# Patient Record
Sex: Male | Born: 1960 | Race: Black or African American | Hispanic: No | Smoking: Never smoker
Health system: Southern US, Community
[De-identification: ages and names within clinical notes are randomized; demographics above are authoritative.]

## PROBLEM LIST (undated history)

## (undated) ENCOUNTER — Emergency Department (HOSPITAL_COMMUNITY): Admission: EM | Payer: Medicaid Other | Source: Home / Self Care

## (undated) DIAGNOSIS — T4145XA Adverse effect of unspecified anesthetic, initial encounter: Secondary | ICD-10-CM

## (undated) DIAGNOSIS — C73 Malignant neoplasm of thyroid gland: Secondary | ICD-10-CM

## (undated) DIAGNOSIS — R079 Chest pain, unspecified: Secondary | ICD-10-CM

## (undated) DIAGNOSIS — Z8489 Family history of other specified conditions: Secondary | ICD-10-CM

## (undated) DIAGNOSIS — I10 Essential (primary) hypertension: Secondary | ICD-10-CM

## (undated) DIAGNOSIS — Z95810 Presence of automatic (implantable) cardiac defibrillator: Secondary | ICD-10-CM

## (undated) DIAGNOSIS — J42 Unspecified chronic bronchitis: Secondary | ICD-10-CM

## (undated) DIAGNOSIS — I219 Acute myocardial infarction, unspecified: Secondary | ICD-10-CM

## (undated) DIAGNOSIS — M545 Low back pain, unspecified: Secondary | ICD-10-CM

## (undated) DIAGNOSIS — F419 Anxiety disorder, unspecified: Secondary | ICD-10-CM

## (undated) DIAGNOSIS — J45909 Unspecified asthma, uncomplicated: Secondary | ICD-10-CM

## (undated) DIAGNOSIS — I5022 Chronic systolic (congestive) heart failure: Secondary | ICD-10-CM

## (undated) DIAGNOSIS — D649 Anemia, unspecified: Secondary | ICD-10-CM

## (undated) DIAGNOSIS — R011 Cardiac murmur, unspecified: Secondary | ICD-10-CM

## (undated) DIAGNOSIS — N189 Chronic kidney disease, unspecified: Secondary | ICD-10-CM

## (undated) DIAGNOSIS — K219 Gastro-esophageal reflux disease without esophagitis: Secondary | ICD-10-CM

## (undated) DIAGNOSIS — E785 Hyperlipidemia, unspecified: Secondary | ICD-10-CM

## (undated) DIAGNOSIS — Z9889 Other specified postprocedural states: Secondary | ICD-10-CM

## (undated) DIAGNOSIS — I209 Angina pectoris, unspecified: Secondary | ICD-10-CM

## (undated) DIAGNOSIS — I2510 Atherosclerotic heart disease of native coronary artery without angina pectoris: Secondary | ICD-10-CM

## (undated) DIAGNOSIS — G8929 Other chronic pain: Secondary | ICD-10-CM

## (undated) DIAGNOSIS — R112 Nausea with vomiting, unspecified: Secondary | ICD-10-CM

## (undated) DIAGNOSIS — T8859XA Other complications of anesthesia, initial encounter: Secondary | ICD-10-CM

## (undated) DIAGNOSIS — R0600 Dyspnea, unspecified: Secondary | ICD-10-CM

## (undated) DIAGNOSIS — I509 Heart failure, unspecified: Secondary | ICD-10-CM

## (undated) DIAGNOSIS — G4730 Sleep apnea, unspecified: Secondary | ICD-10-CM

## (undated) DIAGNOSIS — I6529 Occlusion and stenosis of unspecified carotid artery: Secondary | ICD-10-CM

## (undated) DIAGNOSIS — M1990 Unspecified osteoarthritis, unspecified site: Secondary | ICD-10-CM

## (undated) DIAGNOSIS — I7103 Dissection of thoracoabdominal aorta: Secondary | ICD-10-CM

## (undated) HISTORY — PX: OTHER SURGICAL HISTORY: SHX169

## (undated) HISTORY — DX: Chronic kidney disease, unspecified: N18.9

## (undated) HISTORY — DX: Dissection of thoracoabdominal aorta: I71.03

## (undated) HISTORY — PX: VASECTOMY: SHX75

## (undated) HISTORY — PX: CORONARY ANGIOPLASTY WITH STENT PLACEMENT: SHX49

## (undated) HISTORY — PX: CARDIAC CATHETERIZATION: SHX172

## (undated) HISTORY — DX: Hyperlipidemia, unspecified: E78.5

## (undated) HISTORY — PX: TONSILLECTOMY: SUR1361

## (undated) HISTORY — DX: Malignant neoplasm of thyroid gland: C73

## (undated) HISTORY — DX: Presence of automatic (implantable) cardiac defibrillator: Z95.810

## (undated) HISTORY — DX: Atherosclerotic heart disease of native coronary artery without angina pectoris: I25.10

## (undated) HISTORY — DX: Occlusion and stenosis of unspecified carotid artery: I65.29

## (undated) HISTORY — DX: Anemia, unspecified: D64.9

## (undated) HISTORY — DX: Essential (primary) hypertension: I10

## (undated) HISTORY — DX: Chronic systolic (congestive) heart failure: I50.22

## (undated) HISTORY — PX: BACK SURGERY: SHX140

## (undated) HISTORY — PX: SHOULDER ARTHROSCOPY WITH ROTATOR CUFF REPAIR: SHX5685

## (undated) HISTORY — PX: TESTICLE SURGERY: SHX794

---

## 1998-06-06 ENCOUNTER — Emergency Department (HOSPITAL_COMMUNITY): Admission: EM | Admit: 1998-06-06 | Discharge: 1998-06-06 | Payer: Self-pay | Admitting: Emergency Medicine

## 1999-09-04 ENCOUNTER — Emergency Department (HOSPITAL_COMMUNITY): Admission: EM | Admit: 1999-09-04 | Discharge: 1999-09-04 | Payer: Self-pay | Admitting: Emergency Medicine

## 1999-09-04 ENCOUNTER — Encounter: Payer: Self-pay | Admitting: Emergency Medicine

## 2000-08-17 ENCOUNTER — Emergency Department (HOSPITAL_COMMUNITY): Admission: EM | Admit: 2000-08-17 | Discharge: 2000-08-17 | Payer: Self-pay | Admitting: Emergency Medicine

## 2000-08-17 ENCOUNTER — Encounter: Payer: Self-pay | Admitting: Emergency Medicine

## 2000-09-08 ENCOUNTER — Encounter: Payer: Self-pay | Admitting: Emergency Medicine

## 2000-09-08 ENCOUNTER — Encounter: Admission: RE | Admit: 2000-09-08 | Discharge: 2000-09-08 | Payer: Self-pay | Admitting: *Deleted

## 2000-10-09 ENCOUNTER — Ambulatory Visit (HOSPITAL_COMMUNITY): Admission: RE | Admit: 2000-10-09 | Discharge: 2000-10-09 | Payer: Self-pay

## 2000-10-11 ENCOUNTER — Encounter: Admission: RE | Admit: 2000-10-11 | Discharge: 2000-10-11 | Payer: Self-pay | Admitting: Emergency Medicine

## 2000-10-11 ENCOUNTER — Encounter: Payer: Self-pay | Admitting: Emergency Medicine

## 2000-10-12 ENCOUNTER — Emergency Department (HOSPITAL_COMMUNITY): Admission: EM | Admit: 2000-10-12 | Discharge: 2000-10-12 | Payer: Self-pay | Admitting: Emergency Medicine

## 2000-10-23 ENCOUNTER — Ambulatory Visit (HOSPITAL_COMMUNITY): Admission: RE | Admit: 2000-10-23 | Discharge: 2000-10-23 | Payer: Self-pay

## 2000-11-05 ENCOUNTER — Ambulatory Visit (HOSPITAL_COMMUNITY): Admission: RE | Admit: 2000-11-05 | Discharge: 2000-11-05 | Payer: Self-pay

## 2000-11-09 ENCOUNTER — Ambulatory Visit (HOSPITAL_COMMUNITY): Admission: RE | Admit: 2000-11-09 | Discharge: 2000-11-09 | Payer: Self-pay

## 2001-01-04 ENCOUNTER — Encounter: Payer: Self-pay | Admitting: Neurosurgery

## 2001-01-04 ENCOUNTER — Ambulatory Visit (HOSPITAL_COMMUNITY): Admission: RE | Admit: 2001-01-04 | Discharge: 2001-01-04 | Payer: Self-pay | Admitting: Neurosurgery

## 2001-01-07 ENCOUNTER — Emergency Department (HOSPITAL_COMMUNITY): Admission: EM | Admit: 2001-01-07 | Discharge: 2001-01-07 | Payer: Self-pay | Admitting: *Deleted

## 2001-01-25 HISTORY — PX: LUMBAR DISC SURGERY: SHX700

## 2001-01-31 ENCOUNTER — Encounter: Payer: Self-pay | Admitting: Neurosurgery

## 2001-02-05 ENCOUNTER — Ambulatory Visit (HOSPITAL_COMMUNITY): Admission: AD | Admit: 2001-02-05 | Discharge: 2001-02-06 | Payer: Self-pay | Admitting: Neurosurgery

## 2001-02-05 ENCOUNTER — Encounter: Payer: Self-pay | Admitting: Neurosurgery

## 2001-02-12 ENCOUNTER — Emergency Department (HOSPITAL_COMMUNITY): Admission: EM | Admit: 2001-02-12 | Discharge: 2001-02-12 | Payer: Self-pay | Admitting: Unknown Physician Specialty

## 2001-10-01 ENCOUNTER — Encounter: Admission: RE | Admit: 2001-10-01 | Discharge: 2001-10-01 | Payer: Self-pay | Admitting: Emergency Medicine

## 2001-10-01 ENCOUNTER — Encounter: Payer: Self-pay | Admitting: Emergency Medicine

## 2001-12-31 ENCOUNTER — Ambulatory Visit (HOSPITAL_BASED_OUTPATIENT_CLINIC_OR_DEPARTMENT_OTHER): Admission: RE | Admit: 2001-12-31 | Discharge: 2001-12-31 | Payer: Self-pay | Admitting: Orthopedic Surgery

## 2002-01-01 ENCOUNTER — Emergency Department (HOSPITAL_COMMUNITY): Admission: EM | Admit: 2002-01-01 | Discharge: 2002-01-01 | Payer: Self-pay | Admitting: Emergency Medicine

## 2002-08-12 ENCOUNTER — Emergency Department (HOSPITAL_COMMUNITY): Admission: EM | Admit: 2002-08-12 | Discharge: 2002-08-12 | Payer: Self-pay | Admitting: Emergency Medicine

## 2002-08-12 ENCOUNTER — Encounter: Payer: Self-pay | Admitting: Emergency Medicine

## 2003-01-24 ENCOUNTER — Encounter: Payer: Self-pay | Admitting: Emergency Medicine

## 2003-01-24 ENCOUNTER — Emergency Department (HOSPITAL_COMMUNITY): Admission: EM | Admit: 2003-01-24 | Discharge: 2003-01-24 | Payer: Self-pay

## 2003-02-23 ENCOUNTER — Encounter: Payer: Self-pay | Admitting: Emergency Medicine

## 2003-02-23 ENCOUNTER — Inpatient Hospital Stay (HOSPITAL_COMMUNITY): Admission: EM | Admit: 2003-02-23 | Discharge: 2003-03-04 | Payer: Self-pay | Admitting: Emergency Medicine

## 2003-03-02 ENCOUNTER — Encounter: Payer: Self-pay | Admitting: Neurosurgery

## 2003-07-22 ENCOUNTER — Encounter: Payer: Self-pay | Admitting: Emergency Medicine

## 2003-07-22 ENCOUNTER — Encounter: Admission: RE | Admit: 2003-07-22 | Discharge: 2003-07-22 | Payer: Self-pay | Admitting: Emergency Medicine

## 2004-06-23 ENCOUNTER — Emergency Department (HOSPITAL_COMMUNITY): Admission: EM | Admit: 2004-06-23 | Discharge: 2004-06-23 | Payer: Self-pay | Admitting: Emergency Medicine

## 2005-06-29 ENCOUNTER — Inpatient Hospital Stay (HOSPITAL_COMMUNITY): Admission: RE | Admit: 2005-06-29 | Discharge: 2005-07-05 | Payer: Self-pay | Admitting: Surgery

## 2005-06-29 HISTORY — PX: CORONARY ARTERY BYPASS GRAFT: SHX141

## 2005-07-03 ENCOUNTER — Ambulatory Visit: Payer: Self-pay | Admitting: Cardiology

## 2005-07-12 ENCOUNTER — Inpatient Hospital Stay (HOSPITAL_COMMUNITY): Admission: EM | Admit: 2005-07-12 | Discharge: 2005-07-15 | Payer: Self-pay | Admitting: Emergency Medicine

## 2005-07-14 ENCOUNTER — Encounter: Payer: Self-pay | Admitting: Cardiology

## 2005-07-24 ENCOUNTER — Ambulatory Visit: Payer: Self-pay | Admitting: Cardiology

## 2005-08-04 ENCOUNTER — Ambulatory Visit: Payer: Self-pay | Admitting: Cardiology

## 2005-08-04 ENCOUNTER — Encounter: Payer: Self-pay | Admitting: Cardiology

## 2005-08-04 ENCOUNTER — Inpatient Hospital Stay (HOSPITAL_COMMUNITY): Admission: EM | Admit: 2005-08-04 | Discharge: 2005-08-08 | Payer: Self-pay | Admitting: Emergency Medicine

## 2005-08-10 ENCOUNTER — Ambulatory Visit: Payer: Self-pay | Admitting: Internal Medicine

## 2005-08-17 ENCOUNTER — Ambulatory Visit: Payer: Self-pay | Admitting: Internal Medicine

## 2005-08-21 ENCOUNTER — Ambulatory Visit (HOSPITAL_COMMUNITY): Admission: RE | Admit: 2005-08-21 | Discharge: 2005-08-21 | Payer: Self-pay | Admitting: Internal Medicine

## 2005-08-24 ENCOUNTER — Ambulatory Visit: Payer: Self-pay | Admitting: Internal Medicine

## 2005-08-29 ENCOUNTER — Ambulatory Visit: Admission: RE | Admit: 2005-08-29 | Discharge: 2005-08-29 | Payer: Self-pay | Admitting: Internal Medicine

## 2005-09-04 ENCOUNTER — Ambulatory Visit: Payer: Self-pay | Admitting: Cardiology

## 2005-09-07 ENCOUNTER — Ambulatory Visit: Payer: Self-pay | Admitting: Cardiology

## 2005-09-11 ENCOUNTER — Ambulatory Visit: Payer: Self-pay | Admitting: Cardiology

## 2005-10-04 ENCOUNTER — Ambulatory Visit: Payer: Self-pay | Admitting: Internal Medicine

## 2005-10-13 ENCOUNTER — Ambulatory Visit: Payer: Self-pay

## 2005-10-24 ENCOUNTER — Ambulatory Visit: Payer: Self-pay | Admitting: Internal Medicine

## 2005-11-14 ENCOUNTER — Ambulatory Visit: Payer: Self-pay | Admitting: Internal Medicine

## 2005-11-15 IMAGING — CR DG CHEST 2V
2 series · 2 of 2 positions shown · non-contrast
Comparison: none

CLINICAL DATA: Coronary artery disease.  Pre-op respiratory exam for coronary artery by-pass grafting.
 CHEST ? TWO VIEW:
 Mild cardiomegaly is noted.  Both lungs are clear.  There is no evidence of pleural effusion.  No mass or adenopathy identified.

[view not recorded (1 of 2)]
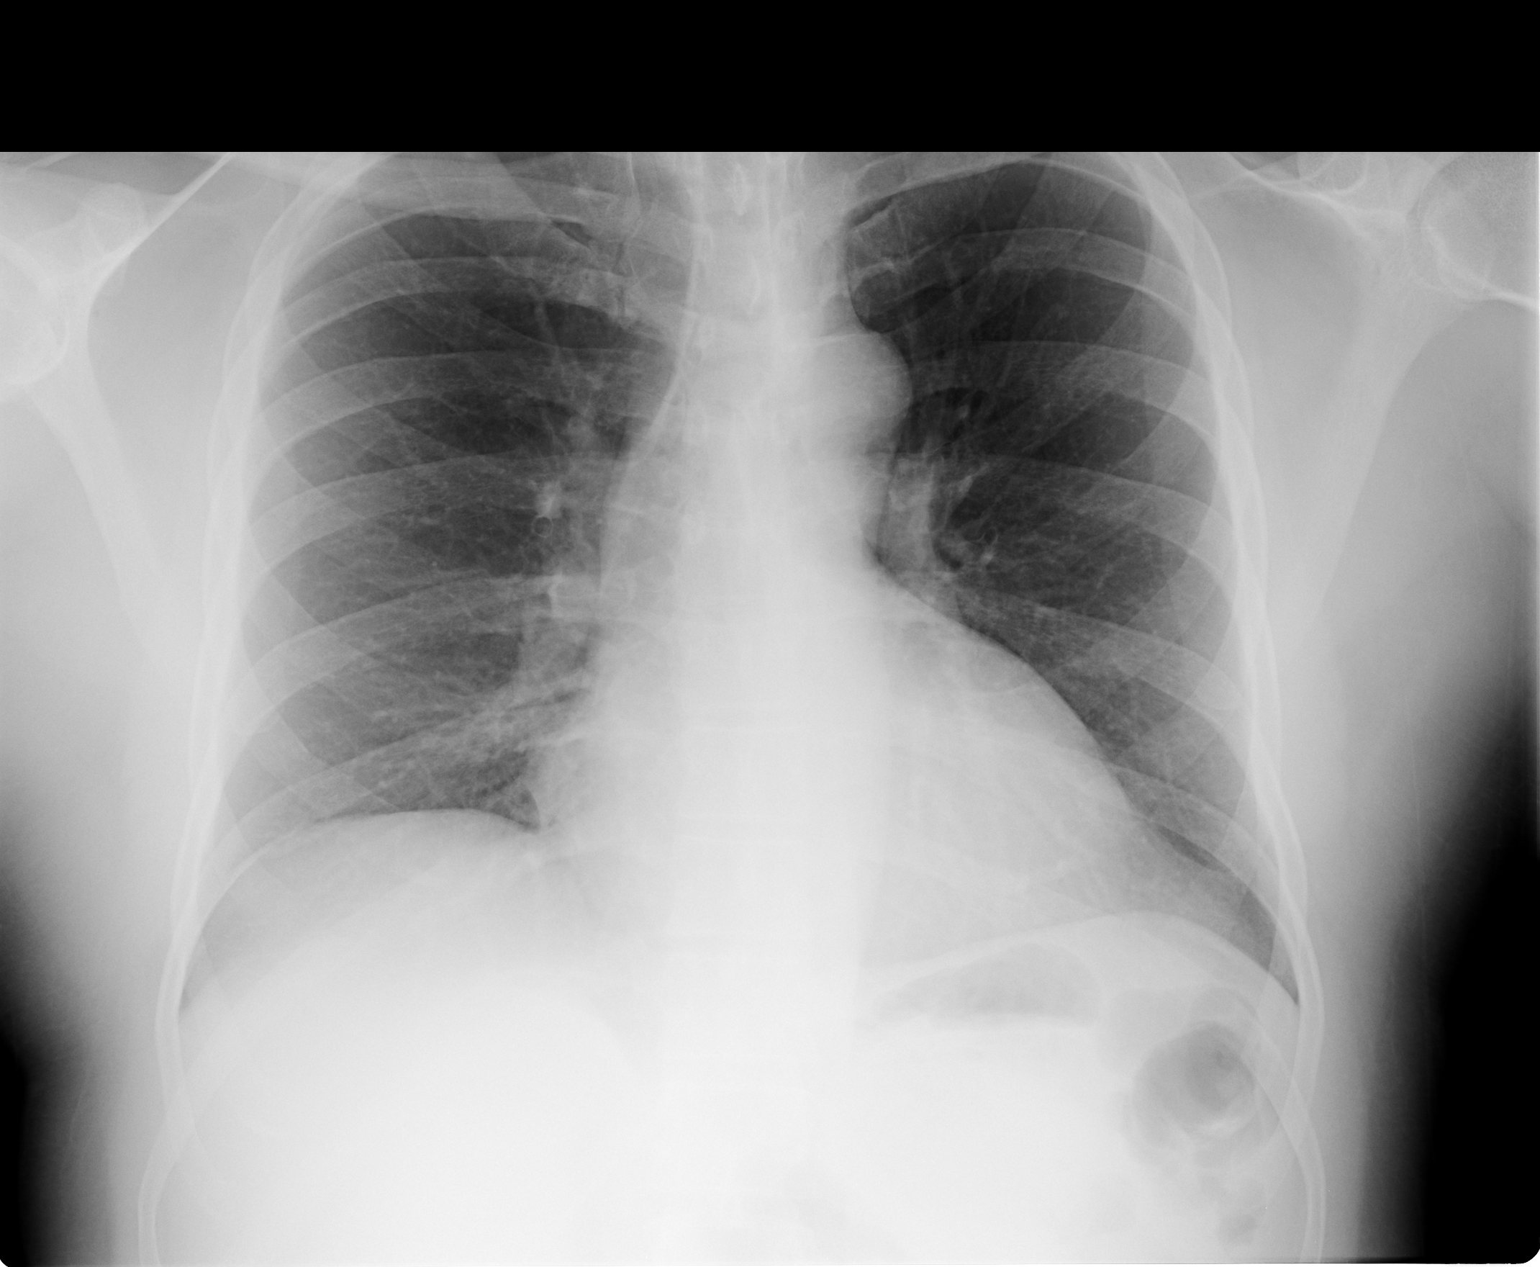

[view not recorded (2 of 2)]
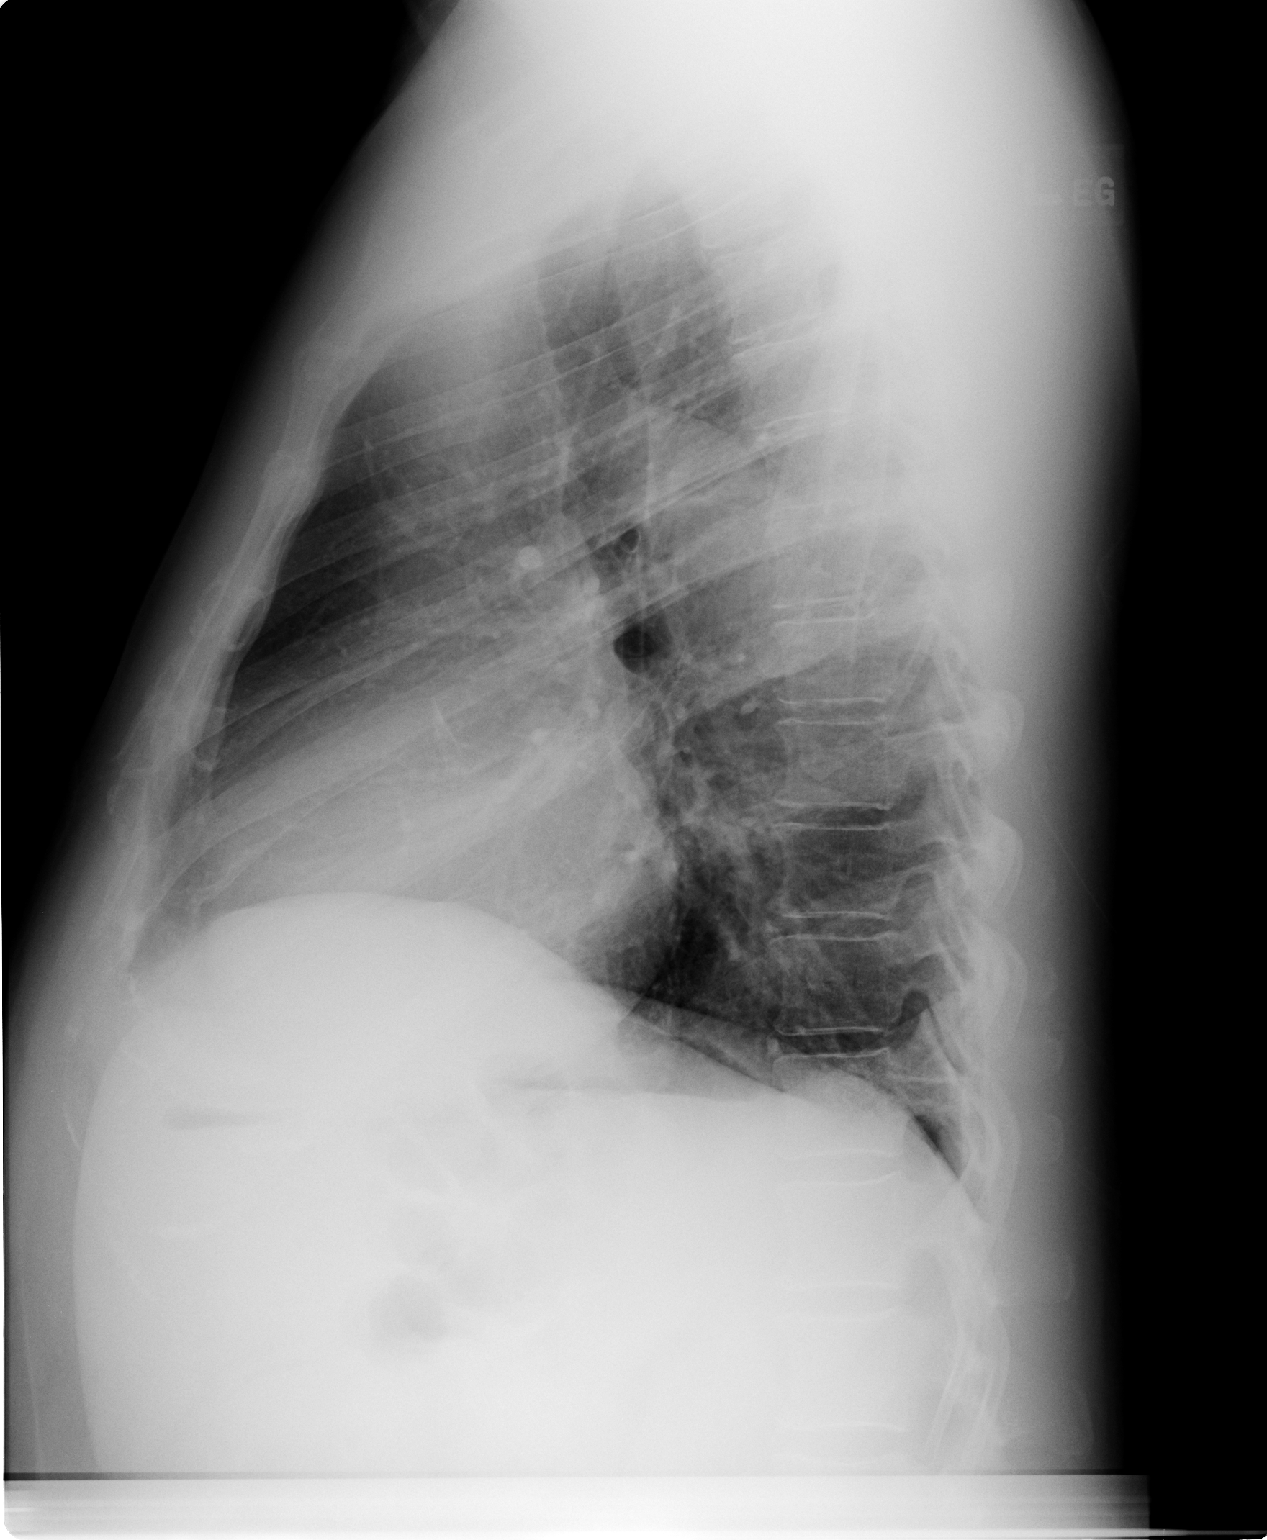

[2 of 2 positions shown; findings below may reference images not displayed]

IMPRESSION: Mild cardiomegaly.  No active disease.

## 2005-11-16 IMAGING — CR DG CHEST 1V PORT
1 series · 1 of 1 positions shown · non-contrast
Comparison: 06/28/05.

CLINICAL DATA: Status-post CABG. 
 PORTABLE CHEST ? 1 VIEW:

[view not recorded]
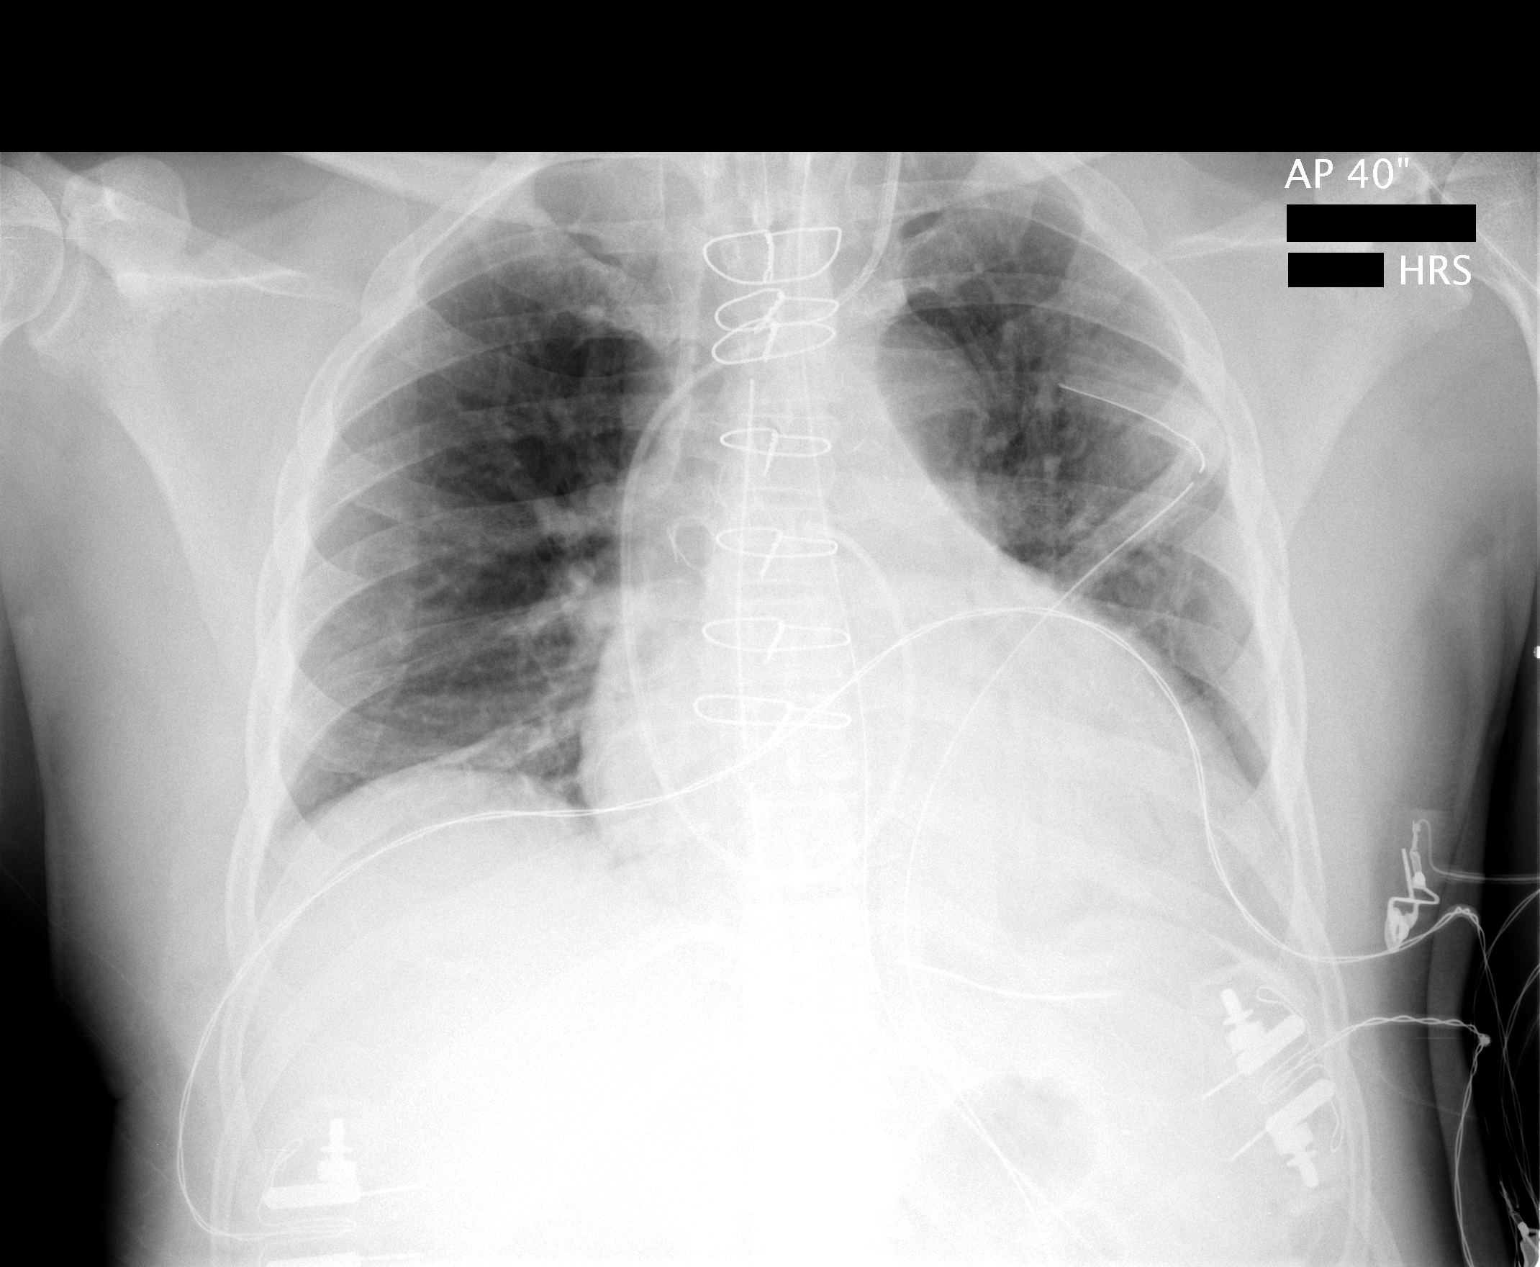

[1 of 1 positions shown; findings below may reference images not displayed]

FINDINGS: There is a left pulmonary arterial catheter with tip in the projection of the main pulmonary artery.  
 ET tube tip is about 51/2 cm above the carina.  
 The sternotomy wires appear to be in place.  There is a mediastinal drain as well as a left chest tube.  An NG tube is also identified the tip of which is not visualized.  
 No focal airspace opacities are identified.  I see no evidence for pneumothorax.
IMPRESSION: Postop chest x-ray status-post CABG.  No complications noted.

## 2005-11-17 IMAGING — CR DG CHEST 1V PORT
1 series · 1 of 1 positions shown · non-contrast
Comparison: 06/29/05.

CLINICAL DATA: Coronary artery disease.
 PORTABLE [id number] VIEW (1461 hours):

[view not recorded]
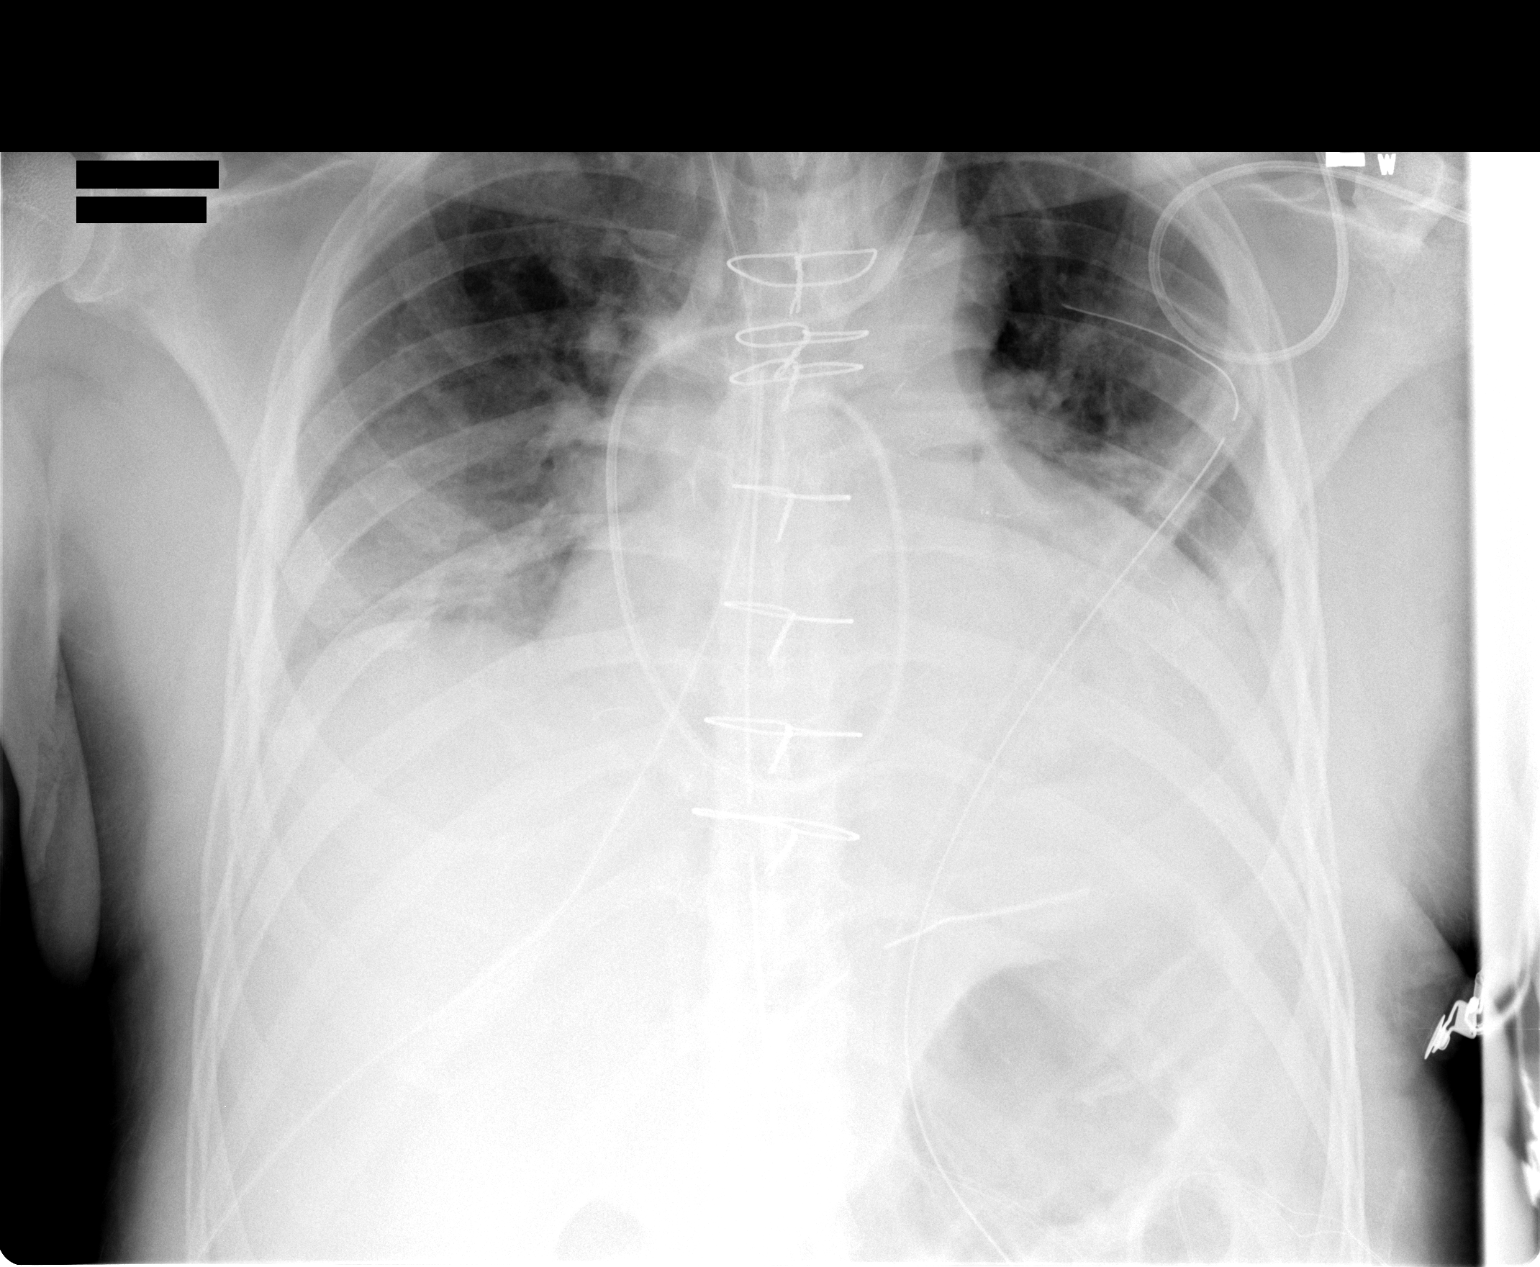

[1 of 1 positions shown; findings below may reference images not displayed]

FINDINGS: The chest and mediastinal tubes are stable without pneumothorax.  The Swan-Ganz catheter is stable.  The endotracheal and NG tubes have been removed.  Bibasilar atelectasis has worsened. Vascular congestion without pulmonary edema has worsened.
IMPRESSION: Endotracheal tube and NG tube removal with worsening vascular congestion and bibasilar atelectasis.  No pneumothorax.

## 2005-11-18 IMAGING — CR DG CHEST 2V
2 series · 2 of 2 positions shown · non-contrast
Comparison: 06/30/05 at [DATE].

CLINICAL DATA: Status post CABG.   
 CHEST - TWO VIEWS:

[view not recorded (1 of 2)]
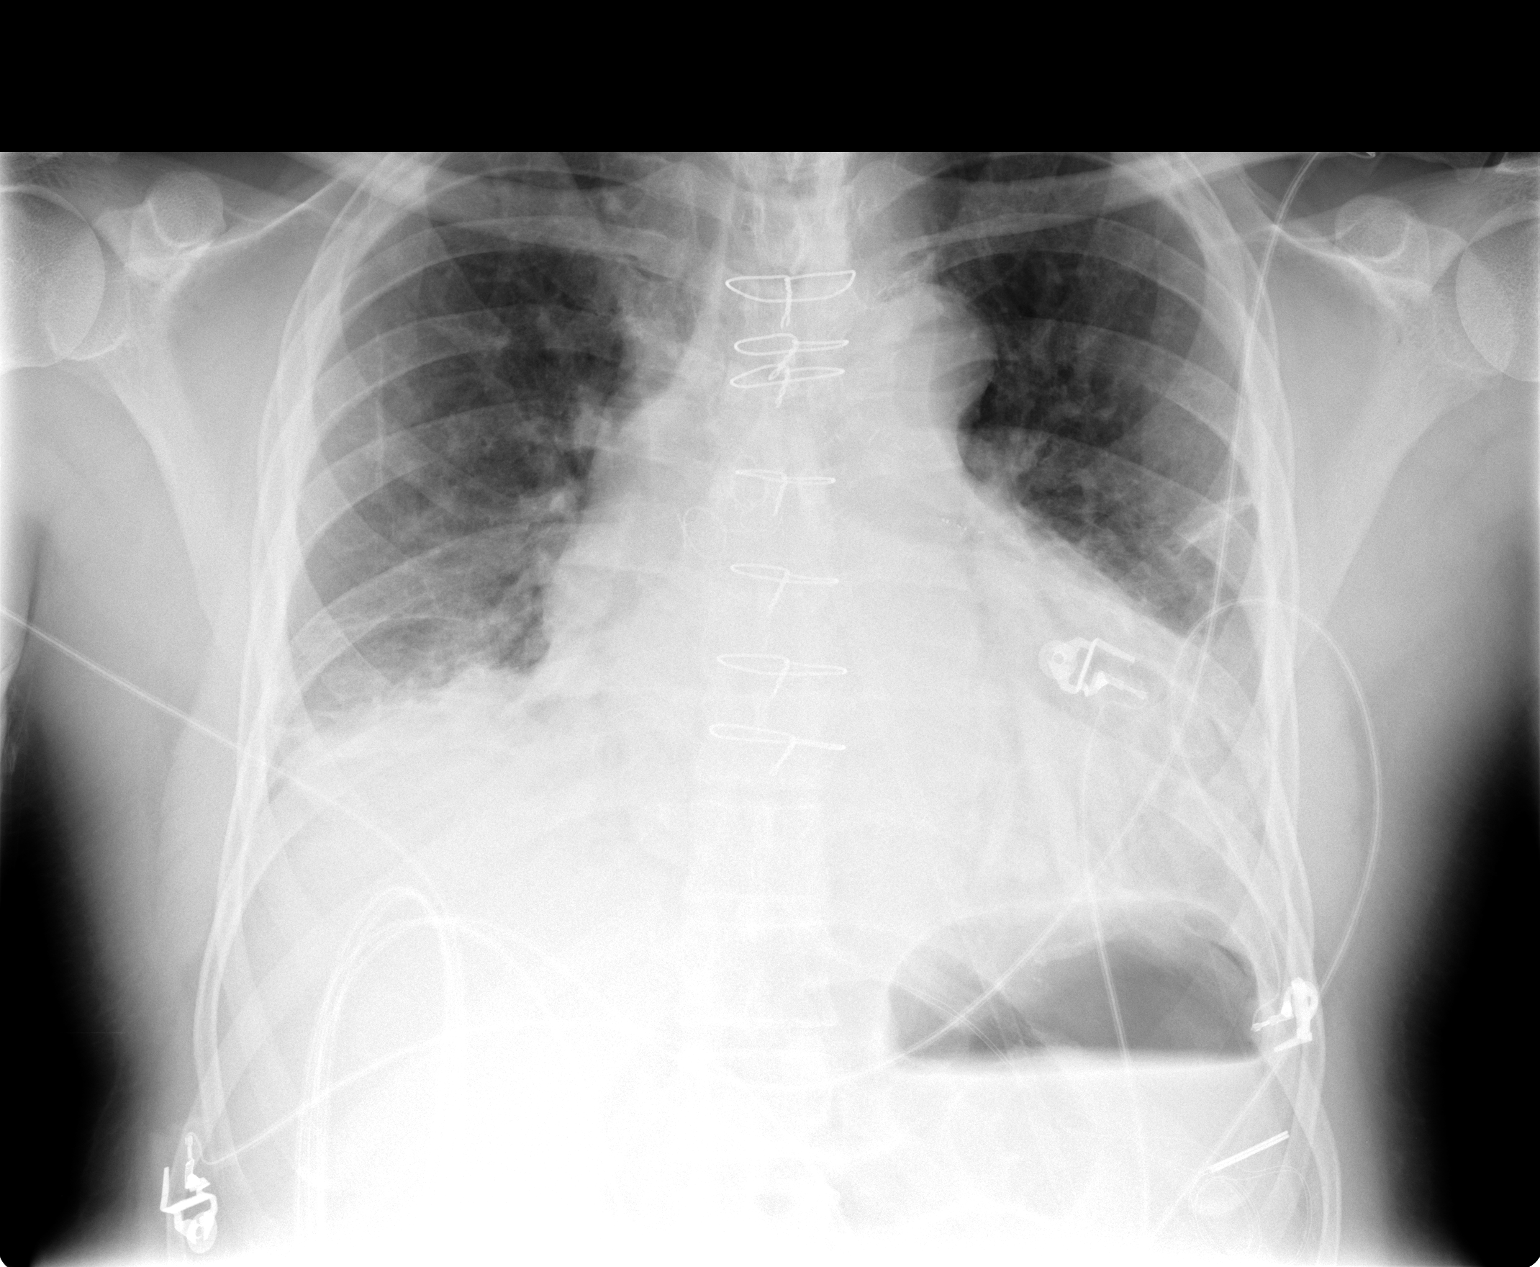

[view not recorded (2 of 2)]
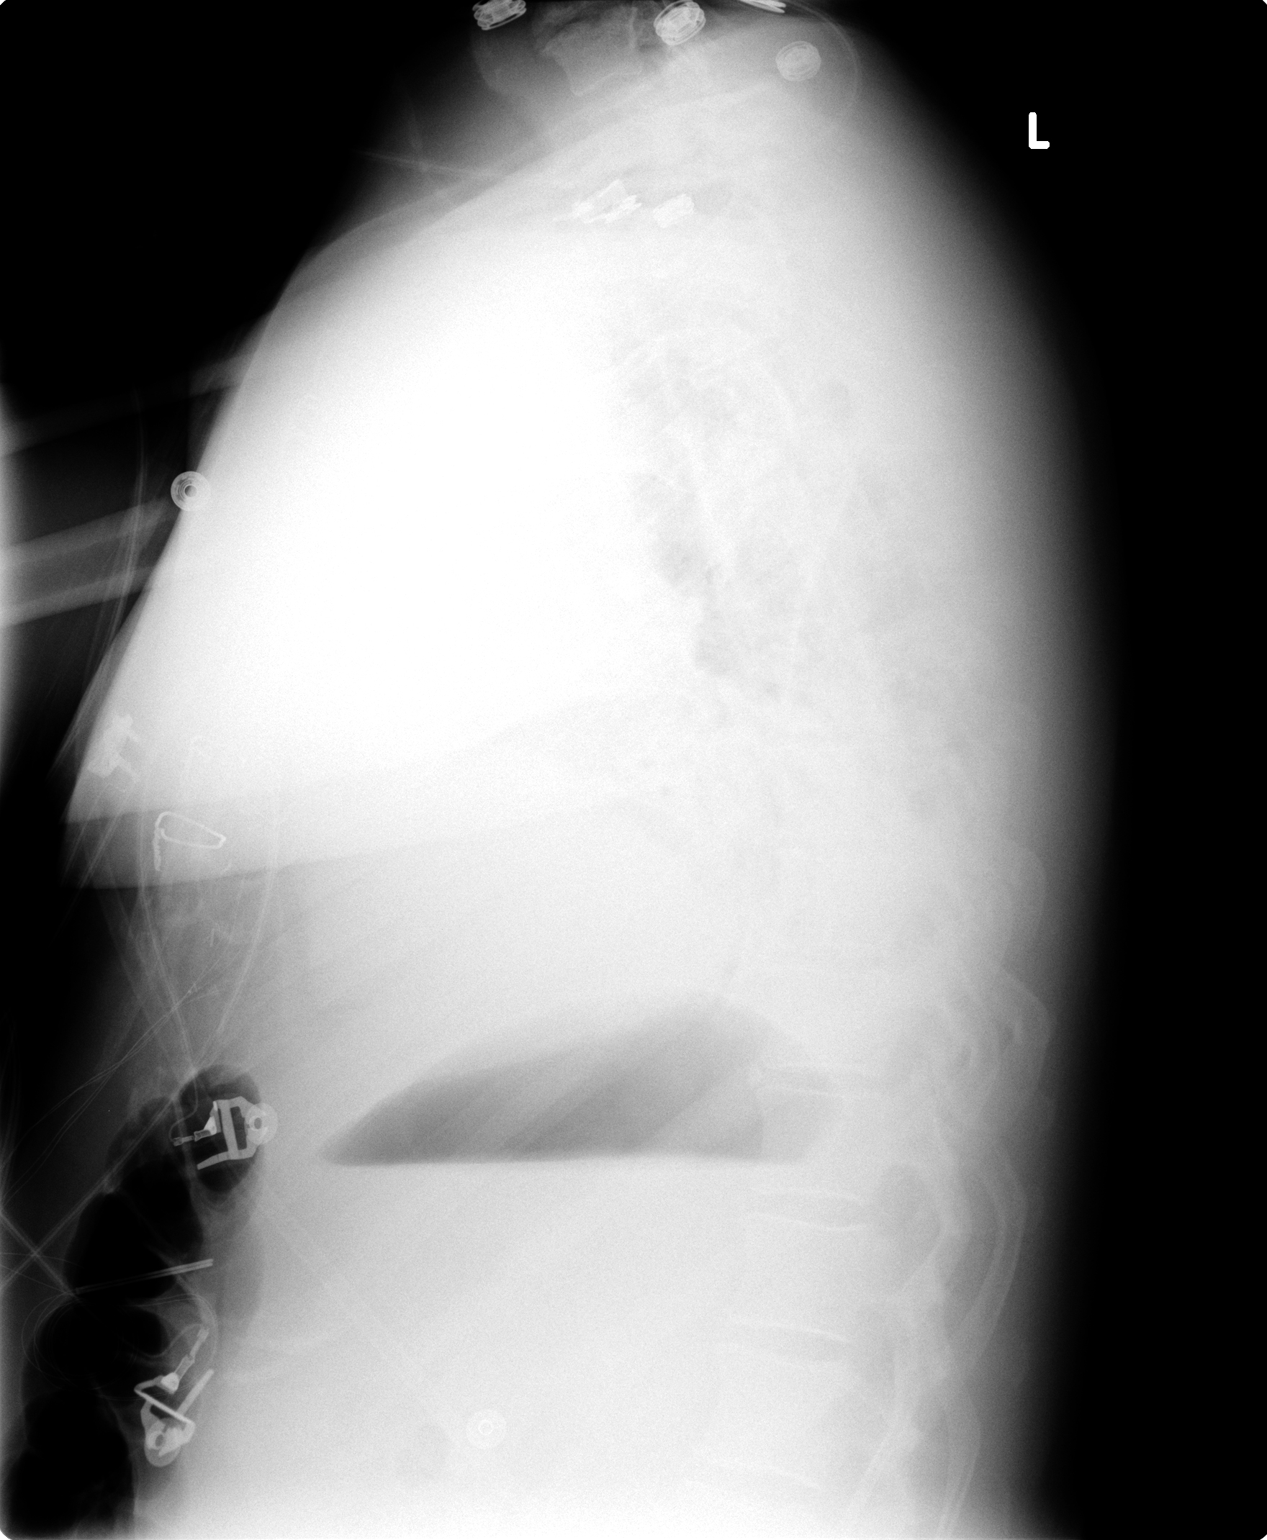

[2 of 2 positions shown; findings below may reference images not displayed]

FINDINGS: There has been interval removal of pulmonary arterial catheter.   There has been also interval removal of left-sided chest tube.   
 Sternotomy wires appear intact.  
 Unchanged cardiomegaly.   Mild pulmonary edema.  
 There is bibasilar atelectasis unchanged in the interval.
IMPRESSION: 1.   Interval removal of pulmonary arterial catheter and chest tube.   No pneumothorax noted.
 2.  Stable bibasilar atelectasis and cardiomegaly.

## 2005-11-21 ENCOUNTER — Ambulatory Visit: Payer: Self-pay | Admitting: Internal Medicine

## 2005-11-21 IMAGING — CR DG CHEST 2V
2 series · 2 of 2 positions shown · non-contrast
Comparison: 07/01/2005

CLINICAL DATA: CABG

CHEST - 2 VIEW:

[w chest pa]
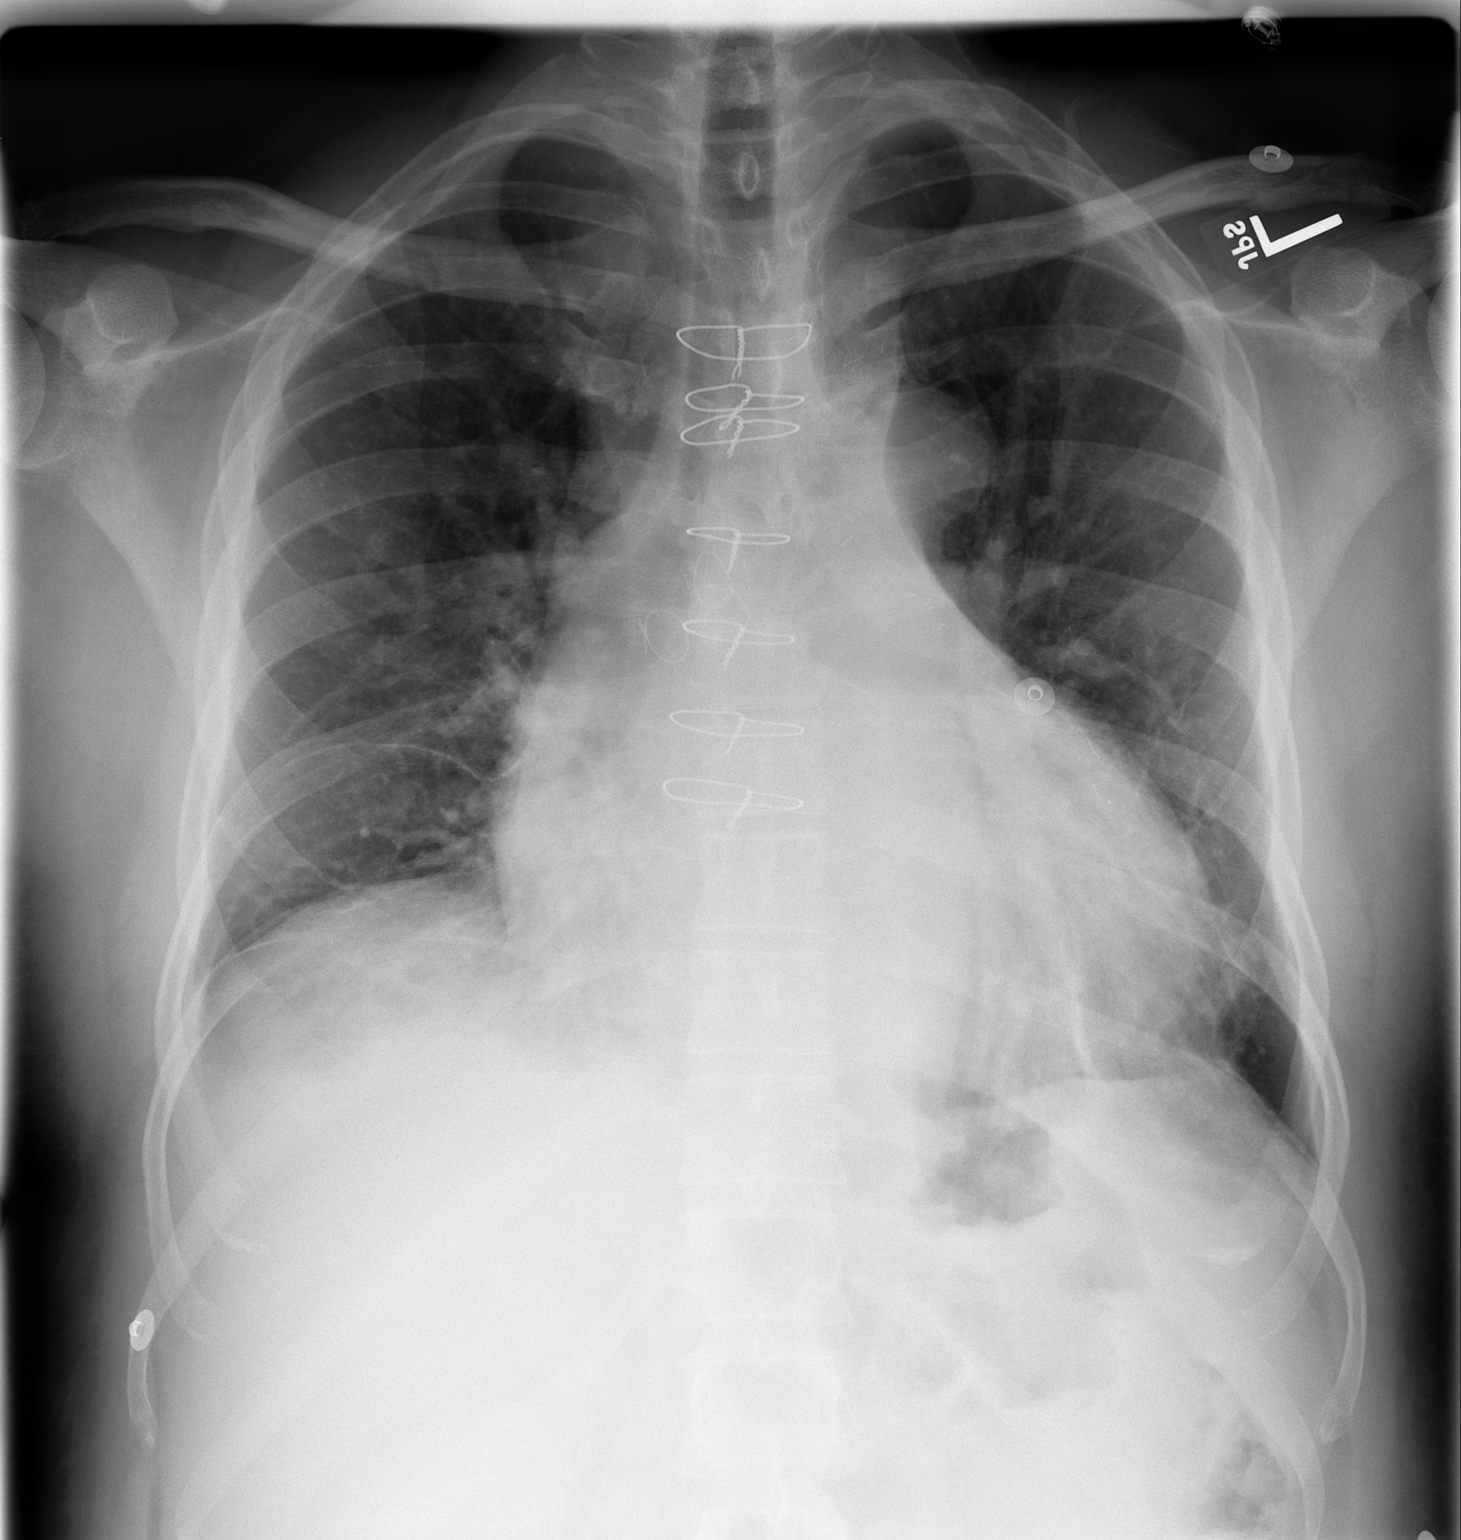

[w chest lat]
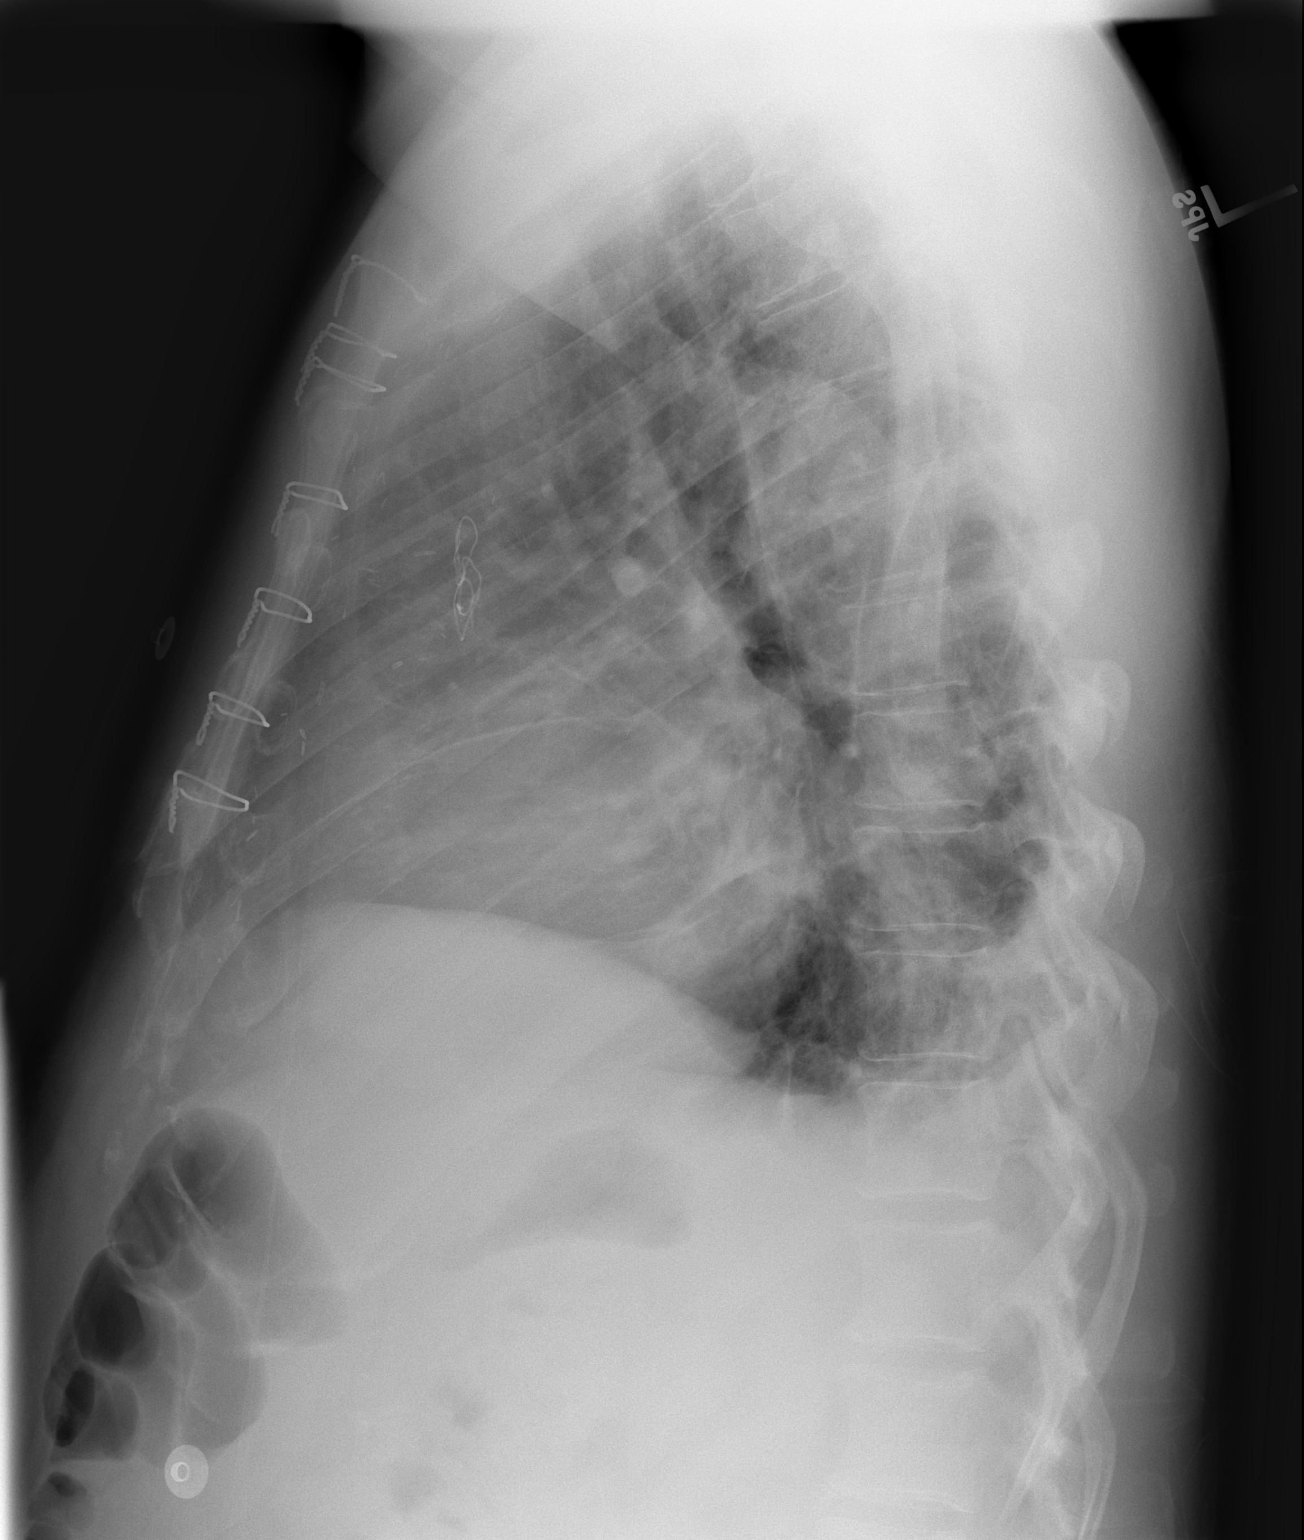

[2 of 2 positions shown; findings below may reference images not displayed]

FINDINGS: There is cardiomegaly. Increasing lung volumes with decreasing
bibasilar atelectasis. Small effusions bilaterally.
IMPRESSION: Improving aeration with decreasing bibasilar atelectasis. Small effusions.

## 2005-11-24 ENCOUNTER — Inpatient Hospital Stay (HOSPITAL_COMMUNITY): Admission: RE | Admit: 2005-11-24 | Discharge: 2005-11-25 | Payer: Self-pay | Admitting: Internal Medicine

## 2005-11-24 ENCOUNTER — Ambulatory Visit: Payer: Self-pay | Admitting: Internal Medicine

## 2005-11-24 HISTORY — PX: CARDIAC DEFIBRILLATOR PLACEMENT: SHX171

## 2005-11-29 IMAGING — CR DG CHEST 1V PORT
1 series · 1 of 1 positions shown · non-contrast
Comparison: 07/04/05.

CLINICAL DATA: Short of breath. 
 CHEST ? 1 VIEW PORTABLE:

[view not recorded]
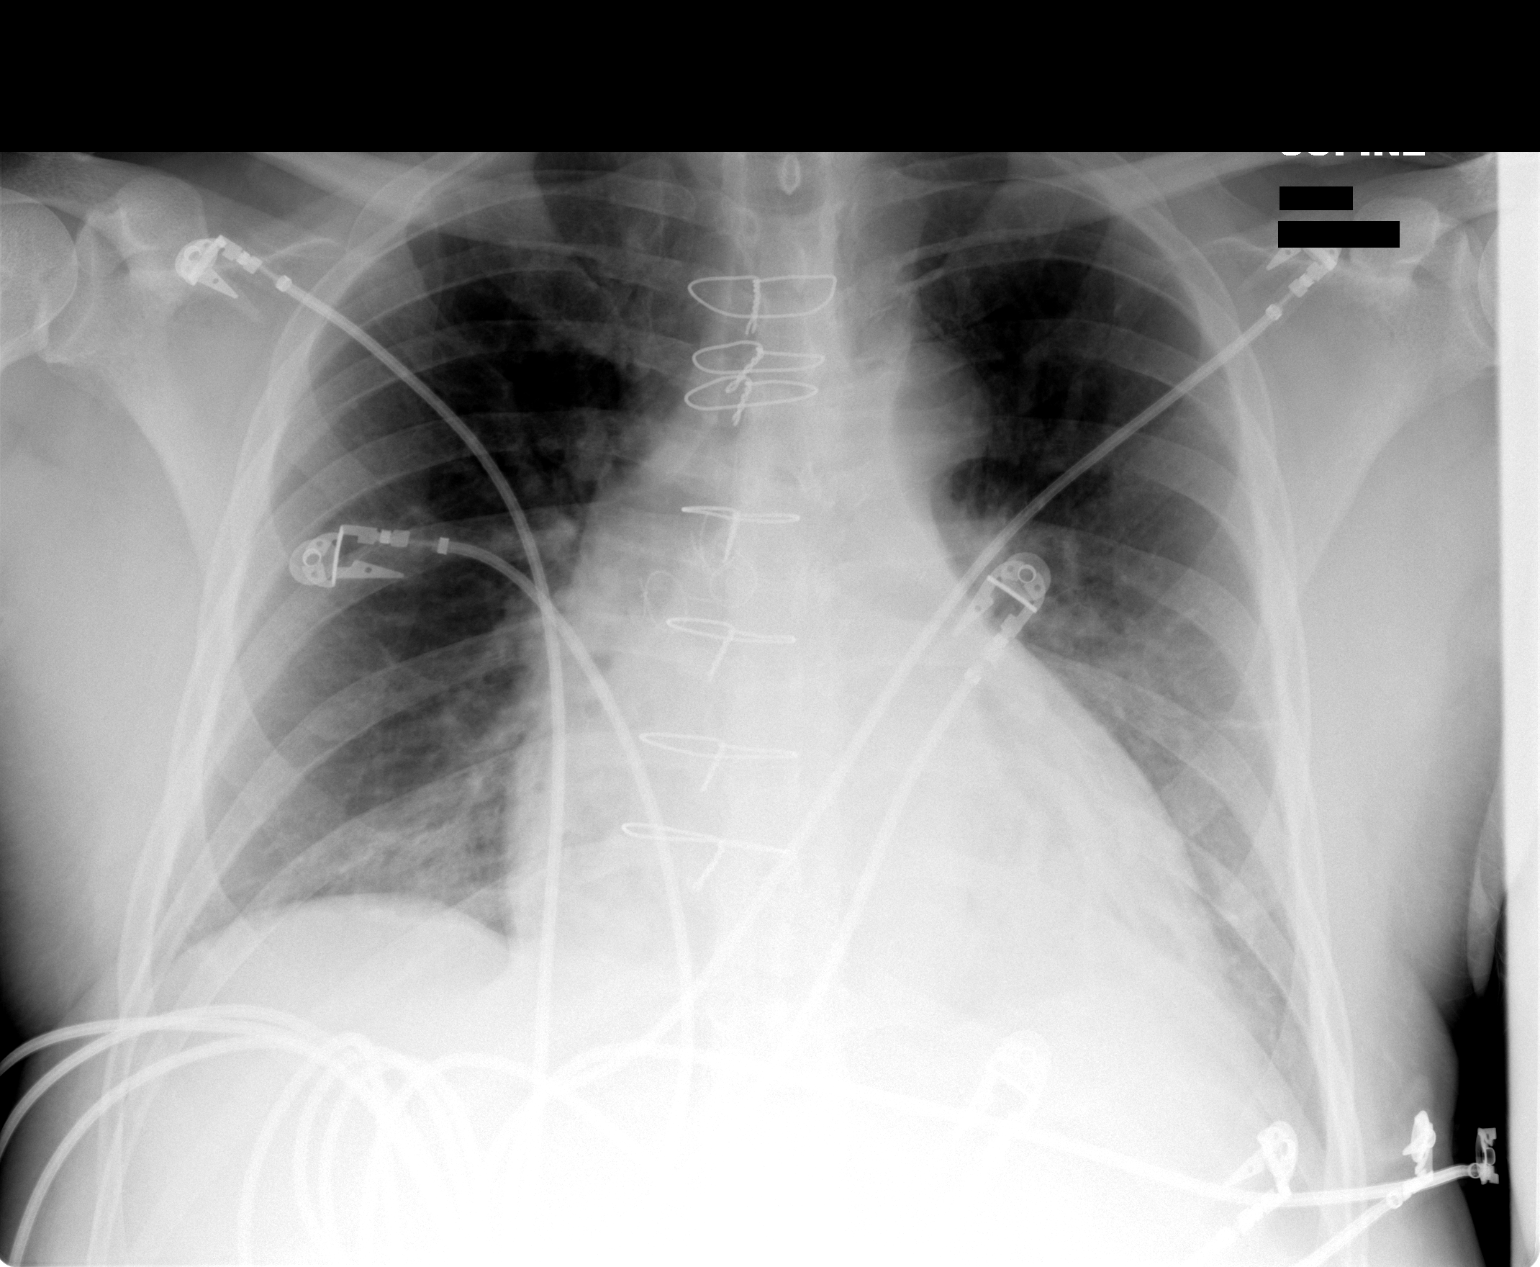

[1 of 1 positions shown; findings below may reference images not displayed]

The patient has had previous median sternotomy and coronary artery bypass grafting.  The heart is enlarged.  There is pulmonary venous hypertension.  There is mild interstitial edema.  There is mild basilar atelectasis.
IMPRESSION: Persistent mild edema and basilar atelectasis.

## 2005-12-22 IMAGING — CR DG CHEST 1V PORT
1 series · 1 of 1 positions shown · non-contrast
Comparison: 07/12/05.

CLINICAL DATA: Chest pain. 
 PORTABLE CHEST - 1 VIEW - 08/04/05:

[view not recorded]
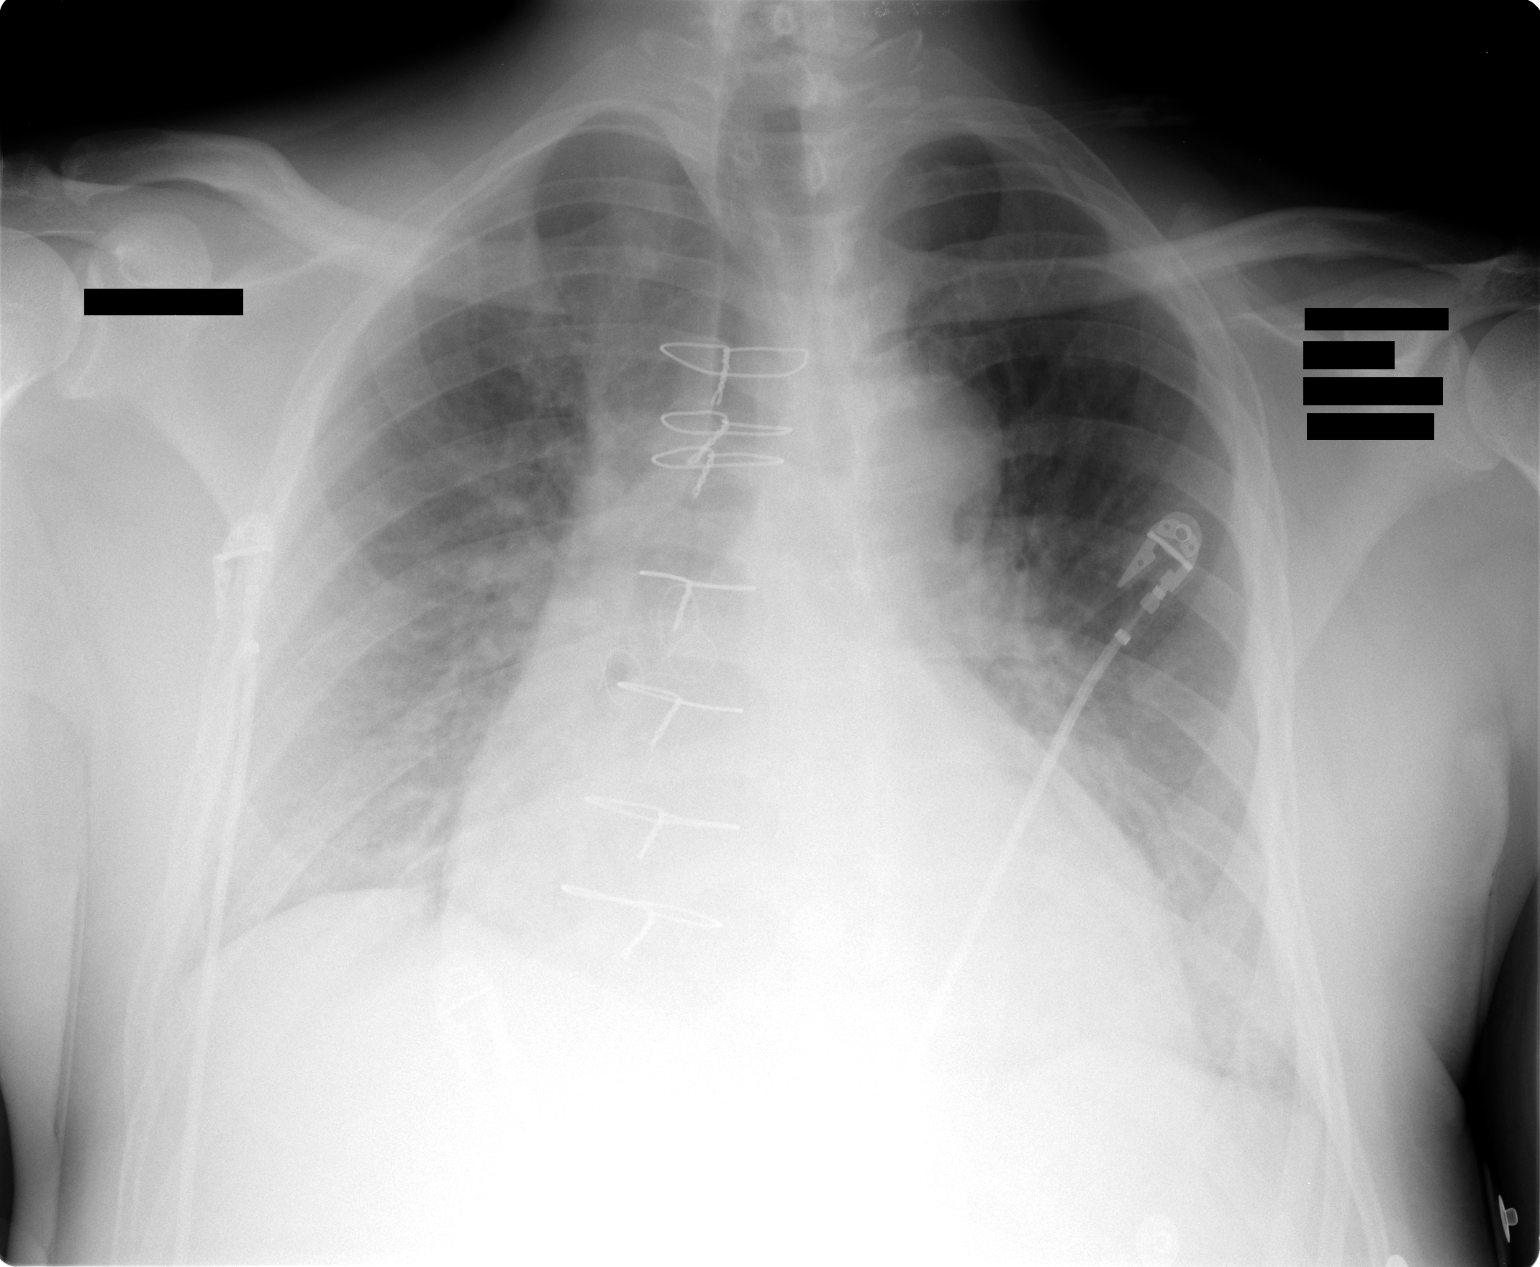

[1 of 1 positions shown; findings below may reference images not displayed]

FINDINGS: There is marked cardiomegaly.  Alveolar and interstitial opacities are compatible with pulmonary edema.  Haziness of the chest likely reflects layering effusions.
IMPRESSION: Congestive heart failure.

## 2006-01-11 ENCOUNTER — Ambulatory Visit: Payer: Self-pay | Admitting: Internal Medicine

## 2006-02-06 ENCOUNTER — Ambulatory Visit: Payer: Self-pay | Admitting: Cardiology

## 2006-03-09 ENCOUNTER — Ambulatory Visit: Payer: Self-pay | Admitting: Internal Medicine

## 2006-03-22 ENCOUNTER — Ambulatory Visit: Payer: Self-pay | Admitting: Internal Medicine

## 2006-03-22 ENCOUNTER — Encounter: Payer: Self-pay | Admitting: Internal Medicine

## 2006-03-22 ENCOUNTER — Ambulatory Visit: Payer: Self-pay

## 2006-03-22 ENCOUNTER — Ambulatory Visit (HOSPITAL_COMMUNITY): Admission: RE | Admit: 2006-03-22 | Discharge: 2006-03-22 | Payer: Self-pay | Admitting: Internal Medicine

## 2006-04-13 IMAGING — CR DG CHEST 2V
2 series · 2 of 2 positions shown · non-contrast
Comparison: 08/04/05.

CLINICAL DATA: Ischemic cardiomyopathy.  Chest pain, asthma, bronchitis, hypertension.
CHEST ? 2 VIEW:

[view not recorded (1 of 2)]
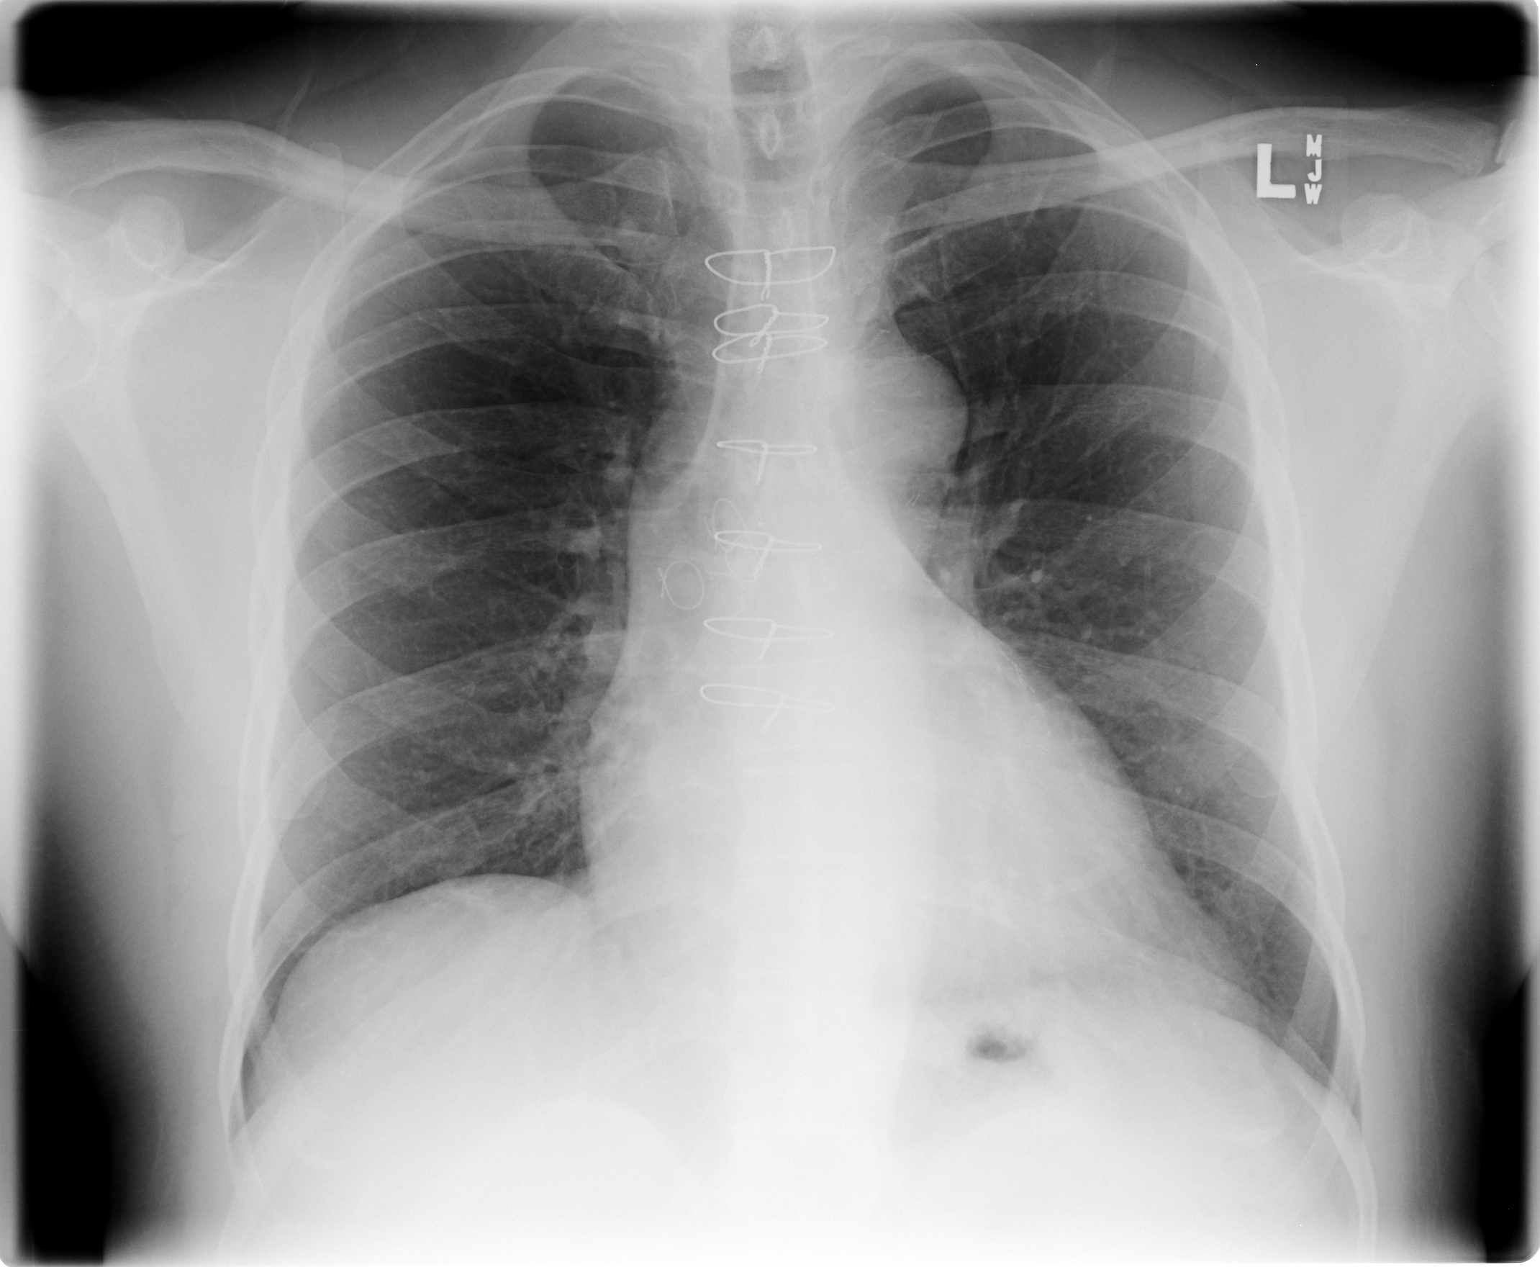

[view not recorded (2 of 2)]
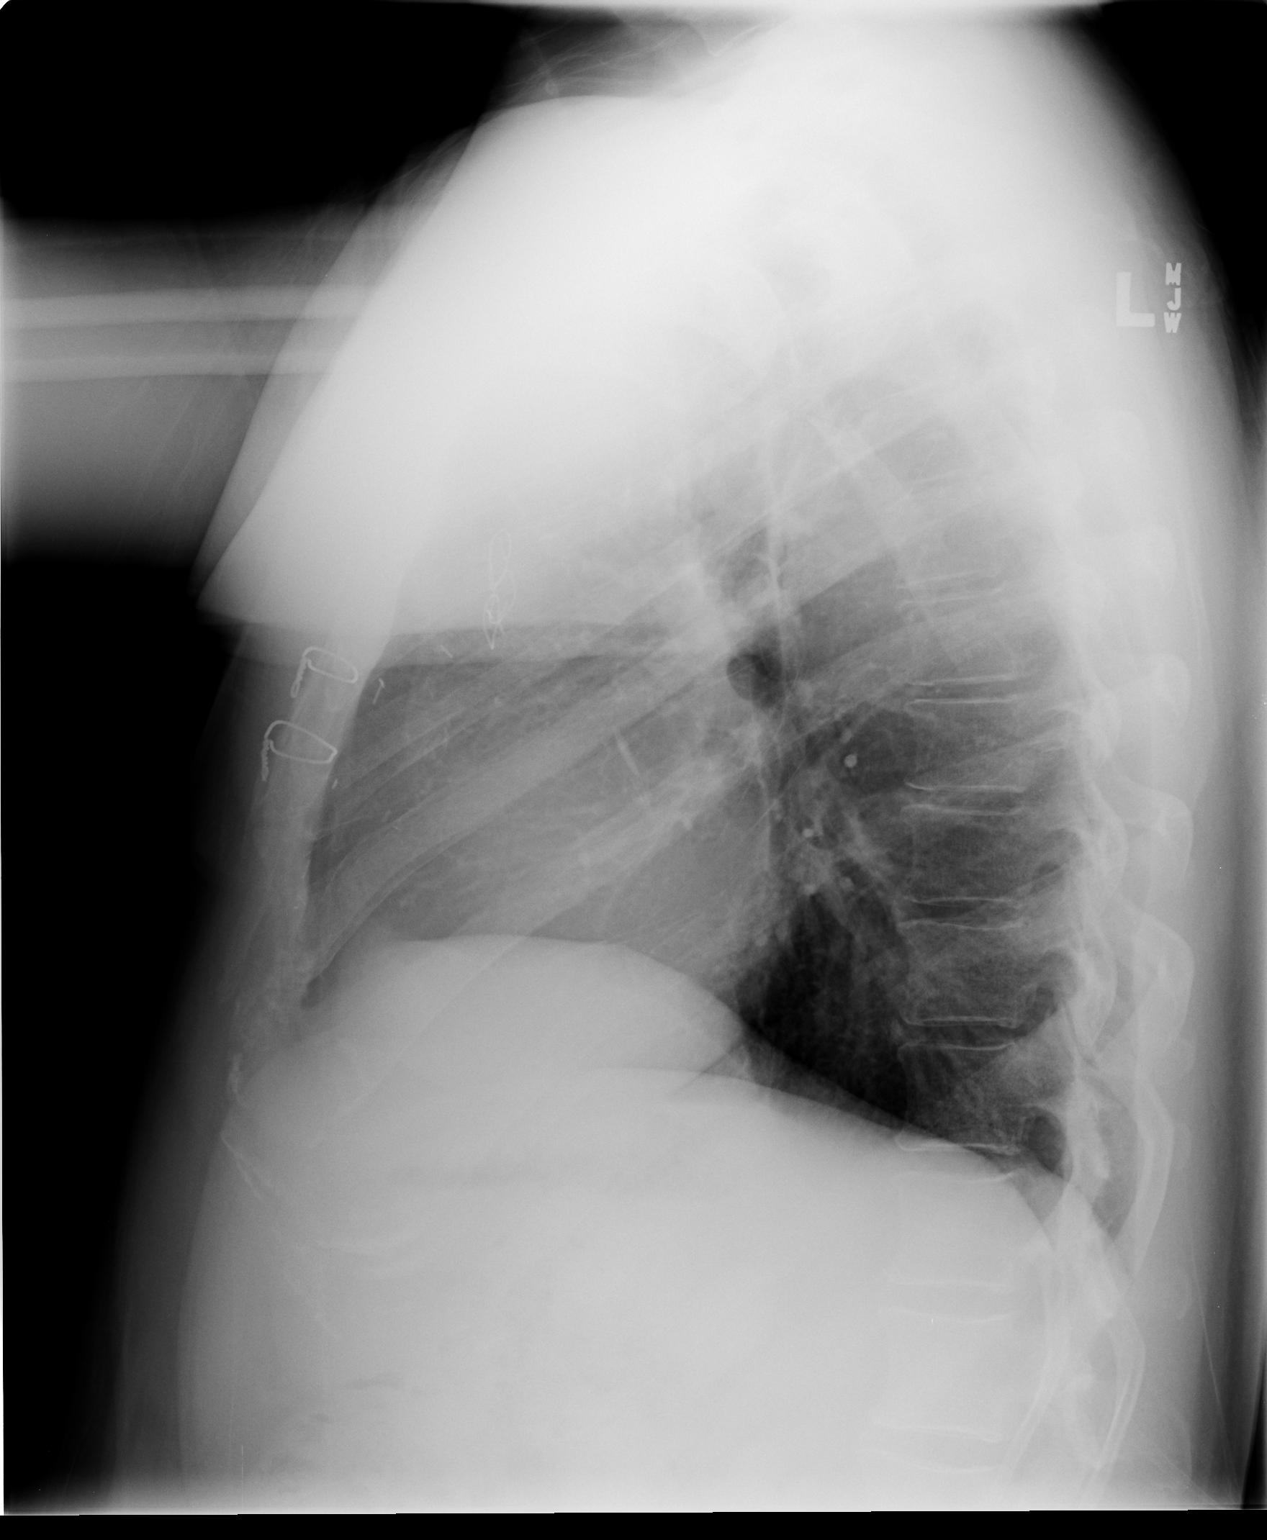

[2 of 2 positions shown; findings below may reference images not displayed]

FINDINGS: Sternal wire sutures and mediastinal clips.  No CHF or acute lung process.  Mild tortuosity and ectasia in thoracic aorta.
IMPRESSION: No acute chest findings.

## 2006-04-14 IMAGING — CR DG CHEST 2V
2 series · 2 of 2 positions shown · non-contrast
Comparison: 11/24/2005.

CLINICAL DATA: Pacemaker placement.
 CHEST - 2 VIEW ? 11/25/2005 ? (4544 HOURS):

[w chest pa]
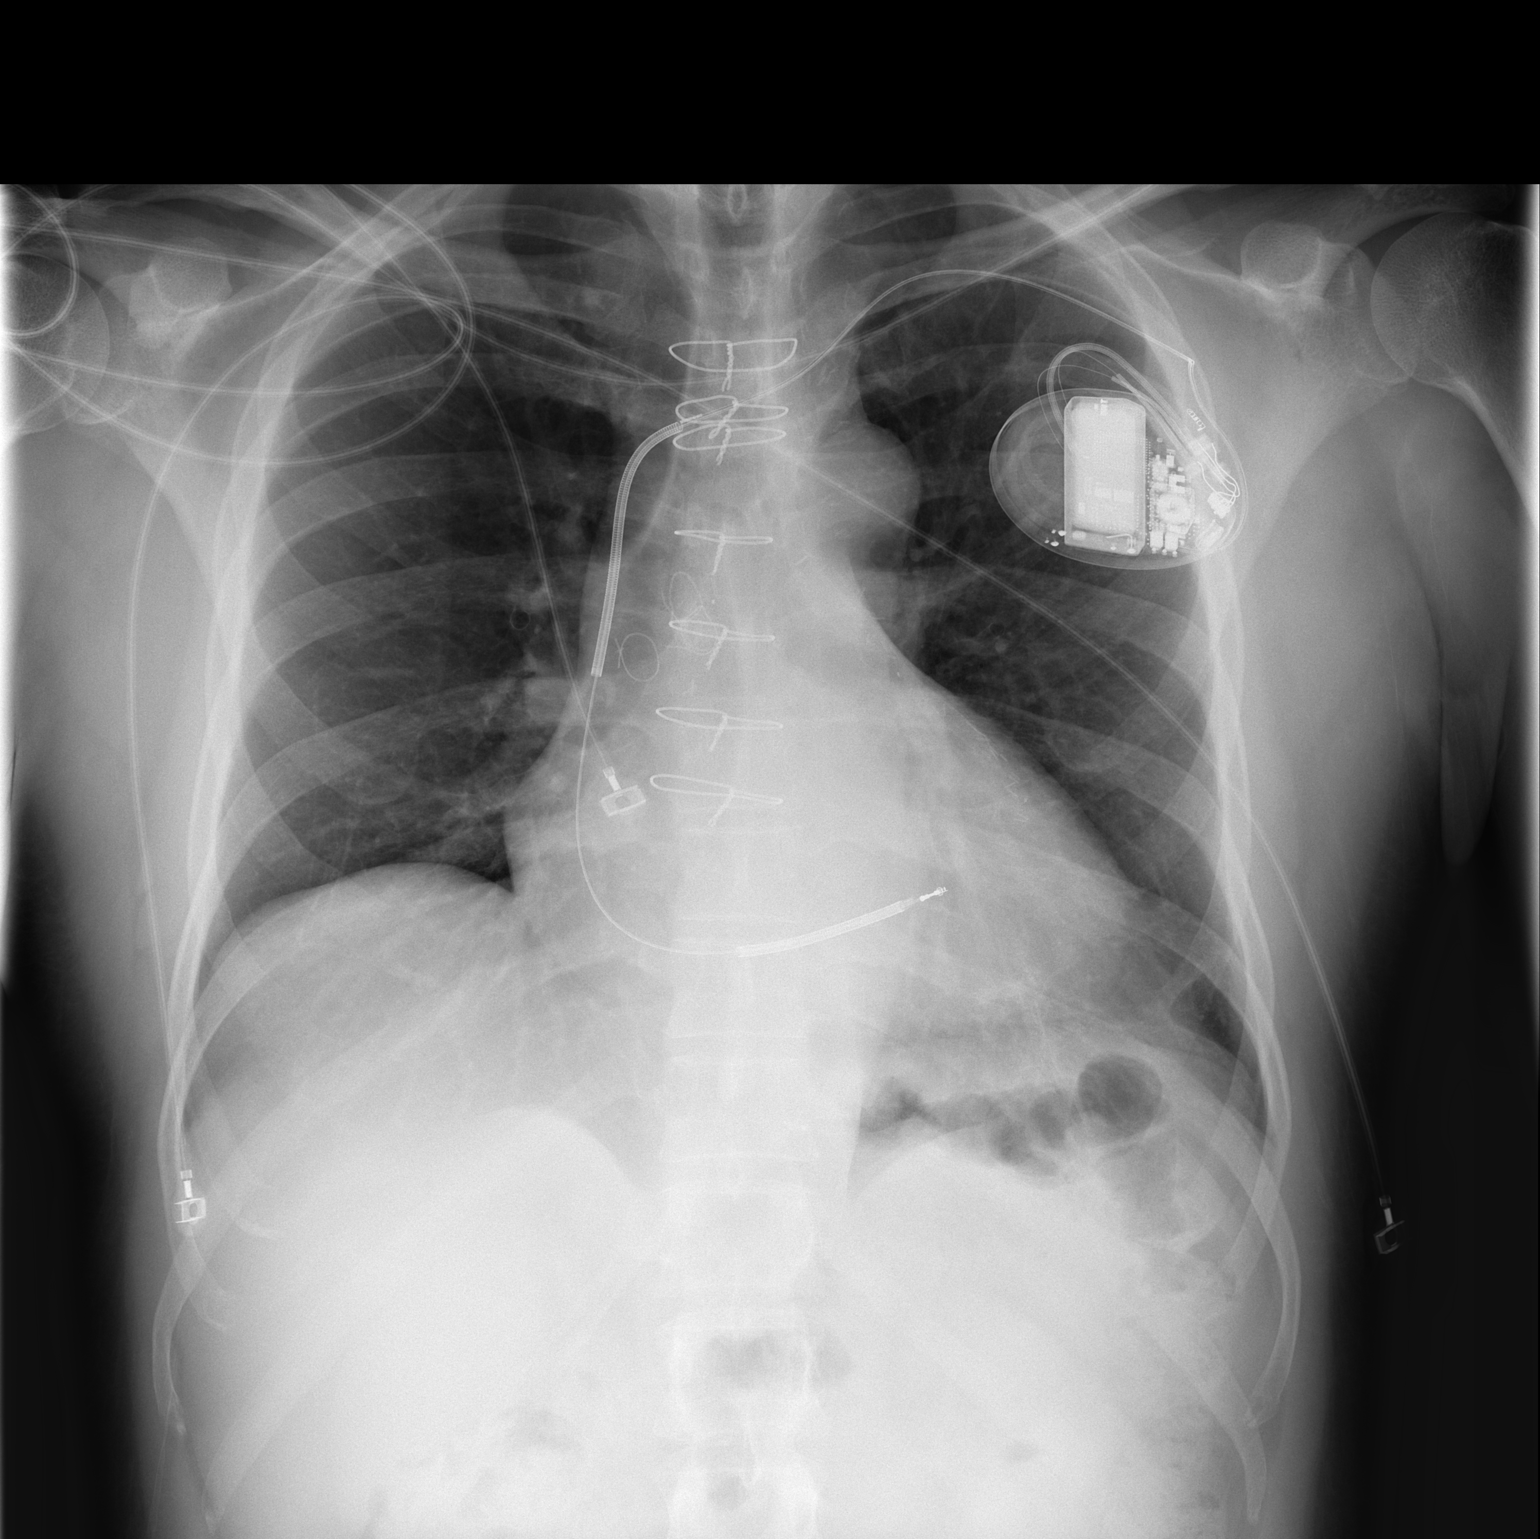

[w chest lat]
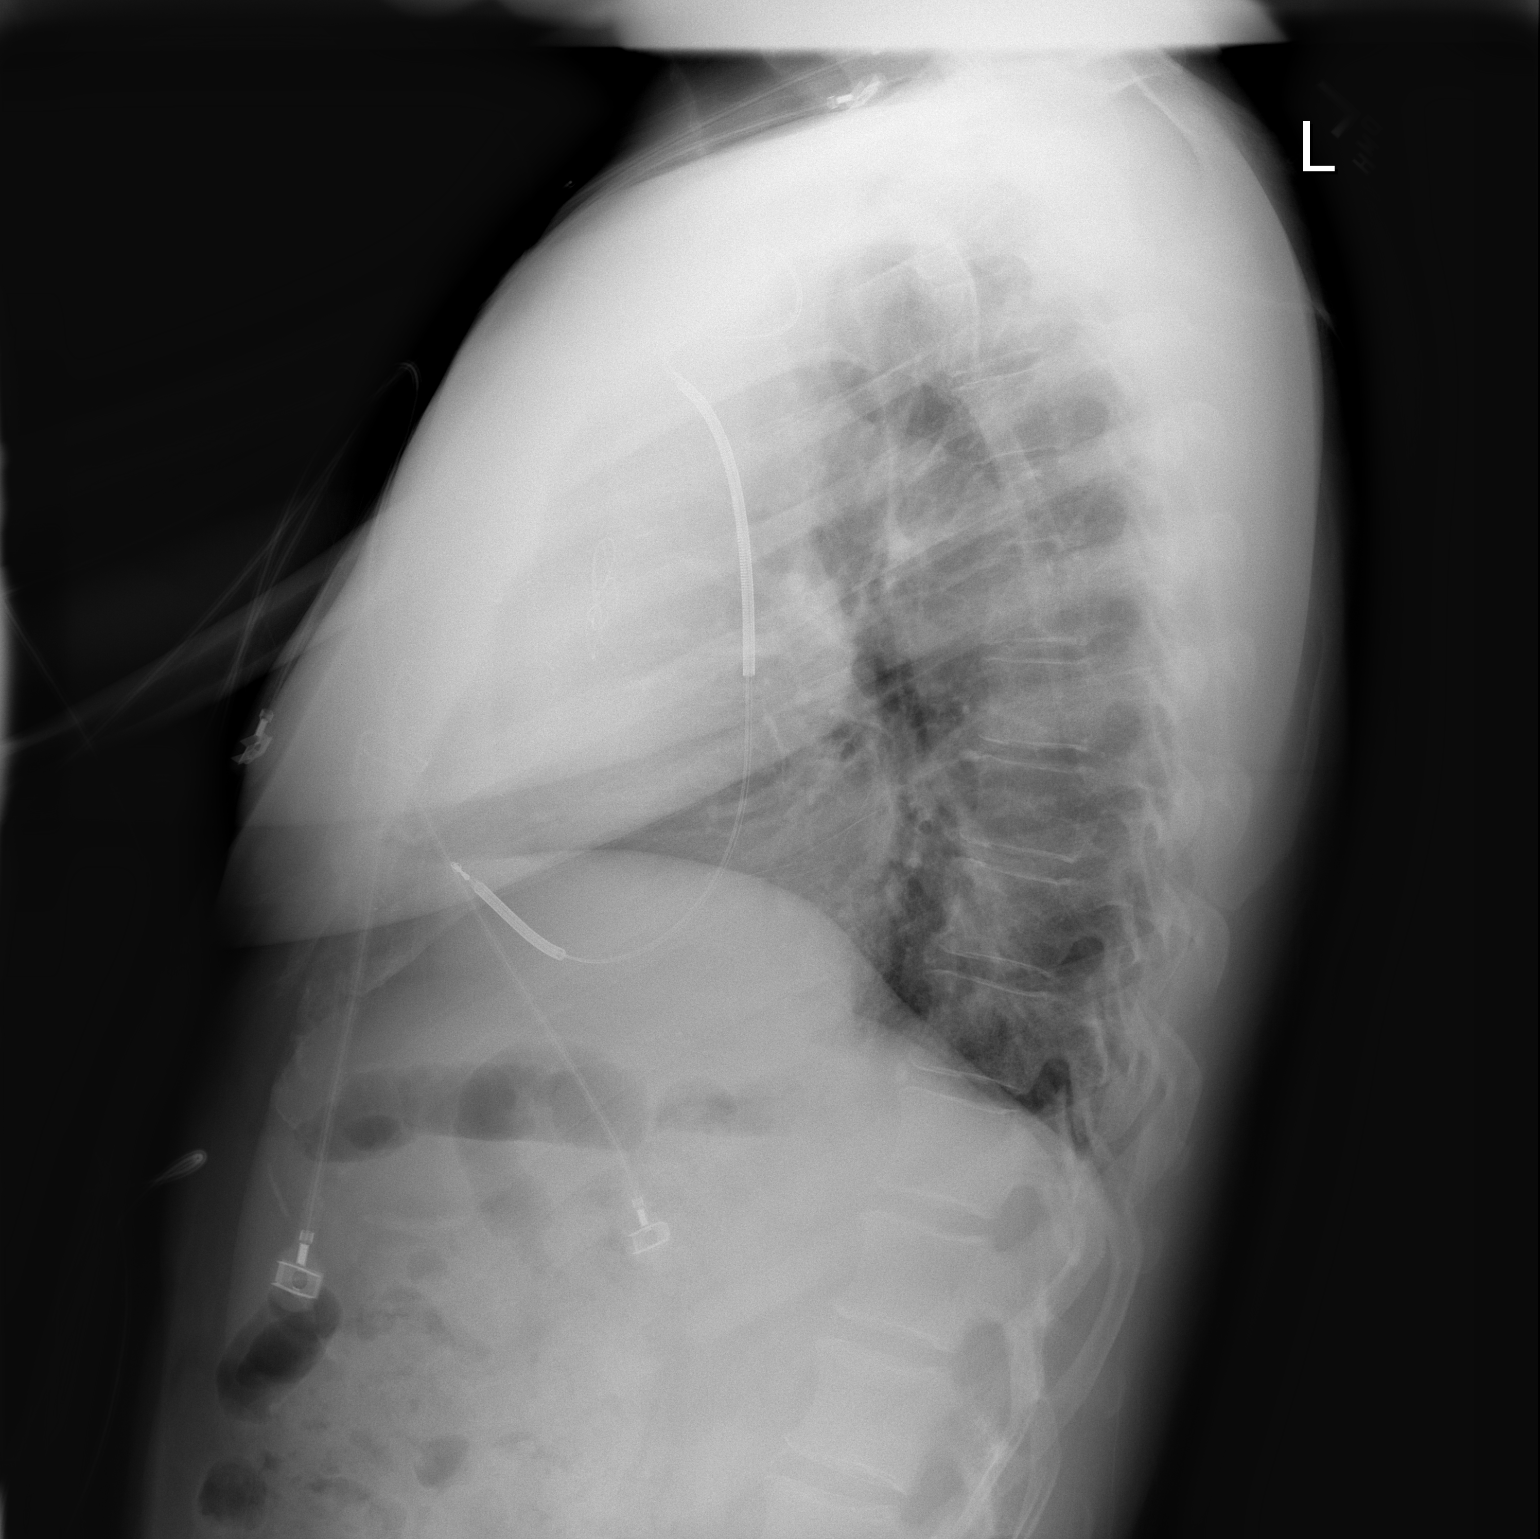

[2 of 2 positions shown; findings below may reference images not displayed]

FINDINGS: A left subclavian AICD has been placed.  The tip is in the right ventricle.  No pneumothoraces or effusions are seen.  The heart remains enlarged.  The pulmonary vasculature is within normal limits.
IMPRESSION: Left subclavian AICD placement without pneumothorax.

## 2006-05-08 ENCOUNTER — Ambulatory Visit: Payer: Self-pay | Admitting: Internal Medicine

## 2006-06-21 ENCOUNTER — Ambulatory Visit: Payer: Self-pay | Admitting: Internal Medicine

## 2006-07-06 ENCOUNTER — Emergency Department (HOSPITAL_COMMUNITY): Admission: EM | Admit: 2006-07-06 | Discharge: 2006-07-06 | Payer: Self-pay | Admitting: Emergency Medicine

## 2006-08-31 ENCOUNTER — Inpatient Hospital Stay (HOSPITAL_COMMUNITY): Admission: EM | Admit: 2006-08-31 | Discharge: 2006-09-03 | Payer: Self-pay | Admitting: *Deleted

## 2006-08-31 ENCOUNTER — Ambulatory Visit: Payer: Self-pay | Admitting: *Deleted

## 2006-09-04 ENCOUNTER — Ambulatory Visit: Payer: Self-pay | Admitting: Internal Medicine

## 2006-09-14 ENCOUNTER — Ambulatory Visit: Payer: Self-pay | Admitting: Internal Medicine

## 2006-10-02 ENCOUNTER — Ambulatory Visit: Payer: Self-pay | Admitting: Internal Medicine

## 2006-11-08 ENCOUNTER — Ambulatory Visit: Payer: Self-pay | Admitting: Internal Medicine

## 2006-11-08 ENCOUNTER — Emergency Department (HOSPITAL_COMMUNITY): Admission: EM | Admit: 2006-11-08 | Discharge: 2006-11-08 | Payer: Self-pay | Admitting: Emergency Medicine

## 2006-11-10 ENCOUNTER — Ambulatory Visit (HOSPITAL_COMMUNITY): Admission: RE | Admit: 2006-11-10 | Discharge: 2006-11-10 | Payer: Self-pay | Admitting: Internal Medicine

## 2006-11-13 ENCOUNTER — Ambulatory Visit (HOSPITAL_COMMUNITY): Admission: RE | Admit: 2006-11-13 | Discharge: 2006-11-13 | Payer: Self-pay | Admitting: Internal Medicine

## 2006-11-23 IMAGING — CT CT HEAD W/O CM
1 series · 16 of 30 positions shown, 20 images · IV contrast (agent unspecified)
Comparison: none

CLINICAL DATA: Dizziness.
 HEAD CT WITHOUT CONTRAST:
TECHNIQUE: Contiguous axial images were obtained from the base of the skull through the vertex according to standard protocol without contrast.

[Series 2: (id) head 4.8 h45s st · axial · 0.45mm/px · z∈[-140,+20]mm · 16 of 36 slices shown, 20 images]
[im 2/36  brain]
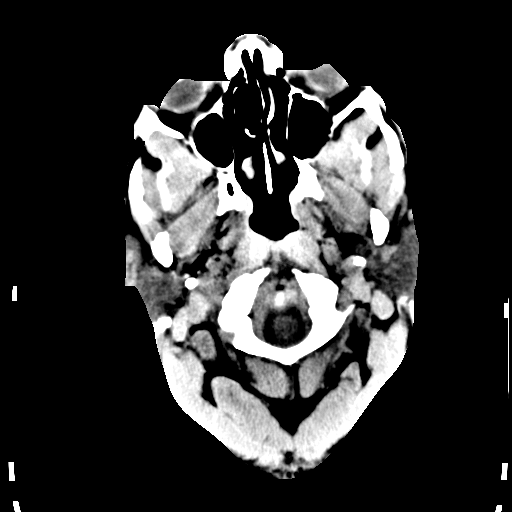
[im 2/36  bone]
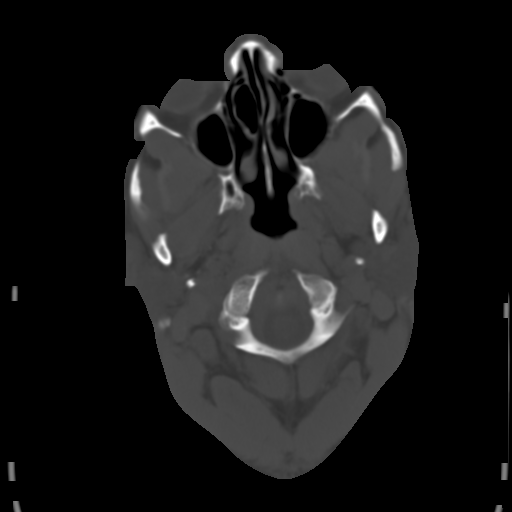
[im 4/36  brain]
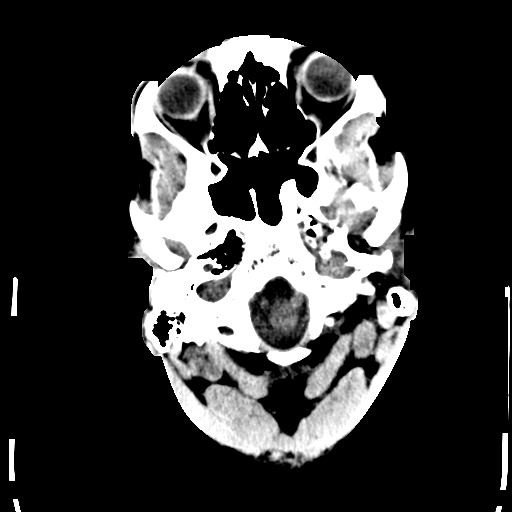
[im 7/36  brain]
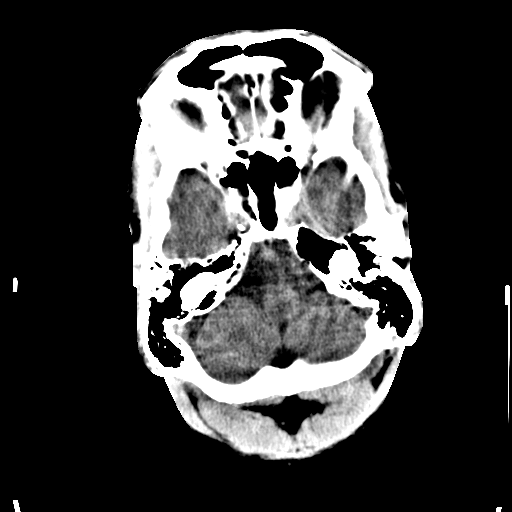
[im 9/36  brain]
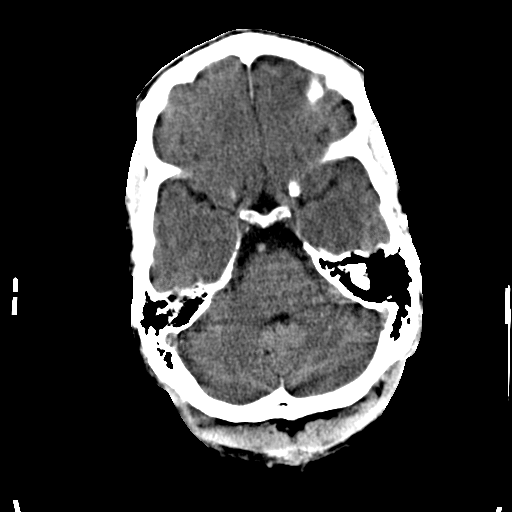
[im 10/36  brain]
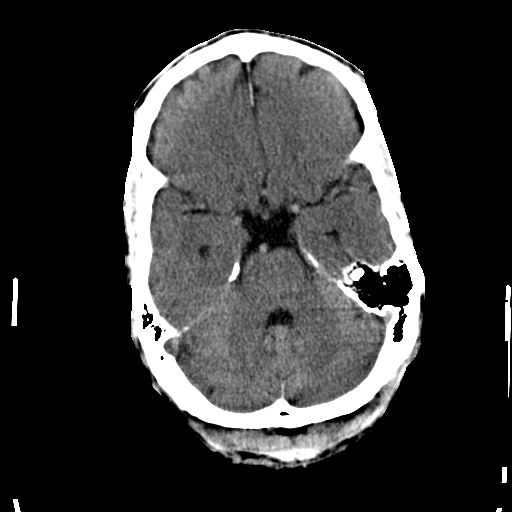
[im 10/36  bone]
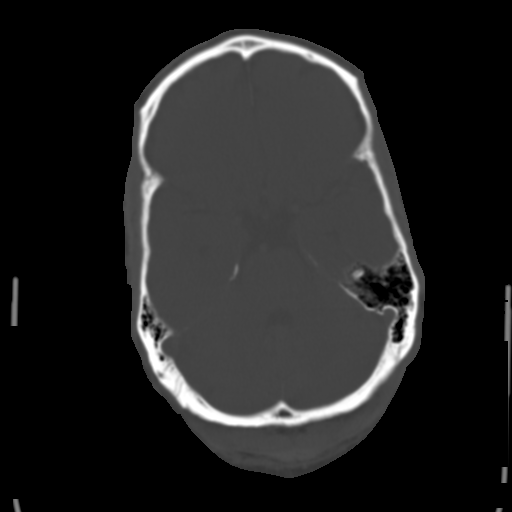
[im 13/36  brain]
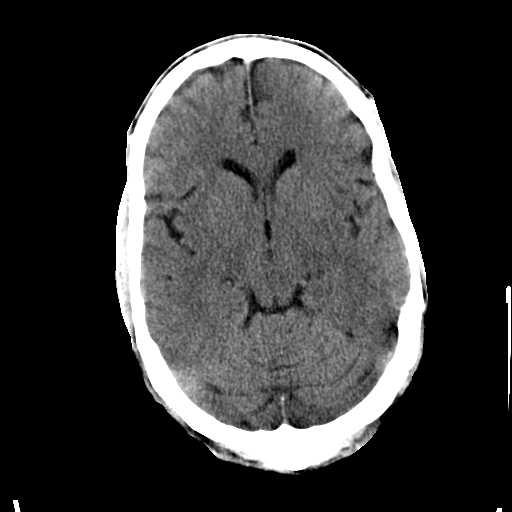
[im 15/36  brain]
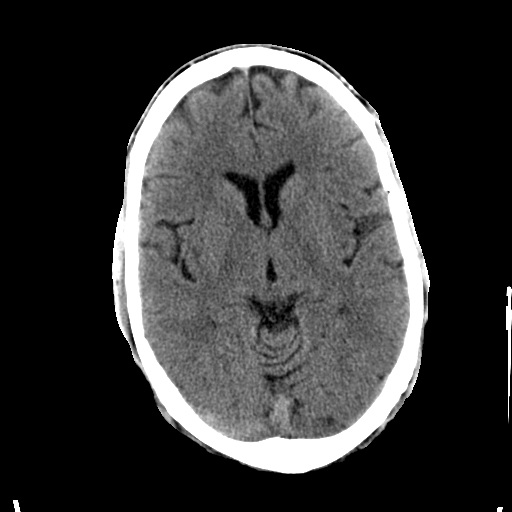
[im 17/36  brain]
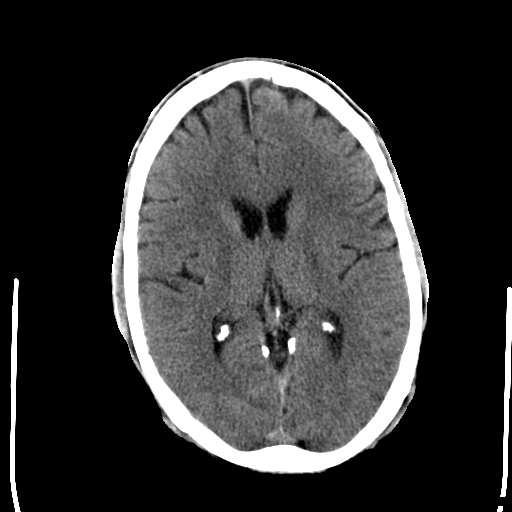
[im 19/36  brain]
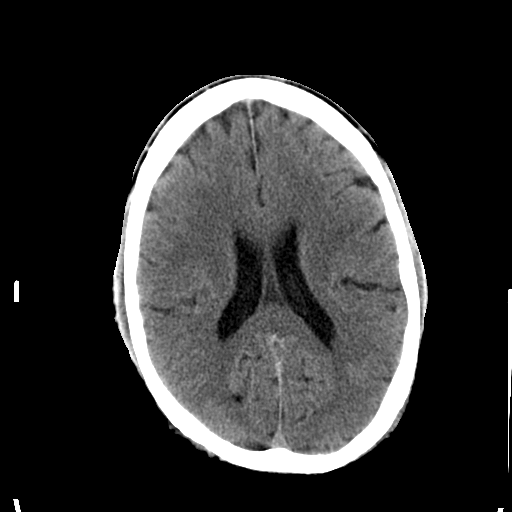
[im 19/36  bone]
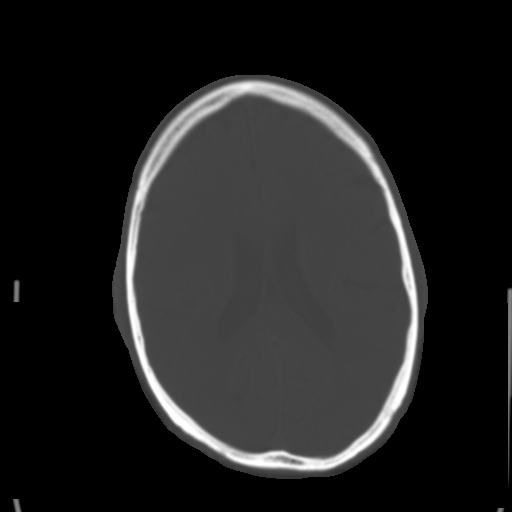
[im 21/36  brain]
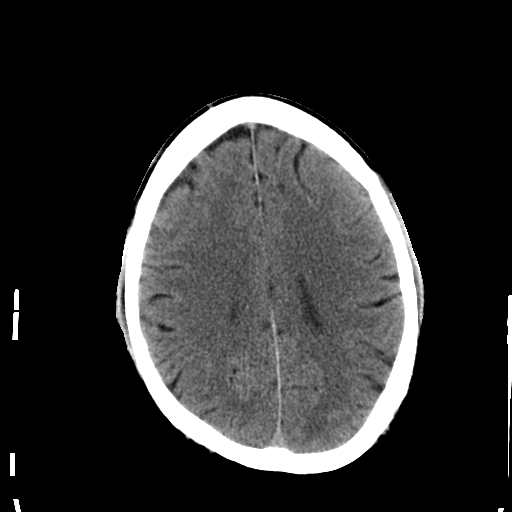
[im 23/36  brain]
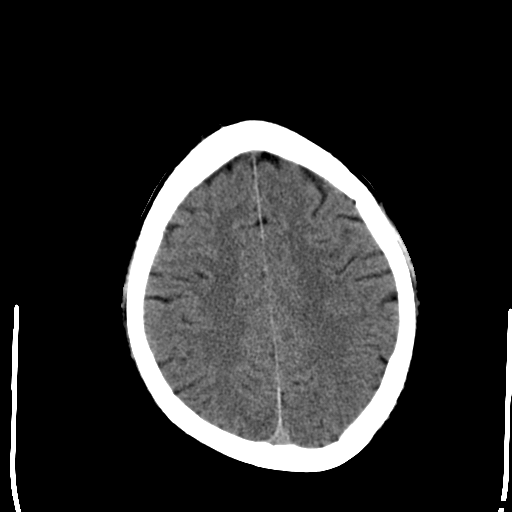
[im 26/36  brain]
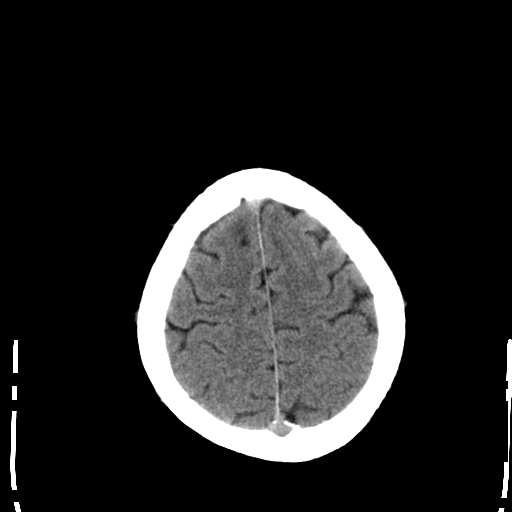
[im 27/36  brain]
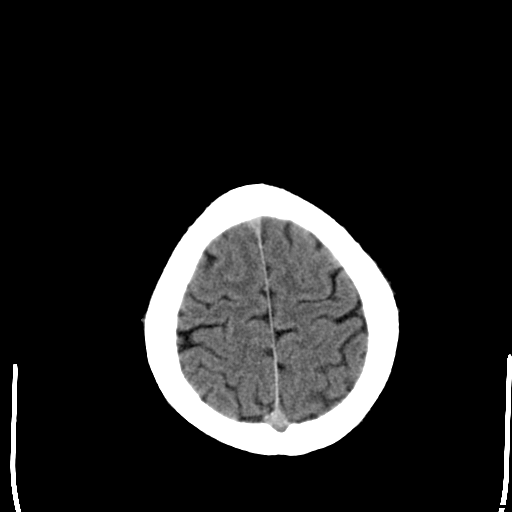
[im 27/36  bone]
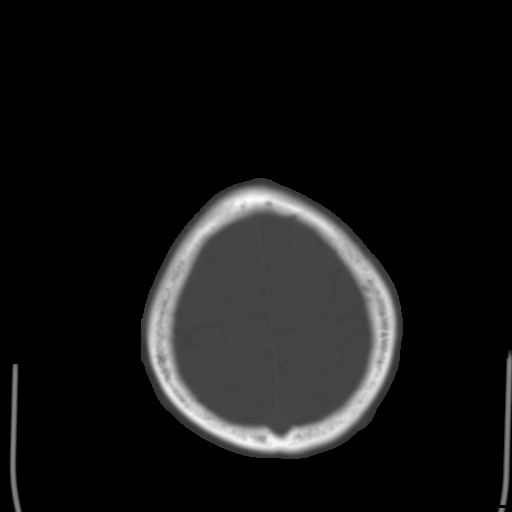
[im 29/36  brain]
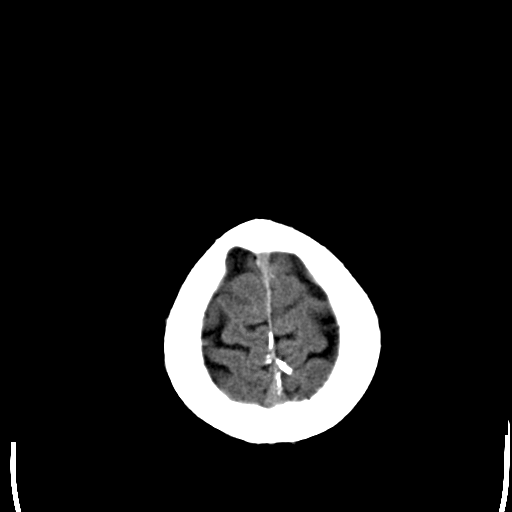
[im 32/36  brain]
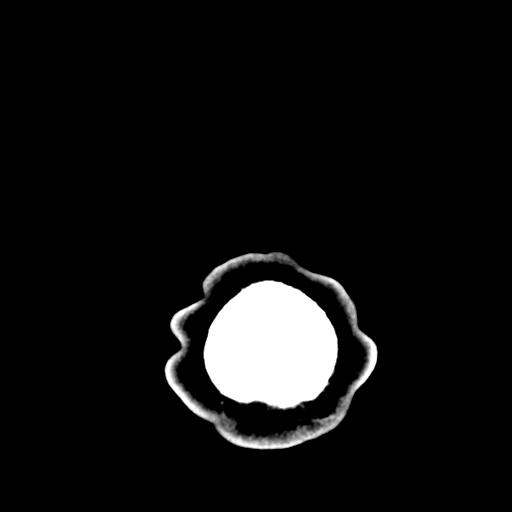
[im 34/36  brain]
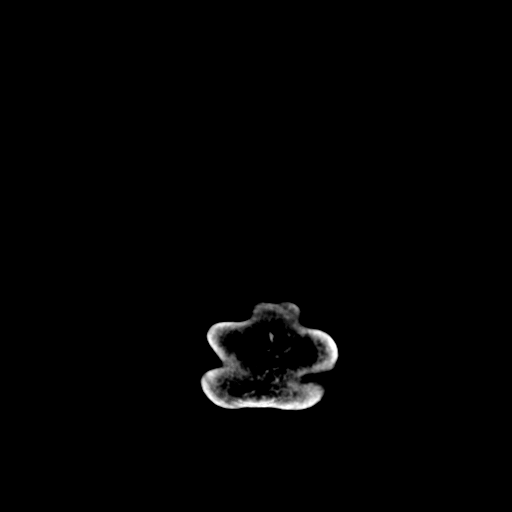

[16 of 30 positions shown; findings below may reference images not displayed]

FINDINGS: There is no evidence of intracranial hemorrhage, brain edema, acute infarct, mass lesion, or mass effect.  No other intra-axial abnormalities are seen, and the ventricles are within normal limits.  No abnormal extra-axial fluid collections or masses are identified.  No skull abnormalities are noted.
IMPRESSION: Negative non-contrast head CT.

## 2007-01-17 IMAGING — CR DG CHEST 1V PORT
1 series · 1 of 1 positions shown · non-contrast
Comparison: 11/25/05.

CLINICAL DATA: Chest pain.
 PORTABLE CHEST - 1 VIEW:

[view not recorded]
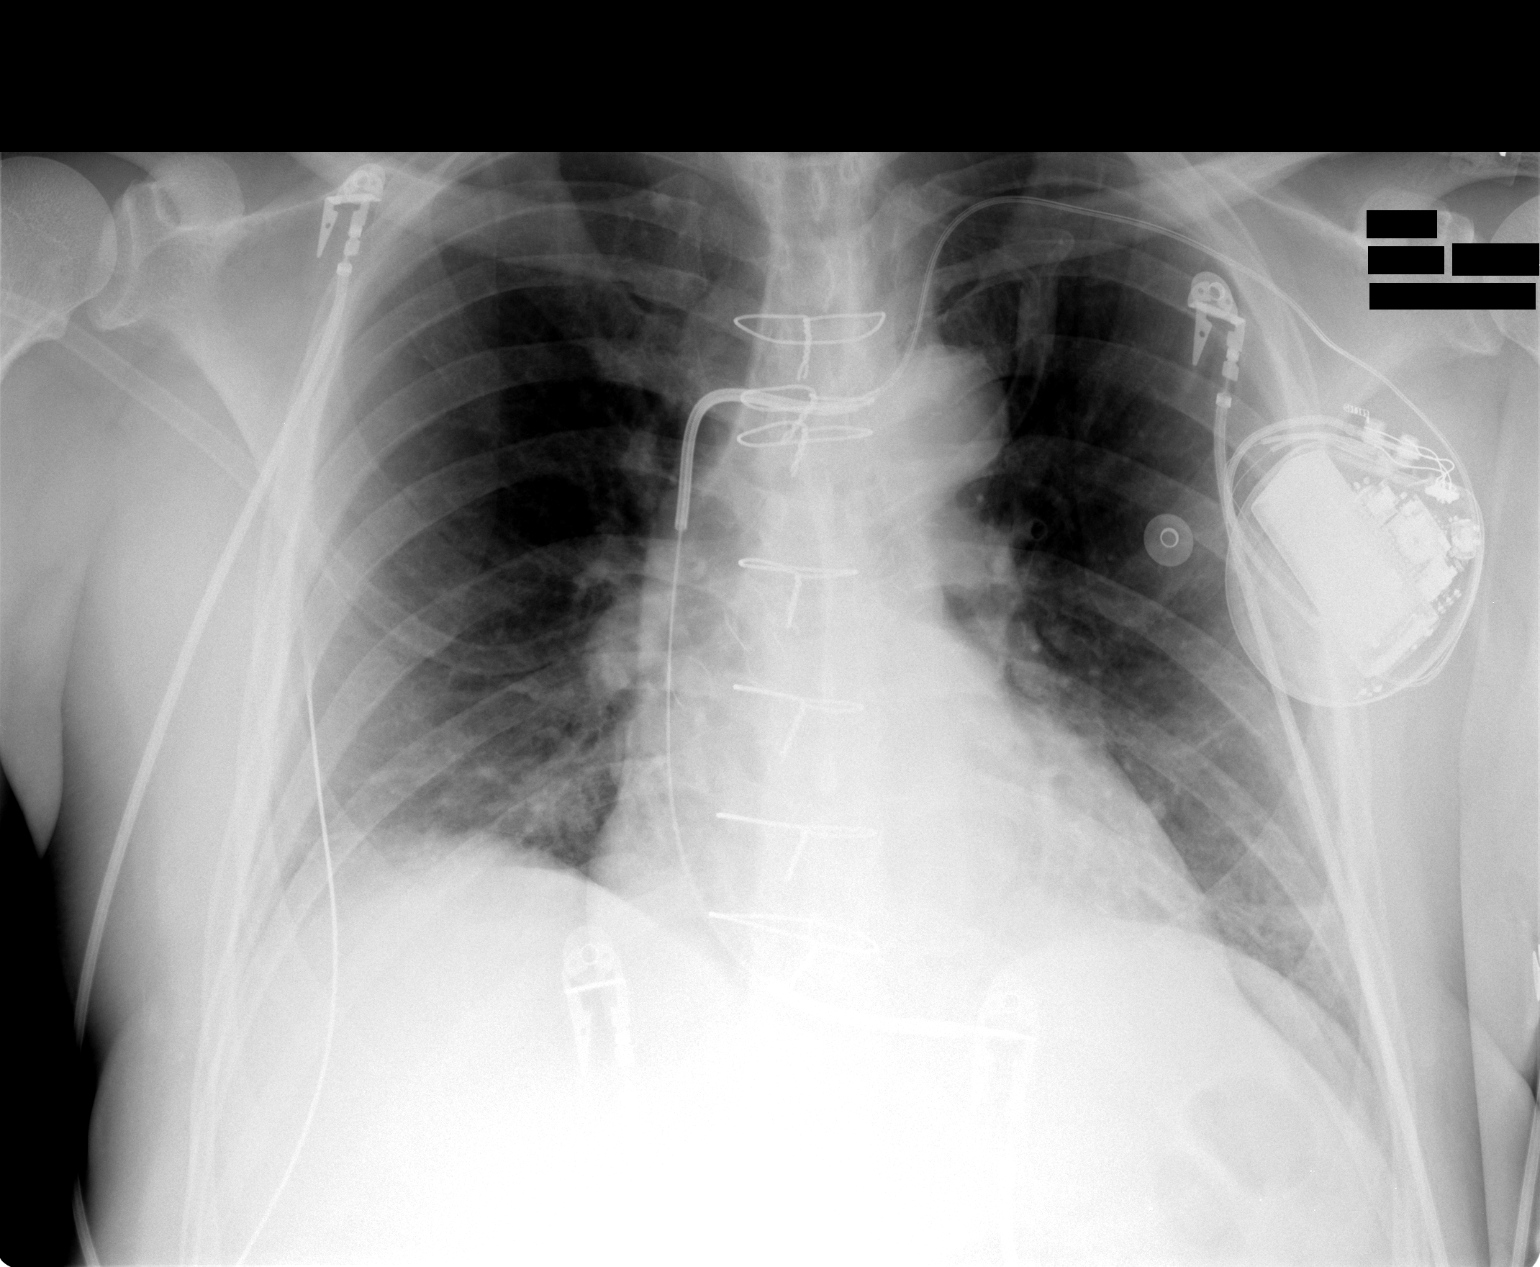

[1 of 1 positions shown; findings below may reference images not displayed]

FINDINGS: Cardiomegaly is stable.  There is no evidence of congestive heart failure.  Mild atelectasis is noted in both lung bases.  There is no evidence of pulmonary consolidation or pleural effusion.  AICD remains in place and the patient has undergone previous coronary artery bypass grafting.
IMPRESSION: Interval development of bibasilar atelectasis.  Stable cardiomegaly.  No evidence of congestive heart failure.

## 2007-01-18 IMAGING — CT CT ABDOMEN W/O CM
2 of 5 series · 14 of 32 positions shown, 19 images · IV contrast (agent unspecified)
Comparison: None.

CLINICAL DATA: 45-year-old male.  Acute severe epigastric and right abdominal pain.  Elevated LFTs.  History of prior coronary bypass.  
ABDOMEN CT WITHOUT CONTRAST:
TECHNIQUE: Multidetector CT imaging of the abdomen was performed following the standard protocol without IV contrast.

[Series 4: recon 3: routine abdomen · axial · 0.82mm/px · z∈[-192,+44]mm · 8 of 245 slices shown, 13 images]
[im 28/245  soft-tissue]
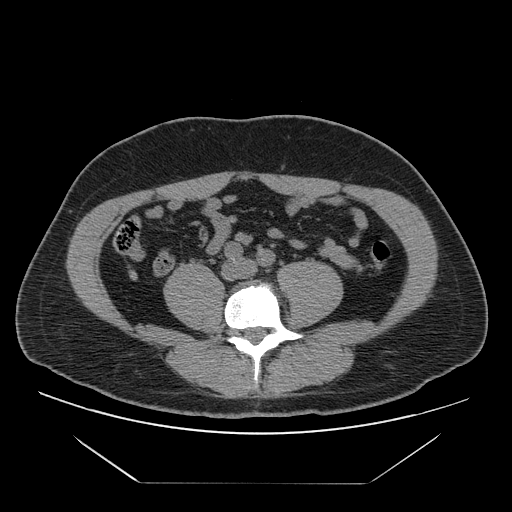
[im 28/245  bone]
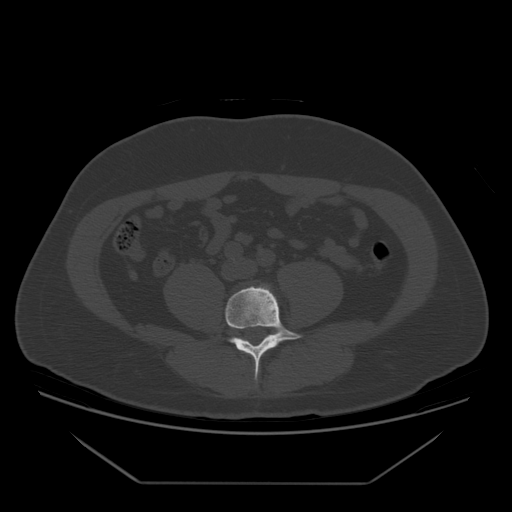
[im 55/245  soft-tissue]
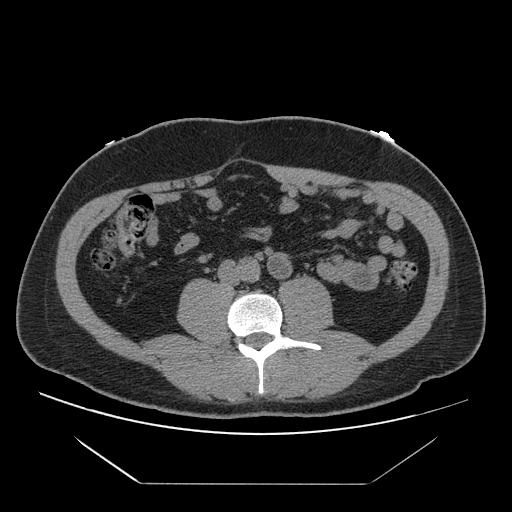
[im 82/245  soft-tissue]
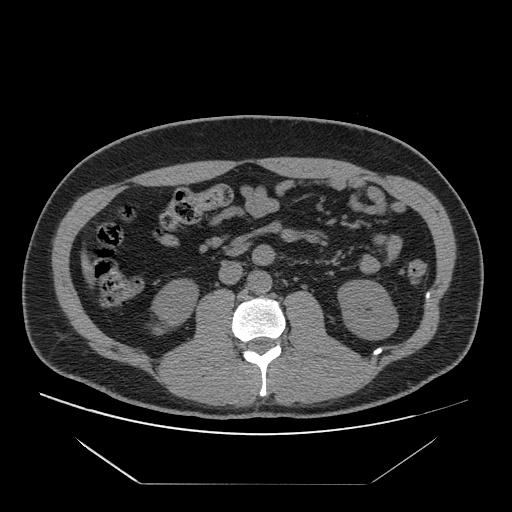
[im 109/245  soft-tissue]
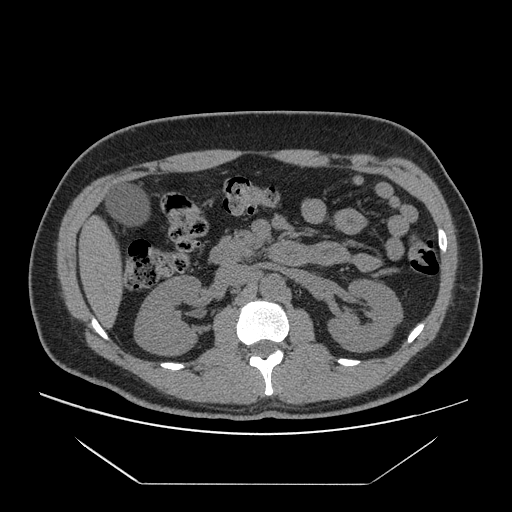
[im 136/245  soft-tissue]
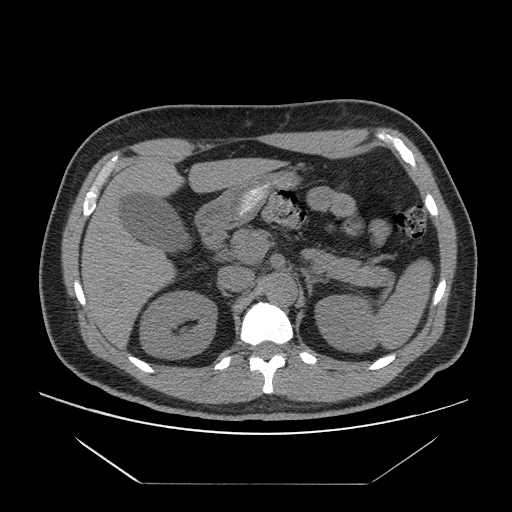
[im 136/245  lung]
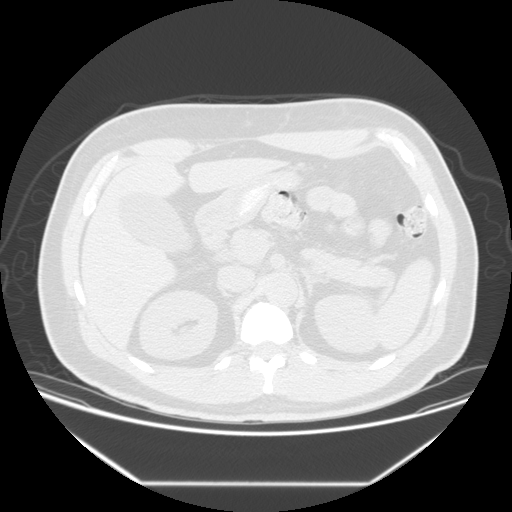
[im 163/245  soft-tissue]
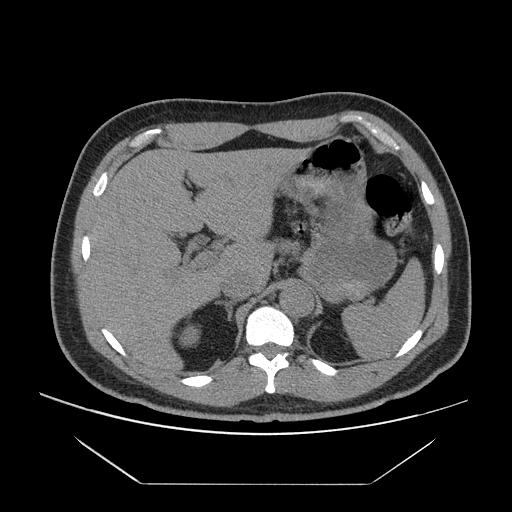
[im 163/245  lung]
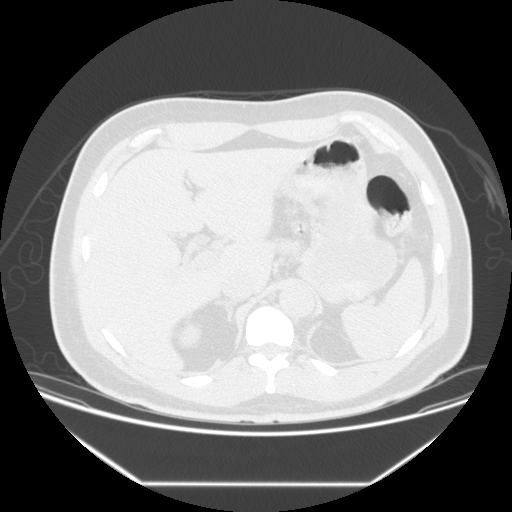
[im 190/245  soft-tissue]
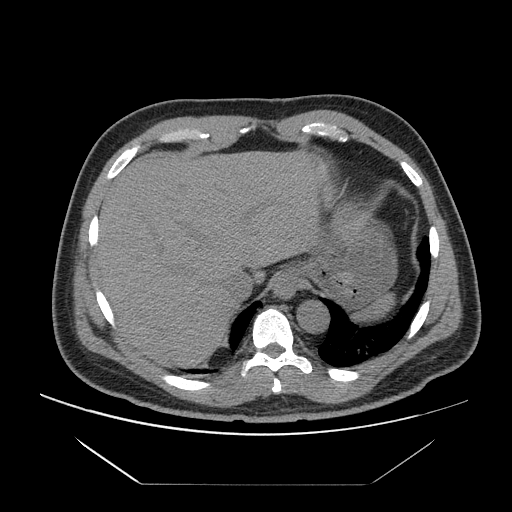
[im 190/245  lung]
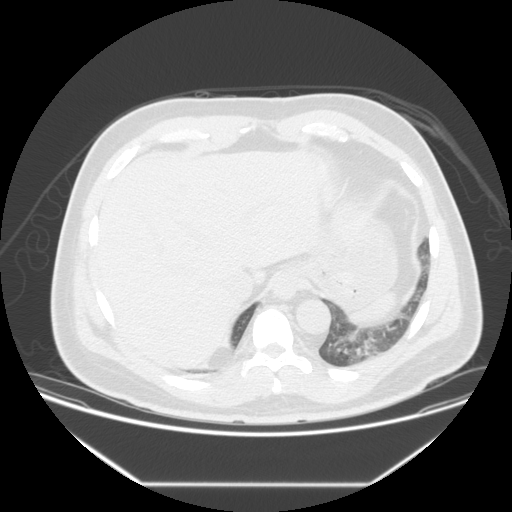
[im 217/245  soft-tissue]
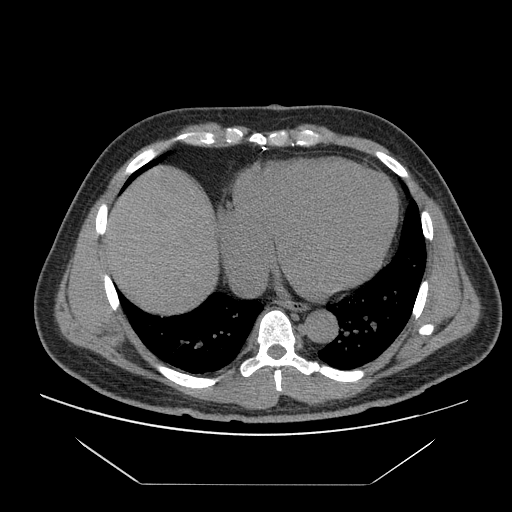
[im 217/245  lung]
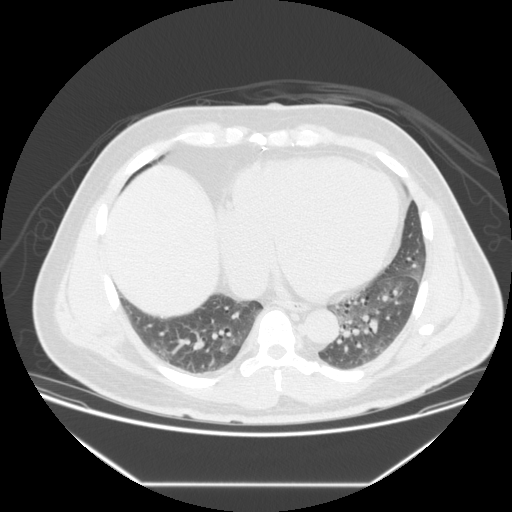

[Series 400: reformatted · coronal · 0.82mm/px · 6 of 256 slices shown]
[im 26/256  soft-tissue]
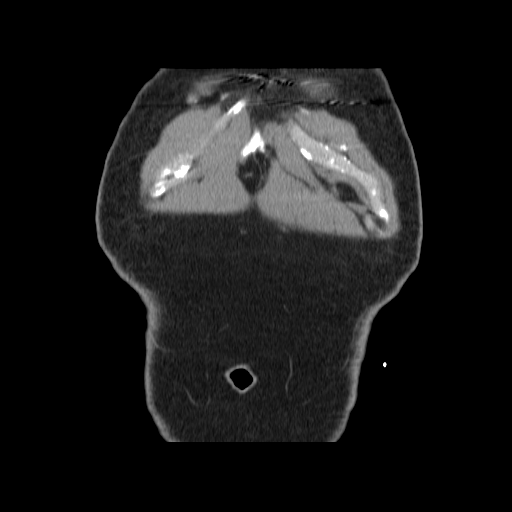
[im 52/256  soft-tissue]
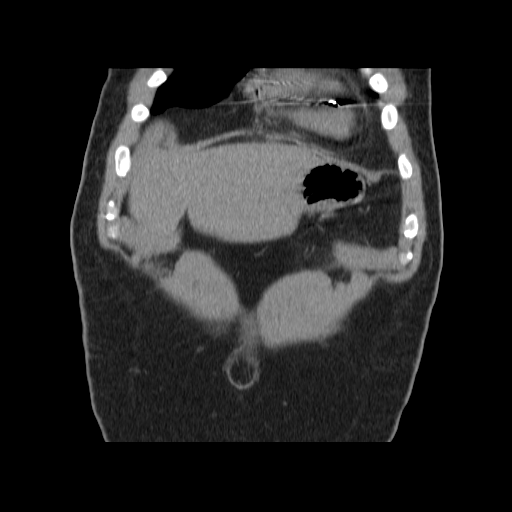
[im 77/256  soft-tissue]
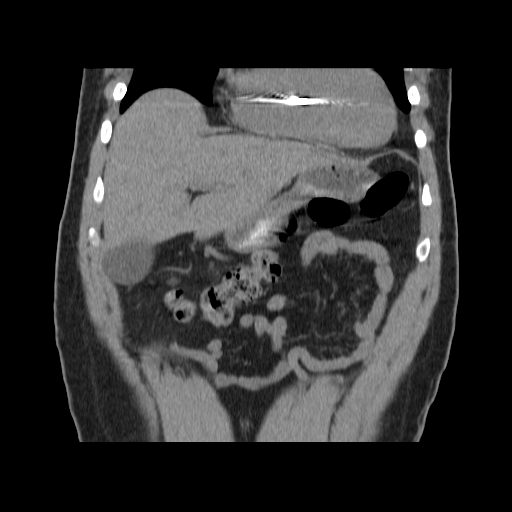
[im 103/256  soft-tissue]
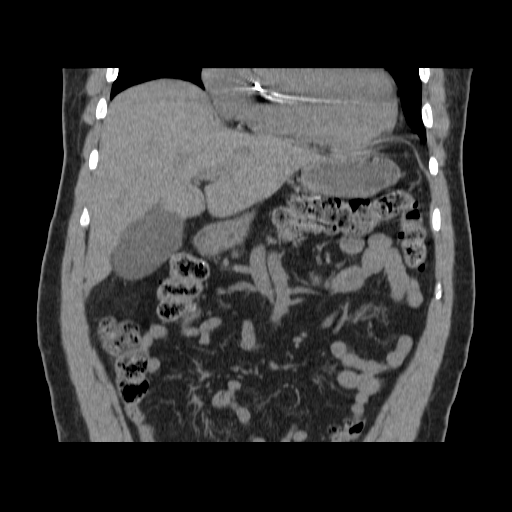
[im 154/256  soft-tissue]
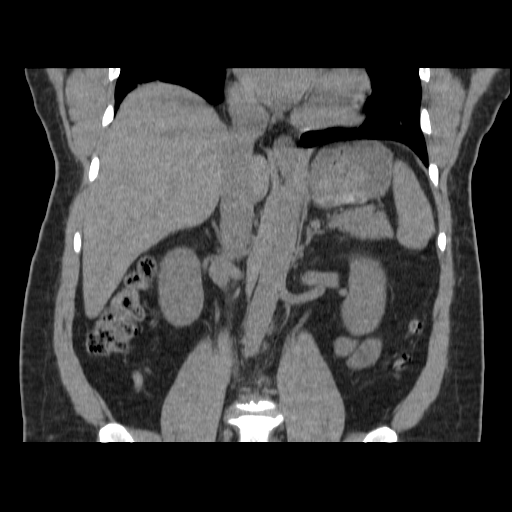
[im 179/256  soft-tissue]
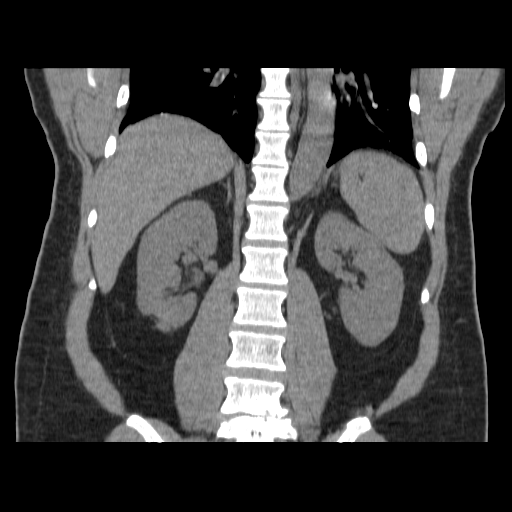

[14 of 32 positions shown; findings below may reference images not displayed]

FINDINGS: Defibrillator wire is noted in the right side of the heart extending to the right ventricular apex.  Mild cardiac enlargement.  No pericardial or pleural fluid.  Bibasilar dependent atelectasis.  
In the abdomen, no acute inflammation, fluid collection, ascites, adenopathy, abscess, or free air.  No bowel obstruction.  
Within the limits of the noncontrast CT, the liver, gallbladder, biliary system, adrenal glands, kidneys, spleen, and pancreas are within normal limits.  Diverticulosis is present in the colon without acute inflammation.  In the right lower quadrant, the terminal ileum and appendix demonstrate no acute finding.  Small appendicolith is noted, 1-2 mm in size on image 56.  Minimal atherosclerotic calcifications of the aortic bifurcation and proximal iliac vessels.  Postoperative changes from a right L5-S1 hemilaminectomy.  Degenerative changes are also noted diffusely.
IMPRESSION: 1.  No acute finding in the abdomen by noncontrast CT.  
2.  Bibasilar atelectasis.
3.  Moderate gallbladder distention but no acute inflammation or definite stone disease by CT.  
4.  Tiny incidental appendicolith, but no acute inflammation.

## 2007-01-19 IMAGING — US US ABDOMEN COMPLETE
1 series · 14 of 25 positions shown · non-contrast
Comparison: none

CLINICAL DATA: Chest pain.  Rule out acute cholecystitis. 
 ABDOMEN ULTRASOUND:
TECHNIQUE: Complete abdominal ultrasound examination was performed including evaluation of the liver, gallbladder, bile ducts, pancreas, kidneys, spleen, IVC, and abdominal aorta.

[Series 1: unknown · 0.40mm/px · 14 of 87 slices shown]
[im 1/87]
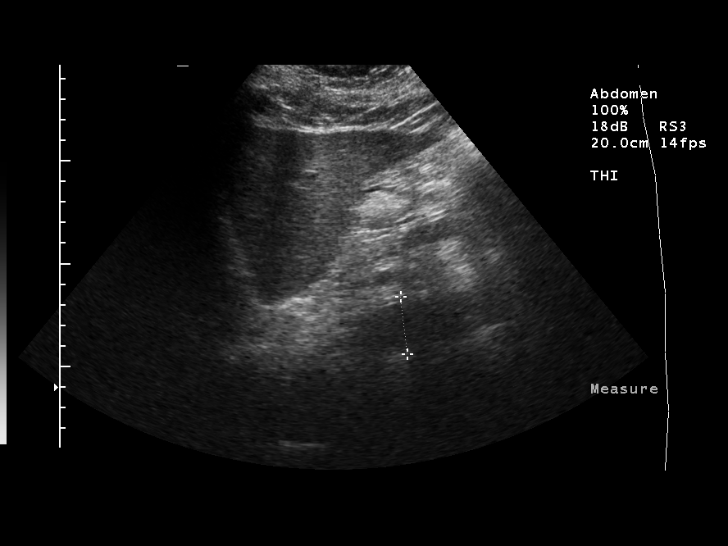
[im 8/87]
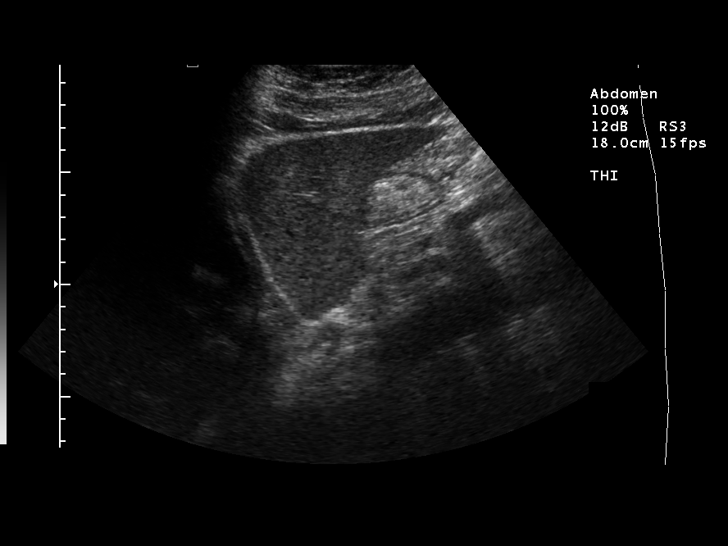
[im 15/87]
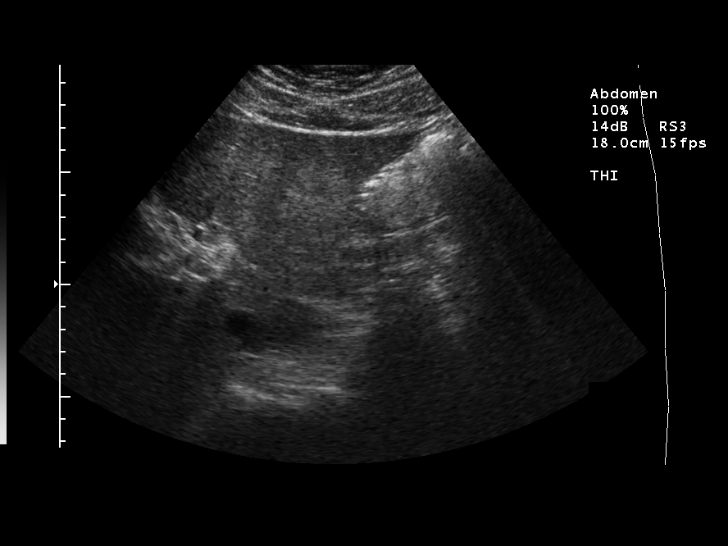
[im 22/87]
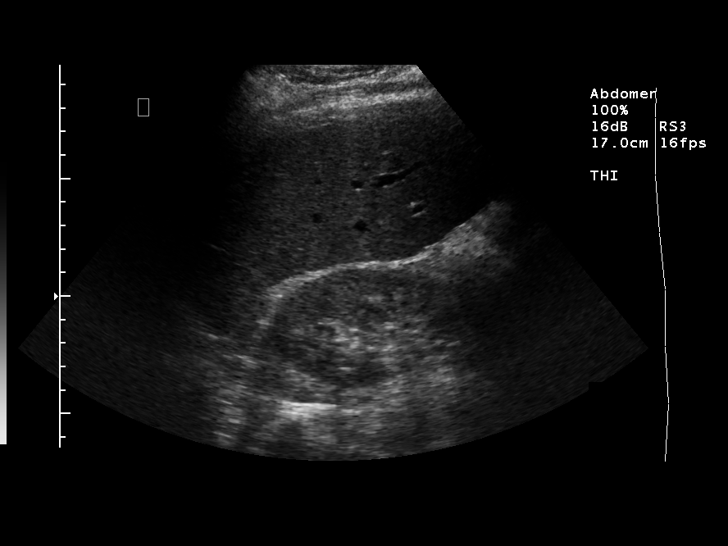
[im 29/87]
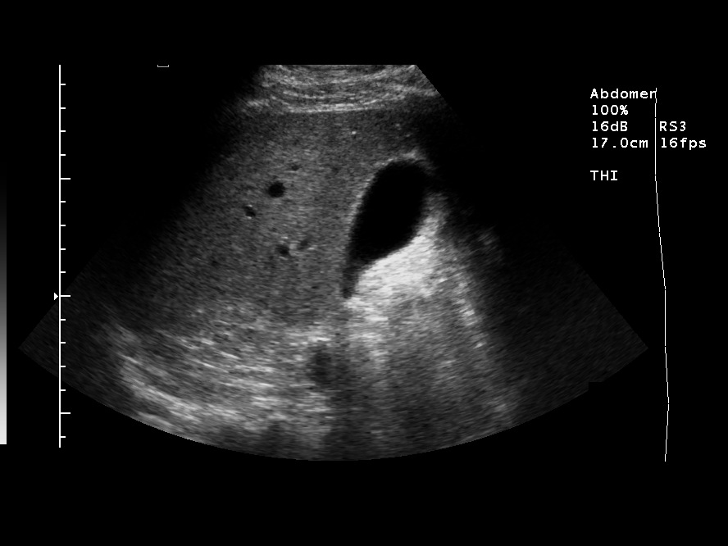
[im 33/87]
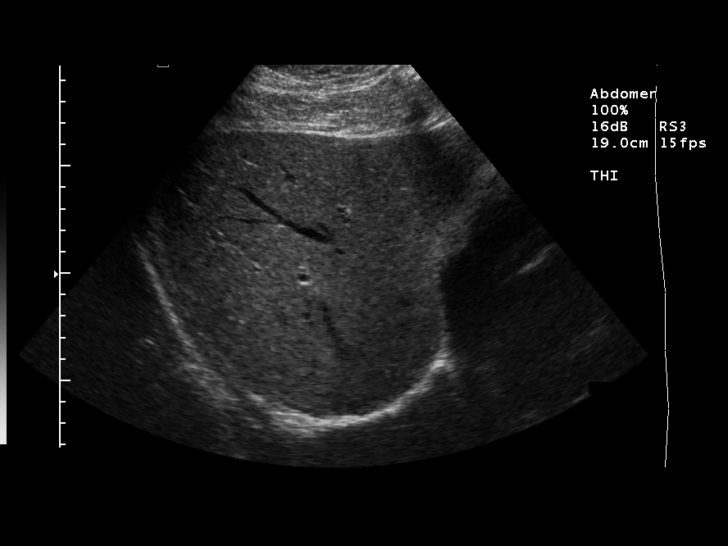
[im 40/87]
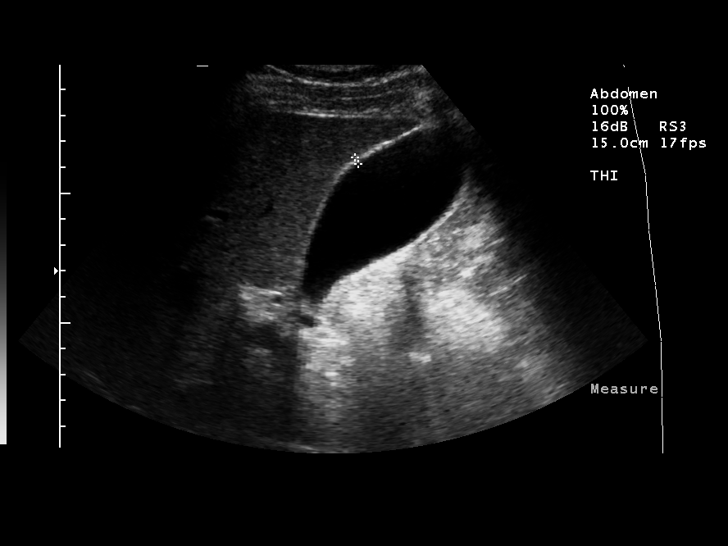
[im 47/87]
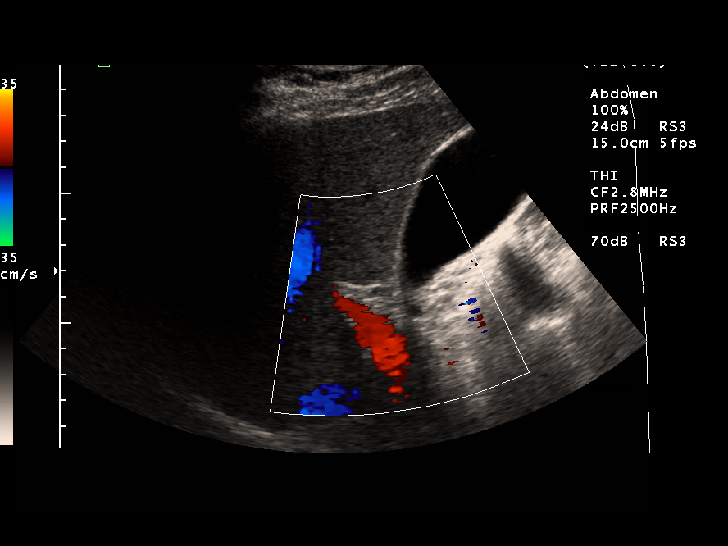
[im 54/87]
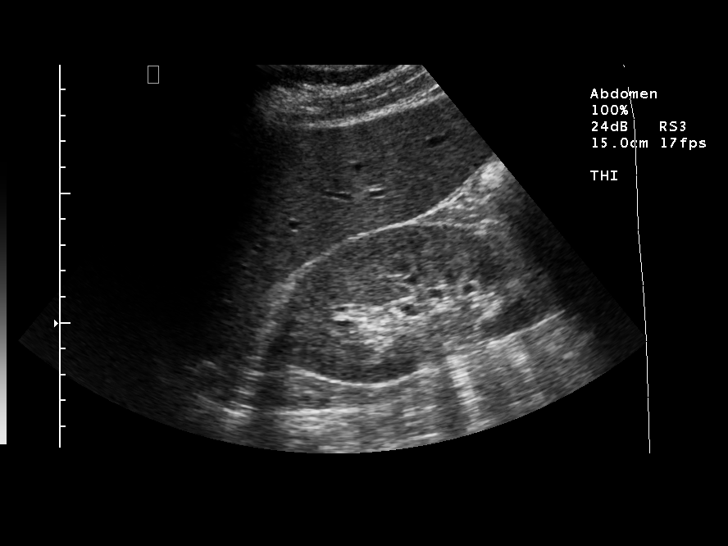
[im 58/87]
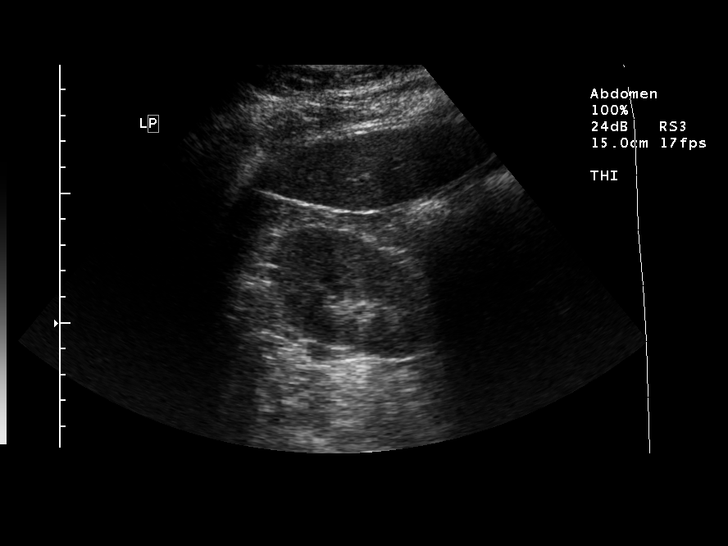
[im 65/87]
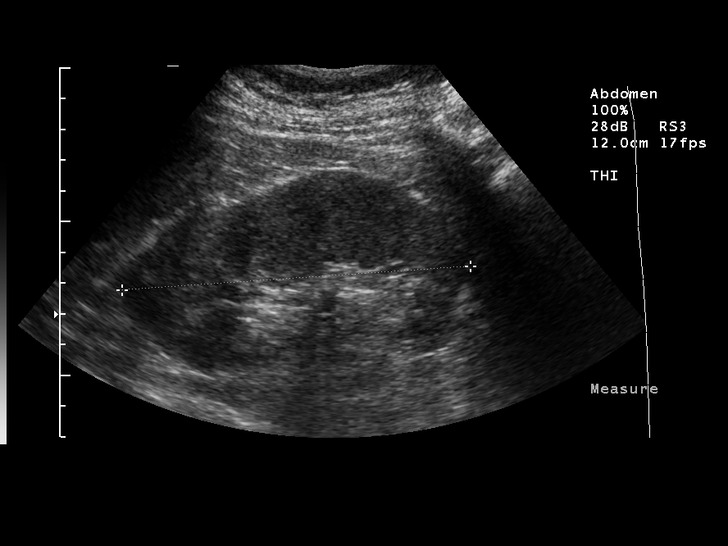
[im 72/87]
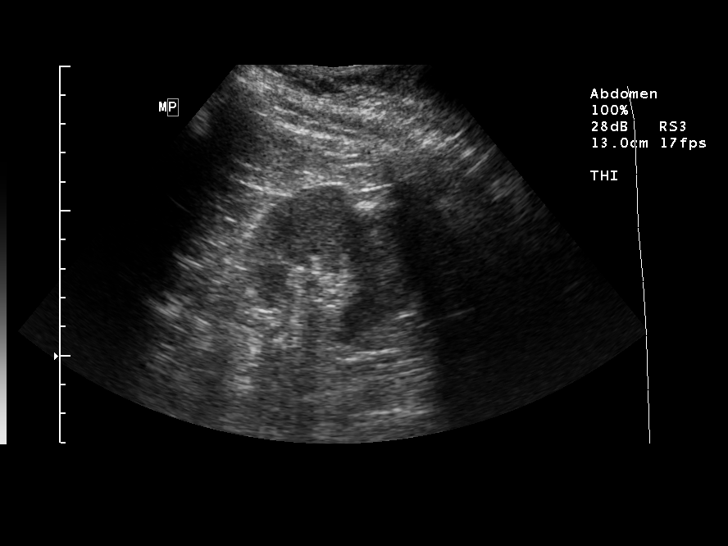
[im 79/87]
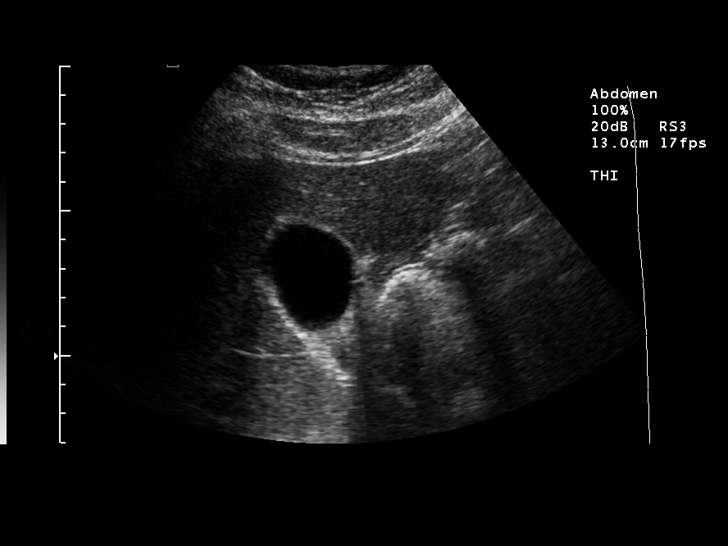
[im 87/87]
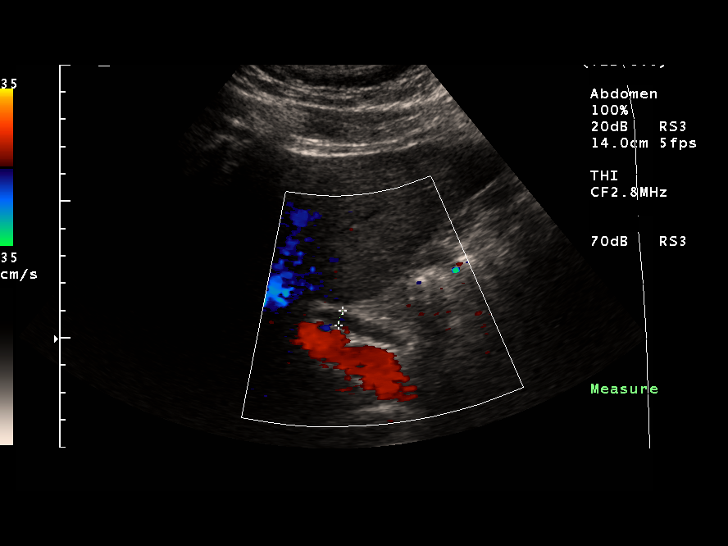

[14 of 25 positions shown; findings below may reference images not displayed]

FINDINGS: The gallbladder is normal in appearance.  There is no wall thickening.  No stones are identified.  There is a small amount of sludge seen along the gallbladder wall.  No pericholecystic fluid.  Common bile duct normal in caliber.  Liver negative.  Inferior vena cava negative.  Visualization of pancreas is limited due to overlying bowel gas. 
 Spleen is normal.  Left kidney normal.  Right kidney normal.  Abdominal aorta measures 2.8 cm in the AP diameter.
IMPRESSION: Gallbladder sludge.

## 2007-02-22 ENCOUNTER — Ambulatory Visit: Payer: Self-pay | Admitting: Internal Medicine

## 2007-03-05 ENCOUNTER — Ambulatory Visit: Payer: Self-pay

## 2007-03-05 LAB — CONVERTED CEMR LAB
CO2: 33 meq/L — ABNORMAL HIGH (ref 19–32)
Cholesterol: 257 mg/dL (ref 0–200)
Creatinine, Ser: 1.1 mg/dL (ref 0.4–1.5)
Direct LDL: 147.3 mg/dL
HDL: 38.5 mg/dL — ABNORMAL LOW (ref >39.0)
Hgb A1c MFr Bld: 6.1 % — ABNORMAL HIGH (ref 4.6–6.0)
Potassium: 3.7 meq/L (ref 3.5–5.1)
Sodium: 143 meq/L (ref 135–145)
Total Bilirubin: 1 mg/dL (ref 0.3–1.2)
Total Protein: 7.2 g/dL (ref 6.0–8.3)
VLDL: 47 mg/dL — ABNORMAL HIGH (ref 0–40)

## 2007-03-28 IMAGING — CR DG CHEST 1V PORT
1 series · 1 of 1 positions shown · non-contrast
Comparison: 08/30/06.

CLINICAL DATA: Chest pain.  History of bypass surgery.
 PORTABLE CHEST ? 1 VIEW ? 11/08/06 ? 0811 HOURS:

[view not recorded]
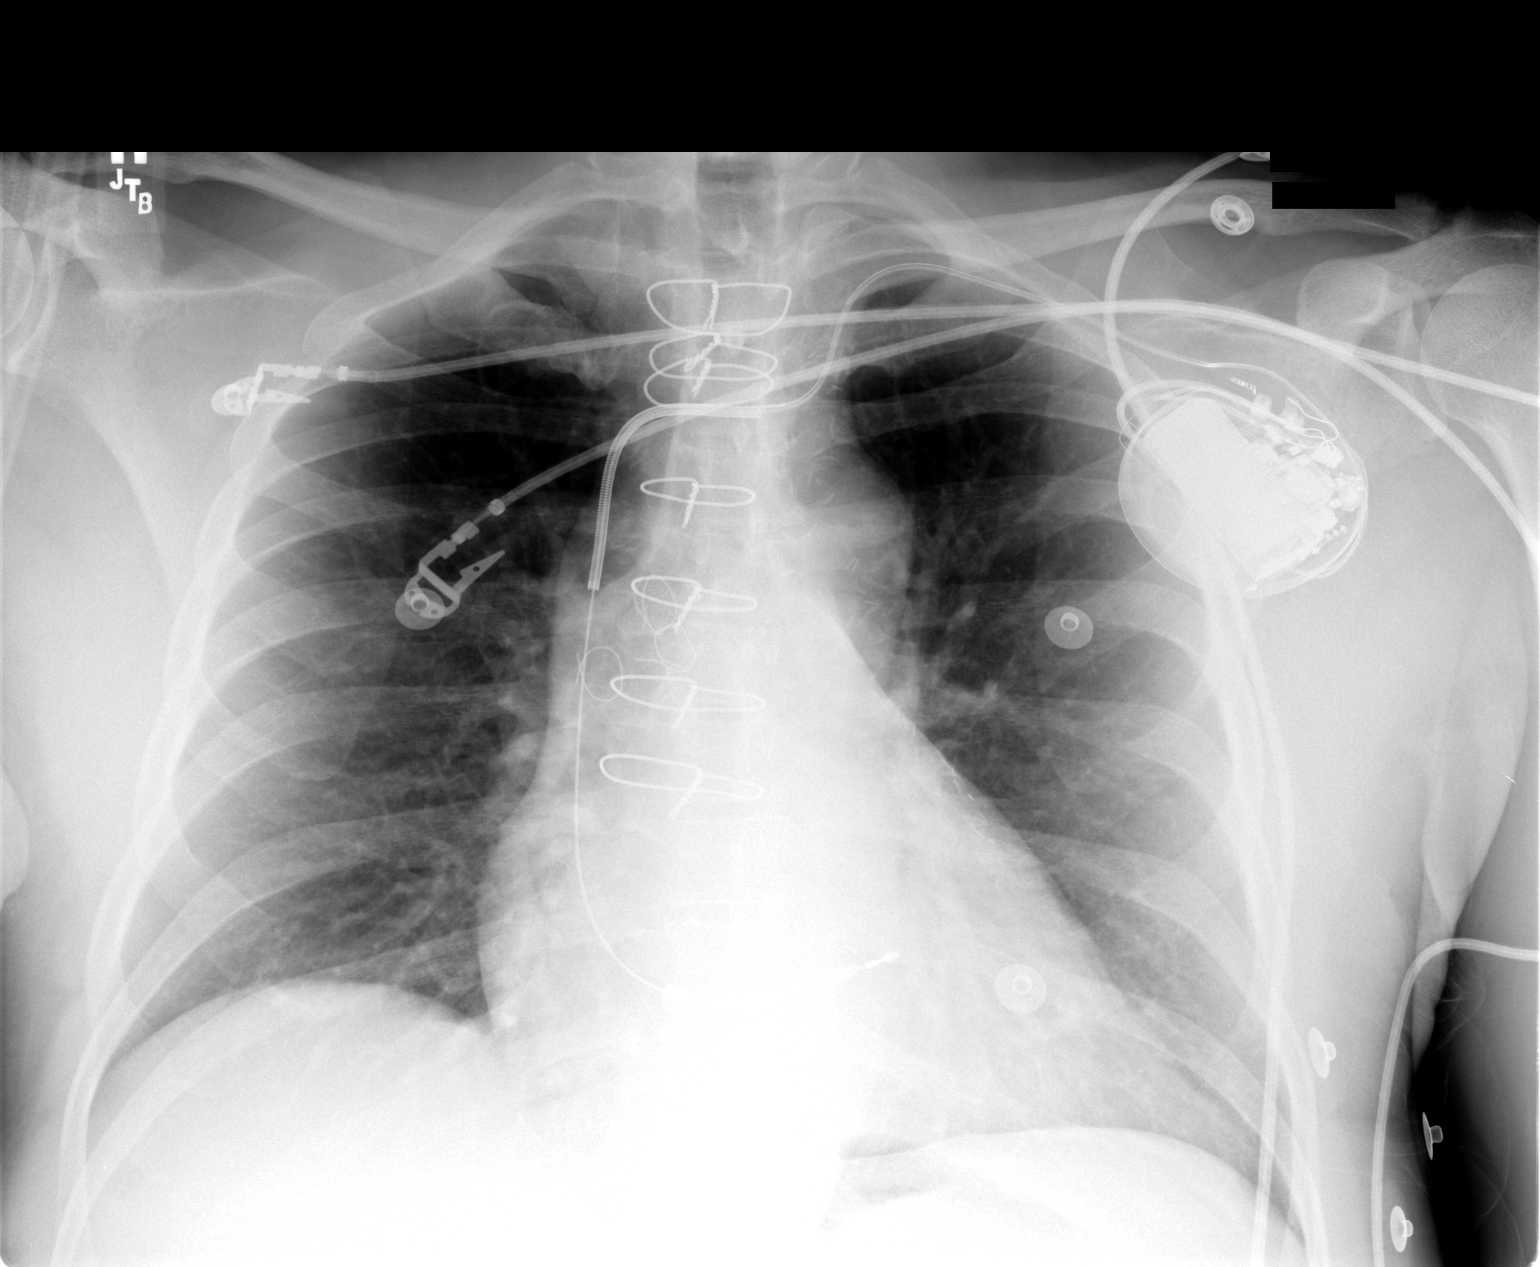

[1 of 1 positions shown; findings below may reference images not displayed]

FINDINGS: Portable view of the chest shows the lungs to be clear.  Mild cardiomegaly is stable.  Patient with AICD lead remains.
IMPRESSION: No active lung disease.  Stable cardiomegaly with pacer.

## 2007-05-14 ENCOUNTER — Inpatient Hospital Stay (HOSPITAL_COMMUNITY): Admission: EM | Admit: 2007-05-14 | Discharge: 2007-05-15 | Payer: Self-pay | Admitting: Emergency Medicine

## 2007-05-14 ENCOUNTER — Ambulatory Visit: Payer: Self-pay | Admitting: Internal Medicine

## 2007-05-15 ENCOUNTER — Encounter: Payer: Self-pay | Admitting: Internal Medicine

## 2007-08-09 ENCOUNTER — Inpatient Hospital Stay (HOSPITAL_COMMUNITY): Admission: EM | Admit: 2007-08-09 | Discharge: 2007-08-10 | Payer: Self-pay | Admitting: Emergency Medicine

## 2007-08-09 ENCOUNTER — Ambulatory Visit: Payer: Self-pay | Admitting: *Deleted

## 2007-09-24 ENCOUNTER — Ambulatory Visit: Payer: Self-pay | Admitting: Internal Medicine

## 2007-10-01 IMAGING — CR DG CHEST 2V
2 series · 2 of 2 positions shown · non-contrast
Comparison: 11/08/06.

CLINICAL DATA: 46 year-old-male with chest pain. 
 CHEST - 2 VIEW:

[view not recorded (1 of 2)]
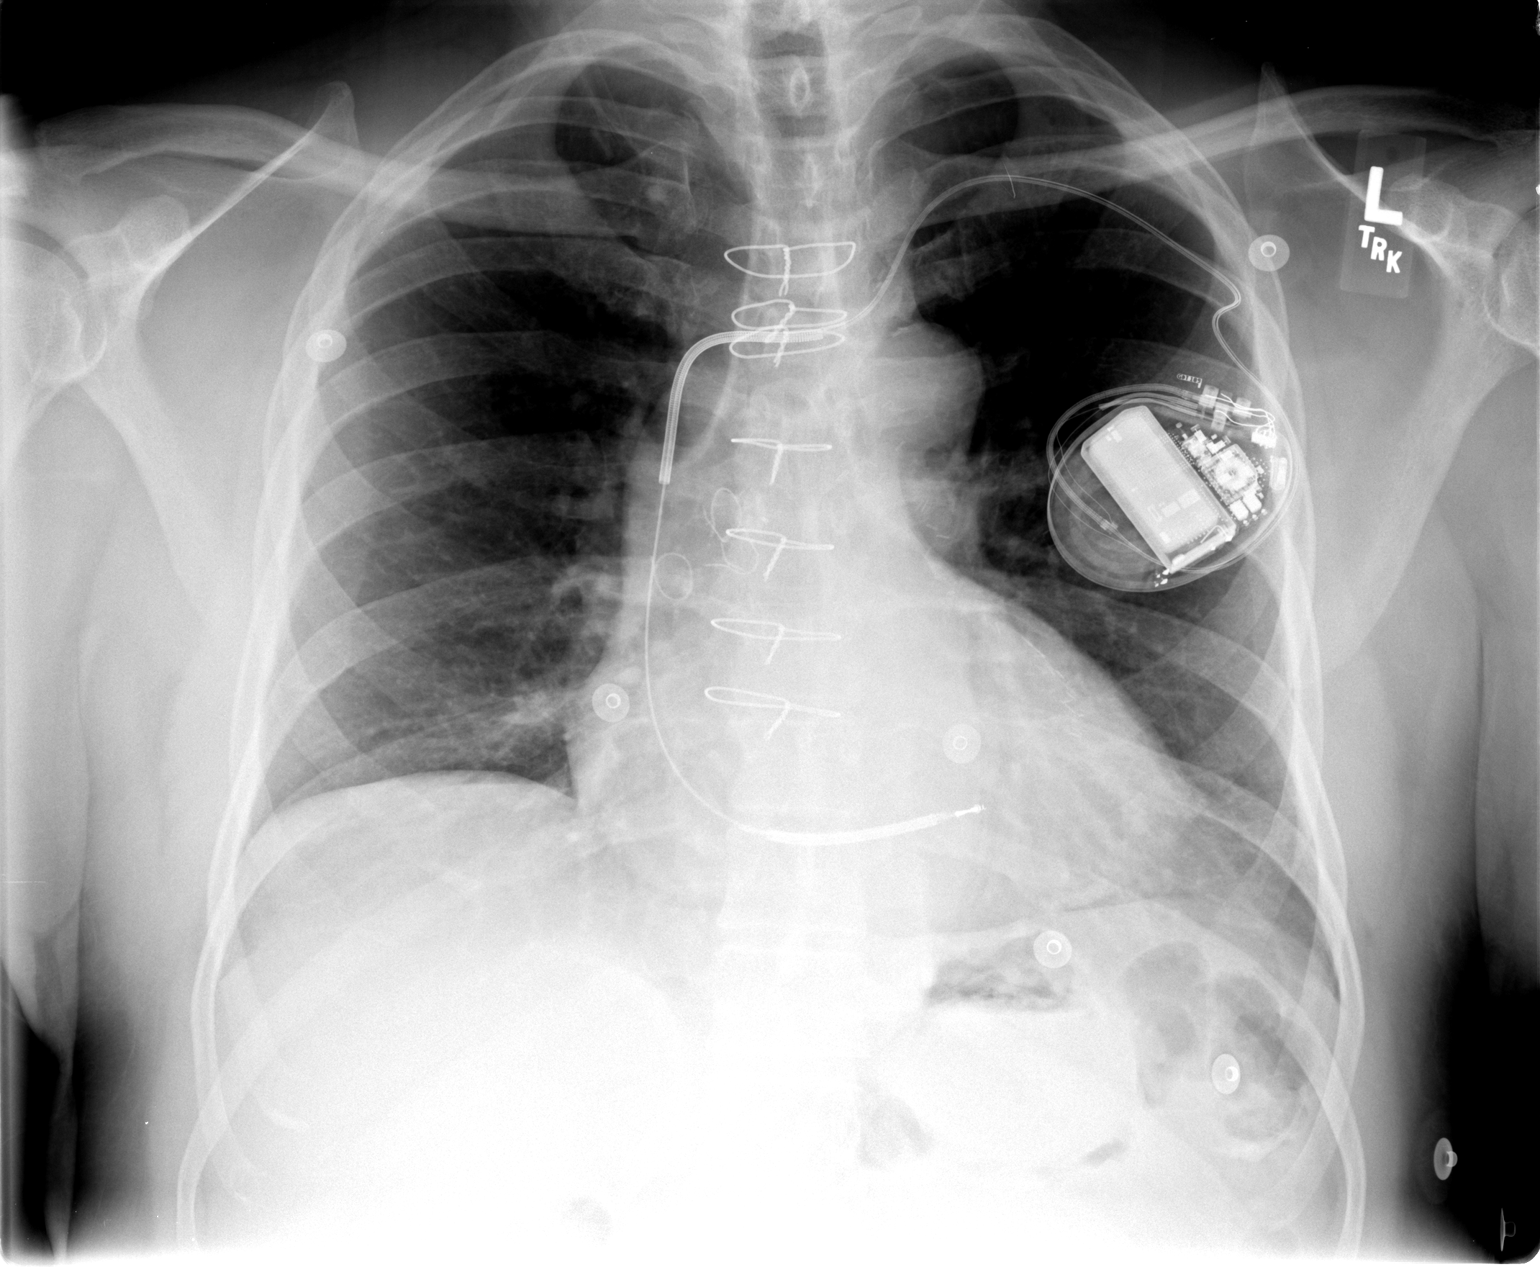

[view not recorded (2 of 2)]
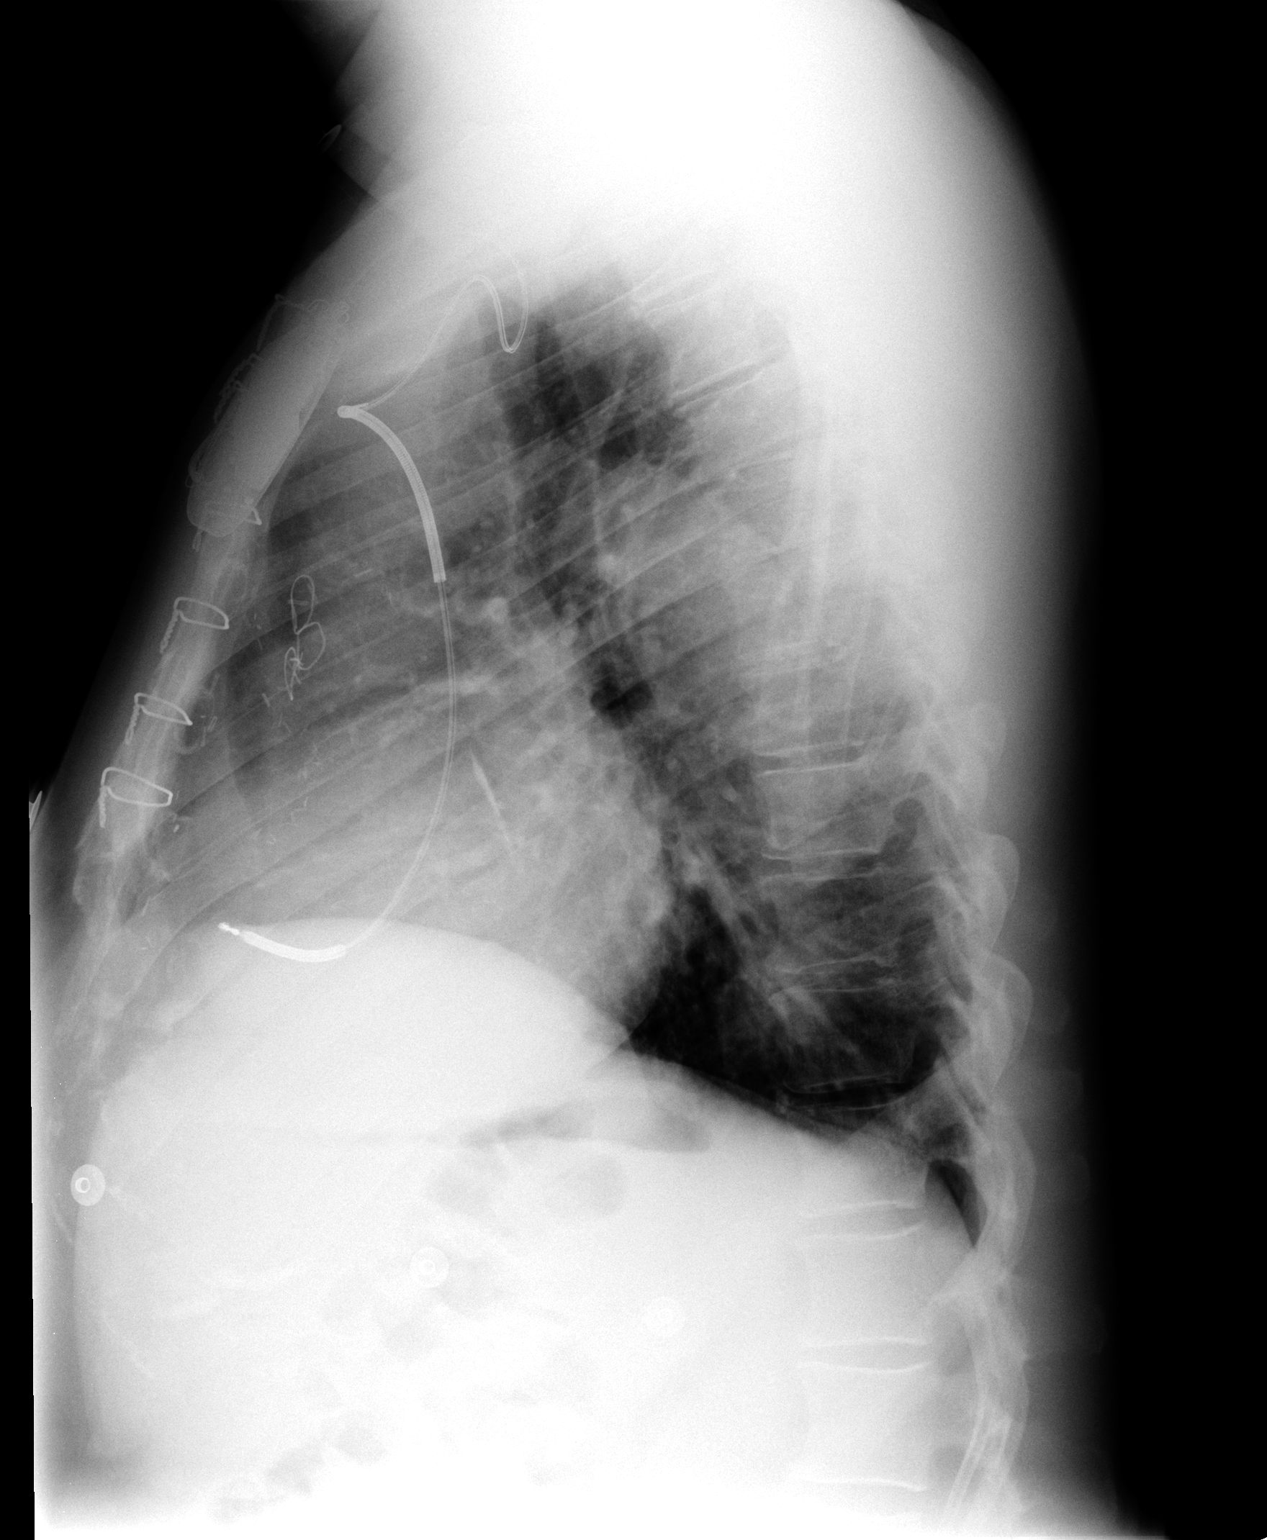

[2 of 2 positions shown; findings below may reference images not displayed]

FINDINGS: Pacer wire/AICD is stable.  The heart is upper limits of normal in size and unchanged.  Coronary stent is noted.  Stable surgical changes related to bypass surgery.  Minimal streaky basilar atelectasis.  No infiltrate, edema or effusion.
IMPRESSION: No acute cardiopulmonary findings.  Stable overall appearance of the chest since 6111.

## 2007-10-22 ENCOUNTER — Ambulatory Visit: Payer: Self-pay | Admitting: Cardiology

## 2007-10-22 ENCOUNTER — Inpatient Hospital Stay (HOSPITAL_COMMUNITY): Admission: EM | Admit: 2007-10-22 | Discharge: 2007-10-23 | Payer: Self-pay | Admitting: Emergency Medicine

## 2007-12-18 ENCOUNTER — Ambulatory Visit: Payer: Self-pay | Admitting: Internal Medicine

## 2007-12-18 ENCOUNTER — Observation Stay (HOSPITAL_COMMUNITY): Admission: EM | Admit: 2007-12-18 | Discharge: 2007-12-19 | Payer: Self-pay | Admitting: Emergency Medicine

## 2007-12-27 IMAGING — CR DG CHEST 2V
2 series · 2 of 2 positions shown · non-contrast
Comparison: none

CLINICAL DATA: 46 year-old, left arm pain.
 CHEST - 2 VIEW:

[w chest pa]
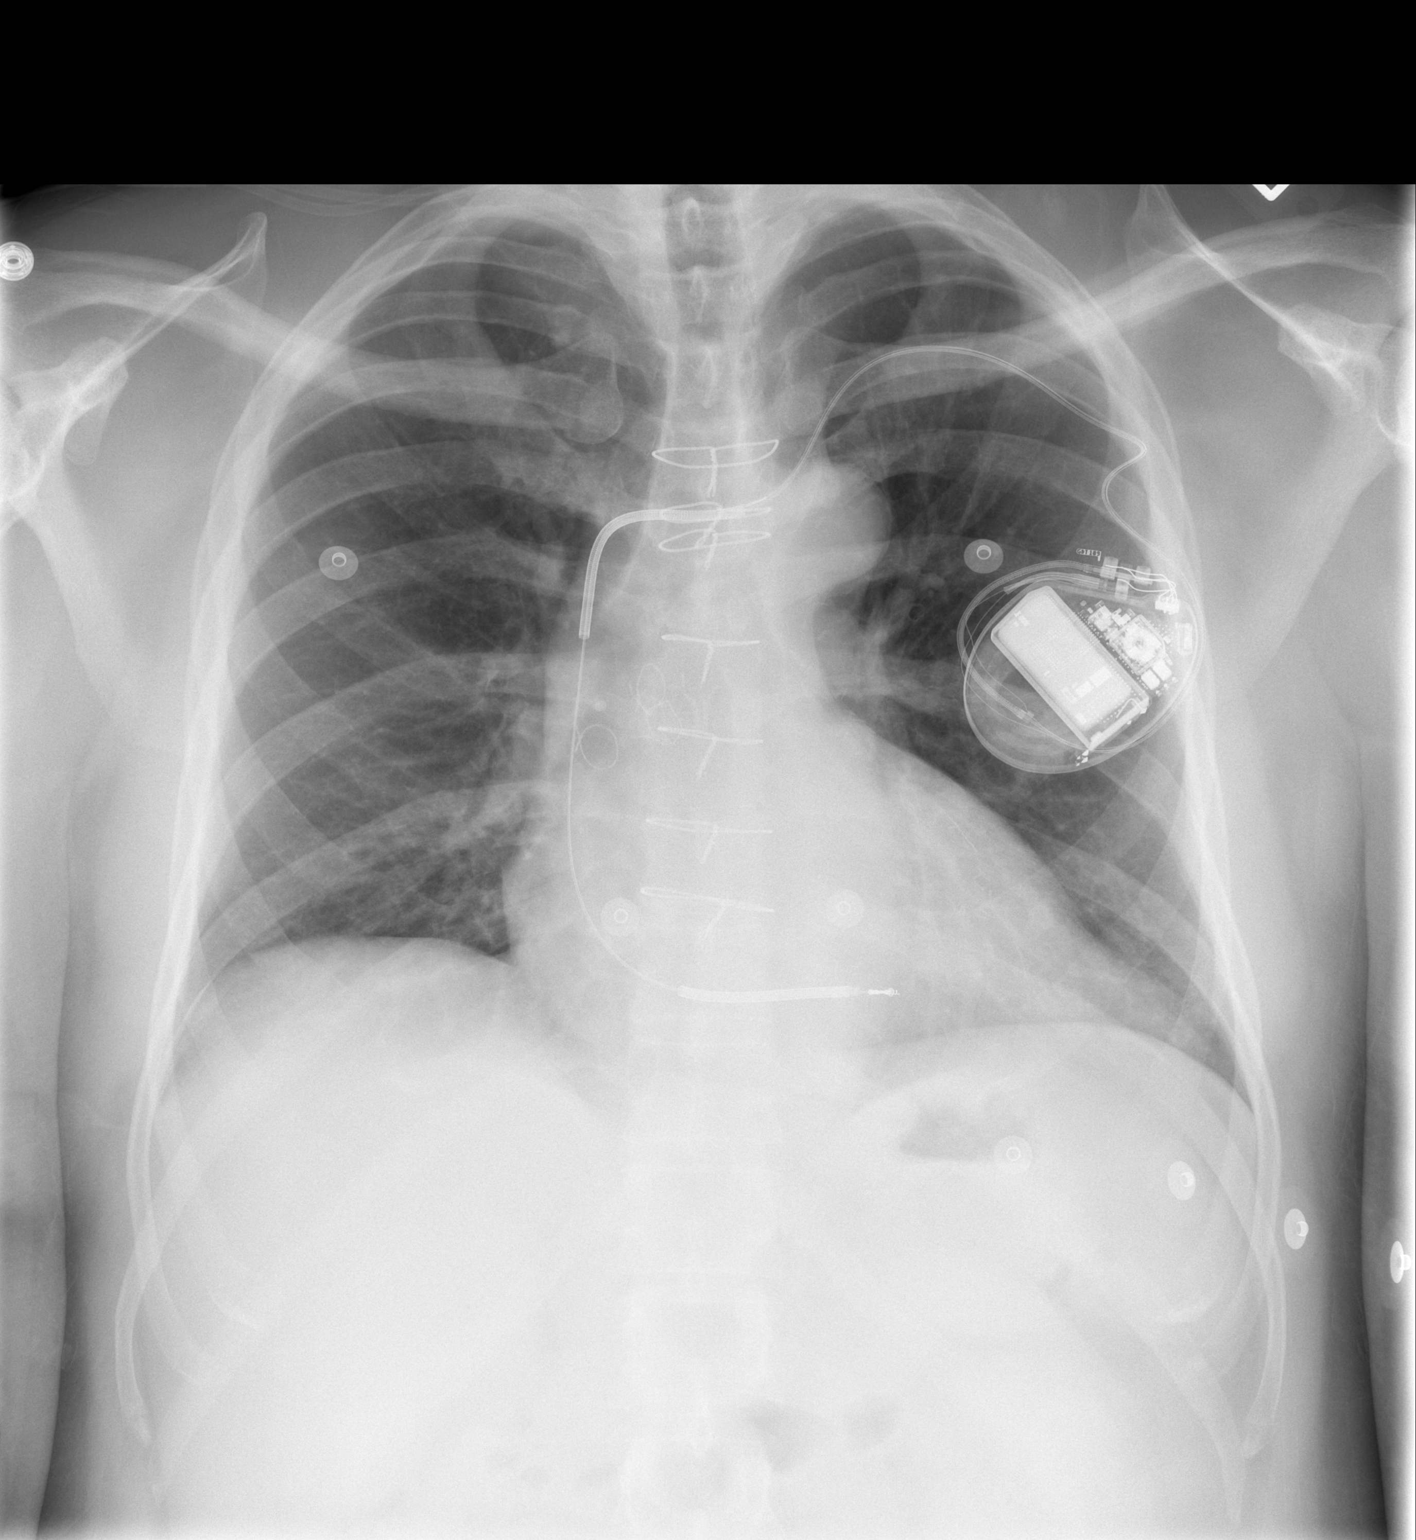

[w chest lat]
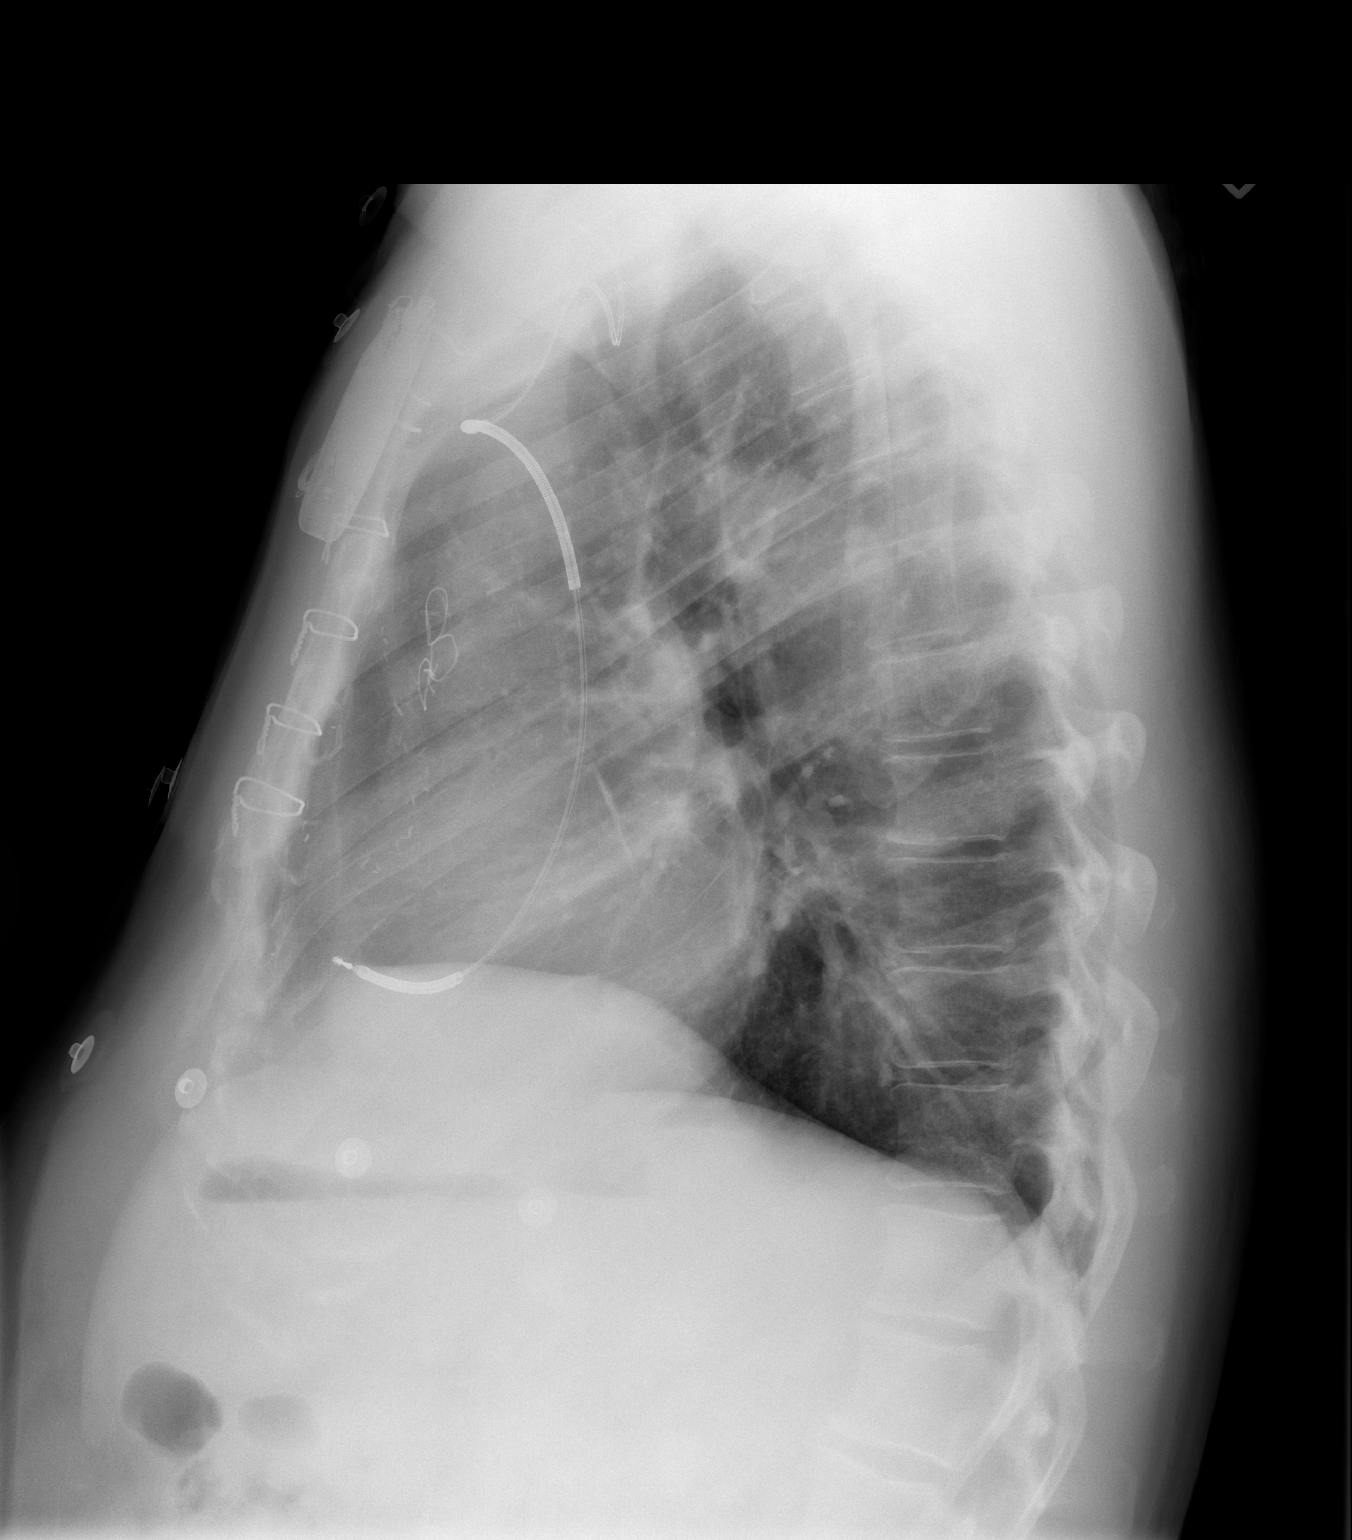

[2 of 2 positions shown; findings below may reference images not displayed]

FINDINGS: Cardiopericardial silhouette is within normal limits for size. A single lead AICD is in place.  The patient is status post median sternotomy for CABG. The lungs are clear.  The lung volumes are somewhat low.  No focal airspace disease is present.  The visualized soft tissues and bony thorax are unremarkable.
IMPRESSION: Low lung volumes without focal airspace disease.

## 2007-12-28 IMAGING — CT CT HEAD WO/W CM
2 of 3 series · 16 of 30 positions shown, 18 images · IV contrast (100 ML OMNI 300)
Comparison: 07/06/06.

CLINICAL DATA: 46-year-old male with headaches, question ? mass.
HEAD CT WITHOUT AND WITH CONTRAST:
TECHNIQUE: Contiguous axial images were obtained from the base of the skull through the vertex according to standard protocol before and after administration of intravenous contrast.
Contrast:  100 cc Omnipaque 300.

[Series 2: brain · axial · 0.47mm/px · z∈[+132,+254]mm · 8 of 32 slices shown, 10 images (1 of 2)]
[im 4/32  brain]
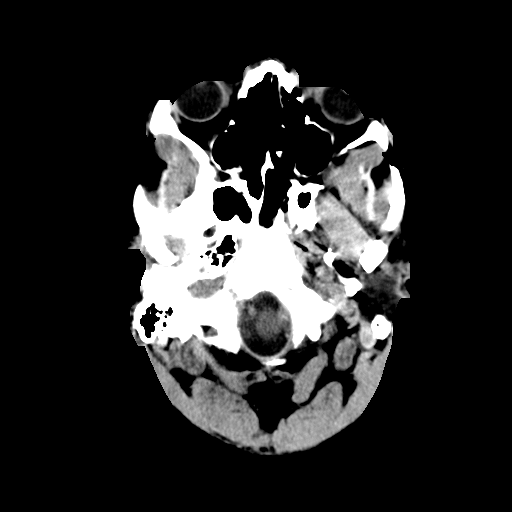
[im 4/32  bone]
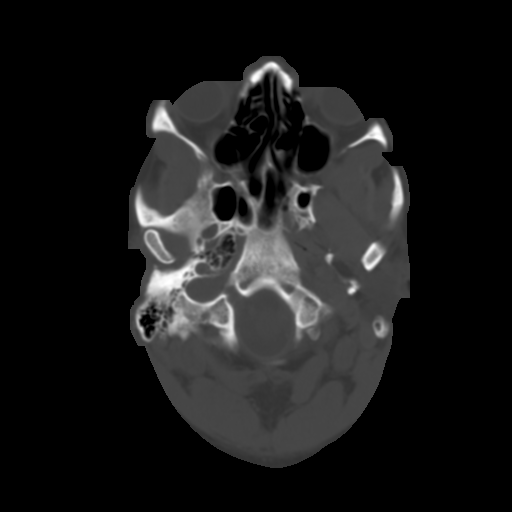
[im 7/32  brain]
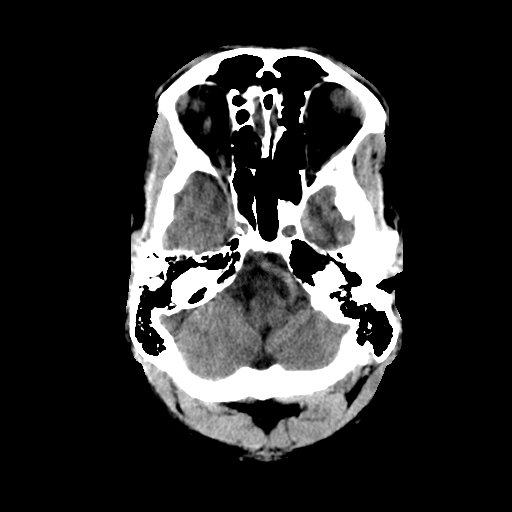
[im 11/32  brain]
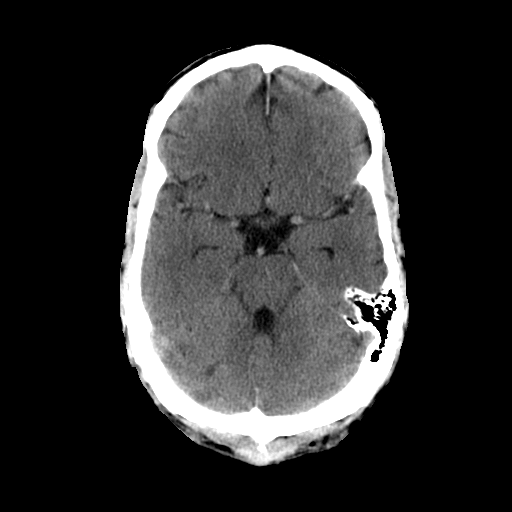
[im 14/32  brain]
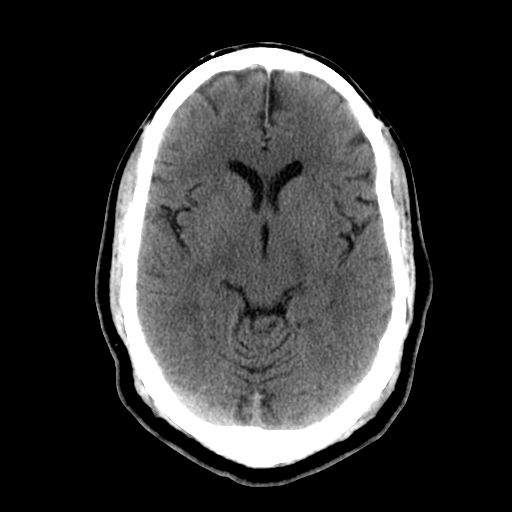
[im 18/32  brain]
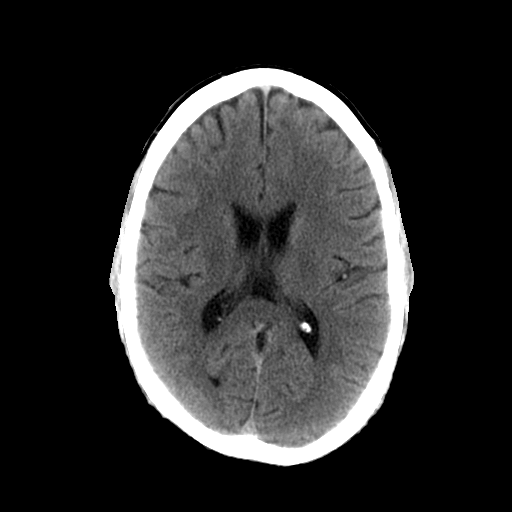
[im 18/32  bone]
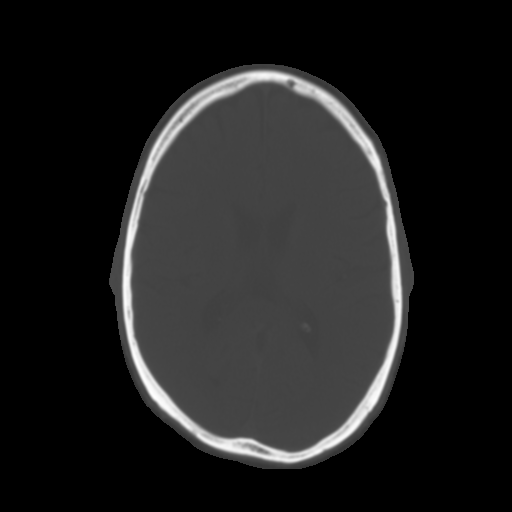
[im 21/32  brain]
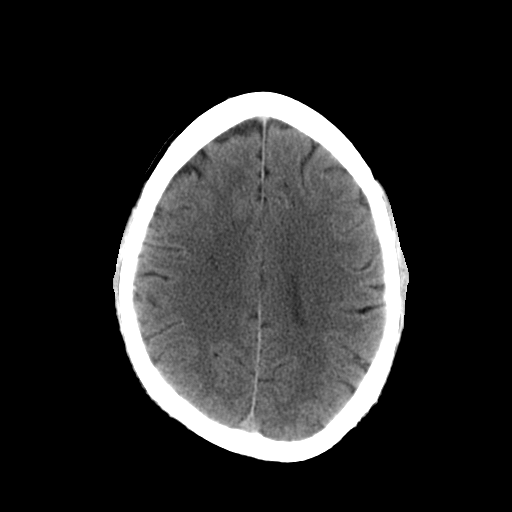
[im 25/32  brain]
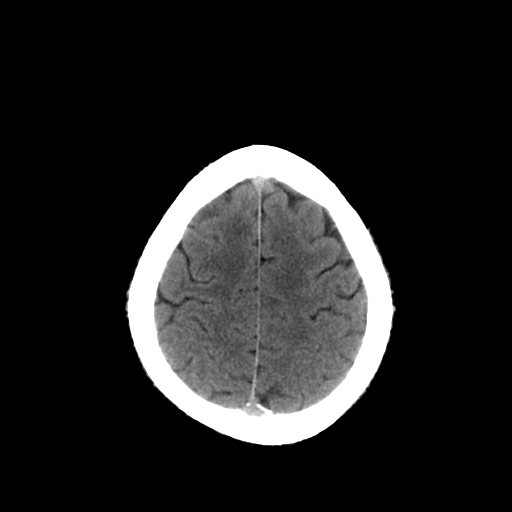
[im 28/32  brain]
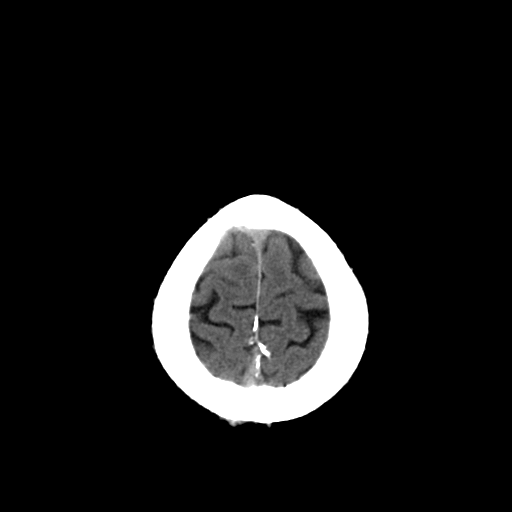

[Series 3: brain · axial · 0.47mm/px · z∈[+132,+254]mm · 8 of 32 slices shown (2 of 2)]
[im 4/32  brain]
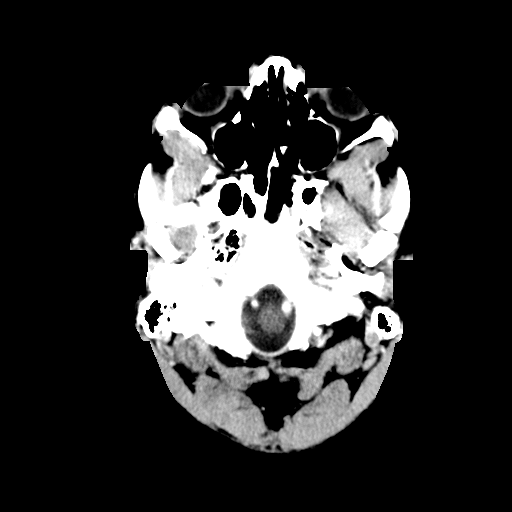
[im 7/32  brain]
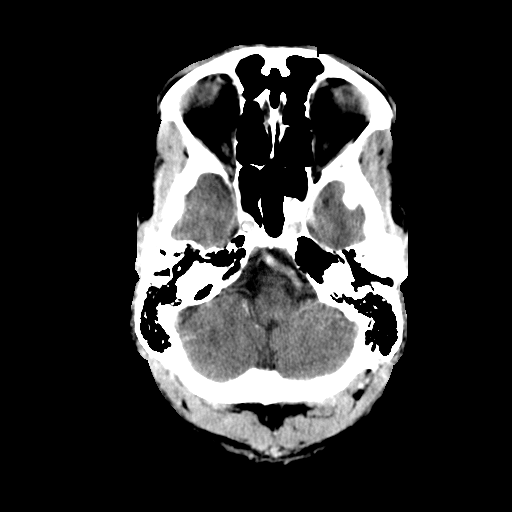
[im 11/32  brain]
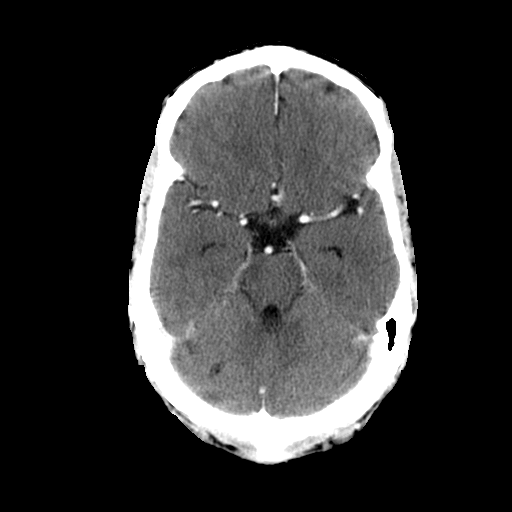
[im 14/32  brain]
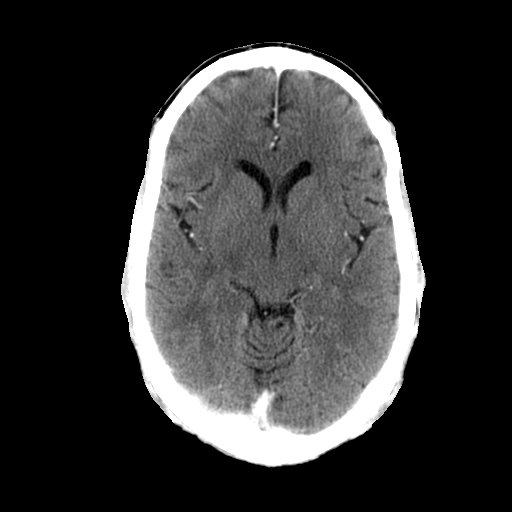
[im 18/32  brain]
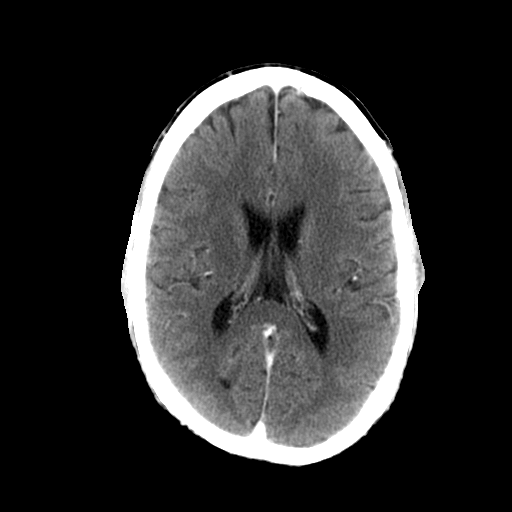
[im 21/32  brain]
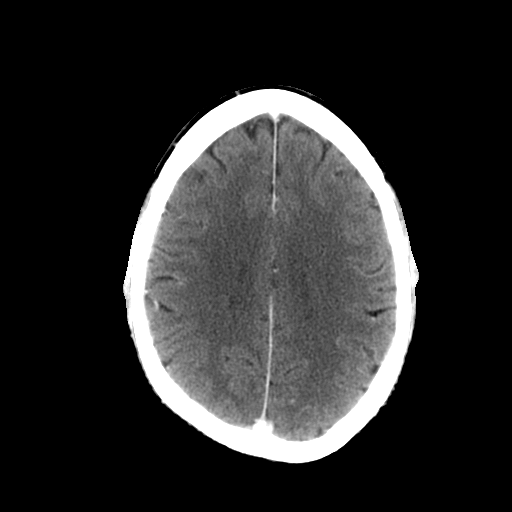
[im 25/32  brain]
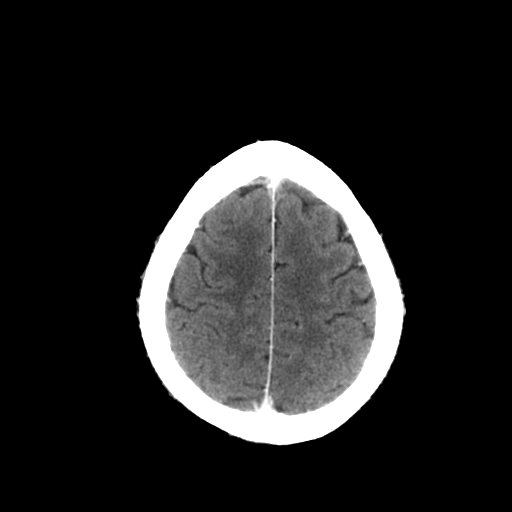
[im 28/32  brain]
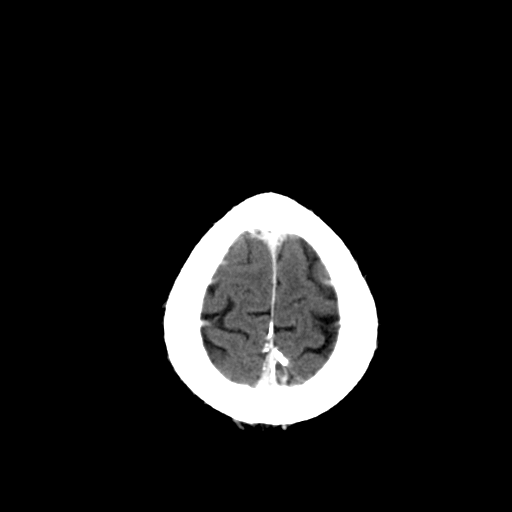

[16 of 30 positions shown; findings below may reference images not displayed]

FINDINGS: No midline shift.  Stable size and configuration of ventricles.  No intracranial hemorrhage or mass effect.  Preserved gray-white matter differentiation.  There is an unchanged chronic lacune in the right superior cerebellar hemisphere suggesting chronic small vessel disease.  No abnormal mass or enhancement is present.  Atherosclerotic calcifications of the carotid siphons, right greater than left, are noted.  Major vascular structures are normally enhancing.  The left vertebral artery is mildly dominant.  Normal orbits and scalp soft tissues.  Paranasal sinuses and mastoids are clear.  Incidental right concha bullosa.  No acute osseous abnormality.  There is debris in the right external auditory canal.
IMPRESSION: 1. No acute intracranial abnormality, mass or abnormal enhancement.
2. Stable chronic small vessel disease.

## 2007-12-30 ENCOUNTER — Encounter: Payer: Self-pay | Admitting: Internal Medicine

## 2007-12-30 ENCOUNTER — Ambulatory Visit: Payer: Self-pay

## 2008-01-14 ENCOUNTER — Ambulatory Visit: Payer: Self-pay | Admitting: Internal Medicine

## 2008-01-14 LAB — CONVERTED CEMR LAB
CO2: 29 meq/L (ref 19–32)
Calcium: 9.6 mg/dL (ref 8.4–10.5)
Chloride: 102 meq/L (ref 96–112)
Creatinine, Ser: 1.4 mg/dL (ref 0.4–1.5)
Glucose, Bld: 104 mg/dL — ABNORMAL HIGH (ref 70–99)

## 2008-01-27 ENCOUNTER — Ambulatory Visit: Payer: Self-pay | Admitting: Internal Medicine

## 2008-02-14 ENCOUNTER — Ambulatory Visit: Payer: Self-pay | Admitting: Cardiology

## 2008-02-14 ENCOUNTER — Inpatient Hospital Stay (HOSPITAL_COMMUNITY): Admission: EM | Admit: 2008-02-14 | Discharge: 2008-02-17 | Payer: Self-pay | Admitting: Emergency Medicine

## 2008-03-10 IMAGING — CR DG CHEST 2V
2 series · 2 of 2 positions shown · non-contrast
Comparison: 08/09/2007.

CLINICAL DATA: Chest pain. Shortness of breath. Cough. Asthma. Chronic
bronchitis. Hypertension.

CHEST - 2 VIEW

[w chest pa]
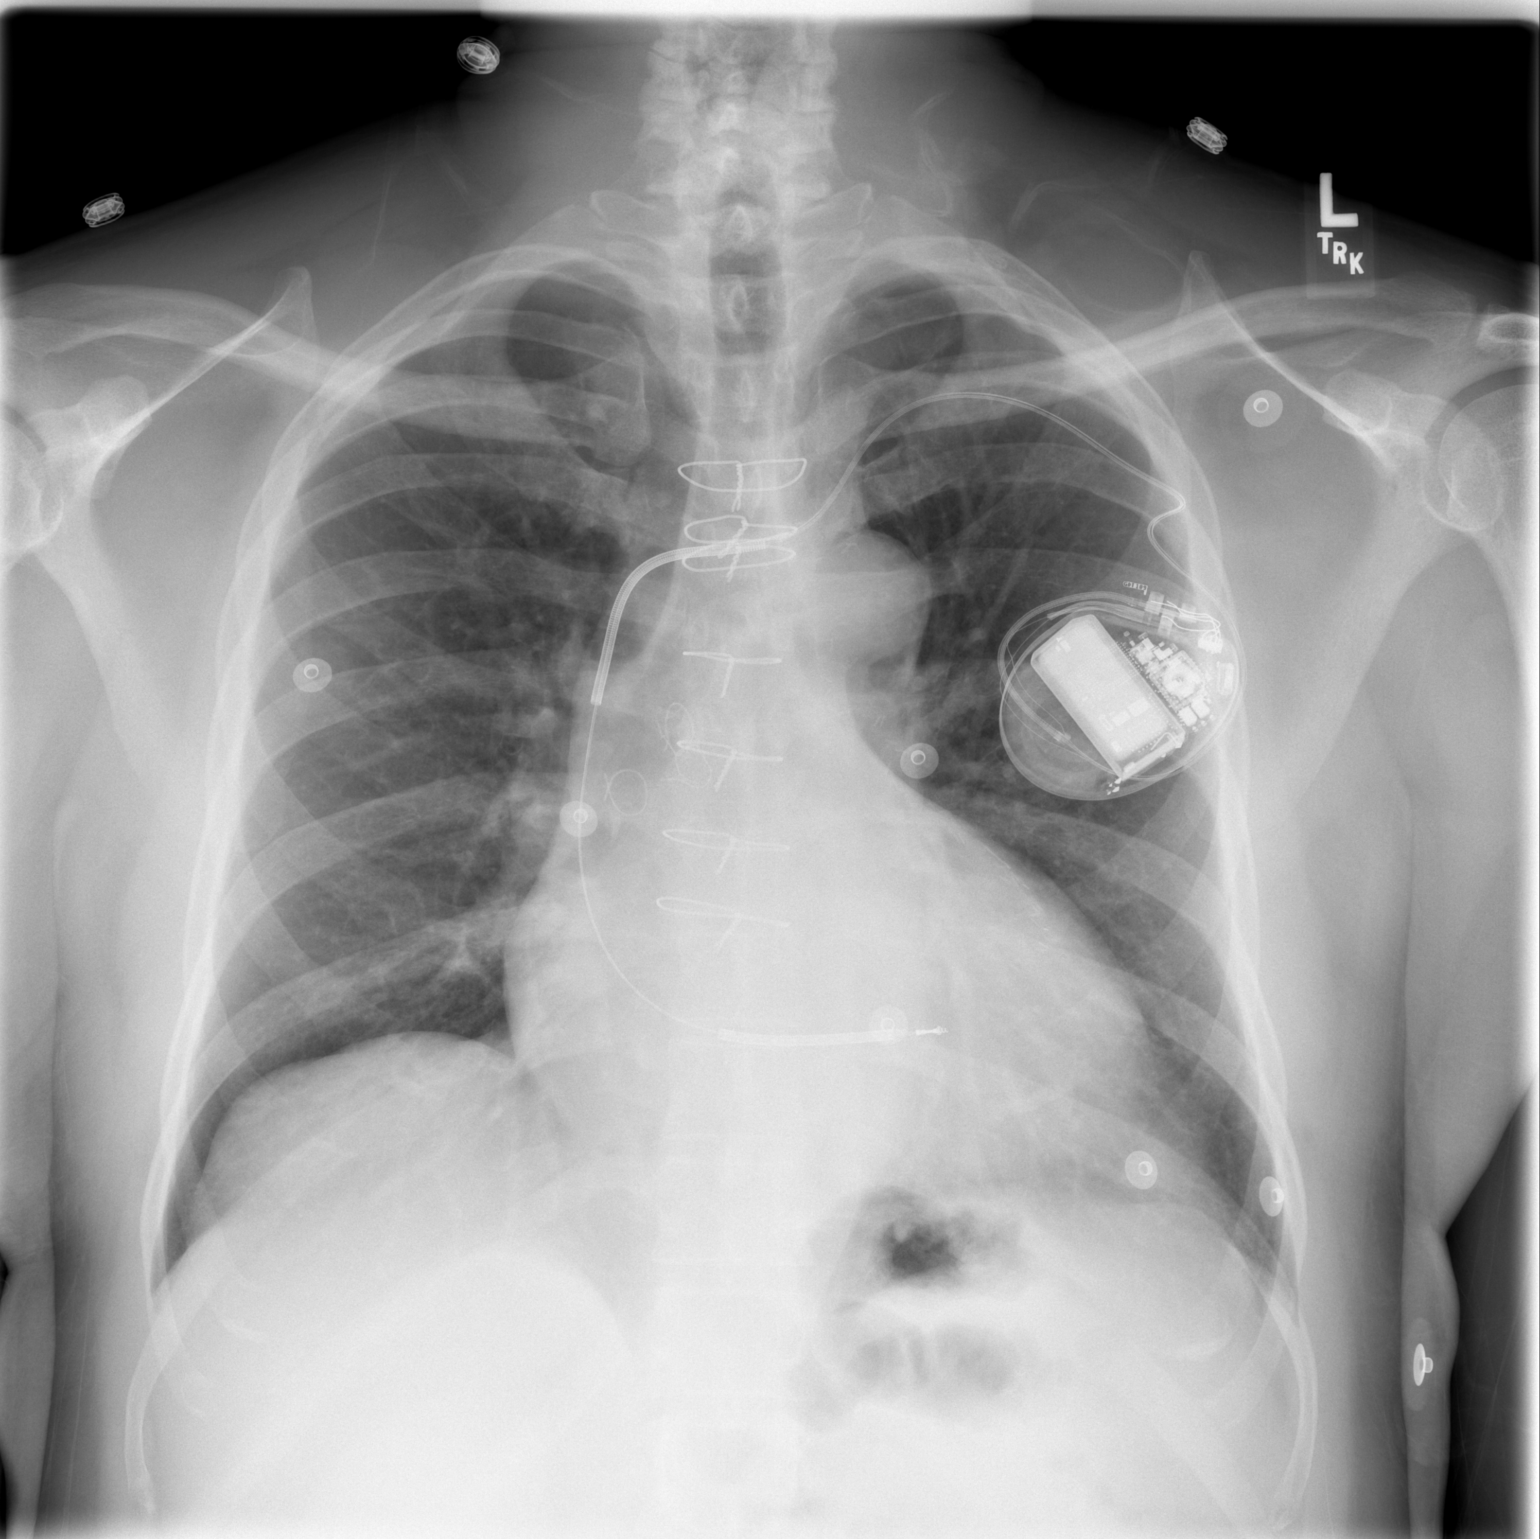

[w chest lat]
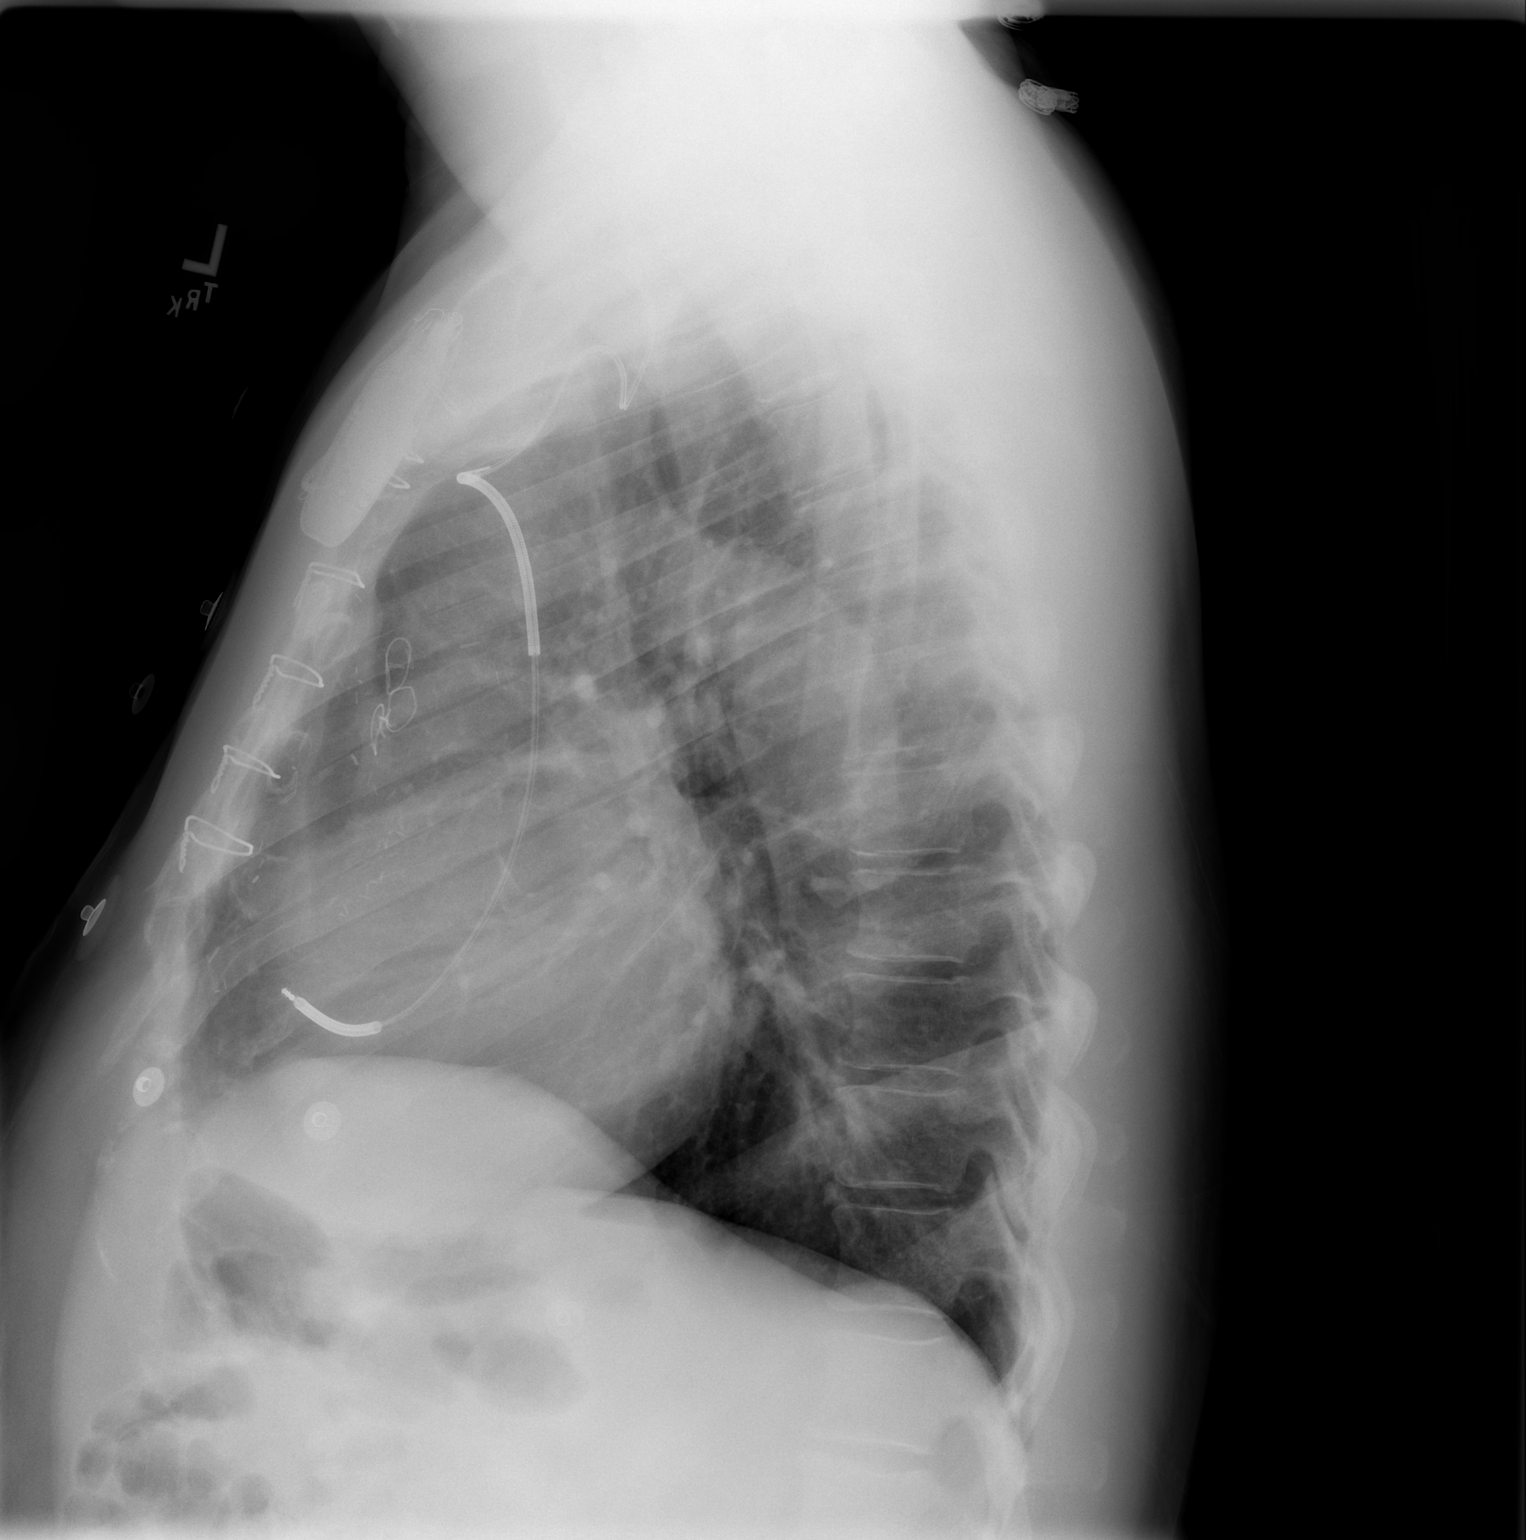

[2 of 2 positions shown; findings below may reference images not displayed]

FINDINGS: Mildly enlarged cardiac silhouette with a mild increase in size.
Stable post CABG changes and stable left subclavian AICD lead. Clear lungs with
normal vascularity. Unremarkable bones.

IMPRESSION

Mild cardiomegaly with a mild increase in size. Otherwise, unremarkable
examination.

## 2008-06-02 ENCOUNTER — Ambulatory Visit: Payer: Self-pay | Admitting: Internal Medicine

## 2008-06-10 ENCOUNTER — Encounter: Payer: Self-pay | Admitting: Internal Medicine

## 2008-06-10 ENCOUNTER — Ambulatory Visit: Payer: Self-pay

## 2008-06-30 ENCOUNTER — Ambulatory Visit: Payer: Self-pay | Admitting: Cardiovascular Disease

## 2008-06-30 ENCOUNTER — Observation Stay (HOSPITAL_COMMUNITY): Admission: EM | Admit: 2008-06-30 | Discharge: 2008-07-01 | Payer: Self-pay | Admitting: Emergency Medicine

## 2008-07-03 IMAGING — CR DG CHEST 2V
2 series · 2 of 2 positions shown · non-contrast
Comparison: 12/18/07.

CLINICAL DATA: Chest pain. 
 CHEST - 2 VIEW: 
 PA and lateral chest - 02/14/08.

[w chest pa]
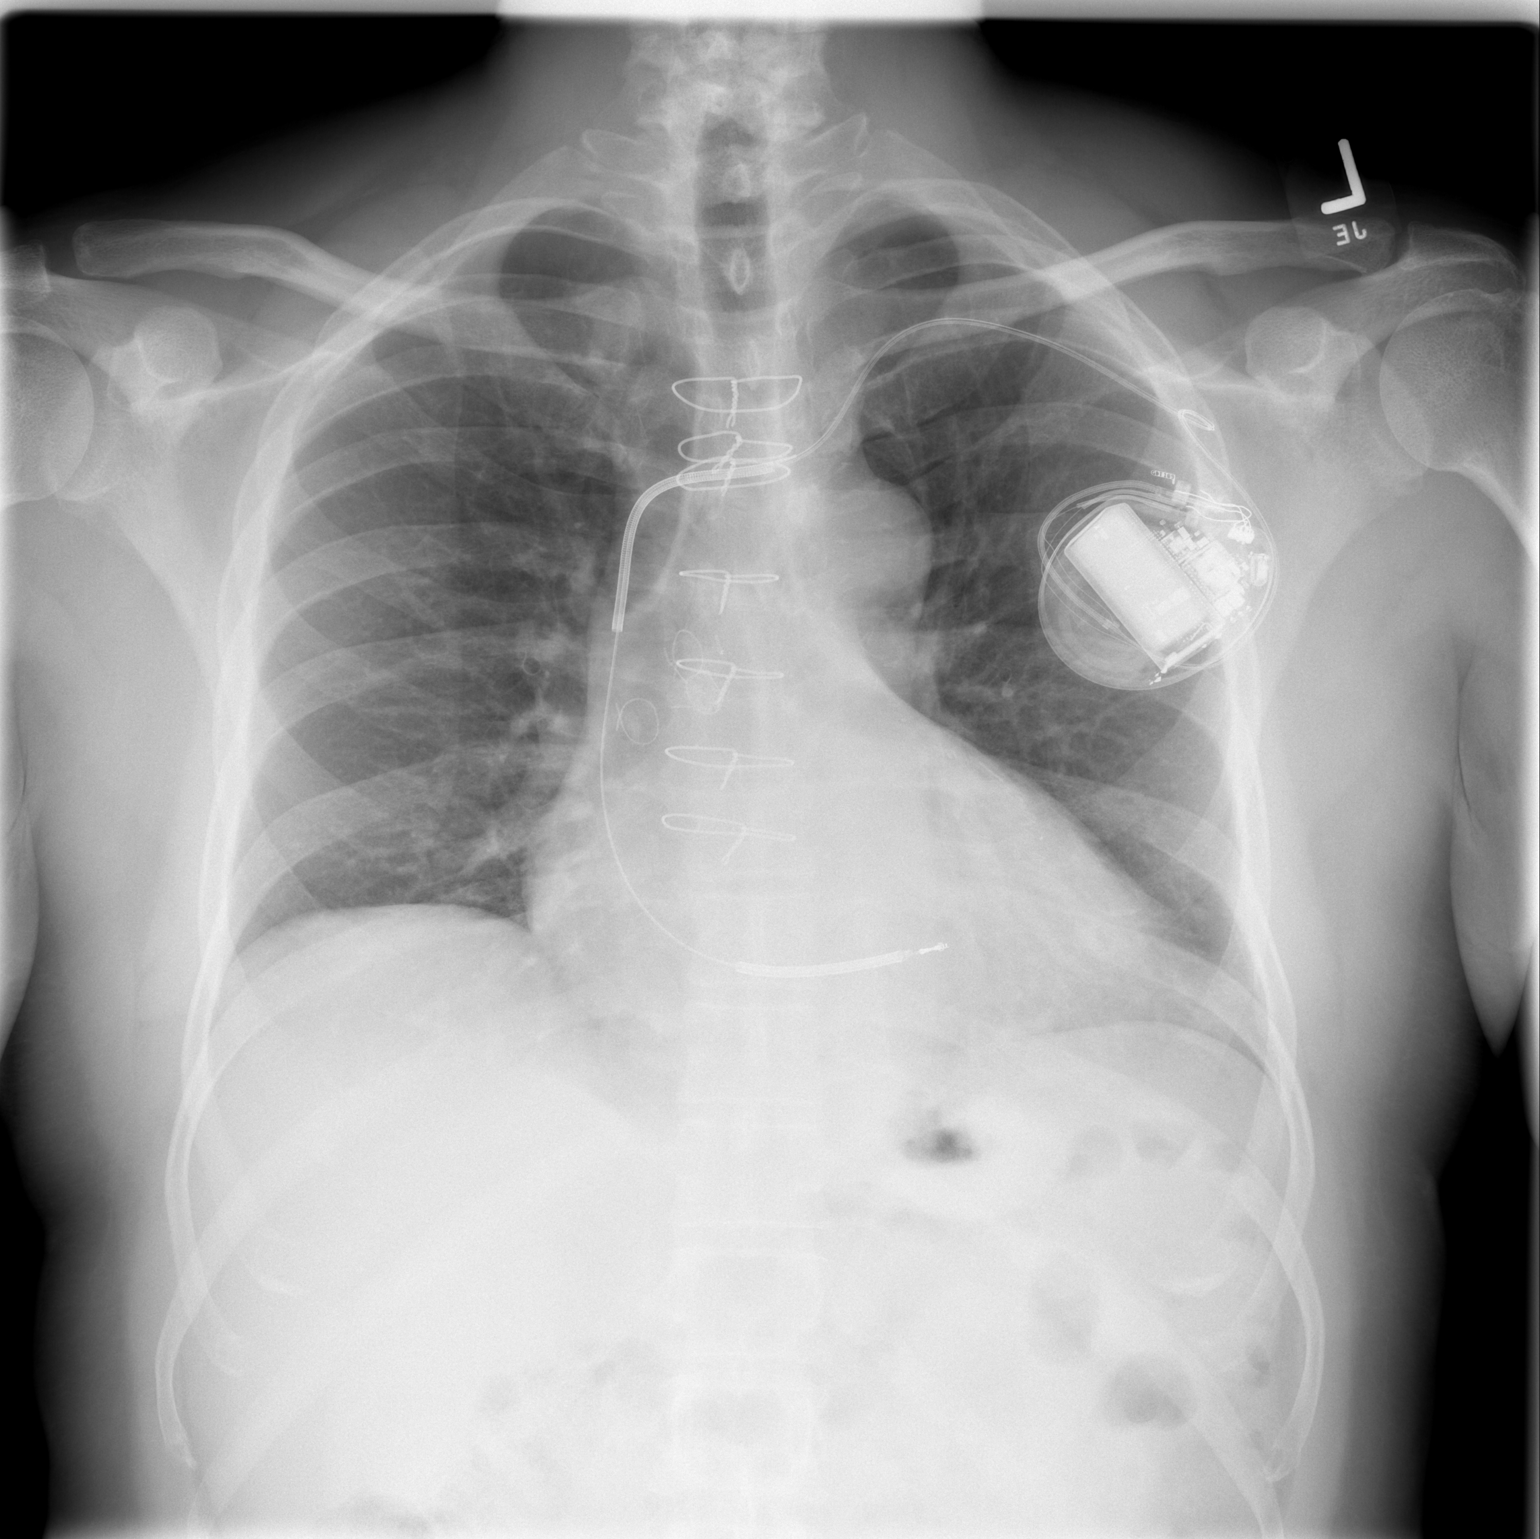

[w chest lat]
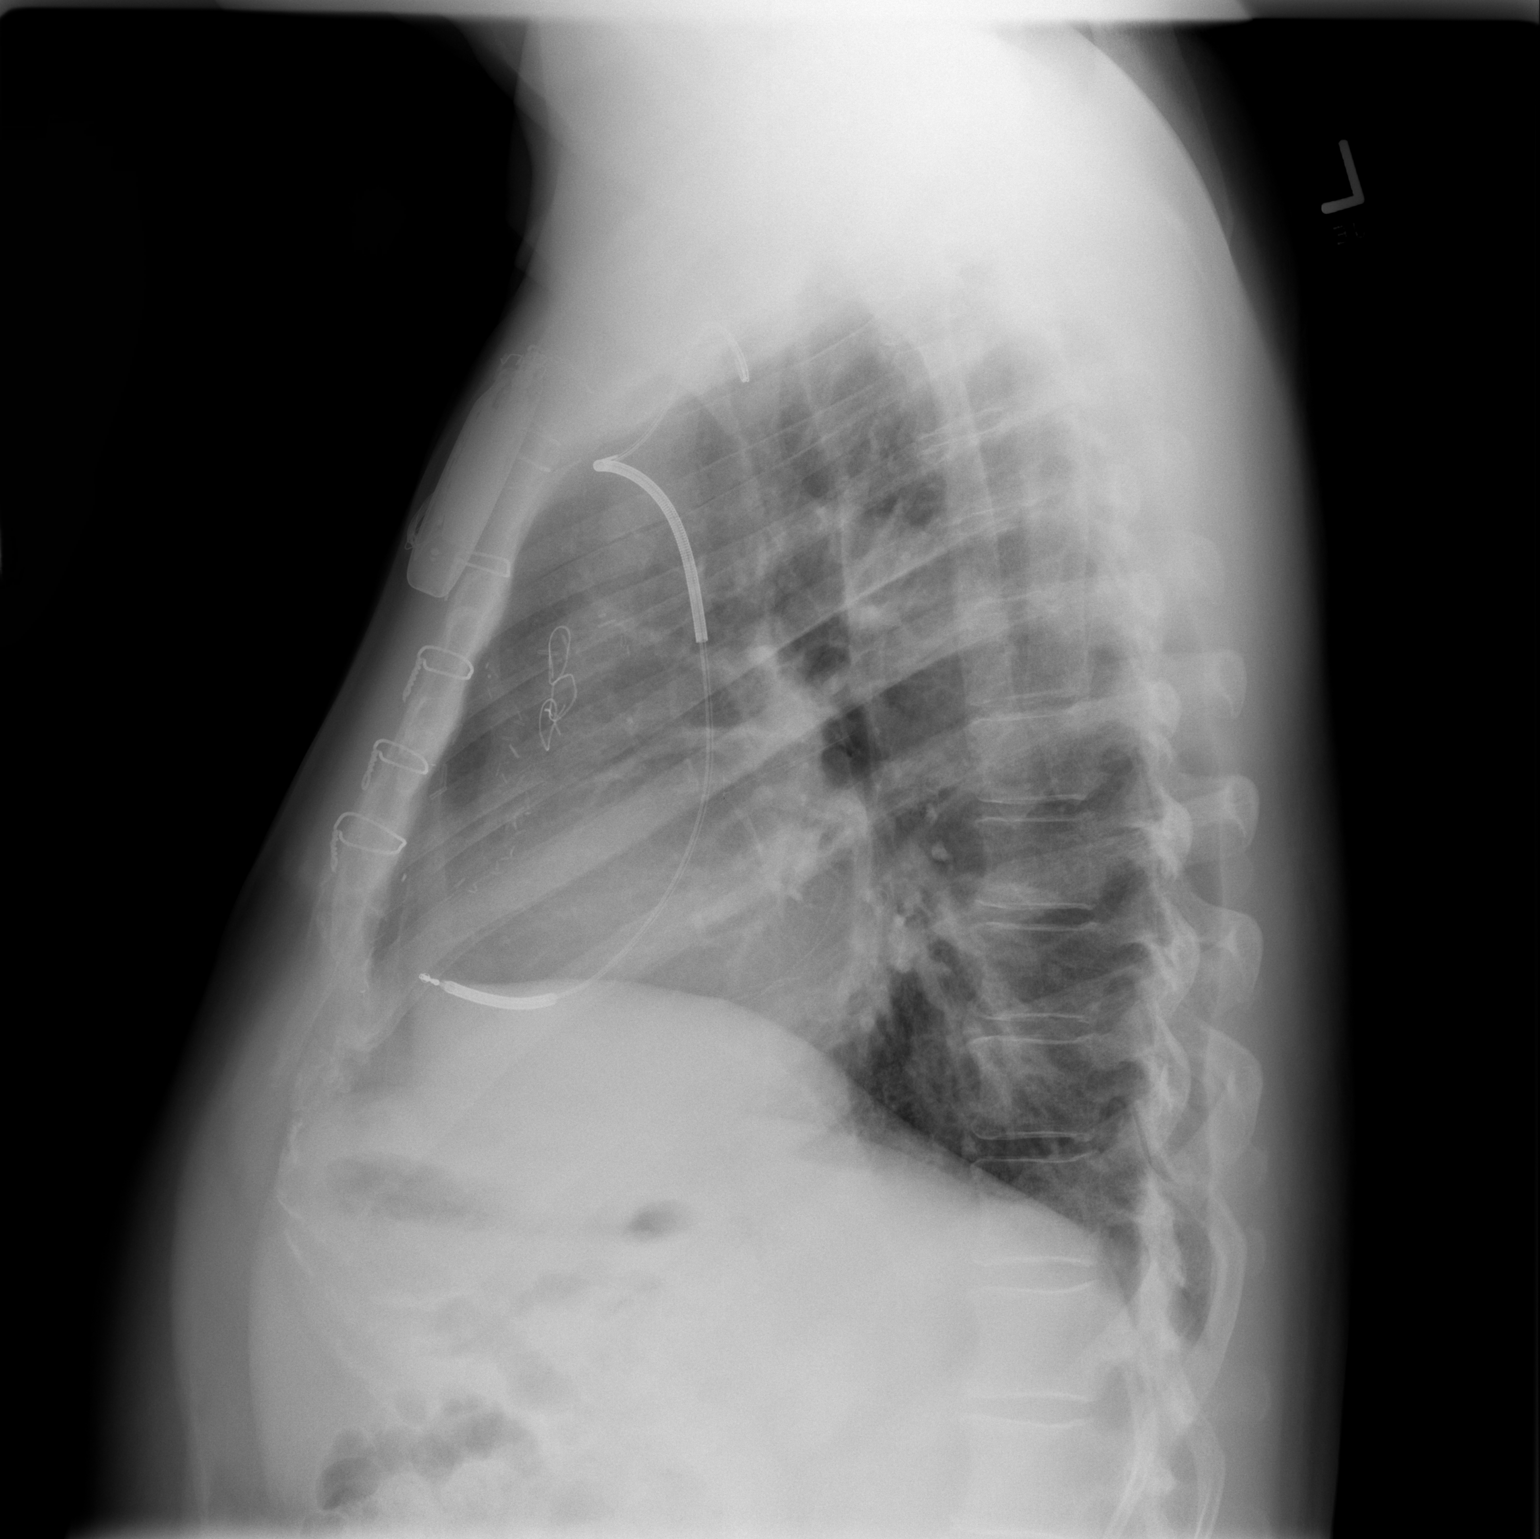

[2 of 2 positions shown; findings below may reference images not displayed]

FINDINGS: There is cardiomegaly but no edema.  An AICD is in place, unchanged.  The patient is status post CABG.
IMPRESSION: Cardiomegaly without acute disease.

## 2008-07-15 ENCOUNTER — Ambulatory Visit: Payer: Self-pay

## 2008-08-25 ENCOUNTER — Ambulatory Visit: Payer: Self-pay | Admitting: Internal Medicine

## 2008-09-29 DIAGNOSIS — I25810 Atherosclerosis of coronary artery bypass graft(s) without angina pectoris: Secondary | ICD-10-CM

## 2008-09-29 DIAGNOSIS — I5022 Chronic systolic (congestive) heart failure: Secondary | ICD-10-CM

## 2008-09-29 DIAGNOSIS — I10 Essential (primary) hypertension: Secondary | ICD-10-CM | POA: Insufficient documentation

## 2008-09-29 DIAGNOSIS — Z95810 Presence of automatic (implantable) cardiac defibrillator: Secondary | ICD-10-CM | POA: Insufficient documentation

## 2008-09-29 DIAGNOSIS — E785 Hyperlipidemia, unspecified: Secondary | ICD-10-CM

## 2008-10-06 ENCOUNTER — Ambulatory Visit: Payer: Self-pay | Admitting: Internal Medicine

## 2008-11-17 IMAGING — CR DG CHEST 2V
2 series · 2 of 2 positions shown · non-contrast
Comparison: 02/14/2008

CLINICAL DATA: Chest pain, shortness of breath

CHEST - 2 VIEW

[view not recorded (1 of 2)]
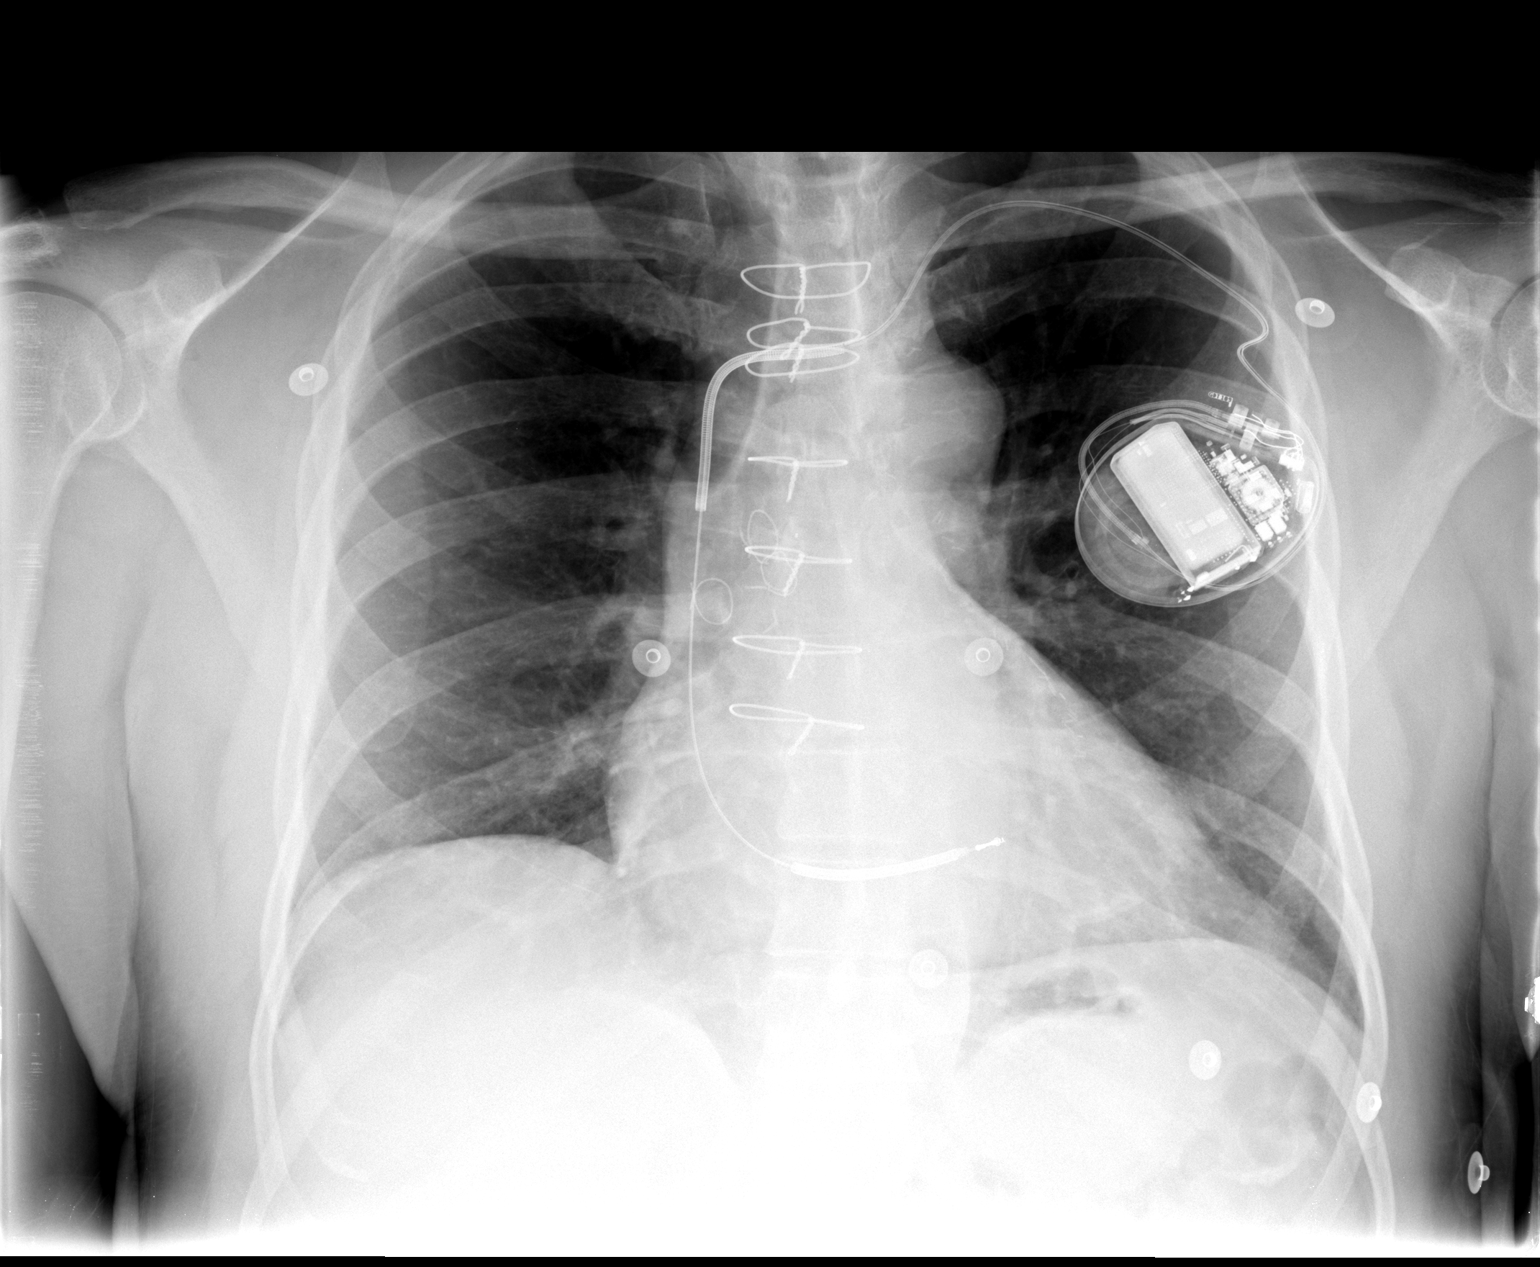

[view not recorded (2 of 2)]
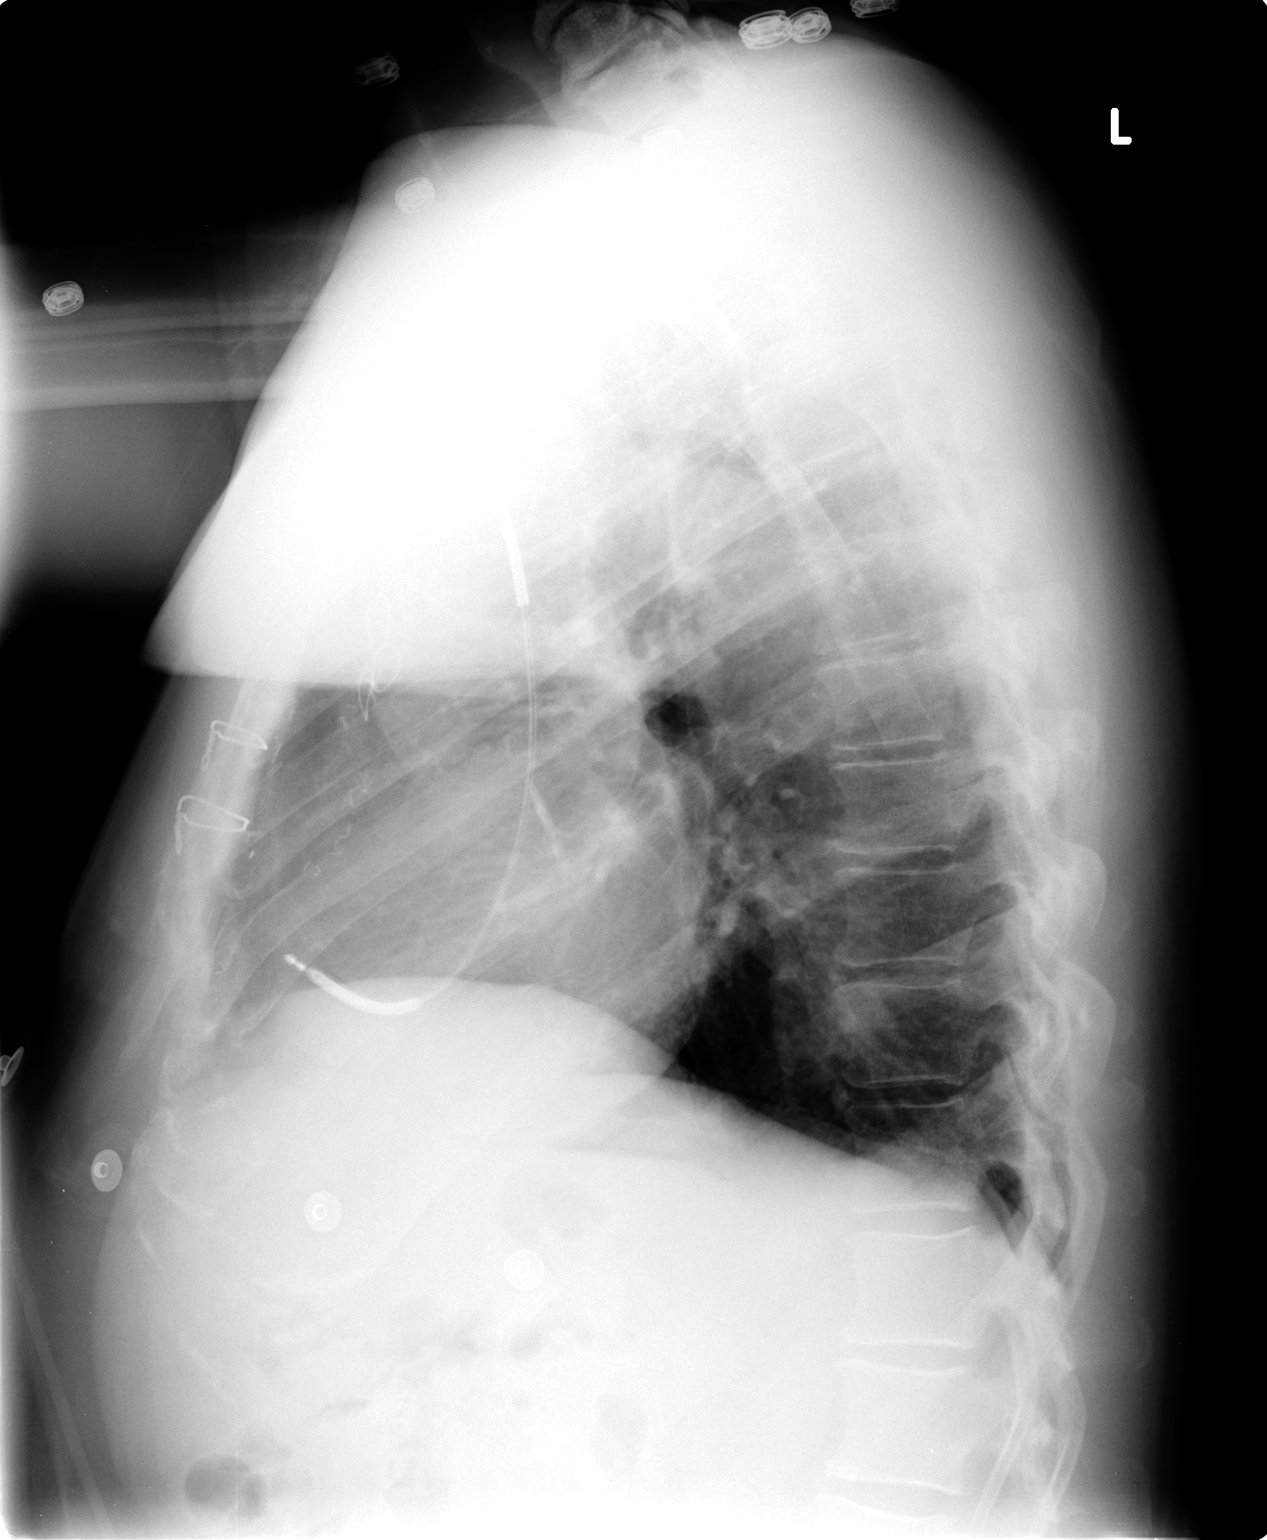

[2 of 2 positions shown; findings below may reference images not displayed]

FINDINGS: Left AICD is unchanged.  The patient is status post CABG.
There is mild cardiomegaly.  No focal opacities, effusions, or
edema.  No acute findings.
IMPRESSION: No active disease.  Mild cardiomegaly.

## 2008-11-26 ENCOUNTER — Ambulatory Visit: Payer: Self-pay

## 2009-01-16 ENCOUNTER — Emergency Department (HOSPITAL_COMMUNITY): Admission: EM | Admit: 2009-01-16 | Discharge: 2009-01-16 | Payer: Self-pay | Admitting: Emergency Medicine

## 2009-02-04 ENCOUNTER — Encounter: Payer: Self-pay | Admitting: Internal Medicine

## 2009-02-22 ENCOUNTER — Ambulatory Visit: Payer: Self-pay

## 2009-03-06 ENCOUNTER — Emergency Department (HOSPITAL_COMMUNITY): Admission: EM | Admit: 2009-03-06 | Discharge: 2009-03-06 | Payer: Self-pay | Admitting: Emergency Medicine

## 2009-04-22 ENCOUNTER — Encounter: Payer: Self-pay | Admitting: Internal Medicine

## 2009-04-24 ENCOUNTER — Emergency Department (HOSPITAL_COMMUNITY): Admission: EM | Admit: 2009-04-24 | Discharge: 2009-04-24 | Payer: Self-pay | Admitting: Emergency Medicine

## 2009-04-27 ENCOUNTER — Emergency Department (HOSPITAL_COMMUNITY): Admission: EM | Admit: 2009-04-27 | Discharge: 2009-04-27 | Payer: Self-pay | Admitting: Emergency Medicine

## 2009-05-04 ENCOUNTER — Inpatient Hospital Stay (HOSPITAL_COMMUNITY): Admission: EM | Admit: 2009-05-04 | Discharge: 2009-05-06 | Payer: Self-pay | Admitting: Emergency Medicine

## 2009-05-04 ENCOUNTER — Ambulatory Visit: Payer: Self-pay | Admitting: Cardiology

## 2009-05-05 ENCOUNTER — Ambulatory Visit: Payer: Self-pay | Admitting: Vascular Surgery

## 2009-05-05 ENCOUNTER — Encounter (INDEPENDENT_AMBULATORY_CARE_PROVIDER_SITE_OTHER): Payer: Self-pay | Admitting: Internal Medicine

## 2009-05-13 ENCOUNTER — Encounter: Payer: Self-pay | Admitting: Internal Medicine

## 2009-05-18 ENCOUNTER — Ambulatory Visit: Payer: Self-pay | Admitting: Internal Medicine

## 2009-05-18 DIAGNOSIS — M5430 Sciatica, unspecified side: Secondary | ICD-10-CM

## 2009-06-01 ENCOUNTER — Inpatient Hospital Stay (HOSPITAL_COMMUNITY): Admission: EM | Admit: 2009-06-01 | Discharge: 2009-06-03 | Payer: Self-pay | Admitting: Emergency Medicine

## 2009-06-12 ENCOUNTER — Emergency Department (HOSPITAL_COMMUNITY): Admission: EM | Admit: 2009-06-12 | Discharge: 2009-06-13 | Payer: Self-pay | Admitting: Emergency Medicine

## 2009-06-21 ENCOUNTER — Inpatient Hospital Stay (HOSPITAL_COMMUNITY): Admission: EM | Admit: 2009-06-21 | Discharge: 2009-06-30 | Payer: Self-pay | Admitting: Emergency Medicine

## 2009-06-21 ENCOUNTER — Encounter: Payer: Self-pay | Admitting: Internal Medicine

## 2009-06-21 ENCOUNTER — Ambulatory Visit: Payer: Self-pay | Admitting: Internal Medicine

## 2009-06-21 ENCOUNTER — Encounter: Payer: Self-pay | Admitting: Cardiothoracic Surgery

## 2009-06-21 ENCOUNTER — Ambulatory Visit: Payer: Self-pay | Admitting: Cardiothoracic Surgery

## 2009-06-21 HISTORY — PX: CORONARY ARTERY BYPASS GRAFT: SHX141

## 2009-06-25 ENCOUNTER — Telehealth (INDEPENDENT_AMBULATORY_CARE_PROVIDER_SITE_OTHER): Payer: Self-pay | Admitting: *Deleted

## 2009-06-28 ENCOUNTER — Encounter: Payer: Self-pay | Admitting: Internal Medicine

## 2009-07-01 ENCOUNTER — Inpatient Hospital Stay (HOSPITAL_COMMUNITY): Admission: EM | Admit: 2009-07-01 | Discharge: 2009-07-04 | Payer: Self-pay | Admitting: Emergency Medicine

## 2009-07-01 ENCOUNTER — Ambulatory Visit: Payer: Self-pay | Admitting: Cardiology

## 2009-07-01 ENCOUNTER — Encounter: Payer: Self-pay | Admitting: Internal Medicine

## 2009-07-04 ENCOUNTER — Encounter: Payer: Self-pay | Admitting: Cardiology

## 2009-07-06 ENCOUNTER — Telehealth: Payer: Self-pay | Admitting: Internal Medicine

## 2009-07-13 ENCOUNTER — Encounter: Payer: Self-pay | Admitting: Internal Medicine

## 2009-07-14 ENCOUNTER — Ambulatory Visit: Payer: Self-pay | Admitting: Internal Medicine

## 2009-07-14 DIAGNOSIS — I7103 Dissection of thoracoabdominal aorta: Secondary | ICD-10-CM

## 2009-07-15 ENCOUNTER — Encounter: Admission: RE | Admit: 2009-07-15 | Discharge: 2009-07-15 | Payer: Self-pay | Admitting: Neurosurgery

## 2009-07-17 ENCOUNTER — Inpatient Hospital Stay (HOSPITAL_COMMUNITY): Admission: EM | Admit: 2009-07-17 | Discharge: 2009-07-28 | Payer: Self-pay | Admitting: Emergency Medicine

## 2009-07-17 ENCOUNTER — Ambulatory Visit: Payer: Self-pay | Admitting: Cardiology

## 2009-07-17 ENCOUNTER — Encounter: Payer: Self-pay | Admitting: Internal Medicine

## 2009-07-23 ENCOUNTER — Encounter: Payer: Self-pay | Admitting: Internal Medicine

## 2009-07-24 IMAGING — CR DG THORACIC SPINE 2V
3 series · 3 of 3 positions shown · non-contrast
Comparison: 06/30/2008

CLINICAL DATA: Pain

THORACIC SPINE - 2 VIEW

[t t-spine a.p.]
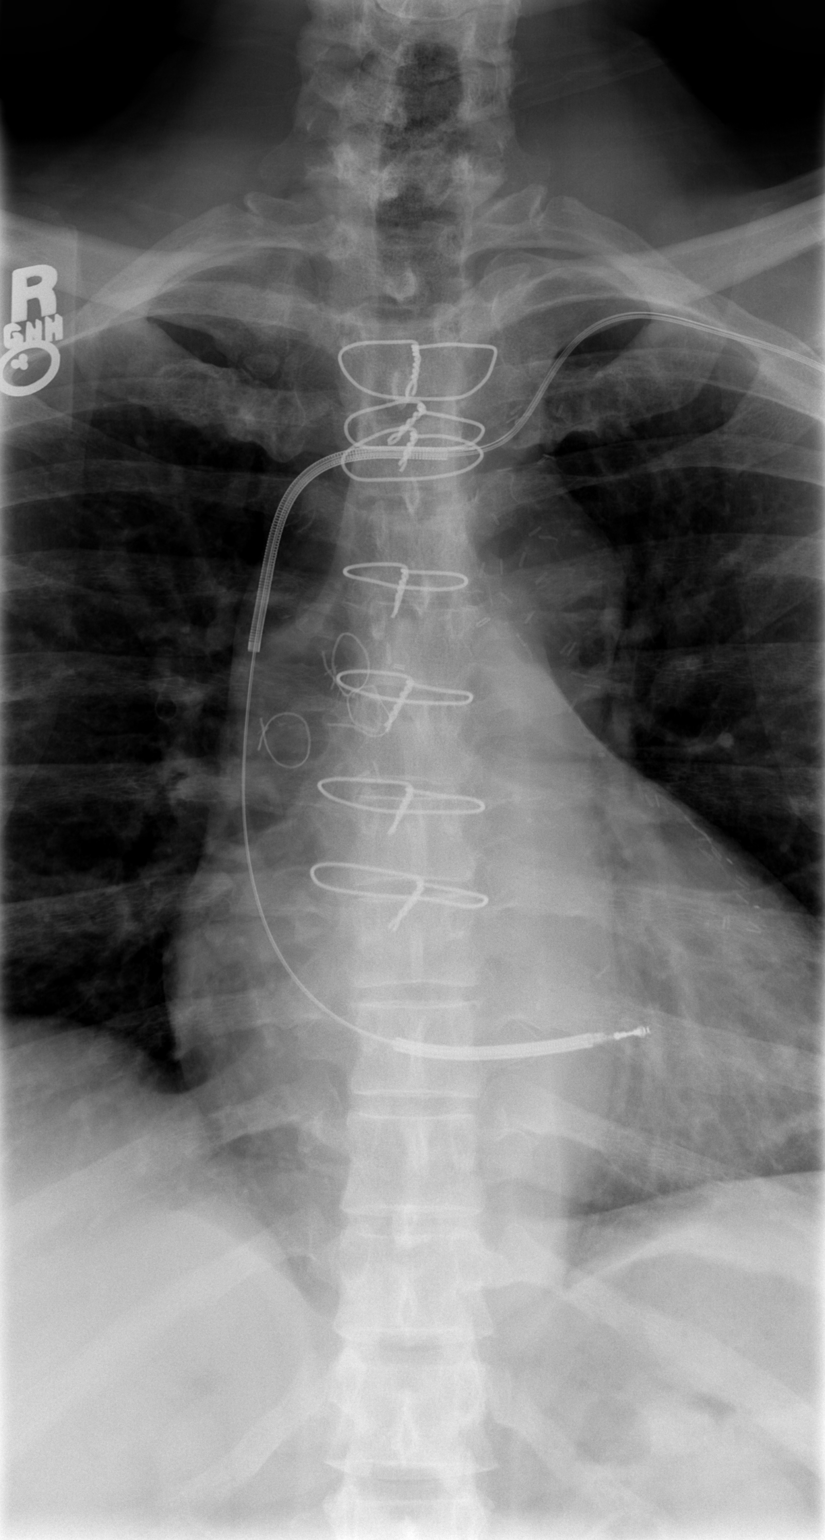

[t t-spine lat]
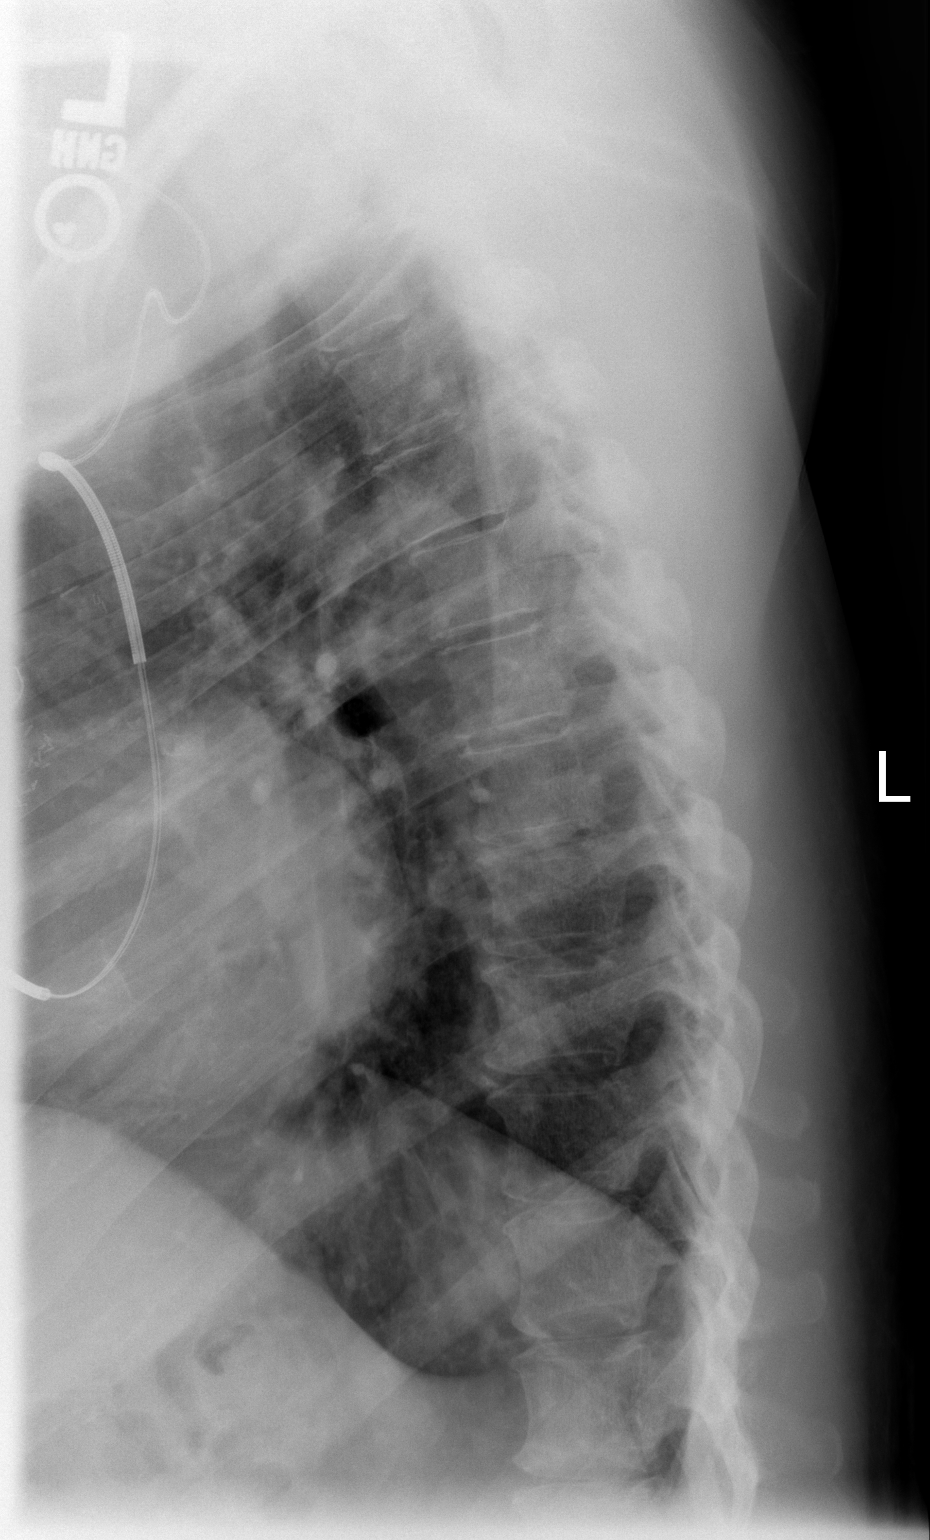

[t swimmers]
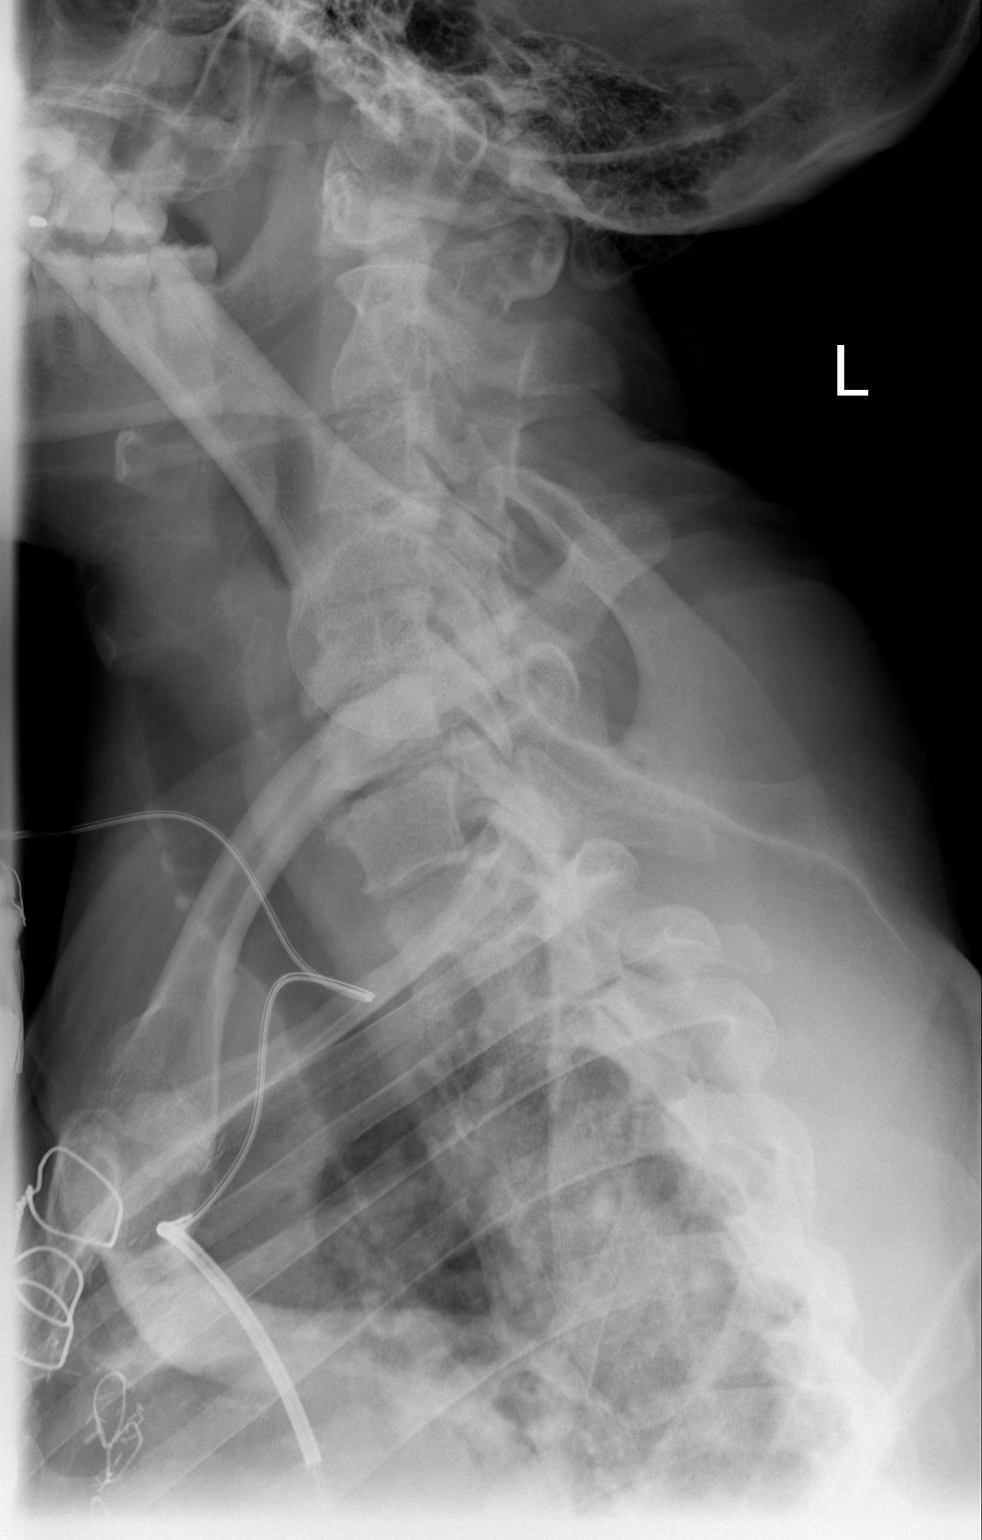

[3 of 3 positions shown; findings below may reference images not displayed]

FINDINGS: Anatomic alignment.  No overt to body height loss. Left
subclavian AICD device.
IMPRESSION: No acute bony pathology.

## 2009-07-24 IMAGING — CR DG LUMBAR SPINE COMPLETE 4+V
5 series · 5 of 5 positions shown · non-contrast
Comparison: None

CLINICAL DATA: Pain

LUMBAR SPINE - COMPLETE 4+ VIEW

[t l-spine a.p.]
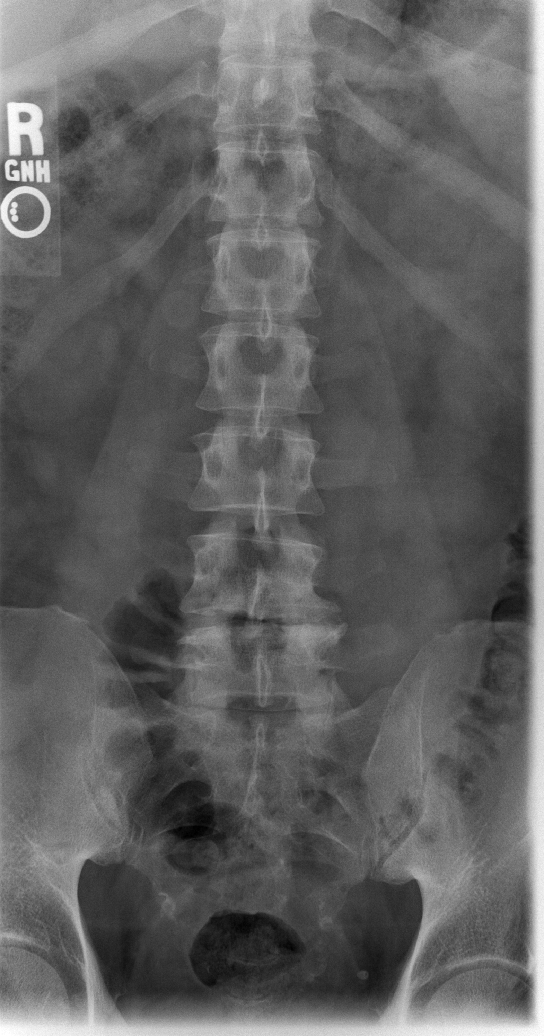

[t l-spine oblique exposure (1 of 2)]
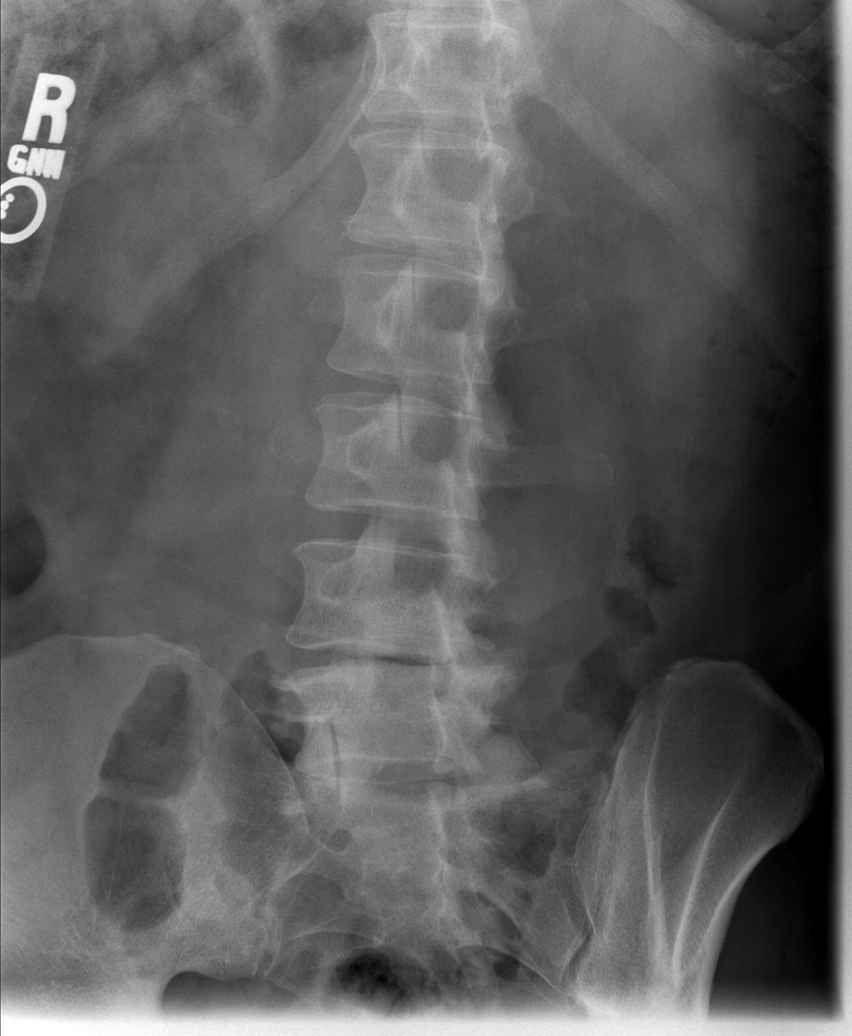

[t l-spine oblique exposure (2 of 2)]
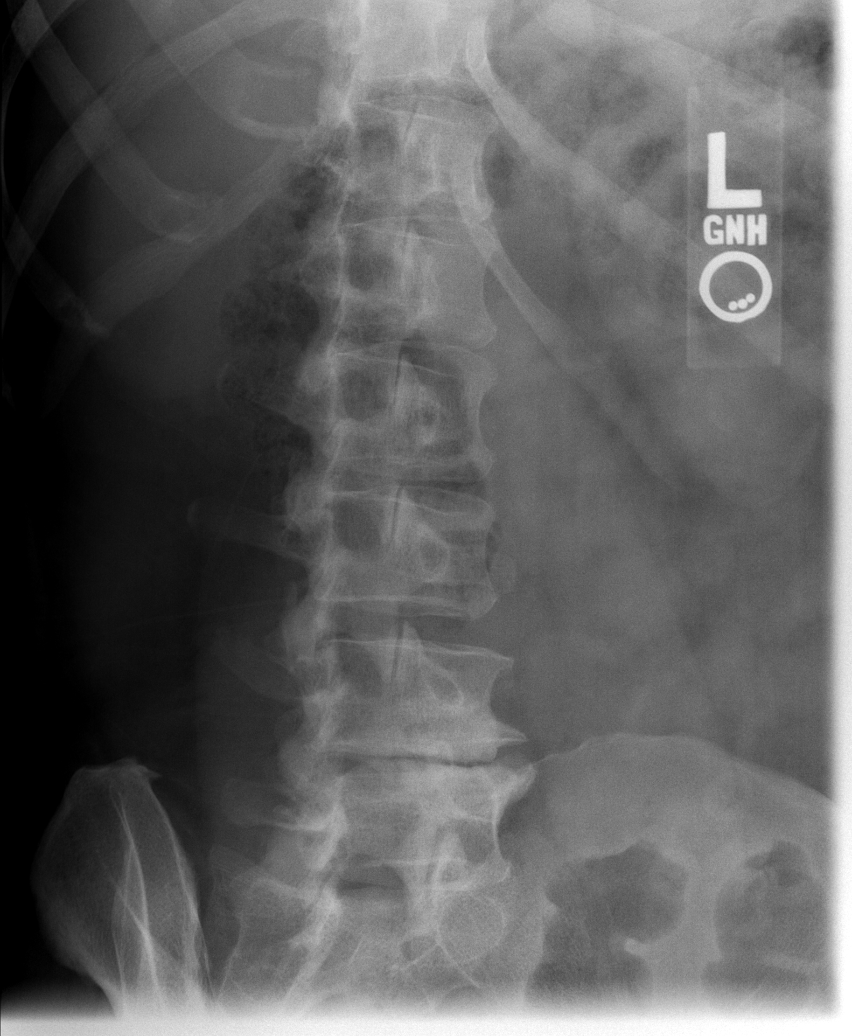

[t l-spine lat]
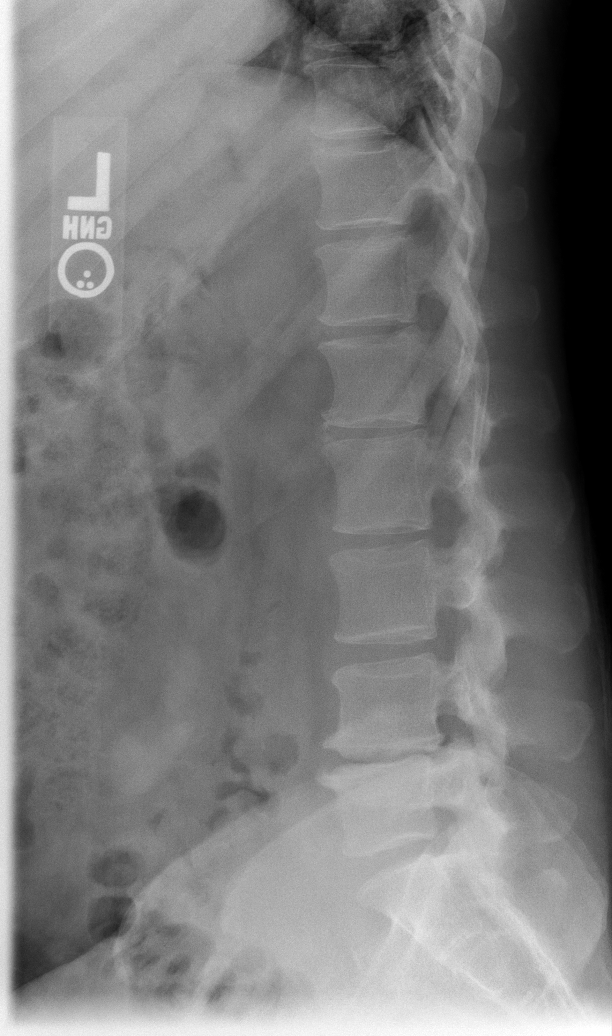

[t l-spine l5-s1 spot]
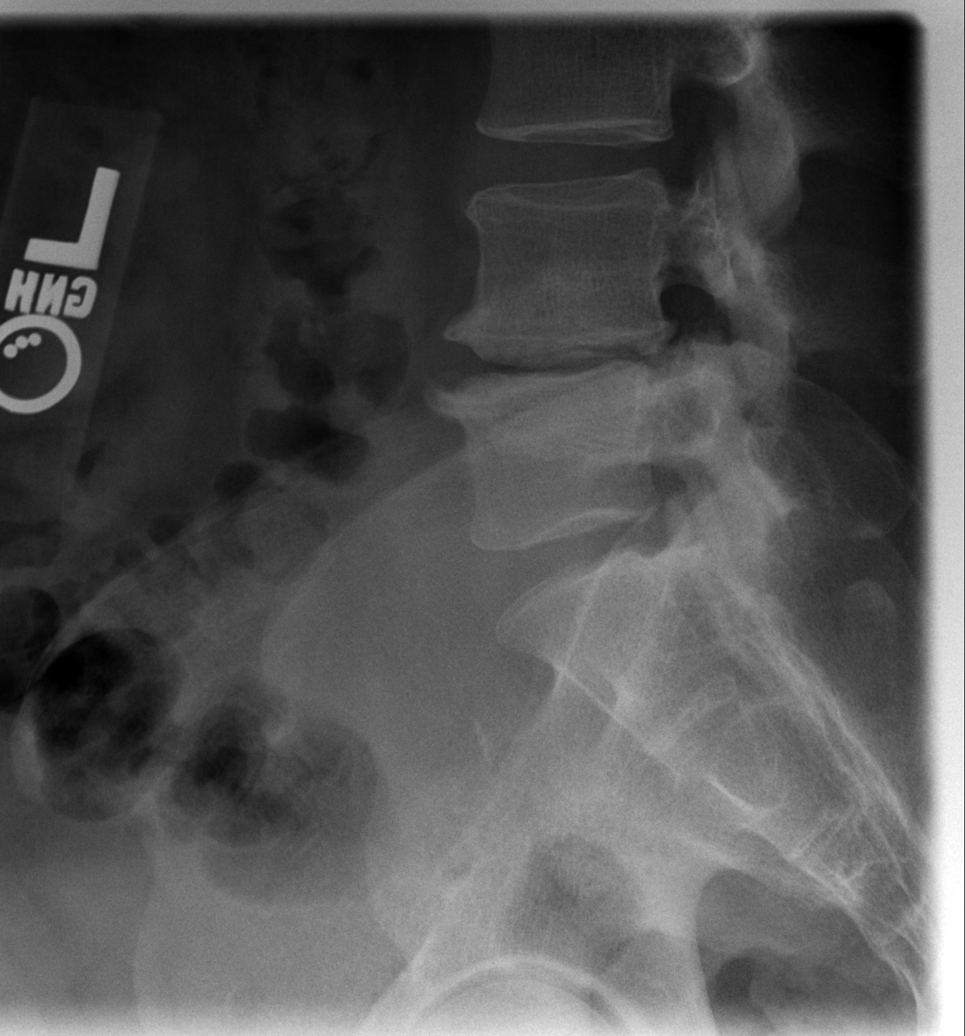

[5 of 5 positions shown; findings below may reference images not displayed]

FINDINGS: There is no vertebral body height loss.  Straightening of
the lumbar spine is noted.  Severe narrowing of the L4-5 disc with
endplate sclerosis is present compatible with severe degenerative
change.  No definite acute fracture. Round density to the right of
the L1-2 disc is likely due to an external object such as a button.
IMPRESSION: No acute bony pathology.  Degenerative change at L4-5.

## 2009-08-15 ENCOUNTER — Telehealth: Payer: Self-pay | Admitting: Nurse Practitioner

## 2009-08-31 ENCOUNTER — Ambulatory Visit: Payer: Self-pay | Admitting: Cardiology

## 2009-08-31 ENCOUNTER — Telehealth: Payer: Self-pay | Admitting: Internal Medicine

## 2009-08-31 ENCOUNTER — Encounter: Payer: Self-pay | Admitting: Internal Medicine

## 2009-08-31 ENCOUNTER — Observation Stay (HOSPITAL_COMMUNITY): Admission: EM | Admit: 2009-08-31 | Discharge: 2009-09-01 | Payer: Self-pay | Admitting: Emergency Medicine

## 2009-09-01 ENCOUNTER — Encounter: Payer: Self-pay | Admitting: Cardiology

## 2009-09-13 ENCOUNTER — Telehealth: Payer: Self-pay | Admitting: Internal Medicine

## 2009-09-14 IMAGING — CR DG LUMBAR SPINE COMPLETE 4+V
5 series · 5 of 5 positions shown · non-contrast
Comparison: 03/06/2009

CLINICAL DATA: Pain.  Burning in the buttocks and legs.

LUMBAR SPINE - COMPLETE 4+ VIEW

[t l-spine a.p.]
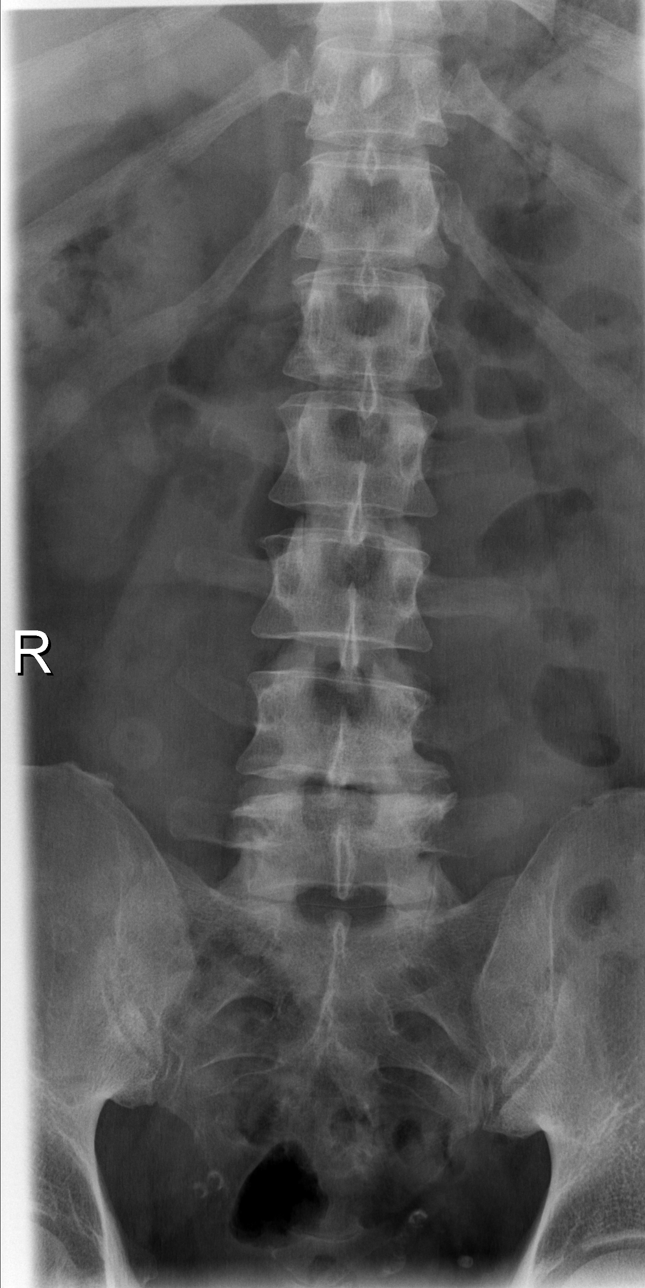

[t l-spine oblique exposure (1 of 2)]
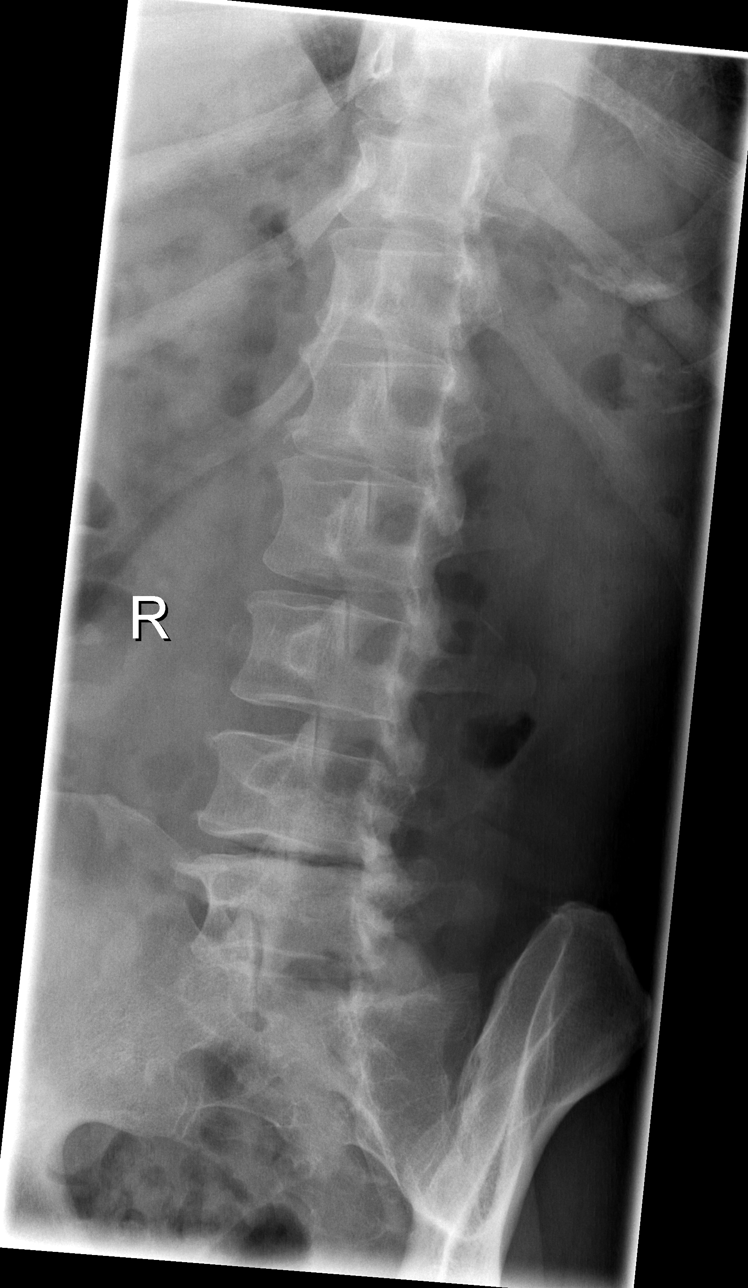

[t l-spine oblique exposure (2 of 2)]
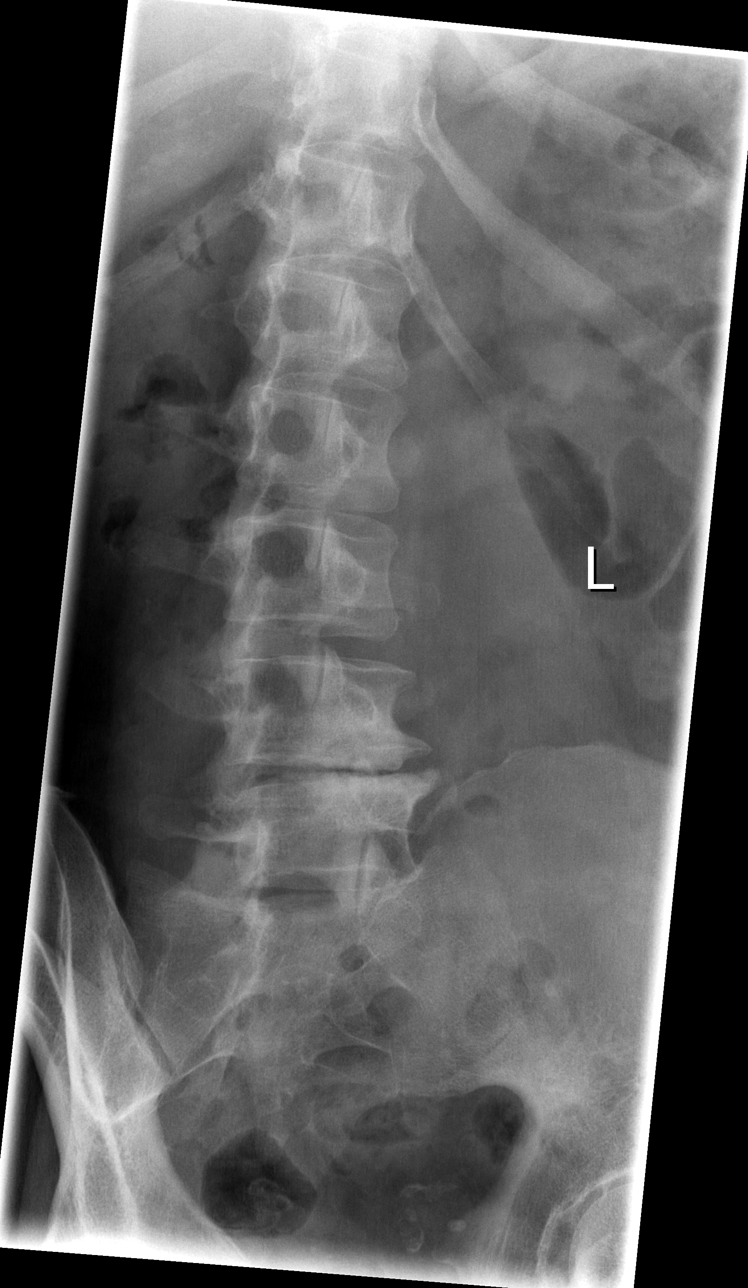

[t l-spine lat]
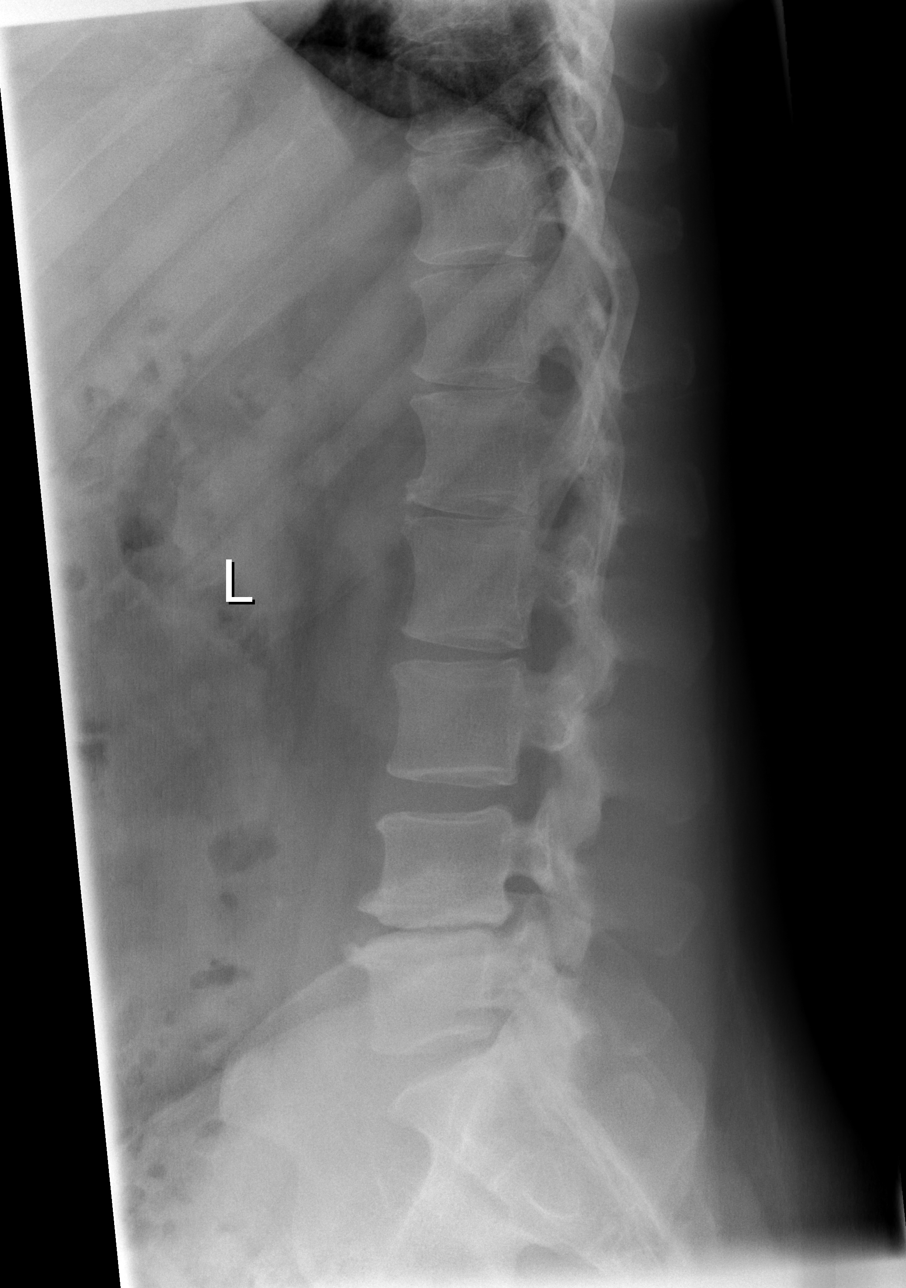

[t l-spine l5-s1 spot]
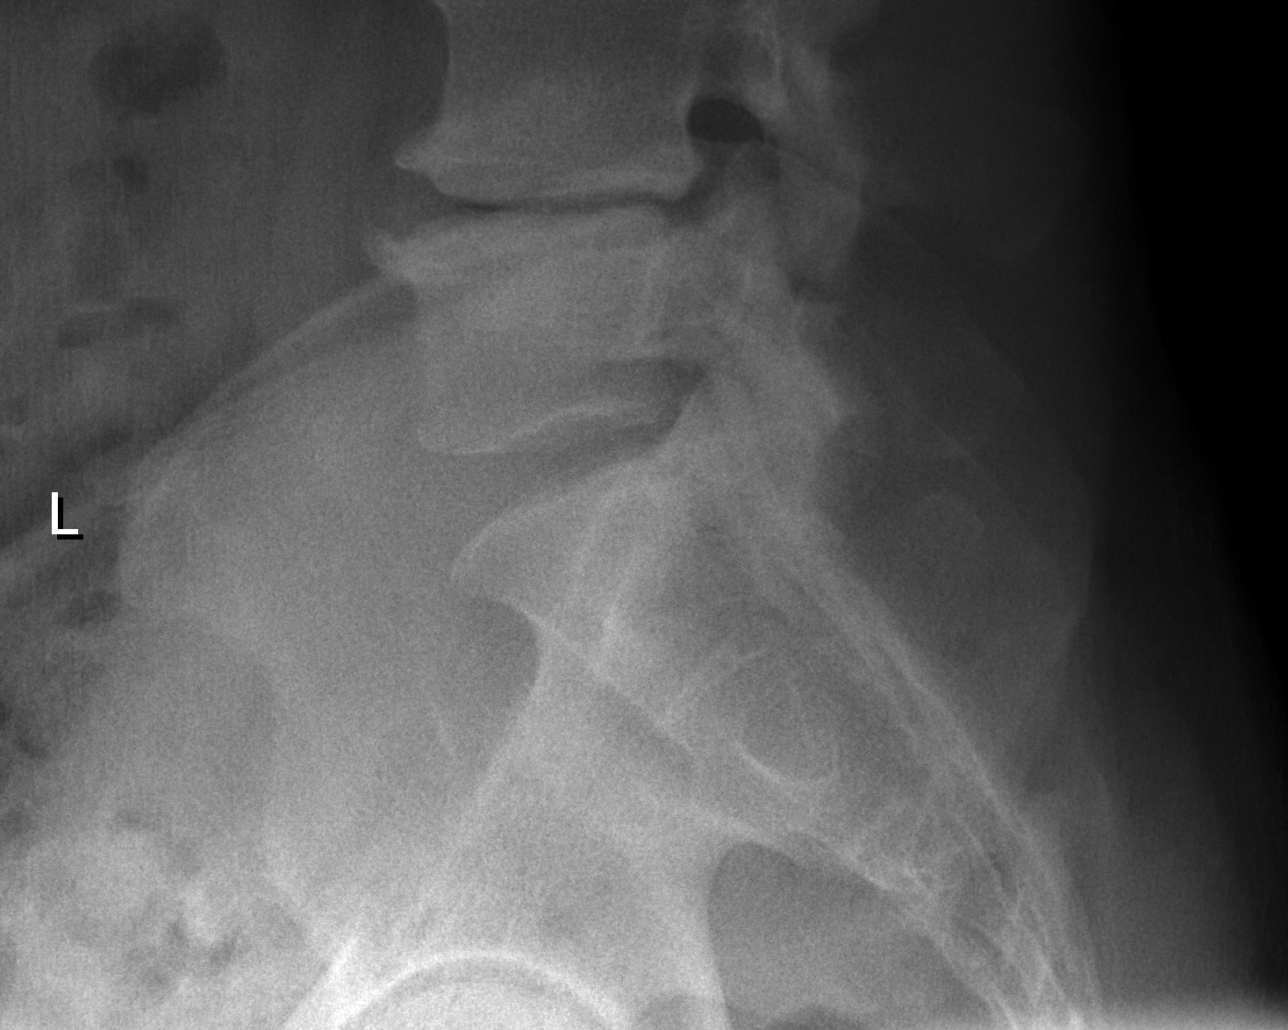

[5 of 5 positions shown; findings below may reference images not displayed]

FINDINGS: No change since 2 months ago.  There is chronic
degenerative disc disease at L4-5 with disc space narrowing,
osteophytes and endplate sclerosis.  There is facet degeneration at
L4-5 and L5-S1.
IMPRESSION: Lower lumbar degenerative changes, not appreciably changed since 2
months ago.

## 2009-09-14 IMAGING — CR DG CHEST 2V
2 series · 2 of 2 positions shown · non-contrast
Comparison: 06/30/2008

CLINICAL DATA: Back pain.  Weakness.

CHEST - 2 VIEW

[w chest pa]
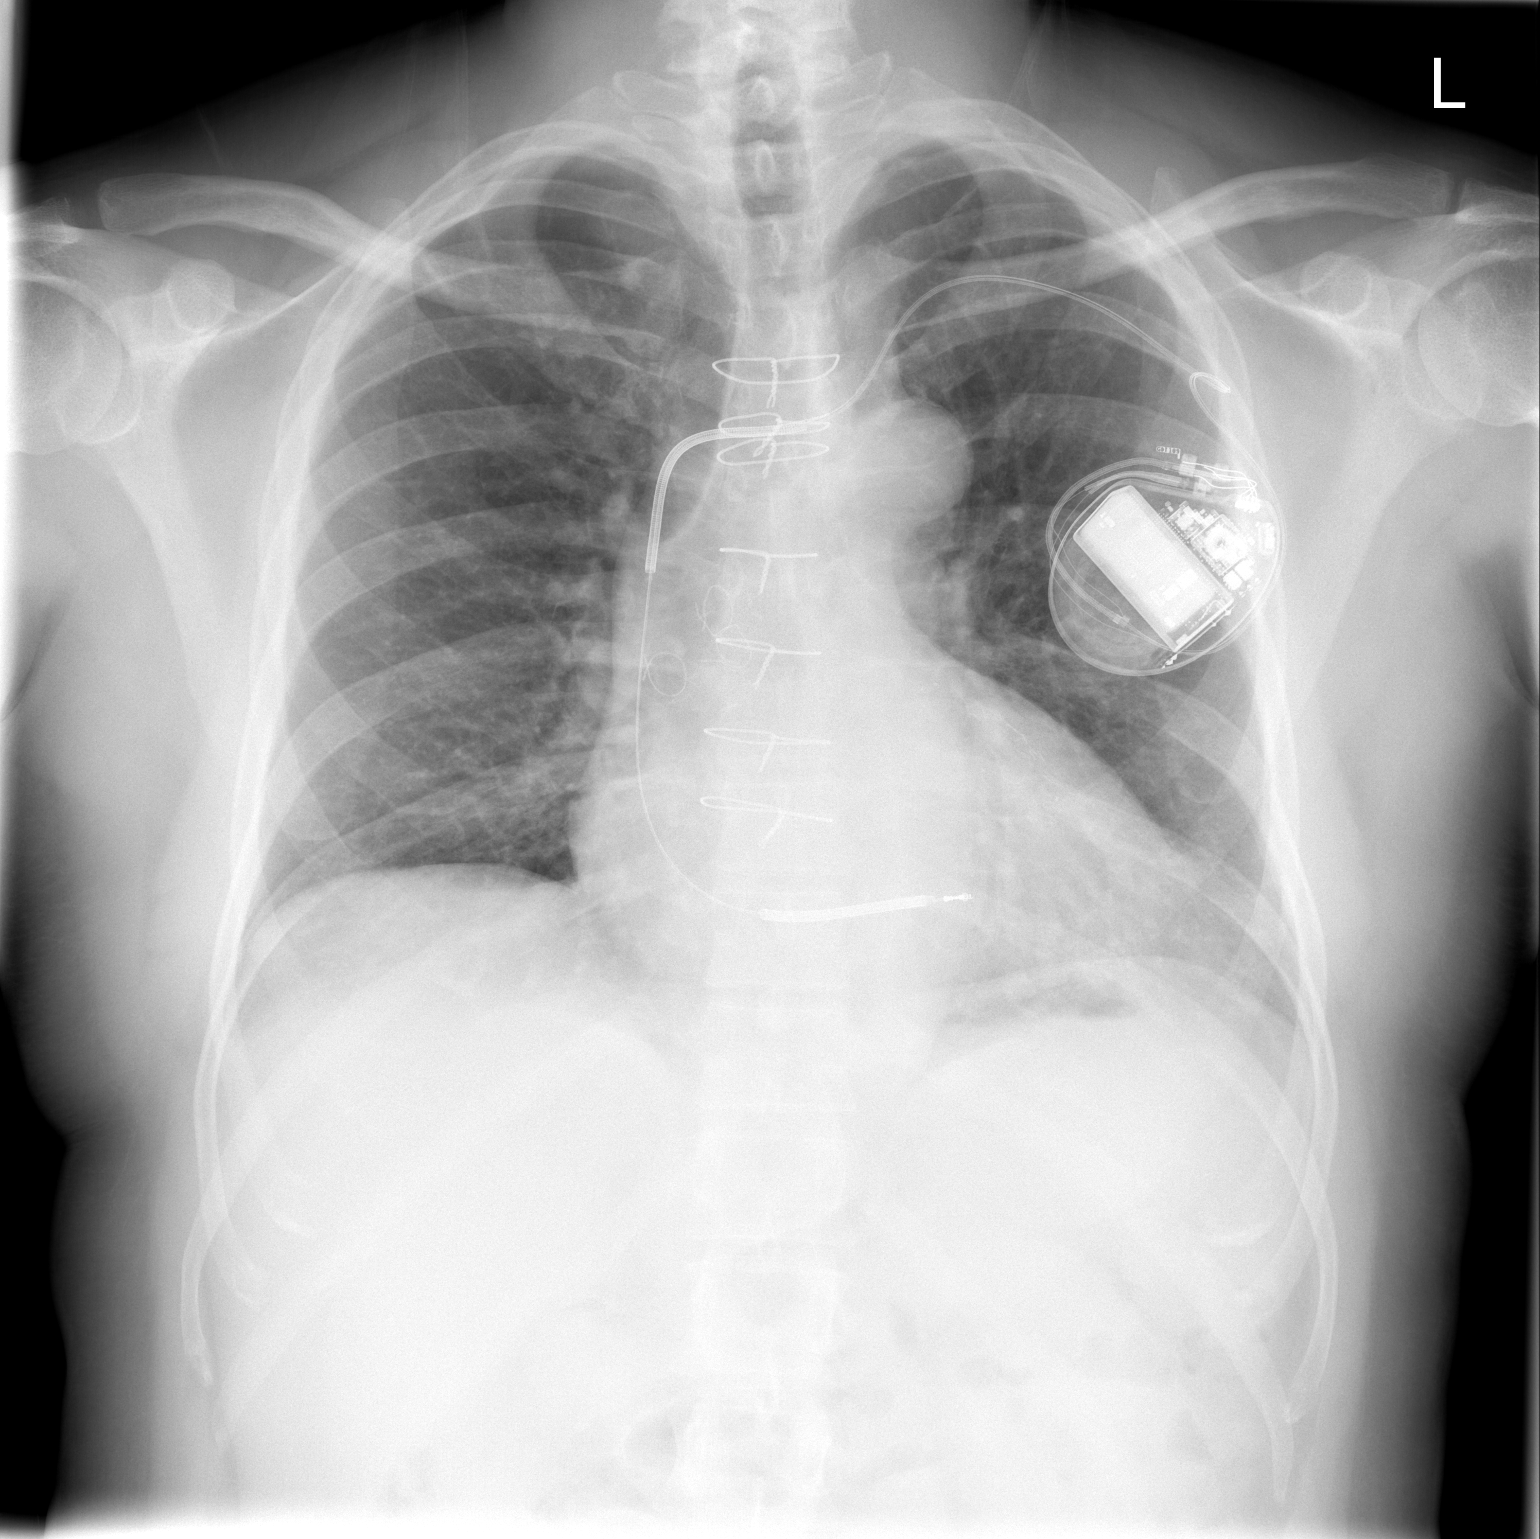

[w chest lat]
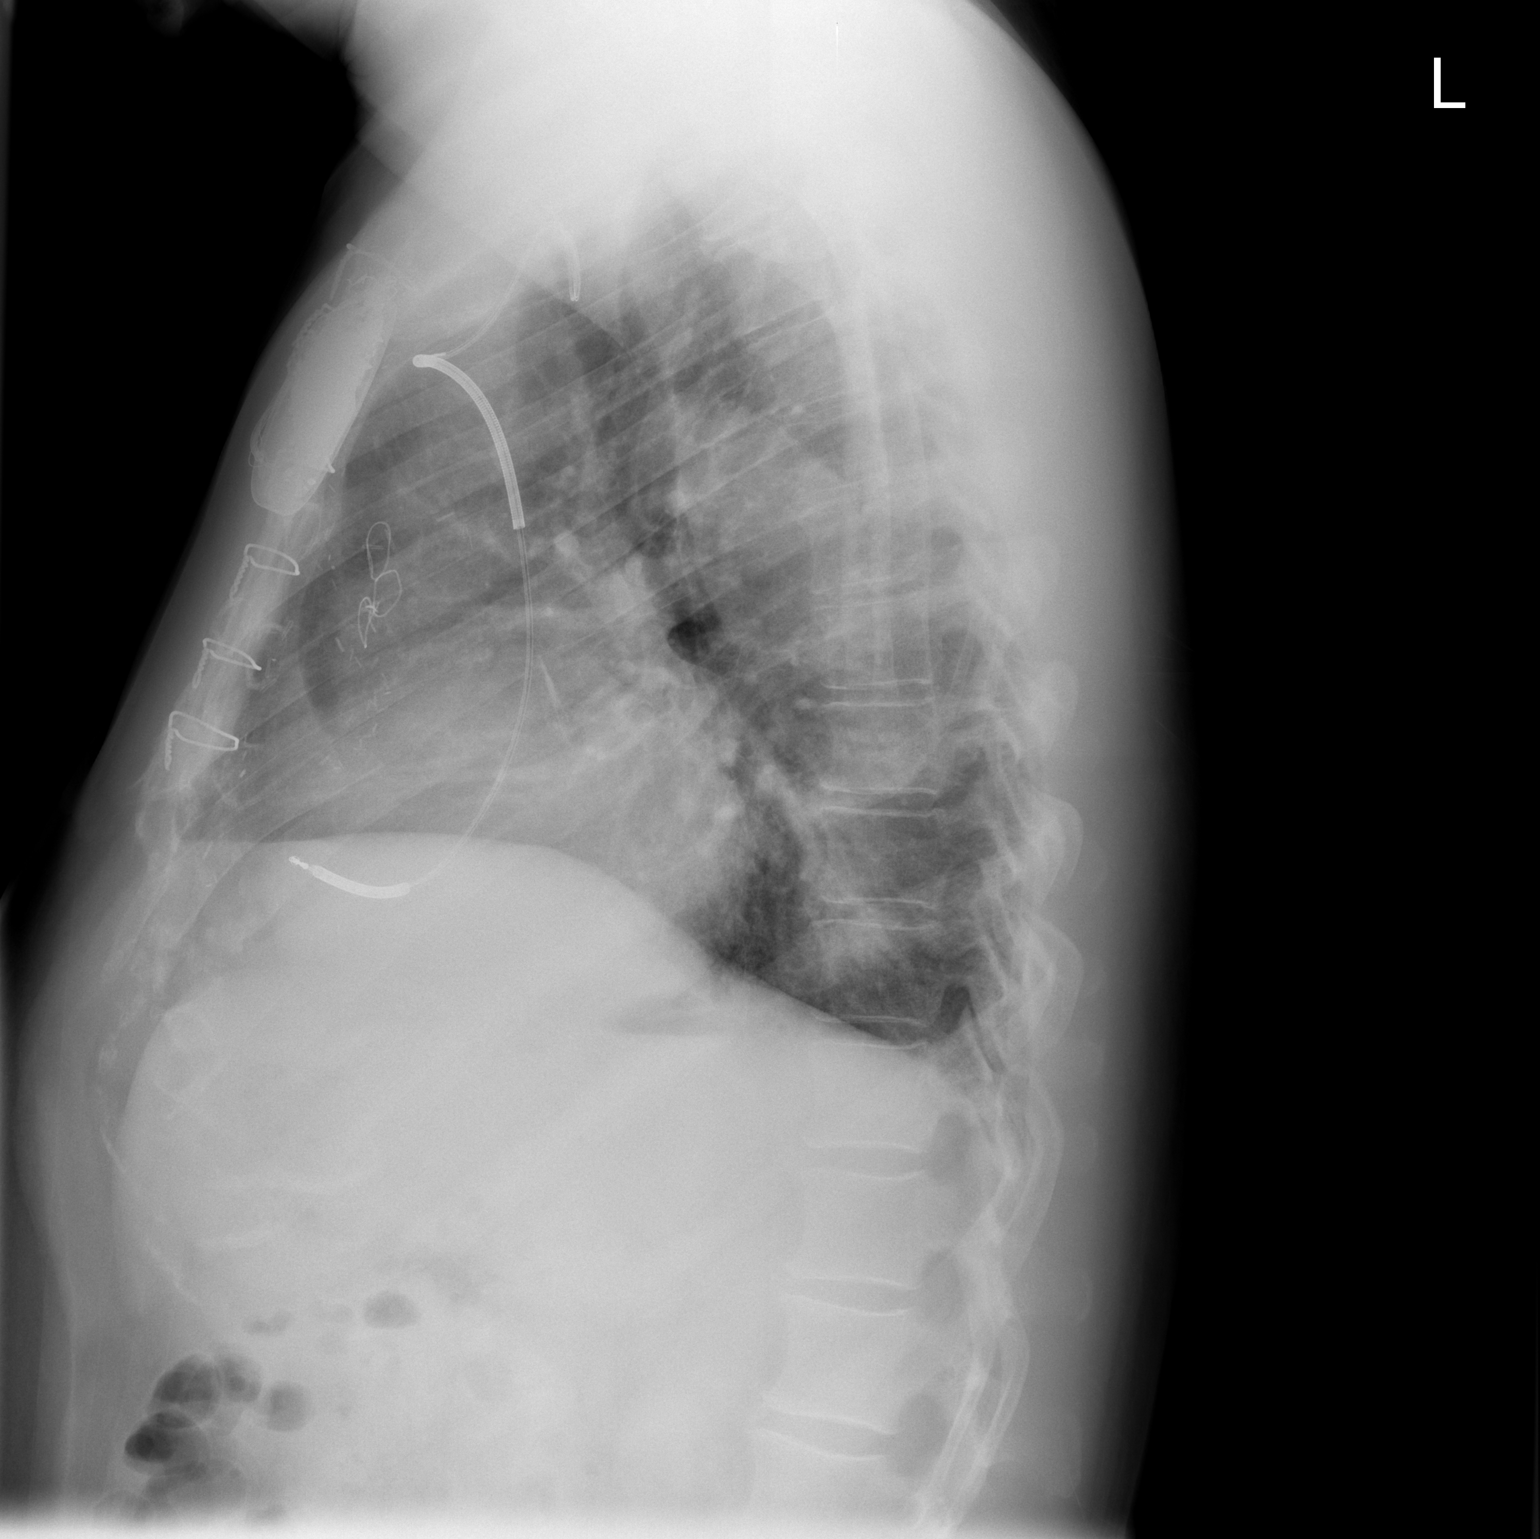

[2 of 2 positions shown; findings below may reference images not displayed]

FINDINGS: There is been previous median sternotomy and CABG.  AICD
remains in place.  The heart is mildly enlarged.  The aorta is
unfolded.  There may be venous hypertension but there is no edema,
infiltrate or effusion.
IMPRESSION: Mild cardiac enlargement.  Probable venous hypertension.  No frank
edema.

## 2009-09-15 ENCOUNTER — Ambulatory Visit: Payer: Self-pay | Admitting: Internal Medicine

## 2009-09-20 IMAGING — CT CT L SPINE W/O CM
5 of 7 series · 14 of 33 positions shown, 15 images · non-contrast
Comparison: Conventional radiographs of 04/27/2009

CLINICAL DATA: Low back pain radiating to the lower extremities.

CT LUMBAR SPINE WITHOUT CONTRAST
TECHNIQUE: Multidetector CT imaging of the lumbar spine was
performed without intravenous contrast administration. Multiplanar
CT image reconstructions were also generated.

[Series 2: l-spine · axial · 0.30mm/px · z∈[-268,-118]mm · 3 of 120 slices shown, 4 images (1 of 2)]
[im 30/120  soft-tissue]
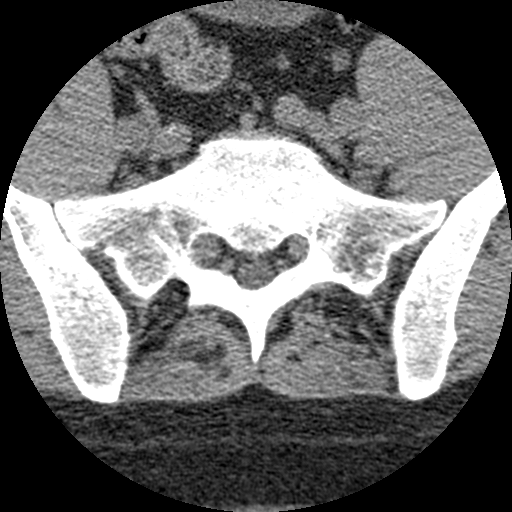
[im 30/120  bone]
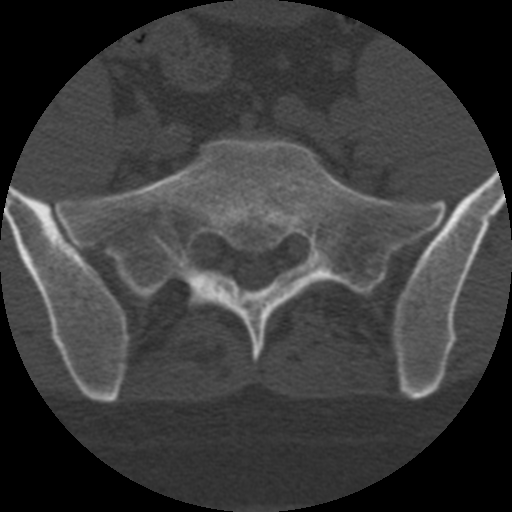
[im 60/120  bone]
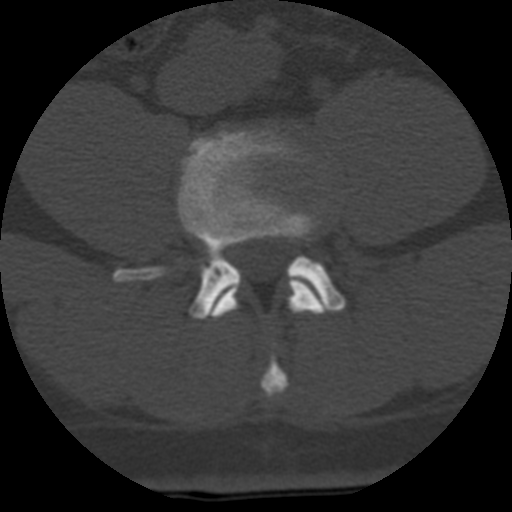
[im 90/120  bone]
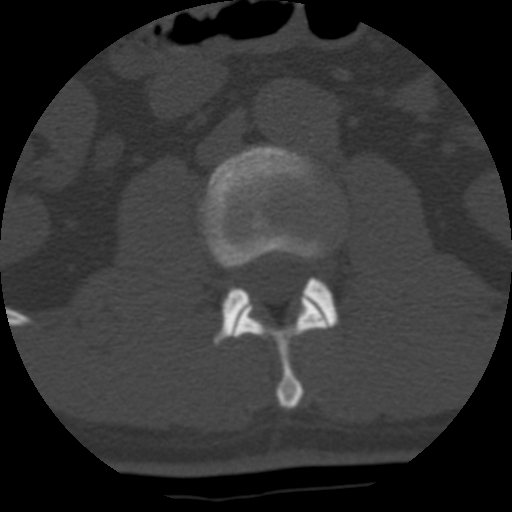

[Series 3: recon 2: l-spine · axial · 0.27mm/px · z∈[-228,-145]mm · 2 of 100 slices shown]
[im 34/100  bone]
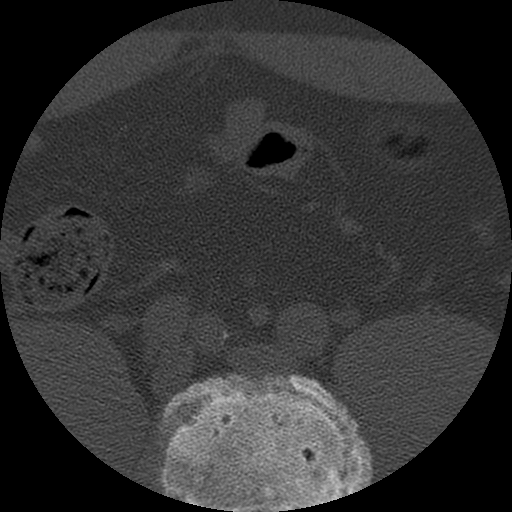
[im 67/100  bone]
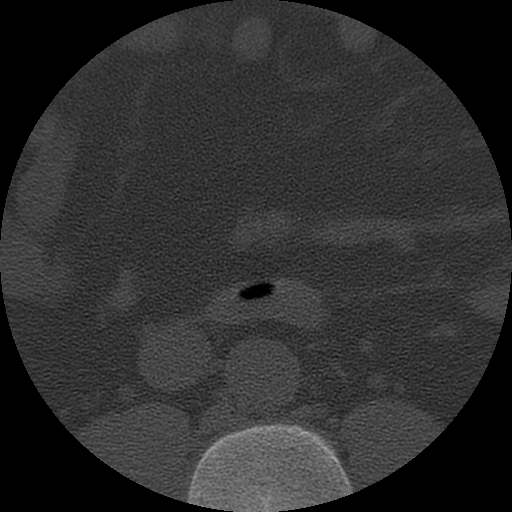

[Series 102: l-spine · axial · 0.30mm/px · z∈[-268,-118]mm · 3 of 120 slices shown (2 of 2)]
[im 30/120  bone]
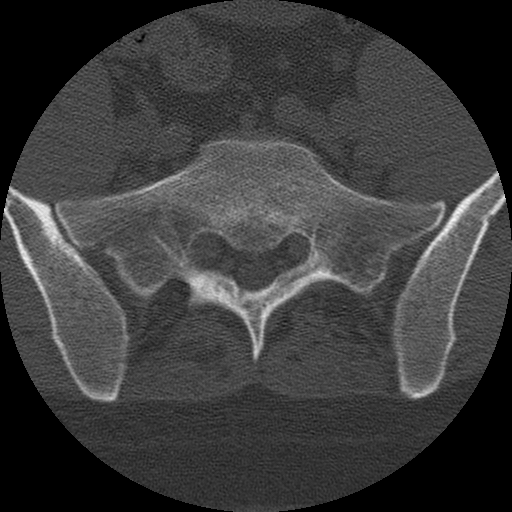
[im 60/120  bone]
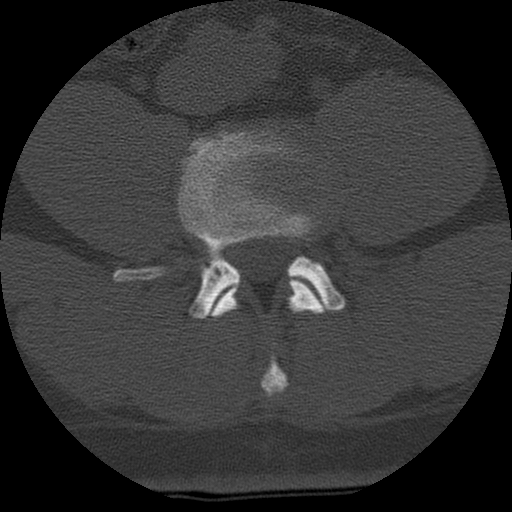
[im 90/120  bone]
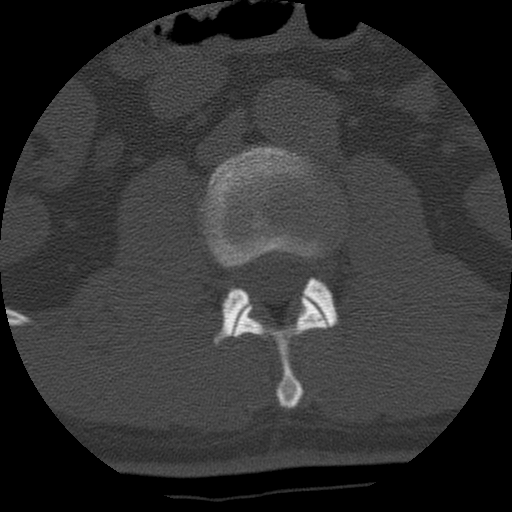

[Series 103: sagittal bone · sagittal · 0.60mm/px · 5 of 40 slices shown]
[im 7/40  bone]
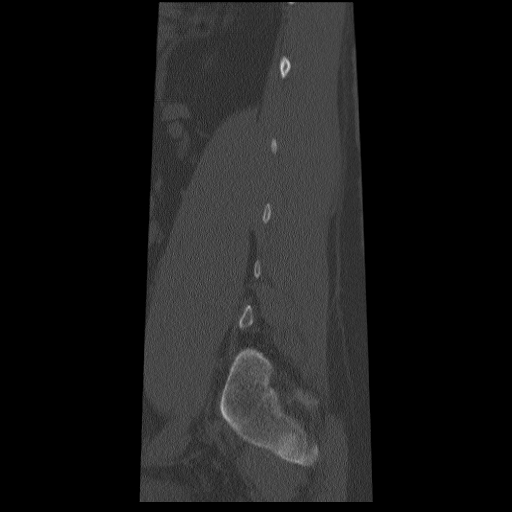
[im 14/40  bone]
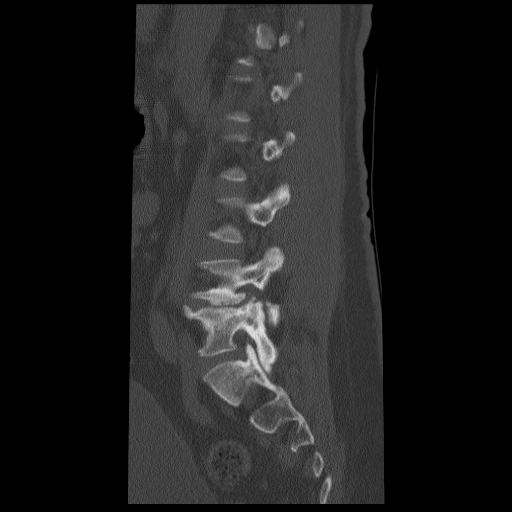
[im 20/40  bone]
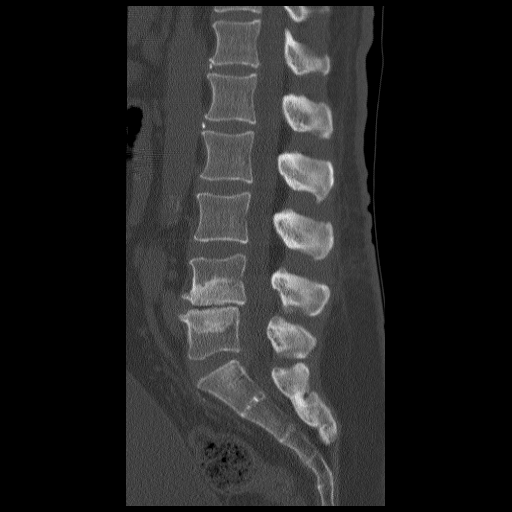
[im 27/40  bone]
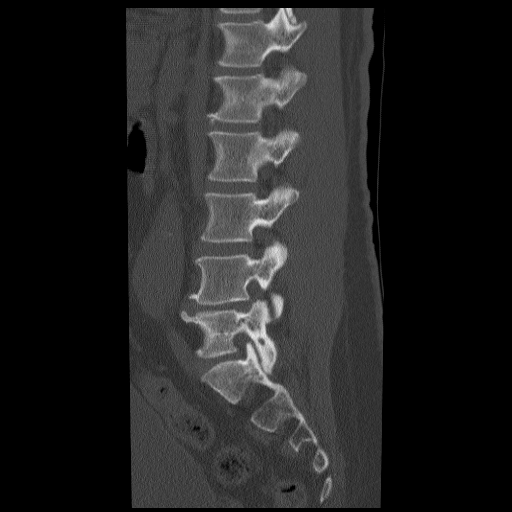
[im 33/40  bone]
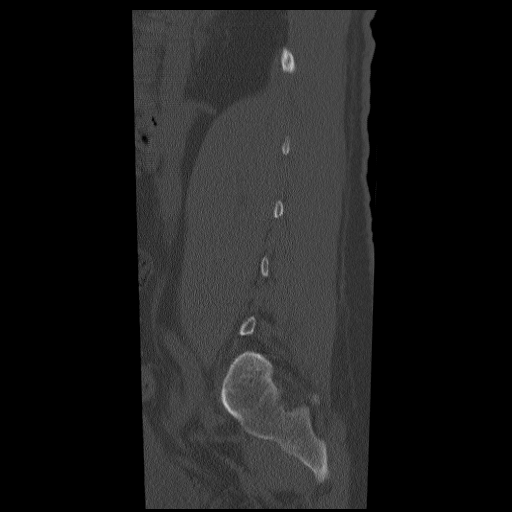

[Series 104: coronal bone · coronal · 0.60mm/px · 1 of 36 slices shown]
[im 18/36  bone]
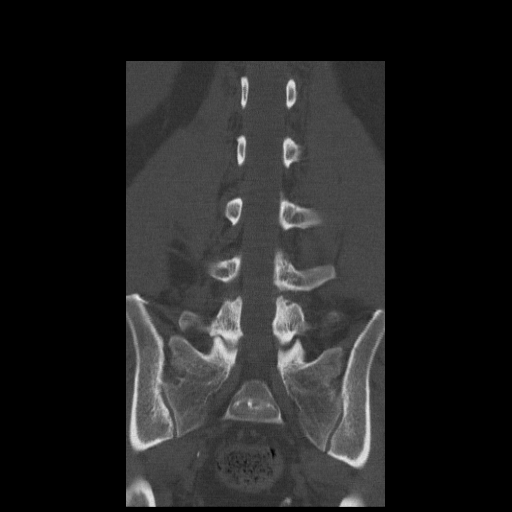

[14 of 33 positions shown; findings below may reference images not displayed]

FINDINGS: Today's noncontrast CT does not provide the level of
detail or specificity of a myelogram or MRI with regard to nerve
root impingement.

The lowest full intervertebral disk space is labeled L5-S1.  If
procedural intervention is to be performed, careful correlation
with this numbering strategy is recommended.

The conus medullaris is not well seen.

There is 4 mm of degenerative posterior subluxation of L4 on L5
associated with degenerative spurring, loss of intervertebral disc
height, and degenerative endplate sclerosis.  Lumbar vertebral
alignment appears otherwise normal.

No vertebral fracture is identified in the lumbar spine.
Additional findings at individual levels are as follows:

L1-2:  Unremarkable.

L2-3:  Mild disc bulge without stenosis.

L3-4:  Mild disc bulge. The AP diameter of the thecal sac is 10 mm,
borderline for central stenosis.

L4-5:  Posterior spurring and chronic disc bulge noted along with
facet and intervertebral spurring extending in the neural foramina.
There appears to be mild right and mild-moderate left foraminal
stenosis.  I am suspicious for the possibility of subarticular
lateral recess stenosis related to the disc bulge and facet
arthropathy.

L5-S1:  Suspected disc protrusion is noted centrally at this level,
potentially with mild central stenosis.  There is borderline
bilateral foraminal stenosis and potentially mild bilateral
subarticular lateral recess stenosis.

The urinary bladder is only partially imaged but appears distended.
IMPRESSION: 1.  Spondylosis and degenerative disc disease as noted above, with
dominant findings at the L4-5 and L5-S1 levels.

2.  Distended urinary bladder, probably simply incidental.

## 2009-09-22 IMAGING — CT CT HEAD W/O CM
1 series · 16 of 30 positions shown, 20 images · non-contrast
Comparison: 08/10/2007.

CLINICAL DATA: Low back pain.  Dizziness.

CT HEAD WITHOUT CONTRAST
TECHNIQUE: Contiguous axial images were obtained from the base of
the skull through the vertex without contrast.

[Series 2: head routine 4.8 h37s · axial · 0.50mm/px · z∈[+826,+983]mm · 16 of 36 slices shown, 20 images]
[im 2/36  brain]
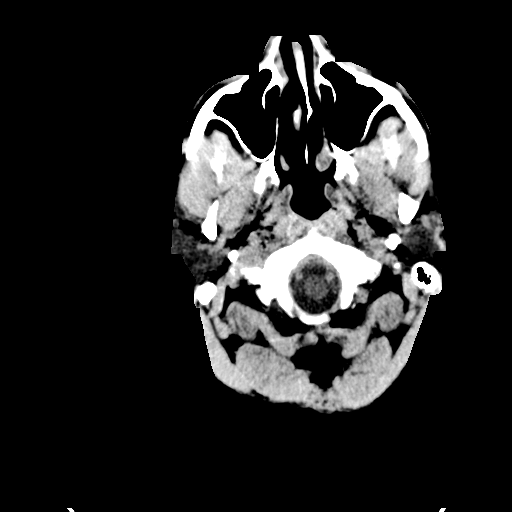
[im 2/36  bone]
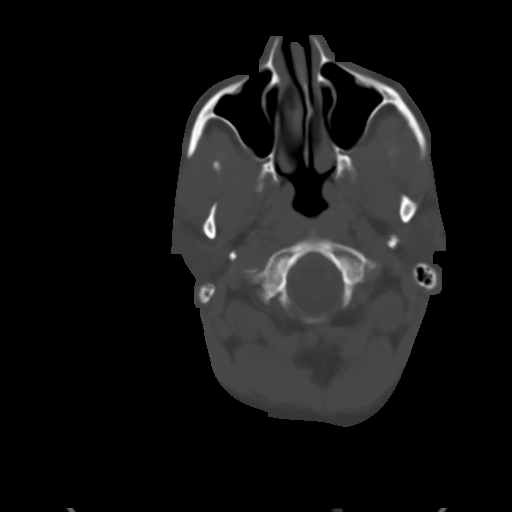
[im 4/36  brain]
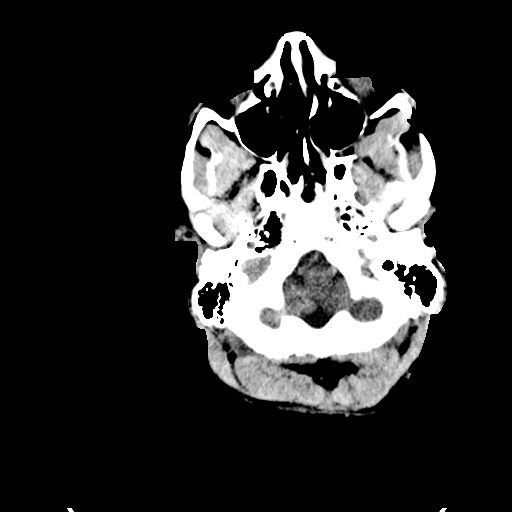
[im 7/36  brain]
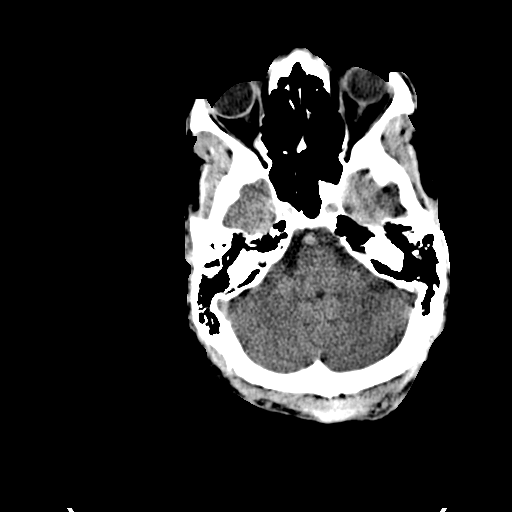
[im 9/36  brain]
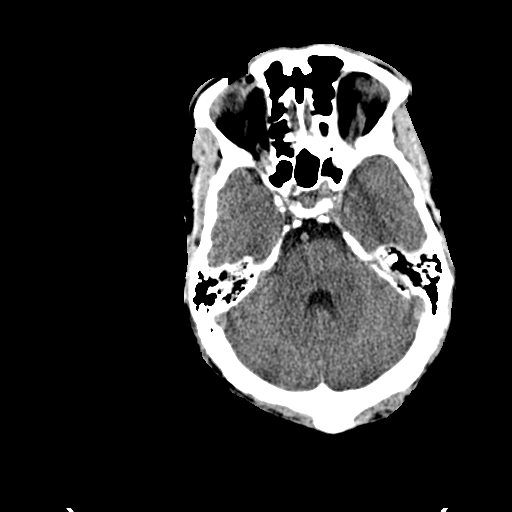
[im 10/36  brain]
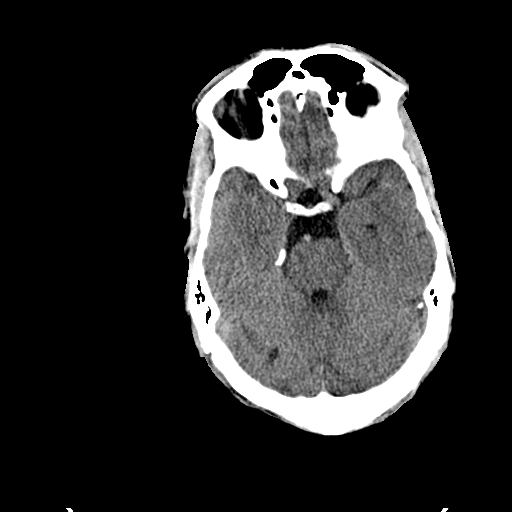
[im 10/36  bone]
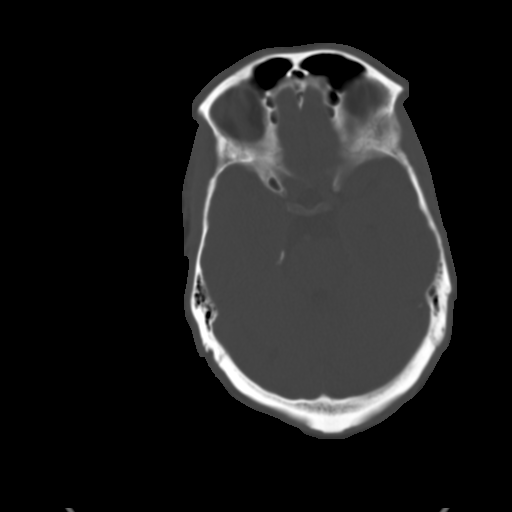
[im 13/36  brain]
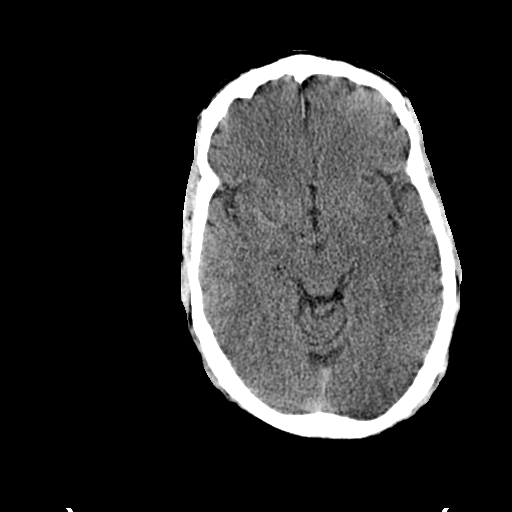
[im 15/36  brain]
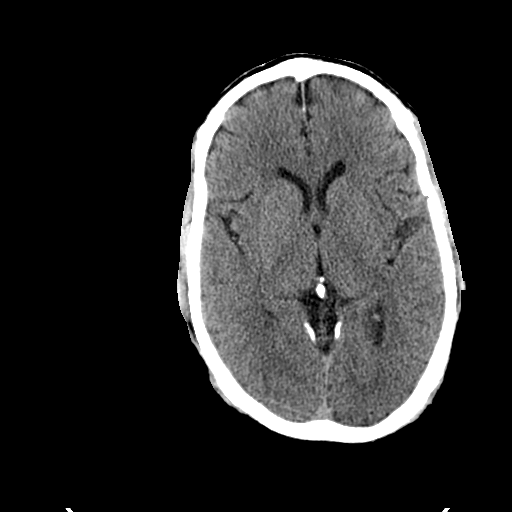
[im 17/36  brain]
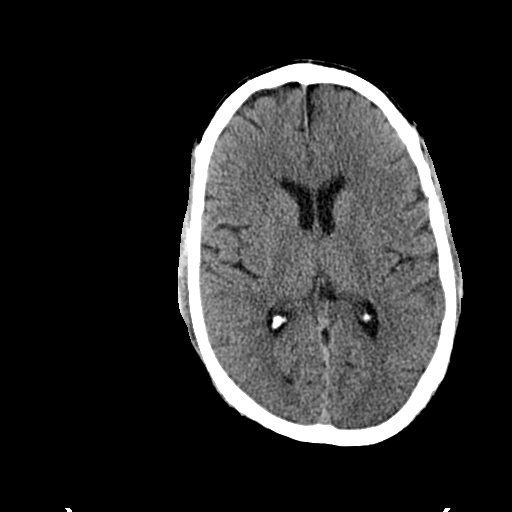
[im 19/36  brain]
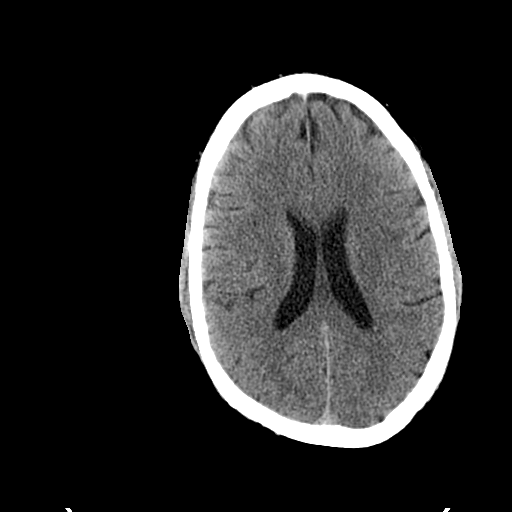
[im 19/36  bone]
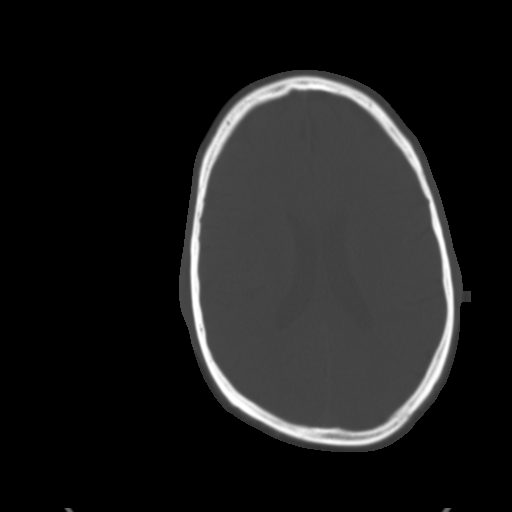
[im 21/36  brain]
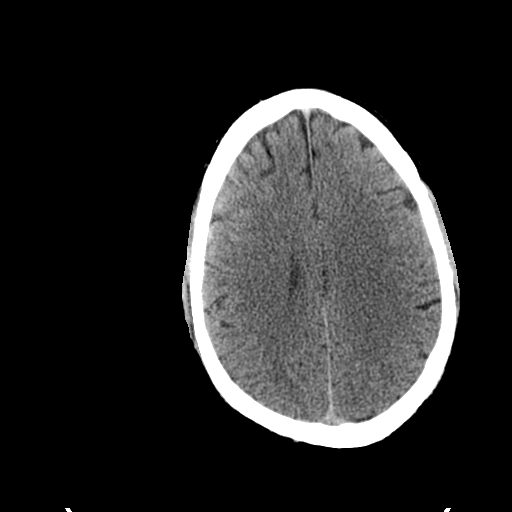
[im 23/36  brain]
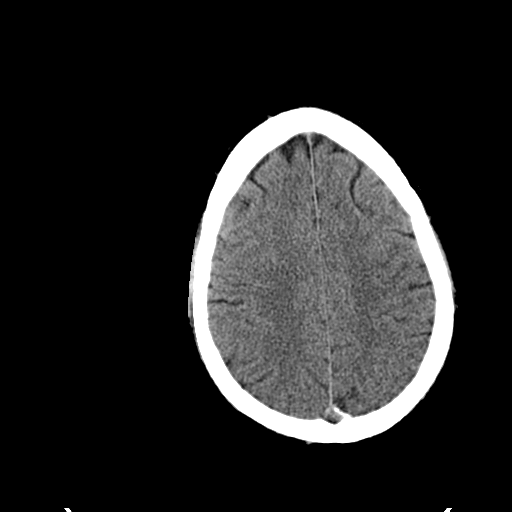
[im 26/36  brain]
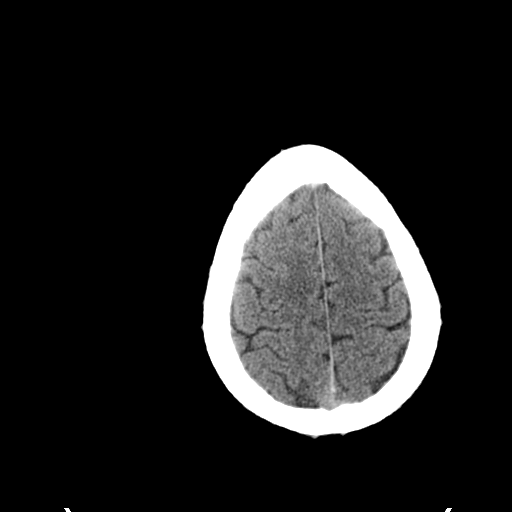
[im 27/36  brain]
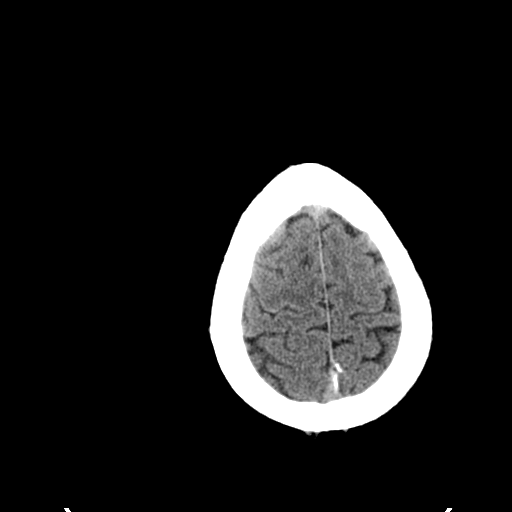
[im 27/36  bone]
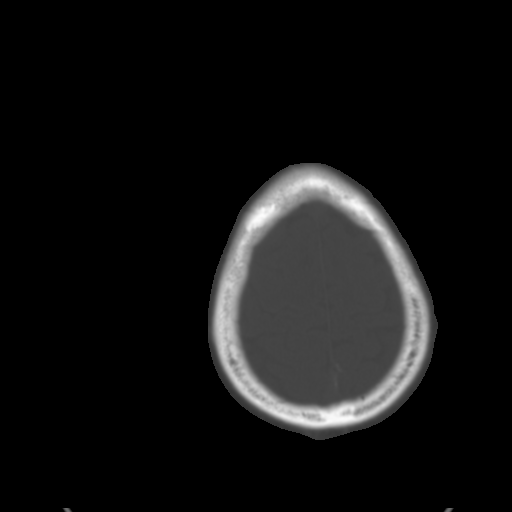
[im 29/36  brain]
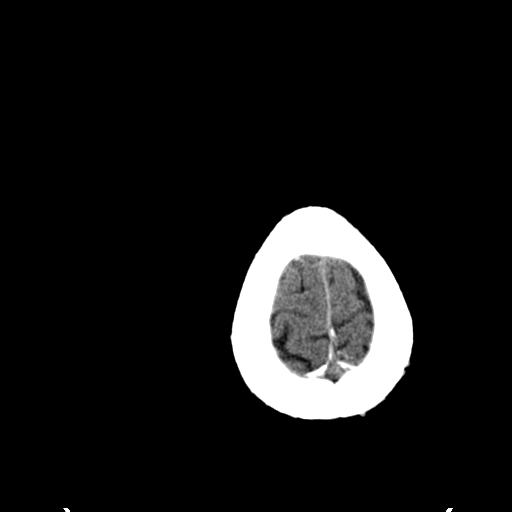
[im 32/36  brain]
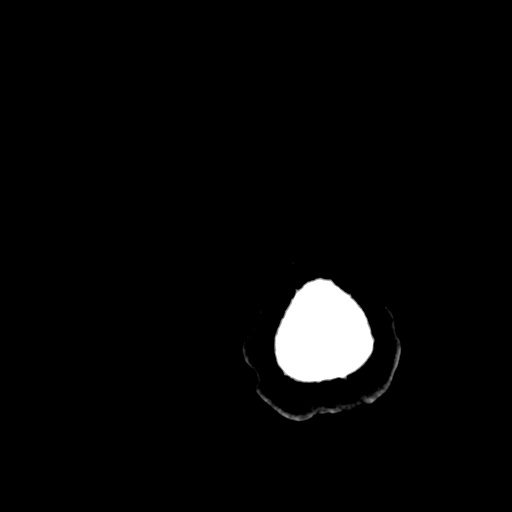
[im 34/36  brain]
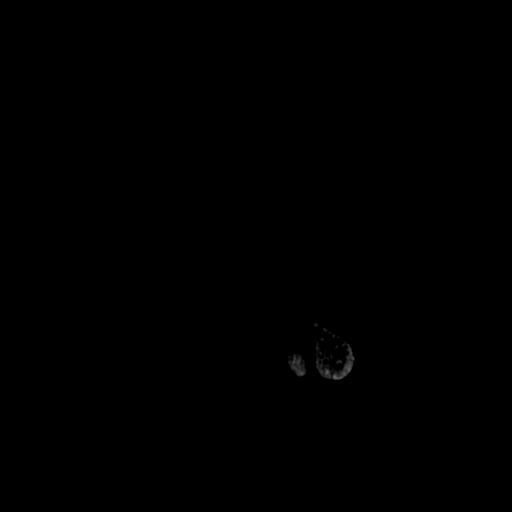

[16 of 30 positions shown; findings below may reference images not displayed]

FINDINGS: No mass lesion, mass effect, midline shift,
hydrocephalus, hemorrhage.  No territorial ischemia or acute
infarction. Gray white differentiation is preserved.  High
attenuation of the intracranial vessels is present diffusely,
suggesting dehydration in the appropriate clinical setting.  Right
carotid siphon atherosclerosis.
IMPRESSION: No acute intracranial abnormality.

## 2009-09-27 ENCOUNTER — Encounter: Admission: RE | Admit: 2009-09-27 | Discharge: 2009-09-27 | Payer: Self-pay | Admitting: Neurosurgery

## 2009-09-30 ENCOUNTER — Encounter: Admission: RE | Admit: 2009-09-30 | Discharge: 2009-09-30 | Payer: Self-pay | Admitting: Neurosurgery

## 2009-10-13 ENCOUNTER — Emergency Department (HOSPITAL_COMMUNITY): Admission: EM | Admit: 2009-10-13 | Discharge: 2009-10-13 | Payer: Self-pay | Admitting: Emergency Medicine

## 2009-10-22 ENCOUNTER — Telehealth (INDEPENDENT_AMBULATORY_CARE_PROVIDER_SITE_OTHER): Payer: Self-pay | Admitting: *Deleted

## 2009-10-25 ENCOUNTER — Ambulatory Visit: Payer: Self-pay | Admitting: Internal Medicine

## 2009-10-25 DIAGNOSIS — R5383 Other fatigue: Secondary | ICD-10-CM | POA: Insufficient documentation

## 2009-10-27 ENCOUNTER — Encounter: Admission: RE | Admit: 2009-10-27 | Discharge: 2009-11-29 | Payer: Self-pay | Admitting: Neurosurgery

## 2009-10-31 IMAGING — CR DG LUMBAR SPINE COMPLETE 4+V
5 series · 5 of 5 positions shown · non-contrast
Comparison: 05/03/2009

CLINICAL DATA: Low back pain radiating into the right buttock
region.

LUMBAR SPINE - COMPLETE 4+ VIEW

[t l-spine a.p.]
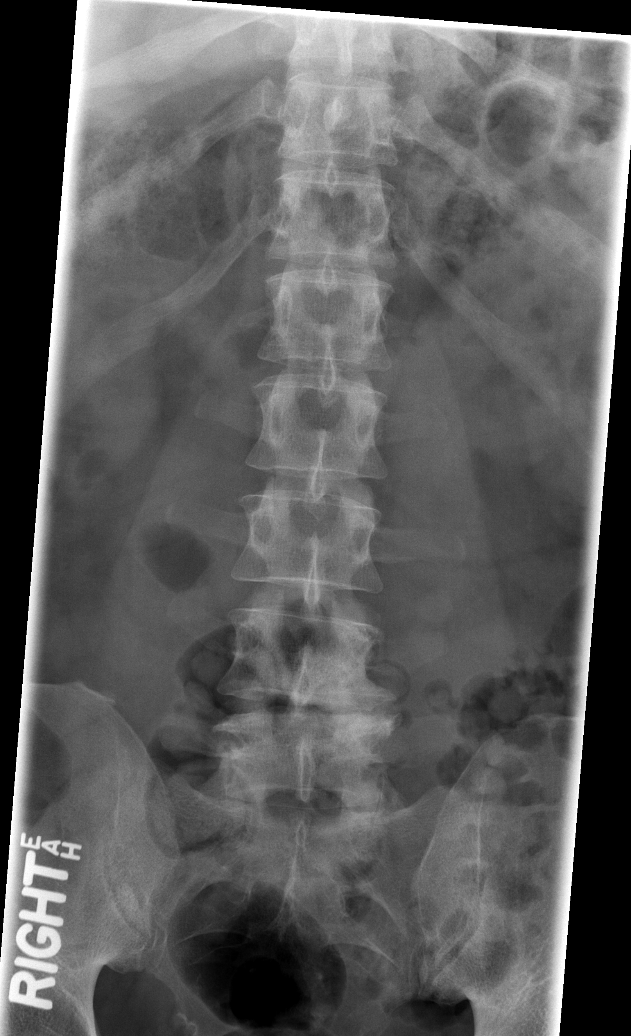

[t l-spine oblique exposure (1 of 2)]
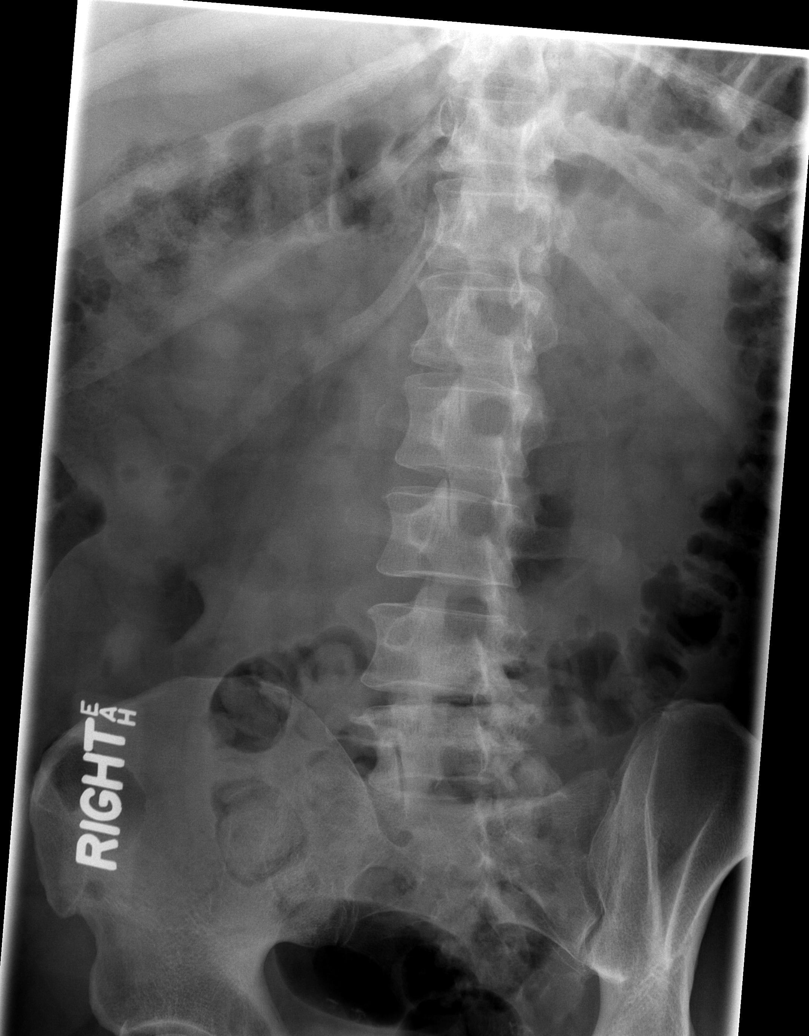

[t l-spine oblique exposure (2 of 2)]
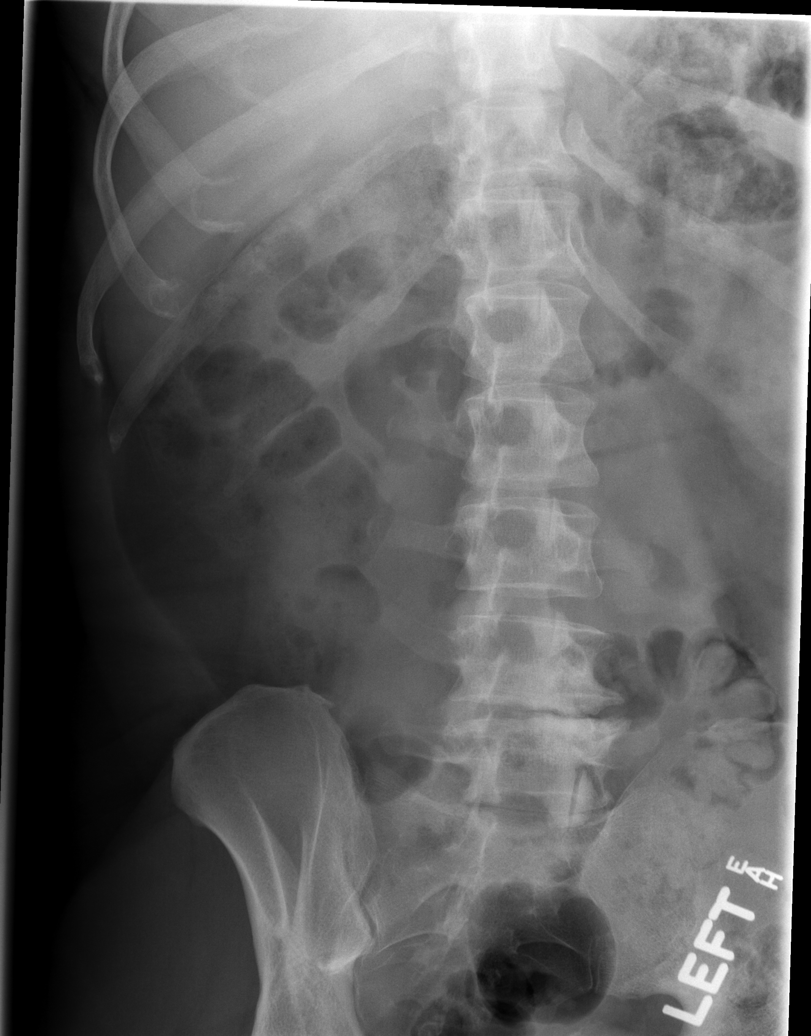

[t l-spine lat]
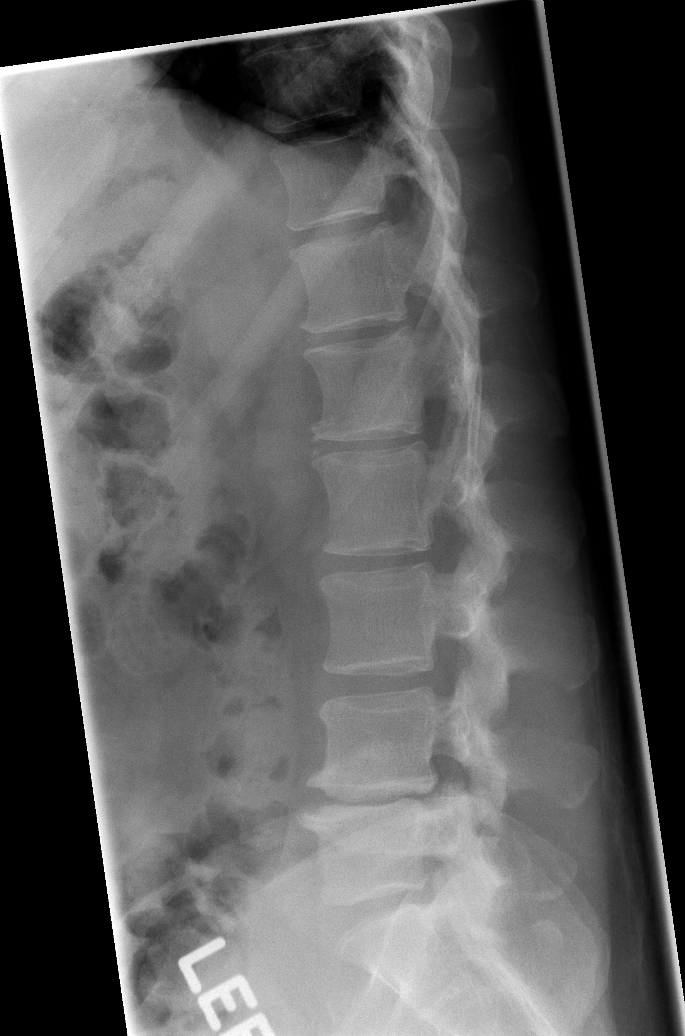

[t l-spine l5-s1 spot]
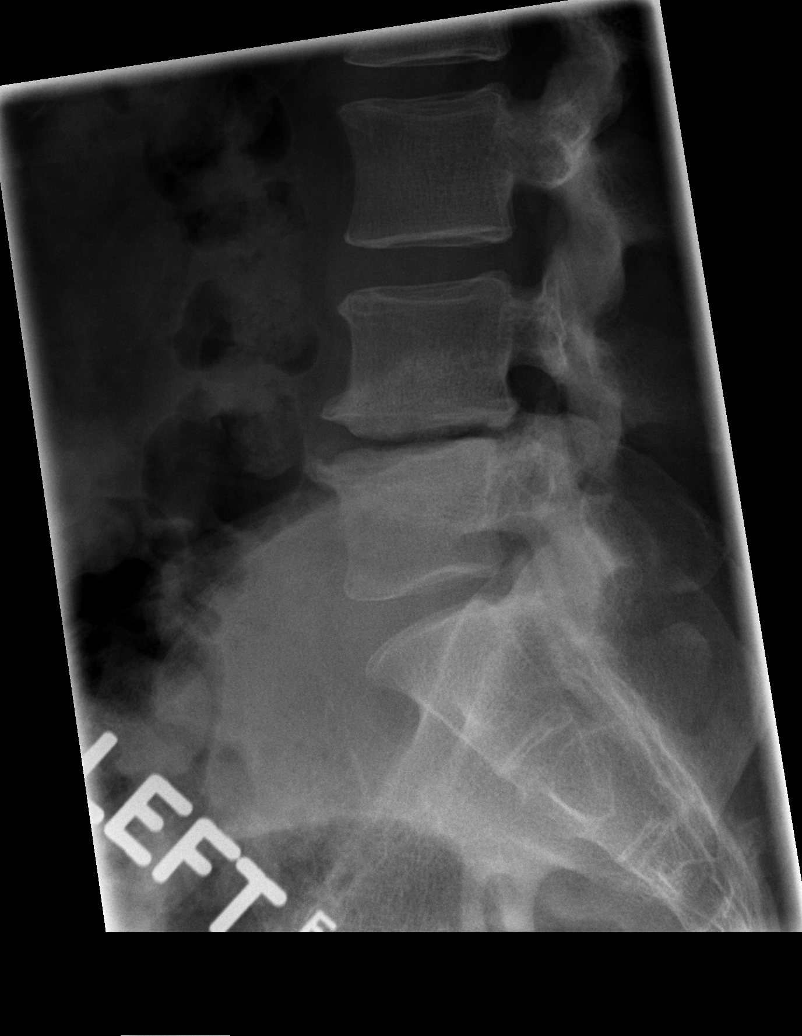

[5 of 5 positions shown; findings below may reference images not displayed]

FINDINGS: Spondylosis noted at L4-5.  There is stable 4 mm of
posterior subluxation of L4-L5 believed to be degenerative in
nature.  Mild facet arthropathy is also present at L5-S1.

No fracture or acute findings.
IMPRESSION: 1.  Stable appearance the lumbar spine, with spondylosis at L4-5
and L5-S1.  There is 4 mm of stable degenerative posterior
subluxation of L4 on L5.

## 2009-11-01 ENCOUNTER — Ambulatory Visit: Payer: Self-pay | Admitting: Internal Medicine

## 2009-11-01 DIAGNOSIS — D61818 Other pancytopenia: Secondary | ICD-10-CM | POA: Insufficient documentation

## 2009-11-08 LAB — CONVERTED CEMR LAB
Basophils Absolute: 0 10*3/uL (ref 0.0–0.1)
Eosinophils Absolute: 0.2 10*3/uL (ref 0.0–0.7)
MCHC: 33.3 g/dL (ref 30.0–36.0)
MCV: 76.5 fL — ABNORMAL LOW (ref 78.0–100.0)
Monocytes Absolute: 0.4 10*3/uL (ref 0.1–1.0)
Neutrophils Relative %: 33.4 % — ABNORMAL LOW (ref 43.0–77.0)
Platelets: 145 10*3/uL — ABNORMAL LOW (ref 150.0–400.0)
RDW: 18.1 % — ABNORMAL HIGH (ref 11.5–14.6)
WBC: 3.3 10*3/uL — ABNORMAL LOW (ref 4.5–10.5)

## 2009-11-08 IMAGING — CT CT ANGIO PELVIS
2 of 12 series · 11 of 46 positions shown, 15 images · IV contrast (APPLIED)
Comparison: Unenhanced CT of the abdomen 08/31/2006.  No comparison
chest CT or pelvic CT.

CTA CHEST

CLINICAL DATA: Back pain.  Rule out dissection.

CT ANGIOGRAPHY CHEST, ABDOMEN AND PELVIS
TECHNIQUE: Multidetector CT imaging through the chest, abdomen and
pelvis was performed using the standard protocol during bolus
administration of intravenous contrast. Multiplanar reconstructed
images including MIPs were obtained and reviewed to evaluate the
vascular anatomy.
Contrast: 80 ml Omnipaque. 300

[Series 5: dissection 2.0 st · axial · 0.65mm/px · z∈[-440,+6]mm · 10 of 255 slices shown, 13 images]
[im 16/255  soft-tissue]
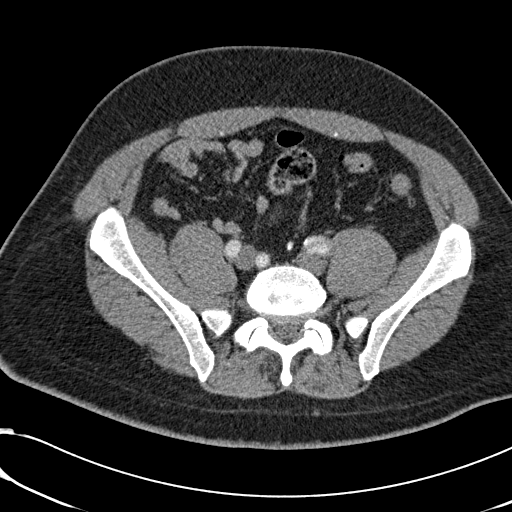
[im 16/255  bone]
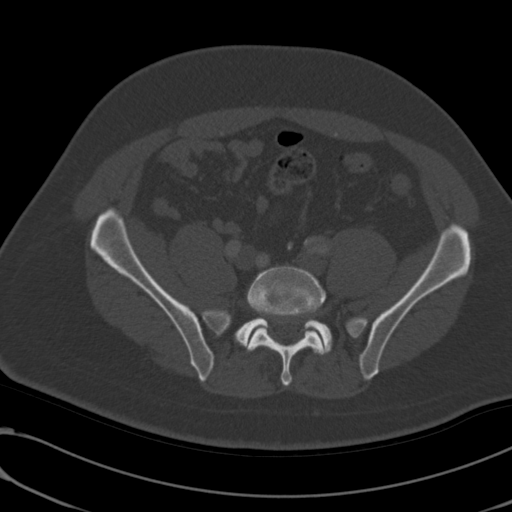
[im 48/255  soft-tissue]
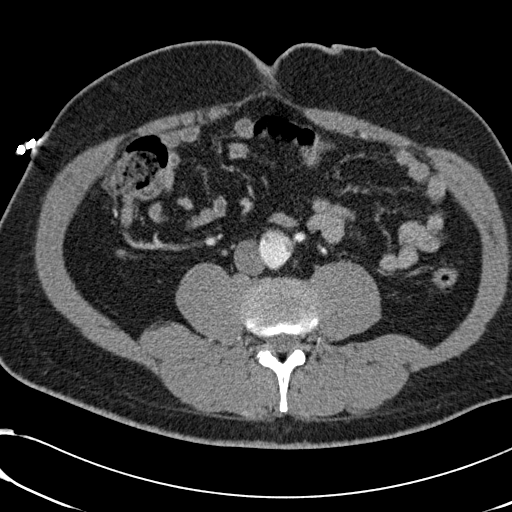
[im 80/255  soft-tissue]
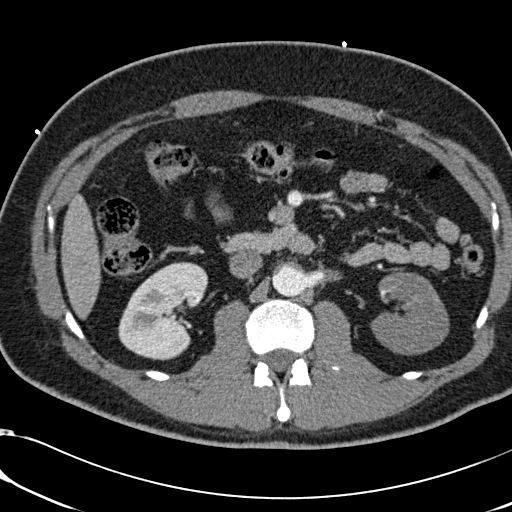
[im 112/255  soft-tissue]
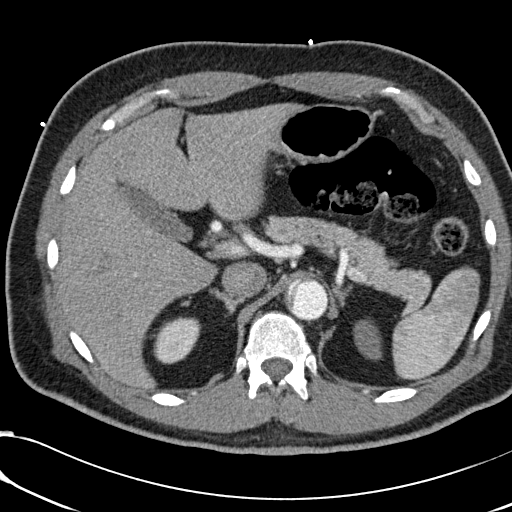
[im 143/255  soft-tissue]
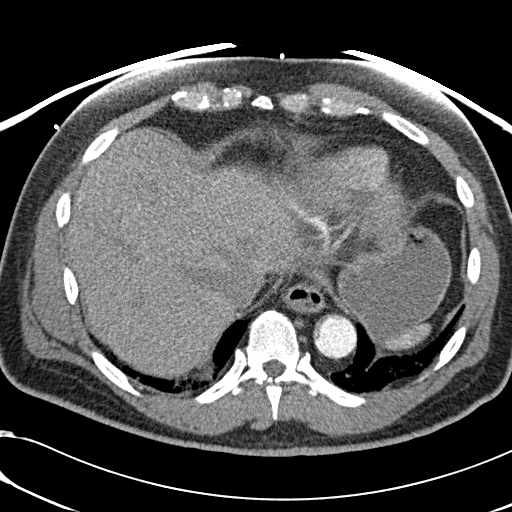
[im 175/255  soft-tissue]
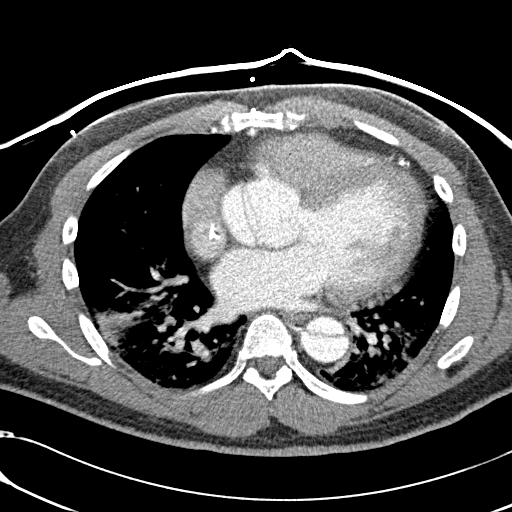
[im 191/255  lung]
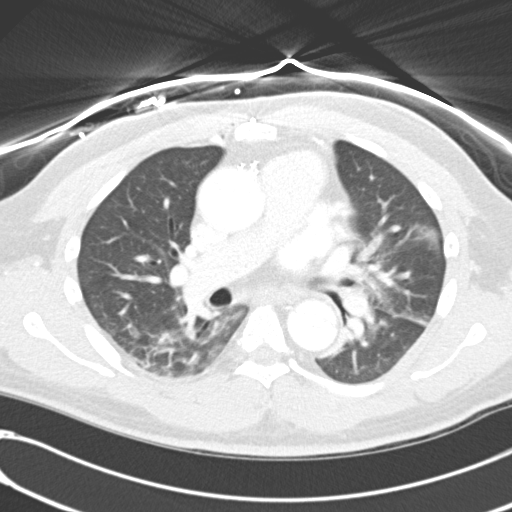
[im 207/255  soft-tissue]
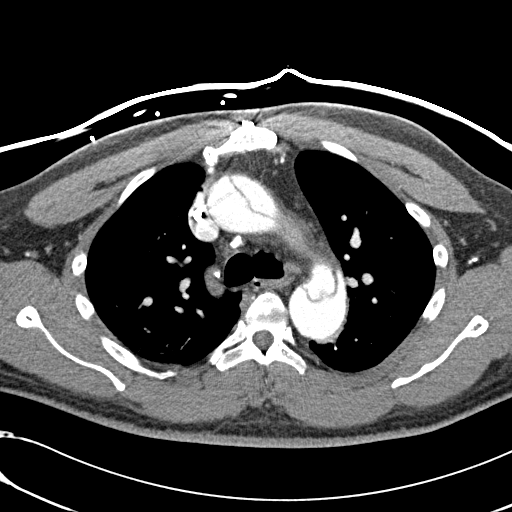
[im 207/255  lung]
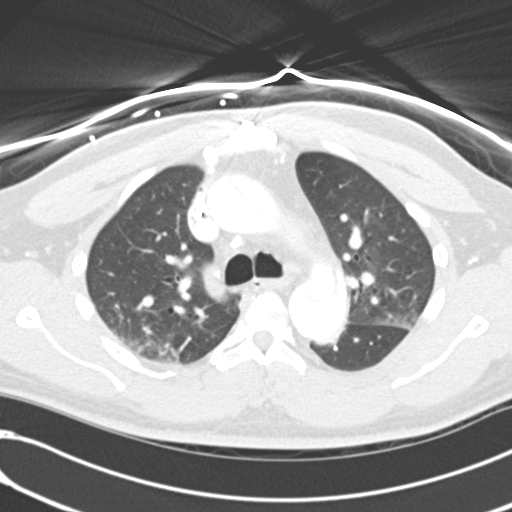
[im 223/255  lung]
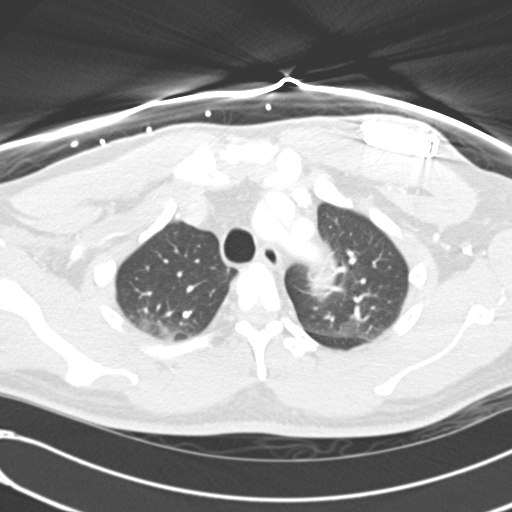
[im 239/255  soft-tissue]
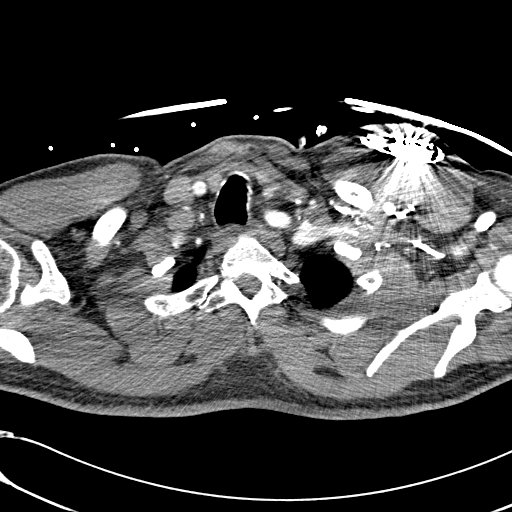
[im 239/255  lung]
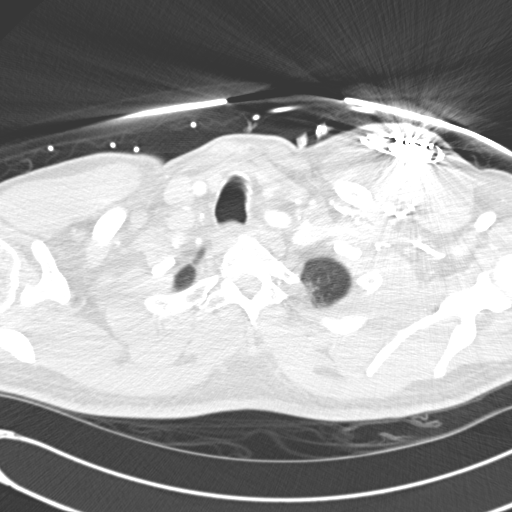

[Series 607: <mpr thick range> · coronal · 0.99mm/px · 1 of 98 slices shown, 2 images]
[im 49/98  soft-tissue]
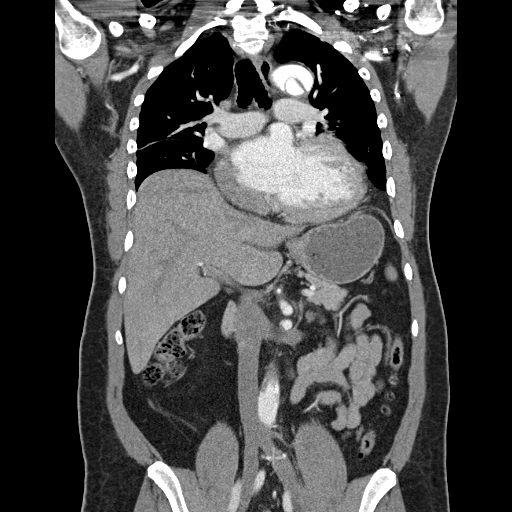
[im 49/98  bone]
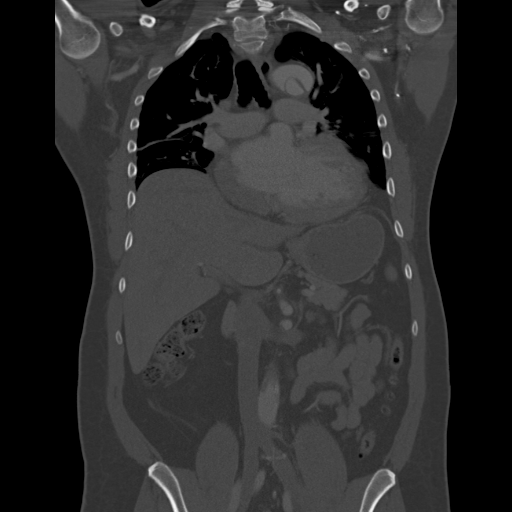

[11 of 46 positions shown; findings below may reference images not displayed]

FINDINGS: The the patient has not to contrast.  As this was an
emergent examination, the the patient received 80 mg of Solu-Medrol
and 50 mg of Benadryl with CT exam performed within 30 minutes.
This protocol was per Dr. Ninsa Nino who has used this premedication
protocol with success prior to prior cardiac catheterization.
Additionally, the patient's creatinine is slightly elevated at 1.6.
It was deemed that the benefit of performing procedure and fat Kaki
in the risks.  The patient will be hydrated.  This is done in
conjunction with Dr. Ninsa Nino . No immediate complications.

AICD is in place entering from left with the tip in the region of
the right ventricle.  Status post CABG.  Cardiomegaly.  Prominent
coronary artery calcifications .

Aortic dissection originates in the proximal ascending thoracic
aorta extends throughout the thoracic aorta into the abdominal
aorta and iliac arteries as discussed below.  There is extension
into the great vessels.  Common origin (bovine configuration) right
innominate artery and left common carotid artery with dissection
extending into the common origin of these vessels.  There is
extension of dissection into the base of the left subclavian
artery.  Kink of the proximal left subclavian artery 2.5 cm above
its origin.  The false lumen appears larger compressing the true
lumen.  Dissection throughout the descending thoracic aorta with
smaller anterior right lateral true lumen.

Exam was not optimized to evaluate for pulmonary embolus.  No
obvious pulmonary embolus.  Evidence of prior granulomas exposure
as indicated by right upper lobe granuloma calcified and calcified
mediastinal/hilar lymph nodes.

Parenchymal changes most notable involving the lung bases but also
involving small portions of the upper lobes, right middle lobe and
lingula may represent atelectatic changes.  This limits detection
of subtle infiltrate or mass.  No acute bony abnormality.
Nonspecific at 2.6 x 1.4 cm fatty lesion within the right
diaphragm, this is without change.

 Review of the MIP images confirms the above findings.
IMPRESSION: Significant aortic dissection extending from the ascending aorta
into the base of the great vessels and throughout the descending
thoracic aorta.  The ascending thoracic aorta is dilated measuring
up to 4.1 cm.  The patient had a prior CABG procedure. Prominent
coronary calcification atherosclerotic disease.

Cardiomegaly.  AICD in place.

Pulmonary parenchymal changes may represent atelectasis.

Evidence of prior granulomas exposure.

CTA ABDOMEN
FINDINGS: Aortic dissection extends into the abdominal aorta with
the false lumen markedly compressing the true lumen the right.  The
dissection extends towards the celiac axis origin which predominate
originates from the false lumen as well as into the Guenther artery.
Right renal artery originates from the compressed true lumen
whereas the left renal arteries dissected with the left kidney.
Inferior mesenteric artery arises from the true lumen.  Dissection
extends into the common iliac arteries as discussed below.

Arterial phase imaging of the visceral structures reveals:  Fatty
liver.  Scarring of the inferior aspect of the right kidney.  No
adrenal, splenic or pancreatic mass.  Mild thickening distal
esophagus.  Question minimal amount of gallbladder sludge.

 Review of the MIP images confirms the above findings. .
IMPRESSION: Aortic dissection extends throughout the abdominal aorta into the
iliac arteries as discussed above.

The left kidney does not perfused secondary to left renal artery
dissection.

CTA PELVIS
FINDINGS: Aortic dissection extends into the common iliac arteries.
On the right, this dissection ends at the level of the superior
aspect of the right external iliac artery.  On the left, dissection
extends into the bifurcation and partially into the left internal
iliac artery.  The extension into the left external iliac artery
appears to cause thrombosis throughout a majority of the left
external iliac artery.

The left iliopsoas muscle appears slightly more prominent size than
the right.  The slight hypodensity associate with such may be
artifactual origin.  Overall significance of this is indeterminate.
Attention to this on follow-up.

Colonic diverticula without evidence of diverticulitis.  Prominent
degenerative changes L4-5 with mild moderate degenerative changes
L5-S1.  Less notable degenerative changes throughout remainder of
the lumbar spine and lower thoracic spine.

 Review of the MIP images confirms the above findings.
IMPRESSION: Aortic dissection extends into the iliac arteries as discussed
above with thrombosis of the left external iliac artery.

Asymmetric appearance of the iliopsoas muscles with left-side
appearing larger than the right.  Etiology indeterminate.
Attention to this on follow-up.

## 2009-11-09 IMAGING — CR DG CHEST 1V PORT
1 series · 1 of 1 positions shown · non-contrast
Comparison: Portable chest x-ray of 06/22/2009

CLINICAL DATA: Acute coronary syndrome

[view not recorded]
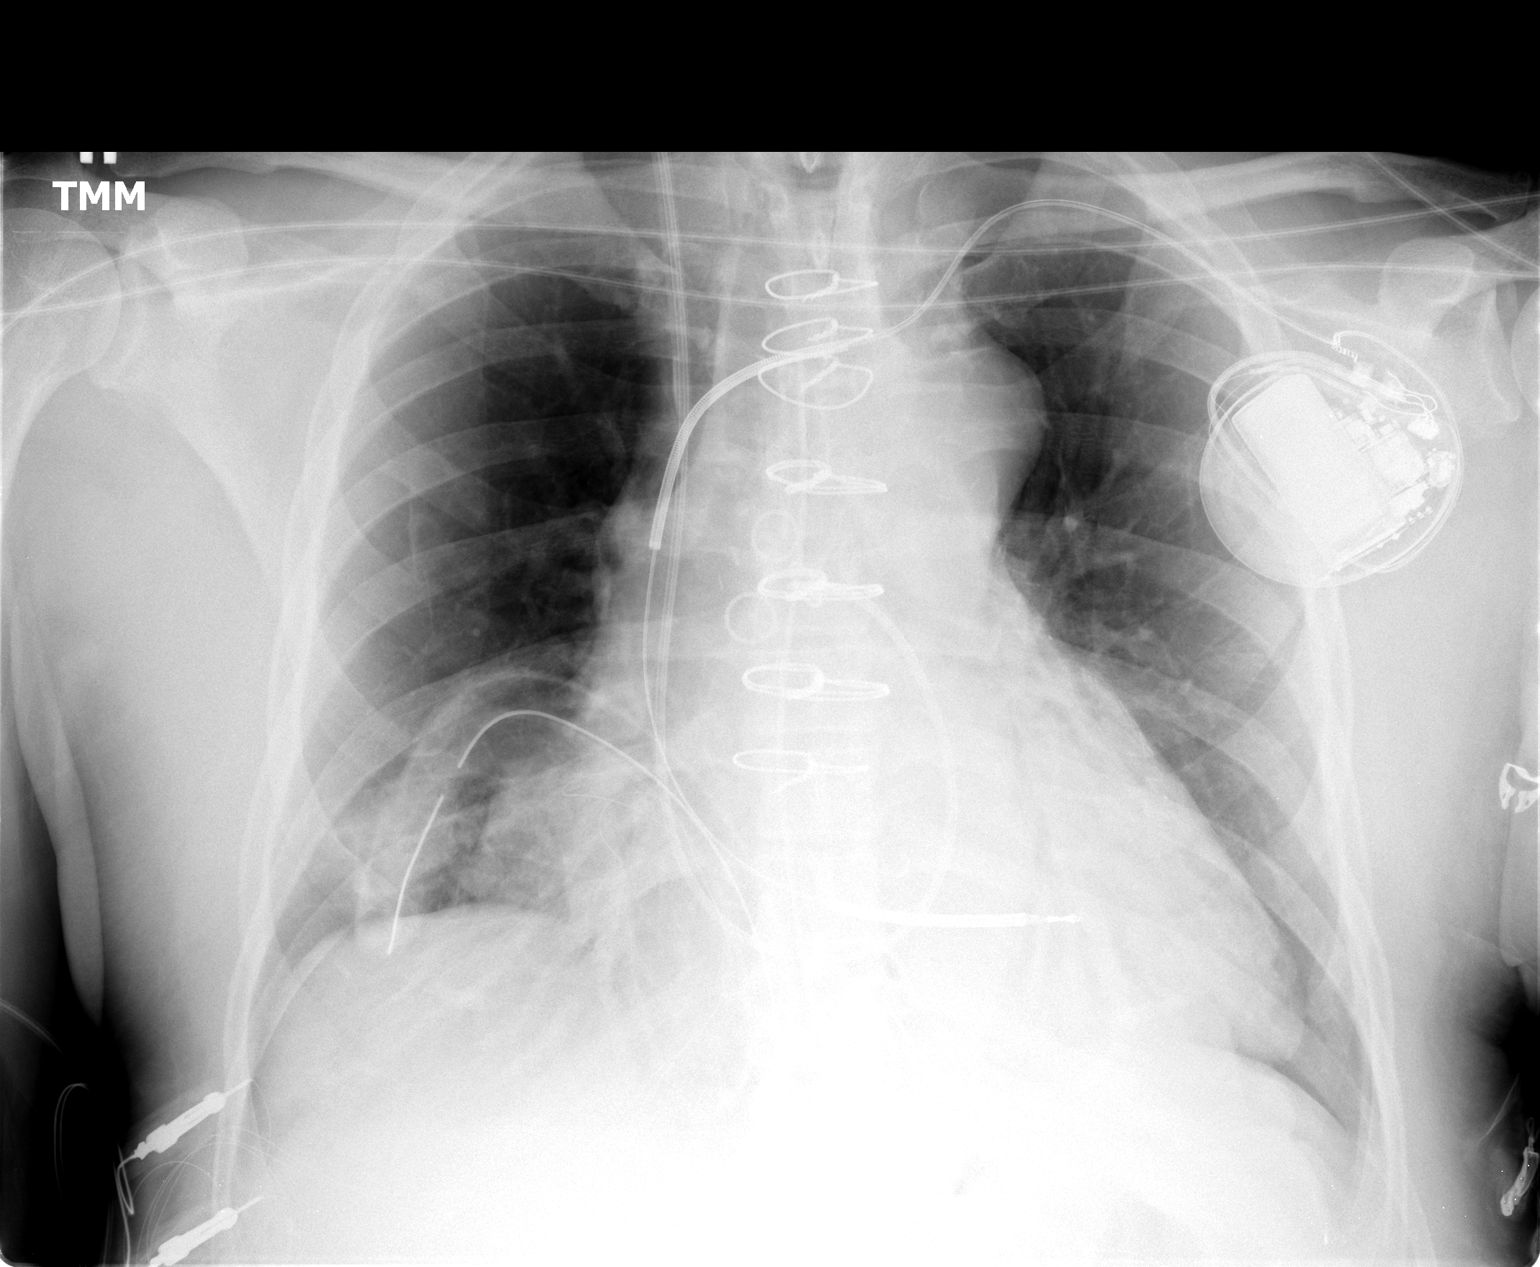

[1 of 1 positions shown; findings below may reference images not displayed]

FINDINGS: The lungs appear better aerated.  Right central venous
catheter is unchanged and permanent pacemaker with AICD lead
remains.  Right chest tube is present.  There may be a tiny right
apical pneumothorax present.  Cardiomegaly is stable.
IMPRESSION: Improved aeration.  There may be a tiny right apical pneumothorax
present.  Right chest tube remains.

## 2009-11-09 IMAGING — CR DG CHEST 1V PORT
1 series · 1 of 1 positions shown · non-contrast
Comparison: 04/27/2009

CLINICAL DATA: Aortic dissection

PORTABLE CHEST - 1 VIEW

[AP]
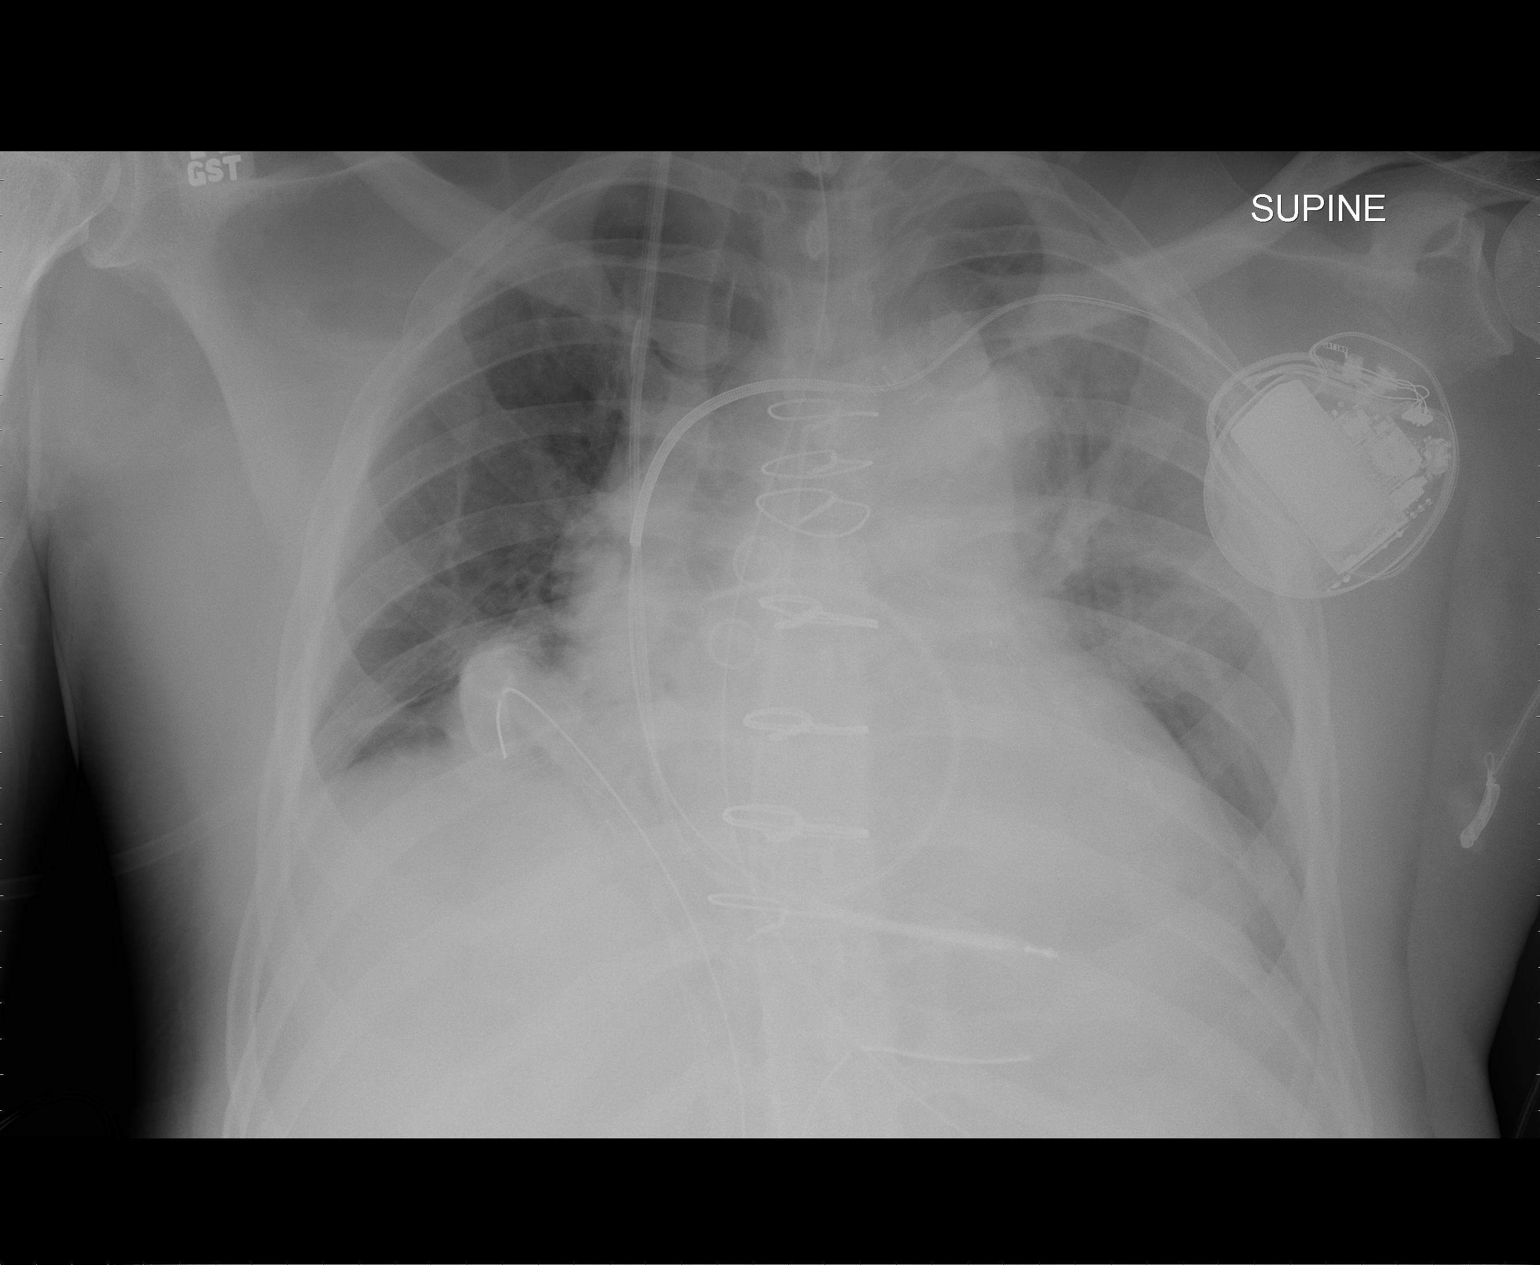

[1 of 1 positions shown; findings below may reference images not displayed]

FINDINGS: Previous CABG.  Revision of median sternotomy.  Left
subclavian AICD stable.  Right IJ Servando extends to the proximal
right pulmonary artery.  Nasogastric tube extends at least as far
as the stomach, tip not seen.  Mediastinal drain and right chest
tube are in place.  No pneumothorax.  Low lung volumes with
crowding of bronchovascular structures.  Probable central pulmonary
vascular congestion and mild perihilar edema.  There is bilateral
infrahilar consolidation / atelectasis.  No definite effusion.
IMPRESSION: 1.  Postop changes with support hardware in expected location.
2.  Low volumes with perihilar edema and infrahilar consolidation /
atelectasis.

## 2009-11-10 IMAGING — CR DG CHEST 1V PORT
1 series · 1 of 1 positions shown · non-contrast
Comparison: the previous day's study

CLINICAL DATA: Aortic aneurysm

PORTABLE CHEST - 1 VIEW

[view not recorded]
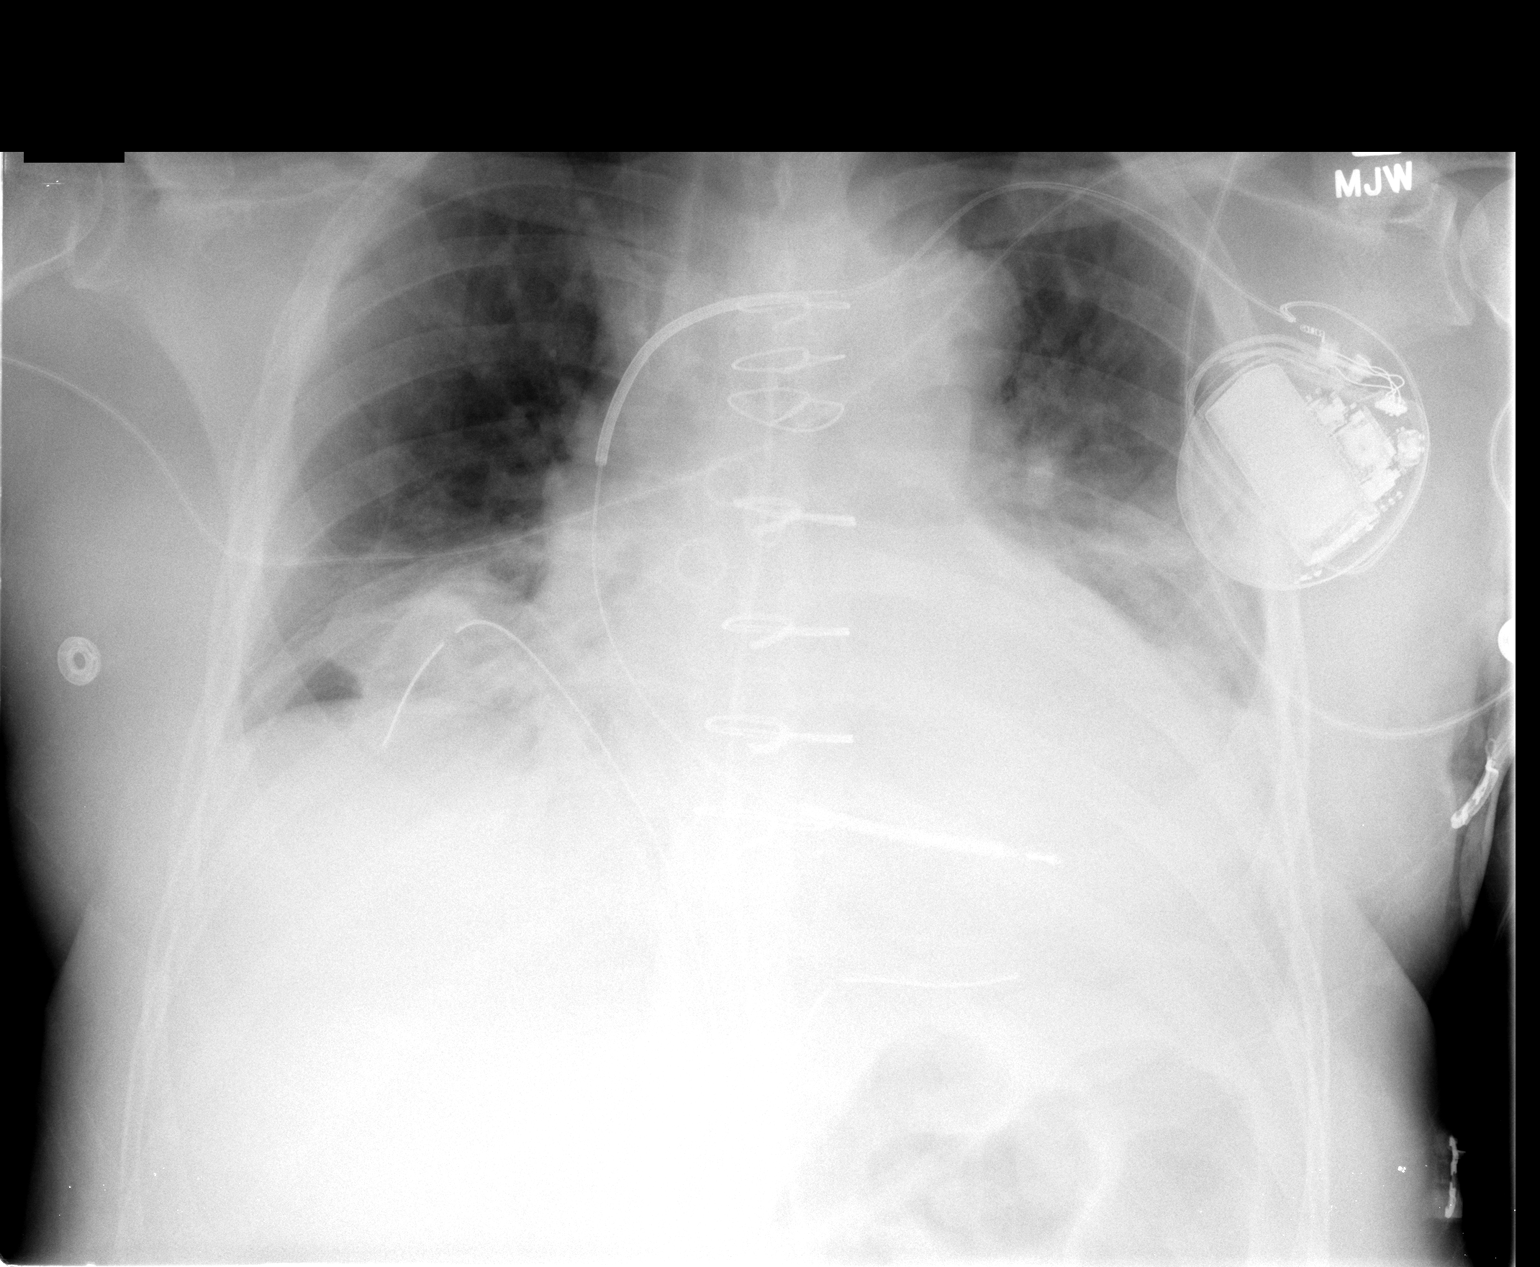

[1 of 1 positions shown; findings below may reference images not displayed]

FINDINGS: Right IJ Geovanny has been retracted leaving the venous
sheath in place.  Right chest tube in place with no pneumothorax.
Left subclavian AICD stable.  Previous CABG.  Mediastinal drains
remain.  Lower lung volumes with significantly increased bibasilar
atelectasis or consolidation.  There may be a new left pleural
effusion.  Cannot exclude mild central pulmonary vascular
congestion.
IMPRESSION: 1.  Lower lung volumes with worsening bibasilar
atelectasis/consolidation.
2. Support hardware stable in position.

## 2009-11-11 IMAGING — CR DG CHEST 1V PORT
1 series · 1 of 1 positions shown · non-contrast
Comparison: 06/24/2009 [DATE]

CLINICAL DATA: PICC line placement.

PORTABLE CHEST - 1 VIEW

[AP]
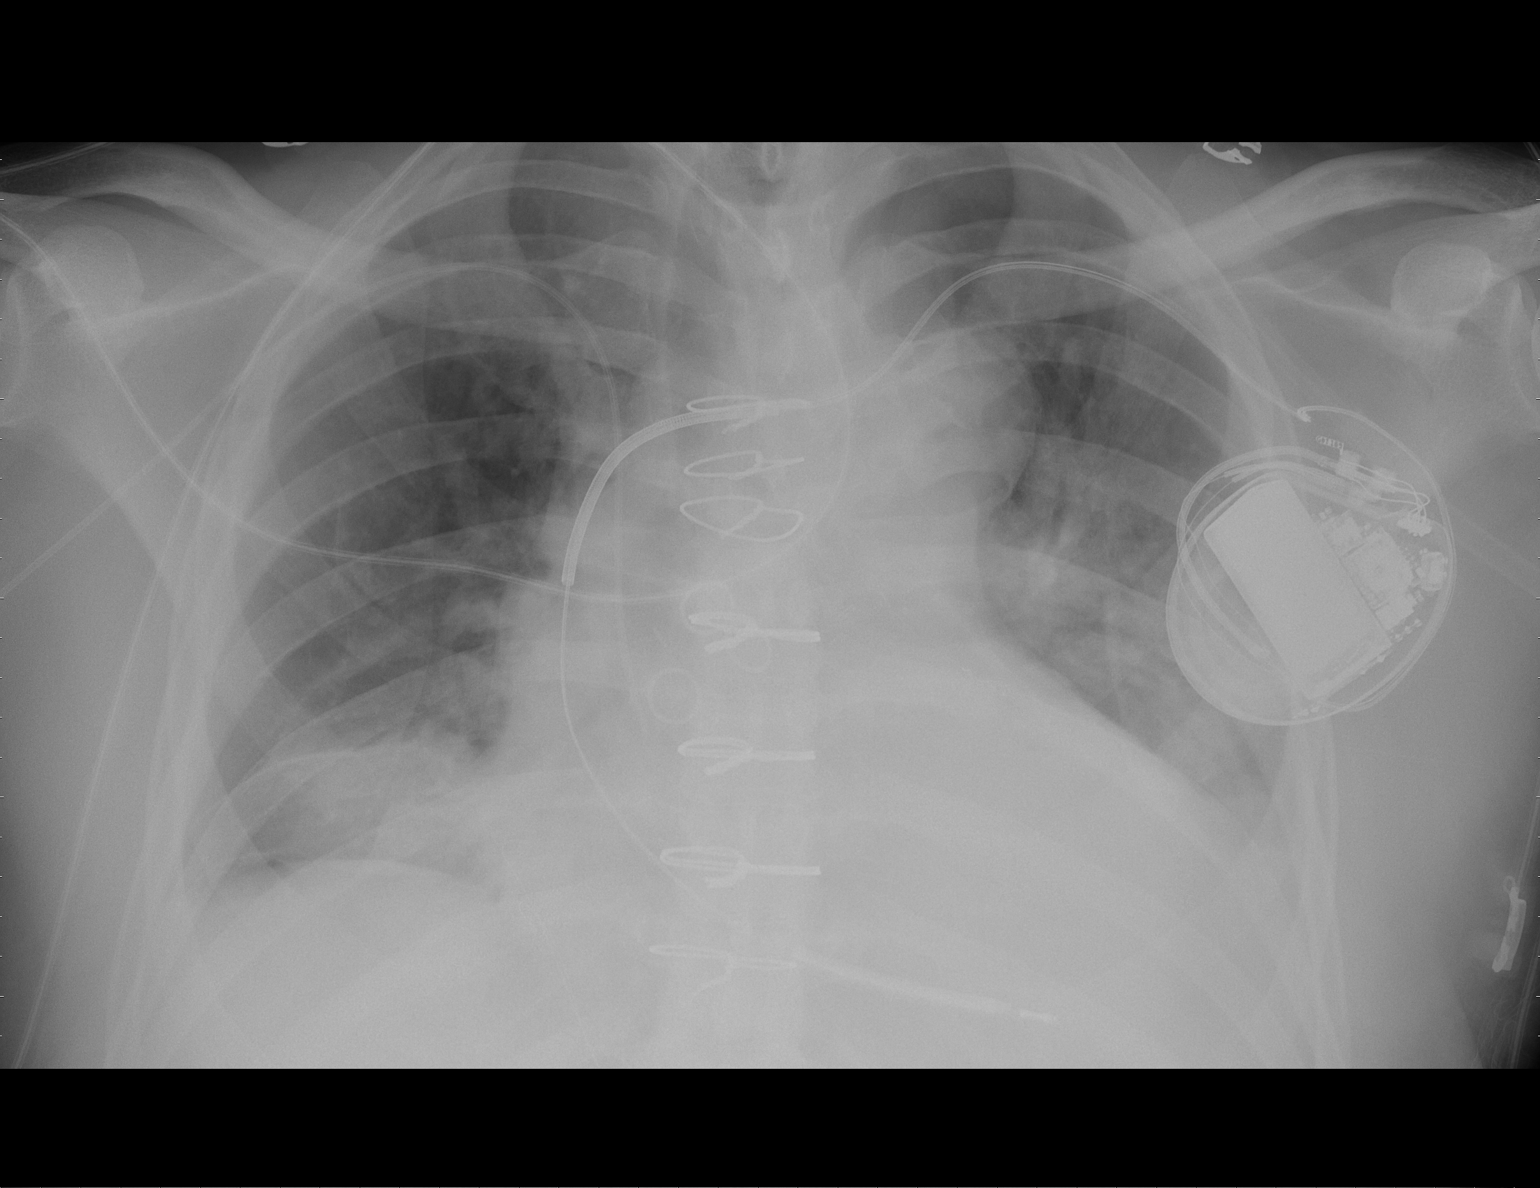

[1 of 1 positions shown; findings below may reference images not displayed]

FINDINGS: Right PICC line tip distal superior vena cava.  Small
left apical pneumothorax remains proximally 5%.  AICD in place.
Cardiomegaly.  Tortuous aorta.  Pulmonary vascular congestion/mild
asymmetric pulmonary edema greater on the left.  Basilar opacity
may be related to atelectasis and / or infiltrate.
IMPRESSION: Right PICC line tip distal superior vena cava.

Small left apical pneumothorax remains proximally 5%.

AICD in place.  Cardiomegaly.

Tortuous aorta.

Pulmonary vascular congestion/mild asymmetric pulmonary edema
greater on the left.  Basilar opacity may be related to atelectasis
and / or infiltrate.

This is a call report.

## 2009-11-12 IMAGING — CR DG CHEST 1V PORT
1 series · 1 of 1 positions shown · non-contrast
Comparison: Chest radiograph 06/24/2009

CLINICAL DATA: AICD shock, dissection

PORTABLE CHEST - 1 VIEW

[AP]
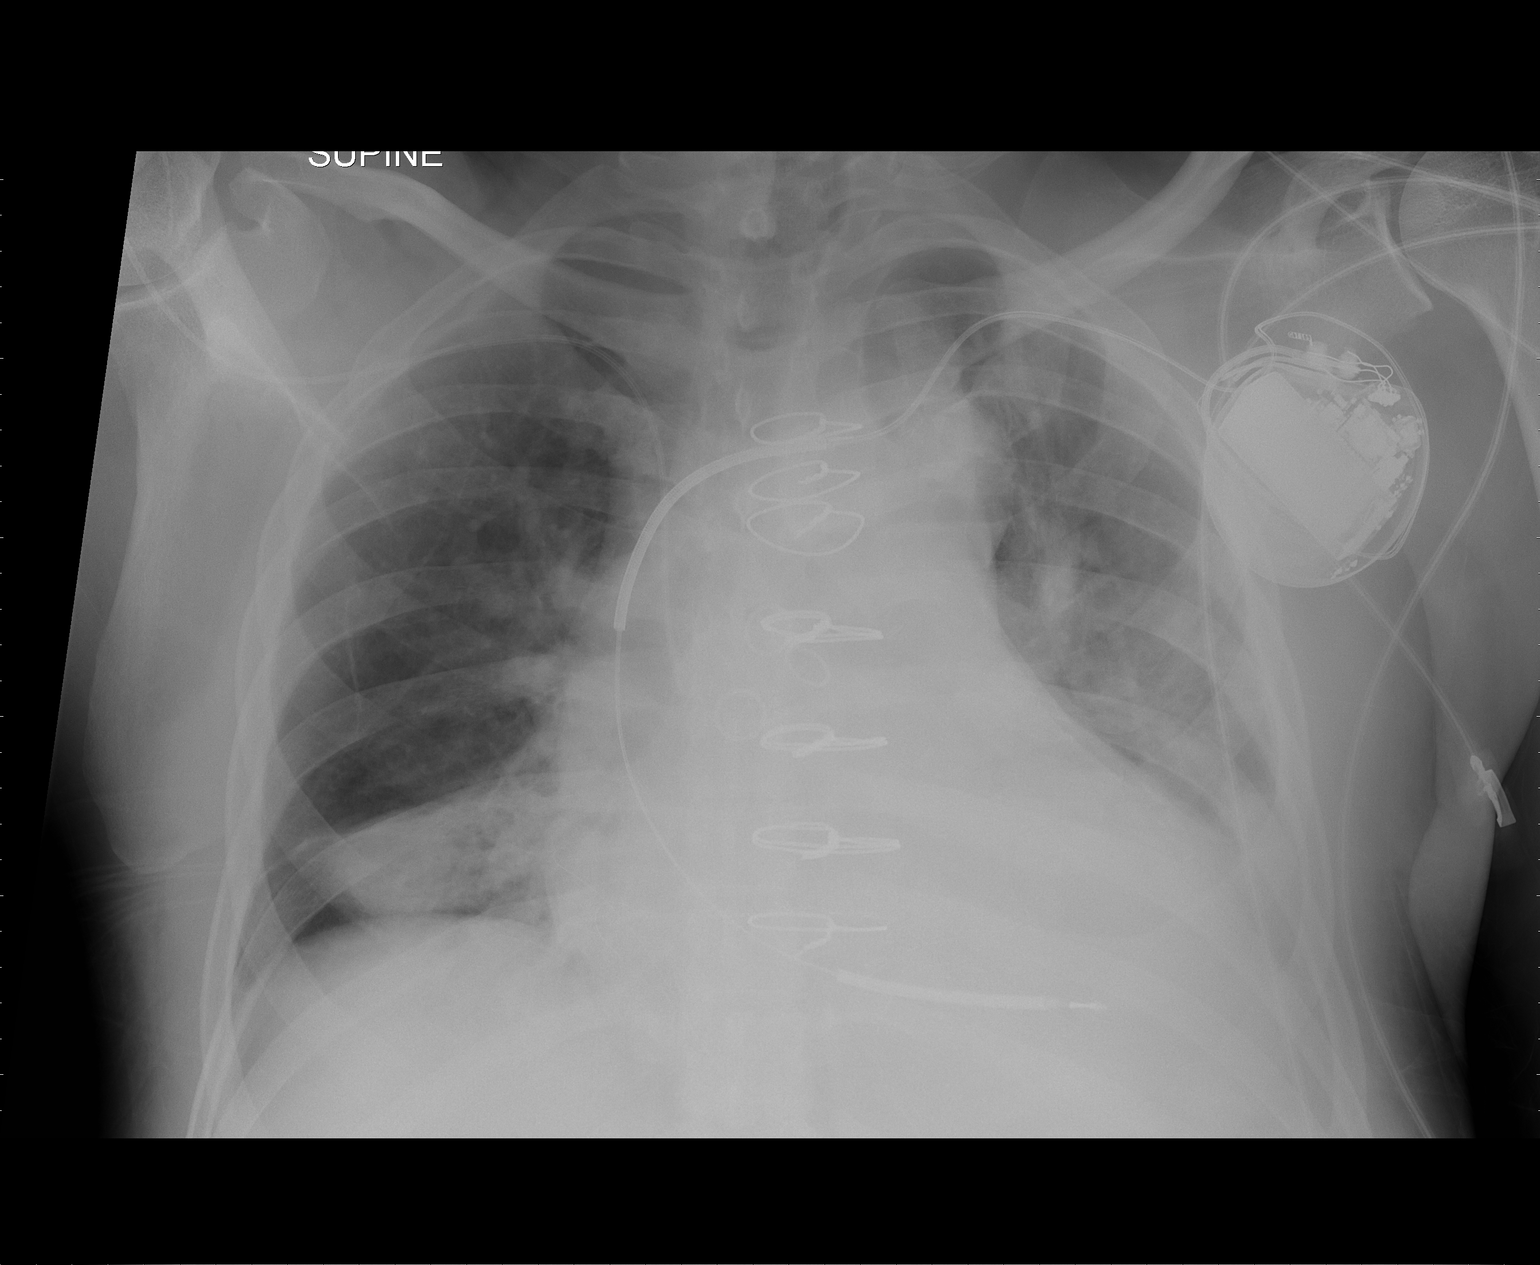

[1 of 1 positions shown; findings below may reference images not displayed]

FINDINGS: Left-sided AICD and right PICC line unchanged.  Stable
enlarged cardiac silhouette.  There is a left pleural effusion and
dense left lower lobe atelectasis.  More focal opacity noted in the
right lower lobe.  Interval decrease in volume of small left apical
pneumothorax.
IMPRESSION: 1.  Interval decrease in volume of small left apical pneumothorax.
2.  No change in left lower lobe atelectasis and effusion and right
lower lobe atelectasis/infiltrate.

## 2009-11-13 IMAGING — CR DG CHEST 2V
2 series · 2 of 2 positions shown · non-contrast
Comparison: Chest x-ray of 06/25/2009

CLINICAL DATA: CABG, follow-up

CHEST - 2 VIEW

[w chest pa]
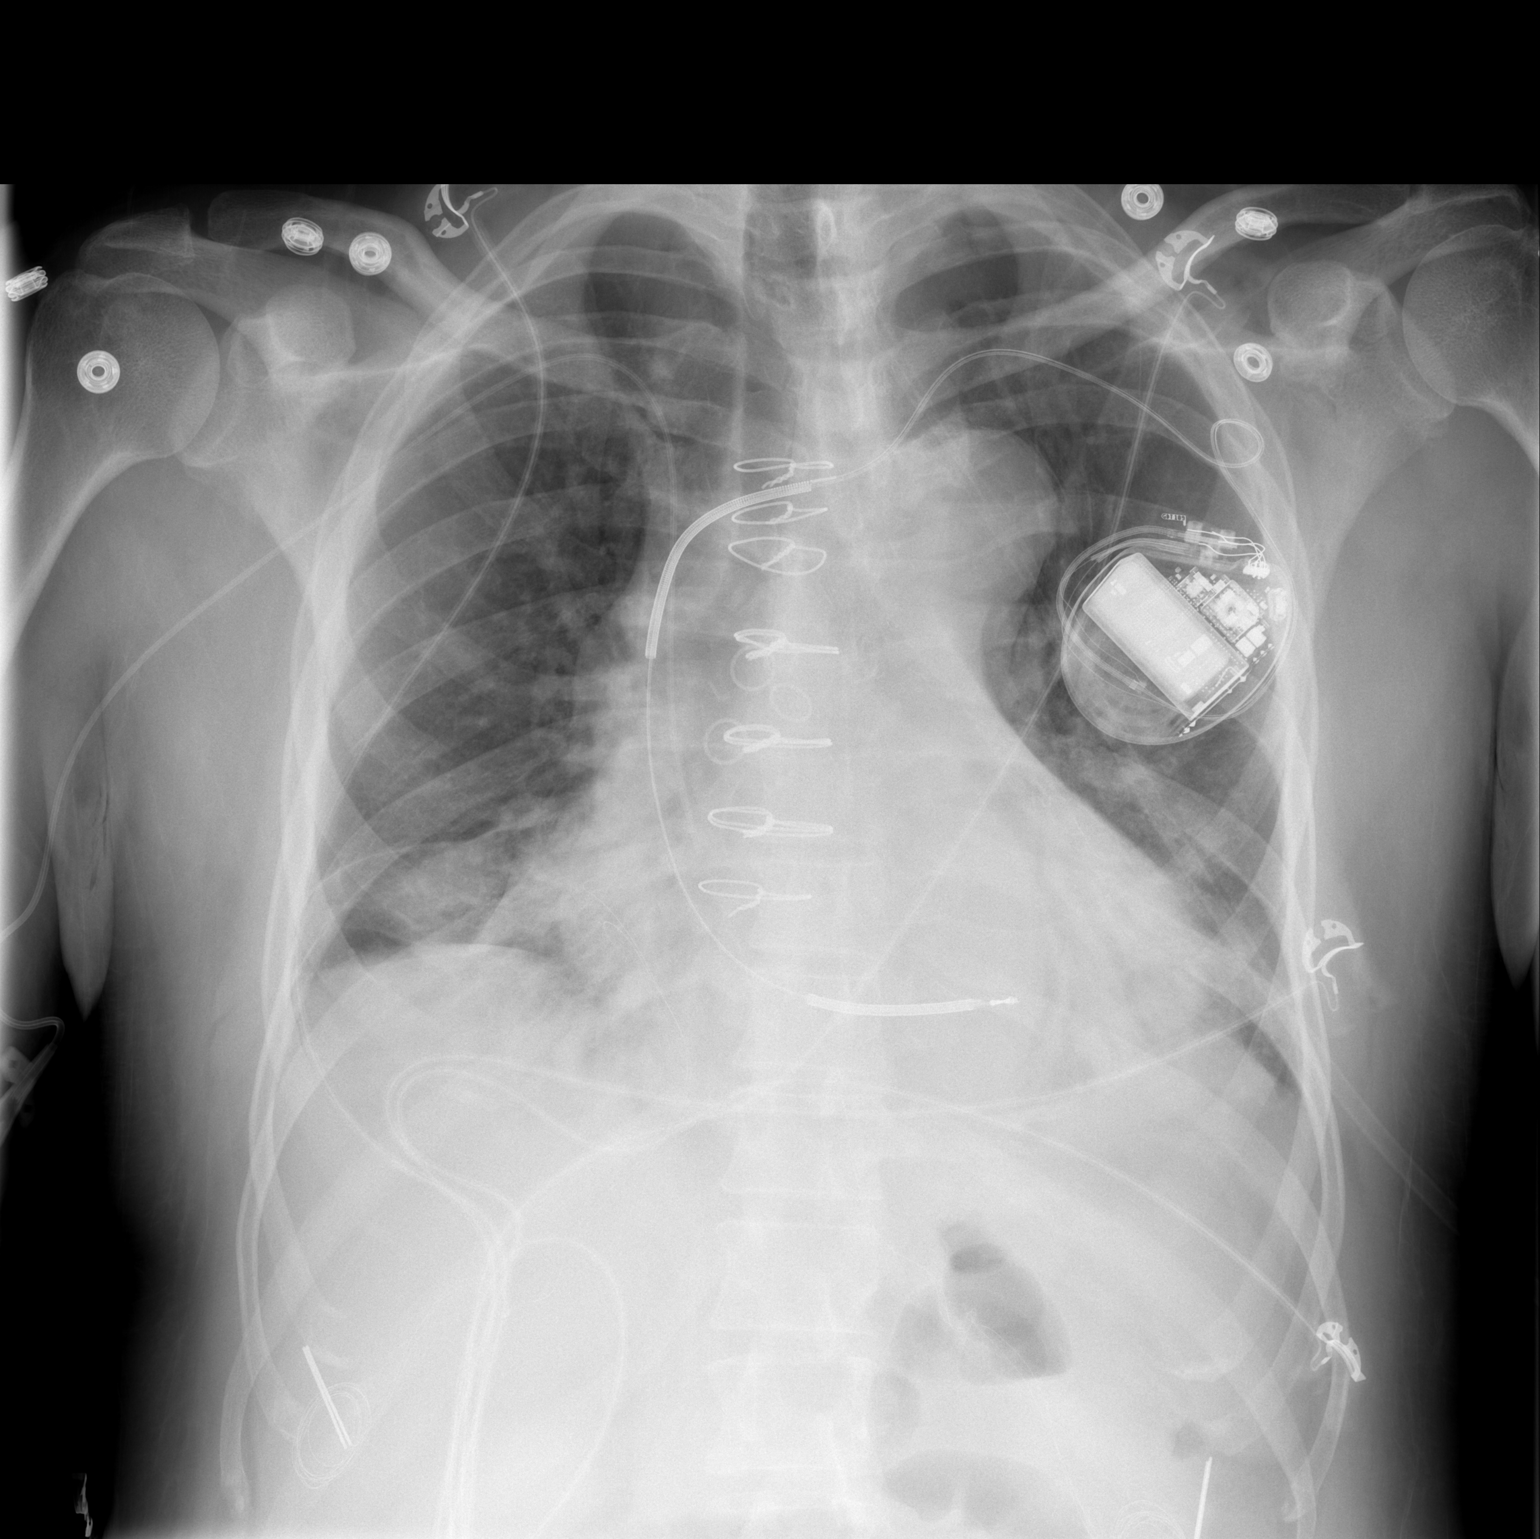

[w chest lat]
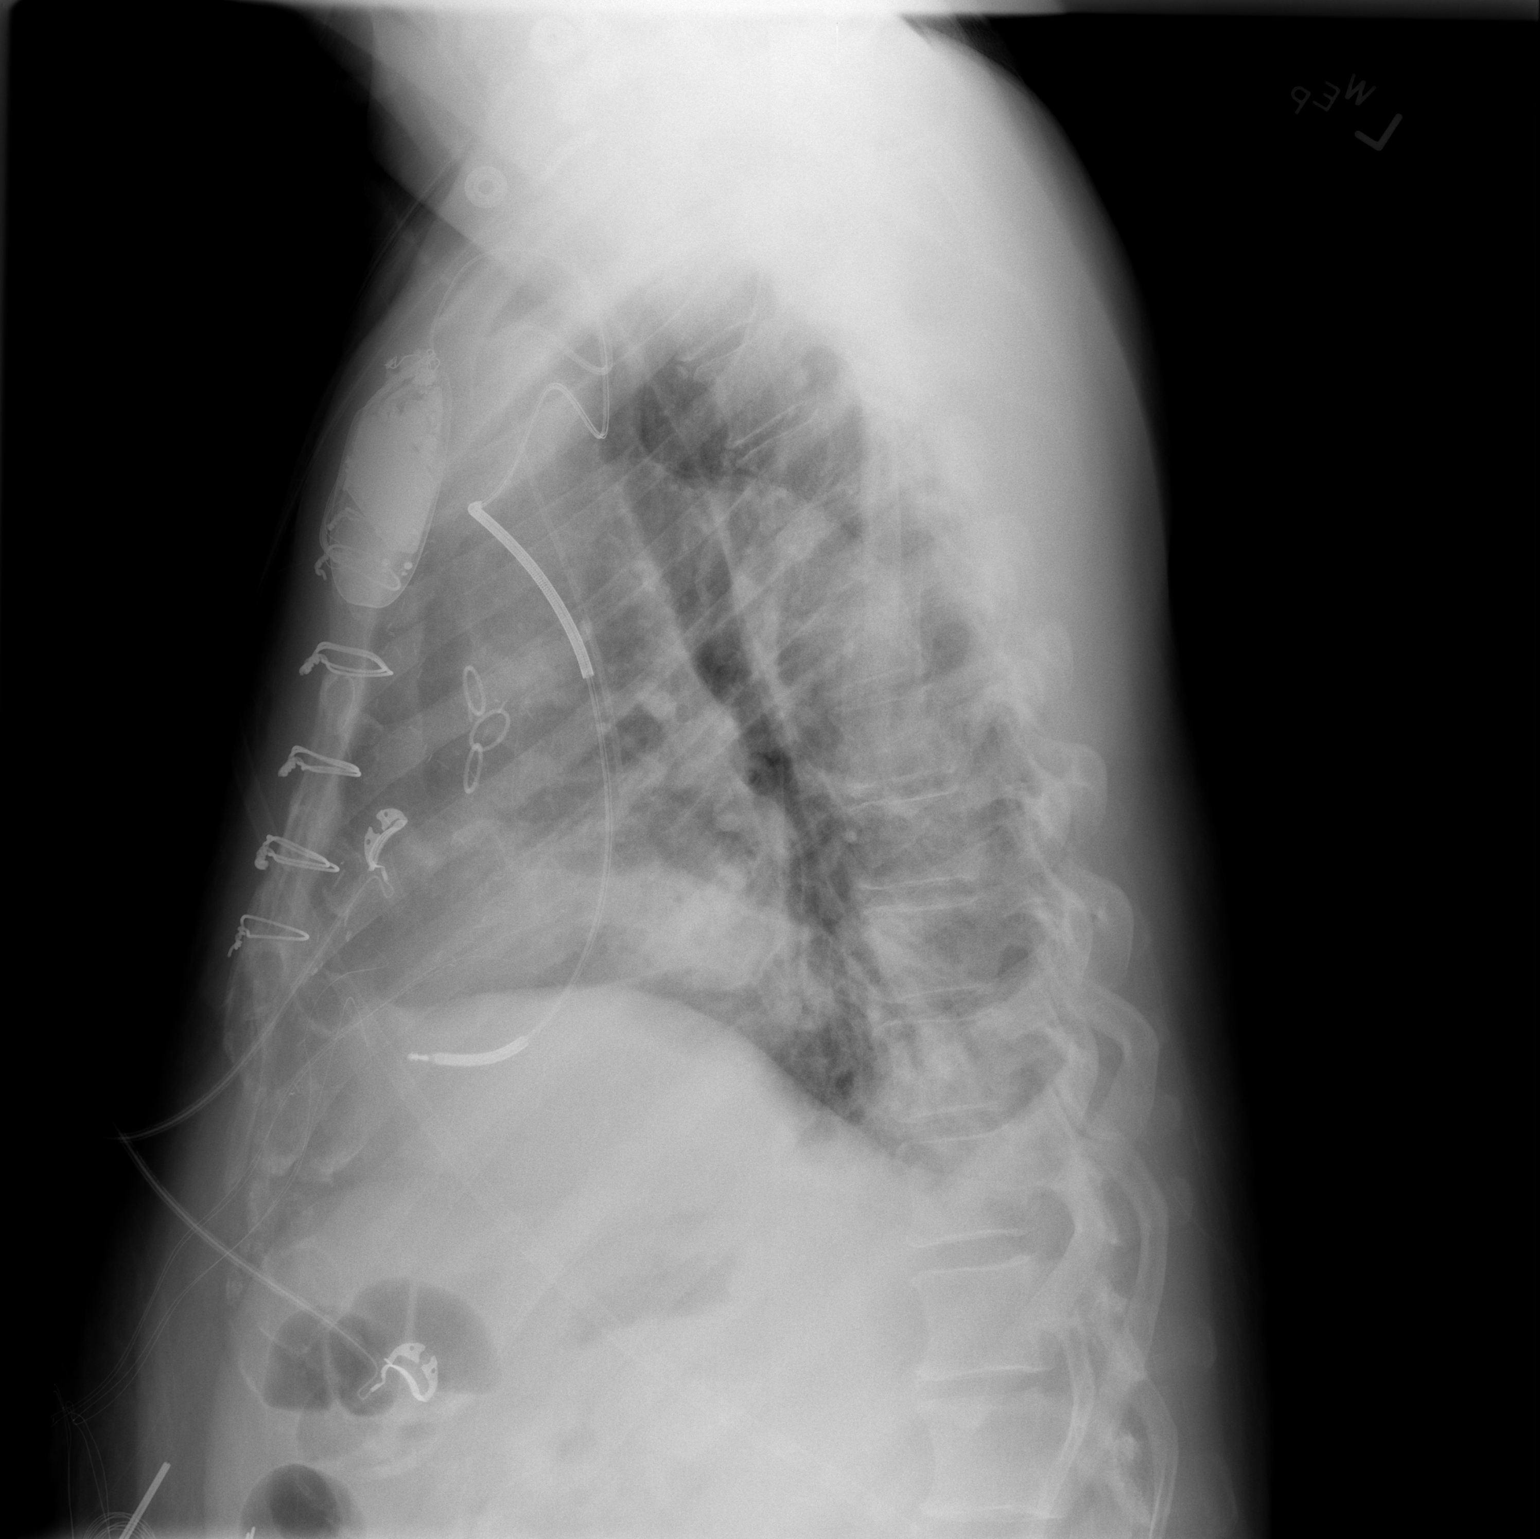

[2 of 2 positions shown; findings below may reference images not displayed]

FINDINGS: Aeration has improved.  Basilar opacities remain most
consistent with atelectasis.  Cardiomegaly is stable.  Right PICC
line and permanent pacemaker remains.  No definite residual
pneumothorax is seen.
IMPRESSION: Improved aeration with improving basilar atelectasis.  No definite
pneumothorax.

## 2009-11-18 IMAGING — CR DG CHEST 1V PORT
1 series · 1 of 1 positions shown · non-contrast
Comparison: 06/26/2009 and earlier.

CLINICAL DATA: 48-year-old male with chest pain.  Heart surgery on
06/21/2009.

PORTABLE CHEST - 1 VIEW

[AP]
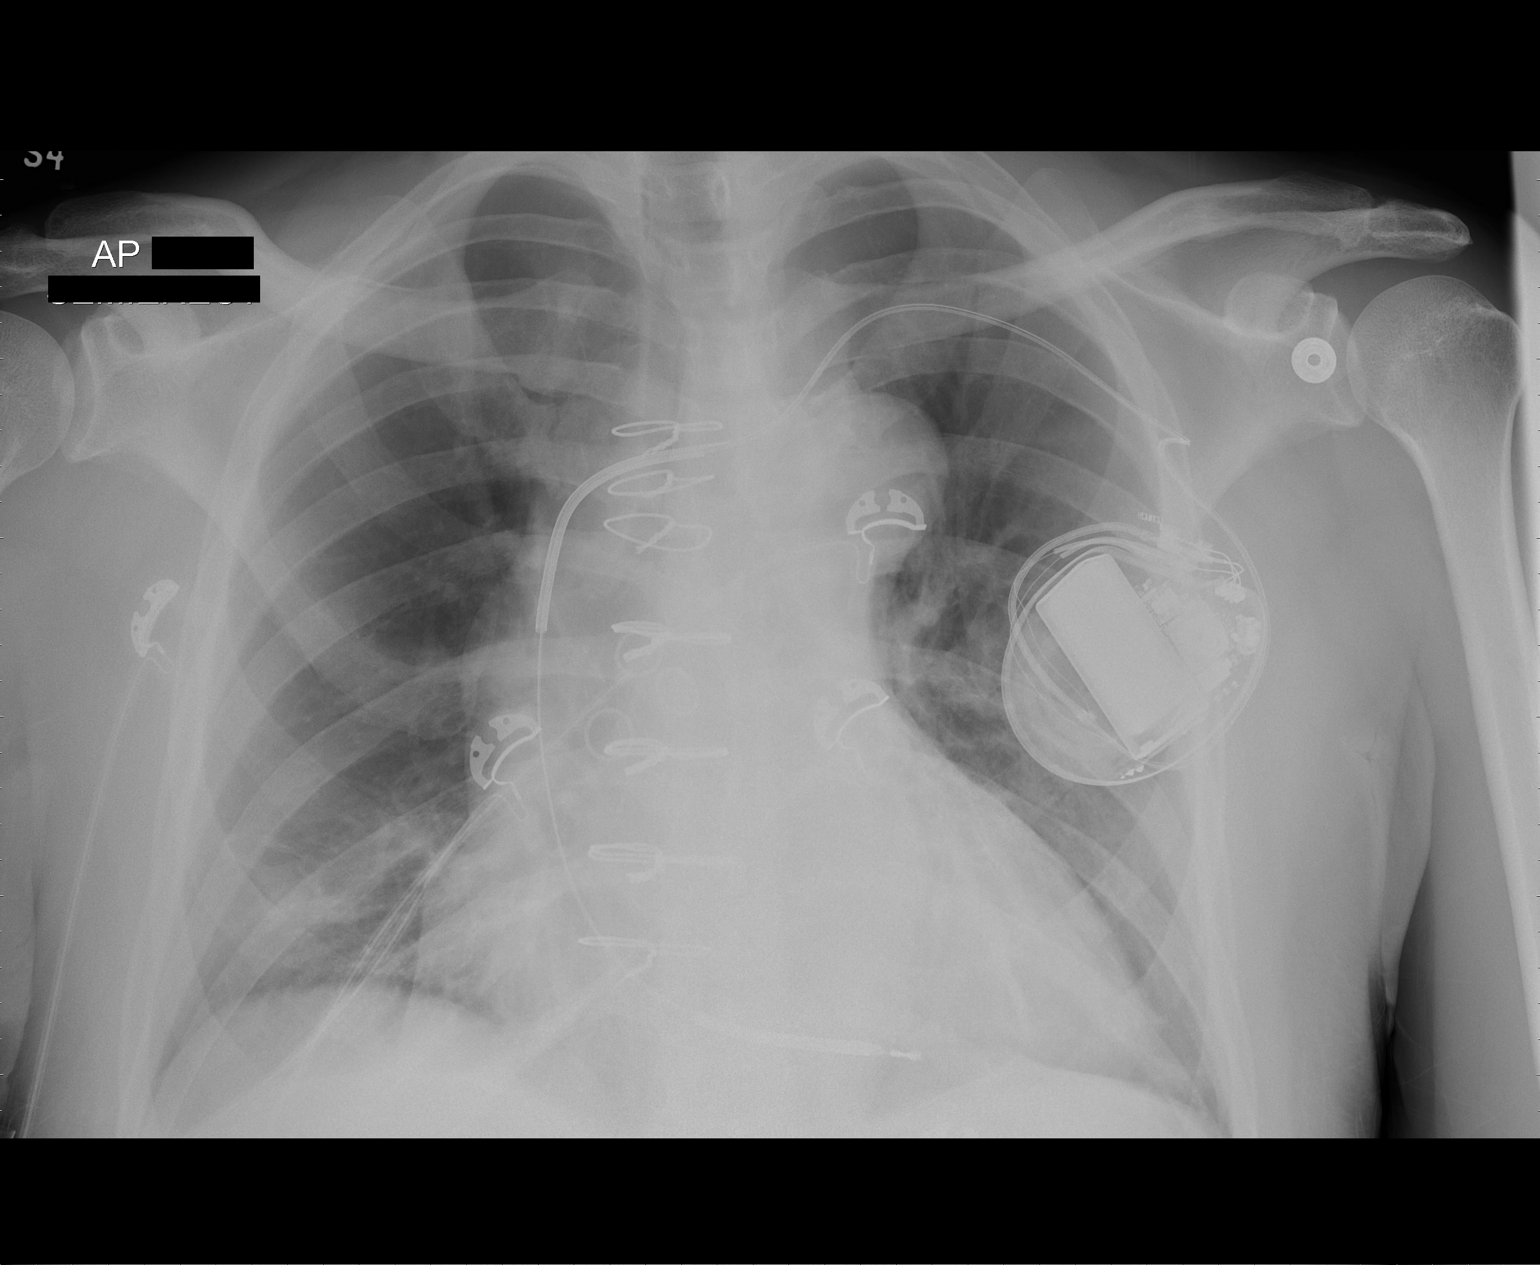

[1 of 1 positions shown; findings below may reference images not displayed]

FINDINGS: AP portable semi upright view 8730 hours.  Stable
sequelae of CABG.  Stable left chest cardiac AICD.  Right PICC line
has been removed. Stable cardiomegaly and mediastinal contours.
Interval improved ventilation at both lung bases with some residual
atelectasis.  No pneumothorax, consolidation, or pulmonary edema.
IMPRESSION: Interval improved bibasilar ventilation.

## 2009-11-21 ENCOUNTER — Inpatient Hospital Stay (HOSPITAL_COMMUNITY): Admission: EM | Admit: 2009-11-21 | Discharge: 2009-11-23 | Payer: Self-pay | Admitting: Emergency Medicine

## 2009-11-21 ENCOUNTER — Ambulatory Visit: Payer: Self-pay | Admitting: Cardiology

## 2009-11-22 ENCOUNTER — Telehealth (INDEPENDENT_AMBULATORY_CARE_PROVIDER_SITE_OTHER): Payer: Self-pay | Admitting: *Deleted

## 2009-11-23 ENCOUNTER — Ambulatory Visit: Payer: Self-pay

## 2009-11-23 ENCOUNTER — Encounter (HOSPITAL_COMMUNITY): Admission: RE | Admit: 2009-11-23 | Discharge: 2009-12-22 | Payer: Self-pay | Admitting: Internal Medicine

## 2009-11-23 ENCOUNTER — Ambulatory Visit: Payer: Self-pay | Admitting: Cardiovascular Disease

## 2009-12-04 IMAGING — CT CT HEAD W/O CM
1 series · 16 of 30 positions shown, 20 images · non-contrast
Comparison: 07/04/2009.

CLINICAL DATA: Severe headache.

CT HEAD WITHOUT CONTRAST
TECHNIQUE: Contiguous axial images were obtained from the base of
the skull through the vertex without contrast.

[Series 2: head routine 4.8 h37s · axial · 0.46mm/px · z∈[-86,+79]mm · 16 of 36 slices shown, 20 images]
[im 2/36  brain]
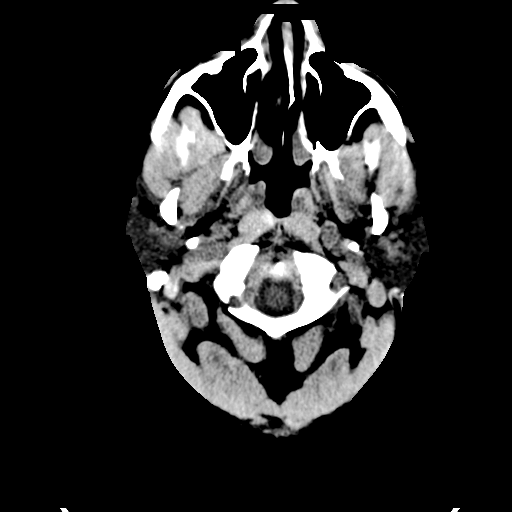
[im 2/36  bone]
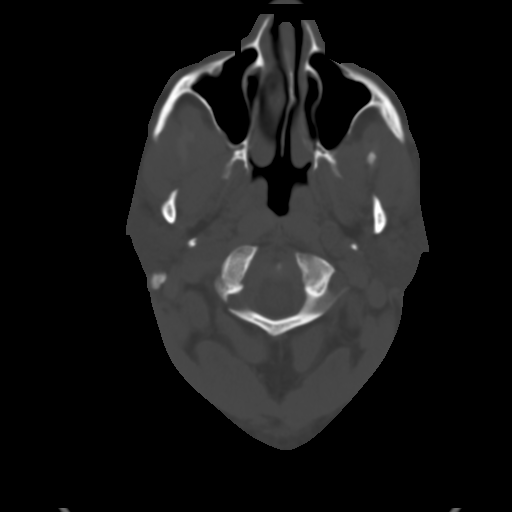
[im 4/36  brain]
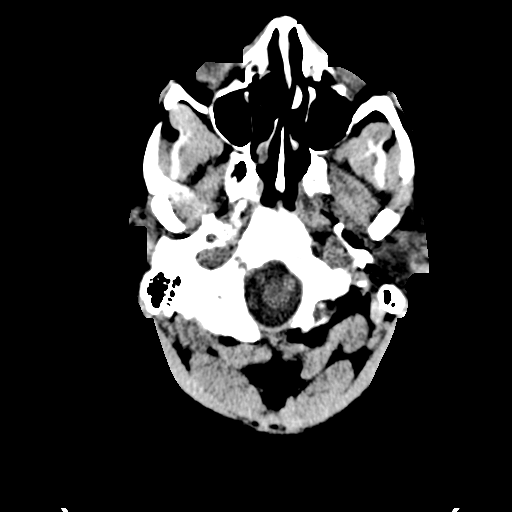
[im 7/36  brain]
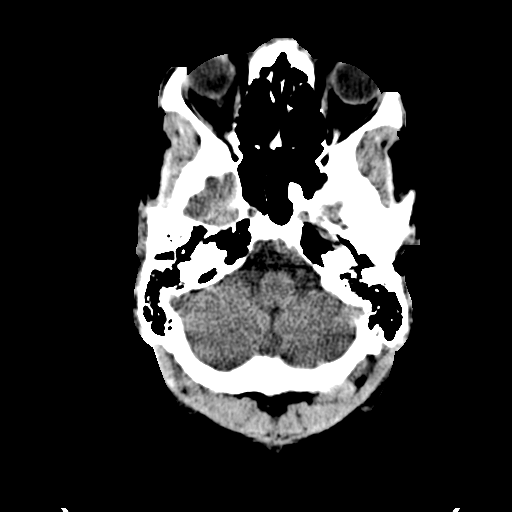
[im 9/36  brain]
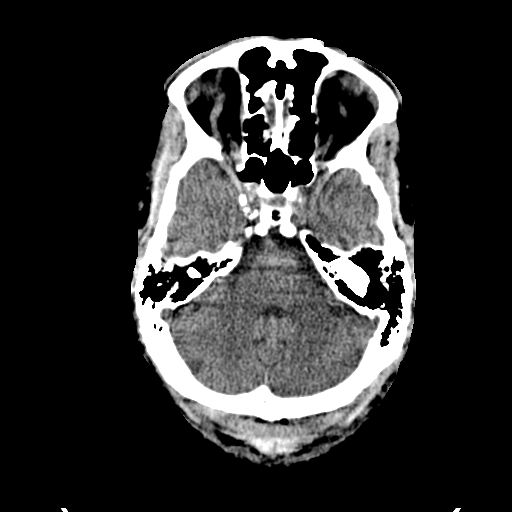
[im 10/36  brain]
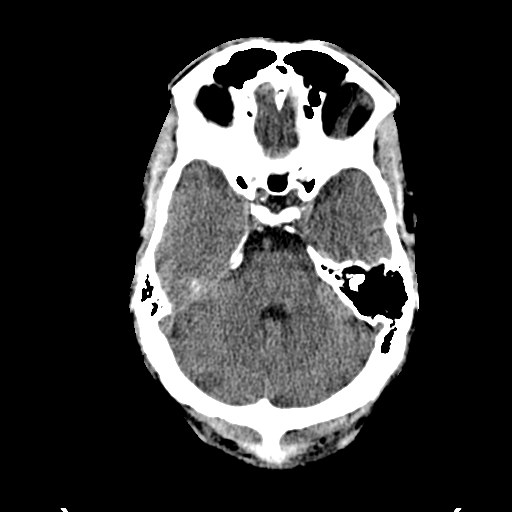
[im 10/36  bone]
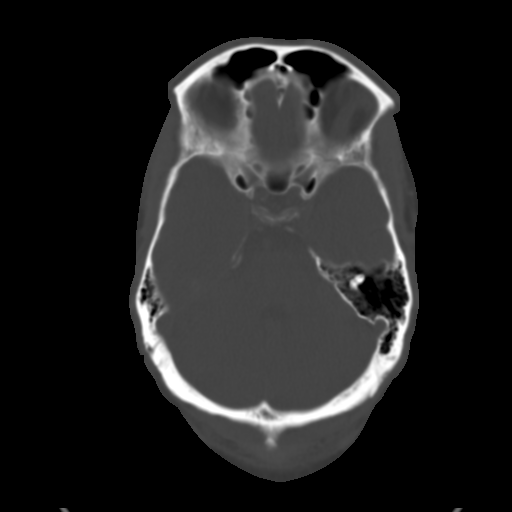
[im 13/36  brain]
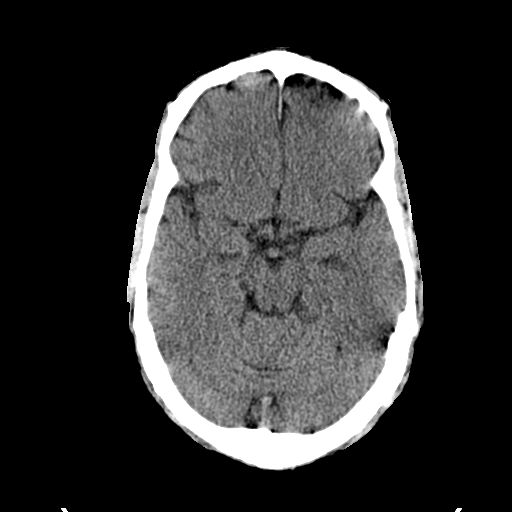
[im 15/36  brain]
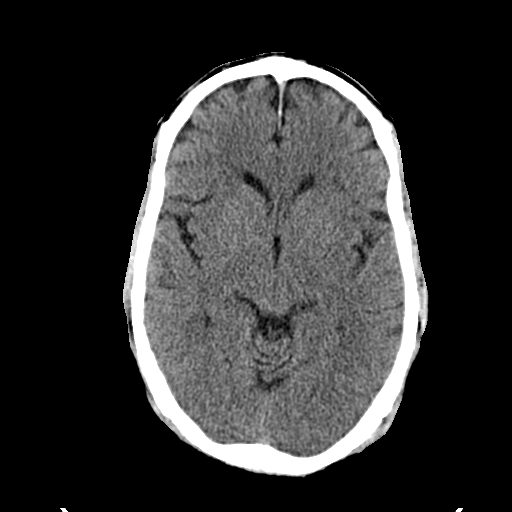
[im 17/36  brain]
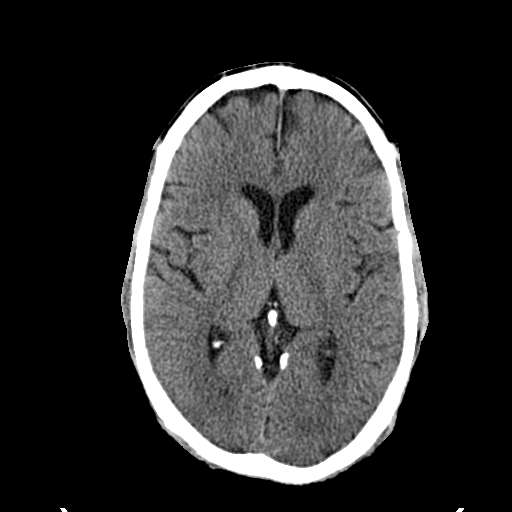
[im 19/36  brain]
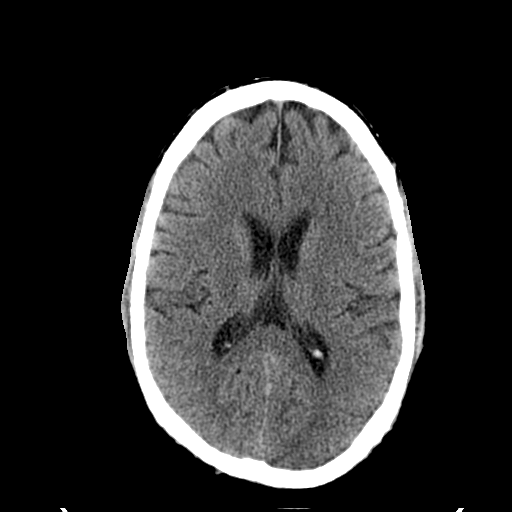
[im 19/36  bone]
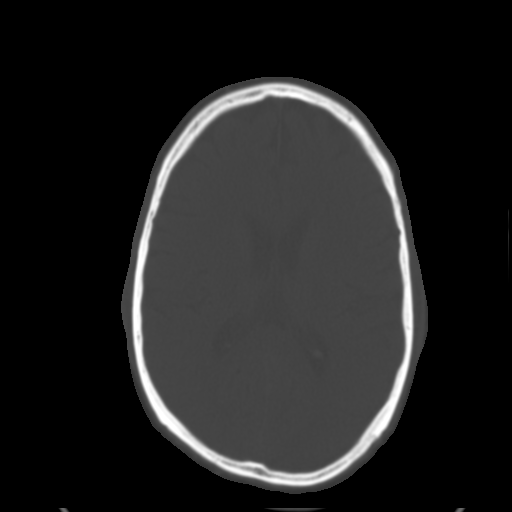
[im 21/36  brain]
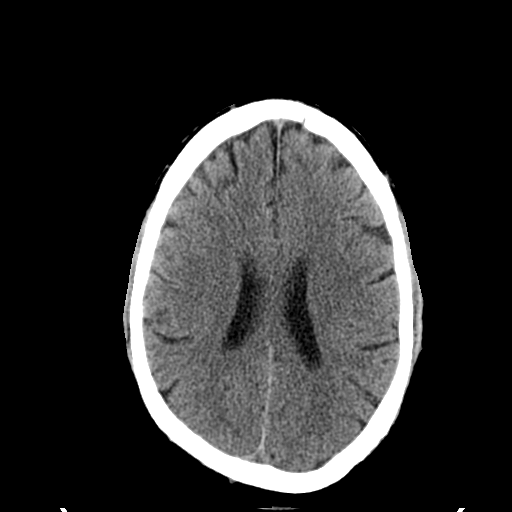
[im 23/36  brain]
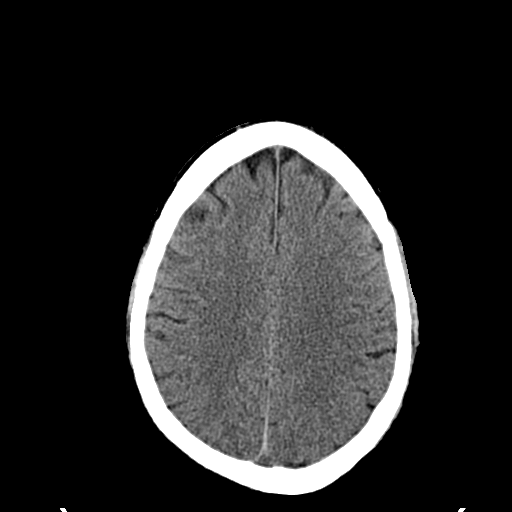
[im 26/36  brain]
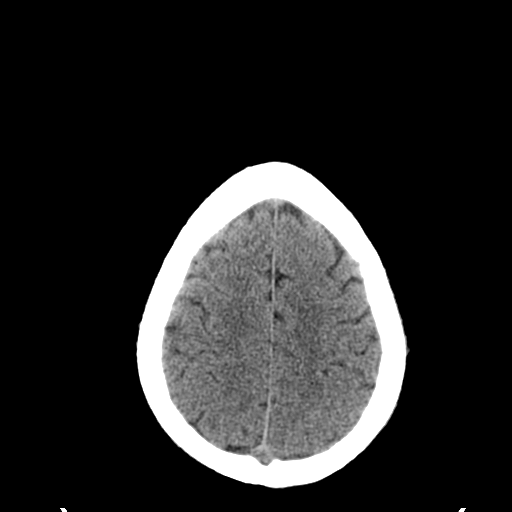
[im 27/36  brain]
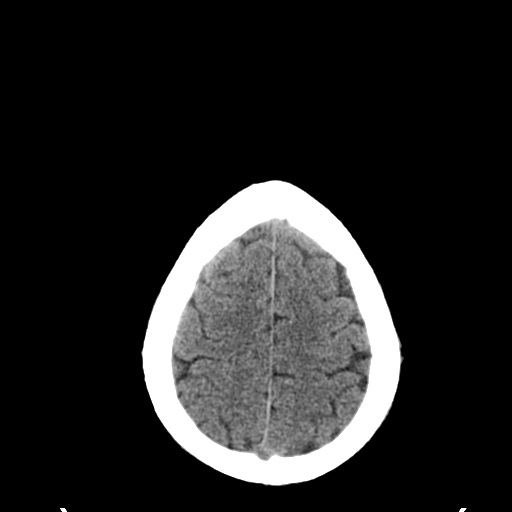
[im 27/36  bone]
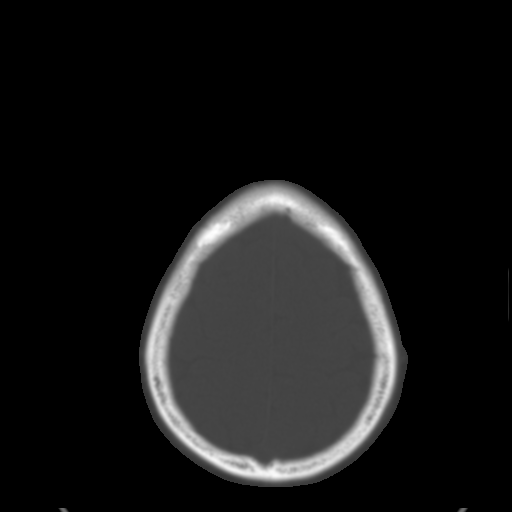
[im 29/36  brain]
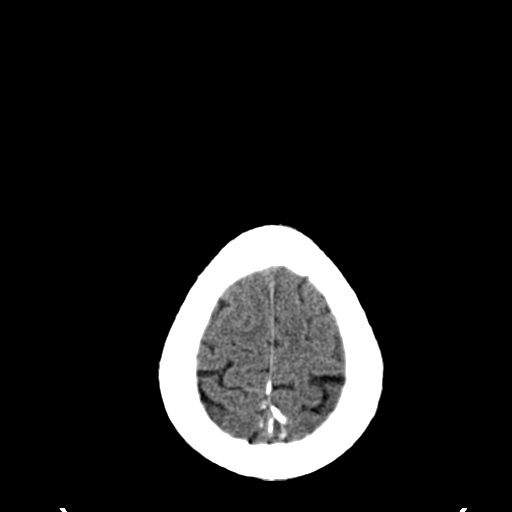
[im 32/36  brain]
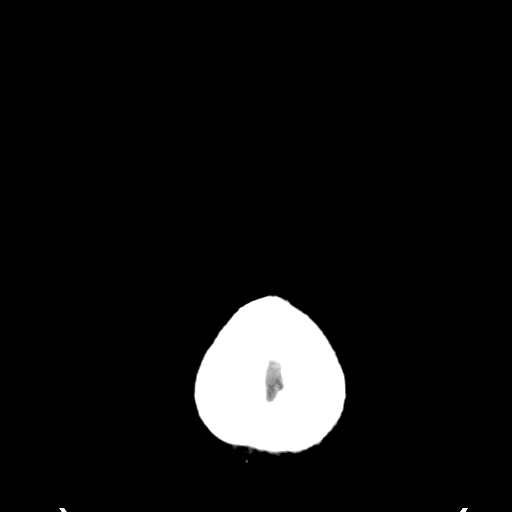
[im 34/36  brain]
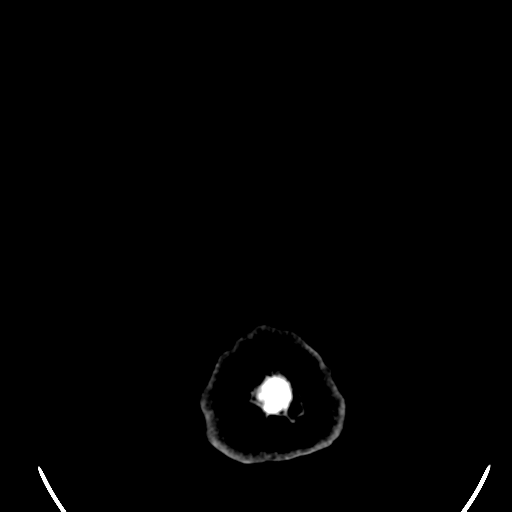

[16 of 30 positions shown; findings below may reference images not displayed]

FINDINGS: Ventricle size is normal.  Chronic infarcts  in the right
cerebellum and right occipital lobe are  unchanged.  There is no
acute infarct.  Negative for hemorrhage or mass.
IMPRESSION: No acute abnormality.

## 2009-12-04 IMAGING — CT CT CERVICAL SPINE W/O CM
3 of 4 series · 15 of 33 positions shown, 18 images · non-contrast
Comparison: None

CLINICAL DATA: Neck pain

CT CERVICAL SPINE WITHOUT CONTRAST
TECHNIQUE: Multidetector CT imaging of the cervical spine was
performed. Multiplanar CT image reconstructions were also
generated.

[Series 3: c_spine 2.0 b41s detail · axial · 0.25mm/px · z∈[-292,-156]mm · 7 of 92 slices shown, 9 images]
[im 12/92  soft-tissue]
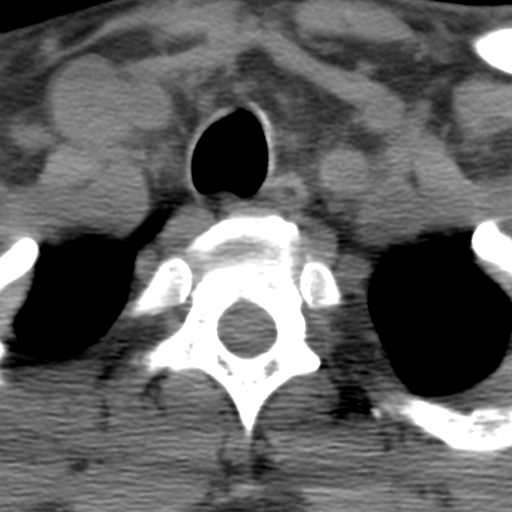
[im 12/92  bone]
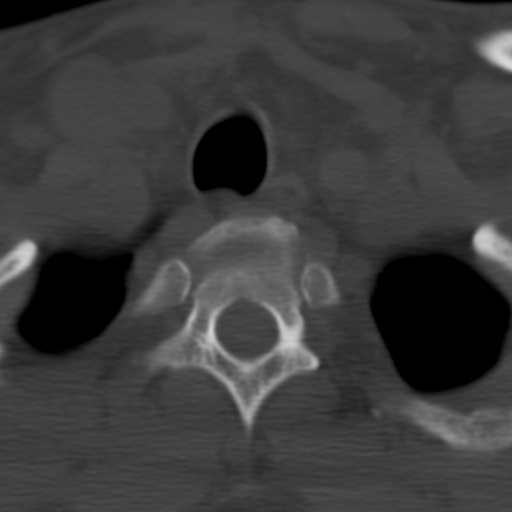
[im 23/92  bone]
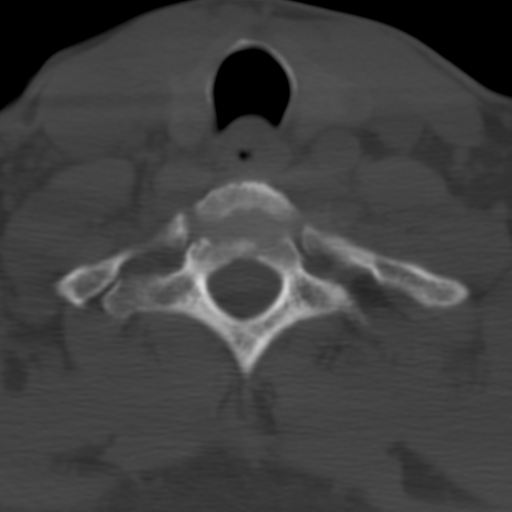
[im 35/92  bone]
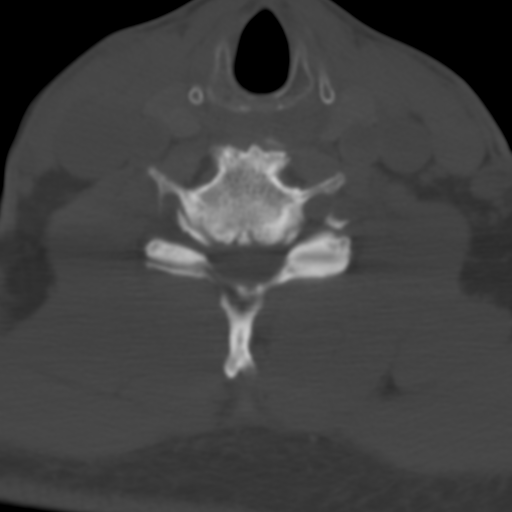
[im 46/92  bone]
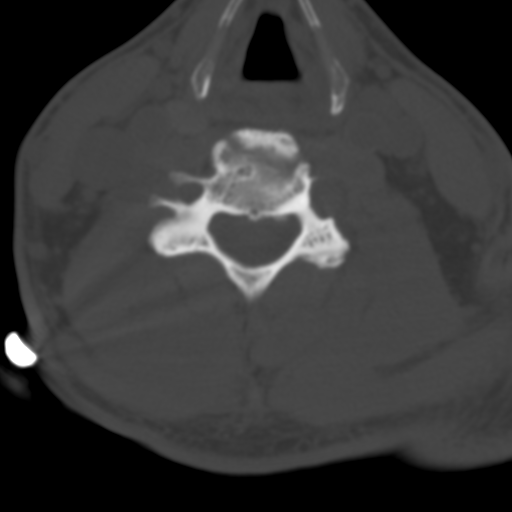
[im 57/92  soft-tissue]
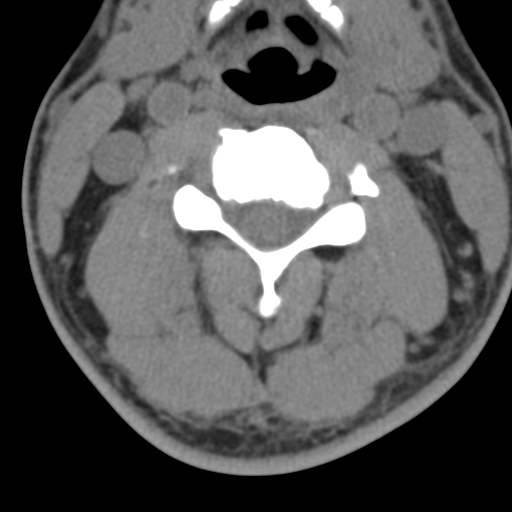
[im 57/92  bone]
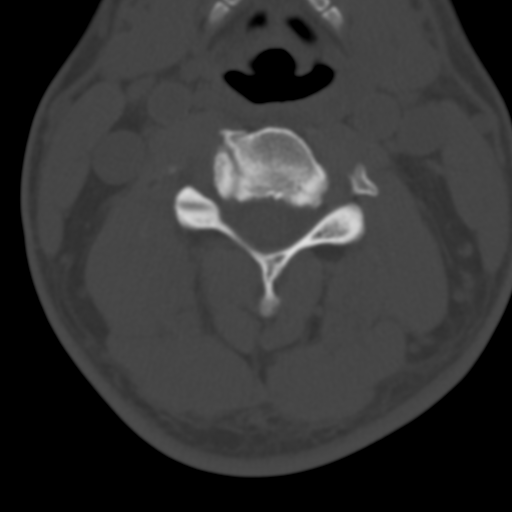
[im 69/92  bone]
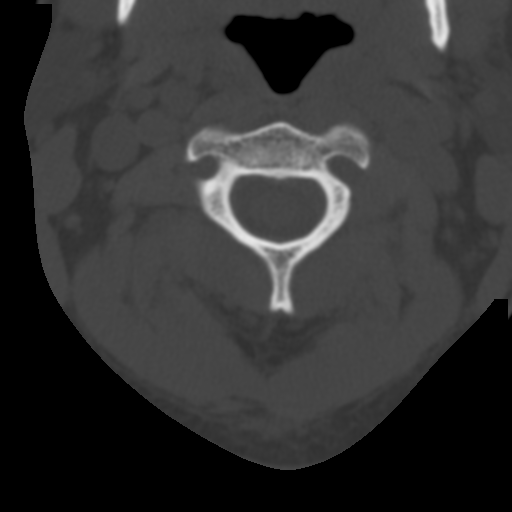
[im 80/92  bone]
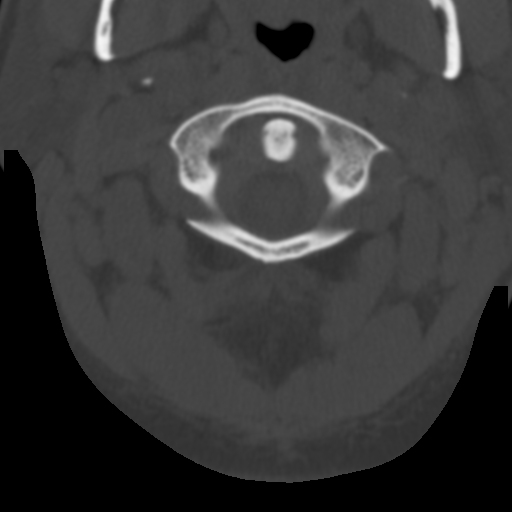

[Series 602: sag · sagittal · 0.36mm/px · 5 of 28 slices shown, 6 images]
[im 10/28  bone]
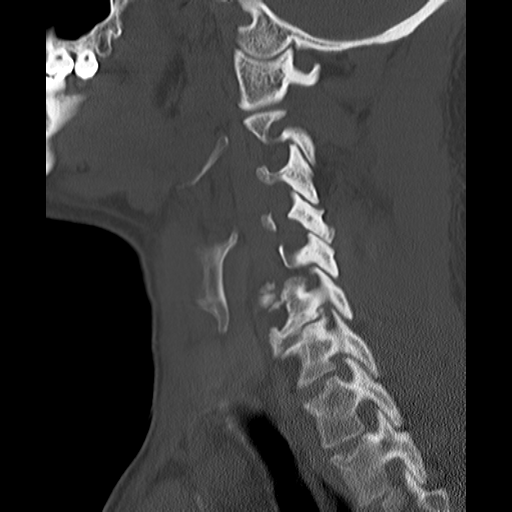
[im 12/28  bone]
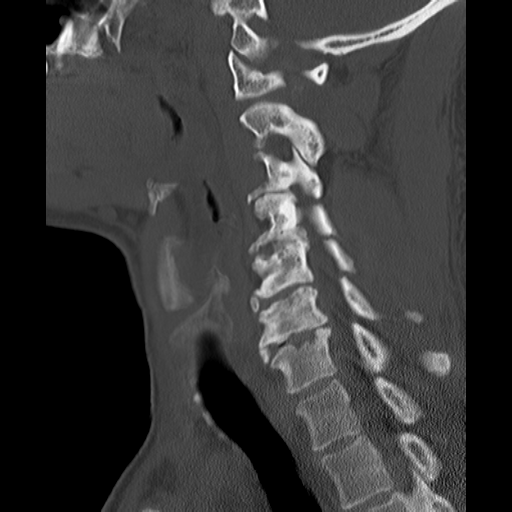
[im 14/28  soft-tissue]
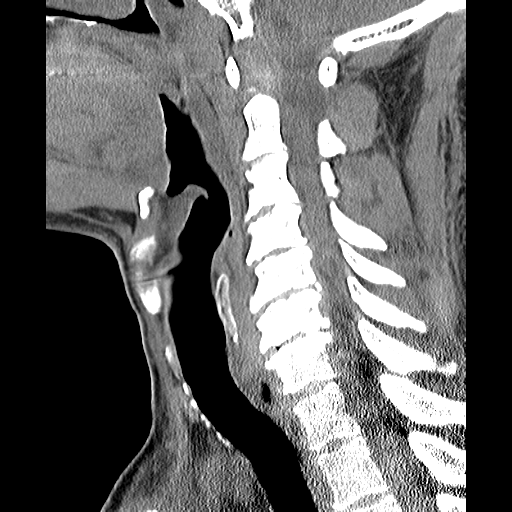
[im 14/28  bone]
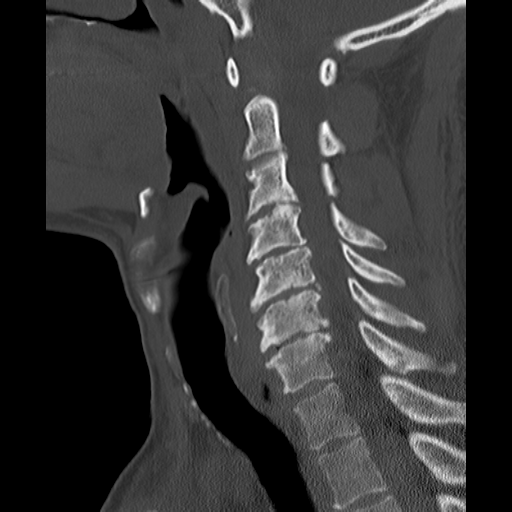
[im 16/28  bone]
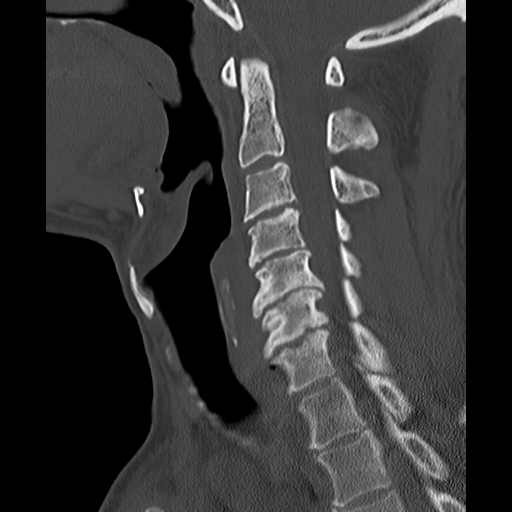
[im 19/28  bone]
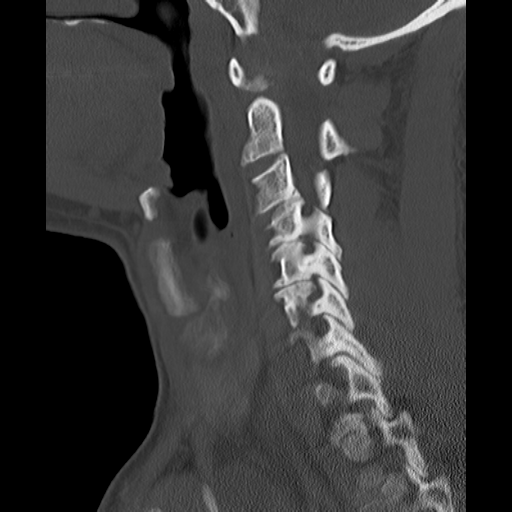

[Series 603: coronal · coronal · 0.36mm/px · 3 of 26 slices shown]
[im 6/26  bone]
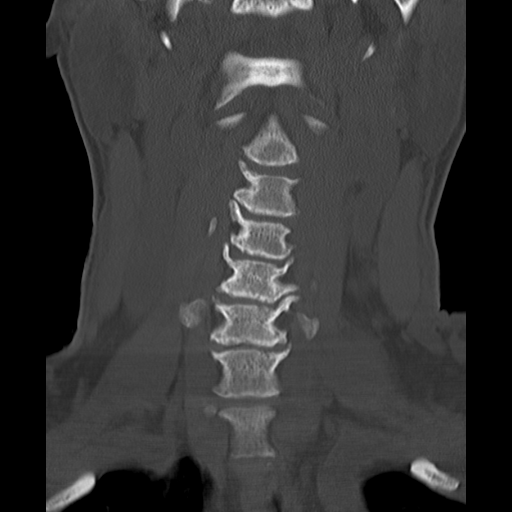
[im 11/26  bone]
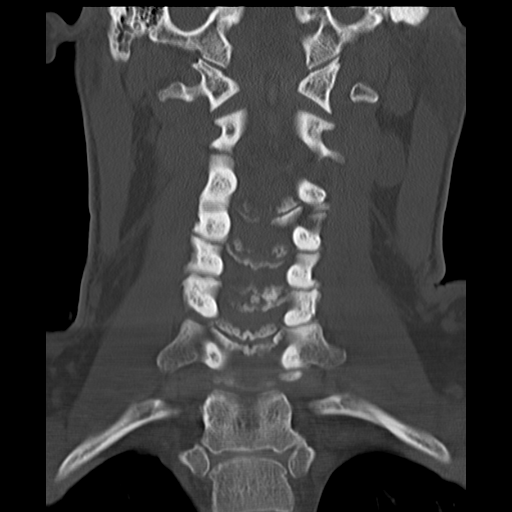
[im 16/26  bone]
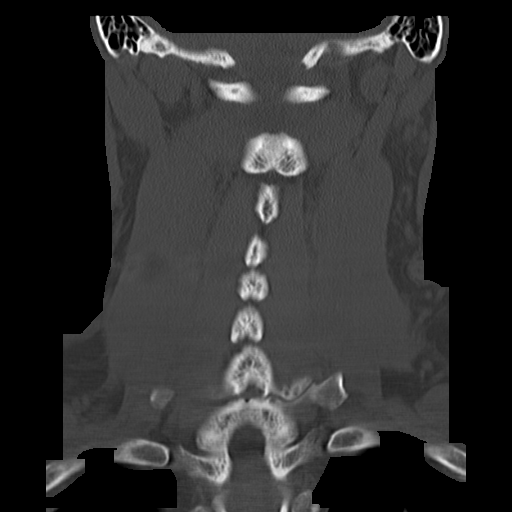

[15 of 33 positions shown; findings below may reference images not displayed]

FINDINGS: The foramen magnum is widely patent.  See zero C1 and C1-
2 levels are normal.

At C2-3, there is uncovertebral degeneration right more than left.
No significant narrowing of the canal or foramina.

At C3-4, there is spondylosis with disc space narrowing and
endplate osteophytes.  The central canal sufficiently patent.
There is osteophytic encroachment upon the foramina bilaterally
that could effect the C4 nerve roots on either side.

At C4-5, there is spondylosis with disc space narrowing and
osteophyte formation.  This encroaches upon the ventral aspect of
the canal but probably does not compress the cord.  There is
foraminal encroachment by osteophytes, worse on the right than the
left.  There is mild facet degeneration at this level.

At C5-6, there is degenerative spondylosis with this space
narrowing and prominent osteophytes.  These osteophytes encroach
upon the canal and could indent the cord.  There is foraminal
encroachment, left worse than right.  There is facet degeneration
bilaterally.  Either C6 nerve root could be affected.

At C6-7, there is pronounced chronic spondylosis with this space
narrowing and endplate osteophytes.  Osteophytes encroach upon the
canal and could effect the cord.  There is neural foraminal
stenosis bilaterally that could effect either C7 nerve root.  There
is mild facet degeneration as well.

At C7-T1, there is facet degeneration left worse than right.
Central canal appears sufficiently patent.  There is foraminal
encroachment, worse on the left than the right because of the facet
osteophytes.

Mild facet degeneration noted at T1-2 and T2-3 without
encroachment.

No calcific disease is seen at either carotid bifurcation.  The
patient has a low density lesion of the left lobe of the thyroid
measuring 15 mm with a central calcification.  This is probably
benign, but I would suggest ultrasound for further evaluation.
Lung apices are clear.
IMPRESSION: Advanced cervical degenerative changes with spondylosis from C3-4
through C6-7.  The canal is narrowed in that region and there is
osteophytic encroachment upon the foramina.  The patient has
potential for symptoms related to canal stenosis as well as
multilevel foraminal stenosis.

Low density 15 mm abnormality of the left lobe of the thyroid with
a central calcification.  This is probably benign, but I would
suggest ultrasound for evaluation.

## 2009-12-04 IMAGING — CR DG CHEST 2V
1 series · 1 of 1 positions shown · non-contrast
Comparison: 07/01/2009 and 06/26/2009.

CLINICAL DATA: Chest and right leg pain.  Recent surgery for aortic
aneurysm.

CHEST - 2 VIEW

[view not recorded]
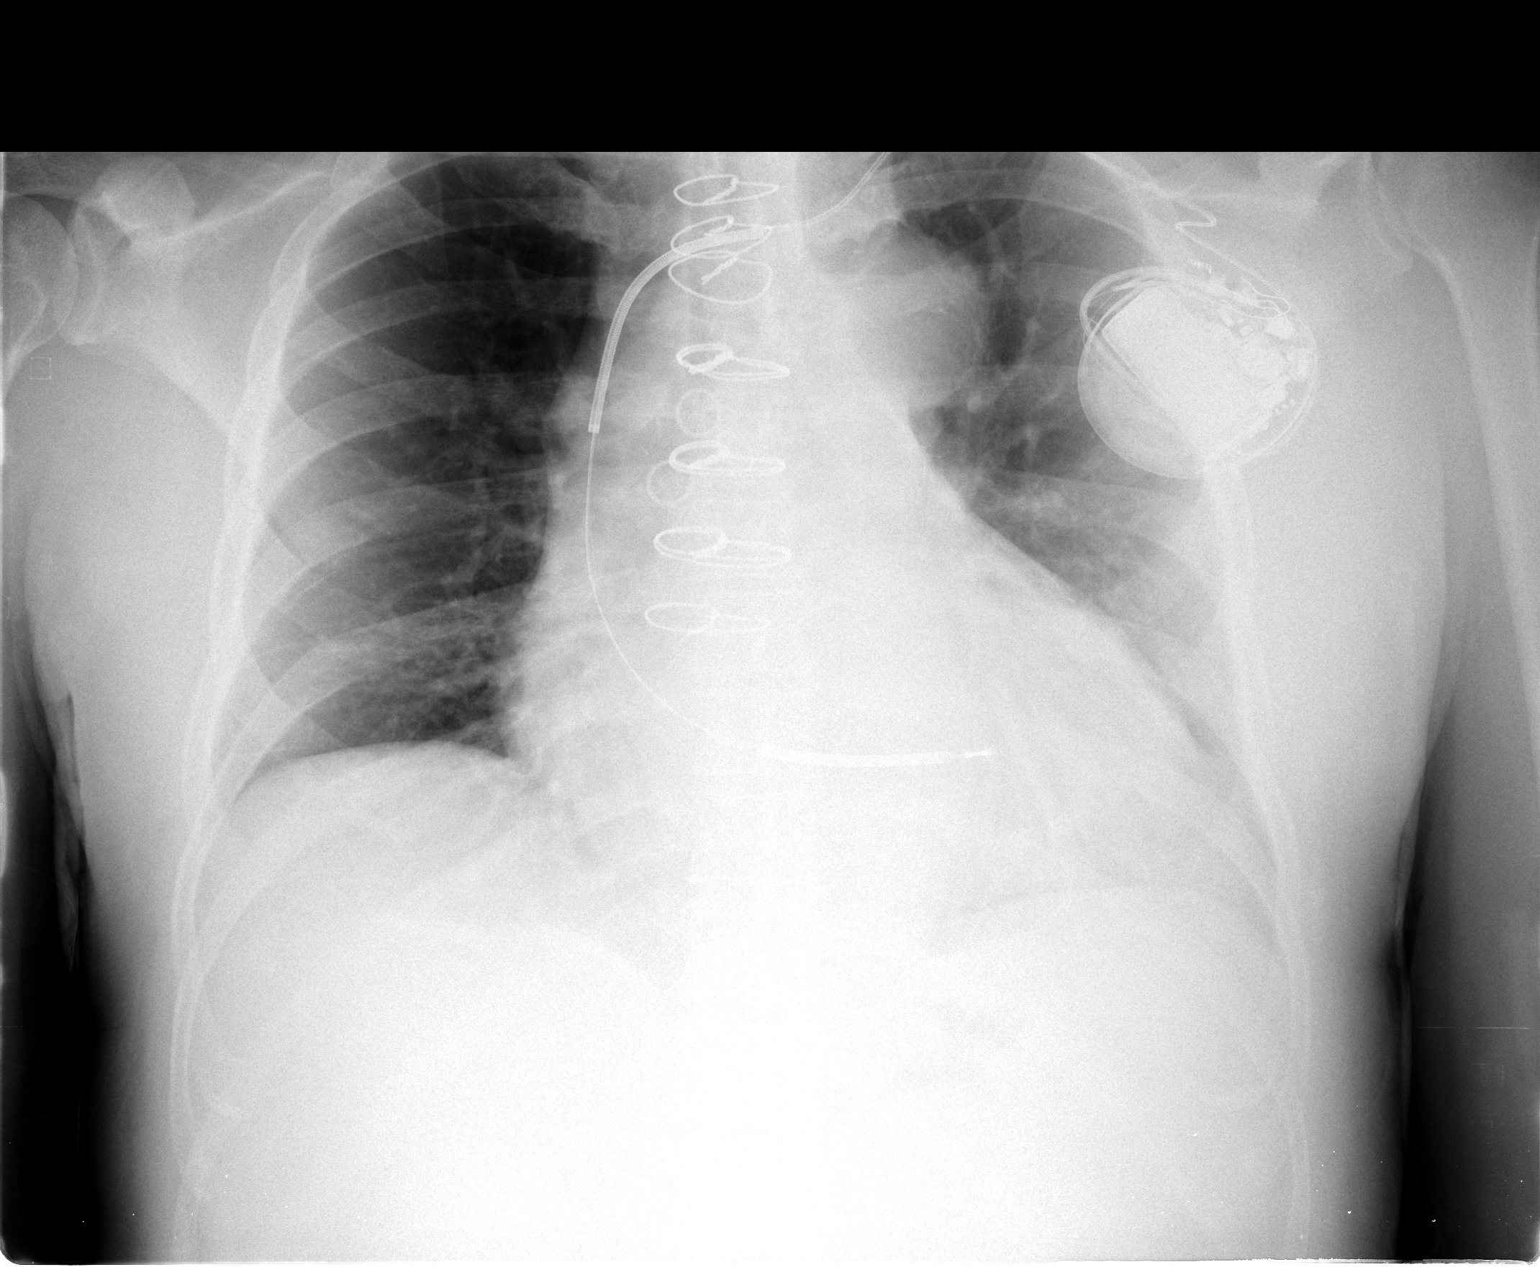

[1 of 1 positions shown; findings below may reference images not displayed]

FINDINGS: Left subclavian AICD lead appears stable.  There is
stable mild cardiac enlargement and aortic tortuosity status post
CABG.  Mild atelectasis remains at the left lung base.  The right
basilar aeration has improved.  There is no edema, pleural effusion
or pneumothorax.
IMPRESSION: Improved right basilar aeration.  No acute chest findings.

## 2009-12-07 IMAGING — CT CT L SPINE W/ CM
2 of 16 series · 6 of 33 positions shown, 7 images · IV contrast (omnipaque)
Comparison: None
COMPARISON: None
COMPARISON: None.
COMPARISON: No
COMPARISON: None.

CLINICAL DATA: Neck pain and back pain.  Prior back surgery.

INTRATHECAL INJECTION OF CONTRAST FOR MYELOGRAM
TECHNIQUE: Informed consent was obtained from the patient prior to
the procedure, including potential complications of headache,
allergy, and pain.   With the patient prone, the cervical area of
the back was prepped with Betadine.  1% Lidocaine was used for
local anesthesia.  Cervical puncture was performed at the L3-4
level using a 22 gauge needle with return of clearCSF.  9 ml   of
4mnipaque-CBB was injected into the subarachnoid space.  The
patient tolerated the procedure well without apparent complication.
Fluoroscopy Time: 2.3 minutes.
Contrast: 19 ml 4mnipaque-CBB
CLINICAL DATA: Neck pain.
MYELOGRAM CERVICAL
TECHNIQUE: Following injection of intrathecal Omnipaque contrast,
spine imaging in multiple projections was performed using
fluoroscopy.
CT MYELOGRAPHY CERVICAL SPINE
TECHNIQUE: CT imaging of the cervical spine was performed after
intrathecal contrast administration. Multiplanar CT image
reconstructions were also generated.
CLINICAL DATA: Back pain
MYELOGRAM LUMBAR
CLINICAL DATA: Back pain.
CT MYELOGRAPHY LUMBAR SPINE
TECHNIQUE: CT imaging of the lumbar spine was performed after
intrathecal contrast administration.  Multiplanar CT image

[Series 113: l-spn bone · axial · 0.29mm/px · z∈[-671,-454]mm · 3 of 108 slices shown, 4 images]
[im 1/108  soft-tissue]
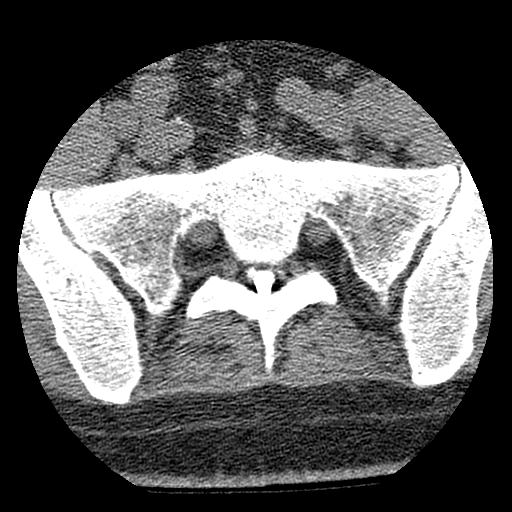
[im 1/108  bone]
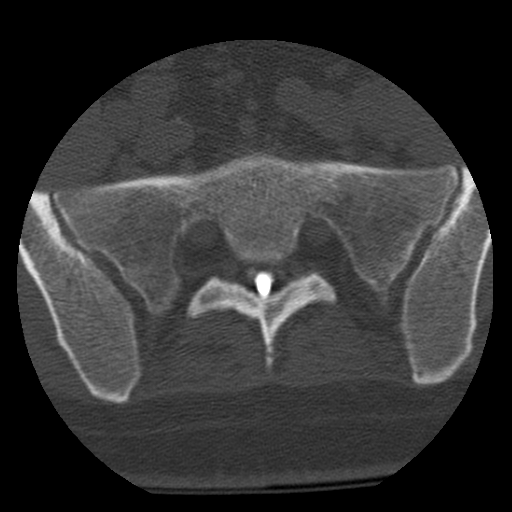
[im 54/108  bone]
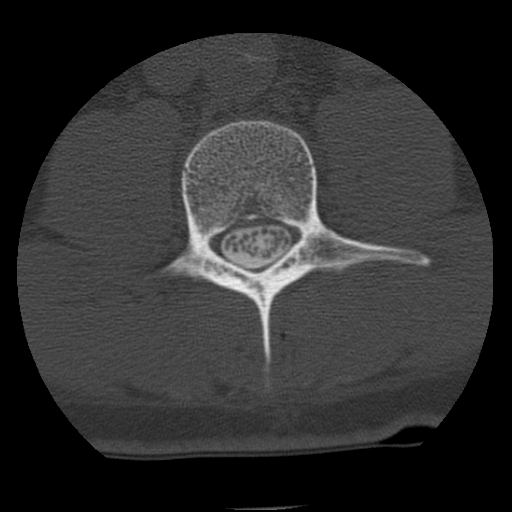
[im 108/108  bone]
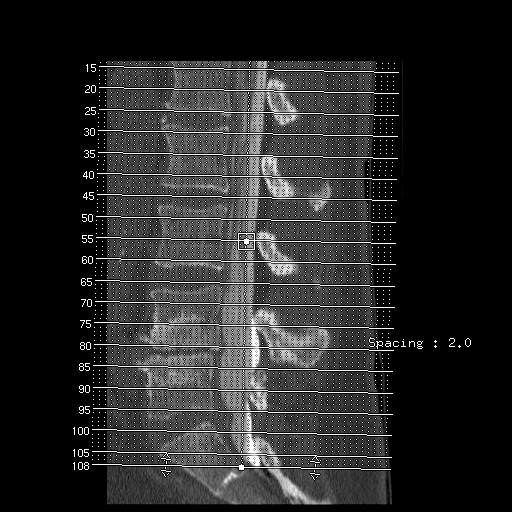

[Series 701: l-spn bone cor · coronal · 0.47mm/px · 3 of 59 slices shown]
[im 12/59  bone]
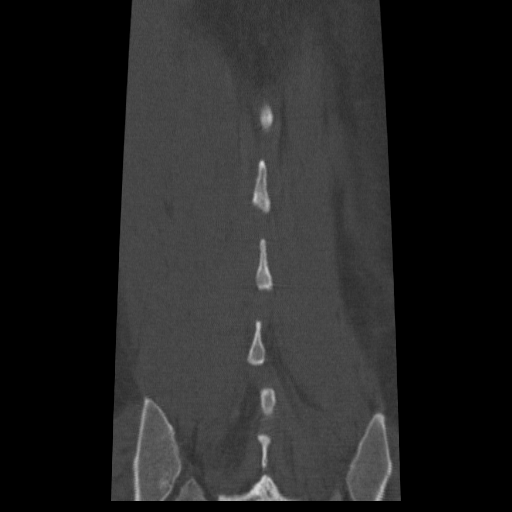
[im 24/59  bone]
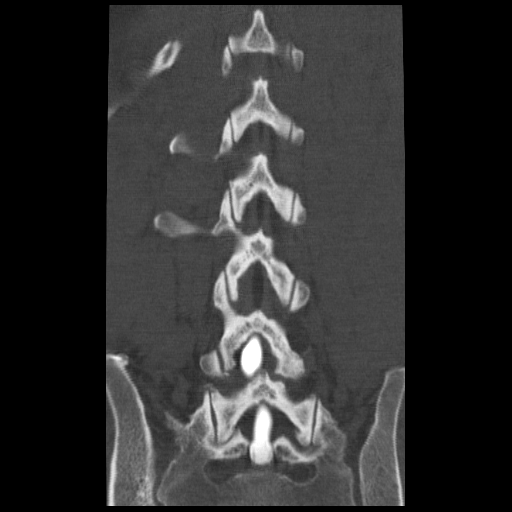
[im 35/59  bone]
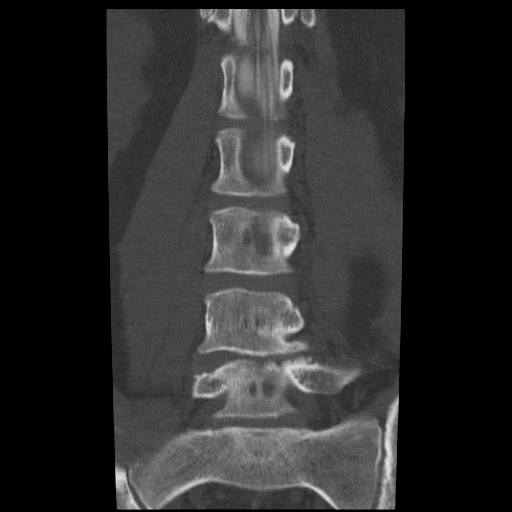

[6 of 33 positions shown; findings below may reference images not displayed]

FINDINGS: Satisfactory subarachnoid injection of contrast for
myelography.
IMPRESSION: Satisfactory subarachnoid injection of contrast for myelography.
See below report.
FINDINGS: Normal cervical alignment.  There is moderate disc
degeneration and spurring at C3-4 and C4-5.  There is moderate to
extensive spondylosis at C5-6 and C6-7 with spinal stenosis at C5-6
and C6-7 due to diffuse uncovertebral spurring.  There is mass
effect on the nerve root sleeves bilaterally at C4-5 and C5-6, and
C6-7.  There is diffuse facet degeneration.
IMPRESSION: Cervical spondylosis and facet degeneration which is moderate to
advanced.
FINDINGS: Normal cervical alignment.  No fracture or mass lesion
is seen.  There is advanced multilevel disc degeneration and
spondylosis.

C2-3:  Disc degeneration and mild spurring.  There is no cord
deformity.

C3-4:  Moderate disc degeneration and spondylosis.  There is
diffuse bilateral uncovertebral spurring with mild foraminal
narrowing bilaterally.  There is no cord deformity.

C4-5:  Moderate to advanced disc degeneration and spondylosis.
There is diffuse uncovertebral spurring and mild facet degeneration
bilaterally.  There is moderate foraminal narrowing bilaterally and
mild to moderate spinal stenosis with effacement of the
subarachnoid space and mild deformity of the cord.

C5-6:  Moderate to advanced disc degeneration and spondylosis.
Spurring is greater on the left than  the right and there is marked
left foraminal encroachment.  There is bilateral facet degeneration
and moderate spinal stenosis.

C6-7:  Disc degeneration with diffuse uncovertebral spurring.
There is moderate foraminal narrowing bilaterally mild central
canal stenosis.

C7-T1:  Mild disc and facet degeneration without stenosis.
IMPRESSION: Multilevel advanced spondylosis.  There is moderate spinal stenosis
at C4-5 and C5-6 and mild stenosis at C6-7.  There is marked left
foraminal encroachment at C5-6 due to spurring as well as moderate
foraminal narrowing bilaterally at C4-5 and C6-7.
FINDINGS: None normal lumbar alignment.  No fracture is identified.
There is moderate to advanced disc degeneration at L4-5 without
spinal stenosis.  There is a moderately large anterior extra dural
defect at L5-S1 which may be due to a disc protrusion.  This is
causing spinal stenosis.  There is cutoff of the L5 nerve root
bilaterally.  There has been prior laminectomy at L4-5.
IMPRESSION: Moderately large anterior extra defect at L5-S1, compatible with
disc protrusion.

Laminectomy and disc degeneration at L4-5.
FINDINGS: Normal lumbar alignment.  There is no fracture.  There
is extensive sclerosis at L4-5 centrally to the left related to
disc degeneration and prior surgery.

T12-L1:  Small central disc protrusion without significant spinal
stenosis.

L1-2:  Small central disc protrusion.

L2-3:  Diffuse disc bulging and a small central disc protrusion.
No significant spinal stenosis.

L3-4:  Diffuse disc bulging without significant spinal stenosis.

L4-5:  Postop laminectomy on the right.  There is extensive
vertebral sclerosis centrally into the left related to disc
degeneration and  prior laminectomy. There is diffuse disc bulging.
There is narrowing of the right lateral recess due to overgrowth of
the facet and disc bulging.  This appears be causing right L5 nerve
root impingement.  There is mild narrowing of the left lateral
recess.  Central canal is decompressed.

L5-1:  Moderate to large diffuse disc protrusion.  This is
bilateral but more prominent on the right the left.  There is mass
effect on the thecal sac.  There is mild facet degeneration.
IMPRESSION: Postop laminectomy on the right at L4-5.  There is impingement of
the right L5 nerve root due to disc bulging and overgrowth of the
bone.

Moderate to large disc protrusion at L5-S1, more prominent on the
right than the left.  There is expected right S1 nerve root
impingement.

## 2009-12-10 IMAGING — CR DG LUMBAR SPINE 1V
1 series · 1 of 1 positions shown · non-contrast
Comparison: Lumbar myelogram CT 07/20/2009.

CLINICAL DATA: L5-S1 microdiskectomy.

LUMBAR SPINE - 1 VIEW

[view not recorded]
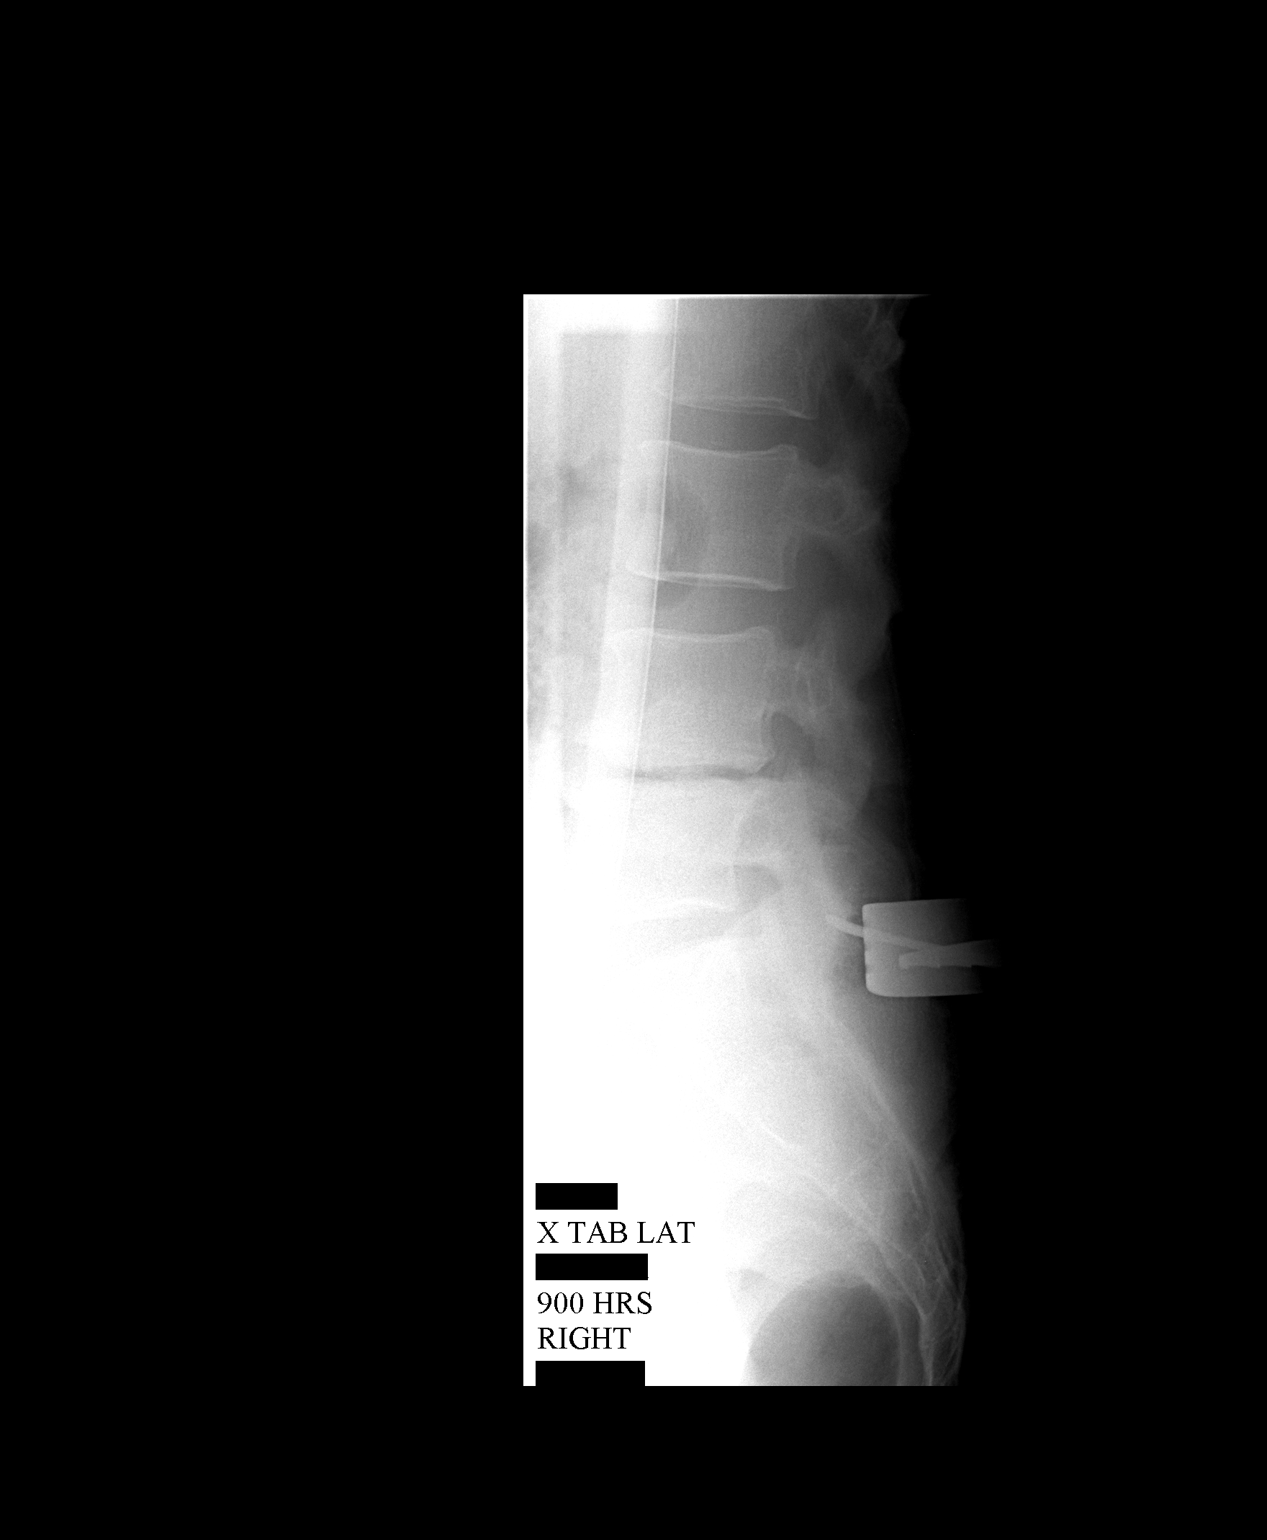

[1 of 1 positions shown; findings below may reference images not displayed]

FINDINGS: Single cross-table lateral view labeled #1 at 0000 hours
demonstrates skin spreaders posteriorly at L5-S1.  There is a blunt
surgical instrument directed towards the posterior aspect of the L5-
S1 disc space.
IMPRESSION: L5-S1 localization.

## 2010-01-25 ENCOUNTER — Ambulatory Visit: Payer: Self-pay | Admitting: Internal Medicine

## 2010-01-25 DIAGNOSIS — N259 Disorder resulting from impaired renal tubular function, unspecified: Secondary | ICD-10-CM | POA: Insufficient documentation

## 2010-01-25 DIAGNOSIS — R0989 Other specified symptoms and signs involving the circulatory and respiratory systems: Secondary | ICD-10-CM | POA: Insufficient documentation

## 2010-02-08 ENCOUNTER — Encounter: Admission: RE | Admit: 2010-02-08 | Discharge: 2010-04-11 | Payer: Self-pay | Admitting: Neurosurgery

## 2010-02-14 IMAGING — CT CT L SPINE W/ CM
4 of 11 series · 11 of 33 positions shown, 12 images · IV contrast (omnipaque)
Comparison: Most recent myelogram and CT 07/20/2009

MYELOGRAM  INJECTION
TECHNIQUE: Informed consent was obtained from the patient prior to
the procedure, including potential complications of headache,
allergy, infection and pain. Specific instructions were given
regarding 24 hour bedrest post procedure to prevent post-LP
headache.  A timeout procedure was performed.  With the patient
prone, the lower back was prepped with Betadine.  1% Lidocaine was
used for local anesthesia.  Lumbar puncture was performed by the
radiologist at the L2-3 level using a 22 gauge needle with return
of clear CSF.  15 cc of Omnipaque 180 was injected into the
subarachnoid space .
CLINICAL DATA: Low back with bilateral leg pain.  Previous lumbar
surgery.
TECHNIQUE: Multidetector CT imaging of the lumbar spine was
performed following myelography.  Multiplanar CT image
reconstructions were also generated.

[Series 2: l-spine helical · axial · 0.27mm/px · z∈[+69,+132]mm · 2 of 75 slices shown]
[im 25/75  bone]
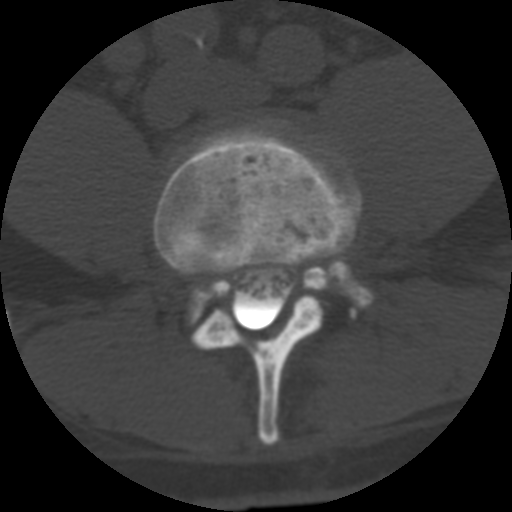
[im 50/75  bone]
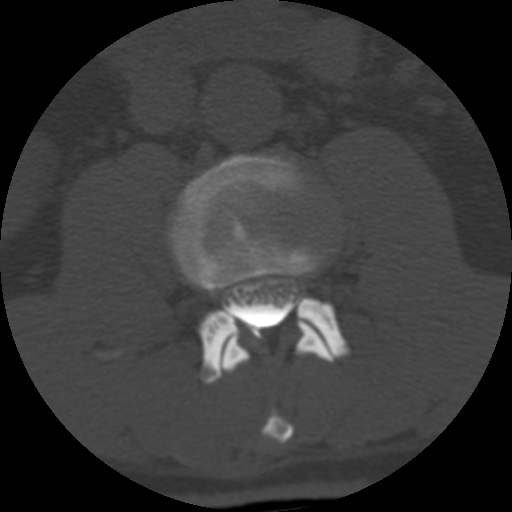

[Series 3: bone windows · axial · 0.27mm/px · z∈[+54,+147]mm · 3 of 75 slices shown, 4 images]
[im 19/75  soft-tissue]
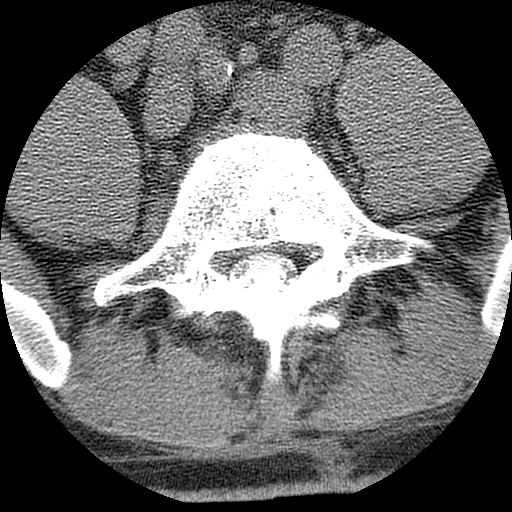
[im 19/75  bone]
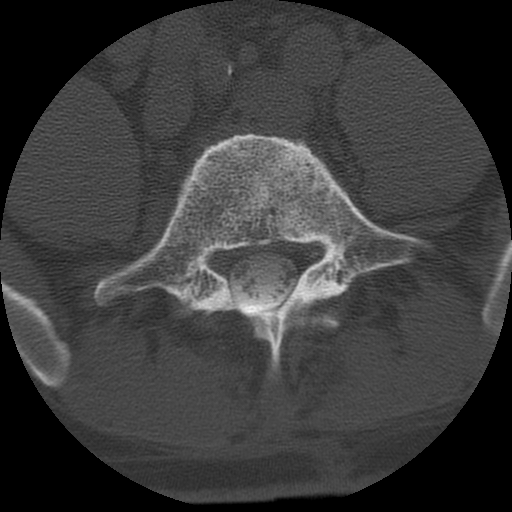
[im 38/75  bone]
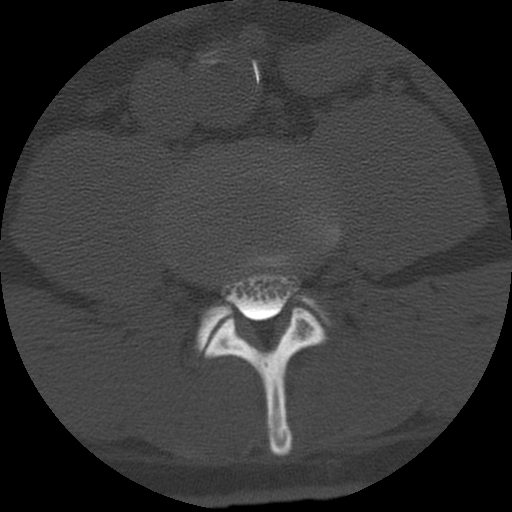
[im 56/75  bone]
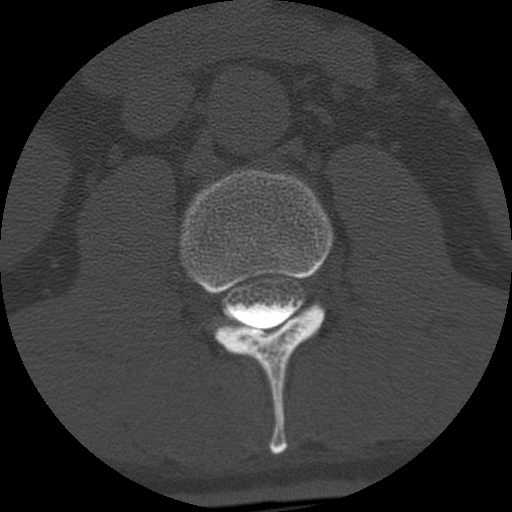

[Series 400: sagittal · coronal · 0.37mm/px · 1 of 36 slices shown (1 of 2)]
[im 18/36  bone]
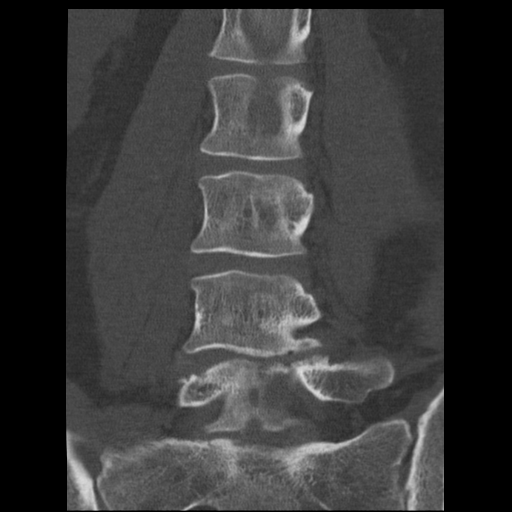

[Series 400: sagittal · sagittal · 0.37mm/px · 5 of 40 slices shown (2 of 2)]
[im 7/40  bone]
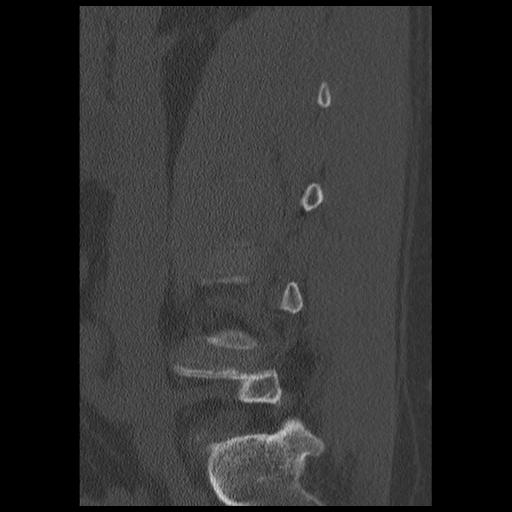
[im 14/40  bone]
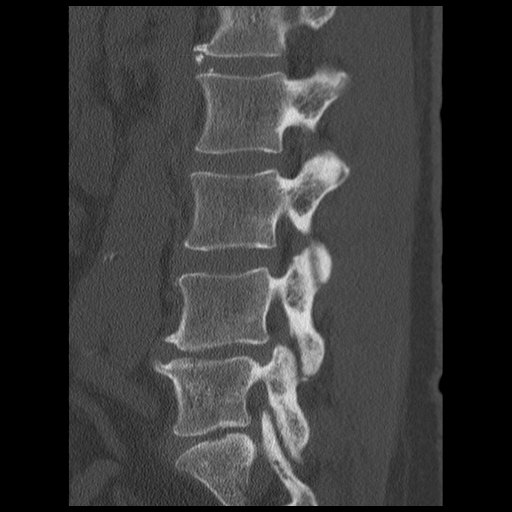
[im 20/40  bone]
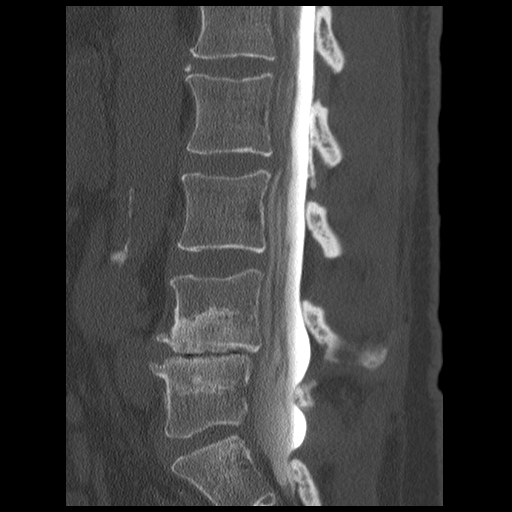
[im 27/40  bone]
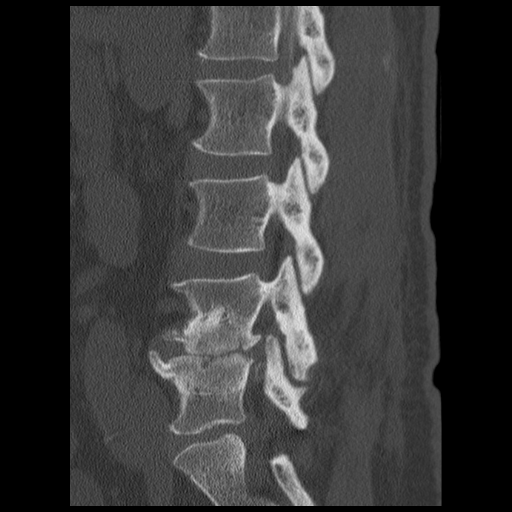
[im 33/40  bone]
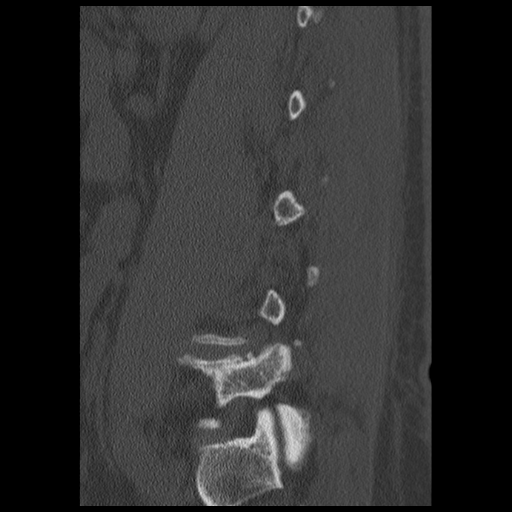

[11 of 33 positions shown; findings below may reference images not displayed]

IMPRESSION: Successful injection of  intrathecal contrast for myelography.

MYELOGRAM LUMBAR
FINDINGS: Severe disc space narrowing L4-5, left greater than
right.  Anatomic alignment except for minimal retrolisthesis of 2-
3 mm L4 on L5.  No dynamic instability.  Left S1 nerve root
truncation.  Bilateral L5 nerve root effacement.  Shallow
dorsolateral defect at L5-S1 on the right, likely facet mediated.

Fluoroscopy Time: 1.30 minutes
IMPRESSION: As above.

CT MYELOGRAPHY LUMBAR SPINE
FINDINGS: No prevertebral or paraspinous masses.  Early
atherosclerotic calcification of the aorta.  The

L1-2: Normal.

L2-3: Normal.

L3-4: Normal.

L4-5: Previous surgery with  laminotomy defect on the right.
Severe disc space narrowing, left greater than right.  Vacuum disc
phenomenon with endplate sclerotic changes.  Buttressing
osteophytes project laterally, left greater than right.  Minimal
retrolisthesis L4 on L5.  Mild to moderate facet arthropathy.
Right greater than left L5 nerve root encroachment.  Bilateral
neural foraminal narrowing, multifactorial, affects the L4 nerve
roots, worse on the left. No evidence for central canal stenosis.

L5-S1: Improved appearance status post recent L5-S1 discectomy on
the right.  Shallow central and leftward protrusion displaces the
left S1 nerve root.  Mild to moderate facet arthropathy.  Mild
bilateral neural foraminal narrowing.

Compared with June 2009 the appearance at L5-S1 is much improved.
The appearance at L4-5 is not appreciably changed.
IMPRESSION: Improved appearance L5-S1, right,   status post discectomy.

Shallow central and leftward protrusion at L5-S1 displaces the left
S1 nerve root.

Severe degenerative disc disease at L4-5 with disc space narrowing,
minimal retrolisthesis, osteophyte formation, and facet
arthropathy, as well as central bulging annular fibers affect the
L4 and L5 nerve roots; this appearance is stable from the [facility]
study

## 2010-03-09 ENCOUNTER — Ambulatory Visit: Payer: Self-pay | Admitting: Internal Medicine

## 2010-03-16 ENCOUNTER — Ambulatory Visit: Payer: Self-pay

## 2010-03-16 ENCOUNTER — Encounter: Payer: Self-pay | Admitting: Internal Medicine

## 2010-03-24 ENCOUNTER — Ambulatory Visit: Payer: Self-pay | Admitting: Internal Medicine

## 2010-04-01 ENCOUNTER — Telehealth: Payer: Self-pay | Admitting: Internal Medicine

## 2010-04-10 IMAGING — CR DG CHEST 2V
2 series · 2 of 2 positions shown · non-contrast
Comparison: 08/31/2009

CLINICAL DATA: Chest pain and shortness of breath.  Coronary artery
disease.

CHEST - 2 VIEW

[w chest pa]
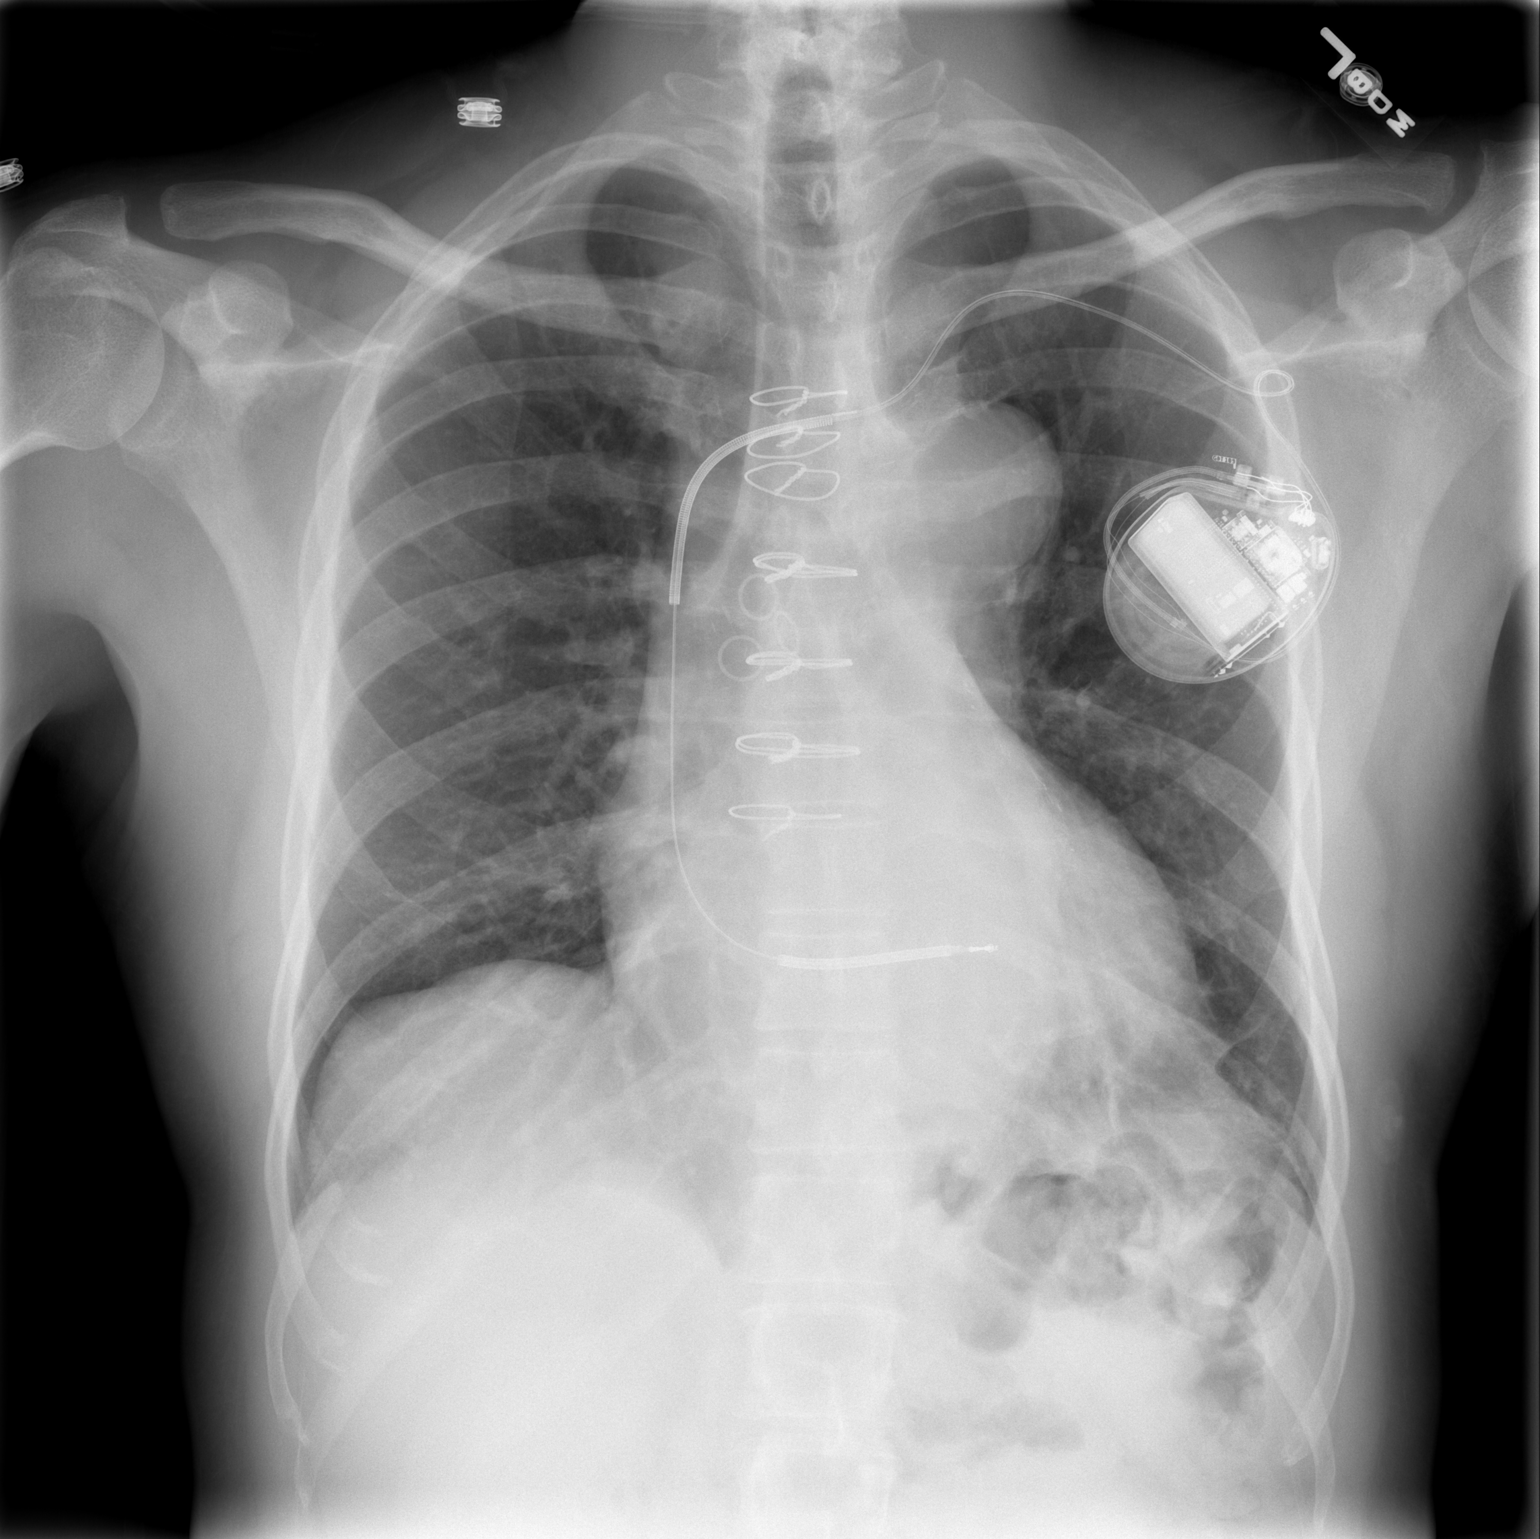

[w chest lat]
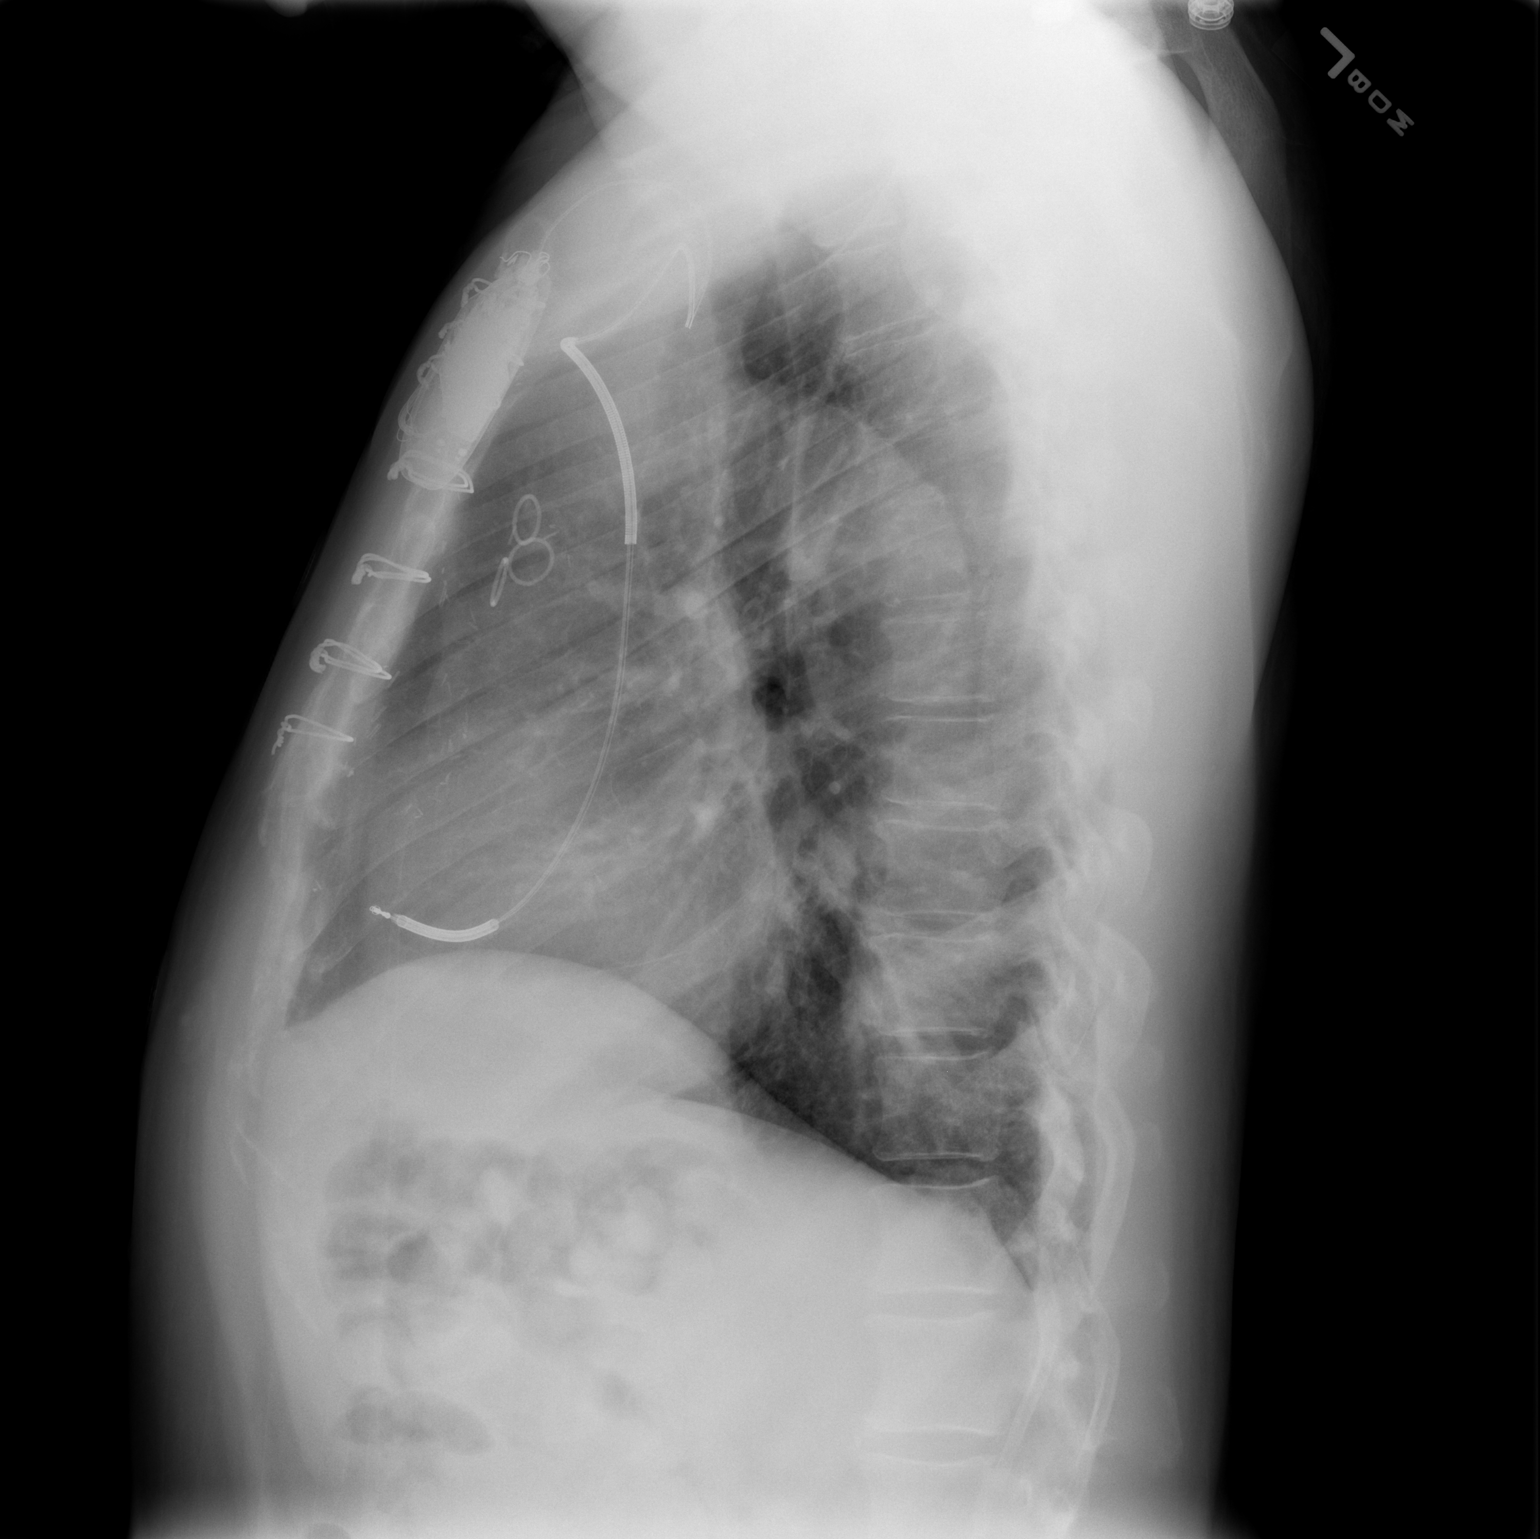

[2 of 2 positions shown; findings below may reference images not displayed]

FINDINGS: Mild cardiomegaly and ectasia of the thoracic aorta
remains stable.  Both lungs are clear.  No evidence of pleural
effusion.  AICD remains in appropriate position.  Prior coronary
bypass grafting noted.
IMPRESSION: Stable cardiomegaly.  No active lung disease.

## 2010-04-12 ENCOUNTER — Other Ambulatory Visit: Admission: RE | Admit: 2010-04-12 | Discharge: 2010-04-12 | Payer: Self-pay | Admitting: Diagnostic Radiology

## 2010-04-12 ENCOUNTER — Encounter: Payer: Self-pay | Admitting: Internal Medicine

## 2010-04-12 ENCOUNTER — Telehealth: Payer: Self-pay | Admitting: Internal Medicine

## 2010-04-12 ENCOUNTER — Encounter: Admission: RE | Admit: 2010-04-12 | Discharge: 2010-04-12 | Payer: Self-pay | Admitting: Internal Medicine

## 2010-04-25 ENCOUNTER — Ambulatory Visit: Payer: Self-pay | Admitting: Family Medicine

## 2010-04-29 ENCOUNTER — Emergency Department (HOSPITAL_COMMUNITY): Admission: EM | Admit: 2010-04-29 | Discharge: 2010-04-29 | Payer: Self-pay | Admitting: Emergency Medicine

## 2010-04-29 ENCOUNTER — Encounter: Payer: Self-pay | Admitting: Family Medicine

## 2010-05-01 ENCOUNTER — Encounter: Payer: Self-pay | Admitting: Internal Medicine

## 2010-05-02 ENCOUNTER — Telehealth: Payer: Self-pay | Admitting: Internal Medicine

## 2010-05-03 ENCOUNTER — Encounter: Payer: Self-pay | Admitting: Internal Medicine

## 2010-05-03 ENCOUNTER — Ambulatory Visit: Payer: Self-pay | Admitting: Internal Medicine

## 2010-05-03 DIAGNOSIS — C73 Malignant neoplasm of thyroid gland: Secondary | ICD-10-CM

## 2010-05-03 DIAGNOSIS — I951 Orthostatic hypotension: Secondary | ICD-10-CM | POA: Insufficient documentation

## 2010-05-04 ENCOUNTER — Encounter (INDEPENDENT_AMBULATORY_CARE_PROVIDER_SITE_OTHER): Payer: Self-pay | Admitting: *Deleted

## 2010-05-24 ENCOUNTER — Telehealth: Payer: Self-pay | Admitting: Internal Medicine

## 2010-05-25 ENCOUNTER — Encounter: Payer: Self-pay | Admitting: Internal Medicine

## 2010-06-08 ENCOUNTER — Encounter: Payer: Self-pay | Admitting: Internal Medicine

## 2010-06-19 ENCOUNTER — Emergency Department (HOSPITAL_COMMUNITY): Admission: EM | Admit: 2010-06-19 | Discharge: 2010-06-20 | Payer: Self-pay | Admitting: Emergency Medicine

## 2010-07-28 ENCOUNTER — Encounter: Payer: Self-pay | Admitting: Internal Medicine

## 2010-07-28 ENCOUNTER — Ambulatory Visit: Payer: Self-pay | Admitting: Internal Medicine

## 2010-07-28 ENCOUNTER — Ambulatory Visit: Payer: Self-pay

## 2010-08-02 ENCOUNTER — Ambulatory Visit: Payer: Self-pay | Admitting: Internal Medicine

## 2010-08-05 ENCOUNTER — Ambulatory Visit: Payer: Self-pay | Admitting: Internal Medicine

## 2010-08-05 ENCOUNTER — Emergency Department (HOSPITAL_COMMUNITY): Admission: EM | Admit: 2010-08-05 | Discharge: 2010-08-05 | Payer: Self-pay | Admitting: Emergency Medicine

## 2010-08-11 LAB — CONVERTED CEMR LAB
AST: 19 units/L (ref 0–37)
Alkaline Phosphatase: 50 units/L (ref 39–117)
Calcium: 9.1 mg/dL (ref 8.4–10.5)
GFR calc non Af Amer: 75.24 mL/min (ref >60)
HDL: 42.6 mg/dL (ref >39.00)
LDL Cholesterol: 112 mg/dL — ABNORMAL HIGH (ref 0–99)
Potassium: 3.4 meq/L — ABNORMAL LOW (ref 3.5–5.1)
Sodium: 139 meq/L (ref 135–145)
Total Bilirubin: 0.8 mg/dL (ref 0.3–1.2)
VLDL: 28.2 mg/dL (ref 0.0–40.0)

## 2010-08-12 ENCOUNTER — Ambulatory Visit: Payer: Self-pay | Admitting: Internal Medicine

## 2010-08-12 ENCOUNTER — Inpatient Hospital Stay (HOSPITAL_COMMUNITY): Admission: EM | Admit: 2010-08-12 | Discharge: 2010-08-16 | Payer: Self-pay | Admitting: Emergency Medicine

## 2010-08-12 ENCOUNTER — Encounter: Payer: Self-pay | Admitting: Internal Medicine

## 2010-08-13 ENCOUNTER — Encounter (INDEPENDENT_AMBULATORY_CARE_PROVIDER_SITE_OTHER): Payer: Self-pay | Admitting: Internal Medicine

## 2010-08-13 ENCOUNTER — Encounter: Payer: Self-pay | Admitting: Internal Medicine

## 2010-08-17 ENCOUNTER — Encounter: Payer: Self-pay | Admitting: Internal Medicine

## 2010-08-17 LAB — CONVERTED CEMR LAB
HDL: 60 mg/dL
Hemoglobin: 12.6 g/dL
Platelets: 162 10*3/uL
Sodium: 132 meq/L

## 2010-08-18 ENCOUNTER — Encounter: Payer: Self-pay | Admitting: Family Medicine

## 2010-08-30 IMAGING — US US SOFT TISSUE HEAD/NECK
1 series · 14 of 25 positions shown · non-contrast
Comparison: None.

CLINICAL DATA: A suspicious left thyroid nodule seen on a carotid
duplex ultrasound.

THYROID ULTRASOUND
TECHNIQUE: Ultrasound examination of the thyroid gland and
adjacent soft tissues was performed.

[Series 1: us soft tissue head/neck · 0.07mm/px · 14 of 35 slices shown]
[im 1/35]
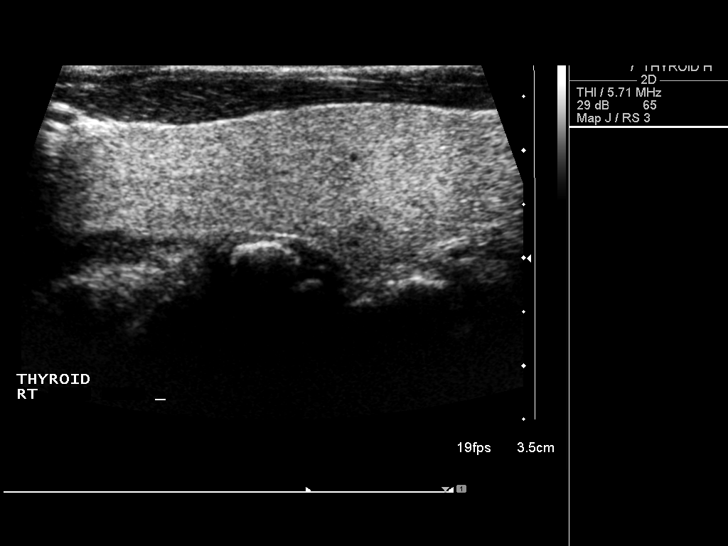
[im 3/35]
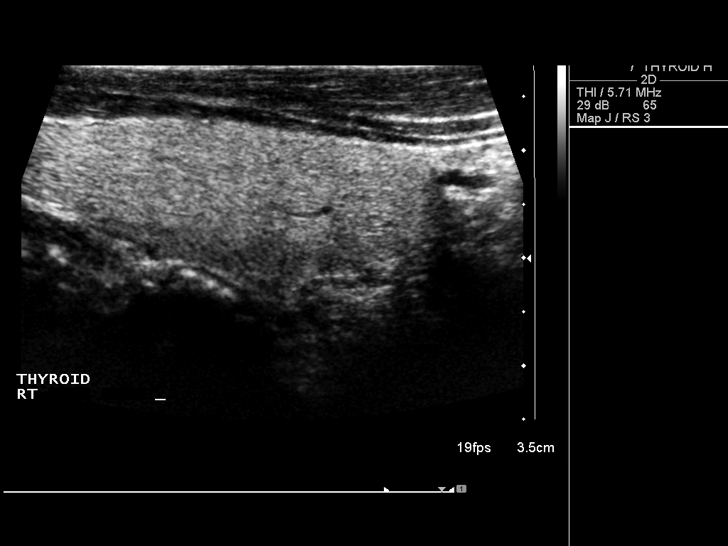
[im 6/35]
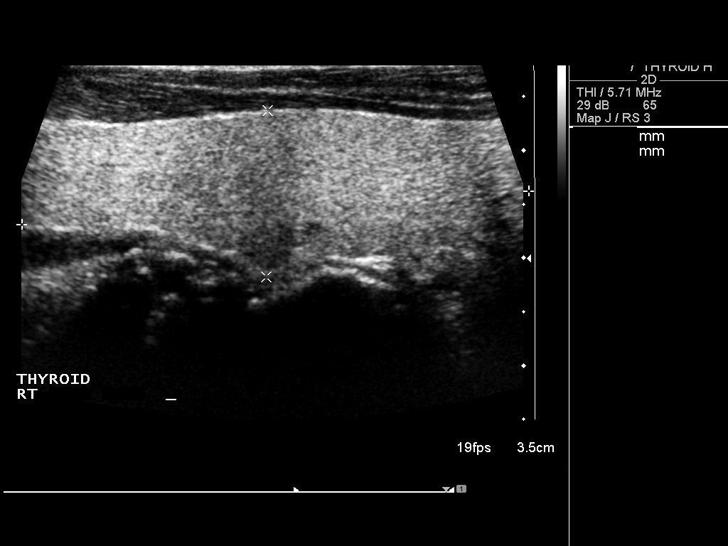
[im 9/35]
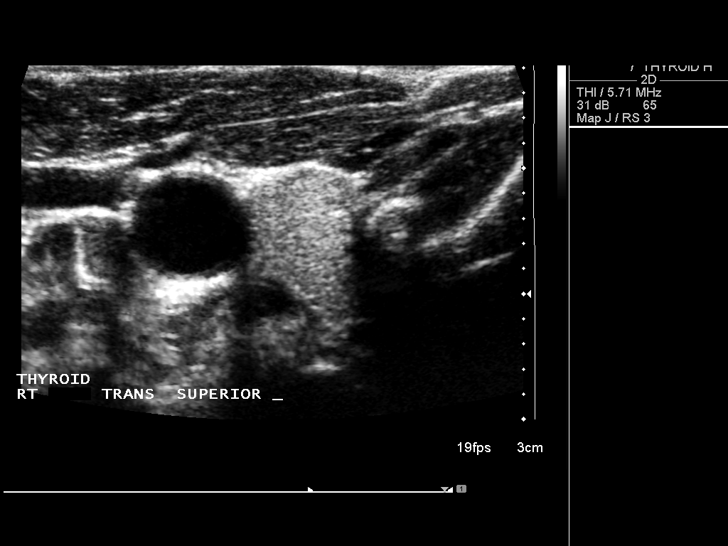
[im 12/35]
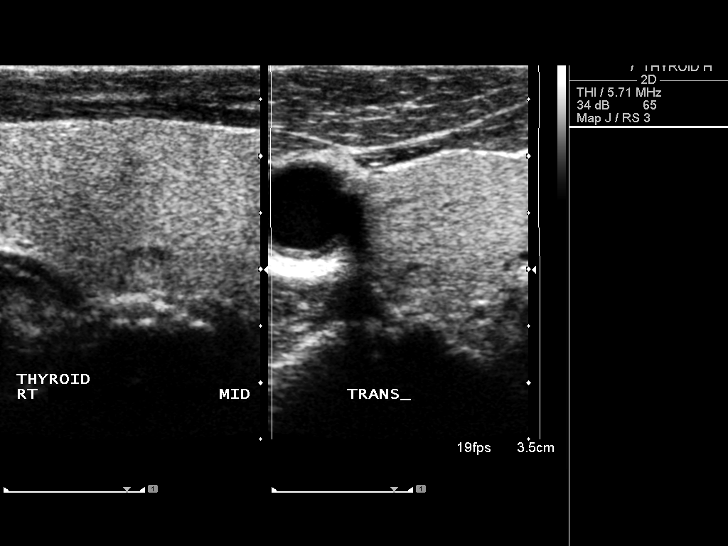
[im 13/35]
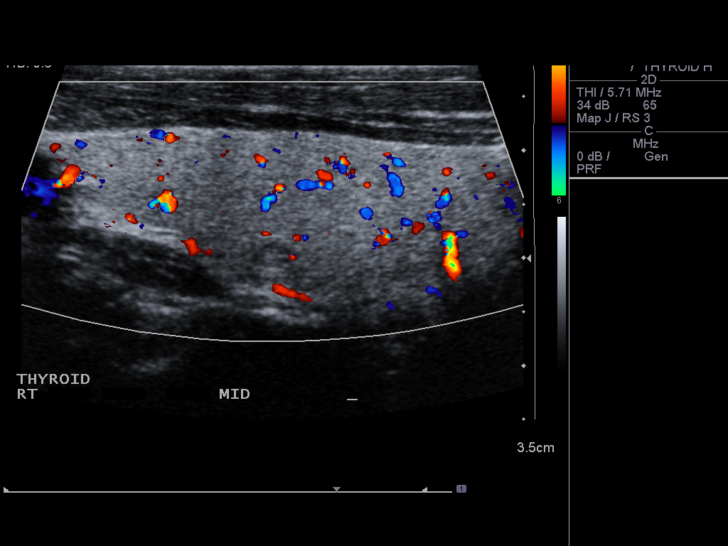
[im 16/35]
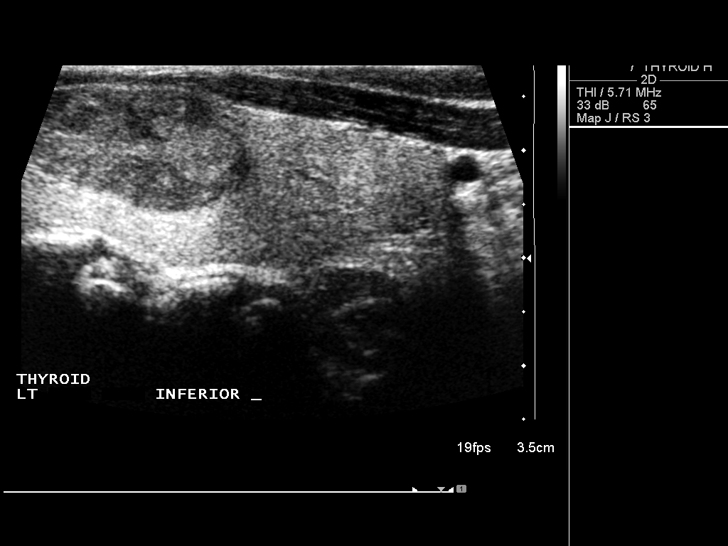
[im 19/35]
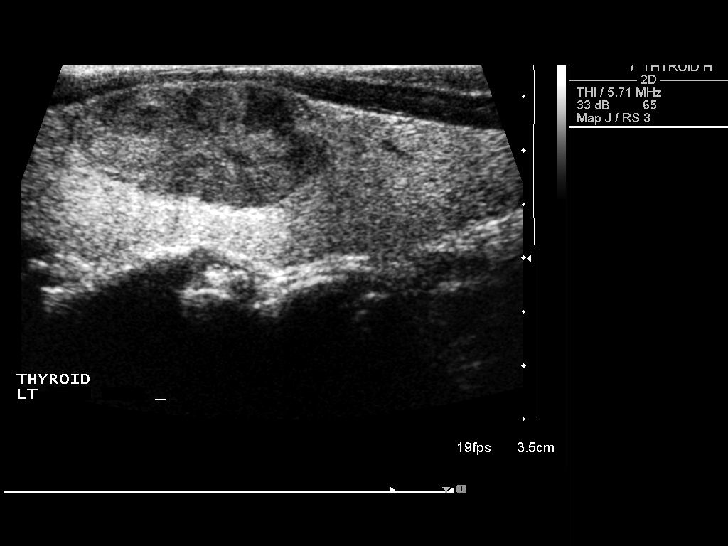
[im 22/35]
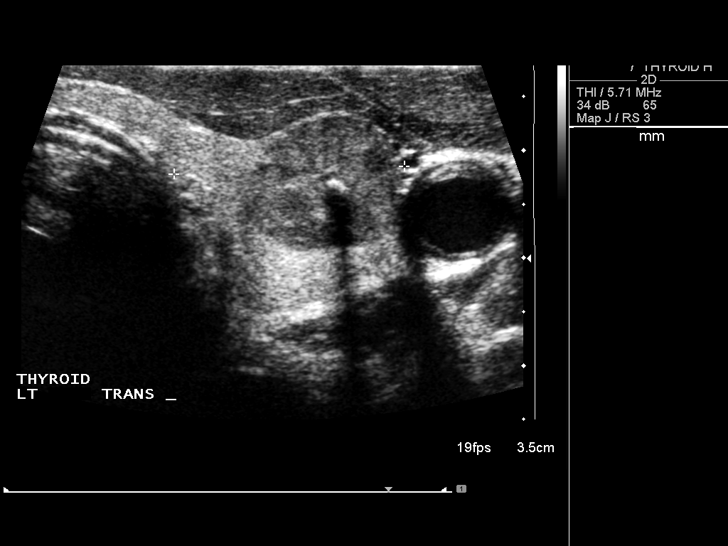
[im 23/35]
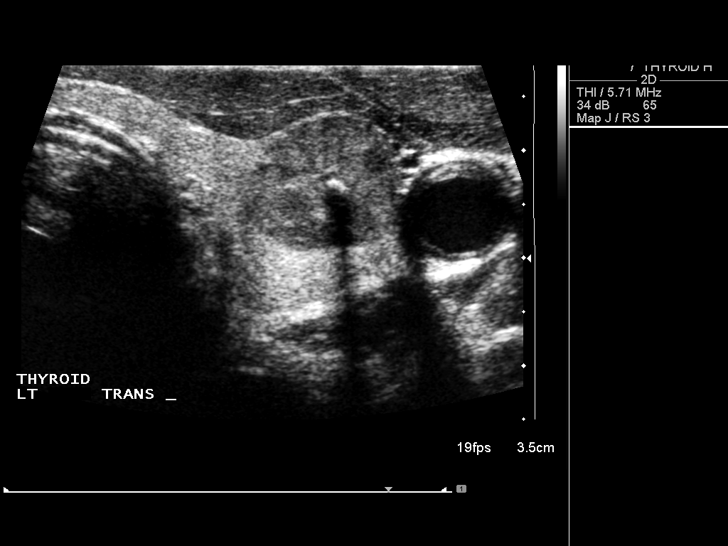
[im 26/35]
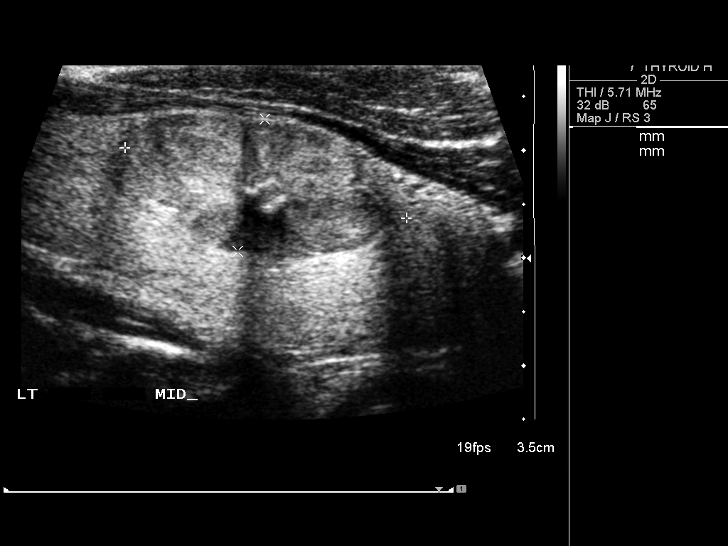
[im 29/35]
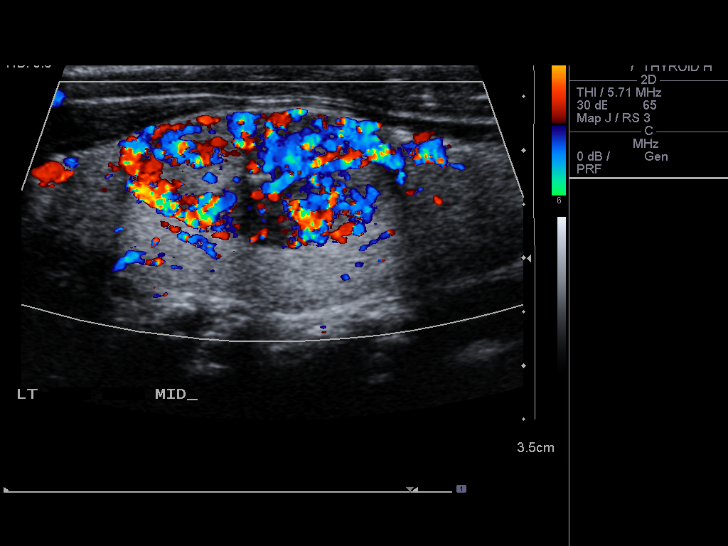
[im 32/35]
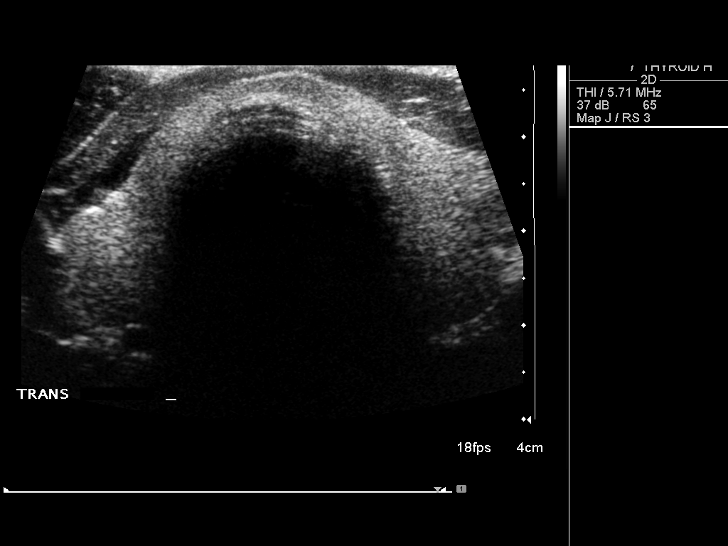
[im 35/35]
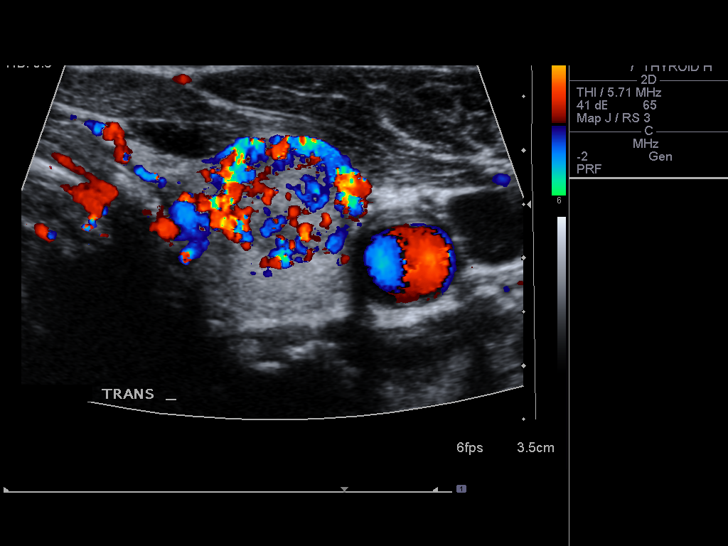

[14 of 25 positions shown; findings below may reference images not displayed]

FINDINGS: Right thyroid lobe measures 5.2 x 1.6 x 1.7 cm.  The left
thyroid lobe measures 5.7 x 2.0 x 2.1 cm.  Isthmus measures 0.3 cm.
Thyroid tissue is homogeneous.  There is a small nodule in the
posterior right thyroid lobe that measures up to 0.4 cm.  There is
a dominant heterogeneous nodule in the mid portion of the left
thyroid lobe that measures 2.7 x 1.3 x 1.4 cm.  There is a central
calcification in this lesion.  In addition, this lesion is very
hyperemic.
IMPRESSION: Dominant hyperemic nodule in the left thyroid lobe.  Nodule
measures up to 2.7 cm.  This lesion warrants fine needle aspiration
based on the size and morphology.  The patient is scheduled to have
a fine needle aspiration following this diagnostic examination.

Sub centimeter right nodule.

## 2010-08-30 IMAGING — US US BIOPSY
1 series · 6 of 6 positions shown · non-contrast
Comparison: Ultrasound from 04/12/2010

CLINICAL DATA: 49-year-old with a dominant left thyroid nodule.

ULTRASOUND-GUIDED NEEDLE ASPIRATE BIOPSY, LEFT LOBE OF THYROID
The above procedure was discussed with the patient and written
informed consent was obtained.

[Series 1: us biopsy · 6 acquisitions, 6 frames shown]
[im 1/6]
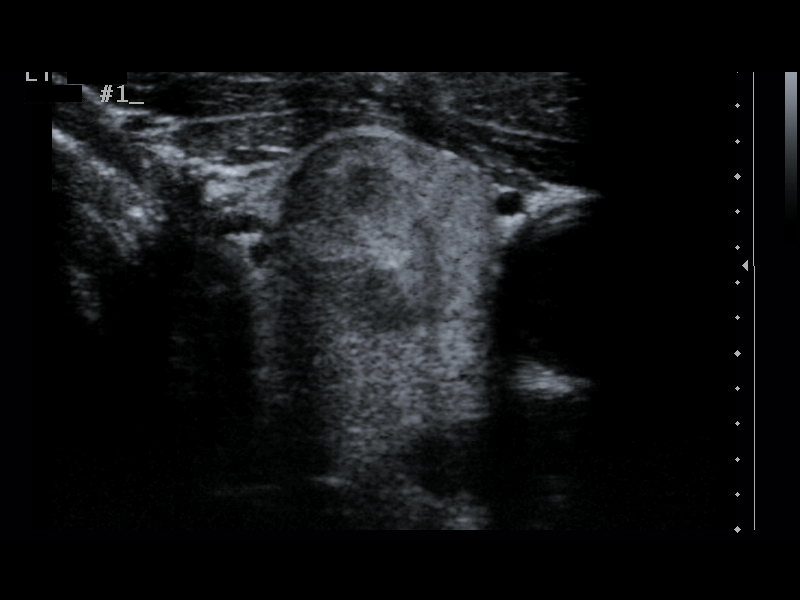
[im 2/6]
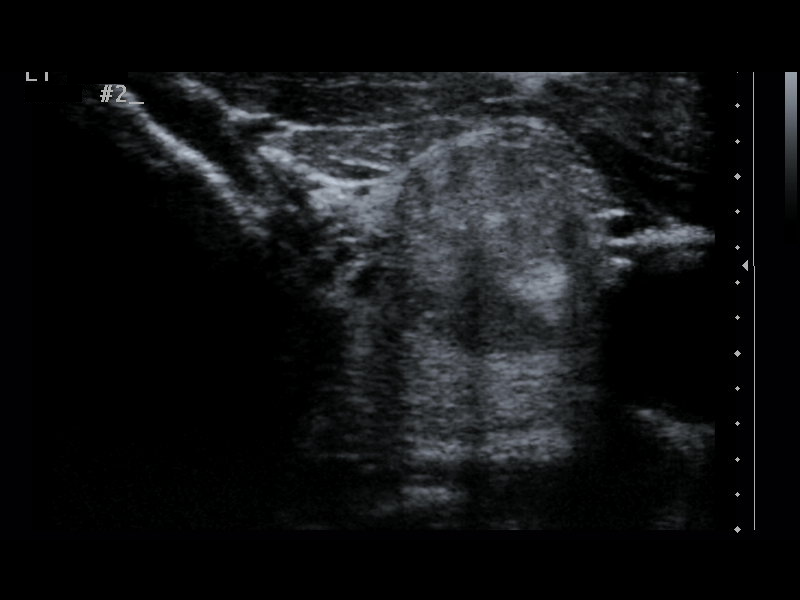
[im 3/6]
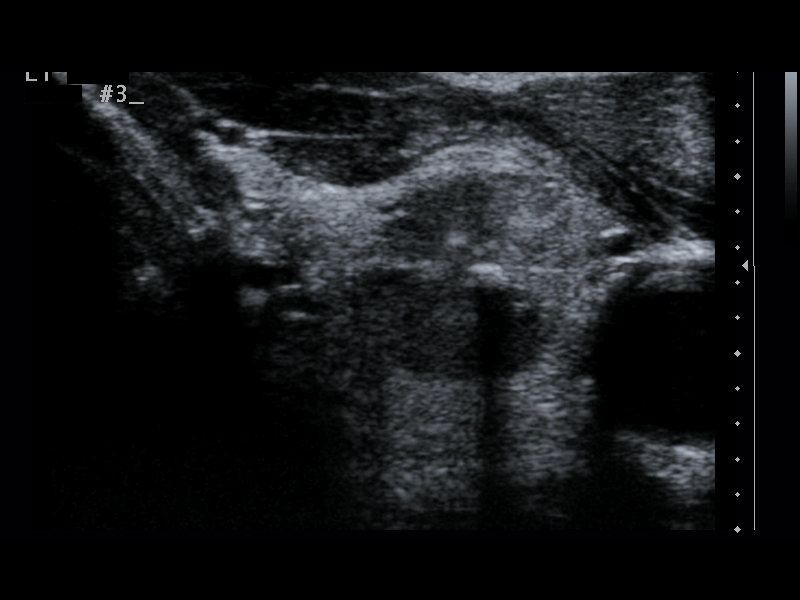
[im 4/6]
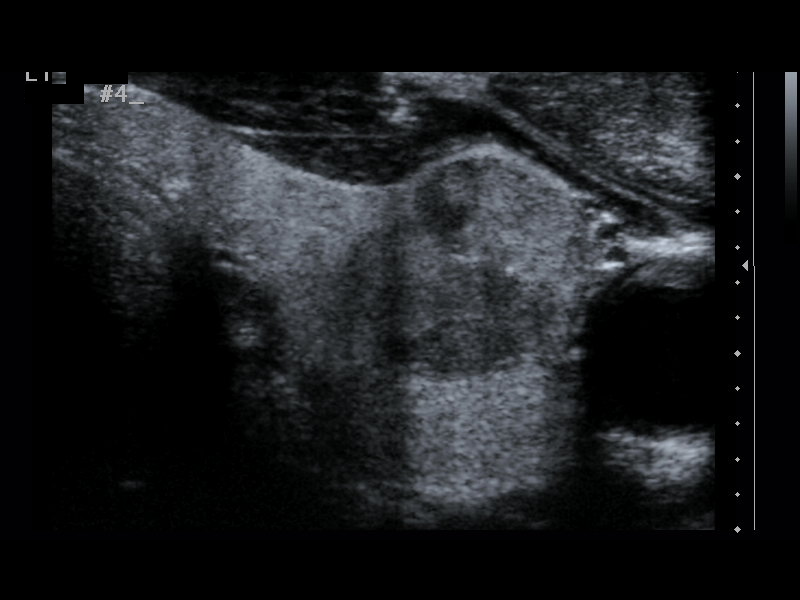
[im 5/6]
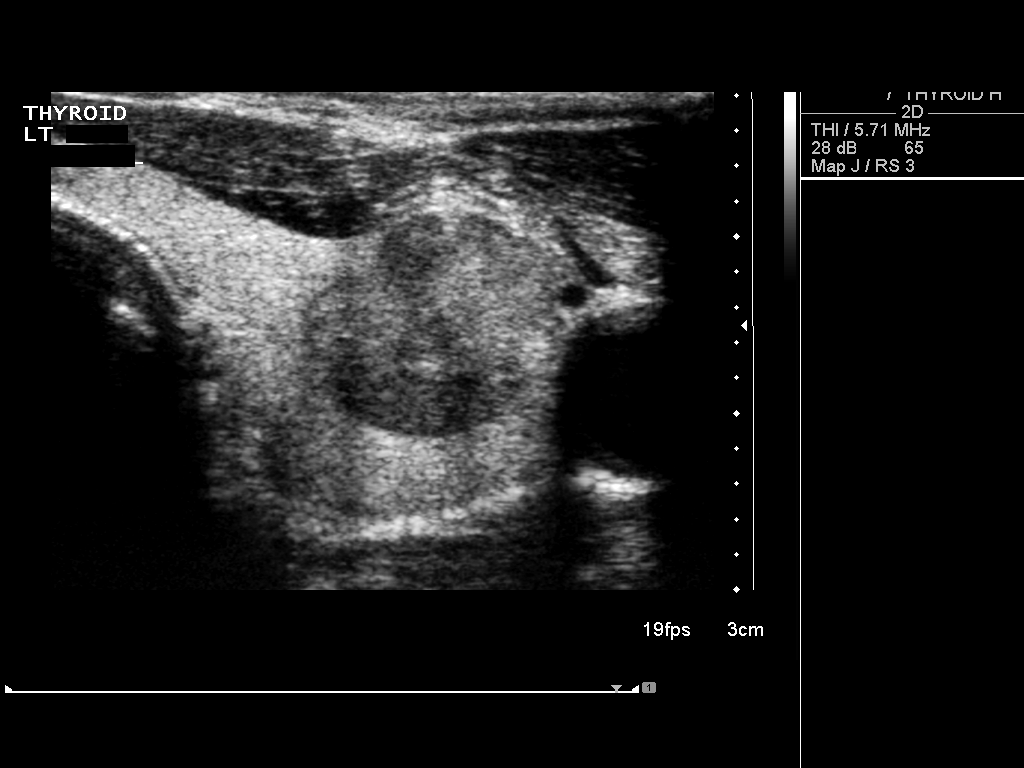
[im 6/6]
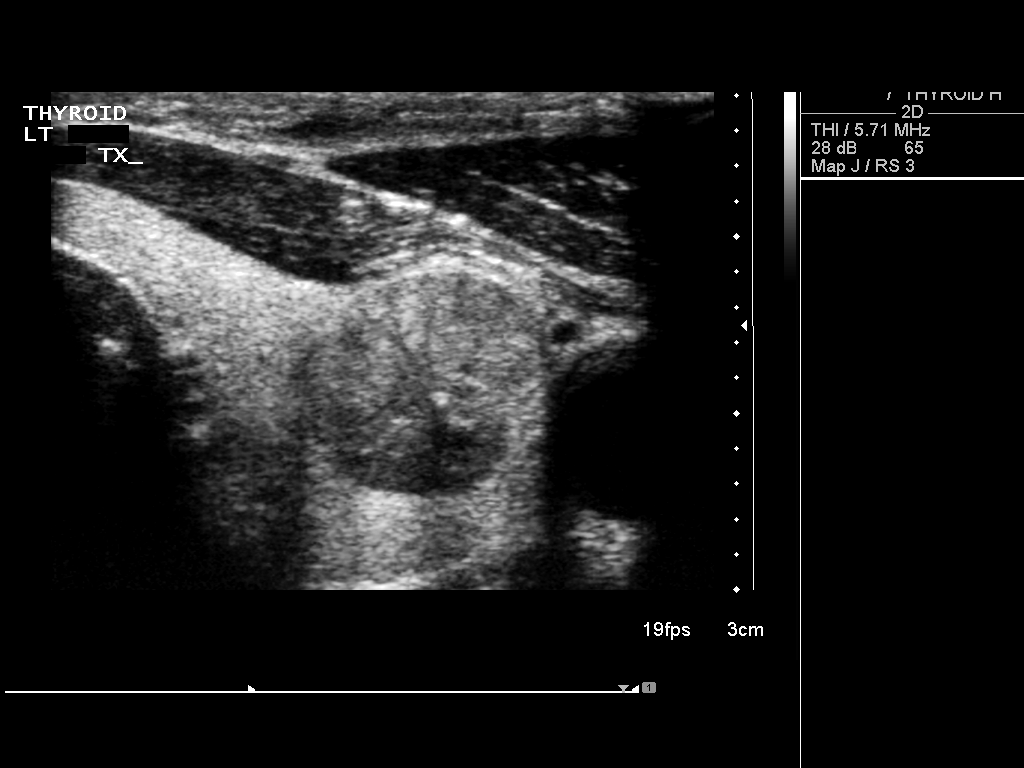

[6 of 6 positions shown; findings below may reference images not displayed]

FINDINGS: Ultrasound was performed to localize and mark an adequate
site for the biopsy.  The patient was then prepped and draped in a
normal sterile fashion.  Local anesthesia was provided with 1%
lidocaine.  Using direct ultrasound guidance, four passes were made
using 25 gauge needles into the nodule within the left lobe of the
thyroid.  Ultrasound was used to confirm needle placements on all
occasions.  Specimens were sent to Pathology for analysis.  Post
procedural imaging demonstrated no hematoma or immediate
complication.  The patient tolerated the procedure well.
IMPRESSION: Ultrasound guided fine needle aspirations of the dominant left
thyroid nodule.

## 2010-08-31 ENCOUNTER — Encounter: Payer: Self-pay | Admitting: Family Medicine

## 2010-08-31 ENCOUNTER — Ambulatory Visit: Payer: Self-pay | Admitting: Family Medicine

## 2010-08-31 LAB — CONVERTED CEMR LAB
CO2: 25 meq/L (ref 19–32)
Calcium: 9 mg/dL (ref 8.4–10.5)
Chloride: 104 meq/L (ref 96–112)
Creatinine, Ser: 1.45 mg/dL (ref 0.40–1.50)
Sodium: 140 meq/L (ref 135–145)

## 2010-09-05 ENCOUNTER — Encounter: Payer: Self-pay | Admitting: Cardiology

## 2010-09-06 ENCOUNTER — Encounter: Payer: Self-pay | Admitting: Internal Medicine

## 2010-09-07 ENCOUNTER — Ambulatory Visit: Payer: Self-pay | Admitting: Cardiothoracic Surgery

## 2010-09-07 ENCOUNTER — Encounter: Admission: RE | Admit: 2010-09-07 | Discharge: 2010-09-07 | Payer: Self-pay | Admitting: Cardiothoracic Surgery

## 2010-09-08 ENCOUNTER — Encounter: Payer: Self-pay | Admitting: Internal Medicine

## 2010-09-09 ENCOUNTER — Encounter: Payer: Self-pay | Admitting: Family Medicine

## 2010-09-16 IMAGING — CR DG CHEST 2V
2 series · 2 of 2 positions shown · non-contrast
Comparison: 11/21/2009

CLINICAL DATA: Dizziness, shortness of breath

CHEST - 2 VIEW

[w chest pa]
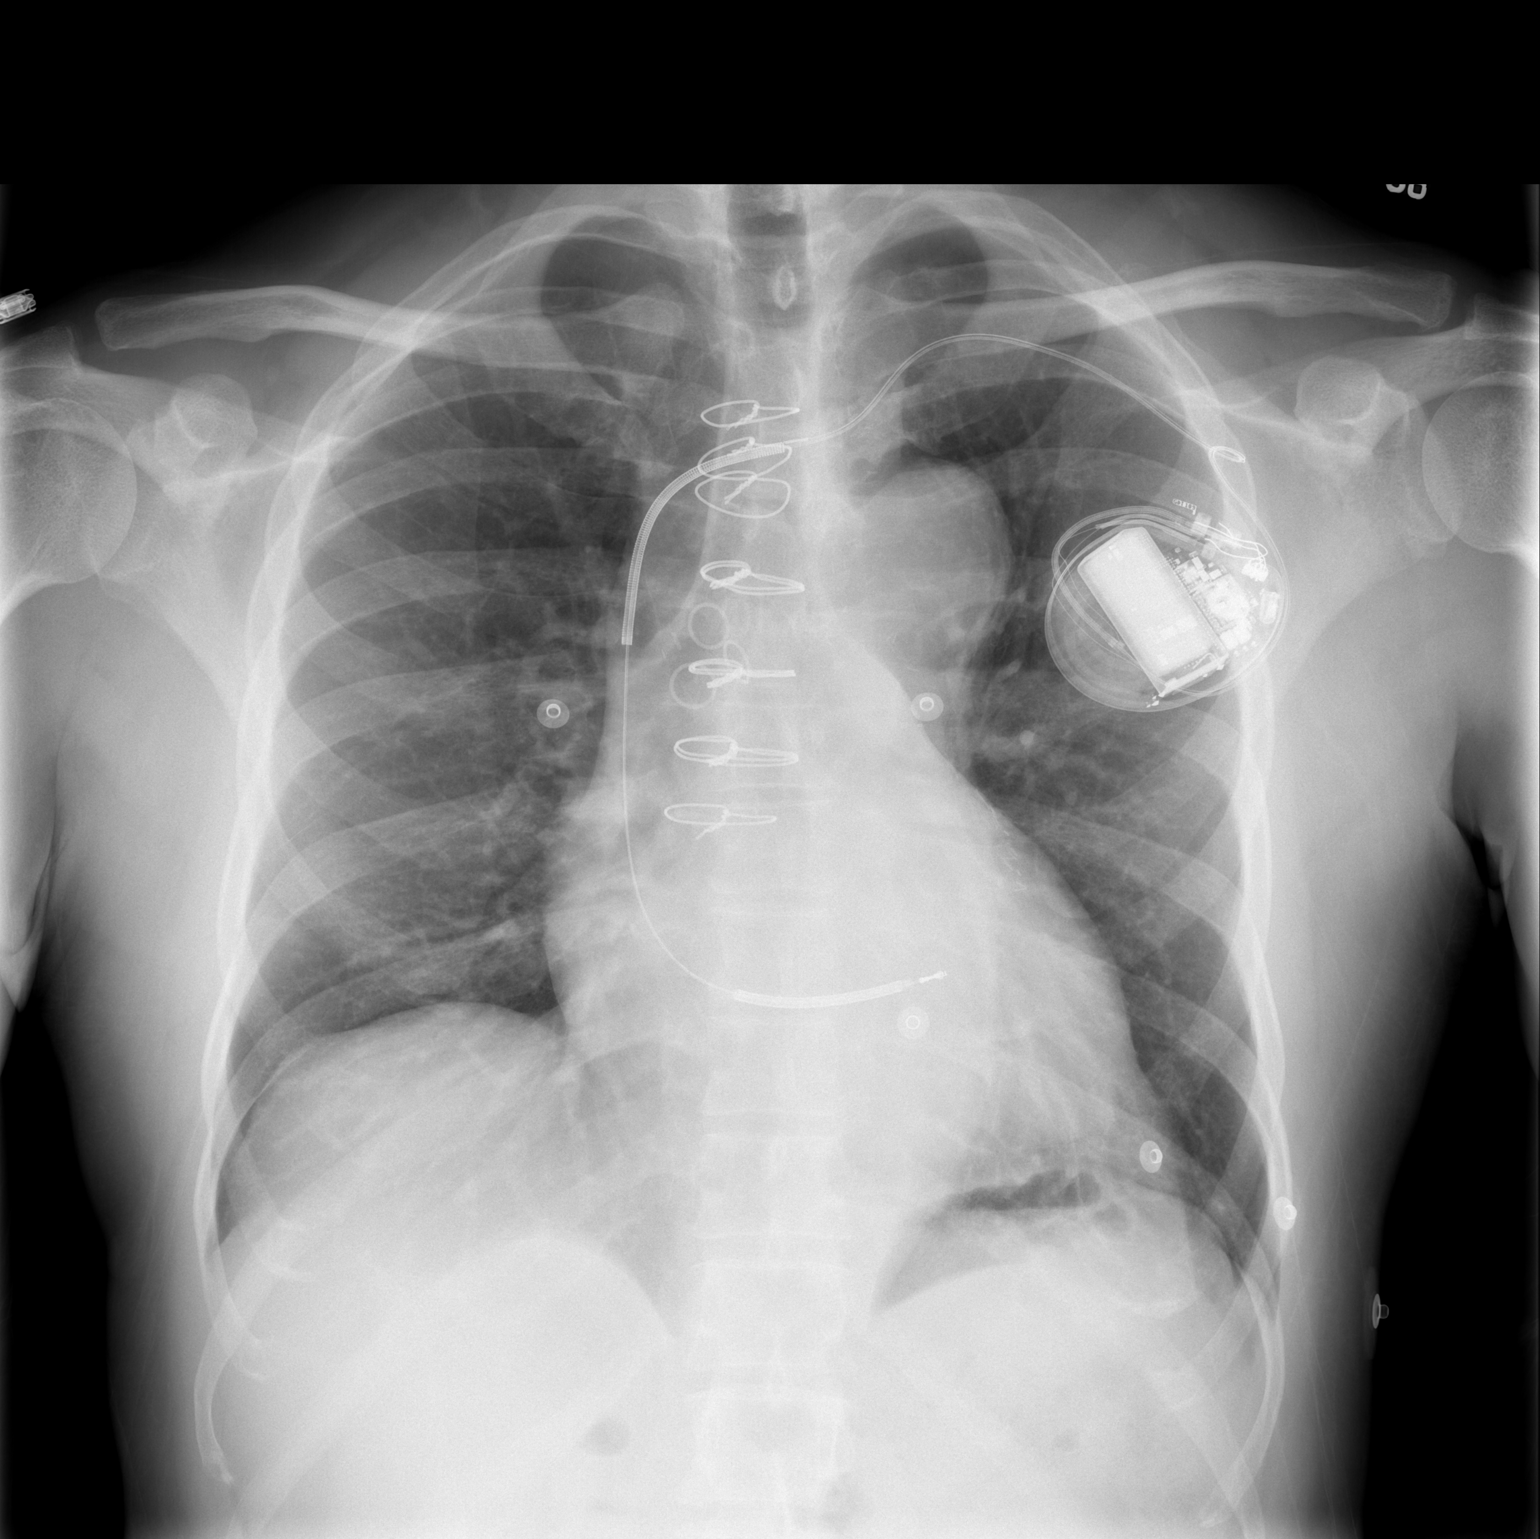

[w chest lat]
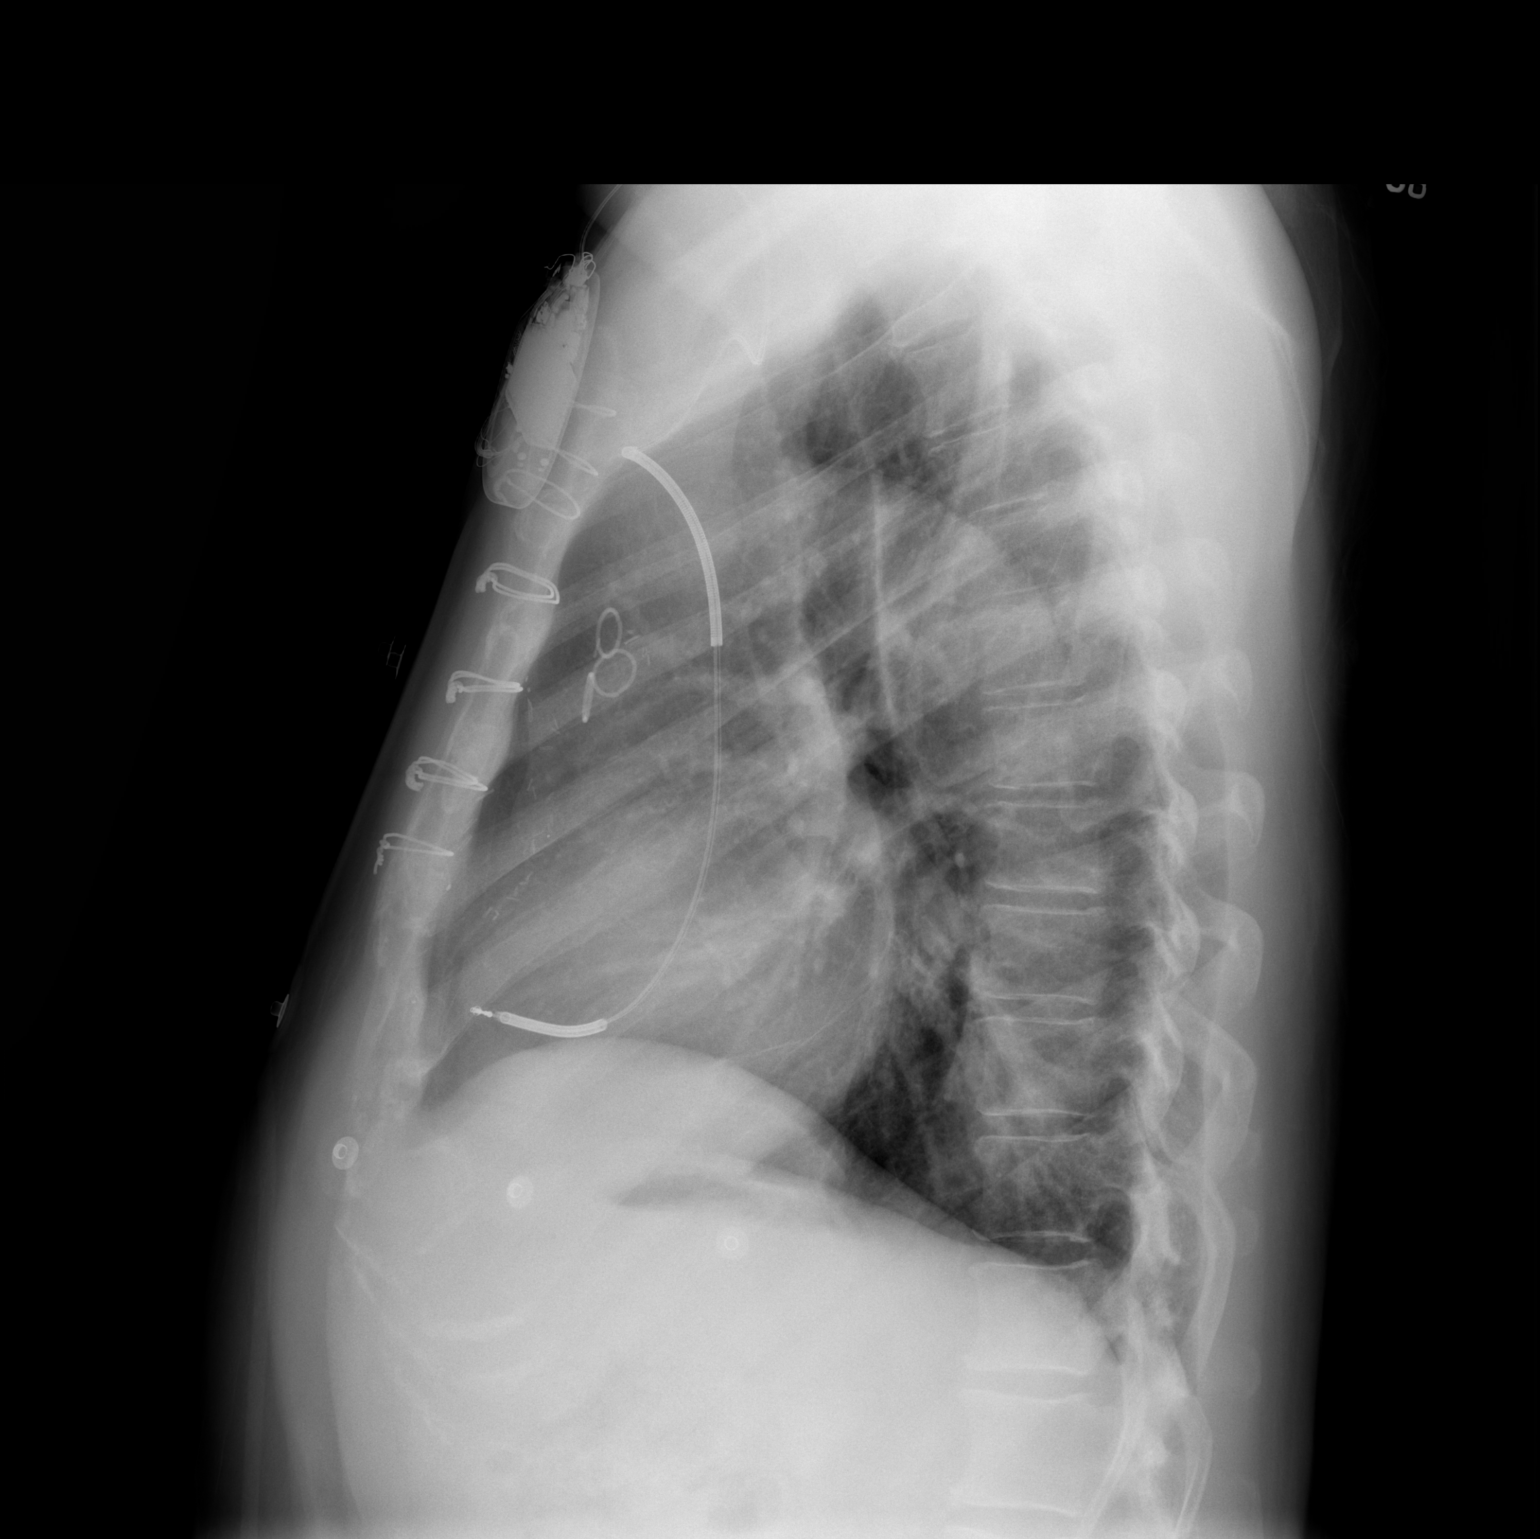

[2 of 2 positions shown; findings below may reference images not displayed]

FINDINGS: Previous CABG.  Stable left subclavian AICD.  Tortuous
thoracic aorta.  Lungs clear.  Heart size upper limits normal.  No
effusion.
IMPRESSION: Stable postop changes.  No acute disease.

## 2010-09-16 IMAGING — CT CT HEAD W/O CM
1 series · 16 of 30 positions shown, 20 images · non-contrast
Comparison: CT head 07/17/2009.

CLINICAL DATA: Syncope.

CT HEAD WITHOUT CONTRAST
TECHNIQUE: Contiguous axial images were obtained from the base of
the skull through the vertex without contrast.

[Series 2: head routine 4.8 h37s · axial · 0.46mm/px · z∈[-117,+43]mm · 16 of 36 slices shown, 20 images]
[im 2/36  brain]
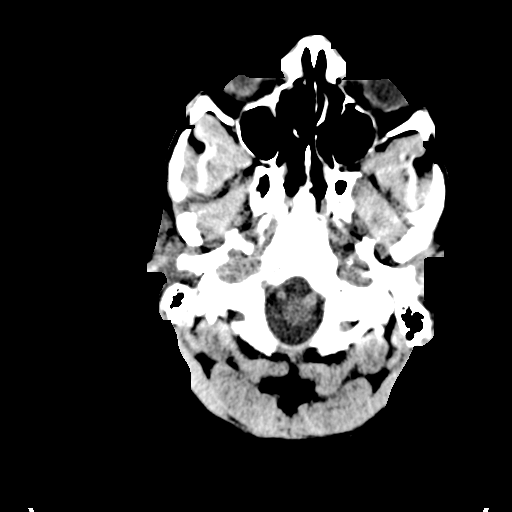
[im 2/36  bone]
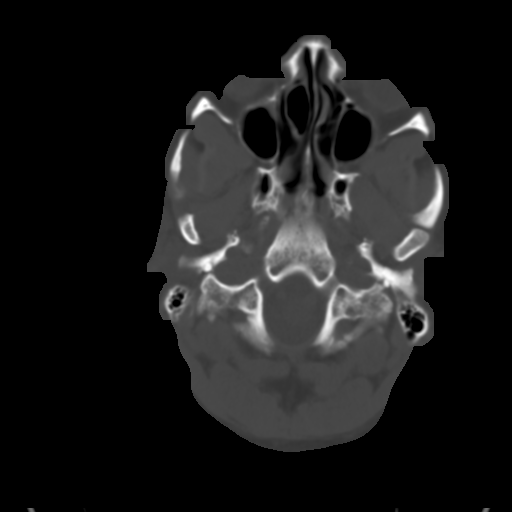
[im 4/36  brain]
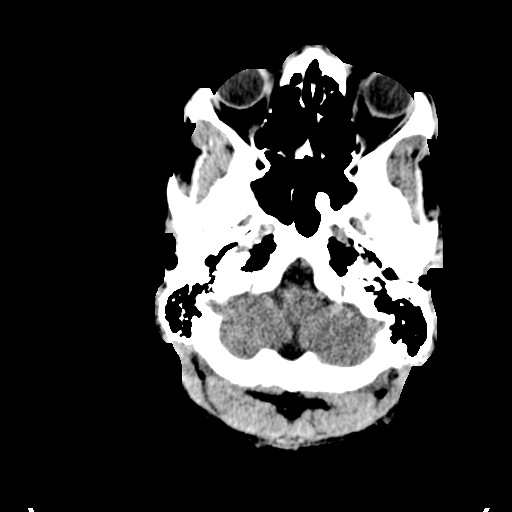
[im 7/36  brain]
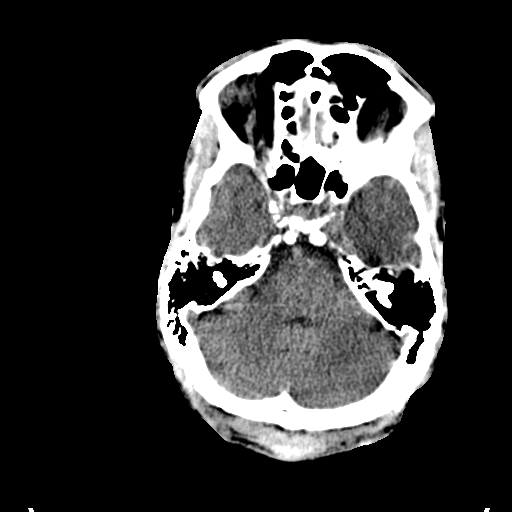
[im 9/36  brain]
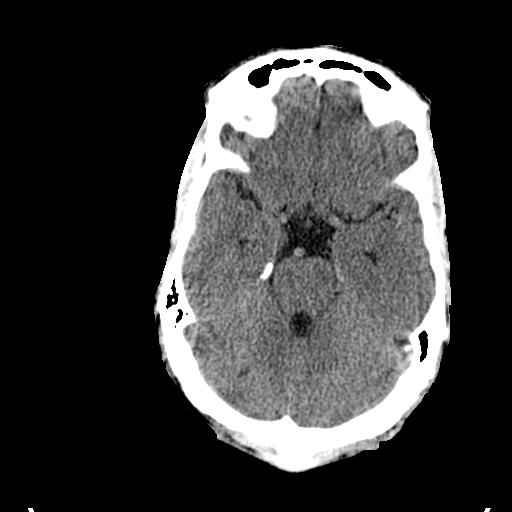
[im 10/36  brain]
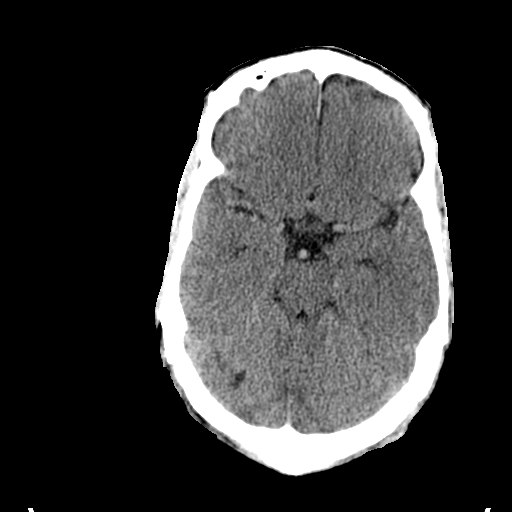
[im 10/36  bone]
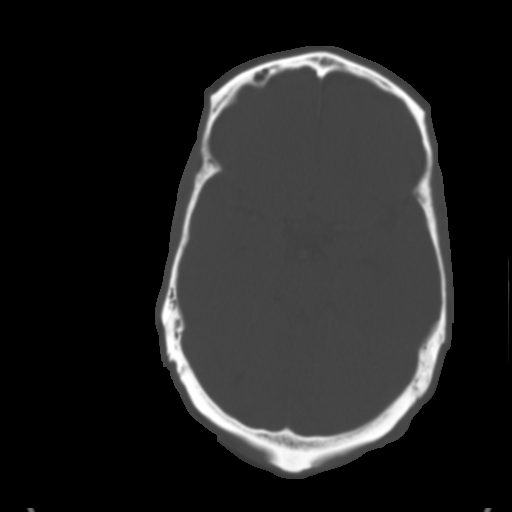
[im 13/36  brain]
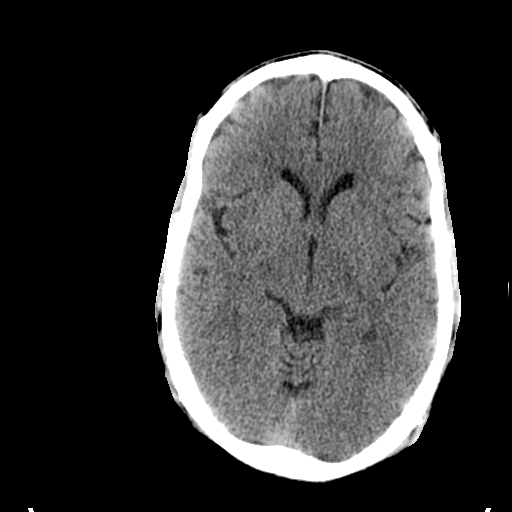
[im 15/36  brain]
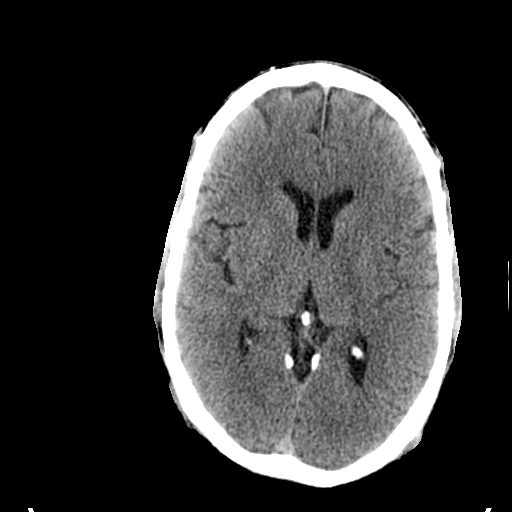
[im 17/36  brain]
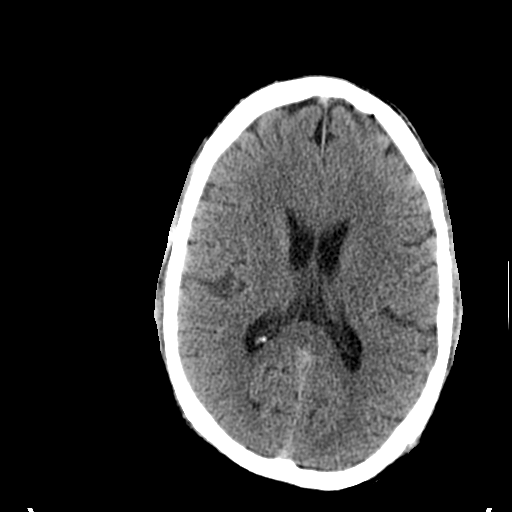
[im 19/36  brain]
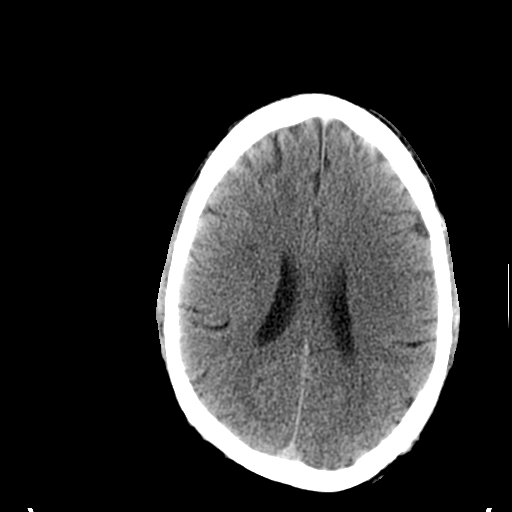
[im 19/36  bone]
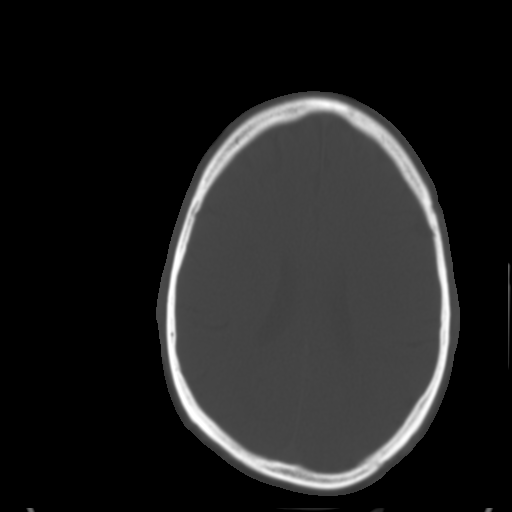
[im 21/36  brain]
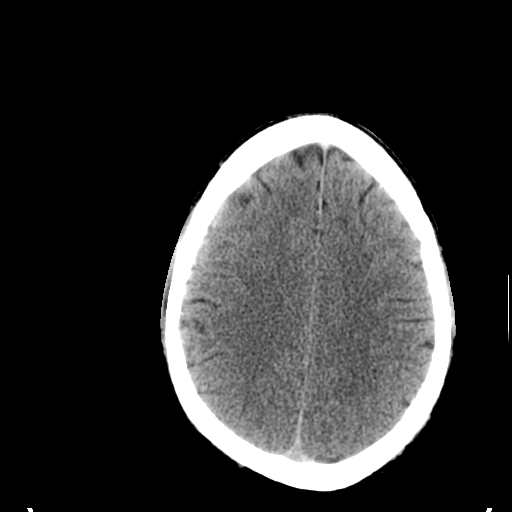
[im 23/36  brain]
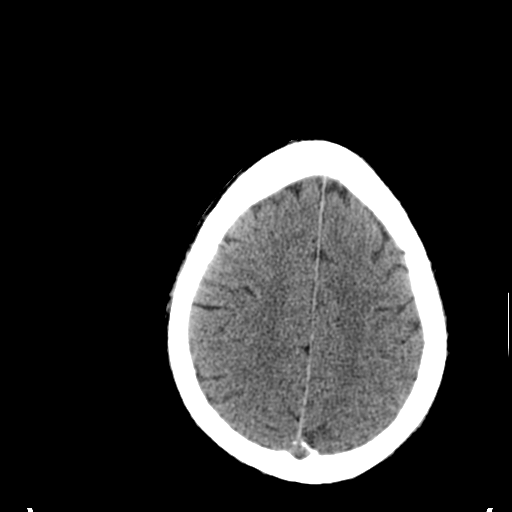
[im 26/36  brain]
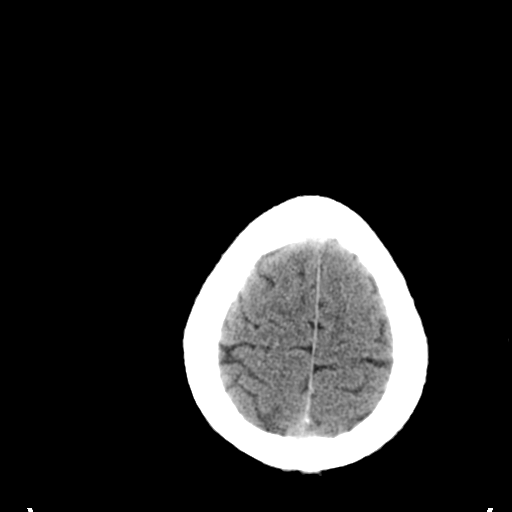
[im 27/36  brain]
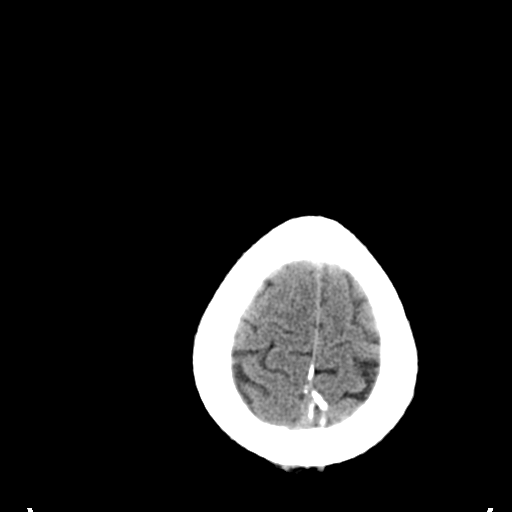
[im 27/36  bone]
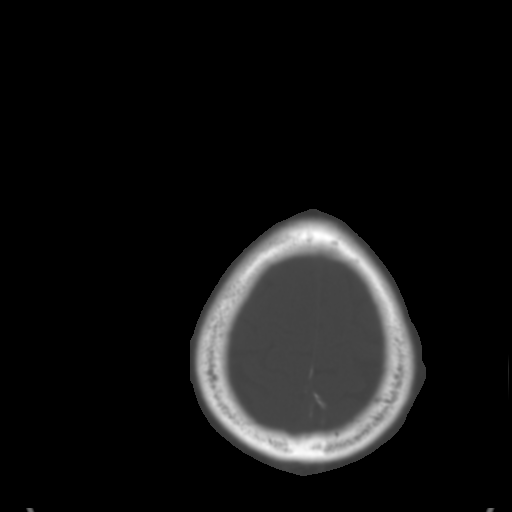
[im 29/36  brain]
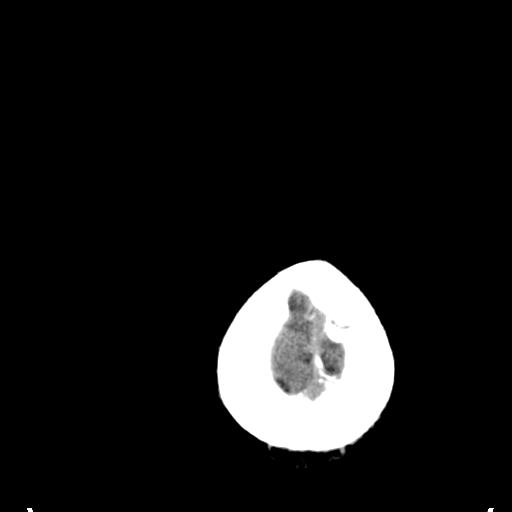
[im 32/36  brain]
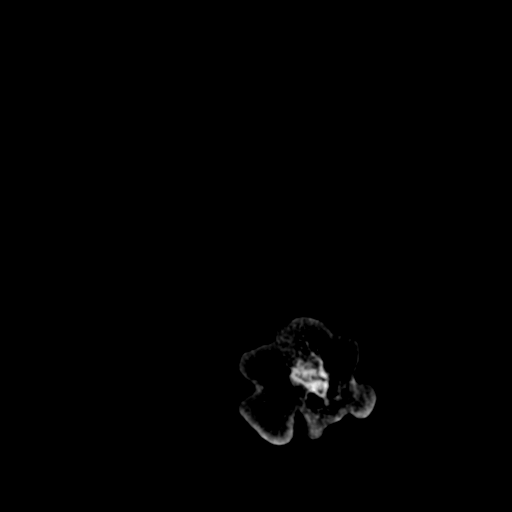
[im 34/36  brain]
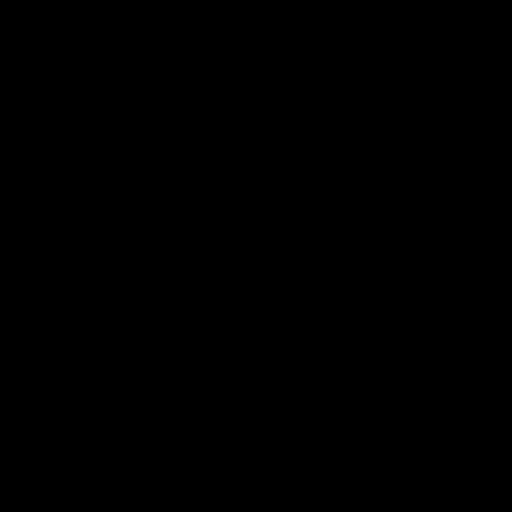

[16 of 30 positions shown; findings below may reference images not displayed]

FINDINGS: Ventricle size is normal.  Negative for infarct or mass.
Negative for intracranial hemorrhage.  Visualized paranasal sinuses
are clear.  The calvarium is intact.
IMPRESSION: Negative.  No interval change.

## 2010-09-24 ENCOUNTER — Telehealth: Payer: Self-pay | Admitting: Nurse Practitioner

## 2010-10-13 ENCOUNTER — Encounter (INDEPENDENT_AMBULATORY_CARE_PROVIDER_SITE_OTHER): Payer: Self-pay | Admitting: *Deleted

## 2010-10-20 ENCOUNTER — Encounter (HOSPITAL_COMMUNITY)
Admission: RE | Admit: 2010-10-20 | Discharge: 2010-11-25 | Payer: Self-pay | Source: Home / Self Care | Attending: Internal Medicine | Admitting: Internal Medicine

## 2010-11-07 IMAGING — CR DG CHEST 1V PORT
1 series · 1 of 1 positions shown · non-contrast
Comparison: 04/29/2010.

CLINICAL DATA: Weakness.  Some shortness of breath.

PORTABLE CHEST - 1 VIEW

[AP]
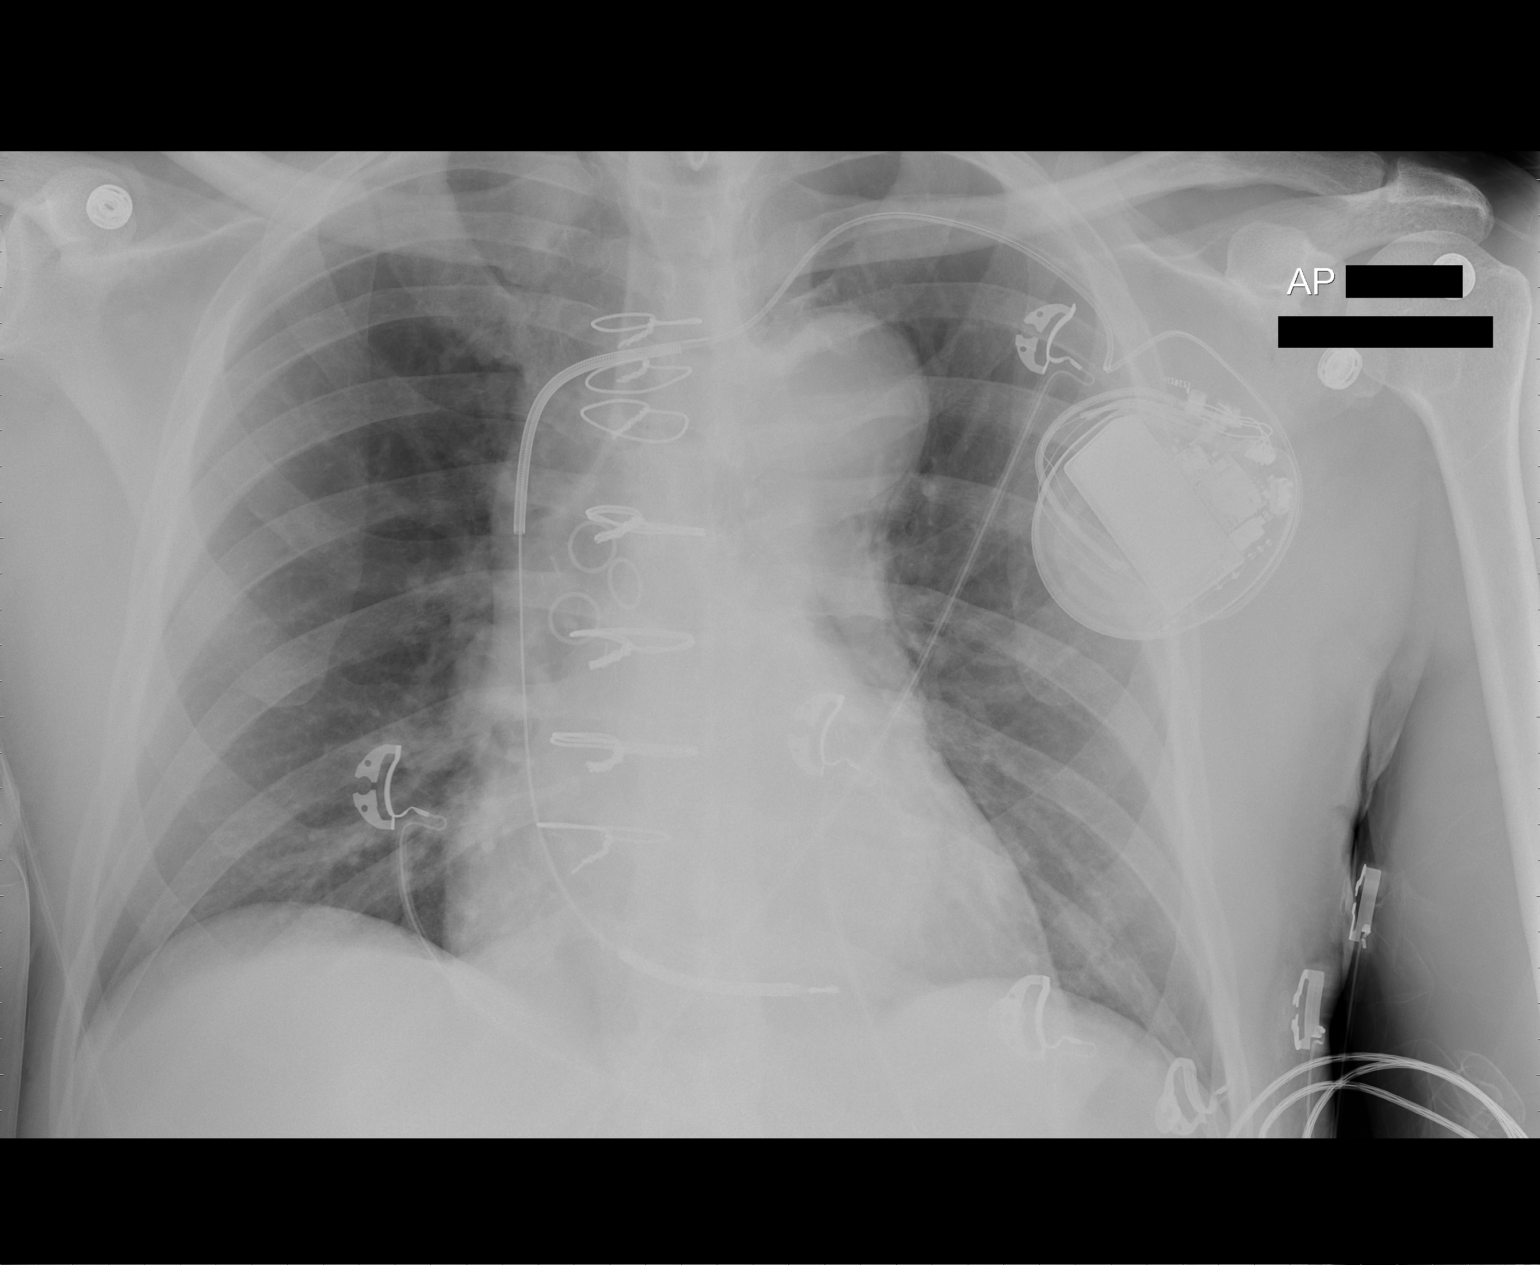

[1 of 1 positions shown; findings below may reference images not displayed]

FINDINGS: The cardiac silhouette remains near the upper limit of
normal in size.  Stable left subclavian AICD lead and post CABG
changes.  Clear lungs with normal vascularity.  The aorta remains
tortuous.  Unremarkable bones.
IMPRESSION: Stable borderline cardiomegaly.  No acute abnormality.

## 2010-11-22 ENCOUNTER — Encounter: Payer: Self-pay | Admitting: Internal Medicine

## 2010-11-23 ENCOUNTER — Telehealth: Payer: Self-pay | Admitting: Internal Medicine

## 2010-11-24 ENCOUNTER — Telehealth (INDEPENDENT_AMBULATORY_CARE_PROVIDER_SITE_OTHER): Payer: Self-pay | Admitting: Physician Assistant

## 2010-11-24 ENCOUNTER — Ambulatory Visit: Payer: Self-pay | Admitting: Internal Medicine

## 2010-12-01 ENCOUNTER — Inpatient Hospital Stay (HOSPITAL_COMMUNITY): Admission: EM | Admit: 2010-12-01 | Discharge: 2010-11-27 | Payer: Self-pay | Admitting: Emergency Medicine

## 2010-12-12 ENCOUNTER — Ambulatory Visit: Payer: Self-pay | Admitting: Physician Assistant

## 2010-12-12 ENCOUNTER — Ambulatory Visit: Payer: Self-pay

## 2010-12-23 IMAGING — CR DG CHEST 1V PORT
1 series · 1 of 1 positions shown · non-contrast
Comparison: Chest radiograph performed 06/20/2010

CLINICAL DATA: Chest pain.

PORTABLE CHEST - 1 VIEW

[view not recorded]
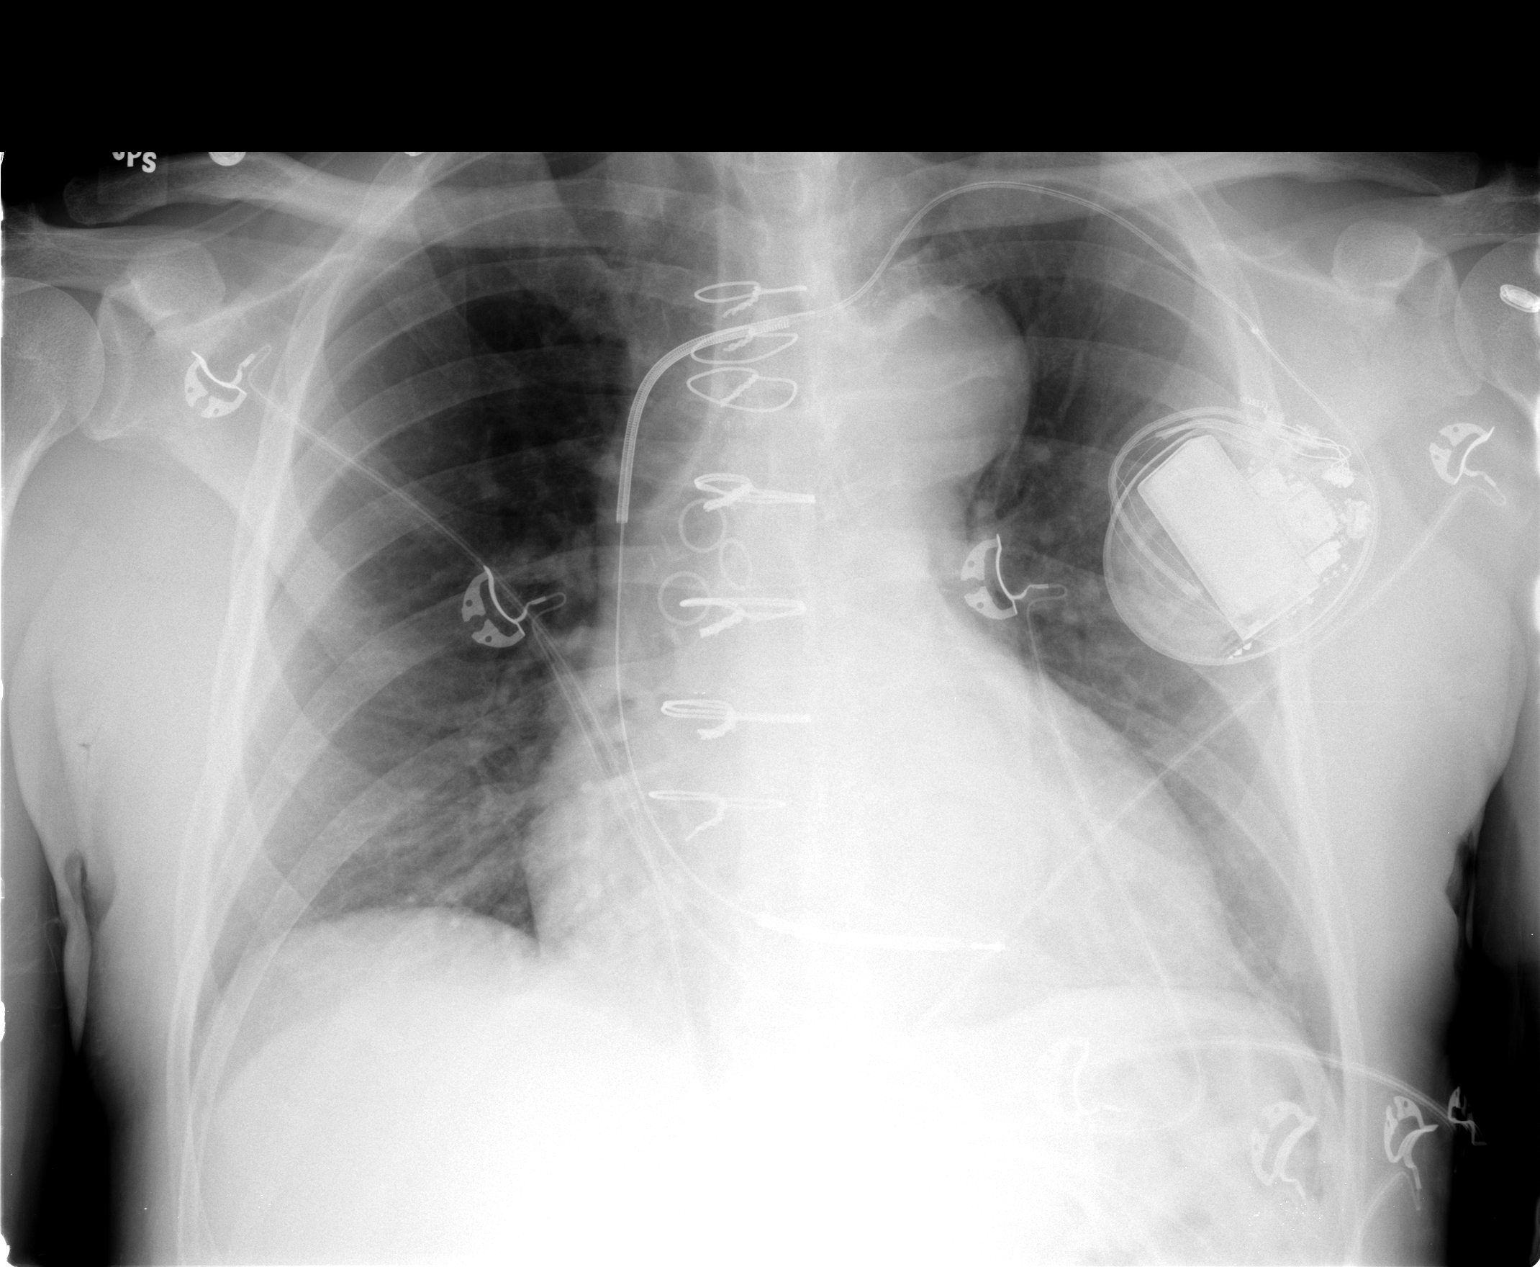

[1 of 1 positions shown; findings below may reference images not displayed]

FINDINGS: The lungs are well-aerated and clear.  There is no
evidence of focal opacification, pleural effusion or pneumothorax.

The cardiomediastinal silhouette is enlarged, more prominent than
on the prior study; the patient is status post median sternotomy,
with evidence of prior CABG.  An AICD is noted overlying the left
chest wall, with a single lead ending overlying the right
ventricle.  No acute osseous abnormalities are seen.
IMPRESSION: Cardiomegaly; lungs remain essentially clear.

## 2010-12-30 IMAGING — CR DG CHEST 1V PORT
1 series · 1 of 1 positions shown · non-contrast
Comparison: Chest radiograph performed 08/05/2010

CLINICAL DATA: Chest pain; patient semi-responsive.

PORTABLE CHEST - 1 VIEW

[AP]
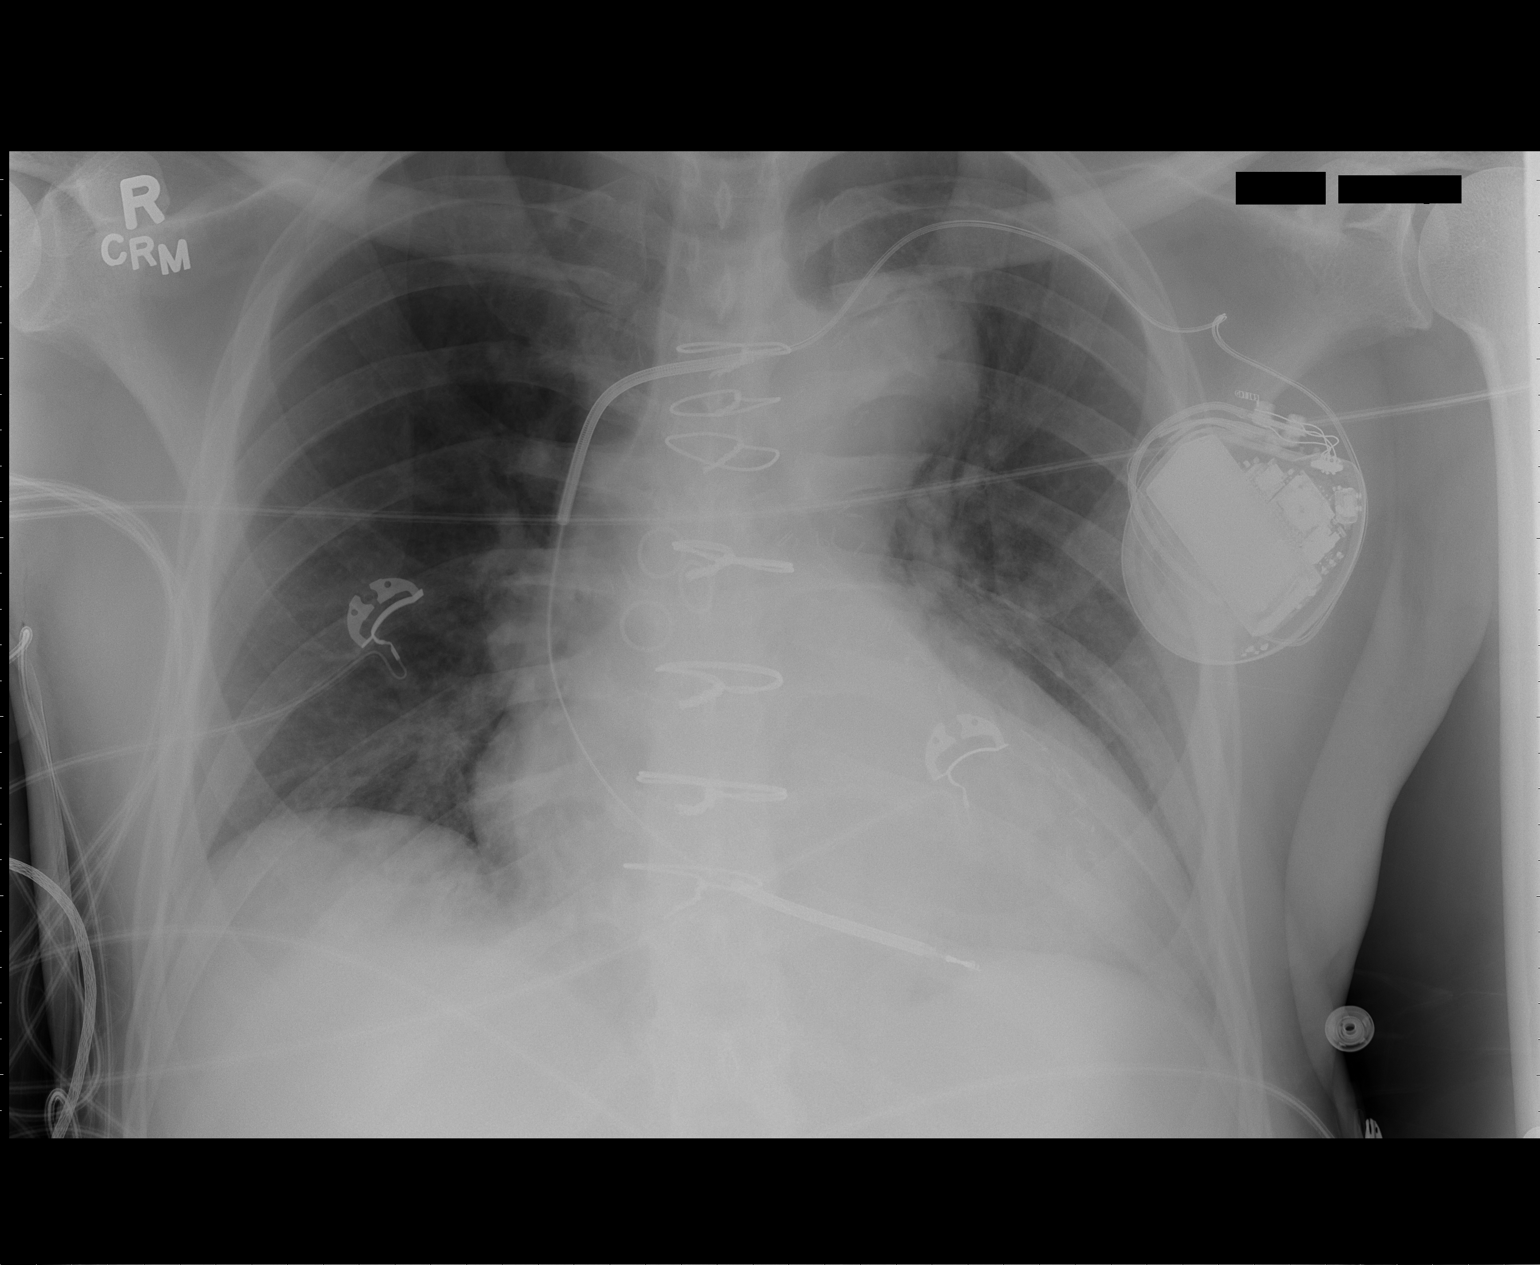

[1 of 1 positions shown; findings below may reference images not displayed]

FINDINGS: The lungs are well-aerated and clear.  Mild vascular
congestion is noted.  There is no evidence of focal opacification,
pleural effusion or pneumothorax.

The cardiomediastinal silhouette is enlarged; the patient is status
post median sternotomy, with evidence of prior CABG.  An AICD is
noted overlying the left chest wall, with a single lead ending
overlying the right ventricle.  No acute osseous abnormalities are
seen.
IMPRESSION: Mild vascular congestion and cardiomegaly noted; no evidence of
edema.

## 2011-01-16 ENCOUNTER — Encounter: Payer: Self-pay | Admitting: Cardiothoracic Surgery

## 2011-01-16 ENCOUNTER — Encounter: Payer: Self-pay | Admitting: Neurosurgery

## 2011-01-19 ENCOUNTER — Encounter (INDEPENDENT_AMBULATORY_CARE_PROVIDER_SITE_OTHER): Payer: Self-pay | Admitting: *Deleted

## 2011-01-22 LAB — CONVERTED CEMR LAB
ALT: 14 units/L (ref 0–53)
AST: 16 units/L (ref 0–37)
Alkaline Phosphatase: 60 units/L (ref 39–117)
CO2: 31 meq/L (ref 19–32)
Eosinophils Relative: 5 % (ref 0.0–5.0)
Free T4: 0.7 ng/dL (ref 0.6–1.6)
GFR calc non Af Amer: 49.15 mL/min (ref >60)
Glucose, Bld: 90 mg/dL (ref 70–99)
HCT: 36.4 % — ABNORMAL LOW (ref 39.0–52.0)
Hemoglobin: 12.4 g/dL — ABNORMAL LOW (ref 13.0–17.0)
Lymphs Abs: 1.4 10*3/uL (ref 0.7–4.0)
Monocytes Relative: 11 % (ref 3.0–12.0)
Platelets: 147 10*3/uL — ABNORMAL LOW (ref 150.0–400.0)
Potassium: 3.5 meq/L (ref 3.5–5.1)
Sodium: 141 meq/L (ref 135–145)
Total Bilirubin: 0.6 mg/dL (ref 0.3–1.2)
WBC: 3.1 10*3/uL — ABNORMAL LOW (ref 4.5–10.5)

## 2011-01-24 NOTE — Progress Notes (Signed)
Summary: SOB AND HIGH B/P  Phone Note Call from Patient Call back at Home Phone [phone]   Caller: Patient Summary of Call: SOB and B/P high Initial call taken by: Judie Grieve,  November 23, 2010 11:40 AM  Follow-up for Phone Call        BP has been as high as 197/119 today 157/101 153/97, he states he feels like his heart muscle is sore, increased SOB, and bad headaches, these symptoms have been going on for a couple of weeks, he states he called into office last pm and spoke w/someone who had advised him to go to ER but didn't want to so they told him to take lasix 80mg  since he had not been taking it, he states that he was up to the bathroom all night and he took another lasix this am but he doesn't feel any better, will discuss w/Dr Bensimhon and call him back Meredith Staggers, RN  November 23, 2010 1:50 PM   Additional Follow-up for Phone Call Additional follow up Details #1::        per Dr Gala Romney sch appt for tom at 9am, pt is aware advised if symptoms worsen to go to ER he is agreeable Meredith Staggers, RN  November 23, 2010 5:30 PM

## 2011-01-24 NOTE — Progress Notes (Signed)
Summary: refill meds  Phone Note Refill Request Call back at Home Phone [phone] Message from:  Patient on April 01, 2010 4:18 PM  Refills Requested: Medication #1:  DIGOXIN 0.25 MG TABS Take one tablet by mouth daily [facility] [address]    Method Requested: Fax to Local Pharmacy Initial call taken by: Lorne Skeens,  April 01, 2010 4:18 PM    Prescriptions: DIGOXIN 0.25 MG TABS (DIGOXIN) Take one tablet by mouth daily  #30 Each x 10   Entered by:   Hardin Negus, RMA   Authorized by:   Dolores Patty, MD, [facility]   Signed by:   Hardin Negus, RMA on 04/04/2010   Method used:   Electronically to        [facility] [phone]* (retail)       [address]       [place], [place]  [address]       Ph: [phone]       Fax: [phone]   RxID:   [id number]

## 2011-01-24 NOTE — Procedures (Signed)
Summary: device/pt is on bensimhon sched. for 10:00/saf   Allergies: 1)  ! Sulfa 2)  ! * Ivp Dye 3)  ! * Latex 4)  ! * Atorvastatin 5)  ! * Shellfish 6)  * Contrast Dye   ICD Specifications Following MD:  Lewayne Bunting, MD     Referring MD:  [facility] ICD Vendor:  [employer]     ICD Model Number:  T175     ICD Serial Number:  [id number] ICD DOI:  11/24/2005     ICD Implanting MD:  Lewayne Bunting, MD  Lead 1:    Location: RV     DOI: 11/24/2005     Model #: [id number]     Serial #: [id number]     Status: active  Indications::  ICM   ICD Follow Up Remote Check?  No Battery Voltage:  2.60 V     Charge Time:  10.8 seconds     Battery Est. Longevity:  MOL2 Underlying rhythm:  SR ICD Dependent:  No      Episodes Coumadin:  No Shock:  0     ATP:  0     Nonsustained:  0     Ventricular Pacing:  0%  Brady Parameters Mode VVI     Lower Rate Limit:  40      Tachy Zones VF:  250     VT:  210     VT1:  185     Next Cardiology Appt Due:  10/25/2010 Tech Comments:  No parameter changes.  Device function normal.  ROV 3 months clinic.  ERI tones demonstrated for the patient.  Altha Harm, LPN  July 28, 2010 11:18 AM

## 2011-01-24 NOTE — Miscellaneous (Signed)
Summary: [facility]   [facility]   Imported By: Roderic Ovens 07/07/2010 15:42:52  _____________________________________________________________________  External Attachment:    Type:   Image     Comment:   External Document

## 2011-01-24 NOTE — Letter (Signed)
Summary: Appointment - Missed  [facility], Main Office  [address]   [place], [place] [address]   Phone: [phone]  Fax: [phone]     October 13, 2010 MRN: [id number]   Chad [name] [address] [place], [place]  [address]   Dear [name],  Our records indicate you missed your appointment on October 10,2011 with Dr.Bensimhon . It is very important that we reach you to reschedule this appointment. We look forward to participating in your health care needs. Please contact us at the number listed above at your earliest convenience to reschedule this appointment.     Sincerely,  Artist

## 2011-01-24 NOTE — Assessment & Plan Note (Signed)
Summary: np/eo   Vital Signs:  Patient profile:   50 year old male Height:      71.5 inches Weight:      183 pounds BMI:     25.26 Temp:     98.3 degrees F oral Pulse rate:   67 / minute BP sitting:   159 / 93  (left arm) Cuff size:   regular  Vitals Entered By: Tessie Fass CMA (Apr 25, 2010 10:19 AM) CC: new pt Is Patient Diabetic? No Pain Assessment Patient in pain? yes     Location: lower back, hips and legs Intensity: 4   Primary Care Provider:  Clementeen Graham  CC:  new pt.  History of Present Illness: Chad [name] presents to clinic today to establish himself as a new patient.  He has multiple medical problems currently managed by several physicians in town.  He currently feels well and comes with his medications.  He does note some lower back pain today however it is chronic and managed by vicodin provided by another physician. He denies any bowel or bladder dysfunction.  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 Take One Tablet By Mouth Daily 2)  Aspirin 81 Mg Tbec (Aspirin) .... Take One Tablet By Mouth Daily 3)  Carvedilol 25 Mg Tabs (Carvedilol) .... Take One Tablet By Mouth Twice A Day 4)  Lasix 80 Mg Tabs (Furosemide) .... As Needed 5)  Potassium Chloride Crys Cr 20 Meq Cr-Tabs (Potassium Chloride Crys Cr) .... Take With Lasix Take At Least 1 A Mth 6)  Amlodipine Besylate 5 Mg Tabs (Amlodipine  Besylate) .... Take One Tablet By Mouth Daily 7)  Hydralazine Hcl 50 Mg Tabs (Hydralazine Hcl) .... Take One Tablet By Mouth Three Times A Day 8)  Hydrocodone-Acetaminophen 10-325 Mg Tabs (Hydrocodone-Acetaminophen) .... 4-6 Hrs As Needed 9)  Amoxicillin 500 Mg Caps (Amoxicillin) .... Take 4 Capsules 1 Hr Before Dental Appt 10)  Isosorbide Mononitrate Cr 30 Mg Xr24h-Tab (Isosorbide Mononitrate) .... Take One Tablet By Mouth Daily 11)  Simvastatin 40 Mg Tabs (Simvastatin) .... Take One Tablet By Mouth Daily At Bedtime  Allergies (verified): 1)  ! Sulfa 2)  ! * Ivp Dye 3)  ! * Latex 4)  ! * Atorvastatin 5)  ! 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  Alcohol Use - no  Risk Factors: Smoking Status: never (04/25/2010)  Review of Systems  The patient denies anorexia, fever, weight loss, weight gain, vision loss, decreased hearing, hoarseness, chest pain, syncope, dyspnea on exertion, peripheral edema, prolonged cough, headaches, hemoptysis, abdominal pain, melena, hematochezia, severe indigestion/heartburn, hematuria, and incontinence.    Physical Exam  General:  VS noted. Well male in NAD Mouth:  Teeth,  gums and palate normal. Oral mucosa normal. Lungs:  clear Heart:  RRR with normal S1 and S2.  PMI is enlarged and laterally displaced. 2/6 systolic murmur at the ULSB with radiation to the carrotids.  Abdomen:  Bowel sounds positive; abdomen soft and non-tender without masses, organomegaly, or hernias noted. No hepatosplenomegaly. Pulses:  pulses normal in all 4 extremities Extremities:  No clubbing or cyanosis. No edema. Cervical Nodes:  No lymphadenopathy noted   Impression & Recommendations:  Problem # 1:  HYPERTENSION, BENIGN (ICD-401.1) Assessment Unchanged HTN is poorly controlled today.  However Chad [name] did not take all of his medications this morning.  His BP is managed by cardiology.  Will follow up their reccs and coordinate care.   His updated medication list for this problem includes:    Carvedilol 25 Mg Tabs (Carvedilol) .[name]... Take one tablet by mouth twice a day    Lasix 80 Mg Tabs (Furosemide) .[name]... As needed    Amlodipine Besylate 5 Mg Tabs (Amlodipine besylate) .[name]... Take one tablet by mouth daily    Hydralazine Hcl 50 Mg Tabs (Hydralazine hcl) .[name]... Take one tablet by mouth three times a day  BP today: 159/93 Prior BP: 160/80 (03/24/2010)  Labs Reviewed: K+: 3.5 (10/25/2009) Creat: : 1.9 (10/25/2009)   Chol: 212 (01/14/2008)   HDL: 41.3 (01/14/2008)   LDL: DEL (01/14/2008)   TG: 153 (01/14/2008)  Complete Medication List: 1)  Digoxin 0.25 Mg Tabs (Digoxin) .... Take one tablet by mouth daily 2)  Aspirin 81 Mg Tbec (Aspirin) .... Take one tablet by mouth daily 3)  Carvedilol 25 Mg Tabs (Carvedilol) .... Take one tablet by mouth twice a day 4)  Lasix 80 Mg Tabs (Furosemide) .... As needed 5)  Potassium Chloride Crys Cr 20 Meq Cr-tabs (Potassium chloride crys cr) .... Take with lasix take at least 1 a mth 6)  Amlodipine Besylate 5 Mg Tabs (Amlodipine besylate) .... Take one tablet by mouth daily 7)  Hydralazine Hcl 50 Mg Tabs (Hydralazine hcl) .... Take one  tablet by mouth three times a day 8)  Hydrocodone-acetaminophen 10-325 Mg Tabs (Hydrocodone-acetaminophen) .... 4-6 hrs as needed 9)  Amoxicillin 500 Mg Caps (Amoxicillin) .... Take 4 capsules 1 hr before dental appt 10)  Isosorbide Mononitrate Cr 30 Mg Xr24h-tab (Isosorbide mononitrate) .... Take one tablet by mouth daily 11)  Simvastatin 40 Mg Tabs (Simvastatin) .... Take one tablet by mouth daily at bedtime  Patient Instructions: 1)  Thank you for seeing me today. 2)  Please schedule a follow-up appointment in 1 year.  3)  If you have chest pain, difficulty breathing, fevers over 102 that does not get better with tylenol please call us or see a doctor.  4)  Please let me know if you feel worse.

## 2011-01-24 NOTE — Progress Notes (Signed)
Summary: Cardiology Phone Note - Out of Pain Meds  Phone Note Call from Patient Call back at Home Phone [phone]   Caller: Patient Summary of Call: received call from Alvino Bruni this evening requesting a refill on his norco 10-325.  he normally has this filled by dr. Dutch Quint but he has tried and failed to get in touch with anyone from dr. Ethelene Browns office today and is desperate given h/o chronic back and leg pain.  i called his [facility] @ [phone], and the pharmicist there was able to confirm for me that he is prescribed norco 10-325mg  ii tabs by mouth q 6h as needed pain and that he had 90 filled on sept. 18th, thus if taken as prescribed, he should be out.  i went ahead and authorized a refill of norco 10-325 ii tabs by mouth q6h as needed pain, #16, zero refills.  I called pt. back to alert him of refill and made it clear that he would have to contact dr. Ethelene Browns office on Monday for any additional refills. Initial call taken by: Creig Hines, ANP-BC,  September 24, 2010 5:39 PM

## 2011-01-24 NOTE — Assessment & Plan Note (Signed)
Summary: rov   Visit Type:  Follow-up Primary Provider:  Clementeen Graham  CC:  dizziness.  History of Present Illness: Chad [name] is a very pleasant 50 year old male with history of coronary artery disease status post previous myocardial infarction and bypass surgery in 2006.  He also has a history congestive heart failure secondary to ischemic cardiomyopathy with EF in 35% range. He is s/p single chamber ICD. 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 He underwent emergent repair of proximal aorta and reimplantation of his CABG grafts which were patent on pre-op cath.  Was previously on spironolactone and ACE-I  but stopped due to renal insufficiency after dissection.   Had Myoview in 11/10: EF 31% with dilated ventricle.  Inferior wall infarct from apex to base with mild anteroapical ischemia. 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 Cartids u/s 0-39% bilaterally with highly vascular lesion in left lobe.   He returns for f/u. Recently admitted to [facility] with episode of syncope thought to be due to orthostasis. ICD interrogated and was OK without events. Hydralazine cut back from 100-50-100 to 50-50-50. Lasix stopped.  Now starting to feel better but still getting dizzy spells every day. Worse when standing up. No further syncope. No CP. Occasional SOB with walking. Still very fatigued.  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  Current Medications (verified): 1)  Digoxin 0.25 Mg Tabs (Digoxin) .... Take One Tablet By Mouth Daily 2)  Aspirin 81 Mg Tbec (Aspirin) .... Take One Tablet By Mouth Daily 3)  Carvedilol 25 Mg Tabs (Carvedilol) .... Take One Tablet By Mouth Twice A Day 4)  Lasix 80 Mg Tabs (Furosemide) .... As Needed 5)  Potassium Chloride Crys Cr 20 Meq Cr-Tabs (Potassium Chloride Crys Cr) .... Take With Lasix Take At Least 1  A Mth 6)  Amlodipine Besylate 5 Mg Tabs (Amlodipine Besylate) .... Take One Tablet By Mouth Daily 7)  Hydralazine Hcl 50 Mg Tabs (Hydralazine Hcl) .... Take One Tablet By Mouth Three Times A Day 8)  Hydrocodone-Acetaminophen 10-325 Mg Tabs (Hydrocodone-Acetaminophen) .... 4-6 Hrs As Needed 9)  Amoxicillin 500 Mg Caps (Amoxicillin) .... Take 4 Capsules 1 Hr Before Dental Appt 10)  Isosorbide Mononitrate Cr 30 Mg Xr24h-Tab (Isosorbide Mononitrate) .... Take One Tablet By Mouth Daily 11)  Simvastatin 40 Mg Tabs (Simvastatin) .... Take One Tablet By Mouth Daily At Bedtime  Allergies (verified): 1)  ! Sulfa 2)  ! * Ivp Dye 3)  ! * Latex 4)  ! * Atorvastatin 5)  ! * Shellfish 6)  * Contrast Dye  Past History:  Past Medical History: Last updated: 03/24/2010 CAD s/p CABG 2006    --myoview 11/10: EF 31%  Inferior wall infarct from apex to base with mild anteroapical ischemia Congestive Heart Failure EF=25-30%    --s/p ICD Hypertension Hyperlipidemia Pancreatitis Arthritis H/o noncompliance Type I aortic dissection involvong great vessels and L renal     --s/p emergent repair 6/10 Chronic back pain s/p several back surgeries CRI     --with atrophic L kidney due to dissection L left lobe thyroid lesion  Vital Signs:  Patient profile:   50 year old male Height:      71.5 inches Weight:      181 pounds BMI:     24.98 Pulse rate:   73 / minute BP sitting:  138 / 82  (right arm) Cuff size:   regular  Vitals Entered By: Hardin Negus, RMA (May 03, 2010 10:17 AM)   Physical Exam  General:  Fatiguedl appearing. No acute distress. HEENT: normal Neck: supple. no JVD. Carotids 2+ bilat; bilat bruits R>L. No lymphadenopathy or thryomegaly appreciated. Cor: PMI laterally displaced. Regular rate & rhythm. No rubs, gallops, 2/6 AS murmur. S2 ok. soft mitral murmur. Large chest wall scar.  Lungs: clear Abdomen: soft, nontender, nondistended. No hepatosplenomegaly. No bruits or masses.  Good bowel sounds. Extremities: no cyanosis, clubbing, rash, edema.  Neuro: alert & orientedx3, cranial nerves grossly intact.      ICD Specifications Following MD:  Lewayne Bunting, MD     Referring MD:  [facility] ICD Vendor:  [facility] [employer]     ICD Model Number:  T175     ICD Serial Number:  [id number] ICD DOI:  11/24/2005     ICD Implanting MD:  Lewayne Bunting, MD  Lead 1:    Location: RV     DOI: 11/24/2005     Model #: [id number]     Serial #: [id number]     Status: active  Indications::  ICM   ICD Follow Up ICD Dependent:  No      Brady Parameters Mode VVI     Lower Rate Limit:  40      Tachy Zones VF:  250     VT:  210     VT1:  185     Impression & Recommendations:  Problem # 1:  CAD, ARTERY BYPASS GRAFT (ICD-414.04) Stable. No evidence of ischemia. Continue current regimen.  Problem # 2:  ORTHOSTATIC HYPOTENSION (ICD-458.0) Improved with med changes. Use lasix only as needed. Can cut amlodipine to 2.5 once daily as needed.  Problem # 3:  SYSTOLIC HEART FAILURE, CHRONIC (ICD-428.22) Stable. Volume status looks good. NYHA II. IF BP improving would hope to re-initiate ACE-I carefully in future watching renal function very closely with solitary kidney.  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 Cyndie Chime in oncology. Recommends referring to Dr. Gerrit Friends. We will contact.  Other Orders: EKG w/ Interpretation (93000) Misc. Referral (Misc. Ref)  Patient Instructions: 1)  Only use Furosemide as needed  2)  If dizziness continues you may decrease Amlodipine to 2.5mg  daily 3)  You have been referred to Dr Darnell Level 4)  Follow up in 3 months

## 2011-01-24 NOTE — Assessment & Plan Note (Signed)
Summary: per check out/also need pacer checked/saf   Visit Type:  Follow-up Primary Provider:  Clementeen Graham  CC:  chest pain (lite).  History of Present Illness: Chad [name] is a very pleasant 50 year old male with history of coronary artery disease status post previous myocardial infarction and bypass surgery in 2006.  He also has a history congestive heart failure secondary to ischemic cardiomyopathy with EF in 35% range. He is s/p single chamber ICD. 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 He underwent emergent repair of proximal aorta and reimplantation of his CABG grafts which were patent on pre-op cath.  Was previously on spironolactone and ACE-I  but stopped due to renal insufficiency after dissection. Hydralazine had to be cut back earlier this year due to presyncope and orthostasis.  Had Myoview in 11/10: EF 31% with dilated ventricle.  Inferior wall infarct from apex to base with mild anteroapical ischemia. 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 Cartids u/s 0-39% bilaterally with highly vascular lesion in left thyroid lobe. Biopsy showed Hurthle cell lesion. Saw [name] who recommended resection but Tamer opted to defer.   Has good days and bad days. Overall feeling OK. SBP 125-155 (mostly 140-150s). Has CP 1-2x/week typically mild.   ICD interrogated today and no events.   Current Medications (verified): 1)  Digoxin 0.25 Mg Tabs (Digoxin) .... Take One Tablet By Mouth Daily 2)  Carvedilol 25 Mg Tabs (Carvedilol) .... Take One Tablet By Mouth Twice A Day 3)  Lasix 80 Mg Tabs (Furosemide) .... As Needed 4)  Potassium Chloride Crys Cr 20 Meq Cr-Tabs (Potassium Chloride Crys Cr) .... Take With Lasix Take At Least 1 A Mth 5)  Amlodipine Besylate 5 Mg Tabs (Amlodipine Besylate) .... Take One Tablet By Mouth Daily 6)  Hydralazine Hcl 50 Mg Tabs (Hydralazine Hcl) .... Take  One Tablet By Mouth Three Times A Day 7)  Hydrocodone-Acetaminophen 10-325 Mg Tabs (Hydrocodone-Acetaminophen) .... 4-6 Hrs As Needed 8)  Amoxicillin 500 Mg Caps (Amoxicillin) .... Take 4 Capsules 1 Hr Before Dental Appt 9)  Isosorbide Mononitrate Cr 30 Mg Xr24h-Tab (Isosorbide Mononitrate) .... Take One Tablet By Mouth Daily 10)  Vitamin B Complex-C   Caps (B Complex-C) .... Once Daily  Allergies (verified): 1)  ! Sulfa 2)  ! * Ivp Dye 3)  ! * Latex 4)  ! * Atorvastatin 5)  ! * Shellfish 6)  * Contrast Dye  Review of Systems       As per HPI and past medical history; otherwise all systems negative.   Vital Signs:  Patient profile:   50 year old male Height:      71.5 inches Weight:      181 pounds BMI:     24.98 Pulse rate:   74 / minute BP sitting:   154 / 76  (right arm) Cuff size:   regular  Vitals Entered By: Hardin Negus, RMA (July 28, 2010 10:20 AM)  Physical Exam  General:  well appearing. No acute distress. HEENT: normal Neck: supple. no JVD. Carotids 2+ bilat; bilat bruits R>L. No lymphadenopathy or thryomegaly appreciated. Cor: PMI laterally displaced. Regular rate & rhythm. No rubs, gallops, 2/6 AS murmur. S2 ok. soft mitral murmur. Large chest wall scar.  Lungs: clear Abdomen: soft, nontender, nondistended. No hepatosplenomegaly. No bruits or masses. Good bowel sounds. Extremities: no cyanosis, clubbing, rash, edema.  Neuro: alert &  orientedx3, cranial nerves grossly intact.      ICD Specifications Following MD:  Lewayne Bunting, MD     Referring MD:  [facility] ICD Vendor:  [facility] [employer]     ICD Model Number:  T175     ICD Serial Number:  [id number] ICD DOI:  11/24/2005     ICD Implanting MD:  Lewayne Bunting, MD  Lead 1:    Location: RV     DOI: 11/24/2005     Model #: [id number]     Serial #: [id number]     Status: active  Indications::  ICM   ICD Follow Up ICD Dependent:  No      Episodes Coumadin:  No  Brady Parameters Mode VVI     Lower Rate Limit:   40      Tachy Zones VF:  250     VT:  210     VT1:  185     Impression & Recommendations:  Problem # 1:  CAD, ARTERY BYPASS GRAFT (ICD-414.04) Stable with some mild chronic angina. Continue current regimen.  Problem # 2:  HYPERTENSION, BENIGN (ICD-401.1) BP still mildly elevated. Will add back lisinopril 10 and watch renal function closely.  Problem # 3:  HYPERLIPIDEMIA-MIXED (ICD-272.4) Goal LDL < 70. Continue statin. Check lipids/liver panel.  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 Melvenia Needles for resection.   Problem # 5:  SYSTOLIC HEART FAILURE, CHRONIC (ICD-428.22) NYHA class II. Volume status looks good. Will attempt to re-introduce lisinopril 10mg  once daily. Watch renal function closely with solitary kidney.   Other Orders: EKG w/ Interpretation (93000)  Patient Instructions: 1)  Your physician recommends that you return for a FASTING lipid, liver, and bmet profile: tomorrow (414.01, 428.22) 2)  Your physician recommends that you return for lab work in: 1 week (bmet, bnp 428.22) 3)  I will call you with your lab results and if ok we will start you on LIsinopril 10mg  daily 4)  Follow up in 2 months

## 2011-01-24 NOTE — Letter (Signed)
[facility]   Imported By: Marylou Mccoy 09/21/2010 11:09:06  _____________________________________________________________________  External Attachment:    Type:   Image     Comment:   External Document

## 2011-01-24 NOTE — Letter (Signed)
Summary: [facility] Office Visit   [facility] Office Visit   Imported By: Roderic Ovens 10/11/2010 11:23:22  _____________________________________________________________________  External Attachment:    Type:   Image     Comment:   External Document

## 2011-01-24 NOTE — Cardiovascular Report (Signed)
Summary: Office Visit   Office Visit   Imported By: Roderic Ovens 08/01/2010 15:12:20  _____________________________________________________________________  External Attachment:    Type:   Image     Comment:   External Document

## 2011-01-24 NOTE — Letter (Signed)
[facility]: [facility]   Imported By: Marylou Mccoy 09/21/2010 10:55:53  _____________________________________________________________________  External Attachment:    Type:   Image     Comment:   External Document

## 2011-01-24 NOTE — Assessment & Plan Note (Signed)
Summary: 3wk f/u sl   Visit Type:  Follow-up Primary Provider:  [facility]  CC:  chest pain with walking / fatigue.  History of Present Illness: Chad [name] is a very pleasant 50 year old male with history of coronary artery disease status post previous myocardial infarction and bypass surgery in 2006.  He also has a history congestive heart failure secondary to ischemic cardiomyopathy with EF in 35% range. He is s/p single chamber ICD. 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 He underwent emergent repair of proximal aorta and reimplantation of his CABG grafts which were patent on pre-op cath.  Was previously on spironolactone and ACE-I  but stopped due to renal insufficiency after dissection.   Had Myoview in 11/10: EF 31% with dilated ventricle.  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 Cartids u/s 0-39% bilaterally with highly vascular lesion in left lobe.   He returns for f/u. Overall feels he is getting weaker. Feels ok if walking on flat ground but if walks uphill he gets chest tightness and SOB. Worse than before. No edema. No orthopnea/PND. Still having pain in his back and legs.   Not sure he is getting all of his medicines correctly from pharamacy. SBPs now drifitn up into 160 range.   Current Medications (verified): 1)  Digoxin 0.25 Mg Tabs (Digoxin) .... Take One Tablet By Mouth Daily 2)  Aspirin 81 Mg Tbec (Aspirin) .... Take One Tablet By Mouth Daily 3)  Carvedilol 25 Mg Tabs (Carvedilol) .... Take One Tablet By Mouth Twice A Day 4)  Lasix 80 Mg Tabs (Furosemide) .... As Needed 5)  Potassium Chloride Crys Cr 20 Meq Cr-Tabs (Potassium Chloride Crys Cr) .... As Needed -- At Least 1 A Mth 6)  Amlodipine Besylate 5 Mg Tabs (Amlodipine Besylate) .... Take One Tablet By Mouth Daily ???? 7)   Hydralazine Hcl 50 Mg Tabs (Hydralazine Hcl) .... Take One Tablet By Mouth Three Times A Day 8)  Hydrocodone-Acetaminophen 10-325 Mg Tabs (Hydrocodone-Acetaminophen) .... 4-6 Hrs As Needed 9)  Amoxicillin 500 Mg Caps (Amoxicillin) .... Take 4 Capsules 1 Hr Before Dental Appt 10)  Isosorbide Mononitrate Cr 30 Mg Xr24h-Tab (Isosorbide Mononitrate) .... Take One Tablet By Mouth Daily ????  Allergies (verified): 1)  ! Sulfa 2)  ! * Ivp Dye 3)  ! * Latex 4)  ! * Atorvastatin 5)  ! 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   Vital Signs:  Patient profile:   50 year old male Height:      73 inches Weight:      181 pounds BMI:     23.97 Pulse rate:   75 / minute BP sitting:   160 / 80  (left  arm) Cuff size:   regular  Vitals Entered By: Hardin Negus, RMA (March 24, 2010 10:45 AM)  Physical Exam  General:  Well appearing. No acute distress. HEENT: normal Neck: supple. no JVD. Carotids 2+ bilat; bilat bruits R>L. No lymphadenopathy or thryomegaly appreciated. Cor: PMI laterally displaced. Regular rate & rhythm. No rubs, gallops, 2/6 AS murmur. S2 ok. soft mitral murmur. Large chest wall scar.  Lungs: clear Abdomen: soft, nontender, nondistended. No hepatosplenomegaly. No bruits or masses. Good bowel sounds. Extremities: no cyanosis, clubbing, rash, edema.  Neuro: alert & orientedx3, cranial nerves grossly  intact.     ICD Specifications Following MD:  Lewayne Bunting, MD     Referring MD:  [facility] ICD Vendor:  [facility] [employer]     ICD Model Number:  T175     ICD Serial Number:  [id number] ICD DOI:  11/24/2005     ICD Implanting MD:  Lewayne Bunting, MD  Lead 1:    Location: RV     DOI: 11/24/2005     Model #: [id number]     Serial #: [id number]     Status: active  Indications::  ICM   ICD Follow Up ICD Dependent:  No      Brady Parameters Mode VVI     Lower Rate Limit:  40      Tachy Zones VF:  250     VT:  210     VT1:  185     Impression & Recommendations:  Problem # 1:  SYSTOLIC HEART FAILURE, CHRONIC (ICD-428.22) Chronic NYHA class III symptoms. Volume status looks good. Continue to hold ace-i and spiro due to renal insuff may try and restart slowly soon. Continue hydralazine ntirates and b-blocker. May need CPX. Arrange home health CHF f/u.   Problem # 2:  CAD, ARTERY BYPASS GRAFT (ICD-414.04) I suspect his exertional symptoms may be more due to HF than angina but although myoview without significant ischemia likely has small unrevsacularized areas. Have suggested regular exercise program. Cantitrate indur as tolerated.  Problem # 3:  HYPERTENSION, BENIGN (ICD-401.1) Long talk about importance of tight BP control as he now only has 1 kidney and at higher risk for HD. Goal SBP < 130. Restart meds. F/u 1 month.  Problem # 4:  Thyroid lesion I spoke with Interventional Radiology will plan thryoid u/s with FNA>  Other Orders: Misc. Referral (Misc. Ref)  Patient Instructions: 1)  Thyroid Ultrasound 2)  Follow up in 1 month  Appended Document: 3wk f/u sl called [facility] on [address] and clarified all meds   Clinical Lists Changes  Medications: Added new medication of SIMVASTATIN 40 MG TABS (SIMVASTATIN) Take one tablet by mouth daily at bedtime - Signed Changed medication from AMLODIPINE BESYLATE 5 MG TABS (AMLODIPINE BESYLATE) Take one tablet by mouth daily ???? to AMLODIPINE BESYLATE 5 MG  TABS (AMLODIPINE BESYLATE) Take one tablet by mouth daily - Signed Changed medication from ISOSORBIDE MONONITRATE CR 30 MG XR24H-TAB (ISOSORBIDE MONONITRATE) Take one tablet by mouth daily ???? to ISOSORBIDE MONONITRATE CR 30 MG XR24H-TAB (ISOSORBIDE MONONITRATE) Take one tablet by mouth daily - Signed Rx of SIMVASTATIN 40 MG TABS (SIMVASTATIN) Take one tablet by mouth daily at bedtime;  #30 x 12;  Signed;  Entered by: Meredith Staggers, RN;  Authorized by: Dolores Patty, MD, [facility];  Method used: Electronically to [facility] [address] [place], [place], [place]  [address], Ph: [phone], Fax: [phone] Rx of AMLODIPINE BESYLATE 5 MG TABS (AMLODIPINE BESYLATE)  Take one tablet by mouth daily;  #30 x 12;  Signed;  Entered by: Meredith Staggers, RN;  Authorized by: Dolores Patty, MD, [facility];  Method used: Electronically to [facility] [address] [address] [place], [place], [place]  [address], Ph: [phone], Fax: [phone] Rx of ISOSORBIDE MONONITRATE CR 30 MG XR24H-TAB (ISOSORBIDE MONONITRATE) Take one tablet by mouth daily;  #30 x 12;  Signed;  Entered by: Meredith Staggers, RN;  Authorized by: Dolores Patty, MD, [facility];  Method used: Electronically to [facility] [place], [place], [place]  [address], Ph: [phone], Fax: [phone] Rx of CARVEDILOL 25 MG TABS (CARVEDILOL) Take one tablet by mouth twice a day;  #60 x 12;  Signed;  Entered by: Meredith Staggers, RN;  Authorized by: Dolores Patty, MD, [facility];  Method used: Electronically to [facility] [address] [address], [place], [place], [place]  [address], Ph: [phone], Fax: [phone] Rx of LASIX 80 MG TABS (FUROSEMIDE) as needed;  #30 x 6;  Signed;  Entered by: Meredith Staggers, RN;  Authorized by: Dolores Patty, MD, [facility];  Method used: Electronically to [facility] [address] [place], [place], [place]  [address], Ph: [phone], Fax: [phone] Rx  of POTASSIUM CHLORIDE CRYS CR 20 MEQ CR-TABS (POTASSIUM CHLORIDE CRYS CR) as needed -- at least 1 a mth;  #30 x 6;  Signed;  Entered by: Meredith Staggers, RN;  Authorized by: Dolores Patty, MD, [facility];  Method used: Electronically to [facility] [address] [place], [place], [place]  [address], Ph: [phone], Fax: [phone] Rx of HYDRALAZINE HCL 50 MG TABS (HYDRALAZINE HCL) Take one tablet by mouth three times a day;  #90 x 12;  Signed;  Entered by: Meredith Staggers, RN;  Authorized by: Dolores Patty, MD, [facility];  Method used: Electronically to [facility] [address] [place], [place], [place]  [address], Ph: [phone], Fax: [phone]    Prescriptions: HYDRALAZINE HCL 50 MG TABS (HYDRALAZINE HCL) Take one tablet by mouth three times a day  #90 x 12   Entered by:   Meredith Staggers, RN   Authorized by:   Dolores Patty, MD, [facility]   Signed by:   Meredith Staggers, RN on 03/24/2010   Method used:   Electronically to        [facility] (retail)       [address]       [place], [place]  [address]       Ph: [phone]       Fax: [phone]   RxID:   [id number] POTASSIUM CHLORIDE CRYS CR 20 MEQ CR-TABS (POTASSIUM CHLORIDE CRYS CR) as needed -- at least 1 a mth  #30 x 6   Entered by:   Meredith Staggers, RN   Authorized by:   Dolores Patty, MD, [facility]   Signed by:   Meredith Staggers, RN on 03/24/2010   Method used:   Electronically to        [facility] (retail)       [address]       [place], [place]  [address]       Ph: [phone]       Fax: [phone]   RxID:   [id number] LASIX 80 MG TABS (FUROSEMIDE) as needed  #  30 x 6   Entered by:   Meredith Staggers, RN   Authorized by:   Dolores Patty, MD, [facility]   Signed by:   Meredith Staggers, RN on 03/24/2010   Method used:   Electronically to        [facility] (retail)       [address]       [place], [place]  [address]       Ph: [phone]       Fax: [phone]   RxID:   [id number] CARVEDILOL 25 MG TABS (CARVEDILOL) Take one tablet by mouth twice a day  #60 x 12   Entered by:   Meredith Staggers, RN   Authorized by:   Dolores Patty, MD, [facility]   Signed by:   Meredith Staggers, RN on 03/24/2010   Method used:   Electronically to        [facility] (retail)       [address]       [place], [place]  [address]       Ph: [phone]       Fax: [phone]   RxID:   [id number] ISOSORBIDE MONONITRATE CR 30 MG XR24H-TAB (ISOSORBIDE MONONITRATE) Take one tablet by mouth daily  #30 x 12   Entered by:   Meredith Staggers, RN   Authorized by:   Dolores Patty, MD, [facility]   Signed by:   Meredith Staggers, RN on 03/24/2010   Method used:   Electronically to        [facility] (retail)       [address]       [place], [place]  [address]       Ph: [phone]       Fax: [phone]   RxID:   [id number] AMLODIPINE BESYLATE 5 MG TABS (AMLODIPINE BESYLATE) Take one tablet by mouth daily  #30 x 12   Entered by:   Meredith Staggers, RN   Authorized by:   Dolores Patty, MD, [facility]   Signed by:   Meredith Staggers, RN on 03/24/2010   Method used:   Electronically to        [facility] (retail)       [address]       [place], [place]  [address]       Ph: [phone]       Fax: [phone]   RxID:   [id number] SIMVASTATIN 40 MG TABS (SIMVASTATIN) Take one tablet by mouth daily at bedtime  #30 x 12   Entered by:   Meredith Staggers, RN   Authorized by:   Dolores Patty, MD, [facility]   Signed by:   Meredith Staggers, RN on 03/24/2010   Method used:   Electronically to        [facility] (retail)       [address]       [place], [place]  [address]       Ph: [phone]       Fax: [phone]   RxID:   [id number]

## 2011-01-24 NOTE — Letter (Signed)
Summary: [facility]   Imported By: Clydell Hakim 09/13/2010 12:07:29  _____________________________________________________________________  External Attachment:    Type:   Image     Comment:   External Document

## 2011-01-24 NOTE — Procedures (Signed)
Summary: Cardiology Device Clinic   Allergies: 1)  ! Sulfa 2)  ! * Ivp Dye 3)  ! * Latex 4)  ! * Atorvastatin 5)  ! * Shellfish 6)  * Contrast Dye   ICD Specifications Following MD:  Lewayne Bunting, MD     Referring MD:  [facility] ICD Vendor:  [employer]     ICD Model Number:  T175     ICD Serial Number:  [id number] ICD DOI:  11/24/2005     ICD Implanting MD:  Lewayne Bunting, MD  Lead 1:    Location: RV     DOI: 11/24/2005     Model #: [id number]     Serial #: [id number]     Status: active  Indications::  ICM   ICD Follow Up Remote Check?  No Battery Voltage:  2.63 V     Charge Time:  10.8 seconds     Battery Est. Longevity:  MOL2 ICD Dependent:  No       ICD Device Measurements Right Ventricle:  Amplitude: 13.4 mV, Impedance: 444 ohms, Threshold: 1.0 V at 0.441 msec  Episodes Coumadin:  No Shock:  0     ATP:  0     Nonsustained:  0     Ventricular Pacing:  0%  Brady Parameters Mode VVI     Lower Rate Limit:  40      Tachy Zones VF:  250     VT:  210     VT1:  185     Next Cardiology Appt Due:  07/25/2010 Tech Comments:  No parameter changes.  Device function normal.  ERI tones demonstrated.  ROV 3 months clinic. Altha Harm, LPN  May 03, 2010 12:16 PM  MD Comments:  Agree with above.

## 2011-01-24 NOTE — Miscellaneous (Signed)
Summary: [facility]   [facility]   Imported By: Roderic Ovens 11/29/2010 12:35:59  _____________________________________________________________________  External Attachment:    Type:   Image     Comment:   External Document

## 2011-01-24 NOTE — Progress Notes (Signed)
Summary: ORDERS FOR HOME CARE  Phone Note From Other Clinic Call back at [phone]   Caller: HOME CARE Call For: Dalyla Chui Summary of Call: HOME CARE NEEDS MORE ORDERS TO CONTINUE TO SEE PATIENT-WILL TAKE VERBAL ORDERS AT [phone] Initial call taken by: Harlon Flor,  May 24, 2010 9:24 AM  Follow-up for Phone Call        Flag sent to Dr. Buren Kos, RN  May 25, 2010 4:13 PM   gave order to extend service for a couple more weeks Meredith Staggers, RN  May 26, 2010 11:36 AM

## 2011-01-24 NOTE — Progress Notes (Signed)
Summary: er follow up  Phone Note Call from Patient Call back at Home Phone [phone]   Caller: Patient Summary of Call: seen in [facility] ER on 5/6, states he needs to follow up in 3 days Initial call taken by: Migdalia Dk,  May 02, 2010 10:53 AM  Follow-up for Phone Call        spoke w/pt sch appt for 5/10 at 10am w/Dr Bensimhon Meredith Staggers, RN  May 02, 2010 11:40 AM

## 2011-01-24 NOTE — Assessment & Plan Note (Signed)
Summary: hosp f/u/[facility]/corey   Vital Signs:  Patient profile:   50 year old male Height:      71.5 inches Weight:      180 pounds BMI:     24.84 Temp:     98.3 degrees F oral BP sitting:   144 / 82  (right arm) Cuff size:   regular  Vitals Entered By: Tessie Fass CMA (August 31, 2010 3:11 PM) CC: hospital f/u Is Patient Diabetic? No Pain Assessment Patient in pain? yes        Primary Care Provider:  Clementeen Graham  CC:  hospital f/u.  History of Present Illness: Chad [name] was recently discahrged from the hospital for NSTEMI. He has PCA cath and placement of drug eluding stent. In the interum he feels well. His medications were updated. He however is taking sumvistatin when he eats meat but not every night.  See ROS He does note however leg and back soreness since leaving the hospital. He also notes associated fatigue. He cannot think of anything he may have done to bring this on. He feels OK otherwise.   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 Take One Tablet By Mouth Daily 2)  Carvedilol 25 Mg Tabs (Carvedilol) .... Take One Tablet By Mouth Twice A Day 3)  Lasix 80 Mg Tabs (Furosemide) .... As Needed 4)  Potassium Chloride Crys Cr 20 Meq Cr-Tabs (Potassium Chloride Crys Cr) .... Take With Lasix Take At Least 1 A  Mth 5)  Amlodipine Besylate 5 Mg Tabs (Amlodipine Besylate) .... Take One Tablet By Mouth Daily 6)  Hydralazine Hcl 50 Mg Tabs (Hydralazine Hcl) .... Take One Tablet By Mouth Three Times A Day 7)  Hydrocodone-Acetaminophen 10-325 Mg Tabs (Hydrocodone-Acetaminophen) .... 4-6 Hrs As Needed 8)  Amoxicillin 500 Mg Caps (Amoxicillin) .... Take 4 Capsules 1 Hr Before Dental Appt 9)  Isosorbide Mononitrate Cr 30 Mg Xr24h-Tab (Isosorbide Mononitrate) .... Take One Tablet By Mouth Daily 10)  Vitamin B Complex-C   Caps (B Complex-C) .... Once Daily 11)  Nitrostat 0.4 Mg Subl (Nitroglycerin) 12)  Aspirin 81 Mg Chew (Aspirin) 13)  Simvastatin 40 Mg Tabs (Simvastatin) .[name].. 1 By Mouth Qhs 14)  Plavix 75 Mg Tabs (Clopidogrel Bisulfate) .[name].. 1 By Mouth Daily  Allergies (verified): 1)  ! Sulfa 2)  ! * Ivp Dye 3)  ! * Latex 4)  ! * Atorvastatin 5)  ! * Shellfish 6)  * Contrast Dye  Past History:  Past Medical History: CAD s/p CABG 2006    --myoview 11/10: EF 31%  Inferior wall infarct from apex to base with mild anteroapical ischemia  -- Cath 07/2010 : LAD stenosis with DES placement. 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 Father:  Chad [name] with sudden death at 53 years Family History of Sudden Cardiac Death:   Social History: Reviewed history from 09/29/2008 and no changes required. Disabled  Married  Tobacco Use - No.  Alcohol Use - no  Review of Systems  The patient denies anorexia, fever, weight loss, chest pain, syncope, dyspnea on exertion, headaches, abdominal pain, severe indigestion/heartburn, muscle weakness,  suspicious skin lesions, difficulty walking, depression, and enlarged lymph nodes.    Physical Exam  General:  Vs noted.  Well NAD Mouth:  Teeth, gums and palate normal. Oral mucosa normal. Lungs:  Normal respiratory effort, chest expands symmetrically. Lungs are clear to auscultation, no crackles or wheezes. Heart:  RRR   PMI is enlarged and laterally displaced. 3/6 systolic murmur at the LSB with radiation to the carrotids.  Extremities:  no edema   Impression & Recommendations:  Problem # 1:  SYSTOLIC HEART FAILURE, CHRONIC (ICD-428.22)  Medications updated.  No changes needed today. Will continue to follow. Has appt with cardiologist on 9.16. Will follow up report.  His updated medication list for this problem includes:    Digoxin 0.25 Mg Tabs (Digoxin) .[name]... Take one tablet by mouth daily    Carvedilol 25 Mg Tabs (Carvedilol) .[name]... Take one tablet by mouth twice a day    Lasix 80 Mg Tabs (Furosemide) .[name]... As needed    Aspirin 81 Mg Chew (Aspirin)    Plavix 75 Mg Tabs (Clopidogrel bisulfate) .[name]... 1 by mouth daily  Echocardiogram:  Study Conclusions    1. Left ventricle: The cavity size was normal. There was moderate      concentric hypertrophy. Systolic function was moderately to      severely reduced. The estimated ejection fraction was in the      range of 30% to 35%. Diffuse hypokinesis. Doppler parameters are      consistent with abnormal left ventricular relaxation (grade 1      diastolic dysfunction).   2. Mitral valve: Mild regurgitation.   3. Right ventricle: Systolic function was moderately reduced.   4. Atrial septum: There was an atrial septal aneurysm.   5. Pulmonary arteries: PA peak pressure: 31mm Hg (S).   Impressions:    - Mildly elevated velocity in aortic arch.   Echocardiography. M-mode, complete 2D, spectral Doppler, and color   Doppler. Patient status: Inpatient. Location: ICU/CCU    Olga Millers, MD, [facility] (07/01/2009)  Orders: [facility]- Est  Level  3 (04540)  Problem # 2:  HYPERLIPIDEMIA-MIXED (ICD-272.4) Pt's LDL 103. His goal is less than 70. However max dose of simvistatin with norvasc is 20mg . Pt not taking statin reliably. Plan to have pt take 1/2 tab nightly.  Concern that muscle soreness may be related to myalgias from statin.  Will check BMP and CK.   His updated medication list for this problem includes:    Simvastatin 40 Mg Tabs (Simvastatin) .[name]... 1 by mouth qhs  Labs Reviewed: SGOT: 19 (08/02/2010)   SGPT: 15 (08/02/2010)   HDL:60 (08/17/2010), 42.60 (08/02/2010)  LDL:103 (08/17/2010), 112 (08/02/2010)  Chol:183 (08/02/2010), 212 (01/14/2008)  Trig:59 (08/17/2010), 141.0 (08/02/2010)  Problem # 3:  RENAL INSUFFICIENCY (ICD-588.9) Assessment: Unchanged Checking BMP Orders: Basic Met-[facility] (0011001100) CK (Creatine Kinase)-[facility] (98119-14782)  Problem # 4:  HYPERTENSION, BENIGN (ICD-401.1)  Likely  is well controlled outside of the office. Will plan to follow BP in 1 month. Cardiology will adjust.   His updated medication list for this problem includes:    Carvedilol 25 Mg Tabs (Carvedilol) .[name]... Take one tablet by mouth twice a day    Lasix 80 Mg Tabs (Furosemide) .[name]... As needed    Amlodipine Besylate 5 Mg Tabs (Amlodipine besylate) .[name]... Take one tablet by mouth daily    Hydralazine Hcl 50 Mg Tabs (Hydralazine hcl) .[name]... Take one tablet by mouth three times a day  Orders: [facility]- Est Level  3 (04540)  Complete Medication List: 1)  Digoxin 0.25 Mg Tabs (Digoxin) .... Take one tablet by mouth daily 2)  Carvedilol 25 Mg Tabs (Carvedilol) .... Take one tablet by mouth twice a day 3)  Lasix 80 Mg Tabs (Furosemide) .... As needed 4)  Potassium Chloride Crys Cr 20 Meq Cr-tabs (Potassium chloride crys cr) .... Take with lasix take at least 1 a mth 5)  Amlodipine Besylate 5 Mg Tabs (Amlodipine besylate) .... Take one tablet by mouth daily 6)  Hydralazine Hcl 50 Mg Tabs (Hydralazine hcl) .... Take one tablet by mouth  three times a day 7)  Hydrocodone-acetaminophen 10-325 Mg Tabs (Hydrocodone-acetaminophen) .... 4-6 hrs as needed 8)  Amoxicillin 500 Mg Caps (Amoxicillin) .... Take 4 capsules 1 hr before dental appt 9)  Isosorbide Mononitrate Cr 30 Mg Xr24h-tab (Isosorbide mononitrate) .... Take one tablet by mouth daily 10)  Vitamin B Complex-c Caps (B complex-c) .... Once daily 11)  Nitrostat 0.4 Mg Subl (Nitroglycerin) 12)  Aspirin 81 Mg Chew (Aspirin) 13)  Simvastatin 40 Mg Tabs (Simvastatin) .[name].. 1 by mouth qhs 14)  Plavix 75 Mg Tabs (Clopidogrel bisulfate) .[name].. 1 by mouth daily  Patient Instructions: 1)  Thank you for seeing me today. 2)  If you have chest pain, difficulty breathing, fevers over 102 that does not get better with tylenol please call us or see a doctor.  3)  Take 1/2 Simvistatin pill at night every day. 4)  Keep taking your medicine.  5)  Take 2 baby aspirins and 1 plavix every day to thin the blood.  6)  Follow up with your cardiologist.  7)  Make an appointment with Dr. Katrinka Blazing to discuss back manipulation.    -  Date:  08/17/2010    NA 132    K 3.5    CREAT 1.44    LDL 103    HDL 60    TG 59    HGB 12.6    PLTS 162    Prevention & Chronic Care Immunizations   Influenza vaccine: Not documented   Influenza vaccine deferral: Not available  (08/31/2010)    Tetanus booster: Not documented    Pneumococcal vaccine: Not documented  Other Screening   PSA: Not documented   PSA action/deferral: Discussion deferred  (08/31/2010)   Smoking status: never  (08/31/2010)  Lipids   Total Cholesterol: 183  (08/02/2010)   LDL: 103  (08/17/2010)   LDL Direct: 120.1  (01/14/2008)   HDL: 60  (08/17/2010)   Triglycerides: 141.0  (08/02/2010)    SGOT (AST): 19  (08/02/2010)   SGPT (ALT): 15  (08/02/2010)   Alkaline phosphatase: 50  (08/02/2010)   Total bilirubin: 0.8  (08/02/2010)    Lipid flowsheet reviewed?: Yes   Progress toward LDL goal: Improved  Hypertension    Last Blood Pressure: 144 / 82  (08/31/2010)   Serum creatinine: 1.44  (08/17/2010)   Serum potassium 3.5  (  08/17/2010)    Hypertension flowsheet reviewed?: Yes   Progress toward BP goal: Deteriorated  Self-Management Support :   Personal Goals (by the next clinic visit) :      Personal blood pressure goal: 140/90  (08/31/2010)     Personal LDL goal: 100  (08/31/2010)    Patient will work on the following items until the next clinic visit to reach self-care goals:     Medications and monitoring: take my medicines every day, check my blood pressure, bring all of my medications to every visit  (08/31/2010)    Hypertension self-management support: Not documented    Lipid self-management support: Not documented

## 2011-01-24 NOTE — Progress Notes (Signed)
Summary: [facility]  Phone Note From Other Clinic Call back at [phone]   Caller: BILL WITH [facility] Call For: BENSIMHON Summary of Call: [id number] LIKE A CALL BACK ABOUT GETTING ORDERS Initial call taken by: Harlon Flor,  April 12, 2010 2:50 PM  Follow-up for Phone Call        spoke with Bill with [facility] in regards to pt not being an [facility] pt.  attempted to redirect him to [facility].  Bill insistant that all he needed was a verbal order to extend PT/OT to once a week until pt is stablized.  order given and Bill redirected to [facility] for further questions or needs.  Follow-up by: Charlena Cross, RN, BSN,  April 12, 2010 3:01 PM

## 2011-01-24 NOTE — Initial Assessments (Signed)
Summary: Chad [name] was discharge from ER   Primary Care Provider:  Clementeen Graham   History of Present Illness:  Chad [name] seen and examined. In brief Chad [name] is a 50 yo Male with significant cardiac hx including CABG 4 vessel, hx of dissecting aortic anueyrism, ICD placement with pacer for EF < 30%, who presents with a near syncopal episode.  Chad [name] states he was getting out of bed at 11am today and when attempted to sit up in one false swoop Chad [name] swinged his legs off the bed, and felt like he balcked out for a matter of seconds and ended up on the floor.  Chad [name] does not think he actually passed out completely and states no time went off the clock.  Chad [name] states he did not have any CP but he did have palpatations and maybe mild sob.  Chad [name] states that he has been feeling fatigue recently but does not states f/c/n/v/d/c.  Chad [name] since this episode has felt fine but was scared and came in to ED.  Chad [name] was supposed to see his cardiologist Dr. Adriana Reams today but missed appointment due to this episode.  Chad [name] has a good relationship with his cardiologist.    Chad [name] would like to go home if possible due to being a single father and would like to be with his daughter.     Current Medications (verified): 1)  Digoxin 0.25 Mg Tabs (Digoxin) .... Take One Tablet By Mouth Daily 2)  Aspirin 81 Mg Tbec (Aspirin) .... Take One Tablet By Mouth Daily 3)  Carvedilol 25 Mg Tabs (Carvedilol) .... Take One Tablet By Mouth Twice A Day 4)  Lasix 80 Mg Tabs (Furosemide) .... As Needed 5)  Potassium Chloride Crys Cr 20 Meq Cr-Tabs (Potassium Chloride Crys Cr) .... Take With Lasix Take At Least 1 A Mth 6)  Amlodipine Besylate 5 Mg Tabs (Amlodipine Besylate) .... Take One Tablet By Mouth Daily 7)  Hydralazine Hcl 50 Mg Tabs (Hydralazine Hcl) .... Take One Tablet By Mouth Three Times A Day 8)  Hydrocodone-Acetaminophen 10-325 Mg Tabs (Hydrocodone-Acetaminophen) .... 4-6 Hrs As Needed 9)  Amoxicillin 500 Mg Caps (Amoxicillin) .... Take 4 Capsules 1 Hr Before Dental  Appt 10)  Isosorbide Mononitrate Cr 30 Mg Xr24h-Tab (Isosorbide Mononitrate) .... Take One Tablet By Mouth Daily 11)  Simvastatin 40 Mg Tabs (Simvastatin) .... Take One Tablet By Mouth Daily At Bedtime  Allergies (verified): 1)  ! Sulfa 2)  ! * Ivp Dye 3)  ! * Latex 4)  ! * Atorvastatin 5)  ! 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  Alcohol Use - no  Risk Factors: Smoking Status: never (04/25/2010)  Review of Systems       denies fever, chills,  nausea, vomiting, diarrhea or constipation   Physical Exam  General:  VS noted. Well male in NAD Eyes:  PERRLA, EOMI Mouth:  Teeth, gums and palate normal. Oral mucosa normal. Lungs:  clear Heart:  RRR   PMI is enlarged and laterally displaced. 3/6 systolic murmur at the LSB with radiation to the carrotids.  Pulses:  pulses normal in all 4 extremities Extremities:  no edema Neurologic:  2-12 intact Orthostatics:  laying HR 78  BP 108/72 sitting HR 80 BP 112/75 Standing HR 88 BP 114/74 LABS:  UDS + for opiods 138/3.5107/2616/1.37<96 UA normal CBC: 4.3>13.1/38.5<157 Vitals  97.8/ 78/ 16/ 115/67 Head CT negative CXR- Stable postop changes from  previous CABG, no acute dx    Impression & Recommendations:  Problem # 1:  Near syncopal episode  Chad [name] has hx of hypertension and appears to be closer to hypotension.  Chad [name] has not had any change in medication but has been taking lasix consistently.  Chad [name] did not get lightheaded or symptomatic at all with orthostatics.  Chad [name] has been monitored for >4 hours in the ED and has had no events. Likely orthostatic initially.  Dr. Eden Emms was contacted from [facility] cardiology who put [place] in contact with makers of Mr. Gaster's ICD and wanted [place] to contact them for them to come out and check the ICD.  If there is no abnormal events noted Chad [name] can be discharged home with close f/u with cardiology and FM.  Chad [name] will also decrease his medication of hydralazine to half the amount.  If Chad [name] is doing much better continue this dose, if he still has symptoms stop it completely and then f/u with cardiology very early next week.    Joey from [employer] had ICD read and stated there were no events today or even in the last year.  At this point we will d/c home with close f/u next week.  We will decrease hydralazine to 25mg  by mouth three times a day and for Chad [name] to continue to check BP daily at home.  Chad [name] given red flags.    Complete Medication List: 1)  Digoxin 0.25 Mg Tabs (Digoxin) .... Take one tablet by mouth daily 2)  Aspirin 81 Mg Tbec (Aspirin) .... Take one tablet by mouth daily 3)  Carvedilol 25 Mg Tabs (Carvedilol) .... Take one tablet by mouth twice a day 4)  Lasix 80 Mg Tabs (Furosemide) .... As needed 5)  Potassium Chloride Crys Cr 20 Meq Cr-tabs (Potassium chloride crys cr) .... Take with lasix take at least 1 a mth 6)  Amlodipine Besylate 5 Mg Tabs (Amlodipine besylate) .... Take one tablet by mouth daily 7)  Hydralazine Hcl 50 Mg Tabs (Hydralazine hcl) .... Take one tablet by mouth three times a day 8)  Hydrocodone-acetaminophen 10-325 Mg Tabs (Hydrocodone-acetaminophen) .... 4-6 hrs as needed 9)  Amoxicillin  500 Mg Caps (Amoxicillin) .... Take 4 capsules 1 hr before dental appt 10)  Isosorbide Mononitrate Cr 30 Mg Xr24h-tab (Isosorbide mononitrate) .... Take one tablet by mouth daily 11)  Simvastatin 40 Mg Tabs (Simvastatin) .... Take one tablet by mouth daily at bedtime  Patient Instructions: 1)  Nice to meet you 2)  Your pacemaker looks good. 3)  I want you to cut your hydralazine in half.  Take 25mg  by mouth three times a day, if you still have symptoms quit taking it and then see your cardiologist 4)  Please follow up with your cardiologist early next week 5)  Check your BP daily.  6)  If you start to feel that same or your blood pressure is really high you should seek medical attention.   7)  You should probably follow up with Dr. Denyse Amass as well in the next week.

## 2011-01-24 NOTE — Cardiovascular Report (Signed)
Summary: Office Visit   Office Visit   Imported By: Roderic Ovens 05/10/2010 11:32:28  _____________________________________________________________________  External Attachment:    Type:   Image     Comment:   External Document

## 2011-01-24 NOTE — Assessment & Plan Note (Signed)
Summary: 3 month rov/device check also call paula or amber   CC:  f/u --- CP and SOB (constant).  History of Present Illness: Chad [name] is a very pleasant 50 year old male with history of coronary artery disease status post previous myocardial infarction and bypass surgery in 2006.  He also has a history congestive heart failure secondary to ischemic cardiomyopathy with EF in 35% range. He is s/p single chamber ICD. 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 He underwent emergent repair of proximal aorta and reimplantation of his CABG grafts which were patent on pre-op cath.  Was previously on spironolactone and ACE-I  but stopped due to renal insufficiency after dissection.   Had Myoview in 11/10: EF 31% with dilated ventricle.  Inferior wall infarct from apex to base with mild anteroapical ischemia  He returns for f/u. Admitted at end of November 2010 with CP. Thought to be non-cardiac. CE normal. Now CP worse. Having CP nearly every day. Worse with walking. + DOE. Relieved with NTG. No orthopnea or PND. BP improved systolics usually in the 120s. He gets very fatigued after taking meds. Not taking Imdur due to HAs in past. No ICD firings, suncope or palpitations. + back pain but improved.   Current Medications (verified): 1)  Simvastatin 40 Mg Tabs (Simvastatin) .... Take One Tablet By Mouth Daily At Bedtime 2)  Digoxin 0.25 Mg Tabs (Digoxin) .... Take One Tablet By Mouth Daily 3)  Aspirin 81 Mg Tbec (Aspirin) .... Take One Tablet By Mouth Daily 4)  Carvedilol 25 Mg Tabs (Carvedilol) .... Take One Tablet By Mouth Twice A Day 5)  Lasix 80 Mg Tabs (Furosemide) .... As Needed 6)  Potassium Chloride Crys Cr 20 Meq Cr-Tabs (Potassium Chloride Crys Cr) .... As Needed -- At Least 1 A Mth 7)  Amlodipine Besylate 5 Mg Tabs (Amlodipine Besylate) .... Take One Tablet By Mouth Daily 8)  Hydralazine Hcl 50 Mg Tabs (Hydralazine Hcl) ....  Take One Tablet By Mouth Three Times A Day 9)  Hydrocodone-Acetaminophen 10-325 Mg Tabs (Hydrocodone-Acetaminophen) .... 4-6 Hrs As Needed 10)  Amoxicillin 500 Mg Caps (Amoxicillin) .... Take 4 Capsules 1 Hr Before Dental Appt  Allergies (verified): 1)  ! Sulfa 2)  ! * Ivp Dye 3)  ! * Latex 4)  ! * Atorvastatin 5)  ! 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   Vital Signs:  Patient profile:   50 year old male Height:      73 inches Weight:      178 pounds BMI:     23.57 Pulse rate:   70 / minute BP sitting:   146 / 88  (left arm) Cuff size:   regular  Vitals Entered By: Hardin Negus, RMA (January 25, 2010 8:42 AM)  Physical Exam  General:  Well appearing. No acute distress. HEENT: normal Neck: supple. no JVD. Carotids 2+ bilat; bilat bruits  R>L. No lymphadenopathy or thryomegaly appreciated. Cor: PMI laterally displaced. Regular rate & rhythm. No rubs, gallops, 2/6 AS murmur. S2 ok. soft mitral murmur. Large chest wall scar.  Lungs: clear Abdomen: soft, nontender, nondistended. No hepatosplenomegaly. No bruits or masses. Good bowel sounds. Extremities: no cyanosis, clubbing, rash, edema.  Neuro: alert & orientedx3, cranial nerves grossly intact.     ICD Specifications Following MD:  Lewayne Bunting, MD     Referring MD:  [facility] ICD Vendor:  [facility] [employer]     ICD Model Number:  T175     ICD Serial Number:  [id number] ICD DOI:  11/24/2005     ICD Implanting MD:  Lewayne Bunting, MD  Lead 1:    Location: RV     DOI: 11/24/2005     Model #: [id number]     Serial #: [id number]     Status: active  Indications::  ICM   ICD Follow Up Remote Check?  No Battery Voltage:  2.65 V     Charge Time:  10.5 seconds     Underlying rhythm:  SR ICD Dependent:  No       ICD Device Measurements Right Ventricle:  Amplitude: 15 mV, Impedance: 468 ohms, Threshold: 0.6 V at 0.4 msec Shock Impedance: 42 ohms   Episodes Shock:  0     ATP:  0     Nonsustained:  0     Ventricular Pacing:  0%  Brady Parameters Mode VVI     Lower Rate Limit:  40      Tachy Zones VF:  250     VT:  210     VT1:  185     Next Cardiology Appt Due:  04/25/2010 Tech Comments:  Normal device function.  No changes made today.  Unable to explain patient's symptoms of SOB and fatigue through his device.  Histagrams appropriate.  ROV 3 months clinic. Gypsy Balsam RN BSN  January 25, 2010 8:44 AM   Impression & Recommendations:  Problem # 1:  CAD, ARTERY BYPASS GRAFT (ICD-414.04) He is having recurrent CP which sounds concerning for angina and Myoview suggests at least a partial defect. Ordinarily, I would proceed to cath at this point but given history of dissection this complicates matters. Will check u/s of aorta and subclavian to assess to see if descending  dissection has healed and whether we can go either from leg or left arm. I have d/w Dr. Excell Seltzer who will also review previous CT and operative report. Will add Imdur 30 for now and see back in 2-3 weeks.  Problem # 2:  CAROTID BRUIT (ICD-785.9) As above. Check u/s.  Problem # 3:  SYSTOLIC HEART FAILURE, CHRONIC (ICD-428.22) Overall doing fairly well. Volume status looks good. Tolerating hyrdralazine and coreg well. Would like to get him back on ACE-I or spiro in near future but will have to watch renal function very closlely. Will get renal u/s to see status of left kidney which was involved in the dissection.   Problem # 4:  HYPERTENSION, BENIGN  (ICD-401.1) BP well controlled at home. Mildly elevated here. Continue current regimen.   Other Orders: EKG w/ Interpretation (93000) Carotid Duplex (Carotid Duplex) Renal Artery Duplex (Renal Artery Duplex) Abdominal Aorta Duplex (Abd Aorta Duplex)  Patient Instructions: 1)  Start Imdur 30mg  daily 2)  Your physician has requested that you have an abdominal aorta duplex. During this test, an ultrasound is used to evaluate the aorta. Allow 30 minutes for this exam.  Do not eat after midnight the day before and avoid carbonated beverages. There are no restrictions or special instructions. 3)  Your physician has requested that you have a carotid duplex. This test is an ultrasound of the carotid arteries in your neck. It looks at blood flow through these arteries that supply the brain with blood. Allow one hour for this exam. There are no restrictions or special instructions. 4)  Your physician has requested that you have a renal artery duplex. During this test, an ultrasound is used to evaluate blood flow to the kidneys. Allow one hour for this exam. Do not eat after midnight the day before and avoid carbonated beverages. Take your medications as you usually do. 5)  Follow up in 2-3 weeks Prescriptions: ISOSORBIDE MONONITRATE CR 30 MG XR24H-TAB (ISOSORBIDE MONONITRATE) Take one tablet by mouth daily  #30 x 6   Entered by:   Meredith Staggers, RN   Authorized by:   Dolores Patty, MD, [facility]   Signed by:   Meredith Staggers, RN on 01/25/2010   Method used:   Electronically to        [facility] (retail)       [address]       [place], [place]  [address]       Ph: [phone]       Fax: [phone]   RxID:   [id number]

## 2011-01-24 NOTE — Miscellaneous (Signed)
  Clinical Lists Changes  Observations: Added new observation of CARDCATHFIND: IMPRESSION:   1. Severe three-vessel coronary artery disease with 3/5 patent grafts.   2. Subacute occlusion of the vein graft to the posterior descending       artery, which is likely the culprit vessel.   3. Successful percutaneous transluminal coronary angioplasty with       placement of a drug-eluting stent in the distal native right       coronary artery.      RECOMMENDATIONS:  The patient will be managed with optimal medical   therapy, which should include dual-antiplatelet therapy with aspirin and   Plavix for at least 1 year.  He should also be continued on aggressive   risk factor modification as well as a beta-blocker, statin medication,   and an ACE inhibitor.     (08/16/2010 11:25) Added new observation of CXR RESULTS:  Findings: The lungs are well-aerated and clear.  Mild vascular   congestion is noted.  There is no evidence of focal opacification,   pleural effusion or pneumothorax.    The cardiomediastinal silhouette is enlarged; the patient is status   post median sternotomy, with evidence of prior CABG.  An AICD is   noted overlying the left chest wall, with a single lead ending   overlying the right ventricle.  No acute osseous abnormalities are   seen.    IMPRESSION:   Mild vascular congestion and cardiomegaly noted; no evidence of   edema. (08/12/2010 11:26)      Cardiac Cath  Procedure date:  08/16/2010  Findings:      IMPRESSION:   1. Severe three-vessel coronary artery disease with 3/5 patent grafts.   2. Subacute occlusion of the vein graft to the posterior descending       artery, which is likely the culprit vessel.   3. Successful percutaneous transluminal coronary angioplasty with       placement of a drug-eluting stent in the distal native right       coronary artery.      RECOMMENDATIONS:  The patient will be managed with optimal medical   therapy, which should  include dual-antiplatelet therapy with aspirin and   Plavix for at least 1 year.  He should also be continued on aggressive   risk factor modification as well as a beta-blocker, statin medication,   and an ACE inhibitor.      CXR  Procedure date:  08/12/2010  Findings:       Findings: The lungs are well-aerated and clear.  Mild vascular   congestion is noted.  There is no evidence of focal opacification,   pleural effusion or pneumothorax.    The cardiomediastinal silhouette is enlarged; the patient is status   post median sternotomy, with evidence of prior CABG.  An AICD is   noted overlying the left chest wall, with a single lead ending   overlying the right ventricle.  No acute osseous abnormalities are   seen.    IMPRESSION:   Mild vascular congestion and cardiomegaly noted; no evidence of   edema.

## 2011-01-24 NOTE — Letter (Signed)
Summary: Dr Kem Kays Office Visit Note   Dr Kem Kays Office Visit Note   Imported By: Roderic Ovens 11/15/2010 12:41:55  _____________________________________________________________________  External Attachment:    Type:   Image     Comment:   External Document

## 2011-01-24 NOTE — Miscellaneous (Signed)
Summary: Home Health Certification/Care Plan  Home Health Certification/Care Plan   Imported By: Roderic Ovens 05/04/2010 16:35:54  _____________________________________________________________________  External Attachment:    Type:   Image     Comment:   External Document

## 2011-01-24 NOTE — Letter (Signed)
Summary: Appointment - Missed  [facility], Main Office  [address]   [place], [place] [address]   Phone: [phone]  Fax: [phone]     May 04, 2010 MRN: [id number]   MINORU CHAP [address] [place], [place]  [address]   Dear Mr. Weisenberger,  Our records indicate you missed your appointment on 04/29/10   with Dr. Daleen Squibb  .It is very important that we reach you to reschedule this appointment. We look forward to participating in your health care needs. Please contact us at the number listed above at your earliest convenience to reschedule this appointment.     Sincerely,  Lorne Skeens  [facility] Scheduling Team

## 2011-01-24 NOTE — Miscellaneous (Signed)
Summary: hosp f/u appt made  Clinical Lists Changes Chad [name] , SW called to see if he had a f/u appt yet. I called pt at [phone] & made him an appt with his pcp on 9/7. asked him to call if any problems arose before then. he has a f/u with his cards md..Sally Elijah Birk RN  August 18, 2010 2:27 PM

## 2011-01-24 NOTE — Progress Notes (Signed)
  Phone Note Call from Patient   Call For: Dr Nicholes Mango Reason for Call: Acute Illness Summary of Call: Mr Haque called because his BP was running high and his wt is up 10 lbs. He denies edema. Advised him to come to ER for eval, no driving, preferably by calling 911.  Initial call taken by: Park Breed PA-C, November 22, 2010

## 2011-01-24 NOTE — Miscellaneous (Signed)
Summary: Home Health Certification/Care Plan   Home Health Certification/Care Plan   Imported By: Roderic Ovens 09/07/2010 11:38:20  _____________________________________________________________________  External Attachment:    Type:   Image     Comment:   External Document

## 2011-01-25 IMAGING — CT CT ANGIO CHEST
3 of 7 series · 19 of 36 positions shown · IV contrast ([ID] OMNI 300)
Comparison: 06/21/2009

CLINICAL DATA: History of emergent repair of type A aortic
dissection.  Shortness of breath.

CT ANGIOGRAPHY CHEST WITH CONTRAST
TECHNIQUE: Multidetector CT imaging of the chest was performed
using the standard protocol during bolus administration of
intravenous contrast.  Multiplanar CT image reconstructions
including MIPs were obtained to evaluate the vascular anatomy.
Contrast:  100 ml [id number]

[Series 5: angio · axial · 0.66mm/px · z∈[-350,-88]mm · 9 of 133 slices shown]
[im 14/133  lung]
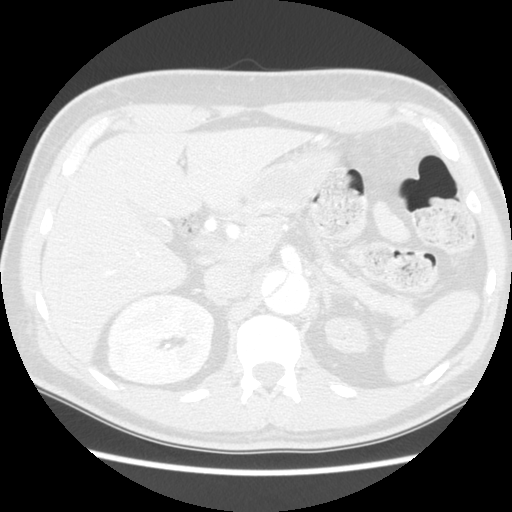
[im 27/133  mediastinal]
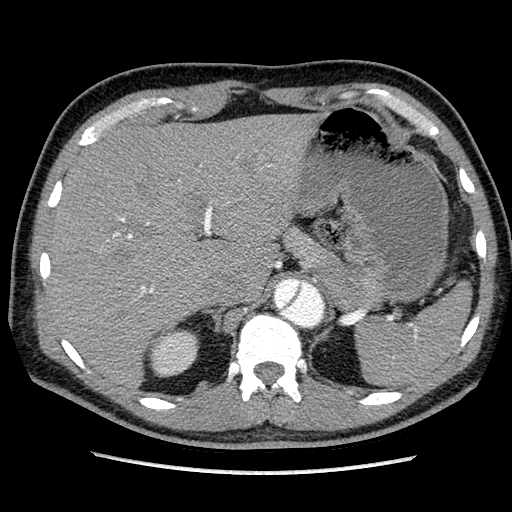
[im 40/133  lung]
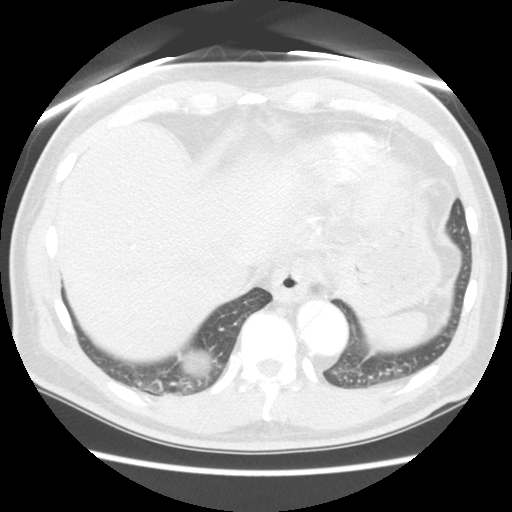
[im 53/133  mediastinal]
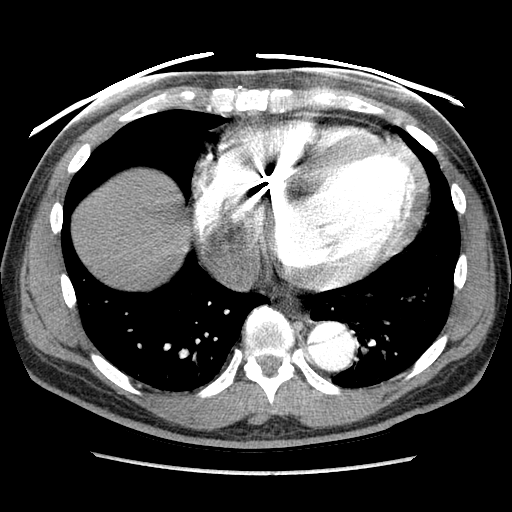
[im 67/133  lung]
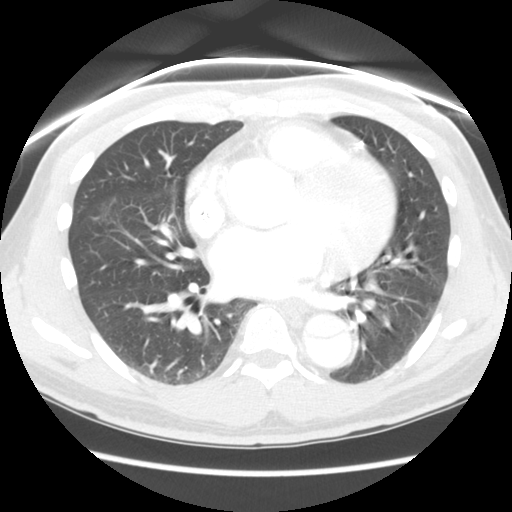
[im 80/133  mediastinal]
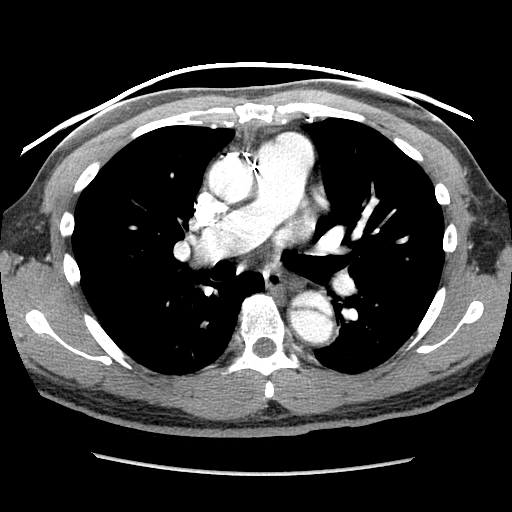
[im 93/133  lung]
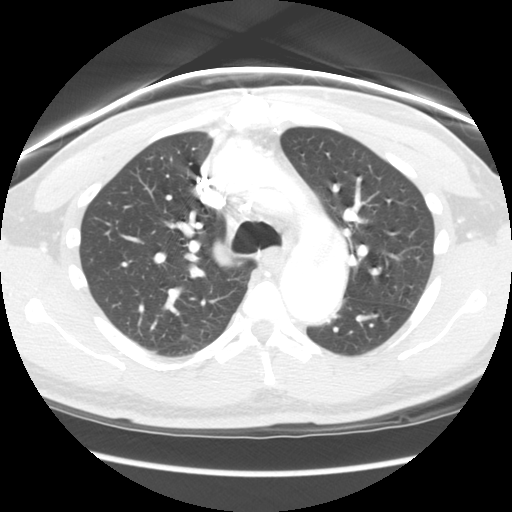
[im 106/133  mediastinal]
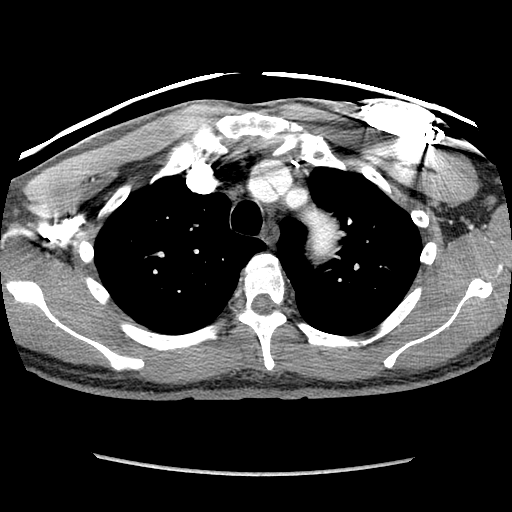
[im 119/133  lung]
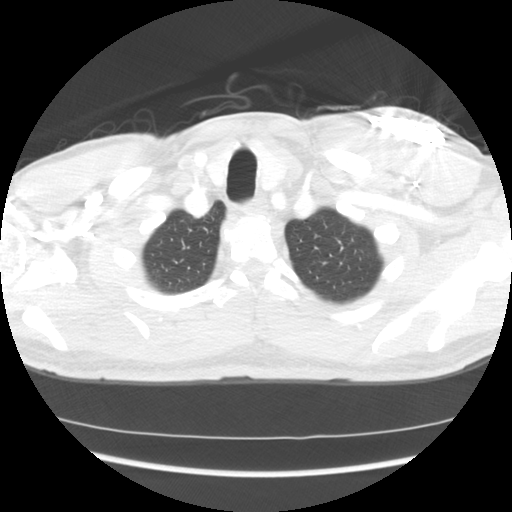

[Series 6: lung windows · axial · 0.62mm/px · z∈[-278,-203]mm · 2 of 61 slices shown]
[im 16/61  mediastinal]
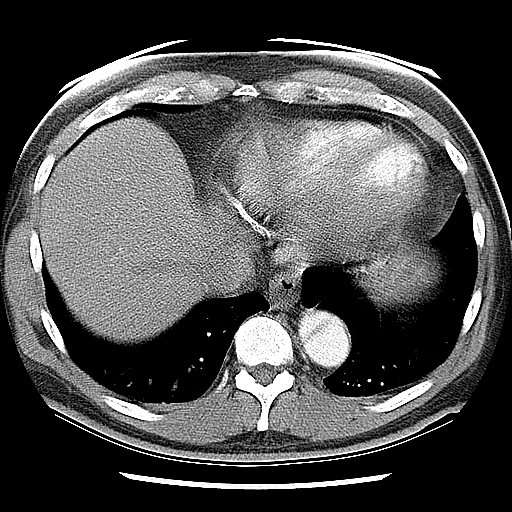
[im 31/61  mediastinal]
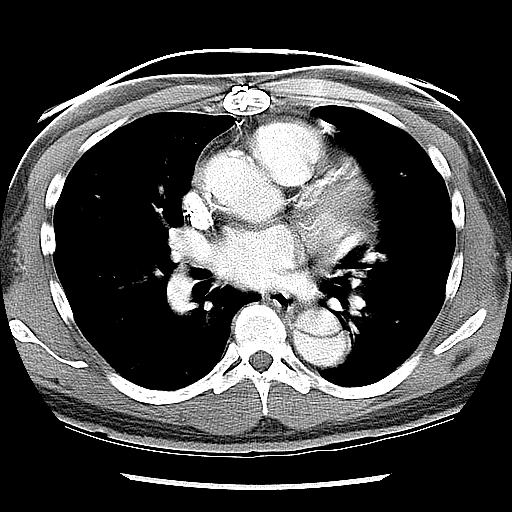

[Series 602: sagittal body · sagittal · 0.66mm/px · 8 of 137 slices shown]
[im 14/137  lung]
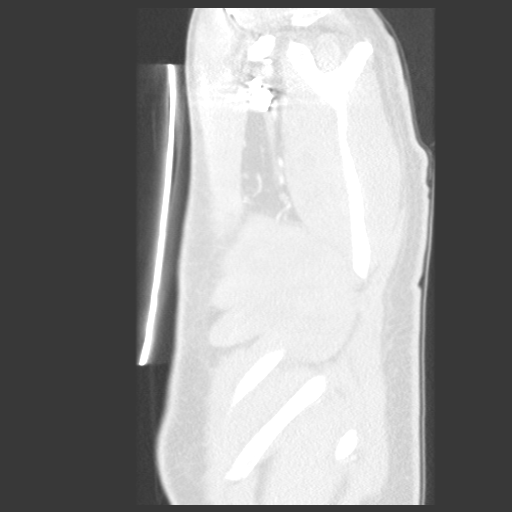
[im 28/137  lung]
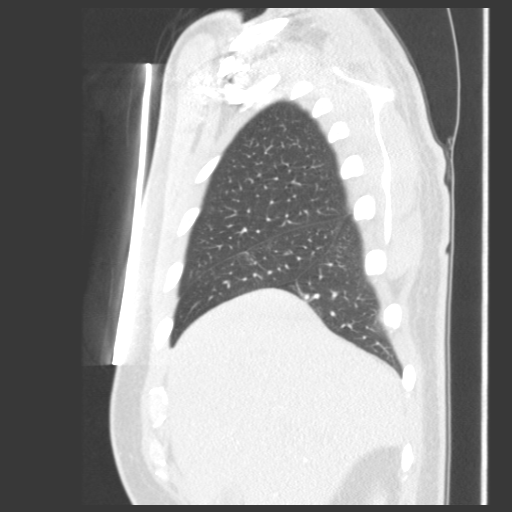
[im 41/137  lung]
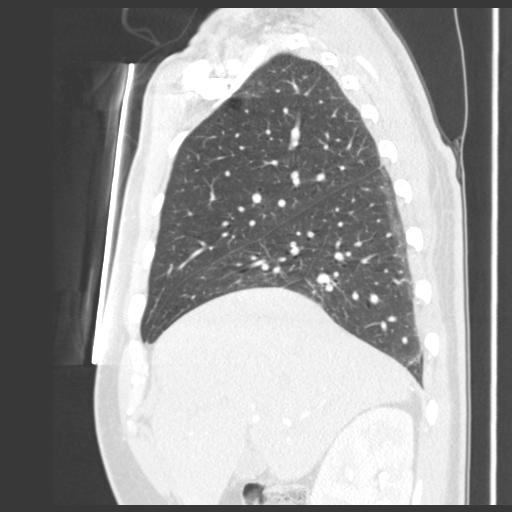
[im 55/137  lung]
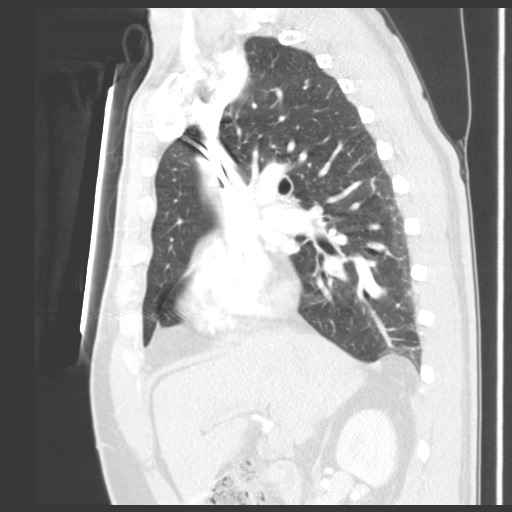
[im 82/137  lung]
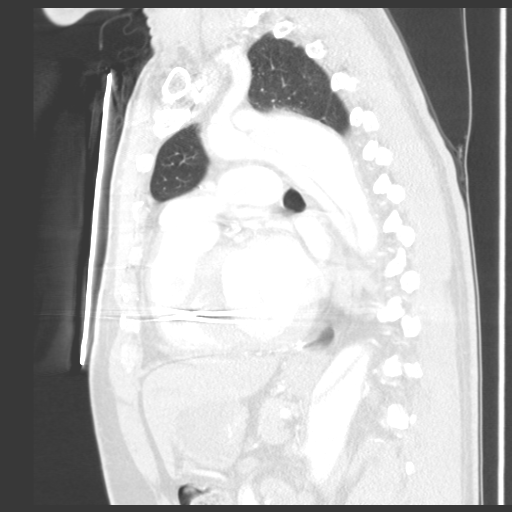
[im 96/137  lung]
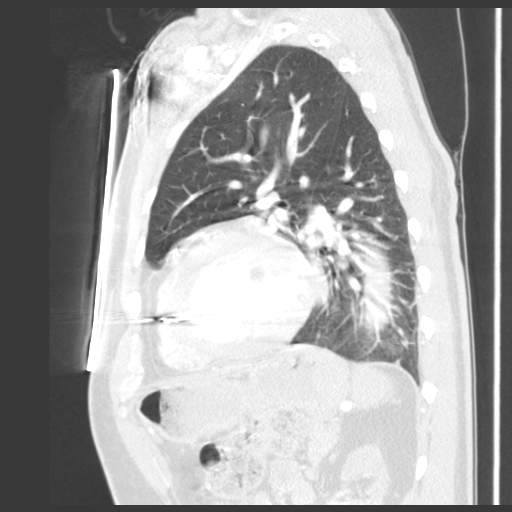
[im 109/137  lung]
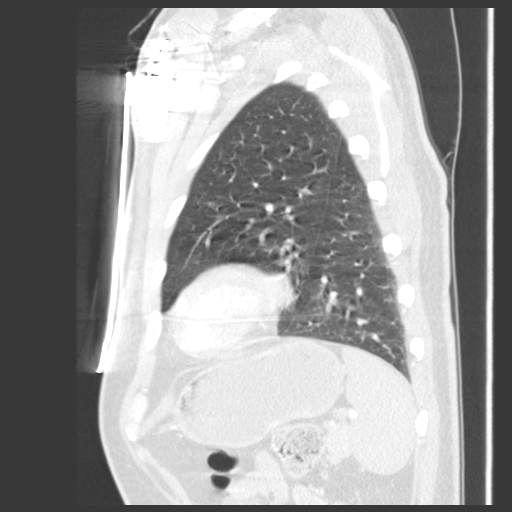
[im 123/137  lung]
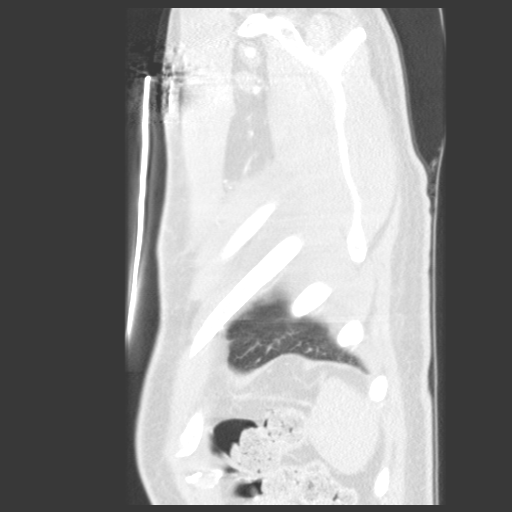

[19 of 36 positions shown; findings below may reference images not displayed]

FINDINGS: There is heterogeneity in the left lobe of the thyroid,
with possible nodule.  No pathologically enlarged mediastinal,
hilar or axillary lymph nodes.

There has been repair of a type A aortic dissection.  The proximal
ascending aorta measures 4.3 cm, stable.  Dissection flap remains
evident in the distal ascending aorta, with extension into the
abdomen.  Distal transverse aorta measures 3.7 cm.  Heart is
enlarged.  Left ventricle is dilated.  No pericardial effusion.
Coronary artery calcification.

Calcified granuloma in the right upper lobe.  Dependent atelectasis
and/or scarring.  No pleural fluid.  Airway is unremarkable.

Incidental imaging of the upper abdomen shows aortic dissection
extending into the origin of the celiac axis, as well as into the
superior mesenteric artery.  Interval marked atrophy of the left
kidney, with asymmetric decreased attenuation.  High density
material layers in the gallbladder.  No worrisome lytic or
sclerotic lesions.

Review of the MIP images confirms the above findings.
IMPRESSION: 1.  Interval repair of type A aortic dissection.  Mild dilatation
of the proximal ascending aorta and distal transverse aorta, as
before.
2.  Evolutionary changes of left renal artery dissection and
infarct involving the left kidney.
3.  Coronary artery calcification.  Cardiac enlargement.
4.  Left thyroid heterogeneity an/or nodule.
5.  Cholelithiasis.

## 2011-01-26 NOTE — Letter (Signed)
Summary: Device-Delinquent Check  [facility], Main Office  [address]   [place], [place] [address]   Phone: [phone]  Fax: [phone]     January 19, 2011 MRN: [id number]   Chad [name] [address] [place], [place]  [address]   Dear [name],  According to our records, you have not had your implanted device checked in the recommended period of time.  We are unable to determine appropriate device function without checking your device on a regular basis.  Please call our office to schedule an appointment, with Dr Ladona Ridgel, as soon as possible.  If you are having your device checked by another physician, please call us so that we may update our records.  Thank you,  [name], EMT  January 19, 2011 1:01 PM  [facility] certified

## 2011-01-27 NOTE — Miscellaneous (Signed)
Summary: [facility] Orders  [facility] Orders   Imported By: Roderic Ovens 04/11/2010 16:14:54  _____________________________________________________________________  External Attachment:    Type:   Image     Comment:   External Document

## 2011-03-02 ENCOUNTER — Encounter (INDEPENDENT_AMBULATORY_CARE_PROVIDER_SITE_OTHER): Payer: Medicaid Other

## 2011-03-02 DIAGNOSIS — I428 Other cardiomyopathies: Secondary | ICD-10-CM

## 2011-03-06 LAB — CBC
HCT: 35.5 % — ABNORMAL LOW (ref 39.0–52.0)
Hemoglobin: 11.6 g/dL — ABNORMAL LOW (ref 13.0–17.0)
Hemoglobin: 13 g/dL (ref 13.0–17.0)
MCH: 27.5 pg (ref 26.0–34.0)
MCHC: 32.7 g/dL (ref 30.0–36.0)
MCHC: 33 g/dL (ref 30.0–36.0)
RBC: 4.31 MIL/uL (ref 4.22–5.81)
RDW: 13.6 % (ref 11.5–15.5)
WBC: 3.3 10*3/uL — ABNORMAL LOW (ref 4.0–10.5)

## 2011-03-06 LAB — BASIC METABOLIC PANEL
BUN: 13 mg/dL (ref 6–23)
BUN: 14 mg/dL (ref 6–23)
BUN: 15 mg/dL (ref 6–23)
CO2: 23 mEq/L (ref 19–32)
CO2: 28 mEq/L (ref 19–32)
Calcium: 9 mg/dL (ref 8.4–10.5)
Chloride: 105 mEq/L (ref 96–112)
Chloride: 107 mEq/L (ref 96–112)
Creatinine, Ser: 1.41 mg/dL (ref 0.4–1.5)
GFR calc non Af Amer: 53 mL/min — ABNORMAL LOW (ref >60)
Glucose, Bld: 109 mg/dL — ABNORMAL HIGH (ref 70–99)
Glucose, Bld: 112 mg/dL — ABNORMAL HIGH (ref 70–99)
Potassium: 3.7 mEq/L (ref 3.5–5.1)
Sodium: 140 mEq/L (ref 135–145)

## 2011-03-06 LAB — CORTISOL-AM, BLOOD: Cortisol - AM: 3.2 ug/dL — ABNORMAL LOW (ref 4.3–22.4)

## 2011-03-06 LAB — LIPID PANEL
Cholesterol: 136 mg/dL (ref 0–200)
HDL: 42 mg/dL (ref >39)
LDL Cholesterol: 76 mg/dL (ref 0–99)
Triglycerides: 90 mg/dL (ref <150)

## 2011-03-06 LAB — MRSA PCR SCREENING: MRSA by PCR: NEGATIVE

## 2011-03-06 LAB — DIGOXIN LEVEL: Digoxin Level: 0.6 ng/mL — ABNORMAL LOW (ref 0.8–2.0)

## 2011-03-06 LAB — CARDIAC PANEL(CRET KIN+CKTOT+MB+TROPI)
CK, MB: 1.6 ng/mL (ref 0.3–4.0)
Total CK: 121 U/L (ref 7–232)
Troponin I: 0.04 ng/mL (ref 0.00–0.06)

## 2011-03-06 LAB — TROPONIN I: Troponin I: 0.02 ng/mL (ref 0.00–0.06)

## 2011-03-06 LAB — HEPARIN LEVEL (UNFRACTIONATED): Heparin Unfractionated: <0.1 IU/mL — ABNORMAL LOW (ref 0.30–0.70)

## 2011-03-07 ENCOUNTER — Encounter: Payer: Self-pay | Admitting: Internal Medicine

## 2011-03-07 LAB — CBC
MCH: 26.8 pg (ref 26.0–34.0)
MCV: 82.5 fL (ref 78.0–100.0)
Platelets: 165 10*3/uL (ref 150–400)
RDW: 13.5 % (ref 11.5–15.5)

## 2011-03-07 LAB — BASIC METABOLIC PANEL
BUN: 17 mg/dL (ref 6–23)
CO2: 28 mEq/L (ref 19–32)
Chloride: 102 mEq/L (ref 96–112)
Creatinine, Ser: 1.48 mg/dL (ref 0.4–1.5)

## 2011-03-07 LAB — DIFFERENTIAL
Basophils Absolute: 0 10*3/uL (ref 0.0–0.1)
Eosinophils Absolute: 0.2 10*3/uL (ref 0.0–0.7)
Eosinophils Relative: 6 % — ABNORMAL HIGH (ref 0–5)
Lymphs Abs: 1.7 10*3/uL (ref 0.7–4.0)

## 2011-03-07 LAB — PROTIME-INR
INR: 0.98 (ref 0.00–1.49)
Prothrombin Time: 13.2 seconds (ref 11.6–15.2)

## 2011-03-07 LAB — POCT I-STAT, CHEM 8
Creatinine, Ser: 1.7 mg/dL — ABNORMAL HIGH (ref 0.4–1.5)
Hemoglobin: 13.3 g/dL (ref 13.0–17.0)
Sodium: 140 mEq/L (ref 135–145)
TCO2: 27 mmol/L (ref 0–100)

## 2011-03-07 LAB — POCT CARDIAC MARKERS
Myoglobin, poc: 50.6 ng/mL (ref 12–200)
Myoglobin, poc: 61.7 ng/mL (ref 12–200)

## 2011-03-09 LAB — PROTIME-INR
INR: 1.07 (ref 0.00–1.49)
Prothrombin Time: 14.1 s (ref 11.6–15.2)

## 2011-03-09 LAB — BASIC METABOLIC PANEL
BUN: 14 mg/dL (ref 6–23)
BUN: 14 mg/dL (ref 6–23)
CO2: 26 mEq/L (ref 19–32)
CO2: 27 mEq/L (ref 19–32)
Calcium: 8.7 mg/dL (ref 8.4–10.5)
Calcium: 9 mg/dL (ref 8.4–10.5)
Chloride: 105 mEq/L (ref 96–112)
Chloride: 107 mEq/L (ref 96–112)
Creatinine, Ser: 1.32 mg/dL (ref 0.4–1.5)
Creatinine, Ser: 1.4 mg/dL (ref 0.4–1.5)
GFR calc Af Amer: >60 mL/min (ref >60)
GFR calc Af Amer: >60 mL/min (ref >60)
GFR calc non Af Amer: 54 mL/min — ABNORMAL LOW (ref >60)
Glucose, Bld: 107 mg/dL — ABNORMAL HIGH (ref 70–99)
Glucose, Bld: 98 mg/dL (ref 70–99)
Potassium: 4.6 mEq/L (ref 3.5–5.1)
Sodium: 139 mEq/L (ref 135–145)
Sodium: 140 mEq/L (ref 135–145)

## 2011-03-09 LAB — CK TOTAL AND CKMB (NOT AT ARMC)
CK, MB: 1.6 ng/mL (ref 0.3–4.0)
Relative Index: 1 (ref 0.0–2.5)
Relative Index: 2.7 — ABNORMAL HIGH (ref 0.0–2.5)
Total CK: 165 U/L (ref 7–232)

## 2011-03-09 LAB — LIPID PANEL
Cholesterol: 175 mg/dL (ref 0–200)
HDL: 60 mg/dL (ref >39)
LDL Cholesterol: 103 mg/dL — ABNORMAL HIGH (ref 0–99)
Total CHOL/HDL Ratio: 2.9 RATIO
Triglycerides: 59 mg/dL (ref <150)

## 2011-03-09 LAB — CBC
HCT: 32.8 % — ABNORMAL LOW (ref 39.0–52.0)
HCT: 36.8 % — ABNORMAL LOW (ref 39.0–52.0)
HCT: 38.9 % — ABNORMAL LOW (ref 39.0–52.0)
Hemoglobin: 10.9 g/dL — ABNORMAL LOW (ref 13.0–17.0)
Hemoglobin: 12.3 g/dL — ABNORMAL LOW (ref 13.0–17.0)
Hemoglobin: 12.6 g/dL — ABNORMAL LOW (ref 13.0–17.0)
Hemoglobin: 12.7 g/dL — ABNORMAL LOW (ref 13.0–17.0)
Hemoglobin: 12.8 g/dL — ABNORMAL LOW (ref 13.0–17.0)
MCH: 27 pg (ref 26.0–34.0)
MCH: 27 pg (ref 26.0–34.0)
MCH: 27.3 pg (ref 26.0–34.0)
MCH: 27.3 pg (ref 26.0–34.0)
MCHC: 33 g/dL (ref 30.0–36.0)
MCHC: 33.2 g/dL (ref 30.0–36.0)
MCHC: 33.4 g/dL (ref 30.0–36.0)
MCV: 81.2 fL (ref 78.0–100.0)
MCV: 81.8 fL (ref 78.0–100.0)
MCV: 82.2 fL (ref 78.0–100.0)
Platelets: 140 10*3/uL — ABNORMAL LOW (ref 150–400)
RBC: 4.04 MIL/uL — ABNORMAL LOW (ref 4.22–5.81)
RBC: 4.5 MIL/uL (ref 4.22–5.81)
RBC: 4.66 MIL/uL (ref 4.22–5.81)
RBC: 4.67 MIL/uL (ref 4.22–5.81)
RBC: 4.71 MIL/uL (ref 4.22–5.81)
RDW: 13.9 % (ref 11.5–15.5)
WBC: 3.1 K/uL — ABNORMAL LOW (ref 4.0–10.5)
WBC: 5.5 10*3/uL (ref 4.0–10.5)
WBC: 6 10*3/uL (ref 4.0–10.5)

## 2011-03-09 LAB — BASIC METABOLIC PANEL WITH GFR
CO2: 28 meq/L (ref 19–32)
Chloride: 106 meq/L (ref 96–112)
GFR calc Af Amer: >60 mL/min (ref >60)
Potassium: 3.2 meq/L — ABNORMAL LOW (ref 3.5–5.1)

## 2011-03-09 LAB — HEPARIN LEVEL (UNFRACTIONATED): Heparin Unfractionated: 0.28 IU/mL — ABNORMAL LOW (ref 0.30–0.70)

## 2011-03-09 LAB — CARDIAC PANEL(CRET KIN+CKTOT+MB+TROPI)
Relative Index: 11.9 — ABNORMAL HIGH (ref 0.0–2.5)
Relative Index: 9 — ABNORMAL HIGH (ref 0.0–2.5)
Total CK: 655 U/L — ABNORMAL HIGH (ref 7–232)
Total CK: 772 U/L — ABNORMAL HIGH (ref 7–232)
Troponin I: 15.2 ng/mL (ref 0.00–0.06)

## 2011-03-09 LAB — POCT CARDIAC MARKERS
CKMB, poc: <1 ng/mL — ABNORMAL LOW (ref 1.0–8.0)
Myoglobin, poc: 45.2 ng/mL (ref 12–200)
Troponin i, poc: <0.05 ng/mL (ref 0.00–0.09)

## 2011-03-09 LAB — DIFFERENTIAL
Basophils Absolute: 0 10*3/uL (ref 0.0–0.1)
Basophils Relative: 0 % (ref 0–1)
Eosinophils Absolute: 0.4 10*3/uL (ref 0.0–0.7)
Eosinophils Relative: 12 % — ABNORMAL HIGH (ref 0–5)
Lymphocytes Relative: 53 % — ABNORMAL HIGH (ref 12–46)
Lymphs Abs: 1.6 10*3/uL (ref 0.7–4.0)
Monocytes Absolute: 0.4 10*3/uL (ref 0.1–1.0)
Monocytes Relative: 13 % — ABNORMAL HIGH (ref 3–12)
Neutro Abs: 0.7 10*3/uL — ABNORMAL LOW (ref 1.7–7.7)
Neutrophils Relative %: 22 % — ABNORMAL LOW (ref 43–77)

## 2011-03-09 LAB — APTT: aPTT: 32 seconds (ref 24–37)

## 2011-03-09 LAB — DIGOXIN LEVEL: Digoxin Level: 0.7 ng/mL — ABNORMAL LOW (ref 0.8–2.0)

## 2011-03-09 LAB — TROPONIN I: Troponin I: 0.02 ng/mL (ref 0.00–0.06)

## 2011-03-10 LAB — POCT I-STAT, CHEM 8
Calcium, Ion: 1.14 mmol/L (ref 1.12–1.32)
Hemoglobin: 12.9 g/dL — ABNORMAL LOW (ref 13.0–17.0)
Sodium: 140 mEq/L (ref 135–145)
TCO2: 26 mmol/L (ref 0–100)

## 2011-03-10 LAB — CBC
HCT: 35.7 % — ABNORMAL LOW (ref 39.0–52.0)
MCH: 27.3 pg (ref 26.0–34.0)
MCHC: 33.1 g/dL (ref 30.0–36.0)
MCV: 82.6 fL (ref 78.0–100.0)
Platelets: 133 10*3/uL — ABNORMAL LOW (ref 150–400)
RDW: 13.7 % (ref 11.5–15.5)
WBC: 3.2 10*3/uL — ABNORMAL LOW (ref 4.0–10.5)

## 2011-03-10 LAB — POCT CARDIAC MARKERS: Myoglobin, poc: 62.2 ng/mL (ref 12–200)

## 2011-03-10 LAB — DIFFERENTIAL
Eosinophils Absolute: 0.2 10*3/uL (ref 0.0–0.7)
Eosinophils Relative: 5 % (ref 0–5)
Lymphocytes Relative: 49 % — ABNORMAL HIGH (ref 12–46)
Lymphs Abs: 1.6 10*3/uL (ref 0.7–4.0)
Monocytes Absolute: 0.3 10*3/uL (ref 0.1–1.0)
Monocytes Relative: 8 % (ref 3–12)

## 2011-03-10 LAB — CK TOTAL AND CKMB (NOT AT ARMC): CK, MB: 2.5 ng/mL (ref 0.3–4.0)

## 2011-03-10 LAB — DIGOXIN LEVEL: Digoxin Level: <0.2 ng/mL — ABNORMAL LOW (ref 0.8–2.0)

## 2011-03-10 LAB — TROPONIN I
Troponin I: 0.09 ng/mL — ABNORMAL HIGH (ref 0.00–0.06)
Troponin I: 0.13 ng/mL — ABNORMAL HIGH (ref 0.00–0.06)

## 2011-03-12 LAB — POCT CARDIAC MARKERS
CKMB, poc: 2.3 ng/mL (ref 1.0–8.0)
CKMB, poc: <1 ng/mL — ABNORMAL LOW (ref 1.0–8.0)
Myoglobin, poc: 61.3 ng/mL (ref 12–200)
Troponin i, poc: <0.05 ng/mL (ref 0.00–0.09)

## 2011-03-12 LAB — COMPREHENSIVE METABOLIC PANEL
AST: 23 U/L (ref 0–37)
BUN: 16 mg/dL (ref 6–23)
CO2: 27 mEq/L (ref 19–32)
Calcium: 9.2 mg/dL (ref 8.4–10.5)
Chloride: 103 mEq/L (ref 96–112)
Creatinine, Ser: 1.5 mg/dL (ref 0.4–1.5)
GFR calc non Af Amer: 50 mL/min — ABNORMAL LOW (ref >60)
Glucose, Bld: 132 mg/dL — ABNORMAL HIGH (ref 70–99)
Total Bilirubin: 0.7 mg/dL (ref 0.3–1.2)

## 2011-03-12 LAB — BASIC METABOLIC PANEL
CO2: 33 mEq/L — ABNORMAL HIGH (ref 19–32)
Calcium: 9.8 mg/dL (ref 8.4–10.5)
Creatinine, Ser: 1.59 mg/dL — ABNORMAL HIGH (ref 0.4–1.5)
GFR calc Af Amer: 57 mL/min — ABNORMAL LOW (ref >60)
Sodium: 148 mEq/L — ABNORMAL HIGH (ref 135–145)

## 2011-03-12 LAB — GLUCOSE, CAPILLARY: Glucose-Capillary: 128 mg/dL — ABNORMAL HIGH (ref 70–99)

## 2011-03-12 LAB — CBC
Hemoglobin: 12.7 g/dL — ABNORMAL LOW (ref 13.0–17.0)
MCH: 28.5 pg (ref 26.0–34.0)
Platelets: 145 10*3/uL — ABNORMAL LOW (ref 150–400)
RBC: 4.45 MIL/uL (ref 4.22–5.81)
WBC: 3.8 10*3/uL — ABNORMAL LOW (ref 4.0–10.5)

## 2011-03-12 LAB — DIFFERENTIAL
Eosinophils Absolute: 0.3 10*3/uL (ref 0.0–0.7)
Lymphocytes Relative: 43 % (ref 12–46)
Lymphs Abs: 1.6 10*3/uL (ref 0.7–4.0)
Monocytes Relative: 9 % (ref 3–12)
Neutro Abs: 1.5 10*3/uL — ABNORMAL LOW (ref 1.7–7.7)
Neutrophils Relative %: 40 % — ABNORMAL LOW (ref 43–77)

## 2011-03-14 LAB — URINALYSIS, ROUTINE W REFLEX MICROSCOPIC
Bilirubin Urine: NEGATIVE
Hgb urine dipstick: NEGATIVE
Ketones, ur: NEGATIVE mg/dL
Nitrite: NEGATIVE
Specific Gravity, Urine: 1.019 (ref 1.005–1.030)
Urobilinogen, UA: 0.2 mg/dL (ref 0.0–1.0)

## 2011-03-14 LAB — DIFFERENTIAL
Basophils Absolute: 0 10*3/uL (ref 0.0–0.1)
Basophils Relative: 0 % (ref 0–1)
Eosinophils Absolute: 0.2 10*3/uL (ref 0.0–0.7)
Eosinophils Relative: 4 % (ref 0–5)
Lymphocytes Relative: 29 % (ref 12–46)
Lymphs Abs: 1.2 10*3/uL (ref 0.7–4.0)
Monocytes Relative: 7 % (ref 3–12)
Neutro Abs: 2.5 10*3/uL (ref 1.7–7.7)
Neutrophils Relative %: 60 % (ref 43–77)

## 2011-03-14 LAB — BASIC METABOLIC PANEL
BUN: 16 mg/dL (ref 6–23)
Creatinine, Ser: 1.37 mg/dL (ref 0.4–1.5)
GFR calc Af Amer: >60 mL/min (ref >60)
GFR calc non Af Amer: 56 mL/min — ABNORMAL LOW (ref >60)
Potassium: 3.5 mEq/L (ref 3.5–5.1)

## 2011-03-14 LAB — RAPID URINE DRUG SCREEN, HOSP PERFORMED
Amphetamines: NOT DETECTED
Cocaine: NOT DETECTED
Opiates: POSITIVE — AB
Tetrahydrocannabinol: NOT DETECTED

## 2011-03-14 LAB — CBC
Platelets: 157 10*3/uL (ref 150–400)
RBC: 4.64 MIL/uL (ref 4.22–5.81)
WBC: 4.3 10*3/uL (ref 4.0–10.5)

## 2011-03-14 LAB — GLUCOSE, CAPILLARY

## 2011-03-14 NOTE — Cardiovascular Report (Signed)
Summary: Certified Letter Signed - Patient (not doing f/u)  Certified Letter Signed - Patient (not doing f/u)   Imported By: Debby Freiberg 03/07/2011 16:27:06  _____________________________________________________________________  External Attachment:    Type:   Image     Comment:   External Document

## 2011-03-29 LAB — BASIC METABOLIC PANEL
BUN: 14 mg/dL (ref 6–23)
CO2: 28 mEq/L (ref 19–32)
Chloride: 104 mEq/L (ref 96–112)
Chloride: 106 mEq/L (ref 96–112)
Creatinine, Ser: 1.53 mg/dL — ABNORMAL HIGH (ref 0.4–1.5)
GFR calc Af Amer: >60 mL/min (ref >60)
GFR calc non Af Amer: 57 mL/min — ABNORMAL LOW (ref >60)
Glucose, Bld: 89 mg/dL (ref 70–99)
Potassium: 3.1 mEq/L — ABNORMAL LOW (ref 3.5–5.1)

## 2011-03-29 LAB — COMPREHENSIVE METABOLIC PANEL
ALT: 11 U/L (ref 0–53)
AST: 15 U/L (ref 0–37)
CO2: 31 mEq/L (ref 19–32)
Chloride: 104 mEq/L (ref 96–112)
Creatinine, Ser: 1.4 mg/dL (ref 0.4–1.5)
GFR calc Af Amer: >60 mL/min (ref >60)
GFR calc non Af Amer: 55 mL/min — ABNORMAL LOW (ref >60)
Total Bilirubin: 0.6 mg/dL (ref 0.3–1.2)

## 2011-03-29 LAB — CBC
HCT: 37.4 % — ABNORMAL LOW (ref 39.0–52.0)
MCV: 75.9 fL — ABNORMAL LOW (ref 78.0–100.0)
RBC: 4.93 MIL/uL (ref 4.22–5.81)
WBC: 3.6 10*3/uL — ABNORMAL LOW (ref 4.0–10.5)

## 2011-03-29 LAB — DIFFERENTIAL
Basophils Relative: 0 % (ref 0–1)
Eosinophils Absolute: 0.2 10*3/uL (ref 0.0–0.7)
Lymphs Abs: 1.9 10*3/uL (ref 0.7–4.0)
Monocytes Relative: 11 % (ref 3–12)
Neutro Abs: 1.1 10*3/uL — ABNORMAL LOW (ref 1.7–7.7)
Neutrophils Relative %: 30 % — ABNORMAL LOW (ref 43–77)

## 2011-03-29 LAB — CK TOTAL AND CKMB (NOT AT ARMC)
Relative Index: 0.9 (ref 0.0–2.5)
Total CK: 145 U/L (ref 7–232)

## 2011-03-29 LAB — CARDIAC PANEL(CRET KIN+CKTOT+MB+TROPI)
Total CK: 131 U/L (ref 7–232)
Troponin I: 0.02 ng/mL (ref 0.00–0.06)
Troponin I: 0.02 ng/mL (ref 0.00–0.06)

## 2011-03-29 LAB — TROPONIN I: Troponin I: 0.02 ng/mL (ref 0.00–0.06)

## 2011-03-31 LAB — CARDIAC PANEL(CRET KIN+CKTOT+MB+TROPI)
CK, MB: 1.1 ng/mL (ref 0.3–4.0)
Total CK: 113 U/L (ref 7–232)
Total CK: 99 U/L (ref 7–232)
Troponin I: 0.14 ng/mL — ABNORMAL HIGH (ref 0.00–0.06)
Troponin I: 0.16 ng/mL — ABNORMAL HIGH (ref 0.00–0.06)

## 2011-03-31 LAB — DIFFERENTIAL
Basophils Absolute: 0 10*3/uL (ref 0.0–0.1)
Basophils Relative: 1 % (ref 0–1)
Eosinophils Relative: 5 % (ref 0–5)
Lymphocytes Relative: 38 % (ref 12–46)
Neutro Abs: 1.6 10*3/uL — ABNORMAL LOW (ref 1.7–7.7)

## 2011-03-31 LAB — CBC
HCT: 29.5 % — ABNORMAL LOW (ref 39.0–52.0)
HCT: 32.2 % — ABNORMAL LOW (ref 39.0–52.0)
Hemoglobin: 9.5 g/dL — ABNORMAL LOW (ref 13.0–17.0)
MCV: 76.2 fL — ABNORMAL LOW (ref 78.0–100.0)
MCV: 76.3 fL — ABNORMAL LOW (ref 78.0–100.0)
Platelets: 229 10*3/uL (ref 150–400)
RBC: 3.87 MIL/uL — ABNORMAL LOW (ref 4.22–5.81)
RDW: 18.8 % — ABNORMAL HIGH (ref 11.5–15.5)
WBC: 3.6 10*3/uL — ABNORMAL LOW (ref 4.0–10.5)

## 2011-03-31 LAB — APTT: aPTT: 31 seconds (ref 24–37)

## 2011-03-31 LAB — POCT CARDIAC MARKERS
Myoglobin, poc: 63.6 ng/mL (ref 12–200)
Troponin i, poc: <0.05 ng/mL (ref 0.00–0.09)

## 2011-03-31 LAB — COMPREHENSIVE METABOLIC PANEL
AST: 13 U/L (ref 0–37)
Alkaline Phosphatase: 60 U/L (ref 39–117)
BUN: 12 mg/dL (ref 6–23)
CO2: 28 mEq/L (ref 19–32)
Chloride: 106 mEq/L (ref 96–112)
Creatinine, Ser: 1.34 mg/dL (ref 0.4–1.5)
GFR calc non Af Amer: 57 mL/min — ABNORMAL LOW (ref >60)
Total Bilirubin: 0.6 mg/dL (ref 0.3–1.2)

## 2011-03-31 LAB — DIGOXIN LEVEL: Digoxin Level: 1 ng/mL (ref 0.8–2.0)

## 2011-04-01 LAB — CARDIAC PANEL(CRET KIN+CKTOT+MB+TROPI)
CK, MB: 0.3 ng/mL (ref 0.3–4.0)
CK, MB: 0.4 ng/mL (ref 0.3–4.0)
Relative Index: INVALID (ref 0.0–2.5)
Relative Index: INVALID (ref 0.0–2.5)
Total CK: 57 U/L (ref 7–232)
Total CK: 69 U/L (ref 7–232)
Troponin I: 0.04 ng/mL (ref 0.00–0.06)

## 2011-04-02 LAB — GLUCOSE, CAPILLARY: Glucose-Capillary: 117 mg/dL — ABNORMAL HIGH (ref 70–99)

## 2011-04-02 LAB — COMPREHENSIVE METABOLIC PANEL
ALT: 52 U/L (ref 0–53)
ALT: 60 U/L — ABNORMAL HIGH (ref 0–53)
AST: 51 U/L — ABNORMAL HIGH (ref 0–37)
AST: 57 U/L — ABNORMAL HIGH (ref 0–37)
Albumin: 2.8 g/dL — ABNORMAL LOW (ref 3.5–5.2)
Albumin: 2.9 g/dL — ABNORMAL LOW (ref 3.5–5.2)
Alkaline Phosphatase: 35 U/L — ABNORMAL LOW (ref 39–117)
Alkaline Phosphatase: 45 U/L (ref 39–117)
Alkaline Phosphatase: 79 U/L (ref 39–117)
BUN: 14 mg/dL (ref 6–23)
BUN: 20 mg/dL (ref 6–23)
BUN: 25 mg/dL — ABNORMAL HIGH (ref 6–23)
CO2: 29 mEq/L (ref 19–32)
CO2: 32 mEq/L (ref 19–32)
Calcium: 7.4 mg/dL — ABNORMAL LOW (ref 8.4–10.5)
Calcium: 7.6 mg/dL — ABNORMAL LOW (ref 8.4–10.5)
Chloride: 102 mEq/L (ref 96–112)
Chloride: 105 mEq/L (ref 96–112)
Creatinine, Ser: 2.08 mg/dL — ABNORMAL HIGH (ref 0.4–1.5)
Creatinine, Ser: 2.27 mg/dL — ABNORMAL HIGH (ref 0.4–1.5)
GFR calc Af Amer: 38 mL/min — ABNORMAL LOW (ref >60)
GFR calc Af Amer: 42 mL/min — ABNORMAL LOW (ref >60)
GFR calc non Af Amer: 31 mL/min — ABNORMAL LOW (ref >60)
GFR calc non Af Amer: 35 mL/min — ABNORMAL LOW (ref >60)
GFR calc non Af Amer: 41 mL/min — ABNORMAL LOW (ref >60)
Glucose, Bld: 113 mg/dL — ABNORMAL HIGH (ref 70–99)
Glucose, Bld: 115 mg/dL — ABNORMAL HIGH (ref 70–99)
Glucose, Bld: 118 mg/dL — ABNORMAL HIGH (ref 70–99)
Potassium: 3.7 mEq/L (ref 3.5–5.1)
Potassium: 3.8 mEq/L (ref 3.5–5.1)
Potassium: 4.6 mEq/L (ref 3.5–5.1)
Sodium: 139 mEq/L (ref 135–145)
Sodium: 144 mEq/L (ref 135–145)
Total Bilirubin: 0.8 mg/dL (ref 0.3–1.2)
Total Bilirubin: 0.9 mg/dL (ref 0.3–1.2)
Total Bilirubin: 0.9 mg/dL (ref 0.3–1.2)
Total Protein: 5.2 g/dL — ABNORMAL LOW (ref 6.0–8.3)
Total Protein: 5.4 g/dL — ABNORMAL LOW (ref 6.0–8.3)
Total Protein: 6.9 g/dL (ref 6.0–8.3)

## 2011-04-02 LAB — CBC
HCT: 23.1 % — ABNORMAL LOW (ref 39.0–52.0)
HCT: 24.5 % — ABNORMAL LOW (ref 39.0–52.0)
HCT: 19.5 % — ABNORMAL LOW (ref 39.0–52.0)
HCT: 20 % — ABNORMAL LOW (ref 39.0–52.0)
HCT: 24.1 % — ABNORMAL LOW (ref 39.0–52.0)
HCT: 24.7 % — ABNORMAL LOW (ref 39.0–52.0)
HCT: 25.3 % — ABNORMAL LOW (ref 39.0–52.0)
HCT: 25.4 % — ABNORMAL LOW (ref 39.0–52.0)
HCT: 25.7 % — ABNORMAL LOW (ref 39.0–52.0)
HCT: 26.2 % — ABNORMAL LOW (ref 39.0–52.0)
HCT: 26.3 % — ABNORMAL LOW (ref 39.0–52.0)
HCT: 28.6 % — ABNORMAL LOW (ref 39.0–52.0)
HCT: 30 % — ABNORMAL LOW (ref 39.0–52.0)
Hemoglobin: 7.6 g/dL — CL (ref 13.0–17.0)
Hemoglobin: 8.1 g/dL — ABNORMAL LOW (ref 13.0–17.0)
Hemoglobin: 8.3 g/dL — ABNORMAL LOW (ref 13.0–17.0)
Hemoglobin: 6.7 g/dL — CL (ref 13.0–17.0)
Hemoglobin: 6.7 g/dL — CL (ref 13.0–17.0)
Hemoglobin: 8.3 g/dL — ABNORMAL LOW (ref 13.0–17.0)
Hemoglobin: 8.5 g/dL — ABNORMAL LOW (ref 13.0–17.0)
Hemoglobin: 8.6 g/dL — ABNORMAL LOW (ref 13.0–17.0)
Hemoglobin: 8.7 g/dL — ABNORMAL LOW (ref 13.0–17.0)
Hemoglobin: 8.8 g/dL — ABNORMAL LOW (ref 13.0–17.0)
Hemoglobin: 9.9 g/dL — ABNORMAL LOW (ref 13.0–17.0)
MCHC: 32.8 g/dL (ref 30.0–36.0)
MCHC: 32.8 g/dL (ref 30.0–36.0)
MCHC: 33 g/dL (ref 30.0–36.0)
MCHC: 33.1 g/dL (ref 30.0–36.0)
MCHC: 33.3 g/dL (ref 30.0–36.0)
MCHC: 33.8 g/dL (ref 30.0–36.0)
MCHC: 33.9 g/dL (ref 30.0–36.0)
MCHC: 34.1 g/dL (ref 30.0–36.0)
MCHC: 34.2 g/dL (ref 30.0–36.0)
MCHC: 34.2 g/dL (ref 30.0–36.0)
MCHC: 34.3 g/dL (ref 30.0–36.0)
MCHC: 34.4 g/dL (ref 30.0–36.0)
MCV: 80.4 fL (ref 78.0–100.0)
MCV: 80.5 fL (ref 78.0–100.0)
MCV: 84.1 fL (ref 78.0–100.0)
MCV: 84.7 fL (ref 78.0–100.0)
MCV: 85.1 fL (ref 78.0–100.0)
MCV: 85.1 fL (ref 78.0–100.0)
MCV: 85.7 fL (ref 78.0–100.0)
MCV: 85.8 fL (ref 78.0–100.0)
MCV: 85.9 fL (ref 78.0–100.0)
MCV: 86 fL (ref 78.0–100.0)
MCV: 86.7 fL (ref 78.0–100.0)
Platelets: 236 10*3/uL (ref 150–400)
Platelets: 167 10*3/uL (ref 150–400)
Platelets: 207 10*3/uL (ref 150–400)
Platelets: 421 10*3/uL — ABNORMAL HIGH (ref 150–400)
Platelets: 427 10*3/uL — ABNORMAL HIGH (ref 150–400)
Platelets: 444 10*3/uL — ABNORMAL HIGH (ref 150–400)
Platelets: 466 10*3/uL — ABNORMAL HIGH (ref 150–400)
Platelets: 67 10*3/uL — ABNORMAL LOW (ref 150–400)
Platelets: 67 10*3/uL — ABNORMAL LOW (ref 150–400)
Platelets: 68 10*3/uL — ABNORMAL LOW (ref 150–400)
Platelets: 82 10*3/uL — ABNORMAL LOW (ref 150–400)
Platelets: 92 10*3/uL — ABNORMAL LOW (ref 150–400)
RBC: 2.86 MIL/uL — ABNORMAL LOW (ref 4.22–5.81)
RBC: 3.09 MIL/uL — ABNORMAL LOW (ref 4.22–5.81)
RBC: 3.29 MIL/uL — ABNORMAL LOW (ref 4.22–5.81)
RBC: 3.42 MIL/uL — ABNORMAL LOW (ref 4.22–5.81)
RBC: 2.27 MIL/uL — ABNORMAL LOW (ref 4.22–5.81)
RBC: 2.33 MIL/uL — ABNORMAL LOW (ref 4.22–5.81)
RBC: 2.8 MIL/uL — ABNORMAL LOW (ref 4.22–5.81)
RBC: 2.91 MIL/uL — ABNORMAL LOW (ref 4.22–5.81)
RBC: 2.92 MIL/uL — ABNORMAL LOW (ref 4.22–5.81)
RBC: 2.96 MIL/uL — ABNORMAL LOW (ref 4.22–5.81)
RBC: 3.09 MIL/uL — ABNORMAL LOW (ref 4.22–5.81)
RBC: 3.16 MIL/uL — ABNORMAL LOW (ref 4.22–5.81)
RDW: 16.3 % — ABNORMAL HIGH (ref 11.5–15.5)
RDW: 17 % — ABNORMAL HIGH (ref 11.5–15.5)
RDW: 17.3 % — ABNORMAL HIGH (ref 11.5–15.5)
RDW: 14.2 % (ref 11.5–15.5)
RDW: 14.4 % (ref 11.5–15.5)
RDW: 14.5 % (ref 11.5–15.5)
RDW: 14.5 % (ref 11.5–15.5)
RDW: 14.6 % (ref 11.5–15.5)
RDW: 14.6 % (ref 11.5–15.5)
RDW: 14.6 % (ref 11.5–15.5)
RDW: 14.6 % (ref 11.5–15.5)
RDW: 14.8 % (ref 11.5–15.5)
RDW: 14.9 % (ref 11.5–15.5)
WBC: 10.2 10*3/uL (ref 4.0–10.5)
WBC: 10.3 10*3/uL (ref 4.0–10.5)
WBC: 10.3 10*3/uL (ref 4.0–10.5)
WBC: 10.6 10*3/uL — ABNORMAL HIGH (ref 4.0–10.5)
WBC: 10.6 10*3/uL — ABNORMAL HIGH (ref 4.0–10.5)
WBC: 10.7 10*3/uL — ABNORMAL HIGH (ref 4.0–10.5)
WBC: 10.7 10*3/uL — ABNORMAL HIGH (ref 4.0–10.5)
WBC: 6.8 10*3/uL (ref 4.0–10.5)
WBC: 8.6 10*3/uL (ref 4.0–10.5)
WBC: 8.8 10*3/uL (ref 4.0–10.5)
WBC: 9.5 10*3/uL (ref 4.0–10.5)

## 2011-04-02 LAB — BASIC METABOLIC PANEL
BUN: 11 mg/dL (ref 6–23)
BUN: 14 mg/dL (ref 6–23)
BUN: 15 mg/dL (ref 6–23)
BUN: 15 mg/dL (ref 6–23)
BUN: 16 mg/dL (ref 6–23)
BUN: 17 mg/dL (ref 6–23)
BUN: 18 mg/dL (ref 6–23)
BUN: 26 mg/dL — ABNORMAL HIGH (ref 6–23)
CO2: 26 mEq/L (ref 19–32)
CO2: 28 mEq/L (ref 19–32)
CO2: 32 mEq/L (ref 19–32)
CO2: 24 mEq/L (ref 19–32)
CO2: 28 mEq/L (ref 19–32)
CO2: 30 mEq/L (ref 19–32)
CO2: 30 mEq/L (ref 19–32)
CO2: 31 mEq/L (ref 19–32)
CO2: 32 mEq/L (ref 19–32)
Calcium: 8.3 mg/dL — ABNORMAL LOW (ref 8.4–10.5)
Calcium: 8.6 mg/dL (ref 8.4–10.5)
Calcium: 7.3 mg/dL — ABNORMAL LOW (ref 8.4–10.5)
Calcium: 8.1 mg/dL — ABNORMAL LOW (ref 8.4–10.5)
Calcium: 8.2 mg/dL — ABNORMAL LOW (ref 8.4–10.5)
Calcium: 8.2 mg/dL — ABNORMAL LOW (ref 8.4–10.5)
Calcium: 8.4 mg/dL (ref 8.4–10.5)
Calcium: 8.7 mg/dL (ref 8.4–10.5)
Chloride: 104 mEq/L (ref 96–112)
Chloride: 101 mEq/L (ref 96–112)
Chloride: 101 mEq/L (ref 96–112)
Chloride: 103 mEq/L (ref 96–112)
Chloride: 103 mEq/L (ref 96–112)
Chloride: 104 mEq/L (ref 96–112)
Chloride: 106 mEq/L (ref 96–112)
Chloride: 107 mEq/L (ref 96–112)
Creatinine, Ser: 1.53 mg/dL — ABNORMAL HIGH (ref 0.4–1.5)
Creatinine, Ser: 1.68 mg/dL — ABNORMAL HIGH (ref 0.4–1.5)
Creatinine, Ser: 1.61 mg/dL — ABNORMAL HIGH (ref 0.4–1.5)
Creatinine, Ser: 1.69 mg/dL — ABNORMAL HIGH (ref 0.4–1.5)
Creatinine, Ser: 1.7 mg/dL — ABNORMAL HIGH (ref 0.4–1.5)
Creatinine, Ser: 1.82 mg/dL — ABNORMAL HIGH (ref 0.4–1.5)
Creatinine, Ser: 1.83 mg/dL — ABNORMAL HIGH (ref 0.4–1.5)
Creatinine, Ser: 2.96 mg/dL — ABNORMAL HIGH (ref 0.4–1.5)
GFR calc Af Amer: 53 mL/min — ABNORMAL LOW (ref >60)
GFR calc Af Amer: 59 mL/min — ABNORMAL LOW (ref >60)
GFR calc Af Amer: 60 mL/min — ABNORMAL LOW (ref >60)
GFR calc Af Amer: 28 mL/min — ABNORMAL LOW (ref >60)
GFR calc Af Amer: 48 mL/min — ABNORMAL LOW (ref >60)
GFR calc Af Amer: 49 mL/min — ABNORMAL LOW (ref >60)
GFR calc Af Amer: 53 mL/min — ABNORMAL LOW (ref >60)
GFR calc Af Amer: 53 mL/min — ABNORMAL LOW (ref >60)
GFR calc Af Amer: 56 mL/min — ABNORMAL LOW (ref >60)
GFR calc non Af Amer: 44 mL/min — ABNORMAL LOW (ref >60)
GFR calc non Af Amer: 49 mL/min — ABNORMAL LOW (ref >60)
GFR calc non Af Amer: 23 mL/min — ABNORMAL LOW (ref >60)
GFR calc non Af Amer: 39 mL/min — ABNORMAL LOW (ref >60)
GFR calc non Af Amer: 40 mL/min — ABNORMAL LOW (ref >60)
GFR calc non Af Amer: 44 mL/min — ABNORMAL LOW (ref >60)
GFR calc non Af Amer: 44 mL/min — ABNORMAL LOW (ref >60)
GFR calc non Af Amer: 44 mL/min — ABNORMAL LOW (ref >60)
GFR calc non Af Amer: 46 mL/min — ABNORMAL LOW (ref >60)
Glucose, Bld: 102 mg/dL — ABNORMAL HIGH (ref 70–99)
Glucose, Bld: 105 mg/dL — ABNORMAL HIGH (ref 70–99)
Glucose, Bld: 106 mg/dL — ABNORMAL HIGH (ref 70–99)
Glucose, Bld: 114 mg/dL — ABNORMAL HIGH (ref 70–99)
Glucose, Bld: 116 mg/dL — ABNORMAL HIGH (ref 70–99)
Glucose, Bld: 117 mg/dL — ABNORMAL HIGH (ref 70–99)
Glucose, Bld: 120 mg/dL — ABNORMAL HIGH (ref 70–99)
Glucose, Bld: 131 mg/dL — ABNORMAL HIGH (ref 70–99)
Glucose, Bld: 98 mg/dL (ref 70–99)
Potassium: 4.2 mEq/L (ref 3.5–5.1)
Potassium: 3.7 mEq/L (ref 3.5–5.1)
Potassium: 3.7 mEq/L (ref 3.5–5.1)
Potassium: 4 mEq/L (ref 3.5–5.1)
Potassium: 4.1 mEq/L (ref 3.5–5.1)
Potassium: 4.4 mEq/L (ref 3.5–5.1)
Potassium: 4.7 mEq/L (ref 3.5–5.1)
Potassium: 5 mEq/L (ref 3.5–5.1)
Sodium: 140 mEq/L (ref 135–145)
Sodium: 135 mEq/L (ref 135–145)
Sodium: 137 mEq/L (ref 135–145)
Sodium: 137 mEq/L (ref 135–145)
Sodium: 138 mEq/L (ref 135–145)
Sodium: 138 mEq/L (ref 135–145)
Sodium: 139 mEq/L (ref 135–145)
Sodium: 140 mEq/L (ref 135–145)
Sodium: 143 mEq/L (ref 135–145)

## 2011-04-02 LAB — DIFFERENTIAL
Basophils Absolute: 0 10*3/uL (ref 0.0–0.1)
Basophils Absolute: 0 10*3/uL (ref 0.0–0.1)
Basophils Relative: 0 % (ref 0–1)
Basophils Relative: 0 % (ref 0–1)
Lymphocytes Relative: 21 % (ref 12–46)
Monocytes Absolute: 0.5 10*3/uL (ref 0.1–1.0)
Monocytes Relative: 10 % (ref 3–12)
Monocytes Relative: 11 % (ref 3–12)
Neutro Abs: 3.3 10*3/uL (ref 1.7–7.7)
Neutro Abs: 6.5 10*3/uL (ref 1.7–7.7)
Neutrophils Relative %: 62 % (ref 43–77)
Neutrophils Relative %: 76 % (ref 43–77)

## 2011-04-02 LAB — CROSSMATCH

## 2011-04-02 LAB — CARDIAC PANEL(CRET KIN+CKTOT+MB+TROPI)
CK, MB: 1.9 ng/mL (ref 0.3–4.0)
Relative Index: 1.7 (ref 0.0–2.5)
Relative Index: 3.2 — ABNORMAL HIGH (ref 0.0–2.5)
Total CK: 130 U/L (ref 7–232)
Troponin I: 1.3 ng/mL (ref 0.00–0.06)
Troponin I: 1.75 ng/mL (ref 0.00–0.06)
Troponin I: 1.94 ng/mL (ref 0.00–0.06)

## 2011-04-02 LAB — IRON AND TIBC
Iron: 15 ug/dL — ABNORMAL LOW (ref 42–135)
TIBC: 227 ug/dL (ref 215–435)
UIBC: 212 ug/dL

## 2011-04-02 LAB — APTT: aPTT: 33 seconds (ref 24–37)

## 2011-04-02 LAB — HEPARIN LEVEL (UNFRACTIONATED)
Heparin Unfractionated: 0.24 IU/mL — ABNORMAL LOW (ref 0.30–0.70)
Heparin Unfractionated: 0.29 IU/mL — ABNORMAL LOW (ref 0.30–0.70)

## 2011-04-02 LAB — URIC ACID: Uric Acid, Serum: 7.1 mg/dL (ref 4.0–7.8)

## 2011-04-02 LAB — POCT I-STAT, CHEM 8
BUN: 9 mg/dL (ref 6–23)
Creatinine, Ser: 1.4 mg/dL (ref 0.4–1.5)
Glucose, Bld: 100 mg/dL — ABNORMAL HIGH (ref 70–99)
Hemoglobin: 8.5 g/dL — ABNORMAL LOW (ref 13.0–17.0)
Sodium: 142 mEq/L (ref 135–145)
TCO2: 23 mmol/L (ref 0–100)

## 2011-04-02 LAB — URINALYSIS, ROUTINE W REFLEX MICROSCOPIC
Glucose, UA: NEGATIVE mg/dL
Leukocytes, UA: NEGATIVE
Specific Gravity, Urine: 1.019 (ref 1.005–1.030)
pH: 6 (ref 5.0–8.0)

## 2011-04-02 LAB — SEDIMENTATION RATE: Sed Rate: 102 mm/hr — ABNORMAL HIGH (ref 0–16)

## 2011-04-02 LAB — HEPARIN INDUCED THROMBOCYTOPENIA PNL
Heparin Induced Plt Ab: NEGATIVE
Serotonin Release: 0 % release (ref <20)

## 2011-04-02 LAB — FOLATE: Folate: 10.8 ng/mL

## 2011-04-02 LAB — URINE MICROSCOPIC-ADD ON

## 2011-04-02 LAB — FERRITIN: Ferritin: 192 ng/mL (ref 22–322)

## 2011-04-02 LAB — PROTIME-INR
INR: 1.1 (ref 0.00–1.49)
Prothrombin Time: 14.1 seconds (ref 11.6–15.2)

## 2011-04-02 LAB — MAGNESIUM: Magnesium: 2.7 mg/dL — ABNORMAL HIGH (ref 1.5–2.5)

## 2011-04-02 LAB — POCT CARDIAC MARKERS: Myoglobin, poc: 61.8 ng/mL (ref 12–200)

## 2011-04-03 LAB — POCT I-STAT 3, ART BLOOD GAS (G3+)
Acid-Base Excess: 1 mmol/L (ref 0.0–2.0)
Acid-Base Excess: 6 mmol/L — ABNORMAL HIGH (ref 0.0–2.0)
Acid-base deficit: 4 mmol/L — ABNORMAL HIGH (ref 0.0–2.0)
Bicarbonate: 21.7 mEq/L (ref 20.0–24.0)
Bicarbonate: 22.7 mEq/L (ref 20.0–24.0)
Bicarbonate: 25.8 mEq/L — ABNORMAL HIGH (ref 20.0–24.0)
Bicarbonate: 26.4 mEq/L — ABNORMAL HIGH (ref 20.0–24.0)
Bicarbonate: 26.7 mEq/L — ABNORMAL HIGH (ref 20.0–24.0)
Bicarbonate: 27.6 mEq/L — ABNORMAL HIGH (ref 20.0–24.0)
Bicarbonate: 30.6 mEq/L — ABNORMAL HIGH (ref 20.0–24.0)
O2 Saturation: 100 %
O2 Saturation: 100 %
O2 Saturation: 96 %
O2 Saturation: 97 %
O2 Saturation: 99 %
Patient temperature: 98.7
TCO2: 24 mmol/L (ref 0–100)
TCO2: 26 mmol/L (ref 0–100)
TCO2: 27 mmol/L (ref 0–100)
TCO2: 27 mmol/L (ref 0–100)
TCO2: 27 mmol/L (ref 0–100)
TCO2: 28 mmol/L (ref 0–100)
TCO2: 28 mmol/L (ref 0–100)
TCO2: 29 mmol/L (ref 0–100)
TCO2: 31 mmol/L (ref 0–100)
pCO2 arterial: 37.3 mmHg (ref 35.0–45.0)
pCO2 arterial: 39.2 mmHg (ref 35.0–45.0)
pCO2 arterial: 39.9 mmHg (ref 35.0–45.0)
pCO2 arterial: 40.2 mmHg (ref 35.0–45.0)
pCO2 arterial: 40.3 mmHg (ref 35.0–45.0)
pCO2 arterial: 41.5 mmHg (ref 35.0–45.0)
pCO2 arterial: 45.1 mmHg — ABNORMAL HIGH (ref 35.0–45.0)
pCO2 arterial: 46.7 mmHg — ABNORMAL HIGH (ref 35.0–45.0)
pCO2 arterial: 47.1 mmHg — ABNORMAL HIGH (ref 35.0–45.0)
pCO2 arterial: 50.4 mmHg — ABNORMAL HIGH (ref 35.0–45.0)
pCO2 arterial: 55.5 mmHg — ABNORMAL HIGH (ref 35.0–45.0)
pH, Arterial: 7.294 — ABNORMAL LOW (ref 7.350–7.450)
pH, Arterial: 7.324 — ABNORMAL LOW (ref 7.350–7.450)
pH, Arterial: 7.36 (ref 7.350–7.450)
pH, Arterial: 7.364 (ref 7.350–7.450)
pH, Arterial: 7.399 (ref 7.350–7.450)
pH, Arterial: 7.403 (ref 7.350–7.450)
pH, Arterial: 7.405 (ref 7.350–7.450)
pH, Arterial: 7.405 (ref 7.350–7.450)
pH, Arterial: 7.415 (ref 7.350–7.450)
pO2, Arterial: 203 mmHg — ABNORMAL HIGH (ref 80.0–100.0)
pO2, Arterial: 250 mmHg — ABNORMAL HIGH (ref 80.0–100.0)
pO2, Arterial: 250 mmHg — ABNORMAL HIGH (ref 80.0–100.0)
pO2, Arterial: 273 mmHg — ABNORMAL HIGH (ref 80.0–100.0)
pO2, Arterial: 64 mmHg — ABNORMAL LOW (ref 80.0–100.0)

## 2011-04-03 LAB — BASIC METABOLIC PANEL
BUN: 23 mg/dL (ref 6–23)
BUN: 14 mg/dL (ref 6–23)
CO2: 30 mEq/L (ref 19–32)
Calcium: 7.8 mg/dL — ABNORMAL LOW (ref 8.4–10.5)
Chloride: 109 mEq/L (ref 96–112)
Creatinine, Ser: 3.36 mg/dL — ABNORMAL HIGH (ref 0.4–1.5)
Creatinine, Ser: 1.12 mg/dL (ref 0.4–1.5)
GFR calc Af Amer: 24 mL/min — ABNORMAL LOW (ref >60)
GFR calc Af Amer: >60 mL/min (ref >60)
GFR calc non Af Amer: 20 mL/min — ABNORMAL LOW (ref >60)
GFR calc non Af Amer: >60 mL/min (ref >60)
Glucose, Bld: 148 mg/dL — ABNORMAL HIGH (ref 70–99)
Potassium: 4 mEq/L (ref 3.5–5.1)
Sodium: 148 mEq/L — ABNORMAL HIGH (ref 135–145)

## 2011-04-03 LAB — CBC
HCT: 21.3 % — ABNORMAL LOW (ref 39.0–52.0)
HCT: 24.7 % — ABNORMAL LOW (ref 39.0–52.0)
HCT: 24.9 % — ABNORMAL LOW (ref 39.0–52.0)
HCT: 26 % — ABNORMAL LOW (ref 39.0–52.0)
HCT: 43.3 % (ref 39.0–52.0)
Hemoglobin: 7.2 g/dL — CL (ref 13.0–17.0)
Hemoglobin: 8.3 g/dL — ABNORMAL LOW (ref 13.0–17.0)
Hemoglobin: 8.5 g/dL — ABNORMAL LOW (ref 13.0–17.0)
Hemoglobin: 8.8 g/dL — ABNORMAL LOW (ref 13.0–17.0)
Hemoglobin: 14.5 g/dL (ref 13.0–17.0)
MCHC: 33.5 g/dL (ref 30.0–36.0)
MCHC: 33.6 g/dL (ref 30.0–36.0)
MCHC: 33.7 g/dL (ref 30.0–36.0)
MCHC: 34.1 g/dL (ref 30.0–36.0)
MCV: 84.7 fL (ref 78.0–100.0)
MCV: 85.5 fL (ref 78.0–100.0)
MCV: 85.6 fL (ref 78.0–100.0)
MCV: 86.4 fL (ref 78.0–100.0)
MCV: 83.9 fL (ref 78.0–100.0)
Platelets: 128 10*3/uL — ABNORMAL LOW (ref 150–400)
Platelets: 74 10*3/uL — ABNORMAL LOW (ref 150–400)
Platelets: 88 10*3/uL — ABNORMAL LOW (ref 150–400)
Platelets: 90 10*3/uL — ABNORMAL LOW (ref 150–400)
Platelets: 90 10*3/uL — ABNORMAL LOW (ref 150–400)
Platelets: 140 10*3/uL — ABNORMAL LOW (ref 150–400)
RBC: 2.49 MIL/uL — ABNORMAL LOW (ref 4.22–5.81)
RBC: 2.86 MIL/uL — ABNORMAL LOW (ref 4.22–5.81)
RBC: 2.93 MIL/uL — ABNORMAL LOW (ref 4.22–5.81)
RBC: 3.04 MIL/uL — ABNORMAL LOW (ref 4.22–5.81)
RBC: 4.47 MIL/uL (ref 4.22–5.81)
RBC: 5.16 MIL/uL (ref 4.22–5.81)
RDW: 13.7 % (ref 11.5–15.5)
RDW: 13.9 % (ref 11.5–15.5)
RDW: 14 % (ref 11.5–15.5)
RDW: 14.6 % (ref 11.5–15.5)
RDW: 14.8 % (ref 11.5–15.5)
WBC: 10.4 10*3/uL (ref 4.0–10.5)
WBC: 10.8 10*3/uL — ABNORMAL HIGH (ref 4.0–10.5)
WBC: 11.4 10*3/uL — ABNORMAL HIGH (ref 4.0–10.5)
WBC: 6.2 10*3/uL (ref 4.0–10.5)
WBC: 7.1 10*3/uL (ref 4.0–10.5)
WBC: 4 10*3/uL (ref 4.0–10.5)
WBC: 8.1 10*3/uL (ref 4.0–10.5)

## 2011-04-03 LAB — PREPARE PLATELETS

## 2011-04-03 LAB — CROSSMATCH

## 2011-04-03 LAB — URINALYSIS, ROUTINE W REFLEX MICROSCOPIC
Bilirubin Urine: NEGATIVE
Bilirubin Urine: NEGATIVE
Glucose, UA: NEGATIVE mg/dL
Hgb urine dipstick: NEGATIVE
Ketones, ur: 15 mg/dL — AB
Nitrite: NEGATIVE
Protein, ur: NEGATIVE mg/dL
Specific Gravity, Urine: 1.024 (ref 1.005–1.030)
Specific Gravity, Urine: 1.013 (ref 1.005–1.030)
Urobilinogen, UA: 0.2 mg/dL (ref 0.0–1.0)
Urobilinogen, UA: 0.2 mg/dL (ref 0.0–1.0)

## 2011-04-03 LAB — POCT I-STAT, CHEM 8
BUN: 13 mg/dL (ref 6–23)
BUN: 20 mg/dL (ref 6–23)
BUN: 13 mg/dL (ref 6–23)
Calcium, Ion: 1.06 mmol/L — ABNORMAL LOW (ref 1.12–1.32)
Calcium, Ion: 1.22 mmol/L (ref 1.12–1.32)
Chloride: 106 mEq/L (ref 96–112)
Chloride: 107 mEq/L (ref 96–112)
Chloride: 109 mEq/L (ref 96–112)
Creatinine, Ser: 2.2 mg/dL — ABNORMAL HIGH (ref 0.4–1.5)
Creatinine, Ser: 2.7 mg/dL — ABNORMAL HIGH (ref 0.4–1.5)
Creatinine, Ser: 3.1 mg/dL — ABNORMAL HIGH (ref 0.4–1.5)
Creatinine, Ser: 1.6 mg/dL — ABNORMAL HIGH (ref 0.4–1.5)
Glucose, Bld: 101 mg/dL — ABNORMAL HIGH (ref 70–99)
Glucose, Bld: 128 mg/dL — ABNORMAL HIGH (ref 70–99)
Glucose, Bld: 134 mg/dL — ABNORMAL HIGH (ref 70–99)
Glucose, Bld: 96 mg/dL (ref 70–99)
HCT: 42 % (ref 39.0–52.0)
Hemoglobin: 14.3 g/dL (ref 13.0–17.0)
Hemoglobin: 7.5 g/dL — CL (ref 13.0–17.0)
Hemoglobin: 8.8 g/dL — ABNORMAL LOW (ref 13.0–17.0)
Hemoglobin: 15 g/dL (ref 13.0–17.0)
Potassium: 3.1 mEq/L — ABNORMAL LOW (ref 3.5–5.1)
Potassium: 3.9 mEq/L (ref 3.5–5.1)
Potassium: 4 mEq/L (ref 3.5–5.1)
Potassium: 5.3 mEq/L — ABNORMAL HIGH (ref 3.5–5.1)
Sodium: 141 mEq/L (ref 135–145)
Sodium: 143 mEq/L (ref 135–145)
Sodium: 146 mEq/L — ABNORMAL HIGH (ref 135–145)
Sodium: 141 mEq/L (ref 135–145)
TCO2: 20 mmol/L (ref 0–100)
TCO2: 28 mmol/L (ref 0–100)
TCO2: 27 mmol/L (ref 0–100)

## 2011-04-03 LAB — POCT I-STAT 4, (NA,K, GLUC, HGB,HCT)
Glucose, Bld: 110 mg/dL — ABNORMAL HIGH (ref 70–99)
Glucose, Bld: 111 mg/dL — ABNORMAL HIGH (ref 70–99)
Glucose, Bld: 113 mg/dL — ABNORMAL HIGH (ref 70–99)
Glucose, Bld: 138 mg/dL — ABNORMAL HIGH (ref 70–99)
Glucose, Bld: 142 mg/dL — ABNORMAL HIGH (ref 70–99)
Glucose, Bld: 145 mg/dL — ABNORMAL HIGH (ref 70–99)
Glucose, Bld: 145 mg/dL — ABNORMAL HIGH (ref 70–99)
Glucose, Bld: 176 mg/dL — ABNORMAL HIGH (ref 70–99)
HCT: 25 % — ABNORMAL LOW (ref 39.0–52.0)
HCT: 26 % — ABNORMAL LOW (ref 39.0–52.0)
HCT: 26 % — ABNORMAL LOW (ref 39.0–52.0)
HCT: 26 % — ABNORMAL LOW (ref 39.0–52.0)
HCT: 26 % — ABNORMAL LOW (ref 39.0–52.0)
HCT: 27 % — ABNORMAL LOW (ref 39.0–52.0)
HCT: 29 % — ABNORMAL LOW (ref 39.0–52.0)
HCT: 30 % — ABNORMAL LOW (ref 39.0–52.0)
HCT: 42 % (ref 39.0–52.0)
Hemoglobin: 12.6 g/dL — ABNORMAL LOW (ref 13.0–17.0)
Hemoglobin: 14.3 g/dL (ref 13.0–17.0)
Hemoglobin: 8.5 g/dL — ABNORMAL LOW (ref 13.0–17.0)
Hemoglobin: 8.8 g/dL — ABNORMAL LOW (ref 13.0–17.0)
Hemoglobin: 8.8 g/dL — ABNORMAL LOW (ref 13.0–17.0)
Hemoglobin: 8.8 g/dL — ABNORMAL LOW (ref 13.0–17.0)
Hemoglobin: 9.2 g/dL — ABNORMAL LOW (ref 13.0–17.0)
Potassium: 4.2 mEq/L (ref 3.5–5.1)
Potassium: 4.2 mEq/L (ref 3.5–5.1)
Potassium: 4.3 mEq/L (ref 3.5–5.1)
Potassium: 6 mEq/L — ABNORMAL HIGH (ref 3.5–5.1)
Potassium: 6.5 mEq/L (ref 3.5–5.1)
Potassium: 7.2 mEq/L (ref 3.5–5.1)
Sodium: 133 mEq/L — ABNORMAL LOW (ref 135–145)
Sodium: 136 mEq/L (ref 135–145)
Sodium: 139 mEq/L (ref 135–145)
Sodium: 140 mEq/L (ref 135–145)
Sodium: 140 mEq/L (ref 135–145)
Sodium: 143 mEq/L (ref 135–145)

## 2011-04-03 LAB — PREPARE FRESH FROZEN PLASMA

## 2011-04-03 LAB — POCT CARDIAC MARKERS
CKMB, poc: <1 ng/mL — ABNORMAL LOW (ref 1.0–8.0)
Myoglobin, poc: 53.6 ng/mL (ref 12–200)
Troponin i, poc: <0.05 ng/mL (ref 0.00–0.09)

## 2011-04-03 LAB — APTT: aPTT: 40 seconds — ABNORMAL HIGH (ref 24–37)

## 2011-04-03 LAB — URINE CULTURE

## 2011-04-03 LAB — POCT I-STAT 3, VENOUS BLOOD GAS (G3P V)
Bicarbonate: 24 mEq/L (ref 20.0–24.0)
Bicarbonate: 24 mEq/L (ref 20.0–24.0)
O2 Saturation: 70 %
O2 Saturation: 76 %
TCO2: 25 mmol/L (ref 0–100)
TCO2: 26 mmol/L (ref 0–100)
pCO2, Ven: 44 mmHg — ABNORMAL LOW (ref 45.0–50.0)
pCO2, Ven: 54.2 mmHg — ABNORMAL HIGH (ref 45.0–50.0)
pH, Ven: 7.254 (ref 7.250–7.300)
pH, Ven: 7.344 — ABNORMAL HIGH (ref 7.250–7.300)
pO2, Ven: 39 mmHg (ref 30.0–45.0)

## 2011-04-03 LAB — COMPREHENSIVE METABOLIC PANEL
ALT: 24 U/L (ref 0–53)
Calcium: 8.9 mg/dL (ref 8.4–10.5)
Glucose, Bld: 120 mg/dL — ABNORMAL HIGH (ref 70–99)
Sodium: 142 mEq/L (ref 135–145)
Total Protein: 6.9 g/dL (ref 6.0–8.3)

## 2011-04-03 LAB — GLUCOSE, CAPILLARY
Glucose-Capillary: 106 mg/dL — ABNORMAL HIGH (ref 70–99)
Glucose-Capillary: 114 mg/dL — ABNORMAL HIGH (ref 70–99)
Glucose-Capillary: 125 mg/dL — ABNORMAL HIGH (ref 70–99)
Glucose-Capillary: 127 mg/dL — ABNORMAL HIGH (ref 70–99)
Glucose-Capillary: 129 mg/dL — ABNORMAL HIGH (ref 70–99)
Glucose-Capillary: 130 mg/dL — ABNORMAL HIGH (ref 70–99)
Glucose-Capillary: 131 mg/dL — ABNORMAL HIGH (ref 70–99)
Glucose-Capillary: 136 mg/dL — ABNORMAL HIGH (ref 70–99)
Glucose-Capillary: 98 mg/dL (ref 70–99)

## 2011-04-03 LAB — CARDIAC PANEL(CRET KIN+CKTOT+MB+TROPI)
CK, MB: 2.3 ng/mL (ref 0.3–4.0)
Relative Index: 0.1 (ref 0.0–2.5)
Total CK: 3046 U/L — ABNORMAL HIGH (ref 7–232)
Total CK: 4263 U/L — ABNORMAL HIGH (ref 7–232)

## 2011-04-03 LAB — TROPONIN I: Troponin I: 0.03 ng/mL (ref 0.00–0.06)

## 2011-04-03 LAB — BRAIN NATRIURETIC PEPTIDE: Pro B Natriuretic peptide (BNP): 112 pg/mL — ABNORMAL HIGH (ref 0.0–100.0)

## 2011-04-03 LAB — DIFFERENTIAL
Basophils Absolute: 0 10*3/uL (ref 0.0–0.1)
Eosinophils Absolute: 0.1 10*3/uL (ref 0.0–0.7)
Eosinophils Relative: 3 % (ref 0–5)
Lymphocytes Relative: 31 % (ref 12–46)
Lymphocytes Relative: 44 % (ref 12–46)
Lymphs Abs: 1.8 10*3/uL (ref 0.7–4.0)
Monocytes Absolute: 0.4 10*3/uL (ref 0.1–1.0)
Monocytes Relative: 9 % (ref 3–12)
Neutro Abs: 4.2 10*3/uL (ref 1.7–7.7)

## 2011-04-03 LAB — PROTIME-INR
INR: 1.6 — ABNORMAL HIGH (ref 0.00–1.49)
Prothrombin Time: 20 seconds — ABNORMAL HIGH (ref 11.6–15.2)

## 2011-04-03 LAB — DIGOXIN LEVEL: Digoxin Level: 0.3 ng/mL — ABNORMAL LOW (ref 0.8–2.0)

## 2011-04-03 LAB — CREATININE, SERUM
Creatinine, Ser: 2.69 mg/dL — ABNORMAL HIGH (ref 0.4–1.5)
Creatinine, Ser: 3.16 mg/dL — ABNORMAL HIGH (ref 0.4–1.5)
GFR calc Af Amer: 26 mL/min — ABNORMAL LOW (ref >60)
GFR calc Af Amer: 31 mL/min — ABNORMAL LOW (ref >60)
GFR calc non Af Amer: 21 mL/min — ABNORMAL LOW (ref >60)
GFR calc non Af Amer: 26 mL/min — ABNORMAL LOW (ref >60)

## 2011-04-03 LAB — MAGNESIUM
Magnesium: 2.6 mg/dL — ABNORMAL HIGH (ref 1.5–2.5)
Magnesium: 2.7 mg/dL — ABNORMAL HIGH (ref 1.5–2.5)
Magnesium: 2.7 mg/dL — ABNORMAL HIGH (ref 1.5–2.5)

## 2011-04-03 LAB — LIPASE, BLOOD: Lipase: 19 U/L (ref 11–59)

## 2011-04-03 LAB — CK TOTAL AND CKMB (NOT AT ARMC): Total CK: 118 U/L (ref 7–232)

## 2011-04-04 LAB — CBC
HCT: 43.5 % (ref 39.0–52.0)
HCT: 44.3 % (ref 39.0–52.0)
Hemoglobin: 14.6 g/dL (ref 13.0–17.0)
Hemoglobin: 14.6 g/dL (ref 13.0–17.0)
Hemoglobin: 14.9 g/dL (ref 13.0–17.0)
MCHC: 33.6 g/dL (ref 30.0–36.0)
Platelets: 140 10*3/uL — ABNORMAL LOW (ref 150–400)
RBC: 5.13 MIL/uL (ref 4.22–5.81)
RBC: 5.16 MIL/uL (ref 4.22–5.81)
RBC: 5.27 MIL/uL (ref 4.22–5.81)
RDW: 14 % (ref 11.5–15.5)
WBC: 4.6 10*3/uL (ref 4.0–10.5)
WBC: 5 10*3/uL (ref 4.0–10.5)

## 2011-04-04 LAB — BASIC METABOLIC PANEL
CO2: 32 mEq/L (ref 19–32)
Calcium: 9.3 mg/dL (ref 8.4–10.5)
Chloride: 102 mEq/L (ref 96–112)
GFR calc Af Amer: >60 mL/min (ref >60)
GFR calc non Af Amer: >60 mL/min (ref >60)
Glucose, Bld: 131 mg/dL — ABNORMAL HIGH (ref 70–99)
Potassium: 3.7 mEq/L (ref 3.5–5.1)
Potassium: 4.1 mEq/L (ref 3.5–5.1)
Sodium: 134 mEq/L — ABNORMAL LOW (ref 135–145)
Sodium: 142 mEq/L (ref 135–145)

## 2011-04-04 LAB — URINALYSIS, ROUTINE W REFLEX MICROSCOPIC
Bilirubin Urine: NEGATIVE
Glucose, UA: NEGATIVE mg/dL
Glucose, UA: NEGATIVE mg/dL
Nitrite: NEGATIVE
Nitrite: NEGATIVE
Specific Gravity, Urine: 1.011 (ref 1.005–1.030)
Specific Gravity, Urine: 1.017 (ref 1.005–1.030)
pH: 5.5 (ref 5.0–8.0)
pH: 6.5 (ref 5.0–8.0)

## 2011-04-04 LAB — POCT CARDIAC MARKERS
CKMB, poc: <1 ng/mL — ABNORMAL LOW (ref 1.0–8.0)
Troponin i, poc: <0.05 ng/mL (ref 0.00–0.09)

## 2011-04-04 LAB — DIFFERENTIAL
Eosinophils Relative: 2 % (ref 0–5)
Lymphocytes Relative: 19 % (ref 12–46)
Monocytes Absolute: 0.2 10*3/uL (ref 0.1–1.0)
Monocytes Relative: 4 % (ref 3–12)
Neutro Abs: 3.7 10*3/uL (ref 1.7–7.7)

## 2011-04-10 ENCOUNTER — Other Ambulatory Visit: Payer: Self-pay | Admitting: Internal Medicine

## 2011-04-10 ENCOUNTER — Ambulatory Visit (INDEPENDENT_AMBULATORY_CARE_PROVIDER_SITE_OTHER): Payer: Medicaid Other | Admitting: Internal Medicine

## 2011-04-10 ENCOUNTER — Ambulatory Visit (INDEPENDENT_AMBULATORY_CARE_PROVIDER_SITE_OTHER): Payer: Medicaid Other | Admitting: *Deleted

## 2011-04-10 ENCOUNTER — Encounter: Payer: Self-pay | Admitting: Internal Medicine

## 2011-04-10 VITALS — BP 170/90 | HR 67 | Ht 71.0 in | Wt 197.8 lb

## 2011-04-10 DIAGNOSIS — I7103 Dissection of thoracoabdominal aorta: Secondary | ICD-10-CM

## 2011-04-10 DIAGNOSIS — Z95810 Presence of automatic (implantable) cardiac defibrillator: Secondary | ICD-10-CM

## 2011-04-10 DIAGNOSIS — C73 Malignant neoplasm of thyroid gland: Secondary | ICD-10-CM

## 2011-04-10 DIAGNOSIS — I5022 Chronic systolic (congestive) heart failure: Secondary | ICD-10-CM

## 2011-04-10 DIAGNOSIS — N182 Chronic kidney disease, stage 2 (mild): Secondary | ICD-10-CM | POA: Insufficient documentation

## 2011-04-10 DIAGNOSIS — I428 Other cardiomyopathies: Secondary | ICD-10-CM

## 2011-04-10 DIAGNOSIS — I10 Essential (primary) hypertension: Secondary | ICD-10-CM

## 2011-04-10 DIAGNOSIS — I2510 Atherosclerotic heart disease of native coronary artery without angina pectoris: Secondary | ICD-10-CM

## 2011-04-10 DIAGNOSIS — N189 Chronic kidney disease, unspecified: Secondary | ICD-10-CM

## 2011-04-10 MED ORDER — LISINOPRIL 20 MG PO TABS: 20.0000 mg | ORAL_TABLET | Freq: Every day | ORAL | Status: DC

## 2011-04-10 NOTE — Assessment & Plan Note (Signed)
Stressed need for f/u with Dr. Gerrit Friends. Provided him with contact info and called office for appt.

## 2011-04-10 NOTE — Assessment & Plan Note (Signed)
NYHA I. Volume status looks good. Restarting lisinopril. Interrogate device today.

## 2011-04-10 NOTE — Assessment & Plan Note (Signed)
BP remains elevated in setting of medication non-compliance this am. Instructed him to stop Toprol and just take carvedilol bid, Add lisinopril 20 qd. Long talk about importance of BP control to avoid stroke, dialysis, etc. Check BMET and BP next week with nurse visit.

## 2011-04-10 NOTE — Progress Notes (Signed)
icd check in clinic  

## 2011-04-10 NOTE — Progress Notes (Signed)
HPI:  Chad [name] is a very pleasant 50 year old male with history of severe HTN, coronary artery disease status post previous myocardial infarction and bypass surgery in 2006 with DES to native PDA in 2011.  He also has a history congestive heart failure secondary to ischemic cardiomyopathy with EF in 30-35% range. He is s/p single chamber ICD. 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 He underwent emergent repair of proximal aorta and reimplantation of his CABG grafts.  Carotids u/s in 2011 0-39% bilaterally with highly vascular lesion in left thyroid lobe. Biopsy showed Hurthle cell lesion. Saw Chad [name] who recommended resection but Chad [name] opted to defer.   Admitted with NSTEMI in 8/11. Left heart cardiac catheterization revealing 3/5 patent grafts with new occlusion of the vein graft to the PDA and occlusion of vein graft to the diagonal.  The patient had severe three-vessel coronary artery disease with a 99% distal stenosis in the RCA.  It was felt that the new occlusion of the vein graft to the PDA was likely the culprit and therefore the distal RCA was successfully stented with a 2.75-18 mm Promus drug-eluting stent. Was also admitted in 12/11 with HTN crisis and meds adjusted.   Here for routine f/u/ Says he is doing great. BP typically runs 130/80s. Did not take meds yet this morning. Occasional mild CP nothing severe. No significant edema. Takes lasix only on occasion. Taking hydralazine 75 bid. (instead of TID). No orthopnea or PND. Taking Plavix regularly. Taking Coreg at lunch and Toprol at night.   Tells me ICD is near end of life  ROS: All systems negative except as listed in HPI, PMH and Problem List.  Past Medical History  Diagnosis Date  . CAD (coronary artery disease)     s/p CABG 2006. DES to PDA 2011  . HTN (hypertension)     severe  . Chronic systolic heart failure     EF 62-13%  . Aortic dissection,  thoracoabdominal     Type I s/p repair  . CRI (chronic renal insufficiency)     Current Outpatient Prescriptions  Medication Sig Dispense Refill  . amLODipine (NORVASC) 10 MG tablet Take 10 mg by mouth daily.        [name] aspirin 325 MG tablet Take 325 mg by mouth daily. Patient splits tablet in fourths       . B Complex-C (B-COMPLEX WITH VITAMIN C) tablet Take 1 tablet by mouth daily.        . carvedilol (COREG) 25 MG tablet Take 25 mg by mouth 2 (two) times daily.       . clopidogrel (PLAVIX) 75 MG tablet Take 75 mg by mouth daily.        . digoxin (LANOXIN) 0.25 MG tablet Take 250 mcg by mouth daily.        . furosemide (LASIX) 80 MG tablet Take 80 mg by mouth as needed.        . hydrALAZINE (APRESOLINE) 50 MG tablet Take 50 mg by mouth 3 (three) times daily.        [name] HYDROcodone-acetaminophen (NORCO) 10-325 MG per tablet Take 1 tablet by mouth every 6 (six) hours as needed. May take every 4 to 6 hours as needed for severe pain       . isosorbide mononitrate (IMDUR) 30 MG 24 hr tablet Take 30 mg by mouth daily.        . metoprolol (TOPROL-XL)  200 MG 24 hr tablet Take 200 mg by mouth daily.        . nitroGLYCERIN (NITROSTAT) 0.4 MG SL tablet Place 0.4 mg under the tongue every 5 (five) minutes as needed.        . potassium chloride SA (K-DUR,KLOR-CON) 20 MEQ tablet Take 20 mEq by mouth. Take with lasix, take at least 1 a mth.      . amoxicillin (AMOXIL) 500 MG capsule Take 4 caps 1 hour before dental appointment.      . cetirizine (ZYRTEC) 10 MG tablet Take 10 mg by mouth daily.        [name] DISCONTD: amLODipine (NORVASC) 5 MG tablet Take 5 mg by mouth daily.       [name] DISCONTD: aspirin 81 MG chewable tablet Chew 81 mg by mouth daily.       [name] DISCONTD: simvastatin (ZOCOR) 40 MG tablet Take 40 mg by mouth at bedtime.           PHYSICAL EXAM: Filed Vitals:   04/10/11 1023  BP: 162/96  well appearing. No acute distress. HEENT: normal Neck: supple. no JVD. Carotids 2+ bilat; bilat bruits R>L.  No lymphadenopathy or thryomegaly appreciated. Cor: PMI laterally displaced. Regular rate & rhythm. No rubs, gallops, 2/6 AS murmur. S2 ok. soft mitral murmur. Large chest wall scar.  Lungs: clear Abdomen: soft, nontender, nondistended. No hepatosplenomegaly. No bruits or masses. Good bowel sounds. Extremities: no cyanosis, clubbing, rash, edema.  Neuro: alert & orientedx3, cranial nerves grossly intact.    ECG: NSR 67 LVH with repol. 1avb    ASSESSMENT & PLAN:

## 2011-04-10 NOTE — Patient Instructions (Addendum)
Stop Metoprlol Start Lisinopril 20mg  daily Your physician recommends that you return for lab work in: 1 week (bmet, bnp 428.22) BP check with an RN in 1 week You are scheduled to see Dr Gerrit Friends on May 14th at 2pm Follow up with Dr Ladona Ridgel ASAP

## 2011-04-10 NOTE — Assessment & Plan Note (Signed)
No evidence of ischemia. Continue current regimen. Plavix for at least 1 year.

## 2011-04-12 IMAGING — CR DG CHEST 1V PORT
1 series · 1 of 1 positions shown · non-contrast
Comparison: CT dated 09/07/2010

CLINICAL DATA: Chest pain.  History of aortic dissection

PORTABLE CHEST - 1 VIEW

[view not recorded]
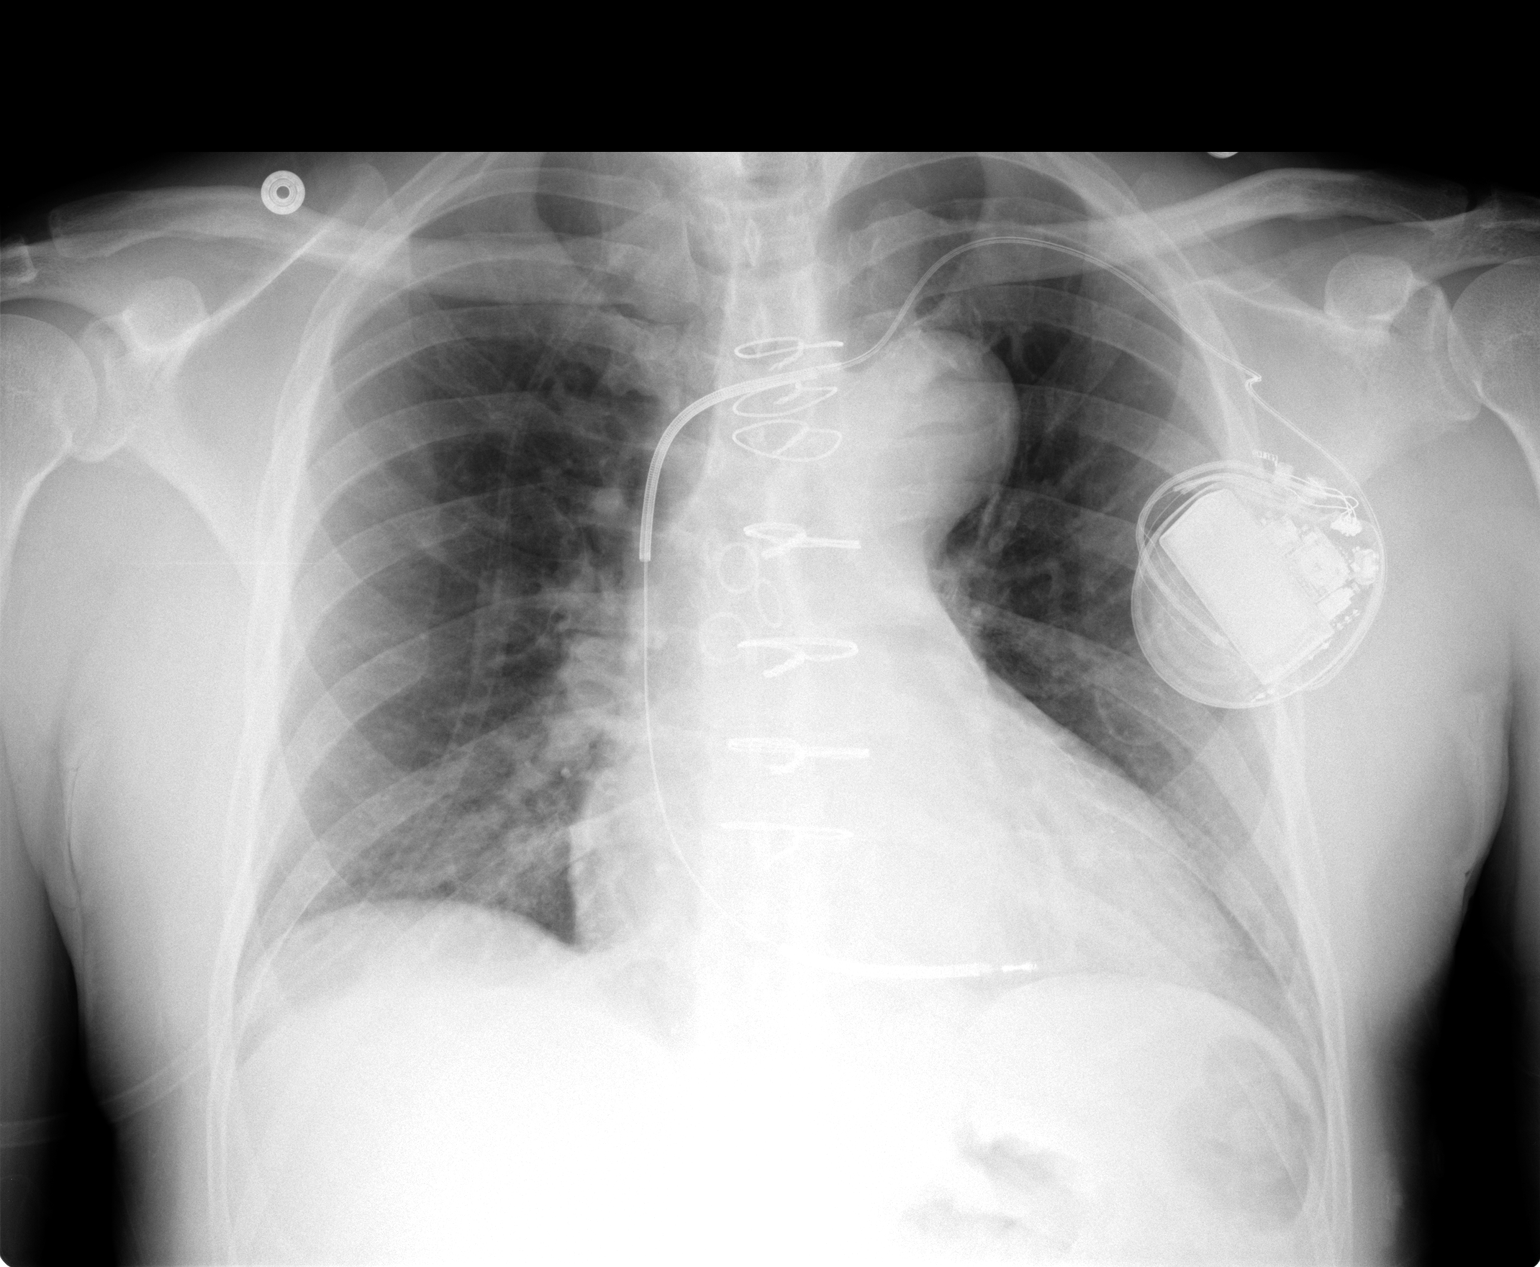

[1 of 1 positions shown; findings below may reference images not displayed]

FINDINGS: The heart is enlarged but unchanged.  There is prominence
of the aortic arch and descending aorta, also unchanged.  The left
chest wall AICD is in place.  Bibasilar atelectasis is noted
without confluent airspace masses, edema or large effusions.  The
upper abdomen and osseous structures are unchanged.
IMPRESSION: Cardiomegaly and unchanged prominence of the aorta as detailed
above compared with given history of dissection.  No interval
change in the contour of the aorta since July 2010.

## 2011-04-13 ENCOUNTER — Ambulatory Visit: Payer: Medicaid Other | Admitting: Internal Medicine

## 2011-04-15 ENCOUNTER — Other Ambulatory Visit: Payer: Self-pay | Admitting: *Deleted

## 2011-04-15 MED ORDER — CLOPIDOGREL BISULFATE 75 MG PO TABS: 75.0000 mg | ORAL_TABLET | Freq: Every day | ORAL | Status: DC

## 2011-04-15 MED ORDER — DIGOXIN 250 MCG PO TABS: 250.0000 ug | ORAL_TABLET | Freq: Every day | ORAL | Status: DC

## 2011-04-17 ENCOUNTER — Emergency Department (HOSPITAL_COMMUNITY): Payer: Medicaid Other

## 2011-04-17 ENCOUNTER — Telehealth: Payer: Self-pay | Admitting: Internal Medicine

## 2011-04-17 ENCOUNTER — Inpatient Hospital Stay (HOSPITAL_COMMUNITY)
Admission: EM | Admit: 2011-04-17 | Discharge: 2011-04-18 | DRG: 313 | Disposition: A | Discharge status: Home / Self Care | Payer: Medicaid Other | Attending: Internal Medicine | Admitting: Internal Medicine

## 2011-04-17 DIAGNOSIS — I5022 Chronic systolic (congestive) heart failure: Secondary | ICD-10-CM | POA: Diagnosis present

## 2011-04-17 DIAGNOSIS — Z9861 Coronary angioplasty status: Secondary | ICD-10-CM

## 2011-04-17 DIAGNOSIS — Z7982 Long term (current) use of aspirin: Secondary | ICD-10-CM

## 2011-04-17 DIAGNOSIS — M109 Gout, unspecified: Secondary | ICD-10-CM | POA: Diagnosis present

## 2011-04-17 DIAGNOSIS — G8929 Other chronic pain: Secondary | ICD-10-CM | POA: Diagnosis present

## 2011-04-17 DIAGNOSIS — Z95810 Presence of automatic (implantable) cardiac defibrillator: Secondary | ICD-10-CM

## 2011-04-17 DIAGNOSIS — I509 Heart failure, unspecified: Secondary | ICD-10-CM | POA: Diagnosis present

## 2011-04-17 DIAGNOSIS — I2589 Other forms of chronic ischemic heart disease: Secondary | ICD-10-CM | POA: Diagnosis present

## 2011-04-17 DIAGNOSIS — M549 Dorsalgia, unspecified: Secondary | ICD-10-CM | POA: Diagnosis present

## 2011-04-17 DIAGNOSIS — Z7902 Long term (current) use of antithrombotics/antiplatelets: Secondary | ICD-10-CM

## 2011-04-17 DIAGNOSIS — D649 Anemia, unspecified: Secondary | ICD-10-CM | POA: Diagnosis present

## 2011-04-17 DIAGNOSIS — I129 Hypertensive chronic kidney disease with stage 1 through stage 4 chronic kidney disease, or unspecified chronic kidney disease: Secondary | ICD-10-CM | POA: Diagnosis present

## 2011-04-17 DIAGNOSIS — D497 Neoplasm of unspecified behavior of endocrine glands and other parts of nervous system: Secondary | ICD-10-CM | POA: Diagnosis present

## 2011-04-17 DIAGNOSIS — I25810 Atherosclerosis of coronary artery bypass graft(s) without angina pectoris: Secondary | ICD-10-CM | POA: Diagnosis present

## 2011-04-17 DIAGNOSIS — R079 Chest pain, unspecified: Secondary | ICD-10-CM

## 2011-04-17 DIAGNOSIS — Z79899 Other long term (current) drug therapy: Secondary | ICD-10-CM

## 2011-04-17 DIAGNOSIS — I2510 Atherosclerotic heart disease of native coronary artery without angina pectoris: Secondary | ICD-10-CM | POA: Diagnosis present

## 2011-04-17 DIAGNOSIS — I252 Old myocardial infarction: Secondary | ICD-10-CM

## 2011-04-17 DIAGNOSIS — E785 Hyperlipidemia, unspecified: Secondary | ICD-10-CM | POA: Diagnosis present

## 2011-04-17 DIAGNOSIS — R0789 Other chest pain: Principal | ICD-10-CM | POA: Diagnosis present

## 2011-04-17 DIAGNOSIS — N183 Chronic kidney disease, stage 3 unspecified: Secondary | ICD-10-CM | POA: Diagnosis present

## 2011-04-17 LAB — COMPREHENSIVE METABOLIC PANEL
ALT: 30 U/L (ref 0–53)
Alkaline Phosphatase: 70 U/L (ref 39–117)
CO2: 26 mEq/L (ref 19–32)
GFR calc non Af Amer: 53 mL/min — ABNORMAL LOW (ref >60)
Glucose, Bld: 118 mg/dL — ABNORMAL HIGH (ref 70–99)
Potassium: 3.8 mEq/L (ref 3.5–5.1)
Sodium: 139 mEq/L (ref 135–145)

## 2011-04-17 LAB — PROTIME-INR: Prothrombin Time: 13.8 seconds (ref 11.6–15.2)

## 2011-04-17 LAB — CBC
HCT: 38.8 % — ABNORMAL LOW (ref 39.0–52.0)
Hemoglobin: 12.9 g/dL — ABNORMAL LOW (ref 13.0–17.0)
MCH: 27 pg (ref 26.0–34.0)
MCHC: 33.2 g/dL (ref 30.0–36.0)

## 2011-04-17 LAB — CK TOTAL AND CKMB (NOT AT ARMC): Total CK: 45 U/L (ref 7–232)

## 2011-04-17 LAB — POCT I-STAT, CHEM 8
Creatinine, Ser: 1.6 mg/dL — ABNORMAL HIGH (ref 0.4–1.5)
Glucose, Bld: 117 mg/dL — ABNORMAL HIGH (ref 70–99)
Hemoglobin: 13.6 g/dL (ref 13.0–17.0)
TCO2: 22 mmol/L (ref 0–100)

## 2011-04-17 NOTE — Telephone Encounter (Signed)
Spoke with patient's wife. [name] has been experiencing CP all day so she called EMS. She wanted Dr. Gala Romney to be aware. I will route this to him.

## 2011-04-17 NOTE — Telephone Encounter (Signed)
[name]   Called to inform dr bensimhon pt is going to the [facility].

## 2011-04-18 ENCOUNTER — Other Ambulatory Visit: Payer: Medicaid Other | Admitting: *Deleted

## 2011-04-18 ENCOUNTER — Ambulatory Visit: Payer: Medicaid Other

## 2011-04-18 LAB — LIPID PANEL
Cholesterol: 159 mg/dL (ref 0–200)
LDL Cholesterol: 107 mg/dL — ABNORMAL HIGH (ref 0–99)
Triglycerides: 57 mg/dL (ref <150)

## 2011-04-18 LAB — CARDIAC PANEL(CRET KIN+CKTOT+MB+TROPI)
CK, MB: 1.6 ng/mL (ref 0.3–4.0)
CK, MB: 1.3 ng/mL (ref 0.3–4.0)
Relative Index: 1.3 (ref 0.0–2.5)
Relative Index: 1.1 (ref 0.0–2.5)
Total CK: 117 U/L (ref 7–232)

## 2011-04-18 LAB — TSH: TSH: 0.76 u[IU]/mL (ref 0.350–4.500)

## 2011-04-18 LAB — BASIC METABOLIC PANEL
CO2: 26 mEq/L (ref 19–32)
Calcium: 8.1 mg/dL — ABNORMAL LOW (ref 8.4–10.5)
Chloride: 110 mEq/L (ref 96–112)
Glucose, Bld: 111 mg/dL — ABNORMAL HIGH (ref 70–99)
Sodium: 139 mEq/L (ref 135–145)

## 2011-04-18 LAB — CBC
HCT: 35.6 % — ABNORMAL LOW (ref 39.0–52.0)
RDW: 13.9 % (ref 11.5–15.5)
WBC: 3.7 10*3/uL — ABNORMAL LOW (ref 4.0–10.5)

## 2011-04-20 ENCOUNTER — Other Ambulatory Visit: Payer: Self-pay | Admitting: *Deleted

## 2011-04-20 MED ORDER — ISOSORBIDE MONONITRATE ER 30 MG PO TB24: 30.0000 mg | ORAL_TABLET | Freq: Every day | ORAL | Status: DC

## 2011-04-25 ENCOUNTER — Encounter: Payer: Self-pay | Admitting: *Deleted

## 2011-04-25 NOTE — H&P (Signed)
NAME:  Chad [name], Chad [name] NO.:  [id number]  MEDICAL RECORD NO.:  [id number]           PATIENT TYPE:  I  LOCATION:  3732                         FACILITY:  [facility]  PHYSICIAN:  Chad [name]. Tais Koestner, MDDATE OF BIRTH:  1961/09/29  DATE OF ADMISSION:  04/17/2011 DATE OF DISCHARGE:                             HISTORY & PHYSICAL   PRIMARY CARDIOLOGIST:  Chad [name]. Chad Nickson, MD  PATIENT PROFILE:  A 50 year old African American male with prior history of coronary artery disease status post coronary artery bypass grafting as well as a type A aortic dissection status post mitral repair in June 2010.  He presents with recurrent chest pain.  PROBLEMS: 1. Unstable angina/coronary artery disease.     a.     Status post coronary artery bypass grafting in 2006 with      placement of left internal mammary artery to the left anterior      descending, vein graft to the RV marginal and PDA, vein graft to      the diagonal, OM1, and OM2.     b.     Cardiac catheterization in June 2010 revealing patent      grafts.     c.     August 12, 2010, non-ST-segment elevation myocardial      infarction with catheterization at that time revealing a new      occlusion of the vein graft to the PDA and occlusion of the vein      graft to the diagonal with native severe three-vessel disease      including a 99% distal stenosis in the RCA.  It was felt that the      vein graft to PDA lesion was the culprit and the distal RCA was      stented with 2.75 x 18 mm PROMUS drug-eluting stent. 2. History of type 1 aortic dissection in June 2010 status post mitral     repair. 3. Ischemic cardiomyopathy with EF 35%, status post AICD. 4. Hypertension. 5. Hyperlipidemia. 6. Anemia. 7. Stage III chronic kidney disease. 8. Chronic back pain. 9. Gout. 10.Chronic systolic congestive heart failure. 11.Thyroid neoplasm for which the patient deferred surgical evaluation     with Dr. Gerrit [name].  ALLERGIES:  IV  CONTRAST, SULFA, LASIX, LIPITOR, SHELLFISH.  HISTORY OF PRESENT ILLNESS:  A 50 year old African American male with the above problem list who was in his usual state of health until approximately 2:30 this afternoon when he was lying in bed and had sudden onset of 10/10 substernal chest pressure and squeezing, feeling as though his sides were giving in associated with dyspnea, diaphoresis, nausea, and general malaise.  The patient laid on the floor for about 20 minutes and called out to his daughter who called the EMS.  Upon EMS arrival, he was eventually given some nitroglycerin.  He was taken to [facility] ED and after a fourth nitroglycerin, he did have significant relief down to 2/10.  He was subsequently given morphine with complete relief of pain.  His ECG is nonacute and so far his enzymes are negative.  He is currently pain free.  HOME MEDICATIONS: 1. Amlodipine 10  mg daily. 2. Aspirin 81 mg daily. 3. Vitamin B complex with C daily. 4. Coreg 25 b.i.d. 5. Plavix 75 mg daily. 6. Digoxin 0.125 mg daily. 7. Lasix 80 mg p.r.n. 8. Hydralazine 50 mg t.i.d. 9. Hydrocodone/acetaminophen 10/325 mg q.4-6 h. p.r.n. 10.Imdur 30 mg daily. 11.Potassium as needed when taking Lasix. 12.Zyrtec 10 mg daily. 13.Lisinopril 20 mg daily.  FAMILY HISTORY:  The patient's mother is age 70 with hypertension. Father died at 59 from an MI.  He has a brother in his early 81s with history of coronary artery disease.  SOCIAL HISTORY:  The patient lives in [place] with his daughter.  He denies tobacco, alcohol, or drug use.  He does not routinely exercise.  REVIEW OF SYSTEMS:  Positive for chest pain, diaphoresis, dyspnea, nausea, general malaise as outlined in the HPI.  He has chronic back pain for which he takes Vicodin.  He is full code.  Rest of the systems reviewed and negative.  PHYSICAL EXAMINATION:  VITAL SIGNS:  Temperature 98.1, heart rate 86, respirations 16, blood pressure 134/68,  pulse ox 100% on 2 L. GENERAL:  Pleasant African American male in no acute distress.  Awake, alert and oriented x3.  He has a normal affect. HEENT:  Normal.  Nares grossly intact.  Nonfocal. SKIN:  Warm and dry without lesions or masses. NECK:  Supple without bruits or JVD. LUNGS:  Respirations are regular and unlabored.  Clear to auscultation. CARDIAC:  Regular S1 and S2.  No S3, S4, or murmurs. ABDOMEN:  Round, soft, nontender, nondistended.  Bowel sounds present x4. EXTREMITIES:  Warm, dry.  No clubbing, cyanosis, or edema.  Dorsalis pedis and posterior tibial pulses 2+ and equal bilaterally.  Chest x-ray shows stable cardiomegaly with pacer with ICD.  No active lung disease.  EKG shows sinus rhythm, rate of 79, left axis, left atrial enlargement, inferolateral T-wave inversion, which is old.  Poor R-wave progression.  Hemoglobin 12.9, hematocrit 38.8, WBC 3.9, platelets 150.  Sodium 139, potassium 3.8, chloride 108, CO2 of 26, BUN 13, creatinine 1.43, glucose 118, AST 61, ALT 30, total protein 6.5, albumin 3.8, CK-MB less than 0.0, troponin-I less than 0.05, calcium 8.9.  ASSESSMENT/PLAN: 1. Chest pain.  The patient had nitrate responsive chest pain which he     reports as being different from prior angina, but quite severe and     associated with shortness of breath, diaphoresis, general malaise.     He is feeling better now after total of 4 nitro's and morphine.     Total duration of symptoms were about 1 hour.  Enzymes negative so     far.  Plan to admit cycle enzymes further.  Continue home     medications.  Enzymes positive, has recurrent chest pain, we would     consider catheterization. 2. Hypertension stable. 3. Hyperlipidemia.  He is prior statin intolerant. 4. Chronic back pain.  Continue home dose of Vicodin.     Chad [name], ANP   ______________________________ Chad [name]. Chad Nickolson, MD    CB/MEDQ  D:  04/17/2011  T:  04/18/2011  Job:   [id number]  Electronically Signed by Chad [name] ANP on 04/22/2011 04:03:12 PM Electronically Signed by Chad Meres MD on 04/25/2011 07:14:55 PM

## 2011-04-25 NOTE — Discharge Summary (Addendum)
NAME:  Chad [name], Chad [name] NO.:  [id number]  MEDICAL RECORD NO.:  [id number]           PATIENT TYPE:  I  LOCATION:  3732                         FACILITY:  [facility]  PHYSICIAN:  Bevelyn Buckles. Kyndle Schlender, MDDATE OF BIRTH:  03/15/1961  DATE OF ADMISSION:  04/17/2011 DATE OF DISCHARGE:  04/18/2011                              DISCHARGE SUMMARY   DISCHARGE DIAGNOSES: 1. Chest pain, suspected musculoskeletal.     a.     Cardiac enzymes negative x4. 2. Coronary artery disease.     a.     Status post coronary artery bypass graft in 2006 with left      internal mammary artery to the left anterior descending, vein      graft to the right ventricular marginal and posterior descending      artery and vein graft to the diagonal, obtuse marginal 1 and      obtuse marginal 2.     b.     Cardiac cath in June 2010 revealing patent grafts.     c.     August 12, 2010, status post non-ST-segment elevation      myocardial infarction with cath at that time revealing new      occluded vein graft to the posterior descending artery and      occlusion of the vein graft to the diagonal with native severe      three-vessel disease including 99% distal stenosis in the right      coronary artery.  The vein graft to the posterior descending      artery lesion was felt to be the culprit and the distal right      coronary artery was stented with a Promus drug-eluting stent. 3. History of type 1 aortic dissection in June 2010 status post     repair. 4. Ischemic cardiomyopathy, ejection fraction of 35% status post     automatic implantable cardioverter-defibrillator. 5. Hypertension. 6. Hyperlipidemia. 7. Anemia. 8. Stage III chronic kidney disease. 9. Chronic back pain. 10.Gout. 11.Thyroid neoplasm, for which the patient deferred surgical     evaluation with Dr. Gerrit Friends. 12.IV DYE allergy.  HOSPITAL COURSE:  Chad [name] is a 50 year old gentleman with history of known ischemic cardiomyopathy  and CAD who presented to [facility] with chest discomfort which he reported as different from his prior anginal pain, associated with nausea, general malaise, diaphoresis.  The patient subsequently called EMS and was taken to [facility].  After four nitroglycerin, he had relief, felt pain down to 2/10.  He was given morphine with complete relief of pain.  EKG was nonacute, enzymes were negative.  His home medications were continued. He was admitted to the hospital for rule out and cardiac enzymes were cycled which were negative x4.  EKG remained nonacute.  Dr. Gala Romney felt that the pain was most likely musculoskeletal in nature.  His LDL was elevated, so a statin was added.  He has a history of intolerance to Zocor and Lipitor, so he will be followed closely on this medicine.  Dr. Gala Romney had seen and examined him today and feels he is stable for discharge.  DISCHARGE LABORATORY DATA:  WBC 3.7, hemoglobin 11.8, hematocrit 35.6, platelet count 138.  Sodium 139, potassium 3.6, chloride 110, CO2 is 26, glucose 111, BUN 10, creatinine 1.25.  Cardiac enzymes negative x4. Total cholesterol 159, triglycerides 57, HDL 41, LDL 107.  TSH 0.760.  STUDIES:  Chest x-ray, April 17, 2011, showed stable cardiomegaly with pacer.  No active lung disease.  DISCHARGE MEDICATIONS: 1. Pravastatin 20 mg nightly. 2. Aspirin 325 mg half tablet daily. 3. Amlodipine 10 mg daily. 4. Coreg 25 mg b.i.d. 5. Digoxin 0.25 mg daily. 6. Furosemide 80 mg daily as needed for excess fluid/swelling. 7. Hydralazine 50 mg t.i.d. 8. Hydrocodone/APAP 10/325 mg two tablets q.6 h. p.r.n. pain. 9. Imdur 30 mg daily. 10.Lisinopril 20 mg daily. 11.Nitroglycerin sublingual 0.4 mg every 5 minutes as needed up to     three doses for chest pain. 12.Plavix 75 mg daily. 13.Potassium chloride 20 mEq one tablet daily as needed only when he     takes Lasix.  DISPOSITION:  Chad [name] will be discharged in  stable condition to home.  He is instructed to increase activity slowly and may walk up steps and may shower and bathe.  He is to follow a low-salt heart- healthy diet and to follow up with Dr. Prescott Gum office with Tereso Newcomer, PA-C, on May 03, 2011, at 10:30 a.m.  He is also given explicit instructions regarding daily weights and notifying our office for increasing symptoms.  DURATION OF DISCHARGE ENCOUNTER:  Greater than 30 minutes including physician and PA time.     Dayna Dunn, P.A.C.   ______________________________ Bevelyn Buckles. Tiffony Kite, MD    DD/MEDQ  D:  04/18/2011  T:  04/19/2011  Job:  [id number]  Electronically Signed by Ronie Spies  on 04/25/2011 04:36:09 PM Electronically Signed by Arvilla Meres MD on 04/25/2011 07:14:51 PM

## 2011-04-29 ENCOUNTER — Encounter: Payer: Self-pay | Admitting: Physician Assistant

## 2011-05-02 ENCOUNTER — Encounter: Payer: Self-pay | Admitting: Physician Assistant

## 2011-05-03 ENCOUNTER — Encounter: Payer: Self-pay | Admitting: Physician Assistant

## 2011-05-03 ENCOUNTER — Ambulatory Visit (INDEPENDENT_AMBULATORY_CARE_PROVIDER_SITE_OTHER): Payer: Medicaid Other | Admitting: Physician Assistant

## 2011-05-03 VITALS — BP 149/80 | HR 53 | Ht 72.0 in | Wt 191.0 lb

## 2011-05-03 DIAGNOSIS — I10 Essential (primary) hypertension: Secondary | ICD-10-CM

## 2011-05-03 DIAGNOSIS — E785 Hyperlipidemia, unspecified: Secondary | ICD-10-CM

## 2011-05-03 DIAGNOSIS — R001 Bradycardia, unspecified: Secondary | ICD-10-CM | POA: Insufficient documentation

## 2011-05-03 DIAGNOSIS — I25810 Atherosclerosis of coronary artery bypass graft(s) without angina pectoris: Secondary | ICD-10-CM

## 2011-05-03 DIAGNOSIS — I498 Other specified cardiac arrhythmias: Secondary | ICD-10-CM

## 2011-05-03 LAB — BASIC METABOLIC PANEL
Chloride: 101 mEq/L (ref 96–112)
GFR: 72.44 mL/min (ref >60.00)
Potassium: 4.4 mEq/L (ref 3.5–5.1)
Sodium: 138 mEq/L (ref 135–145)

## 2011-05-03 MED ORDER — POTASSIUM CHLORIDE CRYS ER 20 MEQ PO TBCR: 20.0000 meq | EXTENDED_RELEASE_TABLET | Freq: Every day | ORAL | Status: DC

## 2011-05-03 MED ORDER — CARVEDILOL 25 MG PO TABS: 25.0000 mg | ORAL_TABLET | Freq: Two times a day (BID) | ORAL | Status: DC

## 2011-05-03 MED ORDER — FUROSEMIDE 80 MG PO TABS: 80.0000 mg | ORAL_TABLET | ORAL | Status: DC | PRN

## 2011-05-03 MED ORDER — NITROGLYCERIN 0.4 MG SL SUBL: 0.4000 mg | SUBLINGUAL_TABLET | SUBLINGUAL | Status: DC | PRN

## 2011-05-03 NOTE — Assessment & Plan Note (Addendum)
He is not taking coreg and toprol together.  Will decrease digoxin to 0.25 1/2 tab QD.  This may explain his occasional dyspnea.  Check ECG in 2 weeks.  Check BMET and Dig level.

## 2011-05-03 NOTE — Assessment & Plan Note (Signed)
Return fasting to next OV for FLP and LFTs.

## 2011-05-03 NOTE — Progress Notes (Signed)
History of Present Illness: Primary Cardiologist:  Dr. Arvilla Meres  Chad [name] is a 50 y.o. male with history of severe HTN, coronary artery disease status post previous myocardial infarction and bypass surgery in 2006 with DES to native PDA in 2011.  He also has a history congestive heart failure secondary to ischemic cardiomyopathy with EF in 30-35% range.  He is s/p single chamber ICD.  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 He underwent emergent repair of proximal aorta and reimplantation of his CABG grafts.  Carotids u/s in 2011 0-39% bilaterally with highly vascular lesion in left thyroid lobe.  Biopsy showed Hurthle cell lesion.  Saw Chad [name] who recommended resection but [name] opted to defer.   He was admitted 4/23-4/24 with chest pain.  Myocardial infarction was ruled out.  Dr. Gala Romney felt that this was musculoskeletal type chest pain.  Pravastatin was added to his medications.  He has been intolerant to Zocor and Lipitor in the past.  He returns for follow up.  Labs: Na 139, K 3.6, Creat 1.25, Hgb 11.8, TC 159, TG 57, HDL 41, LDL 107, AST 61, ALT 30, CE's neg x 3, TSH 0.760  Denies further chest pain.  He is convinced he had a heart attack.  I tried to reassure him about this today.  He notes some episodes of dyspnea.  They are quite atypical.  They come and go.  Sometimes associated with exertion and sometimes not.  He denies orthopnea, PND or edema.  No weight gain.  No syncope or recent near syncope.    Past Medical History  Diagnosis Date  . CAD (coronary artery disease)     a. s/p CABG 2006;  b. DES to PDA 2011 (cath: Dx not seen, dRCA/PDA tx with DES; S-PDA occluded (culprit), S-Dx occluded, S-RI and OM ok, L-LAD ok  . HTN (hypertension)     severe  . Chronic systolic heart failure     a. echo 8/11: EF 35%, mod LVH, Grade 1 diast dysfxn, mild AI, mild MR, inf and post HK  . Aortic dissection,  thoracoabdominal     7/10: Type I s/p repair  . CRI (chronic renal insufficiency)   . Thyroid cancer     Hertle Cell  . HLD (hyperlipidemia)   . Anemia   . Gout   . AICD (automatic cardioverter/defibrillator) present   . Carotid stenosis     dopplers 2011: 0-39% bilat.    Current Outpatient Prescriptions  Medication Sig Dispense Refill  . amLODipine (NORVASC) 10 MG tablet Take 10 mg by mouth daily.        [name] aspirin 325 MG tablet Take 325 mg by mouth daily. Patient splits tablet in fourths       . B Complex-C (B-COMPLEX WITH VITAMIN C) tablet Take 1 tablet by mouth daily.        . carvedilol (COREG) 25 MG tablet Take 25 mg by mouth 2 (two) times daily.       . cetirizine (ZYRTEC) 10 MG tablet Take 10 mg by mouth daily.        . clopidogrel (PLAVIX) 75 MG tablet Take 1 tablet (75 mg total) by mouth daily.  30 tablet  6  . digoxin (LANOXIN) 0.25 MG tablet Take 1 tablet (250 mcg total) by mouth daily.  30 tablet  6  . furosemide (LASIX) 80 MG tablet Take 80 mg by mouth as needed.        [name]  hydrALAZINE (APRESOLINE) 50 MG tablet Take 50 mg by mouth 3 (three) times daily.        [name] HYDROcodone-acetaminophen (NORCO) 10-325 MG per tablet Take 1 tablet by mouth every 6 (six) hours as needed. May take every 4 to 6 hours as needed for severe pain       . isosorbide mononitrate (IMDUR) 30 MG 24 hr tablet Take 1 tablet (30 mg total) by mouth daily.  30 tablet  6  . lisinopril (PRINIVIL,ZESTRIL) 20 MG tablet Take 1 tablet (20 mg total) by mouth daily.  30 tablet  6  . nitroGLYCERIN (NITROSTAT) 0.4 MG SL tablet Place 0.4 mg under the tongue every 5 (five) minutes as needed.        . potassium chloride SA (K-DUR,KLOR-CON) 20 MEQ tablet Take 20 mEq by mouth. Take with lasix, take at least 1 a mth.      . pravastatin (PRAVACHOL) 20 MG tablet Take 20 mg by mouth daily.        [name] DISCONTD: metoprolol (TOPROL-XL) 200 MG 24 hr tablet Take 200 mg by mouth daily.        [name] amoxicillin (AMOXIL) 500 MG capsule Take  4 caps 1 hour before dental appointment.        Allergies  Allergen Reactions  . Iohexol      Desc: PT HAS ANAPHYLAXIS WITH CONTRAST MEDIA!, Onset Date: [id number]   Code: SOB, Desc: ok w/ 13hr prep//a.c., Onset Date: 69629528   . Latex     REACTION: Reaction not known  . Sulfonamide Derivatives     Vital Signs: BP 149/80  Pulse 53  Ht 6' (1.829 m)  Wt 191 lb (86.637 kg)  BMI 25.90 kg/m2  PHYSICAL EXAM: Well nourished, well developed, in no acute distress HEENT: normal Neck: no JVD Cardiac:  normal S1, S2; RRR; no murmur Lungs:  clear to auscultation bilaterally, no wheezing, rhonchi or rales Abd: soft, nontender, no hepatomegaly Ext: no edema Skin: warm and dry Neuro:  CNs 2-12 intact, no focal abnormalities noted  EKG:   Sinus bradycardia, heart rate 49, left axis deviation, T wave inversions in leads 2, 3, aVF and V4-V6, LVH  ASSESSMENT AND PLAN:

## 2011-05-03 NOTE — Patient Instructions (Addendum)
Your physician recommends that you return for lab work in: TODAY DIGOXIN LEVEL AND BMET 427.89, 401.1, 414.04  Your physician recommends that you schedule a follow-up appointment in: 6 WEEKS WITH DR. Gala Romney AS PER SCOTT WEAVER, PA-C  Your physician recommends that you return for lab work in: 6 WEEKS HAVE FASTING LIVER/LIPID PANEL DONE ON THE SAME DAY YOU SEE DR. Gala Romney AS PER SCOTT WEAVER, PA-C.   2 WEEKS NEEDS TO HAVE NURSE VISIT TO HAVE AN EKG DONE AS PER SCOTT WEAVER, PA-C  Your physician has recommended you make the following change in your medication: DECREASE DIGOXIN 0.25 MG TO TAKING ONLY 1/2 (HALF) TABLET ONCE DAILY AS PER SCOTT WEAVER, PA-C.

## 2011-05-03 NOTE — Assessment & Plan Note (Signed)
BP better.  Not quite to goal yet.  Monitor for now.

## 2011-05-03 NOTE — Assessment & Plan Note (Signed)
Has follow up with Dr. Silvano Rusk.

## 2011-05-03 NOTE — Assessment & Plan Note (Signed)
No further chest pain.  D/w Dr. Gala Romney.  No need to proceed with myoview.  CP was felt to be non cardiac.  Reassured patient he did not have a heart attack.  Continue ASA.  Follow up in 6 weeks.

## 2011-05-04 LAB — DIGOXIN LEVEL: Digoxin Level: 1.1 ng/mL (ref 0.8–2.0)

## 2011-05-09 ENCOUNTER — Encounter: Payer: Self-pay | Admitting: Sports Medicine

## 2011-05-09 ENCOUNTER — Telehealth: Payer: Self-pay | Admitting: Internal Medicine

## 2011-05-09 NOTE — Assessment & Plan Note (Signed)
[facility]                            CARDIOLOGY OFFICE NOTE   COVEY, BALLER                     MRN:          [id number]  DATE:06/02/2008                            DOB:          [birth year]    PRIMARY CARE PHYSICIAN:  Dr. Leslee Home.   INTERVAL HISTORY:  Chad [name] is a 50 year old male with history of coronary  artery disease status post previous myocardial infarctions and bypass  surgery in 2006.  He also has congestive heart failure secondary  ischemic cardiomyopathy.  Previous ejection fraction was 40%.  However,  echocardiogram in January this year showed decline in EF to 25-30% in  the setting of poorly controlled hypertension.  He has a single chamber  ICD in place remainder of medical history is notable for hyperlipidemia,  mild renal insufficiency and medical noncompliance.   He returns today for routine follow-up.  He says he is feeling quite  good.  He denies any chest pain or shortness of breath.  His  defibrillator has not fired.  He has not had any palpitations.  His  blood pressure has been elevated.  He says he has been totally compliant  with his blood pressure medications.  He denies any lower extremity  edema, orthopnea.   CURRENT MEDICATIONS:  1. Lisinopril 20/12.5.  He states that he is taking two in the morning      and one at night.  2. Digoxin 0.25 a day.  3. Aldactone 25 a day.  4. Aspirin 325 a day.  5. Coreg 12.5 b.i.d.  6. Pravachol 40.  7. He stopped his Norvasc because he said it makes his finger joints      hurt to the point where he could not even write.  8. Viagra p.r.n.   PHYSICAL EXAMINATION:  GENERAL:  He is in no acute distress, ambulates  around the clinic without any respiratory difficulty.  VITAL SIGNS:  Blood pressure is 162/90, heart rate 62, weight is 211.  HEENT:  Normal.  NECK:  Supple.  No JVD.  Carotid 2+ bilaterally without bruits.  There  is no lymphadenopathy or thyromegaly.  CARDIAC:  PMI is laterally displaced.  He is regular with S4, no  murmurs.  LUNGS:  Clear.  ABDOMEN:  Soft, nontender, nondistended hepatosplenomegaly, no bruits,  no masses.  Good bowel sounds.  EXTREMITIES:  Warm with no cyanosis, clubbing or edema.  No rash.  NEUROLOGICAL:  Alert and oriented x3.  Cranial nerves II-XII are grossly  intact.  Moves all four extremities without difficulty.  Affect is  pleasant.   STUDIES:  EKG shows sinus rhythm rate of 62.  There is LVH with  repolarization abnormalities.  He has left axis deviation.  He has  prominent ST-T wave abnormalities laterally which are unchanged from  previous.   ASSESSMENT/PLAN:  1. Coronary artery disease status post bypass surgery.  This is      stable.  No evidence of ischemia.  Continue current therapy.  2. Hypertension.  This is suboptimally controlled.  We are, in a way,      running out of options.  I do not favor clonidine with LV      dysfunction, and he is not interested in Norvasc.  At this point,      we will go ahead and start hydralazine at 12.5 t.i.d. with the hope      to titrate it up to 25 t.i.d. in the near future.  He is not a      candidate for nitrates as he is also taking Viagra p.r.n.  3. Congestive heart failure.  He is NYHA class II.  We will recheck      his echocardiogram to make sure his ejection fraction does not      continue to get worse.  I am hesitant to titrate his Coreg any      further as I think this will induce pacing and he only has a single      chamber device.  And thus, RV pacing would likely be suboptimal for      him.  4. Hyperlipidemia.  Continue current medicines will check a repeat      lipid panel on his next visit.     Bevelyn Buckles. Bensimhon, MD  Electronically Signed    DRB/MedQ  DD: 06/02/2008  DT: 06/02/2008  Job #: [id number]

## 2011-05-09 NOTE — Consult Note (Signed)
NAME:  Chad [name], Chad [name] NO.:  [id number]   MEDICAL RECORD NO.:  [id number]          PATIENT TYPE:  INP   LOCATION:  2914                         FACILITY:  [facility]   PHYSICIAN:  Kerin Perna, M.D.  DATE OF BIRTH:  1961-05-05   DATE OF CONSULTATION:  06/21/2009  DATE OF DISCHARGE:                                 CONSULTATION   REASON FOR CONSULTATION:  Type A aortic dissection.   CHIEF COMPLAINT:  Intrascapular back pain.   PRIMARY CARDIOLOGIST:  Dr. Gala Romney.   HISTORY OF PRESENT ILLNESS:  I was asked to evaluate this 50 year old  African American hypertensive male for treatment of a recently diagnosed  type A aortic dissection.  The patient is status post multivessel  coronary bypass grafting by Dr. Evelene Croon in 2004, with left IMA to  the LAD, saphenous vein graft to the diagonal, sequential saphenous vein  graft to the OM1 and OM2, and sequential saphenous vein graft to RV  marginal and posterior descending.  He has nonischemic cardiomyopathy  and had an AICD placed for EF of 30%.  His most recent cath was in 2008,  which showed patent grafts and an EF of 40%.  He was admitted through  the emergency room early this morning when his AICD discharged and he  fell.  He subsequently developed severe back and interscapular chest  pain, diaphoresis and weakness and overall malaise.  He was evaluated in  the emergency department where his chest x-ray was unremarkable.  His  most recent x-ray showed lumbar disease at L4-L5 with subluxation.  Because of his persistent intrascapular pain, he underwent a CT  angiogram which did show a type A dissection rising at the aortic root  and extending down to the iliacs.  He subsequently underwent cardiac  catheterization by Dr. Gala Romney today which demonstrated compromise of  the left main by the false lumen, compromise of the right coronary  ostium, patent vein graft to the diagonal, patent vein graft to the  circumflex,  and a patent vein graft to the distal RV marginal and  posterior descending.  Subsequent interrogation of his ICD demonstrated  that he did not receive an ICD shock and the pain and the shock  sensation was probably the onset of the dissection.   PAST MEDICAL HISTORY:  1. Hypertension.  2. Hyperlipidemia.  3. Coronary artery disease.  4. Ischemic cardiomyopathy with an EF of 35-40%.   ALLERGIES:  1. SULFA  2. LATEX.  3. IODINE.   HOME MEDICATIONS:  1. Aldactone 25 mg daily.  2. Lisinopril 40 mg daily.  3. Hydrochlorothiazide 25 mg daily.  4. Zocor 40 mg daily.  5. Digoxin 0.25 mg daily.  6. Hydralazine 50 mg t.i.d.  7. Hydrocodone p.r.n.  8. Aspirin 1 daily.  9. Coreg 6.25 mg b.i.d.  10.Flexeril.   SOCIAL HISTORY:  He lives in [place] with his daughter and is  unemployed and does not smoke or use drugs.   FAMILY HISTORY:  No family history of aortic dissection or aneurysms.  Positive family history for coronary disease and myocardial infarction  and his father died  at a young age of an MI.   REVIEW OF SYSTEMS:  No recent weight loss or fever.  No dental  complaints or difficulty swallowing.  No history of significant thoracic  trauma.  No history of GI bleeding.  Positive history of gallbladder  sludge by prior ultrasound.  His baseline creatinine is 1.7.  The CT  angiogram showed some compromise of flow to the left kidney and  diminished flow in the left iliac system.  He denies any abdominal pain  or lower extremity motor weakness.   PHYSICAL EXAMINATION:  VITAL SIGNS:  He is 6 feet 1 inch and weighs 205  pounds.  Blood pressure is 180/100, pulse is 60 and regular, saturation  100% on 2 liters.  GENERAL:  He is a middle-aged [place] American male in the cath lab,  anxious, somewhat sedated but stable.  HEENT:  Normocephalic.  Pupils are equal and reactive.  Dentition good.  NECK:  Without JVD or mass.  Pulses are present in both carotids and  both upper  extremities.  There is no palpable adenopathy in the neck.  CHEST:  Breath sounds are clear.  He has a well-healed sternal incision.  CARDIAC:  Regular rhythm without murmur or gallop.  ABDOMEN:  Soft and nontender without pulsatile mass.  EXTREMITIES:  Reveal no clubbing or edema.  There is a well healed  endovein harvest scar on his right leg.  Peripheral pulses are 2+ in the  femoral, trace in the left foot, 1+ in the right foot.  Nonfocal  neurologic exam.   LABORATORY DATA:  I reviewed the coronary arteriograms with Dr.  Gala Romney and the CT scan.  He has a type A dissection with some  compromise of his native coronary circulation, though with patent vein  grafts and a probable patent left IMA from his most recent cath in 2008.   PLAN:  The patient will proceed with emergency repair of the dissection  with aortic replacement and revision of his vein grafts using  hypothermic circulatory arrest.  This procedure was discussed with the  patient, as well as his partner, Rene Kocher.  He understands the risks and  agrees to proceed with this emergency surgery.      Kerin Perna, M.D.  Electronically Signed     PV/MEDQ  D:  06/21/2009  T:  06/21/2009  Job:  [id number]

## 2011-05-09 NOTE — Assessment & Plan Note (Signed)
[facility]                                 ON-CALL NOTE   Chad [name], Chad [name]                     MRN:          [id number]  DATE:06/30/2008                            DOB:          10-30-1961    PRIMARY CARDIOLOGIST:  Bevelyn Buckles. Bensimhon, MD   [name] contacted me.  He stated he was sitting in the emergency  room and had been there over an hour and had not been seen by a  physician.  He had been assessed by the nursing staff and had an EKG  done.  He was not in a room.  He was in triage.  [name] was very  upset by this.  He stated that he was having active chest pain.   I contacted the nursing staff and advised them that [name] was  complaining of chest pain while in the emergency room and requested that  they find him a bed as soon as possible so we could assess him.  I then  recontacted [name] to let him know that I had spoken with the  nursing staff and he would be seen shortly.      Theodore Demark, PA-C  Electronically Signed      Noralyn Pick. Eden Emms, MD, [facility]  Electronically Signed   RB/MedQ  DD: 07/01/2008  DT: 07/02/2008  Job #: [id number]

## 2011-05-09 NOTE — Op Note (Signed)
NAME:  Chad [name], Chad [name] NO.:  [id number]   MEDICAL RECORD NO.:  [id number]          PATIENT TYPE:  INP   LOCATION:  5529                         FACILITY:  [facility]   PHYSICIAN:  Kathaleen Maser. Pool, M.D.    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   PROCEDURE NAME:  Right L5-S1 laminotomy, microdiskectomy.   SURGEON:  Kathaleen Maser. Pool, MD   ASSISTANT:  Reinaldo Meeker, MD   ANESTHESIA:  General endotracheal.   INDICATIONS:  Chad [name] is a 50 year old male with a history of  previous right-sided L4-5 laminotomy and diskectomy.  The patient  presents now with worsening back and right lower extremity pain,  paresthesias, and weakness consistent primarily with an S1 radiculopathy  with some elements of an L4 radiculopathy as well.  Workup demonstrates  evidence of marked disk space collapse and spondylosis at L4-5 and no  areas of significant disk herniation or marked nerve root compression.  At L5-S1, the patient has a large rightward paracentral disk herniation,  decompression of thecal sac and right-sided S1 nerve root.  We discussed  options for management including possibility of moving forward with a  right-sided L5-S1 laminotomy and microdiskectomy.  I discussed the risks  and benefits of the surgery including damage to neurovascular  structures, bleeding, infection, CSF leak, nerve root injury, disk  herniation, later he can even not benefit, the patient answered  questions, he understands, and he wished to proceed with surgery.   OPERATIVE NOTE:  The patient was taken to the operating room table and  placed in a supine position.  After adequate level of anesthesia was  achieved, the patient was placed  prone onto Wilson frame, appropriately  padded the patient's lumbar region and prepped and draped sterilely.  A  #10 blade was used to make a curvilinear skin incision overlying the L5-  S1 interspace.  This was carried down sharply in the midline.  A  subperiosteal dissection was then performed exposing the lamina and  facet joints of L5 and S1.  Deep self-retaining retractors were placed.  Intraoperative x-rays were taken and level was confirmed.  Laminotomy  was then performed using high speed drill and Kerrison rongeurs in the  inferior aspect of the lamina of L5, medial aspect of L5-S1 facet  joints, superior rim of the L5 lamina.  Ligament flavum was then  elevated and resected in a piecemeal fashion using Kerrison rongeurs on  the thecal sac and exiting S1 nerve root were identified.  Microscope  was then brought into the field.  Using microdissection, the right-sided  S1 nerve root and underlying disk herniation and epidural venous plexus  coagulated and cut.  Thecal sac and S1 nerve root gently mobilized,  retracted towards the midline.  Disk space was identified as was a large  disk herniation.  This was then incised with a #15 blade  in a  rectangular fashion.  A wide disk space clean-out was then achieved  using pituitary rongeurs, upbitting pituitary rongeurs,and Epstein  curettes.  All elements of the disk herniation were completely resected.  All loose or obviously degenerative disk material was then removed from  the interspace.  At this point, a very thorough diskectomy was achieved.  There was no injury to thecal sac or nerve roots.  Wound was then  irrigated with antibiotic solution.  Gelfoam was placed topically for  hemostasis and found to be good.  Microscope and retractors were  removed.  Hemostasis was then achieved with  electrocautery.  The wounds were then closed in layers with Vicryl  suture.  Steri-Strips and sterile dressings were applied.  There were no   complications.  The patient tolerated the procedure well, and he  returned to the recovery room postoperatively.           ______________________________  Kathaleen Maser Pool, M.D.     HAP/MEDQ  D:  07/23/2009  T:  07/24/2009  Job:  [id number]
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
## 2011-05-09 NOTE — Assessment & Plan Note (Signed)
[facility]                            CARDIOLOGY OFFICE NOTE   [name], [name]                     MRN:          [id number]  DATE:09/24/2007                            DOB:          1961-03-05    INTERVAL HISTORY:  [name] is a very pleasant 50 year old male  with a history of coronary artery disease status post bypass grafting in  July, 2006 with an ischemic cardiomyopathy, most recent except for is 40-  45%.  He also has a history of hypertension, hyperlipidemia, CONTRAST  DYE ALLERGY and noncompliance.  He presents today for routine followup.  He has missed several visits.   Overall he is doing fairly well. Denies any significant chest pain or  shortness of breath, every once in a while he does get a stabbing pain  but these are not sustained.  He feels like his functional capacity is  very good.  Unfortunately he has run out of his medications and has not  been taking any for several weeks.   CURRENT MEDICATIONS:  1. Lisinopril & HCTZ 40/25.  2. Toprol XL 150 mg daily.  3. Digoxin 0.25 daily.  4. Aldactone 25 daily.  5. Aspirin 325 daily.  (All of which he is out of).   MEDICATION INTOLERANCES:  He has previously been intolerant of Statin's  due to myalgias and refuses to take them or NORVASC.   PHYSICAL EXAMINATION:  He is well-appearing, ambulates around the clinic  without any respiratory difficulty.  VITAL SIGNS:  Blood pressure is 150/96, heart rate 77, weight is 222.  HEENT:  Normal.  NECK:  Supple, there is no JVD.  Carotids are 2+ bilaterally without any  bruits, there is no lymphadenopathy or thyromegaly.  CARDIAC:  PMI is nondisplaced, he has a regular rate and rhythm with an  S4, no murmur.  LUNGS:  Clear.  ABDOMEN:  Soft, nontender, nondistended, there is no hepatosplenomegaly,  no bruits, no masses, good bowel sounds.  EXTREMITIES:  Warm with no cyanosis, clubbing or edema and no rash.  NEURO:  Alert and  oriented x3.  Cranial nerves II-XII are intact.  Moves  all four extremities without any difficulty.  Affect is very pleasant.   EKG shows normal sinus rhythm at a rate of 77, there is LVH with diffuse  T wave inversions inferolaterally which are chronic.   ASSESSMENT AND PLAN:  1. Coronary artery disease status post previous infarct and bypass      surgery.  He is doing well without any evidence of ischemia.  2. Hypertension.  Blood pressure is moderately elevated in the setting      of medication noncompliance.  We have restarted his medications, we      are actually switching him over from Toprol to Coreg at 12.5 b.i.d.  3. Congestive heart failure. This is stable.  NYHA Class I.  As above,      we are restarting his medications and switching his beta blocker      over to Coreg.  4. Hyperlipidemia.  He is off all his medications.  I discussed the  importance of the Statin with him and we will try Pravachol at 20      mg daily and see how he tolerates.  He will need a lipid panel on      his next visit.     Bevelyn Buckles. Bensimhon, MD  Electronically Signed    DRB/MedQ  DD: 09/24/2007  DT: 09/24/2007  Job #: [id number]

## 2011-05-09 NOTE — Discharge Summary (Signed)
NAME:  Chad [name], Chad [name] NO.:  [id number]   MEDICAL RECORD NO.:  [id number]           PATIENT TYPE:   LOCATION:                                 FACILITY:   PHYSICIAN:  Theodosia Paling, MD    DATE OF BIRTH:  02-Oct-1961   DATE OF ADMISSION:  06/01/2009  DATE OF DISCHARGE:  06/03/2009                               DISCHARGE SUMMARY   PRIMARY CARE PHYSICIAN:  [facility].   CARDIOLOGIST:  Bevelyn Buckles. Bensimhon, MD   ADMITTING HISTORY:  Please refer to the excellent admission note  dictated by Dr. Abram Sander under history of present illness.   DISCHARGE DIAGNOSIS:  Back pain secondary to degenerative joint disease.   SECONDARY DIAGNOSES:  1. History of ischemic cardiomyopathy status post coronary artery      bypass graft and automatic implantable cardioverter-defibrillator      placement with ejection fraction of 25-30%, remained compensated      through the stay.  2. Hypertension.  3. Dyslipidemia.   DISCHARGE MEDICATIONS:  The patient is to continue home medication which  includes,  1. Aldactone 25 mg p.o. daily.  2. Lisinopril 40 mg p.o. daily.  3. Pravastatin 40 mg p.o. daily.  4. Digoxin 0.25 mg p.o. daily.  5. Hydralazine 50 mg p.o. q.8 h.  6. Aspirin 325 mg p.o. daily.  7. Ibuprofen 200 mg p.o. q.8 h., p.r.n.  8. Coreg 6.25 mg p.o. q.12 h.  9. Flexeril 10 mg p.o. daily.   New medication added,  1. Dilaudid 2.5 mg p.o. q.4 h., p.r.n.  2. OxyContin 30 mg p.o. q.12 h., p.r.n.  3. Prilosec 20 mg p.o. q.12 h.   Medication adjusted, hydrochlorothiazide increased to 50 mg p.o. daily.   Medications stopped, Percocet.   HOSPITAL COURSE:  Following issues were addressed during the  hospitalization.  1. Back pain.  The patient in past has been diagnosed with      degenerative joint disease and especially in the lumbar region      causing severe debilitating pain.  The patient will follow up with      Dr. Dutch Quint.  This pain is currently controlled  on Dilaudid and he is      requesting same for home.  He is discharged home on Dilaudid and      p.r.n. OxyContin.  He had no neurological deficits associated with      the pain or evidence of cauda equina syndrome.  Dr.  Dutch Quint has been      personally contacted by me who will be evaluating him as an      outpatient for possible surgery.  2. Systolic heart failure remained compensated.  3. Hypertension.  His diastolic blood pressure is running high;      therefore, hydrochlorothiazide is increased to 50 mg p.o. daily.  4. Dyslipidemia.  Statins are continued.   PROCEDURE PERFORMED:  None.   CONSULTATION PERFORMED:  None.   IMAGING PERFORMED:  None.   DISPOSITION:  1. The patient will follow up with [facility] for      hypertension management and follow up on  BMP.  2. The patient will follow up with Dr. Dutch Quint in one week time for      further evaluation and management of his back pain.   Total time spent in discharge of this patient around 40 minutes.      Theodosia Paling, MD  Electronically Signed     NP/MEDQ  D:  06/03/2009  T:  06/04/2009  Job:  [id number]   cc:   Adele Schilder  [facility]

## 2011-05-09 NOTE — Discharge Summary (Signed)
NAME:  REI, MEDLEN NO.:  [id number]   MEDICAL RECORD NO.:  [id number]          PATIENT TYPE:  INP   LOCATION:  3713                         FACILITY:  [facility]   PHYSICIAN:  Pedro Earls, MD     DATE OF BIRTH:  1961-06-23   DATE OF ADMISSION:  05/03/2009  DATE OF DISCHARGE:  05/06/2009                               DISCHARGE SUMMARY   DISCHARGE DIAGNOSES:  Back pain and radiculopathy.   HOSPITAL COURSE:  This is a 50 year old African-American male patient  with a history of ischemic cardiomyopathy status post ICD placement who  was admitted with a low back pain.  The patient was found to be  intractable low back pain with degenerative changes in the L4-L5, L5-S1.  The patient was admitted to the medical floor.  IV steroids initially  was started, then changed to prednisone.  I have discussed this case  with Dr. Julio Sicks, whose recommendation were to keep the pain  management on board and steroids and the patient can follow up in his  office in 1-2 weeks.  The patient's symptoms have improved.  The patient  received some prednisone which will be stopped.  The patient does not  need a tapering steroid at this point since the patient was given short-  term steroids.   DISCHARGE MEDICATIONS:  1. Aldactone 25 mg p.o. daily.  2. Lisinopril/hydrochlorothiazide 40/25 mg daily.  3. Pravastatin 40 mg p.o. daily.  4. Digoxin 0.25 mg p.o. daily.  5. Hydralazine 25 mg p.o. t.i.d.  6. Hydrocodone/APAP 5/325 mg p.o. q.4 h. p.r.n. pain.  7. Aspirin 325 mg p.o. daily.  8. Ibuprofen 200 mg p.r.n.  9. Coreg 12.5 mg one-half tablet b.i.d.   The patient was given a prescription for Vicodin.  Follow up with Dr.  Julio Sicks in 1-2 weeks.  The patient will make an appointment, follow  up with the primary care physician in 1-2 weeks.      Pedro Earls, MD  Electronically Signed     NS/MEDQ  D:  05/06/2009  T:  05/06/2009  Job:  [id number]

## 2011-05-09 NOTE — Discharge Summary (Signed)
NAME:  Chad [name], RISE NO.:  [id number]   MEDICAL RECORD NO.:  [id number]          PATIENT TYPE:  INP   LOCATION:  6524                         FACILITY:  [facility]   PHYSICIAN:  Bevelyn Buckles. Bensimhon, MDDATE OF BIRTH:  [birth year]   DATE OF ADMISSION:  10/22/2007  DATE OF DISCHARGE:  10/23/2007                         DISCHARGE SUMMARY - REFERRING   DISCHARGE DIAGNOSES:  1. Upper respiratory infection.  2. Pleuritic chest discomfort.  3. Hypertension.  4. Noncompliance.  5. History of the ischemic cardiomyopathy with coronary artery disease      status post bypass grafting history as noted below.   SUMMARY OF HISTORY:  [name] is a 50 year old male with known  history of hypertension, hyperlipidemia and coronary artery disease  status post bypass grafting with cardiomyopathy presents with upper  respiratory symptoms consisting of fever, congestion, productive cough.  He has also noticed some left-sided chest discomfort post coughing.  Dr.  Jens Som evaluated him and admitted him for further observation.  Dr.  Jens Som noted that his recent catheterization May, 2008 showed patent  grafts.  He was prescribed Zithromax, Mucinex and Xopenex for his upper  respiratory symptoms.  The patient also agreed to try Pravachol for his  hyperlipidemia 20 mg p.o. q.h.s. during this admission.   LABORATORY:  Admission H and H was 15.3 and 46, normal indices,  platelets 147, WBCs 4.2.  Sodium 139, potassium 3.3, BUN 10, creatinine  1.08, glucose 133, normal LFTs except indirect bilirubin was slightly  elevated at 1.  CK-MB relative index and troponins were within normal  limits x2.  BNP was 242.  Chest x-ray on the twenty-eighth showed mild  cardiomegaly with a mild increase in size, otherwise unremarkable.  EKG  showed normal sinus rhythm, baseline artifact, delayed R-wave, LVH,  nonspecific changes.   HOSPITAL COURSE:  Mr. Grimaldo was admitted by Dr. Jens Som to 6500.  Overnight he did not have any further chest discomfort, but he did  complain of mild congestion, but felt much better.  Nursing noted that  the patient was admitted to not taking his medications all the time.  By  October 23, 2007 Dr. Gala Romney after review felt that the patient could  be discharged home.   DISPOSITION:  The patient is discharged home.   He received new prescriptions for:  1. Zithromax 250 mg daily for 4 days.  2. Mucinex 60 mg b.i.d. for 7 days.  3. Xopenex daily as needed.  4. He also received a prescription for Pravachol 20 mg q.h.s.  He will      need blood work in 6-8 weeks for FLP and LFTs since Pravachol was      initiated.  5. He was asked to continue his Klor-Con 20 mg 2 tablets daily.  6. Spironolactone 25 mg daily.  7. Furosemide 90 mg daily.  8. Lisinopril/hydrochlorothiazide 20/12.5 mg 2 tablets daily.  9. Digitek 0.25 daily.  10.Aspirin 81 daily.  11.Nitroglycerin 0.4 as needed.   He is asked to bring all medications to all appointments, to arrange a 1-  2 weeks appointment with Dr. Lorenz Coaster and he will keep his usual  scheduled  appointment with Dr. Gala Romney on January 14, 2008 at 0845.   DISCHARGE TIME:  25 minutes.      Joellyn Rued, PA-C      Bevelyn Buckles. Bensimhon, MD  Electronically Signed    EW/MEDQ  D:  10/23/2007  T:  10/23/2007  Job:  [id number]   cc:   Reuben Likes, M.D.

## 2011-05-09 NOTE — H&P (Signed)
NAME:  Chad [name], SEMEL NO.:  [id number]   MEDICAL RECORD NO.:  [id number]          PATIENT TYPE:  INP   LOCATION:  2003                         FACILITY:  [facility]   PHYSICIAN:  Reginia Forts, MD     DATE OF BIRTH:  24-Oct-1961   DATE OF ADMISSION:  02/14/2008  DATE OF DISCHARGE:                              HISTORY & PHYSICAL   PRIMARY CARE PHYSICIAN:  Leslee Home, M.D.   CHIEF COMPLAINT:  Chest pain.   HISTORY OF PRESENT ILLNESS:  Chad [name] is a 50 year old African-  American male with a history of ischemic cardiomyopathy status post  coronary artery bypass and ICD placement, who presents with 36 hours of  left chest pain. The patient states that symptoms are similar in quality  but less in intensity, compared to a prior MI. This chest pain is  described as sharp and constant with a pleuritic component and an  underlying pressure sensation. There is mild neck pain and left arm  pain. The patient denies any tearing sensation in his back. He does note  some mild shortness of breath but denies any orthopnea or paroxysmal  nocturnal dyspnea or syncope. Symptoms do occur at rest and are not  associated with exertion. Due to his concern that the symptoms are  related to a heart attack, he presented to the emergency room for  further evaluation. He does state that he has not yet filled his  hydralazine prescription, which was provided a few weeks ago in  Cardiology Clinic. His last echocardiogram in January of 2009  demonstrated an ejection fraction of 25% to 30% with mild mitral  regurgitation.   PAST MEDICAL HISTORY:  1. Non-ST elevation myocardial infarction in 2006 with a subsequent      bypass surgery.  2. Bypass surgery x7 vessels in 2006 with an SVG to OM1 and OM2, LIMA      to LAD, SVG to the diagonal, SVG to the distal RCA, posterior      descending and posterolateral branches.  3. Status post cardiac catheterization in May 2008 with an LAD 60%  stenosis. Circumflex patent but occluded OM, 80% posterolateral      branch, and 90% distal RCA. All grafts were patent. Distal RCA      disease was treated medically.  4. Ischemic cardiomyopathy with an EF of 25% to 30%.  5. Hypertension.  6. Hyperlipidemia.  7. Status post Guidant ICD.  8. History of pleuritic chest pain.  9. Remote history of asthma.  10.History of chronic kidney disease. Creatinine is 1.5 today.  11.Remote history of pancreatitis.   ALLERGIES:  IODINE, SULFA, SHELLFISH, NORVASC.   MEDICATIONS:  1. Lisinopril/HCTZ 20/12.5 mg daily.  2. Digoxin 0.25 mg daily.  3. Aldactone 25 mg p.o. daily.  4. Aspirin 325 mg p.o. daily.  5. Coreg 12.5 mg p.o. b.i.d.  6. Pravachol 20 mg p.o. daily.  7. Potassium 20 meq p.o. 2 tablets daily.  8. Lasix 80 mg p.o. daily.  9. Hydralazine 25 mg p.o. t.i.d. (the patient has not yet started this      medication.)   SOCIAL HISTORY:  The patient lives in [place] with 5 of his 8  children. He is self-employed. He denies any history of alcohol,  tobacco, or drug use.   FAMILY HISTORY:  Notable for mother who is alive but with coronary  disease. Father died at age 19 with history of coronary artery disease.  Siblings have also had myocardial infarctions in their 30's.   REVIEW OF SYSTEMS:  Notable for chronic episodes of pre-syncope,  arthralgias in the neck and right shoulder, and chest pain as noted. The  rest of the 12 systems were reviewed  and are negative.   PHYSICAL EXAMINATION:  VITAL SIGNS:  Temperature 97.1, pulse 68,  respiratory rate 16 to 18, blood pressure 157/101.  GENERAL:  Awake, alert, and oriented x3. No acute distress.  HEENT:  Normocephalic and atraumatic. Pupils are equal, round, and  reactive to light. Extraocular muscles intact.  NECK:  No jugular venous distention. No carotid bruits.  CARDIOVASCULAR:  Regular rate and rhythm. Normal rate. No murmur, rub,  or gallop.  LUNGS:  Clear to auscultation  bilaterally.  ABDOMEN:  Positive bowel sounds. Soft, nontender, and nondistended.  EXTREMITIES:  No clubbing, cyanosis, or edema with 2+ femoral pulses, 1+  distal pulses.  MUSCULOSKELETAL:  No joint effusions or tenderness.  NEUROLOGIC:  Cranial nerves 2-12 are grossly intact. No focal  musculoskeletal or sensory deficits.  SKIN:  No injuries, no rash, no lesions.  LYMPH:  Demonstrates no lymphadenopathy.   LABORATORY DATA:  Chest x-ray demonstrates no acute cardiopulmonary  disease.   EKG demonstrates sinus bradycardia with a rate of 51, left ventricular  hypertrophy with re-polarization abnormality.   White count 3.9. Hemoglobin 1.49. Platelets 150,000. BUN 18, creatinine  1.5, troponin less than 0.05. CK is 53, MB is less than 1. Digoxin level  1.0.   ASSESSMENT/PLAN:  1. This is a 50 year old African-American male with ischemic      cardiomyopathy and ICD, who presents with chest pain, concerning      for acute coronary syndrome. The patient will be admitted to      telemetry bed and ruled out for myocardial infarction. Cardiac      catheterization from 2006 will likely be reviewed.  2. Hypertension. The patient will be continued on his home medications      and low dose hydralazine will be initiated.      Reginia Forts, MD  Electronically Signed     RA/MEDQ  D:  02/14/2008  T:  02/15/2008  Job:  [phone] [id number]

## 2011-05-09 NOTE — Discharge Summary (Signed)
NAME:  Chad [name], LENNARTZ NO.:  [id number]   MEDICAL RECORD NO.:  [id number]          PATIENT TYPE:  INP   LOCATION:  2023                         FACILITY:  [facility]   PHYSICIAN:  Noralyn Pick. Eden Emms, MD, FACCDATE OF BIRTH:  [birth year]   DATE OF ADMISSION:  07/01/2009  DATE OF DISCHARGE:  07/04/2009                               DISCHARGE SUMMARY   ADDENDUM   The patient was kept for overnight observation, given a CBC finding  revealing a persistently low hemoglobin level of around 9.  This  actually had dropped to 8.8, just prior to discharge, from a level of  9.0 earlier that morning.  His admission level was 10.1.  White count  and platelets were stable.  The patient was also hemodynamically stable,  and there was no evidence of any overt bleeding.  However, given his  recent surgery and persistent right-sided chest pain, the plan was to  keep him for overnight observation with followup blood work in the  morning.   A repeat CBC the following morning again showed a persistently low  level, although unchanged at 8.8.  Dr. Sherlie Ban then suggested one more  followup study, later that day, which showed continuous stabilization,  with slight elevation in the hemoglobin to 8.9.  He had advised that if  the hematocrit remains greater than 25, that the patient could then be  cleared for discharge.   Dr. Sherlie Ban also ordered a CT scan of the head, given the patient's  persistent complaint of headache.  This scan was negative, with no  evidence of hemorrhage.   Given these 2 followup results, the patient was finally cleared for  discharge.  No new medication adjustments were made, and he was to  resume his previous home medication regimen, as had been recommended the  day before.   The patient was once again advised to follow up with Dr. Kathlee Nations  Trigt, as had been previously scheduled following his recent  hospitalization.      Gene Serpe, PA-C      Peter C.  Eden Emms, MD, [facility]  Electronically Signed    GS/MEDQ  D:  07/04/2009  T:  07/05/2009  Job:  [id number]

## 2011-05-09 NOTE — Assessment & Plan Note (Signed)
[facility]                         ELECTROPHYSIOLOGY OFFICE NOTE   EPIC, TRIBBETT                     MRN:          [id number]  DATE:08/25/2008                            DOB:          Mar 02, 1961    [name] returns today for followup.  He is a very pleasant elderly  man with nonischemic cardiomyopathy and congestive heart failure and  hypertension who returns today for followup.  He denies chest pain.  He  denies shortness of breath.  His heart failure is presently class I-II.  His blood pressure is little elevated today, but he states that he did  not take his medicines this morning, but he plans to take it when he  goes home.   CURRENT MEDICINES:  1. Digoxin 0.25 a day.  2. Aldactone 25 mg daily.  3. Aspirin 325 a day.  4. Coreg 12.5 twice a day.  5. Pravachol 40 a day.  6. Lisinopril 20/12.5 twice daily.  7. Hydralazine 12.5 t.i.d.   PHYSICAL EXAMINATION:  GENERAL:  He is a pleasant well-appearing man in  no distress.  VITAL SIGNS:  Blood pressure was 149/97, the pulse 73 and regular,  respirations were 18, the weight was 217 pounds.  NECK:  No jugular venous distention.  LUNGS:  Clear bilaterally to auscultation.  No wheezes, rales, or  rhonchi are present.  CARDIOVASCULAR:  Regular rhythm.  Normal S1 and S2.  EXTREMITIES:  No edema.   Interrogation of his defibrillator demonstrates a Guidant vitality.  R-  waves were 12, the impedance 500, and a threshold 0.614.  Battery  voltage was 3 volts.   IMPRESSION:  1. Ischemic cardiomyopathy status post myocardial infarction severe      left ventricular dysfunction.  2. Congestive heart failure Class II.  3. Status post implantable cardioverter-defibrillator insertion.   DISCUSSION:  Mr. Wilbourne is stable.  Defibrillator is working  normally.  He had no intercurrent ICD therapies.  His heart failure is  well controlled.  We will plan to see him back in the office in 1  year.     Doylene Canning. Ladona Ridgel, MD  Electronically Signed    GWT/MedQ  DD: 08/25/2008  DT: 08/26/2008  Job #: [id number]   cc:   Reuben Likes, M.D.

## 2011-05-09 NOTE — Discharge Summary (Signed)
NAME:  Chad, [name] NO.:  [id number]   MEDICAL RECORD NO.:  [id number]          PATIENT TYPE:  INP   LOCATION:  3707                         FACILITY:  [facility]   PHYSICIAN:  Bevelyn Buckles. Bensimhon, MDDATE OF BIRTH:  [birth year]   DATE OF ADMISSION:  08/09/2007  DATE OF DISCHARGE:  08/10/2007                               DISCHARGE SUMMARY   DISCHARGE DIAGNOSES:  1. Chest pain, atypical, ruled out for myocardial infarction.  2. Headache, status post CT of the head showing no acute intracranial      abnormalities, stable chronic small vessel disease.  3. Hypertension.  4. History of congestive heart failure.   [name] is a 50 year old gentleman well known to Dr. Gala Romney who  presented to [facility] emergency room complaining of a headache and  chest discomfort.  The patient states he took a handful aspirin with  some relief but no complete resolution of headache.  He also continued  to complain of chest discomfort.  The patient was admitted for  observation.  He ruled out for a myocardial infarction.  CT of the head  was obtained secondary to ongoing headache.  Dr. Gala Romney in to see the  patient on day of discharge.  Patient stable.  Blood pressure still  elevated at 141/108.  Still with complaints of atypical chest  discomfort.  Otherwise lab work within normal limits.   DISCHARGE MEDICATIONS:  Patient being discharged home with instructions  to continue his previous medications including digoxin 0.25, Aldactone  25, Toprol- XL 150 mg daily, metoprolol, Norvasc 5, lisinopril and HCTZ  40/25 mg daily, Lipitor 40, Lasix 80 mg as needed, aspirin 81 mg daily.   The patient has a followup appointment with Dr. Gala Romney September 24, 2007 at 11:00 a.m.   DURATION OF DISCHARGE ENCOUNTER:  Less than 30 minutes.      Dorian Pod, ACNP      Bevelyn Buckles. Bensimhon, MD  Electronically Signed    MB/MEDQ  D:  08/10/2007  T:  08/11/2007  Job:  [id number]

## 2011-05-09 NOTE — Discharge Summary (Signed)
NAME:  Chad [name], Chad [name] NO.:  [id number]   MEDICAL RECORD NO.:  [id number]          PATIENT TYPE:  INP   LOCATION:  2015                         FACILITY:  [facility]   PHYSICIAN:  Kerin Perna, M.D.  DATE OF BIRTH:  11/03/1961   DATE OF ADMISSION:  06/21/2009  DATE OF DISCHARGE:  06/30/2009                               DISCHARGE SUMMARY   HISTORY:  The patient is a 50 year old black male with a history of  ischemic cardiomyopathy with ejection fraction of 35-40%, status post  coronary artery bypass grafting and ICD placement with a single ICD  shock complicated by a fall.  He states that he was in his usual state  of health, being active around the house, doing chores including washing  dishes, cooking dinner, washing cloths, when he stated that he arose  from a bent position and felt a lump in his throat.  At that time, he  received an ICD shock, which startled him.  He felt quite hard and  subsequently experienced anginal equivalent of chest pain, leg pain, and  profuse diaphoresis.  He stated that his symptoms were quite severe  compared to his usual angina symptoms.  He stated recently he had been  in his baseline status as far as chest pain with only occasional  episodes.  He did report some mild increased lower extremity edema in  the previous few days prior to admission.  He denied palpitation,  syncope, presyncope, paroxysmal nocturnal dyspnea, or orthopnea.  He  also noted that he has been out of work and not taking his medications  recently.  He was felt to require admission for further evaluation and  treatment.  He was felt to require evaluation of his ICD due to the  shock.  He was also required cardiac enzymes and reinterrogation of the  pacemaker.  Due to low back pain, he was also felt to require a  myelogram and this was ordered by the emergency physician.   PAST MEDICAL HISTORY:  Ischemic cardiomyopathy, status post [facility]  scientific ICD  placement.  Most recent EF in May 2010 was 35-40%.  Other  diagnoses include the following coronary artery bypass grafting in 2006.  History of non-ST elevation myocardial infarction in 2006, history of  hypertension, history of hyperlipidemia, history of low back and  radicular pain scheduled in future to see Dr. Jordan Likes for surgical  evaluation.   ALLERGIES:  SULFA, LATEX, and SHELLFISH.   MEDICATIONS PRIOR TO ADMISSION:  1. Aldactone 25 mg daily.  2. Lisinopril 40 mg daily.  3. Hydrochlorothiazide 25 mg daily.  4. Pravastatin 40 mg daily.  5. Digoxin 0.25 mg daily.  6. Hydralazine 50 mg t.i.d.  7. Hydrocodone p.r.n.  8. Aspirin 325 mg daily.  9. Coreg 6.25 mg twice daily.  10.Flexeril 10 mg daily.   FAMILY HISTORY, SOCIAL HISTORY, REVIEW OF SYMPTOMS, AND PHYSICAL  EXAMINATION:  Please see history and physical done at the time of  admission.   HOSPITAL COURSE:  The patient was admitted.  He was seen in Cardiology  evaluation by Dr. Gala Romney.  He was restarted on  his medication.  Enzymes were cycled.  He has a CK-MB of 0.8 and troponin I was 0.03.  His BNP was 112.  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  A Thoracic  Surgical consultation was obtained with Dr. Charlett Lango.  He was  felt to require cardiac catheterization to better delineate his anatomy  prior to proceeding with surgical repair of the type 1 aortic  dissection.  This was done by Dr. Gala Romney.  Findings were consistent  with apparent occlusion of left main possibly secondary to the aortic  dissection.  The saphenous vein grafts x3 were patent. The subclavian  was not injected.  He was taken to the operating room for repair on June 22, 2009, by Dr. Kathlee Nations Trigt.  He underwent a redo sternotomy with  emergency repair of aortic dissection using a 28-mm Hemashield  graft  with reimplantation of the vein grafts using hypothermic circulatory  arrest.  He tolerated the procedure well.  He was taken to the surgical  intensive care unit in stable condition.  Postoperative hospital course,  the patient has progressed nicely overtime.  He has required transfusion  for acute blood loss anemia.  His valves are felt to be stable.  His  most recent hemoglobin and hematocrit dated June 29, 2009, are 8.5 and  24.7 respectively.  He has had a postoperative thrombocytopenia also  which is improved overtime.  His most recent platelet count on June 29, 2009, is 207,000.  He had acute renal insufficiency on top of his  chronic renal insufficiency, but again his valves have stabilized  overtime.  His most recent BUN and creatinine dated June 30, 2009, are 15  and 1.61 respectively.  He has had some difficulty, postoperative  hypertension, but his medications have been adjusted and this has  stabilized overtime to near baseline levels.  We have not been able to  start ACE inhibitor due to his renal insufficiency.  Additionally during  the postoperative period, he has had some difficulty with gout.  He was  seen in the Orthopedic consultation by Dr. Dion Saucier.  He underwent  injection with Celestone and Xylocaine with improvement.  Additionally,  he was given a course of colchicine as well.  He has been started on  allopurinol.  The symptoms of gout have improved.  Additionally during  the postoperative period, he had some nonsustained ventricular  tachycardia.  His device has been rechecked by the [employer].  The ICD is felt to be functioning normally.  The patient's  incisions are all healing well without evidence of infection.  He is  tolerating routine activities using standard protocols.  He does have  some difficulty with his low back pain, which is chronic and requires  for further evaluation and intervention in the future after recovering  from  surgery.  His oxygen has been weaned and he maintains good  saturations on room air.  His overall status is felt to be stable for  discharge on June 30, 2009.   CONDITION ON DISCHARGE:  Stable and improving.   MEDICATIONS ON DISCHARGE:  1. Aldactone 25 mg daily.  2. Hydralazine 50 mg t.i.d.  3. Aspirin 325 mg daily.  4. Flexeril 10 mg daily p.r.n.  5. Digoxin 0.125 mg every other day.  6. Coreg 25 mg twice daily.  7.  Zocor 40 mg daily.  8. Neurontin 300 mg twice daily.  9. Allopurinol 200 mg daily.  10.Colchicine 0.6 mg daily for one additional week for pain.  11.Oxycodone 1 or 2 every 4-6 hours as needed for pain.   FINAL DIAGNOSIS:  Type 1 aortic dissection, now status post surgical  repair as described.   OTHER DIAGNOSES:  1. Postoperative acute blood loss anemia.  2. Postoperative thrombocytopenia.  3. Postoperative acute renal insufficiency.  4. Chronic renal insufficiency.  5. Hypertension.  6. Hyperlipidemia.  7. Coronary artery disease.  8. Ischemic cardiomyopathy.  9. Allergies to LATEX, SULFA, and IODINE.  10.History of previous non-ST segment elevation myocardial infarction      in 2006.  11.Low back pain and radicular symptoms for scheduled further      evaluation in the future with possible surgical intervention by Dr.      Jordan Likes per the patient.  12.Postoperative gout.   INSTRUCTIONS:  The patient will receive written instructions in regard  to medications, activity, diet, wound care, and followup.   FOLLOWUP:  Followup include Dr. Donata Clay on July 16, 2009.  Additionally, he is instructed to follow up with Dr. Gala Romney in 2  weeks post discharge.      Rowe Clack, P.A.-C.      Kerin Perna, M.D.  Electronically Signed    WEG/MEDQ  D:  06/30/2009  T:  06/30/2009  Job:  [id number]   cc:   Bevelyn Buckles. Bensimhon, MD  Reuben Likes, M.D.  Kerin Perna, M.D.  Eulas Post, MD  Kathaleen Maser. Pool, M.D.
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
## 2011-05-09 NOTE — Cardiovascular Report (Signed)
NAME:  Chad [name], SPERANZA NO.:  [id number]   MEDICAL RECORD NO.:  [id number]          PATIENT TYPE:  INP   LOCATION:  2316                         FACILITY:  [facility]   PHYSICIAN:  Bevelyn Buckles. Bensimhon, MDDATE OF BIRTH:  23-Sep-1961   DATE OF PROCEDURE:  06/21/2009  DATE OF DISCHARGE:                            CARDIAC CATHETERIZATION   IDENTIFICATION:  Mr. Greenley is a 50 year old male with known coronary  artery disease status post previous bypass surgery.  He also has an  ischemic cardiomyopathy.  He was admitted with intractable chest pain.  He was found to have an acute type 1 aortic dissection involving great  vessels of the head and neck.  He was evaluated by Dr. Charlett Lango, and diagnostic cath preoperatively was advised to evaluate  coronary arteries and grafts to help guide surgery.  This was obviously  a high-risk procedure given his dissection.  I discussed this with Mr.  Scheff and he agreed to proceed.   PROCEDURES PERFORMED:  1. Selective coronary angiography.  2. Saphenous vein graft angio.   DESCRIPTION OF PROCEDURE:  The risks and indication of catheterization  were explained.  Consent was signed and placed on the chart.  A 5-French  arterial sheath was placed in the right femoral artery using a modified  Seldinger technique.   Several catheters were used to try engage the left main including a JL-  4, a JL-5, a multipurpose catheter, and an AL-1.  It appeared that the  left main was totally occluded at the ostium.  Whether or not this was  due to coronary disease or involvement of dissection was unknown.   We then tried to cannulate the right coronary.  We were unable to  cannulate this as well.   We were able to cannulate all 3 vein grafts.  We used a JL-4 for the  right coronary vein graft, we used an AL-1 for the saphenous vein graft  to the diagonal, and we used the left coronary bypass graft catheter for  the saphenous vein  graft to the OM system.   All catheter exchanges made over wire.  There were no apparent  complications.   FINDINGS:  Left main:  This was totally occluded at the ostium.  As this  was normal previously, I suspect it is involved in the dissection plane.   Right coronary artery:  We were unable to cannulate this.   Saphenous vein graft:  Sequential graft to the RV marginal and PDA was  patent.  There was mild luminal disease throughout the graft.  The acute  marginal was totally occluded after insertion of the vein graft.  There  was good flow into the PDA.  There was significant coronary artery  disease in the distal RCA system.   Saphenous vein graft to the diagonal was widely patent.   Saphenous vein sequential graft to the OM-1 and OM-2 was widely patent.   The left subclavian was not injected due to involvement with a  dissection on CT.   CONCLUSION:  Severe coronary artery disease.  As described above, his  vein grafts are patent.  PLAN/DISCUSSION:  He will proceed to the OR for emergent repair of his  type 1 dissection.      Bevelyn Buckles. Bensimhon, MD  Electronically Signed     DRB/MEDQ  D:  06/21/2009  T:  06/22/2009  Job:  [id number]

## 2011-05-09 NOTE — Op Note (Signed)
NAME:  TAKARI, LUNDAHL NO.:  [id number]   MEDICAL RECORD NO.:  [id number]          PATIENT TYPE:  INP   LOCATION:  2015                         FACILITY:  [facility]   PHYSICIAN:  Eulas Post, MD    DATE OF BIRTH:  May 07, 1961   DATE OF PROCEDURE:  06/27/2009  DATE OF DISCHARGE:                               OPERATIVE REPORT   PREPROCEDURE DIAGNOSIS:  Right first metatarsophalangeal joint gout  flare.   POSTPROCEDURE DIAGNOSIS:  Right first metatarsophalangeal joint gout  flare.   PROCEDURE:  Injection of the first metatarsophalangeal joint.   ATTENDING SURGEON:  Eulas Post, MD   PROCEDURE DETAILS:  After informed verbal consent was obtained, the  right foot was prepped with alcohol and a 23-gauge needle was used to  inject 2 mL of Xylocaine and 1 mL of Celestone.  The patient tolerated  the procedure, and a Band-Aid was placed.  He had almost instantaneous  improvement in his symptoms.  He will be placed in a hard-sole shoe, and  I will follow up with him tomorrow.      Eulas Post, MD  Electronically Signed     JPL/MEDQ  D:  06/27/2009  T:  06/28/2009  Job:  [id number]

## 2011-05-09 NOTE — Discharge Summary (Signed)
NAME:  Chad [name], Chad [name] NO.:  [id number]   MEDICAL RECORD NO.:  [id number]          PATIENT TYPE:  INP   LOCATION:  2023                         FACILITY:  [facility]   PHYSICIAN:  Noralyn Pick. Eden Emms, MD, FACCDATE OF BIRTH:  [birth year]   DATE OF ADMISSION:  07/01/2009  DATE OF DISCHARGE:                               DISCHARGE SUMMARY   PRIMARY CARDIOLOGIST:  Bevelyn Buckles. Bensimhon, MD   DISCHARGE DIAGNOSES:  Atypical chest pain.  Status post recent repair of  aortic dissection and revision of vein graft - pulmonary artery - first  obtuse marginal/second obtuse marginal.   SECONDARY DIAGNOSES:  1. History of aortic dissection (type 1).  2. Coronary artery disease.  2A.  Status post coronary artery bypass grafting with revision, June  2010.  1. Hypertension.  2. Dyslipidemia.  3. Chronic kidney disease.  4. Anemia.  5. Asthma.   REASON FOR ADMISSION:  [name] is a 50 year old male, who is just  discharged from [facility] on the day prior to this admission,  after undergoing repair of an aortic dissection, by Dr. Kathlee Nations Trigt,  on June 21, 2009, with concomitant revision to the vein graft to the PDA  and OM1/OM2.   He presented with complaint of constant anterior chest discomfort and  was admitted for further evaluation.   HOSPITAL COURSE:  Serial cardiac markers were drawn and notable for  normal total CPKs and marginally elevated MB of 4.2; troponins were  elevated, however, with a peak of 1.94, trending down to 1.30 prior to  discharge.  The symptoms were reproducible by palpation.  A 2-D  echocardiogram did not reveal evidence of pericardial effusion.   The patient was followed by Dr. Kathlee Nations Trigt during this brief stay,  who felt that the incision appeared stable, he was thus cleared for  discharge the following day.   Prior to discharge, follow up blood work was drawn, including a CBC and  a BMET.  If stable, the patient will be cleared  for discharge.   MEDICATIONS AT DISCHARGE:  1. Coreg 25 mg b.i.d.  2. Apresoline 25 mg t.i.d.  3. Coated aspirin 81 mg daily.  4. Digoxin 0.125 mg every other day.  5. Zocor 40 mg at bedtime.  6. Neurontin 300 mg b.i.d.  7. Colchicine 0.6 mg b.i.d.  8. Allopurinol 300 mg daily.   INSTRUCTIONS:  1. The patient is instructed to keep his previously scheduled follow      up with Dr. Kathlee Nations Trigt.  2. Follow up with Dr. Arvilla Meres, as previously scheduled.  3. The patient is to not resume Aldactone, which he states he had been      on for several years, pending follow up and further recommendations      by Dr. Gala Romney.  During this brief stay, this medication was not      continued, although was listed as one of his previous discharge      medications.   DISPOSITION:  Stable.   ACCESSORY DATA:  WBC 10.2, hemoglobin 9.0, hematocrit 26.8, and platelet  444, with followup CBC  pending at discharge.  Sodium 138, potassium 5.0,  glucose 131, BUN 18, creatinine 1.9, with followup BMET pending at  discharge.   Discharge encountering, including physician time, greater than 30  minutes.      Gene Serpe, PA-C      Peter C. Eden Emms, MD, [facility]  Electronically Signed    GS/MEDQ  D:  07/03/2009  T:  07/03/2009  Job:  [id number]   cc:   Kerin Perna, M.D.

## 2011-05-09 NOTE — H&P (Signed)
NAME:  Chad [name], Chad [name] NO.:  [id number]   MEDICAL RECORD NO.:  [id number]          PATIENT TYPE:  INP   LOCATION:  4732                         FACILITY:  [facility]   PHYSICIAN:  Wendi Snipes, MD DATE OF BIRTH:  10-19-1961   DATE OF ADMISSION:  06/21/2009  DATE OF DISCHARGE:                              HISTORY & PHYSICAL   TIME OF EVALUATION:  4:30 a.m.   Cardiologist, Dr. Gala Romney.  Primary doctor is Dr. Leslee Home.   CHIEF COMPLAINT:  ICD shock and a fall.   HISTORY OF PRESENT ILLNESS:  This is a 50 year old African American male  with a history of ischemic cardiomyopathy, ejection fraction of 35-40%  status post coronary artery bypass graft and ICD here with a single ICD  shock complicated by a fall.  He states that he was in his usual state  of health and being active around the house doing chores including  washing dishes, cooking dinner, and washing clothes, when he states that  he rose from a bent position and felt a lump in the throat.  It was at  this time that he received an ICD shock which startled him.  He fell  quite hard and subsequently experienced his anginal equivalent of chest  pain, leg pain and profuse diaphoresis.  He states that this was quite  severe in regards to his usual angina.  He states recently he has been  at his baseline status as far as chest pain is concerned and experiences  it only occasionally.  He does report some mild increased lower  extremity edema in the last few days.  Otherwise he denies palpitations,  syncope, presyncope, paroxysmal nocturnal dyspnea, or orthopnea.  He  also states that he has been out of work and out of his medications  recently.   PAST MEDICAL HISTORY:  1. Ischemic cardiomyopathy status post [employer] ICD.      The most recent EF was in May 2010 with an EF of 35-40%.  2. Coronary artery disease status post coronary artery bypass grafts      in 2006 with LIMA to the LAD, SVG  to diagonal, SVG to OM1 an OM2      SVG to RCA, PDA and posterior lateral branch.  3. History of non-ST elevation MI in 2006.  4. Hypertension.  5. Hyperlipidemia.  6. Low back pain/radiculopathy currently scheduled to see Dr. Dutch Quint on      Tuesday July 29.   ALLERGIES:  SULFA, LATEX AND SHELLFISH.   MEDICATIONS ON ADMISSION:  1. Aldactone 25 mg daily.  2. Lisinopril 40 mg daily.  3. Hydrochlorothiazide 25 mg daily.  4. Pravastatin 40 mg daily.  5. Digoxin 0.25 mg daily.  6. Hydralazine 50 mg t.i.d.  7. Hydrocodone as needed.  8. Aspirin 325 mg daily.  9. Coreg 6.25 mg twice daily.  10.Flexeril 10 mg daily.   SOCIAL HISTORY:  Lives in [place] with 25-year-old daughter.  He is  currently unemployed.  He has a fiance.  He does not have smoking  history.   FAMILY HISTORY:  His father died of myocardial infarction  at a young age  and had his first myocardial infarction at 86.   REVIEW OF SYSTEMS:  All 14 systems were reviewed and were negative  except as mentioned in the detailed HPI.   PHYSICAL EXAM:  VITAL SIGNS:  His blood pressure is 150/83, his  respiratory rate is 15.  His pulse is 53.  He is satting at 100% on 2  liters nasal cannula.  GENERAL: He is a 50 year old African American male appearing stated age  in mild acute distress.  HEENT: Moist mucous membranes.  Pupils are  equal, round, reactive to light and accommodation.  Anicteric sclerae.  NECK:  No jugular venous distention.  No thyromegaly.  CARDIOVASCULAR:  Regular rate and rhythm without murmurs, rubs or  gallops.  LUNGS: Clear to auscultation bilaterally.  ABDOMEN:  Nontender, nondistended.  Positive bowel sounds.  No masses.  EXTREMITIES: No clubbing, cyanosis, or edema.  NEUROLOGIC: Alert and oriented x3.  Cranial nerves II-XII are grossly  intact.  No focal neurologic deficits.  SKIN:  Warm, dry and intact.  No rashes.  PSYCH: mood and affect are appropriate.   RADIOLOGY:  Lumbar x-ray showed  subluxation of L4 and L5. EKG rate was  normal sinus rhythm at a rate of 60 beats per minute with LVH and  chronic lateral T-wave inversions. Laboratory values are pending.   ASSESSMENT/PLAN:  This is a 50 year old African American male with an  ischemic cardiomyopathy status post implantable cardioverter  defibrillator here with implantable cardioverter defibrillator shock and  subsequent symptoms of severe angina complicated by a fall exacerbating  his low back pain.  1. Implantable cardioverter defibrillator shock. It is apparent that      the patient has current baseline symptoms, though these severe      symptoms surrounding the ICD discharge are concerning.  We will      admit him to the hospital as the patient is quite concerned over      this ICD shock and would like to have a full evaluation prior to      his discharge.  Will cycle cardiac enzymes, though it is probably      mildly elevated secondary to his ICD discharge.  We will      interrogate the pacemaker.  2. Ischemic cardiomyopathy.  He is currently euvolemic and will      restart his medications that he has been off for quite some time.  3. Low back pain. The emergency department ordered a myelogram for      later today and will consider consulting Dr. Ethelene Browns team as he is      going to have an appointment tomorrow that he has made some effort      in obtaining.      Wendi Snipes, MD  Electronically Signed     BHH/MEDQ  D:  06/21/2009  T:  06/21/2009  Job:  [id number]

## 2011-05-09 NOTE — H&P (Signed)
NAME:  Chad [name], Chad [name] NO.:  [id number]   MEDICAL RECORD NO.:  [id number]          PATIENT TYPE:  OBV   LOCATION:  3707                         FACILITY:  [facility]   PHYSICIAN:  Chad Guise., M.D.DATE OF BIRTH:  1961-07-03   DATE OF ADMISSION:  08/09/2007  DATE OF DISCHARGE:                              HISTORY & PHYSICAL   INDICATION FOR ADMISSION:  Chest pain.   PRIMARY CARDIOLOGIST:  Dr. Nicholes [name], [facility].   HISTORY OF PRESENT ILLNESS:  Chad [name] is a 50 year old African  American male with a past medical history of coronary artery disease  (status post coronary artery bypass grafting June 2006), ischemic  cardiomyopathy (EF of 30-35%), hypertension, dyslipidemia, chronic renal  insufficiency with baseline creatinine of 1.5, who presents with 4 days  of worsening chest pain.  The patient reports his symptoms started  approximately 4 days ago, initially as a sharp pain in his anterior  chest, progressing to a constant squeezing.  He denies any shortness  of breath or nausea, but does report some diaphoresis.  He states he  initially tried nitroglycerin with some improvement in his symptoms;  however, now his symptoms have worsened, and he can't get any relief.  He states it is different from my heart attack pain, not as severe.  At this time, he denies any significant exertional worsening.  No  positional changes.  No fever or cough.  He does report that earlier in  the week he had approximately 5-10 pound weight gain.  He added back his  Lasix.  His weight returned to normal, and his shortness of breath  totally resolved.  He has stable 1-pillow orthopnea.  No PND.  He does  have occasional palpitation with my heart skipping.  He has also tried  a handful of aspirin, and this did provide him some relief of his  symptoms.  He denies any indigestion or heartburn.  No abdominal pain.  No lower extremity edema.  All other review of systems  are negative.   CURRENT MEDICATIONS:  1. Aspirin 81 mg daily.  2. Digoxin 0.25 mg daily.  3. Aldactone 25 mg daily.  4. Toprol-XL 150 mg daily.  5. Norvasc 5 mg daily.  6. Lisinopril/HCTZ 40/25 mg daily.  7. Lipitor 40 mg daily.  8. Lasix 80 mg as needed.   ALLERGIES:  CONTRAST DYE.   FAMILY HISTORY:  Positive for heart disease with his father who started  having heart problems in his 30s.   SOCIAL HISTORY:  He is single.  He denies any alcohol or tobacco at this  time.  He has 8 children and 8 grandchildren.   EXAM:  VITAL SIGNS:  He is afebrile.  Temperature is 98.2, blood  pressure is 160/90, heart rate is 86 and regular.  He is saturating 96%  on room air.  GENERAL:  He is a very pleasant middle-aged [place] American male, alert  and oriented x4, in no acute distress.  NECK:  He has no JVD and no bruits.  LUNGS:  Clear.  HEART:  Regular with soft S4 noted.  ABDOMEN:  Soft, nontender, and nondistended.  No rebound or guarding.  EXTREMITIES:  Warm with 2+ pulses and no edema.  NEUROLOGIC:  Strength is 5/5 and equal bilaterally.   Blood work shows a white count of 3.9, hemoglobin 14.8, platelet count  of 157.  He has got a potassium of 3.5, BUN and creatinine of 16 and  1.2.  Myoglobin of 59.  MB of less than 1.0, troponin I of less than  0.05.   Chest x-ray shows no acute cardiopulmonary disease, with stable  placement of a left-sided ICD.  His ECG shows normal sinus rhythm 83 per  minute with T-wave inversions and ST depressions noted in his  inferolateral leads which are unchanged from his prior tracings.  His  last cardiac catheterization was done 05/15/07.  This showed native  vessel disease with a 60% stenosis noted in the mid-LAD with competitive  flow distally.  Circumflex artery gave rise to a small ramus branch and  marginal branch that was completely occluded.  The right coronary was a  moderate-sized vessel that gave rise to a PDA and posterolateral branch.   The posterolateral branch had 80% stenosis in the proximal portion.  There was 90% stenosis in the distal right coronary.  There was  competing flow in the PDA branch noted.  Vein graft to the acute  marginal and to the posterolateral branch of the RCA was patent.  There  was a 30% narrowing at the proximal and mid portion of the graft.  The  vein graft to the ramus and marginal branch of the circumflex artery was  patent and functioning normally.  The vein graft to the diagonal and LAD  was patent and functioning normally.  The LIMA graft to the LAD was  atrophic but patent.  The left ventricle showed apical wall akinesis  with an EF of 35-40%.   IMPRESSION:  1. Chest pain with some typical but mostly atypical qualities.  2. History of coronary artery disease (status post coronary artery      bypass grafting with cath done 4 months ago showing patent grafts).  3. Hypertension.  4. Dyslipidemia.   PLAN:  The patient will be admitted for 24-hour observation.  We will  try a trial of Toradol to help with his pain.  We will check D-dimer and  serial cardiac enzymes.  We will continue his home medicines as before.  I discussed treatment with him at length, and he understands our current  plan.      Chad Guise., M.D.  Electronically Signed     TWK/MEDQ  D:  08/09/2007  T:  08/09/2007  Job:  [id number]   cc:   Chad Buckles. Bensimhon, MD

## 2011-05-09 NOTE — Discharge Summary (Signed)
NAME:  Chad [name], Chad [name] NO.:  [id number]   MEDICAL RECORD NO.:  [id number]          PATIENT TYPE:  INP   LOCATION:  2003                         FACILITY:  [facility]   PHYSICIAN:  Duke Salvia, MD, FACCDATE OF BIRTH:  06-26-1961   DATE OF ADMISSION:  02/14/2008  DATE OF DISCHARGE:  02/17/2008                               DISCHARGE SUMMARY   PRIMARY CARDIOLOGIST:  Dr. Arvilla Meres.   PRIMARY CARE Carola Viramontes:  Dr. Leslee Home.   DISCHARGE DIAGNOSIS:  Chest pain.   SECONDARY DIAGNOSES:  1. Coronary artery disease, status post coronary artery bypass graft      x7 in 2006.  2. Ischemic cardiomyopathy with an ejection fraction of  25% to 30%.  3. Chronic systolic congestive heart failure.  4. Hypertension.  5. Hyperlipidemia.  6. Status post [employer] implantable cardioverter defibrillator.  7. History of pleuritic chest pain.  8. Remote history of asthma.  9. Chronic kidney disease.  10.Remote history of pancreatitis.   ALLERGIES:  IODINE, SULFA, SHELLFISH.   PROCEDURES:  None.   HISTORY OF PRESENT ILLNESS:  A 50 year old African-American male with  prior history of CAD and ischemic cardiomyopathy who presented to the  [facility] ED February 14, 2008, with a 36-hour history of constant  sharp chest pain.  There was a pleuritic component as well as an  underlying pressure sensation.  In the emergency room, he was  hypertensive with a blood pressure of 157/101.  He was admitted for  further evaluation.   HOSPITAL COURSE:  The patient was placed on hydralazine and nitrate  therapy with improvement in blood pressure; however, he also began to  feel lightheaded and he became hypotensive with pressures in the 80s.  This resulted in fluid bolus as well as holding his hydralazine, which  was subsequently decreased and then altogether discontinued.  He also  had fairly significant headaches with isosorbide.  He had no additional  chest discomfort over the weekend,  and, after discussion with Dr.  Gala Romney, the decision has been made to discharge today with plans for  followup as an outpatient.   DISCHARGE LABORATORIES:  Hemoglobin 13.5, hematocrit 40.5, WBC 5.4,  platelets 139, MCV 82.6.  Sodium 142, potassium 3.9, chloride 106, CO2  28, BUN 17, creatinine 1.19, glucose 108, total bilirubin 1.5, alkaline  phosphatase 61, AST 15, ALT 15, albumin 3.8.  Cardiac markers negative  x3.  Total cholesterol 183, triglycerides 180, HDL 36, LDL 111, calcium  9.3.  Urine drug screen was negative.  TSH 1.428.  Digoxin 1.0.  Admission BNP 126.   DISPOSITION:  The patient is being discharged home today in good  condition.   FOLLOWUP PLANS AND APPOINTMENTS:  He is to follow up with Dr. Gala Romney  on March 5 at 9:00 a.m.  He is asked to follow up with Dr. Lorenz Coaster as  previously scheduled.   DISCHARGE MEDICATIONS:  1. Aspirin 325 mg daily.  2. Lisinopril/HCTZ 40/25 mg daily.  3. Digoxin 0.25 mg daily.  4. Lasix 80 mg daily.  5. K-Dur 20 mEq b.i.d.  6. Spirolactone 25 mg daily.  7. Coreg  12.5 mg b.i.d.  8. Pravachol 20 mg q.h.s.  9. Norvasc 2.5 mg daily.  10.Nitrostat 0.4 mg sublingual p.r.n. chest pain.   OUTSTANDING LABORATORY STUDIES:  None.   DURATION OF DISCHARGE ENCOUNTER:  Forty minutes, including physician  time.      [name], ANP      Duke Salvia, MD, [facility]  Electronically Signed    CB/MEDQ  D:  02/17/2008  T:  02/17/2008  Job:  [id number]   cc:   Reuben Likes, M.D.

## 2011-05-09 NOTE — H&P (Signed)
NAME:  Chad [name], Chad [name] NO.:  [id number]   MEDICAL RECORD NO.:  [id number]          PATIENT TYPE:  INP   LOCATION:  3013                         FACILITY:  [facility]   PHYSICIAN:  Eduard Clos, MDDATE OF BIRTH:  1961-06-02   DATE OF ADMISSION:  05/04/2009  DATE OF DISCHARGE:                              HISTORY & PHYSICAL   PRIMARY CARE PHYSICIAN:  [facility] for which he has not  gone for more than three years.  Presently unassigned.   PRIMARY CARDIOLOGIST:  Bevelyn Buckles. Bensimhon, MD   CHIEF COMPLAINT:  Low back pain.   HISTORY OF PRESENT ILLNESS:  A 50 year old male with known history of  ischemic cardiomyopathy, status post CAD, status post ICD placement,  presents with complaints of low back pain.  It has been present for the  last six months, but over the last few days has been increasing in  intensity, putting pressure on his legs and causing intense pain in the  low back.  The pain is diffusely in the lower lumbar area which shoots  all the way down to his right lower extremity.  He was recently in the  ER where he was going to his own tapering dose, despite which the pain .  The patient came to the ER no acute changes, and the patient is being  admitted for further observation and evaluation of his pain.  The  patient denies any fall or any incontinence of urine or fecal  incontinence.  There is no weakness of his limbs except that he has  intense pain being crushed on it.  The patient denies any chest pain,  shortness of breath, headache, dizziness, loss of consciousness, fever,  chills.   PAST MEDICAL HISTORY:  1. Ishemic cardiomyopathy, status post ABG.  2. History of hypertension.  3. Hyperlipidemia.   PAST SURGICAL HISTORY:  1. Low back surgery.  2. CABG.  3. ICD placement.   MEDICATIONS PER ADMISSION:  1. Aldactone 25 mg p.o. daily.  2. Lisinopril/HCTZ 40/25 mg p.o. daily.  3. Pravastatin 40 mg p.o. daily.  4. Digoxin 0.25  mg daily.  5. Hydralazine 25 mg p.o. daily.  6. Hydrocodone/acetaminophen 5/325 mg p.o. q.4 h p.r.n. pain.  7. Aspirin 325 mg p.o. daily.  8. Ibuprofen.  9. Coreg 12.5 mg one and a half tablets p.o. b.i.d.  10.Prednisone tapering dose.   ALLERGIES:  Contrast dye.   FAMILY HISTORY:  Significant for his father having CAD and dying off at  the age of 31.   SOCIAL HISTORY:  The patient denies smoking, drinking alcohol or using  illegal drugs.   REVIEW OF SYSTEMS:  As per history of present illness, nothing else  significant.   PHYSICAL EXAMINATION:  GENERAL:  The patient was examined at bedside.  No acute distress.  VITAL SIGNS:  Blood pressure 170/110, pulse 70 per minute, temperature  97.9, respirations 18 per O2 saturation.  HEENT:  Nonicteric, no pallor.  CHEST:  Bilateral air entry present.  No rhonchi.  No crepitation.  HEART:  S1 S2 heard.  ABDOMEN:  Soft, nontender.  Bowel sounds heard.  CNS:  Alert and oriented to time, place and person.  Moves upper and  lower extremities.  There is pain in the lower back when he moves his  right leg, but not much when he raises the left leg.  EXTREMITIES:  Peripheral pulses felt.  No edema.   LABORATORY DATA:  CBC:  WBC 4.6, hemoglobin 14.9, hematocrit 44.3,  platelets 140.  Basic metabolic panel:  Sodium 134, potassium 3.7,  chloride 102, CO2 26, glucose 114, BUN 13, creatinine 0.9, calcium 8.5,  UA negative.   RADIOLOGY:  CT of the lumbosacral spine shows spondylosis and  degenerative changes as noted above with dominant findings at L5-S1  levels bladder.   ASSESSMENT:  1. Intractable low back pain with degenerative changes in L4-L5, L5-S1      levels.  2. Coronary artery disease status post cerebrovascular accident.  3  Ischemic cardiomyopathy.  1. Hypertension, uncontrolled.  2. Hyperlipidemia.   PLAN:  Will admit to medical floor.  Place patient on IV steroids at  this time.  Will get PT/OT consult.  Resume his home  medication.  It he  persists, then probably will have to consult neurosurgery.      Eduard Clos, MD  Electronically Signed     ANK/MEDQ  D:  05/04/2009  T:  05/04/2009  Job:  [id number]

## 2011-05-09 NOTE — Cardiovascular Report (Signed)
NAME:  Chad [name], Chad [name] NO.:  [id number]   MEDICAL RECORD NO.:  [id number]          PATIENT TYPE:  INP   LOCATION:  3735                         FACILITY:  [facility]   PHYSICIAN:  Everardo Beals. Juanda Chance, MD, FACCDATE OF BIRTH:  Feb 18, 1961   DATE OF PROCEDURE:  05/15/2007  DATE OF DISCHARGE:                            CARDIAC CATHETERIZATION   PROCEDURE:  Cardiac catheterization and graft study.   CLINICAL HISTORY:  Mr. Prater is 50 years old and had bypass surgery  in 2006.  He has an ischemic cardiomyopathy with an ejection fraction of  30-35% and has an ICD in place.  He is admitted with recurrent chest  pain and Dr. Gala Romney arranged for him to be evaluated with angiography  today.   PROCEDURE:  The procedure was performed via the right femoral artery  arterial using arterial sheath and 6-French performed coronary  catheters.  A front wall arterial puncture was performed and Omnipaque  contrast was used.  The LIMA catheter was used for injection of the LIMA  graft.  An AL-1 catheter used for injection of the vein graft to the  circumflex and diagonal branches.  The patient tolerated the procedure  well.  The right femoral artery was closed with Angioseal at the end of  the procedure.   RESULTS:  The left main coronary was free of disease.   Left anterior descending artery gave rise to a large septal perforator  and then there was competing flow distally.  There was 60% stenosis  before the competing flow distally.   The circumflex artery  gave rise to a small ramus branch, a marginal  branch that was completely occluded and a small posterolateral branch.   The right coronary artery __________ was a moderate-sized vessel and  gave rise to the posterior descending branch and the posterolateral  branch. The posterolateral branch had 80% stenosis in its proximal  portion.  There was 90% stenosis in the distal right coronary artery and  __________. There is  competing flow with the vein graft in the posterior  descending branch.   The saphenous vein graft to the acute marginal post stenting of  posterolateral  branch to the right coronary was patent.  There was 30%  narrowing in the proximal and mid portion of the graft.  It appeared  that the distal connection was to the posterolateral branch which was  not visualized on the native circulation.   The saphenous vein graft to the ramus branch of the circumflex artery  and marginal branch of the circumflex artery was patent and functioned  normally.   The saphenous vein graft to the diagonal branch and LAD was patent and  functioned normally.   The LIMA graft to the LAD was atrophic but patent.   The left ventricle __________ the RAO projection showed apical wall  akinesis.  The overall wall motion was moderately reduced.  The  estimated ejection fraction was 35-40%.   The aortic pressure was 131/95 with a mean of 111 and the left  ventricular pressure was 131/11.   CONCLUSION:  1. Coronary artery disease status post coronary bypass  graft surgery      2006.  2. Severe native vessel disease with 60% stenosis in the proximal LAD,      total occlusion of a ramus branch of the circumflex artery and a      marginal branch of the circumflex artery, 90% stenosis in the      distal right coronary with 80% stenosis in the second      posterolateral branch of the right coronary artery.  3. Patent vein graft to the acute marginal post descending and      posterolateral branch of the right coronary with 30% narrowing in      the proximal and mid portion, patent vein graft to the ramus and      marginal branch of the circumflex artery, patent vein graft to the      diagonal branch of the LAD, and atretic but patent LIMA graft to      the mid LAD with no significant disease in the distal LAD.  4. Inferoapical wall akinesis with an estimated fraction of 35-40%.   RECOMMENDATIONS:  All the grafts  are patent.  The patient has potential  ischemia in the second posterolateral branch of the right coronary. This  is not a large vessel and it would be difficult to treat with  percutaneous intervention.  Will plan reassurance and continued medical  therapy.      Bruce Elvera Lennox Juanda Chance, MD, [facility]  Electronically Signed     BRB/MEDQ  D:  05/15/2007  T:  05/15/2007  Job:  [id number]   cc:   Bevelyn Buckles. Bensimhon, MD
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
## 2011-05-09 NOTE — Assessment & Plan Note (Signed)
[facility]                            CARDIOLOGY OFFICE NOTE   CLEVLAND, CORK                     MRN:          [id number]  DATE:10/06/2008                            DOB:          1961/03/10    PRIMARY CARE PHYSICIAN:  Reuben Likes, MD   HISTORY:  Chad [name] is a very pleasant 50 year old male with history of  coronary artery disease status post previous myocardial infarction and  bypass surgery in 2006.  He also has a history congestive heart failure  secondary to ischemic cardiomyopathy.  Previous ejection fraction was  40%; however, in the past year this has declined to about the 25-30%  range with some LV dilatation.  He also has a history of single chamber  ICD, poorly controlled hypertension, hyperlipidemia, mild renal  insufficiency, mild renal insufficiency, and medical noncompliance.   He returns today for routine followup.  He says he is doing great.  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  He says he walks all the time  up and down hills without any chest pain or shortness of breath.  He has  been taking his blood pressure occasionally at home.  It is usually in  the 120-130 range, the highest he gets is 140 over the mid 90s.  He has  not had any orthopnea, no PND, no lower extremity edema.  He is  compliant with his medications.   CURRENT MEDICATIONS:  1. Digoxin 0.25 a day.  2. Aldactone 25 a day.  3. Aspirin 325 a day.  4. Coreg 12.5 b.i.d.  5. Pravachol 40 a day.  6. Lisinopril 20/12.5 b.i.d.  7. Hydralazine 25 b.i.d.  8. Viagra p.r.n.   He is intolerant of NITROGLYCERIN due to severe headaches and he stopped  NORVASC since it makes his finger joints hurt to the point where he  could not write.   PHYSICAL EXAMINATION:  GENERAL:  He is in no acute distress.  He  ambulates around the clinic without any respiratory difficulty.  VITAL SIGNS:  Blood pressure was 150/110, heart rate of 61, weight is  214 which is  down 2 pounds.  HEENT:  Normal.  NECK:  Supple.  There is no JVD.  Carotids are 2+ bilaterally without  any bruits.  There is no lymphadenopathy or thyromegaly  CARDIAC:  PMI is laterally displaced.  He is regular with an S4.  No  murmur.  LUNGS:  Clear.  ABDOMEN:  Soft, nontender, nondistended.  No obvious splenomegaly.  No  bruits.  No masses.  Good bowel sounds.  EXTREMITIES:  Warm with no cyanosis, clubbing, or edema.  No rash.  NEUROLOGIC:  Alert and oriented x3.  Cranial nerves II through XII are  grossly intact.  Moves all 4 extremities without difficulty.  Affect is  pleasant.   EKG shows sinus rhythm with LVH and diffuse T-wave inversions which are  chronic.   ASSESSMENT AND PLAN:  1. Congestive heart failure secondary to ischemic cardiomyopathy.  He      is New York Heart Association class I to II.  He is euvolemic.  We      will go ahead and try and titrate his Coreg gently, although his      heart rate is a little bit low, but we will see if we can get him      up to 18.75 b.i.d.  2. Hypertension.  Once again, blood pressure is significantly      elevated.  We will titrate his Coreg.  I told him if he is unable      to tolerate this, then just go ahead and bring his Coreg back down      to 12.5 b.i.d. and increase his hydralazine to 50 b.i.d.  3. Coronary artery disease.  This is stable.  No evidence of ischemia.      Continue current therapy.   DISPOSITION:  We will see him back in 6 weeks for a followup on his  blood pressure.      Bevelyn Buckles. Bensimhon, MD  Electronically Signed    DRB/MedQ  DD: 10/06/2008  DT: 10/07/2008  Job #: [id number]   cc:   Reuben Likes, M.D.

## 2011-05-09 NOTE — Discharge Summary (Signed)
NAME:  Chad [name], Chad [name] NO.:  [id number]   MEDICAL RECORD NO.:  [id number]          PATIENT TYPE:  INP   LOCATION:  5529                         FACILITY:  [facility]   PHYSICIAN:  Bevelyn Buckles. Bensimhon, MDDATE OF BIRTH:  [birth year]   DATE OF ADMISSION:  07/17/2009  DATE OF DISCHARGE:  07/28/2009                               DISCHARGE SUMMARY   PRIMARY CARDIOLOGIST:  Dr. Gala Romney.   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   SECONDARY DIAGNOSES:  1. Status post right L5-S1 laminotomy and microdiskectomy this      admission performed by Dr. Julio Sicks.  2. Chest pain/coronary artery disease status post coronary artery      bypass grafting.  3. History of type A aortic dissection status post repair June 21, 2009.  4. Hypertension.  5. Hyperlipidemia.  6. Gout.  7. Deconditioning.  8. Asthma.  9. Anemia.  10.History of pancreatitis.  11.Mild chronic renal insufficiency.  12.Chronic systolic congestive heart failure.  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  14.Status post Guidant vitality defibrillator.   ALLERGIES:  SULFA, LATEX, NORVASC, SEAFOOD, CONTRAST MEDIA.   PROCEDURES:  CT of the head showed no acute abnormality.  CT of the  cervical spine showing advanced cervical degenerative changes with  spondylosis from C3-C4 and C6-C7.  There is narrowing of the canal in  that region.  CT myelography showing moderately large anterior extra  defect at L5-S1 compatible with disk protrusion.  Right L5-S1 laminotomy  and microdiskectomy performed July 23, 2009.   HISTORY OF PRESENT ILLNESS:  A 50 year old African male with above  complex problem list.  He was in his usual state of health until July 17, 2009, when began to experience left-sided chest discomfort different  from previous angina as well as pain he had experienced with his aortic  dissection.  He presented to [facility] where chest  x-ray and ECGs  were nonacute.  Cardiac markers were negative.  He was admitted for  evaluation.   HOSPITAL COURSE:  The patient ruled out for MI.  He complained of  ongoing lower extremity pain, and neurosurgery was contacted.  A  myelogram was performed and showed L5-S1 disk protrusion.  We felt that  he was at acceptable risk to proceed with surgery, and the patient  underwent left L5-S1 laminotomy and microdiskectomy on July 23, 2009.  Postoperatively, he did have some recurrent chest discomfort relieved by  nitroglycerin.  However, cardiac markers have remained negative.  He has  remained euvolemic throughout his hospitalization and maintained on beta-  blocker and ACE inhibitor therapy.  From a neurosurgical standpoint, the  patient has been seen by physical therapy as well as occupational  therapy.  Initially, he was slow to progress, though he is now  ambulating with less back discomfort.  He will follow with Dr. Jordan Likes in  approximately 1 week.  He is being discharged home today in good  condition.   DISCHARGE LABORATORY DATA:  Hemoglobin 8.7, hematocrit 26.4, WBC 6.6,  platelets 246.  Sodium 135, potassium 3.9,  chloride 99, CO2 28, BUN 10,  creatinine 1.68, glucose 94, calcium 8.6.  Cardiac markers have been  negative.  Iron 15, TIBC 227, UIBC 212, vitamin B12 526, folate 10.8,  ferritin 192.  Digoxin level 0.5.   DISPOSITION:  The patient will be discharged home today in good  condition.   FOLLOW-UP APPOINTMENTS:  He is asked to follow with Dr. Jordan Likes in about 1  week.  This will be arranged through their office.  He is to follow up  with Dr. Gala Romney on October 22 at 9:30 a.m.  He will have home health  PT.   DISCHARGE MEDICATIONS:  1. Amlodipine 5 mg daily.  2. Hydrochlorothiazide 12.5 mg daily.  3. Lisinopril 20 mg daily.  4. Nitroglycerin 0.4 mg sublingual p.r.n. chest pain.  5. Tylenol 500 mg 1-2 tablets q.4-6 h. P.r.n.  6. Allopurinol 100 mg 2 tablets daily.  7.  Aspirin 81 mg daily.  8. Carvedilol 25 mg b.i.d.  9. Digoxin 0.25 mg daily.  10.Gabapentin 300 mg b.i.d.  11.Hydralazine 25 mg t.i.d.  12.Percocet 5/325 mg 2 tablets q.6 h. p.r.n.  13.Simvastatin 40 mg daily.   OUTSTANDING LABORATORY STUDIES:  None.   DURATION OF DISCHARGE ENCOUNTER:  35 minutes including physician time.      [name], ANP      Bevelyn Buckles. Bensimhon, MD  Electronically Signed    CB/MEDQ  D:  07/28/2009  T:  07/28/2009  Job:  [id number]   cc:   Henry A. Pool, M.D.

## 2011-05-09 NOTE — Assessment & Plan Note (Signed)
OFFICE VISIT   Chad [name], Chad [name]  DOB:  01-31-1961                                        September 08, 2010  CHART #:  [id number]   CURRENT PROBLEMS:  1. Status post emergency repair of a type A ascending aortic      dissection with a hemiarch reconstruction of the ascending aorta      using a 28-mm Hemashield graft with redo sternotomy and revision of      previous bypass grafts in June 2010.  2. Hypertension.   PRESENT ILLNESS:  The patient returns to the office 1 year after  emergency repair of a type A dissection for surgical followup of his  thoracic aortic disease.  Three weeks ago, he was admitted with a  nonSTEMI and underwent a stent to his right coronary after he occluded a  vein graft to the posterior descending.  At that time, his LIMA was  opened to his LAD and vein graft was widely patent of the circumflex.  His overall EF remains stable at about 40%.  He has _preserved aortic  valve function by a recent 2-D echo, when he was hospitalized.  A CT  scan of the chest shows his ascending aortic repair and hemiarch  reconstruction to be intact without pseudoaneurysm or evidence of  recurrent problem.  The false lumen remains patent from the distal arch  down the descending thoracic aorta into the abdomen, but he has good  visceral perfusion.  Left kidney is somewhat smaller now, as the  perfusion left kidney comes mainly off the false lumen.  He has been on  Plavix and aspirin since he had the stent placed to the RCA and he has  had no recurrent angina.  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   PHYSICAL EXAMINATION:  Vital Signs:  His blood pressure today is 130/70,  pulse is 73, respirations 18, saturation 96%.  General:  He is alert and  pleasant.  Lungs:  Breath sounds are clear and equal.  Chest: The  sternum is well healed.  He has no cardiac murmur and he is in the sinus  rhythm.  Peripheral pulses are intact and there is no  neurologic  deficit.   The CT scan done today shows the aortic repair to be intact, but there  is no aneurysmal dilatation of the arch or descending thoracic aorta.   PLAN:  The patient will continue his current antihypertensive  medications including Aldactone, hydralazine, and Coreg.  I will plan on  seeing him back in 1 year with a CT angiogram of the thoracic aorta.  He  is clear to do almost any sort of activity except for heavy isotonic  lifting.   Kerin Perna, M.D.  Electronically Signed   PV/MEDQ  D:  09/08/2010  T:  09/09/2010  Job:  [id number]   cc:   Bevelyn Buckles. Bensimhon, MD  Reuben Likes, M.D.
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
## 2011-05-09 NOTE — Telephone Encounter (Signed)
See phone note
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
## 2011-05-09 NOTE — Op Note (Signed)
NAME:  CRISTOFHER, LIVECCHI NO.:  [id number]   MEDICAL RECORD NO.:  [id number]          PATIENT TYPE:  INP   LOCATION:  2316                         FACILITY:  [facility]   PHYSICIAN:  Kerin Perna, M.D.  DATE OF BIRTH:  1961/04/08   DATE OF PROCEDURE:  06/21/2009  DATE OF DISCHARGE:                               OPERATIVE REPORT   OPERATION:  Emergency repair of type A ascending aortic dissection with  reconstruction of the ascending aorta with a 28 Hemashield graft, redo  sternotomy with reconstruction - revision of previously placed saphenous  vein aortocoronary bypass grafts, use of deep hypothermic circulatory  arrest with cerebral perfusion.   SURGEON:  Kerin Perna, MD   ASSISTANT:  Coral Ceo, PA-C   ANESTHESIA:  General.   PREOPERATIVE DIAGNOSIS:  Acute type A aortic dissection, status post  prior coronary artery bypass graft x6 in 2006.   POSTOPERATIVE DIAGNOSIS:  Acute type A aortic dissection, status post  prior coronary artery bypass graft x6 in 2006.   PRESENT ILLNESS:  [name] is a 50 year old African American male  with a history of hypertension and coronary artery disease who had  previous multivessel bypass grafting by Dr. Laneta Simmers several years  previously.  He had sudden onset of chest pain and a CT scan showed a  type 1 dissection with extension down to the iliacs.  There is  significant AI or pericardial effusion by echo and a cardiac cath showed  3 patent vein grafts off the ascending aorta with compromise of the  native lumen of the left main and the right coronary ostium by the false  lumen.  He was felt to be a candidate for emergency repair of his type 1  dissection with reconstruction and revision of his previously placed  bypass grafts.   Prior to surgery, I examined the patient in the cath lab after reviewing  the situation with his cardiologist and also discussed the situation  with his girlfriend - partner.  I discussed  with Mr. Bounds the basic  issues of the surgery, which were to replace the ascending aorta to  prevent bleeding or sudden death.  I reviewed the location of the  surgical incisions and the expected postoperative course and also the  risks of neurologic problems, bleeding, blood transfusion requirement,  MI, and death.  The patient did have a history of ischemic  cardiomyopathy with significant dilatation of his LV chamber and an EF  of 30-35%.   OPERATIVE FINDINGS:  1. Type A ascending aortic dissection with the intimal tear located on      the greater curvature of the aorta at the level of the sinotubular      junction.  2. Severe coronary artery disease, status post prior multivessel      coronary bypass surgery.  3. Postoperative coagulopathy with thrombocytopenia requiring multiple      platelet transfusions.   PROCEDURE:  The patient was brought directly to the OR from the cath lab  and general anesthesia was induced under invasive hemodynamic  monitoring.  A transesophageal 2-D echo probe was placed by the  anesthesiologist which showed mild aortic insufficiency and EF of 30-  40%.  The patient was prepped and draped as a sterile field.  The  saphenous vein was harvested endoscopically from the left leg for a use  in reconstructing the previously placed bypass grafts.  The initial  exposure of the right axillary artery via a right infraclavicular  incision was performed, but the axillary artery was felt to be too small  to provide adequate perfusion, so this incision was closed and a right  femoral artery incision was made and the right femoral artery was  exposed and surrounded with vessel loops.  At this point, the redo  sternotomy was performed and the oscillating saw was used to enter the  mediastinum.  The sternal edges were carefully dissected from the very  dense adhesions in the anterior mediastinum.  Unfortunately, the  saphenous vein graft to the right coronary  was directly under the  sternum and adherent to the sternum.  There was some bleeding from this  graft which was repaired with 6-0 Prolene sutures.  The vein had a good  flow and a good pulse after the repair.  Dissection of the anterior  mediastinum was continued, although very dense adhesions were  encountered.  The anterior surface of the right ventricle, right atrium,  and ascending aorta and the vein grafts were performed.  The vein grafts  had been previously placed to the right coronary with a sequential  graft, and a graft had been placed to the diagonal and a vein graft had  been placed to the circumflex marginals with a sequential vein graft.  Next, the sternal-elevating retractor was used to dissect the area of  the left mammary pedicle which was identified and protected and  surrounded within a vessel loop for later occlusion with a vascular  bulldog while the crossclamp was placed.  The sternal retractor was then  placed at this point and further dissection of the ascending aorta and  the vein grafts was performed.  At this point, the patient was  heparinized and a 20-French cannula was placed in the right femoral  artery.  A 2-stage venous cannula was placed in the right atrium and the  patient was placed on cardiopulmonary bypass and cooled slowly down to  18-20 degrees.  A cardioplegic catheter was placed for retrograde  coronary sinus cardioplegia and a retrograde cerebral catheter was  placed into the SVC through a pursestring.  The aorta was dissected from  the surrounding adhesions and after the epicardial fat from the outflow  tract were dissected within aorta, a bluish hematoma could be noted very  proximally just at the level of the sinotubular junction.  The remainder  of the aorta was very scarred from the previous CABG.  The patient then  had a crossclamp applied and a liter of cold blood cardioplegia was  delivered with a retrograde coronary sinus catheter.  The  aorta was  opened and the vein grafts were detached off the native aorta.  The  false lumen and the intimal tear were identified and the aorta was  trimmed back to the sinotubular junction and just beneath the crossclamp  distally.  While the patient finished cooling to 18 degrees, the  crossclamp remained in place and the proximal anastomosis of the  ascending aortic graft was then performed.  First glue and Teflon was  used to reappose the intimal layer to the adventitia at the level of the  sinotubular junction.  This  was then measured to a 28-mm graft which was  chosen with a side-arm for later antegrade perfusion.  The proximal  anastomosis was performed with a running 3-0 Prolene followed with  interrupted 4-0 pledgeted sutures around the entire anastomosis first  within the aortic anastomosis on the posterior wall and then on the  outside of the aortic anastomosis more anteriorly.  At this point, the  patient was cooled to 18 degrees and the anesthesiologist administered  high-dose steroids and barbiturate therapy and the head was packed in  ice.  The patient was then drained of blood into the pump and the  crossclamp was removed.  The aortic false lumen extended into the  proximal arch and the aorta was divided in this area of the proximal  arch just at the level of the innominate artery.  There was separation  of the intima from the media and this again was initially reconstructed  with Teflon felt circumferentially in the space and with biologic glue  to reappose the adventitia to the intima.  After this had been  completed, the graft was trimmed to the appropriate length using the  side arm as the anterior landmark just below the anastomosis.  Again, a  running 3-0 Prolene suture was used.  It should be noted during the  period of circulatory arrest, the patient received retrograde cerebral  perfusion at a 150 mL per minute through a catheter in the SVC directed  superiorly.   The running 3-0 anastomosis on the outflow suture line was  reinforced with interrupted 4-0 pledgeted sutures within the aorta on  the posterior aspect of the anastomosis and again on the outside of the  aorta more anteriorly.  At this point, the perfusion was resumed after a  circulatory arrest period of between 38 and 40 minutes.  The patient was  placed in a deep Trendelenburg position and air was vented out of the  side arm of the graft which was then clamped.  Full flow was then slowly  reestablished and warming was initiated 5 minutes after reinitiating  blood flow.  A crossclamp was placed on the graft just below the  anastomosis while the vein grafts that had been previously detached were  re-anastomosed to the ascending graft.  Two of the vein grafts (right  coronary and circumflex) required additional placement of the  interposition vein grafts to allow adequate length to reach the new  aorta (Dacron graft).  These vein-to-vein extensions were made with  running 7-0 Prolene.  The vein to Dacron anastomosis was performed with  running 6-0 Prolene using the electrocautery unit to create an opening.  After the 3 proximal anastomoses were performed, the air was vented from  the coronaries with a dose of retrograde warm blood cardioplegia and the  crossclamp was removed.  The suture lines were intact.  The veins were  aspirated of air with a 27-gauge needle and each had good flow as the  vein grafts were opened.  The mammary artery had been clamped with a  bulldog during the period of circulatory arrest and aortic clamping and  this bulldog was removed at this time.  The heart resumed a spontaneous  rhythm and temporary pacing wires were applied.  A left ventricular vent  had been placed earlier and this was then removed.  A McGoon needle was  also placed in the graft after the clamp was removed to collect any  residual air from the heart.  The patient was rewarmed.  During this   period time, we initiated antegrade perfusion to the side arm of the  graft so the patient received antegrade flow during reinitiation of full  flow and during this period of time, the cannula was removed from the  right femoral artery and the artery was repaired with a running 5-0  Prolene.   Once the patient had been adequately rewarmed and reperfused, he was  weaned from bypass on low-dose dopamine in a normal sinus rhythm, not  requiring pacing.  Blood pressure and cardiac output were stable and  protamine was administered without adverse reaction.  There was,  however, diffuse coagulopathy.  The patient received platelets for a  platelet count of 70,000 at the end of the bypass.  He also received FFP  with the platelets improving his coagulation function.  The mediastinum  was irrigated.  The superior pericardial fat was closed over the aorta.  Two mediastinal and a right pleural chest tube were placed and brought  out through separate incisions.  The right groin incision was closed and  the leg incision on the left where the saphenous vein harvested  endoscopically was also closed.  The sternum was closed with interrupted  steel wire and the pectoralis fascia was closed in running #1 Vicryl.  Subcutaneous and skin layers were closed in running Vicryl and sterile  dressings were applied.  Total crossclamp time was 120 minutes with a  circulatory arrest time of 38-40 minutes.  The patient returned to the  ICU in a hemodynamically stable condition.      Kerin Perna, M.D.  Electronically Signed     PV/MEDQ  D:  06/22/2009  T:  06/23/2009  Job:  [id number]   cc:   Bevelyn Buckles. Bensimhon, MD  [facility]
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
## 2011-05-09 NOTE — Telephone Encounter (Signed)
Pt rtn call to Carol °

## 2011-05-09 NOTE — Discharge Summary (Signed)
NAME:  Chad [name], SCHAUER NO.:  [id number]   MEDICAL RECORD NO.:  [id number]          PATIENT TYPE:  INP   LOCATION:  2008                         FACILITY:  [facility]   PHYSICIAN:  Bevelyn Buckles. Bensimhon, MDDATE OF BIRTH:  May 27, 1961   DATE OF ADMISSION:  12/18/2007  DATE OF DISCHARGE:                               DISCHARGE SUMMARY   PRIMARY CARE PHYSICIAN:  Chad [name], M.D.   CARDIOLOGIST:  Bevelyn Buckles. Bensimhon, MD.   REASON FOR ADMISSION:  Chest pain, palpitations.   DISCHARGE DIAGNOSES:  1. Palpitations.      a.     Likely secondary to symptomatic PVCs.  2. Atypical chest pain.      a.     Etiology unclear.      b.     Myocardial infarction ruled out.  3. Coronary disease, status post coronary artery bypass graft in 2006      after non-ST-elevation myocardial infarction.      a.     Last catheterization in May 2008 with severe native-vessel       coronary artery disease, patent vein graft to acute marginal,       posterior descending artery and posterolateral branch of the right       coronary artery, patent vein graft to the ramus and marginal       branch of the circumflex, patent vein graft to the diagonal branch       of the LAD, and an atretic but patent left internal mammary artery       to the MD left anterior descending artery.  4. Ischemic cardiomyopathy with an ejection fraction of 35-40%.      a.     Echocardiogram May 2009 revealing an ejection fraction of 40-       45%.  5. History of asthma.  6. History of pleuritic chest pain.  7. Hypertension.  8. Hyperlipidemia.  9. History of renal insufficiency.  10.History of back surgery.  11.History of bilateral shoulder surgery.  12.History of testicular torsion as a child.  13.History of pancreatitis.   ADMISSION HISTORY:  Chad [name] is a 50 year old male patient followed  by Dr. Gala [name], who presented with complaints of a 2-week history of  fatigue and intermittent pressure on his heart.   He also complained of  occasional episodes of feeling like he was breathing too much.  He also  noted blood rushing through his heart.  His chest pain was nonexertional  or positional and his chest was not tender.  He was admitted for  observation.   HOSPITAL COURSE:  The patient was noted to have sinus rhythm with  premature ventricular contractions on telemetry.  His cardiac enzymes  were negative x3 and he ruled out for myocardial infarction.  His TSH  was normal at 0.766.  his BNP was only mildly elevated at 292.  He was  interviewed and examined by Dr. Gala [name] on the date of discharge.  It  was felt that his heart swooshing was probable palpitations secondary to  symptomatic PVCs.  No further workup was recommended for his atypical  chest  pain.  The patient requested an outpatient echo, and this will be  performed next week.  He will be set up for follow-up with Dr. Gala [name]  as well.  He has been asked to decrease his caffeine intake.   LABS AND ANCILLARY DATA:  White count 4500, hemoglobin 15.5, hematocrit  44.8, platelet count 135,000.  D-dimer less than 0.22.  Sodium 140,  potassium 3.7, glucose 96, BUN 11, creatinine 1.48.  LFTs okay except  for a total bilirubin of 1.3.  Total protein 6.9, albumin 4, calcium  9.2.  Magnesium 2.4.  Cardiac markers, BNP, TSH all as noted above.  Digoxin level 0.2.  Chest x-ray on admission reveals stable examination  with mild cardiac enlargement post CABG, no acute findings.   DISCHARGE MEDICATIONS:  There have been no changes.  He is on:   1. Aldactone 25 mg daily.  2. Digoxin 0.25 mg daily.  3. Potassium 20 mEq two tablets daily.  4. Pravachol 20 mg daily.  5. Furosemide 80 mg daily.  6. Coreg 12.5 mg b.i.d.  7. Lisinopril/hydrochlorothiazide 20/12.5 mg two tablets daily.  8. Aspirin 325 mg daily.   He should return to work on or after December 23, 2007.   DIET:  Low-fat, low-sodium.   WOUND CARE:  Not applicable.   ACTIVITY:   He is to increase his activity slowly.   FOLLOW-UP:  1. He will have an echocardiogram next week and the office will      contact him with an appointment.  2. He will follow up Dr. Gala [name] in the next 2-4 weeks and the      office will contact him with an appointment.  3. He should follow up with Dr. Ladona [name] as directed.   TOTAL PHYSICIAN AND PA TIME:  Greater 30 minutes.      Tereso Newcomer, PA-C      Bevelyn Buckles. Bensimhon, MD  Electronically Signed   SW/MEDQ  D:  12/19/2007  T:  12/19/2007  Job:  [id number]   cc:   Chad [name], M.D.

## 2011-05-09 NOTE — H&P (Signed)
NAME:  Chad, [name] NO.:  [id number]   MEDICAL RECORD NO.:  [id number]          PATIENT TYPE:  OBV   LOCATION:  4738                         FACILITY:  [facility]   PHYSICIAN:  Noralyn Pick. Eden Emms, MD, FACCDATE OF BIRTH:  [birth year]   DATE OF ADMISSION:  06/30/2008  DATE OF DISCHARGE:                              HISTORY & PHYSICAL   PRIMARY CARE PHYSICIAN:  Reuben Likes, MD   PRIMARY CARDIOLOGIST:  Bevelyn Buckles. Bensimhon, MD   CHIEF COMPLAINTS:  Chest pain.   HISTORY OF PRESENT ILLNESS:  Chad [name] is a 50 year old male with a  history of coronary artery disease.  He complains of left-sided chest  pain that he describes as electrical.  He also uses words like  stabbing and shocking.  He states the pain started this morning and he  has had more than 20 episodes today.  The pain lasts from seconds to  minutes.  It lasted long enough and was severe enough for him to take  sublingual nitroglycerin 3 separate times, which he feels helped the  pain.  It also gave him headache.  He has had this pain in the past but  has no recent episodes.  He was evaluated in the office in June 2009,  for chest pain, but he states that chest pain with a heaviness.  He has  had the heaviness type of chest pain as well as the electrical type of  chest pain before.  The chest pain today did not change with deep  inspiration.  It was not associated with shortness of breath, nausea,  vomiting, or diaphoresis.  Currently in the emergency room, he is  resting comfortably.   PAST MEDICAL HISTORY:  1. Status post aortocoronary bypass surgery in 2006 with LIMA to LAD,      SVG to diagonal, SVG to OM1 and OM2, SVG to distal RCA, PDA, and      PL.  2. Non-ST segment elevation MI in 2006, prior to bypass.  3. Most recent cardiac catheterization in 2008, showing LAD 60%,      circumflex okay, OM totaled, distal RCA 90%, PL 80%, all grafts      patent, distal RCA disease treated medically.  4.  Ischemic cardiomyopathy with an EF of 35% by echocardiogram with      moderate MR, proximal isovelocity surface area MR volume 37 mL and      no effusion.  5. Hypertension.  6. Hyperlipidemia.  7. Status post Guidant ICD.  8. History of pleuritic chest pain.  9. Remote history of asthma.  10.Remote history of kidney disease with BUN and creatinine today,      23/1.8.  11.Remote history of pancreatitis.   SURGICAL HISTORY:  He is status post cardiac catheterization x2, bypass  surgery, ICD insertion, back surgery x2, bilateral shoulder surgery, and  remote history of testicular surgery.   ALLERGIES:  He is allergic or intolerant IV DYE, IODINE, SHRIMP, and  SULFA.   CURRENT MEDICATIONS:  1. Lisinopril and hydrochlorothiazide 20/12.5 mg daily.  2. Aspirin 325 mg daily.  3. Coreg 12.5 mg b.i.d.  4.  Potassium 20 mEq 2 tablets daily.  5. Pravachol 40 mg a day.  6. Lasix 80 mg a day.  7. Digoxin 0.25 mg daily.  8. Sublingual nitroglycerin p.r.n.   SOCIAL HISTORY:  [name] with 5 of his children.  He is  self employed doing Education officer, community.  He denies any history of alcohol,  tobacco, or drug abuse.   FAMILY HISTORY:  His mother is alive at age 48 with no history of heart  disease.  His father died at age 12 of an MI.  He has some siblings with  coronary artery disease.   REVIEW OF SYSTEMS:  Chad [name] stated that he had chills for  approximately 5 days, which have resolved.  He does not feel that his  weight has changed recently, but he has not been checking it.  The chest  pain is described above.  He has not had any recent new dyspnea on  exertion, orthopnea, PND, edema, or palpitations.  There has been no  presyncope, claudication, coughing, or wheezing.  He has had no urinary  symptoms.  He has occasional arthralgias.  He denies reflux symptoms.  Full 14-point review of systems is otherwise negative.   PHYSICAL EXAMINATION:  VITAL SIGNS:  Temperature is  98.3, blood pressure  137/83, pulse 57, respiratory rate 20, and O2 saturation 100% on 2 L.  GENERAL:  He is a well-developed [place] male, in no acute  distress.  HEENT:  Normal.  NECK:  There is no lymphadenopathy, thyromegaly, or bruit noted.  There  is minimal JVD.  CV:  His heart is regular in rate and rhythm with an S1 and an S2 and no  significant murmur, rub, or gallop is noted.  Distal pulses are intact  in all 4 extremities.  LUNGS:  Essentially clear to auscultation bilaterally.  ABDOMEN:  Soft and nontender with active bowel sounds.  EXTREMITIES:  There is no cyanosis, clubbing, or edema noted.  MUSCULOSKELETAL:  There is no joint deformity or effusions.  NEUROLOGIC:  He is alert and oriented.  Cranial nerves II-XII grossly  intact.   LABORATORY DATA:  Chest x-ray is pending.   EKG is sinus rhythm, rate 73 with left axis deviation and inverted T-  waves in the lateral leads that is unchanged from an EKG dated February  2009.   LABORATORY VALUES:  Urinalysis is negative.  INR 0.9 and PTT 33.  Point-  of-care markers initially negative.  Sodium 141, potassium 3.9, chloride  106, BUN 23, creatinine 1.8, glucose 108, hemoglobin 16, hematocrit 47,  WBCs 3.6, and platelets 147.   IMPRESSION:  Chest pain.  Chad [name] was seen and evaluated by Dr.  Eden Emms.  He has atypical pain and had patent grafts about a year ago.  His recent EF was 35% which is unchanged and in fact a little improved  from  previous measurements.  He will be admitted and we will rule out  myocardial infarction.  The emergency room physician had started on  heparin and we will continue this.  If his cardiac enzymes remain  negative, consider an outpatient Myoview in our office.      Chad Demark, PA-C      Noralyn Pick. Eden Emms, MD, [facility]  Electronically Signed    RB/MEDQ  D:  06/30/2008  T:  07/01/2008  Job:  [id number]   cc:   Reuben Likes, M.D.  Bevelyn Buckles. Bensimhon, MD  Noralyn Pick.  Eden Emms, MD, [facility]

## 2011-05-09 NOTE — Discharge Summary (Signed)
NAME:  JASHUN, PUERTAS NO.:  [id number]   MEDICAL RECORD NO.:  [id number]          PATIENT TYPE:  OBV   LOCATION:  4738                         FACILITY:  [facility]   PHYSICIAN:  Veverly Fells. Excell Seltzer, MD  DATE OF BIRTH:  February 03, 1961   DATE OF ADMISSION:  06/30/2008  DATE OF DISCHARGE:  07/01/2008                               DISCHARGE SUMMARY   PROCEDURES:  None.   PRIMARY/FINAL DISCHARGE DIAGNOSIS:  Chest pain.   SECONDARY DIAGNOSES:  1. Status post aortocoronary bypass surgery in 2006, with left      internal mammary artery to left anterior descending, saphenous vein      graft to first diagonal branch, saphenous vein graft to first      obtuse marginal artery - second obtuse marginal artery, saphenous      vein graft to right coronary artery - posterior descending artery -      posterolateral artery.  2. Non-ST segment elevation myocardial infarction in 2006, prior to      bypass.  3. Status post cardiac catheterization in 2008, showing all grafts      patent, distal right coronary artery disease, treated medically.  4. Ischemic cardiomyopathy with an ejection fraction of 35% by      echocardiogram in June 2009.  5. Hypertension.  6. Hyperlipidemia.  7. Status post Guidant implantable cardioverter-defibrillator.  8. History of pleuritic chest pain.  9. Remote history of asthma.  10.Remote history of kidney disease with a BUN and creatinine on      admission 21/1.55 with a GFR of 59.  11.Remote history of pancreatitis.  12.Status post back surgery x2 and shoulder surgery.  13.Allergy or intolerance to IV DYE and SULFA.  14.Family history of coronary artery disease.   TIME AT DISCHARGE:  45 minutes.   HOSPITAL COURSE:  Mr. Schmid is a 50 year old male with known  coronary artery disease.  He had upper left side pain that he described  as stabbing and shocking.  He had greater than 20 episodes yesterday and  came to the hospital when it did not resolve.  He  was admitted  overnight.   His cardiac enzymes were negative for MI.  His symptoms resolved without  further intervention.  His labs were within normal limits except for a  white count that was minimally low at 3.6, absolute neutrophils  minimally low at 1.4 and other aspects of the diff within normal limits,  platelet count minimally decreased at 147.  BNP was 43.  A urine drug  screen as well as alcohol screen were both negative.  Urinalysis was  negative as well.  The patient requested a magnesium level be checked,  and this will be added to the blood in the lab if possible.   On July 01, 2008, Mr. Saine was ambulating without chest pain or  shortness of breath.  Dr. Excell Seltzer felt he could be safely discharged home  with outpatient stress testing.   DISCHARGE INSTRUCTIONS:  1. His activity level is to be as tolerated.  2. He is to stick to a low-sodium, heart-healthy diet.  3. He is  to give stress test on July 14, 2008, at 8 a.m. with no food      or drink after midnight before, no caffeine or decaf products 24      hours before, and hold his medications until stress test.  4. He is to follow up with Dr. Gala Romney on July 29, 2008, at 8:15      and with Dr. Lorenz Coaster as needed.   DISCHARGE MEDICATIONS:  1. Pravastatin 40 mg a day.  2. K-Dur 20 mEq recommended daily (takes p.r.n.) with 2 tablets daily      on days he takes Lasix.  3. Digoxin 0.25 mg daily, dig level 0.9 this admission.  4. Coreg 12.5 mg b.i.d.  5. Aspirin 325 mg daily.  6. Lasix 80 mg daily p.r.n.  7. Lisinopril and hydrochlorothiazide 20/12.5, 2 tablets a.m. and 1      tablet p.m.  8. Spirolactone 25 mg daily.      Theodore Demark, PA-C      Veverly Fells. Excell Seltzer, MD  Electronically Signed    RB/MEDQ  D:  07/01/2008  T:  07/02/2008  Job:  [id number]   cc:   Reuben Likes, M.D.

## 2011-05-09 NOTE — Consult Note (Signed)
NAME:  XSAVIER, SEELEY NO.:  [id number]   MEDICAL RECORD NO.:  [id number]          PATIENT TYPE:  INP   LOCATION:  2015                         FACILITY:  [facility]   PHYSICIAN:  Chad Post, MD    DATE OF BIRTH:  1961-08-04   DATE OF CONSULTATION:  06/27/2009  DATE OF DISCHARGE:                                 CONSULTATION   REQUESTING PHYSICIAN:  Kerin Perna, MD   CHIEF COMPLAINT:  Right great toe pain.   HISTORY:  [name] is a 50 year old gentleman who has a  history of gout who had an aortic dissection and surgery approximately 1  week ago performed by Dr. Donata Clay.  Over the course of the past 6  days, he has had increasing swelling and pain located directly over the  right first metatarsophalangeal joint.  This is excruciating and he has  been unable to bear weight.  He says that the pain in his foot is much  more than the pain in his chest has ever been.  He says he has 10/10  pain located directly over the right first metatarsophalangeal joint and  describes it as sharp, as if there is raw concrete in that area, the  granular type of concrete.  This is actually remarkably good description  of gout.  In the past when he had gout, he drank extra cranberry juice,  and this relieved his pain, but it was never quite as bad as this.  He  has not been on gout medications as an outpatient.   PAST MEDICAL HISTORY:  1. Ischemic cardiomyopathy.  2. CABG.  3. Myocardial infarction.  4. Gout.  5. Hypertension.  6. Hyperlipidemia.  7. Low blood pressure.   FAMILY HISTORY:  His brother has gout and his father died of a heart  attack at age 30.   SOCIAL HISTORY:  He is a nonsmoker and is unemployed and lives with his  daughter.   REVIEW OF SYSTEMS:  Complete 14-point review of systems was performed  and was positive for recent chest pain, as well as sciatica, and the  musculoskeletal complaints as above.  All other systems were negative.   PHYSICAL EXAMINATION:  CONSTITUTIONAL:  He is alert and oriented x3, in  no acute distress.  He is well developed, well nourished.  EYES:  Extraocular movements are intact.  LYMPHATICS:  I do not appreciate any axillary or cervical  lymphadenopathy.  CARDIOVASCULAR:  He does not have significant pedal edema.  RESPIRATORY:  He has no cyanosis and no increased use of accessory  musculature.  PSYCHIATRIC:  His judgment and insight are intact, and he is appropriate  through the course of our interaction.  SKIN:  His right great toe is mildly warm, but there are no skin  lesions.  NEUROLOGIC:  Sensation is intact throughout the right foot.  MUSCULOSKELETAL:  He has exquisite pain with passive range of motion of  the first metatarsophalangeal joint.  This is consistent with a  diagnosis of gout.   He has a laboratory value of white blood cell count of 10,000.  IMPRESSION:  1. Right great toe gout flare.  2. Recent aortic dissection.  3. Renal insufficiency with a creatinine of 1.7.   PLAN:  He is going to be on anti-inflammatories as well as colchicine as  per Dr. Donata Clay.  He is not a candidate at the current time for  indomethacin or for methylprednisolone IV due to his other medical  issues.  I have also discussed the options of a joint injection, which  he would like to try.  I would also recommend a hard-sole shoe.  I will  plan to follow up with him tomorrow and make sure that he is improving.  We will perform joint injection today.      Chad Post, MD  Electronically Signed     JPL/MEDQ  D:  06/27/2009  T:  06/28/2009  Job:  [id number]   cc:   Chad [name], M.D.

## 2011-05-09 NOTE — Assessment & Plan Note (Signed)
[facility]                            CARDIOLOGY OFFICE NOTE   Chad [name], Chad [name]                     MRN:          [id number]  DATE:01/14/2008                            DOB:          Dec 13, 1961    PRIMARY CARE PHYSICIAN:  Reuben Likes, M.D.   INTERVAL HISTORY:  Chad [name] is a 50 year old male with a history of  coronary artery disease, status post previous myocardial infarction and  bypass surgery in 2006.  Also, has congestive heart failure secondary to  ischemic cardiomyopathy, previous ejection fraction about 40%.  He is  status post single chamber defibrillator.  He also has a history of  hypertension, hyperlipidemia, renal insufficiency, and medical  noncompliance.   He returns today for routine follow-up.  He was recently admitted in  December with atypical chest pain, which he describes swooshing in his  heart.  He ruled out for myocardial infarction.  It is thought that his  symptoms were due to PVCs.  However, he did receive a follow-up  echocardiogram which shows progressive dilatation of his LV, with a LV  end-diastolic diameter of now 68 mm, which was previously around 55.  Ejection fraction has dropped from 40% to 45% to 25% to 30%.   He says he feels more fatigued.  He has been taking his Lasix every day,  instead of p.r.n.  He denies any orthopnea or PND currently. No chest  pain.  He continues to have the swooshing sounds in his heart.  He has  tried to be more compliant with medications.  He has been checking his  blood pressure home and often runs in the 140/90 or 100 range.   CURRENT MEDICATIONS:  1. Lisinopril/hydrochlorothiazide 40/25 a day.  2. Digoxin 0.25 a day.  3. Aldactone 25 a day.  4. Aspirin 325 daily.  5. Coreg 12.5 b.i.d.  6. Pravachol 20 a day.   MEDICATIONS:  1. Potassium 20 day.  2. Lasix 80 a day.   PHYSICAL EXAMINATION:  GENERAL:  He is well-appearing, in no acute  distress, ambulates  around the clinic without any respiratory  difficulty.  VITAL SIGNS:  Blood pressure is 142/110, heart rate 57.  Weight is 218.  HEENT:  Normal.  NECK:  Supple.  No JVD.  Carotids are 2+ bilateral bruits.  There is no  lymphadenopathy or thyromegaly.  CARDIAC:  His PMI is laterally displaced.  He has a regular rate and  rhythm with an S4, no murmur.  No S3.  LUNGS:  Clear.  ABDOMEN:  Soft, nontender, nondistended, no hepatosplenomegaly, no  bruits, no masses.  Good bowel sounds.  EXTREMITIES:  Warm with no cyanosis, clubbing or edema.  No rash.  NEURO:  He is alert and oriented x3.  Cranial nerves II-XII are grossly  intact, moves all four extremities without difficulty.   ASSESSMENT/PLAN:  1. Congestive heart failure, secondary to ischemic cardiomyopathy.  It      appears that his LV function is getting worse, and he is having      negative remodeling.  It was very clear to him that I  thought this      was likely related to his blood pressure and medication      noncompliance and told him that he has to be very compliant with      all his medications, and we need to get his blood pressure down.      We reviewed his medications in detail.  I will see him back on a      q.2-week basis, until we can get him back on track.  I have asked      him to check his blood pressure and weights every day and sometimes      twice a day and keep a log for me.  I suspect that next visit will      have go up on his Coreg or can also consider adding back Norvasc.  2. Hypertension, as above.   DISPOSITION:  We will see him back in 2 weeks, will check his lipids, a  BNP and a BMET today.     Bevelyn Buckles. Bensimhon, MD  Electronically Signed    DRB/MedQ  DD: 01/14/2008  DT: 01/14/2008  Job #: [id number]   cc:   Reuben Likes, M.D.

## 2011-05-09 NOTE — H&P (Signed)
NAME:  Chad [name], Chad [name] NO.:  [id number]   MEDICAL RECORD NO.:  [id number]          PATIENT TYPE:  INP   LOCATION:  1825                         FACILITY:  [facility]   PHYSICIAN:  Rollene Rotunda, MD, FACCDATE OF BIRTH:  12/10/1961   DATE OF ADMISSION:  07/17/2009  DATE OF DISCHARGE:                              HISTORY & PHYSICAL   PRIMARY:  None.   CARDIOLOGIST:  Dr. Gala Romney.   REASON FOR PRESENTATION:  Evaluate patient with chest pain.   HISTORY OF PRESENT ILLNESS:  The patient is 50 years old.  He is well-  known to Dr. Gala Romney.  He has a history of bypass surgery.  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 Donata Clay.  He had reimplantation of his previous  bypass grafts and repair of the Hemashield graft.  He was most recently  in the hospital earlier this month with atypical chest pain.  It was  felt essentially to be musculoskeletal.  No acute therapy was indicated.  He was noted to be anemic.  This was followed as there was no evidence  of bleeding.   At home, he has advanced nurses coming.  They dress a small persistent  wound in his chest.  However, he has continued to have chest discomfort.  He is mostly limited by right leg pain related to sciatica.  However,  this evening he developed some chest comfort that was under his left  breast.  He also noted his blood pressure be 190.  He became quite  anxious and concerned about this.  He did not describe cough, nausea,  vomiting or chills.  He did not have shortness of breath.  He did not  have radiation of the discomfort to his jaw or to his arms.  It was not  like previous dissection pain or anginal pain.  In the emergency room,  chest x-ray was nonacute.  EKG demonstrated chronic inferolateral T-wave  inversions described on previous EKGs.  Cardiac markers were negative.  However, he is quite anxious and distraught with this pain.   PAST MEDICAL  HISTORY:  1. Coronary artery disease, status post CABG (see below).  2. Gout.  3. Hypertension.  4. Hyperlipidemia.  5. Asthma.  6. Anemia.  7. Pancreatitis.  8. Mild chronic renal insufficiency.  9. Nonischemic cardiomyopathy (last ejection fraction 30% on July 01, 2009).  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   PAST SURGICAL HISTORY:  1. CABG (seven bypass grafts by Dr. Laneta Simmers, June 29, 2005.  LIMA to      the LAD, SVG to diagonal, SVG to OM1 and OM2, SVG to acute      marginal, posterolateral 1 and posterolateral 2), acute repair of a      type A dissection (Hemashield graft revision of bypass graft, June 21, 2009, by Dr. Donata Clay).  2. Shoulder surgery.  3. Lumbar surgery.  4. Testicular surgery.   ALLERGIES/INTOLERANCES:  1. SULFA.  2. LATEX.  3. NORVASC.  4. SEAFOOD.   MEDICATIONS:  1. Gabapentin 300 mg b.i.d.  2. Digoxin 0.25 mg daily.  3. Allopurinol 100 mg b.i.d.  4. Hydralazine 50 mg t.i.d.  5. Zocor 40 mg nightly.  6. Aspirin 81 mg daily.  7. Carvedilol 25 mg b.i.d..   SOCIAL HISTORY:  He is self-employed.  He does not smoke cigarettes or  drink alcohol.  He lives in [place].  He has a girlfriend.   FAMILY HISTORY:  Positive for his father dying of an MI at 110.   REVIEW OF SYSTEMS:  As stated in the HPI, positive for persistent  nonproductive cough, leg pain, diffuse joint pains.  Negative for all  other systems.   PHYSICAL EXAMINATION:  GENERAL:  The patient is anxious and  uncomfortable appearing, but in no distress.  VITAL SIGNS:  Blood pressure 143/74, heart rate 62 and regular,  respiratory rate 16, afebrile.  HEENT:  Eyes unremarkable.  Pupils  equal, round and reactive to light.  Fundi not visualized.  Oral mucosa  unremarkable.  NECK:  No jugulovenous distention at 10 cm at 45 degrees.  Carotid  upstroke brisk and symmetric.  No bruits or thyromegaly.  LYMPHATICS:  No cervical,  axillary or inguinal adenopathy.  LUNGS:  Decreased breath sounds, but no wheezing or crackles.  BACK:  No costovertebral mass.  CHEST:  Well-healed sternotomy site, ICD site and right upper chest  scar.  HEART:  PMI not displaced or sustained.  S1-S2 within normal limits.  No  S3, no clicks, no rubs, no murmurs.  ABDOMEN:  Obese.  Positive bowel sounds, normal in frequency and pitch.  No bruits, no rebound, no guarding, no midline pulsatile mass.  No  hepatomegaly or splenomegaly.  SKIN:  No rashes, no nodules.  EXTREMITIES:  2+ pulses throughout.  No cyanosis, no clubbing, no edema.  NEURO:  Oriented to person, place and time.  Cranial nerves II-XII  grossly intact.  Motor grossly intact.   EKG sinus rhythm, rate 83, inferolateral T-wave inversions as described  in previous EKGs.   Chest x-ray:  No acute disease, mild bilateral basilar atelectasis  improving.   LABORATORY DATA:  Digoxin 0.5, WBC 5.4, hemoglobin 8.3, platelets 260,  sodium 142, potassium 3.8, BUN 9, creatinine 1.4.   ASSESSMENT/PLAN:  1. Chest:  The patient's chest comfort again is atypical.  However, he      is quite anxious and uncomfortable.  He is worried about his blood      pressure.  We will put him in the hospital for observation and      continue the previous medications.  He can take p.o. medications      such as Tylenol for pain relief.  I do not see any indication to      cycle cardiac markers.  2. Anemia.  His hemoglobin is slightly down from previous (8.8).      However, I do not suspect this represents an ongoing bleed.  There      is no historical evidence for ongoing bleed.  This can be followed.      3.  Renal insufficiency.  His creatinine does not suggest      significant renal insufficiency at this time.  However, because of      this history, we are avoiding ACE inhibitors.  This can be      followed.  3. Systolic heart failure.  The patient seems to be euvolemic.  He  will continue  on the medications as listed.  4. Gout.  He is on allopurinol.  5. Sciatica.  He says he is going to get this looked at soon and      hopefully this will help as this seems to be very limiting to him.      Rollene Rotunda, MD, [facility]  Electronically Signed     JH/MEDQ  D:  07/17/2009  T:  07/17/2009  Job:  [id number]

## 2011-05-09 NOTE — Assessment & Plan Note (Signed)
[facility]                            CARDIOLOGY OFFICE NOTE   GULED, GAHAN                     MRN:          [id number]  DATE:01/27/2008                            DOB:          Dec 22, 1961    PRIMARY CARE PHYSICIAN:  Dr. Leslee Home.   HISTORY:  Mr. Allman is a 50 year old male with a history of coronary  artery disease status post previous myocardial infarctions and bypass  surgery 2006, also congestive heart failure secondary ischemic  cardiomyopathy with a previous ejection fraction about 40%.  However, he  had a recent echocardiogram which showed decline in his EF to 25-30%  this in the setting of uncontrolled hypertension.  He does have a single  chamber defibrillator in place.  Remainder is medical history is notable  for hyperlipidemia, renal insufficiency, and medical noncompliance.   I saw him about 2 weeks ago and made it very clear to him that it seemed  that his myopathy was getting a bit worse in the and he really needed to  be more compliant with his medical therapy.  He returns today for follow-  up.  He says he has been doing his best to really be on top of his  medications.  He tries to take his Coreg twice every day but sometimes  does not get to the very late at night.  He does feel somewhat better,  though remains somewhat fatigued.  Denies any orthopnea, PND, no lower  extremity edema.  The swishing feeling his heart has felt better.  He  denies chest pain.   CURRENT MEDICATIONS:  Lisinopril HCTZ 40/25, digoxin 0.25, aldactone 25,  aspirin 325, Coreg 12.5 b.i.d., Pravachol 20 a day, potassium 20 a day,  and Lasix 80 a day.  Viagra p.r.n.   ALLERGIES/INTOLERANCES:  SULFA, CONTRAST DYE, SHELLFISH and is  intolerant of NORVASC as it makes him ache.   PHYSICAL EXAM:  He is well-appearing no acute distress.  Blood pressure is 148/98, heart rate 59, weight 216 which is down 2  pounds.  HEENT is normal.  NECK:   Supple.  There is no JVD.  Carotid 2+ bilateral bruits.  There is  no lymphadenopathy or thyromegaly.  CARDIAC:  PMI is laterally displaced.  Regular rate and rhythm with an  S4 no murmur.  No S3.  LUNGS:  Clear.  ABDOMEN:  Soft, nontender, nondistended.  No hepatosplenomegaly, no  bruits, no masses.  Good bowel sounds.  EXTREMITIES:  Warm with no cyanosis, clubbing or edema.  No rash.  NEURO:  He is alert and x3.  Cranial nerves II-XII grossly intact.  Moves all 4 extremities without difficulty.   Labs show a sodium 140, potassium 3.6, creatinine of 1.4.  Total  cholesterol is 212, triglycerides 153, HDL is 41 and LDL is 120.   ASSESSMENT/PLAN:  1. Congestive heart failure.  He remains New [place] Heart Association      and wedge a class II.  Once again had a long talk about the need      for compliance with his medications.  His blood pressure is still  elevated today.  We discussed the options.  He is not willing to      take Norvasc.  Given the fact that he is already on an ACE      inhibitor and Aldactone I am hesitant to place him on an ARB as      well.  I think really only option is to put him on hydralazine.      Both he and I are concerned about his ability to comply with this      regimen.  Given our lack of options, we will try hydralazine 25      b.i.d. instead of t.i.d. Unfortunately, given the fact he takes      Viagra, will be not be able to use nitrates.  He will need a CTX      some time in near future to reassess his functional capacity.  2. Hypertension as above.  3. Hyperlipidemia with increase Pravachol 240.  Goal LDL is less than      70.   DISPOSITION:  Will see him back in one month.     Bevelyn Buckles. Bensimhon, MD  Electronically Signed    DRB/MedQ  DD: 01/27/2008  DT: 01/27/2008  Job #: [id number]   cc:   Reuben Likes, M.D.
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
## 2011-05-12 NOTE — Discharge Summary (Signed)
NAME:  Chad [name], Chad [name] NO.:  [id number]   MEDICAL RECORD NO.:  [id number]          PATIENT TYPE:  INP   LOCATION:  2022                         FACILITY:  [facility]   PHYSICIAN:  Dorian Pod, ACNP  DATE OF BIRTH:  06/10/1961   DATE OF ADMISSION:  08/30/2006  DATE OF DISCHARGE:  09/02/2006                                 DISCHARGE SUMMARY   DISCHARGING DIAGNOSES:  1. Epigastric pain with elevated LFTs with questionable etiology.  2. History of coronary artery disease.  Negative cardiac enzymes this      admission.  EKG without acute ST or T wave changes.  3. Hypertension.  4. Medical noncompliance with patient refusing to take his Lasix during      this hospital admission.   Past medical history includes:  1. Hypertension.  2. Hyperlipidemia.  3. Chronic renal insufficiency.  4. Hypoglycemia.  5. CONTRAST DYE ALLERGY.  6. Severe ischemic cardiomyopathy with an ejection fraction of 25%.  7. Status post placement of a guided ICD by Dr. Graciela Husbands November 24, 2005.  8. Congestive heart failure.  9. Hyperlipidemia.  10.History of asthma.  11.History of back injury secondary to getting hit by a tractor trailer,      status post lumbar surgery.  12.History of coronary artery bypass surgery in July of 2006 with      alignment to the LAD.  Saphenous vein graft to his diagonal branch.      Saphenous vein graft to the obtuse marginal with a jump graft to the      next obtuse marginal, then a saphenous vein graft to the right coronary      artery.  A jump graft from the posterior ascending to the      posterolateral to the acute marginal.   Consultation this admission with Dr. Virginia Rochester for a GI evaluation secondary to  elevated LFTs.   HOSPITAL COURSE:  Mr. Sontag is a 50 year old African-American gentleman  followed by Dr. Gala Romney with heart disease and congestive heart failure  history who presented to [facility] this admission complaining of 1- to 2-  week history  of chest pain and back pain, more severe on the day of  admission.  In the emergency room, patient states nitroglycerin and heparin  helped significantly.  Baseline lab work obtained.  Negative cardiac  enzymes, however, patient found to have elevated liver function with a total  bili of 1.5 and direct bili 1.3, AST of 81, and ALT of 82.  Amylase of 76, a  lipase of 28.  Hepatitic function repeated prior to discharge with an  alkaline phosphatase of 136, AST of 61, ALT of 176.  This patient denies any  history of EtOH abuse.  He is on a statin, Lipitor, which has been  discontinued during this hospital admission.  Dr. Virginia Rochester saw patient in  consultation in September 01, 2006.  CT of the abdomen was done that showed  no acute findings.  Dr. Virginia Rochester felt patient has long-lasting, right-sided  abdominal pain with no history of previous similar pain.  He noted patient  had been constipated.  He  felt LFTs may be elevated secondary to Lipitor.  He felt pain was not typical of any GI process and recommended followup  ultrasound.  Patient continued to remain hypertensive throughout  hospitalization.  Treated with hydralazine p.r.n.  Dr. Virginia Rochester checked on  patient again on the 9th.  Patient stated he felt much better.  Dr. Virginia Rochester felt  patient can proceed with outpatient ultrasound.  After __________ in to see  patient, patient states he is ready to go home.  Patient being discharged  home on Plavix 75 mg daily, Toprol XL 150 mg daily, digoxin 0.25 mg daily,  spironolactone 25 mg daily, aspirin 325 mg daily, K-Dur 40 mEq daily,  Atacand 32 mg daily, Lasix 40 mg daily, Protonix 40 mg daily.  Patient asked  to discontinue his Lipitor, follow up with Dr. Gala Romney in 1 to 2 weeks.  Patient to call for appointment.  Followup with [facility] as a new patient.  This will be arranged through our office.  I have left a message for our  office to call [facility] to schedule patient as a new client.  He is to  continue a  low-fat, low-cholesterol diet.  Activity as tolerated.  At time  of discharge, patient noted still to be hypertensive even after treatment  with hydralazine p.r.n.  His spironolactone will be increased to 50 mg  daily.  Patient to have BMET repeated when he follows up with Dr. Gala Romney  within the next week.  Duration of discharge encounter 30 minutes.           ______________________________  Dorian Pod, ACNP     MB/MEDQ  D:  09/02/2006  T:  09/02/2006  Job:  [id number]   cc:   [facility]

## 2011-05-12 NOTE — Op Note (Signed)
NAME:  Chad [name], Chad [name] NO.:  [id number]   MEDICAL RECORD NO.:  [id number]                   PATIENT TYPE:  INP   LOCATION:  3016                                 FACILITY:  [facility]   PHYSICIAN:  Kathaleen Maser. Pool, M.D.                 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   POSTOPERATIVE DIAGNOSES:  Left L4-5 extraforaminal herniated nucleus  pulposus with radiculopathy.   OPERATION PERFORMED:  Left L4-5 extraforaminal microdiskectomy.   SURGEON:  Kathaleen Maser. Pool, M.D.   ASSISTANT:  Donalee Citrin, M.D.   ANESTHESIA:  General endotracheal.   INDICATIONS FOR PROCEDURE:  The patient is a 50 year old male with history  of back and left lower extremity pain, paresthesias and weakness consistent  with left-sided L4 radiculopathy which has failed conservative management.  MRI scan demonstrates evidence of a large left L4-5 extraforaminal disk  herniation with compression of the exiting left L4 nerve root.  We discussed  options available for management including the possibility of undergoing a  left-sided L4-5 extraforaminal microdiskectomy for hopeful improvement of  his symptoms.   DESCRIPTION OF PROCEDURE:  The patient was taken to the operating room and  placed on the table in the supine position.  After adequate level of  anesthesia was achieved, the patient was positioned prone onto a Wilson  frame and appropriately padded.  The patient's lumbar region was prepped and  draped sterilely.  A 10 blade was used to make a linear skin incision  overlying the L4-5 interspace.  This was carried down sharply in the  midline.  A subperiosteal dissection was then performed exposing the lamina  and facet joints of L4 and L5 as well as the transverse processes of L4.  Deep  self-retaining retractor was placed.  Intraoperative x-ray was taken,  the level was confirmed.  Next, a foraminal approach was then performed  using high speed drill and Kerrison rongeurs to remove the lateral aspect of  the inferior facet of L4 and the superior facet of L5.  The intertransverse  ligament was elevated and dissected in piecemeal fashion using Kerrison  rongeurs.  The underlying left-sided L4 nerve root was identified.  Epidural  fat and venous plexus was coagulated and cut.  Free disk material was then  encountered and dissected free using blunt nerve hooks.  Disk space was  entered.  An aggressive diskectomy was performed.  There was no evidence of  any residual disk herniation.  There was no evidence of injury to the left-  sided L4 nerve root or thecal sac.  A blunt probe was passed easily along  the course of the exiting nerve root.  The wound was then irrigated with  antibiotic solution.  Gelfoam was  placed topically for hemostasis which was  found to be good.  Microscope and retractor system were removed.  Hemostasis  in the muscle was achieved with electrocautery.  The wound was then  closed in layers with Vicryl sutures.  Steri-Strips and sterile dressing  were applied.  There were no apparent complications.  The patient tolerated  the procedure well and returned to the recovery room postoperatively.                                                 Henry A. Pool, M.D.    HAP/MEDQ  D:  03/02/2003  T:  03/03/2003  Job:  [id number]

## 2011-05-12 NOTE — Discharge Summary (Signed)
NAME:  Chad [name], Chad [name] NO.:  [id number]   MEDICAL RECORD NO.:  [id number]          PATIENT TYPE:  INP   LOCATION:  4737                         FACILITY:  [facility]   PHYSICIAN:  Jesse Sans. Wall, M.D.   DATE OF BIRTH:  [birth year]   DATE OF ADMISSION:  07/12/2005  DATE OF DISCHARGE:  07/15/2005                                 DISCHARGE SUMMARY   PRIMARY CARE PHYSICIAN:  Dr. Leslee Home.   PRIMARY CARDIOLOGIST:  Dr. [name].   PRINCIPAL DIAGNOSIS:  Congestive heart failure/ischemic cardiomyopathy,  ejection fraction approximately 25%.   OTHER DIAGNOSES:  1.  Coronary artery disease status post coronary artery bypass grafting x7.  2.  Chronic renal insufficiency.  3.  Mildly elevated hemoglobin A1c 6.0.  4.  SHELLFISH AND IV CONTRAST ALLERGY.  5.  Hypertension.  6.  Hyperlipidemia.  7.  History of back injury secondary to getting hit by a tractor trailer      status post lumbar surgery.  8.  Asthma.   ALLERGIES:  IV CONTRAST.   PROCEDURES:  2D echocardiogram.   HISTORY OF PRESENT ILLNESS:  A 50 year old white male with prior history of  CAD, status post CABG x7 performed by Dr. Laneta Simmers on June 29, 2005, who was  discharged seven days prior to readmission on July 22, 2005 with progressive  dyspnea on exertion, increasing abdominal distention, lower extremity edema,  and weight gain.  The patient was felt to be in congestive heart failure  secondary to ischemic cardiomyopathy and he was admitted for further  evaluation.   HOSPITAL COURSE:  Chad [name] was initiated on IV Lasix therapy with brisk  diuresis and was maintained on his home regimen of beta-blocker and ACE  inhibitor.  He underwent echocardiogram on July 14, 2005 showing an EF of 20-  30% with moderate mitral regurgitation and no pericardial effusion.  He was  switched over from IV to oral Lasix on July 14, 2005 and felt to be  satisfactory for discharge on July 15, 2005.   DISCHARGE LABS:   Hemoglobin 10.7, WBC 5.2, BNP 427 down from 861.2,  potassium 3.6, BUN 13, creatinine 1.3, magnesium 2.0.   DISPOSITION:  The patient is being discharged home today in good condition.   FOLLOWUP PLANS/APPOINTMENTS:  He is to follow-up with Dr. [name] on  July 24, 2005 at 3:30 p.m.   DISCHARGE MEDICATIONS:  1.  Metoprolol 50 mg b.i.d.  2.  Lisinopril 5 mg daily.  3.  Aspirin 325 mg daily.  4.  Lipitor 40 mg q.h.s.  5.  Lasix 40 mg b.i.d.  6.  Potassium chloride 20 mEq b.i.d.   Outstanding lab studies none.  Duration discharge encounter 35 minutes.      Ok Anis, NP      Jesse Sans. Wall, M.D.  Electronically Signed    CRB/MEDQ  D:  10/02/2005  T:  10/02/2005  Job:  [id number]   cc:   Reuben Likes, M.D.  Fax: [phone]   Salvadore Farber, M.D. [facility]  [address]  [address]  [place]  [place] [address]

## 2011-05-12 NOTE — Op Note (Signed)
[facility]  Patient:    Chad [name], Chad [name]                     MRN: [id number] Proc. Date: 02/05/01 Adm. Date:  [id number] Attending:  Donn Pierini                           Operative Report  PREOPERATIVE DIAGNOSIS:  Right L4-5 herniated nucleus pulposus with radiculopathy.  POSTOPERATIVE DIAGNOSIS:  Right L4-5 herniated nucleus pulposus with radiculopathy.  PROCEDURE:  Right L4-5 laminotomy and microdiskectomy.  SURGEON:  Julio Sicks, M.D.  ASSISTANT:  Reinaldo Meeker, M.D.  ANESTHESIA:  General endotracheal.  INDICATIONS:  Chad [name] is a 50 year old male with history of back and right lower extremity pain, paresthesias and weakness because of a right-sided L5 radiculopathy, which has been longstanding and has failed conservative management.  MRI scanning and CT myelography demonstrated broad-based disk herniation at L4-5 with significant right-sided stenosis causing compression of the right-sided L5 nerve root.  Patient was counseled as to his options. He has decided to proceed with a right-sided L4-5 laminotomy and microdiskectomy for hopeful improvement.  DESCRIPTION OF PROCEDURE:  Patient was taken to the operating room, placed on operating table in a supine position.  After adequate level of anesthesia achieved, patient was positioned prone onto a Wilson frame, appropriately padded.  Patients lumbar region was shaved and prepped sterilely.  A #10-blade was used to make a linear skin incision overlying the L4-L5 interspace.  This was carried down sharply in the midline.  A subperiosteal dissection was performed on the right side exposing the lamina and facet joints of L4 and L5.  A deep self-retaining retractor was placed. Intraoperative x-ray was taken and the level was confirmed.   A laminotomy was then performed using a high-speed drill and Kerrison rongeurs to remove the inferior one-third of the lamina of L4, the medial  edge of the L4-L5 facet joint and the superior rim of the L5 lamina.  Ligamentum flavum was then elevated and resected in a piecemeal fashion using Kerrison rongeurs.  The underlying thecal sac and exiting L5 nerve root were identified.  Microscope was brought into the field and used for microdissection of the right-sided L5 nerve root and underlying disk herniation.  The epidural venous plexus was coagulated and cut.  Thecal sac and L5 nerve root was mobilized and retracted towards the midline.  The disk herniation was readily apparent.  This was then incised with a 15-blade in a rectangular fashion.  A wide disk space cleanout was then achieved using pituitary rongeurs, upward-angled pituitary rongeurs, and Epstein curets.  All loose or obviously degenerated disk material was removed from the interspace.  All elements of the disk herniation were completely removed.  A right-sided L5 foraminotomy was then performed using Kerrison rongeurs tracking out the L5 nerve root.  At this point, there was no evidence of any compression of thecal sac or nerve roots.  The wound was then copiously irrigated with antibiotic solution.  Gelfoam was placed topically for hemostasis, which was found to be good.  Microscope and retractor system were removed.  Hemostasis in the muscle was achieved with electrocautery.  The wound was then closed in layers with Vicryl sutures.  Steri-Strips were applied to the surface.  There were no apparent complications.  The patient tolerated the procedure well and he returned to recovery room postoperatively. DD:  02/05/01 TD:  02/05/01  Job: [id number]

## 2011-05-12 NOTE — H&P (Signed)
NAME:  IFEANYICHUKWU, WICKHAM NO.:  [id number]   MEDICAL RECORD NO.:  [id number]          PATIENT TYPE:  INP   LOCATION:  1831                         FACILITY:  [facility]   PHYSICIAN:  Learta Codding, M.D. LHCDATE OF BIRTH:  November 05, 1961   DATE OF ADMISSION:  08/04/2005  DATE OF DISCHARGE:                                HISTORY & PHYSICAL   REASON FOR ADMISSION:  Chest pain with radiation to the back and arm.   HISTORY OF PRESENT ILLNESS:  The patient is a pleasant 50 year old African  American who recently had a seven vessel CABG in July 2006.  The patient has  an ischemic cardiomyopathy with an ejection fraction of approximately 25%.  He also apparently has mild to moderate regurgitation.  The patient states  that he awoke this morning with pain in his chest that radiated to his back  and neck and also down his left arm and affected his two outer fingers.  The  patient states his pain was a 10 out of 10.  The patient was admitted to the  ED with shoulder and arm pain which is approximately 3 out of 10.  The  patient also states that the pain feels very similar to the pain he felt  with his heart attack.  Of note, the patient stopped taking his Lasix and  potassium chloride on his own thinking that he was able to stop that without  any problems as he deemed necessary.   PAST MEDICAL HISTORY:  1.  Coronary artery disease status post seven vessel CABG done by Dr. Laneta Simmers      on June 29, 2005, LIMA to the LAD, sequential SVG to OM and OM2, SVG to      the diagonal, sequential SVG to the acute marginal and the first      posterolateral and second posterolateral.  2.  Asthma.  3.  Ischemic cardiomyopathy with ejection fraction of 20-25% by      catheterization.  4.  Hypertension.  Of note, the patient is currently taking Lisinopril and      has a nonproductive cough.  5.  History of back injury post being hit by a trailer.  6.  An allergy to IV contrast and shell fish.  7.   Mild chronic renal insufficiency.  8.  Status post lumbar surgery.  9.  Status post surgery for testicular retraction.   ALLERGIES:  The patient has an allergy to IV contrast, sulfa, and shell  fish.   MEDICATIONS:  The patient is currently taking aspirin 325 mg daily, Lipitor  40 mg q.h.s., Metoprolol 50 m g b.i.d., Lisinopril 40 mg daily.   SOCIAL HISTORY:  The patient is married and has ten children.  The patient  is currently disabled but is self-employed delivering Teacher, early years/pre.  The patient states he has never smoked, does not drink alcohol, and eats a  heart healthy diet.  The patient also states that he walks.   FAMILY HISTORY:  Significant for coronary artery disease with father  deceased at age 27 due to MI, mother age 41 with coronary artery disease,  and siblings with some heart disease.   REVIEW OF SYSTEMS:  The patient states he has continued chest pain,  shortness of breath, dyspnea on exertion, PND, cough, and wheezing.  The  patient also states that he has some anxiety which he states has lessened in  the last couple of weeks.  The patient also states that he has arthralgias  in his hands.  The patient also states he has some symptoms of abdominal  pain and GERD which he attributes to taking his medications.  The patient  also states that at times he has heat and cold intolerance.   PHYSICAL EXAMINATION:  VITAL SIGNS:  Temperature 98.5, pulse 98, respiratory rate 20, blood  pressure 147/91, saturation 99% on room air.  GENERAL:  He is a pleasant African American male in some mild dyspneic  distress sitting up on the stretcher.  The patient is awake, alert and  oriented x 3.  NECK:  Normal carotid upstrokes, no bruits, 7 cm JVD.  LUNGS:  Respiratory rate regular with rhonchi and crackles heard bilateral  about 2/3 up.  SKIN:  Noted a midline sternal incision healing well, also a left leg  incision healing well.  ABDOMEN:  Soft, round, nontender,  nondistended, bowel sounds present x 4.  EXTREMITIES:  Warm, dry, no clubbing or cyanosis, trace edema.  Dorsalis  pedal  and posterior tibial pulses 2+ and equal bilaterally.   LABORATORY DATA:  Chest x-ray shows cardiomegaly, edema, and effusion.  EKG  shows sinus tach with a rate of 101, some flattened ST segments, but  unchanged from EKG dated July 12, 2005.  Hemoglobin 12.9, hematocrit 38,  sodium 141, potassium 4, chloride 107, BUN 13, glucose 107.  Initial point  of care markers showed CK 1, troponin less than 0.05, BNP is pending.   ASSESSMENT/PLAN:  1.  The patient is significantly volume overloaded, has CHF exacerbation.      It appears that the patient stopped taking Lasix and potassium chloride      on his own.  We will admit and give the patient IV Lasix to diurese.      Cycle cardiac enzymes to rule out MI.  Will add Aldactone 25 mg p.o.      daily to Lasix.  We will also change the patient from Lisinopril to      Cozaar due to his chronic cough.  We will also obtain an echo to rule      out pulmonary effusion and to re-evaluate EF.  2.  Hyperlipidemia.  We will continue the statin.  3.  Hypertension.  We will monitor and may need to increase beta blocker to      control hypertension.  4.  Anxiety.  The patient states anxiety has decreased.  We need to monitor      this and may need to add a mild anti-anxiety agent.  5.  Asthma, questionable inhalers.  We will also obtain a D-dimer to rule      out PE.     ______________________________  April Humphrey, NP      Learta Codding, M.D. [facility]  Electronically Signed    AH/MEDQ  D:  08/04/2005  T:  08/04/2005  Job:  [id number]

## 2011-05-12 NOTE — Cardiovascular Report (Signed)
NAME:  JACOBE, STUDY NO.:  [id number]   MEDICAL RECORD NO.:  [id number]          PATIENT TYPE:  INP   LOCATION:  4743                         FACILITY:  [facility]   PHYSICIAN:  Vida Roller, M.D.   DATE OF BIRTH:  30-Sep-1961   DATE OF PROCEDURE:  08/07/2005  DATE OF DISCHARGE:                              CARDIAC CATHETERIZATION   CARDIOLOGIST:  Dr. Geralynn Rile   PROCEDURES PERFORMED:  1.  Left heart catheterization.  2.  Selective coronary angiography.  3.  Left internal mammary artery bypass angiography.  4.  Saphenous vein graft bypass angiography.   HISTORY OF PRESENT ILLNESS:  Mr. Westfall is a 50 year old man with a  severe dilated cardiomyopathy status post bypass surgery in July of 2006  where he received a LIMA to his LAD, a saphenous vein graft to his diagonal  branch, a saphenous vein graft which goes from one obtuse marginal jump  graft to the next obtuse marginal, then a saphenous vein graft to the right  coronary artery system, a jump graft from the posterior descending to the  posterolateral to the acute marginal.  He presented with heart failure and  elevated troponin concerning for a myocardial infarction.  This was an  assessment for graft patency as well as severe native coronary disease.   DETAILS OF THE PROCEDURE:  After obtaining informed consent the patient was  brought to the cardiac catheterization laboratory in the fasting state.  He  does have a history of dye allergy so he was pre medicated with p.o.  prednisone and p.o. Pepcid.  He received IV Solu-Medrol and IV Benadryl in  the catheterization laboratory.  He also received conscious sedation.  The  patient was prepped and draped in the usual sterile manner and then local  anesthetic was obtained over the right groin using 1% lidocaine without  epinephrine.  The right femoral artery was cannulated using a modified  Seldinger technique with a 6-French 10 cm sheath and left heart  catheterization was performed using a 6-French Judkins left #4 which seated  the left main coronary artery, the 6-French Judkins right #4 which seated  the right coronary artery, and the bypass graft to the right coronary  system.   The left coronary bypass catheter which seated the saphenous vein graft to  the diagonal system and a saphenous vein graft to the obtuse marginal  system.   At the conclusion of the procedure the catheters were removed, the patient  was moved back to the cardiology holding area where the femoral artery  sheath was removed.  Hemostasis was obtained using direct manual pressure.  At the conclusion of the hold there is no evidence of ecchymosis or hematoma  formation.  Distal pulses were intact.  Total fluoroscopic time was 8.1  minutes.  Total ionized contrast use was 90 mL.   RESULTS:  The aorta:  Aortic pressure was measured at 129/98 with a mean of  112 mmHg.   CORONARY ANGIOGRAPHY:  The left main coronary artery is a large vessel which  has luminal irregularities.   The left anterior descending coronary artery was  a moderate caliber vessel  which has a significant stenosis about 60% in the mid portion.  There was a  large diagonal which is occluded in its mid portion.  The distal left  anterior descending system is best seen with injections of the left internal  mammary artery and appears to be mildly, but diffusely diseased. The  diagonal branch distal to the touchdown of the saphenous vein is patent with  some luminal disease.   The circumflex system is a moderate caliber.  The circumflex coronary artery  itself has only minimal disease, but is a relatively small vessel.  However,  the first obtuse marginal is a small vessel and is diffusely diseased and  ungrafted.  The second obtuse marginal is occluded in its mid portion, the  distal portion of which is seen via injections of the saphenous vein graft  to that system and has no obstructive  disease.  The third obtuse marginal is  occluded at its mid portion.  It bifurcates and the proximal portion of the  bifurcation is where the touchdown of the saphenous vein graft is and it has  only luminal disease.   The right coronary artery is a large dominant vessel with severe disease in  its distal portion with an 80% distal lesion and a the 95% lesion in the  posterior descending coronary artery.   The left internal mammary to the LAD is patent in its ostium, body, and  anastomosis and touches down in the mid portion of the vessel.  Vein graft  to the obtuse marginal system is patent in its ostium, body, and both  anastomoses and touches down in the mid portion of the second obtuse  marginal in the mid portion of the proximal bifurcation of the third obtuse  marginal.   The saphenous vein graft to the first diagonal is patent in its ostium,  body, and anastomosis and touches down in the mid portion of that vessel.   The saphenous vein graft to the right coronary artery system is patent in  its ostium, body, and  all three anastomoses.  It touches down in the mid  posterior descending, posterior lateral, and the acute marginal branches.   No left ventriculogram was performed.   ASSESSMENT:  1.  Severe three-vessel coronary disease.  2.  Patent left internal mammary artery to the left anterior descending.  3.  Patent saphenous vein graft to the right coronary system.  4.  Patent saphenous vein graft to the diagonal system.  5.  Congestive heart failure.   PLAN:  Medical therapy for the congestive heart failure.  I have discussed  these results with Dr. Geralynn Rile and we will proceed.      Vida Roller, M.D.  Electronically Signed     JH/MEDQ  D:  08/07/2005  T:  08/07/2005  Job:  [id number]   cc:   Salvadore Farber, M.D. [facility]  [address]  [address]  [place]  [place] [address]   Reuben Likes, M.D.  [address]  [place]  [place] [address] Fax: [phone]

## 2011-05-12 NOTE — Assessment & Plan Note (Signed)
[facility]                                   ON-CALL NOTE   WREN, GALLAGA                     MRN:          [id number]  DATE:11/08/2006                            DOB:          April 17, 1961    PATIENT IDENTIFICATION:  Mr. Chartrand is a 50 year old man with a history  of ischemic cardiomyopathy who I follow in the clinic. He presented today  with chest and abdominal pain in the emergency room and was admitted for  observation. First of the cardiac markers were normal. He left the hospital  AMA as he had to pick up his daughter. He called to followup with me and to  see if he needed to come back to the hospital. He says his pain has  resolved, and he feels much better. However, upon reviewing his labs, he has  evidence of chemical pancreatitis with a lipase of 643 and an amylase of  348. His ALT and AST are within the normal range. He does have a history of  gallbladder sludge. He denies any fevers or chills. At this point, I told  him that as long as he is feeling well he could remain at home, but he  should minimize p.o. intake. If he has recurrent pain, fevers or chills, he  should come to the emergency room right away. We will set him for a right  upper quadrant ultrasound in the morning to further evaluate for evidence of  coleocystitis or any complications regarding his pancreatitis.     Bevelyn Buckles. Bensimhon, MD  Electronically Signed    DRB/MedQ  DD: 11/08/2006  DT: 11/09/2006  Job #: [id number]

## 2011-05-12 NOTE — Discharge Summary (Signed)
NAME:  Chad [name], Chad [name] NO.:  [id number]   MEDICAL RECORD NO.:  [id number]          PATIENT TYPE:  INP   LOCATION:  4743                         FACILITY:  [facility]   PHYSICIAN:  Salvadore Farber, M.D. LHCDATE OF BIRTH:  09/07/1961   DATE OF ADMISSION:  08/04/2005  DATE OF DISCHARGE:  08/08/2005                                 DISCHARGE SUMMARY   PRIMARY CARE PHYSICIAN:  Salvadore Farber, M.D. [facility].   PRIMARY CARE PHYSICIAN:  Reuben Likes, M.D.   He will follow up in the heart failure clinic with Arvilla Meres, M.D.  [facility]   ALLERGIES:  1.  SULFA.  2.  SHELLFISH.   PROCEDURE:  Left heart cardiac catheterization.   PRINCIPAL DIAGNOSIS:  Non-ST elevation myocardial infarction/congestive  heart failure.   OTHER DIAGNOSES:  1.  Coronary artery disease.  2.  Ischemic cardiomyopathy.  3.  Hypertension.  4.  Moderate mitral regurgitation.  5.  Mild chronic renal insufficiency.  6.  Asthma.  7.  History of back injury.  8.  Shellfish and IV contrast allergy.   HISTORY OF PRESENT ILLNESS:  A 50 year old African American male who is  recently status post CABG x 7, performed by Dr. Laneta Simmers on June 29, 2005.  He  was in his usual state of health until the morning of August 04, 2005, when  he woke up with 10/10 shoulder and arm pain similar to his previous angina.  Notably, he had stopped taking his Lasix and potassium chloride sometime  between July 12, 2005 and August 04, 2005.  The patient presented to the  [facility] ED, where ECG showed no acute changes and his troponin was  initially elevated at 0.25, eventually peaking at 1.88 with a CK of 133 and  a MB of 6.6.  He was admitted to [facility] service for further  evaluation.   HOSPITAL COURSE:  The patient's BNP, on admission, was markedly elevated at  1,319.3.  He was initiated on IV diuresis with Lasix.  With IV diureses, his  symptoms improved and plans were made for cardiac catheterization,  given his  elevation of troponin and a recent bypass surgery.  He had recurrent chest  pain on the morning of August 07, 2005, and underwent left heart cardiac  catheterization showing patency of all previously placed vein grafts and  LIMA.  Left ventriculography was not performed.  This morning the patient is  very eager to be discharged.  We have discussed his case with Dr. Arvilla Meres of our heart failure clinic, and it has been recommended that he  undergo dyssynchrony echocardiogram; however, the patient is not willing to  stay to have this performed and would prefer to have this done as an  outpatient.  He has been set up to follow up in our heart failure clinic  with Dr. Gala Romney on Thursday, August 10, 2005 at 1:30 p.m. at which time  he will have repeat B-MET and BNP performed.  He is being discharged home  today in satisfactory condition.   DISCHARGE LABS:  Hemoglobin 12.0, hematocrit 36.2, WBC 10.3, platelets  224,  MCV 77.5.  Sodium 140, potassium 4.7, chloride 107, CO2 25, BUN 18,  creatinine 1.3, glucose 136.  Peak CK 133, peak MB 6.6, peak troponin I  1.88.  Total cholesterol 88 and triglycerides 74, HDL 25, LDL 48.  Total  bilirubin 1.6, alkaline phosphatase 114, AST 22, ALT 45, albumin 3.9,  calcium 8.8.  BNP 765.4.  TSH 2.211.   DISPOSITION:  The patient is being discharged home today in good condition.   FOLLOWUP PLANS AND APPOINTMENT:  1.  He has followup with Dr. Arvilla Meres in the heart failure clinic on      Thursday, August 10, 2005 at 1:30 p.m.  He will have a repeat BNP and B-      MET on that day, and will likely be set up for a dyssynchrony      echocardiogram as well, and possibly cardiac MRI to determine the extent      of LV scarring.  2.  He will also have follow up with Dr. Leslee Home within the next month.   DISCHARGE MEDICATIONS:  1.  Atacand 16 mg every day.  2.  Tobrex 100 mg every day.  3.  Lasix 80 mg b.i.d.  4.  Plavix 75 mg  every day.  5.  K-Dur 20 mEq every day.  6.  Aldactone 25 mg every day.  7.  Digoxin 0.25 mg every day.  8.  Aspirin 81 mg every day.  9.  Lipitor 40 mg q.h.s.  10. Nitroglycerin 0.4 mg sublingual p.r.n. chest pain.   OUTSTANDING LABS AND STUDIES:  None.   DURATION OF DISCHARGE ENCOUNTER:  Forty five minutes.      Ok Anis, NP      Salvadore Farber, M.D. [facility]  Electronically Signed    CRB/MEDQ  D:  08/08/2005  T:  08/08/2005  Job:  [id number]   cc:   Salvadore Farber, M.D. [facility]  [address]  [address]  [place]  [place] [address]   Reuben Likes, M.D.  [address]  [place]  [place] [address]  Fax: [phone]   Arvilla Meres, M.D. [facility]  [facility]  [address]  [place]  [place] [address]

## 2011-05-12 NOTE — Op Note (Signed)
[place]  Patient:    Chad [name], Chad [name] Visit Number: [id number] MRN: [id number]          Service Type: DSU Location: [facility] Attending Physician:  Twana First Dictated by:   Elana Alm Thurston Hole, M.D. Proc. Date: 12/31/01 Admit Date:  12/31/2001                             Operative Report  PREOPERATIVE DIAGNOSIS:  Right shoulder rotator cuff tear with impingement.  POSTOPERATIVE DIAGNOSES: 1. Right shoulder partial rotator cuff tear. 2. Right shoulder partial glenoid labrum tear. 3. Right shoulder impingement.  PROCEDURES: 1. Right shoulder examination under anesthesia followed by arthroscopic    partial labrum tear debridement. 2. Right shoulder partial rotator cuff debridement. 3. Right shoulder subacromial decompression.  SURGEON:  Elana Alm. Thurston Hole, M.D.  ASSISTANT:  Julien Girt, P.A.  ANESTHESIA:  General.  OPERATIVE TIME:  40 minutes.  COMPLICATIONS:  None.  INDICATIONS FOR PROCEDURE:  Chad [name] is a 50 year old gentleman who has had three months of right shoulder pain with signs and symptoms and MRI documenting a partial versus small complete rotator cuff tear.  He has failed conservative care and is now to undergo arthroscopy.  DESCRIPTION OF PROCEDURE:  Chad [name] was brought to the operating room on December 31, 2001, and placed on the operative table in the supine position. After an adequate level of general anesthesia was obtained, his right shoulder was examined under anesthesia.  He had full range of motion of his shoulder with stable ligamentous exam.  He was then placed in the beach chair position and his shoulder and arm were prepped using sterile Betadine and draped using sterile technique.  Originally through a posterior arthroscopic portal, the arthroscope with a pump attachment was placed and through an anterior portal an arthroscopic probe was placed.  On initial inspection, the  articular cartilage and the glenohumeral joint were intact.  The anterior labrum in the superior one-third showed a partial tear which was debrided.  The biceps tendon anchor was hypermobile, but I did not feel that this was abnormal, i.e. was not a definite SLAP lesion, but did have some degenerative fraying of this and this was debrided, but I did not feel that a SLAP repair was necessary or indicated.  After this debridement was carried out, the rest of the biceps tendon was found to be intact, as well as the biceps tendon anchor and the posterior labrum.  The inferior labrum and anterior glenohumeral ligament complex were intact.  The inferior capsule recess was free of pathology.  The rotator cuff showed a small partial tear of the infraspinatus and supraspinatus, 20%, which was debrided.  The subscapularis was intact.  The subacromial space was entered and a lateral arthroscopic portal was made. Moderately thickened bursitis was resected.  Underneath this the rotator cuff was inflamed and thickened with a partial tear on the bursal side, which was debrided, but no evidence of a complete tear.  Subacromial decompression was carried out, removing 6-8 mm of the undersurface of the anterior, anterolateral, and anteromedial acromion and CA ligament release carried out. The [facility] joint was not disturbed.  After this was done, the shoulder could be brought through a full range of motion with no impingement on the rotator cuff.  At this point, it was felt that all pathology had been satisfactorily addressed.  The instruments were removed.  The portals were closed 3-0 nylon  suture and injected with 0.25% Marcaine with epinephrine.  Sterile dressings and a sling applied.  The patient was awaken and taken to the recovery room in stable condition.  FOLLOW-UP CARE:  Chad [name] will be followed as an outpatient on Vicodin and Naprosyn.  He will be seen back in the office in a week for sutures  out and follow-up. Dictated by:   Elana Alm Thurston Hole, M.D. Attending Physician:  Twana First DD:  12/31/01 TD:  12/31/01 Job: [id number]

## 2011-05-12 NOTE — Op Note (Signed)
NAME:  Chad [name], Chad [name] NO.:  [id number]   MEDICAL RECORD NO.:  [id number]          PATIENT TYPE:  INP   LOCATION:  2012                         FACILITY:  [facility]   PHYSICIAN:  Doylene Canning. Ladona Ridgel, M.D.  DATE OF BIRTH:  Jul 24, 1961   DATE OF PROCEDURE:  DATE OF DISCHARGE:                                 OPERATIVE REPORT   ELECTROPHYSIOLOGIC PROCEDURE NOTE.   PROCEDURE PERFORMED:  Implantation of a single chamber defibrillator.   INDICATIONS:  Ischemic cardiomyopathy with class 2 heart failure, ejection  fraction 30%.   I, INTRODUCTION:  The patient is a 50 year old male with a history of  myocardial infarction, status post MI, status post angioplasty, Status post  bypass surgery who initially had very severe LV dysfunction with an EF of 10  % and class 2 symptoms.  Over the last several months he has improved and  most recent EF was 28% by Myoview stress testing with large extensive scar.  The patient's heart failure has gone from class 3 to class 2.  He is now  referred for ICD implantation.   II. PROCEDURE:  After informed consent was obtained the patient was taken to  the diagnostic EP lab in the fasting state.  After the usual preparation and  draping intravenous Fentanyl and metaxalone was given for sedation.  Then 30  mL of lidocaine was infiltrated into the left infraclavicular region.  An 8  cm incision was carried out over this region and electrocautery utilized to  dissect down into the fascial plane.  Then 10 mL of contrast is injected  into the left upper extremity venous system.  It demonstrated a patent left  subclavian vein.  The vein was punctured x1 and the guide model 0158, 9-  [place] active fixation defibrillation lead, serial number [id number] was  advanced into the right ventricle.  Mapping was carried out along the RV  septum.  The R waves were somewhat decreased.  At the final site the R waves  varied between 6 and 7 millivolts.  The patient's  threshold, at this  location, was 0.5 volts at 0.5 milliseconds with a patient impedance of 642  Ohms; 10 volt pacing did not stimulate the diaphragm.  With the lead in  satisfactory position and actively fixed it was secured to the subpectoralis  fascia with a figure-of-eight silk suture.  The sewing sleeve was also  secured with a silk suture.  Electrocautery was utilized to make a  subcutaneous pocket.   Kanamycin irrigation was utilized to irrigate the pocket and electrocautery  utilized to assure hemostasis.  The Guidant Vitality 2 ICD Model L500660,  serial number [id number] was connected to the defibrillation lead and placed in  the subcutaneous pocket.  It was secured with a silk suture.  Additional  kanamycin was then utilized to irrigate the pocket and defibrillation  threshold testing carried out.   After the patient was more deeply sedated with Fentanyl and Versed VF was  induced with a T wave shock.  Initially a 14 joules shock was delivered.  It  was terminated when ventricular fibrillation restored sinus rhythm.  Five  minutes was allowed to elapse and a second DFT test carried out.  Again VF  was induced with a T wave shock; and this time an 11 joules shock was  delivered which terminated VF and restored sinus rhythm.  With satisfactory  defibrillation thresholds, the incision was closed with a layer of 2-0  Vicryl, followed by a layer of 3-0 Vicryl, followed by a layer of 4-0  Vicryl.  Benzoin was painted on the skin.  Steri-Strips were applied and a  pressure dressing was placed.  The patient was returned to his room in  satisfactory condition.   III. COMPLICATIONS:  There were no immediate procedure complications.   IV. RESULTS:  This demonstrates successful implantation of a Guidant Single  __________ defibrillator in a patient with an ischemic cardiomyopathy and  class 2 congestive heart failure with ejection fraction of 28%.           ______________________________   Doylene Canning. Ladona Ridgel, M.D.     GWT/MEDQ  D:  11/24/2005  T:  11/24/2005  Job:  [id number]   cc:   Arvilla Meres, M.D. [facility]  [facility]  [address]  [place]  [place] [address]   Stacie Glaze, M.D. [facility]  [address] [place]  [place] [address]

## 2011-05-12 NOTE — Assessment & Plan Note (Signed)
[facility]                                 ON-CALL NOTE   YUE, GLASHEEN                     MRN:          [id number]  DATE:05/11/2008                            DOB:          1961/08/26    PRIMARY CARDIOLOGIST:  Bevelyn Buckles. Bensimhon, MD   HISTORY OF PRESENT ILLNESS:  Mr. Mysliwiec called tonight because he has  had some episodes of chest pain and had to take nitroglycerin.  He also  feels a sort of general malaise feeling that he cannot describe further.  In association with this is an elevated blood pressure, today 160/110.  Mr. Prestage states that he is compliant with all of his medications.   I advised Mr. Castorena that he should come to the emergency room to be  checked.  He stated he did not wish to do that.  I then advised him if  he refused to come to the emergency room I would call the office to see  if they could get him in to be seen in the next day or so.  He stated  this was agreeable to him.  I reviewed his medications with him, and  found that he feels the Norvasc 2.5 mg daily is causing him muscle  aches, so I did not increase this which was my original plan.  He also  stated his heart rate is dropping into the 50s at times, so I did not  increase the Coreg either.  I advised him to please be compliant with  all of his medications and that we would see him as soon as possible in  the office.      Theodore Demark, PA-C  Electronically Signed      Gerrit Friends. Dietrich Pates, MD, [facility]  Electronically Signed   RB/MedQ  DD: 05/11/2008  DT: 05/12/2008  Job #: [id number]

## 2011-05-12 NOTE — Assessment & Plan Note (Signed)
Chad [name]                            CARDIOLOGY OFFICE NOTE   MODESTO, GANOE                     MRN:          [id number]  DATE:02/22/2007                            DOB:          1961/05/22    INTERVAL HISTORY:  Chad [name] is a 50 year old male with the history  of coronary artery disease, status post bypass grafting, July of 2006,  also ischemic cardiomyopathy with EF of 30-35%, also has history of  hypertension, hyperlipidemia and contrast dye allergy.  He presents  today for routine followup.  He has missed several visits.   He says recently he has been fairly stressed out and his blood pressure  has been somewhat labile.  He says his breathing is doing quite well.  He denies any orthopnea or PND.  He has had some occasional episodes of  chest pain, some of which were severe.  This is about one to two times  per month.  He usually takes aspirin or nitroglycerin and they go away.  These are not related to exertion and can come on just about any time.   History of pancreatitis.   PHYSICAL EXAM:  He is well-appearing, in no acute distress, ambulates  around the clinic without respiratory difficulty.  Blood pressure is  130/92, his heart rate is 56, weight is 221.  HEENT:  Sclerae anicteric.  EOMI.  There are no xanthelasmas.  Mucous  membranes are moist.  Oropharynx is clear.  NECK:  The neck is supple.  No JVD.  Carotids are 2+ without bruits.  There is no lymphadenopathy or thyromegaly.  CARDIAC:  He has a regular rate and rhythm with no murmurs, rubs or  gallops.  LUNGS:  Clear.  ABDOMEN:  Soft, nontender, nondistended.  No hepatosplenomegaly, no  bruits, no masses.  EXTREMITIES:  Warm with no cyanosis, clubbing or edema.  NEUROLOGIC:  He is alert and oriented times three.  Cranial nerves II  through XII are intact.  Moves all four extremities without difficulty.  Affect is pleasant.   EKG shows sinus bradycardia with LVH.   There are deep T-wave inversions  inferiorly and anterolaterally, which are old.   ASSESSMENT AND PLAN:  1. Chest pain.  This has both typical and atypical features.  He has      had problems with this off and on.  Since it has been over a year      and a half since his last stress test, I think it is reasonable to      proceed with exercise Myoview.  We have discussed possibly adding      Imdur, but this gives him a severe headache and he is not      interested.  2. Coronary artery disease.  As above, we will proceed with stress      testing.  3. Congestive heart failure, secondary to ischemic cardiomyopathy.  He      is currently NYHA Class I.  We will see if his ejection fraction      has improved any on his stress test.  Continue current medications.  He was unable to tolerate Bidil due to headache.  4. Hypertension.  Blood pressure remains elevated.  We will increase      his Norvasc to 10 mg a day.     Chad [name]. Bensimhon, MD  Electronically Signed    DRB/MedQ  DD: 02/22/2007  DT: 02/22/2007  Job #: [id number]

## 2011-05-12 NOTE — H&P (Signed)
NAME:  Chad [name], SOLEY NO.:  [id number]   MEDICAL RECORD NO.:  [id number]          PATIENT TYPE:  EMS   LOCATION:  [facility]                         FACILITY:  [facility]   PHYSICIAN:  Bevelyn Buckles. Bensimhon, MDDATE OF BIRTH:  October 29, 1961   DATE OF ADMISSION:  08/30/2006  DATE OF DISCHARGE:                                HISTORY & PHYSICAL   CHIEF COMPLAINT:  Chest pain.   HISTORY OF PRESENT ILLNESS:  This is a 50 year old pleasant African American  gentleman with a history of ischemic cardiomyopathy, status post bypass  surgery in August 2006.  His graft anatomy is unknown.  Status post ICD  placement December 2006 for New York Heart Association Class III-IV symptoms  with an EF by nuclear imaging of 28%, hyperlipidemia and hypertension, who  states that for the past one to two weeks he has had chest pain with  associated back pain that consistent with his previous episodes of angina.  He has not really been taking any sublingual nitroglycerin  as his symptoms  have not been severe enough and only lasting a few minutes at a time.  Usually they spontaneously resolve.  Today, however, at rest he had the  sudden onset of 10/10 chest pain radiating to his back without any  aggravating or relieving any factors.  He said that it felt like his first  MI.  He denies any radiating pain and nausea, vomiting, or shortness of  breath with the symptoms.  However, called EMS given the symptoms were not  improving.  En route to the emergency room, he was given sublingual  nitroglycerin that within 45 seconds resolved his symptoms __________ .  While in the emergency room, his symptoms started the recur. As a result he  was given morphine and nitroglycerin IV.  He states that his pain has been  coming on and off over the past few days, at times lasting as long as 15 to  20 minutes but typically spontaneously resolving.  He states today it was  more severe in nature and as a result, he sought  medical attention.   The patient denies any lower extremity edema, orthopnea or PND.  He  currently sleeps on a flat bed with one to two pillows.  He states he can  climb a flight of stairs without any problems.  He says he is compliant with  his medication.   REVIEW OF SYSTEMS:  As in the HPI.  Otherwise 18-point review of systems is  negative.   ALLERGIES:  1. CONTRAST DYE.  2. SULFA.   PAST MEDICAL HISTORY:  1. Severe ischemic cardiomyopathy with EF of 20% with New York Heart      Association Class III to IV symptoms, status post single-chamber ICD in      December 2006.  2. Coronary artery disease, status post CABG in August 2006.  He had      patent grafts by recent cardiac catheterization.  3. Hypertension.  4. Hyperlipidemia.  5. History of a back injury, status post back surgery.  6. Asthma.   CURRENT MEDICATIONS:  1. Plavix 75 mg a  day.  2. Toprol-XL 150 mg daily.  3. Digoxin 0.25 mg daily.  4. Spironolactone 25 mg daily.  5. Aspirin 325 mg daily.  6. Potassium chloride 40 mEq daily.  7. Lipitor 40 mg daily.  8. Atacand 32 mg daily.  9. Lasix 40 mg daily.   SOCIAL HISTORY:  He works at a [employer].  He currently has  eight children with two grandkids. He never smoked and never drank alcohol.   FAMILY HISTORY:  His father passed away at age 35 with complications of  heart failure. His mother is alive with hypertension.   PHYSICAL EXAMINATION:  VITAL SIGNS:  Blood pressure 135/83, pulse 51,  respiratory rate 18, oxygen saturation 99% on room air, temperature 95.2.  GENERAL:  He is resting comfortably in no acute distress, alert and oriented  x3.  HEENT:  Pupils are equal, round and reactive to light.  Extraocular  movements are intact.  NECK:  Supple with no lymphadenopathy.  His JVP is flat.  He has no carotid  bruits.  Oropharynx is clear.  LUNGS:  Clear to auscultation bilaterally without any wheezes, rhonchi or  rales.  CARDIOVASCULAR:   Regular rate and rhythm.  Normal S1 and S2 without any  murmurs, rubs or gallops.  He is a nondisplaced PMI that is normal in size.  He has well-healed medium sternotomy and palpable ICD pocket that is intact.  EXTREMITIES:  2+ radial and posterior tibialis pulses symmetric bilaterally  without any lower extremity clubbing, cyanosis or edema.  ABDOMEN:  Positive bowel sounds.  Soft, nontender, nondistended.  NEUROLOGIC:  Nonfocal.   LABORATORY DATA:  Potassium 2.3, bicarb 23, creatinine 1.3, glucose 111.  White count 4.5, hematocrit 42, platelets 170.  Cardiac enzymes negative.  Digoxin level 0.4. An EKG shows sinus bradycardia at a rate of 54 with left  axis deviation and some T wave inversions laterally that are old.  His  intervals are normal.  He does not have any acute ST segment changes.  A BNP  is 72.  Chest x-ray shows a slightly enlarged cardiac silhouette with a  single chamber ICD in place with bibasilar atelectasis but no frank edema.   ASSESSMENT:  This 50 year old gentleman with history of ischemic  cardiomyopathy, status post bypass surgery in the past who presents with one  to two week history of unstable angina that became worse today.  He  currently has negative cardiac markers and unchanged EKG.  His symptoms  resolved with nitroglycerin in the emergency room.   PLAN:  1. Chest pain.  The patient will be admitted to telemetry bed.  He will be      ruled out by cardiac enzymes.  He will be continued on his aspirin,      Plavix, beta blocker, ARB, and statin.  Will await the results of is      laboratory evaluation before deciding what could be the best      diagnostic evaluation in this gentleman given his history.  2. Ischemic cardiomyopathy with reduced EF.  The patient has an ICD in      place.  This has been stable.  His has been on an excellent __________     regimen of medicines. Will supplement his potassium.  Will continue on      his Lasix, Atacand, beta  blocker, spironolactone and digoxin at this      time.  He is currently well compensated as      evidence by  his exam at a low BNP.  We will not make any changes at      this point in time.  3. Hypertension.  Continue his ARB and beta blocker.  4. Hyperlipidemia. The patient will continue his Lipitor.  A fasting lipid      panel will be obtained in the morning.     ______________________________  Eston Esters. Sherryll Burger, MD      Bevelyn Buckles. Bensimhon, MD  Electronically Signed    BRS/MEDQ  D:  08/31/2006  T:  08/31/2006  Job:  [id number]

## 2011-05-12 NOTE — Assessment & Plan Note (Signed)
[facility]                              CARDIOLOGY OFFICE NOTE   CARRICK, RIJOS                     MRN:          [id number]  DATE:09/14/2006                            DOB:          May 28, 1961    PATIENT IDENTIFICATION:  Chad [name] is a 50 year old male who returns for  routine follow-up.   PROBLEM LIST:  1. Congestive heart failure secondary to ischemic cardiomyopathy.      a.     Echocardiogram September 2006:  Ejection fraction of 20-25%.      b.     Follow-up echocardiogram March 2007:  Ejection fraction 30-35%.       There was mild right ventricular dysfunction.      c.     Cardiopulmonary exercise test August 2006:  Peak PO2 of 18.7,       peak RER 1.1, VEV CO2 slope was 37.      d.     Status post implantable cardioverter-defibrillator.  2. Coronary artery disease, status post coronary artery bypass graft July      2006.  3. Hypertension.  4. Mild chronic renal insufficiency, baseline creatinine of 1.3 to 1.5.  5. CONTRAST DYE allergy.  6. Hyperlipidemia.   CURRENT MEDICATIONS:  1. Aspirin 325 mg a day.  2. Plavix 75 mg a day.  3. Atacand 32 mg a day.  4. Digoxin 0.25 mg a day.  5. Aldactone 25 mg a day.  6. Toprol XL 150 mg a day.   ALLERGIES:  SULFA, CONTRAST DYE and SHELLFISH.  Also unable to tolerate  COREG.   INTERVAL HISTORY:  Chad [name] returns today for routine follow-up.  He  was recently in the hospital for some atypical chest and abdominal pain.  His LFTs were mildly elevated.  Ultrasound of his abdomen was normal.  It  was felt that his elevated LFTs might be due to a statin, which was stopped,  or possibly related to poor gallbladder function.  If his symptoms  continued, an outpatient HIDA scan was recommended.  He returns today saying  he feels quite well.  He has gone to the gym and started exercising quite  vigorously.  He has not had any symptoms with this.  He does note that he  has had a  significant amount of problems affording his medications and has  not taken many of his medications.  He uses Lasix only p.r.n.  He has not  had any orthopnea, PND or lower extremity edema.   PHYSICAL EXAMINATION:  GENERAL:  He is well-appearing, in no acute distress.  He ambulates around the clinic without difficulty.  VITAL SIGNS:  Blood pressure initially was 176/120, follow-up was 156/105.  Heart rate was 56.  HEENT:  Sclerae anicteric, EOMI.  There is no xanthelasma.  Mucous membranes  are moist.  NECK:  Supple, no JVD.  Carotids 2+ bilaterally without bruits.  There is no  lymphadenopathy or thyromegaly.  CARDIAC:  Regular rate and rhythm with no murmurs, rubs or gallops.  LUNGS:  Clear.  ABDOMEN:  Soft, nontender, nondistended, no hepatosplenomegaly, no bruits,  no masses.  EXTREMITIES:  Warm with no clubbing, cyanosis, or edema.  NEUROLOGIC:  Alert and oriented x3.  Cranial nerves II-XII are intact.  He  moves all 4 extremities without difficulty.   EKG shows sinus bradycardia, rate of 56, with incomplete left bundle branch  block and LVH.  There are T wave inversions in V4 through V6, which are  chronic.   ASSESSMENT AND PLAN:  1. Hypertension.  This is poorly controlled in the setting of medication      noncompliance.  We have gone ahead and continued to simply his medical      regimen from a cost point of view.  His medications will be dictated at      the bottom of this note.  2. Congestive heart failure secondary to ischemic cardiomyopathy.  He is      currently NYHA class I.  He is on good medications.  3. Coronary artery disease.  This is stable.  Given the fact that he does      not have any stents, I think it would be reasonable to stop his Plavix      due to cost issues.   DISPOSITION:  Return to clinic in 6 months.  Current medications will be:   1. Norvasc 5 mg a day.  2. Lisinopril/hydrochlorothiazide 40/25 mg a day.  3. Toprol XL 150 mg a day.  4. Digoxin  0.25 mg a day.  5. Aldactone 25 mg a day.  6. Aspirin 81 mg a day.  7. Lasix 80 mg p.r.n.                                Bevelyn Buckles. Bensimhon, MD    DRB/MedQ  DD:  09/14/2006  DT:  09/17/2006  Job #:  [id number]

## 2011-05-12 NOTE — H&P (Signed)
NAME:  Chad [name], Chad [name] NO.:  [id number]   MEDICAL RECORD NO.:  [id number]          PATIENT TYPE:  EMS   LOCATION:  [facility]                         FACILITY:  [facility]   PHYSICIAN:  Pricilla Riffle, MD, FACCDATE OF BIRTH:  [birth year]   DATE OF ADMISSION:  11/08/2006  DATE OF DISCHARGE:                              HISTORY & PHYSICAL   PATIENT PROFILE:  A 50 year old African American male with a prior  history of CAD and ischemic cardiomyopathy who presents with chest and  abdominal discomfort.   PROBLEM LIST:  1. Coronary artery disease      a.     June 29, 2005, coronary artery bypass graft x7 by Dr. Kathlee Nations Trigt.  Left internal mammary artery to the left anterior       descending artery, vein graft to the diagonal, sequential vein       graft to the obtuse marginal-1 and obtuse marginal-2, sequential       vein graft to the acute marginal, first and second branches of the       posterior descending artery.      b.     August 2006 cardiac catheterization, grafts patent.  2. Ischemic cardiomyopathy, ejection fraction 30% to 35% by      echocardiogram in March 2007.      a.     November 24, 2005, status post automatic implantable       cardioverter-defibrillator, Guidant Vitality 2.  3. Hypertension.  4. Hyperlipidemia.  5. Mild chronic renal insufficiency with a baseline creatinine of 1.3      to 1.5.  6. Asthma.  7. History of back injury status post lumbar surgery.   HISTORY OF PRESENT ILLNESS:  A 50 year old African American male with a  history of CAD and ischemic cardiomyopathy status post CABG x7.  He was  hospitalized September 2007 with complaints of chest, epigastric, and  abdominal pain, and was noted to have elevated LFTs.  He underwent GI  evaluation while hospitalized and his Lipitor was discontinued.  Abdominal CT was performed and showed no acute findings.  He has  followup outpatient gallbladder ultrasound, which revealed gallbladder  sludge.  He did not follow up with GI otherwise.  He did well after  discharge and his Lipitor, per patient, was reinitiated in October 2007  after repeat LFTs were normal.  Yesterday, he noted intermittent chest,  abdominal, and back discomfort as well as pressure throughout the day.  This morning, at approximately 5:30 a.m. while at work, he had recurrent  10 out of 10 chest, back, abdominal pain associated with shortness of  breath and diaphoresis.  The pain persisted at the 10 out of 10 level  and, therefore, he came to the ED at approximately 8:30 a.m.  ECG showed  no acute changes and he was started on heparin and IV nitroglycerin with  reduction of the pain to a 3 out of 10.  He continues to have  discomfort.   ALLERGIES:  SULFA AND CONTRAST.   HOME MEDICATIONS:  1. Aspirin 81 mg daily.  2.  Digoxin 0.25 mg daily.  3. Aldactone 25 mg daily.  4. Toprol XL 150 mg daily.  5. Norvasc 5 mg daily.  6. Lisinopril/hydrochlorothiazide 40/25 mg daily.  7. Lasix 80 mg p.r.n., which he took this morning.  8. Lipitor 40 mg daily.   FAMILY HISTORY:  Mother is alive at age 67 with hypertension, father  died of heart failure at age 15.  He has 4 brothers and a sister.  He  does have a younger brother with coronary disease and heart failure.   SOCIAL HISTORY:  He lives in [place] with some of his 8 children.  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  As we interview him, he is on and off  his cell phone with business dealings.  He has 8 children and 2  grandchildren.  He denies any tobacco, alcohol, or drug use in his  lifetime.  He minimally exercises.   REVIEW OF SYSTEMS:  Positive for chest pain and abdominal as outlined in  the HPI.  All other systems reviewed and negative.   PHYSICAL EXAMINATION:  VITAL SIGNS:  Temperature 98.1, heart rate 81,  respirations 26, blood pressure 152/91.  GENERAL:  Pleasant African American  male in no acute distress.  Awake,  alert, and oriented x3.  NECK:  Normal carotid upstrokes.  No bruits or JVD.  LUNGS:  Respirations regular and unlabored.  Clear to auscultation.  CARDIAC:  Regular S1, S2.  No S3, S4, or murmurs.  ABDOMEN:  Round, soft with, initially, reproducible right upper quadrant  pain and tenderness by my exam and by the patient's description.  However, on repeat evaluation approximately 2 hours later by Dr. Tenny Craw,  the patient denied any abdominal tenderness.  EXTREMITIES:  Warm, dry.  No clubbing, cyanosis, or edema.  Dorsalis  pedis, posterior tibia pulses 2+ and equally bilaterally.  NEUROLOGIC:  Grossly intact and nonfocal.  HEENT:  Atraumatic, normocephalic.  SKIN:  Warm and dry and without lesions or masses.   DIAGNOSTIC STUDIES:  Chest x-ray is pending.  EKG show sinus rhythm at a  rate of 73 with T-wave inversions in leads 3, aVF, V5 and V6, as well as  left axis deviation.  This is baseline for him.   LABORATORY DATA:  Hemoglobin 14.7, hematocrit 43.9, WBC 3.7, platelets  158,000.  Sodium 141, potassium 3.6, chloride 103, CO2 27, BUN 15,  creatinine 1.2, glucose 108.  Calcium 9.6, total protein 7.2, albumin  4.4, AST 30, ALT 25, total bilirubin 1.2, alkaline phosphatase 62.  BNP  was less than 30.  PT 13.2, INR 1, PTT 32.  Cardiac markers are pending.   ASSESSMENT/PLAN:  1. Chest, abdominal, and back pain.  The patient has had persistent      symptoms since 5:30 a.m.  Cardiac enzymes are pending.  Initially,      he did have reproducible abdominal discomfort, especially in the      right upper quadrant.  However, when Dr. Tenny Craw evaluated him, he did      not have any complaints of abdominal discomfort.  It is not clear      what is going on there.  His liver function tests are normal.  We      will check an amylase and lipase and, for the time being, hold his     Lipitor.  As he continues to have chest discomfort, although mild,      and  cardiac  markers are still pending, we will plan to admit him      for observation and cycle his cardiac enzymes.  We will continue      his home medications and he has been added on heparin and      nitroglycerin.  He says his symptoms are similar to previous      myocardial infarction pain as well as symptoms that he exhibited      prior to his last catheterization and his last hospitalization in      September 2007.  2. Ischemic cardiomyopathy.  He is euvolemic.  He ran out of his      Toprol yesterday and we will reinitiate this.  Continue ACE      inhibitor and aldactone.  3. Hypertension.  Blood pressure is elevated.  Reinitiate Toprol      therapy and consider switching to Coreg, although compliance sounds      like it is probably an issue.  4. Hyperlipidemia.  The patient feels he has had issues with Lipitor      in the past and was replaced on this in October.  He seems to      attribute his entire presentation to the Lipitor, although his      liver function tests are currently normal.  As noted above, he did      complain of epigastric and right upper quadrant pain and tenderness      earlier.  However, this is somehow or another resolved.  For the      time being, we will hold his Lipitor.  5. History of asthma.  He is not currently wheezing.  6. Mild chronic renal insufficiency.  His creatinine, today, is 1.2.      [name], ANP      Pricilla Riffle, MD, [facility]  Electronically Signed    CB/MEDQ  D:  11/08/2006  T:  11/09/2006  Job:  [id number]

## 2011-05-12 NOTE — Op Note (Signed)
NAME:  Chad [name], Chad [name] NO.:  [id number]   MEDICAL RECORD NO.:  [id number]          PATIENT TYPE:  INP   LOCATION:  2311                         FACILITY:  [facility]   PHYSICIAN:  Burna Forts, M.D.DATE OF BIRTH:  1961/08/20   DATE OF PROCEDURE:  06/29/2005  DATE OF DISCHARGE:                                 OPERATIVE REPORT   PROCEDURE PERFORMED:  Intraoperative transesophageal echocardiogram.   INDICATIONS FOR PROCEDURE:  Chad [name] is a 50 year old black male who  presents with significant severe coronary artery disease having sustained an  out of hospital myocardial infarction.  He arrived today in the holding area  for presumed placement of coronary artery bypass grafting and possible  mitral valve repair.  We have been asked to place a transesophageal echo  probe for evaluation of cardiac structures, especially, the mitral valve  during this operative procedure.   DESCRIPTION OF PROCEDURE:  He was taken to the operating room after  placement of lines after which he underwent induction of general anesthesia.  This was accomplished.  The trachea was intubated.  The transesophageal echo  probe was then lubricated and passed oropharyngeally through the stomach and  then withdrawn for imaging cardiac structures.   Precardiopulmonary bypass transesophageal echocardiogram examination  (limited exam):   Left ventricle.  This is a dilated left ventricular chamber with essentially  a 6 cm end diastolic dimension.  There is minimal left ventricular  hypertrophy with short axis chamber wall thicknesses of about 11 to 12 mm.  There is overall poor left ventricular function as noted by depressed  thickening and inward movement in all segmental walls in a short axis view.  There is significant inferior to posterior wall hypokinesia associated with  nearly akinetic portion of the inferior wall in a short axis view.  Again,  long axis views demonstrated globular  hypokinesis with a large almost  globular appearance in the apex, also with diminished thickening and  excursion during systolic contraction.  Papillary muscles are well seen,  well outlined associated with this dilated left ventricular chamber.   Mitral valve.  This is a thin, compliant mitral valve apparatus seen in  multiple views.  The annular diameter is about 4 cm.  The mitral valve  itself appears to open satisfactorily during diastolic inflow, close  satisfactorily during systolic contraction, Doppler examination on this  valve in multiple views demonstrates several small flame jets originating at  the commissures of the valves particularly.  This would all be rated as only  mild or less than mild and at some points trace.  So there is no moderate or  even severe mitral regurgitant flow noted.  The left anterior chamber is  essentially normal and the appendage is well visualized and no masses are  noted within.  The interatrial septum was interrogated and is intact.   Aortic valve.  Normal, thin, trileaflet valve.   Right ventricle.  Normal right ventricular chamber noted.  Good  contractility.   Tricuspid valve.  Thin, mobile, compliant.  Trace regurgitant flow was  noted.  Right atrial chamber was noted and intact pulmonary  artery catheter  was noted within.   Coronary artery bypass grafting was carried out by Dr. Laneta Simmers.  The patient  was rewarmed and separated from cardiopulmonary bypass with the initial  attempt.   Postcardiopulmonary bypass transesophageal echocardiogram examination  (limited exam):   Left ventricle.  In the early bypass period there was some dyskinetic  activity, especially in the septal and infraseptal area of the left  ventricular chamber in a short axis view.  The anterior and anterolateral  wall appear to be contractile and somewhat vigorous.  This with the addition  of inotropes in the postbypass period.  After 10 to 15 minutes from   separation from cardiopulmonary bypass, these images area improved in that  there is more synchrony noted in the left ventricular chamber slightly more  contractility noted.  It does remain, however, a globally hypocontractile  left ventricular chamber but somewhat improved from the early postbypass  period. The dyssynergy noted in the septal and infraseptal area is gone.  The inferior area and posterior wall remain severely hypokinetic as well as  a portion of that infraseptal area.  The anterior, anterolateral and  anteroseptal wall all appear contractile at this time.  Long axis views are  obtained as well, again demonstrating that inferior and posterior wall  significant hypokinesis.   Mitral valve. Analysis and views of the mitral valve are again obtained. The  regurgitant flow across this valve again is considered to be trace and not  significant.  The rest of the cardiac examination is as previously  described.  The patient was then returned to the cardiac intensive care unit  in stable condition.       JTM/MEDQ  D:  06/29/2005  T:  06/29/2005  Job:  [id number]

## 2011-05-12 NOTE — H&P (Signed)
NAME:  Chad [name], Chad [name] NO.:  [id number]   MEDICAL RECORD NO.:  [id number]          PATIENT TYPE:  INP   LOCATION:  1825                         FACILITY:  [facility]   PHYSICIAN:  Bevelyn Buckles. Bensimhon, MDDATE OF BIRTH:  1961/11/29   DATE OF ADMISSION:  08/30/2006  DATE OF DISCHARGE:                                HISTORY & PHYSICAL   ADDENDUM:  Given the patient's risk factors and concern for symptoms typical of his  prior angina as well as prior history, we are starting the patient on  heparin.  Guaiac is pending at this time.  He will also be continued on the  IV nitroglycerin that he was started on in the ejection fraction given that  has relieved his symptoms.     ______________________________  Eston Esters Sherryll Burger, MD      Bevelyn Buckles. Bensimhon, MD  Electronically Signed    BRS/MEDQ  D:  08/31/2006  T:  08/31/2006  Job:  [id number]

## 2011-05-12 NOTE — Discharge Summary (Signed)
   NAME:  Chad [name], Chad [name] NO.:  [id number]   MEDICAL RECORD NO.:  [id number]                   PATIENT TYPE:  INP   LOCATION:  3016                                 FACILITY:  [facility]   PHYSICIAN:  Kathaleen Maser. Pool, M.D.                 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   HISTORY OF PRESENT ILLNESS:  Mr. Durnil is a 51 year old black male with  a history of a previous laminotomy and microdiskectomy on the right side at  L4-5, and the patient presents now with severe left lower extremity  radicular pain, consistent with L4 radiculopathy.  Admission MRI scan  demonstrates evidence of a left-sided L4-5 extrapyramidal disk herniation  with compression of left-sided L4 nerve root.  The patient is admitted to  the hospital for pain control and work-up of this condition.   HOSPITAL COURSE:  The patient was admitted to the hospital.  Attempts at  medical management of this pain were unsuccessful.  The patient underwent an  epidural steroid shot which was also unsuccessful.  With that in mind, and  given the large structural abnormality present on the MRI scan, surgery was  offered in the hopeful improvement for his pain.  The patient underwent a  left-sided L4-5 extrapyramidal microdiskectomy with good results.  Postoperatively, the patient awakened with complete resolution of left lower  extremity radicular pain.  His strength and sensation are intact.  The wound  is healing well.  The patient was observed for one more day in the hospital.  Activity level was good.  The pain level remained greatly improved.  He was  discharged home.   CONDITION AT DISCHARGE:  Improved.   DISCHARGE FOLLOW UP:  In one week in my office.   DISCHARGE MEDICATIONS:  Vicodin as needed for  pain.                                               Henry A. Pool, M.D.    HAP/MEDQ  D:  04/08/2003  T:  04/08/2003  Job:  [id number]

## 2011-05-12 NOTE — Op Note (Signed)
[name], [name] NO.:  [id number]   MEDICAL RECORD NO.:  [id number]          PATIENT TYPE:  INP   LOCATION:  2311                         FACILITY:  [facility]   PHYSICIAN:  Evelene Croon, M.D.     DATE OF BIRTH:  Apr 28, 1961   DATE OF PROCEDURE:  DATE OF DISCHARGE:                                 OPERATIVE REPORT   PREOPERATIVE DIAGNOSIS:  Severe three vessel coronary artery disease status  post non-Q wave myocardial infarction.   POSTOPERATIVE DIAGNOSIS:  Severe three vessel coronary artery disease status  post non-Q wave myocardial infarction.   OPERATIVE PROCEDURE:  Median sternotomy, extracorporeal circulation,  coronary bypass graft surgery x 7 using left internal mammary artery graft  to the left anterior descending coronary artery, saphenous vein graft to the  diagonal branch of the left anterior descending, sequential saphenous vein  graft to the first and second obtuse marginal branches of the left  circumflex artery, and a sequential saphenous vein graft to the acute  marginal and first and second posterior descending branches of the right  coronary artery, endoscopic vein harvesting from the right leg.   SURGEON:  Evelene Croon, M.D.   ASSISTANT:  Kerin Perna, M.D.   SECOND ASSISTANT:  Jerold Coombe, P.A.-C.   ANESTHESIA:  General endotracheal anesthesia.   CLINICAL HISTORY:  The patient is a 50 year old gentleman with a history of  hypertension, hyperlipidemia, and a strong family history of heart disease,  who suffered an acute non-Q wave myocardial infarction while visiting in  [place] last week.  Cardiac catheterization there showed severe three vessel  disease.  The LAD had about 80% stenosis after the first diagonal branch.  It was occluded near the apex.  There was a moderate size diagonal branch.  The LAD proximal to the diagonal branch had about 50-60% stenosis.  The left  circumflex gave off two marginal branches which both had  80-90% ostial  stenosis.  The right coronary artery was diffusely diseased with multiple 80-  90% stenoses.  There was a large posterior descending branch that bifurcated  early and two sub-branches.  Bifurcation had high grade stenosis.  There  were two small posterolateral branches which were diffusely diseased.  Left  ventricular ejection fraction was about 30% with marked left ventricular  dilatation and global hypokinesis.  There was 1-2+ mitral regurgitation.  An  echocardiogram had shown ejection fraction about 30-35% with 2+ mitral  regurgitation.  The patient was discharged from the hospital in [place] and  returned to [facility] by airplane and called his primary physician who  sent him to my office yesterday.  After review of the studies and  examination of the patient, it was felt that urgent coronary artery bypass  graft surgery is the best treatment to prevent further ischemia and  infarction.  I discussed the operative procedure with the patient and his  wife including alternatives, benefits, and risks, including bleeding, blood  transfusion, infection, stroke, myocardial infarction, and death.  They  understood and agreed to proceed.   OPERATIVE PROCEDURE:  The patient was taken to the operating room  and placed  on the table in supine position.  After induction of general endotracheal  anesthesia, a Foley catheter was placed in the bladder using sterile  technique.  The chest, abdomen, and both lower extremities were prepped and  draped in the usual sterile manner.  The chest was entered through a median  sternotomy incision and the pericardium opened in the midline.  Examination  of the heart showed a large heart with normal right ventricular function and  hypokinesis of the anterior wall.  The ascending aorta had no palpable  plaques in it.  Transesophageal echocardiogram was performed by  anesthesiology and showed global hypokinesis with a dilated ventricle.   Ejection fraction was about 30%.  There was 1+ mitral regurgitation.  This  was felt to be most likely due to some annular dilatation.  Given the  patient's low ejection fraction and 1+ mitral regurgitation, I felt that it  would be best not to perform mitral valve repair which would prolong his  surgery significantly.   Then, the left internal mammary artery was harvested from the chest wall as  a pedicle graft.  This was a medium caliber vessel with excellent blood flow  through it.  At the same time, a segment of greater saphenous vein was  harvested from the right leg using endoscopic vein harvest technique.  This  vein was of medium size and good quality.  Then, the patient was heparinized  and when an adequate activated clotting time was achieved, the distal  ascending aorta was cannulated using a 20 French aortic cannula for arterial  in flow.  Venous outflow was achieved using two staged venous cannula  through the right atrial appendage.  An antegrade cardioplegia and vent  cannula was inserted in the aortic root.   The patient was placed on cardiopulmonary bypass and the distal coronaries  were identified.  He had severe, diffuse coronary disease.  The LAD was  severely diffusely diseased and extended out to the apex of the heart.  There was one area in the mid portion which was soft enough to sew a graft  into.  The diagonal branch was heavily diseased proximally and then had  patchy plaque in the distal vessel.  The two intermediate vessels were both  intramyocardial and were located proximally where they appeared free of  disease.  The right coronary artery was diffusely diseased.  There were twin  posterior descending branches.  Both of these were heavily diseased  proximally and were located in their mid portions where they were still  suitable size.  They were relatively free of disease in this area.  The two posterolateral branches were small, diffusely diseased,  non-graftable  vessels.   Then, the aorta was crossclamped and 1000 mL of cold blood antegrade  cardioplegia was administered in the aortic root with quick arrest of the  heart.  Systemic hypothermia to 20 degrees Centigrade and topical  hypothermia with iced saline was used.  A temperature probe was placed in  the septum and insulated pad in the pericardium. The first distal  anastomosis was performed to the first marginal branch.  The internal  diameter was about 1.75 mm.  The conduit used was a segment of greater  saphenous vein.  The anastomosis was performed in a sequential side-to-side  fashion using a continuous 7-0 Prolene suture.  Flow was measured in the  graft and was excellent.  The second distal anastomosis was performed to the  second marginal diagonal branch.  The internal diameter was also about 1.75  mm.  The conduit used was the same segment of greater saphenous vein and the  anastomosis was performed in a sequential end-to-side fashion using a  continuous 7-0 Prolene suture.  Flow was measured through the graft and was  excellent.  Another dose of cardioplegia was given down the vein graft and  in the aortic root.  The third distal anastomosis was performed to the  posterior descending coronary.  The internal diameter of this vessel was  about 1.75 mm.  The conduit used was a second segment of greater saphenous  vein.  The anastomosis was performed in a sequential side-to-side manner  using a continuous 7-0 Prolene suture.  Flow was measured in the graft and  was excellent.  The fourth distal anastomosis was performed to the second  posterior descending branch.  The internal diameter of this vessel was about  1.6 mm.  The conduit used was the same segment of greater saphenous vein and  the anastomosis was performed in a sequential end-to-side manner using a  continuous 7-0 Prolene suture.  Flow was measured through the graft and was  excellent.  The fifth distal  anastomosis was performed to the acute marginal  branch.  The internal diameter was about 1.6 mm.  The conduit used was the  same segment of greater saphenous vein.  The anastomosis was performed in a  sequential side-to-side manner using a continuous 7-0 Prolene suture.  Flow  was measured through the graft and was excellent.  Then, another dose of  cardioplegia was given down the vein graft and the aortic root.  The sixth  distal anastomosis was performed to the diagonal branch.  The internal  diameter was about 1.6 mm.  The conduit used was the third segment of  greater saphenous vein and the anastomosis was performed in an end-to-side  manner using continuous 7-0 Prolene suture.  Flow was measured through the  graft and was excellent.  The seventh distal anastomosis was performed to  the mid portion of the left anterior descending coronary artery.  The  internal diameter of this area was about 1.6 mm.  The conduit used was the left internal mammary graft and this was brought through an opening in the  left pericardium anterior to the phrenic nerve.  It was anastomosed to the  LAD in an end-to-side manner using a continuous 8-0 Prolene suture.  The  pedicle was tacked to the epicardium with 6-0 Prolene sutures.   The patient was rewarmed to 37 degrees Centigrade.  Then, with the Cross  clamp in place, the three proximal vein graft anastomoses were performed in  an end-to-side fashion using continuous 6-0 Prolene suture.  The clamp was  then removed from the mammary pedicle.  There was rapid rewarming of the  ventricular septum and return of spontaneous ventricular fibrillation.  The  Cross clamp was removed with a time of 114 minutes.  The patient  spontaneously converted to sinus rhythm.  The proximal and distal  anastomoses appeared hemostatic and the lie of the grafts were satisfactory.  Graft markers were placed around the proximal anastomoses.  Two temporary  right ventricular and  right atrial pacing wires were placed and brought  through the skin.  When the patient had been rewarmed to 37 degrees  Centigrade, he was weaned from cardiopulmonary bypass on low dose Dopamine.  Total bypass time 135 minutes.  Cardiac function appeared good with a  cardiac output of  5.5 liters per minute.  Transesophageal echocardiogram was  performed and showed the most significant change in left ventricular  function.  The mitral valve appeared unchanged with 1+ mitral regurgitation.  Then, protamine was given and the venous and aortic cannulae were removed  without difficulty.  Hemostasis was achieved.  Three chest tubes were  placed, one in the posterior pericardium, one in the left pleural space, and  one in the anterior mediastinum.  The pericardium was reapproximated over  the heart.  The sternum was closed with #6 stainless steel wires.  The  fascia was closed with continuous #1 Vicryl suture.  The subcutaneous tissue  was closed with continuous 2-0 Vicryl and the skin with 3-0 Vicryl  subcuticular closure.  The lower extremity vein harvest site was closed in  layers in a similar manner.  The sponge, needle, and instrument counts were  correct according to the scrub nurse.  Dry, sterile dressings were applied  to the incision and around the chest tubes which were hooked to Pleuravac  suction.  The patient remained hemodynamically stable and was transferred to  the SICU in guarded but stable condition.       BB/MEDQ  D:  06/29/2005  T:  06/29/2005  Job:  [id number]

## 2011-05-12 NOTE — Discharge Summary (Signed)
NAME:  Chad [name], Chad [name] NO.:  [id number]   MEDICAL RECORD NO.:  [id number]          PATIENT TYPE:  INP   LOCATION:  2012                         FACILITY:  [facility]   PHYSICIAN:  Chad [name], M.D.  DATE OF BIRTH:  Nov 30, 1961   DATE OF ADMISSION:  11/24/2005  DATE OF DISCHARGE:  11/25/2005                                 DISCHARGE SUMMARY   PRIMARY CARDIOLOGIST:  Chad Meres, MD, [facility].   PROCEDURES:  Guidant ICD implantation, November 24, 2005.   PRINCIPAL DIAGNOSIS:  Severe ischemic cardiomyopathy.  a.  History of Class III-IV heart failure.  b.  Recent ejection fraction 28% by perfusion imaging.  c.  Status post coronary artery bypass grafting, July 2006:  Patent grafts  by recent catheterization.   SECONDARY DIAGNOSES:  1.  Hypertension.  2.  Hyperlipidemia.  3.  Chronic renal insufficiency.  4.  History of hyperglycemia.  5.  CONTRAST DYE ALLERGY.   REASON FOR ADMISSION:  Chad [name] is a 50 year old male, with a history  of severe ischemic cardiomyopathy, recently referred to Dr. Lewayne [name] for  consideration of ICD implantation given recent finding of EF 28% by  perfusion imaging.   HOSPITAL COURSE:  The patient presented for elective ICD implantation on  December 1st, and underwent successful placement of a Guidant defibrillator  by Dr. Lewayne [name], with no noted complications.   Postop course was benign.  Defibrillator was checked prior to discharge, and  patient was cleared for discharge by Dr. Sherryl [name], in hemodynamically  stable condition.   The patient will resume all previous home medications.   DISCHARGE MEDICATIONS:  1.  Coated aspirin 325 mg daily.  2.  Potassium chloride 40 mEq daily.  3.  Atacand 32 mg daily.  4.  Lasix 80 mg b.i.d.  5.  Digoxin 0.25 mg daily.  6.  Lipitor 40 mg daily.  7.  Aldactone 25 mg daily.  8.  Toprol-XL 200 mg daily.  9.  Plavix 75 mg daily.   INSTRUCTIONS:  1.  Follow up [name] in 10-14 days.  2.  Follow up with Dr. Randa [name] in 4-6 weeks.  3.  Follow up with Dr. Lewayne [name] in 3 months.   DISCHARGE DURATION:  26 minutes.      Chad [name], P.A. [facility]    ______________________________  Chad [name], M.D.    Chad [name]  D:  11/25/2005  T:  11/26/2005  Job:  [id number]

## 2011-05-17 ENCOUNTER — Ambulatory Visit (INDEPENDENT_AMBULATORY_CARE_PROVIDER_SITE_OTHER): Payer: Medicaid Other

## 2011-05-17 ENCOUNTER — Telehealth: Payer: Self-pay | Admitting: *Deleted

## 2011-05-17 DIAGNOSIS — I498 Other specified cardiac arrhythmias: Secondary | ICD-10-CM

## 2011-05-17 NOTE — Telephone Encounter (Signed)
Mr Bagby here for repeat EKG--appears to be in sinus bradycardia--heart rate is 52--reviewed by  scott weaver, and file copied into chart--nt

## 2011-05-18 ENCOUNTER — Other Ambulatory Visit: Payer: Self-pay | Admitting: *Deleted

## 2011-05-18 DIAGNOSIS — I25810 Atherosclerosis of coronary artery bypass graft(s) without angina pectoris: Secondary | ICD-10-CM

## 2011-06-12 ENCOUNTER — Ambulatory Visit (INDEPENDENT_AMBULATORY_CARE_PROVIDER_SITE_OTHER): Payer: Medicaid Other | Admitting: Internal Medicine

## 2011-06-12 ENCOUNTER — Encounter: Payer: Self-pay | Admitting: Internal Medicine

## 2011-06-12 ENCOUNTER — Other Ambulatory Visit: Payer: Medicaid Other | Admitting: *Deleted

## 2011-06-12 ENCOUNTER — Encounter (INDEPENDENT_AMBULATORY_CARE_PROVIDER_SITE_OTHER): Payer: Self-pay | Admitting: Surgery

## 2011-06-12 VITALS — BP 150/80 | HR 71 | Resp 14 | Ht 70.0 in | Wt 197.0 lb

## 2011-06-12 DIAGNOSIS — E785 Hyperlipidemia, unspecified: Secondary | ICD-10-CM

## 2011-06-12 DIAGNOSIS — I2510 Atherosclerotic heart disease of native coronary artery without angina pectoris: Secondary | ICD-10-CM | POA: Insufficient documentation

## 2011-06-12 DIAGNOSIS — R079 Chest pain, unspecified: Secondary | ICD-10-CM

## 2011-06-12 DIAGNOSIS — I10 Essential (primary) hypertension: Secondary | ICD-10-CM

## 2011-06-12 MED ORDER — VARDENAFIL HCL 20 MG PO TABS: 20.0000 mg | ORAL_TABLET | Freq: Every day | ORAL | Status: DC | PRN

## 2011-06-12 MED ORDER — ATORVASTATIN CALCIUM 40 MG PO TABS: 40.0000 mg | ORAL_TABLET | Freq: Every day | ORAL | Status: DC

## 2011-06-12 NOTE — Assessment & Plan Note (Signed)
BP elevated here but well controlled at home. Restarting lisinopril.

## 2011-06-12 NOTE — Assessment & Plan Note (Signed)
Given CP will get ETT/Myoview prior to surgery to risk stratify. 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

## 2011-06-12 NOTE — Patient Instructions (Signed)
Start Atorvastatin 40 mg, only take 1/2 tab every other day We have given you a prescription for Levitra 20 mg  Your physician has requested that you have en exercise stress myoview. For further information please visit [email or web address] Please follow instruction sheet, as given.  Your physician wants you to follow-up in: 4 months.  You will receive a reminder letter in the mail two months in advance. If you don't receive a letter, please call our office to schedule the follow-up appointment.

## 2011-06-12 NOTE — Assessment & Plan Note (Signed)
Stable. NYHA I-II. Told him to restart lisinopril. Volume status OK.

## 2011-06-12 NOTE — Assessment & Plan Note (Signed)
Long talk about need to get LDL as low as possible. Will switch back to atorvastatin 20 qod and try to titrate up. Goal LDL < 70.

## 2011-06-12 NOTE — Progress Notes (Signed)
History of Present Illness: Primary Cardiologist:  Dr. Arvilla Meres  Chad [name] is a 50 y.o. male with history of severe HTN, coronary artery disease status post previous myocardial infarction and bypass surgery in 2006 with DES to native PDA in 2011.  He also has a history congestive heart failure secondary to ischemic cardiomyopathy with EF in 30-35% range.  He is s/p single chamber ICD.  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 He underwent emergent repair of proximal aorta and reimplantation of his CABG grafts.  Carotids u/s in 2011 0-39% bilaterally with highly vascular lesion in left thyroid lobe.  Biopsy showed Hurthle cell lesion.  Saw [name] who is planning sub-total thyroidectomy on 6/26.   He was admitted 4/23-4/24 with chest pain.  Myocardial infarction was ruled out.  CP was felt to be musculoskeletal.  Pravastatin was added to his medications.  He has been intolerant to Zocor and Lipitor in the past.  He returns for follow up.  Says he gets CP almost every day. Last 5-10 mins. Comes on at any time. Responds to NTG/  He denies orthopnea, PND or edema.  No weight gain.  No syncope or recent near syncope.  SBP typically < 130 at home.   Not taking pravastatin or lisinopril. Last LDL 107. HDL 51.   Past Medical History  Diagnosis Date  . CAD (coronary artery disease)     a. s/p CABG 2006;  b. DES to PDA 2011 (cath: Dx not seen, dRCA/PDA tx with DES; S-PDA occluded (culprit), S-Dx occluded, S-RI and OM ok, L-LAD ok  . HTN (hypertension)     severe  . Chronic systolic heart failure     a. echo 8/11: EF 35%, mod LVH, Grade 1 diast dysfxn, mild AI, mild MR, inf and post HK  . 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 CRI (chronic renal insufficiency)   . Thyroid cancer     Hertle Cell  . HLD (hyperlipidemia)   . Anemia   . Gout   . AICD (automatic cardioverter/defibrillator) present   .  Carotid stenosis     dopplers 2011: 0-39% bilat.    Current Outpatient Prescriptions  Medication Sig Dispense Refill  . amLODipine (NORVASC) 10 MG tablet Take 10 mg by mouth daily.        [name] amoxicillin (AMOXIL) 500 MG capsule Take 4 caps 1 hour before dental appointment.      [name] aspirin 325 MG tablet Take 325 mg by mouth daily. Patient splits tablet in fourths       . B Complex-C (B-COMPLEX WITH VITAMIN C) tablet Take 1 tablet by mouth daily.        . carvedilol (COREG) 25 MG tablet Take 1 tablet (25 mg total) by mouth 2 (two) times daily.  60 tablet  11  . cetirizine (ZYRTEC) 10 MG tablet Take 10 mg by mouth daily.        . clopidogrel (PLAVIX) 75 MG tablet Take 1 tablet (75 mg total) by mouth daily.  30 tablet  6  . digoxin (LANOXIN) 0.25 MG tablet Take 1 tablet (250 mcg total) by mouth daily. TAKE 1/2 (HALF) TABLET ONCE DAILY      . furosemide (LASIX) 80 MG tablet Take 1 tablet (80 mg total) by mouth as needed.  30 tablet  11  . hydrALAZINE (APRESOLINE) 50 MG tablet Take 50 mg by  mouth 3 (three) times daily.        [name] HYDROcodone-acetaminophen (NORCO) 10-325 MG per tablet Take 1 tablet by mouth every 6 (six) hours as needed. May take every 4 to 6 hours as needed for severe pain       . isosorbide mononitrate (IMDUR) 30 MG 24 hr tablet Take 1 tablet (30 mg total) by mouth daily.  30 tablet  6  . lisinopril (PRINIVIL,ZESTRIL) 20 MG tablet Take 1 tablet (20 mg total) by mouth daily.  30 tablet  6  . nitroGLYCERIN (NITROSTAT) 0.4 MG SL tablet Place 1 tablet (0.4 mg total) under the tongue every 5 (five) minutes as needed.  25 tablet  6  . potassium chloride SA (K-DUR,KLOR-CON) 20 MEQ tablet Take 1 tablet (20 mEq total) by mouth daily. Take with lasix, take at least 1 a mth.  30 tablet  11  . pravastatin (PRAVACHOL) 20 MG tablet Take 20 mg by mouth daily.          Allergies  Allergen Reactions  . Iohexol      Desc: PT HAS ANAPHYLAXIS WITH CONTRAST MEDIA!, Onset Date: 16109604   Code: SOB,  Desc: ok w/ 13hr prep//a.c., Onset Date: 54098119   . Latex     REACTION: Reaction not known  . Sulfonamide Derivatives     Vital Signs: BP 150/80  Pulse 71  Resp 14  Ht 5\' 10"  (1.778 m)  Wt 197 lb (89.359 kg)  BMI 28.27 kg/m2  PHYSICAL EXAM: Well nourished, well developed, in no acute distress HEENT: normal Neck: no JVD Cardiac:  normal S1, S2; RRR; +s4 2/6 SEM RSB Lungs:  clear to auscultation bilaterally, no wheezing, rhonchi or rales Abd: soft, nontender, no hepatomegaly Ext: no edema Skin: warm and dry Neuro:  CNs 2-12 intact, no focal abnormalities noted  EKG:   Sinus rhythm, heart rate 71, left axis deviation, T wave inversions in leads 2, 3, aVF and V4-V6, LVH  ASSESSMENT AND PLAN:

## 2011-06-14 ENCOUNTER — Ambulatory Visit (HOSPITAL_COMMUNITY): Payer: Medicaid Other | Attending: Internal Medicine | Admitting: Radiology

## 2011-06-14 VITALS — Ht 70.0 in | Wt 192.0 lb

## 2011-06-14 DIAGNOSIS — I25810 Atherosclerosis of coronary artery bypass graft(s) without angina pectoris: Secondary | ICD-10-CM

## 2011-06-14 DIAGNOSIS — R55 Syncope and collapse: Secondary | ICD-10-CM

## 2011-06-14 DIAGNOSIS — R0789 Other chest pain: Secondary | ICD-10-CM

## 2011-06-14 DIAGNOSIS — R079 Chest pain, unspecified: Secondary | ICD-10-CM

## 2011-06-14 DIAGNOSIS — R0609 Other forms of dyspnea: Secondary | ICD-10-CM

## 2011-06-14 MED ORDER — ADENOSINE (DIAGNOSTIC) 3 MG/ML IV SOLN
0.5600 mg/kg | Freq: Once | INTRAVENOUS | Status: AC
  Administered 2011-06-14: 48.9 mg via INTRAVENOUS

## 2011-06-14 MED ORDER — TECHNETIUM TC 99M TETROFOSMIN IV KIT
32.4000 | PACK | Freq: Once | INTRAVENOUS | Status: AC | PRN
  Administered 2011-06-14: 32 via INTRAVENOUS

## 2011-06-14 MED ORDER — TECHNETIUM TC 99M TETROFOSMIN IV KIT
10.7000 | PACK | Freq: Once | INTRAVENOUS | Status: AC | PRN
  Administered 2011-06-14: 11 via INTRAVENOUS

## 2011-06-14 MED ORDER — REGADENOSON 0.4 MG/5ML IV SOLN: 0.4000 mg | Freq: Once | INTRAVENOUS | Status: DC

## 2011-06-14 NOTE — Progress Notes (Addendum)
[facility] SITE 3 NUCLEAR MED [address] [place] [address] [phone]  Cardiology Nuclear Med Chad [name] is a 50 y.o. male [id number] [birth year]   Nuclear Med Background Indication for Stress Test:  Evaluation for Ischemia, Stent/Graft Patency and Surgical Clearance for Thyroidectomy on 06/20/11 by Dr. Darnell Level History:  '06 MI>CABG, '06 AICD, '10 Aortic dissection, '10 [id number] infarct with mild antero-apical ischemia, EF=31%, 8/11 MI>Stent x 2-PDA/RCA, 8/11 Echo:EF=35%  Cardiac Risk Factors: Carotid Disease, Family History - CAD, Hypertension and Lipids  Symptoms:  Chest Pressure/Tightness (last episode of chest discomfort was while under the camera for resting images, none now), Diaphoresis, Dizziness, DOE/SOB, Fatigue, Near Syncope and Palpitations    Nuclear Pre-Procedure Caffeine/Decaff Intake:  None NPO After: 9:00pm   Lungs:  Clear.  O2 Sat 96% on RA. IV 0.9% NS with Angio Cath:  20g  IV Site: R Antecubital  IV Started by:  Bonnita Levan, RN  Chest Size (in):  40 Cup Size: n/a  Height: 5\' 10"  (1.778 m)  Weight:  192 lb (87.091 kg)  BMI:  Body mass index is 27.55 kg/(m^2). Tech Comments:  No meds today, last dose of Coreg was last night @ 10 pm, changed to adenosine.  The patient complained of left sided chest pain, 5/10, prior to rest images that subsided quickly after lying down, BP 120/80 HR 63 O2 Sat 97% RA.  Patsy Edwards,RN    Nuclear Med Study 1 or 2 day study: 1 day  Stress Test Type:  Adenosine  Reading MD: Willa Rough, MD  Order Authorizing Provider:  Dr. Algis Downs. Bensimhon  Resting Radionuclide: Technetium 59m Tetrofosmin  Resting Radionuclide Dose: 10.7 mCi   Stress Radionuclide:  Technetium 41m Tetrofosmin  Stress Radionuclide Dose: 32.4 mCi           Stress Protocol Rest HR: 58 Stress HR: 77  Rest BP: 129/85 Stress BP: 130/74  Exercise Time (min): n/a METS: n/a   Predicted Max HR: 172 bpm % Max HR: 44.77  bpm Rate Pressure Product: 35573   Dose of Adenosine (mg):  48.9 Dose of Lexiscan: n/a mg  Dose of Atropine (mg): n/a Dose of Dobutamine: n/a mcg/kg/min (at max HR)  Stress Test Technologist: Smiley Houseman, CMA-N  Nuclear Technologist:  Doyne Keel, CNMT     Rest Procedure:  Myocardial perfusion imaging was performed at rest 45 minutes following the intravenous administration of Technetium 53m Tetrofosmin.  Rest ECG: LVH with strain, rare PVC.  Stress Procedure:  The patient received IV adenosine at 140 mcg/kg/min for 4 minutes.  There were no significant changes with infusion, only occasional PVC's.  Technetium 70m Tetrofosmin was injected at the 2 minute mark and quantitative spect images were obtained after a 45 minute delay.  Stress ECG: No significant change from baseline ECG  QPS Raw Data Images:  Normal; no motion artifact; normal heart/lung ratio. Stress Images:  Moderate decreased activity in a moderately large area of the inferior segments. Very slight decrease in the mid anterior wall. Rest Images:  The same decreased activity in the inferior wall. Anterior perfusion is normal. Subtraction (SDS):  No evidence of ischemia. Transient Ischemic Dilatation (Normal <1.22):  1.26 Lung/Heart Ratio (Normal <0.45):  .28  Quantitative Gated Spect Images QGS EDV:  254 ml QGS ESV:  166 ml QGS cine images:  Severe inferior and septal hypokinesis. QGS EF: 35%  Impression Exercise Capacity:  Adenosine study with no exercise. BP Response:  Normal blood pressure response. Clinical  Symptoms:  chest pressure ECG Impression:  No significant ST segment change suggestive of ischemia. Comparison with Prior Nuclear Study: No significant change from previous study  Overall Impression:  There is old inferior scar with no ischemia in this area. There is old LV dysfunction. There is question of mild decreased activity in the mid anterior wall with slight reversibility. This would raise the  question of some ischemia in this area. The quantitative analysis does not show this area. The images were the same in 10/2009.    Willa Rough   Stable. Continue med therapy.  Daniel Bensimhon

## 2011-06-15 NOTE — Progress Notes (Signed)
Copy nuc. med report routed to Dr.Bensimhon 06/15/11 Domenic Polite

## 2011-06-16 ENCOUNTER — Other Ambulatory Visit (INDEPENDENT_AMBULATORY_CARE_PROVIDER_SITE_OTHER): Payer: Self-pay | Admitting: Surgery

## 2011-06-16 ENCOUNTER — Ambulatory Visit (HOSPITAL_COMMUNITY)
Admission: RE | Admit: 2011-06-16 | Discharge: 2011-06-16 | Disposition: A | Discharge status: Home / Self Care | Payer: Medicaid Other | Source: Ambulatory Visit | Attending: Surgery | Admitting: Surgery

## 2011-06-16 ENCOUNTER — Encounter (HOSPITAL_COMMUNITY)
Admission: RE | Admit: 2011-06-16 | Discharge: 2011-06-16 | Disposition: A | Discharge status: Home / Self Care | Payer: Medicaid Other | Source: Ambulatory Visit | Attending: Surgery | Admitting: Surgery

## 2011-06-16 ENCOUNTER — Telehealth: Payer: Self-pay | Admitting: Internal Medicine

## 2011-06-16 DIAGNOSIS — E049 Nontoxic goiter, unspecified: Secondary | ICD-10-CM | POA: Insufficient documentation

## 2011-06-16 DIAGNOSIS — E041 Nontoxic single thyroid nodule: Secondary | ICD-10-CM

## 2011-06-16 DIAGNOSIS — Z01812 Encounter for preprocedural laboratory examination: Secondary | ICD-10-CM | POA: Insufficient documentation

## 2011-06-16 DIAGNOSIS — Z01818 Encounter for other preprocedural examination: Secondary | ICD-10-CM | POA: Insufficient documentation

## 2011-06-16 DIAGNOSIS — Z95810 Presence of automatic (implantable) cardiac defibrillator: Secondary | ICD-10-CM | POA: Insufficient documentation

## 2011-06-16 LAB — URINALYSIS, ROUTINE W REFLEX MICROSCOPIC
Ketones, ur: NEGATIVE mg/dL
Leukocytes, UA: NEGATIVE
Nitrite: NEGATIVE
Protein, ur: NEGATIVE mg/dL
Urobilinogen, UA: 1 mg/dL (ref 0.0–1.0)

## 2011-06-16 LAB — BASIC METABOLIC PANEL
BUN: 15 mg/dL (ref 6–23)
Calcium: 9.5 mg/dL (ref 8.4–10.5)
Creatinine, Ser: 1.43 mg/dL — ABNORMAL HIGH (ref 0.50–1.35)
GFR calc non Af Amer: 53 mL/min — ABNORMAL LOW (ref >60)
Glucose, Bld: 101 mg/dL — ABNORMAL HIGH (ref 70–99)

## 2011-06-16 LAB — CBC
Hemoglobin: 13.9 g/dL (ref 13.0–17.0)
MCH: 27.4 pg (ref 26.0–34.0)
MCHC: 34.2 g/dL (ref 30.0–36.0)
RDW: 13.8 % (ref 11.5–15.5)

## 2011-06-16 LAB — DIFFERENTIAL
Basophils Absolute: 0 10*3/uL (ref 0.0–0.1)
Eosinophils Absolute: 0.2 10*3/uL (ref 0.0–0.7)
Lymphs Abs: 1.7 10*3/uL (ref 0.7–4.0)
Monocytes Absolute: 0.4 10*3/uL (ref 0.1–1.0)
Neutrophils Relative %: 27 % — ABNORMAL LOW (ref 43–77)

## 2011-06-16 NOTE — Telephone Encounter (Signed)
Faxed OV, EKG & Echo to [facility] (per [place]) @ [facility] ([phone]).

## 2011-06-20 ENCOUNTER — Inpatient Hospital Stay (HOSPITAL_COMMUNITY)
Admission: RE | Admit: 2011-06-20 | Discharge: 2011-06-21 | DRG: 627 | Disposition: A | Discharge status: Home / Self Care | Payer: Medicaid Other | Source: Ambulatory Visit | Attending: Surgery | Admitting: Surgery

## 2011-06-20 ENCOUNTER — Other Ambulatory Visit (INDEPENDENT_AMBULATORY_CARE_PROVIDER_SITE_OTHER): Payer: Self-pay | Admitting: Surgery

## 2011-06-20 DIAGNOSIS — Z79899 Other long term (current) drug therapy: Secondary | ICD-10-CM

## 2011-06-20 DIAGNOSIS — D449 Neoplasm of uncertain behavior of unspecified endocrine gland: Secondary | ICD-10-CM

## 2011-06-20 DIAGNOSIS — E041 Nontoxic single thyroid nodule: Principal | ICD-10-CM | POA: Diagnosis present

## 2011-06-20 DIAGNOSIS — Z7902 Long term (current) use of antithrombotics/antiplatelets: Secondary | ICD-10-CM

## 2011-06-20 HISTORY — PX: THYROIDECTOMY, PARTIAL: SHX18

## 2011-06-27 ENCOUNTER — Telehealth (INDEPENDENT_AMBULATORY_CARE_PROVIDER_SITE_OTHER): Payer: Self-pay

## 2011-06-27 ENCOUNTER — Emergency Department (HOSPITAL_COMMUNITY)
Admission: EM | Admit: 2011-06-27 | Discharge: 2011-06-27 | Disposition: A | Discharge status: Home / Self Care | Payer: Medicaid Other | Attending: Emergency Medicine | Admitting: Emergency Medicine

## 2011-06-27 ENCOUNTER — Emergency Department (HOSPITAL_COMMUNITY): Payer: Medicaid Other

## 2011-06-27 DIAGNOSIS — Z951 Presence of aortocoronary bypass graft: Secondary | ICD-10-CM | POA: Insufficient documentation

## 2011-06-27 DIAGNOSIS — I2510 Atherosclerotic heart disease of native coronary artery without angina pectoris: Secondary | ICD-10-CM | POA: Insufficient documentation

## 2011-06-27 DIAGNOSIS — R110 Nausea: Secondary | ICD-10-CM | POA: Insufficient documentation

## 2011-06-27 DIAGNOSIS — R0989 Other specified symptoms and signs involving the circulatory and respiratory systems: Secondary | ICD-10-CM | POA: Insufficient documentation

## 2011-06-27 DIAGNOSIS — R079 Chest pain, unspecified: Secondary | ICD-10-CM | POA: Insufficient documentation

## 2011-06-27 DIAGNOSIS — I252 Old myocardial infarction: Secondary | ICD-10-CM | POA: Insufficient documentation

## 2011-06-27 DIAGNOSIS — I10 Essential (primary) hypertension: Secondary | ICD-10-CM | POA: Insufficient documentation

## 2011-06-27 DIAGNOSIS — Z79899 Other long term (current) drug therapy: Secondary | ICD-10-CM | POA: Insufficient documentation

## 2011-06-27 DIAGNOSIS — R0609 Other forms of dyspnea: Secondary | ICD-10-CM | POA: Insufficient documentation

## 2011-06-27 DIAGNOSIS — R0602 Shortness of breath: Secondary | ICD-10-CM | POA: Insufficient documentation

## 2011-06-27 DIAGNOSIS — Z7982 Long term (current) use of aspirin: Secondary | ICD-10-CM | POA: Insufficient documentation

## 2011-06-27 LAB — CBC
HCT: 38.6 % — ABNORMAL LOW (ref 39.0–52.0)
MCHC: 34.5 g/dL (ref 30.0–36.0)
MCV: 79.6 fL (ref 78.0–100.0)
RDW: 13.6 % (ref 11.5–15.5)

## 2011-06-27 LAB — COMPREHENSIVE METABOLIC PANEL
Albumin: 4 g/dL (ref 3.5–5.2)
Alkaline Phosphatase: 60 U/L (ref 39–117)
BUN: 19 mg/dL (ref 6–23)
Calcium: 8.5 mg/dL (ref 8.4–10.5)
Potassium: 3.5 mEq/L (ref 3.5–5.1)
Sodium: 140 mEq/L (ref 135–145)
Total Protein: 7.6 g/dL (ref 6.0–8.3)

## 2011-06-27 LAB — DIGOXIN LEVEL: Digoxin Level: 1.2 ng/mL (ref 0.8–2.0)

## 2011-06-27 LAB — CK TOTAL AND CKMB (NOT AT ARMC)
CK, MB: 1.9 ng/mL (ref 0.3–4.0)
Relative Index: 1.7 (ref 0.0–2.5)

## 2011-06-27 LAB — TROPONIN I: Troponin I: <0.3 ng/mL (ref <0.30)

## 2011-07-04 ENCOUNTER — Ambulatory Visit (INDEPENDENT_AMBULATORY_CARE_PROVIDER_SITE_OTHER): Payer: Medicaid Other | Admitting: Internal Medicine

## 2011-07-04 ENCOUNTER — Encounter: Payer: Self-pay | Admitting: Family Medicine

## 2011-07-04 ENCOUNTER — Encounter: Payer: Self-pay | Admitting: Internal Medicine

## 2011-07-04 ENCOUNTER — Ambulatory Visit (INDEPENDENT_AMBULATORY_CARE_PROVIDER_SITE_OTHER): Payer: Medicaid Other | Admitting: Family Medicine

## 2011-07-04 VITALS — BP 137/80 | HR 73 | Wt 143.0 lb

## 2011-07-04 DIAGNOSIS — I428 Other cardiomyopathies: Secondary | ICD-10-CM

## 2011-07-04 DIAGNOSIS — I5022 Chronic systolic (congestive) heart failure: Secondary | ICD-10-CM

## 2011-07-04 DIAGNOSIS — C73 Malignant neoplasm of thyroid gland: Secondary | ICD-10-CM

## 2011-07-04 DIAGNOSIS — I10 Essential (primary) hypertension: Secondary | ICD-10-CM

## 2011-07-04 DIAGNOSIS — Z95810 Presence of automatic (implantable) cardiac defibrillator: Secondary | ICD-10-CM

## 2011-07-04 DIAGNOSIS — M5430 Sciatica, unspecified side: Secondary | ICD-10-CM

## 2011-07-04 LAB — ICD DEVICE OBSERVATION
BATTERY VOLTAGE: 2.58 V
BRDY-0002RV: 40 {beats}/min
CHARGE TIME: 10.8 s
DEVICE MODEL ICD: 122638
FVT: 0
PACEART VT: 0
TOT-0001: 2
TOT-0006: 20120416000000
TZAT-0001FASTVT: 2
TZAT-0001SLOWVT: 1
TZAT-0004FASTVT: 4
TZAT-0004SLOWVT: 4
TZAT-0004SLOWVT: 4
TZAT-0005FASTVT: 81 pct
TZAT-0005SLOWVT: 81 pct
TZAT-0005SLOWVT: 81 pct
TZAT-0012FASTVT: 200 ms
TZAT-0012SLOWVT: 200 ms
TZAT-0012SLOWVT: 200 ms
TZAT-0013FASTVT: 1
TZAT-0013SLOWVT: 1
TZAT-0018FASTVT: NEGATIVE
TZAT-0018FASTVT: NEGATIVE
TZON-0003SLOWVT: 325 ms
TZST-0001FASTVT: 3
TZST-0001FASTVT: 5
TZST-0001FASTVT: 6
TZST-0001SLOWVT: 4
TZST-0001SLOWVT: 5
TZST-0001SLOWVT: 6
TZST-0003FASTVT: 31 J
TZST-0003FASTVT: 31 J
TZST-0003FASTVT: 31 J
TZST-0003SLOWVT: 14 J
TZST-0003SLOWVT: 23 J
TZST-0003SLOWVT: 31 J
VENTRICULAR PACING ICD: 0 pct
VF: 0

## 2011-07-04 NOTE — Assessment & Plan Note (Signed)
Reasonable control at this time per Dr. Gala Romney.  No changes on my end planned.  Will follow.

## 2011-07-04 NOTE — Assessment & Plan Note (Signed)
His symptoms are class II. He'll continue his current medical therapy. I have asked him to maintain a low-sodium diet and to exercise regularly.
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
## 2011-07-04 NOTE — Assessment & Plan Note (Signed)
His blood pressure appears to be well controlled. He'll continue his current medical therapy. I've asked that he maintain a low-sodium diet.

## 2011-07-04 NOTE — Assessment & Plan Note (Signed)
Manage by Dr. Gerrit Friends. Has partial thyroidectomy in end of June. Stageing in process as is decisions on further management.  Plan to follow.

## 2011-07-04 NOTE — Progress Notes (Signed)
HPI Chad [name] returns today for followup. He is a very pleasant middle-aged man with an ischemic cardiomyopathy, chronic systolic heart failure, hypertension, and status post ICD implantation. He recently underwent direct surgery. He has been bothered by insomnia. He denies chest pain or shortness of breath. His heart failure is class II. He denies peripheral edema. He has had no syncope or recent ICD shock. Allergies  Allergen Reactions  . Iohexol      Desc: PT HAS ANAPHYLAXIS WITH CONTRAST MEDIA!, Onset Date: 16109604   Code: SOB, Desc: ok w/ 13hr prep//a.c., Onset Date: 54098119   . Latex     REACTION: Reaction not known  . Lipitor (Atorvastatin Calcium)   . Sulfonamide Derivatives   . Zocor (Simvastatin)      Current Outpatient Prescriptions  Medication Sig Dispense Refill  . amLODipine (NORVASC) 10 MG tablet Take 10 mg by mouth daily.        [name] amoxicillin (AMOXIL) 500 MG capsule Take 4 caps 1 hour before dental appointment.      [name] aspirin 325 MG tablet Take 325 mg by mouth daily. Patient splits tablet in fourths       . atorvastatin (LIPITOR) 40 MG tablet Take 20 mg by mouth every other day.        . B Complex-C (B-COMPLEX WITH VITAMIN C) tablet Take 1 tablet by mouth daily.        . carvedilol (COREG) 25 MG tablet Take 1 tablet (25 mg total) by mouth 2 (two) times daily.  60 tablet  11  . cetirizine (ZYRTEC) 10 MG tablet Take 10 mg by mouth daily.        . clopidogrel (PLAVIX) 75 MG tablet Take 1 tablet (75 mg total) by mouth daily.  30 tablet  6  . digoxin (LANOXIN) 0.25 MG tablet Take 250 mcg by mouth daily.       . furosemide (LASIX) 80 MG tablet Take 1 tablet (80 mg total) by mouth as needed.  30 tablet  11  . HYDROcodone-acetaminophen (NORCO) 10-325 MG per tablet Take 1 tablet by mouth every 6 (six) hours as needed. May take every 4 to 6 hours as needed for severe pain       . isosorbide mononitrate (IMDUR) 30 MG 24 hr tablet Take 1 tablet (30 mg total) by mouth daily.   30 tablet  6  . lisinopril (PRINIVIL,ZESTRIL) 20 MG tablet Take 1 tablet (20 mg total) by mouth daily.  30 tablet  6  . nitroGLYCERIN (NITROSTAT) 0.4 MG SL tablet Place 1 tablet (0.4 mg total) under the tongue every 5 (five) minutes as needed.  25 tablet  6  . potassium chloride SA (K-DUR,KLOR-CON) 20 MEQ tablet Take 20 mEq by mouth daily. 1 tab when pt takes lasix       . vardenafil (LEVITRA) 20 MG tablet Take 1 tablet (20 mg total) by mouth daily as needed for erectile dysfunction.  10 tablet  3  . hydrALAZINE (APRESOLINE) 25 MG tablet 2 tabs po bid       . DISCONTD: atorvastatin (LIPITOR) 40 MG tablet Take 1 tablet (40 mg total) by mouth daily.  30 tablet  11  . DISCONTD: pravastatin (PRAVACHOL) 20 MG tablet Take 20 mg by mouth daily.           Past Medical History  Diagnosis Date  . CAD (coronary artery disease)     a. s/p CABG 2006;  b. DES to PDA 2011 (cath: Dx not seen,  dRCA/PDA tx with DES; S-PDA occluded (culprit), S-Dx occluded, S-RI and OM ok, L-LAD ok  . HTN (hypertension)     severe  . Chronic systolic heart failure     a. echo 8/11: EF 35%, mod LVH, Grade 1 diast dysfxn, mild AI, mild MR, inf and post HK  . 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 CRI (chronic renal insufficiency)   . Thyroid cancer     Hertle Cell  . HLD (hyperlipidemia)   . Anemia   . Gout   . AICD (automatic cardioverter/defibrillator) present   . Carotid stenosis     dopplers 2011: 0-39% bilat.    ROS:   All systems reviewed and negative except as noted in the HPI.   Past Surgical History  Procedure Date  . Coronary artery bypass graft 2006  . Cardiac defibrillator placement     [employer]  . Thyroidectomy, partial 06/20/11  . Lumbar disc surgery      Family History  Problem Relation Age of Onset  . Coronary artery disease    . Hypertension Father   . Heart disease Father   . Early death Father   . COPD Father   . Hypertension Mother       History   Social History  . Marital Status: Divorced    Spouse Name: N/A    Number of Children: N/A  . Years of Education: N/A   Occupational History  . disabled    Social History Main Topics  . Smoking status: Never Smoker   . Smokeless tobacco: Never Used  . Alcohol Use: No  . Drug Use: No  . Sexually Active: Yes   Other Topics Concern  . Not on file   Social History Narrative  . No narrative on file     BP 127/83  Pulse 70  Ht 5\' 9"  (1.753 m)  Wt 193 lb (87.544 kg)  BMI 28.50 kg/m2  Physical Exam:  Well appearing NAD HEENT: Unremarkable Neck:  No JVD, thyromegally is present. A horizontal well-healed scars present. Lymphatics:  No adenopathy Back:  No CVA tenderness Lungs:  Clear. Well-healed ICD incision. HEART:  Regular rate rhythm, no murmurs, no rubs, no clicks Abd:  soft, positive bowel sounds, no organomegally, no rebound, no guarding Ext:  2 plus pulses, no edema, no cyanosis, no clubbing Skin:  No rashes no nodules Neuro:  CN II through XII intact, motor grossly intact  DEVICE  Normal device function.  See PaceArt for details.   Assess/Plan:

## 2011-07-04 NOTE — Patient Instructions (Addendum)
Your physician wants you to follow-up in: 12 months with Dr  Court Joy will receive a reminder letter in the mail two months in advance. If you don't receive a letter, please call our office to schedule the follow-up appointment. Your physician recommends that you schedule a follow-up appointment in: 3 months with the device clinic

## 2011-07-04 NOTE — Patient Instructions (Signed)
Thank you for coming in today. Lets get your set up for PT. Let me know if you have to pay a lot.  I will follow the pathology reports from the thyroid surgery as well as Dr. Reece Agar.  It makes sense for you to see me about every 3-4 months to make sure we are all on the same page.

## 2011-07-04 NOTE — Discharge Summary (Signed)
  NAMEKIETH, HARTIS NO.:  [id number]  MEDICAL RECORD NO.:  [id number]  LOCATION:  5152                         FACILITY:  [facility]  PHYSICIAN:  Velora Heckler, MD      DATE OF BIRTH:  1961-09-03  DATE OF ADMISSION:  06/20/2011 DATE OF DISCHARGE:  06/21/2011                              DISCHARGE SUMMARY   REASON FOR ADMISSION:  Left thyroid nodule.  BRIEF HISTORY:  The patient is a 50 year old black male followed for left thyroid nodule.  Fine needle aspiration biopsy showed cytologic atypia with Hurthle cell changes.  The patient comes to surgery for left thyroid lobectomy for definitive diagnosis.  HOSPITAL COURSE:  The patient was admitted on the day of surgery, June 20, 2011.  He was taken directly to the operating room where he underwent left thyroid lobectomy without complication.  Postoperative course was straightforward.  He remained hemodynamically stable.  He had good pain control.  He was prepared for discharge home on the morning following surgery.  DISCHARGE PLANNING:  The patient is discharged home today, June 21, 2011, in good condition, tolerating a regular diet, and ambulating independently.  DISCHARGE MEDICATIONS:  Vicodin as needed for pain.  The patient will return for followup in 2-3 weeks at [facility].  FINAL DIAGNOSIS:  Left thyroid nodule with cytologic atypia, final pathologic results pending at the time of discharge.  CONDITION AT DISCHARGE:  Good.     Velora Heckler, MD     TMG/MEDQ  D:  06/21/2011  T:  06/21/2011  Job:  [id number]  cc:   Bevelyn Buckles. Bensimhon, MD Doylene Canning. Ladona Ridgel, MD [facility]  Electronically Signed by Darnell Level MD on 07/04/2011 10:55:43 AM

## 2011-07-04 NOTE — Assessment & Plan Note (Signed)
His device is working normally. Will recheck in several months. 

## 2011-07-04 NOTE — Op Note (Signed)
NAMEMarland Kitchen  Chad [name], Chad [name] NO.:  [id number]  MEDICAL RECORD NO.:  [id number]  LOCATION:  5152                         FACILITY:  [facility]  PHYSICIAN:  Velora Heckler, MD      DATE OF BIRTH:  [birth year]  DATE OF PROCEDURE:  06/20/2011                               OPERATIVE REPORT   PREOPERATIVE DIAGNOSIS:  Left thyroid nodule with atypia.  POSTOPERATIVE DIAGNOSIS:  Left thyroid nodule with atypia.  PROCEDURE:  Left thyroid lobectomy.  SURGEON:  Velora Heckler, MD, FACS  ANESTHESIA:  General per Dr. Sheldon [name].  ESTIMATED BLOOD LOSS:  Minimal.  PREPARATION:  ChloraPrep.  COMPLICATIONS:  None.  INDICATIONS:  The patient is a 50 year old black male with unknown dominant left thyroid nodule.  Fine-needle aspiration biopsy showed cytologic atypia with Hurthle cell changes.  The patient now comes to surgery for resection for definitive diagnosis.  BODY OF REPORT:  Procedure was done in OR #2 at the [facility]  The patient was brought to the operating room, placed in supine position on the operating room table.  Following administration of general anesthesia, the patient was positioned and then prepped and draped in the usual strict aseptic fashion.  After ascertaining that, an adequate level of anesthesia had been achieved, a Kocher incision was made a #15 blade.  Dissection was carried through subcutaneous tissues and platysma.  Hemostasis was obtained with the electrocautery.  Skin flaps were elevated cephalad and caudad from the thyroid notch to the sternal notch.  A Mahorner self-retaining retractor was placed for exposure.  Strap muscles were incised in the midline and reflected to the left exposing the left thyroid lobe.  Left thyroid lobe was gently mobilized with blunt dissection.  Venous tributaries were divided between Ligaclips with the harmonic scalpel. Superior pole vessels were dissected out individually and divided between  medium Ligaclips with the Harmonic scalpel.  Superior parathyroid gland was identified and preserved on its vascular pedicle. Inferior venous tributaries were divided between medium Ligaclips with the harmonic scalpel.  Gland was rolled anteriorly.  Branches of the inferior thyroid artery were divided individually between small and medium Ligaclips with the harmonic scalpel.  Recurrent laryngeal nerve was identified and preserved.  Ligament of Chad [name] was transected with electrocautery and the gland was mobilized up and onto the anterior trachea.  Isthmus was mobilized across the midline.  A small pyramidal lobe was resected with the isthmus.  Isthmus was divided at its junction with the right thyroid lobe using the Harmonic scalpel for hemostasis. Suture was used to mark the left superior pole.  Left lobe was submitted to Pathology where it was reviewed by Dr. [place] [name].  Dr. Frederica [name] felt that the lesion was fully excised and would like to submit it for permanent sections only.  Neck is irrigated with warm saline.  Right lobe was palpated, and there are no dominant nodules in the right thyroid lobe.  Good hemostasis was noted throughout the field.  Surgicel was placed in the operative field. Strap muscles were reapproximated in the midline with interrupted 3-0 Vicryl sutures.  Platysma was closed with interrupted 3-0 Vicryl sutures.  Skin was closed with a running  4-0 Monocryl subcuticular suture.  Wound was washed and dried.  Benzoin and Steri-Strips were applied.  Sterile dressings were applied.  The patient was awakened from anesthesia and brought to the recovery room in stable condition.  The patient tolerated the procedure well.   Velora Heckler, MD, FACS     TMG/MEDQ  D:  06/20/2011  T:  06/20/2011  Job:  [id number]  cc:   Chad Buckles. Bensimhon, MD Chad Canning. Ladona Ridgel, MD  Electronically Signed by Darnell Level MD on 07/04/2011 10:56:04 AM

## 2011-07-04 NOTE — Assessment & Plan Note (Signed)
Continues to be quite bothersome for the patient.  Not worse and no red flags signs.  Plan: As never has been to PT [facility] in the hopes that some back strength exercises may reduce his pain medication needs. Will follow.

## 2011-07-10 ENCOUNTER — Encounter: Payer: Self-pay | Admitting: Internal Medicine

## 2011-07-10 ENCOUNTER — Ambulatory Visit (INDEPENDENT_AMBULATORY_CARE_PROVIDER_SITE_OTHER): Payer: Medicaid Other | Admitting: Surgery

## 2011-07-10 ENCOUNTER — Encounter (INDEPENDENT_AMBULATORY_CARE_PROVIDER_SITE_OTHER): Payer: Self-pay | Admitting: Surgery

## 2011-07-10 ENCOUNTER — Encounter (INDEPENDENT_AMBULATORY_CARE_PROVIDER_SITE_OTHER): Payer: Self-pay | Admitting: General Surgery

## 2011-07-10 VITALS — Temp 98.5°F

## 2011-07-10 DIAGNOSIS — C73 Malignant neoplasm of thyroid gland: Secondary | ICD-10-CM | POA: Insufficient documentation

## 2011-07-10 NOTE — Patient Instructions (Signed)
Apply cocoa butter cream to incision as instructed.

## 2011-07-10 NOTE — Progress Notes (Signed)
HISTORY: Pt returns having thyroid lobectomy for Hurthle cell tumor.  Pathology shows minimally invasive tumor with negative margins.   PERTINENT REVIEW OF SYSTEMS: No pain, no drainage.    EXAM: Normal voice.  Wound healing nicely.  Minimal soft tissue swelling.  No seroma.   IMPRESSION: Minimally invasive Hurthle cell carcinoma, status post left thyroid lobectomy   PLAN: Patient and I discussed pathology results.  I gave him a copy of the final report.  I feel he may require completion thyroidectomy, but he is not interested in further surgery at this time.  I will check a TSH level today.  He is not currently on thyroid hormone.  I am going to ask him to see Dr. Romero Belling at [facility] for another opinion regarding the minimally invasive Hurthle cell carcinoma.  Patient agrees to see Dr. Everardo All and discuss the potential need for further surgery and radioactive iodine treatment.  I will see him back in 6 weeks for wound check.

## 2011-07-11 LAB — TSH: TSH: 3.111 u[IU]/mL (ref 0.350–4.500)

## 2011-07-12 ENCOUNTER — Telehealth: Payer: Self-pay | Admitting: *Deleted

## 2011-07-12 ENCOUNTER — Telehealth: Payer: Self-pay | Admitting: Internal Medicine

## 2011-07-12 ENCOUNTER — Telehealth (INDEPENDENT_AMBULATORY_CARE_PROVIDER_SITE_OTHER): Payer: Self-pay

## 2011-07-12 NOTE — Telephone Encounter (Signed)
Walk In Pt Form" Pt Dropped off forms for completion from [employer]" sent to [facility] S 07/12/11/km

## 2011-07-12 NOTE — Telephone Encounter (Signed)
Patient informed to stop thyroid medicine and copy of results sent to Dr. Everardo All.

## 2011-07-12 NOTE — Telephone Encounter (Signed)
Received paperwork from [employer] social worker for [employer], they are providing tele-monitoring, and needed form completed, I have completed form section for CHF and htn, [name] is aware and will pick up form this afternoon

## 2011-07-13 NOTE — Progress Notes (Signed)
Pt is aware.  

## 2011-07-18 ENCOUNTER — Ambulatory Visit: Payer: Medicaid Other | Attending: Family Medicine | Admitting: Physical Therapy

## 2011-07-18 DIAGNOSIS — M545 Low back pain, unspecified: Secondary | ICD-10-CM | POA: Insufficient documentation

## 2011-07-18 DIAGNOSIS — IMO0001 Reserved for inherently not codable concepts without codable children: Secondary | ICD-10-CM | POA: Insufficient documentation

## 2011-07-18 DIAGNOSIS — R293 Abnormal posture: Secondary | ICD-10-CM | POA: Insufficient documentation

## 2011-07-27 ENCOUNTER — Ambulatory Visit: Payer: Medicaid Other | Admitting: Endocrinology

## 2011-07-27 DIAGNOSIS — Z0289 Encounter for other administrative examinations: Secondary | ICD-10-CM

## 2011-08-01 ENCOUNTER — Ambulatory Visit: Payer: Medicaid Other | Attending: Family Medicine | Admitting: Physical Therapy

## 2011-08-01 ENCOUNTER — Other Ambulatory Visit: Payer: Self-pay | Admitting: Cardiovascular Disease

## 2011-08-01 DIAGNOSIS — R293 Abnormal posture: Secondary | ICD-10-CM | POA: Insufficient documentation

## 2011-08-01 DIAGNOSIS — M545 Low back pain, unspecified: Secondary | ICD-10-CM | POA: Insufficient documentation

## 2011-08-01 DIAGNOSIS — IMO0001 Reserved for inherently not codable concepts without codable children: Secondary | ICD-10-CM | POA: Insufficient documentation

## 2011-08-08 ENCOUNTER — Ambulatory Visit: Payer: Medicaid Other | Admitting: Physical Therapy

## 2011-08-09 ENCOUNTER — Ambulatory Visit: Payer: Medicaid Other | Admitting: Internal Medicine

## 2011-08-10 ENCOUNTER — Ambulatory Visit (HOSPITAL_COMMUNITY)
Admission: RE | Admit: 2011-08-10 | Discharge: 2011-08-10 | Disposition: A | Discharge status: Home / Self Care | Payer: Medicaid Other | Source: Ambulatory Visit | Attending: Internal Medicine | Admitting: Internal Medicine

## 2011-08-10 DIAGNOSIS — I5022 Chronic systolic (congestive) heart failure: Secondary | ICD-10-CM | POA: Insufficient documentation

## 2011-08-10 DIAGNOSIS — E782 Mixed hyperlipidemia: Secondary | ICD-10-CM | POA: Insufficient documentation

## 2011-08-10 DIAGNOSIS — E785 Hyperlipidemia, unspecified: Secondary | ICD-10-CM

## 2011-08-10 DIAGNOSIS — I25810 Atherosclerosis of coronary artery bypass graft(s) without angina pectoris: Secondary | ICD-10-CM

## 2011-08-10 DIAGNOSIS — I10 Essential (primary) hypertension: Secondary | ICD-10-CM

## 2011-08-10 NOTE — Progress Notes (Signed)
History of Present Illness: Primary Cardiologist:  Dr. Arvilla Meres  Dalessandro is a 50 y.o. male with history of severe HTN, coronary artery disease status post previous myocardial infarction and bypass surgery in 2006 with DES to native PDA in 2011.  He also has a history congestive heart failure secondary to ischemic cardiomyopathy with EF in 30-35% range.  He is s/p single chamber ICD.  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 He underwent emergent repair of proximal aorta and reimplantation of his CABG grafts.  Carotids u/s in 2011 0-39% bilaterally with highly vascular lesion in left thyroid lobe.  Biopsy showed Hurthle cell lesion.  Saw [name] who is planning sub-total thyroidectomy on 6/26.   He was admitted 4/23-4/24 with chest pain.  Myocardial infarction was ruled out.  CP was felt to be musculoskeletal.  Lipitor was added back to his medical regimen last visit. He was having increased CP almost every day. Responds to NTG.  Underwent Nuclear Stress Test that appeared unchanged from prior study in 2010.    He returns for routine follow up today.  His chest pain has returned to baseline.  He has interment episodes but they are fleeting and unlike the increased CP he was evaluated for last visit.  Main complaint at this time is difficulty sleeping throughout the evening.  He has started working at home but does not feel that this is the complete issue.  He has also had problems with dizziness with standing.  He cut back on his hydralazine from 75 mg to 50 mg over the last week and this has seemed to improve his symptoms.  Typical SBP ~130 on new regimen but was running ~115 prior to change in medication.  He denies orthopnea, PND or edema.  No weight gain.  No syncope.  He has not taken any prn lasix.     Past Medical History  Diagnosis Date  . CAD (coronary artery disease)     a. s/p CABG 2006;  b. DES to PDA 2011 (cath: Dx not  seen, dRCA/PDA tx with DES; S-PDA occluded (culprit), S-Dx occluded, S-RI and OM ok, L-LAD ok  . HTN (hypertension)     severe  . Chronic systolic heart failure     a. echo 8/11: EF 35%, mod LVH, Grade 1 diast dysfxn, mild AI, mild MR, inf and post HK  . 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 CRI (chronic renal insufficiency)   . Thyroid cancer     Hertle Cell  . HLD (hyperlipidemia)   . Anemia   . Gout   . AICD (automatic cardioverter/defibrillator) present   . Carotid stenosis     dopplers 2011: 0-39% bilat.    Current Outpatient Prescriptions  Medication Sig Dispense Refill  . amLODipine (NORVASC) 10 MG tablet Take 10 mg by mouth daily.        [name] amoxicillin (AMOXIL) 500 MG capsule Take 4 caps 1 hour before dental appointment.      [name] aspirin 325 MG tablet Take 325 mg by mouth daily. Patient splits tablet in fourths       . atorvastatin (LIPITOR) 40 MG tablet Take 40 mg by mouth every other day.       . B Complex-C (B-COMPLEX WITH VITAMIN C) tablet Take 1 tablet by mouth daily.        . carvedilol (COREG) 25 MG tablet  Take 1 tablet (25 mg total) by mouth 2 (two) times daily.  60 tablet  11  . cetirizine (ZYRTEC) 10 MG tablet Take 10 mg by mouth as needed.       . digoxin (LANOXIN) 0.25 MG tablet Take 250 mcg by mouth daily.       . furosemide (LASIX) 80 MG tablet Take 1 tablet (80 mg total) by mouth as needed.  30 tablet  11  . hydrALAZINE (APRESOLINE) 25 MG tablet Take 50 mg by mouth 3 (three) times daily.       [name] HYDROcodone-acetaminophen (NORCO) 10-325 MG per tablet Take 1 tablet by mouth every 6 (six) hours as needed. May take every 4 to 6 hours as needed for severe pain       . isosorbide mononitrate (IMDUR) 30 MG 24 hr tablet Take 1 tablet (30 mg total) by mouth daily.  30 tablet  6  . lisinopril (PRINIVIL,ZESTRIL) 20 MG tablet Take 1 tablet (20 mg total) by mouth daily.  30 tablet  6  . nitroGLYCERIN (NITROSTAT) 0.4 MG SL tablet Place 1 tablet (0.4  mg total) under the tongue every 5 (five) minutes as needed.  25 tablet  6  . PLAVIX 75 MG tablet TAKE ONE TABLET BY MOUTH EVERY DAY WITH FOOD  30 each  6  . potassium chloride SA (K-DUR,KLOR-CON) 20 MEQ tablet Take 20 mEq by mouth daily. 1 tab when pt takes lasix       . vardenafil (LEVITRA) 20 MG tablet Take 20 mg by mouth daily as needed.          Allergies  Allergen Reactions  . Lipitor (Atorvastatin Calcium) Anaphylaxis  . Iohexol      Desc: PT HAS ANAPHYLAXIS WITH CONTRAST MEDIA!, Onset Date: 16109604   Code: SOB, Desc: ok w/ 13hr prep//a.c., Onset Date: 54098119   . Latex     REACTION: Reaction not known  . Sulfonamide Derivatives   . Zocor (Simvastatin)     Vital Signs: BP 131/73  Pulse 62  Wt 193 lb (87.544 kg)  PHYSICAL EXAM: Well nourished, well developed, in no acute distress HEENT: normal Neck: no JVD Cardiac:  normal S1, S2; RRR; +s4 2/6 SEM RSB Lungs:  clear to auscultation bilaterally, no wheezing, rhonchi or rales Abd: soft, nontender, no hepatomegaly Ext: no edema Skin: warm and dry Neuro:  CNs 2-12 intact, no focal abnormalities noted   ASSESSMENT AND PLAN:

## 2011-08-10 NOTE — Assessment & Plan Note (Addendum)
Goal LDL < 70. Restarted lipitor in June 2012.  Will check a fasting lipid panel next week.

## 2011-08-10 NOTE — Patient Instructions (Signed)
Start Melatonin 5 mg at bedtime can take 2 tabs if needed  Your physician recommends that you return for a FASTING lipid, liver and bmet on Tue 8/22 at 8:30  Your physician recommends that you schedule a follow-up appointment in: 4 months.

## 2011-08-10 NOTE — Assessment & Plan Note (Addendum)
NYHA II. Volume status at baseline and doing a very good job of monitoring daily weights and fluid intake.  Will continue lasix prn.  Continue to monitor daily weights.  Will check BMET next week to follow renal function. Medication titration limited by symptomatic hypotension.   Patient seen and examined with Ulyess Blossom PA-C. We discussed all aspects of the encounter. I agree with the assessment and plan as stated above.

## 2011-08-10 NOTE — Assessment & Plan Note (Addendum)
Given previous dissection stressed need to keep SBP <140 (lower, if possible).  With decrease in hydralazine it appears to be stable around SBP 130, with dizziness resolved will continue current dose of 50 mg.  He has been instructed to continue daily BP checks and to call if they increase.

## 2011-08-10 NOTE — Assessment & Plan Note (Addendum)
Stable.  Myoview unchanged from prior study and chest pain resolved.  Will continue current medical regimen without further ischemic evaluation. Given renal insuffiency and solitary kidney only cath if clearly ischemic.

## 2011-08-15 ENCOUNTER — Other Ambulatory Visit: Payer: Medicaid Other | Admitting: *Deleted

## 2011-08-15 ENCOUNTER — Ambulatory Visit: Payer: Medicaid Other | Admitting: Physical Therapy

## 2011-08-16 ENCOUNTER — Other Ambulatory Visit: Payer: Self-pay | Admitting: Cardiothoracic Surgery

## 2011-08-16 DIAGNOSIS — I7101 Dissection of thoracic aorta: Secondary | ICD-10-CM

## 2011-09-04 IMAGING — CR DG CHEST 1V PORT
1 series · 1 of 1 positions shown · non-contrast
Comparison: Portable chest x-ray of 11/23/2010

CLINICAL DATA: Acute chest pain today, cough, shortness of breath

PORTABLE CHEST - 1 VIEW

[view not recorded]
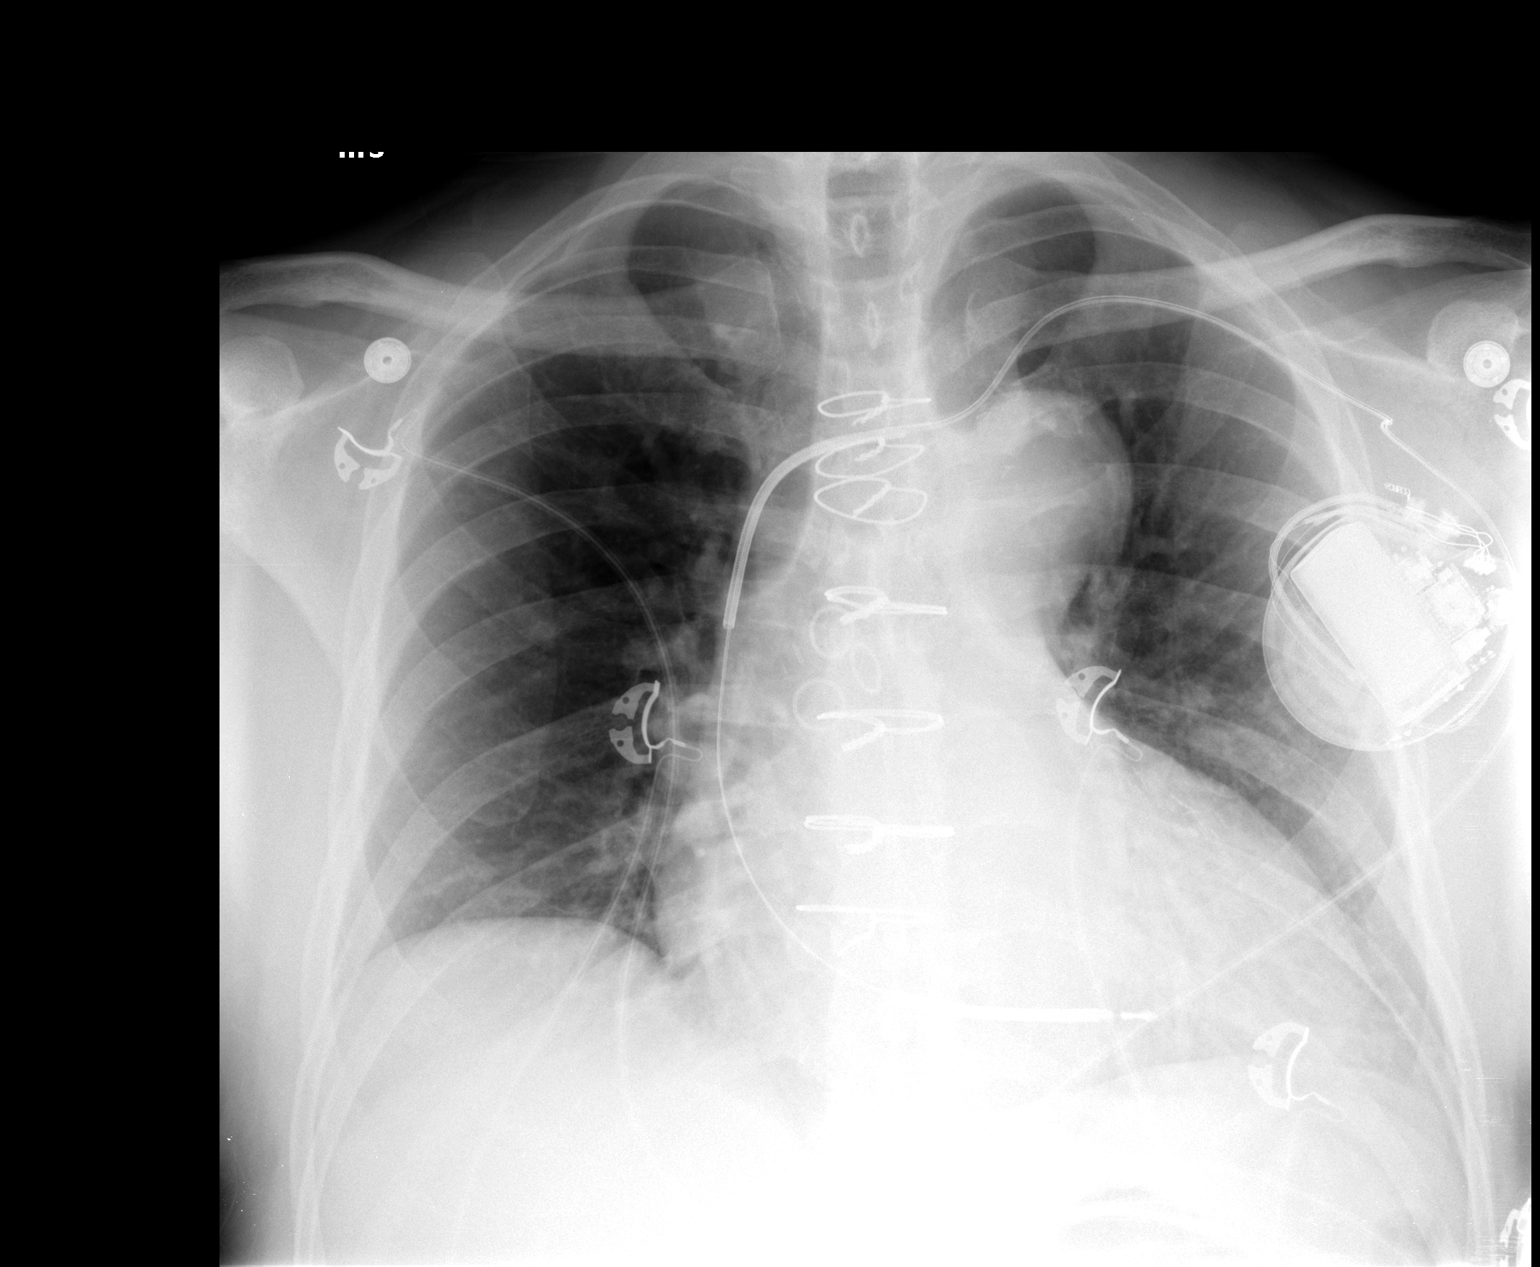

[1 of 1 positions shown; findings below may reference images not displayed]

FINDINGS: No active infiltrate or effusion is seen.  Cardiomegaly
is stable.  A permanent pacemaker with AICD lead remains.
IMPRESSION: Stable cardiomegaly with pacer.  No active lung disease.

## 2011-09-07 ENCOUNTER — Telehealth: Payer: Self-pay | Admitting: Internal Medicine

## 2011-09-07 NOTE — Telephone Encounter (Signed)
Patient calling no information given. Need to discuss situation that happen out of town at his [place].

## 2011-09-07 NOTE — Telephone Encounter (Signed)
Spoke w/pt he states he has an important call on other line but really needs appt with Dr Gala Romney b/c he has not been feeling well, will call him tomorrow with appt

## 2011-09-12 NOTE — Telephone Encounter (Signed)
Pt states he is feeling some better but would really like to see Dr Gala Romney to discuss with him and have a"check-up to make sure everything is ok" he would not give details, appt scheduled for 9/20 at 1:30

## 2011-09-13 ENCOUNTER — Ambulatory Visit
Admission: RE | Admit: 2011-09-13 | Discharge: 2011-09-13 | Disposition: A | Discharge status: Home / Self Care | Payer: Medicaid Other | Source: Ambulatory Visit | Attending: Cardiothoracic Surgery | Admitting: Cardiothoracic Surgery

## 2011-09-13 ENCOUNTER — Encounter (HOSPITAL_COMMUNITY): Payer: Self-pay

## 2011-09-13 ENCOUNTER — Ambulatory Visit: Payer: Medicaid Other | Admitting: Family Medicine

## 2011-09-13 ENCOUNTER — Ambulatory Visit (HOSPITAL_COMMUNITY)
Admission: RE | Admit: 2011-09-13 | Discharge: 2011-09-13 | Disposition: A | Discharge status: Home / Self Care | Payer: Medicaid Other | Source: Ambulatory Visit | Attending: Internal Medicine | Admitting: Internal Medicine

## 2011-09-13 ENCOUNTER — Ambulatory Visit: Payer: Medicaid Other | Admitting: Cardiothoracic Surgery

## 2011-09-13 DIAGNOSIS — I10 Essential (primary) hypertension: Secondary | ICD-10-CM | POA: Insufficient documentation

## 2011-09-13 DIAGNOSIS — I5022 Chronic systolic (congestive) heart failure: Secondary | ICD-10-CM | POA: Insufficient documentation

## 2011-09-13 DIAGNOSIS — I2510 Atherosclerotic heart disease of native coronary artery without angina pectoris: Secondary | ICD-10-CM | POA: Insufficient documentation

## 2011-09-13 DIAGNOSIS — Z95810 Presence of automatic (implantable) cardiac defibrillator: Secondary | ICD-10-CM | POA: Insufficient documentation

## 2011-09-13 DIAGNOSIS — I7101 Dissection of thoracic aorta: Secondary | ICD-10-CM

## 2011-09-13 DIAGNOSIS — E785 Hyperlipidemia, unspecified: Secondary | ICD-10-CM

## 2011-09-13 DIAGNOSIS — I25810 Atherosclerosis of coronary artery bypass graft(s) without angina pectoris: Secondary | ICD-10-CM

## 2011-09-13 LAB — LIPID PANEL
HDL: 46 mg/dL (ref >39)
LDL Cholesterol: 128 mg/dL — ABNORMAL HIGH (ref 0–99)
Triglycerides: 171 mg/dL — ABNORMAL HIGH (ref <150)
VLDL: 34 mg/dL (ref 0–40)

## 2011-09-13 LAB — COMPREHENSIVE METABOLIC PANEL
ALT: 14 U/L (ref 0–53)
AST: 15 U/L (ref 0–37)
Albumin: 4.4 g/dL (ref 3.5–5.2)
Chloride: 103 mEq/L (ref 96–112)
Creatinine, Ser: 1.44 mg/dL — ABNORMAL HIGH (ref 0.50–1.35)
Sodium: 140 mEq/L (ref 135–145)
Total Bilirubin: 0.5 mg/dL (ref 0.3–1.2)

## 2011-09-13 NOTE — Assessment & Plan Note (Signed)
Blood pressure well controlled. Continue current regimen.  

## 2011-09-13 NOTE — Progress Notes (Signed)
History of Present Illness: Primary Cardiologist:  Dr. Arvilla Meres  Chad [name] is a 50 y.o. male with history of severe HTN, coronary artery disease status post previous myocardial infarction and bypass surgery in 2006 with DES to native PDA in 2011.  He also has a history congestive heart failure secondary to ischemic cardiomyopathy with EF in 30-35% range.  He is s/p single chamber ICD.  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 He underwent emergent repair of proximal aorta and reimplantation of his CABG grafts.  Carotids u/s in 2011 0-39% bilaterally with highly vascular lesion in left thyroid lobe.  Biopsy showed Hurthle cell lesion.  Saw [name] who is planning sub-total thyroidectomy on 06/20/11.   Cath 07/2010: 1. Severe three-vessel coronary artery disease with 3/5 patent grafts.   2. Subacute occlusion of the vein graft to the posterior descending       artery, which is likely the culprit vessel.   3. Successful percutaneous transluminal coronary angioplasty with       placement of a drug-eluting stent in the distal native right       coronary artery.      He was admitted 4/23-4/24/12 with chest pain.  Myocardial infarction was ruled out.  CP was felt to be musculoskeletal.   He has been intolerant to Zocor and Lipitor in the past.  He returns for follow up.   Sad because his mother died at home - likely due to SCD. Autopsy revealed severe coronary disease.  Says he gets CP almost every day. Last 5-10 mins. Comes on at any time. Feels that is worse since his mom died. Responds to NTG. Taking 1/2 of atorvastatin about 2-3x/week. If he takes more gets myalgias. Taking BP several times/day typically 120-103/70-80. He denies orthopnea, PND or edema.  No weight gain.  No syncope or recent near syncope.    Past Medical History  Diagnosis Date  . CAD (coronary artery disease)     a. s/p CABG 2006;  b. DES to PDA 2011 (cath: Dx  not seen, dRCA/PDA tx with DES; S-PDA occluded (culprit), S-Dx occluded, S-RI and OM ok, L-LAD ok  . HTN (hypertension)     severe  . Chronic systolic heart failure     a. echo 8/11: EF 35%, mod LVH, Grade 1 diast dysfxn, mild AI, mild MR, inf and post HK  . 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 CRI (chronic renal insufficiency)   . Thyroid cancer     Hertle Cell  . HLD (hyperlipidemia)   . Anemia   . Gout   . AICD (automatic cardioverter/defibrillator) present   . Carotid stenosis     dopplers 2011: 0-39% bilat.    Current Outpatient Prescriptions  Medication Sig Dispense Refill  . amLODipine (NORVASC) 10 MG tablet Take 10 mg by mouth daily.        [name] amoxicillin (AMOXIL) 500 MG capsule Take 4 caps 1 hour before dental appointment.      [name] aspirin 325 MG tablet Take 325 mg by mouth daily. Patient splits tablet in fourths       . atorvastatin (LIPITOR) 40 MG tablet Take 40 mg by mouth every other day.       . B Complex-C (B-COMPLEX WITH VITAMIN C) tablet Take 1 tablet by mouth daily.        . carvedilol (COREG) 25 MG tablet  Take 1 tablet (25 mg total) by mouth 2 (two) times daily.  60 tablet  11  . digoxin (LANOXIN) 0.25 MG tablet Take 250 mcg by mouth daily.       . furosemide (LASIX) 80 MG tablet Take 1 tablet (80 mg total) by mouth as needed.  30 tablet  11  . hydrALAZINE (APRESOLINE) 25 MG tablet Take 75 mg by mouth 3 (three) times daily.       [name] HYDROcodone-acetaminophen (NORCO) 10-325 MG per tablet Take 1 tablet by mouth every 6 (six) hours as needed. May take every 4 to 6 hours as needed for severe pain       . isosorbide mononitrate (IMDUR) 30 MG 24 hr tablet Take 1 tablet (30 mg total) by mouth daily.  30 tablet  6  . lisinopril (PRINIVIL,ZESTRIL) 20 MG tablet Take 1 tablet (20 mg total) by mouth daily.  30 tablet  6  . nitroGLYCERIN (NITROSTAT) 0.4 MG SL tablet Place 1 tablet (0.4 mg total) under the tongue every 5 (five) minutes as needed.  25  tablet  6  . PLAVIX 75 MG tablet TAKE ONE TABLET BY MOUTH EVERY DAY WITH FOOD  30 each  6  . potassium chloride SA (K-DUR,KLOR-CON) 20 MEQ tablet Take 20 mEq by mouth daily. 1 tab when pt takes lasix       . vardenafil (LEVITRA) 20 MG tablet Take 20 mg by mouth daily as needed.          Allergies  Allergen Reactions  . Lipitor (Atorvastatin Calcium) Anaphylaxis  . Iohexol      Desc: PT HAS ANAPHYLAXIS WITH CONTRAST MEDIA!, Onset Date: [id number]   Code: SOB, Desc: ok w/ 13hr prep//a.c., Onset Date: [id number]   . Latex     REACTION: Reaction not known  . Sulfonamide Derivatives   . Zocor (Simvastatin)     Vital Signs: BP 141/93  Pulse 78  Wt 187 lb 8 oz (85.049 kg)  PHYSICAL EXAM: Well nourished, well developed, in no acute distress HEENT: normal Neck: no JVD Cardiac:  normal S1, S2; RRR; +s4 2/6 SEM RSB Lungs:  clear to auscultation bilaterally, no wheezing, rhonchi or rales Abd: soft, nontender, no hepatomegaly Ext: no edema Skin: warm and dry Neuro:  CNs 2-12 intact, no focal abnormalities noted    ASSESSMENT AND PLAN:

## 2011-09-13 NOTE — Assessment & Plan Note (Signed)
Continues with daily CP; perhaps slightly worse. Given Ao dissection and CRI with solitary kidney have a high threshold to cath. Will start with ETT/Myoview.

## 2011-09-13 NOTE — Patient Instructions (Signed)
Labs today  Your physician has requested that you have en exercise stress myoview. For further information please visit [email or web address]. Please follow instruction sheet, as given.  Your physician recommends that you schedule a follow-up appointment in: 2 months  

## 2011-09-13 NOTE — Assessment & Plan Note (Signed)
Stressed need to take statin on a daily basis. Goal LDL < 70. Recheck lipids today.

## 2011-09-14 ENCOUNTER — Ambulatory Visit (INDEPENDENT_AMBULATORY_CARE_PROVIDER_SITE_OTHER): Payer: Medicaid Other | Admitting: Family Medicine

## 2011-09-14 ENCOUNTER — Ambulatory Visit (HOSPITAL_COMMUNITY): Payer: Medicaid Other

## 2011-09-14 ENCOUNTER — Encounter: Payer: Self-pay | Admitting: Family Medicine

## 2011-09-14 VITALS — BP 110/68 | HR 71 | Wt 186.9 lb

## 2011-09-14 DIAGNOSIS — G4700 Insomnia, unspecified: Secondary | ICD-10-CM

## 2011-09-14 DIAGNOSIS — M5430 Sciatica, unspecified side: Secondary | ICD-10-CM

## 2011-09-14 DIAGNOSIS — Z23 Encounter for immunization: Secondary | ICD-10-CM

## 2011-09-14 MED ORDER — TRAZODONE HCL 50 MG PO TABS: 50.0000 mg | ORAL_TABLET | Freq: Every evening | ORAL | Status: AC | PRN

## 2011-09-14 NOTE — Patient Instructions (Signed)
Thank you for coming in today. Take trazodone as needed for sleep. Please review the sleep hygiene handout and to try to make some of the changes suggested. We will call you with your information for pain clinic. I'll call you if your thyroid level is normal. Come and see me in one to two months.

## 2011-09-14 NOTE — Assessment & Plan Note (Signed)
Likely multifactorial. Will check TSH and work on sleep hygiene. Handout is given. Additionally we'll start trazodone 2 for use as needed. I will follow this issue up at the next visit in one to 2 months.

## 2011-09-14 NOTE — Progress Notes (Signed)
[name] presents to clinic today to discuss his back pain and insomnia.  1) back pain. Chronic in nature taking Norco 10-325 mg prescribed by Dr. Dutch Quint neurosurgery. [name] wishes to transfer his care to a pain clinic as the physician there will be more available for appointments. He otherwise has no changes to his pain control.  2) insomnia: Present for years. He notes that he can fall asleep but wakes up an hour later and have trouble falling back asleep. Typically he goes to bed at 8-10 PM actually falls asleep for the right night at 3:30 a.m. and wakes up at 6:30 AM. He feels very fatigued during the day and often takes naps. He says that he cannot fall back asleep because he feels like his brain doesn't shut down and is running through his head. He's never been diagnosed with depression or bipolar. He doesn't take any caffeine or alcohol. When discussing sleep hygiene he does note that he will work in bed and watch TV before he goes to sleep. PHQ9 was 8  PMH reviewed.  ROS as above otherwise neg Medications reviewed.  Exam:  BP 110/68  Pulse 71  Wt 186 lb 14.4 oz (84.777 kg) Gen: Well NAD HEENT: EOMI,  MMM Lungs: CTABL Nl WOB Heart: RRR no MRG Abd: NABS, NT, ND Exts: Non edematous BL  LE, warm and well perfused.

## 2011-09-14 NOTE — Assessment & Plan Note (Signed)
Chronic back pain. I agree with patient that referral to pain management may be in his best interest. Referral placed.

## 2011-09-15 ENCOUNTER — Encounter: Payer: Self-pay | Admitting: Cardiothoracic Surgery

## 2011-09-15 LAB — COMPREHENSIVE METABOLIC PANEL
ALT: 15
Albumin: 3.8
Alkaline Phosphatase: 61
Chloride: 106
Glucose, Bld: 108 — ABNORMAL HIGH
Potassium: 3.9
Sodium: 142
Total Protein: 6.4

## 2011-09-15 LAB — POCT I-STAT CREATININE
Creatinine, Ser: 1.5
Operator id: 161631

## 2011-09-15 LAB — CBC
HCT: 41.2
HCT: 42.4
Hemoglobin: 13.9
MCHC: 33.2
MCHC: 33.4
MCHC: 33.5
MCHC: 33.6
MCV: 81.7
MCV: 82
Platelets: 141 — ABNORMAL LOW
Platelets: 150
RBC: 5.03
RBC: 5.45
RDW: 14.2
RDW: 14.3
WBC: 3.9 — ABNORMAL LOW

## 2011-09-15 LAB — TROPONIN I: Troponin I: 0.01

## 2011-09-15 LAB — I-STAT 8, (EC8 V) (CONVERTED LAB)
BUN: 18
Chloride: 105
Glucose, Bld: 97
Potassium: 4.3
pCO2, Ven: 53.3 — ABNORMAL HIGH
pH, Ven: 7.348 — ABNORMAL HIGH

## 2011-09-15 LAB — DIFFERENTIAL
Basophils Relative: 0
Eosinophils Absolute: 0.2
Lymphocytes Relative: 41
Monocytes Absolute: 0.4
Neutrophils Relative %: 46

## 2011-09-15 LAB — TSH: TSH: 1.428

## 2011-09-15 LAB — LIPID PANEL
HDL: 36 — ABNORMAL LOW
Triglycerides: 180 — ABNORMAL HIGH
VLDL: 36

## 2011-09-15 LAB — DIGOXIN LEVEL: Digoxin Level: 1

## 2011-09-15 LAB — RAPID URINE DRUG SCREEN, HOSP PERFORMED
Amphetamines: NOT DETECTED
Barbiturates: NOT DETECTED

## 2011-09-15 LAB — CK TOTAL AND CKMB (NOT AT ARMC)
CK, MB: 0.9
CK, MB: 1.1
Relative Index: 0.5
Total CK: 171

## 2011-09-15 LAB — POCT CARDIAC MARKERS
CKMB, poc: <1 — ABNORMAL LOW
Myoglobin, poc: 53.3
Operator id: 161631

## 2011-09-15 LAB — PROTIME-INR: INR: 1

## 2011-09-15 LAB — B-NATRIURETIC PEPTIDE (CONVERTED LAB): Pro B Natriuretic peptide (BNP): 126 — ABNORMAL HIGH

## 2011-09-15 LAB — HEPARIN LEVEL (UNFRACTIONATED): Heparin Unfractionated: 0.8 — ABNORMAL HIGH

## 2011-09-15 LAB — APTT: aPTT: 139 — ABNORMAL HIGH

## 2011-09-18 ENCOUNTER — Telehealth: Payer: Self-pay | Admitting: *Deleted

## 2011-09-18 NOTE — Telephone Encounter (Signed)
Pt needs to go to Dr.Pool and sign a ROI and have records [facility] to [facility] @ [phone]. Pt agreed and will do that. Lorenda Hatchet, Breasia Karges P.s. Informed the pain ctr of above (by fax)

## 2011-09-19 ENCOUNTER — Ambulatory Visit (HOSPITAL_COMMUNITY): Payer: Medicaid Other | Attending: Internal Medicine | Admitting: Radiology

## 2011-09-19 VITALS — Ht 70.0 in | Wt 189.0 lb

## 2011-09-19 DIAGNOSIS — I4949 Other premature depolarization: Secondary | ICD-10-CM

## 2011-09-19 DIAGNOSIS — R0789 Other chest pain: Secondary | ICD-10-CM

## 2011-09-19 DIAGNOSIS — I447 Left bundle-branch block, unspecified: Secondary | ICD-10-CM

## 2011-09-19 DIAGNOSIS — I25810 Atherosclerosis of coronary artery bypass graft(s) without angina pectoris: Secondary | ICD-10-CM | POA: Insufficient documentation

## 2011-09-19 MED ORDER — TECHNETIUM TC 99M TETROFOSMIN IV KIT
33.0000 | PACK | Freq: Once | INTRAVENOUS | Status: AC | PRN
  Administered 2011-09-19: 33 via INTRAVENOUS

## 2011-09-19 MED ORDER — TECHNETIUM TC 99M TETROFOSMIN IV KIT
11.0000 | PACK | Freq: Once | INTRAVENOUS | Status: AC | PRN
  Administered 2011-09-19: 11 via INTRAVENOUS

## 2011-09-19 NOTE — Progress Notes (Signed)
[facility] SITE 3 NUCLEAR MED [address] [place] [address] [phone]  Cardiology Nuclear Med Chad [name] is a 50 y.o. male [id number] [birth year]   Nuclear Med Background Indication for Stress Test:  Evaluation for Ischemia, Graft/Stent Patency  History:  Asthma and '06 MI>CABG/AICD; '10 Aortic Dissection; '11 Echo:EF=35%; '11 MI>Stents-PDA/RCA; 6/12 ONG:[id number] scar, ? Mid anterior ischemia, EF=35% Cardiac Risk Factors: Carotid Disease:0-39%, Hypertension:severe, LBBB and Lipids  Symptoms:  Chest Pain/Pressure.  (last episode of chest discomfort was yesterday), Diaphoresis, Dizziness, DOE/SOB, Fatigue, Palpitations   Nuclear Pre-Procedure Caffeine/Decaff Intake:  None NPO After: 9:00pm   Lungs:  Clear.  O2 SAT 99% on RA. IV 0.9% NS with Angio Cath:  20g  IV Site: R Hand  IV Started by:  Cathlyn Parsons, RN  Chest Size (in):  40 Cup Size: n/a  Height: 5\' 10"  (1.778 m)  Weight:  189 lb (85.73 kg)  BMI:  Body mass index is 27.12 kg/(m^2). Tech Comments:  Coreg held x 17 hours    Nuclear Med Study 1 or 2 day study: 1 day  Stress Test Type:  Stress  Reading MD: Marca Ancona, MD  Order Authorizing Provider:  Arvilla Meres, MD  Resting Radionuclide: Technetium 57m Tetrofosmin  Resting Radionuclide Dose: 11.0 mCi   Stress Radionuclide:  Technetium 15m Tetrofosmin  Stress Radionuclide Dose: 33.0 mCi           Stress Protocol Rest HR: 74 Stress HR: 153  Rest BP: 113/81 Stress BP: 192/85  Exercise Time (min): 8:46 METS: 10.1   Predicted Max HR: 172 bpm % Max HR: 88.95 bpm Rate Pressure Product: 24401   Dose of Adenosine (mg):  n/a Dose of Lexiscan: n/a mg  Dose of Atropine (mg): n/a Dose of Dobutamine: n/a mcg/kg/min (at max HR)  Stress Test Technologist: Smiley Houseman, CMA-N  Nuclear Technologist:  Domenic Polite, CNMT     Rest Procedure:  Myocardial perfusion imaging was performed at rest 45 minutes following the intravenous  administration of Technetium 26m Tetrofosmin.  Rest ECG: ILBBB with nonspecific T-wave changes and rare PVC.  Stress Procedure:  The patient's defibrillator was turned off prior to exercising per Dr. Gala Romney.  The patient exercised for 8:46 on the treadmill utilizing the Bruce protocol.  The patient stopped due to chest discomfort, 10/10.  There were no diagnostic ST-T wave changes,  but there ST changes in V4 at peak and immediately post exercise.   There were frequent PVC's with frequent couplets. Technetium 23m Tetrofosmin was injected at peak exercise and myocardial perfusion imaging was performed after a brief delay.  Patient's defibrillator was turned back on immediately post exercise.  Stress ECG: Equivocal ECG changes.   QPS Raw Data Images:  Normal; no motion artifact; normal heart/lung ratio. Stress Images:  Moderate inferior perfusion defect.  Rest Images:  Moderate inferior perfusion defect.  Subtraction (SDS):  Moderate-sized, fixed inferior perfusion defect.  Transient Ischemic Dilatation (Normal <1.22):  1.12 Lung/Heart Ratio (Normal <0.45):  0.34  Quantitative Gated Spect Images QGS EDV:  223 ml QGS ESV:  153 ml QGS cine images:  Septal hypokinesis.  Inferior hypokinesis.  QGS EF: 31%  Impression Exercise Capacity:  Good exercise capacity. BP Response:  Hypertensive blood pressure response. Clinical Symptoms:  Chest pain ECG Impression:  Baseline T wave inversions inferiorly and anterolaterally.  With exertion, there was 1 mm horizontal ST depression in V4.  Other leads had nonspecific changes.  This is an equivocal ECG response.  Comparison  with Prior Nuclear Study: No significant change from previous study  Overall Impression:  Moderate-sized fixed inferior perfusion defect consistent with prior MI.  EF 31% with septal hypokinesis. There was also inferior hypokinesis.  Similar to prior study.   [name] [employer]

## 2011-09-19 NOTE — Progress Notes (Deleted)
OUTPATIENT IMAGING DISCHARGE INSTRUCTIONS   You can leave immediately.   Your activity will not be restricted.  You may drive if you wish, resume your normal diet, exercise and take all prescribed medications.  As a reminder, it is very important that you carry medication information at all times in the event of emergency situations.   The Nuclear / Echo study will be reviewed a physician who will send a report to your doctor.   Your doctor will contact you about the results of your procedure. 

## 2011-09-20 ENCOUNTER — Ambulatory Visit: Payer: Medicaid Other | Admitting: Cardiothoracic Surgery

## 2011-09-21 LAB — POCT I-STAT, CHEM 8
Chloride: 106
Glucose, Bld: 108 — ABNORMAL HIGH
HCT: 47
Potassium: 3.9

## 2011-09-21 LAB — CBC
MCV: 83
RBC: 5.41
WBC: 3.6 — ABNORMAL LOW

## 2011-09-21 LAB — PROTIME-INR: INR: 0.9

## 2011-09-21 LAB — HEPARIN LEVEL (UNFRACTIONATED): Heparin Unfractionated: 0.69

## 2011-09-21 LAB — CARDIAC PANEL(CRET KIN+CKTOT+MB+TROPI)
CK, MB: 1
Total CK: 184

## 2011-09-21 LAB — COMPREHENSIVE METABOLIC PANEL
Albumin: 4.4
Alkaline Phosphatase: 43
BUN: 21
CO2: 27
Chloride: 105
GFR calc non Af Amer: 49 — ABNORMAL LOW
Glucose, Bld: 111 — ABNORMAL HIGH
Potassium: 3.6
Total Bilirubin: 1

## 2011-09-21 LAB — DIFFERENTIAL
Eosinophils Absolute: 0.2
Eosinophils Relative: 5
Lymphs Abs: 1.7
Monocytes Absolute: 0.3
Monocytes Relative: 9

## 2011-09-21 LAB — URINALYSIS, ROUTINE W REFLEX MICROSCOPIC
Glucose, UA: NEGATIVE
Ketones, ur: NEGATIVE
Nitrite: NEGATIVE
Protein, ur: NEGATIVE

## 2011-09-21 LAB — CK TOTAL AND CKMB (NOT AT ARMC)
Relative Index: 0.5
Total CK: 229

## 2011-09-21 LAB — MAGNESIUM: Magnesium: 2.5

## 2011-09-21 LAB — POCT CARDIAC MARKERS
CKMB, poc: 2.1
Myoglobin, poc: 57.9

## 2011-09-21 LAB — ETHANOL: Alcohol, Ethyl (B): <5

## 2011-09-21 LAB — TROPONIN I: Troponin I: 0.02

## 2011-09-21 LAB — RAPID URINE DRUG SCREEN, HOSP PERFORMED: Benzodiazepines: NOT DETECTED

## 2011-09-25 ENCOUNTER — Telehealth: Payer: Self-pay | Admitting: *Deleted

## 2011-09-25 NOTE — Telephone Encounter (Signed)
Called and asked patient to have records faxed over from Dr Lindalou Hose office to pain center at [phone] and they will contact him with an appointment once they have received those records.Daisha Filosa, Rodena Medin

## 2011-09-26 ENCOUNTER — Other Ambulatory Visit: Payer: Self-pay | Admitting: Cardiology

## 2011-09-27 ENCOUNTER — Ambulatory Visit
Admission: RE | Admit: 2011-09-27 | Discharge: 2011-09-27 | Disposition: A | Discharge status: Home / Self Care | Payer: Medicaid Other | Source: Ambulatory Visit | Attending: Cardiothoracic Surgery | Admitting: Cardiothoracic Surgery

## 2011-09-27 ENCOUNTER — Ambulatory Visit (INDEPENDENT_AMBULATORY_CARE_PROVIDER_SITE_OTHER): Payer: Medicaid Other | Admitting: Cardiothoracic Surgery

## 2011-09-27 ENCOUNTER — Encounter: Payer: Self-pay | Admitting: Cardiothoracic Surgery

## 2011-09-27 VITALS — BP 156/86 | HR 74 | Resp 16 | Ht 73.0 in | Wt 187.0 lb

## 2011-09-27 DIAGNOSIS — Z951 Presence of aortocoronary bypass graft: Secondary | ICD-10-CM

## 2011-09-27 DIAGNOSIS — I2510 Atherosclerotic heart disease of native coronary artery without angina pectoris: Secondary | ICD-10-CM

## 2011-09-27 DIAGNOSIS — I7101 Dissection of thoracic aorta: Secondary | ICD-10-CM

## 2011-09-27 MED ORDER — IOHEXOL 300 MG/ML  SOLN
100.0000 mL | Freq: Once | INTRAMUSCULAR | Status: AC | PRN
  Administered 2011-09-27: 100 mL via INTRAVENOUS

## 2011-09-27 NOTE — Progress Notes (Signed)
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 The patient ejection fraction of 30-35% and is followed in the heart failure clinic by Dr. Gala Romney. The patient states he had a stress test within the past 3 weeks but does not know the results. The patient had originally CABG x7 by Dr. Lavinia Sharps in 2006 and a year ago had a stent placed in the right coronary artery. He has an Therapist, music. He denies chest pain or back pain. He does have some dyspnea on exertion.   Current Outpatient Prescriptions  Medication Sig Dispense Refill  . amLODipine (NORVASC) 10 MG tablet Take 10 mg by mouth daily.        [name] amoxicillin (AMOXIL) 500 MG capsule Take 4 caps 1 hour before dental appointment.      [name] aspirin 325 MG tablet Take 325 mg by mouth daily. Patient splits tablet in fourths       . atorvastatin (LIPITOR) 40 MG tablet Take 40 mg by mouth every other day.       . B Complex-C (B-COMPLEX WITH VITAMIN C) tablet Take 1 tablet by mouth daily.        . carvedilol (COREG) 25 MG tablet Take 1 tablet (25 mg total) by mouth 2 (two) times daily.  60 tablet  11  . digoxin (LANOXIN) 0.25 MG tablet Take 250 mcg by mouth daily.       . furosemide (LASIX) 80 MG tablet Take 1 tablet (80 mg total) by mouth as needed.  30 tablet  11  . hydrALAZINE (APRESOLINE) 25 MG tablet Take 75 mg by mouth 3 (three) times daily.       [name] HYDROcodone-acetaminophen (NORCO) 10-325 MG per tablet Take 1 tablet by mouth every 6 (six) hours as needed. May take every 4 to 6 hours as needed for severe pain       . isosorbide mononitrate (IMDUR) 30 MG 24 hr tablet Take 1 tablet (30 mg total) by mouth daily.  30 tablet  6  . lisinopril (PRINIVIL,ZESTRIL) 20 MG tablet Take 1 tablet (20 mg total) by mouth daily.  30 tablet  6  . nitroGLYCERIN (NITROSTAT) 0.4 MG SL tablet Place 1 tablet (0.4 mg total) under the  tongue every 5 (five) minutes as needed.  25 tablet  6  . PLAVIX 75 MG tablet TAKE ONE TABLET BY MOUTH EVERY DAY WITH FOOD  30 each  6  . potassium chloride SA (K-DUR,KLOR-CON) 20 MEQ tablet Take 20 mEq by mouth daily. 1 tab when pt takes lasix       . traZODone (DESYREL) 50 MG tablet Take 1 tablet (50 mg total) by mouth at bedtime as needed for sleep.  30 tablet  3  . vardenafil (LEVITRA) 20 MG tablet Take 20 mg by mouth daily as needed.         No current facility-administered medications for this visit.   Facility-Administered Medications Ordered in Other Visits  Medication Dose Route Frequency Provider Last Rate Last Dose  . iohexol (OMNIPAQUE) 300 MG/ML injection 100 mL  100 mL Intravenous Once PRN Medication Radiologist   100 mL at 09/27/11 0857     Review of Systems: Recently his mother died and he has had some insomnia. He denies ankle edema and he rarely uses Lasix. No neurologic symptoms.   Physical Exam  A pressure 156/86 pulse  or and regular saturation room air 96% weighs 185. General appearance is that of a delayed [place] American male in no acute distress. HEENT is normocephalic pupils are equal good carotid pulses bilaterally. Thorax is clear with a well-healed sternal incision. Cardiac rhythm is regular and there is no murmur or S3 gallop.  Vascular exam with full pulses in all extremities. Neurologic exam is nonfocal.  Diagnostic Tests: A CT angiogram of the thoracic aorta is reviewed. The descending aortic and arch graft is intact without pseudoaneurysm. There is a persistent false lumen the distal arch into the descending thoracic aorta. Descending thoracic aorta measures 4.2 cm. Descending aorta measures approximately 30 m in diameter. No significant pulmonary findings are noted. The official radiologic report is pending and will be reviewed.   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 The false lumen and size of  the remaining rest aorta is stable. 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 He is unable to have an MRI the thoracic aorta due to his AICD.   Plan The patient understands the importance of blood pressure control, weight control, and regular aerobic exercise. He'll return in 2 years with a followup CT angiogram of the thoracic ao

## 2011-09-27 NOTE — Patient Instructions (Signed)
The best treatment of the thoracic aortic disease is blood pressure control,weight control and regular walking/exercise.  You will be set-up for a CT scan of the aorta in 2 years since the aortic repair has been secure since 2010 and changing to every other year CT will reduce radiation exposure.

## 2011-09-29 LAB — COMPREHENSIVE METABOLIC PANEL
AST: 19
Albumin: 4
Chloride: 108
Creatinine, Ser: 1.48
GFR calc Af Amer: >60
Potassium: 3.7
Total Bilirubin: 1.3 — ABNORMAL HIGH
Total Protein: 6.9

## 2011-09-29 LAB — CBC
MCV: 80.9
Platelets: 135 — ABNORMAL LOW
WBC: 4.5

## 2011-09-29 LAB — CARDIAC PANEL(CRET KIN+CKTOT+MB+TROPI)
CK, MB: 1.4
CK, MB: 1.5
Total CK: 190
Total CK: 191
Troponin I: 0.04

## 2011-09-29 LAB — DIFFERENTIAL
Basophils Absolute: 0
Eosinophils Relative: 1
Lymphocytes Relative: 46
Monocytes Absolute: 0.5
Monocytes Relative: 12

## 2011-09-29 LAB — I-STAT 8, (EC8 V) (CONVERTED LAB)
Bicarbonate: 30.7 — ABNORMAL HIGH
Glucose, Bld: 104 — ABNORMAL HIGH
TCO2: 32
pCO2, Ven: 58.3 — ABNORMAL HIGH
pH, Ven: 7.33 — ABNORMAL HIGH

## 2011-09-29 LAB — B-NATRIURETIC PEPTIDE (CONVERTED LAB): Pro B Natriuretic peptide (BNP): 292 — ABNORMAL HIGH

## 2011-09-29 LAB — POCT CARDIAC MARKERS
CKMB, poc: 1.2
Myoglobin, poc: 81.5

## 2011-09-29 LAB — TSH: TSH: 0.766

## 2011-10-04 ENCOUNTER — Encounter: Payer: Medicaid Other | Admitting: *Deleted

## 2011-10-04 LAB — I-STAT 8, (EC8 V) (CONVERTED LAB)
Acid-Base Excess: 2
BUN: 10
Bicarbonate: 28.7 — ABNORMAL HIGH
Chloride: 104
Glucose, Bld: 95
HCT: 50
Hemoglobin: 17
Operator id: 288331
Potassium: 3.9
Sodium: 139
TCO2: 30
pCO2, Ven: 48.8
pH, Ven: 7.378 — ABNORMAL HIGH

## 2011-10-04 LAB — TROPONIN I
Troponin I: 0.04
Troponin I: 0.05
Troponin I: 0.06

## 2011-10-04 LAB — DIFFERENTIAL
Basophils Absolute: 0
Basophils Absolute: 0
Basophils Relative: 0
Basophils Relative: 1
Eosinophils Absolute: 0.3
Eosinophils Absolute: 0.3
Eosinophils Relative: 6 — ABNORMAL HIGH
Eosinophils Relative: 7 — ABNORMAL HIGH
Lymphocytes Relative: 25
Lymphocytes Relative: 36
Lymphs Abs: 1.3
Lymphs Abs: 1.5
Monocytes Absolute: 0.5
Monocytes Absolute: 0.5
Monocytes Relative: 10
Monocytes Relative: 11
Neutro Abs: 1.9
Neutro Abs: 3.1
Neutrophils Relative %: 45
Neutrophils Relative %: 59

## 2011-10-04 LAB — CK TOTAL AND CKMB (NOT AT ARMC)
CK, MB: 1.3
Relative Index: 0.8
Relative Index: 0.8
Relative Index: 1
Total CK: 124
Total CK: 131

## 2011-10-04 LAB — HEPATIC FUNCTION PANEL
Albumin: 4
Alkaline Phosphatase: 58
Indirect Bilirubin: 1 — ABNORMAL HIGH
Total Protein: 6.9

## 2011-10-04 LAB — CBC
HCT: 46
HCT: 48.1
Hemoglobin: 15.3
Hemoglobin: 15.9
MCHC: 33.1
MCHC: 33.2
MCV: 82.6
MCV: 83
Platelets: 147 — ABNORMAL LOW
Platelets: 155
RBC: 5.57
RBC: 5.79
RDW: 14
RDW: 14.2 — ABNORMAL HIGH
WBC: 4.2
WBC: 5.2

## 2011-10-04 LAB — BASIC METABOLIC PANEL
BUN: 10
Chloride: 103
GFR calc non Af Amer: >60
Potassium: 3.3 — ABNORMAL LOW
Sodium: 139

## 2011-10-04 LAB — POCT CARDIAC MARKERS
CKMB, poc: <1 — ABNORMAL LOW
Myoglobin, poc: 52
Operator id: 288331
Troponin i, poc: <0.05

## 2011-10-04 LAB — POCT I-STAT CREATININE
Creatinine, Ser: 1.3
Operator id: 288331

## 2011-10-04 LAB — B-NATRIURETIC PEPTIDE (CONVERTED LAB): Pro B Natriuretic peptide (BNP): 242 — ABNORMAL HIGH

## 2011-10-05 ENCOUNTER — Encounter: Payer: Medicaid Other | Admitting: *Deleted

## 2011-10-06 LAB — COMPREHENSIVE METABOLIC PANEL
Albumin: 4.2
Alkaline Phosphatase: 52
BUN: 15
Calcium: 9.9
Chloride: 105
Creatinine, Ser: 1.08
GFR calc Af Amer: >60
Glucose, Bld: 105 — ABNORMAL HIGH
Potassium: 3.6
Sodium: 140
Total Protein: 7.1

## 2011-10-06 LAB — CBC
HCT: 43.8
Hemoglobin: 14.8
MCHC: 33.8
Platelets: 157
RDW: 15 — ABNORMAL HIGH

## 2011-10-06 LAB — POCT CARDIAC MARKERS
CKMB, poc: 1.6
CKMB, poc: <1 — ABNORMAL LOW
Myoglobin, poc: 57.1
Myoglobin, poc: 59.9
Operator id: 270651
Operator id: 270651
Troponin i, poc: <0.05
Troponin i, poc: <0.05

## 2011-10-06 LAB — CARDIAC PANEL(CRET KIN+CKTOT+MB+TROPI)
CK, MB: 1.2
CK, MB: 1.2
Relative Index: 0.6
Total CK: 207

## 2011-10-06 LAB — TROPONIN I: Troponin I: 0.03

## 2011-10-06 LAB — I-STAT 8, (EC8 V) (CONVERTED LAB)
Acid-Base Excess: 1
BUN: 16
Bicarbonate: 26.8 — ABNORMAL HIGH
Chloride: 105
Glucose, Bld: 109 — ABNORMAL HIGH
HCT: 49
Hemoglobin: 16.7
Operator id: 284251
Potassium: 3.5
Sodium: 141
TCO2: 28
pCO2, Ven: 45
pH, Ven: 7.383 — ABNORMAL HIGH

## 2011-10-06 LAB — DIFFERENTIAL
Basophils Absolute: 0
Basophils Relative: 0
Eosinophils Relative: 3
Lymphocytes Relative: 42
Monocytes Absolute: 0.4
Neutro Abs: 1.7

## 2011-10-06 LAB — D-DIMER, QUANTITATIVE: D-Dimer, Quant: 0.22

## 2011-10-06 LAB — BASIC METABOLIC PANEL
BUN: 8
Calcium: 9.2
GFR calc non Af Amer: >60
Glucose, Bld: 101 — ABNORMAL HIGH
Sodium: 140

## 2011-10-06 LAB — PROTIME-INR
INR: 1
Prothrombin Time: 13.5

## 2011-10-06 LAB — POCT I-STAT CREATININE
Creatinine, Ser: 1.2
Operator id: 284251

## 2011-10-06 LAB — APTT: aPTT: 30

## 2011-10-06 LAB — DIGOXIN LEVEL: Digoxin Level: <0.2 — ABNORMAL LOW

## 2011-10-17 ENCOUNTER — Encounter: Payer: Self-pay | Admitting: Family Medicine

## 2011-10-17 ENCOUNTER — Ambulatory Visit (INDEPENDENT_AMBULATORY_CARE_PROVIDER_SITE_OTHER): Payer: Medicaid Other | Admitting: Family Medicine

## 2011-10-17 DIAGNOSIS — G4700 Insomnia, unspecified: Secondary | ICD-10-CM

## 2011-10-17 NOTE — Patient Instructions (Signed)
Thank you for coming in today. Please work on that sleep hygeine.  I know that TV is nice but I think it hurts more than it helps.  A fan or even a "white noise app" on your phone may be better.  See me in 3-6 months.  Good luck.

## 2011-10-17 NOTE — Progress Notes (Signed)
Here for follow up of Insomnia.  Doing well currently. Is working on sleep hygiene, but continues to use the television to fall asleep.  Got home with the trazodone but read the package insert and became afraid of side effects never used it. He is instead taking an over-the-counter diphenhydramine for sleep. Notes that he has nightmares and is afraid to fall asleep some nights. Overall he is not much bothered by this problem and feels well otherwise.  PMH reviewed.  ROS as above otherwise neg Medications reviewed.  Exam:  BP 130/80  Pulse 73  Ht 6' (1.829 m)  Wt 189 lb (85.73 kg)  BMI 25.63 kg/m2 Gen: Well NAD Lungs: CTABL Nl WOB Heart: RRR no MRG Psych: Normal eye contact and affect. Speech is normal. Thought process is linear and goal-directed. Well otherwise.

## 2011-10-17 NOTE — Assessment & Plan Note (Signed)
Doing well and over-the-counter intermittent diphenhydramine. Discussed the importance of good sleep hygiene. Advised that if he uses diphenhydramine more than a few times a week he should come back for further follow up. Plan to see patient back in 3-6 months or sooner if needed.

## 2011-11-03 IMAGING — CR DG CHEST 2V
2 series · 2 of 2 positions shown · non-contrast
Comparison: 04/17/2011

CLINICAL DATA: Preop left thyroid lobectomy

CHEST - 2 VIEW

[view not recorded (1 of 2)]
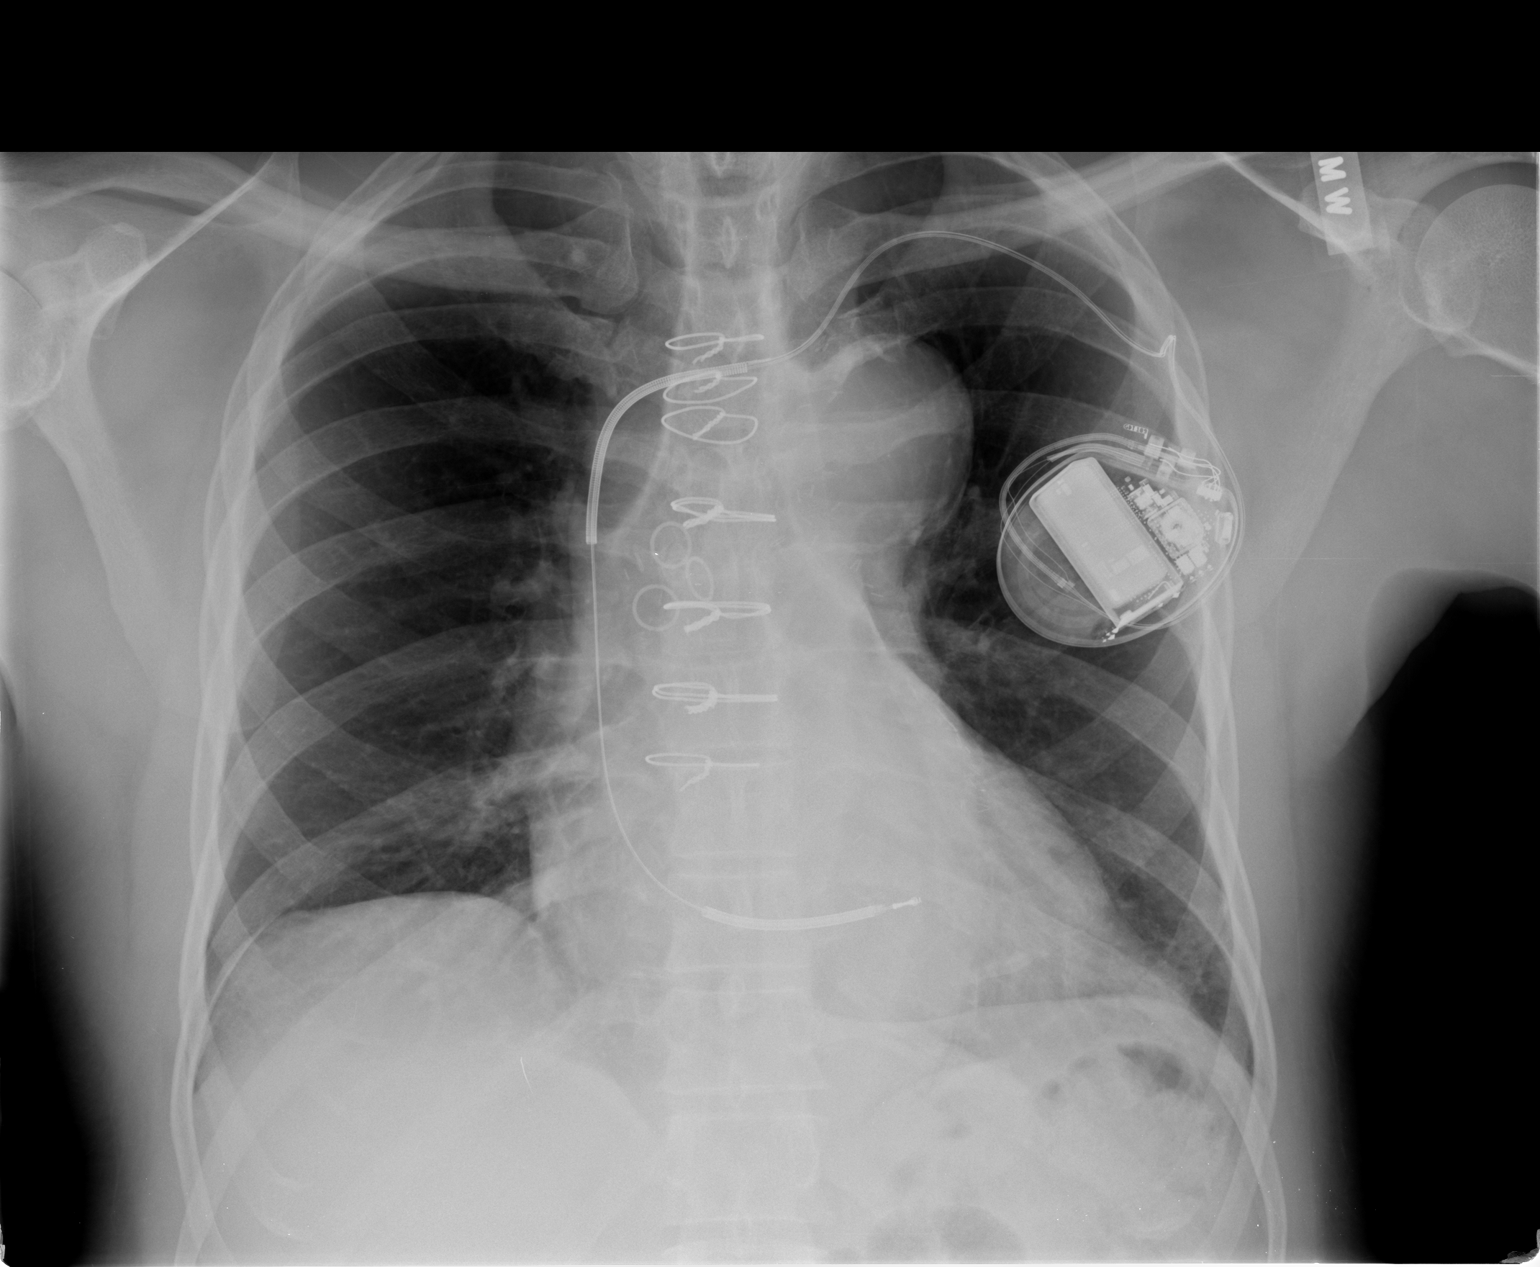

[view not recorded (2 of 2)]
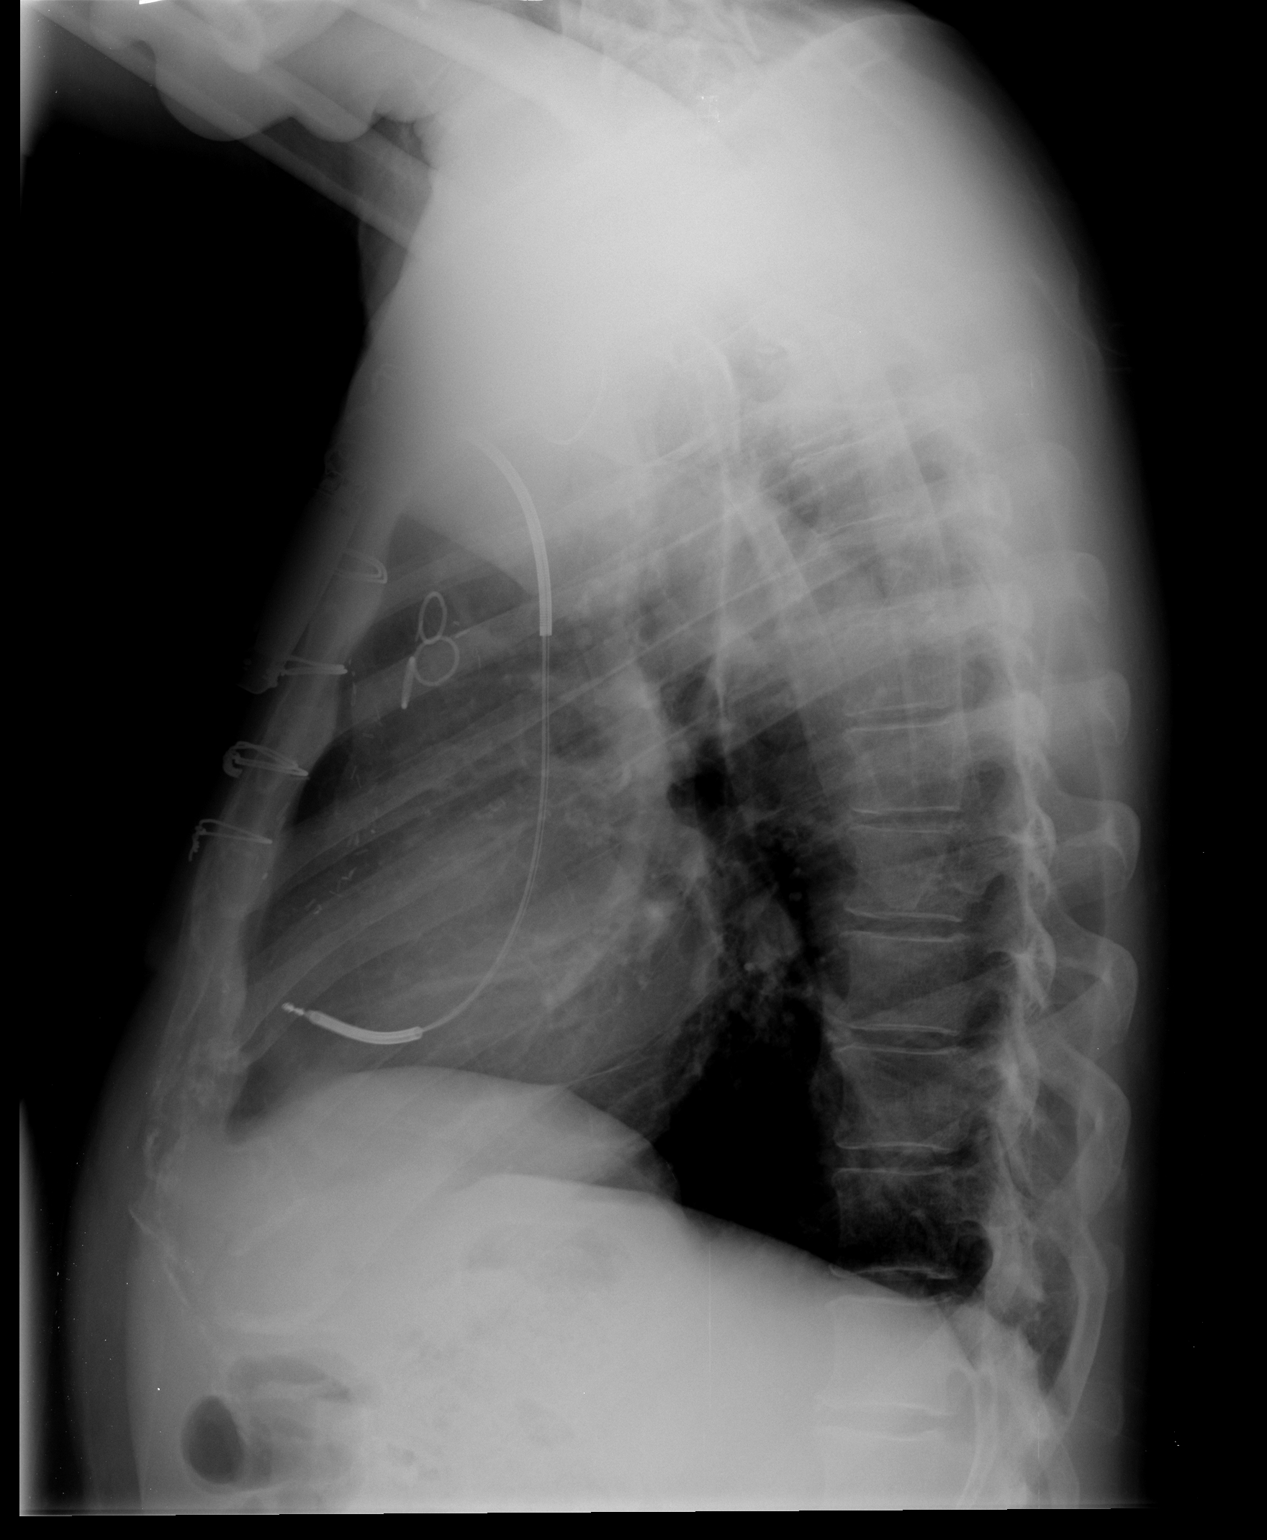

[2 of 2 positions shown; findings below may reference images not displayed]

FINDINGS: Prior CABG.  AICD is in place.  Heart size is mildly
enlarged.  Negative for heart failure.  Dilatation of the aorta is
noted and unchanged.  Negative for pneumonia or effusion.  Negative
for mass lesion.
IMPRESSION: No acute cardiopulmonary disease.

## 2011-11-14 IMAGING — CR DG CHEST 1V PORT
1 series · 1 of 1 positions shown · non-contrast
Comparison: 06/16/2011

CLINICAL DATA: Mid chest pain.

PORTABLE CHEST - 1 VIEW

[view not recorded]
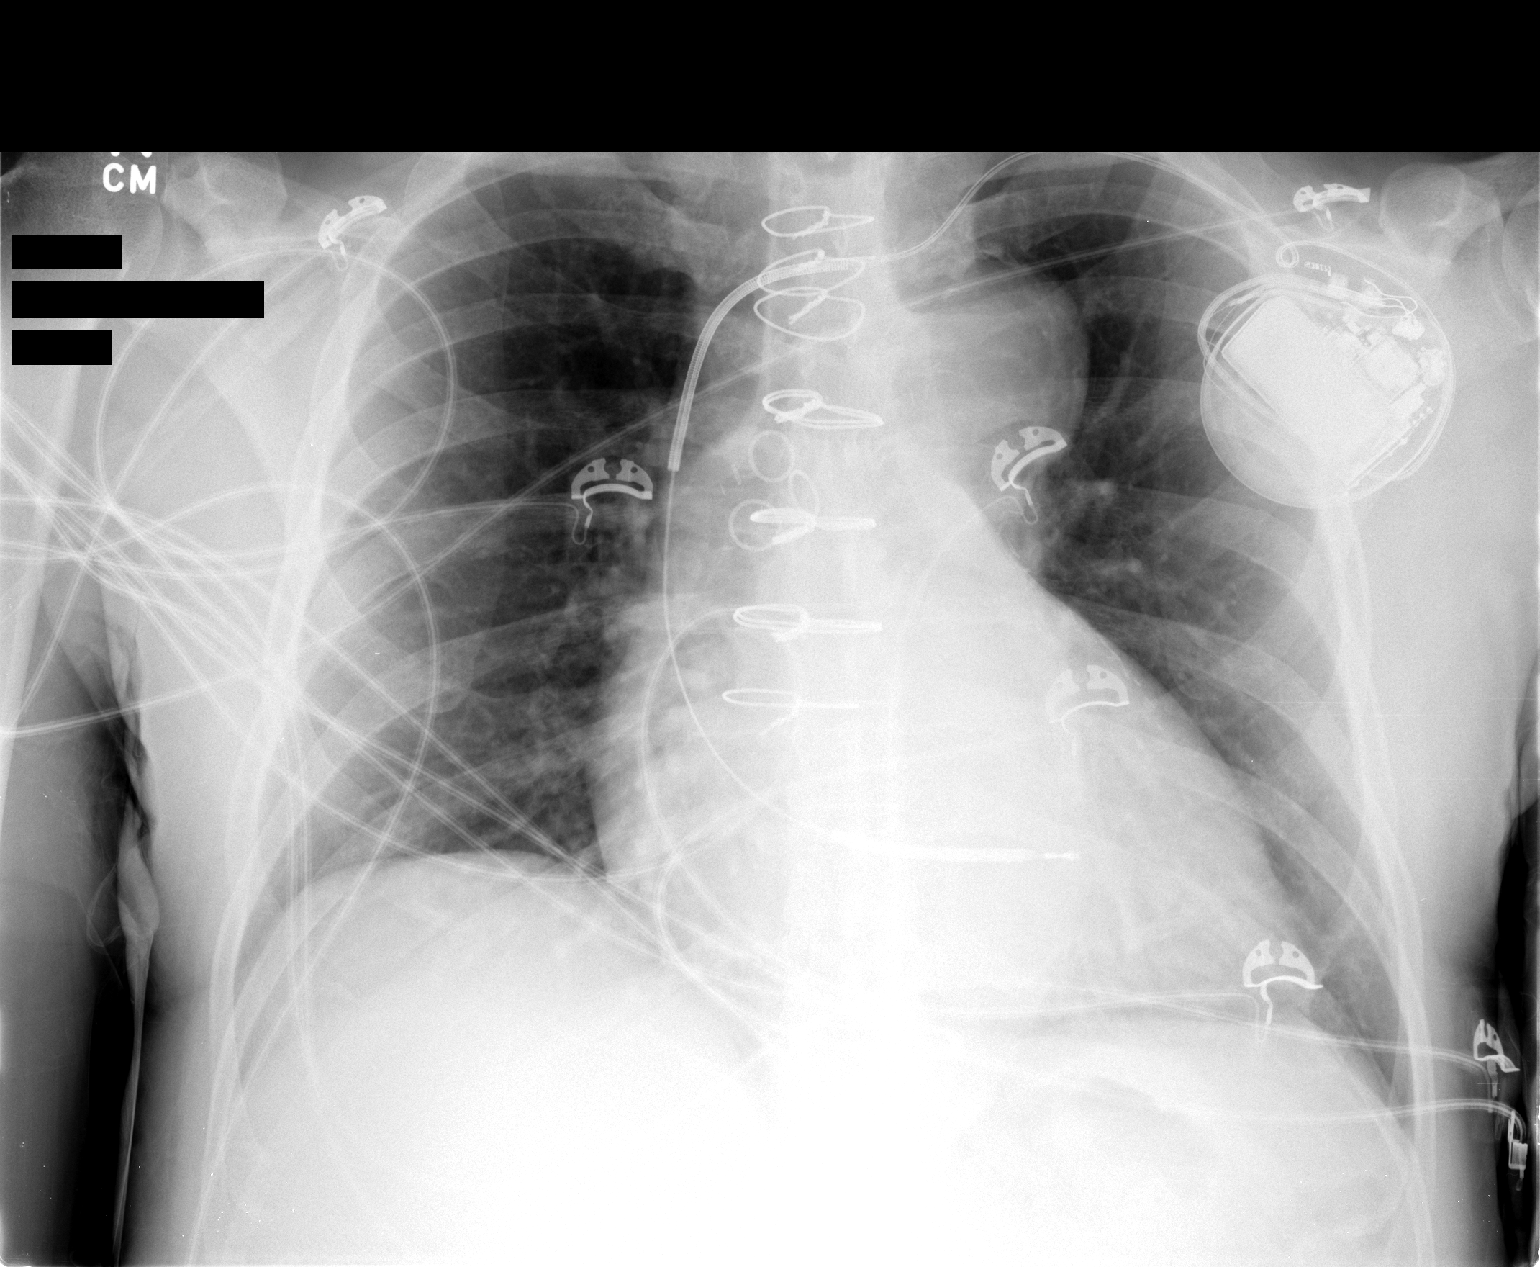

[1 of 1 positions shown; findings below may reference images not displayed]

FINDINGS: Aneurysmal dilatation of the aortic arch again noted,
stable.  Heart is borderline in size.  Left AICD is in place,
unchanged.  Lungs are clear.  No effusions.
IMPRESSION: Stable aortic arch aneurysm as seen on prior chest x-rays and CTs.

No active cardiopulmonary disease.

## 2011-11-20 ENCOUNTER — Ambulatory Visit (HOSPITAL_COMMUNITY)
Admission: RE | Admit: 2011-11-20 | Discharge: 2011-11-20 | Disposition: A | Discharge status: Home / Self Care | Payer: Medicaid Other | Source: Ambulatory Visit | Attending: Internal Medicine | Admitting: Internal Medicine

## 2011-11-20 DIAGNOSIS — E782 Mixed hyperlipidemia: Secondary | ICD-10-CM | POA: Insufficient documentation

## 2011-11-20 DIAGNOSIS — E785 Hyperlipidemia, unspecified: Secondary | ICD-10-CM

## 2011-11-20 DIAGNOSIS — I2510 Atherosclerotic heart disease of native coronary artery without angina pectoris: Secondary | ICD-10-CM

## 2011-11-20 DIAGNOSIS — I5022 Chronic systolic (congestive) heart failure: Secondary | ICD-10-CM

## 2011-11-20 DIAGNOSIS — I10 Essential (primary) hypertension: Secondary | ICD-10-CM

## 2011-11-20 DIAGNOSIS — I25810 Atherosclerosis of coronary artery bypass graft(s) without angina pectoris: Secondary | ICD-10-CM

## 2011-11-20 MED ORDER — LISINOPRIL 20 MG PO TABS: 20.0000 mg | ORAL_TABLET | Freq: Every day | ORAL | Status: DC

## 2011-11-20 MED ORDER — HYDRALAZINE HCL 25 MG PO TABS: 75.0000 mg | ORAL_TABLET | Freq: Two times a day (BID) | ORAL | Status: DC

## 2011-11-20 NOTE — Assessment & Plan Note (Addendum)
Continue Lipitor. Will check cholesterol next month. Goal LDL < 70.

## 2011-11-20 NOTE — Assessment & Plan Note (Addendum)
No evidence of ischemia. Continue current regimen.   

## 2011-11-20 NOTE — Progress Notes (Signed)
Patient ID: Chad [name], male   DOB: 06-19-1961, 50 y.o.   MRN: [id number] History of Present Illness: Primary Cardiologist:  Dr. Arvilla Meres  Chad [name] is a 50 y.o. male with history of severe HTN, coronary artery disease status post previous myocardial infarction and bypass surgery in 2006 with DES to native PDA in 2011.  He also has a history congestive heart failure secondary to ischemic cardiomyopathy with EF in 30-35% range.  He is s/p single chamber ICD.  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 He underwent emergent repair of proximal aorta and reimplantation of his CABG grafts.  Carotids u/s in 2011 0-39% bilaterally with highly vascular lesion in left thyroid lobe.  Biopsy showed Hurthle cell lesion.  Saw [name] who is planning sub-total thyroidectomy on 06/20/11.   Cath 07/2010: 1. Severe three-vessel coronary artery disease with 3/5 patent grafts.   2. Subacute occlusion of the vein graft to the posterior descending       artery, which is likely the culprit vessel.   3. Successful percutaneous transluminal coronary angioplasty with       placement of a drug-eluting stent in the distal native right       coronary artery.     He was admitted 4/23-4/24/12 with chest pain.  Myocardial infarction was ruled out.  CP was felt to be musculoskeletal.   He has been intolerant to Zocor and Lipitor in the past.     Lab Results  Component Value Date   CHOL 208* 09/13/2011   CHOL  Value: 159        ATP III CLASSIFICATION:  <200     mg/dL   Desirable  846-962  mg/dL   Borderline High  >=952    mg/dL   High        8/41/3244   CHOL  Value: 136        ATP III CLASSIFICATION:  <200     mg/dL   Desirable  010-272  mg/dL   Borderline High  >=536    mg/dL   High        64/03/346   Lab Results  Component Value Date   HDL 46 09/13/2011   HDL 41 04/18/9562   HDL 42 87/04/6432   Lab Results  Component Value Date   LDLCALC 128* 09/13/2011     LDLCALC  Value: 107        Total Cholesterol/HDL:CHD Risk Coronary Heart Disease Risk Table                     Men   Women  1/2 Average Risk   3.4   3.3  Average Risk       5.0   4.4  2 X Average Risk   9.6   7.1  3 X Average Risk  23.4   11.0        Use the calculated Patient Ratio above and the CHD Risk Table to determine the patient's CHD Risk.        ATP III CLASSIFICATION (LDL):  <100     mg/dL   Optimal  295-188  mg/dL   Near or Above                    Optimal  130-159  mg/dL   Borderline  416-606  mg/dL   High  >301     mg/dL   Very  High* 04/18/2011   LDLCALC  Value: 76        Total Cholesterol/HDL:CHD Risk Coronary Heart Disease Risk Table                     Men   Women  1/2 Average Risk   3.4   3.3  Average Risk       5.0   4.4  2 X Average Risk   9.6   7.1  3 X Average Risk  23.4   11.0        Use the calculated Patient Ratio above and the CHD Risk Table to determine the patient's CHD Risk.        ATP III CLASSIFICATION (LDL):  <100     mg/dL   Optimal  409-811  mg/dL   Near or Above                    Optimal  130-159  mg/dL   Borderline  914-782  mg/dL   High  >956     mg/dL   Very High 21/02/864   Lab Results  Component Value Date   TRIG 171* 09/13/2011   TRIG 57 04/18/2011   TRIG 90 11/24/2010   Lab Results  Component Value Date   CHOLHDL 4.5 09/13/2011   CHOLHDL 3.9 04/18/2011   CHOLHDL 3.2 11/24/2010   Lab Results  Component Value Date   LDLDIRECT 120.1 01/14/2008   LDLDIRECT 147.3 03/05/2007    He is here for follow up. Feels great.  Denies SOB/PND/orthopnea. Denies lower extremity edema. He has been out of Lisinopril for 4 days. Continues to have chest pain every other day. He has not required He takes an extra Lasix if his weigh goes up 3 pounds in 24 hours.  Started working again.   Past Medical History  Diagnosis Date  . CAD (coronary artery disease)     a. s/p CABG 2006;  b. DES to PDA 2011 (cath: Dx not seen, dRCA/PDA tx with DES; S-PDA occluded (culprit), S-Dx occluded,  S-RI and OM ok, L-LAD ok  . HTN (hypertension)     severe  . Chronic systolic heart failure     a. echo 8/11: EF 35%, mod LVH, Grade 1 diast dysfxn, mild AI, mild MR, inf and post HK  . 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 CRI (chronic renal insufficiency)   . Thyroid cancer     Hertle Cell  . HLD (hyperlipidemia)   . Anemia   . Gout   . AICD (automatic cardioverter/defibrillator) present   . Carotid stenosis     dopplers 2011: 0-39% bilat.    Current Outpatient Prescriptions  Medication Sig Dispense Refill  . amLODipine (NORVASC) 10 MG tablet TAKE ONE TABLET BY MOUTH EVERY DAY  30 tablet  6  . amoxicillin (AMOXIL) 500 MG capsule Take 4 caps 1 hour before dental appointment.      [name] aspirin 325 MG tablet Take 325 mg by mouth daily. Patient splits tablet in fourths       . atorvastatin (LIPITOR) 40 MG tablet Take 40 mg by mouth every other day.       . B Complex-C (B-COMPLEX WITH VITAMIN C) tablet Take 1 tablet by mouth daily.        . carvedilol (COREG) 25 MG tablet Take 1 tablet (25 mg total) by mouth 2 (two) times daily.  60 tablet  11  .  digoxin (LANOXIN) 0.25 MG tablet Take 250 mcg by mouth daily.       . furosemide (LASIX) 80 MG tablet Take 1 tablet (80 mg total) by mouth as needed.  30 tablet  11  . hydrALAZINE (APRESOLINE) 25 MG tablet Take 75 mg by mouth 3 (three) times daily.       [name] HYDROcodone-acetaminophen (NORCO) 10-325 MG per tablet Take 1 tablet by mouth every 6 (six) hours as needed. May take every 4 to 6 hours as needed for severe pain       . isosorbide mononitrate (IMDUR) 30 MG 24 hr tablet Take 1 tablet (30 mg total) by mouth daily.  30 tablet  6  . lisinopril (PRINIVIL,ZESTRIL) 20 MG tablet Take 1 tablet (20 mg total) by mouth daily.  30 tablet  6  . nitroGLYCERIN (NITROSTAT) 0.4 MG SL tablet Place 1 tablet (0.4 mg total) under the tongue every 5 (five) minutes as needed.  25 tablet  6  . PLAVIX 75 MG tablet TAKE ONE TABLET BY MOUTH  EVERY DAY WITH FOOD  30 each  6  . potassium chloride SA (K-DUR,KLOR-CON) 20 MEQ tablet Take 20 mEq by mouth daily. 1 tab when pt takes lasix       . vardenafil (LEVITRA) 20 MG tablet Take 20 mg by mouth daily as needed.          Allergies  Allergen Reactions  . Lipitor (Atorvastatin Calcium) Anaphylaxis  . Iohexol      Desc: PT HAS ANAPHYLAXIS WITH CONTRAST MEDIA!, Onset Date: 29562130   Code: SOB, Desc: ok w/ 13hr prep//a.c., Onset Date: 86578469   . Latex     REACTION: Reaction not known  . Sulfonamide Derivatives   . Zocor (Simvastatin)     Vital Signs: BP 138/76  Pulse 76  Wt 188 lb (85.276 kg)  SpO2 100%  PHYSICAL EXAM: Well nourished, well developed, in no acute distress HEENT: normal Neck: JVP 5-6 Cardiac:  normal S1, S2; RRR; +s4 2/6 SEM RSB Lungs:  clear to auscultation bilaterally, no wheezing, rhonchi or rales Abd: soft, nontender, no hepatomegaly Ext: no edema Skin: warm and dry Neuro:  CNs 2-12 intact, no focal abnormalities noted    ASSESSMENT AND PLAN:

## 2011-11-20 NOTE — Assessment & Plan Note (Signed)
Blood pressure well controlled. Continue current regimen.  

## 2011-11-20 NOTE — Patient Instructions (Signed)
Follow up in 3 months  Please check cholesterol December 13 at [facility]

## 2011-11-20 NOTE — Assessment & Plan Note (Addendum)
NYHA II. Volume status stable. Compliant with medications. Follow up in 3 months  Patient seen and examined with Tonye Becket, NP. We discussed all aspects of the encounter. I agree with the assessment and plan as stated above.

## 2011-11-24 NOTE — Progress Notes (Signed)
Patient seen and examined with Amy Clegg, NP. We discussed all aspects of the encounter. I agree with the assessment and plan as stated above.   

## 2011-12-02 ENCOUNTER — Inpatient Hospital Stay (HOSPITAL_COMMUNITY)
Admission: EM | Admit: 2011-12-02 | Discharge: 2011-12-04 | DRG: 313 | Disposition: A | Discharge status: Home / Self Care | Payer: Medicaid Other | Attending: Internal Medicine | Admitting: Internal Medicine

## 2011-12-02 ENCOUNTER — Other Ambulatory Visit: Payer: Self-pay

## 2011-12-02 ENCOUNTER — Encounter (HOSPITAL_COMMUNITY): Payer: Self-pay | Admitting: *Deleted

## 2011-12-02 ENCOUNTER — Emergency Department (HOSPITAL_COMMUNITY): Payer: Medicaid Other

## 2011-12-02 DIAGNOSIS — R55 Syncope and collapse: Secondary | ICD-10-CM | POA: Diagnosis present

## 2011-12-02 DIAGNOSIS — I2510 Atherosclerotic heart disease of native coronary artery without angina pectoris: Secondary | ICD-10-CM | POA: Diagnosis present

## 2011-12-02 DIAGNOSIS — R001 Bradycardia, unspecified: Secondary | ICD-10-CM

## 2011-12-02 DIAGNOSIS — I10 Essential (primary) hypertension: Secondary | ICD-10-CM | POA: Diagnosis present

## 2011-12-02 DIAGNOSIS — I5022 Chronic systolic (congestive) heart failure: Secondary | ICD-10-CM | POA: Diagnosis present

## 2011-12-02 DIAGNOSIS — R0789 Other chest pain: Principal | ICD-10-CM | POA: Diagnosis present

## 2011-12-02 DIAGNOSIS — Z85850 Personal history of malignant neoplasm of thyroid: Secondary | ICD-10-CM

## 2011-12-02 DIAGNOSIS — R079 Chest pain, unspecified: Secondary | ICD-10-CM | POA: Insufficient documentation

## 2011-12-02 DIAGNOSIS — I129 Hypertensive chronic kidney disease with stage 1 through stage 4 chronic kidney disease, or unspecified chronic kidney disease: Secondary | ICD-10-CM | POA: Diagnosis present

## 2011-12-02 DIAGNOSIS — R0602 Shortness of breath: Secondary | ICD-10-CM | POA: Diagnosis present

## 2011-12-02 DIAGNOSIS — Z7982 Long term (current) use of aspirin: Secondary | ICD-10-CM

## 2011-12-02 DIAGNOSIS — I6529 Occlusion and stenosis of unspecified carotid artery: Secondary | ICD-10-CM | POA: Diagnosis present

## 2011-12-02 DIAGNOSIS — M5430 Sciatica, unspecified side: Secondary | ICD-10-CM | POA: Insufficient documentation

## 2011-12-02 DIAGNOSIS — E785 Hyperlipidemia, unspecified: Secondary | ICD-10-CM | POA: Diagnosis present

## 2011-12-02 DIAGNOSIS — Z95810 Presence of automatic (implantable) cardiac defibrillator: Secondary | ICD-10-CM

## 2011-12-02 DIAGNOSIS — Z951 Presence of aortocoronary bypass graft: Secondary | ICD-10-CM

## 2011-12-02 DIAGNOSIS — M109 Gout, unspecified: Secondary | ICD-10-CM | POA: Diagnosis present

## 2011-12-02 DIAGNOSIS — I25810 Atherosclerosis of coronary artery bypass graft(s) without angina pectoris: Secondary | ICD-10-CM | POA: Diagnosis present

## 2011-12-02 DIAGNOSIS — M549 Dorsalgia, unspecified: Secondary | ICD-10-CM | POA: Diagnosis present

## 2011-12-02 DIAGNOSIS — M5431 Sciatica, right side: Secondary | ICD-10-CM | POA: Insufficient documentation

## 2011-12-02 DIAGNOSIS — G8929 Other chronic pain: Secondary | ICD-10-CM | POA: Diagnosis present

## 2011-12-02 DIAGNOSIS — N182 Chronic kidney disease, stage 2 (mild): Secondary | ICD-10-CM | POA: Diagnosis present

## 2011-12-02 HISTORY — DX: Chest pain, unspecified: R07.9

## 2011-12-02 LAB — COMPREHENSIVE METABOLIC PANEL
Albumin: 3.8 g/dL (ref 3.5–5.2)
Alkaline Phosphatase: 57 U/L (ref 39–117)
BUN: 13 mg/dL (ref 6–23)
Chloride: 106 mEq/L (ref 96–112)
Creatinine, Ser: 1.29 mg/dL (ref 0.50–1.35)
GFR calc Af Amer: 74 mL/min — ABNORMAL LOW (ref >90)
Glucose, Bld: 97 mg/dL (ref 70–99)
Potassium: 3.3 mEq/L — ABNORMAL LOW (ref 3.5–5.1)
Total Bilirubin: 0.4 mg/dL (ref 0.3–1.2)
Total Protein: 7 g/dL (ref 6.0–8.3)

## 2011-12-02 LAB — CBC
HCT: 39.4 % (ref 39.0–52.0)
HCT: 37.5 % — ABNORMAL LOW (ref 39.0–52.0)
Hemoglobin: 13.1 g/dL (ref 13.0–17.0)
Hemoglobin: 12.2 g/dL — ABNORMAL LOW (ref 13.0–17.0)
MCH: 27.2 pg (ref 26.0–34.0)
MCH: 26.9 pg (ref 26.0–34.0)
MCHC: 33.2 g/dL (ref 30.0–36.0)
MCHC: 32.5 g/dL (ref 30.0–36.0)
MCV: 81.9 fL (ref 78.0–100.0)
Platelets: 168 10*3/uL (ref 150–400)
RBC: 4.81 MIL/uL (ref 4.22–5.81)
RBC: 4.54 MIL/uL (ref 4.22–5.81)
RDW: 13.1 % (ref 11.5–15.5)
WBC: 2.9 10*3/uL — ABNORMAL LOW (ref 4.0–10.5)

## 2011-12-02 LAB — MAGNESIUM: Magnesium: 2.3 mg/dL (ref 1.5–2.5)

## 2011-12-02 LAB — CARDIAC PANEL(CRET KIN+CKTOT+MB+TROPI)
CK, MB: 3 ng/mL (ref 0.3–4.0)
CK, MB: 2.8 ng/mL (ref 0.3–4.0)
Relative Index: 1.8 (ref 0.0–2.5)
Total CK: 163 U/L (ref 7–232)
Troponin I: <0.3 ng/mL (ref <0.30)

## 2011-12-02 LAB — DIFFERENTIAL
Basophils Relative: 0 % (ref 0–1)
Eosinophils Absolute: 0.2 10*3/uL (ref 0.0–0.7)
Lymphs Abs: 1.6 10*3/uL (ref 0.7–4.0)
Monocytes Absolute: 0.3 10*3/uL (ref 0.1–1.0)
Monocytes Relative: 10 % (ref 3–12)

## 2011-12-02 LAB — BASIC METABOLIC PANEL
BUN: 13 mg/dL (ref 6–23)
CO2: 28 mEq/L (ref 19–32)
Calcium: 9.1 mg/dL (ref 8.4–10.5)
Chloride: 103 mEq/L (ref 96–112)
Creatinine, Ser: 1.22 mg/dL (ref 0.50–1.35)
GFR calc Af Amer: 79 mL/min — ABNORMAL LOW (ref >90)
GFR calc non Af Amer: 69 mL/min — ABNORMAL LOW (ref >90)
Glucose, Bld: 132 mg/dL — ABNORMAL HIGH (ref 70–99)
Potassium: 3.5 mEq/L (ref 3.5–5.1)
Sodium: 140 mEq/L (ref 135–145)

## 2011-12-02 MED ORDER — CLOPIDOGREL BISULFATE 75 MG PO TABS: 75.0000 mg | ORAL_TABLET | Freq: Every day | ORAL | Status: DC

## 2011-12-02 MED ORDER — NITROGLYCERIN IN D5W 200-5 MCG/ML-% IV SOLN: 3.0000 ug/min | INTRAVENOUS | Status: DC

## 2011-12-02 MED ORDER — HYDROCODONE-ACETAMINOPHEN 10-325 MG PO TABS
1.0000 | ORAL_TABLET | ORAL | Status: DC | PRN
  Administered 2011-12-03 – 2011-12-04 (×8): 2 via ORAL
  Filled 2011-12-02 (×8): qty 2

## 2011-12-02 MED ORDER — ACETAMINOPHEN 325 MG PO TABS
650.0000 mg | ORAL_TABLET | ORAL | Status: DC | PRN
  Administered 2011-12-02: 325 mg via ORAL
  Filled 2011-12-02: qty 1

## 2011-12-02 MED ORDER — DIGOXIN 250 MCG PO TABS
250.0000 ug | ORAL_TABLET | Freq: Every day | ORAL | Status: DC
Start: 2011-12-02 — End: 2011-12-04
  Administered 2011-12-03 – 2011-12-04 (×2): 250 ug via ORAL
  Filled 2011-12-02 (×3): qty 1

## 2011-12-02 MED ORDER — ONDANSETRON HCL 4 MG/2ML IJ SOLN: 4.0000 mg | Freq: Four times a day (QID) | INTRAMUSCULAR | Status: DC | PRN

## 2011-12-02 MED ORDER — HYDRALAZINE HCL 20 MG/ML IJ SOLN
10.0000 mg | INTRAMUSCULAR | Status: DC | PRN
  Administered 2011-12-03: 10 mg via INTRAVENOUS
  Filled 2011-12-02 (×2): qty 1

## 2011-12-02 MED ORDER — HEPARIN BOLUS VIA INFUSION
4000.0000 [IU] | Freq: Once | INTRAVENOUS | Status: AC
  Administered 2011-12-02: 4000 [IU] via INTRAVENOUS
  Filled 2011-12-02: qty 4000

## 2011-12-02 MED ORDER — ISOSORBIDE MONONITRATE ER 30 MG PO TB24
30.0000 mg | ORAL_TABLET | Freq: Every day | ORAL | Status: DC
  Administered 2011-12-02 – 2011-12-04 (×3): 30 mg via ORAL
  Filled 2011-12-02 (×3): qty 1

## 2011-12-02 MED ORDER — B COMPLEX-C PO TABS
1.0000 | ORAL_TABLET | Freq: Every day | ORAL | Status: DC
  Administered 2011-12-02 – 2011-12-04 (×3): 1 via ORAL
  Filled 2011-12-02 (×3): qty 1

## 2011-12-02 MED ORDER — AMLODIPINE BESYLATE 5 MG PO TABS: 5.0000 mg | ORAL_TABLET | Freq: Every day | ORAL | Status: DC

## 2011-12-02 MED ORDER — NITROGLYCERIN 0.4 MG SL SUBL: 0.4000 mg | SUBLINGUAL_TABLET | SUBLINGUAL | Status: DC | PRN

## 2011-12-02 MED ORDER — POTASSIUM CHLORIDE CRYS ER 20 MEQ PO TBCR
40.0000 meq | EXTENDED_RELEASE_TABLET | Freq: Once | ORAL | Status: AC
  Administered 2011-12-03: 40 meq via ORAL
  Filled 2011-12-02: qty 2

## 2011-12-02 MED ORDER — HYDROCODONE-ACETAMINOPHEN 10-325 MG PO TABS: 1.0000 | ORAL_TABLET | Freq: Four times a day (QID) | ORAL | Status: DC | PRN

## 2011-12-02 MED ORDER — ASPIRIN 325 MG PO TABS
325.0000 mg | ORAL_TABLET | Freq: Every day | ORAL | Status: DC
  Administered 2011-12-03 – 2011-12-04 (×2): 325 mg via ORAL
  Filled 2011-12-02 (×2): qty 1

## 2011-12-02 MED ORDER — HYDROCODONE-ACETAMINOPHEN 5-325 MG PO TABS
2.0000 | ORAL_TABLET | Freq: Once | ORAL | Status: AC
  Administered 2011-12-02: 2 via ORAL
  Filled 2011-12-02: qty 2

## 2011-12-02 MED ORDER — HYDROCODONE-ACETAMINOPHEN 10-325 MG PO TABS
1.0000 | ORAL_TABLET | ORAL | Status: DC | PRN
  Administered 2011-12-02: 1 via ORAL
  Filled 2011-12-02: qty 1

## 2011-12-02 MED ORDER — HYDRALAZINE HCL 50 MG PO TABS
75.0000 mg | ORAL_TABLET | Freq: Three times a day (TID) | ORAL | Status: DC
  Administered 2011-12-02 – 2011-12-03 (×2): 75 mg via ORAL
  Administered 2011-12-03: 50 mg via ORAL
  Administered 2011-12-03 – 2011-12-04 (×2): 75 mg via ORAL
  Filled 2011-12-02 (×8): qty 1

## 2011-12-02 MED ORDER — ASPIRIN 81 MG PO CHEW: 324.0000 mg | CHEWABLE_TABLET | ORAL | Status: AC

## 2011-12-02 MED ORDER — CLOPIDOGREL BISULFATE 75 MG PO TABS
75.0000 mg | ORAL_TABLET | Freq: Every day | ORAL | Status: DC
  Administered 2011-12-03 – 2011-12-04 (×2): 75 mg via ORAL
  Filled 2011-12-02 (×2): qty 1

## 2011-12-02 MED ORDER — CARVEDILOL 25 MG PO TABS
25.0000 mg | ORAL_TABLET | Freq: Two times a day (BID) | ORAL | Status: DC
  Administered 2011-12-02 – 2011-12-04 (×4): 25 mg via ORAL
  Filled 2011-12-02 (×5): qty 1

## 2011-12-02 MED ORDER — ASPIRIN EC 81 MG PO TBEC: 81.0000 mg | DELAYED_RELEASE_TABLET | Freq: Every day | ORAL | Status: DC

## 2011-12-02 MED ORDER — LISINOPRIL 20 MG PO TABS
20.0000 mg | ORAL_TABLET | Freq: Every day | ORAL | Status: DC
  Administered 2011-12-03 – 2011-12-04 (×2): 20 mg via ORAL
  Filled 2011-12-02 (×3): qty 1

## 2011-12-02 MED ORDER — NITROGLYCERIN IN D5W 200-5 MCG/ML-% IV SOLN
5.0000 ug/min | Freq: Once | INTRAVENOUS | Status: AC
  Administered 2011-12-02: 5 ug/min via INTRAVENOUS
  Filled 2011-12-02: qty 250

## 2011-12-02 MED ORDER — AMLODIPINE BESYLATE 10 MG PO TABS
10.0000 mg | ORAL_TABLET | Freq: Every day | ORAL | Status: DC
  Administered 2011-12-03 – 2011-12-04 (×2): 10 mg via ORAL
  Filled 2011-12-02 (×3): qty 1

## 2011-12-02 MED ORDER — HEPARIN SOD (PORCINE) IN D5W 100 UNIT/ML IV SOLN
1400.0000 [IU]/h | INTRAVENOUS | Status: DC
  Administered 2011-12-02: 1150 [IU]/h via INTRAVENOUS
  Administered 2011-12-03 – 2011-12-04 (×2): 1400 [IU]/h via INTRAVENOUS
  Filled 2011-12-02 (×4): qty 250

## 2011-12-02 NOTE — Consult Note (Signed)
ANTICOAGULATION CONSULT NOTE - Initial Consult  Pharmacy Consult for Heparin Indication: ACS  Allergies  Allergen Reactions  . Lipitor (Atorvastatin Calcium) Anaphylaxis  . Sulfonamide Derivatives Shortness Of Breath  . Iohexol      Desc: PT HAS ANAPHYLAXIS WITH CONTRAST MEDIA!, Onset Date: 16109604   Code: SOB, Desc: ok w/ 13hr prep//a.c., Onset Date: 54098119   . Latex     REACTION: Reaction not known  . Zocor (Simvastatin)     Patient Measurements:     Vital Signs: Temp: 98.6 F (37 C) (12/08 1258) Temp src: Oral (12/08 1258) BP: 146/86 mmHg (12/08 1258) Pulse Rate: 88  (12/08 1258)  Labs:  Basename 12/02/11 1400 12/02/11 1355  HGB -- 13.1  HCT -- 39.4  PLT -- 168  APTT -- --  LABPROT -- --  INR -- --  HEPARINUNFRC -- --  CREATININE -- 1.22  CKTOTAL 163 --  CKMB 3.0 --  TROPONINI <0.30 --   The CrCl is unknown because both a height and weight (above a minimum accepted value) are required for this calculation.  Medical History: Past Medical History  Diagnosis Date  . CAD (coronary artery disease)     a. s/p CABG 2006;  b. DES to PDA 2011 (cath: Dx not seen, dRCA/PDA tx with DES; S-PDA occluded (culprit), S-Dx occluded, S-RI and OM ok, L-LAD ok  . HTN (hypertension)     severe  . Chronic systolic heart failure     a. echo 8/11: EF 35%, mod LVH, Grade 1 diast dysfxn, mild AI, mild MR, inf and post HK  . 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 CRI (chronic renal insufficiency)   . Thyroid cancer     Hertle Cell  . HLD (hyperlipidemia)   . Anemia   . Gout   . AICD (automatic cardioverter/defibrillator) present   . Carotid stenosis     dopplers 2011: 0-39% bilat.    Medications:  Pending Home Med History Patient does take ASA + Plavix  Scheduled:  Pending   Infusions:    . nitroGLYCERIN  5 mcg/min Intravenous Once    Assessment: 50 year old male who has had prior coronary artery bypass grafts and coronary stents  states that for the last week he's been having intermittent chest pain similar to prior cardiac pains. His initial workup is negative for acute cardiac injury. He will be treated as a possible unstable angina patient until proven otherwise.  Goal of Therapy:  Heparin level 0.3-0.7 units/ml   Plan:  Baseline PT/INT, PTT, CBC. Heparin 4000 unit load Begin Heparin infusion @ 1150 units/hr. Follow-up Heparin level, CBC in 6 hours.  Kenon Delashmit, Elisha Headland, Pharm.D. 12/02/2011  3:44 PM

## 2011-12-02 NOTE — ED Notes (Signed)
PT reports CP for 3-4  Days . Pain in LT chest wall that feels like electrical situation as PT reports.

## 2011-12-02 NOTE — H&P (Signed)
Admit date: 12/02/2011 Referring Physician Dr. Nino Parsley  Primary Cardiologist Dr. Gala Romney Chief complaint/reason for admission: Chest Pain  HPI: This is a 50yo AAM with a history of CAD s/p CABG in 2006 and then DES to the PDA in 2011, chronic systolic HF with EF 35%, and aortic dissection repair who was in his usual state of health until a few weeks ago when he started having chest pain.  The pain has been daily but episodic and is improved with SL NTG.  The pain is left sided with radiation down his arms and into his back.  He has had some SOB.  He denies any fever, chills, nausea or diarrhea.  He presented to the ER today due to ongoing chest pain.    PMH:    Past Medical History  Diagnosis Date  . CAD (coronary artery disease)     a. s/p CABG 2006;  b. DES to PDA 2011 (cath: Dx not seen, dRCA/PDA tx with DES; S-PDA occluded (culprit), S-Dx occluded, S-RI and OM ok, L-LAD ok  . HTN (hypertension)     severe  . Chronic systolic heart failure     a. echo 8/11: EF 35%, mod LVH, Grade 1 diast dysfxn, mild AI, mild MR, inf and post HK  . 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 CRI (chronic renal insufficiency)   . Thyroid cancer     Hertle Cell  . HLD (hyperlipidemia)   . Anemia   . Gout   . AICD (automatic cardioverter/defibrillator) present   . Carotid stenosis     dopplers 2011: 0-39% bilat.    PSH:    Past Surgical History  Procedure Date  . Coronary artery bypass graft 06/21/2009  . Cardiac defibrillator placement     [employer]  . Thyroidectomy, partial 06/20/11  . Lumbar disc surgery   . Status post emergency repair of a type a ascending aortic dissection with a hemiarch reconstruction of the ascending aorta  using a 28-mm hemashield graft with redo sternotomy and revision of previous bypass grafts in june 2010.   . Median sternotomy,cabg x 7 06/29/2005    ALLERGIES:   Lipitor; Sulfonamide derivatives; Iohexol; Latex; and Zocor  Prior  to Admit Meds:   (Not in a hospital admission) Family HX:    Family History  Problem Relation Age of Onset  . Coronary artery disease    . Hypertension Father   . Heart disease Father   . Early death Father   . COPD Father   . Hypertension Mother    Social HX:    History   Social History  . Marital Status: Divorced    Spouse Name: N/A    Number of Children: N/A  . Years of Education: N/A   Occupational History  . disabled    Social History Main Topics  . Smoking status: Never Smoker   . Smokeless tobacco: Never Used  . Alcohol Use: No  . Drug Use: No  . Sexually Active: Yes   Other Topics Concern  . Not on file   Social History Narrative  . No narrative on file     ROS:  All 11 ROS were addressed and are negative except what is stated in the HPI  PHYSICAL EXAM Filed Vitals:   12/02/11 1258  BP: 146/86  Pulse: 88  Temp: 98.6 F (37 C)  Resp: 18   General: Well developed, well nourished, in no acute distress Head: Eyes PERRLA,  No xanthomas.   Normal cephalic and atramatic  Lungs:   Clear bilaterally to auscultation and percussion. Heart:   HRRR S1 S2 Pulses are 2+ & equal.            No carotid bruit. No JVD.  No abdominal bruits. No femoral bruits. Abdomen: Bowel sounds are positive, abdomen soft and non-tender without masses or                  Hernia's noted. Msk:  Back normal, normal gait. Normal strength and tone for age. Extremities:   No clubbing, cyanosis or edema.  DP +1 Neuro: Alert and oriented X 3. Psych:  Good affect, responds appropriately   Labs:   Lab Results  Component Value Date   WBC 2.9* 12/02/2011   HGB 13.1 12/02/2011   HCT 39.4 12/02/2011   MCV 81.9 12/02/2011   PLT 168 12/02/2011    Lab 12/02/11 1355  NA 140  K 3.5  CL 103  CO2 28  BUN 13  CREATININE 1.22  CALCIUM 9.1  PROT --  BILITOT --  ALKPHOS --  ALT --  AST --  GLUCOSE 132*   Lab Results  Component Value Date   CKTOTAL 163 12/02/2011   CKMB 3.0 12/02/2011     TROPONINI <0.30 12/02/2011   No results found for this basename: PTT   Lab Results  Component Value Date   INR 0.99 06/27/2011   INR 0.97 06/16/2011   INR 1.04 04/17/2011     Lab Results  Component Value Date   CHOL 208* 09/13/2011   CHOL  Value: 159        ATP III CLASSIFICATION:  <200     mg/dL   Desirable  409-811  mg/dL   Borderline High  >=914    mg/dL   High        7/82/9562   CHOL  Value: 136        ATP III CLASSIFICATION:  <200     mg/dL   Desirable  130-865  mg/dL   Borderline High  >=784    mg/dL   High        69/05/2951   Lab Results  Component Value Date   HDL 46 09/13/2011   HDL 41 8/41/3244   HDL 42 01/0/2725   Lab Results  Component Value Date   LDLCALC 128* 09/13/2011   LDLCALC  Value: 107        Total Cholesterol/HDL:CHD Risk Coronary Heart Disease Risk Table                     Men   Women  1/2 Average Risk   3.4   3.3  Average Risk       5.0   4.4  2 X Average Risk   9.6   7.1  3 X Average Risk  23.4   11.0        Use the calculated Patient Ratio above and the CHD Risk Table to determine the patient's CHD Risk.        ATP III CLASSIFICATION (LDL):  <100     mg/dL   Optimal  366-440  mg/dL   Near or Above                    Optimal  130-159  mg/dL   Borderline  347-425  mg/dL   High  >956     mg/dL   Very High* 3/87/5643  LDLCALC  Value: 76        Total Cholesterol/HDL:CHD Risk Coronary Heart Disease Risk Table                     Men   Women  1/2 Average Risk   3.4   3.3  Average Risk       5.0   4.4  2 X Average Risk   9.6   7.1  3 X Average Risk  23.4   11.0        Use the calculated Patient Ratio above and the CHD Risk Table to determine the patient's CHD Risk.        ATP III CLASSIFICATION (LDL):  <100     mg/dL   Optimal  161-096  mg/dL   Near or Above                    Optimal  130-159  mg/dL   Borderline  045-409  mg/dL   High  >811     mg/dL   Very High 91/03/7828   Lab Results  Component Value Date   TRIG 171* 09/13/2011   TRIG 57 04/18/2011   TRIG 90 11/24/2010    Lab Results  Component Value Date   CHOLHDL 4.5 09/13/2011   CHOLHDL 3.9 04/18/2011   CHOLHDL 3.2 11/24/2010   Lab Results  Component Value Date   LDLDIRECT 120.1 01/14/2008   LDLDIRECT 147.3 03/05/2007      Radiology:  *RADIOLOGY REPORT*  Clinical Data: Chest pain. Coronary artery disease. Thoracic  aortic dissection.  CHEST - 2 VIEW  Comparison: 06/27/2011  Findings: AICD remains in place. Mild cardiomegaly stable.  Ectasia of the thoracic aorta is also unchanged.  There is no evidence of pulmonary infiltrate or edema. No evidence  of pleural effusion. Previous CABG again noted.  IMPRESSION:  Stable mild cardiomegaly and ectatic thoracic aorta. No active  lung disease.  Original Report Authenticated By: Danae Orleans, M.D.   EKG:  NSR  ASSESSMENT:  1.  USAP with negative cardiac enzymes currently on IV NTG gtt and IV Heparin gtt 2.  CAD s/p remote CABG 2006 and PCI of RCA 2011, reimplantation of CABG grafts at time of aortic root repair.  Cath 07/2010 with 3/5 grafts patent with subacute occlusion of SVG to PDA and PCI with DES 3.  S/p ascending aortic arch repair from acute type I aortic dissection with extension to his iliacs and left kidney 2010 4.  Ischemic DCM s/p AICD 2006 EF 35% echo 2011 with inferior scar by myocardial perfusion imaging in 05/2011 and ? Mid anterior ischemia with EF 31%   5.  HTN   6.  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  Chronic systolic HF and grade I diastolic dysfunction 8.  Chronic renal insufficiency 9.  Dyslipidemia 10.  Anemia 11. Gout  PLAN:   1.  Admit to telemetry bed 2.  Cycle cardiac enzymes 3.  IV Heparin gtt per pharmacy protocol 4.  IV NTG gtt for CP 5.  ASA 6.  Continue home meds including statin/ASA/Plavix 7.  Further workup per [facility] - patient states he does not want another cath  Quintella Reichert, MD  12/02/2011  4:45 PM

## 2011-12-02 NOTE — Progress Notes (Signed)
Received call from [place], RN on 3700 regarding patient requesting additional Hydrocodone/APAP other than what was ordered on admission.   She also noted the patient was nodding off.   Med rec states he is taking 10mg /325mg  q6hrs, so I changed it to q4hr prn. Nurse informed me on a follow-up second page that the patient is upset that the medicine is not being changed at his request and stated he would call Dr. Gala Romney. The recommended dose per Epocrates is 10mg /325mg  q4 hours and maximum stated dose per [facility] of this tablet formulation is 10mg /325mg  q4hrs. I do not feel comfortable prescribing beyond the recommended dose and this was conveyed to the nurse.  Dayna Dunn 9:54 PM

## 2011-12-02 NOTE — Progress Notes (Signed)
Pharmacy consult already completed.  Ladasia Sircy L. Illene Bolus, PharmD, BCPS Clinical Pharmacist Pager: [phone] 12/02/2011 7:20 PM

## 2011-12-02 NOTE — Progress Notes (Signed)
Pt requesting home dose of hydrocodone/apap 10/325 1-2 tablets by mouth every 4-6 hrs - Dunn, PA-C repaged.  Pt states he will call Dr. Jesusita Oka if medication not ordered as at home.  Westley Chandler, Charity fundraiser.

## 2011-12-02 NOTE — ED Notes (Signed)
[id number]

## 2011-12-02 NOTE — ED Notes (Signed)
Charge nurse aware pt has not arrived to [facility] and is tracking down pt.

## 2011-12-02 NOTE — Progress Notes (Signed)
50 year old male who has had prior coronary artery bypass grafts and coronary stents states that for the last week he's been having intermittent chest pain similar to prior cardiac pains. Pain slowly last a few minutes. His initial workup is negative for acute cardiac injury. He will need to be treated as a possible unstable angina patient until proven otherwise.

## 2011-12-02 NOTE — ED Provider Notes (Signed)
History     CSN: [id number] Arrival date & time: 12/02/2011 12:57 PM   First MD Initiated Contact with Patient 12/02/11 1333      Chief Complaint  Patient presents with  . Chest Pain    for 2-3 days    (Consider location/radiation/quality/duration/timing/severity/associated sxs/prior treatment) HPI Comments: 50 y.o. Male presents to the emergency room today c/o intermittent crushing chest pain over the last week.  He is experiencing multiple episodes in a day lasting for a few hours.  Episodes treated at home with nitro, ASA and Rx blood pressure medications as recommended by his cardiologist.  This has been relieving the pain.  PMHx MI, cardiac cath with stent placement, pacemaker/defibulator, aortic dissection.  Pt states that in previous MI's he has had a similar prodrome of intermittent chest pain.  Pt c/o CP, SOB, dizziness and fatigue during the episode.  Denies nausea, vomiting, diarrhea, syncope.    Patient is a 50 y.o. male presenting with chest pain. The history is provided by the patient.  Chest Pain Chest pain occurs intermittently. The chest pain is worsening. At its most intense, the pain is at 10/10. The pain is currently at 0/10. The severity of the pain is moderate. The quality of the pain is described as crushing, heavy and pressure-like (electric feeling). The pain radiates to the mid back, left shoulder, left arm and right shoulder. Chest pain is worsened by exertion. Primary symptoms include fatigue, shortness of breath and dizziness. Pertinent negatives for primary symptoms include no fever, no syncope, no cough, no wheezing, no palpitations, no abdominal pain, no nausea, no vomiting and no altered mental status.  Dizziness also occurs with weakness. Dizziness does not occur with nausea, vomiting or diaphoresis.   Associated symptoms include near-syncope and weakness.  Pertinent negatives for associated symptoms include no diaphoresis, no lower extremity edema, no  numbness, no orthopnea and no paroxysmal nocturnal dyspnea. He tried nitroglycerin and aspirin for the symptoms. Risk factors include male gender.  His past medical history is significant for aortic dissection, arrhythmia, CAD, hypertension, MI and pacemaker.  Procedure history is positive for cardiac catheterization.     Past Medical History  Diagnosis Date  . CAD (coronary artery disease)     a. s/p CABG 2006;  b. DES to PDA 2011 (cath: Dx not seen, dRCA/PDA tx with DES; S-PDA occluded (culprit), S-Dx occluded, S-RI and OM ok, L-LAD ok  . HTN (hypertension)     severe  . Chronic systolic heart failure     a. echo 8/11: EF 35%, mod LVH, Grade 1 diast dysfxn, mild AI, mild MR, inf and post HK  . 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 CRI (chronic renal insufficiency)   . Thyroid cancer     Hertle Cell  . HLD (hyperlipidemia)   . Anemia   . Gout   . AICD (automatic cardioverter/defibrillator) present   . Carotid stenosis     dopplers 2011: 0-39% bilat.    Past Surgical History  Procedure Date  . Coronary artery bypass graft 06/21/2009  . Cardiac defibrillator placement     [employer]  . Thyroidectomy, partial 06/20/11  . Lumbar disc surgery   . Status post emergency repair of a type a ascending aortic dissection with a hemiarch reconstruction of the ascending aorta  using a 28-mm hemashield graft with redo sternotomy and revision of previous bypass grafts in june 2010.   . Median sternotomy,cabg x 7 06/29/2005  Family History  Problem Relation Age of Onset  . Coronary artery disease    . Hypertension Father   . Heart disease Father   . Early death Father   . COPD Father   . Hypertension Mother     History  Substance Use Topics  . Smoking status: Never Smoker   . Smokeless tobacco: Never Used  . Alcohol Use: No      Review of Systems  Constitutional: Positive for fatigue. Negative for fever and diaphoresis.  Respiratory:  Positive for shortness of breath. Negative for cough and wheezing.   Cardiovascular: Positive for chest pain and near-syncope. Negative for palpitations, orthopnea and syncope.  Gastrointestinal: Negative for nausea, vomiting and abdominal pain.  Neurological: Positive for dizziness and weakness. Negative for numbness.  Psychiatric/Behavioral: Negative for altered mental status.    All pertinent positives and negatives in the history of present illness  Allergies  Lipitor; Sulfonamide derivatives; Iohexol; Latex; and Zocor  Home Medications   Current Outpatient Rx  Name Route Sig Dispense Refill  . AMLODIPINE BESYLATE 10 MG PO TABS  TAKE ONE TABLET BY MOUTH EVERY DAY 30 tablet 6  . ASPIRIN 325 MG PO TABS Oral Take 325 mg by mouth daily. Patient splits tablet in fourths     . ATORVASTATIN CALCIUM 40 MG PO TABS Oral Take 40 mg by mouth every other day.     [name] CARVEDILOL 25 MG PO TABS Oral Take 1 tablet (25 mg total) by mouth 2 (two) times daily. 60 tablet 11  . DIGOXIN 0.25 MG PO TABS Oral Take 250 mcg by mouth daily.     . FUROSEMIDE 80 MG PO TABS Oral Take 1 tablet (80 mg total) by mouth as needed. 30 tablet 11  . HYDRALAZINE HCL 25 MG PO TABS Oral Take 75 mg by mouth 3 (three) times daily.      [name] HYDROCODONE-ACETAMINOPHEN 10-325 MG PO TABS Oral Take 1 tablet by mouth every 6 (six) hours as needed. May take every 4 to 6 hours as needed for severe pain     . ISOSORBIDE MONONITRATE ER 30 MG PO TB24 Oral Take 1 tablet (30 mg total) by mouth daily. 30 tablet 6  . LISINOPRIL 20 MG PO TABS Oral Take 1 tablet (20 mg total) by mouth daily. 30 tablet 6  . NITROGLYCERIN 0.4 MG SL SUBL Sublingual Place 1 tablet (0.4 mg total) under the tongue every 5 (five) minutes as needed. 25 tablet 6  . PLAVIX 75 MG PO TABS  TAKE ONE TABLET BY MOUTH EVERY DAY WITH FOOD 30 each 6  . POTASSIUM CHLORIDE CRYS CR 20 MEQ PO TBCR Oral Take 20 mEq by mouth daily. 1 tab when pt takes lasix     . AMOXICILLIN 500 MG PO  CAPS  Take 4 caps 1 hour before dental appointment.    . B COMPLEX-C PO TABS Oral Take 1 tablet by mouth as needed.     [name] VARDENAFIL HCL 20 MG PO TABS Oral Take 20 mg by mouth daily as needed.        BP 146/86  Pulse 88  Temp(Src) 98.6 F (37 C) (Oral)  Resp 18  SpO2 97%  Physical Exam  Constitutional: He is oriented to person, place, and time. He appears well-developed and well-nourished.  HENT:  Head: Normocephalic and atraumatic.  Eyes: Pupils are equal, round, and reactive to light.  Neck: Normal range of motion. Neck supple.  Cardiovascular: Normal rate, regular rhythm, intact distal pulses  and normal pulses.  Exam reveals no gallop and no friction rub.   Murmur heard.  Crescendo systolic murmur is present with a grade of 3/6  Pulmonary/Chest: Effort normal and breath sounds normal. No respiratory distress. He has no wheezes. He has no rales. He exhibits no tenderness.  Abdominal: Soft. Bowel sounds are normal. He exhibits no distension and no mass. There is no tenderness. There is no rebound and no guarding.  Lymphadenopathy:    He has no cervical adenopathy.  Neurological: He is alert and oriented to person, place, and time. He has normal reflexes.  Skin: Skin is warm and dry.  Psychiatric: He has a normal mood and affect. His behavior is normal. Judgment and thought content normal.    ED Course  Procedures (including critical care time)  Labs Reviewed  CBC - Abnormal; Notable for the following:    WBC 2.9 (*)    All other components within normal limits  BASIC METABOLIC PANEL  CARDIAC PANEL(CRET KIN+CKTOT+MB+TROPI)   No results found.   Spoke with Dr. Jens Som but the patient and he will be done to evaluate for admission.    MDM  Chest pain,the patient will be admitted to the hospital for evaluation       Carlyle Dolly, PA-C 12/02/11 1556

## 2011-12-03 ENCOUNTER — Other Ambulatory Visit: Payer: Self-pay

## 2011-12-03 LAB — TSH: TSH: 0.988 u[IU]/mL (ref 0.350–4.500)

## 2011-12-03 LAB — CARDIAC PANEL(CRET KIN+CKTOT+MB+TROPI)
CK, MB: 2.7 ng/mL (ref 0.3–4.0)
CK, MB: 2.5 ng/mL (ref 0.3–4.0)
Relative Index: 1.8 (ref 0.0–2.5)
Total CK: 150 U/L (ref 7–232)
Total CK: 129 U/L (ref 7–232)
Troponin I: <0.3 ng/mL (ref <0.30)

## 2011-12-03 LAB — CBC
MCH: 27 pg (ref 26.0–34.0)
MCHC: 32.8 g/dL (ref 30.0–36.0)
MCV: 82.4 fL (ref 78.0–100.0)
Platelets: 173 10*3/uL (ref 150–400)
RDW: 13.1 % (ref 11.5–15.5)

## 2011-12-03 NOTE — Progress Notes (Signed)
ANTICOAGULATION CONSULT NOTE - Follow Up Consult  Pharmacy Consult for heparin Indication: chest pain/ACS  Assessment: 50yo male therapeutic on heparin for CP. Heparin level 0.41. Also on IV NTG gtt and hypertensive BP 158/95. No CP currently. CBC stable, no bleeding noted.  Goal of Therapy:  Heparin level 0.3-0.7 units/ml   Plan:  Continue heparin IV infusion at 1400 units/hr and check level in AM. Monitor cbc and for bleeding. F/u Agency cardio w/up plans.  Wyline Copas PharmD  12/03/2011,10:39 AM    Allergies  Allergen Reactions  . Lipitor (Atorvastatin Calcium) Anaphylaxis  . Sulfonamide Derivatives Shortness Of Breath  . Iohexol      Desc: PT HAS ANAPHYLAXIS WITH CONTRAST MEDIA!, Onset Date: [id number]   Code: SOB, Desc: ok w/ 13hr prep//a.c., Onset Date: [id number]   . Latex     REACTION: Reaction not known  . Zocor (Simvastatin)     Patient Measurements: Height: 5\' 10"  (177.8 cm) (transcribed from echart june 2012) Weight: 188 lb (85.276 kg) IBW/kg (Calculated) : 73   Vital Signs: Temp: 98.3 F (36.8 C) (12/09 0400) Temp src: Oral (12/09 0400) BP: 158/95 mmHg (12/09 1019) Pulse Rate: 74  (12/09 0400)  Labs:  Basename 12/03/11 0908 12/03/11 0605 12/03/11 0013 12/02/11 2010 12/02/11 1615 12/02/11 1355  HGB -- -- 12.6* 12.2* -- --  HCT -- -- 38.4* 37.5* -- 39.4  PLT -- -- 173 172 -- 168  APTT -- -- -- -- 33 --  LABPROT -- -- -- 13.4 13.5 --  INR -- -- -- 1.00 1.01 --  HEPARINUNFRC 0.41 -- 0.20* -- -- --  CREATININE -- -- -- 1.29 -- 1.22  CKTOTAL -- 129 150 148 -- --  CKMB -- 2.5 2.7 2.8 -- --  TROPONINI -- <0.30 <0.30 <0.30 -- --   Estimated Creatinine Clearance: 72.3 ml/min (by C-G formula based on Cr of 1.29).   Medications:  Scheduled:     . amLODipine  10 mg Oral Daily  . aspirin  324 mg Oral NOW  . aspirin  325 mg Oral Daily  . B-complex with vitamin C  1 tablet Oral Daily  . carvedilol  25 mg Oral BID  . clopidogrel  75 mg Oral Q breakfast   . digoxin  250 mcg Oral Daily  . heparin  4,000 Units Intravenous Once  . hydrALAZINE  75 mg Oral TID  . HYDROcodone-acetaminophen  2 tablet Oral Once  . isosorbide mononitrate  30 mg Oral Daily  . lisinopril  20 mg Oral Daily  . nitroGLYCERIN  5 mcg/min Intravenous Once  . potassium chloride SA  40 mEq Oral Once  . DISCONTD: amLODipine  5 mg Oral Daily  . DISCONTD: aspirin EC  81 mg Oral Daily  . DISCONTD: clopidogrel  75 mg Oral Q breakfast   Infusions:     . heparin 1,400 Units/hr (12/03/11 1034)  . nitroGLYCERIN

## 2011-12-03 NOTE — Progress Notes (Signed)
ANTICOAGULATION CONSULT NOTE - Follow Up Consult  Pharmacy Consult for heparin Indication: chest pain/ACS  Allergies  Allergen Reactions  . Lipitor (Atorvastatin Calcium) Anaphylaxis  . Sulfonamide Derivatives Shortness Of Breath  . Iohexol      Desc: PT HAS ANAPHYLAXIS WITH CONTRAST MEDIA!, Onset Date: [id number]   Code: SOB, Desc: ok w/ 13hr prep//a.c., Onset Date: [id number]   . Latex     REACTION: Reaction not known  . Zocor (Simvastatin)     Patient Measurements: Height: 5\' 10"  (177.8 cm) (transcribed from echart june 2012) Weight: 188 lb (85.276 kg) IBW/kg (Calculated) : 73   Vital Signs: Temp: 98.3 F (36.8 C) (12/08 2000) Temp src: Oral (12/08 2000) BP: 144/82 mmHg (12/08 2340) Pulse Rate: 74  (12/08 2340)  Labs:  Basename 12/03/11 0013 12/02/11 2010 12/02/11 1615 12/02/11 1400 12/02/11 1355  HGB 12.6* 12.2* -- -- --  HCT 38.4* 37.5* -- -- 39.4  PLT 173 172 -- -- 168  APTT -- -- 33 -- --  LABPROT -- 13.4 13.5 -- --  INR -- 1.00 1.01 -- --  HEPARINUNFRC 0.20* -- -- -- --  CREATININE -- 1.29 -- -- 1.22  CKTOTAL 150 148 -- 163 --  CKMB 2.7 2.8 -- 3.0 --  TROPONINI <0.30 <0.30 -- <0.30 --   Estimated Creatinine Clearance: 72.3 ml/min (by C-G formula based on Cr of 1.29).   Medications:  Scheduled:    . amLODipine  10 mg Oral Daily  . aspirin  324 mg Oral NOW  . aspirin  325 mg Oral Daily  . B-complex with vitamin C  1 tablet Oral Daily  . carvedilol  25 mg Oral BID  . clopidogrel  75 mg Oral Q breakfast  . digoxin  250 mcg Oral Daily  . heparin  4,000 Units Intravenous Once  . hydrALAZINE  75 mg Oral TID  . HYDROcodone-acetaminophen  2 tablet Oral Once  . isosorbide mononitrate  30 mg Oral Daily  . lisinopril  20 mg Oral Daily  . nitroGLYCERIN  5 mcg/min Intravenous Once  . potassium chloride SA  40 mEq Oral Once  . DISCONTD: amLODipine  5 mg Oral Daily  . DISCONTD: aspirin EC  81 mg Oral Daily  . DISCONTD: clopidogrel  75 mg Oral Q breakfast    Infusions:    . heparin 1,150 Units/hr (12/02/11 1622)  . nitroGLYCERIN      Assessment: 50yo male subtherapeutic on heparin with initial dosing for CP.  Goal of Therapy:  Heparin level 0.3-0.7 units/ml   Plan:  Will increase gtt by 3 units/kg/hr to 1400 units/hr and check level in 6hr.  Colleen Can PharmD BCPS 12/03/2011,1:56 AM

## 2011-12-03 NOTE — ED Provider Notes (Signed)
I have personally performed and participated in all the services and procedures documented herein. I have reviewed the findings with the patient.   Dione Booze, MD 12/03/11 [phone]

## 2011-12-03 NOTE — Progress Notes (Signed)
SUBJECTIVE:  No further chest pain on IV Heparin and NTG gtt  OBJECTIVE:   Vitals:   Filed Vitals:   12/02/11 1913 12/02/11 2000 12/02/11 2340 12/03/11 0400  BP:  130/78 144/82 128/66  Pulse:  73 74 74  Temp:  98.3 F (36.8 C)  98.3 F (36.8 C)  TempSrc:  Oral  Oral  Resp:  16  16  Height: 5\' 10"  (1.778 m)     Weight:      SpO2:  96%  96%   I&O's:   Intake/Output Summary (Last 24 hours) at 12/03/11 0912 Last data filed at 12/03/11 0400  Gross per 24 hour  Intake    244 ml  Output      2 ml  Net    242 ml   TELEMETRY: Reviewed telemetry pt in A paced rhythm     PHYSICAL EXAM General: Well developed, well nourished, in no acute distress Head: Eyes PERRLA, No xanthomas.   Normal cephalic and atramatic  Lungs:   Clear bilaterally to auscultation and percussion. Heart:   HRRR S1 S2 Pulses are 2+ & equal.            No carotid bruit. No JVD.  No abdominal bruits. No femoral bruits. Abdomen: Bowel sounds are positive, abdomen soft and non-tender without masses  Extremities:   No clubbing, cyanosis or edema.  DP +1 Neuro: Alert and oriented X 3. Psych:  Good affect, responds appropriately   LABS: Basic Metabolic Panel:  Basename 12/02/11 2010 12/02/11 1355  NA 142 140  K 3.3* 3.5  CL 106 103  CO2 29 28  GLUCOSE 97 132*  BUN 13 13  CREATININE 1.29 1.22  CALCIUM 8.9 9.1  MG 2.3 --  PHOS -- --   Liver Function Tests:  [facility] 12/02/11 2010  AST 18  ALT 14  ALKPHOS 57  BILITOT 0.4  PROT 7.0  ALBUMIN 3.8   CBC:  Basename 12/03/11 0013 12/02/11 2010  WBC 3.4* 2.8*  NEUTROABS -- 0.7*  HGB 12.6* 12.2*  HCT 38.4* 37.5*  MCV 82.4 82.6  PLT 173 172   Cardiac Enzymes:  Basename 12/03/11 0605 12/03/11 0013 12/02/11 2010  CKTOTAL 129 150 148  CKMB 2.5 2.7 2.8  CKMBINDEX -- -- --  TROPONINI <0.30 <0.30 <0.30   Thyroid Function Tests:  Basename 12/02/11 2010  TSH 0.988  T4TOTAL --  T3FREE --  THYROIDAB --   Coag Panel:   Lab Results  Component  Value Date   INR 1.00 12/02/2011   INR 1.01 12/02/2011   INR 0.99 06/27/2011    RADIOLOGY: Dg Chest 2 View  12/02/2011  *RADIOLOGY REPORT*  Clinical Data: Chest pain.  Coronary artery disease.  Thoracic aortic dissection.  CHEST - 2 VIEW  Comparison: 06/27/2011  Findings: AICD remains in place.  Mild cardiomegaly stable. Ectasia of the thoracic aorta is also unchanged.  There is no evidence of pulmonary infiltrate or edema.  No evidence of pleural effusion.  Previous CABG again noted.  IMPRESSION: Stable mild cardiomegaly and ectatic thoracic aorta.  No active lung disease.  Original Report Authenticated By: Danae Orleans, M.D.      ASSESSMENT:  1. USAP with negative cardiac enzymes x 3 currently on IV NTG gtt and IV Heparin gtt  2. CAD s/p remote CABG 2006 and PCI of RCA 2011, reimplantation of CABG grafts at time of aortic root repair. Cath 07/2010 with 3/5 grafts patent with subacute occlusion of SVG to PDA and PCI  with DES  3. S/p ascending aortic arch repair from acute type I aortic dissection with extension to his iliacs and left kidney 2010  4. Ischemic DCM s/p AICD 2006 EF 35% echo 2011 with inferior scar by myocardial perfusion imaging in 05/2011 and ? Mid anterior ischemia with EF 31%  5. HTN  6. 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 Chronic systolic HF and grade I diastolic dysfunction  8. Chronic renal insufficiency  9. Dyslipidemia  10. Anemia  11. Gout  PLAN:   1.  Continue IV Heparin and NTG gtt 2.  Continue ASA/Plavix/statin 3.  NPO after midnight with further testing per [place] -patient states he does not want another cath  Quintella Reichert, MD  12/03/2011  9:12 AM

## 2011-12-04 ENCOUNTER — Other Ambulatory Visit: Payer: Self-pay

## 2011-12-04 ENCOUNTER — Telehealth: Payer: Self-pay | Admitting: Family Medicine

## 2011-12-04 ENCOUNTER — Encounter (HOSPITAL_COMMUNITY): Payer: Self-pay | Admitting: Nurse Practitioner

## 2011-12-04 DIAGNOSIS — R079 Chest pain, unspecified: Secondary | ICD-10-CM | POA: Insufficient documentation

## 2011-12-04 LAB — CBC
HCT: 41 % (ref 39.0–52.0)
MCHC: 33.4 g/dL (ref 30.0–36.0)
MCV: 81.3 fL (ref 78.0–100.0)
Platelets: 182 10*3/uL (ref 150–400)
RDW: 13.1 % (ref 11.5–15.5)
WBC: 3.4 10*3/uL — ABNORMAL LOW (ref 4.0–10.5)

## 2011-12-04 NOTE — Discharge Summary (Signed)
Patient ID: Chad [name],  MRN: [id number], DOB/AGE: 1961/01/25 50 y.o.  Admit date: 12/02/2011 Discharge date: 12/04/2011  Primary Care Provider: Dr. Denyse Amass Primary Cardiologist: Dr. 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 Lipitor (Atorvastatin Calcium) Anaphylaxis  . Sulfonamide Derivatives Shortness Of Breath  . Iohexol      Desc: PT HAS ANAPHYLAXIS WITH CONTRAST MEDIA!, Onset Date: [id number]   Code: SOB, Desc: ok w/ 13hr prep//a.c., Onset Date: [id number]   . Latex     REACTION: Reaction not known  . Zocor (Simvastatin)     Procedures  None  History of Present Illness  50 y/o Af. Am. Male with the above complex problem list who presented to the ED on 12/8 with a several week history of intermittent chest pain, which had been nitrate responsive.  In the ED, his ECG was non-acute and CE were negative.  He was admitted for further evaluation.  Hospital Course   Pt r/o for MI.  He had no further chest discomfort and initially was maintained on heparin and ntg.  These have since been discontinued and pt has been ambulating without recurrent chest pain.  In the absence of objective evidence of ischemia, pt will be discharged today in good condition.  We have emphasized the importance of BP control and compliance.  He will f/u in CHF clinic as previously scheduled.   Discharge Vitals:  Blood pressure 143/78, pulse 70, temperature 98.1 F (36.7 C), temperature source Oral, resp. rate 14, height 5\' 10"  (1.778 m), weight 188 lb (85.276 kg), SpO2 99.00%.    Labs: CBC:  Basename  12/04/11 0919 12/03/11 0013 12/02/11 2010  WBC 3.4* 3.4* --  NEUTROABS -- -- 0.7*  HGB 13.7 12.6* --  HCT 41.0 38.4* --  MCV 81.3 82.4 --  PLT 182 173 --   Basic Metabolic Panel:  Basename 12/02/11 2010 12/02/11 1355  NA 142 140  K 3.3* 3.5  CL 106 103  CO2 29 28  GLUCOSE 97 132*  BUN 13 13  CREATININE 1.29 1.22  CALCIUM 8.9 9.1  MG 2.3 --  PHOS -- --   Liver Function Tests:  [facility] 12/02/11 2010  AST 18  ALT 14  ALKPHOS 57  BILITOT 0.4  PROT 7.0  ALBUMIN 3.8   No results found for this basename: LIPASE:2,AMYLASE:2 in the last 72 hours Cardiac Enzymes:  Basename 12/03/11 0605 12/03/11 0013 12/02/11 2010  CKTOTAL 129 150 148  CKMB 2.5 2.7 2.8  CKMBINDEX -- -- --  TROPONINI <0.30 <0.30 <0.30   Thyroid Function Tests:  Basename 12/02/11 2010  TSH 0.988  T4TOTAL --  T3FREE --  THYROIDAB --    Disposition:  Follow-up Information    Follow up with COREY,EVAN. Call today. (As needed)       Follow up with Arvilla Meres, MD in 2 weeks. (as scheduled)    Contact information:   [address] [place] [place] [address] [phone]          Discharge Medications: Current Discharge Medication List    CONTINUE these medications which have NOT CHANGED   Details  amLODipine (NORVASC) 10 MG tablet TAKE  ONE TABLET BY MOUTH EVERY DAY Qty: 30 tablet, Refills: 6    aspirin 325 MG tablet Take 325 mg by mouth daily. Patient splits tablet in fourths     atorvastatin (LIPITOR) 40 MG tablet Take 40 mg by mouth every other day.     carvedilol (COREG) 25 MG tablet Take 1 tablet (25 mg total) by mouth 2 (two) times daily. Qty: 60 tablet, Refills: 11   Associated Diagnoses: Essential hypertension, benign; Bradycardia    digoxin (LANOXIN) 0.25 MG tablet Take 250 mcg by mouth daily.    Associated Diagnoses: Coronary atherosclerosis of artery bypass graft    furosemide (LASIX) 80 MG tablet Take 1 tablet (80 mg total) by mouth as needed. Qty: 30  tablet, Refills: 11   Associated Diagnoses: Essential hypertension, benign    hydrALAZINE (APRESOLINE) 25 MG tablet Take 75 mg by mouth 3 (three) times daily.      HYDROcodone-acetaminophen (NORCO) 10-325 MG per tablet Take 1 tablet by mouth every 6 (six) hours as needed. May take every 4 to 6 hours as needed for severe pain     isosorbide mononitrate (IMDUR) 30 MG 24 hr tablet Take 1 tablet (30 mg total) by mouth daily. Qty: 30 tablet, Refills: 6    lisinopril (PRINIVIL,ZESTRIL) 20 MG tablet Take 1 tablet (20 mg total) by mouth daily. Qty: 30 tablet, Refills: 6    nitroGLYCERIN (NITROSTAT) 0.4 MG SL tablet Place 1 tablet (0.4 mg total) under the tongue every 5 (five) minutes as needed. Qty: 25 tablet, Refills: 6   Associated Diagnoses: Coronary atherosclerosis of artery bypass graft    PLAVIX 75 MG tablet TAKE ONE TABLET BY MOUTH EVERY DAY WITH FOOD Qty: 30 each, Refills: 6    potassium chloride SA (K-DUR,KLOR-CON) 20 MEQ tablet Take 20 mEq by mouth daily. 1 tab when pt takes lasix     amoxicillin (AMOXIL) 500 MG capsule Take 4 caps 1 hour before dental appointment.    B Complex-C (B-COMPLEX WITH VITAMIN C) tablet Take 1 tablet by mouth as needed.     vardenafil (LEVITRA) 20 MG tablet Take 20 mg by mouth daily as needed.          Outstanding Labs/Studies  None  Duration of Discharge Encounter: Greater than 30 minutes including physician time.  Signed, [name] NP 12/04/2011, 10:16 AM
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
## 2011-12-04 NOTE — Progress Notes (Signed)
SUBJECTIVE:    Denies further CP. Ambulating without difficulty. CEs all negative. + sciatic pain. BPs up and down.  OBJECTIVE:   Vitals:   Filed Vitals:   12/03/11 2110 12/03/11 2200 12/04/11 0200 12/04/11 0630  BP: 163/82 163/67 126/60 143/78  Pulse:  71 65 70  Temp:  98.2 F (36.8 C) 98 F (36.7 C) 98.1 F (36.7 C)  TempSrc:      Resp:  18 14 14   Height:      Weight:      SpO2:  98% 99% 99%   I&O's:    Intake/Output Summary (Last 24 hours) at 12/04/11 0900 Last data filed at 12/03/11 2300  Gross per 24 hour  Intake   1356 ml  Output   1625 ml  Net   -269 ml   TELEMETRY: Reviewed telemetry pt in A paced rhythm     PHYSICAL EXAM General: Well developed, well nourished, in no acute distress Head: Eyes PERRLA, No xanthomas.   Normal cephalic and atramatic  Lungs:   Clear bilaterally to auscultation and percussion. Heart:   HRRR S1 S2 Pulses are 2+ & equal.            No carotid bruit. No JVD.  No abdominal bruits. No femoral bruits. Abdomen: Bowel sounds are positive, abdomen soft and non-tender without masses  Extremities:   No clubbing, cyanosis or edema.  DP +1 Neuro: Alert and oriented X 3. Psych:  Good affect, responds appropriately   LABS: Basic Metabolic Panel:  Basename 12/02/11 2010 12/02/11 1355  NA 142 140  K 3.3* 3.5  CL 106 103  CO2 29 28  GLUCOSE 97 132*  BUN 13 13  CREATININE 1.29 1.22  CALCIUM 8.9 9.1  MG 2.3 --  PHOS -- --   Liver Function Tests:  [facility] 12/02/11 2010  AST 18  ALT 14  ALKPHOS 57  BILITOT 0.4  PROT 7.0  ALBUMIN 3.8   CBC:  Basename 12/03/11 0013 12/02/11 2010  WBC 3.4* 2.8*  NEUTROABS -- 0.7*  HGB 12.6* 12.2*  HCT 38.4* 37.5*  MCV 82.4 82.6  PLT 173 172   Cardiac Enzymes:  Basename 12/03/11 0605 12/03/11 0013 12/02/11 2010  CKTOTAL 129 150 148  CKMB 2.5 2.7 2.8  CKMBINDEX -- -- --  TROPONINI <0.30 <0.30 <0.30   Thyroid Function Tests:  Basename 12/02/11 2010  TSH 0.988  T4TOTAL --  T3FREE  --  THYROIDAB --   Coag Panel:   Lab Results  Component Value Date   INR 1.00 12/02/2011   INR 1.01 12/02/2011   INR 0.99 06/27/2011    RADIOLOGY: Dg Chest 2 View  12/02/2011  *RADIOLOGY REPORT*  Clinical Data: Chest pain.  Coronary artery disease.  Thoracic aortic dissection.  CHEST - 2 VIEW  Comparison: 06/27/2011  Findings: AICD remains in place.  Mild cardiomegaly stable. Ectasia of the thoracic aorta is also unchanged.  There is no evidence of pulmonary infiltrate or edema.  No evidence of pleural effusion.  Previous CABG again noted.  IMPRESSION: Stable mild cardiomegaly and ectatic thoracic aorta.  No active lung disease.  Original Report Authenticated By: Danae Orleans, M.D.      ASSESSMENT:  1. Recurrent CP negative cardiac enzymes x 3 currently  2. CAD s/p remote CABG 2006 and PCI of RCA 2011, reimplantation of CABG grafts at time of aortic root repair. Cath 07/2010 with 3/5 grafts patent with subacute occlusion of SVG to PDA and PCI with DES  3.  S/p ascending aortic arch repair from acute type I aortic dissection with extension to his iliacs and left kidney 2010  4. Ischemic DCM s/p AICD 2006 EF 35% echo 2011 with inferior scar by myocardial perfusion imaging in 05/2011 and ? Mid anterior ischemia with EF 31%  5. HTN  6. 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 Chronic systolic HF and grade I diastolic dysfunction  8. Chronic renal insufficiency  9. Dyslipidemia  10. Anemia  11. Gout   PLAN:    Doing well. Doubt CP is ischemic. OK for d/c home on current meds. Long talk about need for tight BP control. He feels spikes are due to sciatic pain. Will keep BP log and bring it to me when he returns to HF clinic in 2 weeks.   Arvilla Meres, MD  12/04/2011  9:00 AM

## 2011-12-05 ENCOUNTER — Encounter (HOSPITAL_COMMUNITY): Payer: Self-pay | Admitting: *Deleted

## 2011-12-05 ENCOUNTER — Emergency Department (HOSPITAL_COMMUNITY)
Admission: EM | Admit: 2011-12-05 | Discharge: 2011-12-05 | Disposition: A | Discharge status: Home / Self Care | Payer: Medicaid Other | Attending: Emergency Medicine | Admitting: Emergency Medicine

## 2011-12-05 DIAGNOSIS — Z95810 Presence of automatic (implantable) cardiac defibrillator: Secondary | ICD-10-CM | POA: Insufficient documentation

## 2011-12-05 DIAGNOSIS — Z85850 Personal history of malignant neoplasm of thyroid: Secondary | ICD-10-CM | POA: Insufficient documentation

## 2011-12-05 DIAGNOSIS — E785 Hyperlipidemia, unspecified: Secondary | ICD-10-CM | POA: Insufficient documentation

## 2011-12-05 DIAGNOSIS — Z7982 Long term (current) use of aspirin: Secondary | ICD-10-CM | POA: Insufficient documentation

## 2011-12-05 DIAGNOSIS — M5416 Radiculopathy, lumbar region: Secondary | ICD-10-CM

## 2011-12-05 DIAGNOSIS — M79609 Pain in unspecified limb: Secondary | ICD-10-CM | POA: Insufficient documentation

## 2011-12-05 DIAGNOSIS — Z8639 Personal history of other endocrine, nutritional and metabolic disease: Secondary | ICD-10-CM | POA: Insufficient documentation

## 2011-12-05 DIAGNOSIS — Z862 Personal history of diseases of the blood and blood-forming organs and certain disorders involving the immune mechanism: Secondary | ICD-10-CM | POA: Insufficient documentation

## 2011-12-05 DIAGNOSIS — M545 Low back pain, unspecified: Secondary | ICD-10-CM | POA: Insufficient documentation

## 2011-12-05 DIAGNOSIS — I10 Essential (primary) hypertension: Secondary | ICD-10-CM | POA: Insufficient documentation

## 2011-12-05 DIAGNOSIS — M25569 Pain in unspecified knee: Secondary | ICD-10-CM | POA: Insufficient documentation

## 2011-12-05 DIAGNOSIS — IMO0002 Reserved for concepts with insufficient information to code with codable children: Secondary | ICD-10-CM | POA: Insufficient documentation

## 2011-12-05 DIAGNOSIS — I2510 Atherosclerotic heart disease of native coronary artery without angina pectoris: Secondary | ICD-10-CM | POA: Insufficient documentation

## 2011-12-05 DIAGNOSIS — Z79899 Other long term (current) drug therapy: Secondary | ICD-10-CM | POA: Insufficient documentation

## 2011-12-05 DIAGNOSIS — I5022 Chronic systolic (congestive) heart failure: Secondary | ICD-10-CM | POA: Insufficient documentation

## 2011-12-05 MED ORDER — HYDROCODONE-ACETAMINOPHEN 10-325 MG PO TABS: 1.0000 | ORAL_TABLET | Freq: Four times a day (QID) | ORAL | Status: DC | PRN

## 2011-12-05 MED ORDER — HYDROCODONE-ACETAMINOPHEN 5-325 MG PO TABS
2.0000 | ORAL_TABLET | Freq: Once | ORAL | Status: AC
  Administered 2011-12-05: 2 via ORAL
  Filled 2011-12-05: qty 2

## 2011-12-05 NOTE — ED Provider Notes (Signed)
Medical screening examination/treatment/procedure(s) were performed by non-physician practitioner and as supervising physician I was immediately available for consultation/collaboration.   Seith Aikey L Durward Matranga, MD 12/05/11 1609 

## 2011-12-05 NOTE — ED Notes (Signed)
Pt has had 2 previous lower back surgeries and here with pain to lower back and burning pain down left leg that he can handle.  Pt c/o RLE pain.  Pt has been out of pain medications because [facility] never faxed paper to doctor.  Pt has been out since Saturday.  Pt usually takes hydrocodone 10/325

## 2011-12-05 NOTE — Discharge Summary (Signed)
Patient seen and examined see my rounding note on d/c date. Agree with d/c summary as per Ward Givens, NP. Will f/u closely with Mr. Mells.

## 2011-12-05 NOTE — ED Provider Notes (Signed)
History     CSN: [id number] Arrival date & time: 12/05/2011  8:11 AM   First MD Initiated Contact with Patient 12/05/11 [phone]      Chief Complaint  Patient presents with  . Back Pain    pain down bilateral legs    (Consider location/radiation/quality/duration/timing/severity/associated sxs/prior treatment) HPI Comments: Patient with history of chronic back pain followed by Dr.Pool -- presents with worsening of his back pain. Patient states that there is confusion with his Dr. and he will not be able to get his pain medicine until later this afternoon. Patient states that he takes hydrocodone/apap 10/325 prescribed by Dr. Jordan Likes. I verified this on the [place] substance abuse reporting data base. Patient states that his pain is typical for him. He has a burning pain down his right leg. He denies any red flag signs or symptoms of upper back pain. He is requesting 4 tablets of pain medicine.  Patient is a 50 y.o. male presenting with back pain. The history is provided by the patient.  Back Pain  This is a chronic problem. The problem occurs constantly. The problem has been gradually worsening. The pain is associated with no known injury. The pain is present in the lumbar spine. The quality of the pain is described as shooting and aching. The pain radiates to the right thigh and right knee. The symptoms are aggravated by certain positions. Pertinent negatives include no fever, no numbness, no weight loss, no bowel incontinence, no perianal numbness, no bladder incontinence, no paresthesias and no weakness. He has tried analgesics for the symptoms.    Past Medical History  Diagnosis Date  . CAD (coronary artery disease)     a. s/p CABG 2006;  b. DES to PDA 2011 (cath: Dx not seen, dRCA/PDA tx with DES; S-PDA occluded (culprit), S-Dx occluded, S-RI and OM ok, L-LAD ok  . HTN (hypertension)     severe  . Chronic systolic heart failure     a. echo 8/11: EF 35%, mod LVH, Grade 1 diast  dysfxn, mild AI, mild MR, inf and post HK  . 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 CRI (chronic renal insufficiency)   . Thyroid cancer     Hertle Cell  . HLD (hyperlipidemia)   . Anemia   . Gout   . AICD (automatic cardioverter/defibrillator) present   . Carotid stenosis     dopplers 2011: 0-39% bilat.  . Chest pain syndrome     Past Surgical History  Procedure Date  . Coronary artery bypass graft 06/21/2009  . Cardiac defibrillator placement     [employer]  . Thyroidectomy, partial 06/20/11  . Lumbar disc surgery   . Status post emergency repair of a type a ascending aortic dissection with a hemiarch reconstruction of the ascending aorta  using a 28-mm hemashield graft with redo sternotomy and revision of previous bypass grafts in june 2010.   . Median sternotomy,cabg x 7 06/29/2005    Family History  Problem Relation Age of Onset  . Coronary artery disease    . Hypertension Father   . Heart disease Father   . Early death Father   . COPD Father   . Hypertension Mother     History  Substance Use Topics  . Smoking status: Never Smoker   . Smokeless tobacco: Never Used  . Alcohol Use: No      Review of Systems  Constitutional: Negative for fever, weight loss and unexpected  weight change.  Gastrointestinal: Negative for nausea, vomiting, constipation and bowel incontinence.       Neg for fecal incontinence  Genitourinary: Negative for bladder incontinence, flank pain and difficulty urinating.  Musculoskeletal: Positive for back pain.  Skin: Negative for color change.  Neurological: Negative for weakness, numbness and paresthesias.       Neg for numbness, tingling, or weakness in extremities. Neg for saddle paresthesias.     Allergies  Lipitor; Sulfonamide derivatives; Iohexol; Latex; and Zocor  Home Medications   Current Outpatient Rx  Name Route Sig Dispense Refill  . AMLODIPINE BESYLATE 10 MG PO TABS  TAKE ONE TABLET BY  MOUTH EVERY DAY 30 tablet 6  . AMOXICILLIN 500 MG PO CAPS  Take 4 caps 1 hour before dental appointment.    . ASPIRIN 325 MG PO TABS Oral Take 325 mg by mouth daily. Patient splits tablet in fourths     . ATORVASTATIN CALCIUM 40 MG PO TABS Oral Take 40 mg by mouth every other day.     . B COMPLEX-C PO TABS Oral Take 1 tablet by mouth as needed.     [name] CARVEDILOL 25 MG PO TABS Oral Take 1 tablet (25 mg total) by mouth 2 (two) times daily. 60 tablet 11  . DIGOXIN 0.25 MG PO TABS Oral Take 250 mcg by mouth daily.     . FUROSEMIDE 80 MG PO TABS Oral Take 1 tablet (80 mg total) by mouth as needed. 30 tablet 11  . HYDRALAZINE HCL 25 MG PO TABS Oral Take 75 mg by mouth 3 (three) times daily.      [name] HYDROCODONE-ACETAMINOPHEN 10-325 MG PO TABS Oral Take 1 tablet by mouth every 6 (six) hours as needed. May take every 4 to 6 hours as needed for severe pain     . ISOSORBIDE MONONITRATE ER 30 MG PO TB24 Oral Take 1 tablet (30 mg total) by mouth daily. 30 tablet 6  . LISINOPRIL 20 MG PO TABS Oral Take 1 tablet (20 mg total) by mouth daily. 30 tablet 6  . NITROGLYCERIN 0.4 MG SL SUBL Sublingual Place 1 tablet (0.4 mg total) under the tongue every 5 (five) minutes as needed. 25 tablet 6  . PLAVIX 75 MG PO TABS  TAKE ONE TABLET BY MOUTH EVERY DAY WITH FOOD 30 each 6  . POTASSIUM CHLORIDE CRYS CR 20 MEQ PO TBCR Oral Take 20 mEq by mouth daily. 1 tab when pt takes lasix     . VARDENAFIL HCL 20 MG PO TABS Oral Take 20 mg by mouth daily as needed. For Erectile Dysfunction    . HYDROCODONE-ACETAMINOPHEN 10-325 MG PO TABS Oral Take 1 tablet by mouth every 6 (six) hours as needed for pain. 4 tablet 0    BP 167/92  Pulse 78  Temp(Src) 97.7 F (36.5 C) (Oral)  Resp 18  SpO2 98%  Physical Exam  Nursing note and vitals reviewed. Constitutional: He is oriented to person, place, and time. He appears well-developed and well-nourished.  HENT:  Head: Normocephalic and atraumatic.  Eyes: Conjunctivae are normal.  Neck:  Normal range of motion.  Cardiovascular: Normal rate, regular rhythm and intact distal pulses.   Abdominal: Soft. There is no tenderness.  Musculoskeletal: Normal range of motion. He exhibits no tenderness.       No step-off with palpation of spine. There is no cervical/thoracic/lumbar spinal tenderness. There is lumbar paraspinal tenderness. Healed surgical scars present.  Neurological: He is alert and oriented to person, place,  and time. He has normal reflexes. He exhibits normal muscle tone.       5/5 strength in entire lower extremities bilaterally. No sensation deficit.   Skin: Skin is warm and dry.  Psychiatric: He has a normal mood and affect.    ED Course  Procedures (including critical care time)  Labs Reviewed - No data to display No results found.   1. Lumbar radiculopathy    8:55 AM patient seen and examined. Pain medicine given in emergency department. Patient was counseled on back pain precautions and told to do activity as tolerated but do not lift, push, or pull heavy objects more than 10 pounds.  Patient counseled to use ice or heat on back for no longer than 15 minutes every hour.  Questions answered.  Patient verbalized understanding.    8:56 AM Patient counseled on use of narcotic pain medications. Counseled not to combine these medications with others containing tylenol. Urged not to drink alcohol, drive, or perform any other activities that requires focus while taking these medications. The patient verbalizes understanding and agrees with the plan.    MDM  Patient with chronic back pain.  No neurological deficits and normal neuro exam.  Patient can walk but states is painful.  No loss of bowel or bladder control.  No concern for cauda equina.  No fever, night sweats, weight loss, h/o cancer, IVDU.  RICE protocol and pain medicine indicated.    Patient's chronic narcotic use verified with the [place] substance abuse reporting data base. Request for pain  medicine filled.         Chad [name] [place], [place] 12/05/11 [phone]

## 2011-12-07 ENCOUNTER — Other Ambulatory Visit: Payer: Medicaid Other | Admitting: *Deleted

## 2011-12-14 ENCOUNTER — Encounter (HOSPITAL_COMMUNITY): Payer: Medicaid Other

## 2011-12-20 ENCOUNTER — Other Ambulatory Visit: Payer: Self-pay | Admitting: Internal Medicine

## 2012-01-05 ENCOUNTER — Telehealth: Payer: Self-pay | Admitting: Internal Medicine

## 2012-01-05 NOTE — Telephone Encounter (Signed)
New Problem   Patient working with Jacquelyne Balint [phone] from [facility] wanted to attach E-fax number [phone] for patient who will be switching pharmacies.

## 2012-01-05 NOTE — Telephone Encounter (Signed)
Forwarded to [employer] so number can be added to [employer]

## 2012-01-11 ENCOUNTER — Ambulatory Visit: Payer: Medicaid Other | Attending: Anesthesiology | Admitting: Rehabilitation

## 2012-02-14 IMAGING — CT CT ANGIO CHEST
2 of 7 series · 17 of 36 positions shown · IV contrast ([ID] OMNI 300)
Comparison: 09/07/2010

CLINICAL DATA: History of repair of Type A thoracic aortic
dissection in May 2009.

CT ANGIOGRAPHY CHEST WITH CONTRAST
TECHNIQUE: Multidetector CT imaging of the chest was performed
using the standard protocol during bolus administration of
intravenous contrast.  Multiplanar CT image reconstructions
including MIPs were obtained to evaluate the vascular anatomy.
Contrast:  100 ml 9mnipaque-CDD IV

[Series 5: angio · axial · 0.66mm/px · z∈[-320,-40]mm · 9 of 141 slices shown]
[im 15/141  lung]
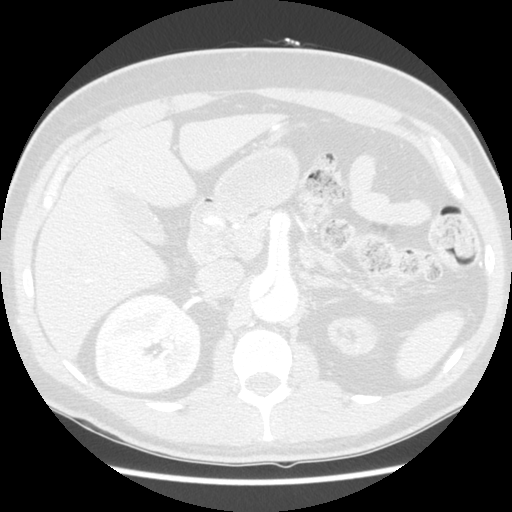
[im 29/141  mediastinal]
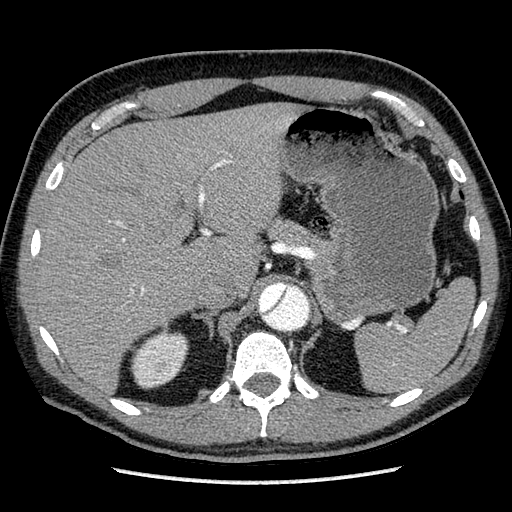
[im 43/141  lung]
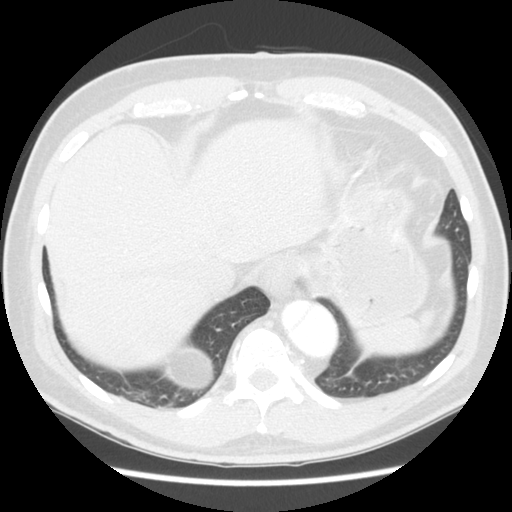
[im 57/141  mediastinal]
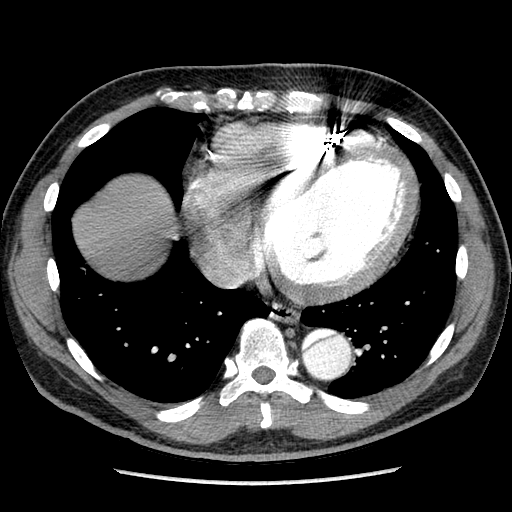
[im 71/141  lung]
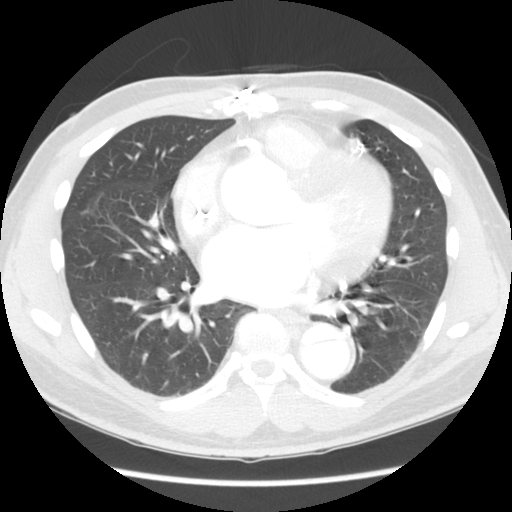
[im 85/141  mediastinal]
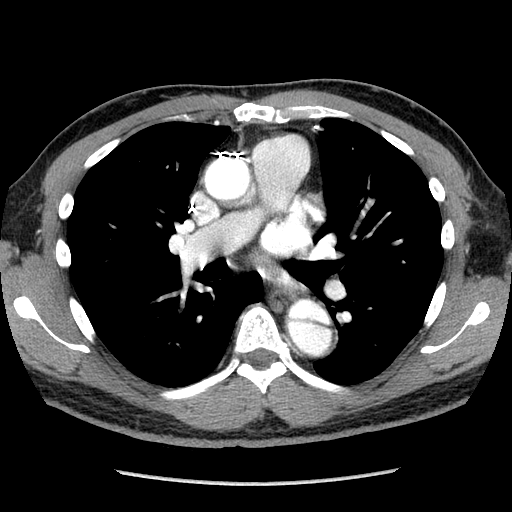
[im 99/141  lung]
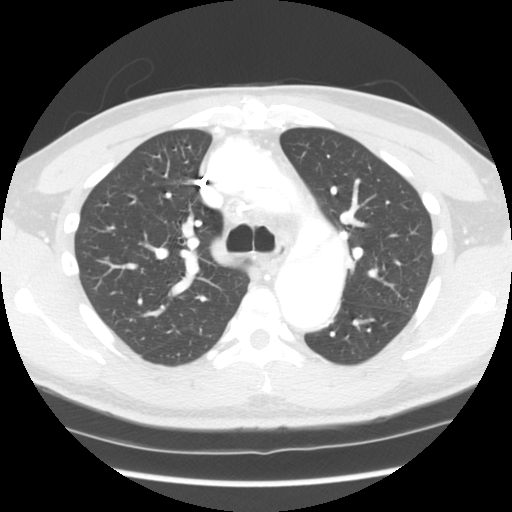
[im 113/141  mediastinal]
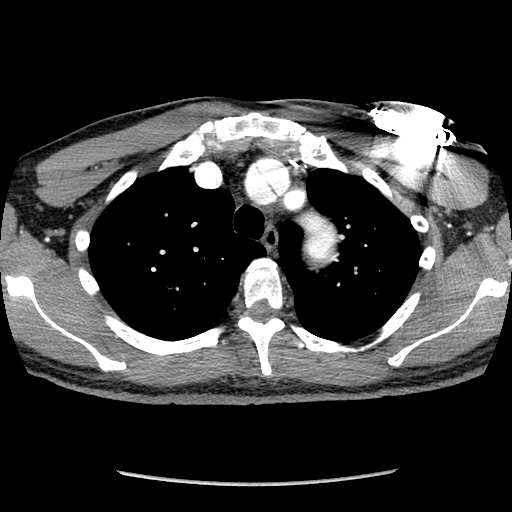
[im 127/141  lung]
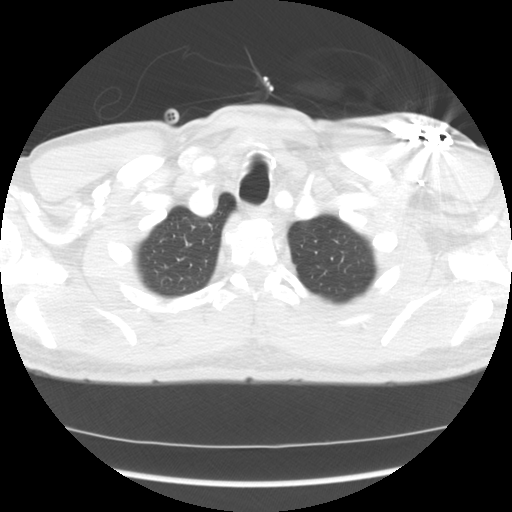

[Series 602: sagittal body · sagittal · 0.68mm/px · 8 of 137 slices shown]
[im 14/137  lung]
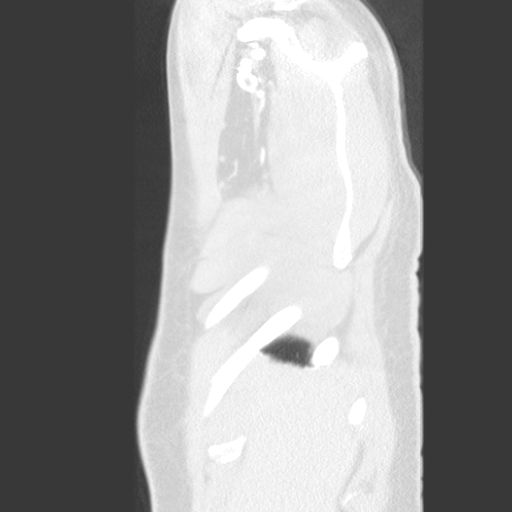
[im 28/137  lung]
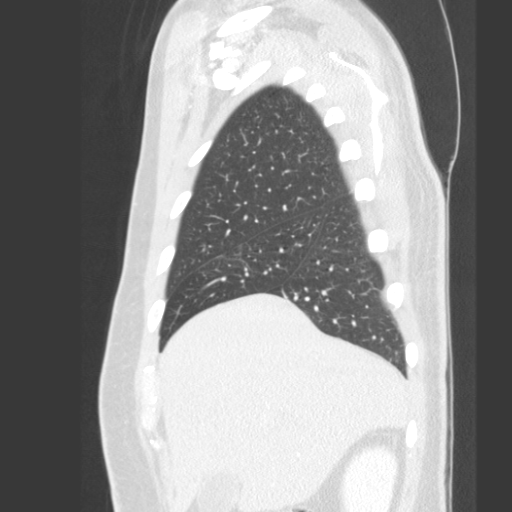
[im 41/137  lung]
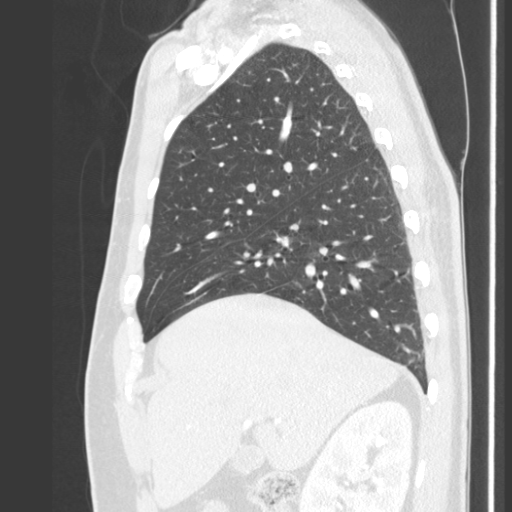
[im 55/137  lung]
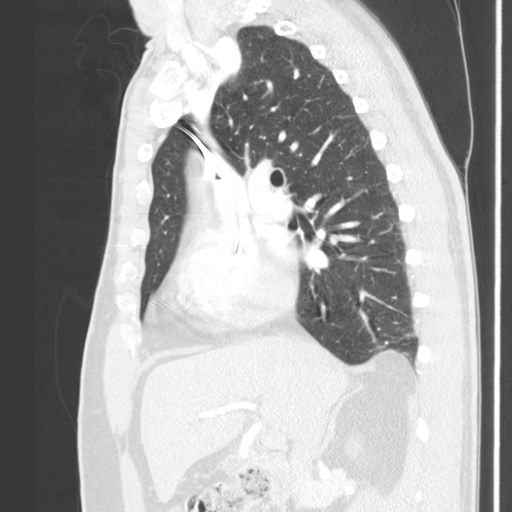
[im 82/137  lung]
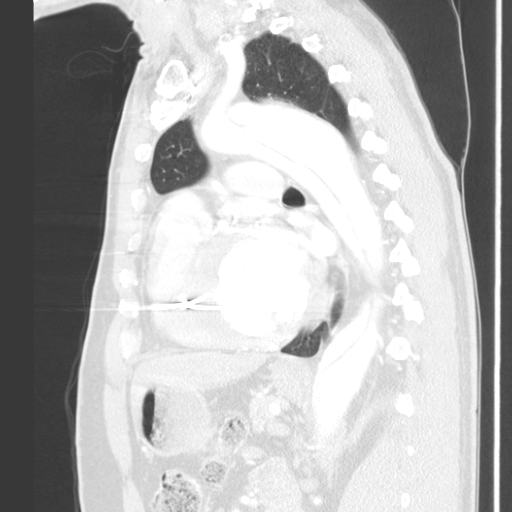
[im 96/137  lung]
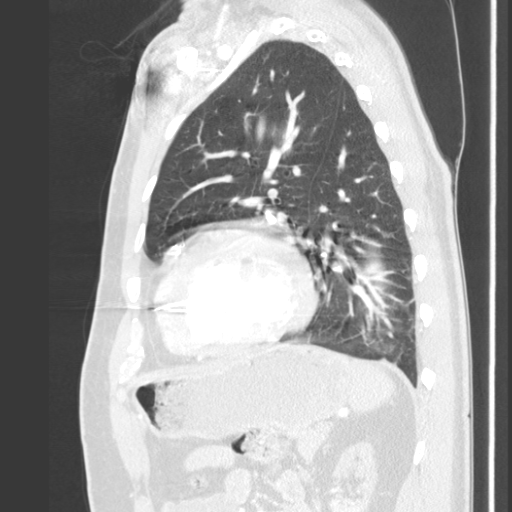
[im 109/137  lung]
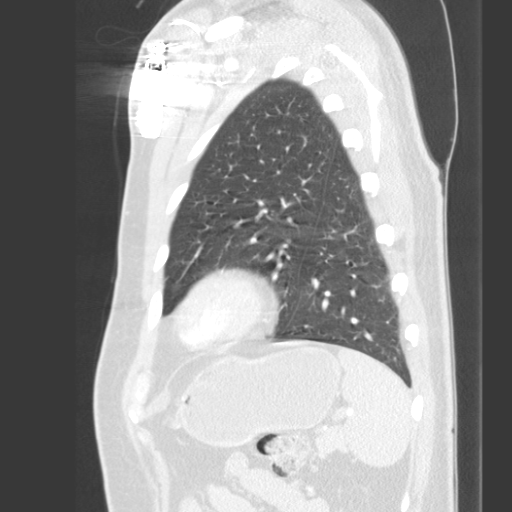
[im 123/137  lung]
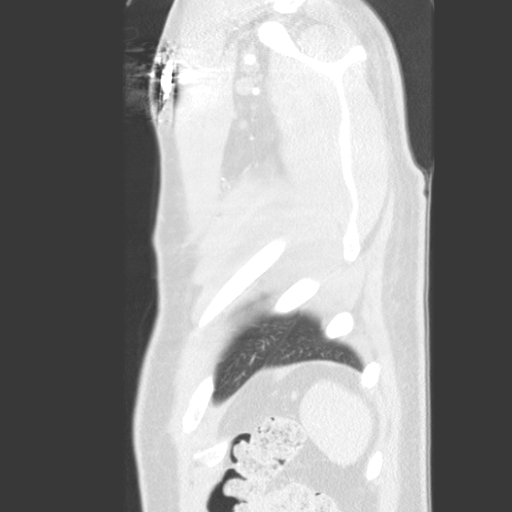

[17 of 36 positions shown; findings below may reference images not displayed]

FINDINGS: Stable patency of the ascending aortic tube graft.  The
native proximal aorta at the level of the sinuses of Valsalva shows
stable mild dilatation measuring 4.3 - 4.4 cm in diameter.
Coronary artery bypass grafts also again noted.  Stable morphology
of residual aortic dissection beginning at the takeoff of the
innominate artery.  The origin of the innominate artery shows
stable aneurysmal dilatation of approximately 2.5 - 2.6 cm.
Proximal great vessels show stable patency.

Residual aneurysmal disease of the aortic arch is stable with the
proximal arch measuring approximately 3.9 cm in diameter and the
distal arch and proximal descending thoracic aorta measuring
approximately 4.2 - 4.3 cm in diameter.  Morphology of the
dissection in the aorta shows stable pattern since the prior CTA
with anterior smaller true lumen and posterior false lumen present.
The false lumen does perfuse.

In the upper abdomen, stable dissection noted with partial
extension into the origin of the celiac axis and predominant true
lumen supply to the celiac trunk, stable dissection extending into
the proximal superior mesenteric artery with both true and false
lumen supply to the SMA, true lumen supply to a single widely
patent right renal artery and true lumen supply to the left renal
artery.  The left kidney is again noted to be atrophic.

Review of the MIP images confirms the above findings.
IMPRESSION: Stable patency of the ascending thoracic aortic graft and native
thoracic aorta.  Stable morphology of residual dissection and
aneurysmal disease of the aortic root and arch.

## 2012-02-19 ENCOUNTER — Ambulatory Visit (HOSPITAL_COMMUNITY)
Admission: RE | Admit: 2012-02-19 | Discharge: 2012-02-19 | Disposition: A | Discharge status: Home / Self Care | Payer: Medicaid Other | Source: Ambulatory Visit | Attending: Internal Medicine | Admitting: Internal Medicine

## 2012-02-19 VITALS — BP 138/84 | HR 61 | Wt 188.5 lb

## 2012-02-19 DIAGNOSIS — I2510 Atherosclerotic heart disease of native coronary artery without angina pectoris: Secondary | ICD-10-CM

## 2012-02-19 DIAGNOSIS — E785 Hyperlipidemia, unspecified: Secondary | ICD-10-CM

## 2012-02-19 DIAGNOSIS — I5022 Chronic systolic (congestive) heart failure: Secondary | ICD-10-CM | POA: Insufficient documentation

## 2012-02-19 DIAGNOSIS — E782 Mixed hyperlipidemia: Secondary | ICD-10-CM | POA: Insufficient documentation

## 2012-02-19 DIAGNOSIS — I10 Essential (primary) hypertension: Secondary | ICD-10-CM

## 2012-02-19 LAB — COMPREHENSIVE METABOLIC PANEL
ALT: 14 U/L (ref 0–53)
Alkaline Phosphatase: 72 U/L (ref 39–117)
BUN: 11 mg/dL (ref 6–23)
CO2: 29 mEq/L (ref 19–32)
Calcium: 9.4 mg/dL (ref 8.4–10.5)
GFR calc Af Amer: 69 mL/min — ABNORMAL LOW (ref >90)
GFR calc non Af Amer: 59 mL/min — ABNORMAL LOW (ref >90)
Glucose, Bld: 102 mg/dL — ABNORMAL HIGH (ref 70–99)
Sodium: 142 mEq/L (ref 135–145)
Total Protein: 7.9 g/dL (ref 6.0–8.3)

## 2012-02-19 LAB — LIPID PANEL
Cholesterol: 172 mg/dL (ref 0–200)
HDL: 47 mg/dL (ref >39)
Total CHOL/HDL Ratio: 3.7 RATIO

## 2012-02-19 MED ORDER — HYDRALAZINE HCL 25 MG PO TABS: 25.0000 mg | ORAL_TABLET | Freq: Three times a day (TID) | ORAL | Status: DC

## 2012-02-19 MED ORDER — ISOSORBIDE MONONITRATE ER 30 MG PO TB24: 60.0000 mg | ORAL_TABLET | Freq: Every day | ORAL | Status: DC

## 2012-02-19 MED ORDER — CARVEDILOL 25 MG PO TABS: ORAL_TABLET | ORAL | Status: DC

## 2012-02-19 NOTE — Assessment & Plan Note (Signed)
Goal LDL < 70. Due for repeat lipids today. Continue statin.

## 2012-02-19 NOTE — Progress Notes (Signed)
History of Present Illness: Primary Cardiologist:  Dr. Arvilla Meres  Chad [name] is a 51 y.o. male with history of severe HTN, coronary artery disease status post previous myocardial infarction and bypass surgery in 2006 with DES to native PDA in 2011.  He also has a history congestive heart failure secondary to ischemic cardiomyopathy with EF in 30-35% range.  He is s/p single chamber ICD.  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 He underwent emergent repair of proximal aorta and reimplantation of his CABG grafts.  Carotids u/s in 2011 0-39% bilaterally with highly vascular lesion in left thyroid lobe.  Biopsy showed Hurthle cell lesion.  Saw [name] who is planning sub-total thyroidectomy on 6/26.   Here for routine f/u. Had ETT/Myoview in 9/12: Walked 8:46 on [place]. Moderate-sized fixed inferior perfusion defect consistent with prior MI. EF 31% with septal hypokinesis. There was also inferior hypokinesis. Similar to prior study.   Doing fairly well. Does get CP when BP goes up and has been following very closely. When SBP > 140. Takes extra hydralazine +/- Imdur or Norvasc. Says BP gets up to 140 almost every day but he is hesitant to increase baseline doses as sometimes his BP is really good for 18 hours at a time and he doesn't want to go too low. Remains active walking every day. No orthopnea, PND or edema. Weighing 2-3x/day. As he is losing weight has to cut his meds back. Now taking carvedilol 1.5x/daily  and hydralazine 25 bid.   Past Medical History  Diagnosis Date  . CAD (coronary artery disease)     a. s/p CABG 2006;  b. DES to PDA 2011 (cath: Dx not seen, dRCA/PDA tx with DES; S-PDA occluded (culprit), S-Dx occluded, S-RI and OM ok, L-LAD ok  . HTN (hypertension)     severe  . Chronic systolic heart failure     a. echo 8/11: EF 35%, mod LVH, Grade 1 diast dysfxn, mild AI, mild MR, inf and post HK  . Aortic dissection,  thoracoabdominal     7/10: Type I s/p repair  . CRI (chronic renal insufficiency)   . Thyroid cancer     Hertle Cell  . HLD (hyperlipidemia)   . Anemia   . Gout   . AICD (automatic cardioverter/defibrillator) present   . Carotid stenosis     dopplers 2011: 0-39% bilat.  . Chest pain syndrome     Current Outpatient Prescriptions  Medication Sig Dispense Refill  . amLODipine (NORVASC) 10 MG tablet TAKE ONE TABLET BY MOUTH EVERY DAY  30 tablet  6  . amoxicillin (AMOXIL) 500 MG capsule Take 4 caps 1 hour before dental appointment.      [name] aspirin 325 MG tablet Take 325 mg by mouth daily. Patient splits tablet in fourths       . atorvastatin (LIPITOR) 40 MG tablet Take 40 mg by mouth every other day.       . carvedilol (COREG) 25 MG tablet Take 1 tablet (25 mg total) by mouth 2 (two) times daily.  60 tablet  11  . digoxin (LANOXIN) 0.25 MG tablet Take 250 mcg by mouth daily.       [name] docusate sodium (COLACE) 100 MG capsule Take 100 mg by mouth as needed.      . furosemide (LASIX) 80 MG tablet Take 1 tablet (80 mg total) by mouth as needed.  30 tablet  11  .  hydrALAZINE (APRESOLINE) 25 MG tablet Take 75 mg by mouth 3 (three) times daily.        . isosorbide mononitrate (IMDUR) 30 MG 24 hr tablet Take 1 tablet (30 mg total) by mouth daily.  30 tablet  6  . lisinopril (PRINIVIL,ZESTRIL) 20 MG tablet Take 1 tablet (20 mg total) by mouth daily.  30 tablet  6  . nitroGLYCERIN (NITROSTAT) 0.4 MG SL tablet Place 1 tablet (0.4 mg total) under the tongue every 5 (five) minutes as needed.  25 tablet  6  . oxycodone (OXYCONTIN) 30 MG TB12 Take 30 mg by mouth every 12 (twelve) hours.      [name] PLAVIX 75 MG tablet TAKE ONE TABLET BY MOUTH EVERY DAY WITH FOOD  30 each  6  . potassium chloride SA (K-DUR,KLOR-CON) 20 MEQ tablet Take 20 mEq by mouth daily. 1 tab when pt takes lasix       . vardenafil (LEVITRA) 20 MG tablet Take 20 mg by mouth daily as needed. For Erectile Dysfunction      .  HYDROcodone-acetaminophen (NORCO) 10-325 MG per tablet Take 1 tablet by mouth every 6 (six) hours as needed for pain.  4 tablet  0  . DISCONTD: digoxin (LANOXIN) 0.25 MG tablet TAKE ONE TABLET BY MOUTH EVERY DAY  30 tablet  6  . DISCONTD: HYDROcodone-acetaminophen (NORCO) 10-325 MG per tablet Take 1 tablet by mouth every 6 (six) hours as needed. May take every 4 to 6 hours as needed for severe pain         Allergies  Allergen Reactions  . Lipitor (Atorvastatin Calcium) Anaphylaxis  . Sulfonamide Derivatives Shortness Of Breath  . Iohexol      Desc: PT HAS ANAPHYLAXIS WITH CONTRAST MEDIA!, Onset Date: [id number]   Code: SOB, Desc: ok w/ 13hr prep//a.c., Onset Date: [id number]   . Latex     REACTION: Reaction not known  . Zocor (Simvastatin)     Vital Signs: BP 138/84  Pulse 61  Wt 188 lb 8 oz (85.503 kg)  SpO2 99%  PHYSICAL EXAM: Well nourished, well developed, in no acute distress HEENT: normal Neck: no JVD Cardiac:  normal S1, S2; RRR; +s4 2/6 SEM RSB Lungs:  clear to auscultation bilaterally, no wheezing, rhonchi or rales Abd: soft, nontender, no hepatomegaly Ext: no edema Skin: warm and dry Neuro:  CNs 2-12 intact, no focal abnormalities noted    ASSESSMENT AND PLAN:

## 2012-02-19 NOTE — Patient Instructions (Signed)
Take Carvedilol 25 mg in the AM and 12.5 mg (1/2 tab) in the PM Take Hydralazine 25 mg Three times a day  Take Imdur 60 mg daily (2 tabs) Wean off your Amlodipine as you increase your Hydralazine  Labs today  Your physician recommends that you schedule a follow-up appointment in: 2-3 months

## 2012-02-19 NOTE — Assessment & Plan Note (Signed)
Reviewed stress test from last fall. Stable. No significant ischemia.

## 2012-02-19 NOTE — Assessment & Plan Note (Signed)
Stable. NYHA II. No volume overload. Long discussion about meds aa above.

## 2012-02-19 NOTE — Assessment & Plan Note (Signed)
BP is overall improved but I do not like fact that he is having many 140 readings. He is currently self-titrating many of his meds. We had a long talk about this and I asked that we work together to get his regimen more stable and avoid BP spikes even to 140. He will Change carvedilol to 12.5/25. Change hydralazine to 25 tid and titrate up as tolerated. Wean amlodipine as tolerated. He will keep a close record of his BPs and meds and fax to [place] in 2 weeks.

## 2012-02-20 ENCOUNTER — Telehealth (HOSPITAL_COMMUNITY): Payer: Self-pay | Admitting: Adult Health

## 2012-02-20 MED ORDER — EQL FISH OIL 1000 MG PO CAPS: 2000.0000 mg | ORAL_CAPSULE | Freq: Every day | ORAL | Status: DC

## 2012-02-20 NOTE — Telephone Encounter (Signed)
Provided with Cholesterol results. Instructed to take 2 grams Fish Oil daily.  Chad [name] verbalized understanding.

## 2012-02-29 ENCOUNTER — Encounter (HOSPITAL_COMMUNITY): Payer: Self-pay | Admitting: Emergency Medicine

## 2012-02-29 ENCOUNTER — Ambulatory Visit (HOSPITAL_COMMUNITY): Admission: RE | Admit: 2012-02-29 | Payer: Medicaid Other | Source: Ambulatory Visit

## 2012-02-29 ENCOUNTER — Observation Stay (HOSPITAL_COMMUNITY)
Admission: EM | Admit: 2012-02-29 | Discharge: 2012-02-29 | DRG: 392 | Discharge status: Against Medical Advice | Payer: Medicaid Other | Attending: Family Medicine | Admitting: Family Medicine

## 2012-02-29 ENCOUNTER — Emergency Department (HOSPITAL_COMMUNITY): Payer: Medicaid Other

## 2012-02-29 ENCOUNTER — Other Ambulatory Visit: Payer: Self-pay

## 2012-02-29 ENCOUNTER — Inpatient Hospital Stay (HOSPITAL_COMMUNITY): Payer: Medicaid Other

## 2012-02-29 DIAGNOSIS — I10 Essential (primary) hypertension: Secondary | ICD-10-CM

## 2012-02-29 DIAGNOSIS — R079 Chest pain, unspecified: Secondary | ICD-10-CM

## 2012-02-29 DIAGNOSIS — I25810 Atherosclerosis of coronary artery bypass graft(s) without angina pectoris: Secondary | ICD-10-CM

## 2012-02-29 DIAGNOSIS — M549 Dorsalgia, unspecified: Secondary | ICD-10-CM | POA: Insufficient documentation

## 2012-02-29 DIAGNOSIS — I5020 Unspecified systolic (congestive) heart failure: Secondary | ICD-10-CM | POA: Insufficient documentation

## 2012-02-29 DIAGNOSIS — I129 Hypertensive chronic kidney disease with stage 1 through stage 4 chronic kidney disease, or unspecified chronic kidney disease: Secondary | ICD-10-CM | POA: Insufficient documentation

## 2012-02-29 DIAGNOSIS — R0789 Other chest pain: Secondary | ICD-10-CM

## 2012-02-29 DIAGNOSIS — R1011 Right upper quadrant pain: Secondary | ICD-10-CM | POA: Insufficient documentation

## 2012-02-29 DIAGNOSIS — I2510 Atherosclerotic heart disease of native coronary artery without angina pectoris: Secondary | ICD-10-CM | POA: Insufficient documentation

## 2012-02-29 DIAGNOSIS — I2589 Other forms of chronic ischemic heart disease: Secondary | ICD-10-CM | POA: Insufficient documentation

## 2012-02-29 DIAGNOSIS — R1013 Epigastric pain: Principal | ICD-10-CM | POA: Insufficient documentation

## 2012-02-29 DIAGNOSIS — N189 Chronic kidney disease, unspecified: Secondary | ICD-10-CM | POA: Insufficient documentation

## 2012-02-29 DIAGNOSIS — I5022 Chronic systolic (congestive) heart failure: Secondary | ICD-10-CM | POA: Insufficient documentation

## 2012-02-29 LAB — DIFFERENTIAL
Eosinophils Absolute: 0.1 10*3/uL (ref 0.0–0.7)
Eosinophils Relative: 3 % (ref 0–5)
Lymphocytes Relative: 30 % (ref 12–46)
Lymphs Abs: 1.1 10*3/uL (ref 0.7–4.0)
Monocytes Absolute: 0.3 10*3/uL (ref 0.1–1.0)

## 2012-02-29 LAB — CBC
HCT: 37.8 % — ABNORMAL LOW (ref 39.0–52.0)
HCT: 37 % — ABNORMAL LOW (ref 39.0–52.0)
Hemoglobin: 12.4 g/dL — ABNORMAL LOW (ref 13.0–17.0)
MCH: 27.9 pg (ref 26.0–34.0)
MCH: 27.3 pg (ref 26.0–34.0)
MCV: 81.1 fL (ref 78.0–100.0)
MCV: 81.3 fL (ref 78.0–100.0)
RBC: 4.66 MIL/uL (ref 4.22–5.81)
RBC: 4.55 MIL/uL (ref 4.22–5.81)
WBC: 3.8 10*3/uL — ABNORMAL LOW (ref 4.0–10.5)
WBC: 4.3 10*3/uL (ref 4.0–10.5)

## 2012-02-29 LAB — CARDIAC PANEL(CRET KIN+CKTOT+MB+TROPI)
CK, MB: 2 ng/mL (ref 0.3–4.0)
CK, MB: 2 ng/mL (ref 0.3–4.0)
Relative Index: 1.4 (ref 0.0–2.5)
Total CK: 147 U/L (ref 7–232)
Troponin I: <0.3 ng/mL (ref <0.30)

## 2012-02-29 LAB — COMPREHENSIVE METABOLIC PANEL
ALT: 36 U/L (ref 0–53)
AST: 57 U/L — ABNORMAL HIGH (ref 0–37)
AST: 72 U/L — ABNORMAL HIGH (ref 0–37)
Albumin: 3.8 g/dL (ref 3.5–5.2)
Albumin: 3.6 g/dL (ref 3.5–5.2)
Alkaline Phosphatase: 76 U/L (ref 39–117)
BUN: 17 mg/dL (ref 6–23)
CO2: 27 mEq/L (ref 19–32)
Calcium: 9.5 mg/dL (ref 8.4–10.5)
Calcium: 9.1 mg/dL (ref 8.4–10.5)
Chloride: 101 mEq/L (ref 96–112)
Chloride: 106 mEq/L (ref 96–112)
Creatinine, Ser: 1.3 mg/dL (ref 0.50–1.35)
Creatinine, Ser: 1.39 mg/dL — ABNORMAL HIGH (ref 0.50–1.35)
GFR calc Af Amer: 73 mL/min — ABNORMAL LOW (ref >90)
GFR calc non Af Amer: 63 mL/min — ABNORMAL LOW (ref >90)
Glucose, Bld: 141 mg/dL — ABNORMAL HIGH (ref 70–99)
Potassium: 3.3 mEq/L — ABNORMAL LOW (ref 3.5–5.1)
Sodium: 138 mEq/L (ref 135–145)
Total Bilirubin: 0.6 mg/dL (ref 0.3–1.2)
Total Protein: 7 g/dL (ref 6.0–8.3)
Total Protein: 7 g/dL (ref 6.0–8.3)

## 2012-02-29 LAB — LIPID PANEL
HDL: 44 mg/dL (ref >39)
LDL Cholesterol: 84 mg/dL (ref 0–99)
Total CHOL/HDL Ratio: 3.4 RATIO
Triglycerides: 111 mg/dL (ref <150)
VLDL: 22 mg/dL (ref 0–40)

## 2012-02-29 LAB — POCT I-STAT TROPONIN I: Troponin i, poc: 0.03 ng/mL (ref 0.00–0.08)

## 2012-02-29 LAB — LIPASE, BLOOD: Lipase: 25 U/L (ref 11–59)

## 2012-02-29 LAB — TSH: TSH: 1.994 u[IU]/mL (ref 0.350–4.500)

## 2012-02-29 LAB — HEMOGLOBIN A1C
Hgb A1c MFr Bld: 6 % — ABNORMAL HIGH (ref <5.7)
Mean Plasma Glucose: 126 mg/dL — ABNORMAL HIGH (ref <117)

## 2012-02-29 MED ORDER — DIGOXIN 250 MCG PO TABS
250.0000 ug | ORAL_TABLET | Freq: Every day | ORAL | Status: DC
  Filled 2012-02-29: qty 1

## 2012-02-29 MED ORDER — PREDNISONE 50 MG PO TABS
50.0000 mg | ORAL_TABLET | Freq: Four times a day (QID) | ORAL | Status: DC
Start: 2012-02-29 — End: 2012-02-29
  Administered 2012-02-29: 50 mg via ORAL
  Filled 2012-02-29 (×3): qty 1

## 2012-02-29 MED ORDER — SODIUM CHLORIDE 0.9 % IV SOLN: INTRAVENOUS | Status: DC

## 2012-02-29 MED ORDER — SODIUM CHLORIDE 0.9 % IV SOLN
INTRAVENOUS | Status: DC
  Administered 2012-02-29: 02:00:00 via INTRAVENOUS

## 2012-02-29 MED ORDER — HYDRALAZINE HCL 25 MG PO TABS
25.0000 mg | ORAL_TABLET | Freq: Three times a day (TID) | ORAL | Status: DC
  Filled 2012-02-29 (×3): qty 1

## 2012-02-29 MED ORDER — GI COCKTAIL ~~LOC~~
30.0000 mL | Freq: Once | ORAL | Status: AC
  Administered 2012-02-29: 30 mL via ORAL

## 2012-02-29 MED ORDER — PREDNISONE 10 MG PO TABS
10.0000 mg | ORAL_TABLET | Freq: Four times a day (QID) | ORAL | Status: DC
  Filled 2012-02-29 (×2): qty 1

## 2012-02-29 MED ORDER — POTASSIUM CHLORIDE CRYS ER 20 MEQ PO TBCR
40.0000 meq | EXTENDED_RELEASE_TABLET | Freq: Once | ORAL | Status: AC
  Administered 2012-02-29: 40 meq via ORAL
  Filled 2012-02-29: qty 2

## 2012-02-29 MED ORDER — SODIUM CHLORIDE 0.9 % IV SOLN
INTRAVENOUS | Status: DC
  Administered 2012-02-29: 05:00:00 via INTRAVENOUS

## 2012-02-29 MED ORDER — DIPHENHYDRAMINE HCL 50 MG PO CAPS
50.0000 mg | ORAL_CAPSULE | Freq: Once | ORAL | Status: DC
  Filled 2012-02-29: qty 1

## 2012-02-29 MED ORDER — PREDNISONE 50 MG PO TABS
50.0000 mg | ORAL_TABLET | Freq: Every day | ORAL | Status: DC
  Filled 2012-02-29 (×3): qty 1

## 2012-02-29 MED ORDER — ISOSORBIDE MONONITRATE ER 60 MG PO TB24
60.0000 mg | ORAL_TABLET | Freq: Every day | ORAL | Status: DC
  Filled 2012-02-29: qty 1

## 2012-02-29 MED ORDER — ASPIRIN 325 MG PO TABS
325.0000 mg | ORAL_TABLET | Freq: Every day | ORAL | Status: DC
  Filled 2012-02-29: qty 1

## 2012-02-29 MED ORDER — POTASSIUM CHLORIDE CRYS ER 20 MEQ PO TBCR
20.0000 meq | EXTENDED_RELEASE_TABLET | Freq: Every day | ORAL | Status: DC
  Filled 2012-02-29: qty 1

## 2012-02-29 MED ORDER — CLOPIDOGREL BISULFATE 75 MG PO TABS
75.0000 mg | ORAL_TABLET | Freq: Every day | ORAL | Status: DC
  Administered 2012-02-29: 75 mg via ORAL
  Filled 2012-02-29: qty 1

## 2012-02-29 MED ORDER — CARVEDILOL 25 MG PO TABS
25.0000 mg | ORAL_TABLET | Freq: Every day | ORAL | Status: DC
  Filled 2012-02-29 (×2): qty 1

## 2012-02-29 MED ORDER — GI COCKTAIL ~~LOC~~
ORAL | Status: AC
  Filled 2012-02-29: qty 30

## 2012-02-29 MED ORDER — MORPHINE SULFATE 2 MG/ML IJ SOLN: 2.0000 mg | INTRAMUSCULAR | Status: DC | PRN

## 2012-02-29 MED ORDER — SODIUM CHLORIDE 0.9 % IJ SOLN: 3.0000 mL | Freq: Two times a day (BID) | INTRAMUSCULAR | Status: DC

## 2012-02-29 MED ORDER — CARVEDILOL 12.5 MG PO TABS
12.5000 mg | ORAL_TABLET | Freq: Every evening | ORAL | Status: DC
  Filled 2012-02-29: qty 1

## 2012-02-29 MED ORDER — DOCUSATE SODIUM 100 MG PO CAPS
100.0000 mg | ORAL_CAPSULE | Freq: Every day | ORAL | Status: DC | PRN
  Filled 2012-02-29: qty 1

## 2012-02-29 MED ORDER — AMLODIPINE BESYLATE 10 MG PO TABS
10.0000 mg | ORAL_TABLET | Freq: Every day | ORAL | Status: DC
  Filled 2012-02-29: qty 1

## 2012-02-29 MED ORDER — LISINOPRIL 20 MG PO TABS
20.0000 mg | ORAL_TABLET | Freq: Every day | ORAL | Status: DC
  Filled 2012-02-29: qty 1

## 2012-02-29 MED ORDER — NITROGLYCERIN 0.4 MG SL SUBL: 0.4000 mg | SUBLINGUAL_TABLET | SUBLINGUAL | Status: DC | PRN

## 2012-02-29 NOTE — ED Provider Notes (Signed)
History     CSN: [id number]  Arrival date & time 02/29/12  0100   First MD Initiated Contact with Patient 02/29/12 0106      Chief Complaint  Patient presents with  . Chest Pain  . Abdominal Pain  . Back Pain    (Consider location/radiation/quality/duration/timing/severity/associated sxs/prior treatment) The history is provided by the patient.   patient states he developed pain in his upper abdomen tonight. He was around an hour after he ate some pasta with alfredo sauce. No nausea vomiting diarrhea. He states his not the pain is had with his heart before. He states the pain was greater than 10 for a while. This is improved. After the abdominal pain and developed some pain in his back chest. This was more his typical chest pain. No diaphoresis. No cough. He states he took 2 nitroglycerin without change in pain.  Past Medical History  Diagnosis Date  . CAD (coronary artery disease)     a. s/p CABG 2006;  b. DES to PDA 2011 (cath: Dx not seen, dRCA/PDA tx with DES; S-PDA occluded (culprit), S-Dx occluded, S-RI and OM ok, L-LAD ok  . HTN (hypertension)     severe  . Chronic systolic heart failure     a. echo 8/11: EF 35%, mod LVH, Grade 1 diast dysfxn, mild AI, mild MR, inf and post HK  . 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 CRI (chronic renal insufficiency)   . Thyroid cancer     Hertle Cell  . HLD (hyperlipidemia)   . Anemia   . Gout   . AICD (automatic cardioverter/defibrillator) present   . Carotid stenosis     dopplers 2011: 0-39% bilat.  . Chest pain syndrome     Past Surgical History  Procedure Date  . Coronary artery bypass graft 06/21/2009  . Cardiac defibrillator placement     [employer]  . Thyroidectomy, partial 06/20/11  . Lumbar disc surgery   . Status post emergency repair of a type a ascending aortic dissection with a hemiarch reconstruction of the ascending aorta  using a 28-mm hemashield graft with redo sternotomy and  revision of previous bypass grafts in june 2010.   . Median sternotomy,cabg x 7 06/29/2005    Family History  Problem Relation Age of Onset  . Coronary artery disease    . Hypertension Father   . Heart disease Father   . Early death Father   . COPD Father   . Hypertension Mother     History  Substance Use Topics  . Smoking status: Never Smoker   . Smokeless tobacco: Never Used  . Alcohol Use: No      Review of Systems  Constitutional: Negative for activity change and appetite change.  HENT: Negative for neck stiffness.   Eyes: Negative for pain.  Respiratory: Negative for chest tightness and shortness of breath.   Cardiovascular: Positive for chest pain. Negative for leg swelling.  Gastrointestinal: Positive for abdominal pain. Negative for nausea, vomiting and diarrhea.  Genitourinary: Negative for flank pain.  Musculoskeletal: Positive for back pain.  Skin: Negative for rash.  Neurological: Negative for weakness, numbness and headaches.  Psychiatric/Behavioral: Negative for behavioral problems.    Allergies  Lipitor; Sulfonamide derivatives; Iohexol; Latex; and Zocor  Home Medications   No current outpatient prescriptions on file.  BP 120/64  Pulse 60  Temp(Src) 97.8 F (36.6 C) (Oral)  Resp 12  SpO2 94%  Physical Exam  Nursing  note and vitals reviewed. Constitutional: He is oriented to person, place, and time. He appears well-developed and well-nourished.  HENT:  Head: Normocephalic and atraumatic.  Eyes: EOM are normal. Pupils are equal, round, and reactive to light.  Neck: Normal range of motion. Neck supple.  Cardiovascular: Normal rate and regular rhythm.   Murmur heard. Pulmonary/Chest: Effort normal and breath sounds normal.  Abdominal: Soft. Bowel sounds are normal. He exhibits no distension and no mass. There is no tenderness. There is no rebound and no guarding.  Musculoskeletal: Normal range of motion. He exhibits no edema.  Neurological: He  is alert and oriented to person, place, and time. No cranial nerve deficit.  Skin: Skin is warm and dry.  Psychiatric: He has a normal mood and affect.    ED Course  Procedures (including critical care time)  Labs Reviewed  CBC - Abnormal; Notable for the following:    WBC 3.8 (*)    HCT 37.8 (*)    All other components within normal limits  COMPREHENSIVE METABOLIC PANEL - Abnormal; Notable for the following:    Potassium 3.3 (*)    Glucose, Bld 141 (*)    AST 57 (*) HEMOLYSIS AT THIS LEVEL MAY AFFECT RESULT   GFR calc non Af Amer 63 (*)    GFR calc Af Amer 73 (*)    All other components within normal limits  DIFFERENTIAL  LIPASE, BLOOD  POCT I-STAT TROPONIN I  CARDIAC PANEL(CRET KIN+CKTOT+MB+TROPI)  PRO B NATRIURETIC PEPTIDE  CARDIAC PANEL(CRET KIN+CKTOT+MB+TROPI)  CARDIAC PANEL(CRET KIN+CKTOT+MB+TROPI)  LIPID PANEL  COMPREHENSIVE METABOLIC PANEL  CBC  HEMOGLOBIN A1C  TSH   Dg Chest 2 View  02/29/2012  *RADIOLOGY REPORT*  Clinical Data: Epigastric pain, shortness of breath  CHEST - 2 VIEW  Comparison: 12/02/2011 Correlation:  CT chest 09/27/2011  Findings: Enlargement of cardiac silhouette post CABG. Left subclavian AICD lead tip projects over right ventricle. Enlargement of distal aortic arch in patient with prior aortic dissection, grossly stable. Pulmonary vascularity normal. Minimal right basilar atelectasis. Lungs otherwise clear. No pleural effusion or pneumothorax. Bones unremarkable.  IMPRESSION: Enlargement cardiac silhouette post CABG and AICD. Minimal right basilar atelectasis. Prominent aortic arch, corresponding to known aortic dissection, grossly stable.  Original Report Authenticated By: Lollie Marrow, M.D.     1. Abdominal pain   2. Chest pain   3. CAD, ARTERY BYPASS GRAFT      Date: 02/29/2012  Rate: 84  Rhythm: normal sinus rhythm and premature ventricular contractions (PVC)  QRS Axis: normal  Intervals: normal  ST/T Wave abnormalities: nonspecific  ST/T changes  Conduction Disutrbances:none  Narrative Interpretation: Patient is 3 episodes of bigeminy and one PVC on EKG. ST/T changes are stable.  Old EKG Reviewed: changes noted    MDM  Abdominal pain. Also pain radiating to his chest. EKG is stable with new bigeminy. Cardiac enzymes are negative. X-rays stable. He is a known aortic dissection that goes down to his abdomen. He may need another CT, but he needs a 13 hour prep due to allergy. Ultrasound was also done to evaluate his gallbladder. He'll be admitted his [facility]. He was also seen by cardiology consult       Juliet Rude. Rubin Payor, MD 02/29/12 [phone]

## 2012-02-29 NOTE — Progress Notes (Signed)
Pt informed nurse that he had taken all his home medications. Medications included lisinopril, lasix, coreg, dig, norvasc, asa. Notified MD and charge nurse. Pt was informed that he could no longer have medications in room with him. Pt threatened to leave AMA. Pt educated on the reasons why he could not self medicate in hospital. (HR low so we would hold some of his medicines). Pt stated that if we took his medicines he would leave against our will. Pt informed on the various options that we could provide him with (count and hold medicines in pharmacy until d/c, have a family member take them home). Pt refused options and decided to sign out AMA. Paper signed. Pt removed his own IV. Nurse unaware to check for intact tip because pt threw IV away in sharps container. MD is aware of leaving AMA due to her being in the room when final decision was made. Will continue to monitor patient until he leaves floor. Ramond Craver, RN

## 2012-02-29 NOTE — ED Notes (Signed)
Pt stated that he was having sharp LU abdominal pain that radiated to his left chest and then to his side of his chest and in between his shoulder blades. Pt stated no N/V and slight SOB. Pt was having a cold sweat upon arrival. Currently, pt stated that he does not have any pain or respiratory distress. No dysrhythmias noted. Lung sounds clear. Bowel sounds present. Will continue to monitor.

## 2012-02-29 NOTE — H&P (Signed)
Chad [name] is an 51 y.o. male.   Chief Complaint: chest and epigastric/RUQ abdominal pain HPI: This is a 51 year old African-American male presenting with above. After eating dinner (fettucine alfredo), the patient started experiencing gradual epigastric and RUQ abdominal pain that radiated to his back. He took several Tums, however, no noticeable improvement in pain. His mother before she died from a heart attack 10-28-2012presented with similar symptoms. The pain worsened, and he came to the ED after he also started experiencing left-sided chest pain.  At initial presentation to the ED, the patient reported improvement of abdominal pain but worsening of chest pain. The chest pain is now improved without receiving any medications.   Past Medical History  Diagnosis Date  . CAD (coronary artery disease)     a. s/p CABG 2006;  b. DES to PDA 2011 (cath: Dx not seen, dRCA/PDA tx with DES; S-PDA occluded (culprit), S-Dx occluded, S-RI and OM ok, L-LAD ok  . HTN (hypertension)     severe  . Chronic systolic heart failure     a. echo 8/11: EF 35%, mod LVH, Grade 1 diast dysfxn, mild AI, mild MR, inf and post HK  . 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 CRI (chronic renal insufficiency)   . Thyroid cancer     Hertle Cell  . HLD (hyperlipidemia)   . Anemia   . Gout   . AICD (automatic cardioverter/defibrillator) present   . Carotid stenosis     dopplers 2011: 0-39% bilat.  . Chest pain syndrome     Past Surgical History  Procedure Date  . Coronary artery bypass graft 06/21/2009  . Cardiac defibrillator placement     [employer]  . Thyroidectomy, partial 06/20/11  . Lumbar disc surgery   . Status post emergency repair of a type a ascending aortic dissection with a hemiarch reconstruction of the ascending aorta  using a 28-mm hemashield graft with redo sternotomy and revision of previous bypass grafts in june 2010.   . Median sternotomy,cabg x 7  06/29/2005    Family History  Problem Relation Age of Onset  . Coronary artery disease    . Hypertension Father   . Heart disease Father   . Early death Father   . COPD Father   . Hypertension Mother   Father died in his 30s from a heart attack. Mother died in her 32s from a heart attack.   Social History:  reports that he has never smoked. He has never used smokeless tobacco. He reports that he does not drink alcohol or use illicit drugs. Lives with 34 year old daughter at home. He has 7 children total. He does not work; on disability.   Allergies:  Allergies  Allergen Reactions  . Lipitor (Atorvastatin Calcium) Anaphylaxis    Large doses  . Sulfonamide Derivatives Shortness Of Breath  . Iohexol      Desc: PT HAS ANAPHYLAXIS WITH CONTRAST MEDIA!, Onset Date: 16109604   Code: SOB, Desc: ok w/ 13hr prep//a.c., Onset Date: 54098119   . Latex Other (See Comments)    With long periods of exposure REACTION: Reaction not known  . Zocor (Simvastatin)     Medications Prior to Admission  Medication Dose Route Frequency Provider Last Rate Last Dose  . 0.9 %  sodium chloride infusion   Intravenous Continuous Juliet Rude. Rubin Payor, MD 125 mL/hr at 02/29/12 0131     Medications Prior to Admission  Medication Sig Dispense Refill  .  amLODipine (NORVASC) 10 MG tablet TAKE ONE TABLET BY MOUTH EVERY DAY  30 tablet  6  . amoxicillin (AMOXIL) 500 MG capsule Take 4 caps 1 hour before dental appointment.      [name] aspirin 325 MG tablet Take 325 mg by mouth daily. Patient splits tablet in fourths       . atorvastatin (LIPITOR) 40 MG tablet Take 20 mg by mouth every other day.       . carvedilol (COREG) 25 MG tablet Take 25 mg in AM and 12.5 mg (1/2 tab) in the PM      . digoxin (LANOXIN) 0.25 MG tablet Take 250 mcg by mouth daily.       [name] docusate sodium (COLACE) 100 MG capsule Take 100 mg by mouth as needed.      . furosemide (LASIX) 80 MG tablet Take 1 tablet (80 mg total) by mouth as needed.   30 tablet  11  . hydrALAZINE (APRESOLINE) 25 MG tablet Take 1 tablet (25 mg total) by mouth 3 (three) times daily.      . isosorbide mononitrate (IMDUR) 30 MG 24 hr tablet Take 2 tablets (60 mg total) by mouth daily.  60 tablet  6  . lisinopril (PRINIVIL,ZESTRIL) 20 MG tablet Take 1 tablet (20 mg total) by mouth daily.  30 tablet  6  . nitroGLYCERIN (NITROSTAT) 0.4 MG SL tablet Place 1 tablet (0.4 mg total) under the tongue every 5 (five) minutes as needed.  25 tablet  6  . Omega-3 Fatty Acids (EQL FISH OIL) 1000 MG CAPS Take 2 capsules (2,000 mg total) by mouth daily.  90 each  0  . oxycodone (OXYCONTIN) 30 MG TB12 Take 30 mg by mouth every 12 (twelve) hours.      [name] PLAVIX 75 MG tablet TAKE ONE TABLET BY MOUTH EVERY DAY WITH FOOD  30 each  6  . potassium chloride SA (K-DUR,KLOR-CON) 20 MEQ tablet Take 20 mEq by mouth daily. 1 tab when pt takes lasix       . vardenafil (LEVITRA) 20 MG tablet Take 20 mg by mouth daily as needed. For Erectile Dysfunction      . HYDROcodone-acetaminophen (NORCO) 10-325 MG per tablet Take 1 tablet by mouth every 6 (six) hours as needed for pain.  4 tablet  0    Results for orders placed during the hospital encounter of 02/29/12 (from the past 48 hour(s))  CBC     Status: Abnormal   Collection Time   02/29/12  1:29 AM      Component Value Range Comment   WBC 3.8 (*) 4.0 - 10.5 (K/uL)    RBC 4.66  4.22 - 5.81 (MIL/uL)    Hemoglobin 13.0  13.0 - 17.0 (g/dL)    HCT 14.7 (*) 82.9 - 52.0 (%)    MCV 81.1  78.0 - 100.0 (fL)    MCH 27.9  26.0 - 34.0 (pg)    MCHC 34.4  30.0 - 36.0 (g/dL)    RDW 56.2  13.0 - 86.5 (%)    Platelets 166  150 - 400 (K/uL)   DIFFERENTIAL     Status: Normal   Collection Time   02/29/12  1:29 AM      Component Value Range Comment   Neutrophils Relative 58  43 - 77 (%)    Neutro Abs 2.2  1.7 - 7.7 (K/uL)    Lymphocytes Relative 30  12 - 46 (%)    Lymphs Abs 1.1  0.7 -  4.0 (K/uL)    Monocytes Relative 9  3 - 12 (%)    Monocytes Absolute 0.3   0.1 - 1.0 (K/uL)    Eosinophils Relative 3  0 - 5 (%)    Eosinophils Absolute 0.1  0.0 - 0.7 (K/uL)    Basophils Relative 0  0 - 1 (%)    Basophils Absolute 0.0  0.0 - 0.1 (K/uL)   COMPREHENSIVE METABOLIC PANEL     Status: Abnormal   Collection Time   02/29/12  1:29 AM      Component Value Range Comment   Sodium 138  135 - 145 (mEq/L)    Potassium 3.3 (*) 3.5 - 5.1 (mEq/L)    Chloride 101  96 - 112 (mEq/L)    CO2 27  19 - 32 (mEq/L)    Glucose, Bld 141 (*) 70 - 99 (mg/dL)    BUN 17  6 - 23 (mg/dL)    Creatinine, Ser 1.61  0.50 - 1.35 (mg/dL)    Calcium 9.5  8.4 - 10.5 (mg/dL)    Total Protein 7.0  6.0 - 8.3 (g/dL)    Albumin 3.8  3.5 - 5.2 (g/dL)    AST 57 (*) 0 - 37 (U/L) HEMOLYSIS AT THIS LEVEL MAY AFFECT RESULT   ALT 36  0 - 53 (U/L)    Alkaline Phosphatase 76  39 - 117 (U/L)    Total Bilirubin 0.6  0.3 - 1.2 (mg/dL)    GFR calc non Af Amer 63 (*) >90 (mL/min)    GFR calc Af Amer 73 (*) >90 (mL/min)   LIPASE, BLOOD     Status: Normal   Collection Time   02/29/12  1:29 AM      Component Value Range Comment   Lipase 25  11 - 59 (U/L)   POCT I-STAT TROPONIN I     Status: Normal   Collection Time   02/29/12  1:50 AM      Component Value Range Comment   Troponin i, poc 0.03  0.00 - 0.08 (ng/mL)    Comment 3             Dg Chest 2 View  02/29/2012  *RADIOLOGY REPORT*  Clinical Data: Epigastric pain, shortness of breath  CHEST - 2 VIEW  Comparison: 12/02/2011 Correlation:  CT chest 09/27/2011  Findings: Enlargement of cardiac silhouette post CABG. Left subclavian AICD lead tip projects over right ventricle. Enlargement of distal aortic arch in patient with prior aortic dissection, grossly stable. Pulmonary vascularity normal. Minimal right basilar atelectasis. Lungs otherwise clear. No pleural effusion or pneumothorax. Bones unremarkable.  IMPRESSION: Enlargement cardiac silhouette post CABG and AICD. Minimal right basilar atelectasis. Prominent aortic arch, corresponding to known aortic  dissection, grossly stable.  Original Report Authenticated By: Lollie Marrow, M.D.    ROS Gen: denies fevers/chills/myalgias HEENT: denies sore throat, cough CV: denies palpitations Pulm: denies difficulty breathing Abd: denies nausea/vomiting/diarrhea/constipation Ext: some leg swelling past week Psych: denies depression  Blood pressure 124/68, temperature 97.8 F (36.6 C), temperature source Oral, resp. rate 12, SpO2 99.00%. Physical Exam  Gen: NAD Psych: engaged, appropriate HEENT: normal conjunctiva; PERRL; MMM CV: 3/6 systolic murmur; RRR Pulm: CTAB without w/r/r; NI WOB Chest: mild TTP substernum and left chest Abd: NABS, soft, mild tenderness RUQ/epigastrum, ND; well-healed scars on chest and upper abdomen (from CABG and aortic dissection repair) Ext: no edema Skin: no rash  Assessment/Plan This is a 51 YO AAM with multiple cardiovascular medical conditions presenting with left-sided  and epigastric/RUQ abdominal pain after eating dinner 03/06.   The patient's past medical history is concerning for multiple cardiovascular conditions: CAD s/p CABG 2006 and s/p DES to PDA 2011, severe HTN, ischemic cardiomyopathy (he has had multiple MIs, first when he was 51 YO) and chronic systolic HF (ECHO 07/2010: EF 40%, diffuse hypokinesis and grade 1 diastolic dysfunction; mild AVR and MVR) s/p AICD, history of type 1 thoracoabdominal aortic dissection (s/p repair in 06/2009), chronic renal insufficiency (baseline Cr 1.2-1.4), history of carotid stenosis (2011 dopplers: 0-39% bilaterally, and chest pain syndrome. He also has a history of Hertle cell thyroid cancer (scheduled for surgery 05/2012).   Due to his significant history, we will admit him for observation on telemetry. His cardiologist's (Dr. Prescott Gum) group was asked to consult.  Atypical chest and epigastric/RUQ abdominal pain.  His first set of CE negative. ECG does not show any ST/T-wave abnormalities. Chest pain started  after eating fatty meal and is now improved without NTG/ASA/morphine or other pain medications.  -Will rule-out ACS by cycling cardiac enzymes and repeating ECG in the AM. ASA, NTG and morphine prn, beta-blocker.  -Does not appear fluid overloaded on physical exam or CXR. Hold home prn Lasix. Will check BNP. Daily weights, strict I/O.  -Will follow-up cardiology Corinda Gubler) consult. -Risk stratify: TSH, HgbA1c, lipid panel -Repeat CTA. Due to contrast allergy, will pre-treat with steroids and benadryl (which he tolerated well previously) -Monitor on telemetry. -History of biliary sludge. LFTs abnormal for only mildly elevated AST. Will f/u RUQ ultrasound, however, abdominal pain mild.   CV History of type 1 thoracoabdominal aortic dissection--s/p repair 06/2009; 08/202 CTA showed stable dissection with graft in place  History of severe HTN History of CAD s/p CABG 2006 and DES to PDA 2011 History of ischemic CM and chronic systolic HF s/p AICD History of carotid stenosis--2011 dopplers: 0-39% stenosis bilaterally History of chest pain syndrome  History of HLD -Continue home Norvasc 10, ASA 325/Plavix, Coreg 25 AM and 12.5 PM, lisinopril 20, digoxin 0.25, hydralazine 25 tid, isosorbide 60 -Hold statin  RENAL History of chronic renal insufficiency--baseline Cr 1.2-1.4 -Cr stable.   ENDO History of thyroid cancer (Hertle cell)--scheduled for surgery 05/2012 -Checking TSH.   FEN/GI -K 3.3. Will replete. Continue home K qd.  -Diet: heart-healthy  PPx DVT PPx: heparin SQ SUP: not indicated  Disposition: pending clinical improvement.   OH PARK, Reed Dady 02/29/2012, 3:10 AM

## 2012-02-29 NOTE — ED Notes (Addendum)
Called Ultrasound to follow up and they stated that they are at [facility] and will call us back when the get to [facility]. Will continue to monitor.

## 2012-02-29 NOTE — ED Notes (Addendum)
Per EMS, pt has been having abdominal pain radiating to chest. Pt also complaining of generalized upper back pain. Vitals: HR 80s, Resp 23-24, BP: 200/100. NSR with PVCs. 20g RAC. Per EMS,  Pt took 2 Carvedilol from his own supply while EMS was there.

## 2012-02-29 NOTE — ED Notes (Signed)
Admitting at bedside 

## 2012-02-29 NOTE — Consult Note (Signed)
Cardiology IP Consult  Reason for Consult:chest pain Referring Physician: Dr. Willaim Bane  HPI: Chad [name] is a 51 y.o.male with extensive history of CAD, ischemic cardiomyopathy, and thoracoabdominal aortic dissection s/p ascending aorta reconstruction in 2010 who was admitted to the family medicine service from the ED tonight with epigastric and chest pain.  Chad [name] has chronic intermittent chest discomfort.  Tonight he developed epigastric pain 1 hour after eating steak and pasta.  He describes the pain as severe.  He thought it was gas and took tums without relief.  He then called EMS when he began to develop chest and back pain.  He took 2 coreg pills at this time.  According to the patient his BP was elevated when they arrived but soon returned to normal.  He was also diaphoretic and nauseated.  Before arriving to the ED his symptoms had resolved and he was normotensive.  Currently he feels ok.  His mother recently passed away with an MI after complaining of similar type epigastric pain for a few days and he is very concerned about this.  His current symptoms are not similar to symptoms with his prior MI or prior aortic dissection.  He did have a exercise nuclear stress test in 08/2011 where he went for 10.1 METS without symptoms.  THere was no inducible ischemia, moderate size fixed inferior perfusion defect, EF 31%.  He also had CTA c/a/p in 09/2011 which showed his thoracic dissection to be stable.     Past Medical History  Diagnosis Date  . CAD (coronary artery disease)     a. s/p CABG 2006;  b. DES to PDA 2011 (cath: Dx not seen, dRCA/PDA tx with DES; S-PDA occluded (culprit), S-Dx occluded, S-RI and OM ok, L-LAD ok  . HTN (hypertension)     severe  . Chronic systolic heart failure     a. echo 8/11: EF 35%, mod LVH, Grade 1 diast dysfxn, mild AI, mild MR, inf and post HK  . 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 CRI (chronic renal insufficiency)   . Thyroid  cancer     Hertle Cell  . HLD (hyperlipidemia)   . Anemia   . Gout   . AICD (automatic cardioverter/defibrillator) present   . Carotid stenosis     dopplers 2011: 0-39% bilat.  . Chest pain syndrome     Past Surgical History  Procedure Date  . Coronary artery bypass graft 06/21/2009  . Cardiac defibrillator placement     [employer]  . Thyroidectomy, partial 06/20/11  . Lumbar disc surgery   . Status post emergency repair of a type a ascending aortic dissection with a hemiarch reconstruction of the ascending aorta  using a 28-mm hemashield graft with redo sternotomy and revision of previous bypass grafts in june 2010.   . Median sternotomy,cabg x 7 06/29/2005    Family History  Problem Relation Age of Onset  . Coronary artery disease    . Hypertension Father   . Heart disease Father   . Early death Father   . COPD Father   . Hypertension Mother     Social History:  reports that he has never smoked. He has never used smokeless tobacco. He reports that he does not drink alcohol or use illicit drugs.  Allergies:  Allergies  Allergen Reactions  . Lipitor (Atorvastatin Calcium) Anaphylaxis    Large doses  . Sulfonamide Derivatives Shortness Of Breath  . Iohexol  Desc: PT HAS ANAPHYLAXIS WITH CONTRAST MEDIA!, Onset Date: 45409811   Code: SOB, Desc: ok w/ 13hr prep//a.c., Onset Date: 91478295   . Latex Other (See Comments)    With long periods of exposure REACTION: Reaction not known  . Zocor (Simvastatin)     Current Facility-Administered Medications  Medication Dose Route Frequency Provider Last Rate Last Dose  . 0.9 %  sodium chloride infusion   Intravenous Continuous Lucianne Muss Park, MD      . 0.9 %  sodium chloride infusion   Intravenous Continuous Lucianne Muss Park, MD      . amLODipine (NORVASC) tablet 10 mg  10 mg Oral Daily Lucianne Muss Park, MD      . aspirin tablet 325 mg  325 mg Oral Daily Lucianne Muss Park, MD      . carvedilol (COREG) tablet 12.5 mg  12.5  mg Oral QPM Lucianne Muss Park, MD      . carvedilol (COREG) tablet 25 mg  25 mg Oral q morning - 10a Lucianne Muss Park, MD      . clopidogrel (PLAVIX) tablet 75 mg  75 mg Oral Q breakfast Lucianne Muss Park, MD      . digoxin [facility]) tablet 250 mcg  250 mcg Oral Daily Lucianne Muss Park, MD      . diphenhydrAMINE (BENADRYL) capsule 50 mg  50 mg Oral Once Priscella Mann, MD      . docusate sodium (COLACE) capsule 100 mg  100 mg Oral Daily PRN Priscella Mann, MD      . gi cocktail  30 mL Oral Once [employer]. Rubin Payor, MD   30 mL at 02/29/12 0429  . hydrALAZINE (APRESOLINE) tablet 25 mg  25 mg Oral TID Priscella Mann, MD      . isosorbide mononitrate (IMDUR) 24 hr tablet 60 mg  60 mg Oral Daily Lucianne Muss Park, MD      . lisinopril (PRINIVIL,ZESTRIL) tablet 20 mg  20 mg Oral Daily Lucianne Muss Park, MD      . morphine 2 MG/ML injection 2 mg  2 mg Intravenous Q2H PRN Lucianne Muss Park, MD      . nitroGLYCERIN (NITROSTAT) SL tablet 0.4 mg  0.4 mg Sublingual Q5 min PRN Lucianne Muss Park, MD      . potassium chloride SA (K-DUR,KLOR-CON) CR tablet 20 mEq  20 mEq Oral Daily Lucianne Muss Park, MD      . potassium chloride SA (K-DUR,KLOR-CON) CR tablet 40 mEq  40 mEq Oral Once Priscella Mann, MD      . predniSONE (DELTASONE) tablet 10 mg  10 mg Oral Q6H Lucianne Muss Park, MD      . predniSONE (DELTASONE) tablet 50 mg  50 mg Oral Q breakfast Lucianne Muss Park, MD      . sodium chloride 0.9 % injection 3 mL  3 mL Intravenous Q12H Lucianne Muss Park, MD      . DISCONTD: 0.9 %  sodium chloride infusion   Intravenous Continuous Juliet Rude. Pickering, MD 125 mL/hr at 02/29/12 0131      ROS: A full review of systems is obtained and is negative except as noted in the HPI.  Physical Exam: Blood pressure 120/64, pulse 60, temperature 97.8 F (36.6 C), temperature source Oral, resp. rate 12, SpO2 94.00%.  GENERAL: no acute distress.  EYES: Extra ocular movements are intact. There is no lid lag. Sclera is anicteric.  ENT: Oropharynx is clear.  Dentition is within normal  limits.  NECK: Supple. The thyroid is not enlarged.  LYMPH: There are no masses or lymphadenopathy present.  HEART: Regular rate and rhythm with 2/6 SEM radiating to carotids, no JVD LUNGS: Clear to auscultation There are no rales, rhonchi, or wheezes.  ABDOMEN: Soft,mild ruq ttp. There is no hepatosplenomegaly.  EXTREMITIES: No clubbing, cyanosis, or edema.  PULSES: Carotids were +2 and equal bilaterally with no bruits. DP/PT pulses were +2 and equal bilaterally.  SKIN: Warm, dry, and intact.  NEUROLOGIC: The patient was oriented to person, place, and time. No overt neurologic deficits were detected.  PSYCH: Normal judgment and insight, mood is appropriate.    Results: Results for orders placed during the hospital encounter of 02/29/12 (from the past 24 hour(s))  CBC     Status: Abnormal   Collection Time   02/29/12  1:29 AM      Component Value Range   WBC 3.8 (*) 4.0 - 10.5 (K/uL)   RBC 4.66  4.22 - 5.81 (MIL/uL)   Hemoglobin 13.0  13.0 - 17.0 (g/dL)   HCT 16.1 (*) 09.6 - 52.0 (%)   MCV 81.1  78.0 - 100.0 (fL)   MCH 27.9  26.0 - 34.0 (pg)   MCHC 34.4  30.0 - 36.0 (g/dL)   RDW 04.5  40.9 - 81.1 (%)   Platelets 166  150 - 400 (K/uL)  DIFFERENTIAL     Status: Normal   Collection Time   02/29/12  1:29 AM      Component Value Range   Neutrophils Relative 58  43 - 77 (%)   Neutro Abs 2.2  1.7 - 7.7 (K/uL)   Lymphocytes Relative 30  12 - 46 (%)   Lymphs Abs 1.1  0.7 - 4.0 (K/uL)   Monocytes Relative 9  3 - 12 (%)   Monocytes Absolute 0.3  0.1 - 1.0 (K/uL)   Eosinophils Relative 3  0 - 5 (%)   Eosinophils Absolute 0.1  0.0 - 0.7 (K/uL)   Basophils Relative 0  0 - 1 (%)   Basophils Absolute 0.0  0.0 - 0.1 (K/uL)  COMPREHENSIVE METABOLIC PANEL     Status: Abnormal   Collection Time   02/29/12  1:29 AM      Component Value Range   Sodium 138  135 - 145 (mEq/L)   Potassium 3.3 (*) 3.5 - 5.1 (mEq/L)   Chloride 101  96 - 112 (mEq/L)   CO2 27  19 - 32 (mEq/L)    Glucose, Bld 141 (*) 70 - 99 (mg/dL)   BUN 17  6 - 23 (mg/dL)   Creatinine, Ser 9.14  0.50 - 1.35 (mg/dL)   Calcium 9.5  8.4 - 78.2 (mg/dL)   Total Protein 7.0  6.0 - 8.3 (g/dL)   Albumin 3.8  3.5 - 5.2 (g/dL)   AST 57 (*) 0 - 37 (U/L)   ALT 36  0 - 53 (U/L)   Alkaline Phosphatase 76  39 - 117 (U/L)   Total Bilirubin 0.6  0.3 - 1.2 (mg/dL)   GFR calc non Af Amer 63 (*) >90 (mL/min)   GFR calc Af Amer 73 (*) >90 (mL/min)  LIPASE, BLOOD     Status: Normal   Collection Time   02/29/12  1:29 AM      Component Value Range   Lipase 25  11 - 59 (U/L)  POCT I-STAT TROPONIN I     Status: Normal   Collection Time   02/29/12  1:50 AM  Component Value Range   Troponin i, poc 0.03  0.00 - 0.08 (ng/mL)   Comment 3             CXR: prominent aortic arch, otherwise no acute changes EKG: NSR with incomplete LBBB, frequent PVC's  Assessment/Plan: 51 yo AAM with CAD/CABG, ischemic cardiomyopathy, and type I aortic dissection s/p emergent aortic arch reconstruction (2010) admitted to the family medicine service with epigastric and chest pain.  Symptoms have now resolved. 1. Chest/Epigastric pain: atypical for angina.  He reports daily intermittent chest pain although his pain tonight is somewhat different.  I suspect this may have been gastrointestinal in etiology given the correlation with food.  Given report of elevated BP have to also consider change to his dissection although less likely - agree with repeat CTA after premeds for contrast allergy (cannot get MRA due to ICD) - r/o for AMI with serial cardiac markers - if enzymes are negative, repeat stress testing will probably not be needed  2.CAD/ Ischemic Cardiomyopathy: appears fairly euvolemic - check BNP - repeat TTE in AM to eval systolic ejection murmur and followup LV systolic function - continue home Rx with ASA/plavix, lisinopril, coreg, norvasc, lipitor, imdur, hydralazine   Reighlyn Elmes 02/29/2012, 4:46 AM

## 2012-02-29 NOTE — H&P (Signed)
[facility]MT[facility] Attending Daily Note: Denny Levy MD [phone] pager office [phone] I have discussed this patient with the resident and reviewed the assessment and plan as documented above. I agree wit the resident's findings and plan. The patient came in during the night and before I could see and examine him this morning he left against medical advice.

## 2012-02-29 NOTE — ED Notes (Signed)
Made [name] RN aware that pt received a GI cocktail and needs Prednisone (which we do not stock in the ED). Also informed her that he still needs a Abdominal US

## 2012-02-29 NOTE — ED Notes (Signed)
EDP at bedside  

## 2012-02-29 NOTE — Discharge Summary (Signed)
Physician Discharge Summary  Patient ID: Chad [name] [id number] [birth year] 51 y.o.  Admit date: 02/29/2012 Discharge date: 02/29/2012  PCP: Chad Graham, MD, MD   Discharge Diagnosis: 1.Chest pain and abdominal pain r/o  2.Hx of CAD s/p CABG and STENT 3. Severe HTN 4. CHF 5. CKD 6. Chest pain syndrome 7. Hertle Cell Thyroid Cancer   Discharge Medications  Chad, [name]  Home Medication Instructions [id number]:[id number]   Printed on:02/29/12 1342  Medication Information                    amoxicillin (AMOXIL) 500 MG capsule Take 4 caps 1 hour before dental appointment.           aspirin 325 MG tablet Take 325 mg by mouth daily. Patient splits tablet in fourths            digoxin (LANOXIN) 0.25 MG tablet Take 250 mcg by mouth daily.            furosemide (LASIX) 80 MG tablet Take 1 tablet (80 mg total) by mouth as needed.           nitroGLYCERIN (NITROSTAT) 0.4 MG SL tablet Place 1 tablet (0.4 mg total) under the tongue every 5 (five) minutes as needed.           potassium chloride SA (K-DUR,KLOR-CON) 20 MEQ tablet Take 20 mEq by mouth daily. 1 tab when pt takes lasix            atorvastatin (LIPITOR) 40 MG tablet Take 20 mg by mouth every other day.            PLAVIX 75 MG tablet TAKE ONE TABLET BY MOUTH EVERY DAY WITH FOOD           vardenafil (LEVITRA) 20 MG tablet Take 20 mg by mouth daily as needed. For Erectile Dysfunction           amLODipine (NORVASC) 10 MG tablet TAKE ONE TABLET BY MOUTH EVERY DAY           lisinopril (PRINIVIL,ZESTRIL) 20 MG tablet Take 1 tablet (20 mg total) by mouth daily.           HYDROcodone-acetaminophen (NORCO) 10-325 MG per tablet Take 1 tablet by mouth every 6 (six) hours as needed for pain.           oxycodone (OXYCONTIN) 30 MG TB12 Take 30 mg by mouth every 12 (twelve) hours.           docusate sodium (COLACE) 100 MG capsule Take 100 mg by mouth as needed.           carvedilol (COREG) 25 MG tablet Take 25 mg in AM  and 12.5 mg (1/2 tab) in the PM           hydrALAZINE (APRESOLINE) 25 MG tablet Take 1 tablet (25 mg total) by mouth 3 (three) times daily.           isosorbide mononitrate (IMDUR) 30 MG 24 hr tablet Take 2 tablets (60 mg total) by mouth daily.           Omega-3 Fatty Acids (EQL FISH OIL) 1000 MG CAPS Take 2 capsules (2,000 mg total) by mouth daily.              Consults: Cardiology   Labs:  Results for orders placed during the hospital encounter of 02/29/12 (from the past 72 hour(s))  CBC     Status: Abnormal   Collection Time   02/29/12  1:29 AM      Component Value Range Comment   WBC 3.8 (*) 4.0 - 10.5 (K/uL)    RBC 4.66  4.22 - 5.81 (MIL/uL)    Hemoglobin 13.0  13.0 - 17.0 (g/dL)    HCT 91.4 (*) 78.2 - 52.0 (%)    MCV 81.1  78.0 - 100.0 (fL)    MCH 27.9  26.0 - 34.0 (pg)    MCHC 34.4  30.0 - 36.0 (g/dL)    RDW 95.6  21.3 - 08.6 (%)    Platelets 166  150 - 400 (K/uL)   DIFFERENTIAL     Status: Normal   Collection Time   02/29/12  1:29 AM      Component Value Range Comment   Neutrophils Relative 58  43 - 77 (%)    Neutro Abs 2.2  1.7 - 7.7 (K/uL)    Lymphocytes Relative 30  12 - 46 (%)    Lymphs Abs 1.1  0.7 - 4.0 (K/uL)    Monocytes Relative 9  3 - 12 (%)    Monocytes Absolute 0.3  0.1 - 1.0 (K/uL)    Eosinophils Relative 3  0 - 5 (%)    Eosinophils Absolute 0.1  0.0 - 0.7 (K/uL)    Basophils Relative 0  0 - 1 (%)    Basophils Absolute 0.0  0.0 - 0.1 (K/uL)   COMPREHENSIVE METABOLIC PANEL     Status: Abnormal   Collection Time   02/29/12  1:29 AM      Component Value Range Comment   Sodium 138  135 - 145 (mEq/L)    Potassium 3.3 (*) 3.5 - 5.1 (mEq/L)    Chloride 101  96 - 112 (mEq/L)    CO2 27  19 - 32 (mEq/L)    Glucose, Bld 141 (*) 70 - 99 (mg/dL)    BUN 17  6 - 23 (mg/dL)    Creatinine, Ser 5.78  0.50 - 1.35 (mg/dL)    Calcium 9.5  8.4 - 10.5 (mg/dL)    Total Protein 7.0  6.0 - 8.3 (g/dL)    Albumin 3.8  3.5 - 5.2 (g/dL)    AST 57 (*) 0 - 37 (U/L)  HEMOLYSIS AT THIS LEVEL MAY AFFECT RESULT   ALT 36  0 - 53 (U/L)    Alkaline Phosphatase 76  39 - 117 (U/L)    Total Bilirubin 0.6  0.3 - 1.2 (mg/dL)    GFR calc non Af Amer 63 (*) >90 (mL/min)    GFR calc Af Amer 73 (*) >90 (mL/min)   LIPASE, BLOOD     Status: Normal   Collection Time   02/29/12  1:29 AM      Component Value Range Comment   Lipase 25  11 - 59 (U/L)   PRO B NATRIURETIC PEPTIDE     Status: Abnormal   Collection Time   02/29/12  1:29 AM      Component Value Range Comment   Pro B Natriuretic peptide (BNP) 489.6 (*) 0 - 125 (pg/mL)   POCT I-STAT TROPONIN I     Status: Normal   Collection Time   02/29/12  1:50 AM      Component Value Range Comment   Troponin i, poc 0.03  0.00 - 0.08 (ng/mL)    Comment 3            CARDIAC PANEL(CRET KIN+CKTOT+MB+TROPI)     Status: Normal   Collection Time   02/29/12  3:48 AM  Component Value Range Comment   Total CK 147  7 - 232 (U/L)    CK, MB 2.0  0.3 - 4.0 (ng/mL)    Troponin I <0.30  <0.30 (ng/mL)    Relative Index 1.4  0.0 - 2.5    LIPID PANEL     Status: Normal   Collection Time   02/29/12  3:48 AM      Component Value Range Comment   Cholesterol 150  0 - 200 (mg/dL)    Triglycerides 295  <150 (mg/dL)    HDL 44  >62 (mg/dL)    Total CHOL/HDL Ratio 3.4      VLDL 22  0 - 40 (mg/dL)    LDL Cholesterol 84  0 - 99 (mg/dL)   CARDIAC PANEL(CRET KIN+CKTOT+MB+TROPI)     Status: Normal   Collection Time   02/29/12  8:26 AM      Component Value Range Comment   Total CK 134  7 - 232 (U/L)    CK, MB 2.0  0.3 - 4.0 (ng/mL)    Troponin I <0.30  <0.30 (ng/mL)    Relative Index 1.5  0.0 - 2.5    COMPREHENSIVE METABOLIC PANEL     Status: Abnormal   Collection Time   02/29/12  8:26 AM      Component Value Range Comment   Sodium 140  135 - 145 (mEq/L)    Potassium 3.7  3.5 - 5.1 (mEq/L)    Chloride 106  96 - 112 (mEq/L)    CO2 27  19 - 32 (mEq/L)    Glucose, Bld 107 (*) 70 - 99 (mg/dL)    BUN 17  6 - 23 (mg/dL)    Creatinine, Ser 1.30  (*) 0.50 - 1.35 (mg/dL)    Calcium 9.1  8.4 - 10.5 (mg/dL)    Total Protein 7.0  6.0 - 8.3 (g/dL)    Albumin 3.6  3.5 - 5.2 (g/dL)    AST 72 (*) 0 - 37 (U/L)    ALT 64 (*) 0 - 53 (U/L)    Alkaline Phosphatase 91  39 - 117 (U/L)    Total Bilirubin 0.5  0.3 - 1.2 (mg/dL)    GFR calc non Af Amer 58 (*) >90 (mL/min)    GFR calc Af Amer 67 (*) >90 (mL/min)   CBC     Status: Abnormal   Collection Time   02/29/12  8:26 AM      Component Value Range Comment   WBC 4.3  4.0 - 10.5 (K/uL)    RBC 4.55  4.22 - 5.81 (MIL/uL)    Hemoglobin 12.4 (*) 13.0 - 17.0 (g/dL)    HCT 86.5 (*) 78.4 - 52.0 (%)    MCV 81.3  78.0 - 100.0 (fL)    MCH 27.3  26.0 - 34.0 (pg)    MCHC 33.5  30.0 - 36.0 (g/dL)    RDW 69.6  29.5 - 28.4 (%)    Platelets 162  150 - 400 (K/uL)   TSH     Status: Normal   Collection Time   02/29/12  8:26 AM      Component Value Range Comment   TSH 1.994  0.350 - 4.500 (uIU/mL)     Procedures/Imaging:  Dg Chest 2 View 02/29/2012   IMPRESSION: Enlargement cardiac silhouette post CABG and AICD. Minimal right basilar atelectasis. Prominent aortic arch, corresponding to known aortic dissection, grossly stable.  Original Report Authenticated By: Lollie Marrow, M.D.  Brief Hospital Course: This is a 51 YO AAM with multiple cardiovascular medical conditions admitted for left-sided and epigastric/RUQ abdominal pain after eating dinner 03/06.  The patient's past medical history is concerning for multiple cardiovascular conditions and was admitted for observation.  POCT Troponin I and first set CE were negative and EKG did not show ST/T -wave abnormalities.  For his multiple risk factor CTA was indicated but delayed due to pt allergy to contrast and the need of prednisone/ diphenhydramine before prep for CTA.  Pt was consulted with his cardiologist's (Dr. Prescott Gum) who did not think CTA was necessary at this time.  Pt was to get abdominal u/s when he took his blood pressure medications he  brought with him. He was not agreeable with being in the room without his medications "in case that we messed it up". 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 He also mentioned that he called nursing for chest pain and nobody came, call that was never registered per nursing/front desk staff of medical floor.  Pt wanted to leave AMA and ask for papers to sign regardless nursing, head of nursing and myself trying to advice to stay for full work up of his pain.   Patient condition at time of discharge/disposition:  Patient leaves against medical advice.   D. Piloto Rolene Arbour, MD  [facility] 02/29/2012

## 2012-03-04 ENCOUNTER — Other Ambulatory Visit: Payer: Self-pay

## 2012-03-04 MED ORDER — HYDRALAZINE HCL 25 MG PO TABS: 50.0000 mg | ORAL_TABLET | Freq: Three times a day (TID) | ORAL | Status: DC

## 2012-03-06 ENCOUNTER — Encounter: Payer: Self-pay | Admitting: Internal Medicine

## 2012-03-07 ENCOUNTER — Other Ambulatory Visit (HOSPITAL_COMMUNITY): Payer: Self-pay | Admitting: Anesthesiology

## 2012-03-07 MED ORDER — HYDRALAZINE HCL 25 MG PO TABS: 25.0000 mg | ORAL_TABLET | Freq: Three times a day (TID) | ORAL | Status: DC

## 2012-03-07 NOTE — Telephone Encounter (Signed)
Needs refill of hydralazine

## 2012-03-14 ENCOUNTER — Other Ambulatory Visit (HOSPITAL_COMMUNITY): Payer: Self-pay | Admitting: *Deleted

## 2012-03-14 MED ORDER — HYDRALAZINE HCL 25 MG PO TABS: 25.0000 mg | ORAL_TABLET | Freq: Three times a day (TID) | ORAL | Status: DC

## 2012-04-02 ENCOUNTER — Ambulatory Visit (HOSPITAL_COMMUNITY)
Admission: RE | Admit: 2012-04-02 | Discharge: 2012-04-02 | Disposition: A | Discharge status: Home / Self Care | Payer: Medicaid Other | Source: Ambulatory Visit | Attending: Physician Assistant | Admitting: Physician Assistant

## 2012-04-02 VITALS — BP 179/92 | HR 52 | Wt 188.2 lb

## 2012-04-02 DIAGNOSIS — I10 Essential (primary) hypertension: Secondary | ICD-10-CM

## 2012-04-02 MED ORDER — HYDRALAZINE HCL 50 MG PO TABS: 50.0000 mg | ORAL_TABLET | Freq: Three times a day (TID) | ORAL | Status: DC

## 2012-04-02 NOTE — Patient Instructions (Signed)
Increase hydralazine 50 mg (2 tablets of the 25 mg) three times a day.  If blood pressure does not come back down to 130-140 range in 3 days increase to 75 mg three times a day.

## 2012-04-02 NOTE — Progress Notes (Signed)
History of Present Illness: Primary Cardiologist:  Dr. Arvilla Meres  Chad [name] is a 51 y.o. male with history of severe HTN, coronary artery disease status post previous myocardial infarction and bypass surgery in 2006 with DES to native PDA in 2011.  He also has a history congestive heart failure secondary to ischemic cardiomyopathy with EF in 30-35% range.  He is s/p single chamber ICD.  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 He underwent emergent repair of proximal aorta and reimplantation of his CABG grafts.  Carotids u/s in 2011 0-39% bilaterally with highly vascular lesion in left thyroid lobe.  Biopsy showed Hurthle cell lesion.  Saw [name] who is planning sub-total thyroidectomy on 6/26.   ETT/Myoview in 9/12: Walked 8:46 on [place]. Moderate-sized fixed inferior perfusion defect consistent with prior MI. EF 31% with septal hypokinesis. There was also inferior hypokinesis. Similar to prior study.   Here for a work in visit because of high blood pressure.  He feels ok.  Last visit his norvasc was weaned.  He is taking carvedilol 25 mg BID. He has noted increasing BP over the last week with sustained SBP 150-170s the last 2 days.  He has increased his hydralazine to 25 mg QID the last 2 days.  He hasn't noted much change in BP.  He denies dizziness.  No blurred vision.  No syncope.  Breathing ok.  Weight stable.  No orthopnea/PND/edema.  He had a tinge of chest pain but this was minimal and has resolved.  He is extremely anxious about his high blood pressure.    ROS: All pertinent positives and negatives as in HPI, otherwise negative.    Past Medical History  Diagnosis Date  . CAD (coronary artery disease)     a. s/p CABG 2006;  b. DES to PDA 2011 (cath: Dx not seen, dRCA/PDA tx with DES; S-PDA occluded (culprit), S-Dx occluded, S-RI and OM ok, L-LAD ok  . HTN (hypertension)     severe  . Chronic systolic heart failure     a.  echo 8/11: EF 35%, mod LVH, Grade 1 diast dysfxn, mild AI, mild MR, inf and post HK  . 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 CRI (chronic renal insufficiency)   . Thyroid cancer     Hertle Cell  . HLD (hyperlipidemia)   . Anemia   . Gout   . AICD (automatic cardioverter/defibrillator) present   . Carotid stenosis     dopplers 2011: 0-39% bilat.  . Chest pain syndrome     Current Outpatient Prescriptions  Medication Sig Dispense Refill  . amoxicillin (AMOXIL) 500 MG capsule Take 4 caps 1 hour before dental appointment.      [name] aspirin 81 MG tablet Take 2 tabs daily      . atorvastatin (LIPITOR) 40 MG tablet Take 20 mg by mouth once a week.       . calcium carbonate (TUMS EX) 750 MG chewable tablet Chew 1 tablet by mouth as needed.      . carvedilol (COREG) 25 MG tablet Take 25 mg by mouth 2 (two) times daily with a meal.      . digoxin (LANOXIN) 0.25 MG tablet Take 250 mcg by mouth daily.       [name] docusate sodium (COLACE) 100 MG capsule Take 100 mg by mouth as needed.      . furosemide (LASIX)  80 MG tablet Take 1 tablet (80 mg total) by mouth as needed.  30 tablet  11  . hydrALAZINE (APRESOLINE) 25 MG tablet Take 1 tablet (25 mg total) by mouth 3 (three) times daily.  90 tablet  6  . isosorbide mononitrate (IMDUR) 30 MG 24 hr tablet Take 30 mg by mouth 2 (two) times daily.      [name] lisinopril (PRINIVIL,ZESTRIL) 20 MG tablet Take 1 tablet (20 mg total) by mouth daily.  30 tablet  6  . nitroGLYCERIN (NITROSTAT) 0.4 MG SL tablet Place 1 tablet (0.4 mg total) under the tongue every 5 (five) minutes as needed.  25 tablet  6  . Omega-3 Fatty Acids (EQL FISH OIL) 1000 MG CAPS Take 2 capsules (2,000 mg total) by mouth daily.  90 each  0  . oxycodone (OXYCONTIN) 30 MG TB12 Take 30 mg by mouth every 8 (eight) hours.       [name] PLAVIX 75 MG tablet TAKE ONE TABLET BY MOUTH EVERY DAY WITH FOOD  30 each  6  . potassium chloride SA (K-DUR,KLOR-CON) 20 MEQ tablet Take 20 mEq by  mouth as needed. Takes 2 tabs a week      . vardenafil (LEVITRA) 20 MG tablet Take 20 mg by mouth daily as needed. For Erectile Dysfunction      . DISCONTD: carvedilol (COREG) 25 MG tablet Take 25 mg in AM and 12.5 mg (1/2 tab) in the PM      . DISCONTD: isosorbide mononitrate (IMDUR) 30 MG 24 hr tablet Take 2 tablets (60 mg total) by mouth daily.  60 tablet  6  . HYDROcodone-acetaminophen (NORCO) 10-325 MG per tablet Take 1 tablet by mouth every 6 (six) hours as needed for pain.  4 tablet  0  . vardenafil (LEVITRA) 20 MG tablet Take 1 tablet (20 mg total) by mouth daily as needed for erectile dysfunction.  10 tablet  3    Allergies  Allergen Reactions  . Lipitor (Atorvastatin Calcium) Anaphylaxis    Large doses  . Sulfonamide Derivatives Shortness Of Breath  . Iohexol      Desc: PT HAS ANAPHYLAXIS WITH CONTRAST MEDIA!, Onset Date: [id number]   Code: SOB, Desc: ok w/ 13hr prep//a.c., Onset Date: 47829562   . Latex Other (See Comments)    With long periods of exposure REACTION: Reaction not known  . Zocor (Simvastatin)     Vital Signs: BP 179/92  Pulse 52  Wt 188 lb 4 oz (85.39 kg)  SpO2 98%  PHYSICAL EXAM: Well nourished, well developed, in no acute distress HEENT: normal Neck: no JVD Cardiac:  normal S1, S2; RRR; 2/6 SEM RSB Lungs:  clear to auscultation bilaterally, no wheezing, rhonchi or rales Abd: soft, nontender, no hepatomegaly Ext: no edema Skin: warm and dry Neuro:  CNs 2-12 intact, no focal abnormalities noted    ASSESSMENT AND PLAN:

## 2012-04-02 NOTE — Assessment & Plan Note (Addendum)
Blood pressure elevated today with sustained SBP 150-170s over the last 48 hours.  Will increase hydralazine 50 mg TID with instructions to further titrate to 75 mg TID in the next 3 days if SBP remains above 150.  The patient voices understanding.  He will call back if he does not notice changes.  Discussed changes with Dr. Gala Romney, he agrees with above.

## 2012-04-16 ENCOUNTER — Ambulatory Visit (INDEPENDENT_AMBULATORY_CARE_PROVIDER_SITE_OTHER): Payer: Medicaid Other | Admitting: Family Medicine

## 2012-04-16 ENCOUNTER — Encounter: Payer: Self-pay | Admitting: Family Medicine

## 2012-04-16 ENCOUNTER — Ambulatory Visit (HOSPITAL_COMMUNITY)
Admission: RE | Admit: 2012-04-16 | Discharge: 2012-04-16 | Disposition: A | Discharge status: Home / Self Care | Payer: Medicaid Other | Source: Ambulatory Visit | Attending: Family Medicine | Admitting: Family Medicine

## 2012-04-16 VITALS — BP 136/72 | HR 78

## 2012-04-16 DIAGNOSIS — R9431 Abnormal electrocardiogram [ECG] [EKG]: Secondary | ICD-10-CM | POA: Insufficient documentation

## 2012-04-16 DIAGNOSIS — I10 Essential (primary) hypertension: Secondary | ICD-10-CM

## 2012-04-16 LAB — COMPREHENSIVE METABOLIC PANEL
ALT: 11 U/L (ref 0–53)
AST: 13 U/L (ref 0–37)
Albumin: 4.7 g/dL (ref 3.5–5.2)
Alkaline Phosphatase: 57 U/L (ref 39–117)
Potassium: 3.9 mEq/L (ref 3.5–5.3)
Sodium: 141 mEq/L (ref 135–145)
Total Protein: 7.3 g/dL (ref 6.0–8.3)

## 2012-04-16 MED ORDER — HYDRALAZINE HCL 25 MG PO TABS: 75.0000 mg | ORAL_TABLET | Freq: Three times a day (TID) | ORAL | Status: DC

## 2012-04-16 NOTE — Assessment & Plan Note (Signed)
Discussed with patient importance of medication compliance at length, especially the use of the medications as recommended instead of adding on additional doses randomly. We'll plan to increase hydralazine to 75 mg 3 times a day with plan followup with PCP next week. Will also check creatinine and potassium today. Discussed cardiovascular red flags at length chest pain, shortness of breath, nausea, diaphoresis. Patient expressed understanding of this in depth.

## 2012-04-16 NOTE — Progress Notes (Signed)
S:  Pt presents today as an acute work in visit for HTN. Pt has a baseline hx/o CAD s/p CABG 2006, CHF w/ EF 35%, CRI, and aortic dissection.  Patient states that he's noticed worsening pressures over the last month home. Patient states checks his pressures up to 8 times a day he knows his pressures have remained consistently high into the 170s at home. Patient is on a rather extensive medical regimen given his past medical history including Coreg, hydralazine, imdur, lisinopril. Patient denies any chest pain or shortness of breath however has had some mild headache. Patient states he's been taking random extra doses of his hypertension medication whenever his blood pressures get too high. Patient states this has been poorly effective in managing his blood pressures.      O:  Current Outpatient Prescriptions  Medication Sig Dispense Refill  . aspirin 81 MG tablet Take 2 tabs daily      . atorvastatin (LIPITOR) 40 MG tablet Take 20 mg by mouth once a week.       . calcium carbonate (TUMS EX) 750 MG chewable tablet Chew 1 tablet by mouth as needed.      . carvedilol (COREG) 25 MG tablet Take 25 mg by mouth 2 (two) times daily with a meal.      . digoxin (LANOXIN) 0.25 MG tablet Take 250 mcg by mouth daily.       [name] docusate sodium (COLACE) 100 MG capsule Take 100 mg by mouth as needed.      . furosemide (LASIX) 80 MG tablet Take 1 tablet (80 mg total) by mouth as needed.  30 tablet  11  . hydrALAZINE (APRESOLINE) 25 MG tablet Take 3 tablets (75 mg total) by mouth 3 (three) times daily.  240 tablet  6  . isosorbide mononitrate (IMDUR) 30 MG 24 hr tablet Take 30 mg by mouth 2 (two) times daily.      [name] lisinopril (PRINIVIL,ZESTRIL) 20 MG tablet Take 1 tablet (20 mg total) by mouth daily.  30 tablet  6  . nitroGLYCERIN (NITROSTAT) 0.4 MG SL tablet Place 1 tablet (0.4 mg total) under the tongue every 5 (five) minutes as needed.  25 tablet  6  . Omega-3 Fatty Acids (EQL FISH OIL) 1000 MG CAPS Take 2  capsules (2,000 mg total) by mouth daily.  90 each  0  . oxycodone (OXYCONTIN) 30 MG TB12 Take 30 mg by mouth every 8 (eight) hours.       [name] PLAVIX 75 MG tablet TAKE ONE TABLET BY MOUTH EVERY DAY WITH FOOD  30 each  6  . potassium chloride SA (K-DUR,KLOR-CON) 20 MEQ tablet Take 20 mEq by mouth as needed. Takes 2 tabs a week      . vardenafil (LEVITRA) 20 MG tablet Take 20 mg by mouth daily as needed. For Erectile Dysfunction      . DISCONTD: hydrALAZINE (APRESOLINE) 25 MG tablet Take 3 tablets (75 mg total) by mouth 3 (three) times daily.  200 tablet  6  . DISCONTD: hydrALAZINE (APRESOLINE) 50 MG tablet Take 1 tablet (50 mg total) by mouth 3 (three) times daily.  90 tablet  6  . amoxicillin (AMOXIL) 500 MG capsule Take 4 caps 1 hour before dental appointment.      [name] HYDROcodone-acetaminophen (NORCO) 10-325 MG per tablet Take 1 tablet by mouth every 6 (six) hours as needed for pain.  4 tablet  0  . vardenafil (LEVITRA) 20 MG tablet Take 1 tablet (20 mg  total) by mouth daily as needed for erectile dysfunction.  10 tablet  3    Wt Readings from Last 3 Encounters:  04/02/12 188 lb 4 oz (85.39 kg)  02/29/12 188 lb 0.8 oz (85.3 kg)  02/19/12 188 lb 8 oz (85.503 kg)   Temp Readings from Last 3 Encounters:  02/29/12 97.8 F (36.6 C) Oral  12/05/11 97.7 F (36.5 C) Oral  12/04/11 98.1 F (36.7 C)    BP Readings from Last 3 Encounters:  04/16/12 136/72  04/02/12 179/92  02/29/12 122/73   Pulse Readings from Last 3 Encounters:  04/16/12 78  04/02/12 52  02/29/12 59     General: alert and cooperative HEENT: PERRLA and extra ocular movement intact Heart: S1, S2 normal, no murmur, rub or gallop, regular rate and rhythm Lungs: clear to auscultation, no wheezes or rales and unlabored breathing Abdomen: abdomen is soft without significant tenderness, masses, organomegaly or guarding Extremities: extremities normal, atraumatic, no cyanosis or edema Skin:no rashes Neurology: normal without  focal findings, mental status, speech normal, alert and oriented x3, PERLA and reflexes normal and symmetric   A/P:    Addendum: At the end of the visit patient reports a mild right neck and right chest discomfort that acutely occurred last approximately 1-2 minutes and self resolved. Patient describes discomfort as very mild pressure without radiation and no associated nausea diaphoresis. Chest discomfort was not similar to previous episodes of aortic dissection or previous episodes of chest pain. An EKG was obtained showing normal sinus rhythm as well as T-wave inversions in the lateral leads which are unchanged from his previous EKG. Overall case was discussed with Dr. Deirdre Priest at length. Patient is known to have multiple cardiovascular risk factors however chest pain is highly atypical. Discussed with patient cardiovascular red flags at length. Patient's resting understanding of this. Plan followup with PCP in one week.

## 2012-04-16 NOTE — Patient Instructions (Signed)

## 2012-04-20 IMAGING — CR DG CHEST 2V
2 series · 2 of 2 positions shown · non-contrast
Comparison: 06/27/2011

CLINICAL DATA: Chest pain.  Coronary artery disease.  Thoracic
aortic dissection.

CHEST - 2 VIEW

[w chest pa]
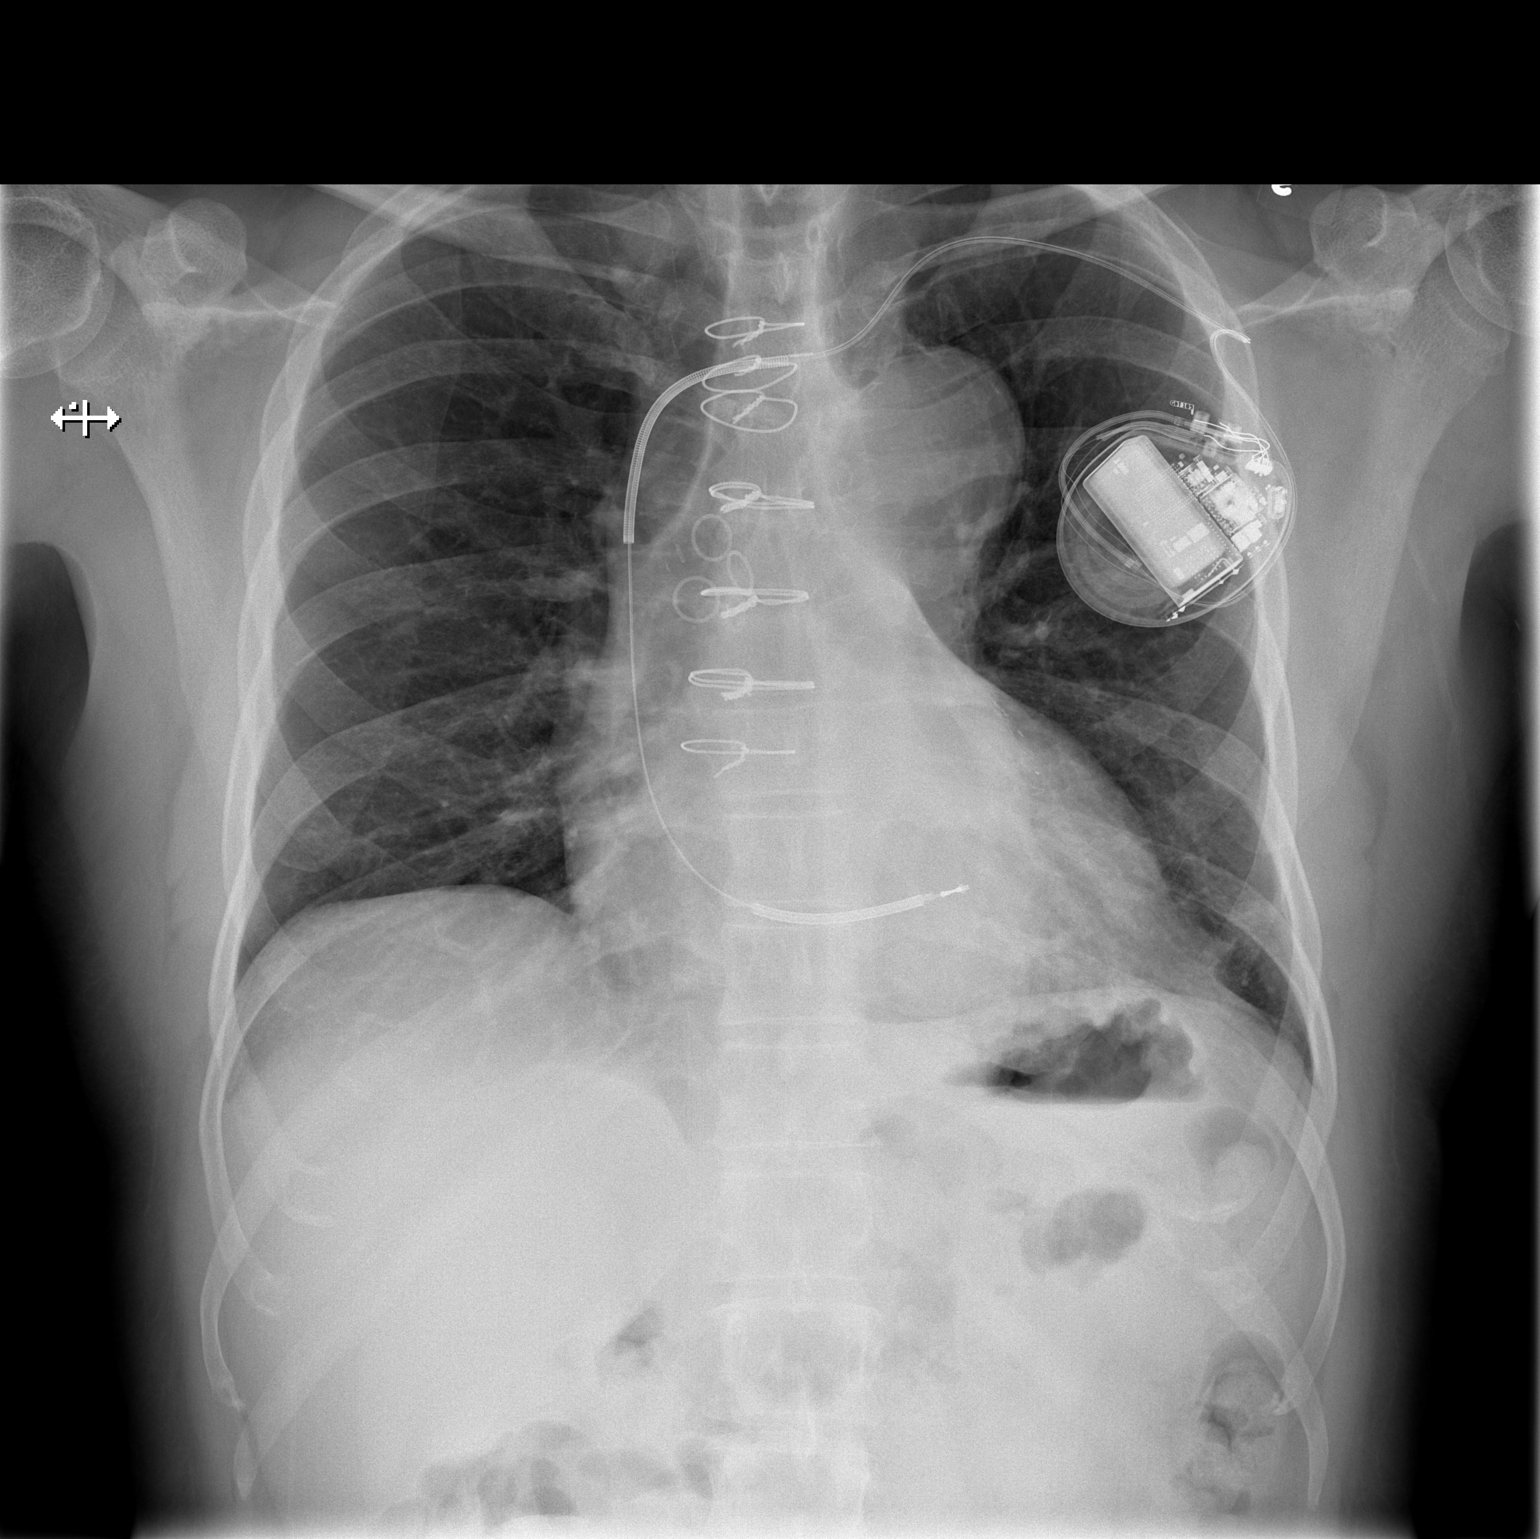

[w chest lat]
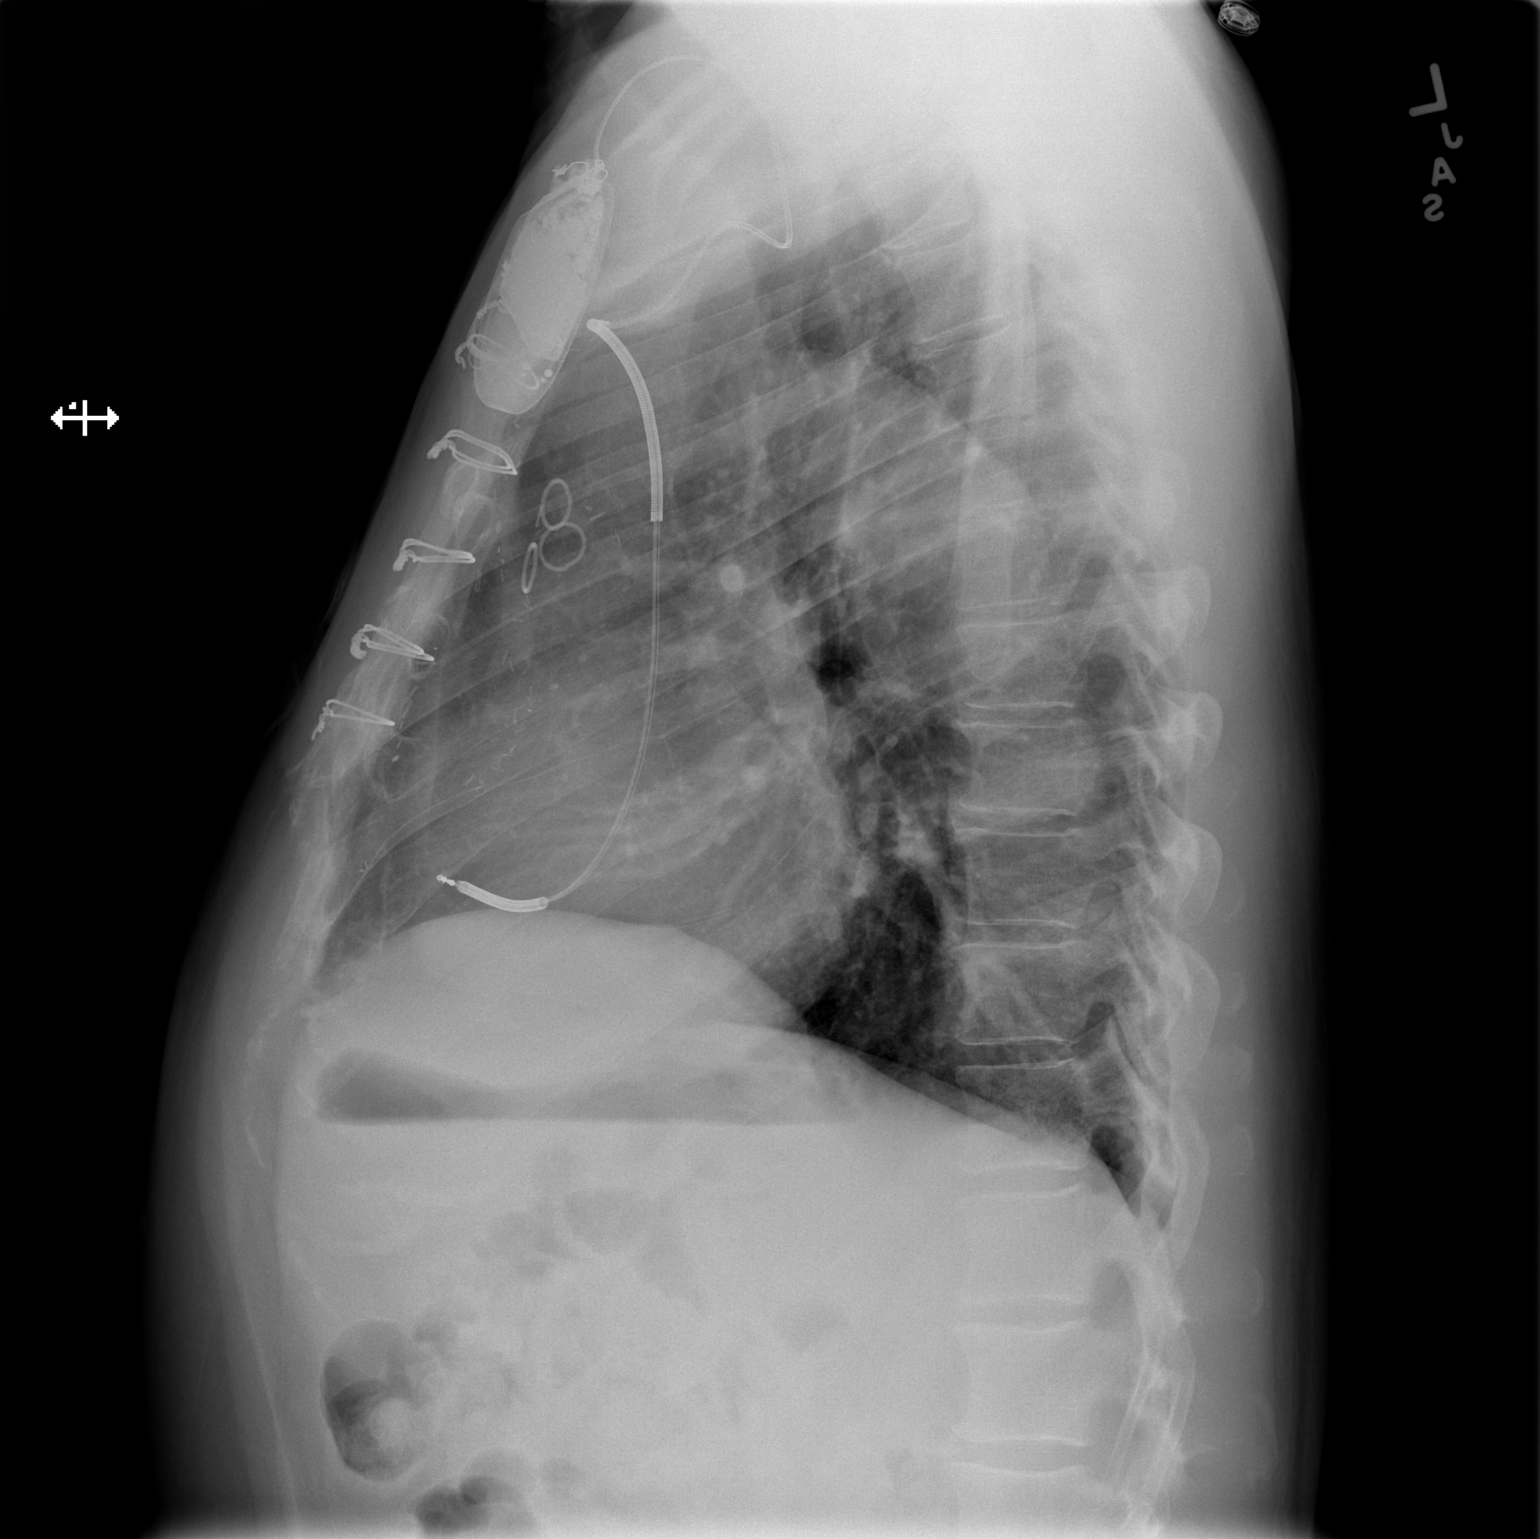

[2 of 2 positions shown; findings below may reference images not displayed]

FINDINGS: AICD remains in place.  Mild cardiomegaly stable.
Ectasia of the thoracic aorta is also unchanged.

There is no evidence of pulmonary infiltrate or edema.  No evidence
of pleural effusion.  Previous CABG again noted.
IMPRESSION: Stable mild cardiomegaly and ectatic thoracic aorta.  No active
lung disease.

## 2012-04-23 ENCOUNTER — Ambulatory Visit (HOSPITAL_COMMUNITY)
Admission: RE | Admit: 2012-04-23 | Discharge: 2012-04-23 | Disposition: A | Discharge status: Home / Self Care | Payer: Medicaid Other | Source: Ambulatory Visit | Attending: Internal Medicine | Admitting: Internal Medicine

## 2012-04-23 VITALS — BP 178/100 | HR 70 | Wt 188.8 lb

## 2012-04-23 DIAGNOSIS — I10 Essential (primary) hypertension: Secondary | ICD-10-CM | POA: Insufficient documentation

## 2012-04-23 DIAGNOSIS — I2510 Atherosclerotic heart disease of native coronary artery without angina pectoris: Secondary | ICD-10-CM | POA: Insufficient documentation

## 2012-04-23 DIAGNOSIS — M545 Low back pain, unspecified: Secondary | ICD-10-CM | POA: Insufficient documentation

## 2012-04-23 DIAGNOSIS — I5022 Chronic systolic (congestive) heart failure: Secondary | ICD-10-CM | POA: Insufficient documentation

## 2012-04-23 NOTE — Assessment & Plan Note (Signed)
BP up here in setting of pain but reportedly controlled at home. Stressed importance of keeping SBP < 140. He will continue to check frequently and call me if ir goes up.

## 2012-04-23 NOTE — Assessment & Plan Note (Signed)
Doing well. No overt HF. Continue current meds.

## 2012-04-23 NOTE — Progress Notes (Signed)
History of Present Illness: Primary Cardiologist:  Dr. Arvilla Meres  Chad [name] is a 51 y.o. male with history of severe HTN, coronary artery disease status post previous myocardial infarction and bypass surgery in 2006 with DES to native PDA in 2011.  He also has a history congestive heart failure secondary to ischemic cardiomyopathy with EF in 30-35% range.  He is s/p single chamber ICD.  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 He underwent emergent repair of proximal aorta and reimplantation of his CABG grafts.  Carotids u/s in 2011 0-39%.  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 Also with significant low back pain s/p 2 surgeries  ETT/Myoview in 9/12: Walked 8:46 on [place]. Moderate-sized fixed inferior perfusion defect consistent with prior MI. EF 31% with septal hypokinesis. There was also inferior hypokinesis. Similar to prior study.   Saw FP last week for HTN. SBP in 170s. Hydralazine increased to 75 tid. BP now much better at home typically 125-140. Taking extra hydralazine as needed. Over past few months LBP has been progressing. Now radiating down both legs R > L. Called Dr. Barnet Pall a while back and was told not a candidate for re-do procedure. Referred to pain clinic.   Occasional mild chest pain.  Breathing ok.  Weight stable.  No orthopnea/PND/edema.  He had a tinge of chest pain but this was minimal and has resolved.   ROS: All pertinent positives and negatives as in HPI, otherwise negative.    Past Medical History  Diagnosis Date  . CAD (coronary artery disease)     a. s/p CABG 2006;  b. DES to PDA 2011 (cath: Dx not seen, dRCA/PDA tx with DES; S-PDA occluded (culprit), S-Dx occluded, S-RI and OM ok, L-LAD ok  . HTN (hypertension)     severe  . Chronic systolic heart failure     a. echo 8/11: EF 35%, mod LVH, Grade 1 diast dysfxn, mild AI, mild MR, inf and post HK  . Aortic dissection, thoracoabdominal    7/10: Type I s/p repair  . CRI (chronic renal insufficiency)   . Thyroid cancer     Hertle Cell  . HLD (hyperlipidemia)   . Anemia   . Gout   . AICD (automatic cardioverter/defibrillator) present   . Carotid stenosis     dopplers 2011: 0-39% bilat.  . Chest pain syndrome     Current Outpatient Prescriptions  Medication Sig Dispense Refill  . amoxicillin (AMOXIL) 500 MG capsule Take 4 caps 1 hour before dental appointment.      [name] aspirin 81 MG tablet Take 2 tabs daily      . atorvastatin (LIPITOR) 40 MG tablet Take 20 mg by mouth once a week.       . calcium carbonate (TUMS EX) 750 MG chewable tablet Chew 1 tablet by mouth as needed.      . carvedilol (COREG) 25 MG tablet Take 25 mg by mouth 2 (two) times daily with a meal.      . digoxin (LANOXIN) 0.25 MG tablet Take 250 mcg by mouth daily.       [name] docusate sodium (COLACE) 100 MG capsule Take 100 mg by mouth as needed.      . furosemide (LASIX) 80 MG tablet Take 1 tablet (80 mg total) by mouth as needed.  30 tablet  11  . hydrALAZINE (APRESOLINE) 25 MG tablet Take 3 tablets (75 mg total) by mouth 3 (three)  times daily.  240 tablet  6  . isosorbide mononitrate (IMDUR) 30 MG 24 hr tablet Take 30 mg by mouth 2 (two) times daily.      [name] lisinopril (PRINIVIL,ZESTRIL) 20 MG tablet Take 1 tablet (20 mg total) by mouth daily.  30 tablet  6  . loratadine (CLARITIN) 10 MG tablet Take 10 mg by mouth daily.      . nitroGLYCERIN (NITROSTAT) 0.4 MG SL tablet Place 1 tablet (0.4 mg total) under the tongue every 5 (five) minutes as needed.  25 tablet  6  . Omega-3 Fatty Acids (EQL FISH OIL) 1000 MG CAPS Take 2 capsules (2,000 mg total) by mouth daily.  90 each  0  . oxycodone (OXYCONTIN) 30 MG TB12 Take 30 mg by mouth every 8 (eight) hours.       [name] PLAVIX 75 MG tablet TAKE ONE TABLET BY MOUTH EVERY DAY WITH FOOD  30 each  6  . potassium chloride SA (K-DUR,KLOR-CON) 20 MEQ tablet Take 20 mEq by mouth as needed. Takes 2 tabs a week      . vardenafil  (LEVITRA) 20 MG tablet Take 20 mg by mouth daily as needed. For Erectile Dysfunction      . HYDROcodone-acetaminophen (NORCO) 10-325 MG per tablet Take 1 tablet by mouth every 6 (six) hours as needed for pain.  4 tablet  0  . vardenafil (LEVITRA) 20 MG tablet Take 1 tablet (20 mg total) by mouth daily as needed for erectile dysfunction.  10 tablet  3    Allergies  Allergen Reactions  . Lipitor (Atorvastatin Calcium) Anaphylaxis    Large doses  . Sulfonamide Derivatives Shortness Of Breath  . Iohexol      Desc: PT HAS ANAPHYLAXIS WITH CONTRAST MEDIA!, Onset Date: 08657846   Code: SOB, Desc: ok w/ 13hr prep//a.c., Onset Date: 96295284   . Latex Other (See Comments)    With long periods of exposure REACTION: Reaction not known  . Zocor (Simvastatin)     Vital Signs: BP 178/100  Pulse 70  Wt 188 lb 12 oz (85.616 kg)  SpO2 98%  PHYSICAL EXAM: Uncomfortable due to back pain. No resp distress HEENT: normal Neck: JVP 8 Cardiac:  normal S1, S2; RRR; 2/6 SEM RSB + s4 Lungs:  clear to auscultation bilaterally, no wheezing, rhonchi or rales Abd: soft, nontender, no hepatomegaly Ext: no edema Skin: warm and dry Neuro:  CNs 2-12 intact, no focal abnormalities noted. Full strength in BLE. Mild tenderness to palpation over paraspinal muscles    ASSESSMENT AND PLAN:

## 2012-04-23 NOTE — Assessment & Plan Note (Signed)
Referred to [facility] for 2nd opinion at his request.

## 2012-04-23 NOTE — Assessment & Plan Note (Signed)
No evidence of ischemia. Continue current regimen.   

## 2012-04-25 ENCOUNTER — Other Ambulatory Visit: Payer: Self-pay | Admitting: Cardiovascular Disease

## 2012-04-25 ENCOUNTER — Other Ambulatory Visit: Payer: Self-pay

## 2012-04-25 MED ORDER — CLOPIDOGREL BISULFATE 75 MG PO TABS: 75.0000 mg | ORAL_TABLET | Freq: Every day | ORAL | Status: DC

## 2012-05-10 ENCOUNTER — Emergency Department (HOSPITAL_COMMUNITY): Payer: Medicaid Other

## 2012-05-10 ENCOUNTER — Emergency Department (HOSPITAL_COMMUNITY)
Admission: EM | Admit: 2012-05-10 | Discharge: 2012-05-11 | Disposition: A | Discharge status: Home / Self Care | Payer: Medicaid Other | Attending: Emergency Medicine | Admitting: Emergency Medicine

## 2012-05-10 DIAGNOSIS — Z79899 Other long term (current) drug therapy: Secondary | ICD-10-CM | POA: Insufficient documentation

## 2012-05-10 DIAGNOSIS — Z85850 Personal history of malignant neoplasm of thyroid: Secondary | ICD-10-CM | POA: Insufficient documentation

## 2012-05-10 DIAGNOSIS — I10 Essential (primary) hypertension: Secondary | ICD-10-CM | POA: Insufficient documentation

## 2012-05-10 DIAGNOSIS — E785 Hyperlipidemia, unspecified: Secondary | ICD-10-CM | POA: Insufficient documentation

## 2012-05-10 DIAGNOSIS — M79609 Pain in unspecified limb: Secondary | ICD-10-CM | POA: Insufficient documentation

## 2012-05-10 DIAGNOSIS — Z8639 Personal history of other endocrine, nutritional and metabolic disease: Secondary | ICD-10-CM | POA: Insufficient documentation

## 2012-05-10 DIAGNOSIS — Z862 Personal history of diseases of the blood and blood-forming organs and certain disorders involving the immune mechanism: Secondary | ICD-10-CM | POA: Insufficient documentation

## 2012-05-10 DIAGNOSIS — I5022 Chronic systolic (congestive) heart failure: Secondary | ICD-10-CM | POA: Insufficient documentation

## 2012-05-10 DIAGNOSIS — R079 Chest pain, unspecified: Secondary | ICD-10-CM | POA: Insufficient documentation

## 2012-05-10 DIAGNOSIS — I2510 Atherosclerotic heart disease of native coronary artery without angina pectoris: Secondary | ICD-10-CM | POA: Insufficient documentation

## 2012-05-10 DIAGNOSIS — R0609 Other forms of dyspnea: Secondary | ICD-10-CM | POA: Insufficient documentation

## 2012-05-10 DIAGNOSIS — R0989 Other specified symptoms and signs involving the circulatory and respiratory systems: Secondary | ICD-10-CM | POA: Insufficient documentation

## 2012-05-10 DIAGNOSIS — Z7982 Long term (current) use of aspirin: Secondary | ICD-10-CM | POA: Insufficient documentation

## 2012-05-10 LAB — COMPREHENSIVE METABOLIC PANEL
ALT: 8 U/L (ref 0–53)
Calcium: 8.7 mg/dL (ref 8.4–10.5)
GFR calc Af Amer: 62 mL/min — ABNORMAL LOW (ref >90)
Glucose, Bld: 94 mg/dL (ref 70–99)
Sodium: 140 mEq/L (ref 135–145)
Total Protein: 6.7 g/dL (ref 6.0–8.3)

## 2012-05-10 LAB — LIPASE, BLOOD: Lipase: 24 U/L (ref 11–59)

## 2012-05-10 LAB — DIFFERENTIAL
Basophils Relative: 0 % (ref 0–1)
Eosinophils Absolute: 0.4 10*3/uL (ref 0.0–0.7)
Monocytes Absolute: 0.2 10*3/uL (ref 0.1–1.0)
Neutrophils Relative %: 24 % — ABNORMAL LOW (ref 43–77)

## 2012-05-10 LAB — TROPONIN I: Troponin I: <0.3 ng/mL (ref <0.30)

## 2012-05-10 LAB — CBC
Hemoglobin: 11.9 g/dL — ABNORMAL LOW (ref 13.0–17.0)
MCH: 26.7 pg (ref 26.0–34.0)
MCHC: 33 g/dL (ref 30.0–36.0)
Platelets: 136 10*3/uL — ABNORMAL LOW (ref 150–400)
RDW: 13.8 % (ref 11.5–15.5)

## 2012-05-10 LAB — PRO B NATRIURETIC PEPTIDE: Pro B Natriuretic peptide (BNP): 1055 pg/mL — ABNORMAL HIGH (ref 0–125)

## 2012-05-10 MED ORDER — FUROSEMIDE 10 MG/ML IJ SOLN
120.0000 mg | Freq: Once | INTRAVENOUS | Status: AC
  Administered 2012-05-11: 120 mg via INTRAVENOUS
  Filled 2012-05-10: qty 12

## 2012-05-10 NOTE — ED Provider Notes (Signed)
History     CSN: [id number]  Arrival date & time 05/10/12  1958   First MD Initiated Contact with Patient 05/10/12 2000      Chief Complaint  Patient presents with  . Chest Pain    HPI This patient with a notable history of CAD / CHF now presents with chest pain.  He notes that the discomfort began gradually approximately 3 hours prior to presentation, and became worse in the time just prior to arrival.  He notes the pain was anterior, pressure like, with radiation to both arms, greater on the left.  He also notes mild dyspnea.  The patient took his blood pressure found to be elevated, then took labetalol, aspirin, nitroglycerin.  On arrival the patient notes his pain is minimal.  He denies any current dyspnea, lightheadedness, syncope, nausea, vomiting, diarrhea.  He notes the pain is similar to that he has experienced before. Past Medical History  Diagnosis Date  . CAD (coronary artery disease)     a. s/p CABG 2006;  b. DES to PDA 2011 (cath: Dx not seen, dRCA/PDA tx with DES; S-PDA occluded (culprit), S-Dx occluded, S-RI and OM ok, L-LAD ok  . HTN (hypertension)     severe  . Chronic systolic heart failure     a. echo 8/11: EF 35%, mod LVH, Grade 1 diast dysfxn, mild AI, mild MR, inf and post HK  . 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 CRI (chronic renal insufficiency)   . Thyroid cancer     Hertle Cell  . HLD (hyperlipidemia)   . Anemia   . Gout   . AICD (automatic cardioverter/defibrillator) present   . Carotid stenosis     dopplers 2011: 0-39% bilat.  . Chest pain syndrome     Past Surgical History  Procedure Date  . Coronary artery bypass graft 06/21/2009  . Cardiac defibrillator placement     [employer]  . Thyroidectomy, partial 06/20/11  . Lumbar disc surgery   . Status post emergency repair of a type a ascending aortic dissection with a hemiarch reconstruction of the ascending aorta  using a 28-mm hemashield graft with redo  sternotomy and revision of previous bypass grafts in june 2010.   . Median sternotomy,cabg x 7 06/29/2005    Family History  Problem Relation Age of Onset  . Coronary artery disease    . Hypertension Father   . Heart disease Father   . Early death Father   . COPD Father   . Hypertension Mother     History  Substance Use Topics  . Smoking status: Never Smoker   . Smokeless tobacco: Never Used  . Alcohol Use: No      Review of Systems  Constitutional:       Per HPI, otherwise negative  HENT:       Per HPI, otherwise negative  Eyes: Negative.   Respiratory:       Per HPI, otherwise negative  Cardiovascular:       Per HPI, otherwise negative  Gastrointestinal: Negative for vomiting.  Genitourinary: Negative.   Musculoskeletal:       Per HPI, otherwise negative  Skin: Negative.   Neurological: Negative for syncope.    Allergies  Lipitor; Sulfonamide derivatives; Iohexol; Latex; and Zocor  Home Medications   Current Outpatient Rx  Name Route Sig Dispense Refill  . ASPIRIN 81 MG PO TABS  Take 2 tabs daily    . ATORVASTATIN CALCIUM  40 MG PO TABS Oral Take 20 mg by mouth daily.     [name] CALCIUM CARBONATE ANTACID 750 MG PO CHEW Oral Chew 1 tablet by mouth daily as needed. For heart burn    . CARVEDILOL 25 MG PO TABS Oral Take 25 mg by mouth 2 (two) times daily with a meal.    . CLOPIDOGREL BISULFATE 75 MG PO TABS Oral Take 1 tablet (75 mg total) by mouth daily. 30 tablet 2  . DIGOXIN 0.25 MG PO TABS Oral Take 250 mcg by mouth daily.     [name] DOCUSATE SODIUM 100 MG PO CAPS Oral Take 100 mg by mouth daily as needed. For constipation    . FUROSEMIDE 80 MG PO TABS Oral Take 80 mg by mouth daily as needed. For bloating    . HYDRALAZINE HCL 25 MG PO TABS Oral Take 3 tablets (75 mg total) by mouth 3 (three) times daily. 240 tablet 6  . ISOSORBIDE MONONITRATE ER 30 MG PO TB24 Oral Take 30 mg by mouth 2 (two) times daily.    [name] LISINOPRIL 20 MG PO TABS Oral Take 1 tablet (20 mg total)  by mouth daily. 30 tablet 6  . LORATADINE 10 MG PO TABS Oral Take 10 mg by mouth daily.    [name] NITROGLYCERIN 0.4 MG SL SUBL Sublingual Place 0.4 mg under the tongue every 5 (five) minutes as needed. For chest pain    . EQL FISH OIL 1000 MG PO CAPS Oral Take 2 capsules (2,000 mg total) by mouth daily. 90 each 0  . OXYCODONE HCL ER 30 MG PO TB12 Oral Take 30 mg by mouth every 8 (eight) hours.     [name] POTASSIUM CHLORIDE CRYS ER 20 MEQ PO TBCR Oral Take 20 mEq by mouth daily as needed. Take with Lasix    . VARDENAFIL HCL 20 MG PO TABS Oral Take 20 mg by mouth daily as needed. For Erectile Dysfunction    . HYDROCODONE-ACETAMINOPHEN 10-325 MG PO TABS Oral Take 1 tablet by mouth every 6 (six) hours as needed for pain. 4 tablet 0  . VARDENAFIL HCL 20 MG PO TABS Oral Take 1 tablet (20 mg total) by mouth daily as needed for erectile dysfunction. 10 tablet 3    BP 134/69  Pulse 69  Temp(Src) 98.3 F (36.8 C) (Oral)  Resp 18  SpO2 96%  Physical Exam  Nursing note and vitals reviewed. Constitutional: He is oriented to person, place, and time. He appears well-developed. No distress.  HENT:  Head: Normocephalic and atraumatic.  Eyes: Conjunctivae and EOM are normal.  Cardiovascular: Normal rate and regular rhythm.   Pulmonary/Chest: Effort normal. No stridor. No respiratory distress.  Abdominal: He exhibits no distension.  Musculoskeletal: He exhibits no edema.  Neurological: He is alert and oriented to person, place, and time.  Skin: Skin is warm and dry.  Psychiatric: He has a normal mood and affect.    ED Course  Procedures (including critical care time)  Labs Reviewed  CBC - Abnormal; Notable for the following:    WBC 2.9 (*)    Hemoglobin 11.9 (*)    HCT 36.1 (*)    Platelets 136 (*)    All other components within normal limits  DIFFERENTIAL - Abnormal; Notable for the following:    Neutrophils Relative 24 (*)    Lymphocytes Relative 55 (*)    Eosinophils Relative 13 (*)    Neutro  Abs 0.7 (*)    All other components within normal  limits  COMPREHENSIVE METABOLIC PANEL - Abnormal; Notable for the following:    Creatinine, Ser 1.48 (*)    GFR calc non Af Amer 54 (*)    GFR calc Af Amer 62 (*)    All other components within normal limits  LIPASE, BLOOD  TROPONIN I  TROPONIN I  PRO B NATRIURETIC PEPTIDE   Dg Chest 2 View  05/10/2012  *RADIOLOGY REPORT*  Clinical Data: Left-sided chest pain  CHEST - 2 VIEW  Comparison: 02/29/2012  Findings: Evidence of CABG noted.  Left AICD in place.  Moderate cardiomegaly noted with aortic ectasia, unchanged.  Curvilinear left lower lobe scarring or atelectasis.  No pleural effusion.  No acute osseous finding.  IMPRESSION: Cardiomegaly without focal acute finding.  Original Report Authenticated By: Harrel Lemon, M.D.     No diagnosis found.  Cardiac: 70sr- normal  Pulse ox: 99%ra-normal   Date: 05/10/2012  Rate: 68   Rhythm: normal sinus rhythm  QRS Axis: left  Intervals: normal  ST/T Wave abnormalities: nonspecific T wave changes  Conduction Disutrbances:none  Narrative Interpretation:   Old EKG Reviewed: unchanged  Abnormal  11:27 PM Patient resting, in no distress   11:42 PM Patient remains comfortable appearing. MDM  This patient with a history of CAD, as well as congestive heart failure now presents with chest discomfort that has resolved prior to presentation.  On my exam the patient is in no distress with unremarkable vital signs.  The patient's ECG is unchanged in nonischemic, his labs are most notable for no increase in serial troponins, but the demonstration of BNP elevation consistent with heart failure worsening.  The patient remained in no distress throughout the emergency department stay, and had no recurrence of chest pain.  Given these findings, the patient's close access to followup, he received IV Lasix and was discharged with explicit return precautions.        Gerhard Munch, MD 05/10/12  [phone]

## 2012-05-10 NOTE — ED Notes (Signed)
From home. Complains of substernal CP radiating to left arm x 2 hours. Constant dullness. Patient took his own ASA 324 mg and Nitro SL x 2-3 with little to no relief, prior to EMS arrival. Patient also took his own medication Labetalol 50 mg after fire rescue took his blood pressure which was 190/120. EMS gave patient Nitro x 1. EMS BP 149/103. EKG unremarkable. No SOB, diaphoresis, headache or dizziness.

## 2012-05-10 NOTE — ED Notes (Signed)
MD at bedside. 

## 2012-05-10 NOTE — Discharge Instructions (Signed)
Your evaluation today his reassuring for the low suspicion of ongoing cardiac ischemia.  It is extremely important, however, that you follow up with your cardiologist, do to your history.  Today's evaluation does demonstrate a mild worsening of your heart failure.  Please make sure to take her medication as directed.  If you develop any new, or concerning changes in your condition, please return to the emergency department immediately.

## 2012-05-10 NOTE — ED Notes (Signed)
Pharmacy notified for lasix infusion. Patient given [place] sandwhich, snack and drink

## 2012-05-10 NOTE — ED Notes (Signed)
Patient transported to X-ray 

## 2012-05-11 NOTE — ED Notes (Signed)
Patient discharge pending after lasix has infused

## 2012-05-11 NOTE — ED Notes (Signed)
RN found patient to be sorting through home medication bag. RN asked and encouraged patient to please wait to take his home medications after being discharged home. Patient insists that he takes his home medications. EDP notified. Patient took the following medications/doses:  ASA 162 mg Dig 0.25 mg  Plavix 75 mg Coreg 25 mg  Lisinopril 20 mg Hydralazine 75 mg  Imdur 30 mg   Oxycodone 30 mg

## 2012-05-13 LAB — PATHOLOGIST SMEAR REVIEW

## 2012-05-30 NOTE — Telephone Encounter (Signed)
Unable to document on this encounter due to access issues.  This request was forwarded to [facility] to add to the list./akw

## 2012-06-04 ENCOUNTER — Other Ambulatory Visit (HOSPITAL_COMMUNITY): Payer: Self-pay | Admitting: Internal Medicine

## 2012-06-04 NOTE — Telephone Encounter (Signed)
Pt needs appointment then refill can be made Fax Received. Refill Completed. Chad [name] (R.M.A)   

## 2012-06-26 ENCOUNTER — Other Ambulatory Visit: Payer: Self-pay | Admitting: Physician Assistant

## 2012-07-08 ENCOUNTER — Other Ambulatory Visit (HOSPITAL_COMMUNITY): Payer: Self-pay | Admitting: Internal Medicine

## 2012-07-08 ENCOUNTER — Other Ambulatory Visit: Payer: Self-pay | Admitting: Cardiovascular Disease

## 2012-07-08 ENCOUNTER — Other Ambulatory Visit: Payer: Self-pay | Admitting: Physician Assistant

## 2012-07-09 ENCOUNTER — Ambulatory Visit (INDEPENDENT_AMBULATORY_CARE_PROVIDER_SITE_OTHER): Payer: Medicaid Other | Admitting: Internal Medicine

## 2012-07-09 ENCOUNTER — Encounter: Payer: Self-pay | Admitting: Internal Medicine

## 2012-07-09 VITALS — BP 148/81 | HR 76 | Wt 181.0 lb

## 2012-07-09 DIAGNOSIS — I498 Other specified cardiac arrhythmias: Secondary | ICD-10-CM

## 2012-07-09 DIAGNOSIS — Z95810 Presence of automatic (implantable) cardiac defibrillator: Secondary | ICD-10-CM

## 2012-07-09 DIAGNOSIS — I5022 Chronic systolic (congestive) heart failure: Secondary | ICD-10-CM

## 2012-07-09 DIAGNOSIS — R001 Bradycardia, unspecified: Secondary | ICD-10-CM

## 2012-07-09 LAB — ICD DEVICE OBSERVATION
CHARGE TIME: 12.3 s
HV IMPEDENCE: 45 Ohm
RV LEAD AMPLITUDE: 11 mv
RV LEAD IMPEDENCE ICD: 489 Ohm
RV LEAD THRESHOLD: 0.6 V
TZAT-0001FASTVT: 1
TZAT-0001SLOWVT: 2
TZAT-0002FASTVT: NEGATIVE
TZAT-0005FASTVT: 81 pct
TZAT-0012SLOWVT: 200 ms
TZAT-0013SLOWVT: 1
TZAT-0013SLOWVT: 1
TZAT-0018FASTVT: NEGATIVE
TZAT-0018SLOWVT: NEGATIVE
TZAT-0019FASTVT: 7.5 V
TZAT-0019SLOWVT: 7.5 V
TZAT-0019SLOWVT: 7.5 V
TZAT-0020FASTVT: 1 ms
TZAT-0020SLOWVT: 1 ms
TZAT-0020SLOWVT: 1 ms
TZON-0003FASTVT: 285 ms
TZON-0003SLOWVT: 325 ms
TZON-0004FASTVT: 2.5
TZON-0004SLOWVT: 2.5
TZON-0005FASTVT: 1
TZON-0005SLOWVT: 1
TZST-0001FASTVT: 4
TZST-0001FASTVT: 6
TZST-0001SLOWVT: 3
TZST-0001SLOWVT: 5
TZST-0001SLOWVT: 6
TZST-0001SLOWVT: 7
TZST-0003FASTVT: 31 J
TZST-0003FASTVT: 31 J
TZST-0003SLOWVT: 23 J
TZST-0003SLOWVT: 31 J
VENTRICULAR PACING ICD: 0 pct

## 2012-07-09 NOTE — Progress Notes (Signed)
HPI Chad [name] returns today for followup. He is a very pleasant 51 year old man with an ischemic cardiomyopathy and aortic dissection status post repair. He is chronic systolic heart failure which is now class I. In the interim he has been stable. He denies chest pain or shortness of breath. No ICD discharges. No peripheral edema. Allergies  Allergen Reactions  . Lipitor (Atorvastatin Calcium) Anaphylaxis    Large doses  . Sulfonamide Derivatives Shortness Of Breath  . Iohexol      Desc: PT HAS ANAPHYLAXIS WITH CONTRAST MEDIA!, Onset Date: 16109604   Code: SOB, Desc: ok w/ 13hr prep//a.c., Onset Date: 54098119   . Latex Other (See Comments)    With long periods of exposure REACTION: Reaction not known  . Zocor (Simvastatin) Other (See Comments)    unknown     Current Outpatient Prescriptions  Medication Sig Dispense Refill  . aspirin 81 MG tablet Take 2 tabs daily      . calcium carbonate (TUMS EX) 750 MG chewable tablet Chew 1 tablet by mouth daily as needed. For heart burn      . carvedilol (COREG) 25 MG tablet TAKE ONE TABLET BY MOUTH TWICE DAILY  60 tablet  9  . clopidogrel (PLAVIX) 75 MG tablet TAKE 1 TABLET (75 MG TOTAL) BY MOUTH DAILY.  30 tablet  9  . digoxin (LANOXIN) 0.25 MG tablet TAKE ONE TABLET BY MOUTH DAILY  30 tablet  9  . docusate sodium (COLACE) 100 MG capsule Take 100 mg by mouth daily as needed. For constipation      . furosemide (LASIX) 80 MG tablet Take 80 mg by mouth daily as needed. For bloating      . hydrALAZINE (APRESOLINE) 25 MG tablet Take 3 tablets (75 mg total) by mouth 3 (three) times daily.  240 tablet  6  . isosorbide mononitrate (IMDUR) 30 MG 24 hr tablet Take 30 mg by mouth 2 (two) times daily.      [name] lisinopril (PRINIVIL,ZESTRIL) 20 MG tablet TAKE ONE TABLET BY MOUTH DAILY  30 tablet  9  . loratadine (CLARITIN) 10 MG tablet Take 10 mg by mouth daily.      [name] NITROSTAT 0.4 MG SL tablet DISSOLVE 1 TABLET UNDER TONGUE EVERY 5 MINS AS NEEDED FOR  CHEST PAIN.DO NOT EXCEED 3 DOSES IN 15 MINS  25 tablet  2  . Omega-3 Fatty Acids (EQL FISH OIL) 1000 MG CAPS Take 2 capsules (2,000 mg total) by mouth daily.  90 each  0  . oxycodone (OXYCONTIN) 30 MG TB12 Take 30 mg by mouth every 8 (eight) hours.       . potassium chloride SA (K-DUR,KLOR-CON) 20 MEQ tablet TAKE ONE TABLET   BY MOUTH   DAILY   AS NEEDED   WITH LASIX  30 tablet  9  . vardenafil (LEVITRA) 20 MG tablet Take 20 mg by mouth daily as needed. For Erectile Dysfunction      . DISCONTD: vardenafil (LEVITRA) 20 MG tablet Take 1 tablet (20 mg total) by mouth daily as needed for erectile dysfunction.  10 tablet  3     Past Medical History  Diagnosis Date  . CAD (coronary artery disease)     a. s/p CABG 2006;  b. DES to PDA 2011 (cath: Dx not seen, dRCA/PDA tx with DES; S-PDA occluded (culprit), S-Dx occluded, S-RI and OM ok, L-LAD ok  . HTN (hypertension)     severe  . Chronic systolic heart failure  a. echo 8/11: EF 35%, mod LVH, Grade 1 diast dysfxn, mild AI, mild MR, inf and post HK  . 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 CRI (chronic renal insufficiency)   . Thyroid cancer     Hertle Cell  . HLD (hyperlipidemia)   . Anemia   . Gout   . AICD (automatic cardioverter/defibrillator) present   . Carotid stenosis     dopplers 2011: 0-39% bilat.  . Chest pain syndrome     ROS:   All systems reviewed and negative except as noted in the HPI.   Past Surgical History  Procedure Date  . Coronary artery bypass graft 06/21/2009  . Cardiac defibrillator placement     [employer]  . Thyroidectomy, partial 06/20/11  . Lumbar disc surgery   . Status post emergency repair of a type a ascending aortic dissection with a hemiarch reconstruction of the ascending aorta  using a 28-mm hemashield graft with redo sternotomy and revision of previous bypass grafts in june 2010.   . Median sternotomy,cabg x 7 06/29/2005     Family History  Problem  Relation Age of Onset  . Coronary artery disease    . Hypertension Father   . Heart disease Father   . Early death Father   . COPD Father   . Hypertension Mother      History   Social History  . Marital Status: Divorced    Spouse Name: N/A    Number of Children: N/A  . Years of Education: N/A   Occupational History  . disabled    Social History Main Topics  . Smoking status: Never Smoker   . Smokeless tobacco: Never Used  . Alcohol Use: No  . Drug Use: No  . Sexually Active: Yes   Other Topics Concern  . Not on file   Social History Narrative  . No narrative on file     BP 148/81  Pulse 76  Wt 181 lb (82.101 kg)  Physical Exam:  Well appearing middle-aged man, NAD HEENT: Unremarkable Neck:  7 cm JVD, no thyromegally Lungs:  Clear with no wheezes, rales, or rhonchi. HEART:  Regular rate rhythm, no murmurs, no rubs, no clicks Abd:  soft, positive bowel sounds, no organomegally, no rebound, no guarding Ext:  2 plus pulses, no edema, no cyanosis, no clubbing Skin:  No rashes no nodules Neuro:  CN II through XII intact, motor grossly intact  DEVICE  Normal device function.  See PaceArt for details.   Assess/Plan:

## 2012-07-09 NOTE — Patient Instructions (Addendum)
Your physician wants you to follow-up in: 12 months with Dr Court Joy will receive a reminder letter in the mail two months in advance. If you don't receive a letter, please call our office to schedule the follow-up appointment.   Remote monitoring is used to monitor your Pacemaker of ICD from home. This monitoring reduces the number of office visits required to check your device to one time per year. It allows [place] to keep an eye on the functioning of your device to ensure it is working properly. You are scheduled for a device check from home on 10/17/2012. You may send your transmission at any time that day. If you have a wireless device, the transmission will be sent automatically. After your physician reviews your transmission, you will receive a postcard with your next transmission date.

## 2012-07-09 NOTE — Assessment & Plan Note (Signed)
His current symptoms are class II. He will continue his current medical therapy and maintain a low-sodium diet. 

## 2012-07-09 NOTE — Assessment & Plan Note (Signed)
His device is working normally. He is approaching elective replacement. 

## 2012-07-17 ENCOUNTER — Telehealth (HOSPITAL_COMMUNITY): Payer: Self-pay | Admitting: *Deleted

## 2012-07-17 NOTE — Telephone Encounter (Signed)
Patient needs emergency appt - he said that he can not get his blood pressure down more than 1 hr, running 197/107.Please call patient today.  thanks

## 2012-07-17 NOTE — Telephone Encounter (Signed)
Spoke w/pt he states his BP had been running elevated for about a week, he does not feel symptomatic, no headaches, he is taking all of his meds and states after meds BP is better but then it becomes elevated again, will discuss w/Dr Bensimhon and call him tomorrow

## 2012-07-18 IMAGING — CR DG CHEST 2V
2 series · 2 of 2 positions shown · non-contrast
Comparison: 12/02/2011
Correlation:  CT chest 09/27/2011

CLINICAL DATA: Epigastric pain, shortness of breath

CHEST - 2 VIEW

[w chest pa]
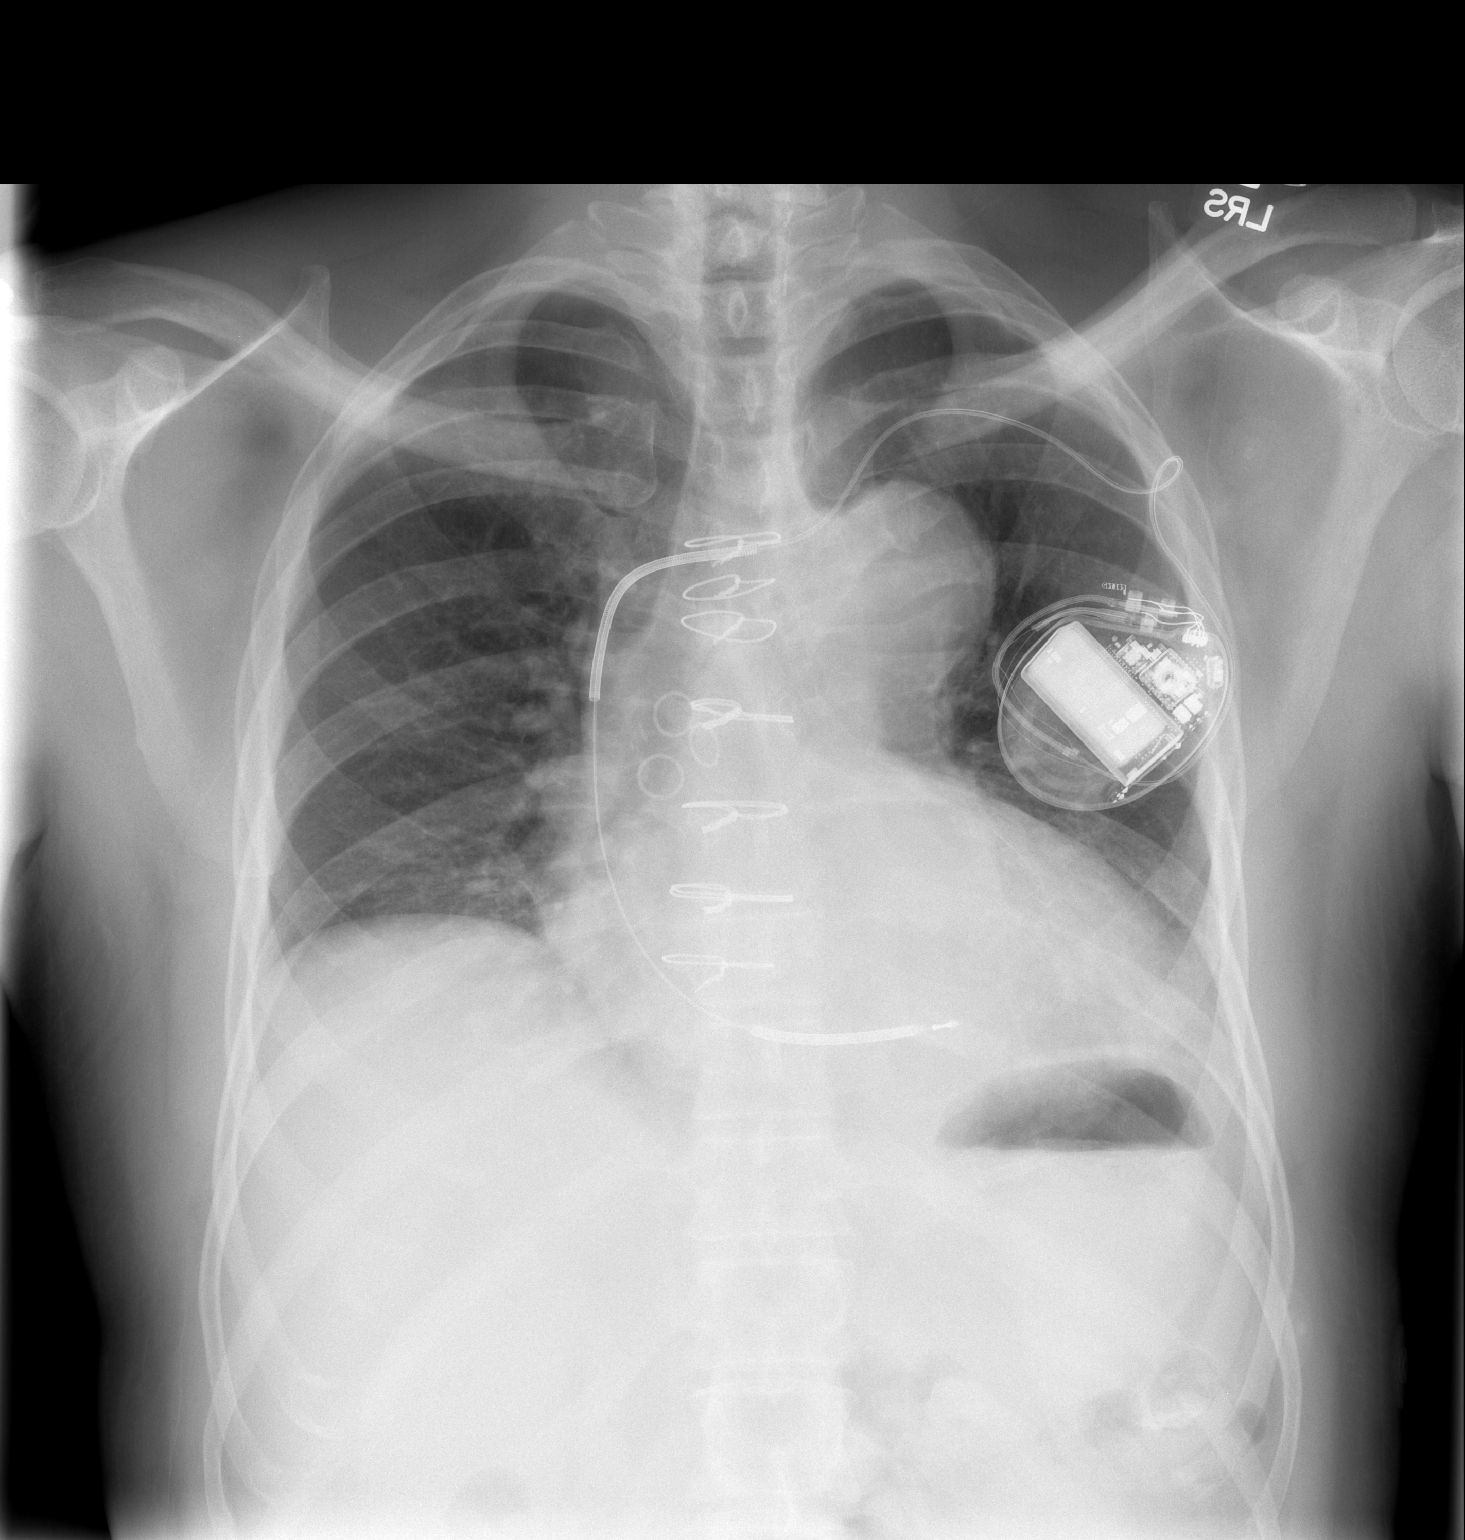

[w chest lat]
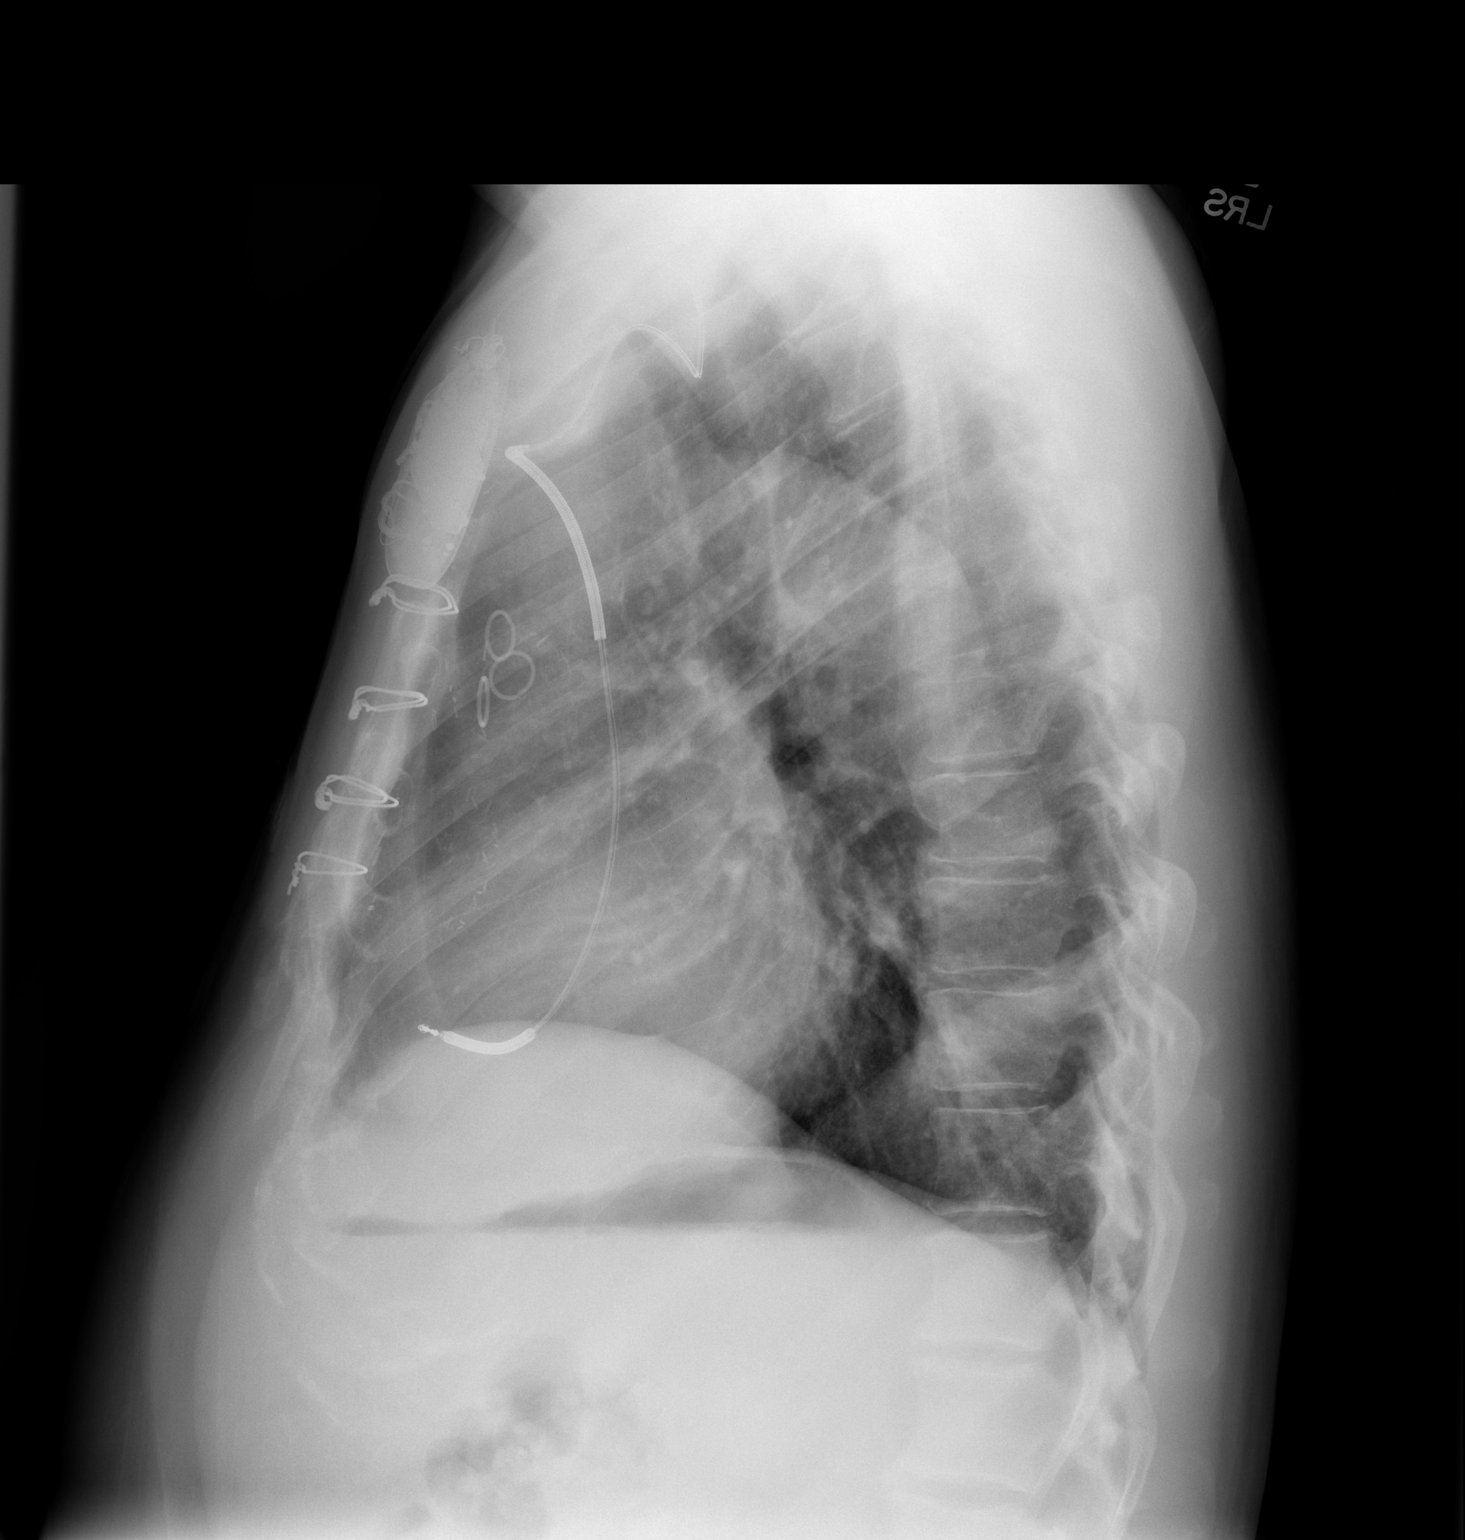

[2 of 2 positions shown; findings below may reference images not displayed]

FINDINGS: Enlargement of cardiac silhouette post CABG.
Left subclavian AICD lead tip projects over right ventricle.
Enlargement of distal aortic arch in patient with prior aortic
dissection, grossly stable.
Pulmonary vascularity normal.
Minimal right basilar atelectasis.
Lungs otherwise clear.
No pleural effusion or pneumothorax.
Bones unremarkable.
IMPRESSION: Enlargement cardiac silhouette post CABG and AICD.
Minimal right basilar atelectasis.
Prominent aortic arch, corresponding to known aortic dissection,
grossly stable.

## 2012-07-18 MED ORDER — AMLODIPINE BESYLATE 5 MG PO TABS: 5.0000 mg | ORAL_TABLET | Freq: Two times a day (BID) | ORAL | Status: DC

## 2012-07-18 NOTE — Telephone Encounter (Signed)
Add norvasc 5 bid. Need to follow BP closely.

## 2012-07-18 NOTE — Telephone Encounter (Signed)
Pt aware and rx sent into pharmacy

## 2012-08-08 ENCOUNTER — Other Ambulatory Visit (HOSPITAL_COMMUNITY): Payer: Self-pay | Admitting: Cardiology

## 2012-08-08 MED ORDER — FUROSEMIDE 80 MG PO TABS: 80.0000 mg | ORAL_TABLET | Freq: Every day | ORAL | Status: DC | PRN

## 2012-08-08 MED ORDER — LORATADINE 10 MG PO TABS: 10.0000 mg | ORAL_TABLET | Freq: Every day | ORAL | Status: DC

## 2012-08-08 MED ORDER — DOCUSATE SODIUM 100 MG PO CAPS: 100.0000 mg | ORAL_CAPSULE | Freq: Every day | ORAL | Status: DC | PRN

## 2012-08-08 MED ORDER — CLOPIDOGREL BISULFATE 75 MG PO TABS: 75.0000 mg | ORAL_TABLET | Freq: Every day | ORAL | Status: DC

## 2012-08-08 MED ORDER — ASPIRIN 81 MG PO TABS: 81.0000 mg | ORAL_TABLET | Freq: Every day | ORAL | Status: DC

## 2012-08-08 MED ORDER — HYDRALAZINE HCL 25 MG PO TABS: 75.0000 mg | ORAL_TABLET | Freq: Three times a day (TID) | ORAL | Status: DC

## 2012-08-08 MED ORDER — POTASSIUM CHLORIDE CRYS ER 20 MEQ PO TBCR: 20.0000 meq | EXTENDED_RELEASE_TABLET | Freq: Every day | ORAL | Status: DC

## 2012-08-08 MED ORDER — DIGOXIN 250 MCG PO TABS: 0.2500 mg | ORAL_TABLET | Freq: Every day | ORAL | Status: DC

## 2012-08-08 MED ORDER — NITROGLYCERIN 0.4 MG SL SUBL: 0.4000 mg | SUBLINGUAL_TABLET | SUBLINGUAL | Status: DC | PRN

## 2012-08-08 MED ORDER — LISINOPRIL 20 MG PO TABS: 20.0000 mg | ORAL_TABLET | Freq: Every day | ORAL | Status: DC

## 2012-08-08 MED ORDER — VARDENAFIL HCL 20 MG PO TABS: 20.0000 mg | ORAL_TABLET | Freq: Every day | ORAL | Status: DC | PRN

## 2012-08-08 MED ORDER — ISOSORBIDE MONONITRATE ER 30 MG PO TB24: 30.0000 mg | ORAL_TABLET | Freq: Two times a day (BID) | ORAL | Status: DC

## 2012-08-08 MED ORDER — EQL FISH OIL 1000 MG PO CAPS: 2000.0000 mg | ORAL_CAPSULE | Freq: Every day | ORAL | Status: DC

## 2012-08-08 MED ORDER — AMLODIPINE BESYLATE 5 MG PO TABS: 5.0000 mg | ORAL_TABLET | Freq: Two times a day (BID) | ORAL | Status: DC

## 2012-08-08 MED ORDER — CARVEDILOL 25 MG PO TABS: 25.0000 mg | ORAL_TABLET | Freq: Two times a day (BID) | ORAL | Status: DC

## 2012-08-08 NOTE — Telephone Encounter (Signed)
[name] with [facility] [phone] called to let us know that the pts current pharmacy [facility] will be closing and pt is completely out of all his meds. At the pts request meds sent back to [facility] [address]

## 2012-08-25 ENCOUNTER — Emergency Department (HOSPITAL_COMMUNITY)
Admission: EM | Admit: 2012-08-25 | Discharge: 2012-08-25 | Disposition: A | Discharge status: Home / Self Care | Payer: Medicaid Other | Attending: Emergency Medicine | Admitting: Emergency Medicine

## 2012-08-25 ENCOUNTER — Encounter (HOSPITAL_COMMUNITY): Payer: Self-pay | Admitting: *Deleted

## 2012-08-25 DIAGNOSIS — Z882 Allergy status to sulfonamides status: Secondary | ICD-10-CM | POA: Insufficient documentation

## 2012-08-25 DIAGNOSIS — E785 Hyperlipidemia, unspecified: Secondary | ICD-10-CM | POA: Insufficient documentation

## 2012-08-25 DIAGNOSIS — G8929 Other chronic pain: Secondary | ICD-10-CM | POA: Insufficient documentation

## 2012-08-25 DIAGNOSIS — I5022 Chronic systolic (congestive) heart failure: Secondary | ICD-10-CM | POA: Insufficient documentation

## 2012-08-25 DIAGNOSIS — R52 Pain, unspecified: Secondary | ICD-10-CM | POA: Insufficient documentation

## 2012-08-25 DIAGNOSIS — Z951 Presence of aortocoronary bypass graft: Secondary | ICD-10-CM | POA: Insufficient documentation

## 2012-08-25 DIAGNOSIS — M109 Gout, unspecified: Secondary | ICD-10-CM | POA: Insufficient documentation

## 2012-08-25 DIAGNOSIS — I129 Hypertensive chronic kidney disease with stage 1 through stage 4 chronic kidney disease, or unspecified chronic kidney disease: Secondary | ICD-10-CM | POA: Insufficient documentation

## 2012-08-25 DIAGNOSIS — Z95810 Presence of automatic (implantable) cardiac defibrillator: Secondary | ICD-10-CM | POA: Insufficient documentation

## 2012-08-25 DIAGNOSIS — N189 Chronic kidney disease, unspecified: Secondary | ICD-10-CM | POA: Insufficient documentation

## 2012-08-25 DIAGNOSIS — I2510 Atherosclerotic heart disease of native coronary artery without angina pectoris: Secondary | ICD-10-CM | POA: Insufficient documentation

## 2012-08-25 DIAGNOSIS — Z79899 Other long term (current) drug therapy: Secondary | ICD-10-CM | POA: Insufficient documentation

## 2012-08-25 DIAGNOSIS — I509 Heart failure, unspecified: Secondary | ICD-10-CM | POA: Insufficient documentation

## 2012-08-25 DIAGNOSIS — M542 Cervicalgia: Secondary | ICD-10-CM | POA: Insufficient documentation

## 2012-08-25 DIAGNOSIS — Z85850 Personal history of malignant neoplasm of thyroid: Secondary | ICD-10-CM | POA: Insufficient documentation

## 2012-08-25 MED ORDER — OXYCODONE-ACETAMINOPHEN 10-325 MG PO TABS: 1.0000 | ORAL_TABLET | ORAL | Status: AC | PRN

## 2012-08-25 MED ORDER — HYDROMORPHONE HCL PF 1 MG/ML IJ SOLN
1.0000 mg | Freq: Once | INTRAMUSCULAR | Status: AC
  Administered 2012-08-25: 1 mg via INTRAMUSCULAR
  Filled 2012-08-25: qty 1

## 2012-08-25 NOTE — ED Notes (Signed)
Pt is here with neck pain that runs down between shoulder blades and states that this has been going on for last 2-3 years and increased in the last 12 hours.  ambulatory

## 2012-08-25 NOTE — ED Provider Notes (Signed)
History     CSN: [id number]  Arrival date & time 08/25/12  0535   First MD Initiated Contact with Patient 08/25/12 0701      Chief Complaint  Patient presents with  . Neck Pain    (Consider location/radiation/quality/duration/timing/severity/associated sxs/prior treatment) Patient is a 51 y.o. male presenting with neck pain. The history is provided by the patient.  Neck Pain  This is a chronic problem. Pertinent negatives include no chest pain.   patient has a history of chronic neck pain. He states that his been going for years but worse for last few days. No trauma. Pain is worse with movement. He states it feels like there is grinding and there. He is a chronic pain medications. He states the medications have not been helping as much. No numbness or weakness. No chest pain. No lightheadedness or dizziness. He states he still has his pain medications. No difficulty breathing or swallowing. No swelling.  Past Medical History  Diagnosis Date  . CAD (coronary artery disease)     a. s/p CABG 2006;  b. DES to PDA 2011 (cath: Dx not seen, dRCA/PDA tx with DES; S-PDA occluded (culprit), S-Dx occluded, S-RI and OM ok, L-LAD ok  . HTN (hypertension)     severe  . Chronic systolic heart failure     a. echo 8/11: EF 35%, mod LVH, Grade 1 diast dysfxn, mild AI, mild MR, inf and post HK  . 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 CRI (chronic renal insufficiency)   . Thyroid cancer     Hertle Cell  . HLD (hyperlipidemia)   . Anemia   . Gout   . AICD (automatic cardioverter/defibrillator) present   . Carotid stenosis     dopplers 2011: 0-39% bilat.  . Chest pain syndrome     Past Surgical History  Procedure Date  . Coronary artery bypass graft 06/21/2009  . Cardiac defibrillator placement     [employer]  . Thyroidectomy, partial 06/20/11  . Lumbar disc surgery   . Status post emergency repair of a type a ascending aortic dissection with a hemiarch  reconstruction of the ascending aorta  using a 28-mm hemashield graft with redo sternotomy and revision of previous bypass grafts in june 2010.   . Median sternotomy,cabg x 7 06/29/2005    Family History  Problem Relation Age of Onset  . Coronary artery disease    . Hypertension Father   . Heart disease Father   . Early death Father   . COPD Father   . Hypertension Mother     History  Substance Use Topics  . Smoking status: Never Smoker   . Smokeless tobacco: Never Used  . Alcohol Use: No      Review of Systems  Constitutional: Negative for fever and chills.  HENT: Positive for neck pain and neck stiffness. Negative for hearing loss and ear discharge.   Respiratory: Negative for cough and shortness of breath.   Cardiovascular: Negative for chest pain.  Gastrointestinal: Negative for abdominal pain.  Musculoskeletal: Positive for back pain. Negative for myalgias and joint swelling.  Hematological: Negative for adenopathy. Does not bruise/bleed easily.    Allergies  Lipitor; Sulfonamide derivatives; Iohexol; Latex; and Zocor  Home Medications   Current Outpatient Rx  Name Route Sig Dispense Refill  . AMLODIPINE BESYLATE 5 MG PO TABS Oral Take 1 tablet (5 mg total) by mouth 2 (two) times daily. 60 tablet 6  . ASPIRIN  81 MG PO TABS Oral Take 1 tablet (81 mg total) by mouth daily. Take 2 tabs daily 30 tablet 6  . CALCIUM CARBONATE ANTACID 750 MG PO CHEW Oral Chew 1 tablet by mouth daily as needed. For heart burn    . CARVEDILOL 25 MG PO TABS Oral Take 1 tablet (25 mg total) by mouth 2 (two) times daily with a meal. 60 tablet 6  . CLOPIDOGREL BISULFATE 75 MG PO TABS Oral Take 1 tablet (75 mg total) by mouth daily. 30 tablet 6  . DIGOXIN 0.25 MG PO TABS Oral Take 1 tablet (0.25 mg total) by mouth daily. 30 tablet 6  . DOCUSATE SODIUM 100 MG PO CAPS Oral Take 1 capsule (100 mg total) by mouth daily as needed. For constipation 30 capsule 6  . HYDRALAZINE HCL 25 MG PO TABS Oral  Take 3 tablets (75 mg total) by mouth 3 (three) times daily. 240 tablet 6  . ISOSORBIDE MONONITRATE ER 30 MG PO TB24 Oral Take 1 tablet (30 mg total) by mouth 2 (two) times daily. 60 tablet 6  . LISINOPRIL 20 MG PO TABS Oral Take 1 tablet (20 mg total) by mouth daily. 30 tablet 6  . EQL FISH OIL 1000 MG PO CAPS Oral Take 2 capsules (2,000 mg total) by mouth daily. 90 each 6  . OXYCODONE HCL ER 30 MG PO TB12 Oral Take 30 mg by mouth every 8 (eight) hours.     [name] POTASSIUM CHLORIDE CRYS ER 20 MEQ PO TBCR Oral Take 20 mEq by mouth daily as needed. For bone pain    . VARDENAFIL HCL 20 MG PO TABS Oral Take 1 tablet (20 mg total) by mouth daily as needed. For Erectile Dysfunction 10 tablet 6  . NITROGLYCERIN 0.4 MG SL SUBL Sublingual Place 1 tablet (0.4 mg total) under the tongue every 5 (five) minutes as needed for chest pain. 25 tablet 6  . OXYCODONE-ACETAMINOPHEN 10-325 MG PO TABS Oral Take 1 tablet by mouth every 4 (four) hours as needed for pain. 15 tablet 0    BP 131/76  Pulse 66  Temp 98 F (36.7 C) (Oral)  Resp 18  SpO2 98%  Physical Exam  Constitutional: He appears well-developed.  HENT:  Head: Normocephalic and atraumatic.  Neck: Normal range of motion. Neck supple. No tracheal deviation present.       Patient was mild paraspinal tenderness. No midline tenderness, step-off or deformity.  Cardiovascular: Normal rate and regular rhythm.   Pulmonary/Chest: Effort normal and breath sounds normal.  Musculoskeletal: Normal range of motion. He exhibits no edema.  Lymphadenopathy:    He has no cervical adenopathy.  Skin: Skin is warm.    ED Course  Procedures (including critical care time)  Labs Reviewed - No data to display No results found.   1. Neck pain       MDM  Patient with neck pain. Acute on chronic. No trauma. No numbness or weakness. He is on chronic pain medications but but does not have any medications for breakthrough pain. He is previous CT of the cervical spine  it does show some spinal stenosis. he also has degenerative disease. There was from 3 years ago. He may need followup but I believe his Coumadin as an outpatient. He'll be given pain medications here and some medications for breakthrough pain at home. He'll follow with his primary care Dr.   Juliet Rude. Rubin Payor, MD 08/26/12 [phone]

## 2012-09-09 ENCOUNTER — Other Ambulatory Visit: Payer: Self-pay | Admitting: Cardiothoracic Surgery

## 2012-09-09 DIAGNOSIS — I7101 Dissection of thoracic aorta: Secondary | ICD-10-CM

## 2012-09-27 IMAGING — CR DG CHEST 2V
2 series · 2 of 2 positions shown · non-contrast
Comparison: 02/29/2012

CLINICAL DATA: Left-sided chest pain

CHEST - 2 VIEW

[w chest pa]
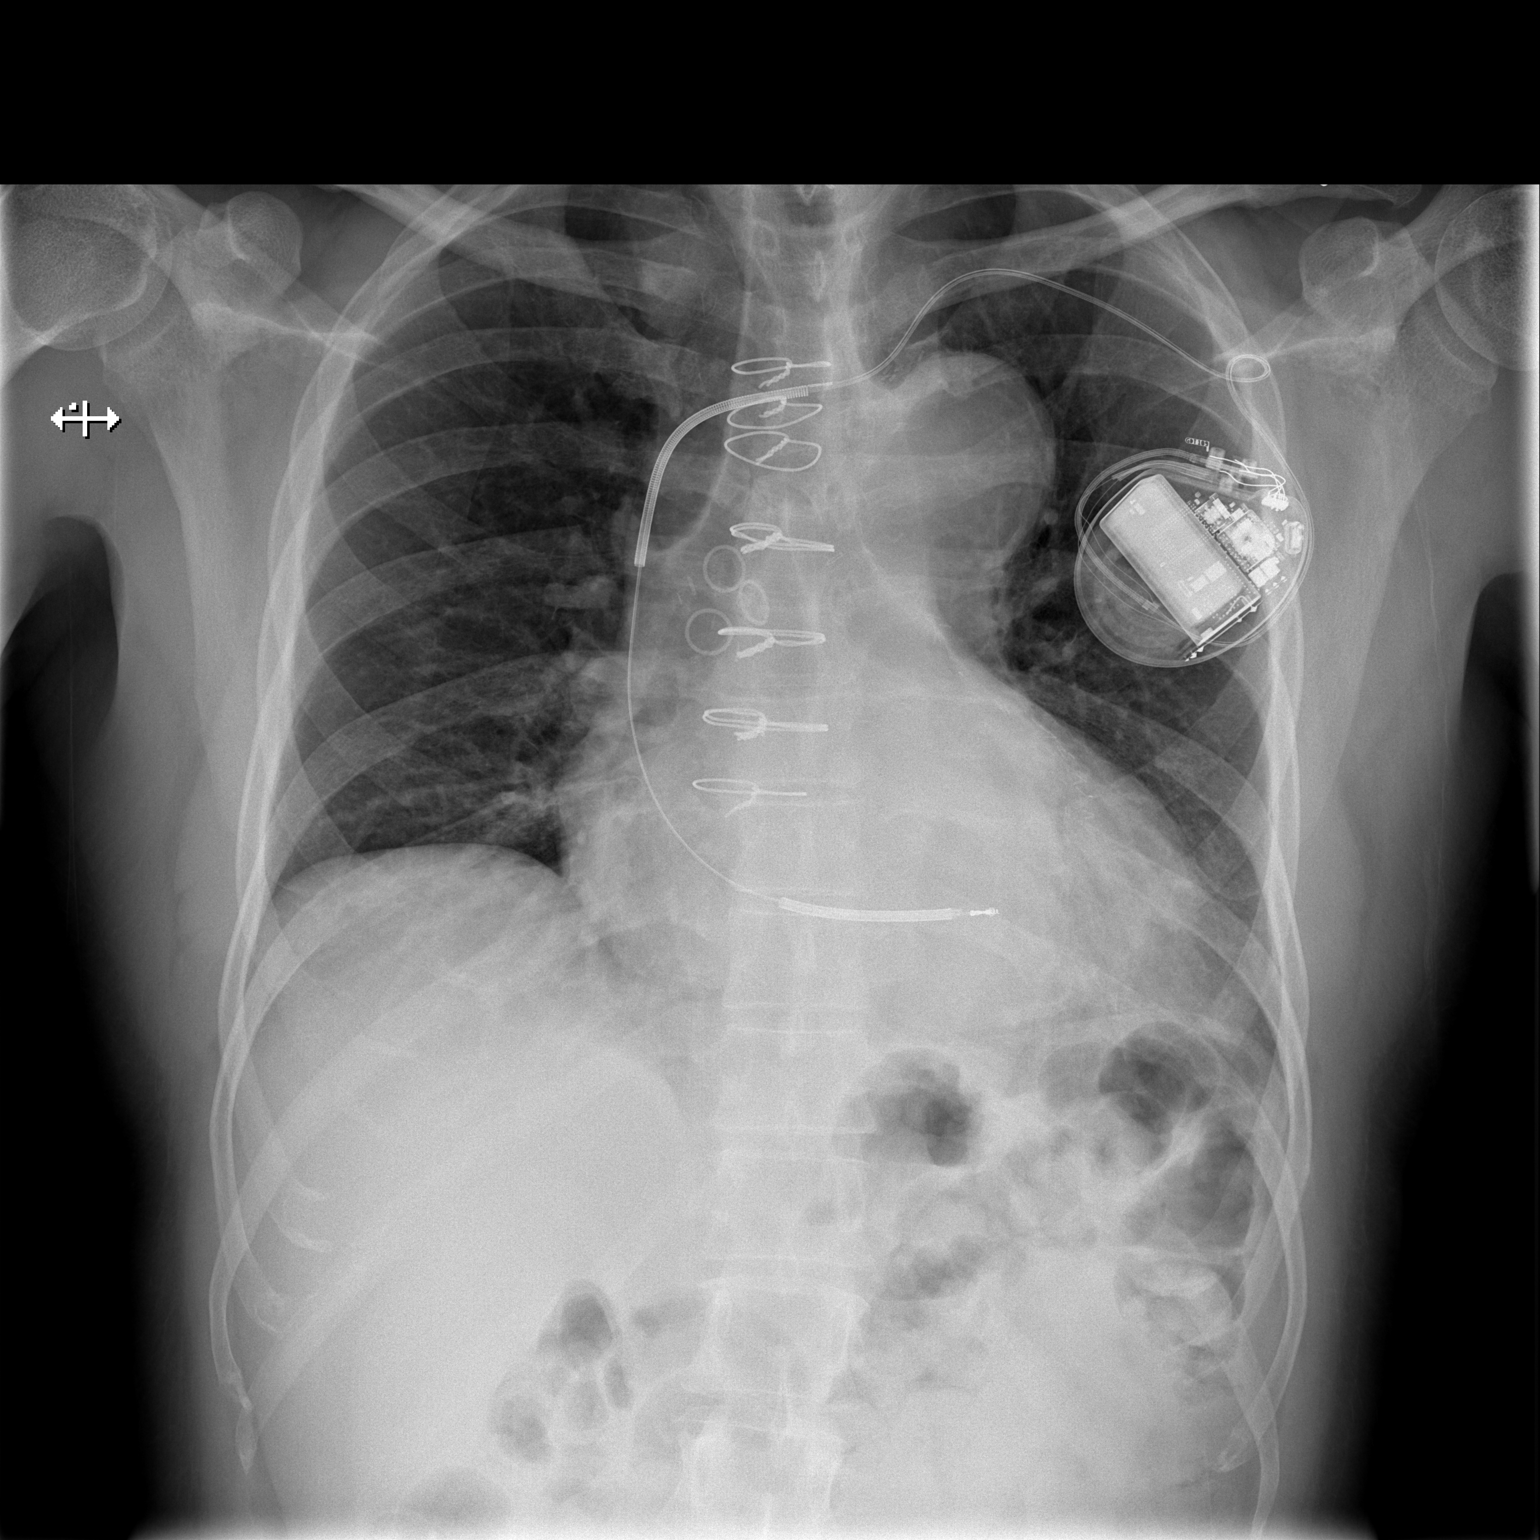

[w chest lat]
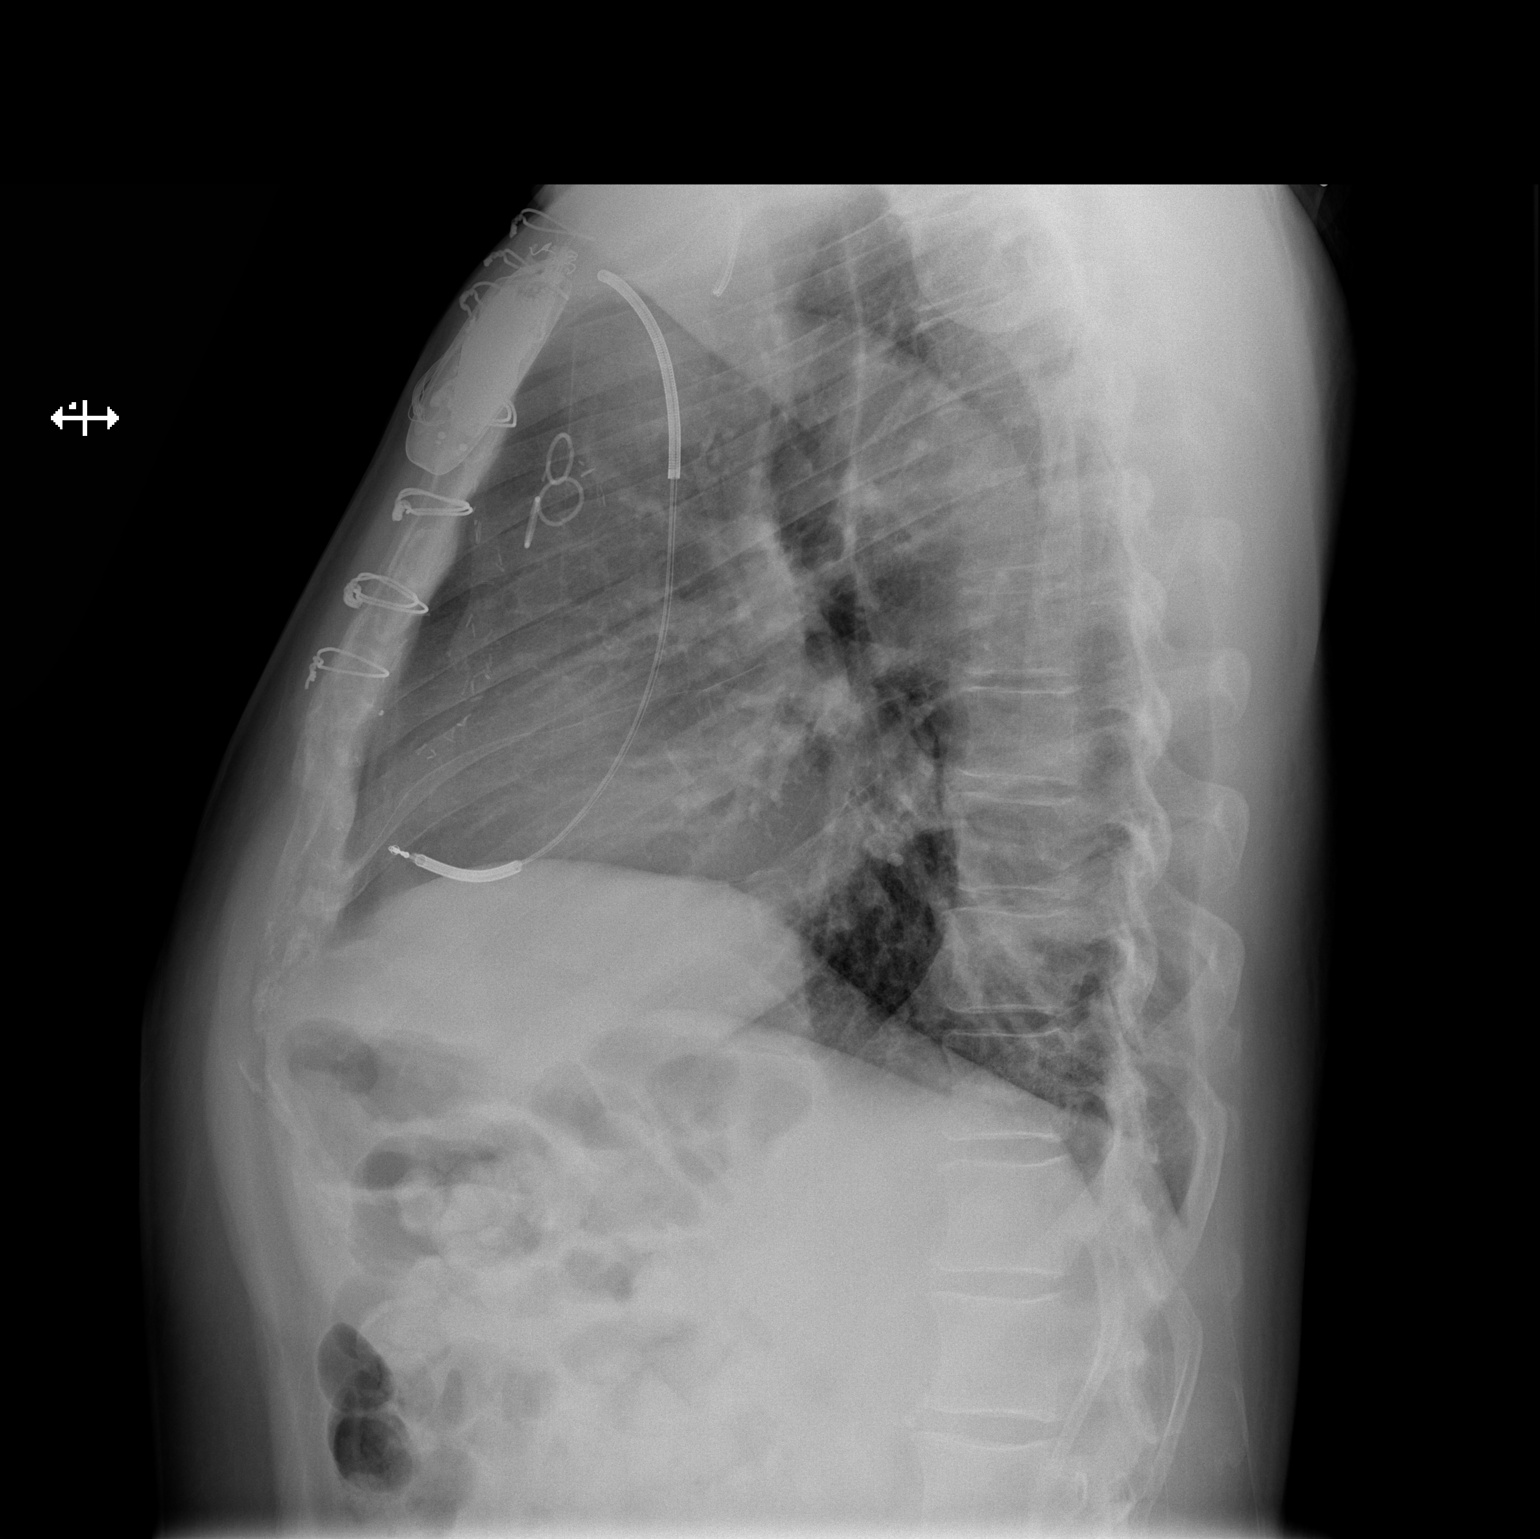

[2 of 2 positions shown; findings below may reference images not displayed]

FINDINGS: Evidence of CABG noted.  Left AICD in place.  Moderate
cardiomegaly noted with aortic ectasia, unchanged.  Curvilinear
left lower lobe scarring or atelectasis.  No pleural effusion.  No
acute osseous finding.
IMPRESSION: Cardiomegaly without focal acute finding.

## 2012-10-08 ENCOUNTER — Ambulatory Visit (HOSPITAL_COMMUNITY)
Admission: RE | Admit: 2012-10-08 | Discharge: 2012-10-08 | Disposition: A | Discharge status: Home / Self Care | Payer: Medicaid Other | Source: Ambulatory Visit | Attending: Internal Medicine | Admitting: Internal Medicine

## 2012-10-08 VITALS — BP 158/98 | HR 60 | Wt 177.2 lb

## 2012-10-08 DIAGNOSIS — R079 Chest pain, unspecified: Secondary | ICD-10-CM

## 2012-10-08 DIAGNOSIS — I2510 Atherosclerotic heart disease of native coronary artery without angina pectoris: Secondary | ICD-10-CM

## 2012-10-08 DIAGNOSIS — E785 Hyperlipidemia, unspecified: Secondary | ICD-10-CM

## 2012-10-08 DIAGNOSIS — I10 Essential (primary) hypertension: Secondary | ICD-10-CM

## 2012-10-08 DIAGNOSIS — I5022 Chronic systolic (congestive) heart failure: Secondary | ICD-10-CM | POA: Insufficient documentation

## 2012-10-08 DIAGNOSIS — E782 Mixed hyperlipidemia: Secondary | ICD-10-CM | POA: Insufficient documentation

## 2012-10-08 NOTE — Progress Notes (Signed)
Patient ID: Chad [name], male   DOB: 1961-03-10, 51 y.o.   MRN: [id number] History of Present Illness: Primary Cardiologist:  Dr. Patience Musca is a 51 y.o. male with history of severe HTN, coronary artery disease status post previous myocardial infarction and bypass surgery in 2006 with DES to native PDA in 2011.  He also has a history congestive heart failure secondary to ischemic cardiomyopathy with EF in 30-35% range.  He is s/p single chamber ICD.  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 He underwent emergent repair of proximal aorta and reimplantation of his CABG grafts.  Carotids u/s in 2011 0-39%.  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 Also with significant low back pain s/p 2 surgeries  ETT/Myoview in 9/12: Walked 8:46 on [place]. Moderate-sized fixed inferior perfusion defect consistent with prior MI. EF 31% with septal hypokinesis. There was also inferior hypokinesis. Similar to prior study.   He returns for follow up. Last week he complained of chest discomfort that lasted for 2 days.Pain was different from pain associated with MI.  Pain is no longer present. SOB going up steps. Denies PND/Orthopnea.  Weight at home 170-175 pounds. SBP at home < 120. Appetite ok. Walks over a mile daily. Complaint with medications. Did not take BP medications prior this appointment.   Lab Results  Component Value Date   CHOL 150 02/29/2012   HDL 44 02/29/2012   LDLCALC 84 02/29/2012   LDLDIRECT 120.1 01/14/2008   TRIG 111 02/29/2012   CHOLHDL 3.4 02/29/2012     .   ROS: All pertinent positives and negatives as in HPI, otherwise negative.    Past Medical History  Diagnosis Date  . CAD (coronary artery disease)     a. s/p CABG 2006;  b. DES to PDA 2011 (cath: Dx not seen, dRCA/PDA tx with DES; S-PDA occluded (culprit), S-Dx occluded, S-RI and OM ok, L-LAD ok  . HTN (hypertension)     severe  . Chronic systolic heart  failure     a. echo 8/11: EF 35%, mod LVH, Grade 1 diast dysfxn, mild AI, mild MR, inf and post HK  . 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 CRI (chronic renal insufficiency)   . Thyroid cancer     Hertle Cell  . HLD (hyperlipidemia)   . Anemia   . Gout   . AICD (automatic cardioverter/defibrillator) present   . Carotid stenosis     dopplers 2011: 0-39% bilat.  . Chest pain syndrome     Current Outpatient Prescriptions  Medication Sig Dispense Refill  . amLODipine (NORVASC) 5 MG tablet Take 1 tablet (5 mg total) by mouth 2 (two) times daily.  60 tablet  6  . aspirin 81 MG tablet Take 1 tablet (81 mg total) by mouth daily. Take 2 tabs daily  30 tablet  6  . calcium carbonate (TUMS EX) 750 MG chewable tablet Chew 1 tablet by mouth daily as needed. For heart burn      . carvedilol (COREG) 25 MG tablet Take 1 tablet (25 mg total) by mouth 2 (two) times daily with a meal.  60 tablet  6  . clopidogrel (PLAVIX) 75 MG tablet Take 1 tablet (75 mg total) by mouth daily.  30 tablet  6  . digoxin (LANOXIN) 0.25 MG tablet Take 1 tablet (0.25 mg total) by mouth daily.  30 tablet  6  . docusate sodium (COLACE) 100 MG capsule Take 1 capsule (100 mg total) by mouth daily as needed. For constipation  30 capsule  6  . hydrALAZINE (APRESOLINE) 25 MG tablet Take 3 tablets (75 mg total) by mouth 3 (three) times daily.  240 tablet  6  . isosorbide mononitrate (IMDUR) 30 MG 24 hr tablet Take 1 tablet (30 mg total) by mouth 2 (two) times daily.  60 tablet  6  . lisinopril (PRINIVIL,ZESTRIL) 20 MG tablet Take 1 tablet (20 mg total) by mouth daily.  30 tablet  6  . nitroGLYCERIN (NITROSTAT) 0.4 MG SL tablet Place 1 tablet (0.4 mg total) under the tongue every 5 (five) minutes as needed for chest pain.  25 tablet  6  . Omega-3 Fatty Acids (EQL FISH OIL) 1000 MG CAPS Take 2 capsules (2,000 mg total) by mouth daily.  90 each  6  . oxycodone (OXYCONTIN) 30 MG TB12 Take 30 mg by mouth every  8 (eight) hours.       . potassium chloride SA (K-DUR,KLOR-CON) 20 MEQ tablet Take 20 mEq by mouth daily as needed. For bone pain      . vardenafil (LEVITRA) 20 MG tablet Take 1 tablet (20 mg total) by mouth daily as needed. For Erectile Dysfunction  10 tablet  6    Allergies  Allergen Reactions  . Lipitor (Atorvastatin Calcium) Anaphylaxis    Large doses  . Sulfonamide Derivatives Shortness Of Breath  . Iohexol      Desc: PT HAS ANAPHYLAXIS WITH CONTRAST MEDIA!, Onset Date: 45409811   Code: SOB, Desc: ok w/ 13hr prep//a.c., Onset Date: 91478295   . Latex Other (See Comments)    With long periods of exposure REACTION: Reaction not known  . Zocor (Simvastatin) Other (See Comments)    unknown    Vital Signs: BP 158/98  Pulse 60  Wt 177 lb 4 oz (80.4 kg)  SpO2 100%  PHYSICAL EXAM: No acute distress, Daughter present HEENT: normal Neck: JVP  Cardiac:  normal S1, S2; RRR; 2/6 SEM RSB + s4 Lungs:  clear to auscultation bilaterally, no wheezing, rhonchi or rales Abd: soft, nontender, no hepatomegaly Ext: no edema Skin: warm and dry Neuro:  CNs 2-12 intact, no focal abnormalities noted. Full strength in BLE. Mild tenderness to palpation over paraspinal muscles  EKG: SR 62 bpm LVH with repol  ASSESSMENT AND PLAN:

## 2012-10-08 NOTE — Patient Instructions (Addendum)
Follow up in 4 months with Dr Gala Romney    Please schedule ECHO  Do the following things EVERYDAY: 1) Weigh yourself in the morning before breakfast. Write it down and keep it in a log. 2) Take your medicines as prescribed 3) Eat low salt foods-Limit salt (sodium) to 2000 mg per day.  4) Stay as active as you can everyday 5) Limit all fluids for the day to less than 2 liters

## 2012-10-10 ENCOUNTER — Other Ambulatory Visit: Payer: Medicaid Other

## 2012-10-15 ENCOUNTER — Other Ambulatory Visit (INDEPENDENT_AMBULATORY_CARE_PROVIDER_SITE_OTHER): Payer: Medicaid Other

## 2012-10-15 ENCOUNTER — Ambulatory Visit (INDEPENDENT_AMBULATORY_CARE_PROVIDER_SITE_OTHER): Payer: Medicaid Other | Admitting: *Deleted

## 2012-10-15 DIAGNOSIS — I5022 Chronic systolic (congestive) heart failure: Secondary | ICD-10-CM

## 2012-10-15 DIAGNOSIS — Z23 Encounter for immunization: Secondary | ICD-10-CM

## 2012-10-15 DIAGNOSIS — E785 Hyperlipidemia, unspecified: Secondary | ICD-10-CM

## 2012-10-15 LAB — HEPATIC FUNCTION PANEL
AST: 16 U/L (ref 0–37)
Albumin: 4 g/dL (ref 3.5–5.2)
Alkaline Phosphatase: 57 U/L (ref 39–117)
Total Protein: 7.4 g/dL (ref 6.0–8.3)

## 2012-10-15 LAB — BASIC METABOLIC PANEL
Calcium: 8.7 mg/dL (ref 8.4–10.5)
Creatinine, Ser: 1.5 mg/dL (ref 0.4–1.5)
GFR: 64.26 mL/min (ref >60.00)

## 2012-10-15 LAB — LIPID PANEL
Cholesterol: 200 mg/dL (ref 0–200)
LDL Cholesterol: 121 mg/dL — ABNORMAL HIGH (ref 0–99)
Triglycerides: 163 mg/dL — ABNORMAL HIGH (ref 0.0–149.0)

## 2012-10-16 ENCOUNTER — Other Ambulatory Visit: Payer: Medicaid Other

## 2012-10-16 ENCOUNTER — Ambulatory Visit: Payer: Medicaid Other | Admitting: Cardiothoracic Surgery

## 2012-10-17 ENCOUNTER — Encounter: Payer: Medicaid Other | Admitting: *Deleted

## 2012-10-18 ENCOUNTER — Encounter: Payer: Self-pay | Admitting: *Deleted

## 2012-10-30 ENCOUNTER — Ambulatory Visit (HOSPITAL_COMMUNITY): Payer: Medicaid Other | Attending: Family Medicine

## 2012-11-01 ENCOUNTER — Emergency Department (HOSPITAL_COMMUNITY)
Admission: EM | Admit: 2012-11-01 | Discharge: 2012-11-01 | Disposition: A | Discharge status: Home / Self Care | Payer: Medicaid Other | Attending: Emergency Medicine | Admitting: Emergency Medicine

## 2012-11-01 ENCOUNTER — Encounter (HOSPITAL_COMMUNITY): Payer: Self-pay | Admitting: Emergency Medicine

## 2012-11-01 DIAGNOSIS — I5022 Chronic systolic (congestive) heart failure: Secondary | ICD-10-CM | POA: Insufficient documentation

## 2012-11-01 DIAGNOSIS — T50995A Adverse effect of other drugs, medicaments and biological substances, initial encounter: Secondary | ICD-10-CM | POA: Insufficient documentation

## 2012-11-01 DIAGNOSIS — N189 Chronic kidney disease, unspecified: Secondary | ICD-10-CM | POA: Insufficient documentation

## 2012-11-01 DIAGNOSIS — T887XXA Unspecified adverse effect of drug or medicament, initial encounter: Secondary | ICD-10-CM

## 2012-11-01 DIAGNOSIS — E785 Hyperlipidemia, unspecified: Secondary | ICD-10-CM | POA: Insufficient documentation

## 2012-11-01 DIAGNOSIS — Z862 Personal history of diseases of the blood and blood-forming organs and certain disorders involving the immune mechanism: Secondary | ICD-10-CM | POA: Insufficient documentation

## 2012-11-01 DIAGNOSIS — Z79899 Other long term (current) drug therapy: Secondary | ICD-10-CM | POA: Insufficient documentation

## 2012-11-01 DIAGNOSIS — Z951 Presence of aortocoronary bypass graft: Secondary | ICD-10-CM | POA: Insufficient documentation

## 2012-11-01 DIAGNOSIS — Z7982 Long term (current) use of aspirin: Secondary | ICD-10-CM | POA: Insufficient documentation

## 2012-11-01 DIAGNOSIS — Z9889 Other specified postprocedural states: Secondary | ICD-10-CM | POA: Insufficient documentation

## 2012-11-01 DIAGNOSIS — I129 Hypertensive chronic kidney disease with stage 1 through stage 4 chronic kidney disease, or unspecified chronic kidney disease: Secondary | ICD-10-CM | POA: Insufficient documentation

## 2012-11-01 DIAGNOSIS — Z8639 Personal history of other endocrine, nutritional and metabolic disease: Secondary | ICD-10-CM | POA: Insufficient documentation

## 2012-11-01 DIAGNOSIS — Z85850 Personal history of malignant neoplasm of thyroid: Secondary | ICD-10-CM | POA: Insufficient documentation

## 2012-11-01 DIAGNOSIS — I2510 Atherosclerotic heart disease of native coronary artery without angina pectoris: Secondary | ICD-10-CM | POA: Insufficient documentation

## 2012-11-01 NOTE — ED Notes (Signed)
Dr. Rubin Payor back at the bedside. Pt stating he is ready to go home.

## 2012-11-01 NOTE — ED Provider Notes (Addendum)
History     CSN: [id number]  Arrival date & time 11/01/12  4098   First MD Initiated Contact with Patient 11/01/12 1013      Chief Complaint  Patient presents with  . Nausea    (Consider location/radiation/quality/duration/timing/severity/associated sxs/prior treatment) The history is provided by the patient.   patient presents because he took his Lopressor medications at the wrong time. He states he missed his dose from 9 PM last night. He states that he took it at 8:30 this morning and then checked his blood pressure after and it was low. He states it was 1 teens over 44s. He states this is where he normally runs. He states that he is worried in a few hours his blood pressure dropped. He states he is used to managing his blood pressure medications. He is without complaints now except for some tiredness. He states that he normally gets tired after his medications. He did not take his normal medications at 9 or 10 this morning. No chest pain.   Past Medical History  Diagnosis Date  . CAD (coronary artery disease)     a. s/p CABG 2006;  b. DES to PDA 2011 (cath: Dx not seen, dRCA/PDA tx with DES; S-PDA occluded (culprit), S-Dx occluded, S-RI and OM ok, L-LAD ok  . HTN (hypertension)     severe  . Chronic systolic heart failure     a. echo 8/11: EF 35%, mod LVH, Grade 1 diast dysfxn, mild AI, mild MR, inf and post HK  . 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 CRI (chronic renal insufficiency)   . Thyroid cancer     Hertle Cell  . HLD (hyperlipidemia)   . Anemia   . Gout   . AICD (automatic cardioverter/defibrillator) present   . Carotid stenosis     dopplers 2011: 0-39% bilat.  . Chest pain syndrome     Past Surgical History  Procedure Date  . Coronary artery bypass graft 06/21/2009  . Cardiac defibrillator placement     [employer]  . Thyroidectomy, partial 06/20/11  . Lumbar disc surgery   . Status post emergency repair of a type a  ascending aortic dissection with a hemiarch reconstruction of the ascending aorta  using a 28-mm hemashield graft with redo sternotomy and revision of previous bypass grafts in june 2010.   . Median sternotomy,cabg x 7 06/29/2005    Family History  Problem Relation Age of Onset  . Coronary artery disease    . Hypertension Father   . Heart disease Father   . Early death Father   . COPD Father   . Hypertension Mother     History  Substance Use Topics  . Smoking status: Never Smoker   . Smokeless tobacco: Never Used  . Alcohol Use: No      Review of Systems  Constitutional: Negative for activity change and appetite change.  HENT: Negative for neck stiffness.   Eyes: Negative for pain.  Respiratory: Negative for chest tightness and shortness of breath.   Cardiovascular: Negative for chest pain and leg swelling.  Gastrointestinal: Positive for nausea. Negative for vomiting, abdominal pain and diarrhea.  Genitourinary: Negative for flank pain.  Musculoskeletal: Negative for back pain.  Skin: Negative for rash.  Neurological: Negative for weakness, numbness and headaches.  Psychiatric/Behavioral: Negative for behavioral problems.    Allergies  Lipitor; Sulfonamide derivatives; Iohexol; Latex; and Zocor  Home Medications   Current Outpatient Rx  Name  Route  Sig  Dispense  Refill  . AMLODIPINE BESYLATE 5 MG PO TABS   Oral   Take 1 tablet (5 mg total) by mouth 2 (two) times daily.   60 tablet   6   . ASPIRIN EC 81 MG PO TBEC   Oral   Take 81 mg by mouth 2 (two) times daily.         [name] CARVEDILOL 25 MG PO TABS   Oral   Take 1 tablet (25 mg total) by mouth 2 (two) times daily with a meal.   60 tablet   6   . CLOPIDOGREL BISULFATE 75 MG PO TABS   Oral   Take 1 tablet (75 mg total) by mouth daily.   30 tablet   6   . DIGOXIN 0.25 MG PO TABS   Oral   Take 1 tablet (0.25 mg total) by mouth daily.   30 tablet   6   . HYDRALAZINE HCL 25 MG PO TABS   Oral    Take 75 mg by mouth 3 (three) times daily.          . ISOSORBIDE MONONITRATE ER 30 MG PO TB24   Oral   Take 1 tablet (30 mg total) by mouth 2 (two) times daily.   60 tablet   6   . LISINOPRIL 20 MG PO TABS   Oral   Take 1 tablet (20 mg total) by mouth daily.   30 tablet   6   . LORATADINE 10 MG PO TABS   Oral   Take 10 mg by mouth daily as needed. allergies         . NITROGLYCERIN 0.4 MG SL SUBL   Sublingual   Place 1 tablet (0.4 mg total) under the tongue every 5 (five) minutes as needed for chest pain.   25 tablet   6   . OXYCODONE HCL ER 30 MG PO TB12   Oral   Take 30 mg by mouth every 8 (eight) hours.          . OXYCODONE-ACETAMINOPHEN 10-325 MG PO TABS   Oral   Take 1 tablet by mouth every 4 (four) hours as needed. pain         . POTASSIUM CHLORIDE CRYS ER 20 MEQ PO TBCR   Oral   Take 20 mEq by mouth daily as needed. For bone pain. Sometimes takes 3 tablets at once sometimes breaks it up into multiple doses throughout the day           BP 119/69  Pulse 73  Temp 97.8 F (36.6 C) (Oral)  Resp 18  SpO2 95%  Physical Exam  Nursing note and vitals reviewed. Constitutional: He is oriented to person, place, and time. He appears well-developed and well-nourished.  HENT:  Head: Normocephalic and atraumatic.  Eyes: EOM are normal. Pupils are equal, round, and reactive to light.  Neck: Normal range of motion. Neck supple.  Cardiovascular: Normal rate, regular rhythm and normal heart sounds.   No murmur heard. Pulmonary/Chest: Effort normal and breath sounds normal.  Abdominal: Soft. Bowel sounds are normal. He exhibits no distension and no mass. There is no tenderness. There is no rebound and no guarding.  Musculoskeletal: Normal range of motion. He exhibits no edema.  Neurological: He is alert and oriented to person, place, and time. No cranial nerve deficit.  Skin: Skin is warm and dry.  Psychiatric: He has a normal mood and affect.    ED Course  Procedures (including critical care time)  Labs Reviewed - No data to display No results found.   No diagnosis found.    MDM   patient with sleepiness. He states he took his medications at the wrong time. Consider taking last night he took this morning. He states a few hours after that his blood pressure can drop. At this point hold be monitored for changes in his blood pressure. With the patient's history of CHF no extra fluid is given at this time.     Patient now states she feels better. He states he feels it has been long enough his pressure will not draw. He'll be discharged home    Juliet Rude. Rubin Payor, MD 11/01/12 1221    Juliet Rude. Rubin Payor, MD 11/01/12 1229

## 2012-11-01 NOTE — ED Notes (Signed)
Pt states he missed his PM dose of blood pressure medication so he took Aspirin 81mg , Hydralazene 75mg , Isosorb Mono, Carvedilol, Digoxin, Lisinopril 20mg , Plavix 75mg , Amilodipine 5mg  his regular dosage of medication this morning as scheduled and when he checked his blood pressure 119/75. He feels as though he may have overdosed on his medication. Pt states he made himself vomit earlier today to make the pills come back up around 8:30 am. Pt c/o of nausea and fatigue.

## 2012-11-01 NOTE — ED Notes (Signed)
missed his all his  meds last night because he fell asleep and took them this am  And when he took his bp it was low so did not want to drop any more

## 2012-11-01 NOTE — ED Notes (Signed)
Dr. Pickering back at the bedside.  

## 2012-11-02 NOTE — Assessment & Plan Note (Signed)
BP elevated here but he assures me it is fine at home. Asked him to bring cuff in at next visit to calibrate.

## 2012-11-02 NOTE — Assessment & Plan Note (Signed)
Goal LDL < 70. Check lipids. Continue statin.

## 2012-11-02 NOTE — Assessment & Plan Note (Signed)
Well compensated. Reinforced need for med compliance. Also reinforced need for daily weights and reviewed use of sliding scale diuretics.

## 2012-11-02 NOTE — Assessment & Plan Note (Signed)
Doubt CP is ischemic. Given chronic aortic dissection and CRI (with solitary kidney) would avoid cath unless objective signs of significant ischemia.

## 2012-11-12 ENCOUNTER — Telehealth (HOSPITAL_COMMUNITY): Payer: Self-pay | Admitting: *Deleted

## 2012-11-12 MED ORDER — ATORVASTATIN CALCIUM 40 MG PO TABS: 40.0000 mg | ORAL_TABLET | ORAL | Status: DC

## 2012-11-12 NOTE — Addendum Note (Signed)
Addended by: Noralee Space on: 11/12/2012 04:54 PM   Modules accepted: Orders

## 2012-11-12 NOTE — Telephone Encounter (Signed)
Pt states his reaction to Lipitor was just muscle aches and that he did not have anaphylaxis, and he is willing to try it again

## 2012-11-12 NOTE — Telephone Encounter (Signed)
Message copied by Noralee Space on Tue Nov 12, 2012  4:47 PM ------      Message from: Dolores Patty      Created: Sun Oct 20, 2012  4:08 PM       i dont see a statin on his med list. Did he stop??

## 2012-11-25 ENCOUNTER — Encounter (INDEPENDENT_AMBULATORY_CARE_PROVIDER_SITE_OTHER): Payer: Medicaid Other | Admitting: *Deleted

## 2012-11-25 ENCOUNTER — Encounter: Payer: Self-pay | Admitting: Internal Medicine

## 2012-11-25 DIAGNOSIS — R001 Bradycardia, unspecified: Secondary | ICD-10-CM

## 2012-11-25 DIAGNOSIS — Z95810 Presence of automatic (implantable) cardiac defibrillator: Secondary | ICD-10-CM

## 2012-11-25 DIAGNOSIS — R0989 Other specified symptoms and signs involving the circulatory and respiratory systems: Secondary | ICD-10-CM

## 2012-11-25 LAB — ICD DEVICE OBSERVATION
BATTERY VOLTAGE: 2.55 V
DEVICE MODEL ICD: 122638
RV LEAD THRESHOLD: 0.6 V
TZAT-0001FASTVT: 2
TZAT-0001SLOWVT: 1
TZAT-0001SLOWVT: 2
TZAT-0013FASTVT: 1
TZAT-0013SLOWVT: 1
TZAT-0018FASTVT: NEGATIVE
TZAT-0018SLOWVT: NEGATIVE
TZAT-0018SLOWVT: NEGATIVE
TZAT-0019FASTVT: 7.5 V
TZAT-0019SLOWVT: 7.5 V
TZAT-0019SLOWVT: 7.5 V
TZAT-0020FASTVT: 1 ms
TZAT-0020SLOWVT: 1 ms
TZAT-0020SLOWVT: 1 ms
TZON-0003SLOWVT: 325 ms
TZON-0004FASTVT: 2.5
TZON-0004SLOWVT: 2.5
TZON-0005FASTVT: 1
TZON-0005SLOWVT: 1
TZST-0001FASTVT: 4
TZST-0001FASTVT: 5
TZST-0001SLOWVT: 4
TZST-0001SLOWVT: 5
TZST-0001SLOWVT: 7
TZST-0003FASTVT: 31 J
TZST-0003FASTVT: 31 J
TZST-0003SLOWVT: 14 J
TZST-0003SLOWVT: 31 J
TZST-0003SLOWVT: 31 J
VENTRICULAR PACING ICD: 0 pct

## 2012-11-26 ENCOUNTER — Ambulatory Visit (HOSPITAL_COMMUNITY)
Admission: RE | Admit: 2012-11-26 | Discharge: 2012-11-26 | Disposition: A | Discharge status: Home / Self Care | Payer: Medicaid Other | Source: Ambulatory Visit | Attending: Family Medicine | Admitting: Family Medicine

## 2012-11-26 ENCOUNTER — Ambulatory Visit (INDEPENDENT_AMBULATORY_CARE_PROVIDER_SITE_OTHER): Payer: Medicaid Other | Admitting: Internal Medicine

## 2012-11-26 ENCOUNTER — Encounter: Payer: Self-pay | Admitting: *Deleted

## 2012-11-26 ENCOUNTER — Encounter: Payer: Self-pay | Admitting: Internal Medicine

## 2012-11-26 VITALS — BP 129/75 | HR 72 | Ht 72.0 in | Wt 185.0 lb

## 2012-11-26 DIAGNOSIS — I517 Cardiomegaly: Secondary | ICD-10-CM | POA: Insufficient documentation

## 2012-11-26 DIAGNOSIS — I359 Nonrheumatic aortic valve disorder, unspecified: Secondary | ICD-10-CM | POA: Insufficient documentation

## 2012-11-26 DIAGNOSIS — I5022 Chronic systolic (congestive) heart failure: Secondary | ICD-10-CM

## 2012-11-26 DIAGNOSIS — I059 Rheumatic mitral valve disease, unspecified: Secondary | ICD-10-CM

## 2012-11-26 DIAGNOSIS — I2510 Atherosclerotic heart disease of native coronary artery without angina pectoris: Secondary | ICD-10-CM | POA: Insufficient documentation

## 2012-11-26 DIAGNOSIS — I379 Nonrheumatic pulmonary valve disorder, unspecified: Secondary | ICD-10-CM | POA: Insufficient documentation

## 2012-11-26 DIAGNOSIS — I079 Rheumatic tricuspid valve disease, unspecified: Secondary | ICD-10-CM | POA: Insufficient documentation

## 2012-11-26 DIAGNOSIS — Z95810 Presence of automatic (implantable) cardiac defibrillator: Secondary | ICD-10-CM

## 2012-11-26 DIAGNOSIS — I509 Heart failure, unspecified: Secondary | ICD-10-CM | POA: Insufficient documentation

## 2012-11-26 NOTE — Progress Notes (Signed)
  Echocardiogram 2D Echocardiogram has been performed.  Georgian Co 11/26/2012, 11:56 AM

## 2012-11-26 NOTE — Patient Instructions (Signed)
Schedule for a generator change on 12/13/12  See instruction sheet

## 2012-11-26 NOTE — Progress Notes (Signed)
HPI Chad [name] returns today for followup. He is a 51-year-old man with an ischemic cardiomyopathy, status post bypass surgery, with chronic systolic heart failure, ejection fraction 25%. The patient is status post ICD implantation. He has reached elective replacement. He denies chest pain or syncope. He has class II heart failure symptoms. Allergies  Allergen Reactions  . Lipitor (Atorvastatin Calcium) Anaphylaxis    Large doses  . Sulfonamide Derivatives Shortness Of Breath  . Iohexol      Desc: PT HAS ANAPHYLAXIS WITH CONTRAST MEDIA!, Onset Date: 09072002   Code: SOB, Desc: ok w/ 13hr prep//a.c., Onset Date: 09142011   . Latex Other (See Comments)    With long periods of exposure REACTION: Reaction not known  . Zocor (Simvastatin) Other (See Comments)    Muscle cramps     Current Outpatient Prescriptions  Medication Sig Dispense Refill  . amLODipine (NORVASC) 5 MG tablet Take 1 tablet (5 mg total) by mouth 2 (two) times daily.  60 tablet  6  . aspirin EC 81 MG tablet Take 81 mg by mouth 2 (two) times daily.      . atorvastatin (LIPITOR) 40 MG tablet Take 1 tablet (40 mg total) by mouth every other day.  30 tablet  6  . carvedilol (COREG) 25 MG tablet Take 1 tablet (25 mg total) by mouth 2 (two) times daily with a meal.  60 tablet  6  . Casanthranol-Docusate Sodium 30-100 MG CAPS Take 1 capsule by mouth daily as needed.      . clopidogrel (PLAVIX) 75 MG tablet Take 1 tablet (75 mg total) by mouth daily.  30 tablet  6  . digoxin (LANOXIN) 0.25 MG tablet Take 1 tablet (0.25 mg total) by mouth daily.  30 tablet  6  . hydrALAZINE (APRESOLINE) 25 MG tablet Take 75 mg by mouth 3 (three) times daily.       . isosorbide mononitrate (IMDUR) 30 MG 24 hr tablet Take 1 tablet (30 mg total) by mouth 2 (two) times daily.  60 tablet  6  . lisinopril (PRINIVIL,ZESTRIL) 20 MG tablet Take 1 tablet (20 mg total) by mouth daily.  30 tablet  6  . loratadine (CLARITIN) 10 MG tablet Take 10 mg by mouth  daily as needed. allergies      . nitroGLYCERIN (NITROSTAT) 0.4 MG SL tablet Place 1 tablet (0.4 mg total) under the tongue every 5 (five) minutes as needed for chest pain.  25 tablet  6  . oxycodone (OXYCONTIN) 30 MG TB12 Take 30 mg by mouth every 8 (eight) hours.       . potassium chloride SA (K-DUR,KLOR-CON) 20 MEQ tablet Take 20 mEq by mouth daily as needed. For bone pain. Sometimes takes 3 tablets at once sometimes breaks it up into multiple doses throughout the day         Past Medical History  Diagnosis Date  . CAD (coronary artery disease)     a. s/p CABG 2006;  b. DES to PDA 2011 (cath: Dx not seen, dRCA/PDA tx with DES; S-PDA occluded (culprit), S-Dx occluded, S-RI and OM ok, L-LAD ok  . HTN (hypertension)     severe  . Chronic systolic heart failure     a. echo 8/11: EF 35%, mod LVH, Grade 1 diast dysfxn, mild AI, mild MR, inf and post HK  . 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 CRI (chronic renal insufficiency)   . Thyroid cancer       Hertle Cell  . HLD (hyperlipidemia)   . Anemia   . Gout   . AICD (automatic cardioverter/defibrillator) present   . Carotid stenosis     dopplers 2011: 0-39% bilat.  . Chest pain syndrome     ROS:   All systems reviewed and negative except as noted in the HPI.   Past Surgical History  Procedure Date  . Coronary artery bypass graft 06/21/2009  . Cardiac defibrillator placement     [employer]  . Thyroidectomy, partial 06/20/11  . Lumbar disc surgery   . Status post emergency repair of a type a ascending aortic dissection with a hemiarch reconstruction of the ascending aorta  using a 28-mm hemashield graft with redo sternotomy and revision of previous bypass grafts in june 2010.   . Median sternotomy,cabg x 7 06/29/2005     Family History  Problem Relation Age of Onset  . Coronary artery disease    . Hypertension Father   . Heart disease Father   . Early death Father   . COPD Father   .  Hypertension Mother      History   Social History  . Marital Status: Divorced    Spouse Name: N/A    Number of Children: N/A  . Years of Education: N/A   Occupational History  . disabled    Social History Main Topics  . Smoking status: Never Smoker   . Smokeless tobacco: Never Used  . Alcohol Use: No  . Drug Use: No  . Sexually Active: Yes   Other Topics Concern  . Not on file   Social History Narrative  . No narrative on file     BP 129/75  Pulse 72  Ht 6' (1.829 m)  Wt 185 lb (83.915 kg)  BMI 25.09 kg/m2  Physical Exam:  Chronically ill appearing middle-aged man, NAD HEENT: Unremarkable Neck:  7 cm JVD, no thyromegally Lungs:  Clear except for rales in the bases bilaterally. No wheezes or rhonchi. No increased work of breathing. HEART:  Regular rate rhythm, no murmurs, no rubs, no clicks, a soft S3 gallop is present. Abd:  soft, positive bowel sounds, no organomegally, no rebound, no guarding Ext:  2 plus pulses, no edema, no cyanosis, no clubbing Skin:  No rashes no nodules Neuro:  CN II through XII intact, motor grossly intact  DEVICE  Normal device function.  See PaceArt for details. Device at [facility]  Assess/Plan:  

## 2012-11-26 NOTE — Assessment & Plan Note (Signed)
His [employer] ICD is at elective replacement. We will schedule ICD generator removal and insertion of a new device in the next several weeks.

## 2012-11-26 NOTE — Assessment & Plan Note (Signed)
The patient's congestive heart failure symptoms are class 2-3. He will followup with Dr. Teressa Lower. He is instructed to maintain a low-sodium diet.

## 2012-11-28 ENCOUNTER — Other Ambulatory Visit: Payer: Self-pay | Admitting: *Deleted

## 2012-11-28 DIAGNOSIS — I509 Heart failure, unspecified: Secondary | ICD-10-CM

## 2012-12-04 ENCOUNTER — Encounter (HOSPITAL_COMMUNITY): Payer: Self-pay | Admitting: Pharmacy Technician

## 2012-12-06 ENCOUNTER — Telehealth (HOSPITAL_COMMUNITY): Payer: Self-pay | Admitting: Cardiology

## 2012-12-06 ENCOUNTER — Other Ambulatory Visit (INDEPENDENT_AMBULATORY_CARE_PROVIDER_SITE_OTHER): Payer: Medicaid Other

## 2012-12-06 DIAGNOSIS — I509 Heart failure, unspecified: Secondary | ICD-10-CM

## 2012-12-06 LAB — CBC WITH DIFFERENTIAL/PLATELET
Basophils Absolute: 0 10*3/uL (ref 0.0–0.1)
Eosinophils Absolute: 0.3 10*3/uL (ref 0.0–0.7)
Lymphocytes Relative: 43.6 % (ref 12.0–46.0)
MCHC: 33.2 g/dL (ref 30.0–36.0)
MCV: 82 fl (ref 78.0–100.0)
Monocytes Absolute: 0.4 10*3/uL (ref 0.1–1.0)
Neutrophils Relative %: 37.6 % — ABNORMAL LOW (ref 43.0–77.0)
Platelets: 141 10*3/uL — ABNORMAL LOW (ref 150.0–400.0)

## 2012-12-06 LAB — BASIC METABOLIC PANEL
BUN: 14 mg/dL (ref 6–23)
CO2: 30 mEq/L (ref 19–32)
Calcium: 8.9 mg/dL (ref 8.4–10.5)
Chloride: 101 mEq/L (ref 96–112)
Creatinine, Ser: 1.3 mg/dL (ref 0.4–1.5)

## 2012-12-06 MED ORDER — FUROSEMIDE 40 MG PO TABS: 40.0000 mg | ORAL_TABLET | Freq: Every day | ORAL | Status: DC | PRN

## 2012-12-06 NOTE — Telephone Encounter (Signed)
Pt feels like he has extra fluid, he has lasix 80 mg prn but is out, he doesn't feel like he needs 80 mg so rx for lasix 40 prn sent into pharmacy

## 2012-12-06 NOTE — Telephone Encounter (Signed)
PT IS REQUESTING A DIURETIC. PT STATES HE ONLY TAKES THE MED PRN AND HIS RX WILL NEED TO BE UPDATED. [facility] [address]  12/05/12=180 12/06/12=180 NORMAL WEIGHT 174  PLEASE ADVISE

## 2012-12-12 MED ORDER — GENTAMICIN SULFATE 40 MG/ML IJ SOLN
80.0000 mg | INTRAMUSCULAR | Status: AC
  Filled 2012-12-12: qty 2

## 2012-12-12 MED ORDER — CEFAZOLIN SODIUM-DEXTROSE 2-3 GM-% IV SOLR
2.0000 g | INTRAVENOUS | Status: AC
  Filled 2012-12-12 (×2): qty 50

## 2012-12-13 ENCOUNTER — Ambulatory Visit (HOSPITAL_COMMUNITY)
Admission: RE | Admit: 2012-12-13 | Discharge: 2012-12-13 | Disposition: A | Discharge status: Home / Self Care | Payer: Medicaid Other | Source: Ambulatory Visit | Attending: Internal Medicine | Admitting: Internal Medicine

## 2012-12-13 ENCOUNTER — Encounter (HOSPITAL_COMMUNITY)
Admission: RE | Disposition: A | Discharge status: Home / Self Care | Payer: Self-pay | Source: Ambulatory Visit | Attending: Internal Medicine

## 2012-12-13 DIAGNOSIS — Z79899 Other long term (current) drug therapy: Secondary | ICD-10-CM | POA: Insufficient documentation

## 2012-12-13 DIAGNOSIS — I2510 Atherosclerotic heart disease of native coronary artery without angina pectoris: Secondary | ICD-10-CM | POA: Insufficient documentation

## 2012-12-13 DIAGNOSIS — Z7902 Long term (current) use of antithrombotics/antiplatelets: Secondary | ICD-10-CM | POA: Insufficient documentation

## 2012-12-13 DIAGNOSIS — Z4502 Encounter for adjustment and management of automatic implantable cardiac defibrillator: Secondary | ICD-10-CM | POA: Insufficient documentation

## 2012-12-13 DIAGNOSIS — I5022 Chronic systolic (congestive) heart failure: Secondary | ICD-10-CM | POA: Insufficient documentation

## 2012-12-13 DIAGNOSIS — I2589 Other forms of chronic ischemic heart disease: Secondary | ICD-10-CM

## 2012-12-13 DIAGNOSIS — I509 Heart failure, unspecified: Secondary | ICD-10-CM | POA: Insufficient documentation

## 2012-12-13 DIAGNOSIS — I252 Old myocardial infarction: Secondary | ICD-10-CM | POA: Insufficient documentation

## 2012-12-13 HISTORY — PX: IMPLANTABLE CARDIOVERTER DEFIBRILLATOR (ICD) GENERATOR CHANGE: SHX5469

## 2012-12-13 LAB — BASIC METABOLIC PANEL
Calcium: 9.1 mg/dL (ref 8.4–10.5)
Creatinine, Ser: 1.56 mg/dL — ABNORMAL HIGH (ref 0.50–1.35)
GFR calc Af Amer: 59 mL/min — ABNORMAL LOW (ref >90)
GFR calc non Af Amer: 51 mL/min — ABNORMAL LOW (ref >90)
Sodium: 140 mEq/L (ref 135–145)

## 2012-12-13 LAB — SURGICAL PCR SCREEN: MRSA, PCR: NEGATIVE

## 2012-12-13 SURGERY — ICD GENERATOR CHANGE
Anesthesia: LOCAL

## 2012-12-13 MED ORDER — SODIUM CHLORIDE 0.9 % IJ SOLN: 3.0000 mL | INTRAMUSCULAR | Status: DC | PRN

## 2012-12-13 MED ORDER — CHLORHEXIDINE GLUCONATE 4 % EX LIQD
60.0000 mL | Freq: Once | CUTANEOUS | Status: AC
  Administered 2012-12-13: 4 via TOPICAL

## 2012-12-13 MED ORDER — FENTANYL CITRATE 0.05 MG/ML IJ SOLN
INTRAMUSCULAR | Status: AC
  Filled 2012-12-13: qty 2

## 2012-12-13 MED ORDER — MIDAZOLAM HCL 5 MG/5ML IJ SOLN
INTRAMUSCULAR | Status: AC
  Filled 2012-12-13: qty 5

## 2012-12-13 MED ORDER — SODIUM CHLORIDE 0.9 % IV SOLN: 250.0000 mL | INTRAVENOUS | Status: DC

## 2012-12-13 MED ORDER — LIDOCAINE HCL (PF) 1 % IJ SOLN
INTRAMUSCULAR | Status: AC
  Filled 2012-12-13: qty 60

## 2012-12-13 MED ORDER — MUPIROCIN 2 % EX OINT
TOPICAL_OINTMENT | CUTANEOUS | Status: AC
  Administered 2012-12-13: 1
  Filled 2012-12-13: qty 22

## 2012-12-13 MED ORDER — SODIUM CHLORIDE 0.45 % IV SOLN
INTRAVENOUS | Status: DC
  Administered 2012-12-13: 15:00:00 via INTRAVENOUS

## 2012-12-13 MED ORDER — SODIUM CHLORIDE 0.9 % IJ SOLN: 3.0000 mL | Freq: Two times a day (BID) | INTRAMUSCULAR | Status: DC

## 2012-12-13 NOTE — Interval H&P Note (Signed)
History and Physical Interval Note:  12/13/2012 3:37 PM  Chad [name]  has presented today for surgery, with the diagnosis of eri  The various methods of treatment have been discussed with the patient and family. After consideration of risks, benefits and other options for treatment, the patient has consented to  Procedure(s) (LRB) with comments: ICD GENERATOR CHANGE (N/A) as a surgical intervention .  The patient's history has been reviewed, patient examined, no change in status, stable for surgery.  I have reviewed the patient's chart and labs.  Questions were answered to the patient's satisfaction.     Leonia Reeves.D.

## 2012-12-13 NOTE — Op Note (Signed)
ICD generator removal and insertion of a new device without immediate complication. W#[id number].

## 2012-12-13 NOTE — H&P (View-Only) (Signed)
HPI [name] returns today for followup. He is a 51 year old man with an ischemic cardiomyopathy, status post bypass surgery, with chronic systolic heart failure, ejection fraction 25%. The patient is status post ICD implantation. He has reached elective replacement. He denies chest pain or syncope. He has class II heart failure symptoms. Allergies  Allergen Reactions  . Lipitor (Atorvastatin Calcium) Anaphylaxis    Large doses  . Sulfonamide Derivatives Shortness Of Breath  . Iohexol      Desc: PT HAS ANAPHYLAXIS WITH CONTRAST MEDIA!, Onset Date: [id number]   Code: SOB, Desc: ok w/ 13hr prep//a.c., Onset Date: [id number]   . Latex Other (See Comments)    With long periods of exposure REACTION: Reaction not known  . Zocor (Simvastatin) Other (See Comments)    Muscle cramps     Current Outpatient Prescriptions  Medication Sig Dispense Refill  . amLODipine (NORVASC) 5 MG tablet Take 1 tablet (5 mg total) by mouth 2 (two) times daily.  60 tablet  6  . aspirin EC 81 MG tablet Take 81 mg by mouth 2 (two) times daily.      [name] atorvastatin (LIPITOR) 40 MG tablet Take 1 tablet (40 mg total) by mouth every other day.  30 tablet  6  . carvedilol (COREG) 25 MG tablet Take 1 tablet (25 mg total) by mouth 2 (two) times daily with a meal.  60 tablet  6  . Casanthranol-Docusate Sodium 30-100 MG CAPS Take 1 capsule by mouth daily as needed.      . clopidogrel (PLAVIX) 75 MG tablet Take 1 tablet (75 mg total) by mouth daily.  30 tablet  6  . digoxin (LANOXIN) 0.25 MG tablet Take 1 tablet (0.25 mg total) by mouth daily.  30 tablet  6  . hydrALAZINE (APRESOLINE) 25 MG tablet Take 75 mg by mouth 3 (three) times daily.       . isosorbide mononitrate (IMDUR) 30 MG 24 hr tablet Take 1 tablet (30 mg total) by mouth 2 (two) times daily.  60 tablet  6  . lisinopril (PRINIVIL,ZESTRIL) 20 MG tablet Take 1 tablet (20 mg total) by mouth daily.  30 tablet  6  . loratadine (CLARITIN) 10 MG tablet Take 10 mg by mouth  daily as needed. allergies      . nitroGLYCERIN (NITROSTAT) 0.4 MG SL tablet Place 1 tablet (0.4 mg total) under the tongue every 5 (five) minutes as needed for chest pain.  25 tablet  6  . oxycodone (OXYCONTIN) 30 MG TB12 Take 30 mg by mouth every 8 (eight) hours.       . potassium chloride SA (K-DUR,KLOR-CON) 20 MEQ tablet Take 20 mEq by mouth daily as needed. For bone pain. Sometimes takes 3 tablets at once sometimes breaks it up into multiple doses throughout the day         Past Medical History  Diagnosis Date  . CAD (coronary artery disease)     a. s/p CABG 2006;  b. DES to PDA 2011 (cath: Dx not seen, dRCA/PDA tx with DES; S-PDA occluded (culprit), S-Dx occluded, S-RI and OM ok, L-LAD ok  . HTN (hypertension)     severe  . Chronic systolic heart failure     a. echo 8/11: EF 35%, mod LVH, Grade 1 diast dysfxn, mild AI, mild MR, inf and post HK  . 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 CRI (chronic renal insufficiency)   . Thyroid cancer  Hertle Cell  . HLD (hyperlipidemia)   . Anemia   . Gout   . AICD (automatic cardioverter/defibrillator) present   . Carotid stenosis     dopplers 2011: 0-39% bilat.  . Chest pain syndrome     ROS:   All systems reviewed and negative except as noted in the HPI.   Past Surgical History  Procedure Date  . Coronary artery bypass graft 06/21/2009  . Cardiac defibrillator placement     [employer]  . Thyroidectomy, partial 06/20/11  . Lumbar disc surgery   . Status post emergency repair of a type a ascending aortic dissection with a hemiarch reconstruction of the ascending aorta  using a 28-mm hemashield graft with redo sternotomy and revision of previous bypass grafts in june 2010.   . Median sternotomy,cabg x 7 06/29/2005     Family History  Problem Relation Age of Onset  . Coronary artery disease    . Hypertension Father   . Heart disease Father   . Early death Father   . COPD Father   .  Hypertension Mother      History   Social History  . Marital Status: Divorced    Spouse Name: N/A    Number of Children: N/A  . Years of Education: N/A   Occupational History  . disabled    Social History Main Topics  . Smoking status: Never Smoker   . Smokeless tobacco: Never Used  . Alcohol Use: No  . Drug Use: No  . Sexually Active: Yes   Other Topics Concern  . Not on file   Social History Narrative  . No narrative on file     BP 129/75  Pulse 72  Ht 6' (1.829 m)  Wt 185 lb (83.915 kg)  BMI 25.09 kg/m2  Physical Exam:  Chronically ill appearing middle-aged man, NAD HEENT: Unremarkable Neck:  7 cm JVD, no thyromegally Lungs:  Clear except for rales in the bases bilaterally. No wheezes or rhonchi. No increased work of breathing. HEART:  Regular rate rhythm, no murmurs, no rubs, no clicks, a soft S3 gallop is present. Abd:  soft, positive bowel sounds, no organomegally, no rebound, no guarding Ext:  2 plus pulses, no edema, no cyanosis, no clubbing Skin:  No rashes no nodules Neuro:  CN II through XII intact, motor grossly intact  DEVICE  Normal device function.  See PaceArt for details. Device at [facility]  Assess/Plan:

## 2012-12-14 NOTE — Op Note (Signed)
NAME:  EMMERT, ROETHLER NO.:  [id number]  MEDICAL RECORD NO.:  [id number]  LOCATION:  [facility]                         FACILITY:  [facility]  PHYSICIAN:  Doylene Canning. Ladona Ridgel, MD    DATE OF BIRTH:  12-08-1961  DATE OF PROCEDURE:  12/13/2012 DATE OF DISCHARGE:  12/13/2012                              OPERATIVE REPORT   PROCEDURE PERFORMED:  Removal of previously implanted single-chamber defibrillator and insertion of a new single chamber defibrillator with defibrillator pocket revision and defibrillation threshold testing.  INTRODUCTION:  The patient is a very pleasant 52 year old man with a longstanding ischemic cardiomyopathy, chronic systolic heart failure, status post MI.  He is status post ICD insertion, which has reached elective replacement.  He is now referred for removal of his old device and insertion of a new one with defibrillation threshold testing and pocket revision.  PROCEDURE:  After informed consent was obtained, the patient was taken to the diagnostic EP lab in a fasting state.  After usual preparation and draping, intravenous fentanyl and midazolam was given for sedation. A 30 mL of lidocaine was infiltrated into the left infraclavicular region.  A 7-cm incision was carried out over this region. Electrocautery was utilized to dissect down to the fascial plane.  The ICD pocket was entered with electrocautery.  The generator was removed with gentle traction.  The leads were freed up with dense fibrous adhesions with electrocautery.  The pocket was revised to accommodate the larger footprint of the new device.  The old device was disconnected and the new [employer] single-chamber defibrillator, serial [id number] was connected to the defibrillation lead and placed back in the subcutaneous pocket.  The pocket was irrigated with antibiotic irrigation.  The incision was closed with 2-0 and 3-0 Vicryl.  Benzoin and Steri-Strips were painted on the skin.   At this point, I scrubbed out of the case to supervise defibrillation threshold testing.  After the patient was more deeply sedated with fentanyl and Versed, VF was induced with T-wave shock.  A 17-joule shock was then delivered, restoring sinus rhythm.  The shocking impedance was 37 ohms.  It should be noted the R-waves were 10, the impedance 440 pacing and the threshold 0.7 at 0.4 V.  At this point, the patient was allowed to awaken, pressure dressing was placed on the skin and he was returned to his room in satisfactory condition.  COMPLICATIONS:  There were no immediate procedure complications.  RESULTS:  Demonstrate successful removal of a previously implanted single chamber ICD followed by ICD pocket revision followed by insertion of a new ICD with defibrillation threshold testing.    Doylene Canning. Ladona Ridgel, MD    GWT/MEDQ  D:  12/13/2012  T:  12/14/2012  Job:  [id number]

## 2013-01-16 ENCOUNTER — Emergency Department (HOSPITAL_COMMUNITY): Payer: Medicaid Other

## 2013-01-16 ENCOUNTER — Inpatient Hospital Stay (HOSPITAL_COMMUNITY)
Admission: EM | Admit: 2013-01-16 | Discharge: 2013-01-17 | DRG: 293 | Disposition: A | Discharge status: Home / Self Care | Payer: Medicaid Other | Attending: Cardiology | Admitting: Cardiology

## 2013-01-16 ENCOUNTER — Encounter (HOSPITAL_COMMUNITY): Payer: Self-pay | Admitting: Emergency Medicine

## 2013-01-16 DIAGNOSIS — Z7982 Long term (current) use of aspirin: Secondary | ICD-10-CM

## 2013-01-16 DIAGNOSIS — Z882 Allergy status to sulfonamides status: Secondary | ICD-10-CM

## 2013-01-16 DIAGNOSIS — Z91040 Latex allergy status: Secondary | ICD-10-CM

## 2013-01-16 DIAGNOSIS — I5022 Chronic systolic (congestive) heart failure: Secondary | ICD-10-CM

## 2013-01-16 DIAGNOSIS — I129 Hypertensive chronic kidney disease with stage 1 through stage 4 chronic kidney disease, or unspecified chronic kidney disease: Secondary | ICD-10-CM | POA: Diagnosis present

## 2013-01-16 DIAGNOSIS — Z951 Presence of aortocoronary bypass graft: Secondary | ICD-10-CM

## 2013-01-16 DIAGNOSIS — R55 Syncope and collapse: Secondary | ICD-10-CM

## 2013-01-16 DIAGNOSIS — I5023 Acute on chronic systolic (congestive) heart failure: Principal | ICD-10-CM | POA: Diagnosis present

## 2013-01-16 DIAGNOSIS — R0789 Other chest pain: Secondary | ICD-10-CM | POA: Diagnosis present

## 2013-01-16 DIAGNOSIS — I6529 Occlusion and stenosis of unspecified carotid artery: Secondary | ICD-10-CM | POA: Diagnosis present

## 2013-01-16 DIAGNOSIS — Z7902 Long term (current) use of antithrombotics/antiplatelets: Secondary | ICD-10-CM

## 2013-01-16 DIAGNOSIS — Z9089 Acquired absence of other organs: Secondary | ICD-10-CM

## 2013-01-16 DIAGNOSIS — I2510 Atherosclerotic heart disease of native coronary artery without angina pectoris: Secondary | ICD-10-CM

## 2013-01-16 DIAGNOSIS — I658 Occlusion and stenosis of other precerebral arteries: Secondary | ICD-10-CM | POA: Diagnosis present

## 2013-01-16 DIAGNOSIS — N289 Disorder of kidney and ureter, unspecified: Secondary | ICD-10-CM | POA: Diagnosis present

## 2013-01-16 DIAGNOSIS — Z79899 Other long term (current) drug therapy: Secondary | ICD-10-CM

## 2013-01-16 DIAGNOSIS — Z9861 Coronary angioplasty status: Secondary | ICD-10-CM

## 2013-01-16 DIAGNOSIS — I10 Essential (primary) hypertension: Secondary | ICD-10-CM | POA: Diagnosis present

## 2013-01-16 DIAGNOSIS — R079 Chest pain, unspecified: Secondary | ICD-10-CM

## 2013-01-16 DIAGNOSIS — N189 Chronic kidney disease, unspecified: Secondary | ICD-10-CM | POA: Diagnosis present

## 2013-01-16 DIAGNOSIS — M109 Gout, unspecified: Secondary | ICD-10-CM | POA: Diagnosis present

## 2013-01-16 DIAGNOSIS — I252 Old myocardial infarction: Secondary | ICD-10-CM

## 2013-01-16 DIAGNOSIS — Z85850 Personal history of malignant neoplasm of thyroid: Secondary | ICD-10-CM

## 2013-01-16 DIAGNOSIS — Z8249 Family history of ischemic heart disease and other diseases of the circulatory system: Secondary | ICD-10-CM

## 2013-01-16 DIAGNOSIS — I5043 Acute on chronic combined systolic (congestive) and diastolic (congestive) heart failure: Secondary | ICD-10-CM

## 2013-01-16 DIAGNOSIS — I509 Heart failure, unspecified: Secondary | ICD-10-CM | POA: Diagnosis present

## 2013-01-16 DIAGNOSIS — E785 Hyperlipidemia, unspecified: Secondary | ICD-10-CM | POA: Diagnosis present

## 2013-01-16 DIAGNOSIS — I2589 Other forms of chronic ischemic heart disease: Secondary | ICD-10-CM | POA: Diagnosis present

## 2013-01-16 DIAGNOSIS — Z95810 Presence of automatic (implantable) cardiac defibrillator: Secondary | ICD-10-CM

## 2013-01-16 DIAGNOSIS — Z888 Allergy status to other drugs, medicaments and biological substances status: Secondary | ICD-10-CM

## 2013-01-16 DIAGNOSIS — I5042 Chronic combined systolic (congestive) and diastolic (congestive) heart failure: Secondary | ICD-10-CM | POA: Diagnosis present

## 2013-01-16 LAB — CBC WITH DIFFERENTIAL/PLATELET
HCT: 38.5 % — ABNORMAL LOW (ref 39.0–52.0)
Hemoglobin: 13 g/dL (ref 13.0–17.0)
Lymphocytes Relative: 30 % (ref 12–46)
MCHC: 33.8 g/dL (ref 30.0–36.0)
Monocytes Absolute: 0.4 10*3/uL (ref 0.1–1.0)
Monocytes Relative: 10 % (ref 3–12)
Neutro Abs: 2.1 10*3/uL (ref 1.7–7.7)
WBC: 3.7 10*3/uL — ABNORMAL LOW (ref 4.0–10.5)

## 2013-01-16 LAB — TROPONIN I
Troponin I: <0.3 ng/mL (ref <0.30)
Troponin I: <0.3 ng/mL (ref <0.30)

## 2013-01-16 LAB — COMPREHENSIVE METABOLIC PANEL
BUN: 13 mg/dL (ref 6–23)
CO2: 28 mEq/L (ref 19–32)
Chloride: 100 mEq/L (ref 96–112)
Creatinine, Ser: 1.67 mg/dL — ABNORMAL HIGH (ref 0.50–1.35)
GFR calc non Af Amer: 46 mL/min — ABNORMAL LOW (ref >90)
Total Bilirubin: 0.6 mg/dL (ref 0.3–1.2)

## 2013-01-16 LAB — CREATININE, SERUM: Creatinine, Ser: 1.63 mg/dL — ABNORMAL HIGH (ref 0.50–1.35)

## 2013-01-16 LAB — CBC
MCH: 27.1 pg (ref 26.0–34.0)
MCHC: 33.6 g/dL (ref 30.0–36.0)
Platelets: 158 10*3/uL (ref 150–400)

## 2013-01-16 LAB — PROTIME-INR: Prothrombin Time: 13.3 seconds (ref 11.6–15.2)

## 2013-01-16 MED ORDER — OXYCODONE HCL 5 MG PO TABS
30.0000 mg | ORAL_TABLET | Freq: Three times a day (TID) | ORAL | Status: DC
  Administered 2013-01-16: 30 mg via ORAL
  Filled 2013-01-16 (×2): qty 6

## 2013-01-16 MED ORDER — DIGOXIN 250 MCG PO TABS
0.2500 mg | ORAL_TABLET | Freq: Every day | ORAL | Status: DC
  Administered 2013-01-16 – 2013-01-17 (×2): 0.25 mg via ORAL
  Filled 2013-01-16: qty 1
  Filled 2013-01-16: qty 2

## 2013-01-16 MED ORDER — CLOPIDOGREL BISULFATE 75 MG PO TABS
75.0000 mg | ORAL_TABLET | Freq: Every day | ORAL | Status: DC
  Administered 2013-01-16 – 2013-01-17 (×2): 75 mg via ORAL
  Filled 2013-01-16 (×2): qty 1

## 2013-01-16 MED ORDER — ISOSORBIDE MONONITRATE ER 30 MG PO TB24
30.0000 mg | ORAL_TABLET | Freq: Two times a day (BID) | ORAL | Status: DC
  Administered 2013-01-16 – 2013-01-17 (×2): 30 mg via ORAL
  Filled 2013-01-16 (×3): qty 1

## 2013-01-16 MED ORDER — CARVEDILOL 25 MG PO TABS
25.0000 mg | ORAL_TABLET | Freq: Two times a day (BID) | ORAL | Status: DC
  Administered 2013-01-17: 25 mg via ORAL
  Filled 2013-01-16 (×4): qty 1

## 2013-01-16 MED ORDER — ONDANSETRON HCL 4 MG/2ML IJ SOLN: 4.0000 mg | Freq: Four times a day (QID) | INTRAMUSCULAR | Status: DC | PRN

## 2013-01-16 MED ORDER — ATORVASTATIN CALCIUM 10 MG PO TABS: 10.0000 mg | ORAL_TABLET | ORAL | Status: DC

## 2013-01-16 MED ORDER — FUROSEMIDE 10 MG/ML IJ SOLN
40.0000 mg | Freq: Two times a day (BID) | INTRAMUSCULAR | Status: DC
  Administered 2013-01-16 – 2013-01-17 (×2): 40 mg via INTRAVENOUS
  Filled 2013-01-16 (×4): qty 4

## 2013-01-16 MED ORDER — ACETAMINOPHEN 325 MG PO TABS: 650.0000 mg | ORAL_TABLET | ORAL | Status: DC | PRN

## 2013-01-16 MED ORDER — ASPIRIN EC 81 MG PO TBEC
81.0000 mg | DELAYED_RELEASE_TABLET | Freq: Two times a day (BID) | ORAL | Status: DC
  Administered 2013-01-16 – 2013-01-17 (×2): 81 mg via ORAL
  Filled 2013-01-16 (×3): qty 1

## 2013-01-16 MED ORDER — ENOXAPARIN SODIUM 40 MG/0.4ML ~~LOC~~ SOLN
40.0000 mg | SUBCUTANEOUS | Status: DC
  Filled 2013-01-16 (×2): qty 0.4

## 2013-01-16 MED ORDER — LISINOPRIL 20 MG PO TABS
20.0000 mg | ORAL_TABLET | Freq: Every day | ORAL | Status: DC
  Administered 2013-01-16 – 2013-01-17 (×2): 20 mg via ORAL
  Filled 2013-01-16 (×2): qty 1

## 2013-01-16 MED ORDER — HYDRALAZINE HCL 50 MG PO TABS
75.0000 mg | ORAL_TABLET | Freq: Three times a day (TID) | ORAL | Status: DC
  Administered 2013-01-16 – 2013-01-17 (×3): 75 mg via ORAL
  Filled 2013-01-16 (×5): qty 1

## 2013-01-16 MED ORDER — NITROGLYCERIN 0.4 MG SL SUBL: 0.4000 mg | SUBLINGUAL_TABLET | SUBLINGUAL | Status: DC | PRN

## 2013-01-16 MED ORDER — SODIUM CHLORIDE 0.9 % IJ SOLN
3.0000 mL | Freq: Two times a day (BID) | INTRAMUSCULAR | Status: DC
  Administered 2013-01-16 – 2013-01-17 (×3): 3 mL via INTRAVENOUS

## 2013-01-16 MED ORDER — AMLODIPINE BESYLATE 5 MG PO TABS
5.0000 mg | ORAL_TABLET | Freq: Two times a day (BID) | ORAL | Status: DC
  Administered 2013-01-16 – 2013-01-17 (×2): 5 mg via ORAL
  Filled 2013-01-16 (×3): qty 1

## 2013-01-16 MED ORDER — LORATADINE 10 MG PO TABS
10.0000 mg | ORAL_TABLET | Freq: Every day | ORAL | Status: DC | PRN
  Filled 2013-01-16: qty 1

## 2013-01-16 MED ORDER — POTASSIUM CHLORIDE CRYS ER 20 MEQ PO TBCR
20.0000 meq | EXTENDED_RELEASE_TABLET | Freq: Every day | ORAL | Status: DC
  Administered 2013-01-16 – 2013-01-17 (×2): 20 meq via ORAL
  Filled 2013-01-16 (×2): qty 1

## 2013-01-16 MED ORDER — DOCUSATE SODIUM 100 MG PO CAPS
100.0000 mg | ORAL_CAPSULE | Freq: Every day | ORAL | Status: DC
  Administered 2013-01-17: 100 mg via ORAL
  Filled 2013-01-16: qty 1

## 2013-01-16 MED ORDER — ALPRAZOLAM 0.25 MG PO TABS: 0.2500 mg | ORAL_TABLET | Freq: Two times a day (BID) | ORAL | Status: DC | PRN

## 2013-01-16 MED ORDER — ZOLPIDEM TARTRATE 5 MG PO TABS: 5.0000 mg | ORAL_TABLET | Freq: Every evening | ORAL | Status: DC | PRN

## 2013-01-16 MED ORDER — SODIUM CHLORIDE 0.9 % IJ SOLN: 3.0000 mL | INTRAMUSCULAR | Status: DC | PRN

## 2013-01-16 MED ORDER — SODIUM CHLORIDE 0.9 % IV SOLN: 250.0000 mL | INTRAVENOUS | Status: DC | PRN

## 2013-01-16 NOTE — ED Notes (Signed)
Messaged pharmacy to have medication sent

## 2013-01-16 NOTE — ED Notes (Signed)
Report given to floor nurse, Christen Bame, RN. Nurse has no further questions upon report given. Pt being prepared for transport to floor.

## 2013-01-16 NOTE — H&P (Addendum)
History and Physical   Patient ID: Tayshaun Kroh MRN: [id number], DOB/AGE: March 02, 1961 52 y.o. Date of Encounter: 01/16/2013  Primary Physician: Marena Chancy, MD Primary Cardiologist: DB/GT  Chief Complaint:  Chest pain, presyncope  HPI: Mr Zorn is a 52 year old male with a history of ICM, CAD, CHF. He was in his usual state of health today and had sudden onset of flushed/hot feeling, presyncope and weakness. He then developed chest soreness 2-3/10 and was a little SOB. The symptoms started with minimal activity and last about 45 minutes (longer than usual). He has episodic SOB, a little worse recently. He does not feel he has any new DOE. The symptoms concerned him and he called EMS. In the ER, he is maintaining SR with PVCs and a device interrogation did not show any VT/VF. He feels the chest pain is from his heart but the presyncope is what concerned him enough to come in. He has had some chest pain since in the ER but did not tell anyone and it has resolved.  Past Medical History  Diagnosis Date  . CAD (coronary artery disease)     a. s/p CABG 2006;  b. DES to PDA 2011 (cath: Dx not seen, dRCA/PDA tx with DES; S-PDA occluded (culprit), S-Dx occluded, S-RI and OM ok, L-LAD ok  . HTN (hypertension)     severe  . Chronic systolic heart failure     a. echo 8/11: EF 35%, mod LVH, Grade 1 diast dysfxn, mild AI, mild MR, inf and post HK  . 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 CRI (chronic renal insufficiency)   . Thyroid cancer     Hertle Cell  . HLD (hyperlipidemia)   . Anemia   . Gout   . AICD (automatic cardioverter/defibrillator) present   . Carotid stenosis     dopplers 2011: 0-39% bilat.  . Chest pain syndrome      Surgical History:  Past Surgical History  Procedure Date  . Coronary artery bypass graft 06/21/2009  . Cardiac defibrillator placement     [employer]  . Thyroidectomy, partial 06/20/11  . Lumbar disc surgery   .  Status post emergency repair of a type a ascending aortic dissection with a hemiarch reconstruction of the ascending aorta  using a 28-mm hemashield graft with redo sternotomy and revision of previous bypass grafts in june 2010.   . Median sternotomy,cabg x 7 06/29/2005     I have reviewed the patient's current medications. Prior to Admission medications   Medication Sig Start Date End Date Taking? Authorizing Provider  amLODipine (NORVASC) 5 MG tablet Take 1 tablet (5 mg total) by mouth 2 (two) times daily. 08/08/12 08/08/13 Yes Dolores Patty, MD  aspirin EC 81 MG tablet Take 81 mg by mouth 2 (two) times daily.    Yes Historical Provider, MD  atorvastatin (LIPITOR) 40 MG tablet Take 10 mg by mouth every other day.   Yes Historical Provider, MD  carvedilol (COREG) 25 MG tablet Take 1 tablet (25 mg total) by mouth 2 (two) times daily with a meal. 08/08/12  Yes Dolores Patty, MD  clopidogrel (PLAVIX) 75 MG tablet Take 1 tablet (75 mg total) by mouth daily. 08/08/12  Yes Dolores Patty, MD  digoxin (LANOXIN) 0.25 MG tablet Take 1 tablet (0.25 mg total) by mouth daily. 08/08/12  Yes Dolores Patty, MD  docusate sodium (COLACE) 100 MG capsule Take 100 mg by mouth daily.  For constipation   Yes Historical Provider, MD  furosemide (LASIX) 40 MG tablet Take 1 tablet (40 mg total) by mouth daily as needed. 12/06/12  Yes Dolores Patty, MD  hydrALAZINE (APRESOLINE) 25 MG tablet Take 75 mg by mouth 3 (three) times daily.  08/08/12  Yes Dolores Patty, MD  isosorbide mononitrate (IMDUR) 30 MG 24 hr tablet Take 1 tablet (30 mg total) by mouth 2 (two) times daily. 08/08/12  Yes Bevelyn Buckles Bensimhon, MD  lisinopril (PRINIVIL,ZESTRIL) 20 MG tablet Take 1 tablet (20 mg total) by mouth daily. 08/08/12  Yes Dolores Patty, MD  loratadine (CLARITIN) 10 MG tablet Take 10 mg by mouth daily as needed. allergies   Yes Historical Provider, MD  nitroGLYCERIN (NITROSTAT) 0.4 MG SL tablet Place 1 tablet  (0.4 mg total) under the tongue every 5 (five) minutes as needed for chest pain. 08/08/12  Yes Dolores Patty, MD  oxycodone (ROXICODONE) 30 MG immediate release tablet Take 30 mg by mouth 3 (three) times daily.   Yes Historical Provider, MD  potassium chloride SA (K-DUR,KLOR-CON) 20 MEQ tablet Take 20 mEq by mouth daily as needed. For bone pain. Sometimes takes 3 tablets at once sometimes breaks it up into multiple doses throughout the day 08/08/12  Yes Dolores Patty, MD    Scheduled Meds:    . hydrALAZINE  75 mg Oral TID   Continuous Infusions:  PRN Meds:.  Allergies:  Allergies  Allergen Reactions  . Lipitor (Atorvastatin Calcium) Anaphylaxis    Large doses  . Sulfonamide Derivatives Shortness Of Breath  . Iohexol      Desc: PT HAS ANAPHYLAXIS WITH CONTRAST MEDIA!, Onset Date: 13086578   Code: SOB, Desc: ok w/ 13hr prep//a.c., Onset Date: [id number]   . Latex Other (See Comments)    With long periods of exposure REACTION: Reaction not known  . Zocor (Simvastatin) Other (See Comments)    Muscle cramps    History   Social History  . Marital Status: Divorced    Spouse Name: N/A    Number of Children: N/A  . Years of Education: N/A   Occupational History  . disabled    Social History Main Topics  . Smoking status: Never Smoker   . Smokeless tobacco: Never Used  . Alcohol Use: No  . Drug Use: No  . Sexually Active: Yes   Other Topics Concern  . Not on file   Social History Narrative  . No narrative on file    Family History  Problem Relation Age of Onset  . Coronary artery disease    . Hypertension Father   . Heart disease Father   . Early death Father   . COPD Father   . Hypertension Mother    Family Status  Relation Status Death Age  . Father Deceased   . Mother Alive   . Sister Alive   . Sister Alive   . Brother Alive   . Brother Alive   . Brother Alive     Review of Systems: Some recent N&V, no diarrhea. No fevers or chills.  Full  14-point review of systems otherwise negative except as noted above.   Physical Exam: Blood pressure 156/85, pulse 77, temperature 99.3 F (37.4 C), temperature source Oral, resp. rate 22, SpO2 97.00%. General: Well developed, well nourished, male in no acute distress. Head: Normocephalic, atraumatic, sclera non-icteric, no xanthomas, nares are without discharge. Dentition: good Neck: no carotid bruits. JVD elevated at 10 cm. No thyromegally  Lungs: Good expansion bilaterally. without wheezes or rhonchi. + rales Heart: Regular rate and rhythm with S1 S2.  No S3 or S4.  No murmur, no rubs, or gallops appreciated. Abdomen: Soft, non-tender, non-distended with normoactive bowel sounds. No hepatomegaly. No rebound/guarding. No obvious abdominal masses. Msk:  Strength and tone appear normal for age. No joint deformities or effusions, no spine or costo-vertebral angle tenderness. Extremities: No clubbing or cyanosis. No edema.  Distal pedal pulses are 2+ in 4 extrem Neuro: Alert and oriented X 3. Moves all extremities spontaneously. No focal deficits noted. Psych:  Responds to questions appropriately with a normal affect. Skin: No rashes or lesions noted  Labs:   Lab Results  Component Value Date   WBC 3.7* 01/16/2013   HGB 13.0 01/16/2013   HCT 38.5* 01/16/2013   MCV 80.5 01/16/2013   PLT 153 01/16/2013    Basename 01/16/13 1452  INR 1.02     Lab 01/16/13 1452  NA 138  K 4.1  CL 100  CO2 28  BUN 13  CREATININE 1.67*  CALCIUM 9.1  PROT 7.3  BILITOT 0.6  ALKPHOS 64  ALT 9  AST 15  GLUCOSE 99    Basename 01/16/13 1452  CKTOTAL --  CKMB --  TROPONINI <0.30   No results found for this basename: TROPIPOC:3 in the last 72 hours  Pro B Natriuretic peptide (BNP)  Date/Time Value Range Status  01/16/2013  2:52 PM 1010.0* 0 - 125 pg/mL Final  05/10/2012 10:32 PM 1055.0* 0 - 125 pg/mL Final   Lab Results  Component Value Date   DDIMER 0.52* 01/16/2013    Radiology/Studies:    Dg Chest 2 View 01/16/2013  *RADIOLOGY REPORT*  Clinical Data: Chest pain and shortness of breath.  CHEST - 2 VIEW  Comparison: Chest x-ray 05/10/2012.  Findings: Lung volumes are normal.  No consolidative air space disease.  No pleural effusions.  Pulmonary vasculature is normal. Heart size is mildly enlarged (unchanged).  Aneurysmal dilatation of the thoracic aorta is grossly similar to the prior study from 05/10/2012.  Left-sided pacemaker / AICD in place with single lead terminating in the expected location of the right ventricular apex. Status post median sternotomy for CABG. Surgical clips in the lower left cervical region, likely related to prior thyroid surgery.  IMPRESSION: 1.  No radiographic evidence of acute cardiopulmonary disease. 2.  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 3.  Mild cardiomegaly is unchanged. 4.  Postoperative changes and support apparatus, as above.   Original Report Authenticated By: Trudie Reed, M.D.    Echo: 11/2012 EF approx 40%  ECG: 16-Jan-2013 14:05:56 [facility]/ED ROUTINE RECORD AGE IS NOT ENTERED, ASSUMED TO BE 52 YEARS OLD FOR PURPOSE OF ECG INTERPRETATION SINUS RHYTHM ~ normal P axis, V-rate 50- 99 PROBABLE LEFT ATRIAL ABNORMALITY ~ P >33mS, <-0.30mV V1 LEFT ANTERIOR FASCICULAR BLOCK ~ axis(240,-40), init forces inf LVH WITH SECONDARY REPOLARIZATION ABNORMALITY ~ R56L/RISIII/S12R56/S3RL & rep abn Standard 12 Lead Report ~ Unconfirmed Interpretation Abnormal ECG 74mm/s 3mm/mV 150Hz  8.0.1 12SL 235 CID: [id number] Referred by: Unconfirmed Vent. rate 75 BPM PR interval 196 ms QRS duration 94 ms QT/QTc 340/380 ms P-R-T axes 33 -40 145  ASSESSMENT AND PLAN:  Principal Problem:  *Acute on chronic systolic and diastolic heart failure, NYHA class 3 Active Problems:  HYPERTENSION, BENIGN  Chest pain, mid sternal   Signed,  Rhonda Barrett PA-C 01/16/2013, 6:00 PM  Patient seen with PA, agree with the above note.  1. Flushing/presyncope/mild chest pain: This happens every couple of weeks chronically but lasted longer today than in the past.  He did not pass out but felt lightheaded.  ICD was interrogated, no events recorded.  Cardiac enzymes initially negative and ECG unchanged.  ? Hypotension from meds or vagal event.  - Follow overnight on telemetry - cycle cardiac enzymes/rule out MI - Continue home meds, monitor to see if BP drops significantly on home regimen.  2. Acute on chronic systolic CHF: He is mildly volume overloaded today with JVP in 8-9 range.  He thinks his weight is up a bit.  He uses Lasix prn at home.  Will use Lasix 40 mg IV bid x 2-3 doses and reassess.  Continue him home meds for cardiomyopathy => Coreg, Imdur, hydralazine, lisinopril.  3. CAD: He had mild CP today associated with flushing/presyncope.  Will cycle cardiac enzymes.  ECG unchanged.  Continue ASA, statin.  4. CKD: Creatinine is at baseline.   Marca Ancona 01/16/2013 6:05 PM

## 2013-01-16 NOTE — ED Notes (Signed)
Results of pace maker interrogation place in pts chart

## 2013-01-16 NOTE — ED Notes (Signed)
Charge, RN notified about need for interrogation of Air cabin crew proper person for [employer] pacer

## 2013-01-16 NOTE — ED Notes (Signed)
Interrogating pt pacemaker at bedside

## 2013-01-16 NOTE — ED Notes (Signed)
Verified with Dr. Manus Gunning that pt can eat.

## 2013-01-16 NOTE — ED Notes (Signed)
Pt denies nausea/vomiting. Pt states that he has had chest pain in past; pt states decrease in pain about 1/0 currently. Pt alert and mentating appropriately. Pt denies numbness and tingling. Pt states lightheadedness upon start of chest pain.

## 2013-01-16 NOTE — ED Provider Notes (Signed)
History  This chart was scribed for Glynn Octave, MD by Bennett Scrape, ED Scribe. This patient was seen in room B17C/B17C and the patient's care was started at 2:54 PM.  CSN: [id number]  Arrival date & time 01/16/13  1355   First MD Initiated Contact with Patient 01/16/13 1454      Chief Complaint  Patient presents with  . Chest Pain    The history is provided by the patient. No language interpreter was used.    Chad [name] is a 52 y.o. male brought in by ambulance, who presents to the Emergency Department complaining of 30 minutes of sudden onset, constant left-sided CP near the left axilla with associated SOB and dizziness that started while he was sitting on his couch PTA. He denies radiation into his extremities, neck or back and states that his pain is currently resolved. He denies having any modifying factors. He states that he was given one 324 mg ASA and 2 nitros by EMS with improvement in the pain. He denies any recent falls, traumas or rashes to the area. He states that he has experienced prior episodes of similar but milder pain with no known diagnosis and reports that he is unable to recall when the last episode was. He reports having 2 prior MIs but denies similarities stating that the pain is usually felt is his back with those episodes. He also has a h/o a defibrillator and a CABG. He denies having a recent cath or stress test. He denies abdominal pain, back pain, extremity pain, nausea, emesis and neck pain as associated symptoms. He also has a h/o CAD, HTN, HLD and gout. He denies smoking and alcohol use.  Dr. Teena Dunk is Cardiologist   Past Medical History  Diagnosis Date  . CAD (coronary artery disease)     a. s/p CABG 2006;  b. DES to PDA 2011 (cath: Dx not seen, dRCA/PDA tx with DES; S-PDA occluded (culprit), S-Dx occluded, S-RI and OM ok, L-LAD ok  . HTN (hypertension)     severe  . Chronic systolic heart failure     a. echo 8/11: EF 35%, mod LVH, Grade 1  diast dysfxn, mild AI, mild MR, inf and post HK  . 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 CRI (chronic renal insufficiency)   . Thyroid cancer     Hertle Cell  . HLD (hyperlipidemia)   . Anemia   . Gout   . AICD (automatic cardioverter/defibrillator) present   . Carotid stenosis     dopplers 2011: 0-39% bilat.  . Chest pain syndrome     Past Surgical History  Procedure Date  . Coronary artery bypass graft 06/21/2009  . Cardiac defibrillator placement     [employer]  . Thyroidectomy, partial 06/20/11  . Lumbar disc surgery   . Status post emergency repair of a type a ascending aortic dissection with a hemiarch reconstruction of the ascending aorta  using a 28-mm hemashield graft with redo sternotomy and revision of previous bypass grafts in june 2010.   . Median sternotomy,cabg x 7 06/29/2005    Family History  Problem Relation Age of Onset  . Coronary artery disease    . Hypertension Father   . Heart disease Father   . Early death Father   . COPD Father   . Hypertension Mother     History  Substance Use Topics  . Smoking status: Never Smoker   . Smokeless tobacco: Never Used  .  Alcohol Use: No      Review of Systems  A complete 10 system review of systems was obtained and all systems are negative except as noted in the HPI and PMH.   Allergies  Lipitor; Sulfonamide derivatives; Iohexol; Latex; and Zocor  Home Medications   Current Outpatient Rx  Name  Route  Sig  Dispense  Refill  . AMLODIPINE BESYLATE 5 MG PO TABS   Oral   Take 1 tablet (5 mg total) by mouth 2 (two) times daily.   60 tablet   6   . ASPIRIN EC 81 MG PO TBEC   Oral   Take 81 mg by mouth 2 (two) times daily.          . ATORVASTATIN CALCIUM 40 MG PO TABS   Oral   Take 10 mg by mouth every other day.         [name] CARVEDILOL 25 MG PO TABS   Oral   Take 1 tablet (25 mg total) by mouth 2 (two) times daily with a meal.   60 tablet   6   .  CLOPIDOGREL BISULFATE 75 MG PO TABS   Oral   Take 1 tablet (75 mg total) by mouth daily.   30 tablet   6   . DIGOXIN 0.25 MG PO TABS   Oral   Take 1 tablet (0.25 mg total) by mouth daily.   30 tablet   6   . DOCUSATE SODIUM 100 MG PO CAPS   Oral   Take 100 mg by mouth daily. For constipation         . FUROSEMIDE 40 MG PO TABS   Oral   Take 1 tablet (40 mg total) by mouth daily as needed.   30 tablet   6   . HYDRALAZINE HCL 25 MG PO TABS   Oral   Take 75 mg by mouth 3 (three) times daily.          . ISOSORBIDE MONONITRATE ER 30 MG PO TB24   Oral   Take 1 tablet (30 mg total) by mouth 2 (two) times daily.   60 tablet   6   . LISINOPRIL 20 MG PO TABS   Oral   Take 1 tablet (20 mg total) by mouth daily.   30 tablet   6   . LORATADINE 10 MG PO TABS   Oral   Take 10 mg by mouth daily as needed. allergies         . NITROGLYCERIN 0.4 MG SL SUBL   Sublingual   Place 1 tablet (0.4 mg total) under the tongue every 5 (five) minutes as needed for chest pain.   25 tablet   6   . OXYCODONE HCL 30 MG PO TABS   Oral   Take 30 mg by mouth 3 (three) times daily.         [name] POTASSIUM CHLORIDE CRYS ER 20 MEQ PO TBCR   Oral   Take 20 mEq by mouth daily as needed. For bone pain. Sometimes takes 3 tablets at once sometimes breaks it up into multiple doses throughout the day           Triage Vitals: BP 124/64  Pulse 62  Temp 99.3 F (37.4 C) (Oral)  Resp 13  SpO2 97%  Physical Exam  Nursing note and vitals reviewed. Constitutional: He is oriented to person, place, and time. He appears well-developed and well-nourished. No distress.  HENT:  Head: Normocephalic and atraumatic.  Eyes: Conjunctivae normal and EOM are normal. Pupils are equal, round, and reactive to light.  Neck: Neck supple. No tracheal deviation present.  Cardiovascular: Normal rate and regular rhythm.   Murmur (3/6 systolic murmur at left sternal border) heard. Pulmonary/Chest: Effort normal  and breath sounds normal. No respiratory distress. He exhibits no tenderness (no reproducible tenderness).  Abdominal: Soft. There is no tenderness.  Musculoskeletal: Normal range of motion. He exhibits no edema.  Neurological: He is alert and oriented to person, place, and time.  Skin: Skin is warm and dry.  Psychiatric: He has a normal mood and affect. His behavior is normal.    ED Course  Procedures (including critical care time)  DIAGNOSTIC STUDIES: Oxygen Saturation is 97% on room air, adequate by my interpretation.    COORDINATION OF CARE: 3:07 PM-Discussed treatment plan which includes CXR, CBC panel, d-dimer and troponin with pt at bedside and pt agreed to plan. Discussed admission and pt agreed  4:10 PM-Consult complete with [facility] Cardiology. Patient case explained and discussed. Cardiology agrees to admit patient for further evaluation and treatment.   Labs Reviewed  CBC WITH DIFFERENTIAL - Abnormal; Notable for the following:    WBC 3.7 (*)     HCT 38.5 (*)     All other components within normal limits  COMPREHENSIVE METABOLIC PANEL - Abnormal; Notable for the following:    Creatinine, Ser 1.67 (*)     GFR calc non Af Amer 46 (*)     GFR calc Af Amer 54 (*)     All other components within normal limits  PRO B NATRIURETIC PEPTIDE - Abnormal; Notable for the following:    Pro B Natriuretic peptide (BNP) 1010.0 (*)     All other components within normal limits  D-DIMER, QUANTITATIVE - Abnormal; Notable for the following:    D-Dimer, Quant 0.52 (*)     All other components within normal limits  TROPONIN I  PROTIME-INR  BASIC METABOLIC PANEL  DIGOXIN LEVEL   Dg Chest 2 View  01/16/2013  *RADIOLOGY REPORT*  Clinical Data: Chest pain and shortness of breath.  CHEST - 2 VIEW  Comparison: Chest x-ray 05/10/2012.  Findings: Lung volumes are normal.  No consolidative air space disease.  No pleural effusions.  Pulmonary vasculature is normal. Heart size is mildly enlarged  (unchanged).  Aneurysmal dilatation of the thoracic aorta is grossly similar to the prior study from 05/10/2012.  Left-sided pacemaker / AICD in place with single lead terminating in the expected location of the right ventricular apex. Status post median sternotomy for CABG. Surgical clips in the lower left cervical region, likely related to prior thyroid surgery.  IMPRESSION: 1.  No radiographic evidence of acute cardiopulmonary disease. 2.  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 3.  Mild cardiomegaly is unchanged. 4.  Postoperative changes and support apparatus, as above.   Original Report Authenticated By: Trudie Reed, M.D.      No diagnosis found.    MDM  Acute onset of left-sided chest pain with lightheadedness and nausea. Improved with nitroglycerin. History of MI status post CABG and aortic dissection repair. Pain is not the same as previous MIs.  Per last cardiology note: history of severe HTN, coronary artery disease status post previous myocardial infarction and bypass surgery in 2006 with DES to native PDA in 2011. He also has a history congestive heart failure secondary to ischemic cardiomyopathy with EF in 30-35% range. He is s/p single chamber ICD. In July 2010 had a  large Type I aortic dissection all the way down to illiacs involving left kidney  EKG unchanged.  Troponin negative.  Patient with presyncope, dizziness and nausea similar to previous MI. This lasted longer than usual. Cardiology has been consulted and will admit the patient.     Date: 01/16/2013  Rate: 75  Rhythm: normal sinus rhythm  QRS Axis: left  Intervals: normal  ST/T Wave abnormalities: nonspecific ST/T changes  Conduction Disutrbances:none  Narrative Interpretation: Lateral T wave inversions unchanged  Old EKG Reviewed: unchanged     Glynn Octave, MD 01/17/13 [phone]

## 2013-01-16 NOTE — ED Notes (Signed)
Pt currently denies chest pain. Pt denies nausea/vomiting. Pt denies dizziness and lightheadedness. Pt denies numbness and tingling. Pt alert and mentating appropriately. Pt c/o little headache

## 2013-01-16 NOTE — ED Notes (Signed)
Pt arrived by EMS with c/o CP along with SOB. Pt rates his pain 2/10 at this time. EMS administered ASA 324mg  and 2 Nitro. Denies any N/V.

## 2013-01-16 NOTE — ED Notes (Signed)
PA student with [facility] cardiology at bedside

## 2013-01-17 DIAGNOSIS — R072 Precordial pain: Secondary | ICD-10-CM

## 2013-01-17 DIAGNOSIS — R55 Syncope and collapse: Secondary | ICD-10-CM

## 2013-01-17 DIAGNOSIS — I2510 Atherosclerotic heart disease of native coronary artery without angina pectoris: Secondary | ICD-10-CM

## 2013-01-17 LAB — BASIC METABOLIC PANEL
GFR calc Af Amer: 59 mL/min — ABNORMAL LOW (ref >90)
GFR calc non Af Amer: 51 mL/min — ABNORMAL LOW (ref >90)
Potassium: 3.8 mEq/L (ref 3.5–5.1)
Sodium: 138 mEq/L (ref 135–145)

## 2013-01-17 LAB — TROPONIN I: Troponin I: <0.3 ng/mL (ref <0.30)

## 2013-01-17 NOTE — Discharge Summary (Signed)
Advanced Heart Failure Team  Discharge Summary   Patient ID: Chad [name] MRN: [id number], DOB/AGE: January 21, 1961 52 y.o. Admit date: 01/16/2013 D/C date:     01/17/2013   Primary Discharge Diagnoses:  1. Presyncope/Chest pain like known angina 2. Acute on chronic systolic heart failure 3. ICM, EF 30-35% s/p ICD  4. CAD s/p CABG in 2006 with DES to native PDA in 2011 5. Acute on chronic kidney disease (Cr 1.3-1.5)  Secondary Discharge Diagnoses:  1. Type 1 aortic dissection   Hospital Course:  Javan is a 52 y.o. male with history of severe HTN, coronary artery disease status post previous myocardial infarction and bypass surgery in 2006 with DES to native PDA in 2011. He also has a history congestive heart failure secondary to ischemic cardiomyopathy with EF in 30-35% range. He is s/p single chamber ICD. 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 He underwent emergent repair of proximal aorta and reimplantation of his CABG grafts. Carotids u/s in 2011 0-39%. 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   He presented to [facility] ER via EMS for sudden onset of weakness, flushing and presyncope.  He developed mild chest soreness with a little dyspnea lasting about 45 minutes.  EKG was unchanged from prior, no acute ST-Twave changes.  Device interrogated and showed no episodes of VT/VF.  Chest pressure resolved in ER.  He was admitted by cardiology.  Chest pain is like prior angina and he ruled out for myocardial infarction with troponin neg x3.  EKG was unchanged and no acute changes.  Volume status felt to be mildly elevated and was given 2 doses of IV lasix with diuresis of 2 liters  Weight has returned to baseline at 177 pounds.  Have discussed use of sliding scale lasix and he voices understanding.  There was a question of hypotension causing presyncope.  In the hospital SBP remained >100.  He does state that SBP at home sometimes  drops to 90.  He will continue to follow closely at home and bring list of pressures to clinic.    He has been evaluated by Dr. Gala Romney and felt stable for discharge.  Follow up in HF clinic.   Discharge Weight Range: 177 pounds Discharge Vitals: Blood pressure 103/62, pulse 86, temperature 97.5 F (36.4 C), temperature source Oral, resp. rate 20, height 5\' 11"  (1.803 m), weight 177 lb 1.6 oz (80.332 kg), SpO2 97.00%.  General: Well developed, well nourished, male in no acute distress.  Head: Normocephalic, atraumatic, sclera non-icteric, no xanthomas, nares are without discharge. Dentition: good  Neck: no carotid bruits. JVP 5-6. No thyromegally  Lungs: clear Heart: Regular rate and rhythm with S1 S2. No S3 or S4. No murmur, no rubs, or gallops appreciated.  Abdomen: Soft, non-tender, non-distended with normoactive bowel sounds. No hepatomegaly. No rebound/guarding. No obvious abdominal masses.  Msk: Strength and tone appear normal for age. No joint deformities or effusions, no spine or costo-vertebral angle tenderness.  Extremities: No clubbing or cyanosis. No edema. Distal pedal pulses are 2+ in 4 extrem  Neuro: Alert and oriented X 3. Moves all extremities spontaneously. No focal deficits noted.  Psych: Responds to questions appropriately with a normal affect.  Skin: No rashes or lesions noted   Labs: Lab Results  Component Value Date   WBC 3.8* 01/16/2013   HGB 13.7 01/16/2013   HCT 40.8 01/16/2013   MCV 80.6 01/16/2013   PLT 158 01/16/2013  Lab 01/17/13 0200 01/16/13 1452  NA 138 --  K 3.8 --  CL 100 --  CO2 29 --  BUN 13 --  CREATININE 1.55* --  CALCIUM 9.1 --  PROT -- 7.3  BILITOT -- 0.6  ALKPHOS -- 64  ALT -- 9  AST -- 15  GLUCOSE 122* --   Lab Results  Component Value Date   CHOL 200 10/15/2012   HDL 46.60 10/15/2012   LDLCALC 121* 10/15/2012   TRIG 163.0* 10/15/2012   BNP (last 3 results)  Basename 01/16/13 1452 05/10/12 2232 02/29/12 0129  PROBNP  1010.0* 1055.0* 489.6*    Diagnostic Studies/Procedures   Dg Chest 2 View  01/16/2013  *RADIOLOGY REPORT*  Clinical Data: Chest pain and shortness of breath.  CHEST - 2 VIEW  Comparison: Chest x-ray 05/10/2012.  Findings: Lung volumes are normal.  No consolidative air space disease.  No pleural effusions.  Pulmonary vasculature is normal. Heart size is mildly enlarged (unchanged).  Aneurysmal dilatation of the thoracic aorta is grossly similar to the prior study from 05/10/2012.  Left-sided pacemaker / AICD in place with single lead terminating in the expected location of the right ventricular apex. Status post median sternotomy for CABG. Surgical clips in the lower left cervical region, likely related to prior thyroid surgery.  IMPRESSION: 1.  No radiographic evidence of acute cardiopulmonary disease. 2.  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 3.  Mild cardiomegaly is unchanged. 4.  Postoperative changes and support apparatus, as above.   Original Report Authenticated By: Trudie Reed, M.D.     Discharge Medications     Medication List     As of 01/17/2013 11:46 AM    TAKE these medications         amLODipine 5 MG tablet   Commonly known as: NORVASC   Take 1 tablet (5 mg total) by mouth 2 (two) times daily.      aspirin EC 81 MG tablet   Take 81 mg by mouth 2 (two) times daily.      atorvastatin 40 MG tablet   Commonly known as: LIPITOR   Take 10 mg by mouth every other day.      carvedilol 25 MG tablet   Commonly known as: COREG   Take 1 tablet (25 mg total) by mouth 2 (two) times daily with a meal.      clopidogrel 75 MG tablet   Commonly known as: PLAVIX   Take 1 tablet (75 mg total) by mouth daily.      digoxin 0.25 MG tablet   Commonly known as: LANOXIN   Take 1 tablet (0.25 mg total) by mouth daily.      docusate sodium 100 MG capsule   Commonly known as: COLACE   Take 100 mg by mouth daily. For constipation      furosemide 40 MG tablet    Commonly known as: LASIX   Take 1 tablet (40 mg total) by mouth daily as needed.      hydrALAZINE 25 MG tablet   Commonly known as: APRESOLINE   Take 75 mg by mouth 3 (three) times daily.      isosorbide mononitrate 30 MG 24 hr tablet   Commonly known as: IMDUR   Take 1 tablet (30 mg total) by mouth 2 (two) times daily.      lisinopril 20 MG tablet   Commonly known as: PRINIVIL,ZESTRIL   Take 1 tablet (20 mg total) by mouth daily.  loratadine 10 MG tablet   Commonly known as: CLARITIN   Take 10 mg by mouth daily as needed. allergies      nitroGLYCERIN 0.4 MG SL tablet   Commonly known as: NITROSTAT   Place 1 tablet (0.4 mg total) under the tongue every 5 (five) minutes as needed for chest pain.      oxycodone 30 MG immediate release tablet   Commonly known as: ROXICODONE   Take 30 mg by mouth 3 (three) times daily.      potassium chloride SA 20 MEQ tablet   Commonly known as: K-DUR,KLOR-CON   Take 20 mEq by mouth daily as needed. For bone pain. Sometimes takes 3 tablets at once sometimes breaks it up into multiple doses throughout the day         Disposition   The patient will be discharged in stable condition to home. Discharge Orders    Future Appointments: Provider: Department: Dept Phone: Center:   01/30/2013 10:30 AM [facility] [phone] None     Future Orders Please Complete By Expires   Diet - low sodium heart healthy      Increase activity slowly      STOP any activity that causes chest pain, shortness of breath, dizziness, sweating, or exessive weakness      ACE Inhibitor / ARB already ordered        Follow-up Information    Follow up with Arvilla Meres, MD. On 01/30/2013. (at 10:30a  [facility] Code [phone]))    Contact information:   [address] [place] [address] [phone]            Duration of Discharge Encounter: Greater than 35 minutes   Signed, Robbi Garter, [facility]  01/17/2013, 11:46 AM  Patient seen and examined with Ulyess Blossom, PA-C. Mr. Rolf is well known to me from clinic. Admitted with CP and presyncope. Had ruled out. BP stable. Ambulating without symptoms. OK for d/c on current regimen.   Nicholes Mango, MD

## 2013-01-17 NOTE — Progress Notes (Signed)
Utilization Review Completed.   Alexei Ey, RN, BSN Nurse Case Manager  [phone]  

## 2013-01-30 ENCOUNTER — Ambulatory Visit (HOSPITAL_COMMUNITY)
Admit: 2013-01-30 | Discharge: 2013-01-30 | Disposition: A | Discharge status: Home / Self Care | Payer: Medicaid Other | Attending: Internal Medicine | Admitting: Internal Medicine

## 2013-01-30 ENCOUNTER — Encounter (HOSPITAL_COMMUNITY): Payer: Self-pay

## 2013-01-30 VITALS — BP 118/86 | HR 72 | Wt 184.0 lb

## 2013-01-30 DIAGNOSIS — I10 Essential (primary) hypertension: Secondary | ICD-10-CM | POA: Insufficient documentation

## 2013-01-30 DIAGNOSIS — R0989 Other specified symptoms and signs involving the circulatory and respiratory systems: Secondary | ICD-10-CM | POA: Insufficient documentation

## 2013-01-30 DIAGNOSIS — I25810 Atherosclerosis of coronary artery bypass graft(s) without angina pectoris: Secondary | ICD-10-CM | POA: Insufficient documentation

## 2013-01-30 DIAGNOSIS — R55 Syncope and collapse: Secondary | ICD-10-CM | POA: Insufficient documentation

## 2013-01-30 DIAGNOSIS — I5022 Chronic systolic (congestive) heart failure: Secondary | ICD-10-CM | POA: Insufficient documentation

## 2013-01-30 NOTE — Assessment & Plan Note (Signed)
Stable. NYHA II. Volume status minimally elevated. Can take extra lasix if weight going up.

## 2013-01-30 NOTE — Progress Notes (Signed)
Patient ID: Chad [name], male   DOB: 1961-06-07, 52 y.o.   MRN: [id number]  History of Present Illness: Primary Cardiologist:  Dr. Arvilla Meres  Chad [name] is a 52 y.o. male with history of severe HTN, coronary artery disease status post previous myocardial infarction and bypass surgery in 2006 with DES to native PDA in 2011.  He also has a history congestive heart failure secondary to ischemic cardiomyopathy with EF in 30-35% range.  He is s/p single chamber ICD.  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 He underwent emergent repair of proximal aorta and reimplantation of his CABG grafts.  Carotids u/s in 2011 0-39%.  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 Also with significant low back pain s/p 2 surgeries  ETT/Myoview in 9/12: Walked 8:46 on [place]. Moderate-sized fixed inferior perfusion defect consistent with prior MI. EF 31% with septal hypokinesis. There was also inferior hypokinesis. Similar to prior study.   He returns for follow up. Admitted last week with presyncopal episode and atypical CP. Tele was normal. CE negative. BP well controlled on home monitor. Goes out for walks when it is not too cold. Walks an hour at a time. Weight is stable. SBP 115-125 typically. No significant CP.    Lab Results  Component Value Date   CHOL 200 10/15/2012   HDL 46.60 10/15/2012   LDLCALC 121* 10/15/2012   LDLDIRECT 120.1 01/14/2008   TRIG 163.0* 10/15/2012   CHOLHDL 4 10/15/2012     .   ROS: All pertinent positives and negatives as in HPI, otherwise negative.    Past Medical History  Diagnosis Date  . CAD (coronary artery disease)     a. s/p CABG 2006;  b. DES to PDA 2011 (cath: Dx not seen, dRCA/PDA tx with DES; S-PDA occluded (culprit), S-Dx occluded, S-RI and OM ok, L-LAD ok  . HTN (hypertension)     severe  . Chronic systolic heart failure     a. echo 8/11: EF 35%, mod LVH, Grade 1 diast dysfxn, mild AI, mild  MR, inf and post HK  . 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 CRI (chronic renal insufficiency)   . Thyroid cancer     Hertle Cell  . HLD (hyperlipidemia)   . Anemia   . Gout   . AICD (automatic cardioverter/defibrillator) present   . Carotid stenosis     dopplers 2011: 0-39% bilat.  . Chest pain syndrome     Current Outpatient Prescriptions  Medication Sig Dispense Refill  . amLODipine (NORVASC) 5 MG tablet Take 1 tablet (5 mg total) by mouth 2 (two) times daily.  60 tablet  6  . aspirin EC 81 MG tablet Take 81 mg by mouth 2 (two) times daily.       [name] atorvastatin (LIPITOR) 40 MG tablet Take 10 mg by mouth every other day.      . carvedilol (COREG) 25 MG tablet Take 1 tablet (25 mg total) by mouth 2 (two) times daily with a meal.  60 tablet  6  . clopidogrel (PLAVIX) 75 MG tablet Take 1 tablet (75 mg total) by mouth daily.  30 tablet  6  . cyclobenzaprine (FLEXERIL) 10 MG tablet Take 10 mg by mouth 3 (three) times daily as needed.      . digoxin (LANOXIN) 0.25 MG tablet Take 1 tablet (0.25 mg total) by mouth daily.  30 tablet  6  . docusate sodium (COLACE) 100 MG capsule Take 100 mg by mouth daily. For constipation      . furosemide (LASIX) 40 MG tablet Take 1 tablet (40 mg total) by mouth daily as needed.  30 tablet  6  . hydrALAZINE (APRESOLINE) 25 MG tablet Take 75 mg by mouth 3 (three) times daily.       . isosorbide mononitrate (IMDUR) 30 MG 24 hr tablet Take 1 tablet (30 mg total) by mouth 2 (two) times daily.  60 tablet  6  . lisinopril (PRINIVIL,ZESTRIL) 20 MG tablet Take 1 tablet (20 mg total) by mouth daily.  30 tablet  6  . oxycodone (ROXICODONE) 30 MG immediate release tablet Take 30 mg by mouth 3 (three) times daily.      . potassium chloride SA (K-DUR,KLOR-CON) 20 MEQ tablet Take 20 mEq by mouth daily as needed. For bone pain. Sometimes takes 3 tablets at once sometimes breaks it up into multiple doses throughout the day      . loratadine  (CLARITIN) 10 MG tablet Take 10 mg by mouth daily as needed. allergies      . nitroGLYCERIN (NITROSTAT) 0.4 MG SL tablet Place 1 tablet (0.4 mg total) under the tongue every 5 (five) minutes as needed for chest pain.  25 tablet  6    Allergies  Allergen Reactions  . Lipitor (Atorvastatin Calcium) Anaphylaxis    Large doses  . Sulfonamide Derivatives Shortness Of Breath  . Iohexol      Desc: PT HAS ANAPHYLAXIS WITH CONTRAST MEDIA!, Onset Date: 16109604   Code: SOB, Desc: ok w/ 13hr prep//a.c., Onset Date: 54098119   . Latex Other (See Comments)    With long periods of exposure REACTION: Reaction not known  . Zocor (Simvastatin) Other (See Comments)    Muscle cramps    Vital Signs: BP 156/94  Pulse 72  Wt 184 lb (83.462 kg)  SpO2 97%  PHYSICAL EXAM: No acute distress HEENT: normal Neck: JVP 8. Bilateral carotid bruits L>R Cardiac:  normal S1, S2; RRR; 2/6 SEM RSB + s4 Lungs:  clear to auscultation bilaterally, no wheezing, rhonchi or rales Abd: soft, nontender, no hepatomegaly Ext: no edema Skin: warm and dry Neuro:  CNs 2-12 intact, no focal abnormalities noted. Full strength in BLE.    ASSESSMENT AND PLAN:

## 2013-01-30 NOTE — Assessment & Plan Note (Signed)
Blood pressure well controlled. Continue current regimen.  

## 2013-01-30 NOTE — Assessment & Plan Note (Signed)
May be referred off of aortic valve but L > R. Will get carotid u/s to evaluate.

## 2013-01-30 NOTE — Addendum Note (Signed)
Encounter addended by: Theresia Bough, CMA on: 01/30/2013 10:57 AM<BR>     Documentation filed: Patient Instructions Section, Orders

## 2013-01-30 NOTE — Addendum Note (Signed)
Encounter addended by: Theresia Bough, CMA on: 01/30/2013 11:03 AM<BR>     Documentation filed: Orders

## 2013-01-30 NOTE — Assessment & Plan Note (Signed)
No evidence of ischemia. Continue current regimen.   

## 2013-01-30 NOTE — Patient Instructions (Addendum)
Your physician has requested that you have a carotid duplex. This test is an ultrasound of the carotid arteries in your neck. It looks at blood flow through these arteries that supply the brain with blood. Allow one hour for this exam. There are no restrictions or special instructions.  Your physician recommends that you schedule a follow-up appointment in: 4 months  Do the following things EVERYDAY: 1) Weigh yourself in the morning before breakfast. Write it down and keep it in a log. 2) Take your medicines as prescribed 3) Eat low salt foods-Limit salt (sodium) to 2000 mg per day.  4) Stay as active as you can everyday 5) Limit all fluids for the day to less than 2 liters 6)

## 2013-02-04 ENCOUNTER — Ambulatory Visit (HOSPITAL_COMMUNITY)
Admission: RE | Admit: 2013-02-04 | Discharge: 2013-02-04 | Disposition: A | Discharge status: Home / Self Care | Payer: Medicaid Other | Source: Ambulatory Visit | Attending: Internal Medicine | Admitting: Internal Medicine

## 2013-02-04 DIAGNOSIS — I509 Heart failure, unspecified: Secondary | ICD-10-CM | POA: Insufficient documentation

## 2013-02-04 DIAGNOSIS — I10 Essential (primary) hypertension: Secondary | ICD-10-CM

## 2013-02-04 DIAGNOSIS — I2510 Atherosclerotic heart disease of native coronary artery without angina pectoris: Secondary | ICD-10-CM | POA: Insufficient documentation

## 2013-02-04 DIAGNOSIS — R0989 Other specified symptoms and signs involving the circulatory and respiratory systems: Secondary | ICD-10-CM

## 2013-02-04 DIAGNOSIS — I6529 Occlusion and stenosis of unspecified carotid artery: Secondary | ICD-10-CM | POA: Insufficient documentation

## 2013-02-04 DIAGNOSIS — R55 Syncope and collapse: Secondary | ICD-10-CM

## 2013-02-04 NOTE — Progress Notes (Signed)
VASCULAR LAB PRELIMINARY  PRELIMINARY  PRELIMINARY  PRELIMINARY  Carotid duplex completed.    Preliminary report:  Bilateral:  No evidence of hemodynamically significant internal carotid artery stenosis.   Vertebral artery flow is antegrade.     Chad [name], RVS 02/04/2013, 2:28 PM

## 2013-02-04 NOTE — Addendum Note (Signed)
Encounter addended by: Noralee Space, RN on: 02/04/2013  8:55 AM<BR>     Documentation filed: Orders

## 2013-02-06 ENCOUNTER — Ambulatory Visit (HOSPITAL_COMMUNITY): Payer: Medicaid Other

## 2013-02-10 ENCOUNTER — Other Ambulatory Visit (HOSPITAL_COMMUNITY): Payer: Medicaid Other

## 2013-02-26 ENCOUNTER — Other Ambulatory Visit (HOSPITAL_COMMUNITY)
Admission: RE | Admit: 2013-02-26 | Discharge: 2013-02-26 | Disposition: A | Discharge status: Home / Self Care | Payer: Medicaid Other | Source: Ambulatory Visit | Attending: Family Medicine | Admitting: Family Medicine

## 2013-02-26 ENCOUNTER — Encounter: Payer: Self-pay | Admitting: Family Medicine

## 2013-02-26 ENCOUNTER — Ambulatory Visit (INDEPENDENT_AMBULATORY_CARE_PROVIDER_SITE_OTHER): Payer: Medicaid Other | Admitting: Family Medicine

## 2013-02-26 VITALS — BP 155/89 | HR 78 | Temp 98.6°F | Ht 71.0 in | Wt 183.6 lb

## 2013-02-26 DIAGNOSIS — R369 Urethral discharge, unspecified: Secondary | ICD-10-CM

## 2013-02-26 DIAGNOSIS — Z113 Encounter for screening for infections with a predominantly sexual mode of transmission: Secondary | ICD-10-CM | POA: Insufficient documentation

## 2013-02-26 LAB — POCT URINALYSIS DIPSTICK
Blood, UA: NEGATIVE
Glucose, UA: NEGATIVE
Ketones, UA: NEGATIVE
Spec Grav, UA: 1.015

## 2013-02-26 LAB — POCT UA - MICROSCOPIC ONLY

## 2013-02-26 NOTE — Assessment & Plan Note (Signed)
No evidence of infection on exam today- unclear if this was an episode of incontinence, ejaculation, or true discharge.  Obtained urethral swab for GC/Chlamydia and UA to investigate for UTI.  Discussed possibilities of cause- including not finding a cause.  Will call patient with lab results.  Reviewed signs of infection to return to care.

## 2013-02-26 NOTE — Progress Notes (Signed)
  Subjective:    Patient ID: Chad [name], male    DOB: October 03, 1961, 52 y.o.   MRN: [id number]  HPI  Chad [name] comes in with complaints of abdominal pain that started last night (was relieved by tums), and penile discharge that started today.  He denies any fever, chills, new back pain, dysuria.  He has never had a urinary tract infection.  He denies problems with urinary stream or testicular swelling/pain. He has been in the same monogamous relationship for the past 4 years, last had intercourse about 1 week ago.   Review of Systems See HPI    Objective:   Physical Exam BP 155/89  Pulse 78  Temp(Src) 98.6 F (37 C) (Oral)  Ht 5\' 11"  (1.803 m)  Wt 183 lb 9.6 oz (83.28 kg)  BMI 25.62 kg/m2 General appearance: alert, cooperative and no distress Male genitalia: Normal penis, no lesions, no discharge patient has one testicle (other non-descended) has right only, non-tender and no masses.        Assessment & Plan:

## 2013-02-26 NOTE — Patient Instructions (Signed)
It was nice to meet you.  I will call you with your lab results, in the mean time please let us know if you develop any pain with urination, fever, nausea/vomiting.

## 2013-02-26 NOTE — Addendum Note (Signed)
Addended by: [place], BONNIE on: 02/26/2013 02:56 PM   Modules accepted: Orders

## 2013-04-03 ENCOUNTER — Encounter: Payer: Self-pay | Admitting: Internal Medicine

## 2013-04-03 ENCOUNTER — Encounter (HOSPITAL_COMMUNITY): Payer: Self-pay | Admitting: Internal Medicine

## 2013-04-07 ENCOUNTER — Encounter (HOSPITAL_COMMUNITY): Payer: Self-pay | Admitting: *Deleted

## 2013-04-08 ENCOUNTER — Ambulatory Visit: Payer: Medicaid Other | Admitting: Family Medicine

## 2013-05-07 ENCOUNTER — Other Ambulatory Visit (HOSPITAL_COMMUNITY): Payer: Self-pay | Admitting: Internal Medicine

## 2013-05-24 ENCOUNTER — Other Ambulatory Visit (HOSPITAL_COMMUNITY): Payer: Self-pay | Admitting: Internal Medicine

## 2013-05-26 ENCOUNTER — Other Ambulatory Visit (HOSPITAL_COMMUNITY): Payer: Self-pay | Admitting: Internal Medicine

## 2013-06-01 ENCOUNTER — Emergency Department (HOSPITAL_COMMUNITY): Payer: Medicaid Other

## 2013-06-01 ENCOUNTER — Emergency Department (HOSPITAL_COMMUNITY)
Admission: EM | Admit: 2013-06-01 | Discharge: 2013-06-01 | Disposition: A | Discharge status: Home / Self Care | Payer: Medicaid Other | Attending: Emergency Medicine | Admitting: Emergency Medicine

## 2013-06-01 ENCOUNTER — Encounter (HOSPITAL_COMMUNITY): Payer: Self-pay

## 2013-06-01 DIAGNOSIS — Z95810 Presence of automatic (implantable) cardiac defibrillator: Secondary | ICD-10-CM | POA: Insufficient documentation

## 2013-06-01 DIAGNOSIS — M545 Low back pain, unspecified: Secondary | ICD-10-CM | POA: Insufficient documentation

## 2013-06-01 DIAGNOSIS — M546 Pain in thoracic spine: Secondary | ICD-10-CM

## 2013-06-01 DIAGNOSIS — I5022 Chronic systolic (congestive) heart failure: Secondary | ICD-10-CM | POA: Insufficient documentation

## 2013-06-01 DIAGNOSIS — Z8739 Personal history of other diseases of the musculoskeletal system and connective tissue: Secondary | ICD-10-CM | POA: Insufficient documentation

## 2013-06-01 DIAGNOSIS — Z85850 Personal history of malignant neoplasm of thyroid: Secondary | ICD-10-CM | POA: Insufficient documentation

## 2013-06-01 DIAGNOSIS — N189 Chronic kidney disease, unspecified: Secondary | ICD-10-CM | POA: Insufficient documentation

## 2013-06-01 DIAGNOSIS — Z951 Presence of aortocoronary bypass graft: Secondary | ICD-10-CM | POA: Insufficient documentation

## 2013-06-01 DIAGNOSIS — Z79899 Other long term (current) drug therapy: Secondary | ICD-10-CM | POA: Insufficient documentation

## 2013-06-01 DIAGNOSIS — Z8639 Personal history of other endocrine, nutritional and metabolic disease: Secondary | ICD-10-CM | POA: Insufficient documentation

## 2013-06-01 DIAGNOSIS — Z7902 Long term (current) use of antithrombotics/antiplatelets: Secondary | ICD-10-CM | POA: Insufficient documentation

## 2013-06-01 DIAGNOSIS — G8929 Other chronic pain: Secondary | ICD-10-CM | POA: Insufficient documentation

## 2013-06-01 DIAGNOSIS — Z8679 Personal history of other diseases of the circulatory system: Secondary | ICD-10-CM | POA: Insufficient documentation

## 2013-06-01 DIAGNOSIS — M79609 Pain in unspecified limb: Secondary | ICD-10-CM | POA: Insufficient documentation

## 2013-06-01 DIAGNOSIS — R079 Chest pain, unspecified: Secondary | ICD-10-CM | POA: Insufficient documentation

## 2013-06-01 DIAGNOSIS — E785 Hyperlipidemia, unspecified: Secondary | ICD-10-CM | POA: Insufficient documentation

## 2013-06-01 DIAGNOSIS — Z862 Personal history of diseases of the blood and blood-forming organs and certain disorders involving the immune mechanism: Secondary | ICD-10-CM | POA: Insufficient documentation

## 2013-06-01 DIAGNOSIS — Z91040 Latex allergy status: Secondary | ICD-10-CM | POA: Insufficient documentation

## 2013-06-01 DIAGNOSIS — I129 Hypertensive chronic kidney disease with stage 1 through stage 4 chronic kidney disease, or unspecified chronic kidney disease: Secondary | ICD-10-CM | POA: Insufficient documentation

## 2013-06-01 DIAGNOSIS — Z9889 Other specified postprocedural states: Secondary | ICD-10-CM | POA: Insufficient documentation

## 2013-06-01 DIAGNOSIS — I2510 Atherosclerotic heart disease of native coronary artery without angina pectoris: Secondary | ICD-10-CM | POA: Insufficient documentation

## 2013-06-01 DIAGNOSIS — Z7982 Long term (current) use of aspirin: Secondary | ICD-10-CM | POA: Insufficient documentation

## 2013-06-01 LAB — POCT I-STAT, CHEM 8
Creatinine, Ser: 1.1 mg/dL (ref 0.50–1.35)
HCT: 41 % (ref 39.0–52.0)
Hemoglobin: 13.9 g/dL (ref 13.0–17.0)
Potassium: 3.6 mEq/L (ref 3.5–5.1)
Sodium: 144 mEq/L (ref 135–145)
TCO2: 29 mmol/L (ref 0–100)

## 2013-06-01 LAB — POCT I-STAT TROPONIN I: Troponin i, poc: 0.01 ng/mL (ref 0.00–0.08)

## 2013-06-01 MED ORDER — HYDROMORPHONE HCL PF 2 MG/ML IJ SOLN
2.0000 mg | Freq: Once | INTRAMUSCULAR | Status: AC
  Administered 2013-06-01: 2 mg via INTRAVENOUS
  Filled 2013-06-01: qty 1

## 2013-06-01 NOTE — ED Provider Notes (Signed)
History     CSN: [id number] Arrival date & time 06/01/13  1539 First MD Initiated Contact with Patient 06/01/13 1614     Chief Complaint  Patient presents with  . Back Pain   HPI  52 year old male with history of chronic low back pain managed by [facility] pain clinic, thoracic back pain and left arm pain thought related cervical spine issues including stenosis, spondylosis, and spurring at C4-C7,  CAD s/p CABG and subsequent stents, CHF EF 35%, history of type a aortic dissection s/p repair presenting with longer episode of his typical thoracic, cervical back pain and left arm pain  He hasfelt this pain for 10 years before but usually goes away after 1-2 days. Often gets brother to "adjust him" which typically causes pain to improve. Tried that this time without relief. Current pain started 5-6 days ago in between shoulder blades up to middle of neck. States pain has been gradually progressive over these 5-6 days and unable to sleep last night so that's why he came to ED. Most concentrated area feels like ball of fire between shoulder blades and has sensitive spot on skin just to left of thoracic spine midway down the shoulder blades. 10/10 pain in this area which comes and goes in intensity but never fully resolves. Peak periods last for minutes.  Pain worsens with deep breaths at times. Pain down left arm into triceps most intensely but hurts throughout the arm.  Denies numbness/tingling down into hands. No focal weakness. Previous treatment-Has tried 30mg  of oxycodone q 8 hours without any relief (which is still helpful for low back pain). Took 325 aspirin instead of 81mg  without relief. Worse with-standing causes "ball of fire". Pain not worse with exertion. Pain sometimes better with certain movements-holding arm close to body or above head.   At times, feels chest pain when pain flares and is currently feeling pain in chest underneath pacemaker/ICD described as an ache. Not exertional and not relieved  by rest. He experiences this pain frequently when he is having the mid upper back pain described above. Does not feel typical of pain he has had in the past with MI and states he has had this pain with previous episodes of the pain as noted above.   Other information:  Neurosurgeon-Dr. Lelon Perla.  Last cervical films in 07/20/2009 by CT with contrast showed:" Normal cervical alignment. There is moderate disc  degeneration and spurring at C3-4 and C4-5. There is moderate to  extensive spondylosis at C5-6 and C6-7 with spinal stenosis at C5-6  and C6-7 due to diffuse uncovertebral spurring. There is mass  effect on the nerve root sleeves bilaterally at C4-5 and C5-6, and  C6-7. There is diffuse facet degeneration."  ROS-denies fever. Did vomit x 1 today due to pain. Occasional chills.  No worsening SOB from baseline. No edema noted in legs recently.  Past Medical History  Diagnosis Date  . CAD (coronary artery disease)     a. s/p CABG 2006;  b. DES to PDA 2011 (cath: Dx not seen, dRCA/PDA tx with DES; S-PDA occluded (culprit), S-Dx occluded, S-RI and OM ok, L-LAD ok  . HTN (hypertension)     severe  . Chronic systolic heart failure     a. echo 8/11: EF 35%, mod LVH, Grade 1 diast dysfxn, mild AI, mild MR, inf and post HK  . 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 CRI (chronic renal insufficiency)   .  Thyroid cancer     Hertle Cell  . HLD (hyperlipidemia)   . Anemia   . Gout   . AICD (automatic cardioverter/defibrillator) present   . Carotid stenosis     dopplers 2011: 0-39% bilat.  . Chest pain syndrome   Cared for by [facility] clinic.   Past Surgical History  Procedure Laterality Date  . Coronary artery bypass graft  06/21/2009    x2  . Cardiac defibrillator placement      [employer]  . Thyroidectomy, partial  06/20/11  . Lumbar disc surgery      x 2, most recent within 5-10 years  . Status post emergency repair of a type a ascending aortic  dissection with a hemiarch reconstruction of the ascending aorta  using a 28-mm hemashield graft with redo sternotomy and revision of previous bypass grafts in june 2010.    . Median sternotomy,cabg x 7  06/29/2005    Family History  Problem Relation Age of Onset  . Coronary artery disease    . Hypertension Father   . Heart disease Father   . Early death Father   . COPD Father   . Hypertension Mother     History  Substance Use Topics  . Smoking status: Never Smoker   . Smokeless tobacco: Never Used  . Alcohol Use: No     Review of Systems A full 10 point review of symptoms was performed and was negative except as noted in HPI.   Allergies  Lipitor; Sulfonamide derivatives; Iohexol; Latex; and Zocor  Home Medications   Current Outpatient Rx  Name  Route  Sig  Dispense  Refill  . amLODipine (NORVASC) 5 MG tablet   Oral   Take 1 tablet (5 mg total) by mouth 2 (two) times daily.   60 tablet   6   . aspirin EC 81 MG tablet   Oral   Take 162 mg by mouth 2 (two) times daily.          [name] atorvastatin (LIPITOR) 40 MG tablet   Oral   Take 10 mg by mouth 2 (two) times a week.         . carvedilol (COREG) 25 MG tablet   Oral   Take 1 tablet (25 mg total) by mouth 2 (two) times daily with a meal.   60 tablet   6   . clopidogrel (PLAVIX) 75 MG tablet      TAKE ONE TABLET BY MOUTH EVERY DAY   30 tablet   0   . cyclobenzaprine (FLEXERIL) 10 MG tablet   Oral   Take 10 mg by mouth 2 (two) times daily as needed for muscle spasms.          . digoxin (LANOXIN) 0.25 MG tablet      TAKE ONE TABLET BY MOUTH EVERY DAY   30 tablet   6   . docusate sodium (COLACE) 100 MG capsule   Oral   Take 100 mg by mouth daily.          . furosemide (LASIX) 40 MG tablet   Oral   Take 1 tablet (40 mg total) by mouth daily as needed.   30 tablet   6   . hydrALAZINE (APRESOLINE) 25 MG tablet   Oral   Take 75 mg by mouth 2 (two) times daily.         . isosorbide  mononitrate (IMDUR) 30 MG 24 hr tablet      TAKE ONE  TABLET BY MOUTH TWICE DAILY   60 tablet   6   . lisinopril (PRINIVIL,ZESTRIL) 20 MG tablet   Oral   Take 1 tablet (20 mg total) by mouth daily.   30 tablet   6   . loratadine (CLARITIN) 10 MG tablet   Oral   Take 10 mg by mouth daily as needed for allergies.          . nitroGLYCERIN (NITROSTAT) 0.4 MG SL tablet   Sublingual   Place 1 tablet (0.4 mg total) under the tongue every 5 (five) minutes as needed for chest pain.   25 tablet   6   . oxycodone (ROXICODONE) 30 MG immediate release tablet   Oral   Take 30 mg by mouth every 8 (eight) hours as needed for pain.          . potassium chloride SA (K-DUR,KLOR-CON) 20 MEQ tablet   Oral   Take 20 mEq by mouth daily as needed (for muscle pain).            BP 125/80  Pulse 72  Temp(Src) 98 F (36.7 C) (Oral)  SpO2 97%  Physical Exam  Constitutional: He is oriented to person, place, and time. He appears well-developed and well-nourished. He appears distressed (appears uncomfortable due to pain).  HENT:  Head: Normocephalic and atraumatic.  Mouth/Throat: No oropharyngeal exudate.  Eyes: EOM are normal. Pupils are equal, round, and reactive to light.  Neck: Normal range of motion. Neck supple.  Cardiovascular: Normal rate and regular rhythm.  Exam reveals no gallop and no friction rub.   No murmur heard. Pulmonary/Chest: Effort normal and breath sounds normal. He has no rales. He exhibits tenderness (around AICD).  Abdominal: Soft. Bowel sounds are normal. He exhibits no mass. There is no tenderness. There is no rebound and no guarding.  Musculoskeletal: Normal range of motion.  Patient without tenderness over spinal column. Slight spasm throughout cervical and thoracic spine in paraspinous muscles. Patient does have an area to left of his thoracic spine around t4-t5 that is particularly sensitive but no mass or abnormality felt and still only mild spasm.    Neurological: He is alert and oriented to person, place, and time. He displays normal reflexes. No cranial nerve deficit. He exhibits normal muscle tone. Coordination normal.  5/5 muscle strength throughout upper and lower extremity with exception of arm abduction which is 4/5 (patient states has chronically been like this for at least 5 years)  Skin: Skin is warm and dry.   EKG-HR 60, sinus rhythm (does not currently appear to be paced), q waves in III and avf as well as v1 and v2, inverted t waves v4-v6 as well as ii, iii, avf. Changes compared to EKG on 01/18/13 and appear stable.   ED Course  Procedures (including critical care time)  Labs Reviewed  POCT I-STAT, CHEM 8  POCT I-STAT TROPONIN I   Dg Chest 2 View  06/01/2013   *RADIOLOGY REPORT*  Clinical Data: Back and chest pain.  CHEST - 2 VIEW  Comparison: 01/16/2013  Findings: Stable appearance of the cardiac pacing / defibrillating device.  The heart is mildly enlarged and stable in size.  No edema, focal consolidation or pleural fluid is identified.  There is mild atelectasis at both lung bases.  Stable prominence and tortuosity of the thoracic aorta.  The bony thorax is unremarkable.  IMPRESSION: Stable cardiomegaly and tortuosity of the thoracic aorta.  No acute findings.   Original Report Authenticated By:  Irish Lack, M.D.     1. Back pain, thoracic   likely secondary to cervical spine issues (spondylosis, spurs, stenosis)   MDM  52 year old male with history of chronic low back pain managed by [facility] pain clinic, thoracic back pain and left arm pain thought related cervical spine issues including stenosis, spondylosis, and spurring at C4-C7,  CAD s/p CABG and subsequent stents, CHF EF 35%, history of type a aortic dissection s/p repair presenting with cervical and thoracic back pain.  Pain is typical of flares he has had throughout the last 10 years. He has been told by his neurologist that he likely will need cervical surgery.  Likely, the thoracic pain is referred pain from cervical spine. Patient without neurological deficits on exam today (with exceptions of chronic abduction weakness). Obtained an i-stat troponin  And EKG (checked due to likely MSK chest pain) and chem 8 which were unremarkable. Also obtained CXR and aorta appeared stable. Pain down to 4/10 after 4mg  of dilaudid. Patient states this level is tolerable and he believes he can get some rest at home. Discussed case with Dr. Radford Pax and we both agree patient is stable for discharge. He is advised to schedule an appointment with his neurologist, pain clinic, and [facility] within 1-2 weeks for each. Also told patient could consider steroids for anti-inflammatory properties but to avoid NSAIDs due to Cr baseline around 1.5. Could also consider neuropathic agents such as gabapentin or lyrica for chronic pain.   Shelva Majestic, MD 06/01/13 2237

## 2013-06-01 NOTE — ED Notes (Signed)
He c/o non-traumatic pain at upper right spine/lower neck area radiating into his right arm x 5 days.  He tells me he has hx of "lower spine surgery--and they told me I'd probably need additional spine surgeries".  He states his usual pain meds which he takes for sciatica are quite insufficient for this pain.  He also states that today "the pain got so bad I threw up".

## 2013-06-02 ENCOUNTER — Telehealth: Payer: Self-pay | Admitting: *Deleted

## 2013-06-02 NOTE — Telephone Encounter (Signed)
Pt reports visiting ED at [facility] last night and getting minium relief of back pain - states that he is unable to control pain - states that he has called pain clinic for appointment and surgeon. Recommended moist heat and muscle rub for temporary relief. Appointment for 830 in cross cover for tomorrow.  Wyatt Haste, RN-BSN

## 2013-06-02 NOTE — ED Provider Notes (Signed)
I saw and evaluated the patient, reviewed the resident's note and I agree with the findings and plan.   .Face to face Exam:  General:  Awake HEENT:  Atraumatic Resp:  Normal effort Abd:  Nondistended Neuro:No focal weakness   Nelia Shi, MD 06/02/13 2225

## 2013-06-03 ENCOUNTER — Ambulatory Visit (INDEPENDENT_AMBULATORY_CARE_PROVIDER_SITE_OTHER): Payer: Medicaid Other | Admitting: Family Medicine

## 2013-06-03 VITALS — BP 102/67 | HR 79 | Temp 98.0°F | Ht 71.75 in | Wt 180.0 lb

## 2013-06-03 DIAGNOSIS — M542 Cervicalgia: Secondary | ICD-10-CM | POA: Insufficient documentation

## 2013-06-03 MED ORDER — GABAPENTIN 300 MG PO CAPS: 300.0000 mg | ORAL_CAPSULE | Freq: Every day | ORAL | Status: DC

## 2013-06-03 MED ORDER — PREDNISONE 20 MG PO TABS: 40.0000 mg | ORAL_TABLET | Freq: Every day | ORAL | Status: DC

## 2013-06-03 NOTE — Progress Notes (Signed)
  Subjective:    Patient ID: Chad [name], male    DOB: 18-Dec-1961, 52 y.o.   MRN: [id number]  HPI  Chad [name] comes in to clinic for follow up of upper back/neck pain.  He was in the ER 2 days ago for the pain, and he says they gave him a bunch of dilaudid that did not help.  He says that he has had pain on and off for years.  He has had two surgeries on his back in the past.  He says the pharmacist told him to by OTC NSAIDS, he has been taking 600 mg of ibuprofen several times a day and using Aspercreme and heat to the area that hurts.  This has helped some.  He does know he has kidney issues though and that NSAIDS are not the best medication for him.  He denies any falls or injuries.  He does have pain in his left arm, but it is not a shooting pain, and he has not had numbness, weakness in the left arm.   Review of Systems See HPI    Objective:   Physical Exam BP 102/67  Pulse 79  Temp(Src) 98 F (36.7 C) (Oral)  Ht 5' 11.75" (1.822 m)  Wt 180 lb (81.647 kg)  BMI 24.59 kg/m2 General appearance: alert, cooperative and no distress Neck: Negative spurling's Full neck range of motion Grip strength and sensation normal in bilateral hands Strength good C4 to T1 distribution No sensory change to C4 to T1 Reflexes normal At T-1 level patient has muscle pain a few inches to the left with muscle spasm.  Mild tenderness over cervical and upper thoracic spine.       Assessment & Plan:

## 2013-06-03 NOTE — Assessment & Plan Note (Signed)
Unclear if this is strictly muscular or if there is disc involvement.  Will Rx prednisone x 7 days since anti-inflammatory seems to help him, but advised no NSAIDS.  Also Rx gabapentin for nerve irritation.  Advised heat or ice, as well as regular neck stretching.  Advised to follow up in 2 weeks.  If no improvement could consider re-referral to neurosurgery or imaging.

## 2013-06-03 NOTE — Patient Instructions (Signed)
I am sorry you are in so much pain.  I want you to start taking gabapentin at bedtime.  This is a medication for nerve irritation.    Also, please take prednisone 40 mg (two tablets) once a day for 7 days.  This is a strong anti-inflammatory medication that does not affect your kidneys.  Stop taking the over the counter anti-inflammatory to protect your kidneys.   Continue heat or ice packs to your back/neck.  Also, please stretch your neck three times a day to help your muscles loosen.

## 2013-06-05 IMAGING — CR DG CHEST 2V
2 series · 2 of 2 positions shown · non-contrast
Comparison: Chest x-ray 05/10/2012.

CLINICAL DATA: Chest pain and shortness of breath.

CHEST - 2 VIEW

[w chest pa]
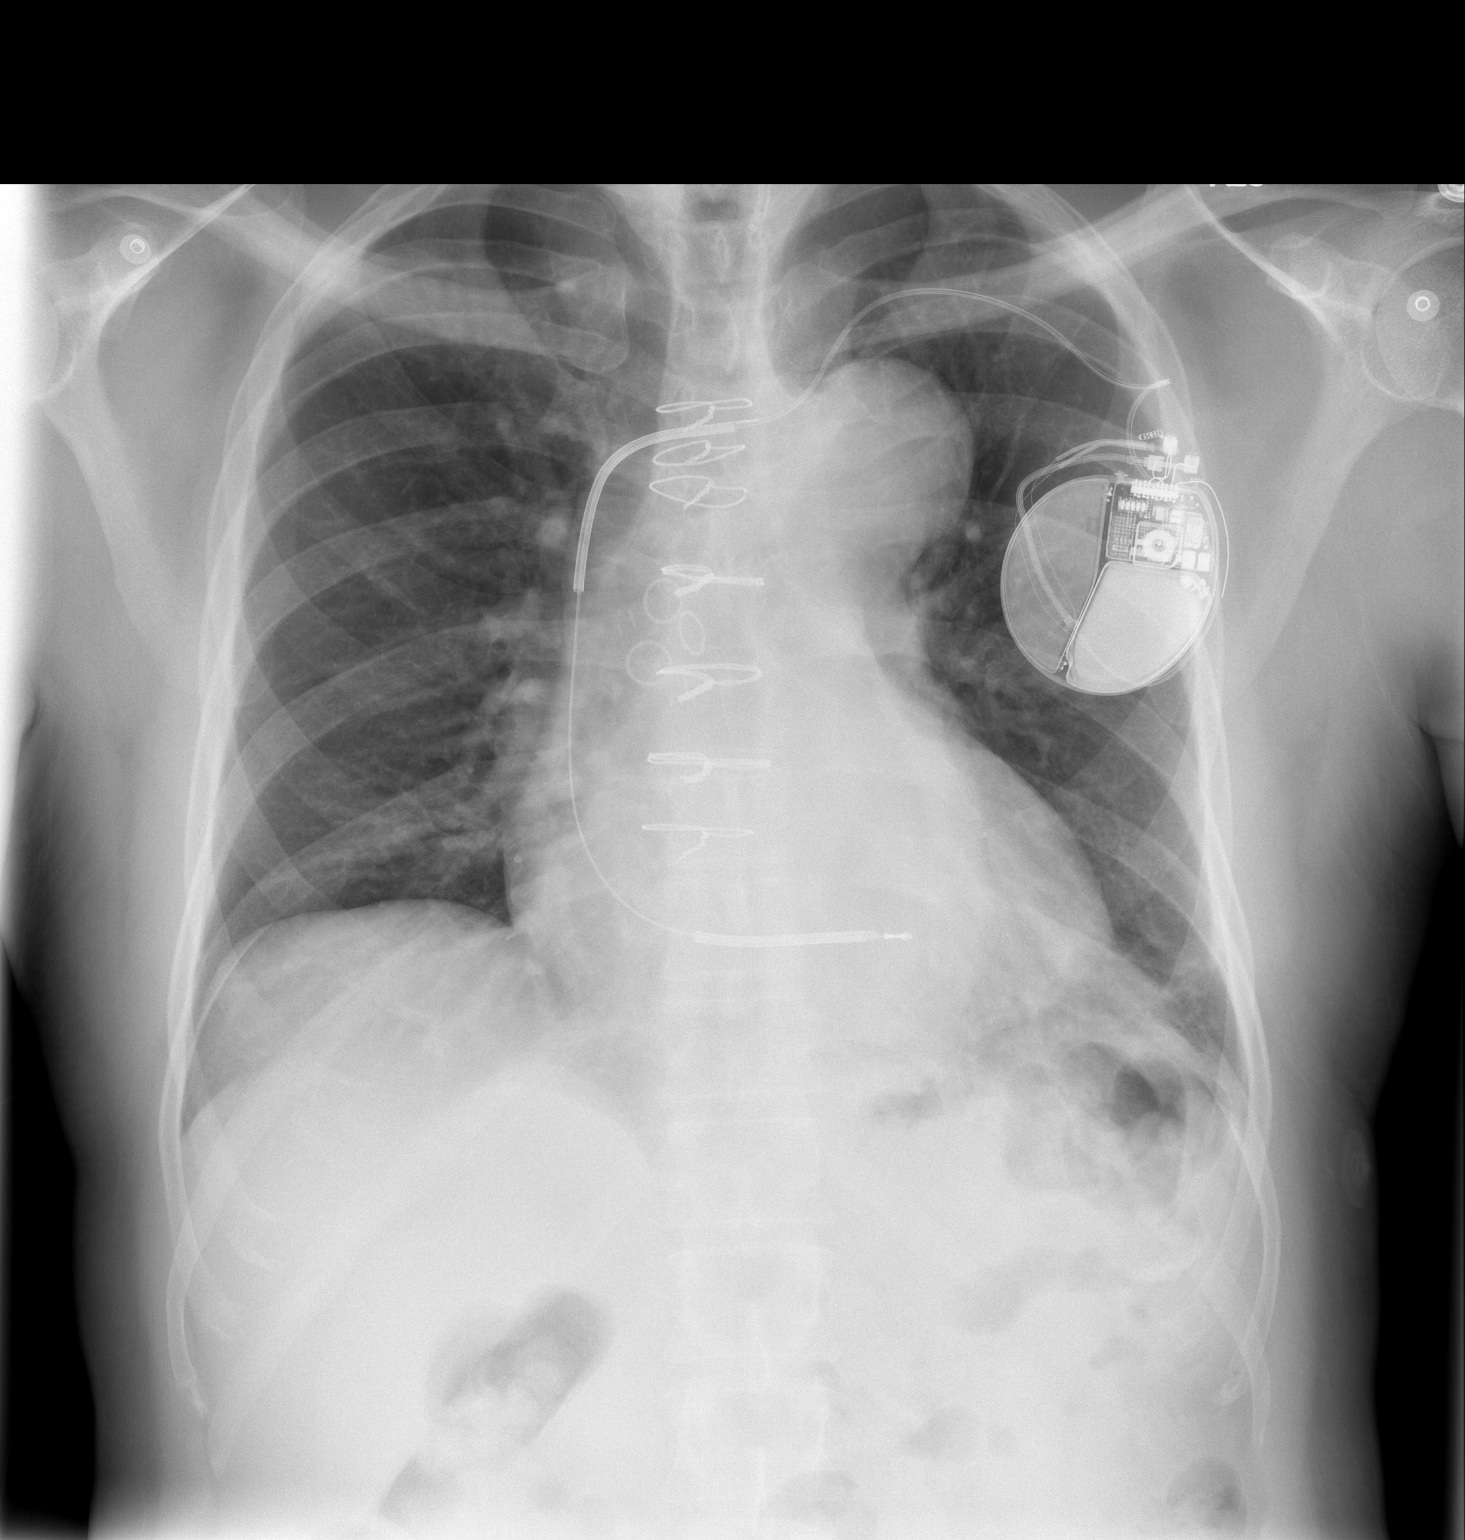

[w chest lat]
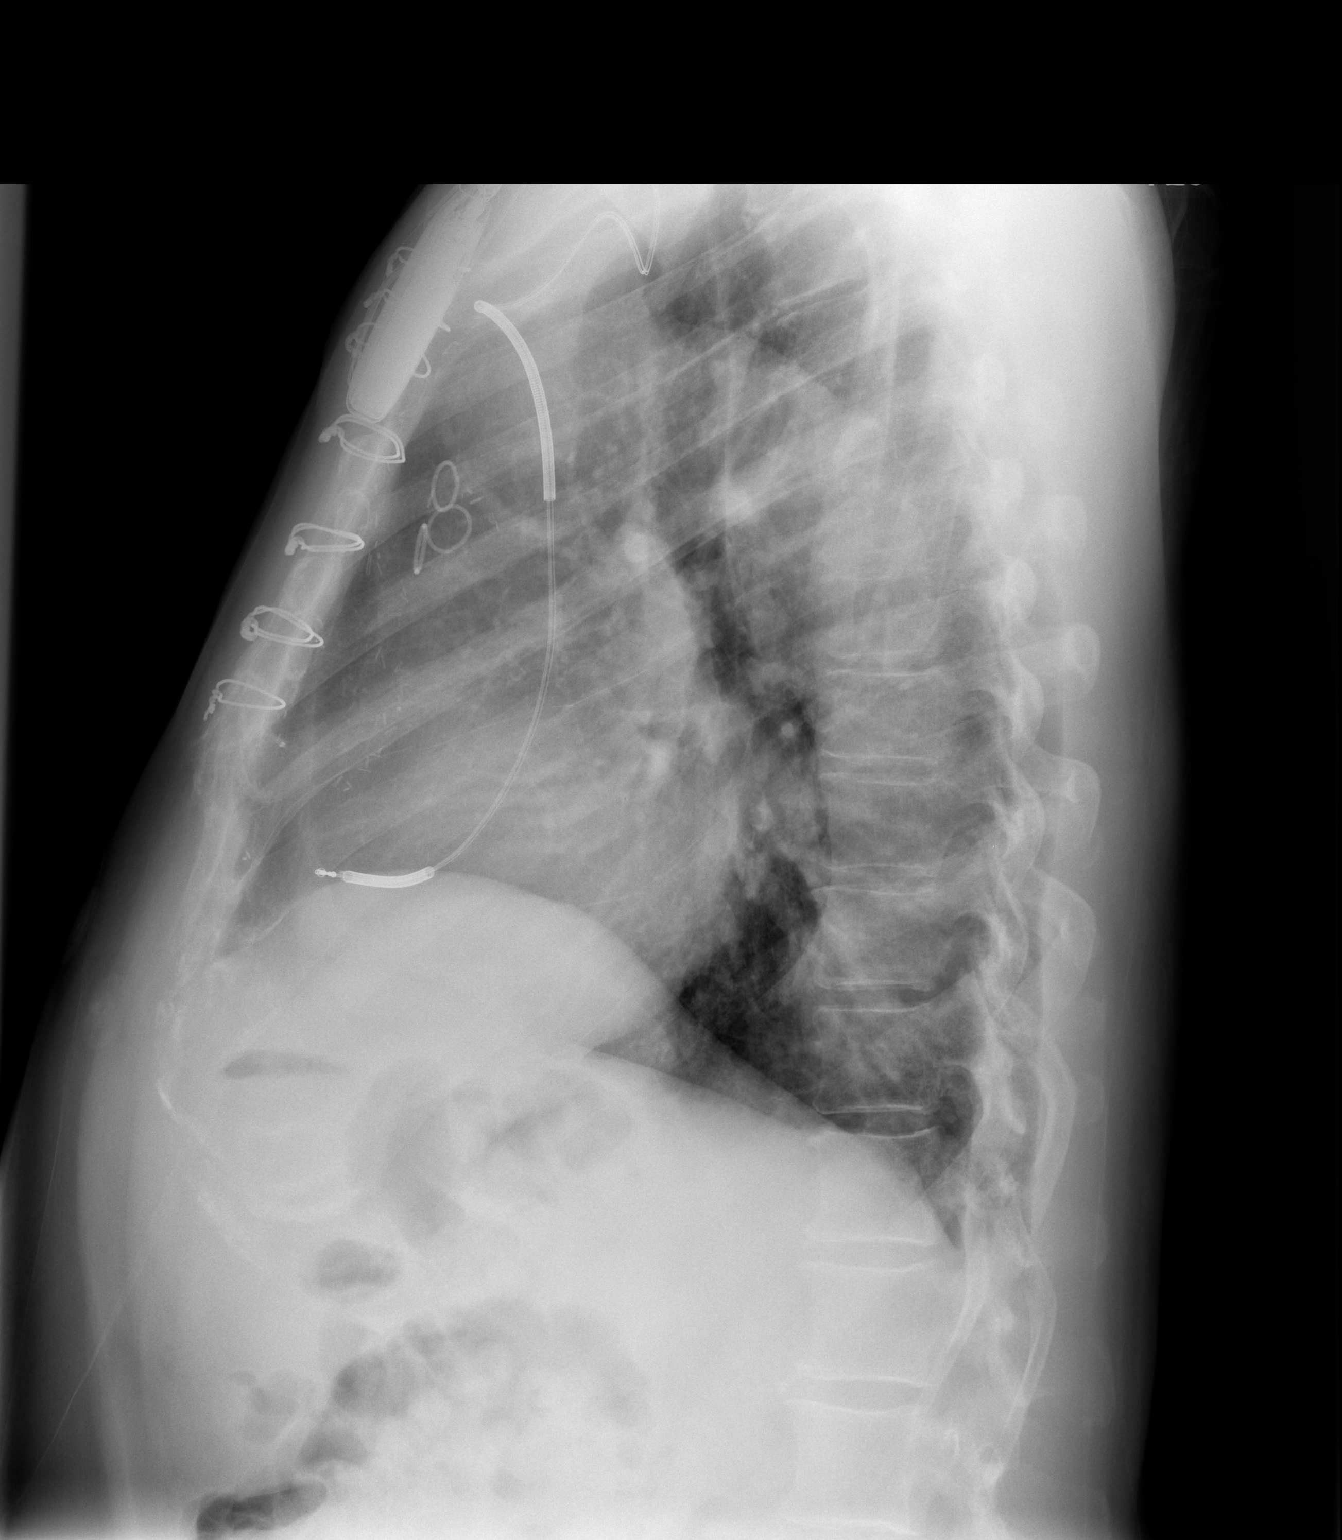

[2 of 2 positions shown; findings below may reference images not displayed]

FINDINGS: Lung volumes are normal.  No consolidative air space
disease.  No pleural effusions.  Pulmonary vasculature is normal.
Heart size is mildly enlarged (unchanged).  Aneurysmal dilatation
of the thoracic aorta is grossly similar to the prior study from
05/10/2012.  Left-sided pacemaker / AICD in place with single lead
terminating in the expected location of the right ventricular apex.
Status post median sternotomy for CABG. Surgical clips in the lower
left cervical region, likely related to prior thyroid surgery.
IMPRESSION: 1.  No radiographic evidence of acute cardiopulmonary disease.
2.  Aneurysmal dilatation of the thoracic aorta redemonstrated.
3.  Mild cardiomegaly is unchanged.
4.  Postoperative changes and support apparatus, as above.

## 2013-06-07 ENCOUNTER — Other Ambulatory Visit (HOSPITAL_COMMUNITY): Payer: Self-pay | Admitting: Internal Medicine

## 2013-06-16 ENCOUNTER — Ambulatory Visit (HOSPITAL_COMMUNITY): Payer: Medicaid Other | Attending: Internal Medicine

## 2013-06-17 ENCOUNTER — Encounter (HOSPITAL_COMMUNITY): Payer: Self-pay | Admitting: *Deleted

## 2013-06-28 ENCOUNTER — Other Ambulatory Visit (HOSPITAL_COMMUNITY): Payer: Self-pay | Admitting: Internal Medicine

## 2013-07-17 ENCOUNTER — Other Ambulatory Visit: Payer: Self-pay | Admitting: Family Medicine

## 2013-07-17 ENCOUNTER — Other Ambulatory Visit (HOSPITAL_COMMUNITY): Payer: Self-pay | Admitting: Internal Medicine

## 2013-07-18 ENCOUNTER — Other Ambulatory Visit: Payer: Self-pay | Admitting: *Deleted

## 2013-07-18 MED ORDER — AMLODIPINE BESYLATE 5 MG PO TABS: 5.0000 mg | ORAL_TABLET | Freq: Two times a day (BID) | ORAL | Status: DC

## 2013-07-18 MED ORDER — LORATADINE 10 MG PO TABS: 10.0000 mg | ORAL_TABLET | Freq: Every day | ORAL | Status: DC | PRN

## 2013-07-22 ENCOUNTER — Other Ambulatory Visit (HOSPITAL_COMMUNITY): Payer: Self-pay | Admitting: *Deleted

## 2013-07-22 MED ORDER — POTASSIUM CHLORIDE CRYS ER 20 MEQ PO TBCR: 20.0000 meq | EXTENDED_RELEASE_TABLET | Freq: Every day | ORAL | Status: DC | PRN

## 2013-07-22 MED ORDER — HYDRALAZINE HCL 25 MG PO TABS: 75.0000 mg | ORAL_TABLET | Freq: Two times a day (BID) | ORAL | Status: DC

## 2013-07-22 MED ORDER — LISINOPRIL 20 MG PO TABS: 20.0000 mg | ORAL_TABLET | Freq: Every day | ORAL | Status: DC

## 2013-07-22 MED ORDER — DIGOXIN 250 MCG PO TABS: ORAL_TABLET | ORAL | Status: DC

## 2013-07-22 MED ORDER — AMLODIPINE BESYLATE 5 MG PO TABS: 5.0000 mg | ORAL_TABLET | Freq: Two times a day (BID) | ORAL | Status: DC

## 2013-07-22 MED ORDER — ATORVASTATIN CALCIUM 40 MG PO TABS: 10.0000 mg | ORAL_TABLET | ORAL | Status: DC

## 2013-07-22 MED ORDER — FUROSEMIDE 40 MG PO TABS: 40.0000 mg | ORAL_TABLET | Freq: Every day | ORAL | Status: DC | PRN

## 2013-07-22 MED ORDER — CLOPIDOGREL BISULFATE 75 MG PO TABS: ORAL_TABLET | ORAL | Status: DC

## 2013-07-22 MED ORDER — ISOSORBIDE MONONITRATE ER 30 MG PO TB24: ORAL_TABLET | ORAL | Status: DC

## 2013-07-22 MED ORDER — CARVEDILOL 25 MG PO TABS: ORAL_TABLET | ORAL | Status: DC

## 2013-07-22 MED ORDER — NITROGLYCERIN 0.4 MG SL SUBL: 0.4000 mg | SUBLINGUAL_TABLET | SUBLINGUAL | Status: DC | PRN

## 2013-07-29 ENCOUNTER — Ambulatory Visit (HOSPITAL_COMMUNITY)
Admission: RE | Admit: 2013-07-29 | Discharge: 2013-07-29 | Disposition: A | Discharge status: Home / Self Care | Payer: Medicaid Other | Source: Ambulatory Visit | Attending: Internal Medicine | Admitting: Internal Medicine

## 2013-07-29 ENCOUNTER — Other Ambulatory Visit: Payer: Self-pay

## 2013-07-29 ENCOUNTER — Encounter (HOSPITAL_COMMUNITY): Payer: Self-pay

## 2013-07-29 VITALS — BP 120/68 | HR 71 | Wt 182.8 lb

## 2013-07-29 DIAGNOSIS — I25810 Atherosclerosis of coronary artery bypass graft(s) without angina pectoris: Secondary | ICD-10-CM

## 2013-07-29 DIAGNOSIS — I5022 Chronic systolic (congestive) heart failure: Secondary | ICD-10-CM

## 2013-07-29 DIAGNOSIS — N182 Chronic kidney disease, stage 2 (mild): Secondary | ICD-10-CM

## 2013-07-29 DIAGNOSIS — I7103 Dissection of thoracoabdominal aorta: Secondary | ICD-10-CM

## 2013-07-29 DIAGNOSIS — I2510 Atherosclerotic heart disease of native coronary artery without angina pectoris: Secondary | ICD-10-CM

## 2013-07-29 DIAGNOSIS — I129 Hypertensive chronic kidney disease with stage 1 through stage 4 chronic kidney disease, or unspecified chronic kidney disease: Secondary | ICD-10-CM | POA: Insufficient documentation

## 2013-07-29 DIAGNOSIS — I10 Essential (primary) hypertension: Secondary | ICD-10-CM

## 2013-07-29 NOTE — Patient Instructions (Addendum)
Stop digoxin.  Stop amlodipine.  Go to [place] next week to get cholesterol checked, you need to not eat or drink anything for breakfast.  Follow up 6 months.

## 2013-07-29 NOTE — Progress Notes (Signed)
Patient ID: Chad [name], male   DOB: 12/28/1960, 53 y.o.   MRN: [id number]  History of Present Illness: Primary Cardiologist:  Dr. Arvilla Meres  Chad [name] is a 52 y.o. male with history of severe HTN, coronary artery disease status post previous myocardial infarction and bypass surgery in 2006 with DES to native PDA in 2011.  He also has a history congestive heart failure secondary to ischemic cardiomyopathy with EF in 30-35% range.  He is s/p single chamber ICD.  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 He underwent emergent repair of proximal aorta and reimplantation of his CABG grafts.  Carotids u/s in 2011 0-39%.  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 Also with significant low back pain s/p 2 surgeries  ETT/Myoview in 9/12: Walked 8:46 on [place]. Moderate-sized fixed inferior perfusion defect consistent with prior MI. EF 31% with septal hypokinesis. There was also inferior hypokinesis. Similar to prior study.   Follow up: Feeling great and able to get around and do all that he needs to do. Currently working nights on a road crew. Denies SOB/orthopnea or CP. Walking about an hour everyday. Following a low salt diet. Weight at home 177 lbs. Taking medications as prescribed. BP much lower now that he is working on his new job.   ROS: All pertinent positives and negatives as in HPI, otherwise negative.    Past Medical History  Diagnosis Date  . CAD (coronary artery disease)     a. s/p CABG 2006;  b. DES to PDA 2011 (cath: Dx not seen, dRCA/PDA tx with DES; S-PDA occluded (culprit), S-Dx occluded, S-RI and OM ok, L-LAD ok  . HTN (hypertension)     severe  . Chronic systolic heart failure     a. echo 8/11: EF 35%, mod LVH, Grade 1 diast dysfxn, mild AI, mild MR, inf and post HK  . 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 CRI (chronic renal insufficiency)   . Thyroid cancer     Hertle Cell   . HLD (hyperlipidemia)   . Anemia   . Gout   . AICD (automatic cardioverter/defibrillator) present   . Carotid stenosis     dopplers 2011: 0-39% bilat.  . Chest pain syndrome     Current Outpatient Prescriptions  Medication Sig Dispense Refill  . amLODipine (NORVASC) 5 MG tablet Take 1 tablet (5 mg total) by mouth 2 (two) times daily.  60 tablet  6  . aspirin EC 81 MG tablet Take 162 mg by mouth 2 (two) times daily.       [name] atorvastatin (LIPITOR) 40 MG tablet Take 0.5 tablets (20 mg total) by mouth 2 (two) times a week.  10 tablet  3  . carvedilol (COREG) 25 MG tablet TAKE ONE TABLET BY MOUTH TWICE DAILY WITH MEALS  60 tablet  2  . clopidogrel (PLAVIX) 75 MG tablet TAKE ONE TABLET BY MOUTH ONCE DAILY  30 tablet  3  . cyclobenzaprine (FLEXERIL) 10 MG tablet Take 10 mg by mouth 2 (two) times daily as needed for muscle spasms.       . digoxin (LANOXIN) 0.25 MG tablet TAKE ONE TABLET BY MOUTH EVERY DAY  30 tablet  3  . docusate sodium (COLACE) 100 MG capsule Take 100 mg by mouth daily.       . furosemide (LASIX) 40 MG tablet Take 1 tablet (40 mg total) by  mouth daily as needed.  30 tablet  3  . gabapentin (NEURONTIN) 300 MG capsule TAKE ONE CAPSULE BY MOUTH AT BEDTIME  30 capsule  2  . hydrALAZINE (APRESOLINE) 25 MG tablet Take 3 tablets (75 mg total) by mouth 2 (two) times daily.  180 tablet  3  . isosorbide mononitrate (IMDUR) 30 MG 24 hr tablet TAKE ONE TABLET BY MOUTH TWICE DAILY  60 tablet  3  . lisinopril (PRINIVIL,ZESTRIL) 20 MG tablet Take 1 tablet (20 mg total) by mouth daily.  30 tablet  3  . loratadine (CLARITIN) 10 MG tablet Take 1 tablet (10 mg total) by mouth daily as needed for allergies.  30 tablet  3  . nitroGLYCERIN (NITROSTAT) 0.4 MG SL tablet Place 1 tablet (0.4 mg total) under the tongue every 5 (five) minutes as needed for chest pain.  25 tablet  3  . oxycodone (ROXICODONE) 30 MG immediate release tablet Take 30 mg by mouth every 8 (eight) hours as needed for pain.        . potassium chloride SA (K-DUR,KLOR-CON) 20 MEQ tablet Take 1 tablet (20 mEq total) by mouth daily as needed (for muscle pain).  30 tablet  3  . predniSONE (DELTASONE) 20 MG tablet Take 2 tablets (40 mg total) by mouth daily.  14 tablet  0   No current facility-administered medications for this encounter.    Allergies  Allergen Reactions  . Lipitor (Atorvastatin Calcium) Anaphylaxis    Large doses  . Sulfonamide Derivatives Shortness Of Breath  . Iohexol      Desc: PT HAS ANAPHYLAXIS WITH CONTRAST MEDIA!, Onset Date: 16109604   Code: SOB, Desc: ok w/ 13hr prep//a.c., Onset Date: 54098119   . Latex Other (See Comments)    With long periods of exposure REACTION: Reaction not known  . Zocor (Simvastatin) Other (See Comments)    Muscle cramps    Vital Signs: Filed Vitals:   07/29/13 0940  BP: 120/68  Pulse: 71  Weight: 182 lb 12.8 oz (82.918 kg)  SpO2: 96%   PHYSICAL EXAM:  Physical Exam: General:  Well appearing. No resp difficulty HEENT: normal Neck: supple. JVP 5-6. Carotids 2+ bilat; carotid bruits L>R No lymphadenopathy or thryomegaly appreciated. Cor: PMI nondisplaced. Regular rate & rhythm. 2/6 SEM RSB +s4 Lungs: clear Abdomen: soft, nontender, nondistended. No hepatosplenomegaly. No bruits or masses. Good bowel sounds. Extremities: no cyanosis, clubbing, rash, tr edema Neuro: alert & orientedx3, cranial nerves grossly intact. moves all 4 extremities w/o difficulty. Affect pleasant  ASSESSMENT:  1. 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 CAD s/p CABG 3. H/o ascending aortic dissection s/p repair 4. 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 HTN 6. Hyperlipidemia  PLAN/DISCUSSION:  Doing very well from HF standpoint. Volume status looks good. NYHA I. No anginal sx. BP running lower so will stop amlodipine. Needs f/u lipids and CMET. F/u 4 months.  Daniel Bensimhon,MD 1:55 PM

## 2013-07-30 MED ORDER — LORATADINE 10 MG PO TABS: 10.0000 mg | ORAL_TABLET | Freq: Every day | ORAL | Status: DC | PRN

## 2013-08-04 ENCOUNTER — Other Ambulatory Visit: Payer: BC Managed Care – PPO

## 2013-09-10 ENCOUNTER — Encounter: Payer: Self-pay | Admitting: *Deleted

## 2013-09-26 ENCOUNTER — Other Ambulatory Visit: Payer: Self-pay | Admitting: *Deleted

## 2013-09-26 DIAGNOSIS — I7103 Dissection of thoracoabdominal aorta: Secondary | ICD-10-CM

## 2013-10-10 ENCOUNTER — Other Ambulatory Visit: Payer: Self-pay | Admitting: *Deleted

## 2013-10-10 DIAGNOSIS — Z91041 Radiographic dye allergy status: Secondary | ICD-10-CM

## 2013-10-10 MED ORDER — PREDNISONE (PAK) 10 MG PO TABS: 50.0000 mg | ORAL_TABLET | ORAL | Status: DC

## 2013-10-10 MED ORDER — DIPHENHYDRAMINE HCL 25 MG PO CAPS: 50.0000 mg | ORAL_CAPSULE | ORAL | Status: DC

## 2013-10-15 ENCOUNTER — Ambulatory Visit (HOSPITAL_COMMUNITY)
Admission: RE | Admit: 2013-10-15 | Discharge: 2013-10-15 | Disposition: A | Discharge status: Home / Self Care | Payer: Medicaid Other | Source: Ambulatory Visit | Attending: Cardiothoracic Surgery | Admitting: Cardiothoracic Surgery

## 2013-10-15 ENCOUNTER — Encounter (HOSPITAL_COMMUNITY): Payer: Self-pay

## 2013-10-15 ENCOUNTER — Ambulatory Visit: Payer: BC Managed Care – PPO | Admitting: Cardiothoracic Surgery

## 2013-10-15 DIAGNOSIS — I7103 Dissection of thoracoabdominal aorta: Secondary | ICD-10-CM

## 2013-10-15 DIAGNOSIS — I7101 Dissection of thoracic aorta: Secondary | ICD-10-CM | POA: Insufficient documentation

## 2013-10-15 DIAGNOSIS — I71019 Dissection of thoracic aorta, unspecified: Secondary | ICD-10-CM | POA: Insufficient documentation

## 2013-10-15 MED ORDER — IOHEXOL 350 MG/ML SOLN
100.0000 mL | Freq: Once | INTRAVENOUS | Status: AC | PRN
  Administered 2013-10-15: 100 mL via INTRAVENOUS

## 2013-10-22 ENCOUNTER — Ambulatory Visit: Payer: BC Managed Care – PPO | Admitting: Cardiothoracic Surgery

## 2013-10-29 ENCOUNTER — Other Ambulatory Visit (HOSPITAL_COMMUNITY): Payer: Self-pay | Admitting: *Deleted

## 2013-10-29 ENCOUNTER — Other Ambulatory Visit (HOSPITAL_COMMUNITY): Payer: Self-pay | Admitting: Internal Medicine

## 2013-10-29 MED ORDER — CARVEDILOL 25 MG PO TABS: ORAL_TABLET | ORAL | Status: DC

## 2013-11-12 ENCOUNTER — Encounter: Payer: Self-pay | Admitting: Cardiothoracic Surgery

## 2013-11-12 ENCOUNTER — Ambulatory Visit (INDEPENDENT_AMBULATORY_CARE_PROVIDER_SITE_OTHER): Payer: Medicaid Other | Admitting: Cardiothoracic Surgery

## 2013-11-12 VITALS — BP 153/87 | HR 78 | Resp 19 | Ht 70.5 in | Wt 175.0 lb

## 2013-11-12 DIAGNOSIS — I7100 Dissection of unspecified site of aorta: Secondary | ICD-10-CM

## 2013-11-12 NOTE — Progress Notes (Signed)
PCP is Marena Chancy, MD Referring Provider is Rodolph Bong, MD  Chief Complaint  Patient presents with  . 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 Patient's EF is 30-35% he is followed in the heart failure clinic by Dr Gala Romney. He is been carefully monitoring his blood pressure and weight. He is not smoking.  CTA of the thoracic aorta shows his ascending aortic repair to be intact without pseudo- aneurysm or hematoma. The false lumen in the distal arch and descending thoracic aorta is stable. The transverse diameter of the arch is 3.8 cm. The transverse diameter of the descending thoracic aorta is 3.6 cm. The descending thoracic aorta has not changed over the past 4 years.   Past Medical History  Diagnosis Date  . CAD (coronary artery disease)     a. s/p CABG 2006;  b. DES to PDA 2011 (cath: Dx not seen, dRCA/PDA tx with DES; S-PDA occluded (culprit), S-Dx occluded, S-RI and OM ok, L-LAD ok  . HTN (hypertension)     severe  . Chronic systolic heart failure     a. 40/98 ECHO: EF 35-40%, sept, apical & posterobasal HK, LV mod dil & sys fx mod reduced, mild AI, MV mild reg, TV mild reg  . 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 CRI (chronic renal insufficiency)   . Thyroid cancer     Hertle Cell  . HLD (hyperlipidemia)   . Anemia   . Gout   . AICD (automatic cardioverter/defibrillator) present   . Carotid stenosis     dopplers 2011: 0-39% bilat.  . Chest pain syndrome     Past Surgical History  Procedure Laterality Date  . Coronary artery bypass graft  06/21/2009    x2  . Cardiac defibrillator placement      [employer]  . Thyroidectomy, partial  06/20/11  . Lumbar disc surgery      x 2, most recent  within 5-10 years  . Status post emergency repair of a type a ascending aortic dissection with a hemiarch reconstruction of the ascending aorta  using a 28-mm hemashield graft with redo sternotomy and revision of previous bypass grafts in june 2010.    . Median sternotomy,cabg x 7  06/29/2005    Family History  Problem Relation Age of Onset  . Coronary artery disease    . Hypertension Father   . Heart disease Father   . Early death Father   . COPD Father   . Hypertension Mother     Social History History  Substance Use Topics  . Smoking status: Never Smoker   . Smokeless tobacco: Never Used  . Alcohol Use: No    Current Outpatient Prescriptions  Medication Sig Dispense Refill  . aspirin EC 81 MG tablet Take 162 mg by mouth 2 (two) times daily.       [name] atorvastatin (LIPITOR) 40 MG tablet Take 0.5 tablets (20 mg total) by mouth 2 (two) times a week.  10 tablet  3  . carvedilol (COREG) 25 MG tablet TAKE ONE TABLET BY MOUTH TWICE DAILY WITH MEALS  60 tablet  3  . clopidogrel (PLAVIX) 75 MG tablet TAKE ONE TABLET BY MOUTH ONCE DAILY  30 tablet  3  . cyclobenzaprine (FLEXERIL) 10 MG tablet Take 10 mg by mouth 2 (two) times daily as needed for muscle spasms.       . diphenhydrAMINE (BENADRYL) 25 mg capsule Take 2 capsules (50 mg total) by mouth as directed.  2 capsule  0  . docusate sodium (COLACE) 100 MG capsule Take 100 mg by mouth daily.       . furosemide (LASIX) 40 MG tablet Take 1 tablet (40 mg total) by mouth daily as needed.  30 tablet  3  . gabapentin (NEURONTIN) 300 MG capsule TAKE ONE CAPSULE BY MOUTH AT BEDTIME  30 capsule  2  . hydrALAZINE (APRESOLINE) 25 MG tablet Take 3 tablets (75 mg total) by mouth 2 (two) times daily.  180 tablet  3  . isosorbide mononitrate (IMDUR) 30 MG 24 hr tablet TAKE ONE TABLET BY MOUTH TWICE DAILY  60 tablet  3  . lisinopril (PRINIVIL,ZESTRIL) 20 MG tablet Take 1 tablet (20 mg total) by mouth daily.  30 tablet  3  . loratadine (CLARITIN) 10 MG  tablet Take 1 tablet (10 mg total) by mouth daily as needed for allergies.  30 tablet  11  . nitroGLYCERIN (NITROSTAT) 0.4 MG SL tablet Place 1 tablet (0.4 mg total) under the tongue every 5 (five) minutes as needed for chest pain.  25 tablet  3  . oxycodone (ROXICODONE) 30 MG immediate release tablet Take 30 mg by mouth every 8 (eight) hours as needed for pain.       . potassium chloride SA (K-DUR,KLOR-CON) 20 MEQ tablet Take 1 tablet (20 mEq total) by mouth daily as needed (for muscle pain).  30 tablet  3  . predniSONE (STERAPRED UNI-PAK) 10 MG tablet Take 5 tablets (50 mg total) by mouth as directed.  15 tablet  0   No current facility-administered medications for this visit.    Allergies  Allergen Reactions  . Lipitor [Atorvastatin Calcium] Anaphylaxis    Large doses  . Sulfonamide Derivatives Shortness Of Breath  . Iohexol      Desc: PT HAS ANAPHYLAXIS WITH CONTRAST MEDIA!, Onset Date: 78295621   Code: SOB, Desc: ok w/ 13hr prep//a.c., Onset Date: 30865784   . Latex Other (See Comments)    With long periods of exposure REACTION: Reaction not known  . Zocor [Simvastatin] Other (See Comments)    Muscle cramps    Review of Systems  Feels well. Denies angina or CHF symptoms. No postop surgical pain. 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 The ascending aortic repair is stable.  Impression: Continue blood pressure management and  careful to avoid heavy lifting--patient understands the importance of both these things.  Plan: Plan on having the patient return in 2 years with CTA of the thoracic aorta unless the patient develops symptoms  in the interim.

## 2013-11-24 ENCOUNTER — Telehealth (HOSPITAL_COMMUNITY): Payer: Self-pay | Admitting: Cardiology

## 2013-11-24 DIAGNOSIS — I5022 Chronic systolic (congestive) heart failure: Secondary | ICD-10-CM

## 2013-11-24 NOTE — Telephone Encounter (Signed)
Pt called to request a refill and increase on Potassium Pt states he is currently taking 40 meQ BID most days even though order is for daily Pt states he increases it to this dose because of leg cramps  Informed pt since labs have not been done in the past 90 days, I don't feel comfortable increasing dose. Advised pt to have labs done ASAP and we can address potassium at that point.  Pt requested current RX of daily prn be refilled, he cannot go another day with out meds Pt added to lab schedule at [facility]  Please advise regarding refill

## 2013-11-25 ENCOUNTER — Other Ambulatory Visit: Payer: Medicaid Other

## 2013-11-25 DIAGNOSIS — I25810 Atherosclerosis of coronary artery bypass graft(s) without angina pectoris: Secondary | ICD-10-CM

## 2013-11-25 LAB — LIPID PANEL
Cholesterol: 186 mg/dL (ref 0–200)
HDL: 49.8 mg/dL (ref >39.00)
LDL Cholesterol: 112 mg/dL — ABNORMAL HIGH (ref 0–99)
Total CHOL/HDL Ratio: 4
VLDL: 24.2 mg/dL (ref 0.0–40.0)

## 2013-11-27 ENCOUNTER — Ambulatory Visit: Payer: Medicaid Other

## 2013-11-28 ENCOUNTER — Encounter (HOSPITAL_COMMUNITY): Payer: Self-pay | Admitting: Anesthesiology

## 2013-11-28 ENCOUNTER — Telehealth (HOSPITAL_COMMUNITY): Payer: Self-pay | Admitting: Anesthesiology

## 2013-11-28 MED ORDER — POTASSIUM CHLORIDE CRYS ER 20 MEQ PO TBCR: 20.0000 meq | EXTENDED_RELEASE_TABLET | Freq: Every day | ORAL | Status: DC | PRN

## 2013-11-28 NOTE — Telephone Encounter (Signed)
Spoke w/pt instead of bmet a lipid panel was done pt aware of results, advised I can only send in order for 20 meq daily, he is agreeable so order for KCL 20 meq daily sent to pharmacy and pt will go for bmet on Tue

## 2013-11-28 NOTE — Telephone Encounter (Signed)
Called patient about labs. LDL 112 and hx of CAD. He currently is taking atorvastatin 40 mg daily. Asked patient to increase to 80 mg daily, however he reports myalgias in the past. Will keep current dose.  Patient is asking to refill potassium. Told him we can refill it for 20 meq daily, but will not refill for 40 meq BID because he does not have recent BMET. Unfortunately he went this past week and had lipid profile, but not BMET. Will place order for BMET next week and if he needs more KCL will adjust.  Ulla Potash B NP-C 12:22 PM

## 2013-12-02 ENCOUNTER — Other Ambulatory Visit: Payer: Medicaid Other

## 2013-12-04 ENCOUNTER — Other Ambulatory Visit (HOSPITAL_COMMUNITY): Payer: Self-pay | Admitting: *Deleted

## 2013-12-04 MED ORDER — CARVEDILOL 25 MG PO TABS: ORAL_TABLET | ORAL | Status: DC

## 2013-12-09 ENCOUNTER — Other Ambulatory Visit (INDEPENDENT_AMBULATORY_CARE_PROVIDER_SITE_OTHER): Payer: Medicaid Other

## 2013-12-09 DIAGNOSIS — I5022 Chronic systolic (congestive) heart failure: Secondary | ICD-10-CM

## 2013-12-09 LAB — BASIC METABOLIC PANEL
BUN: 12 mg/dL (ref 6–23)
Calcium: 8.8 mg/dL (ref 8.4–10.5)
GFR: 76.91 mL/min (ref >60.00)
Glucose, Bld: 91 mg/dL (ref 70–99)
Potassium: 3.6 mEq/L (ref 3.5–5.1)
Sodium: 139 mEq/L (ref 135–145)

## 2013-12-16 ENCOUNTER — Ambulatory Visit (HOSPITAL_COMMUNITY)
Admission: RE | Admit: 2013-12-16 | Discharge: 2013-12-16 | Disposition: A | Discharge status: Home / Self Care | Payer: Medicaid Other | Source: Ambulatory Visit | Attending: Internal Medicine | Admitting: Internal Medicine

## 2013-12-16 VITALS — BP 144/84 | HR 88 | Wt 183.5 lb

## 2013-12-16 DIAGNOSIS — I10 Essential (primary) hypertension: Secondary | ICD-10-CM

## 2013-12-16 DIAGNOSIS — N189 Chronic kidney disease, unspecified: Secondary | ICD-10-CM | POA: Insufficient documentation

## 2013-12-16 DIAGNOSIS — I5022 Chronic systolic (congestive) heart failure: Secondary | ICD-10-CM

## 2013-12-16 DIAGNOSIS — I129 Hypertensive chronic kidney disease with stage 1 through stage 4 chronic kidney disease, or unspecified chronic kidney disease: Secondary | ICD-10-CM | POA: Insufficient documentation

## 2013-12-16 DIAGNOSIS — N182 Chronic kidney disease, stage 2 (mild): Secondary | ICD-10-CM

## 2013-12-16 DIAGNOSIS — I2510 Atherosclerotic heart disease of native coronary artery without angina pectoris: Secondary | ICD-10-CM | POA: Insufficient documentation

## 2013-12-16 DIAGNOSIS — E785 Hyperlipidemia, unspecified: Secondary | ICD-10-CM

## 2013-12-16 MED ORDER — ASPIRIN EC 81 MG PO TBEC: 81.0000 mg | DELAYED_RELEASE_TABLET | Freq: Every day | ORAL | Status: DC

## 2013-12-16 MED ORDER — EZETIMIBE 10 MG PO TABS: 10.0000 mg | ORAL_TABLET | Freq: Every day | ORAL | Status: DC

## 2013-12-16 NOTE — Progress Notes (Signed)
Patient ID: Chad [name], male   DOB: 1961-12-14, 52 y.o.   MRN: [id number]  History of Present Illness: Primary Cardiologist:  Dr. Arvilla Meres  Chad [name] is a 52 y.o. male with history of severe HTN, coronary artery disease status post previous myocardial infarction and bypass surgery in 2006 with DES to native PDA in 2011.  He also has a history congestive heart failure secondary to ischemic cardiomyopathy with EF in 30-35% range.  He is s/p single chamber ICD.  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 He underwent emergent repair of proximal aorta and reimplantation of his CABG grafts.  Carotids u/s in 2011 0-39%.  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 Also with significant low back pain s/p 2 surgeries  ETT/Myoview in 9/12: Walked 8:46 on [place]. Moderate-sized fixed inferior perfusion defect consistent with prior MI. EF 31% with septal hypokinesis. There was also inferior hypokinesis. Similar to prior study.   11/26/12 ECHO EF 35-40%   He returns for follow up. Mild dyspnea with exertion (chronic). Denies CP/PND/Orthopnea. No ICD shocks. Weight at home 175-178 pounds. He takes lasix about twice a month.   Walks a couple hours. Taking all medications. Intolerant statins due to myalgias. SBP at home < 110.     Labs 11/25/13 Cholesterol 186 TG 121 HDL 49  Labs: 12/09/13 K 3.6 creatinine 1.3   ROS: All pertinent positives and negatives as in HPI, otherwise negative.    Past Medical History  Diagnosis Date  . CAD (coronary artery disease)     a. s/p CABG 2006;  b. DES to PDA 2011 (cath: Dx not seen, dRCA/PDA tx with DES; S-PDA occluded (culprit), S-Dx occluded, S-RI and OM ok, L-LAD ok  . HTN (hypertension)     severe  . Chronic systolic heart failure     a. 78/29 ECHO: EF 35-40%, sept, apical & posterobasal HK, LV mod dil & sys fx mod reduced, mild AI, MV mild reg, TV mild reg  . Aortic dissection, thoracoabdominal      7/10: Type I s/p repair  . CRI (chronic renal insufficiency)   . Thyroid cancer     Hertle Cell  . HLD (hyperlipidemia)   . Anemia   . Gout   . AICD (automatic cardioverter/defibrillator) present   . Carotid stenosis     dopplers 2011: 0-39% bilat.  . Chest pain syndrome     Current Outpatient Prescriptions  Medication Sig Dispense Refill  . amLODipine (NORVASC) 5 MG tablet Take 5 mg by mouth 2 (two) times daily.      [name] aspirin EC 325 MG tablet Take 325 mg by mouth daily.      [name] b complex vitamins tablet Take 1 tablet by mouth daily.      . carvedilol (COREG) 25 MG tablet TAKE ONE TABLET BY MOUTH TWICE DAILY WITH MEALS  60 tablet  3  . clopidogrel (PLAVIX) 75 MG tablet TAKE ONE TABLET BY MOUTH ONCE DAILY  30 tablet  3  . cyclobenzaprine (FLEXERIL) 10 MG tablet Take 10 mg by mouth 2 (two) times daily as needed for muscle spasms.       . digoxin (LANOXIN) 0.25 MG tablet Take 0.25 mg by mouth daily.      . diphenhydrAMINE (BENADRYL) 25 mg capsule Take 2 capsules (50 mg total) by mouth as directed.  2 capsule  0  . docusate sodium (COLACE) 100 MG capsule Take 100 mg  by mouth as needed.       . furosemide (LASIX) 40 MG tablet Take 1 tablet (40 mg total) by mouth daily as needed.  30 tablet  3  . gabapentin (NEURONTIN) 300 MG capsule TAKE ONE CAPSULE BY MOUTH AT BEDTIME  30 capsule  2  . hydrALAZINE (APRESOLINE) 25 MG tablet Take 50 mg by mouth 2 (two) times daily.      . isosorbide mononitrate (IMDUR) 30 MG 24 hr tablet TAKE ONE TABLET BY MOUTH TWICE DAILY  60 tablet  3  . loratadine (CLARITIN) 10 MG tablet Take 1 tablet (10 mg total) by mouth daily as needed for allergies.  30 tablet  11  . nitroGLYCERIN (NITROSTAT) 0.4 MG SL tablet Place 1 tablet (0.4 mg total) under the tongue every 5 (five) minutes as needed for chest pain.  25 tablet  3  . oxycodone (ROXICODONE) 30 MG immediate release tablet Take 30 mg by mouth every 8 (eight) hours as needed for pain.       . potassium chloride  SA (K-DUR,KLOR-CON) 20 MEQ tablet Take 1 tablet (20 mEq total) by mouth daily as needed (for muscle pain).  30 tablet  3   No current facility-administered medications for this encounter.    Allergies  Allergen Reactions  . Lipitor [Atorvastatin Calcium] Anaphylaxis    Large doses  . Sulfonamide Derivatives Shortness Of Breath  . Iohexol      Desc: PT HAS ANAPHYLAXIS WITH CONTRAST MEDIA!, Onset Date: 40981191   Code: SOB, Desc: ok w/ 13hr prep//a.c., Onset Date: 47829562   . Latex Other (See Comments)    With long periods of exposure REACTION: Reaction not known  . Zocor [Simvastatin] Other (See Comments)    Muscle cramps    Vital Signs: Filed Vitals:   12/16/13 1109  BP: 144/84  Pulse: 88  Weight: 183 lb 8 oz (83.235 kg)  SpO2: 94%   PHYSICAL EXAM:  Physical Exam: General:  Well appearing. No resp difficulty HEENT: normal Neck: supple. JVP 5-6. Carotids 2+ bilat; carotid bruits L>R No lymphadenopathy or thryomegaly appreciated. Cor: PMI nondisplaced. Regular rate & rhythm. 2/6 SEM RSB  Lungs: clear Abdomen: soft, nontender, nondistended. No hepatosplenomegaly. No bruits or masses. Good bowel sounds. Extremities: no cyanosis, clubbing, rash, tr edema Neuro: alert & orientedx3, cranial nerves grossly intact. moves all 4 extremities w/o difficulty. Affect pleasant  ASSESSMENT/Plan 1. Chronic systolic HF due to ICM ECHO 11/26/12 EF 35-40%     --S/P ICD. NYHA II. Volume status stable. Continue lasix as needed.  On goal carvedilol 25 mg twice a day Continue hydralazine 50 mg twice a day/IMDUR 30 mg twice a day Not on an Ace due to CKD.   2. CAD- s/p CABG ECHO EF 35-40. Taking 325 mg aspirin will drop to 81 mg daily. Continue plavix daily.  3. 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 Hyperlipidemia- intolerant to all statins despite multiple attempts. Labs 11/25/13 Cholesterol 186 TG 121 HDL 49  Try zetia 10 mg daily  Check lipids and LFTs in 2 months.  5. HTN   - BP slightly elevated here but well controlled on home cuff. Will bring his cuff to clinic to calibrate.  Follow up in 6 months will need ECHO   CLEGG,AMY NP-C 11:32 AM  Patient seen and examined with Tonye Becket, NP. We discussed all aspects of the encounter. I agree with the assessment and plan as stated above.  Doing very well. No HF or anginal symptoms. Agree with starting Zetia (particularly in light of recent [employer] data) as he has failed multiple statins. BP up here but very well controlled at home. Have asked him to bring his BP cuff into clinic next week to calibrate.   Daniel Bensimhon,MD 11:46 AM

## 2013-12-16 NOTE — Patient Instructions (Addendum)
Take zetia 10 mg daily  Stop 325 mg aspirin.   Take 81 mg aspirin  Your physician recommends that you return for a FASTING lipid profile: 2 months  (Monday, Febuary 16th) [facility]  Do the following things EVERYDAY: 1) Weigh yourself in the morning before breakfast. Write it down and keep it in a log. 2) Take your medicines as prescribed 3) Eat low salt foods-Limit salt (sodium) to 2000 mg per day.  4) Stay as active as you can everyday 5) Limit all fluids for the day to less than 2 liters

## 2013-12-24 ENCOUNTER — Telehealth (HOSPITAL_COMMUNITY): Payer: Self-pay

## 2013-12-24 NOTE — Telephone Encounter (Signed)
Refill authorization request approved for zetia, confirmation # [id number], multiple attempts to call [facility] [phone] pharmacy to make aware, no answer.  Confirmation # copied to refill authorization request and faxed to pharmacy. [address] [facility]

## 2013-12-30 ENCOUNTER — Telehealth (HOSPITAL_COMMUNITY): Payer: Self-pay | Admitting: Cardiology

## 2013-12-30 NOTE — Telephone Encounter (Signed)
Spoke w/pt, he states he weighed with clothes on today, he denies SOB or edema

## 2013-12-30 NOTE — Telephone Encounter (Signed)
Ann called to report a 5 lb weight gain in the past 4 days Weight 1/1   175.5   1/6    180.5  Please advise

## 2014-03-03 ENCOUNTER — Encounter: Payer: Self-pay | Admitting: *Deleted

## 2014-03-06 ENCOUNTER — Ambulatory Visit (INDEPENDENT_AMBULATORY_CARE_PROVIDER_SITE_OTHER): Payer: Medicaid Other | Admitting: *Deleted

## 2014-03-06 ENCOUNTER — Encounter: Payer: Self-pay | Admitting: *Deleted

## 2014-03-06 DIAGNOSIS — Z23 Encounter for immunization: Secondary | ICD-10-CM

## 2014-03-11 ENCOUNTER — Emergency Department (HOSPITAL_COMMUNITY): Payer: Medicaid Other

## 2014-03-11 ENCOUNTER — Encounter (HOSPITAL_COMMUNITY): Payer: Self-pay | Admitting: Emergency Medicine

## 2014-03-11 ENCOUNTER — Emergency Department (HOSPITAL_COMMUNITY)
Admission: EM | Admit: 2014-03-11 | Discharge: 2014-03-11 | Discharge status: Against Medical Advice | Payer: Medicaid Other | Attending: Emergency Medicine | Admitting: Emergency Medicine

## 2014-03-11 DIAGNOSIS — Z95810 Presence of automatic (implantable) cardiac defibrillator: Secondary | ICD-10-CM | POA: Insufficient documentation

## 2014-03-11 DIAGNOSIS — Z951 Presence of aortocoronary bypass graft: Secondary | ICD-10-CM | POA: Insufficient documentation

## 2014-03-11 DIAGNOSIS — I129 Hypertensive chronic kidney disease with stage 1 through stage 4 chronic kidney disease, or unspecified chronic kidney disease: Secondary | ICD-10-CM | POA: Insufficient documentation

## 2014-03-11 DIAGNOSIS — N189 Chronic kidney disease, unspecified: Secondary | ICD-10-CM | POA: Insufficient documentation

## 2014-03-11 DIAGNOSIS — I2510 Atherosclerotic heart disease of native coronary artery without angina pectoris: Secondary | ICD-10-CM | POA: Insufficient documentation

## 2014-03-11 DIAGNOSIS — R55 Syncope and collapse: Secondary | ICD-10-CM | POA: Insufficient documentation

## 2014-03-11 DIAGNOSIS — I5022 Chronic systolic (congestive) heart failure: Secondary | ICD-10-CM | POA: Insufficient documentation

## 2014-03-11 LAB — CBC WITH DIFFERENTIAL/PLATELET
BASOS PCT: 1 % (ref 0–1)
Basophils Absolute: 0 10*3/uL (ref 0.0–0.1)
EOS ABS: 0.4 10*3/uL (ref 0.0–0.7)
Eosinophils Relative: 10 % — ABNORMAL HIGH (ref 0–5)
HCT: 39.8 % (ref 39.0–52.0)
Hemoglobin: 13.4 g/dL (ref 13.0–17.0)
LYMPHS ABS: 1.9 10*3/uL (ref 0.7–4.0)
Lymphocytes Relative: 52 % — ABNORMAL HIGH (ref 12–46)
MCH: 27.2 pg (ref 26.0–34.0)
MCHC: 33.7 g/dL (ref 30.0–36.0)
MCV: 80.9 fL (ref 78.0–100.0)
MONO ABS: 0.3 10*3/uL (ref 0.1–1.0)
Monocytes Relative: 9 % (ref 3–12)
NEUTROS PCT: 28 % — AB (ref 43–77)
Neutro Abs: 1 10*3/uL — ABNORMAL LOW (ref 1.7–7.7)
Platelets: 165 10*3/uL (ref 150–400)
RBC: 4.92 MIL/uL (ref 4.22–5.81)
RDW: 14.4 % (ref 11.5–15.5)
WBC: 3.6 10*3/uL — ABNORMAL LOW (ref 4.0–10.5)

## 2014-03-11 LAB — PRO B NATRIURETIC PEPTIDE: Pro B Natriuretic peptide (BNP): 589.1 pg/mL — ABNORMAL HIGH (ref 0–125)

## 2014-03-11 LAB — BASIC METABOLIC PANEL
BUN: 20 mg/dL (ref 6–23)
CO2: 28 mEq/L (ref 19–32)
CREATININE: 1.4 mg/dL — AB (ref 0.50–1.35)
Calcium: 9.3 mg/dL (ref 8.4–10.5)
Chloride: 99 mEq/L (ref 96–112)
GFR calc Af Amer: 66 mL/min — ABNORMAL LOW (ref >90)
GFR, EST NON AFRICAN AMERICAN: 57 mL/min — AB (ref >90)
Glucose, Bld: 93 mg/dL (ref 70–99)
Potassium: 4.1 mEq/L (ref 3.7–5.3)
Sodium: 140 mEq/L (ref 137–147)

## 2014-03-11 LAB — I-STAT TROPONIN, ED: Troponin i, poc: 0 ng/mL (ref 0.00–0.08)

## 2014-03-11 NOTE — ED Notes (Signed)
Pt presents to department for evaluation of SOB and near syncope. Onset today. Reports he feels like he is going to pass out. Denies pain at the time. Respirations unlabored. Pt is alert and oriented x4. No neurological deficits noted.

## 2014-03-16 ENCOUNTER — Encounter (HOSPITAL_COMMUNITY): Payer: Self-pay

## 2014-03-16 ENCOUNTER — Ambulatory Visit (HOSPITAL_COMMUNITY)
Admission: RE | Admit: 2014-03-16 | Discharge: 2014-03-16 | Disposition: A | Discharge status: Home / Self Care | Payer: Medicaid Other | Source: Ambulatory Visit | Attending: Internal Medicine | Admitting: Internal Medicine

## 2014-03-16 VITALS — BP 136/88 | HR 65 | Wt 178.8 lb

## 2014-03-16 DIAGNOSIS — I7103 Dissection of thoracoabdominal aorta: Secondary | ICD-10-CM

## 2014-03-16 DIAGNOSIS — I714 Abdominal aortic aneurysm, without rupture, unspecified: Secondary | ICD-10-CM | POA: Insufficient documentation

## 2014-03-16 DIAGNOSIS — I10 Essential (primary) hypertension: Secondary | ICD-10-CM

## 2014-03-16 DIAGNOSIS — I5021 Acute systolic (congestive) heart failure: Secondary | ICD-10-CM | POA: Insufficient documentation

## 2014-03-16 DIAGNOSIS — I5022 Chronic systolic (congestive) heart failure: Secondary | ICD-10-CM

## 2014-03-16 DIAGNOSIS — I129 Hypertensive chronic kidney disease with stage 1 through stage 4 chronic kidney disease, or unspecified chronic kidney disease: Secondary | ICD-10-CM | POA: Insufficient documentation

## 2014-03-16 DIAGNOSIS — N182 Chronic kidney disease, stage 2 (mild): Secondary | ICD-10-CM | POA: Insufficient documentation

## 2014-03-16 MED ORDER — CLOPIDOGREL BISULFATE 75 MG PO TABS
75.0000 mg | ORAL_TABLET | Freq: Every day | ORAL | Status: DC
Start: 2014-03-16 — End: 2014-06-25

## 2014-03-16 MED ORDER — CLOPIDOGREL BISULFATE 75 MG PO TABS: 75.0000 mg | ORAL_TABLET | Freq: Every day | ORAL | Status: DC

## 2014-03-16 MED ORDER — POTASSIUM CHLORIDE CRYS ER 20 MEQ PO TBCR: 20.0000 meq | EXTENDED_RELEASE_TABLET | Freq: Every day | ORAL | Status: DC | PRN

## 2014-03-16 MED ORDER — ASPIRIN 81 MG PO TABS: 81.0000 mg | ORAL_TABLET | Freq: Every day | ORAL | Status: DC

## 2014-03-16 NOTE — Patient Instructions (Signed)
Restart Plavix 75 mg daily Start Aspirin 81 mg daily  Your physician recommends that you schedule a follow-up appointment in: 1 month

## 2014-03-16 NOTE — Progress Notes (Signed)
Patient ID: Chad [name], male   DOB: 1961-04-30, 53 y.o.   MRN: [id number]  History of Present Illness: Primary Cardiologist:  Dr. Demetrios Loll is a 53 y.o. male with history of severe HTN, coronary artery disease status post previous myocardial infarction and bypass surgery in 2006 with DES to native PDA in 2011.  He also has a history congestive heart failure secondary to ischemic cardiomyopathy with EF in 30-35% range.  He is s/p single chamber ICD.  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 He underwent emergent repair of proximal aorta and reimplantation of his CABG grafts.  Carotids u/s in 2011 0-39%.  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 Also with significant low back pain s/p 2 surgeries  ETT/Myoview in 9/12: Walked 8:46 on [place]. Moderate-sized fixed inferior perfusion defect consistent with prior MI. EF 31% with septal hypokinesis. There was also inferior hypokinesis. Similar to prior study.   11/26/12 ECHO EF 35-40%   He returns for follow up. Last week, he felt "bad."  He was lightheaded/dizzy and was short of breath at times.  He decreased his hydralazine to 50 mg tid because he thought that his BP was getting too low at times.  Since then, he has felt better.  Currently, no lightheadedness.  BP is not elevated.  He is short of breath if he walks fast.  He can climb a flight of steps without lightheadedness.  Weight is down 5 lbs since last appointment.  He takes Lasix only rarely (none recently).  He never started Zetia, he has run out of Plavix, and he is now off digoxin.     Labs 11/25/13 Cholesterol 186 TG 121 HDL 49  Labs 12/09/13 K 3.6 creatinine 1.3  Labs 3/15 K 4.1, creatinine 1.4, BNP 589, HCT 39.8  ROS: All pertinent positives and negatives as in HPI, otherwise negative.    Past Medical History  Diagnosis Date  . CAD (coronary artery disease)     a. s/p CABG 2006;  b. DES to PDA 2011  (cath: Dx not seen, dRCA/PDA tx with DES; S-PDA occluded (culprit), S-Dx occluded, S-RI and OM ok, L-LAD ok  . HTN (hypertension)     severe  . Chronic systolic heart failure     a. 12/13 ECHO: EF 35-40%, sept, apical & posterobasal HK, LV mod dil & sys fx mod reduced, mild AI, MV mild reg, TV mild reg  . 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 CRI (chronic renal insufficiency)   . Thyroid cancer     Hertle Cell  . HLD (hyperlipidemia)   . Anemia   . Gout   . AICD (automatic cardioverter/defibrillator) present   . Carotid stenosis     dopplers 2011: 0-39% bilat.  . Chest pain syndrome     Current Outpatient Prescriptions  Medication Sig Dispense Refill  . amLODipine (NORVASC) 5 MG tablet Take 5 mg by mouth 2 (two) times daily.      [name] aspirin 81 MG tablet Take 1 tablet (81 mg total) by mouth daily.      [name] b complex vitamins tablet Take 1 tablet by mouth daily.      . carvedilol (COREG) 25 MG tablet Take 25 mg by mouth 2 (two) times daily with a meal.      . cyclobenzaprine (FLEXERIL) 10 MG tablet Take 10 mg by mouth 2 (two) times  daily as needed for muscle spasms.       [name] docusate sodium (COLACE) 100 MG capsule Take 100 mg by mouth as needed for mild constipation.       . furosemide (LASIX) 40 MG tablet Take 40 mg by mouth daily as needed for fluid.      [name] gabapentin (NEURONTIN) 300 MG capsule Take 300 mg by mouth at bedtime.      . hydrALAZINE (APRESOLINE) 25 MG tablet Take 50 mg by mouth 3 (three) times daily.       . isosorbide mononitrate (IMDUR) 30 MG 24 hr tablet Take 30 mg by mouth 2 (two) times daily.      [name] loratadine (CLARITIN) 10 MG tablet Take 1 tablet (10 mg total) by mouth daily as needed for allergies.  30 tablet  11  . nitroGLYCERIN (NITROSTAT) 0.4 MG SL tablet Place 1 tablet (0.4 mg total) under the tongue every 5 (five) minutes as needed for chest pain.  25 tablet  3  . oxycodone (ROXICODONE) 30 MG immediate release tablet Take 30 mg by  mouth every 8 (eight) hours as needed for pain.       . potassium chloride SA (K-DUR,KLOR-CON) 20 MEQ tablet Take 1 tablet (20 mEq total) by mouth daily as needed (for muscle pain).  60 tablet  3  . clopidogrel (PLAVIX) 75 MG tablet Take 1 tablet (75 mg total) by mouth daily.  30 tablet  6   No current facility-administered medications for this encounter.    Allergies  Allergen Reactions  . Lipitor [Atorvastatin Calcium] Anaphylaxis    Large doses  . Sulfonamide Derivatives Shortness Of Breath  . Iohexol      Desc: PT HAS ANAPHYLAXIS WITH CONTRAST MEDIA!, Onset Date: 94765465   Code: SOB, Desc: ok w/ 13hr prep//a.c., Onset Date: 03546568   . Latex Other (See Comments)    With long periods of exposure REACTION: Reaction not known  . Zocor [Simvastatin] Other (See Comments)    Muscle cramps    Vital Signs: Filed Vitals:   03/16/14 1113  BP: 136/88  Pulse: 65  Weight: 178 lb 12.8 oz (81.103 kg)  SpO2: 95%   PHYSICAL EXAM:  Physical Exam: General:  Well appearing. No resp difficulty HEENT: normal Neck: supple. JVP 5-6. Carotids 2+ bilat; carotid bruits L>R No lymphadenopathy or thryomegaly appreciated. Cor: PMI nondisplaced. Regular rate & rhythm. 2/6 SEM RUSB  Lungs: clear Abdomen: soft, nontender, nondistended. No hepatosplenomegaly. No bruits or masses. Good bowel sounds. Extremities: no cyanosis, clubbing, rash, tr edema Neuro: alert & orientedx3, cranial nerves grossly intact. moves all 4 extremities w/o difficulty. Affect pleasant  ASSESSMENT/Plan 1. Chronic systolic HF: Ischemic cardiomyopathy.  ECHO 11/26/12 EF 35-40%.  S/P ICD. NYHA class II symptoms currently. Volume status stable.  - Continue lasix as needed.  - On goal carvedilol 25 mg twice a day - Continue hydralazine 50 mg twice a day/IMDUR 30 mg twice a day (decreased from 75 mg tid).  - Not on ACEI due to CKD.  May be able to start low dose in future.   2. CAD: s/p CABG.  No chest pain.  I will have him  restart Plavix and he can decrease ASA to 81 mg daily.  He cannot tolerate statins.  He was supposed to start Zetia but never did and does not want to. 3. CKD: Most recent creatinine was stable at 1.4.  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 4. HTN: Current BP control reasonable, says that  SBP < 130 at home.  5. H/o Type I aortic dissection: Follows with CVTS.   Loralie Champagne 03/16/2014

## 2014-03-19 ENCOUNTER — Encounter: Payer: Self-pay | Admitting: Family Medicine

## 2014-03-19 ENCOUNTER — Ambulatory Visit (INDEPENDENT_AMBULATORY_CARE_PROVIDER_SITE_OTHER): Payer: Medicaid Other | Admitting: Family Medicine

## 2014-03-19 VITALS — BP 115/71 | HR 71 | Ht 71.0 in | Wt 180.0 lb

## 2014-03-19 DIAGNOSIS — M545 Low back pain, unspecified: Secondary | ICD-10-CM

## 2014-03-19 DIAGNOSIS — T8062XA Other serum reaction due to vaccination, initial encounter: Secondary | ICD-10-CM

## 2014-03-19 DIAGNOSIS — Z1211 Encounter for screening for malignant neoplasm of colon: Secondary | ICD-10-CM

## 2014-03-19 DIAGNOSIS — Z0000 Encounter for general adult medical examination without abnormal findings: Secondary | ICD-10-CM | POA: Insufficient documentation

## 2014-03-19 DIAGNOSIS — T50A95A Adverse effect of other bacterial vaccines, initial encounter: Secondary | ICD-10-CM | POA: Insufficient documentation

## 2014-03-19 DIAGNOSIS — Z23 Encounter for immunization: Secondary | ICD-10-CM

## 2014-03-19 NOTE — Progress Notes (Signed)
Patient ID: Chad [name]    DOB: 1961/01/29, 53 y.o.   MRN: [id number] --- Subjective:  Chad [name] is a 53 y.o.male with significant cardiac history including CAD s/p CABG, type 1 aortic dissection, chronic systolic heart failure with EF30-35% and AICD who presents for request for GI referral for screening colonoscopy. He is also here to receive a Tdap shot at the recommendation of his home health nurse.  - colonoscopy: no family history of colon cancer. Interestingly, he states that he was diagnosed with colon cancer at the age of 54 from polyp biopsy and had exploratory surgery which did not show any evidence of disease.  He has occasional constipation from chronic oxycodone use for which he takes senna. He denies any blood in the stool.   - he also reports having some mild, intermittent left mid back pain that started a couple of weeks ago. He is concerned that it may be coming from his kidney. He denies any nausea, vomiting, dysuria, decreased or increased urine output. He denies any worsening with movement.   ROS: see HPI Past Medical History: reviewed and updated medications and allergies. Social History: Tobacco: none  Objective: Filed Vitals:   03/19/14 1600  BP: 115/71  Pulse: 71    Physical Examination:   General appearance - alert, frail appearing, but in no acute distress Chest - clear to auscultation, no wheezes, rales or rhonchi, symmetric air entry Heart - normal rate, regular rhythm, normal S1, S2, 2/6 systolic ejection murmur best heard at right sternal border Abdomen - soft, mild area of tenderness along right upper quadrant, but no rebound, no distention, no guarding.   Back - no tenderness to palpation along spine or paraspinal muscles.   Patient received Tdap injection in right deltoid and experienced local swelling.  On exam, 2.5x1cm oval area of soft tissue swelling, non tender, non erythematous Lung exam was normal with no wheezing or labored breathing Oropharynx  was clear without lip or tongue swelling  Patient became nauseous and rested for a few minutes with improvement of symptoms.   Liam Graham, PGY-3 Family Medicine Resident

## 2014-03-19 NOTE — Patient Instructions (Signed)
It was very nice to meet you. We will get you scheduled for your colonoscopy.

## 2014-03-19 NOTE — Assessment & Plan Note (Signed)
Pain he is experiencing is likely musculoskeletal in nature but recommended that he seek medical attention if worsening pain, nausea, vomiting, fevers or chills.

## 2014-03-19 NOTE — Assessment & Plan Note (Signed)
Experienced local reaction a few seconds after administration of vaccine.  No systemic symptoms apart from nausea which resolved with rest. No respiratory or airway compromise.  Swelling remained local and equal in size.  Precautions given for return to care.

## 2014-03-19 NOTE — Assessment & Plan Note (Signed)
-   GI referral for screening colonoscopy placed - tdap and pneumovax given today. Pneumovax given history of chronic heart disease

## 2014-03-31 ENCOUNTER — Encounter: Payer: Self-pay | Admitting: Gastroenterology

## 2014-04-07 ENCOUNTER — Encounter: Payer: Self-pay | Admitting: Internal Medicine

## 2014-04-07 ENCOUNTER — Ambulatory Visit (INDEPENDENT_AMBULATORY_CARE_PROVIDER_SITE_OTHER): Payer: Medicaid Other | Admitting: Internal Medicine

## 2014-04-07 VITALS — BP 132/84 | HR 85 | Ht 70.5 in | Wt 180.0 lb

## 2014-04-07 DIAGNOSIS — Z95810 Presence of automatic (implantable) cardiac defibrillator: Secondary | ICD-10-CM

## 2014-04-07 DIAGNOSIS — I5022 Chronic systolic (congestive) heart failure: Secondary | ICD-10-CM

## 2014-04-07 LAB — MDC_IDC_ENUM_SESS_TYPE_INCLINIC
Date Time Interrogation Session: 20150414040000
HighPow Impedance: 37 Ohm
HighPow Impedance: 55 Ohm
Implantable Pulse Generator Serial Number: 124654
Lead Channel Impedance Value: 440 Ohm
Lead Channel Pacing Threshold Pulse Width: 0.4 ms
Lead Channel Setting Pacing Amplitude: 2.4 V
Lead Channel Setting Pacing Pulse Width: 0.4 ms
Lead Channel Setting Sensing Sensitivity: 0.5 mV
MDC IDC MSMT LEADCHNL RV PACING THRESHOLD AMPLITUDE: 0.6 V
MDC IDC MSMT LEADCHNL RV SENSING INTR AMPL: 21.6 mV
MDC IDC SET ZONE DETECTION INTERVAL: 286 ms
MDC IDC SET ZONE DETECTION INTERVAL: 324 ms
Zone Setting Detection Interval: 240 ms

## 2014-04-07 NOTE — Assessment & Plan Note (Signed)
His [place] [employer] ICD is working normally. Will recheck in several months. 

## 2014-04-07 NOTE — Progress Notes (Signed)
HPI Chad [name] returns today for followup. He is a 53 year old man with an ischemic cardiomyopathy, status post bypass surgery, with chronic systolic heart failure, ejection fraction 25%. The patient is status post ICD implantation.  He denies chest pain or syncope. He has class II heart failure symptoms. He remains active and denies chest pain, sob, or syncope. No edema. No ICD shock. Allergies  Allergen Reactions  . Lipitor [Atorvastatin Calcium] Anaphylaxis    Large doses  . Sulfonamide Derivatives Shortness Of Breath  . Iohexol      Desc: PT HAS ANAPHYLAXIS WITH CONTRAST MEDIA!, Onset Date: 46962952   Code: SOB, Desc: ok w/ 13hr prep//a.c., Onset Date: 84132440   . Latex Other (See Comments)    With long periods of exposure REACTION: Reaction not known  . Zocor [Simvastatin] Other (See Comments)    Muscle cramps     Current Outpatient Prescriptions  Medication Sig Dispense Refill  . amLODipine (NORVASC) 5 MG tablet Take 5 mg by mouth 2 (two) times daily.      [name] aspirin 81 MG tablet Take 1 tablet (81 mg total) by mouth daily.      [name] b complex vitamins tablet Take 1 tablet by mouth daily.      . carvedilol (COREG) 25 MG tablet Take 25 mg by mouth 2 (two) times daily with a meal.      . clopidogrel (PLAVIX) 75 MG tablet Take 1 tablet (75 mg total) by mouth daily.  30 tablet  6  . cyclobenzaprine (FLEXERIL) 10 MG tablet Take 10 mg by mouth 2 (two) times daily as needed for muscle spasms.       [name] docusate sodium (COLACE) 100 MG capsule Take 100 mg by mouth as needed for mild constipation.       . furosemide (LASIX) 40 MG tablet Take 40 mg by mouth daily as needed for fluid.      [name] gabapentin (NEURONTIN) 300 MG capsule Take 300 mg by mouth at bedtime.      . hydrALAZINE (APRESOLINE) 25 MG tablet Take 50 mg by mouth 3 (three) times daily.       . isosorbide mononitrate (IMDUR) 30 MG 24 hr tablet Take 30 mg by mouth 2 (two) times daily.      [name] loratadine (CLARITIN) 10 MG tablet Take 1  tablet (10 mg total) by mouth daily as needed for allergies.  30 tablet  11  . nitroGLYCERIN (NITROSTAT) 0.4 MG SL tablet Place 1 tablet (0.4 mg total) under the tongue every 5 (five) minutes as needed for chest pain.  25 tablet  3  . oxycodone (ROXICODONE) 30 MG immediate release tablet Take 30 mg by mouth every 8 (eight) hours as needed for pain.       . potassium chloride SA (K-DUR,KLOR-CON) 20 MEQ tablet Take 1 tablet (20 mEq total) by mouth daily as needed (for muscle pain).  60 tablet  3   No current facility-administered medications for this visit.     Past Medical History  Diagnosis Date  . CAD (coronary artery disease)     a. s/p CABG 2006;  b. DES to PDA 2011 (cath: Dx not seen, dRCA/PDA tx with DES; S-PDA occluded (culprit), S-Dx occluded, S-RI and OM ok, L-LAD ok  . HTN (hypertension)     severe  . Chronic systolic heart failure     a. 12/13 ECHO: EF 35-40%, sept, apical & posterobasal HK, LV mod dil & sys fx mod reduced, mild AI, MV  mild reg, TV mild reg  . 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 CRI (chronic renal insufficiency)   . Thyroid cancer     Hertle Cell  . HLD (hyperlipidemia)   . Anemia   . Gout   . AICD (automatic cardioverter/defibrillator) present   . Carotid stenosis     dopplers 2011: 0-39% bilat.  . Chest pain syndrome     ROS:   All systems reviewed and negative except as noted in the HPI.   Past Surgical History  Procedure Laterality Date  . Coronary artery bypass graft  06/21/2009    x2  . Cardiac defibrillator placement      [employer]  . Thyroidectomy, partial  06/20/11  . Lumbar disc surgery      x 2, most recent within 5-10 years  . Status post emergency repair of a type a ascending aortic dissection with a hemiarch reconstruction of the ascending aorta  using a 28-mm hemashield graft with redo sternotomy and revision of previous bypass grafts in june 2010.    . Median sternotomy,cabg x 7  06/29/2005      Family History  Problem Relation Age of Onset  . Coronary artery disease    . Hypertension Father   . Heart disease Father   . Early death Father   . COPD Father   . Hypertension Mother      History   Social History  . Marital Status: Divorced    Spouse Name: N/A    Number of Children: N/A  . Years of Education: N/A   Occupational History  . disabled    Social History Main Topics  . Smoking status: Never Smoker   . Smokeless tobacco: Never Used  . Alcohol Use: No  . Drug Use: No  . Sexual Activity: Yes   Other Topics Concern  . Not on file   Social History Narrative   Engaged and lives with fiancee and 4 kids.    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      BP 132/84  Pulse 85  Ht 5' 10.5" (1.791 m)  Wt 180 lb (81.647 kg)  BMI 25.45 kg/m2  Physical Exam:  Chronically ill appearing middle-aged man, NAD HEENT: Unremarkable Neck:  7 cm JVD, no thyromegally Lungs:  Clear except for rales in the bases bilaterally. No wheezes or rhonchi. No increased work of breathing. HEART:  Regular rate rhythm, an intermittant MR murmur appears present with premature beats, no rubs, no clicks, Abd:  soft, positive bowel sounds, no organomegally, no rebound, no guarding Ext:  2 plus pulses, no edema, no cyanosis, no clubbing Skin:  No rashes no nodules Neuro:  CN II through XII intact, motor grossly intact  DEVICE  Normal device function.  See PaceArt for details.  Assess/Plan:

## 2014-04-07 NOTE — Patient Instructions (Signed)

## 2014-04-07 NOTE — Assessment & Plan Note (Signed)
His chronic systolic heart failure is class 2A. He will continue his current meds.

## 2014-04-09 ENCOUNTER — Ambulatory Visit: Payer: Medicaid Other | Admitting: Gastroenterology

## 2014-04-16 ENCOUNTER — Encounter (HOSPITAL_COMMUNITY): Payer: Medicaid Other

## 2014-04-20 ENCOUNTER — Ambulatory Visit: Payer: Medicaid Other | Admitting: Gastroenterology

## 2014-05-06 ENCOUNTER — Encounter (HOSPITAL_COMMUNITY): Payer: Medicaid Other

## 2014-05-06 ENCOUNTER — Ambulatory Visit: Payer: Medicaid Other | Admitting: Gastroenterology

## 2014-05-11 ENCOUNTER — Telehealth (HOSPITAL_COMMUNITY): Payer: Self-pay | Admitting: Cardiology

## 2014-05-11 NOTE — Telephone Encounter (Signed)
Spoke w/pt he states he is not having chest discomfort, that he was just feeling "drained and strained" he is feeling ok now, pt was sch to see [place] on [facility] but will be out of town, appt resch for tomorrow at 9:40

## 2014-05-11 NOTE — Telephone Encounter (Signed)
Ann called with concerns after speaking with pt this morning. She states pt spoke of not feeling well, chest pains/discomfort states- he needs his device checked Lelon Frohlich advised pt to call CHF clinic for further advise as his appt may need to be moved up'     Please advise

## 2014-05-12 ENCOUNTER — Encounter (HOSPITAL_COMMUNITY): Payer: Self-pay

## 2014-05-12 ENCOUNTER — Ambulatory Visit (HOSPITAL_COMMUNITY)
Admission: RE | Admit: 2014-05-12 | Discharge: 2014-05-12 | Disposition: A | Discharge status: Home / Self Care | Payer: Medicaid Other | Source: Ambulatory Visit | Attending: Internal Medicine | Admitting: Internal Medicine

## 2014-05-12 VITALS — BP 132/70 | HR 68 | Wt 178.4 lb

## 2014-05-12 DIAGNOSIS — I5022 Chronic systolic (congestive) heart failure: Secondary | ICD-10-CM | POA: Insufficient documentation

## 2014-05-12 DIAGNOSIS — I2510 Atherosclerotic heart disease of native coronary artery without angina pectoris: Secondary | ICD-10-CM | POA: Insufficient documentation

## 2014-05-12 DIAGNOSIS — N189 Chronic kidney disease, unspecified: Secondary | ICD-10-CM | POA: Insufficient documentation

## 2014-05-12 DIAGNOSIS — I10 Essential (primary) hypertension: Secondary | ICD-10-CM | POA: Diagnosis not present

## 2014-05-12 DIAGNOSIS — I129 Hypertensive chronic kidney disease with stage 1 through stage 4 chronic kidney disease, or unspecified chronic kidney disease: Secondary | ICD-10-CM | POA: Insufficient documentation

## 2014-05-12 DIAGNOSIS — I509 Heart failure, unspecified: Secondary | ICD-10-CM | POA: Insufficient documentation

## 2014-05-12 DIAGNOSIS — Z951 Presence of aortocoronary bypass graft: Secondary | ICD-10-CM | POA: Insufficient documentation

## 2014-05-12 DIAGNOSIS — I5043 Acute on chronic combined systolic (congestive) and diastolic (congestive) heart failure: Secondary | ICD-10-CM

## 2014-05-12 DIAGNOSIS — E785 Hyperlipidemia, unspecified: Secondary | ICD-10-CM | POA: Insufficient documentation

## 2014-05-12 DIAGNOSIS — Z85850 Personal history of malignant neoplasm of thyroid: Secondary | ICD-10-CM | POA: Insufficient documentation

## 2014-05-12 DIAGNOSIS — I2589 Other forms of chronic ischemic heart disease: Secondary | ICD-10-CM | POA: Insufficient documentation

## 2014-05-12 DIAGNOSIS — I7100 Dissection of unspecified site of aorta: Secondary | ICD-10-CM | POA: Insufficient documentation

## 2014-05-12 DIAGNOSIS — Z79899 Other long term (current) drug therapy: Secondary | ICD-10-CM | POA: Insufficient documentation

## 2014-05-12 DIAGNOSIS — Z7902 Long term (current) use of antithrombotics/antiplatelets: Secondary | ICD-10-CM | POA: Insufficient documentation

## 2014-05-12 DIAGNOSIS — Z95810 Presence of automatic (implantable) cardiac defibrillator: Secondary | ICD-10-CM | POA: Insufficient documentation

## 2014-05-12 MED ORDER — EZETIMIBE 10 MG PO TABS
10.0000 mg | ORAL_TABLET | Freq: Every day | ORAL | Status: DC
Start: 2014-05-12 — End: 2014-06-25

## 2014-05-12 NOTE — Addendum Note (Signed)
Encounter addended by: Scarlette Calico, RN on: 05/12/2014 10:29 AM<BR>     Documentation filed: Visit Diagnoses, Patient Instructions Section, Orders

## 2014-05-12 NOTE — Progress Notes (Signed)
Patient ID: Chad [name], male   DOB: Apr 01, 1961, 53 y.o.   MRN: [id number]  History of Present Illness: Primary Cardiologist:  Dr. Demetrios Loll is a 53 y.o. male with history of severe HTN, coronary artery disease status post previous myocardial infarction and bypass surgery in 2006 with DES to native PDA in 2011.  He also has a history congestive heart failure secondary to ischemic cardiomyopathy with EF in 30-35% range.  He is s/p single chamber ICD.  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 He underwent emergent repair of proximal aorta and reimplantation of his CABG grafts.  Carotids u/s in 2011 0-39%.  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 Also with significant low back pain s/p 2 surgeries  ETT/Myoview in 9/12: Walked 8:46 on [place]. Moderate-sized fixed inferior perfusion defect consistent with prior MI. EF 31% with septal hypokinesis. There was also inferior hypokinesis. Similar to prior study.   11/26/12 ECHO EF 35-40%   He returns for follow up. Overall feeling pretty good. Good days and bad days. Denies SOB/PND/Orthopnea/CP. SOB if BP is low. He is intolerant statin due to myalgias. No shocks. Taking all medications. SBP at home < 110.     Labs 11/25/13 Cholesterol 186 TG 121 HDL 49  Labs 12/09/13 K 3.6 creatinine 1.3  Labs 3/15 K 4.1, creatinine 1.4, BNP 589, HCT 39.8   ROS: All pertinent positives and negatives as in HPI, otherwise negative.    Past Medical History  Diagnosis Date  . CAD (coronary artery disease)     a. s/p CABG 2006;  b. DES to PDA 2011 (cath: Dx not seen, dRCA/PDA tx with DES; S-PDA occluded (culprit), S-Dx occluded, S-RI and OM ok, L-LAD ok  . HTN (hypertension)     severe  . Chronic systolic heart failure     a. 12/13 ECHO: EF 35-40%, sept, apical & posterobasal HK, LV mod dil & sys fx mod reduced, mild AI, MV mild reg, TV mild reg  . Aortic dissection, thoracoabdominal      7/10: Type I s/p repair  . CRI (chronic renal insufficiency)   . Thyroid cancer     Hertle Cell  . HLD (hyperlipidemia)   . Anemia   . Gout   . AICD (automatic cardioverter/defibrillator) present   . Carotid stenosis     dopplers 2011: 0-39% bilat.  . Chest pain syndrome     Current Outpatient Prescriptions  Medication Sig Dispense Refill  . amLODipine (NORVASC) 5 MG tablet Take 5 mg by mouth 2 (two) times daily.      [name] b complex vitamins tablet Take 1 tablet by mouth daily.      . carvedilol (COREG) 25 MG tablet Take 25 mg by mouth 2 (two) times daily with a meal.      . clopidogrel (PLAVIX) 75 MG tablet Take 1 tablet (75 mg total) by mouth daily.  30 tablet  6  . cyclobenzaprine (FLEXERIL) 10 MG tablet Take 10 mg by mouth 2 (two) times daily as needed for muscle spasms.       [name] docusate sodium (COLACE) 100 MG capsule Take 100 mg by mouth as needed for mild constipation.       . furosemide (LASIX) 40 MG tablet Take 40 mg by mouth daily as needed for fluid.      [name] gabapentin (NEURONTIN) 300 MG capsule Take 300 mg by mouth at bedtime.      [name]  hydrALAZINE (APRESOLINE) 25 MG tablet Take 75 mg by mouth 3 (three) times daily.       . isosorbide mononitrate (IMDUR) 30 MG 24 hr tablet Take 30 mg by mouth 2 (two) times daily.      [name] loratadine (CLARITIN) 10 MG tablet Take 1 tablet (10 mg total) by mouth daily as needed for allergies.  30 tablet  11  . nitroGLYCERIN (NITROSTAT) 0.4 MG SL tablet Place 1 tablet (0.4 mg total) under the tongue every 5 (five) minutes as needed for chest pain.  25 tablet  3  . oxycodone (ROXICODONE) 30 MG immediate release tablet Take 30 mg by mouth every 8 (eight) hours as needed for pain.       . potassium chloride SA (K-DUR,KLOR-CON) 20 MEQ tablet Take 1 tablet (20 mEq total) by mouth daily as needed (for muscle pain).  60 tablet  3   No current facility-administered medications for this encounter.    Allergies  Allergen Reactions  . Lipitor [Atorvastatin  Calcium] Anaphylaxis    Large doses  . Sulfonamide Derivatives Shortness Of Breath  . Iohexol      Desc: PT HAS ANAPHYLAXIS WITH CONTRAST MEDIA!, Onset Date: 30160109   Code: SOB, Desc: ok w/ 13hr prep//a.c., Onset Date: 32355732   . Latex Other (See Comments)    With long periods of exposure REACTION: Reaction not known  . Zocor [Simvastatin] Other (See Comments)    Muscle cramps    Vital Signs: Filed Vitals:   05/12/14 0935  BP: 132/70  Pulse: 68  Weight: 178 lb 6.4 oz (80.922 kg)  SpO2: 98%    Physical Exam: General:  Well appearing. No resp difficulty HEENT: normal Neck: supple. JVP 5-6. Carotids 2+ bilat; carotid bruits L>R No lymphadenopathy or thryomegaly appreciated. Cor: PMI nondisplaced. Regular rate & rhythm. 2/6 SEM RUSB  Lungs: clear Abdomen: soft, nontender, nondistended. No hepatosplenomegaly. No bruits or masses. Good bowel sounds. Extremities: no cyanosis, clubbing, rash, tr edema Neuro: alert & orientedx3, cranial nerves grossly intact. moves all 4 extremities w/o difficulty. Affect pleasant  ASSESSMENT/Plan 1. Chronic systolic HF: Ischemic cardiomyopathy.  ECHO 11/26/12 EF 35-40%.  S/P ICD. NYHA class II symptoms currently. Volume status stable.  Continue lasix as needed.  - On goal carvedilol 25 mg twice a day - Continue hydralazine 50 mg twice a day/IMDUR 30 mg twice a day   - Not on ACEI due to CKD.  BP currently to low to try.  2. CAD: s/p CABG.  No chest pain.  Continue Plavix and ASA to 81 .  He cannot tolerate statins due myalgias - we have tried even low-dose and he has not tolerated.   3. CKD: Most recent creatinine was stable at 1.4.  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 4. HTN: Current BP control reasonable, says that SBP < 130 at home.  5. H/o Type I aortic dissection: Follows with CVTS.   Follow up in 6 months.   Amy D Clegg NP-C  05/12/2014  Patient seen and examined with Darrick Grinder, NP. We discussed all aspects of the encounter. I  agree with the assessment and plan as stated above.  Doing very well. Stable on current regimen. Will see back in 6 months with ECHO. Will recheck lipids and try Zetia.   Shaune Pascal Riggs Dineen,MD 10:17 AM

## 2014-05-12 NOTE — Patient Instructions (Addendum)
Your physician recommends that you return for a FASTING lipid profile: 1 week  Start Zetia 10 mg daily  Follow up in 6 months  Do the following things EVERYDAY: 1) Weigh yourself in the morning before breakfast. Write it down and keep it in a log. 2) Take your medicines as prescribed 3) Eat low salt foods-Limit salt (sodium) to 2000 mg per day.  4) Stay as active as you can everyday 5) Limit all fluids for the day to less than 2 liters

## 2014-05-14 ENCOUNTER — Encounter (HOSPITAL_COMMUNITY): Payer: Medicaid Other

## 2014-06-11 ENCOUNTER — Encounter (HOSPITAL_COMMUNITY): Payer: Self-pay | Admitting: Emergency Medicine

## 2014-06-11 ENCOUNTER — Emergency Department (HOSPITAL_COMMUNITY)
Admission: EM | Admit: 2014-06-11 | Discharge: 2014-06-11 | Discharge status: Against Medical Advice | Payer: Medicaid Other | Attending: Emergency Medicine | Admitting: Emergency Medicine

## 2014-06-11 ENCOUNTER — Other Ambulatory Visit (HOSPITAL_COMMUNITY): Payer: Self-pay | Admitting: Internal Medicine

## 2014-06-11 DIAGNOSIS — Z862 Personal history of diseases of the blood and blood-forming organs and certain disorders involving the immune mechanism: Secondary | ICD-10-CM | POA: Insufficient documentation

## 2014-06-11 DIAGNOSIS — Z7902 Long term (current) use of antithrombotics/antiplatelets: Secondary | ICD-10-CM | POA: Insufficient documentation

## 2014-06-11 DIAGNOSIS — Z9889 Other specified postprocedural states: Secondary | ICD-10-CM | POA: Insufficient documentation

## 2014-06-11 DIAGNOSIS — Z8669 Personal history of other diseases of the nervous system and sense organs: Secondary | ICD-10-CM | POA: Insufficient documentation

## 2014-06-11 DIAGNOSIS — N189 Chronic kidney disease, unspecified: Secondary | ICD-10-CM | POA: Insufficient documentation

## 2014-06-11 DIAGNOSIS — Z91040 Latex allergy status: Secondary | ICD-10-CM | POA: Insufficient documentation

## 2014-06-11 DIAGNOSIS — M545 Low back pain, unspecified: Secondary | ICD-10-CM | POA: Insufficient documentation

## 2014-06-11 DIAGNOSIS — R011 Cardiac murmur, unspecified: Secondary | ICD-10-CM | POA: Insufficient documentation

## 2014-06-11 DIAGNOSIS — Z85850 Personal history of malignant neoplasm of thyroid: Secondary | ICD-10-CM | POA: Insufficient documentation

## 2014-06-11 DIAGNOSIS — I5022 Chronic systolic (congestive) heart failure: Secondary | ICD-10-CM | POA: Insufficient documentation

## 2014-06-11 DIAGNOSIS — I129 Hypertensive chronic kidney disease with stage 1 through stage 4 chronic kidney disease, or unspecified chronic kidney disease: Secondary | ICD-10-CM | POA: Insufficient documentation

## 2014-06-11 DIAGNOSIS — M549 Dorsalgia, unspecified: Secondary | ICD-10-CM

## 2014-06-11 DIAGNOSIS — Z951 Presence of aortocoronary bypass graft: Secondary | ICD-10-CM | POA: Insufficient documentation

## 2014-06-11 DIAGNOSIS — E785 Hyperlipidemia, unspecified: Secondary | ICD-10-CM | POA: Insufficient documentation

## 2014-06-11 DIAGNOSIS — Z79899 Other long term (current) drug therapy: Secondary | ICD-10-CM | POA: Insufficient documentation

## 2014-06-11 DIAGNOSIS — I2510 Atherosclerotic heart disease of native coronary artery without angina pectoris: Secondary | ICD-10-CM | POA: Insufficient documentation

## 2014-06-11 DIAGNOSIS — Z95810 Presence of automatic (implantable) cardiac defibrillator: Secondary | ICD-10-CM | POA: Insufficient documentation

## 2014-06-11 NOTE — ED Provider Notes (Signed)
Medical screening examination/treatment/procedure(s) were performed by non-physician practitioner and as supervising physician I was immediately available for consultation/collaboration.   EKG Interpretation   Date/Time:  Thursday June 11 2014 06:03:12 EDT Ventricular Rate:  65 PR Interval:  202 QRS Duration: 112 QT Interval:  421 QTC Calculation: 438 R Axis:   -28 Text Interpretation:  Sinus rhythm Borderline prolonged PR interval Left  atrial enlargement LVH with secondary repolarization abnormality No  significant change since last tracing Confirmed by YAO  MD, DAVID ([id number])  on 06/11/2014 6:26:59 AM        Wandra Arthurs, MD 06/11/14 2256

## 2014-06-11 NOTE — Discharge Instructions (Signed)
Back Pain, Adult °Low back pain is very common. About 1 in 5 people have back pain. The cause of low back pain is rarely dangerous. The pain often gets better over time. About half of people with a sudden onset of back pain feel better in just 2 weeks. About 8 in 10 people feel better by 6 weeks.  °CAUSES °Some common causes of back pain include: °· Strain of the muscles or ligaments supporting the spine. °· Wear and tear (degeneration) of the spinal discs. °· Arthritis. °· Direct injury to the back. °DIAGNOSIS °Most of the time, the direct cause of low back pain is not known. However, back pain can be treated effectively even when the exact cause of the pain is unknown. Answering your caregiver's questions about your overall health and symptoms is one of the most accurate ways to make sure the cause of your pain is not dangerous. If your caregiver needs more information, he or she may order lab work or imaging tests (X-rays or MRIs). However, even if imaging tests show changes in your back, this usually does not require surgery. °HOME CARE INSTRUCTIONS °For many people, back pain returns. Since low back pain is rarely dangerous, it is often a condition that people can learn to manage on their own.  °· Remain active. It is stressful on the back to sit or stand in one place. Do not sit, drive, or stand in one place for more than 30 minutes at a time. Take short walks on level surfaces as soon as pain allows. Try to increase the length of time you walk each day. °· Do not stay in bed. Resting more than 1 or 2 days can delay your recovery. °· Do not avoid exercise or work. Your body is made to move. It is not dangerous to be active, even though your back may hurt. Your back will likely heal faster if you return to being active before your pain is gone. °· Pay attention to your body when you  bend and lift. Many people have less discomfort when lifting if they bend their knees, keep the load close to their bodies, and  avoid twisting. Often, the most comfortable positions are those that put less stress on your recovering back. °· Find a comfortable position to sleep. Use a firm mattress and lie on your side with your knees slightly bent. If you lie on your back, put a pillow under your knees. °· Only take over-the-counter or prescription medicines as directed by your caregiver. Over-the-counter medicines to reduce pain and inflammation are often the most helpful. Your caregiver may prescribe muscle relaxant drugs. These medicines help dull your pain so you can more quickly return to your normal activities and healthy exercise. °· Put ice on the injured area. °¨ Put ice in a plastic bag. °¨ Place a towel between your skin and the bag. °¨ Leave the ice on for 15-20 minutes, 03-04 times a day for the first 2 to 3 days. After that, ice and heat may be alternated to reduce pain and spasms. °· Ask your caregiver about trying back exercises and gentle massage. This may be of some benefit. °· Avoid feeling anxious or stressed. Stress increases muscle tension and can worsen back pain. It is important to recognize when you are anxious or stressed and learn ways to manage it. Exercise is a great option. °SEEK MEDICAL CARE IF: °· You have pain that is not relieved with rest or medicine. °· You have pain that does not improve in 1 week. °· You have new symptoms. °· You are generally not feeling well. °SEEK   IMMEDIATE MEDICAL CARE IF:   You have pain that radiates from your back into your legs.  You develop new bowel or bladder control problems.  You have unusual weakness or numbness in your arms or legs.  You develop nausea or vomiting.  You develop abdominal pain.  You feel faint. Document Released: 12/11/2005 Document Revised: 06/11/2012 Document Reviewed: 05/01/2011 [facility] Patient Information 2015 [place], [place]. This information is not intended to replace advice given to you by your health care provider. Make sure you  discuss any questions you have with your health care provider.  Flank Pain Flank pain refers to pain that is located on the side of the body between the upper abdomen and the back. The pain may occur over a short period of time (acute) or may be long-term or reoccurring (chronic). It may be mild or severe. Flank pain can be caused by many things. CAUSES  Some of the more common causes of flank pain include:  Muscle strains.   Muscle spasms.   A disease of your spine (vertebral disk disease).   A lung infection (pneumonia).   Fluid around your lungs (pulmonary edema).   A kidney infection.   Kidney stones.   A very painful skin rash caused by the chickenpox virus (shingles).   Gallbladder disease.  [place] care will depend on the cause of your pain. In general,  Rest as directed by your caregiver.  Drink enough fluids to keep your urine clear or pale yellow.  Only take over-the-counter or prescription medicines as directed by your caregiver. Some medicines may help relieve the pain.  Tell your caregiver about any changes in your pain.  Follow up with your caregiver as directed. SEEK IMMEDIATE MEDICAL CARE IF:   Your pain is not controlled with medicine.   You have new or worsening symptoms.  Your pain increases.   You have abdominal pain.   You have shortness of breath.   You have persistent nausea or vomiting.   You have swelling in your abdomen.   You feel faint or pass out.   You have blood in your urine.  You have a fever or persistent symptoms for more than 2-3 days.  You have a fever and your symptoms suddenly get worse. MAKE SURE YOU:   Understand these instructions.  Will watch your condition.  Will get help right away if you are not doing well or get worse. Document Released: 02/01/2006 Document Revised: 09/04/2012 Document Reviewed: 07/25/2012 [facility] Patient Information 2015 [place], [place]. This  information is not intended to replace advice given to you by your health care provider. Make sure you discuss any questions you have with your health care provider.

## 2014-06-11 NOTE — ED Notes (Addendum)
Bilat flank pain along with nausea woke pt from sleep approx 2.5 hrs ago.  Reports pain was 10/10, now 0/10.  Has extensive heart hx.  Denies cardiac or associated issues. Took oxycodone prior to coming to ED, now making him sleepy and he is pain free.

## 2014-06-11 NOTE — ED Provider Notes (Signed)
CSN: [id number]     Arrival date & time 06/11/14  0603 History   None    Chief Complaint  Patient presents with  . Flank Pain   HPI  Chad [name] is a 52 y.o. male with a PMH of CAD s/p CABG, chronic systolic HF, HTN, aortic dissection s/p repair, AICD, carotid stenosis, chronic renal insufficiency, hyperlipidemia, thyroid cancer, and gout who presents to the ED for evaluation of flank pain. History was provided by the patient. Patient states that about 2.5 hours PTA he was awoken from his sleep by sudden lower back pain (R=L). He cannot describe the pain but states "it hurt." His pain lasted a few hours but has since resolved. He states he took his oxycodone and BP medications which resolved his pain. Movement made his pain worse. He has a hx of lower back pain in the past s/p lumbar disc surgery x 2, which was not similar to his pain today. Associated symptoms included nausea. He denise any weakness, loss of sensation, dysuria, difficulty with urination, numbness/tingling, chest pain, SOB, abdominal pain, emesis, loss of bowel/bladder function, headache, dizziness or lightheadedness. He otherwise has been feeling well with no recent fevers, chills, change in appetite/activity, or other concerns. He states he feels much better now and wants to leave. No recent trauma or injuries. Feels tired from the oxycodone, otherwise has no complaints.     Past Medical History  Diagnosis Date  . CAD (coronary artery disease)     a. s/p CABG 2006;  b. DES to PDA 2011 (cath: Dx not seen, dRCA/PDA tx with DES; S-PDA occluded (culprit), S-Dx occluded, S-RI and OM ok, L-LAD ok  . HTN (hypertension)     severe  . Chronic systolic heart failure     a. 12/13 ECHO: EF 35-40%, sept, apical & posterobasal HK, LV mod dil & sys fx mod reduced, mild AI, MV mild reg, TV mild reg  . 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 CRI (chronic renal insufficiency)   . Thyroid cancer     Hertle Cell   . HLD (hyperlipidemia)   . Anemia   . Gout   . AICD (automatic cardioverter/defibrillator) present   . Carotid stenosis     dopplers 2011: 0-39% bilat.  . Chest pain syndrome    Past Surgical History  Procedure Laterality Date  . Coronary artery bypass graft  06/21/2009    x2  . Cardiac defibrillator placement      [employer]  . Thyroidectomy, partial  06/20/11  . Lumbar disc surgery      x 2, most recent within 5-10 years  . Status post emergency repair of a type a ascending aortic dissection with a hemiarch reconstruction of the ascending aorta  using a 28-mm hemashield graft with redo sternotomy and revision of previous bypass grafts in june 2010.    . Median sternotomy,cabg x 7  06/29/2005   Family History  Problem Relation Age of Onset  . Coronary artery disease    . Hypertension Father   . Heart disease Father   . Early death Father   . COPD Father   . Hypertension Mother    History  Substance Use Topics  . Smoking status: Never Smoker   . Smokeless tobacco: Never Used  . Alcohol Use: No    Review of Systems  Constitutional: Positive for fatigue. Negative for fever, chills, activity change and appetite change.  HENT: Negative for congestion, rhinorrhea  and sore throat.   Respiratory: Negative for cough and shortness of breath.   Cardiovascular: Negative for chest pain and leg swelling.  Gastrointestinal: Positive for nausea. Negative for vomiting, abdominal pain, diarrhea and constipation.  Genitourinary: Negative for dysuria, urgency and difficulty urinating.  Musculoskeletal: Positive for back pain (resolved). Negative for arthralgias, gait problem, myalgias and neck pain.  Neurological: Negative for dizziness, weakness, light-headedness, numbness and headaches.    Allergies  Lipitor; Sulfonamide derivatives; Iohexol; Latex; and Zocor  Home Medications   Prior to Admission medications   Medication Sig Start Date End Date Taking? Authorizing  Provider  amLODipine (NORVASC) 5 MG tablet Take 5 mg by mouth 2 (two) times daily.    Historical Provider, MD  b complex vitamins tablet Take 1 tablet by mouth daily.    Historical Provider, MD  carvedilol (COREG) 25 MG tablet Take 25 mg by mouth 2 (two) times daily with a meal.    Historical Provider, MD  clopidogrel (PLAVIX) 75 MG tablet Take 1 tablet (75 mg total) by mouth daily. 03/16/14   Larey Dresser, MD  cyclobenzaprine (FLEXERIL) 10 MG tablet Take 10 mg by mouth 2 (two) times daily as needed for muscle spasms.     Historical Provider, MD  docusate sodium (COLACE) 100 MG capsule Take 100 mg by mouth as needed for mild constipation.     Historical Provider, MD  ezetimibe (ZETIA) 10 MG tablet Take 1 tablet (10 mg total) by mouth daily. 05/12/14   Jolaine Artist, MD  furosemide (LASIX) 40 MG tablet Take 40 mg by mouth daily as needed for fluid.    Historical Provider, MD  gabapentin (NEURONTIN) 300 MG capsule Take 300 mg by mouth at bedtime.    Historical Provider, MD  hydrALAZINE (APRESOLINE) 25 MG tablet Take 75 mg by mouth 3 (three) times daily.  07/22/13   Jolaine Artist, MD  isosorbide mononitrate (IMDUR) 30 MG 24 hr tablet Take 30 mg by mouth 2 (two) times daily.    Historical Provider, MD  loratadine (CLARITIN) 10 MG tablet Take 1 tablet (10 mg total) by mouth daily as needed for allergies. 07/29/13   Amy D Ninfa Meeker, NP  nitroGLYCERIN (NITROSTAT) 0.4 MG SL tablet Place 1 tablet (0.4 mg total) under the tongue every 5 (five) minutes as needed for chest pain. 07/22/13   Jolaine Artist, MD  oxycodone (ROXICODONE) 30 MG immediate release tablet Take 30 mg by mouth every 8 (eight) hours as needed for pain.     Historical Provider, MD  potassium chloride SA (K-DUR,KLOR-CON) 20 MEQ tablet Take 1 tablet (20 mEq total) by mouth daily as needed (for muscle pain). 03/16/14   Larey Dresser, MD   BP 121/69  Temp(Src) 98 F (36.7 C) (Oral)  Resp 12  Ht 5\' 11"  (1.803 m)  Wt 173 lb (78.472  kg)  BMI 24.14 kg/m2  SpO2 98%  Filed Vitals:   06/11/14 0605 06/11/14 0615  BP: 121/69 117/76  Pulse:  70  Temp: 98 F (36.7 C)   TempSrc: Oral   Resp: 12 14  Height: 5\' 11"  (1.803 m)   Weight: 173 lb (78.472 kg)   SpO2: 98% 100%    Physical Exam  Nursing note and vitals reviewed. Constitutional: He is oriented to person, place, and time. He appears well-developed and well-nourished. No distress.  HENT:  Head: Normocephalic and atraumatic.  Right Ear: External ear normal.  Left Ear: External ear normal.  Nose: Nose normal.  Mouth/Throat:  Oropharynx is clear and moist. No oropharyngeal exudate.  Eyes: Conjunctivae are normal. Pupils are equal, round, and reactive to light. Right eye exhibits no discharge. Left eye exhibits no discharge.  Neck: Normal range of motion. Neck supple.  Cardiovascular: Normal rate and regular rhythm.  Exam reveals no gallop and no friction rub.   Murmur heard. Grade 1 holosystolic murmur. Dorsalis pedis pulses present and equal bilaterally  Pulmonary/Chest: Effort normal and breath sounds normal. No respiratory distress. He has no wheezes. He has no rales. He exhibits no tenderness.  Abdominal: Soft. He exhibits no distension and no mass. There is no tenderness. There is no rebound and no guarding.  Musculoskeletal: Normal range of motion. He exhibits no edema and no tenderness.  No CVA, lumbar, or flank tenderness bilaterally. No tenderness to palpation to the thoracic or lumbar spinous processes throughout. No tenderness to palpation to the paraspinal muscles throughout. Strength 5/5 in the upper and lower extremities bilaterally. Patient able to ambulate without difficulty or ataxia  Neurological: He is alert and oriented to person, place, and time.  GCS 15. No focal neurological deficits. CN 2-12 intact. Patellar reflexes intact. Gross sensation intact in the LE bilaterally.   Skin: Skin is warm and dry. He is not diaphoretic.    ED Course   Procedures (including critical care time) Labs Review Labs Reviewed - No data to display  Imaging Review No results found.   EKG Interpretation   Date/Time:  Thursday June 11 2014 06:03:12 EDT Ventricular Rate:  65 PR Interval:  202 QRS Duration: 112 QT Interval:  421 QTC Calculation: 438 R Axis:   -28 Text Interpretation:  Sinus rhythm Borderline prolonged PR interval Left  atrial enlargement LVH with secondary repolarization abnormality No  significant change since last tracing Confirmed by YAO  MD, DAVID ([id number])  on 06/11/2014 6:26:59 AM      MDM   Supreme Edmon Crape is a 53 y.o. male  with a PMH of CAD s/p CABG, chronic systolic HF, HTN, aortic dissection s/p repair, AICD, carotid stenosis, chronic renal insufficiency, hyperlipidemia, thyroid cancer, and gout who presents to the ED for evaluation of back pain. Patient states pain has resolved and wants to leave AMA. Spoke with patient regarding staying evaluation but he stated "I don't want to have this conversation anymore" and "give me my papers" and "I will make an appointment." Spoke with patient about risks of leaving AMA and her verbally acknowledged this including risk of death and disability. Etiology of back pain unclear. Possibly chronic, however other causes cannot be excluded at this time. Patient neurovascularly intact with no focal neurological deficits. Patient afebrile and non-toxic in appearance. Vital signs stable. Instructed to return to the ED and or follow-up with his PCP for continued care. Return precautions, discharge instructions, and follow-up was discussed with the patient before discharge.      Discharge Medication List as of 06/11/2014  6:36 AM      Final impressions: 1. Back pain       Denman George            [place], [place] 06/11/14 [phone]

## 2014-06-11 NOTE — ED Notes (Signed)
Pt not in room when I went in to discharge him.

## 2014-06-12 ENCOUNTER — Other Ambulatory Visit (HOSPITAL_COMMUNITY): Payer: Self-pay

## 2014-06-12 MED ORDER — ISOSORBIDE MONONITRATE ER 30 MG PO TB24: 30.0000 mg | ORAL_TABLET | Freq: Two times a day (BID) | ORAL | Status: DC

## 2014-06-25 ENCOUNTER — Other Ambulatory Visit (HOSPITAL_COMMUNITY): Payer: Self-pay | Admitting: *Deleted

## 2014-06-25 ENCOUNTER — Other Ambulatory Visit (HOSPITAL_COMMUNITY): Payer: Self-pay

## 2014-06-25 MED ORDER — CARVEDILOL 25 MG PO TABS: 25.0000 mg | ORAL_TABLET | Freq: Two times a day (BID) | ORAL | Status: DC

## 2014-06-25 MED ORDER — FUROSEMIDE 40 MG PO TABS: 40.0000 mg | ORAL_TABLET | Freq: Every day | ORAL | Status: DC | PRN

## 2014-06-25 MED ORDER — ISOSORBIDE MONONITRATE ER 30 MG PO TB24: ORAL_TABLET | ORAL | Status: DC

## 2014-06-25 MED ORDER — POTASSIUM CHLORIDE CRYS ER 20 MEQ PO TBCR: 20.0000 meq | EXTENDED_RELEASE_TABLET | Freq: Every day | ORAL | Status: DC | PRN

## 2014-06-25 MED ORDER — AMLODIPINE BESYLATE 5 MG PO TABS: 5.0000 mg | ORAL_TABLET | Freq: Two times a day (BID) | ORAL | Status: DC

## 2014-06-25 MED ORDER — EZETIMIBE 10 MG PO TABS: 10.0000 mg | ORAL_TABLET | Freq: Every day | ORAL | Status: DC

## 2014-06-25 MED ORDER — HYDRALAZINE HCL 25 MG PO TABS: 75.0000 mg | ORAL_TABLET | Freq: Three times a day (TID) | ORAL | Status: DC

## 2014-06-25 MED ORDER — CLOPIDOGREL BISULFATE 75 MG PO TABS: 75.0000 mg | ORAL_TABLET | Freq: Every day | ORAL | Status: DC

## 2014-07-03 ENCOUNTER — Telehealth (HOSPITAL_COMMUNITY): Payer: Self-pay | Admitting: Vascular Surgery

## 2014-07-03 NOTE — Telephone Encounter (Signed)
Orders faxed

## 2014-07-03 NOTE — Telephone Encounter (Signed)
Heather... [name] call about Tele monitor orders for the pt. Fax [phone]

## 2014-07-06 ENCOUNTER — Telehealth (HOSPITAL_COMMUNITY): Payer: Self-pay | Admitting: Vascular Surgery

## 2014-07-06 NOTE — Telephone Encounter (Signed)
[name] nurse wd/ [facility] [employer] [facility] called about Tele- monitor order for the pt. Please advise. [phone] ext [phone] or [phone]

## 2014-07-06 NOTE — Telephone Encounter (Signed)
Left message to call back  

## 2014-07-07 NOTE — Telephone Encounter (Signed)
Spoke w/Chad [name] she states she did not receive orders faxed on Fri 7/10, orders will be refaxed

## 2014-07-07 NOTE — Telephone Encounter (Signed)
Order faxed to [facility] at [phone] with confirmation, spoke w/Anne she states she still has not received and she will come by office to pick up copy

## 2014-07-09 ENCOUNTER — Telehealth: Payer: Self-pay | Admitting: Cardiology

## 2014-07-09 ENCOUNTER — Encounter: Payer: Medicaid Other | Admitting: *Deleted

## 2014-07-09 NOTE — Telephone Encounter (Signed)
LMOVM reminding pt to send remote transmission.   

## 2014-07-10 ENCOUNTER — Encounter: Payer: Self-pay | Admitting: Cardiology

## 2014-07-13 ENCOUNTER — Encounter (HOSPITAL_COMMUNITY): Payer: Self-pay | Admitting: *Deleted

## 2014-07-13 ENCOUNTER — Encounter (HOSPITAL_COMMUNITY): Payer: Self-pay

## 2014-07-13 ENCOUNTER — Ambulatory Visit (HOSPITAL_COMMUNITY)
Admission: RE | Admit: 2014-07-13 | Discharge: 2014-07-13 | Disposition: A | Discharge status: Home / Self Care | Payer: Medicaid Other | Source: Ambulatory Visit | Attending: Cardiology | Admitting: Cardiology

## 2014-07-13 VITALS — BP 110/80 | HR 85 | Wt 174.4 lb

## 2014-07-13 DIAGNOSIS — N189 Chronic kidney disease, unspecified: Secondary | ICD-10-CM | POA: Diagnosis not present

## 2014-07-13 DIAGNOSIS — M109 Gout, unspecified: Secondary | ICD-10-CM | POA: Insufficient documentation

## 2014-07-13 DIAGNOSIS — I129 Hypertensive chronic kidney disease with stage 1 through stage 4 chronic kidney disease, or unspecified chronic kidney disease: Secondary | ICD-10-CM | POA: Insufficient documentation

## 2014-07-13 DIAGNOSIS — D649 Anemia, unspecified: Secondary | ICD-10-CM | POA: Diagnosis not present

## 2014-07-13 DIAGNOSIS — I2589 Other forms of chronic ischemic heart disease: Secondary | ICD-10-CM | POA: Insufficient documentation

## 2014-07-13 DIAGNOSIS — I7103 Dissection of thoracoabdominal aorta: Secondary | ICD-10-CM | POA: Diagnosis not present

## 2014-07-13 DIAGNOSIS — Z95810 Presence of automatic (implantable) cardiac defibrillator: Secondary | ICD-10-CM | POA: Diagnosis not present

## 2014-07-13 DIAGNOSIS — I25810 Atherosclerosis of coronary artery bypass graft(s) without angina pectoris: Secondary | ICD-10-CM | POA: Diagnosis not present

## 2014-07-13 DIAGNOSIS — I5022 Chronic systolic (congestive) heart failure: Secondary | ICD-10-CM | POA: Diagnosis not present

## 2014-07-13 DIAGNOSIS — I252 Old myocardial infarction: Secondary | ICD-10-CM | POA: Diagnosis not present

## 2014-07-13 DIAGNOSIS — E785 Hyperlipidemia, unspecified: Secondary | ICD-10-CM | POA: Insufficient documentation

## 2014-07-13 DIAGNOSIS — Z951 Presence of aortocoronary bypass graft: Secondary | ICD-10-CM | POA: Diagnosis not present

## 2014-07-13 DIAGNOSIS — Z905 Acquired absence of kidney: Secondary | ICD-10-CM | POA: Insufficient documentation

## 2014-07-13 DIAGNOSIS — I2510 Atherosclerotic heart disease of native coronary artery without angina pectoris: Secondary | ICD-10-CM | POA: Insufficient documentation

## 2014-07-13 DIAGNOSIS — I509 Heart failure, unspecified: Secondary | ICD-10-CM | POA: Diagnosis not present

## 2014-07-13 NOTE — Patient Instructions (Signed)
Doing great.  Follow up in 6 months with ECHO  Do the following things EVERYDAY: 1) Weigh yourself in the morning before breakfast. Write it down and keep it in a log. 2) Take your medicines as prescribed 3) Eat low salt foods-Limit salt (sodium) to 2000 mg per day.  4) Stay as active as you can everyday 5) Limit all fluids for the day to less than 2 liters 6)

## 2014-07-13 NOTE — Progress Notes (Signed)
Patient ID: Chad [name], male   DOB: [birth year], 53 y.o.   MRN: [id number]  History of Present Illness: Primary Cardiologist:  Dr. Demetrios Loll is a 53 y.o. male with history of severe HTN, coronary artery disease status post previous myocardial infarction and bypass surgery in 2006 with DES to native PDA in 2011.  He also has a history congestive heart failure secondary to ischemic cardiomyopathy   He is s/p single chamber ICD.  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 He underwent emergent repair of proximal aorta and reimplantation of his CABG grafts.  Carotids u/s in 2011 0-39%.  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 Also with significant low back pain s/p 2 surgeries  ETT/Myoview in 9/12: Walked 8:46 on [place]. Moderate-sized fixed inferior perfusion defect consistent with prior MI. EF 31% with septal hypokinesis. There was also inferior hypokinesis. Similar to prior study.   11/26/12 ECHO EF 35-40%   Follow up for Heart Failure: Overall feels great. Still having a lot of back pain. Denies SOB, orthopnea, PND or CP. SBP at home 100-120s.He is intolerant statin due to myalgias. No shocks. Taking all medications. Able to walk around the whole grocery store with no issues. Walked from [employer] here with no issues. Able to go upstairs.    Labs 11/25/13 Cholesterol 186 TG 121 HDL 49  Labs 12/09/13 K 3.6 creatinine 1.3  Labs 3/15 K 4.1, creatinine 1.4, BNP 589, HCT 39.8  ROS: All pertinent positives and negatives as in HPI, otherwise negative.    Past Medical History  Diagnosis Date  . CAD (coronary artery disease)     a. s/p CABG 2006;  b. DES to PDA 2011 (cath: Dx not seen, dRCA/PDA tx with DES; S-PDA occluded (culprit), S-Dx occluded, S-RI and OM ok, L-LAD ok  . HTN (hypertension)     severe  . Chronic systolic heart failure     a. 12/13 ECHO: EF 35-40%, sept, apical & posterobasal HK, LV mod dil &  sys fx mod reduced, mild AI, MV mild reg, TV mild reg  . 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 CRI (chronic renal insufficiency)   . Thyroid cancer     Hertle Cell  . HLD (hyperlipidemia)   . Anemia   . Gout   . AICD (automatic cardioverter/defibrillator) present   . Carotid stenosis     dopplers 2011: 0-39% bilat.  . Chest pain syndrome     Current Outpatient Prescriptions  Medication Sig Dispense Refill  . amLODipine (NORVASC) 5 MG tablet Take 1 tablet (5 mg total) by mouth 2 (two) times daily.  60 tablet  6  . b complex vitamins tablet Take 1 tablet by mouth daily.      . carvedilol (COREG) 25 MG tablet Take 1 tablet (25 mg total) by mouth 2 (two) times daily with a meal.  60 tablet  6  . clopidogrel (PLAVIX) 75 MG tablet Take 1 tablet (75 mg total) by mouth daily.  30 tablet  6  . cyclobenzaprine (FLEXERIL) 10 MG tablet Take 10 mg by mouth 2 (two) times daily as needed for muscle spasms.       [name] docusate sodium (COLACE) 100 MG capsule Take 100 mg by mouth as needed for mild constipation.       [name] ezetimibe (ZETIA) 10 MG tablet Take 1 tablet (10 mg total) by  mouth daily.  30 tablet  6  . furosemide (LASIX) 40 MG tablet Take 1 tablet (40 mg total) by mouth daily as needed for fluid.  30 tablet  6  . gabapentin (NEURONTIN) 300 MG capsule Take 300 mg by mouth at bedtime.      . hydrALAZINE (APRESOLINE) 25 MG tablet Take 3 tablets (75 mg total) by mouth 3 (three) times daily.  270 tablet  6  . isosorbide mononitrate (IMDUR) 30 MG 24 hr tablet TAKE ONE TABLET BY MOUTH TWICE DAILY  60 tablet  6  . loratadine (CLARITIN) 10 MG tablet Take 1 tablet (10 mg total) by mouth daily as needed for allergies.  30 tablet  11  . nitroGLYCERIN (NITROSTAT) 0.4 MG SL tablet Place 1 tablet (0.4 mg total) under the tongue every 5 (five) minutes as needed for chest pain.  25 tablet  3  . oxycodone (ROXICODONE) 30 MG immediate release tablet Take 30 mg by mouth every 8 (eight)  hours as needed for pain.       . potassium chloride SA (K-DUR,KLOR-CON) 20 MEQ tablet Take 1 tablet (20 mEq total) by mouth daily as needed (for muscle pain).  60 tablet  6   No current facility-administered medications for this encounter.    Allergies  Allergen Reactions  . Lipitor [Atorvastatin Calcium] Anaphylaxis    Large doses  . Sulfonamide Derivatives Shortness Of Breath  . Iohexol      Desc: PT HAS ANAPHYLAXIS WITH CONTRAST MEDIA!, Onset Date: [id number]   Code: SOB, Desc: ok w/ 13hr prep//a.c., Onset Date: 13086578   . Latex Other (See Comments)    With long periods of exposure REACTION: Reaction not known  . Zocor [Simvastatin] Other (See Comments)    Muscle cramps    Vital Signs: Filed Vitals:   07/13/14 1451  BP: 110/80  Pulse: 85  Weight: 174 lb 6.4 oz (79.107 kg)  SpO2: 95%    Physical Exam: General:  Well appearing. No resp difficulty HEENT: normal Neck: supple. JVP 5-6. Carotids 2+ bilat; carotid bruits L>R No lymphadenopathy or thryomegaly appreciated. Cor: PMI nondisplaced. Regular rate & rhythm. 2/6 SEM RUSB  Lungs: clear Abdomen: soft, nontender, nondistended. No hepatosplenomegaly. No bruits or masses. Good bowel sounds. Extremities: no cyanosis, clubbing, rash, no edema Neuro: alert & orientedx3, cranial nerves grossly intact. moves all 4 extremities w/o difficulty. Affect pleasant  ASSESSMENT/Plan  1. Chronic systolic HF: Ischemic cardiomyopathy.  ECHO 11/26/12 EF 35-40%.  S/P ICD. NYHA class II symptoms and volume status stable.   - Continue lasix as needed.  - On goal carvedilol 25 mg twice a day - Continue hydralazine 75 mg TID and Imdur 30 mg BID. - Not on ACEI due to CKD and solitary kidney. - Reinforced the need and importance of daily weights, a low sodium diet, and fluid restriction (less than 2 L a day). Instructed to call the HF clinic if weight increases more than 3 lbs overnight or 5 lbs in a week.  2. CAD: s/p CABG.  No chest pain.   Continue Plavix and ASA to 81 .  He cannot tolerate statins due myalgias; we have tried even low-dose and he has not tolerated. Now on Zetia. Will check lipids next visit.  3. CKD: Most recent creatinine was stable at 1.4.  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 4. HTN: Current BP control reasonable, says that SBP < 130 at home.  5. H/o Type I aortic dissection: Follows with CVTS.   Follow  up in 6 months with ECHO  Junie Bame B NP-C  07/13/2014  Patient seen with NP, agree with the above note.  He is doing well and is overall stable.  He will followup in 6 months with echo, no medication changes today.   Loralie Champagne 07/14/2014

## 2014-08-04 ENCOUNTER — Ambulatory Visit: Payer: Medicaid Other | Admitting: Gastroenterology

## 2014-08-10 ENCOUNTER — Telehealth (HOSPITAL_COMMUNITY): Payer: Self-pay | Admitting: Vascular Surgery

## 2014-08-10 ENCOUNTER — Other Ambulatory Visit (HOSPITAL_COMMUNITY): Payer: Self-pay

## 2014-08-10 MED ORDER — CLOPIDOGREL BISULFATE 75 MG PO TABS: 75.0000 mg | ORAL_TABLET | Freq: Every day | ORAL | Status: DC

## 2014-08-10 NOTE — Telephone Encounter (Signed)
Please call [name] from [facility]... About pt medication.. Please advise

## 2014-08-10 NOTE — Telephone Encounter (Signed)
Patient needed refills on plavix, sent to preferred pharmacy electronically.

## 2014-08-30 ENCOUNTER — Encounter (HOSPITAL_COMMUNITY): Payer: Self-pay | Admitting: Emergency Medicine

## 2014-08-30 ENCOUNTER — Emergency Department (HOSPITAL_COMMUNITY)
Admission: EM | Admit: 2014-08-30 | Discharge: 2014-08-30 | Disposition: A | Discharge status: Home / Self Care | Payer: Medicaid Other | Attending: Emergency Medicine | Admitting: Emergency Medicine

## 2014-08-30 ENCOUNTER — Emergency Department (HOSPITAL_COMMUNITY)
Admission: EM | Admit: 2014-08-30 | Discharge: 2014-08-30 | Discharge status: Against Medical Advice | Payer: Medicaid Other | Source: Home / Self Care

## 2014-08-30 DIAGNOSIS — E785 Hyperlipidemia, unspecified: Secondary | ICD-10-CM | POA: Insufficient documentation

## 2014-08-30 DIAGNOSIS — Z7901 Long term (current) use of anticoagulants: Secondary | ICD-10-CM | POA: Diagnosis not present

## 2014-08-30 DIAGNOSIS — I2510 Atherosclerotic heart disease of native coronary artery without angina pectoris: Secondary | ICD-10-CM | POA: Insufficient documentation

## 2014-08-30 DIAGNOSIS — N189 Chronic kidney disease, unspecified: Secondary | ICD-10-CM | POA: Insufficient documentation

## 2014-08-30 DIAGNOSIS — G8929 Other chronic pain: Secondary | ICD-10-CM | POA: Insufficient documentation

## 2014-08-30 DIAGNOSIS — Z951 Presence of aortocoronary bypass graft: Secondary | ICD-10-CM

## 2014-08-30 DIAGNOSIS — Z79899 Other long term (current) drug therapy: Secondary | ICD-10-CM | POA: Diagnosis not present

## 2014-08-30 DIAGNOSIS — Z91040 Latex allergy status: Secondary | ICD-10-CM | POA: Diagnosis not present

## 2014-08-30 DIAGNOSIS — M5430 Sciatica, unspecified side: Secondary | ICD-10-CM | POA: Diagnosis present

## 2014-08-30 DIAGNOSIS — I129 Hypertensive chronic kidney disease with stage 1 through stage 4 chronic kidney disease, or unspecified chronic kidney disease: Secondary | ICD-10-CM

## 2014-08-30 DIAGNOSIS — I5022 Chronic systolic (congestive) heart failure: Secondary | ICD-10-CM | POA: Diagnosis not present

## 2014-08-30 DIAGNOSIS — Z8639 Personal history of other endocrine, nutritional and metabolic disease: Secondary | ICD-10-CM | POA: Insufficient documentation

## 2014-08-30 DIAGNOSIS — M5431 Sciatica, right side: Secondary | ICD-10-CM

## 2014-08-30 DIAGNOSIS — Z95810 Presence of automatic (implantable) cardiac defibrillator: Secondary | ICD-10-CM | POA: Insufficient documentation

## 2014-08-30 DIAGNOSIS — Z85850 Personal history of malignant neoplasm of thyroid: Secondary | ICD-10-CM | POA: Diagnosis not present

## 2014-08-30 DIAGNOSIS — G8921 Chronic pain due to trauma: Secondary | ICD-10-CM

## 2014-08-30 DIAGNOSIS — M549 Dorsalgia, unspecified: Secondary | ICD-10-CM | POA: Insufficient documentation

## 2014-08-30 DIAGNOSIS — Z862 Personal history of diseases of the blood and blood-forming organs and certain disorders involving the immune mechanism: Secondary | ICD-10-CM | POA: Insufficient documentation

## 2014-08-30 MED ORDER — HYDROMORPHONE HCL PF 1 MG/ML IJ SOLN
1.0000 mg | Freq: Once | INTRAMUSCULAR | Status: AC
  Administered 2014-08-30: 1 mg via INTRAMUSCULAR
  Filled 2014-08-30: qty 1

## 2014-08-30 NOTE — ED Notes (Addendum)
PT missed appt at pain clinic on [address]. Pt needs to refill on Rx oxycodone30mg .

## 2014-08-30 NOTE — ED Notes (Signed)
Pt is out of his oxy 30mg 

## 2014-08-30 NOTE — ED Notes (Signed)
Declined W/C at D/C and was escorted to lobby by RN. 

## 2014-08-30 NOTE — Discharge Instructions (Signed)

## 2014-08-30 NOTE — ED Notes (Signed)
Pt has chronic back pain from injury in 2008, pt states increased pain in back going both legs x 2-3 days

## 2014-08-30 NOTE — ED Provider Notes (Signed)
CSN: [id number]     Arrival date & time 08/30/14  1005 History  This chart was scribed for non-physician practitioner, Charlann Lange, PA-C working with Hoy Morn, MD, by Erling Conte, ED Scribe. This patient was seen in room TR08C/TR08C and the patient's care was started at 10:42 AM.  Chief Complaint  Patient presents with  . pain meds      The history is provided by the patient. No language interpreter was used.   HPI Comments: Chad [name] is a 53 y.o. male with a h.o CAD, HTN, chronic systolic heart failure, thoracoabdominal aortic dissection, CRI, thyroid cancer, HLD, anemia, gout, AICD, and carotid stenosis, who presents to the Emergency Department for a refill of her oxycodone medication. Pt has a h/o of sciatica and has had 2 surgeries on his back. The back pain is R > L. Pt missed his app at the [facility] [address] due to work. The back pain radiates to his bilateral lower extremities. He takes 30 mg oxycodone. He is wanting to find a new pain clinic. States he is unable to take Ibuprofen cause he has issues with his liver. Pt works in [employer]. He denies any other complaints at this time.    Past Medical History  Diagnosis Date  . CAD (coronary artery disease)     a. s/p CABG 2006;  b. DES to PDA 2011 (cath: Dx not seen, dRCA/PDA tx with DES; S-PDA occluded (culprit), S-Dx occluded, S-RI and OM ok, L-LAD ok  . HTN (hypertension)     severe  . Chronic systolic heart failure     a. 12/13 ECHO: EF 35-40%, sept, apical & posterobasal HK, LV mod dil & sys fx mod reduced, mild AI, MV mild reg, TV mild reg  . 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 CRI (chronic renal insufficiency)   . Thyroid cancer     Hertle Cell  . HLD (hyperlipidemia)   . Anemia   . Gout   . AICD (automatic cardioverter/defibrillator) present   . Carotid stenosis     dopplers 2011: 0-39% bilat.  . Chest pain syndrome    Past Surgical History  Procedure Laterality  Date  . Coronary artery bypass graft  06/21/2009    x2  . Cardiac defibrillator placement      [employer]  . Thyroidectomy, partial  06/20/11  . Lumbar disc surgery      x 2, most recent within 5-10 years  . Status post emergency repair of a type a ascending aortic dissection with a hemiarch reconstruction of the ascending aorta  using a 28-mm hemashield graft with redo sternotomy and revision of previous bypass grafts in june 2010.    . Median sternotomy,cabg x 7  06/29/2005   Family History  Problem Relation Age of Onset  . Coronary artery disease    . Hypertension Father   . Heart disease Father   . Early death Father   . COPD Father   . Hypertension Mother    History  Substance Use Topics  . Smoking status: Never Smoker   . Smokeless tobacco: Never Used  . Alcohol Use: No    Review of Systems  Gastrointestinal: Negative for abdominal pain.       No bowel incontinence  Genitourinary:       No urinary incontinence  Musculoskeletal: Positive for back pain (chronic. R > L).  All other systems reviewed and are negative.  Allergies  Lipitor; Sulfonamide derivatives; Iohexol; Latex; and Zocor  Home Medications   Prior to Admission medications   Medication Sig Start Date End Date Taking? Authorizing Provider  amLODipine (NORVASC) 5 MG tablet Take 1 tablet (5 mg total) by mouth 2 (two) times daily. 06/25/14   Jolaine Artist, MD  b complex vitamins tablet Take 1 tablet by mouth daily.    Historical Provider, MD  carvedilol (COREG) 25 MG tablet Take 1 tablet (25 mg total) by mouth 2 (two) times daily with a meal. 06/25/14   Jolaine Artist, MD  clopidogrel (PLAVIX) 75 MG tablet Take 1 tablet (75 mg total) by mouth daily. 08/10/14   Jolaine Artist, MD  cyclobenzaprine (FLEXERIL) 10 MG tablet Take 10 mg by mouth 2 (two) times daily as needed for muscle spasms.     Historical Provider, MD  docusate sodium (COLACE) 100 MG capsule Take 100 mg by mouth as needed  for mild constipation.     Historical Provider, MD  ezetimibe (ZETIA) 10 MG tablet Take 1 tablet (10 mg total) by mouth daily. 06/25/14   Jolaine Artist, MD  furosemide (LASIX) 40 MG tablet Take 1 tablet (40 mg total) by mouth daily as needed for fluid. 06/25/14   Jolaine Artist, MD  gabapentin (NEURONTIN) 300 MG capsule Take 300 mg by mouth at bedtime.    Historical Provider, MD  hydrALAZINE (APRESOLINE) 25 MG tablet Take 3 tablets (75 mg total) by mouth 3 (three) times daily. 06/25/14   Jolaine Artist, MD  isosorbide mononitrate (IMDUR) 30 MG 24 hr tablet TAKE ONE TABLET BY MOUTH TWICE DAILY 06/25/14   Jolaine Artist, MD  loratadine (CLARITIN) 10 MG tablet Take 1 tablet (10 mg total) by mouth daily as needed for allergies. 07/29/13   Amy D Ninfa Meeker, NP  nitroGLYCERIN (NITROSTAT) 0.4 MG SL tablet Place 1 tablet (0.4 mg total) under the tongue every 5 (five) minutes as needed for chest pain. 07/22/13   Jolaine Artist, MD  oxycodone (ROXICODONE) 30 MG immediate release tablet Take 30 mg by mouth every 8 (eight) hours as needed for pain.     Historical Provider, MD  potassium chloride SA (K-DUR,KLOR-CON) 20 MEQ tablet Take 1 tablet (20 mEq total) by mouth daily as needed (for muscle pain). 06/25/14   Jolaine Artist, MD   Triage Vitals: BP 123/74  Pulse 76  Temp(Src) 98.2 F (36.8 C) (Oral)  Resp 16  Ht 5\' 11"  (1.803 m)  Wt 160 lb (72.576 kg)  BMI 22.33 kg/m2  SpO2 100%  Physical Exam  Nursing note and vitals reviewed. Constitutional: He is oriented to person, place, and time. He appears well-developed and well-nourished. No distress.  HENT:  Head: Normocephalic and atraumatic.  Eyes: Conjunctivae and EOM are normal.  Neck: Neck supple. No tracheal deviation present.  Cardiovascular: Normal rate.   Pulmonary/Chest: Effort normal. No respiratory distress.  Abdominal: Soft. Bowel sounds are normal. There is no tenderness.  Musculoskeletal: Normal range of motion.  Significant right  sciatic tenderness. Para-lumbar tenderness.   Neurological: He is alert and oriented to person, place, and time.  Skin: Skin is warm and dry.  Psychiatric: He has a normal mood and affect. His behavior is normal.    ED Course  Procedures (including critical care time)  DIAGNOSTIC STUDIES: Oxygen Saturation is 100% on RA, normal by my interpretation.    COORDINATION OF CARE: 10:50 AM- Advised pt that I can give him medications while in  the ED but I am unable to write him a prescription due to the fact that he is under contract with a pain clinic. Pt requested injection of 30mg  oxycodone. Will order.  Pt advised of plan for treatment and pt agrees.     Labs Review Labs Reviewed - No data to display  Imaging Review No results found.   EKG Interpretation None      MDM   Final diagnoses:  None    1. Chronic back pain  Discussed that we could treat pain here but not with written Rx's given that he is under Pain Mgmt contract. Encouraged follow up with PCP, pain management.  I personally performed the services described in this documentation, which was scribed in my presence. The recorded information has been reviewed and is accurate.     Dewaine Oats, PA-C 08/30/14 1558

## 2014-08-30 NOTE — ED Provider Notes (Signed)
Medical screening examination/treatment/procedure(s) were performed by non-physician practitioner and as supervising physician I was immediately available for consultation/collaboration.   EKG Interpretation None        Hoy Morn, MD 08/30/14 2201

## 2014-09-12 ENCOUNTER — Other Ambulatory Visit (HOSPITAL_COMMUNITY): Payer: Self-pay | Admitting: Internal Medicine

## 2014-10-07 ENCOUNTER — Encounter: Payer: Self-pay | Admitting: Cardiology

## 2014-10-10 ENCOUNTER — Observation Stay (HOSPITAL_COMMUNITY)
Admission: EM | Admit: 2014-10-10 | Discharge: 2014-10-11 | Discharge status: Against Medical Advice | Payer: Medicaid Other | Attending: Family Medicine | Admitting: Family Medicine

## 2014-10-10 ENCOUNTER — Emergency Department (HOSPITAL_COMMUNITY): Payer: Medicaid Other

## 2014-10-10 ENCOUNTER — Encounter (HOSPITAL_COMMUNITY): Payer: Self-pay | Admitting: Emergency Medicine

## 2014-10-10 DIAGNOSIS — Z95810 Presence of automatic (implantable) cardiac defibrillator: Secondary | ICD-10-CM

## 2014-10-10 DIAGNOSIS — I5042 Chronic combined systolic (congestive) and diastolic (congestive) heart failure: Secondary | ICD-10-CM | POA: Diagnosis present

## 2014-10-10 DIAGNOSIS — K8020 Calculus of gallbladder without cholecystitis without obstruction: Secondary | ICD-10-CM | POA: Diagnosis not present

## 2014-10-10 DIAGNOSIS — R0789 Other chest pain: Secondary | ICD-10-CM | POA: Diagnosis not present

## 2014-10-10 DIAGNOSIS — R079 Chest pain, unspecified: Secondary | ICD-10-CM | POA: Diagnosis present

## 2014-10-10 DIAGNOSIS — I129 Hypertensive chronic kidney disease with stage 1 through stage 4 chronic kidney disease, or unspecified chronic kidney disease: Secondary | ICD-10-CM | POA: Diagnosis not present

## 2014-10-10 DIAGNOSIS — R1013 Epigastric pain: Secondary | ICD-10-CM | POA: Diagnosis not present

## 2014-10-10 DIAGNOSIS — I25810 Atherosclerosis of coronary artery bypass graft(s) without angina pectoris: Secondary | ICD-10-CM | POA: Diagnosis present

## 2014-10-10 DIAGNOSIS — R072 Precordial pain: Secondary | ICD-10-CM

## 2014-10-10 DIAGNOSIS — Z79891 Long term (current) use of opiate analgesic: Secondary | ICD-10-CM | POA: Diagnosis not present

## 2014-10-10 DIAGNOSIS — N182 Chronic kidney disease, stage 2 (mild): Secondary | ICD-10-CM

## 2014-10-10 DIAGNOSIS — I10 Essential (primary) hypertension: Secondary | ICD-10-CM | POA: Diagnosis present

## 2014-10-10 DIAGNOSIS — Z8679 Personal history of other diseases of the circulatory system: Secondary | ICD-10-CM

## 2014-10-10 DIAGNOSIS — N183 Chronic kidney disease, stage 3 (moderate): Secondary | ICD-10-CM | POA: Diagnosis not present

## 2014-10-10 DIAGNOSIS — I5043 Acute on chronic combined systolic (congestive) and diastolic (congestive) heart failure: Secondary | ICD-10-CM

## 2014-10-10 DIAGNOSIS — R1084 Generalized abdominal pain: Secondary | ICD-10-CM

## 2014-10-10 DIAGNOSIS — Z951 Presence of aortocoronary bypass graft: Secondary | ICD-10-CM | POA: Diagnosis not present

## 2014-10-10 DIAGNOSIS — I2510 Atherosclerotic heart disease of native coronary artery without angina pectoris: Secondary | ICD-10-CM | POA: Insufficient documentation

## 2014-10-10 DIAGNOSIS — C73 Malignant neoplasm of thyroid gland: Secondary | ICD-10-CM | POA: Diagnosis present

## 2014-10-10 DIAGNOSIS — E782 Mixed hyperlipidemia: Secondary | ICD-10-CM | POA: Insufficient documentation

## 2014-10-10 DIAGNOSIS — I252 Old myocardial infarction: Secondary | ICD-10-CM

## 2014-10-10 DIAGNOSIS — R109 Unspecified abdominal pain: Secondary | ICD-10-CM

## 2014-10-10 DIAGNOSIS — Z85850 Personal history of malignant neoplasm of thyroid: Secondary | ICD-10-CM | POA: Diagnosis not present

## 2014-10-10 DIAGNOSIS — Z79899 Other long term (current) drug therapy: Secondary | ICD-10-CM | POA: Diagnosis not present

## 2014-10-10 DIAGNOSIS — Z7902 Long term (current) use of antithrombotics/antiplatelets: Secondary | ICD-10-CM | POA: Insufficient documentation

## 2014-10-10 DIAGNOSIS — I5032 Chronic diastolic (congestive) heart failure: Secondary | ICD-10-CM

## 2014-10-10 DIAGNOSIS — R1011 Right upper quadrant pain: Secondary | ICD-10-CM | POA: Diagnosis not present

## 2014-10-10 DIAGNOSIS — I7103 Dissection of thoracoabdominal aorta: Secondary | ICD-10-CM | POA: Diagnosis present

## 2014-10-10 LAB — CBC WITH DIFFERENTIAL/PLATELET
Basophils Absolute: 0 10*3/uL (ref 0.0–0.1)
Basophils Relative: 0 % (ref 0–1)
Eosinophils Absolute: 0.2 10*3/uL (ref 0.0–0.7)
Eosinophils Relative: 7 % — ABNORMAL HIGH (ref 0–5)
HCT: 36.4 % — ABNORMAL LOW (ref 39.0–52.0)
Hemoglobin: 11.9 g/dL — ABNORMAL LOW (ref 13.0–17.0)
LYMPHS ABS: 1 10*3/uL (ref 0.7–4.0)
LYMPHS PCT: 34 % (ref 12–46)
MCH: 26.4 pg (ref 26.0–34.0)
MCHC: 32.7 g/dL (ref 30.0–36.0)
MCV: 80.7 fL (ref 78.0–100.0)
MONOS PCT: 9 % (ref 3–12)
Monocytes Absolute: 0.3 10*3/uL (ref 0.1–1.0)
NEUTROS ABS: 1.5 10*3/uL — AB (ref 1.7–7.7)
NEUTROS PCT: 50 % (ref 43–77)
PLATELETS: 133 10*3/uL — AB (ref 150–400)
RBC: 4.51 MIL/uL (ref 4.22–5.81)
RDW: 14 % (ref 11.5–15.5)
WBC: 3 10*3/uL — AB (ref 4.0–10.5)

## 2014-10-10 LAB — TROPONIN I
Troponin I: <0.3 ng/mL (ref <0.30)
Troponin I: <0.3 ng/mL (ref <0.30)
Troponin I: <0.3 ng/mL (ref <0.30)
Troponin I: <0.3 ng/mL (ref <0.30)

## 2014-10-10 LAB — URINALYSIS, ROUTINE W REFLEX MICROSCOPIC
BILIRUBIN URINE: NEGATIVE
GLUCOSE, UA: NEGATIVE mg/dL
Hgb urine dipstick: NEGATIVE
KETONES UR: NEGATIVE mg/dL
Leukocytes, UA: NEGATIVE
NITRITE: NEGATIVE
PH: 5 (ref 5.0–8.0)
Protein, ur: NEGATIVE mg/dL
Specific Gravity, Urine: 1.018 (ref 1.005–1.030)
Urobilinogen, UA: 0.2 mg/dL (ref 0.0–1.0)

## 2014-10-10 LAB — PRO B NATRIURETIC PEPTIDE: Pro B Natriuretic peptide (BNP): 1002 pg/mL — ABNORMAL HIGH (ref 0–125)

## 2014-10-10 LAB — COMPREHENSIVE METABOLIC PANEL
ALK PHOS: 48 U/L (ref 39–117)
ALT: 9 U/L (ref 0–53)
ANION GAP: 13 (ref 5–15)
AST: 13 U/L (ref 0–37)
Albumin: 3.7 g/dL (ref 3.5–5.2)
BILIRUBIN TOTAL: 0.4 mg/dL (ref 0.3–1.2)
BUN: 21 mg/dL (ref 6–23)
CHLORIDE: 104 meq/L (ref 96–112)
CO2: 23 meq/L (ref 19–32)
Calcium: 8.5 mg/dL (ref 8.4–10.5)
Creatinine, Ser: 1.41 mg/dL — ABNORMAL HIGH (ref 0.50–1.35)
GFR, EST AFRICAN AMERICAN: 65 mL/min — AB (ref >90)
GFR, EST NON AFRICAN AMERICAN: 56 mL/min — AB (ref >90)
GLUCOSE: 109 mg/dL — AB (ref 70–99)
POTASSIUM: 3.7 meq/L (ref 3.7–5.3)
SODIUM: 140 meq/L (ref 137–147)
Total Protein: 6.9 g/dL (ref 6.0–8.3)

## 2014-10-10 LAB — CBC
HEMATOCRIT: 37.9 % — AB (ref 39.0–52.0)
Hemoglobin: 12.7 g/dL — ABNORMAL LOW (ref 13.0–17.0)
MCH: 27.2 pg (ref 26.0–34.0)
MCHC: 33.5 g/dL (ref 30.0–36.0)
MCV: 81.2 fL (ref 78.0–100.0)
Platelets: 136 10*3/uL — ABNORMAL LOW (ref 150–400)
RBC: 4.67 MIL/uL (ref 4.22–5.81)
RDW: 14 % (ref 11.5–15.5)
WBC: 3 10*3/uL — ABNORMAL LOW (ref 4.0–10.5)

## 2014-10-10 LAB — CREATININE, SERUM
CREATININE: 1.4 mg/dL — AB (ref 0.50–1.35)
GFR calc Af Amer: 66 mL/min — ABNORMAL LOW (ref >90)
GFR calc non Af Amer: 57 mL/min — ABNORMAL LOW (ref >90)

## 2014-10-10 LAB — LIPASE, BLOOD: Lipase: 23 U/L (ref 11–59)

## 2014-10-10 MED ORDER — OXYCODONE HCL 5 MG PO TABS
15.0000 mg | ORAL_TABLET | Freq: Three times a day (TID) | ORAL | Status: DC | PRN
  Administered 2014-10-10 – 2014-10-11 (×2): 30 mg via ORAL
  Filled 2014-10-10 (×2): qty 6

## 2014-10-10 MED ORDER — HYDRALAZINE HCL 50 MG PO TABS
50.0000 mg | ORAL_TABLET | Freq: Three times a day (TID) | ORAL | Status: DC
  Administered 2014-10-10: 50 mg via ORAL
  Filled 2014-10-10 (×5): qty 1

## 2014-10-10 MED ORDER — DIPHENHYDRAMINE HCL 50 MG PO CAPS
50.0000 mg | ORAL_CAPSULE | Freq: Once | ORAL | Status: AC
Start: 2014-10-11 — End: 2014-10-11
  Administered 2014-10-11: 50 mg via ORAL
  Filled 2014-10-10: qty 1
  Filled 2014-10-10: qty 2

## 2014-10-10 MED ORDER — POTASSIUM CHLORIDE CRYS ER 20 MEQ PO TBCR
20.0000 meq | EXTENDED_RELEASE_TABLET | Freq: Two times a day (BID) | ORAL | Status: DC
  Administered 2014-10-10: 20 meq via ORAL
  Filled 2014-10-10 (×3): qty 1

## 2014-10-10 MED ORDER — ISOSORBIDE MONONITRATE 15 MG HALF TABLET
15.0000 mg | ORAL_TABLET | Freq: Two times a day (BID) | ORAL | Status: DC
  Administered 2014-10-10: 15 mg via ORAL
  Filled 2014-10-10 (×3): qty 1

## 2014-10-10 MED ORDER — CLOPIDOGREL BISULFATE 75 MG PO TABS
75.0000 mg | ORAL_TABLET | Freq: Every day | ORAL | Status: DC
  Administered 2014-10-10 – 2014-10-11 (×2): 75 mg via ORAL
  Filled 2014-10-10 (×2): qty 1

## 2014-10-10 MED ORDER — OXYCODONE HCL 5 MG PO TABS: 15.0000 mg | ORAL_TABLET | Freq: Three times a day (TID) | ORAL | Status: DC | PRN

## 2014-10-10 MED ORDER — INFLUENZA VAC SPLIT QUAD 0.5 ML IM SUSY
0.5000 mL | PREFILLED_SYRINGE | INTRAMUSCULAR | Status: DC
  Filled 2014-10-10: qty 0.5

## 2014-10-10 MED ORDER — PREDNISONE 50 MG PO TABS
50.0000 mg | ORAL_TABLET | Freq: Once | ORAL | Status: AC
  Administered 2014-10-11: 50 mg via ORAL
  Filled 2014-10-10 (×3): qty 1

## 2014-10-10 MED ORDER — CALCIUM CARBONATE ANTACID 500 MG PO CHEW
1.0000 | CHEWABLE_TABLET | Freq: Two times a day (BID) | ORAL | Status: DC | PRN
  Filled 2014-10-10: qty 1

## 2014-10-10 MED ORDER — PREDNISONE 50 MG PO TABS
50.0000 mg | ORAL_TABLET | Freq: Once | ORAL | Status: AC
  Administered 2014-10-11: 50 mg via ORAL
  Filled 2014-10-10 (×2): qty 1

## 2014-10-10 MED ORDER — ONDANSETRON HCL 4 MG/2ML IJ SOLN: 4.0000 mg | Freq: Four times a day (QID) | INTRAMUSCULAR | Status: DC | PRN

## 2014-10-10 MED ORDER — CARVEDILOL 12.5 MG PO TABS
12.5000 mg | ORAL_TABLET | Freq: Two times a day (BID) | ORAL | Status: DC
  Administered 2014-10-10 – 2014-10-11 (×2): 12.5 mg via ORAL
  Filled 2014-10-10 (×5): qty 1

## 2014-10-10 MED ORDER — DOCUSATE SODIUM 100 MG PO CAPS
100.0000 mg | ORAL_CAPSULE | ORAL | Status: DC | PRN
  Filled 2014-10-10: qty 1

## 2014-10-10 MED ORDER — ACETAMINOPHEN 325 MG PO TABS
650.0000 mg | ORAL_TABLET | ORAL | Status: DC | PRN
  Filled 2014-10-10: qty 2

## 2014-10-10 MED ORDER — HEPARIN SODIUM (PORCINE) 5000 UNIT/ML IJ SOLN
5000.0000 [IU] | Freq: Three times a day (TID) | INTRAMUSCULAR | Status: DC
  Administered 2014-10-11: 5000 [IU] via SUBCUTANEOUS
  Filled 2014-10-10 (×4): qty 1

## 2014-10-10 MED ORDER — PREDNISONE 50 MG PO TABS
50.0000 mg | ORAL_TABLET | Freq: Once | ORAL | Status: AC
  Administered 2014-10-10: 50 mg via ORAL
  Filled 2014-10-10: qty 1

## 2014-10-10 MED ORDER — EZETIMIBE 10 MG PO TABS
10.0000 mg | ORAL_TABLET | Freq: Every day | ORAL | Status: DC
  Administered 2014-10-10 – 2014-10-11 (×2): 10 mg via ORAL
  Filled 2014-10-10 (×2): qty 1

## 2014-10-10 MED ORDER — CYCLOBENZAPRINE HCL 10 MG PO TABS: 5.0000 mg | ORAL_TABLET | Freq: Every day | ORAL | Status: DC | PRN

## 2014-10-10 NOTE — ED Notes (Signed)
Pt arrives from home via [facility]. Pt c/o left sided chest pain. Pt took nitro X4 prior to EMS arrival with relief. Pt c/o of abdominal pain 10/10. Pt reports an extensive Cardiac Hx with several stent placement an Aortic disection and open heart surgery. Pt reports having a demand pacemaker. Pt denies chest pain upon arrival to ED.

## 2014-10-10 NOTE — H&P (Signed)
[facility] Admission History and Physical Service Pager: [phone]  Patient name: Chad [name] Medical record number: [id number] Date of birth: 02-May-1961 Age: 53 y.o. Gender: male  Primary Care Provider: Christa See, MD Consultants: Cardiology Code Status: Full code, need to discuss with patient  Chief Complaint: chest pain  Assessment and Plan: Chad [name] is a 53 y.o. male presenting with chest pain . 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 Afebrile, VSS with BP borderline soft, currently 111/61. Satting well on RA. Trop neg x 2. CXR with stable enlarged descending thoracic aorta. BNP 1002 from 589 and 11/2012 echo LV EF 35-40% but no signs of fluid overload on exam. Very significant hx with dissection. Heart score 4 (age, risk factors with prior MI) - Admit to Family Practice Teaching Service to telemetry, Dr Andria Frames attending.  - CT angio chest/abd/pelvis with reported anaphylaxis to contrast but prior studies have been done with contrast. Pre-study prednisone protocol via radiology (prednisone 50mg  13 hr, 7hr, and 1 hr prior to CTA, benadryl 50mg  1 hr prior to CTA) ordered. [facility] cardiology consulted and called. Appreciate recs. - troponin x 3, EKG AM - Bed rest, NPO pending abd [place] and CT angiogram. - Continued plavix, coreg (decreased to 12.5mg BID), zetia. Holding norvasc, nitro and lasix pending improvement in soft BP. Started hydralaxine at lower dose (50mg  TID) and imdur at lower dose (15mg  BID) with HOLD parameters for SBP<120 or DBP<80 - Tums to see if helps; can try GI cocktail if TUMS fails - AM A1c and Lipids as not done in some time  Abdominal pain Resolved. H/o aortic dissection with graft. H/o MI. No throbbing abdominal mass. Transaminases and  lipase WNL. A/w loose stool. Possibly viral gastro vs anxiety.  - Home tylenol and zofran PRN. - NPO pending Abd [place] - CTangio abd/pelvis/chest. Renal fcn at baseline.  Anemia with Hgb 11.9 Leukopenia WBC 3 From around 13 baseline. MCV low end of normal.  - Recheck tomorrow. If persistently low, check FOBT and discuss o/p colonoscopy if not done.  CHF Stable appearing on exam.  - Holding lasix and kdur for now  FEN/GI: NPO; carefully hydrate with h/o systolic and diastolic CHF. 1/2NS at 50cc/hr. Prophylaxis: SQ heparin  Disposition: Admit pending workup for chest pain  History of Present Illness: Chad [name] is a 53 y.o. male presenting with chest pain. He reports that this morning he woke to feeling abdominal pan that was right below hisheart, going into his back and neck. He was nauseated and because of fear, he was dyspneic. He took NTG 4mg  and checked his BP at 130/79. He then sat on the toilet and had nausea, diarrhea, and chest wall pain that scared him. When he rechecked BP minutes later, it was 160s/105. This really worried him to he came tot he ED. His prior cardiac emergency occurred with dyspnea, pain between shouolders, and intense pressure. This felt different. He denies fevers, chills, sick contacts, blood in stool (though he is not really sure), NSAID use, vomiting, or fainting. He had been dizzy though. He never misses medications. He did not take AM meds except carvedilol , hydralazine, and imdur. He feels okay now. Prior to this episode this morning, he felt in his usual state of health. Denies trauma or any new leg swelling.  Pain had resolved by the time he  was in the ED so he received no medications.  Review Of Systems: Per HPI with the following additions: Denies muscle weakness, reports tignling sensation bilateral fingers. Otherwise 12 point review of systems was performed and was unremarkable.  Patient Active Problem List   Diagnosis Date Noted  . Chest pain  10/10/2014  . Health care maintenance 03/19/2014  . Local reaction to tetanus vaccine 03/19/2014  . Neck pain on left side 06/03/2013  . Penile discharge 02/26/2013  . Syncope 01/17/2013  . Acute on chronic systolic and diastolic heart failure, NYHA class 3 01/16/2013  . Chest pain, mid sternal 01/16/2013  . Low back pain 04/23/2012  . Chest pain 12/04/2011  . Chest pain syndrome   . Insomnia 09/14/2011  . 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 Coronary atherosclerosis of native coronary artery 06/12/2011  . Bradycardia 05/03/2011  . Aortic dissection, thoracoabdominal   . Chronic kidney disease (CKD), stage II (mild)   . ORTHOSTATIC HYPOTENSION 05/03/2010  . CAROTID BRUIT 01/25/2010  . PANCYTOPENIA 11/01/2009  . FATIGUE / MALAISE 10/25/2009  . DISSECTING AORTIC ANEURYSM THORACOABDOMINAL 07/14/2009  . SCIATICA, RIGHT 05/18/2009  . HYPERLIPIDEMIA-MIXED 09/29/2008  . HYPERTENSION, BENIGN 09/29/2008  . CAD, ARTERY BYPASS GRAFT 09/29/2008  . SYSTOLIC HEART FAILURE, CHRONIC 09/29/2008  . ICD - IN SITU 09/29/2008   Past Medical History: Past Medical History  Diagnosis Date  . CAD (coronary artery disease)     a. s/p CABG 2006;  b. DES to PDA 2011 (cath: Dx not seen, dRCA/PDA tx with DES; S-PDA occluded (culprit), S-Dx occluded, S-RI and OM ok, L-LAD ok  . HTN (hypertension)     severe  . Chronic systolic heart failure     a. 12/13 ECHO: EF 35-40%, sept, apical & posterobasal HK, LV mod dil & sys fx mod reduced, mild AI, MV mild reg, TV mild reg  . 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 CRI (chronic renal insufficiency)   . Thyroid cancer     Hertle Cell  . HLD (hyperlipidemia)   . Anemia   . Gout   . AICD (automatic cardioverter/defibrillator) present   . Carotid stenosis     dopplers 2011: 0-39% bilat.  . Chest pain syndrome    Past Surgical History: Past Surgical History  Procedure Laterality Date  .  Coronary artery bypass graft  06/21/2009    x2  . Cardiac defibrillator placement      [employer]  . Thyroidectomy, partial  06/20/11  . Lumbar disc surgery      x 2, most recent within 5-10 years  . Status post emergency repair of a type a ascending aortic dissection with a hemiarch reconstruction of the ascending aorta  using a 28-mm hemashield graft with redo sternotomy and revision of previous bypass grafts in june 2010.    . Median sternotomy,cabg x 7  06/29/2005   Social History: History  Substance Use Topics  . Smoking status: Never Smoker   . Smokeless tobacco: Never Used  . Alcohol Use: No   Additional social history: None Please also refer to relevant sections of EMR.  Family History: Family History  Problem Relation Age of Onset  . Coronary artery disease    . Hypertension Father   . Heart disease Father   . Early death Father   . COPD Father   . Hypertension Mother    Allergies and Medications: Allergies  Allergen Reactions  . Iohexol Anaphylaxis  and Other (See Comments)     Desc: PT HAS ANAPHYLAXIS WITH CONTRAST MEDIA!, Onset Date: [id number]   Code: SOB, Desc: ok w/ 13hr prep//a.c., Onset Date: 29798921   . Lipitor [Atorvastatin Calcium] Anaphylaxis and Other (See Comments)    Large doses  . Sulfonamide Derivatives Shortness Of Breath  . Latex Other (See Comments)    With long periods of exposure REACTION: Reaction not known  . Zocor [Simvastatin] Other (See Comments)    Muscle cramps   No current facility-administered medications on file prior to encounter.   Current Outpatient Prescriptions on File Prior to Encounter  Medication Sig Dispense Refill  . amLODipine (NORVASC) 5 MG tablet Take 1 tablet (5 mg total) by mouth 2 (two) times daily.  60 tablet  6  . carvedilol (COREG) 25 MG tablet Take 1 tablet (25 mg total) by mouth 2 (two) times daily with a meal.  60 tablet  6  . clopidogrel (PLAVIX) 75 MG tablet Take 1 tablet (75 mg total) by mouth  daily.  30 tablet  6  . cyclobenzaprine (FLEXERIL) 10 MG tablet Take 10 mg by mouth daily as needed for muscle spasms.       [name] docusate sodium (COLACE) 100 MG capsule Take 100 mg by mouth as needed for mild constipation.       [name] ezetimibe (ZETIA) 10 MG tablet Take 1 tablet (10 mg total) by mouth daily.  30 tablet  6  . hydrALAZINE (APRESOLINE) 25 MG tablet Take 3 tablets (75 mg total) by mouth 3 (three) times daily.  270 tablet  6  . loratadine (CLARITIN) 10 MG tablet Take 1 tablet (10 mg total) by mouth daily as needed for allergies.  30 tablet  11  . oxycodone (ROXICODONE) 30 MG immediate release tablet Take 30 mg by mouth every 8 (eight) hours as needed for pain.       . furosemide (LASIX) 40 MG tablet Take 1 tablet (40 mg total) by mouth daily as needed for fluid.  30 tablet  6    Objective: BP 120/68  Pulse 72  Temp(Src) 97.7 F (36.5 C) (Oral)  Resp 17  Ht 5\' 11"  (1.803 m)  Wt 160 lb 11.2 oz (72.893 kg)  BMI 22.42 kg/m2  SpO2 98% Exam: General: NAD, tired-appearing, lying in bed HEENT: AT/[place], sclera clear, EOMI, o/p clear, neck supple Cardiovascular: RRR, no m/r/g, 2+ B DP pulses and radial pulses. No apparent JVD. CHEST: No reproducibility of chest pain symptoms Respiratory: CTAB, normal effort Abdomen: S/ND, mild RLQ tenderness, no rebound or guarding, hypoactive bowel sounds Extremities: No LE edema or calf tenderness Skin: No rash or cyanosis; warm and well-perfused Neuro: Awake, alert, no focal deficits, CN 2-12 tested and intact, good grasp strength, 5/5 UE and ankle dorsi- and plantar flexion strength. Sensation intact bilateral hands.  Labs and Imaging: CBC BMET   Recent Labs Lab 10/10/14 0637  WBC 3.0*  HGB 11.9*  HCT 36.4*  PLT 133*    Recent Labs Lab 10/10/14 0637  NA 140  K 3.7  CL 104  CO2 23  BUN 21  CREATININE 1.41*  GLUCOSE 109*  CALCIUM 8.5     DG chest 2 view: IMPRESSION:  No acute chest abnormality.  Stable enlargement of the proximal  descending thoracic aorta.  12 lead EKG: Sinus rhythm, LVH with repol abnormality  Hilton Sinclair, MD 10/10/2014, 2:09 PM PGY-3, [place] [facility] pager: [phone], text pages welcome

## 2014-10-10 NOTE — ED Provider Notes (Signed)
CSN: [id number]     Arrival date & time    History   First MD Initiated Contact with Patient 10/10/14 [phone]     Chief Complaint  Patient presents with  . Chest Pain  . Abdominal Pain     (Consider location/radiation/quality/duration/timing/severity/associated sxs/prior Treatment) HPI Chad [name] is a 53 y.o. male with PMH of extensive cardiac history including cornary artery bypass twice last one in 2010, ICD placement, aortic dissection with graft placement 2010, CHF presenting with left-sided chest pressure under ribcage at 5 AM. Patient went to the bathroom and the chest pressure became more severe. Patient had nausea, diaphoresis. Patient took 4 nitroglycerin, Coreg, hydralazine with relief of pain. Patient's blood pressure was 160/100. Patient without any chest pain in ED. Patient has chest pain once or twice a week. He has not had chest pain like this morning's episode for one year. No back pain or shortness of breath. No abdominal pain. Primary Cardiologist: Dr. Glori Bickers. 11/2012 echo LV EF 35-40%    Past Medical History  Diagnosis Date  . CAD (coronary artery disease)     a. s/p CABG 2006;  b. DES to PDA 2011 (cath: Dx not seen, dRCA/PDA tx with DES; S-PDA occluded (culprit), S-Dx occluded, S-RI and OM ok, L-LAD ok  . HTN (hypertension)     severe  . Chronic systolic heart failure     a. 12/13 ECHO: EF 35-40%, sept, apical & posterobasal HK, LV mod dil & sys fx mod reduced, mild AI, MV mild reg, TV mild reg  . 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 CRI (chronic renal insufficiency)   . Thyroid cancer     Hertle Cell  . HLD (hyperlipidemia)   . Anemia   . Gout   . AICD (automatic cardioverter/defibrillator) present   . Carotid stenosis     dopplers 2011: 0-39% bilat.  . Chest pain syndrome    Past Surgical History  Procedure Laterality Date  . Coronary artery bypass graft  06/21/2009    x2  . Cardiac defibrillator placement       [employer]  . Thyroidectomy, partial  06/20/11  . Lumbar disc surgery      x 2, most recent within 5-10 years  . Status post emergency repair of a type a ascending aortic dissection with a hemiarch reconstruction of the ascending aorta  using a 28-mm hemashield graft with redo sternotomy and revision of previous bypass grafts in june 2010.    . Median sternotomy,cabg x 7  06/29/2005   Family History  Problem Relation Age of Onset  . Coronary artery disease    . Hypertension Father   . Heart disease Father   . Early death Father   . COPD Father   . Hypertension Mother    History  Substance Use Topics  . Smoking status: Never Smoker   . Smokeless tobacco: Never Used  . Alcohol Use: No    Review of Systems  Constitutional: Negative for fever and chills.  HENT: Negative for congestion and rhinorrhea.   Eyes: Negative for visual disturbance.  Respiratory: Negative for cough and shortness of breath.   Cardiovascular: Positive for chest pain. Negative for palpitations.  Gastrointestinal: Positive for nausea. Negative for vomiting and diarrhea.  Genitourinary: Negative for dysuria and hematuria.  Musculoskeletal: Negative for back pain and gait problem.  Skin: Negative for rash.  Neurological: Negative for weakness and headaches.      Allergies  Iohexol; Lipitor; Sulfonamide derivatives; Latex; and Zocor  Home Medications   Prior to Admission medications   Medication Sig Start Date End Date Taking? Authorizing Provider  amLODipine (NORVASC) 5 MG tablet Take 1 tablet (5 mg total) by mouth 2 (two) times daily. 06/25/14  Yes Jolaine Artist, MD  carvedilol (COREG) 25 MG tablet Take 1 tablet (25 mg total) by mouth 2 (two) times daily with a meal. 06/25/14  Yes Jolaine Artist, MD  clopidogrel (PLAVIX) 75 MG tablet Take 1 tablet (75 mg total) by mouth daily. 08/10/14  Yes Jolaine Artist, MD  cyclobenzaprine (FLEXERIL) 10 MG tablet Take 10 mg by mouth daily as needed for  muscle spasms.    Yes Historical Provider, MD  docusate sodium (COLACE) 100 MG capsule Take 100 mg by mouth as needed for mild constipation.    Yes Historical Provider, MD  ezetimibe (ZETIA) 10 MG tablet Take 1 tablet (10 mg total) by mouth daily. 06/25/14  Yes Jolaine Artist, MD  hydrALAZINE (APRESOLINE) 25 MG tablet Take 3 tablets (75 mg total) by mouth 3 (three) times daily. 06/25/14  Yes Jolaine Artist, MD  isosorbide mononitrate (IMDUR) 30 MG 24 hr tablet Take 30 mg by mouth 2 (two) times daily.   Yes Historical Provider, MD  loratadine (CLARITIN) 10 MG tablet Take 1 tablet (10 mg total) by mouth daily as needed for allergies. 07/29/13  Yes Amy D Clegg, NP  nitroGLYCERIN (NITROSTAT) 0.4 MG SL tablet Place 0.4 mg under the tongue every 5 (five) minutes as needed for chest pain.   Yes Historical Provider, MD  OVER THE COUNTER MEDICATION Take 1 capsule by mouth daily. "Mega Men 50 Plus"   Yes Historical Provider, MD  oxycodone (ROXICODONE) 30 MG immediate release tablet Take 30 mg by mouth every 8 (eight) hours as needed for pain.    Yes Historical Provider, MD  potassium chloride SA (K-DUR,KLOR-CON) 20 MEQ tablet Take 20 mEq by mouth 2 (two) times daily.   Yes Historical Provider, MD  furosemide (LASIX) 40 MG tablet Take 1 tablet (40 mg total) by mouth daily as needed for fluid. 06/25/14   Jolaine Artist, MD   BP 111/68  Pulse 68  Resp 12  Ht 6' (1.829 m)  Wt 158 lb (71.668 kg)  BMI 21.42 kg/m2  SpO2 95% Physical Exam  Nursing note and vitals reviewed. Constitutional: He appears well-developed and well-nourished. No distress.  HENT:  Head: Normocephalic and atraumatic.  Eyes: Conjunctivae and EOM are normal. Right eye exhibits no discharge. Left eye exhibits no discharge.  Neck: Normal range of motion. No JVD present.  Cardiovascular: Normal rate, regular rhythm, normal heart sounds and intact distal pulses.   No peripheral edema or tenderness  Pulmonary/Chest: Effort normal and  breath sounds normal. No respiratory distress. He has no wheezes.  Abdominal: Soft. Bowel sounds are normal. He exhibits no distension. There is no tenderness.  Neurological: He is alert. He exhibits normal muscle tone. Coordination normal.  Skin: Skin is warm and dry. He is not diaphoretic.    ED Course  Procedures (including critical care time) Labs Review Labs Reviewed  CBC WITH DIFFERENTIAL - Abnormal; Notable for the following:    WBC 3.0 (*)    Hemoglobin 11.9 (*)    HCT 36.4 (*)    Platelets 133 (*)    Neutro Abs 1.5 (*)    Eosinophils Relative 7 (*)    All other components within normal limits  COMPREHENSIVE METABOLIC  PANEL - Abnormal; Notable for the following:    Glucose, Bld 109 (*)    Creatinine, Ser 1.41 (*)    GFR calc non Af Amer 56 (*)    GFR calc Af Amer 65 (*)    All other components within normal limits  PRO B NATRIURETIC PEPTIDE - Abnormal; Notable for the following:    Pro B Natriuretic peptide (BNP) 1002.0 (*)    All other components within normal limits  TROPONIN I  LIPASE, BLOOD  URINALYSIS, ROUTINE W REFLEX MICROSCOPIC  TROPONIN I    Imaging Review Dg Chest 2 View  10/10/2014   CLINICAL DATA:  53 year old with mid left abdominal pain and nausea. Patient also felt chest discomfort and felt like he was having a heart attack.  EXAM: CHEST  2 VIEW  COMPARISON:  03/11/2014  FINDINGS: There is stable enlargement of the proximal descending thoracic aorta. Left cardiac ICD is in a stable position. The lungs are clear without airspace disease or pulmonary edema. Postsurgical changes compatible with a CABG. Few linear densities at left lung base could represent mild atelectasis. No pleural effusions.  IMPRESSION: No acute chest abnormality.  Stable enlargement of the proximal descending thoracic aorta.   Electronically Signed   By: Markus Daft M.D.   On: 10/10/2014 07:47     EKG Interpretation   Date/Time:  Saturday October 10 2014 06:53:01 EDT Ventricular  Rate:  68 PR Interval:  198 QRS Duration: 101 QT Interval:  424 QTC Calculation: 451 R Axis:   -5 Text Interpretation:  Sinus rhythm Left atrial enlargement LVH with  secondary repolarization abnormality No significant change was found  Confirmed by [facility]  MD, TREY ([id number]) on 10/10/2014 11:12:56 AM     Meds given in ED:  Medications - No data to display  New Prescriptions   No medications on file      MDM   Final diagnoses:  Chest pain  Chronic diastolic heart failure  H/O aortic dissection   Concern for cardiac etiology of Chest Pain. Patient with L chest pressure with nausea, diaphoresis. Resolved with nitro. VSS. NAD, heart RRR, Lungs clear. No pain in ED. Patient with negative troponin, CXR and EKG without acute changes. BNP 1002. Patient with extensive cardiac history including aortic dissection and graft placement in 2010. Concern with the pain being related to that. Patient has anaphylaxis reaction to contrast. Spoke with radiology who recommend a 13 hour prep including Prednisone 50mg  13, 7, and 1 hour prior to study as well as benadryl 50 PO or IV 1 hour prior. Consult to family medicine service who agree to admit to Dr. Lowella Bandy service to telemetry bed.  Discussed all results and patient verbalizes understanding and agrees with plan.  This is a shared patient. This patient was discussed with the physician who saw and evaluated the patient and agrees with the plan.     Pura Spice, PA-C 10/10/14 [place], [place] 10/11/14 1616

## 2014-10-10 NOTE — ED Notes (Addendum)
Spoke with PA, Karrie Meres regarding patient.  He is anaphylactic to Iohexol (contrast) and would need 13 hour pre-medication for CTA.  She plans to discuss with attending first, no new orders received.

## 2014-10-11 ENCOUNTER — Inpatient Hospital Stay (HOSPITAL_COMMUNITY): Payer: Medicaid Other

## 2014-10-11 DIAGNOSIS — N182 Chronic kidney disease, stage 2 (mild): Secondary | ICD-10-CM

## 2014-10-11 DIAGNOSIS — I5043 Acute on chronic combined systolic (congestive) and diastolic (congestive) heart failure: Secondary | ICD-10-CM

## 2014-10-11 DIAGNOSIS — Z95810 Presence of automatic (implantable) cardiac defibrillator: Secondary | ICD-10-CM

## 2014-10-11 DIAGNOSIS — I10 Essential (primary) hypertension: Secondary | ICD-10-CM

## 2014-10-11 DIAGNOSIS — R072 Precordial pain: Secondary | ICD-10-CM

## 2014-10-11 DIAGNOSIS — R1013 Epigastric pain: Secondary | ICD-10-CM

## 2014-10-11 DIAGNOSIS — R1011 Right upper quadrant pain: Secondary | ICD-10-CM | POA: Diagnosis not present

## 2014-10-11 DIAGNOSIS — I5042 Chronic combined systolic (congestive) and diastolic (congestive) heart failure: Secondary | ICD-10-CM | POA: Diagnosis not present

## 2014-10-11 DIAGNOSIS — I7103 Dissection of thoracoabdominal aorta: Secondary | ICD-10-CM

## 2014-10-11 DIAGNOSIS — R0789 Other chest pain: Secondary | ICD-10-CM | POA: Diagnosis not present

## 2014-10-11 DIAGNOSIS — R079 Chest pain, unspecified: Secondary | ICD-10-CM

## 2014-10-11 DIAGNOSIS — C73 Malignant neoplasm of thyroid gland: Secondary | ICD-10-CM

## 2014-10-11 LAB — CBC
HCT: 39.4 % (ref 39.0–52.0)
Hemoglobin: 12.9 g/dL — ABNORMAL LOW (ref 13.0–17.0)
MCH: 26.7 pg (ref 26.0–34.0)
MCHC: 32.7 g/dL (ref 30.0–36.0)
MCV: 81.4 fL (ref 78.0–100.0)
Platelets: 141 10*3/uL — ABNORMAL LOW (ref 150–400)
RBC: 4.84 MIL/uL (ref 4.22–5.81)
RDW: 14 % (ref 11.5–15.5)
WBC: 3.1 10*3/uL — ABNORMAL LOW (ref 4.0–10.5)

## 2014-10-11 LAB — BASIC METABOLIC PANEL
Anion gap: 10 (ref 5–15)
BUN: 19 mg/dL (ref 6–23)
CALCIUM: 9.2 mg/dL (ref 8.4–10.5)
CO2: 26 mEq/L (ref 19–32)
Chloride: 104 mEq/L (ref 96–112)
Creatinine, Ser: 1.5 mg/dL — ABNORMAL HIGH (ref 0.50–1.35)
GFR calc Af Amer: 61 mL/min — ABNORMAL LOW (ref >90)
GFR calc non Af Amer: 52 mL/min — ABNORMAL LOW (ref >90)
GLUCOSE: 138 mg/dL — AB (ref 70–99)
POTASSIUM: 4.5 meq/L (ref 3.7–5.3)
Sodium: 140 mEq/L (ref 137–147)

## 2014-10-11 LAB — LIPID PANEL
CHOLESTEROL: 159 mg/dL (ref 0–200)
HDL: 57 mg/dL (ref >39)
LDL Cholesterol: 92 mg/dL (ref 0–99)
Total CHOL/HDL Ratio: 2.8 RATIO
Triglycerides: 52 mg/dL (ref <150)
VLDL: 10 mg/dL (ref 0–40)

## 2014-10-11 LAB — HEMOGLOBIN A1C
HEMOGLOBIN A1C: 6 % — AB (ref <5.7)
MEAN PLASMA GLUCOSE: 126 mg/dL — AB (ref <117)

## 2014-10-11 MED ORDER — SODIUM CHLORIDE 0.45 % IV SOLN
INTRAVENOUS | Status: DC
  Administered 2014-10-11: 07:00:00 via INTRAVENOUS
  Filled 2014-10-11: qty 1000

## 2014-10-11 MED ORDER — POTASSIUM CHLORIDE CRYS ER 20 MEQ PO TBCR: 20.0000 meq | EXTENDED_RELEASE_TABLET | Freq: Two times a day (BID) | ORAL | Status: DC

## 2014-10-11 MED ORDER — IOHEXOL 350 MG/ML SOLN
70.0000 mL | Freq: Once | INTRAVENOUS | Status: AC | PRN
  Administered 2014-10-11: 70 mL via INTRAVENOUS

## 2014-10-11 MED ORDER — AMLODIPINE BESYLATE 5 MG PO TABS
5.0000 mg | ORAL_TABLET | Freq: Two times a day (BID) | ORAL | Status: DC
  Administered 2014-10-11: 5 mg via ORAL
  Filled 2014-10-11 (×2): qty 1

## 2014-10-11 MED ORDER — FUROSEMIDE 40 MG PO TABS
40.0000 mg | ORAL_TABLET | Freq: Every day | ORAL | Status: DC | PRN
  Filled 2014-10-11: qty 1

## 2014-10-11 MED ORDER — ISOSORBIDE MONONITRATE ER 30 MG PO TB24
30.0000 mg | ORAL_TABLET | Freq: Every day | ORAL | Status: DC
  Administered 2014-10-11: 30 mg via ORAL
  Filled 2014-10-11: qty 1

## 2014-10-11 NOTE — Discharge Instructions (Signed)
Generalized Anxiety Disorder Generalized anxiety disorder (GAD) is a mental disorder. It interferes with life functions, including relationships, work, and school. GAD is different from normal anxiety, which everyone experiences at some point in their lives in response to specific life events and activities. Normal anxiety actually helps us prepare for and get through these life events and activities. Normal anxiety goes away after the event or activity is over.  GAD causes anxiety that is not necessarily related to specific events or activities. It also causes excess anxiety in proportion to specific events or activities. The anxiety associated with GAD is also difficult to control. GAD can vary from mild to severe. People with severe GAD can have intense waves of anxiety with physical symptoms (panic attacks).  SYMPTOMS The anxiety and worry associated with GAD are difficult to control. This anxiety and worry are related to many life events and activities and also occur more days than not for 6 months or longer. People with GAD also have three or more of the following symptoms (one or more in children):  Restlessness.   Fatigue.  Difficulty concentrating.   Irritability.  Muscle tension.  Difficulty sleeping or unsatisfying sleep. DIAGNOSIS GAD is diagnosed through an assessment by your health care provider. Your health care provider will ask you questions aboutyour mood,physical symptoms, and events in your life. Your health care provider may ask you about your medical history and use of alcohol or drugs, including prescription medicines. Your health care provider may also do a physical exam and blood tests. Certain medical conditions and the use of certain substances can cause symptoms similar to those associated with GAD. Your health care provider may refer you to a mental health specialist for further evaluation. TREATMENT The following therapies are usually used to treat GAD:    Medication. Antidepressant medication usually is prescribed for long-term daily control. Antianxiety medicines may be added in severe cases, especially when panic attacks occur.   Talk therapy (psychotherapy). Certain types of talk therapy can be helpful in treating GAD by providing support, education, and guidance. A form of talk therapy called cognitive behavioral therapy can teach you healthy ways to think about and react to daily life events and activities.  Stress managementtechniques. These include yoga, meditation, and exercise and can be very helpful when they are practiced regularly. A mental health specialist can help determine which treatment is best for you. Some people see improvement with one therapy. However, other people require a combination of therapies. Document Released: 04/07/2013 Document Revised: 04/27/2014 Document Reviewed: 04/07/2013 ExitCare Patient Information 2015 [employer]. This information is not intended to replace advice given to you by your health care provider. Make sure you discuss any questions you have with your health care provider.  

## 2014-10-11 NOTE — Progress Notes (Signed)
Pt scheduled for stress test tomorrow by Dr Meda Coffee. Pt states he is not going to stay till tomorrow. Dr Meda Coffee made aware. Dr Mike Craze service made aware that pt is signing out [place].

## 2014-10-11 NOTE — H&P (Signed)
Late entry.  Seen and examined yesterday.  Agree with Dr. Darene Lamer.  Patient with known CAD presents with substernal chest and epigastric pain.  A little different from his previous cardiac pain.  I think it is judicious to investigate both GI and cardiac causes.  Because of his known CAD, will involve cards.

## 2014-10-11 NOTE — Progress Notes (Signed)
Seen and examined.  Agree with the documentation and management of Dr. Awanda Mink.  Patient strongly wants to go home today and complete [facility]U as outpatient.  Cards is seeing and we will coordinate with them.  He also has gallstones which may be contributing to his pain.

## 2014-10-11 NOTE — Progress Notes (Signed)
[facility] Admission History and Physical Service Pager: [phone]  Patient name: Chad [name] Medical record number: [id number] Date of birth: [birth year] Age: 53 y.o. Gender: male  Primary Care Provider: Christa See, MD Consultants: Cardiology Code Status: Full code  Chief Complaint: chest pain  Assessment and Plan: Garland [place] is a 53 y.o. male presenting with chest pain . 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 Afebrile, VSS.  CXR with stable enlarged descending thoracic aorta. BNP 1002 from 589 and 11/2012 echo LV EF 35-40% but no signs of fluid overload on exam. Very significant hx with dissection.   - CT angio chest/abd/pelvis with reported anaphylaxis to contrast but prior studies have been done with contrast. Pre-study prednisone protocol via radiology (prednisone 50mg  13 hr, 7hr, and 1 hr prior to CTA, benadryl 50mg  1 hr prior to CTA) ordered.  At this point, pressure stable B/L arm and likelihood of Aortic dissection is < 1 % [facility] cardiology consulted and called. Appreciate recs. - troponin neg x 3, EKG AM nml  - Bed rest, NPO pending CT angiogram. - Continued plavix, coreg (decreased to 12.5mg BID), zetia. Restart norvasc, nitro and lasix.  Switch Imdur to once daily? As is ER pill  - F/U Lipid and A1C   #Abdominal pain Resolved. H/o aortic dissection with graft. H/o MI. No throbbing abdominal mass. Transaminases and lipase WNL. A/w loose stool. Possibly viral gastro vs anxiety.  - Home tylenol and zofran PRN. - NPO pending Abd [place] - CTangio abd/pelvis/chest. Renal fcn at baseline.  #CKD Stage II-III.  Baseline creatinine around 1.2-1.7.   - Currently at baseline, monitor closely - Avoid nephrotoxic drugs if possible.  F/U creatinine after dye  load 2/2 study   #CHF Stable appearing on exam.  - Restarting Lasix/KDur today   #Hx of Hurthle Cell CA of thyroid - Could be cause of previous pancytopenia.  Consider further w/u if fatigue/dysphagia   FEN/GI: NPO; 1/2NS at 50cc/hr. Prophylaxis: SQ heparin  Disposition: Pending further improvement   Subjective:  Doing well, CP resolved, wants to eat  Objective: BP 120/68  Pulse 72  Temp(Src) 97.7 F (36.5 C) (Oral)  Resp 17  Ht 5\' 11"  (1.803 m)  Wt 160 lb 11.2 oz (72.893 kg)  BMI 22.42 kg/m2  SpO2 98% Exam: General: NAD HEENT: AT/ Cardiovascular: RRR, no m/r/g, 2+ B DP pulses and radial pulses. CHEST: No reproducibility of chest pain symptoms Respiratory: CTAB, normal effort Abdomen: S/ND/NT Extremities: No LE edema or calf tenderness Neuro: Awake, alert, no focal deficits  Nolon Rod, DO 10/11/14, 9:29 AM PGY-3, [place] [facility] pager: [phone], text pages welcome

## 2014-10-11 NOTE — Consult Note (Addendum)
CARDIOLOGY CONSULT NOTE   Patient ID: Chad [name] MRN: [id number], DOB/AGE: 51-Dec-1962   Admit date: 10/10/2014 Date of Consult: 10/11/2014  Primary Physician: Christa See, MD Primary Cardiologist: Dr Cristopher Peru  Reason for consult:  Chest pain  Problem List  Past Medical History  Diagnosis Date  . CAD (coronary artery disease)     a. s/p CABG 2006;  b. DES to PDA 2011 (cath: Dx not seen, dRCA/PDA tx with DES; S-PDA occluded (culprit), S-Dx occluded, S-RI and OM ok, L-LAD ok  . HTN (hypertension)     severe  . Chronic systolic heart failure     a. 12/13 ECHO: EF 35-40%, sept, apical & posterobasal HK, LV mod dil & sys fx mod reduced, mild AI, MV mild reg, TV mild reg  . 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 CRI (chronic renal insufficiency)   . Thyroid cancer     H[employer]le Cell  . HLD (hyperlipidemia)   . Anemia   . Gout   . AICD (automatic cardioverter/defibrillator) present   . Carotid stenosis     dopplers 2011: 0-39% bilat.  . Chest pain syndrome     Past Surgical History  Procedure Laterality Date  . Coronary artery bypass graft  06/21/2009    x2  . Cardiac defibrillator placement      [employer]  . Thyroidectomy, partial  06/20/11  . Lumbar disc surgery      x 2, most recent within 5-10 years  . Status post emergency repair of a type a ascending aortic dissection with a hemiarch reconstruction of the ascending aorta  using a 28-mm hemashield graft with redo sternotomy and revision of previous bypass grafts in june 2010.    . Median sternotomy,cabg x 7  06/29/2005    Allergies  Allergies  Allergen Reactions  . Iohexol Anaphylaxis and Other (See Comments)     Desc: PT HAS ANAPHYLAXIS WITH CONTRAST MEDIA!, Onset Date: 16606301   Code: SOB, Desc: ok w/ 13hr prep//a.c., Onset Date: 60109323   . Lipitor [Atorvastatin Calcium] Anaphylaxis and Other (See Comments)    Large doses  . Sulfonamide Derivatives  Shortness Of Breath  . Latex Other (See Comments)    With long periods of exposure REACTION: Reaction not known  . Zocor [Simvastatin] Other (See Comments)    Muscle cramps    HPI  Chad [name] is a 53 year old male with h/o CAD, s/p CABG 2006;  b. DES to PDA 2011 (cath: Dx not seen, dRCA/PDA tx with DES; S-PDA occluded (culprit), S-Dx occluded, S-RI and OM ok, L-LAD ok), h/o ascending aortic dissection, s/p repair in 2010 with residual ascending aortic dissection and abdominal aortic dissection, chronic systolic CHF with LVEF 55% with stable NYHA 2A in April 2015, s/p ICD placement  who presented with back pain, originating in his lower thoracic and upper lumbar spine, also generalized abdominal pain including epigastrium and the pain is shooting down his both legs. He reports that this morning he woke to feeling abdominal pan that was right below his heart, going into his back and neck. He was nauseated and because of fear, he was dyspneic. He took NTG 4mg  and checked his BP at 130/79. He then sat on the toilet and had nausea, diarrhea, and chest wall pain that scared him. When he rechecked BP minutes later, it was 160s/105. This really worried him to he came tot he ED. His prior cardiac emergency occurred with dyspnea, pain  between shoulders, and intense pressure. This felt different. He denies fevers, chills, sick contacts, blood in stool (though he is not really sure), NSAID use, vomiting, or fainting. He had been dizzy though. He never misses medications. He did not take AM meds except carvedilol , hydralazine, and imdur. He feels okay now. Prior to this episode this morning, he felt in his usual state of health. Denies trauma or any new leg swelling.   Inpatient Medications  . amLODipine  5 mg Oral BID  . carvedilol  12.5 mg Oral BID WC  . clopidogrel  75 mg Oral Daily  . ezetimibe  10 mg Oral Daily  . heparin  5,000 Units Subcutaneous 3 times per day  . Influenza vac split quadrivalent  PF  0.5 mL Intramuscular Tomorrow-1000  . isosorbide mononitrate  30 mg Oral Daily  . potassium chloride SA  20 mEq Oral BID    Family History Family History  Problem Relation Age of Onset  . Coronary artery disease    . Hypertension Father   . Heart disease Father   . Early death Father   . COPD Father   . Hypertension Mother      Social History History   Social History  . Marital Status: Divorced    Spouse Name: N/A    Number of Children: N/A  . Years of Education: N/A   Occupational History  . disabled    Social History Main Topics  . Smoking status: Never Smoker   . Smokeless tobacco: Never Used  . Alcohol Use: No  . Drug Use: No  . Sexual Activity: Yes   Other Topics Concern  . Not on file   Social History Narrative   Engaged and lives with fiancee and 4 kids.    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      Review of Systems  General:  No chills, fever, night sweats or weight changes.  Cardiovascular:  No chest pain, dyspnea on exertion, edema, orthopnea, palpitations, paroxysmal nocturnal dyspnea. Dermatological: No rash, lesions/masses Respiratory: No cough, dyspnea Urologic: No hematuria, dysuria Abdominal:   No nausea, vomiting, diarrhea, bright red blood per rectum, melena, or hematemesis Neurologic:  No visual changes, wkns, changes in mental status. All other systems reviewed and are otherwise negative except as noted above.  Physical Exam  Blood pressure 129/71, pulse 64, temperature 97.7 F (36.5 C), temperature source Oral, resp. rate 16, height 5\' 11"  (1.803 m), weight 162 lb 1.6 oz (73.528 kg), SpO2 98.00%.  General: Pleasant, in mild acute distress Psych: Normal affect. Neuro: Alert and oriented X 3. Moves all extremities spontaneously. HEENT: Normal  Neck: Supple without bruits or JVD. Lungs:  Resp regular and unlabored, CTA. Heart: RRR no s3, s4, 2/6 systolic murmur. Abdomen: Soft, non-tender, non-distended, BS + x 4.    Extremities: No clubbing, cyanosis or edema. DP/PT/Radials 2+ and equal bilaterally.  Labs   Recent Labs  10/10/14 1125 10/10/14 1650 10/10/14 1845 10/10/14 2125  TROPONINI <0.30 <0.30 <0.30 <0.30   Lab Results  Component Value Date   WBC 3.1* 10/11/2014   HGB 12.9* 10/11/2014   HCT 39.4 10/11/2014   MCV 81.4 10/11/2014   PLT 141* 10/11/2014    Recent Labs Lab 10/10/14 0637  10/11/14 0340  NA 140  --  140  K 3.7  --  4.5  CL 104  --  104  CO2 23  --  26  BUN 21  --  19  CREATININE 1.41*  < >  1.50*  CALCIUM 8.5  --  9.2  PROT 6.9  --   --   BILITOT 0.4  --   --   ALKPHOS 48  --   --   ALT 9  --   --   AST 13  --   --   GLUCOSE 109*  --  138*  < > = values in this interval not displayed. Lab Results  Component Value Date   CHOL 159 10/11/2014   HDL 57 10/11/2014   LDLCALC 92 10/11/2014   TRIG 52 10/11/2014   Lab Results  Component Value Date   DDIMER 0.52* 01/16/2013   Radiology/Studies  Dg Chest 2 View  10/10/2014   CLINICAL DATA:  53 year old with mid left abdominal pain and nausea. Patient also felt chest discomfort and felt like he was having a heart attack.  EXAM: CHEST  2 VIEW  COMPARISON:  03/11/2014  FINDINGS: There is stable enlargement of the proximal descending thoracic aorta. Left cardiac ICD is in a stable position. The lungs are clear without airspace disease or pulmonary edema. Postsurgical changes compatible with a CABG. Few linear densities at left lung base could represent mild atelectasis. No pleural effusions.  IMPRESSION: No acute chest abnormality.  Stable enlargement of the proximal descending thoracic aorta.   Electronically Signed   By: Markus Daft M.D.   On: 10/10/2014 07:47    CTA CHEST FINDINGS   Patient has a surgical graft involving the ascending thoracic aorta  which is widely patent. The aortic root proximal to the graft  measures 4.3 cm and unchanged. Patient had a coronary bypass  procedure. Again noted is an aortic  dissection at the aortic arch  just beyond the surgical graft. The configuration of this dissection  is unchanged. A dissection does not extend into the great vessels  and there is flow in the right innominate artery, left subclavian  artery and left common carotid artery. Aortic arch measures 4.0 cm  in diameter and unchanged. Proximal descending thoracic aorta  measures 4.5 cm and minimally changed. The dissection does extend  down the thoracic aorta. The true lumen appears to be along the  anterior aspect of the aorta. Again noted is an enlarged heart with  calcifications in the native coronary arteries. There is stable  low-density material and calcifications the mediastinum. Overall, no  significant chest lymphadenopathy. There appears to be absent left  thyroid tissue which is unchanged. Pulmonary arteries are not  opacified on this examination.  The trachea and mainstem bronchi are patent. Bibasilar dependent  densities are most compatible with atelectasis. There is no  significant airspace disease or pulmonary edema. No acute bone  abnormality.  Review of the MIP images confirms the above findings.   CTA ABDOMEN AND PELVIS FINDINGS  The aortic dissection extends into the abdominal aorta and  terminates in the distal common iliac arteries bilaterally. The  abdominal aorta at the level of the celiac trunk measures 4.0 cm and  unchanged from 2014 but measured 2.7 cm in 2010. The distal  abdominal aorta measures up to 2.8 cm in AP dimension and previously  measured 2.1 cm in 2010. Dissection involves the proximal celiac  trunk. The dissection involves the proximal SMA and similar to the  previous examination. The main SMA branches are patent. IMA  originates from the true lumen. Right renal artery originates from  the true lumen. Left renal artery originates from the true lumen.  The internal and external iliac arteries are  patent bilaterally.  Proximal femoral arteries are patent  bilaterally.  Negative for free air. High-density material in the gallbladder is  consistent with stones or sludge. No acute abnormality to the liver,  spleen, pancreas and adrenal glands. Again noted is a chronically  atrophic left kidney. Chronic areas of scarring along the right  kidney lower pole. Small low-density structures in the right kidney  likely represent small cysts. No gross abnormality to the prostate  or urinary bladder. Small amount of free fluid in the pelvis. Large  amount of stool in the colon. Low-density collection just right of  the right inguinal canal. This area roughly measures 3.0 x 2.2 cm  and this could be associated with a right femoral hernia. No acute  bone abnormality. Severe degenerative disc disease in lower lumbar  spine.  Review of the MIP images confirms the above findings.   IMPRESSION:  The ascending thoracic aortic graft remains patent and stable  configuration of the aortic dissection distal to the aortic graft.  The dissection involves aortic arch and extends down to the common  iliac arteries.  The size of the upper abdominal aorta has not significantly changed  since 2014 but there has been clear growth since 2010.  Small amount of free fluid in the pelvis and a small amount of fluid  in the right groin. The right groin fluid could be within a right  femoral hernia. Etiology for the intra-abdominal free fluid is  nonspecific but could be related to an inflammatory process process  such as enteritis.  Question gallstones or gallbladder sludge. No evidence for acute  gallbladder inflammation.  Electronically Signed  By: Markus Daft M.D.  Echocardiogram - 11/26/2012 Study Conclusions  - Left ventricle: Septal apical and posterobasal hypokinesis The cavity size was moderately dilated. Wall thickness was increased in a pattern of mild LVH. Systolic function was moderately reduced. The estimated ejection fraction was in the range of 35% to  40%. - Aortic valve: Mild regurgitation. - Mitral valve: Mild regurgitation. - Left atrium: The atrium was moderately dilated. - Atrial septum: No defect or patent foramen ovale was identified.  ECG: SR, LVH with repolarization abnormalities in the inferolateral leads, unchanged from prior ECG in June 2015    ASSESSMENT AND PLAN  53 year old male with h/o CAD, s/p CABG 2006;  S/p asneding aortic repair, who presented with back, chest, abdominal and leg pain  1. Back pain/Chest pain - atypical, however no ischemic work up since 2006, we will schedule a Lexiscan nuclear stress test for tomorrow, exercise stress test is contraindication with ascending aortic dissection (stable on CT today)  2. Back/Abdominal pain - the patient has h/o the ascending thoracic aortic graft that remains patent and stable  But the aortic dissection distal to the aortic graft involves aortic arch and extends down to the common  iliac arteries. The size of the upper abdominal aorta has not significantly changed since 2014 but there has been clear growth since 2010.  CT findings stable however he seems to be in significant pain - possible ischemic bowel, enteritis should be excluded.   3. Chronic systolic CHF - appears euvolemic, LVEF 35-40%, continue carvedilol, no ACEI sec to CKD stage 3 No echo since 2013, we will order  4. HTN - controlled, he need very tight control with aortic dissection  5. CKD - slightly worse from baseline, will need hydration post contrast  Signed, Dorothy Spark, MD, [facility] 10/11/2014, 11:00 AM

## 2014-10-11 NOTE — Discharge Summary (Signed)
Physician Discharge Summary  Patient ID: Chad [name] MRN: [id number] DOB: 07/17/1961 Age: 53 y.o.  Admit date: 10/10/2014 Discharge date: 10/11/2014 Admitting Physician: Zigmund Gottron, MD  PCP: Christa See, MD  Consultants:Cardiology      Discharge Diagnosis: Active Problems:   HYPERTENSION, BENIGN   CAD, ARTERY BYPASS GRAFT   Aortic dissection, thoracoabdominal   Thyroid cancer, minimally invasive Hurthle cell carcinoma   Acute on chronic systolic and diastolic heart failure, NYHA class 3   Chest pain    Hospital Course Chad [name] is a 53 y.o. male who presented with atypical angina . PMH is significant for CHF, systolic and diastolic; h/o thoracoabdominal aortic dissection; CAD, artery bypass graft; CKD, HLD, HTN, minimally invasive hurthle cell carcinoma of thyroid, h/o MI.  #Chest pressure/pain - Pt was admitted to the hospital for substernal CP/abdominal pain duration of about 12-24 hrs.  He was admitted to telemetry, had EKG performed which was unchanged from baseline, troponin negative x 3.  Due to his hx of an aortic dissection, a CT angiogram was performed of his chest abdominal and pelvis.  This showed a stable graft with no new evidence of any acute pathology.  At this point, cardiology was consulted to evaluate for possible testing and they recommended a myoview lexiscan due to his Sx.  However, pt did not want to remain inpatient for the procedure and cardiology felt that this had to be performed inpt.  Pt decided to leave AMA at this point, after multiple discussions about the risks/benefits of the procedure and leaving AMA.    #Abdominal Pain - Pt had epigastric/RUQ pain upon presentation concerning for cardiac in origin or possible GB.  A CMET did not show transaminitis and lipase was WNL.  He had [place] of RUQ showing gallstones without acute inflammation.  Sx resolved prior to leaving.    #Chronic Systolic/Diastolic CHF - Pt appeared to be  euvolemic during his hospital stay.  Continued his home medications.  #CKD Stage II-III - Creatinine was at baseline during his hospital stay.  Determined not to be good candidate for ACE for this reason.   #HTN - Stable, continued home meds  #CAD - Stable, continued home meds    Procedures/Imaging:   US Abdomen Complete  10/11/2014   .  IMPRESSION: Cholelithiasis without biliary dilatation. Reportedly, the patient is tender over the gallbladder. These findings are equivocal for acute cholecystitis.  Known abdominal aortic dissection.   Electronically Signed   By: Markus Daft M.D.   On: 10/11/2014 11:08   Ct Angio Abd/pel W/ And/or W/o  10/11/2014 IMPRESSION: The ascending thoracic aortic graft remains patent and stable configuration of the aortic dissection distal to the aortic graft. The dissection involves aortic arch and extends down to the common iliac arteries.  The size of the upper abdominal aorta has not significantly changed since 2014 but there has been clear growth since 2010.  Small amount of free fluid in the pelvis and a small amount of fluid in the right groin. The right groin fluid could be within a right femoral hernia. Etiology for the intra-abdominal free fluid is nonspecific but could be related to an inflammatory process process such as enteritis.  Question gallstones or gallbladder sludge. No evidence for acute gallbladder inflammation.   Electronically Signed   By: Markus Daft M.D.   On: 10/11/2014 11:00   Labs  CBC  Recent Labs Lab 10/10/14 0637 10/10/14 1650 10/11/14 0340  WBC 3.0* 3.0* 3.1*  HGB 11.9* 12.7*  12.9*  HCT 36.4* 37.9* 39.4  PLT 133* 136* 141*   BMET  Recent Labs Lab 10/10/14 0637 10/10/14 1650 10/11/14 0340  NA 140  --  140  K 3.7  --  4.5  CL 104  --  104  CO2 23  --  26  BUN 21  --  19  CREATININE 1.41* 1.40* 1.50*  CALCIUM 8.5  --  9.2  PROT 6.9  --   --   BILITOT 0.4  --   --   ALKPHOS 48  --   --   ALT 9  --   --   AST 13  --    --   GLUCOSE 109*  --  138*   LIPID PANEL     Status: None   Collection Time    10/11/14  3:40 AM      Result Value Ref Range   Cholesterol 159  0 - 200 mg/dL   Triglycerides 52  <150 mg/dL   HDL 57  >39 mg/dL   Total CHOL/HDL Ratio 2.8     VLDL 10  0 - 40 mg/dL   LDL Cholesterol 92  0 - 99 mg/dL       Patient condition at time of discharge/disposition: stable  Disposition-home, left AMA    Follow up issues: 1. Left AMA after CP.  Cardiology evaluated and recommend Lexiscan myoview to be done inpt and pt did not want to wait 2.  Consider switching Imdur (ER tablet) to once daily  3.  BP management and cardiology f/u in outpt setting  4.  + for Cholelithiasis on [place] and CT scan w/ some biliary colic.  Could warrant surgery referral in outpt setting 5. Anxiety component to this, could benefit from psychiatry/evaluation   Discharge follow up:  Follow-up Information   Follow up with Street, Harrell Gave, MD. Schedule an appointment as soon as possible for a visit in 1 week.   Specialty:  Family Medicine   Contact information:   [place] [address] [phone]       Discharge Instructions: Please refer to Patient Instructions section of EMR for full details.  Patient was counseled important signs and symptoms that should prompt return to medical care, changes in medications, dietary instructions, activity restrictions, and follow up appointments.  Significant instructions noted below:    Discharge Medications   Medication List         amLODipine 5 MG tablet  Commonly known as:  NORVASC  Take 1 tablet (5 mg total) by mouth 2 (two) times daily.     carvedilol 25 MG tablet  Commonly known as:  COREG  Take 1 tablet (25 mg total) by mouth 2 (two) times daily with a meal.     clopidogrel 75 MG tablet  Commonly known as:  PLAVIX  Take 1 tablet (75 mg total) by mouth daily.     cyclobenzaprine 10 MG tablet  Commonly known as:  FLEXERIL  Take  10 mg by mouth daily as needed for muscle spasms.     docusate sodium 100 MG capsule  Commonly known as:  COLACE  Take 100 mg by mouth as needed for mild constipation.     ezetimibe 10 MG tablet  Commonly known as:  ZETIA  Take 1 tablet (10 mg total) by mouth daily.     furosemide 40 MG tablet  Commonly known as:  LASIX  Take 1 tablet (40 mg total) by mouth daily as needed for  fluid.     hydrALAZINE 25 MG tablet  Commonly known as:  APRESOLINE  Take 3 tablets (75 mg total) by mouth 3 (three) times daily.     isosorbide mononitrate 30 MG 24 hr tablet  Commonly known as:  IMDUR  Take 30 mg by mouth 2 (two) times daily.     loratadine 10 MG tablet  Commonly known as:  CLARITIN  Take 1 tablet (10 mg total) by mouth daily as needed for allergies.     nitroGLYCERIN 0.4 MG SL tablet  Commonly known as:  NITROSTAT  Place 0.4 mg under the tongue every 5 (five) minutes as needed for chest pain.     OVER THE COUNTER MEDICATION  Take 1 capsule by mouth daily. "Mega Men 50 Plus"     oxycodone 30 MG immediate release tablet  Commonly known as:  ROXICODONE  Take 30 mg by mouth every 8 (eight) hours as needed for pain.     potassium chloride SA 20 MEQ tablet  Commonly known as:  K-DUR,KLOR-CON  Take 20 mEq by mouth 2 (two) times daily.         Kennith Maes, DO of [facility] 10/11/2014 3:11 PM

## 2014-10-12 NOTE — Discharge Summary (Signed)
Seen and examined on the day of DC.  Agree with the documentation and management of Dr. Awanda Mink.

## 2014-10-12 NOTE — ED Provider Notes (Signed)
Medical screening examination/treatment/procedure(s) were conducted as a shared visit with non-physician practitioner(s) and myself.  I personally evaluated the patient during the encounter.   EKG Interpretation   Date/Time:  Saturday October 10 2014 06:53:01 EDT Ventricular Rate:  68 PR Interval:  198 QRS Duration: 101 QT Interval:  424 QTC Calculation: 451 R Axis:   -5 Text Interpretation:  Sinus rhythm Left atrial enlargement LVH with  secondary repolarization abnormality No significant change was found  Confirmed by [facility]  MD, TREY ([id number]) on 10/10/2014 11:12:56 AM      Presents with chest pain and abdominal pain prior to presentation. Chest pain is now resolved but continues to have left upper quadrant abdominal pain. Bilateral pulses equal in all extremities. Abdomen soft with mild left upper quadrant tenderness. Chest x-ray stable. Patient admitted for CT angiogram an MI rule out.  Julianne Rice, MD 10/12/14 [phone]

## 2014-11-06 ENCOUNTER — Other Ambulatory Visit: Payer: Self-pay | Admitting: Cardiology

## 2014-11-06 DIAGNOSIS — R079 Chest pain, unspecified: Secondary | ICD-10-CM

## 2014-11-13 ENCOUNTER — Encounter: Payer: Self-pay | Admitting: Internal Medicine

## 2014-11-16 ENCOUNTER — Ambulatory Visit: Payer: Medicaid Other

## 2014-11-17 ENCOUNTER — Encounter (HOSPITAL_COMMUNITY): Payer: Medicaid Other

## 2014-11-23 ENCOUNTER — Ambulatory Visit: Payer: Medicaid Other

## 2014-11-23 ENCOUNTER — Telehealth (HOSPITAL_COMMUNITY): Payer: Self-pay | Admitting: Vascular Surgery

## 2014-11-23 NOTE — Telephone Encounter (Signed)
Ok

## 2014-11-23 NOTE — Telephone Encounter (Signed)
Webb Silversmith called pt is being D.C from tele- monitoring due to medicaid being D/C... Webb Silversmith said give her a call if you have any questions

## 2014-11-24 ENCOUNTER — Encounter (HOSPITAL_COMMUNITY): Payer: Medicaid Other

## 2014-12-01 ENCOUNTER — Ambulatory Visit (HOSPITAL_COMMUNITY): Payer: Medicaid Other | Attending: Cardiology | Admitting: Radiology

## 2014-12-01 VITALS — BP 126/79 | HR 60 | Ht 71.0 in | Wt 158.0 lb

## 2014-12-01 DIAGNOSIS — R079 Chest pain, unspecified: Secondary | ICD-10-CM | POA: Diagnosis present

## 2014-12-01 DIAGNOSIS — R070 Pain in throat: Secondary | ICD-10-CM | POA: Diagnosis not present

## 2014-12-01 DIAGNOSIS — R1010 Upper abdominal pain, unspecified: Secondary | ICD-10-CM | POA: Insufficient documentation

## 2014-12-01 MED ORDER — AMINOPHYLLINE 25 MG/ML IV SOLN
75.0000 mg | Freq: Once | INTRAVENOUS | Status: AC
  Administered 2014-12-01: 75 mg via INTRAVENOUS

## 2014-12-01 MED ORDER — TECHNETIUM TC 99M SESTAMIBI GENERIC - CARDIOLITE
33.0000 | Freq: Once | INTRAVENOUS | Status: AC | PRN
  Administered 2014-12-01: 33 via INTRAVENOUS

## 2014-12-01 MED ORDER — TECHNETIUM TC 99M SESTAMIBI GENERIC - CARDIOLITE
11.0000 | Freq: Once | INTRAVENOUS | Status: AC | PRN
  Administered 2014-12-01: 11 via INTRAVENOUS

## 2014-12-01 MED ORDER — REGADENOSON 0.4 MG/5ML IV SOLN
0.4000 mg | Freq: Once | INTRAVENOUS | Status: AC
  Administered 2014-12-01: 0.4 mg via INTRAVENOUS

## 2014-12-01 NOTE — Progress Notes (Signed)
[facility] [address] [place], [place] [address] [phone]    Cardiology Nuclear Med Chad [name] is a 53 y.o. male     MRN : [id number]     DOB: 06-18-1961  Procedure Date: 12/01/2014  Nuclear Med Background Indication for Stress Test:  Evaluation for Ischemia, Graft Patency and Stent Patency History:  CAD, MPI 2012 (scar) EF 35%, AICD, Asthma Cardiac Risk Factors: Carotid Disease and Hypertension  Symptoms:  Chest Pain, Dizziness and SOB   Nuclear Pre-Procedure Caffeine/Decaff Intake:  None> 12 hrs NPO After: 7:00pm   Lungs:  clear O2 Sat: 97% on room air. IV 0.9% NS with Angio Cath:  20g  IV Site: R Antecubital x 1, tolerated well IV Started by:  Irven Baltimore, RN  Chest Size (in):  40 Cup Size: n/a  Height: 5\' 11"  (1.803 m)  Weight:  158 lb (71.668 kg)  BMI:  Body mass index is 22.05 kg/(m^2). Tech Comments:  Patient took Coreg this am. Irven Baltimore, RN.    Nuclear Med Study 1 or 2 day study: 1 day  Stress Test Type:  Carlton Adam  Reading MD: N/A  Order Authorizing Provider:  Cristopher Peru, MD  Resting Radionuclide: Technetium 44m Sestamibi  Resting Radionuclide Dose: 11.0 mCi   Stress Radionuclide:  Technetium 11m Sestamibi  Stress Radionuclide Dose: 33.0 mCi           Stress Protocol Rest HR: 60 Stress HR: 81  Rest BP: 126/79 Stress BP: 118/79  Exercise Time (min): n/a METS: n/a   Predicted Max HR: 169 bpm % Max HR: 47.93 bpm Rate Pressure Product: 10206   Dose of Adenosine (mg):  n/a Dose of Lexiscan: 0.4 mg  Dose of Atropine (mg): n/a Dose of Dobutamine: n/a mcg/kg/min (at max HR)  Stress Test Technologist: Glade Lloyd, BS-ES  Nuclear Technologist:  Earl Many, CNMT     Rest Procedure:  Myocardial perfusion imaging was performed at rest 45 minutes following the intravenous administration of Technetium 36m Sestamibi. Rest ECG: Sinus rhythms, 60 with left ventricle hypertrophy, marked T-wave inversion  inferior, lateral leads.  Stress Procedure:  The patient received IV Lexiscan 0.4 mg over 15-seconds.  Technetium 48m Sestamibi injected at 30-seconds.  Quantitative spect images were obtained after a 45 minute delay.  During the infusion of Lexiscan the patient complained of SOB, neck pain, throat tightness, headache and stomach pain.  The throat and stomach discomfort did not resolved so 75 mg aminophylline was administered.  He began to feel much better within a couple of minutes.  Stress ECG: No significant change from baseline ECG  QPS Raw Data Images:  Normal; no motion artifact; normal heart/lung ratio. Stress Images:  Dilated ventricle noted. There is a fixed defect, large in size, moderate in severity involving the inferior wall seen at both rest and stress from base to apex representative of old infarct. Otherwise, homogeneous radiotracer uptake. Bowel loop attenuation noted during rest images. Rest Images:  As above. Subtraction (SDS):  No evidence of ischemia. Transient Ischemic Dilatation (Normal <1.22):  1.03 Lung/Heart Ratio (Normal <0.45):  0.32  Quantitative Gated Spect Images QGS EDV:  347 ml QGS ESV:  244 ml  Impression Exercise Capacity:  Lexiscan with no exercise. BP Response:  Normal blood pressure response. Clinical Symptoms:  Typical symptoms with Lexiscan, dyspnea, neck pain, throat tightness, nausea, headache. ECG Impression:  No significant ST segment change suggestive of ischemia. Comparison with Prior Nuclear Study: No images to  compare  Overall Impression:  High risk stress nuclear study Secondary to ejection fraction of 30% with large inferior scar. However, no ischemia is identified..  LV Ejection Fraction: 30%.  LV Wall Motion:  Inferior wall hypokinesis. Markedly dilated left ventricle, left ventricular end*diastolic volume 035 mL  Candee Furbish, MD

## 2014-12-03 ENCOUNTER — Encounter (HOSPITAL_COMMUNITY): Payer: Self-pay | Admitting: Internal Medicine

## 2014-12-04 ENCOUNTER — Other Ambulatory Visit (HOSPITAL_COMMUNITY): Payer: Self-pay | Admitting: *Deleted

## 2014-12-04 MED ORDER — NITROGLYCERIN 0.4 MG SL SUBL: 0.4000 mg | SUBLINGUAL_TABLET | SUBLINGUAL | Status: DC | PRN

## 2014-12-14 ENCOUNTER — Ambulatory Visit (INDEPENDENT_AMBULATORY_CARE_PROVIDER_SITE_OTHER): Payer: Medicaid Other | Admitting: *Deleted

## 2014-12-14 ENCOUNTER — Ambulatory Visit: Payer: Medicaid Other | Admitting: *Deleted

## 2014-12-14 ENCOUNTER — Encounter: Payer: Self-pay | Admitting: *Deleted

## 2014-12-14 DIAGNOSIS — Z23 Encounter for immunization: Secondary | ICD-10-CM

## 2015-01-11 ENCOUNTER — Encounter (HOSPITAL_COMMUNITY): Payer: Self-pay | Admitting: Emergency Medicine

## 2015-01-11 ENCOUNTER — Emergency Department (HOSPITAL_COMMUNITY)
Admission: EM | Admit: 2015-01-11 | Discharge: 2015-01-11 | Disposition: A | Discharge status: Home / Self Care | Payer: Medicaid Other | Attending: Emergency Medicine | Admitting: Emergency Medicine

## 2015-01-11 DIAGNOSIS — Z8739 Personal history of other diseases of the musculoskeletal system and connective tissue: Secondary | ICD-10-CM | POA: Diagnosis not present

## 2015-01-11 DIAGNOSIS — N189 Chronic kidney disease, unspecified: Secondary | ICD-10-CM | POA: Insufficient documentation

## 2015-01-11 DIAGNOSIS — Z85850 Personal history of malignant neoplasm of thyroid: Secondary | ICD-10-CM | POA: Diagnosis not present

## 2015-01-11 DIAGNOSIS — I2510 Atherosclerotic heart disease of native coronary artery without angina pectoris: Secondary | ICD-10-CM | POA: Insufficient documentation

## 2015-01-11 DIAGNOSIS — R1031 Right lower quadrant pain: Secondary | ICD-10-CM | POA: Insufficient documentation

## 2015-01-11 DIAGNOSIS — Z79899 Other long term (current) drug therapy: Secondary | ICD-10-CM | POA: Insufficient documentation

## 2015-01-11 DIAGNOSIS — Z91040 Latex allergy status: Secondary | ICD-10-CM | POA: Diagnosis not present

## 2015-01-11 DIAGNOSIS — E785 Hyperlipidemia, unspecified: Secondary | ICD-10-CM | POA: Diagnosis not present

## 2015-01-11 DIAGNOSIS — Z862 Personal history of diseases of the blood and blood-forming organs and certain disorders involving the immune mechanism: Secondary | ICD-10-CM | POA: Insufficient documentation

## 2015-01-11 DIAGNOSIS — I129 Hypertensive chronic kidney disease with stage 1 through stage 4 chronic kidney disease, or unspecified chronic kidney disease: Secondary | ICD-10-CM | POA: Insufficient documentation

## 2015-01-11 DIAGNOSIS — Z951 Presence of aortocoronary bypass graft: Secondary | ICD-10-CM | POA: Insufficient documentation

## 2015-01-11 DIAGNOSIS — D1721 Benign lipomatous neoplasm of skin and subcutaneous tissue of right arm: Secondary | ICD-10-CM | POA: Insufficient documentation

## 2015-01-11 DIAGNOSIS — D1720 Benign lipomatous neoplasm of skin and subcutaneous tissue of unspecified limb: Secondary | ICD-10-CM

## 2015-01-11 DIAGNOSIS — I5022 Chronic systolic (congestive) heart failure: Secondary | ICD-10-CM | POA: Insufficient documentation

## 2015-01-11 DIAGNOSIS — Z7902 Long term (current) use of antithrombotics/antiplatelets: Secondary | ICD-10-CM | POA: Diagnosis not present

## 2015-01-11 DIAGNOSIS — R599 Enlarged lymph nodes, unspecified: Secondary | ICD-10-CM | POA: Diagnosis present

## 2015-01-11 LAB — BASIC METABOLIC PANEL
ANION GAP: 7 (ref 5–15)
BUN: 15 mg/dL (ref 6–23)
CHLORIDE: 105 meq/L (ref 96–112)
CO2: 27 mmol/L (ref 19–32)
Calcium: 8.8 mg/dL (ref 8.4–10.5)
Creatinine, Ser: 1.42 mg/dL — ABNORMAL HIGH (ref 0.50–1.35)
GFR calc Af Amer: 64 mL/min — ABNORMAL LOW (ref >90)
GFR calc non Af Amer: 55 mL/min — ABNORMAL LOW (ref >90)
Glucose, Bld: 96 mg/dL (ref 70–99)
POTASSIUM: 4.1 mmol/L (ref 3.5–5.1)
SODIUM: 139 mmol/L (ref 135–145)

## 2015-01-11 LAB — CBC WITH DIFFERENTIAL/PLATELET
BASOS ABS: 0 10*3/uL (ref 0.0–0.1)
Basophils Relative: 0 % (ref 0–1)
EOS ABS: 0.1 10*3/uL (ref 0.0–0.7)
Eosinophils Relative: 4 % (ref 0–5)
HEMATOCRIT: 38 % — AB (ref 39.0–52.0)
Hemoglobin: 12.7 g/dL — ABNORMAL LOW (ref 13.0–17.0)
LYMPHS ABS: 1.5 10*3/uL (ref 0.7–4.0)
LYMPHS PCT: 46 % (ref 12–46)
MCH: 27.4 pg (ref 26.0–34.0)
MCHC: 33.4 g/dL (ref 30.0–36.0)
MCV: 82.1 fL (ref 78.0–100.0)
MONOS PCT: 9 % (ref 3–12)
Monocytes Absolute: 0.3 10*3/uL (ref 0.1–1.0)
NEUTROS PCT: 41 % — AB (ref 43–77)
Neutro Abs: 1.4 10*3/uL — ABNORMAL LOW (ref 1.7–7.7)
Platelets: 121 10*3/uL — ABNORMAL LOW (ref 150–400)
RBC: 4.63 MIL/uL (ref 4.22–5.81)
RDW: 13.8 % (ref 11.5–15.5)
WBC: 3.3 10*3/uL — ABNORMAL LOW (ref 4.0–10.5)

## 2015-01-11 NOTE — ED Provider Notes (Signed)
CSN: [id number]     Arrival date & time 01/11/15  1708 History  This chart was scribed for non-physician practitioner, Alyse Low, PA-C working with Tanna Furry, MD by Frederich Balding, ED scribe. This patient was seen in room TR11C/TR11C and the patient's care was started at 6:53 PM.   Chief Complaint  Patient presents with  . Adenopathy   The history is provided by the patient. No language interpreter was used.    HPI Comments: Chad [name] is a 54 y.o. male who presents to the Emergency Department complaining of nodules to his right groin area and right forearm that started about 6 months ago. Denies tenderness or redness to the areas. Pt has no concern for STDs. Denies dysuria, axillary lymphadenopathy.   Past Medical History  Diagnosis Date  . CAD (coronary artery disease)     a. s/p CABG 2006;  b. DES to PDA 2011 (cath: Dx not seen, dRCA/PDA tx with DES; S-PDA occluded (culprit), S-Dx occluded, S-RI and OM ok, L-LAD ok  . HTN (hypertension)     severe  . Chronic systolic heart failure     a. 12/13 ECHO: EF 35-40%, sept, apical & posterobasal HK, LV mod dil & sys fx mod reduced, mild AI, MV mild reg, TV mild reg  . 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 CRI (chronic renal insufficiency)   . Thyroid cancer     Hertle Cell  . HLD (hyperlipidemia)   . Anemia   . Gout   . AICD (automatic cardioverter/defibrillator) present   . Carotid stenosis     dopplers 2011: 0-39% bilat.  . Chest pain syndrome    Past Surgical History  Procedure Laterality Date  . Coronary artery bypass graft  06/21/2009    x2  . Cardiac defibrillator placement      [employer]  . Thyroidectomy, partial  06/20/11  . Lumbar disc surgery      x 2, most recent within 5-10 years  . Status post emergency repair of a type a ascending aortic dissection with a hemiarch reconstruction of the ascending aorta  using a 28-mm hemashield graft with redo sternotomy and revision of  previous bypass grafts in june 2010.    . Median sternotomy,cabg x 7  06/29/2005  . Implantable cardioverter defibrillator (icd) generator change N/A 12/13/2012    Procedure: ICD GENERATOR CHANGE;  Surgeon: Evans Lance, MD;  Location[facility];  Service: Cardiovascular;  Laterality: N/A;   Family History  Problem Relation Age of Onset  . Coronary artery disease    . Hypertension Father   . Heart disease Father   . Early death Father   . COPD Father   . Hypertension Mother    History  Substance Use Topics  . Smoking status: Never Smoker   . Smokeless tobacco: Never Used  . Alcohol Use: No    Review of Systems  Genitourinary: Negative for dysuria.  Hematological: Positive for adenopathy.  All other systems reviewed and are negative.  Allergies  Iohexol; Lipitor; Sulfonamide derivatives; Latex; and Zocor  Home Medications   Prior to Admission medications   Medication Sig Start Date End Date Taking? Authorizing Provider  amLODipine (NORVASC) 5 MG tablet Take 1 tablet (5 mg total) by mouth 2 (two) times daily. 06/25/14   Jolaine Artist, MD  carvedilol (COREG) 25 MG tablet Take 1 tablet (25 mg total) by mouth 2 (two) times daily with a meal. 06/25/14  Jolaine Artist, MD  clopidogrel (PLAVIX) 75 MG tablet Take 1 tablet (75 mg total) by mouth daily. 08/10/14   Jolaine Artist, MD  cyclobenzaprine (FLEXERIL) 10 MG tablet Take 10 mg by mouth daily as needed for muscle spasms.     Historical Provider, MD  docusate sodium (COLACE) 100 MG capsule Take 100 mg by mouth as needed for mild constipation.     Historical Provider, MD  ezetimibe (ZETIA) 10 MG tablet Take 1 tablet (10 mg total) by mouth daily. 06/25/14   Jolaine Artist, MD  furosemide (LASIX) 40 MG tablet Take 1 tablet (40 mg total) by mouth daily as needed for fluid. 06/25/14   Jolaine Artist, MD  hydrALAZINE (APRESOLINE) 25 MG tablet Take 3 tablets (75 mg total) by mouth 3 (three) times daily. 06/25/14   Jolaine Artist, MD  isosorbide mononitrate (IMDUR) 30 MG 24 hr tablet Take 30 mg by mouth 2 (two) times daily.    Historical Provider, MD  loratadine (CLARITIN) 10 MG tablet Take 1 tablet (10 mg total) by mouth daily as needed for allergies. 07/29/13   Amy D Ninfa Meeker, NP  nitroGLYCERIN (NITROSTAT) 0.4 MG SL tablet Place 1 tablet (0.4 mg total) under the tongue every 5 (five) minutes as needed for chest pain. 12/04/14   Jolaine Artist, MD  OVER THE COUNTER MEDICATION Take 1 capsule by mouth daily. "Mega Men 50 Plus"    Historical Provider, MD  oxycodone (ROXICODONE) 30 MG immediate release tablet Take 30 mg by mouth every 8 (eight) hours as needed for pain.     Historical Provider, MD  potassium chloride SA (K-DUR,KLOR-CON) 20 MEQ tablet Take 20 mEq by mouth 2 (two) times daily.    Historical Provider, MD   BP 142/79 mmHg  Pulse 68  Temp(Src) 97.8 F (36.6 C) (Oral)  Resp 18  SpO2 100%   Physical Exam  Constitutional: He is oriented to person, place, and time. He appears well-developed and well-nourished. No distress.  HENT:  Head: Normocephalic and atraumatic.  Mouth/Throat: Oropharynx is clear and moist.  Eyes: Conjunctivae and EOM are normal.  Neck: Neck supple. No tracheal deviation present.  Cardiovascular: Normal rate, regular rhythm and normal heart sounds.   Pulmonary/Chest: Effort normal and breath sounds normal. No respiratory distress.  Musculoskeletal: Normal range of motion.  5 mm palpable calcification non tender, does not feel lymphatic.  Lymphadenopathy:  Right groin shotty, diffuse lymphadenopathy. Left groin normal. No throat, axillary or cervical lymphadenopathy.   Neurological: He is alert and oriented to person, place, and time.  Skin: Skin is warm and dry.  Psychiatric: He has a normal mood and affect. His behavior is normal.  Nursing note and vitals reviewed.   ED Course  Procedures (including critical care time)  DIAGNOSTIC STUDIES: Oxygen Saturation is 100% on  RA, normal by my interpretation.    COORDINATION OF CARE: 6:57 PM-Discussed treatment plan which includes blood work with pt at bedside and pt agreed to plan.   Labs Review Labs Reviewed - No data to display  Imaging Review No results found.   EKG Interpretation None      MDM   Final diagnoses:  None   Pt's care turned over to [facility] NP  I personally performed the services described in this documentation, which was scribed in my presence. The recorded information has been reviewed and is accurate.  Fransico Meadow, PA-C 01/11/15 1914  @ 2000 patient awaiting lab results and has  developed chest and back pain. He states feel like when he had an MI previously. EKG ordered. Discussed with Dr Jeneen Rinks and will move the patient to Pod. A for further evaluation.   [place], NP 01/11/15 2002  Tanna Furry, MD 01/16/15 [phone]

## 2015-01-11 NOTE — ED Provider Notes (Addendum)
Patient seen and evaluated. When I walk into the room he states "doc I feel fine and I want to go home". He states that his back feels better after getting up and walking around. He denies any chest pain currently. His EKG shows no changes from his most current. On examination of his forearm he has a area that palpates to be a few millimeters of the scan and I believe is consistent with a subcutaneous lipoma. He shows me an area over his groin or states "sometimes it swollen sometimes it's not". He denies any pain there currently. There is no palpable abnormality overlying his femoral pulse. He is nontender over his femoral pulse. His leg is well perfused. He has no palpable adenopathy. No obvious carbuncle versus follicle abnormalities to the pubic hair skin. I think is appropriate for outpatient follow-up with his primary care physician without any further studies tonight.      Tanna Furry, MD 01/11/15 2204  Tanna Furry, MD 01/12/15 [phone]

## 2015-01-11 NOTE — ED Notes (Signed)
Patient stated that he needed to take his BP medication. I told patient that we needed him to not take anything by mouth and that we needed to run some tests. Patient refused and took his medication stating, "too late." Patient got out of bed and got water from sink to swallow pills.

## 2015-01-11 NOTE — Discharge Instructions (Signed)
Return to ER with any worsening symptoms.  This area of pain in your groin becomes worse, with associated swelling, or any numbness weakness tingling feeling in your leg. The area in your forearm is likely a benign growth of fat cells called a lipoma.  Lipoma A lipoma is a noncancerous (benign) tumor composed of fat cells. They are usually found under the skin (subcutaneous). A lipoma may occur in any tissue of the body that contains fat. Common areas for lipomas to appear include the back, shoulders, buttocks, and thighs. Lipomas are a very common soft tissue growth. They are soft and grow slowly. Most problems caused by a lipoma depend on where it is growing. DIAGNOSIS  A lipoma can be diagnosed with a physical exam. These tumors rarely become cancerous, but radiographic studies can help determine this for certain. Studies used may include:  Computerized X-ray scans (CT or CAT scan).  Computerized magnetic scans (MRI). TREATMENT  Small lipomas that are not causing problems may be watched. If a lipoma continues to enlarge or causes problems, removal is often the best treatment. Lipomas can also be removed to improve appearance. Surgery is done to remove the fatty cells and the surrounding capsule. Most often, this is done with medicine that numbs the area (local anesthetic). The removed tissue is examined under a microscope to make sure it is not cancerous. Keep all follow-up appointments with your caregiver. SEEK MEDICAL CARE IF:   The lipoma becomes larger or hard.  The lipoma becomes painful, red, or increasingly swollen. These could be signs of infection or a more serious condition. Document Released: 12/01/2002 Document Revised: 03/04/2012 Document Reviewed: 05/13/2010 [facility] Patient Information 2015 [place], [place]. This information is not intended to replace advice given to you by your health care provider. Make sure you discuss any questions you have with your health care provider.

## 2015-01-11 NOTE — ED Notes (Signed)
Pt has a nodule on right groin area, nodule is not painful, red or tender to touch. Pt also has small movable nontender nodule on right forearm.

## 2015-01-18 ENCOUNTER — Encounter: Payer: Medicaid Other | Admitting: Family Medicine

## 2015-01-25 ENCOUNTER — Inpatient Hospital Stay (HOSPITAL_COMMUNITY)
Admission: EM | Admit: 2015-01-25 | Discharge: 2015-01-28 | DRG: 303 | Disposition: A | Discharge status: Home / Self Care | Payer: Medicaid Other | Attending: Internal Medicine | Admitting: Internal Medicine

## 2015-01-25 ENCOUNTER — Emergency Department (HOSPITAL_COMMUNITY): Payer: Medicaid Other

## 2015-01-25 ENCOUNTER — Encounter (HOSPITAL_COMMUNITY): Payer: Self-pay

## 2015-01-25 DIAGNOSIS — Z8249 Family history of ischemic heart disease and other diseases of the circulatory system: Secondary | ICD-10-CM | POA: Diagnosis not present

## 2015-01-25 DIAGNOSIS — M109 Gout, unspecified: Secondary | ICD-10-CM | POA: Diagnosis present

## 2015-01-25 DIAGNOSIS — R072 Precordial pain: Secondary | ICD-10-CM | POA: Diagnosis not present

## 2015-01-25 DIAGNOSIS — I200 Unstable angina: Secondary | ICD-10-CM | POA: Diagnosis present

## 2015-01-25 DIAGNOSIS — D72819 Decreased white blood cell count, unspecified: Secondary | ICD-10-CM | POA: Diagnosis not present

## 2015-01-25 DIAGNOSIS — Z7901 Long term (current) use of anticoagulants: Secondary | ICD-10-CM | POA: Diagnosis not present

## 2015-01-25 DIAGNOSIS — Z951 Presence of aortocoronary bypass graft: Secondary | ICD-10-CM

## 2015-01-25 DIAGNOSIS — Z905 Acquired absence of kidney: Secondary | ICD-10-CM | POA: Diagnosis present

## 2015-01-25 DIAGNOSIS — N182 Chronic kidney disease, stage 2 (mild): Secondary | ICD-10-CM | POA: Diagnosis present

## 2015-01-25 DIAGNOSIS — I25110 Atherosclerotic heart disease of native coronary artery with unstable angina pectoris: Secondary | ICD-10-CM | POA: Diagnosis present

## 2015-01-25 DIAGNOSIS — Z95810 Presence of automatic (implantable) cardiac defibrillator: Secondary | ICD-10-CM

## 2015-01-25 DIAGNOSIS — E876 Hypokalemia: Secondary | ICD-10-CM | POA: Diagnosis not present

## 2015-01-25 DIAGNOSIS — I5022 Chronic systolic (congestive) heart failure: Secondary | ICD-10-CM | POA: Diagnosis present

## 2015-01-25 DIAGNOSIS — I2510 Atherosclerotic heart disease of native coronary artery without angina pectoris: Secondary | ICD-10-CM | POA: Diagnosis not present

## 2015-01-25 DIAGNOSIS — E785 Hyperlipidemia, unspecified: Secondary | ICD-10-CM | POA: Diagnosis present

## 2015-01-25 DIAGNOSIS — R079 Chest pain, unspecified: Secondary | ICD-10-CM

## 2015-01-25 DIAGNOSIS — Z85850 Personal history of malignant neoplasm of thyroid: Secondary | ICD-10-CM | POA: Diagnosis not present

## 2015-01-25 DIAGNOSIS — I255 Ischemic cardiomyopathy: Secondary | ICD-10-CM | POA: Diagnosis present

## 2015-01-25 DIAGNOSIS — D696 Thrombocytopenia, unspecified: Secondary | ICD-10-CM | POA: Diagnosis not present

## 2015-01-25 DIAGNOSIS — Z955 Presence of coronary angioplasty implant and graft: Secondary | ICD-10-CM

## 2015-01-25 DIAGNOSIS — I129 Hypertensive chronic kidney disease with stage 1 through stage 4 chronic kidney disease, or unspecified chronic kidney disease: Secondary | ICD-10-CM | POA: Diagnosis present

## 2015-01-25 DIAGNOSIS — G894 Chronic pain syndrome: Secondary | ICD-10-CM | POA: Diagnosis present

## 2015-01-25 LAB — BASIC METABOLIC PANEL
Anion gap: 4 — ABNORMAL LOW (ref 5–15)
BUN: 14 mg/dL (ref 6–23)
CALCIUM: 8.8 mg/dL (ref 8.4–10.5)
CHLORIDE: 104 mmol/L (ref 96–112)
CO2: 32 mmol/L (ref 19–32)
CREATININE: 1.2 mg/dL (ref 0.50–1.35)
GFR calc non Af Amer: 68 mL/min — ABNORMAL LOW (ref >90)
GFR, EST AFRICAN AMERICAN: 79 mL/min — AB (ref >90)
GLUCOSE: 107 mg/dL — AB (ref 70–99)
Potassium: 3.4 mmol/L — ABNORMAL LOW (ref 3.5–5.1)
Sodium: 140 mmol/L (ref 135–145)

## 2015-01-25 LAB — CBC
HEMATOCRIT: 40.2 % (ref 39.0–52.0)
HEMOGLOBIN: 13.3 g/dL (ref 13.0–17.0)
MCH: 26.9 pg (ref 26.0–34.0)
MCHC: 33.1 g/dL (ref 30.0–36.0)
MCV: 81.4 fL (ref 78.0–100.0)
Platelets: 134 10*3/uL — ABNORMAL LOW (ref 150–400)
RBC: 4.94 MIL/uL (ref 4.22–5.81)
RDW: 13.8 % (ref 11.5–15.5)
WBC: 3.3 10*3/uL — ABNORMAL LOW (ref 4.0–10.5)

## 2015-01-25 LAB — TROPONIN I
Troponin I: <0.03 ng/mL (ref <0.031)
Troponin I: <0.03 ng/mL (ref <0.031)
Troponin I: <0.03 ng/mL (ref <0.031)

## 2015-01-25 LAB — MRSA PCR SCREENING: MRSA BY PCR: NEGATIVE

## 2015-01-25 LAB — I-STAT TROPONIN, ED: TROPONIN I, POC: 0.01 ng/mL (ref 0.00–0.08)

## 2015-01-25 MED ORDER — NITROGLYCERIN IN D5W 200-5 MCG/ML-% IV SOLN
10.0000 ug/min | INTRAVENOUS | Status: DC
  Administered 2015-01-25: 10 ug/min via INTRAVENOUS
  Filled 2015-01-25: qty 250

## 2015-01-25 MED ORDER — AMLODIPINE BESYLATE 5 MG PO TABS
5.0000 mg | ORAL_TABLET | Freq: Every day | ORAL | Status: DC
  Administered 2015-01-25 – 2015-01-28 (×4): 5 mg via ORAL
  Filled 2015-01-25 (×5): qty 1

## 2015-01-25 MED ORDER — HEPARIN BOLUS VIA INFUSION
3000.0000 [IU] | Freq: Once | INTRAVENOUS | Status: AC
  Administered 2015-01-25: 3000 [IU] via INTRAVENOUS
  Filled 2015-01-25: qty 3000

## 2015-01-25 MED ORDER — OXYCODONE HCL 5 MG PO TABS: 30.0000 mg | ORAL_TABLET | Freq: Three times a day (TID) | ORAL | Status: DC | PRN

## 2015-01-25 MED ORDER — CLOPIDOGREL BISULFATE 75 MG PO TABS
75.0000 mg | ORAL_TABLET | Freq: Every day | ORAL | Status: DC
  Administered 2015-01-25 – 2015-01-28 (×4): 75 mg via ORAL
  Filled 2015-01-25 (×5): qty 1

## 2015-01-25 MED ORDER — ASPIRIN 81 MG PO CHEW: 324.0000 mg | CHEWABLE_TABLET | ORAL | Status: AC

## 2015-01-25 MED ORDER — ASPIRIN 300 MG RE SUPP: 300.0000 mg | RECTAL | Status: AC

## 2015-01-25 MED ORDER — ACETAMINOPHEN 325 MG PO TABS
650.0000 mg | ORAL_TABLET | ORAL | Status: DC | PRN
  Administered 2015-01-27: 650 mg via ORAL
  Filled 2015-01-25: qty 2

## 2015-01-25 MED ORDER — ASPIRIN EC 81 MG PO TBEC
81.0000 mg | DELAYED_RELEASE_TABLET | Freq: Every day | ORAL | Status: DC
  Administered 2015-01-26 – 2015-01-28 (×3): 81 mg via ORAL
  Filled 2015-01-25 (×3): qty 1

## 2015-01-25 MED ORDER — NITROGLYCERIN 0.4 MG SL SUBL
0.4000 mg | SUBLINGUAL_TABLET | SUBLINGUAL | Status: DC | PRN
  Administered 2015-01-26 – 2015-01-27 (×4): 0.4 mg via SUBLINGUAL
  Filled 2015-01-25 (×4): qty 1

## 2015-01-25 MED ORDER — ONDANSETRON HCL 4 MG/2ML IJ SOLN
4.0000 mg | Freq: Four times a day (QID) | INTRAMUSCULAR | Status: DC | PRN
  Administered 2015-01-26 – 2015-01-28 (×3): 4 mg via INTRAVENOUS
  Filled 2015-01-25 (×3): qty 2

## 2015-01-25 MED ORDER — MORPHINE SULFATE 4 MG/ML IJ SOLN
4.0000 mg | Freq: Once | INTRAMUSCULAR | Status: AC
  Administered 2015-01-25: 4 mg via INTRAVENOUS
  Filled 2015-01-25: qty 1

## 2015-01-25 MED ORDER — HYDRALAZINE HCL 50 MG PO TABS
50.0000 mg | ORAL_TABLET | Freq: Three times a day (TID) | ORAL | Status: DC
  Administered 2015-01-25 – 2015-01-28 (×10): 50 mg via ORAL
  Filled 2015-01-25 (×14): qty 1

## 2015-01-25 MED ORDER — EZETIMIBE 10 MG PO TABS
10.0000 mg | ORAL_TABLET | Freq: Every day | ORAL | Status: DC
  Administered 2015-01-25 – 2015-01-28 (×4): 10 mg via ORAL
  Filled 2015-01-25 (×5): qty 1

## 2015-01-25 MED ORDER — OXYCODONE HCL 5 MG PO TABS
30.0000 mg | ORAL_TABLET | Freq: Three times a day (TID) | ORAL | Status: DC | PRN
  Administered 2015-01-25 – 2015-01-28 (×9): 30 mg via ORAL
  Filled 2015-01-25 (×9): qty 6

## 2015-01-25 MED ORDER — CARVEDILOL 25 MG PO TABS
25.0000 mg | ORAL_TABLET | Freq: Two times a day (BID) | ORAL | Status: DC
  Administered 2015-01-25 – 2015-01-28 (×7): 25 mg via ORAL
  Filled 2015-01-25 (×10): qty 1

## 2015-01-25 MED ORDER — POTASSIUM CHLORIDE CRYS ER 20 MEQ PO TBCR
40.0000 meq | EXTENDED_RELEASE_TABLET | Freq: Once | ORAL | Status: AC
  Administered 2015-01-25: 40 meq via ORAL
  Filled 2015-01-25: qty 2

## 2015-01-25 MED ORDER — SODIUM CHLORIDE 0.9 % IV SOLN
INTRAVENOUS | Status: DC
  Administered 2015-01-25: 08:00:00 via INTRAVENOUS

## 2015-01-25 MED ORDER — HEPARIN (PORCINE) IN NACL 100-0.45 UNIT/ML-% IJ SOLN
900.0000 [IU]/h | INTRAMUSCULAR | Status: DC
  Administered 2015-01-25: 900 [IU]/h via INTRAVENOUS
  Filled 2015-01-25: qty 250

## 2015-01-25 MED ORDER — MORPHINE SULFATE 4 MG/ML IJ SOLN
4.0000 mg | INTRAMUSCULAR | Status: DC | PRN
  Administered 2015-01-25 – 2015-01-28 (×4): 4 mg via INTRAVENOUS
  Filled 2015-01-25 (×4): qty 1

## 2015-01-25 MED ORDER — ENOXAPARIN SODIUM 40 MG/0.4ML ~~LOC~~ SOLN
40.0000 mg | SUBCUTANEOUS | Status: DC
  Administered 2015-01-25: 40 mg via SUBCUTANEOUS
  Filled 2015-01-25 (×4): qty 0.4

## 2015-01-25 NOTE — Consult Note (Signed)
Family Medicine Teaching Service Consult note Service Pager: [phone]  Patient name: Nicholes Hibler Medical record number: [id number] Date of birth: 05-11-1961 Age: 54 y.o. Gender: male  Primary Care Provider: Christa See, MD Code Status: Full  Chief Complaint: chest pain  Assessment and Plan: Maksymilian Longmore is a 54 y.o. male presenting with chest pain. PMH is significant for CAD s/p CABG & DES, Aortic dissection s/p repair, AICD for chronic systolic CHF (EF 88%), HTN, CKD with solitary kidney, HLD, Anemia, Gout, chronic pain syndrome  # Chest pain: extensive cardiac history. Initial workup unremarkable, troponins negative. Started on nitro and heparin gtt in ED. Followed closely by Dr. Haroldine Laws.   - management per cards - on plavix - nitro gtt - heparin gtt - interrogate AICD  # HTN - home amlodipine, coreg, hydralazineg  # HLD: does not appear to tolerate statins - home zetia  # Anemia: stable hgb 13.3 - trend  # Chronic pain:  - recommend continue home oxyIR 30mg  q8hrs prn once tolerating po  # CKD: Scr 1.2, appears to be below baseline 1.4 - trend  FEN/GI: npo Prophylaxis: heparin gtt  Disposition: admit  History of Present Illness: Richy Werntz is a 55 y.o. male presenting with worsening chest pain for the past 2 days. Pt has extensive cardiac history, says he is followed by Dr. Sung Amabile. Says he has had chest pains "for a long time", worsened over past 2 days. Pain is crampy/achy at rest and does not relate the chest pains to exertion or physical activity, not relieved by rest, states that it has been relieved by nitro which he has continued to take until he ran out. He says while in the car he had an electric type pain in his chest as well, not sure if his defibrillator activated ("'it's been a while since it has been on"). Pt states he will not go to cath unless he is told to by Dr. Haroldine Laws. Pt does say he takes oxy IR 30mg  every 8 hours for pain,  and that his legs are starting to feel like they are getting painful; says normally takes this at 6am.  Review Of Systems: Per HPI with the following additions: nausea, no vomiting, headache, no shortness of breath Otherwise 12 point review of systems was performed and was unremarkable.  Patient Active Problem List   Diagnosis Date Noted  . Chest pain 10/10/2014  . Health care maintenance 03/19/2014  . Local reaction to tetanus vaccine 03/19/2014  . Neck pain on left side 06/03/2013  . Penile discharge 02/26/2013  . Syncope 01/17/2013  . Acute on chronic systolic and diastolic heart failure, NYHA class 3 01/16/2013  . Chest pain, mid sternal 01/16/2013  . Low back pain 04/23/2012  . Chest pain 12/04/2011  . Chest pain syndrome   . Insomnia 09/14/2011  . 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 Coronary atherosclerosis of native coronary artery 06/12/2011  . Bradycardia 05/03/2011  . Aortic dissection, thoracoabdominal   . Chronic kidney disease (CKD), stage II (mild)   . ORTHOSTATIC HYPOTENSION 05/03/2010  . CAROTID BRUIT 01/25/2010  . PANCYTOPENIA 11/01/2009  . FATIGUE / MALAISE 10/25/2009  . DISSECTING AORTIC ANEURYSM THORACOABDOMINAL 07/14/2009  . SCIATICA, RIGHT 05/18/2009  . HYPERLIPIDEMIA-MIXED 09/29/2008  . HYPERTENSION, BENIGN 09/29/2008  . CAD, ARTERY BYPASS GRAFT 09/29/2008  . SYSTOLIC HEART FAILURE, CHRONIC 09/29/2008  . ICD - IN SITU 09/29/2008   Past Medical History: Past Medical History  Diagnosis Date  . CAD (coronary artery  disease)     a. s/p CABG 2006;  b. DES to PDA 2011 (cath: Dx not seen, dRCA/PDA tx with DES; S-PDA occluded (culprit), S-Dx occluded, S-RI and OM ok, L-LAD ok  . HTN (hypertension)     severe  . Chronic systolic heart failure     a. 12/13 ECHO: EF 35-40%, sept, apical & posterobasal HK, LV mod dil & sys fx mod reduced, mild AI, MV mild reg, TV mild reg  . Aortic dissection, thoracoabdominal     7/10: Type  I s/p repair  . CRI (chronic renal insufficiency)   . Thyroid cancer     Hertle Cell  . HLD (hyperlipidemia)   . Anemia   . Gout   . AICD (automatic cardioverter/defibrillator) present   . Carotid stenosis     dopplers 2011: 0-39% bilat.  . Chest pain syndrome    Past Surgical History: Past Surgical History  Procedure Laterality Date  . Coronary artery bypass graft  06/21/2009    x2  . Cardiac defibrillator placement      [employer]  . Thyroidectomy, partial  06/20/11  . Lumbar disc surgery      x 2, most recent within 5-10 years  . Status post emergency repair of a type a ascending aortic dissection with a hemiarch reconstruction of the ascending aorta  using a 28-mm hemashield graft with redo sternotomy and revision of previous bypass grafts in june 2010.    . Median sternotomy,cabg x 7  06/29/2005  . Implantable cardioverter defibrillator (icd) generator change N/A 12/13/2012    Procedure: ICD GENERATOR CHANGE;  Surgeon: Evans Lance, MD;  Location: [facility] CATH LAB;  Service: Cardiovascular;  Laterality: N/A;   Social History: History  Substance Use Topics  . Smoking status: Never Smoker   . Smokeless tobacco: Never Used  . Alcohol Use: No   Additional social history: none  Please also refer to relevant sections of EMR.  Family History: Family History  Problem Relation Age of Onset  . Coronary artery disease    . Hypertension Father   . Heart disease Father   . Early death Father   . COPD Father   . Hypertension Mother    Allergies and Medications: Allergies  Allergen Reactions  . Iohexol Anaphylaxis and Other (See Comments)     Desc: PT HAS ANAPHYLAXIS WITH CONTRAST MEDIA!  . Lipitor [Atorvastatin Calcium] Anaphylaxis and Other (See Comments)    Large doses  . Sulfonamide Derivatives Shortness Of Breath  . Latex Other (See Comments)    With long periods of exposure  . Zocor [Simvastatin] Other (See Comments)    Muscle cramps   No current  facility-administered medications on file prior to encounter.   Current Outpatient Prescriptions on File Prior to Encounter  Medication Sig Dispense Refill  . amLODipine (NORVASC) 5 MG tablet Take 1 tablet (5 mg total) by mouth 2 (two) times daily. 60 tablet 6  . carvedilol (COREG) 25 MG tablet Take 1 tablet (25 mg total) by mouth 2 (two) times daily with a meal. 60 tablet 6  . clopidogrel (PLAVIX) 75 MG tablet Take 1 tablet (75 mg total) by mouth daily. 30 tablet 6  . cyclobenzaprine (FLEXERIL) 10 MG tablet Take 10 mg by mouth daily as needed for muscle spasms.     [name] ezetimibe (ZETIA) 10 MG tablet Take 1 tablet (10 mg total) by mouth daily. (Patient taking differently: Take 10 mg by mouth as needed. ) 30 tablet 6  .  furosemide (LASIX) 40 MG tablet Take 1 tablet (40 mg total) by mouth daily as needed for fluid. 30 tablet 6  . hydrALAZINE (APRESOLINE) 25 MG tablet Take 3 tablets (75 mg total) by mouth 3 (three) times daily. (Patient taking differently: Take 50 mg by mouth 3 (three) times daily. ) 270 tablet 6  . isosorbide mononitrate (IMDUR) 30 MG 24 hr tablet Take 30 mg by mouth 2 (two) times daily.    [name] loratadine (CLARITIN) 10 MG tablet Take 1 tablet (10 mg total) by mouth daily as needed for allergies. 30 tablet 11  . nitroGLYCERIN (NITROSTAT) 0.4 MG SL tablet Place 1 tablet (0.4 mg total) under the tongue every 5 (five) minutes as needed for chest pain. 50 tablet 3  . OVER THE COUNTER MEDICATION Take 1 capsule by mouth daily. "Mega Men 50 Plus"    . oxycodone (ROXICODONE) 30 MG immediate release tablet Take 30 mg by mouth every 8 (eight) hours as needed for pain.     . potassium chloride SA (K-DUR,KLOR-CON) 20 MEQ tablet Take 20 mEq by mouth as needed (takes with lasix).       Objective: BP 143/79 mmHg  Pulse 65  Resp 19  Ht 5\' 11"  (1.803 m)  Wt 158 lb 1.1 oz (71.7 kg)  BMI 22.06 kg/m2  SpO2 100% Exam: General: lying down in bed, appears fatigued HEENT: PERRL, EOMI, mm  dry Cardiovascular: RRR, normal C4U8, systolic murmur appreciated. 2+ radial and PT pulses bilaterally Respiratory: clear, normal effort Abdomen: soft, thin, nontender, nondistended, normal bowel sounds Extremities: no edema or cyanosis, wwp Skin: no rashes Neuro: alert/oriented, fatigued  Labs and Imaging: CBC BMET   Recent Labs Lab 01/25/15 0448  WBC 3.3*  HGB 13.3  HCT 40.2  PLT 134*    Recent Labs Lab 01/25/15 0448  NA 140  K 3.4*  CL 104  CO2 32  BUN 14  CREATININE 1.20  GLUCOSE 107*  CALCIUM 8.8       Leone Brand, MD 01/25/2015, 5:36 AM PGY-2, [facility] pager: [phone], text pages welcome

## 2015-01-25 NOTE — Progress Notes (Signed)
Attempted to call report to 2W 

## 2015-01-25 NOTE — Progress Notes (Signed)
Called by RN regarding parameters on hydralazine.  On the original order, a parameter was put on the hydralazine to hold for systolic blood pressure less than 130.  The medication was given earlier today but was held this p.m. because he did not meet the parameter.  In the H&P, the possibility of a dynamic outflow tract obstruction was mentioned.  He has not been unstable and systolic blood pressure has been 120s-140s.  The patient feels strongly that he should receive this medication.  We'll change the parameter to hold for systolic blood pressure less than 110. Continue to follow vital signs carefully.  Rosaria Ferries, PA-C 01/25/2015 5:49 PM Beeper [phone]

## 2015-01-25 NOTE — Progress Notes (Signed)
Patient seen and examined. Dr. Marijo Sanes [facility] & P reviewed.  Chad [name] has suffered from chronic CP. Admitted with recurrent CP at rest that was worse than baseline but unlike previous MI.  Troponins and ECG ok.  Now CP free.   Given chronic descending Ao dissection and solitary kidney, I am reluctant to cath him without objective evidence of ischemia.   Will proceed with Lexiscan Myoview in am. Stop heparin.   Chad Bensimhon,MD 10:45 AM

## 2015-01-25 NOTE — H&P (Signed)
Admission History and Physical     Patient ID: Chad [name], MRN: [id number], DOB: 25-May-1961 54 y.o. Date of Encounter: 01/25/2015, 5:52 AM  Primary Physician: Christa See, MD Primary Cardiologist: Charlett Blake Primary Electrophysiologist:  Cristopher Peru  Chief Complaint:  Chest Pain  History of Present Illness: Chad [name] is a 54 y.o. male with ICM EF 25%, s/p CABG in 2006 and subsequent PCI with DES in 2011, ascending Ao Dissection with repair in 2010, who presents with angina at rest.  He states that a few days ago, he is not sure exactly when, he felt his heart skip, and he thinks he may have been shocked by his ICD.  He has felt fine since then, but early this AM he was woken from sleep with chest pain and nausea.  He describes the pain as a cramping feeling in the center of his chest radiating to his neck and L arm.  Different than the pain he has experienced in the past with his dissection and prior to CABG as well.  Ran out of NTG at home.  In ED he was quickly started on heparin gtt and NTG gtt, and his pain is improved but still present.  Vitals are stable, trop is negative x1.  Of note he had a SPECT MPI negative for ischemia in October 2015.   Past Medical History  Diagnosis Date  . CAD (coronary artery disease)     a. s/p CABG 2006;  b. DES to PDA 2011 (cath: Dx not seen, dRCA/PDA tx with DES; S-PDA occluded (culprit), S-Dx occluded, S-RI and OM ok, L-LAD ok  . HTN (hypertension)     severe  . Chronic systolic heart failure     a. 12/13 ECHO: EF 35-40%, sept, apical & posterobasal HK, LV mod dil & sys fx mod reduced, mild AI, MV mild reg, TV mild reg  . 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 CRI (chronic renal insufficiency)   . Thyroid cancer     Hertle Cell  . HLD (hyperlipidemia)   . Anemia   . Gout   . AICD (automatic cardioverter/defibrillator) present   . Carotid stenosis     dopplers 2011: 0-39% bilat.  . Chest  pain syndrome      Past Surgical History  Procedure Laterality Date  . Coronary artery bypass graft  06/21/2009    x2  . Cardiac defibrillator placement      [employer]  . Thyroidectomy, partial  06/20/11  . Lumbar disc surgery      x 2, most recent within 5-10 years  . Status post emergency repair of a type a ascending aortic dissection with a hemiarch reconstruction of the ascending aorta  using a 28-mm hemashield graft with redo sternotomy and revision of previous bypass grafts in june 2010.    . Median sternotomy,cabg x 7  06/29/2005  . Implantable cardioverter defibrillator (icd) generator change N/A 12/13/2012    Procedure: ICD GENERATOR CHANGE;  Surgeon: Evans Lance, MD;  Location: [facility] C[facility] LAB;  Service: Cardiovascular;  Laterality: N/A;      Current Facility-Administered Medications  Medication Dose Route Frequency Provider Last Rate Last Dose  . acetaminophen (TYLENOL) tablet 650 mg  650 mg Oral Q4H PRN Stephani Police, MD      . amLODipine (NORVASC) tablet 5 mg  5 mg Oral Daily Stephani Police, MD      . aspirin chewable tablet 324 mg  324 mg Oral  NOW Stephani Police, MD       Or  . aspirin suppository 300 mg  300 mg Rectal NOW Stephani Police, MD      . Derrill Memo ON 01/26/2015] aspirin EC tablet 81 mg  81 mg Oral Daily Stephani Police, MD      . carvedilol (COREG) tablet 25 mg  25 mg Oral BID WC Stephani Police, MD      . clopidogrel (PLAVIX) tablet 75 mg  75 mg Oral Daily Stephani Police, MD      . ezetimibe (ZETIA) tablet 10 mg  10 mg Oral Daily Stephani Police, MD      . heparin ADULT infusion 100 units/mL (25000 units/250 mL)  900 Units/hr Intravenous Continuous Rogue Bussing, RPH 9 mL/hr at 01/25/15 0531 900 Units/hr at 01/25/15 0531  . hydrALAZINE (APRESOLINE) tablet 50 mg  50 mg Oral TID Stephani Police, MD      . nitroGLYCERIN (NITROSTAT) SL tablet 0.4 mg  0.4 mg Sublingual Q5 Min x 3 PRN Stephani Police, MD      . nitroGLYCERIN 50 mg in dextrose 5 % 250 mL (0.2 mg/mL) infusion   10-200 mcg/min Intravenous Titrated Delora Fuel, MD 3 mL/hr at 32/44/01 0516 10 mcg/min at 01/25/15 0516  . ondansetron (ZOFRAN) injection 4 mg  4 mg Intravenous Q6H PRN Stephani Police, MD       Current Outpatient Prescriptions  Medication Sig Dispense Refill  . amLODipine (NORVASC) 5 MG tablet Take 1 tablet (5 mg total) by mouth 2 (two) times daily. 60 tablet 6  . carvedilol (COREG) 25 MG tablet Take 1 tablet (25 mg total) by mouth 2 (two) times daily with a meal. 60 tablet 6  . clopidogrel (PLAVIX) 75 MG tablet Take 1 tablet (75 mg total) by mouth daily. 30 tablet 6  . cyclobenzaprine (FLEXERIL) 10 MG tablet Take 10 mg by mouth daily as needed for muscle spasms.     [name] ezetimibe (ZETIA) 10 MG tablet Take 1 tablet (10 mg total) by mouth daily. (Patient taking differently: Take 10 mg by mouth as needed. ) 30 tablet 6  . furosemide (LASIX) 40 MG tablet Take 1 tablet (40 mg total) by mouth daily as needed for fluid. 30 tablet 6  . hydrALAZINE (APRESOLINE) 25 MG tablet Take 3 tablets (75 mg total) by mouth 3 (three) times daily. (Patient taking differently: Take 50 mg by mouth 3 (three) times daily. ) 270 tablet 6  . isosorbide mononitrate (IMDUR) 30 MG 24 hr tablet Take 30 mg by mouth 2 (two) times daily.    [name] loratadine (CLARITIN) 10 MG tablet Take 1 tablet (10 mg total) by mouth daily as needed for allergies. 30 tablet 11  . nitroGLYCERIN (NITROSTAT) 0.4 MG SL tablet Place 1 tablet (0.4 mg total) under the tongue every 5 (five) minutes as needed for chest pain. 50 tablet 3  . OVER THE COUNTER MEDICATION Take 1 capsule by mouth daily. "Mega Men 50 Plus"    . oxycodone (ROXICODONE) 30 MG immediate release tablet Take 30 mg by mouth every 8 (eight) hours as needed for pain.     . potassium chloride SA (K-DUR,KLOR-CON) 20 MEQ tablet Take 20 mEq by mouth as needed (takes with lasix).         Allergies: Allergies  Allergen Reactions  . Iohexol Anaphylaxis and Other (See Comments)     Desc: PT HAS  ANAPHYLAXIS WITH CONTRAST MEDIA!  . Lipitor [Atorvastatin Calcium] Anaphylaxis and Other (See Comments)  Large doses  . Sulfonamide Derivatives Shortness Of Breath  . Latex Other (See Comments)    With long periods of exposure  . Zocor [Simvastatin] Other (See Comments)    Muscle cramps     Social History:  The patient  reports that he has never smoked. He has never used smokeless tobacco. He reports that he does not drink alcohol or use illicit drugs.   Family History:  The patient's family history includes COPD in his father; Coronary artery disease in an other family member; Early death in his father; Heart disease in his father; Hypertension in his father and mother.   ROS:  Please see the history of present illness.  All other systems reviewed and negative.   Vital Signs: Blood pressure 143/79, pulse 65, resp. rate 19, height 5\' 11"  (1.803 m), weight 71.7 kg (158 lb 1.1 oz), SpO2 100 %.  PHYSICAL EXAM: General:  Resting in bed in mild distress HEENT: normal Lymph: no adenopathy Neck: JVP is normal Endocrine:  No thryomegaly Cardiac:  normal S1, S2; RRR; 2/6 late systolic murmur  Lungs:  clear to auscultation bilaterally, no wheezing, rhonchi or rales Abd: soft, nontender, no hepatomegaly Ext: no edema Musculoskeletal:  No deformities, BUE and BLE strength normal and equal Skin: warm and dry Neuro:  CNs 2-12 intact, no focal abnormalities noted Psych:  Normal affect   EKG:   NSR.  Inferior and lateral TWI which are old.  Labs:   Lab Results  Component Value Date   WBC 3.3* 01/25/2015   HGB 13.3 01/25/2015   HCT 40.2 01/25/2015   MCV 81.4 01/25/2015   PLT 134* 01/25/2015    Recent Labs Lab 01/25/15 0448  NA 140  K 3.4*  CL 104  CO2 32  BUN 14  CREATININE 1.20  CALCIUM 8.8  GLUCOSE 107*    Recent Labs  01/25/15 0448  TROPONINI <0.03   Lab Results  Component Value Date   CHOL 159 10/11/2014   HDL 57 10/11/2014   LDLCALC 92 10/11/2014   TRIG 52  10/11/2014   Radiology/Studies:  Dg Chest Portable 1 View  01/25/2015   CLINICAL DATA:  Chest pain. History of aortic dissection post repair.  EXAM: PORTABLE CHEST - 1 VIEW  COMPARISON:  Radiograph 10/10/2014, CT 10/11/2014  FINDINGS: Patient is post median sternotomy. Left-sided pacemaker remains in place. Cardiomegaly is not significantly changed allowing for differences in technique. Aneurysmal dilatation of the aortic arch appears similar to prior radiograph. Lower lung volumes from prior. No consolidation. No pleural effusion or pneumothorax. Pulmonary vasculature is normal. Osseous structures are intact.  IMPRESSION: No evidence of acute pulmonary process. Stable cardiomegaly and mediastinal contours.   Electronically Signed   By: Jeb Levering M.D.   On: 01/25/2015 05:12     ASSESSMENT AND PLAN:   1. Unstable Angina - I agree with ED assessment that this is unstable angina, different from prior dissection pain (but also different from pre CABG pain). - Continue heparin and nitro gtt - Continue home asa/plavix - NPO for possible cath today.  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  Also prior anaphylaxis to contrast dye.   - Continue home BP meds including coreg 25mg .   2. Ischemic cardiomyopathy EF 30% - This appears stable on exam and by symptoms - Will order a TTE, I wonder if he has some dynamic outflow tract obstruction based on murmur.  3. ? ICD Shock - Device interrogation  today.    Signed,  Stephani Police, MD 01/25/2015, 6:09 AM

## 2015-01-25 NOTE — Progress Notes (Deleted)
Transferred to 2W22 via [place] and monitor. Pt placed in bed. RN in to see pt.

## 2015-01-25 NOTE — Plan of Care (Signed)
Report to 2 W RN 

## 2015-01-25 NOTE — Progress Notes (Signed)
ANTICOAGULATION CONSULT NOTE - Initial Consult  Pharmacy Consult for heparin Indication: chest pain/ACS  Allergies  Allergen Reactions  . Iohexol Anaphylaxis and Other (See Comments)     Desc: PT HAS ANAPHYLAXIS WITH CONTRAST MEDIA!  . Lipitor [Atorvastatin Calcium] Anaphylaxis and Other (See Comments)    Large doses  . Sulfonamide Derivatives Shortness Of Breath  . Latex Other (See Comments)    With long periods of exposure  . Zocor [Simvastatin] Other (See Comments)    Muscle cramps    Patient Measurements: Height: 5\' 11"  (180.3 cm) Weight: 158 lb 1.1 oz (71.7 kg) IBW/kg (Calculated) : 75.3  Vital Signs: BP: 142/85 mmHg (02/01 0452) Pulse Rate: 76 (02/01 0452)  Estimated Creatinine Clearance: 61.7 mL/min (by C-G formula based on Cr of 1.42).   Medical History: Past Medical History  Diagnosis Date  . CAD (coronary artery disease)     a. s/p CABG 2006;  b. DES to PDA 2011 (cath: Dx not seen, dRCA/PDA tx with DES; S-PDA occluded (culprit), S-Dx occluded, S-RI and OM ok, L-LAD ok  . HTN (hypertension)     severe  . Chronic systolic heart failure     a. 12/13 ECHO: EF 35-40%, sept, apical & posterobasal HK, LV mod dil & sys fx mod reduced, mild AI, MV mild reg, TV mild reg  . 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 CRI (chronic renal insufficiency)   . Thyroid cancer     Hertle Cell  . HLD (hyperlipidemia)   . Anemia   . Gout   . AICD (automatic cardioverter/defibrillator) present   . Carotid stenosis     dopplers 2011: 0-39% bilat.  . Chest pain syndrome       Assessment: 54yo male c/o left-sided CP radiating to neck and left arm, improved after NTG x2, initial troponin negative, to begin heparin.  Goal of Therapy:  Heparin level 0.3-0.7 units/ml Monitor platelets by anticoagulation protocol: Yes   Plan:  Will give heparin 3000 units IV bolus x1 followed by gtt at 900 units/hr and monitor heparin levels and CBC.  Wynona Neat,  PharmD, BCPS  01/25/2015,5:03 AM

## 2015-01-25 NOTE — ED Provider Notes (Signed)
CSN: [id number]     Arrival date & time 01/25/15  5462 History   First MD Initiated Contact with Patient 01/25/15 0445     Chief Complaint  Patient presents with  . Chest Pain     (Consider location/radiation/quality/duration/timing/severity/associated sxs/prior Treatment) Patient is a 54 y.o. male presenting with chest pain. The history is provided by the patient.  Chest Pain He was awakened with left-sided chest pain which radiated to his neck and left arm. There was a pressure feeling which she rated at 8/10. He has been having chest pains for the last several days which have been relieved with nitroglycerin. He ran out of nitroglycerin. He called for an ambulance who gave him aspirin and nitroglycerin and pain has decreased to 4/10. He has history of myocardial infarction, coronary bypass, cardiac stenting, and implanted defibrillator as well as history of aortic dissection with placement of graft. This pain is different from what he had before his bypass surgery in that previous heart related pains of been in his back. There is associated dyspnea, nausea, diaphoresis.  Past Medical History  Diagnosis Date  . CAD (coronary artery disease)     a. s/p CABG 2006;  b. DES to PDA 2011 (cath: Dx not seen, dRCA/PDA tx with DES; S-PDA occluded (culprit), S-Dx occluded, S-RI and OM ok, L-LAD ok  . HTN (hypertension)     severe  . Chronic systolic heart failure     a. 12/13 ECHO: EF 35-40%, sept, apical & posterobasal HK, LV mod dil & sys fx mod reduced, mild AI, MV mild reg, TV mild reg  . 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 CRI (chronic renal insufficiency)   . Thyroid cancer     Hertle Cell  . HLD (hyperlipidemia)   . Anemia   . Gout   . AICD (automatic cardioverter/defibrillator) present   . Carotid stenosis     dopplers 2011: 0-39% bilat.  . Chest pain syndrome    Past Surgical History  Procedure Laterality Date  . Coronary artery bypass graft   06/21/2009    x2  . Cardiac defibrillator placement      [employer]  . Thyroidectomy, partial  06/20/11  . Lumbar disc surgery      x 2, most recent within 5-10 years  . Status post emergency repair of a type a ascending aortic dissection with a hemiarch reconstruction of the ascending aorta  using a 28-mm hemashield graft with redo sternotomy and revision of previous bypass grafts in june 2010.    . Median sternotomy,cabg x 7  06/29/2005  . Implantable cardioverter defibrillator (icd) generator change N/A 12/13/2012    Procedure: ICD GENERATOR CHANGE;  Surgeon: Evans Lance, MD;  Location: [facility] CATH LAB;  Service: Cardiovascular;  Laterality: N/A;   Family History  Problem Relation Age of Onset  . Coronary artery disease    . Hypertension Father   . Heart disease Father   . Early death Father   . COPD Father   . Hypertension Mother    History  Substance Use Topics  . Smoking status: Never Smoker   . Smokeless tobacco: Never Used  . Alcohol Use: No    Review of Systems  Cardiovascular: Positive for chest pain.  All other systems reviewed and are negative.     Allergies  Iohexol; Lipitor; Sulfonamide derivatives; Latex; and Zocor  Home Medications   Prior to Admission medications   Medication Sig Start Date  End Date Taking? Authorizing Provider  amLODipine (NORVASC) 5 MG tablet Take 1 tablet (5 mg total) by mouth 2 (two) times daily. 06/25/14   Jolaine Artist, MD  carvedilol (COREG) 25 MG tablet Take 1 tablet (25 mg total) by mouth 2 (two) times daily with a meal. 06/25/14   Jolaine Artist, MD  clopidogrel (PLAVIX) 75 MG tablet Take 1 tablet (75 mg total) by mouth daily. 08/10/14   Jolaine Artist, MD  cyclobenzaprine (FLEXERIL) 10 MG tablet Take 10 mg by mouth daily as needed for muscle spasms.     Historical Provider, MD  ezetimibe (ZETIA) 10 MG tablet Take 1 tablet (10 mg total) by mouth daily. Patient taking differently: Take 10 mg by mouth as needed.   06/25/14   Jolaine Artist, MD  furosemide (LASIX) 40 MG tablet Take 1 tablet (40 mg total) by mouth daily as needed for fluid. 06/25/14   Jolaine Artist, MD  hydrALAZINE (APRESOLINE) 25 MG tablet Take 3 tablets (75 mg total) by mouth 3 (three) times daily. Patient taking differently: Take 50 mg by mouth 3 (three) times daily.  06/25/14   Jolaine Artist, MD  isosorbide mononitrate (IMDUR) 30 MG 24 hr tablet Take 30 mg by mouth 2 (two) times daily.    Historical Provider, MD  loratadine (CLARITIN) 10 MG tablet Take 1 tablet (10 mg total) by mouth daily as needed for allergies. 07/29/13   Amy D Ninfa Meeker, NP  nitroGLYCERIN (NITROSTAT) 0.4 MG SL tablet Place 1 tablet (0.4 mg total) under the tongue every 5 (five) minutes as needed for chest pain. 12/04/14   Jolaine Artist, MD  OVER THE COUNTER MEDICATION Take 1 capsule by mouth daily. "Mega Men 50 Plus"    Historical Provider, MD  oxycodone (ROXICODONE) 30 MG immediate release tablet Take 30 mg by mouth every 8 (eight) hours as needed for pain.     Historical Provider, MD  potassium chloride SA (K-DUR,KLOR-CON) 20 MEQ tablet Take 20 mEq by mouth as needed (takes with lasix).     Historical Provider, MD   BP 142/85 mmHg  Pulse 76  Resp 24  Ht 5\' 11"  (1.803 m)  Wt 158 lb 1.1 oz (71.7 kg)  BMI 22.06 kg/m2  SpO2 100% Physical Exam  Nursing note and vitals reviewed.  54 year old male, resting comfortably and in no acute distress. Vital signs are significant for tachypnea and borderline hypertension. Oxygen saturation is 100%, which is normal. Head is normocephalic and atraumatic. PERRLA, EOMI. Oropharynx is clear. Neck is nontender and supple without adenopathy or JVD. Back is nontender and there is no CVA tenderness. Lungs are clear without rales, wheezes, or rhonchi. Chest is nontender. Pacemaker/defibrillator is present in the left subclavian area. Heart has regular rate and rhythm without murmur. Abdomen is soft, flat, nontender without  masses or hepatosplenomegaly and peristalsis is normoactive. Extremities have no cyanosis or edema, full range of motion is present. Skin is warm and dry without rash. Neurologic: Mental status is normal, cranial nerves are intact, there are no motor or sensory deficits.  ED Course  Procedures (including critical care time) Labs Review Results for orders placed or performed during the hospital encounter of 01/25/15  CBC  Result Value Ref Range   WBC 3.3 (L) 4.0 - 10.5 K/uL   RBC 4.94 4.22 - 5.81 MIL/uL   Hemoglobin 13.3 13.0 - 17.0 g/dL   HCT 40.2 39.0 - 52.0 %   MCV 81.4 78.0 - 100.0  fL   MCH 26.9 26.0 - 34.0 pg   MCHC 33.1 30.0 - 36.0 g/dL   RDW 13.8 11.5 - 15.5 %   Platelets 134 (L) 150 - 400 K/uL  Basic metabolic panel  Result Value Ref Range   Sodium 140 135 - 145 mmol/L   Potassium 3.4 (L) 3.5 - 5.1 mmol/L   Chloride 104 96 - 112 mmol/L   CO2 32 19 - 32 mmol/L   Glucose, Bld 107 (H) 70 - 99 mg/dL   BUN 14 6 - 23 mg/dL   Creatinine, Ser 1.20 0.50 - 1.35 mg/dL   Calcium 8.8 8.4 - 10.5 mg/dL   GFR calc non Af Amer 68 (L) >90 mL/min   GFR calc Af Amer 79 (L) >90 mL/min   Anion gap 4 (L) 5 - 15  Troponin I  Result Value Ref Range   Troponin I <0.03 <0.031 ng/mL  I-stat troponin, ED (not at [facility])  Result Value Ref Range   Troponin i, poc 0.01 0.00 - 0.08 ng/mL   Comment 3           Imaging Review Dg Chest Portable 1 View  01/25/2015   CLINICAL DATA:  Chest pain. History of aortic dissection post repair.  EXAM: PORTABLE CHEST - 1 VIEW  COMPARISON:  Radiograph 10/10/2014, CT 10/11/2014  FINDINGS: Patient is post median sternotomy. Left-sided pacemaker remains in place. Cardiomegaly is not significantly changed allowing for differences in technique. Aneurysmal dilatation of the aortic arch appears similar to prior radiograph. Lower lung volumes from prior. No consolidation. No pleural effusion or pneumothorax. Pulmonary vasculature is normal. Osseous structures are intact.   IMPRESSION: No evidence of acute pulmonary process. Stable cardiomegaly and mediastinal contours.   Electronically Signed   By: Jeb Levering M.D.   On: 01/25/2015 05:12     EKG Interpretation   Date/Time:  Monday January 25 2015 04:47:50 EST Ventricular Rate:  71 PR Interval:  197 QRS Duration: 113 QT Interval:  428 QTC Calculation: 465 R Axis:   -13 Text Interpretation:  Sinus rhythm Biatrial enlargement Incomplete left  bundle branch block LVH with secondary repolarization abnormality When  compared with ECG of 01/11/2015, No significant change was found Confirmed  by [facility]  MD, [place]en [place]yard ([id number]) on 01/25/2015 4:51:17 AM      CRITICAL CARE Performed by: [email or web address],OVFIE Total critical care time: 40 minutes Critical care time was exclusive of separately billable procedures and treating other patients. Critical care was necessary to treat or prevent imminent or life-threatening deterioration. Critical care was time spent personally by me on the following activities: development of treatment plan with patient and/or surrogate as well as nursing, discussions with consultants, evaluation of patient's response to treatment, examination of patient, obtaining history from patient or surrogate, ordering and performing treatments and interventions, ordering and review of laboratory studies, ordering and review of radiographic studies, pulse oximetry and re-evaluation of patient's condition.  MDM   Final diagnoses:  Unstable angina    Chest pain in pattern very worrisome for unstable angina. No acute ECG changes seen. Old records are reviewed and he had been admitted to the hospital in October for chest pain and had outpatient stress Myoview in early December which showed no evidence of ischemia but fixed defects present. He has known ischemic cardiomyopathy with ejection fraction proximally 30%. Current pain is not radiating into his back and he is not hypertensive so I do not feel that this is  related to his prior aortic dissection. He  is started on intravenous nitroglycerin and given morphine for pain. Cardiology is being consulted. Have discussed case with Dr. Kennith Center who is coming to the ED to help evaluate the patient. Case is also discussed with family practice resident since patient goes to the family practice center.    Delora Fuel, MD 44/69/50 [id number]

## 2015-01-25 NOTE — ED Notes (Signed)
PER EMS: pt reports left sided CP that radiates to his neck and left arm. Pt was sleeping when it started about an hour ago. 324 asa and 2 nitro, pain went from 10/10 to 4/10, refused 3rd nitro. Denies SOB. Hx of stents, CABG, MI, has pacemaker. 100% 2L [place]. A&Ox4.

## 2015-01-26 ENCOUNTER — Inpatient Hospital Stay (HOSPITAL_COMMUNITY): Payer: Medicaid Other

## 2015-01-26 ENCOUNTER — Encounter (HOSPITAL_COMMUNITY): Payer: Self-pay | Admitting: *Deleted

## 2015-01-26 DIAGNOSIS — R079 Chest pain, unspecified: Secondary | ICD-10-CM

## 2015-01-26 DIAGNOSIS — I255 Ischemic cardiomyopathy: Secondary | ICD-10-CM

## 2015-01-26 DIAGNOSIS — I200 Unstable angina: Secondary | ICD-10-CM

## 2015-01-26 LAB — BASIC METABOLIC PANEL
Anion gap: 4 — ABNORMAL LOW (ref 5–15)
BUN: 11 mg/dL (ref 6–23)
CHLORIDE: 103 mmol/L (ref 96–112)
CO2: 31 mmol/L (ref 19–32)
CREATININE: 1.24 mg/dL (ref 0.50–1.35)
Calcium: 9 mg/dL (ref 8.4–10.5)
GFR calc Af Amer: 76 mL/min — ABNORMAL LOW (ref >90)
GFR calc non Af Amer: 65 mL/min — ABNORMAL LOW (ref >90)
GLUCOSE: 100 mg/dL — AB (ref 70–99)
Potassium: 3.4 mmol/L — ABNORMAL LOW (ref 3.5–5.1)
SODIUM: 138 mmol/L (ref 135–145)

## 2015-01-26 LAB — CBC
HCT: 43.9 % (ref 39.0–52.0)
Hemoglobin: 14.9 g/dL (ref 13.0–17.0)
MCH: 27.6 pg (ref 26.0–34.0)
MCHC: 33.9 g/dL (ref 30.0–36.0)
MCV: 81.3 fL (ref 78.0–100.0)
PLATELETS: DECREASED 10*3/uL (ref 150–400)
RBC: 5.4 MIL/uL (ref 4.22–5.81)
RDW: 13.8 % (ref 11.5–15.5)
WBC: 2.7 10*3/uL — AB (ref 4.0–10.5)

## 2015-01-26 MED ORDER — REGADENOSON 0.4 MG/5ML IV SOLN
0.4000 mg | Freq: Once | INTRAVENOUS | Status: AC
  Administered 2015-01-26: 0.4 mg via INTRAVENOUS
  Filled 2015-01-26: qty 5

## 2015-01-26 MED ORDER — SODIUM CHLORIDE 0.9 % IJ SOLN
80.0000 mg | INTRAVENOUS | Status: AC
  Administered 2015-01-26: 80 mg via INTRAVENOUS
  Filled 2015-01-26: qty 3.2

## 2015-01-26 MED ORDER — TECHNETIUM TC 99M SESTAMIBI GENERIC - CARDIOLITE
30.0000 | Freq: Once | INTRAVENOUS | Status: AC | PRN
  Administered 2015-01-26: 30 via INTRAVENOUS

## 2015-01-26 MED ORDER — TECHNETIUM TC 99M SESTAMIBI GENERIC - CARDIOLITE
10.0000 | Freq: Once | INTRAVENOUS | Status: AC | PRN
  Administered 2015-01-26: 10 via INTRAVENOUS

## 2015-01-26 MED ORDER — POTASSIUM CHLORIDE CRYS ER 20 MEQ PO TBCR
40.0000 meq | EXTENDED_RELEASE_TABLET | Freq: Once | ORAL | Status: AC
  Administered 2015-01-26: 40 meq via ORAL
  Filled 2015-01-26: qty 2

## 2015-01-26 MED ORDER — REGADENOSON 0.4 MG/5ML IV SOLN
INTRAVENOUS | Status: AC
  Administered 2015-01-26: 0.4 mg via INTRAVENOUS
  Filled 2015-01-26: qty 5

## 2015-01-26 NOTE — Progress Notes (Signed)
Pt given 1 SL nitro at this time for CP; pt states nitro relieved pain; pt stated he had intense chest pain during stress test this AM; will cont. To monitor.

## 2015-01-26 NOTE — Progress Notes (Signed)
Patient Name: Chad [name] Date of Encounter: 01/26/2015     Active Problems:   Unstable angina    SUBJECTIVE  Seen in nuclear stress lab for stress test. No further chest pain since last night. He did have some chest pain during lexiscan infusion.   CURRENT MEDS . amLODipine  5 mg Oral Daily  . aspirin EC  81 mg Oral Daily  . carvedilol  25 mg Oral BID WC  . clopidogrel  75 mg Oral Daily  . enoxaparin (LOVENOX) injection  40 mg Subcutaneous Q24H  . ezetimibe  10 mg Oral Daily  . hydrALAZINE  50 mg Oral TID    OBJECTIVE  Filed Vitals:   01/25/15 1830 01/25/15 2100 01/26/15 0300 01/26/15 0847  BP: 130/73 131/77 139/83 138/75  Pulse: 78 66 66   Temp:  97.7 F (36.5 C) 97.9 F (36.6 C)   TempSrc:  Oral Oral   Resp:  18 18   Height:      Weight:      SpO2:  100% 99%     Intake/Output Summary (Last 24 hours) at 01/26/15 0850 Last data filed at 01/26/15 0400  Gross per 24 hour  Intake    364 ml  Output   1525 ml  Net  -1161 ml   Filed Weights   01/25/15 0500 01/25/15 0819  Weight: 158 lb 1.1 oz (71.7 kg) 157 lb 10.1 oz (71.5 kg)    PHYSICAL EXAM  General: Pleasant, NAD. Neuro: Alert and oriented X 3. Moves all extremities spontaneously. Psych: Normal affect. HEENT:  Normal  Neck: Supple without bruits or JVD. Lungs:  Resp regular and unlabored, CTA. Heart: RRR no s3, s4, or murmurs. Abdomen: Soft, non-tender, non-distended, BS + x 4.  Extremities: No clubbing, cyanosis or edema. DP/PT/Radials 2+ and equal bilaterally.  Accessory Clinical Findings  CBC  Recent Labs  01/25/15 0448  WBC 3.3*  HGB 13.3  HCT 40.2  MCV 81.4  PLT 007*   Basic Metabolic Panel  Recent Labs  01/25/15 0448  NA 140  K 3.4*  CL 104  CO2 32  GLUCOSE 107*  BUN 14  CREATININE 1.20  CALCIUM 8.8   Cardiac Enzymes  Recent Labs  01/25/15 1105 01/25/15 1728 01/25/15 2244  TROPONINI <0.03 <0.03 <0.03    TELE  NSR   Radiology/Studies  Dg Chest  Portable 1 View  01/25/2015   CLINICAL DATA:  Chest pain. History of aortic dissection post repair.  EXAM: PORTABLE CHEST - 1 VIEW  COMPARISON:  Radiograph 10/10/2014, CT 10/11/2014  FINDINGS: Patient is post median sternotomy. Left-sided pacemaker remains in place. Cardiomegaly is not significantly changed allowing for differences in technique. Aneurysmal dilatation of the aortic arch appears similar to prior radiograph. Lower lung volumes from prior. No consolidation. No pleural effusion or pneumothorax. Pulmonary vasculature is normal. Osseous structures are intact.  IMPRESSION: No evidence of acute pulmonary process. Stable cardiomegaly and mediastinal contours.   Electronically Signed   By: Jeb Levering M.D.   On: 01/25/2015 05:12    ASSESSMENT AND PLAN Issac Sangha is a 54 y.o. male with ICM EF 25%, s/p CABG in 2006 and subsequent PCI with DES in 2011, ascending Ao Dissection with repair in 2010, who presented yesterday with angina at rest.  Chest pain-  -- Troponin neg x3. ECG with no acute ST or TW changes. -- Seen by Dr. Haroldine Laws yesterday who states that "mr Lattimire has suffered from chronic CP. Admitted with recurrent CP at rest  that was worse than baseline but unlike previous MI. Given chronic descending Ao dissection and solitary kidney, I am reluctant to cath him without objective evidence of ischemia. Will proceed with Lexiscan Myoview in am. Stop heparin." -- Underwent lexiscan myoview today. Tolerated procedure well. Await nuclear images. If negative he can likley be discharged home.  -- Continue ASA/plavix, BB   Ischemic cardiomyopathy EF 30% -- This appears stable on exam and by symptoms -- TTE pending. -- Continue carvedilol 25 mg twice a day -- Continue hydralazine 75 mg TID and Imdur 30 mg BID. -- Not on ACEI due to CKD and solitary kidney.  ? 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  -- Device interrogation today.    Chad Pimple PA-C  Pager  [phone]   Patient seen and examined. Agree with assessment and plan. Patient had significant chest pain during lexiscan study. Await images and results prior to final recommendations. To have ICD interrogated today.  Troy Sine, MD, [facility] 01/26/2015 12:26 PM

## 2015-01-26 NOTE — Progress Notes (Signed)
  Echocardiogram 2D Echocardiogram has been performed.  Chad [name] 01/26/2015, 11:00 AM

## 2015-01-26 NOTE — Progress Notes (Signed)
Utilization review completed.  

## 2015-01-27 ENCOUNTER — Encounter (HOSPITAL_COMMUNITY): Payer: Self-pay | Admitting: *Deleted

## 2015-01-27 DIAGNOSIS — R072 Precordial pain: Secondary | ICD-10-CM

## 2015-01-27 DIAGNOSIS — I2510 Atherosclerotic heart disease of native coronary artery without angina pectoris: Secondary | ICD-10-CM

## 2015-01-27 MED ORDER — POTASSIUM CHLORIDE CRYS ER 20 MEQ PO TBCR
40.0000 meq | EXTENDED_RELEASE_TABLET | Freq: Once | ORAL | Status: AC
  Administered 2015-01-27: 40 meq via ORAL
  Filled 2015-01-27: qty 2

## 2015-01-27 MED ORDER — POTASSIUM CHLORIDE CRYS ER 20 MEQ PO TBCR
20.0000 meq | EXTENDED_RELEASE_TABLET | Freq: Three times a day (TID) | ORAL | Status: AC
  Administered 2015-01-27 (×3): 20 meq via ORAL
  Filled 2015-01-27 (×3): qty 1

## 2015-01-27 MED ORDER — ISOSORBIDE MONONITRATE ER 60 MG PO TB24
60.0000 mg | ORAL_TABLET | Freq: Two times a day (BID) | ORAL | Status: DC
Start: 2015-01-27 — End: 2015-01-28
  Administered 2015-01-27 – 2015-01-28 (×3): 60 mg via ORAL
  Filled 2015-01-27 (×4): qty 1

## 2015-01-27 NOTE — Progress Notes (Signed)
CARDIAC REHAB PHASE I   PRE:  Rate/Rhythm: 71 SR  BP:  Supine:   Sitting: 134/75  Standing:    SaO2: 97%RA  MODE:  Ambulation: 550 ft   POST:  Rate/Rhythm: 74  BP:  Supine:   Sitting: 138/79  Standing:    SaO2: 99%RA 1420-1451 Came earlier and pt having conference call. Pt walked 550 ft with steady gait. He was not having CP right before walk but tightness at 2 or 3 on scale 1-10 after walk which pt stated was not normal for him. 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 Asked pt if he felt he needed a NTG tablet and he stated no.   Graylon Good, RN BSN  01/27/2015 2:46 PM

## 2015-01-27 NOTE — Progress Notes (Signed)
Patient Name: Chad [name] Date of Encounter: 01/27/2015  Active Problems:   Unstable angina   Primary Cardiologist: Dr. Haroldine Laws  Patient Profile: 54 yo male w/ hx ICM, EF 25%,  CABG 2006 + PCI 2011, Ao repair 2010, admitted 02/01 w/ angina, out of SL NTG at home.  SUBJECTIVE: Having chest pain now, > 5/10, was not having but 1-2 x month until last few weeks, then began increasing in intensity/frequency.  OBJECTIVE Filed Vitals:   01/26/15 1507 01/26/15 1700 01/26/15 2016 01/27/15 0341  BP: 130/80 139/85 140/81 120/67  Pulse: 67 74 72 67  Temp:   98.3 F (36.8 C) 98.2 F (36.8 C)  TempSrc:   Oral Oral  Resp: 18  18 20   Height:      Weight:      SpO2: 100%  100% 96%    Intake/Output Summary (Last 24 hours) at 01/27/15 0845 Last data filed at 01/27/15 0743  Gross per 24 hour  Intake    900 ml  Output   1000 ml  Net   -100 ml   Filed Weights   01/25/15 0500 01/25/15 0819  Weight: 158 lb 1.1 oz (71.7 kg) 157 lb 10.1 oz (71.5 kg)    PHYSICAL EXAM General: Well developed, well nourished, male in no acute distress. Head: Normocephalic, atraumatic.  Neck: Supple without bruits, JVD not elevated. Lungs:  Resp regular and unlabored, few rales bases. Heart: RRR, S1, S2, no S3, S4, +SEM, +DM  murmur; no rub. Abdomen: Soft, non-tender, non-distended, BS + x 4.  Extremities: No clubbing, cyanosis, edema.  Neuro: Alert and oriented X 3. Moves all extremities spontaneously. Psych: Normal affect.  LABS: CBC: Recent Labs  01/25/15 0448 01/26/15 1103  WBC 3.3* 2.7*  HGB 13.3 14.9  HCT 40.2 43.9  MCV 81.4 81.3  PLT 134* PLATELET CLUMPS NOTED ON SMEAR, COUNT APPEARS DECREASED   Basic Metabolic Panel: Recent Labs  01/25/15 0448 01/26/15 1103  NA 140 138  K 3.4* 3.4*  CL 104 103  CO2 32 31  GLUCOSE 107* 100*  BUN 14 11  CREATININE 1.20 1.24  CALCIUM 8.8 9.0   Cardiac Enzymes: Recent Labs  01/25/15 1105 01/25/15 1728 01/25/15 2244  TROPONINI  <0.03 <0.03 <0.03    Recent Labs  01/25/15 0451  TROPIPOC 0.01   TELE:   SR, 3 bt run NSVT     Echo:  01/26/2015 Study Conclusions - Left ventricle: The cavity size was severely dilated. Wall thickness was increased in a pattern of mild LVH. There was mild concentric hypertrophy. Systolic function was severely reduced. The estimated ejection fraction was 26%, by Simpson&'s biplane method. Based on an LVOT diameter of 2.7 cm and HR of 64, the estimated CO is 2.8 L/min. Diffuse hypokinesis. Doppler parameters are consistent with pseudonormal left ventricular relaxation (grade 2 diastolic dysfunction). The E/e&' ratio is >20, suggesting elevated LV Filling pressure. Ejection fraction (MOD, 2-plane): 26%. - Aortic valve: Trileaflet. Sclerosis without stenosis. There was mild regurgitation, directed posteriorly which impeds the opening of the anterior mitral leaflet. - Aorta: Aortic root dimension: 45 mm (ED). - Aortic root: The aortic root is dilated. - Mitral valve: Mildly thickened leaflets . There was mild regurgitation. - Left atrium: Severely dilated at 53 ml/m2. - Right ventricle: The cavity size was mildly dilated. Pacer wire or catheter noted in right ventricle. Systolic function is reduced. Lateral annulus peak S velocity: 8.91 cm/s. - Right atrium: The atrium was mildly dilated. - Tricuspid valve:  There was mild regurgitation. - Pulmonary arteries: PA peak pressure: 32 mm Hg (S). - Inferior vena cava: The vessel was dilated. The respirophasic diameter changes were blunted (< 50%), consistent with elevated central venous pressure. Impressions: - Compared to the prior echo in 2013, the EF has further reduced to 26%. Cardiac output is estimated at 2.8 L/min. There is Stage 2 diastolic dysfunction with elevated LV filling pressure. The LA is severely dilated.  Radiology/Studies: Nm Myocar Multi W/spect W/wall Motion / Ef 01/26/2015    CLINICAL DATA:  Chest pain  EXAM: Lexiscan Myovue  TECHNIQUE: The patient received IV Lexiscan .4mg  over 15 seconds. 33.0 mCi of Technetium 9m Sestamibi injected at 30 seconds. Quantitative SPECT images were obtained in the vertical, horizontal and short axis planes after a 45 minute delay. Rest images were obtained with similar planes and delay using 10.2 mCi of Technetium 26m Sestamibi.  FINDINGS: ECG: Resting electrocardiogram shows sinus rhythm; LVH, inferior lateral TWI. Left axis deviation. Resting heart rate 65 and increased to 88. Resting BP 138/75 and increased to 138/79. Patient did complain of chest pain. No ST changes. Study terminated per protocol.  Symptoms:  Patient complained of chest pain with infusion.  RAW Data:  Adequate image acquistion; LVE noted  Quantitiative Gated SPECT EF: 27; global hypokinesis with inferior akinesis; TID 1.04; EDV-340 ml; ESV-250 ml  Perfusion Images: Images were reconstructed in short axis as well as the vertical and horizontal long axis. The stress images reveal a large severe defect in the inferior wall. When compared to the rest images there is slight reversibility.  IMPRESSION: High risk stress nuclear study with chest pain but no new electrocardiographic changes. The scintigraphic results show a large prior inferior infarct with very mild peri-infarct ischemia. The gated ejection fraction was 27% with global hypokinesis and inferior akinesis. Study felt to be high risk because of reduced RV function.  Kirk Ruths   Electronically Signed   By: Kirk Ruths   On: 01/26/2015 15:57   Current Medications:  . amLODipine  5 mg Oral Daily  . aspirin EC  81 mg Oral Daily  . carvedilol  25 mg Oral BID WC  . clopidogrel  75 mg Oral Daily  . enoxaparin (LOVENOX) injection  40 mg Subcutaneous Q24H  . ezetimibe  10 mg Oral Daily  . hydrALAZINE  50 mg Oral TID   . sodium chloride 10 mL/hr at 01/25/15 1000  . nitroGLYCERIN 5 mcg/min (01/25/15 1030)     ASSESSMENT AND PLAN: Chest pain-  -- Troponin neg x3. ECG with no acute ST or TW changes. -- Seen by Dr. Haroldine Laws after admit who recommends no cath without objective evidence of ischemia. -- Underwent Lexiscan Myoview 02/02. Tolerated procedure well. MD to review report -- Continue ASA/plavix, BB   Ischemic cardiomyopathy EF 30% -- EF now 26% by echo, 27% by MV -- Continue carvedilol 25 mg twice a day -- Continue hydralazine 75 mg TID and Imdur 30 mg BID. -- Not on ACEI/ARB due to CKD and solitary kidney.  ? 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  -- Device interrogated, no shocks -- occ short runs NSVT  Hypokalemia -- s/p 40 meq yesterdy --  will supp and follow  Signed, Rosaria Ferries , PA-C 8:45 AM 01/27/2015  Patient seen and examined with Rosaria Ferries, PA-C. We discussed all aspects of the encounter. I agree with the assessment and plan as stated above.   Continues with intermittent CP but ECG and troponins negative. Myoview images reviewed  personally - without significant ischemia. He is at high risk for cath complications due to chronic descending aortic dissection and solitary kidney. Will try Imdur 60 bid and can add Ranexa if needed.Cardiac rehab to ambulate. If CP persists may need to reconsider cath. He and I discussed the risks and indications.   Daniel Bensimhon,MD 9:32 AM

## 2015-01-28 ENCOUNTER — Encounter (HOSPITAL_COMMUNITY): Payer: Self-pay | Admitting: *Deleted

## 2015-01-28 MED ORDER — EZETIMIBE 10 MG PO TABS: 10.0000 mg | ORAL_TABLET | Freq: Every day | ORAL | Status: DC

## 2015-01-28 MED ORDER — NITROGLYCERIN 0.4 MG SL SUBL: 0.4000 mg | SUBLINGUAL_TABLET | SUBLINGUAL | Status: DC | PRN

## 2015-01-28 MED ORDER — POTASSIUM CHLORIDE CRYS ER 20 MEQ PO TBCR
40.0000 meq | EXTENDED_RELEASE_TABLET | Freq: Once | ORAL | Status: AC
  Administered 2015-01-28: 40 meq via ORAL
  Filled 2015-01-28: qty 2

## 2015-01-28 MED ORDER — ASPIRIN 81 MG PO TBEC: 81.0000 mg | DELAYED_RELEASE_TABLET | Freq: Every day | ORAL | Status: DC

## 2015-01-28 MED ORDER — HYDRALAZINE HCL 50 MG PO TABS: 50.0000 mg | ORAL_TABLET | Freq: Three times a day (TID) | ORAL | Status: DC

## 2015-01-28 MED ORDER — POTASSIUM CHLORIDE CRYS ER 20 MEQ PO TBCR: 20.0000 meq | EXTENDED_RELEASE_TABLET | ORAL | Status: DC | PRN

## 2015-01-28 MED ORDER — AMLODIPINE BESYLATE 5 MG PO TABS: 5.0000 mg | ORAL_TABLET | Freq: Every day | ORAL | Status: DC

## 2015-01-28 MED ORDER — MAGNESIUM HYDROXIDE 400 MG/5ML PO SUSP
30.0000 mL | Freq: Every day | ORAL | Status: DC | PRN
  Administered 2015-01-28: 30 mL via ORAL
  Filled 2015-01-28: qty 30

## 2015-01-28 MED ORDER — CLOPIDOGREL BISULFATE 75 MG PO TABS: 75.0000 mg | ORAL_TABLET | Freq: Every day | ORAL | Status: DC

## 2015-01-28 MED ORDER — CARVEDILOL 25 MG PO TABS: 25.0000 mg | ORAL_TABLET | Freq: Two times a day (BID) | ORAL | Status: DC

## 2015-01-28 MED ORDER — ISOSORBIDE MONONITRATE ER 60 MG PO TB24: 60.0000 mg | ORAL_TABLET | Freq: Two times a day (BID) | ORAL | Status: DC

## 2015-01-28 NOTE — Discharge Summary (Signed)
Advanced Heart Failure Team  Discharge Summary   Patient ID: Chad [name] MRN: [id number], DOB/AGE: [birth year] 54 y.o. Admit date: 01/25/2015 D/C date:     01/28/2015   Primary Discharge Diagnoses:  1. Chest pain, acute on chronic 2. Chronic systolic HF with EF 98%  3. H/o aortic dissection s/p repair with chronic descending Ao dissection 4. CKD with solitary kidney 5. CAD s/p CABG 6. Hypertension 7. Leukopenia/thrombocytopenia   Hospital Course:   Chad [name] is a 54 y.o. male with chronic systolic HF due to ICM EF 25%, s/p CABG in 2006 and subsequent PCI with DES in 2011, ascending/descending Ao Dissection with repair in 2010, CKD with solitary kidney and HTN who was admitted for worsening chest pain. He has chronic CP which has been managed medically. Myoview in 2015 without ischemia. Presented to ER on 2/1 with cramping feeling in the center of his chest radiating to his neck and L arm. Different than the pain he has experienced in the past with his dissection and prior to CABG as well. Ran out of NTG at home. In ED he was quite hypertensive in setting of being out of some of his medications. He was quickly started on heparin gtt and NTG by ED team. Chest pain resolved quickly. ECG and troponins were negative.   Given solitary kidney and chronic aortic dissection, I was hesitant to take him to cath without objective signs of ischemia. We discussed risks/indications of cath versus stress testing. Myoview was performed which was unchanged from previous EF 27% large inferolateral scar without significant ischemia. His Imdur was increased to 60 bid with relief. We also discussed the possibility of adding Ranexa 500 bid but we held off for now. His ICD was interrogated with no evidence of ICD firing. His HF was stable.   He will f/u in HF Clinic in 2 weeks. Can consider referral to cardiac rehab at that time for chronic stable angina. Consider referral to hematology for  leukopenia/thrombocytopenia.   Discharge Vitals: Blood pressure 113/59, pulse 64, temperature 98 F (36.7 C), temperature source Oral, resp. rate 16, height 5\' 11"  (1.803 m), weight 71.5 kg (157 lb 10.1 oz), SpO2 98 %.   PHYSICAL EXAM General: Well developed, well nourished, male in no acute distress. Head: Normocephalic, atraumatic. Neck: Supple without bruits, JVD not elevated. Lungs: Resp regular and unlabored, few rales bases. Heart: RRR, S1, S2, no S3, S4, +SEM, +DM murmur; no rub. Abdomen: Soft, non-tender, non-distended, BS + x 4.  Extremities: No clubbing, cyanosis, edema.  Neuro: Alert and oriented X 3. Moves all extremities spontaneously. Psych: Normal affect.  Labs: Lab Results  Component Value Date   WBC 2.7* 01/26/2015   HGB 14.9 01/26/2015   HCT 43.9 01/26/2015   MCV 81.3 01/26/2015   PLT  01/26/2015    PLATELET CLUMPS NOTED ON SMEAR, COUNT APPEARS DECREASED    Recent Labs Lab 01/26/15 1103  NA 138  K 3.4*  CL 103  CO2 31  BUN 11  CREATININE 1.24  CALCIUM 9.0  GLUCOSE 100*   Lab Results  Component Value Date   CHOL 159 10/11/2014   HDL 57 10/11/2014   LDLCALC 92 10/11/2014   TRIG 52 10/11/2014   BNP (last 3 results) No results for input(s): BNP in the last 8760 hours.  ProBNP (last 3 results)  Recent Labs  03/11/14 1829 10/10/14 0638  PROBNP 589.1* 1002.0*     Diagnostic Studies/Procedures   No results found.  Discharge Medications  Medication List    TAKE these medications        amLODipine 5 MG tablet  Commonly known as:  NORVASC  Take 1 tablet (5 mg total) by mouth daily.     aspirin 81 MG EC tablet  Take 1 tablet (81 mg total) by mouth daily.     carvedilol 25 MG tablet  Commonly known as:  COREG  Take 1 tablet (25 mg total) by mouth 2 (two) times daily with a meal.     clopidogrel 75 MG tablet  Commonly known as:  PLAVIX  Take 1 tablet (75 mg total) by mouth daily.     cyclobenzaprine 10 MG tablet   Commonly known as:  FLEXERIL  Take 10 mg by mouth daily as needed for muscle spasms.     ezetimibe 10 MG tablet  Commonly known as:  ZETIA  Take 1 tablet (10 mg total) by mouth daily.     furosemide 40 MG tablet  Commonly known as:  LASIX  Take 1 tablet (40 mg total) by mouth daily as needed for fluid.     hydrALAZINE 50 MG tablet  Commonly known as:  APRESOLINE  Take 1 tablet (50 mg total) by mouth 3 (three) times daily.     isosorbide mononitrate 60 MG 24 hr tablet  Commonly known as:  IMDUR  Take 1 tablet (60 mg total) by mouth 2 (two) times daily.     loratadine 10 MG tablet  Commonly known as:  CLARITIN  Take 1 tablet (10 mg total) by mouth daily as needed for allergies.     nitroGLYCERIN 0.4 MG SL tablet  Commonly known as:  NITROSTAT  Place 1 tablet (0.4 mg total) under the tongue every 5 (five) minutes x 3 doses as needed for chest pain.     OVER THE COUNTER MEDICATION  Take 1 capsule by mouth daily. "Mega Men 50 Plus"     oxycodone 30 MG immediate release tablet  Commonly known as:  ROXICODONE  Take 30 mg by mouth every 8 (eight) hours as needed for pain.     potassium chloride SA 20 MEQ tablet  Commonly known as:  K-DUR,KLOR-CON  Take 1 tablet (20 mEq total) by mouth as needed (takes with lasix).        Disposition   The patient will be discharged in stable condition to home.      Follow-up Information    Follow up with Glori Bickers, MD On 02/15/2015.   Specialty:  Cardiology   Why:  @ 2:20pm    Contact information:   [place] [address] [phone]         Duration of Discharge Encounter: Greater than 35 minutes   [employer]  01/28/2015, 9:58 AM

## 2015-01-29 ENCOUNTER — Encounter (HOSPITAL_COMMUNITY): Payer: Self-pay

## 2015-02-02 ENCOUNTER — Encounter (HOSPITAL_COMMUNITY): Payer: Self-pay | Admitting: *Deleted

## 2015-02-15 ENCOUNTER — Encounter (HOSPITAL_COMMUNITY): Payer: Medicaid Other

## 2015-02-27 IMAGING — CR DG CHEST 2V
2 series · 2 of 2 positions shown · non-contrast
Comparison: 03/11/2014

CLINICAL DATA: 53-year-old with mid left abdominal pain and nausea.
Patient also felt chest discomfort and felt like he was having a
heart attack.

EXAM:
CHEST  2 VIEW

[w chest pa]
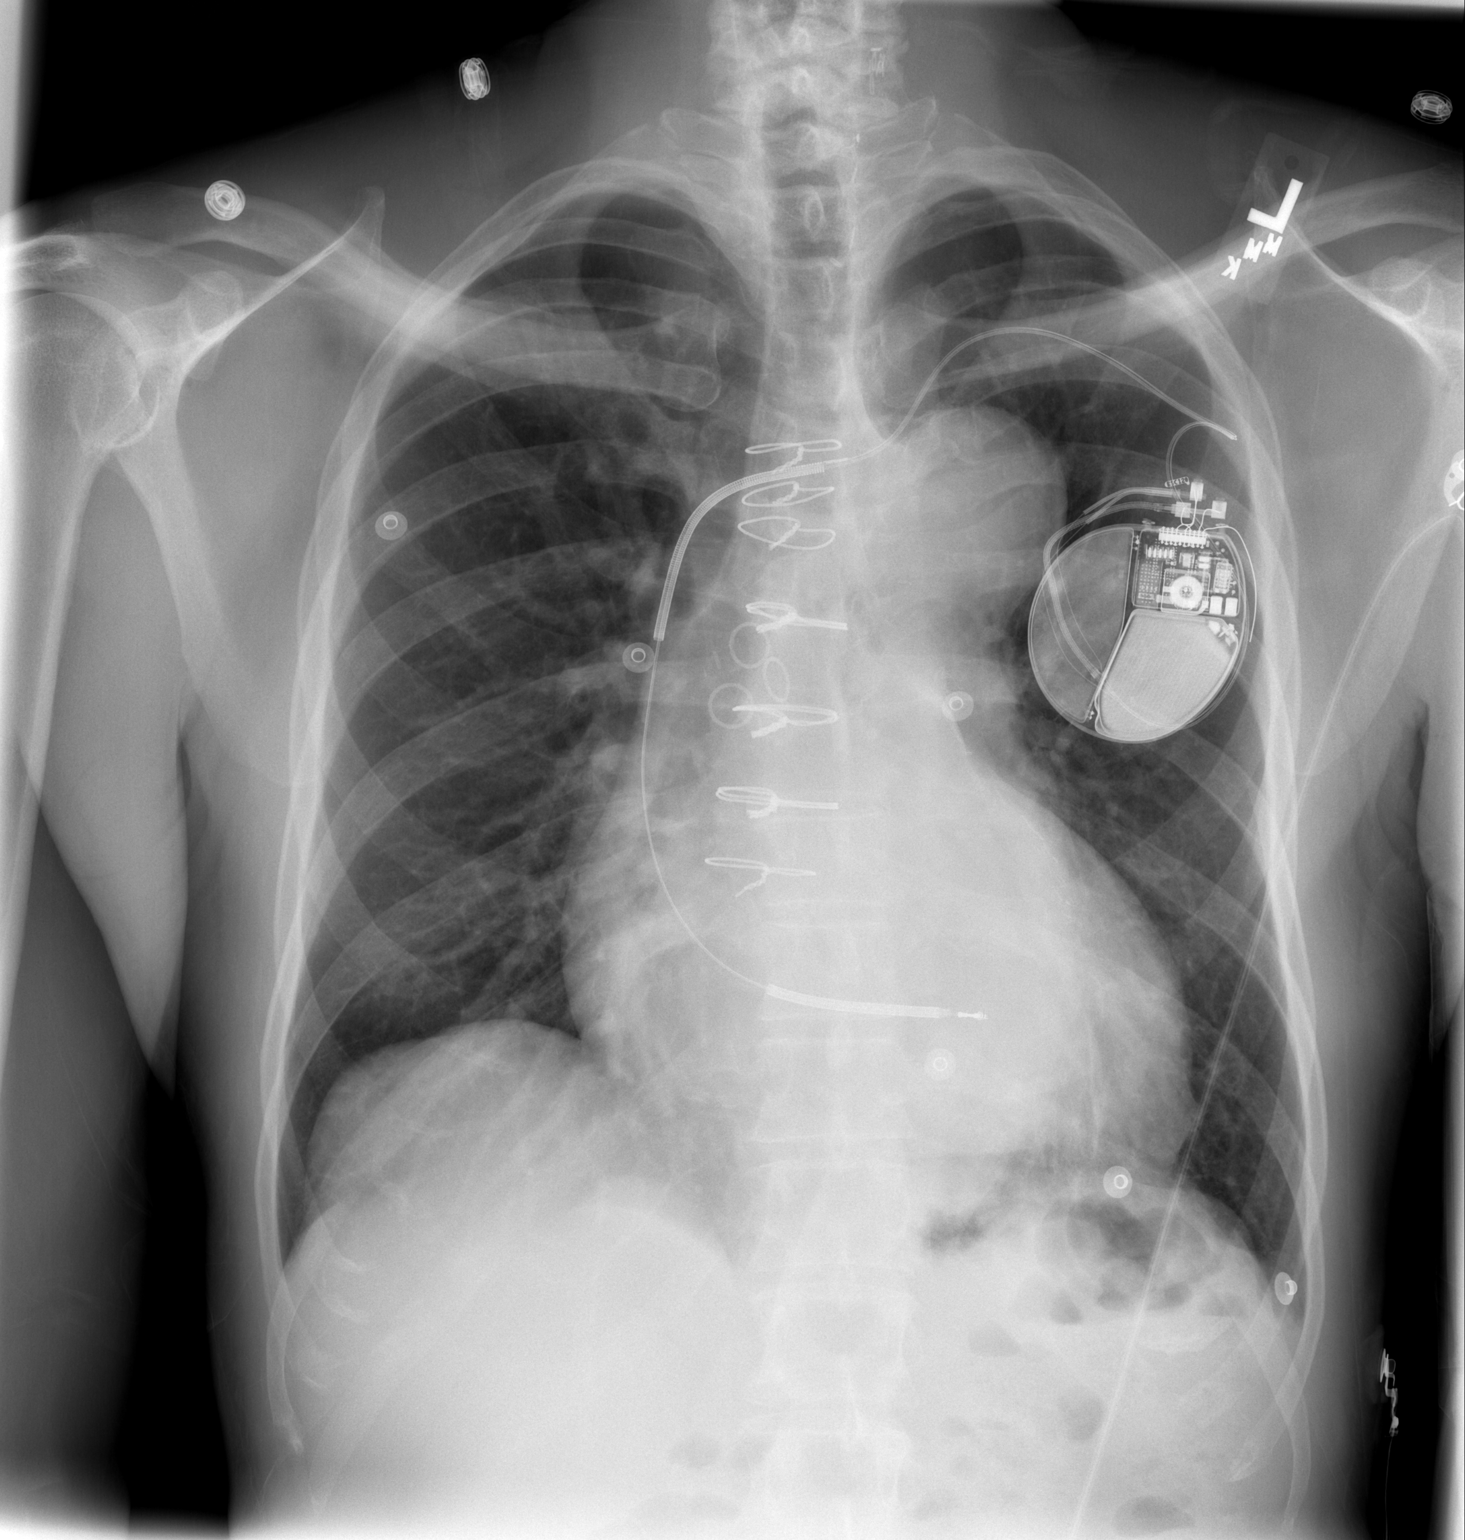

[w chest lat]
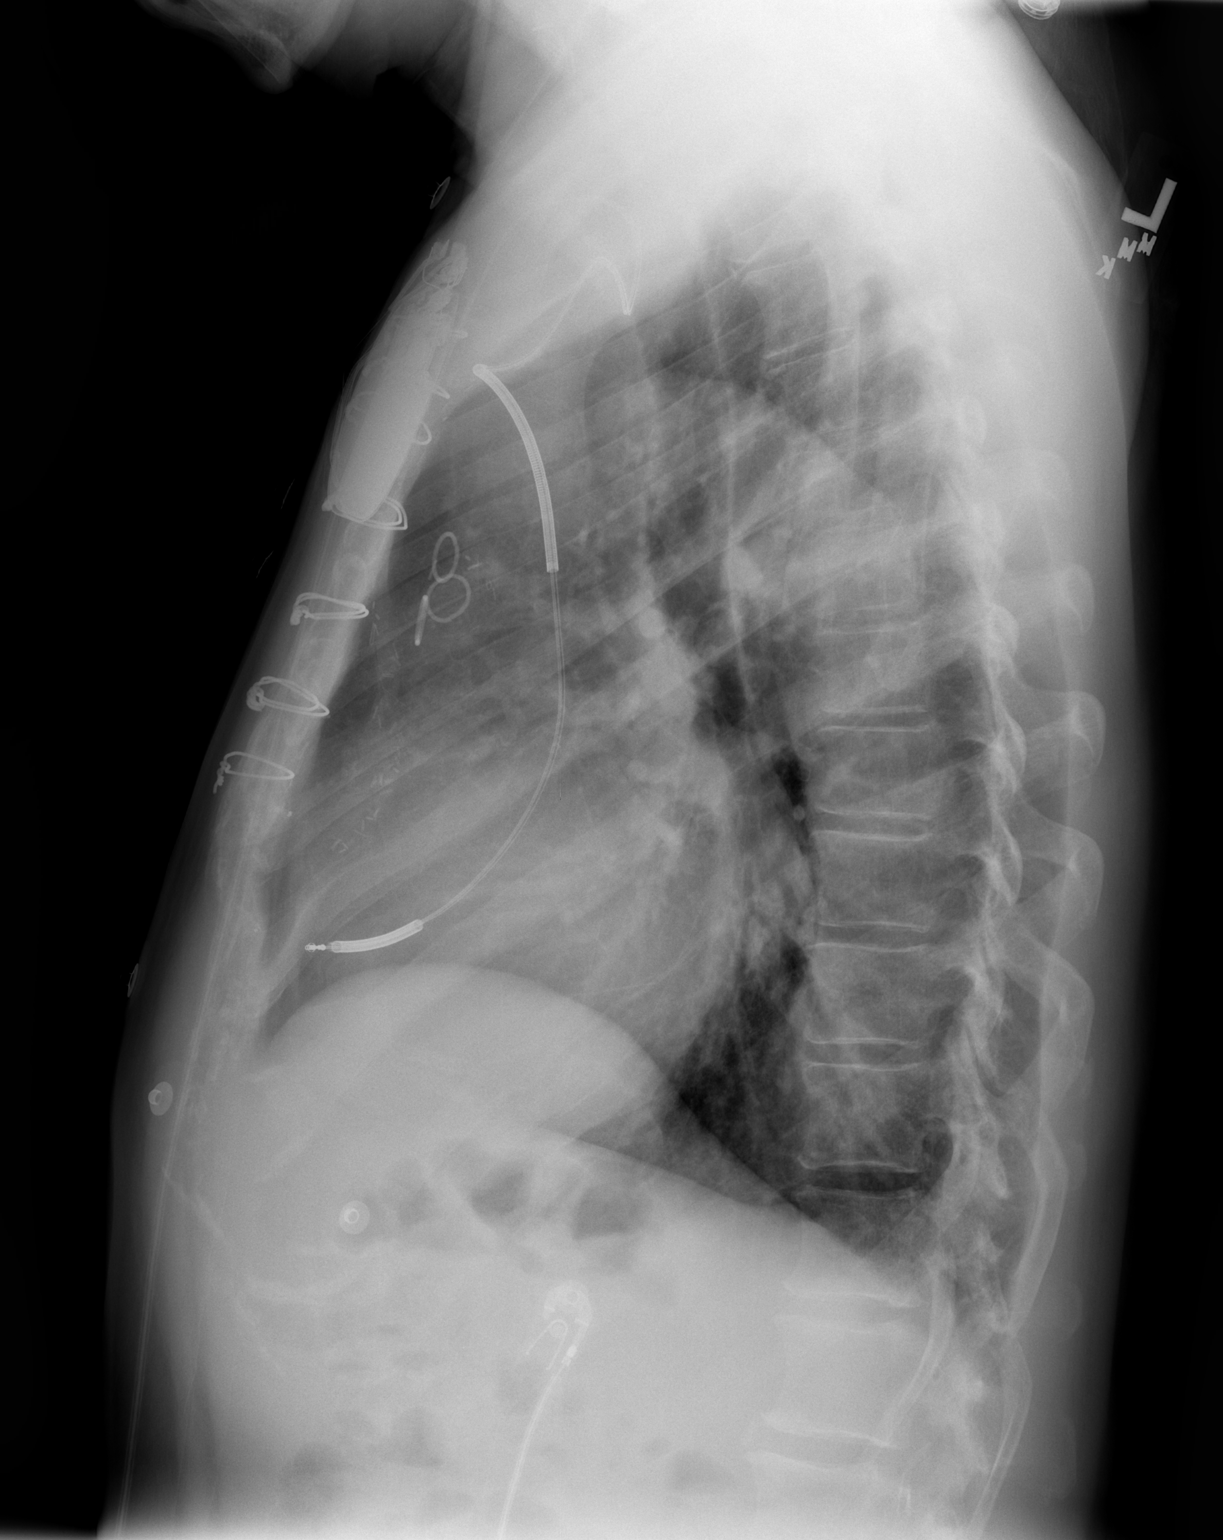

[2 of 2 positions shown; findings below may reference images not displayed]

FINDINGS: There is stable enlargement of the proximal descending thoracic
aorta. Left cardiac ICD is in a stable position. The lungs are clear
without airspace disease or pulmonary edema. Postsurgical changes
compatible with a CABG. Few linear densities at left lung base could
represent mild atelectasis. No pleural effusions.
IMPRESSION: No acute chest abnormality.

Stable enlargement of the proximal descending thoracic aorta.

## 2015-02-28 IMAGING — CT CT ANGIO CHEST
1 of 9 series · 1 of 36 positions shown · IV contrast (Iohexol (Omnipaque 350))
Comparison: 10/15/2013 and chest 06/21/2009

CLINICAL DATA: 53-year-old with acute chest pain, onset yesterday.
Patient received the 13 hr premedication protocol. Known aortic
dissection. Patient had ascending thoracic aortic dissection that
was surgically repaired.

EXAM:
CT ANGIOGRAPHY CHEST, ABDOMEN AND PELVIS
TECHNIQUE: Multidetector CT imaging through the chest, abdomen and pelvis was
performed using the standard protocol during bolus administration of
intravenous contrast. Multiplanar reconstructed images and MIPs were
obtained and reviewed to evaluate the vascular anatomy.
CONTRAST:  70mL OMNIPAQUE IOHEXOL 350 MG/ML SOLN

[Series 400: locator · axial · 0.47mm/px · 1 of 1 slices shown]
[im 1/1  lung]
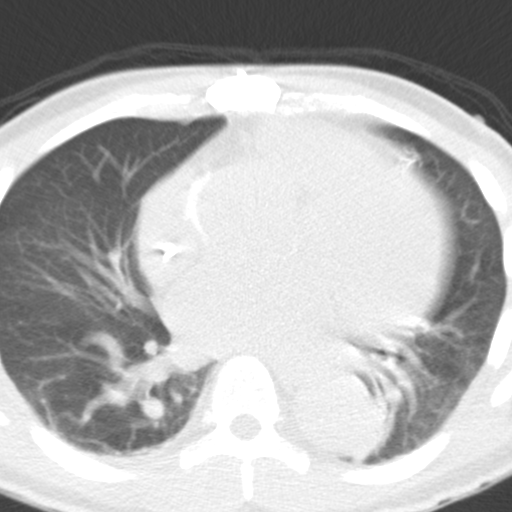

[1 of 36 positions shown; findings below may reference images not displayed]

FINDINGS: CTA CHEST FINDINGS

Patient has a surgical graft involving the ascending thoracic aorta
which is widely patent. The aortic root proximal to the graft
measures 4.3 cm and unchanged. Patient had a coronary bypass
procedure. Again noted is an aortic dissection at the aortic arch
just beyond the surgical graft. The configuration of this dissection
is unchanged. A dissection does not extend into the great vessels
and there is flow in the right innominate artery, left subclavian
artery and left common carotid artery. Aortic arch measures 4.0 cm
in diameter and unchanged. Proximal descending thoracic aorta
measures 4.5 cm and minimally changed. The dissection does extend
down the thoracic aorta. The true lumen appears to be along the
anterior aspect of the aorta. Again noted is an enlarged heart with
calcifications in the native coronary arteries. There is stable
low-density material and calcifications the mediastinum. Overall, no
significant chest lymphadenopathy. There appears to be absent left
thyroid tissue which is unchanged. Pulmonary arteries are not
opacified on this examination.

The trachea and mainstem bronchi are patent. Bibasilar dependent
densities are most compatible with atelectasis. There is no
significant airspace disease or pulmonary edema. No acute bone
abnormality.

Review of the MIP images confirms the above findings.

CTA ABDOMEN AND PELVIS FINDINGS

The aortic dissection extends into the abdominal aorta and
terminates in the distal common iliac arteries bilaterally. The
abdominal aorta at the level of the celiac trunk measures 4.0 cm and
unchanged from 8886 but measured 2.7 cm in 4060. The distal
abdominal aorta measures up to 2.8 cm in AP dimension and previously
measured 2.1 cm in 4060. Dissection involves the proximal celiac
trunk. The dissection involves the proximal SMA and similar to the
previous examination. The main SMA branches are patent. IMA
originates from the true lumen. Right renal artery originates from
the true lumen. Left renal artery originates from the true lumen.
The internal and external iliac arteries are patent bilaterally.
Proximal femoral arteries are patent bilaterally.

Negative for free air. High-density material in the gallbladder is
consistent with stones or sludge. No acute abnormality to the liver,
spleen, pancreas and adrenal glands. Again noted is a chronically
atrophic left kidney. Chronic areas of scarring along the right
kidney lower pole. Small low-density structures in the right kidney
likely represent small cysts. No gross abnormality to the prostate
or urinary bladder. Small amount of free fluid in the pelvis. Large
amount of stool in the colon. Low-density collection just right of
the right inguinal canal. This area roughly measures 3.0 x 2.2 cm
and this could be associated with a right femoral hernia. No acute
bone abnormality. Severe degenerative disc disease in lower lumbar
spine.

Review of the MIP images confirms the above findings.
IMPRESSION: The ascending thoracic aortic graft remains patent and stable
configuration of the aortic dissection distal to the aortic graft.
The dissection involves aortic arch and extends down to the common
iliac arteries.

The size of the upper abdominal aorta has not significantly changed
since 8886 but there has been clear growth since 4060.

Small amount of free fluid in the pelvis and a small amount of fluid
in the right groin. The right groin fluid could be within a right
femoral hernia. Etiology for the intra-abdominal free fluid is
nonspecific but could be related to an inflammatory process process
such as enteritis.

Question gallstones or gallbladder sludge. No evidence for acute
gallbladder inflammation.

## 2015-02-28 IMAGING — US US ABDOMEN COMPLETE
1 series · 13 of 25 positions shown · non-contrast
Comparison: CTA of the chest, abdomen and pelvis dated 10/11/2014

CLINICAL DATA: Abdominal pain.  Known aortic dissection.

EXAM:
ULTRASOUND ABDOMEN COMPLETE

[Series 1: us abdomen complete · 0.18mm/px · 13 of 93 slices shown]
[im 1/93]
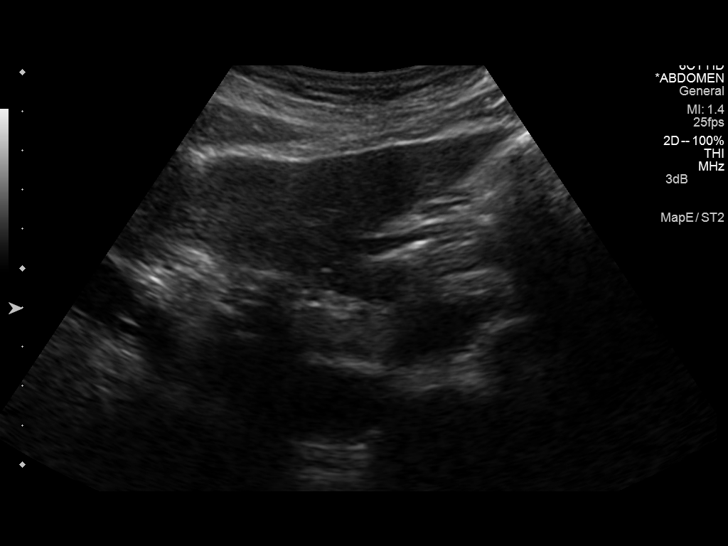
[im 8/93]
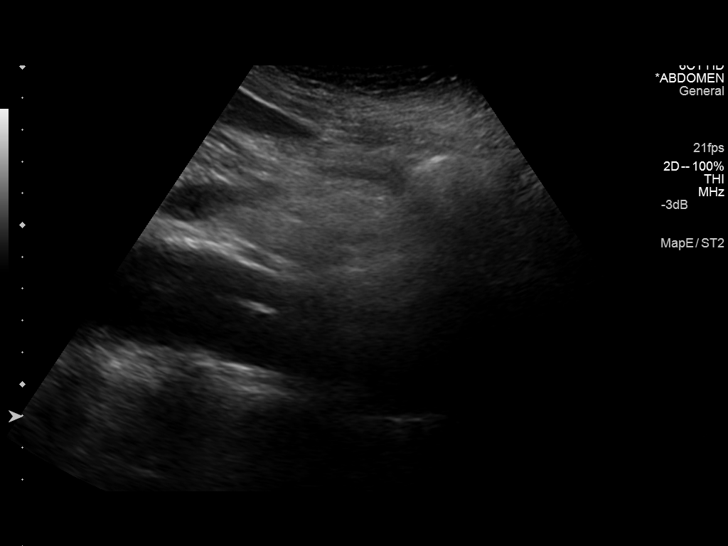
[im 16/93]
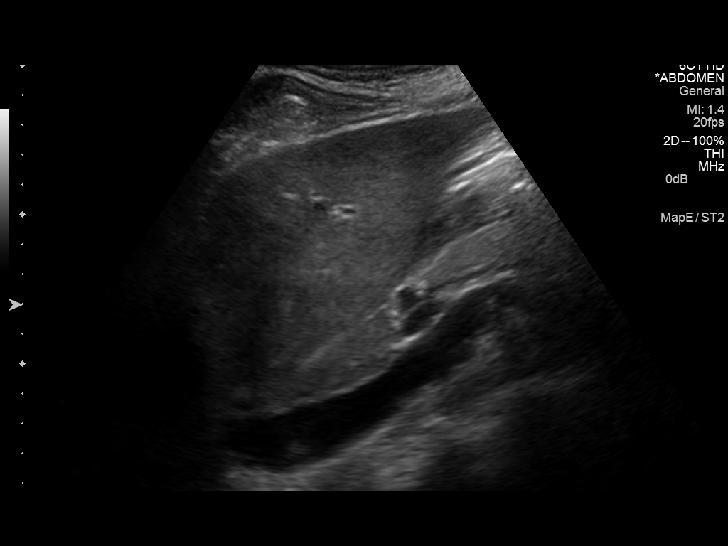
[im 24/93]
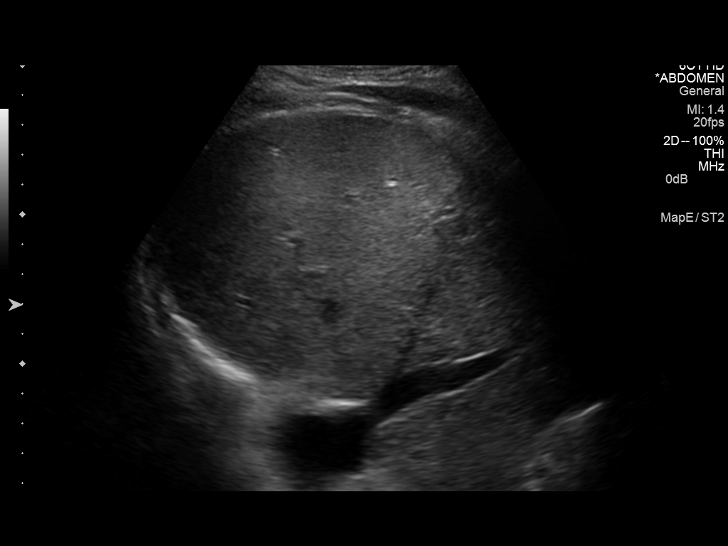
[im 31/93]
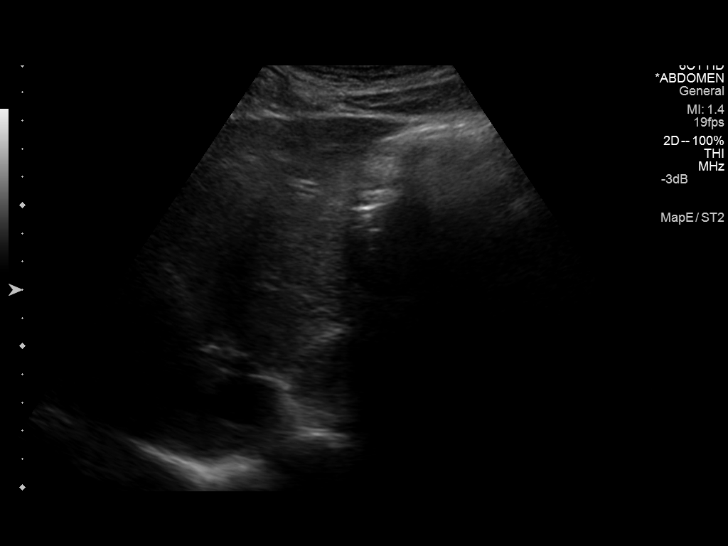
[im 39/93]
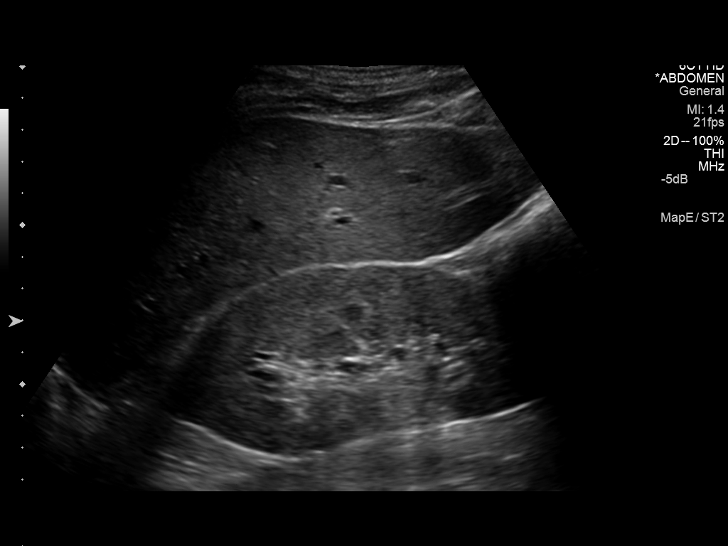
[im 47/93]
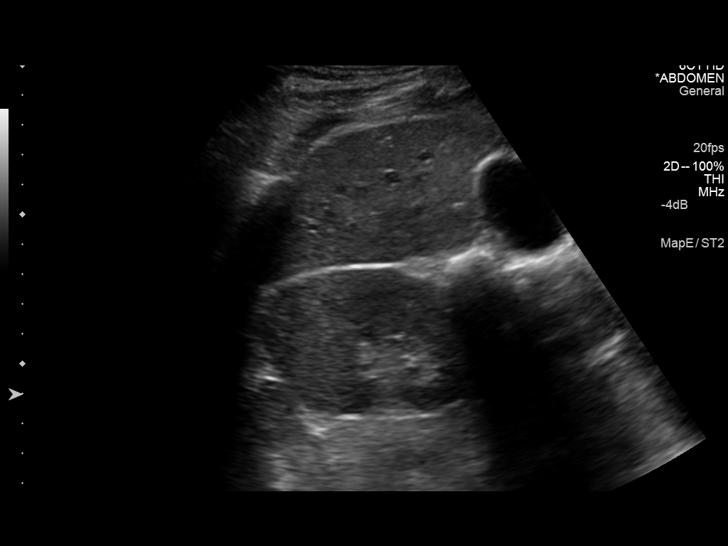
[im 54/93]
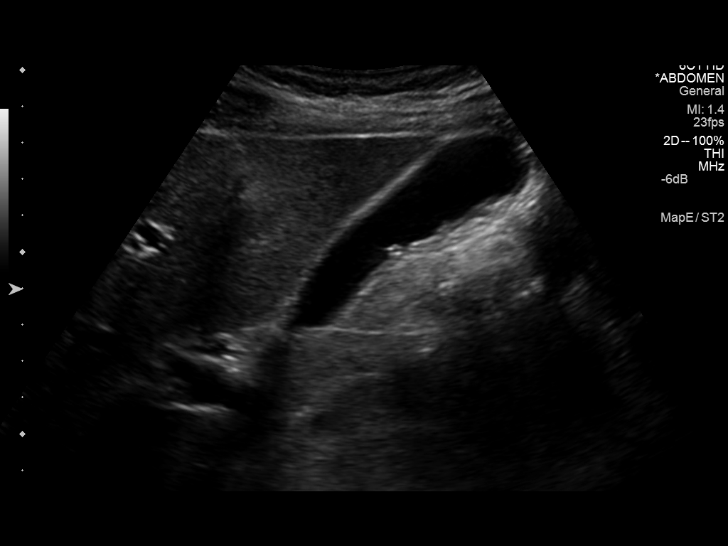
[im 62/93]
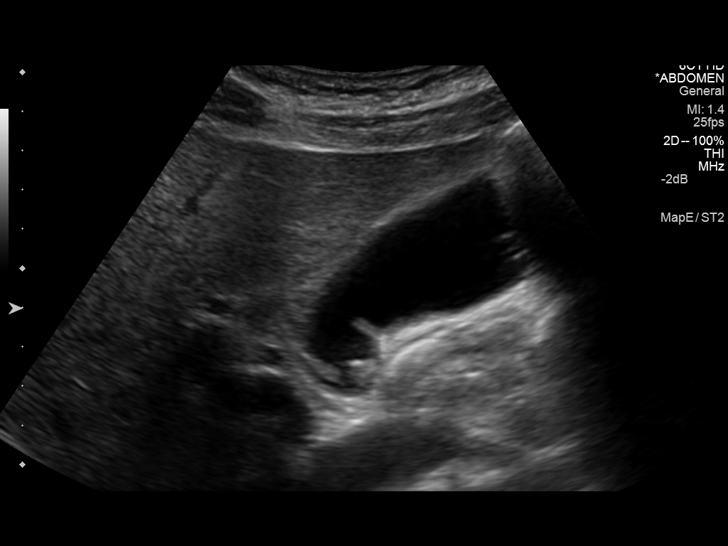
[im 70/93]
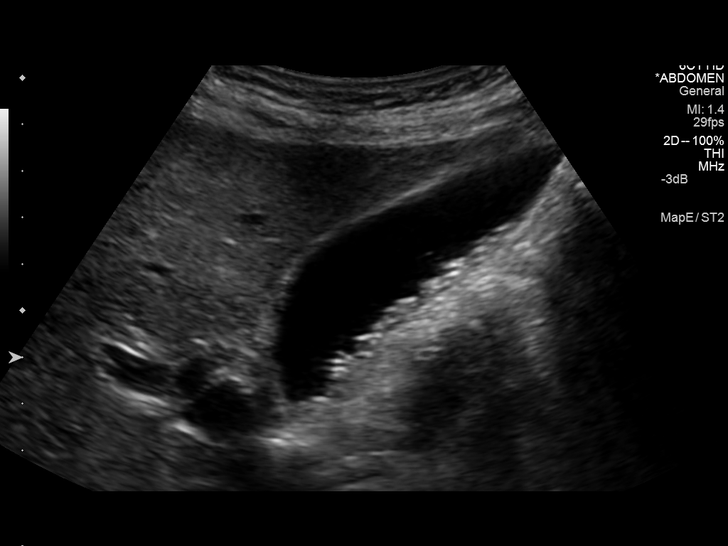
[im 77/93]
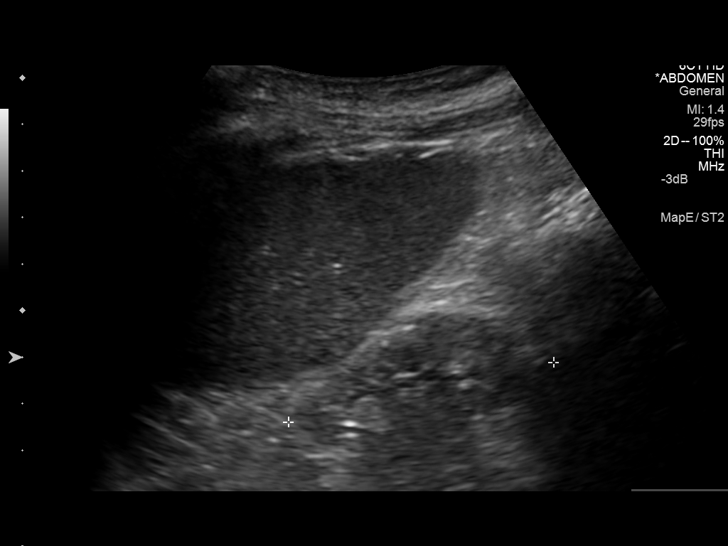
[im 85/93]
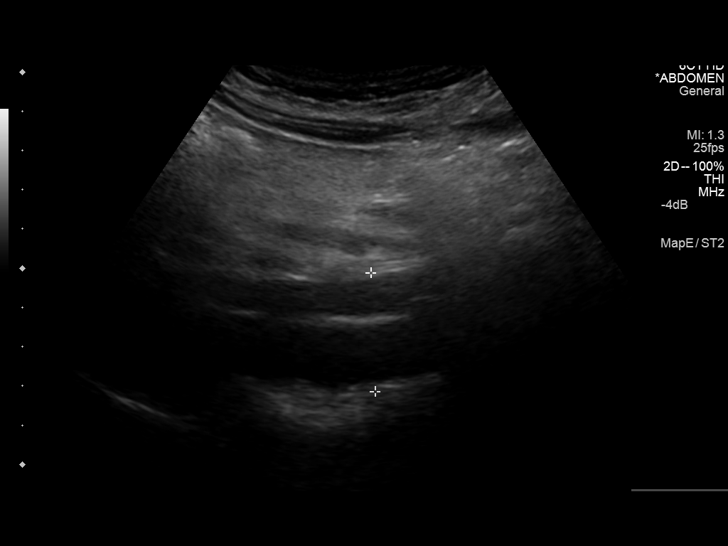
[im 93/93]
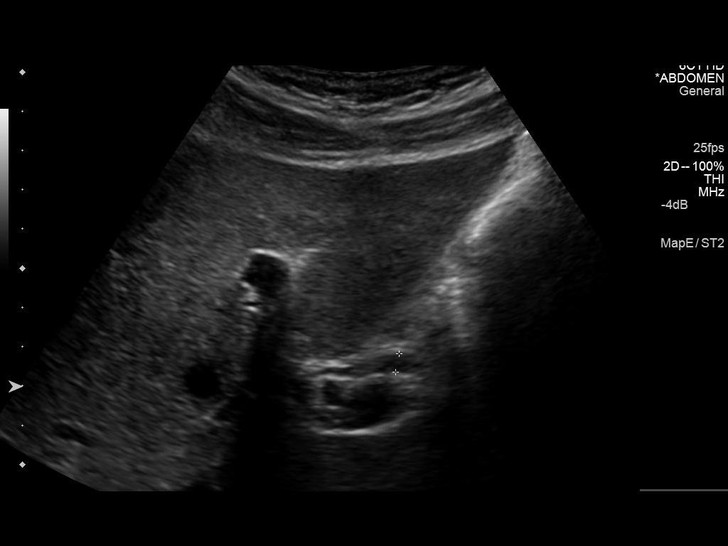

[13 of 25 positions shown; findings below may reference images not displayed]

FINDINGS: Gallbladder: Mobile echogenic structures in the gallbladder are most
compatible with stones. Largest stone measures 0.7 cm. No
significant gallbladder wall thickening. Reportedly, the patient is
tender over the gallbladder.

Common bile duct: Diameter: 0.5 cm.

Liver: No focal lesion identified. Within normal limits in
parenchymal echogenicity.

IVC: No abnormality visualized.

Pancreas: Visualized portion unremarkable.

Spleen: 7.1 cm in length and normal echogenicity.

Right Kidney: Length: 12.6 cm. Echogenicity within normal limits. No
mass or hydronephrosis visualized.

Left Kidney: Length: 6.0 cm. Left kidney is atrophic and poorly
characterized.

Abdominal aorta: There is a known abdominal aortic dissection.
Dissection in the common iliac arteries bilaterally. This is better
characterized on the recent CTA examination.

Other findings: None.
IMPRESSION: Cholelithiasis without biliary dilatation. Reportedly, the patient
is tender over the gallbladder. These findings are equivocal for
acute cholecystitis.

Known abdominal aortic dissection.

## 2015-03-06 ENCOUNTER — Emergency Department (HOSPITAL_COMMUNITY): Payer: Medicaid Other

## 2015-03-06 ENCOUNTER — Emergency Department (HOSPITAL_COMMUNITY)
Admission: EM | Admit: 2015-03-06 | Discharge: 2015-03-06 | Disposition: A | Discharge status: Home / Self Care | Payer: Medicaid Other | Attending: Emergency Medicine | Admitting: Emergency Medicine

## 2015-03-06 ENCOUNTER — Encounter (HOSPITAL_COMMUNITY): Payer: Self-pay | Admitting: Emergency Medicine

## 2015-03-06 DIAGNOSIS — Z79899 Other long term (current) drug therapy: Secondary | ICD-10-CM | POA: Insufficient documentation

## 2015-03-06 DIAGNOSIS — R079 Chest pain, unspecified: Secondary | ICD-10-CM | POA: Insufficient documentation

## 2015-03-06 DIAGNOSIS — N189 Chronic kidney disease, unspecified: Secondary | ICD-10-CM | POA: Insufficient documentation

## 2015-03-06 DIAGNOSIS — Z91040 Latex allergy status: Secondary | ICD-10-CM | POA: Diagnosis not present

## 2015-03-06 DIAGNOSIS — I2510 Atherosclerotic heart disease of native coronary artery without angina pectoris: Secondary | ICD-10-CM | POA: Diagnosis not present

## 2015-03-06 DIAGNOSIS — M109 Gout, unspecified: Secondary | ICD-10-CM | POA: Insufficient documentation

## 2015-03-06 DIAGNOSIS — I5022 Chronic systolic (congestive) heart failure: Secondary | ICD-10-CM | POA: Insufficient documentation

## 2015-03-06 DIAGNOSIS — Z7982 Long term (current) use of aspirin: Secondary | ICD-10-CM | POA: Insufficient documentation

## 2015-03-06 DIAGNOSIS — E785 Hyperlipidemia, unspecified: Secondary | ICD-10-CM | POA: Diagnosis not present

## 2015-03-06 DIAGNOSIS — Z951 Presence of aortocoronary bypass graft: Secondary | ICD-10-CM | POA: Diagnosis not present

## 2015-03-06 DIAGNOSIS — R1011 Right upper quadrant pain: Secondary | ICD-10-CM | POA: Diagnosis present

## 2015-03-06 DIAGNOSIS — K8050 Calculus of bile duct without cholangitis or cholecystitis without obstruction: Secondary | ICD-10-CM | POA: Diagnosis not present

## 2015-03-06 DIAGNOSIS — Z862 Personal history of diseases of the blood and blood-forming organs and certain disorders involving the immune mechanism: Secondary | ICD-10-CM | POA: Insufficient documentation

## 2015-03-06 DIAGNOSIS — Z85850 Personal history of malignant neoplasm of thyroid: Secondary | ICD-10-CM | POA: Diagnosis not present

## 2015-03-06 DIAGNOSIS — Z9889 Other specified postprocedural states: Secondary | ICD-10-CM | POA: Insufficient documentation

## 2015-03-06 DIAGNOSIS — Z7901 Long term (current) use of anticoagulants: Secondary | ICD-10-CM | POA: Diagnosis not present

## 2015-03-06 DIAGNOSIS — I129 Hypertensive chronic kidney disease with stage 1 through stage 4 chronic kidney disease, or unspecified chronic kidney disease: Secondary | ICD-10-CM | POA: Diagnosis not present

## 2015-03-06 DIAGNOSIS — Z95810 Presence of automatic (implantable) cardiac defibrillator: Secondary | ICD-10-CM | POA: Insufficient documentation

## 2015-03-06 DIAGNOSIS — R109 Unspecified abdominal pain: Secondary | ICD-10-CM

## 2015-03-06 LAB — COMPREHENSIVE METABOLIC PANEL
ALT: 38 U/L (ref 0–53)
AST: 84 U/L — AB (ref 0–37)
Albumin: 4 g/dL (ref 3.5–5.2)
Alkaline Phosphatase: 54 U/L (ref 39–117)
Anion gap: 8 (ref 5–15)
BUN: 17 mg/dL (ref 6–23)
CALCIUM: 8.9 mg/dL (ref 8.4–10.5)
CO2: 25 mmol/L (ref 19–32)
Chloride: 107 mmol/L (ref 96–112)
Creatinine, Ser: 1.59 mg/dL — ABNORMAL HIGH (ref 0.50–1.35)
GFR calc Af Amer: 56 mL/min — ABNORMAL LOW (ref >90)
GFR calc non Af Amer: 48 mL/min — ABNORMAL LOW (ref >90)
Glucose, Bld: 167 mg/dL — ABNORMAL HIGH (ref 70–99)
Potassium: 4 mmol/L (ref 3.5–5.1)
SODIUM: 140 mmol/L (ref 135–145)
TOTAL PROTEIN: 6.7 g/dL (ref 6.0–8.3)
Total Bilirubin: 0.7 mg/dL (ref 0.3–1.2)

## 2015-03-06 LAB — CBC WITH DIFFERENTIAL/PLATELET
BASOS ABS: 0 10*3/uL (ref 0.0–0.1)
BASOS PCT: 0 % (ref 0–1)
EOS PCT: 6 % — AB (ref 0–5)
Eosinophils Absolute: 0.2 10*3/uL (ref 0.0–0.7)
HCT: 38.9 % — ABNORMAL LOW (ref 39.0–52.0)
Hemoglobin: 12.9 g/dL — ABNORMAL LOW (ref 13.0–17.0)
LYMPHS ABS: 1.1 10*3/uL (ref 0.7–4.0)
LYMPHS PCT: 36 % (ref 12–46)
MCH: 27 pg (ref 26.0–34.0)
MCHC: 33.2 g/dL (ref 30.0–36.0)
MCV: 81.6 fL (ref 78.0–100.0)
Monocytes Absolute: 0.3 10*3/uL (ref 0.1–1.0)
Monocytes Relative: 10 % (ref 3–12)
NEUTROS ABS: 1.4 10*3/uL — AB (ref 1.7–7.7)
Neutrophils Relative %: 48 % (ref 43–77)
Platelets: 126 10*3/uL — ABNORMAL LOW (ref 150–400)
RBC: 4.77 MIL/uL (ref 4.22–5.81)
RDW: 14 % (ref 11.5–15.5)
WBC: 3 10*3/uL — AB (ref 4.0–10.5)

## 2015-03-06 LAB — I-STAT TROPONIN, ED
Troponin i, poc: 0 ng/mL (ref 0.00–0.08)
Troponin i, poc: 0 ng/mL (ref 0.00–0.08)

## 2015-03-06 LAB — URINALYSIS, ROUTINE W REFLEX MICROSCOPIC
Bilirubin Urine: NEGATIVE
GLUCOSE, UA: NEGATIVE mg/dL
HGB URINE DIPSTICK: NEGATIVE
KETONES UR: NEGATIVE mg/dL
Leukocytes, UA: NEGATIVE
Nitrite: NEGATIVE
Protein, ur: NEGATIVE mg/dL
Specific Gravity, Urine: 1.021 (ref 1.005–1.030)
UROBILINOGEN UA: 1 mg/dL (ref 0.0–1.0)
pH: 6 (ref 5.0–8.0)

## 2015-03-06 LAB — LIPASE, BLOOD: Lipase: 25 U/L (ref 11–59)

## 2015-03-06 MED ORDER — MORPHINE SULFATE 4 MG/ML IJ SOLN
4.0000 mg | Freq: Once | INTRAMUSCULAR | Status: AC
  Administered 2015-03-06: 4 mg via INTRAVENOUS
  Filled 2015-03-06: qty 1

## 2015-03-06 MED ORDER — HYDROCODONE-ACETAMINOPHEN 5-325 MG PO TABS: 1.0000 | ORAL_TABLET | ORAL | Status: DC | PRN

## 2015-03-06 NOTE — Discharge Instructions (Signed)

## 2015-03-06 NOTE — ED Provider Notes (Signed)
CSN: [id number]     Arrival date & time 03/06/15  62 History   First MD Initiated Contact with Patient 03/06/15 1600     Chief Complaint  Patient presents with  . Chest Pain  . Abdominal Pain     (Consider location/radiation/quality/duration/timing/severity/associated sxs/prior Treatment) HPI this is a 54 year old male is a past medical history of cholelithiasis, coronary disease hypertension CHF with EF of 35-40% patient has a history of previous aortic dissection status post repair, hyperlipidemia, carotid stenosis.   He comes in today with complaint of right upper quadrant abdominal pain that started 1 hour prior to arrival lasted for 30 minutes, the pain gradually worsened and then resolved. He is currently in no pain. Denies any chest pain or shortness of breath says the pain radiated up into his over his right scapula   He says that his anginal equivalent is pain in the dead center of his back. Currently having no pain in the middle of his back. The pain when he had it was associated with nausea but no vomiting and diaphoresis. He had no other associated symptoms. He does have a history of cholelithiasis.  Past Medical History  Diagnosis Date  . CAD (coronary artery disease)     a. s/p CABG 2006;  b. DES to PDA 2011 (cath: Dx not seen, dRCA/PDA tx with DES; S-PDA occluded (culprit), S-Dx occluded, S-RI and OM ok, L-LAD ok  . HTN (hypertension)     severe  . Chronic systolic heart failure     a. 12/13 ECHO: EF 35-40%, sept, apical & posterobasal HK, LV mod dil & sys fx mod reduced, mild AI, MV mild reg, TV mild reg  . 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 CRI (chronic renal insufficiency)   . Thyroid cancer     H[employer] Cell  . HLD (hyperlipidemia)   . Anemia   . Gout   . AICD (automatic cardioverter/defibrillator) present   . Carotid stenosis     dopplers 2011: 0-39% bilat.  . Chest pain syndrome    Past Surgical History  Procedure Laterality  Date  . Coronary artery bypass graft  06/21/2009    x2  . Cardiac defibrillator placement      [employer]  . Thyroidectomy, partial  06/20/11  . Lumbar disc surgery      x 2, most recent within 5-10 years  . Status post emergency repair of a type a ascending aortic dissection with a hemiarch reconstruction of the ascending aorta  using a 28-mm hemashield graft with redo sternotomy and revision of previous bypass grafts in june 2010.    . Median sternotomy,cabg x 7  06/29/2005  . Implantable cardioverter defibrillator (icd) generator change N/A 12/13/2012    Procedure: ICD GENERATOR CHANGE;  Surgeon: Evans Lance, MD;  Location: [facility] CATH LAB;  Service: Cardiovascular;  Laterality: N/A;   Family History  Problem Relation Age of Onset  . Coronary artery disease    . Hypertension Father   . Heart disease Father   . Early death Father   . COPD Father   . Hypertension Mother    History  Substance Use Topics  . Smoking status: Never Smoker   . Smokeless tobacco: Never Used  . Alcohol Use: No    Review of Systems  Constitutional: Negative for fever and chills.  Eyes: Negative for redness.  Respiratory: Negative for cough and shortness of breath.   Cardiovascular: Negative for chest pain.  Gastrointestinal: Positive for nausea and abdominal pain. Negative for vomiting and diarrhea.  Genitourinary: Negative for dysuria.  Skin: Negative for rash.  Neurological: Negative for headaches.  All other systems reviewed and are negative.     Allergies  Iohexol; Lipitor; Sulfonamide derivatives; Latex; and Zocor  Home Medications   Prior to Admission medications   Medication Sig Start Date End Date Taking? Authorizing Provider  amLODipine (NORVASC) 5 MG tablet Take 1 tablet (5 mg total) by mouth daily. 01/28/15   Amy D Ninfa Meeker, NP  aspirin EC 81 MG EC tablet Take 1 tablet (81 mg total) by mouth daily. 01/28/15   Amy D Ninfa Meeker, NP  carvedilol (COREG) 25 MG tablet Take 1 tablet (25 mg  total) by mouth 2 (two) times daily with a meal. 01/28/15   Amy D Clegg, NP  clopidogrel (PLAVIX) 75 MG tablet Take 1 tablet (75 mg total) by mouth daily. 01/28/15   Amy D Ninfa Meeker, NP  cyclobenzaprine (FLEXERIL) 10 MG tablet Take 10 mg by mouth daily as needed for muscle spasms.     Historical Provider, MD  ezetimibe (ZETIA) 10 MG tablet Take 1 tablet (10 mg total) by mouth daily. 01/28/15   Amy D Ninfa Meeker, NP  furosemide (LASIX) 40 MG tablet Take 1 tablet (40 mg total) by mouth daily as needed for fluid. 06/25/14   Jolaine Artist, MD  hydrALAZINE (APRESOLINE) 50 MG tablet Take 1 tablet (50 mg total) by mouth 3 (three) times daily. 01/28/15   Amy D Ninfa Meeker, NP  isosorbide mononitrate (IMDUR) 60 MG 24 hr tablet Take 1 tablet (60 mg total) by mouth 2 (two) times daily. 01/28/15   Amy D Ninfa Meeker, NP  loratadine (CLARITIN) 10 MG tablet Take 1 tablet (10 mg total) by mouth daily as needed for allergies. 07/29/13   Amy D Ninfa Meeker, NP  nitroGLYCERIN (NITROSTAT) 0.4 MG SL tablet Place 1 tablet (0.4 mg total) under the tongue every 5 (five) minutes x 3 doses as needed for chest pain. 01/28/15   Amy D Ninfa Meeker, NP  OVER THE COUNTER MEDICATION Take 1 capsule by mouth daily. "Mega Men 50 Plus"    Historical Provider, MD  oxycodone (ROXICODONE) 30 MG immediate release tablet Take 30 mg by mouth every 8 (eight) hours as needed for pain.     Historical Provider, MD  potassium chloride SA (K-DUR,KLOR-CON) 20 MEQ tablet Take 1 tablet (20 mEq total) by mouth as needed (takes with lasix). 01/28/15   Amy D Clegg, NP   BP 123/80 mmHg  Pulse 71  Temp(Src) 98 F (36.7 C) (Oral)  Resp 16  Ht 5\' 11"  (1.803 m)  SpO2 97% Physical Exam  Constitutional: He is oriented to person, place, and time. No distress.  HENT:  Head: Normocephalic and atraumatic.  Eyes: EOM are normal. Pupils are equal, round, and reactive to light.  Neck: Normal range of motion. Neck supple.  Cardiovascular: Normal rate.   Pulmonary/Chest: Effort normal. No respiratory  distress.  Abdominal: Soft. There is tenderness (RUQ). There is no rebound and no guarding.  Neurological: He is alert and oriented to person, place, and time.  Skin: No rash noted. He is not diaphoretic.    ED Course  Procedures (including critical care time) Labs Review Labs Reviewed  LIPASE, BLOOD  COMPREHENSIVE METABOLIC PANEL  CBC WITH DIFFERENTIAL/PLATELET  URINALYSIS, ROUTINE W REFLEX MICROSCOPIC  I-STAT TROPOININ, ED    Imaging Review No results found.   EKG Interpretation   Date/Time:  Saturday March 06 2015  15:57:28 EST Ventricular Rate:  70 PR Interval:  191 QRS Duration: 111 QT Interval:  395 QTC Calculation: 426 R Axis:   -27 Text Interpretation:  Sinus rhythm Probable left atrial enlargement LVH  with secondary repolarization abnormality Probable inferior infarct, age  indeterminate No significant change since last tracing Confirmed by  Christy Gentles  MD, DONALD ([id number]) on 03/06/2015 4:07:48 PM      MDM   Final diagnoses:  None    This is a 54 year old male past medical history of coronary disease previous aortic dissection hypertension hyperlipidemia, cholelithiasis comes in today with complaint of right upper quadrant abdominal pain that wraps around his right scapula that has resolved since starting.   Patient is afebrile he is hemodynamically stable.  His abdomen is soft throughout, with very mild tenderness in the right upper quadrant area.   lung sounds clear to auscultation bilaterally.  Given his extensive history of coronary disease and atypical presentations with MIs in the past, will get troponin, EKG. Will get labs including CBC and lipase and a RUQ u/s  Labs unremarkable.  ekg w/o evidence of ischemia.  First trop neg.  RUQ u/s w/ cholelithiasis w/o signs of cholecystitis.  Have done delta trop, second trop undectable.  Patient having no CP, no SOB.  Doubt ACS.  Second EKG also obtained which was unchanged from previous.  Doubt aortic dissection  given that the pain resolved, no chest pain.  At this time, feel safe for d/c w/ surgery follow up.  I have discussed the results, Dx and Tx plan with the patient. They expressed understanding and agree with the plan and were told to return to ED with any worsening of condition or concern.    Disposition: Discharge  Condition: Good  Discharge Medication List as of 03/06/2015  8:34 PM    START taking these medications   Details  HYDROcodone-acetaminophen (NORCO/VICODIN) 5-325 MG per tablet Take 1 tablet by mouth every 4 (four) hours as needed., Starting 03/06/2015, Until Discontinued, Print        Follow Up: [facility] [place] [address] [phone]   please follow up as soon as possible to be evaluated for gallbladder surgery   Pt seen in conjunction with Dr. Prescott Gum, MD 03/08/15 1443  Ripley Fraise, MD 03/08/15 2239

## 2015-03-06 NOTE — ED Notes (Signed)
Pt from home via [facility] with c/o sudden onset RUQ pain radiating to right shoulder after working outside x 30 mins.  Pt reports 3 months ago he was told his galbladder needed to be removed.  Hx of multiple MI, open heart x 2, 1 stent.  Pt reports pain felt like previous MIs.  Pt took 3 nitro prior to EMS arrival with no relief in pain.  Given 324 mg aspirin.  When EMS started an IV pt's pain decreased to 0/10.  Pt in NAD, A&O.

## 2015-03-17 ENCOUNTER — Ambulatory Visit: Payer: Medicaid Other | Admitting: Family Medicine

## 2015-03-20 ENCOUNTER — Encounter (HOSPITAL_COMMUNITY): Payer: Self-pay | Admitting: Emergency Medicine

## 2015-03-20 ENCOUNTER — Emergency Department (HOSPITAL_COMMUNITY)
Admission: EM | Admit: 2015-03-20 | Discharge: 2015-03-21 | Disposition: A | Discharge status: Home / Self Care | Payer: Medicaid Other | Attending: Emergency Medicine | Admitting: Emergency Medicine

## 2015-03-20 DIAGNOSIS — I2510 Atherosclerotic heart disease of native coronary artery without angina pectoris: Secondary | ICD-10-CM | POA: Insufficient documentation

## 2015-03-20 DIAGNOSIS — Z95810 Presence of automatic (implantable) cardiac defibrillator: Secondary | ICD-10-CM | POA: Diagnosis not present

## 2015-03-20 DIAGNOSIS — Z8639 Personal history of other endocrine, nutritional and metabolic disease: Secondary | ICD-10-CM | POA: Insufficient documentation

## 2015-03-20 DIAGNOSIS — Z7901 Long term (current) use of anticoagulants: Secondary | ICD-10-CM | POA: Insufficient documentation

## 2015-03-20 DIAGNOSIS — Z951 Presence of aortocoronary bypass graft: Secondary | ICD-10-CM | POA: Insufficient documentation

## 2015-03-20 DIAGNOSIS — Z79899 Other long term (current) drug therapy: Secondary | ICD-10-CM | POA: Diagnosis not present

## 2015-03-20 DIAGNOSIS — Z862 Personal history of diseases of the blood and blood-forming organs and certain disorders involving the immune mechanism: Secondary | ICD-10-CM | POA: Diagnosis not present

## 2015-03-20 DIAGNOSIS — I129 Hypertensive chronic kidney disease with stage 1 through stage 4 chronic kidney disease, or unspecified chronic kidney disease: Secondary | ICD-10-CM | POA: Diagnosis not present

## 2015-03-20 DIAGNOSIS — K4021 Bilateral inguinal hernia, without obstruction or gangrene, recurrent: Secondary | ICD-10-CM | POA: Diagnosis not present

## 2015-03-20 DIAGNOSIS — Z91040 Latex allergy status: Secondary | ICD-10-CM | POA: Insufficient documentation

## 2015-03-20 DIAGNOSIS — Z8739 Personal history of other diseases of the musculoskeletal system and connective tissue: Secondary | ICD-10-CM | POA: Diagnosis not present

## 2015-03-20 DIAGNOSIS — N189 Chronic kidney disease, unspecified: Secondary | ICD-10-CM | POA: Diagnosis not present

## 2015-03-20 DIAGNOSIS — R109 Unspecified abdominal pain: Secondary | ICD-10-CM | POA: Diagnosis present

## 2015-03-20 DIAGNOSIS — Z85850 Personal history of malignant neoplasm of thyroid: Secondary | ICD-10-CM | POA: Diagnosis not present

## 2015-03-20 DIAGNOSIS — I5022 Chronic systolic (congestive) heart failure: Secondary | ICD-10-CM | POA: Insufficient documentation

## 2015-03-20 MED ORDER — SODIUM CHLORIDE 0.9 % IV SOLN
INTRAVENOUS | Status: DC
  Administered 2015-03-21: via INTRAVENOUS

## 2015-03-20 MED ORDER — BARIUM SULFATE 2.1 % PO SUSP
900.0000 mL | ORAL | Status: AC
  Administered 2015-03-21: 900 mL via ORAL

## 2015-03-20 NOTE — ED Provider Notes (Signed)
CSN: [id number]     Arrival date & time 03/20/15  2048 History   First MD Initiated Contact with Patient 03/20/15 2259     Chief Complaint  Patient presents with  . Abdominal Pain    Patient is a 54 y.o. male presenting with abdominal pain. The history is provided by the patient. No language interpreter was used.  Abdominal Pain Associated symptoms: no chills and no fever    This chart was scribed for Rolland Porter, MD by Thea Alken, ED Scribe. This patient was seen in room A07C/A07C and the patient's care was started at 2:43 AM.  HPI Comments:  Chad [name] is a 54 y.o. male who present to the Emergency Department complaining of lumps to groin noticed a couple of months ago. He reports lumps intermittently grow and shrink. He states his bulging on the right side has been coming and going for the past few months. It is not painful. He states he noted some swelling in the left side just a few weeks ago that is gotten progressively larger over the past week. He states nothing makes him hurt more or get bigger, nothing makes him feel better or go away.. 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 Pt was seen here about 10 days ago for abdominal pain and was diagnosed with trouble with gallbladder and reports some improvement with complaint but not much. He has an appointment to follow-up with the surgeon. Pt denies drainage of lumps , fever, difficulty urinating. Pt denies being a smoker and drinker.   PCP [facility] Dr Sung Amabile  Past Medical History  Diagnosis Date  . CAD (coronary artery disease)     a. s/p CABG 2006;  b. DES to PDA 2011 (cath: Dx not seen, dRCA/PDA tx with DES; S-PDA occluded (culprit), S-Dx occluded, S-RI and OM ok, L-LAD ok  . HTN (hypertension)     severe  . Chronic systolic heart failure     a. 12/13 ECHO: EF 35-40%, sept, apical & posterobasal HK, LV mod dil & sys fx mod reduced, mild AI,  MV mild reg, TV mild reg  . 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 CRI (chronic renal insufficiency)   . Thyroid cancer     [employer]  . HLD (hyperlipidemia)   . Anemia   . Gout   . AICD (automatic cardioverter/defibrillator) present   . Carotid stenosis     dopplers 2011: 0-39% bilat.  . Chest pain syndrome    Past Surgical History  Procedure Laterality Date  . Coronary artery bypass graft  06/21/2009    x2  . Cardiac defibrillator placement      [employer]  . Thyroidectomy, partial  06/20/11  . Lumbar disc surgery      x 2, most recent within 5-10 years  . Status post emergency repair of a type a ascending aortic dissection with a hemiarch reconstruction of the ascending aorta  using a 28-mm hemashield graft with redo sternotomy and revision of previous bypass grafts in june 2010.    . Median sternotomy,cabg x 7  06/29/2005  . Implantable cardioverter defibrillator (icd) generator change N/A 12/13/2012    Procedure: ICD GENERATOR CHANGE;  Surgeon: Evans Lance, MD;  Location: [facility] CATH LAB;  Service: Cardiovascular;  Laterality: N/A;   Family History  Problem Relation Age of Onset  . Coronary artery disease    .  Hypertension Father   . Heart disease Father   . Early death Father   . COPD Father   . Hypertension Mother    History  Substance Use Topics  . Smoking status: Never Smoker   . Smokeless tobacco: Never Used  . Alcohol Use: No  employed  Review of Systems  Constitutional: Negative for fever and chills.  Gastrointestinal: Positive for abdominal pain.  Genitourinary: Negative for difficulty urinating.  All other systems reviewed and are negative.     Allergies  Iohexol; Lipitor; Shellfish allergy; Sulfonamide derivatives; Latex; and Zocor  Home Medications   Prior to Admission medications   Medication Sig Start Date End Date Taking? Authorizing Provider  amLODipine (NORVASC) 5 MG tablet Take 1 tablet (5 mg total)  by mouth daily. 01/28/15  Yes Amy D Clegg, NP  b complex vitamins tablet Take 2 tablets by mouth daily.    Yes Historical Provider, MD  carvedilol (COREG) 25 MG tablet Take 1 tablet (25 mg total) by mouth 2 (two) times daily with a meal. 01/28/15  Yes Amy D Clegg, NP  clopidogrel (PLAVIX) 75 MG tablet Take 1 tablet (75 mg total) by mouth daily. Patient taking differently: Take 75 mg by mouth at bedtime.  01/28/15  Yes Amy D Clegg, NP  cyclobenzaprine (FLEXERIL) 10 MG tablet Take 10 mg by mouth daily as needed for muscle spasms.    Yes Historical Provider, MD  diphenhydrAMINE (BENADRYL) 25 MG tablet Take 12.5 mg by mouth 2 (two) times daily as needed for itching or allergies.   Yes Historical Provider, MD  ezetimibe (ZETIA) 10 MG tablet Take 1 tablet (10 mg total) by mouth daily. 01/28/15  Yes Amy D Clegg, NP  furosemide (LASIX) 40 MG tablet Take 1 tablet (40 mg total) by mouth daily as needed for fluid. Patient taking differently: Take 20-40 mg by mouth at bedtime as needed for fluid.  06/25/14  Yes Jolaine Artist, MD  hydrALAZINE (APRESOLINE) 50 MG tablet Take 1 tablet (50 mg total) by mouth 3 (three) times daily. 01/28/15  Yes Amy D Clegg, NP  HYDROcodone-acetaminophen (NORCO/VICODIN) 5-325 MG per tablet Take 1 tablet by mouth every 4 (four) hours as needed. Patient taking differently: Take 1 tablet by mouth every 4 (four) hours as needed (breakthrough pain).  03/06/15  Yes Jarome Matin, MD  isosorbide mononitrate (IMDUR) 60 MG 24 hr tablet Take 1 tablet (60 mg total) by mouth 2 (two) times daily. 01/28/15  Yes Amy D Clegg, NP  loratadine (CLARITIN) 10 MG tablet Take 1 tablet (10 mg total) by mouth daily as needed for allergies. 07/29/13  Yes Amy D Clegg, NP  Multiple Vitamin (MULTIVITAMIN WITH MINERALS) TABS tablet Take 1 tablet by mouth daily. Mega Men 50 Plus   Yes Historical Provider, MD  nitroGLYCERIN (NITROSTAT) 0.4 MG SL tablet Place 1 tablet (0.4 mg total) under the tongue every 5 (five) minutes x 3  doses as needed for chest pain. 01/28/15  Yes Amy D Clegg, NP  oxycodone (ROXICODONE) 30 MG immediate release tablet Take 30 mg by mouth every 8 (eight) hours. scheduled   Yes Historical Provider, MD  potassium chloride SA (K-DUR,KLOR-CON) 20 MEQ tablet Take 1 tablet (20 mEq total) by mouth as needed (takes with lasix). Patient taking differently: Take 20 mEq by mouth See admin instructions. Take 1-2 tablets (20 meq - 40 meq) for approx 3 days after a dose of lasix 01/28/15  Yes Amy D Clegg, NP  aspirin EC 81 MG EC  tablet Take 1 tablet (81 mg total) by mouth daily. Patient not taking: Reported on 03/20/2015 01/28/15   Amy D Clegg, NP   BP 128/78 mmHg  Pulse 64  Temp(Src) 98.6 F (37 C) (Oral)  Resp 12  SpO2 100%  Vital signs normal   Physical Exam  Constitutional: He is oriented to person, place, and time. He appears well-developed and well-nourished.  Non-toxic appearance. He does not appear ill. No distress.  HENT:  Head: Normocephalic and atraumatic.  Right Ear: External ear normal.  Left Ear: External ear normal.  Nose: Nose normal. No mucosal edema or rhinorrhea.  Mouth/Throat: Oropharynx is clear and moist and mucous membranes are normal. No dental abscesses or uvula swelling.  Eyes: Conjunctivae and EOM are normal. Pupils are equal, round, and reactive to light.  Neck: Normal range of motion and full passive range of motion without pain. Neck supple.  Cardiovascular: Normal rate, regular rhythm and normal heart sounds.  Exam reveals no gallop and no friction rub.   No murmur heard. Pulmonary/Chest: Effort normal and breath sounds normal. No respiratory distress. He has no wheezes. He has no rhonchi. He has no rales. He exhibits no tenderness and no crepitus.  Abdominal: Soft. Normal appearance and bowel sounds are normal. He exhibits no distension. There is no tenderness. There is no rebound and no guarding.  Genitourinary:  Soft swelling in right inguinal area that is non reduceable  with a 3 cm soft mass. Left side with small mass left inguinal area about 5 cm in size and  feels fixed.   Musculoskeletal: Normal range of motion. He exhibits no edema or tenderness.  Moves all extremities well.   Neurological: He is alert and oriented to person, place, and time. He has normal strength. No cranial nerve deficit.  Skin: Skin is warm, dry and intact. No rash noted. No erythema. No pallor.  Psychiatric: He has a normal mood and affect. His speech is normal and behavior is normal. His mood appears not anxious.  Nursing note and vitals reviewed.   ED Course  Procedures (including critical care time)  Medications  0.9 %  sodium chloride infusion ( Intravenous New Bag/Given 03/21/15 0009)  Barium Sulfate 2.1 % SUSP 900 mL (900 mLs Oral Given 03/21/15 0006)    DIAGNOSTIC STUDIES:    COORDINATION OF CARE: 2:43 AM- Pt advised of plan for treatment which includes a AP CT scan and pt agrees.  Pt was given the results of his CT scan. He states he has an a appointment at [facility] next week to discuss his gallstones, they can evaluate his hernias at that time.   Patient received pain medication on March 12 for his gallstones.  Labs Review Results for orders placed or performed during the hospital encounter of 03/20/15  Comprehensive metabolic panel  Result Value Ref Range   Sodium 139 135 - 145 mmol/L   Potassium 3.8 3.5 - 5.1 mmol/L   Chloride 103 96 - 112 mmol/L   CO2 30 19 - 32 mmol/L   Glucose, Bld 102 (H) 70 - 99 mg/dL   BUN 20 6 - 23 mg/dL   Creatinine, Ser 1.56 (H) 0.50 - 1.35 mg/dL   Calcium 8.7 8.4 - 10.5 mg/dL   Total Protein 6.4 6.0 - 8.3 g/dL   Albumin 3.7 3.5 - 5.2 g/dL   AST 21 0 - 37 U/L   ALT 21 0 - 53 U/L   Alkaline Phosphatase 69 39 - 117 U/L  Total Bilirubin 0.5 0.3 - 1.2 mg/dL   GFR calc non Af Amer 49 (L) >90 mL/min   GFR calc Af Amer 57 (L) >90 mL/min   Anion gap 6 5 - 15  CBC with Differential  Result Value Ref Range   WBC 3.5  (L) 4.0 - 10.5 K/uL   RBC 4.40 4.22 - 5.81 MIL/uL   Hemoglobin 11.8 (L) 13.0 - 17.0 g/dL   HCT 36.5 (L) 39.0 - 52.0 %   MCV 83.0 78.0 - 100.0 fL   MCH 26.8 26.0 - 34.0 pg   MCHC 32.3 30.0 - 36.0 g/dL   RDW 14.1 11.5 - 15.5 %   Platelets 123 (L) 150 - 400 K/uL   Neutrophils Relative % 38 (L) 43 - 77 %   Neutro Abs 1.3 (L) 1.7 - 7.7 K/uL   Lymphocytes Relative 42 12 - 46 %   Lymphs Abs 1.5 0.7 - 4.0 K/uL   Monocytes Relative 8 3 - 12 %   Monocytes Absolute 0.3 0.1 - 1.0 K/uL   Eosinophils Relative 11 (H) 0 - 5 %   Eosinophils Absolute 0.4 0.0 - 0.7 K/uL   Basophils Relative 0 0 - 1 %   Basophils Absolute 0.0 0.0 - 0.1 K/uL  Urinalysis, Routine w reflex microscopic  Result Value Ref Range   Color, Urine YELLOW YELLOW   APPearance CLEAR CLEAR   Specific Gravity, Urine 1.020 1.005 - 1.030   pH 5.5 5.0 - 8.0   Glucose, UA NEGATIVE NEGATIVE mg/dL   Hgb urine dipstick NEGATIVE NEGATIVE   Bilirubin Urine NEGATIVE NEGATIVE   Ketones, ur NEGATIVE NEGATIVE mg/dL   Protein, ur NEGATIVE NEGATIVE mg/dL   Urobilinogen, UA 0.2 0.0 - 1.0 mg/dL   Nitrite NEGATIVE NEGATIVE   Leukocytes, UA NEGATIVE NEGATIVE     Laboratory interpretation all normal except mild anemia, renal insufficiency     Imaging Review Ct Abdomen Pelvis Wo Contrast  03/21/2015   CLINICAL DATA:  Bilateral inguinal swelling. Left larger than right.  EXAM: CT ABDOMEN AND PELVIS WITHOUT CONTRAST  TECHNIQUE: Multidetector CT imaging of the abdomen and pelvis was performed following the standard protocol without IV contrast.  COMPARISON:  10/11/2014  FINDINGS: There is cardiomegaly. Pacer wires are noted in the right heart. Areas of scarring in the lung bases. No effusions.  Left kidney is severely atrophic, stable since prior CT. Small layering gallstones within the gallbladder. Liver, right kidney, adrenals, pancreas, spleen are unremarkable on this unenhanced study.  Large stool burden throughout the colon. Sigmoid  diverticulosis. No active diverticulitis. Stomach and small bowel are decompressed, unremarkable.  There are bilateral inguinal hernias noted, both containing fluid. No bowel within the hernias.  Urinary bladder is unremarkable.  Prominent abdominal aorta, measuring 4.0 cm at the aortic hiatus, stable since prior study. The previously seen dissection cannot be visualized on this unenhanced study. However, calcifications noted within the wall and intimal flap in the infrarenal abdominal aorta are noted and are stable since prior study.  No free fluid, free air or adenopathy. No acute bony abnormality or focal bone lesion. Degenerative changes within the lumbar spine, most pronounced at L4-5 and L5-S1.  IMPRESSION: Bilateral inguinal hernias, small on the right and moderate on the left containing fluid. No bowel within the hernias.  Large stool burden in the colon.  Sigmoid diverticulosis.  Cholelithiasis.  Stable aortic diameter, 4 cm at the aortic hiatus. Unable to visualize the dissection seen on prior study on today's study which was  performed without intravenous contrast.  Stable severely atrophic left kidney.   Electronically Signed   By: Rolm Baptise M.D.   On: 03/21/2015 02:20   US Abdomen Limited Ruq  03/06/2015   CLINICAL DATA:  Abdominal pain, hypertension, coronary artery disease post CABG, chronic systolic heart failure  EXAM: US ABDOMEN LIMITED - RIGHT UPPER QUADRANT  COMPARISON:  CT abdomen pelvis 10/11/2014  FINDINGS: Gallbladder:  The dependent echogenic material is seen within the gallbladder, some which does not shadow images sternal which does cause mild shadowing. This likely represents a combination of shaft and non shadowing calculi up to 6 mm diameter. Additional gallbladder sludge. No gallbladder wall thickening, pericholecystic fluid or sonographic Murphy sign.  Common bile duct:  Diameter: 7 mm diameter, may be normal for age.  Liver:  Normal appearance  No RIGHT upper quadrant free  fluid.  Technologist did note generalized tenderness in the RIGHT upper quadrant in the region of the gallbladder with imaging.  IMPRESSION: Cholelithiasis and sludge within gallbladder without definite evidence o facute cholecystitis.   Electronically Signed   By: Lavonia Dana M.D.   On: 03/06/2015 18:14    EKG Interpretation None      MDM   Final diagnoses:  Bilateral recurrent inguinal hernia without obstruction or gangrene   Plan discharge  Rolland Porter, MD, FACEP   I personally performed the services described in this documentation, which was scribed in my presence. The recorded information has been reviewed and considered.  Rolland Porter, MD, Barbette Or, MD 03/21/15 [id number]

## 2015-03-20 NOTE — ED Notes (Signed)
Pt reports he noticed a lump to left and right pelvic area above penis a month ago- reports the sites have become painful and tender to touch.  No open wounds present.

## 2015-03-21 ENCOUNTER — Emergency Department (HOSPITAL_COMMUNITY): Payer: Medicaid Other

## 2015-03-21 LAB — CBC WITH DIFFERENTIAL/PLATELET
BASOS ABS: 0 10*3/uL (ref 0.0–0.1)
Basophils Relative: 0 % (ref 0–1)
EOS ABS: 0.4 10*3/uL (ref 0.0–0.7)
EOS PCT: 11 % — AB (ref 0–5)
HCT: 36.5 % — ABNORMAL LOW (ref 39.0–52.0)
Hemoglobin: 11.8 g/dL — ABNORMAL LOW (ref 13.0–17.0)
Lymphocytes Relative: 42 % (ref 12–46)
Lymphs Abs: 1.5 10*3/uL (ref 0.7–4.0)
MCH: 26.8 pg (ref 26.0–34.0)
MCHC: 32.3 g/dL (ref 30.0–36.0)
MCV: 83 fL (ref 78.0–100.0)
MONO ABS: 0.3 10*3/uL (ref 0.1–1.0)
MONOS PCT: 8 % (ref 3–12)
Neutro Abs: 1.3 10*3/uL — ABNORMAL LOW (ref 1.7–7.7)
Neutrophils Relative %: 38 % — ABNORMAL LOW (ref 43–77)
Platelets: 123 10*3/uL — ABNORMAL LOW (ref 150–400)
RBC: 4.4 MIL/uL (ref 4.22–5.81)
RDW: 14.1 % (ref 11.5–15.5)
WBC: 3.5 10*3/uL — AB (ref 4.0–10.5)

## 2015-03-21 LAB — COMPREHENSIVE METABOLIC PANEL
ALK PHOS: 69 U/L (ref 39–117)
ALT: 21 U/L (ref 0–53)
ANION GAP: 6 (ref 5–15)
AST: 21 U/L (ref 0–37)
Albumin: 3.7 g/dL (ref 3.5–5.2)
BILIRUBIN TOTAL: 0.5 mg/dL (ref 0.3–1.2)
BUN: 20 mg/dL (ref 6–23)
CALCIUM: 8.7 mg/dL (ref 8.4–10.5)
CO2: 30 mmol/L (ref 19–32)
Chloride: 103 mmol/L (ref 96–112)
Creatinine, Ser: 1.56 mg/dL — ABNORMAL HIGH (ref 0.50–1.35)
GFR calc non Af Amer: 49 mL/min — ABNORMAL LOW (ref >90)
GFR, EST AFRICAN AMERICAN: 57 mL/min — AB (ref >90)
Glucose, Bld: 102 mg/dL — ABNORMAL HIGH (ref 70–99)
Potassium: 3.8 mmol/L (ref 3.5–5.1)
Sodium: 139 mmol/L (ref 135–145)
Total Protein: 6.4 g/dL (ref 6.0–8.3)

## 2015-03-21 LAB — URINALYSIS, ROUTINE W REFLEX MICROSCOPIC
Bilirubin Urine: NEGATIVE
Glucose, UA: NEGATIVE mg/dL
Hgb urine dipstick: NEGATIVE
Ketones, ur: NEGATIVE mg/dL
LEUKOCYTES UA: NEGATIVE
NITRITE: NEGATIVE
Protein, ur: NEGATIVE mg/dL
Specific Gravity, Urine: 1.02 (ref 1.005–1.030)
UROBILINOGEN UA: 0.2 mg/dL (ref 0.0–1.0)
pH: 5.5 (ref 5.0–8.0)

## 2015-03-21 NOTE — ED Notes (Signed)
CT notified patient done with barium

## 2015-03-21 NOTE — Discharge Instructions (Signed)
You have hernias on both sides. Right now however there is just fluid in the hernia and no intestines. You need to see a surgeon about your hernias. If you are seeing a surgeon about your gallstones, he or she can evaluate your hernias also. Return to the ED if you get worsening pain, vomiting, abdominal pain, fever or unable to have a BM.    Inguinal Hernia, Adult Muscles help keep everything in the body in its proper place. But if a weak spot in the muscles develops, something can poke through. That is called a hernia. When this happens in the lower part of the belly (abdomen), it is called an inguinal hernia. (It takes its name from a part of the body in this region called the inguinal canal.) A weak spot in the wall of muscles lets some fat or part of the small intestine bulge through. An inguinal hernia can develop at any age. Men get them more often than women. CAUSES  In adults, an inguinal hernia develops over time.  It can be triggered by:  Suddenly straining the muscles of the lower abdomen.  Lifting heavy objects.  Straining to have a bowel movement. Difficult bowel movements (constipation) can lead to this.  Constant coughing. This may be caused by smoking or lung disease.  Being overweight.  Being pregnant.  Working at a job that requires long periods of standing or heavy lifting.  Having had an inguinal hernia before. One type can be an emergency situation. It is called a strangulated inguinal hernia. It develops if part of the small intestine slips through the weak spot and cannot get back into the abdomen. The blood supply can be cut off. If that happens, part of the intestine may die. This situation requires emergency surgery. SYMPTOMS  Often, a small inguinal hernia has no symptoms. It is found when a healthcare provider does a physical exam. Larger hernias usually have symptoms.   In adults, symptoms may include:  A lump in the groin. This is easier to see when the  person is standing. It might disappear when lying down.  In men, a lump in the scrotum.  Pain or burning in the groin. This occurs especially when lifting, straining or coughing.  A dull ache or feeling of pressure in the groin.  Signs of a strangulated hernia can include:  A bulge in the groin that becomes very painful and tender to the touch.  A bulge that turns red or purple.  Fever, nausea and vomiting.  Inability to have a bowel movement or to pass gas. DIAGNOSIS  To decide if you have an inguinal hernia, a healthcare provider will probably do a physical examination.  This will include asking questions about any symptoms you have noticed.  The healthcare provider might feel the groin area and ask you to cough. If an inguinal hernia is felt, the healthcare provider may try to slide it back into the abdomen.  Usually no other tests are needed. TREATMENT  Treatments can vary. The size of the hernia makes a difference. Options include:  Watchful waiting. This is often suggested if the hernia is small and you have had no symptoms.  No medical procedure will be done unless symptoms develop.  You will need to watch closely for symptoms. If any occur, contact your healthcare provider right away.  Surgery. This is used if the hernia is larger or you have symptoms.  Open surgery. This is usually an outpatient procedure (you will not stay  overnight in a hospital). An cut (incision) is made through the skin in the groin. The hernia is put back inside the abdomen. The weak area in the muscles is then repaired by herniorrhaphy or hernioplasty. Herniorrhaphy: in this type of surgery, the weak muscles are sewn back together. Hernioplasty: a patch or mesh is used to close the weak area in the abdominal wall.  Laparoscopy. In this procedure, a surgeon makes small incisions. A thin tube with a tiny video camera (called a laparoscope) is put into the abdomen. The surgeon repairs the hernia  with mesh by looking with the video camera and using two long instruments. HOME CARE INSTRUCTIONS   After surgery to repair an inguinal hernia:  You will need to take pain medicine prescribed by your healthcare provider. Follow all directions carefully.  You will need to take care of the wound from the incision.  Your activity will be restricted for awhile. This will probably include no heavy lifting for several weeks. You also should not do anything too active for a few weeks. When you can return to work will depend on the type of job that you have.  During "watchful waiting" periods, you should:  Maintain a healthy weight.  Eat a diet high in fiber (fruits, vegetables and whole grains).  Drink plenty of fluids to avoid constipation. This means drinking enough water and other liquids to keep your urine clear or pale yellow.  Do not lift heavy objects.  Do not stand for long periods of time.  Quit smoking. This should keep you from developing a frequent cough. SEEK MEDICAL CARE IF:   A bulge develops in your groin area.  You feel pain, a burning sensation or pressure in the groin. This might be worse if you are lifting or straining.  You develop a fever of more than 100.5 F (38.1 C). SEEK IMMEDIATE MEDICAL CARE IF:   Pain in the groin increases suddenly.  A bulge in the groin gets bigger suddenly and does not go down.  For men, there is sudden pain in the scrotum. Or, the size of the scrotum increases.  A bulge in the groin area becomes red or purple and is painful to touch.  You have nausea or vomiting that does not go away.  You feel your heart beating much faster than normal.  You cannot have a bowel movement or pass gas.  You develop a fever of more than 102.0 F (38.9 C). Document Released: 04/29/2009 Document Revised: 03/04/2012 Document Reviewed: 04/29/2009 [facility] Patient Information 2015 [place], [place]. This information is not intended to replace advice  given to you by your health care provider. Make sure you discuss any questions you have with your health care provider.

## 2015-03-26 ENCOUNTER — Other Ambulatory Visit (HOSPITAL_COMMUNITY): Payer: Self-pay | Admitting: *Deleted

## 2015-03-26 MED ORDER — LORATADINE 10 MG PO TABS: 10.0000 mg | ORAL_TABLET | Freq: Every day | ORAL | Status: DC | PRN

## 2015-04-01 ENCOUNTER — Encounter (HOSPITAL_COMMUNITY): Payer: Self-pay | Admitting: *Deleted

## 2015-04-09 ENCOUNTER — Telehealth (HOSPITAL_COMMUNITY): Payer: Self-pay | Admitting: Vascular Surgery

## 2015-04-09 NOTE — Telephone Encounter (Signed)
Spoke w/pt, he had not been taking Amlodipine for a while and restarted it last week because his BP had been so high.  He states that since that time his BP has improved but is still elevated so he has increased it to 5 mg Twice daily and states his BP has been good since that time.  Pt is sch for f/u on Mon 4/18, he will keep that appt and we will discuss med adjustments at that time.

## 2015-04-09 NOTE — Telephone Encounter (Signed)
Pt called about his bp pressure, he feels like he needs to double up on his bp meds he wants to talk to a nurse.[name] He states he left a message on ur line also,, please advise

## 2015-04-12 ENCOUNTER — Ambulatory Visit (HOSPITAL_COMMUNITY)
Admission: RE | Admit: 2015-04-12 | Discharge: 2015-04-12 | Disposition: A | Discharge status: Home / Self Care | Payer: Medicaid Other | Source: Ambulatory Visit | Attending: Cardiology | Admitting: Cardiology

## 2015-04-12 ENCOUNTER — Telehealth (HOSPITAL_COMMUNITY): Payer: Self-pay | Admitting: *Deleted

## 2015-04-12 VITALS — BP 126/74 | HR 76 | Wt 165.0 lb

## 2015-04-12 DIAGNOSIS — I255 Ischemic cardiomyopathy: Secondary | ICD-10-CM | POA: Diagnosis not present

## 2015-04-12 DIAGNOSIS — I5023 Acute on chronic systolic (congestive) heart failure: Secondary | ICD-10-CM | POA: Insufficient documentation

## 2015-04-12 DIAGNOSIS — Z951 Presence of aortocoronary bypass graft: Secondary | ICD-10-CM | POA: Diagnosis not present

## 2015-04-12 DIAGNOSIS — I252 Old myocardial infarction: Secondary | ICD-10-CM | POA: Diagnosis not present

## 2015-04-12 DIAGNOSIS — Z7902 Long term (current) use of antithrombotics/antiplatelets: Secondary | ICD-10-CM | POA: Insufficient documentation

## 2015-04-12 DIAGNOSIS — M109 Gout, unspecified: Secondary | ICD-10-CM | POA: Insufficient documentation

## 2015-04-12 DIAGNOSIS — I5022 Chronic systolic (congestive) heart failure: Secondary | ICD-10-CM | POA: Diagnosis not present

## 2015-04-12 DIAGNOSIS — Z7982 Long term (current) use of aspirin: Secondary | ICD-10-CM | POA: Diagnosis not present

## 2015-04-12 DIAGNOSIS — Z79899 Other long term (current) drug therapy: Secondary | ICD-10-CM | POA: Insufficient documentation

## 2015-04-12 DIAGNOSIS — Z95810 Presence of automatic (implantable) cardiac defibrillator: Secondary | ICD-10-CM | POA: Diagnosis not present

## 2015-04-12 DIAGNOSIS — N189 Chronic kidney disease, unspecified: Secondary | ICD-10-CM | POA: Insufficient documentation

## 2015-04-12 DIAGNOSIS — Q600 Renal agenesis, unilateral: Secondary | ICD-10-CM | POA: Diagnosis not present

## 2015-04-12 DIAGNOSIS — E785 Hyperlipidemia, unspecified: Secondary | ICD-10-CM | POA: Insufficient documentation

## 2015-04-12 DIAGNOSIS — I7103 Dissection of thoracoabdominal aorta: Secondary | ICD-10-CM | POA: Diagnosis not present

## 2015-04-12 DIAGNOSIS — I2510 Atherosclerotic heart disease of native coronary artery without angina pectoris: Secondary | ICD-10-CM | POA: Diagnosis not present

## 2015-04-12 DIAGNOSIS — N183 Chronic kidney disease, stage 3 unspecified: Secondary | ICD-10-CM | POA: Insufficient documentation

## 2015-04-12 DIAGNOSIS — I129 Hypertensive chronic kidney disease with stage 1 through stage 4 chronic kidney disease, or unspecified chronic kidney disease: Secondary | ICD-10-CM | POA: Diagnosis not present

## 2015-04-12 MED ORDER — FUROSEMIDE 20 MG PO TABS: 20.0000 mg | ORAL_TABLET | ORAL | Status: DC

## 2015-04-12 MED ORDER — AMLODIPINE BESYLATE 5 MG PO TABS: 5.0000 mg | ORAL_TABLET | Freq: Two times a day (BID) | ORAL | Status: DC

## 2015-04-12 MED ORDER — SPIRONOLACTONE 25 MG PO TABS: 12.5000 mg | ORAL_TABLET | Freq: Every day | ORAL | Status: DC

## 2015-04-12 MED ORDER — CLOPIDOGREL BISULFATE 75 MG PO TABS: 75.0000 mg | ORAL_TABLET | Freq: Every day | ORAL | Status: DC

## 2015-04-12 MED ORDER — HYDRALAZINE HCL 50 MG PO TABS: 75.0000 mg | ORAL_TABLET | Freq: Three times a day (TID) | ORAL | Status: DC

## 2015-04-12 MED ORDER — EZETIMIBE 10 MG PO TABS: 10.0000 mg | ORAL_TABLET | Freq: Every day | ORAL | Status: DC

## 2015-04-12 MED ORDER — POTASSIUM CHLORIDE CRYS ER 10 MEQ PO TBCR: 10.0000 meq | EXTENDED_RELEASE_TABLET | ORAL | Status: DC

## 2015-04-12 MED ORDER — ASPIRIN EC 81 MG PO TBEC: 81.0000 mg | DELAYED_RELEASE_TABLET | Freq: Every day | ORAL | Status: DC

## 2015-04-12 NOTE — Patient Instructions (Addendum)
Start Aspirin 81 mg daily  Restart Zetia 10 mg daily, prescription sent in  Start Furosemide (Lasix) 20 mg every other day, prescription sent in  Start Potassium 10 meq every other day , prescription sent in  Continue Amlodipine 5 mg Twice daily, prescription sent in  Increase Hydralazine to 75 mg (1 and 1/2 tabs) Three times a day, prescription sent in   Start Spironolactone 12.5 mg (1/2 tab) daily, prescription sent in  Labs in 2 weeks (bmet, bnp)  Your physician recommends that you schedule a follow-up appointment in: 2-3 weeks with Dr Haroldine Laws

## 2015-04-12 NOTE — Progress Notes (Signed)
Patient ID: Chad [name], male   DOB: 1961-03-25, 54 y.o.   MRN: [id number]  History of Present Illness: Primary Cardiologist:  Dr. Demetrios Loll is a 54 y.o. male with history of severe HTN, coronary artery disease status post previous myocardial infarction and bypass surgery in 2006 with DES to native PDA in 2011.  He also has a history congestive heart failure secondary to ischemic cardiomyopathy   He is s/p single chamber [employer] ICD.  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 He underwent emergent repair of proximal aorta and reimplantation of his CABG grafts.  Carotids u/s in 2011 0-39%.  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 Also with significant low back pain s/p 2 surgeries.   ETT/Myoview in 9/12: Walked 8:46 on treadmill. Moderate-sized fixed inferior perfusion defect consistent with prior MI. EF 31% with septal hypokinesis. There was also inferior hypokinesis. Similar to prior study.   11/26/12 ECHO EF 35-40%   He was admitted in 2/16 with chest pain.  Echo with EF 25%, diffuse hypokinesis, 4.5 cm aortic root, RV mildly dilated with mildly decreased systolic function.  Cardiolite showed inferior infarction with mild peri-infarct ischemia, EF 27%.   Since discharge from 2/16 hospitalization, BP has been running high (SBP 160s at home).  He has been taking amlodipine 5 mg bid.  He takes Lasix about once every 3-4 weeks.  No further chest pain.  No orthopnea/PND.  He says that he is not short of breath unless BP is running very high.  When BP high, he is short of breath after walking about 50 yards.     Labs 11/25/13 Cholesterol 186 TG 121 HDL 49  Labs 12/09/13 K 3.6 creatinine 1.3  Labs 3/15 K 4.1, creatinine 1.4, BNP 589, HCT 39.8 Labs 3/16 K 3.8, creatinine 1.56, HCT 36.5  ROS: All pertinent positives and negatives as in HPI, otherwise negative.    Past Medical History  Diagnosis Date  .  CAD (coronary artery disease)     a. s/p CABG 2006;  b. DES to PDA 2011 (cath: Dx not seen, dRCA/PDA tx with DES; S-PDA occluded (culprit), S-Dx occluded, S-RI and OM ok, L-LAD ok  . HTN (hypertension)     severe  . Chronic systolic heart failure     a. 12/13 ECHO: EF 35-40%, sept, apical & posterobasal HK, LV mod dil & sys fx mod reduced, mild AI, MV mild reg, TV mild reg  . 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 CRI (chronic renal insufficiency)   . Thyroid cancer     Hertle Cell  . HLD (hyperlipidemia)   . Anemia   . Gout   . AICD (automatic cardioverter/defibrillator) present   . Carotid stenosis     dopplers 2011: 0-39% bilat.  . Chest pain syndrome     Current Outpatient Prescriptions  Medication Sig Dispense Refill  . amLODipine (NORVASC) 5 MG tablet Take 1 tablet (5 mg total) by mouth 2 (two) times daily. 60 tablet 6  . b complex vitamins tablet Take 2 tablets by mouth daily.     . carvedilol (COREG) 25 MG tablet Take 1 tablet (25 mg total) by mouth 2 (two) times daily with a meal. 60 tablet 6  . clopidogrel (PLAVIX) 75 MG tablet Take 1 tablet (75 mg total) by mouth at bedtime. 30 tablet 6  . cyclobenzaprine (FLEXERIL) 10 MG  tablet Take 10 mg by mouth daily as needed for muscle spasms.     . diphenhydrAMINE (BENADRYL) 25 MG tablet Take 12.5 mg by mouth 2 (two) times daily as needed for itching or allergies.    . furosemide (LASIX) 20 MG tablet Take 1 tablet (20 mg total) by mouth every other day. 15 tablet 3  . hydrALAZINE (APRESOLINE) 50 MG tablet Take 1.5 tablets (75 mg total) by mouth 3 (three) times daily. 135 tablet 6  . HYDROcodone-acetaminophen (NORCO/VICODIN) 5-325 MG per tablet Take 1 tablet by mouth every 4 (four) hours as needed. (Patient taking differently: Take 1 tablet by mouth every 4 (four) hours as needed (breakthrough pain). ) 10 tablet 0  . isosorbide mononitrate (IMDUR) 60 MG 24 hr tablet Take 1 tablet (60 mg total) by mouth 2  (two) times daily. 60 tablet 6  . loratadine (CLARITIN) 10 MG tablet Take 1 tablet (10 mg total) by mouth daily as needed for allergies. 30 tablet 11  . Multiple Vitamin (MULTIVITAMIN WITH MINERALS) TABS tablet Take 1 tablet by mouth daily. Mega Men 50 Plus    . nitroGLYCERIN (NITROSTAT) 0.4 MG SL tablet Place 1 tablet (0.4 mg total) under the tongue every 5 (five) minutes x 3 doses as needed for chest pain. 30 tablet 12  . oxycodone (ROXICODONE) 30 MG immediate release tablet Take 30 mg by mouth every 8 (eight) hours. scheduled    . aspirin EC 81 MG tablet Take 1 tablet (81 mg total) by mouth daily. 90 tablet 3  . ezetimibe (ZETIA) 10 MG tablet Take 1 tablet (10 mg total) by mouth daily. 30 tablet 6  . potassium chloride SA (K-DUR,KLOR-CON) 10 MEQ tablet Take 1 tablet (10 mEq total) by mouth every other day. 15 tablet 3  . spironolactone (ALDACTONE) 25 MG tablet Take 0.5 tablets (12.5 mg total) by mouth daily. 15 tablet 3   No current facility-administered medications for this encounter.    Allergies  Allergen Reactions  . Iohexol Anaphylaxis and Other (See Comments)     Desc: PT HAS ANAPHYLAXIS WITH CONTRAST MEDIA!  . Lipitor [Atorvastatin Calcium] Anaphylaxis and Other (See Comments)    Large doses  . Shellfish Allergy Anaphylaxis  . Sulfonamide Derivatives Shortness Of Breath  . Latex Rash    With long periods of exposure  . Zocor [Simvastatin] Other (See Comments)    Muscle cramps    Vital Signs: Filed Vitals:   04/12/15 1426  BP: 126/74  Pulse: 76  Weight: 165 lb (74.844 kg)  SpO2: 98%    Physical Exam: General:  Well appearing. No resp difficulty HEENT: normal Neck: supple. JVP 8 cm. Carotids 2+ bilat; carotid bruits L>R No lymphadenopathy or thryomegaly appreciated. Cor: PMI nondisplaced. Regular rate & rhythm. 2/6 SEM RUSB  Lungs: clear Abdomen: soft, nontender, nondistended. No hepatosplenomegaly. No bruits or masses. Good bowel sounds. Extremities: no cyanosis,  clubbing, rash, no edema Neuro: alert & orientedx3, cranial nerves grossly intact. moves all 4 extremities w/o difficulty. Affect pleasant  ASSESSMENT/Plan  1. Chronic systolic HF: Ischemic cardiomyopathy.  Echo (2/16) with EF 25%.  S/p [employer] ICD. NYHA class II symptoms, probably very mild volume overload.   - I will have him take Lasix 20 mg every other day.  BMET/BNP in 2 wks.  - On goal carvedilol 25 mg twice a day - Increase hydralazine to 75 mg tid, continue Imdur 60 bid.  - Start spironolactone 12.5 mg daily.  - Not on ACEI due to CKD  and solitary kidney. - Reinforced the need and importance of daily weights, a low sodium diet, and fluid restriction (less than 2 L a day). Instructed to call the HF clinic if weight increases more than 3 lbs overnight or 5 lbs in a week.  2. CAD: s/p CABG.  No further chest pain.  Cardiolite in 2/16 with inferior scar c/w prior MI, no significant ischemia. Continue Plavix and restart ASA 81 mg daily.  He cannot tolerate statins due myalgias; we have tried even low-dose and he has not tolerated. Off Zetia (ran out), needs to restart.  Lipids in 2 months.   3. CKD: Most recent creatinine was stable at 1.56.  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 4. HTN: BP running too high.  As above, increasing hydralazine and adding spironolactone.  He will write down home BPs and bring to followup appt.  5. H/o Type I aortic dissection: Follows with [facility].   Follow up in 2 wks.   Loralie Champagne 04/12/2015  Patient seen with NP, agree with the above note.  He is doing well and is overall stable.  He will followup in 6 months with echo, no medication changes today.   Loralie Champagne 04/12/2015

## 2015-04-12 NOTE — Telephone Encounter (Signed)
Received form from [facility] pt needs surgical clearance, per Dr Haroldine Laws pt ok to proceed, moderate risk for peri-op cardiac complications, note faxed back to [facility], LPN at [phone]

## 2015-04-13 ENCOUNTER — Other Ambulatory Visit: Payer: Self-pay | Admitting: Surgery

## 2015-04-14 ENCOUNTER — Telehealth (HOSPITAL_COMMUNITY): Payer: Self-pay | Admitting: Vascular Surgery

## 2015-04-14 NOTE — Telephone Encounter (Signed)
Chad [name] called she received pt order for tele monitoring.[name] She would like to speak to someone about the weight parameters.. Please advise

## 2015-04-14 NOTE — Telephone Encounter (Signed)
VO given to Anne to reset telemonitoring weight parameters to 160 lb - 171 lb, patient's baseline is 165 lbs.  Renee Pain

## 2015-04-27 ENCOUNTER — Encounter: Payer: Self-pay | Admitting: *Deleted

## 2015-04-27 ENCOUNTER — Telehealth (HOSPITAL_COMMUNITY): Payer: Self-pay | Admitting: *Deleted

## 2015-04-27 NOTE — Telephone Encounter (Signed)
Faxed approval for zetia 10mg .   Approved from 04/13/15- 04/12/16 PA #[id number]

## 2015-04-29 ENCOUNTER — Encounter (HOSPITAL_COMMUNITY): Payer: Medicaid Other

## 2015-05-02 ENCOUNTER — Encounter (HOSPITAL_COMMUNITY): Payer: Self-pay

## 2015-05-02 ENCOUNTER — Emergency Department (HOSPITAL_COMMUNITY)
Admission: EM | Admit: 2015-05-02 | Discharge: 2015-05-02 | Disposition: A | Discharge status: Home / Self Care | Payer: Medicaid Other | Source: Home / Self Care | Attending: Family Medicine | Admitting: Family Medicine

## 2015-05-02 DIAGNOSIS — G8929 Other chronic pain: Secondary | ICD-10-CM | POA: Diagnosis not present

## 2015-05-02 DIAGNOSIS — M549 Dorsalgia, unspecified: Secondary | ICD-10-CM | POA: Diagnosis not present

## 2015-05-02 MED ORDER — HYDROCODONE-ACETAMINOPHEN 5-325 MG PO TABS
2.0000 | ORAL_TABLET | Freq: Once | ORAL | Status: AC
  Administered 2015-05-02: 2 via ORAL

## 2015-05-02 MED ORDER — HYDROCODONE-ACETAMINOPHEN 5-325 MG PO TABS
ORAL_TABLET | ORAL | Status: AC
  Filled 2015-05-02: qty 2

## 2015-05-02 NOTE — Discharge Instructions (Signed)
Thank you for coming in today. Follow up with your pain doctor.  Come back or go to the emergency room if you notice new weakness new numbness problems walking or bowel or bladder problems.  Chronic Back Pain  When back pain lasts longer than 3 months, it is called chronic back pain.People with chronic back pain often go through certain periods that are more intense (flare-ups).  CAUSES Chronic back pain can be caused by wear and tear (degeneration) on different structures in your back. These structures include:  The bones of your spine (vertebrae) and the joints surrounding your spinal cord and nerve roots (facets).  The strong, fibrous tissues that connect your vertebrae (ligaments). Degeneration of these structures may result in pressure on your nerves. This can lead to constant pain. HOME CARE INSTRUCTIONS  Avoid bending, heavy lifting, prolonged sitting, and activities which make the problem worse.  Take brief periods of rest throughout the day to reduce your pain. Lying down or standing usually is better than sitting while you are resting.  Take over-the-counter or prescription medicines only as directed by your caregiver. SEEK IMMEDIATE MEDICAL CARE IF:   You have weakness or numbness in one of your legs or feet.  You have trouble controlling your bladder or bowels.  You have nausea, vomiting, abdominal pain, shortness of breath, or fainting. Document Released: 01/18/2005 Document Revised: 03/04/2012 Document Reviewed: 11/25/2011 [facility] Patient Information 2015 [place], [place]. This information is not intended to replace advice given to you by your health care provider. Make sure you discuss any questions you have with your health care provider.

## 2015-05-02 NOTE — ED Provider Notes (Signed)
Chad [name] is a 54 y.o. male who presents to Urgent Care today for chronic back pain. Mr Halt is well known to me as he was formally a patient of [facility]. He has chronic back pain which is currently managed with 3 times a day oxycodone supplied via the [facility]. He has been out of town for the last several days traveling to a wedding in [place] and is unable to refill his chronic pain medicine. He ran out yesterday. He notes his pain is severe. He is hoping to follow-up with his chronic pain physician in the near future. He denies any injury. Pain is felt in the back and radiates to the legs bilaterally.   Past Medical History  Diagnosis Date  . CAD (coronary artery disease)     a. s/p CABG 2006;  b. DES to PDA 2011 (cath: Dx not seen, dRCA/PDA tx with DES; S-PDA occluded (culprit), S-Dx occluded, S-RI and OM ok, L-LAD ok  . HTN (hypertension)     severe  . Chronic systolic heart failure     a. 12/13 ECHO: EF 35-40%, sept, apical & posterobasal HK, LV mod dil & sys fx mod reduced, mild AI, MV mild reg, TV mild reg  . 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 CRI (chronic renal insufficiency)   . Thyroid cancer     Hertle Cell  . HLD (hyperlipidemia)   . Anemia   . Gout   . AICD (automatic cardioverter/defibrillator) present   . Carotid stenosis     dopplers 2011: 0-39% bilat.  . Chest pain syndrome    Past Surgical History  Procedure Laterality Date  . Coronary artery bypass graft  06/21/2009    x2  . Cardiac defibrillator placement      [employer]  . Thyroidectomy, partial  06/20/11  . Lumbar disc surgery      x 2, most recent within 5-10 years  . Status post emergency repair of a type a ascending aortic dissection with a hemiarch reconstruction of the ascending aorta  using a 28-mm hemashield graft with redo sternotomy and revision of previous bypass grafts in june 2010.    . Median  sternotomy,cabg x 7  06/29/2005  . Implantable cardioverter defibrillator (icd) generator change N/A 12/13/2012    Procedure: ICD GENERATOR CHANGE;  Surgeon: Evans Lance, MD;  Location: [facility] C[facility] LAB;  Service: Cardiovascular;  Laterality: N/A;   History  Substance Use Topics  . Smoking status: Never Smoker   . Smokeless tobacco: Never Used  . Alcohol Use: No   ROS as above Medications: Current Facility-Administered Medications  Medication Dose Route Frequency Provider Last Rate Last Dose  . HYDROcodone-acetaminophen (NORCO/VICODIN) 5-325 MG per tablet 2 tablet  2 tablet Oral Once Gregor Hams, MD       Current Outpatient Prescriptions  Medication Sig Dispense Refill  . amLODipine (NORVASC) 5 MG tablet Take 1 tablet (5 mg total) by mouth 2 (two) times daily. 60 tablet 6  . aspirin EC 81 MG tablet Take 1 tablet (81 mg total) by mouth daily. 90 tablet 3  . b complex vitamins tablet Take 2 tablets by mouth daily.     . carvedilol (COREG) 25 MG tablet Take 1 tablet (25 mg total) by mouth 2 (two) times daily with a meal. 60 tablet 6  . clopidogrel (PLAVIX) 75 MG tablet Take 1 tablet (75 mg total) by mouth at  bedtime. 30 tablet 6  . cyclobenzaprine (FLEXERIL) 10 MG tablet Take 10 mg by mouth daily as needed for muscle spasms.     . diphenhydrAMINE (BENADRYL) 25 MG tablet Take 12.5 mg by mouth 2 (two) times daily as needed for itching or allergies.    [name] ezetimibe (ZETIA) 10 MG tablet Take 1 tablet (10 mg total) by mouth daily. 30 tablet 6  . furosemide (LASIX) 20 MG tablet Take 1 tablet (20 mg total) by mouth every other day. 15 tablet 3  . hydrALAZINE (APRESOLINE) 50 MG tablet Take 1.5 tablets (75 mg total) by mouth 3 (three) times daily. 135 tablet 6  . HYDROcodone-acetaminophen (NORCO/VICODIN) 5-325 MG per tablet Take 1 tablet by mouth every 4 (four) hours as needed. (Patient taking differently: Take 1 tablet by mouth every 4 (four) hours as needed (breakthrough pain). ) 10 tablet 0  .  isosorbide mononitrate (IMDUR) 60 MG 24 hr tablet Take 1 tablet (60 mg total) by mouth 2 (two) times daily. 60 tablet 6  . loratadine (CLARITIN) 10 MG tablet Take 1 tablet (10 mg total) by mouth daily as needed for allergies. 30 tablet 11  . Multiple Vitamin (MULTIVITAMIN WITH MINERALS) TABS tablet Take 1 tablet by mouth daily. Mega Men 50 Plus    . nitroGLYCERIN (NITROSTAT) 0.4 MG SL tablet Place 1 tablet (0.4 mg total) under the tongue every 5 (five) minutes x 3 doses as needed for chest pain. 30 tablet 12  . oxycodone (ROXICODONE) 30 MG immediate release tablet Take 30 mg by mouth every 8 (eight) hours. scheduled    . potassium chloride SA (K-DUR,KLOR-CON) 10 MEQ tablet Take 1 tablet (10 mEq total) by mouth every other day. 15 tablet 3  . spironolactone (ALDACTONE) 25 MG tablet Take 0.5 tablets (12.5 mg total) by mouth daily. 15 tablet 3   Allergies  Allergen Reactions  . Iohexol Anaphylaxis and Other (See Comments)     Desc: PT HAS ANAPHYLAXIS WITH CONTRAST MEDIA!  . Lipitor [Atorvastatin Calcium] Anaphylaxis and Other (See Comments)    Large doses  . Shellfish Allergy Anaphylaxis  . Sulfonamide Derivatives Shortness Of Breath  . Latex Rash    With long periods of exposure  . Zocor [Simvastatin] Other (See Comments)    Muscle cramps     Exam:  BP 151/84 mmHg  Pulse 75  Temp(Src) 97.9 F (36.6 C) (Oral)  Resp 18  SpO2 98% Gen: Well NAD HEENT: EOMI,  MMM Lungs: Normal work of breathing. CTABL Heart: RRR no MRG Abd: NABS, Soft. Nondistended, Nontender Exts: Brisk capillary refill, warm and well perfused.  Back: Tender to palpation bilateral lumbar paraspinals. Lower extremity reflexes are equal and normal throughout. Antalgic gait. Normal strength lower extremities.  Patient was given 2 tablets of Norco 5/325mg  PO prior to discharge.    No results found for this or any previous visit (from the past 24 hour(s)). No results found.  Assessment and Plan: 54 y.o. male with  chronic back pain. Follow-up with pain management. Return as needed.  Discussed warning signs or symptoms. Please see discharge instructions. Patient expresses understanding.     Gregor Hams, MD 05/02/15 [phone]

## 2015-05-02 NOTE — ED Notes (Signed)
States his back problem worse after traveling to [facility] for family function. Pain '15' of 1-10 . NAD, calm, conversant

## 2015-05-07 ENCOUNTER — Ambulatory Visit (HOSPITAL_COMMUNITY): Admission: RE | Admit: 2015-05-07 | Payer: Medicaid Other | Source: Ambulatory Visit | Admitting: Surgery

## 2015-05-07 ENCOUNTER — Encounter (HOSPITAL_COMMUNITY): Admission: RE | Payer: Self-pay | Source: Ambulatory Visit

## 2015-05-07 SURGERY — REPAIR, HERNIA, INGUINAL, BILATERAL, LAPAROSCOPIC
Anesthesia: General | Site: Groin | Laterality: Left

## 2015-05-28 ENCOUNTER — Encounter: Payer: Self-pay | Admitting: *Deleted

## 2015-06-14 IMAGING — CR DG CHEST 1V PORT
1 series · 1 of 1 positions shown · non-contrast
Comparison: Radiograph 10/10/2014, CT 10/11/2014

CLINICAL DATA: Chest pain. History of aortic dissection post
repair.

EXAM:
PORTABLE CHEST - 1 VIEW

[AP]
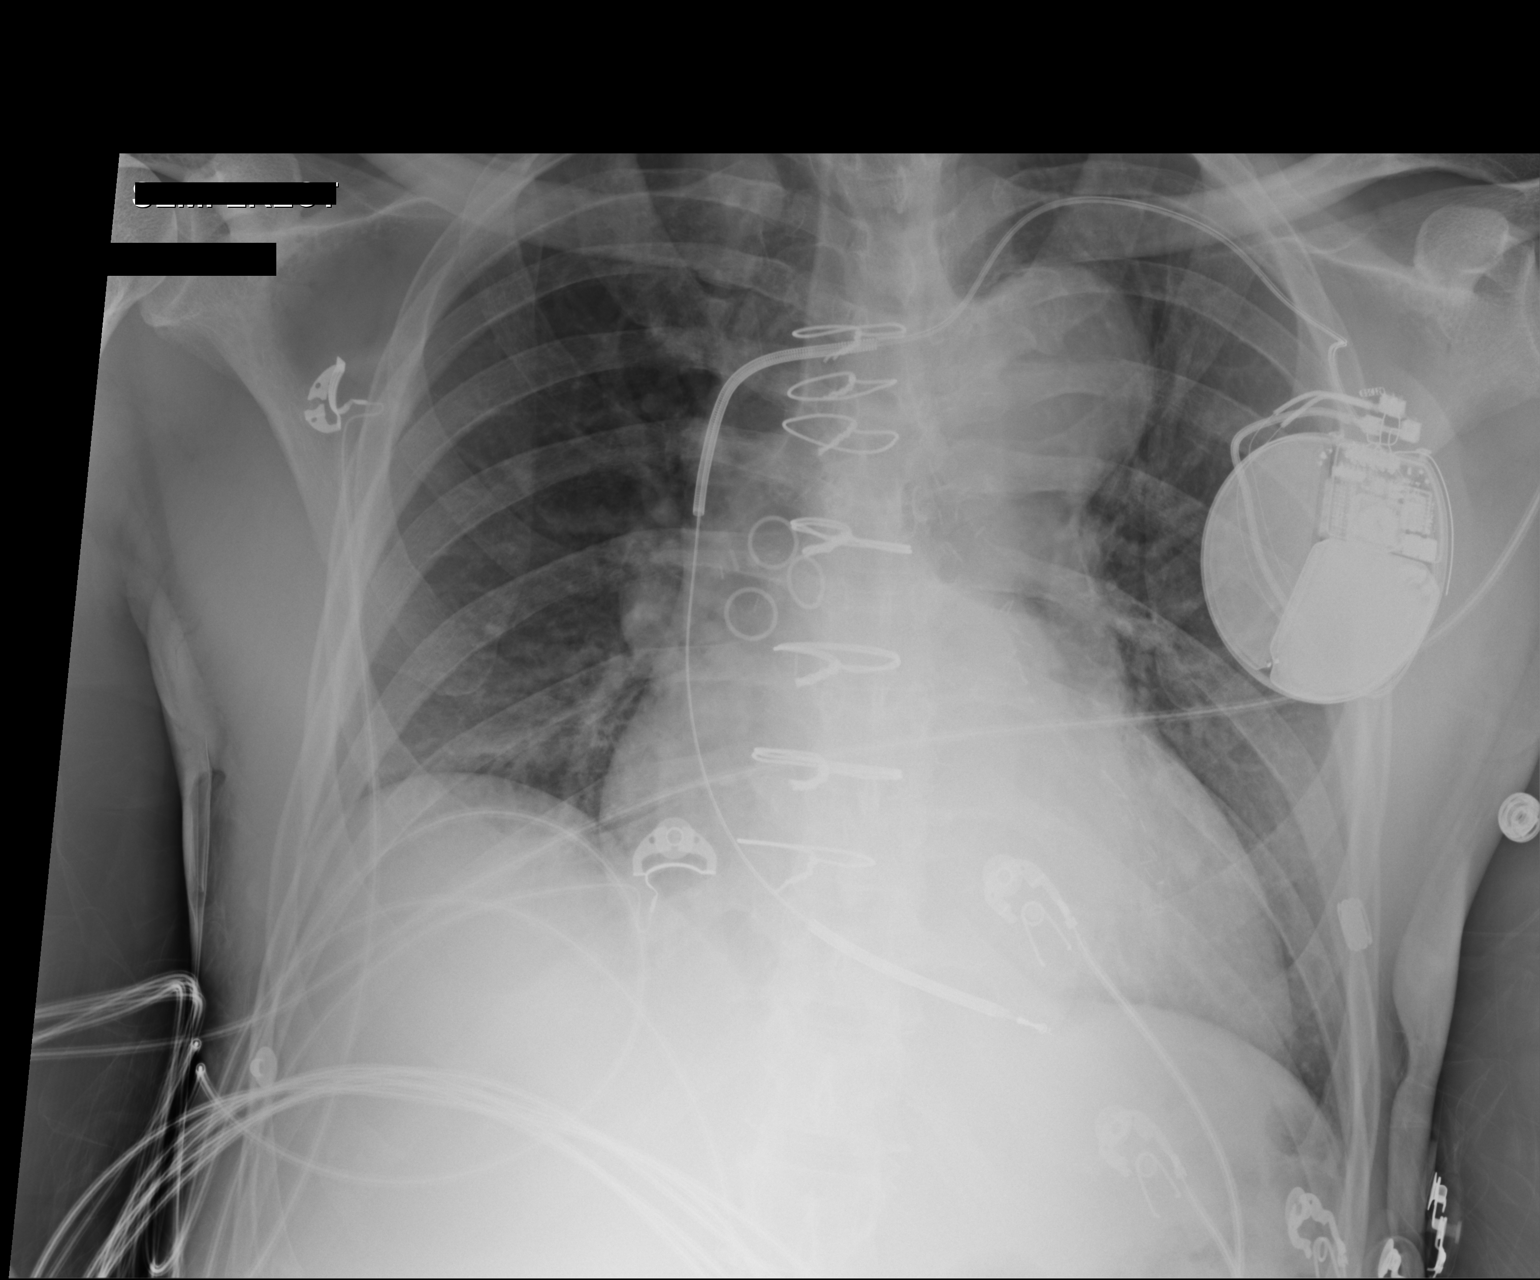

[1 of 1 positions shown; findings below may reference images not displayed]

FINDINGS: Patient is post median sternotomy. Left-sided pacemaker remains in
place. Cardiomegaly is not significantly changed allowing for
differences in technique. Aneurysmal dilatation of the aortic arch
appears similar to prior radiograph. Lower lung volumes from prior.
No consolidation. No pleural effusion or pneumothorax. Pulmonary
vasculature is normal. Osseous structures are intact.
IMPRESSION: No evidence of acute pulmonary process. Stable cardiomegaly and
mediastinal contours.

## 2015-06-22 ENCOUNTER — Encounter: Payer: Self-pay | Admitting: *Deleted

## 2015-06-24 ENCOUNTER — Telehealth (HOSPITAL_COMMUNITY): Payer: Self-pay | Admitting: Vascular Surgery

## 2015-06-24 NOTE — Telephone Encounter (Signed)
Pt came by he wants a appt w/ Linna Hoff in July.. Asasp.[name] Please call pt

## 2015-06-24 NOTE — Telephone Encounter (Signed)
Spoke w/pt earlier today and he said he was doing ok just needed f/u, will discuss w/him real need to be seen this month

## 2015-06-29 NOTE — Telephone Encounter (Signed)
Spoke w/pt he states he would like an appt with DB to discuss moving to [facility], appt sch for 8/1

## 2015-07-20 ENCOUNTER — Encounter: Payer: Self-pay | Admitting: *Deleted

## 2015-07-21 ENCOUNTER — Other Ambulatory Visit (HOSPITAL_COMMUNITY): Payer: Self-pay | Admitting: Adult Health

## 2015-07-24 IMAGING — CR DG CHEST 2V
2 series · 2 of 2 positions shown · non-contrast
Comparison: 01/25/2015

CLINICAL DATA: RIGHT chest pain, shortness of breath, diaphoresis,
RIGHT arm pain and nausea for 2 hr, history coronary artery disease
post MI and CABG, chronic systolic heart failure, hypertension,
thyroid cancer, prior thoracoabdominal aortic dissection

EXAM:
CHEST  2 VIEW

[chest pa]
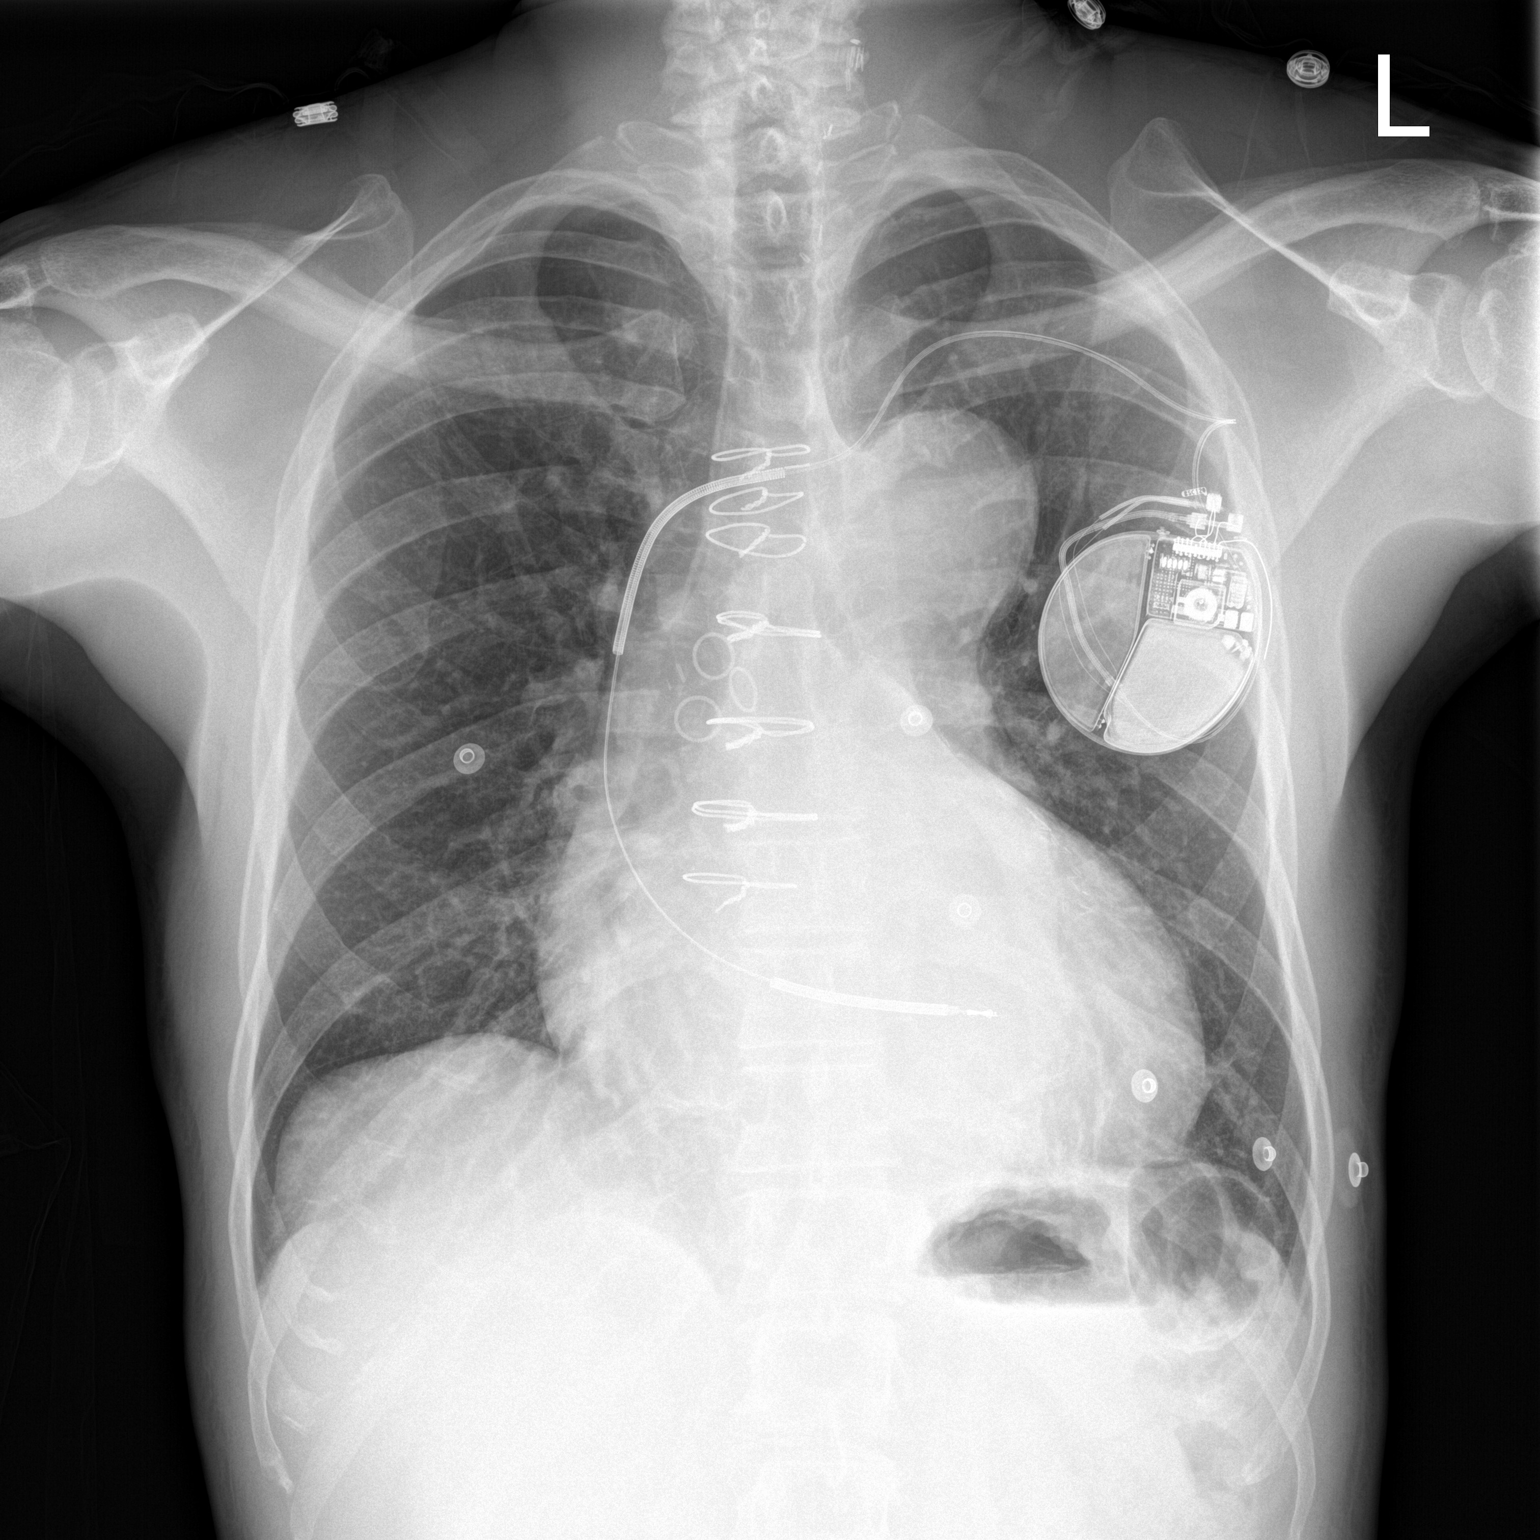

[chest lat]
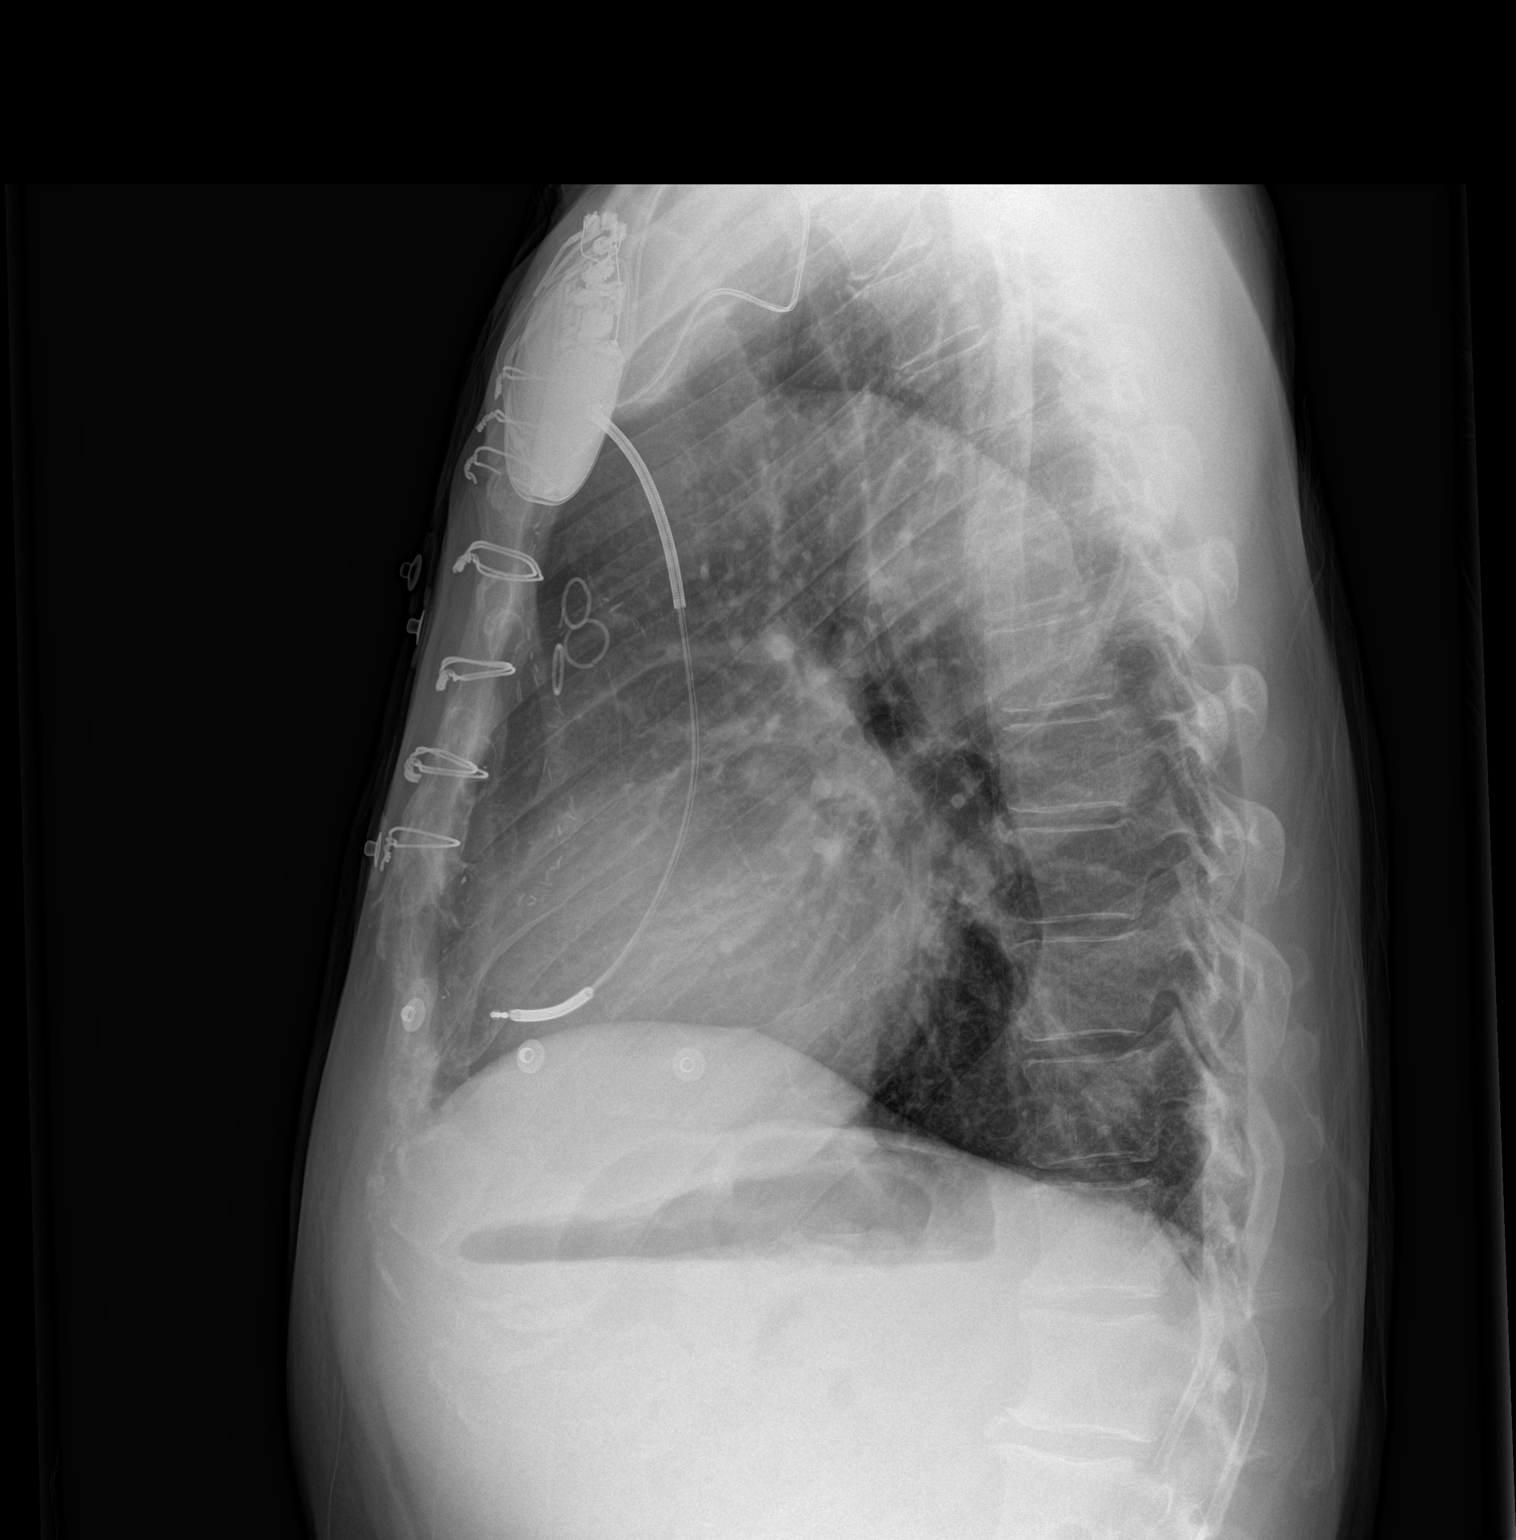

[2 of 2 positions shown; findings below may reference images not displayed]

FINDINGS: LEFT subclavian AICD lead tip projects over RIGHT ventricle.

Enlargement of cardiac silhouette post CABG.

Calcified tortuous thoracic aorta, aortic arch remaining prominent
in size similar to prior study.

Pulmonary vascularity normal.

Subsegmental atelectasis medial LEFT lower lobe.

Lungs otherwise clear.

No pleural effusion or pneumothorax.

Bones unremarkable.
IMPRESSION: Enlargement of cardiac silhouette post CABG and AICD.

Subsegmental atelectasis medial LEFT lower lobe.

## 2015-07-24 IMAGING — US US ABDOMEN LIMITED
1 series · 14 of 25 positions shown · non-contrast
Comparison: CT abdomen pelvis 10/11/2014

CLINICAL DATA: Abdominal pain, hypertension, coronary artery
disease post CABG, chronic systolic heart failure

EXAM:
US ABDOMEN LIMITED - RIGHT UPPER QUADRANT

[Series 1: us abdomen limited · 0.24mm/px · 14 of 32 slices shown]
[im 1/32]
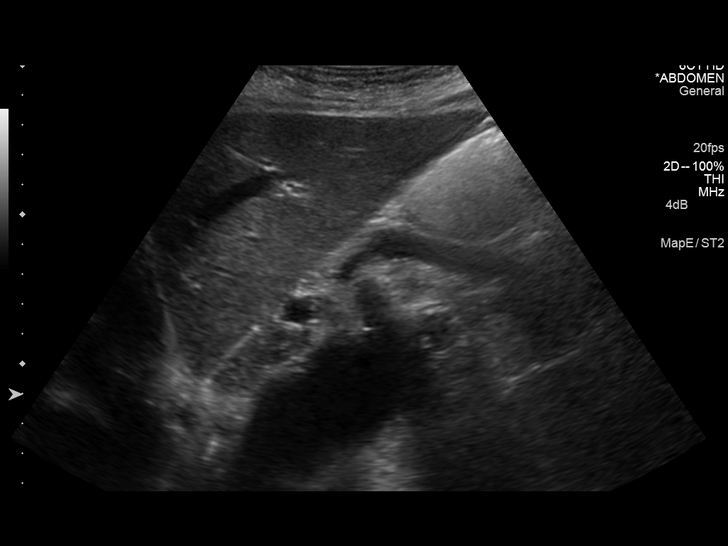
[im 3/32]
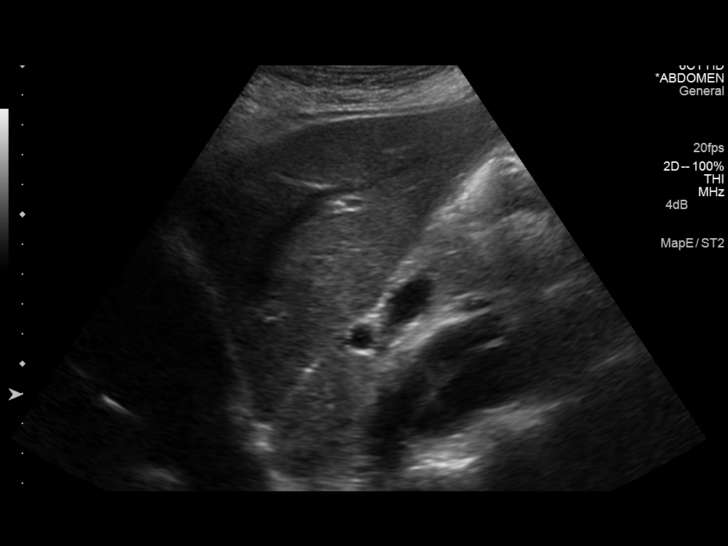
[im 6/32]
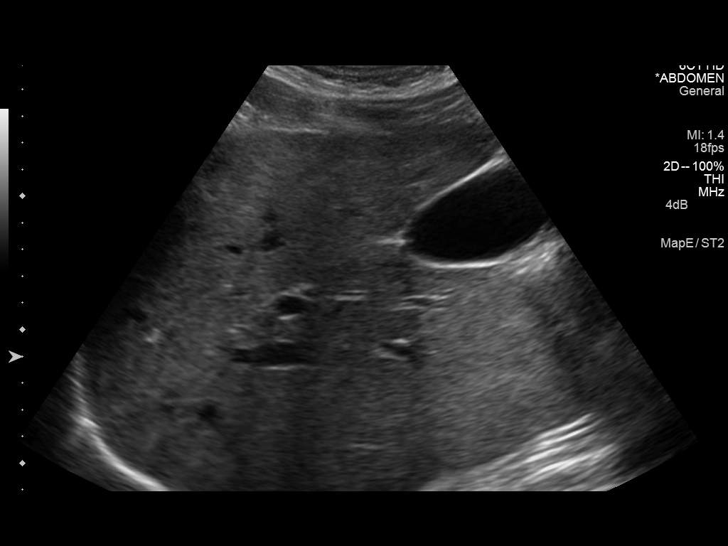
[im 8/32]
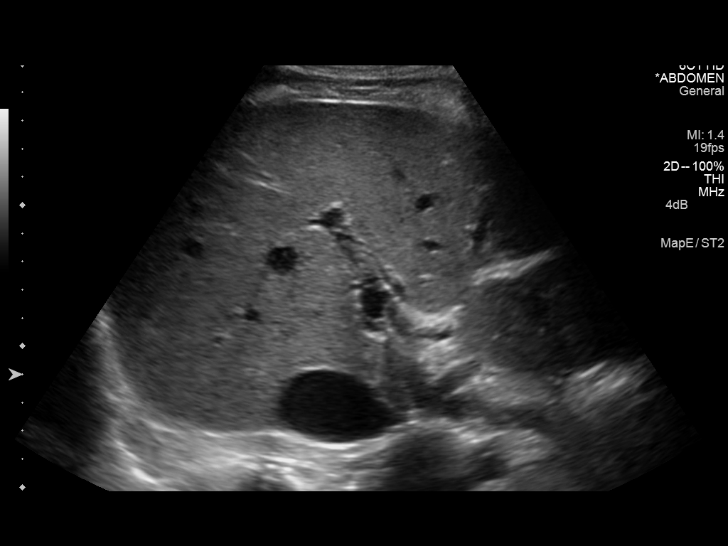
[im 11/32]
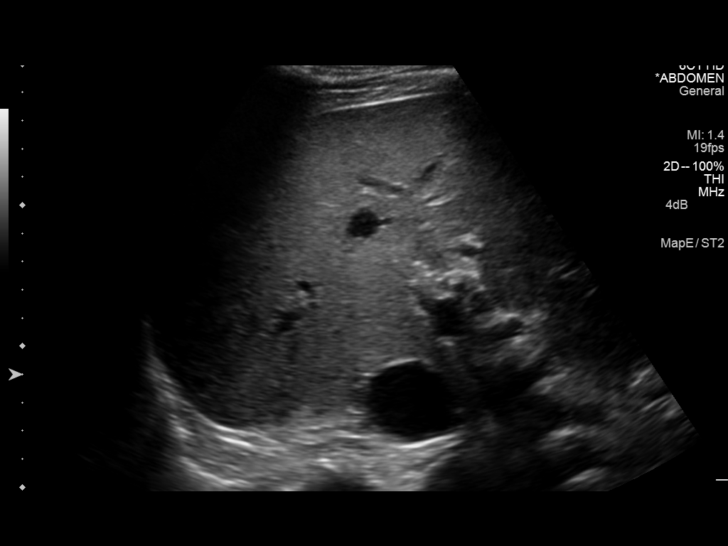
[im 12/32]
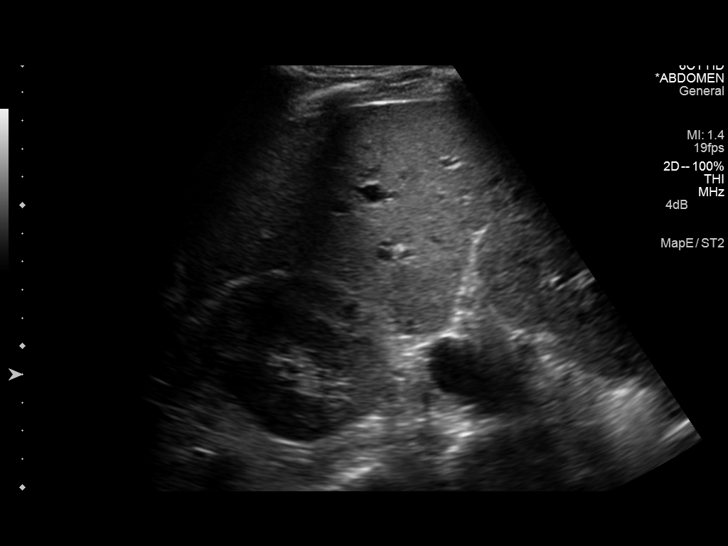
[im 15/32]
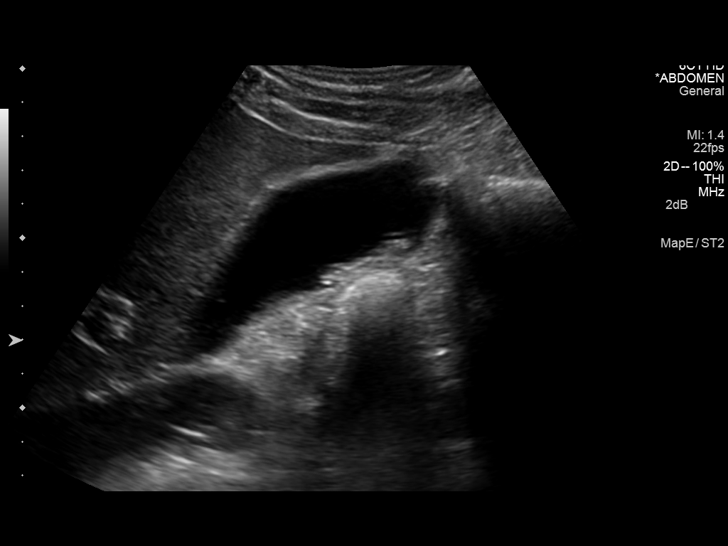
[im 17/32]
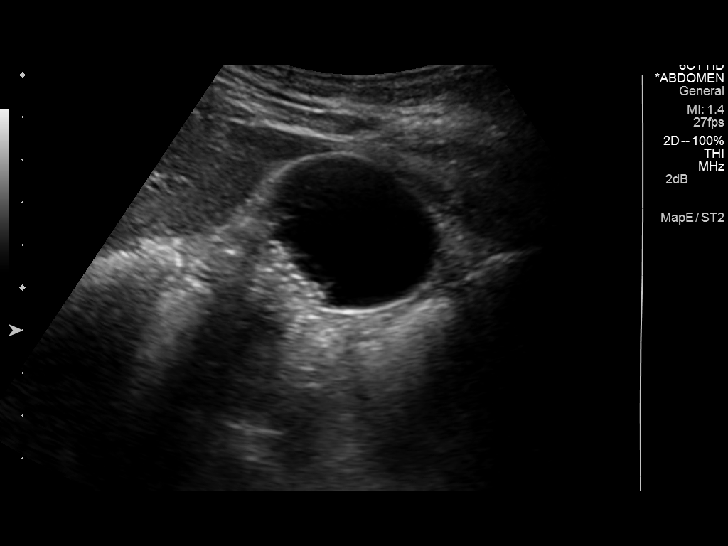
[im 20/32]
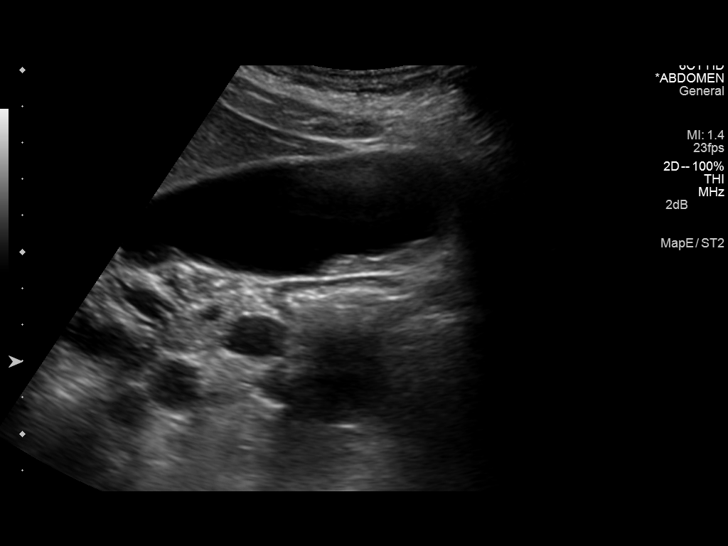
[im 21/32]
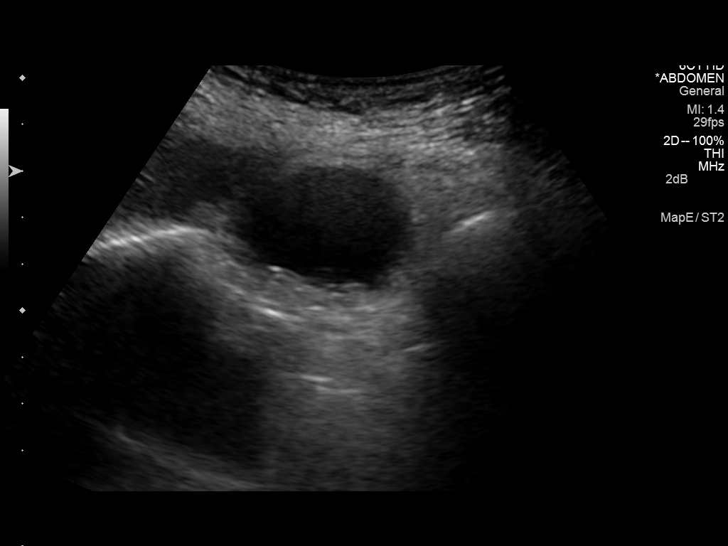
[im 24/32]
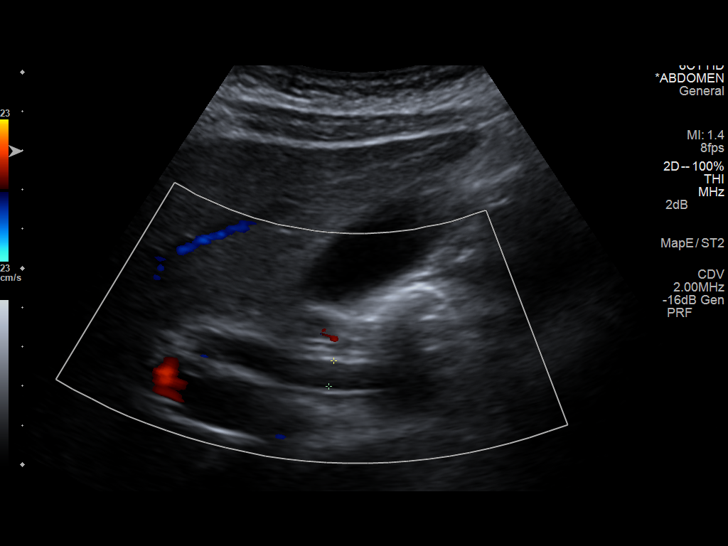
[im 26/32]
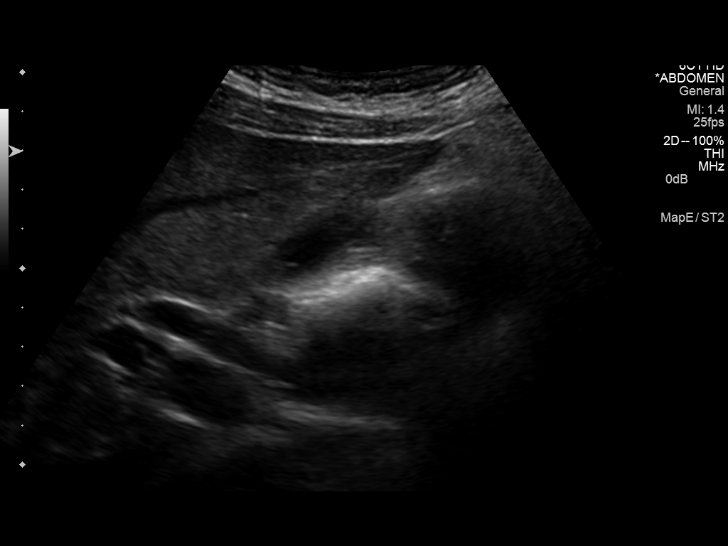
[im 29/32]
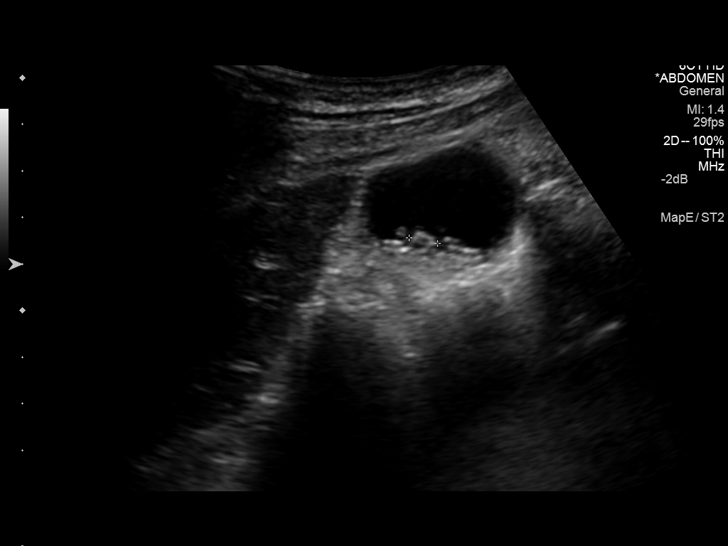
[im 32/32]
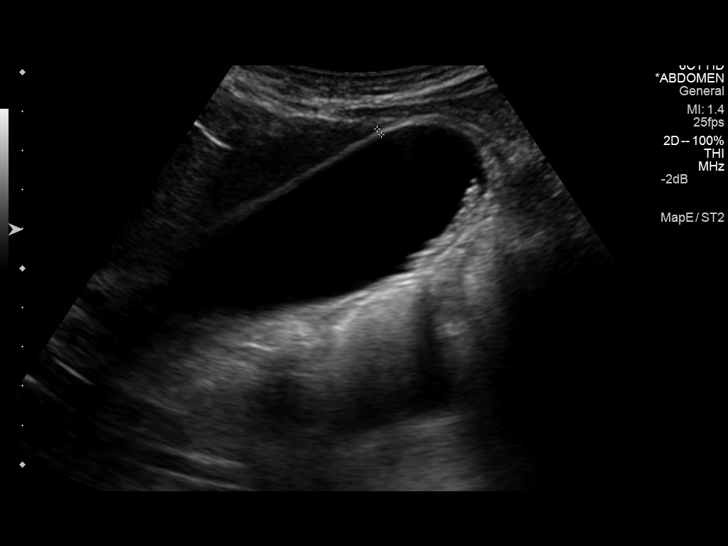

[14 of 25 positions shown; findings below may reference images not displayed]

FINDINGS: Gallbladder:

The dependent echogenic material is seen within the gallbladder,
some which does not shadow images sternal which does cause mild
shadowing. This likely represents a combination of shaft and non
shadowing calculi up to 6 mm diameter. Additional gallbladder
sludge. No gallbladder wall thickening, pericholecystic fluid or
sonographic Murphy sign.

Common bile duct:

Diameter: 7 mm diameter, may be normal for age.

Liver:

Normal appearance

No RIGHT upper quadrant free fluid.

Technologist did note generalized tenderness in the RIGHT upper
quadrant in the region of the gallbladder with imaging.
IMPRESSION: Cholelithiasis and sludge within gallbladder without definite
evidence o facute cholecystitis.

## 2015-07-26 ENCOUNTER — Inpatient Hospital Stay (HOSPITAL_COMMUNITY): Admission: RE | Admit: 2015-07-26 | Payer: Medicaid Other | Source: Ambulatory Visit

## 2015-07-31 ENCOUNTER — Other Ambulatory Visit (HOSPITAL_COMMUNITY): Payer: Self-pay | Admitting: Adult Health

## 2015-08-03 ENCOUNTER — Other Ambulatory Visit (HOSPITAL_COMMUNITY): Payer: Self-pay | Admitting: *Deleted

## 2015-08-08 IMAGING — CT CT ABD-PELV W/O CM
2 of 4 series · 16 of 46 positions shown, 18 images · non-contrast
Comparison: 10/11/2014

CLINICAL DATA: Bilateral inguinal swelling. Left larger than right.

EXAM:
CT ABDOMEN AND PELVIS WITHOUT CONTRAST
TECHNIQUE: Multidetector CT imaging of the abdomen and pelvis was performed
following the standard protocol without IV contrast.

[Series 2: abd/ pelvis 5.0 i30f 1 · axial · 0.70mm/px · z∈[-482,-42]mm · 13 of 98 slices shown, 15 images]
[im 5/98  soft-tissue]
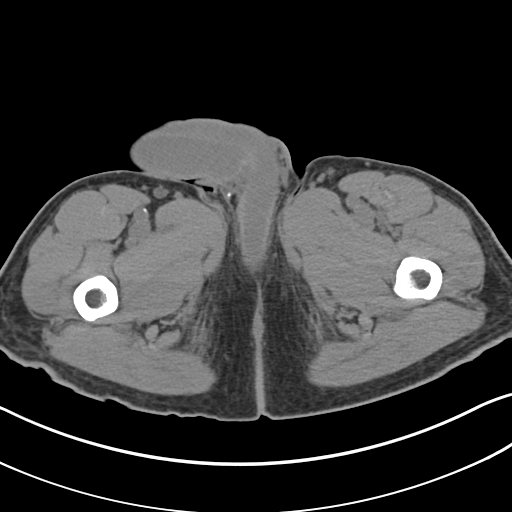
[im 5/98  bone]
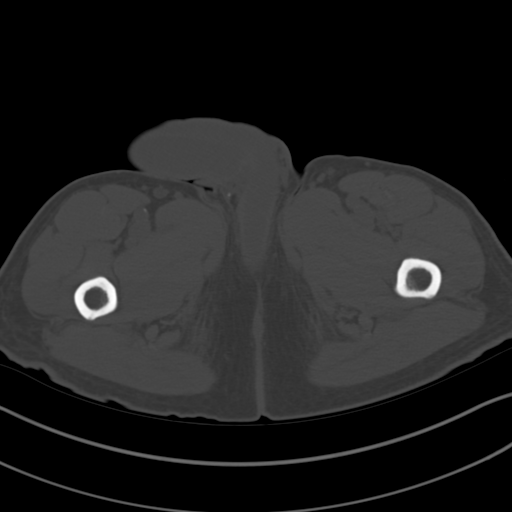
[im 13/98  soft-tissue]
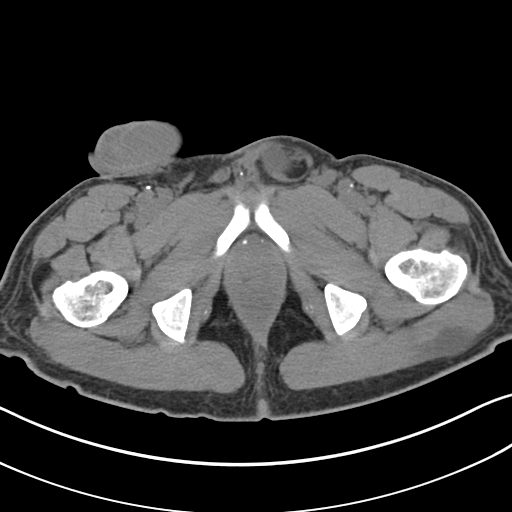
[im 21/98  soft-tissue]
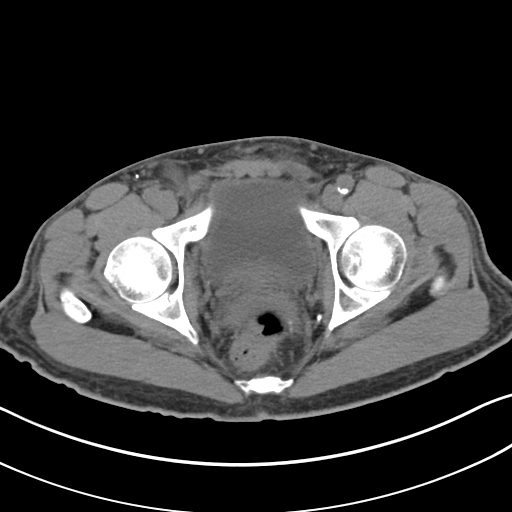
[im 29/98  soft-tissue]
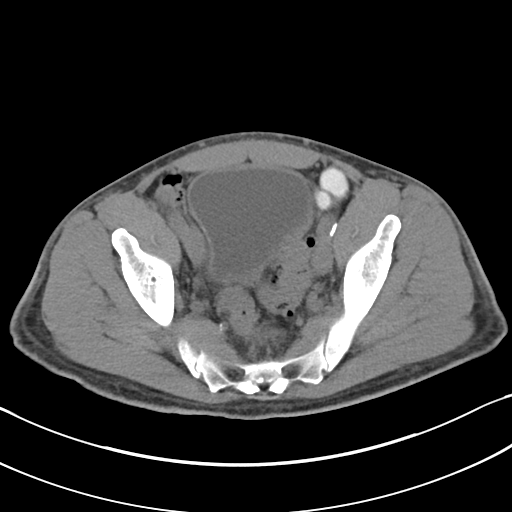
[im 33/98  soft-tissue]
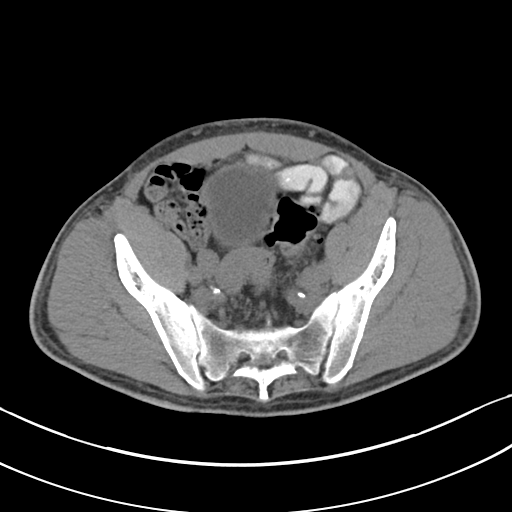
[im 41/98  soft-tissue]
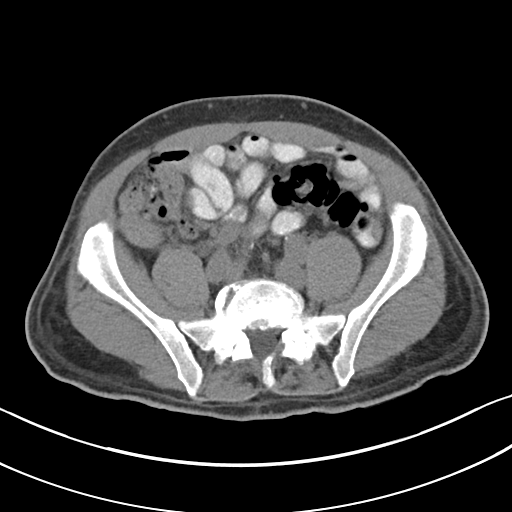
[im 49/98  soft-tissue]
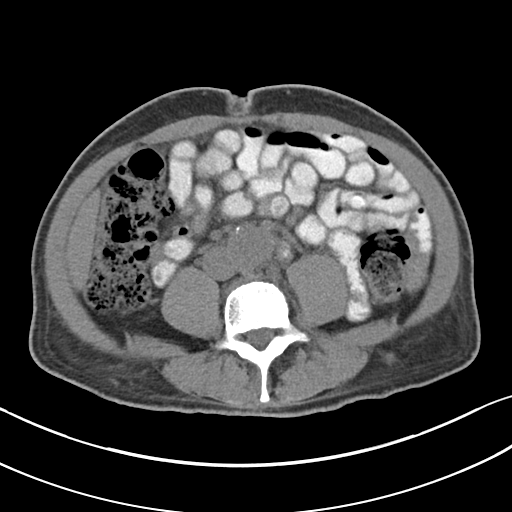
[im 57/98  soft-tissue]
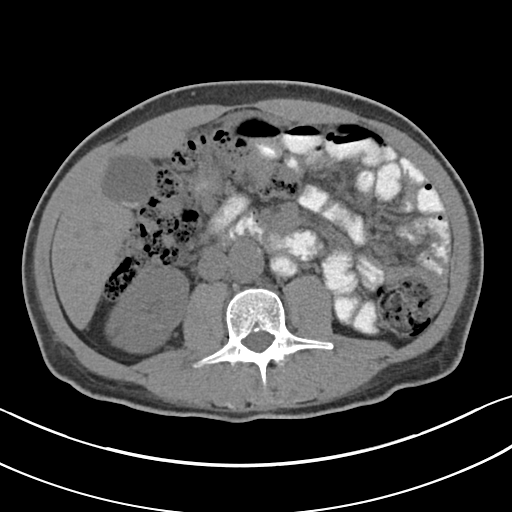
[im 65/98  soft-tissue]
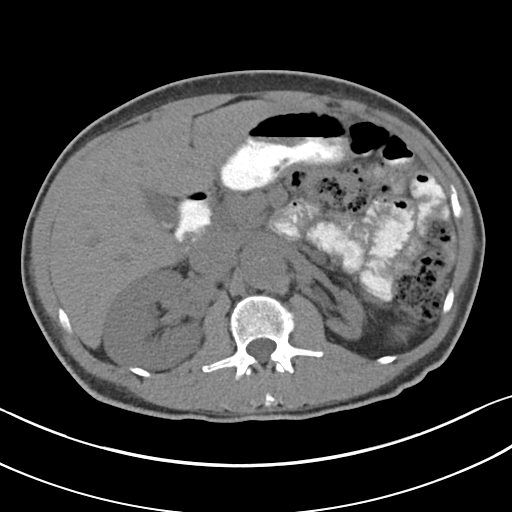
[im 65/98  bone]
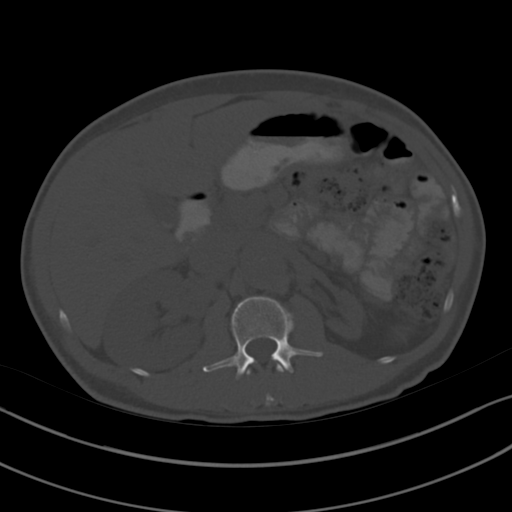
[im 69/98  soft-tissue]
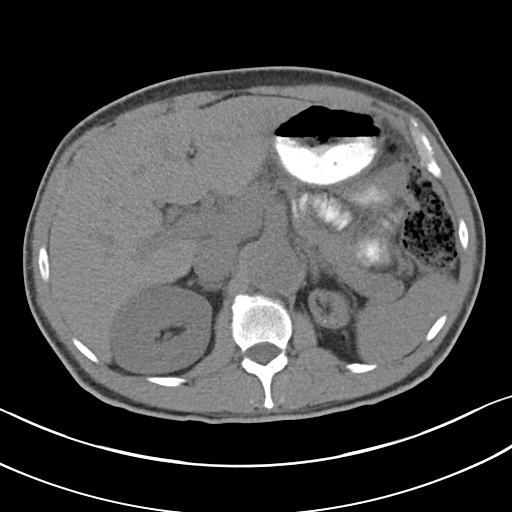
[im 77/98  soft-tissue]
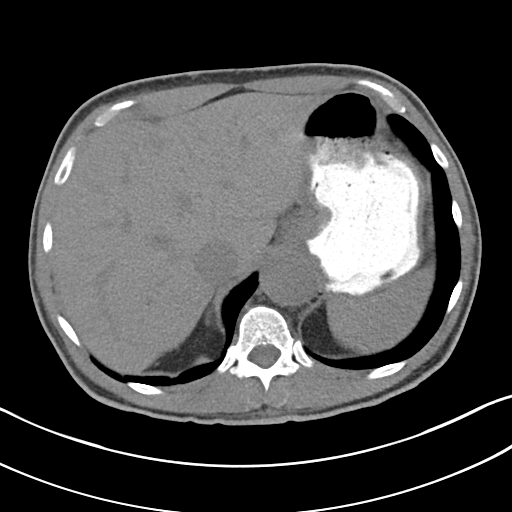
[im 85/98  soft-tissue]
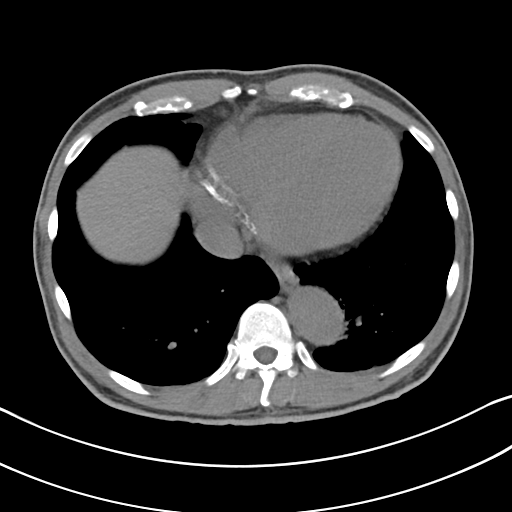
[im 93/98  soft-tissue]
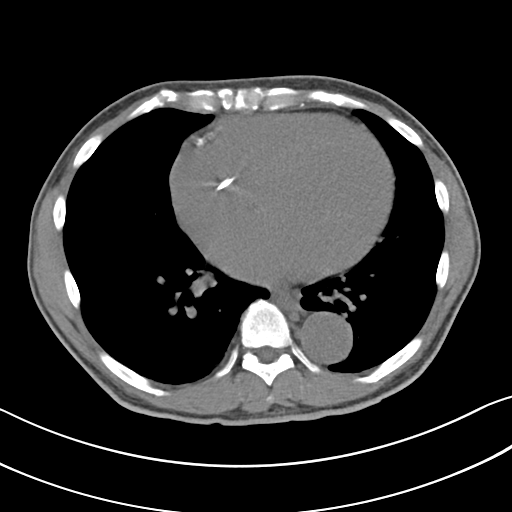

[Series 5: coronals · coronal · 0.70mm/px · 3 of 118 slices shown]
[im 40/118  soft-tissue]
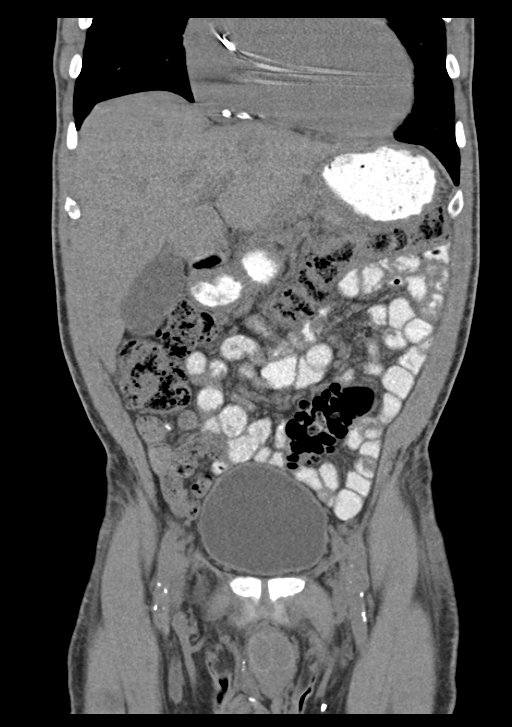
[im 53/118  soft-tissue]
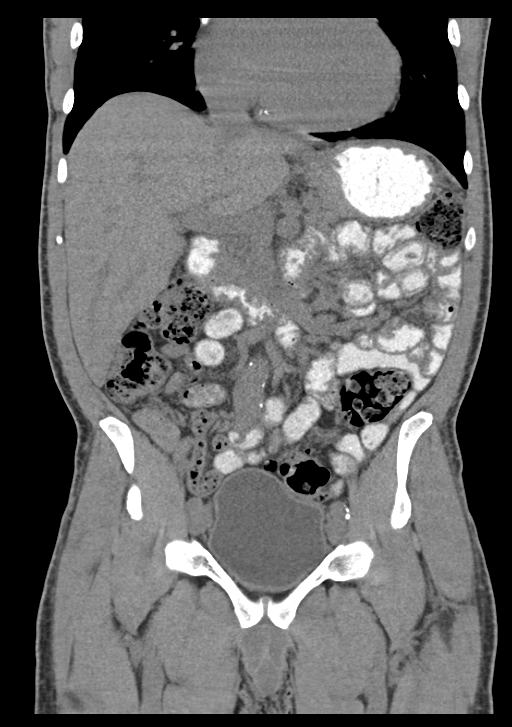
[im 66/118  soft-tissue]
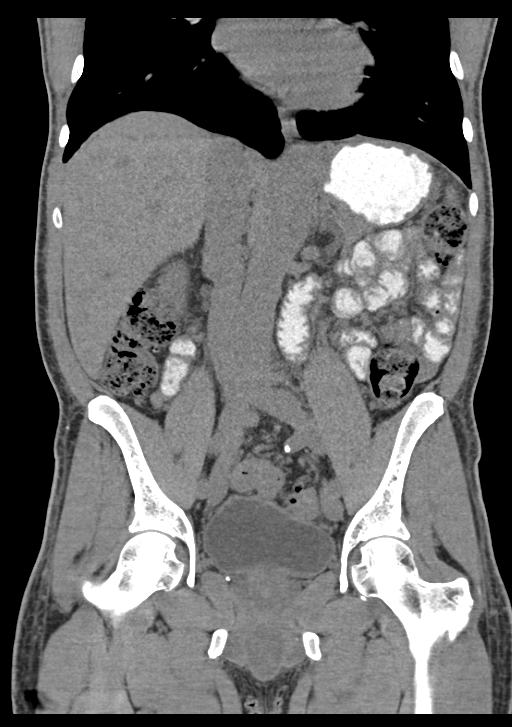

[16 of 46 positions shown; findings below may reference images not displayed]

FINDINGS: There is cardiomegaly. Pacer wires are noted in the right heart.
Areas of scarring in the lung bases. No effusions.

Left kidney is severely atrophic, stable since prior CT. Small
layering gallstones within the gallbladder. Liver, right kidney,
adrenals, pancreas, spleen are unremarkable on this unenhanced
study.

Large stool burden throughout the colon. Sigmoid diverticulosis. No
active diverticulitis. Stomach and small bowel are decompressed,
unremarkable.

There are bilateral inguinal hernias noted, both containing fluid.
No bowel within the hernias.

Urinary bladder is unremarkable.

Prominent abdominal aorta, measuring 4.0 cm at the aortic hiatus,
stable since prior study. The previously seen dissection cannot be
visualized on this unenhanced study. However, calcifications noted
within the wall and intimal flap in the infrarenal abdominal aorta
are noted and are stable since prior study.

No free fluid, free air or adenopathy. No acute bony abnormality or
focal bone lesion. Degenerative changes within the lumbar spine,
most pronounced at L4-5 and L5-S1.
IMPRESSION: Bilateral inguinal hernias, small on the right and moderate on the
left containing fluid. No bowel within the hernias.

Large stool burden in the colon.  Sigmoid diverticulosis.

Cholelithiasis.

Stable aortic diameter, 4 cm at the aortic hiatus. Unable to
visualize the dissection seen on prior study on today's study which
was performed without intravenous contrast.

Stable severely atrophic left kidney.

## 2015-08-19 ENCOUNTER — Other Ambulatory Visit (HOSPITAL_COMMUNITY): Payer: Self-pay | Admitting: Adult Health

## 2015-08-20 ENCOUNTER — Encounter: Payer: Self-pay | Admitting: *Deleted

## 2015-08-26 ENCOUNTER — Other Ambulatory Visit (HOSPITAL_COMMUNITY): Payer: Self-pay | Admitting: Internal Medicine

## 2015-09-14 ENCOUNTER — Encounter: Payer: Self-pay | Admitting: *Deleted

## 2015-09-18 ENCOUNTER — Other Ambulatory Visit: Payer: Self-pay | Admitting: Nurse Practitioner

## 2015-09-18 MED ORDER — ISOSORBIDE MONONITRATE ER 60 MG PO TB24: 60.0000 mg | ORAL_TABLET | Freq: Two times a day (BID) | ORAL | Status: DC

## 2015-09-22 ENCOUNTER — Other Ambulatory Visit: Payer: Self-pay | Admitting: Physician Assistant

## 2015-09-22 ENCOUNTER — Other Ambulatory Visit (HOSPITAL_COMMUNITY): Payer: Self-pay | Admitting: Internal Medicine

## 2015-09-22 ENCOUNTER — Telehealth: Payer: Self-pay | Admitting: Physician Assistant

## 2015-09-22 MED ORDER — CARVEDILOL 25 MG PO TABS: 25.0000 mg | ORAL_TABLET | Freq: Two times a day (BID) | ORAL | Status: DC

## 2015-09-22 NOTE — Telephone Encounter (Signed)
The patient called stating he was out of coreg.  Script sent.  Tarri Fuller PAC

## 2015-09-28 ENCOUNTER — Encounter (HOSPITAL_COMMUNITY): Payer: Self-pay | Admitting: Internal Medicine

## 2015-09-28 ENCOUNTER — Ambulatory Visit (HOSPITAL_COMMUNITY)
Admission: RE | Admit: 2015-09-28 | Discharge: 2015-09-28 | Disposition: A | Discharge status: Home / Self Care | Payer: Medicaid Other | Source: Ambulatory Visit | Attending: Internal Medicine | Admitting: Internal Medicine

## 2015-09-28 VITALS — BP 132/76 | HR 82 | Wt 180.4 lb

## 2015-09-28 DIAGNOSIS — Z95810 Presence of automatic (implantable) cardiac defibrillator: Secondary | ICD-10-CM | POA: Diagnosis not present

## 2015-09-28 DIAGNOSIS — I2510 Atherosclerotic heart disease of native coronary artery without angina pectoris: Secondary | ICD-10-CM | POA: Diagnosis not present

## 2015-09-28 DIAGNOSIS — Z91040 Latex allergy status: Secondary | ICD-10-CM | POA: Insufficient documentation

## 2015-09-28 DIAGNOSIS — Z882 Allergy status to sulfonamides status: Secondary | ICD-10-CM | POA: Insufficient documentation

## 2015-09-28 DIAGNOSIS — I7103 Dissection of thoracoabdominal aorta: Secondary | ICD-10-CM | POA: Diagnosis not present

## 2015-09-28 DIAGNOSIS — Z7902 Long term (current) use of antithrombotics/antiplatelets: Secondary | ICD-10-CM | POA: Insufficient documentation

## 2015-09-28 DIAGNOSIS — Z951 Presence of aortocoronary bypass graft: Secondary | ICD-10-CM | POA: Diagnosis not present

## 2015-09-28 DIAGNOSIS — N189 Chronic kidney disease, unspecified: Secondary | ICD-10-CM | POA: Diagnosis not present

## 2015-09-28 DIAGNOSIS — Z9119 Patient's noncompliance with other medical treatment and regimen: Secondary | ICD-10-CM | POA: Diagnosis not present

## 2015-09-28 DIAGNOSIS — Z7982 Long term (current) use of aspirin: Secondary | ICD-10-CM | POA: Insufficient documentation

## 2015-09-28 DIAGNOSIS — I5022 Chronic systolic (congestive) heart failure: Secondary | ICD-10-CM | POA: Insufficient documentation

## 2015-09-28 DIAGNOSIS — I129 Hypertensive chronic kidney disease with stage 1 through stage 4 chronic kidney disease, or unspecified chronic kidney disease: Secondary | ICD-10-CM | POA: Diagnosis not present

## 2015-09-28 DIAGNOSIS — R079 Chest pain, unspecified: Secondary | ICD-10-CM | POA: Diagnosis not present

## 2015-09-28 DIAGNOSIS — E785 Hyperlipidemia, unspecified: Secondary | ICD-10-CM | POA: Insufficient documentation

## 2015-09-28 DIAGNOSIS — I255 Ischemic cardiomyopathy: Secondary | ICD-10-CM | POA: Insufficient documentation

## 2015-09-28 LAB — LIPID PANEL
CHOL/HDL RATIO: 3.4 ratio
CHOLESTEROL: 182 mg/dL (ref 0–200)
HDL: 54 mg/dL (ref >40)
LDL Cholesterol: 107 mg/dL — ABNORMAL HIGH (ref 0–99)
TRIGLYCERIDES: 104 mg/dL (ref <150)
VLDL: 21 mg/dL (ref 0–40)

## 2015-09-28 LAB — COMPREHENSIVE METABOLIC PANEL
ALT: 15 U/L — ABNORMAL LOW (ref 17–63)
ANION GAP: 7 (ref 5–15)
AST: 20 U/L (ref 15–41)
Albumin: 4.3 g/dL (ref 3.5–5.0)
Alkaline Phosphatase: 55 U/L (ref 38–126)
BILIRUBIN TOTAL: 0.9 mg/dL (ref 0.3–1.2)
BUN: 12 mg/dL (ref 6–20)
CALCIUM: 8.9 mg/dL (ref 8.9–10.3)
CO2: 26 mmol/L (ref 22–32)
Chloride: 105 mmol/L (ref 101–111)
Creatinine, Ser: 1.38 mg/dL — ABNORMAL HIGH (ref 0.61–1.24)
GFR calc Af Amer: >60 mL/min (ref >60)
GFR, EST NON AFRICAN AMERICAN: 57 mL/min — AB (ref >60)
Glucose, Bld: 86 mg/dL (ref 65–99)
POTASSIUM: 4.1 mmol/L (ref 3.5–5.1)
Sodium: 138 mmol/L (ref 135–145)
TOTAL PROTEIN: 7.4 g/dL (ref 6.5–8.1)

## 2015-09-28 MED ORDER — FUROSEMIDE 40 MG PO TABS: 40.0000 mg | ORAL_TABLET | ORAL | Status: DC

## 2015-09-28 MED ORDER — AMLODIPINE BESYLATE 5 MG PO TABS: 5.0000 mg | ORAL_TABLET | Freq: Two times a day (BID) | ORAL | Status: DC

## 2015-09-28 MED ORDER — HYDRALAZINE HCL 50 MG PO TABS: 75.0000 mg | ORAL_TABLET | Freq: Three times a day (TID) | ORAL | Status: DC

## 2015-09-28 MED ORDER — EZETIMIBE 10 MG PO TABS: 10.0000 mg | ORAL_TABLET | Freq: Every day | ORAL | Status: DC

## 2015-09-28 MED ORDER — POTASSIUM CHLORIDE ER 10 MEQ PO TBCR: 10.0000 meq | EXTENDED_RELEASE_TABLET | Freq: Every day | ORAL | Status: DC

## 2015-09-28 MED ORDER — CLOPIDOGREL BISULFATE 75 MG PO TABS: 75.0000 mg | ORAL_TABLET | Freq: Every day | ORAL | Status: DC

## 2015-09-28 MED ORDER — ISOSORBIDE MONONITRATE ER 60 MG PO TB24: 60.0000 mg | ORAL_TABLET | Freq: Two times a day (BID) | ORAL | Status: DC

## 2015-09-28 MED ORDER — NITROGLYCERIN 0.4 MG SL SUBL: 0.4000 mg | SUBLINGUAL_TABLET | SUBLINGUAL | Status: DC | PRN

## 2015-09-28 NOTE — Progress Notes (Signed)
Advanced Heart Failure Medication Review by a Pharmacist  Does the patient  feel that his/her medications are working for him/her?  yes  Has the patient been experiencing any side effects to the medications prescribed?  yes  Does the patient measure his/her own blood pressure or blood glucose at home?  no   Does the patient have any problems obtaining medications due to transportation or finances?   no  Understanding of regimen: poor Understanding of indications: poor Potential of compliance: poor Patient understands to avoid NSAIDs. Patient understands to avoid decongestants.  Issues to address at subsequent visits: Compliance   Pharmacist comments:  [name] is a 54 yo M presenting with his medication bottles. He is not taking many of his medications as prescribed and just takes them as he feels necessary. He states that if his blood pressure is high is will take an additional Coreg and/or Imdur. He feels that his amlodipine is causing a cough so he may or may not take this on a daily basis. Also, if he is cramping he will take a "handful" of his OTC potassium gluconate. He also admits to not taking aspirin for at least 1-2 months d/t running out of the medication and only takes his Plavix when he remembers. We discussed, at length, the importance of using his medications as prescribed to reduce his risk of complications from his CAD/HF. I will continue to work with him to encourage compliance.   [name]. [name], PharmD, BCPS, CPP Clinical Pharmacist Pager: [phone] Phone: [phone] 09/28/2015 4:15 PM    Time with patient: 15 minutes  Preparation and documentation time: 10 minutes Total time: 25 minutes

## 2015-09-28 NOTE — Patient Instructions (Signed)
Return in 2 months  Echo as scheduled prior to next visit  Start taking potasium as prescribed   Take Lasix 40 mg daily for 3 days, 10/4, 10/5, 10/6 and then every other day  Refills have been sent in  Lipids and metabolic panel have been drawn today

## 2015-09-28 NOTE — Progress Notes (Signed)
Advanced Heart Failure Clinic Note   Patient ID: Chad [name], male   DOB: March 16, 1961, 54 y.o.   MRN: [id number]  History of Present Illness: Primary Cardiologist:  Dr. Demetrios Loll is a 54 y.o. male with history of severe HTN, coronary artery disease status post previous myocardial infarction and bypass surgery in 2006 with DES to native PDA in 2011.  He also has a history congestive heart failure secondary to ischemic cardiomyopathy   He is s/p single chamber [employer] ICD.  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 He underwent emergent repair of proximal aorta and reimplantation of his CABG grafts.  Carotids u/s in 2011 0-39%.  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 Also with significant low back pain s/p 2 surgeries.   ETT/Myoview in 9/12: Walked 8:46 on treadmill. Moderate-sized fixed inferior perfusion defect consistent with prior MI. EF 31% with septal hypokinesis. There was also inferior hypokinesis. Similar to prior study.   11/26/12 ECHO EF 35-40%   He was admitted in 2/16 with chest pain.  Echo with EF 25%, diffuse hypokinesis, 4.5 cm aortic root, RV mildly dilated with mildly decreased systolic function.  Cardiolite showed inferior infarction with mild peri-infarct ischemia, EF 27%.   He returns today for regular follow up. He is a poor historian. At last visit we increased his hydralazine and added spironolactone.  He is up 15 lbs since then. He had been working out of town and eating only fast food with lots of salt x 1 month. Is trying to eat better now. BPs at home high, but refuses to give me a number. He adjusts his medicines individually each day depending on how he feels.  He refuses to take them as directed saying "if I did I would die." He has needed his lasix every other day for the past few weeks. Has atypical chest pain that worsens as his fluid worsens. No orthopnea/PND. Normally  has no DOE, but for the past month gets SOB after about 100 yards, worse on days when his BP is high.    Labs 11/25/13 Cholesterol 186 TG 121 HDL 49  Labs 12/09/13 K 3.6 creatinine 1.3  Labs 3/15 K 4.1, creatinine 1.4, BNP 589, HCT 39.8 Labs 3/16 K 3.8, creatinine 1.56, HCT 36.5  ROS: All pertinent positives and negatives as in HPI, otherwise negative.    Past Medical History  Diagnosis Date  . CAD (coronary artery disease)     a. s/p CABG 2006;  b. DES to PDA 2011 (cath: Dx not seen, dRCA/PDA tx with DES; S-PDA occluded (culprit), S-Dx occluded, S-RI and OM ok, L-LAD ok  . HTN (hypertension)     severe  . Chronic systolic heart failure (HCC)     a. 12/13 ECHO: EF 35-40%, sept, apical & posterobasal HK, LV mod dil & sys fx mod reduced, mild AI, MV mild reg, TV mild reg  . Aortic dissection, thoracoabdominal ([place])     7/10: Type I s/p repair  . CRI (chronic renal insufficiency)   . 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 HLD (hyperlipidemia)   . Anemia   . Gout   . AICD (automatic cardioverter/defibrillator) present   . Carotid stenosis     dopplers 2011: 0-39% bilat.  . Chest pain syndrome     Current Outpatient Prescriptions  Medication Sig Dispense Refill  . amLODipine (NORVASC) 5 MG tablet Take  1 tablet (5 mg total) by mouth 2 (two) times daily. 60 tablet 6  . aspirin EC 81 MG tablet Take 1 tablet (81 mg total) by mouth daily. 90 tablet 3  . b complex vitamins tablet Take 2 tablets by mouth daily.     . carvedilol (COREG) 25 MG tablet TAKE ONE TABLET BY MOUTH TWICE DAILY WITH  MEALS. 60 tablet 0  . carvedilol (COREG) 25 MG tablet TAKE ONE TABLET BY MOUTH TWICE DAILY WITH  MEALS. 60 tablet 0  . carvedilol (COREG) 25 MG tablet Take 1 tablet (25 mg total) by mouth 2 (two) times daily with a meal. 60 tablet 6  . clopidogrel (PLAVIX) 75 MG tablet Take 1 tablet (75 mg total) by mouth at bedtime. 30 tablet 6  . cyclobenzaprine (FLEXERIL) 10 MG tablet Take 10 mg by mouth daily as  needed for muscle spasms.     . diphenhydrAMINE (BENADRYL) 25 MG tablet Take 12.5 mg by mouth 2 (two) times daily as needed for itching or allergies.    [name] ezetimibe (ZETIA) 10 MG tablet Take 1 tablet (10 mg total) by mouth daily. 30 tablet 6  . furosemide (LASIX) 20 MG tablet Take 1 tablet (20 mg total) by mouth every other day. 15 tablet 3  . hydrALAZINE (APRESOLINE) 50 MG tablet Take 1.5 tablets (75 mg total) by mouth 3 (three) times daily. 135 tablet 6  . HYDROcodone-acetaminophen (NORCO/VICODIN) 5-325 MG per tablet Take 1 tablet by mouth every 4 (four) hours as needed. (Patient taking differently: Take 1 tablet by mouth every 4 (four) hours as needed (breakthrough pain). ) 10 tablet 0  . isosorbide mononitrate (IMDUR) 60 MG 24 hr tablet Take 1 tablet (60 mg total) by mouth 2 (two) times daily. 60 tablet 3  . loratadine (CLARITIN) 10 MG tablet Take 1 tablet (10 mg total) by mouth daily as needed for allergies. 30 tablet 11  . Multiple Vitamin (MULTIVITAMIN WITH MINERALS) TABS tablet Take 1 tablet by mouth daily. Mega Men 50 Plus    . nitroGLYCERIN (NITROSTAT) 0.4 MG SL tablet Place 1 tablet (0.4 mg total) under the tongue every 5 (five) minutes x 3 doses as needed for chest pain. 30 tablet 12  . oxycodone (ROXICODONE) 30 MG immediate release tablet Take 30 mg by mouth every 8 (eight) hours. scheduled    . potassium chloride SA (K-DUR,KLOR-CON) 10 MEQ tablet Take 1 tablet (10 mEq total) by mouth every other day. 15 tablet 3  . spironolactone (ALDACTONE) 25 MG tablet Take 0.5 tablets (12.5 mg total) by mouth daily. 15 tablet 3   No current facility-administered medications for this encounter.    Allergies  Allergen Reactions  . Iohexol Anaphylaxis and Other (See Comments)     Desc: PT HAS ANAPHYLAXIS WITH CONTRAST MEDIA!  . Lipitor [Atorvastatin Calcium] Anaphylaxis and Other (See Comments)    Large doses  . Shellfish Allergy Anaphylaxis  . Sulfonamide Derivatives Shortness Of Breath  .  Latex Rash    With long periods of exposure  . Zocor [Simvastatin] Other (See Comments)    Muscle cramps    Vital Signs: Filed Vitals:   09/28/15 1436  BP: 132/76  Pulse: 82  Weight: 180 lb 6.4 oz (81.829 kg)  SpO2: 96%    Physical Exam: General:  Well appearing. No resp difficulty HEENT: normal Neck: supple. JVP 12 cm. Carotids 2+ bilat; carotid bruits L>R No lymphadenopathy or thryomegaly. Cor: PMI nondisplaced. Regular rate & rhythm. 2/6 SEM RUSB  Lungs:  CTA Abdomen: soft, nontender, mildly distended. No hepatosplenomegaly. No bruits or masses. Good bowel sounds. Extremities: no cyanosis, clubbing, rash. Trace - 1+ edema bilateral LEs Neuro: alert & orientedx3, cranial nerves grossly intact. moves all 4 extremities w/o difficulty. Affect flat  ASSESSMENT/Plan  1. Chronic systolic HF: Ischemic cardiomyopathy.  Echo (2/16) with EF 25%. S/p [employer] ICD. NYHA class II symptoms, Appears slightly volume overloaded on exam.   - Continue Lasix 20 mg every other day. Will have him take an extra dose or two over the next few days - On goal carvedilol 25 mg twice a day. Told not to take extra. - Currently taking hydralazine 75 mg bid on most days. Have instructed him to increase to tid. Continue Imdur 60 bid.  - Continue spironolactone 12.5 mg daily.  - Not on ACEI due to CKD and solitary kidney. - Reinforced the need and importance of daily weights, a low sodium diet, and fluid restriction (less than 2 L a day). Instructed to call the HF clinic if weight increases more than 3 lbs overnight or 5 lbs in a week.  - Echo overdue. 2. CAD: s/p CABG.  Occasional atypical chest pain, not related to activity.   - Cardiolite in 2/16 with inferior scar c/w prior MI, no significant ischemia. Would have very high threshold to cath with chronic aoritc dissection and CKD with solitary kidney. - Continue Plavix and ASA 81 mg daily.   - Statin intolerance at any dose.  - Continue Zetia. Last  lipids 09/2014 (normal). Will check lipids today. 3. CKD in setting of solitary kidney s/p dissection.:CMET today. 4. HTN: - mildly elevated today. Encouraged to stick to medical regimen as outlined. 5. H/o Type I aortic dissection: Follows with CVTS.  6. Non-compliance  It is difficult to tell what all patient is taking and how he is taking it, as he self adjust his medicines daily.  HF pharmacist discussed his meds in detail and the importance of compliance.  He outright states he will continue adjusting his medications as he sees fit.  Increase K to 10 meq daily. Told not to take extra coreg, as he is at goal dose, and to try and take his hydralazine TID.   Should take lasix for the next few days, and then every other day with extra as needed for weight gain 3 lbs.   CMET and lipids today.  Will order Echo.  Follow up in 2 months or sooner prn.   Shirley Friar PA-C   Patient seen and examined with Oda Kilts, PA-C. We discussed all aspects of the encounter. I agree with the assessment and plan as stated above.   Overall relatively stable. NYHA I-II. Volume status slightly elevated. Agree with having him take hydralazine tid (versus bid) whenever possible. Take extra lasix as needed. Plan repeat echo. Check labs today.   Dewan Emond,MD 11:29 PM

## 2015-10-01 ENCOUNTER — Other Ambulatory Visit (HOSPITAL_COMMUNITY): Payer: Medicaid Other

## 2015-10-04 ENCOUNTER — Encounter (HOSPITAL_COMMUNITY): Payer: Self-pay | Admitting: *Deleted

## 2015-10-04 ENCOUNTER — Emergency Department (HOSPITAL_COMMUNITY): Payer: Medicaid Other

## 2015-10-04 ENCOUNTER — Other Ambulatory Visit: Payer: Self-pay

## 2015-10-04 ENCOUNTER — Emergency Department (HOSPITAL_COMMUNITY)
Admission: EM | Admit: 2015-10-04 | Discharge: 2015-10-05 | Disposition: A | Discharge status: Home / Self Care | Payer: Medicaid Other | Attending: Emergency Medicine | Admitting: Emergency Medicine

## 2015-10-04 DIAGNOSIS — I5022 Chronic systolic (congestive) heart failure: Secondary | ICD-10-CM | POA: Insufficient documentation

## 2015-10-04 DIAGNOSIS — E785 Hyperlipidemia, unspecified: Secondary | ICD-10-CM | POA: Insufficient documentation

## 2015-10-04 DIAGNOSIS — Z79899 Other long term (current) drug therapy: Secondary | ICD-10-CM | POA: Insufficient documentation

## 2015-10-04 DIAGNOSIS — I252 Old myocardial infarction: Secondary | ICD-10-CM | POA: Insufficient documentation

## 2015-10-04 DIAGNOSIS — I2510 Atherosclerotic heart disease of native coronary artery without angina pectoris: Secondary | ICD-10-CM | POA: Diagnosis not present

## 2015-10-04 DIAGNOSIS — Z87448 Personal history of other diseases of urinary system: Secondary | ICD-10-CM | POA: Diagnosis not present

## 2015-10-04 DIAGNOSIS — Z85850 Personal history of malignant neoplasm of thyroid: Secondary | ICD-10-CM | POA: Diagnosis not present

## 2015-10-04 DIAGNOSIS — Z95810 Presence of automatic (implantable) cardiac defibrillator: Secondary | ICD-10-CM | POA: Insufficient documentation

## 2015-10-04 DIAGNOSIS — K8020 Calculus of gallbladder without cholecystitis without obstruction: Secondary | ICD-10-CM | POA: Insufficient documentation

## 2015-10-04 DIAGNOSIS — R1013 Epigastric pain: Secondary | ICD-10-CM | POA: Diagnosis not present

## 2015-10-04 DIAGNOSIS — I10 Essential (primary) hypertension: Secondary | ICD-10-CM | POA: Diagnosis not present

## 2015-10-04 DIAGNOSIS — Z7982 Long term (current) use of aspirin: Secondary | ICD-10-CM | POA: Diagnosis not present

## 2015-10-04 DIAGNOSIS — Z7901 Long term (current) use of anticoagulants: Secondary | ICD-10-CM | POA: Insufficient documentation

## 2015-10-04 DIAGNOSIS — Z951 Presence of aortocoronary bypass graft: Secondary | ICD-10-CM | POA: Diagnosis not present

## 2015-10-04 DIAGNOSIS — Z862 Personal history of diseases of the blood and blood-forming organs and certain disorders involving the immune mechanism: Secondary | ICD-10-CM | POA: Insufficient documentation

## 2015-10-04 DIAGNOSIS — Z91040 Latex allergy status: Secondary | ICD-10-CM | POA: Insufficient documentation

## 2015-10-04 DIAGNOSIS — K8050 Calculus of bile duct without cholangitis or cholecystitis without obstruction: Secondary | ICD-10-CM

## 2015-10-04 DIAGNOSIS — I509 Heart failure, unspecified: Secondary | ICD-10-CM | POA: Diagnosis not present

## 2015-10-04 DIAGNOSIS — Z8739 Personal history of other diseases of the musculoskeletal system and connective tissue: Secondary | ICD-10-CM | POA: Diagnosis not present

## 2015-10-04 DIAGNOSIS — Z79891 Long term (current) use of opiate analgesic: Secondary | ICD-10-CM | POA: Diagnosis not present

## 2015-10-04 DIAGNOSIS — R079 Chest pain, unspecified: Secondary | ICD-10-CM | POA: Diagnosis present

## 2015-10-04 LAB — CBC
HCT: 40.5 % (ref 39.0–52.0)
HEMOGLOBIN: 13.3 g/dL (ref 13.0–17.0)
MCH: 27.8 pg (ref 26.0–34.0)
MCHC: 32.8 g/dL (ref 30.0–36.0)
MCV: 84.6 fL (ref 78.0–100.0)
PLATELETS: 141 10*3/uL — AB (ref 150–400)
RBC: 4.79 MIL/uL (ref 4.22–5.81)
RDW: 14.3 % (ref 11.5–15.5)
WBC: 3.4 10*3/uL — AB (ref 4.0–10.5)

## 2015-10-04 LAB — BASIC METABOLIC PANEL
ANION GAP: 10 (ref 5–15)
BUN: 15 mg/dL (ref 6–20)
CHLORIDE: 102 mmol/L (ref 101–111)
CO2: 26 mmol/L (ref 22–32)
CREATININE: 1.49 mg/dL — AB (ref 0.61–1.24)
Calcium: 8.7 mg/dL — ABNORMAL LOW (ref 8.9–10.3)
GFR calc non Af Amer: 52 mL/min — ABNORMAL LOW (ref >60)
Glucose, Bld: 116 mg/dL — ABNORMAL HIGH (ref 65–99)
Potassium: 3.9 mmol/L (ref 3.5–5.1)
SODIUM: 138 mmol/L (ref 135–145)

## 2015-10-04 LAB — I-STAT TROPONIN, ED: TROPONIN I, POC: 0.02 ng/mL (ref 0.00–0.08)

## 2015-10-04 MED ORDER — MORPHINE SULFATE (PF) 4 MG/ML IV SOLN
4.0000 mg | Freq: Once | INTRAVENOUS | Status: AC
  Administered 2015-10-05: 4 mg via INTRAVENOUS
  Filled 2015-10-04 (×2): qty 1

## 2015-10-04 MED ORDER — MORPHINE SULFATE (PF) 4 MG/ML IV SOLN
4.0000 mg | Freq: Once | INTRAVENOUS | Status: AC
  Administered 2015-10-04: 4 mg via INTRAVENOUS
  Filled 2015-10-04: qty 1

## 2015-10-04 NOTE — ED Notes (Addendum)
Pt to ED via [facility] c/o R sided chest pain. Pt took 2 nitro at home and 324 asa, ems gave an additional two nitro with mild relieve. Pain started today while pt was driving and radiates into back "like my other heart attacks." AICD noted to L chest, hx of multiple MIs and AAA. Dr.Bensimhon is pt's cardiologist.

## 2015-10-04 NOTE — ED Provider Notes (Signed)
CSN: [id number]     Arrival date & time 10/04/15  2246 History  By signing my name below, I, Meriel Pica, attest that this documentation has been prepared under the direction and in the presence of Merryl Hacker, MD. Electronically Signed: Meriel Pica, ED Scribe. 10/04/2015. 11:45 PM.   Chief Complaint  Patient presents with  . Chest Pain    The history is provided by the patient and the EMS personnel. No language interpreter was used.   HPI Comments: Chad [name] is a 54 y.o. male, with a PMhx of CAD, HTN, CHF, AAA, carotid stenosis, and MIs,, brought in by ambulance, who presents to the Emergency Department complaining of sudden onset, waxing and waning upper abdominal pain described as a pressure that radiates to back onset today after eating. He describes his pain now as a 3/10 discomfort. He reports the abdominal pain to be typical of pain associated with his gallbladder but also reports the radiation of the pain to his back as typical of his past MI symptoms. He has a PShx of CABG with AICD in left chest. Pt denies nausea, vomiting, or diaphoresis. No PShx of cholecystectomy.  Pt was given a total of 4 NTG and 324mg  ASA by EMS. Some relief of pain with morphine administered in ED. Pt followed by cardiologist Dr. Haroldine Laws.   Past Medical History  Diagnosis Date  . CAD (coronary artery disease)     a. s/p CABG 2006;  b. DES to PDA 2011 (cath: Dx not seen, dRCA/PDA tx with DES; S-PDA occluded ([place]), S-Dx occluded, S-RI and OM ok, L-LAD ok  . HTN (hypertension)     severe  . Chronic systolic heart failure (HCC)     a. 12/13 ECHO: EF 35-40%, sept, apical & posterobasal HK, LV mod dil & sys fx mod reduced, mild AI, MV mild reg, TV mild reg  . Aortic dissection, thoracoabdominal ([place])     7/10: Type I s/p repair  . CRI (chronic renal insufficiency)   . 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 HLD (hyperlipidemia)   . Anemia   . Gout   . AICD (automatic  cardioverter/defibrillator) present   . Carotid stenosis     dopplers 2011: 0-39% bilat.  . Chest pain syndrome    Past Surgical History  Procedure Laterality Date  . Coronary artery bypass graft  06/21/2009    x2  . Cardiac defibrillator placement      [employer]  . Thyroidectomy, partial  06/20/11  . Lumbar disc surgery      x 2, most recent within 5-10 years  . Status post emergency repair of a type a ascending aortic dissection with a hemiarch reconstruction of the ascending aorta  using a 28-mm hemashield graft with redo sternotomy and revision of previous bypass grafts in june 2010.    . Median sternotomy,cabg x 7  06/29/2005  . Implantable cardioverter defibrillator (icd) generator change N/A 12/13/2012    Procedure: ICD GENERATOR CHANGE;  Surgeon: Evans Lance, MD;  Location: [facility] CATH LAB;  Service: Cardiovascular;  Laterality: N/A;   Family History  Problem Relation Age of Onset  . Coronary artery disease    . Hypertension Father   . Heart disease Father   . Early death Father   . COPD Father   . Hypertension Mother    Social History  Substance Use Topics  . Smoking status: Never Smoker   . Smokeless tobacco: Never Used  . Alcohol  Use: No    Review of Systems  Constitutional: Negative.  Negative for fever and diaphoresis.  Respiratory: Negative.  Negative for chest tightness and shortness of breath.   Cardiovascular: Positive for chest pain.  Gastrointestinal: Positive for abdominal pain. Negative for nausea, vomiting and diarrhea.  Genitourinary: Negative.  Negative for dysuria.  Musculoskeletal: Positive for back pain.  Skin: Negative for rash.  All other systems reviewed and are negative.  Allergies  Iohexol; Lipitor; Shellfish allergy; Sulfonamide derivatives; Latex; and Zocor  Home Medications   Prior to Admission medications   Medication Sig Start Date End Date Taking? Authorizing Provider  amLODipine (NORVASC) 5 MG tablet Take 1 tablet (5 mg  total) by mouth 2 (two) times daily. 09/28/15  Yes Jolaine Artist, MD  aspirin EC 81 MG tablet Take 1 tablet (81 mg total) by mouth daily. 04/12/15  Yes Larey Dresser, MD  b complex vitamins tablet Take 2 tablets by mouth daily.    Yes Historical Provider, MD  carvedilol (COREG) 25 MG tablet Take 1 tablet (25 mg total) by mouth 2 (two) times daily with a meal. 09/22/15  Yes Brett Canales, PA-C  clopidogrel (PLAVIX) 75 MG tablet Take 1 tablet (75 mg total) by mouth at bedtime. 09/28/15  Yes Jolaine Artist, MD  cyclobenzaprine (FLEXERIL) 10 MG tablet Take 10 mg by mouth daily as needed for muscle spasms.    Yes Historical Provider, MD  diphenhydrAMINE (BENADRYL) 25 MG tablet Take 12.5 mg by mouth 2 (two) times daily as needed for itching or allergies.   Yes Historical Provider, MD  ezetimibe (ZETIA) 10 MG tablet Take 1 tablet (10 mg total) by mouth daily. 09/28/15  Yes Jolaine Artist, MD  furosemide (LASIX) 40 MG tablet Take 1 tablet (40 mg total) by mouth every other day. Take daily for 3 days then take one tablet  every other day Patient taking differently: Take 40 mg by mouth every other day.  09/28/15  Yes Jolaine Artist, MD  hydrALAZINE (APRESOLINE) 50 MG tablet Take 1.5 tablets (75 mg total) by mouth 3 (three) times daily. 09/28/15  Yes Jolaine Artist, MD  isosorbide mononitrate (IMDUR) 60 MG 24 hr tablet Take 1 tablet (60 mg total) by mouth 2 (two) times daily. 09/28/15  Yes Jolaine Artist, MD  loratadine (CLARITIN) 10 MG tablet Take 1 tablet (10 mg total) by mouth daily as needed for allergies. 03/26/15  Yes Amy D Clegg, NP  Multiple Vitamin (MULTIVITAMIN WITH MINERALS) TABS tablet Take 1 tablet by mouth daily. Mega Men 50 Plus   Yes Historical Provider, MD  nitroGLYCERIN (NITROSTAT) 0.4 MG SL tablet Place 1 tablet (0.4 mg total) under the tongue every 5 (five) minutes x 3 doses as needed for chest pain. 09/28/15  Yes Jolaine Artist, MD  Oxycodone HCl 10 MG TABS Take 10 mg by  mouth 4 (four) times daily.   Yes Historical Provider, MD  potassium chloride (K-DUR) 10 MEQ tablet Take 1 tablet (10 mEq total) by mouth daily. 09/28/15  Yes Jolaine Artist, MD  pregabalin (LYRICA) 75 MG capsule Take 75 mg by mouth 2 (two) times daily.   Yes Historical Provider, MD  HYDROcodone-acetaminophen (NORCO/VICODIN) 5-325 MG tablet Take 1-2 tablets by mouth every 6 (six) hours as needed. 10/05/15   Merryl Hacker, MD   BP 126/74 mmHg  Pulse 75  Temp(Src) 97.5 F (36.4 C) (Oral)  Resp 16  SpO2 100% Physical Exam  Constitutional: He is  oriented to person, place, and time. He appears well-developed and well-nourished. No distress.  HENT:  Head: Normocephalic and atraumatic.  Cardiovascular: Normal rate, regular rhythm and normal heart sounds.   No murmur heard. Pulmonary/Chest: Effort normal and breath sounds normal. No respiratory distress. He has no wheezes. He exhibits no tenderness.  Midline sternotomy scar, ICD palpated left upper chest  Abdominal: Soft. Bowel sounds are normal. There is no tenderness. There is no rebound.  Musculoskeletal: He exhibits no edema.  Neurological: He is alert and oriented to person, place, and time.  Skin: Skin is warm and dry.  Psychiatric: He has a normal mood and affect.  Nursing note and vitals reviewed.   ED Course  Procedures  DIAGNOSTIC STUDIES: Oxygen Saturation is 100% on RA, normal by my interpretation.    COORDINATION OF CARE: 11:22 PM Discussed treatment plan with pt at bedside and pt agreed to plan. Cardiac workup ordered.  1:50 AM Repeat troponin ordered. Discussed plan with pt and pt agreed to plan.   Labs Review Labs Reviewed  BASIC METABOLIC PANEL - Abnormal; Notable for the following:    Glucose, Bld 116 (*)    Creatinine, Ser 1.49 (*)    Calcium 8.7 (*)    GFR calc non Af Amer 52 (*)    All other components within normal limits  CBC - Abnormal; Notable for the following:    WBC 3.4 (*)    Platelets 141 (*)     All other components within normal limits  HEPATIC FUNCTION PANEL - Abnormal; Notable for the following:    ALT 15 (*)    All other components within normal limits  LIPASE, BLOOD  I-STAT TROPOININ, ED  I-STAT TROPOININ, ED    Imaging Review Dg Chest Portable 1 View  10/04/2015   CLINICAL DATA:  54 year old male with mid chest pain tonight. Initial encounter.  EXAM: PORTABLE CHEST 1 VIEW  COMPARISON:  03/06/2015 and earlier.  FINDINGS: Portable AP semi upright view at 2321 hours. Mildly lower lung volumes. Stable cardiomegaly and mediastinal contours. Left chest cardiac AICD and sequelae of CABG. Tortuous thoracic aorta contours stable. No pneumothorax, pulmonary edema, pleural effusion or confluent pulmonary opacity. Stable surgical clips at the left thoracic inlet.  IMPRESSION: Stable cardiomegaly. No acute cardiopulmonary abnormality.   Electronically Signed   By: Genevie Ann M.D.   On: 10/04/2015 23:24   US Abdomen Limited Ruq  10/05/2015   CLINICAL DATA:  Chest pain for 1 day.  EXAM: US ABDOMEN LIMITED - RIGHT UPPER QUADRANT  COMPARISON:  CT abdomen and pelvis 03/21/2015  FINDINGS: Gallbladder:  Small layering stones and sludge in the gallbladder. Gallbladder wall is upper limits of normal at 3 mm. Murphy's sign is positive.  Common bile duct:  Diameter: 4 mm, normal  Liver:  No focal lesion identified. Within normal limits in parenchymal echogenicity.  IMPRESSION: Cholelithiasis with small stones and sludge in the gallbladder. Mild gallbladder wall thickening. Positive Murphy's sign. Findings are consistent with acute cholecystitis in the appropriate clinical setting. No bile duct dilatation.   Electronically Signed   By: Lucienne Capers M.D.   On: 10/05/2015 01:06   I have personally reviewed and evaluated these images and lab results as part of my medical decision-making.   EKG Interpretation   Date/Time:  Monday October 04 2015 22:33:07 EDT Ventricular Rate:  80 PR Interval:   182 QRS Duration: 100 QT Interval:  394 QTC Calculation: 454 R Axis:   47 Text Interpretation:  Normal  sinus rhythm Biatrial enlargement ST \\T \ T  wave abnormality, consider inferolateral ischemia Abnormal ECG T wave  inversions present inferiorly on prior EKG Confirmed by Reginae Wolfrey  MD,  Lisvet Rasheed ([id number]) on 10/04/2015 11:05:15 PM      MDM   Final diagnoses:  Epigastric pain  Biliary colic    Patient presented initially with chest pain. On my evaluation, pain is more epigastric. Has an extensive cardiac history also has a history of biliary colic. EKG is reassuring. Patient is tender on exam but no signs of peritonitis. Lab work is reassuring. No leukocytosis. LFTs are normal. Ultrasound of the gallbladder shows cholelithiasis with mild gallbladder wall thickening and positive Murphy sign. This could be consistent with cholecystitis. Patient reports improvement. Discussed findings with Dr. Grandville Silos. Given that the patient has had improvement of his pain, favor outpatient follow-up. Patient is not a good surgical candidate. Serial troponins also negative and doubt cardiac etiology at this time.  After history, exam, and medical workup I feel the patient has been appropriately medically screened and is safe for discharge home. Pertinent diagnoses were discussed with the patient. Patient was given return precautions.  I personally performed the services described in this documentation, which was scribed in my presence. The recorded information has been reviewed and is accurate.    Merryl Hacker, MD 10/05/15 [phone]

## 2015-10-05 ENCOUNTER — Emergency Department (HOSPITAL_COMMUNITY): Payer: Medicaid Other

## 2015-10-05 LAB — HEPATIC FUNCTION PANEL
ALBUMIN: 4 g/dL (ref 3.5–5.0)
ALK PHOS: 48 U/L (ref 38–126)
ALT: 15 U/L — ABNORMAL LOW (ref 17–63)
AST: 21 U/L (ref 15–41)
BILIRUBIN TOTAL: 0.5 mg/dL (ref 0.3–1.2)
Bilirubin, Direct: 0.1 mg/dL (ref 0.1–0.5)
Indirect Bilirubin: 0.4 mg/dL (ref 0.3–0.9)
Total Protein: 7.2 g/dL (ref 6.5–8.1)

## 2015-10-05 LAB — LIPASE, BLOOD: Lipase: 26 U/L (ref 22–51)

## 2015-10-05 LAB — I-STAT TROPONIN, ED: Troponin i, poc: 0.02 ng/mL (ref 0.00–0.08)

## 2015-10-05 MED ORDER — HYDROCODONE-ACETAMINOPHEN 5-325 MG PO TABS: 1.0000 | ORAL_TABLET | Freq: Four times a day (QID) | ORAL | Status: DC | PRN

## 2015-10-05 NOTE — ED Notes (Signed)
Pt given Sprite and sandwich; reports slight increase in pain in abdomen after sitting up in bed

## 2015-10-05 NOTE — Discharge Instructions (Signed)
You were seen today. He has sludge in her gallbladder and likely have mild inflammation of her gallbladder. Because your pain improved, there is no indication for emergent surgery at this time especially given your cardiac history. You need to follow-up with surgery as an outpatient. See return precautions below.  Biliary Colic Biliary colic is a pain in the upper abdomen. The pain:  Is usually felt on the right side of the abdomen, but it may also be felt in the center of the abdomen, just below the breastbone (sternum).  May spread back toward the right shoulder blade.  May be steady or irregular.  May be accompanied by nausea and vomiting. Most of the time, the pain goes away in 1-5 hours. After the most intense pain passes, the abdomen may continue to ache mildly for about 24 hours. Biliary colic is caused by a blockage in the bile duct. The bile duct is a pathway that carries bile--a liquid that helps to digest fats--from the gallbladder to the small intestine. Biliary colic usually occurs after eating, when the digestive system demands bile. The pain develops when muscle cells contract forcefully to try to move the blockage so that bile can get by. HOME CARE INSTRUCTIONS  Take medicines only as directed by your health care provider.  Drink enough fluid to keep your urine clear or pale yellow.  Avoid fatty, greasy, and fried foods. These kinds of foods increase your body's demand for bile.  Avoid any foods that make your pain worse.  Avoid overeating.  Avoid having a large meal after fasting. SEEK MEDICAL CARE IF:  You develop a fever.  Your pain gets worse.  You vomit.  You develop nausea that prevents you from eating and drinking. SEEK IMMEDIATE MEDICAL CARE IF:  You suddenly develop a fever and shaking chills.  You develop a yellowish discoloration (jaundice) of:  Skin.  Whites of the eyes.  Mucous membranes.  You have continuous or severe pain that is not  relieved with medicines.  You have nausea and vomiting that is not relieved with medicines.  You develop dizziness or you faint.   This information is not intended to replace advice given to you by your health care provider. Make sure you discuss any questions you have with your health care provider.   Document Released: 05/14/2006 Document Revised: 04/27/2015 Document Reviewed: 09/22/2014 Elsevier Interactive Patient Education [employer].

## 2015-10-07 ENCOUNTER — Encounter (HOSPITAL_COMMUNITY): Payer: Self-pay | Admitting: Emergency Medicine

## 2015-10-07 ENCOUNTER — Other Ambulatory Visit (HOSPITAL_COMMUNITY): Payer: Medicaid Other

## 2015-10-07 ENCOUNTER — Emergency Department (HOSPITAL_COMMUNITY)
Admission: EM | Admit: 2015-10-07 | Discharge: 2015-10-08 | Disposition: A | Discharge status: Home / Self Care | Payer: Medicaid Other | Attending: Emergency Medicine | Admitting: Emergency Medicine

## 2015-10-07 DIAGNOSIS — Z85850 Personal history of malignant neoplasm of thyroid: Secondary | ICD-10-CM | POA: Diagnosis not present

## 2015-10-07 DIAGNOSIS — K8020 Calculus of gallbladder without cholecystitis without obstruction: Secondary | ICD-10-CM | POA: Diagnosis not present

## 2015-10-07 DIAGNOSIS — Z862 Personal history of diseases of the blood and blood-forming organs and certain disorders involving the immune mechanism: Secondary | ICD-10-CM | POA: Diagnosis not present

## 2015-10-07 DIAGNOSIS — I2510 Atherosclerotic heart disease of native coronary artery without angina pectoris: Secondary | ICD-10-CM | POA: Diagnosis not present

## 2015-10-07 DIAGNOSIS — Z7982 Long term (current) use of aspirin: Secondary | ICD-10-CM | POA: Insufficient documentation

## 2015-10-07 DIAGNOSIS — K8050 Calculus of bile duct without cholangitis or cholecystitis without obstruction: Secondary | ICD-10-CM

## 2015-10-07 DIAGNOSIS — R109 Unspecified abdominal pain: Secondary | ICD-10-CM | POA: Diagnosis present

## 2015-10-07 DIAGNOSIS — I5022 Chronic systolic (congestive) heart failure: Secondary | ICD-10-CM | POA: Diagnosis not present

## 2015-10-07 DIAGNOSIS — I129 Hypertensive chronic kidney disease with stage 1 through stage 4 chronic kidney disease, or unspecified chronic kidney disease: Secondary | ICD-10-CM | POA: Diagnosis not present

## 2015-10-07 DIAGNOSIS — Z79899 Other long term (current) drug therapy: Secondary | ICD-10-CM | POA: Diagnosis not present

## 2015-10-07 DIAGNOSIS — Z951 Presence of aortocoronary bypass graft: Secondary | ICD-10-CM | POA: Diagnosis not present

## 2015-10-07 DIAGNOSIS — N189 Chronic kidney disease, unspecified: Secondary | ICD-10-CM | POA: Insufficient documentation

## 2015-10-07 DIAGNOSIS — Z8639 Personal history of other endocrine, nutritional and metabolic disease: Secondary | ICD-10-CM | POA: Diagnosis not present

## 2015-10-07 NOTE — ED Notes (Signed)
Patient given ice chips per Dr. Sabra Heck.

## 2015-10-07 NOTE — ED Provider Notes (Signed)
CSN: [id number]     Arrival date & time 10/07/15  2303 History   First MD Initiated Contact with Patient 10/07/15 2310     Chief Complaint  Patient presents with  . Abdominal Pain     (Consider location/radiation/quality/duration/timing/severity/associated sxs/prior Treatment) HPI Comments: The pt has a medical history complicated by congestive heart failure, coronary disease status post bypass grafting, stenting and then aortic dissection which was thoracoabdominal in location. He reports that several days ago he had some upper abdominal pain in the right upper quadrant after eating which was severe and lasted for many hours. He had labs which were normal, ultrasound which showed some signs of cholecystitis, his pain improved, he was discharged home and has been pain-free until this evening when he decided to eat a cinnamon bun which was very large, approximately 30 minutes after that he developed recurrent right upper quadrant pain with nausea. This pain is essentially gone at this point, he has no symptoms at this time, he denies chest pain or shortness of breath. The symptoms did not last very long tonight compared to the other night. Labs and ultrasound reviewed, patient refuses pain medicine at this time  Patient is a 54 y.o. male presenting with abdominal pain. The history is provided by the patient.  Abdominal Pain   Past Medical History  Diagnosis Date  . CAD (coronary artery disease)     a. s/p CABG 2006;  b. DES to PDA 2011 (cath: Dx not seen, dRCA/PDA tx with DES; S-PDA occluded (culprit), S-Dx occluded, S-RI and OM ok, L-LAD ok  . HTN (hypertension)     severe  . Chronic systolic heart failure (HCC)     a. 12/13 ECHO: EF 35-40%, sept, apical & posterobasal HK, LV mod dil & sys fx mod reduced, mild AI, MV mild reg, TV mild reg  . Aortic dissection, thoracoabdominal ([place])     7/10: Type I s/p repair  . CRI (chronic renal insufficiency)   . Thyroid cancer ([place])     Hertle Cell   . HLD (hyperlipidemia)   . Anemia   . Gout   . AICD (automatic cardioverter/defibrillator) present   . Carotid stenosis     dopplers 2011: 0-39% bilat.  . Chest pain syndrome    Past Surgical History  Procedure Laterality Date  . Coronary artery bypass graft  06/21/2009    x2  . Cardiac defibrillator placement      [employer]  . Thyroidectomy, partial  06/20/11  . Lumbar disc surgery      x 2, most recent within 5-10 years  . Status post emergency repair of a type a ascending aortic dissection with a hemiarch reconstruction of the ascending aorta  using a 28-mm hemashield graft with redo sternotomy and revision of previous bypass grafts in june 2010.    . Median sternotomy,cabg x 7  06/29/2005  . Implantable cardioverter defibrillator (icd) generator change N/A 12/13/2012    Procedure: ICD GENERATOR CHANGE;  Surgeon: Evans Lance, MD;  Location[facility] LAB;  Service: Cardiovascular;  Laterality: N/A;   Family History  Problem Relation Age of Onset  . Coronary artery disease    . Hypertension Father   . Heart disease Father   . Early death Father   . COPD Father   . Hypertension Mother    Social History  Substance Use Topics  . Smoking status: Never Smoker   . Smokeless tobacco: Never Used  . Alcohol Use: No  Review of Systems  Gastrointestinal: Positive for abdominal pain.  All other systems reviewed and are negative.     Allergies  Iohexol; Lipitor; Shellfish allergy; Sulfonamide derivatives; Latex; and Zocor  Home Medications   Prior to Admission medications   Medication Sig Start Date End Date Taking? Authorizing Provider  amLODipine (NORVASC) 5 MG tablet Take 1 tablet (5 mg total) by mouth 2 (two) times daily. 09/28/15   Jolaine Artist, MD  aspirin EC 81 MG tablet Take 1 tablet (81 mg total) by mouth daily. 04/12/15   Larey Dresser, MD  b complex vitamins tablet Take 2 tablets by mouth daily.     Historical Provider, MD  carvedilol (COREG)  25 MG tablet Take 1 tablet (25 mg total) by mouth 2 (two) times daily with a meal. 09/22/15   Brett Canales, PA-C  clopidogrel (PLAVIX) 75 MG tablet Take 1 tablet (75 mg total) by mouth at bedtime. 09/28/15   Jolaine Artist, MD  cyclobenzaprine (FLEXERIL) 10 MG tablet Take 10 mg by mouth daily as needed for muscle spasms.     Historical Provider, MD  diphenhydrAMINE (BENADRYL) 25 MG tablet Take 12.5 mg by mouth 2 (two) times daily as needed for itching or allergies.    Historical Provider, MD  ezetimibe (ZETIA) 10 MG tablet Take 1 tablet (10 mg total) by mouth daily. 09/28/15   Jolaine Artist, MD  furosemide (LASIX) 40 MG tablet Take 1 tablet (40 mg total) by mouth every other day. Take daily for 3 days then take one tablet  every other day Patient taking differently: Take 40 mg by mouth every other day.  09/28/15   Jolaine Artist, MD  hydrALAZINE (APRESOLINE) 50 MG tablet Take 1.5 tablets (75 mg total) by mouth 3 (three) times daily. 09/28/15   Jolaine Artist, MD  HYDROcodone-acetaminophen (NORCO/VICODIN) 5-325 MG tablet Take 1-2 tablets by mouth every 6 (six) hours as needed. 10/05/15   Merryl Hacker, MD  isosorbide mononitrate (IMDUR) 60 MG 24 hr tablet Take 1 tablet (60 mg total) by mouth 2 (two) times daily. 09/28/15   Jolaine Artist, MD  loratadine (CLARITIN) 10 MG tablet Take 1 tablet (10 mg total) by mouth daily as needed for allergies. 03/26/15   Amy D Ninfa Meeker, NP  Multiple Vitamin (MULTIVITAMIN WITH MINERALS) TABS tablet Take 1 tablet by mouth daily. Mega Men 50 Plus    Historical Provider, MD  nitroGLYCERIN (NITROSTAT) 0.4 MG SL tablet Place 1 tablet (0.4 mg total) under the tongue every 5 (five) minutes x 3 doses as needed for chest pain. 09/28/15   Jolaine Artist, MD  Oxycodone HCl 10 MG TABS Take 10 mg by mouth 4 (four) times daily.    Historical Provider, MD  potassium chloride (K-DUR) 10 MEQ tablet Take 1 tablet (10 mEq total) by mouth daily. 09/28/15   Jolaine Artist, MD  pregabalin (LYRICA) 75 MG capsule Take 75 mg by mouth 2 (two) times daily.    Historical Provider, MD   BP 124/78 mmHg  Pulse 71  Temp(Src) 97.9 F (36.6 C) (Oral)  Resp 16  Ht 5\' 9"  (1.753 m)  Wt 180 lb (81.647 kg)  BMI 26.57 kg/m2  SpO2 96% Physical Exam  Constitutional: He appears well-developed and well-nourished. No distress.  HENT:  Head: Normocephalic and atraumatic.  Mouth/Throat: Oropharynx is clear and moist. No oropharyngeal exudate.  Eyes: Conjunctivae and EOM are normal. Pupils are equal, round, and reactive to light. Right  eye exhibits no discharge. Left eye exhibits no discharge. No scleral icterus.  Neck: Normal range of motion. Neck supple. No JVD present. No thyromegaly present.  Cardiovascular: Normal rate, regular rhythm, normal heart sounds and intact distal pulses.  Exam reveals no gallop and no friction rub.   No murmur heard. Pulmonary/Chest: Effort normal and breath sounds normal. No respiratory distress. He has no wheezes. He has no rales.  Abdominal: Soft. Bowel sounds are normal. He exhibits no distension and no mass. There is tenderness ( Scant if any tenderness in the right upper quadrant).  Musculoskeletal: Normal range of motion. He exhibits no edema or tenderness.  Lymphadenopathy:    He has no cervical adenopathy.  Neurological: He is alert. Coordination normal.  Skin: Skin is warm and dry. No rash noted. No erythema.  Psychiatric: He has a normal mood and affect. His behavior is normal.  Nursing note and vitals reviewed.   ED Course  Procedures (including critical care time) Labs Review Labs Reviewed  CBC WITH DIFFERENTIAL/PLATELET - Abnormal; Notable for the following:    Platelets 147 (*)    All other components within normal limits  COMPREHENSIVE METABOLIC PANEL  LIPASE, BLOOD  I-STAT TROPOININ, ED    Imaging Review No results found. I have personally reviewed and evaluated these images and lab results as part of my  medical decision-making.   EKG Interpretation   Date/Time:  Friday October 08 2015 00:01:11 EDT Ventricular Rate:  76 PR Interval:  195 QRS Duration: 104 QT Interval:  415 QTC Calculation: 467 R Axis:   -18 Text Interpretation:  Sinus rhythm LAE, consider biatrial enlargement LVH  with secondary repolarization abnormality Probable inferior infarct, age  indeterminate since last tracing no significant change Confirmed by Kadyn Chovan   MD, Allix Blomquist ([id number]) on 10/08/2015 12:04:45 AM      MDM   Final diagnoses:  Biliary colic    Will order labs, EKG to confirm that this is not a cardiac source, appears to be recurrent biliary colic, the patient has been given general surgical follow-up, he freely admits that he has not pursued this as he thought that he could hold out longer and avoid surgery. He agrees to make an appointment to see the surgeon outpatient. At this time there is no indication for repeat ultrasound imaging as the patient is essentially pain-free  Labs pending at change of shift - care signed out to Ms Charlann Lange who will f/u results and disposition accordingly.  Noemi Chapel, MD 10/08/15 [phone]

## 2015-10-07 NOTE — ED Notes (Signed)
Pt had sudden onset of RUQ pain after eating a large meal. Hx of gall stones pt states pain feels similar. Pts pain was 10/10 on ems arrival pain has since been relieved. Denies and n/v/d.

## 2015-10-08 LAB — COMPREHENSIVE METABOLIC PANEL
ALBUMIN: 4 g/dL (ref 3.5–5.0)
ALT: 31 U/L (ref 17–63)
AST: 66 U/L — AB (ref 15–41)
Alkaline Phosphatase: 57 U/L (ref 38–126)
Anion gap: 10 (ref 5–15)
BILIRUBIN TOTAL: 0.6 mg/dL (ref 0.3–1.2)
BUN: 23 mg/dL — AB (ref 6–20)
CO2: 27 mmol/L (ref 22–32)
Calcium: 8.7 mg/dL — ABNORMAL LOW (ref 8.9–10.3)
Chloride: 103 mmol/L (ref 101–111)
Creatinine, Ser: 1.6 mg/dL — ABNORMAL HIGH (ref 0.61–1.24)
GFR calc Af Amer: 56 mL/min — ABNORMAL LOW (ref >60)
GFR calc non Af Amer: 48 mL/min — ABNORMAL LOW (ref >60)
GLUCOSE: 110 mg/dL — AB (ref 65–99)
POTASSIUM: 3.9 mmol/L (ref 3.5–5.1)
SODIUM: 140 mmol/L (ref 135–145)
TOTAL PROTEIN: 6.6 g/dL (ref 6.5–8.1)

## 2015-10-08 LAB — CBC WITH DIFFERENTIAL/PLATELET
BASOS ABS: 0 10*3/uL (ref 0.0–0.1)
BASOS PCT: 1 %
EOS ABS: 0.2 10*3/uL (ref 0.0–0.7)
Eosinophils Relative: 4 %
HEMATOCRIT: 40.5 % (ref 39.0–52.0)
HEMOGLOBIN: 13.3 g/dL (ref 13.0–17.0)
Lymphocytes Relative: 28 %
Lymphs Abs: 1.1 10*3/uL (ref 0.7–4.0)
MCH: 27.6 pg (ref 26.0–34.0)
MCHC: 32.8 g/dL (ref 30.0–36.0)
MCV: 84 fL (ref 78.0–100.0)
MONO ABS: 0.4 10*3/uL (ref 0.1–1.0)
Monocytes Relative: 10 %
NEUTROS ABS: 2.3 10*3/uL (ref 1.7–7.7)
NEUTROS PCT: 57 %
Platelets: 147 10*3/uL — ABNORMAL LOW (ref 150–400)
RBC: 4.82 MIL/uL (ref 4.22–5.81)
RDW: 14.2 % (ref 11.5–15.5)
WBC: 4 10*3/uL (ref 4.0–10.5)

## 2015-10-08 LAB — I-STAT TROPONIN, ED: Troponin i, poc: 0 ng/mL (ref 0.00–0.08)

## 2015-10-08 LAB — LIPASE, BLOOD: Lipase: 28 U/L (ref 22–51)

## 2015-10-08 NOTE — Discharge Instructions (Signed)

## 2015-10-13 ENCOUNTER — Other Ambulatory Visit (HOSPITAL_COMMUNITY): Payer: Self-pay | Admitting: Adult Health

## 2015-10-14 ENCOUNTER — Ambulatory Visit: Payer: Self-pay | Admitting: General Surgery

## 2015-10-14 ENCOUNTER — Other Ambulatory Visit (HOSPITAL_COMMUNITY): Payer: Medicaid Other

## 2015-10-14 NOTE — H&P (Signed)
History of Present Illness Chad Ok MD; 10/14/2015 11:33 AM) The patient is a 54 year old male who presents for evaluation of gall stones. The patient is a 54 year old male who is referred by [facility] for evaluation of symptomatic cholelithiasis. Patient was recently in the ER secondary to epigastric/right upper quadrant pain. Patient underwent an ultrasound which revealed thickening of the gallbladder wall as well as multiple stones. Patient's laboratory functions within normal limits.  Patient states he noticed pain after eating high fatty meals. This associated with nausea and vomiting.   Allergies (Sonya Bynum, CMA; 10/14/2015 11:18 AM) Iohexol *DIAGNOSTIC PRODUCTS* Lipitor *ANTIHYPERLIPIDEMICS* Shellfish-derived Products Sulfonamide Derivatives Latex Exam Gloves *MEDICAL DEVICES AND SUPPLIES* Zocor *ANTIHYPERLIPIDEMICS*  Medication History (Sonya Bynum, CMA; 10/14/2015 11:18 AM) Norvasc (5MG  Tablet, Oral) Active. B Complex (Oral) Active. Coreg (25MG  Tablet, Oral) Active. Plavix (75MG  Tablet, Oral) Active. Zetia (10MG  Tablet, Oral) Active. Lasix (40MG  Tablet, Oral) Active. Apresoline (50MG  Tablet, Oral) Active. Imdur (60MG  Tablet ER 24HR, Oral) Active. Multiple Vitamin (Oral) Active. Nitrostat (0.4MG  Tab Sublingual, Sublingual) Active. OxyCODONE HCl ER (30MG  Tab 12HR Deter, Oral) Active. K-Dur [facility] Tablet ER, Oral) Active. Medications Reconciled    Review of Systems Chad Ok, MD; 10/14/2015 11:32 AM) General Present- Feeling well. Not Present- Fever. Respiratory Not Present- Cough and Difficulty Breathing. Cardiovascular Not Present- Chest Pain. Gastrointestinal Present- Abdominal Pain and Nausea. Musculoskeletal Not Present- Myalgia. Neurological Not Present- Weakness.  Vitals (Sonya Bynum CMA; 10/14/2015 11:18 AM) 10/14/2015 11:18 AM Weight: 176 lb Height: 71in Body Surface Area: 2 m Body Mass Index: 24.55 kg/m   Temp.: 3F(Temporal)  Pulse: 77 (Regular)  BP: 126/80 (Sitting, Left Arm, Standard)       Physical Exam Chad Ok, MD; 10/14/2015 11:32 AM) General Mental Status-Alert. General Appearance-Consistent with stated age. Hydration-Well hydrated. Voice-Normal.  Head and Neck Head-normocephalic, atraumatic with no lesions or palpable masses.  Eye Eyeball - Bilateral-Extraocular movements intact. Sclera/Conjunctiva - Bilateral-No scleral icterus.  Chest and Lung Exam Chest and lung exam reveals -quiet, even and easy respiratory effort with no use of accessory muscles. Inspection Chest Wall - Normal. Back - normal.  Cardiovascular Cardiovascular examination reveals -normal heart sounds, regular rate and rhythm with no murmurs.  Abdomen Inspection Normal Exam - No Hernias. Palpation/Percussion Normal exam - Soft, Non Tender, No Rebound tenderness, No Rigidity (guarding) and No hepatosplenomegaly. Auscultation Normal exam - Bowel sounds normal.  Neurologic Neurologic evaluation reveals -alert and oriented x 3 with no impairment of recent or remote memory. Mental Status-Normal.  Musculoskeletal Normal Exam - Left-Upper Extremity Strength Normal and Lower Extremity Strength Normal. Normal Exam - Right-Upper Extremity Strength Normal, Lower Extremity Weakness.    Assessment & Plan Chad Ok MD; 10/14/2015 11:34 AM) GALL STONES (K80.20) Impression: 54 year old male with symptomatic cholelithiasis.  1. The patient will like to proceed to the operative for laparoscopic cholecystectomy. 2. Risks and benefits were discussed with the patient to generally include, but not limited to: infection, bleeding, possible need for post op ERCP, damage to the bile ducts, bile leak, and possible need for further surgery. Alternatives were offered and described. All questions were answered and the patient voiced understanding of the procedure and  wishes to proceed at this point with a laparoscopic cholecystectomy

## 2015-10-20 ENCOUNTER — Other Ambulatory Visit: Payer: Self-pay | Admitting: *Deleted

## 2015-10-20 DIAGNOSIS — I712 Thoracic aortic aneurysm, without rupture: Secondary | ICD-10-CM

## 2015-10-20 DIAGNOSIS — I7123 Aneurysm of the descending thoracic aorta, without rupture: Secondary | ICD-10-CM

## 2015-10-22 ENCOUNTER — Ambulatory Visit (INDEPENDENT_AMBULATORY_CARE_PROVIDER_SITE_OTHER): Payer: Medicaid Other | Admitting: Internal Medicine

## 2015-10-22 ENCOUNTER — Other Ambulatory Visit: Payer: Self-pay

## 2015-10-22 ENCOUNTER — Ambulatory Visit (HOSPITAL_COMMUNITY): Payer: Medicaid Other | Attending: Internal Medicine

## 2015-10-22 ENCOUNTER — Encounter: Payer: Self-pay | Admitting: Internal Medicine

## 2015-10-22 VITALS — BP 113/73 | HR 64 | Temp 97.5°F | Ht 71.0 in | Wt 176.9 lb

## 2015-10-22 DIAGNOSIS — I071 Rheumatic tricuspid insufficiency: Secondary | ICD-10-CM | POA: Insufficient documentation

## 2015-10-22 DIAGNOSIS — R29898 Other symptoms and signs involving the musculoskeletal system: Secondary | ICD-10-CM | POA: Insufficient documentation

## 2015-10-22 DIAGNOSIS — E785 Hyperlipidemia, unspecified: Secondary | ICD-10-CM | POA: Insufficient documentation

## 2015-10-22 DIAGNOSIS — Z23 Encounter for immunization: Secondary | ICD-10-CM | POA: Diagnosis present

## 2015-10-22 DIAGNOSIS — I77810 Thoracic aortic ectasia: Secondary | ICD-10-CM | POA: Diagnosis not present

## 2015-10-22 DIAGNOSIS — I5022 Chronic systolic (congestive) heart failure: Secondary | ICD-10-CM

## 2015-10-22 DIAGNOSIS — R079 Chest pain, unspecified: Secondary | ICD-10-CM

## 2015-10-22 DIAGNOSIS — I5189 Other ill-defined heart diseases: Secondary | ICD-10-CM | POA: Insufficient documentation

## 2015-10-22 DIAGNOSIS — I340 Nonrheumatic mitral (valve) insufficiency: Secondary | ICD-10-CM | POA: Insufficient documentation

## 2015-10-22 DIAGNOSIS — L739 Follicular disorder, unspecified: Secondary | ICD-10-CM

## 2015-10-22 DIAGNOSIS — I10 Essential (primary) hypertension: Secondary | ICD-10-CM | POA: Insufficient documentation

## 2015-10-22 DIAGNOSIS — Z951 Presence of aortocoronary bypass graft: Secondary | ICD-10-CM | POA: Insufficient documentation

## 2015-10-22 DIAGNOSIS — I351 Nonrheumatic aortic (valve) insufficiency: Secondary | ICD-10-CM | POA: Insufficient documentation

## 2015-10-22 DIAGNOSIS — I517 Cardiomegaly: Secondary | ICD-10-CM | POA: Insufficient documentation

## 2015-10-22 DIAGNOSIS — L738 Other specified follicular disorders: Secondary | ICD-10-CM

## 2015-10-22 DIAGNOSIS — L729 Follicular cyst of the skin and subcutaneous tissue, unspecified: Secondary | ICD-10-CM

## 2015-10-22 MED ORDER — MUPIROCIN CALCIUM 2 % NA OINT: TOPICAL_OINTMENT | NASAL | Status: DC

## 2015-10-22 NOTE — Assessment & Plan Note (Signed)
-   Bactroban ointment BID for 10 days  - if not resolved consider biopsy and/or ENT referral

## 2015-10-22 NOTE — Patient Instructions (Signed)
Thank you for coming in today. Please use the ointment in the affected area twice a day for 10 days. If it does not resolve please return to clinic.

## 2015-10-22 NOTE — Progress Notes (Signed)
Patient ID: Chad [name], male   DOB: 1961-12-09, 54 y.o.   MRN: [id number] Subjective:   CC: bump in right nostril   HPI:   Bump in R nostril: - notes of bump for the past 1.47months - area is sore to touch, which has improved slightly - unsure if bump is increasing in size - no associated symptoms such as rhinorrhea, fevers, chills, nasal congestion, discharge, or bleeding - has not tried any OTC medications at home - no history of smoking, snuff, or other drug use - no past history of similar symptoms - no injury or inciting event per patient   Review of Systems - Per HPI.  PMH reviewed Smoking status:    Objective:  Physical Exam BP 113/73 mmHg  Pulse 64  Temp(Src) 97.5 F (36.4 C) (Oral)  Ht 5\' 11"  (1.803 m)  Wt 176 lb 14.4 oz (80.241 kg)  BMI 24.68 kg/m2 Gen: no acute distress, nontoxic appearing HEENT: Atraumatic, normocephalic, neck supple, EOMI, sclera clear. Right nostril: pimple like lesion on the anterior septum of the right nostril. No significant erythema but does have some dried blood. Nasal turbinates normal. Left nostril: normal. No sinus tenderness; no lymphadenopathy.  CV: RRR, no murmurs, rubs, or gallops PULM: CTAB, normal effort SKIN: No rash or cyanosis; warm and well-perfused    Assessment:     Chad [name] is a 54 y.o. male with nasal folliculitis in the Right Nostril.     Plan:     # See problem list and after visit summary for problem-specific plans.  # Health Maintenance: flu shot today   Follow-up: Follow up in symptoms do not resolve  Smiley Houseman, MD [place]

## 2015-11-01 ENCOUNTER — Encounter: Payer: Medicaid Other | Admitting: Internal Medicine

## 2015-11-01 DIAGNOSIS — R0989 Other specified symptoms and signs involving the circulatory and respiratory systems: Secondary | ICD-10-CM

## 2015-11-01 NOTE — Progress Notes (Deleted)
HPI Chad [name] returns today for followup. He is a 54 year old man with an ischemic cardiomyopathy, status post bypass surgery, with chronic systolic heart failure, ejection fraction 25%. The patient is status post ICD implantation.  He denies chest pain or syncope. He has class II heart failure symptoms. He remains active and denies chest pain, sob, or syncope. No edema. No ICD shock. Allergies  Allergen Reactions  . Iohexol Anaphylaxis and Other (See Comments)     Desc: PT HAS ANAPHYLAXIS WITH CONTRAST MEDIA!  . Lipitor [Atorvastatin Calcium] Anaphylaxis and Other (See Comments)    Large doses  . Shellfish Allergy Anaphylaxis  . Sulfonamide Derivatives Shortness Of Breath  . Latex Rash    With long periods of exposure  . Zocor [Simvastatin] Other (See Comments)    Muscle cramps     Current Outpatient Prescriptions  Medication Sig Dispense Refill  . amLODipine (NORVASC) 5 MG tablet Take 1 tablet (5 mg total) by mouth 2 (two) times daily. 60 tablet 6  . aspirin EC 81 MG tablet Take 1 tablet (81 mg total) by mouth daily. 90 tablet 3  . b complex vitamins tablet Take 2 tablets by mouth daily.     . carvedilol (COREG) 25 MG tablet Take 1 tablet (25 mg total) by mouth 2 (two) times daily with a meal. 60 tablet 6  . clopidogrel (PLAVIX) 75 MG tablet Take 1 tablet (75 mg total) by mouth at bedtime. 30 tablet 6  . clopidogrel (PLAVIX) 75 MG tablet TAKE ONE TABLET BY MOUTH DAILY 30 tablet 3  . cyclobenzaprine (FLEXERIL) 10 MG tablet Take 10 mg by mouth daily as needed for muscle spasms.     . diphenhydrAMINE (BENADRYL) 25 MG tablet Take 12.5 mg by mouth 2 (two) times daily as needed for itching or allergies.    [name] ezetimibe (ZETIA) 10 MG tablet Take 1 tablet (10 mg total) by mouth daily. 30 tablet 6  . furosemide (LASIX) 40 MG tablet Take 1 tablet (40 mg total) by mouth every other day. Take daily for 3 days then take one tablet  every other day (Patient taking differently: Take 40 mg by mouth  every other day. ) 25 tablet 6  . hydrALAZINE (APRESOLINE) 50 MG tablet Take 1.5 tablets (75 mg total) by mouth 3 (three) times daily. 135 tablet 6  . HYDROcodone-acetaminophen (NORCO/VICODIN) 5-325 MG tablet Take 1-2 tablets by mouth every 6 (six) hours as needed. (Patient not taking: Reported on 10/22/2015) 15 tablet 0  . isosorbide mononitrate (IMDUR) 60 MG 24 hr tablet Take 1 tablet (60 mg total) by mouth 2 (two) times daily. 60 tablet 3  . loratadine (CLARITIN) 10 MG tablet Take 1 tablet (10 mg total) by mouth daily as needed for allergies. (Patient not taking: Reported on 10/22/2015) 30 tablet 11  . Multiple Vitamin (MULTIVITAMIN WITH MINERALS) TABS tablet Take 1 tablet by mouth daily. Mega Men 50 Plus    . mupirocin nasal ointment (BACTROBAN NASAL) 2 % Use small amount of ointment in right nostril (in affected area) twice a day for 10 days 10 g 0  . nitroGLYCERIN (NITROSTAT) 0.4 MG SL tablet Place 1 tablet (0.4 mg total) under the tongue every 5 (five) minutes x 3 doses as needed for chest pain. (Patient not taking: Reported on 10/22/2015) 30 tablet 12  . Oxycodone HCl 10 MG TABS Take 10 mg by mouth 4 (four) times daily.    . potassium chloride (K-DUR) 10 MEQ tablet Take 1 tablet (10 mEq  total) by mouth daily. 30 tablet 6  . pregabalin (LYRICA) 75 MG capsule Take 75 mg by mouth 2 (two) times daily.     No current facility-administered medications for this visit.     Past Medical History  Diagnosis Date  . CAD (coronary artery disease)     a. s/p CABG 2006;  b. DES to PDA 2011 (cath: Dx not seen, dRCA/PDA tx with DES; S-PDA occluded (culprit), S-Dx occluded, S-RI and OM ok, L-LAD ok  . HTN (hypertension)     severe  . Chronic systolic heart failure (HCC)     a. 12/13 ECHO: EF 35-40%, sept, apical & posterobasal HK, LV mod dil & sys fx mod reduced, mild AI, MV mild reg, TV mild reg  . Aortic dissection, thoracoabdominal ([place])     7/10: Type I s/p repair  . CRI (chronic renal  insufficiency)   . Thyroid cancer ([place])     Hertle Cell  . HLD (hyperlipidemia)   . Anemia   . Gout   . AICD (automatic cardioverter/defibrillator) present   . Carotid stenosis     dopplers 2011: 0-39% bilat.  . Chest pain syndrome     ROS:   All systems reviewed and negative except as noted in the HPI.   Past Surgical History  Procedure Laterality Date  . Coronary artery bypass graft  06/21/2009    x2  . Cardiac defibrillator placement      [employer]  . Thyroidectomy, partial  06/20/11  . Lumbar disc surgery      x 2, most recent within 5-10 years  . Status post emergency repair of a type a ascending aortic dissection with a hemiarch reconstruction of the ascending aorta  using a 28-mm hemashield graft with redo sternotomy and revision of previous bypass grafts in june 2010.    . Median sternotomy,cabg x 7  06/29/2005  . Implantable cardioverter defibrillator (icd) generator change N/A 12/13/2012    Procedure: ICD GENERATOR CHANGE;  Surgeon: Evans Lance, MD;  Location[facility] LAB;  Service: Cardiovascular;  Laterality: N/A;     Family History  Problem Relation Age of Onset  . Coronary artery disease    . Hypertension Father   . Heart disease Father   . Early death Father   . COPD Father   . Hypertension Mother      Social History   Social History  . Marital Status: Divorced    Spouse Name: N/A  . Number of Children: N/A  . Years of Education: N/A   Occupational History  . disabled    Social History Main Topics  . Smoking status: Never Smoker   . Smokeless tobacco: Never Used  . Alcohol Use: No  . Drug Use: No  . Sexual Activity: Yes   Other Topics Concern  . Not on file   Social History Narrative   Engaged and lives with fiancee and 4 kids.    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      There were no vitals taken for this visit.  Physical Exam:  Chronically ill appearing middle-aged man, NAD HEENT: Unremarkable Neck:  7  cm JVD, no thyromegally Lungs:  Clear except for rales in the bases bilaterally. No wheezes or rhonchi. No increased work of breathing. HEART:  Regular rate rhythm, an intermittant MR murmur appears present with premature beats, no rubs, no clicks, Abd:  soft, positive bowel sounds, no organomegally, no rebound, no guarding Ext:  2 plus pulses,  no edema, no cyanosis, no clubbing Skin:  No rashes no nodules Neuro:  CN II through XII intact, motor grossly intact  DEVICE  Normal device function.  See PaceArt for details.  Assess/Plan:

## 2015-11-02 ENCOUNTER — Telehealth (HOSPITAL_COMMUNITY): Payer: Self-pay | Admitting: *Deleted

## 2015-11-02 NOTE — Telephone Encounter (Signed)
Received note from [facility], pt needs clearance to have gallbladder surgery and to hold asa and plavix 7 days prior.  Per Dr Haroldine Laws:  "Patient is at moderate risk for peri-op cardiac complication.  Can proceed.  Ok to hold meds.  Note faxed to [facility]

## 2015-11-03 ENCOUNTER — Encounter: Payer: Self-pay | Admitting: Internal Medicine

## 2015-11-06 ENCOUNTER — Other Ambulatory Visit (HOSPITAL_COMMUNITY): Payer: Self-pay | Admitting: Internal Medicine

## 2015-11-06 DIAGNOSIS — I5022 Chronic systolic (congestive) heart failure: Secondary | ICD-10-CM

## 2015-11-16 ENCOUNTER — Ambulatory Visit: Payer: Medicaid Other | Admitting: Family Medicine

## 2015-11-22 ENCOUNTER — Other Ambulatory Visit: Payer: Self-pay | Admitting: *Deleted

## 2015-11-22 DIAGNOSIS — Z91041 Radiographic dye allergy status: Secondary | ICD-10-CM

## 2015-11-22 MED ORDER — DIPHENHYDRAMINE HCL 25 MG PO CAPS: 50.0000 mg | ORAL_CAPSULE | Freq: Once | ORAL | Status: DC

## 2015-11-22 MED ORDER — PREDNISONE 10 MG (21) PO TBPK: 10.0000 mg | ORAL_TABLET | Freq: Every day | ORAL | Status: AC

## 2015-11-22 MED ORDER — PREDNISONE 10 MG (21) PO TBPK: 10.0000 mg | ORAL_TABLET | Freq: Every day | ORAL | Status: DC

## 2015-11-24 ENCOUNTER — Inpatient Hospital Stay: Admission: RE | Admit: 2015-11-24 | Payer: Medicaid Other | Source: Ambulatory Visit

## 2015-11-24 ENCOUNTER — Encounter: Payer: Medicaid Other | Admitting: Cardiothoracic Surgery

## 2015-11-26 ENCOUNTER — Other Ambulatory Visit: Payer: Self-pay | Admitting: *Deleted

## 2015-11-26 ENCOUNTER — Ambulatory Visit (HOSPITAL_COMMUNITY): Payer: Medicaid Other

## 2015-11-26 DIAGNOSIS — Z87448 Personal history of other diseases of urinary system: Secondary | ICD-10-CM

## 2015-12-01 ENCOUNTER — Ambulatory Visit (HOSPITAL_COMMUNITY): Payer: Medicaid Other

## 2015-12-04 ENCOUNTER — Other Ambulatory Visit (HOSPITAL_COMMUNITY): Payer: Self-pay | Admitting: Internal Medicine

## 2015-12-04 ENCOUNTER — Encounter (HOSPITAL_COMMUNITY): Payer: Self-pay | Admitting: *Deleted

## 2015-12-04 ENCOUNTER — Observation Stay (HOSPITAL_COMMUNITY)
Admission: EM | Admit: 2015-12-04 | Discharge: 2015-12-07 | Disposition: A | Discharge status: Home / Self Care | Payer: Medicaid Other | Attending: Family Medicine | Admitting: Family Medicine

## 2015-12-04 ENCOUNTER — Emergency Department (HOSPITAL_COMMUNITY): Payer: Medicaid Other

## 2015-12-04 DIAGNOSIS — M109 Gout, unspecified: Secondary | ICD-10-CM | POA: Diagnosis not present

## 2015-12-04 DIAGNOSIS — Z7982 Long term (current) use of aspirin: Secondary | ICD-10-CM | POA: Insufficient documentation

## 2015-12-04 DIAGNOSIS — N183 Chronic kidney disease, stage 3 unspecified: Secondary | ICD-10-CM | POA: Diagnosis present

## 2015-12-04 DIAGNOSIS — I6529 Occlusion and stenosis of unspecified carotid artery: Secondary | ICD-10-CM | POA: Diagnosis not present

## 2015-12-04 DIAGNOSIS — I7123 Aneurysm of the descending thoracic aorta, without rupture: Secondary | ICD-10-CM | POA: Insufficient documentation

## 2015-12-04 DIAGNOSIS — I7103 Dissection of thoracoabdominal aorta: Secondary | ICD-10-CM | POA: Diagnosis present

## 2015-12-04 DIAGNOSIS — I25810 Atherosclerosis of coronary artery bypass graft(s) without angina pectoris: Secondary | ICD-10-CM | POA: Diagnosis present

## 2015-12-04 DIAGNOSIS — R079 Chest pain, unspecified: Secondary | ICD-10-CM | POA: Diagnosis present

## 2015-12-04 DIAGNOSIS — Z7902 Long term (current) use of antithrombotics/antiplatelets: Secondary | ICD-10-CM | POA: Diagnosis not present

## 2015-12-04 DIAGNOSIS — Z79899 Other long term (current) drug therapy: Secondary | ICD-10-CM | POA: Insufficient documentation

## 2015-12-04 DIAGNOSIS — N189 Chronic kidney disease, unspecified: Secondary | ICD-10-CM | POA: Insufficient documentation

## 2015-12-04 DIAGNOSIS — I129 Hypertensive chronic kidney disease with stage 1 through stage 4 chronic kidney disease, or unspecified chronic kidney disease: Secondary | ICD-10-CM | POA: Insufficient documentation

## 2015-12-04 DIAGNOSIS — Z85850 Personal history of malignant neoplasm of thyroid: Secondary | ICD-10-CM | POA: Insufficient documentation

## 2015-12-04 DIAGNOSIS — I5023 Acute on chronic systolic (congestive) heart failure: Secondary | ICD-10-CM | POA: Diagnosis present

## 2015-12-04 DIAGNOSIS — E785 Hyperlipidemia, unspecified: Secondary | ICD-10-CM | POA: Diagnosis not present

## 2015-12-04 DIAGNOSIS — I200 Unstable angina: Secondary | ICD-10-CM | POA: Diagnosis present

## 2015-12-04 DIAGNOSIS — I5022 Chronic systolic (congestive) heart failure: Secondary | ICD-10-CM | POA: Diagnosis present

## 2015-12-04 DIAGNOSIS — D649 Anemia, unspecified: Secondary | ICD-10-CM | POA: Diagnosis not present

## 2015-12-04 DIAGNOSIS — I2510 Atherosclerotic heart disease of native coronary artery without angina pectoris: Secondary | ICD-10-CM | POA: Insufficient documentation

## 2015-12-04 DIAGNOSIS — I10 Essential (primary) hypertension: Secondary | ICD-10-CM | POA: Diagnosis present

## 2015-12-04 DIAGNOSIS — I712 Thoracic aortic aneurysm, without rupture: Secondary | ICD-10-CM

## 2015-12-04 HISTORY — DX: Heart failure, unspecified: I50.9

## 2015-12-04 LAB — BASIC METABOLIC PANEL
Anion gap: 7 (ref 5–15)
BUN: 16 mg/dL (ref 6–20)
CHLORIDE: 107 mmol/L (ref 101–111)
CO2: 27 mmol/L (ref 22–32)
Calcium: 9 mg/dL (ref 8.9–10.3)
Creatinine, Ser: 1.39 mg/dL — ABNORMAL HIGH (ref 0.61–1.24)
GFR, EST NON AFRICAN AMERICAN: 57 mL/min — AB (ref >60)
Glucose, Bld: 91 mg/dL (ref 65–99)
POTASSIUM: 3.6 mmol/L (ref 3.5–5.1)
SODIUM: 141 mmol/L (ref 135–145)

## 2015-12-04 LAB — CBC
HEMATOCRIT: 42.7 % (ref 39.0–52.0)
HEMOGLOBIN: 14.1 g/dL (ref 13.0–17.0)
MCH: 27.5 pg (ref 26.0–34.0)
MCHC: 33 g/dL (ref 30.0–36.0)
MCV: 83.4 fL (ref 78.0–100.0)
Platelets: 147 10*3/uL — ABNORMAL LOW (ref 150–400)
RBC: 5.12 MIL/uL (ref 4.22–5.81)
RDW: 14.5 % (ref 11.5–15.5)
WBC: 4 10*3/uL (ref 4.0–10.5)

## 2015-12-04 LAB — I-STAT TROPONIN, ED: TROPONIN I, POC: 0.02 ng/mL (ref 0.00–0.08)

## 2015-12-04 MED ORDER — METHYLPREDNISOLONE SODIUM SUCC 125 MG IJ SOLR
125.0000 mg | Freq: Once | INTRAMUSCULAR | Status: AC
  Administered 2015-12-04: 125 mg via INTRAVENOUS
  Filled 2015-12-04: qty 2

## 2015-12-04 MED ORDER — MORPHINE SULFATE (PF) 4 MG/ML IV SOLN
4.0000 mg | Freq: Once | INTRAVENOUS | Status: AC
Start: 2015-12-04 — End: 2015-12-04
  Administered 2015-12-04: 4 mg via INTRAVENOUS
  Filled 2015-12-04: qty 1

## 2015-12-04 MED ORDER — SODIUM CHLORIDE 0.9 % IV SOLN
INTRAVENOUS | Status: DC
  Administered 2015-12-04: via INTRAVENOUS

## 2015-12-04 MED ORDER — DIPHENHYDRAMINE HCL 50 MG/ML IJ SOLN
50.0000 mg | Freq: Once | INTRAMUSCULAR | Status: AC
  Administered 2015-12-04: 50 mg via INTRAVENOUS
  Filled 2015-12-04: qty 1

## 2015-12-04 MED ORDER — ONDANSETRON HCL 4 MG/2ML IJ SOLN
4.0000 mg | Freq: Once | INTRAMUSCULAR | Status: AC
  Administered 2015-12-04: 4 mg via INTRAVENOUS
  Filled 2015-12-04: qty 2

## 2015-12-04 NOTE — ED Notes (Signed)
Patient presents stating he was on a flight from [place] and started with CP while in the air.  History of same with pacemaker

## 2015-12-04 NOTE — ED Provider Notes (Signed)
By signing my name below, I, Chad [name], attest that this documentation has been prepared under the direction and in the presence of [employer], DO. Electronically Signed: Randa [name], ED Scribe. 12/04/2015. 11:16 PM.  TIME SEEN: 11:16 PM   CHIEF COMPLAINT: Chest Pain  HPI: HPI Comments: Chad [name] is a 54 y.o. male with PMHx CAD s/p CABG in 2006 and DES to native PDA in 2011, HTN, chronic systolic heart failure s/p pacemaker/AICD, type I aortic dissection s/p repair in July 2010, CKD who presents to the Emergency Department complaining of recurrent CP onset this morning while he was on the flight home from [place]. Pt report that during the onset of pain he had associated light headedness, vertigo, palpitations, chest pressure, SOB and slight nausea. He describes the pain also as a "ripping" pain. Pt states that while on the flight he tried to focus straight ahead to help with the dizziness. Pt reports Hx similar CP but not the worst he he has had. He states that while on vacation he ran out of his blood pressure medication. Denies any new worsening numbness, weakness, back pain or bowel/bladder incontinence. Pt reports Hx of chronic back pain and sciatica. Reports he has chronic paresthesias in bilateral lower extremities which is unchanged.  [facility] Cardiologist is Dr. Sung Amabile  ROS: See HPI Constitutional: no fever  Eyes: no drainage  ENT: no runny nose   Cardiovascular:   chest pain  Resp:  SOB  GI: no vomiting GU: no dysuria Integumentary: no rash  Allergy: no hives  Musculoskeletal: no leg swelling  Neurological: no slurred speech ROS otherwise negative  PAST MEDICAL HISTORY/PAST SURGICAL HISTORY:  Past Medical History  Diagnosis Date  . CAD (coronary artery disease)     a. s/p CABG 2006;  b. DES to PDA 2011 (cath: Dx not seen, dRCA/PDA tx with DES; S-PDA occluded ([place]), S-Dx occluded, S-RI and OM ok, L-LAD ok  . HTN  (hypertension)     severe  . Chronic systolic heart failure (HCC)     a. 12/13 ECHO: EF 35-40%, sept, apical & posterobasal HK, LV mod dil & sys fx mod reduced, mild AI, MV mild reg, TV mild reg  . Aortic dissection, thoracoabdominal ([place])     7/10: Type I s/p repair  . CRI (chronic renal insufficiency)   . Thyroid cancer ([place])     H[employer]  . HLD (hyperlipidemia)   . Anemia   . Gout   . AICD (automatic cardioverter/defibrillator) present   . Carotid stenosis     dopplers 2011: 0-39% bilat.  . Chest pain syndrome     MEDICATIONS:  Prior to Admission medications   Medication Sig Start Date End Date Taking? Authorizing Provider  amLODipine (NORVASC) 5 MG tablet Take 1 tablet (5 mg total) by mouth 2 (two) times daily. 09/28/15   Jolaine Artist, MD  aspirin EC 81 MG tablet Take 1 tablet (81 mg total) by mouth daily. 04/12/15   Larey Dresser, MD  b complex vitamins tablet Take 2 tablets by mouth daily.     Historical Provider, MD  carvedilol (COREG) 25 MG tablet Take 1 tablet (25 mg total) by mouth 2 (two) times daily with a meal. 09/22/15   Brett Canales, PA-C  carvedilol (COREG) 25 MG tablet TAKE ONE TABLET BY MOUTH TWICE DAILY WITH MEALS 11/11/15   Jolaine Artist, MD  clopidogrel (PLAVIX) 75 MG tablet Take 1 tablet (75 mg total)  by mouth at bedtime. 09/28/15   Jolaine Artist, MD  clopidogrel (PLAVIX) 75 MG tablet TAKE ONE TABLET BY MOUTH DAILY 10/13/15   Amy D Clegg, NP  cyclobenzaprine (FLEXERIL) 10 MG tablet Take 10 mg by mouth daily as needed for muscle spasms.     Historical Provider, MD  diphenhydrAMINE (BENADRYL) 25 mg capsule Take 2 capsules (50 mg total) by mouth once. 11/24/15   Ivin Poot, MD  diphenhydrAMINE (BENADRYL) 25 MG tablet Take 12.5 mg by mouth 2 (two) times daily as needed for itching or allergies.    Historical Provider, MD  ezetimibe (ZETIA) 10 MG tablet Take 1 tablet (10 mg total) by mouth daily. 09/28/15   Jolaine Artist, MD  furosemide  (LASIX) 40 MG tablet Take 1 tablet (40 mg total) by mouth every other day. Take daily for 3 days then take one tablet  every other day Patient taking differently: Take 40 mg by mouth every other day.  09/28/15   Jolaine Artist, MD  hydrALAZINE (APRESOLINE) 50 MG tablet Take 1.5 tablets (75 mg total) by mouth 3 (three) times daily. 09/28/15   Jolaine Artist, MD  HYDROcodone-acetaminophen (NORCO/VICODIN) 5-325 MG tablet Take 1-2 tablets by mouth every 6 (six) hours as needed. Patient not taking: Reported on 10/22/2015 10/05/15   Merryl Hacker, MD  isosorbide mononitrate (IMDUR) 60 MG 24 hr tablet Take 1 tablet (60 mg total) by mouth 2 (two) times daily. 09/28/15   Jolaine Artist, MD  loratadine (CLARITIN) 10 MG tablet Take 1 tablet (10 mg total) by mouth daily as needed for allergies. Patient not taking: Reported on 10/22/2015 03/26/15   Amy D Ninfa Meeker, NP  Multiple Vitamin (MULTIVITAMIN WITH MINERALS) TABS tablet Take 1 tablet by mouth daily. Mega Men 50 Plus    Historical Provider, MD  mupirocin nasal ointment (BACTROBAN NASAL) 2 % Use small amount of ointment in right nostril (in affected area) twice a day for 10 days 10/22/15   Smiley Houseman, MD  nitroGLYCERIN (NITROSTAT) 0.4 MG SL tablet Place 1 tablet (0.4 mg total) under the tongue every 5 (five) minutes x 3 doses as needed for chest pain. Patient not taking: Reported on 10/22/2015 09/28/15   Jolaine Artist, MD  Oxycodone HCl 10 MG TABS Take 10 mg by mouth 4 (four) times daily.    Historical Provider, MD  potassium chloride (K-DUR) 10 MEQ tablet Take 1 tablet (10 mEq total) by mouth daily. 09/28/15   Jolaine Artist, MD  pregabalin (LYRICA) 75 MG capsule Take 75 mg by mouth 2 (two) times daily.    Historical Provider, MD    ALLERGIES:  Allergies  Allergen Reactions  . Iohexol Anaphylaxis and Other (See Comments)     Desc: PT HAS ANAPHYLAXIS WITH CONTRAST MEDIA!  . Lipitor [Atorvastatin Calcium] Anaphylaxis and Other (See  Comments)    Large doses  . Shellfish Allergy Anaphylaxis  . Sulfonamide Derivatives Shortness Of Breath  . Latex Rash    With long periods of exposure  . Zocor [Simvastatin] Other (See Comments)    Muscle cramps    SOCIAL HISTORY:  Social History  Substance Use Topics  . Smoking status: Never Smoker   . Smokeless tobacco: Never Used  . Alcohol Use: No    FAMILY HISTORY: Family History  Problem Relation Age of Onset  . Coronary artery disease    . Hypertension Father   . Heart disease Father   . Early death Father   .  COPD Father   . Hypertension Mother     EXAM: BP 130/92 mmHg  Pulse 59  Temp(Src) 97.7 F (36.5 C) (Oral)  Resp 13  Ht 5\' 11"  (1.803 m)  Wt 176 lb (79.833 kg)  BMI 24.56 kg/m2  SpO2 99%   CONSTITUTIONAL: Alert and oriented and responds appropriately to questions. Appears uncomfortable HEAD: Normocephalic EYES: Conjunctivae clear, PERRL ENT: normal nose; no rhinorrhea; moist mucous membranes; pharynx without lesions noted NECK: Supple, no meningismus, no LAD  CARD: RRR; S1 and S2 appreciated; no murmurs, no clicks, no rubs, no gallops RESP: Normal chest excursion without splinting or tachypnea; breath sounds clear and equal bilaterally; no wheezes, no rhonchi, no rales, no hypoxia or respiratory distress, speaking full sentences ABD/GI: Normal bowel sounds; non-distended; soft, non-tender, no rebound, no guarding, no peritoneal signs BACK:  The back appears normal and is tender over the lower lumbar spine without step-off or deformity, there is no CVA tenderness EXT: Normal ROM in all joints; non-tender to palpation; no edema; normal capillary refill; no cyanosis, no calf tenderness or swelling; equal pulses in all 4 extremities    SKIN: Normal color for age and race; warm NEURO: Moves all extremities equally, sensation to light touch intact diffusely, cranial nerves II through XII intact, reports paresthesias in bilateral lower extremities which is  chronic PSYCH: The patient's mood and manner are appropriate. Grooming and personal hygiene are appropriate.  MEDICAL DECISION MAKING: Patient here with complaints of chest pain that he described as a ripping, pressure. Does have a significant history of coronary artery disease as well as a type I aortic dissection. EKG shows no new ischemic changes. First troponin negative. Chest x-ray shows stable cardiomegaly. No edema. He does not appear volume overloaded. Concern for possible dissection, aneurysm given patient's pain. Blood pressure is 138/92. He does state he is allergic to contrast dye but has been able to tolerate CTs with contrast after Solu-Medrol and Benadryl. Discussed with radiologist Dr. Marisue Humble who recommends one hour prep with sinus drainage and Benadryl. Will then obtain CT of his chest, abdomen and pelvis for further evaluation of his aorta. Anticipate admission for chest pain rule out. His last stress test was in February 2016.  ED PROGRESS: Patient's chest pain free after morphine. Still having a mild amount of chronic back pain. We'll give another dose of morphine. CT scan shows stable aortic graft. He does have some dilation of the lower abdominal aorta this previously 2.8 cm and is now 3.27 m. No soft tissue stranding and no free fluid in the belly. He has no abdominal pain or distention on exam. Repeat troponin is negative. Have discussed with family medicine resident for admission for chest pain rule out. Attending is Dr. Rory Percy. I will place holding orders for telemetry, observation. Patient is comfortable with this plan. He is not having any reaction after IV contrast.    EKG Interpretation  Date/Time:  Saturday December 04 2015 22:27:06 EST Ventricular Rate:  64 PR Interval:  186 QRS Duration: 110 QT Interval:  416 QTC Calculation: 429 R Axis:   4 Text Interpretation:  Sinus rhythm with occasional Premature ventricular complexes Incomplete left bundle branch  block Left ventricular hypertrophy with repolarization abnormality Abnormal ECG No significant change since last tracing Reconfirmed by Athenia Rys,  DO, Masiyah Jorstad [phone]) on 12/04/2015 11:24:41 PM           I personally performed the services described in this documentation, which was scribed in my presence. The recorded information  has been reviewed and is accurate.    [place], DO 12/05/15 [id number]

## 2015-12-05 ENCOUNTER — Encounter (HOSPITAL_COMMUNITY): Payer: Self-pay | Admitting: Radiology

## 2015-12-05 ENCOUNTER — Emergency Department (HOSPITAL_COMMUNITY): Payer: Medicaid Other

## 2015-12-05 DIAGNOSIS — I5022 Chronic systolic (congestive) heart failure: Secondary | ICD-10-CM | POA: Diagnosis not present

## 2015-12-05 DIAGNOSIS — I5023 Acute on chronic systolic (congestive) heart failure: Secondary | ICD-10-CM | POA: Diagnosis not present

## 2015-12-05 DIAGNOSIS — I25700 Atherosclerosis of coronary artery bypass graft(s), unspecified, with unstable angina pectoris: Secondary | ICD-10-CM

## 2015-12-05 DIAGNOSIS — I712 Thoracic aortic aneurysm, without rupture: Secondary | ICD-10-CM | POA: Diagnosis not present

## 2015-12-05 DIAGNOSIS — N183 Chronic kidney disease, stage 3 (moderate): Secondary | ICD-10-CM | POA: Diagnosis not present

## 2015-12-05 DIAGNOSIS — R079 Chest pain, unspecified: Secondary | ICD-10-CM | POA: Diagnosis not present

## 2015-12-05 DIAGNOSIS — I7103 Dissection of thoracoabdominal aorta: Secondary | ICD-10-CM

## 2015-12-05 DIAGNOSIS — I10 Essential (primary) hypertension: Secondary | ICD-10-CM

## 2015-12-05 DIAGNOSIS — I7123 Aneurysm of the descending thoracic aorta, without rupture: Secondary | ICD-10-CM | POA: Insufficient documentation

## 2015-12-05 DIAGNOSIS — I209 Angina pectoris, unspecified: Secondary | ICD-10-CM

## 2015-12-05 DIAGNOSIS — I2510 Atherosclerotic heart disease of native coronary artery without angina pectoris: Secondary | ICD-10-CM | POA: Diagnosis not present

## 2015-12-05 LAB — URINALYSIS, ROUTINE W REFLEX MICROSCOPIC
Bilirubin Urine: NEGATIVE
GLUCOSE, UA: NEGATIVE mg/dL
Hgb urine dipstick: NEGATIVE
Ketones, ur: NEGATIVE mg/dL
LEUKOCYTES UA: NEGATIVE
Nitrite: NEGATIVE
PROTEIN: NEGATIVE mg/dL
Specific Gravity, Urine: 1.034 — ABNORMAL HIGH (ref 1.005–1.030)
pH: 6 (ref 5.0–8.0)

## 2015-12-05 LAB — TROPONIN I: Troponin I: <0.03 ng/mL (ref <0.031)

## 2015-12-05 LAB — I-STAT TROPONIN, ED: Troponin i, poc: 0.01 ng/mL (ref 0.00–0.08)

## 2015-12-05 MED ORDER — SPIRONOLACTONE 25 MG PO TABS
12.5000 mg | ORAL_TABLET | Freq: Every day | ORAL | Status: DC
  Administered 2015-12-05 – 2015-12-07 (×3): 12.5 mg via ORAL
  Filled 2015-12-05 (×3): qty 1

## 2015-12-05 MED ORDER — OXYCODONE HCL 5 MG PO TABS
10.0000 mg | ORAL_TABLET | Freq: Four times a day (QID) | ORAL | Status: DC | PRN
  Administered 2015-12-05 – 2015-12-07 (×5): 10 mg via ORAL
  Filled 2015-12-05 (×6): qty 2

## 2015-12-05 MED ORDER — ASPIRIN EC 81 MG PO TBEC
81.0000 mg | DELAYED_RELEASE_TABLET | Freq: Every day | ORAL | Status: DC
  Administered 2015-12-05 – 2015-12-07 (×3): 81 mg via ORAL
  Filled 2015-12-05 (×3): qty 1

## 2015-12-05 MED ORDER — PREGABALIN 50 MG PO CAPS
75.0000 mg | ORAL_CAPSULE | Freq: Two times a day (BID) | ORAL | Status: DC
  Administered 2015-12-05 – 2015-12-07 (×5): 75 mg via ORAL
  Filled 2015-12-05 (×5): qty 1

## 2015-12-05 MED ORDER — CYCLOBENZAPRINE HCL 10 MG PO TABS: 10.0000 mg | ORAL_TABLET | Freq: Every day | ORAL | Status: DC | PRN

## 2015-12-05 MED ORDER — ISOSORBIDE MONONITRATE ER 60 MG PO TB24
60.0000 mg | ORAL_TABLET | Freq: Two times a day (BID) | ORAL | Status: DC
  Administered 2015-12-05 – 2015-12-07 (×6): 60 mg via ORAL
  Filled 2015-12-05 (×6): qty 1

## 2015-12-05 MED ORDER — CLOPIDOGREL BISULFATE 75 MG PO TABS
75.0000 mg | ORAL_TABLET | Freq: Every day | ORAL | Status: DC
  Administered 2015-12-05 – 2015-12-06 (×2): 75 mg via ORAL
  Filled 2015-12-05 (×2): qty 1

## 2015-12-05 MED ORDER — POTASSIUM CHLORIDE CRYS ER 20 MEQ PO TBCR
40.0000 meq | EXTENDED_RELEASE_TABLET | Freq: Once | ORAL | Status: AC
  Administered 2015-12-05: 40 meq via ORAL
  Filled 2015-12-05: qty 2

## 2015-12-05 MED ORDER — ADULT MULTIVITAMIN W/MINERALS CH
1.0000 | ORAL_TABLET | Freq: Every day | ORAL | Status: DC
  Administered 2015-12-05 – 2015-12-07 (×3): 1 via ORAL
  Filled 2015-12-05 (×3): qty 1

## 2015-12-05 MED ORDER — EZETIMIBE 10 MG PO TABS
10.0000 mg | ORAL_TABLET | Freq: Every day | ORAL | Status: DC
  Administered 2015-12-05 – 2015-12-07 (×3): 10 mg via ORAL
  Filled 2015-12-05 (×3): qty 1

## 2015-12-05 MED ORDER — IOHEXOL 350 MG/ML SOLN
100.0000 mL | Freq: Once | INTRAVENOUS | Status: AC | PRN
  Administered 2015-12-05: 100 mL via INTRAVENOUS

## 2015-12-05 MED ORDER — HEPARIN SODIUM (PORCINE) 5000 UNIT/ML IJ SOLN
5000.0000 [IU] | Freq: Three times a day (TID) | INTRAMUSCULAR | Status: DC
Start: 2015-12-05 — End: 2015-12-07
  Filled 2015-12-05 (×5): qty 1

## 2015-12-05 MED ORDER — ONDANSETRON HCL 4 MG/2ML IJ SOLN: 4.0000 mg | Freq: Four times a day (QID) | INTRAMUSCULAR | Status: DC | PRN

## 2015-12-05 MED ORDER — MORPHINE SULFATE (PF) 2 MG/ML IV SOLN
2.0000 mg | Freq: Once | INTRAVENOUS | Status: AC
  Administered 2015-12-05: 2 mg via INTRAVENOUS
  Filled 2015-12-05: qty 1

## 2015-12-05 MED ORDER — FUROSEMIDE 10 MG/ML IJ SOLN
40.0000 mg | Freq: Two times a day (BID) | INTRAMUSCULAR | Status: DC
  Administered 2015-12-05 – 2015-12-06 (×3): 40 mg via INTRAVENOUS
  Filled 2015-12-05 (×3): qty 4

## 2015-12-05 MED ORDER — DIPHENHYDRAMINE HCL 12.5 MG/5ML PO ELIX
12.5000 mg | ORAL_SOLUTION | Freq: Two times a day (BID) | ORAL | Status: DC | PRN
  Filled 2015-12-05: qty 5

## 2015-12-05 MED ORDER — NITROGLYCERIN 0.4 MG SL SUBL
0.4000 mg | SUBLINGUAL_TABLET | SUBLINGUAL | Status: DC | PRN
  Administered 2015-12-05 (×3): 0.4 mg via SUBLINGUAL
  Filled 2015-12-05: qty 1

## 2015-12-05 MED ORDER — MORPHINE SULFATE (PF) 4 MG/ML IV SOLN
4.0000 mg | Freq: Once | INTRAVENOUS | Status: AC
  Administered 2015-12-05: 4 mg via INTRAVENOUS
  Filled 2015-12-05: qty 1

## 2015-12-05 MED ORDER — CARVEDILOL 25 MG PO TABS
25.0000 mg | ORAL_TABLET | Freq: Two times a day (BID) | ORAL | Status: DC
  Administered 2015-12-05 – 2015-12-07 (×5): 25 mg via ORAL
  Filled 2015-12-05 (×5): qty 1

## 2015-12-05 MED ORDER — HYDRALAZINE HCL 50 MG PO TABS
75.0000 mg | ORAL_TABLET | Freq: Three times a day (TID) | ORAL | Status: DC
  Administered 2015-12-05 – 2015-12-07 (×8): 75 mg via ORAL
  Filled 2015-12-05 (×8): qty 1

## 2015-12-05 MED ORDER — AMLODIPINE BESYLATE 5 MG PO TABS
5.0000 mg | ORAL_TABLET | Freq: Two times a day (BID) | ORAL | Status: DC
  Administered 2015-12-05 – 2015-12-07 (×5): 5 mg via ORAL
  Filled 2015-12-05 (×5): qty 1

## 2015-12-05 MED ORDER — ACETAMINOPHEN 325 MG PO TABS
650.0000 mg | ORAL_TABLET | ORAL | Status: DC | PRN
  Administered 2015-12-05: 650 mg via ORAL
  Filled 2015-12-05: qty 2

## 2015-12-05 NOTE — H&P (Signed)
[facility] Admission History and Physical Service Pager: [phone]  Patient name: Chad [name] Medical record number: [id number] Date of birth: August 05, 1961 Age: 54 y.o. Gender: male  Primary Care Provider: [name], MD Consultants: none Code Status: FULL  Chief Complaint: chest pain  Assessment and Plan: Serafin Duty is a 54 y.o. male presenting with chest pain . PMH is significant for systolic heart failure s/p pacemaker, hx type I aortic dissection s/p repair (2010), CKD, HTN, and CAD s/p CABG (2006).  Chest pain: One episode of "ripping" pain occurring during flight from [facility] to [place]. Given patient's history and description pain, concern for aortic dissection. However, chest CT showed no dissection, but some dilation of lower abdominal aorta (previously 2.8cm, now 3.2cm). ED EKG showed sinus rhythm with occasional PVCs. Troponin negx2 in ED. Chest pain improved after morphine in ED, but patient did endorse some chest pressure during my exam.  - Place in observation with telemetry, attending Dr. Ardelia Mems - Repeat EKG in AM - Cycle troponins - Consult cardiology in AM, appreciate recs (Pt followed closely by Dr. Haroldine Laws) - Will need outpatient f/u of AAA  - Nitroglycerin PRN - Continue Plavix - Cont ASA 81 mg  HTN: BPs 130s/70s in ED. - Continue Norvasc, Coreg, hydralazine, Lasix  CKD: Cr 1.39 on admission, which appears to be slightly lower than patient's baseline of ~1.5 (from past year).  - Monitor Cr  Chronic pain/sciatica: - Continue home oxycodone, Flexeril, Lyrica  CAD: s/p CABG - Continue Imdur, coreg, zetia  FEN/GI: Heart healthy diet Prophylaxis: subQ heparin  Disposition: place in observation with telemetry  History of Present Illness:  Chad [name] is a 54 y.o. male presenting with 15 minutes of "ripping" chest pain while flying back from [facility].    The pain was accompanied by SOB, nausea, sweating,  lightheadedness and dizziness. He reports that during his trip he ran out of his blood pressure medication, and as such has not taken his BP meds in 24 hours. Until this flight he had been feeling well without new symptoms.   He reports that normally he has intermittent chest pain but it has been happening more often recently. He takes nitro which generally relieves the pain. The pain is usually short lived and not accompanied by the other symptoms. He usually has swelling in his legs and monitors this and takes lasix PRN. He has noticed swelling recently. He has felt a flutter and swoosh in his chest when he sits quietly. He reports that when he has had his heart attacks he felt the pain in his back between his shoulder blades. He denies back pain today.  Review Of Systems: Per HPI with the following additions: endorses HA with vision change during the HA, otherwise no visual changes Otherwise the remainder of the systems were negative.  Patient Active Problem List   Diagnosis Date Noted  . Nasal folliculitis [id number]  . Chronic systolic CHF (congestive heart failure) ([place]) 04/12/2015  . CKD (chronic kidney disease) stage 3, GFR 30-59 ml/min 04/12/2015  . Unstable angina ([place]) 01/25/2015  . Chest pain 10/10/2014  . Health care maintenance 03/19/2014  . Local reaction to tetanus vaccine 03/19/2014  . Neck pain on left side 06/03/2013  . Penile discharge 02/26/2013  . Syncope 01/17/2013  . Acute on chronic systolic and diastolic heart failure, NYHA class 3 ([place]) 01/16/2013  . Chest pain, mid sternal 01/16/2013  . Low back pain 04/23/2012  . Chest pain 12/04/2011  .  Chest pain syndrome   . Insomnia 09/14/2011  . 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 Coronary atherosclerosis of native coronary artery 06/12/2011  . Bradycardia 05/03/2011  . Aortic dissection, thoracoabdominal ([place])   . Chronic kidney disease (CKD), stage II (mild)   . ORTHOSTATIC  HYPOTENSION 05/03/2010  . CAROTID BRUIT 01/25/2010  . PANCYTOPENIA 11/01/2009  . FATIGUE / MALAISE 10/25/2009  . DISSECTING AORTIC ANEURYSM THORACOABDOMINAL 07/14/2009  . SCIATICA, RIGHT 05/18/2009  . HYPERLIPIDEMIA-MIXED 09/29/2008  . HYPERTENSION, BENIGN 09/29/2008  . CAD, ARTERY BYPASS GRAFT 09/29/2008  . SYSTOLIC HEART FAILURE, CHRONIC 09/29/2008  . ICD - IN SITU 09/29/2008    Past Medical History: Past Medical History  Diagnosis Date  . CAD (coronary artery disease)     a. s/p CABG 2006;  b. DES to PDA 2011 (cath: Dx not seen, dRCA/PDA tx with DES; S-PDA occluded (culprit), S-Dx occluded, S-RI and OM ok, L-LAD ok  . HTN (hypertension)     severe  . Chronic systolic heart failure (HCC)     a. 12/13 ECHO: EF 35-40%, sept, apical & posterobasal HK, LV mod dil & sys fx mod reduced, mild AI, MV mild reg, TV mild reg  . Aortic dissection, thoracoabdominal ([place])     7/10: Type I s/p repair  . CRI (chronic renal insufficiency)   . Thyroid cancer ([place])     [employer]  . HLD (hyperlipidemia)   . Anemia   . Gout   . AICD (automatic cardioverter/defibrillator) present   . Carotid stenosis     dopplers 2011: 0-39% bilat.  . Chest pain syndrome   . CHF (congestive heart failure) ([place])     Past Surgical History: Past Surgical History  Procedure Laterality Date  . Coronary artery bypass graft  06/21/2009    x2  . Cardiac defibrillator placement      [employer]  . Thyroidectomy, partial  06/20/11  . Lumbar disc surgery      x 2, most recent within 5-10 years  . Status post emergency repair of a type a ascending aortic dissection with a hemiarch reconstruction of the ascending aorta  using a 28-mm hemashield graft with redo sternotomy and revision of previous bypass grafts in june 2010.    . Median sternotomy,cabg x 7  06/29/2005  . Implantable cardioverter defibrillator (icd) generator change N/A 12/13/2012    Procedure: ICD GENERATOR CHANGE;  Surgeon: Evans Lance, MD;   Location: [facility] CATH LAB;  Service: Cardiovascular;  Laterality: N/A;    Social History: Social History  Substance Use Topics  . Smoking status: Never Smoker   . Smokeless tobacco: Never Used  . Alcohol Use: No    Please also refer to relevant sections of EMR.  Family History: Family History  Problem Relation Age of Onset  . Coronary artery disease    . Hypertension Father   . Heart disease Father   . Early death Father   . COPD Father   . Hypertension Mother    Allergies and Medications: Allergies  Allergen Reactions  . Iohexol Anaphylaxis and Other (See Comments)     Desc: PT HAS ANAPHYLAXIS WITH CONTRAST MEDIA!  . Lipitor [Atorvastatin Calcium] Anaphylaxis and Other (See Comments)    Large doses  . Shellfish Allergy Anaphylaxis  . Sulfonamide Derivatives Shortness Of Breath  . Latex Rash    With long periods of exposure  . Zocor [Simvastatin] Other (See Comments)    Muscle cramps   No current facility-administered  medications on file prior to encounter.   Current Outpatient Prescriptions on File Prior to Encounter  Medication Sig Dispense Refill  . amLODipine (NORVASC) 5 MG tablet Take 1 tablet (5 mg total) by mouth 2 (two) times daily. 60 tablet 6  . aspirin EC 81 MG tablet Take 1 tablet (81 mg total) by mouth daily. 90 tablet 3  . b complex vitamins tablet Take 2 tablets by mouth daily.     . carvedilol (COREG) 25 MG tablet Take 1 tablet (25 mg total) by mouth 2 (two) times daily with a meal. 60 tablet 6  . clopidogrel (PLAVIX) 75 MG tablet Take 1 tablet (75 mg total) by mouth at bedtime. 30 tablet 6  . cyclobenzaprine (FLEXERIL) 10 MG tablet Take 10 mg by mouth daily as needed for muscle spasms.     . diphenhydrAMINE (BENADRYL) 25 MG tablet Take 12.5 mg by mouth 2 (two) times daily as needed for itching or allergies.    [name] ezetimibe (ZETIA) 10 MG tablet Take 1 tablet (10 mg total) by mouth daily. 30 tablet 6  . furosemide (LASIX) 40 MG tablet Take 1 tablet (40 mg  total) by mouth every other day. Take daily for 3 days then take one tablet  every other day (Patient taking differently: Take 40 mg by mouth daily as needed for fluid or edema. ) 25 tablet 6  . hydrALAZINE (APRESOLINE) 50 MG tablet Take 1.5 tablets (75 mg total) by mouth 3 (three) times daily. 135 tablet 6  . isosorbide mononitrate (IMDUR) 60 MG 24 hr tablet Take 1 tablet (60 mg total) by mouth 2 (two) times daily. 60 tablet 3  . Multiple Vitamin (MULTIVITAMIN WITH MINERALS) TABS tablet Take 1 tablet by mouth daily. Mega Men 50 Plus    . nitroGLYCERIN (NITROSTAT) 0.4 MG SL tablet Place 1 tablet (0.4 mg total) under the tongue every 5 (five) minutes x 3 doses as needed for chest pain. 30 tablet 12  . Oxycodone HCl 10 MG TABS Take 10 mg by mouth 4 (four) times daily.    . potassium chloride (K-DUR) 10 MEQ tablet Take 1 tablet (10 mEq total) by mouth daily. (Patient taking differently: Take 10-50 mEq by mouth daily as needed (cramps). ) 30 tablet 6  . pregabalin (LYRICA) 75 MG capsule Take 75 mg by mouth 2 (two) times daily.      Objective: BP 134/74 mmHg  Pulse 69  Temp(Src) 97.7 F (36.5 C) (Oral)  Resp 16  Ht 5\' 11"  (1.803 m)  Wt 176 lb (79.833 kg)  BMI 24.56 kg/m2  SpO2 97% Exam: General: very drowsy male lying in bed, fell asleep throughout encounter repeatedly (but recently received morphine and benadryl) Eyes: PERRLA, EOMI ENTM: MMM Neck: normal ROM Cardiovascular: RRR, no murmurs appreciated Respiratory: CTAB, no wheezes Abdomen: soft, non-tender, non-distended, +BS MSK: extremities warm and well-perfused, trace edema to midshin bilaterally Skin: no rashes or bruises noted Neuro: A&Ox3, no focal deficits Psych: appropriate mood and affect   Labs and Imaging: CBC BMET   Recent Labs Lab 12/04/15 2238  WBC 4.0  HGB 14.1  HCT 42.7  PLT 147*    Recent Labs Lab 12/04/15 2238  NA 141  K 3.6  CL 107  CO2 27  BUN 16  CREATININE 1.39*  GLUCOSE 91  CALCIUM 9.0      Dg Chest 2 View  12/04/2015  CLINICAL DATA:  Onset of chest pain while on a flight. EXAM: CHEST  2 VIEW COMPARISON:  Radiographs 10/04/2015, CT 10/11/2014 FINDINGS: Stable cardiomegaly and mediastinal contours, patient is post repair of the thoracic aortic dissection. Post median sternotomy with single lead left-sided pacemaker in place. No pulmonary edema, confluent airspace disease, pleural effusion or pneumothorax. No acute osseous abnormalities are seen. IMPRESSION: No acute process.  Stable cardiomegaly and mediastinal contours. Electronically Signed   By: Jeb Levering M.D.   On: 12/04/2015 23:21   Ct Angio Chest Aorta W/cm &/or Wo/cm  12/05/2015  CLINICAL DATA:  Chest pain. History of aortic dissection post repair 6 years prior. EXAM: CT ANGIOGRAPHY CHEST, ABDOMEN AND PELVIS TECHNIQUE: Multidetector CT imaging through the chest, abdomen and pelvis was performed using the standard protocol during bolus administration of intravenous contrast. Multiplanar reconstructed images and MIPs were obtained and reviewed to evaluate the vascular anatomy. CONTRAST:  100 mL Omnipaque 350 IV COMPARISON:  Most recent CTA 10/11/2014.  CT abdomen 03/21/2015 FINDINGS: CTA CHEST FINDINGS Aortic stent graft involving the ascending thoracic aorta which is patent and unchanged in appearance from prior exam. The aortic root measures 4.1 cm, previously 4.3 cm. Is cardiac motion through the aortic root. Again noted is aortic dissection at the aortic arch just be on the surgical graft. Overall appearance of the dissection is unchanged from prior exam. There is no extension of dissection to the great vessels from the aortic arch. The proximal descending thoracic aorta measures 4.4 cm, previously 4.5 cm. Dissection extends through the descending thoracic aorta with opacification of the false and true lumen. Overall size and configuration is unchanged. There is no aortic hematoma or surrounding soft tissue stranding. Multi  chamber cardiomegaly with pacemaker, unchanged in appearance. No adenopathy. Patulous esophagus. No filling defects in the central pulmonary arteries. No pleural or pericardial effusion. Mild dependent atelectasis in the lower lungs. No consolidation to suggest pneumonia. Absent left thyroid tissue. There are no acute or suspicious osseous abnormalities. Patient is post median sternotomy. Review of the MIP images confirms the above findings. CTA ABDOMEN AND PELVIS FINDINGS Aortic dissection involves the abdominal aorta through the common iliac arteries bilaterally. Dissection extends to the celiac trunk. Dissection involves the proximal SMA. IMA arises from the true lumen. The abdominal aorta at the level of the celiac trunk measures 4 cm, unchanged. Distal abdominal aorta measures 3.2 cm, previously 2.8 cm. Dominant SMA branches are patent. Right renal artery originates from the true lumen. Left renal artery originates from the true lumen. Dissection does not involve the internal or external iliac arteries or femoral arteries. Arterial phase imaging of the liver, adrenal glands, and pancreas are unremarkable. Stones or sludge in the gallbladder, physiologic gallbladder distention. Small cyst in the spleen, not definitively seen prior, however likely incidental. Chronic left renal atrophy with only minimal enhancement of the lower pole. Cortical scarring in the lower right kidney, unchanged. No right hydronephrosis. Stomach is decompressed. There are no dilated or thickened bowel loops. Moderate stool burden. Diverticulosis of the sigmoid colon without diverticulitis. Appendix tentatively identified. Resolution of previous pelvic free fluid. Fluid remains in both inguinal canals. IVC is patent.  No retroperitoneal adenopathy. Urinary bladder is decompressed, however thick walled. Prostate gland appears prominent. Advanced degenerative change in the lower lumbar spine. There are no acute or suspicious osseous  abnormalities. Review of the MIP images confirms the above findings. IMPRESSION: 1. Post ascending aortic graft which is patent and unchanged in configuration from prior exam. Configuration of aortic dissection distal to the aortic graft extending from the arch through the common iliac arteries is  unchanged from prior. The size of the lower abdominal aorta has progressed from prior exam, currently 3.2 cm, previously 2.8 cm. No surrounding soft tissue stranding. 2. Thick-walled urinary bladder, recommend correlation with urinalysis to exclude urinary tract infection. This appears greater than expected allowing for degree of distension. 3. Chronic findings as described. Electronically Signed   By: Jeb Levering M.D.   On: 12/05/2015 02:12   Ct Cta Abd/pel W/cm &/or W/o Cm  12/05/2015  CLINICAL DATA:  Chest pain. History of aortic dissection post repair 6 years prior. EXAM: CT ANGIOGRAPHY CHEST, ABDOMEN AND PELVIS TECHNIQUE: Multidetector CT imaging through the chest, abdomen and pelvis was performed using the standard protocol during bolus administration of intravenous contrast. Multiplanar reconstructed images and MIPs were obtained and reviewed to evaluate the vascular anatomy. CONTRAST:  100 mL Omnipaque 350 IV COMPARISON:  Most recent CTA 10/11/2014.  CT abdomen 03/21/2015 FINDINGS: CTA CHEST FINDINGS Aortic stent graft involving the ascending thoracic aorta which is patent and unchanged in appearance from prior exam. The aortic root measures 4.1 cm, previously 4.3 cm. Is cardiac motion through the aortic root. Again noted is aortic dissection at the aortic arch just be on the surgical graft. Overall appearance of the dissection is unchanged from prior exam. There is no extension of dissection to the great vessels from the aortic arch. The proximal descending thoracic aorta measures 4.4 cm, previously 4.5 cm. Dissection extends through the descending thoracic aorta with opacification of the false and true  lumen. Overall size and configuration is unchanged. There is no aortic hematoma or surrounding soft tissue stranding. Multi chamber cardiomegaly with pacemaker, unchanged in appearance. No adenopathy. Patulous esophagus. No filling defects in the central pulmonary arteries. No pleural or pericardial effusion. Mild dependent atelectasis in the lower lungs. No consolidation to suggest pneumonia. Absent left thyroid tissue. There are no acute or suspicious osseous abnormalities. Patient is post median sternotomy. Review of the MIP images confirms the above findings. CTA ABDOMEN AND PELVIS FINDINGS Aortic dissection involves the abdominal aorta through the common iliac arteries bilaterally. Dissection extends to the celiac trunk. Dissection involves the proximal SMA. IMA arises from the true lumen. The abdominal aorta at the level of the celiac trunk measures 4 cm, unchanged. Distal abdominal aorta measures 3.2 cm, previously 2.8 cm. Dominant SMA branches are patent. Right renal artery originates from the true lumen. Left renal artery originates from the true lumen. Dissection does not involve the internal or external iliac arteries or femoral arteries. Arterial phase imaging of the liver, adrenal glands, and pancreas are unremarkable. Stones or sludge in the gallbladder, physiologic gallbladder distention. Small cyst in the spleen, not definitively seen prior, however likely incidental. Chronic left renal atrophy with only minimal enhancement of the lower pole. Cortical scarring in the lower right kidney, unchanged. No right hydronephrosis. Stomach is decompressed. There are no dilated or thickened bowel loops. Moderate stool burden. Diverticulosis of the sigmoid colon without diverticulitis. Appendix tentatively identified. Resolution of previous pelvic free fluid. Fluid remains in both inguinal canals. IVC is patent.  No retroperitoneal adenopathy. Urinary bladder is decompressed, however thick walled. Prostate gland  appears prominent. Advanced degenerative change in the lower lumbar spine. There are no acute or suspicious osseous abnormalities. Review of the MIP images confirms the above findings. IMPRESSION: 1. Post ascending aortic graft which is patent and unchanged in configuration from prior exam. Configuration of aortic dissection distal to the aortic graft extending from the arch through the common iliac arteries is unchanged  from prior. The size of the lower abdominal aorta has progressed from prior exam, currently 3.2 cm, previously 2.8 cm. No surrounding soft tissue stranding. 2. Thick-walled urinary bladder, recommend correlation with urinalysis to exclude urinary tract infection. This appears greater than expected allowing for degree of distension. 3. Chronic findings as described. Electronically Signed   By: Jeb Levering M.D.   On: 12/05/2015 02:12    Verner Mould, MD 12/05/2015, 3:56 AM PGY-1, [place] Intern pager: [phone], text pages welcome  FPTS Upper-Level Resident Addendum  I have independently interviewed and examined the patient. I have discussed the above with the original author and agree with their documentation. My edits for correction/addition/clarification are in pink. Please see also any attending notes.   Frazier Richards, MD MPH PGY-3, [place] Service pager: [phone] (text pages welcome through [facility])

## 2015-12-05 NOTE — Consult Note (Addendum)
CARDIOLOGY CONSULT NOTE  Patient ID: Chad [name] MRN: [id number] DOB/AGE: 52-Jun-1962 52 y.o.  Admit date: 12/04/2015  Primary Cardiologist: [place] Reason for Consultation: Chest pain  HPI: Chad [name] is a 54 y.o. male with history of severe HTN, coronary artery disease status post previous myocardial infarction and bypass surgery in 2006 with DES to native PDA in 2011. He also has a history congestive heart failure secondary to ischemic cardiomyopathy He is s/p single chamber [employer] ICD. 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 He underwent emergent repair of proximal aorta and reimplantation of his CABG grafts. Carotids u/s in 2011 0-39%. 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 Also with significant low back pain s/p 2 surgeries.   ETT/Myoview in 9/12: Walked 8:46 on treadmill. Moderate-sized fixed inferior perfusion defect consistent with prior MI. EF 31% with septal hypokinesis. There was also inferior hypokinesis. Similar to prior study.   He was admitted in 2/16 with chest pain. Echo with EF 25%, diffuse hypokinesis, 4.5 cm aortic root, RV mildly dilated with mildly decreased systolic function. Cardiolite showed inferior infarction with mild peri-infarct ischemia, EF 27%.   Last echo in 10/16 showed EF 20-25% with inferior/inferolateral akinesis, mild to moderately decreased RV systolic function, mild MR and mild AI.   Patient was admitted last night after an episode of chest pain on a plane flight.  He was flying back from [place] and developed severe "ripping" central CP that lasted for about 15 minutes.  This was followed by a dull ache that lasted about 3 more hours.  He came to the ER here.  ECG non-acute.  Troponin so far negative.  Patient also has been using more Lasix recently, taking it every 3rd day or so.  Weight is up at home.  Feels like he is retaining fluid.  Dyspnea only with  heavy exertion.  Finally, he has been noticing more flutters/palpitations at home.  He had an episode of palpitations with lightheadedness yesterday while he was having chest pain.  He has been chest pain-free in hospital.   CTA chest/abdomen/pelvis done in ER showed patent ascending aorta graft and stable dissected aorta distal to graft.   Telemetry overnight showed 5 beat runs NSVT, occasional PVCs.   Review of systems complete and found to be negative unless listed above in HPI  Past Medical History  Diagnosis Date  . CAD (coronary artery disease)     a. s/p CABG 2006;  b. DES to PDA 2011 (cath: Dx not seen, dRCA/PDA tx with DES; S-PDA occluded ([place]), S-Dx occluded, S-RI and OM ok, L-LAD ok  . HTN (hypertension)     severe  . Chronic systolic heart failure (HCC)     a. 12/13 ECHO: EF 35-40%, sept, apical & posterobasal HK, LV mod dil & sys fx mod reduced, mild AI, MV mild reg, TV mild reg  . Aortic dissection, thoracoabdominal ([place])     7/10: Type I s/p repair  . CRI (chronic renal insufficiency)   . Thyroid cancer ([place])     Hertle Cell  . HLD (hyperlipidemia)   . Anemia   . Gout   . AICD (automatic cardioverter/defibrillator) present   . Carotid stenosis     dopplers 2011: 0-39% bilat.  . Chest pain syndrome   . CHF (congestive heart failure) (HCC)      Family History  Problem Relation Age of Onset  . Coronary artery disease    .  Hypertension Father   . Heart disease Father   . Early death Father   . COPD Father   . Hypertension Mother     Social History   Social History  . Marital Status: Divorced    Spouse Name: N/A  . Number of Children: N/A  . Years of Education: N/A   Occupational History  . disabled    Social History Main Topics  . Smoking status: Never Smoker   . Smokeless tobacco: Never Used  . Alcohol Use: No  . Drug Use: No  . Sexual Activity: Yes   Other Topics Concern  . Not on file   Social History Narrative   Engaged and lives with  fiancee and 4 kids.    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      Prescriptions prior to admission  Medication Sig Dispense Refill Last Dose  . amLODipine (NORVASC) 5 MG tablet Take 1 tablet (5 mg total) by mouth 2 (two) times daily. 60 tablet 6 12/03/2015 at Unknown time  . aspirin EC 81 MG tablet Take 1 tablet (81 mg total) by mouth daily. 90 tablet 3 12/03/2015 at Unknown time  . b complex vitamins tablet Take 2 tablets by mouth daily.    Past Week at Unknown time  . carvedilol (COREG) 25 MG tablet Take 1 tablet (25 mg total) by mouth 2 (two) times daily with a meal. 60 tablet 6 12/03/2015 at 1700  . clopidogrel (PLAVIX) 75 MG tablet Take 1 tablet (75 mg total) by mouth at bedtime. 30 tablet 6 12/03/2015 at Unknown time  . cyclobenzaprine (FLEXERIL) 10 MG tablet Take 10 mg by mouth daily as needed for muscle spasms.    unk  . diphenhydrAMINE (BENADRYL) 25 MG tablet Take 12.5 mg by mouth 2 (two) times daily as needed for itching or allergies.   Past Month at Unknown time  . ezetimibe (ZETIA) 10 MG tablet Take 1 tablet (10 mg total) by mouth daily. 30 tablet 6 12/03/2015 at Unknown time  . furosemide (LASIX) 40 MG tablet Take 1 tablet (40 mg total) by mouth every other day. Take daily for 3 days then take one tablet  every other day (Patient taking differently: Take 40 mg by mouth daily as needed for fluid or edema. ) 25 tablet 6 unk  . hydrALAZINE (APRESOLINE) 50 MG tablet Take 1.5 tablets (75 mg total) by mouth 3 (three) times daily. 135 tablet 6 12/04/2015 at Unknown time  . isosorbide mononitrate (IMDUR) 60 MG 24 hr tablet Take 1 tablet (60 mg total) by mouth 2 (two) times daily. 60 tablet 3 12/04/2015 at Unknown time  . Multiple Vitamin (MULTIVITAMIN WITH MINERALS) TABS tablet Take 1 tablet by mouth daily. Mega Men 50 Plus   Past Week at Unknown time  . nitroGLYCERIN (NITROSTAT) 0.4 MG SL tablet Place 1 tablet (0.4 mg total) under the tongue every 5 (five) minutes x 3 doses as needed for  chest pain. 30 tablet 12 12/04/2015 at Unknown time  . Oxycodone HCl 10 MG TABS Take 10 mg by mouth 4 (four) times daily.   12/04/2015 at Unknown time  . potassium chloride (K-DUR) 10 MEQ tablet Take 1 tablet (10 mEq total) by mouth daily. (Patient taking differently: Take 10-50 mEq by mouth daily as needed (cramps). ) 30 tablet 6 12/03/2015 at Unknown time  . pregabalin (LYRICA) 75 MG capsule Take 75 mg by mouth 2 (two) times daily.   12/04/2015 at Unknown time   Current Scheduled  Meds: . amLODipine  5 mg Oral BID  . aspirin EC  81 mg Oral Daily  . carvedilol  25 mg Oral BID WC  . clopidogrel  75 mg Oral QHS  . ezetimibe  10 mg Oral Daily  . furosemide  40 mg Intravenous BID  . heparin  5,000 Units Subcutaneous 3 times per day  . hydrALAZINE  75 mg Oral TID  . isosorbide mononitrate  60 mg Oral BID  . multivitamin with minerals  1 tablet Oral Daily  . potassium chloride  40 mEq Oral Once  . pregabalin  75 mg Oral BID   Continuous Infusions:  PRN Meds:.acetaminophen, cyclobenzaprine, diphenhydrAMINE, nitroGLYCERIN, ondansetron (ZOFRAN) IV, oxyCODONE  Physical exam Blood pressure 137/78, pulse 64, temperature 97.6 F (36.4 C), temperature source Oral, resp. rate 16, height 5\' 11"  (1.803 m), weight 180 lb 9.6 oz (81.92 kg), SpO2 98 %. General: NAD Neck: JVP 8-9 cm, no thyromegaly or thyroid nodule.  Lungs: Clear to auscultation bilaterally with normal respiratory effort. CV: Nondisplaced PMI.  Heart regular S1/S2, no S3/S4, 2/6 SEM RUSB.  No peripheral edema.  No carotid bruit.  Normal pedal pulses.  Abdomen: Soft, nontender, no hepatosplenomegaly, no distention.  Skin: Intact without lesions or rashes.  Neurologic: Alert and oriented x 3.  Psych: Normal affect. Extremities: No clubbing or cyanosis.  HEENT: Normal.   Labs:   Lab Results  Component Value Date   WBC 4.0 12/04/2015   HGB 14.1 12/04/2015   HCT 42.7 12/04/2015   MCV 83.4 12/04/2015   PLT 147* 12/04/2015      Recent Labs Lab 12/04/15 2238  NA 141  K 3.6  CL 107  CO2 27  BUN 16  CREATININE 1.39*  CALCIUM 9.0  GLUCOSE 91   Lab Results  Component Value Date   CKTOTAL 134 02/29/2012   CKMB 2.0 02/29/2012   TROPONINI <0.03 12/05/2015     Radiology: - CTA chest: patent ascending aorta graft, unchanged in appearance.  Dissection distal to graft stable in appearance.   EKG: NSR, PVC, LVH with repolarization  ASSESSMENT AND PLAN: 54 yo with history of CAD s/p CABG, ischemic cardiomyopathy EF 20-25%, history of Type I dissection s/p repair, CKD with solitary kidney s/p dissection presented with chest pain, palpitations, increased dyspnea.  1. CAD: Severe => dull chest pain lasting 3-4 hours yesterday on plan flight.  Currently CP-free.  Patient has history of CABG.  Last Cardiolite in 2/16 with prior inferior MI/minimal ischemia. Troponin so far negative. CTA chest/abd/pelvis showed no change in ascending aorta graft or the chronic dissection distal to the graft. - Continue to cycle troponin.  - Continue ASA 81, Plavix, and Zetia (cannot take statins). - Will arrange for Cardiolite in the am if troponin remains negative. Would like to avoid cath unless high risk => CKD/single functional kidney, contrast allergy.  2. Acute/chronic systolic CHF: Weight up at home, mild volume overload on exam.  He has been taking more Lasix at home.  - Lasix 40 mg IV bid today and reassess tomorrow.  - Continue hydralazine/Imdur and Coreg. - Not on ACEI with CKD - Add spironolactone 12.5 mg daily.  3. CKD: Single functional kidney s/p dissection.  Follow creatinine carefully, did get a contrast load last night.  4. Palpitations: 1 short run NSVT on telemetry.  Can Architect.  Continue Coreg.  Continue to follow on telemetry.  5. H/o ascending aorta dissection s/p graft repair: Stable on CT, no acute new  findings.   Loralie Champagne 12/05/2015 11:51 AM

## 2015-12-05 NOTE — Progress Notes (Signed)
Pt complaint of 7/10 chest pressure, VSS. 3 sublingual nitro's administered which brought pain to a 5/10 pressure.  Dr. Aundra Dubin on the floor and notified, orders received to give IV morphine.  Will continue to closely monitor.

## 2015-12-06 ENCOUNTER — Observation Stay (HOSPITAL_COMMUNITY): Payer: Medicaid Other

## 2015-12-06 ENCOUNTER — Other Ambulatory Visit (HOSPITAL_COMMUNITY): Payer: Medicaid Other

## 2015-12-06 DIAGNOSIS — I7103 Dissection of thoracoabdominal aorta: Secondary | ICD-10-CM | POA: Diagnosis not present

## 2015-12-06 DIAGNOSIS — I472 Ventricular tachycardia: Secondary | ICD-10-CM

## 2015-12-06 DIAGNOSIS — R079 Chest pain, unspecified: Secondary | ICD-10-CM

## 2015-12-06 DIAGNOSIS — I2510 Atherosclerotic heart disease of native coronary artery without angina pectoris: Secondary | ICD-10-CM | POA: Diagnosis not present

## 2015-12-06 DIAGNOSIS — I712 Thoracic aortic aneurysm, without rupture: Secondary | ICD-10-CM | POA: Diagnosis not present

## 2015-12-06 DIAGNOSIS — N183 Chronic kidney disease, stage 3 (moderate): Secondary | ICD-10-CM | POA: Diagnosis not present

## 2015-12-06 DIAGNOSIS — I10 Essential (primary) hypertension: Secondary | ICD-10-CM | POA: Diagnosis not present

## 2015-12-06 DIAGNOSIS — I5022 Chronic systolic (congestive) heart failure: Secondary | ICD-10-CM | POA: Diagnosis not present

## 2015-12-06 LAB — NM MYOCAR MULTI W/SPECT W/WALL MOTION / EF
CHL CUP RESTING HR STRESS: 67 {beats}/min
CSEPED: 7 min
CSEPEDS: 20 s
Estimated workload: 1 METS
Peak HR: 93 {beats}/min

## 2015-12-06 LAB — BASIC METABOLIC PANEL
Anion gap: 7 (ref 5–15)
BUN: 19 mg/dL (ref 6–20)
CHLORIDE: 106 mmol/L (ref 101–111)
CO2: 27 mmol/L (ref 22–32)
CREATININE: 1.55 mg/dL — AB (ref 0.61–1.24)
Calcium: 8.5 mg/dL — ABNORMAL LOW (ref 8.9–10.3)
GFR calc Af Amer: 58 mL/min — ABNORMAL LOW (ref >60)
GFR calc non Af Amer: 50 mL/min — ABNORMAL LOW (ref >60)
GLUCOSE: 109 mg/dL — AB (ref 65–99)
Potassium: 3.3 mmol/L — ABNORMAL LOW (ref 3.5–5.1)
Sodium: 140 mmol/L (ref 135–145)

## 2015-12-06 LAB — CBC
HCT: 41.3 % (ref 39.0–52.0)
Hemoglobin: 13.2 g/dL (ref 13.0–17.0)
MCH: 27 pg (ref 26.0–34.0)
MCHC: 32 g/dL (ref 30.0–36.0)
MCV: 84.5 fL (ref 78.0–100.0)
PLATELETS: 132 10*3/uL — AB (ref 150–400)
RBC: 4.89 MIL/uL (ref 4.22–5.81)
RDW: 14.4 % (ref 11.5–15.5)
WBC: 6.7 10*3/uL (ref 4.0–10.5)

## 2015-12-06 LAB — TROPONIN I: TROPONIN I: 0.03 ng/mL (ref <0.031)

## 2015-12-06 MED ORDER — REGADENOSON 0.4 MG/5ML IV SOLN
0.4000 mg | Freq: Once | INTRAVENOUS | Status: AC
  Administered 2015-12-06: 0.4 mg via INTRAVENOUS
  Filled 2015-12-06: qty 5

## 2015-12-06 MED ORDER — TECHNETIUM TC 99M SESTAMIBI GENERIC - CARDIOLITE
10.0000 | Freq: Once | INTRAVENOUS | Status: AC | PRN
  Administered 2015-12-06: 10 via INTRAVENOUS

## 2015-12-06 MED ORDER — REGADENOSON 0.4 MG/5ML IV SOLN
INTRAVENOUS | Status: AC
  Administered 2015-12-06: 0.4 mg via INTRAVENOUS
  Filled 2015-12-06: qty 5

## 2015-12-06 MED ORDER — TECHNETIUM TC 99M SESTAMIBI GENERIC - CARDIOLITE
30.0000 | Freq: Once | INTRAVENOUS | Status: AC | PRN
  Administered 2015-12-06: 30 via INTRAVENOUS

## 2015-12-06 MED ORDER — NITROGLYCERIN 0.4 MG SL SUBL
0.4000 mg | SUBLINGUAL_TABLET | Freq: Once | SUBLINGUAL | Status: AC
  Administered 2015-12-06: 0.4 mg via SUBLINGUAL

## 2015-12-06 MED ORDER — NITROGLYCERIN 0.4 MG SL SUBL
SUBLINGUAL_TABLET | SUBLINGUAL | Status: AC
  Filled 2015-12-06: qty 1

## 2015-12-06 NOTE — Progress Notes (Signed)
Patient presented for Lexiscan. Tolerated procedure well. Result to follow. [place] to read.  Patient had 7/10 left upper chest pain after given lexiscan that he described as "fluid moving through his heart" that reduced to 4/10 after 7 mins which he described as "sqeezing". Given Sl nitro x 1. Pain resolved.   Emelina Hinch, [place]

## 2015-12-06 NOTE — Progress Notes (Signed)
Family Medicine Teaching Service Daily Progress Note Intern Pager: [phone]  Patient name: Chad [name] Medical record number: [id number] Date of birth: Jul 04, 1961 Age: 54 y.o. Gender: male  Primary Care Provider: [name], MD Consultants: Cardiology Code Status: Full   Pt Overview and Major Events to Date:   Assessment and Plan: Chad [name] is a 54 y.o. male presenting with chest pain . PMH is significant for systolic heart failure s/p pacemaker, hx type I aortic dissection s/p repair (2010), CKD, HTN, and CAD s/p CABG (2006).  Chest pain: One episode of "ripping" pain occurring during flight from [employer] to [place]. Given patient's history and description pain, concern for aortic dissection. However, chest CT showed no dissection, but some dilation of lower abdominal aorta (previously 2.8cm, now 3.2cm). ED EKG showed sinus rhythm with occasional PVCs. Troponin negx3. Chest pain improved after morphine in ED. Had chest pain after NST today, which resolved with nitro. - Continue telemetry - Follow-up repeat EKG - Cardiology consulted, appreciate recommendations: NST and pacer interrogation 12/12 (awaiting results) - Will need outpatient f/u of AAA with repeat imaging - Nitroglycerin PRN - Continue Plavix - Cont ASA 81 mg  HTN: BPs 130s/70s in ED. - Continue Norvasc, Coreg, hydralazine, Lasix - Consider switch from hydralazine to lisinopril in outpatient setting  CKD: Cr 1.39 on admission, which appears to be slightly lower than patient's baseline of ~1.5 (from past year).  - Monitor Cr, 1.55 12/06/15  Chronic pain/sciatica: - Continue home oxycodone, Flexeril, Lyrica  CAD: s/p CABG - Continue Imdur, coreg, zetia  FEN/GI: Heart healthy diet Prophylaxis: subQ heparin  Disposition: place in observation with telemetry  Subjective:  - Patient only complains of sciatica pain (chronic). - Has not had ripping chest pain since initially felt while on airplane.    Objective: Temp:  [98.5 F (36.9 C)-98.6 F (37 C)] 98.5 F (36.9 C) (12/12 0432) Pulse Rate:  [73-78] 73 (12/12 0432) Resp:  [18] 18 (12/11 1445) BP: (116-136)/(70-79) 120/70 mmHg (12/12 0432) SpO2:  [93 %-97 %] 93 % (12/12 0432) Weight:  [182 lb (82.555 kg)] 182 lb (82.555 kg) (12/12 0432) Physical Exam: General: Well appearing male, resting in bed Cardiovascular: RRR, S1, S2, 3/5 systolic murmur appreciated over right sternal border Respiratory: CTAB, no increased WOB Abdomen: TTP over umbilical area and RLQ. 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   Rogue Bussing, MD 12/06/2015, 8:18 AM PGY-1, [place] Intern pager: [phone], text pages welcome

## 2015-12-06 NOTE — Progress Notes (Signed)
Per [employer]   No events stored. Last NSVT 11/14/15 lasted 2 seconds, about 8 beats. Lowest zone is 185 bpm, so anything lower than that would not be stored.   Device functioning well.   Legrand Como [id number]" [place], PA-C 12/06/2015 12:47 PM   Bensimhon, Daniel,MD 3:54 PM

## 2015-12-06 NOTE — Progress Notes (Signed)
The patient's Myoview came back as: 1. Evidence for reversibility and ischemia involving the anterolateral wall and a portion of the lateral wall.  2. Large infarct involving the inferior wall. There is dyskinesia in the inferior wall.  3. Left ventricular ejection fraction is 32%. Left ventricle dilatation.  4. High-risk stress test findings*.   Case discussed with Dr. Claiborne Billings. High risk could be from low EF. Will keep him NPO for possible cath tomorrow. CHF team will see in morning. Current patient is asymptotic. Trop x 3 negative. Start IV heparin overnight if CP. For now continue current regimen.   Marae Cottrell, [place]

## 2015-12-06 NOTE — Progress Notes (Addendum)
Advanced Heart Failure Rounding Note  Primary HF: Dr. Haroldine Laws  Reason for consult: CP  Subjective:    [place].  Did have CP during [place] that got better with Nitro.  Also had to take 2 nitros last night.  Denies SOB. He does not think he has extra fluid.  Says he is not peeing very much. He is not currently having CP.   Weight up ~ 1.5 lbs despite 40 mg IV lasix BID. Creatinine up slightly. 1.39 => 1.55. Although baseline appears to be 1.3-1.6  Objective:   Weight Range: 182 lb (82.555 kg) Body mass index is 25.4 kg/(m^2).   Vital Signs:   Temp:  [98.5 F (36.9 C)-98.6 F (37 C)] 98.5 F (36.9 C) (12/12 0432) Pulse Rate:  [73-78] 73 (12/12 0432) Resp:  [18] 18 (12/11 1445) BP: (116-136)/(70-79) 120/70 mmHg (12/12 0432) SpO2:  [93 %-97 %] 93 % (12/12 0432) Weight:  [182 lb (82.555 kg)] 182 lb (82.555 kg) (12/12 0432)    Weight change: Filed Weights   12/04/15 2231 12/05/15 0458 12/06/15 0432  Weight: 176 lb (79.833 kg) 180 lb 9.6 oz (81.92 kg) 182 lb (82.555 kg)    Intake/Output:   Intake/Output Summary (Last 24 hours) at 12/06/15 0914 Last data filed at 12/06/15 [id number]  Gross per 24 hour  Intake   1410 ml  Output   2180 ml  Net   -770 ml     Physical Exam: General:  Well appearing. No resp difficulty HEENT: normal Neck: supple. JVP 7-8. Carotids 2+ bilat; no bruits. No lymphadenopathy or thyromegaly appreciated. Cor: PMI nondisplaced. Regular rate & rhythm. 2/6 SEM RUSB.  Lungs: CTAB, normal effort Abdomen: soft, NT, ND, no HSM. No bruits or masses. +BS Extremities: no cyanosis, clubbing, rash, edema Neuro: alert & orientedx3, cranial nerves grossly intact. moves all 4 extremities w/o difficulty. Affect flat  Telemetry: NSR 60-70s with PVCs  Labs: CBC  Recent Labs  12/04/15 2238 12/06/15 0438  WBC 4.0 6.7  HGB 14.1 13.2  HCT 42.7 41.3  MCV 83.4 84.5  PLT 147* [id number]*   Basic Metabolic Panel  Recent Labs  12/04/15 2238 12/06/15 0438  NA 141  140  K 3.6 3.3*  CL 107 106  CO2 27 27  GLUCOSE 91 109*  BUN 16 19  CREATININE 1.39* 1.55*  CALCIUM 9.0 8.5*   Liver Function Tests No results for input(s): AST, ALT, ALKPHOS, BILITOT, PROT, ALBUMIN in the last 72 hours. No results for input(s): LIPASE, AMYLASE in the last 72 hours. Cardiac Enzymes  Recent Labs  12/05/15 1223 12/05/15 1706 12/05/15 2320  TROPONINI <0.03 <0.03 0.03    BNP: BNP (last 3 results) No results for input(s): BNP in the last 8760 hours.  ProBNP (last 3 results) No results for input(s): PROBNP in the last 8760 hours.   D-Dimer No results for input(s): DDIMER in the last 72 hours. Hemoglobin A1C No results for input(s): HGBA1C in the last 72 hours. Fasting Lipid Panel No results for input(s): CHOL, HDL, LDLCALC, TRIG, CHOLHDL, LDLDIRECT in the last 72 hours. Thyroid Function Tests No results for input(s): TSH, T4TOTAL, T3FREE, THYROIDAB in the last 72 hours.  Invalid input(s): FREET3  Other results:     Imaging/Studies:  Dg Chest 2 View  12/04/2015  CLINICAL DATA:  Onset of chest pain while on a flight. EXAM: CHEST  2 VIEW COMPARISON:  Radiographs 10/04/2015, CT 10/11/2014 FINDINGS: Stable cardiomegaly and mediastinal contours, patient is post repair of the thoracic aortic dissection.  Post median sternotomy with single lead left-sided pacemaker in place. No pulmonary edema, confluent airspace disease, pleural effusion or pneumothorax. No acute osseous abnormalities are seen. IMPRESSION: No acute process.  Stable cardiomegaly and mediastinal contours. Electronically Signed   By: Jeb Levering M.D.   On: 12/04/2015 23:21   Ct Angio Chest Aorta W/cm &/or Wo/cm  12/05/2015  CLINICAL DATA:  Chest pain. History of aortic dissection post repair 6 years prior. EXAM: CT ANGIOGRAPHY CHEST, ABDOMEN AND PELVIS TECHNIQUE: Multidetector CT imaging through the chest, abdomen and pelvis was performed using the standard protocol during bolus  administration of intravenous contrast. Multiplanar reconstructed images and MIPs were obtained and reviewed to evaluate the vascular anatomy. CONTRAST:  100 mL Omnipaque 350 IV COMPARISON:  Most recent CTA 10/11/2014.  CT abdomen 03/21/2015 FINDINGS: CTA CHEST FINDINGS Aortic stent graft involving the ascending thoracic aorta which is patent and unchanged in appearance from prior exam. The aortic root measures 4.1 cm, previously 4.3 cm. Is cardiac motion through the aortic root. Again noted is aortic dissection at the aortic arch just be on the surgical graft. Overall appearance of the dissection is unchanged from prior exam. There is no extension of dissection to the great vessels from the aortic arch. The proximal descending thoracic aorta measures 4.4 cm, previously 4.5 cm. Dissection extends through the descending thoracic aorta with opacification of the false and true lumen. Overall size and configuration is unchanged. There is no aortic hematoma or surrounding soft tissue stranding. Multi chamber cardiomegaly with pacemaker, unchanged in appearance. No adenopathy. Patulous esophagus. No filling defects in the central pulmonary arteries. No pleural or pericardial effusion. Mild dependent atelectasis in the lower lungs. No consolidation to suggest pneumonia. Absent left thyroid tissue. There are no acute or suspicious osseous abnormalities. Patient is post median sternotomy. Review of the MIP images confirms the above findings. CTA ABDOMEN AND PELVIS FINDINGS Aortic dissection involves the abdominal aorta through the common iliac arteries bilaterally. Dissection extends to the celiac trunk. Dissection involves the proximal SMA. IMA arises from the true lumen. The abdominal aorta at the level of the celiac trunk measures 4 cm, unchanged. Distal abdominal aorta measures 3.2 cm, previously 2.8 cm. Dominant SMA branches are patent. Right renal artery originates from the true lumen. Left renal artery originates from  the true lumen. Dissection does not involve the internal or external iliac arteries or femoral arteries. Arterial phase imaging of the liver, adrenal glands, and pancreas are unremarkable. Stones or sludge in the gallbladder, physiologic gallbladder distention. Small cyst in the spleen, not definitively seen prior, however likely incidental. Chronic left renal atrophy with only minimal enhancement of the lower pole. Cortical scarring in the lower right kidney, unchanged. No right hydronephrosis. Stomach is decompressed. There are no dilated or thickened bowel loops. Moderate stool burden. Diverticulosis of the sigmoid colon without diverticulitis. Appendix tentatively identified. Resolution of previous pelvic free fluid. Fluid remains in both inguinal canals. IVC is patent.  No retroperitoneal adenopathy. Urinary bladder is decompressed, however thick walled. Prostate gland appears prominent. Advanced degenerative change in the lower lumbar spine. There are no acute or suspicious osseous abnormalities. Review of the MIP images confirms the above findings. IMPRESSION: 1. Post ascending aortic graft which is patent and unchanged in configuration from prior exam. Configuration of aortic dissection distal to the aortic graft extending from the arch through the common iliac arteries is unchanged from prior. The size of the lower abdominal aorta has progressed from prior exam, currently 3.2 cm, previously  2.8 cm. No surrounding soft tissue stranding. 2. Thick-walled urinary bladder, recommend correlation with urinalysis to exclude urinary tract infection. This appears greater than expected allowing for degree of distension. 3. Chronic findings as described. Electronically Signed   By: Jeb Levering M.D.   On: 12/05/2015 02:12   Ct Cta Abd/pel W/cm &/or W/o Cm  12/05/2015  CLINICAL DATA:  Chest pain. History of aortic dissection post repair 6 years prior. EXAM: CT ANGIOGRAPHY CHEST, ABDOMEN AND PELVIS TECHNIQUE:  Multidetector CT imaging through the chest, abdomen and pelvis was performed using the standard protocol during bolus administration of intravenous contrast. Multiplanar reconstructed images and MIPs were obtained and reviewed to evaluate the vascular anatomy. CONTRAST:  100 mL Omnipaque 350 IV COMPARISON:  Most recent CTA 10/11/2014.  CT abdomen 03/21/2015 FINDINGS: CTA CHEST FINDINGS Aortic stent graft involving the ascending thoracic aorta which is patent and unchanged in appearance from prior exam. The aortic root measures 4.1 cm, previously 4.3 cm. Is cardiac motion through the aortic root. Again noted is aortic dissection at the aortic arch just be on the surgical graft. Overall appearance of the dissection is unchanged from prior exam. There is no extension of dissection to the great vessels from the aortic arch. The proximal descending thoracic aorta measures 4.4 cm, previously 4.5 cm. Dissection extends through the descending thoracic aorta with opacification of the false and true lumen. Overall size and configuration is unchanged. There is no aortic hematoma or surrounding soft tissue stranding. Multi chamber cardiomegaly with pacemaker, unchanged in appearance. No adenopathy. Patulous esophagus. No filling defects in the central pulmonary arteries. No pleural or pericardial effusion. Mild dependent atelectasis in the lower lungs. No consolidation to suggest pneumonia. Absent left thyroid tissue. There are no acute or suspicious osseous abnormalities. Patient is post median sternotomy. Review of the MIP images confirms the above findings. CTA ABDOMEN AND PELVIS FINDINGS Aortic dissection involves the abdominal aorta through the common iliac arteries bilaterally. Dissection extends to the celiac trunk. Dissection involves the proximal SMA. IMA arises from the true lumen. The abdominal aorta at the level of the celiac trunk measures 4 cm, unchanged. Distal abdominal aorta measures 3.2 cm, previously 2.8 cm.  Dominant SMA branches are patent. Right renal artery originates from the true lumen. Left renal artery originates from the true lumen. Dissection does not involve the internal or external iliac arteries or femoral arteries. Arterial phase imaging of the liver, adrenal glands, and pancreas are unremarkable. Stones or sludge in the gallbladder, physiologic gallbladder distention. Small cyst in the spleen, not definitively seen prior, however likely incidental. Chronic left renal atrophy with only minimal enhancement of the lower pole. Cortical scarring in the lower right kidney, unchanged. No right hydronephrosis. Stomach is decompressed. There are no dilated or thickened bowel loops. Moderate stool burden. Diverticulosis of the sigmoid colon without diverticulitis. Appendix tentatively identified. Resolution of previous pelvic free fluid. Fluid remains in both inguinal canals. IVC is patent.  No retroperitoneal adenopathy. Urinary bladder is decompressed, however thick walled. Prostate gland appears prominent. Advanced degenerative change in the lower lumbar spine. There are no acute or suspicious osseous abnormalities. Review of the MIP images confirms the above findings. IMPRESSION: 1. Post ascending aortic graft which is patent and unchanged in configuration from prior exam. Configuration of aortic dissection distal to the aortic graft extending from the arch through the common iliac arteries is unchanged from prior. The size of the lower abdominal aorta has progressed from prior exam, currently 3.2 cm, previously 2.8  cm. No surrounding soft tissue stranding. 2. Thick-walled urinary bladder, recommend correlation with urinalysis to exclude urinary tract infection. This appears greater than expected allowing for degree of distension. 3. Chronic findings as described. Electronically Signed   By: Jeb Levering M.D.   On: 12/05/2015 02:12     Latest Echo  Latest Cath   Medications:     Scheduled  Medications: . amLODipine  5 mg Oral BID  . aspirin EC  81 mg Oral Daily  . carvedilol  25 mg Oral BID WC  . clopidogrel  75 mg Oral QHS  . ezetimibe  10 mg Oral Daily  . furosemide  40 mg Intravenous BID  . heparin  5,000 Units Subcutaneous 3 times per day  . hydrALAZINE  75 mg Oral TID  . isosorbide mononitrate  60 mg Oral BID  . multivitamin with minerals  1 tablet Oral Daily  . pregabalin  75 mg Oral BID  . spironolactone  12.5 mg Oral Daily     Infusions:     PRN Medications:  acetaminophen, cyclobenzaprine, diphenhydrAMINE, nitroGLYCERIN, ondansetron (ZOFRAN) IV, oxyCODONE   Assessment   54 yo with history of CAD s/p CABG, ischemic cardiomyopathy EF 20-25%, history of Type I dissection s/p repair, CKD with solitary kidney s/p dissection presented with chest pain, palpitations, increased dyspnea.   1. CAD s/p CABG. 2. Acute/chronic combined CHF, ECHO 10/22/15 EF 20-25%, grade 1 DD, RV mild/mod reduced. Mild AI/MR. 3. CKD 4. Palpitations 5. H/o ascending aorta dissection s/p graft repair   Plan   Weight up 2 lbs despite IV lasix 40 mg BID. Had been taking more at home recently. He does not appear markedly volume overloaded on exam. Will hold lasix this pm and consider transition to po pending BMET in am. Especially with Cr up and possibility of needing cath if cardiolite high risk.   Had repeat cardiolite lexiscan this am. Patient had 7/10 left upper chest pain after given lexiscan that he described as "fluid moving through his heart" that reduced to 4/10 after 7 mins which he described as "sqeezing". Given Sl nitro x 1. Pain resolved.   Results pending.   CTA chest/abd/pelvis showed no change in ascending aorta graft or the chronic dissection distal to the graft. Troponins negative.  Please see separate progress note for [employer]. No events. Last NSVT Nov 20.  Length of Stay:    Shirley Friar PA-C 12/06/2015, 9:14 AM  Advanced Heart  Failure Team Pager [phone] (M-F; 7a - 4p)  Please contact [facility] for night-coverage after hours (4p -7a ) and weekends on [email or web address]  Patient seen and examined with Oda Kilts, PA-C. We discussed all aspects of the encounter. I agree with the assessment and plan as stated above.   CP now resolved. I have reviewed Myoview images personally. EF 32%. Large fixed defect (preveious MI) with small reversible defect in the distal anterolateral/lateral walls. This is unchanged from previous. He does not have any exertional angina. CP occurred after he ran out of meds. Given that he is high risk for re-cath would treat medically.   Volume status looks fine. Can d/c either later today or in am and f/u with me in HF Clinic.   NSVT on tele. Supp K+. ICD in place.   Appreciate [facility] care.  I will sign off. Please call with questions.   Bensimhon, Daniel,MD 3:23 PM

## 2015-12-06 NOTE — Progress Notes (Signed)
Paged PA on call for CHF about high risk results of patient's stress test.

## 2015-12-07 DIAGNOSIS — N183 Chronic kidney disease, stage 3 (moderate): Secondary | ICD-10-CM | POA: Diagnosis not present

## 2015-12-07 DIAGNOSIS — R072 Precordial pain: Secondary | ICD-10-CM

## 2015-12-07 DIAGNOSIS — R079 Chest pain, unspecified: Secondary | ICD-10-CM | POA: Insufficient documentation

## 2015-12-07 DIAGNOSIS — I10 Essential (primary) hypertension: Secondary | ICD-10-CM | POA: Diagnosis not present

## 2015-12-07 DIAGNOSIS — I5022 Chronic systolic (congestive) heart failure: Secondary | ICD-10-CM | POA: Diagnosis not present

## 2015-12-07 LAB — BASIC METABOLIC PANEL
ANION GAP: 8 (ref 5–15)
BUN: 15 mg/dL (ref 6–20)
CHLORIDE: 101 mmol/L (ref 101–111)
CO2: 31 mmol/L (ref 22–32)
Calcium: 8.8 mg/dL — ABNORMAL LOW (ref 8.9–10.3)
Creatinine, Ser: 1.37 mg/dL — ABNORMAL HIGH (ref 0.61–1.24)
GFR, EST NON AFRICAN AMERICAN: 58 mL/min — AB (ref >60)
Glucose, Bld: 97 mg/dL (ref 65–99)
POTASSIUM: 3.6 mmol/L (ref 3.5–5.1)
SODIUM: 140 mmol/L (ref 135–145)

## 2015-12-07 MED ORDER — ISOSORBIDE MONONITRATE ER 60 MG PO TB24: 60.0000 mg | ORAL_TABLET | Freq: Two times a day (BID) | ORAL | Status: DC

## 2015-12-07 MED ORDER — IOHEXOL 350 MG/ML SOLN
100.0000 mL | Freq: Once | INTRAVENOUS | Status: AC | PRN
  Administered 2015-12-05: 100 mL via INTRAVENOUS

## 2015-12-07 MED ORDER — SPIRONOLACTONE 25 MG PO TABS: 12.5000 mg | ORAL_TABLET | Freq: Every day | ORAL | Status: DC

## 2015-12-07 NOTE — Progress Notes (Signed)
Patient was not kept NPO last night because he said heart failure team did not advise cath today. Patient had breakfast.

## 2015-12-07 NOTE — Progress Notes (Signed)
Family Medicine Teaching Service Daily Progress Note Intern Pager: [phone]  Patient name: Chad [name] Medical record number: [id number] Date of birth: 02/11/1961 Age: 54 y.o. Gender: male  Primary Care Provider: [name], MD Consultants: Cardiology Code Status: Full   Pt Overview and Major Events to Date:   Assessment and Plan: Chad [name] is a 54 y.o. male presenting with chest pain . PMH is significant for systolic heart failure s/p pacemaker, hx type I aortic dissection s/p repair (2010), CKD, HTN, and CAD s/p CABG (2006).  Chest pain: One episode of "ripping" pain occurring during flight from [facility] to [place]. Given patient's history and description pain, concern for aortic dissection. However, chest CT showed no dissection, but some dilation of lower abdominal aorta (previously 2.8cm, now 3.2cm). ED EKG showed sinus rhythm with occasional PVCs. Troponin negx3. Chest pain improved after morphine in ED. Had chest pain after NST today, which resolved with nitro. - Continue telemetry - Follow-up repeat EKG - Cardiology consulted, appreciate recommendations -high risk stress test findings; was supposed to have possible cath 12/13 and be kept NPO but patient ate breakfast this morning  -[employer] Interrogation: no events stored - Will need outpatient f/u of AAA with repeat imaging - Nitroglycerin PRN - Continue Plavix - Cont ASA 81 mg  HTN: Stable  - Continue Norvasc, Coreg, hydralazine, Lasix - Consider switch from hydralazine to lisinopril in outpatient setting  CKD: Cr 1.39 on admission, which appears to be slightly lower than patient's baseline of ~1.5 (from past year).  - Monitor Cr, 1.55 12/06/15; labs pending from 12/13  Chronic pain/sciatica: - Continue home oxycodone, Flexeril, Lyrica  CAD: s/p CABG - Continue Imdur, coreg, zetia  FEN/GI: Heart healthy diet Prophylaxis: subQ heparin  Disposition: place in observation with  telemetry  Subjective:  Had chest pain yesterday with Myoview. Reports that chest pain resolved and has not had any pain overnight/this morning. Denies dyspnea. No complaints.   Objective: Temp:  [97.9 F (36.6 C)-98.7 F (37.1 C)] 98.5 F (36.9 C) (12/13 0500) Pulse Rate:  [69-74] 74 (12/13 0500) Resp:  [17-18] 18 (12/12 1948) BP: (111-142)/(64-85) 131/83 mmHg (12/13 0654) SpO2:  [97 %-98 %] 97 % (12/13 0500) Weight:  [178 lb 9.6 oz (81.012 kg)] 178 lb 9.6 oz (81.012 kg) (12/13 0500) Physical Exam: General: Well appearing male, resting in bed Cardiovascular: RRR, S1, S2, 3/5 systolic murmur appreciated over right sternal border Respiratory: CTAB, no increased WOB Extremities: FROM, no LE edema   Laboratory:  Recent Labs Lab 12/04/15 2238 12/06/15 0438  WBC 4.0 6.7  HGB 14.1 13.2  HCT 42.7 41.3  PLT 147* 132*    Recent Labs Lab 12/04/15 2238 12/06/15 0438  NA 141 140  K 3.6 3.3*  CL 107 106  CO2 27 27  BUN 16 19  CREATININE 1.39* 1.55*  CALCIUM 9.0 8.5*  GLUCOSE 91 109*   Imaging/Diagnostic Tests: Dg Chest 2 View  12/04/2015  CLINICAL DATA:  Onset of chest pain while on a flight. EXAM: CHEST  2 VIEW COMPARISON:  Radiographs 10/04/2015, CT 10/11/2014 FINDINGS: Stable cardiomegaly and mediastinal contours, patient is post repair of the thoracic aortic dissection. Post median sternotomy with single lead left-sided pacemaker in place. No pulmonary edema, confluent airspace disease, pleural effusion or pneumothorax. No acute osseous abnormalities are seen. IMPRESSION: No acute process.  Stable cardiomegaly and mediastinal contours. Electronically Signed   By: Jeb Levering M.D.   On: 12/04/2015 23:21   Nm Myocar Multi W/spect W/wall Motion /  Ef  12/06/2015  CLINICAL DATA:  54 year old with history of coronary artery disease, post CABG and chest pain. EXAM: MYOCARDIAL IMAGING WITH SPECT (REST AND PHARMACOLOGIC-STRESS) GATED LEFT VENTRICULAR WALL MOTION STUDY LEFT  VENTRICULAR EJECTION FRACTION TECHNIQUE: Standard myocardial SPECT imaging was performed after resting intravenous injection of 10 mCi Tc-27m sestamibi. Subsequently, intravenous infusion of Lexiscan was performed under the supervision of the Cardiology staff. At peak effect of the drug, 01/26/2015 mCi Tc-26m sestamibi was injected intravenously and standard myocardial SPECT imaging was performed. Quantitative gated imaging was also performed to evaluate left ventricular wall motion, and estimate left ventricular ejection fraction. COMPARISON:  None. FINDINGS: Perfusion: Again noted is a large infarct involving the inferior wall. There is mild reversibility involving the anterolateral wall and a small portion of the lateral wall near the apex and mid segments. Findings are suggestive for ischemia. Wall Motion: There is diffuse severe hypokinesia in the left ventricle. There is also dyskinesia involving the inferior wall compatible with the large infarct. Left Ventricular Ejection Fraction: [id number] End diastolic volume [id number] ml End systolic volume 0000000 ml IMPRESSION: 1. Evidence for reversibility and ischemia involving the anterolateral wall and a portion of the lateral wall. 2. Large infarct involving the inferior wall. There is dyskinesia in the inferior wall. 3. Left ventricular ejection fraction is 32%. Left ventricle dilatation. 4. High-risk stress test findings*. *2012 Appropriate Use Criteria for Coronary Revascularization Focused Update: J Am Coll Cardiol. [id number]. http://content.airportbarriers.com.aspx?articleid=[email or web address] These results will be called to the ordering clinician or representative by the Radiologist Assistant, and communication documented in the PACS or zVision Dashboard. Electronically Signed   By: Markus Daft M.D.   On: 12/06/2015 16:42   Ct Angio Chest Aorta W/cm &/or Wo/cm  12/05/2015  CLINICAL DATA:  Chest pain. History of aortic dissection post repair 6 years prior. EXAM: CT  ANGIOGRAPHY CHEST, ABDOMEN AND PELVIS TECHNIQUE: Multidetector CT imaging through the chest, abdomen and pelvis was performed using the standard protocol during bolus administration of intravenous contrast. Multiplanar reconstructed images and MIPs were obtained and reviewed to evaluate the vascular anatomy. CONTRAST:  100 mL Omnipaque 350 IV COMPARISON:  Most recent CTA 10/11/2014.  CT abdomen 03/21/2015 FINDINGS: CTA CHEST FINDINGS Aortic stent graft involving the ascending thoracic aorta which is patent and unchanged in appearance from prior exam. The aortic root measures 4.1 cm, previously 4.3 cm. Is cardiac motion through the aortic root. Again noted is aortic dissection at the aortic arch just be on the surgical graft. Overall appearance of the dissection is unchanged from prior exam. There is no extension of dissection to the great vessels from the aortic arch. The proximal descending thoracic aorta measures 4.4 cm, previously 4.5 cm. Dissection extends through the descending thoracic aorta with opacification of the false and true lumen. Overall size and configuration is unchanged. There is no aortic hematoma or surrounding soft tissue stranding. Multi chamber cardiomegaly with pacemaker, unchanged in appearance. No adenopathy. Patulous esophagus. No filling defects in the central pulmonary arteries. No pleural or pericardial effusion. Mild dependent atelectasis in the lower lungs. No consolidation to suggest pneumonia. Absent left thyroid tissue. There are no acute or suspicious osseous abnormalities. Patient is post median sternotomy. Review of the MIP images confirms the above findings. CTA ABDOMEN AND PELVIS FINDINGS Aortic dissection involves the abdominal aorta through the common iliac arteries bilaterally. Dissection extends to the celiac trunk. Dissection involves the proximal SMA. IMA arises from the true lumen. The abdominal aorta at the level of the celiac trunk  measures 4 cm, unchanged. Distal  abdominal aorta measures 3.2 cm, previously 2.8 cm. Dominant SMA branches are patent. Right renal artery originates from the true lumen. Left renal artery originates from the true lumen. Dissection does not involve the internal or external iliac arteries or femoral arteries. Arterial phase imaging of the liver, adrenal glands, and pancreas are unremarkable. Stones or sludge in the gallbladder, physiologic gallbladder distention. Small cyst in the spleen, not definitively seen prior, however likely incidental. Chronic left renal atrophy with only minimal enhancement of the lower pole. Cortical scarring in the lower right kidney, unchanged. No right hydronephrosis. Stomach is decompressed. There are no dilated or thickened bowel loops. Moderate stool burden. Diverticulosis of the sigmoid colon without diverticulitis. Appendix tentatively identified. Resolution of previous pelvic free fluid. Fluid remains in both inguinal canals. IVC is patent.  No retroperitoneal adenopathy. Urinary bladder is decompressed, however thick walled. Prostate gland appears prominent. Advanced degenerative change in the lower lumbar spine. There are no acute or suspicious osseous abnormalities. Review of the MIP images confirms the above findings. IMPRESSION: 1. Post ascending aortic graft which is patent and unchanged in configuration from prior exam. Configuration of aortic dissection distal to the aortic graft extending from the arch through the common iliac arteries is unchanged from prior. The size of the lower abdominal aorta has progressed from prior exam, currently 3.2 cm, previously 2.8 cm. No surrounding soft tissue stranding. 2. Thick-walled urinary bladder, recommend correlation with urinalysis to exclude urinary tract infection. This appears greater than expected allowing for degree of distension. 3. Chronic findings as described. Electronically Signed   By: Jeb Levering M.D.   On: 12/05/2015 02:12   Ct Cta Abd/pel W/cm  &/or W/o Cm  12/05/2015  CLINICAL DATA:  Chest pain. History of aortic dissection post repair 6 years prior. EXAM: CT ANGIOGRAPHY CHEST, ABDOMEN AND PELVIS TECHNIQUE: Multidetector CT imaging through the chest, abdomen and pelvis was performed using the standard protocol during bolus administration of intravenous contrast. Multiplanar reconstructed images and MIPs were obtained and reviewed to evaluate the vascular anatomy. CONTRAST:  100 mL Omnipaque 350 IV COMPARISON:  Most recent CTA 10/11/2014.  CT abdomen 03/21/2015 FINDINGS: CTA CHEST FINDINGS Aortic stent graft involving the ascending thoracic aorta which is patent and unchanged in appearance from prior exam. The aortic root measures 4.1 cm, previously 4.3 cm. Is cardiac motion through the aortic root. Again noted is aortic dissection at the aortic arch just be on the surgical graft. Overall appearance of the dissection is unchanged from prior exam. There is no extension of dissection to the great vessels from the aortic arch. The proximal descending thoracic aorta measures 4.4 cm, previously 4.5 cm. Dissection extends through the descending thoracic aorta with opacification of the false and true lumen. Overall size and configuration is unchanged. There is no aortic hematoma or surrounding soft tissue stranding. Multi chamber cardiomegaly with pacemaker, unchanged in appearance. No adenopathy. Patulous esophagus. No filling defects in the central pulmonary arteries. No pleural or pericardial effusion. Mild dependent atelectasis in the lower lungs. No consolidation to suggest pneumonia. Absent left thyroid tissue. There are no acute or suspicious osseous abnormalities. Patient is post median sternotomy. Review of the MIP images confirms the above findings. CTA ABDOMEN AND PELVIS FINDINGS Aortic dissection involves the abdominal aorta through the common iliac arteries bilaterally. Dissection extends to the celiac trunk. Dissection involves the proximal SMA.  IMA arises from the true lumen. The abdominal aorta at the level of the celiac trunk  measures 4 cm, unchanged. Distal abdominal aorta measures 3.2 cm, previously 2.8 cm. Dominant SMA branches are patent. Right renal artery originates from the true lumen. Left renal artery originates from the true lumen. Dissection does not involve the internal or external iliac arteries or femoral arteries. Arterial phase imaging of the liver, adrenal glands, and pancreas are unremarkable. Stones or sludge in the gallbladder, physiologic gallbladder distention. Small cyst in the spleen, not definitively seen prior, however likely incidental. Chronic left renal atrophy with only minimal enhancement of the lower pole. Cortical scarring in the lower right kidney, unchanged. No right hydronephrosis. Stomach is decompressed. There are no dilated or thickened bowel loops. Moderate stool burden. Diverticulosis of the sigmoid colon without diverticulitis. Appendix tentatively identified. Resolution of previous pelvic free fluid. Fluid remains in both inguinal canals. IVC is patent.  No retroperitoneal adenopathy. Urinary bladder is decompressed, however thick walled. Prostate gland appears prominent. Advanced degenerative change in the lower lumbar spine. There are no acute or suspicious osseous abnormalities. Review of the MIP images confirms the above findings. IMPRESSION: 1. Post ascending aortic graft which is patent and unchanged in configuration from prior exam. Configuration of aortic dissection distal to the aortic graft extending from the arch through the common iliac arteries is unchanged from prior. The size of the lower abdominal aorta has progressed from prior exam, currently 3.2 cm, previously 2.8 cm. No surrounding soft tissue stranding. 2. Thick-walled urinary bladder, recommend correlation with urinalysis to exclude urinary tract infection. This appears greater than expected allowing for degree of distension. 3. Chronic  findings as described. Electronically Signed   By: Jeb Levering M.D.   On: 12/05/2015 02:12    Nicolette Bang, DO 12/07/2015, 8:02 AM PGY-1, [facility] Intern pager: [phone], text pages welcome

## 2015-12-07 NOTE — Discharge Instructions (Signed)
You were hospitalized for chest pain. The stress test of your heart was abnormal and you will need to follow up with the heart doctors outpatient.

## 2015-12-07 NOTE — Progress Notes (Signed)
Please see HF note from yesterday.  Myoview reviewed by Dr. Haroldine Laws yesterday pm.  Medical management.  We have signed off.   HF clinic follow up scheduled for 12/29/15. Placed in AVS.   Legrand Como "Oda Kilts, PA-C 12/07/2015 9:40 AM

## 2015-12-07 NOTE — Discharge Summary (Signed)
[facility] Discharge Summary  Patient name: Chad [name] Medical record number: [id number] Date of birth: Sep 27, 1961 Age: 54 y.o. Gender: male Date of Admission: 12/04/2015  Date of Discharge: 12/07/2015 Admitting Physician: Leeanne Rio, MD  Primary Care Provider: [name], MD Consultants: Cardiology  Indication for Hospitalization: Chest Pain  Discharge Diagnoses/Problem List:  Patient Active Problem List   Diagnosis Date Noted  . Aneurysm of descending thoracic aorta (HCC)   . Nasal folliculitis [id number]  . Chronic systolic CHF (congestive heart failure) ([place]) 04/12/2015  . CKD (chronic kidney disease) stage 3, GFR 30-59 ml/min 04/12/2015  . Unstable angina ([place]) 01/25/2015  . Chest pain 10/10/2014  . Health care maintenance 03/19/2014  . Local reaction to tetanus vaccine 03/19/2014  . Neck pain on left side 06/03/2013  . Penile discharge 02/26/2013  . Syncope 01/17/2013  . Acute on chronic systolic and diastolic heart failure, NYHA class 3 ([place]) 01/16/2013  . Chest pain, mid sternal 01/16/2013  . Low back pain 04/23/2012  . Chest pain 12/04/2011  . Chest pain syndrome   . Insomnia 09/14/2011  . 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 Coronary atherosclerosis of native coronary artery 06/12/2011  . Bradycardia 05/03/2011  . Aortic dissection, thoracoabdominal ([place])   . Chronic kidney disease (CKD), stage II (mild)   . ORTHOSTATIC HYPOTENSION 05/03/2010  . CAROTID BRUIT 01/25/2010  . PANCYTOPENIA 11/01/2009  . FATIGUE / MALAISE 10/25/2009  . DISSECTING AORTIC ANEURYSM THORACOABDOMINAL 07/14/2009  . SCIATICA, RIGHT 05/18/2009  . HYPERLIPIDEMIA-MIXED 09/29/2008  . HYPERTENSION, BENIGN 09/29/2008  . CAD, ARTERY BYPASS GRAFT 09/29/2008  . SYSTOLIC HEART FAILURE, CHRONIC 09/29/2008  . ICD - IN SITU 09/29/2008    Disposition: Home  Discharge Condition: Stable   Discharge Exam:  General:  Well appearing male, resting in bed Cardiovascular: RRR, S1, S2, 3/5 systolic murmur appreciated over right sternal border Respiratory: CTAB, no increased WOB Extremities: FROM, no LE edema   Brief Hospital Course:  Chad [name] is 54 y.o. male with extensive cardiac history (severe systolic CHF, aortic dissection, CAD s/p CABG) who presented with acute onset of chest pain and pressure. CT angio was negative for dissection, but did show some dilatation of lower abdominal aorta (previosuly 2.8 cm, now 3.2). EKG was unchanged from prior and troponins were normal x3. Cardiology was consulted and ordered a myoview. Myoview showed: evidence for reversibility and ischemia involving the anterolateral wall and a portion of the lateral wall, large infarct of the inferior wall, and LVEF of 32%. It was read as high risk stress test findings. Patient was kept NPO for possible cath. Cardiology felt that hight risk stress test findings could be a result of the low EF. They also felt that he was high risk for re-cath and, given that, elected to treat medically. Spirinolactone was added to his medication regimen for CHF during hospitalization. He was stable for discharge with close cardiology follow up.   Issues for Follow Up:  1. Would recommend considering switching hydralazine to an AceI for CHF and kidney benefit. Patient's Cr was at baseline with his CKD during hospitalization.  2. Patient will need cardiology follow up.  3. Will need outpatient f/u of AAA with repeat imaging.   Significant Procedures: None  Significant Labs and Imaging:   Recent Labs Lab 12/04/15 2238 12/06/15 0438  WBC 4.0 6.7  HGB 14.1 13.2  HCT 42.7 41.3  PLT 147* 132*    Recent Labs Lab 12/04/15 2238 12/06/15 0438  NA 141 140  K 3.6 3.3*  CL 107 106  CO2 27 27  GLUCOSE 91 109*  BUN 16 19  CREATININE 1.39* 1.55*  CALCIUM 9.0 8.5*   Troponin: <0.03, <0.03, < 0.03, 0.03   Dg Chest 2 View  12/04/2015  CLINICAL  DATA:  Onset of chest pain while on a flight. EXAM: CHEST  2 VIEW COMPARISON:  Radiographs 10/04/2015, CT 10/11/2014 FINDINGS: Stable cardiomegaly and mediastinal contours, patient is post repair of the thoracic aortic dissection. Post median sternotomy with single lead left-sided pacemaker in place. No pulmonary edema, confluent airspace disease, pleural effusion or pneumothorax. No acute osseous abnormalities are seen. IMPRESSION: No acute process.  Stable cardiomegaly and mediastinal contours. Electronically Signed   By: Jeb Levering M.D.   On: 12/04/2015 23:21   Nm Myocar Multi W/spect W/wall Motion / Ef  12/06/2015  CLINICAL DATA:  54 year old with history of coronary artery disease, post CABG and chest pain. EXAM: MYOCARDIAL IMAGING WITH SPECT (REST AND PHARMACOLOGIC-STRESS) GATED LEFT VENTRICULAR WALL MOTION STUDY LEFT VENTRICULAR EJECTION FRACTION TECHNIQUE: Standard myocardial SPECT imaging was performed after resting intravenous injection of 10 mCi Tc-73m sestamibi. Subsequently, intravenous infusion of Lexiscan was performed under the supervision of the Cardiology staff. At peak effect of the drug, 01/26/2015 mCi Tc-93m sestamibi was injected intravenously and standard myocardial SPECT imaging was performed. Quantitative gated imaging was also performed to evaluate left ventricular wall motion, and estimate left ventricular ejection fraction. COMPARISON:  None. FINDINGS: Perfusion: Again noted is a large infarct involving the inferior wall. There is mild reversibility involving the anterolateral wall and a small portion of the lateral wall near the apex and mid segments. Findings are suggestive for ischemia. Wall Motion: There is diffuse severe hypokinesia in the left ventricle. There is also dyskinesia involving the inferior wall compatible with the large infarct. Left Ventricular Ejection Fraction: [id number] End diastolic volume [id number] ml End systolic volume 0000000 ml IMPRESSION: 1. Evidence for  reversibility and ischemia involving the anterolateral wall and a portion of the lateral wall. 2. Large infarct involving the inferior wall. There is dyskinesia in the inferior wall. 3. Left ventricular ejection fraction is 32%. Left ventricle dilatation. 4. High-risk stress test findings*. *2012 Appropriate Use Criteria for Coronary Revascularization Focused Update: J Am Coll Cardiol. [id number]. http://content.airportbarriers.com.aspx?articleid=[email or web address] These results will be called to the ordering clinician or representative by the Radiologist Assistant, and communication documented in the PACS or zVision Dashboard. Electronically Signed   By: Markus Daft M.D.   On: 12/06/2015 16:42   Ct Angio Chest Aorta W/cm &/or Wo/cm  12/05/2015  CLINICAL DATA:  Chest pain. History of aortic dissection post repair 6 years prior. EXAM: CT ANGIOGRAPHY CHEST, ABDOMEN AND PELVIS TECHNIQUE: Multidetector CT imaging through the chest, abdomen and pelvis was performed using the standard protocol during bolus administration of intravenous contrast. Multiplanar reconstructed images and MIPs were obtained and reviewed to evaluate the vascular anatomy. CONTRAST:  100 mL Omnipaque 350 IV COMPARISON:  Most recent CTA 10/11/2014.  CT abdomen 03/21/2015 FINDINGS: CTA CHEST FINDINGS Aortic stent graft involving the ascending thoracic aorta which is patent and unchanged in appearance from prior exam. The aortic root measures 4.1 cm, previously 4.3 cm. Is cardiac motion through the aortic root. Again noted is aortic dissection at the aortic arch just be on the surgical graft. Overall appearance of the dissection is unchanged from prior exam. There is no extension of dissection to the great vessels from the aortic arch. The proximal descending thoracic aorta  measures 4.4 cm, previously 4.5 cm. Dissection extends through the descending thoracic aorta with opacification of the false and true lumen. Overall size and configuration is  unchanged. There is no aortic hematoma or surrounding soft tissue stranding. Multi chamber cardiomegaly with pacemaker, unchanged in appearance. No adenopathy. Patulous esophagus. No filling defects in the central pulmonary arteries. No pleural or pericardial effusion. Mild dependent atelectasis in the lower lungs. No consolidation to suggest pneumonia. Absent left thyroid tissue. There are no acute or suspicious osseous abnormalities. Patient is post median sternotomy. Review of the MIP images confirms the above findings. CTA ABDOMEN AND PELVIS FINDINGS Aortic dissection involves the abdominal aorta through the common iliac arteries bilaterally. Dissection extends to the celiac trunk. Dissection involves the proximal SMA. IMA arises from the true lumen. The abdominal aorta at the level of the celiac trunk measures 4 cm, unchanged. Distal abdominal aorta measures 3.2 cm, previously 2.8 cm. Dominant SMA branches are patent. Right renal artery originates from the true lumen. Left renal artery originates from the true lumen. Dissection does not involve the internal or external iliac arteries or femoral arteries. Arterial phase imaging of the liver, adrenal glands, and pancreas are unremarkable. Stones or sludge in the gallbladder, physiologic gallbladder distention. Small cyst in the spleen, not definitively seen prior, however likely incidental. Chronic left renal atrophy with only minimal enhancement of the lower pole. Cortical scarring in the lower right kidney, unchanged. No right hydronephrosis. Stomach is decompressed. There are no dilated or thickened bowel loops. Moderate stool burden. Diverticulosis of the sigmoid colon without diverticulitis. Appendix tentatively identified. Resolution of previous pelvic free fluid. Fluid remains in both inguinal canals. IVC is patent.  No retroperitoneal adenopathy. Urinary bladder is decompressed, however thick walled. Prostate gland appears prominent. Advanced degenerative  change in the lower lumbar spine. There are no acute or suspicious osseous abnormalities. Review of the MIP images confirms the above findings. IMPRESSION: 1. Post ascending aortic graft which is patent and unchanged in configuration from prior exam. Configuration of aortic dissection distal to the aortic graft extending from the arch through the common iliac arteries is unchanged from prior. The size of the lower abdominal aorta has progressed from prior exam, currently 3.2 cm, previously 2.8 cm. No surrounding soft tissue stranding. 2. Thick-walled urinary bladder, recommend correlation with urinalysis to exclude urinary tract infection. This appears greater than expected allowing for degree of distension. 3. Chronic findings as described. Electronically Signed   By: Jeb Levering M.D.   On: 12/05/2015 02:12   Ct Cta Abd/pel W/cm &/or W/o Cm  12/05/2015  CLINICAL DATA:  Chest pain. History of aortic dissection post repair 6 years prior. EXAM: CT ANGIOGRAPHY CHEST, ABDOMEN AND PELVIS TECHNIQUE: Multidetector CT imaging through the chest, abdomen and pelvis was performed using the standard protocol during bolus administration of intravenous contrast. Multiplanar reconstructed images and MIPs were obtained and reviewed to evaluate the vascular anatomy. CONTRAST:  100 mL Omnipaque 350 IV COMPARISON:  Most recent CTA 10/11/2014.  CT abdomen 03/21/2015 FINDINGS: CTA CHEST FINDINGS Aortic stent graft involving the ascending thoracic aorta which is patent and unchanged in appearance from prior exam. The aortic root measures 4.1 cm, previously 4.3 cm. Is cardiac motion through the aortic root. Again noted is aortic dissection at the aortic arch just be on the surgical graft. Overall appearance of the dissection is unchanged from prior exam. There is no extension of dissection to the great vessels from the aortic arch. The proximal descending thoracic aorta measures  4.4 cm, previously 4.5 cm. Dissection extends  through the descending thoracic aorta with opacification of the false and true lumen. Overall size and configuration is unchanged. There is no aortic hematoma or surrounding soft tissue stranding. Multi chamber cardiomegaly with pacemaker, unchanged in appearance. No adenopathy. Patulous esophagus. No filling defects in the central pulmonary arteries. No pleural or pericardial effusion. Mild dependent atelectasis in the lower lungs. No consolidation to suggest pneumonia. Absent left thyroid tissue. There are no acute or suspicious osseous abnormalities. Patient is post median sternotomy. Review of the MIP images confirms the above findings. CTA ABDOMEN AND PELVIS FINDINGS Aortic dissection involves the abdominal aorta through the common iliac arteries bilaterally. Dissection extends to the celiac trunk. Dissection involves the proximal SMA. IMA arises from the true lumen. The abdominal aorta at the level of the celiac trunk measures 4 cm, unchanged. Distal abdominal aorta measures 3.2 cm, previously 2.8 cm. Dominant SMA branches are patent. Right renal artery originates from the true lumen. Left renal artery originates from the true lumen. Dissection does not involve the internal or external iliac arteries or femoral arteries. Arterial phase imaging of the liver, adrenal glands, and pancreas are unremarkable. Stones or sludge in the gallbladder, physiologic gallbladder distention. Small cyst in the spleen, not definitively seen prior, however likely incidental. Chronic left renal atrophy with only minimal enhancement of the lower pole. Cortical scarring in the lower right kidney, unchanged. No right hydronephrosis. Stomach is decompressed. There are no dilated or thickened bowel loops. Moderate stool burden. Diverticulosis of the sigmoid colon without diverticulitis. Appendix tentatively identified. Resolution of previous pelvic free fluid. Fluid remains in both inguinal canals. IVC is patent.  No retroperitoneal  adenopathy. Urinary bladder is decompressed, however thick walled. Prostate gland appears prominent. Advanced degenerative change in the lower lumbar spine. There are no acute or suspicious osseous abnormalities. Review of the MIP images confirms the above findings. IMPRESSION: 1. Post ascending aortic graft which is patent and unchanged in configuration from prior exam. Configuration of aortic dissection distal to the aortic graft extending from the arch through the common iliac arteries is unchanged from prior. The size of the lower abdominal aorta has progressed from prior exam, currently 3.2 cm, previously 2.8 cm. No surrounding soft tissue stranding. 2. Thick-walled urinary bladder, recommend correlation with urinalysis to exclude urinary tract infection. This appears greater than expected allowing for degree of distension. 3. Chronic findings as described. Electronically Signed   By: Jeb Levering M.D.   On: 12/05/2015 02:12     Results/Tests Pending at Time of Discharge: None   Discharge Medications:    Medication List    TAKE these medications        amLODipine 5 MG tablet  Commonly known as:  NORVASC  Take 1 tablet (5 mg total) by mouth 2 (two) times daily.     aspirin EC 81 MG tablet  Take 1 tablet (81 mg total) by mouth daily.     b complex vitamins tablet  Take 2 tablets by mouth daily.     carvedilol 25 MG tablet  Commonly known as:  COREG  Take 1 tablet (25 mg total) by mouth 2 (two) times daily with a meal.     clopidogrel 75 MG tablet  Commonly known as:  PLAVIX  Take 1 tablet (75 mg total) by mouth at bedtime.     cyclobenzaprine 10 MG tablet  Commonly known as:  FLEXERIL  Take 10 mg by mouth daily as needed for muscle  spasms.     diphenhydrAMINE 25 MG tablet  Commonly known as:  BENADRYL  Take 12.5 mg by mouth 2 (two) times daily as needed for itching or allergies.     ezetimibe 10 MG tablet  Commonly known as:  ZETIA  Take 1 tablet (10 mg total) by mouth  daily.     furosemide 40 MG tablet  Commonly known as:  LASIX  Take 1 tablet (40 mg total) by mouth every other day. Take daily for 3 days then take one tablet  every other day     hydrALAZINE 50 MG tablet  Commonly known as:  APRESOLINE  Take 1.5 tablets (75 mg total) by mouth 3 (three) times daily.     isosorbide mononitrate 60 MG 24 hr tablet  Commonly known as:  IMDUR  Take 1 tablet (60 mg total) by mouth 2 (two) times daily.     isosorbide mononitrate 60 MG 24 hr tablet  Commonly known as:  IMDUR  Take 1 tablet (60 mg total) by mouth 2 (two) times daily.     multivitamin with minerals Tabs tablet  Take 1 tablet by mouth daily. Mega Men 50 Plus     nitroGLYCERIN 0.4 MG SL tablet  Commonly known as:  NITROSTAT  Place 1 tablet (0.4 mg total) under the tongue every 5 (five) minutes x 3 doses as needed for chest pain.     Oxycodone HCl 10 MG Tabs  Take 10 mg by mouth 4 (four) times daily.     potassium chloride 10 MEQ tablet  Commonly known as:  K-DUR  Take 1 tablet (10 mEq total) by mouth daily.     pregabalin 75 MG capsule  Commonly known as:  LYRICA  Take 75 mg by mouth 2 (two) times daily.     spironolactone 25 MG tablet  Commonly known as:  ALDACTONE  Take 0.5 tablets (12.5 mg total) by mouth daily.        Discharge Instructions: Please refer to Patient Instructions section of EMR for full details.  Patient was counseled important signs and symptoms that should prompt return to medical care, changes in medications, dietary instructions, activity restrictions, and follow up appointments.   Follow-Up Appointments: Follow-up Information    Follow up with Glori Bickers, MD On 12/29/2015.   Specialty:  Cardiology   Why:  at 920 for post hospital follow up. Please bring all of your medications to your visit.  The code for parking is 1000.   Contact information:   [address] [place] [address] [phone]       Follow up with [place]. Go on 12/14/2015.   Specialty:  Family Medicine   Why:  For Hospital Followup with Dr. Sherril Cong at 2:00pm   Contact information:   [address] [id number] [place] [id number] [place], DO 12/07/2015, 12:01 PM PGY-1, [place]

## 2015-12-08 ENCOUNTER — Encounter: Payer: Medicaid Other | Admitting: Cardiothoracic Surgery

## 2015-12-14 ENCOUNTER — Inpatient Hospital Stay: Payer: Medicaid Other | Admitting: Family Medicine

## 2015-12-14 MED ORDER — CHLORHEXIDINE GLUCONATE 4 % EX LIQD: 1.0000 "application " | Freq: Once | CUTANEOUS | Status: DC

## 2015-12-14 MED ORDER — CEFAZOLIN SODIUM-DEXTROSE 2-3 GM-% IV SOLR: 2.0000 g | INTRAVENOUS | Status: DC

## 2015-12-14 NOTE — Progress Notes (Incomplete)
I called Dr Vicente Serene

## 2015-12-15 ENCOUNTER — Ambulatory Visit (HOSPITAL_COMMUNITY): Admission: RE | Admit: 2015-12-15 | Payer: Medicaid Other | Source: Ambulatory Visit | Admitting: General Surgery

## 2015-12-15 ENCOUNTER — Encounter (HOSPITAL_COMMUNITY): Admission: RE | Payer: Self-pay | Source: Ambulatory Visit

## 2015-12-15 SURGERY — LAPAROSCOPIC CHOLECYSTECTOMY
Anesthesia: General

## 2015-12-21 ENCOUNTER — Inpatient Hospital Stay: Payer: Medicaid Other | Admitting: Student

## 2015-12-21 ENCOUNTER — Telehealth (HOSPITAL_COMMUNITY): Payer: Self-pay

## 2015-12-21 NOTE — Telephone Encounter (Signed)
[name] with partnership for [facility] left VM on CHF triage line concerning patient and telemonitoring, did not list specifics. Left returning VM to call us back and leave detailed message if she gets machine on what is needed related to patient and telemonitoring.  Renee Pain

## 2015-12-22 ENCOUNTER — Ambulatory Visit: Payer: Medicaid Other | Admitting: Cardiothoracic Surgery

## 2015-12-29 ENCOUNTER — Ambulatory Visit: Payer: Medicaid Other | Admitting: Cardiothoracic Surgery

## 2015-12-29 ENCOUNTER — Encounter (HOSPITAL_COMMUNITY): Payer: Medicaid Other | Admitting: Internal Medicine

## 2015-12-30 ENCOUNTER — Telehealth (HOSPITAL_COMMUNITY): Payer: Self-pay

## 2015-12-30 NOTE — Telephone Encounter (Signed)
[name] with partnership for [facility] called CHF triage line to report patient has been noncompliant with telemonitoring services. Patient has not done any telemonitoring for the whole month of December, or any so far in January. Webb Silversmith states that patient has told them multiple times that he has just been busy working and will do it, but has not yet followed through. Webb Silversmith informed us that this will soon be discontinued from patient as he is not utilizing equipment. Attempted to call patient to touch base, but no answer. Will let providers know.  Renee Pain

## 2016-01-04 ENCOUNTER — Other Ambulatory Visit: Payer: Self-pay | Admitting: Internal Medicine

## 2016-01-04 NOTE — Telephone Encounter (Signed)
Spironolactone refilled- please have pt f/u in our clinic within the next month (with myself or someone else) prior to any more refills on Aldactone (we need to continue to monitor his kidney function and potassium).  Thanks, [name], MD [facility] Family Medicine Resident  01/04/2016, 5:13 PM

## 2016-01-07 NOTE — Telephone Encounter (Signed)
Patient informed, appointment scheduled with PCP on 1/20.

## 2016-01-11 ENCOUNTER — Telehealth (HOSPITAL_COMMUNITY): Payer: Self-pay | Admitting: Vascular Surgery

## 2016-01-11 NOTE — Telephone Encounter (Signed)
Chad [name] called pt is being D/c from Tele monitoring because of  no pt response.[name] Please advise

## 2016-01-12 ENCOUNTER — Ambulatory Visit: Payer: Medicaid Other | Admitting: Cardiothoracic Surgery

## 2016-01-13 ENCOUNTER — Ambulatory Visit (HOSPITAL_COMMUNITY)
Admission: RE | Admit: 2016-01-13 | Discharge: 2016-01-13 | Disposition: A | Discharge status: Home / Self Care | Payer: Medicaid Other | Source: Ambulatory Visit | Attending: Cardiology | Admitting: Cardiology

## 2016-01-13 VITALS — BP 112/78 | HR 98 | Wt 189.4 lb

## 2016-01-13 DIAGNOSIS — I10 Essential (primary) hypertension: Secondary | ICD-10-CM

## 2016-01-13 DIAGNOSIS — I130 Hypertensive heart and chronic kidney disease with heart failure and stage 1 through stage 4 chronic kidney disease, or unspecified chronic kidney disease: Secondary | ICD-10-CM | POA: Diagnosis not present

## 2016-01-13 DIAGNOSIS — I2510 Atherosclerotic heart disease of native coronary artery without angina pectoris: Secondary | ICD-10-CM | POA: Diagnosis not present

## 2016-01-13 DIAGNOSIS — N182 Chronic kidney disease, stage 2 (mild): Secondary | ICD-10-CM | POA: Diagnosis not present

## 2016-01-13 DIAGNOSIS — Z951 Presence of aortocoronary bypass graft: Secondary | ICD-10-CM | POA: Insufficient documentation

## 2016-01-13 DIAGNOSIS — Z9119 Patient's noncompliance with other medical treatment and regimen: Secondary | ICD-10-CM | POA: Diagnosis not present

## 2016-01-13 DIAGNOSIS — I5022 Chronic systolic (congestive) heart failure: Secondary | ICD-10-CM | POA: Diagnosis present

## 2016-01-13 DIAGNOSIS — N189 Chronic kidney disease, unspecified: Secondary | ICD-10-CM | POA: Diagnosis not present

## 2016-01-13 LAB — BASIC METABOLIC PANEL
Anion gap: 7 (ref 5–15)
BUN: 18 mg/dL (ref 6–20)
CHLORIDE: 106 mmol/L (ref 101–111)
CO2: 30 mmol/L (ref 22–32)
CREATININE: 1.53 mg/dL — AB (ref 0.61–1.24)
Calcium: 9.4 mg/dL (ref 8.9–10.3)
GFR calc Af Amer: 58 mL/min — ABNORMAL LOW (ref >60)
GFR calc non Af Amer: 50 mL/min — ABNORMAL LOW (ref >60)
Glucose, Bld: 98 mg/dL (ref 65–99)
Potassium: 4 mmol/L (ref 3.5–5.1)
Sodium: 143 mmol/L (ref 135–145)

## 2016-01-13 NOTE — Progress Notes (Signed)
Patient ID: Chad [name], male   DOB: 06-25-1961, 55 y.o.   MRN: [id number]   Advanced Heart Failure Clinic Note   Patient ID: Chad [name], male   DOB: 20-Jun-1961, 55 y.o.   MRN: [id number]  History of Present Illness: Primary Cardiologist:  Dr. Demetrios Loll is a 55 y.o. male with history of severe HTN, coronary artery disease status post previous myocardial infarction and bypass surgery in 2006 with DES to native PDA in 2011.  He also has a history congestive heart failure secondary to ischemic cardiomyopathy   He is s/p single chamber [employer] ICD.  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 He underwent emergent repair of proximal aorta and reimplantation of his CABG grafts.  Carotids u/s in 2011 0-39%.  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 Also with significant low back pain s/p 2 surgeries.   Admitted December 2016 with chest pain. Had myoview with minimal changes noted. He was later discharged.   He returns for post hospital follow up. Overall feeling ok. Had chest pain at rest on Monday. Took 2 NTG that relieved pain. Pain on Monday was different and more intense. Says chest pain happens more frequently. Weight at home 185 pounds. Appetite ok. Took all meds except spiro. Ran out and did not have refills. Working 80 hours per week. Not following low salt diet.    ETT/Myoview in 9/12: Walked 8:46 on treadmill. Moderate-sized fixed inferior perfusion defect consistent with prior MI. EF 31% with septal hypokinesis. There was also inferior hypokinesis. Similar to prior study.   Myoview 01/2015 - High risk stress nuclear study with chest pain but no new electrocardiographic changes. The scintigraphic results show a large prior inferior infarct with very mild peri-infarct ischemia. The gated ejection fraction was 27% with global hypokinesis and inferior akinesis. Study felt to be high risk because  of reduced RV function  11/2015 Myoview.  Evidence for reversibility and ischemia involving the anterolateral wall and a portion of the lateral wall.. Large infarct involving the inferior wall. There is dyskinesia in the inferior wall. Left ventricular ejection fraction is 32%. Left ventricle dilatation. High-risk stress test findings*.  10/22/2015: ECHO EF 20-25% 11/26/12 ECHO EF 35-40%  Labs 11/25/13 Cholesterol 186 TG 121 HDL 49  Labs 12/09/13 K 3.6 creatinine 1.3  Labs 3/15 K 4.1, creatinine 1.4, BNP 589, HCT 39.8 Labs 3/16 K 3.8, creatinine 1.56, HCT 36.5 Labs 12/07/2015: K 3.6 Creatinine 1.37   ROS: All pertinent positives and negatives as in HPI, otherwise negative.    Past Medical History  Diagnosis Date  . CAD (coronary artery disease)     a. s/p CABG 2006;  b. DES to PDA 2011 (cath: Dx not seen, dRCA/PDA tx with DES; S-PDA occluded (culprit), S-Dx occluded, S-RI and OM ok, L-LAD ok  . HTN (hypertension)     severe  . Chronic systolic heart failure (HCC)     a. 12/13 ECHO: EF 35-40%, sept, apical & posterobasal HK, LV mod dil & sys fx mod reduced, mild AI, MV mild reg, TV mild reg  . Aortic dissection, thoracoabdominal ([place])     7/10: Type I s/p repair  . CRI (chronic renal insufficiency)   . Thyroid cancer ([place])     Hertle Cell  . HLD (hyperlipidemia)   . Anemia   . Gout   . AICD (automatic cardioverter/defibrillator) present   . Carotid stenosis  dopplers 2011: 0-39% bilat.  . Chest pain syndrome   . CHF (congestive heart failure) ([place])     Current Outpatient Prescriptions  Medication Sig Dispense Refill  . amLODipine (NORVASC) 5 MG tablet Take 1 tablet (5 mg total) by mouth 2 (two) times daily. 60 tablet 6  . aspirin EC 81 MG tablet Take 1 tablet (81 mg total) by mouth daily. 90 tablet 3  . carvedilol (COREG) 25 MG tablet Take 1 tablet (25 mg total) by mouth 2 (two) times daily with a meal. 60 tablet 6  . clopidogrel (PLAVIX) 75 MG tablet Take 1 tablet  (75 mg total) by mouth at bedtime. 30 tablet 6  . cyclobenzaprine (FLEXERIL) 10 MG tablet Take 10 mg by mouth daily as needed for muscle spasms.     . diphenhydrAMINE (BENADRYL) 25 MG tablet Take 12.5 mg by mouth 2 (two) times daily as needed for itching or allergies.    [name] ezetimibe (ZETIA) 10 MG tablet Take 1 tablet (10 mg total) by mouth daily. 30 tablet 6  . hydrALAZINE (APRESOLINE) 50 MG tablet Take 1.5 tablets (75 mg total) by mouth 3 (three) times daily. 135 tablet 6  . isosorbide mononitrate (IMDUR) 60 MG 24 hr tablet Take 1 tablet (60 mg total) by mouth 2 (two) times daily. 30 tablet 0  . Multiple Vitamin (MULTIVITAMIN WITH MINERALS) TABS tablet Take 1 tablet by mouth daily. Mega Men 50 Plus    . nitroGLYCERIN (NITROSTAT) 0.4 MG SL tablet Place 1 tablet (0.4 mg total) under the tongue every 5 (five) minutes x 3 doses as needed for chest pain. 30 tablet 12  . Oxycodone HCl 10 MG TABS Take 20 mg by mouth 3 (three) times daily.     . potassium chloride (K-DUR) 10 MEQ tablet Take 1 tablet (10 mEq total) by mouth daily. (Patient taking differently: Take 10-50 mEq by mouth daily as needed (cramps). ) 30 tablet 6  . pregabalin (LYRICA) 75 MG capsule Take 75 mg by mouth 2 (two) times daily.    [name] spironolactone (ALDACTONE) 25 MG tablet TAKE ONE-HALF TABLET BY MOUTH ONCE DAILY 30 tablet 0   No current facility-administered medications for this encounter.    Allergies  Allergen Reactions  . Iohexol Anaphylaxis and Other (See Comments)     Desc: PT HAS ANAPHYLAXIS WITH CONTRAST MEDIA!  . Lipitor [Atorvastatin Calcium] Anaphylaxis and Other (See Comments)    Large doses  . Shellfish Allergy Anaphylaxis  . Sulfonamide Derivatives Shortness Of Breath  . Latex Rash    With long periods of exposure  . Zocor [Simvastatin] Other (See Comments)    Muscle cramps    Vital Signs: Filed Vitals:   01/13/16 1054  BP: 112/78  Pulse: 98  Weight: 189 lb 6.4 oz (85.911 kg)  SpO2: 97%    Physical  Exam: General:  Well appearing. No resp difficulty. Girlfriend present  HEENT: normal Neck: supple. JVP 5-6cm. Carotids 2+ bilat; carotid bruits L>R No lymphadenopathy or thryomegaly. Cor: PMI nondisplaced. Regular rate & rhythm. 2/6 SEM RUSB  Lungs: CTA Abdomen: soft, nontender, mildly distended. No hepatosplenomegaly. No bruits or masses. Good bowel sounds. Extremities: no cyanosis, clubbing, rash. No edema.  Neuro: alert & orientedx3, cranial nerves grossly intact. moves all 4 extremities w/o difficulty. Affect flat  ASSESSMENT/Plan  1. Chronic systolic HF: Ischemic cardiomyopathy.  Echo (2/16) with EF 25%. S/p [employer] ICD. NYHA class II symptoms. Continue Lasix 20 mg every other day. Will have him take an extra  dose or two over the next few days - On goal carvedilol 25 mg twice a day.   Currently taking hydralazine 75 mg bid/ Imdur 60 bid.  - Continue spironolactone 12.5 mg daily.  - Not on ACEI due to CKD and solitary kidney. - Check BMET  - Reinforced the need and importance of daily weights, a low sodium diet, and fluid restriction (less than 2 L a day). Instructed to call the HF clinic if weight increases more than 3 lbs overnight or 5 lbs in a week.  - Echo overdue. 2. CAD: s/p CABG.  Occasional atypical chest pain, not related to activity. Dr Haroldine Laws reviewed myoview and did note minor changes. I discussed LHC with [place] today but he would like to hold off.  -Cardiolite 11/2015 with evidence for reversibility and ischemia involving the anterolateral wall and a portion of the lateral wall. - Cardiolite in 2/16 with inferior scar c/w prior MI, no significant ischemia. Would have very high threshold to cath with chronic aoritc dissection and CKD with solitary kidney. - Continue Plavix and ASA 81 mg daily.   - Statin intolerance at any dose.  - Continue Zetia. Last lipids 09/2014 (normal).  3. 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 HTN: -  stable.  5. H/o Type I aortic dissection: Follows with CVTS.  6. Non-compliance  Follow up in 6 weeks. I have asked him to call 911 if he has chest pain.   Amy Clegg NP-C

## 2016-01-13 NOTE — Patient Instructions (Signed)
Labs today  Your physician recommends that you schedule a follow-up appointment in: 6 weeks with Dr. Bensimhon  Do the following things EVERYDAY: 1) Weigh yourself in the morning before breakfast. Write it down and keep it in a log. 2) Take your medicines as prescribed 3) Eat low salt foods-Limit salt (sodium) to 2000 mg per day.  4) Stay as active as you can everyday 5) Limit all fluids for the day to less than 2 liters. 6)   

## 2016-01-13 NOTE — Progress Notes (Signed)
Advanced Heart Failure Medication Review by a Pharmacist  Does the patient  feel that his/her medications are working for him/her?  yes  Has the patient been experiencing any side effects to the medications prescribed?  no  Does the patient measure his/her own blood pressure or blood glucose at home?  no   Does the patient have any problems obtaining medications due to transportation or finances?   no  Understanding of regimen: fair Understanding of indications: fair Potential of compliance: good Patient understands to avoid NSAIDs. Patient understands to avoid decongestants.  Issues to address at subsequent visits: None   Pharmacist comments:  Chad [name] is a pleasant 55 yo M presenting with his girlfriend and with his medication bottles. He reports good compliance with his medications this time stating that he only misses doses if he runs out. He also stated that he has been taking furosemide 40 mg BID but has not been taking spironolactone for about a week d/t running out and someone telling him that we needed to check his kidney function before he should restart. He also admitted to taking his potassium supplementation 10 mEq daily with an additional tablet if he starts having cramps. He states that he only takes OTC potassium gluconate if he runs out of his Rx and he verbalized understanding of only utilizing the Rx from now on.   Chad [name]. Chad [name], PharmD, BCPS, CPP Clinical Pharmacist Pager: [phone] Phone: [phone] 01/13/2016 11:12 AM      Time with patient: 10 minutes Preparation and documentation time: 2 minutes Total time: 12 minutes

## 2016-01-14 ENCOUNTER — Ambulatory Visit: Payer: Medicaid Other | Admitting: Cardiothoracic Surgery

## 2016-01-14 ENCOUNTER — Ambulatory Visit (INDEPENDENT_AMBULATORY_CARE_PROVIDER_SITE_OTHER): Payer: Medicaid Other | Admitting: Family Medicine

## 2016-01-14 ENCOUNTER — Encounter: Payer: Self-pay | Admitting: Family Medicine

## 2016-01-14 VITALS — BP 113/68 | HR 66 | Temp 98.2°F | Resp 18 | Ht 71.0 in | Wt 188.7 lb

## 2016-01-14 DIAGNOSIS — M5441 Lumbago with sciatica, right side: Secondary | ICD-10-CM

## 2016-01-14 DIAGNOSIS — I10 Essential (primary) hypertension: Secondary | ICD-10-CM | POA: Diagnosis not present

## 2016-01-14 DIAGNOSIS — Z1211 Encounter for screening for malignant neoplasm of colon: Secondary | ICD-10-CM | POA: Diagnosis not present

## 2016-01-14 DIAGNOSIS — I25700 Atherosclerosis of coronary artery bypass graft(s), unspecified, with unstable angina pectoris: Secondary | ICD-10-CM

## 2016-01-14 DIAGNOSIS — I5043 Acute on chronic combined systolic (congestive) and diastolic (congestive) heart failure: Secondary | ICD-10-CM | POA: Diagnosis not present

## 2016-01-14 MED ORDER — CARVEDILOL 25 MG PO TABS: 25.0000 mg | ORAL_TABLET | Freq: Two times a day (BID) | ORAL | Status: DC

## 2016-01-14 MED ORDER — ISOSORBIDE MONONITRATE ER 60 MG PO TB24: 60.0000 mg | ORAL_TABLET | Freq: Two times a day (BID) | ORAL | Status: DC

## 2016-01-14 MED ORDER — SPIRONOLACTONE 25 MG PO TABS: 12.5000 mg | ORAL_TABLET | Freq: Every day | ORAL | Status: DC

## 2016-01-14 MED ORDER — FUROSEMIDE 40 MG PO TABS: 40.0000 mg | ORAL_TABLET | Freq: Two times a day (BID) | ORAL | Status: DC

## 2016-01-14 MED ORDER — AMLODIPINE BESYLATE 5 MG PO TABS: 5.0000 mg | ORAL_TABLET | Freq: Two times a day (BID) | ORAL | Status: DC

## 2016-01-14 MED ORDER — HYDRALAZINE HCL 50 MG PO TABS: 75.0000 mg | ORAL_TABLET | Freq: Three times a day (TID) | ORAL | Status: DC

## 2016-01-14 MED ORDER — CLOPIDOGREL BISULFATE 75 MG PO TABS: 75.0000 mg | ORAL_TABLET | Freq: Every day | ORAL | Status: DC

## 2016-01-14 MED ORDER — POTASSIUM CHLORIDE ER 10 MEQ PO TBCR: 10.0000 meq | EXTENDED_RELEASE_TABLET | Freq: Every day | ORAL | Status: DC

## 2016-01-14 MED ORDER — EZETIMIBE 10 MG PO TABS: 10.0000 mg | ORAL_TABLET | Freq: Every day | ORAL | Status: DC

## 2016-01-14 NOTE — Telephone Encounter (Signed)
Discussed w/pt at his OV 1/19, he states he is not using equipment b/c he is out of town so much for work, he will return it.  Spoke w/Anne Rudd, let her know we are d/c telemonitoring she will contact pt to get equipment

## 2016-01-14 NOTE — Assessment & Plan Note (Signed)
Stable and at goal. Need to continue strict control given h/o aortic dissection. - continue current regimen

## 2016-01-14 NOTE — Assessment & Plan Note (Signed)
Pain and sciatica stable. No new symptoms. Follows with [facility] pain clinic. - continue to monitor

## 2016-01-14 NOTE — Assessment & Plan Note (Signed)
Seems to be stable. Stressed importance of diet and daily weights.  Appears dry on exam. Patient to establish dry weight on home scale. Will contact CHF clinic and consider taking additional lasix if weight increases by 3lbs in one day or 5lbs in one week.  - continue coreg 25mg  BID - continue hydralazine 75mg  TID - continue Imdur 60mg  BID  - Continue Aldactone 12.5mg  daily  - has f/u with CHF in 6 weeks- consider recheck of BMET at that time  - no on ACE/ARB due to solitary kidney and CKD - Continue to monitor salt and fluid intake.  - f/u with me in 3 months

## 2016-01-14 NOTE — Progress Notes (Signed)
Patient ID: Chad [name], male   DOB: November 02, 1961, 55 y.o.   MRN: [id number]    Subjective: CC: f/u  CHF, HTN HPI: Patient is a 55 y.o. male with a past medical history of  HTN, CAD s/p CABG, combined HF with an ICD in place, type 1 aortic dissection presenting to clinic today for f/u.  Heart failure:  Patient does not weigh himself regularly but thinks his dry weight is around 180, however noted to be 189 at last CHF clinic appt. Patient currently taking Coreg BID, hydralazine TID, and spironolactone 12.5mg  daily. He only has 1 kidney and is not on an [name] or ARB. Lasix on his EMR states 40mg  BID, only notes he's taking it as needed for swelling and "rattiling in his chest." He significant dietary indiscretion but note he's getting better. No cough, swelling, SOB, DOE,  PND, orthopnea.   CAD: patient has had to take nitro once this week. Chest pain that started at rest, resolved with nitro. Saw CHF clinic yesterday who suggested LHC however pt noted he'd hold off on this.  Currently on ASA and Plavix. Has significant muscle aches with statins. No chest pain currently.  HTN: stable on current regimen. No side effects. No orthostatic type symptoms  Chronic pain: Stable. [facility]. On oxycodone, Flexeril, and Lyrica.   Social History: never smoker  Health Maintenance:  Due for colonoscopy.   ROS: All other systems reviewed and are negative besides that noted in HPI.   Past Medical History Patient Active Problem List   Diagnosis Date Noted  . Pain in the chest   . Aneurysm of descending thoracic aorta ([place])   . Nasal folliculitis [id number]  . Chronic systolic CHF (congestive heart failure) ([place]) 04/12/2015  . CKD (chronic kidney disease) stage 3, GFR 30-59 ml/min 04/12/2015  . Unstable angina ([place]) 01/25/2015  . Chest pain 10/10/2014  . Health care maintenance 03/19/2014  . Local reaction to tetanus vaccine 03/19/2014  . Neck pain on left side 06/03/2013  . Penile discharge  02/26/2013  . Syncope 01/17/2013  . Acute on chronic systolic and diastolic heart failure, NYHA class 3 ([place]) 01/16/2013  . Chest pain, mid sternal 01/16/2013  . Low back pain 04/23/2012  . Chest pain 12/04/2011  . Chest pain syndrome   . Insomnia 09/14/2011  . 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 Coronary atherosclerosis of native coronary artery 06/12/2011  . Bradycardia 05/03/2011  . Aortic dissection, thoracoabdominal ([place])   . Chronic kidney disease (CKD), stage II (mild)   . ORTHOSTATIC HYPOTENSION 05/03/2010  . CAROTID BRUIT 01/25/2010  . PANCYTOPENIA 11/01/2009  . FATIGUE / MALAISE 10/25/2009  . DISSECTING AORTIC ANEURYSM THORACOABDOMINAL 07/14/2009  . SCIATICA, RIGHT 05/18/2009  . HYPERLIPIDEMIA-MIXED 09/29/2008  . HYPERTENSION, BENIGN 09/29/2008  . CAD, ARTERY BYPASS GRAFT 09/29/2008  . SYSTOLIC HEART FAILURE, CHRONIC 09/29/2008  . ICD - IN SITU 09/29/2008    Medications- reviewed and updated Current Outpatient Prescriptions  Medication Sig Dispense Refill  . amLODipine (NORVASC) 5 MG tablet Take 1 tablet (5 mg total) by mouth 2 (two) times daily. 60 tablet 6  . aspirin EC 81 MG tablet Take 1 tablet (81 mg total) by mouth daily. 90 tablet 3  . carvedilol (COREG) 25 MG tablet Take 1 tablet (25 mg total) by mouth 2 (two) times daily with a meal. 60 tablet 6  . clopidogrel (PLAVIX) 75 MG tablet Take 1 tablet (75 mg total) by mouth at bedtime. 30 tablet 6  .  cyclobenzaprine (FLEXERIL) 10 MG tablet Take 10 mg by mouth daily as needed for muscle spasms.     . diphenhydrAMINE (BENADRYL) 25 MG tablet Take 12.5 mg by mouth 2 (two) times daily as needed for itching or allergies. Reported on 01/13/2016    . furosemide (LASIX) 40 MG tablet Take 1 tablet (40 mg total) by mouth 2 (two) times daily. 30 tablet 6  . hydrALAZINE (APRESOLINE) 50 MG tablet Take 1.5 tablets (75 mg total) by mouth 3 (three) times daily. 135 tablet 6  . isosorbide mononitrate  (IMDUR) 60 MG 24 hr tablet Take 1 tablet (60 mg total) by mouth 2 (two) times daily. 30 tablet 6  . Multiple Vitamin (MULTIVITAMIN WITH MINERALS) TABS tablet Take 1 tablet by mouth daily. Mega Men 50 Plus    . nitroGLYCERIN (NITROSTAT) 0.4 MG SL tablet Place 1 tablet (0.4 mg total) under the tongue every 5 (five) minutes x 3 doses as needed for chest pain. 30 tablet 12  . Oxycodone HCl 10 MG TABS Take 20 mg by mouth 3 (three) times daily.     . potassium chloride (K-DUR) 10 MEQ tablet Take 1 tablet (10 mEq total) by mouth daily. 30 tablet 6  . pregabalin (LYRICA) 75 MG capsule Take 75 mg by mouth 2 (two) times daily.    [name] ezetimibe (ZETIA) 10 MG tablet Take 1 tablet (10 mg total) by mouth daily. 30 tablet 6  . spironolactone (ALDACTONE) 25 MG tablet Take 0.5 tablets (12.5 mg total) by mouth daily. 30 tablet 2   No current facility-administered medications for this visit.    Objective: Office vital signs reviewed. BP 113/68 mmHg  Pulse 66  Temp(Src) 98.2 F (36.8 C) (Oral)  Resp 18  Ht 5\' 11"  (1.803 m)  Wt 188 lb 11.2 oz (85.594 kg)  BMI 26.33 kg/m2  SpO2 96%   Physical Examination:  General: Awake, alert, well nourished, NAD. Pleasant Cardio: RRR, II/VI systolic murmur. No rubs or gallops. JVP ~5cm, Not pitting edema. Carotid bruits noted b/l.  Pulm: No increased WOB.  CTAB, without wheezes, rhonchi or crackles noted.  GI: soft, NT/ND,+BS x4, no hepatomegaly, no splenomegaly. No hepatojugular reflux.  MSK: Normal gait and station Skin: dry, intact, no rashes or lesions   Assessment/Plan: No problem-specific assessment & plan notes found for this encounter.   No orders of the defined types were placed in this encounter.    Meds ordered this encounter  Medications  . potassium chloride (K-DUR) 10 MEQ tablet    Sig: Take 1 tablet (10 mEq total) by mouth daily.    Dispense:  30 tablet    Refill:  6  . isosorbide mononitrate (IMDUR) 60 MG 24 hr tablet    Sig: Take 1 tablet  (60 mg total) by mouth 2 (two) times daily.    Dispense:  30 tablet    Refill:  6  . hydrALAZINE (APRESOLINE) 50 MG tablet    Sig: Take 1.5 tablets (75 mg total) by mouth 3 (three) times daily.    Dispense:  135 tablet    Refill:  6  . furosemide (LASIX) 40 MG tablet    Sig: Take 1 tablet (40 mg total) by mouth 2 (two) times daily.    Dispense:  30 tablet    Refill:  6  . ezetimibe (ZETIA) 10 MG tablet    Sig: Take 1 tablet (10 mg total) by mouth daily.    Dispense:  30 tablet    Refill:  6  .  clopidogrel (PLAVIX) 75 MG tablet    Sig: Take 1 tablet (75 mg total) by mouth at bedtime.    Dispense:  30 tablet    Refill:  6  . carvedilol (COREG) 25 MG tablet    Sig: Take 1 tablet (25 mg total) by mouth 2 (two) times daily with a meal.    Dispense:  60 tablet    Refill:  6  . amLODipine (NORVASC) 5 MG tablet    Sig: Take 1 tablet (5 mg total) by mouth 2 (two) times daily.    Dispense:  60 tablet    Refill:  6    .  . spironolactone (ALDACTONE) 25 MG tablet    Sig: Take 0.5 tablets (12.5 mg total) by mouth daily.    Dispense:  30 tablet    Refill:  [place] PGY-2, [place]

## 2016-01-14 NOTE — Patient Instructions (Addendum)
It was to meet you. Continue your current regimen- you're doing well. Check your weight daily. If your weight increases by 3lbs in 1 day then take an extra Lasix.  Follow up with [facility] as scheduled- at that time you'll get more blood work.

## 2016-01-14 NOTE — Progress Notes (Signed)
Patient here for medication check  Patient denies pain at this time.

## 2016-01-14 NOTE — Assessment & Plan Note (Signed)
Patient with atypical chest pain. LHC discussed and would like to hold off. Discussed letting clinic know if symptoms become more frequent.  - continue Plavix and ASA - Can't take statins, on Zetia

## 2016-01-19 ENCOUNTER — Ambulatory Visit: Payer: Medicaid Other | Admitting: Cardiothoracic Surgery

## 2016-01-21 ENCOUNTER — Telehealth (HOSPITAL_COMMUNITY): Payer: Self-pay | Admitting: Vascular Surgery

## 2016-01-21 NOTE — Telephone Encounter (Signed)
Note faxed to d/c tele monitoring

## 2016-01-21 NOTE — Telephone Encounter (Signed)
Chad [name] would like a note faxed in to discontinue tele monitoring for this pt fax # [phone]

## 2016-01-26 ENCOUNTER — Ambulatory Visit: Payer: Medicaid Other | Admitting: Cardiothoracic Surgery

## 2016-01-28 ENCOUNTER — Ambulatory Visit: Payer: Medicaid Other | Admitting: Cardiothoracic Surgery

## 2016-02-02 ENCOUNTER — Ambulatory Visit: Payer: Medicaid Other | Admitting: Cardiothoracic Surgery

## 2016-02-09 ENCOUNTER — Other Ambulatory Visit (HOSPITAL_COMMUNITY): Payer: Self-pay | Admitting: Adult Health

## 2016-02-09 ENCOUNTER — Encounter: Payer: Self-pay | Admitting: *Deleted

## 2016-02-21 IMAGING — CR DG CHEST 1V PORT
1 series · 1 of 1 positions shown · non-contrast
Comparison: 03/06/2015 and earlier.

CLINICAL DATA: 54-year-old male with mid chest pain tonight.
Initial encounter.

EXAM:
PORTABLE CHEST 1 VIEW

[AP]
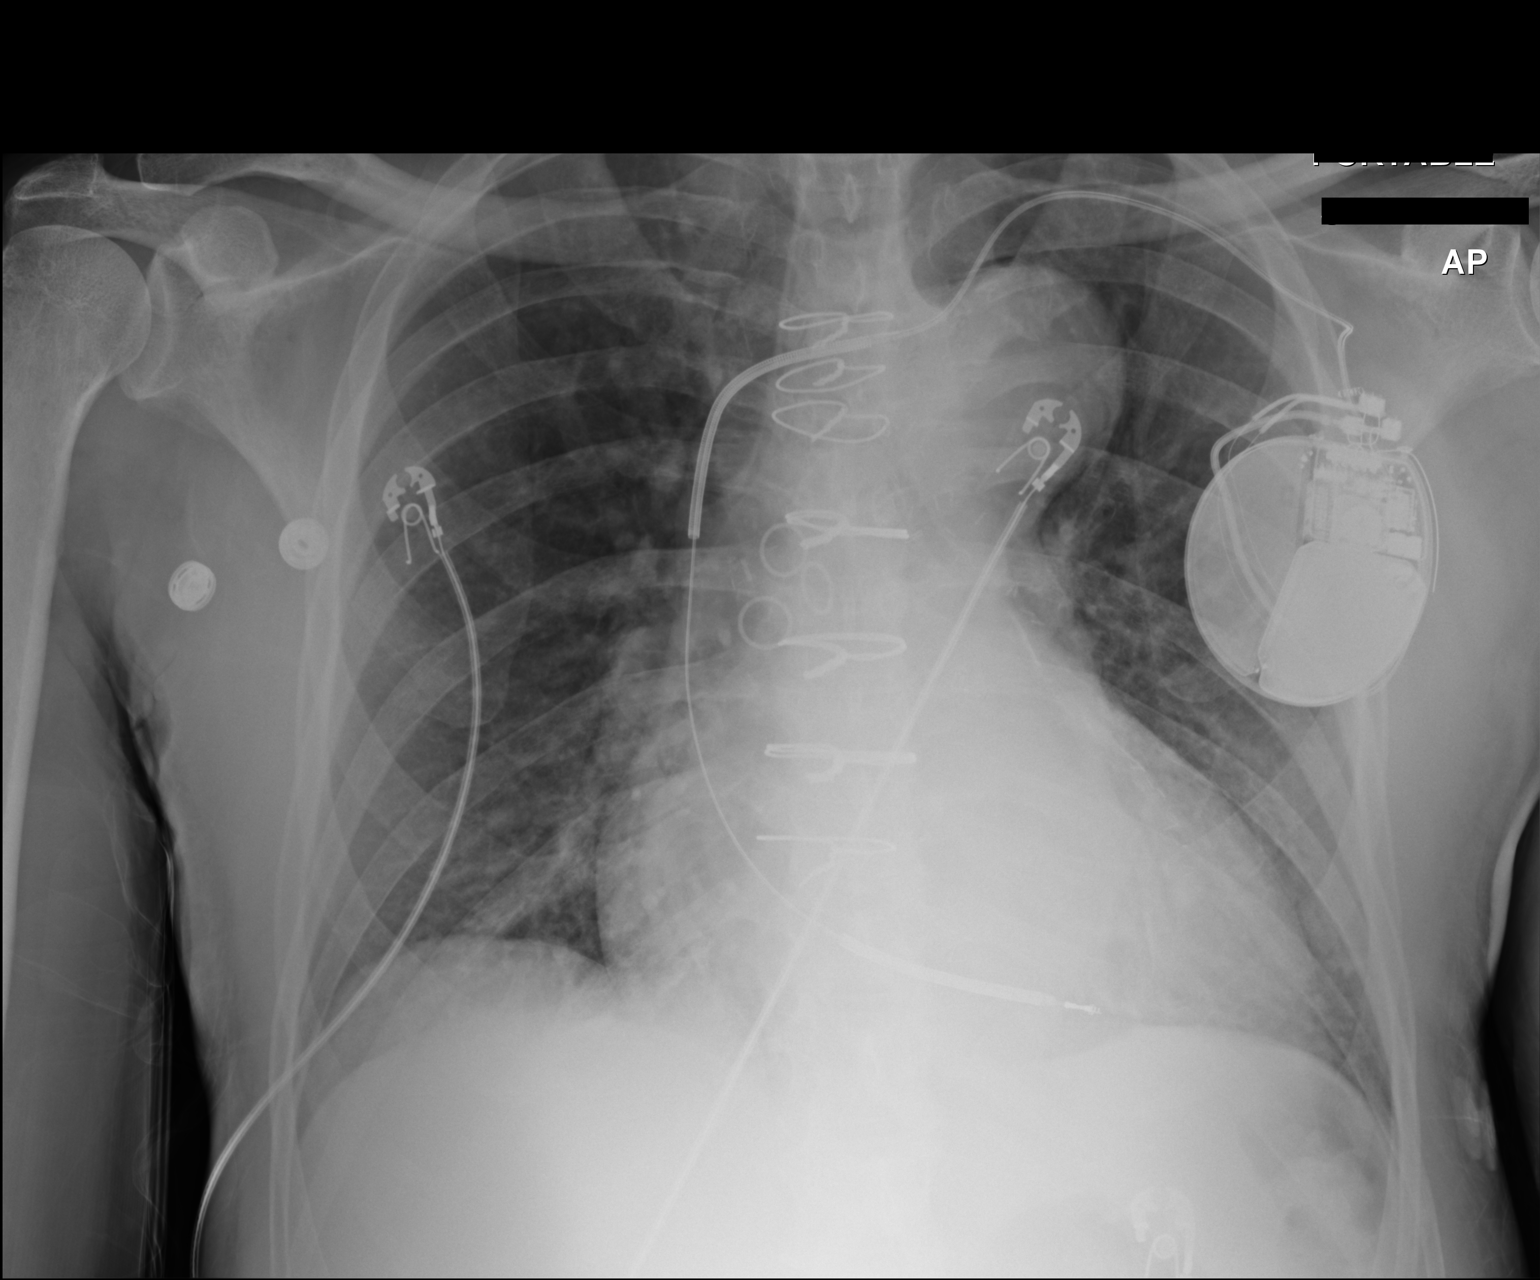

[1 of 1 positions shown; findings below may reference images not displayed]

FINDINGS: Portable AP semi upright view at 0407 hours. Mildly lower lung
volumes. Stable cardiomegaly and mediastinal contours. Left chest
cardiac AICD and sequelae of CABG. Tortuous thoracic aorta contours
stable. No pneumothorax, pulmonary edema, pleural effusion or
confluent pulmonary opacity. Stable surgical clips at the left
thoracic inlet.
IMPRESSION: Stable cardiomegaly. No acute cardiopulmonary abnormality.

## 2016-02-24 ENCOUNTER — Inpatient Hospital Stay (HOSPITAL_COMMUNITY): Admission: RE | Admit: 2016-02-24 | Payer: Medicaid Other | Source: Ambulatory Visit | Admitting: Internal Medicine

## 2016-03-27 ENCOUNTER — Other Ambulatory Visit (HOSPITAL_COMMUNITY): Payer: Self-pay | Admitting: Adult Health

## 2016-03-31 ENCOUNTER — Other Ambulatory Visit (HOSPITAL_COMMUNITY): Payer: Self-pay

## 2016-03-31 DIAGNOSIS — I5022 Chronic systolic (congestive) heart failure: Secondary | ICD-10-CM

## 2016-04-18 ENCOUNTER — Encounter: Payer: Self-pay | Admitting: Family Medicine

## 2016-04-18 ENCOUNTER — Ambulatory Visit (INDEPENDENT_AMBULATORY_CARE_PROVIDER_SITE_OTHER): Payer: Medicaid Other | Admitting: Family Medicine

## 2016-04-18 VITALS — BP 128/79 | HR 67 | Temp 97.8°F | Ht 71.0 in | Wt 188.0 lb

## 2016-04-18 DIAGNOSIS — I10 Essential (primary) hypertension: Secondary | ICD-10-CM

## 2016-04-18 DIAGNOSIS — N529 Male erectile dysfunction, unspecified: Secondary | ICD-10-CM | POA: Diagnosis not present

## 2016-04-18 DIAGNOSIS — R059 Cough, unspecified: Secondary | ICD-10-CM | POA: Insufficient documentation

## 2016-04-18 DIAGNOSIS — I5043 Acute on chronic combined systolic (congestive) and diastolic (congestive) heart failure: Secondary | ICD-10-CM | POA: Diagnosis not present

## 2016-04-18 DIAGNOSIS — R05 Cough: Secondary | ICD-10-CM | POA: Insufficient documentation

## 2016-04-18 MED ORDER — ISOSORBIDE MONONITRATE ER 60 MG PO TB24: 60.0000 mg | ORAL_TABLET | Freq: Two times a day (BID) | ORAL | Status: DC

## 2016-04-18 MED ORDER — ISOSORBIDE MONONITRATE ER 60 MG PO TB24
60.0000 mg | ORAL_TABLET | Freq: Two times a day (BID) | ORAL | Status: DC
Start: 2016-04-18 — End: 2016-05-25

## 2016-04-18 NOTE — Assessment & Plan Note (Signed)
Patient reports difficulty achieving erections for the past 4 months since starting Lyrica.  - Advised him discuss Lyrica, and alternative options with his pain management clinic - Declined prescriptions for Viagra and Cialis due to his current use of Imdur - Discussed referral to urology if erectile function persist after discontinuing Lyrica

## 2016-04-18 NOTE — Assessment & Plan Note (Signed)
Patient reports coughing with starting Norvasc.  - Discontinued Norvasc - Follow-up if cough persist after discontinuing medication

## 2016-04-18 NOTE — Assessment & Plan Note (Signed)
Blood pressure at goal today.  - Discontinue Norvasc; as he reports cough every time he starts this medication - Advised follow-up with his cardiologist or PCP for blood pressure recheck in 1-2 weeks - Long discussion about consistent compliance with blood pressure medications

## 2016-04-18 NOTE — Assessment & Plan Note (Signed)
Refilled Imdur 60 mg twice a day as prescribed at last cardiology visit - Advised him to reschedule with his cardiologist or additional refills as needed

## 2016-04-18 NOTE — Progress Notes (Signed)
  Patient name: Chad [name] MRN [id number]  Date of birth: 02-17-1961  CC & HPI:  Chad [name] is a 55 y.o. male presenting today for cough and erectile dysfunction.   Cough - He reports intermittent cough since he has been on Norvasc for the past several years - Reports cough resolves when he stops taking Norvasc - He takes this intermittently when his prescription for imdur runs out early - Denies chest pain, shortness of breath, palpitations, orthopnea, PND, smoking  Erectile dysfunction - He reports being able to achieve erection <  25% time - Previously had prescriptions for Cialis and Viagra which he never had to take - Reports worsening erectile dysfunction since starting Lyrica for nerve pain  Smoking History Noted  Objective Findings:  Vitals: BP 128/79 mmHg  Pulse 67  Temp(Src) 97.8 F (36.6 C) (Oral)  Ht 5\' 11"  (1.803 m)  Wt 188 lb (85.276 kg)  BMI 26.23 kg/m2  SpO2 97%  Gen: NAD CV: RRR w/o m/r/g, pulses +2 b/l Resp: CTAB w/ normal respiratory effort  Assessment & Plan:   Acute on chronic systolic and diastolic heart failure, NYHA class 3 Refilled Imdur 60 mg twice a day as prescribed at last cardiology visit - Advised him to reschedule with his cardiologist or additional refills as needed  HYPERTENSION, BENIGN Blood pressure at goal today.  - Discontinue Norvasc; as he reports cough every time he starts this medication - Advised follow-up with his cardiologist or PCP for blood pressure recheck in 1-2 weeks - Long discussion about consistent compliance with blood pressure medications  Cough Patient reports coughing with starting Norvasc.  - Discontinued Norvasc - Follow-up if cough persist after discontinuing medication  Erectile dysfunction Patient reports difficulty achieving erections for the past 4 months since starting Lyrica.  - Advised him discuss Lyrica, and alternative options with his pain management clinic - Declined prescriptions for  Viagra and Cialis due to his current use of Imdur - Discussed referral to urology if erectile function persist after discontinuing Lyrica

## 2016-04-22 IMAGING — DX DG CHEST 2V
2 series · 2 of 2 positions shown · non-contrast
Comparison: Radiographs 10/04/2015, CT 10/11/2014

CLINICAL DATA: Onset of chest pain while on a flight.

EXAM:
CHEST  2 VIEW

[chest pa]
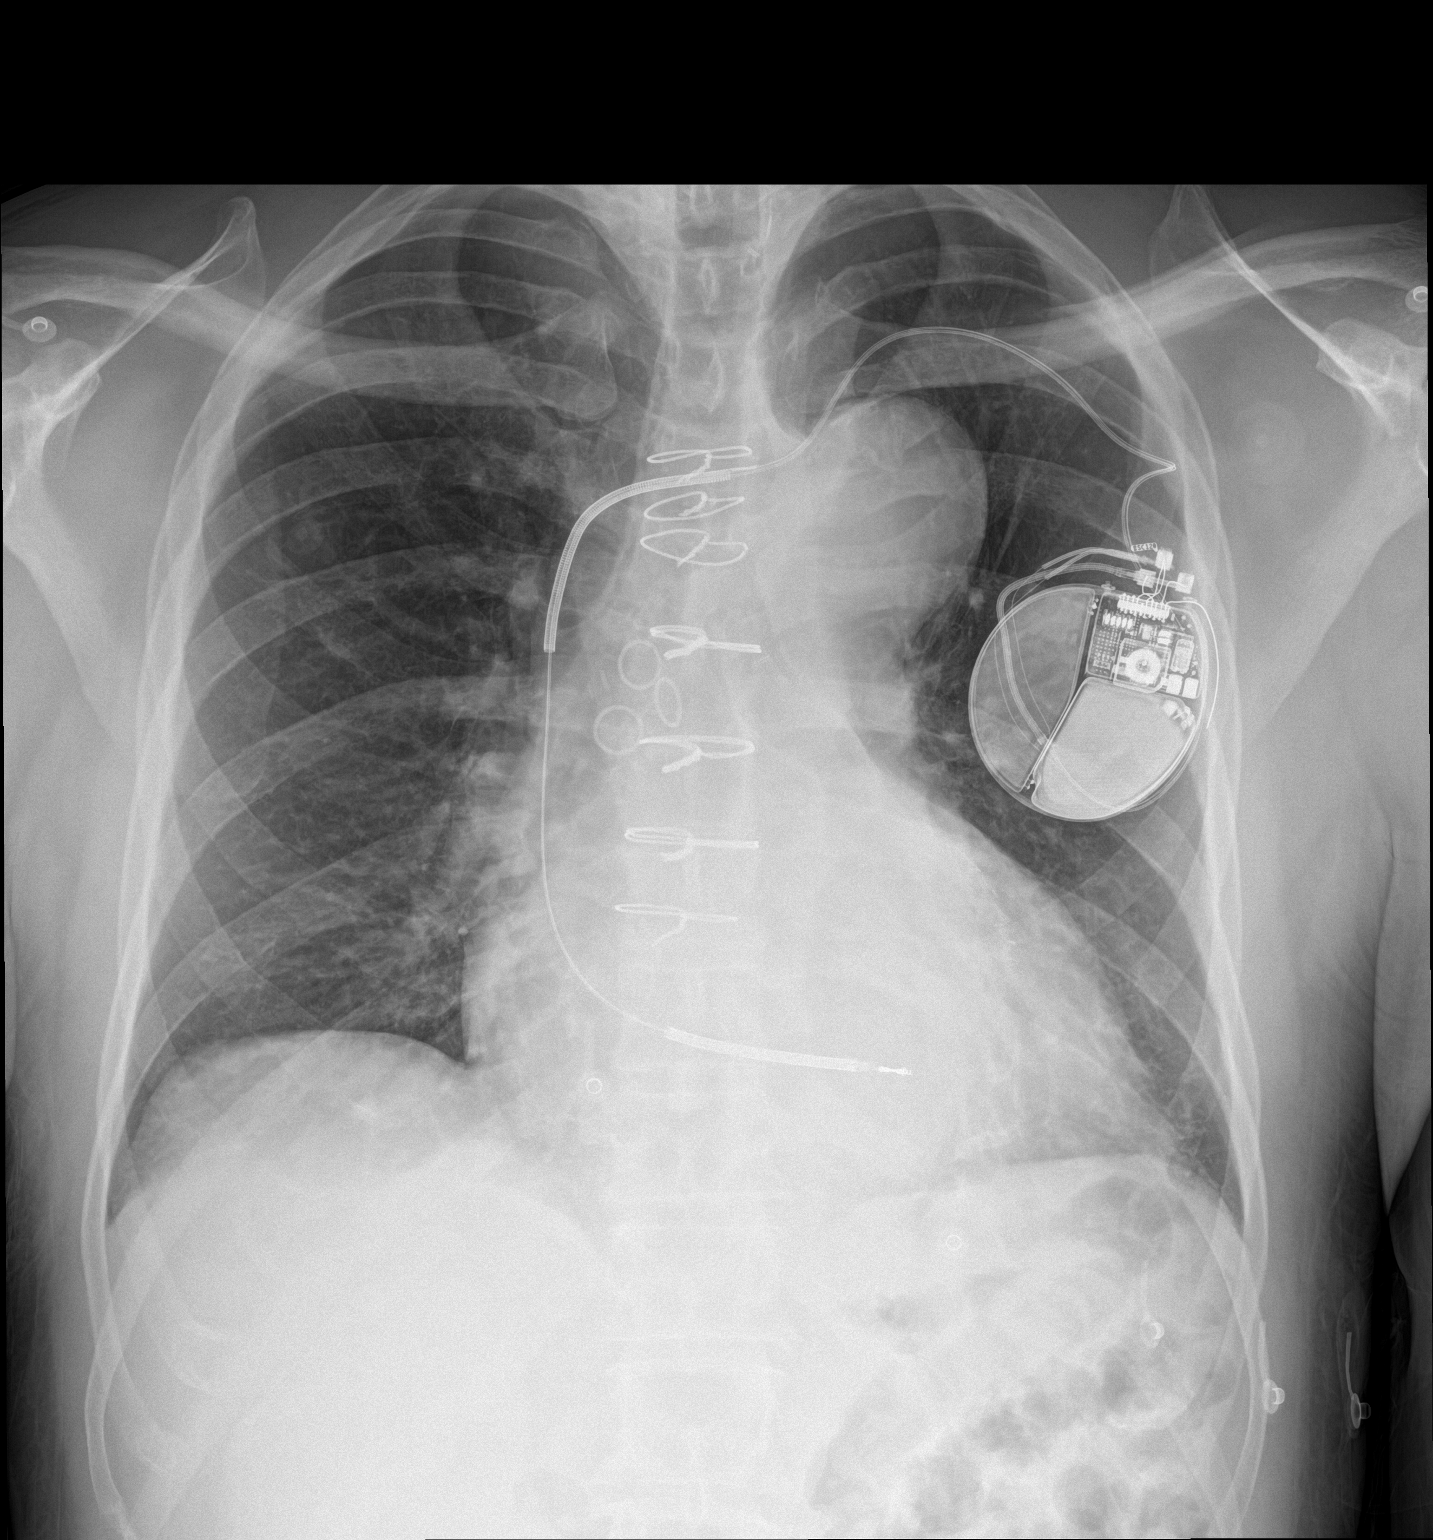

[chest lat]
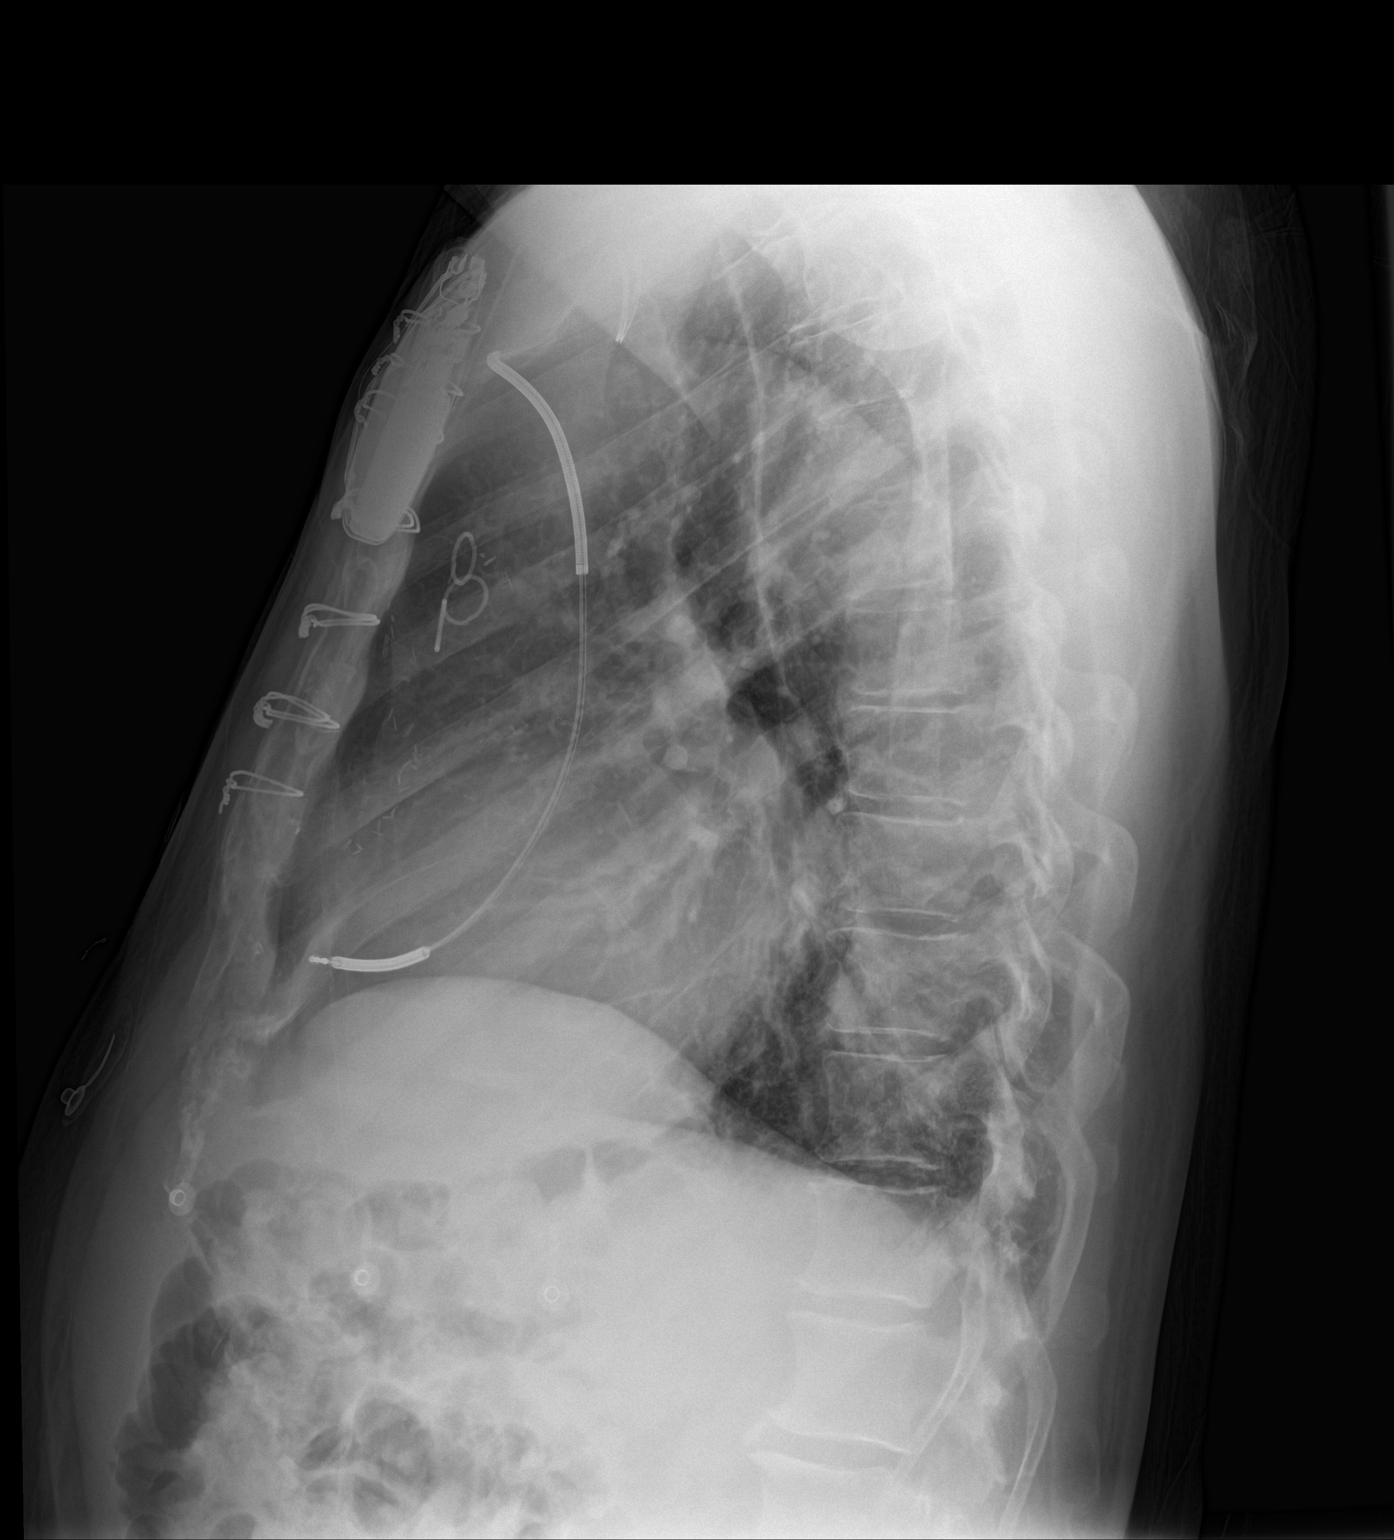

[2 of 2 positions shown; findings below may reference images not displayed]

FINDINGS: Stable cardiomegaly and mediastinal contours, patient is post repair
of the thoracic aortic dissection. Post median sternotomy with
single lead left-sided pacemaker in place. No pulmonary edema,
confluent airspace disease, pleural effusion or pneumothorax. No
acute osseous abnormalities are seen.
IMPRESSION: No acute process.  Stable cardiomegaly and mediastinal contours.

## 2016-04-23 IMAGING — CT CT ANGIO CHEST
1 of 9 series · 1 of 36 positions shown · IV contrast (Iohexol (Omnipaque 350))
Comparison: Most recent CTA 10/11/2014.  CT abdomen 03/21/2015

CLINICAL DATA: Chest pain. History of aortic dissection post repair
6 years prior.

EXAM:
CT ANGIOGRAPHY CHEST, ABDOMEN AND PELVIS
TECHNIQUE: Multidetector CT imaging through the chest, abdomen and pelvis was
performed using the standard protocol during bolus administration of
intravenous contrast. Multiplanar reconstructed images and MIPs were
obtained and reviewed to evaluate the vascular anatomy.
CONTRAST:  100 mL Omnipaque 350 IV

[Series 300: locator · axial · 0.49mm/px · 1 of 1 slices shown]
[im 1/1  lung]
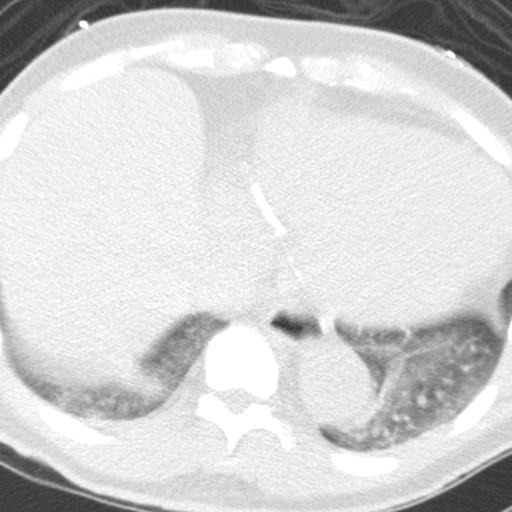

[1 of 36 positions shown; findings below may reference images not displayed]

FINDINGS: CTA CHEST FINDINGS

Aortic stent graft involving the ascending thoracic aorta which is
patent and unchanged in appearance from prior exam. The aortic root
measures 4.1 cm, previously 4.3 cm. Is cardiac motion through the
aortic root. Again noted is aortic dissection at the aortic arch
just be on the surgical graft. Overall appearance of the dissection
is unchanged from prior exam. There is no extension of dissection to
the great vessels from the aortic arch. The proximal descending
thoracic aorta measures 4.4 cm, previously 4.5 cm. Dissection
extends through the descending thoracic aorta with opacification of
the false and true lumen. Overall size and configuration is
unchanged. There is no aortic hematoma or surrounding soft tissue
stranding.

Multi chamber cardiomegaly with pacemaker, unchanged in appearance.
No adenopathy. Patulous esophagus. No filling defects in the central
pulmonary arteries. No pleural or pericardial effusion. Mild
dependent atelectasis in the lower lungs. No consolidation to
suggest pneumonia. Absent left thyroid tissue.

There are no acute or suspicious osseous abnormalities. Patient is
post median sternotomy.

Review of the MIP images confirms the above findings.

CTA ABDOMEN AND PELVIS FINDINGS

Aortic dissection involves the abdominal aorta through the common
iliac arteries bilaterally. Dissection extends to the celiac trunk.
Dissection involves the proximal SMA. IMA arises from the true
lumen. The abdominal aorta at the level of the celiac trunk measures
4 cm, unchanged. Distal abdominal aorta measures 3.2 cm, previously
2.8 cm. Dominant SMA branches are patent. Right renal artery
originates from the true lumen. Left renal artery originates from
the true lumen. Dissection does not involve the internal or external
iliac arteries or femoral arteries.

Arterial phase imaging of the liver, adrenal glands, and pancreas
are unremarkable. Stones or sludge in the gallbladder, physiologic
gallbladder distention. Small cyst in the spleen, not definitively
seen prior, however likely incidental. Chronic left renal atrophy
with only minimal enhancement of the lower pole. Cortical scarring
in the lower right kidney, unchanged. No right hydronephrosis.

Stomach is decompressed. There are no dilated or thickened bowel
loops. Moderate stool burden. Diverticulosis of the sigmoid colon
without diverticulitis. Appendix tentatively identified. Resolution
of previous pelvic free fluid. Fluid remains in both inguinal
canals.

IVC is patent.  No retroperitoneal adenopathy.

Urinary bladder is decompressed, however thick walled. Prostate
gland appears prominent.

Advanced degenerative change in the lower lumbar spine. There are no
acute or suspicious osseous abnormalities.

Review of the MIP images confirms the above findings.
IMPRESSION: 1. Post ascending aortic graft which is patent and unchanged in
configuration from prior exam. Configuration of aortic dissection
distal to the aortic graft extending from the arch through the
common iliac arteries is unchanged from prior. The size of the lower
abdominal aorta has progressed from prior exam, currently 3.2 cm,
previously 2.8 cm. No surrounding soft tissue stranding.
2. Thick-walled urinary bladder, recommend correlation with
urinalysis to exclude urinary tract infection. This appears greater
than expected allowing for degree of distension.
3. Chronic findings as described.

## 2016-04-25 ENCOUNTER — Telehealth (HOSPITAL_COMMUNITY): Payer: Self-pay

## 2016-04-25 NOTE — Telephone Encounter (Signed)
Patient left VM on CHF clinic triage line c/o imdur dosage, stating this family medicine refilled it under a wrong dosage. In checking through chart, our records indicate he should be taking imdur 60 mg bid per last OV in January with Amy Clegg NP-C. Last refill in charts shows that he is on imdur 60mg  bid as well. Left returning VM stating that this was correct and to return our call to see what he has been doing since he believes this to be incorrect. Also, patient mentioned being out x 1 week. Refill for this medication at correct dosage sent electronically to preferred pharmacy as listed in patient's chart.  Renee Pain

## 2016-04-26 ENCOUNTER — Telehealth (HOSPITAL_COMMUNITY): Payer: Self-pay | Admitting: Vascular Surgery

## 2016-04-26 NOTE — Telephone Encounter (Signed)
Chad [name] called pt has not has his Isoribe in 2 weeks please advise

## 2016-04-27 NOTE — Telephone Encounter (Signed)
Per chart rx for Isosorbide was sent in 4/25 by pcp at [place].  Per Vaughan Basta pt has been to the pharmacy but there is an "issue" she is not sure what, but pt has not been able to get med filled.  Will contact pharmacy for more info and get back to her.  She does state pt has been out of med for 2 weeks

## 2016-04-27 NOTE — Telephone Encounter (Signed)
Per [facility] med was placed on HOLD per their protocol when they receive e-scripts from [place] they put them on HOLD until pt tells them to fill.  They have released RX and said pt can p/u later today.  Attempted to call Vaughan Basta back and left her a mess med can be picked up today

## 2016-05-01 ENCOUNTER — Encounter: Payer: Self-pay | Admitting: Family Medicine

## 2016-05-01 ENCOUNTER — Other Ambulatory Visit (HOSPITAL_COMMUNITY): Payer: Self-pay | Admitting: *Deleted

## 2016-05-01 ENCOUNTER — Ambulatory Visit (INDEPENDENT_AMBULATORY_CARE_PROVIDER_SITE_OTHER): Payer: Medicaid Other | Admitting: Family Medicine

## 2016-05-01 ENCOUNTER — Ambulatory Visit (HOSPITAL_COMMUNITY): Payer: Medicaid Other | Attending: Family Medicine

## 2016-05-01 VITALS — BP 122/79 | HR 77 | Temp 98.1°F | Wt 184.0 lb

## 2016-05-01 DIAGNOSIS — R9431 Abnormal electrocardiogram [ECG] [EKG]: Secondary | ICD-10-CM | POA: Diagnosis not present

## 2016-05-01 DIAGNOSIS — I2510 Atherosclerotic heart disease of native coronary artery without angina pectoris: Secondary | ICD-10-CM

## 2016-05-01 DIAGNOSIS — I25700 Atherosclerosis of coronary artery bypass graft(s), unspecified, with unstable angina pectoris: Secondary | ICD-10-CM

## 2016-05-01 DIAGNOSIS — C73 Malignant neoplasm of thyroid gland: Secondary | ICD-10-CM | POA: Diagnosis not present

## 2016-05-01 DIAGNOSIS — R5383 Other fatigue: Secondary | ICD-10-CM | POA: Diagnosis present

## 2016-05-01 DIAGNOSIS — I5022 Chronic systolic (congestive) heart failure: Secondary | ICD-10-CM

## 2016-05-01 LAB — CBC
HCT: 41 % (ref 38.5–50.0)
Hemoglobin: 13.3 g/dL (ref 13.2–17.1)
MCH: 27.4 pg (ref 27.0–33.0)
MCHC: 32.4 g/dL (ref 32.0–36.0)
MCV: 84.4 fL (ref 80.0–100.0)
MPV: 12 fL (ref 7.5–12.5)
PLATELETS: 160 10*3/uL (ref 140–400)
RBC: 4.86 MIL/uL (ref 4.20–5.80)
RDW: 14.7 % (ref 11.0–15.0)
WBC: 4.1 10*3/uL (ref 3.8–10.8)

## 2016-05-01 MED ORDER — CLOPIDOGREL BISULFATE 75 MG PO TABS: 75.0000 mg | ORAL_TABLET | Freq: Every day | ORAL | Status: DC

## 2016-05-01 NOTE — Patient Instructions (Signed)
There are many reasons why you could be fatigued. I will call when I get results from your blood work. You will also hear from [place] in a few days about an ultrasound of your thyroid.

## 2016-05-02 ENCOUNTER — Other Ambulatory Visit (HOSPITAL_COMMUNITY): Payer: Medicaid Other

## 2016-05-02 ENCOUNTER — Telehealth (HOSPITAL_COMMUNITY): Payer: Self-pay

## 2016-05-02 ENCOUNTER — Ambulatory Visit (HOSPITAL_COMMUNITY)
Admission: RE | Admit: 2016-05-02 | Discharge: 2016-05-02 | Disposition: A | Discharge status: Home / Self Care | Payer: Medicaid Other | Source: Ambulatory Visit | Attending: Family Medicine | Admitting: Family Medicine

## 2016-05-02 DIAGNOSIS — I2510 Atherosclerotic heart disease of native coronary artery without angina pectoris: Secondary | ICD-10-CM | POA: Insufficient documentation

## 2016-05-02 DIAGNOSIS — R9431 Abnormal electrocardiogram [ECG] [EKG]: Secondary | ICD-10-CM | POA: Insufficient documentation

## 2016-05-02 DIAGNOSIS — I5022 Chronic systolic (congestive) heart failure: Secondary | ICD-10-CM | POA: Insufficient documentation

## 2016-05-02 LAB — COMPLETE METABOLIC PANEL WITH GFR
ALBUMIN: 4 g/dL (ref 3.6–5.1)
ALK PHOS: 63 U/L (ref 40–115)
ALT: 10 U/L (ref 9–46)
AST: 12 U/L (ref 10–35)
BUN: 15 mg/dL (ref 7–25)
CALCIUM: 8.9 mg/dL (ref 8.6–10.3)
CHLORIDE: 102 mmol/L (ref 98–110)
CO2: 27 mmol/L (ref 20–31)
Creat: 1.34 mg/dL — ABNORMAL HIGH (ref 0.70–1.33)
GFR, EST AFRICAN AMERICAN: 69 mL/min (ref >=60)
GFR, EST NON AFRICAN AMERICAN: 60 mL/min (ref >=60)
Glucose, Bld: 103 mg/dL — ABNORMAL HIGH (ref 65–99)
POTASSIUM: 4 mmol/L (ref 3.5–5.3)
Sodium: 138 mmol/L (ref 135–146)
Total Bilirubin: 0.5 mg/dL (ref 0.2–1.2)
Total Protein: 6.8 g/dL (ref 6.1–8.1)

## 2016-05-02 LAB — HIV ANTIBODY (ROUTINE TESTING W REFLEX): HIV: NONREACTIVE

## 2016-05-02 LAB — HEPATITIS C ANTIBODY: HCV AB: NEGATIVE

## 2016-05-02 LAB — TROPONIN I: Troponin I: <0.01 ng/mL (ref <=0.05)

## 2016-05-02 LAB — PRO B NATRIURETIC PEPTIDE: PRO B NATRI PEPTIDE: 2614 pg/mL — AB (ref <126)

## 2016-05-02 LAB — TSH: TSH: 2.55 mIU/L (ref 0.40–4.50)

## 2016-05-02 NOTE — Telephone Encounter (Signed)
Patient needing plavix refilled. Advised this was sent in yesterday. Will check on this and call us back if not at pharmacy.  Renee Pain

## 2016-05-02 NOTE — Assessment & Plan Note (Addendum)
Pt presents for fatigue x3-4 months, some intermittent dyspnea, no fevers or weight loss, known EF of 20% and CAD, CKD so has many reasons to feel fatigued but reports this is new and different - EKG grossly abnormal but no significant change from prior - trop neg, proBNP up to 2614 (previously around 1000) - creat better than baseline at 1.3, otherwise CMP, CBC, TSH, hep C, HIV, RPR normal - will check thyroid ultrasound to eval for cancer recurrence - likely fatigue is related to his heart failure and CAD, recommend f/u with HF team as he no showed in March and hasn't seen them since January which is when his symptoms started - takes lasix prn normally, rec he take scheduled for the next four days due to his clinical hypervolemia and elevated proBNP - could also consider sleep study as OSA may be contributing

## 2016-05-02 NOTE — Progress Notes (Signed)
   Subjective:   Chad [name] is a 55 y.o. male with a history of HFrEF, CAD, CKD, thyroid cancer s/p resection, AAA here for fatigue  Patient reports that since January he has been very tired all the time. He doesn't have energy to do anything and sometimes falls asleep during conversations (although he reports that part is improving). He also reports some cough and dyspnea off and on during that time that he associated with amlodipine that he was taken off of 2 days ago. He denies any weight loss, fevers, chills, night sweats, dysphagia. He reports having a lot of trouble sleeping but says that is not new, denies depression but does endorse some anxiety about his health. Stable orthopnea, no PND. Does snore and stops breathing during the night according to his bed partner but he reports he has done this all his life so he doesn't think it's related.  Review of Systems:  Per HPI. All other systems reviewed and are negative.   PMH, PSH, Medications, Allergies, and FmHx reviewed and updated in EMR.  Social History: never smoker  Objective:  BP 122/79 mmHg  Pulse 77  Temp(Src) 98.1 F (36.7 C) (Oral)  Wt 184 lb (83.462 kg)  Gen:  55 y.o. male in NAD HEENT: NCAT, MMM, anicteric sclerae CV: RRR, no MRG Resp: Non-labored, bibasilar crackles but otherwise CTAB, no wheezes noted Abd: Soft, NTND, BS present, no guarding or organomegaly Ext: WWP, no edema MSK: Full ROM, strength intact Neuro: Alert and oriented, speech normal      Chemistry      Component Value Date/Time   NA 138 05/01/2016 1618   K 4.0 05/01/2016 1618   CL 102 05/01/2016 1618   CO2 27 05/01/2016 1618   BUN 15 05/01/2016 1618   CREATININE 1.34* 05/01/2016 1618   CREATININE 1.53* 01/13/2016 1139      Component Value Date/Time   CALCIUM 8.9 05/01/2016 1618   ALKPHOS 63 05/01/2016 1618   AST 12 05/01/2016 1618   ALT 10 05/01/2016 1618   BILITOT 0.5 05/01/2016 1618      Lab Results  Component Value Date   WBC 4.1 05/01/2016   HGB 13.3 05/01/2016   HCT 41.0 05/01/2016   MCV 84.4 05/01/2016   PLT 160 05/01/2016   Lab Results  Component Value Date   TSH 2.55 05/01/2016   Assessment & Plan:     Chad [name] is a 55 y.o. male here for fatigue  Thyroid cancer, minimally invasive Hurthle cell carcinoma Pt with h/o thyroid cancer s/p resection years ago now with severe fatigue for several months, no fevers, endorses dyspnea but no dysphagia - check TSH, thyroid ultrasound to eval for cancer recurrence or spread  Fatigue Pt presents for fatigue x3-4 months, some intermittent dyspnea, no fevers or weight loss, known EF of 20% and CAD, CKD so has many reasons to feel fatigued but reports this is new and different - EKG grossly abnormal but no significant change from prior - trop neg, proBNP up to 2614 (previously around 1000) - creat better than baseline at 1.3, otherwise CMP, CBC, TSH, hep C, HIV, RPR normal - will check thyroid ultrasound to eval for cancer recurrence - likely fatigue is related to his heart failure and CAD, recommend f/u with HF team as he no showed in March and hasn't seen them since January which is when his symptoms started    Beverlyn Roux, MD, MPH [facility] PGY-3 05/02/2016 10:00 AM

## 2016-05-02 NOTE — Assessment & Plan Note (Signed)
Pt with h/o thyroid cancer s/p resection years ago now with severe fatigue for several months, no fevers, endorses dyspnea but no dysphagia - check TSH, thyroid ultrasound to eval for cancer recurrence or spread

## 2016-05-12 ENCOUNTER — Ambulatory Visit (INDEPENDENT_AMBULATORY_CARE_PROVIDER_SITE_OTHER): Payer: Medicaid Other | Admitting: Family Medicine

## 2016-05-12 ENCOUNTER — Ambulatory Visit (HOSPITAL_COMMUNITY)
Admission: RE | Admit: 2016-05-12 | Discharge: 2016-05-12 | Disposition: A | Discharge status: Home / Self Care | Payer: Medicaid Other | Source: Ambulatory Visit | Attending: Family Medicine | Admitting: Family Medicine

## 2016-05-12 ENCOUNTER — Other Ambulatory Visit (HOSPITAL_COMMUNITY): Payer: Medicaid Other

## 2016-05-12 ENCOUNTER — Encounter: Payer: Self-pay | Admitting: Family Medicine

## 2016-05-12 VITALS — BP 136/85 | HR 75 | Temp 98.6°F | Ht 71.0 in | Wt 183.0 lb

## 2016-05-12 DIAGNOSIS — R5383 Other fatigue: Secondary | ICD-10-CM | POA: Diagnosis not present

## 2016-05-12 DIAGNOSIS — R0600 Dyspnea, unspecified: Secondary | ICD-10-CM | POA: Diagnosis present

## 2016-05-12 DIAGNOSIS — C73 Malignant neoplasm of thyroid gland: Secondary | ICD-10-CM | POA: Diagnosis present

## 2016-05-12 DIAGNOSIS — E041 Nontoxic single thyroid nodule: Secondary | ICD-10-CM | POA: Diagnosis not present

## 2016-05-12 MED ORDER — HYDRALAZINE HCL 50 MG PO TABS: 75.0000 mg | ORAL_TABLET | Freq: Three times a day (TID) | ORAL | Status: DC

## 2016-05-12 MED ORDER — AEROCHAMBER PLUS MISC: Status: DC

## 2016-05-12 MED ORDER — ALBUTEROL SULFATE HFA 108 (90 BASE) MCG/ACT IN AERS: 2.0000 | INHALATION_SPRAY | Freq: Four times a day (QID) | RESPIRATORY_TRACT | Status: DC | PRN

## 2016-05-12 MED ORDER — SPIRONOLACTONE 25 MG PO TABS: 12.5000 mg | ORAL_TABLET | Freq: Every day | ORAL | Status: DC

## 2016-05-12 NOTE — Patient Instructions (Signed)
Please schedule plain with your cardiologist within the next week.  - Fitted to the emergency room immediately if you develop any chest pain, palpitations, or worsening shortness of breath

## 2016-05-12 NOTE — Assessment & Plan Note (Signed)
Worsening dyspnea with some complaints of orthopnea, most concerning for worsening heart failure or pulmonary artery hypertension.  Significant history of CAD status post bypass graft as well as HFrEF and aortic dissection - Recent hemoglobin within normal limits and creatinine at baseline.  No signs of pulmonary embolism: Not tachycardic or hypoxic. Denies chest pain - Strongly recommended patient follow-up with cardiologist within the next week - Refilled spironolactone and hydralazine which had been out of - Provided albuterol and spacer at patient request given history of asthma; however, do not think this is driving his symptoms and no wheezing on exam

## 2016-05-12 NOTE — Progress Notes (Signed)
   Subjective:    Patient ID: Chad [name], male    DOB: Mar 03, 1961, 55 y.o.   MRN: [id number]  Seen for Same day visit for   CC: Dyspnea  He reports worsening dyspnea over the past several weeks to months.  Feels like he is also short of breath and having difficulty "getting air in his lungs."  He reports occasional orthopnea, especially when lying on right side.  Denies PND or lower extremity swelling.  Denies chest pain or palpitations.  Reports he has been out of his spironolactone as well as hydralazine.  Has not been seen by cardiology since January.  He reports history of asthma as a child and request albuterol.  Reports increased seasonal allergies for the past few weeks.   Review of Systems   See HPI for ROS. Objective:  BP 136/85 mmHg  Pulse 75  Temp(Src) 98.6 F (37 C) (Oral)  Ht 5\' 11"  (1.803 m)  Wt 183 lb (83.008 kg)  BMI 25.53 kg/m2  SpO2 97%  General: NAD Cardiac: RRR, 3/6 systolic murmur radiating to neck; radial pulses 2+ bilaterally Respiratory: CTAB, normal effort. No wheezes, rhonchi or crackles Abdomen: soft, nontender, nondistended, no hepatic or splenomegaly. Bowel sounds present Extremities: trace edema WWP. Skin: warm and dry, no rashes noted Neuro: alert and oriented, no focal deficits    Assessment & Plan:   Dyspnea Worsening dyspnea with some complaints of orthopnea, most concerning for worsening heart failure or pulmonary artery hypertension.  Significant history of CAD status post bypass graft as well as HFrEF and aortic dissection - Recent hemoglobin within normal limits and creatinine at baseline.  No signs of pulmonary embolism: Not tachycardic or hypoxic. Denies chest pain - Strongly recommended patient follow-up with cardiologist within the next week - Refilled spironolactone and hydralazine which had been out of - Provided albuterol and spacer at patient request given history of asthma; however, do not think this is driving his symptoms and  no wheezing on exam

## 2016-05-15 ENCOUNTER — Telehealth: Payer: Self-pay | Admitting: *Deleted

## 2016-05-15 NOTE — Telephone Encounter (Signed)
Pt informed. Wants to know what he should do about him not having any energy. Please advise. Deseree Kennon Holter, CMA

## 2016-05-15 NOTE — Progress Notes (Signed)
Quick Note:  Please inform patient of normal thyroid ultrasound. No evidence of cancer recurrence. Thanks! ______

## 2016-05-15 NOTE — Telephone Encounter (Signed)
-----   Message from Frazier Richards, MD sent at 05/15/2016  8:14 AM EDT ----- Please inform patient of normal thyroid ultrasound. No evidence of cancer recurrence. Thanks!

## 2016-05-25 ENCOUNTER — Other Ambulatory Visit (HOSPITAL_COMMUNITY): Payer: Self-pay | Admitting: *Deleted

## 2016-05-25 ENCOUNTER — Other Ambulatory Visit: Payer: Self-pay | Admitting: Family Medicine

## 2016-05-25 DIAGNOSIS — I5043 Acute on chronic combined systolic (congestive) and diastolic (congestive) heart failure: Secondary | ICD-10-CM

## 2016-05-25 DIAGNOSIS — I10 Essential (primary) hypertension: Secondary | ICD-10-CM

## 2016-05-25 MED ORDER — ISOSORBIDE MONONITRATE ER 60 MG PO TB24: 60.0000 mg | ORAL_TABLET | Freq: Two times a day (BID) | ORAL | Status: DC

## 2016-06-05 ENCOUNTER — Telehealth (HOSPITAL_COMMUNITY): Payer: Self-pay | Admitting: Cardiology

## 2016-06-05 MED ORDER — NITROGLYCERIN 0.4 MG SL SUBL: 0.4000 mg | SUBLINGUAL_TABLET | SUBLINGUAL | Status: DC | PRN

## 2016-06-05 NOTE — Telephone Encounter (Signed)
Patients s/o called to request refills

## 2016-07-10 ENCOUNTER — Other Ambulatory Visit: Payer: Self-pay | Admitting: Family Medicine

## 2016-07-10 NOTE — Telephone Encounter (Signed)
Refilled Aldactone. Please have pt fu with me in approximately 2 months.  Thanks, [name], MD [facility] Resident  07/10/2016, 9:05 AM

## 2016-07-12 ENCOUNTER — Ambulatory Visit: Payer: Medicaid Other | Admitting: Cardiothoracic Surgery

## 2016-07-21 ENCOUNTER — Encounter (HOSPITAL_COMMUNITY): Payer: Medicaid Other | Admitting: Internal Medicine

## 2016-07-25 ENCOUNTER — Encounter (HOSPITAL_COMMUNITY): Payer: Medicaid Other

## 2016-07-27 ENCOUNTER — Encounter (HOSPITAL_COMMUNITY): Payer: Self-pay | Admitting: Internal Medicine

## 2016-07-27 ENCOUNTER — Ambulatory Visit (HOSPITAL_COMMUNITY)
Admission: RE | Admit: 2016-07-27 | Discharge: 2016-07-27 | Disposition: A | Discharge status: Home / Self Care | Payer: Medicaid Other | Source: Ambulatory Visit | Attending: Internal Medicine | Admitting: Internal Medicine

## 2016-07-27 VITALS — BP 112/74 | Ht 71.0 in | Wt 178.8 lb

## 2016-07-27 DIAGNOSIS — Z882 Allergy status to sulfonamides status: Secondary | ICD-10-CM | POA: Insufficient documentation

## 2016-07-27 DIAGNOSIS — Z955 Presence of coronary angioplasty implant and graft: Secondary | ICD-10-CM | POA: Insufficient documentation

## 2016-07-27 DIAGNOSIS — E785 Hyperlipidemia, unspecified: Secondary | ICD-10-CM | POA: Diagnosis not present

## 2016-07-27 DIAGNOSIS — Z7902 Long term (current) use of antithrombotics/antiplatelets: Secondary | ICD-10-CM | POA: Diagnosis not present

## 2016-07-27 DIAGNOSIS — Z9119 Patient's noncompliance with other medical treatment and regimen: Secondary | ICD-10-CM | POA: Insufficient documentation

## 2016-07-27 DIAGNOSIS — Z7982 Long term (current) use of aspirin: Secondary | ICD-10-CM | POA: Insufficient documentation

## 2016-07-27 DIAGNOSIS — I10 Essential (primary) hypertension: Secondary | ICD-10-CM

## 2016-07-27 DIAGNOSIS — Z79899 Other long term (current) drug therapy: Secondary | ICD-10-CM | POA: Insufficient documentation

## 2016-07-27 DIAGNOSIS — I2510 Atherosclerotic heart disease of native coronary artery without angina pectoris: Secondary | ICD-10-CM | POA: Insufficient documentation

## 2016-07-27 DIAGNOSIS — I252 Old myocardial infarction: Secondary | ICD-10-CM | POA: Insufficient documentation

## 2016-07-27 DIAGNOSIS — I130 Hypertensive heart and chronic kidney disease with heart failure and stage 1 through stage 4 chronic kidney disease, or unspecified chronic kidney disease: Secondary | ICD-10-CM | POA: Insufficient documentation

## 2016-07-27 DIAGNOSIS — N189 Chronic kidney disease, unspecified: Secondary | ICD-10-CM | POA: Diagnosis not present

## 2016-07-27 DIAGNOSIS — Z91040 Latex allergy status: Secondary | ICD-10-CM | POA: Insufficient documentation

## 2016-07-27 DIAGNOSIS — N182 Chronic kidney disease, stage 2 (mild): Secondary | ICD-10-CM

## 2016-07-27 DIAGNOSIS — Z91013 Allergy to seafood: Secondary | ICD-10-CM | POA: Insufficient documentation

## 2016-07-27 DIAGNOSIS — Z95810 Presence of automatic (implantable) cardiac defibrillator: Secondary | ICD-10-CM | POA: Insufficient documentation

## 2016-07-27 DIAGNOSIS — Z85850 Personal history of malignant neoplasm of thyroid: Secondary | ICD-10-CM | POA: Insufficient documentation

## 2016-07-27 DIAGNOSIS — Z951 Presence of aortocoronary bypass graft: Secondary | ICD-10-CM | POA: Diagnosis not present

## 2016-07-27 DIAGNOSIS — Z888 Allergy status to other drugs, medicaments and biological substances status: Secondary | ICD-10-CM | POA: Diagnosis not present

## 2016-07-27 DIAGNOSIS — I5022 Chronic systolic (congestive) heart failure: Secondary | ICD-10-CM | POA: Insufficient documentation

## 2016-07-27 NOTE — Progress Notes (Signed)
Advanced Heart Failure Clinic Note   Patient ID: Chad [name], male   DOB: 1961-06-02, 55 y.o.   MRN: [id number]   History of Present Illness: Primary Cardiologist:  Dr. Demetrios Loll is a 55 y.o. male with history of severe HTN, coronary artery disease status post previous myocardial infarction and bypass surgery in 2006 with DES to native PDA in 2011.  He also has a history congestive heart failure secondary to ischemic cardiomyopathy   He is s/p single chamber [employer] ICD.  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 He underwent emergent repair of proximal aorta and reimplantation of his CABG grafts.  Carotids u/s in 2011 0-39%.  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 Also with significant low back pain s/p 2 surgeries.   Admitted December 2016 with chest pain. Had myoview with minimal changes noted. He was later discharged.   He returns for HF follow up. Overall feeling ok. Says he feels better after tumeric curcumin and flax seed oil. Says he has not had chest pain over the last few weeks. Mild dyspnea with steps. Denies PND/orthopnea. No bleeding problems. Working 45 hours a week. Walking 30 minutes a day. Appetite ok. Taking all medications.    ETT/Myoview in 9/12: Walked 8:46 on treadmill. Moderate-sized fixed inferior perfusion defect consistent with prior MI. EF 31% with septal hypokinesis. There was also inferior hypokinesis. Similar to prior study.   Myoview 01/2015 - High risk stress nuclear study with chest pain but no new electrocardiographic changes. The scintigraphic results show a large prior inferior infarct with very mild peri-infarct ischemia. The gated ejection fraction was 27% with global hypokinesis and inferior akinesis. Study felt to be high risk because of reduced RV function  11/2015 Myoview.  Evidence for reversibility and ischemia involving the anterolateral wall and a  portion of the lateral wall.. Large infarct involving the inferior wall. There is dyskinesia in the inferior wall. Left ventricular ejection fraction is 32%. Left ventricle dilatation. High-risk stress test findings*.  10/22/2015: ECHO EF 20-25% 11/26/12 ECHO EF 35-40%  Labs 11/25/13 Cholesterol 186 TG 121 HDL 49  Labs 12/09/13 K 3.6 creatinine 1.3  Labs 3/15 K 4.1, creatinine 1.4, BNP 589, HCT 39.8 Labs 3/16 K 3.8, creatinine 1.56, HCT 36.5 Labs 12/07/2015: K 3.6 Creatinine 1.37  Labs 05/01/2016: K 4.0 Creatinine 1.34   ROS: All pertinent positives and negatives as in HPI, otherwise negative.    Past Medical History:  Diagnosis Date  . AICD (automatic cardioverter/defibrillator) present   . Anemia   . Aortic dissection, thoracoabdominal ([place])    7/10: Type I s/p repair  . CAD (coronary artery disease)    a. s/p CABG 2006;  b. DES to PDA 2011 (cath: Dx not seen, dRCA/PDA tx with DES; S-PDA occluded (culprit), S-Dx occluded, S-RI and OM ok, L-LAD ok  . Carotid stenosis    dopplers 2011: 0-39% bilat.  . Chest pain syndrome   . CHF (congestive heart failure) ([place])   . Chronic systolic heart failure (HCC)    a. 12/13 ECHO: EF 35-40%, sept, apical & posterobasal HK, LV mod dil & sys fx mod reduced, mild AI, MV mild reg, TV mild reg  . CRI (chronic renal insufficiency)   . Gout   . HLD (hyperlipidemia)   . HTN (hypertension)    severe  . Thyroid cancer ([place])    [employer]    Current Outpatient Prescriptions  Medication Sig Dispense Refill  . albuterol (PROVENTIL HFA;VENTOLIN HFA) 108 (90 Base) MCG/ACT inhaler Inhale 2 puffs into the lungs every 6 (six) hours as needed for wheezing or shortness of breath. 1 Inhaler 0  . aspirin EC 81 MG tablet Take 1 tablet (81 mg total) by mouth daily. 90 tablet 3  . carvedilol (COREG) 25 MG tablet Take 1 tablet (25 mg total) by mouth 2 (two) times daily with a meal. 60 tablet 6  . clopidogrel (PLAVIX) 75 MG tablet Take 1 tablet (75 mg total) by  mouth daily. 30 tablet 3  . cyclobenzaprine (FLEXERIL) 10 MG tablet Take 10 mg by mouth daily as needed for muscle spasms.     . diphenhydrAMINE (BENADRYL) 25 MG tablet Take 12.5 mg by mouth 2 (two) times daily as needed for itching or allergies. Reported on 01/13/2016    . Flaxseed, Linseed, (FLAXSEED OIL) 1000 MG CAPS Take by mouth.    . furosemide (LASIX) 40 MG tablet Take 1 tablet (40 mg total) by mouth 2 (two) times daily. 30 tablet 6  . hydrALAZINE (APRESOLINE) 50 MG tablet Take 1.5 tablets (75 mg total) by mouth 3 (three) times daily. 135 tablet 6  . isosorbide mononitrate (IMDUR) 60 MG 24 hr tablet Take 1 tablet (60 mg total) by mouth 2 (two) times daily. 60 tablet 6  . Multiple Vitamin (MULTIVITAMIN WITH MINERALS) TABS tablet Take 1 tablet by mouth daily. Mega Men 50 Plus    . nitroGLYCERIN (NITROSTAT) 0.4 MG SL tablet Place 1 tablet (0.4 mg total) under the tongue every 5 (five) minutes x 3 doses as needed for chest pain. 30 tablet 12  . Oxycodone HCl 10 MG TABS Take 10 mg by mouth 3 (three) times daily.     . potassium chloride (K-DUR) 10 MEQ tablet Take 1 tablet (10 mEq total) by mouth daily. 30 tablet 6  . pregabalin (LYRICA) 75 MG capsule Take 75 mg by mouth 2 (two) times daily.    [name] spironolactone (ALDACTONE) 25 MG tablet TAKE ONE-HALF TABLET BY MOUTH ONCE DAILY 30 tablet 1  . Turmeric (CURCUMIN 95 PO) Take 1 tablet by mouth.    . Turmeric 500 MG CAPS Take 1 capsule by mouth.    . Spacer/Aero-Holding Chambers (AEROCHAMBER PLUS) inhaler Use as instructed (Patient not taking: Reported on 07/27/2016) 1 each 0   No current facility-administered medications for this encounter.     Allergies  Allergen Reactions  . Iohexol Anaphylaxis and Other (See Comments)     Desc: PT HAS ANAPHYLAXIS WITH CONTRAST MEDIA!  . Lipitor [Atorvastatin Calcium] Anaphylaxis and Other (See Comments)    Large doses  . Shellfish Allergy Anaphylaxis  . Sulfonamide Derivatives Shortness Of Breath  . Latex  Rash    With long periods of exposure  . Zocor [Simvastatin] Other (See Comments)    Muscle cramps    Vital Signs: Vitals:   07/27/16 1512  BP: 112/74  BP Location: Right Arm  Patient Position: Sitting  Cuff Size: Normal  Weight: 178 lb 12.8 oz (81.1 kg)  Height: 5\' 11"  (1.803 m)    Physical Exam: General:  Well appearing. No resp difficulty. Girlfriend present  HEENT: normal Neck: supple. JVP 5-6cm. Carotids 2+ bilat; carotid bruits L>R No lymphadenopathy or thryomegaly. Cor: PMI nondisplaced. Regular rate & rhythm. 2/6 SEM RUSB  Lungs: CTA Abdomen: soft, nontender, mildly distended. No hepatosplenomegaly. No bruits or masses. Good bowel sounds. Extremities: no cyanosis, clubbing, rash. No edema.  Neuro: alert &  orientedx3, cranial nerves grossly intact. moves all 4 extremities w/o difficulty. Affect flat  ASSESSMENT/Plan  1. Chronic systolic HF: Ischemic cardiomyopathy.  Echo (2/16) with EF 25%. S/p [employer] ICD. NYHA class II symptoms. Minimal volume overload. Continue Lasix 20 mg every other day. Will have him take an extra dose or two over the next few days - On goal carvedilol 25 mg twice a day.   Continue hydralazine 75 mg bid/ Imdur 60 bid.  - Continue spironolactone 12.5 mg daily.  - Not on ACEI due to CKD and solitary kidney. - Check BMET  - Reinforced the need and importance of daily weights, a low sodium diet, and fluid restriction (less than 2 L a day). Instructed to call the HF clinic if weight increases more than 3 lbs overnight or 5 lbs in a week.  - Echo overdue. 2. CAD: s/p CABG.  Occasional atypical chest pain, not related to activity. Dr Haroldine Laws reviewed [facility] and did note minor changes. -Cardiolite 11/2015 with evidence for reversibility and ischemia involving the anterolateral wall and a portion of the lateral wall. - Cardiolite in 2/16 with inferior scar c/w prior MI, no significant ischemia. Would have very high threshold to cath with  chronic aoritc dissection and CKD with solitary kidney. - Continue Plavix and ASA 81 mg daily.   - Statin intolerance at any dose.  - Continue Zetia. Last lipids 09/2014 (normal).  3. 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 HTN: - stable.  5. H/o Type I aortic dissection: Follows with CVTS.  6. Non-compliance  Follow up in 3 months with Dr Haroldine Laws.    Amy Clegg NP-C   Patient seen and examined with Darrick Grinder, NP. We discussed all aspects of the encounter. I agree with the assessment and plan as stated above.   Overall stable. NYHA II. Volume status mildly elevated. Can take extra lasix as needed. CAD stable with no evidence of ischemia. Continue medical therapy. HTN well controlled. See back in 6 months.   Gustabo Gordillo,MD 12:48 AM

## 2016-07-27 NOTE — Patient Instructions (Signed)
Your physician recommends that you schedule a follow-up appointment in: 3 months with Dr. Bensimhon  

## 2016-07-28 ENCOUNTER — Telehealth (HOSPITAL_COMMUNITY): Payer: Self-pay | Admitting: Cardiac Rehabilitation

## 2016-07-28 ENCOUNTER — Telehealth: Payer: Self-pay | Admitting: Internal Medicine

## 2016-07-28 NOTE — Telephone Encounter (Signed)
pc to pt to discuss enrolling in cardiac rehab. Pt is currently exercising on his own walking and swimming at neighborhood pool. Pt wife states pt is not interested at this time.  Instructed to call cardiac rehab if decides to participate in the future. Understanding verbalized.

## 2016-08-09 ENCOUNTER — Ambulatory Visit: Payer: Medicaid Other | Admitting: Cardiothoracic Surgery

## 2016-08-25 NOTE — Addendum Note (Signed)
Encounter addended by: Jolaine Artist, MD on: 08/25/2016 12:48 AM<BR>    Actions taken: Visit diagnoses modified, LOS modified, Sign clinical note

## 2016-09-04 ENCOUNTER — Other Ambulatory Visit (HOSPITAL_COMMUNITY): Payer: Self-pay | Admitting: Adult Health

## 2016-09-14 NOTE — Progress Notes (Signed)
This encounter was created in error - please disregard.

## 2016-10-02 ENCOUNTER — Other Ambulatory Visit: Payer: Self-pay | Admitting: Family Medicine

## 2016-10-04 ENCOUNTER — Emergency Department (HOSPITAL_COMMUNITY): Payer: Medicaid Other

## 2016-10-04 ENCOUNTER — Emergency Department (HOSPITAL_COMMUNITY)
Admission: EM | Admit: 2016-10-04 | Discharge: 2016-10-04 | Disposition: A | Discharge status: Home / Self Care | Payer: Medicaid Other | Attending: Emergency Medicine | Admitting: Emergency Medicine

## 2016-10-04 ENCOUNTER — Encounter (HOSPITAL_COMMUNITY): Payer: Self-pay | Admitting: *Deleted

## 2016-10-04 DIAGNOSIS — T82190A Other mechanical complication of cardiac electrode, initial encounter: Secondary | ICD-10-CM | POA: Diagnosis not present

## 2016-10-04 DIAGNOSIS — I5043 Acute on chronic combined systolic (congestive) and diastolic (congestive) heart failure: Secondary | ICD-10-CM | POA: Diagnosis not present

## 2016-10-04 DIAGNOSIS — I2510 Atherosclerotic heart disease of native coronary artery without angina pectoris: Secondary | ICD-10-CM | POA: Diagnosis not present

## 2016-10-04 DIAGNOSIS — Z91040 Latex allergy status: Secondary | ICD-10-CM | POA: Insufficient documentation

## 2016-10-04 DIAGNOSIS — Z7982 Long term (current) use of aspirin: Secondary | ICD-10-CM | POA: Diagnosis not present

## 2016-10-04 DIAGNOSIS — Z951 Presence of aortocoronary bypass graft: Secondary | ICD-10-CM | POA: Diagnosis not present

## 2016-10-04 DIAGNOSIS — I130 Hypertensive heart and chronic kidney disease with heart failure and stage 1 through stage 4 chronic kidney disease, or unspecified chronic kidney disease: Secondary | ICD-10-CM | POA: Diagnosis not present

## 2016-10-04 DIAGNOSIS — N183 Chronic kidney disease, stage 3 (moderate): Secondary | ICD-10-CM | POA: Insufficient documentation

## 2016-10-04 DIAGNOSIS — Z85850 Personal history of malignant neoplasm of thyroid: Secondary | ICD-10-CM | POA: Diagnosis not present

## 2016-10-04 DIAGNOSIS — Y712 Prosthetic and other implants, materials and accessory cardiovascular devices associated with adverse incidents: Secondary | ICD-10-CM | POA: Diagnosis not present

## 2016-10-04 DIAGNOSIS — T82118A Breakdown (mechanical) of other cardiac electronic device, initial encounter: Secondary | ICD-10-CM | POA: Diagnosis present

## 2016-10-04 DIAGNOSIS — T82110A Breakdown (mechanical) of cardiac electrode, initial encounter: Secondary | ICD-10-CM

## 2016-10-04 LAB — CBC
HEMATOCRIT: 37.1 % — AB (ref 39.0–52.0)
Hemoglobin: 12.4 g/dL — ABNORMAL LOW (ref 13.0–17.0)
MCH: 26.8 pg (ref 26.0–34.0)
MCHC: 33.4 g/dL (ref 30.0–36.0)
MCV: 80.3 fL (ref 78.0–100.0)
Platelets: 134 10*3/uL — ABNORMAL LOW (ref 150–400)
RBC: 4.62 MIL/uL (ref 4.22–5.81)
RDW: 15.3 % (ref 11.5–15.5)
WBC: 3.2 10*3/uL — AB (ref 4.0–10.5)

## 2016-10-04 LAB — BASIC METABOLIC PANEL
Anion gap: 7 (ref 5–15)
BUN: 21 mg/dL — ABNORMAL HIGH (ref 6–20)
CHLORIDE: 106 mmol/L (ref 101–111)
CO2: 24 mmol/L (ref 22–32)
CREATININE: 1.59 mg/dL — AB (ref 0.61–1.24)
Calcium: 9 mg/dL (ref 8.9–10.3)
GFR calc non Af Amer: 48 mL/min — ABNORMAL LOW (ref >60)
GFR, EST AFRICAN AMERICAN: 56 mL/min — AB (ref >60)
Glucose, Bld: 100 mg/dL — ABNORMAL HIGH (ref 65–99)
POTASSIUM: 4.3 mmol/L (ref 3.5–5.1)
SODIUM: 137 mmol/L (ref 135–145)

## 2016-10-04 LAB — MAGNESIUM: MAGNESIUM: 2.1 mg/dL (ref 1.7–2.4)

## 2016-10-04 LAB — I-STAT TROPONIN, ED: Troponin i, poc: 0.03 ng/mL (ref 0.00–0.08)
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
## 2016-10-04 NOTE — Discharge Instructions (Signed)
Follow-up with electrophysiology for management of the broken lead.
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
## 2016-10-04 NOTE — ED Notes (Signed)
Public librarian at bedside to interrogate device.
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
## 2016-10-04 NOTE — Consult Note (Signed)
CARDIOLOGY CONSULT NOTE     Primary Care Physician: [name], MD Referring Physician:  Admit Date: 10/04/2016  Reason for consultation: ICD shocks  Chad [name] is a 55 y.o. male with a h/o ischemic cardiomyopathy. He on a ladder and received an ICD shock saying it felt like getting kicked in the back.  Was not having cheat pain or dizziness prior to the shock. On presentation to the ER, had another shock when trying to interrogate the device.  On device interrogation, found to have elevated impedence on the pace/sense portion of the ICD lead, consistent with lead fracture.  Today, he denies symptoms of palpitations, chest pain, shortness of breath, orthopnea, PND, lower extremity edema, dizziness, presyncope, syncope, or neurologic sequela. The patient is tolerating medications without difficulties.  Past Medical History:  Diagnosis Date  . AICD (automatic cardioverter/defibrillator) present   . Anemia   . Aortic dissection, thoracoabdominal ([place])    7/10: Type I s/p repair  . CAD (coronary artery disease)    a. s/p CABG 2006;  b. DES to PDA 2011 (cath: Dx not seen, dRCA/PDA tx with DES; S-PDA occluded ([place]), S-Dx occluded, S-RI and OM ok, L-LAD ok  . Carotid stenosis    dopplers 2011: 0-39% bilat.  . Chest pain syndrome   . CHF (congestive heart failure) ([place])   . Chronic systolic heart failure (HCC)    a. 12/13 ECHO: EF 35-40%, sept, apical & posterobasal HK, LV mod dil & sys fx mod reduced, mild AI, MV mild reg, TV mild reg  . CRI (chronic renal insufficiency)   . Gout   . HLD (hyperlipidemia)   . HTN (hypertension)    severe  . Thyroid cancer ([place])    Hertle Cell   Past Surgical History:  Procedure Laterality Date  . CARDIAC DEFIBRILLATOR PLACEMENT     Chemical engineer  . CORONARY ARTERY BYPASS GRAFT  06/21/2009   x2  . IMPLANTABLE CARDIOVERTER DEFIBRILLATOR (ICD) GENERATOR CHANGE N/A 12/13/2012   Procedure: ICD GENERATOR CHANGE;  Surgeon: Evans Lance, MD;  Location[facility] CATH LAB;  Service: Cardiovascular;  Laterality: N/A;  . LUMBAR DISC SURGERY     x 2, most recent within 5-10 years  . MEDIAN STERNOTOMY,CABG X 7  06/29/2005  . Status post emergency repair of a type A ascending aortic dissection with a hemiarch reconstruction of the ascending aorta  using a 28-mm Hemashield graft with redo sternotomy and revision of previous bypass grafts in June 2010.    . THYROIDECTOMY, PARTIAL  06/20/11       Allergies  Allergen Reactions  . Iohexol Anaphylaxis and Other (See Comments)     Desc: PT HAS ANAPHYLAXIS WITH CONTRAST MEDIA!  . Lipitor [Atorvastatin Calcium] Anaphylaxis and Other (See Comments)    Large doses  . Shellfish Allergy Anaphylaxis  . Sulfonamide Derivatives Shortness Of Breath  . Latex Rash    With long periods of exposure  . Zocor [Simvastatin] Other (See Comments)    Muscle cramps    Social History   Social History  . Marital status: Divorced    Spouse name: N/A  . Number of children: N/A  . Years of education: N/A   Occupational History  . disabled    Social History Main Topics  . Smoking status: Never Smoker  . Smokeless tobacco: Never Used  . Alcohol use No  . Drug use: No  . Sexual activity: Yes   Other Topics Concern  . Not on file   Social  History Narrative   Engaged and lives with fiancee and 4 kids.    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     Family History  Problem Relation Age of Onset  . Hypertension Father   . Heart disease Father   . Early death Father   . COPD Father   . Hypertension Mother   . Coronary artery disease      ROS- All systems are reviewed and negative except as per the HPI above  Physical Exam: Telemetry: Vitals:   10/04/16 1500 10/04/16 1515 10/04/16 1530 10/04/16 1545  BP: 107/71 111/73 109/75 114/70  Pulse: 71 (!) 58 62 65  Resp: 16 16 16 15   Temp:      TempSrc:      SpO2: 98% 99% 97% 99%  Weight:      Height:        GEN- The  patient is well appearing, alert and oriented x 3 today.   Head- normocephalic, atraumatic Eyes-  Sclera clear, conjunctiva pink Ears- hearing intact Oropharynx- clear Neck- supple, no JVP Lymph- no cervical lymphadenopathy Lungs- Clear to ausculation bilaterally, normal work of breathing Heart- Regular rate and rhythm, no murmurs, rubs or gallops, PMI not laterally displaced GI- soft, NT, ND, + BS Extremities- no clubbing, cyanosis, or edema MS- no significant deformity or atrophy Skin- no rash or lesion Psych- euthymic mood, full affect Neuro- strength and sensation are intact  EKG:  Labs:   Lab Results  Component Value Date   WBC 3.2 (L) 10/04/2016   HGB 12.4 (L) 10/04/2016   HCT 37.1 (L) 10/04/2016   MCV 80.3 10/04/2016   PLT 134 (L) 10/04/2016    Recent Labs Lab 10/04/16 1126  NA 137  K 4.3  CL 106  CO2 24  BUN 21*  CREATININE 1.59*  CALCIUM 9.0  GLUCOSE 100*   Lab Results  Component Value Date   CKTOTAL 134 02/29/2012   CKMB 2.0 02/29/2012   TROPONINI <0.01 05/01/2016    Lab Results  Component Value Date   CHOL 182 09/28/2015   CHOL 159 10/11/2014   CHOL 186 11/25/2013   Lab Results  Component Value Date   HDL 54 09/28/2015   HDL 57 10/11/2014   HDL 49.80 11/25/2013   Lab Results  Component Value Date   LDLCALC 107 (H) 09/28/2015   LDLCALC 92 10/11/2014   LDLCALC 112 (H) 11/25/2013   Lab Results  Component Value Date   TRIG 104 09/28/2015   TRIG 52 10/11/2014   TRIG 121.0 11/25/2013   Lab Results  Component Value Date   CHOLHDL 3.4 09/28/2015   CHOLHDL 2.8 10/11/2014   CHOLHDL 4 11/25/2013   Lab Results  Component Value Date   LDLDIRECT 120.1 01/14/2008   LDLDIRECT 147.3 03/05/2007      Radiology: 1. Stable moderate enlargement of the cardiopericardial silhouette, without edema. 2. Stable dilated aortic arch. The patient has a known graft of the ascending aorta. 3. No radiographic abnormality of the defibrillator is  identified, its distal tip projects over the expected location of the right ventricle.  Echo: - Left ventricle: The cavity size was severely dilated. Wall   thickness was increased in a pattern of mild LVH. Systolic   function was severely reduced. The estimated ejection fraction   was in the range of 20% to 25%. Diffuse hypokinesis. There is   akinesis of the inferolateral and inferior myocardium. Doppler   parameters are consistent with abnormal left ventricular   relaxation (  grade 1 diastolic dysfunction). - Aortic valve: There was mild regurgitation. - Aortic root: The aortic root was moderately dilated. - Mitral valve: There was mild regurgitation. - Left atrium: The atrium was moderately dilated. - Right ventricle: Systolic function was mildly to moderately   reduced.  ASSESSMENT AND PLAN:   1.  ICD lead fracture: Interrogation shows elevated impedence.  Did have ICD shock with interrogation showing artifact on the lead.  With arm movements had reproduction of artifact on pace/sense portion of the lead.  Due to his lead fracture, have turned off his ICD.  His device was placed for primary prevention without appropriate shocks.  Sadi Arave plan for him to follow up in EP clinic with Dr. Lovena Le for discussion of lead revision or extraction and replacement.  2. Ischemic cardiomyopathy:  Appears to be well compensated without evidence of volume overload. Plan for regular clinic follow up.   Haliyah Fryman Meredith Leeds, MD 10/04/2016  9:43 PM

## 2016-10-04 NOTE — ED Notes (Signed)
Cardiology at bedside.

## 2016-10-10 ENCOUNTER — Encounter: Payer: Self-pay | Admitting: Internal Medicine

## 2016-10-11 ENCOUNTER — Encounter: Payer: Self-pay | Admitting: Internal Medicine

## 2016-10-11 ENCOUNTER — Ambulatory Visit (INDEPENDENT_AMBULATORY_CARE_PROVIDER_SITE_OTHER): Payer: Medicaid Other | Admitting: Internal Medicine

## 2016-10-11 VITALS — BP 120/72 | HR 76 | Ht 71.0 in | Wt 185.0 lb

## 2016-10-11 DIAGNOSIS — Z01812 Encounter for preprocedural laboratory examination: Secondary | ICD-10-CM

## 2016-10-11 NOTE — Patient Instructions (Signed)
Medication Instructions:  Your physician recommends that you continue on your current medications as directed. Please refer to the Current Medication list given to you today.   Labwork: BMET/CBC on 10/17/16 at [facility]  Testing/Procedures: ICD lead removal and new lead insertion (possible generator change)  Follow-Up: Follow-up for a wound check in the Device Clinic 10-14 days from 10/24/16 and with Dr. Lovena Le 91 days after procedure.    Any Other Special Instructions Will Be Listed Below ---  Please wash with the CHG Soap the night before and morning of procedure (follow instruction page "Preparing For Surgery").   Report to the [facility] at 10/24/16 at 5:30 AM  Nothing to eat or drink after midnight the night before procedure  Do not take any medication morning of procedure  Plan 1 night stay    If you need a refill on your cardiac medications before your next appointment, please call your pharmacy.

## 2016-10-11 NOTE — Progress Notes (Signed)
HPI Chad [name] returns today for followup. He is a 55 year old man with an ischemic cardiomyopathy, status post bypass surgery, with chronic systolic heart failure, ejection fraction 25%. The patient is status post ICD implantation.  He had several ICD shocks last week and was found to have a fractured rate/sense portion of his lead. His device has been turned off. He has never had an ICD shock prior to this. He has an EF of 20% by echo less than a year ago. He has had longstanding LV dysfunction since his remote MI. His heart failure symptoms are class 2A.  Allergies  Allergen Reactions  . Iohexol Anaphylaxis and Other (See Comments)     Desc: PT HAS ANAPHYLAXIS WITH CONTRAST MEDIA!  . Lipitor [Atorvastatin Calcium] Anaphylaxis and Other (See Comments)    Large doses  . Shellfish Allergy Anaphylaxis  . Sulfonamide Derivatives Shortness Of Breath  . Sulfa Antibiotics Swelling  . Latex Rash    With long periods of exposure  . Zocor [Simvastatin] Other (See Comments)    Muscle cramps     Current Outpatient Prescriptions  Medication Sig Dispense Refill  . albuterol (PROVENTIL HFA;VENTOLIN HFA) 108 (90 Base) MCG/ACT inhaler Inhale 2 puffs into the lungs every 6 (six) hours as needed for wheezing or shortness of breath. 1 Inhaler 0  . amLODipine (NORVASC) 5 MG tablet Take 5 mg by mouth as directed. Pt takes up to 2 tablets daily    . aspirin EC 81 MG tablet Take 1 tablet (81 mg total) by mouth daily. 90 tablet 3  . carvedilol (COREG) 25 MG tablet Take 1 tablet (25 mg total) by mouth 2 (two) times daily with a meal. 60 tablet 6  . clopidogrel (PLAVIX) 75 MG tablet TAKE ONE TABLET BY MOUTH ONCE DAILY 30 tablet 3  . cyclobenzaprine (FLEXERIL) 10 MG tablet Take 10 mg by mouth daily as needed for muscle spasms.     . diphenhydrAMINE (BENADRYL) 25 MG tablet Take 12.5 mg by mouth 2 (two) times daily as needed for itching or allergies. Reported on 01/13/2016    . Flaxseed, Linseed, (FLAXSEED OIL PO)  Take 2 capsules by mouth 2 (two) times daily.    . furosemide (LASIX) 40 MG tablet Take 40 mg by mouth 2 (two) times daily as needed for edema.    . hydrALAZINE (APRESOLINE) 50 MG tablet TAKE ONE & ONE-HALF TABLETS BY MOUTH THREE TIMES DAILY 135 tablet 6  . ibuprofen (ADVIL,MOTRIN) 200 MG tablet Take 600 mg by mouth every 6 (six) hours as needed (pain). Pt rarely takes    . isosorbide mononitrate (IMDUR) 60 MG 24 hr tablet Take 1 tablet (60 mg total) by mouth 2 (two) times daily. 60 tablet 6  . Multiple Vitamin (MULTIVITAMIN WITH MINERALS) TABS tablet Take 1 tablet by mouth daily. Mega Men 50 Plus    . nitroGLYCERIN (NITROSTAT) 0.4 MG SL tablet Place 1 tablet (0.4 mg total) under the tongue every 5 (five) minutes x 3 doses as needed for chest pain. 30 tablet 12  . Oxycodone HCl 10 MG TABS Take 10 mg by mouth 3 (three) times daily.     . potassium chloride (K-DUR) 10 MEQ tablet Take 1 tablet (10 mEq total) by mouth daily. 30 tablet 6  . Spacer/Aero-Holding Chambers (AEROCHAMBER PLUS) inhaler Use as instructed 1 each 0  . spironolactone (ALDACTONE) 25 MG tablet TAKE ONE-HALF TABLET BY MOUTH ONCE DAILY 30 tablet 1  . Turmeric (CURCUMIN 95 PO) Take 2 tablets by mouth  2 (two) times daily.      No current facility-administered medications for this visit.      Past Medical History:  Diagnosis Date  . AICD (automatic cardioverter/defibrillator) present   . Anemia   . Aortic dissection, thoracoabdominal ([place])    7/10: Type I s/p repair  . CAD (coronary artery disease)    a. s/p CABG 2006;  b. DES to PDA 2011 (cath: Dx not seen, dRCA/PDA tx with DES; S-PDA occluded (culprit), S-Dx occluded, S-RI and OM ok, L-LAD ok  . Carotid stenosis    dopplers 2011: 0-39% bilat.  . Chest pain syndrome   . CHF (congestive heart failure) ([place])   . Chronic systolic heart failure (H[facility])    a. 12/13 ECHO: EF 35-40%, sept, apical & posterobasal HK, LV mod dil & sys fx mod reduced, mild AI, MV mild reg, TV mild reg   . CRI (chronic renal insufficiency)   . Gout   . HLD (hyperlipidemia)   . HTN (hypertension)    severe  . Thyroid cancer ([place])    [employer]    ROS:   All systems reviewed and negative except as noted in the HPI.   Past Surgical History:  Procedure Laterality Date  . CARDIAC DEFIBRILLATOR PLACEMENT     Chemical engineer  . CORONARY ARTERY BYPASS GRAFT  06/21/2009   x2  . IMPLANTABLE CARDIOVERTER DEFIBRILLATOR (ICD) GENERATOR CHANGE N/A 12/13/2012   Procedure: ICD GENERATOR CHANGE;  Surgeon: Evans Lance, MD;  [facility] LAB;  Service: Cardiovascular;  Laterality: N/A;  . LUMBAR DISC SURGERY     x 2, most recent within 5-10 years  . MEDIAN STERNOTOMY,CABG X 7  06/29/2005  . Status post emergency repair of a type A ascending aortic dissection with a hemiarch reconstruction of the ascending aorta  using a 28-mm Hemashield graft with redo sternotomy and revision of previous bypass grafts in June 2010.    . THYROIDECTOMY, PARTIAL  06/20/11     Family History  Problem Relation Age of Onset  . Hypertension Father   . Heart disease Father   . Early death Father   . COPD Father   . Hypertension Mother   . Coronary artery disease       Social History   Social History  . Marital status: Divorced    Spouse name: N/A  . Number of children: N/A  . Years of education: N/A   Occupational History  . disabled    Social History Main Topics  . Smoking status: Never Smoker  . Smokeless tobacco: Never Used  . Alcohol use No  . Drug use: No  . Sexual activity: Yes   Other Topics Concern  . Not on file   Social History Narrative   Engaged and lives with fiancee and 4 kids.    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      BP 120/72   Pulse 76   Ht 5\' 11"  (1.803 m)   Wt 185 lb (83.9 kg)   BMI 25.80 kg/m   Physical Exam:  Chronically ill appearing middle-aged man, NAD HEENT: Unremarkable Neck:  7 cm JVD, no thyromegally Lungs:  Clear except for  rales in the bases bilaterally. No wheezes or rhonchi. No increased work of breathing. HEART:  Regular rate rhythm, an intermittant MR murmur appears present with premature beats, no rubs, no clicks, Abd:  soft, positive bowel sounds, no organomegally, no rebound, no guarding Ext:  2 plus pulses, no edema,  no cyanosis, no clubbing Skin:  No rashes no nodules Neuro:  CN II through XII intact, motor grossly intact  DEVICE  Normal device function.  See PaceArt for details.  Assess/Plan:  1. Broken ICD lead - his device remains off. Will schedule ICD lead extraction. Will place a new ICD if his longevity is less than 5 years. I have discussed the risks/benefits/goals/expectations of ICD lead removal via transvenous extraction and insertion of a new ICD lead and he wishes to proceed. 2. ICM - he has no anginal symptoms. Will follow. 3. Chronic systolic heart failure - his symptoms are class 2. He will continue his current meds. 4. HTN - his blood pressure is well controlled on medical therapy.  Chad [name].D.

## 2016-10-13 ENCOUNTER — Telehealth: Payer: Self-pay

## 2016-10-13 NOTE — Telephone Encounter (Signed)
Called, left voice message. Informed his appt on 10/24/16 for lead extraction/replacememt will have to be cancelled and rescheduled. There was a conflict at the hospital with the date they scheduled. Requested return call asap.

## 2016-10-14 ENCOUNTER — Telehealth: Payer: Self-pay | Admitting: Internal Medicine

## 2016-10-14 NOTE — Telephone Encounter (Signed)
Received call as cardiology fellow on call overnight that patient weight up 3 lbs (dry weight ~180 per him, now 183 with some inc shortness of breath).  He says he is anxious.  Took his last 40 mg lasix this evening, has 1/2 tab left (takes 40 mg PO BID).  I encouraged him to take this 20 mg tablet now and call his [place] to get his refill tonight and to call back if concerning symptoms.  Probably needs double dose for a couple days and will need refill anyway.  He knows that should his symptoms worsen he may need to present for an IV dose to avoid admission.  He knows to call back if symptoms change/worsen -- will forward to/FYI Dr. Fransico Him Means MD 6:05 PM

## 2016-10-16 NOTE — Telephone Encounter (Signed)
Called, left msg for pt to call our office asap. Informed we need to cancel appt for ICD lead removal on 10/24/16 and move it to 11/07/16.

## 2016-10-18 NOTE — Telephone Encounter (Signed)
Call, spoke with pt. Informed lead removal surgery was rescheduled to 11/07/16 arriving at hospital at 11:30 AM. Pt was okay with reschedule. Pt will come in on 10/24/16 for lab work. Informed I will ask recommendation from Dr. Lovena Le regarding HTN meds prior to procedure. Informed I will be back in touch with pt. Pt verbalized understanding and agreed with plan.

## 2016-10-30 NOTE — Telephone Encounter (Signed)
Spoke to [employer], Therapist, sports at pre-admission testing after call for other patient. Requesting order entry by Dr. Lovena Le. Patient will be seen in pre-admission testing tomorrow morning. He is set up for ICD lead removal, but there are no current orders for this in system. Routed for review.

## 2016-10-31 ENCOUNTER — Encounter (HOSPITAL_COMMUNITY)
Admission: RE | Admit: 2016-10-31 | Discharge: 2016-10-31 | Disposition: A | Discharge status: Home / Self Care | Payer: Medicaid Other | Source: Ambulatory Visit | Attending: Internal Medicine | Admitting: Internal Medicine

## 2016-10-31 ENCOUNTER — Encounter (HOSPITAL_COMMUNITY): Payer: Self-pay

## 2016-10-31 DIAGNOSIS — Z01812 Encounter for preprocedural laboratory examination: Secondary | ICD-10-CM | POA: Diagnosis present

## 2016-10-31 HISTORY — DX: Family history of other specified conditions: Z84.89

## 2016-10-31 HISTORY — DX: Other complications of anesthesia, initial encounter: T88.59XA

## 2016-10-31 HISTORY — DX: Adverse effect of unspecified anesthetic, initial encounter: T41.45XA

## 2016-10-31 HISTORY — DX: Dyspnea, unspecified: R06.00

## 2016-10-31 LAB — BASIC METABOLIC PANEL
ANION GAP: 8 (ref 5–15)
BUN: 21 mg/dL — ABNORMAL HIGH (ref 6–20)
CALCIUM: 9.2 mg/dL (ref 8.9–10.3)
CHLORIDE: 107 mmol/L (ref 101–111)
CO2: 24 mmol/L (ref 22–32)
Creatinine, Ser: 1.43 mg/dL — ABNORMAL HIGH (ref 0.61–1.24)
GFR calc non Af Amer: 55 mL/min — ABNORMAL LOW (ref >60)
Glucose, Bld: 90 mg/dL (ref 65–99)
Potassium: 4.6 mmol/L (ref 3.5–5.1)
Sodium: 139 mmol/L (ref 135–145)

## 2016-10-31 LAB — CBC
HEMATOCRIT: 37.9 % — AB (ref 39.0–52.0)
HEMOGLOBIN: 12.7 g/dL — AB (ref 13.0–17.0)
MCH: 27 pg (ref 26.0–34.0)
MCHC: 33.5 g/dL (ref 30.0–36.0)
MCV: 80.5 fL (ref 78.0–100.0)
Platelets: 130 10*3/uL — ABNORMAL LOW (ref 150–400)
RBC: 4.71 MIL/uL (ref 4.22–5.81)
RDW: 14.6 % (ref 11.5–15.5)
WBC: 3.4 10*3/uL — AB (ref 4.0–10.5)

## 2016-10-31 LAB — SURGICAL PCR SCREEN
MRSA, PCR: NEGATIVE
STAPHYLOCOCCUS AUREUS: POSITIVE — AB

## 2016-10-31 NOTE — Telephone Encounter (Signed)
Called, left message for pt r/t to upcoming procedure on 11/07/16. Pt needs to come in for lab work this week. Pt may take meds early the morning of procedure except HOLD lasix.

## 2016-10-31 NOTE — Progress Notes (Signed)
I called a prescription for Mupirocin ointment to [facility] , [address], , [place] 

## 2016-10-31 NOTE — Progress Notes (Signed)
PCP - Dr. [name] Cardiologist - Dr. Junie Bame  EKG - 10/05/16 CXR - 10/04/16  Echo- 10/21/16 Stress test - 12/06/15 Cardiac Cath - denies  Patient denies chest pain but states that he has been having shortness of breath with exertion since his ICD was turned off.     Joey with [employer] notified of surgery date and time.

## 2016-10-31 NOTE — Pre-Procedure Instructions (Signed)
Chad [name]  10/31/2016      [place], [place] - [address] Chad [name] [place] [address] Phone: [phone] Fax: [phone]  [facility] [place], [place] [address] [place] [address] Phone: [phone] Fax: [place], [place] [address] Phone: [phone] Fax: [phone]  [place] [address] [place] - [address] [facility] [place] [address] Phone: [phone] Fax: [phone]  [place], [place] - [place] [address] [place] [place] [address] Phone: [phone] Fax: [phone]    Your procedure is scheduled on Tuesday, November 14th, 2017.  Report to [facility] Admitting at 11:30 A.M.   Call this number if you have problems the morning of surgery:  [phone]   Remember:  Do not eat food or drink liquids after midnight.   Take these medicines the morning of surgery with A SIP OF WATER: Albuterol Inhaler if needed (please bring with you), Amlodipine (Norvasc), Carvedilol (Coreg), Cyclobenzaprine (Flexeril) if needed, Hydralazine (Apresoline), Isosorbide Mononitrate (Imdur), Eye drops, Oxycodone HCL.    Please follow MD's instructions for Plavix and Aspirin.  7 days prior to surgery, stop taking: Ibuprofen, Advil, Motrin, BC's, Goody's, Aleve, Naproxen, Fish oil, all herbal medications, and all vitamins.    Do not wear jewelry.  Do not wear lotions, powders, or colognes, or deoderant.  Men may shave face and neck.  Do not bring valuables to the hospital.  [facility] is not responsible for any belongings or valuables.  Contacts, dentures or bridgework may not be worn into surgery.  Leave your suitcase in the car.  After surgery it may be brought to your room.  For  patients admitted to the hospital, discharge time will be determined by your treatment team.  Patients discharged the day of surgery will not be allowed to drive home.   Special instructions:  Preparing for Surgery.      [facility]- Preparing For Surgery  Before surgery, you can play an important role. Because skin is not sterile, your skin needs to be as free of germs as possible. You can reduce the number of germs on your skin by washing with CHG (chlorahexidine gluconate) Soap before surgery.  CHG is an antiseptic cleaner which kills germs and bonds with the skin to continue killing germs even after washing.  Please do not use if you have an allergy to CHG or antibacterial soaps. If your skin becomes reddened/irritated stop using the CHG.  Do not shave (including legs and underarms) for at least 48 hours prior to first CHG shower. It is OK to shave your face.  Please follow these instructions carefully.   1. Shower the NIGHT BEFORE SURGERY and the MORNING OF SURGERY with CHG.   2. If you chose to wash your hair, wash your hair first as usual with your normal shampoo.  3. After you shampoo, rinse your hair and body thoroughly to remove the shampoo.  4. Use CHG as you would any other liquid soap. You can apply CHG directly to the skin and wash gently with a scrungie or a clean washcloth.   5. Apply the CHG Soap to your body ONLY FROM THE NECK DOWN.  Do not use on open wounds or open sores. Avoid contact with your eyes, ears, mouth and genitals (  private parts). Wash genitals (private parts) with your normal soap.  6. Wash thoroughly, paying special attention to the area where your surgery will be performed.  7. Thoroughly rinse your body with warm water from the neck down.  8. DO NOT shower/wash with your normal soap after using and rinsing off the CHG Soap.  9. Pat yourself dry with a CLEAN TOWEL.   10. Wear CLEAN PAJAMAS   11. Place CLEAN SHEETS on your bed the night of your  first shower and DO NOT SLEEP WITH PETS.  Day of Surgery: Do not apply any deodorants/lotions. Please wear clean clothes to the hospital/surgery center.     Please read over the following fact sheets that you were given. MRSA Information

## 2016-10-31 NOTE — Telephone Encounter (Signed)
Chad [name] Comment pre admissions needs order for procedure tomorrow put in, and needs to know if pt needs to hold Plavix-pls call [phone]

## 2016-11-01 ENCOUNTER — Other Ambulatory Visit: Payer: Self-pay | Admitting: Family Medicine

## 2016-11-01 NOTE — Telephone Encounter (Signed)
Discussed with Dr Lovena Le will need to have his last dose of Plavix be 11/04/16.  Called and spoke with patient and he is awre to HOLD his Plavix after 11/04/16 prior to his extraction

## 2016-11-01 NOTE — Progress Notes (Signed)
Anesthesia Chart Review: Patient is a 55 year old male scheduled for ICD lead removal and replacement on 11/07/16 by Dr. Lovena Le. Dr. Prescott Gum is CT surgery back-up. Patient has known ischemic cardiomyopathy with AICD initially placed in 2006. On 10/04/16 he presented to the ED with multiple ICD shocks and was found to have a fracture rate/sense portion of his lead. His device was turned off and was scheduled ICD lead extraction. Dr. Lovena Le will also replace ICD if longevity is < 5 years. for the above procedure.   History includes non-smoker, HTN, MI (in [place]) 05/2005, CAD s/p CABG (LIMA-LAD, SVG-DIAG, SVG-OM1-OM2, SVG-acute maginal-PDA1-PDA2; Dr. Cyndia Bent) 06/29/05, MI 08/12/10 s/p DES to distal native RCA (had subacute SVG-PDA occlusion SVG-DIAG) 08/15/10 (patient had initially refused cath 0000000), chronic systolic CHF, single chamber AICD 11/24/05 with replacement single chamber AICD [employer]) 12/13/12, type 1 ascending aortic dissection s/p repair (28 Hemashield graft 06/21/09, Dr. Prescott Gum), CKD, thyroid cancer s/p left thyroidectomy, anemia, L4-5 microdiscectomy '02 and '04, L5-S1 microdiscectomy 07/23/09.  PCP is Dr. [name]. Cardiologist (Advancd HF) is Dr. Haroldine Laws.  Meds include albuterol, amlodipine, aspirin 81 mg, Coreg, Plavix, Flexeril, flaxseed oil, Lasix, hydralazine, Imdur, naphazoline ophthalmic, nitroglycerin, oxycodone, potassium chloride, Aldactone, turmeric. Dr. Lovena Le and Dr. Lucianne Lei Trigt's offices have been contacted to instruct patient on perioperative ASA/Plavix instructions.   BP 124/83   Pulse 79   Temp 36.7 C   Resp 18   Ht 5\' 11"  (1.803 m)   Wt 183 lb 4.8 oz (83.1 kg)   SpO2 98%   BMI 25.57 kg/m   10/04/16 EKG: SR with occasional PVCs, biatrial enlargement, incomplete left BBB, non-specific T wave abnormality.  12/06/15 Nuclear stress test: IMPRESSION: 1. Evidence for reversibility and ischemia involving the anterolateral wall and a portion of the  lateral wall. 2. Large infarct involving the inferior wall. There is dyskinesia in the inferior wall. 3. Left ventricular ejection fraction is 32%. Left ventricle dilatation. 4. High-risk stress test findings*. *2012 Appropriate Use Criteria for Coronary Revascularization Focused Update: J Am Coll Cardiol. B5713794. http://content.airportbarriers.com.aspx?articleid=1201161 (Per 12/06/15 note by Dr. Haroldine Laws, "I have reviewed Myoview images personally. EF 32%. Large fixed defect (preveious MI) with small reversible defect in the distal anterolateral/lateral walls. This is unchanged from previous. He does not have any exertional angina. CP occurred after he ran out of meds. Given that he is high risk for re-cath would treat medically.")  10/22/15 Echo: Impressions: - Global hypokinesis with akinesis of the inferior and inferolateral walls; overall severely reduced LV function, EF 20-25%; severe LVE; mild LVH; grade 1 diastolic dysfunction; moderate LAE; mild to moderate reduction in RV function; mild AI, MR and TR; moderately dilated aortic root (48 mm); suggest CTA to further assess.  08/15/10 LHC/PCI: Impression: 1. Severe 3V CAD with 3/5 patent grafts. (LIMA-LAD, SVG-[place] INT-OM patent, SVG-PDA occluded, SVG-DIAG occluded) 2. Subacute occlusion of the vein graft to the PDA, which is likely the culprit lesion. 3. PTCA/DES to native distal RCA.   02/04/13 Carotid U/S: Summary: - Technically difficult due to tortuosity and high bifurcation - No significant extracranial carotid artery stenosis demonstrated. Vertebrals are patent with antegrade flow.  10/04/16 CXR: IMPRESSION: 1. Stable moderate enlargement of the cardiopericardial silhouette, without edema. 2. Stable dilated aortic arch. The patient has a known graft of the ascending aorta. 3. No radiographic abnormality of the defibrillator is identified, its distal tip projects over the expected location of the  right Ventricle.  12/05/15 CTA Chest/Abd/pelvis: MPRESSION: 1. Post ascending aortic  graft which is patent and unchanged in configuration from prior exam. Configuration of aortic dissection distal to the aortic graft extending from the arch through the common iliac arteries is unchanged from prior. The size of the lower abdominal aorta has progressed from prior exam, currently 3.2 cm, previously 2.8 cm. No surrounding soft tissue stranding. 2. Thick-walled urinary bladder, recommend correlation with urinalysis to exclude urinary tract infection. This appears greater than expected allowing for degree of distension. 3. Chronic findings as described (see full report).  Preoperative labs noted. Cr 1.43, stable since at least 11/2015. WBC 3.4, H/H 12.7/37.9. PLT 130K. Glucose 90. T&S done.  Further evaluation by Dr. Lovena Le and anesthesiologist on the day of surgery to ensure no acute changes.  George [facility] Short Stay Center/Anesthesiology Phone [phone] 11/01/2016 3:31 PM

## 2016-11-02 ENCOUNTER — Other Ambulatory Visit: Payer: Self-pay | Admitting: Nurse Practitioner

## 2016-11-06 ENCOUNTER — Ambulatory Visit: Payer: Medicaid Other

## 2016-11-06 MED ORDER — CEFAZOLIN SODIUM-DEXTROSE 2-4 GM/100ML-% IV SOLN
2.0000 g | INTRAVENOUS | Status: AC
  Administered 2016-11-07: 2 g via INTRAVENOUS
  Filled 2016-11-06: qty 100

## 2016-11-06 NOTE — Progress Notes (Signed)
Pt. Called to say his mupirocin is not at the pharmacy ready for pickup or ordered.  Call to [facility] on [address], ordered medicine for pt., by speaking directly to pharmacist.

## 2016-11-07 ENCOUNTER — Encounter (HOSPITAL_COMMUNITY): Payer: Self-pay | Admitting: Cardiology

## 2016-11-07 ENCOUNTER — Ambulatory Visit (HOSPITAL_COMMUNITY)
Admission: RE | Admit: 2016-11-07 | Discharge: 2016-11-08 | Disposition: A | Discharge status: Home / Self Care | Payer: Medicaid Other | Source: Ambulatory Visit | Attending: Internal Medicine | Admitting: Internal Medicine

## 2016-11-07 ENCOUNTER — Ambulatory Visit (HOSPITAL_COMMUNITY): Payer: Medicaid Other

## 2016-11-07 ENCOUNTER — Ambulatory Visit (HOSPITAL_COMMUNITY): Payer: Medicaid Other | Admitting: Vascular Surgery

## 2016-11-07 ENCOUNTER — Encounter (HOSPITAL_COMMUNITY)
Admission: RE | Disposition: A | Discharge status: Home / Self Care | Payer: Self-pay | Source: Ambulatory Visit | Attending: Internal Medicine

## 2016-11-07 ENCOUNTER — Ambulatory Visit (HOSPITAL_COMMUNITY): Payer: Medicaid Other | Admitting: Anesthesiology

## 2016-11-07 DIAGNOSIS — Z7902 Long term (current) use of antithrombotics/antiplatelets: Secondary | ICD-10-CM | POA: Diagnosis not present

## 2016-11-07 DIAGNOSIS — Y831 Surgical operation with implant of artificial internal device as the cause of abnormal reaction of the patient, or of later complication, without mention of misadventure at the time of the procedure: Secondary | ICD-10-CM | POA: Diagnosis not present

## 2016-11-07 DIAGNOSIS — I252 Old myocardial infarction: Secondary | ICD-10-CM | POA: Insufficient documentation

## 2016-11-07 DIAGNOSIS — I130 Hypertensive heart and chronic kidney disease with heart failure and stage 1 through stage 4 chronic kidney disease, or unspecified chronic kidney disease: Secondary | ICD-10-CM | POA: Insufficient documentation

## 2016-11-07 DIAGNOSIS — I5022 Chronic systolic (congestive) heart failure: Secondary | ICD-10-CM | POA: Insufficient documentation

## 2016-11-07 DIAGNOSIS — Z955 Presence of coronary angioplasty implant and graft: Secondary | ICD-10-CM | POA: Diagnosis not present

## 2016-11-07 DIAGNOSIS — N189 Chronic kidney disease, unspecified: Secondary | ICD-10-CM | POA: Diagnosis not present

## 2016-11-07 DIAGNOSIS — Z23 Encounter for immunization: Secondary | ICD-10-CM | POA: Insufficient documentation

## 2016-11-07 DIAGNOSIS — Z79899 Other long term (current) drug therapy: Secondary | ICD-10-CM | POA: Insufficient documentation

## 2016-11-07 DIAGNOSIS — Z7982 Long term (current) use of aspirin: Secondary | ICD-10-CM | POA: Diagnosis not present

## 2016-11-07 DIAGNOSIS — J449 Chronic obstructive pulmonary disease, unspecified: Secondary | ICD-10-CM | POA: Insufficient documentation

## 2016-11-07 DIAGNOSIS — I2510 Atherosclerotic heart disease of native coronary artery without angina pectoris: Secondary | ICD-10-CM | POA: Insufficient documentation

## 2016-11-07 DIAGNOSIS — Z85850 Personal history of malignant neoplasm of thyroid: Secondary | ICD-10-CM | POA: Diagnosis not present

## 2016-11-07 DIAGNOSIS — Z95810 Presence of automatic (implantable) cardiac defibrillator: Secondary | ICD-10-CM | POA: Diagnosis not present

## 2016-11-07 DIAGNOSIS — I255 Ischemic cardiomyopathy: Secondary | ICD-10-CM | POA: Diagnosis not present

## 2016-11-07 DIAGNOSIS — Z951 Presence of aortocoronary bypass graft: Secondary | ICD-10-CM | POA: Diagnosis not present

## 2016-11-07 DIAGNOSIS — T82110A Breakdown (mechanical) of cardiac electrode, initial encounter: Secondary | ICD-10-CM | POA: Diagnosis not present

## 2016-11-07 HISTORY — DX: Other specified postprocedural states: R11.2

## 2016-11-07 HISTORY — DX: Unspecified chronic bronchitis: J42

## 2016-11-07 HISTORY — DX: Acute myocardial infarction, unspecified: I21.9

## 2016-11-07 HISTORY — DX: Cardiac murmur, unspecified: R01.1

## 2016-11-07 HISTORY — DX: Other chronic pain: G89.29

## 2016-11-07 HISTORY — DX: Low back pain: M54.5

## 2016-11-07 HISTORY — DX: Sleep apnea, unspecified: G47.30

## 2016-11-07 HISTORY — DX: Other specified postprocedural states: Z98.890

## 2016-11-07 HISTORY — DX: Angina pectoris, unspecified: I20.9

## 2016-11-07 HISTORY — DX: Low back pain, unspecified: M54.50

## 2016-11-07 HISTORY — DX: Unspecified asthma, uncomplicated: J45.909

## 2016-11-07 HISTORY — DX: Gastro-esophageal reflux disease without esophagitis: K21.9

## 2016-11-07 HISTORY — PX: ICD LEAD REMOVAL: SHX5855

## 2016-11-07 LAB — PREPARE RBC (CROSSMATCH)

## 2016-11-07 SURGERY — REMOVAL, ELECTRODE LEAD, ICD
Anesthesia: General | Site: Chest

## 2016-11-07 MED ORDER — CYCLOBENZAPRINE HCL 10 MG PO TABS
10.0000 mg | ORAL_TABLET | Freq: Every day | ORAL | Status: DC | PRN
  Administered 2016-11-08: 10 mg via ORAL
  Filled 2016-11-07: qty 1

## 2016-11-07 MED ORDER — AMLODIPINE BESYLATE 5 MG PO TABS
5.0000 mg | ORAL_TABLET | Freq: Every day | ORAL | Status: DC
  Administered 2016-11-07 – 2016-11-08 (×2): 5 mg via ORAL
  Filled 2016-11-07 (×2): qty 1

## 2016-11-07 MED ORDER — HEPARIN SODIUM (PORCINE) 5000 UNIT/ML IJ SOLN
INTRAMUSCULAR | Status: DC | PRN
  Administered 2016-11-07: 500 mL

## 2016-11-07 MED ORDER — FUROSEMIDE 40 MG PO TABS
40.0000 mg | ORAL_TABLET | Freq: Two times a day (BID) | ORAL | Status: DC | PRN
  Administered 2016-11-07: 40 mg via ORAL
  Filled 2016-11-07: qty 1

## 2016-11-07 MED ORDER — NITROGLYCERIN 0.4 MG SL SUBL: 0.4000 mg | SUBLINGUAL_TABLET | SUBLINGUAL | Status: DC | PRN

## 2016-11-07 MED ORDER — SUGAMMADEX SODIUM 200 MG/2ML IV SOLN
INTRAVENOUS | Status: DC | PRN
  Administered 2016-11-07: 160 mg via INTRAVENOUS

## 2016-11-07 MED ORDER — ONDANSETRON HCL 4 MG/2ML IJ SOLN
INTRAMUSCULAR | Status: DC | PRN
  Administered 2016-11-07: 4 mg via INTRAVENOUS

## 2016-11-07 MED ORDER — LIDOCAINE 2% (20 MG/ML) 5 ML SYRINGE
INTRAMUSCULAR | Status: AC
  Filled 2016-11-07: qty 5

## 2016-11-07 MED ORDER — ROCURONIUM BROMIDE 10 MG/ML (PF) SYRINGE
PREFILLED_SYRINGE | INTRAVENOUS | Status: AC
  Filled 2016-11-07: qty 10

## 2016-11-07 MED ORDER — SODIUM CHLORIDE 0.9 % IR SOLN: 80.0000 mg | Status: DC

## 2016-11-07 MED ORDER — ROCURONIUM BROMIDE 100 MG/10ML IV SOLN
INTRAVENOUS | Status: DC | PRN
  Administered 2016-11-07 (×2): 10 mg via INTRAVENOUS
  Administered 2016-11-07: 50 mg via INTRAVENOUS

## 2016-11-07 MED ORDER — FENTANYL CITRATE (PF) 250 MCG/5ML IJ SOLN
INTRAMUSCULAR | Status: AC
  Filled 2016-11-07: qty 5

## 2016-11-07 MED ORDER — MIDAZOLAM HCL 2 MG/2ML IJ SOLN
INTRAMUSCULAR | Status: DC | PRN
  Administered 2016-11-07: 1 mg via INTRAVENOUS

## 2016-11-07 MED ORDER — LIDOCAINE HCL (PF) 1 % IJ SOLN
INTRAMUSCULAR | Status: DC | PRN
  Administered 2016-11-07: 30 mL

## 2016-11-07 MED ORDER — HYDRALAZINE HCL 25 MG PO TABS
25.0000 mg | ORAL_TABLET | Freq: Three times a day (TID) | ORAL | Status: DC
  Administered 2016-11-07 – 2016-11-08 (×2): 25 mg via ORAL
  Filled 2016-11-07 (×2): qty 1

## 2016-11-07 MED ORDER — SUGAMMADEX SODIUM 200 MG/2ML IV SOLN
INTRAVENOUS | Status: AC
  Filled 2016-11-07: qty 2

## 2016-11-07 MED ORDER — ALBUTEROL SULFATE (2.5 MG/3ML) 0.083% IN NEBU: 2.5000 mg | INHALATION_SOLUTION | Freq: Four times a day (QID) | RESPIRATORY_TRACT | Status: DC | PRN

## 2016-11-07 MED ORDER — NOREPINEPHRINE BITARTRATE 1 MG/ML IV SOLN
0.0000 ug/min | INTRAVENOUS | Status: DC
  Administered 2016-11-07: 2 ug/min via INTRAVENOUS
  Filled 2016-11-07 (×2): qty 4

## 2016-11-07 MED ORDER — LIDOCAINE HCL (PF) 1 % IJ SOLN
INTRAMUSCULAR | Status: AC
  Filled 2016-11-07: qty 30

## 2016-11-07 MED ORDER — FENTANYL CITRATE (PF) 100 MCG/2ML IJ SOLN
INTRAMUSCULAR | Status: DC
Start: 2016-11-07 — End: 2016-11-07
  Filled 2016-11-07: qty 2

## 2016-11-07 MED ORDER — FENTANYL CITRATE (PF) 100 MCG/2ML IJ SOLN
INTRAMUSCULAR | Status: DC | PRN
  Administered 2016-11-07 (×3): 50 ug via INTRAVENOUS
  Administered 2016-11-07: 100 ug via INTRAVENOUS

## 2016-11-07 MED ORDER — PROPOFOL 10 MG/ML IV BOLUS
INTRAVENOUS | Status: AC
  Filled 2016-11-07: qty 20

## 2016-11-07 MED ORDER — OXYCODONE HCL 5 MG PO TABS
20.0000 mg | ORAL_TABLET | Freq: Three times a day (TID) | ORAL | Status: DC
  Administered 2016-11-07 – 2016-11-08 (×2): 20 mg via ORAL
  Filled 2016-11-07 (×2): qty 4

## 2016-11-07 MED ORDER — LIDOCAINE HCL (CARDIAC) 20 MG/ML IV SOLN
INTRAVENOUS | Status: DC | PRN
  Administered 2016-11-07: 20 mg via INTRAVENOUS

## 2016-11-07 MED ORDER — SPIRONOLACTONE 25 MG PO TABS
12.5000 mg | ORAL_TABLET | Freq: Every day | ORAL | Status: DC
  Administered 2016-11-07 – 2016-11-08 (×2): 12.5 mg via ORAL
  Filled 2016-11-07 (×2): qty 1

## 2016-11-07 MED ORDER — LACTATED RINGERS IV SOLN
INTRAVENOUS | Status: DC
  Administered 2016-11-07 – 2016-11-08 (×2): via INTRAVENOUS

## 2016-11-07 MED ORDER — CARVEDILOL 25 MG PO TABS
25.0000 mg | ORAL_TABLET | Freq: Two times a day (BID) | ORAL | Status: DC
  Administered 2016-11-08: 25 mg via ORAL
  Filled 2016-11-07: qty 1

## 2016-11-07 MED ORDER — MEPERIDINE HCL 25 MG/ML IJ SOLN: 6.2500 mg | INTRAMUSCULAR | Status: DC | PRN

## 2016-11-07 MED ORDER — GENTAMICIN SULFATE 40 MG/ML IJ SOLN
INTRAMUSCULAR | Status: DC | PRN
  Administered 2016-11-07: 500 mL

## 2016-11-07 MED ORDER — ASPIRIN EC 81 MG PO TBEC
81.0000 mg | DELAYED_RELEASE_TABLET | Freq: Every day | ORAL | Status: DC
  Administered 2016-11-07 – 2016-11-08 (×2): 81 mg via ORAL
  Filled 2016-11-07 (×2): qty 1

## 2016-11-07 MED ORDER — FENTANYL CITRATE (PF) 100 MCG/2ML IJ SOLN
INTRAMUSCULAR | Status: AC
  Filled 2016-11-07: qty 2

## 2016-11-07 MED ORDER — ONDANSETRON HCL 4 MG/2ML IJ SOLN
INTRAMUSCULAR | Status: AC
  Filled 2016-11-07: qty 2

## 2016-11-07 MED ORDER — ONDANSETRON HCL 4 MG/2ML IJ SOLN: 4.0000 mg | Freq: Four times a day (QID) | INTRAMUSCULAR | Status: DC | PRN

## 2016-11-07 MED ORDER — MIDAZOLAM HCL 2 MG/2ML IJ SOLN
INTRAMUSCULAR | Status: AC
  Filled 2016-11-07: qty 2

## 2016-11-07 MED ORDER — ISOSORBIDE MONONITRATE ER 60 MG PO TB24
60.0000 mg | ORAL_TABLET | Freq: Two times a day (BID) | ORAL | Status: DC
  Administered 2016-11-07 – 2016-11-08 (×2): 60 mg via ORAL
  Filled 2016-11-07 (×2): qty 1

## 2016-11-07 MED ORDER — PROMETHAZINE HCL 25 MG/ML IJ SOLN: 6.2500 mg | INTRAMUSCULAR | Status: DC | PRN

## 2016-11-07 MED ORDER — FENTANYL CITRATE (PF) 100 MCG/2ML IJ SOLN
25.0000 ug | INTRAMUSCULAR | Status: DC | PRN
  Administered 2016-11-07 (×2): 50 ug via INTRAVENOUS
  Administered 2016-11-07 (×2): 25 ug via INTRAVENOUS

## 2016-11-07 MED ORDER — MIDAZOLAM HCL 2 MG/2ML IJ SOLN
0.5000 mg | Freq: Once | INTRAMUSCULAR | Status: AC | PRN
  Administered 2016-11-07: 0.5 mg via INTRAVENOUS

## 2016-11-07 MED ORDER — SODIUM CHLORIDE 0.9 % IR SOLN
Status: AC
  Filled 2016-11-07: qty 2

## 2016-11-07 MED ORDER — CEFAZOLIN SODIUM-DEXTROSE 2-4 GM/100ML-% IV SOLN
2.0000 g | Freq: Four times a day (QID) | INTRAVENOUS | Status: DC
  Administered 2016-11-07 – 2016-11-08 (×2): 2 g via INTRAVENOUS
  Filled 2016-11-07 (×3): qty 100

## 2016-11-07 MED ORDER — CHLORHEXIDINE GLUCONATE 4 % EX LIQD: 60.0000 mL | Freq: Once | CUTANEOUS | Status: DC

## 2016-11-07 MED ORDER — SODIUM CHLORIDE 0.9 % IV SOLN: INTRAVENOUS | Status: DC

## 2016-11-07 MED ORDER — ACETAMINOPHEN 325 MG PO TABS
325.0000 mg | ORAL_TABLET | ORAL | Status: DC | PRN
  Administered 2016-11-07 – 2016-11-08 (×2): 650 mg via ORAL
  Filled 2016-11-07 (×2): qty 2

## 2016-11-07 MED ORDER — ETOMIDATE 2 MG/ML IV SOLN
INTRAVENOUS | Status: DC | PRN
  Administered 2016-11-07: 20 mg via INTRAVENOUS

## 2016-11-07 MED ORDER — PHENYLEPHRINE HCL 10 MG/ML IJ SOLN
INTRAVENOUS | Status: DC | PRN
  Administered 2016-11-07: 10 ug/min via INTRAVENOUS

## 2016-11-07 MED ORDER — POTASSIUM 99 MG PO TABS: 99.0000 mg | ORAL_TABLET | Freq: Three times a day (TID) | ORAL | Status: DC | PRN

## 2016-11-07 MED ORDER — INFLUENZA VAC SPLIT QUAD 0.5 ML IM SUSY
0.5000 mL | PREFILLED_SYRINGE | INTRAMUSCULAR | Status: AC
  Administered 2016-11-08: 0.5 mL via INTRAMUSCULAR
  Filled 2016-11-07: qty 0.5

## 2016-11-07 MED ORDER — IBUPROFEN 600 MG PO TABS: 600.0000 mg | ORAL_TABLET | Freq: Four times a day (QID) | ORAL | Status: DC | PRN

## 2016-11-07 SURGICAL SUPPLY — 60 items
BAG BANDED W/RUBBER/TAPE 36X54 (MISCELLANEOUS) IMPLANT
BAG DECANTER FOR FLEXI CONT (MISCELLANEOUS) IMPLANT
BLADE 10 SAFETY STRL DISP (BLADE) ×3 IMPLANT
BLADE OSCILLATING /SAGITTAL (BLADE) ×3 IMPLANT
BLADE STERNUM SYSTEM 6 (BLADE) IMPLANT
BLADE SURG ROTATE 9660 (MISCELLANEOUS) ×3 IMPLANT
BNDG ADH 5X4 AIR PERM ELC (GAUZE/BANDAGES/DRESSINGS)
BNDG COHESIVE 4X5 WHT NS (GAUZE/BANDAGES/DRESSINGS) IMPLANT
CANISTER SUCTION 2500CC (MISCELLANEOUS) ×3 IMPLANT
CLOSURE STERI-STRIP 1/2X4 (GAUZE/BANDAGES/DRESSINGS) ×1
CLSR STERI-STRIP ANTIMIC 1/2X4 (GAUZE/BANDAGES/DRESSINGS) ×2 IMPLANT
COIL ONE TIE COMPRESSION (MISCELLANEOUS) ×6 IMPLANT
COVER TABLE BACK 60X90 (DRAPES) ×3 IMPLANT
DEVICE TORQUE KENDALL .025-038 (MISCELLANEOUS) ×3 IMPLANT
DRAPE C-ARM 42X72 X-RAY (DRAPES) IMPLANT
DRAPE CARDIOVASCULAR INCISE (DRAPES) ×2
DRAPE INCISE IOBAN 66X45 STRL (DRAPES) IMPLANT
DRAPE PROXIMA HALF (DRAPES) ×6 IMPLANT
DRAPE SRG 135X102X78XABS (DRAPES) ×1 IMPLANT
DRSG TEGADERM 4X4.75 (GAUZE/BANDAGES/DRESSINGS) ×3 IMPLANT
ELECT REM PT RETURN 9FT ADLT (ELECTROSURGICAL) ×6
ELECTRODE REM PT RTRN 9FT ADLT (ELECTROSURGICAL) ×2 IMPLANT
FELT TEFLON 1X6 (MISCELLANEOUS) IMPLANT
GAUZE SPONGE 4X4 12PLY STRL (GAUZE/BANDAGES/DRESSINGS) ×3 IMPLANT
GAUZE SPONGE 4X4 16PLY XRAY LF (GAUZE/BANDAGES/DRESSINGS) IMPLANT
GLOVE BIOGEL PI IND STRL 7.5 (GLOVE) ×2 IMPLANT
GLOVE BIOGEL PI INDICATOR 7.5 (GLOVE) ×4
GLOVE ECLIPSE 8.0 STRL XLNG CF (GLOVE) ×6 IMPLANT
GOWN STRL REUS W/ TWL LRG LVL3 (GOWN DISPOSABLE) ×3 IMPLANT
GOWN STRL REUS W/ TWL XL LVL3 (GOWN DISPOSABLE) ×1 IMPLANT
GOWN STRL REUS W/TWL LRG LVL3 (GOWN DISPOSABLE) ×9
GOWN STRL REUS W/TWL XL LVL3 (GOWN DISPOSABLE) ×3
GUIDEWIRE ANGLED .035X150CM (WIRE) ×3 IMPLANT
KIT ROOM TURNOVER OR (KITS) ×3 IMPLANT
LEAD ENDOTAK RELIANCE 0181 (Lead) ×3 IMPLANT
MEDTRONIC STERILE SLEEVE ×2 IMPLANT
NEEDLE PERC 18GX7CM (NEEDLE) ×3 IMPLANT
NS IRRIG 1000ML POUR BTL (IV SOLUTION) ×3 IMPLANT
PAD ARMBOARD 7.5X6 YLW CONV (MISCELLANEOUS) ×6 IMPLANT
PAD ELECT DEFIB RADIOL ZOLL (MISCELLANEOUS) ×3 IMPLANT
SHEATH CLASSIC 9F (SHEATH) ×3 IMPLANT
SHEATH CLASSIC 9F 25CM (SHEATH) ×6 IMPLANT
SHEATH EVOLUTION RL 11F (SHEATH) ×3 IMPLANT
SHEATH EVOLUTION SHORTE RL 11F (SHEATH) ×3 IMPLANT
SHEATH PINNACLE 6F 10CM (SHEATH) ×3 IMPLANT
SPONGE GAUZE 4X4 12PLY STER LF (GAUZE/BANDAGES/DRESSINGS) ×3 IMPLANT
STYLET LIBERATOR LOCKING (MISCELLANEOUS) ×3 IMPLANT
SUT PROLENE 2 0 CT2 30 (SUTURE) IMPLANT
SUT PROLENE 2 0 SH DA (SUTURE) IMPLANT
SUT SILK  1 MH (SUTURE) ×2
SUT SILK 1 MH (SUTURE) ×1 IMPLANT
SUT VIC AB 2-0 CT2 18 VCP726D (SUTURE) IMPLANT
SUT VIC AB 3-0 X1 27 (SUTURE) IMPLANT
TOWEL OR 17X24 6PK STRL BLUE (TOWEL DISPOSABLE) ×6 IMPLANT
TOWEL OR 17X26 10 PK STRL BLUE (TOWEL DISPOSABLE) ×6 IMPLANT
TRANSVALVULAR INSERTION TOOL (TVI) ×3 IMPLANT
TRAY FOLEY CATH 16FRSI W/METER (SET/KITS/TRAYS/PACK) ×3 IMPLANT
TUBE CONNECTING 12'X1/4 (SUCTIONS)
TUBE CONNECTING 12X1/4 (SUCTIONS) IMPLANT
YANKAUER SUCT BULB TIP NO VENT (SUCTIONS) IMPLANT

## 2016-11-07 NOTE — Anesthesia Postprocedure Evaluation (Signed)
Anesthesia Post Note  Patient: Chad [name]  Procedure(s) Performed: Procedure(s) (LRB): ICD LEAD REMOVAL, INSERTION OF NEW ICD LEAD (N/A)  Patient location during evaluation: PACU Anesthesia Type: General Level of consciousness: awake and alert Pain management: pain level controlled Vital Signs Assessment: post-procedure vital signs reviewed and stable Respiratory status: spontaneous breathing, nonlabored ventilation, respiratory function stable and patient connected to nasal cannula oxygen Cardiovascular status: blood pressure returned to baseline and stable Postop Assessment: no signs of nausea or vomiting Anesthetic complications: no    Last Vitals:  Vitals:   11/07/16 1915 11/07/16 1918  BP:  129/85  Pulse: 71 69  Resp: 14 14  Temp:      Last Pain:  Vitals:   11/07/16 1900  TempSrc:   PainSc: 10-Worst pain ever                 Aldon Hengst S

## 2016-11-07 NOTE — Progress Notes (Signed)
Orthopedic Tech Progress Note Patient Details:  Chad [name] February 26, 1961 [id number]  Ortho Devices Type of Ortho Device: Arm sling Ortho Device/Splint Location: LUE Ortho Device/Splint Interventions: Ordered, Application   Braulio Bosch 11/07/2016, 8:20 PM

## 2016-11-07 NOTE — Anesthesia Procedure Notes (Signed)
Procedure Name: Intubation Date/Time: 11/07/2016 3:24 PM Performed by: Sampson Si E Pre-anesthesia Checklist: Patient identified, Emergency Drugs available, Suction available and Patient being monitored Patient Re-evaluated:Patient Re-evaluated prior to inductionOxygen Delivery Method: Circle System Utilized Preoxygenation: Pre-oxygenation with 100% oxygen Intubation Type: IV induction Ventilation: Mask ventilation without difficulty Laryngoscope Size: Mac and 4 Grade View: Grade I Tube type: Oral Tube size: 8.0 mm Number of attempts: 1 Airway Equipment and Method: Stylet and Oral airway Placement Confirmation: ETT inserted through vocal cords under direct vision,  positive ETCO2 and breath sounds checked- equal and bilateral Secured at: 23 cm Tube secured with: Tape Dental Injury: Teeth and Oropharynx as per pre-operative assessment

## 2016-11-07 NOTE — Progress Notes (Signed)
Pt received to 2w12. Tele box placed, verified x2. VSS. Complaing of 10/10 pain at L chest/arm. Given scheduled Oxy IR 20mg . Call bell within reach. Patient and wife updated on plan of care. Will continue to monitor.

## 2016-11-07 NOTE — Transfer of Care (Signed)
Immediate Anesthesia Transfer of Care Note  Patient: Chad [name]  Procedure(s) Performed: Procedure(s) with comments: ICD LEAD REMOVAL, INSERTION OF NEW ICD LEAD (N/A) - Dr. Prescott Gum to backup case  Patient Location: PACU  Anesthesia Type:General  Level of Consciousness: awake  Airway & Oxygen Therapy: Patient Spontanous Breathing and Patient connected to nasal cannula oxygen  Post-op Assessment: Report given to RN  Post vital signs: Reviewed and stable  Last Vitals:  Vitals:   11/07/16 1157 11/07/16 1803  BP: 128/87   Pulse: 61   Resp: 18   Temp: 36.6 C 36.6 C    Last Pain:  Vitals:   11/07/16 1216  TempSrc:   PainSc: 2       Patients Stated Pain Goal: 3 ([id number])  Complications: No apparent anesthesia complications

## 2016-11-07 NOTE — Interval H&P Note (Signed)
History and Physical Interval Note:  11/07/2016 12:34 PM  Chad [name]  has presented today for surgery, with the diagnosis of ICD Lead Fracture  The various methods of treatment have been discussed with the patient and family. After consideration of risks, benefits and other options for treatment, the patient has consented to  Procedure(s) with comments: ICD LEAD REMOVAL (N/A) - Dr. Prescott Gum to backup case as a surgical intervention .  The patient's history has been reviewed, patient examined, no change in status, stable for surgery.  I have reviewed the patient's chart and labs.  Questions were answered to the patient's satisfaction.     Cristopher Peru

## 2016-11-07 NOTE — H&P (View-Only) (Signed)
HPI Chad [name] returns today for followup. He is a 55 year old man with an ischemic cardiomyopathy, status post bypass surgery, with chronic systolic heart failure, ejection fraction 25%. The patient is status post ICD implantation.  He had several ICD shocks last week and was found to have a fractured rate/sense portion of his lead. His device has been turned off. He has never had an ICD shock prior to this. He has an EF of 20% by echo less than a year ago. He has had longstanding LV dysfunction since his remote MI. His heart failure symptoms are class 2A.  Allergies  Allergen Reactions  . Iohexol Anaphylaxis and Other (See Comments)     Desc: PT HAS ANAPHYLAXIS WITH CONTRAST MEDIA!  . Lipitor [Atorvastatin Calcium] Anaphylaxis and Other (See Comments)    Large doses  . Shellfish Allergy Anaphylaxis  . Sulfonamide Derivatives Shortness Of Breath  . Sulfa Antibiotics Swelling  . Latex Rash    With long periods of exposure  . Zocor [Simvastatin] Other (See Comments)    Muscle cramps     Current Outpatient Prescriptions  Medication Sig Dispense Refill  . albuterol (PROVENTIL HFA;VENTOLIN HFA) 108 (90 Base) MCG/ACT inhaler Inhale 2 puffs into the lungs every 6 (six) hours as needed for wheezing or shortness of breath. 1 Inhaler 0  . amLODipine (NORVASC) 5 MG tablet Take 5 mg by mouth as directed. Pt takes up to 2 tablets daily    . aspirin EC 81 MG tablet Take 1 tablet (81 mg total) by mouth daily. 90 tablet 3  . carvedilol (COREG) 25 MG tablet Take 1 tablet (25 mg total) by mouth 2 (two) times daily with a meal. 60 tablet 6  . clopidogrel (PLAVIX) 75 MG tablet TAKE ONE TABLET BY MOUTH ONCE DAILY 30 tablet 3  . cyclobenzaprine (FLEXERIL) 10 MG tablet Take 10 mg by mouth daily as needed for muscle spasms.     . diphenhydrAMINE (BENADRYL) 25 MG tablet Take 12.5 mg by mouth 2 (two) times daily as needed for itching or allergies. Reported on 01/13/2016    . Flaxseed, Linseed, (FLAXSEED OIL PO)  Take 2 capsules by mouth 2 (two) times daily.    . furosemide (LASIX) 40 MG tablet Take 40 mg by mouth 2 (two) times daily as needed for edema.    . hydrALAZINE (APRESOLINE) 50 MG tablet TAKE ONE & ONE-HALF TABLETS BY MOUTH THREE TIMES DAILY 135 tablet 6  . ibuprofen (ADVIL,MOTRIN) 200 MG tablet Take 600 mg by mouth every 6 (six) hours as needed (pain). Pt rarely takes    . isosorbide mononitrate (IMDUR) 60 MG 24 hr tablet Take 1 tablet (60 mg total) by mouth 2 (two) times daily. 60 tablet 6  . Multiple Vitamin (MULTIVITAMIN WITH MINERALS) TABS tablet Take 1 tablet by mouth daily. Mega Men 50 Plus    . nitroGLYCERIN (NITROSTAT) 0.4 MG SL tablet Place 1 tablet (0.4 mg total) under the tongue every 5 (five) minutes x 3 doses as needed for chest pain. 30 tablet 12  . Oxycodone HCl 10 MG TABS Take 10 mg by mouth 3 (three) times daily.     . potassium chloride (K-DUR) 10 MEQ tablet Take 1 tablet (10 mEq total) by mouth daily. 30 tablet 6  . Spacer/Aero-Holding Chambers (AEROCHAMBER PLUS) inhaler Use as instructed 1 each 0  . spironolactone (ALDACTONE) 25 MG tablet TAKE ONE-HALF TABLET BY MOUTH ONCE DAILY 30 tablet 1  . Turmeric (CURCUMIN 95 PO) Take 2 tablets by mouth  2 (two) times daily.      No current facility-administered medications for this visit.      Past Medical History:  Diagnosis Date  . AICD (automatic cardioverter/defibrillator) present   . Anemia   . Aortic dissection, thoracoabdominal ([place])    7/10: Type I s/p repair  . CAD (coronary artery disease)    a. s/p CABG 2006;  b. DES to PDA 2011 (cath: Dx not seen, dRCA/PDA tx with DES; S-PDA occluded (culprit), S-Dx occluded, S-RI and OM ok, L-LAD ok  . Carotid stenosis    dopplers 2011: 0-39% bilat.  . Chest pain syndrome   . CHF (congestive heart failure) ([place])   . Chronic systolic heart failure (H[facility])    a. 12/13 ECHO: EF 35-40%, sept, apical & posterobasal HK, LV mod dil & sys fx mod reduced, mild AI, MV mild reg, TV mild reg   . CRI (chronic renal insufficiency)   . Gout   . HLD (hyperlipidemia)   . HTN (hypertension)    severe  . Thyroid cancer ([place])    [employer]    ROS:   All systems reviewed and negative except as noted in the HPI.   Past Surgical History:  Procedure Laterality Date  . CARDIAC DEFIBRILLATOR PLACEMENT     Chemical engineer  . CORONARY ARTERY BYPASS GRAFT  06/21/2009   x2  . IMPLANTABLE CARDIOVERTER DEFIBRILLATOR (ICD) GENERATOR CHANGE N/A 12/13/2012   Procedure: ICD GENERATOR CHANGE;  Surgeon: Evans Lance, MD;  Location: [facility] CATH LAB;  Service: Cardiovascular;  Laterality: N/A;  . LUMBAR DISC SURGERY     x 2, most recent within 5-10 years  . MEDIAN STERNOTOMY,CABG X 7  06/29/2005  . Status post emergency repair of a type A ascending aortic dissection with a hemiarch reconstruction of the ascending aorta  using a 28-mm Hemashield graft with redo sternotomy and revision of previous bypass grafts in June 2010.    . THYROIDECTOMY, PARTIAL  06/20/11     Family History  Problem Relation Age of Onset  . Hypertension Father   . Heart disease Father   . Early death Father   . COPD Father   . Hypertension Mother   . Coronary artery disease       Social History   Social History  . Marital status: Divorced    Spouse name: N/A  . Number of children: N/A  . Years of education: N/A   Occupational History  . disabled    Social History Main Topics  . Smoking status: Never Smoker  . Smokeless tobacco: Never Used  . Alcohol use No  . Drug use: No  . Sexual activity: Yes   Other Topics Concern  . Not on file   Social History Narrative   Engaged and lives with fiancee and 4 kids.    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      BP 120/72   Pulse 76   Ht 5\' 11"  (1.803 m)   Wt 185 lb (83.9 kg)   BMI 25.80 kg/m   Physical Exam:  Chronically ill appearing middle-aged man, NAD HEENT: Unremarkable Neck:  7 cm JVD, no thyromegally Lungs:  Clear except for  rales in the bases bilaterally. No wheezes or rhonchi. No increased work of breathing. HEART:  Regular rate rhythm, an intermittant MR murmur appears present with premature beats, no rubs, no clicks, Abd:  soft, positive bowel sounds, no organomegally, no rebound, no guarding Ext:  2 plus pulses, no edema,  no cyanosis, no clubbing Skin:  No rashes no nodules Neuro:  CN II through XII intact, motor grossly intact  DEVICE  Normal device function.  See PaceArt for details.  Assess/Plan:  1. Broken ICD lead - his device remains off. Will schedule ICD lead extraction. Will place a new ICD if his longevity is less than 5 years. I have discussed the risks/benefits/goals/expectations of ICD lead removal via transvenous extraction and insertion of a new ICD lead and he wishes to proceed. 2. ICM - he has no anginal symptoms. Will follow. 3. Chronic systolic heart failure - his symptoms are class 2. He will continue his current meds. 4. HTN - his blood pressure is well controlled on medical therapy.  Chad [name].D.

## 2016-11-07 NOTE — Progress Notes (Signed)
CT Surgery  AICD lead extraction completed without apparent complicatrions Introperative surgical backup provided for 1 hr 20 minutes P Prescott Gum MD

## 2016-11-07 NOTE — Progress Notes (Signed)
  Echocardiogram Echocardiogram Transesophageal has been performed.  Bobbye Charleston 11/07/2016, 3:51 PM

## 2016-11-07 NOTE — Anesthesia Preprocedure Evaluation (Addendum)
Anesthesia Evaluation  Patient identified by MRN, date of birth, ID band Patient awake    Reviewed: Allergy & Precautions, NPO status , Patient's Chart, lab work & pertinent test results  History of Anesthesia Complications Negative for: history of anesthetic complications  Airway Mallampati: II  TM Distance: >3 FB Neck ROM: Full    Dental  (+) Dental Advisory Given, Missing   Pulmonary asthma , COPD,  COPD inhaler,    breath sounds clear to auscultation       Cardiovascular hypertension, Pt. on medications and Pt. on home beta blockers (-) angina+ CAD, + Cardiac Stents, + CABG and + Peripheral Vascular Disease (s/p thoracoabdominal dissection repair)  + Cardiac Defibrillator (now deactivated)  Rhythm:Regular Rate:Normal  '16 ECHO: EF 20% to 25%. Diffuse hypokinesis, akinesis of the inferolateral and inferior myocardium, mild AI, mild MR, mild TR  '16 stress: Evidence for reversibility and ischemia involving the anterolateral wall and a portion of the lateral wall. Large infarct involving the inferior wall, dyskinesia in the inferior wall. EF 32%.     Neuro/Psych negative neurological ROS     GI/Hepatic negative GI ROS, Neg liver ROS,   Endo/Other  negative endocrine ROS  Renal/GU Renal InsufficiencyRenal disease (creat 1.43)     Musculoskeletal   Abdominal   Peds  Hematology negative hematology ROS (+) plavix   Anesthesia Other Findings   Reproductive/Obstetrics                            Anesthesia Physical Anesthesia Plan  ASA: IV  Anesthesia Plan: General   Post-op Pain Management:    Induction: Intravenous  Airway Management Planned: Oral ETT  Additional Equipment: Arterial line and TEE  Intra-op Plan:   Post-operative Plan: Possible Post-op intubation/ventilation  Informed Consent: I have reviewed the patients History and Physical, chart, labs and discussed the  procedure including the risks, benefits and alternatives for the proposed anesthesia with the patient or authorized representative who has indicated his/her understanding and acceptance.   Dental advisory given  Plan Discussed with: CRNA and Surgeon  Anesthesia Plan Comments: (Plan routine monitors, A line, GETA with TEE and possivle post op ventilation)        Anesthesia Quick Evaluation

## 2016-11-07 NOTE — Progress Notes (Signed)
The patient was examined and preop studies reviewed.  Will provide surgical backup for AICD lead extraction- broken leads Patient s/p emergent redo sternotomy for repair type A dissection in 2010 - prior CABG Last echo 2016 - EF 20-25% with min AI    the patient is ready for surgery.

## 2016-11-07 NOTE — Op Note (Signed)
EP procedure note  Procedure performed: Extraction of a active fixation defibrillation lead, insertion of a new defibrillation lead and ICD pocket revision  Preoperative diagnosis: Chronic systolic heart failure, ischemic cardiomyopathy, ejection fraction 20%, status post ICD implantation, with the ICD lead having fractured, resulting in multiple ICD shocks  Postoperative diagnosis: Same as preoperative diagnosis  Description of the procedure: After informed consent was obtained, the patient was taken to the operating room in the fasting state. The anesthesia service was utilized for general endotracheal anesthesia. Invasive arterial hemodynamic monitoring was carried out using the right radial artery. After the usual preparation and draping, a 6 French femoral venous sheath was inserted percutaneously. Attention was then turned to the ICD pocket. 30 cc of lidocaine was infiltrated into the pocket. A 7 cm incision was carried out. Electrocautery was utilized to dissect down to the ICD generator. It was removed with gentle traction. The broken ICD lead was disconnected. Electrocautery was utilized to free up the fibrous adhesions in the pocket. A stylette was inserted into the lead body and advanced to the tip of the lead. Attempts to retract the helix were carried out. There was not much retraction. The lead body was cut. A Liberator locking stylette was inserted into the lead body and locked in place. Over one tie suture was placed on the proximal portion of the lead. An 69 French Cook RL short dissection sheath was inserted into the vein over the lead body. There is very dense fibrous adhesions at the origin of the left subclavian vein. With the combination of traction and countertraction, pressure and counter pressure, the short sheath was able to traverse the dense fibrous binding sites down to the innominate vein. The 11 French Cook RL long dissection sheath was then inserted after the short sheath had  been removed. A combination of traction and countertraction, pressure and counter pressure, were applied to the lead by way of the long sheath, removing the defibrillator lead in total. The lead is been in place 11 years. The sheath was left in place and a Glidewire was inserted through the sheath into the central circulation. A 9 French long hemostatic sheath was then inserted and the [employer] active fixation single coil defibrillator lead, serial [id number], was advanced into the right ventricle near the RV apical septum. R waves measured 16 mV and the lead was actively fixed. There was large current of injury. The pacing impedance was a proximally 600 ohms. The pacing threshold was 0.6 V at 0.4 ms. 10 V pacing did not stimulate the diaphragm. Evaluation of the lead and generator demonstrated a shocking impedance of 53 ohms. At this point, the pocket was irrigated with antibiotic irrigation. Because this was a third operation that this patient had received, it was deemed most appropriate to place the new lead, and old device under the pectoralis muscle. A combination of blunt dissection and electrocautery was utilized to dissect down through the pectoralis major and after achievement of hemostasis, the previously implanted [employer] defibrillator with the new [place] scientific defibrillator lead was inserted into the subpectoral pocket. The pocket was irrigated with antibiotic irrigation. The pectoralis major was closed with Vicryl suture. The subcutaneous and skin tissues were also closed with Vicryl suture. Benzoin and Steri-Strips were painted on the skin. The 6 French sheath was removed, and the patient was returned to the recovery area in stable condition.  Complications: There were no immediate procedure complications  Conclusion: Successful extraction of a 53 year old active fixation defibrillation  lead, successful insertion of a new active fixation defibrillation lead with device  evaluation and relocation of the patient's pocket to a subpectoral location. Because the old device had an expected 10 year battery longevity, the decision was made to use this device rather than insert a new device.  Cristopher Peru, M.D.

## 2016-11-08 ENCOUNTER — Encounter: Payer: Self-pay | Admitting: Cardiology

## 2016-11-08 ENCOUNTER — Observation Stay (HOSPITAL_COMMUNITY): Payer: Medicaid Other

## 2016-11-08 DIAGNOSIS — T82110D Breakdown (mechanical) of cardiac electrode, subsequent encounter: Secondary | ICD-10-CM | POA: Diagnosis not present

## 2016-11-08 DIAGNOSIS — T82110A Breakdown (mechanical) of cardiac electrode, initial encounter: Secondary | ICD-10-CM | POA: Diagnosis not present

## 2016-11-08 DIAGNOSIS — I255 Ischemic cardiomyopathy: Secondary | ICD-10-CM | POA: Diagnosis not present

## 2016-11-08 DIAGNOSIS — I130 Hypertensive heart and chronic kidney disease with heart failure and stage 1 through stage 4 chronic kidney disease, or unspecified chronic kidney disease: Secondary | ICD-10-CM | POA: Diagnosis not present

## 2016-11-08 DIAGNOSIS — I2510 Atherosclerotic heart disease of native coronary artery without angina pectoris: Secondary | ICD-10-CM | POA: Diagnosis not present

## 2016-11-08 NOTE — Discharge Instructions (Signed)
° ° °  Supplemental Discharge Instructions for  Pacemaker/Defibrillator Patients  Activity No heavy lifting or vigorous activity with your left/right arm for 6 to 8 weeks.  Do not raise your left/right arm above your head for one week.  Gradually raise your affected arm as drawn below.           __       11/12/16                 11/13/16                  11/14/16               11/15/16  NO DRIVING for   1 week  ; you may begin driving on  [id number]   .  WOUND CARE - Keep the wound area clean and dry.  Do not get this area wet for one week. No showers for one week; you may shower on  11/15/16   . - The tape/steri-strips on your wound will fall off; do not pull them off.  No bandage is needed on the site.  DO  NOT apply any creams, oils, or ointments to the wound area. - If you notice any drainage or discharge from the wound, any swelling or bruising at the site, or you develop a fever > 101? F after you are discharged home, call the office at once.  Special Instructions - You are still able to use cellular telephones; use the ear opposite the side where you have your pacemaker/defibrillator.  Avoid carrying your cellular phone near your device. - When traveling through airports, show security personnel your identification card to avoid being screened in the metal detectors.  Ask the security personnel to use the hand wand. - Avoid arc welding equipment, MRI testing (magnetic resonance imaging), TENS units (transcutaneous nerve stimulators).  Call the office for questions about other devices. - Avoid electrical appliances that are in poor condition or are not properly grounded. - Microwave ovens are safe to be near or to operate.  Additional information for defibrillator patients should your device go off: - If your device goes off ONCE and you feel fine afterward, notify the device clinic nurses. - If your device goes off ONCE and you do not feel well afterward, call 911. - If your device  goes off TWICE, call 911. - If your device goes off THREE times in one day, call 911.  DO NOT DRIVE YOURSELF OR A FAMILY MEMBER WITH A DEFIBRILLATOR TO THE HOSPITAL--CALL 911.

## 2016-11-08 NOTE — Discharge Summary (Signed)
ELECTROPHYSIOLOGY PROCEDURE DISCHARGE SUMMARY    Patient ID: Chad [name],  MRN: [id number], DOB/AGE: [id number] 53 y.o.  Admit date: 11/07/2016 Discharge date: 11/08/2016  Primary Care Physician: [name], MD Primary Cardiologist: [place] Electrophysiologist: Lovena Le  Primary Discharge Diagnosis:  Fractured RV lead s/p extraction of previously implanted RV lead, insertion of new RV lead this admission  Secondary Discharge Diagnosis:  1.  CAD  2.  ICM 3.  Chronic systolic heart failure 4.  HTN  Allergies  Allergen Reactions  . Iohexol Anaphylaxis and Other (See Comments)     Desc: PT HAS ANAPHYLAXIS WITH CONTRAST MEDIA!  . Lipitor [Atorvastatin Calcium] Anaphylaxis and Other (See Comments)    Large doses  . Shellfish Allergy Anaphylaxis  . Sulfonamide Derivatives Shortness Of Breath  . Sulfa Antibiotics Swelling  . Latex Rash    With long periods of exposure  . Zocor [Simvastatin] Other (See Comments)    Muscle cramps     Procedures This Admission:  1.  Extraction of a previously implanted RV lead and insertion of new RV lead on 11/07/16 by Dr Lovena Le.  See op note for full details. There were no immediate post procedure complications. 2.  CXR on 11/08/16 demonstrated no pneumothorax status post device implantation.   Brief HPI: Chad [name] is a 55 y.o. male with a past medical history as outlined above. He received several inappropriate ICD shocks for fractured RV lead.  Risks, benefits to RV lead extraction and insertion of new RV lead were reviewed with the patient who wished to proceed.   Hospital Course:  The patient was admitted and underwent extraction of previously implanted RV lead and insertion of new RV lead with details as outlined above. He was monitored on telemetry overnight which demonstrated sinus rhythm.  Left chest was without hematoma or ecchymosis.  The device was interrogated and found to be functioning normally.  CXR was  obtained and demonstrated no pneumothorax status post device implantation.  Wound care, arm mobility, and restrictions were reviewed with the patient.  The patient was examined and considered stable for discharge to home.   The patient's discharge medications include a beta blocker (carvedilol). No ACE-I with CKD and solitary kidney  Physical Exam: Vitals:   11/07/16 1918 11/07/16 1930 11/07/16 1945 11/08/16 0359  BP: 129/85  137/90 122/72  Pulse: 69 73 68 87  Resp: 14 13 12 20   Temp:    99.5 F (37.5 C)  TempSrc:    Oral  SpO2: 98% 96% 100% 96%  Weight:        GEN- The patient is well appearing, alert and oriented x 3 today.   HEENT: normocephalic, atraumatic; sclera clear, conjunctiva pink; hearing intact; oropharynx clear; neck supple  Lungs- Clear to ausculation bilaterally, normal work of breathing.  No wheezes, rales, rhonchi Heart- Regular rate and rhythm, no murmurs, rubs or gallops  GI- soft, non-tender, non-distended, bowel sounds present  Extremities- no clubbing, cyanosis, or edema except his left arm is swollen.  MS- no significant deformity or atrophy Skin- warm and dry, no rash or lesion, left chest without ecchymosis, +soft small hematoma Psych- euthymic mood, full affect Neuro- strength and sensation are intact   Labs:   Lab Results  Component Value Date   WBC 3.4 (L) 10/31/2016   HGB 12.7 (L) 10/31/2016   HCT 37.9 (L) 10/31/2016   MCV 80.5 10/31/2016   PLT 130 (L) 10/31/2016   No results for input(s): NA, K, CL, CO2, BUN, CREATININE,  CALCIUM, PROT, BILITOT, ALKPHOS, ALT, AST, GLUCOSE in the last 168 hours.  Invalid input(s): LABALBU  Discharge Medications:    Medication List    TAKE these medications   AEROCHAMBER PLUS inhaler Use as instructed   albuterol 108 (90 Base) MCG/ACT inhaler Commonly known as:  PROVENTIL HFA;VENTOLIN HFA Inhale 2 puffs into the lungs every 6 (six) hours as needed for wheezing or shortness of breath.   amLODipine 5 MG  tablet Commonly known as:  NORVASC Take 5-10 mg by mouth daily. Depends on blood pressure readings at noon if patient takes 1 or 2 tablets   aspirin EC 81 MG tablet Take 1 tablet (81 mg total) by mouth daily.   carvedilol 25 MG tablet Commonly known as:  COREG TAKE ONE TABLET BY MOUTH TWICE DAILY WITH A MEAL   clopidogrel 75 MG tablet Commonly known as:  PLAVIX TAKE ONE TABLET BY MOUTH ONCE DAILY   CURCUMIN 95 PO Take 2 tablets by mouth 2 (two) times daily.   cyclobenzaprine 10 MG tablet Commonly known as:  FLEXERIL Take 10 mg by mouth daily as needed for muscle spasms.   FLAXSEED OIL PO Take 2 capsules by mouth 2 (two) times daily.   furosemide 40 MG tablet Commonly known as:  LASIX Take 40 mg by mouth 2 (two) times daily as needed for edema.   GERITOL COMPLETE PO Take 2 tablets by mouth daily.   hydrALAZINE 50 MG tablet Commonly known as:  APRESOLINE TAKE ONE & ONE-HALF TABLETS BY MOUTH THREE TIMES DAILY   ibuprofen 200 MG tablet Commonly known as:  ADVIL,MOTRIN Take 600 mg by mouth every 6 (six) hours as needed (pain). Pt rarely takes   isosorbide mononitrate 60 MG 24 hr tablet Commonly known as:  IMDUR Take 1 tablet (60 mg total) by mouth 2 (two) times daily.   naphazoline 0.1 % ophthalmic solution Commonly known as:  NAPHCON Place 1 drop into both eyes 4 (four) times daily as needed for irritation.   nitroGLYCERIN 0.4 MG SL tablet Commonly known as:  NITROSTAT Place 1 tablet (0.4 mg total) under the tongue every 5 (five) minutes x 3 doses as needed for chest pain.   Oxycodone HCl 10 MG Tabs Take 20 mg by mouth 3 (three) times daily.   Potassium 99 MG Tabs Take 99 mg by mouth 3 (three) times daily as needed (muscle cramps).   potassium chloride 10 MEQ tablet Commonly known as:  K-DUR Take 1 tablet (10 mEq total) by mouth daily.   spironolactone 25 MG tablet Commonly known as:  ALDACTONE TAKE ONE-HALF TABLET BY MOUTH ONCE DAILY        Disposition:  Discharge Instructions    Diet - low sodium heart healthy    Complete by:  As directed    Increase activity slowly    Complete by:  As directed      Follow-up Information    [place] [facility] Follow up on 11/20/2016.   Specialty:  Cardiology Why:  at [facility] for wound check  Contact information: [address], [place], [place] Follow up on 02/09/2017.   Specialty:  Cardiology Why:  at [place] information: [address] [place] [address] [phone]           Duration of Discharge Encounter: Greater than 30 minutes including physician time.  Signed, Chanetta Marshall, NP 11/08/2016 6:37 AM  EP Attending  Patient seen and examined. Agree with above. His left arm is swollen after his extensive procedure and he is very sore after pocket revision to a subpectoral location. Will DC with some Tylenol#3. He can return to work on Monday. He is instructed to keep his arm elevated to help with the swelling. We cannot anti-coagulate with a fresh pocket in subpectoral location or he will bleed. Will hold plavix until his wound check.   Mikle Bosworth.D.

## 2016-11-08 NOTE — Progress Notes (Signed)
Patient voided spontaneously without difficulty.

## 2016-11-08 NOTE — Progress Notes (Signed)
Foley catheter was removed without difficulty as per MD orders.

## 2016-11-08 NOTE — Progress Notes (Signed)
Order received to discharge patient.  Patient expresses readiness to discharge.  Discharge instructions, follow up, medications and instructions for their use were discussed with patient and wife and they voiced understanding.  Telemetry monitor was removed and CCMD notified.  PIV access removed without complication.

## 2016-11-09 ENCOUNTER — Telehealth: Payer: Self-pay | Admitting: Internal Medicine

## 2016-11-09 LAB — TYPE AND SCREEN
ABO/RH(D): O POS
ANTIBODY SCREEN: NEGATIVE
UNIT DIVISION: 0
Unit division: 0

## 2016-11-09 NOTE — Telephone Encounter (Signed)
Called, spoke with both pt and his girlfriend, Vaughan Basta. Pt gave verbal authorization to speak with Vaughan Basta. Pt stated he need to take his Lasix and wanted to make sure it is okay to do so.Pt stated he had a slight edema and SOB.  I reviewed d/c note from 11/08/16. Advised pt to take Lasix as prescribed - 40 mg twice daily as needd for edema. Advised pt to call our office if Lasix does not improve symptoms. Pt verbalized understand and agreed with plan.

## 2016-11-09 NOTE — Telephone Encounter (Signed)
New Message  Ms. Linda call requesting to speak with RN about pt stating back on his lasix. Ms. Vaughan Basta states pt needs to take his medication. Please call back to discuss

## 2016-11-14 ENCOUNTER — Encounter (HOSPITAL_COMMUNITY): Payer: Self-pay | Admitting: Internal Medicine

## 2016-11-19 ENCOUNTER — Other Ambulatory Visit: Payer: Self-pay | Admitting: Family Medicine

## 2016-11-20 ENCOUNTER — Other Ambulatory Visit (HOSPITAL_COMMUNITY): Payer: Self-pay | Admitting: *Deleted

## 2016-11-20 ENCOUNTER — Ambulatory Visit: Payer: Medicaid Other

## 2016-11-20 MED ORDER — CLOPIDOGREL BISULFATE 75 MG PO TABS: 75.0000 mg | ORAL_TABLET | Freq: Every day | ORAL | 3 refills | Status: DC

## 2016-11-26 ENCOUNTER — Other Ambulatory Visit (HOSPITAL_COMMUNITY): Payer: Self-pay | Admitting: Internal Medicine

## 2016-11-26 DIAGNOSIS — I5043 Acute on chronic combined systolic (congestive) and diastolic (congestive) heart failure: Secondary | ICD-10-CM

## 2016-11-26 DIAGNOSIS — I10 Essential (primary) hypertension: Secondary | ICD-10-CM

## 2016-11-29 ENCOUNTER — Ambulatory Visit (INDEPENDENT_AMBULATORY_CARE_PROVIDER_SITE_OTHER): Payer: Medicaid Other | Admitting: *Deleted

## 2016-11-29 DIAGNOSIS — Z95810 Presence of automatic (implantable) cardiac defibrillator: Secondary | ICD-10-CM

## 2016-11-29 LAB — CUP PACEART INCLINIC DEVICE CHECK
Date Time Interrogation Session: 20171206050000
HIGH POWER IMPEDANCE MEASURED VALUE: 34 Ohm
HIGH POWER IMPEDANCE MEASURED VALUE: 54 Ohm
Implantable Lead Implant Date: 20171114
Implantable Lead Model: 181
Implantable Lead Serial Number: 333496
Implantable Pulse Generator Implant Date: 20131220
Lead Channel Impedance Value: 511 Ohm
Lead Channel Pacing Threshold Amplitude: 0.4 V
Lead Channel Pacing Threshold Pulse Width: 0.4 ms
Lead Channel Setting Pacing Amplitude: 3.5 V
Lead Channel Setting Sensing Sensitivity: 0.5 mV
MDC IDC LEAD LOCATION: 753860
MDC IDC MSMT LEADCHNL RV SENSING INTR AMPL: 16.7 mV
MDC IDC SET LEADCHNL RV PACING PULSEWIDTH: 0.4 ms
Pulse Gen Serial Number: 124654

## 2016-11-29 NOTE — Progress Notes (Signed)
Wound check appointment. Steri-strips removed by patient at home. Wound without redness or edema. Incision edges approximated, wound well healed. Normal device function. Threshold, sensing, and impedance consistent with implant measurements. Device programmed at 3.5V for extra safety margin until 3 month visit. Histogram distribution appropriate for patient and level of activity. No ventricular arrhythmias noted. Patient educated about wound care, arm mobility, lifting restrictions, shock plan. ROV with GT 2/16

## 2016-12-08 IMAGING — US US ABDOMEN LIMITED
1 series · 14 of 25 positions shown · non-contrast
Comparison: CT abdomen and pelvis 03/21/2015

CLINICAL DATA: Chest pain for 1 day.

EXAM:
US ABDOMEN LIMITED - RIGHT UPPER QUADRANT

[Series 1: us abdomen limited · 0.20mm/px · 14 of 53 slices shown]
[im 1/53]
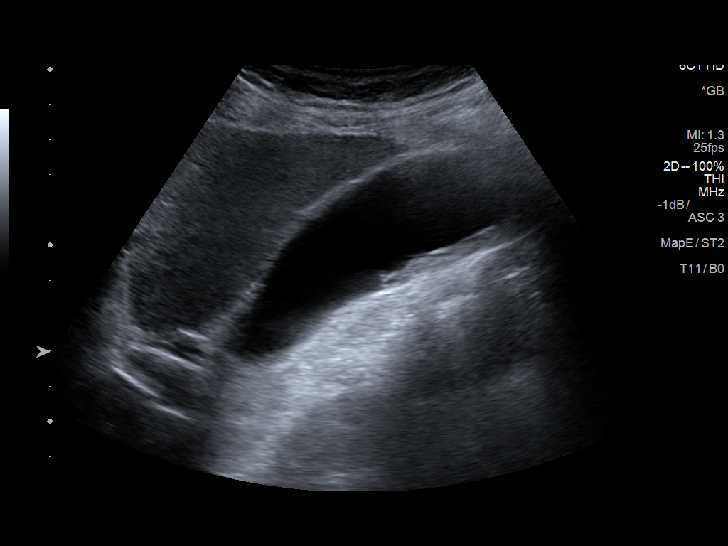
[im 5/53]
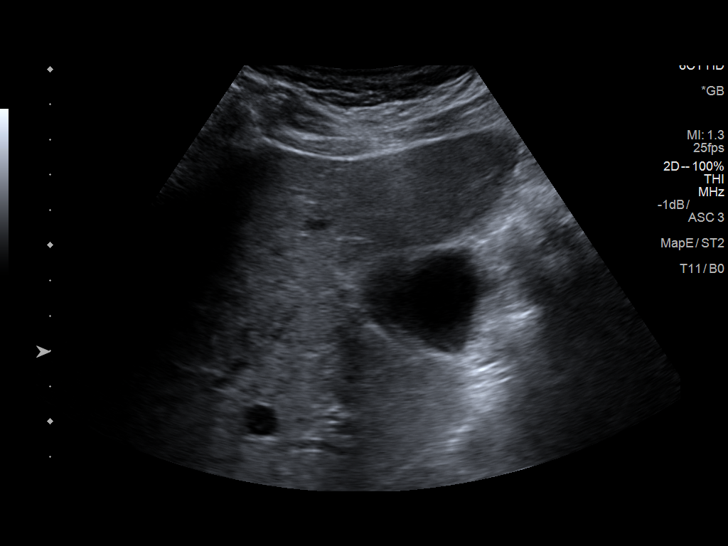
[im 9/53]
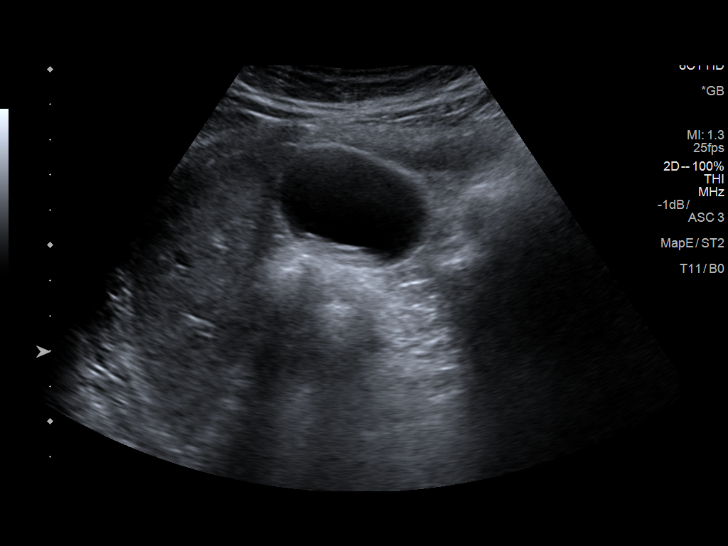
[im 14/53]
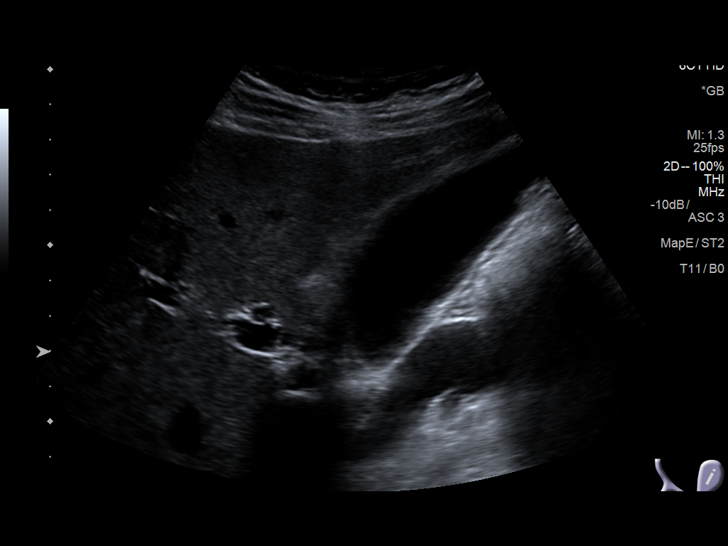
[im 18/53]
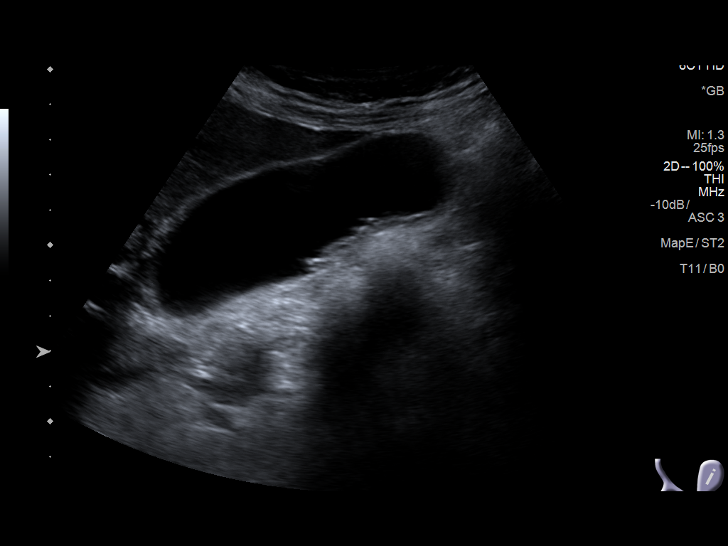
[im 20/53]
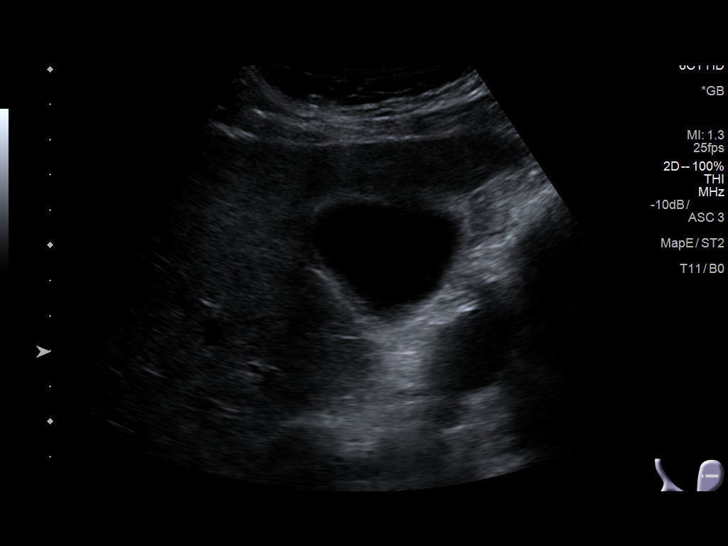
[im 24/53]
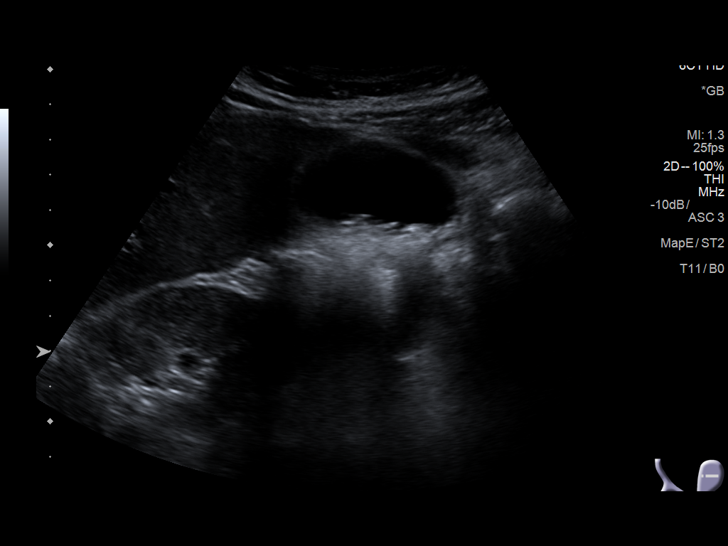
[im 29/53]
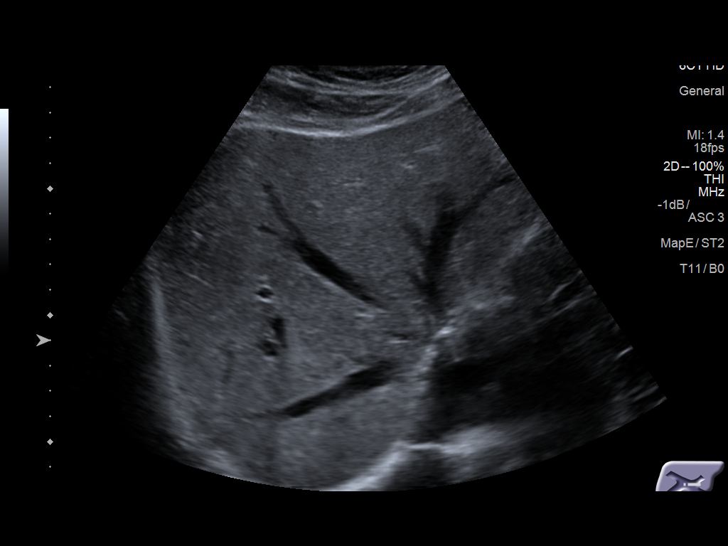
[im 33/53]
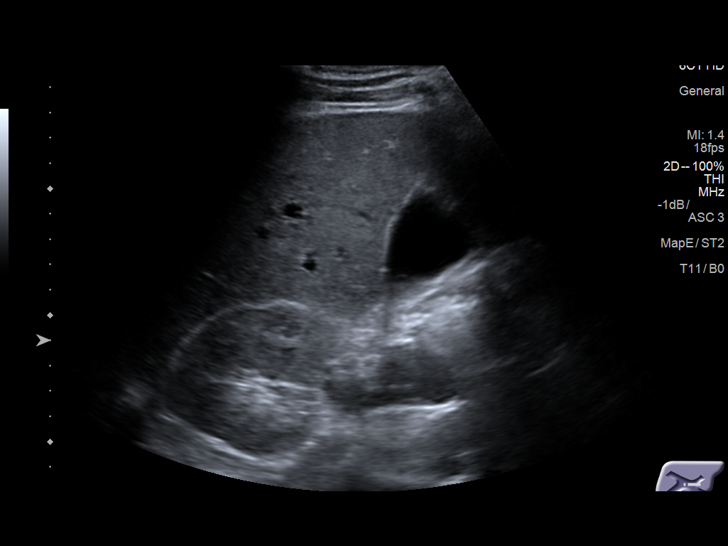
[im 35/53]
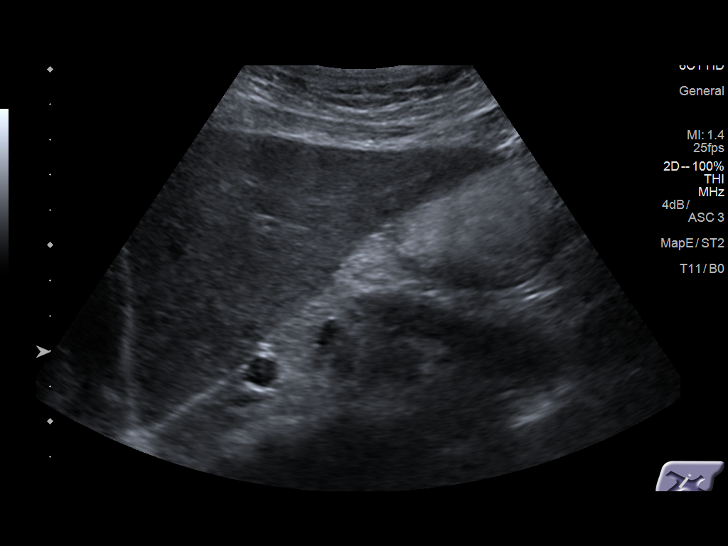
[im 40/53]
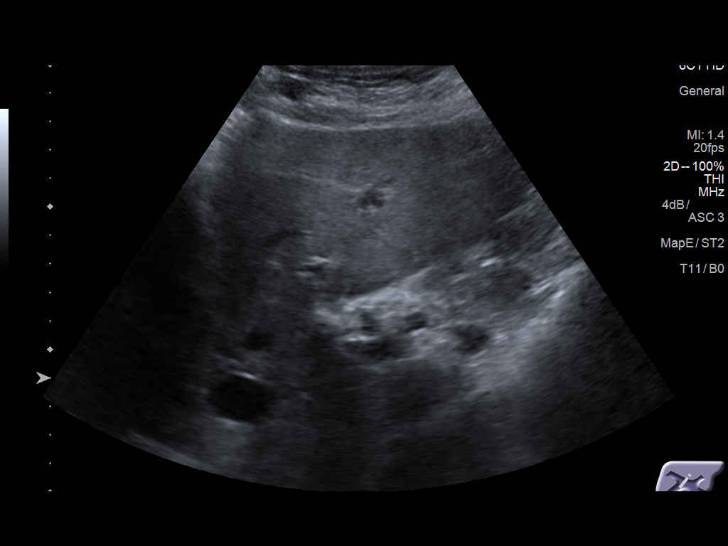
[im 44/53]
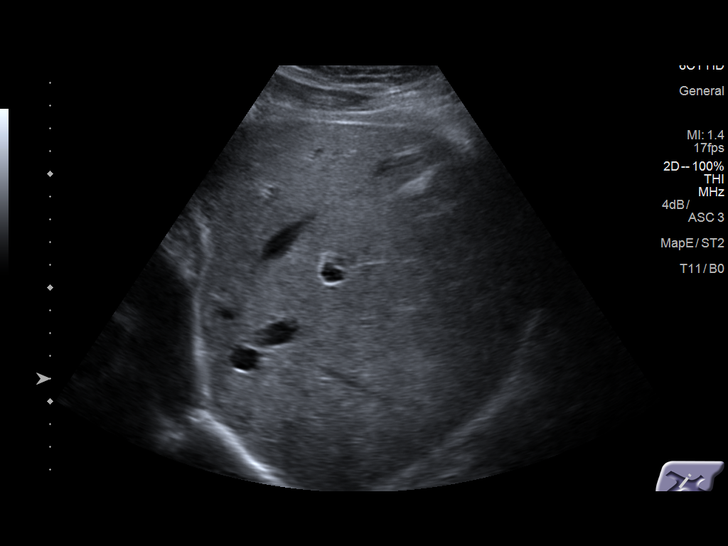
[im 48/53]
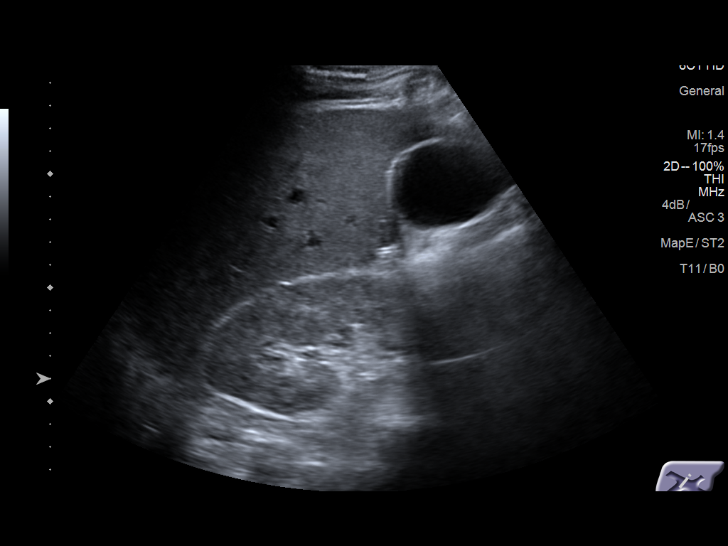
[im 53/53]
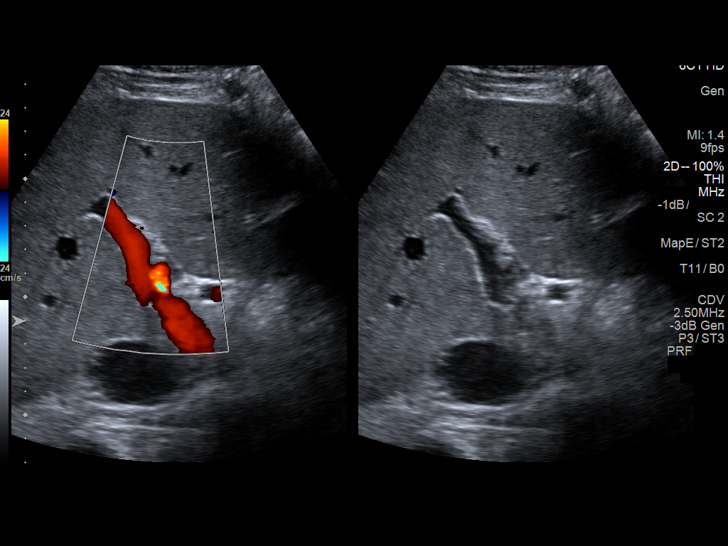

[14 of 25 positions shown; findings below may reference images not displayed]

FINDINGS: Gallbladder:

Small layering stones and sludge in the gallbladder. Gallbladder
wall is upper limits of normal at 3 mm. Murphy's sign is positive.

Common bile duct:

Diameter: 4 mm, normal

Liver:

No focal lesion identified. Within normal limits in parenchymal
echogenicity.
IMPRESSION: Cholelithiasis with small stones and sludge in the gallbladder. Mild
gallbladder wall thickening. Positive Murphy's sign. Findings are
consistent with acute cholecystitis in the appropriate clinical
setting. No bile duct dilatation.

## 2016-12-12 ENCOUNTER — Other Ambulatory Visit: Payer: Self-pay | Admitting: Family Medicine

## 2016-12-20 ENCOUNTER — Other Ambulatory Visit (HOSPITAL_BASED_OUTPATIENT_CLINIC_OR_DEPARTMENT_OTHER): Payer: Self-pay

## 2016-12-20 DIAGNOSIS — G4710 Hypersomnia, unspecified: Secondary | ICD-10-CM

## 2016-12-20 DIAGNOSIS — R5383 Other fatigue: Secondary | ICD-10-CM

## 2016-12-20 DIAGNOSIS — R0683 Snoring: Secondary | ICD-10-CM

## 2016-12-26 ENCOUNTER — Other Ambulatory Visit (HOSPITAL_COMMUNITY): Payer: Self-pay | Admitting: Cardiology

## 2016-12-26 MED ORDER — SILDENAFIL CITRATE 100 MG PO TABS: 50.0000 mg | ORAL_TABLET | Freq: Every day | ORAL | 0 refills | Status: DC | PRN

## 2016-12-28 ENCOUNTER — Other Ambulatory Visit (HOSPITAL_COMMUNITY): Payer: Self-pay | Admitting: *Deleted

## 2017-01-23 ENCOUNTER — Encounter: Payer: Medicaid Other | Admitting: Internal Medicine

## 2017-01-28 ENCOUNTER — Other Ambulatory Visit: Payer: Self-pay | Admitting: Family Medicine

## 2017-01-31 ENCOUNTER — Other Ambulatory Visit (HOSPITAL_COMMUNITY): Payer: Self-pay | Admitting: *Deleted

## 2017-01-31 DIAGNOSIS — I5043 Acute on chronic combined systolic (congestive) and diastolic (congestive) heart failure: Secondary | ICD-10-CM

## 2017-01-31 DIAGNOSIS — I10 Essential (primary) hypertension: Secondary | ICD-10-CM

## 2017-01-31 MED ORDER — SPIRONOLACTONE 25 MG PO TABS: 12.5000 mg | ORAL_TABLET | Freq: Every day | ORAL | 6 refills | Status: DC

## 2017-01-31 MED ORDER — AMLODIPINE BESYLATE 5 MG PO TABS: 5.0000 mg | ORAL_TABLET | Freq: Every day | ORAL | 6 refills | Status: DC

## 2017-01-31 MED ORDER — ISOSORBIDE MONONITRATE ER 60 MG PO TB24: 60.0000 mg | ORAL_TABLET | Freq: Two times a day (BID) | ORAL | 6 refills | Status: DC

## 2017-01-31 MED ORDER — CARVEDILOL 25 MG PO TABS: 25.0000 mg | ORAL_TABLET | Freq: Two times a day (BID) | ORAL | 6 refills | Status: DC

## 2017-01-31 MED ORDER — CLOPIDOGREL BISULFATE 75 MG PO TABS
75.0000 mg | ORAL_TABLET | Freq: Every day | ORAL | 3 refills | Status: DC
Start: 2017-01-31 — End: 2017-07-31

## 2017-01-31 MED ORDER — POTASSIUM CHLORIDE ER 10 MEQ PO TBCR: 10.0000 meq | EXTENDED_RELEASE_TABLET | Freq: Every day | ORAL | 6 refills | Status: DC

## 2017-01-31 MED ORDER — FUROSEMIDE 40 MG PO TABS: 40.0000 mg | ORAL_TABLET | Freq: Two times a day (BID) | ORAL | 6 refills | Status: DC | PRN

## 2017-02-01 ENCOUNTER — Other Ambulatory Visit (HOSPITAL_COMMUNITY): Payer: Self-pay | Admitting: *Deleted

## 2017-02-02 ENCOUNTER — Encounter: Payer: Self-pay | Admitting: Internal Medicine

## 2017-02-02 ENCOUNTER — Ambulatory Visit (HOSPITAL_BASED_OUTPATIENT_CLINIC_OR_DEPARTMENT_OTHER)
Admission: RE | Admit: 2017-02-02 | Discharge: 2017-02-02 | Disposition: A | Discharge status: Home / Self Care | Payer: Medicaid Other | Source: Ambulatory Visit | Attending: Internal Medicine | Admitting: Internal Medicine

## 2017-02-02 ENCOUNTER — Ambulatory Visit (HOSPITAL_BASED_OUTPATIENT_CLINIC_OR_DEPARTMENT_OTHER): Payer: Medicaid Other

## 2017-02-02 ENCOUNTER — Ambulatory Visit (HOSPITAL_COMMUNITY)
Admission: RE | Admit: 2017-02-02 | Discharge: 2017-02-02 | Disposition: A | Discharge status: Home / Self Care | Payer: Medicaid Other | Source: Ambulatory Visit | Attending: Internal Medicine | Admitting: Internal Medicine

## 2017-02-02 VITALS — BP 116/58 | HR 70 | Wt 186.0 lb

## 2017-02-02 DIAGNOSIS — N189 Chronic kidney disease, unspecified: Secondary | ICD-10-CM | POA: Insufficient documentation

## 2017-02-02 DIAGNOSIS — E785 Hyperlipidemia, unspecified: Secondary | ICD-10-CM | POA: Diagnosis not present

## 2017-02-02 DIAGNOSIS — N529 Male erectile dysfunction, unspecified: Secondary | ICD-10-CM | POA: Insufficient documentation

## 2017-02-02 DIAGNOSIS — I472 Ventricular tachycardia: Secondary | ICD-10-CM | POA: Insufficient documentation

## 2017-02-02 DIAGNOSIS — Z79899 Other long term (current) drug therapy: Secondary | ICD-10-CM | POA: Insufficient documentation

## 2017-02-02 DIAGNOSIS — Z85850 Personal history of malignant neoplasm of thyroid: Secondary | ICD-10-CM | POA: Insufficient documentation

## 2017-02-02 DIAGNOSIS — I25700 Atherosclerosis of coronary artery bypass graft(s), unspecified, with unstable angina pectoris: Secondary | ICD-10-CM

## 2017-02-02 DIAGNOSIS — Z9889 Other specified postprocedural states: Secondary | ICD-10-CM | POA: Diagnosis not present

## 2017-02-02 DIAGNOSIS — Z95810 Presence of automatic (implantable) cardiac defibrillator: Secondary | ICD-10-CM | POA: Insufficient documentation

## 2017-02-02 DIAGNOSIS — I130 Hypertensive heart and chronic kidney disease with heart failure and stage 1 through stage 4 chronic kidney disease, or unspecified chronic kidney disease: Secondary | ICD-10-CM | POA: Insufficient documentation

## 2017-02-02 DIAGNOSIS — G4730 Sleep apnea, unspecified: Secondary | ICD-10-CM | POA: Insufficient documentation

## 2017-02-02 DIAGNOSIS — J45909 Unspecified asthma, uncomplicated: Secondary | ICD-10-CM | POA: Diagnosis not present

## 2017-02-02 DIAGNOSIS — I2510 Atherosclerotic heart disease of native coronary artery without angina pectoris: Secondary | ICD-10-CM | POA: Diagnosis not present

## 2017-02-02 DIAGNOSIS — M109 Gout, unspecified: Secondary | ICD-10-CM | POA: Insufficient documentation

## 2017-02-02 DIAGNOSIS — Z888 Allergy status to other drugs, medicaments and biological substances status: Secondary | ICD-10-CM | POA: Insufficient documentation

## 2017-02-02 DIAGNOSIS — I252 Old myocardial infarction: Secondary | ICD-10-CM | POA: Insufficient documentation

## 2017-02-02 DIAGNOSIS — Z882 Allergy status to sulfonamides status: Secondary | ICD-10-CM | POA: Insufficient documentation

## 2017-02-02 DIAGNOSIS — Z7982 Long term (current) use of aspirin: Secondary | ICD-10-CM | POA: Insufficient documentation

## 2017-02-02 DIAGNOSIS — I255 Ischemic cardiomyopathy: Secondary | ICD-10-CM | POA: Diagnosis not present

## 2017-02-02 DIAGNOSIS — R011 Cardiac murmur, unspecified: Secondary | ICD-10-CM | POA: Diagnosis not present

## 2017-02-02 DIAGNOSIS — K219 Gastro-esophageal reflux disease without esophagitis: Secondary | ICD-10-CM | POA: Diagnosis not present

## 2017-02-02 DIAGNOSIS — Z7902 Long term (current) use of antithrombotics/antiplatelets: Secondary | ICD-10-CM | POA: Diagnosis not present

## 2017-02-02 DIAGNOSIS — I5022 Chronic systolic (congestive) heart failure: Secondary | ICD-10-CM

## 2017-02-02 DIAGNOSIS — Z951 Presence of aortocoronary bypass graft: Secondary | ICD-10-CM | POA: Insufficient documentation

## 2017-02-02 DIAGNOSIS — R0600 Dyspnea, unspecified: Secondary | ICD-10-CM

## 2017-02-02 LAB — COMPREHENSIVE METABOLIC PANEL
ALBUMIN: 4 g/dL (ref 3.5–5.0)
ALK PHOS: 53 U/L (ref 38–126)
ALT: 22 U/L (ref 17–63)
AST: 17 U/L (ref 15–41)
Anion gap: 7 (ref 5–15)
BILIRUBIN TOTAL: 0.9 mg/dL (ref 0.3–1.2)
BUN: 21 mg/dL — AB (ref 6–20)
CO2: 28 mmol/L (ref 22–32)
Calcium: 8.8 mg/dL — ABNORMAL LOW (ref 8.9–10.3)
Chloride: 104 mmol/L (ref 101–111)
Creatinine, Ser: 1.53 mg/dL — ABNORMAL HIGH (ref 0.61–1.24)
GFR calc Af Amer: 58 mL/min — ABNORMAL LOW (ref >60)
GFR calc non Af Amer: 50 mL/min — ABNORMAL LOW (ref >60)
GLUCOSE: 98 mg/dL (ref 65–99)
POTASSIUM: 3.9 mmol/L (ref 3.5–5.1)
SODIUM: 139 mmol/L (ref 135–145)
TOTAL PROTEIN: 6.6 g/dL (ref 6.5–8.1)

## 2017-02-02 LAB — ECHOCARDIOGRAM COMPLETE: WEIGHTICAEL: 2976 [oz_av]

## 2017-02-02 LAB — BRAIN NATRIURETIC PEPTIDE: B Natriuretic Peptide: 824.8 pg/mL — ABNORMAL HIGH (ref 0.0–100.0)

## 2017-02-02 MED ORDER — SACUBITRIL-VALSARTAN 24-26 MG PO TABS: 1.0000 | ORAL_TABLET | Freq: Two times a day (BID) | ORAL | 3 refills | Status: DC

## 2017-02-02 NOTE — Progress Notes (Signed)
Echocardiogram 2D Echocardiogram has been performed.  Aggie Cosier 02/02/2017, 11:48 AM

## 2017-02-02 NOTE — Patient Instructions (Signed)
Stop Amlodipine   Start Entresto 24/26 mg twice daily  Labs today  Labs in 1 week  Your physician recommends that you schedule a follow-up appointment in: 3-4 weeks

## 2017-02-03 NOTE — Progress Notes (Signed)
Advanced Heart Failure Clinic Note   Patient ID: Chad [name], male   DOB: 1961-01-12, 56 y.o.   MRN: [id number]   History of Present Illness: Primary Cardiologist:  Dr. Demetrios Loll is a 56 y.o. male with history of severe HTN, coronary artery disease status post previous myocardial infarction and bypass surgery in 2006 with DES to native PDA in 2011.  He also has a history congestive heart failure secondary to ischemic cardiomyopathy   He is s/p single chamber [employer] ICD.  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 He underwent emergent repair of proximal aorta and reimplantation of his CABG grafts.  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 Also with significant low back pain s/p 2 surgeries.   He has frequent CP but have avoided cath due to previous dissection and solitary kidney. Myoview 01/2015 - High risk stress nuclear study with chest pain but no new electrocardiographic changes. The scintigraphic results show a large prior inferior infarct with very mild peri-infarct ischemia. The gated ejection fraction was 27% with global hypokinesis and inferior akinesis. Study felt to be high risk because of reduced RV function  In 11/17 admitted for ICD shocks due to fractured RV lead. Underwent lead extraction and replacement.   Here for unscheduled visit. Since ICD lead change says he has felt worse. More sob. Now having to take lasix 40 bid instead of just PRN. NYHA II-III symptoms. No orthopnea or PND. Still with intermittent CP  Echo done today in clinic EF 20-25% Moderate AI, moderate to severe MR, moderate TR  ICD interrogated personally. No shocks. Rare NSVT. No AF  ECG: NSR with PVCs  10/22/2015: ECHO EF 20-25% 11/26/12 ECHO EF 35-40%  Labs 11/25/13 Cholesterol 186 TG 121 HDL 49  Labs 12/09/13 K 3.6 creatinine 1.3  Labs 3/15 K 4.1, creatinine 1.4, BNP 589, HCT 39.8 Labs 3/16 K 3.8,  creatinine 1.56, HCT 36.5 Labs 12/07/2015: K 3.6 Creatinine 1.37  Labs 05/01/2016: K 4.0 Creatinine 1.34   ROS: All pertinent positives and negatives as in HPI, otherwise negative.    Past Medical History:  Diagnosis Date  . AICD (automatic cardioverter/defibrillator) present   . Anemia   . Anginal pain ([place])   . Aortic dissection, thoracoabdominal ([place])    7/10: Type I s/p repair  . Asthma   . CAD (coronary artery disease)    a. s/p CABG 2006;  b. DES to PDA 2011 (cath: Dx not seen, dRCA/PDA tx with DES; S-PDA occluded (culprit), S-Dx occluded, S-RI and OM ok, L-LAD ok  . Carotid stenosis    dopplers 2011: 0-39% bilat.  . Chest pain syndrome   . CHF (congestive heart failure) ([place])   . Chronic bronchitis ([place])   . Chronic lower back pain   . Chronic systolic heart failure (HCC)    a. 12/13 ECHO: EF 35-40%, sept, apical & posterobasal HK, LV mod dil & sys fx mod reduced, mild AI, MV mild reg, TV mild reg  . Complication of anesthesia    "difficult to wake afterwards a couple of times"  . 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 Dyspnea   . Family history of adverse reaction to anesthesia    "sister hard to wake up"  . GERD (gastroesophageal reflux disease)   . Gout   . Heart murmur   . HLD (hyperlipidemia)   .  HTN (hypertension)    severe  . Myocardial infarction    "many" (11/07/2016)  . PONV (postoperative nausea and vomiting)   . Sleep apnea    "never RX'd mask" (11/07/2016)  . Thyroid cancer ([place])    Hertle Cell    Current Outpatient Prescriptions  Medication Sig Dispense Refill  . aspirin 325 MG tablet Take 325 mg by mouth daily.    . carvedilol (COREG) 25 MG tablet Take 1 tablet (25 mg total) by mouth 2 (two) times daily with a meal. 60 tablet 6  . clopidogrel (PLAVIX) 75 MG tablet Take 1 tablet (75 mg total) by mouth daily. 30 tablet 3  . cyclobenzaprine (FLEXERIL) 10 MG tablet Take 10 mg by mouth daily  as needed for muscle spasms.     . Flaxseed, Linseed, (FLAXSEED OIL PO) Take 2 capsules by mouth 2 (two) times daily.    . furosemide (LASIX) 40 MG tablet Take 1 tablet (40 mg total) by mouth 2 (two) times daily as needed for edema. 60 tablet 6  . hydrALAZINE (APRESOLINE) 50 MG tablet TAKE ONE & ONE-HALF TABLETS BY MOUTH THREE TIMES DAILY 135 tablet 6  . ibuprofen (ADVIL,MOTRIN) 200 MG tablet Take 600 mg by mouth every 6 (six) hours as needed (pain). Pt rarely takes    . Iron-Vitamins (GERITOL COMPLETE PO) Take 2 tablets by mouth daily.    . isosorbide mononitrate (IMDUR) 60 MG 24 hr tablet Take 1 tablet (60 mg total) by mouth 2 (two) times daily. 60 tablet 6  . naphazoline (NAPHCON) 0.1 % ophthalmic solution Place 1 drop into both eyes 4 (four) times daily as needed for irritation.    . nitroGLYCERIN (NITROSTAT) 0.4 MG SL tablet Place 1 tablet (0.4 mg total) under the tongue every 5 (five) minutes x 3 doses as needed for chest pain. 30 tablet 12  . Oxycodone HCl 10 MG TABS Take 20 mg by mouth 3 (three) times daily.     . Potassium 99 MG TABS Take 99 mg by mouth 3 (three) times daily as needed (muscle cramps).    . potassium chloride (K-DUR) 10 MEQ tablet Take 1 tablet (10 mEq total) by mouth daily. 30 tablet 6  . sildenafil (VIAGRA) 100 MG tablet Take 0.5 tablets (50 mg total) by mouth daily as needed for erectile dysfunction. 10 tablet 0  . Spacer/Aero-Holding Chambers (AEROCHAMBER PLUS) inhaler Use as instructed 1 each 0  . spironolactone (ALDACTONE) 25 MG tablet Take 0.5 tablets (12.5 mg total) by mouth daily. 30 tablet 6  . Turmeric (CURCUMIN 95 PO) Take 2 tablets by mouth 2 (two) times daily.     [name] albuterol (PROVENTIL HFA;VENTOLIN HFA) 108 (90 Base) MCG/ACT inhaler Inhale 2 puffs into the lungs every 6 (six) hours as needed for wheezing or shortness of breath. 1 Inhaler 0  . sacubitril-valsartan (ENTRESTO) 24-26 MG Take 1 tablet by mouth 2 (two) times daily. 60 tablet 3   No current  facility-administered medications for this encounter.     Allergies  Allergen Reactions  . Iohexol Anaphylaxis and Other (See Comments)     Desc: PT HAS ANAPHYLAXIS WITH CONTRAST MEDIA!  . Lipitor [Atorvastatin Calcium] Anaphylaxis and Other (See Comments)    Large doses  . Shellfish Allergy Anaphylaxis  . Sulfonamide Derivatives Shortness Of Breath  . Sulfa Antibiotics Swelling  . Latex Rash    With long periods of exposure  . Zocor [Simvastatin] Other (See Comments)    Muscle cramps    Vital Signs:  Vitals:   02/02/17 1017  BP: (!) 116/58  BP Location: Left Arm  Patient Position: Sitting  Cuff Size: Normal  Pulse: 70  SpO2: 96%  Weight: 186 lb (84.4 kg)    Physical Exam: General:  Fatigued appearing. No resp difficulty.  HEENT: normal Neck: supple. JVP 9-10 with prominent CV waves Carotids 2+ bilat; carotid bruits L>R No lymphadenopathy or thryomegaly. Cor: PMI nondisplaced. Regular rate & rhythm. 2/6 TR, 2./6 MR + AI Lungs: CTA Abdomen: soft, nontender, mildly distended. No hepatosplenomegaly. No bruits or masses. Good bowel sounds. Extremities: no cyanosis, clubbing, rash. No edema.  Neuro: alert & orientedx3, cranial nerves grossly intact. moves all 4 extremities w/o difficulty. Affect flat  ASSESSMENT/Plan  1. Chronic systolic HF: Ischemic cardiomyopathy.  Echo (2/16) with EF 25%. S/p [employer] ICD.  - Symptomatically much worse today. EF stable at 25% but now with significant valvular disease with AI, MR, TR. Volume status up. --Continue lasix bid. Take extra as needed. Will start Entresto 24/26 bid. Watch renal function closely.  - On goal carvedilol 25 mg twice a day.   Continue hydralazine 75 mg bid/ Imdur 60 bid.  - Continue spironolactone 12.5 mg daily.  - Will need to follow closely to avoid further decompensation 2. CAD: s/p CABG.  Chronic atypical chest pain, not related to activity. Dr Haroldine Laws reviewed [facility] and did note minor  changes. -Cardiolite 11/2015 with evidence for reversibility and ischemia involving the anterolateral wall and a portion of the lateral wall. -If continues to deteriorate may need to consider repeat cath - Continue Plavix and ASA 81 mg daily.   - Statin intolerance at any dose.  - Continue Zetia. -PCP follows lipids. At next visit refer to Lipid Clinic to consider PCSK-9 3. CKD in setting of solitary kidney s/p dissection -watch renal function closely with diuresis and ARNI  4. HTN: - stable.  5. H/o Type I aortic dissection: Follows with CVTS.  6. Erectile dysfunction -long talk about need to avoid using sildenafil if he has taken Imdur within last 24 hours.  Bensimhon, Daniel,MD 8:52 PM

## 2017-02-09 ENCOUNTER — Ambulatory Visit (INDEPENDENT_AMBULATORY_CARE_PROVIDER_SITE_OTHER): Payer: Medicaid Other | Admitting: Internal Medicine

## 2017-02-09 ENCOUNTER — Encounter: Payer: Self-pay | Admitting: Internal Medicine

## 2017-02-09 ENCOUNTER — Ambulatory Visit (HOSPITAL_COMMUNITY)
Admission: RE | Admit: 2017-02-09 | Discharge: 2017-02-09 | Disposition: A | Discharge status: Home / Self Care | Payer: Medicaid Other | Source: Ambulatory Visit | Attending: Internal Medicine | Admitting: Internal Medicine

## 2017-02-09 DIAGNOSIS — I5022 Chronic systolic (congestive) heart failure: Secondary | ICD-10-CM | POA: Diagnosis not present

## 2017-02-09 DIAGNOSIS — R001 Bradycardia, unspecified: Secondary | ICD-10-CM

## 2017-02-09 LAB — CUP PACEART INCLINIC DEVICE CHECK
Brady Statistic RV Percent Paced: <1
HIGH POWER IMPEDANCE MEASURED VALUE: 34 Ohm
HIGH POWER IMPEDANCE MEASURED VALUE: 57 Ohm
Implantable Lead Serial Number: 333496
Implantable Pulse Generator Implant Date: 20131220
Lead Channel Impedance Value: 518 Ohm
Lead Channel Pacing Threshold Amplitude: 0.6 V
Lead Channel Setting Pacing Amplitude: 3.5 V
Lead Channel Setting Pacing Pulse Width: 0.4 ms
Lead Channel Setting Sensing Sensitivity: 0.5 mV
MDC IDC LEAD IMPLANT DT: 20171114
MDC IDC LEAD LOCATION: 753860
MDC IDC MSMT LEADCHNL RV PACING THRESHOLD PULSEWIDTH: 0.4 ms
MDC IDC MSMT LEADCHNL RV SENSING INTR AMPL: 19.9 mV
MDC IDC SESS DTM: 20180216050000
Pulse Gen Serial Number: 124654

## 2017-02-09 LAB — BASIC METABOLIC PANEL
Anion gap: 7 (ref 5–15)
BUN: 25 mg/dL — ABNORMAL HIGH (ref 6–20)
CHLORIDE: 105 mmol/L (ref 101–111)
CO2: 26 mmol/L (ref 22–32)
CREATININE: 1.68 mg/dL — AB (ref 0.61–1.24)
Calcium: 8.7 mg/dL — ABNORMAL LOW (ref 8.9–10.3)
GFR calc non Af Amer: 45 mL/min — ABNORMAL LOW (ref >60)
GFR, EST AFRICAN AMERICAN: 52 mL/min — AB (ref >60)
GLUCOSE: 100 mg/dL — AB (ref 65–99)
Potassium: 4.1 mmol/L (ref 3.5–5.1)
Sodium: 138 mmol/L (ref 135–145)

## 2017-02-09 LAB — BRAIN NATRIURETIC PEPTIDE: B Natriuretic Peptide: 413.8 pg/mL — ABNORMAL HIGH (ref 0.0–100.0)

## 2017-02-09 MED ORDER — HYDRALAZINE HCL 50 MG PO TABS: ORAL_TABLET | ORAL | 3 refills | Status: DC

## 2017-02-09 NOTE — Progress Notes (Signed)
HPI Mr. Student returns today for followup. He is a 56 year old man with an ischemic cardiomyopathy, status post bypass surgery, with chronic systolic heart failure, ejection fraction 25%. The patient is status post ICD implantation.  He had several ICD shocks last week and was found to have a fractured rate/sense portion of his lead. His device has been turned off. He has never had an ICD shock prior to this. He has an EF of 20% by echo less than a year ago. He has had longstanding LV dysfunction since his remote MI. His heart failure symptoms are class 2A. He underwent ICD lead extraction, insertion of a new ICD system and relocation to a subpectoral location several months ago. He has done well in the interim. He was initially very sore but has improved. Allergies  Allergen Reactions  . Iohexol Anaphylaxis and Other (See Comments)     Desc: PT HAS ANAPHYLAXIS WITH CONTRAST MEDIA!  . Lipitor [Atorvastatin Calcium] Anaphylaxis and Other (See Comments)    Large doses  . Shellfish Allergy Anaphylaxis  . Sulfonamide Derivatives Shortness Of Breath  . Sulfa Antibiotics Swelling  . Latex Rash    With long periods of exposure  . Zocor [Simvastatin] Other (See Comments)    Muscle cramps     Current Outpatient Prescriptions  Medication Sig Dispense Refill  . albuterol (PROVENTIL HFA;VENTOLIN HFA) 108 (90 Base) MCG/ACT inhaler Inhale 2 puffs into the lungs every 6 (six) hours as needed for wheezing or shortness of breath. 1 Inhaler 0  . aspirin 325 MG tablet Take 325 mg by mouth daily.    . carvedilol (COREG) 25 MG tablet Take 1 tablet (25 mg total) by mouth 2 (two) times daily with a meal. 60 tablet 6  . clopidogrel (PLAVIX) 75 MG tablet Take 1 tablet (75 mg total) by mouth daily. 30 tablet 3  . cyclobenzaprine (FLEXERIL) 10 MG tablet Take 10 mg by mouth daily as needed for muscle spasms.     . Flaxseed, Linseed, (FLAXSEED OIL PO) Take 2 capsules by mouth 2 (two) times daily.    . furosemide  (LASIX) 40 MG tablet Take 1 tablet (40 mg total) by mouth 2 (two) times daily as needed for edema. 60 tablet 6  . hydrALAZINE (APRESOLINE) 50 MG tablet TAKE ONE & ONE-HALF TABLETS BY MOUTH THREE TIMES DAILY 405 tablet 3  . ibuprofen (ADVIL,MOTRIN) 200 MG tablet Take 600 mg by mouth every 6 (six) hours as needed (pain). Pt rarely takes    . Iron-Vitamins (GERITOL COMPLETE PO) Take 2 tablets by mouth daily.    . isosorbide mononitrate (IMDUR) 60 MG 24 hr tablet Take 1 tablet (60 mg total) by mouth 2 (two) times daily. 60 tablet 6  . naphazoline (NAPHCON) 0.1 % ophthalmic solution Place 1 drop into both eyes 4 (four) times daily as needed for irritation.    . nitroGLYCERIN (NITROSTAT) 0.4 MG SL tablet Place 1 tablet (0.4 mg total) under the tongue every 5 (five) minutes x 3 doses as needed for chest pain. 30 tablet 12  . Oxycodone HCl 10 MG TABS Take 20 mg by mouth 3 (three) times daily.     . Potassium 99 MG TABS Take 99 mg by mouth 3 (three) times daily as needed (muscle cramps).    . potassium chloride (K-DUR) 10 MEQ tablet Take 1 tablet (10 mEq total) by mouth daily. 30 tablet 6  . sacubitril-valsartan (ENTRESTO) 24-26 MG Take 1 tablet by mouth 2 (two) times daily. 60 tablet 3  .  sildenafil (VIAGRA) 100 MG tablet Take 0.5 tablets (50 mg total) by mouth daily as needed for erectile dysfunction. 10 tablet 0  . Spacer/Aero-Holding Chambers (AEROCHAMBER PLUS) inhaler Use as instructed 1 each 0  . spironolactone (ALDACTONE) 25 MG tablet Take 0.5 tablets (12.5 mg total) by mouth daily. 30 tablet 6  . Turmeric (CURCUMIN 95 PO) Take 2 tablets by mouth 2 (two) times daily.      No current facility-administered medications for this visit.      Past Medical History:  Diagnosis Date  . AICD (automatic cardioverter/defibrillator) present   . Anemia   . Anginal pain ([place])   . Aortic dissection, thoracoabdominal ([place])    7/10: Type I s/p repair  . Asthma   . CAD (coronary artery disease)    a. s/p  CABG 2006;  b. DES to PDA 2011 (cath: Dx not seen, dRCA/PDA tx with DES; S-PDA occluded (culprit), S-Dx occluded, S-RI and OM ok, L-LAD ok  . Carotid stenosis    dopplers 2011: 0-39% bilat.  . Chest pain syndrome   . CHF (congestive heart failure) ([place])   . Chronic bronchitis ([place])   . Chronic lower back pain   . Chronic systolic heart failure (HCC)    a. 12/13 ECHO: EF 35-40%, sept, apical & posterobasal HK, LV mod dil & sys fx mod reduced, mild AI, MV mild reg, TV mild reg  . Complication of anesthesia    "difficult to wake afterwards a couple of times"  . 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 Dyspnea   . Family history of adverse reaction to anesthesia    "sister hard to wake up"  . GERD (gastroesophageal reflux disease)   . Gout   . Heart murmur   . HLD (hyperlipidemia)   . HTN (hypertension)    severe  . Myocardial infarction    "many" (11/07/2016)  . PONV (postoperative nausea and vomiting)   . Sleep apnea    "never RX'd mask" (11/07/2016)  . Thyroid cancer ([place])    Hertle Cell    ROS:   All systems reviewed and negative except as noted in the HPI.   Past Surgical History:  Procedure Laterality Date  . BACK SURGERY    . CARDIAC CATHETERIZATION     "several" (11/07/2016)  . CARDIAC DEFIBRILLATOR PLACEMENT  11/2005   [employer]; Archie Endo 05/09/2011  . CORONARY ANGIOPLASTY WITH STENT PLACEMENT     "I've had 1-2 stents" (11/07/2016)  . CORONARY ARTERY BYPASS GRAFT  06/21/2009   "CABG X2"  . CORONARY ARTERY BYPASS GRAFT  06/29/2005   "CABG X7"  . ICD LEAD REMOVAL  11/07/2016  . ICD LEAD REMOVAL N/A 11/07/2016   Procedure: ICD LEAD REMOVAL, INSERTION OF NEW ICD LEAD;  Surgeon: Evans Lance, MD;  Location: [facility];  Service: Cardiovascular;  Laterality: N/A;  Dr. Prescott Gum to backup case  . IMPLANTABLE CARDIOVERTER DEFIBRILLATOR (ICD) GENERATOR CHANGE N/A 12/13/2012   Procedure: ICD GENERATOR CHANGE;   Surgeon: Evans Lance, MD;  Location: [facility] CATH LAB;  Service: Cardiovascular;  Laterality: N/A;  . LUMBAR [place] SURGERY  01/2001    most recent within 5-10 years  . SHOULDER ARTHROSCOPY WITH ROTATOR CUFF REPAIR Bilateral   . Status post emergency repair of a type A ascending aortic dissection with a hemiarch reconstruction of the ascending aorta  using a 28-mm Hemashield graft with redo sternotomy and revision of previous bypass  grafts in June 2010.    . TESTICLE SURGERY    . THYROIDECTOMY, PARTIAL  06/20/2011  . VASECTOMY       Family History  Problem Relation Age of Onset  . Hypertension Father   . Heart disease Father   . Early death Father   . COPD Father   . Hypertension Mother   . Coronary artery disease       Social History   Social History  . Marital status: Divorced    Spouse name: N/A  . Number of children: N/A  . Years of education: N/A   Occupational History  . disabled    Social History Main Topics  . Smoking status: Never Smoker  . Smokeless tobacco: Never Used  . Alcohol use No  . Drug use: No  . Sexual activity: Yes   Other Topics Concern  . Not on file   Social History Narrative   Engaged and lives with fiancee and 4 kids.    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      BP 118/72   Pulse 80   Ht 5' 11.5" (1.816 m)   Wt 176 lb (79.8 kg)   BMI 24.20 kg/m   Physical Exam:  Chronically ill appearing middle-aged man, NAD HEENT: Unremarkable Neck:  7 cm JVD, no thyromegally Lungs:  Clear except for rales in the bases bilaterally. No wheezes or rhonchi. No increased work of breathing. Well healed ICD incision. HEART:  Regular rate rhythm, an intermittant MR murmur appears present with premature beats, no rubs, no clicks, Abd:  soft, positive bowel sounds, no organomegally, no rebound, no guarding Ext:  2 plus pulses, no edema, no cyanosis, no clubbing Skin:  No rashes no nodules Neuro:  CN II through XII intact, motor grossly  intact  DEVICE  Normal device function.  See PaceArt for details.  Assess/Plan:  1. Broken ICD lead - his [employer] device has undergone revision and is working normally and the incision has healed nicely.  2. ICM - he has no anginal symptoms. Will follow. 3. Chronic systolic heart failure - his symptoms are class 2. He will continue his current meds. 4. HTN - his blood pressure is well controlled on medical therapy.  Mikle Bosworth.D.

## 2017-02-09 NOTE — Patient Instructions (Addendum)
Medication Instructions:  Your physician recommends that you continue on your current medications as directed. Please refer to the Current Medication list given to you today.   Labwork: None Ordered    Testing/Procedures: None Ordered   Follow-Up: Your physician wants you to follow-up in: 9 months with Dr. Lovena Le. You will receive a reminder letter in the mail two months in advance. If you don't receive a letter, please call our office to schedule the follow-up appointment.  Remote monitoring is used to monitor your ICD from home. This monitoring reduces the number of office visits required to check your device to one time per year. It allows [place] to keep an eye on the functioning of your device to ensure it is working properly. You are scheduled for a device check from home on 05/14/17. You may send your transmission at any time that day. If you have a wireless device, the transmission will be sent automatically. After your physician reviews your transmission, you will receive a postcard with your next transmission date.    Any Other Special Instructions Will Be Listed Below (If Applicable).     If you need a refill on your cardiac medications before your next appointment, please call your pharmacy.

## 2017-02-12 ENCOUNTER — Telehealth (HOSPITAL_COMMUNITY): Payer: Self-pay | Admitting: Pharmacist

## 2017-02-12 NOTE — Telephone Encounter (Signed)
Entresto PA approved by [place] Medicaid through 02/12/18.   [name]. [name], PharmD, BCPS, CPP Clinical Pharmacist Pager: [phone] Phone: [phone] 02/12/2017 11:50 AM

## 2017-02-13 ENCOUNTER — Telehealth (HOSPITAL_COMMUNITY): Payer: Self-pay | Admitting: *Deleted

## 2017-02-13 DIAGNOSIS — I5022 Chronic systolic (congestive) heart failure: Secondary | ICD-10-CM

## 2017-02-13 NOTE — Telephone Encounter (Signed)
-----   Message from Jolaine Artist, MD sent at 02/10/2017  8:57 PM EST ----- BNP coming down. Creatinine up slightly. Recheck in 2 weeks.

## 2017-02-13 NOTE — Telephone Encounter (Signed)
Notes Recorded by Scarlette Calico, RN on 02/13/2017 at 11:13 AM EST Pt aware, repeat lab sch 3/5 ------  Notes Recorded by Jolaine Artist, MD on 02/10/2017 at 8:57 PM EST BNP coming down. Creatinine up slightly. Recheck in 2 weeks.

## 2017-02-21 IMAGING — CR DG CHEST 2V
2 series · 2 of 2 positions shown · non-contrast
Comparison: Multiple exams, including 12/05/2015

CLINICAL DATA: Pt c/o defib firing X1 day ago with onset of
fatigue; fired once again today; hx HTN, CAD, COPD, non-smoker

EXAM:
CHEST  2 VIEW

[chest pa]
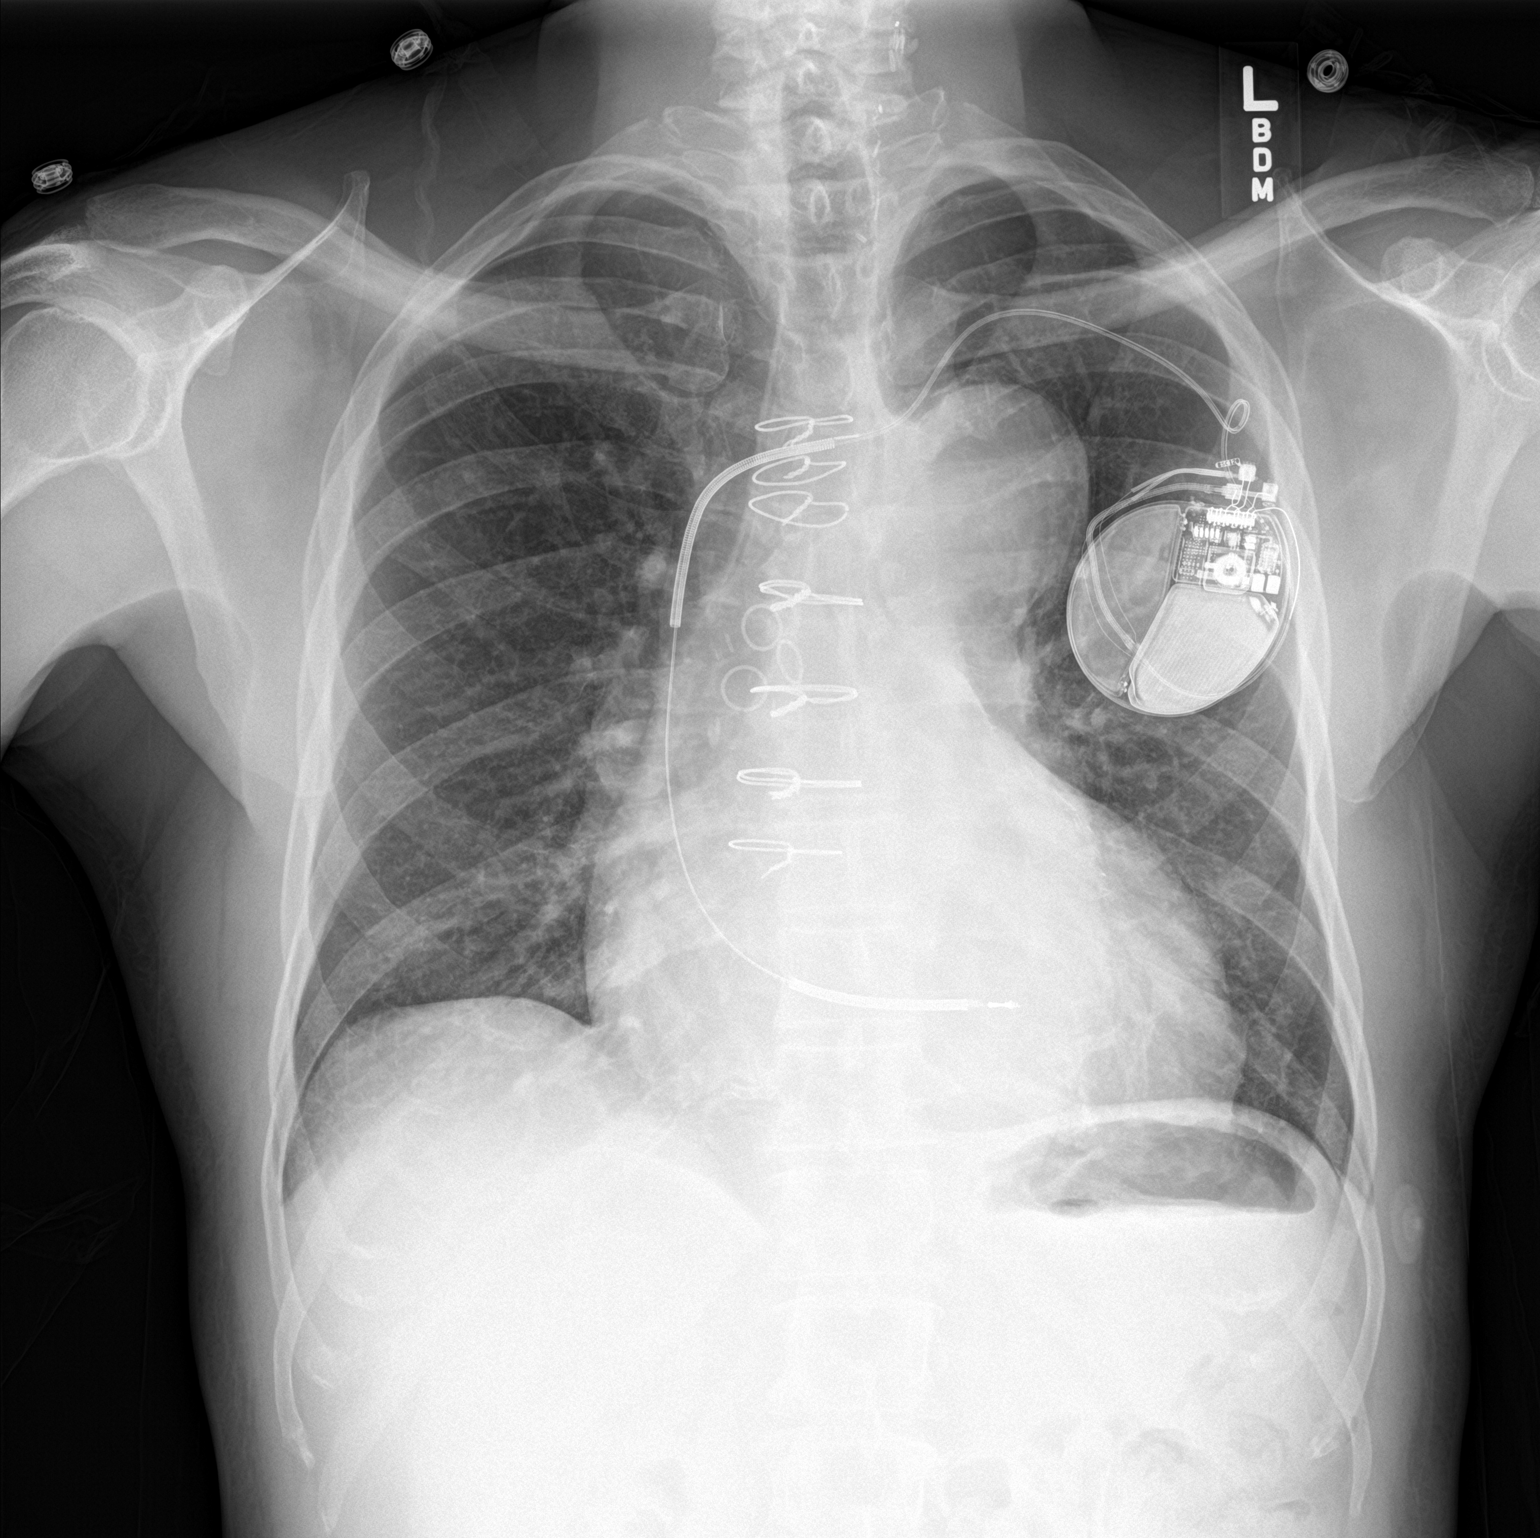

[chest lat]
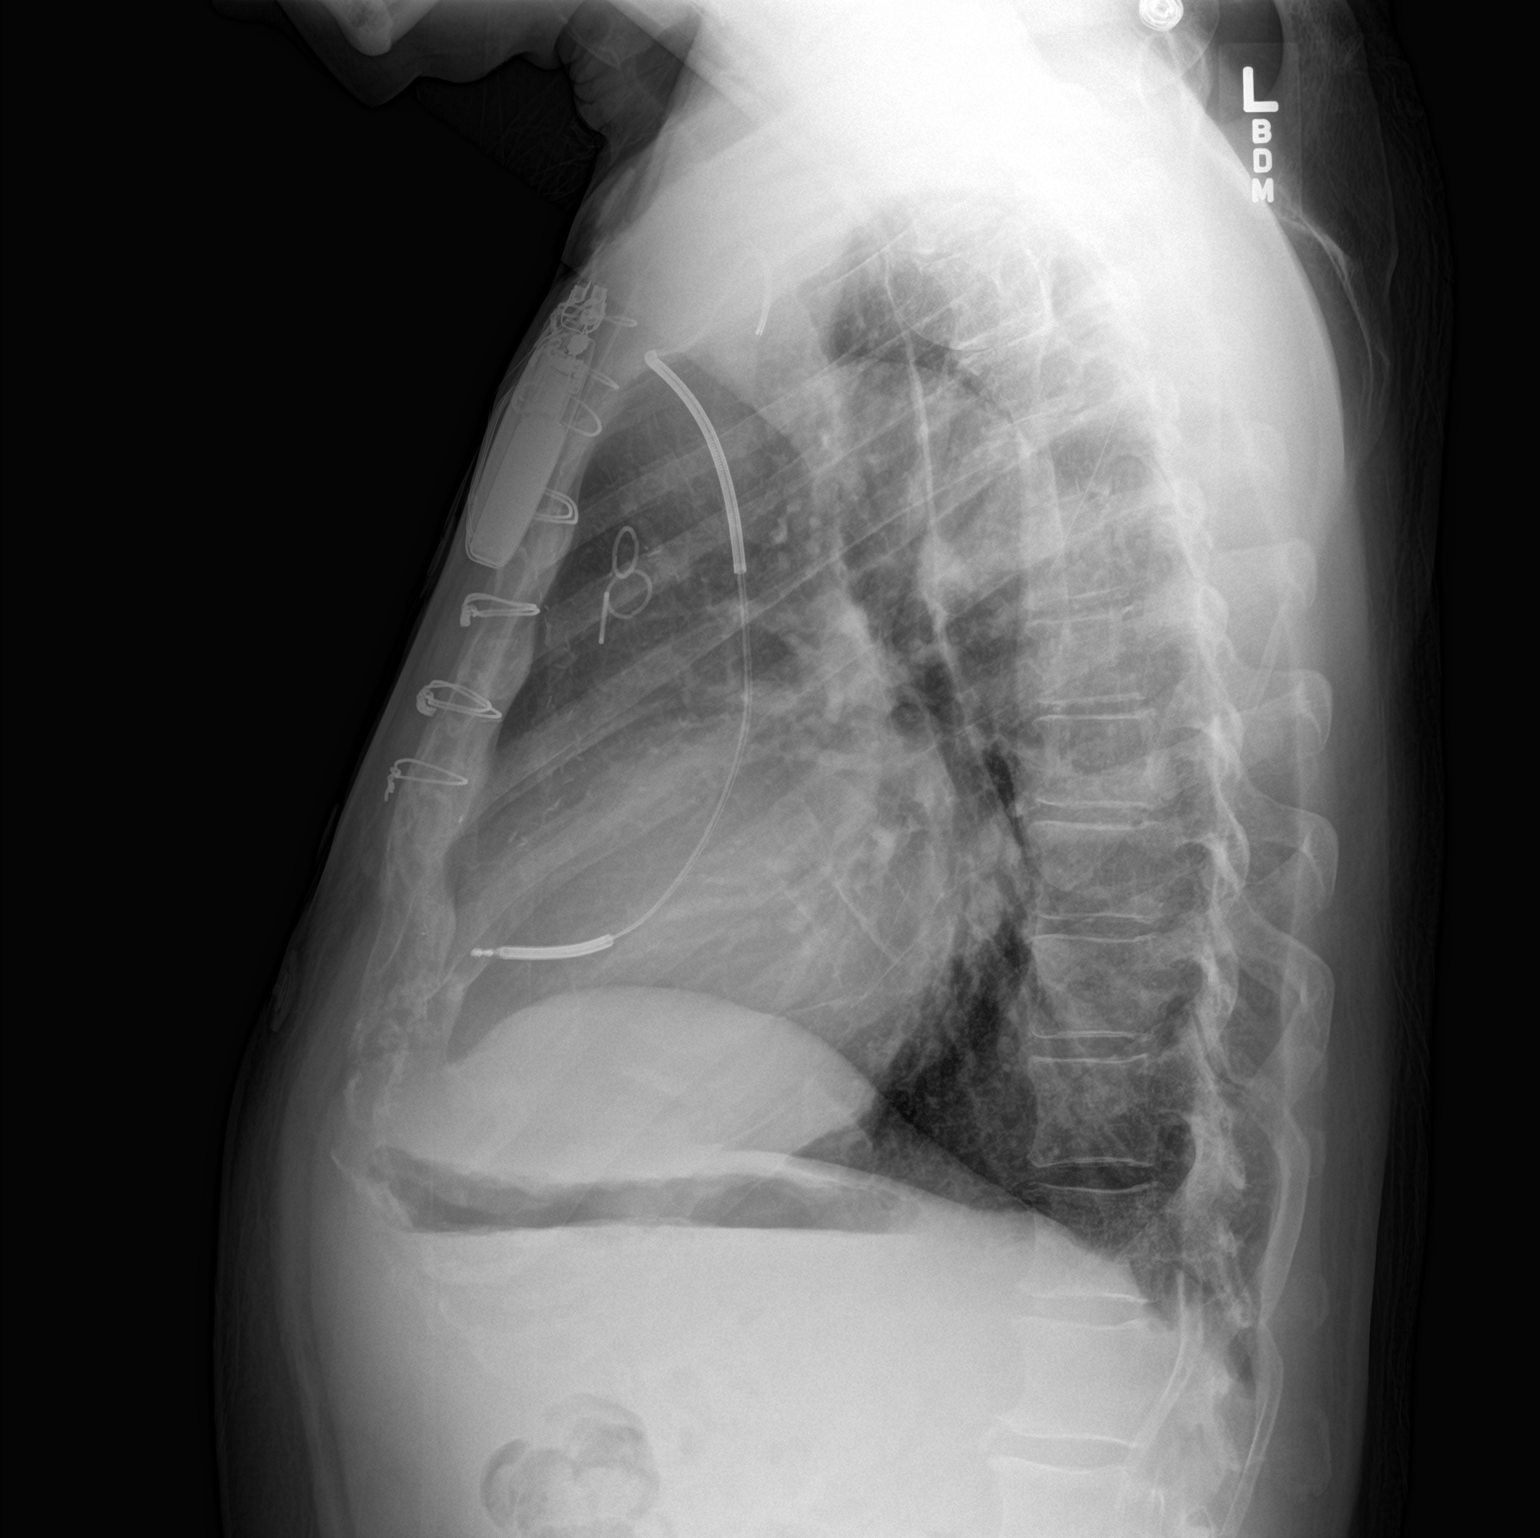

[2 of 2 positions shown; findings below may reference images not displayed]

FINDINGS: The patient has a known ascending aortic graft. A defibrillator
positioning stable from 12/04/2015 without a lead discontinuity
identified. Prior CABG. Ectatic or aneurysmal aortic arch. Moderate
cardiomegaly. No edema or pleural effusion. Clips along the left
neck.
IMPRESSION: 1. Stable moderate enlargement of the cardiopericardial silhouette,
without edema.
2. Stable dilated aortic arch. The patient has a known graft of the
ascending aorta.
3. No radiographic abnormality of the defibrillator is identified,
its distal tip projects over the expected location of the right
ventricle.

## 2017-02-26 ENCOUNTER — Inpatient Hospital Stay (HOSPITAL_COMMUNITY): Admission: RE | Admit: 2017-02-26 | Payer: Medicaid Other | Source: Ambulatory Visit

## 2017-02-28 ENCOUNTER — Telehealth (HOSPITAL_COMMUNITY): Payer: Self-pay | Admitting: *Deleted

## 2017-02-28 NOTE — Telephone Encounter (Signed)
Patient's wife called saying that patient started having increased labored breathing with activity since Monday. No cough or swelling.  Patient has been taking medications as prescribed but has not been checking daily weights.  Patient's wife said he has been eating out more than usual lately so she thinks his salt intake has increased.  I educated on daily weights and the importance, she said she will have him start recording them.  Dr. Haroldine Laws said he may take an extra lasix in his last office note so I advised her to have him take an additional 40 mg Lasix now and if he doesn't show any improvement to call me back tomorrow.  She is agreeable with plan with no further questions.

## 2017-03-02 ENCOUNTER — Telehealth (HOSPITAL_COMMUNITY): Payer: Self-pay | Admitting: *Deleted

## 2017-03-02 MED ORDER — SACUBITRIL-VALSARTAN 24-26 MG PO TABS: 2.0000 | ORAL_TABLET | Freq: Two times a day (BID) | ORAL | 3 refills | Status: DC

## 2017-03-02 NOTE — Telephone Encounter (Signed)
Pt called c/o increased BP, he states he has not been able to get it under control.  He states he checks BP 3-4 times a day and lately it has been running 140s/90s which he states makes him cough, so he has been taking his Carvedilol, Lasix, Hydralazine and Isosorbide about 4 times most days to help but it has not been working.  He is also on Entresto 24/26 mg and has only been taking it BID.  Discussed w/Dr Aundra Dubin, he recommends pt increase Entresto to 49/51 mg BID and f/u on Mon.  Pt aware and agreeable, appt sch for Mon 3/12 w/Dr Bensimhon.

## 2017-03-05 ENCOUNTER — Encounter (HOSPITAL_COMMUNITY): Payer: Medicaid Other | Admitting: Internal Medicine

## 2017-03-05 ENCOUNTER — Other Ambulatory Visit (HOSPITAL_COMMUNITY): Payer: Self-pay | Admitting: *Deleted

## 2017-03-05 MED ORDER — SACUBITRIL-VALSARTAN 49-51 MG PO TABS: 1.0000 | ORAL_TABLET | Freq: Two times a day (BID) | ORAL | 3 refills | Status: DC

## 2017-03-13 ENCOUNTER — Ambulatory Visit (HOSPITAL_BASED_OUTPATIENT_CLINIC_OR_DEPARTMENT_OTHER): Payer: Medicaid Other | Attending: Nurse Practitioner | Admitting: Internal Medicine

## 2017-03-13 VITALS — Ht 71.0 in | Wt 180.0 lb

## 2017-03-13 DIAGNOSIS — Z950 Presence of cardiac pacemaker: Secondary | ICD-10-CM | POA: Insufficient documentation

## 2017-03-13 DIAGNOSIS — G4733 Obstructive sleep apnea (adult) (pediatric): Secondary | ICD-10-CM | POA: Diagnosis present

## 2017-03-13 DIAGNOSIS — Z7982 Long term (current) use of aspirin: Secondary | ICD-10-CM | POA: Diagnosis not present

## 2017-03-13 DIAGNOSIS — I493 Ventricular premature depolarization: Secondary | ICD-10-CM | POA: Diagnosis not present

## 2017-03-13 DIAGNOSIS — R5383 Other fatigue: Secondary | ICD-10-CM

## 2017-03-13 DIAGNOSIS — R0683 Snoring: Secondary | ICD-10-CM

## 2017-03-13 DIAGNOSIS — G4710 Hypersomnia, unspecified: Secondary | ICD-10-CM

## 2017-03-14 ENCOUNTER — Ambulatory Visit (HOSPITAL_COMMUNITY)
Admission: RE | Admit: 2017-03-14 | Discharge: 2017-03-14 | Disposition: A | Discharge status: Home / Self Care | Payer: Medicaid Other | Source: Ambulatory Visit | Attending: Internal Medicine | Admitting: Internal Medicine

## 2017-03-14 VITALS — BP 122/74 | HR 74 | Wt 183.2 lb

## 2017-03-14 DIAGNOSIS — I7101 Dissection of thoracic aorta: Secondary | ICD-10-CM

## 2017-03-14 DIAGNOSIS — N182 Chronic kidney disease, stage 2 (mild): Secondary | ICD-10-CM

## 2017-03-14 DIAGNOSIS — I71019 Dissection of thoracic aorta, unspecified: Secondary | ICD-10-CM

## 2017-03-14 DIAGNOSIS — I5022 Chronic systolic (congestive) heart failure: Secondary | ICD-10-CM | POA: Diagnosis not present

## 2017-03-14 DIAGNOSIS — I2510 Atherosclerotic heart disease of native coronary artery without angina pectoris: Secondary | ICD-10-CM

## 2017-03-14 LAB — BASIC METABOLIC PANEL
Anion gap: 6 (ref 5–15)
BUN: 14 mg/dL (ref 6–20)
CALCIUM: 8.9 mg/dL (ref 8.9–10.3)
CO2: 29 mmol/L (ref 22–32)
Chloride: 105 mmol/L (ref 101–111)
Creatinine, Ser: 1.36 mg/dL — ABNORMAL HIGH (ref 0.61–1.24)
GFR calc non Af Amer: 58 mL/min — ABNORMAL LOW (ref >60)
Glucose, Bld: 81 mg/dL (ref 65–99)
Potassium: 4.4 mmol/L (ref 3.5–5.1)
SODIUM: 140 mmol/L (ref 135–145)

## 2017-03-14 MED ORDER — SACUBITRIL-VALSARTAN 97-103 MG PO TABS: 1.0000 | ORAL_TABLET | Freq: Two times a day (BID) | ORAL | 3 refills | Status: DC

## 2017-03-14 NOTE — Patient Instructions (Signed)
INCREASE Entresto to 97/103 twice daily.  Routine lab work today. Will notify you of abnormal results  Your provider requests you have a Cardiopulmonary Exercise Test. (CPX)   __________________________________   Follow up with Dr.Bensimhon in 6 weeks

## 2017-03-14 NOTE — Progress Notes (Signed)
Advanced Heart Failure Clinic Note   Patient ID: Chad [name], male   DOB: 1961-08-27, 56 y.o.   MRN: [id number]   History of Present Illness: Primary Cardiologist:  Dr. Demetrios Loll is a 56 y.o. male with history of severe HTN, coronary artery disease status post previous myocardial infarction and bypass surgery in 2006 with DES to native PDA in 2011.  He also has a history congestive heart failure secondary to ischemic cardiomyopathy   He is s/p single chamber [employer] ICD.  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 He underwent emergent repair of proximal aorta and reimplantation of his CABG grafts.  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 Also with significant low back pain s/p 2 surgeries.   He has frequent CP but have avoided cath due to previous dissection and solitary kidney. Myoview 01/2015 - High risk stress nuclear study with chest pain but no new electrocardiographic changes. The scintigraphic results show a large prior inferior infarct with very mild peri-infarct ischemia. The gated ejection fraction was 27% with global hypokinesis and inferior akinesis. Study felt to be high risk because of reduced RV function  In 11/17 admitted for ICD shocks due to fractured RV lead. Underwent lead extraction and replacement.   We saw him in 2/18 for acute visit due to increasing dyspnea and volume overload. ECHO EF 20-25% moderate RV dysfunction. Moderate AI and severe MR. Ao Root 45mm-> 28mm. Entresto added and has been titrated. Feels a lot better. Can do almost anything he wants. Weight down about 10 pounds. No othopnea or PND. BP well controlled   ICD interrogated personally. No shocks. Rare NSVT. No AF Volume looks ok. Activity level 4 hours per day  ECG: NSR with PVCs 3/18: ECHO EF 20-25% Moderate AI, moderate to severe MR, moderate TR. Ao Root 50cm 10/22/2015: ECHO EF 20-25% 11/26/12 ECHO  EF 35-40%  Labs 11/25/13 Cholesterol 186 TG 121 HDL 49  Labs 12/09/13 K 3.6 creatinine 1.3  Labs 3/15 K 4.1, creatinine 1.4, BNP 589, HCT 39.8 Labs 3/16 K 3.8, creatinine 1.56, HCT 36.5 Labs 12/07/2015: K 3.6 Creatinine 1.37  Labs 05/01/2016: K 4.0 Creatinine 1.34   ROS: All pertinent positives and negatives as in HPI, otherwise negative.    Past Medical History:  Diagnosis Date  . AICD (automatic cardioverter/defibrillator) present   . Anemia   . Anginal pain ([place])   . Aortic dissection, thoracoabdominal ([place])    7/10: Type I s/p repair  . Asthma   . CAD (coronary artery disease)    a. s/p CABG 2006;  b. DES to PDA 2011 (cath: Dx not seen, dRCA/PDA tx with DES; S-PDA occluded (culprit), S-Dx occluded, S-RI and OM ok, L-LAD ok  . Carotid stenosis    dopplers 2011: 0-39% bilat.  . Chest pain syndrome   . CHF (congestive heart failure) ([place])   . Chronic bronchitis ([place])   . Chronic lower back pain   . Chronic systolic heart failure (HCC)    a. 12/13 ECHO: EF 35-40%, sept, apical & posterobasal HK, LV mod dil & sys fx mod reduced, mild AI, MV mild reg, TV mild reg  . Complication of anesthesia    "difficult to wake afterwards a couple of times"  . 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 Dyspnea   . Family history of adverse reaction to  anesthesia    "sister hard to wake up"  . GERD (gastroesophageal reflux disease)   . Gout   . Heart murmur   . HLD (hyperlipidemia)   . HTN (hypertension)    severe  . Myocardial infarction    "many" (11/07/2016)  . PONV (postoperative nausea and vomiting)   . Sleep apnea    "never RX'd mask" (11/07/2016)  . Thyroid cancer ([place])    Hertle Cell    Current Outpatient Prescriptions  Medication Sig Dispense Refill  . albuterol (PROVENTIL HFA;VENTOLIN HFA) 108 (90 Base) MCG/ACT inhaler Inhale 2 puffs into the lungs every 6 (six) hours as needed for wheezing or shortness of breath. 1  Inhaler 0  . aspirin 325 MG tablet Take 325 mg by mouth daily.    . carvedilol (COREG) 25 MG tablet Take 1 tablet (25 mg total) by mouth 2 (two) times daily with a meal. 60 tablet 6  . clopidogrel (PLAVIX) 75 MG tablet Take 1 tablet (75 mg total) by mouth daily. 30 tablet 3  . cyclobenzaprine (FLEXERIL) 10 MG tablet Take 10 mg by mouth daily as needed for muscle spasms.     . Flaxseed, Linseed, (FLAXSEED OIL PO) Take 2 capsules by mouth 2 (two) times daily.    . furosemide (LASIX) 40 MG tablet Take 1 tablet (40 mg total) by mouth 2 (two) times daily as needed for edema. 60 tablet 6  . hydrALAZINE (APRESOLINE) 50 MG tablet TAKE ONE & ONE-HALF TABLETS BY MOUTH THREE TIMES DAILY 405 tablet 3  . ibuprofen (ADVIL,MOTRIN) 200 MG tablet Take 600 mg by mouth every 6 (six) hours as needed (pain). Pt rarely takes    . Iron-Vitamins (GERITOL COMPLETE PO) Take 2 tablets by mouth daily.    . isosorbide mononitrate (IMDUR) 60 MG 24 hr tablet Take 1 tablet (60 mg total) by mouth 2 (two) times daily. 60 tablet 6  . naphazoline (NAPHCON) 0.1 % ophthalmic solution Place 1 drop into both eyes 4 (four) times daily as needed for irritation.    . nitroGLYCERIN (NITROSTAT) 0.4 MG SL tablet Place 1 tablet (0.4 mg total) under the tongue every 5 (five) minutes x 3 doses as needed for chest pain. 30 tablet 12  . Oxycodone HCl 10 MG TABS Take 20 mg by mouth 3 (three) times daily.     . potassium chloride (K-DUR) 10 MEQ tablet Take 1 tablet (10 mEq total) by mouth daily. 30 tablet 6  . sacubitril-valsartan (ENTRESTO) 49-51 MG Take 2 tablets by mouth 2 (two) times daily.    . sildenafil (VIAGRA) 100 MG tablet Take 0.5 tablets (50 mg total) by mouth daily as needed for erectile dysfunction. 10 tablet 0  . Spacer/Aero-Holding Chambers (AEROCHAMBER PLUS) inhaler Use as instructed 1 each 0  . spironolactone (ALDACTONE) 25 MG tablet Take 0.5 tablets (12.5 mg total) by mouth daily. 30 tablet 6  . Turmeric (CURCUMIN 95 PO) Take 2  tablets by mouth 2 (two) times daily.      No current facility-administered medications for this encounter.     Allergies  Allergen Reactions  . Iohexol Anaphylaxis and Other (See Comments)     Desc: PT HAS ANAPHYLAXIS WITH CONTRAST MEDIA!  . Lipitor [Atorvastatin Calcium] Anaphylaxis and Other (See Comments)    Large doses  . Shellfish Allergy Anaphylaxis  . Sulfonamide Derivatives Shortness Of Breath  . Sulfa Antibiotics Swelling  . Latex Rash    With long periods of exposure  . Zocor [Simvastatin] Other (See Comments)  Muscle cramps    Vital Signs: Vitals:   03/14/17 1350  BP: 122/74  Pulse: 74  SpO2: 96%  Weight: 183 lb 4 oz (83.1 kg)   Filed Weights   03/14/17 1350  Weight: 183 lb 4 oz (83.1 kg)   Physical Exam: General:  Well appearing. No resp difficulty.  HEENT: normal Neck: supple. JVP 6-7 with prominent CV waves Carotids 2+ bilat; carotid bruits L>R No lymphadenopathy or thryomegaly. Cor: PMI nondisplaced. Regular rate & rhythm. 2/6 TR, 2/6 AI Lungs: CTA Abdomen: soft, nontender, non distended. No hepatosplenomegaly. No bruits or masses. Good bowel sounds. Extremities: no cyanosis, clubbing, rash. No edema.  Neuro: alert & orientedx3, cranial nerves grossly intact. moves all 4 extremities w/o difficulty. Affect flat  ASSESSMENT/Plan  1. Chronic systolic HF: Ischemic cardiomyopathy.  Echo (2/16) with EF 25%. S/p [employer] ICD. Echo 3/18. EF 20-25% - Symptomatically much improved today. NYHA II - Volume status much improved with increased lasix and Entresto. Optivol looks good.  - Increase Entresto to 97/03 bid - On goal carvedilol 25 mg twice a day.  -Continue hydralazine 75 mg bid/ Imdur 60 bid.  - Continue spironolactone 12.5 mg daily.  - Will schedule CPX test to objectively assess VO2 - I discussed case with Dr. Prescott Gum. Given previous dissection and aortic root replacement likely not candidate for VAD. If CPX is bad will need to discuss  transplant candidacy with [facility]. 2. CAD: s/p CABG.   - Has occasional non-anginal CP. -Cardiolite 11/2015 with evidence for mild reversibility involving the anterolateral wall and a portion of the lateral wall. -Hav avoided cath due to dissection and CKD with solitary kidney (lost kidney due to dissection) - Continue Plavix and ASA 81 mg daily.   - Statin intolerance at any dose.  - Continue Zetia. - PCP follows lipids. At next visit refer to Lipid Clinic to consider PCSK-9 3. CKD in setting of solitary kidney s/p dissection -creatinine checked today and stable at 1.36 4. HTN: - stable. Well controlled 5. H/o Type I aortic dissection: Follows with CVTS.  - Ao root is 5.0 cm on echo (up from 4.8). I have discussed with Dr. Prescott Gum who feels it is overall stable. He will see him in f/u as well.  6. Erectile dysfunction -long talk about need to avoid using sildenafil if he has taken Imdur within last 24 hours. He is aware of the risks.   Total time spent 45 minutes. Over half that time spent discussing above.    Zola Runion,MD 2:08 PM

## 2017-03-18 DIAGNOSIS — R0683 Snoring: Secondary | ICD-10-CM

## 2017-03-18 NOTE — Procedures (Signed)
Patient Name: Chad [name], Chad [name] Date: 03/13/2017 Gender: Male D.O.B: [birth year] Age (years): 56 Referring Provider: Joyce Copa Height (inches): 71 Interpreting Physician: Baird Lyons MD, ABSM Weight (lbs): 180 RPSGT: Zadie Rhine BMI: 25 MRN: [id number] Neck Size: 15.00 CLINICAL INFORMATION The patient is referred for a split night study with BPAP. MEDICATIONS Medications self-administered by patient taken the night of the study : ASPIRIN, COREG, PLAVIX, LASIX, APRESOLINE, IMDUR, OXYCODONE HCL, ALDACTONE  SLEEP STUDY TECHNIQUE As per the AASM Manual for the Scoring of Sleep and Associated Events v2.3 (April 2016) with a hypopnea requiring 4% desaturations.  The channels recorded and monitored were frontal, central and occipital EEG, electrooculogram (EOG), submentalis EMG (chin), nasal and oral airflow, thoracic and abdominal wall motion, anterior tibialis EMG, snore microphone, electrocardiogram, and pulse oximetry. Bi-level positive airway pressure (BiPAP) was initiated when the patient met split night criteria and was titrated according to treat sleep-disordered breathing.  RESPIRATORY PARAMETERS Diagnostic  Total AHI (/hr): 42.9 RDI (/hr): 47.7 OA Index (/hr): 22.3 CA Index (/hr): 0.0 REM AHI (/hr): 15.4 NREM AHI (/hr): 51.3 Supine AHI (/hr): 63.2 Non-supine AHI (/hr): 7.00 Min O2 Sat (%): 84.00 Mean O2 (%): 91.10 Time below 88% (min): 16.3   Titration  Optimal IPAP Pressure (cm): 14 Optimal EPAP Pressure (cm): 10 AHI at Optimal Pressure (/hr): 16.0 Min O2 at Optimal Pressure (%): 91.0 Sleep % at Optimal (%): 12 Supine % at Optimal (%): 100      SLEEP ARCHITECTURE The study was initiated at 10:46:46 PM and terminated at 4:56:33 AM. The total recorded time was 369.8 minutes. EEG confirmed total sleep time was 272.5 minutes yielding a sleep efficiency of 73.7%. Sleep onset after lights out was 2.5 minutes with a REM latency of 74.5 minutes. The patient spent  11.19% of the night in stage N1 sleep, 68.62% in stage N2 sleep, 0.00% in stage N3 and 20.18% in REM. Wake after sleep onset (WASO) was 94.8 minutes. The Arousal Index was 37.0/hour.  LEG MOVEMENT DATA The total Periodic Limb Movements of Sleep (PLMS) were 0. The PLMS index was 0.00 .  CARDIAC DATA The 2 lead EKG demonstrated pacemaker generated. The mean heart rate was 71.62 beats per minute. Other EKG findings include: PVCs.  IMPRESSIONS - Severe obstructive sleep apnea occurred during the diagnostic portion of the study (AHI = 42.9 /hour). - CPAP provided inadequate control and was changed to BIPAP. An optimal BiPAP pressure was selected for this patient ( 14 / 10 cm of water) - Mild central sleep apnea occurred during the diagnostic portion of the study (CAI = 0.0/hour). - Severe oxygen desaturation was noted during the diagnostic portion of the study (Min O2 = 84.00%). - EKG findings include PVCs. - Clinically significant periodic limb movements of sleep did not occur during the study.  DIAGNOSIS - Obstructive Sleep Apnea (327.23 [G47.33 ICD-10])  RECOMMENDATIONS - Trial of BiPAP therapy on 14/10 cm H2O with a Medium size Resmed Full Face Mask AirFit F20 mask and heated humidification. - Avoid alcohol, sedatives and other CNS depressants that may worsen sleep apnea and disrupt normal sleep architecture. - Sleep hygiene should be reviewed to assess factors that may improve sleep quality. - Weight management and regular exercise should be initiated or continued.  [Electronically signed] 03/18/2017 10:49 AM  Baird Lyons MD, ABSM Diplomate, American Board of Sleep Medicine   NPI: [id number] [employer], American Board of Sleep Medicine  ELECTRONICALLY SIGNED ON:  03/18/2017, 10:38 AM [place] PH: [phone]   FX: [phone] ACCREDITED BY THE AMERICAN ACADEMY OF SLEEP MEDICINE

## 2017-03-28 IMAGING — DX DG CHEST 2V
2 series · 2 of 2 positions shown · non-contrast
Comparison: 10/04/2016

CLINICAL DATA: AICD insertion

EXAM:
CHEST  2 VIEW

[chest lat]
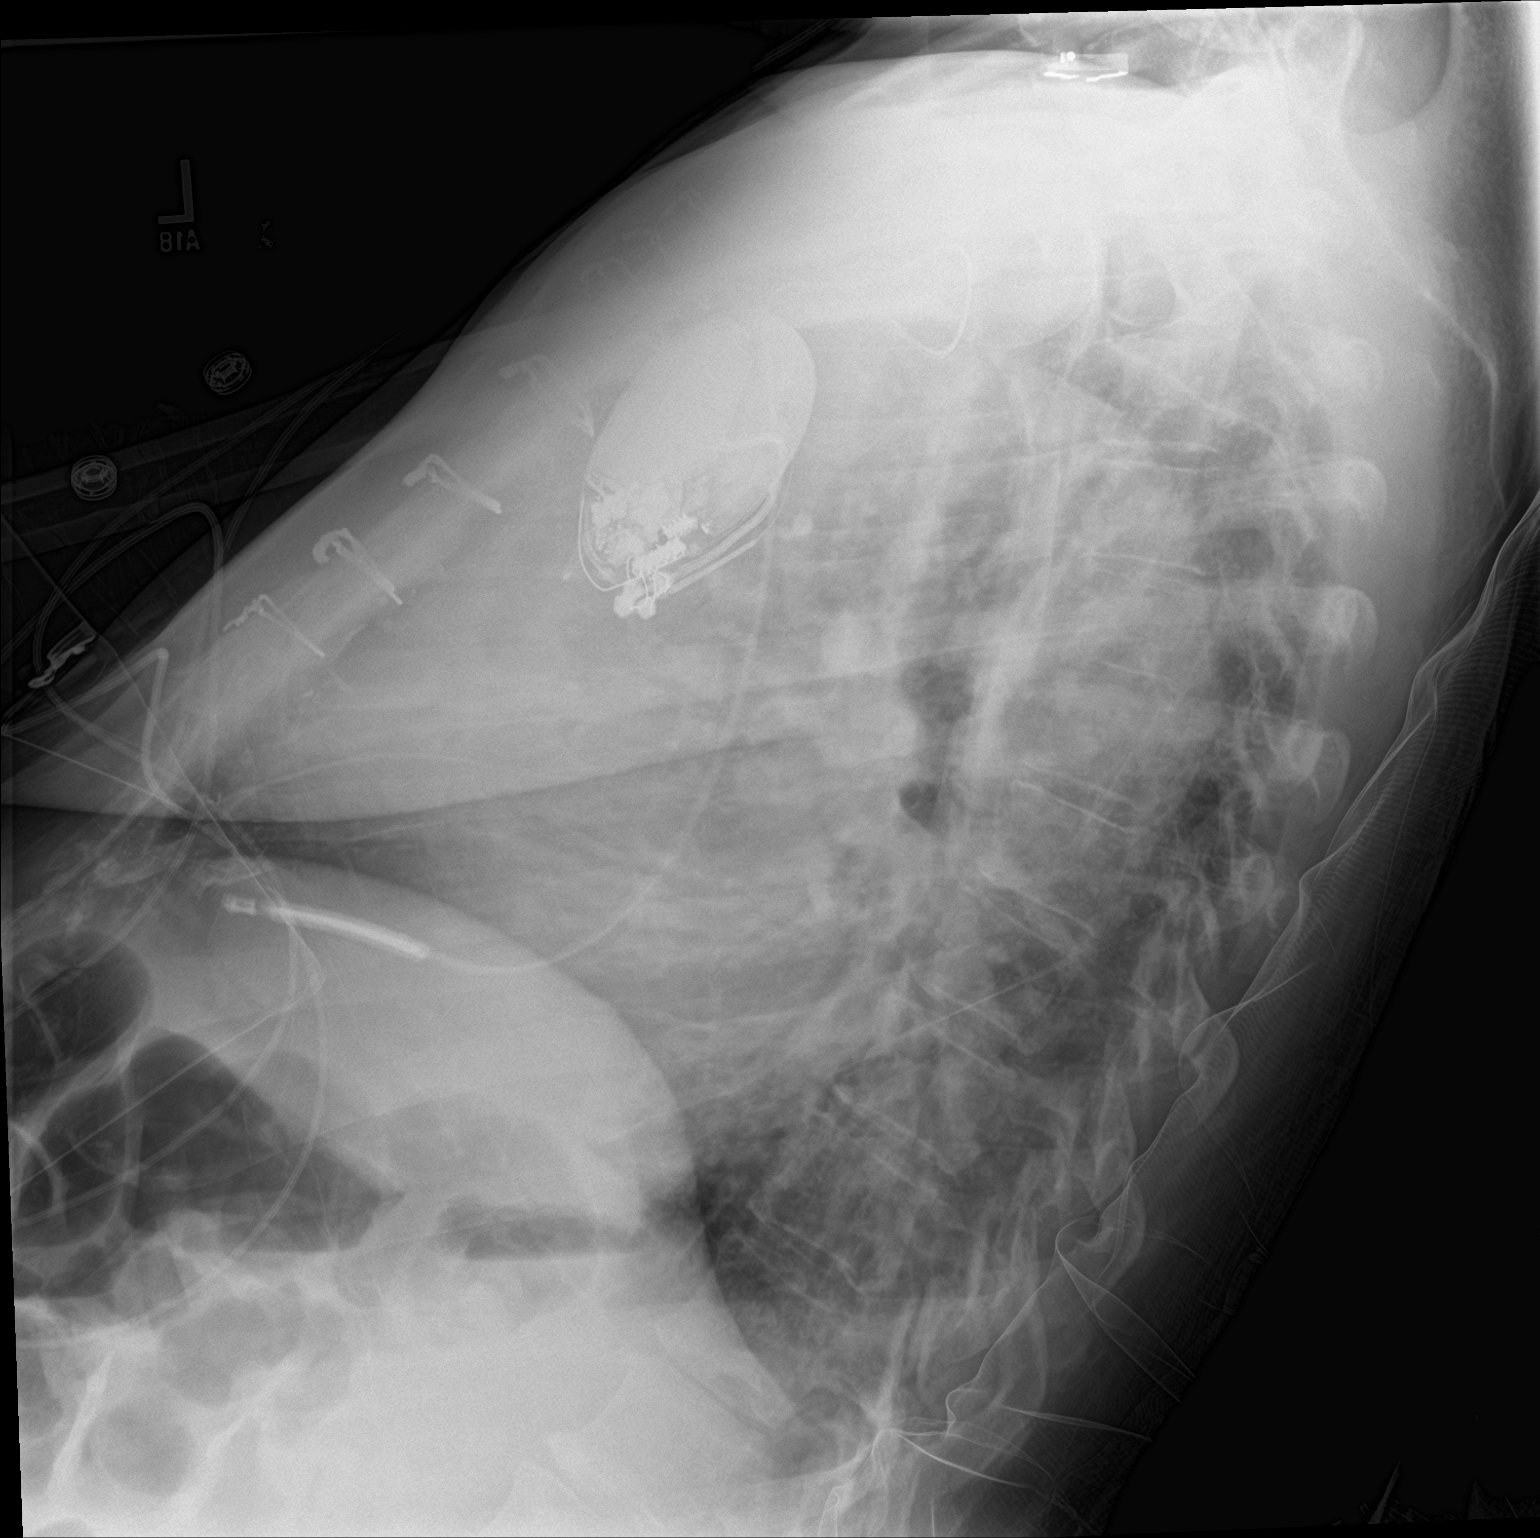

[chest ap]
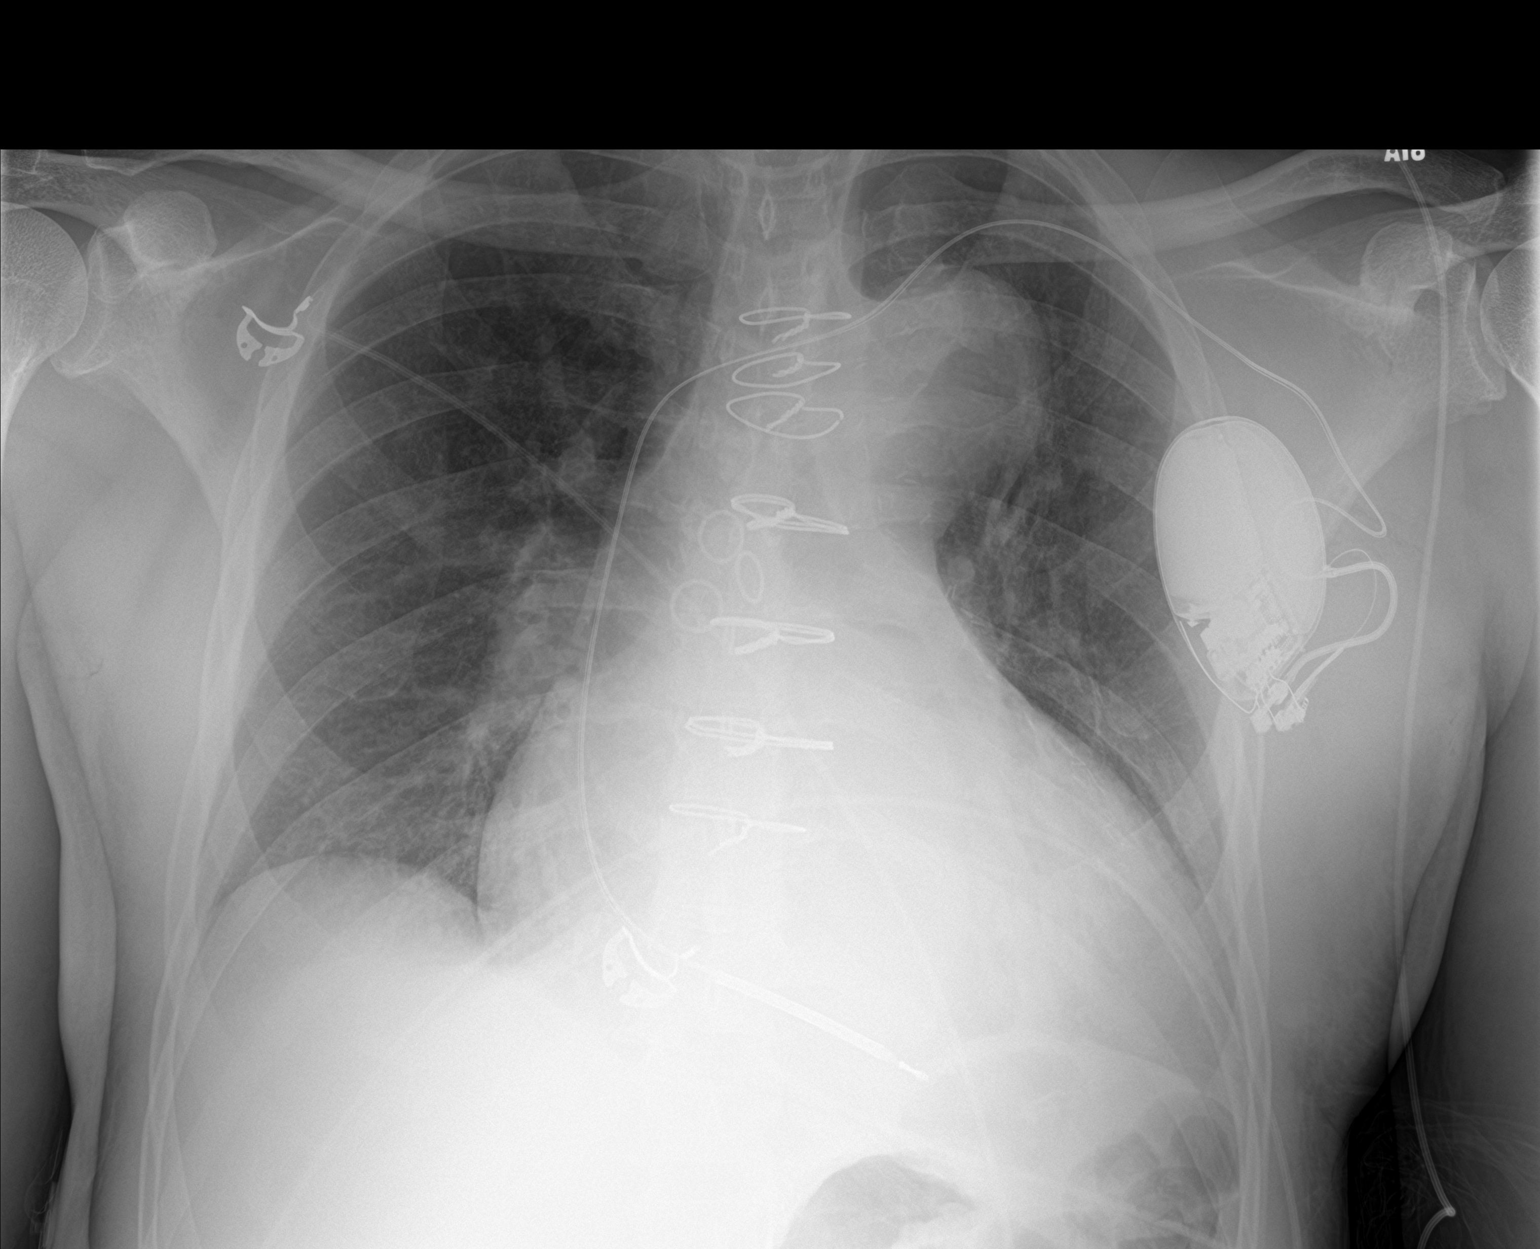

[2 of 2 positions shown; findings below may reference images not displayed]

FINDINGS: Replaced single chamber ICD/pacer wire with tip over the right
ventricular apex. Cardiopericardial enlargement is greater than on
comparison study, but no change in morphology typical of pericardial
effusion. Status post CABG. Unchanged elongated aorta which is
aneurysmal and dissected by CT in 0598. No pneumothorax. Mild
atelectasis especially on the left.
IMPRESSION: 1. Revised AICD without pneumothorax. Cardiopericardial size is
greater than on 10/04/2016 study, but no change in morphology
typical for pericardial effusion.
2. Known thoracic aortic aneurysm and dissection.

## 2017-04-11 ENCOUNTER — Other Ambulatory Visit: Payer: Self-pay | Admitting: *Deleted

## 2017-04-11 ENCOUNTER — Ambulatory Visit (INDEPENDENT_AMBULATORY_CARE_PROVIDER_SITE_OTHER): Payer: Medicaid Other | Admitting: Cardiothoracic Surgery

## 2017-04-11 ENCOUNTER — Encounter: Payer: Self-pay | Admitting: Cardiothoracic Surgery

## 2017-04-11 VITALS — BP 121/74 | HR 72 | Resp 16 | Ht 71.0 in | Wt 183.0 lb

## 2017-04-11 DIAGNOSIS — I7101 Dissection of thoracic aorta: Secondary | ICD-10-CM | POA: Diagnosis not present

## 2017-04-11 DIAGNOSIS — I71019 Dissection of thoracic aorta, unspecified: Secondary | ICD-10-CM

## 2017-04-11 NOTE — Progress Notes (Signed)
PCP is [name], MD Referring Provider is [name], MD  Chief Complaint  Patient presents with  . Thoracic Aortic Dissection    f/u with ECHO 02/02/17..NO CTA SINCE 2016  Patient examined, most recent CT of chest without contrast and echocardiogram personally reviewed and counseled with patient  HPI: 56 year old AA male with ischemic cardiomyopathy status post CABG 2010, status post repair of type A ascending thoracic aortic dissection in 2010 with persistent false lumen of the distal arch and descending thoracic aorta. He is followed in the advanced heart failure clinic with stage IIIb heart failure, ejection fraction 20-25 percent, moderate AI, moderate TR, moderate to severe MR. He was most recently admitted in December 2017 for AICD lead exchange. He was recently started on Entresto because of increasing heart failure symptoms and fluid retention. He is currently still working but has increasing fatigue. The patient has a single functioning kidney with a creatinine 1.4   Past Medical History:  Diagnosis Date  . AICD (automatic cardioverter/defibrillator) present   . Anemia   . Anginal pain ([place])   . Aortic dissection, thoracoabdominal ([place])    7/10: Type I s/p repair  . Asthma   . CAD (coronary artery disease)    a. s/p CABG 2006;  b. DES to PDA 2011 (cath: Dx not seen, dRCA/PDA tx with DES; S-PDA occluded (culprit), S-Dx occluded, S-RI and OM ok, L-LAD ok  . Carotid stenosis    dopplers 2011: 0-39% bilat.  . Chest pain syndrome   . CHF (congestive heart failure) ([place])   . Chronic bronchitis ([place])   . Chronic lower back pain   . Chronic systolic heart failure (HCC)    a. 12/13 ECHO: EF 35-40%, sept, apical & posterobasal HK, LV mod dil & sys fx mod reduced, mild AI, MV mild reg, TV mild reg  . Complication of anesthesia    "difficult to wake afterwards a couple of times"  . CRI (chronic renal insufficiency)    "one kidney is gone; the other is hanging on"  (11/07/2016)  . Dyspnea   . Family history of adverse reaction to anesthesia    "sister hard to wake up"  . GERD (gastroesophageal reflux disease)   . Gout   . Heart murmur   . HLD (hyperlipidemia)   . HTN (hypertension)    severe  . Myocardial infarction [facility])    "many" (11/07/2016)  . PONV (postoperative nausea and vomiting)   . Sleep apnea    "never RX'd mask" (11/07/2016)  . Thyroid cancer ([place])    Hertle Cell    Past Surgical History:  Procedure Laterality Date  . BACK SURGERY    . CARDIAC CATHETERIZATION     "several" (11/07/2016)  . CARDIAC DEFIBRILLATOR PLACEMENT  11/2005   [employer]; Archie Endo 05/09/2011  . CORONARY ANGIOPLASTY WITH STENT PLACEMENT     "I've had 1-2 stents" (11/07/2016)  . CORONARY ARTERY BYPASS GRAFT  06/21/2009   "CABG X2"  . CORONARY ARTERY BYPASS GRAFT  06/29/2005   "CABG X7"  . ICD LEAD REMOVAL  11/07/2016  . ICD LEAD REMOVAL N/A 11/07/2016   Procedure: ICD LEAD REMOVAL, INSERTION OF NEW ICD LEAD;  Surgeon: Evans Lance, MD;  Location: [place];  Service: Cardiovascular;  Laterality: N/A;  Dr. Prescott Gum to backup case  . IMPLANTABLE CARDIOVERTER DEFIBRILLATOR (ICD) GENERATOR CHANGE N/A 12/13/2012   Procedure: ICD GENERATOR CHANGE;  Surgeon: Evans Lance, MD;  Location: [facility] CATH LAB;  Service: Cardiovascular;  Laterality:  N/A;  . [place] SURGERY  01/2001    most recent within 5-10 years  . SHOULDER ARTHROSCOPY WITH ROTATOR CUFF REPAIR Bilateral   . Status post emergency repair of a type A ascending aortic dissection with a hemiarch reconstruction of the ascending aorta  using a 28-mm Hemashield graft with redo sternotomy and revision of previous bypass grafts in June 2010.    . TESTICLE SURGERY    . THYROIDECTOMY, PARTIAL  06/20/2011  . VASECTOMY      Family History  Problem Relation Age of Onset  . Hypertension Father   . Heart disease Father   . Early death Father   . COPD Father   . Hypertension Mother   . Coronary artery  disease      Social History Social History  Substance Use Topics  . Smoking status: Never Smoker  . Smokeless tobacco: Never Used  . Alcohol use No    Current Outpatient Prescriptions  Medication Sig Dispense Refill  . albuterol (PROVENTIL HFA;VENTOLIN HFA) 108 (90 Base) MCG/ACT inhaler Inhale 2 puffs into the lungs every 6 (six) hours as needed for wheezing or shortness of breath. 1 Inhaler 0  . aspirin 325 MG tablet Take 325 mg by mouth daily.    . carvedilol (COREG) 25 MG tablet Take 1 tablet (25 mg total) by mouth 2 (two) times daily with a meal. 60 tablet 6  . clopidogrel (PLAVIX) 75 MG tablet Take 1 tablet (75 mg total) by mouth daily. 30 tablet 3  . cyclobenzaprine (FLEXERIL) 10 MG tablet Take 10 mg by mouth daily as needed for muscle spasms.     . Flaxseed, Linseed, (FLAXSEED OIL PO) Take 2 capsules by mouth 2 (two) times daily.    . furosemide (LASIX) 40 MG tablet Take 1 tablet (40 mg total) by mouth 2 (two) times daily as needed for edema. 60 tablet 6  . hydrALAZINE (APRESOLINE) 50 MG tablet TAKE ONE & ONE-HALF TABLETS BY MOUTH THREE TIMES DAILY 405 tablet 3  . ibuprofen (ADVIL,MOTRIN) 200 MG tablet Take 600 mg by mouth every 6 (six) hours as needed (pain). Pt rarely takes    . Iron-Vitamins (GERITOL COMPLETE PO) Take 2 tablets by mouth daily.    . isosorbide mononitrate (IMDUR) 60 MG 24 hr tablet Take 1 tablet (60 mg total) by mouth 2 (two) times daily. 60 tablet 6  . naphazoline (NAPHCON) 0.1 % ophthalmic solution Place 1 drop into both eyes 4 (four) times daily as needed for irritation.    . nitroGLYCERIN (NITROSTAT) 0.4 MG SL tablet Place 1 tablet (0.4 mg total) under the tongue every 5 (five) minutes x 3 doses as needed for chest pain. 30 tablet 12  . Oxycodone HCl 10 MG TABS Take 20 mg by mouth 3 (three) times daily.     . potassium chloride (K-DUR) 10 MEQ tablet Take 1 tablet (10 mEq total) by mouth daily. 30 tablet 6  . sacubitril-valsartan (ENTRESTO) 97-103 MG Take 1  tablet by mouth 2 (two) times daily. 60 tablet 3  . sildenafil (VIAGRA) 100 MG tablet Take 0.5 tablets (50 mg total) by mouth daily as needed for erectile dysfunction. 10 tablet 0  . Spacer/Aero-Holding Chambers (AEROCHAMBER PLUS) inhaler Use as instructed 1 each 0  . spironolactone (ALDACTONE) 25 MG tablet Take 0.5 tablets (12.5 mg total) by mouth daily. 30 tablet 6  . Turmeric (CURCUMIN 95 PO) Take 2 tablets by mouth 2 (two) times daily.      No current facility-administered  medications for this visit.     Allergies  Allergen Reactions  . Iohexol Anaphylaxis and Other (See Comments)     Desc: PT HAS ANAPHYLAXIS WITH CONTRAST MEDIA!  . Lipitor [Atorvastatin Calcium] Anaphylaxis and Other (See Comments)    Large doses  . Shellfish Allergy Anaphylaxis  . Sulfonamide Derivatives Shortness Of Breath  . Sulfa Antibiotics Swelling  . Latex Rash    With long periods of exposure  . Zocor [Simvastatin] Other (See Comments)    Muscle cramps    Review of Systems  Patient is on Plavix for history of previous CABG and stents without bleeding Patient has history of sleep apnea No history of stroke or seizure No difficulty swallowing No symptoms of active dental disease No current ankle edema  BP 121/74 (BP Location: Right Arm, Patient Position: Sitting, Cuff Size: Large)   Pulse 72   Resp 16   Ht 5\' 11"  (1.803 m)   Wt 183 lb (83 kg)   SpO2 95% Comment: ON RA  BMI 25.52 kg/m  Physical Exam      Exam    General- alert and comfortable    Neck with positive JVD , left carotid bruit    Chest with 2 sternotomy scars   Lungs- clear without rales, wheezes   Cor- regular rate and rhythm, 2/6 murmur of MR , 1/6 murmur  positive S3 gallop   Abdomen- soft, non-tender   Extremities - warm, non-tender, minimal edema   Neuro- oriented, appropriate, no focal weakness   Diagnostic Tests: Echo and CT scan personally reviewed as noted above  Impression: Ischemic cardiomyopathy with  advancing heart failure EF 20-25 percent with history of 2 previous sternotomies-first surgery for CABG, second surgery for emergency repair of ascending dissection with reimplantation of vein grafts to Dacron graft  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 Would recommend he undergo evaluation at [facility], a more experienced VAD program with more options  than we could offer the patient.  Len Childs, MD [facility] [phone]

## 2017-04-12 ENCOUNTER — Ambulatory Visit (HOSPITAL_COMMUNITY): Payer: Medicaid Other | Attending: Internal Medicine

## 2017-04-12 DIAGNOSIS — I5022 Chronic systolic (congestive) heart failure: Secondary | ICD-10-CM | POA: Insufficient documentation

## 2017-04-13 ENCOUNTER — Other Ambulatory Visit (HOSPITAL_COMMUNITY): Payer: Self-pay | Admitting: *Deleted

## 2017-04-13 DIAGNOSIS — I5022 Chronic systolic (congestive) heart failure: Secondary | ICD-10-CM

## 2017-04-26 ENCOUNTER — Encounter (HOSPITAL_COMMUNITY): Payer: Medicaid Other | Admitting: Internal Medicine

## 2017-04-27 ENCOUNTER — Ambulatory Visit (HOSPITAL_COMMUNITY)
Admission: RE | Admit: 2017-04-27 | Discharge: 2017-04-27 | Disposition: A | Discharge status: Home / Self Care | Payer: Medicaid Other | Source: Ambulatory Visit | Attending: Internal Medicine | Admitting: Internal Medicine

## 2017-04-27 VITALS — BP 120/70 | HR 67 | Wt 185.5 lb

## 2017-04-27 DIAGNOSIS — I252 Old myocardial infarction: Secondary | ICD-10-CM | POA: Diagnosis not present

## 2017-04-27 DIAGNOSIS — Z79891 Long term (current) use of opiate analgesic: Secondary | ICD-10-CM | POA: Insufficient documentation

## 2017-04-27 DIAGNOSIS — K219 Gastro-esophageal reflux disease without esophagitis: Secondary | ICD-10-CM | POA: Insufficient documentation

## 2017-04-27 DIAGNOSIS — I5022 Chronic systolic (congestive) heart failure: Secondary | ICD-10-CM | POA: Diagnosis not present

## 2017-04-27 DIAGNOSIS — I2510 Atherosclerotic heart disease of native coronary artery without angina pectoris: Secondary | ICD-10-CM | POA: Diagnosis not present

## 2017-04-27 DIAGNOSIS — Z95810 Presence of automatic (implantable) cardiac defibrillator: Secondary | ICD-10-CM | POA: Diagnosis not present

## 2017-04-27 DIAGNOSIS — I130 Hypertensive heart and chronic kidney disease with heart failure and stage 1 through stage 4 chronic kidney disease, or unspecified chronic kidney disease: Secondary | ICD-10-CM | POA: Insufficient documentation

## 2017-04-27 DIAGNOSIS — Z85850 Personal history of malignant neoplasm of thyroid: Secondary | ICD-10-CM | POA: Insufficient documentation

## 2017-04-27 DIAGNOSIS — I10 Essential (primary) hypertension: Secondary | ICD-10-CM | POA: Diagnosis not present

## 2017-04-27 DIAGNOSIS — Z882 Allergy status to sulfonamides status: Secondary | ICD-10-CM | POA: Insufficient documentation

## 2017-04-27 DIAGNOSIS — M109 Gout, unspecified: Secondary | ICD-10-CM | POA: Insufficient documentation

## 2017-04-27 DIAGNOSIS — G4730 Sleep apnea, unspecified: Secondary | ICD-10-CM | POA: Diagnosis not present

## 2017-04-27 DIAGNOSIS — Z91040 Latex allergy status: Secondary | ICD-10-CM | POA: Insufficient documentation

## 2017-04-27 DIAGNOSIS — Z79899 Other long term (current) drug therapy: Secondary | ICD-10-CM | POA: Diagnosis not present

## 2017-04-27 DIAGNOSIS — Z951 Presence of aortocoronary bypass graft: Secondary | ICD-10-CM | POA: Insufficient documentation

## 2017-04-27 DIAGNOSIS — I255 Ischemic cardiomyopathy: Secondary | ICD-10-CM | POA: Diagnosis not present

## 2017-04-27 DIAGNOSIS — Z888 Allergy status to other drugs, medicaments and biological substances status: Secondary | ICD-10-CM | POA: Diagnosis not present

## 2017-04-27 DIAGNOSIS — J45909 Unspecified asthma, uncomplicated: Secondary | ICD-10-CM | POA: Diagnosis not present

## 2017-04-27 DIAGNOSIS — Z91013 Allergy to seafood: Secondary | ICD-10-CM | POA: Insufficient documentation

## 2017-04-27 DIAGNOSIS — N189 Chronic kidney disease, unspecified: Secondary | ICD-10-CM | POA: Insufficient documentation

## 2017-04-27 DIAGNOSIS — N529 Male erectile dysfunction, unspecified: Secondary | ICD-10-CM | POA: Diagnosis not present

## 2017-04-27 DIAGNOSIS — E785 Hyperlipidemia, unspecified: Secondary | ICD-10-CM | POA: Insufficient documentation

## 2017-04-27 NOTE — Progress Notes (Signed)
Advanced Heart Failure Clinic Note   Patient ID: Chad [name], male   DOB: 07/17/1961, 56 y.o.   MRN: [id number]   History of Present Illness: Primary Cardiologist:  Dr. Demetrios Loll is a 56 y.o. male with history of severe HTN, coronary artery disease status post previous myocardial infarction and bypass surgery in 2006 with DES to native PDA in 2011.  He also has a history congestive heart failure secondary to ischemic cardiomyopathy EF 20-25%   He is s/p single chamber [employer] ICD.  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 He underwent emergent repair of proximal aorta and reimplantation of his CABG grafts.  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 Also with significant low back pain s/p 2 surgeries.   He has had episodes of CP but have avoided cath due to previous dissection and solitary kidney.  Myoview 11/2015  1. Evidence for very mild reversibility and ischemia involving the anterolateral wall and a portion of the lateral wall. 2. Large infarct involving the inferior wall. There is dyskinesia in the inferior wall. 3. Left ventricular ejection fraction is 32%. Left ventricle dilatation.   In 11/17 admitted for ICD shocks due to fractured RV lead. Underwent lead extraction and replacement.   We saw him in 2/18 for acute visit due to increasing dyspnea and volume overload. ECHO EF 20-25% moderate RV dysfunction. Moderate AI and severe MR. Ao Root 43mm-> 24mm.   Since we last saw him. He saw Dr. 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 Feels a lot better. Can do almost anything he wants. Weight down about 10 pounds. No othopnea or PND.  BP well controlled   ICD interrogated personally. No shocks. Rare NSVT. No AF Volume looks ok. Activity level 4 hours per day   CPX 4/18   FVC 3.11 (73%)    FEV1 2.40 (71%)     FEV1/FVC 77 (97%)     MVV 117 (77%)  Resting HR: 75 Peak HR: 134  (81% age predicted max HR) BP rest: 120/68 BP peak: 146/82 Peak VO2: 18.6 (55% predicted peak VO2) VE/VCO2 slope: 31 OUES: 2.01 Peak RER: 1.05 Ventilatory Threshold: 16.0 (47% predicted or measured peak VO2) VE/MVV: 49% O2pulse: 13  (76% predicted O2pulse)   ECG: NSR with PVCs  3/18: ECHO EF 20-25% Moderate AI, moderate to severe MR, moderate TR. Ao Root 50cm 10/22/2015: ECHO EF 20-25% 11/26/12 ECHO EF 35-40%  Labs 11/25/13 Cholesterol 186 TG 121 HDL 49  Labs 12/09/13 K 3.6 creatinine 1.3  Labs 3/15 K 4.1, creatinine 1.4, BNP 589, HCT 39.8 Labs 3/16 K 3.8, creatinine 1.56, HCT 36.5 Labs 12/07/2015: K 3.6 Creatinine 1.37  Labs 05/01/2016: K 4.0 Creatinine 1.34   ROS: All pertinent positives and negatives as in HPI, otherwise negative.    Past Medical History:  Diagnosis Date  . AICD (automatic cardioverter/defibrillator) present   . Anemia   . Anginal pain ([place])   . Aortic dissection, thoracoabdominal ([place])    7/10: Type I s/p repair  . Asthma   . CAD (coronary artery disease)    a. s/p CABG 2006;  b. DES to PDA 2011 (cath: Dx not seen, dRCA/PDA tx with DES; S-PDA  occluded (culprit), S-Dx occluded, S-RI and OM ok, L-LAD ok  . Carotid stenosis    dopplers 2011: 0-39% bilat.  . Chest pain syndrome   . CHF (congestive heart failure) ([place])   . Chronic bronchitis ([place])   . Chronic lower back pain   . Chronic systolic heart failure (HCC)    a. 12/13 ECHO: EF 35-40%, sept, apical & posterobasal HK, LV mod dil & sys fx mod reduced, mild AI, MV mild reg, TV mild reg  . Complication of anesthesia    "difficult to wake afterwards a couple of times"  . CRI (chronic renal insufficiency)    "one kidney is gone; the other is  hanging on" (11/07/2016)  . Dyspnea   . Family history of adverse reaction to anesthesia    "sister hard to wake up"  . GERD (gastroesophageal reflux disease)   . Gout   . Heart murmur   . HLD (hyperlipidemia)   . HTN (hypertension)    severe  . Myocardial infarction [facility])    "many" (11/07/2016)  . PONV (postoperative nausea and vomiting)   . Sleep apnea    "never RX'd mask" (11/07/2016)  . Thyroid cancer ([place])    Hertle Cell    Current Outpatient Prescriptions  Medication Sig Dispense Refill  . albuterol (PROVENTIL HFA;VENTOLIN HFA) 108 (90 Base) MCG/ACT inhaler Inhale 2 puffs into the lungs every 6 (six) hours as needed for wheezing or shortness of breath. 1 Inhaler 0  . aspirin 325 MG tablet Take 325 mg by mouth daily.    . carvedilol (COREG) 25 MG tablet Take 1 tablet (25 mg total) by mouth 2 (two) times daily with a meal. 60 tablet 6  . clopidogrel (PLAVIX) 75 MG tablet Take 1 tablet (75 mg total) by mouth daily. 30 tablet 3  . cyclobenzaprine (FLEXERIL) 10 MG tablet Take 10 mg by mouth daily as needed for muscle spasms.     . Flaxseed, Linseed, (FLAXSEED OIL PO) Take 2 capsules by mouth 2 (two) times daily.    . furosemide (LASIX) 40 MG tablet Take 1 tablet (40 mg total) by mouth 2 (two) times daily as needed for edema. 60 tablet 6  . hydrALAZINE (APRESOLINE) 50 MG tablet TAKE ONE & ONE-HALF TABLETS BY MOUTH THREE TIMES DAILY 405 tablet 3  . ibuprofen (ADVIL,MOTRIN) 200 MG tablet Take 600 mg by mouth every 6 (six) hours as needed (pain). Pt rarely takes    . Iron-Vitamins (GERITOL COMPLETE PO) Take 2 tablets by mouth daily.    . isosorbide mononitrate (IMDUR) 60 MG 24 hr tablet Take 1 tablet (60 mg total) by mouth 2 (two) times daily. 60 tablet 6  . naphazoline (NAPHCON) 0.1 % ophthalmic solution Place 1 drop into both eyes 4 (four) times daily as needed for irritation.    . nitroGLYCERIN (NITROSTAT) 0.4 MG SL tablet Place 1 tablet (0.4 mg total) under the tongue every 5 (five)  minutes x 3 doses as needed for chest pain. 30 tablet 12  . Oxycodone HCl 10 MG TABS Take 20 mg by mouth 3 (three) times daily.     . potassium chloride (K-DUR) 10 MEQ tablet Take 1 tablet (10 mEq total) by mouth daily. 30 tablet 6  . sacubitril-valsartan (ENTRESTO) 97-103 MG Take 1 tablet by mouth 2 (two) times daily. 60 tablet 3  . sildenafil (VIAGRA) 100 MG tablet Take 0.5 tablets (50 mg total) by mouth daily as needed for erectile dysfunction. 10 tablet 0  . Spacer/Aero-Holding Josiah Lobo (  AEROCHAMBER PLUS) inhaler Use as instructed 1 each 0  . spironolactone (ALDACTONE) 25 MG tablet Take 0.5 tablets (12.5 mg total) by mouth daily. 30 tablet 6  . Turmeric (CURCUMIN 95 PO) Take 2 tablets by mouth 2 (two) times daily.      No current facility-administered medications for this encounter.     Allergies  Allergen Reactions  . Iohexol Anaphylaxis and Other (See Comments)     Desc: PT HAS ANAPHYLAXIS WITH CONTRAST MEDIA!  . Lipitor [Atorvastatin Calcium] Anaphylaxis and Other (See Comments)    Large doses  . Shellfish Allergy Anaphylaxis  . Sulfonamide Derivatives Shortness Of Breath  . Sulfa Antibiotics Swelling  . Latex Rash    With long periods of exposure  . Zocor [Simvastatin] Other (See Comments)    Muscle cramps    Vital Signs: Vitals:   04/27/17 1125  BP: 120/70  Pulse: 67  SpO2: 99%  Weight: 185 lb 8 oz (84.1 kg)   Filed Weights   04/27/17 1125  Weight: 185 lb 8 oz (84.1 kg)   Physical Exam: General:  Well appearing. No resp difficulty.  HEENT: normal Neck: supple. JVP 6-7 with prominent CV waves Carotids 2+ bilat; carotid bruits L>R No lymphadenopathy or thryomegaly. Cor: PMI nondisplaced. Regular rate & rhythm. 2/6 TR, 2/6 AI Lungs: CTA Abdomen: soft, nontender, non distended. No hepatosplenomegaly. No bruits or masses. Good bowel sounds. Extremities: no cyanosis, clubbing, rash. No edema.  Neuro: alert & orientedx3, cranial nerves grossly intact. moves all 4  extremities w/o difficulty. Affect flat  ASSESSMENT/Plan  1. Chronic systolic HF: Ischemic cardiomyopathy.  Echo (2/16) with EF 25%. S/p [employer] ICD. Echo 3/18. EF 20-25% - Stable NYHA II. Volume status looks good.  - Continue Entresto 97/103 bid - Continue carvedilol 25 mg twice a day.  - Continue hydralazine 75 mg bid/ Imdur 60 bid.  - Continue spironolactone 12.5 mg daily.  - CPX test reviewed with him and his wife personally. Shows moderate HF limitation. We discussed that results of CPX suggest he is too early for advanced therapies.  - I also discussed case with Dr. Prescott Gum. Given previous dissection and aortic root replacement likely not candidate for VAD. - He would like to arrange initial visit with [facility] to discuss future transplant candidacy. We will set up.  2. CAD: s/p CABG.   - Has occasional non-anginal CP. -Cardiolite 11/2015 with evidence for mild reversibility involving the anterolateral wall and a portion of the lateral wall. - Have avoided cath due to dissection and CKD with solitary kidney (lost kidney due to dissection) - Continue Plavix and ASA 81 mg daily.   - Statin intolerance at any dose.  - Continue Zetia. - PCP follows lipids. Given recent data can consider referral to [facility] to consider [facility] 3. CKD in setting of solitary kidney s/p dissection -creatinine checked recently. Stable at 1.36 4. HTN: - stable. Well controlled on current regimen 5. H/o Type I aortic dissection: Follows with CVTS.  - Ao root is 5.0 cm on echo (up from 4.8). I have discussed with Dr. Prescott Gum who feels it is overall stable. He will see him in f/u as well.  6. Erectile dysfunction -Reinforced need to avoid using sildenafil if he has taken Imdur within last 24 hours. He is aware of the risks.   Bensimhon, Daniel,MD 11:42 AM

## 2017-04-27 NOTE — Patient Instructions (Addendum)
You have been referred to Dr Melvyn Novas at [facility], for transplant eval   We will contact you in 4 months to schedule your next appointment.

## 2017-05-02 ENCOUNTER — Telehealth: Payer: Self-pay

## 2017-05-02 NOTE — Telephone Encounter (Signed)
Called and left VM for patient in regards to the PREP that Dr. Haroldine Laws has referred pt in to.

## 2017-05-09 ENCOUNTER — Encounter (HOSPITAL_COMMUNITY): Payer: Self-pay | Admitting: *Deleted

## 2017-05-09 NOTE — Progress Notes (Signed)
Referral and pt's records faxed to Dr Melvyn Novas at [facility] for heart transplant eval to [phone]

## 2017-05-14 ENCOUNTER — Ambulatory Visit (INDEPENDENT_AMBULATORY_CARE_PROVIDER_SITE_OTHER): Payer: Medicaid Other | Admitting: *Deleted

## 2017-05-14 DIAGNOSIS — R001 Bradycardia, unspecified: Secondary | ICD-10-CM | POA: Diagnosis not present

## 2017-05-14 NOTE — Progress Notes (Signed)
Remote ICD transmission.   

## 2017-05-16 ENCOUNTER — Encounter: Payer: Self-pay | Admitting: Cardiology

## 2017-05-17 LAB — CUP PACEART REMOTE DEVICE CHECK
Brady Statistic RV Percent Paced: 0 %
Date Time Interrogation Session: 20180524110906
HighPow Impedance: 62 Ohm
Implantable Pulse Generator Implant Date: 20131220
MDC IDC LEAD IMPLANT DT: 20171114
MDC IDC LEAD LOCATION: 753860
MDC IDC LEAD SERIAL: 333496
MDC IDC MSMT LEADCHNL RV IMPEDANCE VALUE: 507 Ohm
MDC IDC MSMT LEADCHNL RV SENSING INTR AMPL: 19.3 mV
Pulse Gen Serial Number: 124654

## 2017-05-18 ENCOUNTER — Telehealth (HOSPITAL_COMMUNITY): Payer: Self-pay

## 2017-05-18 NOTE — Telephone Encounter (Signed)
Last clinic OV note faxed to [facility] per [place] request to provided fax # [phone] (10 pages total).  Renee Pain, RN

## 2017-06-01 ENCOUNTER — Emergency Department (HOSPITAL_COMMUNITY)
Admission: EM | Admit: 2017-06-01 | Discharge: 2017-06-02 | Disposition: A | Discharge status: Home / Self Care | Payer: Medicaid Other | Attending: Emergency Medicine | Admitting: Emergency Medicine

## 2017-06-01 ENCOUNTER — Emergency Department (HOSPITAL_COMMUNITY): Payer: Medicaid Other

## 2017-06-01 ENCOUNTER — Encounter (HOSPITAL_COMMUNITY): Payer: Self-pay | Admitting: Emergency Medicine

## 2017-06-01 DIAGNOSIS — Z91040 Latex allergy status: Secondary | ICD-10-CM | POA: Diagnosis not present

## 2017-06-01 DIAGNOSIS — I2510 Atherosclerotic heart disease of native coronary artery without angina pectoris: Secondary | ICD-10-CM | POA: Diagnosis not present

## 2017-06-01 DIAGNOSIS — N400 Enlarged prostate without lower urinary tract symptoms: Secondary | ICD-10-CM | POA: Diagnosis not present

## 2017-06-01 DIAGNOSIS — M545 Low back pain, unspecified: Secondary | ICD-10-CM

## 2017-06-01 DIAGNOSIS — K8020 Calculus of gallbladder without cholecystitis without obstruction: Secondary | ICD-10-CM | POA: Diagnosis not present

## 2017-06-01 DIAGNOSIS — Z79899 Other long term (current) drug therapy: Secondary | ICD-10-CM | POA: Insufficient documentation

## 2017-06-01 DIAGNOSIS — Z7982 Long term (current) use of aspirin: Secondary | ICD-10-CM | POA: Diagnosis not present

## 2017-06-01 DIAGNOSIS — I5043 Acute on chronic combined systolic (congestive) and diastolic (congestive) heart failure: Secondary | ICD-10-CM | POA: Insufficient documentation

## 2017-06-01 DIAGNOSIS — Z85850 Personal history of malignant neoplasm of thyroid: Secondary | ICD-10-CM | POA: Diagnosis not present

## 2017-06-01 DIAGNOSIS — I252 Old myocardial infarction: Secondary | ICD-10-CM | POA: Insufficient documentation

## 2017-06-01 DIAGNOSIS — R931 Abnormal findings on diagnostic imaging of heart and coronary circulation: Secondary | ICD-10-CM | POA: Insufficient documentation

## 2017-06-01 DIAGNOSIS — Z951 Presence of aortocoronary bypass graft: Secondary | ICD-10-CM | POA: Diagnosis not present

## 2017-06-01 DIAGNOSIS — R109 Unspecified abdominal pain: Secondary | ICD-10-CM | POA: Diagnosis present

## 2017-06-01 DIAGNOSIS — N183 Chronic kidney disease, stage 3 (moderate): Secondary | ICD-10-CM | POA: Insufficient documentation

## 2017-06-01 DIAGNOSIS — J45909 Unspecified asthma, uncomplicated: Secondary | ICD-10-CM | POA: Diagnosis not present

## 2017-06-01 DIAGNOSIS — I130 Hypertensive heart and chronic kidney disease with heart failure and stage 1 through stage 4 chronic kidney disease, or unspecified chronic kidney disease: Secondary | ICD-10-CM | POA: Diagnosis not present

## 2017-06-01 DIAGNOSIS — Z95810 Presence of automatic (implantable) cardiac defibrillator: Secondary | ICD-10-CM | POA: Diagnosis not present

## 2017-06-01 DIAGNOSIS — I7100 Dissection of unspecified site of aorta: Secondary | ICD-10-CM

## 2017-06-01 LAB — CBC
HEMATOCRIT: 39.2 % (ref 39.0–52.0)
Hemoglobin: 12.6 g/dL — ABNORMAL LOW (ref 13.0–17.0)
MCH: 26.6 pg (ref 26.0–34.0)
MCHC: 32.1 g/dL (ref 30.0–36.0)
MCV: 82.9 fL (ref 78.0–100.0)
Platelets: 131 10*3/uL — ABNORMAL LOW (ref 150–400)
RBC: 4.73 MIL/uL (ref 4.22–5.81)
RDW: 15.1 % (ref 11.5–15.5)
WBC: 4.8 10*3/uL (ref 4.0–10.5)

## 2017-06-01 LAB — BASIC METABOLIC PANEL
Anion gap: 9 (ref 5–15)
BUN: 27 mg/dL — ABNORMAL HIGH (ref 6–20)
CALCIUM: 8.4 mg/dL — AB (ref 8.9–10.3)
CHLORIDE: 102 mmol/L (ref 101–111)
CO2: 26 mmol/L (ref 22–32)
Creatinine, Ser: 1.66 mg/dL — ABNORMAL HIGH (ref 0.61–1.24)
GFR calc Af Amer: 52 mL/min — ABNORMAL LOW (ref >60)
GFR calc non Af Amer: 45 mL/min — ABNORMAL LOW (ref >60)
Glucose, Bld: 111 mg/dL — ABNORMAL HIGH (ref 65–99)
Potassium: 3.6 mmol/L (ref 3.5–5.1)
Sodium: 137 mmol/L (ref 135–145)

## 2017-06-01 LAB — URINALYSIS, ROUTINE W REFLEX MICROSCOPIC
Bilirubin Urine: NEGATIVE
Glucose, UA: NEGATIVE mg/dL
HGB URINE DIPSTICK: NEGATIVE
Ketones, ur: NEGATIVE mg/dL
Leukocytes, UA: NEGATIVE
Nitrite: NEGATIVE
PH: 6 (ref 5.0–8.0)
PROTEIN: NEGATIVE mg/dL
SPECIFIC GRAVITY, URINE: 1.015 (ref 1.005–1.030)

## 2017-06-01 LAB — I-STAT TROPONIN, ED: Troponin i, poc: 0.01 ng/mL (ref 0.00–0.08)

## 2017-06-01 MED ORDER — LEVOFLOXACIN 500 MG PO TABS: 500.0000 mg | ORAL_TABLET | Freq: Every day | ORAL | 0 refills | Status: DC

## 2017-06-01 MED ORDER — SODIUM CHLORIDE 0.9 % IV BOLUS (SEPSIS)
500.0000 mL | Freq: Once | INTRAVENOUS | Status: AC
  Administered 2017-06-01: 500 mL via INTRAVENOUS

## 2017-06-01 MED ORDER — LEVOFLOXACIN 500 MG PO TABS
500.0000 mg | ORAL_TABLET | Freq: Once | ORAL | Status: AC
  Administered 2017-06-01: 500 mg via ORAL
  Filled 2017-06-01: qty 1

## 2017-06-01 MED ORDER — KETOROLAC TROMETHAMINE 30 MG/ML IJ SOLN
30.0000 mg | Freq: Once | INTRAMUSCULAR | Status: AC
  Administered 2017-06-01: 30 mg via INTRAVENOUS
  Filled 2017-06-01: qty 1

## 2017-06-01 MED ORDER — FENTANYL CITRATE (PF) 100 MCG/2ML IJ SOLN
100.0000 ug | Freq: Once | INTRAMUSCULAR | Status: AC
  Administered 2017-06-01: 100 ug via INTRAVENOUS
  Filled 2017-06-01: qty 2

## 2017-06-01 NOTE — ED Notes (Signed)
Pt has taken his home meds. Pt did not consult RN or MD before he did this. Pt made pharm tech aware that he had taken his meds.

## 2017-06-01 NOTE — ED Provider Notes (Addendum)
[facility] DEPT Provider Note   CSN: [id number] Arrival date & time: 06/01/17  1914     History   Chief Complaint Chief Complaint  Patient presents with  . Flank Pain    HPI Yong Corona is a 56 y.o. male.  HPI  Mr. Scalisi is a 56 year old man with a history of aortic dissection, coronary artery disease, and implantation of automatic internal cardioverter defibrillator who presents today complaining of low back pain that began 2-3 days ago and is severe in nature. It is somewhat worse with movement. He denies any numbness, tingling, or weakness. He has not had any associated abdominal pain although he does describe it as radiating out bilaterally towards his flanks. Nursing note states that he feels "like he is about to lose his other kidney" I read this to the patient he denies that it feels like his previous dissection. He denies any difficulty voiding, hesitancy, dysuria, or hematuria. He denies any chest pain, abdominal pain, or shortness of breath. He has had prior back surgery but has had no redness or inflammation around surgical site  Past Medical History:  Diagnosis Date  . AICD (automatic cardioverter/defibrillator) present   . Anemia   . Anginal pain ([place])   . Aortic dissection, thoracoabdominal ([place])    7/10: Type I s/p repair  . Asthma   . CAD (coronary artery disease)    a. s/p CABG 2006;  b. DES to PDA 2011 (cath: Dx not seen, dRCA/PDA tx with DES; S-PDA occluded (culprit), S-Dx occluded, S-RI and OM ok, L-LAD ok  . Carotid stenosis    dopplers 2011: 0-39% bilat.  . Chest pain syndrome   . CHF (congestive heart failure) ([place])   . Chronic bronchitis ([place])   . Chronic lower back pain   . Chronic systolic heart failure (HCC)    a. 12/13 ECHO: EF 35-40%, sept, apical & posterobasal HK, LV mod dil & sys fx mod reduced, mild AI, MV mild reg, TV mild reg  . Complication of anesthesia    "difficult to wake afterwards a couple of times"  . CRI (chronic renal  insufficiency)    "one kidney is gone; the other is hanging on" (11/07/2016)  . Dyspnea   . Family history of adverse reaction to anesthesia    "sister hard to wake up"  . GERD (gastroesophageal reflux disease)   . Gout   . Heart murmur   . HLD (hyperlipidemia)   . HTN (hypertension)    severe  . Myocardial infarction [facility])    "many" (11/07/2016)  . PONV (postoperative nausea and vomiting)   . Sleep apnea    "never RX'd mask" (11/07/2016)  . Thyroid cancer ([place])    [employer]    Patient Active Problem List   Diagnosis Date Noted  . Failure of implantable cardioverter-defibrillator (ICD) lead 11/07/2016  . Dyspnea 05/12/2016  . Erectile dysfunction 04/18/2016  . Pain in the chest   . Aneurysm of descending thoracic aorta ([place])   . Nasal folliculitis 53/66/4403  . Chronic systolic CHF (congestive heart failure) ([place]) 04/12/2015  . CKD (chronic kidney disease) stage 3, GFR 30-59 ml/min 04/12/2015  . Unstable angina ([place]) 01/25/2015  . Health care maintenance 03/19/2014  . Local reaction to tetanus vaccine 03/19/2014  . Neck pain on left side 06/03/2013  . Penile discharge 02/26/2013  . Syncope 01/17/2013  . Acute on chronic systolic and diastolic heart failure, NYHA class 3 ([place]) 01/16/2013  . Low back pain 04/23/2012  . Chest  pain syndrome   . Insomnia 09/14/2011  . 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 Coronary atherosclerosis of native coronary artery 06/12/2011  . Bradycardia 05/03/2011  . Aortic dissection, thoracoabdominal ([place])   . Chronic kidney disease (CKD), stage II (mild)   . ORTHOSTATIC HYPOTENSION 05/03/2010  . CAROTID BRUIT 01/25/2010  . PANCYTOPENIA 11/01/2009  . Fatigue 10/25/2009  . DISSECTING AORTIC ANEURYSM THORACOABDOMINAL 07/14/2009  . SCIATICA, RIGHT 05/18/2009  . HYPERLIPIDEMIA-MIXED 09/29/2008  . HYPERTENSION, BENIGN 09/29/2008  . CAD, ARTERY BYPASS GRAFT 09/29/2008  . SYSTOLIC HEART FAILURE, CHRONIC  09/29/2008  . ICD - IN SITU 09/29/2008    Past Surgical History:  Procedure Laterality Date  . BACK SURGERY    . CARDIAC CATHETERIZATION     "several" (11/07/2016)  . CARDIAC DEFIBRILLATOR PLACEMENT  11/2005   [employer]; Archie Endo 05/09/2011  . CORONARY ANGIOPLASTY WITH STENT PLACEMENT     "I've had 1-2 stents" (11/07/2016)  . CORONARY ARTERY BYPASS GRAFT  06/21/2009   "CABG X2"  . CORONARY ARTERY BYPASS GRAFT  06/29/2005   "CABG X7"  . ICD LEAD REMOVAL  11/07/2016  . ICD LEAD REMOVAL N/A 11/07/2016   Procedure: ICD LEAD REMOVAL, INSERTION OF NEW ICD LEAD;  Surgeon: Evans Lance, MD;  Location: [facility];  Service: Cardiovascular;  Laterality: N/A;  Dr. Prescott Gum to backup case  . IMPLANTABLE CARDIOVERTER DEFIBRILLATOR (ICD) GENERATOR CHANGE N/A 12/13/2012   Procedure: ICD GENERATOR CHANGE;  Surgeon: Evans Lance, MD;  Location: [facility] CATH LAB;  Service: Cardiovascular;  Laterality: N/A;  . LUMBAR [place] SURGERY  01/2001    most recent within 5-10 years  . SHOULDER ARTHROSCOPY WITH ROTATOR CUFF REPAIR Bilateral   . Status post emergency repair of a type A ascending aortic dissection with a hemiarch reconstruction of the ascending aorta  using a 28-mm Hemashield graft with redo sternotomy and revision of previous bypass grafts in June 2010.    . TESTICLE SURGERY    . THYROIDECTOMY, PARTIAL  06/20/2011  . VASECTOMY         Home Medications    Prior to Admission medications   Medication Sig Start Date End Date Taking? Authorizing Provider  albuterol (PROVENTIL HFA;VENTOLIN HFA) 108 (90 Base) MCG/ACT inhaler Inhale 2 puffs into the lungs every 6 (six) hours as needed for wheezing or shortness of breath. 05/12/16  Yes Olam Idler, MD  aspirin 325 MG tablet Take 162.5 mg by mouth daily.    Yes [provider]  carvedilol (COREG) 25 MG tablet Take 1 tablet (25 mg total) by mouth 2 (two) times daily with a meal. 01/31/17  Yes Bensimhon, Shaune Pascal, MD  clopidogrel (PLAVIX) 75  MG tablet Take 1 tablet (75 mg total) by mouth daily. Patient taking differently: Take 75 mg by mouth at bedtime.  01/31/17  Yes Bensimhon, Shaune Pascal, MD  cyclobenzaprine (FLEXERIL) 10 MG tablet Take 10 mg by mouth daily as needed for muscle spasms.    Yes [provider]  furosemide (LASIX) 40 MG tablet Take 1 tablet (40 mg total) by mouth 2 (two) times daily as needed for edema. Patient taking differently: Take 40 mg by mouth 2 (two) times daily.  01/31/17  Yes Bensimhon, Shaune Pascal, MD  hydrALAZINE (APRESOLINE) 50 MG tablet TAKE ONE & ONE-HALF TABLETS BY MOUTH THREE TIMES DAILY Patient taking differently: Take 50 mg by mouth 2 (two) times daily.  02/09/17  Yes Evans Lance, MD  isosorbide mononitrate (IMDUR) 60 MG  24 hr tablet Take 1 tablet (60 mg total) by mouth 2 (two) times daily. 01/31/17  Yes Bensimhon, Shaune Pascal, MD  naphazoline (NAPHCON) 0.1 % ophthalmic solution Place 1 drop into both eyes 4 (four) times daily as needed for irritation.   Yes [provider]  nitroGLYCERIN (NITROSTAT) 0.4 MG SL tablet Place 1 tablet (0.4 mg total) under the tongue every 5 (five) minutes x 3 doses as needed for chest pain. 06/05/16  Yes Bensimhon, Shaune Pascal, MD  Oxycodone HCl 10 MG TABS Take 20 mg by mouth 3 (three) times daily.    Yes [provider]  potassium chloride (K-DUR) 10 MEQ tablet Take 1 tablet (10 mEq total) by mouth daily. Patient taking differently: Take 10 mEq by mouth at bedtime.  01/31/17  Yes Bensimhon, Shaune Pascal, MD  sacubitril-valsartan (ENTRESTO) 49-51 MG Take 1 tablet by mouth 2 (two) times daily.   Yes [provider]  spironolactone (ALDACTONE) 25 MG tablet Take 0.5 tablets (12.5 mg total) by mouth daily. Patient taking differently: Take 12.5 mg by mouth at bedtime.  01/31/17  Yes Bensimhon, Shaune Pascal, MD  sacubitril-valsartan (ENTRESTO) 97-103 MG Take 1 tablet by mouth 2 (two) times daily. Patient not taking: Reported on 06/01/2017 03/14/17   Bensimhon, Shaune Pascal,  MD  sildenafil (VIAGRA) 100 MG tablet Take 0.5 tablets (50 mg total) by mouth daily as needed for erectile dysfunction. Patient not taking: Reported on 06/01/2017 12/26/16   Conrad Brackenridge, NP  Spacer/Aero-Holding Chambers (AEROCHAMBER PLUS) inhaler Use as instructed 05/12/16   Olam Idler, MD    Family History Family History  Problem Relation Age of Onset  . Hypertension Father   . Heart disease Father   . Early death Father   . COPD Father   . Hypertension Mother   . Coronary artery disease Unknown     Social History Social History  Substance Use Topics  . Smoking status: Never Smoker  . Smokeless tobacco: Never Used  . Alcohol use No     Allergies   Iohexol; Lipitor [atorvastatin calcium]; Shellfish allergy; Sulfa antibiotics; Sulfonamide derivatives; Latex; and Zocor [simvastatin]   Review of Systems Review of Systems  All other systems reviewed and are negative.    Physical Exam Updated Vital Signs BP 122/82   Pulse 72   Temp 98.6 F (37 C) (Oral)   Resp 15   Ht 1.803 m (5\' 11" )   Wt 83.5 kg (184 lb)   SpO2 94%   BMI 25.66 kg/m   Physical Exam  Constitutional: He is oriented to person, place, and time. He appears well-developed and well-nourished.  HENT:  Head: Normocephalic and atraumatic.  Right Ear: External ear normal.  Left Ear: External ear normal.  Nose: Nose normal.  Mouth/Throat: Oropharynx is clear and moist.  Eyes: Conjunctivae and EOM are normal. Pupils are equal, round, and reactive to light.  Neck: Normal range of motion. Neck supple.  Cardiovascular: Normal rate, regular rhythm, normal heart sounds and intact distal pulses.   Pulmonary/Chest: Effort normal and breath sounds normal.  Abdominal: Soft. Bowel sounds are normal. He exhibits no distension and no mass. There is no tenderness. There is no guarding.  Musculoskeletal: Normal range of motion.  Femoral pulses are 2+ and equal bilaterally dorsal pedalis pulses are intact    Neurological: He is alert and oriented to person, place, and time. He has normal reflexes.  Skin: Skin is warm and dry.  Psychiatric: He has a normal mood and affect.  His behavior is normal. Judgment and thought content normal.  Nursing note and vitals reviewed.    ED Treatments / Results  Labs (all labs ordered are listed, but only abnormal results are displayed) Labs Reviewed  CBC - Abnormal; Notable for the following:       Result Value   Hemoglobin 12.6 (*)    Platelets 131 (*)    All other components within normal limits  BASIC METABOLIC PANEL - Abnormal; Notable for the following:    Glucose, Bld 111 (*)    BUN 27 (*)    Creatinine, Ser 1.66 (*)    Calcium 8.4 (*)    GFR calc non Af Amer 45 (*)    GFR calc Af Amer 52 (*)    All other components within normal limits  URINALYSIS, ROUTINE W REFLEX MICROSCOPIC  I-STAT TROPOININ, ED    EKG  EKG Interpretation None       Radiology Ct Abdomen Pelvis Wo Contrast  Result Date: 06/01/2017 CLINICAL DATA:  Low back pain. History of aortic dissection. Contrast allergy. Status post repair of type B ascending aortic dissection with hemi arch reconstruction. EXAM: CT CHEST, ABDOMEN AND PELVIS WITHOUT CONTRAST TECHNIQUE: Multidetector CT imaging of the chest, abdomen and pelvis was performed following the standard protocol without IV contrast. COMPARISON:  12/05/2015 FINDINGS: CT CHEST FINDINGS Cardiovascular: Status post ascending aortic repair, without acute complication identified. The aortic root measures 4.5 cm today on image 39/series 3 versus 4.5 cm on 10/11/2014. The extent of dissection cannot be evaluated on this noncontrast exam. The right brachiocephalic artery measures 3.6 cm on image 19/series 3 versus similar on the prior (when remeasured). Other great vessels are normal in caliber. The transverse aorta measures 4.9 cm on image 23/series 3 today versus 4.3 cm on the prior exam at the same level. The proximal descending  aorta measures 4.7 cm also on image 23/series 3 versus 4.4 cm on the prior. No distal descending aortic dilatation. Tortuosity. Moderate cardiomegaly with pacer in place. No mediastinal hematoma. No pericardial effusion. Mediastinum/Nodes: Calcified mediastinal and right hilar nodes are likely related to old granulomatous disease. Lungs/Pleura: No pleural fluid. Calcified right apical granulomas. Minimal motion degradation inferiorly. Musculoskeletal: No acute osseous abnormality. CT ABDOMEN PELVIS FINDINGS Hepatobiliary: Normal noncontrast appearance of the liver. Multiple small gallstones without acute cholecystitis or biliary duct dilatation. Pancreas: Normal, without mass or ductal dilatation. Spleen: Normal in size, without focal abnormality. Adrenals/Urinary Tract: Normal adrenal glands. Marked left renal atrophy. No renal calculi or hydronephrosis. No hydroureter or ureteric calculi. No bladder calculi. Bladder is borderline thick walled. Stomach/Bowel: Normal stomach, without wall thickening. Extensive colonic diverticulosis. There is pericolonic inflammation about the proximal sigmoid, including on image 108/ series 3. No pericolonic fluid collection or free extraluminal gas. Normal terminal ileum and appendix. Normal small bowel. Vascular/Lymphatic: Chronic dissection within the aorta as evidenced by apparent intraluminal calcifications, including image 86/series 3. The aorta is similar in caliber, maximally 3.3 cm on image 87/series 3. Bilateral common iliac artery dilatation, including at 1.6 cm on the right and 2.0 cm on the left. Similar. No abdominopelvic adenopathy. Reproductive: Mild prostatomegaly. Low-density in the right inguinal canal is similar and likely represents a non distended testes. Other: No significant free fluid. Musculoskeletal: Degenerative disc disease at L4-5 and L5-S1. IMPRESSION: 1. Status post ascending aortic repair. Dilatation of the aortic root appears similar back to 2015.  2. Similar dilatation of the right brachiocephalic artery. Slight increase in transverse and proximal descending aortic dilatation.  The extent of dissection cannot be evaluated secondary to noncontrast technique. No surrounding hematoma identified. 3. Similar infrarenal abdominal aortic and common iliac artery dilatation compared to 2016. 4. Sigmoid diverticulitis, non complicated. 5. Cholelithiasis. 6. Marked left renal atrophy. 7. Prostatomegaly with borderline bladder wall thickening, suggesting a component of outlet obstruction. 8. Low-density in the right inguinal canal likely represents a nondistended right testicle. Similar. 9. Minimal motion in the chest. Electronically Signed   By: Abigail Miyamoto M.D.   On: 06/01/2017 21:55   Dg Chest 2 View  Result Date: 06/01/2017 CLINICAL DATA:  Bilateral flank pain. Mid back pain. Personal history of dissection. EXAM: CHEST  2 VIEW COMPARISON:  Two-view chest x-Anaston Koehn 11/08/2016 FINDINGS: Heart is enlarged. Left subclavian AICD is stable. Median sternotomy for CABG is noted. Prominence of the aortic arch is stable. There is no edema or effusion. No focal airspace disease is present. IMPRESSION: 1. No acute cardiopulmonary disease. 2. Stable cardiomegaly without failure. 3. Stable prominence of the aortic arch compatible with known chronic dissection. Electronically Signed   By: San Morelle M.D.   On: 06/01/2017 20:09   Ct Chest Wo Contrast  Result Date: 06/01/2017 CLINICAL DATA:  Low back pain. History of aortic dissection. Contrast allergy. Status post repair of type B ascending aortic dissection with hemi arch reconstruction. EXAM: CT CHEST, ABDOMEN AND PELVIS WITHOUT CONTRAST TECHNIQUE: Multidetector CT imaging of the chest, abdomen and pelvis was performed following the standard protocol without IV contrast. COMPARISON:  12/05/2015 FINDINGS: CT CHEST FINDINGS Cardiovascular: Status post ascending aortic repair, without acute complication identified. The  aortic root measures 4.5 cm today on image 39/series 3 versus 4.5 cm on 10/11/2014. The extent of dissection cannot be evaluated on this noncontrast exam. The right brachiocephalic artery measures 3.6 cm on image 19/series 3 versus similar on the prior (when remeasured). Other great vessels are normal in caliber. The transverse aorta measures 4.9 cm on image 23/series 3 today versus 4.3 cm on the prior exam at the same level. The proximal descending aorta measures 4.7 cm also on image 23/series 3 versus 4.4 cm on the prior. No distal descending aortic dilatation. Tortuosity. Moderate cardiomegaly with pacer in place. No mediastinal hematoma. No pericardial effusion. Mediastinum/Nodes: Calcified mediastinal and right hilar nodes are likely related to old granulomatous disease. Lungs/Pleura: No pleural fluid. Calcified right apical granulomas. Minimal motion degradation inferiorly. Musculoskeletal: No acute osseous abnormality. CT ABDOMEN PELVIS FINDINGS Hepatobiliary: Normal noncontrast appearance of the liver. Multiple small gallstones without acute cholecystitis or biliary duct dilatation. Pancreas: Normal, without mass or ductal dilatation. Spleen: Normal in size, without focal abnormality. Adrenals/Urinary Tract: Normal adrenal glands. Marked left renal atrophy. No renal calculi or hydronephrosis. No hydroureter or ureteric calculi. No bladder calculi. Bladder is borderline thick walled. Stomach/Bowel: Normal stomach, without wall thickening. Extensive colonic diverticulosis. There is pericolonic inflammation about the proximal sigmoid, including on image 108/ series 3. No pericolonic fluid collection or free extraluminal gas. Normal terminal ileum and appendix. Normal small bowel. Vascular/Lymphatic: Chronic dissection within the aorta as evidenced by apparent intraluminal calcifications, including image 86/series 3. The aorta is similar in caliber, maximally 3.3 cm on image 87/series 3. Bilateral common iliac  artery dilatation, including at 1.6 cm on the right and 2.0 cm on the left. Similar. No abdominopelvic adenopathy. Reproductive: Mild prostatomegaly. Low-density in the right inguinal canal is similar and likely represents a non distended testes. Other: No significant free fluid. Musculoskeletal: Degenerative disc disease at L4-5 and L5-S1. IMPRESSION: 1. Status  post ascending aortic repair. Dilatation of the aortic root appears similar back to 2015. 2. Similar dilatation of the right brachiocephalic artery. Slight increase in transverse and proximal descending aortic dilatation. The extent of dissection cannot be evaluated secondary to noncontrast technique. No surrounding hematoma identified. 3. Similar infrarenal abdominal aortic and common iliac artery dilatation compared to 2016. 4. Sigmoid diverticulitis, non complicated. 5. Cholelithiasis. 6. Marked left renal atrophy. 7. Prostatomegaly with borderline bladder wall thickening, suggesting a component of outlet obstruction. 8. Low-density in the right inguinal canal likely represents a nondistended right testicle. Similar. 9. Minimal motion in the chest. Electronically Signed   By: Abigail Miyamoto M.D.   On: 06/01/2017 21:55    Procedures Procedures (including critical care time)  Medications Ordered in ED Medications  sodium chloride 0.9 % bolus 500 mL (500 mLs Intravenous New Bag/Given 06/01/17 2106)  fentaNYL (SUBLIMAZE) injection 100 mcg (100 mcg Intravenous Given 06/01/17 2109)  ketorolac (TORADOL) 30 MG/ML injection 30 mg (30 mg Intravenous Given 06/01/17 2107)     Initial Impression / Assessment and Plan / ED Course  I have reviewed the triage vital signs and the nursing notes.  Pertinent labs & imaging results that were available during my care of the patient were reviewed by me and considered in my medical decision making (see chart for details).   1- low back pain- ? Ms vs #4 2- aortic dissection s/p repair From office note  04/11/17 56 year old AA male with ischemic cardiomyopathy status post CABG 2010, status post repair of type A ascending thoracic aortic dissection in 2010 with persistent false lumen of the distal arch and descending thoracic aorta. He is followed in the advanced heart failure clinic with stage IIIb heart failure, ejection fraction 20-25 percent, moderate AI, moderate TR, moderate to severe MR. He was most recently admitted in December 2017 for AICD lead exchange. He was recently started on Entresto because of increasing heart failure symptoms and fluid retention. He is currently still working but has increasing fatigue. The patient has a single functioning kidney with a creatinine 1.4 Today-transverse aorta measures 4.9 cm on image 23/series 3 today versus 4.3 cm on the prior exam at the same level.  The proximal descending aorta measures 4.7 cm also on image Discussed with Dr. Cyndia Bent and given size of aorta able to f/u with Dr. Nils Pyle in office 23/series 3 versus 4.4 cm on the prior. No distal descending aortic dilatation. Tortuosity. Moderate cardiomegaly  3- renal insufficiency with last creatinine at 1/36, no1.66 but previously 1.68- patient advised regarding close follow up.  4-prostatomegaly with bladder wall thickening- plan abx, f/u urology 5-choleliathisis- f/u pmd, no apparent symptoms now    Final Clinical Impressions(s) / ED Diagnoses   Final diagnoses:  Acute midline low back pain without sciatica  Prostate enlargement  Calculus of gallbladder without cholecystitis without obstruction    New Prescriptions New Prescriptions   LEVOFLOXACIN (LEVAQUIN) 500 MG TABLET    Take 1 tablet (500 mg total) by mouth daily.     Pattricia Boss, MD 06/01/17 [id number]    Pattricia Boss, MD 06/01/17 [phone]

## 2017-06-01 NOTE — Discharge Instructions (Signed)
Please follow up with Dr. Nils Pyle for recheck of your aorta Call Dr. Junious Silk to recheck your prostate Your creatinine is elevated at 1.6 today. Drink plenty of fluids and have your creatinine rechecked next week.

## 2017-06-01 NOTE — ED Notes (Signed)
Pt verbalized understanding discharge instructions and denies any further needs or questions at this time. VS stable, ambulatory and steady gait.   

## 2017-06-01 NOTE — Progress Notes (Signed)
[facility] Report   Patient Details  Name: Chad [name] MRN: [id number] Date of Birth: 1961/02/10 Age: 56 y.o. PCP: [name], MD  Vitals:   05/28/17 0941  BP: 122/80  Pulse: 66  Resp: 18  SpO2: 97%         [facility] Eval - 06/01/17 0900      Referral    Referring Provider Dr. Haroldine Laws   Reason for referral Heart Failure;Hypertension   Program Start Date 05/28/17     Measurement   Neck measurement 14 Inches   Waist Circumference 37 inches   Body fat 20.5 percent     Information for Trainer   Goals to feel better, have more energy, to strengthen core and back.   Current Exercise physical labor w/work & walks occasionally.    Orthopedic Concerns back surgeries x 2, sciatica-both legs   Pertinent Medical History see chart   Current Barriers fatigue due to [facility]  Works up to 32 hrs/wk.   Medications that affect exercise Beta blocker     Timed Up and Go (TUGS)   Timed Up and Go Low risk <9 seconds     Mobility and Daily Activities   I find it easy to walk up or down two or more flights of stairs. 2   I have no trouble taking out the trash. 4   I do housework such as vacuuming and dusting on my own without difficulty. 3   I can easily lift a gallon of milk (8lbs). 4   I can easily walk a mile. 3   I have no trouble reaching into high cupboards or reaching down to pick up something from the floor. 4   I do not have trouble doing out-door work such as Armed forces logistics/support/administrative officer, raking leaves, or gardening. 3     Mobility and Daily Activities   I feel younger than my age. 1   I feel independent. 4   I feel energetic. 2   I live an active life.  4   I feel strong. 3   I feel healthy. 1   I feel active as other people my age. 3     How fit and strong are you.   Fit and Strong Total Score 41     Past Medical History:  Diagnosis Date  . AICD (automatic cardioverter/defibrillator) present   . Anemia   . Anginal pain ([place])   . Aortic dissection,  thoracoabdominal ([place])    7/10: Type I s/p repair  . Asthma   . CAD (coronary artery disease)    a. s/p CABG 2006;  b. DES to PDA 2011 (cath: Dx not seen, dRCA/PDA tx with DES; S-PDA occluded (culprit), S-Dx occluded, S-RI and OM ok, L-LAD ok  . Carotid stenosis    dopplers 2011: 0-39% bilat.  . Chest pain syndrome   . CHF (congestive heart failure) ([place])   . Chronic bronchitis ([place])   . Chronic lower back pain   . Chronic systolic heart failure (HCC)    a. 12/13 ECHO: EF 35-40%, sept, apical & posterobasal HK, LV mod dil & sys fx mod reduced, mild AI, MV mild reg, TV mild reg  . Complication of anesthesia    "difficult to wake afterwards a couple of times"  . 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 Dyspnea   . Family history of adverse reaction to anesthesia    "  sister hard to wake up"  . GERD (gastroesophageal reflux disease)   . Gout   . Heart murmur   . HLD (hyperlipidemia)   . HTN (hypertension)    severe  . Myocardial infarction [facility])    "many" (11/07/2016)  . PONV (postoperative nausea and vomiting)   . Sleep apnea    "never RX'd mask" (11/07/2016)  . 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 BACK SURGERY    . CARDIAC CATHETERIZATION     "several" (11/07/2016)  . CARDIAC DEFIBRILLATOR PLACEMENT  11/2005   [employer]; Archie Endo 05/09/2011  . CORONARY ANGIOPLASTY WITH STENT PLACEMENT     "I've had 1-2 stents" (11/07/2016)  . CORONARY ARTERY BYPASS GRAFT  06/21/2009   "CABG X2"  . CORONARY ARTERY BYPASS GRAFT  06/29/2005   "CABG X7"  . ICD LEAD REMOVAL  11/07/2016  . ICD LEAD REMOVAL N/A 11/07/2016   Procedure: ICD LEAD REMOVAL, INSERTION OF NEW ICD LEAD;  Surgeon: Evans Lance, MD;  Location: [facility];  Service: Cardiovascular;  Laterality: N/A;  Dr. Prescott Gum to backup case  . IMPLANTABLE CARDIOVERTER DEFIBRILLATOR (ICD) GENERATOR CHANGE N/A 12/13/2012    Procedure: ICD GENERATOR CHANGE;  Surgeon: Evans Lance, MD;  Location: [facility] C[facility] LAB;  Service: Cardiovascular;  Laterality: N/A;  . LUMBAR [place] SURGERY  01/2001    most recent within 5-10 years  . SHOULDER ARTHROSCOPY WITH ROTATOR CUFF REPAIR Bilateral   . Status post emergency repair of a type A ascending aortic dissection with a hemiarch reconstruction of the ascending aorta  using a 28-mm Hemashield graft with redo sternotomy and revision of previous bypass grafts in June 2010.    . TESTICLE SURGERY    . THYROIDECTOMY, PARTIAL  06/20/2011  . VASECTOMY     History  Smoking Status  . Never Smoker  Smokeless Tobacco  . Never Used     0 Wellness sessions attended.  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  Some days he has energy and some days he doesn't. Fatigue is one of his greatest barriers.  He has lived w/ CHF since was 56 years old and is familiar with a low sodium diet.  He also states he wants to be challenged, but knows his limits and will listen to his body.      Emrah's preferred day for training is Tuesday, Wednesday, Thursday and Friday and preferred time is Evening ([place])   Vanita Ingles 06/01/2017, 9:47 AM

## 2017-06-01 NOTE — ED Triage Notes (Signed)
Pt reports bilateral flank pain, worse on R. Present X2-3 days. States he has had a kidney removed d/t hx of aortic dissection. He feels like "he is about to lose the other kidney" Denies dysuria, hematuria, CP, SOB.

## 2017-06-06 DIAGNOSIS — C73 Malignant neoplasm of thyroid gland: Secondary | ICD-10-CM | POA: Diagnosis not present

## 2017-06-06 DIAGNOSIS — N182 Chronic kidney disease, stage 2 (mild): Secondary | ICD-10-CM | POA: Insufficient documentation

## 2017-06-06 DIAGNOSIS — I5022 Chronic systolic (congestive) heart failure: Secondary | ICD-10-CM | POA: Diagnosis not present

## 2017-06-06 DIAGNOSIS — E890 Postprocedural hypothyroidism: Secondary | ICD-10-CM | POA: Insufficient documentation

## 2017-06-06 DIAGNOSIS — I7103 Dissection of thoracoabdominal aorta: Secondary | ICD-10-CM | POA: Diagnosis not present

## 2017-06-06 DIAGNOSIS — IMO0002 Reserved for concepts with insufficient information to code with codable children: Secondary | ICD-10-CM | POA: Insufficient documentation

## 2017-06-06 DIAGNOSIS — Z9089 Acquired absence of other organs: Secondary | ICD-10-CM

## 2017-06-06 DIAGNOSIS — Z9889 Other specified postprocedural states: Secondary | ICD-10-CM | POA: Insufficient documentation

## 2017-06-06 NOTE — Progress Notes (Signed)
[facility] PREP Weekly Session   Patient Details  Name: Sewell Pitner MRN: [id number] Date of Birth: [birth year] Age: 56 y.o. PCP: [name], MD  Vitals:   06/04/17 0907  Weight: 184 lb (83.5 kg)        [facility] Weekly seesion - 06/06/17 0900      Weekly Session   Topic Discussed Other  Guest speaker   Minutes exercised this week --  not reported   Classes attended to date 1     Thing you are grateful for:"all things good and not so good"  Vanita Ingles 06/06/2017, 9:08 AM

## 2017-06-29 ENCOUNTER — Other Ambulatory Visit (HOSPITAL_COMMUNITY): Payer: Self-pay | Admitting: Internal Medicine

## 2017-07-16 IMAGING — US US SOFT TISSUE HEAD/NECK
1 series · 14 of 25 positions shown · non-contrast
Comparison: Prior thyroid ultrasound 04/12/2010

CLINICAL DATA: 55-year-old male with a history of left-sided
thyroid cancer status post left thyroidectomy 3 years previously

EXAM:
THYROID ULTRASOUND
TECHNIQUE: Ultrasound examination of the thyroid gland and adjacent soft
tissues was performed.

[Series 1: us soft tissue head/neck · 0.07mm/px · 14 of 30 slices shown]
[im 1/30]
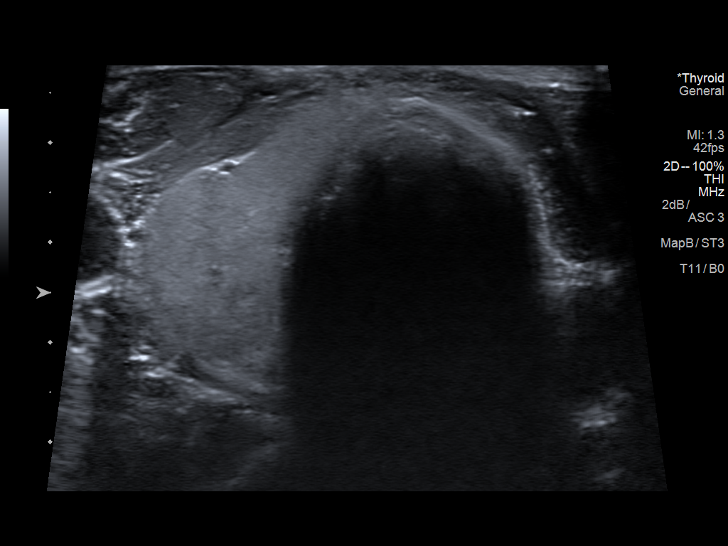
[im 3/30]
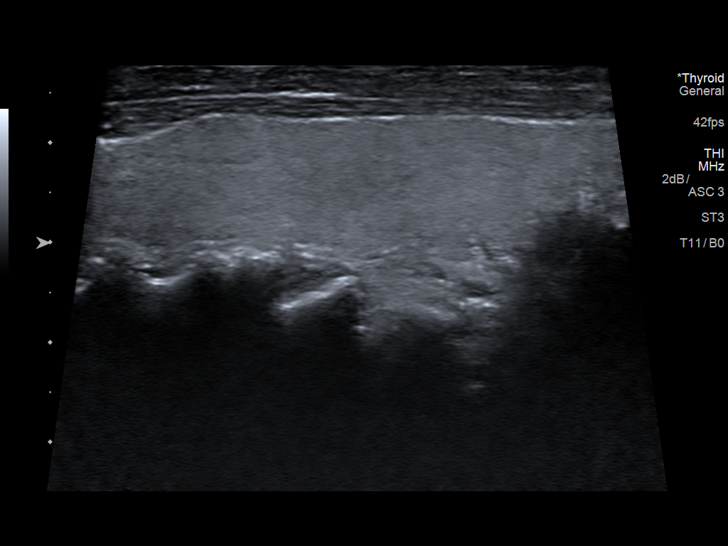
[im 5/30]
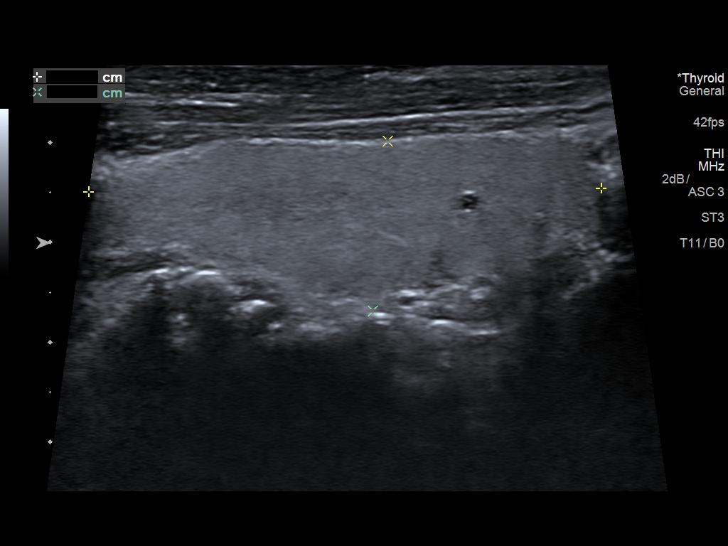
[im 8/30]
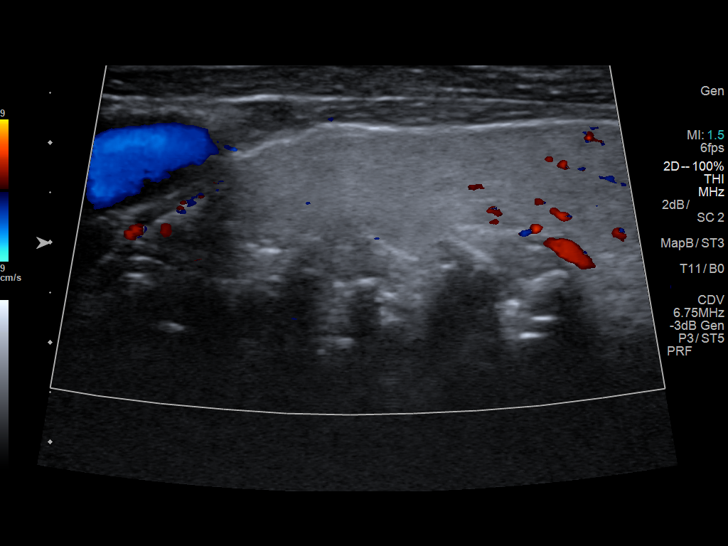
[im 10/30]
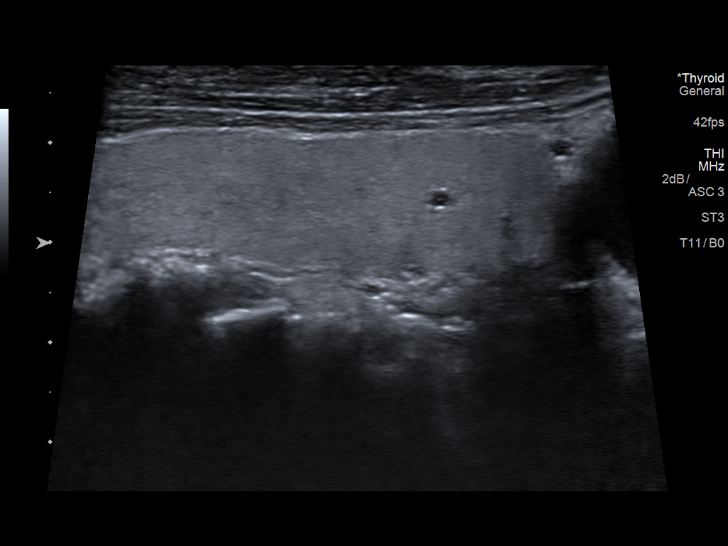
[im 11/30]
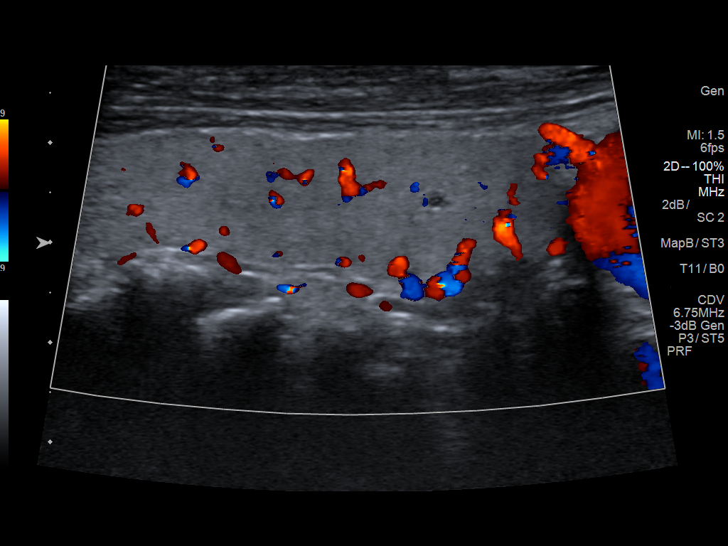
[im 14/30]
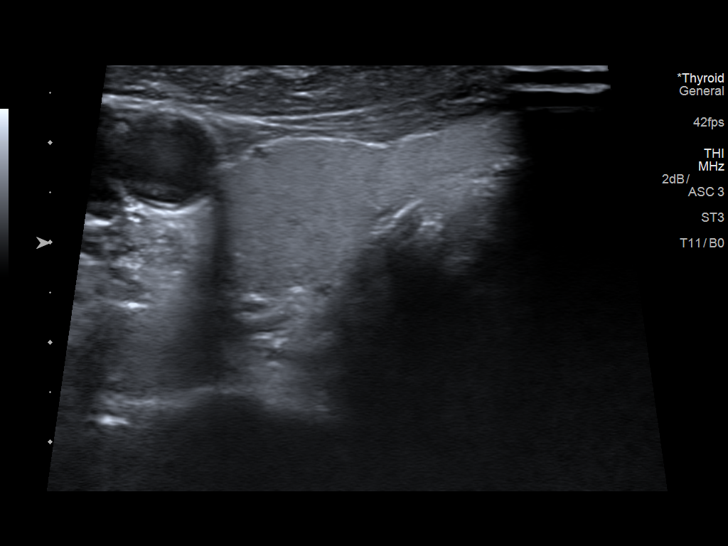
[im 16/30]
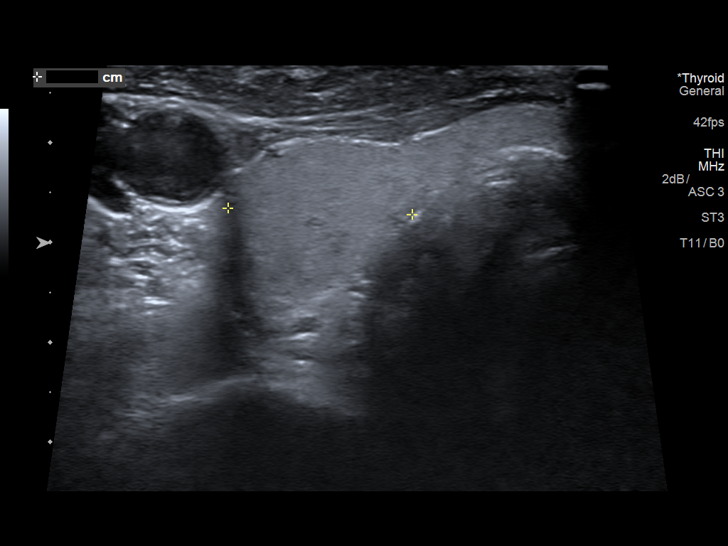
[im 19/30]
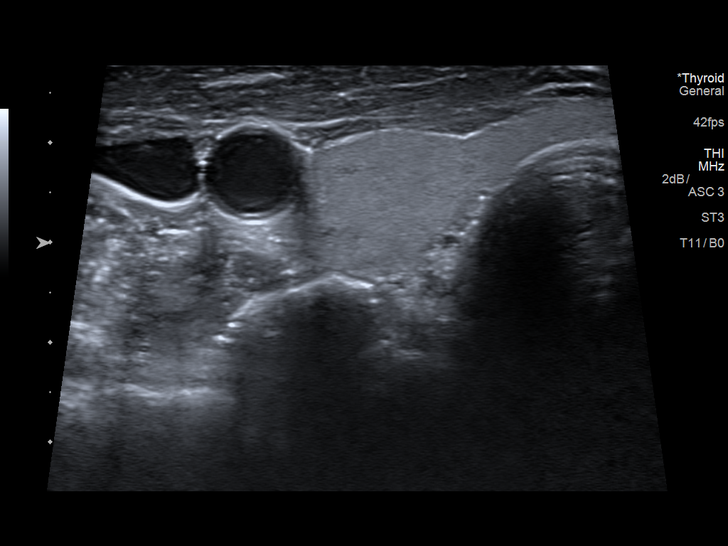
[im 20/30]
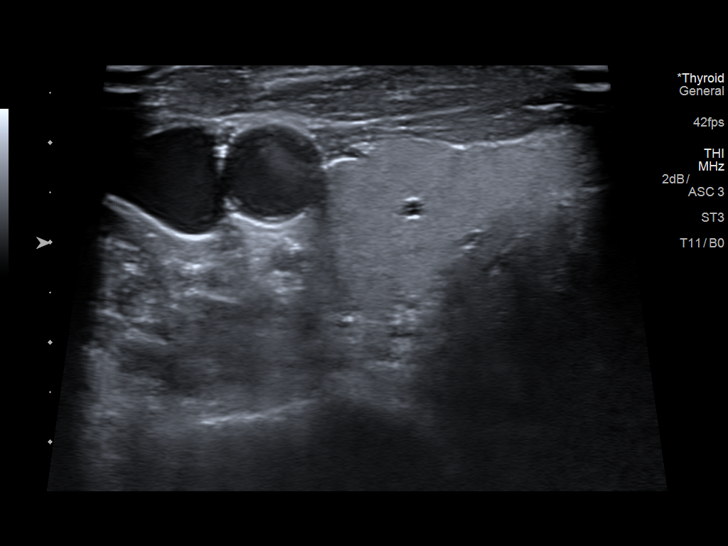
[im 22/30]
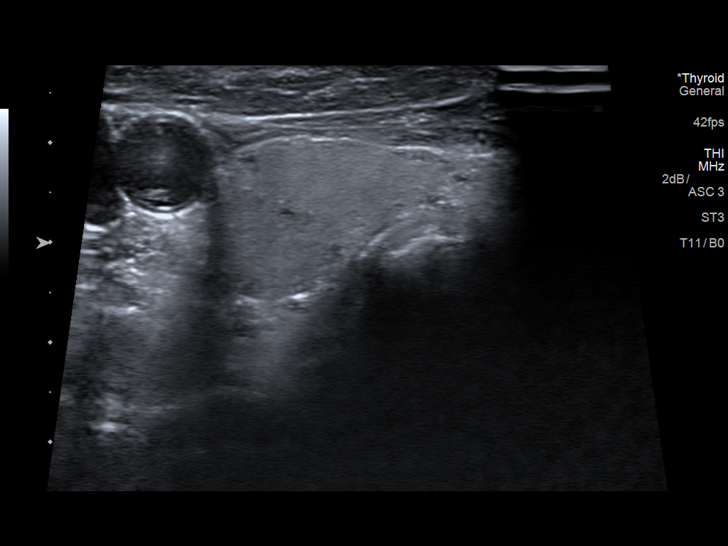
[im 25/30]
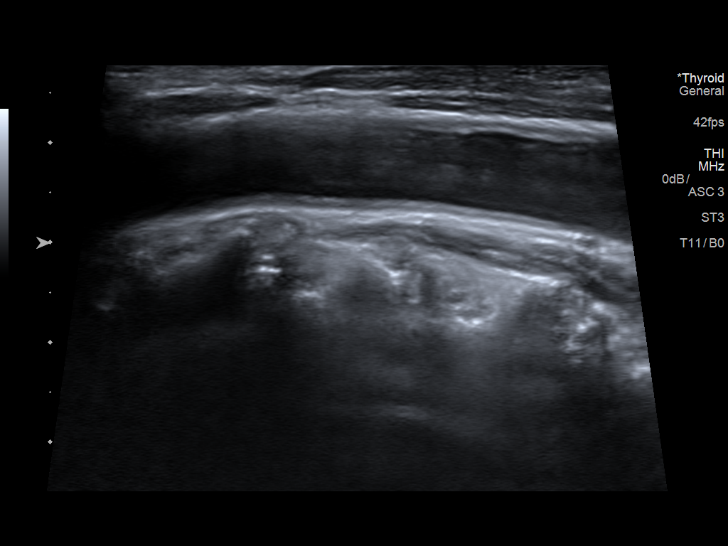
[im 27/30]
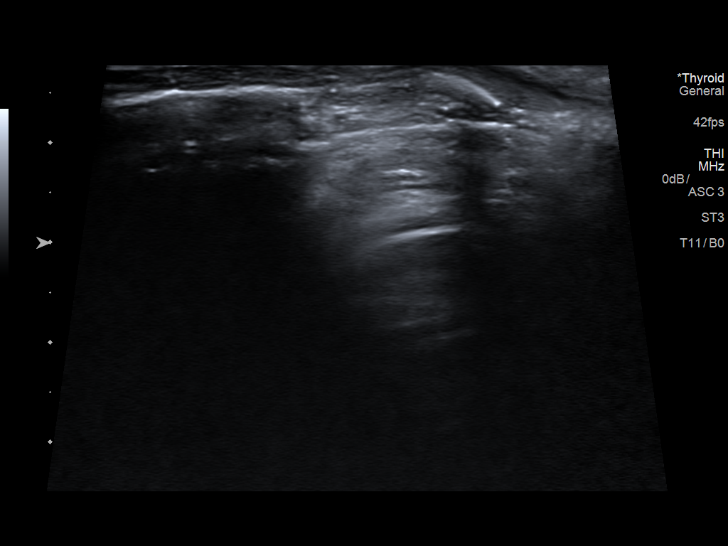
[im 30/30]
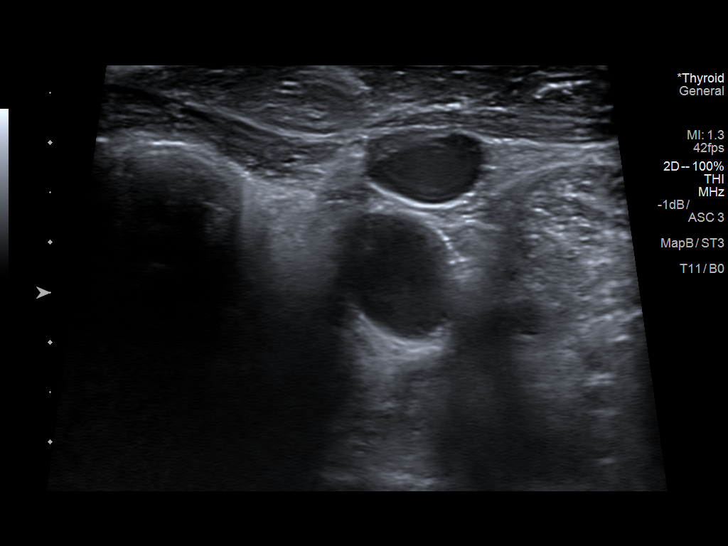

[14 of 25 positions shown; findings below may reference images not displayed]

FINDINGS: Right thyroid lobe

Measurements: 5.1 x 1.7 x 1.9 cm. Solitary tiny hypoechoic cyst in
the mid to lower gland measures 0.3 cm. No suspicious nodules.

Left thyroid lobe

Surgically absent. No suspicious residual thyroid tissue or
nodularity.

Lymphadenopathy

None visualized.
IMPRESSION: 1. Surgical changes of left hemithyroidectomy without evidence of
residual thyroid tissue or nodularity.
2. Essentially normal appearance of the right thyroid gland
containing a solitary 3 mm simple cyst.

## 2017-07-19 ENCOUNTER — Encounter (HOSPITAL_COMMUNITY): Payer: Self-pay | Admitting: Internal Medicine

## 2017-07-31 ENCOUNTER — Other Ambulatory Visit (HOSPITAL_COMMUNITY): Payer: Self-pay | Admitting: Internal Medicine

## 2017-08-11 ENCOUNTER — Other Ambulatory Visit (HOSPITAL_COMMUNITY): Payer: Self-pay | Admitting: Internal Medicine

## 2017-08-13 ENCOUNTER — Ambulatory Visit (INDEPENDENT_AMBULATORY_CARE_PROVIDER_SITE_OTHER): Payer: Medicaid Other | Admitting: *Deleted

## 2017-08-13 DIAGNOSIS — I5022 Chronic systolic (congestive) heart failure: Secondary | ICD-10-CM | POA: Diagnosis not present

## 2017-08-14 NOTE — Progress Notes (Signed)
Remote ICD transmission.   

## 2017-08-15 DIAGNOSIS — I259 Chronic ischemic heart disease, unspecified: Secondary | ICD-10-CM | POA: Diagnosis not present

## 2017-08-15 DIAGNOSIS — I10 Essential (primary) hypertension: Secondary | ICD-10-CM | POA: Diagnosis not present

## 2017-08-15 DIAGNOSIS — G4733 Obstructive sleep apnea (adult) (pediatric): Secondary | ICD-10-CM | POA: Diagnosis not present

## 2017-08-15 DIAGNOSIS — D649 Anemia, unspecified: Secondary | ICD-10-CM | POA: Diagnosis not present

## 2017-08-15 DIAGNOSIS — M109 Gout, unspecified: Secondary | ICD-10-CM | POA: Diagnosis not present

## 2017-08-23 LAB — CUP PACEART REMOTE DEVICE CHECK
Battery Remaining Longevity: 102 mo
Date Time Interrogation Session: 20180820074900
HighPow Impedance: 59 Ohm
Implantable Lead Implant Date: 20171114
Implantable Lead Location: 753860
Implantable Lead Model: 181
Lead Channel Pacing Threshold Amplitude: 0.6 V
MDC IDC LEAD SERIAL: 333496
MDC IDC MSMT BATTERY REMAINING PERCENTAGE: 100 %
MDC IDC MSMT LEADCHNL RV IMPEDANCE VALUE: 483 Ohm
MDC IDC MSMT LEADCHNL RV PACING THRESHOLD PULSEWIDTH: 0.4 ms
MDC IDC PG IMPLANT DT: 20131220
MDC IDC PG SERIAL: 124654
MDC IDC SET LEADCHNL RV PACING AMPLITUDE: 3.5 V
MDC IDC SET LEADCHNL RV PACING PULSEWIDTH: 0.4 ms
MDC IDC SET LEADCHNL RV SENSING SENSITIVITY: 0.5 mV
MDC IDC STAT BRADY RV PERCENT PACED: 0 %

## 2017-08-24 ENCOUNTER — Encounter: Payer: Self-pay | Admitting: Cardiology

## 2017-08-25 DIAGNOSIS — I10 Essential (primary) hypertension: Secondary | ICD-10-CM | POA: Diagnosis not present

## 2017-08-25 DIAGNOSIS — G4733 Obstructive sleep apnea (adult) (pediatric): Secondary | ICD-10-CM | POA: Diagnosis not present

## 2017-08-25 DIAGNOSIS — D649 Anemia, unspecified: Secondary | ICD-10-CM | POA: Diagnosis not present

## 2017-08-25 DIAGNOSIS — I259 Chronic ischemic heart disease, unspecified: Secondary | ICD-10-CM | POA: Diagnosis not present

## 2017-08-25 DIAGNOSIS — M109 Gout, unspecified: Secondary | ICD-10-CM | POA: Diagnosis not present

## 2017-08-31 ENCOUNTER — Other Ambulatory Visit (HOSPITAL_COMMUNITY): Payer: Self-pay | Admitting: Internal Medicine

## 2017-09-19 ENCOUNTER — Ambulatory Visit (HOSPITAL_COMMUNITY)
Admission: RE | Admit: 2017-09-19 | Discharge: 2017-09-19 | Disposition: A | Discharge status: Home / Self Care | Payer: Medicaid Other | Source: Ambulatory Visit | Attending: Internal Medicine | Admitting: Internal Medicine

## 2017-09-19 VITALS — BP 134/80 | HR 75 | Wt 178.8 lb

## 2017-09-19 DIAGNOSIS — G4730 Sleep apnea, unspecified: Secondary | ICD-10-CM | POA: Diagnosis not present

## 2017-09-19 DIAGNOSIS — Q600 Renal agenesis, unilateral: Secondary | ICD-10-CM | POA: Insufficient documentation

## 2017-09-19 DIAGNOSIS — K219 Gastro-esophageal reflux disease without esophagitis: Secondary | ICD-10-CM | POA: Insufficient documentation

## 2017-09-19 DIAGNOSIS — Z882 Allergy status to sulfonamides status: Secondary | ICD-10-CM | POA: Insufficient documentation

## 2017-09-19 DIAGNOSIS — Z95810 Presence of automatic (implantable) cardiac defibrillator: Secondary | ICD-10-CM | POA: Insufficient documentation

## 2017-09-19 DIAGNOSIS — Z79899 Other long term (current) drug therapy: Secondary | ICD-10-CM | POA: Diagnosis not present

## 2017-09-19 DIAGNOSIS — I130 Hypertensive heart and chronic kidney disease with heart failure and stage 1 through stage 4 chronic kidney disease, or unspecified chronic kidney disease: Secondary | ICD-10-CM | POA: Insufficient documentation

## 2017-09-19 DIAGNOSIS — I472 Ventricular tachycardia: Secondary | ICD-10-CM | POA: Diagnosis not present

## 2017-09-19 DIAGNOSIS — M109 Gout, unspecified: Secondary | ICD-10-CM | POA: Insufficient documentation

## 2017-09-19 DIAGNOSIS — Z951 Presence of aortocoronary bypass graft: Secondary | ICD-10-CM | POA: Diagnosis not present

## 2017-09-19 DIAGNOSIS — Z85850 Personal history of malignant neoplasm of thyroid: Secondary | ICD-10-CM | POA: Diagnosis not present

## 2017-09-19 DIAGNOSIS — N189 Chronic kidney disease, unspecified: Secondary | ICD-10-CM | POA: Diagnosis not present

## 2017-09-19 DIAGNOSIS — I255 Ischemic cardiomyopathy: Secondary | ICD-10-CM | POA: Insufficient documentation

## 2017-09-19 DIAGNOSIS — E785 Hyperlipidemia, unspecified: Secondary | ICD-10-CM | POA: Insufficient documentation

## 2017-09-19 DIAGNOSIS — Z7982 Long term (current) use of aspirin: Secondary | ICD-10-CM | POA: Insufficient documentation

## 2017-09-19 DIAGNOSIS — I252 Old myocardial infarction: Secondary | ICD-10-CM | POA: Insufficient documentation

## 2017-09-19 DIAGNOSIS — I4729 Other ventricular tachycardia: Secondary | ICD-10-CM

## 2017-09-19 DIAGNOSIS — J45909 Unspecified asthma, uncomplicated: Secondary | ICD-10-CM | POA: Insufficient documentation

## 2017-09-19 DIAGNOSIS — I5022 Chronic systolic (congestive) heart failure: Secondary | ICD-10-CM | POA: Insufficient documentation

## 2017-09-19 DIAGNOSIS — I2510 Atherosclerotic heart disease of native coronary artery without angina pectoris: Secondary | ICD-10-CM

## 2017-09-19 DIAGNOSIS — Z7902 Long term (current) use of antithrombotics/antiplatelets: Secondary | ICD-10-CM | POA: Insufficient documentation

## 2017-09-19 LAB — BASIC METABOLIC PANEL
ANION GAP: 9 (ref 5–15)
BUN: 13 mg/dL (ref 6–20)
CHLORIDE: 105 mmol/L (ref 101–111)
CO2: 24 mmol/L (ref 22–32)
CREATININE: 1.6 mg/dL — AB (ref 0.61–1.24)
Calcium: 8.8 mg/dL — ABNORMAL LOW (ref 8.9–10.3)
GFR calc non Af Amer: 47 mL/min — ABNORMAL LOW (ref >60)
GFR, EST AFRICAN AMERICAN: 55 mL/min — AB (ref >60)
Glucose, Bld: 121 mg/dL — ABNORMAL HIGH (ref 65–99)
POTASSIUM: 3.8 mmol/L (ref 3.5–5.1)
Sodium: 138 mmol/L (ref 135–145)

## 2017-09-19 LAB — CBC
HCT: 39.6 % (ref 39.0–52.0)
Hemoglobin: 13 g/dL (ref 13.0–17.0)
MCH: 27.4 pg (ref 26.0–34.0)
MCHC: 32.8 g/dL (ref 30.0–36.0)
MCV: 83.4 fL (ref 78.0–100.0)
Platelets: 138 K/uL — ABNORMAL LOW (ref 150–400)
RBC: 4.75 MIL/uL (ref 4.22–5.81)
RDW: 14.8 % (ref 11.5–15.5)
WBC: 3 K/uL — ABNORMAL LOW (ref 4.0–10.5)

## 2017-09-19 LAB — BRAIN NATRIURETIC PEPTIDE: B NATRIURETIC PEPTIDE 5: 834.8 pg/mL — AB (ref 0.0–100.0)

## 2017-09-19 NOTE — Patient Instructions (Signed)
Labs drawn today (if we do not call you, then your lab work was stable)   Your physician has recommended that you have a cardiopulmonary stress test (CPX). CPX testing is a non-invasive measurement of heart and lung function. It replaces a traditional treadmill stress test. This type of test provides a tremendous amount of information that relates not only to your present condition but also for future outcomes. This test combines measurements of you ventilation, respiratory gas exchange in the lungs, electrocardiogram (EKG), blood pressure and physical response before, during, and following an exercise protocol.  Your physician has requested that you have an echocardiogram. Echocardiography is a painless test that uses sound waves to create images of your heart. It provides your doctor with information about the size and shape of your heart and how well your heart's chambers and valves are working. This procedure takes approximately one hour. There are no restrictions for this procedure.  Your physician recommends that you schedule a follow-up appointment in: 4 months with Echocardiogram

## 2017-09-19 NOTE — Progress Notes (Signed)
Advanced Heart Failure Clinic Note   Patient ID: Chad [name], male   DOB: 1961/08/31, 56 y.o.   MRN: [id number]   History of Present Illness: Primary Cardiologist:  Dr. Demetrios Loll is a 56 y.o. male with history of severe HTN, coronary artery disease status post previous myocardial infarction and bypass surgery in 2006 also DES to native PDA in 2011.  He also has a history congestive heart failure secondary to ischemic cardiomyopathy EF 20-25%   He is s/p single chamber [employer] ICD.  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 Also with significant low back pain s/p 2 surgeries.   He has had episodes of CP but have avoided cath due to previous dissection and solitary kidney.  Myoview 11/2015  1. Evidence for very mild reversibility and ischemia involving the anterolateral wall and a portion of the lateral wall. 2. Large infarct involving the inferior wall. There is dyskinesia in the inferior wall. 3. Left ventricular ejection fraction is 32%. Left ventricle dilatation.   In 11/17 admitted for ICD shocks due to fractured RV lead. Underwent lead extraction and replacement.   We saw him in 2/18 for acute visit due to increasing dyspnea and volume overload. ECHO EF 20-25% moderate RV dysfunction. Moderate AI and severe MR. Ao Root 7mm-> 64mm.   In 4/18, he saw Dr. 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 He was referred to [facility] team for further evaluation in 6/18 and felt to be too early for transplant based on  CPX.  He returns for f/u. Says he is keeping his fluid levels in check and feeling much better. Taking lasix just as he needs it otherwise he gets way too dry. Takes 1-2 every few days. Watching fluid intake. Can do all activities without too much problem. Still with some back pain. No orthopnea, PND. No CP. SBP 120-130s.   ICD interrogated personally in clinic. No shocks. Occasional  NSVT.   CPX 4/18   FVC 3.11 (73%)    FEV1 2.40 (71%)     FEV1/FVC 77 (97%)     MVV 117 (77%)  Resting HR: 75 Peak HR: 134  (81% age predicted max HR) BP rest: 120/68 BP peak: 146/82 Peak VO2: 18.6 (55% predicted peak VO2) VE/VCO2 slope: 31 OUES: 2.01 Peak RER: 1.05 Ventilatory Threshold: 16.0 (47% predicted or measured peak VO2) VE/MVV: 49% O2pulse: 13  (76% predicted O2pulse)   3/18: ECHO EF 20-25% Moderate AI, moderate to severe MR, moderate TR. Ao Root 50cm 10/22/2015: ECHO EF 20-25% 11/26/12 ECHO EF 35-40%  Labs 11/25/13 Cholesterol 186 TG 121 HDL 49  Labs 12/09/13 K 3.6 creatinine 1.3  Labs 3/15 K 4.1, creatinine 1.4, BNP 589, HCT 39.8 Labs 3/16 K 3.8, creatinine 1.56, HCT 36.5 Labs 12/07/2015: K 3.6 Creatinine 1.37  Labs 05/01/2016: K 4.0 Creatinine 1.34   ROS: All pertinent positives and negatives as in HPI, otherwise negative.    Past Medical History:  Diagnosis Date  . AICD (automatic cardioverter/defibrillator) present   . Anemia   . Anginal pain ([place])   . Aortic  dissection, thoracoabdominal ([place])    7/10: Type I s/p repair  . Asthma   . CAD (coronary artery disease)    a. s/p CABG 2006;  b. DES to PDA 2011 (cath: Dx not seen, dRCA/PDA tx with DES; S-PDA occluded (culprit), S-Dx occluded, S-RI and OM ok, L-LAD ok  . Carotid stenosis    dopplers 2011: 0-39% bilat.  . Chest pain syndrome   . CHF (congestive heart failure) ([place])   . Chronic bronchitis ([place])   . Chronic lower back pain   . Chronic systolic heart failure (HCC)    a. 12/13 ECHO: EF 35-40%, sept,  apical & posterobasal HK, LV mod dil & sys fx mod reduced, mild AI, MV mild reg, TV mild reg  . Complication of anesthesia    "difficult to wake afterwards a couple of times"  . 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 Dyspnea   . Family history of adverse reaction to anesthesia    "sister hard to wake up"  . GERD (gastroesophageal reflux disease)   . Gout   . Heart murmur   . HLD (hyperlipidemia)   . HTN (hypertension)    severe  . Myocardial infarction [facility])    "many" (11/07/2016)  . PONV (postoperative nausea and vomiting)   . Sleep apnea    "never RX'd mask" (11/07/2016)  . 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 albuterol (PROVENTIL HFA;VENTOLIN HFA) 108 (90 Base) MCG/ACT inhaler Inhale 2 puffs into the lungs every 6 (six) hours as needed for wheezing or shortness of breath. 1 Inhaler 0  . aspirin 325 MG tablet Take 162.5 mg by mouth daily.     . carvedilol (COREG) 25 MG tablet Take 1 tablet (25 mg total) by mouth 2 (two) times daily with a meal. 60 tablet 6  . clopidogrel (PLAVIX) 75 MG tablet TAKE ONE TABLET BY MOUTH DAILY 90 tablet 3  . cyclobenzaprine (FLEXERIL) 10 MG tablet Take 10 mg by mouth daily as needed for muscle spasms.     [name] ENTRESTO 97-103 MG TAKE 1 TABLET BY MOUTH TWICE DAILY 60 tablet 3  . furosemide (LASIX) 40 MG tablet Take 1 tablet (40 mg total) by mouth 2 (two) times daily as needed for edema. (Patient taking differently: Take 40 mg by mouth 2 (two) times daily. ) 60 tablet 6  . hydrALAZINE (APRESOLINE) 50 MG tablet TAKE ONE & ONE-HALF TABLETS BY MOUTH THREE TIMES DAILY (Patient taking differently: Take 50 mg by mouth 2 (two) times daily. ) 405 tablet 3  . isosorbide mononitrate (IMDUR) 60 MG 24 hr tablet Take 1 tablet (60 mg total) by mouth 2 (two) times daily. 60 tablet 6  . KLOR-CON M10 10 MEQ tablet TAKE ONE TABLET BY MOUTH ONCE  DAILY 30 tablet 6  . levofloxacin (LEVAQUIN) 500 MG tablet Take 1 tablet (500 mg total) by mouth daily. 14 tablet 0  . naphazoline (NAPHCON) 0.1 % ophthalmic solution Place 1 drop into both eyes 4 (four) times daily as needed for irritation.    . nitroGLYCERIN (NITROSTAT) 0.4 MG SL tablet PLACE ONE TABLET UNDER THE TONGUE EVERY 5 MINUTES FOR 3 DOSES AS NEEDED FOR CHEST PAIN 25 tablet 12  . Oxycodone HCl 10 MG TABS Take 20 mg by mouth 3 (three) times daily.     . sacubitril-valsartan (ENTRESTO) 49-51 MG Take 1  tablet by mouth 2 (two) times daily.    . sildenafil (VIAGRA) 100 MG tablet Take 0.5 tablets (50 mg total) by mouth daily as needed for erectile dysfunction. (Patient not taking: Reported on 06/01/2017) 10 tablet 0  . Spacer/Aero-Holding Chambers (AEROCHAMBER PLUS) inhaler Use as instructed 1 each 0  . spironolactone (ALDACTONE) 25 MG tablet Take 0.5 tablets (12.5 mg total) by mouth daily. (Patient taking differently: Take 12.5 mg by mouth at bedtime. ) 30 tablet 6   No current facility-administered medications for this encounter.     Allergies  Allergen Reactions  . Iohexol Anaphylaxis and Other (See Comments)     Desc: PT HAS ANAPHYLAXIS WITH CONTRAST MEDIA!  . Lipitor [Atorvastatin Calcium] Anaphylaxis and Other (See Comments)    Large doses  . Shellfish Allergy Anaphylaxis  . Sulfa Antibiotics Shortness Of Breath and Swelling  . Sulfonamide Derivatives Shortness Of Breath and Swelling  . Latex Rash    With long periods of exposure  . Zocor [Simvastatin] Other (See Comments)    Muscle cramps    Vital Signs: Vitals:   09/19/17 0927  BP: 134/80  Pulse: 75  SpO2: 96%  Weight: 178 lb 12 oz (81.1 kg)   Filed Weights   09/19/17 0927  Weight: 178 lb 12 oz (81.1 kg)   Physical Exam: General:  Well appearing. No resp difficulty HEENT: normal anincteric Neck: supple. JVP 6-7 Carotids 2+ bilaterally carotid bruits L>R No lymphadenopathy or thryomegaly. Cor: PMI nondisplaced.  RRR 2/6 TR. Soft AI murmur Lungs: clear Abdomen: soft NT/ND  No hepatosplenomegaly. No bruits. Good BS Extremities: no cyanosis, clubbing, rash, edema Neuro: alert & oriented x 3, cranial nerves grossly intact. moves all 4 extremities w/o difficulty. Affect pleasant   ASSESSMENT/Plan  1. Chronic systolic HF: Ischemic cardiomyopathy.  Echo (2/16) with EF 25%. S/p [employer] ICD. Echo 3/18. EF 20-25% - Stable NYHA I-II. Volume status looks good. Continue sliding scale lasix - Discussed [facility] visit with him. Too early for transplant consideration at this time. Will repeat CPX 4-6 months. He knows to call me with clinical deterioration  - Continue Entresto 97/103 bid - Continue carvedilol 25 mg twice a day.  - Continue hydralazine 75 mg bid/ Imdur 60 bid.  - Continue spironolactone 12.5 mg daily.  - He has seen w Dr. Prescott Gum. Given previous dissection and aortic root replacement likely not candidate for VAD.  2. CAD: s/p CABG.   - No recent CP.  -Cardiolite 11/2015 with evidence for mild reversibility involving the anterolateral wall and a portion of the lateral wall. 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   - Statin intolerance at any dose.  - Continue Zetia. - PCP follows lipids. If LDL remains elevated, will consider referral to Lipid Clinic to consider PCSK-9 3. CKD in setting of solitary kidney s/p dissection -recheck today 4. HTN: - stable. Well controlled on current regimen 5. H/o Type I aortic dissection: Follows with CVTS.  - He has seen w Dr. Prescott Gum. Given previous dissection and aortic root replacement likely not candidate for VAD. - Repeat echo next visit 6. NSVT - ICD interrogated personally. Intermittent episodes on ICD. Check electrolytes.    Ellenor Wisniewski,MD 9:29 AM

## 2017-09-20 ENCOUNTER — Other Ambulatory Visit (HOSPITAL_COMMUNITY): Payer: Self-pay | Admitting: Adult Health

## 2017-09-24 DIAGNOSIS — M109 Gout, unspecified: Secondary | ICD-10-CM | POA: Diagnosis not present

## 2017-09-24 DIAGNOSIS — G4733 Obstructive sleep apnea (adult) (pediatric): Secondary | ICD-10-CM | POA: Diagnosis not present

## 2017-09-24 DIAGNOSIS — I10 Essential (primary) hypertension: Secondary | ICD-10-CM | POA: Diagnosis not present

## 2017-09-24 DIAGNOSIS — I259 Chronic ischemic heart disease, unspecified: Secondary | ICD-10-CM | POA: Diagnosis not present

## 2017-09-24 DIAGNOSIS — D649 Anemia, unspecified: Secondary | ICD-10-CM | POA: Diagnosis not present

## 2017-09-28 ENCOUNTER — Other Ambulatory Visit: Payer: Self-pay | Admitting: Internal Medicine

## 2017-10-11 ENCOUNTER — Encounter (HOSPITAL_COMMUNITY): Payer: Self-pay | Admitting: Emergency Medicine

## 2017-10-11 ENCOUNTER — Emergency Department (HOSPITAL_COMMUNITY)
Admission: EM | Admit: 2017-10-11 | Discharge: 2017-10-11 | Disposition: A | Discharge status: Home / Self Care | Payer: Medicaid Other | Attending: Emergency Medicine | Admitting: Emergency Medicine

## 2017-10-11 ENCOUNTER — Emergency Department (HOSPITAL_COMMUNITY): Payer: Medicaid Other

## 2017-10-11 ENCOUNTER — Other Ambulatory Visit (HOSPITAL_COMMUNITY): Payer: Medicaid Other

## 2017-10-11 DIAGNOSIS — I2510 Atherosclerotic heart disease of native coronary artery without angina pectoris: Secondary | ICD-10-CM | POA: Insufficient documentation

## 2017-10-11 DIAGNOSIS — Z79899 Other long term (current) drug therapy: Secondary | ICD-10-CM | POA: Diagnosis not present

## 2017-10-11 DIAGNOSIS — I252 Old myocardial infarction: Secondary | ICD-10-CM | POA: Insufficient documentation

## 2017-10-11 DIAGNOSIS — Z9861 Coronary angioplasty status: Secondary | ICD-10-CM | POA: Diagnosis not present

## 2017-10-11 DIAGNOSIS — I7100 Dissection of unspecified site of aorta: Secondary | ICD-10-CM

## 2017-10-11 DIAGNOSIS — R079 Chest pain, unspecified: Secondary | ICD-10-CM

## 2017-10-11 DIAGNOSIS — Z85850 Personal history of malignant neoplasm of thyroid: Secondary | ICD-10-CM | POA: Insufficient documentation

## 2017-10-11 DIAGNOSIS — R072 Precordial pain: Secondary | ICD-10-CM | POA: Diagnosis not present

## 2017-10-11 DIAGNOSIS — I10 Essential (primary) hypertension: Secondary | ICD-10-CM

## 2017-10-11 DIAGNOSIS — I5022 Chronic systolic (congestive) heart failure: Secondary | ICD-10-CM | POA: Insufficient documentation

## 2017-10-11 DIAGNOSIS — N183 Chronic kidney disease, stage 3 (moderate): Secondary | ICD-10-CM | POA: Diagnosis not present

## 2017-10-11 DIAGNOSIS — Z7902 Long term (current) use of antithrombotics/antiplatelets: Secondary | ICD-10-CM | POA: Insufficient documentation

## 2017-10-11 DIAGNOSIS — Z91040 Latex allergy status: Secondary | ICD-10-CM | POA: Insufficient documentation

## 2017-10-11 DIAGNOSIS — J45909 Unspecified asthma, uncomplicated: Secondary | ICD-10-CM | POA: Insufficient documentation

## 2017-10-11 DIAGNOSIS — I130 Hypertensive heart and chronic kidney disease with heart failure and stage 1 through stage 4 chronic kidney disease, or unspecified chronic kidney disease: Secondary | ICD-10-CM | POA: Diagnosis not present

## 2017-10-11 LAB — BASIC METABOLIC PANEL
ANION GAP: 10 (ref 5–15)
BUN: 12 mg/dL (ref 6–20)
CALCIUM: 8.5 mg/dL — AB (ref 8.9–10.3)
CO2: 23 mmol/L (ref 22–32)
CREATININE: 1.3 mg/dL — AB (ref 0.61–1.24)
Chloride: 103 mmol/L (ref 101–111)
GFR calc non Af Amer: >60 mL/min (ref >60)
GLUCOSE: 125 mg/dL — AB (ref 65–99)
POTASSIUM: 3.5 mmol/L (ref 3.5–5.1)
Sodium: 136 mmol/L (ref 135–145)

## 2017-10-11 LAB — I-STAT TROPONIN, ED: TROPONIN I, POC: 0 ng/mL (ref 0.00–0.08)

## 2017-10-11 LAB — CBC
HCT: 41.9 % (ref 39.0–52.0)
Hemoglobin: 14.3 g/dL (ref 13.0–17.0)
MCH: 28.4 pg (ref 26.0–34.0)
MCHC: 34.1 g/dL (ref 30.0–36.0)
MCV: 83.1 fL (ref 78.0–100.0)
PLATELETS: 104 10*3/uL — AB (ref 150–400)
RBC: 5.04 MIL/uL (ref 4.22–5.81)
RDW: 14.6 % (ref 11.5–15.5)
WBC: 4.6 10*3/uL (ref 4.0–10.5)

## 2017-10-11 MED ORDER — ONDANSETRON HCL 4 MG/2ML IJ SOLN
4.0000 mg | Freq: Once | INTRAMUSCULAR | Status: AC
  Administered 2017-10-11: 4 mg via INTRAVENOUS
  Filled 2017-10-11: qty 2

## 2017-10-11 MED ORDER — MORPHINE SULFATE (PF) 4 MG/ML IV SOLN
4.0000 mg | Freq: Once | INTRAVENOUS | Status: AC
  Administered 2017-10-11: 4 mg via INTRAVENOUS
  Filled 2017-10-11: qty 1

## 2017-10-11 MED ORDER — SODIUM CHLORIDE 0.9 % IV BOLUS (SEPSIS)
500.0000 mL | Freq: Once | INTRAVENOUS | Status: AC
  Administered 2017-10-11: 500 mL via INTRAVENOUS

## 2017-10-11 MED ORDER — ISOSORBIDE MONONITRATE ER 30 MG PO TB24
60.0000 mg | ORAL_TABLET | Freq: Every day | ORAL | Status: DC
  Administered 2017-10-11: 60 mg via ORAL
  Filled 2017-10-11: qty 2

## 2017-10-11 MED ORDER — PREDNISONE 20 MG PO TABS
50.0000 mg | ORAL_TABLET | Freq: Once | ORAL | Status: AC
  Administered 2017-10-11: 50 mg via ORAL
  Filled 2017-10-11: qty 3

## 2017-10-11 NOTE — Discharge Instructions (Signed)
Follow up with your cardiologist  

## 2017-10-11 NOTE — ED Provider Notes (Signed)
[facility] EMERGENCY DEPARTMENT Provider Note   CSN: [id number] Arrival date & time: 10/11/17  2952     History   Chief Complaint Chief Complaint  Patient presents with  . Chest Pain    HPI Chad [name] is a 56 y.o. male.  Level V caveat for acuity and urgent need for intervention. Patient presents with chest pain since 5 AM today without dyspnea, diaphoresis. Positive nausea. Past medical history is very complicated and includes hypertension, CAD, MI, CABG 2006, CHF, ischemic cardiomyopathy, ICD implantation, July 2010 type I aortic dissection down to bilateral iliacs with subsequent repair.  He states pain feels similar to the pain he had years ago.      Past Medical History:  Diagnosis Date  . AICD (automatic cardioverter/defibrillator) present   . Anemia   . Anginal pain ([place])   . Aortic dissection, thoracoabdominal ([place])    7/10: Type I s/p repair  . Asthma   . CAD (coronary artery disease)    a. s/p CABG 2006;  b. DES to PDA 2011 (cath: Dx not seen, dRCA/PDA tx with DES; S-PDA occluded (culprit), S-Dx occluded, S-RI and OM ok, L-LAD ok  . Carotid stenosis    dopplers 2011: 0-39% bilat.  . Chest pain syndrome   . CHF (congestive heart failure) ([place])   . Chronic bronchitis ([place])   . Chronic lower back pain   . Chronic systolic heart failure (HCC)    a. 12/13 ECHO: EF 35-40%, sept, apical & posterobasal HK, LV mod dil & sys fx mod reduced, mild AI, MV mild reg, TV mild reg  . Complication of anesthesia    "difficult to wake afterwards a couple of times"  . 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 Dyspnea   . Family history of adverse reaction to anesthesia    "sister hard to wake up"  . GERD (gastroesophageal reflux disease)   . Gout   . Heart murmur   . HLD (hyperlipidemia)   . HTN (hypertension)    severe  . Myocardial infarction [facility])    "many" (11/07/2016)  . PONV  (postoperative nausea and vomiting)   . Sleep apnea    "never RX'd mask" (11/07/2016)  . Thyroid cancer ([place])    [employer]    Patient Active Problem List   Diagnosis Date Noted  . Failure of implantable cardioverter-defibrillator (ICD) lead 11/07/2016  . Dyspnea 05/12/2016  . Erectile dysfunction 04/18/2016  . Pain in the chest   . Aneurysm of descending thoracic aorta ([place])   . Nasal folliculitis 84/13/2440  . Chronic systolic CHF (congestive heart failure) ([place]) 04/12/2015  . CKD (chronic kidney disease) stage 3, GFR 30-59 ml/min (HCC) 04/12/2015  . Unstable angina ([place]) 01/25/2015  . Health care maintenance 03/19/2014  . Local reaction to tetanus vaccine 03/19/2014  . Neck pain on left side 06/03/2013  . Penile discharge 02/26/2013  . Syncope 01/17/2013  . Acute on chronic systolic and diastolic heart failure, NYHA class 3 ([place]) 01/16/2013  . Low back pain 04/23/2012  . Chest pain syndrome   . Insomnia 09/14/2011  . 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 Coronary atherosclerosis of native coronary artery 06/12/2011  . Bradycardia 05/03/2011  . Aortic dissection, thoracoabdominal ([place])   . Chronic kidney disease (CKD), stage II (mild)   . ORTHOSTATIC HYPOTENSION 05/03/2010  . CAROTID BRUIT 01/25/2010  . PANCYTOPENIA 11/01/2009  . Fatigue  10/25/2009  . DISSECTING AORTIC ANEURYSM THORACOABDOMINAL 07/14/2009  . SCIATICA, RIGHT 05/18/2009  . HYPERLIPIDEMIA-MIXED 09/29/2008  . HYPERTENSION, BENIGN 09/29/2008  . CAD, ARTERY BYPASS GRAFT 09/29/2008  . SYSTOLIC HEART FAILURE, CHRONIC 09/29/2008  . ICD - IN SITU 09/29/2008    Past Surgical History:  Procedure Laterality Date  . BACK SURGERY    . CARDIAC CATHETERIZATION     "several" (11/07/2016)  . CARDIAC DEFIBRILLATOR PLACEMENT  11/2005   [employer]; Archie Endo 05/09/2011  . CORONARY ANGIOPLASTY WITH STENT PLACEMENT     "I've had 1-2 stents" (11/07/2016)  . CORONARY ARTERY BYPASS  GRAFT  06/21/2009   "CABG X2"  . CORONARY ARTERY BYPASS GRAFT  06/29/2005   "CABG X7"  . ICD LEAD REMOVAL  11/07/2016  . ICD LEAD REMOVAL N/A 11/07/2016   Procedure: ICD LEAD REMOVAL, INSERTION OF NEW ICD LEAD;  Surgeon: Evans Lance, MD;  Location: [facility];  Service: Cardiovascular;  Laterality: N/A;  Dr. Prescott Gum to backup case  . IMPLANTABLE CARDIOVERTER DEFIBRILLATOR (ICD) GENERATOR CHANGE N/A 12/13/2012   Procedure: ICD GENERATOR CHANGE;  Surgeon: Evans Lance, MD;  Location: [facility] CATH LAB;  Service: Cardiovascular;  Laterality: N/A;  . LUMBAR [place] SURGERY  01/2001    most recent within 5-10 years  . SHOULDER ARTHROSCOPY WITH ROTATOR CUFF REPAIR Bilateral   . Status post emergency repair of a type A ascending aortic dissection with a hemiarch reconstruction of the ascending aorta  using a 28-mm Hemashield graft with redo sternotomy and revision of previous bypass grafts in June 2010.    . TESTICLE SURGERY    . THYROIDECTOMY, PARTIAL  06/20/2011  . VASECTOMY         Home Medications    Prior to Admission medications   Medication Sig Start Date End Date Taking? Authorizing Provider  albuterol (PROVENTIL HFA;VENTOLIN HFA) 108 (90 Base) MCG/ACT inhaler Inhale 2 puffs into the lungs every 6 (six) hours as needed for wheezing or shortness of breath. 05/12/16  Yes Olam Idler, MD  carvedilol (COREG) 25 MG tablet Take 1 tablet (25 mg total) by mouth 2 (two) times daily with a meal. 01/31/17  Yes Bensimhon, Shaune Pascal, MD  clopidogrel (PLAVIX) 75 MG tablet TAKE ONE TABLET BY MOUTH DAILY 08/01/17  Yes Bensimhon, Shaune Pascal, MD  cyclobenzaprine (FLEXERIL) 10 MG tablet Take 10 mg by mouth daily as needed for muscle spasms.    Yes [provider]  diphenhydrAMINE (BENADRYL) 25 MG tablet Take 25 mg by mouth every 6 (six) hours as needed for allergies.   Yes [provider]  furosemide (LASIX) 40 MG tablet Take 1 tablet (40 mg total) by mouth 2 (two) times daily as needed for  edema. Patient taking differently: Take 40 mg by mouth 2 (two) times daily as needed for fluid.  01/31/17  Yes Bensimhon, Shaune Pascal, MD  hydrALAZINE (APRESOLINE) 50 MG tablet Take 75 mg by mouth 2 (two) times daily.   Yes [provider]  ibuprofen (ADVIL,MOTRIN) 200 MG tablet Take 200 mg by mouth every 6 (six) hours as needed for moderate pain.   Yes [provider]  isosorbide mononitrate (IMDUR) 60 MG 24 hr tablet Take 1 tablet (60 mg total) by mouth 2 (two) times daily. 01/31/17  Yes Bensimhon, Shaune Pascal, MD  KLOR-CON M10 10 MEQ tablet TAKE ONE TABLET BY MOUTH ONCE DAILY Patient taking differently: TAKE ONE TABLET BY MOUTH ONCE DAILY AS NEEDED FOR SUPPLEMENT 09/04/17  Yes Bensimhon, Shaune Pascal, MD  naphazoline (NAPHCON) 0.1 % ophthalmic solution Place 1 drop into both eyes 4 (four) times daily as needed for irritation.   Yes [provider]  nitroGLYCERIN (NITROSTAT) 0.4 MG SL tablet PLACE ONE TABLET UNDER THE TONGUE EVERY 5 MINUTES FOR 3 DOSES AS NEEDED FOR CHEST PAIN 07/02/17  Yes Bensimhon, Shaune Pascal, MD  Oxycodone HCl 10 MG TABS Take 20 mg by mouth 3 (three) times daily.    Yes [provider]  sacubitril-valsartan (ENTRESTO) 97-103 MG Take 1 tablet by mouth 2 (two) times daily.   Yes [provider]  sildenafil (VIAGRA) 100 MG tablet TAKE 1/2 TABLET BY MOUTH DAILY AS NEEDED FOR ERECTILE DYSFUNCTION 09/24/17  Yes Bensimhon, Shaune Pascal, MD  Spacer/Aero-Holding Chambers (AEROCHAMBER PLUS) inhaler Use as instructed 05/12/16  Yes Olam Idler, MD  spironolactone (ALDACTONE) 25 MG tablet Take 0.5 tablets (12.5 mg total) by mouth daily. Patient taking differently: Take 12.5 mg by mouth at bedtime.  01/31/17  Yes Bensimhon, Shaune Pascal, MD    Family History Family History  Problem Relation Age of Onset  . Hypertension Father   . Heart disease Father   . Early death Father   . COPD Father   . Hypertension Mother   . Coronary artery disease Unknown     Social  History Social History  Substance Use Topics  . Smoking status: Never Smoker  . Smokeless tobacco: Never Used  . Alcohol use No     Allergies   Iohexol; Lipitor [atorvastatin calcium]; Shellfish allergy; Sulfa antibiotics; Sulfonamide derivatives; Metrizamide; Latex; and Zocor [simvastatin]   Review of Systems Review of Systems  Unable to perform ROS: Acuity of condition     Physical Exam Updated Vital Signs BP 95/67 (BP Location: Right Arm)   Pulse 63   Temp 97.9 F (36.6 C) (Oral)   Resp 18   SpO2 96%   Physical Exam  Constitutional: He is oriented to person, place, and time. He appears well-developed and well-nourished.  HENT:  Head: Normocephalic and atraumatic.  Eyes: Conjunctivae are normal.  Neck: Neck supple.  Cardiovascular: Normal rate and regular rhythm.   Pulmonary/Chest: Effort normal and breath sounds normal.  Abdominal: Soft. Bowel sounds are normal.  Musculoskeletal: Normal range of motion.  Neurological: He is alert and oriented to person, place, and time.  Skin: Skin is warm and dry.  Psychiatric: He has a normal mood and affect. His behavior is normal.  Nursing note and vitals reviewed.    ED Treatments / Results  Labs (all labs ordered are listed, but only abnormal results are displayed) Labs Reviewed  BASIC METABOLIC PANEL - Abnormal; Notable for the following:       Result Value   Glucose, Bld 125 (*)    Creatinine, Ser 1.30 (*)    Calcium 8.5 (*)    All other components within normal limits  CBC - Abnormal; Notable for the following:    Platelets 104 (*)    All other components within normal limits  I-STAT TROPONIN, ED    EKG  EKG Interpretation  Date/Time:  Thursday October 11 2017 09:26:34 EDT Ventricular Rate:  72 PR Interval:  188 QRS Duration: 112 QT Interval:  408 QTC Calculation: 446 R Axis:   27 Text Interpretation:  Normal sinus rhythm Possible Left atrial enlargement Incomplete left bundle branch block T wave  abnormality, consider inferior ischemia Abnormal ECG Confirmed by Nat Christen [id number]) on 10/11/2017 10:52:28 AM       Radiology Dg Chest 2  View  Result Date: 10/11/2017 CLINICAL DATA:  Chest pain EXAM: CHEST  2 VIEW COMPARISON:  06/01/2017 chest radiograph. FINDINGS: Stable configuration of sternotomy wires and single lead left subclavian ICD. Stable cardiomediastinal silhouette with mild cardiomegaly and prominent tortuous aortic arch. No pneumothorax. No pleural effusion. Cephalization of the pulmonary vasculature without overt pulmonary edema. No acute consolidative airspace disease. IMPRESSION: Stable mild cardiomegaly without overt pulmonary edema. No active pulmonary disease. Electronically Signed   By: Ilona Sorrel M.D.   On: 10/11/2017 10:09   Ct Chest Wo Contrast  Result Date: 10/11/2017 CLINICAL DATA:  Chest pain. EXAM: CT CHEST WITHOUT CONTRAST TECHNIQUE: Multidetector CT imaging of the chest was performed following the standard protocol without IV contrast. COMPARISON:  Radiographs of same day.  CT scan of June 01, 2017. FINDINGS: Cardiovascular: Atherosclerosis of thoracic aorta is noted. Status post surgical repair of ascending thoracic aorta. Status post coronary artery bypass graft. 6 cm proximal descending thoracic aortic aneurysm is noted which is not significantly changed based on my own measurement of same area on prior exam. Due to the lack of intravenous contrast, dissection cannot be excluded. Left-sided pacemaker is unchanged in position. Aortic root is dilated at 4.2 cm. 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 Mild cardiomegaly is noted. No pericardial effusion is noted. Mediastinum/Nodes: Stable calcified pretracheal and right hilar lymph nodes are noted most consistent with granulomatous disease. Thyroid gland is unremarkable. Esophagus is unremarkable. Lungs/Pleura: No pneumothorax or pleural effusion is noted. Stable calcified  granuloma is noted in right upper lobe. Stable left basilar scarring is noted. Upper Abdomen: Mild cholelithiasis is noted. Severe left renal atrophy is noted. Musculoskeletal: No chest wall mass or suspicious bone lesions identified. IMPRESSION: Status post surgical repair of ascending thoracic aorta. 6 cm proximal descending thoracic aortic aneurysm is noted which is not significantly changed compared to prior exam. Dissection cannot be excluded due the lack of intravenous contrast. Stable aortic root dilatation is noted at 4.2 cm. Stable calcified mediastinal adenopathy most consistent granulomatous disease. Mild cholelithiasis. Severe left renal atrophy. Aortic Atherosclerosis (ICD10-I70.0). Electronically Signed   By: Marijo Conception, M.D.   On: 10/11/2017 12:31    Procedures Procedures (including critical care time)  Medications Ordered in ED Medications  ondansetron (ZOFRAN) injection 4 mg (4 mg Intravenous Given 10/11/17 1027)  sodium chloride 0.9 % bolus 500 mL (0 mLs Intravenous Stopped 10/11/17 1104)  morphine 4 MG/ML injection 4 mg (4 mg Intravenous Given 10/11/17 1027)  predniSONE (DELTASONE) tablet 50 mg (50 mg Oral Given 10/11/17 1235)     Initial Impression / Assessment and Plan / ED Course  I have reviewed the triage vital signs and the nursing notes.  Pertinent labs & imaging results that were available during my care of the patient were reviewed by me and considered in my medical decision making (see chart for details).    CRITICAL CARE Performed by: Nat Christen Total critical care time: 30 minutes Critical care time was exclusive of separately billable procedures and treating other patients. Critical care was necessary to treat or prevent imminent or life-threatening deterioration. Critical care was time spent personally by me on the following activities: development of treatment plan with patient and/or surrogate as well as nursing, discussions with consultants,  evaluation of patient's response to treatment, examination of patient, obtaining history from patient or surrogate, ordering and performing treatments and interventions, ordering and review of laboratory studies, ordering and review of radiographic studies, pulse oximetry  and re-evaluation of patient's condition.   Complex patient presents with chest pain.  His medical record was reviewed and he was noted to have a type I aortic dissection in 2010 with subsequent repair. He has a severe contrast allergy and therefore could not get an acute scan with contrast. Additionally he has an ICD which precludes an MRI.  Discussed care with Dr. Bridgett Larsson.  He stated that a CT with contrast was necessary to further assess this patient. Will initiate the premedication protocol for severe allergies to contrast dye. Admit to general medicine.  Final Clinical Impressions(s) / ED Diagnoses   Final diagnoses:  Chest pain, unspecified type    New Prescriptions New Prescriptions   No medications on file     Nat Christen, MD 10/11/17 1416

## 2017-10-11 NOTE — ED Notes (Signed)
Transported to xray 

## 2017-10-11 NOTE — Consult Note (Signed)
    Pt was discharge by ED before attending could see patient.  Reportedly, patient is a chronic patient of Cardiothoracic Surgery.   Adele Barthel, MD, FACS [facility] Office: [phone] Pager: [phone]  10/11/2017, 4:45 PM

## 2017-10-11 NOTE — ED Notes (Signed)
Pt resting comfortably. Fluid bolus infusing. Pt states that his pain is now 3/10. No other complaints. Waiting on further testing.

## 2017-10-11 NOTE — ED Notes (Signed)
Pt ambulated at his baseline per pt. Denies dizziness or pain. Stable gait noted. RN notified.

## 2017-10-11 NOTE — ED Notes (Signed)
Pt resting comfortably. Call bell within reach. No complaints at this time. Family at the bedside.

## 2017-10-11 NOTE — ED Triage Notes (Signed)
Pt arrives from work c/o  L sided CP radiating to back and LUE starting this morning, denies SOB, n/v, dizziness.  Pt reports hx MI, CABG x2, reports taking 2 NTG PTA with no relief.

## 2017-10-11 NOTE — ED Notes (Signed)
Vascular pa at the bedside to eval patient.

## 2017-10-11 NOTE — ED Notes (Signed)
Pt stable, ambulatory, states understanding of discharge instructions 

## 2017-10-11 NOTE — Consult Note (Signed)
Advanced Heart Failure Team Consult Note   Primary Physician: Primary Cardiologist: Dr Haroldine Laws    Reason for Consultation: Heart Failure   HPI:    Chad [name] is seen today for evaluation of heart failure/chest pain at the request of Dr Lacinda Axon.   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 Yesterday he missed all afternoon cardiac medications.  Says he had a busy day and feel asleep without taking his medications. Last dose of lasix was yesterday. This morning with he woke up with chest discomfort, nausea, and shortness of breath. CXR with cardiomegaly and no edema. Pertinent labs include: troponin 0.0, K 3.5, creatinine 1.3, hgb 14.3. CT of chest w/o contrast  As noted below.He has received IV fluids, prednisone, morphine and zofran. He is feeling better and eating food. Denies SOB.    CT Chest W/O Contrast10/18/2018 Status post surgical repair of ascending thoracic aorta. 6 cm proximal descending thoracic aortic aneurysm is noted which is not significantly changed compared to prior exam. Dissection cannot be excluded due the lack of intravenous contrast. Stable aortic root dilatation is noted at 4.2 cm. Stable calcified mediastinal adenopathy most consistent granulomatous disease. Mild cholelithiasis. Severe left renal atrophy.  ECHO 01/2017 EF 200-25%.   Review of Systems: [y] = yes, [ ]  = no   General: Weight gain [ ] ; Weight loss [ ] ; Anorexia [ ] ; Fatigue [Y ]; Fever [ ] ; Chills [ ] ; Weakness [ ]   Cardiac: Chest pain/pressure [ Y]; Resting SOB [ ] ; Exertional SOB [ ] ; Orthopnea [ ] ; Pedal Edema [ ] ; Palpitations [ ] ; Syncope [ ] ; Presyncope [ ] ; Paroxysmal nocturnal dyspnea[ ]   Pulmonary: Cough [ ] ; Wheezing[ ] ; Hemoptysis[ ] ; Sputum [ ] ;  Snoring [ ]   GI: Vomiting[ ] ; Dysphagia[ ] ; Melena[ ] ; Hematochezia [ ] ; Heartburn[ ] ; Abdominal pain [ ] ; Constipation [ ] ; Diarrhea [ ] ; BRBPR [ ]   GU: Hematuria[ ] ; Dysuria [ ] ; Nocturia[ ]   Vascular: Pain in legs with walking [ ] ; Pain in feet with lying flat [ ] ; Non-healing sores [ ] ; Stroke [ ] ; TIA [ ] ; Slurred speech [ ] ;  Neuro: Headaches[ ] ; Vertigo[ ] ; Seizures[ ] ; Paresthesias[ ] ;Blurred vision [ ] ; Diplopia [ ] ; Vision changes [ ]   Ortho/Skin: Arthritis [ ] ; Joint pain [Y ]; Muscle pain [ ] ; Joint swelling [ ] ; Back Pain [ ] ; Rash [ ]   Psych: Depression[ ] ; Anxiety[ Y]  Heme: Bleeding problems [ ] ; Clotting disorders [ ] ; Anemia [ ]   Endocrine: Diabetes [ ] ; Thyroid dysfunction[Y ]  Home Medications Prior to Admission medications   Medication Sig Start Date End Date Taking? Authorizing Provider  albuterol (PROVENTIL HFA;VENTOLIN HFA) 108 (90 Base) MCG/ACT inhaler Inhale 2 puffs into the lungs every 6 (six) hours as needed for wheezing or shortness of breath. 05/12/16  Yes Olam Idler, MD  carvedilol (COREG) 25 MG tablet Take 1 tablet (25 mg total) by mouth 2 (two) times daily with a meal. 01/31/17  Yes Bensimhon, Shaune Pascal, MD  clopidogrel (PLAVIX) 75 MG tablet TAKE ONE TABLET BY MOUTH DAILY 08/01/17  Yes Bensimhon, Shaune Pascal, MD  cyclobenzaprine (FLEXERIL) 10 MG tablet Take 10 mg by mouth daily as needed for muscle spasms.    Yes  [provider]  diphenhydrAMINE (BENADRYL) 25 MG tablet Take 25 mg by mouth every 6 (six) hours as needed for allergies.   Yes [provider]  furosemide (LASIX) 40 MG tablet Take 1 tablet (40 mg total) by mouth 2 (two) times daily as needed for edema. Patient taking differently: Take 40 mg by mouth 2 (two) times daily as needed for fluid.  01/31/17  Yes Bensimhon, Shaune Pascal, MD  hydrALAZINE (APRESOLINE) 50 MG tablet Take 75 mg by mouth 2 (two) times daily.   Yes [provider]  ibuprofen (ADVIL,MOTRIN) 200 MG tablet Take 200 mg by  mouth every 6 (six) hours as needed for moderate pain.   Yes [provider]  isosorbide mononitrate (IMDUR) 60 MG 24 hr tablet Take 1 tablet (60 mg total) by mouth 2 (two) times daily. 01/31/17  Yes Bensimhon, Shaune Pascal, MD  KLOR-CON M10 10 MEQ tablet TAKE ONE TABLET BY MOUTH ONCE DAILY Patient taking differently: TAKE ONE TABLET BY MOUTH ONCE DAILY AS NEEDED FOR SUPPLEMENT 09/04/17  Yes Bensimhon, Shaune Pascal, MD  naphazoline (NAPHCON) 0.1 % ophthalmic solution Place 1 drop into both eyes 4 (four) times daily as needed for irritation.   Yes [provider]  nitroGLYCERIN (NITROSTAT) 0.4 MG SL tablet PLACE ONE TABLET UNDER THE TONGUE EVERY 5 MINUTES FOR 3 DOSES AS NEEDED FOR CHEST PAIN 07/02/17  Yes Bensimhon, Shaune Pascal, MD  Oxycodone HCl 10 MG TABS Take 20 mg by mouth 3 (three) times daily.    Yes [provider]  sacubitril-valsartan (ENTRESTO) 97-103 MG Take 1 tablet by mouth 2 (two) times daily.   Yes [provider]  sildenafil (VIAGRA) 100 MG tablet TAKE 1/2 TABLET BY MOUTH DAILY AS NEEDED FOR ERECTILE DYSFUNCTION 09/24/17  Yes Bensimhon, Shaune Pascal, MD  Spacer/Aero-Holding Chambers (AEROCHAMBER PLUS) inhaler Use as instructed 05/12/16  Yes Olam Idler, MD  spironolactone (ALDACTONE) 25 MG tablet Take 0.5 tablets (12.5 mg total) by mouth daily. Patient taking differently: Take 12.5 mg by mouth at bedtime.  01/31/17  Yes Bensimhon, Shaune Pascal, MD    Past Medical History: Past Medical History:  Diagnosis Date  . AICD (automatic cardioverter/defibrillator) present   . Anemia   . Anginal pain ([place])   . Aortic dissection, thoracoabdominal ([place])    7/10: Type I s/p repair  . Asthma   . CAD (coronary artery disease)    a. s/p CABG 2006;  b. DES to PDA 2011 (cath: Dx not seen, dRCA/PDA tx with DES; S-PDA occluded (culprit), S-Dx occluded, S-RI and OM ok, L-LAD ok  . Carotid stenosis    dopplers 2011: 0-39% bilat.  . Chest pain syndrome   . CHF (congestive heart  failure) ([place])   . Chronic bronchitis ([place])   . Chronic lower back pain   . Chronic systolic heart failure (HCC)    a. 12/13 ECHO: EF 35-40%, sept, apical & posterobasal HK, LV mod dil & sys fx mod reduced, mild AI, MV mild reg, TV mild reg  . Complication of anesthesia    "difficult to wake afterwards a couple of times"  . 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 Dyspnea   . Family history of adverse reaction to anesthesia    "sister hard to wake up"  . GERD (gastroesophageal reflux disease)   . Gout   . Heart murmur   . HLD (hyperlipidemia)   . HTN (hypertension)  severe  . Myocardial infarction [facility])    "many" (11/07/2016)  . PONV (postoperative nausea and vomiting)   . Sleep apnea    "never RX'd mask" (11/07/2016)  . Thyroid cancer ([place])    Hertle Cell    Past Surgical History: Past Surgical History:  Procedure Laterality Date  . BACK SURGERY    . CARDIAC CATHETERIZATION     "several" (11/07/2016)  . CARDIAC DEFIBRILLATOR PLACEMENT  11/2005   [employer]; Archie Endo 05/09/2011  . CORONARY ANGIOPLASTY WITH STENT PLACEMENT     "I've had 1-2 stents" (11/07/2016)  . CORONARY ARTERY BYPASS GRAFT  06/21/2009   "CABG X2"  . CORONARY ARTERY BYPASS GRAFT  06/29/2005   "CABG X7"  . ICD LEAD REMOVAL  11/07/2016  . ICD LEAD REMOVAL N/A 11/07/2016   Procedure: ICD LEAD REMOVAL, INSERTION OF NEW ICD LEAD;  Surgeon: Evans Lance, MD;  Location: [facility];  Service: Cardiovascular;  Laterality: N/A;  Dr. Prescott Gum to backup case  . IMPLANTABLE CARDIOVERTER DEFIBRILLATOR (ICD) GENERATOR CHANGE N/A 12/13/2012   Procedure: ICD GENERATOR CHANGE;  Surgeon: Evans Lance, MD;  Location: [facility] CATH LAB;  Service: Cardiovascular;  Laterality: N/A;  . LUMBAR [facility] SURGERY  01/2001    most recent within 5-10 years  . SHOULDER ARTHROSCOPY WITH ROTATOR CUFF REPAIR Bilateral   . Status post emergency repair of a type A ascending aortic  dissection with a hemiarch reconstruction of the ascending aorta  using a 28-mm Hemashield graft with redo sternotomy and revision of previous bypass grafts in June 2010.    . TESTICLE SURGERY    . THYROIDECTOMY, PARTIAL  06/20/2011  . VASECTOMY      Family History: Family History  Problem Relation Age of Onset  . Hypertension Father   . Heart disease Father   . Early death Father   . COPD Father   . Hypertension Mother   . Coronary artery disease Unknown     Social History: Social History   Social History  . Marital status: Divorced    Spouse name: N/A  . Number of children: N/A  . Years of education: N/A   Occupational History  . disabled    Social History Main Topics  . Smoking status: Never Smoker  . Smokeless tobacco: Never Used  . Alcohol use No  . Drug use: No  . Sexual activity: Yes   Other Topics Concern  . None   Social History Narrative   Engaged and lives with fiancee and 4 kids.    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     Allergies:  Allergies  Allergen Reactions  . Iohexol Anaphylaxis and Other (See Comments)     Desc: PT HAS ANAPHYLAXIS WITH CONTRAST MEDIA!  . Lipitor [Atorvastatin Calcium] Anaphylaxis and Other (See Comments)    Large doses  . Shellfish Allergy Anaphylaxis  . Sulfa Antibiotics Shortness Of Breath and Swelling  . Sulfonamide Derivatives Shortness Of Breath and Swelling  . Metrizamide Swelling  . Latex Rash    With long periods of exposure  . Zocor [Simvastatin] Other (See Comments)    Muscle cramps    Objective:    Vital Signs:   Temp:  [97.9 F (36.6 C)] 97.9 F (36.6 C) (10/18 0928) Pulse Rate:  [48-71] 68 (10/18 1415) Resp:  [14-20] 20 (10/18 1415) BP: (95-119)/(61-76) 104/64 (10/18 1415) SpO2:  [94 %-99 %] 96 % (10/18 1415)    Weight change: There were no vitals filed for this visit.  Intake/Output:   Intake/Output Summary (Last 24 hours) at 10/11/17 1433 Last data filed at 10/11/17 1104   Gross per 24 hour  Intake              500 ml  Output                0 ml  Net              500 ml      Physical Exam    General:  Well appearing. No resp difficulty HEENT: normal Neck: supple. JVP 6-7. Carotids 2+ bilat; no bruits. No lymphadenopathy or thyromegaly appreciated. Cor: PMI nondisplaced. Regular rate & rhythm. No rubs, gallops. 2/6 TR   Lungs: clear Abdomen: soft, nontender, nondistended. No hepatosplenomegaly. No bruits or masses. Good bowel sounds. Extremities: no cyanosis, clubbing, rash, edema Neuro: alert & orientedx3, cranial nerves grossly intact. moves all 4 extremities w/o difficulty. Affect pleasant   Telemetry   NSR 70s personally reviewed   EKG    NSR 72 bpm   Labs   Basic Metabolic Panel:  Recent Labs Lab 10/11/17 1032  NA 136  K 3.5  CL 103  CO2 23  GLUCOSE 125*  BUN 12  CREATININE 1.30*  CALCIUM 8.5*    Liver Function Tests: No results for input(s): AST, ALT, ALKPHOS, BILITOT, PROT, ALBUMIN in the last 168 hours. No results for input(s): LIPASE, AMYLASE in the last 168 hours. No results for input(s): AMMONIA in the last 168 hours.  CBC:  Recent Labs Lab 10/11/17 1032  WBC 4.6  HGB 14.3  HCT 41.9  MCV 83.1  PLT 104*    Cardiac Enzymes: No results for input(s): CKTOTAL, CKMB, CKMBINDEX, TROPONINI in the last 168 hours.  BNP: BNP (last 3 results)  Recent Labs  02/02/17 1203 02/09/17 1532 09/19/17 0944  BNP 824.8* 413.8* 834.8*    ProBNP (last 3 results) No results for input(s): PROBNP in the last 8760 hours.   CBG: No results for input(s): GLUCAP in the last 168 hours.  Coagulation Studies: No results for input(s): LABPROT, INR in the last 72 hours.   Imaging   Dg Chest 2 View  Result Date: 10/11/2017 CLINICAL DATA:  Chest pain EXAM: CHEST  2 VIEW COMPARISON:  06/01/2017 chest radiograph. FINDINGS: Stable configuration of sternotomy wires and single lead left subclavian ICD. Stable cardiomediastinal  silhouette with mild cardiomegaly and prominent tortuous aortic arch. No pneumothorax. No pleural effusion. Cephalization of the pulmonary vasculature without overt pulmonary edema. No acute consolidative airspace disease. IMPRESSION: Stable mild cardiomegaly without overt pulmonary edema. No active pulmonary disease. Electronically Signed   By: Ilona Sorrel M.D.   On: 10/11/2017 10:09   Ct Chest Wo Contrast  Result Date: 10/11/2017 CLINICAL DATA:  Chest pain. EXAM: CT CHEST WITHOUT CONTRAST TECHNIQUE: Multidetector CT imaging of the chest was performed following the standard protocol without IV contrast. COMPARISON:  Radiographs of same day.  CT scan of June 01, 2017. FINDINGS: Cardiovascular: Atherosclerosis of thoracic aorta is noted. Status post surgical repair of ascending thoracic aorta. Status post coronary artery bypass graft. 6 cm proximal descending thoracic aortic aneurysm is noted which is not significantly changed based on my own measurement of same area on prior exam. Due to the lack of intravenous contrast, dissection cannot be excluded. Left-sided pacemaker is unchanged in position. Aortic root is dilated at 4.2 cm. 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 Mild cardiomegaly is noted. No pericardial  effusion is noted. Mediastinum/Nodes: Stable calcified pretracheal and right hilar lymph nodes are noted most consistent with granulomatous disease. Thyroid gland is unremarkable. Esophagus is unremarkable. Lungs/Pleura: No pneumothorax or pleural effusion is noted. Stable calcified granuloma is noted in right upper lobe. Stable left basilar scarring is noted. Upper Abdomen: Mild cholelithiasis is noted. Severe left renal atrophy is noted. Musculoskeletal: No chest wall mass or suspicious bone lesions identified. IMPRESSION: Status post surgical repair of ascending thoracic aorta. 6 cm proximal descending thoracic aortic aneurysm is noted which is not  significantly changed compared to prior exam. Dissection cannot be excluded due the lack of intravenous contrast. Stable aortic root dilatation is noted at 4.2 cm. Stable calcified mediastinal adenopathy most consistent granulomatous disease. Mild cholelithiasis. Severe left renal atrophy. Aortic Atherosclerosis (ICD10-I70.0). Electronically Signed   By: Marijo Conception, M.D.   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   Missed all afternoon cardiac medications 10/10/2017 Assessment/Plan   1. Chest Pain  Troponin 0.0. Does not appear to be ischemia. EKG reviewed.  2. Chronic Systolic Heart Failure - ICM. ECHO 01/2017 WF 20-25% Volume status stable. Continue lasix as needed.  Continue current HF medications.  3. H/O Type 1 Aortic Dissection  4. CKD in the setting of solitary kidney  Todays creatinine is 1.3 5. HTN - Stable  6. CAD- S/P CABG. Continue plavix, aspirin. No statin due to intolerance.   Per Dr Haroldine Laws able to go home if he can walk around.   Length of Stay: 0  Darrick Grinder, NP  10/11/2017, 2:33 PM  Advanced Heart Failure Team Pager [phone] (M-F; 7a - 4p)  Please contact [facility] for night-coverage after hours (4p -7a ) and weekends on [email or web address]  Patient seen and examined with Darrick Grinder, NP. We discussed all aspects of the encounter. I agree with the assessment and plan as stated above.   Difficult case. He has h/o of aortic dissection as well as ischemic CM s/p CABG. He has solitary kidney. He has done well but has intermittent CP. CP recurred this am in the setting of missing his BP med yesterday. Pain now improved with BP control. Troponin and  ECG ok. No evidence of ACS. Non-contrast CT chest with mildly increased dimensions but no evidence of acute process.   Given the above findings, I think the risk of further imaging/cath is not worth the risk with his contrast allergy and solitary kidney. I think he is stable to go home today with close ongoing outpatient f/u. May be worth a referrral to the aorta program at [facility] as outpatient.   D/w Dr. Lacinda Axon.    Glori Bickers, MD  3:32 PM

## 2017-10-17 ENCOUNTER — Other Ambulatory Visit (HOSPITAL_COMMUNITY): Payer: Self-pay | Admitting: Internal Medicine

## 2017-10-18 ENCOUNTER — Other Ambulatory Visit: Payer: Self-pay | Admitting: *Deleted

## 2017-10-18 MED ORDER — ALBUTEROL SULFATE HFA 108 (90 BASE) MCG/ACT IN AERS: 2.0000 | INHALATION_SPRAY | Freq: Four times a day (QID) | RESPIRATORY_TRACT | 0 refills | Status: DC | PRN

## 2017-10-19 ENCOUNTER — Other Ambulatory Visit: Payer: Self-pay | Admitting: Internal Medicine

## 2017-10-19 IMAGING — CT CT ABD-PELV W/O CM
2 of 4 series · 12 of 36 positions shown, 15 images · non-contrast
Comparison: 12/05/2015

CLINICAL DATA: Low back pain. History of aortic dissection.
Contrast allergy. Status post repair of type B ascending aortic
dissection with hemi arch reconstruction.

EXAM:
CT CHEST, ABDOMEN AND PELVIS WITHOUT CONTRAST
TECHNIQUE: Multidetector CT imaging of the chest, abdomen and pelvis was
performed following the standard protocol without IV contrast.

[Series 3: cap wo 5.0 i31f 2 · axial · 0.80mm/px · z∈[-207,+343]mm · 9 of 134 slices shown, 12 images]
[im 12/134  mediastinal]
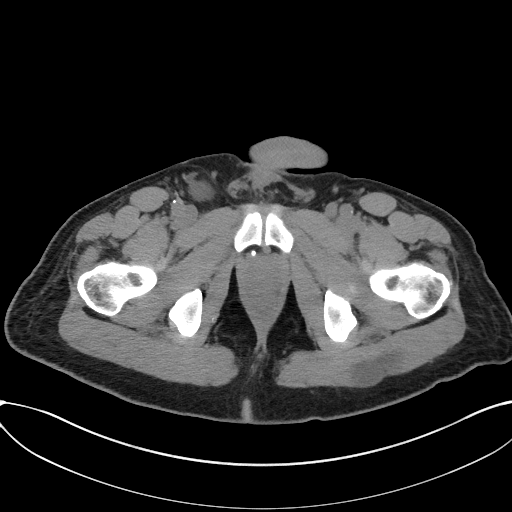
[im 12/134  lung]
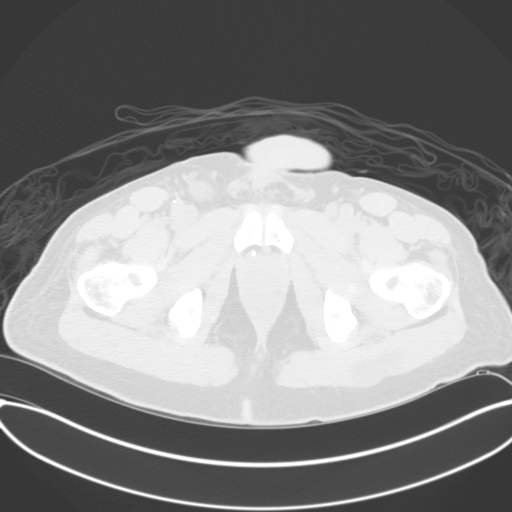
[im 23/134  lung]
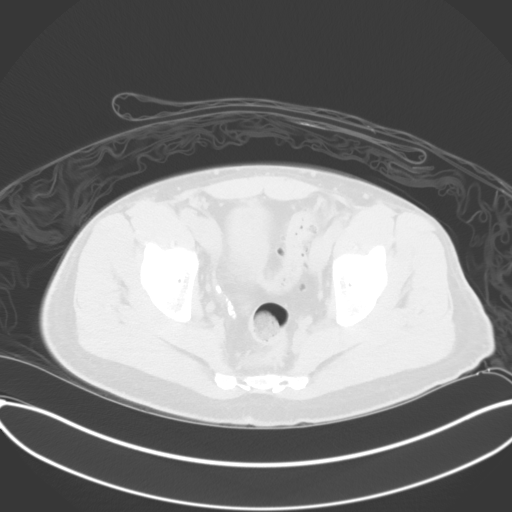
[im 45/134  lung]
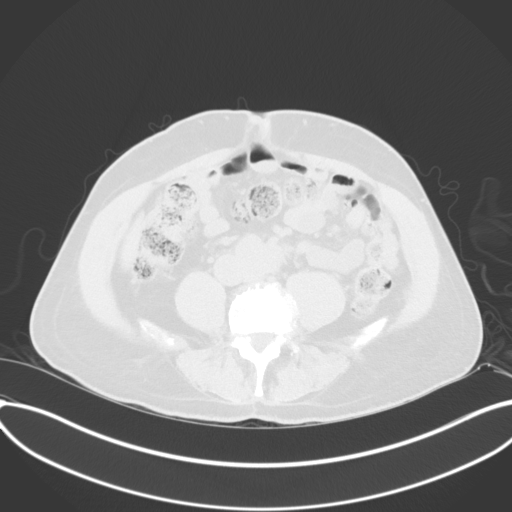
[im 56/134  lung]
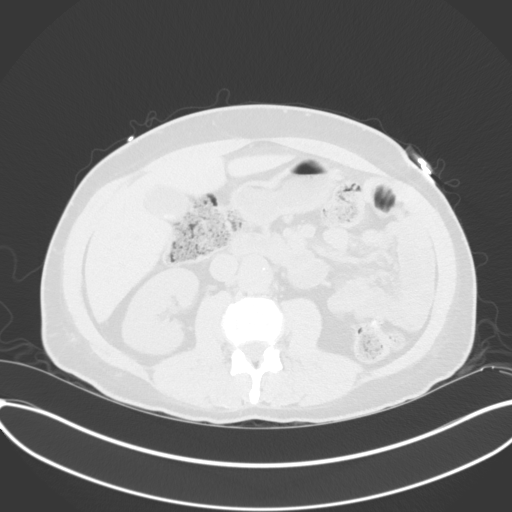
[im 67/134  mediastinal]
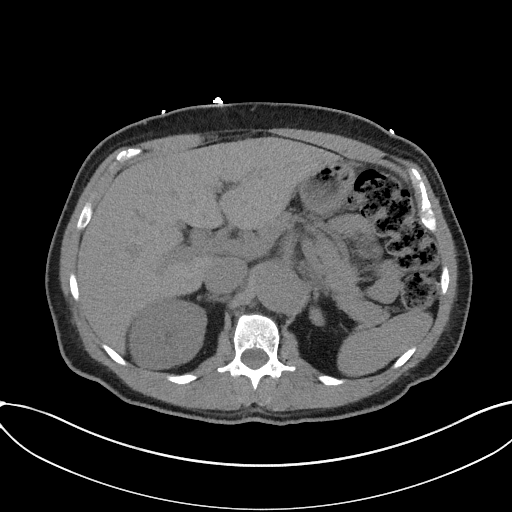
[im 67/134  lung]
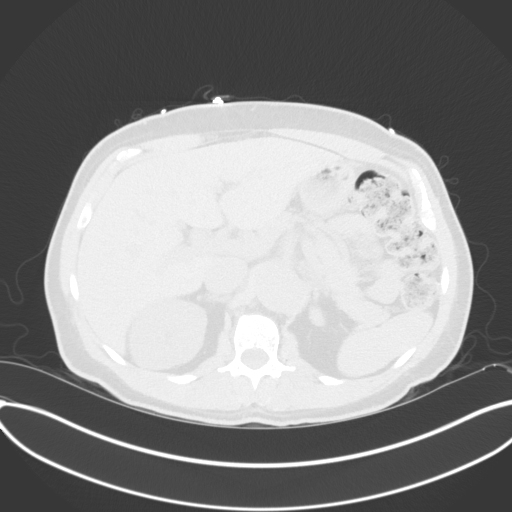
[im 78/134  lung]
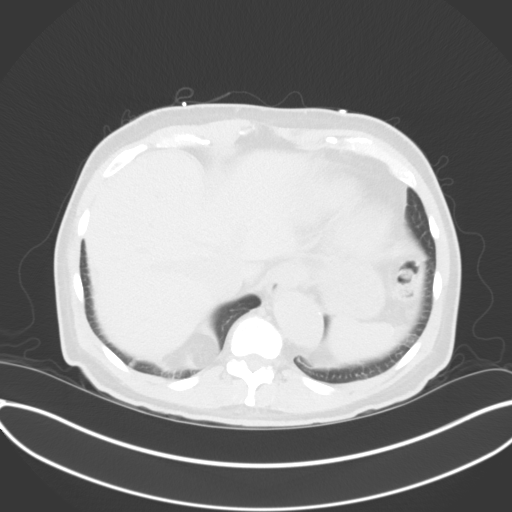
[im 89/134  lung]
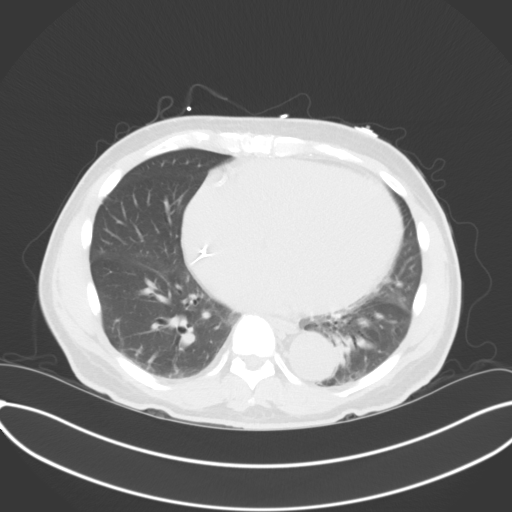
[im 111/134  lung]
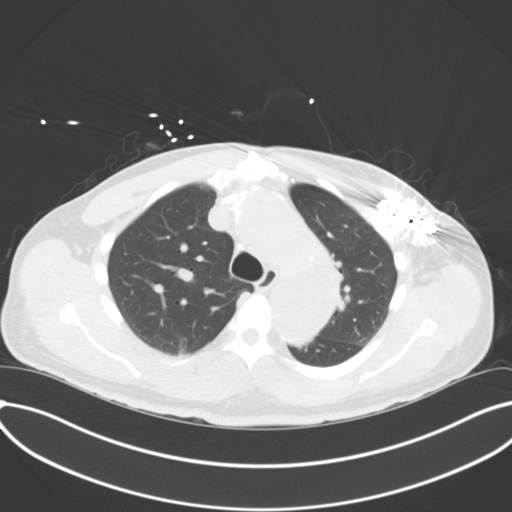
[im 122/134  mediastinal]
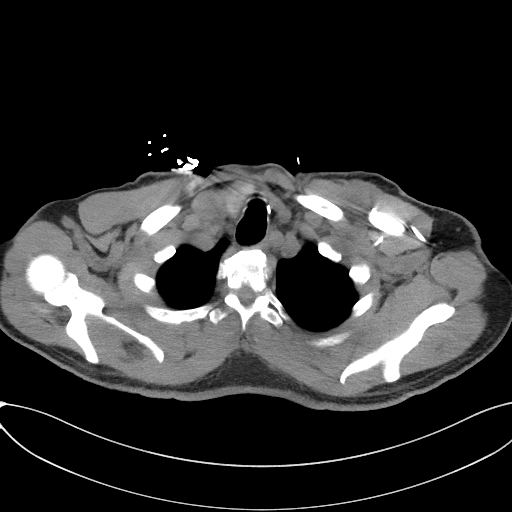
[im 122/134  lung]
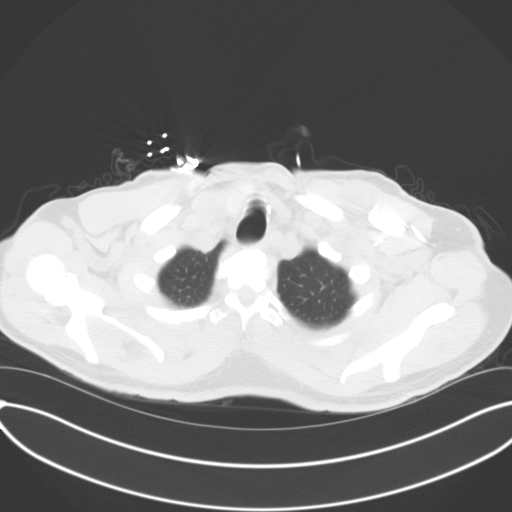

[Series 5: coronal · coronal · 0.73mm/px · 3 of 142 slices shown]
[im 29/142  lung]
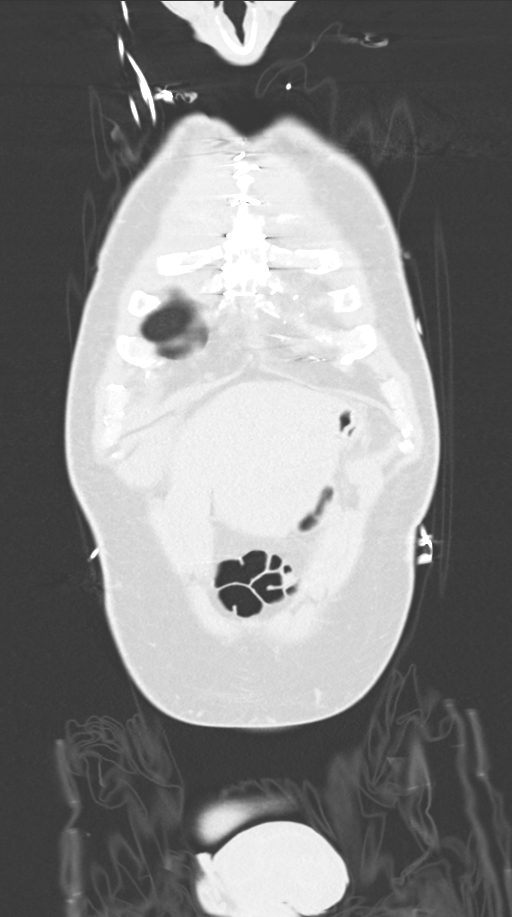
[im 57/142  lung]
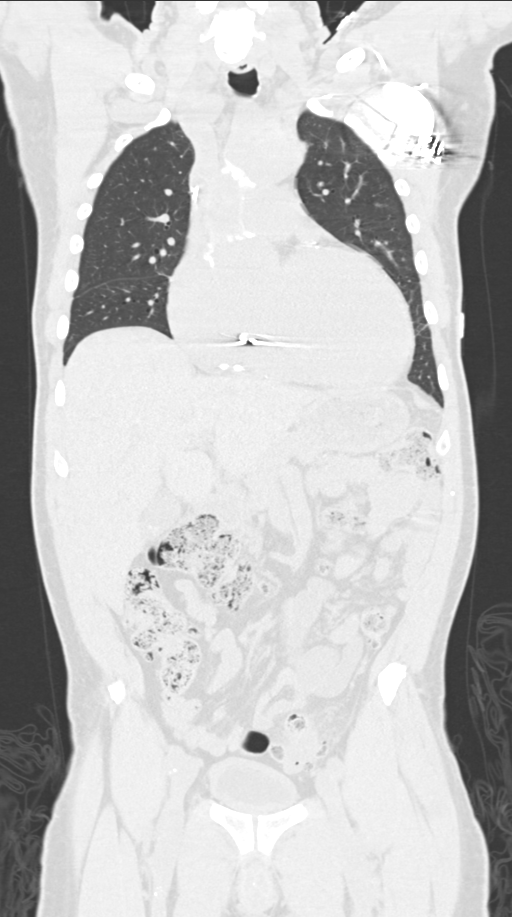
[im 85/142  lung]
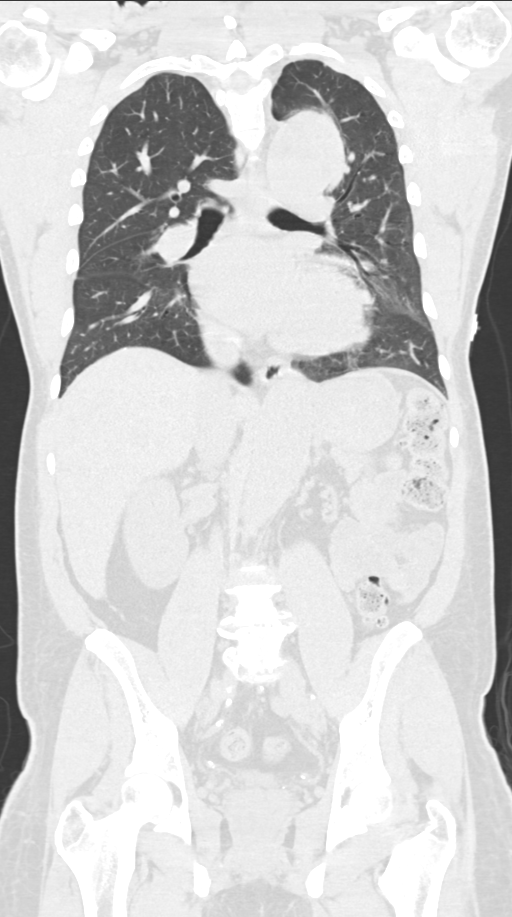

[12 of 36 positions shown; findings below may reference images not displayed]

FINDINGS: CT CHEST FINDINGS

Cardiovascular: Status post ascending aortic repair, without acute
complication identified. The aortic root measures 4.5 cm today on
image 39/series 3 versus 4.5 cm on 10/11/2014.

The extent of dissection cannot be evaluated on this noncontrast
exam. The right brachiocephalic artery measures 3.6 cm on image
19/series 3 versus similar on the prior (when remeasured). Other
great vessels are normal in caliber.

The transverse aorta measures 4.9 cm on image 23/series 3 today
versus 4.3 cm on the prior exam at the same level.

The proximal descending aorta measures 4.7 cm also on image
23/series 3 versus 4.4 cm on the prior. No distal descending aortic
dilatation. Tortuosity. Moderate cardiomegaly with pacer in place.
No mediastinal hematoma. No pericardial effusion.

Mediastinum/Nodes: Calcified mediastinal and right hilar nodes are
likely related to old granulomatous disease.

Lungs/Pleura: No pleural fluid. Calcified right apical granulomas.
Minimal motion degradation inferiorly.

Musculoskeletal: No acute osseous abnormality.

CT ABDOMEN PELVIS FINDINGS

Hepatobiliary: Normal noncontrast appearance of the liver. Multiple
small gallstones without acute cholecystitis or biliary duct
dilatation.

Pancreas: Normal, without mass or ductal dilatation.

Spleen: Normal in size, without focal abnormality.

Adrenals/Urinary Tract: Normal adrenal glands. Marked left renal
atrophy. No renal calculi or hydronephrosis. No hydroureter or
ureteric calculi. No bladder calculi. Bladder is borderline thick
walled.

Stomach/Bowel: Normal stomach, without wall thickening. Extensive
colonic diverticulosis. There is pericolonic inflammation about the
proximal sigmoid, including on image 108/ series 3. No pericolonic
fluid collection or free extraluminal gas. Normal terminal ileum and
appendix. Normal small bowel.

Vascular/Lymphatic: Chronic dissection within the aorta as evidenced
by apparent intraluminal calcifications, including image 86/series
3. The aorta is similar in caliber, maximally 3.3 cm on image
87/series 3. Bilateral common iliac artery dilatation, including at
1.6 cm on the right and 2.0 cm on the left. Similar. No
abdominopelvic adenopathy.

Reproductive: Mild prostatomegaly. Low-density in the right inguinal
canal is similar and likely represents a non distended testes.

Other: No significant free fluid.

Musculoskeletal: Degenerative disc disease at L4-5 and L5-S1.
IMPRESSION: 1. Status post ascending aortic repair. Dilatation of the aortic
root appears similar back to 4703.
2. Similar dilatation of the right brachiocephalic artery. Slight
increase in transverse and proximal descending aortic dilatation.
The extent of dissection cannot be evaluated secondary to
noncontrast technique. No surrounding hematoma identified.
3. Similar infrarenal abdominal aortic and common iliac artery
dilatation compared to 7418.
4. Sigmoid diverticulitis, non complicated.
5. Cholelithiasis.
6. Marked left renal atrophy.
7. Prostatomegaly with borderline bladder wall thickening,
suggesting a component of outlet obstruction.
8. Low-density in the right inguinal canal likely represents a
nondistended right testicle. Similar.
9. Minimal motion in the chest.

## 2017-10-19 IMAGING — DX DG CHEST 2V
2 series · 2 of 2 positions shown · non-contrast
Comparison: Two-view chest x-ray 11/08/2016

CLINICAL DATA: Bilateral flank pain. Mid back pain. Personal
history of dissection.

EXAM:
CHEST  2 VIEW

[w chest pa]
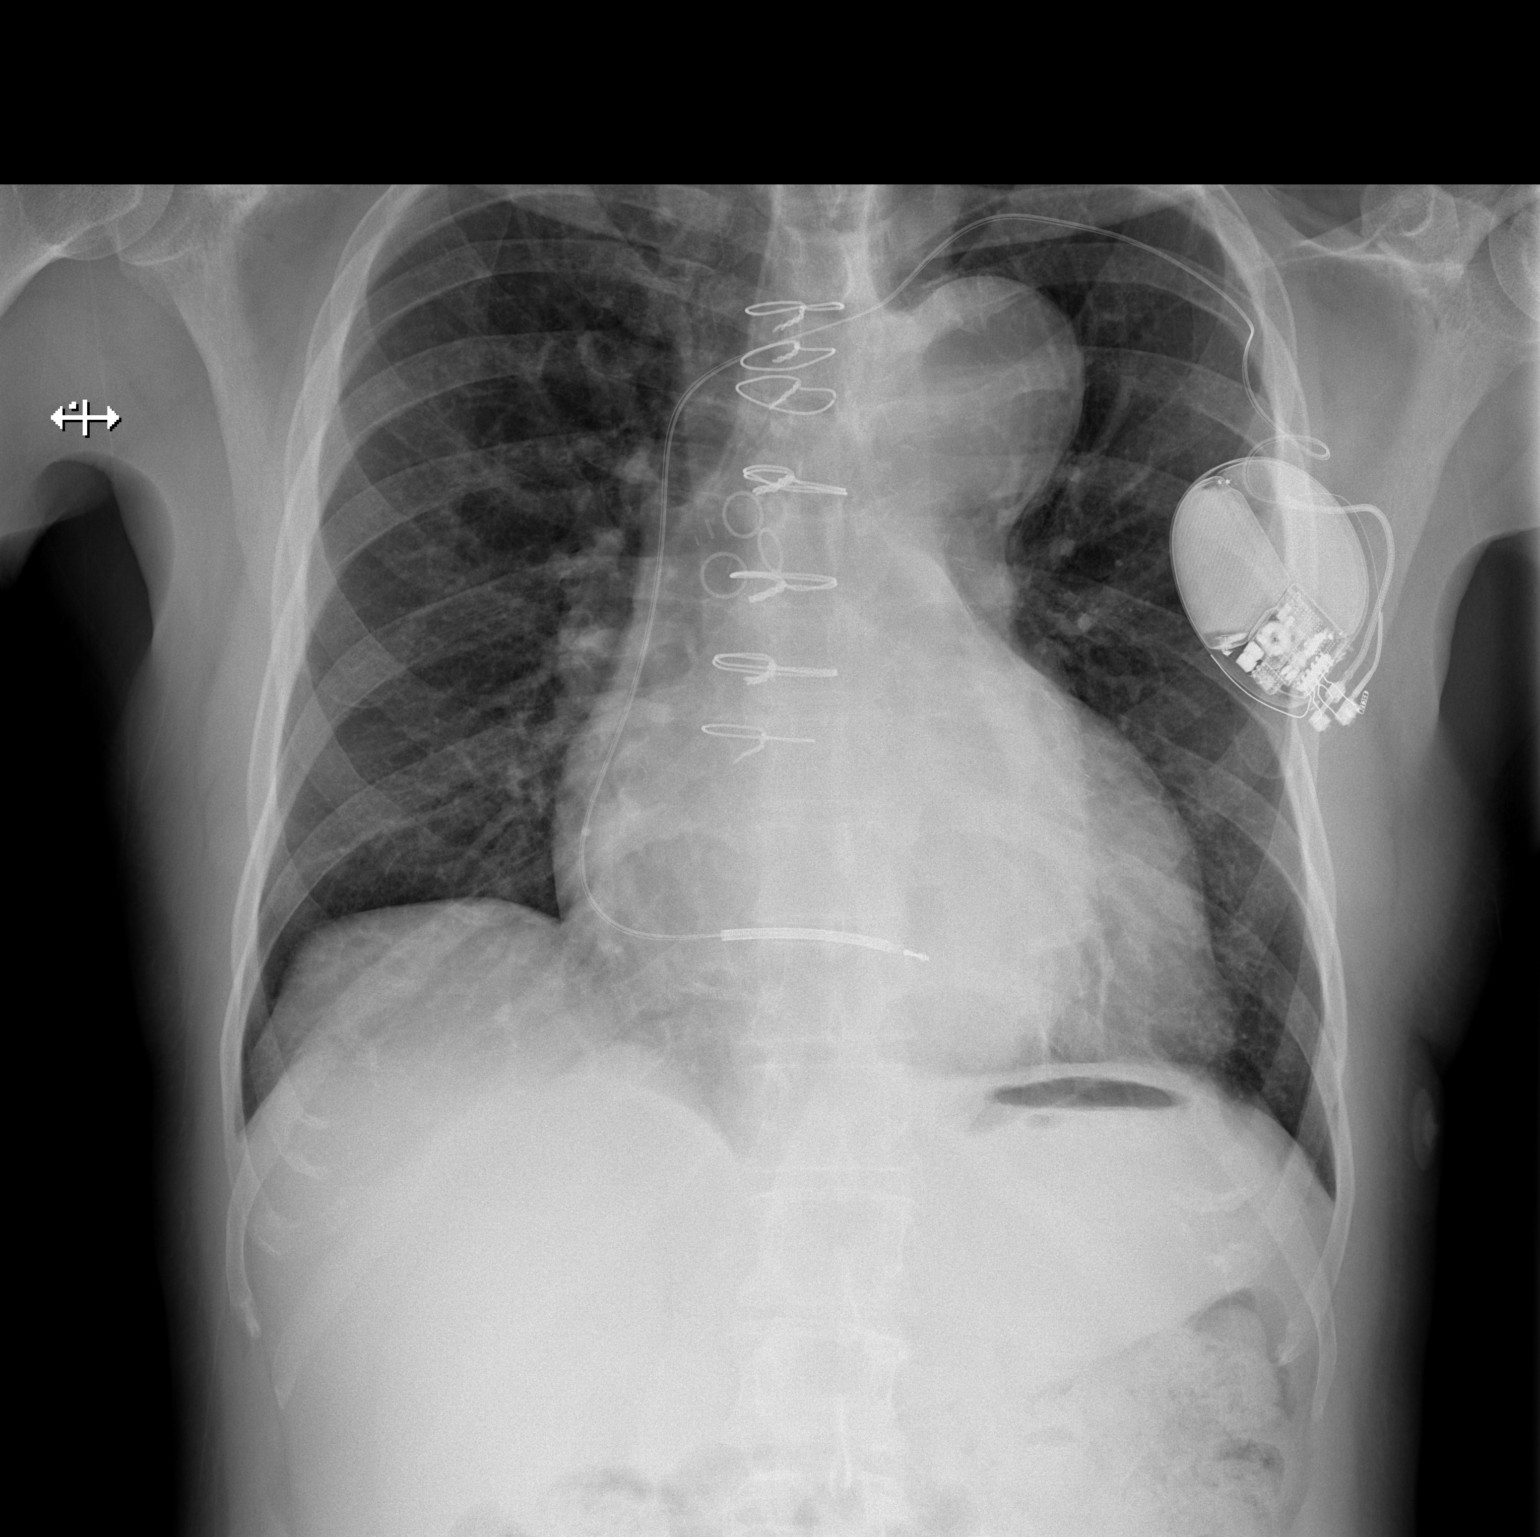

[w chest lat]
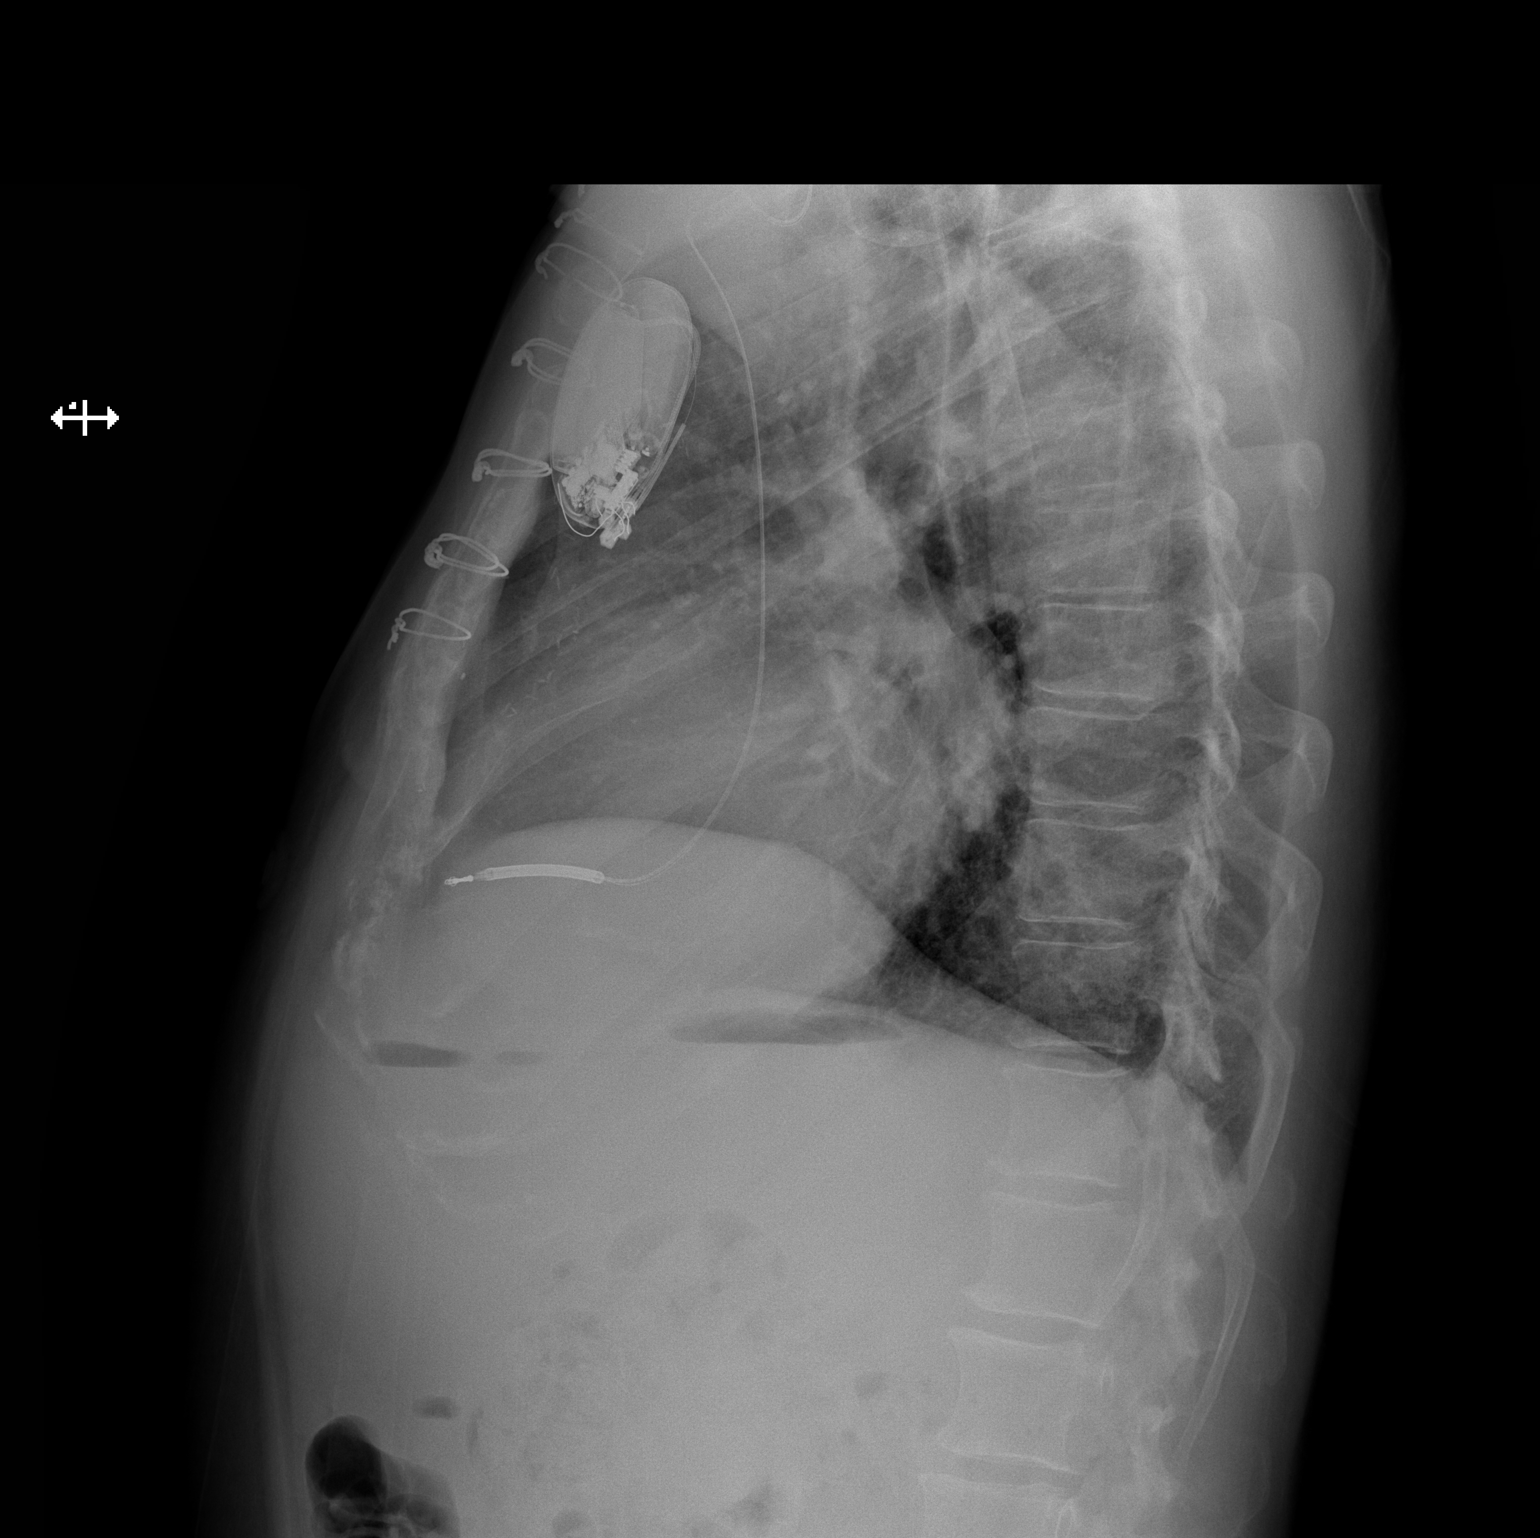

[2 of 2 positions shown; findings below may reference images not displayed]

FINDINGS: Heart is enlarged. Left subclavian AICD is stable. Median sternotomy
for CABG is noted. Prominence of the aortic arch is stable. There is
no edema or effusion. No focal airspace disease is present.
IMPRESSION: 1. No acute cardiopulmonary disease.
2. Stable cardiomegaly without failure.
3. Stable prominence of the aortic arch compatible with known
chronic dissection.

## 2017-10-19 MED ORDER — ALBUTEROL SULFATE HFA 108 (90 BASE) MCG/ACT IN AERS: 2.0000 | INHALATION_SPRAY | Freq: Four times a day (QID) | RESPIRATORY_TRACT | 0 refills | Status: DC | PRN

## 2017-10-22 ENCOUNTER — Ambulatory Visit: Payer: Medicaid Other | Admitting: Internal Medicine

## 2017-10-25 ENCOUNTER — Emergency Department (HOSPITAL_COMMUNITY)
Admission: EM | Admit: 2017-10-25 | Discharge: 2017-10-25 | Discharge status: Absent without leave | Payer: Medicaid Other

## 2017-10-25 DIAGNOSIS — D649 Anemia, unspecified: Secondary | ICD-10-CM | POA: Diagnosis not present

## 2017-10-25 DIAGNOSIS — I2510 Atherosclerotic heart disease of native coronary artery without angina pectoris: Secondary | ICD-10-CM | POA: Diagnosis not present

## 2017-10-25 DIAGNOSIS — J189 Pneumonia, unspecified organism: Secondary | ICD-10-CM | POA: Diagnosis not present

## 2017-10-25 DIAGNOSIS — R0602 Shortness of breath: Secondary | ICD-10-CM | POA: Diagnosis not present

## 2017-10-25 DIAGNOSIS — I509 Heart failure, unspecified: Secondary | ICD-10-CM | POA: Diagnosis not present

## 2017-10-25 DIAGNOSIS — Z7901 Long term (current) use of anticoagulants: Secondary | ICD-10-CM | POA: Diagnosis not present

## 2017-10-25 DIAGNOSIS — I10 Essential (primary) hypertension: Secondary | ICD-10-CM | POA: Diagnosis not present

## 2017-10-25 DIAGNOSIS — C73 Malignant neoplasm of thyroid gland: Secondary | ICD-10-CM | POA: Diagnosis not present

## 2017-10-25 DIAGNOSIS — Z95810 Presence of automatic (implantable) cardiac defibrillator: Secondary | ICD-10-CM | POA: Diagnosis not present

## 2017-10-25 DIAGNOSIS — M109 Gout, unspecified: Secondary | ICD-10-CM | POA: Diagnosis not present

## 2017-10-25 DIAGNOSIS — R918 Other nonspecific abnormal finding of lung field: Secondary | ICD-10-CM | POA: Diagnosis not present

## 2017-10-25 DIAGNOSIS — I259 Chronic ischemic heart disease, unspecified: Secondary | ICD-10-CM | POA: Diagnosis not present

## 2017-10-25 DIAGNOSIS — Z8679 Personal history of other diseases of the circulatory system: Secondary | ICD-10-CM | POA: Diagnosis not present

## 2017-10-25 DIAGNOSIS — G4733 Obstructive sleep apnea (adult) (pediatric): Secondary | ICD-10-CM | POA: Diagnosis not present

## 2017-10-25 DIAGNOSIS — J441 Chronic obstructive pulmonary disease with (acute) exacerbation: Secondary | ICD-10-CM | POA: Diagnosis not present

## 2017-11-09 ENCOUNTER — Other Ambulatory Visit (HOSPITAL_COMMUNITY): Payer: Self-pay | Admitting: Internal Medicine

## 2017-11-12 ENCOUNTER — Ambulatory Visit (INDEPENDENT_AMBULATORY_CARE_PROVIDER_SITE_OTHER): Payer: Medicaid Other | Admitting: *Deleted

## 2017-11-12 DIAGNOSIS — R001 Bradycardia, unspecified: Secondary | ICD-10-CM

## 2017-11-12 DIAGNOSIS — I5022 Chronic systolic (congestive) heart failure: Secondary | ICD-10-CM | POA: Diagnosis not present

## 2017-11-12 NOTE — Progress Notes (Signed)
Remote ICD transmission.   

## 2017-11-13 LAB — CUP PACEART REMOTE DEVICE CHECK
Battery Remaining Longevity: 102 mo
Battery Remaining Percentage: 100 %
HIGH POWER IMPEDANCE MEASURED VALUE: 51 Ohm
Implantable Lead Location: 753860
Implantable Lead Serial Number: 333496
Lead Channel Impedance Value: 457 Ohm
Lead Channel Pacing Threshold Pulse Width: 0.4 ms
Lead Channel Setting Pacing Amplitude: 3.5 V
Lead Channel Setting Sensing Sensitivity: 0.5 mV
MDC IDC LEAD IMPLANT DT: 20171114
MDC IDC MSMT LEADCHNL RV PACING THRESHOLD AMPLITUDE: 0.6 V
MDC IDC PG IMPLANT DT: 20131220
MDC IDC SESS DTM: 20181119120400
MDC IDC SET LEADCHNL RV PACING PULSEWIDTH: 0.4 ms
MDC IDC STAT BRADY RV PERCENT PACED: 0 %
Pulse Gen Serial Number: 124654

## 2017-11-22 ENCOUNTER — Encounter: Payer: Self-pay | Admitting: Cardiology

## 2017-11-24 DIAGNOSIS — I10 Essential (primary) hypertension: Secondary | ICD-10-CM | POA: Diagnosis not present

## 2017-11-24 DIAGNOSIS — D649 Anemia, unspecified: Secondary | ICD-10-CM | POA: Diagnosis not present

## 2017-11-24 DIAGNOSIS — M109 Gout, unspecified: Secondary | ICD-10-CM | POA: Diagnosis not present

## 2017-11-24 DIAGNOSIS — I259 Chronic ischemic heart disease, unspecified: Secondary | ICD-10-CM | POA: Diagnosis not present

## 2017-11-24 DIAGNOSIS — G4733 Obstructive sleep apnea (adult) (pediatric): Secondary | ICD-10-CM | POA: Diagnosis not present

## 2017-11-25 ENCOUNTER — Other Ambulatory Visit (HOSPITAL_COMMUNITY): Payer: Self-pay | Admitting: Internal Medicine

## 2017-12-09 ENCOUNTER — Other Ambulatory Visit (HOSPITAL_COMMUNITY): Payer: Self-pay | Admitting: Internal Medicine

## 2017-12-09 DIAGNOSIS — I5043 Acute on chronic combined systolic (congestive) and diastolic (congestive) heart failure: Secondary | ICD-10-CM

## 2017-12-09 DIAGNOSIS — I10 Essential (primary) hypertension: Secondary | ICD-10-CM

## 2017-12-15 ENCOUNTER — Other Ambulatory Visit: Payer: Self-pay

## 2017-12-15 ENCOUNTER — Emergency Department (HOSPITAL_COMMUNITY): Payer: Medicaid Other

## 2017-12-15 ENCOUNTER — Inpatient Hospital Stay (HOSPITAL_COMMUNITY): Payer: Medicaid Other

## 2017-12-15 ENCOUNTER — Encounter (HOSPITAL_COMMUNITY): Payer: Self-pay | Admitting: Emergency Medicine

## 2017-12-15 ENCOUNTER — Inpatient Hospital Stay (HOSPITAL_COMMUNITY)
Admission: EM | Admit: 2017-12-15 | Discharge: 2017-12-16 | DRG: 291 | Disposition: A | Discharge status: Home / Self Care | Payer: Medicaid Other | Attending: Family Medicine | Admitting: Family Medicine

## 2017-12-15 DIAGNOSIS — Z905 Acquired absence of kidney: Secondary | ICD-10-CM | POA: Diagnosis not present

## 2017-12-15 DIAGNOSIS — Z95810 Presence of automatic (implantable) cardiac defibrillator: Secondary | ICD-10-CM | POA: Diagnosis not present

## 2017-12-15 DIAGNOSIS — I252 Old myocardial infarction: Secondary | ICD-10-CM | POA: Diagnosis not present

## 2017-12-15 DIAGNOSIS — N289 Disorder of kidney and ureter, unspecified: Secondary | ICD-10-CM

## 2017-12-15 DIAGNOSIS — Z888 Allergy status to other drugs, medicaments and biological substances status: Secondary | ICD-10-CM | POA: Diagnosis not present

## 2017-12-15 DIAGNOSIS — I340 Nonrheumatic mitral (valve) insufficiency: Secondary | ICD-10-CM | POA: Diagnosis present

## 2017-12-15 DIAGNOSIS — J449 Chronic obstructive pulmonary disease, unspecified: Secondary | ICD-10-CM | POA: Diagnosis present

## 2017-12-15 DIAGNOSIS — G4733 Obstructive sleep apnea (adult) (pediatric): Secondary | ICD-10-CM | POA: Diagnosis present

## 2017-12-15 DIAGNOSIS — M109 Gout, unspecified: Secondary | ICD-10-CM | POA: Diagnosis present

## 2017-12-15 DIAGNOSIS — D696 Thrombocytopenia, unspecified: Secondary | ICD-10-CM | POA: Diagnosis not present

## 2017-12-15 DIAGNOSIS — Z955 Presence of coronary angioplasty implant and graft: Secondary | ICD-10-CM

## 2017-12-15 DIAGNOSIS — D649 Anemia, unspecified: Secondary | ICD-10-CM | POA: Diagnosis present

## 2017-12-15 DIAGNOSIS — I2510 Atherosclerotic heart disease of native coronary artery without angina pectoris: Secondary | ICD-10-CM | POA: Diagnosis present

## 2017-12-15 DIAGNOSIS — I712 Thoracic aortic aneurysm, without rupture: Secondary | ICD-10-CM | POA: Diagnosis present

## 2017-12-15 DIAGNOSIS — Z91040 Latex allergy status: Secondary | ICD-10-CM | POA: Diagnosis not present

## 2017-12-15 DIAGNOSIS — Z85850 Personal history of malignant neoplasm of thyroid: Secondary | ICD-10-CM | POA: Diagnosis not present

## 2017-12-15 DIAGNOSIS — I255 Ischemic cardiomyopathy: Secondary | ICD-10-CM | POA: Diagnosis present

## 2017-12-15 DIAGNOSIS — I130 Hypertensive heart and chronic kidney disease with heart failure and stage 1 through stage 4 chronic kidney disease, or unspecified chronic kidney disease: Secondary | ICD-10-CM | POA: Diagnosis not present

## 2017-12-15 DIAGNOSIS — Z882 Allergy status to sulfonamides status: Secondary | ICD-10-CM | POA: Diagnosis not present

## 2017-12-15 DIAGNOSIS — N183 Chronic kidney disease, stage 3 (moderate): Secondary | ICD-10-CM | POA: Diagnosis present

## 2017-12-15 DIAGNOSIS — I509 Heart failure, unspecified: Secondary | ICD-10-CM

## 2017-12-15 DIAGNOSIS — I5023 Acute on chronic systolic (congestive) heart failure: Secondary | ICD-10-CM | POA: Diagnosis not present

## 2017-12-15 DIAGNOSIS — E876 Hypokalemia: Secondary | ICD-10-CM

## 2017-12-15 DIAGNOSIS — E785 Hyperlipidemia, unspecified: Secondary | ICD-10-CM | POA: Diagnosis present

## 2017-12-15 DIAGNOSIS — K219 Gastro-esophageal reflux disease without esophagitis: Secondary | ICD-10-CM | POA: Diagnosis present

## 2017-12-15 DIAGNOSIS — Z79899 Other long term (current) drug therapy: Secondary | ICD-10-CM

## 2017-12-15 DIAGNOSIS — Z951 Presence of aortocoronary bypass graft: Secondary | ICD-10-CM

## 2017-12-15 DIAGNOSIS — Z7902 Long term (current) use of antithrombotics/antiplatelets: Secondary | ICD-10-CM

## 2017-12-15 LAB — LIPID PANEL
CHOLESTEROL: 150 mg/dL (ref 0–200)
HDL: 43 mg/dL (ref >40)
LDL Cholesterol: 95 mg/dL (ref 0–99)
Total CHOL/HDL Ratio: 3.5 RATIO
Triglycerides: 61 mg/dL (ref <150)
VLDL: 12 mg/dL (ref 0–40)

## 2017-12-15 LAB — COMPREHENSIVE METABOLIC PANEL
ALBUMIN: 3.8 g/dL (ref 3.5–5.0)
ALK PHOS: 74 U/L (ref 38–126)
ALT: 27 U/L (ref 17–63)
ANION GAP: 7 (ref 5–15)
AST: 31 U/L (ref 15–41)
BILIRUBIN TOTAL: 0.7 mg/dL (ref 0.3–1.2)
BUN: 18 mg/dL (ref 6–20)
CALCIUM: 8.5 mg/dL — AB (ref 8.9–10.3)
CO2: 29 mmol/L (ref 22–32)
Chloride: 105 mmol/L (ref 101–111)
Creatinine, Ser: 1.65 mg/dL — ABNORMAL HIGH (ref 0.61–1.24)
GFR calc non Af Amer: 46 mL/min — ABNORMAL LOW (ref >60)
GFR, EST AFRICAN AMERICAN: 53 mL/min — AB (ref >60)
Glucose, Bld: 115 mg/dL — ABNORMAL HIGH (ref 65–99)
POTASSIUM: 3.5 mmol/L (ref 3.5–5.1)
SODIUM: 141 mmol/L (ref 135–145)
TOTAL PROTEIN: 6.5 g/dL (ref 6.5–8.1)

## 2017-12-15 LAB — ECHOCARDIOGRAM COMPLETE
AV Area mean vel: 2.97 cm2
AV Mean grad: 4 mmHg
AV Peak grad: 8 mmHg
AV area mean vel ind: 1.52 cm2/m2
AV vel: 2.94
AVAREAVTI: 3.33 cm2
AVAREAVTIIND: 1.51 cm2/m2
AVCELMEANRAT: 0.56
AVPKVEL: 140 cm/s
Ao pk vel: 0.63 m/s
CHL CUP AV PEAK INDEX: 1.71
CHL CUP DOP CALC LVOT VTI: 15.6 cm
CHL CUP MV DEC (S): 148
CHL CUP RV SYS PRESS: 60 mmHg
CHL CUP STROKE VOLUME: 92 mL
DOP CAL AO MEAN VELOCITY: 95.9 cm/s
EERAT: 13.46
EWDT: 148 ms
FS: 12 % — AB (ref 28–44)
Height: 71 in
IV/PV OW: 1.26
LA diam end sys: 50 mm
LA vol A4C: 121 ml
LA vol: 135 mL
LADIAMINDEX: 2.56 cm/m2
LASIZE: 50 mm
LAVOLIN: 69.1 mL/m2
LDCA: 5.31 cm2
LV E/e' medial: 13.46
LV E/e'average: 13.46
LV dias vol index: 132 mL/m2
LV dias vol: 257 mL — AB (ref 62–150)
LV e' LATERAL: 10.4 cm/s
LV sys vol index: 85 mL/m2
LV sys vol: 165 mL — AB (ref 21–61)
LVOT SV: 83 mL
LVOT peak VTI: 0.55 cm
LVOTD: 26 mm
LVOTPV: 87.7 cm/s
Lateral S' vel: 9.25 cm/s
MRPISAEROA: 0.15 cm2
MV Peak grad: 8 mmHg
MV pk A vel: 79.6 m/s
MV pk E vel: 140 m/s
PW: 10.3 mm — AB (ref 0.6–1.1)
Reg peak vel: 334 cm/s
Simpson's disk: 36
TAPSE: 14.7 mm
TDI e' lateral: 10.4
TDI e' medial: 6.31
TR max vel: 334 cm/s
VTI: 150 cm
VTI: 28.2 cm
Valve area index: 1.51
Valve area: 2.94 cm2
Weight: 2678.4 oz

## 2017-12-15 LAB — I-STAT TROPONIN, ED: TROPONIN I, POC: 0 ng/mL (ref 0.00–0.08)

## 2017-12-15 LAB — CBC WITH DIFFERENTIAL/PLATELET
BASOS PCT: 0 %
Basophils Absolute: 0 10*3/uL (ref 0.0–0.1)
EOS ABS: 0.1 10*3/uL (ref 0.0–0.7)
Eosinophils Relative: 2 %
HEMATOCRIT: 37.1 % — AB (ref 39.0–52.0)
HEMOGLOBIN: 11.9 g/dL — AB (ref 13.0–17.0)
LYMPHS ABS: 1 10*3/uL (ref 0.7–4.0)
Lymphocytes Relative: 26 %
MCH: 26.3 pg (ref 26.0–34.0)
MCHC: 32.1 g/dL (ref 30.0–36.0)
MCV: 81.9 fL (ref 78.0–100.0)
MONO ABS: 0.2 10*3/uL (ref 0.1–1.0)
MONOS PCT: 6 %
NEUTROS PCT: 66 %
Neutro Abs: 2.5 10*3/uL (ref 1.7–7.7)
Platelets: 109 10*3/uL — ABNORMAL LOW (ref 150–400)
RBC: 4.53 MIL/uL (ref 4.22–5.81)
RDW: 15 % (ref 11.5–15.5)
WBC: 3.9 10*3/uL — ABNORMAL LOW (ref 4.0–10.5)

## 2017-12-15 LAB — HEMOGLOBIN A1C
Hgb A1c MFr Bld: 6.2 % — ABNORMAL HIGH (ref 4.8–5.6)
MEAN PLASMA GLUCOSE: 131.24 mg/dL

## 2017-12-15 LAB — TSH: TSH: 0.864 u[IU]/mL (ref 0.350–4.500)

## 2017-12-15 LAB — BRAIN NATRIURETIC PEPTIDE: B Natriuretic Peptide: 2998.3 pg/mL — ABNORMAL HIGH (ref 0.0–100.0)

## 2017-12-15 MED ORDER — HYDRALAZINE HCL 25 MG PO TABS
25.0000 mg | ORAL_TABLET | Freq: Once | ORAL | Status: DC
  Filled 2017-12-15: qty 1

## 2017-12-15 MED ORDER — ISOSORBIDE MONONITRATE ER 60 MG PO TB24
60.0000 mg | ORAL_TABLET | Freq: Two times a day (BID) | ORAL | Status: DC
  Filled 2017-12-15: qty 1

## 2017-12-15 MED ORDER — SPIRONOLACTONE 12.5 MG HALF TABLET
12.5000 mg | ORAL_TABLET | Freq: Every day | ORAL | Status: DC
  Administered 2017-12-15: 12.5 mg via ORAL
  Filled 2017-12-15: qty 1

## 2017-12-15 MED ORDER — FUROSEMIDE 10 MG/ML IJ SOLN
40.0000 mg | Freq: Once | INTRAMUSCULAR | Status: AC
  Administered 2017-12-15: 40 mg via INTRAVENOUS
  Filled 2017-12-15: qty 4

## 2017-12-15 MED ORDER — NITROGLYCERIN 0.4 MG SL SUBL: 0.4000 mg | SUBLINGUAL_TABLET | SUBLINGUAL | Status: DC | PRN

## 2017-12-15 MED ORDER — OXYCODONE HCL 5 MG PO TABS
10.0000 mg | ORAL_TABLET | Freq: Four times a day (QID) | ORAL | Status: DC | PRN
Start: 2017-12-15 — End: 2017-12-16
  Administered 2017-12-15 – 2017-12-16 (×2): 10 mg via ORAL
  Filled 2017-12-15 (×2): qty 2

## 2017-12-15 MED ORDER — ONDANSETRON HCL 4 MG/2ML IJ SOLN: 4.0000 mg | Freq: Four times a day (QID) | INTRAMUSCULAR | Status: DC | PRN

## 2017-12-15 MED ORDER — ISOSORBIDE MONONITRATE ER 60 MG PO TB24
60.0000 mg | ORAL_TABLET | Freq: Two times a day (BID) | ORAL | Status: DC
  Administered 2017-12-15 – 2017-12-16 (×2): 60 mg via ORAL
  Filled 2017-12-15: qty 1

## 2017-12-15 MED ORDER — FUROSEMIDE 10 MG/ML IJ SOLN
80.0000 mg | Freq: Once | INTRAMUSCULAR | Status: AC
  Administered 2017-12-15: 80 mg via INTRAVENOUS
  Filled 2017-12-15: qty 8

## 2017-12-15 MED ORDER — OXYCODONE HCL 5 MG PO TABS
10.0000 mg | ORAL_TABLET | Freq: Once | ORAL | Status: AC
  Administered 2017-12-15: 10 mg via ORAL
  Filled 2017-12-15: qty 2

## 2017-12-15 MED ORDER — ACETAMINOPHEN 325 MG PO TABS: 650.0000 mg | ORAL_TABLET | ORAL | Status: DC | PRN

## 2017-12-15 MED ORDER — HYDRALAZINE HCL 50 MG PO TABS
75.0000 mg | ORAL_TABLET | Freq: Two times a day (BID) | ORAL | Status: DC
  Administered 2017-12-15 – 2017-12-16 (×2): 75 mg via ORAL
  Filled 2017-12-15 (×2): qty 1

## 2017-12-15 MED ORDER — SODIUM CHLORIDE 0.9% FLUSH
3.0000 mL | Freq: Two times a day (BID) | INTRAVENOUS | Status: DC
  Administered 2017-12-15 – 2017-12-16 (×3): 3 mL via INTRAVENOUS

## 2017-12-15 MED ORDER — CLOPIDOGREL BISULFATE 75 MG PO TABS
75.0000 mg | ORAL_TABLET | Freq: Every day | ORAL | Status: DC
  Administered 2017-12-16: 75 mg via ORAL
  Filled 2017-12-15: qty 1

## 2017-12-15 MED ORDER — IPRATROPIUM-ALBUTEROL 0.5-2.5 (3) MG/3ML IN SOLN
3.0000 mL | Freq: Once | RESPIRATORY_TRACT | Status: AC
  Administered 2017-12-15: 3 mL via RESPIRATORY_TRACT
  Filled 2017-12-15: qty 3

## 2017-12-15 MED ORDER — SODIUM CHLORIDE 0.9 % IV SOLN: 250.0000 mL | INTRAVENOUS | Status: DC | PRN

## 2017-12-15 MED ORDER — ENSURE ENLIVE PO LIQD
237.0000 mL | Freq: Two times a day (BID) | ORAL | Status: DC
  Administered 2017-12-15 – 2017-12-16 (×2): 237 mL via ORAL

## 2017-12-15 MED ORDER — SODIUM CHLORIDE 0.9% FLUSH: 3.0000 mL | INTRAVENOUS | Status: DC | PRN

## 2017-12-15 MED ORDER — SACUBITRIL-VALSARTAN 97-103 MG PO TABS
1.0000 | ORAL_TABLET | Freq: Two times a day (BID) | ORAL | Status: DC
  Administered 2017-12-15 – 2017-12-16 (×2): 1 via ORAL
  Filled 2017-12-15 (×2): qty 1

## 2017-12-15 MED ORDER — CARVEDILOL 25 MG PO TABS
25.0000 mg | ORAL_TABLET | Freq: Two times a day (BID) | ORAL | Status: DC
  Administered 2017-12-15 – 2017-12-16 (×2): 25 mg via ORAL
  Filled 2017-12-15 (×2): qty 1

## 2017-12-15 NOTE — ED Notes (Signed)
PT states improved sob

## 2017-12-15 NOTE — ED Provider Notes (Signed)
[facility] EMERGENCY DEPARTMENT Provider Note   CSN: [id number] Arrival date & time: 12/15/17  0509     History   Chief Complaint Chief Complaint  Patient presents with  . Respiratory Distress    CHF    HPI Chad [name] is a 56 y.o. male.  The history is provided by the patient.  He has history of coronary artery disease, congestive heart failure, COPD, renal insufficiency, hypertension, hyperlipidemia, thoracic aortic dissection.  He had onset about 4 hours ago of dyspnea.  Dyspnea is worse when supine.  He has had a cough productive of sputum which he swallows.  He denies fever, chills, sweats.  He did not use his inhaler because he did not feel that this was related to his COPD.  Past Medical History:  Diagnosis Date  . AICD (automatic cardioverter/defibrillator) present   . Anemia   . Anginal pain ([place])   . Aortic dissection, thoracoabdominal ([place])    7/10: Type I s/p repair  . Asthma   . CAD (coronary artery disease)    a. s/p CABG 2006;  b. DES to PDA 2011 (cath: Dx not seen, dRCA/PDA tx with DES; S-PDA occluded (culprit), S-Dx occluded, S-RI and OM ok, L-LAD ok  . Carotid stenosis    dopplers 2011: 0-39% bilat.  . Chest pain syndrome   . CHF (congestive heart failure) ([place])   . Chronic bronchitis ([place])   . Chronic lower back pain   . Chronic systolic heart failure (HCC)    a. 12/13 ECHO: EF 35-40%, sept, apical & posterobasal HK, LV mod dil & sys fx mod reduced, mild AI, MV mild reg, TV mild reg  . Complication of anesthesia    "difficult to wake afterwards a couple of times"  . 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 Dyspnea   . Family history of adverse reaction to anesthesia    "sister hard to wake up"  . GERD (gastroesophageal reflux disease)   . Gout   . Heart murmur   . HLD (hyperlipidemia)   . HTN (hypertension)    severe  . Myocardial infarction [facility])    "many"  (11/07/2016)  . PONV (postoperative nausea and vomiting)   . Sleep apnea    "never RX'd mask" (11/07/2016)  . Thyroid cancer ([place])    [employer]    Patient Active Problem List   Diagnosis Date Noted  . Failure of implantable cardioverter-defibrillator (ICD) lead 11/07/2016  . Dyspnea 05/12/2016  . Erectile dysfunction 04/18/2016  . Pain in the chest   . Aneurysm of descending thoracic aorta ([place])   . Nasal folliculitis 02/54/2706  . Chronic systolic CHF (congestive heart failure) ([place]) 04/12/2015  . CKD (chronic kidney disease) stage 3, GFR 30-59 ml/min (HCC) 04/12/2015  . Unstable angina ([place]) 01/25/2015  . Health care maintenance 03/19/2014  . Local reaction to tetanus vaccine 03/19/2014  . Neck pain on left side 06/03/2013  . Penile discharge 02/26/2013  . Syncope 01/17/2013  . Acute on chronic systolic and diastolic heart failure, NYHA class 3 ([place]) 01/16/2013  . Low back pain 04/23/2012  . Chest pain syndrome   . Insomnia 09/14/2011  . 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 Coronary atherosclerosis of native coronary artery 06/12/2011  . Bradycardia 05/03/2011  . Aortic dissection, thoracoabdominal ([place])   . Chronic kidney disease (CKD), stage II (mild)   . ORTHOSTATIC HYPOTENSION 05/03/2010  .  CAROTID BRUIT 01/25/2010  . PANCYTOPENIA 11/01/2009  . Fatigue 10/25/2009  . DISSECTING AORTIC ANEURYSM THORACOABDOMINAL 07/14/2009  . SCIATICA, RIGHT 05/18/2009  . HYPERLIPIDEMIA-MIXED 09/29/2008  . HYPERTENSION, BENIGN 09/29/2008  . CAD, ARTERY BYPASS GRAFT 09/29/2008  . SYSTOLIC HEART FAILURE, CHRONIC 09/29/2008  . ICD - IN SITU 09/29/2008    Past Surgical History:  Procedure Laterality Date  . BACK SURGERY    . CARDIAC CATHETERIZATION     "several" (11/07/2016)  . CARDIAC DEFIBRILLATOR PLACEMENT  11/2005   [employer]; Archie Endo 05/09/2011  . CORONARY ANGIOPLASTY WITH STENT PLACEMENT     "I've had 1-2 stents" (11/07/2016)  .  CORONARY ARTERY BYPASS GRAFT  06/21/2009   "CABG X2"  . CORONARY ARTERY BYPASS GRAFT  06/29/2005   "CABG X7"  . ICD LEAD REMOVAL  11/07/2016  . ICD LEAD REMOVAL N/A 11/07/2016   Procedure: ICD LEAD REMOVAL, INSERTION OF NEW ICD LEAD;  Surgeon: Evans Lance, MD;  Location: [facility];  Service: Cardiovascular;  Laterality: N/A;  Dr. Prescott Gum to backup case  . IMPLANTABLE CARDIOVERTER DEFIBRILLATOR (ICD) GENERATOR CHANGE N/A 12/13/2012   Procedure: ICD GENERATOR CHANGE;  Surgeon: Evans Lance, MD;  Location: [facility] CATH LAB;  Service: Cardiovascular;  Laterality: N/A;  . LUMBAR [place] SURGERY  01/2001    most recent within 5-10 years  . SHOULDER ARTHROSCOPY WITH ROTATOR CUFF REPAIR Bilateral   . Status post emergency repair of a type A ascending aortic dissection with a hemiarch reconstruction of the ascending aorta  using a 28-mm Hemashield graft with redo sternotomy and revision of previous bypass grafts in June 2010.    . TESTICLE SURGERY    . THYROIDECTOMY, PARTIAL  06/20/2011  . VASECTOMY         Home Medications    Prior to Admission medications   Medication Sig Start Date End Date Taking? Authorizing Provider  albuterol (PROVENTIL HFA;VENTOLIN HFA) 108 (90 Base) MCG/ACT inhaler Inhale 2 puffs into the lungs every 6 (six) hours as needed for wheezing or shortness of breath. 10/19/17   Mikell, Jeani Sow, MD  carvedilol (COREG) 25 MG tablet TAKE ONE TABLET BY MOUTH TWICE DAILY WITH A MEAL 11/09/17   Bensimhon, Shaune Pascal, MD  clopidogrel (PLAVIX) 75 MG tablet TAKE ONE TABLET BY MOUTH DAILY 08/01/17   Bensimhon, Shaune Pascal, MD  cyclobenzaprine (FLEXERIL) 10 MG tablet Take 10 mg by mouth daily as needed for muscle spasms.     [provider]  diphenhydrAMINE (BENADRYL) 25 MG tablet Take 25 mg by mouth every 6 (six) hours as needed for allergies.    [provider]  furosemide (LASIX) 40 MG tablet TAKE 1 TABLET BY MOUTH TWICE DAILY AS NEEDED FOR EDEMA 11/27/17   Bensimhon,  Shaune Pascal, MD  hydrALAZINE (APRESOLINE) 50 MG tablet Take 75 mg by mouth 2 (two) times daily.    [provider]  ibuprofen (ADVIL,MOTRIN) 200 MG tablet Take 200 mg by mouth every 6 (six) hours as needed for moderate pain.    [provider]  isosorbide mononitrate (IMDUR) 60 MG 24 hr tablet TAKE ONE TABLET BY MOUTH TWICE DAILY 12/10/17   Bensimhon, Shaune Pascal, MD  KLOR-CON M10 10 MEQ tablet TAKE ONE TABLET BY MOUTH ONCE DAILY Patient taking differently: TAKE ONE TABLET BY MOUTH ONCE DAILY AS NEEDED FOR SUPPLEMENT 09/04/17   Bensimhon, Shaune Pascal, MD  naphazoline (NAPHCON) 0.1 % ophthalmic solution Place 1 drop into both eyes 4 (four) times daily as needed for irritation.  [provider]  nitroGLYCERIN (NITROSTAT) 0.4 MG SL tablet PLACE ONE TABLET UNDER THE TONGUE EVERY 5 MINUTES FOR 3 DOSES AS NEEDED FOR CHEST PAIN 07/02/17   Bensimhon, Shaune Pascal, MD  Oxycodone HCl 10 MG TABS Take 20 mg by mouth 3 (three) times daily.     [provider]  sacubitril-valsartan (ENTRESTO) 97-103 MG Take 1 tablet by mouth 2 (two) times daily.    [provider]  sildenafil (VIAGRA) 100 MG tablet TAKE 1/2 TABLET BY MOUTH DAILY AS NEEDED FOR ERECTILE DYSFUNCTION 09/24/17   Bensimhon, Shaune Pascal, MD  Spacer/Aero-Holding Chambers (AEROCHAMBER PLUS) inhaler Use as instructed 05/12/16   Olam Idler, MD  spironolactone (ALDACTONE) 25 MG tablet Take 0.5 tablets (12.5 mg total) by mouth daily. Patient taking differently: Take 12.5 mg by mouth at bedtime.  01/31/17   Bensimhon, Shaune Pascal, MD    Family History Family History  Problem Relation Age of Onset  . Hypertension Father   . Heart disease Father   . Early death Father   . COPD Father   . Hypertension Mother   . Coronary artery disease Unknown     Social History Social History   Tobacco Use  . Smoking status: Never Smoker  . Smokeless tobacco: Never Used  Substance Use Topics  . Alcohol use: No  . Drug use: No      Allergies   Iohexol; Lipitor [atorvastatin calcium]; Shellfish allergy; Sulfa antibiotics; Sulfonamide derivatives; Metrizamide; Latex; and Zocor [simvastatin]   Review of Systems Review of Systems  All other systems reviewed and are negative.    Physical Exam Updated Vital Signs BP 126/85 (BP Location: Left Arm)   Pulse 90   Temp 98.8 F (37.1 C) (Oral)   Resp (!) 22   Ht 5\' 11"  (1.803 m)   Wt 81.6 kg (180 lb)   SpO2 96%   BMI 25.10 kg/m   Physical Exam  Nursing note and vitals reviewed.  56 year old male, resting comfortably and in no acute distress. Vital signs are significant for mild tachypnea. Oxygen saturation is 96%, which is normal. Head is normocephalic and atraumatic. PERRLA, EOMI. Oropharynx is clear. Neck is nontender and supple without adenopathy or JVD. Back is nontender and there is no CVA tenderness. Lungs have rales at the right base without overt wheezes or rhonchi.  There is significant wheezing with forced exhalation. Chest is nontender. Heart has regular rate and rhythm without murmur. Abdomen is soft, flat, nontender without masses or hepatosplenomegaly and peristalsis is normoactive. Extremities have no cyanosis or edema, full range of motion is present. Skin is warm and dry without rash. Neurologic: Mental status is normal, cranial nerves are intact, there are no motor or sensory deficits.  ED Treatments / Results  Labs (all labs ordered are listed, but only abnormal results are displayed) Labs Reviewed  CBC WITH DIFFERENTIAL/PLATELET - Abnormal; Notable for the following components:      Result Value   WBC 3.9 (*)    Hemoglobin 11.9 (*)    HCT 37.1 (*)    Platelets 109 (*)    All other components within normal limits  COMPREHENSIVE METABOLIC PANEL - Abnormal; Notable for the following components:   Glucose, Bld 115 (*)    Creatinine, Ser 1.65 (*)    Calcium 8.5 (*)    GFR calc non Af Amer 46 (*)    GFR calc Af Amer 53 (*)    All  other components within normal limits  BRAIN  NATRIURETIC PEPTIDE - Abnormal; Notable for the following components:   B Natriuretic Peptide 2,998.3 (*)    All other components within normal limits  I-STAT TROPONIN, ED    EKG  EKG Interpretation  Date/Time:  Saturday December 15 2017 05:15:41 EST Ventricular Rate:  92 PR Interval:  182 QRS Duration: 106 QT Interval:  374 QTC Calculation: 462 R Axis:   -24 Text Interpretation:  Sinus rhythm with occasional Premature ventricular complexes Low voltage QRS Possible Anterolateral infarct , age undetermined Abnormal ECG When compared with ECG of 10/11/2017, Premature ventricular complexes are now present Confirmed by Delora Fuel ([id number]) on 12/15/2017 5:30:03 AM       Radiology Dg Chest 2 View  Result Date: 12/15/2017 CLINICAL DATA:  Shortness of breath EXAM: CHEST  2 VIEW COMPARISON:  Chest CT 10/11/2017 FINDINGS: Thoracic aortic aneurysm and cardiomegaly are unchanged. There is mild pulmonary edema. No focal airspace consolidation. No pleural effusion or pneumothorax. Left chest wall single lead AICD is in unchanged position. IMPRESSION: Cardiomegaly and thoracic aortic aneurysm, unchanged. Mild pulmonary edema. Electronically Signed   By: Ulyses Jarred M.D.   On: 12/15/2017 06:14    Procedures Procedures (including critical care time)  Medications Ordered in ED Medications  ipratropium-albuterol (DUONEB) 0.5-2.5 (3) MG/3ML nebulizer solution 3 mL (3 mLs Nebulization Given 12/15/17 0543)  furosemide (LASIX) injection 80 mg (80 mg Intravenous Given 12/15/17 0811)     Initial Impression / Assessment and Plan / ED Course  I have reviewed the triage vital signs and the nursing notes.  Pertinent labs & imaging results that were available during my care of the patient were reviewed by me and considered in my medical decision making (see chart for details).  Acute dyspnea.  Possible COPD exacerbation, possible CHF exacerbation,  possible pneumonia.  Chest x-ray has been ordered.  He will be given albuterol with ipratropium via nebulizer.  Screening labs are obtained.  Old records are reviewed, and he has prior ED visits and admissions for COPD and CHF.  Patient felt better after nebulizer treatment, but lungs actually sounded worse.  No overt wheezes, but coarse inspiratory and expiratory rhonchi.  Chest x-ray seems consistent with CHF.  BNP is come back markedly elevated, consistent with CHF.  He is given a dose of furosemide.  However, his blood pressure is borderline and he will need to be observed carefully.  He will need to be admitted for this.  Case is discussed with family practice resident who agrees to admit the patient.  Final Clinical Impressions(s) / ED Diagnoses   Final diagnoses:  Acute on chronic systolic congestive heart failure (HCC)  Renal insufficiency  Normochromic normocytic anemia  Thrombocytopenia [facility])    ED Discharge Orders    None       Delora Fuel, MD 35/00/93 [phone]

## 2017-12-15 NOTE — Progress Notes (Signed)
Pt was complaining of chest pain, 3/10. MD made aware. Pt states that pain is subsiding. Will continue to assess.

## 2017-12-15 NOTE — H&P (Signed)
[facility] Admission History and Physical Service Pager: [phone]  Patient name: Chad [name] Medical record number: [id number] Date of birth: August 10, 1961 Age: 56 y.o. Gender: male  Primary Care Provider: Tonette Bihari, MD Consultants: Cardiology Code Status: Full  Chief Complaint: fluid overload and SOB  Assessment and Plan: Chad [name] is a 56 y.o. male presenting with fluid overload and shortness of breath. PMH is significant for CAD with hx of CABG with stent placement, CHF, COPD, renal insufficiency, HTN, HLD, hx of thoracic aortic dissection.  SOB 2/2 Acute Exacerbation of CHF: Patient presenting with acute shortness of breath last night where he felt as if he was "sloshing" in his chest. On arrival to the ED, his BNP was 2,998.3. CXR showed pulmonary edema and cardiomegaly. Crackles were noted at the bases on exam. Last ECHO was done 01/2017 showing G2DD with an EF of 20-25%. Patient reports compliance with his medications and took twice his regular Lasix dose of 40mg  BID. S/p 80 mg IV lasix and one duoneb treatment in the ED. Reports he is already feeling much better and has had 0.85 L urine output since arrival. Patient reporting he took his home medications this morning prior to arrival.  -Admit to med-surg, attending Dr. Gwendlyn Deutscher -Vitals per unit -Supplemental oxygen to maintain O2 sat greater than 92% -Obtain repeat echocardiogram -Consult cardiology  -Strict I's and O's -Daily weights -Monitor respiratory status -Received 80 mg IV Lasix in ED -BMP, BC in morning -Continue home medications: Imdur 60 mg twice daily, Coreg 25mg  BID, Entresto 97-103 mg  COPD: No wheezing noted on exam. Minimal improvement after breathing treatment. -continue home medications- albuterol -duoneb scheduled q4hrs -albuterol q2hrs prn  -continue treatment as above   CAD s/p CABG with stent placement: 2006 and 2010. Followed closely by his cardiologist.   -continue home medication, plavix 75mg   HTN: Well controlled. Patient reports taking his home medications this morning prior to admission.  -conitnue home medications-Imdur 60 mg twice daily, Coreg 25mg  BID, Entresto 97-103 mg -Monitor BP  Renal Insufficiency: Stable on admission. Baseline Cr appears to be between ~1.3-1.6. with Cr on admission of 1.65.  -Continue to monitor  HLD: -continue home Entresto 97-103 mg  Hx of thoracic aortic dissection: Stable per CXR.  -Follows along closely with his cardiologist  FEN/GI: heart healthy Prophylaxis: SCD's  Disposition: Admit to med-surg  History of Present Illness:  Chad [name] is a 56 y.o. male presenting with "carrying too much fluid". States he is short of breath and it has been ongoing. He has previously been able to manage it, but when he was laying down, he was unable to clear fluids. Chad [name] it started just a few hours prior to coming to the ED, around 3 am. Patient laid down at 7pm and was fine and woke up at midnight and was fine but after laying back down, he felt fluid coming and tried to cough to clear but it didn't work. Felt as if his heart was sloshing. Patient reports compliance with medications at home, 40 mg Lasix BID, but he can take more. He said he took twice as much yesterday to help with fluid. Hasn't felt good for a few weeks, and he couldn't carry on a conversation without coughing.   In the ED patient received 80 mg IV lasix and one duoneb treatment. CXR was significant for cardiomegaly, stable thoracic aneurysm, and pulmonary edema.   Review Of Systems: Per HPI with the following additions:   Review of  Systems  Constitutional: Negative for chills and fever.  HENT: Negative for congestion.   Respiratory: Positive for cough and shortness of breath.   Cardiovascular: Negative for chest pain and leg swelling.  Gastrointestinal: Negative for abdominal pain.  Genitourinary: Positive for frequency.    Patient  Active Problem List   Diagnosis Date Noted  . Failure of implantable cardioverter-defibrillator (ICD) lead 11/07/2016  . Dyspnea 05/12/2016  . Erectile dysfunction 04/18/2016  . Pain in the chest   . Aneurysm of descending thoracic aorta ([place])   . Nasal folliculitis 92/42/6834  . Chronic systolic CHF (congestive heart failure) ([place]) 04/12/2015  . CKD (chronic kidney disease) stage 3, GFR 30-59 ml/min (HCC) 04/12/2015  . Unstable angina ([place]) 01/25/2015  . Health care maintenance 03/19/2014  . Local reaction to tetanus vaccine 03/19/2014  . Neck pain on left side 06/03/2013  . Penile discharge 02/26/2013  . Syncope 01/17/2013  . Acute on chronic systolic and diastolic heart failure, NYHA class 3 ([place]) 01/16/2013  . Low back pain 04/23/2012  . Chest pain syndrome   . Insomnia 09/14/2011  . 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 Coronary atherosclerosis of native coronary artery 06/12/2011  . Bradycardia 05/03/2011  . Aortic dissection, thoracoabdominal ([place])   . Chronic kidney disease (CKD), stage II (mild)   . ORTHOSTATIC HYPOTENSION 05/03/2010  . CAROTID BRUIT 01/25/2010  . PANCYTOPENIA 11/01/2009  . Fatigue 10/25/2009  . DISSECTING AORTIC ANEURYSM THORACOABDOMINAL 07/14/2009  . SCIATICA, RIGHT 05/18/2009  . HYPERLIPIDEMIA-MIXED 09/29/2008  . HYPERTENSION, BENIGN 09/29/2008  . CAD, ARTERY BYPASS GRAFT 09/29/2008  . SYSTOLIC HEART FAILURE, CHRONIC 09/29/2008  . ICD - IN SITU 09/29/2008    Past Medical History: Past Medical History:  Diagnosis Date  . AICD (automatic cardioverter/defibrillator) present   . Anemia   . Anginal pain ([place])   . Aortic dissection, thoracoabdominal ([place])    7/10: Type I s/p repair  . Asthma   . CAD (coronary artery disease)    a. s/p CABG 2006;  b. DES to PDA 2011 (cath: Dx not seen, dRCA/PDA tx with DES; S-PDA occluded (culprit), S-Dx occluded, S-RI and OM ok, L-LAD ok  . Carotid stenosis    dopplers 2011:  0-39% bilat.  . Chest pain syndrome   . CHF (congestive heart failure) ([place])   . Chronic bronchitis ([place])   . Chronic lower back pain   . Chronic systolic heart failure (HCC)    a. 12/13 ECHO: EF 35-40%, sept, apical & posterobasal HK, LV mod dil & sys fx mod reduced, mild AI, MV mild reg, TV mild reg  . Complication of anesthesia    "difficult to wake afterwards a couple of times"  . 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 Dyspnea   . Family history of adverse reaction to anesthesia    "sister hard to wake up"  . GERD (gastroesophageal reflux disease)   . Gout   . Heart murmur   . HLD (hyperlipidemia)   . HTN (hypertension)    severe  . Myocardial infarction [facility])    "many" (11/07/2016)  . PONV (postoperative nausea and vomiting)   . Sleep apnea    "never RX'd mask" (11/07/2016)  . Thyroid cancer ([place])    Hertle Cell    Past Surgical History: Past Surgical History:  Procedure Laterality Date  . BACK SURGERY    . CARDIAC CATHETERIZATION     "several" (11/07/2016)  .  CARDIAC DEFIBRILLATOR PLACEMENT  11/2005   [employer]; Archie Endo 05/09/2011  . CORONARY ANGIOPLASTY WITH STENT PLACEMENT     "I've had 1-2 stents" (11/07/2016)  . CORONARY ARTERY BYPASS GRAFT  06/21/2009   "CABG X2"  . CORONARY ARTERY BYPASS GRAFT  06/29/2005   "CABG X7"  . ICD LEAD REMOVAL  11/07/2016  . ICD LEAD REMOVAL N/A 11/07/2016   Procedure: ICD LEAD REMOVAL, INSERTION OF NEW ICD LEAD;  Surgeon: Evans Lance, MD;  Location: [facility];  Service: Cardiovascular;  Laterality: N/A;  Dr. Prescott Gum to backup case  . IMPLANTABLE CARDIOVERTER DEFIBRILLATOR (ICD) GENERATOR CHANGE N/A 12/13/2012   Procedure: ICD GENERATOR CHANGE;  Surgeon: Evans Lance, MD;  Location: [facility] C[facility] LAB;  Service: Cardiovascular;  Laterality: N/A;  . LUMBAR [place] SURGERY  01/2001    most recent within 5-10 years  . SHOULDER ARTHROSCOPY WITH ROTATOR CUFF REPAIR Bilateral    . Status post emergency repair of a type A ascending aortic dissection with a hemiarch reconstruction of the ascending aorta  using a 28-mm Hemashield graft with redo sternotomy and revision of previous bypass grafts in June 2010.    . TESTICLE SURGERY    . THYROIDECTOMY, PARTIAL  06/20/2011  . VASECTOMY      Social History: Social History   Tobacco Use  . Smoking status: Never Smoker  . Smokeless tobacco: Never Used  Substance Use Topics  . Alcohol use: No  . Drug use: No   Additional social history:   Please also refer to relevant sections of EMR.  Family History: Family History  Problem Relation Age of Onset  . Hypertension Father   . Heart disease Father   . Early death Father   . COPD Father   . Hypertension Mother   . Coronary artery disease Unknown    Allergies and Medications: Allergies  Allergen Reactions  . Iohexol Anaphylaxis and Other (See Comments)     Desc: PT HAS ANAPHYLAXIS WITH CONTRAST MEDIA!  . Lipitor [Atorvastatin Calcium] Anaphylaxis and Other (See Comments)    Large doses  . Shellfish Allergy Anaphylaxis  . Sulfa Antibiotics Shortness Of Breath and Swelling  . Sulfonamide Derivatives Shortness Of Breath and Swelling  . Metrizamide Swelling  . Latex Rash    With long periods of exposure  . Zocor [Simvastatin] Other (See Comments)    Muscle cramps   No current facility-administered medications on file prior to encounter.    Current Outpatient Medications on File Prior to Encounter  Medication Sig Dispense Refill  . albuterol (PROVENTIL HFA;VENTOLIN HFA) 108 (90 Base) MCG/ACT inhaler Inhale 2 puffs into the lungs every 6 (six) hours as needed for wheezing or shortness of breath. 1 Inhaler 0  . carvedilol (COREG) 25 MG tablet TAKE ONE TABLET BY MOUTH TWICE DAILY WITH A MEAL 60 tablet 6  . clopidogrel (PLAVIX) 75 MG tablet TAKE ONE TABLET BY MOUTH DAILY 90 tablet 3  . cyclobenzaprine (FLEXERIL) 10 MG tablet Take 10 mg by mouth daily as needed  for muscle spasms.     . diphenhydrAMINE (BENADRYL) 25 MG tablet Take 25 mg by mouth every 6 (six) hours as needed for allergies.    . furosemide (LASIX) 40 MG tablet TAKE 1 TABLET BY MOUTH TWICE DAILY AS NEEDED FOR EDEMA 60 tablet 6  . hydrALAZINE (APRESOLINE) 50 MG tablet Take 75 mg by mouth 2 (two) times daily.    [name] ibuprofen (ADVIL,MOTRIN) 200 MG tablet Take 200 mg by mouth every 6 (six)  hours as needed for moderate pain.    . isosorbide mononitrate (IMDUR) 60 MG 24 hr tablet TAKE ONE TABLET BY MOUTH TWICE DAILY 60 tablet 6  . KLOR-CON M10 10 MEQ tablet TAKE ONE TABLET BY MOUTH ONCE DAILY (Patient taking differently: TAKE ONE TABLET BY MOUTH ONCE DAILY AS NEEDED FOR SUPPLEMENT) 30 tablet 6  . naphazoline (NAPHCON) 0.1 % ophthalmic solution Place 1 drop into both eyes 4 (four) times daily as needed for irritation.    . nitroGLYCERIN (NITROSTAT) 0.4 MG SL tablet PLACE ONE TABLET UNDER THE TONGUE EVERY 5 MINUTES FOR 3 DOSES AS NEEDED FOR CHEST PAIN 25 tablet 12  . Oxycodone HCl 10 MG TABS Take 20 mg by mouth 3 (three) times daily.     . sacubitril-valsartan (ENTRESTO) 97-103 MG Take 1 tablet by mouth 2 (two) times daily.    . sildenafil (VIAGRA) 100 MG tablet TAKE 1/2 TABLET BY MOUTH DAILY AS NEEDED FOR ERECTILE DYSFUNCTION 10 tablet 0  . Spacer/Aero-Holding Chambers (AEROCHAMBER PLUS) inhaler Use as instructed 1 each 0  . spironolactone (ALDACTONE) 25 MG tablet Take 0.5 tablets (12.5 mg total) by mouth daily. (Patient taking differently: Take 12.5 mg by mouth at bedtime. ) 30 tablet 6    Objective: BP 118/76   Pulse 73   Temp 98.8 F (37.1 C) (Oral)   Resp 12   Ht 5\' 11"  (1.803 m)   Wt 180 lb (81.6 kg)   SpO2 91%   BMI 25.10 kg/m  Exam: General: NAD, pleasant Eyes: PERRL, EOMI, no conjunctival pallor or injection ENTM: Moist mucous membranes, no pharyngeal erythema or exudate Neck: Supple, JVD noted  Cardiovascular: RRR, systolic murmur, trace LE edema Respiratory: CTA in BL upper  lobes with crackles noted in bases, normal work of breathing Gastrointestinal: soft, nontender, nondistended, normoactive BS MSK: moves 4 extremities equally Derm: no rashes appreciated Psych: A&O, appropriate affect  Labs and Imaging: CBC BMET  Recent Labs  Lab 12/15/17 0518  WBC 3.9*  HGB 11.9*  HCT 37.1*  PLT 109*   Recent Labs  Lab 12/15/17 0518  NA 141  K 3.5  CL 105  CO2 29  BUN 18  CREATININE 1.65*  GLUCOSE 115*  CALCIUM 8.5*     BNP 2,998 Troponin 0.00 Dg Chest 2 View  Result Date: 12/15/2017 CLINICAL DATA:  Shortness of breath EXAM: CHEST  2 VIEW COMPARISON:  Chest CT 10/11/2017 FINDINGS: Thoracic aortic aneurysm and cardiomegaly are unchanged. There is mild pulmonary edema. No focal airspace consolidation. No pleural effusion or pneumothorax. Left chest wall single lead AICD is in unchanged position. IMPRESSION: Cardiomegaly and thoracic aortic aneurysm, unchanged. Mild pulmonary edema. Electronically Signed   By: Ulyses Jarred M.D.   On: 12/15/2017 06:14   ECHO 02/02/2017:  Study Conclusions - Left ventricle: The cavity size was severely dilated. Systolic   function was severely reduced. The estimated ejection fraction   was in the range of 20% to 25%. Severe diffuse hypokinesis with   distinct regional wall motion abnormalities. There is akinesis of   the entireinferolateral and inferior myocardium. Features are   consistent with a pseudonormal left ventricular filling pattern,   with concomitant abnormal relaxation and increased filling   pressure (grade 2 diastolic dysfunction). Doppler parameters are   consistent with high ventricular filling pressure. - Aortic valve: Trileaflet; mildly thickened, mildly calcified   leaflets. There was moderate regurgitation. Regurgitation   pressure half-time: 376 ms. - Aorta: Aortic root dimension: 50 mm (ED). - Aortic  root: The aortic root was severely dilated. - Mitral valve: There was moderate to severe  regurgitation directed   centrally. Effective regurgitant orifice (PISA): 0.36 cm^2.   Regurgitant volume (PISA): 53 ml. - Left atrium: The atrium was moderately to severely dilated.   Anterior-posterior dimension: 52 mm. Volume/bsa, ES, (1-plane   Simpson&'s, A2C): 47.2 ml/m^2. - Right ventricle: Systolic function was moderately reduced. - Right atrium: The atrium was mildly dilated. - Tricuspid valve: There was moderate regurgitation. - Pulmonary arteries: PA peak pressure: 51 mm Hg (S).  Shalom Mcguiness, [place], DO 12/15/2017, 8:51 AM PGY-1, [place] Intern pager: [phone], text pages welcome

## 2017-12-15 NOTE — Progress Notes (Signed)
Chad [name] is a 56 y.o. male patient admitted from ED awake, alert - oriented  X 4 - no acute distress noted.  VSS - Blood pressure 123/87, pulse 92, temperature 98.5 F (36.9 C), temperature source Oral, resp. rate 18, height 5\' 11"  (1.803 m), weight 75.9 kg (167 lb 6.4 oz), SpO2 96 %.    IV in place, occlusive dsg intact without redness.  Orientation to room, and floor completed with information packet given to patient/family.  Patient declined safety video at this time.  Admission INP armband ID verified with patient/family, and in place.   SR up x 2, fall assessment complete, with patient and family able to verbalize understanding of risk associated with falls, and verbalized understanding to call nsg before up out of bed.  Call light within reach, patient able to voice, and demonstrate understanding.  Skin, clean-dry- intact without evidence of bruising, or skin tears.   No evidence of skin break down noted on exam.     Will cont to eval and treat per MD orders.  Celine Ahr, RN 12/15/2017 2:24 PM

## 2017-12-15 NOTE — ED Provider Notes (Signed)
Pt signed out by Dr. Roxanne Mins pending [place] residency call back.  I did speak with FP resident who will come see pt for admission.   Isla Pence, MD 12/15/17 [phone]

## 2017-12-15 NOTE — Progress Notes (Signed)
Received report on pt.

## 2017-12-15 NOTE — Consult Note (Signed)
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   We saw him in 2/18 for acute visit due to increasing dyspnea and volume overload. ECHO EF 20-25% moderate RV dysfunction. Moderate AI and severe MR. Ao Root 71mm-> 32mm.   I referred him to Dr. 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 He was referred to [facility] Transplant team for further evaluation in 6/18 and felt to be too early for transplant based on CPX.  Over the past few months has been doing quite well. Had an ER visit in 10/18 for chest pain but troponins were negative and he was sent home.   Over past few days has had increasing DOE and ab bloating as well as orthopnea or PND. Doubled up on his home lasix but no response. Came to ED. CXR with mild CHF. BNP 2,998 (previous 834).Got 1 dose of 80 IV lasix with excellent response and breathing better. Denies CP or other triggers to HF. Denies missing medications or excessive dietary non-compliance.    Review of Systems: [y] = yes, [ ]  = no   General: Weight gain Blue.Reese ]; Weight loss [ ] ; Anorexia [ ] ; Fatigue [ ] ; Fever [ ] ; Chills [ ] ; Weakness [ ]   Cardiac: Chest pain/pressure [ ] ; Resting SOB Blue.Reese ]; Exertional SOB Blue.Reese ]; Orthopnea Blue.Reese ];  Pedal Edema [ ] ; Palpitations [ ] ; Syncope [ ] ; Presyncope [ ] ; Paroxysmal nocturnal dyspnea[y ]  Pulmonary: Cough [ ] ; Wheezing[ ] ; Hemoptysis[ ] ; Sputum [ ] ; Snoring [ ]   GI: Vomiting[ ] ; Dysphagia[ ] ; Melena[ ] ; Hematochezia [ ] ; Heartburn[ ] ; Abdominal pain [ ] ; Constipation [ ] ; Diarrhea [ ] ; BRBPR [ ]   GU: Hematuria[ ] ; Dysuria [ ] ; Nocturia[ ]   Vascular: Pain in legs with walking [ ] ; Pain in feet with lying flat [ ] ; Non-healing sores [ ] ; Stroke [ ] ; TIA [ ] ; Slurred speech [ ] ;  Neuro: Headaches[ ] ; Vertigo[ ] ; Seizures[ ] ; Paresthesias[ ] ;Blurred vision [ ] ; Diplopia [ ] ; Vision changes [ ]   Ortho/Skin: Arthritis Blue.Reese ]; Joint pain [Y ]; Muscle pain [ ] ; Joint swelling [ ] ; Back Pain [ ] ; Rash [ ]   Psych: Depression[ ] ; Anxiety[ Y]  Heme: Bleeding problems [ ] ; Clotting disorders [ ] ; Anemia [ ]   Endocrine: Diabetes [ ] ; Thyroid dysfunction[Y ]  Home Medications Prior to Admission medications   Medication Sig Start Date End Date Taking? Authorizing Provider  albuterol (PROVENTIL HFA;VENTOLIN HFA) 108 (90 Base) MCG/ACT inhaler Inhale 2 puffs into the lungs every 6 (six) hours as needed for wheezing or shortness of breath. 05/12/16  Yes Olam Idler, MD  carvedilol (COREG) 25 MG tablet Take 1 tablet (25 mg total) by  mouth 2 (two) times daily with a meal. 01/31/17  Yes Bensimhon, Shaune Pascal, MD  clopidogrel (PLAVIX) 75 MG tablet TAKE ONE TABLET BY MOUTH DAILY 08/01/17  Yes Bensimhon, Shaune Pascal, MD  cyclobenzaprine (FLEXERIL) 10 MG tablet Take 10 mg by mouth daily as needed for muscle spasms.    Yes [provider]  diphenhydrAMINE (BENADRYL) 25 MG tablet Take 25 mg by mouth every 6 (six) hours as needed for allergies.   Yes [provider]  furosemide (LASIX) 40 MG tablet Take 1 tablet (40 mg total) by mouth 2 (two) times daily as needed for edema. Patient taking differently: Take 40 mg by mouth 2 (two) times daily as needed for fluid.  01/31/17  Yes Bensimhon, Shaune Pascal, MD    hydrALAZINE (APRESOLINE) 50 MG tablet Take 75 mg by mouth 2 (two) times daily.   Yes [provider]  ibuprofen (ADVIL,MOTRIN) 200 MG tablet Take 200 mg by mouth every 6 (six) hours as needed for moderate pain.   Yes [provider]  isosorbide mononitrate (IMDUR) 60 MG 24 hr tablet Take 1 tablet (60 mg total) by mouth 2 (two) times daily. 01/31/17  Yes Bensimhon, Shaune Pascal, MD  KLOR-CON M10 10 MEQ tablet TAKE ONE TABLET BY MOUTH ONCE DAILY Patient taking differently: TAKE ONE TABLET BY MOUTH ONCE DAILY AS NEEDED FOR SUPPLEMENT 09/04/17  Yes Bensimhon, Shaune Pascal, MD  naphazoline (NAPHCON) 0.1 % ophthalmic solution Place 1 drop into both eyes 4 (four) times daily as needed for irritation.   Yes [provider]  nitroGLYCERIN (NITROSTAT) 0.4 MG SL tablet PLACE ONE TABLET UNDER THE TONGUE EVERY 5 MINUTES FOR 3 DOSES AS NEEDED FOR CHEST PAIN 07/02/17  Yes Bensimhon, Shaune Pascal, MD  Oxycodone HCl 10 MG TABS Take 20 mg by mouth 3 (three) times daily.    Yes [provider]  sacubitril-valsartan (ENTRESTO) 97-103 MG Take 1 tablet by mouth 2 (two) times daily.   Yes [provider]  sildenafil (VIAGRA) 100 MG tablet TAKE 1/2 TABLET BY MOUTH DAILY AS NEEDED FOR ERECTILE DYSFUNCTION 09/24/17  Yes Bensimhon, Shaune Pascal, MD  Spacer/Aero-Holding Chambers (AEROCHAMBER PLUS) inhaler Use as instructed 05/12/16  Yes Olam Idler, MD  spironolactone (ALDACTONE) 25 MG tablet Take 0.5 tablets (12.5 mg total) by mouth daily. Patient taking differently: Take 12.5 mg by mouth at bedtime.  01/31/17  Yes Bensimhon, Shaune Pascal, MD    Past Medical History: Past Medical History:  Diagnosis Date  . AICD (automatic cardioverter/defibrillator) present   . Anemia   . Anginal pain ([place])   . Aortic dissection, thoracoabdominal ([place])    7/10: Type I s/p repair  . Asthma   . CAD (coronary artery disease)    a. s/p CABG 2006;  b. DES to PDA 2011 (cath: Dx not seen, dRCA/PDA tx with DES; S-PDA  occluded (culprit), S-Dx occluded, S-RI and OM ok, L-LAD ok  . Carotid stenosis    dopplers 2011: 0-39% bilat.  . Chest pain syndrome   . CHF (congestive heart failure) ([place])   . Chronic bronchitis ([place])   . Chronic lower back pain   . Chronic systolic heart failure (HCC)    a. 12/13 ECHO: EF 35-40%, sept, apical & posterobasal HK, LV mod dil & sys fx mod reduced, mild AI, MV mild reg, TV mild reg  . Complication of anesthesia    "difficult to wake afterwards a couple of times"  . CRI (chronic renal insufficiency)    "one kidney  is gone; the other is hanging on" (11/07/2016)  . Dyspnea   . Family history of adverse reaction to anesthesia    "sister hard to wake up"  . GERD (gastroesophageal reflux disease)   . Gout   . Heart murmur   . HLD (hyperlipidemia)   . HTN (hypertension)    severe  . Myocardial infarction [facility])    "many" (11/07/2016)  . PONV (postoperative nausea and vomiting)   . Sleep apnea    "never RX'd mask" (11/07/2016)  . Thyroid cancer ([place])    Hertle Cell    Past Surgical History: Past Surgical History:  Procedure Laterality Date  . BACK SURGERY    . CARDIAC CATHETERIZATION     "several" (11/07/2016)  . CARDIAC DEFIBRILLATOR PLACEMENT  11/2005   [employer]; Archie Endo 05/09/2011  . CORONARY ANGIOPLASTY WITH STENT PLACEMENT     "I've had 1-2 stents" (11/07/2016)  . CORONARY ARTERY BYPASS GRAFT  06/21/2009   "CABG X2"  . CORONARY ARTERY BYPASS GRAFT  06/29/2005   "CABG X7"  . ICD LEAD REMOVAL  11/07/2016  . ICD LEAD REMOVAL N/A 11/07/2016   Procedure: ICD LEAD REMOVAL, INSERTION OF NEW ICD LEAD;  Surgeon: Evans Lance, MD;  Location: [place];  Service: Cardiovascular;  Laterality: N/A;  Dr. Prescott Gum to backup case  . IMPLANTABLE CARDIOVERTER DEFIBRILLATOR (ICD) GENERATOR CHANGE N/A 12/13/2012   Procedure: ICD GENERATOR CHANGE;  Surgeon: Evans Lance, MD;  Location: [facility] CATH LAB;  Service: Cardiovascular;  Laterality: N/A;  . LUMBAR [place] SURGERY   01/2001    most recent within 5-10 years  . SHOULDER ARTHROSCOPY WITH ROTATOR CUFF REPAIR Bilateral   . Status post emergency repair of a type A ascending aortic dissection with a hemiarch reconstruction of the ascending aorta  using a 28-mm Hemashield graft with redo sternotomy and revision of previous bypass grafts in June 2010.    . TESTICLE SURGERY    . THYROIDECTOMY, PARTIAL  06/20/2011  . VASECTOMY      Family History: Family History  Problem Relation Age of Onset  . Hypertension Father   . Heart disease Father   . Early death Father   . COPD Father   . Hypertension Mother   . Coronary artery disease Unknown     Social History: Social History   Socioeconomic History  . Marital status: Divorced    Spouse name: None  . Number of children: None  . Years of education: None  . Highest education level: None  Social Needs  . Financial resource strain: None  . Food insecurity - worry: None  . Food insecurity - inability: None  . Transportation needs - medical: None  . Transportation needs - non-medical: None  Occupational History  . Occupation: disabled  Tobacco Use  . Smoking status: Never Smoker  . Smokeless tobacco: Never Used  Substance and Sexual Activity  . Alcohol use: No  . Drug use: No  . Sexual activity: Yes  Other Topics Concern  . None  Social History Narrative   Engaged and lives with fiancee and 4 kids.    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     Allergies:  Allergies  Allergen Reactions  . Iohexol Anaphylaxis and Other (See Comments)     Desc: PT HAS ANAPHYLAXIS WITH CONTRAST MEDIA!  . Lipitor [Atorvastatin Calcium] Anaphylaxis and Other (See Comments)    Large doses  . Shellfish Allergy Anaphylaxis  . Sulfa Antibiotics Shortness Of Breath and Swelling  .  Sulfonamide Derivatives Shortness Of Breath and Swelling  . Metrizamide Swelling  . Latex Rash    With long periods of exposure  . Zocor [Simvastatin] Other (See Comments)     Muscle cramps    Objective:    Vital Signs:   Temp:  [98.5 F (36.9 C)-98.8 F (37.1 C)] 98.5 F (36.9 C) (12/22 1422) Pulse Rate:  [73-92] 92 (12/22 1422) Resp:  [12-28] 18 (12/22 1422) BP: (99-127)/(55-88) 99/55 (12/22 1620) SpO2:  [91 %-100 %] 96 % (12/22 1422) Weight:  [75.9 kg (167 lb 6.4 oz)-81.6 kg (180 lb)] 75.9 kg (167 lb 6.4 oz) (12/22 1422) Last BM Date: 12/15/17  Weight change: Filed Weights   12/15/17 0516 12/15/17 1422  Weight: 81.6 kg (180 lb) 75.9 kg (167 lb 6.4 oz)    Intake/Output:   Intake/Output Summary (Last 24 hours) at 12/15/2017 1907 Last data filed at 12/15/2017 1824 Gross per 24 hour  Intake 355 ml  Output 2825 ml  Net -2470 ml      Physical Exam    General:  Sitting up in bed Well appearing. No resp difficulty HEENT: normal Neck: supple. JVP 9  Carotids 2+ bilat; no bruits. No lymphadenopathy or thryomegaly appreciated. Cor: PMI laterally displaced. Regular rate & rhythm. High pitched SEM at RUSB with 2/6 AI  2/6 MR Lungs: basilar crackles Abdomen: soft, nontender, mildly distended. No hepatosplenomegaly. No bruits or masses. Good bowel sounds. Extremities: no cyanosis, clubbing, rash, edema Neuro: alert & orientedx3, cranial nerves grossly intact. moves all 4 extremities w/o difficulty. Affect pleasant    Telemetry   NSR 80-90s personally reviewed   EKG    NSR 92 bpm with occasional PVCs no acute ST-T wave changes. Personally reviewed   Labs   Basic Metabolic Panel: Recent Labs  Lab 12/15/17 0518  NA 141  K 3.5  CL 105  CO2 29  GLUCOSE 115*  BUN 18  CREATININE 1.65*  CALCIUM 8.5*    Liver Function Tests: Recent Labs  Lab 12/15/17 0518  AST 31  ALT 27  ALKPHOS 74  BILITOT 0.7  PROT 6.5  ALBUMIN 3.8   No results for input(s): LIPASE, AMYLASE in the last 168 hours. No results for input(s): AMMONIA in the last 168 hours.  CBC: Recent Labs  Lab 12/15/17 0518  WBC 3.9*  NEUTROABS 2.5  HGB 11.9*  HCT  37.1*  MCV 81.9  PLT 109*    Cardiac Enzymes: No results for input(s): CKTOTAL, CKMB, CKMBINDEX, TROPONINI in the last 168 hours.  BNP: BNP (last 3 results) Recent Labs    02/09/17 1532 09/19/17 0944 12/15/17 0518  BNP 413.8* 834.8* 3,662.9*    ProBNP (last 3 results) No results for input(s): PROBNP in the last 8760 hours.   CBG: No results for input(s): GLUCAP in the last 168 hours.  Coagulation Studies: No results for input(s): LABPROT, INR in the last 72 hours.   Imaging   Dg Chest 2 View  Result Date: 12/15/2017 CLINICAL DATA:  Shortness of breath EXAM: CHEST  2 VIEW COMPARISON:  Chest CT 10/11/2017 FINDINGS: Thoracic aortic aneurysm and cardiomegaly are unchanged. There is mild pulmonary edema. No focal airspace consolidation. No pleural effusion or pneumothorax. Left chest wall single lead AICD is in unchanged position. IMPRESSION: Cardiomegaly and thoracic aortic aneurysm, unchanged. Mild pulmonary edema. Electronically Signed   By: Ulyses Jarred M.D.   On: 12/15/2017 06:14     Medications:     . carvedilol  25 mg Oral BID WC  . [  START ON 12/16/2017] clopidogrel  75 mg Oral Daily  . feeding supplement (ENSURE ENLIVE)  237 mL Oral BID BM  . hydrALAZINE  25 mg Oral Once  . hydrALAZINE  75 mg Oral BID  . isosorbide mononitrate  60 mg Oral BID  . sacubitril-valsartan  1 tablet Oral BID  . sodium chloride flush  3 mL Intravenous Q12H  . 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    Assessment/Plan   1. A/C systolic HF due to ischemic CM - He is volume overloaded. Denies missing medications. Suspect possible dietary non-compliance - No evidence of ischemia as trigger - Echo reviewed personally  from today. EF 20-25% moderate AI, ao root 44cm, mod to severe MR - Continue home HF meds - Agree with lasix 80 IV bid. D/w FPTS personally  2. CAD s/p CABG - no evidence of ischemia. Continue ASA, plavix and statin  3. H/O Type 1 Aortic Dissection  - s/p repair. Currently stable   4. CKD III  in the setting of solitary kidney  - Todays creatinine stable at 1.6  5. HTN - Blood pressure well controlled. Continue current regimen.   6. Aortic root dilation with moderate AI - Ao Root stable at 4.4cm on echo   7. Moderate to severe mitral regurgitation - likely secondary MR. Continue treatment of HF.  Length of Stay: 0  Glori Bickers, MD  12/15/2017, 7:07 PM  Advanced Heart Failure Team Pager [phone] (M-F; 7a - 4p)  Please contact [facility] for night-coverage after hours (4p -7a ) and weekends on [email or web address]

## 2017-12-15 NOTE — ED Triage Notes (Signed)
Patient reports worsening SOB with chest congestion and productive cough onset this morning history of CHF /CAD his cardiologist is Dr. Missy Sabins .

## 2017-12-15 NOTE — ED Notes (Signed)
Diet tray ordered 

## 2017-12-15 NOTE — ED Notes (Signed)
Pt taken to xray 

## 2017-12-15 NOTE — Progress Notes (Signed)
*  PRELIMINARY RESULTS* Echocardiogram 2D Echocardiogram has been performed.  Chad [name] 12/15/2017, 3:27 PM

## 2017-12-16 ENCOUNTER — Encounter (HOSPITAL_COMMUNITY): Payer: Self-pay

## 2017-12-16 DIAGNOSIS — I5023 Acute on chronic systolic (congestive) heart failure: Secondary | ICD-10-CM

## 2017-12-16 DIAGNOSIS — N289 Disorder of kidney and ureter, unspecified: Secondary | ICD-10-CM

## 2017-12-16 DIAGNOSIS — E876 Hypokalemia: Secondary | ICD-10-CM

## 2017-12-16 LAB — BASIC METABOLIC PANEL WITH GFR
Anion gap: 7 (ref 5–15)
BUN: 19 mg/dL (ref 6–20)
CO2: 29 mmol/L (ref 22–32)
Calcium: 8.5 mg/dL — ABNORMAL LOW (ref 8.9–10.3)
Chloride: 103 mmol/L (ref 101–111)
Creatinine, Ser: 1.5 mg/dL — ABNORMAL HIGH (ref 0.61–1.24)
GFR calc Af Amer: 59 mL/min — ABNORMAL LOW
GFR calc non Af Amer: 51 mL/min — ABNORMAL LOW
Glucose, Bld: 129 mg/dL — ABNORMAL HIGH (ref 65–99)
Potassium: 3.1 mmol/L — ABNORMAL LOW (ref 3.5–5.1)
Sodium: 139 mmol/L (ref 135–145)

## 2017-12-16 LAB — HIV ANTIBODY (ROUTINE TESTING W REFLEX): HIV Screen 4th Generation wRfx: NONREACTIVE

## 2017-12-16 MED ORDER — POTASSIUM CHLORIDE CRYS ER 20 MEQ PO TBCR
40.0000 meq | EXTENDED_RELEASE_TABLET | Freq: Once | ORAL | Status: AC
  Administered 2017-12-16: 40 meq via ORAL
  Filled 2017-12-16: qty 2

## 2017-12-16 NOTE — Progress Notes (Signed)
Advanced Heart Failure Rounding Note   Subjective:    Feels good. Diuresed well with IV lasix. Weight down 11 pounds overnight. Breathing much better. No CP, orthopnea or PND. Wants to go home.   Creatinine stable. K being supped  Objective:   Weight Range:  Vital Signs:   Temp:  [98.1 F (36.7 C)-98.5 F (36.9 C)] 98.1 F (36.7 C) (12/23 0500) Pulse Rate:  [77-92] 77 (12/23 0500) Resp:  [17-26] 20 (12/23 0500) BP: (99-127)/(55-87) 127/85 (12/23 0905) SpO2:  [96 %-100 %] 99 % (12/23 0500) Weight:  [70.9 kg (156 lb 4.9 oz)-75.9 kg (167 lb 6.4 oz)] 70.9 kg (156 lb 4.9 oz) (12/23 0424) Last BM Date: 12/15/17  Weight change: Filed Weights   12/15/17 0516 12/15/17 1422 12/16/17 0424  Weight: 81.6 kg (180 lb) 75.9 kg (167 lb 6.4 oz) 70.9 kg (156 lb 4.9 oz)    Intake/Output:   Intake/Output Summary (Last 24 hours) at 12/16/2017 0931 Last data filed at 12/16/2017 0427 Gross per 24 hour  Intake 355 ml  Output 2675 ml  Net -2320 ml     Physical Exam: General:  Well appearing. No resp difficulty HEENT: normal Neck: supple. JVP 6-7 . Carotids 2+ bilat; no bruits. No lymphadenopathy or thryomegaly appreciated. Cor: PMI laterally displaced. Regular rate & rhythm. 2/6 AI and MR Lungs: clear Abdomen: soft, nontender, nondistended. No hepatosplenomegaly. No bruits or masses. Good bowel sounds. Extremities: no cyanosis, clubbing, rash, edema Neuro: alert & orientedx3, cranial nerves grossly intact. moves all 4 extremities w/o difficulty. Affect pleasant  Telemetry: SR  Labs: Basic Metabolic Panel: Recent Labs  Lab 12/15/17 0518 12/16/17 0416  NA 141 139  K 3.5 3.1*  CL 105 103  CO2 29 29  GLUCOSE 115* 129*  BUN 18 19  CREATININE 1.65* 1.50*  CALCIUM 8.5* 8.5*    Liver Function Tests: Recent Labs  Lab 12/15/17 0518  AST 31  ALT 27  ALKPHOS 74  BILITOT 0.7  PROT 6.5  ALBUMIN 3.8   No results for input(s): LIPASE, AMYLASE in the last 168 hours. No  results for input(s): AMMONIA in the last 168 hours.  CBC: Recent Labs  Lab 12/15/17 0518  WBC 3.9*  NEUTROABS 2.5  HGB 11.9*  HCT 37.1*  MCV 81.9  PLT 109*    Cardiac Enzymes: No results for input(s): CKTOTAL, CKMB, CKMBINDEX, TROPONINI in the last 168 hours.  BNP: BNP (last 3 results) Recent Labs    02/09/17 1532 09/19/17 0944 12/15/17 0518  BNP 413.8* 834.8* 2,297.9*    ProBNP (last 3 results) No results for input(s): PROBNP in the last 8760 hours.    Other results:  Imaging: Dg Chest 2 View  Result Date: 12/15/2017 CLINICAL DATA:  Shortness of breath EXAM: CHEST  2 VIEW COMPARISON:  Chest CT 10/11/2017 FINDINGS: Thoracic aortic aneurysm and cardiomegaly are unchanged. There is mild pulmonary edema. No focal airspace consolidation. No pleural effusion or pneumothorax. Left chest wall single lead AICD is in unchanged position. IMPRESSION: Cardiomegaly and thoracic aortic aneurysm, unchanged. Mild pulmonary edema. Electronically Signed   By: Ulyses Jarred M.D.   On: 12/15/2017 06:14      Medications:     Scheduled Medications: . carvedilol  25 mg Oral BID WC  . clopidogrel  75 mg Oral Daily  . feeding supplement (ENSURE ENLIVE)  237 mL Oral BID BM  . hydrALAZINE  25 mg Oral Once  . hydrALAZINE  75 mg Oral BID  . isosorbide mononitrate  60 mg Oral BID  . sacubitril-valsartan  1 tablet Oral BID  . sodium chloride flush  3 mL Intravenous Q12H  . spironolactone  12.5 mg Oral QHS     Infusions: . 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    Plan/Discussion:     1. A/C systolic HF  due to ischemic CM - Volume improved. Can go home today on previous regimen. Encouraged closer use of sliding scale lasix (start earlier) - No evidence of ischemia as trigger - Echo reviewed personally from today. EF 20-25% moderate AI, ao root 44cm, mod to severe MR - Continue home HF meds  2. CAD s/p CABG - no evidence of ischemia. Continue ASA, plavix and statin  3. H/O Type 1 Aortic Dissection  - s/p repair. Currently stable   4. CKD III  in the setting of solitary kidney  - Todays creatinine stable at 1.6->1.5  5. HTN - Blood pressure well controlled. Continue current regimen.   6. Aortic root dilation with moderate AI - Ao Root stable at 4.4cm on echo   7. Moderate to severe mitral regurgitation - likely secondary MR. Continue treatment of HF.  8. Hypokalemia - being supped  9. OSA - cannot tolerate CPAP  Dispo: Home today. We will arrange f/u in HF Clinic. Will need repeat CPX to restratify for advanced therapy     Length of Stay: [place] 12/16/2017, 9:31 AM  Advanced Heart Failure Team Pager [phone] (M-F; 7a - 4p)  Please contact [facility] for night-coverage after hours (4p -7a ) and weekends on [email or web address]

## 2017-12-16 NOTE — Progress Notes (Signed)
Chad [name] to be D/C'd Home per MD order.  Discussed with the patient and all questions fully answered.  VSS, Skin clean, dry and intact without evidence of skin break down, no evidence of skin tears noted. IV catheter discontinued intact. Site without signs and symptoms of complications. Dressing and pressure applied.  An After Visit Summary was printed and given to the patient. Patient received prescription.  D/c education completed with patient/family including follow up instructions, medication list, d/c activities limitations if indicated, with other d/c instructions as indicated by MD - patient able to verbalize understanding, all questions fully answered.   Patient instructed to return to ED, call 911, or call MD for any changes in condition.   Patient refused to be escorted out and D/C'd home via private auto.  [place] 12/16/2017 12:53 PM

## 2017-12-16 NOTE — Discharge Summary (Signed)
[facility] Discharge Summary  Patient name: Chad [name] Medical record number: [id number] Date of birth: [birth year] Age: 56 y.o. Gender: male Date of Admission: 12/15/2017 Date of Discharge:12/16/2017 Admitting Physician: Kinnie Feil, MD  Primary Care Provider: Tonette Bihari, MD Consultants: Cardiology (HF team)  Indication for Hospitalization: Dyspnea and cough   Discharge Diagnoses/Problem List:  Acute CHF exacerbation HTN CAD CKD HLD COPD  Disposition: Home  Discharge Condition: Stable  Discharge Exam: As per Dr. Andy Gauss on 12/16/17  General: NAD, pleasant, able to participate in exam Cardiac: RRR, normal heart sounds, no murmurs. 2+ radial and PT pulses bilaterally Respiratory: CTAB, normal effort, No wheezes, rales or rhonchi Abdomen: soft, nontender, nondistended, no hepatic or splenomegaly, +BS Extremities: no edema or cyanosis. WWP. 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 On admission BNP was ~3000, with CXR showing cardiomegaly and pulmonary edema consistent with acute CHF exacerbation. Patient was given 80 mg IV lasix with good diuresis and significant improvement in respiratory status. Patient also complained of mild chest pain but trop were negative and EKG tracing was unremarkable. Creatinine was at baseline at 1.6 and electrolytes were within normal  Patient was subsequently admitted for further management of exacerbation. Patient was seen by Dr. Haroldine Laws who has taken care of him over  the years and recommended additional diuresis. Patient received an additional 80 mg IV lasix with good urine output overnight (-3L in the past 24h). Patient had a repeat Echo which were similar to prior study and showed en EF 20%-25% with mod AI and mod/sev MR and elevated PA pressure. On discharge day, patient had essentially returned to baseline with almost complete resolution of symptoms. Patient was stable for discharge with close follow up with cardiology in the outpatient setting. Of note A1c was 6.2 and lipid panel was within normal limits.   Issues for Follow Up:  1. Continue current HF meds regimen, follow with HF team in the outpatient setting. 2. Resume potassium supplementation 3. A1c 6.2, patient would need to follow with PCP to discuss prediabetes/ diabetes? Management.  TLC vs oral agent.  Significant Procedures: None  Significant Labs and Imaging:  Recent Labs  Lab 12/15/17 0518  WBC 3.9*  HGB 11.9*  HCT 37.1*  PLT 109*   Recent Labs  Lab 12/15/17 0518 12/16/17 0416  NA 141 139  K 3.5 3.1*  CL 105 103  CO2 29 29  GLUCOSE 115* 129*  BUN 18 19  CREATININE 1.65* 1.50*  CALCIUM 8.5* 8.5*  ALKPHOS 74  --   AST 31  --   ALT 27  --   ALBUMIN 3.8  --    Dg Chest 2 View  Result Date: 12/15/2017 CLINICAL DATA:  Shortness of breath EXAM: CHEST  2 VIEW COMPARISON:  Chest CT 10/11/2017 FINDINGS: Thoracic aortic aneurysm and cardiomegaly are unchanged. There is mild pulmonary edema. No focal airspace consolidation. No pleural effusion or pneumothorax. Left chest wall single lead AICD is in unchanged position. IMPRESSION: Cardiomegaly and thoracic aortic aneurysm, unchanged. Mild pulmonary edema.  Electronically Signed   By: Ulyses Jarred M.D.   On: 12/15/2017 06:14   Results/Tests Pending at Time of Discharge: None  Discharge Medications:  Allergies as of 12/16/2017      Reactions   Iohexol Anaphylaxis, Other (See Comments)    Desc: PT HAS ANAPHYLAXIS WITH CONTRAST  MEDIA!   Lipitor [atorvastatin Calcium] Anaphylaxis, Other (See Comments)   Large doses   Shellfish Allergy Anaphylaxis   Sulfa Antibiotics Shortness Of Breath, Swelling   Sulfonamide Derivatives Shortness Of Breath, Swelling   Metrizamide Swelling   Latex Rash   With long periods of exposure   Zocor [simvastatin] Other (See Comments)   Muscle cramps      Medication List    TAKE these medications   AEROCHAMBER PLUS inhaler Use as instructed   albuterol 108 (90 Base) MCG/ACT inhaler Commonly known as:  PROVENTIL HFA;VENTOLIN HFA Inhale 2 puffs into the lungs every 6 (six) hours as needed for wheezing or shortness of breath.   carvedilol 25 MG tablet Commonly known as:  COREG TAKE ONE TABLET BY MOUTH TWICE DAILY WITH A MEAL   clopidogrel 75 MG tablet Commonly known as:  PLAVIX TAKE ONE TABLET BY MOUTH DAILY   cyclobenzaprine 10 MG tablet Commonly known as:  FLEXERIL Take 10 mg by mouth daily as needed for muscle spasms.   diphenhydrAMINE 25 MG tablet Commonly known as:  BENADRYL Take 25 mg by mouth every 6 (six) hours as needed for allergies.   ENTRESTO 97-103 MG Generic drug:  sacubitril-valsartan Take 1 tablet by mouth 2 (two) times daily.   furosemide 40 MG tablet Commonly known as:  LASIX TAKE 1 TABLET BY MOUTH TWICE DAILY AS NEEDED FOR EDEMA   hydrALAZINE 50 MG tablet Commonly known as:  APRESOLINE Take 75 mg by mouth 2 (two) times daily.   ibuprofen 200 MG tablet Commonly known as:  ADVIL,MOTRIN Take 200 mg by mouth every 6 (six) hours as needed for moderate pain.   isosorbide mononitrate 60 MG 24 hr tablet Commonly known as:  IMDUR TAKE ONE TABLET BY MOUTH TWICE DAILY   KLOR-CON M10 10 MEQ tablet Generic drug:  potassium chloride TAKE ONE TABLET BY MOUTH ONCE DAILY What changed:    how much to take  how to take this  when to take this   naphazoline 0.1 % ophthalmic solution Commonly known as:  NAPHCON Place 1 drop into both eyes 4 (four)  times daily as needed for irritation.   nitroGLYCERIN 0.4 MG SL tablet Commonly known as:  NITROSTAT PLACE ONE TABLET UNDER THE TONGUE EVERY 5 MINUTES FOR 3 DOSES AS NEEDED FOR CHEST PAIN   Oxycodone HCl 10 MG Tabs Take 20 mg by mouth 3 (three) times daily.   sildenafil 100 MG tablet Commonly known as:  VIAGRA TAKE 1/2 TABLET BY MOUTH DAILY AS NEEDED FOR ERECTILE DYSFUNCTION   spironolactone 25 MG tablet Commonly known as:  ALDACTONE Take 0.5 tablets (12.5 mg total) by mouth daily. What changed:  when to take this       Discharge Instructions: Please refer to Patient Instructions section of EMR for full details.  Patient was counseled important signs and symptoms that should prompt return to medical care, changes in medications, dietary instructions, activity restrictions, and follow up appointments.   Follow-Up Appointments: Follow-up Information    Bensimhon, Shaune Pascal, MD. Go to.   Specialty:  Cardiology Why:  Dr. Clayborne Dana office should call you for an appt.   If you do not  hear from them, please call his office to confirm.  Contact information: [address] [place] [address] [phone]        Lovenia Kim, MD. Go on 12/20/2017.   Specialty:  Family Medicine Why:  8:30am for hospital followup with your primary care office. Contact information: [place] [address] [phone]           Follow-up Information    Bensimhon, Shaune Pascal, MD. Go to.   Specialty:  Cardiology Why:  Dr. Clayborne Dana office should call you for an appt.   If you do not hear from them, please call his office to confirm.  Contact information: [address] [place] [address] [phone]        Lovenia Kim, MD. Go on 12/20/2017.   Specialty:  Family Medicine Why:  8:30am for hospital followup with your primary care office. Contact information: [place] [address] [phone]            Bufford Lope, DO 12/16/2017, 11:46 AM PGY-2, [place]

## 2017-12-20 ENCOUNTER — Ambulatory Visit: Payer: Medicaid Other | Admitting: Family Medicine

## 2017-12-20 ENCOUNTER — Encounter: Payer: Self-pay | Admitting: Family Medicine

## 2017-12-20 ENCOUNTER — Other Ambulatory Visit: Payer: Self-pay

## 2017-12-20 DIAGNOSIS — J9801 Acute bronchospasm: Secondary | ICD-10-CM | POA: Diagnosis not present

## 2017-12-20 DIAGNOSIS — R7303 Prediabetes: Secondary | ICD-10-CM

## 2017-12-20 DIAGNOSIS — I509 Heart failure, unspecified: Secondary | ICD-10-CM | POA: Diagnosis present

## 2017-12-20 MED ORDER — POTASSIUM CHLORIDE CRYS ER 10 MEQ PO TBCR: 10.0000 meq | EXTENDED_RELEASE_TABLET | Freq: Every day | ORAL | 6 refills | Status: DC

## 2017-12-20 MED ORDER — IPRATROPIUM-ALBUTEROL 0.5-2.5 (3) MG/3ML IN SOLN: 3.0000 mL | Freq: Four times a day (QID) | RESPIRATORY_TRACT | 3 refills | Status: DC | PRN

## 2017-12-20 NOTE — Patient Instructions (Signed)
You were seen in clinic for a hospital follow-up and I am glad you are doing better.  You did not appear fluid overloaded today and your heart failure appears to be under control so I have not made any changes to your Lasix or other medications.  I would like for you to make sure you follow up with the heart failure clinic on 01/09/2018 as scheduled.  Additionally, I have sent in a prescription for your breathing treatment solution and we provided you with the equipment today.  If you have any questions, please call clinic.    Be well,  Lovenia Kim, MD

## 2017-12-20 NOTE — Assessment & Plan Note (Signed)
Patient discharged on 12/16/2017 from [facility], asymptomatic today and doing well on current regimen.  No signs of fluid overload on exam and lung exam is benign.   Has follow up appointment as outpatient with HF (Dr. Haroldine Laws) on 01/09/2017.   -continue current medication regimen  -advised to keep f/u appt with HF for further management

## 2017-12-20 NOTE — Progress Notes (Signed)
   Subjective:   Patient ID: Chad [name]    DOB: 12-03-1961, 56 y.o. male   MRN: [id number]  CC: hospital f/u   HPI: Chad [name] is a 56 y.o. male who presents to clinic today for hospital follow-up.  CHF exacerbation Reports he is doing well since discharge on 12/16/2017.  He denies leg swelling, dyspnea, chest pain, palpitations.  He reports he has had a dry, nonproductive cough on and off but otherwise no issues.  Current heart failure regimen includes Lasix, Coreg, Entresto, Hydralazine and Imdur.  Reports good medication compliance and does not miss does.  He has an appointment to follow up with Heart Failure clinic as outpatient on 01/09/2018 with Dr. Haroldine Laws.  He has been taking his potassium supplementation as prescribed.  He requests nebulizer equiment to help with his breathing at home as he felt this greatly helped him during his hospitalization.    ROS: Denies fevers, chills, nausea, vomiting, chest pain, palpitations.  Denies SOB, dizziness.    [place]: Pertinent past medical, surgical, family, and social history were reviewed and updated as appropriate. Smoking status reviewed. Medications reviewed. Objective:   BP 114/78   Pulse 83   Temp 98.7 F (37.1 C) (Oral)   Ht 5\' 11"  (1.803 m)   Wt 173 lb 12.8 oz (78.8 kg)   SpO2 96%   BMI 24.24 kg/m  Vitals and nursing note reviewed.  General: 56 yo male, NAD  HEENT: EOMI, PERRL, MMM, o/p clear  Neck: supple, nontender CV: RRR no MRG  Lungs: CTAB, no crackles or wheeze appreciated, normal effort  Abdomen: soft, NTND, +bs  Skin: warm, dry, no rash  Extremities: no edema noted   Assessment & Plan:   CHF exacerbation ([place]) Patient discharged on 12/16/2017 from [facility] hospital, asymptomatic today and doing well on current regimen.  No signs of fluid overload on exam and lung exam is benign.   Has follow up appointment as outpatient with HF (Dr. Haroldine Laws) on 01/09/2017.   -continue current medication regimen  -advised to  keep f/u appt with HF for further management   Prediabetes Patient with Hgb A1c 6.2 on hospital admission.   -pt will need to follow up with PCP to discuss further management and to monitor  - counseled on diet and exercise today  Dyspnea No shortness of breath today.  Requests nebulizer equipment for home as he feels like this helped him while he was admitted.  -DME nebulizer compressor provided -Rx for duoneb sent   Meds ordered this encounter  Medications  . potassium chloride (KLOR-CON M10) 10 MEQ tablet    Sig: Take 1 tablet (10 mEq total) by mouth daily.    Dispense:  30 tablet    Refill:  6    Please consider 90 day supplies to promote better adherence  . ipratropium-albuterol (DUONEB) 0.5-2.5 (3) MG/3ML SOLN    Sig: Take 3 mLs by nebulization every 6 (six) hours as needed.    Dispense:  360 mL    Refill:  3   Follow up: 2 weeks  Lovenia Kim, MD [place], PGY-2 12/20/2017 11:22 AM

## 2017-12-20 NOTE — Assessment & Plan Note (Addendum)
Patient with Hgb A1c 6.2 on hospital admission.   -pt will need to follow up with PCP to discuss further management and to monitor  - counseled on diet and exercise today

## 2017-12-20 NOTE — Assessment & Plan Note (Signed)
No shortness of breath today.  Requests nebulizer equipment for home as he feels like this helped him while he was admitted.  -DME nebulizer compressor provided -Rx for duoneb sent

## 2017-12-25 DIAGNOSIS — G4733 Obstructive sleep apnea (adult) (pediatric): Secondary | ICD-10-CM | POA: Diagnosis not present

## 2017-12-25 DIAGNOSIS — I10 Essential (primary) hypertension: Secondary | ICD-10-CM | POA: Diagnosis not present

## 2017-12-25 DIAGNOSIS — I259 Chronic ischemic heart disease, unspecified: Secondary | ICD-10-CM | POA: Diagnosis not present

## 2017-12-25 DIAGNOSIS — D649 Anemia, unspecified: Secondary | ICD-10-CM | POA: Diagnosis not present

## 2017-12-25 DIAGNOSIS — M109 Gout, unspecified: Secondary | ICD-10-CM | POA: Diagnosis not present

## 2018-01-01 ENCOUNTER — Other Ambulatory Visit (HOSPITAL_COMMUNITY): Payer: Self-pay | Admitting: Internal Medicine

## 2018-01-01 MED ORDER — SILDENAFIL CITRATE 100 MG PO TABS: ORAL_TABLET | ORAL | 0 refills | Status: DC

## 2018-01-06 ENCOUNTER — Other Ambulatory Visit (HOSPITAL_COMMUNITY): Payer: Self-pay | Admitting: Cardiology

## 2018-01-09 ENCOUNTER — Encounter (HOSPITAL_COMMUNITY): Payer: Medicaid Other

## 2018-01-25 DIAGNOSIS — M109 Gout, unspecified: Secondary | ICD-10-CM | POA: Diagnosis not present

## 2018-01-25 DIAGNOSIS — D649 Anemia, unspecified: Secondary | ICD-10-CM | POA: Diagnosis not present

## 2018-01-25 DIAGNOSIS — G4733 Obstructive sleep apnea (adult) (pediatric): Secondary | ICD-10-CM | POA: Diagnosis not present

## 2018-01-25 DIAGNOSIS — I10 Essential (primary) hypertension: Secondary | ICD-10-CM | POA: Diagnosis not present

## 2018-01-25 DIAGNOSIS — I259 Chronic ischemic heart disease, unspecified: Secondary | ICD-10-CM | POA: Diagnosis not present

## 2018-01-28 ENCOUNTER — Emergency Department (HOSPITAL_COMMUNITY): Payer: Medicaid Other

## 2018-01-28 ENCOUNTER — Other Ambulatory Visit: Payer: Self-pay

## 2018-01-28 ENCOUNTER — Emergency Department (HOSPITAL_COMMUNITY)
Admission: EM | Admit: 2018-01-28 | Discharge: 2018-01-28 | Disposition: A | Discharge status: Home / Self Care | Payer: Medicaid Other | Attending: Emergency Medicine | Admitting: Emergency Medicine

## 2018-01-28 ENCOUNTER — Encounter (HOSPITAL_COMMUNITY): Payer: Self-pay

## 2018-01-28 DIAGNOSIS — I130 Hypertensive heart and chronic kidney disease with heart failure and stage 1 through stage 4 chronic kidney disease, or unspecified chronic kidney disease: Secondary | ICD-10-CM | POA: Diagnosis not present

## 2018-01-28 DIAGNOSIS — Z950 Presence of cardiac pacemaker: Secondary | ICD-10-CM | POA: Insufficient documentation

## 2018-01-28 DIAGNOSIS — Z7902 Long term (current) use of antithrombotics/antiplatelets: Secondary | ICD-10-CM | POA: Diagnosis not present

## 2018-01-28 DIAGNOSIS — N183 Chronic kidney disease, stage 3 (moderate): Secondary | ICD-10-CM | POA: Diagnosis not present

## 2018-01-28 DIAGNOSIS — Z79899 Other long term (current) drug therapy: Secondary | ICD-10-CM | POA: Diagnosis not present

## 2018-01-28 DIAGNOSIS — I10 Essential (primary) hypertension: Secondary | ICD-10-CM

## 2018-01-28 DIAGNOSIS — I5042 Chronic combined systolic (congestive) and diastolic (congestive) heart failure: Secondary | ICD-10-CM | POA: Diagnosis not present

## 2018-01-28 DIAGNOSIS — R110 Nausea: Secondary | ICD-10-CM | POA: Diagnosis not present

## 2018-01-28 DIAGNOSIS — I2510 Atherosclerotic heart disease of native coronary artery without angina pectoris: Secondary | ICD-10-CM | POA: Insufficient documentation

## 2018-01-28 DIAGNOSIS — Z951 Presence of aortocoronary bypass graft: Secondary | ICD-10-CM | POA: Diagnosis not present

## 2018-01-28 LAB — CBC
HEMATOCRIT: 32.7 % — AB (ref 39.0–52.0)
Hemoglobin: 10.8 g/dL — ABNORMAL LOW (ref 13.0–17.0)
MCH: 26.5 pg (ref 26.0–34.0)
MCHC: 33 g/dL (ref 30.0–36.0)
MCV: 80.1 fL (ref 78.0–100.0)
Platelets: 147 10*3/uL — ABNORMAL LOW (ref 150–400)
RBC: 4.08 MIL/uL — ABNORMAL LOW (ref 4.22–5.81)
RDW: 14.6 % (ref 11.5–15.5)
WBC: 4 10*3/uL (ref 4.0–10.5)

## 2018-01-28 LAB — BASIC METABOLIC PANEL
Anion gap: 10 (ref 5–15)
BUN: 18 mg/dL (ref 6–20)
CO2: 23 mmol/L (ref 22–32)
Calcium: 8.3 mg/dL — ABNORMAL LOW (ref 8.9–10.3)
Chloride: 103 mmol/L (ref 101–111)
Creatinine, Ser: 1.55 mg/dL — ABNORMAL HIGH (ref 0.61–1.24)
GFR calc Af Amer: 57 mL/min — ABNORMAL LOW (ref >60)
GFR calc non Af Amer: 49 mL/min — ABNORMAL LOW (ref >60)
GLUCOSE: 120 mg/dL — AB (ref 65–99)
POTASSIUM: 3.5 mmol/L (ref 3.5–5.1)
Sodium: 136 mmol/L (ref 135–145)

## 2018-01-28 LAB — I-STAT TROPONIN, ED
TROPONIN I, POC: 0.01 ng/mL (ref 0.00–0.08)
Troponin i, poc: 0.01 ng/mL (ref 0.00–0.08)

## 2018-01-28 NOTE — ED Provider Notes (Signed)
[facility] EMERGENCY DEPARTMENT Provider Note   CSN: [id number] Arrival date & time: 01/28/18  1457     History   Chief Complaint Chief Complaint  Patient presents with  . Nausea    HPI Chad [name] is a 57 y.o. male.  HPI  Patient with history of coronary artery disease, defibrillator, CHF, hypertension, chronic renal insufficiency presenting with complaint of nausea.  He states that in general his blood pressure is elevated he feels nauseated.  He had taken his blood pressure twice this morning and had normal readings and has been taking all of his medications as prescribed.  Prior to calling EMS he felt nauseated and had dry heaves.  He took nitroglycerin and he states this nearly immediately resolved his symptoms.  At the time of his nausea his diastolic blood pressure was 100.  He denies have any chest pain shortness of breath leg swelling.  No feelings of lightheadedness or syncope.  He states he has no complaints at this time.  There are no other associated systemic symptoms, there are no other alleviating or modifying factors.   Past Medical History:  Diagnosis Date  . AICD (automatic cardioverter/defibrillator) present   . Anemia   . Anginal pain ([place])   . Aortic dissection, thoracoabdominal ([place])    7/10: Type I s/p repair  . Asthma   . CAD (coronary artery disease)    a. s/p CABG 2006;  b. DES to PDA 2011 (cath: Dx not seen, dRCA/PDA tx with DES; S-PDA occluded (culprit), S-Dx occluded, S-RI and OM ok, L-LAD ok  . Carotid stenosis    dopplers 2011: 0-39% bilat.  . Chest pain syndrome   . CHF (congestive heart failure) ([place])   . Chronic bronchitis ([place])   . Chronic lower back pain   . Chronic systolic heart failure (HCC)    a. 12/13 ECHO: EF 35-40%, sept, apical & posterobasal HK, LV mod dil & sys fx mod reduced, mild AI, MV mild reg, TV mild reg  . Complication of anesthesia    "difficult to wake afterwards a couple of times"  . CRI (chronic  renal insufficiency)    "one kidney is gone; the other is hanging on" (11/07/2016)  . Dyspnea   . Family history of adverse reaction to anesthesia    "sister hard to wake up"  . GERD (gastroesophageal reflux disease)   . Gout   . Heart murmur   . HLD (hyperlipidemia)   . HTN (hypertension)    severe  . Myocardial infarction [facility])    "many" (11/07/2016)  . PONV (postoperative nausea and vomiting)   . Sleep apnea    "never RX'd mask" (11/07/2016)  . Thyroid cancer ([place])    [employer]    Patient Active Problem List   Diagnosis Date Noted  . Prediabetes 12/20/2017  . Renal insufficiency   . Hypokalemia   . CHF exacerbation ([place]) 12/15/2017  . Failure of implantable cardioverter-defibrillator (ICD) lead 11/07/2016  . Dyspnea 05/12/2016  . Erectile dysfunction 04/18/2016  . Pain in the chest   . Aneurysm of descending thoracic aorta ([place])   . Nasal folliculitis 37/16/9678  . Chronic systolic CHF (congestive heart failure) ([place]) 04/12/2015  . CKD (chronic kidney disease) stage 3, GFR 30-59 ml/min (HCC) 04/12/2015  . Unstable angina ([place]) 01/25/2015  . Health care maintenance 03/19/2014  . Local reaction to tetanus vaccine 03/19/2014  . Neck pain on left side 06/03/2013  . Penile discharge 02/26/2013  . Syncope 01/17/2013  .  Acute on chronic systolic and diastolic heart failure, NYHA class 3 ([place]) 01/16/2013  . Low back pain 04/23/2012  . Chest pain syndrome   . Insomnia 09/14/2011  . 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 Coronary atherosclerosis of native coronary artery 06/12/2011  . Bradycardia 05/03/2011  . Aortic dissection, thoracoabdominal ([place])   . Chronic kidney disease (CKD), stage II (mild)   . ORTHOSTATIC HYPOTENSION 05/03/2010  . CAROTID BRUIT 01/25/2010  . PANCYTOPENIA 11/01/2009  . Fatigue 10/25/2009  . DISSECTING AORTIC ANEURYSM THORACOABDOMINAL 07/14/2009  . SCIATICA, RIGHT 05/18/2009  . HYPERLIPIDEMIA-MIXED 09/29/2008   . HYPERTENSION, BENIGN 09/29/2008  . CAD, ARTERY BYPASS GRAFT 09/29/2008  . SYSTOLIC HEART FAILURE, CHRONIC 09/29/2008  . ICD - IN SITU 09/29/2008    Past Surgical History:  Procedure Laterality Date  . BACK SURGERY    . CARDIAC CATHETERIZATION     "several" (11/07/2016)  . CARDIAC DEFIBRILLATOR PLACEMENT  11/2005   [employer]; Archie Endo 05/09/2011  . CORONARY ANGIOPLASTY WITH STENT PLACEMENT     "I've had 1-2 stents" (11/07/2016)  . CORONARY ARTERY BYPASS GRAFT  06/21/2009   "CABG X2"  . CORONARY ARTERY BYPASS GRAFT  06/29/2005   "CABG X7"  . ICD LEAD REMOVAL  11/07/2016  . ICD LEAD REMOVAL N/A 11/07/2016   Procedure: ICD LEAD REMOVAL, INSERTION OF NEW ICD LEAD;  Surgeon: Evans Lance, MD;  Location: [facility];  Service: Cardiovascular;  Laterality: N/A;  Dr. Prescott Gum to backup case  . IMPLANTABLE CARDIOVERTER DEFIBRILLATOR (ICD) GENERATOR CHANGE N/A 12/13/2012   Procedure: ICD GENERATOR CHANGE;  Surgeon: Evans Lance, MD;  Location: [facility] CATH LAB;  Service: Cardiovascular;  Laterality: N/A;  . LUMBAR [place] SURGERY  01/2001    most recent within 5-10 years  . SHOULDER ARTHROSCOPY WITH ROTATOR CUFF REPAIR Bilateral   . Status post emergency repair of a type A ascending aortic dissection with a hemiarch reconstruction of the ascending aorta  using a 28-mm Hemashield graft with redo sternotomy and revision of previous bypass grafts in June 2010.    . TESTICLE SURGERY    . THYROIDECTOMY, PARTIAL  06/20/2011  . VASECTOMY         Home Medications    Prior to Admission medications   Medication Sig Start Date End Date Taking? Authorizing Provider  albuterol (PROVENTIL HFA;VENTOLIN HFA) 108 (90 Base) MCG/ACT inhaler Inhale 2 puffs into the lungs every 6 (six) hours as needed for wheezing or shortness of breath. 10/19/17   Mikell, Jeani Sow, MD  carvedilol (COREG) 25 MG tablet TAKE ONE TABLET BY MOUTH TWICE DAILY WITH A MEAL 11/09/17   Bensimhon, Shaune Pascal, MD  clopidogrel  (PLAVIX) 75 MG tablet TAKE ONE TABLET BY MOUTH DAILY 08/01/17   Bensimhon, Shaune Pascal, MD  cyclobenzaprine (FLEXERIL) 10 MG tablet Take 10 mg by mouth daily as needed for muscle spasms.     [provider]  diphenhydrAMINE (BENADRYL) 25 MG tablet Take 25 mg by mouth every 6 (six) hours as needed for allergies.    [provider]  ENTRESTO 97-103 MG TAKE 1 TABLET BY MOUTH TWICE DAILY 01/07/18   Larey Dresser, MD  furosemide (LASIX) 40 MG tablet TAKE 1 TABLET BY MOUTH TWICE DAILY AS NEEDED FOR EDEMA 11/27/17   Bensimhon, Shaune Pascal, MD  hydrALAZINE (APRESOLINE) 50 MG tablet Take 75 mg by mouth 2 (two) times daily.    [provider]  ibuprofen (ADVIL,MOTRIN) 200 MG tablet Take 200 mg by mouth  every 6 (six) hours as needed for moderate pain.    [provider]  ipratropium-albuterol (DUONEB) 0.5-2.5 (3) MG/3ML SOLN Take 3 mLs by nebulization every 6 (six) hours as needed. 12/20/17   Lovenia Kim, MD  isosorbide mononitrate (IMDUR) 60 MG 24 hr tablet TAKE ONE TABLET BY MOUTH TWICE DAILY 12/10/17   Bensimhon, Shaune Pascal, MD  naphazoline (NAPHCON) 0.1 % ophthalmic solution Place 1 drop into both eyes 4 (four) times daily as needed for irritation.    [provider]  nitroGLYCERIN (NITROSTAT) 0.4 MG SL tablet PLACE ONE TABLET UNDER THE TONGUE EVERY 5 MINUTES FOR 3 DOSES AS NEEDED FOR CHEST PAIN 07/02/17   Bensimhon, Shaune Pascal, MD  Oxycodone HCl 10 MG TABS Take 20 mg by mouth 3 (three) times daily.     [provider]  potassium chloride (KLOR-CON M10) 10 MEQ tablet Take 1 tablet (10 mEq total) by mouth daily. 12/20/17   Lovenia Kim, MD  sacubitril-valsartan (ENTRESTO) 97-103 MG Take 1 tablet by mouth 2 (two) times daily.    [provider]  sildenafil (VIAGRA) 100 MG tablet TAKE 1/2 (ONE-HALF) TABLET BY MOUTH DAILY AS NEEDED FOR ERECTILE DYSFUNCTION 01/01/18   Bensimhon, Shaune Pascal, MD  Spacer/Aero-Holding Chambers (AEROCHAMBER PLUS) inhaler Use as  instructed 05/12/16   Olam Idler, MD  spironolactone (ALDACTONE) 25 MG tablet Take 0.5 tablets (12.5 mg total) by mouth daily. Patient taking differently: Take 12.5 mg by mouth at bedtime.  01/31/17   Bensimhon, Shaune Pascal, MD    Family History Family History  Problem Relation Age of Onset  . Hypertension Father   . Heart disease Father   . Early death Father   . COPD Father   . Hypertension Mother   . Coronary artery disease Unknown     Social History Social History   Tobacco Use  . Smoking status: Never Smoker  . Smokeless tobacco: Never Used  Substance Use Topics  . Alcohol use: No  . Drug use: No     Allergies   Iohexol; Lipitor [atorvastatin calcium]; Shellfish allergy; Sulfa antibiotics; Sulfonamide derivatives; Metrizamide; Latex; and Zocor [simvastatin]   Review of Systems Review of Systems  ROS reviewed and all otherwise negative except for mentioned in HPI   Physical Exam Updated Vital Signs BP 133/89   Pulse 76   Temp (!) 97.3 F (36.3 C)   Resp 17   Ht 5\' 11"  (1.803 m)   Wt 72.6 kg (160 lb)   SpO2 94%   BMI 22.32 kg/m  Vitals reviewed Physical Exam  Physical Examination: General appearance - alert, well appearing, and in no distress Mental status - alert, oriented to person, place, and time Eyes - no conjunctival injection, no scleral icterus Mouth - mucous membranes moist, pharynx normal without lesions Neck - supple, no significant adenopathy Chest - clear to auscultation, no wheezes, rales or rhonchi, symmetric air entry Heart - normal rate, regular rhythm, normal S1, S2, no murmurs, rubs, clicks or gallops Abdomen - soft, nontender, nondistended, no masses or organomegaly Neurological - alert, oriented, normal speech,  Extremities - peripheral pulses normal, no pedal edema, no clubbing or cyanosis Skin - normal coloration and turgor, no rashes,    ED Treatments / Results  Labs (all labs ordered are listed, but only abnormal results are  displayed) Labs Reviewed  BASIC METABOLIC PANEL - Abnormal; Notable for the following components:      Result Value   Glucose, Bld 120 (*)    Creatinine,  Ser 1.55 (*)    Calcium 8.3 (*)    GFR calc non Af Amer 49 (*)    GFR calc Af Amer 57 (*)    All other components within normal limits  CBC - Abnormal; Notable for the following components:   RBC 4.08 (*)    Hemoglobin 10.8 (*)    HCT 32.7 (*)    Platelets 147 (*)    All other components within normal limits  I-STAT TROPONIN, ED  I-STAT TROPONIN, ED    EKG  EKG Interpretation  Date/Time:  Monday January 28 2018 15:17:55 EST Ventricular Rate:  77 PR Interval:  180 QRS Duration: 112 QT Interval:  414 QTC Calculation: 468 R Axis:   10 Text Interpretation:  Sinus rhythm with occasional Premature ventricular complexes Biatrial enlargement Incomplete left bundle branch block T wave abnormality, consider lateral ischemia Prolonged QT Abnormal ECG Since previous tracing PVCs are new Confirmed by Alfonzo Beers [phone]) on 01/28/2018 3:39:27 PM       Radiology Dg Chest 2 View  Result Date: 01/28/2018 CLINICAL DATA:  Shortness of breath. EXAM: CHEST  2 VIEW COMPARISON:  Radiographs of December 15, 2017. FINDINGS: Stable cardiomegaly and aneurysmal dilatation of thoracic aorta is noted. Status post coronary artery bypass graft. Single lead left-sided pacemaker is unchanged in position. No pneumothorax is noted. Minimal bilateral pleural effusions may be present. Stable mild bibasilar subsegmental atelectasis or scarring is noted. Bony thorax is unremarkable. IMPRESSION: Stable cardiomegaly and aneurysmal dilatation of thoracic aorta. Stable mild bibasilar subsegmental atelectasis or scarring. Minimal bilateral pleural effusions. Electronically Signed   By: Marijo Conception, M.D.   On: 01/28/2018 16:07    Procedures Procedures (including critical care time)  Medications Ordered in ED Medications - No data to display   Initial  Impression / Assessment and Plan / ED Course  I have reviewed the triage vital signs and the nursing notes.  Pertinent labs & imaging results that were available during my care of the patient were reviewed by me and considered in my medical decision making (see chart for details).     Patient presenting with complaint of nausea and hypertension.  He states he feels nauseated when he has hypertension.  He took nitroglycerin and now his blood pressure is normal and he feels back to his baseline.  He did not have any chest pain or shortness of breath associated.  No diaphoresis.  He has been well-appearing while in the ED and had 2 sets of troponin which were both negative.  His blood pressure is reassuring as well.  Patient is requesting discharge and I feel this is reasonable.  He needs close follow-up with cardiology which he is agreeable with pursuing. Discharged with strict return precautions.  Pt agreeable with plan.  Final Clinical Impressions(s) / ED Diagnoses   Final diagnoses:  Essential hypertension  Nausea    ED Discharge Orders    None       Marcha Dutton Forbes Cellar, MD 01/28/18 2012

## 2018-01-28 NOTE — ED Notes (Signed)
Patient transported to X-ray 

## 2018-01-28 NOTE — Discharge Instructions (Signed)
Return to the ED with any concerns including chest pain, difficulty breathing, fainting, leg swelling, decreased level of alertness/lethargy, or any other alarming symptoms

## 2018-01-28 NOTE — ED Triage Notes (Signed)
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 Pt c/o sudden onset of nausea with no associated symptoms. Pt has significant hx of MI, CHF, AAA repaired and Stent, CABG, Pacer. Pt took 4 nitro Prior to EMS arrival because he associates nausea with HTN.  Denies any complaint on arrival.

## 2018-01-28 NOTE — ED Notes (Signed)
ED Provider at bedside. 

## 2018-02-11 ENCOUNTER — Ambulatory Visit (INDEPENDENT_AMBULATORY_CARE_PROVIDER_SITE_OTHER): Payer: Medicaid Other | Admitting: *Deleted

## 2018-02-11 DIAGNOSIS — I5022 Chronic systolic (congestive) heart failure: Secondary | ICD-10-CM

## 2018-02-11 NOTE — Progress Notes (Signed)
Remote ICD transmission.   

## 2018-02-12 LAB — CUP PACEART REMOTE DEVICE CHECK
Battery Remaining Percentage: 100 %
Brady Statistic RV Percent Paced: 0 %
HIGH POWER IMPEDANCE MEASURED VALUE: 51 Ohm
Implantable Lead Implant Date: 20171114
Lead Channel Impedance Value: 456 Ohm
Lead Channel Pacing Threshold Amplitude: 0.6 V
Lead Channel Setting Pacing Amplitude: 3.5 V
Lead Channel Setting Pacing Pulse Width: 0.4 ms
Lead Channel Setting Sensing Sensitivity: 0.5 mV
MDC IDC LEAD LOCATION: 753860
MDC IDC LEAD SERIAL: 333496
MDC IDC MSMT BATTERY REMAINING LONGEVITY: 102 mo
MDC IDC MSMT LEADCHNL RV PACING THRESHOLD PULSEWIDTH: 0.4 ms
MDC IDC PG IMPLANT DT: 20131220
MDC IDC SESS DTM: 20190218092600
Pulse Gen Serial Number: 124654

## 2018-02-13 ENCOUNTER — Encounter: Payer: Self-pay | Admitting: Cardiology

## 2018-02-16 ENCOUNTER — Other Ambulatory Visit (HOSPITAL_COMMUNITY): Payer: Self-pay | Admitting: Internal Medicine

## 2018-02-18 ENCOUNTER — Ambulatory Visit: Payer: Medicaid Other | Admitting: Internal Medicine

## 2018-02-18 ENCOUNTER — Telehealth (HOSPITAL_COMMUNITY): Payer: Self-pay | Admitting: Pharmacist

## 2018-02-18 VITALS — BP 100/60 | HR 76 | Temp 98.4°F | Ht 71.0 in | Wt 175.8 lb

## 2018-02-18 DIAGNOSIS — K4020 Bilateral inguinal hernia, without obstruction or gangrene, not specified as recurrent: Secondary | ICD-10-CM | POA: Diagnosis not present

## 2018-02-18 DIAGNOSIS — K4090 Unilateral inguinal hernia, without obstruction or gangrene, not specified as recurrent: Secondary | ICD-10-CM | POA: Insufficient documentation

## 2018-02-18 NOTE — Progress Notes (Signed)
   [facility] Chad Mo, MD Phone: [phone]  Reason For Visit:  SDA for Inguinal Hernia   # Inguinal Hernia   Patient presenting with to inguinal hernias.  States he has had a right groin hernia for several years.  Recently he developed a indirect left groin hernia which is in his scrotum.  This has been bothering him.  He states that the pain was significant last week.  It has now improved.  He is able to reduce it however it is uncomfortable to do so.  Past medical history is significant for scrotal torsion when he was little.  Past Medical History Reviewed problem list.  Medications- reviewed and updated No additions to family history Social history- patient is a non- smoker  Objective: BP 100/60 (BP Location: Right Arm, Patient Position: Sitting, Cuff Size: Normal)   Pulse 76   Temp 98.4 F (36.9 C) (Oral)   Ht 5\' 11"  (1.803 m)   Wt 175 lb 12.8 oz (79.7 kg)   SpO2 97%   BMI 24.52 kg/m  Gen: NAD, alert, cooperative with exam GU: Direct inguinal hernia noted on the right side reducible no signs of erythema, indirect scrotal hernia is significant noted, able to reduce though uncomfortable for patient, no signs of strangulation noted  Skin: No rashes, no erythema   Assessment/Plan: See problem based a/p  Inguinal hernia Two inguinal hernia, no concern for incarceration or stimulation - Ambulatory referral to General Surgery

## 2018-02-18 NOTE — Patient Instructions (Signed)
I am going to refer to surgery. You should here from them with an appointment this week

## 2018-02-18 NOTE — Telephone Encounter (Signed)
Entresto PA approved by [place] [employer] through 02/08/19.  [name]. [name], PharmD, BCPS, CPP Clinical Pharmacist Phone: [phone] 02/18/2018 3:11 PM

## 2018-02-21 ENCOUNTER — Encounter: Payer: Self-pay | Admitting: Internal Medicine

## 2018-02-21 NOTE — Assessment & Plan Note (Signed)
Two inguinal hernia, no concern for incarceration or stimulation - Ambulatory referral to General Surgery

## 2018-02-22 DIAGNOSIS — D649 Anemia, unspecified: Secondary | ICD-10-CM | POA: Diagnosis not present

## 2018-02-22 DIAGNOSIS — G4733 Obstructive sleep apnea (adult) (pediatric): Secondary | ICD-10-CM | POA: Diagnosis not present

## 2018-02-22 DIAGNOSIS — I10 Essential (primary) hypertension: Secondary | ICD-10-CM | POA: Diagnosis not present

## 2018-02-22 DIAGNOSIS — M109 Gout, unspecified: Secondary | ICD-10-CM | POA: Diagnosis not present

## 2018-02-22 DIAGNOSIS — I259 Chronic ischemic heart disease, unspecified: Secondary | ICD-10-CM | POA: Diagnosis not present

## 2018-02-28 IMAGING — CR DG CHEST 2V
2 series · 2 of 2 positions shown · non-contrast
Comparison: 06/01/2017 chest radiograph.

CLINICAL DATA: Chest pain

EXAM:
CHEST  2 VIEW

[chest pa]
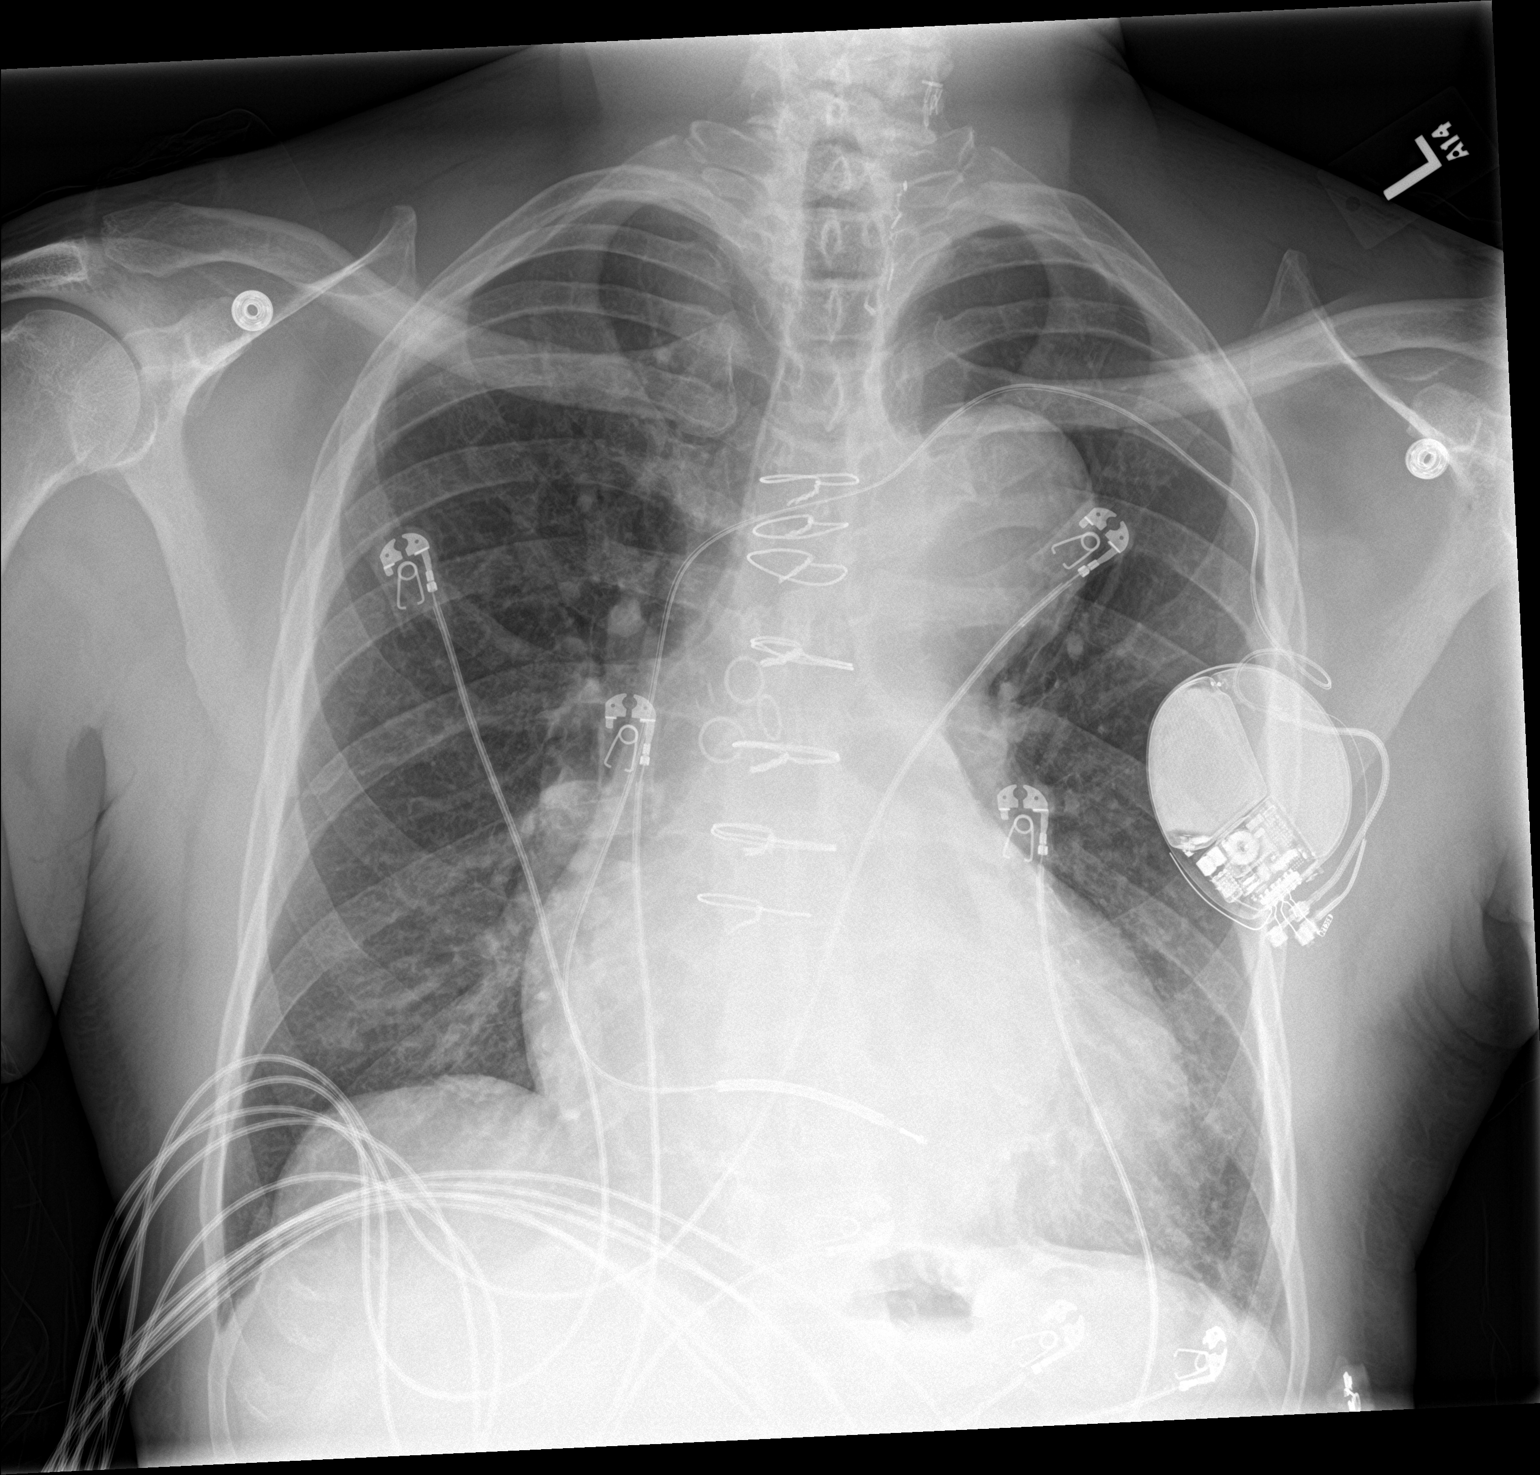

[chest lat]
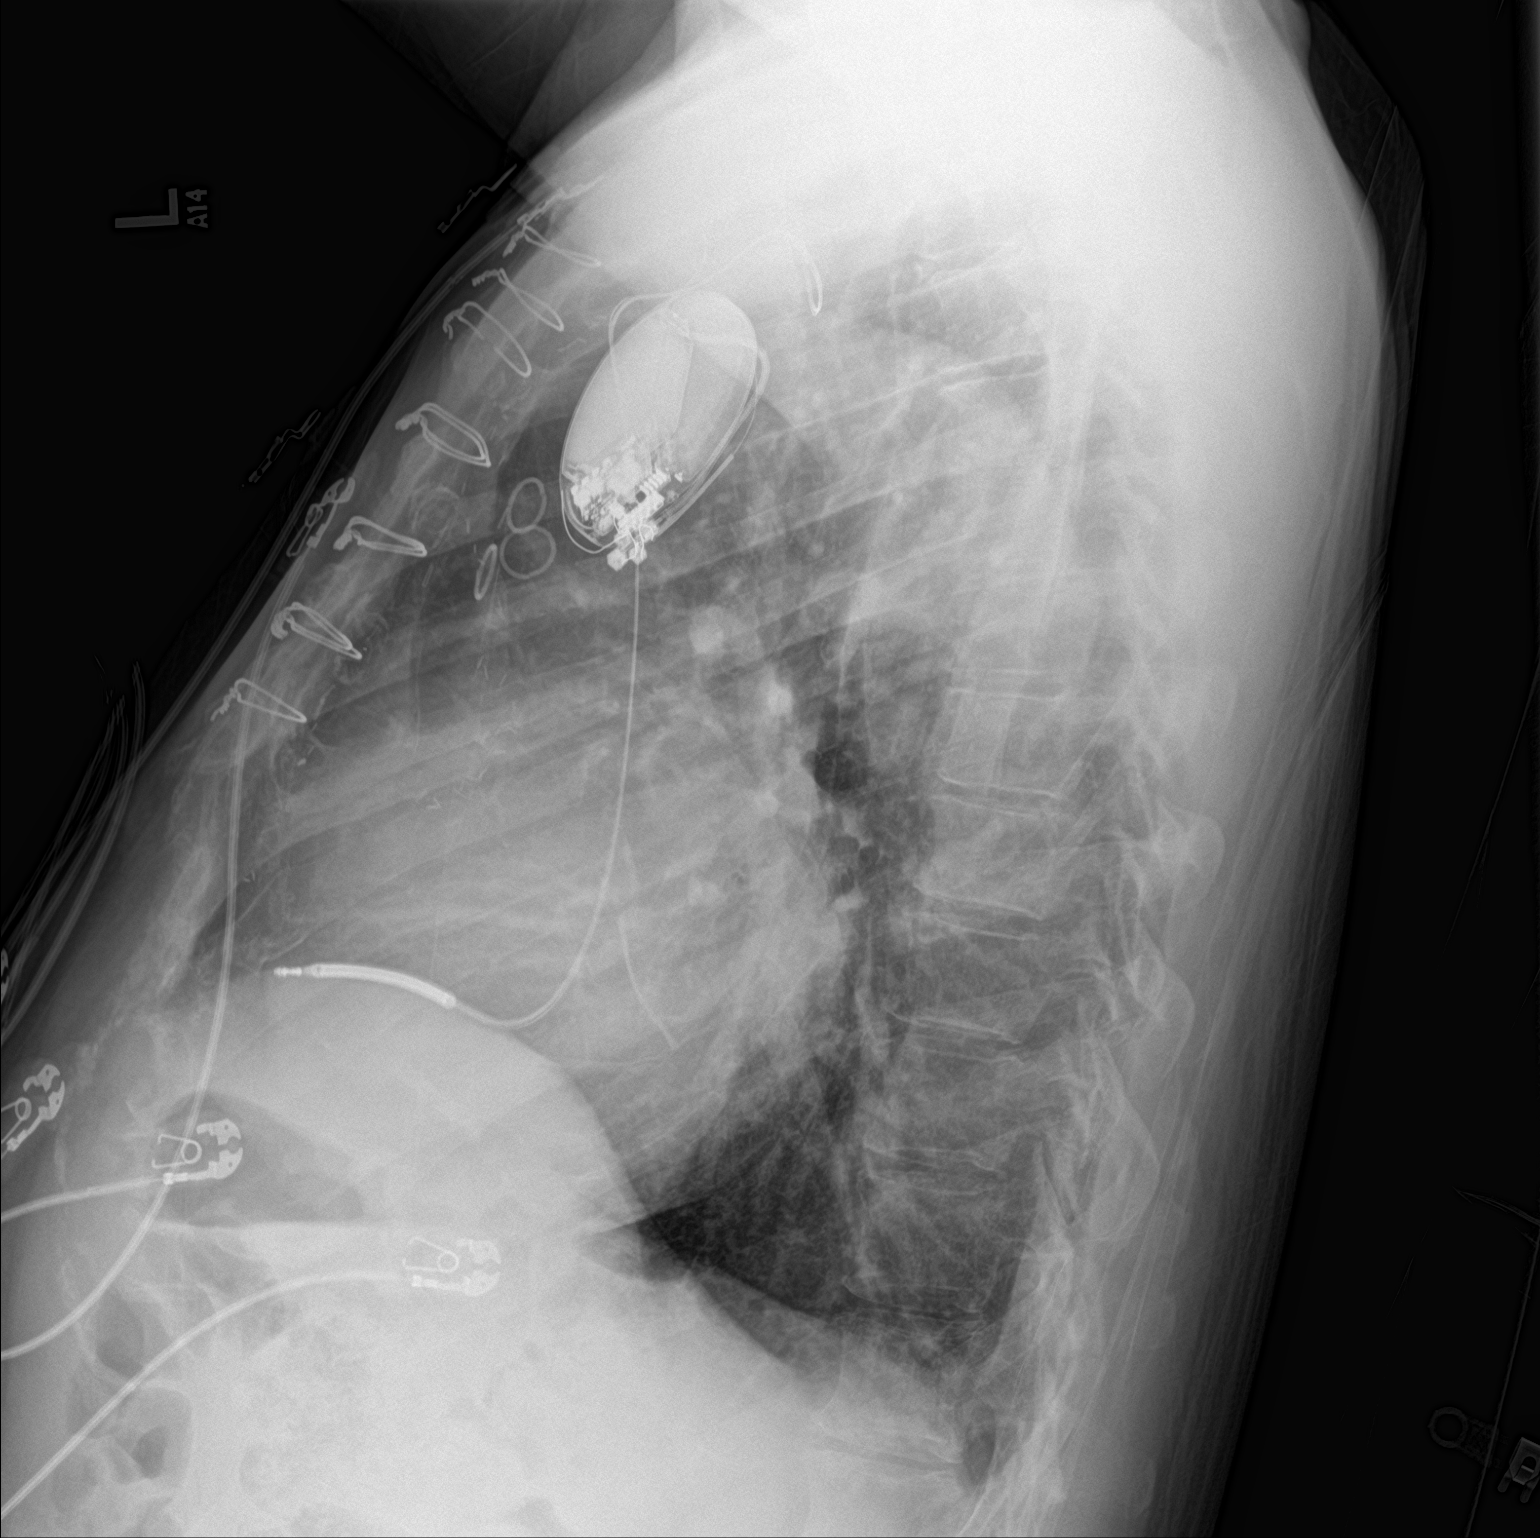

[2 of 2 positions shown; findings below may reference images not displayed]

FINDINGS: Stable configuration of sternotomy wires and single lead left
subclavian ICD. Stable cardiomediastinal silhouette with mild
cardiomegaly and prominent tortuous aortic arch. No pneumothorax. No
pleural effusion. Cephalization of the pulmonary vasculature without
overt pulmonary edema. No acute consolidative airspace disease.
IMPRESSION: Stable mild cardiomegaly without overt pulmonary edema. No active
pulmonary disease.

## 2018-02-28 IMAGING — CT CT CHEST W/O CM
2 of 4 series · 15 of 36 positions shown, 18 images · non-contrast
Comparison: Radiographs of same day.  CT scan June 01, 2017.

CLINICAL DATA: Chest pain.

EXAM:
CT CHEST WITHOUT CONTRAST
TECHNIQUE: Multidetector CT imaging of the chest was performed following the
standard protocol without IV contrast.

[Series 3: chest w/o 2mm st · axial · non-contrast · 0.85mm/px · z∈[+993,+1329]mm · 12 of 194 slices shown, 15 images]
[im 13/194  mediastinal]
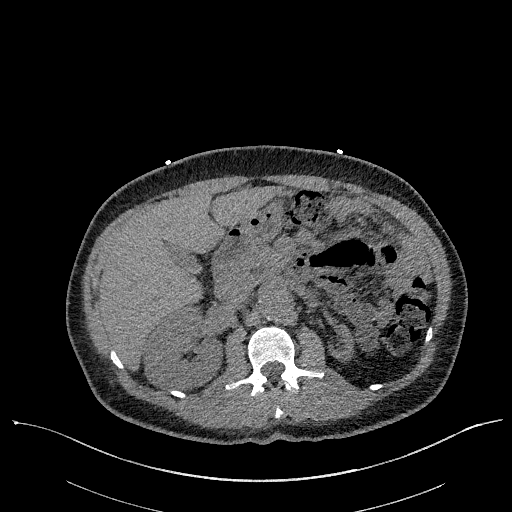
[im 13/194  lung]
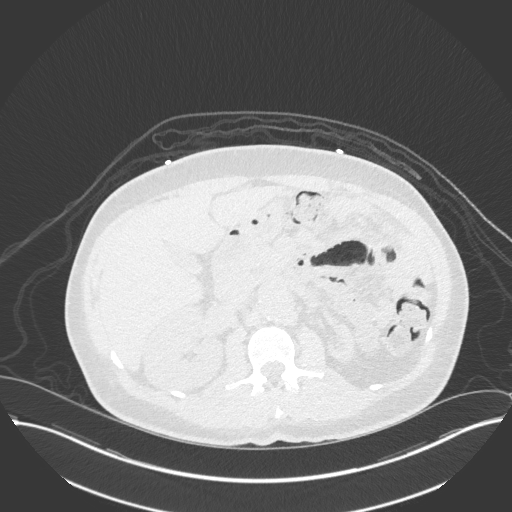
[im 26/194  lung]
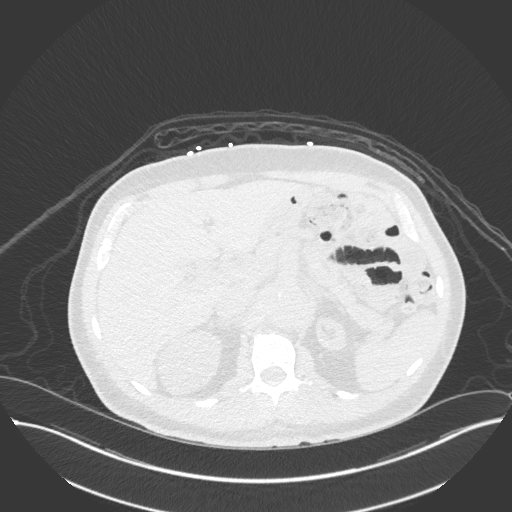
[im 39/194  lung]
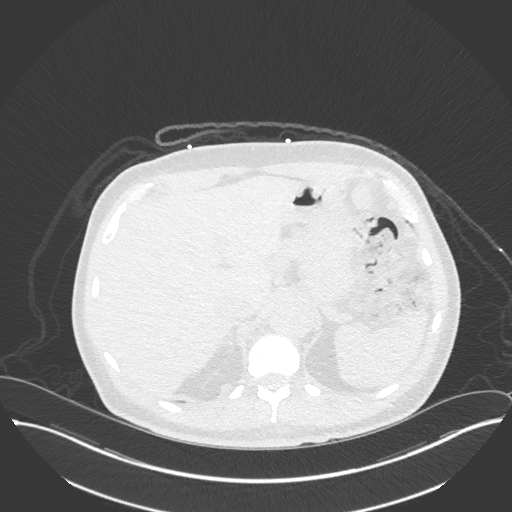
[im 65/194  lung]
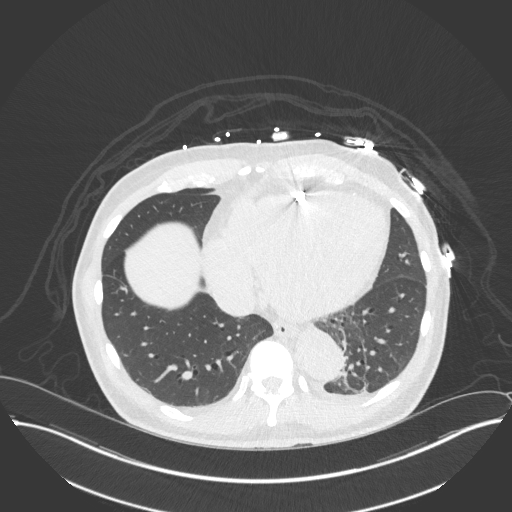
[im 78/194  mediastinal]
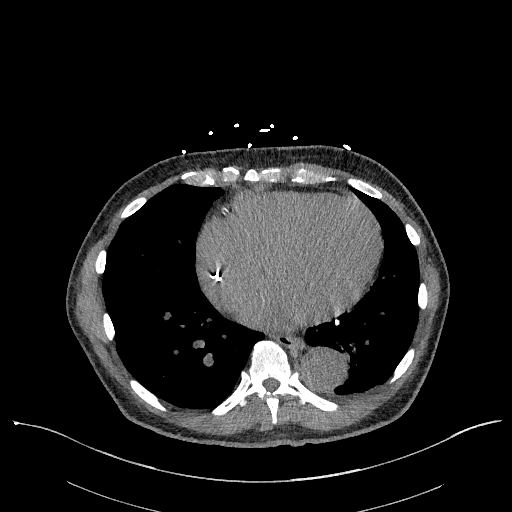
[im 78/194  lung]
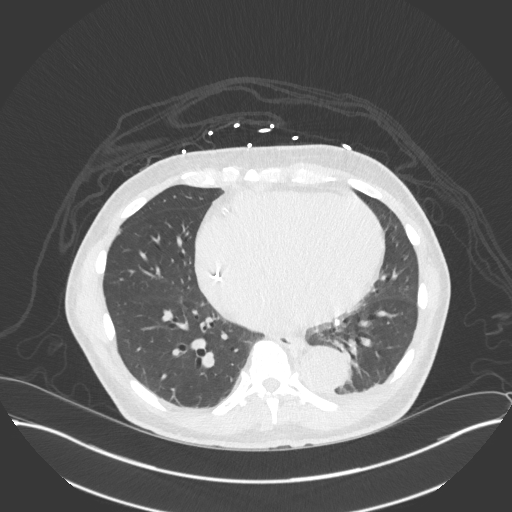
[im 91/194  lung]
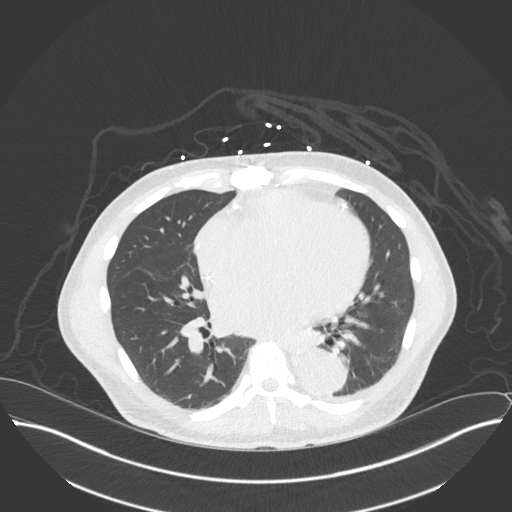
[im 103/194  lung]
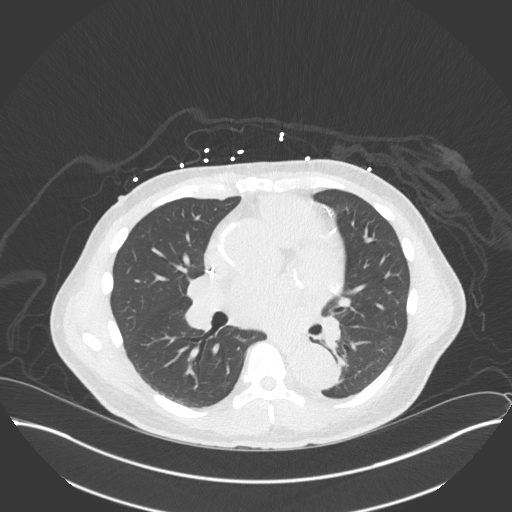
[im 116/194  lung]
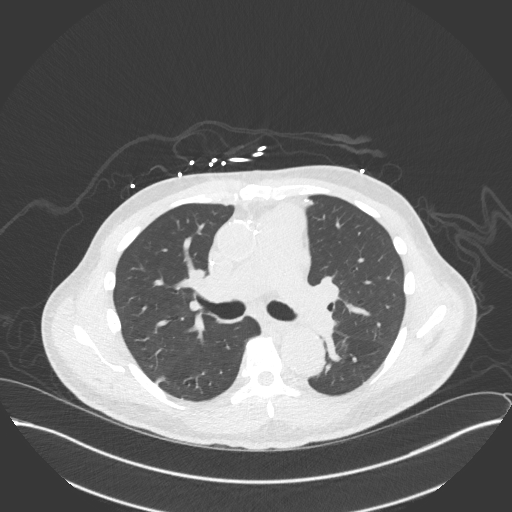
[im 129/194  mediastinal]
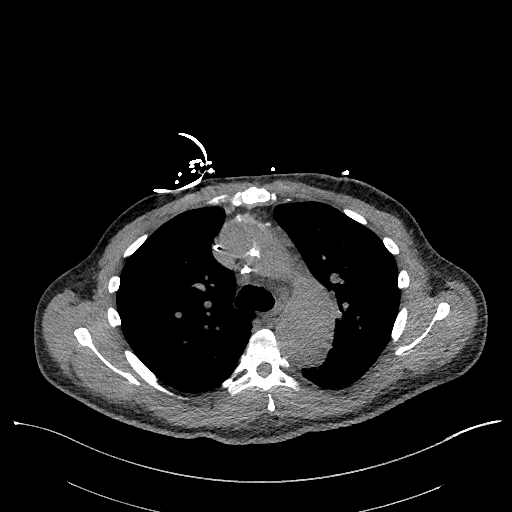
[im 129/194  lung]
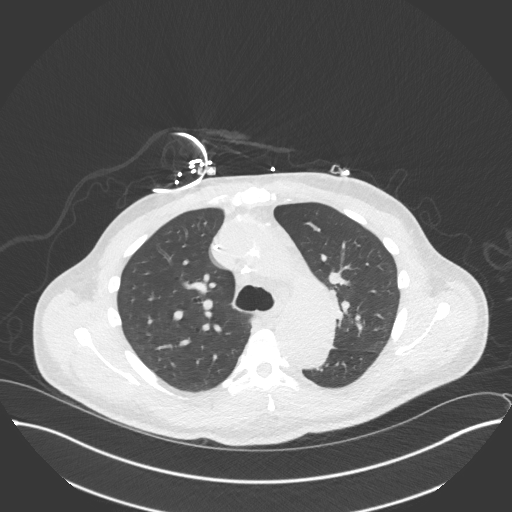
[im 155/194  lung]
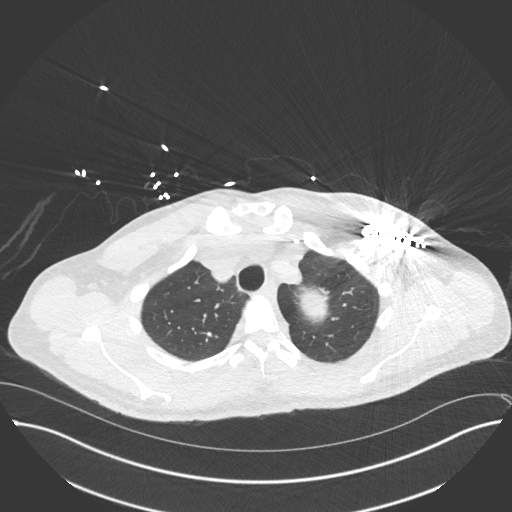
[im 168/194  lung]
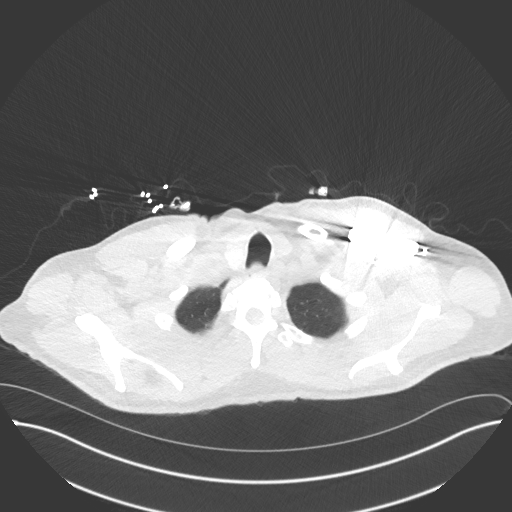
[im 181/194  lung]
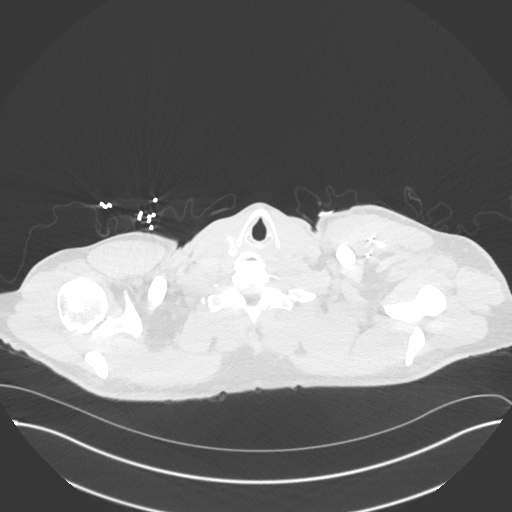

[Series 6: chest w/o 3mm st cor · coronal · non-contrast · 0.72mm/px · 3 of 92 slices shown]
[im 19/92  lung]
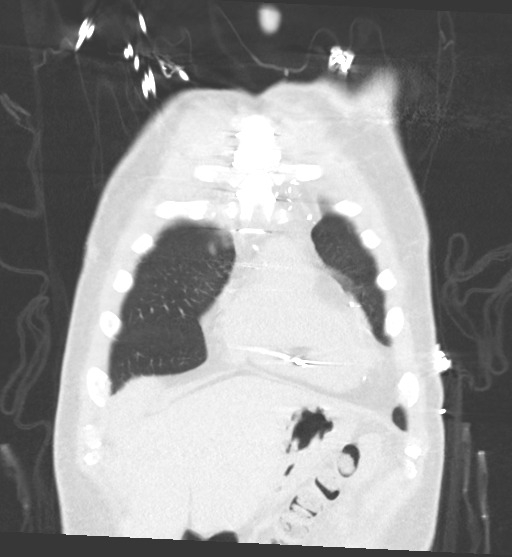
[im 37/92  lung]
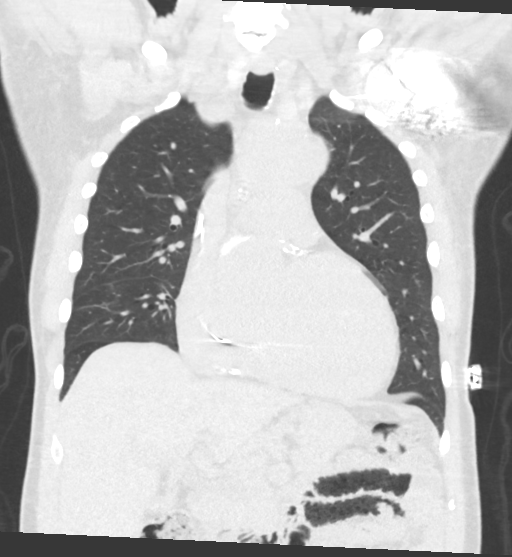
[im 55/92  lung]
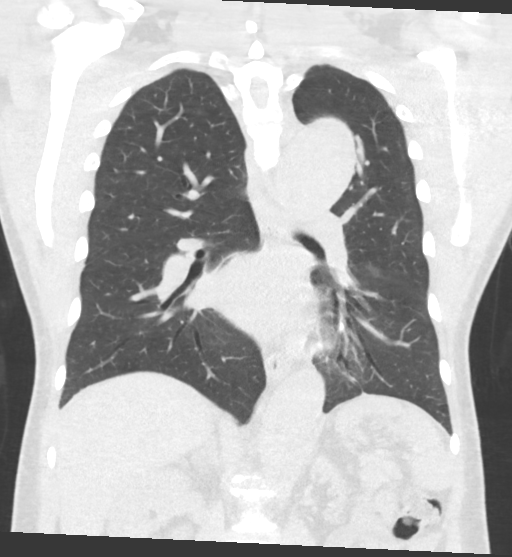

[15 of 36 positions shown; findings below may reference images not displayed]

FINDINGS: Cardiovascular: Atherosclerosis of thoracic aorta is noted. Status
post surgical repair of ascending thoracic aorta. Status post
coronary artery bypass graft. 6 cm proximal descending thoracic
aortic aneurysm is noted which is not significantly changed based on
my own measurement of same area on prior exam. Due to the lack of
intravenous contrast, dissection cannot be excluded. Left-sided
pacemaker is unchanged in position. Aortic root is dilated at
cm. Stable aneurysmal dilatation of proximal right innominate artery
is noted at 3.4 cm. Mild cardiomegaly is noted. No pericardial
effusion is noted.

Mediastinum/Nodes: Stable calcified pretracheal and right hilar
lymph nodes are noted most consistent with granulomatous disease.
Thyroid gland is unremarkable. Esophagus is unremarkable.

Lungs/Pleura: No pneumothorax or pleural effusion is noted. Stable
calcified granuloma is noted in right upper lobe. Stable left
basilar scarring is noted.

Upper Abdomen: Mild cholelithiasis is noted. Severe left renal
atrophy is noted.

Musculoskeletal: No chest wall mass or suspicious bone lesions
identified.
IMPRESSION: Status post surgical repair of ascending thoracic aorta. 6 cm
proximal descending thoracic aortic aneurysm is noted which is not
significantly changed compared to prior exam. Dissection cannot be
excluded due the lack of intravenous contrast.

Stable aortic root dilatation is noted at 4.2 cm.

Stable calcified mediastinal adenopathy most consistent
granulomatous disease.

Mild cholelithiasis.

Severe left renal atrophy.

Aortic Atherosclerosis ([id number]-[id number].O).

## 2018-03-01 ENCOUNTER — Other Ambulatory Visit (HOSPITAL_COMMUNITY): Payer: Self-pay | Admitting: General Surgery

## 2018-03-01 ENCOUNTER — Ambulatory Visit (HOSPITAL_COMMUNITY)
Admission: RE | Admit: 2018-03-01 | Discharge: 2018-03-01 | Disposition: A | Discharge status: Home / Self Care | Payer: Medicaid Other | Source: Ambulatory Visit | Attending: General Surgery | Admitting: General Surgery

## 2018-03-01 ENCOUNTER — Telehealth: Payer: Self-pay | Admitting: *Deleted

## 2018-03-01 DIAGNOSIS — K4020 Bilateral inguinal hernia, without obstruction or gangrene, not specified as recurrent: Secondary | ICD-10-CM | POA: Insufficient documentation

## 2018-03-01 NOTE — Telephone Encounter (Signed)
   [facility] Pre-operative Risk Assessment    Request for surgical clearance:  1. What type of surgery is being performed? Bilateral inguinal hernia Repair  2. When is this surgery scheduled? Pending Clearance   3. What type of clearance is required (medical clearance vs. Pharmacy clearance to hold med vs. Both)? Both  4. Are there any medications that need to be held prior to surgery and how long? Plavix before and after procedure   5. Practice name and name of physician performing surgery? [facility], Hewitt Blade, MD  6. What is your office phone and fax number? Atten[place]wood Coke, RN [phone], [phone]  7. Anesthesia type (None, local, MAC, general) ? General   Sissy Goetzke 03/01/2018, 2:53 PM  _________________________________________________________________   (provider comments below)

## 2018-03-06 NOTE — Telephone Encounter (Signed)
   Primary Cardiologist:Daniel Bensimhon, MD  Chart reviewed as part of pre-operative protocol coverage. Because of Chad [name]'s past medical history and time since last visit, he/she will require a follow-up visit in order to better assess preoperative cardiovascular risk. Last seen 11/2017 during admission and recommended CHF clinic follow up,   Pre-op covering staff: - Please schedule appointment with CHF clinic and call patient to inform them. - Please contact requesting surgeon's office via preferred method (i.e, phone, fax) to inform them of need for appointment prior to surgery.  [place], [place]  03/06/2018, 1:55 PM

## 2018-03-06 NOTE — Telephone Encounter (Signed)
Called CHF clinic to schedule patient to see Dr. Haroldine Laws for cardiac clearance for an upcoming bilateral inguinal hernia repair. Patient was scheduled with the Dr. Haroldine Laws on March 25, 2018 at 3:20pm. Called patient to let him know of appt date and time. Patient stated he needed an appt "way before then". I told patient he was free to call the clinic and see if he would be able to get a sooner appt. He asked if "Linna Hoff" was at the office today. I stated I didn't know. He said he would take the appt already scheduled but would be calling to speak with "Linna Hoff" or Nira Conn to get a sooner appt.  Called requesting surgeons office to let them know patient will need to be seen with CHF clinic/Dr.Bensimhon before he can be cleared for surgery.

## 2018-03-08 ENCOUNTER — Encounter (HOSPITAL_COMMUNITY): Payer: Self-pay | Admitting: *Deleted

## 2018-03-21 DIAGNOSIS — N500 Atrophy of testis: Secondary | ICD-10-CM | POA: Diagnosis not present

## 2018-03-21 DIAGNOSIS — R5382 Chronic fatigue, unspecified: Secondary | ICD-10-CM | POA: Diagnosis not present

## 2018-03-21 DIAGNOSIS — N4341 Spermatocele of epididymis, single: Secondary | ICD-10-CM | POA: Diagnosis not present

## 2018-03-21 DIAGNOSIS — N430 Encysted hydrocele: Secondary | ICD-10-CM | POA: Diagnosis not present

## 2018-03-25 ENCOUNTER — Ambulatory Visit (HOSPITAL_COMMUNITY)
Admission: RE | Admit: 2018-03-25 | Discharge: 2018-03-25 | Disposition: A | Discharge status: Home / Self Care | Payer: Medicaid Other | Source: Ambulatory Visit | Attending: Internal Medicine | Admitting: Internal Medicine

## 2018-03-25 ENCOUNTER — Encounter (HOSPITAL_COMMUNITY): Payer: Self-pay | Admitting: *Deleted

## 2018-03-25 VITALS — BP 128/82 | HR 74 | Wt 171.0 lb

## 2018-03-25 DIAGNOSIS — I5022 Chronic systolic (congestive) heart failure: Secondary | ICD-10-CM | POA: Diagnosis not present

## 2018-03-25 DIAGNOSIS — E785 Hyperlipidemia, unspecified: Secondary | ICD-10-CM | POA: Diagnosis not present

## 2018-03-25 DIAGNOSIS — J42 Unspecified chronic bronchitis: Secondary | ICD-10-CM | POA: Insufficient documentation

## 2018-03-25 DIAGNOSIS — Z7902 Long term (current) use of antithrombotics/antiplatelets: Secondary | ICD-10-CM | POA: Diagnosis not present

## 2018-03-25 DIAGNOSIS — Z79899 Other long term (current) drug therapy: Secondary | ICD-10-CM | POA: Insufficient documentation

## 2018-03-25 DIAGNOSIS — G4730 Sleep apnea, unspecified: Secondary | ICD-10-CM | POA: Diagnosis not present

## 2018-03-25 DIAGNOSIS — I4581 Long QT syndrome: Secondary | ICD-10-CM | POA: Insufficient documentation

## 2018-03-25 DIAGNOSIS — I472 Ventricular tachycardia: Secondary | ICD-10-CM | POA: Diagnosis not present

## 2018-03-25 DIAGNOSIS — I2510 Atherosclerotic heart disease of native coronary artery without angina pectoris: Secondary | ICD-10-CM | POA: Diagnosis not present

## 2018-03-25 DIAGNOSIS — Z951 Presence of aortocoronary bypass graft: Secondary | ICD-10-CM | POA: Insufficient documentation

## 2018-03-25 DIAGNOSIS — I447 Left bundle-branch block, unspecified: Secondary | ICD-10-CM | POA: Diagnosis not present

## 2018-03-25 DIAGNOSIS — N189 Chronic kidney disease, unspecified: Secondary | ICD-10-CM | POA: Insufficient documentation

## 2018-03-25 DIAGNOSIS — Z95810 Presence of automatic (implantable) cardiac defibrillator: Secondary | ICD-10-CM | POA: Diagnosis not present

## 2018-03-25 DIAGNOSIS — I252 Old myocardial infarction: Secondary | ICD-10-CM | POA: Insufficient documentation

## 2018-03-25 DIAGNOSIS — M109 Gout, unspecified: Secondary | ICD-10-CM | POA: Diagnosis not present

## 2018-03-25 DIAGNOSIS — I130 Hypertensive heart and chronic kidney disease with heart failure and stage 1 through stage 4 chronic kidney disease, or unspecified chronic kidney disease: Secondary | ICD-10-CM | POA: Insufficient documentation

## 2018-03-25 DIAGNOSIS — J45909 Unspecified asthma, uncomplicated: Secondary | ICD-10-CM | POA: Insufficient documentation

## 2018-03-25 DIAGNOSIS — Z85850 Personal history of malignant neoplasm of thyroid: Secondary | ICD-10-CM | POA: Diagnosis not present

## 2018-03-25 DIAGNOSIS — K219 Gastro-esophageal reflux disease without esophagitis: Secondary | ICD-10-CM | POA: Diagnosis not present

## 2018-03-25 DIAGNOSIS — I255 Ischemic cardiomyopathy: Secondary | ICD-10-CM | POA: Diagnosis not present

## 2018-03-25 DIAGNOSIS — Z7982 Long term (current) use of aspirin: Secondary | ICD-10-CM | POA: Diagnosis not present

## 2018-03-25 DIAGNOSIS — G4733 Obstructive sleep apnea (adult) (pediatric): Secondary | ICD-10-CM | POA: Diagnosis not present

## 2018-03-25 DIAGNOSIS — I259 Chronic ischemic heart disease, unspecified: Secondary | ICD-10-CM | POA: Diagnosis not present

## 2018-03-25 DIAGNOSIS — D649 Anemia, unspecified: Secondary | ICD-10-CM | POA: Diagnosis not present

## 2018-03-25 DIAGNOSIS — I10 Essential (primary) hypertension: Secondary | ICD-10-CM | POA: Diagnosis not present

## 2018-03-25 LAB — CBC
HEMATOCRIT: 36.7 % — AB (ref 39.0–52.0)
Hemoglobin: 11.5 g/dL — ABNORMAL LOW (ref 13.0–17.0)
MCH: 25.2 pg — AB (ref 26.0–34.0)
MCHC: 31.3 g/dL (ref 30.0–36.0)
MCV: 80.5 fL (ref 78.0–100.0)
Platelets: 162 10*3/uL (ref 150–400)
RBC: 4.56 MIL/uL (ref 4.22–5.81)
RDW: 15 % (ref 11.5–15.5)
WBC: 3 10*3/uL — ABNORMAL LOW (ref 4.0–10.5)

## 2018-03-25 LAB — PROTIME-INR
INR: 1.17
Prothrombin Time: 14.8 seconds (ref 11.4–15.2)

## 2018-03-25 LAB — BASIC METABOLIC PANEL
Anion gap: 11 (ref 5–15)
BUN: 24 mg/dL — AB (ref 6–20)
CHLORIDE: 104 mmol/L (ref 101–111)
CO2: 26 mmol/L (ref 22–32)
Calcium: 8.6 mg/dL — ABNORMAL LOW (ref 8.9–10.3)
Creatinine, Ser: 1.44 mg/dL — ABNORMAL HIGH (ref 0.61–1.24)
GFR calc Af Amer: >60 mL/min (ref >60)
GFR calc non Af Amer: 53 mL/min — ABNORMAL LOW (ref >60)
GLUCOSE: 108 mg/dL — AB (ref 65–99)
POTASSIUM: 3.7 mmol/L (ref 3.5–5.1)
Sodium: 141 mmol/L (ref 135–145)

## 2018-03-25 LAB — BRAIN NATRIURETIC PEPTIDE: B Natriuretic Peptide: 2260.3 pg/mL — ABNORMAL HIGH (ref 0.0–100.0)

## 2018-03-25 LAB — TSH: TSH: 1.795 u[IU]/mL (ref 0.350–4.500)

## 2018-03-25 NOTE — Progress Notes (Signed)
Advanced Heart Failure Clinic Note   Patient ID: Timoteo Ace, male   DOB: 11-20-1961, 57 y.o.   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 male with history of severe HTN, coronary artery disease status post previous myocardial infarction and bypass surgery in 2006 also DES to native PDA in 2011.  He also has a history congestive heart failure secondary to ischemic cardiomyopathy EF 20-25%   He is s/p single chamber [employer] ICD.  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 Also with significant low back pain s/p 2 surgeries.   He has had episodes of CP but have avoided cath due to previous dissection and solitary kidney.  Myoview 11/2015  1. Evidence for very mild reversibility and ischemia involving the anterolateral wall and a portion of the lateral wall. 2. Large infarct involving the inferior wall. There is dyskinesia in the inferior wall. 3. Left ventricular ejection fraction is 32%. Left ventricle dilatation.   In 11/17 admitted for ICD shocks due to fractured RV lead. Underwent lead extraction and replacement.   We saw him in 2/18 for acute visit due to increasing dyspnea and volume overload. ECHO EF 20-25% moderate RV dysfunction. Moderate AI and severe MR. Ao Root 93mm-> 22mm.   In 4/18, he saw Dr. 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 He was referred to [facility] Transplant team for further evaluation in 6/18 and felt to be too early for transplant based on  CPX.  Admitted 12/18 with volume overload and diuresed 11 pounds.   He returns for f/u. Feels much worse. Fatigued and dsypneic with mild activity. No edema, orthopnea or PND. Has been losing weight. No CP. Compliant with meds.   ICD interrogated personally in clinic. No VT/VF. Activity level now < 1 hour/day  Personally reviewed    CPX 4/18   FVC 3.11 (73%)    FEV1 2.40 (71%)     FEV1/FVC 77 (97%)     MVV 117 (77%)  Resting HR: 75 Peak HR: 134  (81% age predicted max HR) BP rest: 120/68 BP peak: 146/82 Peak VO2: 18.6 (55% predicted peak VO2) VE/VCO2 slope: 31 OUES: 2.01 Peak RER: 1.05 Ventilatory Threshold: 16.0 (47% predicted or measured peak VO2) VE/MVV: 49% O2pulse: 13  (76% predicted O2pulse)   3/18: ECHO EF 20-25% Moderate AI, moderate to severe MR, moderate TR. Ao Root 50cm 10/22/2015: ECHO EF 20-25% 11/26/12 ECHO EF 35-40%  Labs 11/25/13 Cholesterol 186 TG 121 HDL 49  Labs 12/09/13 K 3.6 creatinine 1.3  Labs 3/15 K 4.1, creatinine 1.4, BNP 589, HCT 39.8 Labs 3/16 K 3.8, creatinine 1.56, HCT 36.5 Labs 12/07/2015: K 3.6 Creatinine 1.37  Labs 05/01/2016: K 4.0 Creatinine 1.34   ROS: All pertinent positives and negatives as in HPI, otherwise negative.    Past Medical History:  Diagnosis Date  . AICD (automatic cardioverter/defibrillator) present   . Anemia   . Anginal pain ([place])   . Aortic dissection, thoracoabdominal ([place])    7/10: Type I s/p repair  .  Asthma   . CAD (coronary artery disease)    a. s/p CABG 2006;  b. DES to PDA 2011 (cath: Dx not seen, dRCA/PDA tx with DES; S-PDA occluded (culprit), S-Dx occluded, S-RI and OM ok, L-LAD ok  . Carotid stenosis    dopplers 2011: 0-39% bilat.  . Chest pain syndrome   . CHF (congestive heart failure) ([place])   . Chronic bronchitis ([place])   . Chronic lower back pain   . Chronic systolic heart failure (HCC)    a. 12/13 ECHO: EF 35-40%, sept, apical & posterobasal HK, LV mod dil & sys fx mod reduced, mild  AI, MV mild reg, TV mild reg  . Complication of anesthesia    "difficult to wake afterwards a couple of times"  . 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 Dyspnea   . Family history of adverse reaction to anesthesia    "sister hard to wake up"  . GERD (gastroesophageal reflux disease)   . Gout   . Heart murmur   . HLD (hyperlipidemia)   . HTN (hypertension)    severe  . Myocardial infarction [facility])    "many" (11/07/2016)  . PONV (postoperative nausea and vomiting)   . Sleep apnea    "never RX'd mask" (11/07/2016)  . Thyroid cancer ([place])    Hertle Cell    Current Outpatient Medications  Medication Sig Dispense Refill  . albuterol (PROVENTIL HFA;VENTOLIN HFA) 108 (90 Base) MCG/ACT inhaler Inhale 2 puffs into the lungs every 6 (six) hours as needed for wheezing or shortness of breath. 1 Inhaler 0  . carvedilol (COREG) 25 MG tablet TAKE ONE TABLET BY MOUTH TWICE DAILY WITH A MEAL 60 tablet 6  . clopidogrel (PLAVIX) 75 MG tablet TAKE ONE TABLET BY MOUTH DAILY 90 tablet 3  . cyclobenzaprine (FLEXERIL) 10 MG tablet Take 10 mg by mouth daily as needed for muscle spasms.     . diphenhydrAMINE (BENADRYL) 25 MG tablet Take 25 mg by mouth every 6 (six) hours as needed for allergies.    [name] ENTRESTO 97-103 MG TAKE 1 TABLET BY MOUTH TWICE DAILY 60 tablet 3  . furosemide (LASIX) 40 MG tablet Take 80 mg by mouth 2 (two) times daily.    . hydrALAZINE (APRESOLINE) 50 MG tablet Take 75 mg by mouth 2 (two) times daily.    [name] ibuprofen (ADVIL,MOTRIN) 200 MG tablet Take 200 mg by mouth every 6 (six) hours as needed for moderate pain.    [name] ipratropium-albuterol (DUONEB) 0.5-2.5 (3) MG/3ML SOLN Take 3 mLs by nebulization every 6 (six) hours as needed. 360 mL 3  . isosorbide mononitrate (IMDUR) 60 MG 24 hr tablet TAKE ONE TABLET BY MOUTH TWICE DAILY 60 tablet 6  . naphazoline (NAPHCON) 0.1 % ophthalmic solution Place 1 drop into both eyes 4 (four)  times daily as needed for irritation.    . nitroGLYCERIN (NITROSTAT) 0.4 MG SL tablet PLACE ONE TABLET UNDER THE TONGUE EVERY 5 MINUTES FOR 3 DOSES AS NEEDED FOR CHEST PAIN 25 tablet 12  . Oxycodone HCl 10 MG TABS Take 20 mg by mouth 3 (three) times daily.     . potassium chloride (KLOR-CON M10) 10 MEQ tablet Take 1 tablet (10 mEq total) by mouth daily. 30 tablet 6  . sacubitril-valsartan (ENTRESTO) 97-103 MG Take 1 tablet by mouth 2 (two) times daily.    . sildenafil (VIAGRA) 100 MG tablet TAKE 1/2 TABLET BY MOUTH DAILY  AS NEEDED FOR ERECTILE DYSFUNCTION 10 tablet 0  . Spacer/Aero-Holding Chambers (AEROCHAMBER PLUS) inhaler Use as instructed 1 each 0  . spironolactone (ALDACTONE) 25 MG tablet Take 0.5 tablets (12.5 mg total) by mouth daily. (Patient taking differently: Take 12.5 mg by mouth at bedtime. ) 30 tablet 6   No current facility-administered medications for this encounter.     Allergies  Allergen Reactions  . Iohexol Anaphylaxis and Other (See Comments)     Desc: PT HAS ANAPHYLAXIS WITH CONTRAST MEDIA!  . Lipitor [Atorvastatin Calcium] Anaphylaxis and Other (See Comments)    Large doses  . Shellfish Allergy Anaphylaxis  . Sulfa Antibiotics Shortness Of Breath and Swelling  . Sulfonamide Derivatives Shortness Of Breath and Swelling  . Metrizamide Swelling  . Latex Rash    With long periods of exposure  . Zocor [Simvastatin] Other (See Comments)    Muscle cramps    Vital Signs: Vitals:   03/25/18 1542  BP: 128/82  Pulse: 74  SpO2: 97%  Weight: 171 lb (77.6 kg)   Filed Weights   03/25/18 1542  Weight: 171 lb (77.6 kg)   Physical Exam: General:  Thin weak appearing NAD HEENT: normal Neck: supple. JVP 5-6 Carotids 2+ bilat; + bruits. No lymphadenopathy or thryomegaly appreciated. Cor: PMI laterally displaced. Regular rate & rhythm. 3/6 high pitched musical murmur at RUSB radiating across precordium 2/6 MR Lungs: clear Abdomen: soft, nontender, nondistended. No  hepatosplenomegaly. No bruits or masses. Good bowel sounds. Extremities: no cyanosis, clubbing, rash, edema Neuro: alert & orientedx3, cranial nerves grossly intact. moves all 4 extremities w/o difficulty. Affect pleasant   ASSESSMENT/Plan  1. Chronic systolic HF: Ischemic cardiomyopathy.  Echo (2/16) with EF 25%. S/p [employer] ICD. Echo 3/18. EF 20-25% - He is much worse. Now NYHA IIIB. Have suggested repeat CPX but he feels he is too weak to walk. Will get repeat echo and plan RHC as soon as possible. - He has seen w Dr. Prescott Gum. Given previous dissection and aortic root replacement likely not candidate for VAD. - DHas been seen previously at [facility] for transplant eval. May need f/u - Continue Entresto 97/103 bid - Continue carvedilol 25 mg twice a day.  - Continue hydralazine 75 mg bid/ Imdur 60 bid.  - Continue spironolactone 12.5 mg daily.    2. CAD: s/p CABG.   - No recent s/s ischemia  -Cardiolite 11/2015 with evidence for mild reversibility involving the anterolateral wall and a portion of the lateral wall. - We have avoided left heart cath due to dissection and CKD with solitary kidney (lost kidney due to dissection) - Continue Plavix and ASA 81 mg daily.   - Statin intolerance at any dose.  - Continue Zetia. - PCP follows lipids. If LDL remains elevated, will consider referral to Lipid Clinic to consider PCSK-9  3. CKD in setting of solitary kidney s/p dissection - recheck labs today 4. HTN: - stable. Well controlled on current regimen 5. H/o Type I aortic dissection: Follows with CVTS.  - He has seen w Dr. Prescott Gum. Given previous dissection and aortic root replacement likely not candidate for VAD. - Repeat echo next visit 6. NSVT - ICD interrogated personally. No VT.   Total time spent 45 minutes. Over half that time spent discussing above.     Benay Spice 3:58 PM

## 2018-03-25 NOTE — Patient Instructions (Signed)
Labs today  Your physician has requested that you have an echocardiogram. Echocardiography is a painless test that uses sound waves to create images of your heart. It provides your doctor with information about the size and shape of your heart and how well your heart's chambers and valves are working. This procedure takes approximately one hour. There are no restrictions for this procedure.  Right Heart Catheterization on Monday 4/8, see instruction sheet  Your physician recommends that you schedule a follow-up appointment in: 2 months

## 2018-03-25 NOTE — H&P (View-Only) (Signed)
Advanced Heart Failure Clinic Note   Patient ID: Chad [name], male   DOB: September 27, 1961, 57 y.o.   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  He also has a history congestive heart failure secondary to ischemic cardiomyopathy EF 20-25%   He is s/p single chamber [employer] ICD.  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 Also with significant low back pain s/p 2 surgeries.   He has had episodes of CP but have avoided cath due to previous dissection and solitary kidney.  Myoview 11/2015  1. Evidence for very mild reversibility and ischemia involving the anterolateral wall and a portion of the lateral wall. 2. Large infarct involving the inferior wall. There is dyskinesia in the inferior wall. 3. Left ventricular ejection fraction is 32%. Left ventricle dilatation.   In 11/17 admitted for ICD shocks due to fractured RV lead. Underwent lead extraction and replacement.   We saw him in 2/18 for acute visit due to increasing dyspnea and volume overload. ECHO EF 20-25% moderate RV dysfunction. Moderate AI and severe MR. Ao Root 65mm-> 53mm.   In 4/18, he saw Dr. 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 He was referred to [facility] Transplant team for further evaluation in 6/18 and felt to be too early for transplant based on  CPX.  Admitted 12/18 with volume overload and diuresed 11 pounds.   He returns for f/u. Feels much worse. Fatigued and dsypneic with mild activity. No edema, orthopnea or PND. Has been losing weight. No CP. Compliant with meds.   ICD interrogated personally in clinic. No VT/VF. Activity level now < 1 hour/day  Personally reviewed    CPX 4/18   FVC 3.11 (73%)    FEV1 2.40 (71%)     FEV1/FVC 77 (97%)     MVV 117 (77%)  Resting HR: 75 Peak HR: 134  (81% age predicted max HR) BP rest: 120/68 BP peak: 146/82 Peak VO2: 18.6 (55% predicted peak VO2) VE/VCO2 slope: 31 OUES: 2.01 Peak RER: 1.05 Ventilatory Threshold: 16.0 (47% predicted or measured peak VO2) VE/MVV: 49% O2pulse: 13  (76% predicted O2pulse)   3/18: ECHO EF 20-25% Moderate AI, moderate to severe MR, moderate TR. Ao Root 50cm 10/22/2015: ECHO EF 20-25% 11/26/12 ECHO EF 35-40%  Labs 11/25/13 Cholesterol 186 TG 121 HDL 49  Labs 12/09/13 K 3.6 creatinine 1.3  Labs 3/15 K 4.1, creatinine 1.4, BNP 589, HCT 39.8 Labs 3/16 K 3.8, creatinine 1.56, HCT 36.5 Labs 12/07/2015: K 3.6 Creatinine 1.37  Labs 05/01/2016: K 4.0 Creatinine 1.34   ROS: All pertinent positives and negatives as in HPI, otherwise negative.    Past Medical History:  Diagnosis Date  . AICD (automatic cardioverter/defibrillator) present   . Anemia   . Anginal pain ([place])   . Aortic dissection, thoracoabdominal ([place])    7/10: Type I s/p repair  .  Asthma   . CAD (coronary artery disease)    a. s/p CABG 2006;  b. DES to PDA 2011 (cath: Dx not seen, dRCA/PDA tx with DES; S-PDA occluded (culprit), S-Dx occluded, S-RI and OM ok, L-LAD ok  . Carotid stenosis    dopplers 2011: 0-39% bilat.  . Chest pain syndrome   . CHF (congestive heart failure) ([place])   . Chronic bronchitis ([place])   . Chronic lower back pain   . Chronic systolic heart failure (HCC)    a. 12/13 ECHO: EF 35-40%, sept, apical & posterobasal HK, LV mod dil & sys fx mod reduced, mild  AI, MV mild reg, TV mild reg  . Complication of anesthesia    "difficult to wake afterwards a couple of times"  . 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 Dyspnea   . Family history of adverse reaction to anesthesia    "sister hard to wake up"  . GERD (gastroesophageal reflux disease)   . Gout   . Heart murmur   . HLD (hyperlipidemia)   . HTN (hypertension)    severe  . Myocardial infarction [facility])    "many" (11/07/2016)  . PONV (postoperative nausea and vomiting)   . Sleep apnea    "never RX'd mask" (11/07/2016)  . Thyroid cancer ([place])    Hertle Cell    Current Outpatient Medications  Medication Sig Dispense Refill  . albuterol (PROVENTIL HFA;VENTOLIN HFA) 108 (90 Base) MCG/ACT inhaler Inhale 2 puffs into the lungs every 6 (six) hours as needed for wheezing or shortness of breath. 1 Inhaler 0  . carvedilol (COREG) 25 MG tablet TAKE ONE TABLET BY MOUTH TWICE DAILY WITH A MEAL 60 tablet 6  . clopidogrel (PLAVIX) 75 MG tablet TAKE ONE TABLET BY MOUTH DAILY 90 tablet 3  . cyclobenzaprine (FLEXERIL) 10 MG tablet Take 10 mg by mouth daily as needed for muscle spasms.     . diphenhydrAMINE (BENADRYL) 25 MG tablet Take 25 mg by mouth every 6 (six) hours as needed for allergies.    [name] ENTRESTO 97-103 MG TAKE 1 TABLET BY MOUTH TWICE DAILY 60 tablet 3  . furosemide (LASIX) 40 MG tablet Take 80 mg by mouth 2 (two) times daily.    . hydrALAZINE (APRESOLINE) 50 MG tablet Take 75 mg by mouth 2 (two) times daily.    [name] ibuprofen (ADVIL,MOTRIN) 200 MG tablet Take 200 mg by mouth every 6 (six) hours as needed for moderate pain.    [name] ipratropium-albuterol (DUONEB) 0.5-2.5 (3) MG/3ML SOLN Take 3 mLs by nebulization every 6 (six) hours as needed. 360 mL 3  . isosorbide mononitrate (IMDUR) 60 MG 24 hr tablet TAKE ONE TABLET BY MOUTH TWICE DAILY 60 tablet 6  . naphazoline (NAPHCON) 0.1 % ophthalmic solution Place 1 drop into both eyes 4 (four)  times daily as needed for irritation.    . nitroGLYCERIN (NITROSTAT) 0.4 MG SL tablet PLACE ONE TABLET UNDER THE TONGUE EVERY 5 MINUTES FOR 3 DOSES AS NEEDED FOR CHEST PAIN 25 tablet 12  . Oxycodone HCl 10 MG TABS Take 20 mg by mouth 3 (three) times daily.     . potassium chloride (KLOR-CON M10) 10 MEQ tablet Take 1 tablet (10 mEq total) by mouth daily. 30 tablet 6  . sacubitril-valsartan (ENTRESTO) 97-103 MG Take 1 tablet by mouth 2 (two) times daily.    . sildenafil (VIAGRA) 100 MG tablet TAKE 1/2 TABLET BY MOUTH DAILY  AS NEEDED FOR ERECTILE DYSFUNCTION 10 tablet 0  . Spacer/Aero-Holding Chambers (AEROCHAMBER PLUS) inhaler Use as instructed 1 each 0  . spironolactone (ALDACTONE) 25 MG tablet Take 0.5 tablets (12.5 mg total) by mouth daily. (Patient taking differently: Take 12.5 mg by mouth at bedtime. ) 30 tablet 6   No current facility-administered medications for this encounter.     Allergies  Allergen Reactions  . Iohexol Anaphylaxis and Other (See Comments)     Desc: PT HAS ANAPHYLAXIS WITH CONTRAST MEDIA!  . Lipitor [Atorvastatin Calcium] Anaphylaxis and Other (See Comments)    Large doses  . Shellfish Allergy Anaphylaxis  . Sulfa Antibiotics Shortness Of Breath and Swelling  . Sulfonamide Derivatives Shortness Of Breath and Swelling  . Metrizamide Swelling  . Latex Rash    With long periods of exposure  . Zocor [Simvastatin] Other (See Comments)    Muscle cramps    Vital Signs: Vitals:   03/25/18 1542  BP: 128/82  Pulse: 74  SpO2: 97%  Weight: 171 lb (77.6 kg)   Filed Weights   03/25/18 1542  Weight: 171 lb (77.6 kg)   Physical Exam: General:  Thin weak appearing NAD HEENT: normal Neck: supple. JVP 5-6 Carotids 2+ bilat; + bruits. No lymphadenopathy or thryomegaly appreciated. Cor: PMI laterally displaced. Regular rate & rhythm. 3/6 high pitched musical murmur at RUSB radiating across precordium 2/6 MR Lungs: clear Abdomen: soft, nontender, nondistended. No  hepatosplenomegaly. No bruits or masses. Good bowel sounds. Extremities: no cyanosis, clubbing, rash, edema Neuro: alert & orientedx3, cranial nerves grossly intact. moves all 4 extremities w/o difficulty. Affect pleasant   ASSESSMENT/Plan  1. Chronic systolic HF: Ischemic cardiomyopathy.  Echo (2/16) with EF 25%. S/p [employer] ICD. Echo 3/18. EF 20-25% - He is much worse. Now NYHA IIIB. Have suggested repeat CPX but he feels he is too weak to walk. Will get repeat echo and plan RHC as soon as possible. - He has seen w Dr. Prescott Gum. Given previous dissection and aortic root replacement likely not candidate for VAD. - DHas been seen previously at [facility] for transplant eval. May need f/u - Continue Entresto 97/103 bid - Continue carvedilol 25 mg twice a day.  - Continue hydralazine 75 mg bid/ Imdur 60 bid.  - Continue spironolactone 12.5 mg daily.    2. CAD: s/p CABG.   - No recent s/s ischemia  -Cardiolite 11/2015 with evidence for mild reversibility involving the anterolateral wall and a portion of the lateral wall. - We have avoided left heart cath due to dissection and CKD with solitary kidney (lost kidney due to dissection) - Continue Plavix and ASA 81 mg daily.   - Statin intolerance at any dose.  - Continue Zetia. - PCP follows lipids. If LDL remains elevated, will consider referral to Lipid Clinic to consider PCSK-9  3. CKD in setting of solitary kidney s/p dissection - recheck labs today 4. HTN: - stable. Well controlled on current regimen 5. H/o Type I aortic dissection: Follows with CVTS.  - He has seen w Dr. Prescott Gum. Given previous dissection and aortic root replacement likely not candidate for VAD. - Repeat echo next visit 6. NSVT - ICD interrogated personally. No VT.   Total time spent 45 minutes. Over half that time spent discussing above.     Benay Spice 3:58 PM

## 2018-03-27 ENCOUNTER — Other Ambulatory Visit (HOSPITAL_COMMUNITY): Payer: Self-pay | Admitting: *Deleted

## 2018-03-27 DIAGNOSIS — I5022 Chronic systolic (congestive) heart failure: Secondary | ICD-10-CM

## 2018-03-28 ENCOUNTER — Other Ambulatory Visit (HOSPITAL_COMMUNITY): Payer: Self-pay | Admitting: *Deleted

## 2018-03-28 MED ORDER — FUROSEMIDE 80 MG PO TABS: 80.0000 mg | ORAL_TABLET | Freq: Two times a day (BID) | ORAL | 6 refills | Status: DC

## 2018-03-29 ENCOUNTER — Ambulatory Visit: Payer: Self-pay | Admitting: General Surgery

## 2018-03-29 NOTE — H&P (Signed)
History of Present Illness Chad Ok MD; 03/29/2018 3:55 PM) The patient is a 57 year old male who presents with an inguinal hernia. Patient follows back up after meeting with Dr. Roni Bread for his evaluation of his hydroceles. The patient continues to have discomfort left side more than right. He does appear to have a small communicating hydrocele the right inguinal canal. This is likely related to the hernia. Patient has seen Dr. Tempie Hoist for cardiac evaluation. We're currently waiting the results of that however the patient does state that he was cleared to hold his Plavix and cleared from a cardiac standpoint.   --------------------------------------------------------- Referred by: Dr. Emmaline Life Chief Complaint: Bilateral inguinal hernias  Patient is a 57 year old male with a history of previous CABG, on Plavix, history of hypertension, comes in with bilateral inguinal hernias. Patient's cardiologist is Dr. Anabel Bene Patient was examined approximate years ago and was scheduled for inguinal hernias surgery and surgery was canceled from the OR secondary to no preoperative cardiac clearance/telemonitoring.  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 He states that this receded to his inguinal canal. He states that he is always noticed a bulge to the left inguinal area and now notices a mass in the left scrotum. He states he has pain to the left inguinal area. He also has a right inguinal hernia easily palpable. He does have a testicle in the right scrotum. Patient states she's never had any imaging of the scrotum. He does state that he is able to reduce the mass in the left scrotum, likely be of the inguinal canal.  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    Allergies Erline Levine, RN; 03/29/2018 3:27 PM) Allergies Reconciled   Medication History Erline Levine, RN; 03/29/2018 3:28 PM) Delene Loll (97-103MG  Tablet, Oral)  Active. Flexeril (5MG  Tablet, Oral as needed) Active. HydrALAZINE HCl (50MG  Tablet, Oral) Active. Isosorbide Mononitrate (60MG  Tablet ER, Oral) Active. Spironolactone (25MG  Tablet, Oral) Active. B Complex (Oral) Active. Coreg (25MG  Tablet, Oral) Active. Plavix (75MG  Tablet, Oral) Active. Lasix (40MG  Tablet, Oral) Active. Multiple Vitamin (Oral) Active. Nitrostat (0.4MG  Tab Sublingual, Sublingual) Active. [facility] Tablet ER, Oral) Active. OxyCODONE HCl ER (10MG  Tablet ER 12HR, Oral) Active. Medications Reconciled    Review of Systems Chad Ok, MD; 03/29/2018 3:56 PM) All other systems negative  Vitals Erline Levine RN; 03/29/2018 3:29 PM) 03/29/2018 3:28 PM Weight: 173.25 lb Height: 71in Body Surface Area: 1.98 m Body Mass Index: 24.16 kg/m  Temp.: 54F(Oral)  Pulse: 70 (Regular)  P.OX: 98% (Room air) BP: 120/70 (Sitting, Left Arm, Standard)       Physical Exam Chad Ok, MD; 03/29/2018 3:56 PM) The physical exam findings are as follows: Note: Constitutional: No acute distress, conversant, appears stated age  Eyes: Anicteric sclerae, moist conjunctiva, no lid lag  Neck: No thyromegaly, trachea midline, no cervical lymphadenopathy  Lungs: Clear to auscultation biilaterally, normal respiratory effot  Cardiovascular: regular rate & rhythm, no murmurs, no peripheal edema, pedal pulses 2+  GI: Soft, no masses or hepatosplenomegaly, non-tender to palpation  MSK: Normal gait, no clubbing cyanosis, edema  Skin: No rashes, palpation reveals normal skin turgor  Psychiatric: Appropriate judgment and insight, oriented to person, place, and time  Abdomen Inspection Hernias - Inguinal hernia - Bilateral - Reducible(Patient has an easily palpable right inguinal hernia Patient has a mass in his left hemiscrotum. This appears to be soft, and this also appears to reduce and she lays down.  I believe this could be a remnant from his  previous left testicular rupture).    Assessment & Plan Chad Ok MD; 03/29/2018 3:56 PM) BILATERAL INGUINAL HERNIA WITHOUT OBSTRUCTION OR GANGRENE, RECURRENCE NOT SPECIFIED (K40.20) Impression: 57 year old male , with history of CABG, hypertension, on Plavix, with bilateral inguinal hernias  1. The patient will like to proceed to the operating room for laparoscopic bilateral versus open inguinal hernia repair with mesh.  2. I discussed with the patient the signs and symptoms of incarceration and strangulation and the need to proceed to the ER should they occur.  3. I discussed with the patient the risks and benefits of the procedure to include but not limited to: Infection, bleeding, damage to surrounding structures, possible need for further surgery, possible nerve pain, and possible recurrence. The patient was understanding and wishes to proceed.

## 2018-04-01 ENCOUNTER — Ambulatory Visit (HOSPITAL_COMMUNITY): Admission: RE | Admit: 2018-04-01 | Payer: Medicaid Other | Source: Ambulatory Visit | Admitting: Internal Medicine

## 2018-04-01 ENCOUNTER — Encounter (HOSPITAL_COMMUNITY): Admission: RE | Payer: Self-pay | Source: Ambulatory Visit

## 2018-04-01 ENCOUNTER — Ambulatory Visit (HOSPITAL_COMMUNITY): Admission: RE | Admit: 2018-04-01 | Payer: Medicaid Other | Source: Ambulatory Visit

## 2018-04-01 SURGERY — RIGHT HEART CATH
Anesthesia: LOCAL

## 2018-04-05 ENCOUNTER — Ambulatory Visit (HOSPITAL_COMMUNITY)
Admission: RE | Admit: 2018-04-05 | Discharge: 2018-04-05 | Disposition: A | Discharge status: Home / Self Care | Payer: Medicaid Other | Source: Ambulatory Visit | Attending: Internal Medicine | Admitting: Internal Medicine

## 2018-04-05 ENCOUNTER — Encounter (HOSPITAL_COMMUNITY): Payer: Self-pay | Admitting: *Deleted

## 2018-04-05 ENCOUNTER — Encounter (HOSPITAL_COMMUNITY)
Admission: RE | Disposition: A | Discharge status: Home / Self Care | Payer: Self-pay | Source: Ambulatory Visit | Attending: Internal Medicine

## 2018-04-05 DIAGNOSIS — Z95810 Presence of automatic (implantable) cardiac defibrillator: Secondary | ICD-10-CM | POA: Insufficient documentation

## 2018-04-05 DIAGNOSIS — J45909 Unspecified asthma, uncomplicated: Secondary | ICD-10-CM | POA: Diagnosis not present

## 2018-04-05 DIAGNOSIS — Z7902 Long term (current) use of antithrombotics/antiplatelets: Secondary | ICD-10-CM | POA: Insufficient documentation

## 2018-04-05 DIAGNOSIS — Z7982 Long term (current) use of aspirin: Secondary | ICD-10-CM | POA: Diagnosis not present

## 2018-04-05 DIAGNOSIS — I6523 Occlusion and stenosis of bilateral carotid arteries: Secondary | ICD-10-CM | POA: Insufficient documentation

## 2018-04-05 DIAGNOSIS — Z951 Presence of aortocoronary bypass graft: Secondary | ICD-10-CM | POA: Diagnosis not present

## 2018-04-05 DIAGNOSIS — G4730 Sleep apnea, unspecified: Secondary | ICD-10-CM | POA: Insufficient documentation

## 2018-04-05 DIAGNOSIS — K219 Gastro-esophageal reflux disease without esophagitis: Secondary | ICD-10-CM | POA: Diagnosis not present

## 2018-04-05 DIAGNOSIS — I130 Hypertensive heart and chronic kidney disease with heart failure and stage 1 through stage 4 chronic kidney disease, or unspecified chronic kidney disease: Secondary | ICD-10-CM | POA: Insufficient documentation

## 2018-04-05 DIAGNOSIS — I472 Ventricular tachycardia: Secondary | ICD-10-CM | POA: Insufficient documentation

## 2018-04-05 DIAGNOSIS — I2510 Atherosclerotic heart disease of native coronary artery without angina pectoris: Secondary | ICD-10-CM | POA: Diagnosis not present

## 2018-04-05 DIAGNOSIS — Z882 Allergy status to sulfonamides status: Secondary | ICD-10-CM | POA: Diagnosis not present

## 2018-04-05 DIAGNOSIS — G8929 Other chronic pain: Secondary | ICD-10-CM | POA: Insufficient documentation

## 2018-04-05 DIAGNOSIS — I252 Old myocardial infarction: Secondary | ICD-10-CM | POA: Insufficient documentation

## 2018-04-05 DIAGNOSIS — Z955 Presence of coronary angioplasty implant and graft: Secondary | ICD-10-CM | POA: Diagnosis not present

## 2018-04-05 DIAGNOSIS — I255 Ischemic cardiomyopathy: Secondary | ICD-10-CM | POA: Diagnosis not present

## 2018-04-05 DIAGNOSIS — E785 Hyperlipidemia, unspecified: Secondary | ICD-10-CM | POA: Insufficient documentation

## 2018-04-05 DIAGNOSIS — N189 Chronic kidney disease, unspecified: Secondary | ICD-10-CM | POA: Insufficient documentation

## 2018-04-05 DIAGNOSIS — M109 Gout, unspecified: Secondary | ICD-10-CM | POA: Diagnosis not present

## 2018-04-05 DIAGNOSIS — M545 Low back pain: Secondary | ICD-10-CM | POA: Insufficient documentation

## 2018-04-05 DIAGNOSIS — Z91040 Latex allergy status: Secondary | ICD-10-CM | POA: Insufficient documentation

## 2018-04-05 DIAGNOSIS — I5022 Chronic systolic (congestive) heart failure: Secondary | ICD-10-CM | POA: Diagnosis not present

## 2018-04-05 HISTORY — PX: RIGHT HEART CATH: CATH118263

## 2018-04-05 LAB — POCT I-STAT 3, VENOUS BLOOD GAS (G3P V)
BICARBONATE: 25.6 mmol/L (ref 20.0–28.0)
BICARBONATE: 25.7 mmol/L (ref 20.0–28.0)
O2 SAT: 53 %
O2 SAT: 55 %
PCO2 VEN: 46.7 mmHg (ref 44.0–60.0)
TCO2: 27 mmol/L (ref 22–32)
TCO2: 27 mmol/L (ref 22–32)
pCO2, Ven: 45.4 mmHg (ref 44.0–60.0)
pH, Ven: 7.347 (ref 7.250–7.430)
pH, Ven: 7.361 (ref 7.250–7.430)
pO2, Ven: 30 mmHg — CL (ref 32.0–45.0)
pO2, Ven: 30 mmHg — CL (ref 32.0–45.0)

## 2018-04-05 SURGERY — RIGHT HEART CATH
Anesthesia: LOCAL

## 2018-04-05 MED ORDER — LIDOCAINE HCL (PF) 1 % IJ SOLN
INTRAMUSCULAR | Status: AC
  Filled 2018-04-05: qty 30

## 2018-04-05 MED ORDER — SODIUM CHLORIDE 0.9% FLUSH: 3.0000 mL | INTRAVENOUS | Status: DC | PRN

## 2018-04-05 MED ORDER — MIDAZOLAM HCL 2 MG/2ML IJ SOLN
INTRAMUSCULAR | Status: AC
  Filled 2018-04-05: qty 2

## 2018-04-05 MED ORDER — SODIUM CHLORIDE 0.9 % IV SOLN: 250.0000 mL | INTRAVENOUS | Status: DC | PRN

## 2018-04-05 MED ORDER — HEPARIN (PORCINE) IN NACL 2-0.9 UNIT/ML-% IJ SOLN
INTRAMUSCULAR | Status: AC
  Filled 2018-04-05: qty 500

## 2018-04-05 MED ORDER — ACETAMINOPHEN 325 MG PO TABS: 650.0000 mg | ORAL_TABLET | ORAL | Status: DC | PRN

## 2018-04-05 MED ORDER — FENTANYL CITRATE (PF) 100 MCG/2ML IJ SOLN
INTRAMUSCULAR | Status: DC | PRN
  Administered 2018-04-05: 25 ug via INTRAVENOUS

## 2018-04-05 MED ORDER — ONDANSETRON HCL 4 MG/2ML IJ SOLN: 4.0000 mg | Freq: Four times a day (QID) | INTRAMUSCULAR | Status: DC | PRN

## 2018-04-05 MED ORDER — SODIUM CHLORIDE 0.9% FLUSH: 3.0000 mL | Freq: Two times a day (BID) | INTRAVENOUS | Status: DC

## 2018-04-05 MED ORDER — SODIUM CHLORIDE 0.9 % IV SOLN
INTRAVENOUS | Status: DC
  Administered 2018-04-05: 11:00:00 via INTRAVENOUS

## 2018-04-05 MED ORDER — MIDAZOLAM HCL 2 MG/2ML IJ SOLN
INTRAMUSCULAR | Status: DC | PRN
  Administered 2018-04-05: 1 mg via INTRAVENOUS

## 2018-04-05 MED ORDER — HEPARIN (PORCINE) IN NACL 2-0.9 UNIT/ML-% IJ SOLN
INTRAMUSCULAR | Status: AC | PRN
  Administered 2018-04-05: 500 mL via INTRA_ARTERIAL

## 2018-04-05 MED ORDER — LIDOCAINE HCL (PF) 1 % IJ SOLN
INTRAMUSCULAR | Status: DC | PRN
  Administered 2018-04-05: 2 mL

## 2018-04-05 MED ORDER — FENTANYL CITRATE (PF) 100 MCG/2ML IJ SOLN
INTRAMUSCULAR | Status: AC
  Filled 2018-04-05: qty 2

## 2018-04-05 SURGICAL SUPPLY — 9 items
CATH BALLN WEDGE 5F 110CM (CATHETERS) ×2 IMPLANT
PACK CARDIAC CATHETERIZATION (CUSTOM PROCEDURE TRAY) ×2 IMPLANT
PROTECTION STATION PRESSURIZED (MISCELLANEOUS) ×2
SHEATH AVANTI 11CM 5FR (SHEATH) IMPLANT
SHEATH RAIN 4/5FR (SHEATH) ×2 IMPLANT
STATION PROTECTION PRESSURIZED (MISCELLANEOUS) ×1 IMPLANT
TRANSDUCER W/STOPCOCK (MISCELLANEOUS) ×4 IMPLANT
TUBING ART PRESS 72  MALE/MALE (TUBING) ×2 IMPLANT
TUBING CIL FLEX 10 FLL-RA (TUBING) ×2 IMPLANT

## 2018-04-05 NOTE — Interval H&P Note (Signed)
History and Physical Interval Note:  04/05/2018 11:21 AM  Lucas Laurice Record.  has presented today for surgery, with the diagnosis of Heart Failure  The various methods of treatment have been discussed with the patient and family. After consideration of risks, benefits and other options for treatment, the patient has consented to  Procedure(s): RIGHT HEART CATH (N/A) as a surgical intervention .  The patient's history has been reviewed, patient examined, no change in status, stable for surgery.  I have reviewed the patient's chart and labs.  Questions were answered to the patient's satisfaction.     Daniel Bensimhon

## 2018-04-05 NOTE — Discharge Instructions (Signed)
Call Dr Bensimhon's office if any problems,questions , or concerns; call if any redness, drainage, fever, pain, swelling or bleeding right arm site 

## 2018-04-07 ENCOUNTER — Other Ambulatory Visit: Payer: Self-pay | Admitting: Internal Medicine

## 2018-04-07 ENCOUNTER — Encounter (HOSPITAL_COMMUNITY): Payer: Self-pay | Admitting: Emergency Medicine

## 2018-04-07 ENCOUNTER — Emergency Department (HOSPITAL_COMMUNITY)
Admission: EM | Admit: 2018-04-07 | Discharge: 2018-04-07 | Disposition: A | Discharge status: Home / Self Care | Payer: Medicaid Other | Attending: Emergency Medicine | Admitting: Emergency Medicine

## 2018-04-07 ENCOUNTER — Other Ambulatory Visit: Payer: Self-pay

## 2018-04-07 DIAGNOSIS — N183 Chronic kidney disease, stage 3 (moderate): Secondary | ICD-10-CM | POA: Insufficient documentation

## 2018-04-07 DIAGNOSIS — I2510 Atherosclerotic heart disease of native coronary artery without angina pectoris: Secondary | ICD-10-CM | POA: Diagnosis not present

## 2018-04-07 DIAGNOSIS — Z955 Presence of coronary angioplasty implant and graft: Secondary | ICD-10-CM | POA: Diagnosis not present

## 2018-04-07 DIAGNOSIS — Z79899 Other long term (current) drug therapy: Secondary | ICD-10-CM | POA: Diagnosis not present

## 2018-04-07 DIAGNOSIS — M79671 Pain in right foot: Secondary | ICD-10-CM | POA: Diagnosis present

## 2018-04-07 DIAGNOSIS — Z91040 Latex allergy status: Secondary | ICD-10-CM | POA: Diagnosis not present

## 2018-04-07 DIAGNOSIS — I5022 Chronic systolic (congestive) heart failure: Secondary | ICD-10-CM | POA: Insufficient documentation

## 2018-04-07 DIAGNOSIS — I130 Hypertensive heart and chronic kidney disease with heart failure and stage 1 through stage 4 chronic kidney disease, or unspecified chronic kidney disease: Secondary | ICD-10-CM | POA: Diagnosis not present

## 2018-04-07 DIAGNOSIS — M10071 Idiopathic gout, right ankle and foot: Secondary | ICD-10-CM | POA: Diagnosis not present

## 2018-04-07 DIAGNOSIS — Z951 Presence of aortocoronary bypass graft: Secondary | ICD-10-CM | POA: Insufficient documentation

## 2018-04-07 MED ORDER — HYDROCODONE-ACETAMINOPHEN 5-325 MG PO TABS: 1.0000 | ORAL_TABLET | Freq: Four times a day (QID) | ORAL | 0 refills | Status: DC | PRN

## 2018-04-07 MED ORDER — PREDNISONE 20 MG PO TABS
60.0000 mg | ORAL_TABLET | ORAL | Status: AC
  Administered 2018-04-07: 60 mg via ORAL
  Filled 2018-04-07: qty 3

## 2018-04-07 MED ORDER — OXYCODONE-ACETAMINOPHEN 5-325 MG PO TABS
1.0000 | ORAL_TABLET | Freq: Once | ORAL | Status: AC
  Administered 2018-04-07: 1 via ORAL
  Filled 2018-04-07: qty 1

## 2018-04-07 MED ORDER — PREDNISONE 20 MG PO TABS: 40.0000 mg | ORAL_TABLET | Freq: Every day | ORAL | 0 refills | Status: DC

## 2018-04-07 NOTE — ED Provider Notes (Signed)
[facility] EMERGENCY DEPARTMENT Provider Note   CSN: [id number] Arrival date & time: 04/07/18  0856     History   Chief Complaint Chief Complaint  Patient presents with  . Gout    HPI Chad [name]. is a 57 y.o. male.  HPI Patient presents with concern of pain in his right great toe. Onset is been about a week ago, and since onset symptoms been persistent, with no relief in spite of multiple OTC medication Patient has a history of gout, states that this is similar to prior gout flares. No other complaints including distal loss of sensation, fever, nausea, vomiting.  Past Medical History:  Diagnosis Date  . AICD (automatic cardioverter/defibrillator) present   . Anemia   . Anginal pain ([place])   . Aortic dissection, thoracoabdominal ([place])    7/10: Type I s/p repair  . Asthma   . CAD (coronary artery disease)    a. s/p CABG 2006;  b. DES to PDA 2011 (cath: Dx not seen, dRCA/PDA tx with DES; S-PDA occluded (culprit), S-Dx occluded, S-RI and OM ok, L-LAD ok  . Carotid stenosis    dopplers 2011: 0-39% bilat.  . Chest pain syndrome   . CHF (congestive heart failure) ([place])   . Chronic bronchitis ([place])   . Chronic lower back pain   . Chronic systolic heart failure (HCC)    a. 12/13 ECHO: EF 35-40%, sept, apical & posterobasal HK, LV mod dil & sys fx mod reduced, mild AI, MV mild reg, TV mild reg  . Complication of anesthesia    "difficult to wake afterwards a couple of times"  . 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 Dyspnea   . Family history of adverse reaction to anesthesia    "sister hard to wake up"  . GERD (gastroesophageal reflux disease)   . Gout   . Heart murmur   . HLD (hyperlipidemia)   . HTN (hypertension)    severe  . Myocardial infarction [facility])    "many" (11/07/2016)  . PONV (postoperative nausea and vomiting)   . Sleep apnea    "never RX'd mask" (11/07/2016)  .  Thyroid cancer ([place])    H[employer] Cell    Patient Active Problem List   Diagnosis Date Noted  . Inguinal hernia 02/18/2018  . Prediabetes 12/20/2017  . Renal insufficiency   . Hypokalemia   . CHF exacerbation ([place]) 12/15/2017  . Failure of implantable cardioverter-defibrillator (ICD) lead 11/07/2016  . Dyspnea 05/12/2016  . Erectile dysfunction 04/18/2016  . Pain in the chest   . Aneurysm of descending thoracic aorta ([place])   . Nasal folliculitis 08/65/7846  . Chronic systolic CHF (congestive heart failure) ([place]) 04/12/2015  . CKD (chronic kidney disease) stage 3, GFR 30-59 ml/min (HCC) 04/12/2015  . Unstable angina ([place]) 01/25/2015  . Health care maintenance 03/19/2014  . Local reaction to tetanus vaccine 03/19/2014  . Neck pain on left side 06/03/2013  . Penile discharge 02/26/2013  . Syncope 01/17/2013  . Acute on chronic systolic and diastolic heart failure, NYHA class 3 ([place]) 01/16/2013  . Low back pain 04/23/2012  . Chest pain syndrome   . Insomnia 09/14/2011  . 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 Coronary atherosclerosis of native coronary artery 06/12/2011  . Bradycardia 05/03/2011  . Aortic dissection, thoracoabdominal ([place])   . Chronic kidney disease (CKD), stage II (mild)   . ORTHOSTATIC HYPOTENSION 05/03/2010  .  CAROTID BRUIT 01/25/2010  . PANCYTOPENIA 11/01/2009  . Fatigue 10/25/2009  . DISSECTING AORTIC ANEURYSM THORACOABDOMINAL 07/14/2009  . SCIATICA, RIGHT 05/18/2009  . HYPERLIPIDEMIA-MIXED 09/29/2008  . HYPERTENSION, BENIGN 09/29/2008  . CAD, ARTERY BYPASS GRAFT 09/29/2008  . SYSTOLIC HEART FAILURE, CHRONIC 09/29/2008  . ICD - IN SITU 09/29/2008    Past Surgical History:  Procedure Laterality Date  . BACK SURGERY    . CARDIAC CATHETERIZATION     "several" (11/07/2016)  . CARDIAC DEFIBRILLATOR PLACEMENT  11/2005   [employer]; Archie Endo 05/09/2011  . CORONARY ANGIOPLASTY WITH STENT PLACEMENT     "I've had 1-2  stents" (11/07/2016)  . CORONARY ARTERY BYPASS GRAFT  06/21/2009   "CABG X2"  . CORONARY ARTERY BYPASS GRAFT  06/29/2005   "CABG X7"  . ICD LEAD REMOVAL  11/07/2016  . ICD LEAD REMOVAL N/A 11/07/2016   Procedure: ICD LEAD REMOVAL, INSERTION OF NEW ICD LEAD;  Surgeon: Evans Lance, MD;  Location: [facility];  Service: Cardiovascular;  Laterality: N/A;  Dr. Prescott Gum to backup case  . IMPLANTABLE CARDIOVERTER DEFIBRILLATOR (ICD) GENERATOR CHANGE N/A 12/13/2012   Procedure: ICD GENERATOR CHANGE;  Surgeon: Evans Lance, MD;  Location[facility] C[facility] LAB;  Service: Cardiovascular;  Laterality: N/A;  . LUMBAR [place] SURGERY  01/2001    most recent within 5-10 years  . RIGHT HEART CATH N/A 04/05/2018   Procedure: RIGHT HEART CATH;  Surgeon: Jolaine Artist, MD;  Location: [facility] CV LAB;  Service: Cardiovascular;  Laterality: N/A;  . SHOULDER ARTHROSCOPY WITH ROTATOR CUFF REPAIR Bilateral   . Status post emergency repair of a type A ascending aortic dissection with a hemiarch reconstruction of the ascending aorta  using a 28-mm Hemashield graft with redo sternotomy and revision of previous bypass grafts in June 2010.    . TESTICLE SURGERY    . THYROIDECTOMY, PARTIAL  06/20/2011  . VASECTOMY          Home Medications    Prior to Admission medications   Medication Sig Start Date End Date Taking? Authorizing Provider  albuterol (PROVENTIL HFA;VENTOLIN HFA) 108 (90 Base) MCG/ACT inhaler Inhale 2 puffs into the lungs every 6 (six) hours as needed for wheezing or shortness of breath. 10/19/17   Mikell, Jeani Sow, MD  B Complex-C (SUPER B COMPLEX PO) Take 2 tablets by mouth daily.    [provider]  carvedilol (COREG) 25 MG tablet TAKE ONE TABLET BY MOUTH TWICE DAILY WITH A MEAL Patient taking differently: TAKE 25 MG BY MOUTH TWICE DAILY WITH A MEAL 11/09/17   Bensimhon, Shaune Pascal, MD  clopidogrel (PLAVIX) 75 MG tablet TAKE ONE TABLET BY MOUTH DAILY Patient taking differently: TAKE 75 MG  BY MOUTH DAILY 08/01/17   Bensimhon, Shaune Pascal, MD  cyclobenzaprine (FLEXERIL) 10 MG tablet Take 10 mg by mouth daily as needed for muscle spasms.     [provider]  diphenhydrAMINE (BENADRYL) 25 MG tablet Take 25 mg by mouth every 6 (six) hours as needed for allergies.    [provider]  fluticasone (FLONASE) 50 MCG/ACT nasal spray Place 2 sprays into both nostrils daily as needed for allergies or rhinitis.    [provider]  furosemide (LASIX) 80 MG tablet Take 1 tablet (80 mg total) by mouth 2 (two) times daily. 03/28/18   Bensimhon, Shaune Pascal, MD  hydrALAZINE (APRESOLINE) 50 MG tablet Take 75 mg by mouth 2 (two) times daily.    [provider]  ibuprofen (ADVIL,MOTRIN) 200 MG  tablet Take 800 mg by mouth every 6 (six) hours as needed for moderate pain.     [provider]  ipratropium-albuterol (DUONEB) 0.5-2.5 (3) MG/3ML SOLN Take 3 mLs by nebulization every 6 (six) hours as needed. Patient taking differently: Take 3 mLs by nebulization every 6 (six) hours as needed (for shortness of breath or wheezing).  12/20/17   Lovenia Kim, MD  isosorbide mononitrate (IMDUR) 60 MG 24 hr tablet TAKE ONE TABLET BY MOUTH TWICE DAILY Patient taking differently: TAKE 60 MG BY MOUTH TWICE DAILY 12/10/17   Bensimhon, Shaune Pascal, MD  naphazoline (NAPHCON) 0.1 % ophthalmic solution Place 1 drop into both eyes 4 (four) times daily as needed for irritation.    [provider]  nitroGLYCERIN (NITROSTAT) 0.4 MG SL tablet PLACE ONE TABLET UNDER THE TONGUE EVERY 5 MINUTES FOR 3 DOSES AS NEEDED FOR CHEST PAIN Patient taking differently: PLACE 0.4 MG UNDER THE TONGUE EVERY 5 MINUTES FOR 3 DOSES AS NEEDED FOR CHEST PAIN 07/02/17   Bensimhon, Shaune Pascal, MD  Oxycodone HCl 10 MG TABS Take 20 mg by mouth every 8 (eight) hours.     [provider]  potassium chloride (KLOR-CON M10) 10 MEQ tablet Take 1 tablet (10 mEq total) by mouth daily. Patient taking differently: Take  10 mEq by mouth daily as needed (for cramps).  12/20/17   Lovenia Kim, MD  sacubitril-valsartan (ENTRESTO) 97-103 MG Take 1 tablet by mouth 2 (two) times daily.    [provider]  sildenafil (VIAGRA) 100 MG tablet TAKE 1/2 TABLET BY MOUTH DAILY AS NEEDED FOR ERECTILE DYSFUNCTION Patient taking differently: TAKE 50 MG BY MOUTH DAILY AS NEEDED FOR ERECTILE DYSFUNCTION 02/19/18   Bensimhon, Shaune Pascal, MD  Spacer/Aero-Holding Chambers (AEROCHAMBER PLUS) inhaler Use as instructed 05/12/16   Olam Idler, MD  spironolactone (ALDACTONE) 25 MG tablet Take 0.5 tablets (12.5 mg total) by mouth daily. Patient taking differently: Take 12.5 mg by mouth at bedtime.  01/31/17   Bensimhon, Shaune Pascal, MD    Family History Family History  Problem Relation Age of Onset  . Hypertension Father   . Heart disease Father   . Early death Father   . COPD Father   . Hypertension Mother   . Coronary artery disease Unknown     Social History Social History   Tobacco Use  . Smoking status: Never Smoker  . Smokeless tobacco: Never Used  Substance Use Topics  . Alcohol use: No  . Drug use: No     Allergies   Iohexol; Lipitor [atorvastatin calcium]; Shellfish allergy; Sulfa antibiotics; Sulfonamide derivatives; Metrizamide; Latex; and Zocor [simvastatin]   Review of Systems Review of Systems  Constitutional:       Per HPI, otherwise negative  HENT:       Per HPI, otherwise negative  Respiratory:       Per HPI, otherwise negative  Cardiovascular:       Per HPI, otherwise negative  Gastrointestinal: Negative for vomiting.  Endocrine:       Negative aside from HPI  Genitourinary:       1 kidney from prior aortic dissection  Musculoskeletal:       Per HPI, otherwise negative  Skin: Negative.  Negative for wound.  Neurological: Negative for syncope.     Physical Exam Updated Vital Signs BP 110/78 (BP Location: Right Arm)   Pulse 70   Temp (!) 97.5 F (36.4 C) (Oral)   Resp 16    SpO2 100%  Physical Exam  Constitutional: He is oriented to person, place, and time. He appears well-developed. No distress.  HENT:  Head: Normocephalic and atraumatic.  Eyes: Conjunctivae and EOM are normal.  Cardiovascular: Normal rate, regular rhythm and intact distal pulses.  Pulmonary/Chest: Effort normal. No respiratory distress.  Musculoskeletal: He exhibits no edema.       Feet:  Neurological: He is alert and oriented to person, place, and time.  Skin: Skin is warm and dry.  Psychiatric: He has a normal mood and affect.  Nursing note and vitals reviewed.    ED Treatments / Results   Procedures Procedures (including critical care time)  Medications Ordered in ED Medications  oxyCODONE-acetaminophen (PERCOCET/ROXICET) 5-325 MG per tablet 1 tablet (has no administration in time range)  predniSONE (DELTASONE) tablet 60 mg (has no administration in time range)     Initial Impression / Assessment and Plan / ED Course  I have reviewed the triage vital signs and the nursing notes.  Pertinent labs & imaging results that were available during my care of the patient were reviewed by me and considered in my medical decision making (see chart for details).  Patient with a history of gout presents with MTP pain on the right foot. No warmth, no discharge, no distal loss of sensation or strength in no, vital sign evidence for septic arthritis, Bactrim, sepsis. Patient started on analgesia, steroids given his unilateral kidney, consideration given to renal clearance. Patient discharged with orthopedics follow-up.  Final Clinical Impressions(s) / ED Diagnoses  Foot pain   Carmin Muskrat, MD 04/07/18 1213

## 2018-04-07 NOTE — ED Triage Notes (Signed)
Pt. Stated, Donnald Garre had gout for a week on my right foot.

## 2018-04-07 NOTE — ED Triage Notes (Signed)
PT requested to have his BP checked .because he has chronic high BP . BP checked 114/80

## 2018-04-11 ENCOUNTER — Telehealth (HOSPITAL_COMMUNITY): Payer: Self-pay | Admitting: *Deleted

## 2018-04-11 NOTE — Telephone Encounter (Signed)
Received Surg Clearance from [facility], pt needs clearance to have bilateral inguinal hernia repair under general anesthesia and clearance to hold Plavix.  Per Dr Haroldine Laws:  "Ok to hold Plavic, Patient w/at least moderate risk for peri-op CV complications w/general anesthesia.""  Clearance faxed back to [facility] atten: Lindwood Coke, RN at [phone]  Pt is aware this has been done

## 2018-04-18 ENCOUNTER — Ambulatory Visit (HOSPITAL_COMMUNITY)
Admission: RE | Admit: 2018-04-18 | Discharge: 2018-04-18 | Disposition: A | Discharge status: Home / Self Care | Payer: Medicaid Other | Source: Ambulatory Visit | Attending: Internal Medicine | Admitting: Internal Medicine

## 2018-04-18 DIAGNOSIS — I5022 Chronic systolic (congestive) heart failure: Secondary | ICD-10-CM | POA: Insufficient documentation

## 2018-04-18 DIAGNOSIS — I110 Hypertensive heart disease with heart failure: Secondary | ICD-10-CM | POA: Diagnosis not present

## 2018-04-18 DIAGNOSIS — I083 Combined rheumatic disorders of mitral, aortic and tricuspid valves: Secondary | ICD-10-CM | POA: Insufficient documentation

## 2018-04-18 DIAGNOSIS — E785 Hyperlipidemia, unspecified: Secondary | ICD-10-CM | POA: Diagnosis not present

## 2018-04-18 DIAGNOSIS — I272 Pulmonary hypertension, unspecified: Secondary | ICD-10-CM | POA: Diagnosis not present

## 2018-04-18 DIAGNOSIS — I2510 Atherosclerotic heart disease of native coronary artery without angina pectoris: Secondary | ICD-10-CM | POA: Insufficient documentation

## 2018-04-18 NOTE — Progress Notes (Signed)
  Echocardiogram 2D Echocardiogram has been performed.  Jannett Celestine 04/18/2018, 3:56 PM

## 2018-04-20 ENCOUNTER — Other Ambulatory Visit: Payer: Self-pay

## 2018-04-20 ENCOUNTER — Emergency Department (HOSPITAL_COMMUNITY): Payer: Medicaid Other

## 2018-04-20 ENCOUNTER — Encounter (HOSPITAL_COMMUNITY): Payer: Self-pay | Admitting: Emergency Medicine

## 2018-04-20 ENCOUNTER — Emergency Department (HOSPITAL_COMMUNITY)
Admission: EM | Admit: 2018-04-20 | Discharge: 2018-04-21 | Disposition: A | Discharge status: Home / Self Care | Payer: Medicaid Other | Attending: Emergency Medicine | Admitting: Emergency Medicine

## 2018-04-20 DIAGNOSIS — I509 Heart failure, unspecified: Secondary | ICD-10-CM | POA: Insufficient documentation

## 2018-04-20 DIAGNOSIS — I10 Essential (primary) hypertension: Secondary | ICD-10-CM | POA: Diagnosis not present

## 2018-04-20 DIAGNOSIS — Z85850 Personal history of malignant neoplasm of thyroid: Secondary | ICD-10-CM | POA: Insufficient documentation

## 2018-04-20 DIAGNOSIS — Z79899 Other long term (current) drug therapy: Secondary | ICD-10-CM | POA: Insufficient documentation

## 2018-04-20 DIAGNOSIS — Z91040 Latex allergy status: Secondary | ICD-10-CM | POA: Insufficient documentation

## 2018-04-20 DIAGNOSIS — I110 Hypertensive heart disease with heart failure: Secondary | ICD-10-CM | POA: Insufficient documentation

## 2018-04-20 DIAGNOSIS — I2510 Atherosclerotic heart disease of native coronary artery without angina pectoris: Secondary | ICD-10-CM | POA: Diagnosis not present

## 2018-04-20 DIAGNOSIS — R030 Elevated blood-pressure reading, without diagnosis of hypertension: Secondary | ICD-10-CM | POA: Diagnosis not present

## 2018-04-20 DIAGNOSIS — R6889 Other general symptoms and signs: Secondary | ICD-10-CM

## 2018-04-20 LAB — I-STAT TROPONIN, ED: TROPONIN I, POC: 0 ng/mL (ref 0.00–0.08)

## 2018-04-20 NOTE — ED Triage Notes (Signed)
Pt BIB [facility] from home. Pt reports that he thinks that his BP has been high for about 2 days. He denied chest pain with EMS, but reports that he started feeling some when he got here. He states that he took 4 nitro, 650 aspirin, 3 carvedilol, and 1 isosorbide PTA. No unilateral weakness, facial droop, slurred speech or vision changes noted.  18g in L arm. 4mg  zofran en route.

## 2018-04-21 ENCOUNTER — Encounter (HOSPITAL_COMMUNITY): Payer: Self-pay | Admitting: Emergency Medicine

## 2018-04-21 LAB — BASIC METABOLIC PANEL
Anion gap: 10 (ref 5–15)
BUN: 27 mg/dL — ABNORMAL HIGH (ref 6–20)
CHLORIDE: 102 mmol/L (ref 101–111)
CO2: 25 mmol/L (ref 22–32)
Calcium: 8.5 mg/dL — ABNORMAL LOW (ref 8.9–10.3)
Creatinine, Ser: 1.59 mg/dL — ABNORMAL HIGH (ref 0.61–1.24)
GFR, EST AFRICAN AMERICAN: 55 mL/min — AB (ref >60)
GFR, EST NON AFRICAN AMERICAN: 47 mL/min — AB (ref >60)
Glucose, Bld: 108 mg/dL — ABNORMAL HIGH (ref 65–99)
Potassium: 3.2 mmol/L — ABNORMAL LOW (ref 3.5–5.1)
SODIUM: 137 mmol/L (ref 135–145)

## 2018-04-21 LAB — RAPID URINE DRUG SCREEN, HOSP PERFORMED
AMPHETAMINES: NOT DETECTED
Barbiturates: NOT DETECTED
Benzodiazepines: NOT DETECTED
COCAINE: NOT DETECTED
OPIATES: NOT DETECTED
TETRAHYDROCANNABINOL: NOT DETECTED

## 2018-04-21 LAB — CBC
HEMATOCRIT: 32.9 % — AB (ref 39.0–52.0)
HEMOGLOBIN: 10.8 g/dL — AB (ref 13.0–17.0)
MCH: 26.1 pg (ref 26.0–34.0)
MCHC: 32.8 g/dL (ref 30.0–36.0)
MCV: 79.5 fL (ref 78.0–100.0)
Platelets: 165 10*3/uL (ref 150–400)
RBC: 4.14 MIL/uL — AB (ref 4.22–5.81)
RDW: 15.5 % (ref 11.5–15.5)
WBC: 3.4 10*3/uL — ABNORMAL LOW (ref 4.0–10.5)

## 2018-04-21 LAB — I-STAT TROPONIN, ED: TROPONIN I, POC: 0.01 ng/mL (ref 0.00–0.08)

## 2018-04-21 MED ORDER — PREDNISONE 20 MG PO TABS
20.0000 mg | ORAL_TABLET | Freq: Once | ORAL | Status: AC
  Administered 2018-04-21: 20 mg via ORAL
  Filled 2018-04-21: qty 1

## 2018-04-21 MED ORDER — ONDANSETRON HCL 4 MG/2ML IJ SOLN
4.0000 mg | Freq: Once | INTRAMUSCULAR | Status: AC
  Administered 2018-04-21: 4 mg via INTRAVENOUS
  Filled 2018-04-21: qty 2

## 2018-04-21 MED ORDER — POTASSIUM CHLORIDE CRYS ER 20 MEQ PO TBCR
40.0000 meq | EXTENDED_RELEASE_TABLET | Freq: Once | ORAL | Status: AC
  Administered 2018-04-21: 40 meq via ORAL
  Filled 2018-04-21: qty 2

## 2018-04-21 MED ORDER — METHOCARBAMOL 500 MG PO TABS
1000.0000 mg | ORAL_TABLET | Freq: Once | ORAL | Status: AC
  Administered 2018-04-21: 1000 mg via ORAL
  Filled 2018-04-21: qty 2

## 2018-04-21 MED ORDER — HYDROCODONE-ACETAMINOPHEN 5-325 MG PO TABS
1.0000 | ORAL_TABLET | Freq: Once | ORAL | Status: AC
  Administered 2018-04-21: 1 via ORAL
  Filled 2018-04-21: qty 1

## 2018-04-21 NOTE — ED Notes (Signed)
Pt asked me to check his blood pressure that it was high and blood pressure 117/86 pt states that is high, told pt he was discharged.

## 2018-04-21 NOTE — ED Notes (Signed)
Talked with Dr Randal Buba about pt wanting to speak with her.

## 2018-04-21 NOTE — ED Notes (Signed)
No vomiting noted when drinking sprite states still has some nausea voiced alert and oriented x 3 no respiratory or acute distress noted no reaction to medication noted.

## 2018-04-21 NOTE — ED Notes (Signed)
Attempted to discharge patient states he is still in pain from his sciatic nerve and wants to speak with Dr. Randal Buba.

## 2018-04-21 NOTE — ED Notes (Addendum)
Dr Randal Buba went in to update pt about wait and apologized about pt wait. States she talked with cardiologist about lab work.

## 2018-04-21 NOTE — ED Notes (Signed)
No respiratory or acute distress noted alert and oriented x 3 call light in reach. 

## 2018-04-21 NOTE — ED Provider Notes (Signed)
[facility] DEPT Provider Note   CSN: [id number] Arrival date & time: 04/20/18  2303     History   Chief Complaint No chief complaint on file.   HPI Chad [name]. is a 57 y.o. male.  The history is provided by the patient.  Hypertension  This is a chronic problem. The current episode started more than 1 week ago (years). The problem occurs constantly. The problem has been gradually worsening. Pertinent negatives include no chest pain, no abdominal pain, no headaches and no shortness of breath. Nothing aggravates the symptoms. Nothing relieves the symptoms. Treatments tried: additional doses of his home BP medication.  Instead of taking it  The treatment provided no relief.  Patient states he has been having out of control Blood pressure since being seen earlier in the week by Dr. Jeffie Pollock.  He states his BP meds were not adjusted at that time because the BP was fine but his CHF meds were increased.  He stated he has been taking his BP medication 4 times a day instead of 2 times a day since that office visit and reports BP is still high.  He denies DOE, SOB, CP,n/v/d.  No weakness or numbness no changes in vision speech or cognition. No HA.    Denies neck, arm or shoulder pain.  No back pain.  He states he is angry he is here because he wanted EMS to bring him to [facility] and he was brought here.  EDP apologized that patient had been brought to [facility].    Past Medical History:  Diagnosis Date  . AICD (automatic cardioverter/defibrillator) present   . Anemia   . Anginal pain ([place])   . Aortic dissection, thoracoabdominal ([place])    7/10: Type I s/p repair  . Asthma   . CAD (coronary artery disease)    a. s/p CABG 2006;  b. DES to PDA 2011 (cath: Dx not seen, dRCA/PDA tx with DES; S-PDA occluded (culprit), S-Dx occluded, S-RI and OM ok, L-LAD ok  . Carotid stenosis    dopplers 2011: 0-39% bilat.  . Chest pain syndrome   . CHF (congestive heart failure) ([place])    . Chronic bronchitis ([place])   . Chronic lower back pain   . Chronic systolic heart failure (HCC)    a. 12/13 ECHO: EF 35-40%, sept, apical & posterobasal HK, LV mod dil & sys fx mod reduced, mild AI, MV mild reg, TV mild reg  . Complication of anesthesia    "difficult to wake afterwards a couple of times"  . 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 Dyspnea   . Family history of adverse reaction to anesthesia    "sister hard to wake up"  . GERD (gastroesophageal reflux disease)   . Gout   . Heart murmur   . HLD (hyperlipidemia)   . HTN (hypertension)    severe  . Myocardial infarction [facility])    "many" (11/07/2016)  . PONV (postoperative nausea and vomiting)   . Sleep apnea    "never RX'd mask" (11/07/2016)  . Thyroid cancer ([place])    [employer]    Patient Active Problem List   Diagnosis Date Noted  . Inguinal hernia 02/18/2018  . Prediabetes 12/20/2017  . Renal insufficiency   . Hypokalemia   . CHF exacerbation ([place]) 12/15/2017  . Failure of implantable cardioverter-defibrillator (ICD) lead 11/07/2016  . Dyspnea 05/12/2016  . Erectile dysfunction 04/18/2016  .  Pain in the chest   . Aneurysm of descending thoracic aorta ([place])   . Nasal folliculitis 64/40/3474  . Chronic systolic CHF (congestive heart failure) ([place]) 04/12/2015  . CKD (chronic kidney disease) stage 3, GFR 30-59 ml/min (HCC) 04/12/2015  . Unstable angina ([place]) 01/25/2015  . Health care maintenance 03/19/2014  . Local reaction to tetanus vaccine 03/19/2014  . Neck pain on left side 06/03/2013  . Penile discharge 02/26/2013  . Syncope 01/17/2013  . Acute on chronic systolic and diastolic heart failure, NYHA class 3 ([place]) 01/16/2013  . Low back pain 04/23/2012  . Chest pain syndrome   . Insomnia 09/14/2011  . 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 Coronary atherosclerosis of native coronary artery 06/12/2011  .  Bradycardia 05/03/2011  . Aortic dissection, thoracoabdominal ([place])   . Chronic kidney disease (CKD), stage II (mild)   . ORTHOSTATIC HYPOTENSION 05/03/2010  . CAROTID BRUIT 01/25/2010  . PANCYTOPENIA 11/01/2009  . Fatigue 10/25/2009  . DISSECTING AORTIC ANEURYSM THORACOABDOMINAL 07/14/2009  . SCIATICA, RIGHT 05/18/2009  . HYPERLIPIDEMIA-MIXED 09/29/2008  . HYPERTENSION, BENIGN 09/29/2008  . CAD, ARTERY BYPASS GRAFT 09/29/2008  . SYSTOLIC HEART FAILURE, CHRONIC 09/29/2008  . ICD - IN SITU 09/29/2008    Past Surgical History:  Procedure Laterality Date  . BACK SURGERY    . CARDIAC CATHETERIZATION     "several" (11/07/2016)  . CARDIAC DEFIBRILLATOR PLACEMENT  11/2005   [employer]; Archie Endo 05/09/2011  . CORONARY ANGIOPLASTY WITH STENT PLACEMENT     "I've had 1-2 stents" (11/07/2016)  . CORONARY ARTERY BYPASS GRAFT  06/21/2009   "CABG X2"  . CORONARY ARTERY BYPASS GRAFT  06/29/2005   "CABG X7"  . ICD LEAD REMOVAL  11/07/2016  . ICD LEAD REMOVAL N/A 11/07/2016   Procedure: ICD LEAD REMOVAL, INSERTION OF NEW ICD LEAD;  Surgeon: Evans Lance, MD;  Location: [facility];  Service: Cardiovascular;  Laterality: N/A;  Dr. Prescott Gum to backup case  . IMPLANTABLE CARDIOVERTER DEFIBRILLATOR (ICD) GENERATOR CHANGE N/A 12/13/2012   Procedure: ICD GENERATOR CHANGE;  Surgeon: Evans Lance, MD;  Location: [facility] CATH LAB;  Service: Cardiovascular;  Laterality: N/A;  . LUMBAR [place] SURGERY  01/2001    most recent within 5-10 years  . RIGHT HEART CATH N/A 04/05/2018   Procedure: RIGHT HEART CATH;  Surgeon: Jolaine Artist, MD;  Location: [facility] CV LAB;  Service: Cardiovascular;  Laterality: N/A;  . SHOULDER ARTHROSCOPY WITH ROTATOR CUFF REPAIR Bilateral   . Status post emergency repair of a type A ascending aortic dissection with a hemiarch reconstruction of the ascending aorta  using a 28-mm Hemashield graft with redo sternotomy and revision of previous bypass grafts in June 2010.    .  TESTICLE SURGERY    . THYROIDECTOMY, PARTIAL  06/20/2011  . VASECTOMY          Home Medications    Prior to Admission medications   Medication Sig Start Date End Date Taking? Authorizing Provider  albuterol (PROVENTIL HFA;VENTOLIN HFA) 108 (90 Base) MCG/ACT inhaler Inhale 2 puffs into the lungs every 6 (six) hours as needed for wheezing or shortness of breath. 10/19/17  Yes Mikell, Jeani Sow, MD  B Complex-C (SUPER B COMPLEX PO) Take 2 tablets by mouth daily.   Yes [provider]  carvedilol (COREG) 25 MG tablet TAKE ONE TABLET BY MOUTH TWICE DAILY WITH A MEAL Patient taking differently: TAKE 25 MG BY MOUTH TWICE DAILY WITH A MEAL 11/09/17  Yes Bensimhon,  Shaune Pascal, MD  clopidogrel (PLAVIX) 75 MG tablet TAKE ONE TABLET BY MOUTH DAILY Patient taking differently: TAKE 75 MG BY MOUTH DAILY 08/01/17  Yes Bensimhon, Shaune Pascal, MD  cyclobenzaprine (FLEXERIL) 10 MG tablet Take 10 mg by mouth daily as needed for muscle spasms.    Yes [provider]  diphenhydrAMINE (BENADRYL) 25 MG tablet Take 25 mg by mouth every 6 (six) hours as needed for allergies.   Yes [provider]  fluticasone (FLONASE) 50 MCG/ACT nasal spray Place 2 sprays into both nostrils daily as needed for allergies or rhinitis.   Yes [provider]  furosemide (LASIX) 80 MG tablet Take 1 tablet (80 mg total) by mouth 2 (two) times daily. 03/28/18  Yes Bensimhon, Shaune Pascal, MD  hydrALAZINE (APRESOLINE) 50 MG tablet Take 1.5 tablets (75 mg total) by mouth 2 (two) times daily. Needs appt w/Dr. Lovena Le for more refills.  1st ATMPT 04/09/18  Yes Evans Lance, MD  ibuprofen (ADVIL,MOTRIN) 200 MG tablet Take 800 mg by mouth every 6 (six) hours as needed for moderate pain.    Yes [provider]  ipratropium-albuterol (DUONEB) 0.5-2.5 (3) MG/3ML SOLN Take 3 mLs by nebulization every 6 (six) hours as needed. Patient taking differently: Take 3 mLs by nebulization every 6 (six) hours as needed (for  shortness of breath or wheezing).  12/20/17  Yes Lovenia Kim, MD  isosorbide mononitrate (IMDUR) 60 MG 24 hr tablet TAKE ONE TABLET BY MOUTH TWICE DAILY Patient taking differently: TAKE 60 MG BY MOUTH TWICE DAILY 12/10/17  Yes Bensimhon, Shaune Pascal, MD  naphazoline (NAPHCON) 0.1 % ophthalmic solution Place 1 drop into both eyes 4 (four) times daily as needed for irritation.   Yes [provider]  nitroGLYCERIN (NITROSTAT) 0.4 MG SL tablet PLACE ONE TABLET UNDER THE TONGUE EVERY 5 MINUTES FOR 3 DOSES AS NEEDED FOR CHEST PAIN Patient taking differently: PLACE 0.4 MG UNDER THE TONGUE EVERY 5 MINUTES FOR 3 DOSES AS NEEDED FOR CHEST PAIN 07/02/17  Yes Bensimhon, Shaune Pascal, MD  Oxycodone HCl 10 MG TABS Take 20 mg by mouth every 8 (eight) hours.    Yes [provider]  potassium chloride (KLOR-CON M10) 10 MEQ tablet Take 1 tablet (10 mEq total) by mouth daily. Patient taking differently: Take 10 mEq by mouth daily as needed (for cramps).  12/20/17  Yes Lovenia Kim, MD  sacubitril-valsartan (ENTRESTO) 97-103 MG Take 1 tablet by mouth 2 (two) times daily.   Yes [provider]  sildenafil (VIAGRA) 100 MG tablet TAKE 1/2 TABLET BY MOUTH DAILY AS NEEDED FOR ERECTILE DYSFUNCTION Patient taking differently: TAKE 50 MG BY MOUTH DAILY AS NEEDED FOR ERECTILE DYSFUNCTION 02/19/18  Yes Bensimhon, Shaune Pascal, MD  Spacer/Aero-Holding Chambers (AEROCHAMBER PLUS) inhaler Use as instructed 05/12/16  Yes Olam Idler, MD  spironolactone (ALDACTONE) 25 MG tablet Take 0.5 tablets (12.5 mg total) by mouth daily. Patient taking differently: Take 12.5 mg by mouth at bedtime.  01/31/17  Yes Bensimhon, Shaune Pascal, MD  HYDROcodone-acetaminophen (NORCO/VICODIN) 5-325 MG tablet Take 1 tablet by mouth every 6 (six) hours as needed for severe pain. Patient not taking: Reported on 04/21/2018 04/07/18   Carmin Muskrat, MD  predniSONE (DELTASONE) 20 MG tablet Take 2 tablets (40 mg total) by mouth daily with  breakfast. For the next four days Patient not taking: Reported on 04/21/2018 04/07/18   Carmin Muskrat, MD    Family History Family History  Problem Relation Age of Onset  . Hypertension Father   .  Heart disease Father   . Early death Father   . COPD Father   . Hypertension Mother   . Coronary artery disease Unknown     Social History Social History   Tobacco Use  . Smoking status: Never Smoker  . Smokeless tobacco: Never Used  Substance Use Topics  . Alcohol use: No  . Drug use: No     Allergies   Iohexol; Lipitor [atorvastatin calcium]; Shellfish allergy; Sulfa antibiotics; Sulfonamide derivatives; Metrizamide; Latex; and Zocor [simvastatin]   Review of Systems Review of Systems  Constitutional: Negative for diaphoresis and fever.  Respiratory: Negative for choking and shortness of breath.   Cardiovascular: Negative for chest pain, palpitations and leg swelling.  Gastrointestinal: Negative for abdominal pain, nausea and vomiting.  Genitourinary: Negative for dysuria.  Musculoskeletal: Negative for back pain, neck pain and neck stiffness.  Neurological: Negative for dizziness, tremors, seizures, syncope, facial asymmetry, speech difficulty, weakness, light-headedness, numbness and headaches.  All other systems reviewed and are negative.    Physical Exam Updated Vital Signs BP 110/77   Pulse 64   Temp 99.8 F (37.7 C) (Oral)   Resp 17   SpO2 98%   Physical Exam  Constitutional: He is oriented to person, place, and time. He appears well-developed and well-nourished.  HENT:  Head: Normocephalic and atraumatic.  Nose: Nose normal.  Mouth/Throat: No oropharyngeal exudate.  Eyes: Pupils are equal, round, and reactive to light. Conjunctivae are normal.  Neck: Normal range of motion. Neck supple. No JVD present.  Cardiovascular: Normal rate, regular rhythm, normal heart sounds and intact distal pulses.  Pulmonary/Chest: Effort normal. No stridor. No respiratory  distress. He has no wheezes. He has no rales.  Abdominal: Soft. Bowel sounds are normal. He exhibits no mass. There is no tenderness. There is no rebound and no guarding.  Musculoskeletal: Normal range of motion. He exhibits no edema or tenderness.  Neurological: He is alert and oriented to person, place, and time. He displays normal reflexes.  Skin: Skin is warm and dry. Capillary refill takes less than 2 seconds.  Psychiatric: Thought content normal.  Nursing note and vitals reviewed.    ED Treatments / Results  Labs (all labs ordered are listed, but only abnormal results are displayed) Results for orders placed or performed during the hospital encounter of 29/51/88  Basic metabolic panel  Result Value Ref Range   Sodium 137 135 - 145 mmol/L   Potassium 3.2 (L) 3.5 - 5.1 mmol/L   Chloride 102 101 - 111 mmol/L   CO2 25 22 - 32 mmol/L   Glucose, Bld 108 (H) 65 - 99 mg/dL   BUN 27 (H) 6 - 20 mg/dL   Creatinine, Ser 1.59 (H) 0.61 - 1.24 mg/dL   Calcium 8.5 (L) 8.9 - 10.3 mg/dL   GFR calc non Af Amer 47 (L) >60 mL/min   GFR calc Af Amer 55 (L) >60 mL/min   Anion gap 10 5 - 15  CBC  Result Value Ref Range   WBC 3.4 (L) 4.0 - 10.5 K/uL   RBC 4.14 (L) 4.22 - 5.81 MIL/uL   Hemoglobin 10.8 (L) 13.0 - 17.0 g/dL   HCT 32.9 (L) 39.0 - 52.0 %   MCV 79.5 78.0 - 100.0 fL   MCH 26.1 26.0 - 34.0 pg   MCHC 32.8 30.0 - 36.0 g/dL   RDW 15.5 11.5 - 15.5 %   Platelets 165 150 - 400 K/uL  Rapid urine drug screen (hospital performed)  Result Value  Ref Range   Opiates NONE DETECTED NONE DETECTED   Cocaine NONE DETECTED NONE DETECTED   Benzodiazepines NONE DETECTED NONE DETECTED   Amphetamines NONE DETECTED NONE DETECTED   Tetrahydrocannabinol NONE DETECTED NONE DETECTED   Barbiturates NONE DETECTED NONE DETECTED  I-stat troponin, ED  Result Value Ref Range   Troponin i, poc 0.00 0.00 - 0.08 ng/mL   Comment 3          I-stat troponin, ED  Result Value Ref Range   Troponin i, poc 0.01 0.00  - 0.08 ng/mL   Comment 3           Dg Chest 2 View  Result Date: 04/20/2018 CLINICAL DATA:  Chest pain and hypertension. EXAM: CHEST - 2 VIEW COMPARISON:  01/28/2018 FINDINGS: Sternotomy wires and left-sided pacemaker are unchanged. Lungs are adequately inflated without focal airspace consolidation or effusion. Stable cardiomegaly. Stable prominence of the aortic arch. Remainder of the exam is unchanged. IMPRESSION: No acute cardiopulmonary disease. Stable cardiomegaly and stable prominence of the aortic arch. Electronically Signed   By: Marin Olp M.D.   On: 04/20/2018 23:59    EKG EKG Interpretation  Date/Time:  Saturday Shawnese Magner 27 2019 23:16:15 EDT Ventricular Rate:  70 PR Interval:    QRS Duration: 123 QT Interval:  431 QTC Calculation: 466 R Axis:   -43 Text Interpretation:  Sinus rhythm Ventricular premature complex Left atrial enlargement Nonspecific IVCD with LAD LVH with secondary repolarization abnormality Confirmed by Randal Buba, Evalina Tabak ([id number]) on 04/20/2018 11:21:12 PM   Radiology Dg Chest 2 View  Result Date: 04/20/2018 CLINICAL DATA:  Chest pain and hypertension. EXAM: CHEST - 2 VIEW COMPARISON:  01/28/2018 FINDINGS: Sternotomy wires and left-sided pacemaker are unchanged. Lungs are adequately inflated without focal airspace consolidation or effusion. Stable cardiomegaly. Stable prominence of the aortic arch. Remainder of the exam is unchanged. IMPRESSION: No acute cardiopulmonary disease. Stable cardiomegaly and stable prominence of the aortic arch. Electronically Signed   By: Marin Olp M.D.   On: 04/20/2018 23:59    Procedures Procedures (including critical care time)  Medications Ordered in ED Medications  predniSONE (DELTASONE) tablet 20 mg (20 mg Oral Given 04/21/18 0105)  methocarbamol (ROBAXIN) tablet 1,000 mg (1,000 mg Oral Given 04/21/18 0105)  potassium chloride SA (K-DUR,KLOR-CON) CR tablet 40 mEq (40 mEq Oral Given 04/21/18 0124)  HYDROcodone-acetaminophen  (NORCO/VICODIN) 5-325 MG per tablet 1 tablet (1 tablet Oral Given 04/21/18 0216)     1:10 am: case d/w Dr. Soyla Murphy of cardiology.  Patient's history, chief complaint and all labs reviewed via phone.  There is no indication for admission or adjustment of medication at this time.  Please have patient call Dr. Jeffie Pollock on Monday to be seen.    Patient requested medication, unknown name for sciatica.  Med list reviewed and medication ordered  Informed by nurse patient requesting vicodin for his chronic pain.  This was ordered immediately  143 am EDP went to speak with patient regarding test results with nurse Aaron Edelman present.  EDP reassured patient that all labs and Xray tests were negative for acute findings and EDP apolozied again that the patient was brought to [facility] instead of [facility] but EDP had reviewed history and all testing to date with Dr. Mliss Sax of cardiology.  EDP then stated we have ordered a second troponin for 3:30 am and is that test was normal that cardiology stated the patient could be safely discharged to home to follow up with cardiology.    349 second troponin  is negative.  There are no signs of heart failure or MI.   Blood pressure is normal consistently in the ED and the patient is stable for discharge at this time.    Patient PO challenged in the ED.   Final Clinical Impressions(s) / ED Diagnoses   Return for weakness, numbness, changes in vision or speech, fevers >100.4 unrelieved by medication, shortness of breath, intractable vomiting, or diarrhea, abdominal pain, Inability to tolerate liquids or food, cough, altered mental status or any concerns. No signs of systemic illness or infection. The patient is nontoxic-appearing on exam and vital signs are within normal limits.   I have reviewed the triage vital signs and the nursing notes. Pertinent labs &imaging results that were available during my care of the patient were reviewed by me and considered in my medical decision  making (see chart for details).  After history, exam, and medical workup I feel the patient has been appropriately medically screened and is safe for discharge home. Pertinent diagnoses were discussed with the patient. Patient was given return precautions.   Zyrion Coey, MD 04/21/18 [phone]

## 2018-04-21 NOTE — ED Notes (Signed)
No respiratory or acute distress noted alert and oriented x 3 clear speech noted moves all extremities call light in reach.

## 2018-04-24 DIAGNOSIS — G4733 Obstructive sleep apnea (adult) (pediatric): Secondary | ICD-10-CM | POA: Diagnosis not present

## 2018-04-24 DIAGNOSIS — I10 Essential (primary) hypertension: Secondary | ICD-10-CM | POA: Diagnosis not present

## 2018-04-24 DIAGNOSIS — M109 Gout, unspecified: Secondary | ICD-10-CM | POA: Diagnosis not present

## 2018-04-24 DIAGNOSIS — D649 Anemia, unspecified: Secondary | ICD-10-CM | POA: Diagnosis not present

## 2018-04-24 DIAGNOSIS — I259 Chronic ischemic heart disease, unspecified: Secondary | ICD-10-CM | POA: Diagnosis not present

## 2018-04-28 ENCOUNTER — Other Ambulatory Visit: Payer: Self-pay | Admitting: Internal Medicine

## 2018-04-29 ENCOUNTER — Telehealth (HOSPITAL_COMMUNITY): Payer: Self-pay | Admitting: *Deleted

## 2018-04-29 ENCOUNTER — Encounter (HOSPITAL_COMMUNITY): Payer: Self-pay | Admitting: *Deleted

## 2018-04-29 ENCOUNTER — Other Ambulatory Visit: Payer: Self-pay

## 2018-04-29 ENCOUNTER — Observation Stay (HOSPITAL_COMMUNITY)
Admission: EM | Admit: 2018-04-29 | Discharge: 2018-05-01 | Disposition: A | Discharge status: Home / Self Care | Payer: Medicaid Other | Attending: Internal Medicine | Admitting: Internal Medicine

## 2018-04-29 DIAGNOSIS — I7123 Aneurysm of the descending thoracic aorta, without rupture: Secondary | ICD-10-CM | POA: Diagnosis present

## 2018-04-29 DIAGNOSIS — Z8249 Family history of ischemic heart disease and other diseases of the circulatory system: Secondary | ICD-10-CM | POA: Diagnosis not present

## 2018-04-29 DIAGNOSIS — I712 Thoracic aortic aneurysm, without rupture: Secondary | ICD-10-CM | POA: Diagnosis not present

## 2018-04-29 DIAGNOSIS — N183 Chronic kidney disease, stage 3 unspecified: Secondary | ICD-10-CM | POA: Diagnosis present

## 2018-04-29 DIAGNOSIS — Z85850 Personal history of malignant neoplasm of thyroid: Secondary | ICD-10-CM | POA: Diagnosis not present

## 2018-04-29 DIAGNOSIS — F4310 Post-traumatic stress disorder, unspecified: Secondary | ICD-10-CM | POA: Diagnosis not present

## 2018-04-29 DIAGNOSIS — Z7982 Long term (current) use of aspirin: Secondary | ICD-10-CM | POA: Insufficient documentation

## 2018-04-29 DIAGNOSIS — I25810 Atherosclerosis of coronary artery bypass graft(s) without angina pectoris: Secondary | ICD-10-CM | POA: Insufficient documentation

## 2018-04-29 DIAGNOSIS — I5042 Chronic combined systolic (congestive) and diastolic (congestive) heart failure: Secondary | ICD-10-CM | POA: Diagnosis present

## 2018-04-29 DIAGNOSIS — G8929 Other chronic pain: Secondary | ICD-10-CM | POA: Insufficient documentation

## 2018-04-29 DIAGNOSIS — M109 Gout, unspecified: Secondary | ICD-10-CM | POA: Insufficient documentation

## 2018-04-29 DIAGNOSIS — Z91040 Latex allergy status: Secondary | ICD-10-CM | POA: Insufficient documentation

## 2018-04-29 DIAGNOSIS — G4730 Sleep apnea, unspecified: Secondary | ICD-10-CM | POA: Diagnosis not present

## 2018-04-29 DIAGNOSIS — I4581 Long QT syndrome: Secondary | ICD-10-CM | POA: Diagnosis not present

## 2018-04-29 DIAGNOSIS — M545 Low back pain: Secondary | ICD-10-CM | POA: Insufficient documentation

## 2018-04-29 DIAGNOSIS — I10 Essential (primary) hypertension: Secondary | ICD-10-CM | POA: Diagnosis present

## 2018-04-29 DIAGNOSIS — N529 Male erectile dysfunction, unspecified: Secondary | ICD-10-CM | POA: Diagnosis not present

## 2018-04-29 DIAGNOSIS — I472 Ventricular tachycardia: Secondary | ICD-10-CM | POA: Diagnosis not present

## 2018-04-29 DIAGNOSIS — E785 Hyperlipidemia, unspecified: Secondary | ICD-10-CM | POA: Insufficient documentation

## 2018-04-29 DIAGNOSIS — I130 Hypertensive heart and chronic kidney disease with heart failure and stage 1 through stage 4 chronic kidney disease, or unspecified chronic kidney disease: Secondary | ICD-10-CM | POA: Diagnosis not present

## 2018-04-29 DIAGNOSIS — Z951 Presence of aortocoronary bypass graft: Secondary | ICD-10-CM | POA: Insufficient documentation

## 2018-04-29 DIAGNOSIS — I6523 Occlusion and stenosis of bilateral carotid arteries: Secondary | ICD-10-CM | POA: Diagnosis not present

## 2018-04-29 DIAGNOSIS — Z888 Allergy status to other drugs, medicaments and biological substances status: Secondary | ICD-10-CM | POA: Insufficient documentation

## 2018-04-29 DIAGNOSIS — R079 Chest pain, unspecified: Secondary | ICD-10-CM

## 2018-04-29 DIAGNOSIS — I252 Old myocardial infarction: Secondary | ICD-10-CM | POA: Diagnosis not present

## 2018-04-29 DIAGNOSIS — Z7902 Long term (current) use of antithrombotics/antiplatelets: Secondary | ICD-10-CM | POA: Insufficient documentation

## 2018-04-29 DIAGNOSIS — Z95810 Presence of automatic (implantable) cardiac defibrillator: Secondary | ICD-10-CM | POA: Insufficient documentation

## 2018-04-29 DIAGNOSIS — K219 Gastro-esophageal reflux disease without esophagitis: Secondary | ICD-10-CM | POA: Insufficient documentation

## 2018-04-29 DIAGNOSIS — Z79899 Other long term (current) drug therapy: Secondary | ICD-10-CM | POA: Insufficient documentation

## 2018-04-29 DIAGNOSIS — N179 Acute kidney failure, unspecified: Secondary | ICD-10-CM | POA: Diagnosis not present

## 2018-04-29 DIAGNOSIS — R011 Cardiac murmur, unspecified: Secondary | ICD-10-CM | POA: Insufficient documentation

## 2018-04-29 DIAGNOSIS — Z91041 Radiographic dye allergy status: Secondary | ICD-10-CM | POA: Insufficient documentation

## 2018-04-29 DIAGNOSIS — R0789 Other chest pain: Secondary | ICD-10-CM | POA: Diagnosis not present

## 2018-04-29 DIAGNOSIS — J45909 Unspecified asthma, uncomplicated: Secondary | ICD-10-CM | POA: Diagnosis not present

## 2018-04-29 DIAGNOSIS — Z882 Allergy status to sulfonamides status: Secondary | ICD-10-CM | POA: Insufficient documentation

## 2018-04-29 DIAGNOSIS — Z91013 Allergy to seafood: Secondary | ICD-10-CM | POA: Insufficient documentation

## 2018-04-29 LAB — CBC WITH DIFFERENTIAL/PLATELET
BASOS ABS: 0 10*3/uL (ref 0.0–0.1)
Basophils Relative: 1 %
EOS ABS: 0.1 10*3/uL (ref 0.0–0.7)
EOS PCT: 2 %
HCT: 36 % — ABNORMAL LOW (ref 39.0–52.0)
Hemoglobin: 11.7 g/dL — ABNORMAL LOW (ref 13.0–17.0)
Lymphocytes Relative: 22 %
Lymphs Abs: 0.8 10*3/uL (ref 0.7–4.0)
MCH: 25.6 pg — AB (ref 26.0–34.0)
MCHC: 32.5 g/dL (ref 30.0–36.0)
MCV: 78.8 fL (ref 78.0–100.0)
Monocytes Absolute: 0.3 10*3/uL (ref 0.1–1.0)
Monocytes Relative: 7 %
Neutro Abs: 2.5 10*3/uL (ref 1.7–7.7)
Neutrophils Relative %: 68 %
PLATELETS: 166 10*3/uL (ref 150–400)
RBC: 4.57 MIL/uL (ref 4.22–5.81)
RDW: 15.6 % — ABNORMAL HIGH (ref 11.5–15.5)
WBC: 3.7 10*3/uL — AB (ref 4.0–10.5)

## 2018-04-29 LAB — BASIC METABOLIC PANEL
Anion gap: 11 (ref 5–15)
BUN: 29 mg/dL — AB (ref 6–20)
CHLORIDE: 100 mmol/L — AB (ref 101–111)
CO2: 27 mmol/L (ref 22–32)
Calcium: 8.8 mg/dL — ABNORMAL LOW (ref 8.9–10.3)
Creatinine, Ser: 1.82 mg/dL — ABNORMAL HIGH (ref 0.61–1.24)
GFR calc Af Amer: 47 mL/min — ABNORMAL LOW (ref >60)
GFR calc non Af Amer: 40 mL/min — ABNORMAL LOW (ref >60)
Glucose, Bld: 102 mg/dL — ABNORMAL HIGH (ref 65–99)
POTASSIUM: 3.9 mmol/L (ref 3.5–5.1)
Sodium: 138 mmol/L (ref 135–145)

## 2018-04-29 LAB — I-STAT TROPONIN, ED
Troponin i, poc: 0 ng/mL (ref 0.00–0.08)
Troponin i, poc: 0 ng/mL (ref 0.00–0.08)

## 2018-04-29 MED ORDER — SODIUM CHLORIDE 0.9 % IV BOLUS: 500.0000 mL | Freq: Once | INTRAVENOUS | Status: DC

## 2018-04-29 NOTE — ED Provider Notes (Signed)
[facility] EMERGENCY DEPARTMENT Provider Note   CSN: [id number] Arrival date & time: 04/29/18  1655     History   Chief Complaint Chief Complaint  Patient presents with  . Weakness    HPI Chad [name]. is a 57 y.o. male  with a history of aortic dissection (type A s/p repair) as well as descending, CAD s/p CABG, chronic systolic heart failure with AICD in place, HTN, hyperlipidemia, MI, and sleep apnea who presents the emergency department today for chest pain, blood pressure concerns, taking multiple rounds of blood pressure medications.  Patient states he woke up at 0600 and took his regular dose of his blood pressure medications including Carvedilol, Isosorbide, Entresto, and Hydralazine. He states that around 07:30 he developed intermittent chest discomfort described more so as being to his back, states it is a pressure. He checked his blood pressure and it was 170/97 therefore he took another dose of all of his blood pressure medications listed above with the exception of his entresto. He states his BP improved to 123/81, however subsequently went back up. He then took another dose of all medications above other than entresto, with additional 5 tablets of Nitroglycerin, and 2-3 doses of 325 mg aspirin. He states he has felt general weak/lightheaded/off balance all day. His chest/back discomfort has continued to occur intermittently, lasting a few minutes, no specific alleviating/aggravating factors or triggers to this. Has also had some nausea which he states is why he took all of the NTG.  States his chest discomfort and nausea are similar to previous MIs, not similar to previous dissection.  Denies change in vision, dyspnea, palpitations, or leg pain/swelling.    Prescriptions as follows:  Carvedilol 25 mg BID Isosorbide 60 mg BID Entresto (Sacubitril 97 mg-valsartan 103mg ) BID Hydralazine 75 mg BID Nitroglycerin SL 0.4 PRN q 5 minutes max 3 doses.     HPI  Past Medical History:  Diagnosis Date  . AICD (automatic cardioverter/defibrillator) present   . Anemia   . Anginal pain ([place])   . Aortic dissection, thoracoabdominal ([place])    7/10: Type I s/p repair  . Asthma   . CAD (coronary artery disease)    a. s/p CABG 2006;  b. DES to PDA 2011 (cath: Dx not seen, dRCA/PDA tx with DES; S-PDA occluded (culprit), S-Dx occluded, S-RI and OM ok, L-LAD ok  . Carotid stenosis    dopplers 2011: 0-39% bilat.  . Chest pain syndrome   . CHF (congestive heart failure) ([place])   . Chronic bronchitis ([place])   . Chronic lower back pain   . Chronic systolic heart failure (HCC)    a. 12/13 ECHO: EF 35-40%, sept, apical & posterobasal HK, LV mod dil & sys fx mod reduced, mild AI, MV mild reg, TV mild reg  . Complication of anesthesia    "difficult to wake afterwards a couple of times"  . 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 Dyspnea   . Family history of adverse reaction to anesthesia    "sister hard to wake up"  . GERD (gastroesophageal reflux disease)   . Gout   . Heart murmur   . HLD (hyperlipidemia)   . HTN (hypertension)    severe  . Myocardial infarction [facility])    "many" (11/07/2016)  . PONV (postoperative nausea and vomiting)   . Sleep apnea    "never RX'd mask" (11/07/2016)  . Thyroid cancer ([place])  [employer]    Patient Active Problem List   Diagnosis Date Noted  . Inguinal hernia 02/18/2018  . Prediabetes 12/20/2017  . Renal insufficiency   . Hypokalemia   . CHF exacerbation ([place]) 12/15/2017  . Failure of implantable cardioverter-defibrillator (ICD) lead 11/07/2016  . Dyspnea 05/12/2016  . Erectile dysfunction 04/18/2016  . Pain in the chest   . Aneurysm of descending thoracic aorta ([place])   . Nasal folliculitis 16/09/9603  . Chronic systolic CHF (congestive heart failure) ([place]) 04/12/2015  . CKD (chronic kidney disease) stage 3, GFR 30-59 ml/min (HCC)  04/12/2015  . Unstable angina ([place]) 01/25/2015  . Health care maintenance 03/19/2014  . Local reaction to tetanus vaccine 03/19/2014  . Neck pain on left side 06/03/2013  . Penile discharge 02/26/2013  . Syncope 01/17/2013  . Acute on chronic systolic and diastolic heart failure, NYHA class 3 ([place]) 01/16/2013  . Low back pain 04/23/2012  . Chest pain syndrome   . Insomnia 09/14/2011  . 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 Coronary atherosclerosis of native coronary artery 06/12/2011  . Bradycardia 05/03/2011  . Aortic dissection, thoracoabdominal ([place])   . Chronic kidney disease (CKD), stage II (mild)   . ORTHOSTATIC HYPOTENSION 05/03/2010  . CAROTID BRUIT 01/25/2010  . PANCYTOPENIA 11/01/2009  . Fatigue 10/25/2009  . DISSECTING AORTIC ANEURYSM THORACOABDOMINAL 07/14/2009  . SCIATICA, RIGHT 05/18/2009  . HYPERLIPIDEMIA-MIXED 09/29/2008  . HYPERTENSION, BENIGN 09/29/2008  . CAD, ARTERY BYPASS GRAFT 09/29/2008  . SYSTOLIC HEART FAILURE, CHRONIC 09/29/2008  . ICD - IN SITU 09/29/2008    Past Surgical History:  Procedure Laterality Date  . BACK SURGERY    . CARDIAC CATHETERIZATION     "several" (11/07/2016)  . CARDIAC DEFIBRILLATOR PLACEMENT  11/2005   [employer]; Archie Endo 05/09/2011  . CORONARY ANGIOPLASTY WITH STENT PLACEMENT     "I've had 1-2 stents" (11/07/2016)  . CORONARY ARTERY BYPASS GRAFT  06/21/2009   "CABG X2"  . CORONARY ARTERY BYPASS GRAFT  06/29/2005   "CABG X7"  . ICD LEAD REMOVAL  11/07/2016  . ICD LEAD REMOVAL N/A 11/07/2016   Procedure: ICD LEAD REMOVAL, INSERTION OF NEW ICD LEAD;  Surgeon: Evans Lance, MD;  Location: [facility];  Service: Cardiovascular;  Laterality: N/A;  Dr. Prescott Gum to backup case  . IMPLANTABLE CARDIOVERTER DEFIBRILLATOR (ICD) GENERATOR CHANGE N/A 12/13/2012   Procedure: ICD GENERATOR CHANGE;  Surgeon: Evans Lance, MD;  Location: [facility] CATH LAB;  Service: Cardiovascular;  Laterality: N/A;  .  LUMBAR [place] SURGERY  01/2001    most recent within 5-10 years  . RIGHT HEART CATH N/A 04/05/2018   Procedure: RIGHT HEART CATH;  Surgeon: Jolaine Artist, MD;  Location: [facility] CV LAB;  Service: Cardiovascular;  Laterality: N/A;  . SHOULDER ARTHROSCOPY WITH ROTATOR CUFF REPAIR Bilateral   . Status post emergency repair of a type A ascending aortic dissection with a hemiarch reconstruction of the ascending aorta  using a 28-mm Hemashield graft with redo sternotomy and revision of previous bypass grafts in June 2010.    . TESTICLE SURGERY    . THYROIDECTOMY, PARTIAL  06/20/2011  . VASECTOMY          Home Medications    Prior to Admission medications   Medication Sig Start Date End Date Taking? Authorizing Provider  albuterol (PROVENTIL HFA;VENTOLIN HFA) 108 (90 Base) MCG/ACT inhaler Inhale 2 puffs into the lungs every 6 (six) hours as needed for wheezing or shortness of breath. 10/19/17  Mikell, Jeani Sow, MD  B Complex-C (SUPER B COMPLEX PO) Take 2 tablets by mouth daily.    [provider]  carvedilol (COREG) 25 MG tablet TAKE ONE TABLET BY MOUTH TWICE DAILY WITH A MEAL Patient taking differently: TAKE 25 MG BY MOUTH TWICE DAILY WITH A MEAL 11/09/17   Bensimhon, Shaune Pascal, MD  clopidogrel (PLAVIX) 75 MG tablet TAKE ONE TABLET BY MOUTH DAILY Patient taking differently: TAKE 75 MG BY MOUTH DAILY 08/01/17   Bensimhon, Shaune Pascal, MD  cyclobenzaprine (FLEXERIL) 10 MG tablet Take 10 mg by mouth daily as needed for muscle spasms.     [provider]  diphenhydrAMINE (BENADRYL) 25 MG tablet Take 25 mg by mouth every 6 (six) hours as needed for allergies.    [provider]  fluticasone (FLONASE) 50 MCG/ACT nasal spray Place 2 sprays into both nostrils daily as needed for allergies or rhinitis.    [provider]  furosemide (LASIX) 80 MG tablet Take 1 tablet (80 mg total) by mouth 2 (two) times daily. 03/28/18   Bensimhon, Shaune Pascal, MD  hydrALAZINE  (APRESOLINE) 50 MG tablet TAKE 1 & 1/2 (ONE & ONE-HALF) TABLETS BY MOUTH TWICE DAILY 04/29/18   Evans Lance, MD  HYDROcodone-acetaminophen (NORCO/VICODIN) 5-325 MG tablet Take 1 tablet by mouth every 6 (six) hours as needed for severe pain. Patient not taking: Reported on 04/21/2018 04/07/18   Carmin Muskrat, MD  ibuprofen (ADVIL,MOTRIN) 200 MG tablet Take 800 mg by mouth every 6 (six) hours as needed for moderate pain.     [provider]  ipratropium-albuterol (DUONEB) 0.5-2.5 (3) MG/3ML SOLN Take 3 mLs by nebulization every 6 (six) hours as needed. Patient taking differently: Take 3 mLs by nebulization every 6 (six) hours as needed (for shortness of breath or wheezing).  12/20/17   Lovenia Kim, MD  isosorbide mononitrate (IMDUR) 60 MG 24 hr tablet TAKE ONE TABLET BY MOUTH TWICE DAILY Patient taking differently: TAKE 60 MG BY MOUTH TWICE DAILY 12/10/17   Bensimhon, Shaune Pascal, MD  naphazoline (NAPHCON) 0.1 % ophthalmic solution Place 1 drop into both eyes 4 (four) times daily as needed for irritation.    [provider]  nitroGLYCERIN (NITROSTAT) 0.4 MG SL tablet PLACE ONE TABLET UNDER THE TONGUE EVERY 5 MINUTES FOR 3 DOSES AS NEEDED FOR CHEST PAIN Patient taking differently: PLACE 0.4 MG UNDER THE TONGUE EVERY 5 MINUTES FOR 3 DOSES AS NEEDED FOR CHEST PAIN 07/02/17   Bensimhon, Shaune Pascal, MD  Oxycodone HCl 10 MG TABS Take 20 mg by mouth every 8 (eight) hours.     [provider]  potassium chloride (KLOR-CON M10) 10 MEQ tablet Take 1 tablet (10 mEq total) by mouth daily. Patient taking differently: Take 10 mEq by mouth daily as needed (for cramps).  12/20/17   Lovenia Kim, MD  predniSONE (DELTASONE) 20 MG tablet Take 2 tablets (40 mg total) by mouth daily with breakfast. For the next four days Patient not taking: Reported on 04/21/2018 04/07/18   Carmin Muskrat, MD  sacubitril-valsartan (ENTRESTO) 97-103 MG Take 1 tablet by mouth 2 (two) times daily.    [provider]  sildenafil (VIAGRA) 100 MG tablet TAKE 1/2 TABLET BY MOUTH DAILY AS NEEDED FOR ERECTILE DYSFUNCTION Patient taking differently: TAKE 50 MG BY MOUTH DAILY AS NEEDED FOR ERECTILE DYSFUNCTION 02/19/18   Bensimhon, Shaune Pascal, MD  Spacer/Aero-Holding Chambers (AEROCHAMBER PLUS) inhaler Use as instructed 05/12/16   Olam Idler, MD  spironolactone (ALDACTONE) 25  MG tablet Take 0.5 tablets (12.5 mg total) by mouth daily. Patient taking differently: Take 12.5 mg by mouth at bedtime.  01/31/17   Bensimhon, Shaune Pascal, MD    Family History Family History  Problem Relation Age of Onset  . Hypertension Father   . Heart disease Father   . Early death Father   . COPD Father   . Hypertension Mother   . Coronary artery disease Unknown     Social History Social History   Tobacco Use  . Smoking status: Never Smoker  . Smokeless tobacco: Never Used  Substance Use Topics  . Alcohol use: No  . Drug use: No     Allergies   Iohexol; Lipitor [atorvastatin calcium]; Shellfish allergy; Sulfa antibiotics; Sulfonamide derivatives; Metrizamide; Latex; and Zocor [simvastatin]   Review of Systems Review of Systems  Constitutional: Negative for fever.  Eyes: Negative for visual disturbance.  Respiratory: Negative for shortness of breath.   Cardiovascular: Positive for chest pain. Negative for leg swelling.  Gastrointestinal: Positive for nausea. Negative for abdominal pain, diarrhea and vomiting.  Neurological: Positive for dizziness (off balance), weakness (generalized) and light-headedness. Negative for numbness.  All other systems reviewed and are negative.    Physical Exam Updated Vital Signs BP 126/80   Pulse 64   Temp 98.6 F (37 C) (Oral)   Resp 20   SpO2 92%   Physical Exam  Constitutional: He appears well-developed and well-nourished.  Non-toxic appearance. No distress.  HENT:  Head: Normocephalic and atraumatic.  Eyes: Conjunctivae are normal. Right eye exhibits no  discharge. Left eye exhibits no discharge.  Cardiovascular: Normal rate and regular rhythm.  Murmur (systolic) heard. Pulses:      Radial pulses are 2+ on the right side, and 2+ on the left side.       Dorsalis pedis pulses are 2+ on the right side, and 2+ on the left side.  Pulmonary/Chest: Effort normal and breath sounds normal. No respiratory distress. He has no wheezes. He has no rhonchi. He has no rales.  Abdominal: Soft. He exhibits no distension. There is no tenderness.  Musculoskeletal:  Back: No midline tenderness palpation.  Patient has some right thoracic paraspinal muscle tenderness to palpation.  No obvious deformity, appreciable swelling, erythema, ecchymosis, or open wounds.  Neurological: He is alert.  Clear speech.  CN III through XII grossly intact.  Patient has 5 out of 5 symmetric grip strength.  He has 5 out of 5 strength with plantar dorsiflexion bilaterally.  Sensation grossly intact bilateral upper and lower extremities.  He has normal finger-to-nose bilaterally.  Skin: Skin is warm and dry. No rash noted.  Psychiatric: He has a normal mood and affect. His behavior is normal.  Nursing note and vitals reviewed.    ED Treatments / Results  Labs Results for orders placed or performed during the hospital encounter of 04/29/18  CBC with Differential  Result Value Ref Range   WBC 3.7 (L) 4.0 - 10.5 K/uL   RBC 4.57 4.22 - 5.81 MIL/uL   Hemoglobin 11.7 (L) 13.0 - 17.0 g/dL   HCT 36.0 (L) 39.0 - 52.0 %   MCV 78.8 78.0 - 100.0 fL   MCH 25.6 (L) 26.0 - 34.0 pg   MCHC 32.5 30.0 - 36.0 g/dL   RDW 15.6 (H) 11.5 - 15.5 %   Platelets 166 150 - 400 K/uL   Neutrophils Relative % 68 %   Neutro Abs 2.5 1.7 - 7.7 K/uL   Lymphocytes Relative 22 %  Lymphs Abs 0.8 0.7 - 4.0 K/uL   Monocytes Relative 7 %   Monocytes Absolute 0.3 0.1 - 1.0 K/uL   Eosinophils Relative 2 %   Eosinophils Absolute 0.1 0.0 - 0.7 K/uL   Basophils Relative 1 %   Basophils Absolute 0.0 0.0 - 0.1 K/uL   Basic metabolic panel  Result Value Ref Range   Sodium 138 135 - 145 mmol/L   Potassium 3.9 3.5 - 5.1 mmol/L   Chloride 100 (L) 101 - 111 mmol/L   CO2 27 22 - 32 mmol/L   Glucose, Bld 102 (H) 65 - 99 mg/dL   BUN 29 (H) 6 - 20 mg/dL   Creatinine, Ser 1.82 (H) 0.61 - 1.24 mg/dL   Calcium 8.8 (L) 8.9 - 10.3 mg/dL   GFR calc non Af Amer 40 (L) >60 mL/min   GFR calc Af Amer 47 (L) >60 mL/min   Anion gap 11 5 - 15  I-Stat Troponin, ED (not at [facility])  Result Value Ref Range   Troponin i, poc 0.00 0.00 - 0.08 ng/mL   Comment 3          I-stat troponin, ED  Result Value Ref Range   Troponin i, poc 0.00 0.00 - 0.08 ng/mL   Comment 3            EKG EKG Interpretation  Date/Time:  Monday Apr 29 2018 18:08:29 EDT Ventricular Rate:  68 PR Interval:  182 QRS Duration: 114 QT Interval:  434 QTC Calculation: 461 R Axis:   17 Text Interpretation:  Normal sinus rhythm Biatrial enlargement ST & T wave abnormality, consider inferolateral ischemia Prolonged QT Abnormal ECG No acute changes Confirmed by Varney Biles [phone]) on 04/29/2018 9:17:29 PM   Radiology No results found.  Procedures Procedures (including critical care time)  Medications Ordered in ED Medications - No data to display  Initial Impression / Assessment and Plan / ED Course  I have reviewed the triage vital signs and the nursing notes.  Pertinent labs & imaging results that were available during my care of the patient were reviewed by me and considered in my medical decision making (see chart for details).   Patient presents with chest/back pain and concerns regarding his blood pressure having taken 2 additional rounds of his blood pressure medication in addition to his normal doses this morning.  Patient is nontoxic-appearing, in no apparent distress, vitals are reassuring throughout his ER stay.  His blood pressure remains 024-097 systolic and 35-32 diastolic.  He still having intermittent chest/back pain and is  generally not feeling well.   Labs reviewed.  Troponin WNL.  Leukopenia 3.7, improved from previous.  Hemoglobin 11.7, improved from previous.  Mild hypochloremia at 100.  Mild hyperglycemia at 102.  Patient's BUN/creatinine increased from 1.59 and 27 to 1.82 and 29 today. Low suspicion for acute dissection- this does not feel similar to previous, he appears hemodynamically stable, he has 2 + symmetric pulses. There is some concern for unstable angina given his continued intermittent chest pain which he reports feels similar to prior MI- however somewhat atypical, his EKG appears without acute change initial troponin WNL. Findings and plan of care discussed with supervising physician Dr. Kathrynn Humble who personally evaluated and examined this patient- will plan for cardiology consultation.   22:09: CONSULT: Discussed with Dr. Bronson Ing with cardiology- recommends if negative troponin x 2 with unchanged EKG admit to hospitalist service for observation.   Delta troponin WNL.   22:25: CONSULT: Discussed case with  hospitalist Dr. Myna Hidalgo who accepts admission.   Final Clinical Impressions(s) / ED Diagnoses   Final diagnoses:  Chest pain, unspecified type    ED Discharge Orders    None       Amaryllis Dyke, PA-C 04/29/18 [place], Ankit, MD 05/01/18 1859

## 2018-04-29 NOTE — ED Provider Notes (Signed)
Patient placed in Quick Look pathway, seen and evaluated   Chief Complaint: weakness  HPI:   PT reports extensive cardiac disease, waiting on heart transplant. Pt states BP was high this morning, states measured it and it was 170/60. Reports associated CP.  States "I didn't want to have a stroke so I took extra medications."States 2 entresto, 6 tab of isosorbite, 3 doses of carvedilol, at least 5 nitroglycerines at two different time." States now feeling "bad" states "trying not to die."  States now feeling dizzy, lightheaded, weak.   ROS: CP, sob, weakness  Physical Exam:   Gen: No distress  Neuro: Awake and Alert  Skin: Warm    Focused Exam: weak appearing. Regular HR and rhythm, murmur present. Lungs clear to ausculation bilaterally. No peripheral edema.    Initiation of care has begun. The patient has been counseled on the process, plan, and necessity for staying for the completion/evaluation, and the remainder of the medical screening examination  Pt took too many of his BP medications through out the day today, now feeling weak, dizzy. Earlier had chest pain. Will need to check labs, keep on monitor, ECG ordered. VS normal at this time.   Vitals:   04/29/18 1758  BP: 124/78  Pulse: 69  Resp: 16  Temp: 98.6 F (37 C)  TempSrc: Oral  SpO2: 98%      Jeannett Senior, PA-C 04/29/18 1810    Noemi Chapel, MD 05/01/18 [phone]

## 2018-04-29 NOTE — ED Notes (Signed)
Pt given turkey sandwich and coke 

## 2018-04-29 NOTE — ED Triage Notes (Signed)
To ED for eval of weakness. States he is a pt of Dr Sung Amabile and will need a heart transplant. States he has been 'taking care of his own bp since I was 21'. This am pt took scheduled bp meds and within an hour his bp 'jumped sky high' so he double, tripled, and quadrupled his bp meds in order to bring it down. Called Dr Missy Sabins and told to come to the ED immediately. PA in to room for eval. Pt is speaking in full complete sentences. States he feels very weak. Skin w/d.

## 2018-04-29 NOTE — ED Notes (Signed)
Pt concerned about taking night time medications, this RN explained that the admitting doctor would be in to see him and would order all his home meds after seeing him. The pt looked at the monitor and his BP read 137/97 and the pt stated "you better find me a vomit bag bc when the bottom number of my blood pressure goes over 90 I start to vomit." Emesis bag given to pt, no vomiting noted

## 2018-04-29 NOTE — Telephone Encounter (Signed)
Patient called stating his blood pressure was high and he could not get his "bottom" number out of the 90's he could not give me an exact bp reading. Patient decided to alter his Carvedilol 25mg  tablet that is prescribed twice daily, his Imdur 60mg  tablet that is prescribed twice daily, and his hydralazine 50 mg tablet that is prescribed 75mg  twice daily. Instead of taking them twice daily he took them 4 times today. Last dose at 2:45 patients blood pressure was still 170/97. Patient felt like he was going to vomit. Per Nira Conn patient needs to go to the emergency room. Pt is aware and agreeable with plan.

## 2018-04-30 ENCOUNTER — Encounter (HOSPITAL_COMMUNITY): Payer: Self-pay | Admitting: Family Medicine

## 2018-04-30 ENCOUNTER — Other Ambulatory Visit: Payer: Self-pay | Admitting: Internal Medicine

## 2018-04-30 ENCOUNTER — Emergency Department (HOSPITAL_COMMUNITY): Payer: Medicaid Other

## 2018-04-30 DIAGNOSIS — I25709 Atherosclerosis of coronary artery bypass graft(s), unspecified, with unspecified angina pectoris: Secondary | ICD-10-CM | POA: Diagnosis not present

## 2018-04-30 DIAGNOSIS — R072 Precordial pain: Secondary | ICD-10-CM

## 2018-04-30 DIAGNOSIS — I712 Thoracic aortic aneurysm, without rupture: Secondary | ICD-10-CM | POA: Diagnosis not present

## 2018-04-30 DIAGNOSIS — R079 Chest pain, unspecified: Secondary | ICD-10-CM

## 2018-04-30 DIAGNOSIS — I169 Hypertensive crisis, unspecified: Secondary | ICD-10-CM | POA: Diagnosis not present

## 2018-04-30 DIAGNOSIS — N183 Chronic kidney disease, stage 3 (moderate): Secondary | ICD-10-CM | POA: Diagnosis not present

## 2018-04-30 DIAGNOSIS — I10 Essential (primary) hypertension: Secondary | ICD-10-CM | POA: Diagnosis not present

## 2018-04-30 DIAGNOSIS — I5042 Chronic combined systolic (congestive) and diastolic (congestive) heart failure: Secondary | ICD-10-CM

## 2018-04-30 LAB — TROPONIN I
Troponin I: <0.03 ng/mL (ref <0.03)
Troponin I: <0.03 ng/mL (ref <0.03)

## 2018-04-30 MED ORDER — HYDRALAZINE HCL 50 MG PO TABS: 50.0000 mg | ORAL_TABLET | Freq: Four times a day (QID) | ORAL | Status: DC | PRN

## 2018-04-30 MED ORDER — HYDRALAZINE HCL 20 MG/ML IJ SOLN: 10.0000 mg | INTRAMUSCULAR | Status: DC | PRN

## 2018-04-30 MED ORDER — SPIRONOLACTONE 12.5 MG HALF TABLET
12.5000 mg | ORAL_TABLET | Freq: Every day | ORAL | Status: DC
  Administered 2018-04-30: 12.5 mg via ORAL
  Filled 2018-04-30 (×2): qty 1

## 2018-04-30 MED ORDER — SACUBITRIL-VALSARTAN 97-103 MG PO TABS
1.0000 | ORAL_TABLET | Freq: Two times a day (BID) | ORAL | Status: DC
  Administered 2018-04-30 – 2018-05-01 (×3): 1 via ORAL
  Filled 2018-04-30 (×6): qty 1

## 2018-04-30 MED ORDER — HEPARIN SODIUM (PORCINE) 5000 UNIT/ML IJ SOLN
5000.0000 [IU] | Freq: Three times a day (TID) | INTRAMUSCULAR | Status: DC
  Administered 2018-04-30 – 2018-05-01 (×4): 5000 [IU] via SUBCUTANEOUS
  Filled 2018-04-30 (×4): qty 1

## 2018-04-30 MED ORDER — CLOPIDOGREL BISULFATE 75 MG PO TABS
75.0000 mg | ORAL_TABLET | Freq: Every day | ORAL | Status: DC
  Administered 2018-04-30 – 2018-05-01 (×2): 75 mg via ORAL
  Filled 2018-04-30 (×2): qty 1

## 2018-04-30 MED ORDER — POLYVINYL ALCOHOL 1.4 % OP SOLN
1.0000 [drp] | Freq: Four times a day (QID) | OPHTHALMIC | Status: DC | PRN
  Filled 2018-04-30: qty 15

## 2018-04-30 MED ORDER — DIPHENHYDRAMINE HCL 25 MG PO CAPS
25.0000 mg | ORAL_CAPSULE | Freq: Four times a day (QID) | ORAL | Status: DC | PRN
  Filled 2018-04-30: qty 1

## 2018-04-30 MED ORDER — ACETAMINOPHEN 325 MG PO TABS
650.0000 mg | ORAL_TABLET | ORAL | Status: DC | PRN
  Filled 2018-04-30: qty 2

## 2018-04-30 MED ORDER — HYDRALAZINE HCL 50 MG PO TABS
75.0000 mg | ORAL_TABLET | Freq: Two times a day (BID) | ORAL | Status: DC
  Administered 2018-04-30 – 2018-05-01 (×4): 75 mg via ORAL
  Filled 2018-04-30 (×2): qty 3
  Filled 2018-04-30 (×2): qty 1

## 2018-04-30 MED ORDER — CARVEDILOL 25 MG PO TABS
25.0000 mg | ORAL_TABLET | Freq: Two times a day (BID) | ORAL | Status: DC
  Administered 2018-04-30 – 2018-05-01 (×4): 25 mg via ORAL
  Filled 2018-04-30 (×4): qty 1

## 2018-04-30 MED ORDER — NITROGLYCERIN 0.4 MG SL SUBL: 0.4000 mg | SUBLINGUAL_TABLET | SUBLINGUAL | Status: DC | PRN

## 2018-04-30 MED ORDER — IPRATROPIUM-ALBUTEROL 0.5-2.5 (3) MG/3ML IN SOLN: 3.0000 mL | Freq: Four times a day (QID) | RESPIRATORY_TRACT | Status: DC | PRN

## 2018-04-30 MED ORDER — CITALOPRAM HYDROBROMIDE 20 MG PO TABS
10.0000 mg | ORAL_TABLET | Freq: Every day | ORAL | Status: DC
  Filled 2018-04-30: qty 1

## 2018-04-30 MED ORDER — POTASSIUM CHLORIDE CRYS ER 10 MEQ PO TBCR
10.0000 meq | EXTENDED_RELEASE_TABLET | Freq: Every day | ORAL | Status: DC
  Administered 2018-04-30 – 2018-05-01 (×2): 10 meq via ORAL
  Filled 2018-04-30 (×2): qty 1

## 2018-04-30 MED ORDER — HYDRALAZINE HCL 50 MG PO TABS
50.0000 mg | ORAL_TABLET | ORAL | Status: DC
  Administered 2018-04-30 – 2018-05-01 (×2): 50 mg via ORAL
  Filled 2018-04-30 (×2): qty 1

## 2018-04-30 MED ORDER — FUROSEMIDE 80 MG PO TABS
80.0000 mg | ORAL_TABLET | Freq: Two times a day (BID) | ORAL | Status: DC
  Administered 2018-04-30 – 2018-05-01 (×4): 80 mg via ORAL
  Filled 2018-04-30 (×3): qty 1
  Filled 2018-04-30: qty 4

## 2018-04-30 MED ORDER — OXYCODONE HCL 5 MG PO TABS
20.0000 mg | ORAL_TABLET | Freq: Three times a day (TID) | ORAL | Status: DC
  Administered 2018-04-30 – 2018-05-01 (×6): 20 mg via ORAL
  Filled 2018-04-30 (×7): qty 4

## 2018-04-30 MED ORDER — DOXAZOSIN MESYLATE 2 MG PO TABS: 1.0000 mg | ORAL_TABLET | Freq: Two times a day (BID) | ORAL | Status: DC | PRN

## 2018-04-30 MED ORDER — ONDANSETRON HCL 4 MG/2ML IJ SOLN: 4.0000 mg | Freq: Four times a day (QID) | INTRAMUSCULAR | Status: DC | PRN

## 2018-04-30 MED ORDER — ISOSORBIDE MONONITRATE ER 60 MG PO TB24
60.0000 mg | ORAL_TABLET | Freq: Two times a day (BID) | ORAL | Status: DC
  Administered 2018-04-30 – 2018-05-01 (×3): 60 mg via ORAL
  Filled 2018-04-30: qty 1
  Filled 2018-04-30: qty 2
  Filled 2018-04-30: qty 1

## 2018-04-30 MED ORDER — CYCLOBENZAPRINE HCL 10 MG PO TABS: 10.0000 mg | ORAL_TABLET | Freq: Every day | ORAL | Status: DC | PRN

## 2018-04-30 MED ORDER — DOXAZOSIN MESYLATE 2 MG PO TABS
1.0000 mg | ORAL_TABLET | Freq: Two times a day (BID) | ORAL | Status: DC | PRN
  Filled 2018-04-30: qty 1

## 2018-04-30 NOTE — ED Notes (Signed)
Pt placed on 2 L O2nc due to O2 stats dropping to low 80's when he sleeps.  Will continue to monitor

## 2018-04-30 NOTE — Consult Note (Addendum)
Advanced Heart Failure Team Consult Note   Primary Physician: Patient, No Pcp Per PCP-Cardiologist:  Glori Bickers, MD  Reason for Consultation: CP/HTN  HPI:    Chad Laurice Record. is seen today for evaluation of CP/HTN at the request of Dr. Rodena Piety.   [name] is a 57 y.o. 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   Pt last seen in CHF clinic 03/25/18 with worsening SOB. NYHA IIIb symptoms. Taken for cath 04/05/18 as below which showed well compensated hemodynamics at rest with mild/mod reduced CO.   Over the past two months, pt has had multiple episodes of HTN, as high as 604 systolic.  He takes his BP religiously, every 2 hours, regardless of symptoms and says if numbers are high he gets very anxious and feels bad.  Two days in particular the past month, he has taken extra coreg (25 mg) to try and get his BP to come down.  On 04/29/18 his BP spiked up into 170-190s. He took a total of TWO extra doses of 25 mg coreg, 60 mg Imdur, and 50 mg hydralazine in an attempt to get his "bottom number" out of the 90s. When he reported this to the HF clinic along with malaise, nausea, and CP, instructed to go to ER. He notes that energy levels have been   Pertinent labs on presentation include Cr 1.82, K 3.9, BUN 29, Trop negative x 3. WBC 3.7, Hgb 11.7. CXR unremarkable. Admitted for family medicine service for further evaluation and treatment. Cardiology consulted with HTN and med adjustment.   He is feeling better currently. His goal BP at home is < 120/80 and he feels "terrible" If either number crosses that line. He feels "sick" if his systolic is 540 or greater, and feels "very SOB" if his diastolic gets above 87 mmHg. He states he has felt like things have gone down hill since his ICD shocked him (09/2016). He does not miss any of his medications, and as above takes  extra as needed to try and get his BP to goal.   Echo 04/18/18 LVEF 20-25%, Grade 2 DD, mild/Mod AI, Mod MR, Severe LAE, RV mod dilate/mild reduced function, mild RAE, Mod TI, PA peak pressure 54 mm Hg.   [place] 04/05/18 RA = 7 RV = 43/11 PA = 46/22 (31) PCW = 15 Fick cardiac output/index = 4.1/2.1 PVR = 3.9 WU Ao sat = 95% PA sat = 53%, 55%  Review of Systems: [y] = yes, [ ]  = no   General: Weight gain [ ] ; Weight loss [ ] ; Anorexia [ ] ; Fatigue [ ] ; Fever [ ] ; Chills [ ] ; Weakness [ ]   Cardiac: Chest pain/pressure [y]; Resting SOB [y]; Exertional SOB [y]; Orthopnea [ ] ; Pedal Edema [ ] ; Palpitations [ ] ; Syncope [ ] ; Presyncope [ ] ; Paroxysmal nocturnal dyspnea[ ]   Pulmonary: Cough [y]; Wheezing[ ] ; Hemoptysis[ ] ; Sputum [ ] ; Snoring [ ]   GI: Vomiting[ ] ; Dysphagia[ ] ; Melena[ ] ; Hematochezia [ ] ; Heartburn[ ] ; Abdominal pain [ ] ; Constipation [ ] ; Diarrhea [ ] ; BRBPR [ ]   GU: Hematuria[ ] ; Dysuria [ ] ; Nocturia[ ]   Vascular: Pain in legs with walking [ ] ; Pain in feet with lying flat [ ] ; Non-healing sores [ ] ; Stroke [ ] ; TIA [ ] ; Slurred speech [ ] ;  Neuro: Headaches[ ] ; Vertigo[ ] ; Seizures[ ] ; Paresthesias[ ] ;Blurred vision [ ] ;  Diplopia [ ] ; Vision changes [ ]   Ortho/Skin: Arthritis [y]; Joint pain [y]; Muscle pain [ ] ; Joint swelling [ ] ; Back Pain [ ] ; Rash [ ]   Psych: Depression[ ] ; Anxiety[y]  Heme: Bleeding problems [ ] ; Clotting disorders [ ] ; Anemia [ ]   Endocrine: Diabetes [ ] ; Thyroid dysfunction[ ]   Home Medications Prior to Admission medications   Medication Sig Start Date End Date Taking? Authorizing Provider  albuterol (PROVENTIL HFA;VENTOLIN HFA) 108 (90 Base) MCG/ACT inhaler Inhale 2 puffs into the lungs every 6 (six) hours as needed for wheezing or shortness of breath. 10/19/17  Yes Mikell, Jeani Sow, MD  aspirin EC 325 MG tablet Take 975 mg by mouth once as needed (for sudden onset of chest pain).   Yes [provider]  B Complex-C (SUPER B COMPLEX PO)  Take 3 tablets by mouth daily.    Yes [provider]  carvedilol (COREG) 25 MG tablet TAKE ONE TABLET BY MOUTH TWICE DAILY WITH A MEAL Patient taking differently: Take 25 mg by mouth two to three times a day 11/09/17  Yes Makoa Satz, Shaune Pascal, MD  clopidogrel (PLAVIX) 75 MG tablet TAKE ONE TABLET BY MOUTH DAILY Patient taking differently: Take 75 mg by mouth once a day 08/01/17  Yes Alexa Blish, Shaune Pascal, MD  cyclobenzaprine (FLEXERIL) 10 MG tablet Take 10 mg by mouth daily as needed for muscle spasms.    Yes [provider]  diphenhydrAMINE (BENADRYL) 25 MG tablet Take 25 mg by mouth every 6 (six) hours as needed for allergies.   Yes [provider]  fluticasone (FLONASE) 50 MCG/ACT nasal spray Place 2 sprays into both nostrils daily.    Yes [provider]  furosemide (LASIX) 80 MG tablet Take 1 tablet (80 mg total) by mouth 2 (two) times daily. 03/28/18  Yes Aniyha Tate, Shaune Pascal, MD  hydrALAZINE (APRESOLINE) 50 MG tablet TAKE 1 & 1/2 (ONE & ONE-HALF) TABLETS BY MOUTH TWICE DAILY Patient taking differently: Take 75 mg by mouth in the morning then 50 mg midday then 75 mg at bedtime 04/29/18  Yes Evans Lance, MD  ibuprofen (ADVIL,MOTRIN) 200 MG tablet Take 800 mg by mouth every 6 (six) hours as needed for moderate pain.    Yes [provider]  ipratropium-albuterol (DUONEB) 0.5-2.5 (3) MG/3ML SOLN Take 3 mLs by nebulization every 6 (six) hours as needed. Patient taking differently: Take 3 mLs by nebulization every 6 (six) hours as needed (for shortness of breath or wheezing).  12/20/17  Yes Lovenia Kim, MD  isosorbide mononitrate (IMDUR) 60 MG 24 hr tablet TAKE ONE TABLET BY MOUTH TWICE DAILY Patient taking differently: Take 60 mg by mouth two to three times a day 12/10/17  Yes Narissa Beaufort, Shaune Pascal, MD  naphazoline (NAPHCON) 0.1 % ophthalmic solution Place 1 drop into both eyes 4 (four) times daily as needed for irritation.   Yes [provider]    nitroGLYCERIN (NITROSTAT) 0.4 MG SL tablet PLACE ONE TABLET UNDER THE TONGUE EVERY 5 MINUTES FOR 3 DOSES AS NEEDED FOR CHEST PAIN Patient taking differently: PLACE 0.4 MG UNDER THE TONGUE EVERY 5 MINUTES FOR 3 DOSES AS NEEDED FOR CHEST PAIN 07/02/17  Yes Jaxsyn Catalfamo, Shaune Pascal, MD  Oxycodone HCl 10 MG TABS Take 20 mg by mouth every 8 (eight) hours.    Yes [provider]  potassium chloride (KLOR-CON M10) 10 MEQ tablet Take 1 tablet (10 mEq total) by mouth daily. Patient taking differently: Take 10-20 mEq by mouth daily as  needed (for body cramps).  12/20/17  Yes Lovenia Kim, MD  sacubitril-valsartan (ENTRESTO) 97-103 MG Take 1 tablet by mouth 2 (two) times daily.   Yes [provider]  spironolactone (ALDACTONE) 25 MG tablet Take 0.5 tablets (12.5 mg total) by mouth daily. Patient taking differently: Take 12.5 mg by mouth at bedtime.  01/31/17  Yes Jacoby Zanni, Shaune Pascal, MD  HYDROcodone-acetaminophen (NORCO/VICODIN) 5-325 MG tablet Take 1 tablet by mouth every 6 (six) hours as needed for severe pain. Patient not taking: Reported on 04/29/2018 04/07/18   Carmin Muskrat, MD  sildenafil (VIAGRA) 100 MG tablet TAKE 1/2 TABLET BY MOUTH DAILY AS NEEDED FOR ERECTILE DYSFUNCTION 02/19/18   Stavros Cail, Shaune Pascal, MD  Spacer/Aero-Holding Chambers (AEROCHAMBER PLUS) inhaler Use as instructed 05/12/16   Olam Idler, MD    Past Medical History: Past Medical History:  Diagnosis Date  . AICD (automatic cardioverter/defibrillator) present   . Anemia   . Anginal pain ([place])   . Aortic dissection, thoracoabdominal ([place])    7/10: Type I s/p repair  . Asthma   . CAD (coronary artery disease)    a. s/p CABG 2006;  b. DES to PDA 2011 (cath: Dx not seen, dRCA/PDA tx with DES; S-PDA occluded (culprit), S-Dx occluded, S-RI and OM ok, L-LAD ok  . Carotid stenosis    dopplers 2011: 0-39% bilat.  . Chest pain syndrome   . CHF (congestive heart failure) ([place])   . Chronic bronchitis ([place])   . Chronic  lower back pain   . Chronic systolic heart failure (HCC)    a. 12/13 ECHO: EF 35-40%, sept, apical & posterobasal HK, LV mod dil & sys fx mod reduced, mild AI, MV mild reg, TV mild reg  . Complication of anesthesia    "difficult to wake afterwards a couple of times"  . 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 Dyspnea   . Family history of adverse reaction to anesthesia    "sister hard to wake up"  . GERD (gastroesophageal reflux disease)   . Gout   . Heart murmur   . HLD (hyperlipidemia)   . HTN (hypertension)    severe  . Myocardial infarction [facility])    "many" (11/07/2016)  . PONV (postoperative nausea and vomiting)   . Sleep apnea    "never RX'd mask" (11/07/2016)  . Thyroid cancer ([place])    Hertle Cell    Past Surgical History: Past Surgical History:  Procedure Laterality Date  . BACK SURGERY    . CARDIAC CATHETERIZATION     "several" (11/07/2016)  . CARDIAC DEFIBRILLATOR PLACEMENT  11/2005   [employer]; Archie Endo 05/09/2011  . CORONARY ANGIOPLASTY WITH STENT PLACEMENT     "I've had 1-2 stents" (11/07/2016)  . CORONARY ARTERY BYPASS GRAFT  06/21/2009   "CABG X2"  . CORONARY ARTERY BYPASS GRAFT  06/29/2005   "CABG X7"  . ICD LEAD REMOVAL  11/07/2016  . ICD LEAD REMOVAL N/A 11/07/2016   Procedure: ICD LEAD REMOVAL, INSERTION OF NEW ICD LEAD;  Surgeon: Evans Lance, MD;  Location: [facility];  Service: Cardiovascular;  Laterality: N/A;  Dr. Prescott Gum to backup case  . IMPLANTABLE CARDIOVERTER DEFIBRILLATOR (ICD) GENERATOR CHANGE N/A 12/13/2012   Procedure: ICD GENERATOR CHANGE;  Surgeon: Evans Lance, MD;  Location[facility] C[facility] LAB;  Service: Cardiovascular;  Laterality: N/A;  . LUMBAR [place] SURGERY  01/2001    most recent within 5-10 years  .  RIGHT HEART CATH N/A 04/05/2018   Procedure: RIGHT HEART CATH;  Surgeon: Jolaine Artist, MD;  Location: [facility] CV LAB;  Service: Cardiovascular;  Laterality: N/A;  .  SHOULDER ARTHROSCOPY WITH ROTATOR CUFF REPAIR Bilateral   . Status post emergency repair of a type A ascending aortic dissection with a hemiarch reconstruction of the ascending aorta  using a 28-mm Hemashield graft with redo sternotomy and revision of previous bypass grafts in June 2010.    . TESTICLE SURGERY    . THYROIDECTOMY, PARTIAL  06/20/2011  . VASECTOMY      Family History: Family History  Problem Relation Age of Onset  . Hypertension Father   . Heart disease Father   . Early death Father   . COPD Father   . Hypertension Mother   . Coronary artery disease Unknown     Social History: Social History   Socioeconomic History  . Marital status: Divorced    Spouse name: Not on file  . Number of children: Not on file  . Years of education: Not on file  . Highest education level: Not on file  Occupational History  . Occupation: disabled  Social Needs  . Financial resource strain: Not on file  . Food insecurity:    Worry: Not on file    Inability: Not on file  . Transportation needs:    Medical: Not on file    Non-medical: Not on file  Tobacco Use  . Smoking status: Never Smoker  . Smokeless tobacco: Never Used  Substance and Sexual Activity  . Alcohol use: No  . Drug use: No  . Sexual activity: Yes  Lifestyle  . Physical activity:    Days per week: Not on file    Minutes per session: Not on file  . Stress: Not on file  Relationships  . Social connections:    Talks on phone: Not on file    Gets together: Not on file    Attends religious service: Not on file    Active member of club or organization: Not on file    Attends meetings of clubs or organizations: Not on file    Relationship status: Not on file  Other Topics Concern  . Not on file  Social History Narrative   Engaged and lives with fiancee and 4 kids.    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     Allergies:  Allergies  Allergen Reactions  . Contrast Media [Iodinated Diagnostic Agents]  Anaphylaxis  . Iohexol Anaphylaxis and Other (See Comments)    PT HAS ANAPHYLAXIS WITH CONTRAST MEDIA!  . Lipitor [Atorvastatin Calcium] Anaphylaxis and Other (See Comments)    Large doses  . Shellfish Allergy Anaphylaxis  . Sulfa Antibiotics Shortness Of Breath and Swelling  . Sulfonamide Derivatives Shortness Of Breath and Swelling  . Metrizamide Swelling  . Latex Rash and Other (See Comments)    With long periods of exposure  . Zocor [Simvastatin] Other (See Comments)    Muscle cramps    Objective:    Vital Signs:   Temp:  [98.6 F (37 C)] 98.6 F (37 C) (05/06 1758) Pulse Rate:  [57-156] 156 (05/07 1042) Resp:  [12-25] 20 (05/07 1042) BP: (92-137)/(61-97) 121/86 (05/07 1054) SpO2:  [89 %-100 %] 89 % (05/07 1042)    Weight change: There were no vitals filed for this visit.  Intake/Output:   Intake/Output Summary (Last 24 hours) at 04/30/2018 1133 Last data filed at 04/30/2018 1053 Gross per 24 hour  Intake -  Output 1200 ml  Net -1200 ml     Physical Exam    General:  Fatigued appearing. No resp difficulty HEENT: normal. Anicteric  Neck: supple. JVP not elevated. Carotids 2+ bilat; no bruits. No lymphadenopathy or thyromegaly appreciated. Cor: PMI laterally displaced. Regular rate & rhythm. 2/6 AS 2/6 MR Lungs: clear no wheeze Abdomen: soft, nontender, nondistended. No hepatosplenomegaly. No bruits or masses. Good bowel sounds. Extremities: no cyanosis, clubbing, rash, edema Neuro: alert & orientedx3, cranial nerves grossly intact. moves all 4 extremities w/o difficulty. Affect pleasant   Telemetry   NSR, personally reviewed.   EKG    NSR 68 bpm, IVCD diffuse ST-T wave abnormalities (unchanged)  personally reviewed  Labs   Basic Metabolic Panel: Recent Labs  Lab 04/29/18 1810  NA 138  K 3.9  CL 100*  CO2 27  GLUCOSE 102*  BUN 29*  CREATININE 1.82*  CALCIUM 8.8*    Liver Function Tests: No results for input(s): AST, ALT, ALKPHOS, BILITOT,  PROT, ALBUMIN in the last 168 hours. No results for input(s): LIPASE, AMYLASE in the last 168 hours. No results for input(s): AMMONIA in the last 168 hours.  CBC: Recent Labs  Lab 04/29/18 1810  WBC 3.7*  NEUTROABS 2.5  HGB 11.7*  HCT 36.0*  MCV 78.8  PLT 166    Cardiac Enzymes: Recent Labs  Lab 04/30/18 0043 04/30/18 0602  TROPONINI <0.03 <0.03    BNP: BNP (last 3 results) Recent Labs    09/19/17 0944 12/15/17 0518 03/25/18 1609  BNP 834.8* 3,500.9* 2,260.3*    ProBNP (last 3 results) No results for input(s): PROBNP in the last 8760 hours.   CBG: No results for input(s): GLUCAP in the last 168 hours.  Coagulation Studies: No results for input(s): LABPROT, INR in the last 72 hours.   Imaging   Dg Chest Port 1 View  Result Date: 04/30/2018 CLINICAL DATA:  Chest pain this evening EXAM: PORTABLE CHEST 1 VIEW COMPARISON:  Chest CT 10/11/2017 and CXR 04/20/2018 FINDINGS: Stable cardiomegaly and aneurysmal dilatation of the proximal descending thoracic aorta unchanged in appearance. Lungs are free of pulmonary consolidations. No overt pulmonary edema given effusion or pneumothorax. Median sternotomy sutures and post CABG change are redemonstrated as well as a left-sided ICD device with lead in the right ventricle. Surgical clips project over the base of the neck on the left and superior mediastinum. IMPRESSION: Stable cardiomegaly with aneurysmal dilatation of the thoracic aorta as before. No significant change. No active pulmonary disease. Electronically Signed   By: Ashley Royalty M.D.   On: 04/30/2018 00:37      Medications:     Current Medications: . carvedilol  25 mg Oral BID WC  . citalopram  10 mg Oral Daily  . clopidogrel  75 mg Oral Daily  . furosemide  80 mg Oral BID  . heparin  5,000 Units Subcutaneous Q8H  . hydrALAZINE  50 mg Oral Q24H  . hydrALAZINE  75 mg Oral BID  . isosorbide mononitrate  60 mg Oral BID  . oxyCODONE  20 mg Oral Q8H  .  potassium chloride  10 mEq Oral Daily  . sacubitril-valsartan  1 tablet Oral BID  . spironolactone  12.5 mg Oral QHS     Infusions:     Patient Profile   Chad Amber Guthridge. is a 57 y.o. 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   Admitted 04/29/18 into 04/30/18 with malaise and CP.   Assessment/Plan   1. Chest pain - ? Due to HTN vs anxiety - Troponins negative x 3.   2. HTN - 170-190s PTA by pt report. Highest recorded BP since arrival is 137/97. - Pt manages VERY aggressively at home. He takes his BP every 2 hours regardless of symptoms. He feels "short of breath immediately" if his diastolic BP is > than "87" mm Hg. Tries to keep his Systolic <614. - OK to take 50 mg of hydralazine for BP > 431 systolic.  - OK to take 1 mg of cardura for BP > 540 systolic.  - Have asked patient to bring his BP cuff to HF clinic next week to check against our cuff.   3. Chronic systolic HF: Ischemic cardiomyopathy.  Echo (2/16) with EF 25%. S/p [employer] ICD. Echo 3/18. EF 20-25% - Volume status looks OK.  - NYHA IIIb at home.  - He has seen w Dr. Prescott Gum. Given previous dissection and aortic root replacement likely not candidate for VAD. - RHC 04/05/18 with well compensated filling pressures with mildly to moderately reduced CO. Will plan CPX as outpatient.  - Continue Entresto 97/103 bid - Continue carvedilol 25 mg twice a day.  - Continue hydralazine 75/50/75 mg (TID dosing) - Continue Imdur 60 bid.  - Continue spironolactone 12.5 mg daily.  - Has been seen previously at [facility] for transplant eval. May need f/u  3. CAD: s/p CABG - Troponins negative despite CP.  - No recent s/s ischemia  -Cardiolite 11/2015 with evidence for mild reversibility involving the anterolateral wall and a portion of the lateral wall. - We have avoided left heart cath due to dissection  and CKD with solitary kidney (lost kidney due to dissection) - Continue Plavix and ASA 81 mg daily.   - Statin intolerance at any dose.  - Continue Zetia. - PCP follows lipids. If LDL remains elevated, will consider referral to Lipid Clinic to consider PCSK-9  4. 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   5. H/o Type I aortic dissection: Follows with CVTS.  - He has seen w Dr. Prescott Gum. Given previous dissection and aortic root replacement likely not candidate for VAD. - No change to current plan.    6. NSVT - None noted on tele.   7. 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   Pt requests monitoring today and home tomorrow.  Will schedule close CHF follow up and plan CPX.   Medication concerns reviewed with patient and pharmacy team. Barriers identified: None at this time.   Length of Stay: 0  Annamaria Helling  04/30/2018, 11:33 AM  Advanced Heart Failure Team Pager [phone] (M-F; 7a - 4p)  Please contact [facility] for night-coverage after hours (4p -7a ) and weekends on [email or web address]  Patient seen and examined with the above-signed Advanced Practice Provider and/or Housestaff. I personally reviewed laboratory data, imaging studies and relevant notes. I independently examined the patient and formulated the important aspects of the plan. I have edited the note to reflect any of my changes or salient points. I have personally discussed the plan with the patient and/or family.  57 y/o male with CAD, HTN, aortic dissection and chronic systolic HF due to severe iCM. EF 25%. Presents with severe HTN and chest pain. He has been struggling  lately with progressive HF symptoms but CPX 4/18 showed preserved functional capacity and recent RHC (04/05/18) looked ok. He has been checking his BP every 2 hours lately and become obsessed with his BP control and seems to drive hs BP up with anxiety. BP  now with improved control and CP resolved. Troponin negative x 3. Suspect emotional stress playing a big component here. No acute HF on exam.   Will watch overnight to ensure BP is stable. Instructed him he can take extra hydralazine (25-50mg ) as needed for increased BP. Can also give him doxazosin 1mg  prn for refractory HTN. Start Celexa. Hopefully home in am. Will repeat CPX as outpatient to assess HF.   Glori Bickers, MD  2:37 PM

## 2018-04-30 NOTE — H&P (Signed)
History and Physical    Chad [name]. MVH:[id number] DOB: July 01, 1961 DOA: 04/29/2018  PCP: Patient, No Pcp Per   Patient coming from: Home   Chief Complaint: Chest pain   HPI: Chad [name]. is a 57 y.o. 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  The patient reports that he has chronic chest pain, but notes that this has been worse over the past day.  He describes it as intermittent, moderate in intensity, worse when his blood pressure is elevated but not necessarily with exertion.  He denies any significant cough and denies fevers or chills.  He reports chronic dyspnea that is largely unchanged.  He noted his blood pressure to be elevated today and took extra doses of his antihypertensives.  He took a full dose aspirin prior to arrival.  ED Course: Upon arrival to the ED, patient is found to be afebrile, saturating well on room air, and with vitals otherwise normal.  EKG features a sinus rhythm with nonspecific ST and T wave abnormalities that are similar to prior.  Chest x-ray is stable with no acute cardiopulmonary disease.  Chemistry panel is notable for creatinine 1.87, up from 1.59 last month.  CBC features and improved leukopenia with WBC 3700.  Troponin is undetectable.  Cardiology was consulted by the ED physician and recommended medical observation.  Review of Systems:  All other systems reviewed and apart from HPI, are negative.  Past Medical History:  Diagnosis Date  . AICD (automatic cardioverter/defibrillator) present   . Anemia   . Anginal pain ([place])   . Aortic dissection, thoracoabdominal ([place])    7/10: Type I s/p repair  . Asthma   . CAD (coronary artery  disease)    a. s/p CABG 2006;  b. DES to PDA 2011 (cath: Dx not seen, dRCA/PDA tx with DES; S-PDA occluded (culprit), S-Dx occluded, S-RI and OM ok, L-LAD ok  . Carotid stenosis    dopplers 2011: 0-39% bilat.  . Chest pain syndrome   . CHF (congestive heart failure) ([place])   . Chronic bronchitis ([place])   . Chronic lower back pain   . Chronic systolic heart failure (H[facility])    a. 12/13 ECHO: EF 35-40%, sept, apical & posterobasal HK, LV mod dil & sys fx mod reduced, mild AI, MV mild reg, TV mild reg  . Complication of anesthesia    "difficult to wake afterwards a couple of times"  . 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 Dyspnea   . Family history of adverse reaction to anesthesia    "sister hard to wake up"  . GERD (gastroesophageal reflux disease)   . Gout   . Heart murmur   . HLD (hyperlipidemia)   . HTN (hypertension)    severe  . Myocardial infarction [facility])    "many" (11/07/2016)  . PONV (postoperative nausea and vomiting)   . Sleep apnea    "never RX'd mask" (11/07/2016)  . Thyroid cancer ([place])    [employer]    Past Surgical History:  Procedure Laterality Date  . BACK SURGERY    . CARDIAC CATHETERIZATION     "several" (11/07/2016)  . CARDIAC DEFIBRILLATOR PLACEMENT  11/2005  [employer]; Archie Endo 05/09/2011  . CORONARY ANGIOPLASTY WITH STENT PLACEMENT     "I've had 1-2 stents" (11/07/2016)  . CORONARY ARTERY BYPASS GRAFT  06/21/2009   "CABG X2"  . CORONARY ARTERY BYPASS GRAFT  06/29/2005   "CABG X7"  . ICD LEAD REMOVAL  11/07/2016  . ICD LEAD REMOVAL N/A 11/07/2016   Procedure: ICD LEAD REMOVAL, INSERTION OF NEW ICD LEAD;  Surgeon: Evans Lance, MD;  Location: [facility];  Service: Cardiovascular;  Laterality: N/A;  Dr. Prescott Gum to backup case  . IMPLANTABLE CARDIOVERTER DEFIBRILLATOR (ICD) GENERATOR CHANGE N/A 12/13/2012   Procedure: ICD GENERATOR CHANGE;  Surgeon: Evans Lance, MD;  Location: [facility] CATH  LAB;  Service: Cardiovascular;  Laterality: N/A;  . LUMBAR [place] SURGERY  01/2001    most recent within 5-10 years  . RIGHT HEART CATH N/A 04/05/2018   Procedure: RIGHT HEART CATH;  Surgeon: Jolaine Artist, MD;  Location: [facility] CV LAB;  Service: Cardiovascular;  Laterality: N/A;  . SHOULDER ARTHROSCOPY WITH ROTATOR CUFF REPAIR Bilateral   . Status post emergency repair of a type A ascending aortic dissection with a hemiarch reconstruction of the ascending aorta  using a 28-mm Hemashield graft with redo sternotomy and revision of previous bypass grafts in June 2010.    . TESTICLE SURGERY    . THYROIDECTOMY, PARTIAL  06/20/2011  . VASECTOMY       reports that he has never smoked. He has never used smokeless tobacco. He reports that he does not drink alcohol or use drugs.  Allergies  Allergen Reactions  . Contrast Media [Iodinated Diagnostic Agents] Anaphylaxis  . Iohexol Anaphylaxis and Other (See Comments)    PT HAS ANAPHYLAXIS WITH CONTRAST MEDIA!  . Lipitor [Atorvastatin Calcium] Anaphylaxis and Other (See Comments)    Large doses  . Shellfish Allergy Anaphylaxis  . Sulfa Antibiotics Shortness Of Breath and Swelling  . Sulfonamide Derivatives Shortness Of Breath and Swelling  . Metrizamide Swelling  . Latex Rash and Other (See Comments)    With long periods of exposure  . Zocor [Simvastatin] Other (See Comments)    Muscle cramps    Family History  Problem Relation Age of Onset  . Hypertension Father   . Heart disease Father   . Early death Father   . COPD Father   . Hypertension Mother   . Coronary artery disease Unknown      Prior to Admission medications   Medication Sig Start Date End Date Taking? Authorizing Provider  albuterol (PROVENTIL HFA;VENTOLIN HFA) 108 (90 Base) MCG/ACT inhaler Inhale 2 puffs into the lungs every 6 (six) hours as needed for wheezing or shortness of breath. 10/19/17  Yes Mikell, Jeani Sow, MD  aspirin EC 325 MG tablet Take 975 mg  by mouth once as needed (for sudden onset of chest pain).   Yes [provider]  B Complex-C (SUPER B COMPLEX PO) Take 3 tablets by mouth daily.    Yes [provider]  carvedilol (COREG) 25 MG tablet TAKE ONE TABLET BY MOUTH TWICE DAILY WITH A MEAL Patient taking differently: Take 25 mg by mouth two to three times a day 11/09/17  Yes Bensimhon, Shaune Pascal, MD  clopidogrel (PLAVIX) 75 MG tablet TAKE ONE TABLET BY MOUTH DAILY Patient taking differently: Take 75 mg by mouth once a day 08/01/17  Yes Bensimhon, Shaune Pascal, MD  cyclobenzaprine (FLEXERIL) 10 MG tablet Take 10 mg by mouth daily as needed for muscle spasms.  Yes [provider]  diphenhydrAMINE (BENADRYL) 25 MG tablet Take 25 mg by mouth every 6 (six) hours as needed for allergies.   Yes [provider]  fluticasone (FLONASE) 50 MCG/ACT nasal spray Place 2 sprays into both nostrils daily.    Yes [provider]  furosemide (LASIX) 80 MG tablet Take 1 tablet (80 mg total) by mouth 2 (two) times daily. 03/28/18  Yes Bensimhon, Shaune Pascal, MD  hydrALAZINE (APRESOLINE) 50 MG tablet TAKE 1 & 1/2 (ONE & ONE-HALF) TABLETS BY MOUTH TWICE DAILY Patient taking differently: Take 75 mg by mouth in the morning then 50 mg midday then 75 mg at bedtime 04/29/18  Yes Evans Lance, MD  ibuprofen (ADVIL,MOTRIN) 200 MG tablet Take 800 mg by mouth every 6 (six) hours as needed for moderate pain.    Yes [provider]  ipratropium-albuterol (DUONEB) 0.5-2.5 (3) MG/3ML SOLN Take 3 mLs by nebulization every 6 (six) hours as needed. Patient taking differently: Take 3 mLs by nebulization every 6 (six) hours as needed (for shortness of breath or wheezing).  12/20/17  Yes Lovenia Kim, MD  isosorbide mononitrate (IMDUR) 60 MG 24 hr tablet TAKE ONE TABLET BY MOUTH TWICE DAILY Patient taking differently: Take 60 mg by mouth two to three times a day 12/10/17  Yes Bensimhon, Shaune Pascal, MD  naphazoline (NAPHCON) 0.1 %  ophthalmic solution Place 1 drop into both eyes 4 (four) times daily as needed for irritation.   Yes [provider]  nitroGLYCERIN (NITROSTAT) 0.4 MG SL tablet PLACE ONE TABLET UNDER THE TONGUE EVERY 5 MINUTES FOR 3 DOSES AS NEEDED FOR CHEST PAIN Patient taking differently: PLACE 0.4 MG UNDER THE TONGUE EVERY 5 MINUTES FOR 3 DOSES AS NEEDED FOR CHEST PAIN 07/02/17  Yes Bensimhon, Shaune Pascal, MD  Oxycodone HCl 10 MG TABS Take 20 mg by mouth every 8 (eight) hours.    Yes [provider]  potassium chloride (KLOR-CON M10) 10 MEQ tablet Take 1 tablet (10 mEq total) by mouth daily. Patient taking differently: Take 10-20 mEq by mouth daily as needed (for body cramps).  12/20/17  Yes Lovenia Kim, MD  sacubitril-valsartan (ENTRESTO) 97-103 MG Take 1 tablet by mouth 2 (two) times daily.   Yes [provider]  spironolactone (ALDACTONE) 25 MG tablet Take 0.5 tablets (12.5 mg total) by mouth daily. Patient taking differently: Take 12.5 mg by mouth at bedtime.  01/31/17  Yes Bensimhon, Shaune Pascal, MD  HYDROcodone-acetaminophen (NORCO/VICODIN) 5-325 MG tablet Take 1 tablet by mouth every 6 (six) hours as needed for severe pain. Patient not taking: Reported on 04/29/2018 04/07/18   Carmin Muskrat, MD  sildenafil (VIAGRA) 100 MG tablet TAKE 1/2 TABLET BY MOUTH DAILY AS NEEDED FOR ERECTILE DYSFUNCTION 02/19/18   Bensimhon, Shaune Pascal, MD  Spacer/Aero-Holding Chambers (AEROCHAMBER PLUS) inhaler Use as instructed 05/12/16   Olam Idler, MD    Physical Exam: Vitals:   04/29/18 2030 04/29/18 2200 04/29/18 2230 04/29/18 2245  BP: 116/78 131/78 (!) 137/97 135/90  Pulse: 61 73 71 70  Resp: 12 16 (!) 25 (!) 22  Temp:      TempSrc:      SpO2: 92% 93% 91% 90%      Constitutional: NAD, calm  Eyes: PERTLA, lids and conjunctivae normal ENMT: Mucous membranes are moist. Posterior pharynx clear of any exudate or lesions.   Neck: normal, supple, no masses, no thyromegaly Respiratory: clear to  auscultation bilaterally, no wheezing, no crackles. Normal respiratory effort.  Cardiovascular: S1 & S2 heard, regular rate and rhythm. No significant JVD. Abdomen: No distension, no tenderness, soft. Bowel sounds normal.  Musculoskeletal: no clubbing / cyanosis. No joint deformity upper and lower extremities.    Skin: no significant rashes, lesions, ulcers. Warm, dry, well-perfused. Neurologic: CN 2-12 grossly intact. Sensation intact. Strength 5/5 in all 4 limbs.  Psychiatric: Alert and oriented x 3. Calm, cooperative.     Labs on Admission: I have personally reviewed following labs and imaging studies  CBC: Recent Labs  Lab 04/29/18 1810  WBC 3.7*  NEUTROABS 2.5  HGB 11.7*  HCT 36.0*  MCV 78.8  PLT 093   Basic Metabolic Panel: Recent Labs  Lab 04/29/18 1810  NA 138  K 3.9  CL 100*  CO2 27  GLUCOSE 102*  BUN 29*  CREATININE 1.82*  CALCIUM 8.8*   GFR: CrCl cannot be calculated (Unknown ideal weight.). Liver Function Tests: No results for input(s): AST, ALT, ALKPHOS, BILITOT, PROT, ALBUMIN in the last 168 hours. No results for input(s): LIPASE, AMYLASE in the last 168 hours. No results for input(s): AMMONIA in the last 168 hours. Coagulation Profile: No results for input(s): INR, PROTIME in the last 168 hours. Cardiac Enzymes: No results for input(s): CKTOTAL, CKMB, CKMBINDEX, TROPONINI in the last 168 hours. BNP (last 3 results) No results for input(s): PROBNP in the last 8760 hours. HbA1C: No results for input(s): HGBA1C in the last 72 hours. CBG: No results for input(s): GLUCAP in the last 168 hours. Lipid Profile: No results for input(s): CHOL, HDL, LDLCALC, TRIG, CHOLHDL, LDLDIRECT in the last 72 hours. Thyroid Function Tests: No results for input(s): TSH, T4TOTAL, FREET4, T3FREE, THYROIDAB in the last 72 hours. Anemia Panel: No results for input(s): VITAMINB12, FOLATE, FERRITIN, TIBC, IRON, RETICCTPCT in the last 72 hours. Urine analysis:      Component Value Date/Time   COLORURINE YELLOW 06/01/2017 1933   APPEARANCEUR CLEAR 06/01/2017 1933   LABSPEC 1.015 06/01/2017 1933   PHURINE 6.0 06/01/2017 1933   GLUCOSEU NEGATIVE 06/01/2017 [place] NEGATIVE 06/01/2017 [place] NEGATIVE 06/01/2017 1933   BILIRUBINUR NEG 02/26/2013 1410   KETONESUR NEGATIVE 06/01/2017 1933   PROTEINUR NEGATIVE 06/01/2017 1933   UROBILINOGEN 0.2 03/21/2015 0045   NITRITE NEGATIVE 06/01/2017 1933   LEUKOCYTESUR NEGATIVE 06/01/2017 1933   Sepsis Labs: @LABRCNTIP (procalcitonin:4,lacticidven:4) )No results found for this or any previous visit (from the past 240 hour(s)).   Radiological Exams on Admission: No results found.  EKG: Independently reviewed. Sinus rhythm with non-specific ST-T abnormalities similar to prior.   Assessment/Plan  1. Chest pain; CAD  - Reports chronic chest pain, but worse for past 24 hours  - EKG does not appear acutely changed, troponin undetectable x2, CXR appears stable with official read pending  - Cardiology was consulted by ED physician and medical admission recommended  - Full-dose aspirin given prior to arrival in ED  - Continue cardiac monitoring, obtain serial troponin measurements, continue beta-blocker, Plavix, nitrates    2. Chronic combined systolic and diastolic CHF  - Appears euvolemic on admission  - Follows with CHF clinic, EF 20-25% in April '19 with grade 2 diastolic dysfunction, inferolateral AK, severe LAE, mild-mod AR, moderate MR and TR  - Continue Lasix, Aldactone, Coreg, Entresto   3. CKD stage III - SCr is 1.87 on admission, up from 1.59 last month  - Has solitary kidney  - Renally-dose medications, avoid nephrotoxins, repeat chemistry panel in am    4. Hx of type  I aortic dissection  - Follows with [facility] and s/p urgent repair in 2010  - Appears stable on CXR   5. Chronic pain  - Stable  - Continue home-regimen with oxycodone    6. Hypertension  - BP at goal in ED, had  been high at home prior to arrival and he took extra doses of his antihypertensives  - Continue hydralazine, Entresto, and diuretics     DVT prophylaxis: sq heparin  Code Status: Full  Family Communication: Discussed with patient Consults called: Cardiology consulted by ED physician Admission status: Observation    Vianne Bulls, MD [facility] Pager [phone]  If 7PM-7AM, please contact night-coverage [email or web address] Password [email or web address]  04/30/2018, 12:20 AM

## 2018-04-30 NOTE — ED Notes (Signed)
Pt wants to wait to take his entresto and keep checking his BP. He is concerned that his BP may drop too low taking both at the same time. Will re-evaluate in 1 hr per pt request to take entresto later.

## 2018-04-30 NOTE — Progress Notes (Signed)
Patient's home medications at bedside in a dark colored male grooming bag. Patient would not allow this nurse or charge nurse remove medications or view contents of medications in grooming bag. Patient encouraged to send all home medication supply with family present at bedside. Patient on personal cell phone to contact family. Patient educated on hospital policy for safety and security of medication. Charge nurse notified of all of the above.

## 2018-04-30 NOTE — Progress Notes (Signed)
Patient arrived to the unit at approximately 1510 via 1 person staff transport via wheel chair. Patient oriented to unit and room. VS obtained. CCMD notified of admission. 2nd verifier in room. Call light in placed and discussed operation with patient. Personal medications in room and at bedside. Patient informed that this nurse would have to transport to [place] for logging in. Patient refused. Patient given the option to call a family member to transport medications home. Patient stated that he would contact a family member to take home for him. Patient given CHG bath via 2 staff member. Patient denied pain at this time. Patient preferred to sit on the side of the bed. Patient has a steady gait and is independent for transfers at this time.

## 2018-04-30 NOTE — Progress Notes (Signed)
Patient family members left room after visiting earlier in shift. Patient refused to send personal home medications with family members. Patient re-educated as to the safety of securing medications with Main pharmacy for his protection and visitors. Patient continues to insist leaving medications within his reach. Patient educated as to not self administering his home medications, but to notify staff of any and all medication needs. Patient stated understanding.

## 2018-04-30 NOTE — ED Notes (Signed)
Report given to 4 E

## 2018-04-30 NOTE — Progress Notes (Signed)
57 year old male admitted early this morning by my partner with chest pain.  Patient has a history of chronic CHF with ICD, history of aortic dissection with urgent repair, CKD stage III admitted with recurrent chest pain for 24 hours prior to admission the hospital.  His cardiac enzymes were cycled x3 and was negative.  Continue Plavix and nitrates Lasix Aldactone and Entresto and Coreg cardiology to see patient today.

## 2018-05-01 DIAGNOSIS — I130 Hypertensive heart and chronic kidney disease with heart failure and stage 1 through stage 4 chronic kidney disease, or unspecified chronic kidney disease: Secondary | ICD-10-CM | POA: Diagnosis not present

## 2018-05-01 DIAGNOSIS — N183 Chronic kidney disease, stage 3 (moderate): Secondary | ICD-10-CM | POA: Diagnosis not present

## 2018-05-01 DIAGNOSIS — I10 Essential (primary) hypertension: Secondary | ICD-10-CM | POA: Diagnosis not present

## 2018-05-01 DIAGNOSIS — I169 Hypertensive crisis, unspecified: Secondary | ICD-10-CM | POA: Diagnosis not present

## 2018-05-01 DIAGNOSIS — R072 Precordial pain: Secondary | ICD-10-CM | POA: Diagnosis not present

## 2018-05-01 DIAGNOSIS — I5042 Chronic combined systolic (congestive) and diastolic (congestive) heart failure: Secondary | ICD-10-CM | POA: Diagnosis not present

## 2018-05-01 DIAGNOSIS — R0789 Other chest pain: Secondary | ICD-10-CM | POA: Diagnosis not present

## 2018-05-01 MED ORDER — HYDRALAZINE HCL 50 MG PO TABS: ORAL_TABLET | ORAL | 6 refills | Status: DC

## 2018-05-01 MED ORDER — ASPIRIN EC 81 MG PO TBEC: 81.0000 mg | DELAYED_RELEASE_TABLET | Freq: Every day | ORAL | 6 refills | Status: DC

## 2018-05-01 MED ORDER — DOXAZOSIN MESYLATE 1 MG PO TABS: 1.0000 mg | ORAL_TABLET | Freq: Two times a day (BID) | ORAL | 3 refills | Status: DC | PRN

## 2018-05-01 NOTE — Progress Notes (Signed)
Patient discharge instructions reviewed with patient to include after discharge care, diet, activity, medications, and follow up appointments. Personal belongings at bedside to include grooming bag with all personal home medications. Patient assisted via 1 person staff for transport to personal vehicle. VS stable. No acute distress.

## 2018-05-01 NOTE — Discharge Summary (Signed)
Physician Discharge Summary  Chad [name]. RSW:[id number] DOB: 05-09-1961 DOA: 04/29/2018  PCP: Patient, No Pcp Per  Admit date: 04/29/2018 Discharge date: 05/01/2018  Admitted From: Home Disposition: Home Recommendations for Outpatient Follow-up:  1. Follow up with PCP in 1-2 weeks 2. Please obtain BMP/CBC in one week  Home Health: None Equipment/Devices: None Discharge Condition stable CODE STATUS full code Diet recommendation: Cardiac Brief/Interim Summary:57 y.o. 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  The patient reports that he has chronic chest pain, but notes that this has been worse over the past day.  He describes it as intermittent, moderate in intensity, worse when his blood pressure is elevated but not necessarily with exertion.  He denies any significant cough and denies fevers or chills.  He reports chronic dyspnea that is largely unchanged.  He noted his blood pressure to be elevated today and took extra doses of his antihypertensives.  He took a full dose aspirin prior to arrival.  ED Course: Upon arrival to the ED, patient is found to be afebrile, saturating well on room air, and with vitals otherwise normal.  EKG features a sinus rhythm with nonspecific ST and T wave abnormalities that are similar to prior.  Chest x-ray is stable with no acute cardiopulmonary disease.  Chemistry panel is notable for creatinine 1.87, up from 1.59 last month.  CBC features and improved leukopenia with WBC 3700.  Troponin is undetectable.  Cardiology was consulted by the ED physician and recommended medical observation.     Discharge Diagnoses:  Principal Problem:   Chest pain Active  Problems:   HYPERTENSION, BENIGN   CAD, ARTERY BYPASS GRAFT   Chronic combined systolic and diastolic CHF (congestive heart failure) (HCC)   CKD (chronic kidney disease) stage 3, GFR 30-59 ml/min (HCC)   Aneurysm of descending thoracic aorta (HCC)  1] atypical chest pain with negative cardiac work-up.  Patient followed by cardiology heart failure team.  They felt the pain was either due to hypertension versus anxiety from aggressively checking his blood pressure every 2 hours at home and if it is minimally high he gets stressed out and gets worked up himself and gets anxious and then his blood pressure goes up even further more which is probably caused his chest pain.  Currently he is free of chest pain.  2] history of chronic systolic heart failure with ejection fraction 25% continue spironolactone, Imdur, hydralazine, carvedilol, and Entresto.  3] history of CKD with solitary kidney close monitoring needed avoid nephrotoxins.    Discharge Instructions   Allergies as of 05/01/2018      Reactions   Contrast Media [iodinated Diagnostic Agents] Anaphylaxis   Iohexol Anaphylaxis, Other (See Comments)   PT HAS ANAPHYLAXIS WITH CONTRAST MEDIA!   Lipitor [atorvastatin Calcium] Anaphylaxis, Other (See Comments)   Large doses   Shellfish Allergy Anaphylaxis   Sulfa Antibiotics Shortness Of Breath, Swelling   Sulfonamide Derivatives Shortness Of Breath, Swelling   Metrizamide Swelling   Latex Rash, Other (See Comments)   With long periods of exposure   Zocor [simvastatin] Other (See Comments)   Muscle cramps      Medication List    STOP taking these medications   AEROCHAMBER PLUS inhaler   diphenhydrAMINE 25  MG tablet Commonly known as:  BENADRYL   ibuprofen 200 MG tablet Commonly known as:  ADVIL,MOTRIN   sildenafil 100 MG tablet Commonly known as:  VIAGRA     TAKE these medications   albuterol 108 (90 Base) MCG/ACT inhaler Commonly known as:  PROVENTIL HFA;VENTOLIN  HFA Inhale 2 puffs into the lungs every 6 (six) hours as needed for wheezing or shortness of breath.   aspirin EC 81 MG tablet Take 1 tablet (81 mg total) by mouth daily. Can take 4 tabs (324 mg total) as needed for chest pain. DO NOT TAKE multiple 325 mg tabs. What changed:    medication strength  how much to take  when to take this  reasons to take this  additional instructions   carvedilol 25 MG tablet Commonly known as:  COREG TAKE ONE TABLET BY MOUTH TWICE DAILY WITH A MEAL What changed:    how much to take  how to take this  when to take this   clopidogrel 75 MG tablet Commonly known as:  PLAVIX TAKE ONE TABLET BY MOUTH DAILY What changed:    how much to take  how to take this  when to take this   cyclobenzaprine 10 MG tablet Commonly known as:  FLEXERIL Take 10 mg by mouth daily as needed for muscle spasms.   doxazosin 1 MG tablet Commonly known as:  CARDURA Take 1 tablet (1 mg total) by mouth every 12 (twelve) hours as needed (for systolic BP > 761 mm Hg.).   ENTRESTO 97-103 MG Generic drug:  sacubitril-valsartan Take 1 tablet by mouth 2 (two) times daily.   fluticasone 50 MCG/ACT nasal spray Commonly known as:  FLONASE Place 2 sprays into both nostrils daily.   furosemide 80 MG tablet Commonly known as:  LASIX Take 1 tablet (80 mg total) by mouth 2 (two) times daily.   hydrALAZINE 50 MG tablet Commonly known as:  APRESOLINE Take 75 mg (1.5 tabs) every am, 50 mg midday, and 75 mg at bedtime. Take extra 50 mg for BP > 140. What changed:  See the new instructions.   HYDROcodone-acetaminophen 5-325 MG tablet Commonly known as:  NORCO/VICODIN Take 1 tablet by mouth every 6 (six) hours as needed for severe pain.   ipratropium-albuterol 0.5-2.5 (3) MG/3ML Soln Commonly known as:  DUONEB Take 3 mLs by nebulization every 6 (six) hours as needed. What changed:  reasons to take this   isosorbide mononitrate 60 MG 24 hr tablet Commonly known as:   IMDUR TAKE ONE TABLET BY MOUTH TWICE DAILY What changed:    how much to take  how to take this  when to take this   naphazoline 0.1 % ophthalmic solution Commonly known as:  NAPHCON Place 1 drop into both eyes 4 (four) times daily as needed for irritation.   nitroGLYCERIN 0.4 MG SL tablet Commonly known as:  NITROSTAT PLACE ONE TABLET UNDER THE TONGUE EVERY 5 MINUTES FOR 3 DOSES AS NEEDED FOR CHEST PAIN What changed:  See the new instructions.   Oxycodone HCl 10 MG Tabs Take 20 mg by mouth every 8 (eight) hours.   potassium chloride 10 MEQ tablet Commonly known as:  KLOR-CON M10 Take 1 tablet (10 mEq total) by mouth daily. What changed:    how much to take  when to take this  reasons to take this   spironolactone 25 MG tablet Commonly known as:  ALDACTONE Take 0.5 tablets (12.5 mg total) by mouth daily. What changed:  when  to take this   SUPER B COMPLEX PO Take 3 tablets by mouth daily.       Allergies  Allergen Reactions  . Contrast Media [Iodinated Diagnostic Agents] Anaphylaxis  . Iohexol Anaphylaxis and Other (See Comments)    PT HAS ANAPHYLAXIS WITH CONTRAST MEDIA!  . Lipitor [Atorvastatin Calcium] Anaphylaxis and Other (See Comments)    Large doses  . Shellfish Allergy Anaphylaxis  . Sulfa Antibiotics Shortness Of Breath and Swelling  . Sulfonamide Derivatives Shortness Of Breath and Swelling  . Metrizamide Swelling  . Latex Rash and Other (See Comments)    With long periods of exposure  . Zocor [Simvastatin] Other (See Comments)    Muscle cramps    Consultations:  cardiology   Procedures/Studies: Dg Chest 2 View  Result Date: 04/20/2018 CLINICAL DATA:  Chest pain and hypertension. EXAM: CHEST - 2 VIEW COMPARISON:  01/28/2018 FINDINGS: Sternotomy wires and left-sided pacemaker are unchanged. Lungs are adequately inflated without focal airspace consolidation or effusion. Stable cardiomegaly. Stable prominence of the aortic arch. Remainder of  the exam is unchanged. IMPRESSION: No acute cardiopulmonary disease. Stable cardiomegaly and stable prominence of the aortic arch. Electronically Signed   By: Marin Olp M.D.   On: 04/20/2018 23:59   Dg Chest Port 1 View  Result Date: 04/30/2018 CLINICAL DATA:  Chest pain this evening EXAM: PORTABLE CHEST 1 VIEW COMPARISON:  Chest CT 10/11/2017 and CXR 04/20/2018 FINDINGS: Stable cardiomegaly and aneurysmal dilatation of the proximal descending thoracic aorta unchanged in appearance. Lungs are free of pulmonary consolidations. No overt pulmonary edema given effusion or pneumothorax. Median sternotomy sutures and post CABG change are redemonstrated as well as a left-sided ICD device with lead in the right ventricle. Surgical clips project over the base of the neck on the left and superior mediastinum. IMPRESSION: Stable cardiomegaly with aneurysmal dilatation of the thoracic aorta as before. No significant change. No active pulmonary disease. Electronically Signed   By: Ashley Royalty M.D.   On: 04/30/2018 00:37    (Echo, Carotid, EGD, Colonoscopy, ERCP)    Subjective:   Discharge Exam: Vitals:   05/01/18 0752 05/01/18 0755  BP: 132/84 (!) 134/93  Pulse:  (!) 106  Resp:    Temp:  98.1 F (36.7 C)  SpO2:     Vitals:   04/30/18 2024 05/01/18 0424 05/01/18 0752 05/01/18 0755  BP: 131/90 110/64 132/84 (!) 134/93  Pulse:    (!) 106  Resp:  19    Temp: 98.7 F (37.1 C) 98.1 F (36.7 C)  98.1 F (36.7 C)  TempSrc: Axillary Oral  Oral  SpO2:  98%    Weight:  75.1 kg (165 lb 9.1 oz)      General: Pt is alert, awake, not in acute distress Cardiovascular: RRR, S1/S2 +, no rubs, no gallops Respiratory: CTA bilaterally, no wheezing, no rhonchi Abdominal: Soft, NT, ND, bowel sounds + Extremities: no edema, no cyanosis    The results of significant diagnostics from this hospitalization (including imaging, microbiology, ancillary and laboratory) are listed below for reference.      Microbiology: No results found for this or any previous visit (from the past 240 hour(s)).   Labs: BNP (last 3 results) Recent Labs    09/19/17 0944 12/15/17 0518 03/25/18 1609  BNP 834.8* 2,998.3* 4,166.0*   Basic Metabolic Panel: Recent Labs  Lab 04/29/18 1810  NA 138  K 3.9  CL 100*  CO2 27  GLUCOSE 102*  BUN 29*  CREATININE 1.82*  CALCIUM  8.8*   Liver Function Tests: No results for input(s): AST, ALT, ALKPHOS, BILITOT, PROT, ALBUMIN in the last 168 hours. No results for input(s): LIPASE, AMYLASE in the last 168 hours. No results for input(s): AMMONIA in the last 168 hours. CBC: Recent Labs  Lab 04/29/18 1810  WBC 3.7*  NEUTROABS 2.5  HGB 11.7*  HCT 36.0*  MCV 78.8  PLT 166   Cardiac Enzymes: Recent Labs  Lab 04/30/18 0043 04/30/18 0602 04/30/18 1425  TROPONINI <0.03 <0.03 <0.03   BNP: Invalid input(s): POCBNP CBG: No results for input(s): GLUCAP in the last 168 hours. D-Dimer No results for input(s): DDIMER in the last 72 hours. Hgb A1c No results for input(s): HGBA1C in the last 72 hours. Lipid Profile No results for input(s): CHOL, HDL, LDLCALC, TRIG, CHOLHDL, LDLDIRECT in the last 72 hours. Thyroid function studies No results for input(s): TSH, T4TOTAL, T3FREE, THYROIDAB in the last 72 hours.  Invalid input(s): FREET3 Anemia work up No results for input(s): VITAMINB12, FOLATE, FERRITIN, TIBC, IRON, RETICCTPCT in the last 72 hours. Urinalysis    Component Value Date/Time   COLORURINE YELLOW 06/01/2017 1933   APPEARANCEUR CLEAR 06/01/2017 1933   LABSPEC 1.015 06/01/2017 1933   PHURINE 6.0 06/01/2017 1933   GLUCOSEU NEGATIVE 06/01/2017 1933   HGBUR NEGATIVE 06/01/2017 [place] NEGATIVE 06/01/2017 1933   BILIRUBINUR NEG 02/26/2013 1410   KETONESUR NEGATIVE 06/01/2017 1933   PROTEINUR NEGATIVE 06/01/2017 1933   UROBILINOGEN 0.2 03/21/2015 0045   NITRITE NEGATIVE 06/01/2017 1933   LEUKOCYTESUR NEGATIVE 06/01/2017 1933    Sepsis Labs Invalid input(s): PROCALCITONIN,  WBC,  LACTICIDVEN Microbiology No results found for this or any previous visit (from the past 240 hour(s)).   Time coordinating discharge:  38 minutes  SIGNED:   Georgette Shell, MD  [facility] 05/01/2018, 9:58 AM Pager   If 7PM-7AM, please contact night-coverage [email or web address] Password [email or web address]

## 2018-05-01 NOTE — Progress Notes (Signed)
2 Peripheral IV sites removed from left anterior forearm with catheter tip intact. Patient tolerated well.

## 2018-05-01 NOTE — Progress Notes (Addendum)
Advanced Heart Failure Rounding Note  PCP-Cardiologist: Glori Bickers, MD   Subjective:    Feeling better this am. Frustrated that people kept trying to "take his medications". Pt refused to let his home medications be checked in by pharmacy or taken home by family members.   Denies chest pain. No lightheadedness or dizziness. SBP in 110-120 range. No orthopnea or PND. No CP.   Cr 1.5 -> 1.8  Objective:   Weight Range: 165 lb 9.1 oz (75.1 kg) Body mass index is 23.09 kg/m.   Vital Signs:   Temp:  [97.6 F (36.4 C)-98.7 F (37.1 C)] 98.1 F (36.7 C) (05/08 0755) Pulse Rate:  [62-156] 106 (05/08 0755) Resp:  [10-23] 19 (05/08 0424) BP: (110-134)/(64-93) 134/93 (05/08 0755) SpO2:  [89 %-100 %] 98 % (05/08 0424) Weight:  [165 lb 9.1 oz (75.1 kg)-166 lb (75.3 kg)] 165 lb 9.1 oz (75.1 kg) (05/08 0424) Last BM Date: 04/29/18  Weight change: Filed Weights   04/30/18 1514 05/01/18 0424  Weight: 166 lb (75.3 kg) 165 lb 9.1 oz (75.1 kg)    Intake/Output:   Intake/Output Summary (Last 24 hours) at 05/01/2018 0910 Last data filed at 04/30/2018 2100 Gross per 24 hour  Intake 120 ml  Output 400 ml  Net -280 ml      Physical Exam    General: Well appearing. No resp difficulty. HEENT: Normal anicteric Neck: Supple. JVP 5-6. Carotids 2+ bilat; no bruits. No thyromegaly or nodule noted. Cor: PMI nondisplaced. RRR, 2/6 SEM Lungs: CTAB, normal effort. No wheeze Abdomen: Soft, non-tender, non-distended, no HSM. No bruits or masses. +BS  Extremities: no cyanosis, clubbing, rash, edema Neuro: alert & oriented x 3, cranial nerves grossly intact. moves all 4 extremities w/o difficulty. Affect pleasant   Telemetry   NSR 60-70s, personally reviewed.   EKG    No new tracings.    Labs    CBC Recent Labs    04/29/18 1810  WBC 3.7*  NEUTROABS 2.5  HGB 11.7*  HCT 36.0*  MCV 78.8  PLT 160   Basic Metabolic Panel Recent Labs    04/29/18 1810  NA 138  K 3.9    CL 100*  CO2 27  GLUCOSE 102*  BUN 29*  CREATININE 1.82*  CALCIUM 8.8*   Liver Function Tests No results for input(s): AST, ALT, ALKPHOS, BILITOT, PROT, ALBUMIN in the last 72 hours. No results for input(s): LIPASE, AMYLASE in the last 72 hours. Cardiac Enzymes Recent Labs    04/30/18 0043 04/30/18 0602 04/30/18 1425  TROPONINI <0.03 <0.03 <0.03    BNP: BNP (last 3 results) Recent Labs    09/19/17 0944 12/15/17 0518 03/25/18 1609  BNP 834.8* 7,371.0* 2,260.3*    ProBNP (last 3 results) No results for input(s): PROBNP in the last 8760 hours.   D-Dimer No results for input(s): DDIMER in the last 72 hours. Hemoglobin A1C No results for input(s): HGBA1C in the last 72 hours. Fasting Lipid Panel No results for input(s): CHOL, HDL, LDLCALC, TRIG, CHOLHDL, LDLDIRECT in the last 72 hours. Thyroid Function Tests No results for input(s): TSH, T4TOTAL, T3FREE, THYROIDAB in the last 72 hours.  Invalid input(s): FREET3  Other results:   Imaging     No results found.   Medications:     Scheduled Medications: . carvedilol  25 mg Oral BID WC  . clopidogrel  75 mg Oral Daily  . furosemide  80 mg Oral BID  . heparin  5,000 Units Subcutaneous Q8H  .  hydrALAZINE  50 mg Oral Q24H  . hydrALAZINE  75 mg Oral BID  . isosorbide mononitrate  60 mg Oral BID  . oxyCODONE  20 mg Oral Q8H  . potassium chloride  10 mEq Oral Daily  . sacubitril-valsartan  1 tablet Oral BID  . spironolactone  12.5 mg Oral QHS     Infusions:   PRN Medications:  acetaminophen, diphenhydrAMINE, doxazosin, hydrALAZINE, ipratropium-albuterol, nitroGLYCERIN, ondansetron (ZOFRAN) IV, polyvinyl alcohol    Patient Profile   Chad [name]. is a 57 y.o. 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   Admitted 04/29/18 into 04/30/18 with malaise and CP.   Assessment/Plan   1.  HTN - 170-190s PTA by pt report. Highest recorded BP since arrival is 137/97. - Pt manages VERY aggressively at home. He takes his BP every 2 hours regardless of symptoms. He feels "short of breath immediately" if his diastolic BP is > than "87" mm Hg. Tries to keep his Systolic <025. - OK to take 50 mg of hydralazine for BP > 427 systolic.  - OK to take 1 mg of cardura for BP > 062 systolic.  - Stressed importance of not over-treating in setting of solitary kidney.  - Have asked patient to bring his BP cuff to HF clinic next week to check against our cuff.   2. Chronic systolic BJ:SEGBTDVV cardiomyopathy. Echo (2/16) with EF 25%. S/p [employer] ICD. Echo 3/18. EF 20-25% - Volume status stable on exam.  -He has seen w Dr. Prescott Gum. Given previous dissection and aortic root replacement likely not candidate for VAD. - RHC 04/05/18 with well compensated filling pressures with mildly to moderately reduced CO. Will plan CPX as outpatient.  - Continue Entresto 97/103 bid - Continue carvedilol 25 mg twice a day.  - Continue hydralazine 75/50/75 mg (TID dosing). Can take extra as needed as above.  - Continue Imdur 60 bid.  - Continue spironolactone 12.5 mg daily.  - Has been seen previously at [facility] for transplant eval. May need f/u  3. CAD:s/p CABG - Troponins negative despite CP.  -Cardiolite 11/2015 with evidence for mild reversibility involving the anterolateral wall and a portion of the lateral wall. -We have avoided left heartcath due to dissection and CKD with solitary kidney (lost kidney due to dissection) - Continue Plavix and ASA 81 mg daily.  - Statin intolerance at any dose.  - Continue Zetia. - PCP follows lipids. If LDL remains elevated, will consider referral to Lipid Clinic to consider PCSK-9  4. AKI on CKD in setting of solitary kidney s/p dissection - Cr 1.59 -> 1.82. Educated on medication compliance and dangers of overmedicating/overtreating his HTN.  5.  H/o Type I aortic dissection:Follows with CVTS.  - He has seen w Dr. Prescott Gum. Given previous dissection and aortic root replacement likely not candidate for VAD. - No change to current plan.    6. NSVT - None noted on tele.  - No change to current plan.    7. Anxiety vs Situation Depression/PTSD from previous ICD shock - Adamantly refuses Celexa.  States "I don't have any need for that". Refused further education.  Clear for home from HF perspective. Will see in clinic next week and schedule CPX.   We will order HF/HTN meds for home for continuity to make sure he has refills.   Medication concerns reviewed with patient and  pharmacy team. Barriers identified: Compliance.   Length of Stay: 0  Annamaria Helling  05/01/2018, 9:10 AM  Advanced Heart Failure Team Pager [phone] (M-F; 7a - 4p)  Please contact [facility] for night-coverage after hours (4p -7a ) and weekends on [email or web address]  Patient seen and examined with the above-signed Advanced Practice Provider and/or Housestaff. I personally reviewed laboratory data, imaging studies and relevant notes. I independently examined the patient and formulated the important aspects of the plan. I have edited the note to reflect any of my changes or salient points. I have personally discussed the plan with the patient and/or family.  He is much improved. BP under better control. Suggested he take SBP only 2x/day at the most. Ok to keep SBP 120-140. Discussed strategies to treat if worse. Will see back in clinic for repeat CPX to assess HF progronsis and need for f/u transplant appointment at [facility].  Glori Bickers, MD  2:04 PM

## 2018-05-04 IMAGING — DX DG CHEST 2V
2 series · 2 of 2 positions shown · non-contrast
Comparison: Chest CT 10/11/2017

CLINICAL DATA: Shortness of breath

EXAM:
CHEST  2 VIEW

[chest pa]
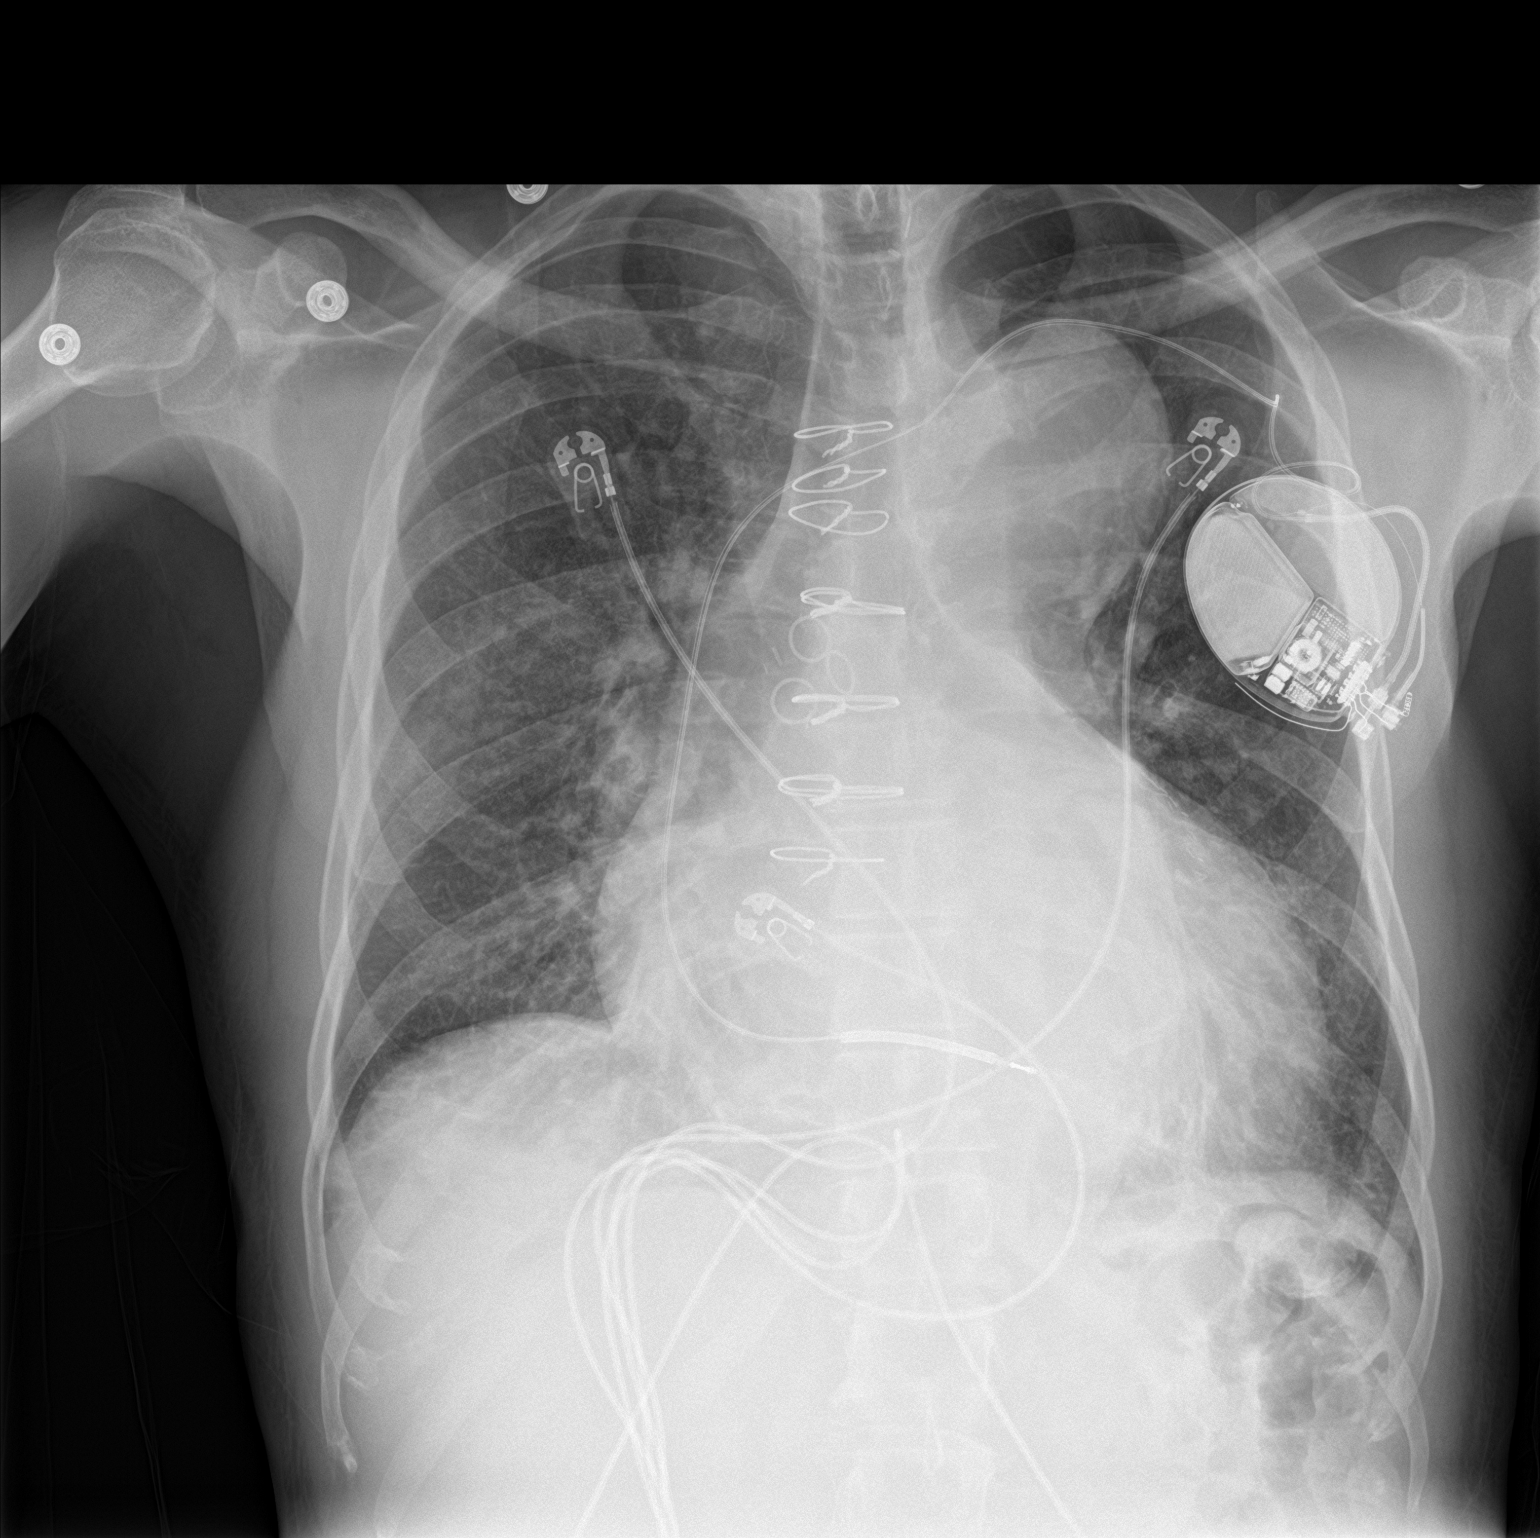

[chest lat]
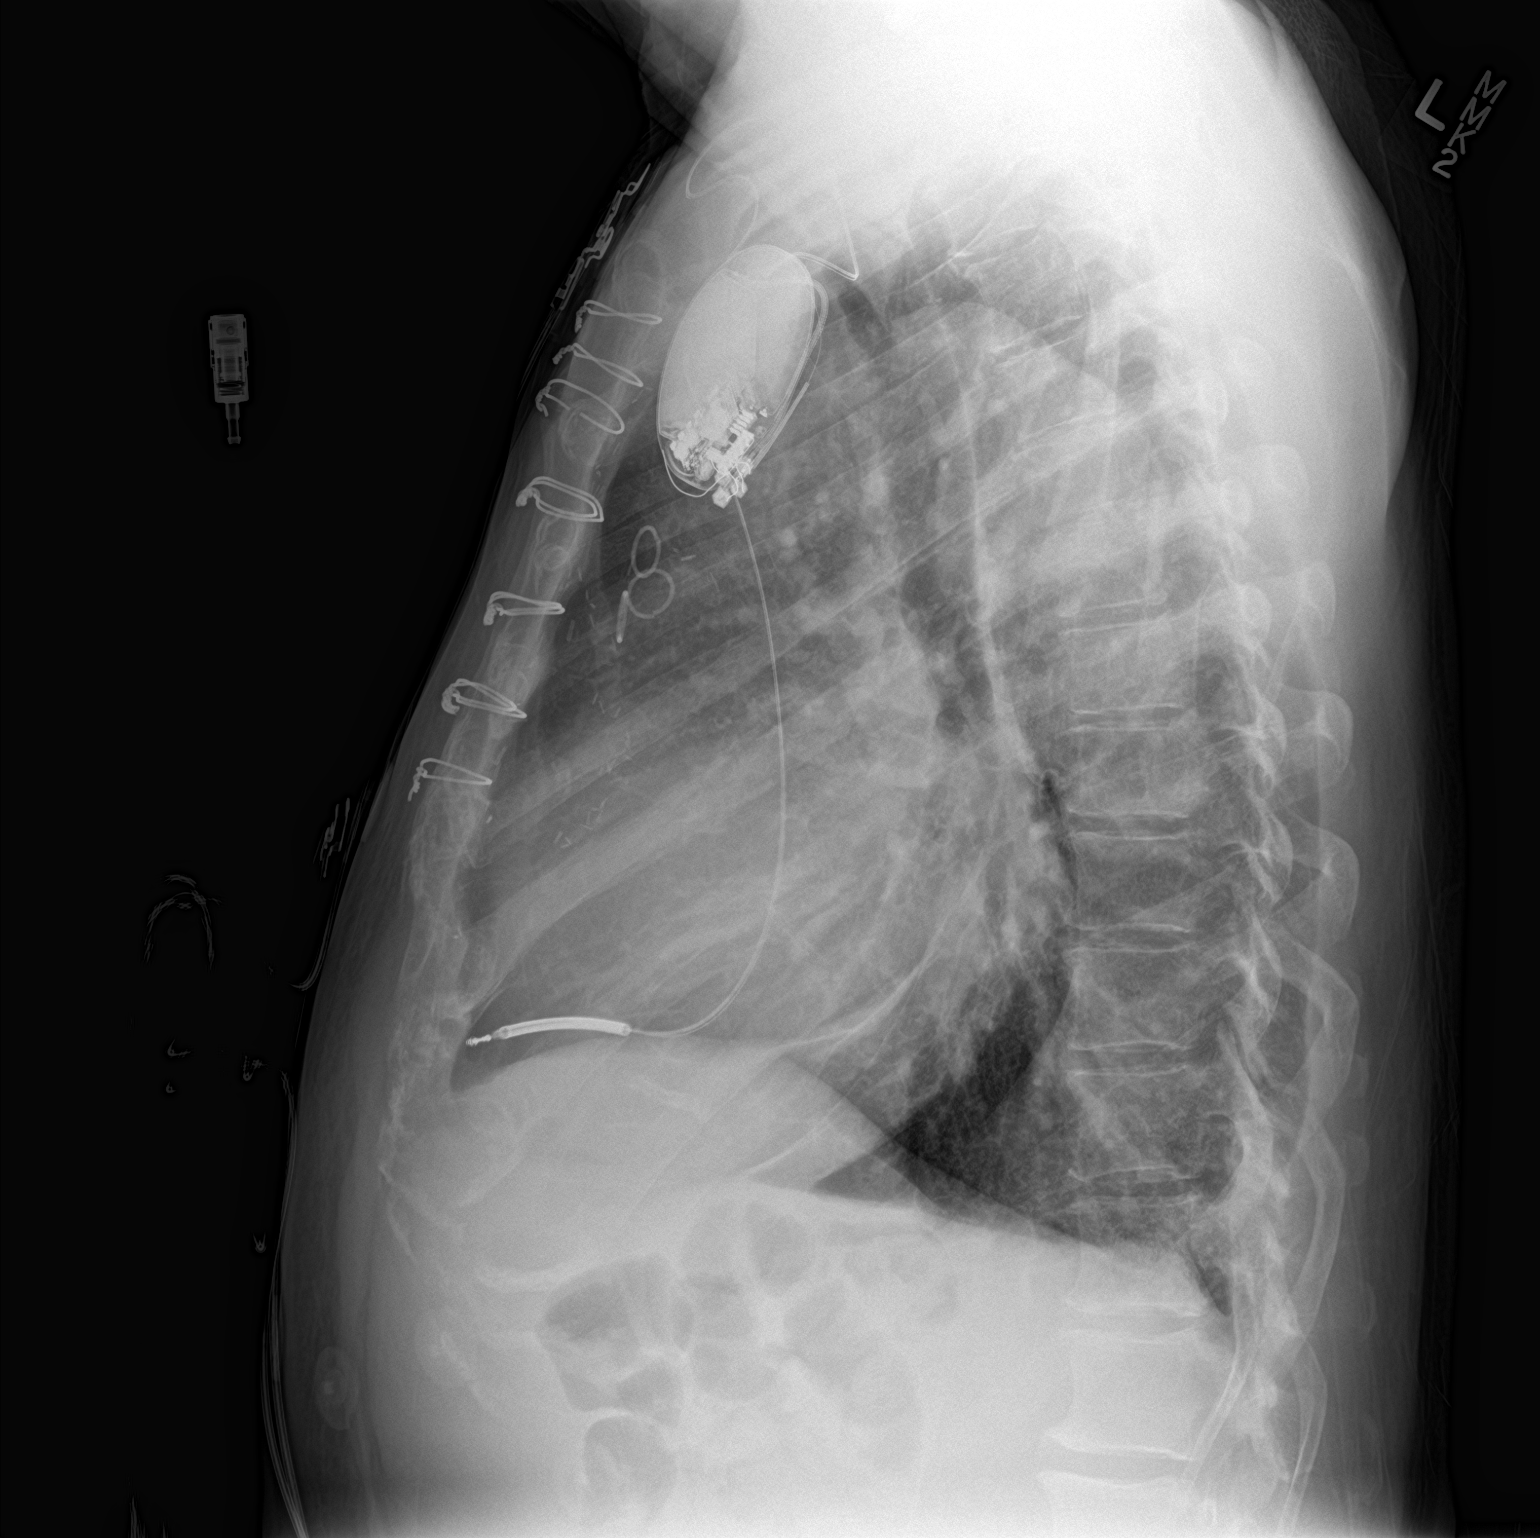

[2 of 2 positions shown; findings below may reference images not displayed]

FINDINGS: Thoracic aortic aneurysm and cardiomegaly are unchanged. There is
mild pulmonary edema. No focal airspace consolidation. No pleural
effusion or pneumothorax. Left chest wall single lead AICD is in
unchanged position.
IMPRESSION: Cardiomegaly and thoracic aortic aneurysm, unchanged. Mild pulmonary
edema.

## 2018-05-09 ENCOUNTER — Encounter (HOSPITAL_COMMUNITY): Payer: Self-pay

## 2018-05-09 ENCOUNTER — Encounter (HOSPITAL_COMMUNITY): Payer: Self-pay | Admitting: Cardiology

## 2018-05-09 ENCOUNTER — Ambulatory Visit (HOSPITAL_COMMUNITY)
Admit: 2018-05-09 | Discharge: 2018-05-09 | Disposition: A | Discharge status: Home / Self Care | Payer: Medicaid Other | Attending: Internal Medicine | Admitting: Internal Medicine

## 2018-05-09 VITALS — BP 122/88 | HR 64 | Wt 163.6 lb

## 2018-05-09 DIAGNOSIS — Z7951 Long term (current) use of inhaled steroids: Secondary | ICD-10-CM | POA: Insufficient documentation

## 2018-05-09 DIAGNOSIS — Z79899 Other long term (current) drug therapy: Secondary | ICD-10-CM | POA: Diagnosis not present

## 2018-05-09 DIAGNOSIS — G4730 Sleep apnea, unspecified: Secondary | ICD-10-CM | POA: Insufficient documentation

## 2018-05-09 DIAGNOSIS — I252 Old myocardial infarction: Secondary | ICD-10-CM | POA: Diagnosis not present

## 2018-05-09 DIAGNOSIS — I10 Essential (primary) hypertension: Secondary | ICD-10-CM

## 2018-05-09 DIAGNOSIS — I5022 Chronic systolic (congestive) heart failure: Secondary | ICD-10-CM | POA: Diagnosis not present

## 2018-05-09 DIAGNOSIS — Z85850 Personal history of malignant neoplasm of thyroid: Secondary | ICD-10-CM | POA: Diagnosis not present

## 2018-05-09 DIAGNOSIS — I2510 Atherosclerotic heart disease of native coronary artery without angina pectoris: Secondary | ICD-10-CM

## 2018-05-09 DIAGNOSIS — Z95810 Presence of automatic (implantable) cardiac defibrillator: Secondary | ICD-10-CM | POA: Insufficient documentation

## 2018-05-09 DIAGNOSIS — M109 Gout, unspecified: Secondary | ICD-10-CM | POA: Diagnosis not present

## 2018-05-09 DIAGNOSIS — Z951 Presence of aortocoronary bypass graft: Secondary | ICD-10-CM | POA: Diagnosis not present

## 2018-05-09 DIAGNOSIS — I472 Ventricular tachycardia: Secondary | ICD-10-CM | POA: Diagnosis not present

## 2018-05-09 DIAGNOSIS — E785 Hyperlipidemia, unspecified: Secondary | ICD-10-CM | POA: Diagnosis not present

## 2018-05-09 DIAGNOSIS — J45909 Unspecified asthma, uncomplicated: Secondary | ICD-10-CM | POA: Diagnosis not present

## 2018-05-09 DIAGNOSIS — R0600 Dyspnea, unspecified: Secondary | ICD-10-CM

## 2018-05-09 DIAGNOSIS — I130 Hypertensive heart and chronic kidney disease with heart failure and stage 1 through stage 4 chronic kidney disease, or unspecified chronic kidney disease: Secondary | ICD-10-CM | POA: Insufficient documentation

## 2018-05-09 DIAGNOSIS — Z7982 Long term (current) use of aspirin: Secondary | ICD-10-CM | POA: Diagnosis not present

## 2018-05-09 DIAGNOSIS — F4310 Post-traumatic stress disorder, unspecified: Secondary | ICD-10-CM | POA: Insufficient documentation

## 2018-05-09 DIAGNOSIS — Z7902 Long term (current) use of antithrombotics/antiplatelets: Secondary | ICD-10-CM | POA: Insufficient documentation

## 2018-05-09 DIAGNOSIS — N182 Chronic kidney disease, stage 2 (mild): Secondary | ICD-10-CM | POA: Diagnosis not present

## 2018-05-09 DIAGNOSIS — K219 Gastro-esophageal reflux disease without esophagitis: Secondary | ICD-10-CM | POA: Diagnosis not present

## 2018-05-09 DIAGNOSIS — N189 Chronic kidney disease, unspecified: Secondary | ICD-10-CM | POA: Diagnosis not present

## 2018-05-09 DIAGNOSIS — F329 Major depressive disorder, single episode, unspecified: Secondary | ICD-10-CM | POA: Diagnosis not present

## 2018-05-09 DIAGNOSIS — I4729 Other ventricular tachycardia: Secondary | ICD-10-CM

## 2018-05-09 DIAGNOSIS — F419 Anxiety disorder, unspecified: Secondary | ICD-10-CM

## 2018-05-09 DIAGNOSIS — I255 Ischemic cardiomyopathy: Secondary | ICD-10-CM | POA: Insufficient documentation

## 2018-05-09 LAB — BASIC METABOLIC PANEL
ANION GAP: 7 (ref 5–15)
BUN: 18 mg/dL (ref 6–20)
CALCIUM: 8.8 mg/dL — AB (ref 8.9–10.3)
CO2: 30 mmol/L (ref 22–32)
Chloride: 104 mmol/L (ref 101–111)
Creatinine, Ser: 1.64 mg/dL — ABNORMAL HIGH (ref 0.61–1.24)
GFR, EST AFRICAN AMERICAN: 53 mL/min — AB (ref >60)
GFR, EST NON AFRICAN AMERICAN: 46 mL/min — AB (ref >60)
Glucose, Bld: 101 mg/dL — ABNORMAL HIGH (ref 65–99)
POTASSIUM: 3.7 mmol/L (ref 3.5–5.1)
SODIUM: 141 mmol/L (ref 135–145)

## 2018-05-09 MED ORDER — HYDRALAZINE HCL 50 MG PO TABS: ORAL_TABLET | ORAL | 6 refills | Status: DC

## 2018-05-09 NOTE — Patient Instructions (Signed)
Your physician has recommended that you have a cardiopulmonary stress test (CPX). CPX testing is a non-invasive measurement of heart and lung function. It replaces a traditional treadmill stress test. This type of test provides a tremendous amount of information that relates not only to your present condition but also for future outcomes. This test combines measurements of you ventilation, respiratory gas exchange in the lungs, electrocardiogram (EKG), blood pressure and physical response before, during, and following an exercise protocol.  Your physician recommends that you schedule a follow-up appointment in: 6-8 weeks with Dr Haroldine Laws  Labs today We will only contact you if something comes back abnormal or we need to make some changes. Otherwise no news is good news!

## 2018-05-09 NOTE — Progress Notes (Signed)
Advanced Heart Failure Clinic Note   Patient ID: Chad Bark., male   DOB: [birth year], 57 y.o.   MRN: [id number]   Primary Cardiologist:  Dr. Glori Bickers  History of Present Illness: Chad [name] is a 57 y.o. male with history of severe HTN, coronary artery disease status post previous myocardial infarction and bypass surgery in 2006 also DES to native PDA in 2011.  He also has a history congestive heart failure secondary to ischemic cardiomyopathy EF 20-25%   He is s/p single chamber [employer] ICD.  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 Also with significant low back pain s/p 2 surgeries.   He has had episodes of CP but have avoided cath due to previous dissection and solitary kidney.  Myoview 11/2015  1. Evidence for very mild reversibility and ischemia involving the anterolateral wall and a portion of the lateral wall. 2. Large infarct involving the inferior wall. There is dyskinesia in the inferior wall. 3. Left ventricular ejection fraction is 32%. Left ventricle dilatation.  In 11/17 admitted for ICD shocks due to fractured RV lead. Underwent lead extraction and replacement.   We saw him in 2/18 for acute visit due to increasing dyspnea and volume overload. ECHO EF 20-25% moderate RV dysfunction. Moderate AI and severe MR. Ao Root 32mm-> 25mm.   In 4/18, he saw Dr. 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 He was referred to [facility] Transplant team for further evaluation in 6/18 and felt to be too early for transplant based on  CPX.  Admitted 12/18 with volume overload and diuresed 11 pounds.   Admitted 5/6-05/01/18 with chest pain. Troponins negative and EKG was unchanged. Chest pain thought to be related to HTN and possibly anxiety. He declined celexa. He was instructed to take extra 50 mg hydralazine for SBP >140 and 1 mg cardura for SBP >160.   Today he returns for post hospital follow up. Overall feeling much better since admission. 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 He is SOB at times with walking and with ADLs, but only if his SBP >126. This occurs a few times per week, but not daily. He continues to check his BP every 2-3 hours. Denies orthopnea, PND, edema. No CP. He occasionally gets dizzy with moving his head quickly, but not with sitting to standing. Weights are stable at home ~160 lbs. He took 1/2 of a cardura last night for SBP 140s, but has not taken PRNs otherwise. Compliant with medication. Limits fluid and salt intake.   ICD interrogated personally in clinic: 2 episodes of NSVT with no therapy needed. No VT/VF. Active 1-2 hours/day   CPX 4/18   FVC 3.11 (73%)    FEV1 2.40 (71%)     FEV1/FVC 77 (97%)     MVV 117 (77%)  Resting HR: 75 Peak HR: 134  (81% age predicted max HR) BP rest: 120/68 BP peak: 146/82 Peak VO2: 18.6 (55% predicted peak VO2) VE/VCO2 slope: 31 OUES: 2.01 Peak RER: 1.05 Ventilatory Threshold: 16.0 (47% predicted or measured peak VO2) VE/MVV: 49% O2pulse: 13  (76%  predicted O2pulse)  3/18: ECHO EF 20-25% Moderate AI, moderate to severe MR, moderate TR. Ao Root 50cm 10/22/2015: ECHO EF 20-25% 11/26/12 ECHO EF 35-40%  Review of systems complete and found to be negative unless listed in HPI.   Past Medical History:  Diagnosis Date  . AICD (automatic cardioverter/defibrillator) present   . Anemia   . Anginal pain ([place])   . Aortic dissection, thoracoabdominal ([place])    7/10: Type I s/p repair  . Asthma   . CAD (coronary artery disease)    a. s/p CABG  2006;  b. DES to PDA 2011 (cath: Dx not seen, dRCA/PDA tx with DES; S-PDA occluded (culprit), S-Dx occluded, S-RI and OM ok, L-LAD ok  . Carotid stenosis    dopplers 2011: 0-39% bilat.  . Chest pain syndrome   . CHF (congestive heart failure) ([place])   . Chronic bronchitis ([place])   . Chronic lower back pain   . Chronic systolic heart failure (HCC)    a. 12/13 ECHO: EF 35-40%, sept, apical & posterobasal HK, LV mod dil & sys fx mod reduced, mild AI, MV mild reg, TV mild reg  . Complication of anesthesia    "difficult to wake afterwards a couple of times"  . 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 Dyspnea   . Family history of adverse reaction to anesthesia    "sister hard to wake up"  . GERD (gastroesophageal reflux disease)   . Gout   . Heart murmur   . HLD (hyperlipidemia)   . HTN (hypertension)    severe  . Myocardial infarction [facility])    "many" (11/07/2016)  . PONV (postoperative nausea and vomiting)   . Sleep apnea    "never RX'd mask" (11/07/2016)  . Thyroid cancer ([place])    Hertle Cell    Current Outpatient Medications  Medication Sig Dispense Refill  . albuterol (PROVENTIL HFA;VENTOLIN HFA) 108 (90 Base) MCG/ACT inhaler Inhale 2 puffs into the lungs every 6 (six) hours as needed for wheezing or shortness of breath. 1 Inhaler 0  . aspirin EC 81 MG tablet Take 1 tablet (81 mg total) by mouth daily. Can take 4 tabs (324 mg total) as needed for chest pain. DO NOT TAKE multiple 325 mg tabs. 40 tablet 6  . B Complex-C (SUPER B COMPLEX PO) Take 3 tablets by mouth daily.     . carvedilol (COREG) 25 MG tablet TAKE ONE TABLET BY MOUTH TWICE DAILY WITH A MEAL (Patient taking differently: Take 25 mg by mouth two to three times a day) 60 tablet 6  . clopidogrel (PLAVIX) 75 MG tablet TAKE ONE TABLET BY MOUTH DAILY (Patient taking differently: Take 75 mg by mouth once a day) 90 tablet 3  . cyclobenzaprine (FLEXERIL) 10 MG tablet Take  10 mg by mouth daily as needed for muscle spasms.     [name] doxazosin (CARDURA) 1 MG tablet Take 1 tablet (1 mg total) by mouth every 12 (twelve) hours as needed (for systolic BP > 253 mm Hg.). 40 tablet 3  . fluticasone (FLONASE) 50 MCG/ACT nasal spray Place 2 sprays into both nostrils daily.     . furosemide (LASIX) 80 MG tablet Take 1 tablet (80 mg total) by mouth 2 (two) times daily. 60 tablet 6  . hydrALAZINE (APRESOLINE) 50 MG tablet Take 75 mg (1.5 tabs) every am, 50 mg midday, and 75 mg at bedtime. Take extra 50 mg for BP >  140. 120 tablet 6  . HYDROcodone-acetaminophen (NORCO/VICODIN) 5-325 MG tablet Take 1 tablet by mouth every 6 (six) hours as needed for severe pain. 15 tablet 0  . ipratropium-albuterol (DUONEB) 0.5-2.5 (3) MG/3ML SOLN Take 3 mLs by nebulization every 6 (six) hours as needed. (Patient taking differently: Take 3 mLs by nebulization every 6 (six) hours as needed (for shortness of breath or wheezing). ) 360 mL 3  . isosorbide mononitrate (IMDUR) 60 MG 24 hr tablet TAKE ONE TABLET BY MOUTH TWICE DAILY (Patient taking differently: Take 60 mg by mouth two to three times a day) 60 tablet 6  . naphazoline (NAPHCON) 0.1 % ophthalmic solution Place 1 drop into both eyes 4 (four) times daily as needed for irritation.    . nitroGLYCERIN (NITROSTAT) 0.4 MG SL tablet PLACE ONE TABLET UNDER THE TONGUE EVERY 5 MINUTES FOR 3 DOSES AS NEEDED FOR CHEST PAIN (Patient taking differently: PLACE 0.4 MG UNDER THE TONGUE EVERY 5 MINUTES FOR 3 DOSES AS NEEDED FOR CHEST PAIN) 25 tablet 12  . Oxycodone HCl 10 MG TABS Take 20 mg by mouth every 8 (eight) hours.     . potassium chloride (KLOR-CON M10) 10 MEQ tablet Take 1 tablet (10 mEq total) by mouth daily. (Patient taking differently: Take 10-20 mEq by mouth daily as needed (for body cramps). ) 30 tablet 6  . sacubitril-valsartan (ENTRESTO) 97-103 MG Take 1 tablet by mouth 2 (two) times daily.    [name] spironolactone (ALDACTONE) 25 MG tablet Take 0.5 tablets  (12.5 mg total) by mouth daily. (Patient taking differently: Take 12.5 mg by mouth at bedtime. ) 30 tablet 6   No current facility-administered medications for this encounter.     Allergies  Allergen Reactions  . Contrast Media [Iodinated Diagnostic Agents] Anaphylaxis  . Iohexol Anaphylaxis and Other (See Comments)    PT HAS ANAPHYLAXIS WITH CONTRAST MEDIA!  . Lipitor [Atorvastatin Calcium] Anaphylaxis and Other (See Comments)    Large doses  . Shellfish Allergy Anaphylaxis  . Sulfa Antibiotics Shortness Of Breath and Swelling  . Sulfonamide Derivatives Shortness Of Breath and Swelling  . Metrizamide Swelling  . Latex Rash and Other (See Comments)    With long periods of exposure  . Zocor [Simvastatin] Other (See Comments)    Muscle cramps    Vital Signs: Vitals:   05/09/18 1007  BP: 122/88  Pulse: 64  SpO2: 96%  Weight: 163 lb 9.6 oz (74.2 kg)   Filed Weights   05/09/18 1007  Weight: 163 lb 9.6 oz (74.2 kg)    Wt Readings from Last 3 Encounters:  05/09/18 163 lb 9.6 oz (74.2 kg)  05/01/18 165 lb 9.1 oz (75.1 kg)  04/05/18 170 lb (77.1 kg)   Physical Exam: General: No resp difficulty. HEENT: Normal Neck: Supple. JVP flat. Carotids 2+ bilat; no bruits. No thyromegaly or nodule noted. Cor: PMI laterally displaced. RRR, 3/6 high pitched musical murmur at RUSB radiating across precordium, 2/6 MR Lungs: CTAB, normal effort. Abdomen: Soft, non-tender, non-distended, no HSM. No bruits or masses. +BS  Extremities: No cyanosis, clubbing, or rash. R and LLE no edema.  Neuro: Alert & orientedx3, cranial nerves grossly intact. moves all 4 extremities w/o difficulty. Appears anxious.  ASSESSMENT/Plan  1. Chronic systolic HF: Ischemic cardiomyopathy.  Echo (2/16) with EF 25%. S/p [employer] ICD. Echo 3/18. EF 20-25%. Echo 04/18/18: EF 20-25%, grade 2 DD - RHC 04/05/18 with well compensated filling pressures with mildly to moderately reduced CO.  - He has seen  w Dr. Prescott Gum. Given previous dissection and aortic root replacement likely not a candidate for VAD. - He has been seen [facility] at [facility] for [facility] eval. May need f/u. He is agreeable to repeat CPX. - Volume status stable.  - NYHA class III-IIIb, difficult to assess because he attributes SOB to elevated BP, not activity.  - Continue lasix 80 mg BID - Continue Entresto 97/103 bid - Continue carvedilol 25 mg twice a day.  - Continue hydralazine 75 mg bid/ Imdur 60 bid.   - Continue spironolactone 12.5 mg daily.  - Repeat CPX.  - Work note provided.   2. CAD: s/p CABG.   - Recent admit for CP with negative troponin and unchanged EKG. He has not had any CP since discharge.  -Cardiolite 11/2015 with evidence for mild reversibility involving the anterolateral wall and a portion of the lateral wall. - We have avoided left heart cath due to dissection and CKD with solitary kidney (lost kidney due to dissection) - Continue Plavix and ASA 81 mg daily.   - Statin intolerance at any dose.  - Continue Zetia. - PCP follows lipids. If LDL remains elevated, will consider referral to Lipid Clinic to consider [facility]-9  3. CKD in setting of solitary kidney s/p dissection - AKI during recent admit. BMET today  4. HTN:  - SBP mostly 120s at home, once >140. Well controlled today. Home cuff reading matches cuff in clinic today.  - He continues to check BP every 2-3 hours. He feels SOB with SBP >126 or DBP >88. - OK to take 50 mg of hydralazine for BP >094 systolic.  - OK to take 1 mg of cardura for BP >709 systolic.  - Stressed importance of not over-treating in setting of solitary kidney.   5. H/o Type I aortic dissection: Follows with CVTS.  - He has seen w Dr. Prescott Gum. Given previous dissection and aortic root replacement likely not candidate for VAD. - Echo 04/18/18: aortic root, ascending aorta, and aortic arch all mildly dilated  6. NSVT - ICD interrogated personally. No NSVT since 04/25/18. No  VF/VT  7. 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   Georgiana Shore, NP 10:11 AM  Greater than 50% of the 25 minute visit was spent in counseling/coordination of care regarding disease state education, salt/fluid restriction, sliding scale diuretics, and medication compliance.

## 2018-05-13 ENCOUNTER — Ambulatory Visit (INDEPENDENT_AMBULATORY_CARE_PROVIDER_SITE_OTHER): Payer: Medicaid Other | Admitting: *Deleted

## 2018-05-13 DIAGNOSIS — R001 Bradycardia, unspecified: Secondary | ICD-10-CM

## 2018-05-13 NOTE — Progress Notes (Signed)
Remote ICD transmission.   

## 2018-05-14 ENCOUNTER — Encounter: Payer: Self-pay | Admitting: Cardiology

## 2018-05-15 ENCOUNTER — Encounter (HOSPITAL_COMMUNITY): Payer: Self-pay | Admitting: *Deleted

## 2018-05-15 ENCOUNTER — Other Ambulatory Visit (HOSPITAL_COMMUNITY): Payer: Self-pay | Admitting: *Deleted

## 2018-05-15 ENCOUNTER — Ambulatory Visit (HOSPITAL_COMMUNITY): Payer: Medicaid Other | Attending: Internal Medicine

## 2018-05-15 DIAGNOSIS — I5022 Chronic systolic (congestive) heart failure: Secondary | ICD-10-CM | POA: Insufficient documentation

## 2018-05-16 ENCOUNTER — Other Ambulatory Visit (HOSPITAL_COMMUNITY): Payer: Self-pay | Admitting: Cardiology

## 2018-05-16 LAB — CUP PACEART REMOTE DEVICE CHECK
Battery Remaining Longevity: 96 mo
Date Time Interrogation Session: 20190520122000
HIGH POWER IMPEDANCE MEASURED VALUE: 57 Ohm
Implantable Lead Location: 753860
Implantable Lead Model: 181
Implantable Pulse Generator Implant Date: 20131220
Lead Channel Setting Pacing Pulse Width: 0.4 ms
MDC IDC LEAD IMPLANT DT: 20171114
MDC IDC LEAD SERIAL: 333496
MDC IDC MSMT BATTERY REMAINING PERCENTAGE: 100 %
MDC IDC MSMT LEADCHNL RV IMPEDANCE VALUE: 462 Ohm
MDC IDC MSMT LEADCHNL RV PACING THRESHOLD AMPLITUDE: 0.6 V
MDC IDC MSMT LEADCHNL RV PACING THRESHOLD PULSEWIDTH: 0.4 ms
MDC IDC PG SERIAL: 124654
MDC IDC SET LEADCHNL RV PACING AMPLITUDE: 3.5 V
MDC IDC SET LEADCHNL RV SENSING SENSITIVITY: 0.5 mV
MDC IDC STAT BRADY RV PERCENT PACED: 0 %

## 2018-05-20 ENCOUNTER — Encounter: Payer: Self-pay | Admitting: Internal Medicine

## 2018-05-21 ENCOUNTER — Ambulatory Visit: Payer: Medicaid Other | Admitting: Family Medicine

## 2018-05-21 ENCOUNTER — Other Ambulatory Visit: Payer: Self-pay

## 2018-05-21 ENCOUNTER — Encounter: Payer: Self-pay | Admitting: Family Medicine

## 2018-05-21 VITALS — BP 118/68 | HR 82 | Temp 98.5°F | Ht 71.0 in | Wt 167.0 lb

## 2018-05-21 DIAGNOSIS — M10071 Idiopathic gout, right ankle and foot: Secondary | ICD-10-CM | POA: Diagnosis present

## 2018-05-21 MED ORDER — PREDNISONE 20 MG PO TABS: 20.0000 mg | ORAL_TABLET | Freq: Two times a day (BID) | ORAL | 0 refills | Status: AC

## 2018-05-21 NOTE — Progress Notes (Signed)
     Subjective: Chief Complaint  Patient presents with  . gout    pain in both feet, toes  using advil but only has one kidney so he needs to use this sparingly     HPI: Chad [name]. is a 57 y.o. presenting to clinic today to discuss the following:  Patient states he has a history of gout. He was previously treated several months ago for an acute gout flare with "steroids" and it did improve but it is now back again. He has been taking Ibuprofen that has been helping but has one kidney with decreased kidney function and is concerned that it will harm him to continue taking Ibuprofen. This most recent bout has been ongoing for about 2 months.   Health Maintenance: none  ROS noted in HPI.   Past Medical, Surgical, Social, and Family History Reviewed & Updated per EMR.   Pertinent Historical Findings include:   Social History   Tobacco Use  Smoking Status Never Smoker  Smokeless Tobacco Never Used   Objective: BP 118/68   Pulse 82   Temp 98.5 F (36.9 C) (Oral)   Ht 5\' 11"  (1.803 m)   Wt 167 lb (75.8 kg)   SpO2 97%   BMI 23.29 kg/m  Vitals and nursing notes reviewed  Physical Exam  Constitutional: He appears well-developed and well-nourished.  Eyes: Pupils are equal, round, and reactive to light. Conjunctivae and EOM are normal.  Cardiovascular: Normal rate, regular rhythm, normal heart sounds and intact distal pulses.  No murmur heard. Pulmonary/Chest: Effort normal and breath sounds normal. He has no wheezes. He has no rales.  Abdominal: Soft. Bowel sounds are normal.  Skin: Capillary refill takes less than 2 seconds.  MTP of right foot warm to touch and swollen, TTP. MTP of left foot mildly swollen, warm to touch and TTP.    No results found for this or any previous visit (from the past 72 hour(s)).  Assessment/Plan:  Acute idiopathic gout of right foot Patient symptoms, history, and physical exam all consistent with acute gout attack. Patient has  one kideny and CKD so I am wary of   This acute flare has been ongoing for 2 weeks and colchicine required "prior authorization" according to the patient.   Prescribed 10 day course of Prednisone 40mg  for acute flare.  In two weeks I instructed patient to return to start a prophylactic medication allopurinol.     PATIENT EDUCATION PROVIDED: See AVS    Diagnosis and plan along with any newly prescribed medication(s) were discussed in detail with this patient today. The patient verbalized understanding and agreed with the plan. Patient advised if symptoms worsen return to clinic or ER.   Health Maintainance:   No orders of the defined types were placed in this encounter.   Meds ordered this encounter  Medications  . predniSONE (DELTASONE) 20 MG tablet    Sig: Take 1 tablet (20 mg total) by mouth 2 (two) times daily with a meal for 10 days.    Dispense:  20 tablet    Refill:  0     Harolyn Rutherford, DO 05/21/2018, 4:49 PM PGY-1, [place]

## 2018-05-21 NOTE — Patient Instructions (Signed)
It was great to meet you today! Thank you for letting me participate in your care!  Today, we discussed your recent gout flare. I am treating your with an oral glucocorticoid for 10 total days which should stop the flare.  After that please return to the clinic so I can start you on another medication to control  I will avoid NSAIDs at this time due to your kidney function.  Be well, Harolyn Rutherford, DO PGY-1, [facility]

## 2018-05-22 DIAGNOSIS — M10071 Idiopathic gout, right ankle and foot: Principal | ICD-10-CM

## 2018-05-22 DIAGNOSIS — M1A0610 Idiopathic chronic gout, right knee, without tophus (tophi): Secondary | ICD-10-CM | POA: Insufficient documentation

## 2018-05-22 DIAGNOSIS — M1A0720 Idiopathic chronic gout, left ankle and foot, without tophus (tophi): Secondary | ICD-10-CM | POA: Insufficient documentation

## 2018-05-22 NOTE — Assessment & Plan Note (Signed)
Patient symptoms, history, and physical exam all consistent with acute gout attack. Patient has one kideny and CKD so I am wary of   This acute flare has been ongoing for 2 weeks and colchicine required "prior authorization" according to the patient.   Prescribed 10 day course of Prednisone 40mg  for acute flare.  In two weeks I instructed patient to return to start a prophylactic medication allopurinol.

## 2018-05-29 ENCOUNTER — Other Ambulatory Visit: Payer: Self-pay

## 2018-05-29 ENCOUNTER — Ambulatory Visit (HOSPITAL_COMMUNITY)
Admission: RE | Admit: 2018-05-29 | Discharge: 2018-05-29 | Disposition: A | Discharge status: Home / Self Care | Payer: Medicaid Other | Source: Ambulatory Visit | Attending: Internal Medicine | Admitting: Internal Medicine

## 2018-05-29 ENCOUNTER — Encounter (HOSPITAL_COMMUNITY): Payer: Self-pay | Admitting: Internal Medicine

## 2018-05-29 VITALS — BP 126/75 | HR 76

## 2018-05-29 DIAGNOSIS — N189 Chronic kidney disease, unspecified: Secondary | ICD-10-CM | POA: Diagnosis not present

## 2018-05-29 DIAGNOSIS — K219 Gastro-esophageal reflux disease without esophagitis: Secondary | ICD-10-CM | POA: Diagnosis not present

## 2018-05-29 DIAGNOSIS — F4310 Post-traumatic stress disorder, unspecified: Secondary | ICD-10-CM | POA: Insufficient documentation

## 2018-05-29 DIAGNOSIS — Z7951 Long term (current) use of inhaled steroids: Secondary | ICD-10-CM | POA: Insufficient documentation

## 2018-05-29 DIAGNOSIS — J45909 Unspecified asthma, uncomplicated: Secondary | ICD-10-CM | POA: Diagnosis not present

## 2018-05-29 DIAGNOSIS — E890 Postprocedural hypothyroidism: Secondary | ICD-10-CM | POA: Insufficient documentation

## 2018-05-29 DIAGNOSIS — I509 Heart failure, unspecified: Secondary | ICD-10-CM | POA: Diagnosis present

## 2018-05-29 DIAGNOSIS — I2510 Atherosclerotic heart disease of native coronary artery without angina pectoris: Secondary | ICD-10-CM | POA: Insufficient documentation

## 2018-05-29 DIAGNOSIS — Z95828 Presence of other vascular implants and grafts: Secondary | ICD-10-CM | POA: Diagnosis not present

## 2018-05-29 DIAGNOSIS — Z951 Presence of aortocoronary bypass graft: Secondary | ICD-10-CM | POA: Insufficient documentation

## 2018-05-29 DIAGNOSIS — Z905 Acquired absence of kidney: Secondary | ICD-10-CM | POA: Diagnosis not present

## 2018-05-29 DIAGNOSIS — Z95810 Presence of automatic (implantable) cardiac defibrillator: Secondary | ICD-10-CM | POA: Diagnosis not present

## 2018-05-29 DIAGNOSIS — Z85850 Personal history of malignant neoplasm of thyroid: Secondary | ICD-10-CM | POA: Diagnosis not present

## 2018-05-29 DIAGNOSIS — I5022 Chronic systolic (congestive) heart failure: Secondary | ICD-10-CM | POA: Diagnosis not present

## 2018-05-29 DIAGNOSIS — Z882 Allergy status to sulfonamides status: Secondary | ICD-10-CM | POA: Insufficient documentation

## 2018-05-29 DIAGNOSIS — Z79899 Other long term (current) drug therapy: Secondary | ICD-10-CM | POA: Insufficient documentation

## 2018-05-29 DIAGNOSIS — I472 Ventricular tachycardia: Secondary | ICD-10-CM | POA: Insufficient documentation

## 2018-05-29 DIAGNOSIS — Z7982 Long term (current) use of aspirin: Secondary | ICD-10-CM | POA: Insufficient documentation

## 2018-05-29 DIAGNOSIS — G4730 Sleep apnea, unspecified: Secondary | ICD-10-CM | POA: Insufficient documentation

## 2018-05-29 DIAGNOSIS — M109 Gout, unspecified: Secondary | ICD-10-CM | POA: Diagnosis not present

## 2018-05-29 DIAGNOSIS — Z7902 Long term (current) use of antithrombotics/antiplatelets: Secondary | ICD-10-CM | POA: Insufficient documentation

## 2018-05-29 DIAGNOSIS — I255 Ischemic cardiomyopathy: Secondary | ICD-10-CM | POA: Insufficient documentation

## 2018-05-29 DIAGNOSIS — E785 Hyperlipidemia, unspecified: Secondary | ICD-10-CM | POA: Diagnosis not present

## 2018-05-29 DIAGNOSIS — I130 Hypertensive heart and chronic kidney disease with heart failure and stage 1 through stage 4 chronic kidney disease, or unspecified chronic kidney disease: Secondary | ICD-10-CM | POA: Insufficient documentation

## 2018-05-29 DIAGNOSIS — F329 Major depressive disorder, single episode, unspecified: Secondary | ICD-10-CM | POA: Insufficient documentation

## 2018-05-29 DIAGNOSIS — Z91041 Radiographic dye allergy status: Secondary | ICD-10-CM | POA: Insufficient documentation

## 2018-05-29 DIAGNOSIS — I252 Old myocardial infarction: Secondary | ICD-10-CM | POA: Diagnosis not present

## 2018-05-29 DIAGNOSIS — Z888 Allergy status to other drugs, medicaments and biological substances status: Secondary | ICD-10-CM | POA: Insufficient documentation

## 2018-05-29 DIAGNOSIS — Z91040 Latex allergy status: Secondary | ICD-10-CM | POA: Insufficient documentation

## 2018-05-29 NOTE — Patient Instructions (Addendum)
Continue current medicines. Dr Haroldine Laws recommends exercising at least 4 days per week.   Start with 20 minute workout on treadmill  5 min warmup slow  2 mins fast 1 min slow 2 mins fast 1 min slow 2 mins fast  1 min slow  5 min cooldown slow   We will see you back in 2 months. Call sooner if not feeling well.

## 2018-05-29 NOTE — Progress Notes (Signed)
Advanced Heart Failure Clinic Note   Patient ID: Chad Bark., male   DOB: 1961/11/11, 57 y.o.   MRN: [id number]   Primary Cardiologist:  Dr. Glori Bickers  History of Present Illness: Chad [name] is a 57 y.o. male with history of severe HTN, coronary artery disease status post previous myocardial infarction and bypass surgery in 2006 also DES to native PDA in 2011.  He also has a history congestive heart failure secondary to ischemic cardiomyopathy EF 20-25%   He is s/p single chamber [employer] ICD.  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 Also with significant low back pain s/p 2 surgeries.   He has had episodes of CP but have avoided cath due to previous dissection and solitary kidney.  Myoview 11/2015  1. Evidence for very mild reversibility and ischemia involving the anterolateral wall and a portion of the lateral wall. 2. Large infarct involving the inferior wall. There is dyskinesia in the inferior wall. 3. Left ventricular ejection fraction is 32%. Left ventricle dilatation.  In 11/17 admitted for ICD shocks due to fractured RV lead. Underwent lead extraction and replacement.   We saw him in 2/18 for acute visit due to increasing dyspnea and volume overload. ECHO EF 20-25% moderate RV dysfunction. Moderate AI and severe MR. Ao Root 17mm-> 52mm.   In 4/18, he saw Dr. 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 He was referred to [facility] Transplant team for further evaluation in 6/18 and felt to be too early for transplant based on  CPX.  Admitted 12/18 with volume overload and diuresed 11 pounds.   Admitted 5/6-05/01/18 with chest pain. Troponins negative and EKG was unchanged. Chest pain thought to be related to HTN and possibly anxiety. He declined celexa. He was instructed to take extra 50 mg hydralazine for SBP >140 and 1 mg cardura for SBP >160.   Returns for f/u. Says he is feeling great and almost back to his baseline. Very active working around the house but not exercising regularly. Not checking BPs as much and is much less anxious. No edema, orthopnea or PND. No CP. Taking all meds as prescribed   ICD interrogated personally in clinic: No VT/AF. Actiivity 2-3 hours per day. Personally reviewed   CPX 05/15/18 FVC 2.86 (67%)    FEV1 2.26 (68%)     FEV1/FVC 79 (100%)     MVV 106 (70%)    Resting HR: 71 Peak HR: 110  (67% age predicted max HR)  BP rest: 100/64 BP peak: 118/66  Peak VO2: 18.7 (56% predicted peak VO2) VE/VCO2 slope: 34 OUES: 1.45 Peak RER: 1.04 Ventilatory Threshold: 11.8 (35% predicted and 63% measured peak VO2) VE/MVV: 43% O2pulse: 12  (80% predicted O2pulse)  CPX 4/18   FVC 3.11 (73%)    FEV1 2.40 (71%)     FEV1/FVC 77 (97%)     MVV 117 (77%)  Resting HR: 75 Peak HR: 134  (81% age predicted max HR) BP rest: 120/68 BP peak: 146/82 Peak VO2: 18.6 (55% predicted peak VO2) VE/VCO2 slope: 31 OUES: 2.01 Peak RER: 1.05 Ventilatory  Threshold: 16.0 (47% predicted or measured peak VO2) VE/MVV: 49% O2pulse: 13  (76% predicted O2pulse)  3/18: ECHO EF 20-25% Moderate AI, moderate to severe MR, moderate TR. Ao Root 50cm 10/22/2015: ECHO EF 20-25% 11/26/12 ECHO EF 35-40%  Review of systems complete and found to be negative unless listed in HPI.   Past Medical History:  Diagnosis Date  . AICD (automatic cardioverter/defibrillator) present   . Anemia   . Anginal pain ([place])   . Aortic dissection, thoracoabdominal ([place])    7/10: Type I s/p repair  .  Asthma   . CAD (coronary artery disease)    a. s/p CABG 2006;  b. DES to PDA 2011 (cath: Dx not seen, dRCA/PDA tx with DES; S-PDA occluded (culprit), S-Dx occluded, S-RI and OM ok, L-LAD ok  . Carotid stenosis    dopplers 2011: 0-39% bilat.  . Chest pain syndrome   . CHF (congestive heart failure) ([place])   . Chronic bronchitis ([place])   . Chronic lower back pain   . Chronic systolic heart failure (HCC)    a. 12/13 ECHO: EF 35-40%, sept, apical & posterobasal HK, LV mod dil & sys fx mod reduced, mild AI, MV mild reg, TV mild reg  . Complication of anesthesia    "difficult to wake afterwards a couple of times"  . 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 Dyspnea   . Family history of adverse reaction to anesthesia    "sister hard to wake up"  . GERD (gastroesophageal reflux disease)   . Gout   . Heart murmur   . HLD (hyperlipidemia)   . HTN (hypertension)    severe  . Myocardial infarction [facility])    "many" (11/07/2016)  . PONV (postoperative nausea and vomiting)   . Sleep apnea    "never RX'd mask" (11/07/2016)  . Thyroid cancer ([place])    Hertle Cell    Current Outpatient Medications  Medication Sig Dispense Refill  . albuterol (PROVENTIL HFA;VENTOLIN HFA) 108 (90 Base) MCG/ACT inhaler Inhale 2 puffs into the lungs every 6 (six) hours as needed for wheezing or shortness of breath. 1 Inhaler 0  . aspirin EC 81 MG tablet Take 1 tablet (81 mg total) by mouth daily. Can take 4 tabs (324 mg total) as needed for chest pain. DO NOT TAKE multiple 325 mg tabs. 40 tablet 6  . B Complex-C (SUPER B COMPLEX PO) Take 3 tablets by mouth daily.     . carvedilol (COREG) 25 MG tablet TAKE ONE TABLET BY MOUTH TWICE DAILY WITH A MEAL (Patient taking differently: Take 25 mg by mouth two to three times a day) 60 tablet 6  . clopidogrel (PLAVIX) 75 MG tablet TAKE ONE TABLET BY MOUTH DAILY (Patient taking differently: Take 75 mg by mouth once a day) 90  tablet 3  . cyclobenzaprine (FLEXERIL) 10 MG tablet Take 10 mg by mouth daily as needed for muscle spasms.     [name] doxazosin (CARDURA) 1 MG tablet Take 1 tablet (1 mg total) by mouth every 12 (twelve) hours as needed (for systolic BP > 322 mm Hg.). 40 tablet 3  . ENTRESTO 97-103 MG TAKE 1 TABLET BY MOUTH TWICE DAILY 60 tablet 3  . fluticasone (FLONASE) 50 MCG/ACT nasal spray Place 2 sprays into both nostrils daily.     . furosemide (LASIX) 80 MG tablet Take 1 tablet (80 mg total) by mouth 2 (two) times daily. 60 tablet  6  . hydrALAZINE (APRESOLINE) 50 MG tablet Take 75 mg (1.5 tabs) every am, 50 mg midday, and 75 mg at bedtime. Take extra 50 mg for BP > 140. 120 tablet 6  . HYDROcodone-acetaminophen (NORCO/VICODIN) 5-325 MG tablet Take 1 tablet by mouth every 6 (six) hours as needed for severe pain. 15 tablet 0  . ipratropium-albuterol (DUONEB) 0.5-2.5 (3) MG/3ML SOLN Take 3 mLs by nebulization every 6 (six) hours as needed. (Patient taking differently: Take 3 mLs by nebulization every 6 (six) hours as needed (for shortness of breath or wheezing). ) 360 mL 3  . isosorbide mononitrate (IMDUR) 60 MG 24 hr tablet TAKE ONE TABLET BY MOUTH TWICE DAILY (Patient taking differently: Take 60 mg by mouth two to three times a day) 60 tablet 6  . naphazoline (NAPHCON) 0.1 % ophthalmic solution Place 1 drop into both eyes 4 (four) times daily as needed for irritation.    . nitroGLYCERIN (NITROSTAT) 0.4 MG SL tablet PLACE ONE TABLET UNDER THE TONGUE EVERY 5 MINUTES FOR 3 DOSES AS NEEDED FOR CHEST PAIN (Patient taking differently: PLACE 0.4 MG UNDER THE TONGUE EVERY 5 MINUTES FOR 3 DOSES AS NEEDED FOR CHEST PAIN) 25 tablet 12  . Oxycodone HCl 10 MG TABS Take 20 mg by mouth every 8 (eight) hours.     . potassium chloride (KLOR-CON M10) 10 MEQ tablet Take 1 tablet (10 mEq total) by mouth daily. (Patient taking differently: Take 10-20 mEq by mouth daily as needed (for body cramps). ) 30 tablet 6  . predniSONE (DELTASONE)  20 MG tablet Take 1 tablet (20 mg total) by mouth 2 (two) times daily with a meal for 10 days. 20 tablet 0  . sacubitril-valsartan (ENTRESTO) 97-103 MG Take 1 tablet by mouth 2 (two) times daily.    [name] spironolactone (ALDACTONE) 25 MG tablet Take 0.5 tablets (12.5 mg total) by mouth daily. (Patient taking differently: Take 12.5 mg by mouth at bedtime. ) 30 tablet 6   No current facility-administered medications for this encounter.     Allergies  Allergen Reactions  . Contrast Media [Iodinated Diagnostic Agents] Anaphylaxis  . Iohexol Anaphylaxis and Other (See Comments)    PT HAS ANAPHYLAXIS WITH CONTRAST MEDIA!  . Lipitor [Atorvastatin Calcium] Anaphylaxis and Other (See Comments)    Large doses  . Shellfish Allergy Anaphylaxis  . Sulfa Antibiotics Shortness Of Breath and Swelling  . Sulfonamide Derivatives Shortness Of Breath and Swelling  . Metrizamide Swelling  . Latex Rash and Other (See Comments)    With long periods of exposure  . Zocor [Simvastatin] Other (See Comments)    Muscle cramps    Vital Signs: Vitals:   05/29/18 1519  BP: 126/75  Pulse: 76  SpO2: 99%   There were no vitals filed for this visit.  Wt Readings from Last 3 Encounters:  05/21/18 167 lb (75.8 kg)  05/09/18 163 lb 9.6 oz (74.2 kg)  05/01/18 165 lb 9.1 oz (75.1 kg)   Physical Exam: General:  Well appearing. No resp difficulty HEENT: normal Neck: supple. no JVD. Carotids 2+ bilat; no bruits. No lymphadenopathy or thryomegaly appreciated. Cor: PMI nondisplaced. 2/6 AS 2/6MR Lungs: clear Abdomen: soft, nontender, nondistended. No hepatosplenomegaly. No bruits or masses. Good bowel sounds. Extremities: no cyanosis, clubbing, rash, edema Neuro: alert & orientedx3, cranial nerves grossly intact. moves all 4 extremities w/o difficulty. Affect pleasant   ASSESSMENT/Plan  1. Chronic systolic HF: Ischemic cardiomyopathy.  Echo (2/16) with EF 25%. S/p [employer] ICD. Echo 3/18.  EF 20-25%. Echo  04/18/18: EF 20-25%, grade 2 DD - RHC 04/05/18 with well compensated filling pressures with mildly to moderately reduced CO.  - He has seen w Dr. Prescott Gum. Given previous dissection and aortic root replacement likely not a candidate for VAD. - He has been seen [facility] for [facility] eval. May need f/u. He is agreeable to repeat CPX. - Volume status stable.  - Much improved NYHA II. CPX testing reviewed with him personally and very similar to last year except slope mildly elevated and ventilatory threshold decreased. Have given him an exercise plan to follow to boost VT.  - Continue lasix 80 mg BID - Continue Entresto 97/103 bid - Continue carvedilol 25 mg twice a day.  - Continue hydralazine 75 mg bid/ Imdur 60 bid.   - Continue spironolactone 12.5 mg daily.   2. CAD: s/p CABG.   - Recent admit for CP with negative troponin and unchanged EKG. No CP since discharge.  -Cardiolite 11/2015 with evidence for mild reversibility involving the anterolateral wall and a portion of the lateral wall. - We have avoided left heart cath due to dissection and CKD with solitary kidney (lost kidney due to dissection) - Continue Plavix and ASA 81 mg daily.   - Statin intolerance at any dose.  - Continue Zetia. - PCP follows lipids. If LDL remains elevated, will consider referral to Lipid Clinic to consider PCSK-9  3. CKD in setting of solitary kidney s/p dissection - Recent BMET with stable creatinine 1.6  4. HTN:  - Blood pressure well controlled. Continue current regimen. - Stressed importance of not over-treating in setting of solitary kidney. Also stressed not to check BP too frequently and micromanage   5. H/o Type I aortic dissection: Follows with CVTS.  - He has seen w Dr. Prescott Gum. Given previous dissection and aortic root replacement likely not candidate for VAD. - Echo 04/18/18: aortic root, ascending aorta, and aortic arch all mildly dilated  6. NSVT - ICD interrogated personally  today. Stable as above.   7. 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

## 2018-06-03 ENCOUNTER — Other Ambulatory Visit (HOSPITAL_COMMUNITY): Payer: Self-pay | Admitting: Internal Medicine

## 2018-06-05 ENCOUNTER — Ambulatory Visit: Payer: Medicaid Other | Admitting: Internal Medicine

## 2018-06-05 ENCOUNTER — Encounter: Payer: Self-pay | Admitting: Internal Medicine

## 2018-06-05 VITALS — BP 110/68 | HR 72 | Ht 71.0 in | Wt 173.0 lb

## 2018-06-05 DIAGNOSIS — I5022 Chronic systolic (congestive) heart failure: Secondary | ICD-10-CM | POA: Diagnosis not present

## 2018-06-05 DIAGNOSIS — Z95810 Presence of automatic (implantable) cardiac defibrillator: Secondary | ICD-10-CM | POA: Diagnosis not present

## 2018-06-05 DIAGNOSIS — I10 Essential (primary) hypertension: Secondary | ICD-10-CM | POA: Diagnosis not present

## 2018-06-05 NOTE — Patient Instructions (Signed)
Medication Instructions:  Your physician recommends that you continue on your current medications as directed. Please refer to the Current Medication list given to you today.  Labwork: None ordered.  Testing/Procedures: None ordered.  Follow-Up: Your physician wants you to follow-up in: one year with Dr. Lovena Le.   You will receive a reminder letter in the mail two months in advance. If you don't receive a letter, please call our office to schedule the follow-up appointment.  Remote monitoring is used to monitor your ICD from home. This monitoring reduces the number of office visits required to check your device to one time per year. It allows [place] to keep an eye on the functioning of your device to ensure it is working properly. You are scheduled for a device check from home on 08/12/2018. You may send your transmission at any time that day. If you have a wireless device, the transmission will be sent automatically. After your physician reviews your transmission, you will receive a postcard with your next transmission date.  Any Other Special Instructions Will Be Listed Below (If Applicable).  If you need a refill on your cardiac medications before your next appointment, please call your pharmacy.

## 2018-06-05 NOTE — Progress Notes (Addendum)
HPI Chad [name] returns today for ICD followup. He is a pleasant middle aged man with a h/o aortic dissection, ICM, s/p CABG, HTN, ICD insertion with a broken lead, s/p extraction and insertion of a new system in a subpectoral location. The patient was in the hospital with HTN and CHF a few months ago. He is working. He feels tired. No syncope or ICD shock. No edema. Allergies  Allergen Reactions  . Contrast Media [Iodinated Diagnostic Agents] Anaphylaxis  . Iohexol Anaphylaxis and Other (See Comments)    PT HAS ANAPHYLAXIS WITH CONTRAST MEDIA!  . Lipitor [Atorvastatin Calcium] Anaphylaxis and Other (See Comments)    Large doses  . Shellfish Allergy Anaphylaxis  . Sulfa Antibiotics Shortness Of Breath and Swelling  . Sulfonamide Derivatives Shortness Of Breath and Swelling  . Metrizamide Swelling  . Latex Rash and Other (See Comments)    With long periods of exposure  . Zocor [Simvastatin] Other (See Comments)    Muscle cramps     Current Outpatient Medications  Medication Sig Dispense Refill  . albuterol (PROVENTIL HFA;VENTOLIN HFA) 108 (90 Base) MCG/ACT inhaler Inhale 2 puffs into the lungs every 6 (six) hours as needed for wheezing or shortness of breath. 1 Inhaler 0  . aspirin EC 81 MG tablet Take 1 tablet (81 mg total) by mouth daily. Can take 4 tabs (324 mg total) as needed for chest pain. DO NOT TAKE multiple 325 mg tabs. 40 tablet 6  . B Complex-C (SUPER B COMPLEX PO) Take 3 tablets by mouth daily.     . carvedilol (COREG) 25 MG tablet TAKE 1 TABLET BY MOUTH TWICE DAILY WITH  A  MEAL 60 tablet 6  . clopidogrel (PLAVIX) 75 MG tablet TAKE ONE TABLET BY MOUTH DAILY (Patient taking differently: Take 75 mg by mouth once a day) 90 tablet 3  . cyclobenzaprine (FLEXERIL) 10 MG tablet Take 10 mg by mouth daily as needed for muscle spasms.     [name] doxazosin (CARDURA) 1 MG tablet Take 1 tablet (1 mg total) by mouth every 12 (twelve) hours as needed (for systolic BP > 811 mm Hg.).  40 tablet 3  . ENTRESTO 97-103 MG TAKE 1 TABLET BY MOUTH TWICE DAILY 60 tablet 3  . fluticasone (FLONASE) 50 MCG/ACT nasal spray Place 2 sprays into both nostrils daily.     . furosemide (LASIX) 80 MG tablet Take 1 tablet (80 mg total) by mouth 2 (two) times daily. 60 tablet 6  . hydrALAZINE (APRESOLINE) 50 MG tablet Take 75 mg (1.5 tabs) every am, 50 mg midday, and 75 mg at bedtime. Take extra 50 mg for BP > 140. 120 tablet 6  . HYDROcodone-acetaminophen (NORCO/VICODIN) 5-325 MG tablet Take 1 tablet by mouth every 6 (six) hours as needed for severe pain. 15 tablet 0  . ipratropium-albuterol (DUONEB) 0.5-2.5 (3) MG/3ML SOLN Take 3 mLs by nebulization every 6 (six) hours as needed. (Patient taking differently: Take 3 mLs by nebulization every 6 (six) hours as needed (for shortness of breath or wheezing). ) 360 mL 3  . naphazoline (NAPHCON) 0.1 % ophthalmic solution Place 1 drop into both eyes 4 (four) times daily as needed for irritation.    . nitroGLYCERIN (NITROSTAT) 0.4 MG SL tablet PLACE ONE TABLET UNDER THE TONGUE EVERY 5 MINUTES FOR 3 DOSES AS NEEDED FOR CHEST PAIN (Patient taking differently: PLACE 0.4 MG UNDER THE TONGUE EVERY 5 MINUTES FOR 3 DOSES AS NEEDED FOR CHEST PAIN) 25 tablet 12  .  Oxycodone HCl 10 MG TABS Take 20 mg by mouth every 8 (eight) hours.     . potassium chloride (KLOR-CON M10) 10 MEQ tablet Take 1 tablet (10 mEq total) by mouth daily. (Patient taking differently: Take 10-20 mEq by mouth daily as needed (for body cramps). ) 30 tablet 6  . spironolactone (ALDACTONE) 25 MG tablet Take 0.5 tablets (12.5 mg total) by mouth daily. (Patient taking differently: Take 12.5 mg by mouth at bedtime. ) 30 tablet 6  . isosorbide mononitrate (IMDUR) 60 MG 24 hr tablet TAKE ONE TABLET BY MOUTH TWICE DAILY (Patient taking differently: Take 60 mg by mouth two to three times a day) 60 tablet 6   No current facility-administered medications for this visit.      Past Medical History:    Diagnosis Date  . AICD (automatic cardioverter/defibrillator) present   . Anemia   . Anginal pain ([place])   . Aortic dissection, thoracoabdominal ([place])    7/10: Type I s/p repair  . Asthma   . CAD (coronary artery disease)    a. s/p CABG 2006;  b. DES to PDA 2011 (cath: Dx not seen, dRCA/PDA tx with DES; S-PDA occluded (culprit), S-Dx occluded, S-RI and OM ok, L-LAD ok  . Carotid stenosis    dopplers 2011: 0-39% bilat.  . Chest pain syndrome   . CHF (congestive heart failure) ([place])   . Chronic bronchitis ([place])   . Chronic lower back pain   . Chronic systolic heart failure (HCC)    a. 12/13 ECHO: EF 35-40%, sept, apical & posterobasal HK, LV mod dil & sys fx mod reduced, mild AI, MV mild reg, TV mild reg  . Complication of anesthesia    "difficult to wake afterwards a couple of times"  . 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 Dyspnea   . Family history of adverse reaction to anesthesia    "sister hard to wake up"  . GERD (gastroesophageal reflux disease)   . Gout   . Heart murmur   . HLD (hyperlipidemia)   . HTN (hypertension)    severe  . Myocardial infarction [facility])    "many" (11/07/2016)  . PONV (postoperative nausea and vomiting)   . Sleep apnea    "never RX'd mask" (11/07/2016)  . Thyroid cancer ([place])    Hertle Cell    ROS:   All systems reviewed and negative except as noted in the HPI.   Past Surgical History:  Procedure Laterality Date  . BACK SURGERY    . CARDIAC CATHETERIZATION     "several" (11/07/2016)  . CARDIAC DEFIBRILLATOR PLACEMENT  11/2005   [employer]; Chad [name] 05/09/2011  . CORONARY ANGIOPLASTY WITH STENT PLACEMENT     "I've had 1-2 stents" (11/07/2016)  . CORONARY ARTERY BYPASS GRAFT  06/21/2009   "CABG X2"  . CORONARY ARTERY BYPASS GRAFT  06/29/2005   "CABG X7"  . ICD LEAD REMOVAL  11/07/2016  . ICD LEAD REMOVAL N/A 11/07/2016   Procedure: ICD LEAD REMOVAL, INSERTION OF NEW  ICD LEAD;  Surgeon: Chad Lance, MD;  Location: [place];  Service: Cardiovascular;  Laterality: N/A;  Dr. Prescott Gum to backup case  . IMPLANTABLE CARDIOVERTER DEFIBRILLATOR (ICD) GENERATOR CHANGE N/A 12/13/2012   Procedure: ICD GENERATOR CHANGE;  Surgeon: Chad Lance, MD;  Location: [facility] CATH LAB;  Service: Cardiovascular;  Laterality: N/A;  . LUMBAR [place] SURGERY  01/2001    most recent within  5-10 years  . RIGHT HEART CATH N/A 04/05/2018   Procedure: RIGHT HEART CATH;  Surgeon: Jolaine Artist, MD;  Location: [place] CV LAB;  Service: Cardiovascular;  Laterality: N/A;  . SHOULDER ARTHROSCOPY WITH ROTATOR CUFF REPAIR Bilateral   . Status post emergency repair of a type A ascending aortic dissection with a hemiarch reconstruction of the ascending aorta  using a 28-mm Hemashield graft with redo sternotomy and revision of previous bypass grafts in June 2010.    . TESTICLE SURGERY    . THYROIDECTOMY, PARTIAL  06/20/2011  . VASECTOMY       Family History  Problem Relation Age of Onset  . Hypertension Father   . Heart disease Father   . Early death Father   . COPD Father   . Hypertension Mother   . Coronary artery disease Unknown      Social History   Socioeconomic History  . Marital status: Divorced    Spouse name: Not on file  . Number of children: Not on file  . Years of education: Not on file  . Highest education level: Not on file  Occupational History  . Occupation: disabled  Social Needs  . Financial resource strain: Not on file  . Food insecurity:    Worry: Not on file    Inability: Not on file  . Transportation needs:    Medical: Not on file    Non-medical: Not on file  Tobacco Use  . Smoking status: Never Smoker  . Smokeless tobacco: Never Used  Substance and Sexual Activity  . Alcohol use: No  . Drug use: No  . Sexual activity: Yes  Lifestyle  . Physical activity:    Days per week: Not on file    Minutes per session: Not on file  . Stress: Not on  file  Relationships  . Social connections:    Talks on phone: Not on file    Gets together: Not on file    Attends religious service: Not on file    Active member of club or organization: Not on file    Attends meetings of clubs or organizations: Not on file    Relationship status: Not on file  . Intimate partner violence:    Fear of current or ex partner: Not on file    Emotionally abused: Not on file    Physically abused: Not on file    Forced sexual activity: Not on file  Other Topics Concern  . Not on file  Social History Narrative   Engaged and lives with fiancee and 4 kids.    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      BP 110/68   Pulse 72   Ht 5\' 11"  (1.803 m)   Wt 173 lb (78.5 kg)   BMI 24.13 kg/m   Physical Exam:  Well appearing 57 yo man, NAD HEENT: Unremarkable Neck:  6 cm JVD, no thyromegally Lymphatics:  No adenopathy Back:  No CVA tenderness Lungs:  Clear with no wheezes HEART:  Regular rate rhythm, no murmurs, no rubs, no clicks, PMI is enlarged and laterally displaced. Abd:  soft, positive bowel sounds, no organomegally, no rebound, no guarding Ext:  2 plus pulses, no edema, no cyanosis, no clubbing Skin:  No rashes no nodules Neuro:  CN II through XII intact, motor grossly intact  DEVICE  Normal device function.  See PaceArt for details.   Assess/Plan: 1. ICD - his [employer] device is working normally and he  has about 8 years of battery longevity. 2. Chronic systolic heart failure - he is under direction of the CHF clinic. He is on maximal medical therapy. He has some fatigue but notes that he slept poorly last night and had to work today.  3. HTN heart disease - his blood pressure is well controlled today. We will follow. 4. CAD - he denies anginal symptoms. He will continue his current meds. He cannot take a statin.   Mikle Bosworth.D.

## 2018-06-06 ENCOUNTER — Other Ambulatory Visit: Payer: Self-pay | Admitting: Internal Medicine

## 2018-06-14 ENCOUNTER — Other Ambulatory Visit (HOSPITAL_COMMUNITY): Payer: Self-pay | Admitting: Internal Medicine

## 2018-06-17 IMAGING — CR DG CHEST 2V
2 series · 2 of 2 positions shown · non-contrast
Comparison: Radiographs December 15, 2017.

CLINICAL DATA: Shortness of breath.

EXAM:
CHEST  2 VIEW

[chest pa]
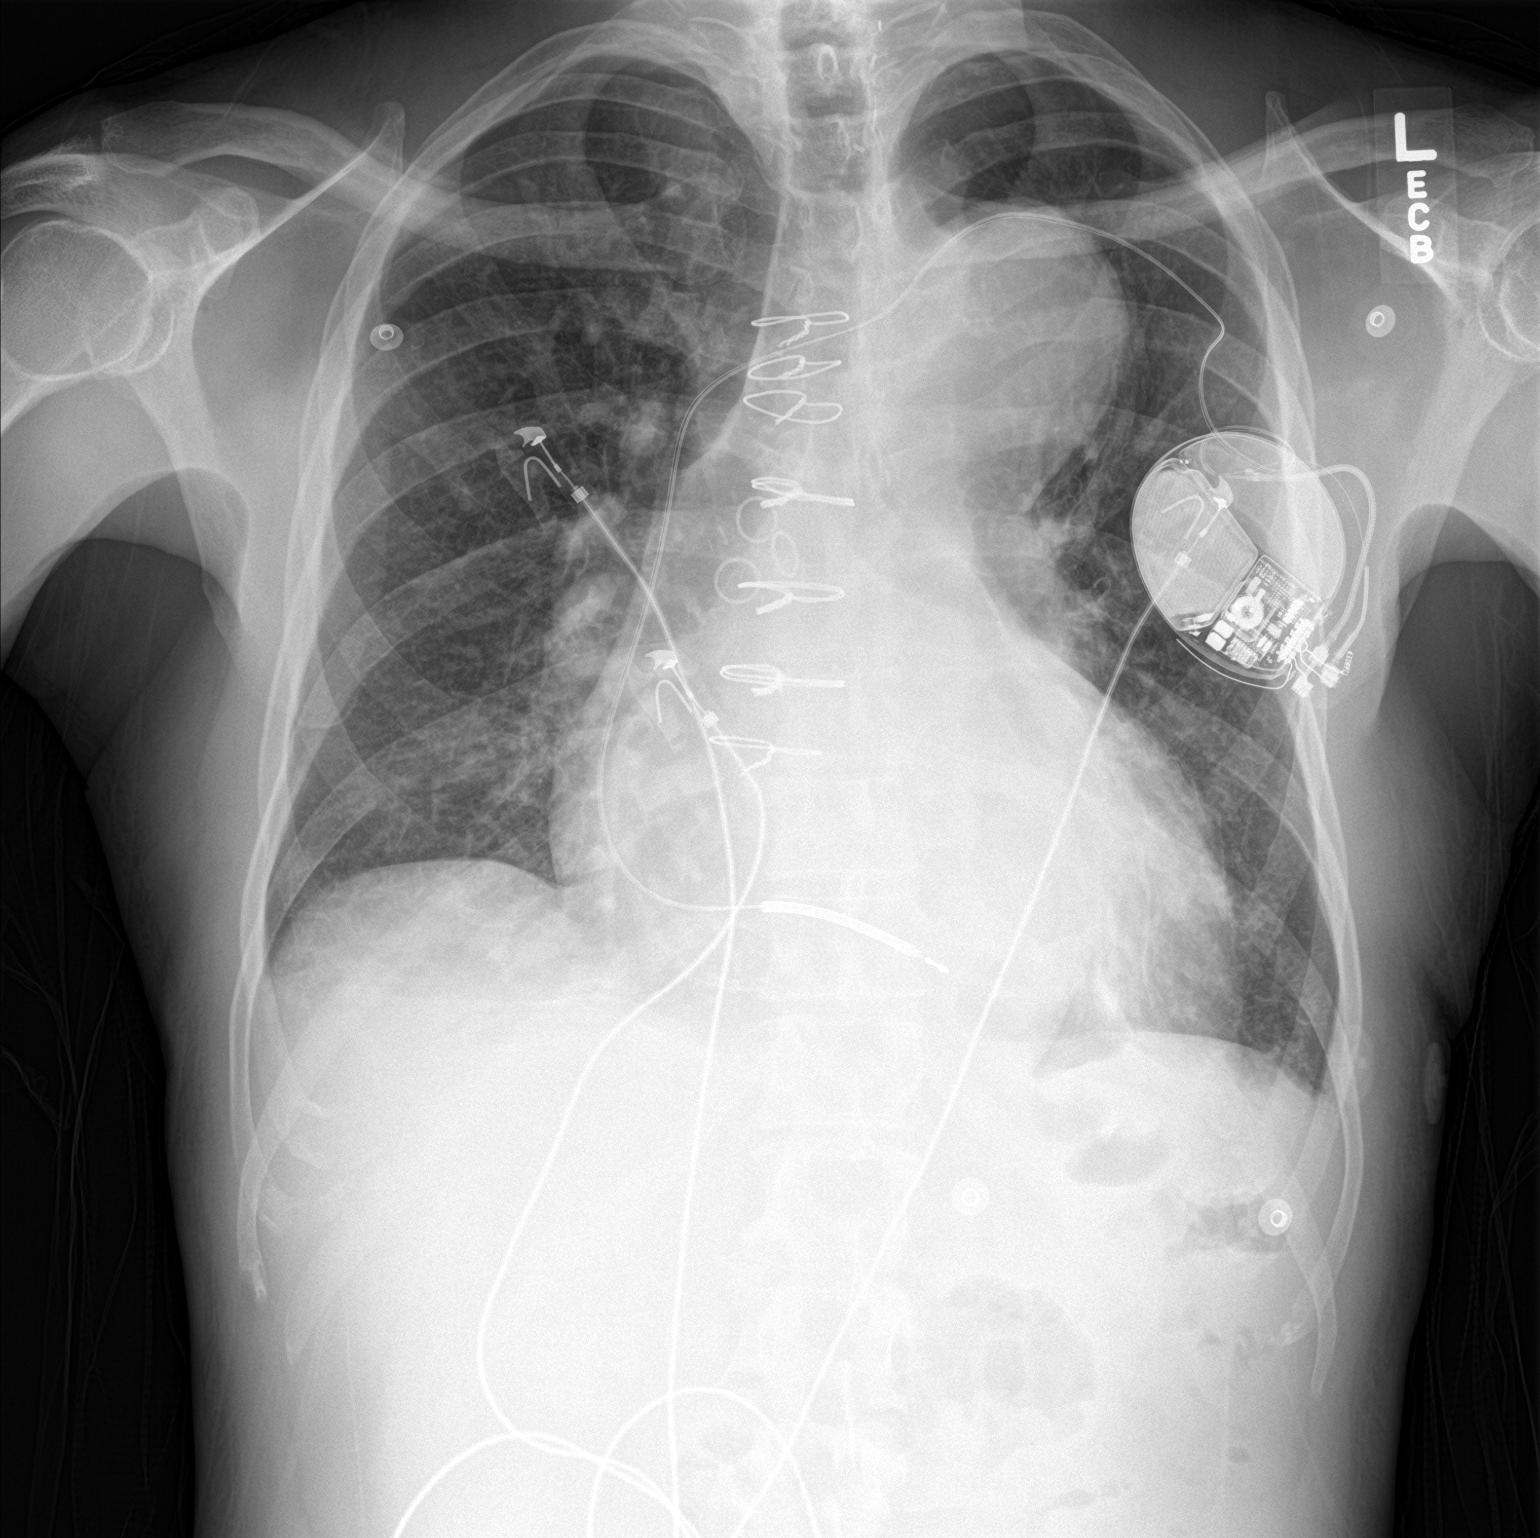

[chest lat]
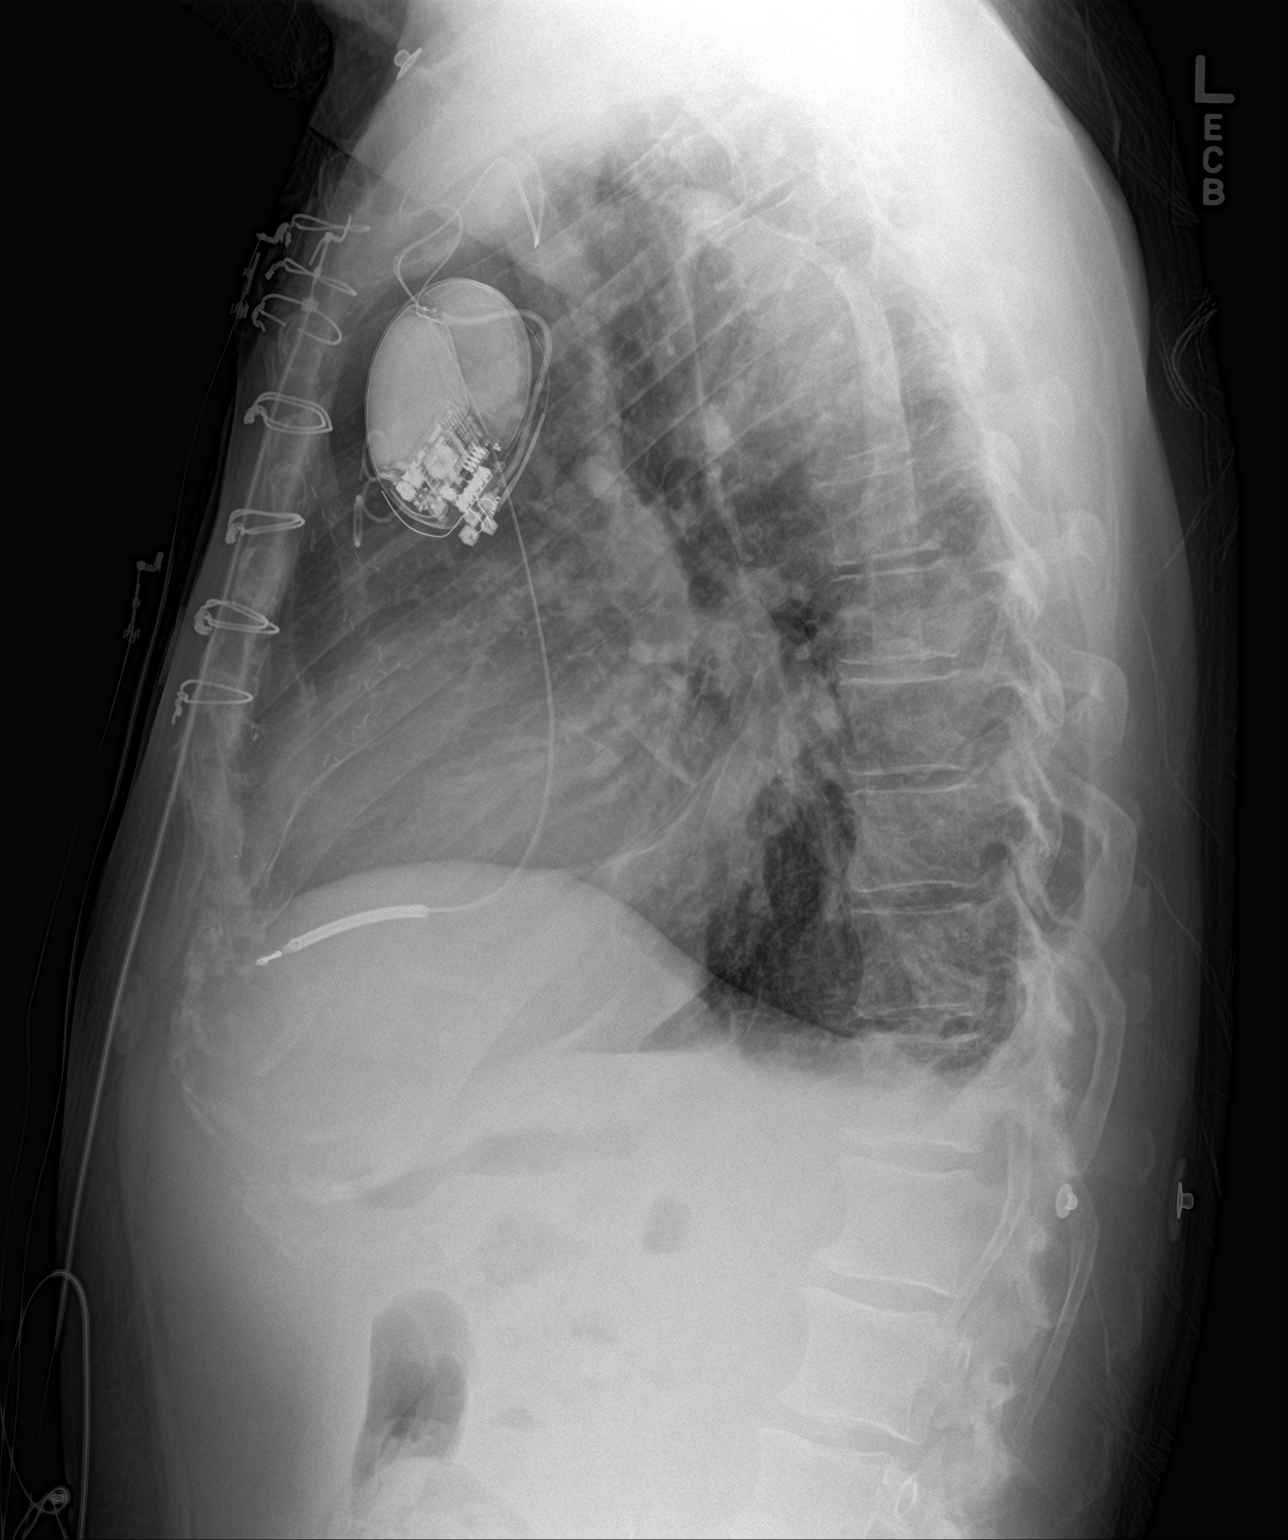

[2 of 2 positions shown; findings below may reference images not displayed]

FINDINGS: Stable cardiomegaly and aneurysmal dilatation of thoracic aorta is
noted. Status post coronary artery bypass graft. Single lead
left-sided pacemaker is unchanged in position. No pneumothorax is
noted. Minimal bilateral pleural effusions may be present. Stable
mild bibasilar subsegmental atelectasis or scarring is noted. Bony
thorax is unremarkable.
IMPRESSION: Stable cardiomegaly and aneurysmal dilatation of thoracic aorta.
Stable mild bibasilar subsegmental atelectasis or scarring. Minimal
bilateral pleural effusions.

## 2018-06-18 ENCOUNTER — Ambulatory Visit (INDEPENDENT_AMBULATORY_CARE_PROVIDER_SITE_OTHER): Payer: Medicaid Other | Admitting: Family Medicine

## 2018-06-18 VITALS — BP 112/70 | HR 67 | Temp 98.6°F | Ht 71.0 in | Wt 170.4 lb

## 2018-06-18 DIAGNOSIS — R2242 Localized swelling, mass and lump, left lower limb: Secondary | ICD-10-CM | POA: Diagnosis not present

## 2018-06-18 NOTE — Progress Notes (Signed)
     Subjective: Chief Complaint  Patient presents with  . gout medicine  . mass on left hip  . right forearm bump     HPI: Chad [name]. is a 57 y.o. presenting to clinic today to discuss the following:  Gout Follow up Patient is not on any prevenative medication for his gout and has had multiple attacks. He states he bought "copper socks" and he has not had an attack since. He states his Medicaid needed prior approval for his allopurinol so he could not pick it up. No pain or swelling today.  Pelvic Mass Patient states he has had a mass in his lower back pelvic area for a long time and he has not mentioned it before because he typically forgets about it. The mass is hard, somewhat mobile, non-tender. He has had no fever, chills, unexplained weight loss, night sweats, or fatigue.  Health Maintenance: none today     ROS noted in HPI.   Past Medical, Surgical, Social, and Family History Reviewed & Updated per EMR.   Pertinent Historical Findings include:   Social History   Tobacco Use  Smoking Status Never Smoker  Smokeless Tobacco Never Used    Objective: BP 112/70 (BP Location: Left Arm, Patient Position: Sitting, Cuff Size: Normal)   Pulse 67   Temp 98.6 F (37 C) (Oral)   Ht 5\' 11"  (1.803 m)   Wt 170 lb 6.4 oz (77.3 kg)   SpO2 98%   BMI 23.77 kg/m  Vitals and nursing notes reviewed  Physical Exam Gen: Alert and Oriented x 3, NAD HEENT: Normocephalic, atraumatic, PERRLA, EOMI CV: RRR, 2/6 systolic murmur, normal S1, S2 split, +2 pulses dorsalis pedis bilaterally Resp: CTAB, no wheezing, rales, or rhonchi, comfortable work of breathing MSK: Moves all four extremities Ext: no clubbing, cyanosis, or edema Skin: warm, dry, intact, no rashes  No results found for this or any previous visit (from the past 72 hour(s)).  Assessment/Plan:  Hip region mass, left Pelvic X-ray to further work up mass.   Mass could be lipoma but it is hard and not soft.  Possibly osteosarcoma vs osteochondroma however it should not be mobile.   MRI if x-ray is inconclusive.  PATIENT EDUCATION PROVIDED: See AVS    Diagnosis and plan along with any newly prescribed medication(s) were discussed in detail with this patient today. The patient verbalized understanding and agreed with the plan. Patient advised if symptoms worsen return to clinic or ER.   Health Maintainance:   Orders Placed This Encounter  Procedures  . DG HIP UNILAT WITH PELVIS 2-3 VIEWS RIGHT    Standing Status:   Future    Standing Expiration Date:   12/18/2018    Order Specific Question:   Reason for Exam (SYMPTOM  OR DIAGNOSIS REQUIRED)    Answer:   left sided posterior mass    Order Specific Question:   Preferred imaging location?    Answer:   [facility]    Order Specific Question:   Radiology Contrast Protocol - do NOT remove file path    Answer:   [email or web address]    No orders of the defined types were placed in this encounter.    Harolyn Rutherford, DO 06/18/2018, 5:06 PM PGY-1, [place]

## 2018-06-18 NOTE — Patient Instructions (Signed)
It was great to see you today! Thank you for letting me participate in your care!  Today, we discussed your recent pain that is likely from gout. You need to be on a controller medication and I will look into why you needed prior approval for Allopurinol.  Also, I will get a pelvis x-ray for the mass located on the posterior hip and call you with results.  Be well, Chad Rutherford, DO PGY-1, [facility]

## 2018-06-18 NOTE — Pre-Procedure Instructions (Signed)
Corbitt Laurice Record.  06/18/2018      [facility] [place] - [address] [place] [address] Phone: [phone] Fax: [place], [place] [address] Phone: [phone] Fax: [phone]  [facility] [address] [place] - [address] [place] [place] [address] Phone: [phone] Fax: [phone]    Your procedure is scheduled on June 25, 2018.  Report to [facility] Admitting at 730 AM.  Call this number if you have problems the morning of surgery:  [phone]   Remember:  Do not eat or drink after midnight.    Take these medicines the morning of surgery with A SIP OF WATER  Albuterol inhaler-if needed Carvedilol (coreg) Cyclobenzaprine (flexeril)-if needed Doxazosin (cardura) -if needed Entresto flonase nasal spray Hydrocodone-acetaminophen (norco) Naphcon eye drops Ipratropium albuterol nebulizer Nitrostat-if needed for chest pain Oxycodone-if needed for pain  Follow your surgeon's instructions on when to hold/resume aspirin and plavix.  7 days prior to surgery STOP taking any Aspirin (unless otherwise instructed by your surgeon), Aleve, Naproxen, Ibuprofen, Motrin, Advil, Goody's, BC's, all herbal medications, fish oil, and all vitamins    Do not wear jewelry, make-up or nail polish.  Do not wear lotions, powders, or perfumes, or deodorant.  Men may shave face and neck.  Do not bring valuables to the hospital.  [facility] is not responsible for any belongings or valuables.  Contacts, dentures or bridgework may not be worn into surgery.  Leave your suitcase in the car.  After surgery it may be brought to your room.  For patients admitted to the hospital, discharge time will be determined by your treatment team.  Patients discharged the day of surgery will not be  allowed to drive home.    [place]- Preparing For Surgery  Before surgery, you can play an important role. Because skin is not sterile, your skin needs to be as free of germs as possible. You can reduce the number of germs on your skin by washing with CHG (chlorahexidine gluconate) Soap before surgery.  CHG is an antiseptic cleaner which kills germs and bonds with the skin to continue killing germs even after washing.    Oral Hygiene is also important to reduce your risk of infection.  Remember - BRUSH YOUR TEETH THE MORNING OF SURGERY WITH YOUR REGULAR TOOTHPASTE  Please do not use if you have an allergy to CHG or antibacterial soaps. If your skin becomes reddened/irritated stop using the CHG.  Do not shave (including legs and underarms) for at least 48 hours prior to first CHG shower. It is OK to shave your face.  Please follow these instructions carefully.   1. Shower the NIGHT BEFORE SURGERY and the MORNING OF SURGERY with CHG.   2. If you chose to wash your hair, wash your hair first as usual with your normal shampoo.  3. After you shampoo, rinse your hair and body thoroughly to remove the shampoo.  4. Use CHG as you would any other liquid soap. You can apply CHG directly to the skin and wash gently with a scrungie or a clean washcloth.   5. Apply the CHG Soap to your body ONLY FROM THE NECK DOWN.  Do not use on open wounds or open sores. Avoid contact with your eyes, ears, mouth and genitals (private parts). Wash Face and genitals (  private parts)  with your normal soap.  6. Wash thoroughly, paying special attention to the area where your surgery will be performed.  7. Thoroughly rinse your body with warm water from the neck down.  8. DO NOT shower/wash with your normal soap after using and rinsing off the CHG Soap.  9. Pat yourself dry with a CLEAN TOWEL.  10. Wear CLEAN PAJAMAS to bed the night before surgery, wear comfortable clothes the morning of surgery  11. Place  CLEAN SHEETS on your bed the night of your first shower and DO NOT SLEEP WITH PETS.  Day of Surgery:  Do not apply any deodorants/lotions.  Please wear clean clothes to the hospital/surgery center.   Remember to brush your teeth WITH YOUR REGULAR TOOTHPASTE.  Please read over the following fact sheets that you were given.

## 2018-06-19 ENCOUNTER — Encounter (HOSPITAL_COMMUNITY): Payer: Self-pay

## 2018-06-19 ENCOUNTER — Other Ambulatory Visit: Payer: Self-pay

## 2018-06-19 ENCOUNTER — Encounter (HOSPITAL_COMMUNITY)
Admission: RE | Admit: 2018-06-19 | Discharge: 2018-06-19 | Disposition: A | Discharge status: Home / Self Care | Payer: Medicaid Other | Source: Ambulatory Visit | Attending: General Surgery | Admitting: General Surgery

## 2018-06-19 DIAGNOSIS — Z7982 Long term (current) use of aspirin: Secondary | ICD-10-CM | POA: Insufficient documentation

## 2018-06-19 DIAGNOSIS — I252 Old myocardial infarction: Secondary | ICD-10-CM | POA: Insufficient documentation

## 2018-06-19 DIAGNOSIS — Z85850 Personal history of malignant neoplasm of thyroid: Secondary | ICD-10-CM | POA: Insufficient documentation

## 2018-06-19 DIAGNOSIS — E785 Hyperlipidemia, unspecified: Secondary | ICD-10-CM | POA: Insufficient documentation

## 2018-06-19 DIAGNOSIS — Z951 Presence of aortocoronary bypass graft: Secondary | ICD-10-CM | POA: Diagnosis not present

## 2018-06-19 DIAGNOSIS — G4733 Obstructive sleep apnea (adult) (pediatric): Secondary | ICD-10-CM | POA: Insufficient documentation

## 2018-06-19 DIAGNOSIS — I255 Ischemic cardiomyopathy: Secondary | ICD-10-CM | POA: Diagnosis not present

## 2018-06-19 DIAGNOSIS — N189 Chronic kidney disease, unspecified: Secondary | ICD-10-CM | POA: Insufficient documentation

## 2018-06-19 DIAGNOSIS — I130 Hypertensive heart and chronic kidney disease with heart failure and stage 1 through stage 4 chronic kidney disease, or unspecified chronic kidney disease: Secondary | ICD-10-CM | POA: Diagnosis not present

## 2018-06-19 DIAGNOSIS — Z01818 Encounter for other preprocedural examination: Secondary | ICD-10-CM | POA: Insufficient documentation

## 2018-06-19 DIAGNOSIS — Z79899 Other long term (current) drug therapy: Secondary | ICD-10-CM | POA: Insufficient documentation

## 2018-06-19 DIAGNOSIS — M109 Gout, unspecified: Secondary | ICD-10-CM | POA: Insufficient documentation

## 2018-06-19 DIAGNOSIS — Z95810 Presence of automatic (implantable) cardiac defibrillator: Secondary | ICD-10-CM | POA: Insufficient documentation

## 2018-06-19 DIAGNOSIS — J45909 Unspecified asthma, uncomplicated: Secondary | ICD-10-CM | POA: Insufficient documentation

## 2018-06-19 DIAGNOSIS — K4020 Bilateral inguinal hernia, without obstruction or gangrene, not specified as recurrent: Secondary | ICD-10-CM | POA: Insufficient documentation

## 2018-06-19 DIAGNOSIS — I5022 Chronic systolic (congestive) heart failure: Secondary | ICD-10-CM | POA: Diagnosis not present

## 2018-06-19 DIAGNOSIS — I2510 Atherosclerotic heart disease of native coronary artery without angina pectoris: Secondary | ICD-10-CM | POA: Diagnosis not present

## 2018-06-19 DIAGNOSIS — K219 Gastro-esophageal reflux disease without esophagitis: Secondary | ICD-10-CM | POA: Diagnosis not present

## 2018-06-19 DIAGNOSIS — Z7902 Long term (current) use of antithrombotics/antiplatelets: Secondary | ICD-10-CM | POA: Insufficient documentation

## 2018-06-19 DIAGNOSIS — Z955 Presence of coronary angioplasty implant and graft: Secondary | ICD-10-CM | POA: Diagnosis not present

## 2018-06-19 HISTORY — DX: Unspecified osteoarthritis, unspecified site: M19.90

## 2018-06-19 LAB — DIFFERENTIAL
Abs Immature Granulocytes: 0 10*3/uL (ref 0.0–0.1)
BASOS ABS: 0 10*3/uL (ref 0.0–0.1)
BASOS PCT: 0 %
EOS PCT: 4 %
Eosinophils Absolute: 0.1 10*3/uL (ref 0.0–0.7)
IMMATURE GRANULOCYTES: 0 %
Lymphocytes Relative: 40 %
Lymphs Abs: 1 10*3/uL (ref 0.7–4.0)
MONO ABS: 0.3 10*3/uL (ref 0.1–1.0)
Monocytes Relative: 11 %
Neutro Abs: 1.1 10*3/uL — ABNORMAL LOW (ref 1.7–7.7)
Neutrophils Relative %: 45 %

## 2018-06-19 LAB — CBC
HCT: 42.5 % (ref 39.0–52.0)
HEMOGLOBIN: 13.1 g/dL (ref 13.0–17.0)
MCH: 25.5 pg — ABNORMAL LOW (ref 26.0–34.0)
MCHC: 30.8 g/dL (ref 30.0–36.0)
MCV: 82.8 fL (ref 78.0–100.0)
Platelets: 149 10*3/uL — ABNORMAL LOW (ref 150–400)
RBC: 5.13 MIL/uL (ref 4.22–5.81)
RDW: 16.6 % — AB (ref 11.5–15.5)
WBC: 2.6 10*3/uL — AB (ref 4.0–10.5)

## 2018-06-19 LAB — BASIC METABOLIC PANEL
ANION GAP: 8 (ref 5–15)
BUN: 20 mg/dL (ref 6–20)
CO2: 30 mmol/L (ref 22–32)
Calcium: 9.1 mg/dL (ref 8.9–10.3)
Chloride: 103 mmol/L (ref 98–111)
Creatinine, Ser: 1.68 mg/dL — ABNORMAL HIGH (ref 0.61–1.24)
GFR calc Af Amer: 51 mL/min — ABNORMAL LOW (ref >60)
GFR, EST NON AFRICAN AMERICAN: 44 mL/min — AB (ref >60)
GLUCOSE: 139 mg/dL — AB (ref 70–99)
POTASSIUM: 4 mmol/L (ref 3.5–5.1)
SODIUM: 141 mmol/L (ref 135–145)

## 2018-06-19 NOTE — Pre-Procedure Instructions (Signed)
Chad [name].  06/19/2018      [facility] [place] - [address] [place] [address] Phone: [phone] Fax: [place], [place] [address] Phone: [phone] Fax: [phone]  [facility] [address] [place] - [address] [place] [place] [address] Phone: [phone] Fax: [phone]    Your procedure is scheduled on June 25, 2018.  Report to [facility] Admitting at 730 AM.  Call this number if you have problems the morning of surgery:  [phone]   Remember:  Do not eat or drink after midnight.    Take these medicines the morning of surgery with A SIP OF WATER  Albuterol inhaler-if needed Carvedilol (coreg) Cyclobenzaprine (flexeril)-if needed Doxazosin (cardura) -if needed Entresto Isosorbide mononitrate Hydralazine flonase nasal spray Hydrocodone-acetaminophen (norco) Naphcon eye drops Ipratropium albuterol nebulizer Nitrostat-if needed for chest pain Oxycodone-if needed for pain  Follow your surgeon's instructions on when to hold/resume aspirin and plavix.  7 days prior to surgery STOP taking any Aspirin (unless otherwise instructed by your surgeon), Aleve, Naproxen, Ibuprofen, Motrin, Advil, Goody's, BC's, all herbal medications, fish oil, and all vitamins    Do not wear jewelry, make-up or nail polish.  Do not wear lotions, powders, or perfumes, or deodorant.  Men may shave face and neck.  Do not bring valuables to the hospital.  [facility] is not responsible for any belongings or valuables.  Contacts, dentures or bridgework may not be worn into surgery.  Leave your suitcase in the car.  After surgery it may be brought to your room.  For patients admitted to the hospital, discharge time will be determined by your treatment team.  Patients  discharged the day of surgery will not be allowed to drive home.    - Preparing For Surgery  Before surgery, you can play an important role. Because skin is not sterile, your skin needs to be as free of germs as possible. You can reduce the number of germs on your skin by washing with CHG (chlorahexidine gluconate) Soap before surgery.  CHG is an antiseptic cleaner which kills germs and bonds with the skin to continue killing germs even after washing.    Oral Hygiene is also important to reduce your risk of infection.  Remember - BRUSH YOUR TEETH THE MORNING OF SURGERY WITH YOUR REGULAR TOOTHPASTE  Please do not use if you have an allergy to CHG or antibacterial soaps. If your skin becomes reddened/irritated stop using the CHG.  Do not shave (including legs and underarms) for at least 48 hours prior to first CHG shower. It is OK to shave your face.  Please follow these instructions carefully.   1. Shower the NIGHT BEFORE SURGERY and the MORNING OF SURGERY with CHG.   2. If you chose to wash your hair, wash your hair first as usual with your normal shampoo.  3. After you shampoo, rinse your hair and body thoroughly to remove the shampoo.  4. Use CHG as you would any other liquid soap. You can apply CHG directly to the skin and wash gently with a scrungie or a clean washcloth.   5. Apply the CHG Soap to your body ONLY FROM THE NECK DOWN.  Do not use on open wounds or open sores. Avoid contact with your eyes, ears, mouth and genitals (private parts). Wash  Face and genitals (private parts)  with your normal soap.  6. Wash thoroughly, paying special attention to the area where your surgery will be performed.  7. Thoroughly rinse your body with warm water from the neck down.  8. DO NOT shower/wash with your normal soap after using and rinsing off the CHG Soap.  9. Pat yourself dry with a CLEAN TOWEL.  10. Wear CLEAN PAJAMAS to bed the night before surgery, wear comfortable  clothes the morning of surgery  11. Place CLEAN SHEETS on your bed the night of your first shower and DO NOT SLEEP WITH PETS.  Day of Surgery:  Do not apply any deodorants/lotions.  Please wear clean clothes to the hospital/surgery center.   Remember to brush your teeth WITH YOUR REGULAR TOOTHPASTE.  Please read over the following fact sheets that you were given.

## 2018-06-19 NOTE — Progress Notes (Addendum)
PCP - Nuala Alpha, DO Cardiologist/Heart Failure: Dr. Haroldine Laws EP: Dr. Crissie Sickles Joey from [facility] noted of pt's surgery date and time; awaiting device instructions.  Chest x-ray - 04/20/18 EKG - 05/01/2018 Stress Test - 05/15/18 ECHO - 04/18/18 Cardiac Cath - 04/05/18  Sleep Study -Positive sleep study, ordered to use a BiPAP at night. Pt is not compliant.    Blood Thinner Instructions: Plavix; holding 6/27. 5 days prior to surgery Aspirin Instructions: Attempted to call surgeons office and Dr. Clayborne Dana office. Awaiting clarification.    Anesthesia review: Yes  Patient denies shortness of breath, fever, cough and chest pain at PAT appointment   Patient verbalized understanding of instructions that were given to them at the PAT appointment. Patient was also instructed that they will need to review over the PAT instructions again at home before surgery.

## 2018-06-19 NOTE — Progress Notes (Addendum)
Anesthesia PAT Evaluation:  Case:  [id number] Date/Time:  06/25/18 0715   Procedures:      LAPAROSCOPIC VS OPEN BILATERAL INGUINAL HERNIA REPAIR (Bilateral )     INSERTION OF MESH (Bilateral )   Anesthesia type:  General   Pre-op diagnosis:  Bilateral inguinal hernia   Location:  [facility] OR ROOM 08 / [place] [facility]   Surgeon:  Ralene Ok, MD      DISCUSSION: Patient is a 57 year old male scheduled for the above procedure.   History includes never smoker, HTN, MI (05/2005, [place]), CAD/MI ([place] 05/2005 [place]; s/p CABG: LIMA-LAD, SVG-DIAG, SVG-OM1-OM2, SVG-acute maginal-PDA1-PDA2 06/29/05; MI 08/12/10, initially refused LHC but s/p DES to distal native RCA 08/15/10 for subacute SVG-PDA occlusion SVG-DIAG), ischemic cardiomyopathy, chronic systolic CHF, NSVT (s/p single lead AICD 11/24/05, 12/13/12; extraction of fracture RV lead with insertion of new [employer] ICD 11/07/16), type 1 ascending aortic dissection (s/p repair 28 mm Hemashield graft 06/21/09), CKD (no nephrologist), thyroid cancer s/p left thyroidectomy, anemia, post-operative N/V, asthma, GERD, severe OSA (03/13/17, non-compliant with BiPAP). He also reported that with some surgeries he has been slow to wake up, but does not think he has ever required re-intubation. - Admission 04/29/18-05/01/18 for chest pain. Troponins negative and EKG was unchanged. Chest pain thought to be related to HTN and possibly anxiety. 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  He has cardiac clearance for this procedure by Dr. Haroldine Laws with permission to hold Plavix (surgeon instructed to hold 5 days). Message left for surgeon to clarify if ASA needed to be held. Patient also to follow-up.  Patient without acute symptoms during his PAT visit. Instructed to communicate with appropriate provider if any new changes, but otherwise anticipate that he can proceed as planned. Reviewed with anesthesiologist Dr. Renold Don.  ICD perioperative device form is pending from EP cardiology.     VS: BP 115/85   Pulse 75   Temp 36.9 C   Resp 20   Ht 5\' 11"  (1.803 m)   Wt 165 lb 8 oz (75.1 kg)   SpO2 98%   BMI 23.08 kg/m  Patient is a pleasant black male in NAD. No conversational dyspnea. Denied chest pain, SOB, edema, or ICD discharges. He is not compliant with BiPAP. He has a left sided ICD. Heart sounds RRR. Prominent heart sounds, but no definite murmur auscultated. Lungs clear. Reports some neck degeneration, but no limitations in ROM.    PROVIDERS: Nuala Alpha, DO is PCP ([place]) - Cristopher Peru, MD is EP cardiologist. Last visit 06/05/18.  [employer] device was working normally. Glori Bickers, MD is HF cardiologist. Last visit 05/29/18. (Has also seen Dorthy Cooler, MD at [facility].) - Ivin Poot, MD is CT surgeon. 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 He was referred to [facility] Transplant team for further evaluation in 05/2017 and felt to be too early for transplant based on CPX.   LABS: Preoperative labs reviewed. Cr 1.68, which is consistent with results from 05/09/18 (Cr range 1.3-1.82). WBC 2.6 (previously ~ 3.0-4.0). NEUT# 1.1, otherwise differential WNL. H/H 13.1/42.5, PLT 149K. (all labs ordered are listed, but only abnormal results are displayed)  Labs Reviewed  BASIC METABOLIC PANEL -  Abnormal; Notable for the following components:      Result Value   Glucose, Bld 139 (*)    Creatinine, Ser 1.68 (*)    GFR calc non Af Amer 44 (*)    GFR calc Af Amer 51 (*)    All other components within normal limits  CBC - Abnormal; Notable for the following components:   WBC 2.6 (*)    MCH 25.5 (*)    RDW 16.6 (*)    Platelets 149 (*)    All  other components within normal limits  DIFFERENTIAL - Abnormal; Notable for the following components:   Neutro Abs 1.1 (*)    All other components within normal limits    IMAGES: 1V CXR 04/30/18: IMPRESSION: Stable cardiomegaly with aneurysmal dilatation of the thoracic aorta as before. No significant change. No active pulmonary disease.  CT TAA angiogram/CTA chest/abd/pelvis 11/12/17 ([place]): 1. Status post ascending aortic interposition graft repair, with dissection which extends from the distal anastomosis through the iliac arteries bilaterally. In comparison to prior, the greatest diameter of the aortic arch has increased, with significantly increased mural thrombus, containing questionable high density. It is unclear if this represents a destabilizing aneurysm. This currently measures up to 66 mm on reformatted images. 2. Similar appearance of left renal atrophy, presumably secondary to malperfusion. 3. Unchanged extent of the dissection.   EKG: 05/01/18: NSR, LAD, incomplete left BBB, non-specific T wave abnormality.    CV: CPX 05/15/18: Conclusion: The interpretation of this test is limited due to submaximal effort during the exercise. Based on available data, exercise testing with gas exchange demonstrates moderate to severe functional impairment with at least a moderate HF limitation. Elevated VE/VCO2 slope and oscillatory ventilation individually predict poor short-term prognosis in heart failure. There is likely a limiting component from chronotropic incompetence (achieved 67% PMHR) with a blunted BP response. Also note the mild restrictive lung patterns. CPX mildly worsened from 03/2017.  (Results reviewed by Dr. Haroldine Laws, "Moderate to severe HF limitation with blunted BP response and oscillatory ventilation suggestive of worse prognosis. Although pVO2 does not meet transplant criteria, given age, worsening functional capacity and high-risk features will refer back  to [facility] to discuss VAD versus heart-kidney transplant options.")   Echo 04/18/18: Study Conclusions - Left ventricle: The cavity size was severely dilated. Wall   thickness was normal. Systolic function was severely reduced. The   estimated ejection fraction was in the range of 20% to 25%.   Diffuse hypokinesis. There is akinesis of the inferolateral   myocardium. Features are consistent with a pseudonormal left   ventricular filling pattern, with concomitant abnormal relaxation   and increased filling pressure (grade 2 diastolic dysfunction).   Doppler parameters are consistent with high ventricular filling   pressure. - Aortic valve: There was mild to moderate regurgitation. - Aortic root: The aortic root was mildly dilated. - Ascending aorta: The ascending aorta was mildly dilated. - Aortic arch: The aortic arch was mildly dilated. - Mitral valve: There was moderate regurgitation. - Left atrium: The atrium was severely dilated. - Right ventricle: The cavity size was moderately dilated. Systolic   function was mildly reduced. - Right atrium: The atrium was mildly dilated. - Tricuspid valve: There was moderate regurgitation. - Pulmonary arteries: Systolic pressure was moderately increased.   PA peak pressure: 54 mm Hg (S). Impressions: - Severe global LV dysfunction; severe LVE; moderate diastolic   dysfunction with elevated LV filling pressure; mild to moderate   AI; mild to  moderate dilatation of aortic root, ascending aorta   and arch (suggest CTA or MRA to further assess); moderate MR;   severe LAE; moderate RVE with mildly reduced function; mild RAE;   moderate TR with moderate pulmonary hypertension.  [place] 04/05/18: Findings: RA = 7 RV = 43/11 PA = 46/22 (31) PCW = 15 Fick cardiac output/index = 4.1/2.1 PVR = 3.9 WU Ao sat = 95% PA sat = 53%, 55% Assessment: 1. Well compensated filling pressures with mildly to moderately reduced CO Plan/Discussion: Continue medical  therapy.   08/15/10 LHC/PCI: Impression: 1. Severe 3V CAD with 3/5 patent grafts. (LIMA-LAD, SVG-[place] INT-OM patent, SVG-PDA occluded, SVG-DIAG occluded) 2. Subacute occlusion of the vein graft to the PDA, which is likely the culprit lesion. 3. PTCA/DES to native distal RCA.   Carotid U/S 02/04/13: Summary: - Technically difficult due to tortuosity and high bifurcation - No significant extracranial carotid artery stenosis demonstrated. Vertebrals are patent with antegrade flow.   Past Medical History:  Diagnosis Date  . AICD (automatic cardioverter/defibrillator) present   . Anemia   . Anginal pain ([place])   . Aortic dissection, thoracoabdominal ([place])    7/10: Type I s/p repair  . Arthritis   . Asthma   . CAD (coronary artery disease)    a. s/p CABG 2006;  b. DES to PDA 2011 (cath: Dx not seen, dRCA/PDA tx with DES; S-PDA occluded (culprit), S-Dx occluded, S-RI and OM ok, L-LAD ok  . Carotid stenosis    dopplers 2011: 0-39% bilat.  . Chest pain syndrome   . CHF (congestive heart failure) ([place])   . Chronic bronchitis ([place])   . Chronic lower back pain   . Chronic systolic heart failure (HCC)    a. 12/13 ECHO: EF 35-40%, sept, apical & posterobasal HK, LV mod dil & sys fx mod reduced, mild AI, MV mild reg, TV mild reg  . Complication of anesthesia    "difficult to wake afterwards a couple of times"  . 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 Dyspnea   . Family history of adverse reaction to anesthesia    "sister hard to wake up"  . GERD (gastroesophageal reflux disease)   . Gout   . Heart murmur   . HLD (hyperlipidemia)   . HTN (hypertension)    severe  . Myocardial infarction [facility])    "many" (11/07/2016)  . PONV (postoperative nausea and vomiting)   . Sleep apnea    "never RX'd mask" (11/07/2016)  . Thyroid cancer ([place])    [employer]    Past Surgical History:  Procedure Laterality Date  . BACK  SURGERY    . CARDIAC CATHETERIZATION     "several" (11/07/2016)  . CARDIAC DEFIBRILLATOR PLACEMENT  11/2005   [employer]; Archie Endo 05/09/2011  . CORONARY ANGIOPLASTY WITH STENT PLACEMENT     "I've had 1-2 stents" (11/07/2016)  . CORONARY ARTERY BYPASS GRAFT  06/21/2009   "CABG X2"  . CORONARY ARTERY BYPASS GRAFT  06/29/2005   "CABG X7"  . ICD LEAD REMOVAL  11/07/2016  . ICD LEAD REMOVAL N/A 11/07/2016   Procedure: ICD LEAD REMOVAL, INSERTION OF NEW ICD LEAD;  Surgeon: Evans Lance, MD;  Location: [facility];  Service: Cardiovascular;  Laterality: N/A;  Dr. Prescott Gum to backup case  . IMPLANTABLE CARDIOVERTER DEFIBRILLATOR (ICD) GENERATOR CHANGE N/A 12/13/2012   Procedure: ICD GENERATOR CHANGE;  Surgeon: Evans Lance, MD;  Location: [facility]  CATH LAB;  Service: Cardiovascular;  Laterality: N/A;  . LUMBAR [place] SURGERY  01/2001    most recent within 5-10 years  . RIGHT HEART CATH N/A 04/05/2018   Procedure: RIGHT HEART CATH;  Surgeon: Jolaine Artist, MD;  Location: [facility] CV LAB;  Service: Cardiovascular;  Laterality: N/A;  . SHOULDER ARTHROSCOPY WITH ROTATOR CUFF REPAIR Bilateral   . Status post emergency repair of a type A ascending aortic dissection with a hemiarch reconstruction of the ascending aorta  using a 28-mm Hemashield graft with redo sternotomy and revision of previous bypass grafts in June 2010.    . TESTICLE SURGERY    . THYROIDECTOMY, PARTIAL  06/20/2011  . TONSILLECTOMY    . VASECTOMY      MEDICATIONS: . albuterol (PROVENTIL HFA;VENTOLIN HFA) 108 (90 Base) MCG/ACT inhaler  . aspirin EC 81 MG tablet  . B Complex-C (SUPER B COMPLEX PO)  . carvedilol (COREG) 25 MG tablet  . clopidogrel (PLAVIX) 75 MG tablet  . cyclobenzaprine (FLEXERIL) 10 MG tablet  . doxazosin (CARDURA) 1 MG tablet  . ENTRESTO 97-103 MG  . fluticasone (FLONASE) 50 MCG/ACT nasal spray  . furosemide (LASIX) 80 MG tablet  . hydrALAZINE (APRESOLINE) 50 MG tablet  . HYDROcodone-acetaminophen  (NORCO/VICODIN) 5-325 MG tablet  . ipratropium-albuterol (DUONEB) 0.5-2.5 (3) MG/3ML SOLN  . isosorbide mononitrate (IMDUR) 60 MG 24 hr tablet  . naphazoline (NAPHCON) 0.1 % ophthalmic solution  . nitroGLYCERIN (NITROSTAT) 0.4 MG SL tablet  . Oxycodone HCl 10 MG TABS  . potassium chloride (KLOR-CON M10) 10 MEQ tablet  . spironolactone (ALDACTONE) 25 MG tablet   No current facility-administered medications for this encounter.     George Hugh [facility] Short Stay Center/Anesthesiology Phone [phone] 06/19/2018 5:49 PM

## 2018-06-24 DIAGNOSIS — R2242 Localized swelling, mass and lump, left lower limb: Secondary | ICD-10-CM | POA: Insufficient documentation

## 2018-06-24 DIAGNOSIS — M542 Cervicalgia: Secondary | ICD-10-CM | POA: Diagnosis not present

## 2018-06-24 DIAGNOSIS — M79662 Pain in left lower leg: Secondary | ICD-10-CM | POA: Diagnosis not present

## 2018-06-24 DIAGNOSIS — G894 Chronic pain syndrome: Secondary | ICD-10-CM | POA: Diagnosis not present

## 2018-06-24 DIAGNOSIS — M79661 Pain in right lower leg: Secondary | ICD-10-CM | POA: Diagnosis not present

## 2018-06-24 DIAGNOSIS — M545 Low back pain: Secondary | ICD-10-CM | POA: Diagnosis not present

## 2018-06-24 NOTE — Assessment & Plan Note (Signed)
Pelvic X-ray to further work up mass.   Mass could be lipoma but it is hard and not soft. Possibly osteosarcoma vs osteochondroma however it should not be mobile.   MRI if x-ray is inconclusive.

## 2018-06-24 NOTE — H&P (Signed)
History of Present Illness  The patient is a 57 year old Chad [name] who presents with an inguinal hernia. Patient follows back up after meeting with Dr. Roni Bread for his evaluation of his hydroceles. The patient continues to have discomfort left side more than right. He does appear to have a small communicating hydrocele the right inguinal canal. This is likely related to the hernia. Patient has seen Dr. Tempie Hoist for cardiac evaluation. We're currently waiting the results of that however the patient does state that he was cleared to hold his Plavix and cleared from a cardiac standpoint.   --------------------------------------------------------- Referred by: Dr. Emmaline Life Chief Complaint: Bilateral inguinal hernias  Patient is a 57 year old Chad [name] with a history of previous CABG, on Plavix, history of hypertension, comes in with bilateral inguinal hernias. Patient's cardiologist is Dr. Anabel Bene Patient was examined approximate years ago and was scheduled for inguinal hernias surgery and surgery was canceled from the OR secondary to no preoperative cardiac clearance/telemonitoring.  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 He states that this receded to his inguinal canal. He states that he is always noticed a bulge to the left inguinal area and now notices a mass in the left scrotum. He states he has pain to the left inguinal area. He also has a right inguinal hernia easily palpable. He does have a testicle in the right scrotum. Patient states she's never had any imaging of the scrotum. He does state that he is able to reduce the mass in the left scrotum, likely be of the inguinal canal.  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    Allergies  Allergies Reconciled   Medication History Entresto (97-103MG  Tablet, Oral) Active. Flexeril (5MG  Tablet, Oral as needed) Active. HydrALAZINE HCl (50MG  Tablet, Oral)  Active. Isosorbide Mononitrate (60MG  Tablet ER, Oral) Active. Spironolactone (25MG  Tablet, Oral) Active. B Complex (Oral) Active. Coreg (25MG  Tablet, Oral) Active. Plavix (75MG  Tablet, Oral) Active. Lasix (40MG  Tablet, Oral) Active. Multiple Vitamin (Oral) Active. Nitrostat (0.4MG  Tab Sublingual, Sublingual) Active. K-Dur [facility] Tablet ER, Oral) Active. OxyCODONE HCl ER (10MG  Tablet ER 12HR, Oral) Active. Medications Reconciled    Review of Systems  All other systems negative    Physical Exam  The physical exam findings are as follows: Note: Constitutional: No acute distress, conversant, appears stated age  Eyes: Anicteric sclerae, moist conjunctiva, no lid lag  Neck: No thyromegaly, trachea midline, no cervical lymphadenopathy  Lungs: Clear to auscultation biilaterally, normal respiratory effot  Cardiovascular: regular rate & rhythm, no murmurs, no peripheal edema, pedal pulses 2+  GI: Soft, no masses or hepatosplenomegaly, non-tender to palpation  MSK: Normal gait, no clubbing cyanosis, edema  Skin: No rashes, palpation reveals normal skin turgor  Psychiatric: Appropriate judgment and insight, oriented to person, place, and time  Abdomen Inspection Hernias - Inguinal hernia - Bilateral - Reducible(Patient has an easily palpable right inguinal hernia Patient has a mass in his left hemiscrotum. This appears to be soft, and this also appears to reduce and she lays down. I believe this could be a remnant from his previous left testicular rupture).    Assessment & Plan  BILATERAL INGUINAL HERNIA WITHOUT OBSTRUCTION OR GANGRENE, RECURRENCE NOT SPECIFIED (K40.20) Impression: 57 year old Chad [name] , with history of CABG, hypertension, on Plavix, with bilateral inguinal hernias  1. The patient will like to proceed to the operating room for laparoscopic bilateral versus open inguinal hernia repair with mesh.  2. I discussed with  the patient the  signs and symptoms of incarceration and strangulation and the need to proceed to the ER should they occur.  3. I discussed with the patient the risks and benefits of the procedure to include but not limited to: Infection, bleeding, damage to surrounding structures, possible need for further surgery, possible nerve pain, and possible recurrence. The patient was understanding and wishes to proceed.

## 2018-06-25 ENCOUNTER — Encounter (HOSPITAL_COMMUNITY): Payer: Self-pay | Admitting: Certified Registered Nurse Anesthetist

## 2018-06-25 ENCOUNTER — Ambulatory Visit (HOSPITAL_COMMUNITY): Payer: Medicaid Other | Admitting: Vascular Surgery

## 2018-06-25 ENCOUNTER — Ambulatory Visit (HOSPITAL_COMMUNITY)
Admission: RE | Admit: 2018-06-25 | Discharge: 2018-06-25 | Disposition: A | Discharge status: Home / Self Care | Payer: Medicaid Other | Source: Ambulatory Visit | Attending: General Surgery | Admitting: General Surgery

## 2018-06-25 ENCOUNTER — Ambulatory Visit (HOSPITAL_COMMUNITY): Payer: Medicaid Other | Admitting: Registered Nurse

## 2018-06-25 ENCOUNTER — Encounter (HOSPITAL_COMMUNITY)
Admission: RE | Disposition: A | Discharge status: Home / Self Care | Payer: Self-pay | Source: Ambulatory Visit | Attending: General Surgery

## 2018-06-25 DIAGNOSIS — K429 Umbilical hernia without obstruction or gangrene: Secondary | ICD-10-CM | POA: Diagnosis not present

## 2018-06-25 DIAGNOSIS — I10 Essential (primary) hypertension: Secondary | ICD-10-CM | POA: Diagnosis not present

## 2018-06-25 DIAGNOSIS — K4090 Unilateral inguinal hernia, without obstruction or gangrene, not specified as recurrent: Secondary | ICD-10-CM | POA: Insufficient documentation

## 2018-06-25 DIAGNOSIS — I25810 Atherosclerosis of coronary artery bypass graft(s) without angina pectoris: Secondary | ICD-10-CM | POA: Diagnosis not present

## 2018-06-25 DIAGNOSIS — K4020 Bilateral inguinal hernia, without obstruction or gangrene, not specified as recurrent: Secondary | ICD-10-CM | POA: Diagnosis not present

## 2018-06-25 DIAGNOSIS — Z951 Presence of aortocoronary bypass graft: Secondary | ICD-10-CM | POA: Diagnosis not present

## 2018-06-25 DIAGNOSIS — Z79899 Other long term (current) drug therapy: Secondary | ICD-10-CM | POA: Insufficient documentation

## 2018-06-25 DIAGNOSIS — K4130 Unilateral femoral hernia, with obstruction, without gangrene, not specified as recurrent: Secondary | ICD-10-CM | POA: Insufficient documentation

## 2018-06-25 DIAGNOSIS — I2510 Atherosclerotic heart disease of native coronary artery without angina pectoris: Secondary | ICD-10-CM | POA: Diagnosis not present

## 2018-06-25 HISTORY — PX: INGUINAL HERNIA REPAIR: SHX194

## 2018-06-25 HISTORY — PX: INSERTION OF MESH: SHX5868

## 2018-06-25 HISTORY — PX: UMBILICAL HERNIA REPAIR: SHX196

## 2018-06-25 SURGERY — REPAIR, HERNIA, INGUINAL, BILATERAL, LAPAROSCOPIC
Anesthesia: General | Site: Groin

## 2018-06-25 MED ORDER — PROPOFOL 10 MG/ML IV BOLUS
INTRAVENOUS | Status: AC
  Filled 2018-06-25: qty 20

## 2018-06-25 MED ORDER — OXYCODONE HCL 5 MG/5ML PO SOLN: 5.0000 mg | Freq: Once | ORAL | Status: AC | PRN

## 2018-06-25 MED ORDER — ONDANSETRON HCL 4 MG/2ML IJ SOLN
INTRAMUSCULAR | Status: DC | PRN
  Administered 2018-06-25: 4 mg via INTRAVENOUS

## 2018-06-25 MED ORDER — LIDOCAINE 2% (20 MG/ML) 5 ML SYRINGE
INTRAMUSCULAR | Status: AC
  Filled 2018-06-25: qty 5

## 2018-06-25 MED ORDER — DEXAMETHASONE SODIUM PHOSPHATE 10 MG/ML IJ SOLN
INTRAMUSCULAR | Status: AC
  Filled 2018-06-25: qty 1

## 2018-06-25 MED ORDER — MIDAZOLAM HCL 5 MG/5ML IJ SOLN
INTRAMUSCULAR | Status: DC | PRN
  Administered 2018-06-25 (×2): 1 mg via INTRAVENOUS

## 2018-06-25 MED ORDER — FENTANYL CITRATE (PF) 100 MCG/2ML IJ SOLN: 25.0000 ug | INTRAMUSCULAR | Status: DC | PRN

## 2018-06-25 MED ORDER — BUPIVACAINE HCL (PF) 0.25 % IJ SOLN
INTRAMUSCULAR | Status: AC
  Filled 2018-06-25: qty 30

## 2018-06-25 MED ORDER — PROMETHAZINE HCL 25 MG/ML IJ SOLN: 6.2500 mg | INTRAMUSCULAR | Status: DC | PRN

## 2018-06-25 MED ORDER — ROCURONIUM BROMIDE 10 MG/ML (PF) SYRINGE
PREFILLED_SYRINGE | INTRAVENOUS | Status: AC
  Filled 2018-06-25: qty 10

## 2018-06-25 MED ORDER — LACTATED RINGERS IV SOLN
INTRAVENOUS | Status: DC | PRN
  Administered 2018-06-25 (×2): via INTRAVENOUS

## 2018-06-25 MED ORDER — EPHEDRINE SULFATE 50 MG/ML IJ SOLN
INTRAMUSCULAR | Status: AC
  Filled 2018-06-25: qty 1

## 2018-06-25 MED ORDER — OXYCODONE HCL 5 MG PO TABS
ORAL_TABLET | ORAL | Status: AC
  Filled 2018-06-25: qty 1

## 2018-06-25 MED ORDER — EPHEDRINE SULFATE 50 MG/ML IJ SOLN
INTRAMUSCULAR | Status: DC | PRN
  Administered 2018-06-25 (×3): 10 mg via INTRAVENOUS

## 2018-06-25 MED ORDER — KETOROLAC TROMETHAMINE 30 MG/ML IJ SOLN
30.0000 mg | Freq: Once | INTRAMUSCULAR | Status: AC | PRN
  Administered 2018-06-25: 30 mg via INTRAVENOUS

## 2018-06-25 MED ORDER — FENTANYL CITRATE (PF) 250 MCG/5ML IJ SOLN
INTRAMUSCULAR | Status: AC
  Filled 2018-06-25: qty 5

## 2018-06-25 MED ORDER — PHENYLEPHRINE 40 MCG/ML (10ML) SYRINGE FOR IV PUSH (FOR BLOOD PRESSURE SUPPORT)
PREFILLED_SYRINGE | INTRAVENOUS | Status: DC | PRN
  Administered 2018-06-25: 40 ug via INTRAVENOUS
  Administered 2018-06-25: 80 ug via INTRAVENOUS

## 2018-06-25 MED ORDER — ROCURONIUM BROMIDE 10 MG/ML (PF) SYRINGE
PREFILLED_SYRINGE | INTRAVENOUS | Status: DC | PRN
  Administered 2018-06-25: 20 mg via INTRAVENOUS
  Administered 2018-06-25: 30 mg via INTRAVENOUS

## 2018-06-25 MED ORDER — PROPOFOL 10 MG/ML IV BOLUS
INTRAVENOUS | Status: DC | PRN
  Administered 2018-06-25: 50 mg via INTRAVENOUS
  Administered 2018-06-25: 40 mg via INTRAVENOUS

## 2018-06-25 MED ORDER — BUPIVACAINE HCL (PF) 0.25 % IJ SOLN
INTRAMUSCULAR | Status: DC | PRN
  Administered 2018-06-25: 6 mL

## 2018-06-25 MED ORDER — TRAMADOL HCL 50 MG PO TABS: 50.0000 mg | ORAL_TABLET | Freq: Four times a day (QID) | ORAL | 0 refills | Status: DC | PRN

## 2018-06-25 MED ORDER — MIDAZOLAM HCL 2 MG/2ML IJ SOLN
INTRAMUSCULAR | Status: AC
  Filled 2018-06-25: qty 2

## 2018-06-25 MED ORDER — KETOROLAC TROMETHAMINE 30 MG/ML IJ SOLN
INTRAMUSCULAR | Status: AC
  Filled 2018-06-25: qty 1

## 2018-06-25 MED ORDER — CHLORHEXIDINE GLUCONATE CLOTH 2 % EX PADS: 6.0000 | MEDICATED_PAD | Freq: Once | CUTANEOUS | Status: DC

## 2018-06-25 MED ORDER — ONDANSETRON HCL 4 MG/2ML IJ SOLN
INTRAMUSCULAR | Status: AC
  Filled 2018-06-25: qty 2

## 2018-06-25 MED ORDER — OXYCODONE HCL 5 MG PO TABS
5.0000 mg | ORAL_TABLET | Freq: Once | ORAL | Status: AC | PRN
  Administered 2018-06-25: 5 mg via ORAL

## 2018-06-25 MED ORDER — SUCCINYLCHOLINE CHLORIDE 200 MG/10ML IV SOSY
PREFILLED_SYRINGE | INTRAVENOUS | Status: AC
  Filled 2018-06-25: qty 10

## 2018-06-25 MED ORDER — PHENYLEPHRINE HCL 10 MG/ML IJ SOLN
INTRAVENOUS | Status: DC | PRN
  Administered 2018-06-25: 25 ug/min via INTRAVENOUS

## 2018-06-25 MED ORDER — GABAPENTIN 300 MG PO CAPS
300.0000 mg | ORAL_CAPSULE | ORAL | Status: AC
  Administered 2018-06-25: 300 mg via ORAL
  Filled 2018-06-25: qty 1

## 2018-06-25 MED ORDER — 0.9 % SODIUM CHLORIDE (POUR BTL) OPTIME
TOPICAL | Status: DC | PRN
  Administered 2018-06-25: 1000 mL

## 2018-06-25 MED ORDER — CELECOXIB 200 MG PO CAPS
200.0000 mg | ORAL_CAPSULE | ORAL | Status: DC
  Filled 2018-06-25: qty 1

## 2018-06-25 MED ORDER — SUCCINYLCHOLINE CHLORIDE 200 MG/10ML IV SOSY
PREFILLED_SYRINGE | INTRAVENOUS | Status: DC | PRN
  Administered 2018-06-25: 100 mg via INTRAVENOUS

## 2018-06-25 MED ORDER — FENTANYL CITRATE (PF) 250 MCG/5ML IJ SOLN
INTRAMUSCULAR | Status: DC | PRN
  Administered 2018-06-25: 50 ug via INTRAVENOUS

## 2018-06-25 MED ORDER — PHENYLEPHRINE 40 MCG/ML (10ML) SYRINGE FOR IV PUSH (FOR BLOOD PRESSURE SUPPORT)
PREFILLED_SYRINGE | INTRAVENOUS | Status: AC
  Filled 2018-06-25: qty 10

## 2018-06-25 MED ORDER — SUGAMMADEX SODIUM 200 MG/2ML IV SOLN
INTRAVENOUS | Status: DC | PRN
  Administered 2018-06-25: 100 mg via INTRAVENOUS
  Administered 2018-06-25: 50 mg via INTRAVENOUS

## 2018-06-25 MED ORDER — ACETAMINOPHEN 500 MG PO TABS
1000.0000 mg | ORAL_TABLET | ORAL | Status: AC
  Administered 2018-06-25: 1000 mg via ORAL
  Filled 2018-06-25: qty 2

## 2018-06-25 MED ORDER — LIDOCAINE 2% (20 MG/ML) 5 ML SYRINGE
INTRAMUSCULAR | Status: DC | PRN
  Administered 2018-06-25: 60 mg via INTRAVENOUS

## 2018-06-25 MED ORDER — SUGAMMADEX SODIUM 200 MG/2ML IV SOLN
INTRAVENOUS | Status: AC
  Filled 2018-06-25: qty 2

## 2018-06-25 MED ORDER — CEFAZOLIN SODIUM-DEXTROSE 2-4 GM/100ML-% IV SOLN
2.0000 g | INTRAVENOUS | Status: AC
  Administered 2018-06-25: 2 g via INTRAVENOUS
  Filled 2018-06-25: qty 100

## 2018-06-25 MED ORDER — DEXAMETHASONE SODIUM PHOSPHATE 10 MG/ML IJ SOLN
INTRAMUSCULAR | Status: DC | PRN
  Administered 2018-06-25: 10 mg via INTRAVENOUS

## 2018-06-25 SURGICAL SUPPLY — 54 items
ADH SKN CLS APL DERMABOND .7 (GAUZE/BANDAGES/DRESSINGS) ×1
APL SKNCLS STERI-STRIP NONHPOA (GAUZE/BANDAGES/DRESSINGS) ×1
APPLIER CLIP LOGIC TI 5 (MISCELLANEOUS) IMPLANT
APR CLP MED LRG 33X5 (MISCELLANEOUS)
BENZOIN TINCTURE PRP APPL 2/3 (GAUZE/BANDAGES/DRESSINGS) ×4 IMPLANT
CANISTER SUCT 3000ML PPV (MISCELLANEOUS) IMPLANT
CHLORAPREP W/TINT 26ML (MISCELLANEOUS) ×4 IMPLANT
CLOSURE WOUND 1/2 X4 (GAUZE/BANDAGES/DRESSINGS)
COVER SURGICAL LIGHT HANDLE (MISCELLANEOUS) ×4 IMPLANT
DEFOGGER SCOPE WARMER CLEARIFY (MISCELLANEOUS) ×4 IMPLANT
DERMABOND ADVANCED (GAUZE/BANDAGES/DRESSINGS) ×2
DERMABOND ADVANCED .7 DNX12 (GAUZE/BANDAGES/DRESSINGS) ×2 IMPLANT
DISSECTOR BLUNT TIP ENDO 5MM (MISCELLANEOUS) IMPLANT
DRAPE LAPAROSCOPIC ABDOMINAL (DRAPES) ×4 IMPLANT
ELECT REM PT RETURN 9FT ADLT (ELECTROSURGICAL) ×4
ELECTRODE REM PT RTRN 9FT ADLT (ELECTROSURGICAL) ×2 IMPLANT
ENDOLOOP SUT PDS II  0 18 (SUTURE) ×2
ENDOLOOP SUT PDS II 0 18 (SUTURE) ×2 IMPLANT
GAUZE SPONGE 2X2 8PLY STRL LF (GAUZE/BANDAGES/DRESSINGS) ×2 IMPLANT
GLOVE BIO SURGEON STRL SZ7.5 (GLOVE) ×4 IMPLANT
GLOVE BIOGEL PI IND STRL 8 (GLOVE) ×2 IMPLANT
GLOVE BIOGEL PI INDICATOR 8 (GLOVE) ×2
GLOVE SURG SS PI 7.0 STRL IVOR (GLOVE) ×4 IMPLANT
GLOVE SURG SS PI 8.0 STRL IVOR (GLOVE) ×4 IMPLANT
GOWN STRL REUS W/ TWL LRG LVL3 (GOWN DISPOSABLE) ×4 IMPLANT
GOWN STRL REUS W/ TWL XL LVL3 (GOWN DISPOSABLE) ×4 IMPLANT
GOWN STRL REUS W/TWL LRG LVL3 (GOWN DISPOSABLE) ×6
GOWN STRL REUS W/TWL XL LVL3 (GOWN DISPOSABLE) ×4
KIT BASIN OR (CUSTOM PROCEDURE TRAY) ×4 IMPLANT
KIT TURNOVER KIT B (KITS) ×4 IMPLANT
MESH 3DMAX 5X7 LT XLRG (Mesh General) ×4 IMPLANT
MESH 3DMAX 5X7 RT XLRG (Mesh General) ×4 IMPLANT
NEEDLE INSUFFLATION 14GA 120MM (NEEDLE) IMPLANT
NS IRRIG 1000ML POUR BTL (IV SOLUTION) ×4 IMPLANT
PAD ARMBOARD 7.5X6 YLW CONV (MISCELLANEOUS) ×8 IMPLANT
RELOAD STAPLE HERNIA 4.0 BLUE (INSTRUMENTS) ×4 IMPLANT
RELOAD STAPLE HERNIA 4.8 BLK (STAPLE) ×8 IMPLANT
SCISSORS LAP 5X35 DISP (ENDOMECHANICALS) ×4 IMPLANT
SET IRRIG TUBING LAPAROSCOPIC (IRRIGATION / IRRIGATOR) IMPLANT
SET TROCAR LAP APPLE-HUNT 5MM (ENDOMECHANICALS) ×4 IMPLANT
SPONGE GAUZE 2X2 STER 10/PKG (GAUZE/BANDAGES/DRESSINGS) ×2
STAPLER HERNIA 12 8.5 360D (INSTRUMENTS) ×4 IMPLANT
STRIP CLOSURE SKIN 1/2X4 (GAUZE/BANDAGES/DRESSINGS) IMPLANT
SUT ETHIBOND 0 MO6 C/R (SUTURE) ×4 IMPLANT
SUT MNCRL AB 4-0 PS2 18 (SUTURE) ×4 IMPLANT
SUT VIC AB 1 CT1 27 (SUTURE)
SUT VIC AB 1 CT1 27XBRD ANBCTR (SUTURE) IMPLANT
TOWEL OR 17X24 6PK STRL BLUE (TOWEL DISPOSABLE) ×4 IMPLANT
TOWEL OR 17X26 10 PK STRL BLUE (TOWEL DISPOSABLE) ×4 IMPLANT
TRAY FOLEY CATH SILVER 16FR (SET/KITS/TRAYS/PACK) ×4 IMPLANT
TRAY LAPAROSCOPIC MC (CUSTOM PROCEDURE TRAY) ×4 IMPLANT
TROCAR XCEL 12X100 BLDLESS (ENDOMECHANICALS) ×4 IMPLANT
TUBING INSUFFLATION (TUBING) ×4 IMPLANT
WATER STERILE IRR 1000ML POUR (IV SOLUTION) ×4 IMPLANT

## 2018-06-25 NOTE — Anesthesia Procedure Notes (Signed)
Arterial Line Insertion Start/End7/01/2018 7:10 AM, 06/25/2018 7:40 AM Performed by: Verdie Drown, CRNA, CRNA  Patient location: Pre-op. Preanesthetic checklist: patient identified, IV checked, site marked, risks and benefits discussed, surgical consent, monitors and equipment checked, pre-op evaluation and anesthesia consent Lidocaine 1% used for infiltration and patient sedated Right, radial was placed Catheter size: 20 G Hand hygiene performed  and maximum sterile barriers used   Attempts: 3 Procedure performed without using ultrasound guided technique. Ultrasound Notes:anatomy identified, needle tip was noted to be adjacent to the nerve/plexus identified and no ultrasound evidence of intravascular and/or intraneural injection Following insertion, dressing applied and Biopatch. Post procedure assessment: normal  Patient tolerated the procedure well with no immediate complications.

## 2018-06-25 NOTE — Discharge Instructions (Signed)
[facility] _______[facility]  UMBILICAL & INGUINAL HERNIA REPAIR: POST OP INSTRUCTIONS  Always review your discharge instruction sheet given to you by the facility where your surgery was performed. IF YOU HAVE DISABILITY OR FAMILY LEAVE FORMS, YOU MUST BRING THEM TO THE OFFICE FOR PROCESSING.   DO NOT GIVE THEM TO YOUR DOCTOR.  1. A  prescription for pain medication may be given to you upon discharge.  Take your pain medication as prescribed, if needed.  If narcotic pain medicine is not needed, then you may take acetaminophen (Tylenol) or ibuprofen (Advil) as needed. 2. Take your usually prescribed medications unless otherwise directed. If you need a refill on your pain medication, please contact your pharmacy.  They will contact our office to request authorization. Prescriptions will not be filled after 5 pm or on week-ends. 3. You should follow a light diet the first 24 hours after arrival home, such as soup and crackers, etc.  Be sure to include lots of fluids daily.  Resume your normal diet the day after surgery. 4.Most patients will experience some swelling and bruising around the umbilicus or in the groin and scrotum.  Ice packs and reclining will help.  Swelling and bruising can take several days to resolve.  6. It is common to experience some constipation if taking pain medication after surgery.  Increasing fluid intake and taking a stool softener (such as Colace) will usually help or prevent this problem from occurring.  A mild laxative (Milk of Magnesia or Miralax) should be taken according to package directions if there are no bowel movements after 48 hours. 7. Unless discharge instructions indicate otherwise, you may remove your bandages 24-48 hours after surgery, and you may shower at that time.  You may have steri-strips (small skin tapes) in place directly over the incision.  These strips should be left on the skin for 7-10 days.  If your surgeon used skin glue on the  incision, you may shower in 24 hours.  The glue will flake off over the next 2-3 weeks.  Any sutures or staples will be removed at the office during your follow-up visit. 8. ACTIVITIES:  You may resume regular (light) daily activities beginning the next day--such as daily self-care, walking, climbing stairs--gradually increasing activities as tolerated.  You may have sexual intercourse when it is comfortable.  Refrain from any heavy lifting or straining until approved by your doctor.  a.You may drive when you are no longer taking prescription pain medication, you can comfortably wear a seatbelt, and you can safely maneuver your car and apply brakes. b.RETURN TO WORK:   _____________________________________________  9.You should see your doctor in the office for a follow-up appointment approximately 2-3 weeks after your surgery.  Make sure that you call for this appointment within a day or two after you arrive home to insure a convenient appointment time. 10.OTHER INSTRUCTIONS: _________________________    _____________________________________  WHEN TO CALL YOUR DOCTOR: 1. Fever over 101.0 2. Inability to urinate 3. Nausea and/or vomiting 4. Extreme swelling or bruising 5. Continued bleeding from incision. 6. Increased pain, redness, or drainage from the incision  The clinic staff is available to answer your questions during regular business hours.  Please dont hesitate to call and ask to speak to one of the nurses for clinical concerns.  If you have a medical emergency, go to the nearest emergency room or call 911.  A surgeon from [facility] is always on call at the hospital  [address], [place], [place], [place]  [address] ?  [address], [place], [place]   [address] [phone] ? [phone] ? FAX [phone] Web site: [email or web address]  Post Anesthesia Home Care Instructions  Activity: Get plenty of rest for the remainder of the day. A  responsible individual must stay with you for 24 hours following the procedure.  For the next 24 hours, DO NOT: -Drive a car -Paediatric nurse -Drink alcoholic beverages -Take any medication unless instructed by your physician -Make any legal decisions or sign important papers.  Meals: Start with liquid foods such as gelatin or soup. Progress to regular foods as tolerated. Avoid greasy, spicy, heavy foods. If nausea and/or vomiting occur, drink only clear liquids until the nausea and/or vomiting subsides. Call your physician if vomiting continues.  Special Instructions/Symptoms: Your throat may feel dry or sore from the anesthesia or the breathing tube placed in your throat during surgery. If this causes discomfort, gargle with warm salt water. The discomfort should disappear within 24 hours.  If you had a scopolamine patch placed behind your ear for the management of post- operative nausea and/or vomiting:  1. The medication in the patch is effective for 72 hours, after which it should be removed.  Wrap patch in a tissue and discard in the trash. Wash hands thoroughly with soap and water. 2. You may remove the patch earlier than 72 hours if you experience unpleasant side effects which may include dry mouth, dizziness or visual disturbances. 3. Avoid touching the patch. Wash your hands with soap and water after contact with the patch.

## 2018-06-25 NOTE — Op Note (Signed)
06/25/2018  9:28 AM  PATIENT:  Chad [name].  57 y.o. male  PRE-OPERATIVE DIAGNOSIS:  Bilateral inguinal hernia Umbilical hernia  POST-OPERATIVE DIAGNOSIS:  Right indirect and incarcerated femoral hernia, Left indirect inguinal hernia, primary 1cm umbilical hernia  PROCEDURE:  Procedure(s): LAPAROSCOPIC  BILATERAL INGUINAL HERNIA REPAIR (Bilateral) INSERTION OF MESH (Bilateral) PRIMARY HERNIA REPAIR UMBILICAL ADULT (N/A)  SURGEON:  Surgeon(s) and Role:    Ralene Ok, MD - Primary  ANESTHESIA:   local and general  EBL:  20 mL   BLOOD ADMINISTERED:none  DRAINS: none   LOCAL MEDICATIONS USED:  BUPIVICAINE   SPECIMEN:  No Specimen  DISPOSITION OF SPECIMEN:  N/A  COUNTS:  YES  TOURNIQUET:  * No tourniquets in log *  DICTATION: .Dragon Dictation  Counts: reported as correct x 2  Findings:  The patient had a right incarcerated femoral and indirect hernia and a left indirect hernia, small 1cm umbilical hernia  Indications for procedure:  The patient is a 57 year old male with a bilateral inguinal and umbilical hernia for several months. Patient complained of symptomatology to his bilateral inguinal areas. The patient was taken back for elective inguinal hernia repair.  Details of the procedure: The patient was taken back to the operating room. The patient was placed in supine position with bilateral SCDs in place.  The patient underwent GETA.  A foley catheter was placed. The patient was prepped and draped in the usual sterile fashion.  After appropriate anitbiotics were confirmed, a time-out was confirmed and all facts were verified.  0.25% Marcaine was used to infiltrate the umbilical area. A 11-blade was used to cut down the skin and blunt dissection was used to get the anterior fashion.  The anterior fascia was incised approximately 1 cm and the muscles were retracted laterally. Blunt dissection was then used to create a space in the preperitoneal area. At this  time a 10 mm camera was then introduced into the space and advanced the pubic tubercle and a 12 mm trocar was placed over this and insufflation was started.  At this time and space was created from medial to laterally the preperitoneal space.  Cooper's ligament was initially cleaned off.  The hernia sac was identified and dissected away from the cremesteric muscle fibers.  The hernia was seen in the indirect space. Dissection of the hernia sac was undertaken the vas deferens was identified and protected in all parts of the case. A small incarcerated femoral hernia was seen in the femoral space.  This was dissected back.  There was only preperitoneal fat and no bowel within the hernia.  Once the hernia sac was taken down to approximately the umbilicus a Bard 3D Max mesh, size: Rachelle Hora, was  introduced into the preperitoneal space.  The mesh was brought over to cover the direct, indirect, and femoral hernia spaces.  This was anchored into place and secured to Cooper's ligament with 4.68mm staples from a [place] hernia stapler. It was anchored to the anterior abdominal wall with 4.8 mm staples. The hernia sac was seen lying posterior to the mesh. There was no staples placed laterally.   The exact same dissection took place on the opposite side. The spermatic cord was identified.  The hernia sac was identified and dissected away from the cremesteric muscle fibers.  The hernia was seen in the indirect space. This appeared to be a large hernia.  Dissection of the hernia sac was undertaken the vas deferens was identified and protected in all parts of  the case.   The insufflation was evacuated and the peritoneum was seen posterior to the mesh bilaterally. The trochars were removed. The anterior fascia was reapproximated using #1 Vicryl on a UR- 6.  Intra-abdominal air was evacuated and the Veress needle removed. The skin was reapproximated using 4-0 Monocryl subcuticular fashion.  At this time the umbilical stalk was  taken off the anterior abdominal wall.  A small 1 cm umbilical hernia was palpated.  At this time the fascia was circumferentially cleaned away from the subcutaneous tissue.  0 Ethibond were then used to reapproximate the fascia in interrupted fashion.  The umbilical stalk was then tacked down to the anterior fascia using a 0 Vicryl x1.  The skin was then reapproximated using 4-0 Monocryl in subcuticular fashion.  The patient was awakened from general anesthesia and taken to recovery in stable condition.  PLAN OF CARE: Discharge to home after PACU  PATIENT DISPOSITION:  PACU - hemodynamically stable.   Delay start of Pharmacological VTE agent (>24hrs) due to surgical blood loss or risk of bleeding: not applicable

## 2018-06-25 NOTE — Anesthesia Preprocedure Evaluation (Addendum)
Anesthesia Evaluation  Patient identified by MRN, date of birth, ID band Patient awake    Reviewed: Allergy & Precautions, NPO status , Patient's Chart, lab work & pertinent test results  Airway Mallampati: II  TM Distance: >3 FB Neck ROM: Full    Dental no notable dental hx. (+) Teeth Intact, Dental Advisory Given   Pulmonary neg pulmonary ROS, sleep apnea ,    Pulmonary exam normal breath sounds clear to auscultation       Cardiovascular hypertension, + CAD, + Past MI, + CABG and +CHF  + Cardiac Defibrillator  Rhythm:Regular Rate:Normal - Systolic murmurs    Neuro/Psych negative neurological ROS  negative psych ROS   GI/Hepatic negative GI ROS, Neg liver ROS,   Endo/Other  negative endocrine ROS  Renal/GU negative Renal ROS  negative genitourinary   Musculoskeletal negative musculoskeletal ROS (+)   Abdominal   Peds negative pediatric ROS (+)  Hematology negative hematology ROS (+)   Anesthesia Other Findings   Reproductive/Obstetrics negative OB ROS                            Anesthesia Physical Anesthesia Plan  ASA: IV  Anesthesia Plan: General   Post-op Pain Management:    Induction: Intravenous  PONV Risk Score and Plan: 2 and Ondansetron, Dexamethasone and Treatment may vary due to age or medical condition  Airway Management Planned: Oral ETT  Additional Equipment: Arterial line  Intra-op Plan:   Post-operative Plan: Extubation in OR  Informed Consent: I have reviewed the patients History and Physical, chart, labs and discussed the procedure including the risks, benefits and alternatives for the proposed anesthesia with the patient or authorized representative who has indicated his/her understanding and acceptance.   Dental advisory given  Plan Discussed with: CRNA and Surgeon  Anesthesia Plan Comments:        Anesthesia Quick Evaluation

## 2018-06-25 NOTE — Anesthesia Procedure Notes (Signed)
Procedure Name: Intubation Date/Time: 06/25/2018 7:57 AM Performed by: Harden Mo, CRNA Pre-anesthesia Checklist: Patient identified, Emergency Drugs available, Suction available and Patient being monitored Patient Re-evaluated:Patient Re-evaluated prior to induction Oxygen Delivery Method: Circle System Utilized Preoxygenation: Pre-oxygenation with 100% oxygen Induction Type: IV induction Ventilation: Mask ventilation without difficulty Laryngoscope Size: Mac and 4 Grade View: Grade I Tube type: Oral Tube size: 7.5 mm Number of attempts: 1 Airway Equipment and Method: Stylet and Oral airway Placement Confirmation: ETT inserted through vocal cords under direct vision,  positive ETCO2 and breath sounds checked- equal and bilateral Secured at: 23 cm Tube secured with: Tape Dental Injury: Teeth and Oropharynx as per pre-operative assessment  Comments: Placed by Katharina Caper under supervision of MD and CRNA

## 2018-06-25 NOTE — Transfer of Care (Signed)
Immediate Anesthesia Transfer of Care Note  Patient: Chad [name].  Procedure(s) Performed: LAPAROSCOPIC  BILATERAL INGUINAL HERNIA REPAIRS (Bilateral Groin) INSERTION OF MESH (Bilateral Groin) UMBILICAL HERNIA REPAIR (N/A Abdomen)  Patient Location: PACU  Anesthesia Type:General  Level of Consciousness: awake and drowsy  Airway & Oxygen Therapy: Patient Spontanous Breathing and Patient connected to nasal cannula oxygen  Post-op Assessment: Report given to RN, Post -op Vital signs reviewed and stable and Patient moving all extremities X 4  Post vital signs: Reviewed and stable  Last Vitals:  Vitals Value Taken Time  BP 122/86 06/25/2018  9:42 AM  Temp    Pulse 76 06/25/2018  9:44 AM  Resp 26 06/25/2018  9:44 AM  SpO2 100 % 06/25/2018  9:44 AM  Vitals shown include unvalidated device data.  Last Pain:  Vitals:   06/25/18 0710  TempSrc:   PainSc: 4       Patients Stated Pain Goal: 4 (03/52/48 1859)  Complications: No apparent anesthesia complications

## 2018-06-25 NOTE — Interval H&P Note (Signed)
History and Physical Interval Note:  06/25/2018 7:19 AM  Chad [name].  has presented today for surgery, with the diagnosis of Bilateral inguinal hernia  The various methods of treatment have been discussed with the patient and family. After consideration of risks, benefits and other options for treatment, the patient has consented to  Procedure(s): LAPAROSCOPIC VS OPEN BILATERAL INGUINAL HERNIA REPAIR (Bilateral) INSERTION OF MESH (Bilateral) and umbilical hernia repair with possible mesh as a surgical intervention .  The patient's history has been reviewed, patient examined, no change in status, stable for surgery.  I have reviewed the patient's chart and labs.  Questions were answered to the patient's satisfaction.     Rosario Jacks., Anne Hahn

## 2018-06-25 NOTE — Anesthesia Postprocedure Evaluation (Signed)
Anesthesia Post Note  Patient: Chad [name].  Procedure(s) Performed: LAPAROSCOPIC  BILATERAL INGUINAL HERNIA REPAIRS (Bilateral Groin) INSERTION OF MESH (Bilateral Groin) UMBILICAL HERNIA REPAIR (N/A Abdomen)     Patient location during evaluation: PACU Anesthesia Type: General Level of consciousness: awake and alert Pain management: pain level controlled Vital Signs Assessment: post-procedure vital signs reviewed and stable Respiratory status: spontaneous breathing, nonlabored ventilation, respiratory function stable and patient connected to nasal cannula oxygen Cardiovascular status: blood pressure returned to baseline and stable Postop Assessment: no apparent nausea or vomiting Anesthetic complications: no    Last Vitals:  Vitals:   06/25/18 1010 06/25/18 1020  BP: 105/74 106/74  Pulse: 76 73  Resp: 20 12  Temp:  36.5 C  SpO2: 95% 95%    Last Pain:  Vitals:   06/25/18 1008  TempSrc:   PainSc: 6                  Melicia Esqueda S

## 2018-06-26 ENCOUNTER — Encounter (HOSPITAL_COMMUNITY): Payer: Self-pay | Admitting: General Surgery

## 2018-07-01 LAB — CUP PACEART INCLINIC DEVICE CHECK
HIGH POWER IMPEDANCE MEASURED VALUE: 34 Ohm
HIGH POWER IMPEDANCE MEASURED VALUE: 56 Ohm
Implantable Lead Implant Date: 20171114
Implantable Lead Model: 181
Implantable Lead Serial Number: 333496
Implantable Pulse Generator Implant Date: 20131220
Lead Channel Impedance Value: 476 Ohm
Lead Channel Pacing Threshold Amplitude: 0.8 V
Lead Channel Pacing Threshold Pulse Width: 0.4 ms
Lead Channel Sensing Intrinsic Amplitude: 20.5 mV
Lead Channel Setting Pacing Amplitude: 3.5 V
Lead Channel Setting Sensing Sensitivity: 0.5 mV
MDC IDC LEAD LOCATION: 753860
MDC IDC SESS DTM: 20190612040000
MDC IDC SET LEADCHNL RV PACING PULSEWIDTH: 0.4 ms
Pulse Gen Serial Number: 124654

## 2018-07-05 ENCOUNTER — Ambulatory Visit (INDEPENDENT_AMBULATORY_CARE_PROVIDER_SITE_OTHER): Payer: Medicaid Other | Admitting: Family Medicine

## 2018-07-05 ENCOUNTER — Other Ambulatory Visit: Payer: Self-pay

## 2018-07-05 VITALS — BP 112/70 | HR 68 | Temp 98.2°F | Ht 71.0 in | Wt 165.0 lb

## 2018-07-05 DIAGNOSIS — M25512 Pain in left shoulder: Secondary | ICD-10-CM | POA: Insufficient documentation

## 2018-07-05 DIAGNOSIS — M1A3711 Chronic gout due to renal impairment, right ankle and foot, with tophus (tophi): Secondary | ICD-10-CM | POA: Diagnosis not present

## 2018-07-05 DIAGNOSIS — M10071 Idiopathic gout, right ankle and foot: Secondary | ICD-10-CM

## 2018-07-05 MED ORDER — METHYLPREDNISOLONE ACETATE 80 MG/ML IJ SUSP
80.0000 mg | Freq: Once | INTRAMUSCULAR | Status: AC
  Administered 2018-07-05: 80 mg via INTRA_ARTICULAR

## 2018-07-05 MED ORDER — COLCHICINE 0.6 MG PO TABS: ORAL_TABLET | ORAL | 0 refills | Status: DC

## 2018-07-05 NOTE — Assessment & Plan Note (Addendum)
Patient's pain in the left shoulder.  History of shoulder trauma.  Patient not interested in trialing the pain medicines joint injection was performed today.  Patient was consented for the procedure.  Most likely diagnosis osteoarthritis of the joint.  Site was sterilized using Betadine.  A milligrams of Depo-Medrol in 3 mL of lidocaine was injected into the anterior glenohumeral joint.  This was achieved using an anterior approach using bony landmarks of clavicle, acromion.  Patient reports relief of pain within 5 minutes.  Patient return.  For next joint injection as needed for pain relief.

## 2018-07-05 NOTE — Progress Notes (Signed)
    Subjective:  Chad Laurice Record. is a 57 y.o. male who presents to the [facility] today with a chief complaint of left shoulder pain.   HPI:  Shoulder pain -  Patient reports pain in left shoulder.  Is been going on for several months.  Is now gotten worse especially when turning, extension,  movement like turning the steering well.  It is relieved with rest and stopping the offending motion.  Patient said that he injured his shoulder many years ago, not exactly sure what type of injury was. He is on chronic opioids Vicodin for chronic pain syndrome.  He said this is not relieve pain.  Patient is not interested in trying another pain medication.  He is not a candidate for NSAIDs due to chronic kidney disease.  Gout of right toe Patient had recent gout attack in his right big toe.  Since gone down after trialing steroids.  He is interested in also having colchicine as an alternative agent if he has another flare.  ROS: Per HPI  PMH: Smoking history reviewed.    Objective:  Physical Exam: BP 112/70   Pulse 68   Temp 98.2 F (36.8 C) (Oral)   Ht 5\' 11"  (1.803 m)   Wt 165 lb (74.8 kg)   SpO2 98%   BMI 23.01 kg/m   Gen: NAD, resting comfortably CV: RRR with no murmurs appreciated Pulm: NWOB, CTAB with no crackles, wheezes, or rhonchi GI: Normal bowel sounds present. Soft, Nontender, Nondistended. MSK: Left shoulder has limited range of motion due to pain, no clicking or popping, range of motion seems intact, no evidence of dislocation or trauma to the joint, no erythema or swelling  No results found for this or any previous visit (from the past 72 hour(s)).   Assessment/Plan:  No problem-specific Assessment & Plan notes found for this encounter.   Lab Orders  No laboratory test(s) ordered today    No orders of the defined types were placed in this encounter.   Marny Lowenstein, MD, [place] - PGY1 07/05/2018 9:28 AM

## 2018-07-05 NOTE — Patient Instructions (Signed)
Shoulder Pain Many things can cause shoulder pain, including:  An injury.  Moving the arm in the same way again and again (overuse).  Joint pain (arthritis).  Follow these instructions at home: Take these actions to help with your pain:  Squeeze a soft ball or a foam pad as much as you can. This helps to prevent swelling. It also makes the arm stronger.  Take over-the-counter and prescription medicines only as told by your doctor.  If told, put ice on the area: ? Put ice in a plastic bag. ? Place a towel between your skin and the bag. ? Leave the ice on for 20 minutes, 2-3 times per day. Stop putting on ice if it does not help with the pain.  If you were given a shoulder sling or immobilizer: ? Wear it as told. ? Remove it to shower or bathe. ? Move your arm as little as possible. ? Keep your hand moving. This helps prevent swelling.  Contact a doctor if:  Your pain gets worse.  Medicine does not help your pain.  You have new pain in your arm, hand, or fingers. Get help right away if:  Your arm, hand, or fingers: ? Tingle. ? Are numb. ? Are swollen. ? Are painful. ? Turn white or blue. This information is not intended to replace advice given to you by your health care provider. Make sure you discuss any questions you have with your health care provider. Document Released: 05/29/2008 Document Revised: 08/06/2016 Document Reviewed: 04/05/2015 Elsevier Interactive Patient Education  2018 [employer]  

## 2018-07-05 NOTE — Assessment & Plan Note (Signed)
No active flare.  Will prescribe colchicine for flare abortion.

## 2018-07-16 ENCOUNTER — Other Ambulatory Visit (HOSPITAL_COMMUNITY): Payer: Self-pay | Admitting: Internal Medicine

## 2018-07-16 DIAGNOSIS — I10 Essential (primary) hypertension: Secondary | ICD-10-CM

## 2018-07-16 DIAGNOSIS — I5043 Acute on chronic combined systolic (congestive) and diastolic (congestive) heart failure: Secondary | ICD-10-CM

## 2018-07-22 DIAGNOSIS — M25551 Pain in right hip: Secondary | ICD-10-CM | POA: Diagnosis not present

## 2018-07-22 DIAGNOSIS — M545 Low back pain: Secondary | ICD-10-CM | POA: Diagnosis not present

## 2018-07-22 DIAGNOSIS — G894 Chronic pain syndrome: Secondary | ICD-10-CM | POA: Diagnosis not present

## 2018-07-22 DIAGNOSIS — M79662 Pain in left lower leg: Secondary | ICD-10-CM | POA: Diagnosis not present

## 2018-07-22 DIAGNOSIS — M79661 Pain in right lower leg: Secondary | ICD-10-CM | POA: Diagnosis not present

## 2018-07-22 DIAGNOSIS — M542 Cervicalgia: Secondary | ICD-10-CM | POA: Diagnosis not present

## 2018-07-22 DIAGNOSIS — M25552 Pain in left hip: Secondary | ICD-10-CM | POA: Diagnosis not present

## 2018-07-29 ENCOUNTER — Encounter (HOSPITAL_COMMUNITY): Payer: Self-pay | Admitting: Internal Medicine

## 2018-07-29 ENCOUNTER — Other Ambulatory Visit: Payer: Self-pay

## 2018-07-29 ENCOUNTER — Ambulatory Visit (HOSPITAL_COMMUNITY)
Admission: RE | Admit: 2018-07-29 | Discharge: 2018-07-29 | Disposition: A | Discharge status: Home / Self Care | Payer: Medicaid Other | Source: Ambulatory Visit | Attending: Internal Medicine | Admitting: Internal Medicine

## 2018-07-29 VITALS — BP 114/70 | HR 66 | Wt 166.5 lb

## 2018-07-29 DIAGNOSIS — E785 Hyperlipidemia, unspecified: Secondary | ICD-10-CM | POA: Insufficient documentation

## 2018-07-29 DIAGNOSIS — I7100 Dissection of unspecified site of aorta: Secondary | ICD-10-CM | POA: Insufficient documentation

## 2018-07-29 DIAGNOSIS — I472 Ventricular tachycardia: Secondary | ICD-10-CM | POA: Diagnosis not present

## 2018-07-29 DIAGNOSIS — I130 Hypertensive heart and chronic kidney disease with heart failure and stage 1 through stage 4 chronic kidney disease, or unspecified chronic kidney disease: Secondary | ICD-10-CM | POA: Diagnosis not present

## 2018-07-29 DIAGNOSIS — Z09 Encounter for follow-up examination after completed treatment for conditions other than malignant neoplasm: Secondary | ICD-10-CM | POA: Insufficient documentation

## 2018-07-29 DIAGNOSIS — G4730 Sleep apnea, unspecified: Secondary | ICD-10-CM | POA: Insufficient documentation

## 2018-07-29 DIAGNOSIS — I5022 Chronic systolic (congestive) heart failure: Secondary | ICD-10-CM | POA: Insufficient documentation

## 2018-07-29 DIAGNOSIS — Z85850 Personal history of malignant neoplasm of thyroid: Secondary | ICD-10-CM | POA: Insufficient documentation

## 2018-07-29 DIAGNOSIS — K219 Gastro-esophageal reflux disease without esophagitis: Secondary | ICD-10-CM | POA: Insufficient documentation

## 2018-07-29 DIAGNOSIS — I255 Ischemic cardiomyopathy: Secondary | ICD-10-CM | POA: Diagnosis not present

## 2018-07-29 DIAGNOSIS — I10 Essential (primary) hypertension: Secondary | ICD-10-CM

## 2018-07-29 DIAGNOSIS — F329 Major depressive disorder, single episode, unspecified: Secondary | ICD-10-CM | POA: Diagnosis not present

## 2018-07-29 DIAGNOSIS — N182 Chronic kidney disease, stage 2 (mild): Secondary | ICD-10-CM

## 2018-07-29 DIAGNOSIS — Z79899 Other long term (current) drug therapy: Secondary | ICD-10-CM | POA: Diagnosis not present

## 2018-07-29 DIAGNOSIS — I2510 Atherosclerotic heart disease of native coronary artery without angina pectoris: Secondary | ICD-10-CM | POA: Diagnosis not present

## 2018-07-29 DIAGNOSIS — F4310 Post-traumatic stress disorder, unspecified: Secondary | ICD-10-CM | POA: Diagnosis not present

## 2018-07-29 DIAGNOSIS — Z882 Allergy status to sulfonamides status: Secondary | ICD-10-CM | POA: Insufficient documentation

## 2018-07-29 DIAGNOSIS — Z951 Presence of aortocoronary bypass graft: Secondary | ICD-10-CM | POA: Insufficient documentation

## 2018-07-29 DIAGNOSIS — J45909 Unspecified asthma, uncomplicated: Secondary | ICD-10-CM | POA: Diagnosis not present

## 2018-07-29 DIAGNOSIS — Z95810 Presence of automatic (implantable) cardiac defibrillator: Secondary | ICD-10-CM | POA: Insufficient documentation

## 2018-07-29 DIAGNOSIS — M109 Gout, unspecified: Secondary | ICD-10-CM | POA: Diagnosis not present

## 2018-07-29 DIAGNOSIS — Z7982 Long term (current) use of aspirin: Secondary | ICD-10-CM | POA: Diagnosis not present

## 2018-07-29 DIAGNOSIS — N189 Chronic kidney disease, unspecified: Secondary | ICD-10-CM | POA: Insufficient documentation

## 2018-07-29 DIAGNOSIS — Z7902 Long term (current) use of antithrombotics/antiplatelets: Secondary | ICD-10-CM | POA: Insufficient documentation

## 2018-07-29 DIAGNOSIS — Z9889 Other specified postprocedural states: Secondary | ICD-10-CM | POA: Diagnosis not present

## 2018-07-29 DIAGNOSIS — I252 Old myocardial infarction: Secondary | ICD-10-CM | POA: Diagnosis not present

## 2018-07-29 LAB — COMPREHENSIVE METABOLIC PANEL
ALBUMIN: 4.2 g/dL (ref 3.5–5.0)
ALT: 14 U/L (ref 0–44)
ANION GAP: 10 (ref 5–15)
AST: 17 U/L (ref 15–41)
Alkaline Phosphatase: 65 U/L (ref 38–126)
BILIRUBIN TOTAL: 1 mg/dL (ref 0.3–1.2)
BUN: 22 mg/dL — ABNORMAL HIGH (ref 6–20)
CHLORIDE: 102 mmol/L (ref 98–111)
CO2: 28 mmol/L (ref 22–32)
Calcium: 9.1 mg/dL (ref 8.9–10.3)
Creatinine, Ser: 1.66 mg/dL — ABNORMAL HIGH (ref 0.61–1.24)
GFR calc Af Amer: 52 mL/min — ABNORMAL LOW (ref >60)
GFR calc non Af Amer: 45 mL/min — ABNORMAL LOW (ref >60)
Glucose, Bld: 97 mg/dL (ref 70–99)
POTASSIUM: 3.8 mmol/L (ref 3.5–5.1)
Sodium: 140 mmol/L (ref 135–145)
TOTAL PROTEIN: 7.6 g/dL (ref 6.5–8.1)

## 2018-07-29 LAB — LIPID PANEL
CHOL/HDL RATIO: 3.6 ratio
CHOLESTEROL: 199 mg/dL (ref 0–200)
HDL: 55 mg/dL (ref >40)
LDL Cholesterol: 131 mg/dL — ABNORMAL HIGH (ref 0–99)
TRIGLYCERIDES: 66 mg/dL (ref <150)
VLDL: 13 mg/dL (ref 0–40)

## 2018-07-29 LAB — CBC
HEMATOCRIT: 42.2 % (ref 39.0–52.0)
Hemoglobin: 13.1 g/dL (ref 13.0–17.0)
MCH: 25.9 pg — ABNORMAL LOW (ref 26.0–34.0)
MCHC: 31 g/dL (ref 30.0–36.0)
MCV: 83.4 fL (ref 78.0–100.0)
Platelets: 136 10*3/uL — ABNORMAL LOW (ref 150–400)
RBC: 5.06 MIL/uL (ref 4.22–5.81)
RDW: 16.6 % — AB (ref 11.5–15.5)
WBC: 3.3 10*3/uL — ABNORMAL LOW (ref 4.0–10.5)

## 2018-07-29 LAB — BRAIN NATRIURETIC PEPTIDE: B Natriuretic Peptide: 1292.6 pg/mL — ABNORMAL HIGH (ref 0.0–100.0)

## 2018-07-29 NOTE — Addendum Note (Signed)
Encounter addended by: Scarlette Calico, RN on: 07/29/2018 3:32 PM  Actions taken: Visit diagnoses modified, Order list changed, Diagnosis association updated, Sign clinical note

## 2018-07-29 NOTE — Progress Notes (Signed)
Advanced Heart Failure Clinic Note   Patient ID: [name], male   DOB: [birth year], 57 y.o.   MRN: [id number]   Primary Cardiologist:  Dr. Glori Bickers  History of Present Illness: Rehan is a 57 y.o. male with history of severe HTN, coronary artery disease status post previous myocardial infarction and bypass surgery in 2006 also DES to native PDA in 2011.  He also has a history congestive heart failure secondary to ischemic cardiomyopathy EF 20-25%   He is s/p single chamber [employer] ICD.  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 Also with significant low back pain s/p 2 surgeries.   He has had episodes of CP but have avoided cath due to previous dissection and solitary kidney.  Myoview 11/2015  1. Evidence for very mild reversibility and ischemia involving the anterolateral wall and a portion of the lateral wall. 2. Large infarct involving the inferior wall. There is dyskinesia in the inferior wall. 3. Left ventricular ejection fraction is 32%. Left ventricle dilatation.  In 11/17 admitted for ICD shocks due to fractured RV lead. Underwent lead extraction and replacement.   We saw him in 2/18 for acute visit due to increasing dyspnea and volume overload. ECHO EF 20-25% moderate RV dysfunction. Moderate AI and severe MR. Ao Root 87mm-> 53mm. In 4/18, he saw Dr. 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 He was referred to [facility] Transplant team for further evaluation in 6/18 and felt to be too early for transplant based on  CPX.  Admitted 5/6-05/01/18 with chest pain. Troponins negative and EKG was unchanged. Chest pain thought to be related to HTN and possibly anxiety. He declined celexa. He was instructed to take extra 50 mg hydralazine for SBP >140 and 1 mg cardura for SBP >160.  Returns for f/u. Had bilateral hernia repair 06/25/18  Says he is feeling great and back to baseline. Going to [employer]. Walking on TM for 45 min several times per week. No CP, orthopnea or PND. Checks BP regularly. Keeps BP 120/80. No edema, orthopnea or PND.    ICD interrogated personally in clinic: No VT/AF. Personally reviewed   CPX 05/15/18 FVC 2.86 (67%)    FEV1 2.26 (68%)     FEV1/FVC 79 (100%)     MVV 106 (70%)    Resting HR: 71 Peak HR: 110  (67% age predicted max HR)  BP rest: 100/64 BP peak: 118/66  Peak VO2: 18.7 (56% predicted peak VO2) VE/VCO2 slope: 34 OUES: 1.45 Peak RER: 1.04 Ventilatory Threshold: 11.8 (35% predicted and 63% measured peak VO2) VE/MVV: 43% O2pulse: 12  (80% predicted O2pulse)  CPX 4/18   FVC 3.11 (73%)    FEV1 2.40 (71%)     FEV1/FVC 77 (97%)     MVV 117 (77%)  Resting HR: 75 Peak HR: 134  (81% age predicted max HR) BP rest: 120/68 BP peak: 146/82 Peak VO2: 18.6 (55% predicted peak VO2) VE/VCO2 slope: 31 OUES: 2.01 Peak RER: 1.05 Ventilatory Threshold: 16.0 (47% predicted or measured peak VO2) VE/MVV: 49% O2pulse: 13  (76% predicted O2pulse)  3/18: ECHO EF 20-25% Moderate AI, moderate to severe MR, moderate TR. Ao Root 50cm 10/22/2015: ECHO EF 20-25% 11/26/12 ECHO EF 35-40%  Review of systems complete and found to be negative unless listed in HPI.   Past Medical History:  Diagnosis Date  . AICD (automatic cardioverter/defibrillator) present   . Anemia   . Anginal pain ([place])   . Aortic dissection, thoracoabdominal ([place])    7/10: Type I s/p repair  . Arthritis   . Asthma   . CAD (coronary artery disease)    a. s/p CABG 2006;  b. DES to  PDA 2011 (cath: Dx not seen, dRCA/PDA tx with DES; S-PDA occluded (culprit), S-Dx occluded, S-RI and OM ok, L-LAD ok  . Carotid stenosis    dopplers 2011: 0-39% bilat.  . Chest pain syndrome   . CHF (congestive heart failure) ([place])   . Chronic bronchitis ([place])   . Chronic lower back pain   . Chronic systolic heart failure (HCC)    a. 12/13 ECHO: EF 35-40%, sept, apical & posterobasal HK, LV mod dil & sys fx mod reduced, mild AI, MV mild reg, TV mild reg  . Complication of anesthesia    "difficult to wake afterwards a couple of times"  . 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 Dyspnea   . Family history of adverse reaction to anesthesia    "sister hard to wake up"  . GERD (gastroesophageal reflux disease)   . Gout   . Heart murmur   . HLD (hyperlipidemia)   . HTN (hypertension)    severe  . Myocardial infarction [facility])    "many" (11/07/2016)  . PONV (postoperative nausea and vomiting)   . Sleep apnea    "never RX'd mask" (11/07/2016)  . Thyroid cancer ([place])    Hertle Cell    Current Outpatient Medications  Medication Sig Dispense Refill  . albuterol (PROVENTIL HFA;VENTOLIN HFA) 108 (90 Base) MCG/ACT inhaler Inhale 2 puffs into the lungs every 6 (six) hours as needed for wheezing or shortness of breath. 1 Inhaler 0  . aspirin EC 81 MG tablet Take 1 tablet (81 mg total) by mouth daily. Can take 4 tabs (324 mg total) as needed for chest pain. DO NOT TAKE multiple 325 mg tabs. 40 tablet 6  . B Complex-C (SUPER B COMPLEX PO) Take 3 tablets by mouth daily.     . carvedilol (COREG) 25 MG tablet TAKE 1 TABLET BY MOUTH TWICE DAILY WITH  A  MEAL 60 tablet 6  . clopidogrel (PLAVIX) 75 MG tablet TAKE ONE TABLET BY MOUTH DAILY 90 tablet 3  . colchicine 0.6 MG tablet Take two pill when you feel an attack coming on. Take 1 pill the following day. 30 tablet 0  . cyclobenzaprine (FLEXERIL) 10 MG tablet Take 10 mg by mouth daily as needed  for muscle spasms.     [name] doxazosin (CARDURA) 1 MG tablet Take 1 tablet (1 mg total) by mouth every 12 (twelve) hours as needed (for systolic BP > 458 mm Hg.). 40 tablet 3  . ENTRESTO 97-103 MG TAKE 1 TABLET BY MOUTH TWICE DAILY 60 tablet 3  . fluticasone (FLONASE) 50 MCG/ACT nasal spray Place 2 sprays into both nostrils daily as needed for allergies or rhinitis.     . furosemide (LASIX) 80 MG tablet Take 1 tablet (80 mg total) by mouth 2 (two) times daily. 60 tablet 6  . hydrALAZINE (APRESOLINE)  50 MG tablet Take 75 mg by mouth 2 (two) times daily.    [name] HYDROcodone-acetaminophen (NORCO/VICODIN) 5-325 MG tablet Take 1 tablet by mouth every 6 (six) hours as needed for severe pain. 15 tablet 0  . ipratropium-albuterol (DUONEB) 0.5-2.5 (3) MG/3ML SOLN Take 3 mLs by nebulization every 6 (six) hours as needed. (Patient taking differently: Take 3 mLs by nebulization every 6 (six) hours as needed (for shortness of breath or wheezing). ) 360 mL 3  . isosorbide mononitrate (IMDUR) 60 MG 24 hr tablet TAKE 1 TABLET BY MOUTH TWICE DAILY 60 tablet 6  . naphazoline (NAPHCON) 0.1 % ophthalmic solution Place 1 drop into both eyes 4 (four) times daily as needed for irritation.    . nitroGLYCERIN (NITROSTAT) 0.4 MG SL tablet PLACE ONE TABLET UNDER THE TONGUE EVERY 5 MINUTES FOR 3 DOSES AS NEEDED FOR CHEST PAIN 25 tablet 12  . Oxycodone HCl 10 MG TABS Take 20 mg by mouth every 8 (eight) hours.     . potassium chloride (KLOR-CON M10) 10 MEQ tablet Take 1 tablet (10 mEq total) by mouth daily. 30 tablet 6  . spironolactone (ALDACTONE) 25 MG tablet TAKE ONE-HALF TABLET BY MOUTH ONCE DAILY 45 tablet 3  . traMADol (ULTRAM) 50 MG tablet Take 1 tablet (50 mg total) by mouth every 6 (six) hours as needed. 20 tablet 0   No current facility-administered medications for this encounter.     Allergies  Allergen Reactions  . Contrast Media [Iodinated Diagnostic Agents] Anaphylaxis  . Iohexol Anaphylaxis and Other (See Comments)     PT HAS ANAPHYLAXIS WITH CONTRAST MEDIA!  . Lipitor [Atorvastatin Calcium] Anaphylaxis and Other (See Comments)    Large doses  . Shellfish Allergy Anaphylaxis  . Sulfa Antibiotics Shortness Of Breath and Swelling  . Sulfonamide Derivatives Shortness Of Breath and Swelling  . Almond Oil Itching    FACIAL/MOUTH ITCHING  . Metrizamide Swelling    SWELLING REACTION UNSPECIFIED   . Zocor [Simvastatin] Other (See Comments)    Muscle cramps  . Latex Rash and Other (See Comments)    With long periods of exposure  . Peach [Prunus Persica] Itching    Vital Signs: Vitals:   07/29/18 1457  BP: 114/70  Pulse: 66  SpO2: 98%  Weight: 166 lb 8 oz (75.5 kg)   Filed Weights   07/29/18 1457  Weight: 166 lb 8 oz (75.5 kg)    Wt Readings from Last 3 Encounters:  07/29/18 166 lb 8 oz (75.5 kg)  07/05/18 165 lb (74.8 kg)  06/19/18 165 lb 8 oz (75.1 kg)   Physical Exam: General:  Well appearing. No resp difficulty HEENT: normal Neck: supple. no JVD. Carotids 2+ bilat; no bruits. No lymphadenopathy or thryomegaly appreciated. Cor: PMI laterally displaced. Regular rate & rhythm. No rubs, gallops or murmurs. Lungs: clear Abdomen: soft, nontender, nondistended. No hepatosplenomegaly. No bruits or masses. Good bowel sounds. Extremities: no cyanosis, clubbing, rash, edema Neuro: alert & orientedx3, cranial nerves grossly intact. moves all 4 extremities w/o difficulty. Affect pleasant   ASSESSMENT/Plan  1. Chronic systolic HF: Ischemic cardiomyopathy.  Echo (2/16) with EF 25%. S/p [employer] ICD. Echo 3/18. EF 20-25%. Echo 04/18/18: EF 20-25%, grade 2 DD - RHC 04/05/18 with well compensated filling pressures with mildly to moderately reduced CO.  - Doing well. Stable NYHA II. CPX testing reviewed with him personally and very similar to last year except slope mildly elevated and ventilatory threshold decreased. Continue current exercise progra,  - He  has seen w Dr. Prescott Gum. Given  previous dissection and aortic root replacement likely not a candidate for VAD here. - He has been seen [facility] at [facility] for transplant eval but CPX too good at this point.  - Volume status stable.  - Continue lasix 80 mg BID - Continue Entresto 97/103 bid - Continue carvedilol 25 mg twice a day.  - Continue hydralazine 75 mg bid/ Imdur 60 bid.   - Continue spironolactone 12.5 mg daily.   2. CAD s/p CABG 2006:  - Recent admit for CP with negative troponin and unchanged EKG. No CP since discharge.  - Cardiolite 11/2015 with evidence for mild reversibility involving the anterolateral wall and a portion of the lateral wall. - We have avoided left heart cath due to dissection and CKD with solitary kidney (lost kidney due to dissection) - Continue Plavix and ASA 81 mg daily.   - Statin intolerance at any dose. Also unable to tolerate Zetia - Last lipids 12/18: TC 150 HDL 43 LDL 95. Will repeat and if LDL > 70 refer to lipid clinic for possible PCSK-9  3. CKD in setting of solitary kidney s/p dissection - Recent BMET with stable creatinine 1.68  4. HTN:  - Blood pressure well controlled. Continue current regimen. - Stressed importance of not over-treating in setting of solitary kidney. Also stressed not to check BP too frequently and micromanage   5. H/o Type I aortic dissection: Follows with CVTS.  - He has seen w Dr. Prescott Gum. Given previous dissection and aortic root replacement likely not candidate for VAD. - Echo 04/18/18: aortic root, ascending aorta, and aortic arch all mildly dilated  6. NSVT - ICD interrogated personally today. No VT/AF.   7. 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

## 2018-07-29 NOTE — Patient Instructions (Signed)
Labs today  Your physician recommends that you schedule a follow-up appointment in: 4 months  

## 2018-08-12 ENCOUNTER — Ambulatory Visit (INDEPENDENT_AMBULATORY_CARE_PROVIDER_SITE_OTHER): Payer: Medicaid Other | Admitting: *Deleted

## 2018-08-12 DIAGNOSIS — I5022 Chronic systolic (congestive) heart failure: Secondary | ICD-10-CM

## 2018-08-13 ENCOUNTER — Ambulatory Visit: Payer: Medicaid Other | Admitting: Family Medicine

## 2018-08-13 NOTE — Progress Notes (Signed)
Remote ICD transmission.   

## 2018-08-15 ENCOUNTER — Other Ambulatory Visit: Payer: Self-pay | Admitting: Internal Medicine

## 2018-08-16 ENCOUNTER — Other Ambulatory Visit (HOSPITAL_COMMUNITY): Payer: Self-pay | Admitting: Internal Medicine

## 2018-08-16 ENCOUNTER — Other Ambulatory Visit: Payer: Self-pay | Admitting: Family Medicine

## 2018-08-16 DIAGNOSIS — M1A3711 Chronic gout due to renal impairment, right ankle and foot, with tophus (tophi): Secondary | ICD-10-CM

## 2018-08-21 DIAGNOSIS — M542 Cervicalgia: Secondary | ICD-10-CM | POA: Diagnosis not present

## 2018-08-21 DIAGNOSIS — G894 Chronic pain syndrome: Secondary | ICD-10-CM | POA: Diagnosis not present

## 2018-08-21 DIAGNOSIS — M79662 Pain in left lower leg: Secondary | ICD-10-CM | POA: Diagnosis not present

## 2018-08-21 DIAGNOSIS — M545 Low back pain: Secondary | ICD-10-CM | POA: Diagnosis not present

## 2018-08-21 DIAGNOSIS — M79661 Pain in right lower leg: Secondary | ICD-10-CM | POA: Diagnosis not present

## 2018-08-30 ENCOUNTER — Ambulatory Visit (INDEPENDENT_AMBULATORY_CARE_PROVIDER_SITE_OTHER): Payer: Medicaid Other | Admitting: Family Medicine

## 2018-08-30 VITALS — BP 110/70 | HR 66 | Temp 98.8°F | Wt 166.2 lb

## 2018-08-30 DIAGNOSIS — M25512 Pain in left shoulder: Secondary | ICD-10-CM

## 2018-08-30 NOTE — Assessment & Plan Note (Signed)
Considering patient has past history of rotator cuff tear, positive painful arc and empty can test I suspect a re-injury to his left rotator cuff, most likely a partial tear. Referral to Orthopedic specialist as he will need to decide his goals in terms of whether or not to pursue surgery versus conservative management and physical therapy.

## 2018-08-30 NOTE — Progress Notes (Signed)
     Subjective: HPI: Chad [name]. is a 57 y.o. presenting to clinic today to discuss the following:  Left Shoulder Follow Up Patient states he thinks he has torn his rotator cuff. He states he has had a previous surgery on his left shoulder for a rotator cuff repair and that is what this feels like. He does not remember any acute event, pop, or intense episode of pain. He states he noticed it was weak after working and has been "babying it ever since". He states this occurred about 5 days ago.  Health Maintenance: none     ROS noted in HPI.   Past Medical, Surgical, Social, and Family History Reviewed & Updated per EMR.   Pertinent Historical Findings include:   Social History   Tobacco Use  Smoking Status Never Smoker  Smokeless Tobacco Never Used    Objective: BP 110/70 (BP Location: Right Arm, Patient Position: Sitting, Cuff Size: Normal)   Pulse 66   Temp 98.8 F (37.1 C) (Oral)   Wt 166 lb 3.2 oz (75.4 kg)   SpO2 98%   BMI 23.18 kg/m  Vitals and nursing notes reviewed  Physical Exam Gen: Alert and Oriented x 3, NAD HEENT: Normocephalic, atraumatic MSK: Moves all four extremities; Left Shoulder: FROM in abduction, flexionnegative Hawkins test, positive empty can test, negative drop test, positive painful arc test Ext: no clubbing, cyanosis, or edema Neuro: No gross deficits Skin: warm, dry, intact, no rashes  No results found for this or any previous visit (from the past 72 hour(s)).  Assessment/Plan:  Pain in joint of left shoulder Considering patient has past history of rotator cuff tear, positive painful arc and empty can test I suspect a re-injury to his left rotator cuff, most likely a partial tear. Referral to Orthopedic specialist as he will need to decide his goals in terms of whether or not to pursue surgery versus conservative management and physical therapy.   PATIENT EDUCATION PROVIDED: See AVS    Diagnosis and plan along with any newly  prescribed medication(s) were discussed in detail with this patient today. The patient verbalized understanding and agreed with the plan. Patient advised if symptoms worsen return to clinic or ER.   Health Maintainance:   No orders of the defined types were placed in this encounter.   No orders of the defined types were placed in this encounter.    Harolyn Rutherford, DO 08/30/2018, 2:43 PM PGY-2 [place]

## 2018-08-30 NOTE — Patient Instructions (Signed)
It was great to see you today! Thank you for letting me participate in your care!  Today, we discussed your left hip mass and please remember to go to the [facility] Radiology department and get an x-ray of your hip.  For your left shoulder I do suspect an acute rotator cuff tear. Please make sure you are seen by an orthopedic specialist for further evaluation and discussion about surgery to weigh the risks and benefits.   Rotator Cuff Injury Rotator cuff injury is any type of injury to the set of muscles and tendons that make up the stabilizing unit of your shoulder. This unit holds the ball of your upper arm bone (humerus) in the socket of your shoulder blade (scapula). What are the causes? Injuries to your rotator cuff most commonly come from sports or activities that cause your arm to be moved repeatedly over your head. Examples of this include throwing, weight lifting, swimming, or racquet sports. Long lasting (chronic) irritation of your rotator cuff can cause soreness and swelling (inflammation), bursitis, and eventual damage to your tendons, such as a tear (rupture). What are the signs or symptoms? Acute rotator cuff tear:  Sudden tearing sensation followed by severe pain shooting from your upper shoulder down your arm toward your elbow.  Decreased range of motion of your shoulder because of pain and muscle spasm.  Severe pain.  Inability to raise your arm out to the side because of pain and loss of muscle power (large tears).  Chronic rotator cuff tear:  Pain that usually is worse at night and may interfere with sleep.  Gradual weakness and decreased shoulder motion as the pain worsens.  Decreased range of motion.  Rotator cuff tendinitis:  Deep ache in your shoulder and the outside upper arm over your shoulder.  Pain that comes on gradually and becomes worse when lifting your arm to the side or turning it inward.  How is this diagnosed? Rotator cuff injury is diagnosed  through a medical history, physical exam, and imaging exam. The medical history helps determine the type of rotator cuff injury. Your health care provider will look at your injured shoulder, feel the injured area, and ask you to move your shoulder in different positions. X-ray exams typically are done to rule out other causes of shoulder pain, such as fractures. MRI is the exam of choice for the most severe shoulder injuries because the images show muscles and tendons. How is this treated? Chronic tear:  Medicine for pain, such as acetaminophen or ibuprofen.  Physical therapy and range-of-motion exercises may be helpful in maintaining shoulder function and strength.  Steroid injections into your shoulder joint.  Surgical repair of the rotator cuff if the injury does not heal with noninvasive treatment.  Acute tear:  Anti-inflammatory medicines such as ibuprofen and naproxen to help reduce pain and swelling.  A sling to help support your arm and rest your rotator cuff muscles. Long-term use of a sling is not advised. It may cause significant stiffening of the shoulder joint.  Surgery may be considered within a few weeks, especially in younger, active people, to return the shoulder to full function.  Indications for surgical treatment include the following: ? Age younger than 48 years. ? Rotator cuff tears that are complete. ? Physical therapy, rest, and anti-inflammatory medicines have been used for 6-8 weeks, with no improvement. ? Employment or sporting activity that requires constant shoulder use.  Tendinitis:  Anti-inflammatory medicines such as ibuprofen and naproxen to help reduce pain  and swelling.  A sling to help support your arm and rest your rotator cuff muscles. Long-term use of a sling is not advised. It may cause significant stiffening of the shoulder joint.  Severe tendinitis may require: ? Steroid injections into your shoulder joint. ? Physical  therapy. ? Surgery.  Follow these instructions at home:  Apply ice to your injury: ? Put ice in a plastic bag. ? Place a towel between your skin and the bag. ? Leave the ice on for 20 minutes, 2-3 times a day.  If you have a shoulder immobilizer (sling and straps), wear it until told otherwise by your health care provider.  You may want to sleep on several pillows or in a recliner at night to lessen swelling and pain.  Only take over-the-counter or prescription medicines for pain, discomfort, or fever as directed by your health care provider.  Do simple hand squeezing exercises with a soft rubber ball to decrease hand swelling. Contact a health care provider if:  Your shoulder pain increases, or new pain or numbness develops in your arm, hand, or fingers.  Your hand or fingers are colder than your other hand. Get help right away if:  Your arm, hand, or fingers are numb or tingling.  Your arm, hand, or fingers are increasingly swollen and painful, or they turn white or blue. This information is not intended to replace advice given to you by your health care provider. Make sure you discuss any questions you have with your health care provider. Document Released: 12/08/2000 Document Revised: 05/18/2016 Document Reviewed: 07/23/2013 Elsevier Interactive Patient Education  2018 [place] well, Harolyn Rutherford, DO PGY-2, [facility]

## 2018-09-07 IMAGING — CR DG CHEST 2V
2 series · 2 of 2 positions shown · non-contrast
Comparison: 01/28/2018

CLINICAL DATA: Chest pain and hypertension.

EXAM:
CHEST - 2 VIEW

[w chest pa]
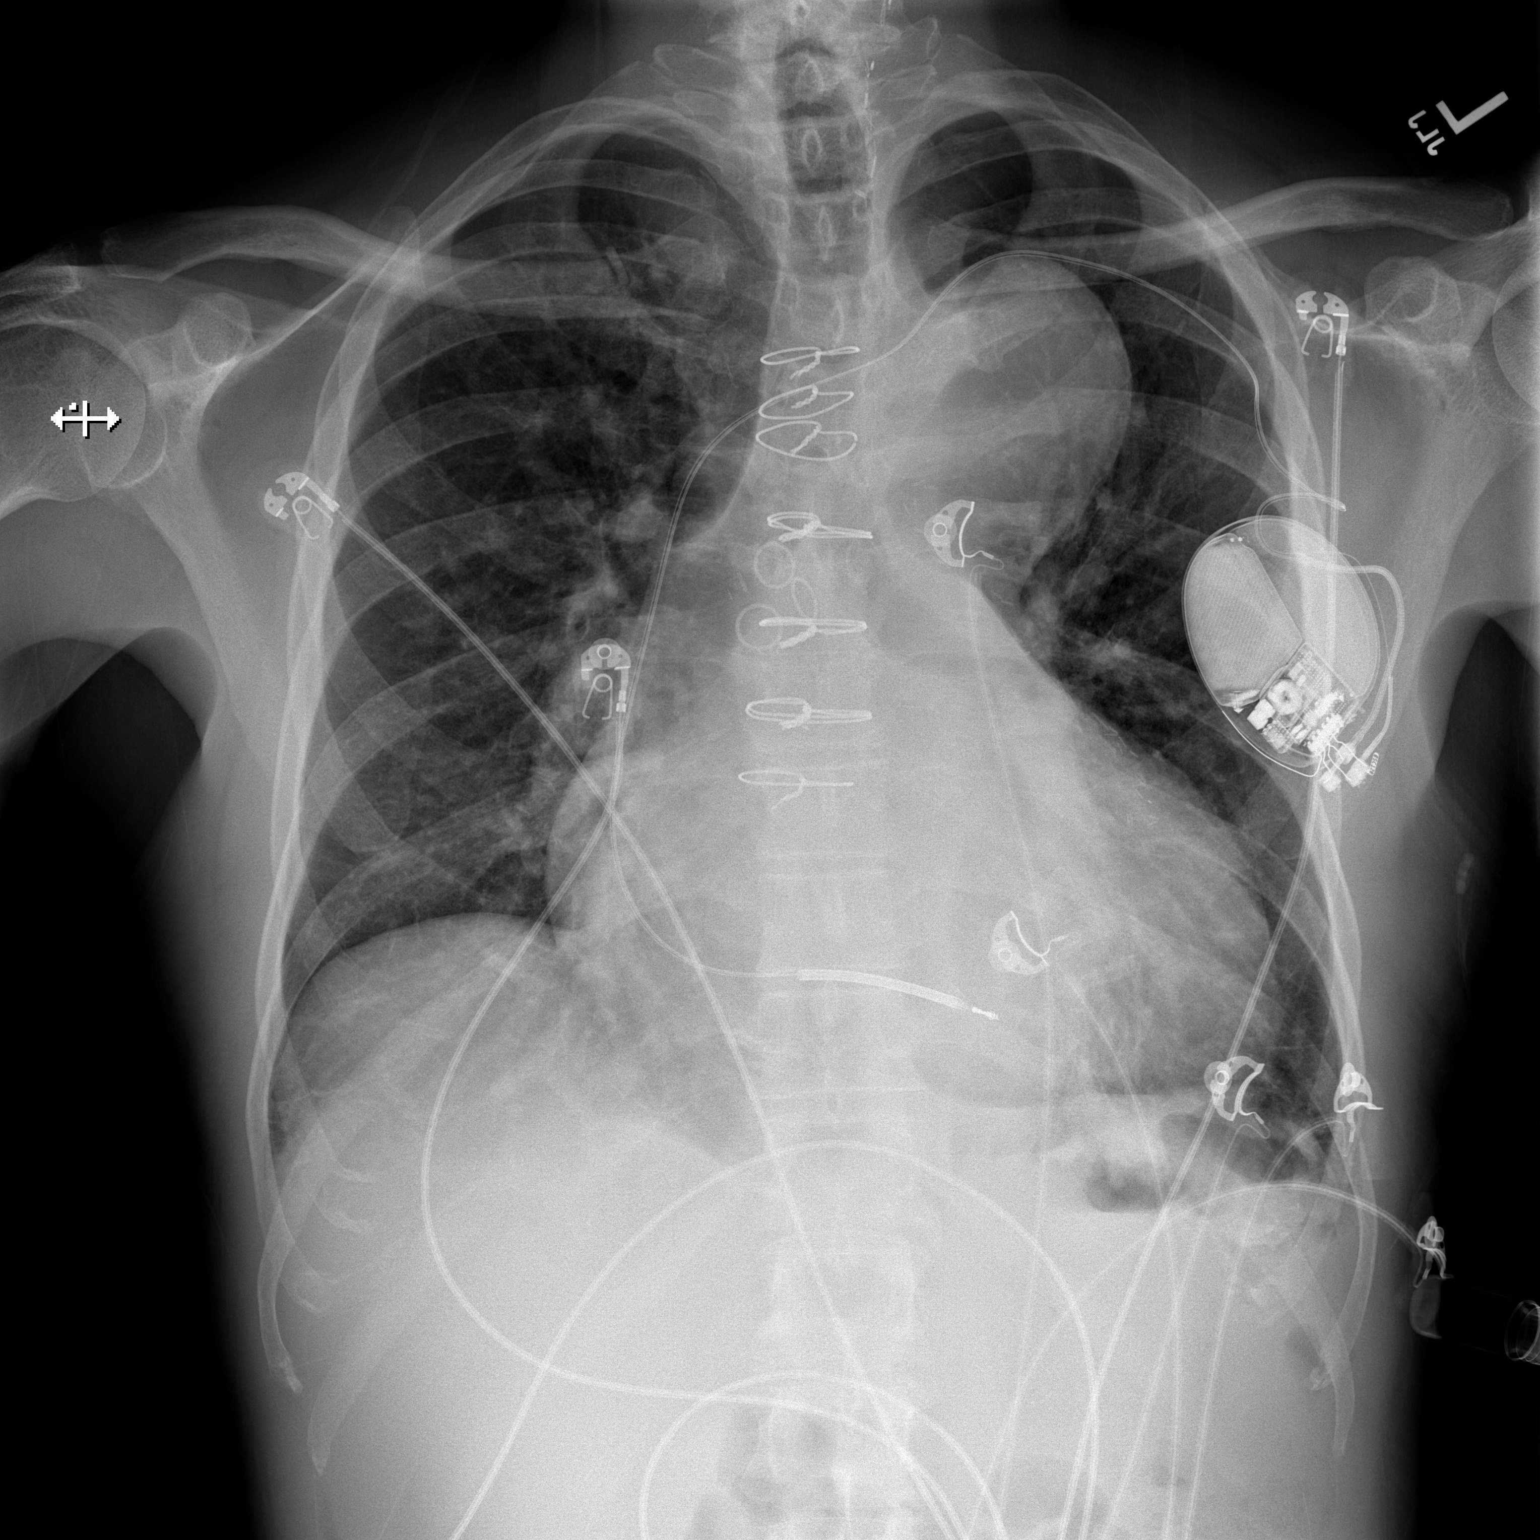

[w chest lat]
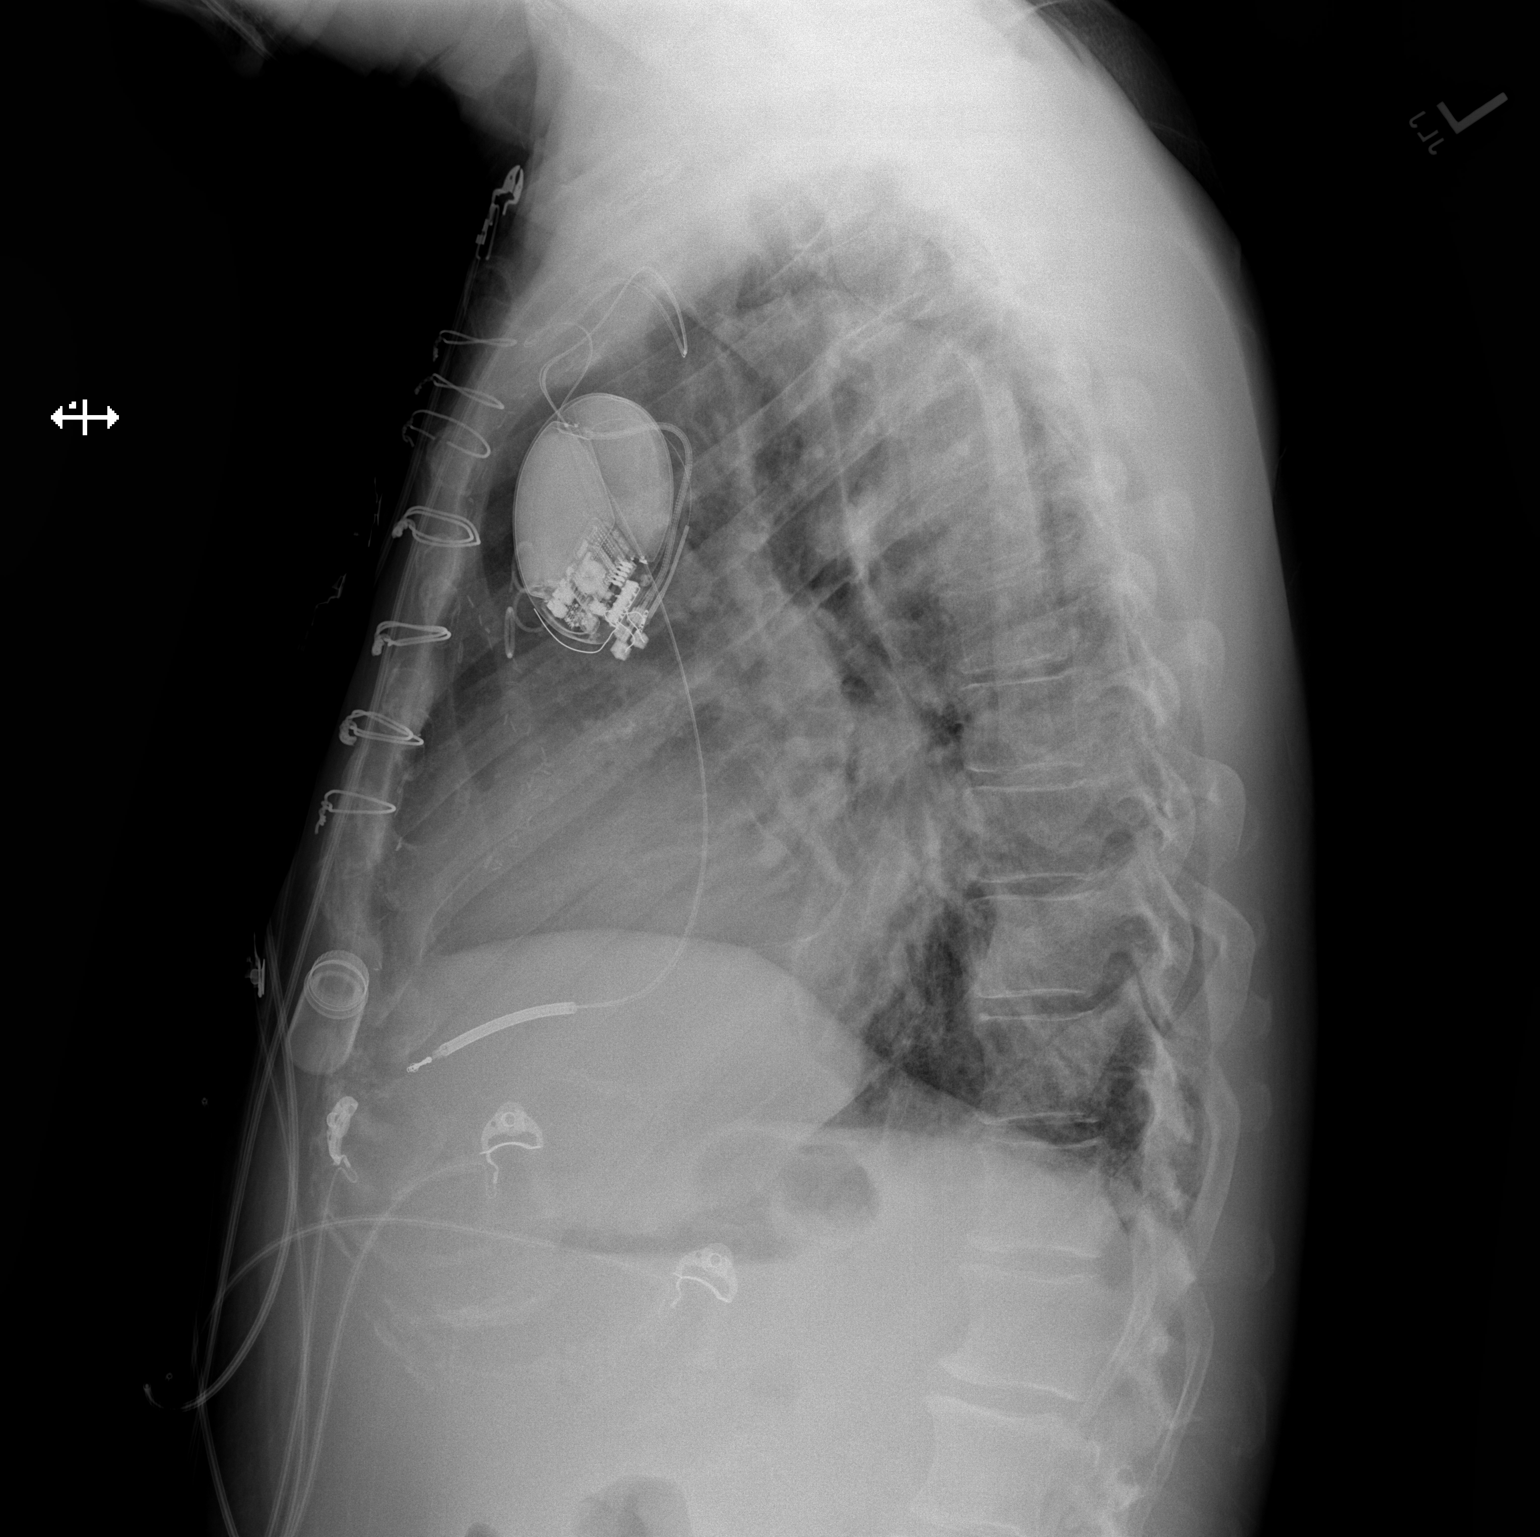

[2 of 2 positions shown; findings below may reference images not displayed]

FINDINGS: Sternotomy wires and left-sided pacemaker are unchanged. Lungs are
adequately inflated without focal airspace consolidation or
effusion. Stable cardiomegaly. Stable prominence of the aortic arch.
Remainder of the exam is unchanged.
IMPRESSION: No acute cardiopulmonary disease.

Stable cardiomegaly and stable prominence of the aortic arch.

## 2018-09-09 DIAGNOSIS — M25512 Pain in left shoulder: Secondary | ICD-10-CM | POA: Diagnosis not present

## 2018-09-11 ENCOUNTER — Telehealth (HOSPITAL_COMMUNITY): Payer: Self-pay

## 2018-09-11 NOTE — Telephone Encounter (Signed)
That's fine. Ok to proceed.

## 2018-09-11 NOTE — Telephone Encounter (Signed)
Contrast is 15 cc of iconVu 200.

## 2018-09-11 NOTE — Telephone Encounter (Signed)
How much contrast is required?

## 2018-09-11 NOTE — Telephone Encounter (Signed)
Marcene Brawn from ortho called to inform that Dr wants to do an arthogram on Left shoulder and wants to know if it safe due to pt having one kidney. Please advise.

## 2018-09-12 ENCOUNTER — Other Ambulatory Visit: Payer: Self-pay | Admitting: Orthopedic Surgery

## 2018-09-12 DIAGNOSIS — M25512 Pain in left shoulder: Secondary | ICD-10-CM

## 2018-09-12 NOTE — Telephone Encounter (Signed)
Clinic made aware to proceed with arthogram.

## 2018-09-15 ENCOUNTER — Other Ambulatory Visit (HOSPITAL_COMMUNITY): Payer: Self-pay | Admitting: Internal Medicine

## 2018-09-16 LAB — CUP PACEART REMOTE DEVICE CHECK
Date Time Interrogation Session: 20190923130514
Implantable Lead Implant Date: 20171114
Implantable Lead Location: 753860
Implantable Lead Model: 181
Implantable Lead Serial Number: 333496
MDC IDC PG IMPLANT DT: 20131220
MDC IDC PG SERIAL: 124654

## 2018-09-17 IMAGING — DX DG CHEST 1V PORT
1 series · 1 of 1 positions shown · non-contrast
Comparison: Chest CT 10/11/2017 and CXR 04/20/2018

CLINICAL DATA: Chest pain this evening

EXAM:
PORTABLE CHEST 1 VIEW

[chest]
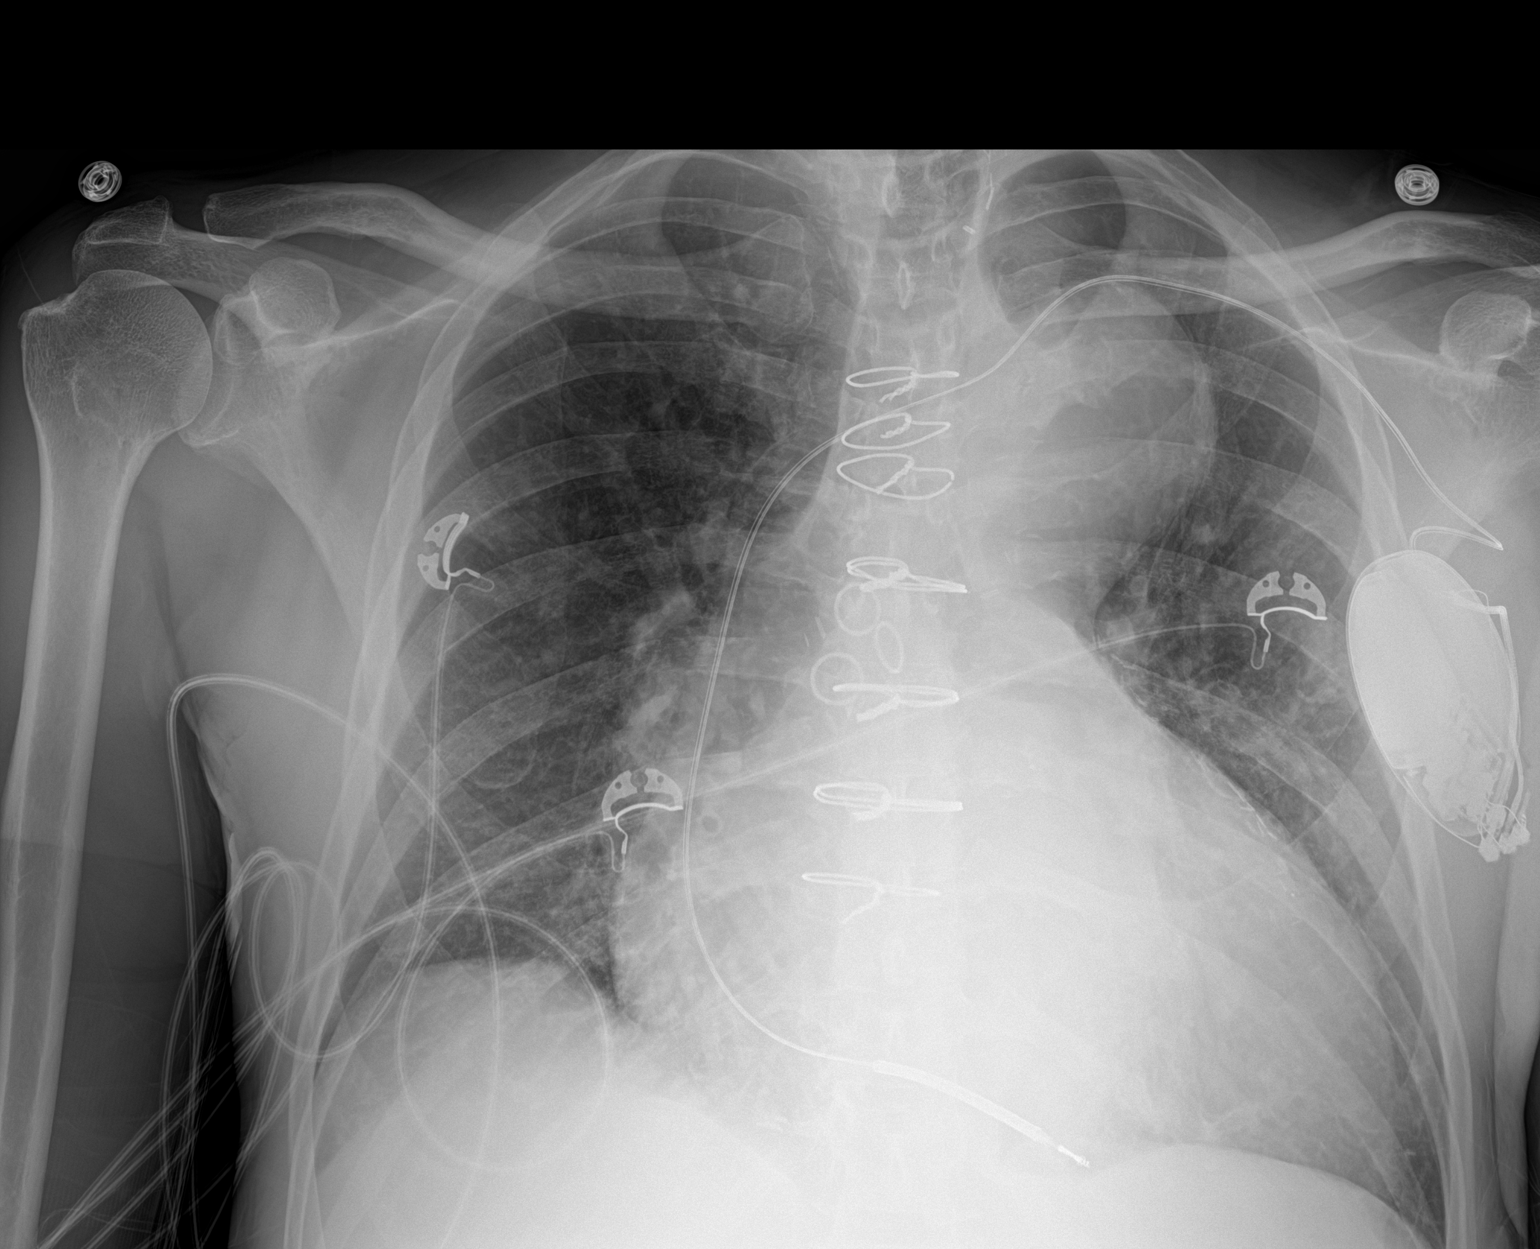

[1 of 1 positions shown; findings below may reference images not displayed]

FINDINGS: Stable cardiomegaly and aneurysmal dilatation of the proximal
descending thoracic aorta unchanged in appearance. Lungs are free of
pulmonary consolidations. No overt pulmonary edema given effusion or
pneumothorax. Median sternotomy sutures and post CABG change are
redemonstrated as well as a left-sided ICD device with lead in the
right ventricle. Surgical clips project over the base of the neck on
the left and superior mediastinum.
IMPRESSION: Stable cardiomegaly with aneurysmal dilatation of the thoracic aorta
as before. No significant change. No active pulmonary disease.

## 2018-09-19 ENCOUNTER — Telehealth (HOSPITAL_COMMUNITY): Payer: Self-pay

## 2018-09-19 DIAGNOSIS — G894 Chronic pain syndrome: Secondary | ICD-10-CM | POA: Diagnosis not present

## 2018-09-19 DIAGNOSIS — M79662 Pain in left lower leg: Secondary | ICD-10-CM | POA: Diagnosis not present

## 2018-09-19 DIAGNOSIS — M545 Low back pain: Secondary | ICD-10-CM | POA: Diagnosis not present

## 2018-09-19 DIAGNOSIS — M542 Cervicalgia: Secondary | ICD-10-CM | POA: Diagnosis not present

## 2018-09-19 DIAGNOSIS — J9801 Acute bronchospasm: Secondary | ICD-10-CM | POA: Diagnosis not present

## 2018-09-19 DIAGNOSIS — M79661 Pain in right lower leg: Secondary | ICD-10-CM | POA: Diagnosis not present

## 2018-09-19 NOTE — Telephone Encounter (Signed)
He will need premedication with solumedrol 80 x 1 and benadryl 50mg  x 1. Ok to proceed. (there is an orderset in Epic if that helps them)

## 2018-09-19 NOTE — Telephone Encounter (Signed)
Josh (PA) from [facility] called due to pt is concerned about the contrast using to do his arthogram. It shows on his allergy list anaphylaxis due to contrast media. Josh needs clearance from Dr. Jacinto Reap before procedure. Please advise.

## 2018-09-27 ENCOUNTER — Telehealth (HOSPITAL_COMMUNITY): Payer: Self-pay | Admitting: Cardiology

## 2018-09-27 NOTE — Telephone Encounter (Signed)
  Surgical clearance requested from [facility]   Patient to be scheduled for arthrogram, will need to hold plavix for 5 days.  Per Dr Haroldine Laws , ok to proceed from a cardiac standpoint. Form signed and faxed to [phone]

## 2018-10-04 ENCOUNTER — Other Ambulatory Visit: Payer: Self-pay | Admitting: Family Medicine

## 2018-10-04 ENCOUNTER — Ambulatory Visit (HOSPITAL_COMMUNITY)
Admission: RE | Admit: 2018-10-04 | Discharge: 2018-10-04 | Disposition: A | Discharge status: Home / Self Care | Payer: Medicaid Other | Source: Ambulatory Visit | Attending: Family Medicine | Admitting: Family Medicine

## 2018-10-04 DIAGNOSIS — R2242 Localized swelling, mass and lump, left lower limb: Secondary | ICD-10-CM | POA: Diagnosis not present

## 2018-10-04 DIAGNOSIS — M7989 Other specified soft tissue disorders: Secondary | ICD-10-CM | POA: Diagnosis not present

## 2018-10-07 ENCOUNTER — Other Ambulatory Visit: Payer: Medicaid Other

## 2018-10-09 ENCOUNTER — Other Ambulatory Visit (HOSPITAL_COMMUNITY): Payer: Self-pay | Admitting: Internal Medicine

## 2018-10-10 ENCOUNTER — Other Ambulatory Visit (HOSPITAL_COMMUNITY): Payer: Self-pay | Admitting: *Deleted

## 2018-10-10 DIAGNOSIS — I5043 Acute on chronic combined systolic (congestive) and diastolic (congestive) heart failure: Secondary | ICD-10-CM

## 2018-10-10 DIAGNOSIS — I10 Essential (primary) hypertension: Secondary | ICD-10-CM

## 2018-10-10 MED ORDER — POTASSIUM CHLORIDE CRYS ER 10 MEQ PO TBCR: 10.0000 meq | EXTENDED_RELEASE_TABLET | Freq: Every day | ORAL | 3 refills | Status: DC

## 2018-10-10 MED ORDER — CARVEDILOL 25 MG PO TABS: ORAL_TABLET | ORAL | 3 refills | Status: DC

## 2018-10-10 MED ORDER — FUROSEMIDE 80 MG PO TABS: 80.0000 mg | ORAL_TABLET | Freq: Two times a day (BID) | ORAL | 3 refills | Status: DC

## 2018-10-10 MED ORDER — HYDRALAZINE HCL 50 MG PO TABS: 75.0000 mg | ORAL_TABLET | Freq: Two times a day (BID) | ORAL | 3 refills | Status: DC

## 2018-10-10 MED ORDER — SACUBITRIL-VALSARTAN 97-103 MG PO TABS: 1.0000 | ORAL_TABLET | Freq: Two times a day (BID) | ORAL | 3 refills | Status: DC

## 2018-10-10 MED ORDER — DOXAZOSIN MESYLATE 1 MG PO TABS: 1.0000 mg | ORAL_TABLET | Freq: Two times a day (BID) | ORAL | 3 refills | Status: DC | PRN

## 2018-10-10 MED ORDER — SPIRONOLACTONE 25 MG PO TABS: 12.5000 mg | ORAL_TABLET | Freq: Every day | ORAL | 3 refills | Status: DC

## 2018-10-10 MED ORDER — CLOPIDOGREL BISULFATE 75 MG PO TABS: 75.0000 mg | ORAL_TABLET | Freq: Every day | ORAL | 3 refills | Status: DC

## 2018-10-10 MED ORDER — ISOSORBIDE MONONITRATE ER 60 MG PO TB24: 60.0000 mg | ORAL_TABLET | Freq: Two times a day (BID) | ORAL | 3 refills | Status: DC

## 2018-10-17 DIAGNOSIS — M79661 Pain in right lower leg: Secondary | ICD-10-CM | POA: Diagnosis not present

## 2018-10-17 DIAGNOSIS — G894 Chronic pain syndrome: Secondary | ICD-10-CM | POA: Diagnosis not present

## 2018-10-17 DIAGNOSIS — M25551 Pain in right hip: Secondary | ICD-10-CM | POA: Diagnosis not present

## 2018-10-17 DIAGNOSIS — M25552 Pain in left hip: Secondary | ICD-10-CM | POA: Diagnosis not present

## 2018-10-17 DIAGNOSIS — M545 Low back pain: Secondary | ICD-10-CM | POA: Diagnosis not present

## 2018-10-17 DIAGNOSIS — M542 Cervicalgia: Secondary | ICD-10-CM | POA: Diagnosis not present

## 2018-10-17 DIAGNOSIS — M79662 Pain in left lower leg: Secondary | ICD-10-CM | POA: Diagnosis not present

## 2018-11-11 ENCOUNTER — Ambulatory Visit (INDEPENDENT_AMBULATORY_CARE_PROVIDER_SITE_OTHER): Payer: Medicaid Other

## 2018-11-11 DIAGNOSIS — R001 Bradycardia, unspecified: Secondary | ICD-10-CM

## 2018-11-11 DIAGNOSIS — I5022 Chronic systolic (congestive) heart failure: Secondary | ICD-10-CM

## 2018-11-12 NOTE — Progress Notes (Signed)
Remote ICD transmission.   

## 2018-11-14 ENCOUNTER — Encounter: Payer: Self-pay | Admitting: Cardiology

## 2018-11-14 DIAGNOSIS — M79662 Pain in left lower leg: Secondary | ICD-10-CM | POA: Diagnosis not present

## 2018-11-14 DIAGNOSIS — M542 Cervicalgia: Secondary | ICD-10-CM | POA: Diagnosis not present

## 2018-11-14 DIAGNOSIS — M79661 Pain in right lower leg: Secondary | ICD-10-CM | POA: Diagnosis not present

## 2018-11-14 DIAGNOSIS — G894 Chronic pain syndrome: Secondary | ICD-10-CM | POA: Diagnosis not present

## 2018-11-14 DIAGNOSIS — M545 Low back pain: Secondary | ICD-10-CM | POA: Diagnosis not present

## 2018-11-25 ENCOUNTER — Telehealth: Payer: Self-pay

## 2018-11-25 NOTE — Telephone Encounter (Signed)
Pt called nurse line stating he wanted his lab results from his hospital stay in August. Please advise.

## 2018-11-26 ENCOUNTER — Encounter (HOSPITAL_COMMUNITY): Payer: Self-pay | Admitting: *Deleted

## 2018-11-26 ENCOUNTER — Other Ambulatory Visit: Payer: Self-pay

## 2018-11-26 ENCOUNTER — Emergency Department (HOSPITAL_COMMUNITY): Payer: Medicaid Other

## 2018-11-26 ENCOUNTER — Emergency Department (HOSPITAL_COMMUNITY)
Admission: EM | Admit: 2018-11-26 | Discharge: 2018-11-26 | Disposition: A | Discharge status: Home / Self Care | Payer: Medicaid Other | Attending: Emergency Medicine | Admitting: Emergency Medicine

## 2018-11-26 DIAGNOSIS — R0602 Shortness of breath: Secondary | ICD-10-CM | POA: Insufficient documentation

## 2018-11-26 DIAGNOSIS — Z5329 Procedure and treatment not carried out because of patient's decision for other reasons: Secondary | ICD-10-CM | POA: Diagnosis not present

## 2018-11-26 DIAGNOSIS — Z91040 Latex allergy status: Secondary | ICD-10-CM | POA: Diagnosis not present

## 2018-11-26 DIAGNOSIS — Z79899 Other long term (current) drug therapy: Secondary | ICD-10-CM | POA: Insufficient documentation

## 2018-11-26 DIAGNOSIS — Z951 Presence of aortocoronary bypass graft: Secondary | ICD-10-CM | POA: Insufficient documentation

## 2018-11-26 DIAGNOSIS — Z95810 Presence of automatic (implantable) cardiac defibrillator: Secondary | ICD-10-CM | POA: Insufficient documentation

## 2018-11-26 DIAGNOSIS — J45909 Unspecified asthma, uncomplicated: Secondary | ICD-10-CM | POA: Diagnosis not present

## 2018-11-26 DIAGNOSIS — I5022 Chronic systolic (congestive) heart failure: Secondary | ICD-10-CM

## 2018-11-26 DIAGNOSIS — I130 Hypertensive heart and chronic kidney disease with heart failure and stage 1 through stage 4 chronic kidney disease, or unspecified chronic kidney disease: Secondary | ICD-10-CM | POA: Insufficient documentation

## 2018-11-26 DIAGNOSIS — R001 Bradycardia, unspecified: Secondary | ICD-10-CM | POA: Diagnosis not present

## 2018-11-26 DIAGNOSIS — I5042 Chronic combined systolic (congestive) and diastolic (congestive) heart failure: Secondary | ICD-10-CM | POA: Insufficient documentation

## 2018-11-26 DIAGNOSIS — N183 Chronic kidney disease, stage 3 (moderate): Secondary | ICD-10-CM | POA: Insufficient documentation

## 2018-11-26 DIAGNOSIS — R05 Cough: Secondary | ICD-10-CM | POA: Diagnosis not present

## 2018-11-26 DIAGNOSIS — R0789 Other chest pain: Secondary | ICD-10-CM | POA: Diagnosis not present

## 2018-11-26 DIAGNOSIS — R072 Precordial pain: Secondary | ICD-10-CM | POA: Diagnosis not present

## 2018-11-26 DIAGNOSIS — I2510 Atherosclerotic heart disease of native coronary artery without angina pectoris: Secondary | ICD-10-CM | POA: Diagnosis not present

## 2018-11-26 DIAGNOSIS — Z7902 Long term (current) use of antithrombotics/antiplatelets: Secondary | ICD-10-CM | POA: Insufficient documentation

## 2018-11-26 DIAGNOSIS — R079 Chest pain, unspecified: Secondary | ICD-10-CM | POA: Diagnosis not present

## 2018-11-26 LAB — I-STAT TROPONIN, ED
TROPONIN I, POC: 0.01 ng/mL (ref 0.00–0.08)
Troponin i, poc: 0.01 ng/mL (ref 0.00–0.08)

## 2018-11-26 LAB — CBC
HCT: 36.1 % — ABNORMAL LOW (ref 39.0–52.0)
Hemoglobin: 11.3 g/dL — ABNORMAL LOW (ref 13.0–17.0)
MCH: 26.6 pg (ref 26.0–34.0)
MCHC: 31.3 g/dL (ref 30.0–36.0)
MCV: 84.9 fL (ref 80.0–100.0)
Platelets: 107 10*3/uL — ABNORMAL LOW (ref 150–400)
RBC: 4.25 MIL/uL (ref 4.22–5.81)
RDW: 14.5 % (ref 11.5–15.5)
WBC: 3.5 10*3/uL — ABNORMAL LOW (ref 4.0–10.5)
nRBC: 0 % (ref 0.0–0.2)

## 2018-11-26 LAB — BASIC METABOLIC PANEL
Anion gap: 12 (ref 5–15)
BUN: 23 mg/dL — ABNORMAL HIGH (ref 6–20)
CO2: 23 mmol/L (ref 22–32)
Calcium: 8.5 mg/dL — ABNORMAL LOW (ref 8.9–10.3)
Chloride: 105 mmol/L (ref 98–111)
Creatinine, Ser: 1.66 mg/dL — ABNORMAL HIGH (ref 0.61–1.24)
GFR calc Af Amer: 53 mL/min — ABNORMAL LOW (ref >60)
GFR calc non Af Amer: 46 mL/min — ABNORMAL LOW (ref >60)
GLUCOSE: 109 mg/dL — AB (ref 70–99)
Potassium: 3.7 mmol/L (ref 3.5–5.1)
Sodium: 140 mmol/L (ref 135–145)

## 2018-11-26 LAB — PROTIME-INR
INR: 1.15
Prothrombin Time: 14.6 seconds (ref 11.4–15.2)

## 2018-11-26 NOTE — ED Provider Notes (Signed)
7:38 AM Care assumed from Dr. Rex Kras.  At time of transfer care, patient is awaiting delta troponin and then consultation with cardiology.  According to previous team, patient has a history of CAD status post CABG, CHF with pacemaker, prior neck dissection status post repair in 2010, asthma, GERD and recurrent chest pains who presents with chest pain.  Patient reportedly woke up from sleep with severe chest pain radiating to his left shoulder with associated nausea, diaphoresis, shortness of breath.  The pain did not improve with his own nitroglycerin as it typically does so he called EMS.  Patient received morphine and has had improvement in the pain.  Initial troponin was negative.  Chest x-ray showed no acute changes.  CBC shows mild anemia with no leukocytosis.  BMP with unchanged kidney function of 1.66.  Plan of care is to call cardiology after second troponin is resulted and have them evaluate patient to determine disposition.  Second troponin is negative, cardiology will be called.  While awaiting for cardiology evaluation patient left AGAINST MEDICAL ADVICE as he did not want to wait longer.  Patient told nursing that he will follow-up with his cardiologist and have improved symptoms.  Patient left AMA.      Tegeler, Gwenyth Allegra, MD 11/26/18 [phone]

## 2018-11-26 NOTE — ED Notes (Signed)
Pt and visitor stating they are frustrated and don't understand what they are waiting for to be released. Advised that cardiology needs to see them to ok them for d/c, unsure of how long that will take

## 2018-11-26 NOTE — ED Notes (Signed)
Pt wants to leave AMA, states he does not want to wait for Cards eval, and that he has a Cardiology appt later this week. Dr. Sherry Ruffing notified, pt will leave AMA

## 2018-11-26 NOTE — Consult Note (Signed)
Advanced Heart Failure Team Consult Note   Primary Physician: Nuala Alpha, DO PCP-Cardiologist:  Glori Bickers, MD  Reason for Consultation: Chest pain  HPI:    Chad Laurice Record. is seen today for evaluation of chest pain at the request of Dr Sherry Ruffing.   Chad Laurice Record. is a 57 y.o. male with history of HTN, CAD with MI s/p CABG 2006 and DES to native PDA in 9357, systolic HF due to ICM, EF 20-25%, s/p [employer] ICD s/p lead extraction and replacement in 2017. 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   Admitted 04/2018 with CP. Troponin negative and EKG unchanged. CP thought to be secondary to HTN and ?anxiety. BP meds adjusted.   Last seen in HF clinic 07/2018. He was doing well at the time. BP was well controlled. No changes were made.  He woke up with midsternal CP radiating to his back that was unrelieved with SL nitro x4. He has had this pain several times in the past. Called EMS and was given ASA, 1 sl NTG and morphine, which improved pain. Presented to [facility] for further work up. SBP 90s on arrival. Now feels fine. No further CP. Says he has been doing very well recently. Active without any CP or significant dyspnea. Follows BP very closely at home and SBP almost always < 130. No othopnes, PND or edema. About to start an insurance business.   Pertinent admission labs: K 3.7, creatinine 1.66, troponin 0.01 > 0.01, WBC 3.5, Hemoglobin 11.3  ECg with nSR. TWI laterally but little change from previous  CXR: stable CM, no acute pulmonary disease or aneurysm. Stable appearance of thoracic aortic aneurysm.   Myoview 11/2015  1. Evidence for very mild reversibility and ischemia involving the anterolateral wall and a portion of the lateral  wall. 2. Large infarct involving the inferior wall. There is dyskinesia in the inferior wall. 3. Left ventricular ejection fraction is 32%. Left ventricle dilatation.  Echo 03/2018: EF 20-25%, diffuse HK, grade 2 DD, mild to mod AI, mod MR, RV mildly reduced, LA severely dilated, RA mildly dilated, mod TR, PA peak pressure 54 mm Hg  RHC 03/2018: RA = 7 RV = 43/11 PA = 46/22 (31) PCW = 15 Fick cardiac output/index = 4.1/2.1 PVR = 3.9 WU Ao sat = 95% PA sat = 53%, 55%  CPX 05/15/18 FVC 2.86 (67%)    FEV1 2.26 (68%)     FEV1/FVC 79 (100%)     MVV 106 (70%)    Resting HR: 71 Peak HR: 110  (67% age predicted max HR)  BP rest: 100/64 BP peak: 118/66  Peak VO2: 18.7 (56% predicted peak VO2) VE/VCO2 slope: 34 OUES: 1.45 Peak RER: 1.04 Ventilatory Threshold: 11.8 (35% predicted and 63% measured peak VO2) VE/MVV: 43% O2pulse: 12  (80% predicted O2pulse)  Review of Systems: [y] = yes, [ ]  = no   General: Weight gain [ ] ; Weight loss [ ] ; Anorexia [ ] ; Fatigue [ ] ; Fever [ ] ; Chills [ ] ; Weakness [ ]   Cardiac: Chest pain/pressure [ y]; Resting SOB [ ] ; Exertional SOB [ ] ; Orthopnea [ ] ; Pedal Edema [ ] ; Palpitations [ ] ; Syncope [ ] ; Presyncope [ ] ; Paroxysmal nocturnal dyspnea[ ]   Pulmonary: Cough [ ] ; Wheezing[ ] ;  Hemoptysis[ ] ; Sputum [ ] ; Snoring [ ]   GI: Vomiting[ ] ; Dysphagia[ ] ; Melena[ ] ; Hematochezia [ ] ; Heartburn[ ] ; Abdominal pain [ ] ; Constipation [ ] ; Diarrhea [ ] ; BRBPR [ ]   GU: Hematuria[ ] ; Dysuria [ ] ; Nocturia[ ]   Vascular: Pain in legs with walking [ ] ; Pain in feet with lying flat [ ] ; Non-healing sores [ ] ; Stroke [ ] ; TIA [ ] ; Slurred speech [ ] ;  Neuro: Headaches[ ] ; Vertigo[ ] ; Seizures[ ] ; Paresthesias[ ] ;Blurred vision [ ] ; Diplopia [ ] ; Vision changes [ ]   Ortho/Skin: Arthritis Blue.Reese ]; Joint pain Blue.Reese ]; Muscle pain [ ] ; Joint swelling [ ] ; Back Pain [ ] ; Rash [ ]   Psych: Depression[ ] ; Anxiety[ ]   Heme: Bleeding problems [ ] ; Clotting  disorders [ ] ; Anemia [ ]   Endocrine: Diabetes [ ] ; Thyroid dysfunction[ ]   Home Medications Prior to Admission medications   Medication Sig Start Date End Date Taking? Authorizing Provider  albuterol (PROVENTIL HFA;VENTOLIN HFA) 108 (90 Base) MCG/ACT inhaler Inhale 2 puffs into the lungs every 6 (six) hours as needed for wheezing or shortness of breath. 10/19/17  Yes Mikell, Jeani Sow, MD  aspirin EC 81 MG tablet Take 1 tablet (81 mg total) by mouth daily. Can take 4 tabs (324 mg total) as needed for chest pain. DO NOT TAKE multiple 325 mg tabs. 05/01/18  Yes Shirley Friar, PA-C  B Complex-C (SUPER B COMPLEX PO) Take 3 tablets by mouth daily.    Yes [provider]  carvedilol (COREG) 25 MG tablet TAKE 1 TABLET BY MOUTH TWICE DAILY WITH  A  MEAL Patient taking differently: Take 25 mg by mouth 2 (two) times daily with a meal.  10/10/18  Yes Jeris Roser, Shaune Pascal, MD  clopidogrel (PLAVIX) 75 MG tablet Take 1 tablet (75 mg total) by mouth daily. 10/10/18  Yes Caron Ode, Shaune Pascal, MD  doxazosin (CARDURA) 1 MG tablet Take 1 tablet (1 mg total) by mouth every 12 (twelve) hours as needed (for systolic BP > 595 mm Hg.). 10/10/18  Yes Mackenzee Becvar, Shaune Pascal, MD  fluticasone (FLONASE) 50 MCG/ACT nasal spray Place 2 sprays into both nostrils daily as needed for allergies or rhinitis.    Yes [provider]  furosemide (LASIX) 80 MG tablet Take 1 tablet (80 mg total) by mouth 2 (two) times daily. 10/10/18  Yes Tramaine Snell, Shaune Pascal, MD  hydrALAZINE (APRESOLINE) 50 MG tablet Take 1.5 tablets (75 mg total) by mouth 2 (two) times daily. 10/10/18  Yes Maraki Macquarrie, Shaune Pascal, MD  ipratropium-albuterol (DUONEB) 0.5-2.5 (3) MG/3ML SOLN Take 3 mLs by nebulization every 6 (six) hours as needed. Patient taking differently: Take 3 mLs by nebulization every 6 (six) hours as needed (for shortness of breath or wheezing).  12/20/17  Yes Lovenia Kim, MD  isosorbide mononitrate (IMDUR) 60 MG 24 hr tablet  Take 1 tablet (60 mg total) by mouth 2 (two) times daily. 10/10/18  Yes Marisol Giambra, Shaune Pascal, MD  nitroGLYCERIN (NITROSTAT) 0.4 MG SL tablet PLACE 1 TABLET UNDER THE TONGUE EVERY 5 MINUTES FOR 3 DOSES AS NEEDED FOR CHEST PAIN Patient taking differently: Place 0.4 mg under the tongue every 5 (five) minutes as needed for chest pain.  09/16/18  Yes Laiyla Slagel, Shaune Pascal, MD  Oxycodone HCl 10 MG TABS Take 20 mg by mouth every 8 (eight) hours.    Yes [provider]  potassium chloride (KLOR-CON M10) 10 MEQ tablet Take 1 tablet (10 mEq total) by mouth daily. 10/10/18  Yes  Aloma Boch, Shaune Pascal, MD  sacubitril-valsartan (ENTRESTO) 97-103 MG Take 1 tablet by mouth 2 (two) times daily. 10/10/18  Yes Kaige Whistler, Shaune Pascal, MD  spironolactone (ALDACTONE) 25 MG tablet Take 0.5 tablets (12.5 mg total) by mouth daily. 10/10/18  Yes Darrel Gloss, Shaune Pascal, MD  HYDROcodone-acetaminophen (NORCO/VICODIN) 5-325 MG tablet Take 1 tablet by mouth every 6 (six) hours as needed for severe pain. Patient not taking: Reported on 11/26/2018 04/07/18   Carmin Muskrat, MD  MITIGARE 0.6 MG CAPS TAKE 2 CAPSULES BY MOUTH WHEN YOU FEEL AN ATTACK COMING ON, TAKE 1 CAPSULE THE FOLLOWING DAY Patient not taking: Reported on 11/26/2018 08/18/18   Nuala Alpha, DO  traMADol (ULTRAM) 50 MG tablet Take 1 tablet (50 mg total) by mouth every 6 (six) hours as needed. Patient not taking: Reported on 11/26/2018 06/25/18 06/25/19  Ralene Ok, MD    Past Medical History: Past Medical History:  Diagnosis Date  . AICD (automatic cardioverter/defibrillator) present   . Anemia   . Anginal pain ([place])   . Aortic dissection, thoracoabdominal ([place])    7/10: Type I s/p repair  . Arthritis   . Asthma   . CAD (coronary artery disease)    a. s/p CABG 2006;  b. DES to PDA 2011 (cath: Dx not seen, dRCA/PDA tx with DES; S-PDA occluded (culprit), S-Dx occluded, S-RI and OM ok, L-LAD ok  . Carotid stenosis    dopplers 2011: 0-39% bilat.  . Chest pain  syndrome   . CHF (congestive heart failure) ([place])   . Chronic bronchitis ([place])   . Chronic lower back pain   . Chronic systolic heart failure (H[facility])    a. 12/13 ECHO: EF 35-40%, sept, apical & posterobasal HK, LV mod dil & sys fx mod reduced, mild AI, MV mild reg, TV mild reg  . Complication of anesthesia    "difficult to wake afterwards a couple of times"  . 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 Dyspnea   . Family history of adverse reaction to anesthesia    "sister hard to wake up"  . GERD (gastroesophageal reflux disease)   . Gout   . Heart murmur   . HLD (hyperlipidemia)   . HTN (hypertension)    severe  . Myocardial infarction [facility])    "many" (11/07/2016)  . PONV (postoperative nausea and vomiting)   . Sleep apnea    "never RX'd mask" (11/07/2016)  . Thyroid cancer ([place])    Hertle Cell    Past Surgical History: Past Surgical History:  Procedure Laterality Date  . BACK SURGERY    . CARDIAC CATHETERIZATION     "several" (11/07/2016)  . CARDIAC DEFIBRILLATOR PLACEMENT  11/2005   [employer]; Archie Endo 05/09/2011  . CORONARY ANGIOPLASTY WITH STENT PLACEMENT     "I've had 1-2 stents" (11/07/2016)  . CORONARY ARTERY BYPASS GRAFT  06/21/2009   "CABG X2"  . CORONARY ARTERY BYPASS GRAFT  06/29/2005   "CABG X7"  . ICD LEAD REMOVAL  11/07/2016  . ICD LEAD REMOVAL N/A 11/07/2016   Procedure: ICD LEAD REMOVAL, INSERTION OF NEW ICD LEAD;  Surgeon: Evans Lance, MD;  Location: [place];  Service: Cardiovascular;  Laterality: N/A;  Dr. Prescott Gum to backup case  . IMPLANTABLE CARDIOVERTER DEFIBRILLATOR (ICD) GENERATOR CHANGE N/A 12/13/2012   Procedure: ICD GENERATOR CHANGE;  Surgeon: Evans Lance, MD;  Location[facility] C[facility] LAB;  Service: Cardiovascular;  Laterality: N/A;  . INGUINAL  HERNIA REPAIR Bilateral 06/25/2018   Procedure: LAPAROSCOPIC  BILATERAL INGUINAL HERNIA REPAIRS;  Surgeon: Ralene Ok, MD;  Location: [facility];  Service: General;  Laterality: Bilateral;  . INSERTION OF MESH Bilateral 06/25/2018   Procedure: INSERTION OF MESH;  Surgeon: Ralene Ok, MD;  Location: [place];  Service: General;  Laterality: Bilateral;  . [place] SURGERY  01/2001    most recent within 5-10 years  . RIGHT HEART CATH N/A 04/05/2018   Procedure: RIGHT HEART CATH;  Surgeon: Jolaine Artist, MD;  Location: [place] CV LAB;  Service: Cardiovascular;  Laterality: N/A;  . SHOULDER ARTHROSCOPY WITH ROTATOR CUFF REPAIR Bilateral   . Status post emergency repair of a type A ascending aortic dissection with a hemiarch reconstruction of the ascending aorta  using a 28-mm Hemashield graft with redo sternotomy and revision of previous bypass grafts in June 2010.    . TESTICLE SURGERY    . THYROIDECTOMY, PARTIAL  06/20/2011  . TONSILLECTOMY    . UMBILICAL HERNIA REPAIR N/A 06/25/2018   Procedure: UMBILICAL HERNIA REPAIR;  Surgeon: Ralene Ok, MD;  Location: [facility] OR;  Service: General;  Laterality: N/A;  . VASECTOMY      Family History: Family History  Problem Relation Age of Onset  . Hypertension Father   . Heart disease Father   . Early death Father   . COPD Father   . Hypertension Mother   . Coronary artery disease Unknown     Social History: Social History   Socioeconomic History  . Marital status: Divorced    Spouse name: Not on file  . Number of children: Not on file  . Years of education: Not on file  . Highest education level: Not on file  Occupational History  . Occupation: disabled  Social Needs  . Financial resource strain: Not on file  . Food insecurity:    Worry: Not on file    Inability: Not on file  . Transportation needs:    Medical: Not on file    Non-medical: Not on file  Tobacco Use  . Smoking status: Never Smoker  . Smokeless tobacco: Never Used  Substance and Sexual Activity  . Alcohol use: No  . Drug use: No  . Sexual activity: Yes  Lifestyle  . Physical activity:     Days per week: Not on file    Minutes per session: Not on file  . Stress: Not on file  Relationships  . Social connections:    Talks on phone: Not on file    Gets together: Not on file    Attends religious service: Not on file    Active member of club or organization: Not on file    Attends meetings of clubs or organizations: Not on file    Relationship status: Not on file  Other Topics Concern  . Not on file  Social History Narrative   Engaged and lives with fiancee and 4 kids.    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     Allergies:  Allergies  Allergen Reactions  . Contrast Media [Iodinated Diagnostic Agents] Anaphylaxis  . Iohexol Anaphylaxis and Other (See Comments)    PT HAS ANAPHYLAXIS WITH CONTRAST MEDIA!  . Lipitor [Atorvastatin Calcium] Anaphylaxis and Other (See Comments)    Large doses  . Shellfish Allergy Anaphylaxis  . Sulfa Antibiotics Shortness Of Breath and Swelling  . Sulfonamide Derivatives Shortness Of Breath and Swelling  . Almond Oil Itching    FACIAL/MOUTH ITCHING  .  Metrizamide Swelling    SWELLING REACTION UNSPECIFIED   . Zocor [Simvastatin] Other (See Comments)    Muscle cramps  . Latex Rash and Other (See Comments)    With long periods of exposure  . Peach [Prunus Persica] Itching    Objective:    Vital Signs:   Temp:  [97.9 F (36.6 C)] 97.9 F (36.6 C) (12/03 0354) Pulse Rate:  [53-76] 53 (12/03 0830) Resp:  [11-18] 11 (12/03 0830) BP: (91-98)/(61-65) 98/61 (12/03 0830) SpO2:  [93 %-99 %] 96 % (12/03 0830)    Weight change: There were no vitals filed for this visit.  Intake/Output:  No intake or output data in the 24 hours ending 11/26/18 1018    Physical Exam   General:  Well appearing. No resp difficulty HEENT: normal Neck: supple. no JVD. Carotids 2+ bilat; no bruits. No lymphadenopathy or thryomegaly appreciated. Cor: PMI nondisplaced. Regular rate & rhythm. A/6 AS and mild AI murmur Lungs: clear Abdomen: soft,  nontender, nondistended. No hepatosplenomegaly. No bruits or masses. Good bowel sounds. Extremities: no cyanosis, clubbing, rash, edema Neuro: alert & orientedx3, cranial nerves grossly intact. moves all 4 extremities w/o difficulty. Affect pleasant   Telemetry   Unhooked currently   EKG    NSR 72 bpm with TWI laterally. Unchanged (Personally reviewed)  Labs   Basic Metabolic Panel: Recent Labs  Lab 11/26/18 0358  NA 140  K 3.7  CL 105  CO2 23  GLUCOSE 109*  BUN 23*  CREATININE 1.66*  CALCIUM 8.5*    Liver Function Tests: No results for input(s): AST, ALT, ALKPHOS, BILITOT, PROT, ALBUMIN in the last 168 hours. No results for input(s): LIPASE, AMYLASE in the last 168 hours. No results for input(s): AMMONIA in the last 168 hours.  CBC: Recent Labs  Lab 11/26/18 0358  WBC 3.5*  HGB 11.3*  HCT 36.1*  MCV 84.9  PLT 107*    Cardiac Enzymes: No results for input(s): CKTOTAL, CKMB, CKMBINDEX, TROPONINI in the last 168 hours.  BNP: BNP (last 3 results) Recent Labs    12/15/17 0518 03/25/18 1609 07/29/18 1613  BNP 2,998.3* 2,260.3* 1,292.6*    ProBNP (last 3 results) No results for input(s): PROBNP in the last 8760 hours.   CBG: No results for input(s): GLUCAP in the last 168 hours.  Coagulation Studies: Recent Labs    11/26/18 0358  LABPROT 14.6  INR 1.15     Imaging   Dg Chest 2 View  Result Date: 11/26/2018 CLINICAL DATA:  Back pain. History of aortic aneurysm and CHF. A cough for EXAM: CHEST - 2 VIEW COMPARISON:  Chest radiograph Apr 30, 2018 FINDINGS: Stable cardiomegaly. Aneurysmal aorta. Status post status post median sternotomy for CABG. No pleural effusion or focal consolidation. No pneumothorax. LEFT single lead AICD in situ. Surgical clips LEFT neck. IMPRESSION: 1. Stable cardiomegaly.  No acute pulmonary or aneurysm. 2. Stable radiographic appearance of thoracic aortic aneurysm. Electronically Signed   By: Elon Alas M.D.   On:  11/26/2018 04:19      Medications:     Current Medications:    Infusions:      Patient Profile   Chad Trennon Torbeck. is a 57 y.o. male with history of HTN, CAD with MI s/p CABG 2006 and DES to native PDA in 6734, systolic HF due to ICM, EF 20-25%, s/p [employer] ICD s/p lead extraction and replacement in 2017. In July 2010 had a large Type I aortic dissection all the way down to  illiacs involving left kidney. 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   Presented with CP that was unrelieved with 4 SL nitroglycerin. Improved with morphine.  Assessment/Plan    1. Chest Pain with hx of CAD s/p CABG 2006:  - Cardiolite 11/2015 with evidence for mild reversibility involving the anterolateral wall and a portion of the lateral wall. - We have avoided left heart cath due to dissection and CKD with solitary kidney (lost kidney due to dissection) - Continue Plavix and ASA 81 mg daily.   - Statin intolerance at any dose. Also unable to tolerate Zetia - LDL 131 07/2018. Would benefit from referral to lipid clinic - Troponin 0.01 > 0.01. EKG unchanged. No longer having CP. Resolved with morphine.   2. Chronic systolic HF: Ischemic cardiomyopathy.  Echo (2/16) with EF 25%. S/p [employer] ICD. Echo 3/18. EF 20-25%. Echo 04/18/18: EF 20-25%, grade 2 DD - RHC 04/05/18 with well compensated filling pressures with mildly to moderately reduced CO.  - He has seen w Dr. Prescott Gum. Given previous dissection and aortic root replacement likely not a candidate for VAD here. - He has been seen previously at [facility] for transplant eval but CPX too good at this point.  - Volume status stable.  - Continue lasix 80 mg BID - Continue Entresto 97/103 bid - Continue carvedilol 25 mg twice a day.  - Continue hydralazine 75 mg bid/ Imdur 60 bid.   - Continue  spironolactone 12.5 mg daily.   3. 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 HTN:  - SBP 90s today.  - Stressed importance of not over-treating in setting of solitary kidney.Also stressed not to check BP too frequently and micromanage   5. H/o Type I aortic dissection: Follows with CVTS.  - He has seen w Dr. Prescott Gum. Given previous dissection and aortic root replacement likely not candidate for VAD. - Echo 04/18/18: aortic root, ascending aorta, and aortic arch all mildly dilated  6. 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 He denies anxiety and is not interested in medication  Medication concerns reviewed with patient and pharmacy team. Barriers identified: none at this time  Length of Stay: [place], NP  11/26/2018, 10:18 AM  Advanced Heart Failure Team Pager [phone] (M-F; [place])  Please contact [facility] for night-coverage after hours (4p -7a ) and weekends on [email or web address]  Patient seen and examined with the above-signed Advanced Practice Provider and/or Housestaff. I personally reviewed laboratory data, imaging studies and relevant notes. I independently examined the patient and formulated the important aspects of the plan. I have edited the note to reflect any of my changes or salient points. I have personally discussed the plan with the patient and/or family.  Although CP seems fairly typical there is currently no objective evidence of ischemia. ECG and troponins are normal. He also has not had any progressive ischemic symptoms. He has had very similar presentations in past which resolved quickly. With h/o aortic dissection and solitary kidney will continue medical management unless he develops objective signs of ischemia or has progressive and reproducible anginal symptoms. I will see him next week in Clinic for f/u.   Glori Bickers, MD  12:10 PM

## 2018-11-26 NOTE — ED Provider Notes (Signed)
[facility] EMERGENCY DEPARTMENT Provider Note   CSN: [id number] Arrival date & time: 11/26/18  2355     History   Chief Complaint Chief Complaint  Patient presents with  . Chest Pain    HPI Chad [name]. is a 57 y.o. male.  57yo M w/ extensive PMH including CAD s/p CABG, CHF, aortic dissection s/p repair who p/w chest pain. Just prior to arrival, patient was awakened from sleep by central chest pressure that radiates to his back between his shoulders. He states he has had this same pain many, many times previously, last episode was several months ago. He states that when the pain happens, he can usually manage it at home with NTG; however, he took at least 4 NTG with no relief of pain. Called EMS; they gave him 324mg  ASA and 2 doses of morphine which improved his pain. He endorses mild pain currently. He has had associated mild SOB, nausea, and diaphoresis. He reports compliance with medications and no recent illness. No alcohol, tobacco, or drug use.  The history is provided by the patient.  Chest Pain      Past Medical History:  Diagnosis Date  . AICD (automatic cardioverter/defibrillator) present   . Anemia   . Anginal pain ([place])   . Aortic dissection, thoracoabdominal ([place])    7/10: Type I s/p repair  . Arthritis   . Asthma   . CAD (coronary artery disease)    a. s/p CABG 2006;  b. DES to PDA 2011 (cath: Dx not seen, dRCA/PDA tx with DES; S-PDA occluded (culprit), S-Dx occluded, S-RI and OM ok, L-LAD ok  . Carotid stenosis    dopplers 2011: 0-39% bilat.  . Chest pain syndrome   . CHF (congestive heart failure) ([place])   . Chronic bronchitis ([place])   . Chronic lower back pain   . Chronic systolic heart failure (HCC)    a. 12/13 ECHO: EF 35-40%, sept, apical & posterobasal HK, LV mod dil & sys fx mod reduced, mild AI, MV mild reg, TV mild reg  . Complication of anesthesia    "difficult to wake afterwards a couple of times"  . CRI (chronic renal  insufficiency)    "one kidney is gone; the other is hanging on" (11/07/2016)  . Dyspnea   . Family history of adverse reaction to anesthesia    "sister hard to wake up"  . GERD (gastroesophageal reflux disease)   . Gout   . Heart murmur   . HLD (hyperlipidemia)   . HTN (hypertension)    severe  . Myocardial infarction [facility])    "many" (11/07/2016)  . PONV (postoperative nausea and vomiting)   . Sleep apnea    "never RX'd mask" (11/07/2016)  . Thyroid cancer ([place])    [employer]    Patient Active Problem List   Diagnosis Date Noted  . Pain in joint of left shoulder 07/05/2018  . 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 Hip region mass, left 06/24/2018  . Acute idiopathic gout of right foot 05/22/2018  . Inguinal hernia 02/18/2018  . Prediabetes 12/20/2017  . Renal insufficiency   . Hypokalemia   . CHF exacerbation ([place]) 12/15/2017  . Failure of implantable cardioverter-defibrillator (ICD) lead 11/07/2016  . Dyspnea 05/12/2016  . Erectile dysfunction 04/18/2016  . Pain in the chest   . Aneurysm of descending thoracic aorta ([place])   . Nasal folliculitis 73/22/0254  . Chronic systolic CHF (  congestive heart failure) ([place]) 04/12/2015  . CKD (chronic kidney disease) stage 3, GFR 30-59 ml/min (HCC) 04/12/2015  . Unstable angina ([place]) 01/25/2015  . Chest pain 10/10/2014  . Health care maintenance 03/19/2014  . Local reaction to tetanus vaccine 03/19/2014  . Neck pain on left side 06/03/2013  . Penile discharge 02/26/2013  . Syncope 01/17/2013  . Chronic combined systolic and diastolic CHF (congestive heart failure) ([place]) 01/16/2013  . Low back pain 04/23/2012  . Chest pain syndrome   . Insomnia 09/14/2011  . 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 Coronary atherosclerosis of native coronary artery 06/12/2011  . Bradycardia 05/03/2011  . Aortic dissection, thoracoabdominal ([place])   . Chronic  kidney disease (CKD), stage II (mild)   . ORTHOSTATIC HYPOTENSION 05/03/2010  . CAROTID BRUIT 01/25/2010  . PANCYTOPENIA 11/01/2009  . Fatigue 10/25/2009  . DISSECTING AORTIC ANEURYSM THORACOABDOMINAL 07/14/2009  . SCIATICA, RIGHT 05/18/2009  . HYPERLIPIDEMIA-MIXED 09/29/2008  . HYPERTENSION, BENIGN 09/29/2008  . CAD, ARTERY BYPASS GRAFT 09/29/2008  . SYSTOLIC HEART FAILURE, CHRONIC 09/29/2008  . ICD - IN SITU 09/29/2008    Past Surgical History:  Procedure Laterality Date  . BACK SURGERY    . CARDIAC CATHETERIZATION     "several" (11/07/2016)  . CARDIAC DEFIBRILLATOR PLACEMENT  11/2005   [employer]; Archie Endo 05/09/2011  . CORONARY ANGIOPLASTY WITH STENT PLACEMENT     "I've had 1-2 stents" (11/07/2016)  . CORONARY ARTERY BYPASS GRAFT  06/21/2009   "CABG X2"  . CORONARY ARTERY BYPASS GRAFT  06/29/2005   "CABG X7"  . ICD LEAD REMOVAL  11/07/2016  . ICD LEAD REMOVAL N/A 11/07/2016   Procedure: ICD LEAD REMOVAL, INSERTION OF NEW ICD LEAD;  Surgeon: Evans Lance, MD;  Location: [facility];  Service: Cardiovascular;  Laterality: N/A;  Dr. Prescott Gum to backup case  . IMPLANTABLE CARDIOVERTER DEFIBRILLATOR (ICD) GENERATOR CHANGE N/A 12/13/2012   Procedure: ICD GENERATOR CHANGE;  Surgeon: Evans Lance, MD;  Location: [facility] C[facility] LAB;  Service: Cardiovascular;  Laterality: N/A;  . INGUINAL HERNIA REPAIR Bilateral 06/25/2018   Procedure: LAPAROSCOPIC  BILATERAL INGUINAL HERNIA REPAIRS;  Surgeon: Ralene Ok, MD;  Location: [facility];  Service: General;  Laterality: Bilateral;  . INSERTION OF MESH Bilateral 06/25/2018   Procedure: INSERTION OF MESH;  Surgeon: Ralene Ok, MD;  Location: [facility];  Service: General;  Laterality: Bilateral;  . [place] SURGERY  01/2001    most recent within 5-10 years  . RIGHT HEART CATH N/A 04/05/2018   Procedure: RIGHT HEART CATH;  Surgeon: Jolaine Artist, MD;  Location: [facility] CV LAB;  Service: Cardiovascular;  Laterality: N/A;  . SHOULDER  ARTHROSCOPY WITH ROTATOR CUFF REPAIR Bilateral   . Status post emergency repair of a type A ascending aortic dissection with a hemiarch reconstruction of the ascending aorta  using a 28-mm Hemashield graft with redo sternotomy and revision of previous bypass grafts in June 2010.    . TESTICLE SURGERY    . THYROIDECTOMY, PARTIAL  06/20/2011  . TONSILLECTOMY    . UMBILICAL HERNIA REPAIR N/A 06/25/2018   Procedure: UMBILICAL HERNIA REPAIR;  Surgeon: Ralene Ok, MD;  Location: [facility];  Service: General;  Laterality: N/A;  . VASECTOMY          Home Medications    Prior to Admission medications   Medication Sig Start Date End Date Taking? Authorizing Provider  albuterol (PROVENTIL HFA;VENTOLIN HFA) 108 (90 Base) MCG/ACT inhaler Inhale 2 puffs into the lungs  every 6 (six) hours as needed for wheezing or shortness of breath. 10/19/17  Yes Mikell, Jeani Sow, MD  aspirin EC 81 MG tablet Take 1 tablet (81 mg total) by mouth daily. Can take 4 tabs (324 mg total) as needed for chest pain. DO NOT TAKE multiple 325 mg tabs. 05/01/18  Yes Shirley Friar, PA-C  B Complex-C (SUPER B COMPLEX PO) Take 3 tablets by mouth daily.    Yes [provider]  carvedilol (COREG) 25 MG tablet TAKE 1 TABLET BY MOUTH TWICE DAILY WITH  A  MEAL Patient taking differently: Take 25 mg by mouth 2 (two) times daily with a meal.  10/10/18  Yes Bensimhon, Shaune Pascal, MD  clopidogrel (PLAVIX) 75 MG tablet Take 1 tablet (75 mg total) by mouth daily. 10/10/18  Yes Bensimhon, Shaune Pascal, MD  doxazosin (CARDURA) 1 MG tablet Take 1 tablet (1 mg total) by mouth every 12 (twelve) hours as needed (for systolic BP > 025 mm Hg.). 10/10/18  Yes Bensimhon, Shaune Pascal, MD  fluticasone (FLONASE) 50 MCG/ACT nasal spray Place 2 sprays into both nostrils daily as needed for allergies or rhinitis.    Yes [provider]  furosemide (LASIX) 80 MG tablet Take 1 tablet (80 mg total) by mouth 2 (two) times daily. 10/10/18  Yes  Bensimhon, Shaune Pascal, MD  hydrALAZINE (APRESOLINE) 50 MG tablet Take 1.5 tablets (75 mg total) by mouth 2 (two) times daily. 10/10/18  Yes Bensimhon, Shaune Pascal, MD  ipratropium-albuterol (DUONEB) 0.5-2.5 (3) MG/3ML SOLN Take 3 mLs by nebulization every 6 (six) hours as needed. Patient taking differently: Take 3 mLs by nebulization every 6 (six) hours as needed (for shortness of breath or wheezing).  12/20/17  Yes Lovenia Kim, MD  isosorbide mononitrate (IMDUR) 60 MG 24 hr tablet Take 1 tablet (60 mg total) by mouth 2 (two) times daily. 10/10/18  Yes Bensimhon, Shaune Pascal, MD  nitroGLYCERIN (NITROSTAT) 0.4 MG SL tablet PLACE 1 TABLET UNDER THE TONGUE EVERY 5 MINUTES FOR 3 DOSES AS NEEDED FOR CHEST PAIN Patient taking differently: Place 0.4 mg under the tongue every 5 (five) minutes as needed for chest pain.  09/16/18  Yes Bensimhon, Shaune Pascal, MD  Oxycodone HCl 10 MG TABS Take 20 mg by mouth every 8 (eight) hours.    Yes [provider]  potassium chloride (KLOR-CON M10) 10 MEQ tablet Take 1 tablet (10 mEq total) by mouth daily. 10/10/18  Yes Bensimhon, Shaune Pascal, MD  sacubitril-valsartan (ENTRESTO) 97-103 MG Take 1 tablet by mouth 2 (two) times daily. 10/10/18  Yes Bensimhon, Shaune Pascal, MD  spironolactone (ALDACTONE) 25 MG tablet Take 0.5 tablets (12.5 mg total) by mouth daily. 10/10/18  Yes Bensimhon, Shaune Pascal, MD  HYDROcodone-acetaminophen (NORCO/VICODIN) 5-325 MG tablet Take 1 tablet by mouth every 6 (six) hours as needed for severe pain. Patient not taking: Reported on 11/26/2018 04/07/18   Carmin Muskrat, MD  MITIGARE 0.6 MG CAPS TAKE 2 CAPSULES BY MOUTH WHEN YOU FEEL AN ATTACK COMING ON, TAKE 1 CAPSULE THE FOLLOWING DAY Patient not taking: Reported on 11/26/2018 08/18/18   Nuala Alpha, DO  traMADol (ULTRAM) 50 MG tablet Take 1 tablet (50 mg total) by mouth every 6 (six) hours as needed. Patient not taking: Reported on 11/26/2018 06/25/18 06/25/19  Ralene Ok, MD    Family  History Family History  Problem Relation Age of Onset  . Hypertension Father   . Heart disease Father   . Early death Father   . COPD  Father   . Hypertension Mother   . Coronary artery disease Unknown     Social History Social History   Tobacco Use  . Smoking status: Never Smoker  . Smokeless tobacco: Never Used  Substance Use Topics  . Alcohol use: No  . Drug use: No     Allergies   Contrast media [iodinated diagnostic agents]; Iohexol; Lipitor [atorvastatin calcium]; Shellfish allergy; Sulfa antibiotics; Sulfonamide derivatives; Almond oil; Metrizamide; Zocor [simvastatin]; Latex; and Peach [prunus persica]   Review of Systems Review of Systems  Cardiovascular: Positive for chest pain.   All other systems reviewed and are negative except that which was mentioned in HPI   Physical Exam Updated Vital Signs BP 92/65 (BP Location: Left Arm)   Pulse 72   Temp 97.9 F (36.6 C) (Oral)   Resp 16   SpO2 97%   Physical Exam  Constitutional: He is oriented to person, place, and time. He appears well-developed and well-nourished. No distress.  HENT:  Head: Normocephalic and atraumatic.  Moist mucous membranes  Eyes: Pupils are equal, round, and reactive to light. Conjunctivae are normal.  Neck: Neck supple.  Cardiovascular: Normal rate and regular rhythm.  Murmur heard. Pulmonary/Chest: Effort normal and breath sounds normal.  Abdominal: Soft. Bowel sounds are normal. He exhibits no distension. There is no tenderness.  Musculoskeletal: He exhibits no edema.  Neurological: He is alert and oriented to person, place, and time.  Fluent speech  Skin: Skin is warm and dry.  Psychiatric: He has a normal mood and affect. Judgment normal.  Nursing note and vitals reviewed.    ED Treatments / Results  Labs (all labs ordered are listed, but only abnormal results are displayed) Labs Reviewed  BASIC METABOLIC PANEL - Abnormal; Notable for the following components:       Result Value   Glucose, Bld 109 (*)    BUN 23 (*)    Creatinine, Ser 1.66 (*)    Calcium 8.5 (*)    GFR calc non Af Amer 46 (*)    GFR calc Af Amer 53 (*)    All other components within normal limits  CBC - Abnormal; Notable for the following components:   WBC 3.5 (*)    Hemoglobin 11.3 (*)    HCT 36.1 (*)    Platelets 107 (*)    All other components within normal limits  PROTIME-INR  I-STAT TROPONIN, ED  I-STAT TROPONIN, ED    EKG EKG Interpretation  Date/Time:  Tuesday November 26 2018 03:51:50 EST Ventricular Rate:  73 PR Interval:    QRS Duration: 123 QT Interval:  414 QTC Calculation: 457 R Axis:   -13 Text Interpretation:  Sinus rhythm Prolonged PR interval Left atrial enlargement Left bundle branch block T wave inversions V4-V6 slightly more pronounced but present on previous tracings Confirmed by Theotis Burrow [phone]) on 11/26/2018 3:58:38 AM   Radiology Dg Chest 2 View  Result Date: 11/26/2018 CLINICAL DATA:  Back pain. History of aortic aneurysm and CHF. A cough for EXAM: CHEST - 2 VIEW COMPARISON:  Chest radiograph Apr 30, 2018 FINDINGS: Stable cardiomegaly. Aneurysmal aorta. Status post status post median sternotomy for CABG. No pleural effusion or focal consolidation. No pneumothorax. LEFT single lead AICD in situ. Surgical clips LEFT neck. IMPRESSION: 1. Stable cardiomegaly.  No acute pulmonary or aneurysm. 2. Stable radiographic appearance of thoracic aortic aneurysm. Electronically Signed   By: Elon Alas M.D.   On: 11/26/2018 04:19    Procedures Procedures (including critical care  time)  Medications Ordered in ED Medications - No data to display   Initial Impression / Assessment and Plan / ED Course  I have reviewed the triage vital signs and the nursing notes.  Pertinent labs & imaging results that were available during my care of the patient were reviewed by me and considered in my medical decision making (see chart for details).    Pt  comfortable on exam. Reassuring VS. EKG without acute ischemic changes. Labs show negative initial troponin, stable Cr 1.66, WBC 3.5, Hgb 11.3. CXR negative acute. Will obtain 3-hour  Troponin and then contact cardiology given patient's extensive cardiac history. I am signing out to oncoming provider pending repeat troponin and cardiology recommendations.   Final Clinical Impressions(s) / ED Diagnoses   Final diagnoses:  None    ED Discharge Orders    None       Analis Distler, Wenda Overland, MD 11/26/18 [phone]

## 2018-11-26 NOTE — ED Triage Notes (Addendum)
Pt from home, was woken up from sleep by chest and back pain. Pt took his home nitro x4 without relief and his doxazosin for hypertension; EMS gave 324mg  ASA, one additional nitro, and 6 mg morphine with some relief. Significant cardiac history; has Chemical engineer pacemaker to L chest (interrogation done on arrival), cardiologist is Dr.Bensimhon. Pt reports pain had improved since morphine

## 2018-11-26 NOTE — ED Notes (Signed)
Pt's IV taken out, advised of risks leaving AMA. Ambulated out of department with visitor

## 2018-11-27 ENCOUNTER — Emergency Department (HOSPITAL_COMMUNITY)
Admission: EM | Admit: 2018-11-27 | Discharge: 2018-11-28 | Disposition: A | Discharge status: Home / Self Care | Payer: Medicaid Other | Attending: Emergency Medicine | Admitting: Emergency Medicine

## 2018-11-27 ENCOUNTER — Emergency Department (HOSPITAL_COMMUNITY): Payer: Medicaid Other

## 2018-11-27 ENCOUNTER — Encounter (HOSPITAL_COMMUNITY): Payer: Self-pay

## 2018-11-27 ENCOUNTER — Other Ambulatory Visit: Payer: Self-pay

## 2018-11-27 DIAGNOSIS — Z85850 Personal history of malignant neoplasm of thyroid: Secondary | ICD-10-CM | POA: Diagnosis not present

## 2018-11-27 DIAGNOSIS — Z95810 Presence of automatic (implantable) cardiac defibrillator: Secondary | ICD-10-CM | POA: Diagnosis not present

## 2018-11-27 DIAGNOSIS — Z79899 Other long term (current) drug therapy: Secondary | ICD-10-CM | POA: Diagnosis not present

## 2018-11-27 DIAGNOSIS — I2510 Atherosclerotic heart disease of native coronary artery without angina pectoris: Secondary | ICD-10-CM | POA: Diagnosis not present

## 2018-11-27 DIAGNOSIS — Z91040 Latex allergy status: Secondary | ICD-10-CM | POA: Insufficient documentation

## 2018-11-27 DIAGNOSIS — I252 Old myocardial infarction: Secondary | ICD-10-CM | POA: Insufficient documentation

## 2018-11-27 DIAGNOSIS — R0789 Other chest pain: Secondary | ICD-10-CM | POA: Insufficient documentation

## 2018-11-27 DIAGNOSIS — K8020 Calculus of gallbladder without cholecystitis without obstruction: Secondary | ICD-10-CM | POA: Diagnosis not present

## 2018-11-27 DIAGNOSIS — I10 Essential (primary) hypertension: Secondary | ICD-10-CM | POA: Diagnosis not present

## 2018-11-27 DIAGNOSIS — R1011 Right upper quadrant pain: Secondary | ICD-10-CM | POA: Insufficient documentation

## 2018-11-27 DIAGNOSIS — R079 Chest pain, unspecified: Secondary | ICD-10-CM

## 2018-11-27 DIAGNOSIS — I509 Heart failure, unspecified: Secondary | ICD-10-CM | POA: Insufficient documentation

## 2018-11-27 LAB — COMPREHENSIVE METABOLIC PANEL
ALT: 18 U/L (ref 0–44)
AST: 29 U/L (ref 15–41)
Albumin: 3.8 g/dL (ref 3.5–5.0)
Alkaline Phosphatase: 85 U/L (ref 38–126)
Anion gap: 9 (ref 5–15)
BUN: 26 mg/dL — ABNORMAL HIGH (ref 6–20)
CO2: 26 mmol/L (ref 22–32)
Calcium: 8.7 mg/dL — ABNORMAL LOW (ref 8.9–10.3)
Chloride: 105 mmol/L (ref 98–111)
Creatinine, Ser: 1.59 mg/dL — ABNORMAL HIGH (ref 0.61–1.24)
GFR calc Af Amer: 56 mL/min — ABNORMAL LOW (ref >60)
GFR calc non Af Amer: 48 mL/min — ABNORMAL LOW (ref >60)
Glucose, Bld: 106 mg/dL — ABNORMAL HIGH (ref 70–99)
Potassium: 4.1 mmol/L (ref 3.5–5.1)
Sodium: 140 mmol/L (ref 135–145)
Total Bilirubin: 0.8 mg/dL (ref 0.3–1.2)
Total Protein: 6.5 g/dL (ref 6.5–8.1)

## 2018-11-27 LAB — CBC WITH DIFFERENTIAL/PLATELET
Abs Immature Granulocytes: 0 10*3/uL (ref 0.00–0.07)
Basophils Absolute: 0 10*3/uL (ref 0.0–0.1)
Basophils Relative: 0 %
Eosinophils Absolute: 0.1 10*3/uL (ref 0.0–0.5)
Eosinophils Relative: 4 %
HCT: 39.8 % (ref 39.0–52.0)
Hemoglobin: 12 g/dL — ABNORMAL LOW (ref 13.0–17.0)
Immature Granulocytes: 0 %
Lymphocytes Relative: 29 %
Lymphs Abs: 0.9 10*3/uL (ref 0.7–4.0)
MCH: 25.5 pg — ABNORMAL LOW (ref 26.0–34.0)
MCHC: 30.2 g/dL (ref 30.0–36.0)
MCV: 84.5 fL (ref 80.0–100.0)
Monocytes Absolute: 0.4 10*3/uL (ref 0.1–1.0)
Monocytes Relative: 12 %
Neutro Abs: 1.7 10*3/uL (ref 1.7–7.7)
Neutrophils Relative %: 55 %
Platelets: 116 10*3/uL — ABNORMAL LOW (ref 150–400)
RBC: 4.71 MIL/uL (ref 4.22–5.81)
RDW: 14.5 % (ref 11.5–15.5)
WBC: 3.2 10*3/uL — ABNORMAL LOW (ref 4.0–10.5)
nRBC: 0 % (ref 0.0–0.2)

## 2018-11-27 LAB — URINALYSIS, ROUTINE W REFLEX MICROSCOPIC
Bilirubin Urine: NEGATIVE
Glucose, UA: NEGATIVE mg/dL
Hgb urine dipstick: NEGATIVE
Ketones, ur: NEGATIVE mg/dL
Leukocytes, UA: NEGATIVE
Nitrite: NEGATIVE
Protein, ur: NEGATIVE mg/dL
Specific Gravity, Urine: 1.009 (ref 1.005–1.030)
pH: 5 (ref 5.0–8.0)

## 2018-11-27 LAB — TROPONIN I: Troponin I: <0.03 ng/mL (ref <0.03)

## 2018-11-27 LAB — LIPASE, BLOOD: Lipase: 26 U/L (ref 11–51)

## 2018-11-27 NOTE — ED Notes (Signed)
Lab to add on troponin  

## 2018-11-27 NOTE — ED Provider Notes (Signed)
[facility] EMERGENCY DEPARTMENT Provider Note   CSN: [id number] Arrival date & time: 11/27/18  1931     History   Chief Complaint Chief Complaint  Patient presents with  . Chest Pain    HPI Chad [name]. is a 57 y.o. male.  HPI Patient presents to the emergency department with chest pain that started around 6 PM.  Patient states that he was recently seen in the emergency department but left AMA and went and saw his cardiologist.  The patient states that this felt like his previous MIs.  Patient states he took nitroglycerin at home.  Patient states that nothing seemed to make the condition better or worse.  The patient denies  headache,blurred vision, neck pain, fever, cough, weakness, numbness, dizziness, anorexia, edema, abdominal pain, nausea, vomiting, diarrhea, rash, back pain, dysuria, hematemesis, bloody stool, near syncope, or syncope. Past Medical History:  Diagnosis Date  . AICD (automatic cardioverter/defibrillator) present   . Anemia   . Anginal pain ([place])   . Aortic dissection, thoracoabdominal ([place])    7/10: Type I s/p repair  . Arthritis   . Asthma   . CAD (coronary artery disease)    a. s/p CABG 2006;  b. DES to PDA 2011 (cath: Dx not seen, dRCA/PDA tx with DES; S-PDA occluded (culprit), S-Dx occluded, S-RI and OM ok, L-LAD ok  . Carotid stenosis    dopplers 2011: 0-39% bilat.  . Chest pain syndrome   . CHF (congestive heart failure) ([place])   . Chronic bronchitis ([place])   . Chronic lower back pain   . Chronic systolic heart failure (HCC)    a. 12/13 ECHO: EF 35-40%, sept, apical & posterobasal HK, LV mod dil & sys fx mod reduced, mild AI, MV mild reg, TV mild reg  . Complication of anesthesia    "difficult to wake afterwards a couple of times"  . 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 Dyspnea   . Family history of adverse reaction to anesthesia    "sister hard to wake up"    . GERD (gastroesophageal reflux disease)   . Gout   . Heart murmur   . HLD (hyperlipidemia)   . HTN (hypertension)    severe  . Myocardial infarction [facility])    "many" (11/07/2016)  . PONV (postoperative nausea and vomiting)   . Sleep apnea    "never RX'd mask" (11/07/2016)  . Thyroid cancer ([place])    [employer]    Patient Active Problem List   Diagnosis Date Noted  . Pain in joint of left shoulder 07/05/2018  . 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 Hip region mass, left 06/24/2018  . Acute idiopathic gout of right foot 05/22/2018  . Inguinal hernia 02/18/2018  . Prediabetes 12/20/2017  . Renal insufficiency   . Hypokalemia   . CHF exacerbation ([place]) 12/15/2017  . Failure of implantable cardioverter-defibrillator (ICD) lead 11/07/2016  . Dyspnea 05/12/2016  . Erectile dysfunction 04/18/2016  . Pain in the chest   . Aneurysm of descending thoracic aorta ([place])   . Nasal folliculitis 06/23/1600  . Chronic systolic CHF (congestive heart failure) ([place]) 04/12/2015  . CKD (chronic kidney disease) stage 3, GFR 30-59 ml/min (HCC) 04/12/2015  . Unstable angina ([place]) 01/25/2015  . Chest pain 10/10/2014  . Health care maintenance 03/19/2014  . Local reaction to tetanus vaccine 03/19/2014  .  Neck pain on left side 06/03/2013  . Penile discharge 02/26/2013  . Syncope 01/17/2013  . Chronic combined systolic and diastolic CHF (congestive heart failure) ([place]) 01/16/2013  . Low back pain 04/23/2012  . Chest pain syndrome   . Insomnia 09/14/2011  . 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 Coronary atherosclerosis of native coronary artery 06/12/2011  . Bradycardia 05/03/2011  . Aortic dissection, thoracoabdominal ([place])   . Chronic kidney disease (CKD), stage II (mild)   . ORTHOSTATIC HYPOTENSION 05/03/2010  . CAROTID BRUIT 01/25/2010  . PANCYTOPENIA 11/01/2009  . Fatigue 10/25/2009  . DISSECTING  AORTIC ANEURYSM THORACOABDOMINAL 07/14/2009  . SCIATICA, RIGHT 05/18/2009  . HYPERLIPIDEMIA-MIXED 09/29/2008  . HYPERTENSION, BENIGN 09/29/2008  . CAD, ARTERY BYPASS GRAFT 09/29/2008  . SYSTOLIC HEART FAILURE, CHRONIC 09/29/2008  . ICD - IN SITU 09/29/2008    Past Surgical History:  Procedure Laterality Date  . BACK SURGERY    . CARDIAC CATHETERIZATION     "several" (11/07/2016)  . CARDIAC DEFIBRILLATOR PLACEMENT  11/2005   [employer]; Archie Endo 05/09/2011  . CORONARY ANGIOPLASTY WITH STENT PLACEMENT     "I've had 1-2 stents" (11/07/2016)  . CORONARY ARTERY BYPASS GRAFT  06/21/2009   "CABG X2"  . CORONARY ARTERY BYPASS GRAFT  06/29/2005   "CABG X7"  . ICD LEAD REMOVAL  11/07/2016  . ICD LEAD REMOVAL N/A 11/07/2016   Procedure: ICD LEAD REMOVAL, INSERTION OF NEW ICD LEAD;  Surgeon: Evans Lance, MD;  Location: [facility];  Service: Cardiovascular;  Laterality: N/A;  Dr. Prescott Gum to backup case  . IMPLANTABLE CARDIOVERTER DEFIBRILLATOR (ICD) GENERATOR CHANGE N/A 12/13/2012   Procedure: ICD GENERATOR CHANGE;  Surgeon: Evans Lance, MD;  Location: [facility] CATH LAB;  Service: Cardiovascular;  Laterality: N/A;  . INGUINAL HERNIA REPAIR Bilateral 06/25/2018   Procedure: LAPAROSCOPIC  BILATERAL INGUINAL HERNIA REPAIRS;  Surgeon: Ralene Ok, MD;  Location: [facility];  Service: General;  Laterality: Bilateral;  . INSERTION OF MESH Bilateral 06/25/2018   Procedure: INSERTION OF MESH;  Surgeon: Ralene Ok, MD;  Location: [facility];  Service: General;  Laterality: Bilateral;  . [place] SURGERY  01/2001    most recent within 5-10 years  . RIGHT HEART CATH N/A 04/05/2018   Procedure: RIGHT HEART CATH;  Surgeon: Jolaine Artist, MD;  Location: [facility] CV LAB;  Service: Cardiovascular;  Laterality: N/A;  . SHOULDER ARTHROSCOPY WITH ROTATOR CUFF REPAIR Bilateral   . Status post emergency repair of a type A ascending aortic dissection with a hemiarch reconstruction of the ascending aorta   using a 28-mm Hemashield graft with redo sternotomy and revision of previous bypass grafts in June 2010.    . TESTICLE SURGERY    . THYROIDECTOMY, PARTIAL  06/20/2011  . TONSILLECTOMY    . UMBILICAL HERNIA REPAIR N/A 06/25/2018   Procedure: UMBILICAL HERNIA REPAIR;  Surgeon: Ralene Ok, MD;  Location: [facility];  Service: General;  Laterality: N/A;  . VASECTOMY          Home Medications    Prior to Admission medications   Medication Sig Start Date End Date Taking? Authorizing Provider  albuterol (PROVENTIL HFA;VENTOLIN HFA) 108 (90 Base) MCG/ACT inhaler Inhale 2 puffs into the lungs every 6 (six) hours as needed for wheezing or shortness of breath. 10/19/17   Tonette Bihari, MD  aspirin EC 81 MG tablet Take 1 tablet (81 mg total) by mouth daily. Can take 4 tabs (324 mg total) as needed for chest  pain. DO NOT TAKE multiple 325 mg tabs. 05/01/18   Shirley Friar, PA-C  B Complex-C (SUPER B COMPLEX PO) Take 3 tablets by mouth daily.     [provider]  carvedilol (COREG) 25 MG tablet TAKE 1 TABLET BY MOUTH TWICE DAILY WITH  A  MEAL Patient taking differently: Take 25 mg by mouth 2 (two) times daily with a meal.  10/10/18   Bensimhon, Shaune Pascal, MD  clopidogrel (PLAVIX) 75 MG tablet Take 1 tablet (75 mg total) by mouth daily. 10/10/18   Bensimhon, Shaune Pascal, MD  doxazosin (CARDURA) 1 MG tablet Take 1 tablet (1 mg total) by mouth every 12 (twelve) hours as needed (for systolic BP > 502 mm Hg.). 10/10/18   Bensimhon, Shaune Pascal, MD  fluticasone (FLONASE) 50 MCG/ACT nasal spray Place 2 sprays into both nostrils daily as needed for allergies or rhinitis.     [provider]  furosemide (LASIX) 80 MG tablet Take 1 tablet (80 mg total) by mouth 2 (two) times daily. 10/10/18   Bensimhon, Shaune Pascal, MD  hydrALAZINE (APRESOLINE) 50 MG tablet Take 1.5 tablets (75 mg total) by mouth 2 (two) times daily. 10/10/18   Bensimhon, Shaune Pascal, MD  HYDROcodone-acetaminophen  (NORCO/VICODIN) 5-325 MG tablet Take 1 tablet by mouth every 6 (six) hours as needed for severe pain. Patient not taking: Reported on 11/26/2018 04/07/18   Carmin Muskrat, MD  ipratropium-albuterol (DUONEB) 0.5-2.5 (3) MG/3ML SOLN Take 3 mLs by nebulization every 6 (six) hours as needed. Patient taking differently: Take 3 mLs by nebulization every 6 (six) hours as needed (for shortness of breath or wheezing).  12/20/17   Lovenia Kim, MD  isosorbide mononitrate (IMDUR) 60 MG 24 hr tablet Take 1 tablet (60 mg total) by mouth 2 (two) times daily. 10/10/18   Bensimhon, Shaune Pascal, MD  MITIGARE 0.6 MG CAPS TAKE 2 CAPSULES BY MOUTH WHEN YOU FEEL AN ATTACK COMING ON, TAKE 1 CAPSULE THE FOLLOWING DAY Patient not taking: Reported on 11/26/2018 08/18/18   Nuala Alpha, DO  nitroGLYCERIN (NITROSTAT) 0.4 MG SL tablet PLACE 1 TABLET UNDER THE TONGUE EVERY 5 MINUTES FOR 3 DOSES AS NEEDED FOR CHEST PAIN Patient taking differently: Place 0.4 mg under the tongue every 5 (five) minutes as needed for chest pain.  09/16/18   Bensimhon, Shaune Pascal, MD  Oxycodone HCl 10 MG TABS Take 20 mg by mouth every 8 (eight) hours.     [provider]  potassium chloride (KLOR-CON M10) 10 MEQ tablet Take 1 tablet (10 mEq total) by mouth daily. 10/10/18   Bensimhon, Shaune Pascal, MD  sacubitril-valsartan (ENTRESTO) 97-103 MG Take 1 tablet by mouth 2 (two) times daily. 10/10/18   Bensimhon, Shaune Pascal, MD  spironolactone (ALDACTONE) 25 MG tablet Take 0.5 tablets (12.5 mg total) by mouth daily. 10/10/18   Bensimhon, Shaune Pascal, MD  traMADol (ULTRAM) 50 MG tablet Take 1 tablet (50 mg total) by mouth every 6 (six) hours as needed. Patient not taking: Reported on 11/26/2018 06/25/18 06/25/19  Ralene Ok, MD    Family History Family History  Problem Relation Age of Onset  . Hypertension Father   . Heart disease Father   . Early death Father   . COPD Father   . Hypertension Mother   . Coronary artery disease Unknown     Social  History Social History   Tobacco Use  . Smoking status: Never Smoker  . Smokeless tobacco: Never Used  Substance Use Topics  . Alcohol use:  No  . Drug use: No     Allergies   Contrast media [iodinated diagnostic agents]; Iohexol; Lipitor [atorvastatin calcium]; Shellfish allergy; Sulfa antibiotics; Sulfonamide derivatives; Almond oil; Metrizamide; Zocor [simvastatin]; Latex; and Peach [prunus persica]   Review of Systems Review of Systems All other systems negative except as documented in the HPI. All pertinent positives and negatives as reviewed in the HPI.  Physical Exam Updated Vital Signs BP 114/68   Pulse 66   Temp (!) 97.5 F (36.4 C) (Oral)   Resp 16   Ht 5\' 11"  (1.803 m)   Wt 72.6 kg   SpO2 96%   BMI 22.32 kg/m   Physical Exam  Constitutional: He is oriented to person, place, and time. He appears well-developed and well-nourished. No distress.  HENT:  Head: Normocephalic and atraumatic.  Mouth/Throat: Oropharynx is clear and moist.  Eyes: Pupils are equal, round, and reactive to light.  Neck: Normal range of motion. Neck supple.  Cardiovascular: Normal rate, regular rhythm and normal heart sounds. Exam reveals no gallop and no friction rub.  No murmur heard. Pulmonary/Chest: Effort normal and breath sounds normal. No respiratory distress. He has no decreased breath sounds. He has no wheezes.  Abdominal: Soft. Bowel sounds are normal. He exhibits no distension. There is tenderness in the right upper quadrant.  Neurological: He is alert and oriented to person, place, and time. He exhibits normal muscle tone. Coordination normal.  Skin: Skin is warm and dry. Capillary refill takes less than 2 seconds. No rash noted. No erythema.  Psychiatric: He has a normal mood and affect. His behavior is normal.  Nursing note and vitals reviewed.    ED Treatments / Results  Labs (all labs ordered are listed, but only abnormal results are displayed) Labs Reviewed    COMPREHENSIVE METABOLIC PANEL - Abnormal; Notable for the following components:      Result Value   Glucose, Bld 106 (*)    BUN 26 (*)    Creatinine, Ser 1.59 (*)    Calcium 8.7 (*)    GFR calc non Af Amer 48 (*)    GFR calc Af Amer 56 (*)    All other components within normal limits  CBC WITH DIFFERENTIAL/PLATELET - Abnormal; Notable for the following components:   WBC 3.2 (*)    Hemoglobin 12.0 (*)    MCH 25.5 (*)    Platelets 116 (*)    All other components within normal limits  LIPASE, BLOOD  URINALYSIS, ROUTINE W REFLEX MICROSCOPIC  TROPONIN I    EKG None  Radiology Dg Chest 2 View  Result Date: 11/27/2018 CLINICAL DATA:  Chest pain. EXAM: CHEST - 2 VIEW COMPARISON:  Multiple chest x-rays since April 20, 2018 FINDINGS: The patient's known tortuous aneurysmal thoracic aorta is stable on this x-ray. No pneumothorax. The lungs remain clear. Stable AICD device. Stable cardiomegaly. IMPRESSION: The patient's known thoracic aortic aneurysm is stable based on this x-ray. If there is concern for dissection, a CTA would be much more sensitive. No interval changes on this x-ray. Electronically Signed   By: Dorise Bullion III M.D   On: 11/27/2018 21:48   Dg Chest 2 View  Result Date: 11/26/2018 CLINICAL DATA:  Back pain. History of aortic aneurysm and CHF. A cough for EXAM: CHEST - 2 VIEW COMPARISON:  Chest radiograph Apr 30, 2018 FINDINGS: Stable cardiomegaly. Aneurysmal aorta. Status post status post median sternotomy for CABG. No pleural effusion or focal consolidation. No pneumothorax. LEFT single lead AICD in  situ. Surgical clips LEFT neck. IMPRESSION: 1. Stable cardiomegaly.  No acute pulmonary or aneurysm. 2. Stable radiographic appearance of thoracic aortic aneurysm. Electronically Signed   By: Elon Alas M.D.   On: 11/26/2018 04:19    Procedures Procedures (including critical care time)  Medications Ordered in ED Medications - No data to display   Initial  Impression / Assessment and Plan / ED Course  I have reviewed the triage vital signs and the nursing notes.  Pertinent labs & imaging results that were available during my care of the patient were reviewed by me and considered in my medical decision making (see chart for details).    Patient does have right upper quadrant abdominal pain on examination.  He states he was having that pain earlier as well.  He had drawn marks on his body where the pain was occurring.  He states that the pain was mid chest and seem to radiate down to this area in the abdomen.  Final Clinical Impressions(s) / ED Diagnoses   Final diagnoses:  RUQ pain    ED Discharge Orders    None       Dalia Heading, PA-C 11/27/18 [place], [place], DO 11/27/18 2353

## 2018-11-27 NOTE — ED Triage Notes (Signed)
Pt from home reports centralized chest pain radiating to back around 1800. Pt reports it was 10/10 and took 5 NTG and 650 mg of ASA prior to EMS arrival. Pt BP with EMS was 140/80. EMS gave 2 NTG and 6 of morphine en route. Pain is now 3/10. Pt hx of CABG/MI. Pt reports it initially felt like previous MI.

## 2018-11-27 NOTE — Telephone Encounter (Signed)
Called pt to inform the labs were ordered by his cardiologist. Pt then wanted to know results of [place] from back in October, results looked fine. I informed pt of this, he still wants to know if he has cancer? He has an apt with pcp 12/20. Pt wanted to be seen as soon as possible to discuss?   I scheduled him with [facility] on Friday. Soonest available and pt seems to think he has cancer and is worried. Pt wants reassurance.

## 2018-11-28 LAB — I-STAT TROPONIN, ED: Troponin i, poc: 0.01 ng/mL (ref 0.00–0.08)

## 2018-11-28 MED ORDER — DICYCLOMINE HCL 20 MG PO TABS: 20.0000 mg | ORAL_TABLET | Freq: Two times a day (BID) | ORAL | 0 refills | Status: DC

## 2018-11-28 MED ORDER — ONDANSETRON 4 MG PO TBDP: 4.0000 mg | ORAL_TABLET | Freq: Three times a day (TID) | ORAL | 0 refills | Status: DC | PRN

## 2018-11-28 NOTE — ED Notes (Signed)
ED Provider at bedside. 

## 2018-11-28 NOTE — ED Provider Notes (Signed)
Assumed care from [place] at shift change.  See prior notes for ful H&P.  Briefly, 57 y.o. M here with chest/RUQ pain since around [place].  Seen recently in the ED but ultimately left AMA.  .  Labs overall reassuring.  CXR clear.  [place] RUQ with some gallstones but no findings of acute cholecysitits.  Does have mildly dilated CBD, possibly recently passed stone.  No signs of acute obstructions.  Patient's LFT's, alk phos, and bili are all WNL.  Has had some issues with his gallbladder previously.  Plan:  Delta troponin at 0130.  If negative, can d/c home with PCP and general surgery follow-up.  Results for orders placed or performed during the hospital encounter of 11/27/18  Comprehensive metabolic panel  Result Value Ref Range   Sodium 140 135 - 145 mmol/L   Potassium 4.1 3.5 - 5.1 mmol/L   Chloride 105 98 - 111 mmol/L   CO2 26 22 - 32 mmol/L   Glucose, Bld 106 (H) 70 - 99 mg/dL   BUN 26 (H) 6 - 20 mg/dL   Creatinine, Ser 1.59 (H) 0.61 - 1.24 mg/dL   Calcium 8.7 (L) 8.9 - 10.3 mg/dL   Total Protein 6.5 6.5 - 8.1 g/dL   Albumin 3.8 3.5 - 5.0 g/dL   AST 29 15 - 41 U/L   ALT 18 0 - 44 U/L   Alkaline Phosphatase 85 38 - 126 U/L   Total Bilirubin 0.8 0.3 - 1.2 mg/dL   GFR calc non Af Amer 48 (L) >60 mL/min   GFR calc Af Amer 56 (L) >60 mL/min   Anion gap 9 5 - 15  Lipase, blood  Result Value Ref Range   Lipase 26 11 - 51 U/L  CBC with Differential  Result Value Ref Range   WBC 3.2 (L) 4.0 - 10.5 K/uL   RBC 4.71 4.22 - 5.81 MIL/uL   Hemoglobin 12.0 (L) 13.0 - 17.0 g/dL   HCT 39.8 39.0 - 52.0 %   MCV 84.5 80.0 - 100.0 fL   MCH 25.5 (L) 26.0 - 34.0 pg   MCHC 30.2 30.0 - 36.0 g/dL   RDW 14.5 11.5 - 15.5 %   Platelets 116 (L) 150 - 400 K/uL   nRBC 0.0 0.0 - 0.2 %   Neutrophils Relative % 55 %   Neutro Abs 1.7 1.7 - 7.7 K/uL   Lymphocytes Relative 29 %   Lymphs Abs 0.9 0.7 - 4.0 K/uL   Monocytes Relative 12 %   Monocytes Absolute 0.4 0.1 - 1.0 K/uL   Eosinophils Relative 4 %   Eosinophils Absolute 0.1 0.0 - 0.5 K/uL   Basophils Relative 0 %   Basophils Absolute 0.0 0.0 - 0.1 K/uL   Immature Granulocytes 0 %   Abs Immature Granulocytes 0.00 0.00 - 0.07 K/uL  Urinalysis, Routine w reflex microscopic  Result Value Ref Range   Color, Urine YELLOW YELLOW   APPearance CLEAR CLEAR   Specific Gravity, Urine 1.009 1.005 - 1.030   pH 5.0 5.0 - 8.0   Glucose, UA NEGATIVE NEGATIVE mg/dL   Hgb urine dipstick NEGATIVE NEGATIVE   Bilirubin Urine NEGATIVE NEGATIVE   Ketones, ur NEGATIVE NEGATIVE mg/dL   Protein, ur NEGATIVE NEGATIVE mg/dL   Nitrite NEGATIVE NEGATIVE   Leukocytes, UA NEGATIVE NEGATIVE  Troponin I - ONCE - STAT  Result Value Ref Range   Troponin I <0.03 <0.03 ng/mL  I-stat troponin, ED  Result Value Ref Range   Troponin i,  poc 0.01 0.00 - 0.08 ng/mL   Comment 3           Dg Chest 2 View  Result Date: 11/27/2018 CLINICAL DATA:  Chest pain. EXAM: CHEST - 2 VIEW COMPARISON:  Multiple chest x-rays since April 20, 2018 FINDINGS: The patient's known tortuous aneurysmal thoracic aorta is stable on this x-ray. No pneumothorax. The lungs remain clear. Stable AICD device. Stable cardiomegaly. IMPRESSION: The patient's known thoracic aortic aneurysm is stable based on this x-ray. If there is concern for dissection, a CTA would be much more sensitive. No interval changes on this x-ray. Electronically Signed   By: Dorise Bullion III M.D   On: 11/27/2018 21:48   Dg Chest 2 View  Result Date: 11/26/2018 CLINICAL DATA:  Back pain. History of aortic aneurysm and CHF. A cough for EXAM: CHEST - 2 VIEW COMPARISON:  Chest radiograph Apr 30, 2018 FINDINGS: Stable cardiomegaly. Aneurysmal aorta. Status post status post median sternotomy for CABG. No pleural effusion or focal consolidation. No pneumothorax. LEFT single lead AICD in situ. Surgical clips LEFT neck. IMPRESSION: 1. Stable cardiomegaly.  No acute pulmonary or aneurysm. 2. Stable radiographic appearance of thoracic  aortic aneurysm. Electronically Signed   By: Elon Alas M.D.   On: 11/26/2018 04:19   US Abdomen Limited Ruq  Result Date: 11/27/2018 CLINICAL DATA:  RIGHT upper quadrant pain. EXAM: ULTRASOUND ABDOMEN LIMITED RIGHT UPPER QUADRANT COMPARISON:  None. FINDINGS: Gallbladder: Multiple echogenic dependent gallstones measuring to 4 mm with acoustic shadowing. Top-normal gallbladder wall thickness at 3 mm without pericholecystic fluid. No sonographic Murphy sign elicited. Common bile duct: Diameter: 8-9 mm. Liver: No focal lesion identified. Within normal limits in parenchymal echogenicity. Portal vein is patent on color Doppler imaging with normal direction of blood flow towards the liver. IMPRESSION: 1. Cholelithiasis without sonographic findings of acute cholecystitis. 2. Dilated common bile duct, potentially from recently passed stone. Electronically Signed   By: Elon Alas M.D.   On: 11/27/2018 23:51    Delta trop is negative.  VSS.  Ambulatory in the ED, tolerating PO.  Patient is concerned about pain at home-- he is enrolled in pain clinic, currently on 78m oxycodone Q4-6H. we have discussed that providing further narcotics will invalidate his pain contract, will add Bentyl and Zofran for symptomatic care.  He is encouraged to follow-up with general surgery as well as his primary care doctor.  He can return here for any new or worsening symptoms.   SLarene Pickett PA-C 11/28/18 0Rae Lips   IDuffy Bruce MD 11/28/18 1Dorthula Perfect

## 2018-11-28 NOTE — Discharge Instructions (Signed)
Take the prescribed medication as directed. Follow-up with general surgery clinic-- call in the morning for appt. Return to the ED for new or worsening symptoms.

## 2018-11-28 NOTE — ED Notes (Signed)
Patient verbalizes understanding of discharge instructions. Opportunity for questioning and answers were provided. Armband removed by staff, pt discharged from ED. Pt ambulatory to lobby.  

## 2018-11-29 ENCOUNTER — Encounter: Payer: Self-pay | Admitting: Family Medicine

## 2018-11-29 ENCOUNTER — Other Ambulatory Visit: Payer: Self-pay

## 2018-11-29 ENCOUNTER — Emergency Department (HOSPITAL_COMMUNITY)
Admission: EM | Admit: 2018-11-29 | Discharge: 2018-11-29 | Discharge status: Against Medical Advice | Payer: Medicaid Other | Attending: Emergency Medicine | Admitting: Emergency Medicine

## 2018-11-29 ENCOUNTER — Ambulatory Visit: Payer: Medicaid Other | Admitting: Family Medicine

## 2018-11-29 ENCOUNTER — Encounter (HOSPITAL_COMMUNITY): Payer: Self-pay

## 2018-11-29 VITALS — BP 102/60 | HR 98 | Temp 98.0°F | Ht 71.0 in | Wt 169.4 lb

## 2018-11-29 DIAGNOSIS — Z5321 Procedure and treatment not carried out due to patient leaving prior to being seen by health care provider: Secondary | ICD-10-CM | POA: Insufficient documentation

## 2018-11-29 DIAGNOSIS — R1084 Generalized abdominal pain: Secondary | ICD-10-CM | POA: Diagnosis not present

## 2018-11-29 DIAGNOSIS — R109 Unspecified abdominal pain: Secondary | ICD-10-CM

## 2018-11-29 DIAGNOSIS — R2242 Localized swelling, mass and lump, left lower limb: Secondary | ICD-10-CM

## 2018-11-29 DIAGNOSIS — R52 Pain, unspecified: Secondary | ICD-10-CM | POA: Diagnosis not present

## 2018-11-29 DIAGNOSIS — R1010 Upper abdominal pain, unspecified: Secondary | ICD-10-CM | POA: Diagnosis not present

## 2018-11-29 DIAGNOSIS — R1011 Right upper quadrant pain: Secondary | ICD-10-CM | POA: Diagnosis not present

## 2018-11-29 LAB — COMPREHENSIVE METABOLIC PANEL
ALT: 28 U/L (ref 0–44)
ANION GAP: 9 (ref 5–15)
AST: 46 U/L — AB (ref 15–41)
Albumin: 4.1 g/dL (ref 3.5–5.0)
Alkaline Phosphatase: 102 U/L (ref 38–126)
BUN: 30 mg/dL — ABNORMAL HIGH (ref 6–20)
CO2: 31 mmol/L (ref 22–32)
Calcium: 9.1 mg/dL (ref 8.9–10.3)
Chloride: 102 mmol/L (ref 98–111)
Creatinine, Ser: 1.9 mg/dL — ABNORMAL HIGH (ref 0.61–1.24)
GFR calc Af Amer: 45 mL/min — ABNORMAL LOW (ref >60)
GFR calc non Af Amer: 39 mL/min — ABNORMAL LOW (ref >60)
Glucose, Bld: 119 mg/dL — ABNORMAL HIGH (ref 70–99)
Potassium: 4.1 mmol/L (ref 3.5–5.1)
Sodium: 142 mmol/L (ref 135–145)
Total Bilirubin: 0.7 mg/dL (ref 0.3–1.2)
Total Protein: 6.9 g/dL (ref 6.5–8.1)

## 2018-11-29 LAB — CBC
HCT: 41.3 % (ref 39.0–52.0)
Hemoglobin: 12.7 g/dL — ABNORMAL LOW (ref 13.0–17.0)
MCH: 26.2 pg (ref 26.0–34.0)
MCHC: 30.8 g/dL (ref 30.0–36.0)
MCV: 85.2 fL (ref 80.0–100.0)
Platelets: 134 10*3/uL — ABNORMAL LOW (ref 150–400)
RBC: 4.85 MIL/uL (ref 4.22–5.81)
RDW: 14.5 % (ref 11.5–15.5)
WBC: 3.1 10*3/uL — ABNORMAL LOW (ref 4.0–10.5)
nRBC: 0 % (ref 0.0–0.2)

## 2018-11-29 LAB — LIPASE, BLOOD: Lipase: 29 U/L (ref 11–51)

## 2018-11-29 MED ORDER — URSODIOL 500 MG PO TABS: 500.0000 mg | ORAL_TABLET | Freq: Two times a day (BID) | ORAL | 0 refills | Status: DC

## 2018-11-29 NOTE — ED Triage Notes (Signed)
Pt c/o abdominal pain. States he was here on 12/4 with the same symptoms. Known Galbladder issues

## 2018-11-29 NOTE — ED Notes (Addendum)
Patient Left AMA. Pt left Hospital with IV access. Pt contacted via phone number listed on face sheet  and asked to return to have IV removed. Patient refused stated "I aint going to do that". And hung up.

## 2018-11-29 NOTE — ED Provider Notes (Signed)
Patient eloped prior to my evaluation   Cardama, Grayce Sessions, MD 11/29/18 [phone]

## 2018-11-29 NOTE — Patient Instructions (Addendum)
It was nice meeting you today Chad [name]!  For the area on your hip, we will get an ultrasound and depending on those results, I will order an MRI.  For your gallbladder pain, I am prescribing ursodiol to take twice daily.  This medicine helps breakdown gallstones and sludge.  I will also fax my note to [facility] to help get you in sooner with them if possible.  Eat foods low in fat to help reduce your symptoms.  If you have any questions or concerns, please feel free to call the clinic.   Be well,  Dr. Shan Levans

## 2018-11-29 NOTE — Progress Notes (Signed)
Subjective:    Chad [name]. - 57 y.o. male MRN [id number]  Date of birth: Jul 30, 1961  CC   Chad [name]. is here for RUQ pain and painful mass on left hip.  HPI  RUQ pain -Started on 12/4 -Pain is intermittent and sharp in nature, says that he was 15 minutes late to his appointment today because of his pain -No associated nausea, vomiting, diarrhea, or constipation -Has had RUQ pain in the past as well -No triggers that he knows of -Has gone to the emergency department and has been thoroughly evaluated with labs and RUQ ultrasound and found to have gallstones without cholecystitis or choledocholithiasis -Has called surgery to set up an appointment to be evaluated, but they do not have any available appointments for over a month -Has called them during this acute episode of pain as well and they have not been able to get him in any earlier -Says that he "eats like a bird" and usually eats salads and grilled chicken  Mass on left hip -Has been present since last year -Saw Dr. Garlan Fillers in June 2019 for this, and an x-ray was obtained, which did not comment on a mass -Is not painful except when he is sleeping on it -Thinks that it is getting larger and is worried that it is cancerous because he has a history of thyroid cancer -Denies weight loss, lack of appetite, swollen nodes in his axillae and groin, although he does endorse infrequent night sweats   Health Maintenance:  Health Maintenance Due  Topic Date Due  . COLONOSCOPY  12/31/2012  . INFLUENZA VACCINE  07/25/2018    -  reports that he has never smoked. He has never used smokeless tobacco. - Review of Systems: Per HPI. - Past Medical History: Patient Active Problem List   Diagnosis Date Noted  . Colicky RUQ abdominal pain 11/30/2018  . Pain in joint of left shoulder 07/05/2018  . 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 Hip region mass, left 06/24/2018  .  Acute idiopathic gout of right foot 05/22/2018  . Inguinal hernia 02/18/2018  . Prediabetes 12/20/2017  . Renal insufficiency   . Hypokalemia   . CHF exacerbation ([place]) 12/15/2017  . Failure of implantable cardioverter-defibrillator (ICD) lead 11/07/2016  . Dyspnea 05/12/2016  . Erectile dysfunction 04/18/2016  . Pain in the chest   . Aneurysm of descending thoracic aorta ([place])   . Nasal folliculitis 99/83/3825  . Chronic systolic CHF (congestive heart failure) ([place]) 04/12/2015  . CKD (chronic kidney disease) stage 3, GFR 30-59 ml/min (HCC) 04/12/2015  . Unstable angina ([place]) 01/25/2015  . Chest pain 10/10/2014  . Health care maintenance 03/19/2014  . Local reaction to tetanus vaccine 03/19/2014  . Neck pain on left side 06/03/2013  . Penile discharge 02/26/2013  . Syncope 01/17/2013  . Chronic combined systolic and diastolic CHF (congestive heart failure) ([place]) 01/16/2013  . Low back pain 04/23/2012  . Chest pain syndrome   . Insomnia 09/14/2011  . 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 Coronary atherosclerosis of native coronary artery 06/12/2011  . Bradycardia 05/03/2011  . Aortic dissection, thoracoabdominal ([place])   . Chronic kidney disease (CKD), stage II (mild)   . ORTHOSTATIC HYPOTENSION 05/03/2010  . CAROTID BRUIT 01/25/2010  . PANCYTOPENIA 11/01/2009  . Fatigue 10/25/2009  . DISSECTING AORTIC ANEURYSM THORACOABDOMINAL 07/14/2009  . SCIATICA, RIGHT 05/18/2009  . HYPERLIPIDEMIA-MIXED 09/29/2008  . HYPERTENSION, BENIGN 09/29/2008  .  CAD, ARTERY BYPASS GRAFT 09/29/2008  . SYSTOLIC HEART FAILURE, CHRONIC 09/29/2008  . ICD - IN SITU 09/29/2008   - Medications: reviewed and updated   Objective:   Physical Exam BP 102/60   Pulse 98   Temp 98 F (36.7 C) (Oral)   Ht 5\' 11"  (1.803 m)   Wt 169 lb 6.4 oz (76.8 kg)   SpO2 97%   BMI 23.63 kg/m  Gen: NAD, alert, cooperative with exam, intermittently appears in pain CV: RRR, good  S1/S2, no murmur, no edema Resp: CTABL, no wheezes, non-labored Abd: Tender in the RUQ, no guarding, no distension, normal bowel sounds Musculoskeletal: Firm, smooth, well-circumscribed, fixed mass about 2-3 cm in diameter felt on palpation of left gluteal region, mildly tender to palpation Psych: Appears anxious but easily consoled     Assessment & Plan:   Colicky RUQ abdominal pain Likely cholelithiasis.  Vital signs are stable and work-up in the ED is also consistent with this diagnosis.  Unfortunately, patient is already on narcotics from the pain clinic, so Tylenol is the best option for pain control for him.  Patient asked if I could contact [facility] for him to see if he could be seen earlier, so I said that I would fax them the note from this visit.  I encouraged him to eat foods containing very little fat to try to decrease his symptoms and prescribed ursodiol 500 mg twice daily to see if this may help break down his gallstones.  Hip region mass, left Unlikely that mass is skeletal in origin due to x-ray of pelvis without visualization of mass.  Will obtain an ultrasound of the left hip and order an MRI if ultrasound shows a heterogeneous mass rather than a lipoma.  This seems to be too firm for a lipoma and is not mobile.  It is well-circumscribed and has grown slowly, it is likely a benign mass, but it does warrant further follow-up.    Maia Breslow, M.D. 11/30/2018, 9:31 PM PGY-2, [place]

## 2018-11-30 ENCOUNTER — Encounter: Payer: Self-pay | Admitting: Family Medicine

## 2018-11-30 DIAGNOSIS — R1011 Right upper quadrant pain: Secondary | ICD-10-CM | POA: Insufficient documentation

## 2018-11-30 DIAGNOSIS — R079 Chest pain, unspecified: Secondary | ICD-10-CM | POA: Diagnosis not present

## 2018-11-30 DIAGNOSIS — R0789 Other chest pain: Secondary | ICD-10-CM | POA: Diagnosis not present

## 2018-11-30 NOTE — Assessment & Plan Note (Addendum)
Likely cholelithiasis.  Vital signs are stable and work-up in the ED is also consistent with this diagnosis.  Unfortunately, patient is already on narcotics from the pain clinic, so Tylenol is the best option for pain control for him.  Patient asked if I could contact [facility] for him to see if he could be seen earlier, so I said that I would fax them the note from this visit.  I encouraged him to eat foods containing very little fat to try to decrease his symptoms and prescribed ursodiol 500 mg twice daily to see if this may help break down his gallstones.

## 2018-11-30 NOTE — Assessment & Plan Note (Signed)
Unlikely that mass is skeletal in origin due to x-ray of pelvis without visualization of mass.  Will obtain an ultrasound of the left hip and order an MRI if ultrasound shows a heterogeneous mass rather than a lipoma.  This seems to be too firm for a lipoma and is not mobile.  It is well-circumscribed and has grown slowly, it is likely a benign mass, but it does warrant further follow-up.

## 2018-12-03 ENCOUNTER — Encounter (HOSPITAL_COMMUNITY): Payer: Self-pay | Admitting: Internal Medicine

## 2018-12-03 ENCOUNTER — Ambulatory Visit (HOSPITAL_COMMUNITY)
Admission: RE | Admit: 2018-12-03 | Discharge: 2018-12-03 | Disposition: A | Discharge status: Home / Self Care | Payer: Medicaid Other | Source: Ambulatory Visit | Attending: Internal Medicine | Admitting: Internal Medicine

## 2018-12-03 VITALS — BP 126/80 | HR 71 | Wt 167.8 lb

## 2018-12-03 DIAGNOSIS — I255 Ischemic cardiomyopathy: Secondary | ICD-10-CM | POA: Insufficient documentation

## 2018-12-03 DIAGNOSIS — Z7982 Long term (current) use of aspirin: Secondary | ICD-10-CM | POA: Diagnosis not present

## 2018-12-03 DIAGNOSIS — G4730 Sleep apnea, unspecified: Secondary | ICD-10-CM | POA: Insufficient documentation

## 2018-12-03 DIAGNOSIS — Z91041 Radiographic dye allergy status: Secondary | ICD-10-CM | POA: Insufficient documentation

## 2018-12-03 DIAGNOSIS — I2510 Atherosclerotic heart disease of native coronary artery without angina pectoris: Secondary | ICD-10-CM | POA: Diagnosis not present

## 2018-12-03 DIAGNOSIS — Z91040 Latex allergy status: Secondary | ICD-10-CM | POA: Diagnosis not present

## 2018-12-03 DIAGNOSIS — Z7902 Long term (current) use of antithrombotics/antiplatelets: Secondary | ICD-10-CM | POA: Insufficient documentation

## 2018-12-03 DIAGNOSIS — I5022 Chronic systolic (congestive) heart failure: Secondary | ICD-10-CM

## 2018-12-03 DIAGNOSIS — I130 Hypertensive heart and chronic kidney disease with heart failure and stage 1 through stage 4 chronic kidney disease, or unspecified chronic kidney disease: Secondary | ICD-10-CM | POA: Insufficient documentation

## 2018-12-03 DIAGNOSIS — Z91018 Allergy to other foods: Secondary | ICD-10-CM | POA: Insufficient documentation

## 2018-12-03 DIAGNOSIS — Z79891 Long term (current) use of opiate analgesic: Secondary | ICD-10-CM | POA: Insufficient documentation

## 2018-12-03 DIAGNOSIS — I472 Ventricular tachycardia: Secondary | ICD-10-CM | POA: Diagnosis not present

## 2018-12-03 DIAGNOSIS — Z91013 Allergy to seafood: Secondary | ICD-10-CM | POA: Diagnosis not present

## 2018-12-03 DIAGNOSIS — Z882 Allergy status to sulfonamides status: Secondary | ICD-10-CM | POA: Diagnosis not present

## 2018-12-03 DIAGNOSIS — M1990 Unspecified osteoarthritis, unspecified site: Secondary | ICD-10-CM | POA: Insufficient documentation

## 2018-12-03 DIAGNOSIS — Z951 Presence of aortocoronary bypass graft: Secondary | ICD-10-CM | POA: Diagnosis not present

## 2018-12-03 DIAGNOSIS — I252 Old myocardial infarction: Secondary | ICD-10-CM | POA: Diagnosis not present

## 2018-12-03 DIAGNOSIS — J45909 Unspecified asthma, uncomplicated: Secondary | ICD-10-CM | POA: Insufficient documentation

## 2018-12-03 DIAGNOSIS — Z888 Allergy status to other drugs, medicaments and biological substances status: Secondary | ICD-10-CM | POA: Diagnosis not present

## 2018-12-03 DIAGNOSIS — Z95810 Presence of automatic (implantable) cardiac defibrillator: Secondary | ICD-10-CM | POA: Diagnosis not present

## 2018-12-03 DIAGNOSIS — N189 Chronic kidney disease, unspecified: Secondary | ICD-10-CM | POA: Diagnosis not present

## 2018-12-03 DIAGNOSIS — I7100 Dissection of unspecified site of aorta: Secondary | ICD-10-CM | POA: Insufficient documentation

## 2018-12-03 DIAGNOSIS — Z79899 Other long term (current) drug therapy: Secondary | ICD-10-CM | POA: Insufficient documentation

## 2018-12-03 NOTE — Patient Instructions (Signed)
Follow up in 4-6 weeks with Dr. Aundra Dubin

## 2018-12-03 NOTE — Progress Notes (Signed)
Advanced Heart Failure Clinic Note   Patient ID: Chad Bark., male   DOB: 1961-10-02, 57 y.o.   MRN: [id number]   Primary Cardiologist:  Dr. Glori Bickers  History of Present Illness: Chad [name] is a 57 y.o. male with history of severe HTN, coronary artery disease status post previous myocardial infarction and bypass surgery in 2006 also DES to native PDA in 2011.  He also has a history congestive heart failure secondary to ischemic cardiomyopathy EF 20-25%   He is s/p single chamber [employer] ICD.  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 Also with significant low back pain s/p 2 surgeries.   He has had episodes of CP but have avoided cath due to previous dissection and solitary kidney.  Myoview 11/2015  1. Evidence for very mild reversibility and ischemia involving the anterolateral wall and a portion of the lateral wall. 2. Large infarct involving the inferior wall. There is dyskinesia in the inferior wall. 3. Left ventricular ejection fraction is 32%. Left ventricle dilatation.  In 11/17 admitted for ICD shocks due to fractured RV lead. Underwent lead extraction and replacement.   We saw him in 2/18 for acute visit due to increasing dyspnea and volume overload. ECHO EF 20-25% moderate RV dysfunction. Moderate AI and severe MR. Ao Root 27mm-> 66mm. In 4/18, he saw Dr. 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 He was referred to [facility]plant team for further evaluation in 6/18 and felt to be too early for transplant based on  CPX.  Admitted 5/6-05/01/18 with chest pain. Troponins negative and EKG was unchanged. Chest pain thought to be related to HTN and possibly anxiety. He declined celexa. He was instructed to take extra 50 mg hydralazine for SBP >140 and 1 mg cardura for SBP >160.  Repeat CPX in 5/19 stable from previous Peak VO2: 18.7 (56% predicted peak VO2) VE/VCO2 slope: 34  Seen in ER earlier this week with CP. ECG and troponins normal so discharged home.  Returns for f/u. Feels ok. Remains relatively active but feels he is having mor problems with fluid and having to take extra lasix on top of his 80 bid. Chest feels tight when fluid is up. No recurrent CP. No orthopnea, PND or edema. Taking all meds.   ICD interrogated personally in clinic: No VT/AF. Activity level 1.4 hr/day  ReDS 44%   CPX 05/15/18 FVC 2.86 (67%)    FEV1 2.26 (68%)     FEV1/FVC 79 (100%)     MVV 106 (70%)    Resting HR: 71 Peak HR: 110  (67% age predicted max HR)  BP rest: 100/64 BP peak: 118/66  Peak VO2: 18.7 (56% predicted peak VO2) VE/VCO2 slope: 34 OUES: 1.45 Peak RER: 1.04 Ventilatory Threshold: 11.8 (35% predicted and 63% measured peak VO2) VE/MVV: 43% O2pulse: 12  (80% predicted O2pulse)  CPX 4/18   FVC 3.11 (73%)    FEV1 2.40 (71%)     FEV1/FVC 77 (97%)     MVV 117 (77%)  Resting HR: 75 Peak HR: 134  (81% age predicted max HR) BP  rest: 120/68 BP peak: 146/82 Peak VO2: 18.6 (55% predicted peak VO2) VE/VCO2 slope: 31 OUES: 2.01 Peak RER: 1.05 Ventilatory Threshold: 16.0 (47% predicted or measured peak VO2) VE/MVV: 49% O2pulse: 13  (76% predicted O2pulse)  3/18: ECHO EF 20-25% Moderate AI, moderate to severe MR, moderate TR. Ao Root 50cm 10/22/2015: ECHO EF 20-25% 11/26/12 ECHO EF 35-40%  Review of systems complete and found to be negative unless listed in HPI.   Past Medical History:  Diagnosis Date  . AICD (automatic cardioverter/defibrillator) present   . Anemia    . Anginal pain ([place])   . Aortic dissection, thoracoabdominal ([place])    7/10: Type I s/p repair  . Arthritis   . Asthma   . CAD (coronary artery disease)    a. s/p CABG 2006;  b. DES to PDA 2011 (cath: Dx not seen, dRCA/PDA tx with DES; S-PDA occluded ([place]), S-Dx occluded, S-RI and OM ok, L-LAD ok  . Carotid stenosis    dopplers 2011: 0-39% bilat.  . Chest pain syndrome   . CHF (congestive heart failure) ([place])   . Chronic bronchitis ([place])   . Chronic lower back pain   . Chronic systolic heart failure (HCC)    a. 12/13 ECHO: EF 35-40%, sept, apical & posterobasal HK, LV mod dil & sys fx mod reduced, mild AI, MV mild reg, TV mild reg  . Complication of anesthesia    "difficult to wake afterwards a couple of times"  . 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 Dyspnea   . Family history of adverse reaction to anesthesia    "sister hard to wake up"  . GERD (gastroesophageal reflux disease)   . Gout   . Heart murmur   . HLD (hyperlipidemia)   . HTN (hypertension)    severe  . Myocardial infarction [facility])    "many" (11/07/2016)  . PONV (postoperative nausea and vomiting)   . Sleep apnea    "never RX'd mask" (11/07/2016)  . Thyroid cancer ([place])    [employer]    Current Outpatient Medications  Medication Sig Dispense Refill  . aspirin EC 81 MG tablet Take 1 tablet (81 mg total) by mouth daily. Can take 4 tabs (324 mg total) as needed for chest pain. DO NOT TAKE multiple 325 mg tabs. 40 tablet 6  . B Complex-C (SUPER B COMPLEX PO) Take 3 tablets by mouth daily.     . carvedilol (COREG) 25 MG tablet TAKE 1 TABLET BY MOUTH TWICE DAILY WITH  A  MEAL (Patient taking differently: Take 25 mg by mouth 2 (two) times daily with a meal. ) 180 tablet 3  . clopidogrel (PLAVIX) 75 MG tablet Take 1 tablet (75 mg total) by mouth daily. 90 tablet 3  . dicyclomine (BENTYL) 20 MG tablet Take 1 tablet (20 mg total) by mouth 2 (two) times  daily. 20 tablet 0  . doxazosin (CARDURA) 1 MG tablet Take 1 tablet (1 mg total) by mouth every 12 (twelve) hours as needed (for systolic BP > 786 mm Hg.). 90 tablet 3  . fluticasone (FLONASE) 50 MCG/ACT nasal spray Place 2 sprays into both nostrils daily as needed for allergies or rhinitis.     . furosemide (LASIX) 80 MG tablet Take 1 tablet (80 mg total) by mouth 2 (two) times daily. 180 tablet 3  . hydrALAZINE (APRESOLINE) 50 MG tablet Take 1.5 tablets (75 mg total) by mouth 2 (two) times  daily. 270 tablet 3  . isosorbide mononitrate (IMDUR) 60 MG 24 hr tablet Take 1 tablet (60 mg total) by mouth 2 (two) times daily. 180 tablet 3  . MITIGARE 0.6 MG CAPS TAKE 2 CAPSULES BY MOUTH WHEN YOU FEEL AN ATTACK COMING ON, TAKE 1 CAPSULE THE FOLLOWING DAY 30 capsule 0  . nitroGLYCERIN (NITROSTAT) 0.4 MG SL tablet PLACE 1 TABLET UNDER THE TONGUE EVERY 5 MINUTES FOR 3 DOSES AS NEEDED FOR CHEST PAIN (Patient taking differently: Place 0.4 mg under the tongue every 5 (five) minutes as needed for chest pain. ) 25 tablet 5  . Oxycodone HCl 10 MG TABS Take 20 mg by mouth every 8 (eight) hours.     . potassium chloride (KLOR-CON M10) 10 MEQ tablet Take 1 tablet (10 mEq total) by mouth daily. 90 tablet 3  . sacubitril-valsartan (ENTRESTO) 97-103 MG Take 1 tablet by mouth 2 (two) times daily. 180 tablet 3  . spironolactone (ALDACTONE) 25 MG tablet Take 0.5 tablets (12.5 mg total) by mouth daily. 45 tablet 3  . albuterol (PROVENTIL HFA;VENTOLIN HFA) 108 (90 Base) MCG/ACT inhaler Inhale 2 puffs into the lungs every 6 (six) hours as needed for wheezing or shortness of breath. (Patient not taking: Reported on 12/03/2018) 1 Inhaler 0  . ipratropium-albuterol (DUONEB) 0.5-2.5 (3) MG/3ML SOLN Take 3 mLs by nebulization every 6 (six) hours as needed. (Patient not taking: Reported on 12/03/2018) 360 mL 3   No current facility-administered medications for this encounter.     Allergies  Allergen Reactions  . Contrast Media  [Iodinated Diagnostic Agents] Anaphylaxis  . Iohexol Anaphylaxis and Other (See Comments)    PT HAS ANAPHYLAXIS WITH CONTRAST MEDIA!  . Lipitor [Atorvastatin Calcium] Anaphylaxis and Other (See Comments)    Large doses  . Shellfish Allergy Anaphylaxis  . Sulfa Antibiotics Shortness Of Breath and Swelling  . Sulfonamide Derivatives Shortness Of Breath and Swelling  . Almond Oil Itching    FACIAL/MOUTH ITCHING  . Metrizamide Swelling    SWELLING REACTION UNSPECIFIED   . Zocor [Simvastatin] Other (See Comments)    Muscle cramps  . Latex Rash and Other (See Comments)    With long periods of exposure  . Peach [Prunus Persica] Itching    Vital Signs: Vitals:   12/03/18 1523  BP: 126/80  Pulse: 71  SpO2: 98%  Weight: 76.1 kg (167 lb 12.8 oz)   Filed Weights   12/03/18 1523  Weight: 76.1 kg (167 lb 12.8 oz)    Wt Readings from Last 3 Encounters:  12/03/18 76.1 kg (167 lb 12.8 oz)  11/29/18 76.8 kg (169 lb 6.4 oz)  11/29/18 72.6 kg (160 lb)   Physical Exam: General:  Well appearing. No resp difficulty HEENT: normal Neck: supple. JVP 8-9 Carotids 2+ bilat; no bruits. No lymphadenopathy or thryomegaly appreciated. Cor: PMI laterally displaced. Regular rate & rhythm. 2/6 AI 3/6 MR Lungs: clear Abdomen: soft, nontender, nondistended. No hepatosplenomegaly. No bruits or masses. Good bowel sounds. Extremities: no cyanosis, clubbing, rash, edema Neuro: alert & orientedx3, cranial nerves grossly intact. moves all 4 extremities w/o difficulty. Affect pleasant   ASSESSMENT/Plan  1. Chronic systolic HF: Ischemic cardiomyopathy.  Echo (2/16) with EF 25%. S/p [employer] ICD. Echo 3/18. EF 20-25%. Echo 04/18/18: EF 20-25%, grade 2 DD - RHC 04/05/18 with well compensated filling pressures with mildly to moderately reduced CO.  - Repeat CPX in 5/19 stable from previous Peak VO2: 18.7 (56% predicted peak VO2) VE/VCO2 slope: 34 - ReDS today 44% -  He has seen w Dr. Prescott Gum. Given  previous dissection and aortic root replacement likely not a candidate for VAD here. - He has been seen [facility] at [facility] for transplant eval but CPX too good at this point.  - Seems to be struggling more lately with progressive HF symptoms. Now NYHA II-III. Volume status elevated mildly (REDS likely worse due to MR) - Continue lasix 80 mg BID. Take extra as needed - Continue Entresto 97/103 bid - Continue carvedilol 25 mg twice a day.  - Continue hydralazine 75 mg bid/ Imdur 60 bid.   - Continue spironolactone 12.5 mg daily.  - I will see back in 4-6 weeks. If not improving may need repeat RHC and possible visit back to [facility] to discuss advanced therapies again.  - Repeat echo soon  2. CAD s/p CABG 2006:  - Recent ER visit for CP with negative troponin and unchanged EKG. No CP since.  - Cardiolite 11/2015 with evidence for mild reversibility involving the anterolateral wall and a portion of the lateral wall. - We have avoided left heart cath due to dissection and CKD with solitary kidney (lost kidney due to dissection) - Continue Plavix and ASA 81 mg daily.   - Statin intolerance at any dose. Also unable to tolerate Zetia - Last lipids 12/18: TC 150 HDL 43 LDL 95. Will repeat and if LDL > 70 refer to lipid clinic for possible PCSK-9  3. CKD in setting of solitary kidney s/p dissection - Recent BMET with creatinine 1.68 -> 1.9. Repeat today  4. HTN:  - Blood pressure well controlled. Continue current regimen. - Stressed importance of not over-treating in setting of solitary kidney.  5. H/o Type I aortic dissection: Follows with CVTS.  - He has seen w Dr. Prescott Gum. Given previous dissection and aortic root replacement likely not candidate for VAD. - Echo 04/18/18: aortic root, ascending aorta, and aortic arch all mildly dilated  6. NSVT - ICD interrogated personally today. No VT/AF.     Glori Bickers, MD 3:38 PM

## 2018-12-05 ENCOUNTER — Ambulatory Visit: Payer: Self-pay | Admitting: General Surgery

## 2018-12-05 ENCOUNTER — Encounter (HOSPITAL_COMMUNITY): Payer: Self-pay | Admitting: *Deleted

## 2018-12-05 ENCOUNTER — Ambulatory Visit
Admission: RE | Admit: 2018-12-05 | Discharge: 2018-12-05 | Disposition: A | Discharge status: Home / Self Care | Payer: Medicaid Other | Source: Ambulatory Visit | Attending: Family Medicine | Admitting: Family Medicine

## 2018-12-05 DIAGNOSIS — R2242 Localized swelling, mass and lump, left lower limb: Secondary | ICD-10-CM

## 2018-12-05 DIAGNOSIS — K8020 Calculus of gallbladder without cholecystitis without obstruction: Secondary | ICD-10-CM | POA: Diagnosis not present

## 2018-12-05 NOTE — Progress Notes (Signed)
Anesthesia ( Dr Lanetta Inch ) made aware of patient history and LOV with Dr Haroldine Laws on 12/03/2018.  Anesthesia  Would like a note from cardiology stating that patient is optimized for surgery for 12/09/2018.  Called office of [facility] and made Elmo Putt at Triage desk aware that I was trying to get in touch with cardiology regarding above.  After calling another phone number for the HF Clinic nurse left a message on the Triage line regarding that Anesthesia wants a note from Cardiology stating that this patient is optimized for surgery on 12/09/2018.  Left fax number and call back phone number.  Called [facility] and spoke with Abigail Butts in Triage and made them aware of above and told them I would call them back once nurse had received phone call or fax from cardiology.

## 2018-12-05 NOTE — H&P (Signed)
  History of Present Illness Ralene Ok MD; 12/05/2018 11:13 AM) The patient is a 57 year old male who presents for evaluation of gall stones. Chief Complaint: Gallstones  Patient is a 57 year old male who recently underwent lap Scott hernia surgery. Patient comes in with several week history of radical quadrant pain that radiates to the back. Patient's been in the ER secondary to his symptoms multiple occasions with the last 2-3 weeks. Patient would ultrasound revealed cholelithiasis without signs of cholecystitis. I did review his ultrasound personally. Patient states that his pain usually is after eating some high in fat. Patient's laboratory studies reveal no cyanosis with choledocholithiasis.    Allergies Emeline Gins, CMA; 12/05/2018 10:56 AM) Iohexol *DIAGNOSTIC PRODUCTS*  Lipitor *ANTIHYPERLIPIDEMICS*  Shellfish-derived Products  Sulfonamide Derivatives  Latex Exam Gloves *MEDICAL DEVICES AND SUPPLIES*  Zocor *ANTIHYPERLIPIDEMICS*  Allergies Reconciled   Medication History Emeline Gins, CMA; 12/05/2018 10:57 AM) Dicyclomine HCl (20MG  Tablet, Oral) Active. Aspirin (81MG  Tablet Chewable, Oral) Active. Entresto (97-103MG  Tablet, Oral) Active. Flexeril (5MG  Tablet, Oral as needed) Active. HydrALAZINE HCl (50MG  Tablet, Oral) Active. Isosorbide Mononitrate (60MG  Tablet ER, Oral) Active. Spironolactone (25MG  Tablet, Oral) Active. B Complex (Oral) Active. Coreg (25MG  Tablet, Oral) Active. Plavix (75MG  Tablet, Oral) Active. Lasix (40MG  Tablet, Oral) Active. Multiple Vitamin (Oral) Active. Nitrostat (0.4MG  Tab Sublingual, Sublingual) Active. K-Dur [facility] Tablet ER, Oral) Active. OxyCODONE HCl ER (10MG  Tablet ER 12HR, Oral) Active. Medications Reconciled    Review of Systems Ralene Ok, MD; 12/05/2018 11:14 AM) General Present- Feeling well. Not Present- Fever. Respiratory Not Present- Cough and Difficulty Breathing. Cardiovascular  Not Present- Chest Pain. Gastrointestinal Present- Abdominal Pain and Nausea. Musculoskeletal Not Present- Myalgia. Neurological Not Present- Weakness. All other systems negative  Vitals Emeline Gins CMA; 12/05/2018 10:56 AM) 12/05/2018 10:56 AM Weight: 168.13 lb Height: 71in Body Surface Area: 1.96 m Body Mass Index: 23.45 kg/m  Temp.: 98.36F  Pulse: 69 (Regular)  BP: 120/70 (Sitting, Left Arm, Standard)       Assessment & Plan Ralene Ok MD; 12/05/2018 11:14 AM) GALL STONES (K80.20) Impression: 57 year old male with symptomatic cholelithiasis.  1. The patient will like to proceed to the operative for laparoscopic cholecystectomy. 2. Patient was recently cleared for surgery recently. He is currently on Plavix. We'll have him stop that 5 days prior to surgery. 3. Risks and benefits were discussed with the patient to generally include, but not limited to: infection, bleeding, possible need for post op ERCP, damage to the bile ducts, bile leak, and possible need for further surgery. Alternatives were offered and described. All questions were answered and the patient voiced understanding of the procedure and wishes to proceed at this point with a laparoscopic cholecystectomy

## 2018-12-05 NOTE — Progress Notes (Signed)
Received phone call from Levada Dy at the office of Dr Haroldine Laws ( cardiology)  And stated that Dr Haroldine Laws stated he does not feel comfortable with patient having surgery on 12/09/2018 and wanted surgeon ( Dr Rosendo Gros) to give him a call at [phone]. Called office of [facility] and spoke with Abigail Butts in Triage at [place] and informed her that Dr Haroldine Laws wants Dr Rosendo Gros to call him at the above listed number.  Abigail Butts in Triage stated that she would inform Dr Rosendo Gros. Dr Lanetta Inch made aware of above.

## 2018-12-05 NOTE — H&P (View-Only) (Signed)
  History of Present Illness Ralene Ok MD; 12/05/2018 11:13 AM) The patient is a 57 year old male who presents for evaluation of gall stones. Chief Complaint: Gallstones  Patient is a 57 year old male who recently underwent lap Scott hernia surgery. Patient comes in with several week history of radical quadrant pain that radiates to the back. Patient's been in the ER secondary to his symptoms multiple occasions with the last 2-3 weeks. Patient would ultrasound revealed cholelithiasis without signs of cholecystitis. I did review his ultrasound personally. Patient states that his pain usually is after eating some high in fat. Patient's laboratory studies reveal no cyanosis with choledocholithiasis.    Allergies Emeline Gins, CMA; 12/05/2018 10:56 AM) Iohexol *DIAGNOSTIC PRODUCTS*  Lipitor *ANTIHYPERLIPIDEMICS*  Shellfish-derived Products  Sulfonamide Derivatives  Latex Exam Gloves *MEDICAL DEVICES AND SUPPLIES*  Zocor *ANTIHYPERLIPIDEMICS*  Allergies Reconciled   Medication History Emeline Gins, CMA; 12/05/2018 10:57 AM) Dicyclomine HCl (20MG  Tablet, Oral) Active. Aspirin (81MG  Tablet Chewable, Oral) Active. Entresto (97-103MG  Tablet, Oral) Active. Flexeril (5MG  Tablet, Oral as needed) Active. HydrALAZINE HCl (50MG  Tablet, Oral) Active. Isosorbide Mononitrate (60MG  Tablet ER, Oral) Active. Spironolactone (25MG  Tablet, Oral) Active. B Complex (Oral) Active. Coreg (25MG  Tablet, Oral) Active. Plavix (75MG  Tablet, Oral) Active. Lasix (40MG  Tablet, Oral) Active. Multiple Vitamin (Oral) Active. Nitrostat (0.4MG  Tab Sublingual, Sublingual) Active. K-Dur [facility] Tablet ER, Oral) Active. OxyCODONE HCl ER (10MG  Tablet ER 12HR, Oral) Active. Medications Reconciled    Review of Systems Ralene Ok, MD; 12/05/2018 11:14 AM) General Present- Feeling well. Not Present- Fever. Respiratory Not Present- Cough and Difficulty Breathing. Cardiovascular  Not Present- Chest Pain. Gastrointestinal Present- Abdominal Pain and Nausea. Musculoskeletal Not Present- Myalgia. Neurological Not Present- Weakness. All other systems negative  Vitals Emeline Gins CMA; 12/05/2018 10:56 AM) 12/05/2018 10:56 AM Weight: 168.13 lb Height: 71in Body Surface Area: 1.96 m Body Mass Index: 23.45 kg/m  Temp.: 98.31F  Pulse: 69 (Regular)  BP: 120/70 (Sitting, Left Arm, Standard)       Assessment & Plan Ralene Ok MD; 12/05/2018 11:14 AM) GALL STONES (K80.20) Impression: 57 year old male with symptomatic cholelithiasis.  1. The patient will like to proceed to the operative for laparoscopic cholecystectomy. 2. Patient was recently cleared for surgery recently. He is currently on Plavix. We'll have him stop that 5 days prior to surgery. 3. Risks and benefits were discussed with the patient to generally include, but not limited to: infection, bleeding, possible need for post op ERCP, damage to the bile ducts, bile leak, and possible need for further surgery. Alternatives were offered and described. All questions were answered and the patient voiced understanding of the procedure and wishes to proceed at this point with a laparoscopic cholecystectomy

## 2018-12-05 NOTE — Patient Instructions (Signed)
Chad [name].  12/05/2018   Your procedure is scheduled on: 12/09/2018   Report to [facility] Main  Entrance  Report to admitting at    0900 AM    Call this number if you have problems the morning of surgery [phone]   Remember: Do not eat food or drink liquids :After Midnight. BRUSH YOUR TEETH MORNING OF SURGERY AND RINSE YOUR MOUTH OUT, NO CHEWING GUM CANDY OR MINTS.     Take these medicines the morning of surgery with A SIP OF WATER: Inhalers as usual and bring, Carvedilol, Cardura I fneeded, Entresto, Flonase if needed, Hydralazine, Imdur, Oxycodone if needed                                 You may not have any metal on your body including hair pins and              piercings  Do not wear jewelry, , lotions, powders or perfumes, deodorant                        Men may shave face and neck.   Do not bring valuables to the hospital. [place].  Contacts, dentures or bridgework may not be worn into surgery.  Leave suitcase in the car. After surgery it may be brought to your room.     Patients discharged the day of surgery will not be allowed to drive home.  Name and phone number of your driver:                 Please read over the following fact sheets you were given: _____________________________________________________________________             [facility] - Preparing for Surgery Before surgery, you can play an important role.  Because skin is not sterile, your skin needs to be as free of germs as possible.  You can reduce the number of germs on your skin by washing with CHG (chlorahexidine gluconate) soap before surgery.  CHG is an antiseptic cleaner which kills germs and bonds with the skin to continue killing germs even after washing. Please DO NOT use if you have an allergy to CHG or antibacterial soaps.  If your skin becomes reddened/irritated stop using the CHG and inform your nurse  when you arrive at Short Stay. Do not shave (including legs and underarms) for at least 48 hours prior to the first CHG shower.  You may shave your face/neck. Please follow these instructions carefully:  1.  Shower with CHG Soap the night before surgery and the  morning of Surgery.  2.  If you choose to wash your hair, wash your hair first as usual with your  normal  shampoo.  3.  After you shampoo, rinse your hair and body thoroughly to remove the  shampoo.                           4.  Use CHG as you would any other liquid soap.  You can apply chg directly  to the skin and wash  Gently with a scrungie or clean washcloth.  5.  Apply the CHG Soap to your body ONLY FROM THE NECK DOWN.   Do not use on face/ open                           Wound or open sores. Avoid contact with eyes, ears mouth and genitals (private parts).                       Wash face,  Genitals (private parts) with your normal soap.             6.  Wash thoroughly, paying special attention to the area where your surgery  will be performed.  7.  Thoroughly rinse your body with warm water from the neck down.  8.  DO NOT shower/wash with your normal soap after using and rinsing off  the CHG Soap.                9.  Pat yourself dry with a clean towel.            10.  Wear clean pajamas.            11.  Place clean sheets on your bed the night of your first shower and do not  sleep with pets. Day of Surgery : Do not apply any lotions/deodorants the morning of surgery.  Please wear clean clothes to the hospital/surgery center.  FAILURE TO FOLLOW THESE INSTRUCTIONS MAY RESULT IN THE CANCELLATION OF YOUR SURGERY PATIENT SIGNATURE_________________________________  NURSE SIGNATURE__________________________________  ________________________________________________________________________

## 2018-12-05 NOTE — Progress Notes (Signed)
Jasmyn called from the office of Dr Haroldine Laws and stated that Dr Haroldine Laws wanted Dr Rosendo Gros to call him in reference to patient's surgery on 12/09/2018 and that patient is not optimized for surgery on 12/09/2018.  Informed Jasmyn that nurse had already spoken with [facility] and informed Abigail Butts in Triage at [place] of Dr Bensimhon's request that Dr Rosendo Gros call him and phone number given and that Abigail Butts stated she would have DR Rosendo Gros contact Dr Haroldine Laws.  Provided Jasmyn with phone number for [facility] and that I had spoked with Abigail Butts in Triage at office.

## 2018-12-06 ENCOUNTER — Other Ambulatory Visit: Payer: Self-pay

## 2018-12-06 ENCOUNTER — Telehealth: Payer: Self-pay | Admitting: Family Medicine

## 2018-12-06 ENCOUNTER — Encounter (HOSPITAL_COMMUNITY)
Admission: RE | Admit: 2018-12-06 | Discharge: 2018-12-06 | Disposition: A | Discharge status: Home / Self Care | Payer: Medicaid Other | Source: Ambulatory Visit | Attending: General Surgery | Admitting: General Surgery

## 2018-12-06 ENCOUNTER — Encounter (HOSPITAL_COMMUNITY): Payer: Self-pay

## 2018-12-06 ENCOUNTER — Other Ambulatory Visit: Payer: Self-pay | Admitting: Family Medicine

## 2018-12-06 DIAGNOSIS — K8020 Calculus of gallbladder without cholecystitis without obstruction: Secondary | ICD-10-CM | POA: Diagnosis not present

## 2018-12-06 DIAGNOSIS — Z01812 Encounter for preprocedural laboratory examination: Secondary | ICD-10-CM | POA: Insufficient documentation

## 2018-12-06 DIAGNOSIS — R1907 Generalized intra-abdominal and pelvic swelling, mass and lump: Secondary | ICD-10-CM

## 2018-12-06 HISTORY — DX: Anxiety disorder, unspecified: F41.9

## 2018-12-06 LAB — CBC
HEMATOCRIT: 39.4 % (ref 39.0–52.0)
HEMOGLOBIN: 12.4 g/dL — AB (ref 13.0–17.0)
MCH: 27.1 pg (ref 26.0–34.0)
MCHC: 31.5 g/dL (ref 30.0–36.0)
MCV: 86 fL (ref 80.0–100.0)
Platelets: 145 10*3/uL — ABNORMAL LOW (ref 150–400)
RBC: 4.58 MIL/uL (ref 4.22–5.81)
RDW: 14.6 % (ref 11.5–15.5)
WBC: 3.4 10*3/uL — ABNORMAL LOW (ref 4.0–10.5)
nRBC: 0 % (ref 0.0–0.2)

## 2018-12-06 LAB — COMPREHENSIVE METABOLIC PANEL
ALBUMIN: 4.3 g/dL (ref 3.5–5.0)
ALT: 20 U/L (ref 0–44)
AST: 16 U/L (ref 15–41)
Alkaline Phosphatase: 93 U/L (ref 38–126)
Anion gap: 9 (ref 5–15)
BUN: 25 mg/dL — ABNORMAL HIGH (ref 6–20)
CO2: 27 mmol/L (ref 22–32)
Calcium: 8.7 mg/dL — ABNORMAL LOW (ref 8.9–10.3)
Chloride: 103 mmol/L (ref 98–111)
Creatinine, Ser: 1.53 mg/dL — ABNORMAL HIGH (ref 0.61–1.24)
GFR calc Af Amer: 58 mL/min — ABNORMAL LOW (ref >60)
GFR calc non Af Amer: 50 mL/min — ABNORMAL LOW (ref >60)
GLUCOSE: 116 mg/dL — AB (ref 70–99)
Potassium: 3.6 mmol/L (ref 3.5–5.1)
Sodium: 139 mmol/L (ref 135–145)
Total Bilirubin: 1.1 mg/dL (ref 0.3–1.2)
Total Protein: 7.1 g/dL (ref 6.5–8.1)

## 2018-12-06 NOTE — Telephone Encounter (Signed)
Called patient to inform him of his ultrasound results, which show a complex mass.  His results indicate that an MRI is necessary.  Since he is having his gallbladder out on Monday, December 16, I will ask one of my CMAs to call him later that week to schedule his MRI at the hospital.  Patient was agreeable to this plan.

## 2018-12-06 NOTE — Progress Notes (Addendum)
D/w Dr. Haroldine Laws the patient's cardiac status and his need for upcoming surgery.  He states he would place him at moderate to high risk.  Patient has had multiple ER visits secondary to right upper quadrant pain in the last 3-4 weeks.  I long discussion with the patient that secondary to his moderate to high risk there is a increased chance for cardiac complications, possible death.  Patient states at this time he would like to proceed with surgery and understands his risk.  We will proceed with surgery plan on observation.

## 2018-12-06 NOTE — Progress Notes (Signed)
11/27/18-ekg-epic 11/27/18-cxr-epic LOV-12/03/2018- in epic and on chart with DR Bensimhon  09/16/2018- last device check in epic  05/15/18-stress Test  04/05/2018- Cath- epic  04/18/2018- ECHO-epic  Note - DR Rosendo Gros regarding cardiac clearance dated 12/06/18-epic

## 2018-12-06 NOTE — Progress Notes (Signed)
CMP done 12/06/18 faxed via epic to Dr Rosendo Gros.

## 2018-12-09 ENCOUNTER — Other Ambulatory Visit: Payer: Self-pay

## 2018-12-09 ENCOUNTER — Observation Stay (HOSPITAL_COMMUNITY)
Admission: RE | Admit: 2018-12-09 | Discharge: 2018-12-10 | Disposition: A | Discharge status: Home / Self Care | Payer: Medicaid Other | Attending: General Surgery | Admitting: General Surgery

## 2018-12-09 ENCOUNTER — Ambulatory Visit (HOSPITAL_COMMUNITY): Payer: Medicaid Other | Admitting: Certified Registered Nurse Anesthetist

## 2018-12-09 ENCOUNTER — Encounter (HOSPITAL_COMMUNITY): Payer: Self-pay

## 2018-12-09 ENCOUNTER — Encounter (HOSPITAL_COMMUNITY): Admission: RE | Disposition: A | Discharge status: Home / Self Care | Payer: Self-pay | Source: Home / Self Care

## 2018-12-09 DIAGNOSIS — K219 Gastro-esophageal reflux disease without esophagitis: Secondary | ICD-10-CM | POA: Diagnosis not present

## 2018-12-09 DIAGNOSIS — Z7902 Long term (current) use of antithrombotics/antiplatelets: Secondary | ICD-10-CM | POA: Diagnosis not present

## 2018-12-09 DIAGNOSIS — M1990 Unspecified osteoarthritis, unspecified site: Secondary | ICD-10-CM | POA: Insufficient documentation

## 2018-12-09 DIAGNOSIS — I252 Old myocardial infarction: Secondary | ICD-10-CM | POA: Insufficient documentation

## 2018-12-09 DIAGNOSIS — I2510 Atherosclerotic heart disease of native coronary artery without angina pectoris: Secondary | ICD-10-CM | POA: Diagnosis not present

## 2018-12-09 DIAGNOSIS — Z9049 Acquired absence of other specified parts of digestive tract: Secondary | ICD-10-CM

## 2018-12-09 DIAGNOSIS — I130 Hypertensive heart and chronic kidney disease with heart failure and stage 1 through stage 4 chronic kidney disease, or unspecified chronic kidney disease: Secondary | ICD-10-CM | POA: Diagnosis not present

## 2018-12-09 DIAGNOSIS — K8020 Calculus of gallbladder without cholecystitis without obstruction: Secondary | ICD-10-CM | POA: Diagnosis not present

## 2018-12-09 DIAGNOSIS — N183 Chronic kidney disease, stage 3 (moderate): Secondary | ICD-10-CM | POA: Diagnosis not present

## 2018-12-09 DIAGNOSIS — I110 Hypertensive heart disease with heart failure: Secondary | ICD-10-CM | POA: Insufficient documentation

## 2018-12-09 DIAGNOSIS — I5042 Chronic combined systolic (congestive) and diastolic (congestive) heart failure: Secondary | ICD-10-CM | POA: Diagnosis not present

## 2018-12-09 DIAGNOSIS — K8010 Calculus of gallbladder with chronic cholecystitis without obstruction: Principal | ICD-10-CM | POA: Insufficient documentation

## 2018-12-09 DIAGNOSIS — Z79899 Other long term (current) drug therapy: Secondary | ICD-10-CM | POA: Diagnosis not present

## 2018-12-09 DIAGNOSIS — G4730 Sleep apnea, unspecified: Secondary | ICD-10-CM | POA: Diagnosis not present

## 2018-12-09 DIAGNOSIS — I509 Heart failure, unspecified: Secondary | ICD-10-CM | POA: Insufficient documentation

## 2018-12-09 DIAGNOSIS — Z9089 Acquired absence of other organs: Secondary | ICD-10-CM

## 2018-12-09 DIAGNOSIS — Z7982 Long term (current) use of aspirin: Secondary | ICD-10-CM | POA: Insufficient documentation

## 2018-12-09 HISTORY — PX: CHOLECYSTECTOMY: SHX55

## 2018-12-09 LAB — TROPONIN I: Troponin I: <0.03 ng/mL (ref <0.03)

## 2018-12-09 SURGERY — LAPAROSCOPIC CHOLECYSTECTOMY
Anesthesia: General | Site: Abdomen

## 2018-12-09 MED ORDER — PHENYLEPHRINE HCL 10 MG/ML IJ SOLN
INTRAMUSCULAR | Status: AC
  Filled 2018-12-09: qty 1

## 2018-12-09 MED ORDER — FENTANYL CITRATE (PF) 250 MCG/5ML IJ SOLN
INTRAMUSCULAR | Status: AC
  Filled 2018-12-09: qty 5

## 2018-12-09 MED ORDER — ETOMIDATE 2 MG/ML IV SOLN
INTRAVENOUS | Status: DC | PRN
  Administered 2018-12-09: 20 mg via INTRAVENOUS

## 2018-12-09 MED ORDER — PROMETHAZINE HCL 25 MG/ML IJ SOLN
6.2500 mg | INTRAMUSCULAR | Status: DC | PRN
  Administered 2018-12-09: 6.25 mg via INTRAVENOUS

## 2018-12-09 MED ORDER — POTASSIUM CHLORIDE CRYS ER 10 MEQ PO TBCR
10.0000 meq | EXTENDED_RELEASE_TABLET | Freq: Every day | ORAL | Status: DC
  Administered 2018-12-09 – 2018-12-10 (×2): 10 meq via ORAL
  Filled 2018-12-09 (×2): qty 1

## 2018-12-09 MED ORDER — TRAMADOL HCL 50 MG PO TABS
50.0000 mg | ORAL_TABLET | Freq: Four times a day (QID) | ORAL | Status: DC | PRN
  Administered 2018-12-10 (×2): 50 mg via ORAL
  Filled 2018-12-09 (×2): qty 1

## 2018-12-09 MED ORDER — FENTANYL CITRATE (PF) 100 MCG/2ML IJ SOLN: 25.0000 ug | INTRAMUSCULAR | Status: DC | PRN

## 2018-12-09 MED ORDER — CHLORHEXIDINE GLUCONATE CLOTH 2 % EX PADS: 6.0000 | MEDICATED_PAD | Freq: Once | CUTANEOUS | Status: DC

## 2018-12-09 MED ORDER — LACTATED RINGERS IR SOLN
Status: DC | PRN
  Administered 2018-12-09: 1000 mL

## 2018-12-09 MED ORDER — HYDRALAZINE HCL 50 MG PO TABS
75.0000 mg | ORAL_TABLET | Freq: Two times a day (BID) | ORAL | Status: DC
  Administered 2018-12-09: 75 mg via ORAL
  Filled 2018-12-09 (×2): qty 1

## 2018-12-09 MED ORDER — GABAPENTIN 300 MG PO CAPS
300.0000 mg | ORAL_CAPSULE | ORAL | Status: AC
  Administered 2018-12-09: 300 mg via ORAL

## 2018-12-09 MED ORDER — LACTATED RINGERS IV SOLN
INTRAVENOUS | Status: DC
  Administered 2018-12-09: 10:00:00 via INTRAVENOUS

## 2018-12-09 MED ORDER — ONDANSETRON HCL 4 MG/2ML IJ SOLN
INTRAMUSCULAR | Status: AC
  Filled 2018-12-09: qty 2

## 2018-12-09 MED ORDER — GLUCAGON HCL RDNA (DIAGNOSTIC) 1 MG IJ SOLR
INTRAMUSCULAR | Status: AC
  Filled 2018-12-09: qty 1

## 2018-12-09 MED ORDER — PHENOL 1.4 % MT LIQD
1.0000 | OROMUCOSAL | Status: DC | PRN
  Filled 2018-12-09: qty 177

## 2018-12-09 MED ORDER — ONDANSETRON 4 MG PO TBDP: 4.0000 mg | ORAL_TABLET | Freq: Four times a day (QID) | ORAL | Status: DC | PRN

## 2018-12-09 MED ORDER — LIDOCAINE 2% (20 MG/ML) 5 ML SYRINGE
INTRAMUSCULAR | Status: AC
  Filled 2018-12-09: qty 5

## 2018-12-09 MED ORDER — MIDAZOLAM HCL 2 MG/2ML IJ SOLN
INTRAMUSCULAR | Status: AC
  Filled 2018-12-09: qty 2

## 2018-12-09 MED ORDER — SPIRONOLACTONE 12.5 MG HALF TABLET
12.5000 mg | ORAL_TABLET | Freq: Every day | ORAL | Status: DC
  Administered 2018-12-09 – 2018-12-10 (×2): 12.5 mg via ORAL
  Filled 2018-12-09 (×2): qty 1

## 2018-12-09 MED ORDER — OXYCODONE HCL 5 MG PO TABS
20.0000 mg | ORAL_TABLET | Freq: Three times a day (TID) | ORAL | Status: DC
  Administered 2018-12-09 – 2018-12-10 (×3): 20 mg via ORAL
  Filled 2018-12-09 (×3): qty 4

## 2018-12-09 MED ORDER — CARVEDILOL 25 MG PO TABS
25.0000 mg | ORAL_TABLET | Freq: Two times a day (BID) | ORAL | Status: DC
  Administered 2018-12-09: 25 mg via ORAL
  Filled 2018-12-09 (×2): qty 1

## 2018-12-09 MED ORDER — DEXAMETHASONE SODIUM PHOSPHATE 10 MG/ML IJ SOLN
INTRAMUSCULAR | Status: DC | PRN
  Administered 2018-12-09: 10 mg via INTRAVENOUS

## 2018-12-09 MED ORDER — 0.9 % SODIUM CHLORIDE (POUR BTL) OPTIME
TOPICAL | Status: DC | PRN
  Administered 2018-12-09: 1000 mL

## 2018-12-09 MED ORDER — ONDANSETRON HCL 4 MG/2ML IJ SOLN
INTRAMUSCULAR | Status: DC | PRN
  Administered 2018-12-09: 4 mg via INTRAVENOUS

## 2018-12-09 MED ORDER — BUPIVACAINE-EPINEPHRINE 0.25% -1:200000 IJ SOLN
INTRAMUSCULAR | Status: DC | PRN
  Administered 2018-12-09: 10 mL

## 2018-12-09 MED ORDER — SUGAMMADEX SODIUM 200 MG/2ML IV SOLN
INTRAVENOUS | Status: AC
  Filled 2018-12-09: qty 2

## 2018-12-09 MED ORDER — PROPOFOL 10 MG/ML IV BOLUS
INTRAVENOUS | Status: DC | PRN
  Administered 2018-12-09: 30 mg via INTRAVENOUS

## 2018-12-09 MED ORDER — GABAPENTIN 300 MG PO CAPS
ORAL_CAPSULE | ORAL | Status: AC
  Filled 2018-12-09: qty 1

## 2018-12-09 MED ORDER — NITROGLYCERIN 0.4 MG SL SUBL
0.4000 mg | SUBLINGUAL_TABLET | SUBLINGUAL | Status: DC | PRN
  Administered 2018-12-09 (×3): 0.4 mg via SUBLINGUAL

## 2018-12-09 MED ORDER — CELECOXIB 200 MG PO CAPS: 200.0000 mg | ORAL_CAPSULE | ORAL | Status: DC

## 2018-12-09 MED ORDER — DOXAZOSIN MESYLATE 1 MG PO TABS
1.0000 mg | ORAL_TABLET | Freq: Two times a day (BID) | ORAL | Status: DC | PRN
  Filled 2018-12-09: qty 1

## 2018-12-09 MED ORDER — FUROSEMIDE 40 MG PO TABS
80.0000 mg | ORAL_TABLET | Freq: Two times a day (BID) | ORAL | Status: DC
  Administered 2018-12-09 – 2018-12-10 (×2): 80 mg via ORAL
  Filled 2018-12-09 (×2): qty 2

## 2018-12-09 MED ORDER — DIATRIZOATE MEGLUMINE 30 % UR SOLN
URETHRAL | Status: AC
  Filled 2018-12-09: qty 100

## 2018-12-09 MED ORDER — CEFAZOLIN SODIUM-DEXTROSE 2-4 GM/100ML-% IV SOLN
INTRAVENOUS | Status: AC
  Filled 2018-12-09: qty 100

## 2018-12-09 MED ORDER — ACETAMINOPHEN 500 MG PO TABS
ORAL_TABLET | ORAL | Status: AC
  Filled 2018-12-09: qty 2

## 2018-12-09 MED ORDER — MIDAZOLAM HCL 5 MG/5ML IJ SOLN
INTRAMUSCULAR | Status: DC | PRN
  Administered 2018-12-09: 2 mg via INTRAVENOUS

## 2018-12-09 MED ORDER — HYDROMORPHONE HCL 1 MG/ML IJ SOLN
INTRAMUSCULAR | Status: AC
  Filled 2018-12-09: qty 1

## 2018-12-09 MED ORDER — ACETAMINOPHEN 500 MG PO TABS
1000.0000 mg | ORAL_TABLET | ORAL | Status: AC
  Administered 2018-12-09: 1000 mg via ORAL

## 2018-12-09 MED ORDER — SUGAMMADEX SODIUM 200 MG/2ML IV SOLN
INTRAVENOUS | Status: DC | PRN
  Administered 2018-12-09: 200 mg via INTRAVENOUS

## 2018-12-09 MED ORDER — OXYCODONE HCL 5 MG/5ML PO SOLN
5.0000 mg | Freq: Once | ORAL | Status: DC | PRN
  Filled 2018-12-09: qty 5

## 2018-12-09 MED ORDER — SODIUM CHLORIDE 0.9 % IV SOLN
INTRAVENOUS | Status: DC | PRN
  Administered 2018-12-09: 40 ug/min via INTRAVENOUS

## 2018-12-09 MED ORDER — ALBUTEROL SULFATE (2.5 MG/3ML) 0.083% IN NEBU: 3.0000 mL | INHALATION_SOLUTION | Freq: Four times a day (QID) | RESPIRATORY_TRACT | Status: DC | PRN

## 2018-12-09 MED ORDER — ONDANSETRON HCL 4 MG/2ML IJ SOLN: 4.0000 mg | Freq: Four times a day (QID) | INTRAMUSCULAR | Status: DC | PRN

## 2018-12-09 MED ORDER — HYDROMORPHONE HCL 1 MG/ML IJ SOLN
1.0000 mg | INTRAMUSCULAR | Status: DC | PRN
  Administered 2018-12-10: 1 mg via INTRAVENOUS
  Filled 2018-12-09: qty 1

## 2018-12-09 MED ORDER — OXYCODONE HCL 5 MG PO TABS: 5.0000 mg | ORAL_TABLET | Freq: Once | ORAL | Status: DC | PRN

## 2018-12-09 MED ORDER — PROPOFOL 10 MG/ML IV BOLUS
INTRAVENOUS | Status: AC
  Filled 2018-12-09: qty 20

## 2018-12-09 MED ORDER — ROCURONIUM BROMIDE 50 MG/5ML IV SOSY
PREFILLED_SYRINGE | INTRAVENOUS | Status: DC | PRN
  Administered 2018-12-09: 50 mg via INTRAVENOUS

## 2018-12-09 MED ORDER — ROCURONIUM BROMIDE 10 MG/ML (PF) SYRINGE
PREFILLED_SYRINGE | INTRAVENOUS | Status: AC
  Filled 2018-12-09: qty 10

## 2018-12-09 MED ORDER — PROMETHAZINE HCL 25 MG/ML IJ SOLN
INTRAMUSCULAR | Status: AC
  Filled 2018-12-09: qty 1

## 2018-12-09 MED ORDER — BUPIVACAINE-EPINEPHRINE (PF) 0.25% -1:200000 IJ SOLN
INTRAMUSCULAR | Status: AC
  Filled 2018-12-09: qty 60

## 2018-12-09 MED ORDER — LIDOCAINE 2% (20 MG/ML) 5 ML SYRINGE
INTRAMUSCULAR | Status: DC | PRN
  Administered 2018-12-09: 80 mg via INTRAVENOUS

## 2018-12-09 MED ORDER — CELECOXIB 200 MG PO CAPS
ORAL_CAPSULE | ORAL | Status: AC
  Filled 2018-12-09: qty 1

## 2018-12-09 MED ORDER — IOPAMIDOL (ISOVUE-300) INJECTION 61%
INTRAVENOUS | Status: AC
  Filled 2018-12-09: qty 50

## 2018-12-09 MED ORDER — CEFAZOLIN SODIUM-DEXTROSE 2-4 GM/100ML-% IV SOLN
2.0000 g | INTRAVENOUS | Status: AC
  Administered 2018-12-09: 2 g via INTRAVENOUS

## 2018-12-09 MED ORDER — ISOSORBIDE MONONITRATE ER 60 MG PO TB24
60.0000 mg | ORAL_TABLET | Freq: Two times a day (BID) | ORAL | Status: DC
  Administered 2018-12-09: 60 mg via ORAL
  Filled 2018-12-09 (×2): qty 1

## 2018-12-09 MED ORDER — HYDROMORPHONE HCL 1 MG/ML IJ SOLN
INTRAMUSCULAR | Status: AC
  Filled 2018-12-09: qty 2

## 2018-12-09 MED ORDER — DEXTROSE-NACL 5-0.9 % IV SOLN
INTRAVENOUS | Status: DC
  Administered 2018-12-09 – 2018-12-10 (×2): via INTRAVENOUS

## 2018-12-09 MED ORDER — HYDROMORPHONE HCL 1 MG/ML IJ SOLN
0.2500 mg | INTRAMUSCULAR | Status: DC | PRN
  Administered 2018-12-09: 0.5 mg via INTRAVENOUS
  Administered 2018-12-09 (×2): 0.25 mg via INTRAVENOUS
  Administered 2018-12-09 (×3): 0.5 mg via INTRAVENOUS

## 2018-12-09 MED ORDER — FENTANYL CITRATE (PF) 100 MCG/2ML IJ SOLN
INTRAMUSCULAR | Status: DC | PRN
  Administered 2018-12-09 (×2): 50 ug via INTRAVENOUS

## 2018-12-09 MED ORDER — NITROGLYCERIN 0.4 MG SL SUBL
SUBLINGUAL_TABLET | SUBLINGUAL | Status: AC
  Administered 2018-12-09: 0.4 mg
  Filled 2018-12-09: qty 1

## 2018-12-09 MED ORDER — DEXAMETHASONE SODIUM PHOSPHATE 10 MG/ML IJ SOLN
INTRAMUSCULAR | Status: AC
  Filled 2018-12-09: qty 1

## 2018-12-09 MED ORDER — FLUTICASONE PROPIONATE 50 MCG/ACT NA SUSP
2.0000 | Freq: Every day | NASAL | Status: DC | PRN
  Filled 2018-12-09: qty 16

## 2018-12-09 SURGICAL SUPPLY — 35 items
APPLIER CLIP 5 13 M/L LIGAMAX5 (MISCELLANEOUS)
APR CLP MED LRG 5 ANG JAW (MISCELLANEOUS)
BAG SPEC RTRVL 10 TROC 200 (ENDOMECHANICALS)
CABLE HIGH FREQUENCY MONO STRZ (ELECTRODE) ×3 IMPLANT
CHLORAPREP W/TINT 26ML (MISCELLANEOUS) ×3 IMPLANT
CLIP APPLIE 5 13 M/L LIGAMAX5 (MISCELLANEOUS) IMPLANT
CLIP VESOLOCK MED LG 6/CT (CLIP) ×3 IMPLANT
COVER MAYO STAND STRL (DRAPES) IMPLANT
COVER TRANSDUCER ULTRASND (DRAPES) IMPLANT
COVER WAND RF STERILE (DRAPES) ×3 IMPLANT
DECANTER SPIKE VIAL GLASS SM (MISCELLANEOUS) IMPLANT
DERMABOND ADVANCED (GAUZE/BANDAGES/DRESSINGS) ×2
DERMABOND ADVANCED .7 DNX12 (GAUZE/BANDAGES/DRESSINGS) ×1 IMPLANT
DRAPE C-ARM 42X120 X-RAY (DRAPES) IMPLANT
ELECT REM PT RETURN 15FT ADLT (MISCELLANEOUS) ×3 IMPLANT
GLOVE BIO SURGEON STRL SZ7.5 (GLOVE) IMPLANT
GOWN STRL REUS W/TWL XL LVL3 (GOWN DISPOSABLE) ×12 IMPLANT
GRASPER SUT TROCAR 14GX15 (MISCELLANEOUS) IMPLANT
HEMOSTAT SNOW SURGICEL 2X4 (HEMOSTASIS) ×3 IMPLANT
HEMOSTAT SURGICEL 4X8 (HEMOSTASIS) IMPLANT
KIT BASIN OR (CUSTOM PROCEDURE TRAY) ×3 IMPLANT
NEEDLE INSUFFLATION 14GA 120MM (NEEDLE) ×3 IMPLANT
POUCH RETRIEVAL ECOSAC 10 (ENDOMECHANICALS) IMPLANT
POUCH RETRIEVAL ECOSAC 10MM (ENDOMECHANICALS)
SCISSORS LAP 5X35 DISP (ENDOMECHANICALS) ×3 IMPLANT
SET CHOLANGIOGRAPH MIX (MISCELLANEOUS) IMPLANT
SET IRRIG TUBING LAPAROSCOPIC (IRRIGATION / IRRIGATOR) ×3 IMPLANT
SLEEVE XCEL OPT CAN 5 100 (ENDOMECHANICALS) ×3 IMPLANT
SUT MNCRL AB 4-0 PS2 18 (SUTURE) ×3 IMPLANT
TOWEL OR 17X26 10 PK STRL BLUE (TOWEL DISPOSABLE) ×3 IMPLANT
TOWEL OR NON WOVEN STRL DISP B (DISPOSABLE) ×3 IMPLANT
TRAY LAPAROSCOPIC (CUSTOM PROCEDURE TRAY) ×3 IMPLANT
TROCAR BLADELESS OPT 5 100 (ENDOMECHANICALS) ×3 IMPLANT
TROCAR XCEL NON-BLD 11X100MML (ENDOMECHANICALS) ×3 IMPLANT
TUBING INSUF HEATED (TUBING) ×3 IMPLANT

## 2018-12-09 NOTE — Transfer of Care (Signed)
Immediate Anesthesia Transfer of Care Note  Patient: Chad [name].  Procedure(s) Performed: LAPAROSCOPIC CHOLECYSTECTOMY (N/A Abdomen)  Patient Location: PACU  Anesthesia Type:General  Level of Consciousness: drowsy and patient cooperative  Airway & Oxygen Therapy: Patient Spontanous Breathing and Patient connected to face mask oxygen  Post-op Assessment: Report given to RN and Post -op Vital signs reviewed and stable  Post vital signs: Reviewed and stable  Last Vitals:  Vitals Value Taken Time  BP 131/89 12/09/2018  1:00 PM  Temp    Pulse 69 12/09/2018  1:01 PM  Resp 15 12/09/2018  1:01 PM  SpO2 100 % 12/09/2018  1:01 PM  Vitals shown include unvalidated device data.  Last Pain:  Vitals:   12/09/18 0958  TempSrc: Oral  PainSc:       Patients Stated Pain Goal: 4 (34/94/94 4739)  Complications: No apparent anesthesia complications

## 2018-12-09 NOTE — Progress Notes (Signed)
Dr Ninfa Linden paged and made aware of ekg results D Mateo Flow RN

## 2018-12-09 NOTE — Anesthesia Preprocedure Evaluation (Signed)
Anesthesia Evaluation  Patient identified by MRN, date of birth, ID band Patient awake    Reviewed: Allergy & Precautions, H&P , NPO status , Patient's Chart, lab work & pertinent test results  History of Anesthesia Complications (+) PONV and history of anesthetic complications  Airway Mallampati: II   Neck ROM: full    Dental   Pulmonary shortness of breath, asthma , sleep apnea ,    breath sounds clear to auscultation       Cardiovascular hypertension, + angina + CAD, + Past MI, + Cardiac Stents, + CABG and +CHF  + Cardiac Defibrillator + Valvular Problems/Murmurs  Rhythm:regular Rate:Normal  Cleared by cardiologist (Bensimhon) as high risk for cardiac complications including death.   Neuro/Psych PSYCHIATRIC DISORDERS Anxiety  Neuromuscular disease    GI/Hepatic GERD  ,  Endo/Other    Renal/GU Renal InsufficiencyRenal disease     Musculoskeletal  (+) Arthritis ,   Abdominal   Peds  Hematology   Anesthesia Other Findings   Reproductive/Obstetrics                             Anesthesia Physical Anesthesia Plan  ASA: IV  Anesthesia Plan: General   Post-op Pain Management:    Induction: Intravenous  PONV Risk Score and Plan: 3 and Ondansetron, Dexamethasone, Midazolam and Treatment may vary due to age or medical condition  Airway Management Planned: Oral ETT  Additional Equipment: Arterial line  Intra-op Plan:   Post-operative Plan: Extubation in OR  Informed Consent: I have reviewed the patients History and Physical, chart, labs and discussed the procedure including the risks, benefits and alternatives for the proposed anesthesia with the patient or authorized representative who has indicated his/her understanding and acceptance.     Plan Discussed with: CRNA, Anesthesiologist and Surgeon  Anesthesia Plan Comments:         Anesthesia Quick Evaluation

## 2018-12-09 NOTE — Anesthesia Procedure Notes (Signed)
Arterial Line Insertion Start/End12/16/2019 10:44 AM, 12/09/2018 10:54 AM Performed by: Albertha Ghee, MD, anesthesiologist  Patient location: Pre-op. Preanesthetic checklist: patient identified, IV checked, site marked, risks and benefits discussed, surgical consent, monitors and equipment checked, pre-op evaluation, timeout performed and anesthesia consent Lidocaine 1% used for infiltration Right, radial was placed Catheter size: 20 Fr Hand hygiene performed  and maximum sterile barriers used   Attempts: 1 Procedure performed without using ultrasound guided technique. Following insertion, dressing applied and Biopatch. Post procedure assessment: normal and unchanged  Patient tolerated the procedure well with no immediate complications.

## 2018-12-09 NOTE — Progress Notes (Signed)
Patient having increased chest pain. Paged Dr. Ninfa Linden.

## 2018-12-09 NOTE — Plan of Care (Signed)
Plan of care 

## 2018-12-09 NOTE — Progress Notes (Signed)
Patient complains of chest pain Dr Ninfa Linden on call paged

## 2018-12-09 NOTE — Discharge Instructions (Signed)
[facility] ______[facility] °LAPAROSCOPIC SURGERY: POST OP INSTRUCTIONS °Always review your discharge instruction sheet given to you by the facility where your surgery was performed. °IF YOU HAVE DISABILITY OR FAMILY LEAVE FORMS, YOU MUST BRING THEM TO THE OFFICE FOR PROCESSING.   °DO NOT GIVE THEM TO YOUR DOCTOR. ° °1. A prescription for pain medication may be given to you upon discharge.  Take your pain medication as prescribed, if needed.  If narcotic pain medicine is not needed, then you may take acetaminophen (Tylenol) or ibuprofen (Advil) as needed. °2. Take your usually prescribed medications unless otherwise directed. °3. If you need a refill on your pain medication, please contact your pharmacy.  They will contact our office to request authorization. Prescriptions will not be filled after 5pm or on week-ends. °4. You should follow a light diet the first few days after arrival home, such as soup and crackers, etc.  Be sure to include lots of fluids daily. °5. Most patients will experience some swelling and bruising in the area of the incisions.  Ice packs will help.  Swelling and bruising can take several days to resolve.  °6. It is common to experience some constipation if taking pain medication after surgery.  Increasing fluid intake and taking a stool softener (such as Colace) will usually help or prevent this problem from occurring.  A mild laxative (Milk of Magnesia or Miralax) should be taken according to package instructions if there are no bowel movements after 48 hours. °7. Unless discharge instructions indicate otherwise, you may remove your bandages 24-48 hours after surgery, and you may shower at that time.  You may have steri-strips (small skin tapes) in place directly over the incision.  These strips should be left on the skin for 7-10 days.  If your surgeon used skin glue on the incision, you may shower in 24 hours.  The glue will flake off over the next 2-3 weeks.  Any sutures or  staples will be removed at the office during your follow-up visit. °8. ACTIVITIES:  You may resume regular (light) daily activities beginning the next day--such as daily self-care, walking, climbing stairs--gradually increasing activities as tolerated.  You may have sexual intercourse when it is comfortable.  Refrain from any heavy lifting or straining until approved by your doctor. °a. You may drive when you are no longer taking prescription pain medication, you can comfortably wear a seatbelt, and you can safely maneuver your car and apply brakes. °b. RETURN TO WORK:  __________________________________________________________ °9. You should see your doctor in the office for a follow-up appointment approximately 2-3 weeks after your surgery.  Make sure that you call for this appointment within a day or two after you arrive home to insure a convenient appointment time. °10. OTHER INSTRUCTIONS: __________________________________________________________________________________________________________________________ __________________________________________________________________________________________________________________________ °WHEN TO CALL YOUR DOCTOR: °1. Fever over 101.0 °2. Inability to urinate °3. Continued bleeding from incision. °4. Increased pain, redness, or drainage from the incision. °5. Increasing abdominal pain ° °The clinic staff is available to answer your questions during regular business hours.  Please don’t hesitate to call and ask to speak to one of the nurses for clinical concerns.  If you have a medical emergency, go to the nearest emergency room or call 911.  A surgeon from [facility] is always on call at the hospital. °[address], [address], [place], [place]  [address] ? [address] [address], [place], [place]   [address] °[phone] ? [phone] ? FAX [phone] °Web site:   [email or web address] °

## 2018-12-09 NOTE — Significant Event (Signed)
Rapid Response Event Note  Overview: Time Called: 1720 Arrival Time: 5826 Event Type: Cardiac  Initial Focused Assessment:   Upon arrival patient lying comfortably in bed and had already received the nitro sublingual. Patient now reports that his chest pain had subsided and was no longer there. Vital signs stable post nitro dose. 12 lead preformed. MD notified. Patient reports he knows the difference between the gas from lap surgeries pain and the chest pain  He has typically felt due to extensive cardiac history. Lab present to take lab work for troponin's. Patient now eating his dinner and is requesting to walk in the hallways.     Plan of Care (if not transferred):  MD ordered Troponin, 12 lead, and Nitro which is a home medication for this patient.    Yanelis Osika A Haruka Kowaleski

## 2018-12-09 NOTE — Progress Notes (Signed)
Nitrostat given for complaints of chest pain VSS patient alert and oriented. States it feels like someone is sitting on my chest. Denies gas pain D Clinical research associate

## 2018-12-09 NOTE — Progress Notes (Signed)
Dr Ninfa Linden called orders received d Mateo Flow

## 2018-12-09 NOTE — Progress Notes (Signed)
Patient feels relief from nitro 0.4 SL given. Rapid response at bedside  D [facility]

## 2018-12-09 NOTE — Interval H&P Note (Signed)
History and Physical Interval Note:  12/09/2018 9:35 AM  Chad [name].  has presented today for surgery, with the diagnosis of gallstones  The various methods of treatment have been discussed with the patient and family. After consideration of risks, benefits and other options for treatment, the patient has consented to  Procedure(s): LAPAROSCOPIC CHOLECYSTECTOMY (N/A) as a surgical intervention .  The patient's history has been reviewed, patient examined, no change in status, stable for surgery.  I have reviewed the patient's chart and labs.  Questions were answered to the patient's satisfaction.     Ralene Ok

## 2018-12-09 NOTE — Op Note (Signed)
12/09/2018  12:44 PM  PATIENT:  Chad [name].  57 y.o. male  PRE-OPERATIVE DIAGNOSIS:  gallstones  POST-OPERATIVE DIAGNOSIS:  gallstones  PROCEDURE:  Procedure(s): LAPAROSCOPIC CHOLECYSTECTOMY (N/A)  SURGEON:  Surgeon(s) and Role:    * Ralene Ok, MD - Primary   ANESTHESIA:   local and general  EBL:  10cc   BLOOD ADMINISTERED:none  DRAINS: none   LOCAL MEDICATIONS USED:  BUPIVICAINE   SPECIMEN:  Source of Specimen:  gallbladder  DISPOSITION OF SPECIMEN:  PATHOLOGY  COUNTS:  YES  TOURNIQUET:  * No tourniquets in log *  DICTATION: .Dragon Dictation The patient was taken to the operating and placed in the supine position with bilateral SCDs in place.  The patient was prepped and draped in the usual sterile fashion. A time out was called and all facts were verified. A pneumoperitoneum was obtained via A Veress needle technique to a pressure of 48mm of mercury.  A 72mm trochar was then placed in the right upper quadrant under visualization, and there were no injuries to any abdominal organs. A 11 mm port was then placed in the umbilical region after infiltrating with local anesthesia under direct visualization. A second and third epigastric port and right lower quadrant port placement under direct visualization, respectively.    The gallbladder was identified and retracted, the peritoneum was then sharply dissected from the gallbladder and this dissection was carried down to Calot's triangle. The gallbladder was identified and stripped away circumferentially and seen going into the gallbladder 360, the critical angle was obtained.  2 clips were placed proximally one distally and the cystic duct transected. The cystic artery was identified and 2 clips placed proximally and one distally and transected.  We then proceeded to remove the gallbladder off the hepatic fossa with Bovie cautery. A retrieval bag was then placed in the abdomen and gallbladder placed in the bag.  The hepatic fossa was then reexamined and hemostasis was achieved with Bovie cautery and was excellent at the end of the case.   The subhepatic fossa and perihepatic fossa was then irrigated until the effluent was clear.  Surgicel snow was placed in the gallbladder fossa. The gallbladder and bag were removed from the abdominal cavity. The 11 mm trocar fascia was reapproximated with the Endo Close #1 Vicryl x2.  The pneumoperitoneum was evacuated and all trochars removed under direct visulalization.  The skin was then closed with 4-0 Monocryl and the skin dressed with Dermabond.    The patient was awaken from general anesthesia and taken to the recovery room in stable condition.   PLAN OF CARE: Admit for overnight observation  PATIENT DISPOSITION:  PACU - hemodynamically stable.   Delay start of Pharmacological VTE agent (>24hrs) due to surgical blood loss or risk of bleeding: yes

## 2018-12-09 NOTE — Anesthesia Procedure Notes (Signed)
Procedure Name: Intubation Date/Time: 12/09/2018 12:08 PM Performed by: Montel Clock, CRNA Pre-anesthesia Checklist: Patient identified, Emergency Drugs available, Suction available, Patient being monitored and Timeout performed Patient Re-evaluated:Patient Re-evaluated prior to induction Oxygen Delivery Method: Circle system utilized Preoxygenation: Pre-oxygenation with 100% oxygen Induction Type: IV induction Ventilation: Mask ventilation without difficulty and Oral airway inserted - appropriate to patient size Laryngoscope Size: Mac and 3 Grade View: Grade I Tube type: Oral Tube size: 7.5 mm Number of attempts: 1 Airway Equipment and Method: Stylet Placement Confirmation: ETT inserted through vocal cords under direct vision,  positive ETCO2 and breath sounds checked- equal and bilateral Secured at: 23 cm Tube secured with: Tape Dental Injury: Teeth and Oropharynx as per pre-operative assessment

## 2018-12-10 ENCOUNTER — Encounter (HOSPITAL_COMMUNITY): Payer: Self-pay | Admitting: General Surgery

## 2018-12-10 DIAGNOSIS — K8010 Calculus of gallbladder with chronic cholecystitis without obstruction: Secondary | ICD-10-CM | POA: Diagnosis not present

## 2018-12-10 LAB — TROPONIN I: Troponin I: <0.03 ng/mL (ref <0.03)

## 2018-12-10 MED ORDER — OXYCODONE HCL 5 MG PO TABS: 5.0000 mg | ORAL_TABLET | Freq: Once | ORAL | Status: DC

## 2018-12-10 MED ORDER — OXYCODONE HCL 5 MG PO TABS: 5.0000 mg | ORAL_TABLET | Freq: Once | ORAL | 0 refills | Status: AC

## 2018-12-10 MED ORDER — MORPHINE SULFATE (PF) 2 MG/ML IV SOLN
1.0000 mg | INTRAVENOUS | Status: DC | PRN
  Administered 2018-12-10: 2 mg via INTRAVENOUS
  Filled 2018-12-10: qty 1

## 2018-12-10 NOTE — Progress Notes (Signed)
Patient refusing home BP meds at this time due to BP of 110/70. Patient states he does this at home if his blood pressure is in this range and usually takes later in the day. Patient did take PO Lasix/K+. Patient is to be d/c home after breakfast and ambulating per MD.   12/10/18 0807  Vitals  BP 110/70  MAP (mmHg) 83  BP Method Automatic  Pulse Rate 70  Oxygen Therapy  SpO2 95 %  O2 Device Room [employer]

## 2018-12-10 NOTE — Discharge Summary (Signed)
Physician Discharge Summary  Patient ID: Chad [name]. MRN: [id number] DOB/AGE: 1961/05/18 57 y.o.  Admit date: 12/09/2018 Discharge date: 12/10/2018  Admission Diagnoses: Status post lap cholecystectomy  Discharge Diagnoses:  Active Problems:   S/P laparoscopic cholecystectomy   Discharged Condition: good  Hospital Course: Patient was admitted postop to the floor.  Patient did have some epigastric/chest pain.  Patient underwent troponins as well as EKGs.  There was minimal change in EKG and troponins were all negative.  On postop day 1 patient had no chest pain.  Patient did have some abdominal soreness.  Patient was otherwise ambulating well on his own, had tolerated normal diet, was afebrile, deemed stable for discharge and discharged home.  Patient to resume Plavix on postop day 2.  I discussed this with him personally.  Consults: None  Significant Diagnostic Studies: Troponins within normal limits  Treatments: surgery: as above  Discharge Exam: Blood pressure 94/65, pulse 78, temperature 99.1 F (37.3 C), temperature source Oral, resp. rate 18, height 5\' 11"  (1.803 m), weight 75 kg, SpO2 92 %. General appearance: alert and cooperative GI: soft, non-tender; bowel sounds normal; no masses,  no organomegaly, inc c/d/i  Disposition: Discharge disposition: 01-Home or Self Care       Discharge Instructions    Diet - low sodium heart healthy   Complete by:  As directed    Increase activity slowly   Complete by:  As directed      Allergies as of 12/10/2018      Reactions   Contrast Media [iodinated Diagnostic Agents] Anaphylaxis   Iohexol Anaphylaxis, Other (See Comments)   PT HAS ANAPHYLAXIS WITH CONTRAST MEDIA!   Lipitor [atorvastatin Calcium] Anaphylaxis, Other (See Comments)   Large doses   Shellfish Allergy Anaphylaxis   Sulfa Antibiotics Shortness Of Breath, Swelling   Sulfonamide Derivatives Shortness Of Breath, Swelling   Almond Oil Itching   FACIAL/MOUTH ITCHING   Metrizamide Swelling   SWELLING REACTION UNSPECIFIED    Zocor [simvastatin] Other (See Comments)   Muscle cramps   Latex Rash, Other (See Comments)   With long periods of exposure   Peach [prunus Persica] Itching      Medication List    STOP taking these medications   dicyclomine 20 MG tablet Commonly known as:  BENTYL     TAKE these medications   albuterol 108 (90 Base) MCG/ACT inhaler Commonly known as:  PROVENTIL HFA;VENTOLIN HFA Inhale 2 puffs into the lungs every 6 (six) hours as needed for wheezing or shortness of breath.   aspirin EC 81 MG tablet Take 1 tablet (81 mg total) by mouth daily. Can take 4 tabs (324 mg total) as needed for chest pain. DO NOT TAKE multiple 325 mg tabs.   carvedilol 25 MG tablet Commonly known as:  COREG TAKE 1 TABLET BY MOUTH TWICE DAILY WITH  A  MEAL What changed:    how much to take  how to take this  when to take this  additional instructions   clopidogrel 75 MG tablet Commonly known as:  PLAVIX Take 1 tablet (75 mg total) by mouth daily.   doxazosin 1 MG tablet Commonly known as:  CARDURA Take 1 tablet (1 mg total) by mouth every 12 (twelve) hours as needed (for systolic BP > 465 mm Hg.).   fluticasone 50 MCG/ACT nasal spray Commonly known as:  FLONASE Place 2 sprays into both nostrils daily as needed for allergies or rhinitis.   furosemide 80 MG tablet Commonly known as:  LASIX Take 1 tablet (80 mg total) by mouth 2 (two) times daily.   hydrALAZINE 50 MG tablet Commonly known as:  APRESOLINE Take 1.5 tablets (75 mg total) by mouth 2 (two) times daily.   ipratropium-albuterol 0.5-2.5 (3) MG/3ML Soln Commonly known as:  DUONEB Take 3 mLs by nebulization every 6 (six) hours as needed. What changed:  reasons to take this   isosorbide mononitrate 60 MG 24 hr tablet Commonly known as:  IMDUR Take 1 tablet (60 mg total) by mouth 2 (two) times daily.   MITIGARE 0.6 MG Caps Generic drug:   Colchicine TAKE 2 CAPSULES BY MOUTH WHEN YOU FEEL AN ATTACK COMING ON, TAKE 1 CAPSULE THE FOLLOWING DAY What changed:  See the new instructions.   nitroGLYCERIN 0.4 MG SL tablet Commonly known as:  NITROSTAT PLACE 1 TABLET UNDER THE TONGUE EVERY 5 MINUTES FOR 3 DOSES AS NEEDED FOR CHEST PAIN What changed:  See the new instructions.   Oxycodone HCl 10 MG Tabs Take 20 mg by mouth every 8 (eight) hours. What changed:  Another medication with the same name was added. Make sure you understand how and when to take each.   oxyCODONE 5 MG immediate release tablet Commonly known as:  Oxy IR/ROXICODONE Take 1 tablet (5 mg total) by mouth once for 1 dose. What changed:  You were already taking a medication with the same name, and this prescription was added. Make sure you understand how and when to take each.   potassium chloride 10 MEQ tablet Commonly known as:  KLOR-CON M10 Take 1 tablet (10 mEq total) by mouth daily.   sacubitril-valsartan 97-103 MG Commonly known as:  ENTRESTO Take 1 tablet by mouth 2 (two) times daily.   spironolactone 25 MG tablet Commonly known as:  ALDACTONE Take 0.5 tablets (12.5 mg total) by mouth daily.   SUPER B COMPLEX PO Take 3 tablets by mouth daily.      Follow-up Information    Ralene Ok, MD. Schedule an appointment as soon as possible for a visit in 2 weeks.   Specialty:  General Surgery Why:  For wound re-check Contact information: [place] [address] [phone]           Signed: Ralene Ok 12/10/2018, 7:42 AM

## 2018-12-10 NOTE — Anesthesia Postprocedure Evaluation (Signed)
Anesthesia Post Note  Patient: Chad [name].  Procedure(s) Performed: LAPAROSCOPIC CHOLECYSTECTOMY (N/A Abdomen)     Patient location during evaluation: PACU Anesthesia Type: General Level of consciousness: awake and alert Pain management: pain level controlled Vital Signs Assessment: post-procedure vital signs reviewed and stable Respiratory status: spontaneous breathing, nonlabored ventilation, respiratory function stable and patient connected to nasal cannula oxygen Cardiovascular status: blood pressure returned to baseline and stable Postop Assessment: no apparent nausea or vomiting Anesthetic complications: no    Last Vitals:  Vitals:   12/10/18 0532 12/10/18 0807  BP: 94/65 110/70  Pulse: 78 70  Resp: 18   Temp: 37.3 C   SpO2: 92% 95%    Last Pain:  Vitals:   12/10/18 0807  TempSrc:   PainSc: [place]

## 2018-12-10 NOTE — Progress Notes (Signed)
1 Day Post-Op   Subjective/Chief Complaint: Pt doing OK this AM Appears pain overnight was related to gas pain from pneumoperitoneum. Feeling approp sore this AM   Objective: Vital signs in last 24 hours: Temp:  [97.4 F (36.3 C)-99.3 F (37.4 C)] 99.1 F (37.3 C) (12/17 0532) Pulse Rate:  [60-84] 78 (12/17 0532) Resp:  [10-22] 18 (12/17 0532) BP: (94-131)/(65-91) 94/65 (12/17 0532) SpO2:  [92 %-100 %] 92 % (12/17 0532) Arterial Line BP: (139)/(77) 139/77 (12/16 1300) Weight:  [75 kg-75.3 kg] 75 kg (12/16 1604) Last BM Date: 12/08/18  Intake/Output from previous day: 12/16 0701 - 12/17 0700 In: 2691.1 [P.O.:1080; I.V.:1611.1] Out: 370 [Urine:350; Blood:20] Intake/Output this shift: No intake/output data recorded.  General appearance: alert and cooperative GI: soft, non-tender; bowel sounds normal; no masses,  no organomegaly Anti-infectives: Anti-infectives (From admission, onward)   Start     Dose/Rate Route Frequency Ordered Stop   12/09/18 1000  ceFAZolin (ANCEF) IVPB 2g/100 mL premix     2 g 200 mL/hr over 30 Minutes Intravenous On call to O.R. 12/09/18 0951 12/09/18 1206   12/09/18 0935  ceFAZolin (ANCEF) 2-4 GM/100ML-% IVPB    Note to Pharmacy:  Kyra Leyland   : cabinet override      12/09/18 0935 12/09/18 1156      Assessment/Plan: s/p Procedure(s): LAPAROSCOPIC CHOLECYSTECTOMY (N/A) Plan home today Resume home rxs   LOS: 0 days    Ralene Ok 12/10/2018

## 2018-12-10 NOTE — Progress Notes (Addendum)
Pt states that the 1 mg of Dilaudid given @ 0100 was not managing his pain. Called Dr. Evlyn Courier and new orders rec'd for  5 Mg of Oxycodone IR PO once, and Morphine 1-4 Mg  IV Q 1 hr PRN.  Pt also C/O his blood pressure being too high (127/87). The parameters for his Cardura were SBP > 160. I explained the order to him. At 0315 his BP was taken again per his request and it was 130/87. He requested I call the Dr. So he could take some BP meds. I spoke w/ CN and it was decided that we would follow orders and not contact Dr. Abbott Pao stated that he had his meds with him in the room and that they were going to stay with him in the room. Pt was concerned about his BP D/T his heart Hx and because he only has 1 kidney.

## 2018-12-12 ENCOUNTER — Telehealth: Payer: Self-pay | Admitting: *Deleted

## 2018-12-12 ENCOUNTER — Other Ambulatory Visit: Payer: Self-pay | Admitting: Family Medicine

## 2018-12-12 DIAGNOSIS — M542 Cervicalgia: Secondary | ICD-10-CM | POA: Diagnosis not present

## 2018-12-12 DIAGNOSIS — M545 Low back pain: Secondary | ICD-10-CM | POA: Diagnosis not present

## 2018-12-12 DIAGNOSIS — R1907 Generalized intra-abdominal and pelvic swelling, mass and lump: Secondary | ICD-10-CM

## 2018-12-12 DIAGNOSIS — G894 Chronic pain syndrome: Secondary | ICD-10-CM | POA: Diagnosis not present

## 2018-12-12 DIAGNOSIS — M79661 Pain in right lower leg: Secondary | ICD-10-CM | POA: Diagnosis not present

## 2018-12-12 DIAGNOSIS — M79662 Pain in left lower leg: Secondary | ICD-10-CM | POA: Diagnosis not present

## 2018-12-12 NOTE — Telephone Encounter (Signed)
In speaking with scheduling for this, pt is allergic to contrast and wanted to see if you wanted to order it without or if there was something else you would like to order. Please advise and change and then will get this scheduled.Katharina Caper, Reino Lybbert D, [place]

## 2018-12-12 NOTE — Telephone Encounter (Signed)
-----   Message from Kathrene Alu, MD sent at 12/06/2018  5:18 PM EST ----- Patient informed of results, MRI ordered.  Will need to call and schedule this after his upcoming cholecystectomy.

## 2018-12-12 NOTE — Telephone Encounter (Signed)
Thanks April for your help with this.  I have ordered an MRI without contrast.

## 2018-12-12 NOTE — Telephone Encounter (Signed)
Contacted pt and informed him that there would be a bit of a delay due to him having allergy to contrast.  Explained that I sent message back to doctor to see what she wants to do, and would get back to him with appointment once she places it. Katharina Caper, Franceska Strahm D, [place]

## 2018-12-13 ENCOUNTER — Other Ambulatory Visit: Payer: Self-pay | Admitting: Family Medicine

## 2018-12-13 DIAGNOSIS — R1907 Generalized intra-abdominal and pelvic swelling, mass and lump: Secondary | ICD-10-CM

## 2018-12-13 NOTE — Telephone Encounter (Signed)
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  Appointment is on 12/20/2018 @ 4:00pm with a 3:45pm arrival at Chad [name], Nelline Lio D, [place]

## 2018-12-13 NOTE — Addendum Note (Signed)
Addended by: Katharina Caper, APRIL D on: 12/13/2018 03:22 PM   Modules accepted: Orders

## 2018-12-20 ENCOUNTER — Ambulatory Visit (HOSPITAL_COMMUNITY)
Admission: RE | Admit: 2018-12-20 | Discharge: 2018-12-20 | Disposition: A | Discharge status: Home / Self Care | Payer: Medicaid Other | Source: Ambulatory Visit | Attending: Family Medicine | Admitting: Family Medicine

## 2018-12-20 DIAGNOSIS — R1907 Generalized intra-abdominal and pelvic swelling, mass and lump: Secondary | ICD-10-CM | POA: Diagnosis not present

## 2018-12-20 DIAGNOSIS — M7989 Other specified soft tissue disorders: Secondary | ICD-10-CM | POA: Diagnosis not present

## 2018-12-26 ENCOUNTER — Ambulatory Visit: Payer: Medicaid Other | Admitting: Family Medicine

## 2018-12-26 ENCOUNTER — Other Ambulatory Visit: Payer: Self-pay | Admitting: Family Medicine

## 2018-12-26 ENCOUNTER — Inpatient Hospital Stay (HOSPITAL_COMMUNITY): Admission: RE | Admit: 2018-12-26 | Payer: Medicaid Other | Source: Ambulatory Visit

## 2018-12-26 DIAGNOSIS — R2242 Localized swelling, mass and lump, left lower limb: Secondary | ICD-10-CM

## 2018-12-27 ENCOUNTER — Other Ambulatory Visit (HOSPITAL_COMMUNITY): Payer: Self-pay

## 2018-12-29 ENCOUNTER — Other Ambulatory Visit (HOSPITAL_COMMUNITY): Payer: Self-pay | Admitting: Internal Medicine

## 2018-12-30 ENCOUNTER — Other Ambulatory Visit (HOSPITAL_COMMUNITY): Payer: Self-pay

## 2019-01-07 LAB — CUP PACEART REMOTE DEVICE CHECK
Battery Remaining Longevity: 90 mo
Battery Remaining Percentage: 93 %
Brady Statistic RV Percent Paced: 0 %
Date Time Interrogation Session: 20191118134600
HighPow Impedance: 55 Ohm
Implantable Lead Implant Date: 20171114
Implantable Lead Location: 753860
Implantable Lead Model: 181
Lead Channel Impedance Value: 461 Ohm
Lead Channel Pacing Threshold Amplitude: 0.8 V
Lead Channel Setting Pacing Amplitude: 3.5 V
Lead Channel Setting Pacing Pulse Width: 0.4 ms
Lead Channel Setting Sensing Sensitivity: 0.5 mV
MDC IDC LEAD SERIAL: 333496
MDC IDC MSMT LEADCHNL RV PACING THRESHOLD PULSEWIDTH: 0.4 ms
MDC IDC PG IMPLANT DT: 20131220
Pulse Gen Serial Number: 124654

## 2019-01-17 DIAGNOSIS — M79662 Pain in left lower leg: Secondary | ICD-10-CM | POA: Diagnosis not present

## 2019-01-17 DIAGNOSIS — M542 Cervicalgia: Secondary | ICD-10-CM | POA: Diagnosis not present

## 2019-01-17 DIAGNOSIS — G894 Chronic pain syndrome: Secondary | ICD-10-CM | POA: Diagnosis not present

## 2019-01-17 DIAGNOSIS — M79661 Pain in right lower leg: Secondary | ICD-10-CM | POA: Diagnosis not present

## 2019-01-30 ENCOUNTER — Encounter (HOSPITAL_COMMUNITY): Payer: Medicaid Other | Admitting: Internal Medicine

## 2019-01-31 ENCOUNTER — Encounter (HOSPITAL_COMMUNITY): Payer: Medicaid Other | Admitting: Internal Medicine

## 2019-02-10 ENCOUNTER — Ambulatory Visit: Payer: Medicaid Other

## 2019-02-11 LAB — CUP PACEART REMOTE DEVICE CHECK
Battery Remaining Longevity: 90 mo
Battery Remaining Percentage: 93 %
Brady Statistic RV Percent Paced: 0 %
HighPow Impedance: 52 Ohm
Implantable Lead Implant Date: 20171114
Implantable Lead Location: 753860
Implantable Lead Model: 181
Implantable Pulse Generator Implant Date: 20131220
Lead Channel Impedance Value: 452 Ohm
Lead Channel Pacing Threshold Pulse Width: 0.4 ms
Lead Channel Setting Pacing Amplitude: 3.5 V
Lead Channel Setting Pacing Pulse Width: 0.4 ms
Lead Channel Setting Sensing Sensitivity: 0.5 mV
MDC IDC LEAD SERIAL: 333496
MDC IDC MSMT LEADCHNL RV PACING THRESHOLD AMPLITUDE: 0.8 V
MDC IDC SESS DTM: 20200218035200
Pulse Gen Serial Number: 124654

## 2019-02-21 DIAGNOSIS — M79661 Pain in right lower leg: Secondary | ICD-10-CM | POA: Diagnosis not present

## 2019-02-21 DIAGNOSIS — M542 Cervicalgia: Secondary | ICD-10-CM | POA: Diagnosis not present

## 2019-02-21 DIAGNOSIS — G894 Chronic pain syndrome: Secondary | ICD-10-CM | POA: Diagnosis not present

## 2019-02-21 DIAGNOSIS — M79662 Pain in left lower leg: Secondary | ICD-10-CM | POA: Diagnosis not present

## 2019-02-21 IMAGING — DX DG HIP (WITH OR WITHOUT PELVIS) 2-3V*L*
3 series · 3 of 3 positions shown · non-contrast
Comparison: None.

CLINICAL DATA: Soft tissue mass along the left posterior buttock

EXAM:
DG HIP (WITH OR WITHOUT PELVIS) 2-3V LEFT

[pelvis ap]
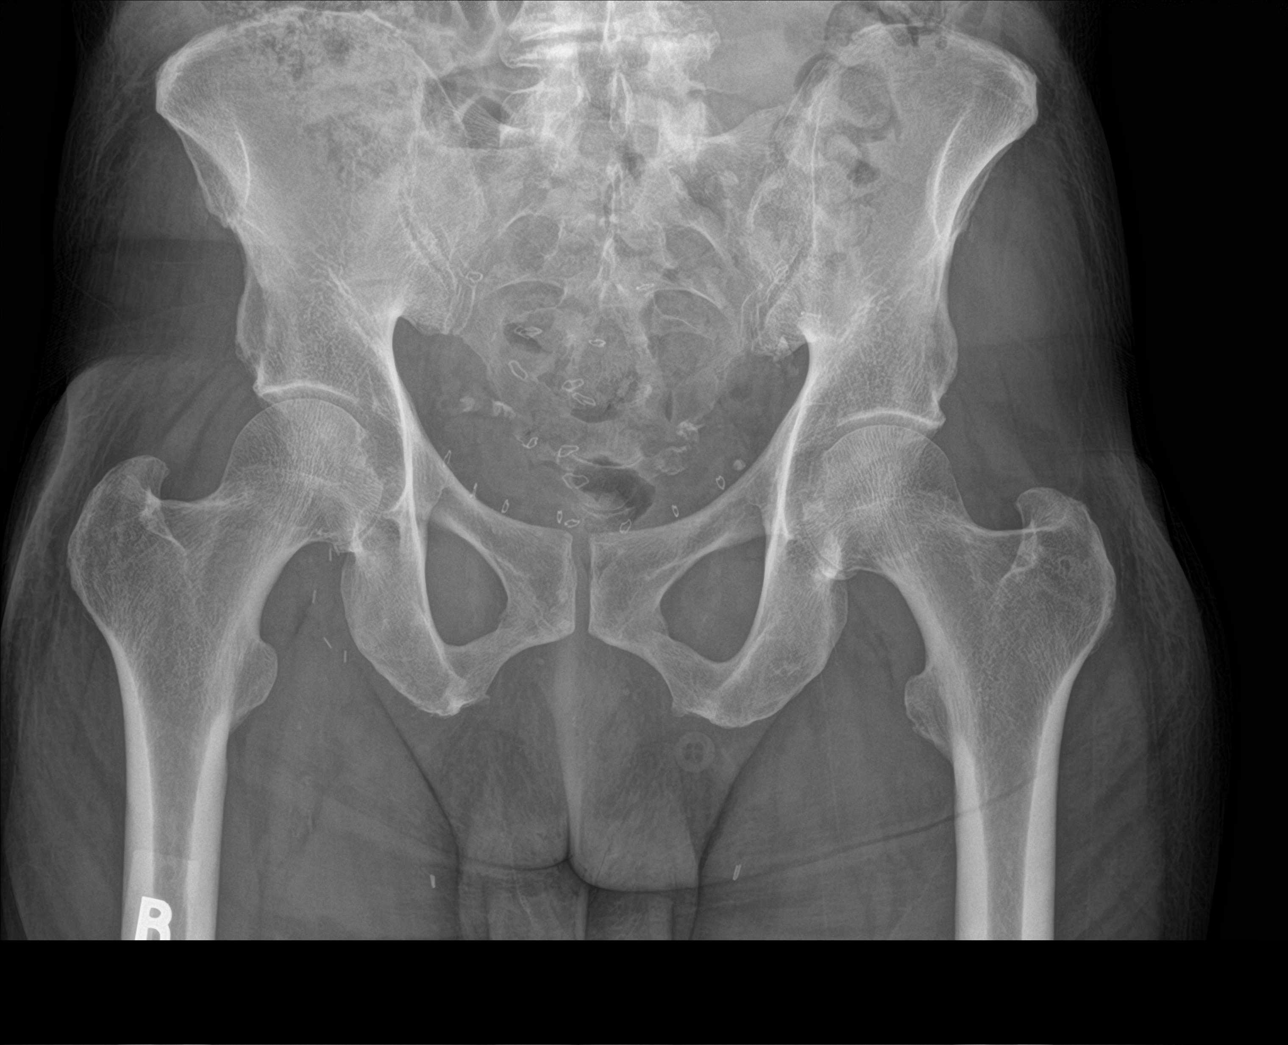

[hip lat]
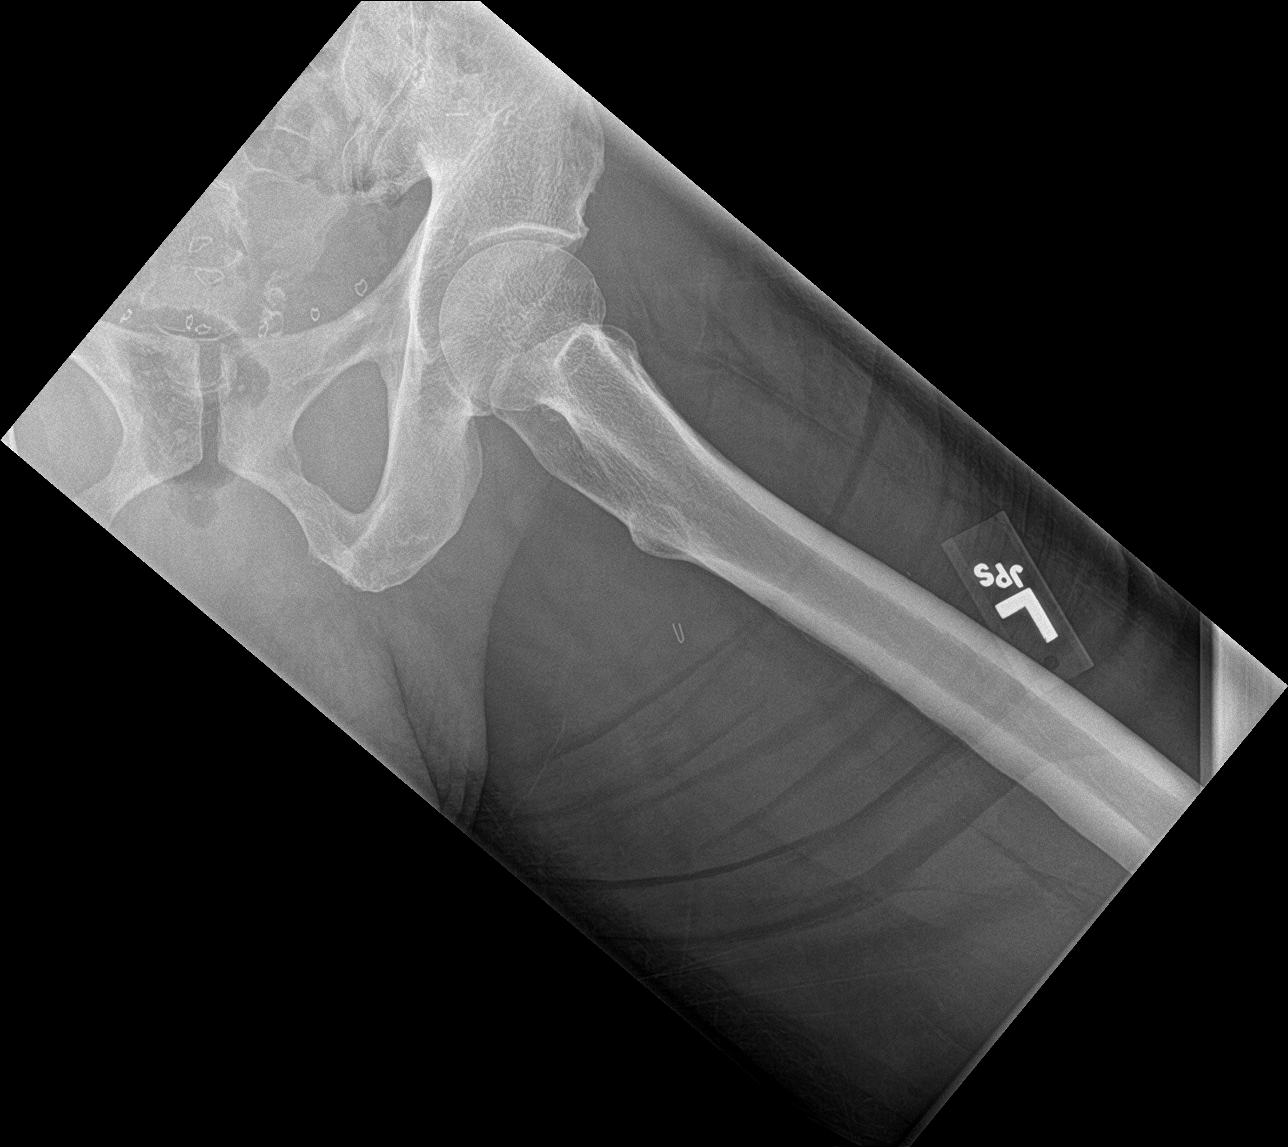

[hip ap]
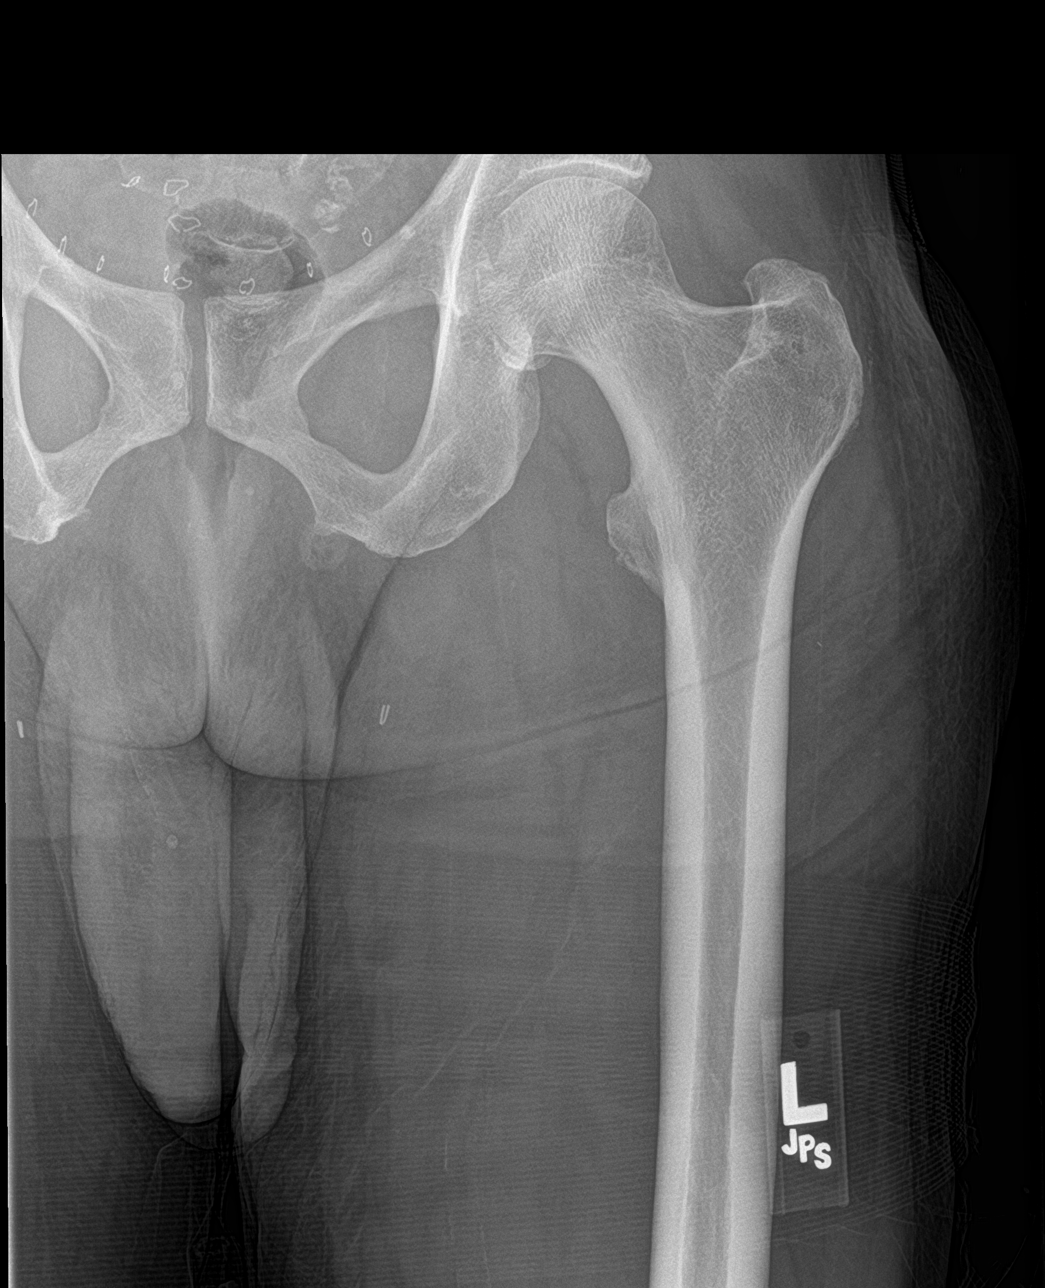

[3 of 3 positions shown; findings below may reference images not displayed]

FINDINGS: There is no evidence of hip fracture or dislocation. There is no
evidence of arthropathy or other focal bone abnormality.
IMPRESSION: No acute osseous injury of the left hip.

## 2019-02-26 ENCOUNTER — Telehealth (HOSPITAL_COMMUNITY): Payer: Self-pay

## 2019-02-26 NOTE — Telephone Encounter (Signed)
PA form faxed to [place] [facility] for continuance of medication.

## 2019-03-03 NOTE — Telephone Encounter (Signed)
Prior authorization was APPROVED for Chad [name] 97/103mg  and will expire on 02/21/20.  Approval #[id number]. Call reference [id number]

## 2019-03-17 ENCOUNTER — Encounter (HOSPITAL_COMMUNITY): Payer: Medicaid Other | Admitting: Internal Medicine

## 2019-03-17 DIAGNOSIS — I272 Pulmonary hypertension, unspecified: Secondary | ICD-10-CM | POA: Diagnosis not present

## 2019-03-17 DIAGNOSIS — I10 Essential (primary) hypertension: Secondary | ICD-10-CM | POA: Diagnosis not present

## 2019-03-17 DIAGNOSIS — T68XXXA Hypothermia, initial encounter: Secondary | ICD-10-CM | POA: Diagnosis not present

## 2019-03-17 DIAGNOSIS — R079 Chest pain, unspecified: Secondary | ICD-10-CM | POA: Diagnosis not present

## 2019-03-17 DIAGNOSIS — R0689 Other abnormalities of breathing: Secondary | ICD-10-CM | POA: Diagnosis not present

## 2019-03-17 DIAGNOSIS — I517 Cardiomegaly: Secondary | ICD-10-CM | POA: Diagnosis not present

## 2019-03-17 DIAGNOSIS — R10817 Generalized abdominal tenderness: Secondary | ICD-10-CM | POA: Diagnosis not present

## 2019-03-18 ENCOUNTER — Other Ambulatory Visit (HOSPITAL_COMMUNITY): Payer: Self-pay | Admitting: Internal Medicine

## 2019-03-19 ENCOUNTER — Other Ambulatory Visit: Payer: Self-pay | Admitting: Family Medicine

## 2019-03-19 DIAGNOSIS — M1A3711 Chronic gout due to renal impairment, right ankle and foot, with tophus (tophi): Secondary | ICD-10-CM

## 2019-03-20 DIAGNOSIS — M79662 Pain in left lower leg: Secondary | ICD-10-CM | POA: Diagnosis not present

## 2019-03-20 DIAGNOSIS — M542 Cervicalgia: Secondary | ICD-10-CM | POA: Diagnosis not present

## 2019-03-20 DIAGNOSIS — G894 Chronic pain syndrome: Secondary | ICD-10-CM | POA: Diagnosis not present

## 2019-03-20 DIAGNOSIS — M79661 Pain in right lower leg: Secondary | ICD-10-CM | POA: Diagnosis not present

## 2019-03-22 ENCOUNTER — Other Ambulatory Visit: Payer: Self-pay

## 2019-03-22 ENCOUNTER — Emergency Department (HOSPITAL_COMMUNITY): Payer: Medicaid Other

## 2019-03-22 ENCOUNTER — Emergency Department (HOSPITAL_COMMUNITY)
Admission: EM | Admit: 2019-03-22 | Discharge: 2019-03-22 | Disposition: A | Discharge status: Home / Self Care | Payer: Medicaid Other | Attending: Emergency Medicine | Admitting: Emergency Medicine

## 2019-03-22 ENCOUNTER — Encounter (HOSPITAL_COMMUNITY): Payer: Self-pay | Admitting: Emergency Medicine

## 2019-03-22 DIAGNOSIS — Z7901 Long term (current) use of anticoagulants: Secondary | ICD-10-CM | POA: Insufficient documentation

## 2019-03-22 DIAGNOSIS — N183 Chronic kidney disease, stage 3 (moderate): Secondary | ICD-10-CM | POA: Insufficient documentation

## 2019-03-22 DIAGNOSIS — I5022 Chronic systolic (congestive) heart failure: Secondary | ICD-10-CM | POA: Insufficient documentation

## 2019-03-22 DIAGNOSIS — Z7982 Long term (current) use of aspirin: Secondary | ICD-10-CM | POA: Diagnosis not present

## 2019-03-22 DIAGNOSIS — Z951 Presence of aortocoronary bypass graft: Secondary | ICD-10-CM | POA: Diagnosis not present

## 2019-03-22 DIAGNOSIS — M545 Low back pain, unspecified: Secondary | ICD-10-CM

## 2019-03-22 DIAGNOSIS — Z79899 Other long term (current) drug therapy: Secondary | ICD-10-CM | POA: Diagnosis not present

## 2019-03-22 DIAGNOSIS — N3000 Acute cystitis without hematuria: Secondary | ICD-10-CM

## 2019-03-22 DIAGNOSIS — I130 Hypertensive heart and chronic kidney disease with heart failure and stage 1 through stage 4 chronic kidney disease, or unspecified chronic kidney disease: Secondary | ICD-10-CM | POA: Insufficient documentation

## 2019-03-22 DIAGNOSIS — R109 Unspecified abdominal pain: Secondary | ICD-10-CM | POA: Diagnosis not present

## 2019-03-22 DIAGNOSIS — R1030 Lower abdominal pain, unspecified: Secondary | ICD-10-CM | POA: Diagnosis not present

## 2019-03-22 DIAGNOSIS — I2510 Atherosclerotic heart disease of native coronary artery without angina pectoris: Secondary | ICD-10-CM | POA: Insufficient documentation

## 2019-03-22 DIAGNOSIS — Z85850 Personal history of malignant neoplasm of thyroid: Secondary | ICD-10-CM | POA: Insufficient documentation

## 2019-03-22 DIAGNOSIS — J45909 Unspecified asthma, uncomplicated: Secondary | ICD-10-CM | POA: Diagnosis not present

## 2019-03-22 DIAGNOSIS — R309 Painful micturition, unspecified: Secondary | ICD-10-CM | POA: Diagnosis not present

## 2019-03-22 DIAGNOSIS — Z91040 Latex allergy status: Secondary | ICD-10-CM | POA: Diagnosis not present

## 2019-03-22 LAB — COMPREHENSIVE METABOLIC PANEL
ALK PHOS: 64 U/L (ref 38–126)
ALT: 11 U/L (ref 0–44)
AST: 14 U/L — ABNORMAL LOW (ref 15–41)
Albumin: 3.7 g/dL (ref 3.5–5.0)
Anion gap: 5 (ref 5–15)
BUN: 16 mg/dL (ref 6–20)
CALCIUM: 8.2 mg/dL — AB (ref 8.9–10.3)
CO2: 30 mmol/L (ref 22–32)
Chloride: 104 mmol/L (ref 98–111)
Creatinine, Ser: 1.38 mg/dL — ABNORMAL HIGH (ref 0.61–1.24)
GFR calc Af Amer: >60 mL/min (ref >60)
GFR calc non Af Amer: 57 mL/min — ABNORMAL LOW (ref >60)
Glucose, Bld: 117 mg/dL — ABNORMAL HIGH (ref 70–99)
Potassium: 3.6 mmol/L (ref 3.5–5.1)
Sodium: 139 mmol/L (ref 135–145)
TOTAL PROTEIN: 6.6 g/dL (ref 6.5–8.1)
Total Bilirubin: 0.7 mg/dL (ref 0.3–1.2)

## 2019-03-22 LAB — URINALYSIS, ROUTINE W REFLEX MICROSCOPIC
Bacteria, UA: NONE SEEN
Bilirubin Urine: NEGATIVE
Glucose, UA: NEGATIVE mg/dL
Hgb urine dipstick: NEGATIVE
Ketones, ur: NEGATIVE mg/dL
Nitrite: NEGATIVE
PH: 5 (ref 5.0–8.0)
Protein, ur: NEGATIVE mg/dL
Specific Gravity, Urine: 1.014 (ref 1.005–1.030)

## 2019-03-22 LAB — CBC
HCT: 38.3 % — ABNORMAL LOW (ref 39.0–52.0)
HEMOGLOBIN: 11.9 g/dL — AB (ref 13.0–17.0)
MCH: 26 pg (ref 26.0–34.0)
MCHC: 31.1 g/dL (ref 30.0–36.0)
MCV: 83.6 fL (ref 80.0–100.0)
Platelets: 165 10*3/uL (ref 150–400)
RBC: 4.58 MIL/uL (ref 4.22–5.81)
RDW: 15.4 % (ref 11.5–15.5)
WBC: 4.2 10*3/uL (ref 4.0–10.5)
nRBC: 0 % (ref 0.0–0.2)

## 2019-03-22 MED ORDER — CEPHALEXIN 500 MG PO CAPS: 500.0000 mg | ORAL_CAPSULE | Freq: Four times a day (QID) | ORAL | 0 refills | Status: DC

## 2019-03-22 MED ORDER — SODIUM CHLORIDE 0.9 % IV SOLN
INTRAVENOUS | Status: DC
  Administered 2019-03-22: 20:00:00 via INTRAVENOUS

## 2019-03-22 MED ORDER — SODIUM CHLORIDE 0.9 % IV BOLUS
1000.0000 mL | Freq: Once | INTRAVENOUS | Status: AC
  Administered 2019-03-22: 1000 mL via INTRAVENOUS

## 2019-03-22 MED ORDER — DIPHENHYDRAMINE HCL 50 MG/ML IJ SOLN
25.0000 mg | Freq: Once | INTRAMUSCULAR | Status: AC
  Administered 2019-03-22: 25 mg via INTRAVENOUS
  Filled 2019-03-22: qty 1

## 2019-03-22 MED ORDER — MORPHINE SULFATE (PF) 4 MG/ML IV SOLN
4.0000 mg | Freq: Once | INTRAVENOUS | Status: AC
  Administered 2019-03-22: 4 mg via INTRAVENOUS
  Filled 2019-03-22: qty 1

## 2019-03-22 MED ORDER — SODIUM CHLORIDE 0.9 % IV SOLN
1.0000 g | Freq: Once | INTRAVENOUS | Status: AC
  Administered 2019-03-22: 1 g via INTRAVENOUS
  Filled 2019-03-22: qty 10

## 2019-03-22 MED ORDER — DIAZEPAM 5 MG PO TABS: 5.0000 mg | ORAL_TABLET | Freq: Two times a day (BID) | ORAL | 0 refills | Status: DC

## 2019-03-22 MED ORDER — ONDANSETRON HCL 4 MG/2ML IJ SOLN
4.0000 mg | Freq: Once | INTRAMUSCULAR | Status: AC
  Administered 2019-03-22: 4 mg via INTRAVENOUS
  Filled 2019-03-22: qty 2

## 2019-03-22 NOTE — ED Provider Notes (Signed)
[facility] EMERGENCY DEPARTMENT Provider Note   CSN: [id number] Arrival date & time: 03/22/19  1704    History   Chief Complaint Chief Complaint  Patient presents with  . Flank Pain    HPI Chad [name]. is a 57 y.o. male.     Pt presents to the ED today with flank pain.  He said the pain is more on the left, but it is on both sides.  He said it's been going on for 4 days.  He also c/o itching to his groin.  Denies urinary sx.  Pt is sexually active with 1 partner.  He went to [facility] ED on 3/23 for CP and MI r/o.  Pt denies cp now.  No n/v.  No abd pain.     Past Medical History:  Diagnosis Date  . AICD (automatic cardioverter/defibrillator) present   . Anemia   . Anginal pain ([place])   . Anxiety   . Aortic dissection, thoracoabdominal ([place])    7/10: Type I s/p repair  . Arthritis   . Asthma   . CAD (coronary artery disease)    a. s/p CABG 2006;  b. DES to PDA 2011 (cath: Dx not seen, dRCA/PDA tx with DES; S-PDA occluded (culprit), S-Dx occluded, S-RI and OM ok, L-LAD ok  . Carotid stenosis    dopplers 2011: 0-39% bilat.  . Chest pain syndrome   . CHF (congestive heart failure) ([place])   . Chronic bronchitis ([place])   . Chronic lower back pain   . Chronic systolic heart failure (HCC)    a. 12/13 ECHO: EF 35-40%, sept, apical & posterobasal HK, LV mod dil & sys fx mod reduced, mild AI, MV mild reg, TV mild reg  . Complication of anesthesia    "difficult to wake afterwards a couple of times" and patient states he has woken during surgery   . 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 Dyspnea    patient denies at 12/06/2018 appt   . Family history of adverse reaction to anesthesia    "sister hard to wake up"  . GERD (gastroesophageal reflux disease)   . Gout   . Heart murmur   . HLD (hyperlipidemia)   . HTN (hypertension)    severe  . Myocardial infarction [facility])    "many" (11/07/2016)  .  PONV (postoperative nausea and vomiting)   . Sleep apnea    does not use mask   . Thyroid cancer ([place])    [employer]    Patient Active Problem List   Diagnosis Date Noted  . S/P laparoscopic cholecystectomy 12/09/2018  . Colicky RUQ abdominal pain 11/30/2018  . Pain in joint of left shoulder 07/05/2018  . 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 Hip region mass, left 06/24/2018  . Acute idiopathic gout of right foot 05/22/2018  . Inguinal hernia 02/18/2018  . Prediabetes 12/20/2017  . Renal insufficiency   . Hypokalemia   . CHF exacerbation ([place]) 12/15/2017  . Failure of implantable cardioverter-defibrillator (ICD) lead 11/07/2016  . Dyspnea 05/12/2016  . Erectile dysfunction 04/18/2016  . Pain in the chest   . Aneurysm of descending thoracic aorta ([place])   . Nasal folliculitis 35/57/3220  . Chronic systolic CHF (congestive heart failure) ([place]) 04/12/2015  . CKD (chronic kidney disease) stage 3, GFR 30-59 ml/min (HCC) 04/12/2015  . Unstable angina ([place]) 01/25/2015  .  Chest pain 10/10/2014  . Health care maintenance 03/19/2014  . Local reaction to tetanus vaccine 03/19/2014  . Neck pain on left side 06/03/2013  . Penile discharge 02/26/2013  . Syncope 01/17/2013  . Chronic combined systolic and diastolic CHF (congestive heart failure) ([place]) 01/16/2013  . Low back pain 04/23/2012  . Chest pain syndrome   . Insomnia 09/14/2011  . 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 Coronary atherosclerosis of native coronary artery 06/12/2011  . Bradycardia 05/03/2011  . Aortic dissection, thoracoabdominal ([place])   . Chronic kidney disease (CKD), stage II (mild)   . ORTHOSTATIC HYPOTENSION 05/03/2010  . CAROTID BRUIT 01/25/2010  . PANCYTOPENIA 11/01/2009  . Fatigue 10/25/2009  . DISSECTING AORTIC ANEURYSM THORACOABDOMINAL 07/14/2009  . SCIATICA, RIGHT 05/18/2009  . HYPERLIPIDEMIA-MIXED 09/29/2008  .  HYPERTENSION, BENIGN 09/29/2008  . CAD, ARTERY BYPASS GRAFT 09/29/2008  . SYSTOLIC HEART FAILURE, CHRONIC 09/29/2008  . ICD - IN SITU 09/29/2008    Past Surgical History:  Procedure Laterality Date  . BACK SURGERY    . CARDIAC CATHETERIZATION     "several" (11/07/2016)  . CARDIAC DEFIBRILLATOR PLACEMENT  11/2005   [employer]; Archie Endo 05/09/2011  . CHOLECYSTECTOMY N/A 12/09/2018   Procedure: LAPAROSCOPIC CHOLECYSTECTOMY;  Surgeon: Ralene Ok, MD;  Location: [facility];  Service: General;  Laterality: N/A;  . CORONARY ANGIOPLASTY WITH STENT PLACEMENT     "I've had 1-2 stents" (11/07/2016)  . CORONARY ARTERY BYPASS GRAFT  06/21/2009   "CABG X2"  . CORONARY ARTERY BYPASS GRAFT  06/29/2005   "CABG X7"  . ICD LEAD REMOVAL  11/07/2016  . ICD LEAD REMOVAL N/A 11/07/2016   Procedure: ICD LEAD REMOVAL, INSERTION OF NEW ICD LEAD;  Surgeon: Evans Lance, MD;  Location: [facility];  Service: Cardiovascular;  Laterality: N/A;  Dr. Prescott Gum to backup case  . IMPLANTABLE CARDIOVERTER DEFIBRILLATOR (ICD) GENERATOR CHANGE N/A 12/13/2012   Procedure: ICD GENERATOR CHANGE;  Surgeon: Evans Lance, MD;  Location: [facility] CATH LAB;  Service: Cardiovascular;  Laterality: N/A;  . INGUINAL HERNIA REPAIR Bilateral 06/25/2018   Procedure: LAPAROSCOPIC  BILATERAL INGUINAL HERNIA REPAIRS;  Surgeon: Ralene Ok, MD;  Location: [facility];  Service: General;  Laterality: Bilateral;  . INSERTION OF MESH Bilateral 06/25/2018   Procedure: INSERTION OF MESH;  Surgeon: Ralene Ok, MD;  Location: [facility];  Service: General;  Laterality: Bilateral;  . [place] SURGERY  01/2001    most recent within 5-10 years  . RIGHT HEART CATH N/A 04/05/2018   Procedure: RIGHT HEART CATH;  Surgeon: Jolaine Artist, MD;  Location: [facility] CV LAB;  Service: Cardiovascular;  Laterality: N/A;  . SHOULDER ARTHROSCOPY WITH ROTATOR CUFF REPAIR Bilateral   . Status post emergency repair of a type A ascending aortic dissection  with a hemiarch reconstruction of the ascending aorta  using a 28-mm Hemashield graft with redo sternotomy and revision of previous bypass grafts in June 2010.    . TESTICLE SURGERY    . THYROIDECTOMY, PARTIAL  06/20/2011  . TONSILLECTOMY    . UMBILICAL HERNIA REPAIR N/A 06/25/2018   Procedure: UMBILICAL HERNIA REPAIR;  Surgeon: Ralene Ok, MD;  Location: [facility];  Service: General;  Laterality: N/A;  . VASECTOMY          Home Medications    Prior to Admission medications   Medication Sig Start Date End Date Taking? Authorizing Provider  albuterol (PROVENTIL HFA;VENTOLIN HFA) 108 (90 Base) MCG/ACT inhaler Inhale 2 puffs into the lungs every  6 (six) hours as needed for wheezing or shortness of breath. 10/19/17   Tonette Bihari, MD  aspirin EC 81 MG tablet Take 1 tablet (81 mg total) by mouth daily. Can take 4 tabs (324 mg total) as needed for chest pain. DO NOT TAKE multiple 325 mg tabs. 05/01/18   Shirley Friar, PA-C  B Complex-C (SUPER B COMPLEX PO) Take 3 tablets by mouth daily.     [provider]  carvedilol (COREG) 25 MG tablet TAKE 1 TABLET BY MOUTH TWICE DAILY WITH  A  MEAL Patient taking differently: Take 25 mg by mouth 2 (two) times daily with a meal.  10/10/18   Bensimhon, Shaune Pascal, MD  cephALEXin (KEFLEX) 500 MG capsule Take 1 capsule (500 mg total) by mouth 4 (four) times daily. 03/22/19   Isla Pence, MD  clopidogrel (PLAVIX) 75 MG tablet Take 1 tablet (75 mg total) by mouth daily. 10/10/18   Bensimhon, Shaune Pascal, MD  diazepam (VALIUM) 5 MG tablet Take 1 tablet (5 mg total) by mouth 2 (two) times daily. 03/22/19   Isla Pence, MD  doxazosin (CARDURA) 1 MG tablet Take 1 tablet (1 mg total) by mouth every 12 (twelve) hours as needed (for systolic BP > 542 mm Hg.). 10/10/18   Bensimhon, Shaune Pascal, MD  fluticasone (FLONASE) 50 MCG/ACT nasal spray Place 2 sprays into both nostrils daily as needed for allergies or rhinitis.     [provider]   furosemide (LASIX) 80 MG tablet Take 1 tablet (80 mg total) by mouth 2 (two) times daily. 10/10/18   Bensimhon, Shaune Pascal, MD  hydrALAZINE (APRESOLINE) 50 MG tablet Take 1.5 tablets (75 mg total) by mouth 2 (two) times daily. 10/10/18   Bensimhon, Shaune Pascal, MD  ipratropium-albuterol (DUONEB) 0.5-2.5 (3) MG/3ML SOLN Take 3 mLs by nebulization every 6 (six) hours as needed. Patient taking differently: Take 3 mLs by nebulization every 6 (six) hours as needed (SOB, wheezing).  12/20/17   Lovenia Kim, MD  isosorbide mononitrate (IMDUR) 60 MG 24 hr tablet Take 1 tablet (60 mg total) by mouth 2 (two) times daily. 10/10/18   Bensimhon, Shaune Pascal, MD  MITIGARE 0.6 MG CAPS Take 0.12 mg by mouth daily as needed (gout attack). 03/20/19   Nuala Alpha, DO  nitroGLYCERIN (NITROSTAT) 0.4 MG SL tablet PLACE 1 TABLET UNDER THE TONGUE EVERY 5 MINUTES FOR 3 DOSES AS NEEDED FOR CHEST PAIN Patient taking differently: Place 0.4 mg under the tongue every 5 (five) minutes as needed for chest pain.  09/16/18   Bensimhon, Shaune Pascal, MD  Oxycodone HCl 10 MG TABS Take 20 mg by mouth every 8 (eight) hours.     [provider]  potassium chloride (KLOR-CON M10) 10 MEQ tablet Take 1 tablet (10 mEq total) by mouth daily. 10/10/18   Bensimhon, Shaune Pascal, MD  sacubitril-valsartan (ENTRESTO) 97-103 MG Take 1 tablet by mouth 2 (two) times daily. 10/10/18   Bensimhon, Shaune Pascal, MD  sildenafil (VIAGRA) 100 MG tablet TAKE 1/2 (ONE-HALF) TABLET BY MOUTH ONCE DAILY AS NEEDED FOR ERECTILE DYSFUNCTION 03/19/19   Bensimhon, Shaune Pascal, MD  spironolactone (ALDACTONE) 25 MG tablet Take 0.5 tablets (12.5 mg total) by mouth daily. 10/10/18   Bensimhon, Shaune Pascal, MD  ursodiol (ACTIGALL) 500 MG tablet Take 1 tablet by mouth twice daily 03/20/19   Nuala Alpha, DO    Family History Family History  Problem Relation Age of Onset  . Hypertension Father   . Heart disease Father   .  Early death Father   . COPD Father   . Hypertension  Mother   . Coronary artery disease Other     Social History Social History   Tobacco Use  . Smoking status: Never Smoker  . Smokeless tobacco: Never Used  Substance Use Topics  . Alcohol use: No  . Drug use: No     Allergies   Contrast media [iodinated diagnostic agents]; Iohexol; Lipitor [atorvastatin calcium]; Shellfish allergy; Sulfa antibiotics; Sulfonamide derivatives; Almond oil; Metrizamide; Zocor [simvastatin]; Latex; and Peach [prunus persica]   Review of Systems Review of Systems  Genitourinary: Positive for flank pain.  All other systems reviewed and are negative.    Physical Exam Updated Vital Signs BP (!) 112/94   Pulse 73   Temp 98.8 F (37.1 C) (Oral)   Resp 18   Ht 5\' 11"  (1.803 m)   Wt 72.6 kg   SpO2 98%   BMI 22.32 kg/m   Physical Exam Vitals signs and nursing note reviewed.  Constitutional:      Appearance: Normal appearance.  HENT:     Head: Normocephalic and atraumatic.     Right Ear: External ear normal.     Left Ear: External ear normal.     Nose: Nose normal.     Mouth/Throat:     Mouth: Mucous membranes are moist.  Eyes:     Extraocular Movements: Extraocular movements intact.     Conjunctiva/sclera: Conjunctivae normal.     Pupils: Pupils are equal, round, and reactive to light.  Neck:     Musculoskeletal: Normal range of motion.  Cardiovascular:     Rate and Rhythm: Normal rate and regular rhythm.     Pulses: Normal pulses.     Heart sounds: Normal heart sounds.  Pulmonary:     Effort: Pulmonary effort is normal.     Breath sounds: Normal breath sounds.  Abdominal:     General: Abdomen is flat.  Genitourinary:    Penis: Normal.      Scrotum/Testes: Normal.  Musculoskeletal: Normal range of motion.       Arms:  Skin:    General: Skin is warm.     Capillary Refill: Capillary refill takes less than 2 seconds.  Neurological:     General: No focal deficit present.     Mental Status: He is alert and oriented to person,  place, and time.  Psychiatric:        Mood and Affect: Mood normal.        Behavior: Behavior normal.      ED Treatments / Results  Labs (all labs ordered are listed, but only abnormal results are displayed) Labs Reviewed  CBC - Abnormal; Notable for the following components:      Result Value   Hemoglobin 11.9 (*)    HCT 38.3 (*)    All other components within normal limits  URINALYSIS, ROUTINE W REFLEX MICROSCOPIC - Abnormal; Notable for the following components:   Leukocytes,Ua LARGE (*)    All other components within normal limits  COMPREHENSIVE METABOLIC PANEL - Abnormal; Notable for the following components:   Glucose, Bld 117 (*)    Creatinine, Ser 1.38 (*)    Calcium 8.2 (*)    AST 14 (*)    GFR calc non Af Amer 57 (*)    All other components within normal limits  URINE CULTURE  GC/CHLAMYDIA PROBE AMP ([place]) NOT AT [facility]    EKG EKG Interpretation  Date/Time:  Saturday March 22 2019  17:18:05 EDT Ventricular Rate:  79 PR Interval:    QRS Duration: 121 QT Interval:  405 QTC Calculation: 465 R Axis:   -15 Text Interpretation:  Sinus rhythm Probable left atrial enlargement Left bundle branch block No significant change since last tracing Confirmed by Isla Pence [phone]) on 03/22/2019 6:17:57 PM   Radiology Ct Renal Stone Study  Result Date: 03/22/2019 CLINICAL DATA:  Flank pain for 4 days. Urinary frequency and painful urination. EXAM: CT ABDOMEN AND PELVIS WITHOUT CONTRAST TECHNIQUE: Multidetector CT imaging of the abdomen and pelvis was performed following the standard protocol without IV contrast. COMPARISON:  Abdominal CT 06/01/2017. Left hip CT 12/20/2018. CTA 12/05/2015 FINDINGS: Lower chest: Multi chamber cardiomegaly. Pacemaker partially included. Coronary artery calcifications. Subsegmental atelectasis in the right middle lobe. Mild compressive atelectasis adjacent to the descending aorta. There are bilateral fat containing Bochdalek hernias.  Hepatobiliary: Mild motion artifact. No evidence of focal hepatic abnormality. Gallbladder not visualized, post cholecystectomy. No evidence of biliary dilatation. Pancreas: Limited evaluation due to motion artifact and noncontrast exam. No evidence of ductal dilatation or peripancreatic inflammation. Spleen: Normal in size without focal abnormality. Adrenals/Urinary Tract: No adrenal nodule. Marked chronic left renal atrophy. Compensatory hypertrophy of the right kidney. No hydronephrosis of either kidney. Mild right perinephric edema. No renal or ureteral calculi. Urinary bladder is physiologically distended with mild bladder wall thickening, similar to prior. No perivesicular stranding. Stomach/Bowel: Bowel evaluation limited in the absence of contrast. Mild motion artifact through the upper abdomen further limits evaluation. Stomach is within normal limits. Appendix appears normal. No evidence of bowel wall thickening, distention, or inflammatory changes. Distal colonic diverticulosis without diverticulitis. Vascular/Lymphatic: Known aortic dissection, not well characterized in the absence of IV contrast. Unchanged aneurysmal dilatation of the infrarenal aorta at 3.3 cm. There is no periaortic stranding. Dilatation of bilateral common iliac arteries measuring 2.1 cm on the left and 1.7 cm on the right, similar allowing for differences in caliper placement. No evidence of adenopathy. Reproductive: Mild prostate enlargement. Other: No free air or free fluid. Postsurgical change of the lower anterior abdominal wall. No abdominal wall hernia. Mild body wall edema. Musculoskeletal: Oblong low-density lesion within the left gluteus maximus muscle, unchanged from recent CT, present since 2015 CTA and increased in size since more remote exam. Degenerative disc disease in the lower lumbar spine. IMPRESSION: 1. Mild right perinephric edema, nonspecific, can be seen with urinary tract infection or chronic renal disease. No  urolithiasis or obstructive uropathy. 2. Chronic left renal atrophy and compensatory hypertrophy of the right kidney. 3. Distal colonic diverticulosis without diverticulitis. 4. Chronic aortic dissection, unchanged by noncontrast technique with unchanged infrarenal aortic aneurysm, 3.3 cm. 5. Low-density lesion in the left gluteal muscle, unchanged from recent hip CT. This is been present dating back to 2015, but has increased in size, indeterminate. Electronically Signed   By: Keith Rake M.D.   On: 03/22/2019 19:21    Procedures Procedures (including critical care time)  Medications Ordered in ED Medications  sodium chloride 0.9 % bolus 1,000 mL (1,000 mLs Intravenous New Bag/Given 03/22/19 1744)    And  0.9 %  sodium chloride infusion (has no administration in time range)  cefTRIAXone (ROCEPHIN) 1 g in sodium chloride 0.9 % 100 mL IVPB (has no administration in time range)  morphine 4 MG/ML injection 4 mg (4 mg Intravenous Given 03/22/19 1739)  ondansetron (ZOFRAN) injection 4 mg (4 mg Intravenous Given 03/22/19 1733)  diphenhydrAMINE (BENADRYL) injection 25 mg (25 mg Intravenous Given 03/22/19  1754)     Initial Impression / Assessment and Plan / ED Course  I have reviewed the triage vital signs and the nursing notes.  Pertinent labs & imaging results that were available during my care of the patient were reviewed by me and considered in my medical decision making (see chart for details).     Pt does have a UTI with some mild right perinephric edema.  Maybe early pyelo?  Pt's urine sent for cx, but he will be treated with rocephin in ED and d/c home with keflex.  GC swab pending.  He has a monogamous relationship, so he does not want to be treated.  I told him we'd call him if +.  He knows to return if worse and to f/u with pcp.  Pt got a 120 of oxycodone 20 on 2/28, but no benzos.  He will be given a rx for 10 valium 5 mg pills.   He is instructed to return if worse.  Final Clinical  Impressions(s) / ED Diagnoses   Final diagnoses:  Acute cystitis without hematuria  Acute bilateral low back pain without sciatica    ED Discharge Orders         Ordered    cephALEXin (KEFLEX) 500 MG capsule  4 times daily     03/22/19 1933    diazepam (VALIUM) 5 MG tablet  2 times daily     03/22/19 1933           Isla Pence, MD 03/22/19 1934

## 2019-03-22 NOTE — ED Triage Notes (Signed)
Patient has had flank pain for about four days. He also complains of itching in his groin area. He denies increased frequency of urination and pain while urinating.

## 2019-03-23 LAB — URINE CULTURE: Culture: <10000 — AB

## 2019-03-24 LAB — GC/CHLAMYDIA PROBE AMP (~~LOC~~) NOT AT ARMC
CHLAMYDIA, DNA PROBE: POSITIVE — AB
Neisseria Gonorrhea: NEGATIVE

## 2019-03-25 ENCOUNTER — Other Ambulatory Visit: Payer: Self-pay

## 2019-03-25 ENCOUNTER — Telehealth: Payer: Medicaid Other

## 2019-03-25 ENCOUNTER — Telehealth: Payer: Self-pay | Admitting: Family Medicine

## 2019-03-25 NOTE — Telephone Encounter (Signed)
Call patient as he was scheduled for a telemedicine encounter today.  Patient states that he does not have any concerns at this time.  He states that he had originally scheduled the appointment because he wanted to talk to someone about getting follow-up for his hip mass, but he was contacted by [facility] after scheduling the appointment and is now scheduled for work-up of this.  He has no other concerns or questions.

## 2019-03-26 ENCOUNTER — Telehealth: Payer: Self-pay | Admitting: Medical

## 2019-03-26 DIAGNOSIS — A749 Chlamydial infection, unspecified: Secondary | ICD-10-CM

## 2019-03-26 MED ORDER — AZITHROMYCIN 250 MG PO TABS: 1000.0000 mg | ORAL_TABLET | Freq: Once | ORAL | 0 refills | Status: AC

## 2019-03-26 NOTE — Telephone Encounter (Addendum)
Chad [name]. tested positive for  Chlamydia. Patient was called by RN and allergies and pharmacy confirmed. Rx sent to pharmacy of choice.   Luvenia Redden, PA-C 03/26/2019 11:53 AM      ----- Message from Bjorn Loser, RN sent at 03/26/2019 10:47 AM EDT ----- This patient tested positive for :  Chlamydia He "is allergic to Lipitor, Sulfa, Zocor, Metrizamide, latex" I have informed the patient of his results and confirmed his pharmacy is correct in his chart. Please send Rx.   Thank you,   Bjorn Loser, RN   Results faxed to [facility] Department.

## 2019-04-15 IMAGING — DX DG CHEST 2V
2 series · 2 of 2 positions shown · non-contrast
Comparison: Chest radiograph April 30, 2018

CLINICAL DATA: Back pain. History of aortic aneurysm and CHF. A
cough for

EXAM:
CHEST - 2 VIEW

[chest pa]
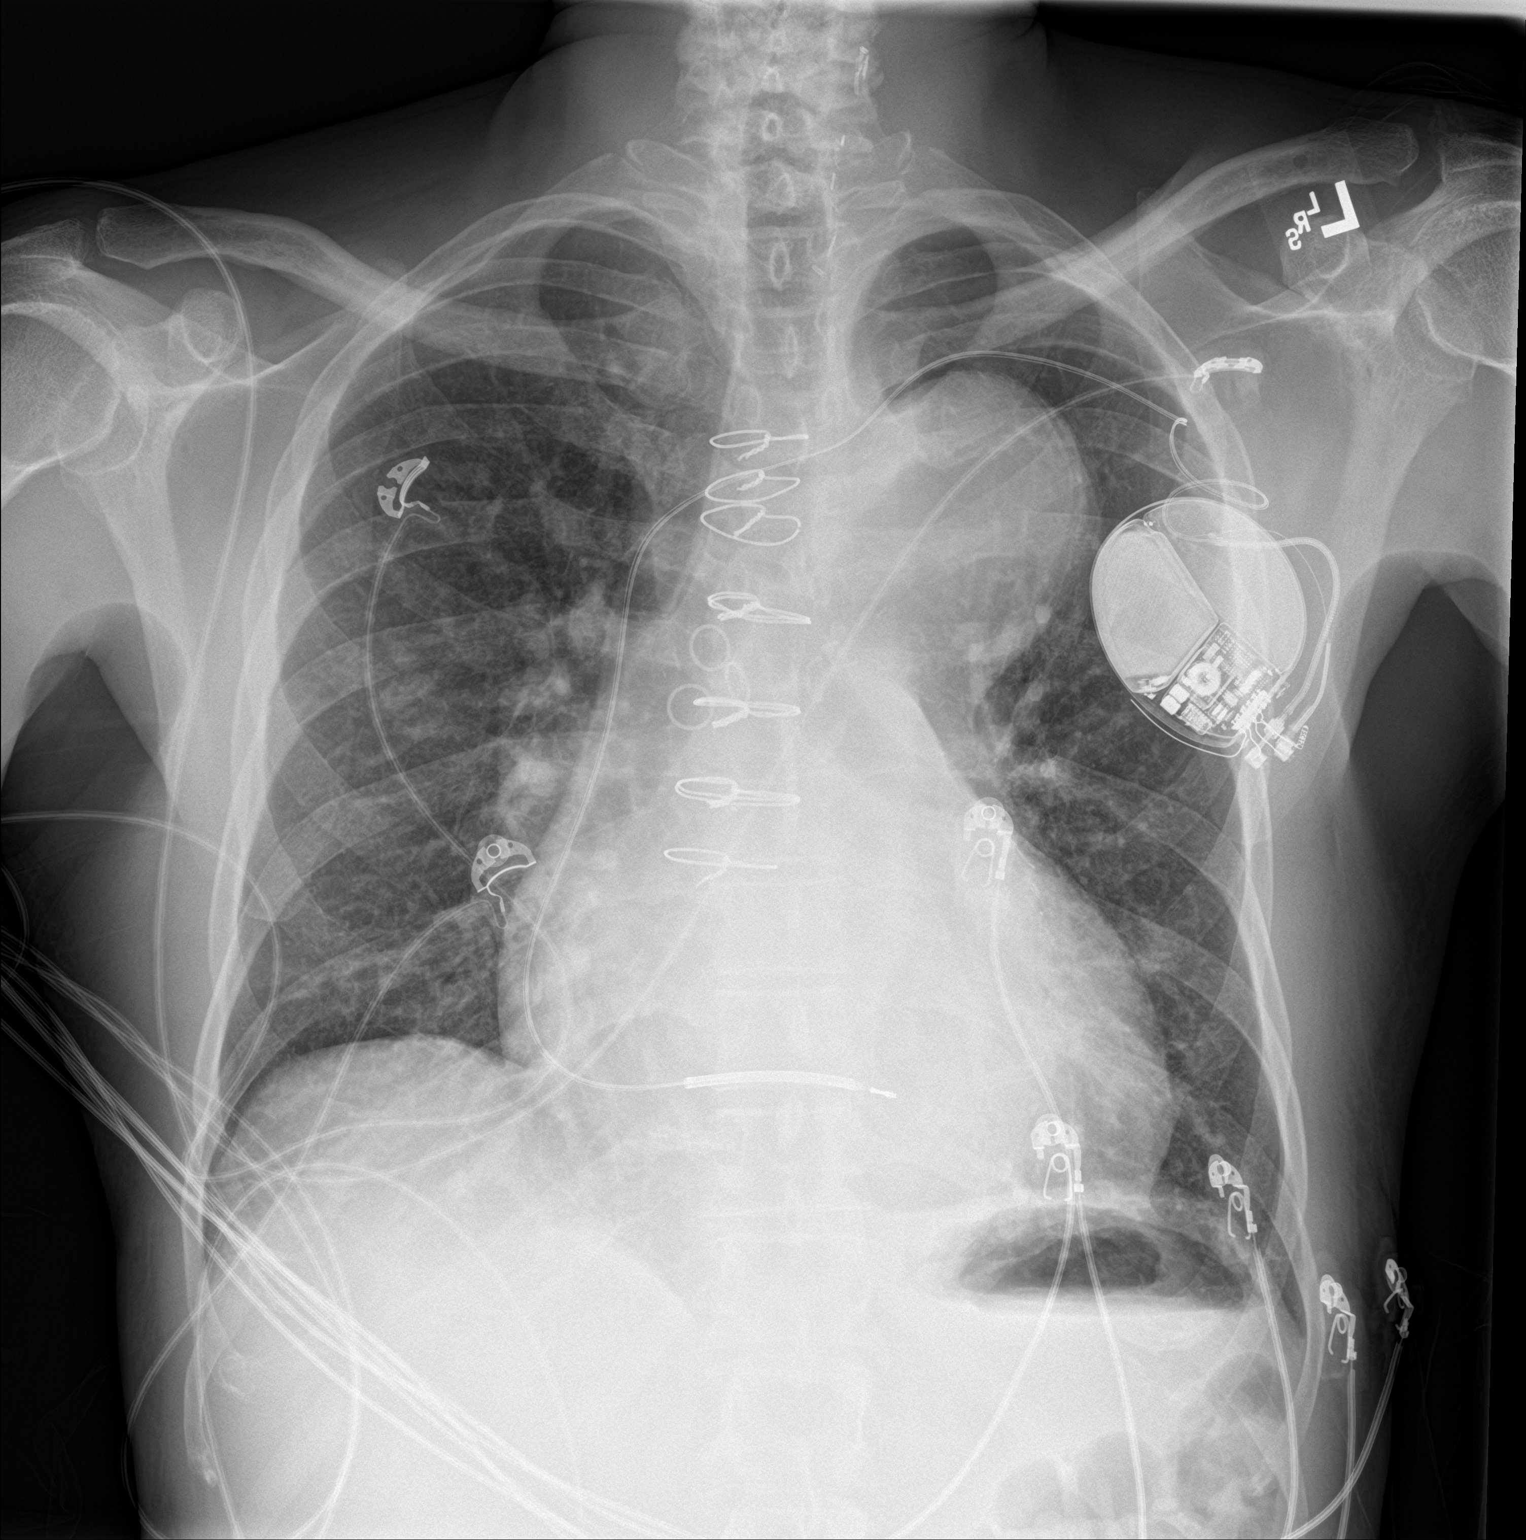

[chest lat]
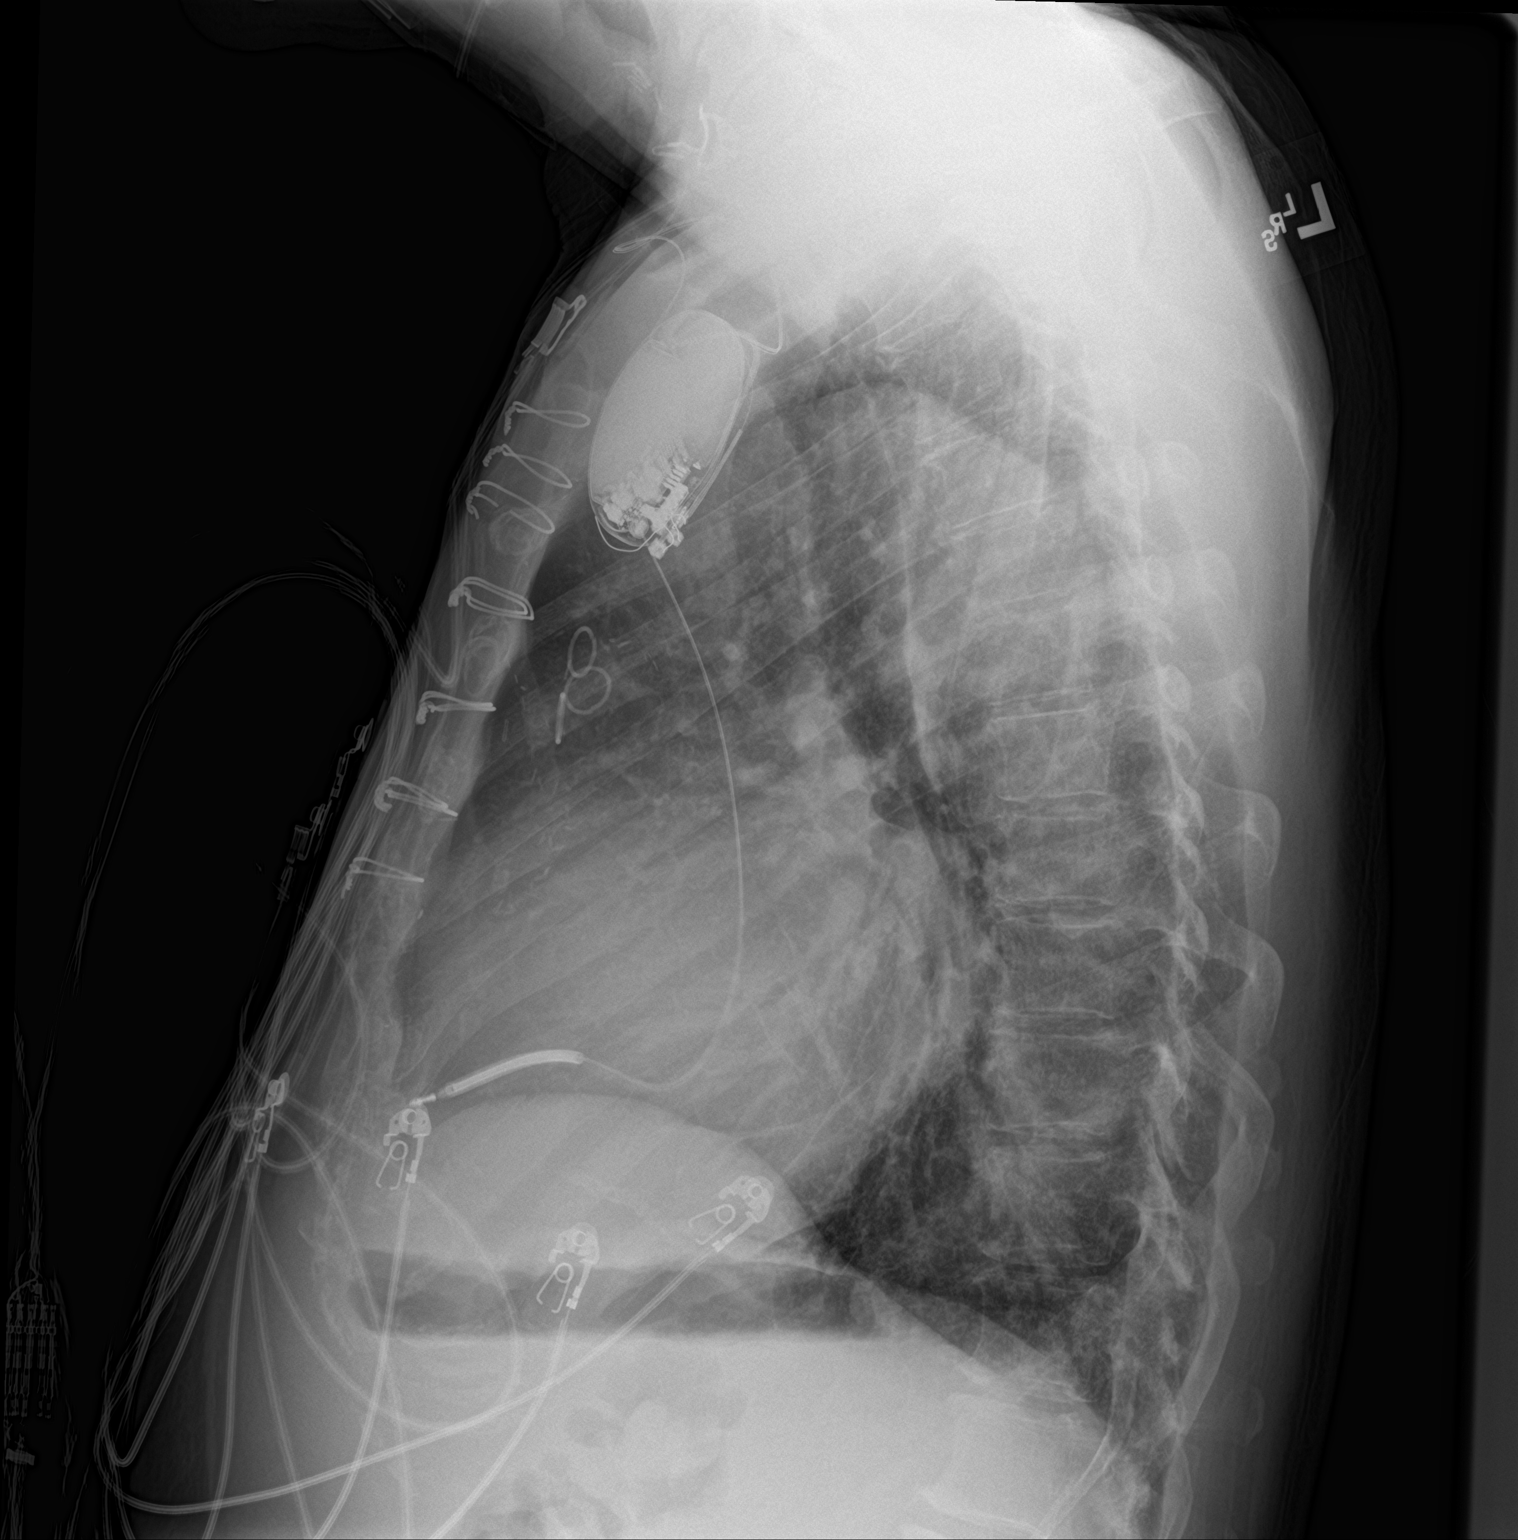

[2 of 2 positions shown; findings below may reference images not displayed]

FINDINGS: Stable cardiomegaly. Aneurysmal aorta. Status post status post
median sternotomy for CABG. No pleural effusion or focal
consolidation. No pneumothorax. LEFT single lead AICD in situ.
Surgical clips LEFT neck.
IMPRESSION: 1. Stable cardiomegaly.  No acute pulmonary or aneurysm.
2. Stable radiographic appearance of thoracic aortic aneurysm.

## 2019-04-16 IMAGING — DX DG CHEST 2V
2 series · 2 of 2 positions shown · non-contrast
Comparison: Multiple chest x-rays since April 20, 2018

CLINICAL DATA: Chest pain.

EXAM:
CHEST - 2 VIEW

[w chest pa]
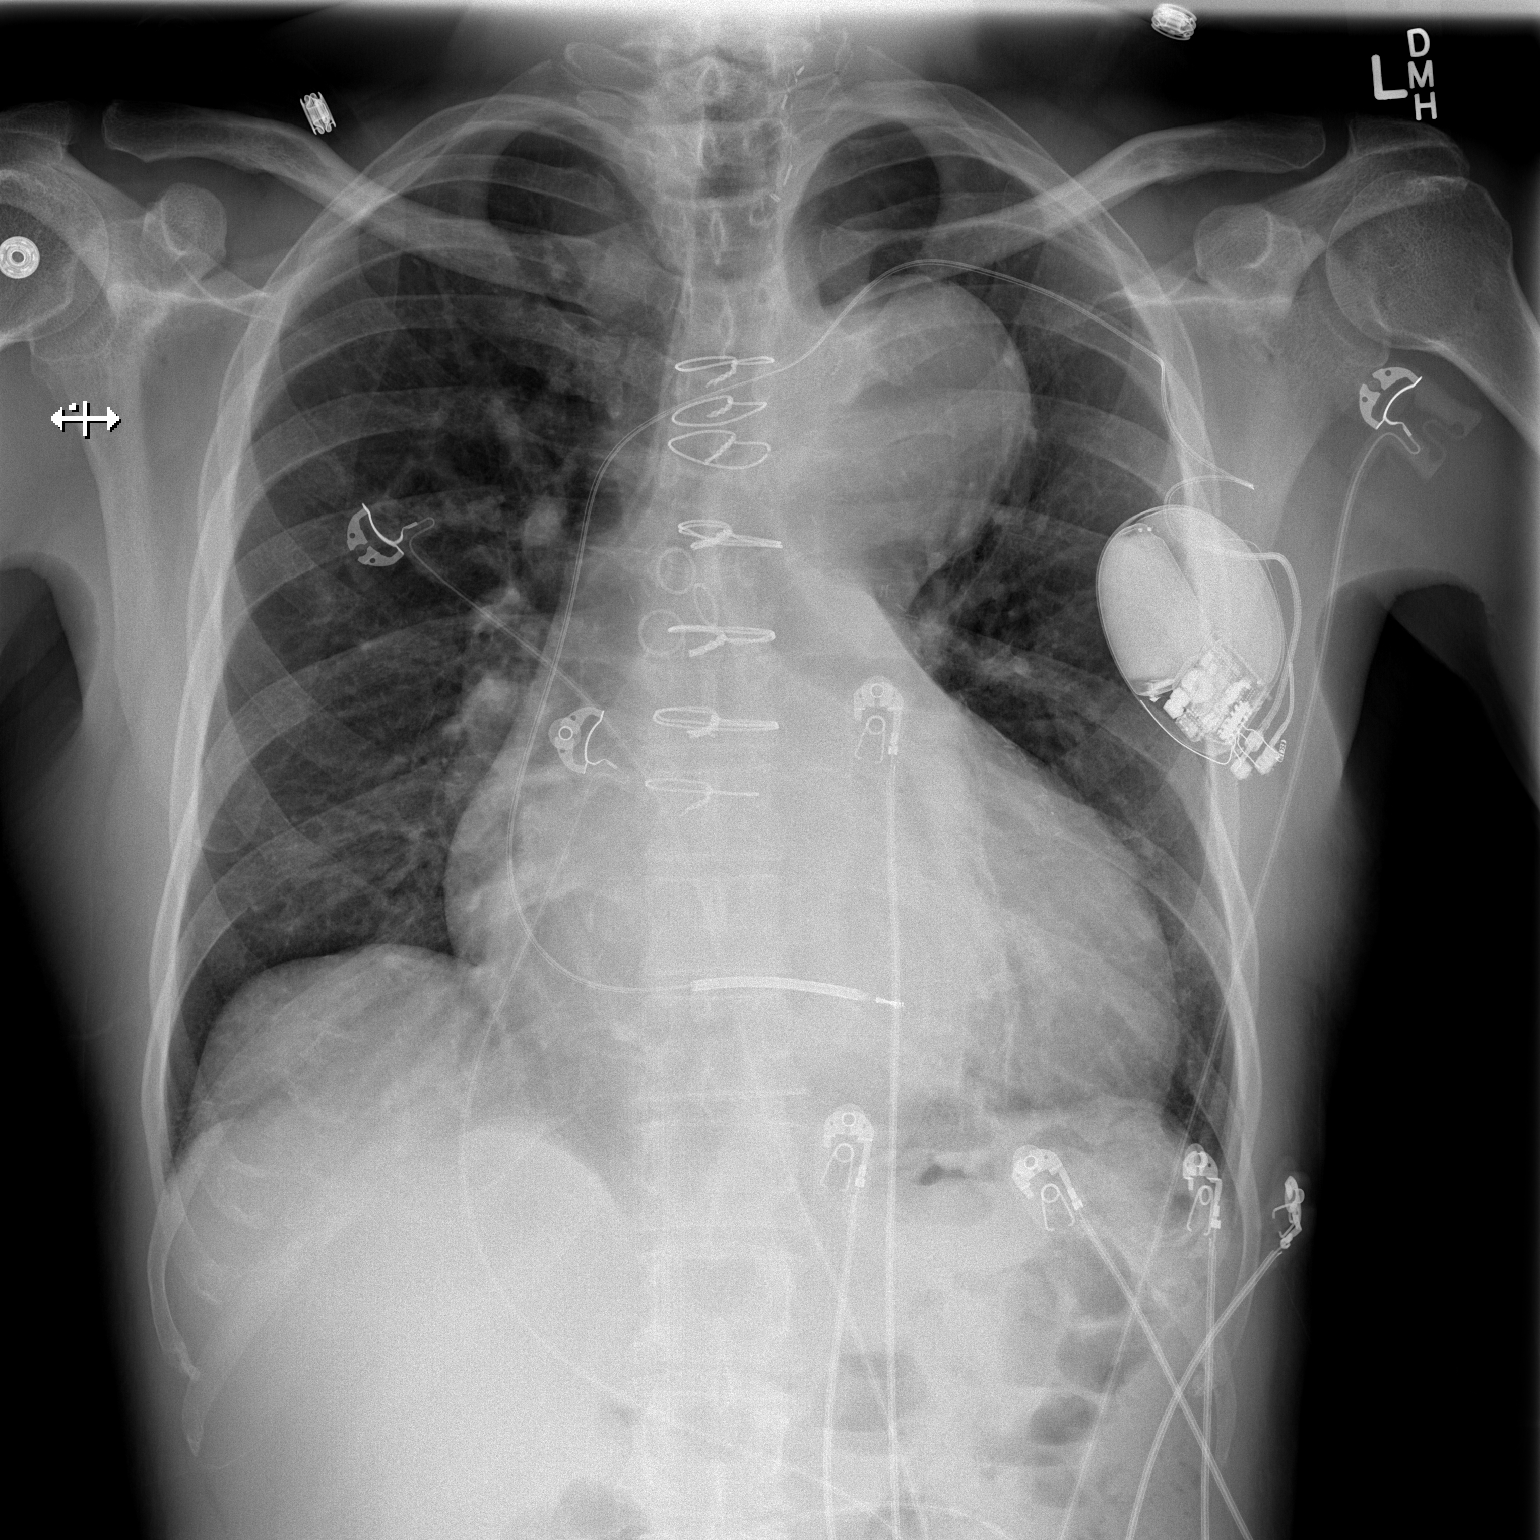

[w chest lat]
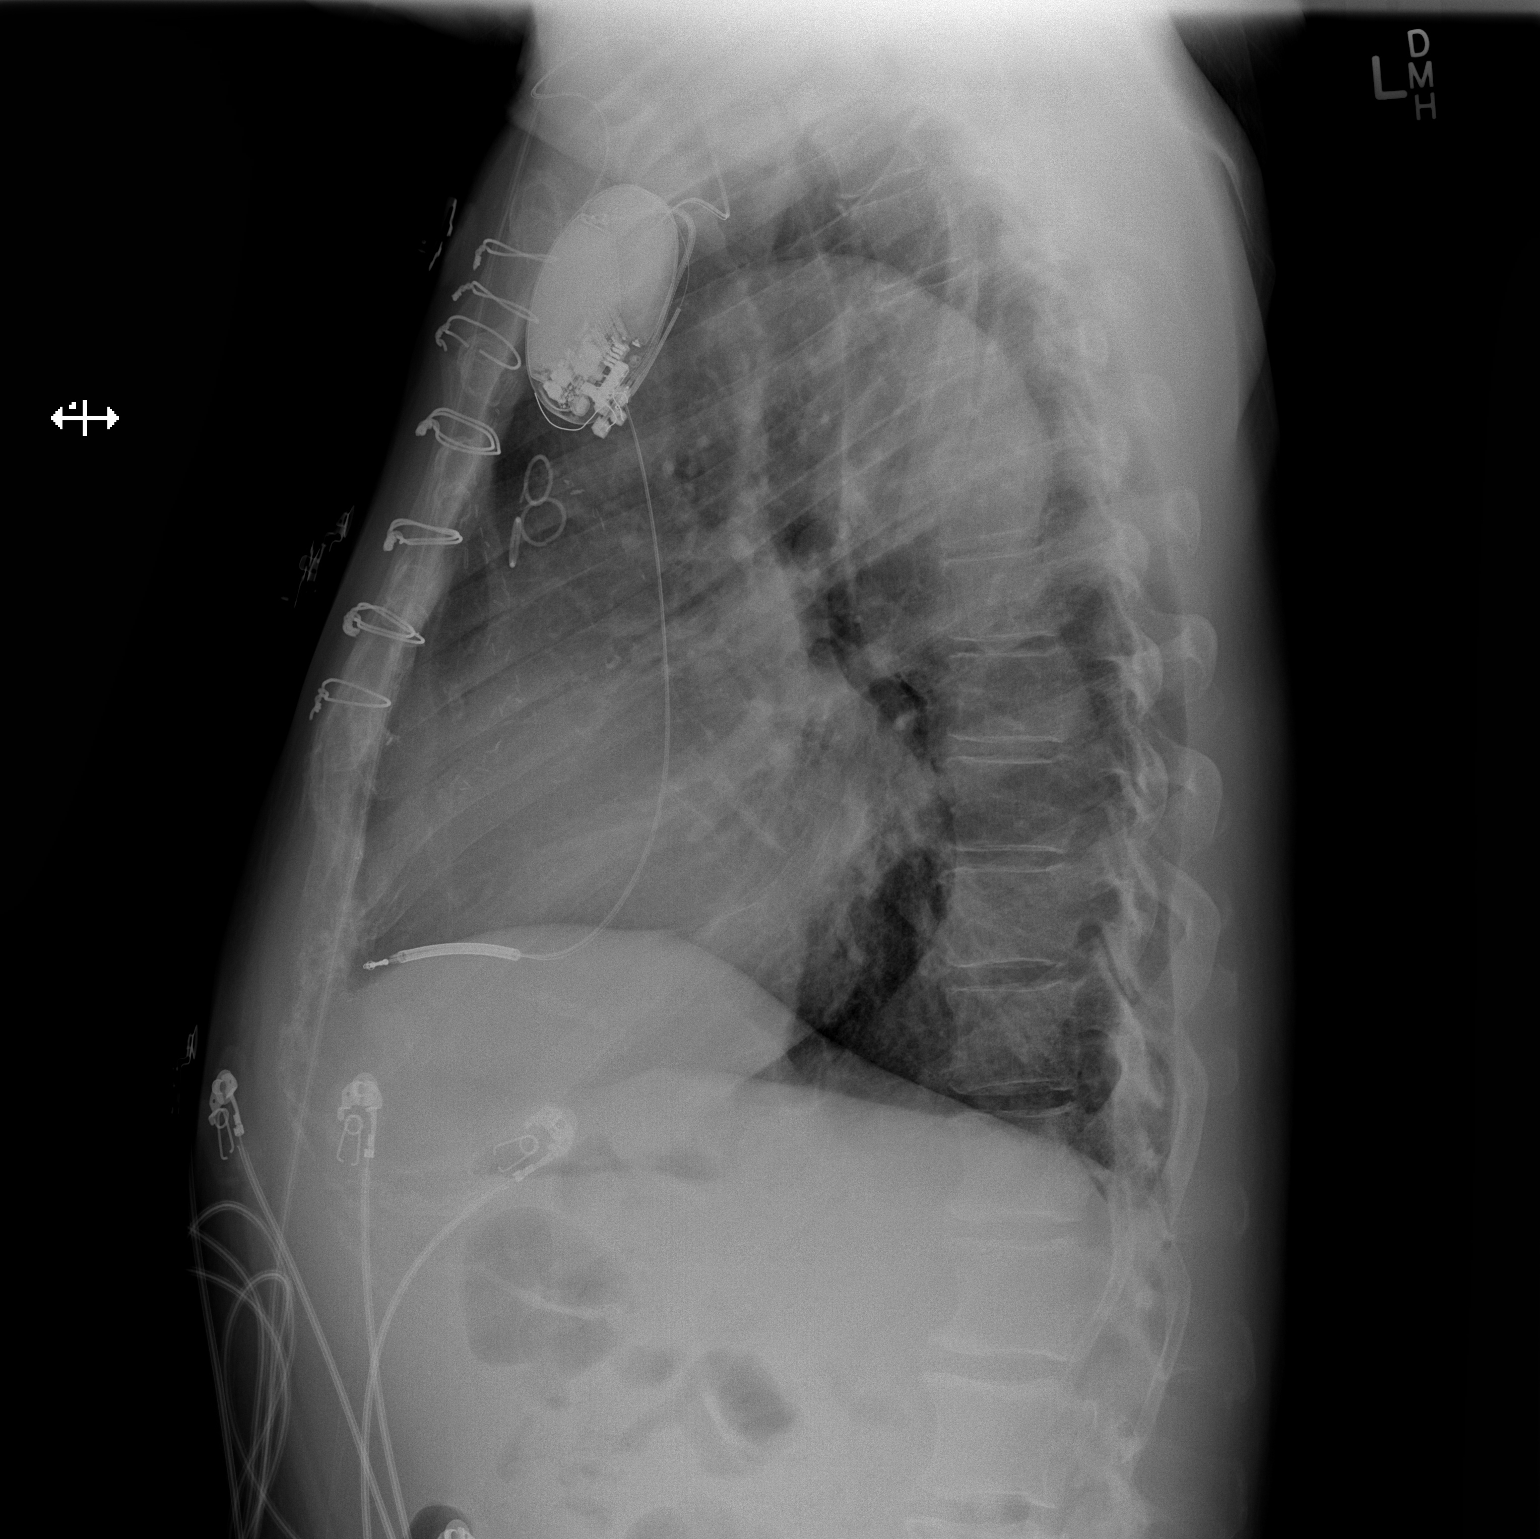

[2 of 2 positions shown; findings below may reference images not displayed]

FINDINGS: The patient's known tortuous aneurysmal thoracic aorta is stable on
this x-ray. No pneumothorax. The lungs remain clear. Stable AICD
device. Stable cardiomegaly.
IMPRESSION: The patient's known thoracic aortic aneurysm is stable based on this
x-ray. If there is concern for dissection, a CTA would be much more
sensitive. No interval changes on this x-ray.

## 2019-04-23 DIAGNOSIS — M542 Cervicalgia: Secondary | ICD-10-CM | POA: Diagnosis not present

## 2019-04-23 DIAGNOSIS — M79661 Pain in right lower leg: Secondary | ICD-10-CM | POA: Diagnosis not present

## 2019-04-23 DIAGNOSIS — M25552 Pain in left hip: Secondary | ICD-10-CM | POA: Diagnosis not present

## 2019-04-23 DIAGNOSIS — M79662 Pain in left lower leg: Secondary | ICD-10-CM | POA: Diagnosis not present

## 2019-04-24 DIAGNOSIS — D480 Neoplasm of uncertain behavior of bone and articular cartilage: Secondary | ICD-10-CM | POA: Diagnosis not present

## 2019-04-24 DIAGNOSIS — Z7902 Long term (current) use of antithrombotics/antiplatelets: Secondary | ICD-10-CM | POA: Diagnosis not present

## 2019-04-24 DIAGNOSIS — D481 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: Secondary | ICD-10-CM | POA: Diagnosis not present

## 2019-04-24 DIAGNOSIS — N189 Chronic kidney disease, unspecified: Secondary | ICD-10-CM | POA: Diagnosis not present

## 2019-04-24 DIAGNOSIS — R222 Localized swelling, mass and lump, trunk: Secondary | ICD-10-CM | POA: Diagnosis not present

## 2019-05-09 IMAGING — CT CT HIP*L* W/O CM
2 of 3 series · 17 of 46 positions shown, 19 images · non-contrast
Comparison: Radiographs dated 10/04/2018 and CT scan dated
06/01/2017

CLINICAL DATA: Left buttock mass.

EXAM:
CT OF THE LEFT HIP WITHOUT CONTRAST
TECHNIQUE: Multidetector CT imaging of the left hip was performed according to
the standard protocol. Multiplanar CT image reconstructions were
also generated.

[Series 3: pelvis 2.0 st · axial · 0.52mm/px · z∈[+628,+906]mm · 14 of 161 slices shown, 16 images]
[im 11/161  soft-tissue]
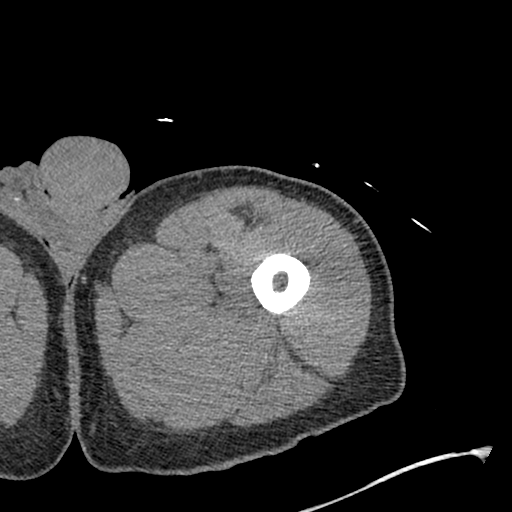
[im 11/161  bone]
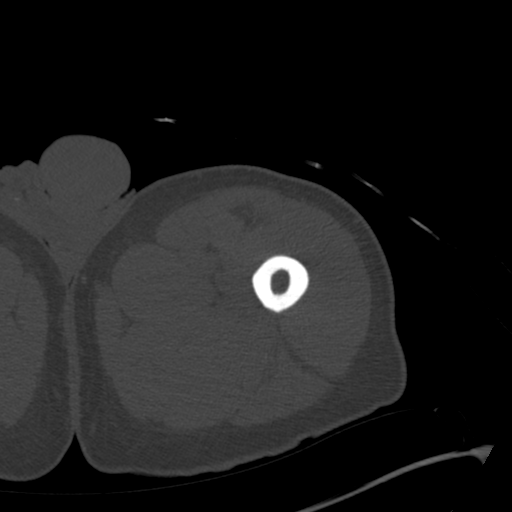
[im 21/161  soft-tissue]
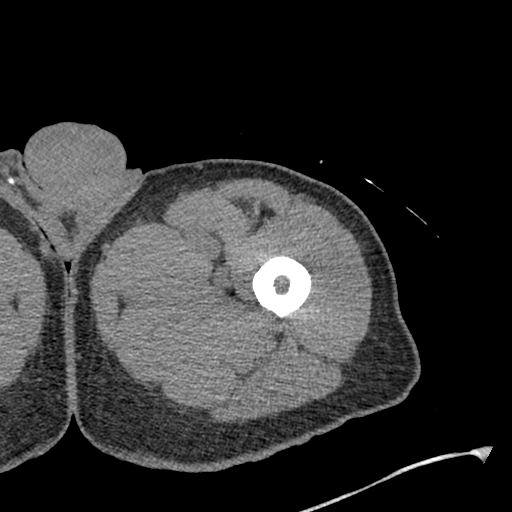
[im 31/161  soft-tissue]
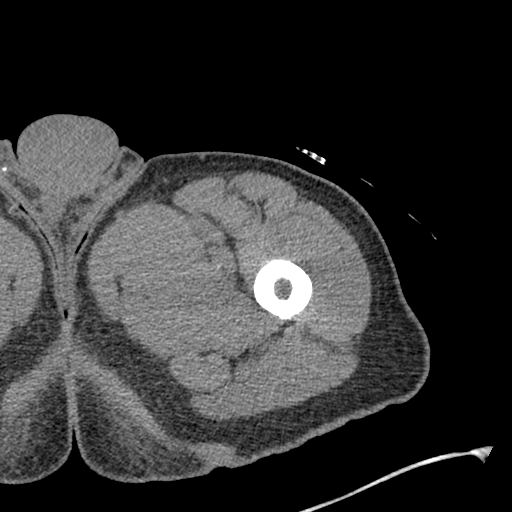
[im 42/161  soft-tissue]
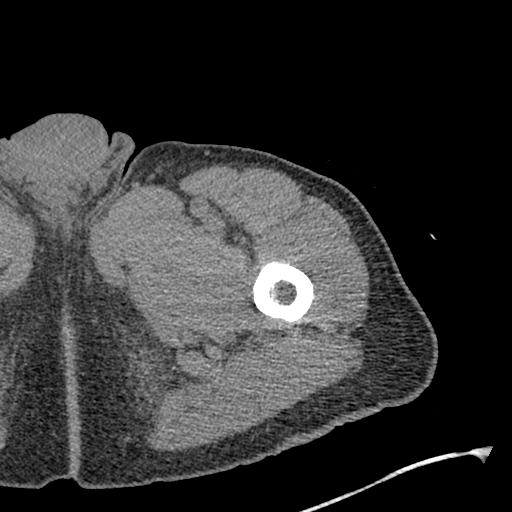
[im 52/161  soft-tissue]
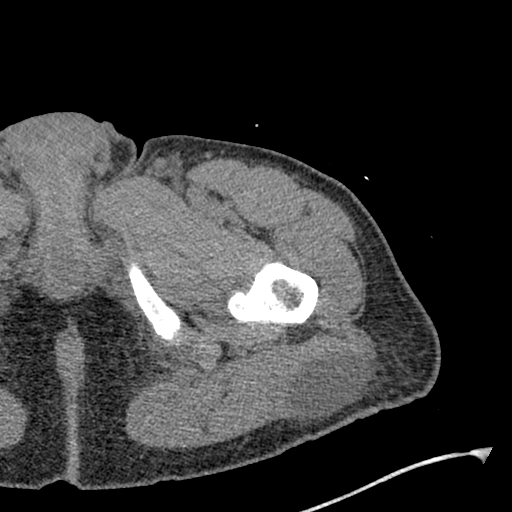
[im 62/161  soft-tissue]
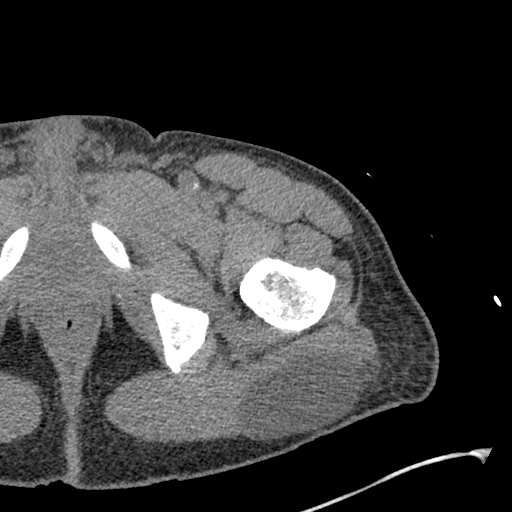
[im 73/161  soft-tissue]
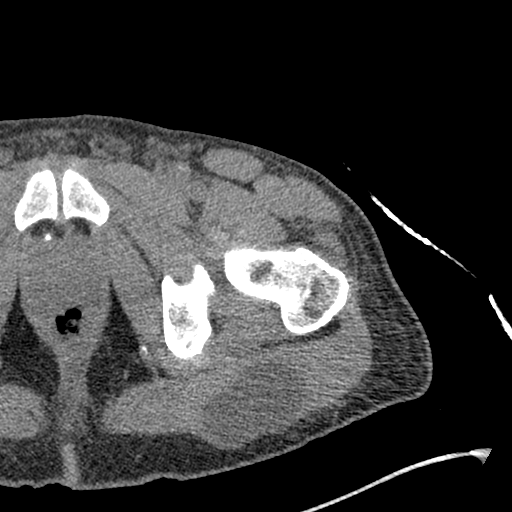
[im 88/161  soft-tissue]
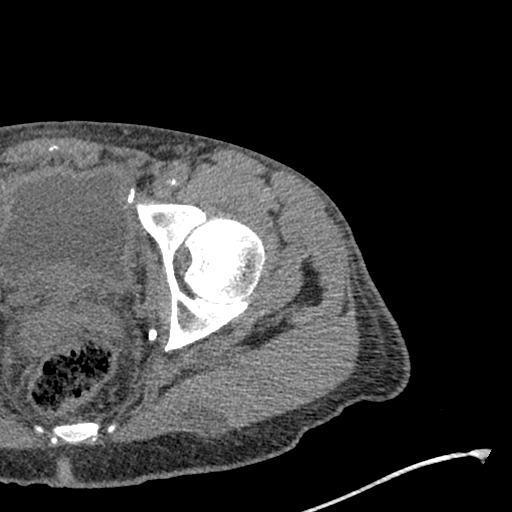
[im 99/161  soft-tissue]
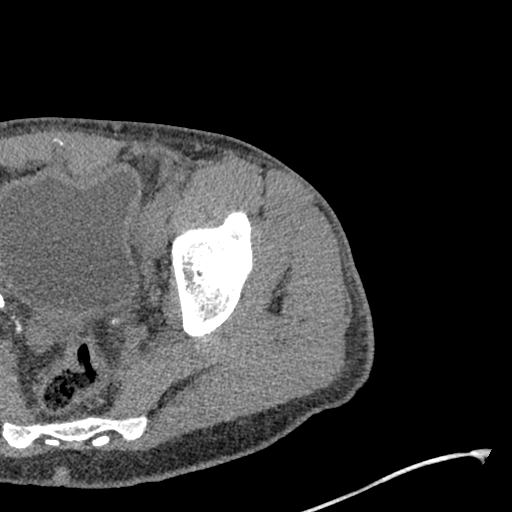
[im 99/161  bone]
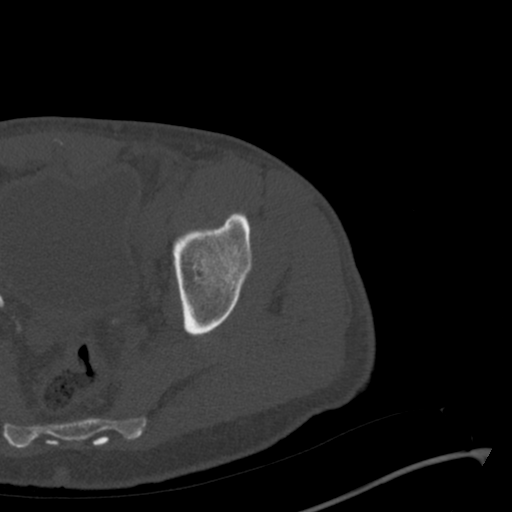
[im 109/161  soft-tissue]
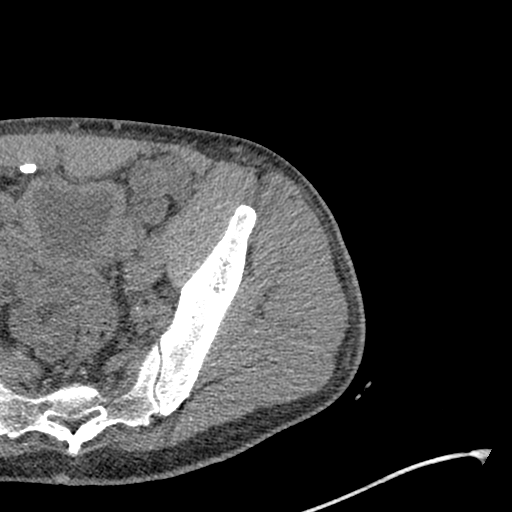
[im 119/161  soft-tissue]
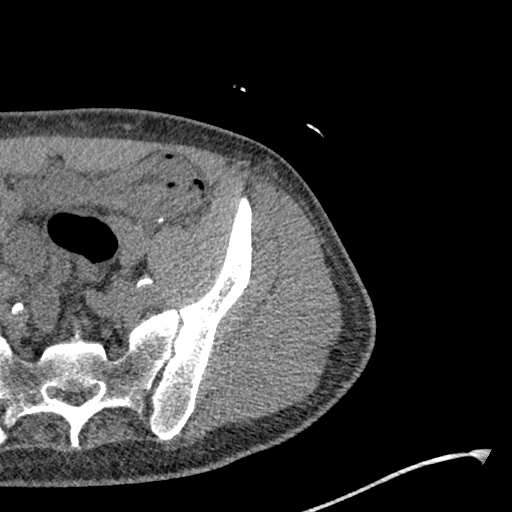
[im 130/161  soft-tissue]
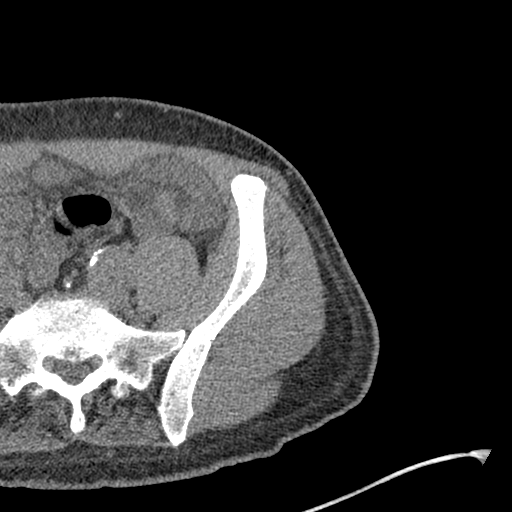
[im 140/161  soft-tissue]
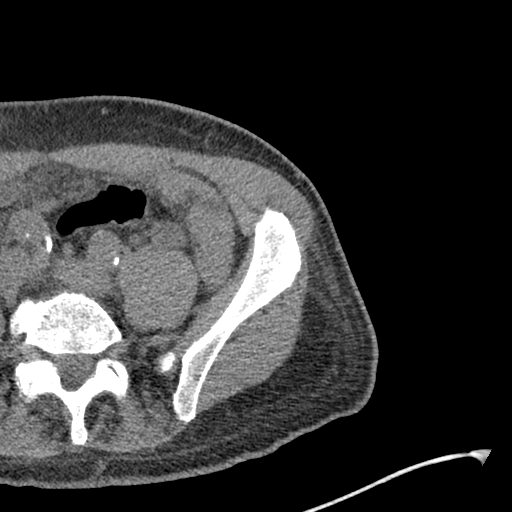
[im 150/161  soft-tissue]
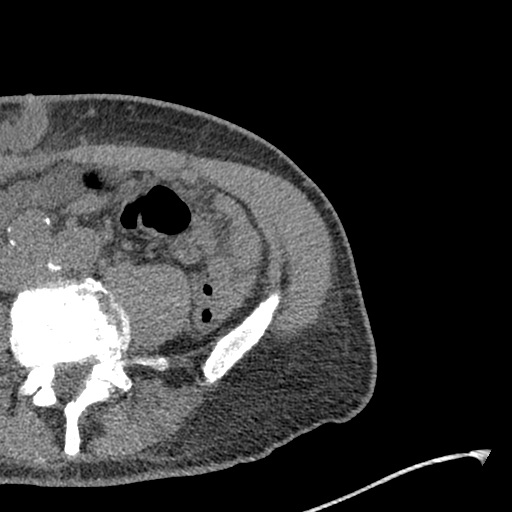

[Series 9: coronal st · coronal · 0.59mm/px · 3 of 110 slices shown]
[im 37/110  soft-tissue]
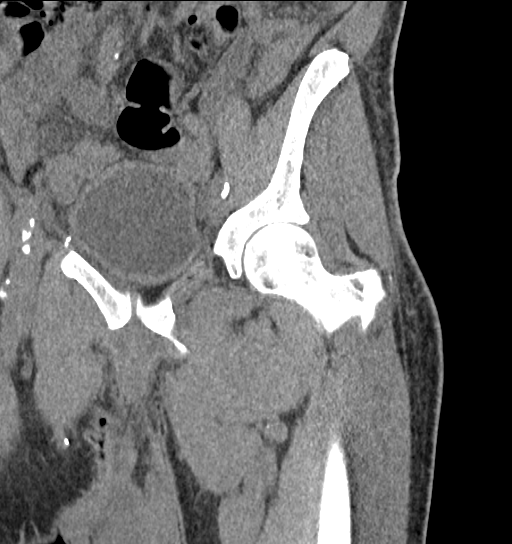
[im 49/110  soft-tissue]
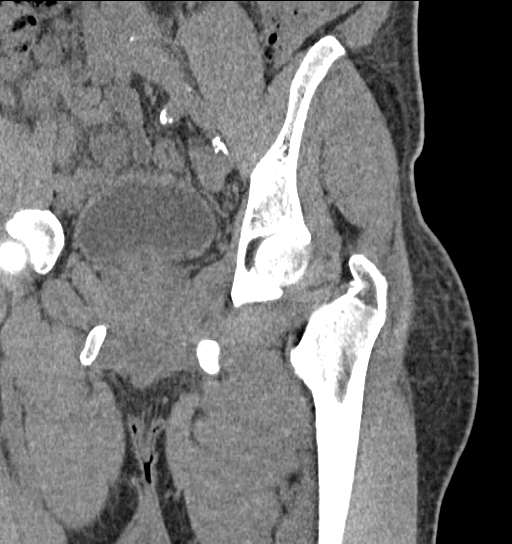
[im 61/110  soft-tissue]
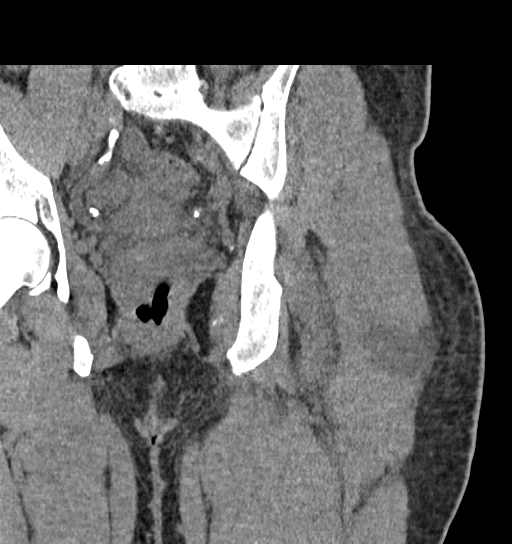

[17 of 46 positions shown; findings below may reference images not displayed]

FINDINGS: Bones/Joint/Cartilage

The visualized pelvic bones and the proximal left femur are normal.
Chronic degenerative disc and joint disease in the lower lumbar
spine.

Muscles and Tendons

There is an oblong low-density mass involving the left gluteus
maximus muscle. This measures approximately 12 x 5 x 4 cm. The
lesion is well-marginated. The adjacent soft tissues are otherwise
normal.

Soft tissues

Aortic atherosclerosis. Known previous thoracoabdominal aortic
dissection extending into the iliac arteries.
IMPRESSION: 1. Well-marginated low-density mass involving the left gluteus
maximus muscle. This could represent a benign or malignant neoplasm.
MRI of the left hip for evaluation of the soft tissue mass is
recommended. Optimally, this would be performed with and without
contrast.
2. No other significant abnormalities.

## 2019-05-12 ENCOUNTER — Other Ambulatory Visit: Payer: Self-pay

## 2019-05-12 ENCOUNTER — Ambulatory Visit (INDEPENDENT_AMBULATORY_CARE_PROVIDER_SITE_OTHER): Payer: Medicaid Other | Admitting: *Deleted

## 2019-05-12 DIAGNOSIS — I5022 Chronic systolic (congestive) heart failure: Secondary | ICD-10-CM | POA: Diagnosis not present

## 2019-05-12 DIAGNOSIS — Z95810 Presence of automatic (implantable) cardiac defibrillator: Secondary | ICD-10-CM

## 2019-05-13 LAB — CUP PACEART REMOTE DEVICE CHECK
Battery Remaining Longevity: 84 mo
Battery Remaining Percentage: 87 %
Brady Statistic RV Percent Paced: 0 %
Date Time Interrogation Session: 20200518072200
HighPow Impedance: 57 Ohm
Implantable Lead Implant Date: 20171114
Implantable Lead Location: 753860
Implantable Lead Model: 181
Implantable Lead Serial Number: 333496
Implantable Pulse Generator Implant Date: 20131220
Lead Channel Impedance Value: 484 Ohm
Lead Channel Pacing Threshold Amplitude: 0.8 V
Lead Channel Pacing Threshold Pulse Width: 0.4 ms
Lead Channel Setting Pacing Amplitude: 3.5 V
Lead Channel Setting Pacing Pulse Width: 0.4 ms
Lead Channel Setting Sensing Sensitivity: 0.5 mV
Pulse Gen Serial Number: 124654

## 2019-05-14 ENCOUNTER — Telehealth (HOSPITAL_COMMUNITY): Payer: Self-pay

## 2019-05-14 ENCOUNTER — Other Ambulatory Visit: Payer: Self-pay | Admitting: Family Medicine

## 2019-05-14 DIAGNOSIS — M1A3711 Chronic gout due to renal impairment, right ankle and foot, with tophus (tophi): Secondary | ICD-10-CM

## 2019-05-14 NOTE — Telephone Encounter (Signed)
Called patient to inform him to call his PCP for a refill on Diazepam.

## 2019-05-14 NOTE — Telephone Encounter (Signed)
This is not one of my meds. Please have him d/w PCP. Thanks

## 2019-05-14 NOTE — Telephone Encounter (Signed)
Pharmacy is requesting a refill for Diazepam 5mg  daily. Could not find in notes where it was prescribed by you. Please advise.

## 2019-05-21 ENCOUNTER — Other Ambulatory Visit: Payer: Self-pay

## 2019-05-21 ENCOUNTER — Encounter: Payer: Self-pay | Admitting: Cardiology

## 2019-05-21 ENCOUNTER — Ambulatory Visit (HOSPITAL_COMMUNITY)
Admission: RE | Admit: 2019-05-21 | Discharge: 2019-05-21 | Disposition: A | Discharge status: Home / Self Care | Payer: Medicaid Other | Source: Ambulatory Visit | Attending: Internal Medicine | Admitting: Internal Medicine

## 2019-05-21 VITALS — BP 126/64 | HR 70 | Wt 168.5 lb

## 2019-05-21 DIAGNOSIS — Z7902 Long term (current) use of antithrombotics/antiplatelets: Secondary | ICD-10-CM | POA: Diagnosis not present

## 2019-05-21 DIAGNOSIS — M545 Low back pain: Secondary | ICD-10-CM | POA: Diagnosis not present

## 2019-05-21 DIAGNOSIS — J45909 Unspecified asthma, uncomplicated: Secondary | ICD-10-CM | POA: Insufficient documentation

## 2019-05-21 DIAGNOSIS — N189 Chronic kidney disease, unspecified: Secondary | ICD-10-CM | POA: Diagnosis not present

## 2019-05-21 DIAGNOSIS — Z951 Presence of aortocoronary bypass graft: Secondary | ICD-10-CM | POA: Insufficient documentation

## 2019-05-21 DIAGNOSIS — I252 Old myocardial infarction: Secondary | ICD-10-CM | POA: Insufficient documentation

## 2019-05-21 DIAGNOSIS — F419 Anxiety disorder, unspecified: Secondary | ICD-10-CM | POA: Diagnosis not present

## 2019-05-21 DIAGNOSIS — I5022 Chronic systolic (congestive) heart failure: Secondary | ICD-10-CM | POA: Diagnosis not present

## 2019-05-21 DIAGNOSIS — I130 Hypertensive heart and chronic kidney disease with heart failure and stage 1 through stage 4 chronic kidney disease, or unspecified chronic kidney disease: Secondary | ICD-10-CM | POA: Insufficient documentation

## 2019-05-21 DIAGNOSIS — I472 Ventricular tachycardia: Secondary | ICD-10-CM | POA: Insufficient documentation

## 2019-05-21 DIAGNOSIS — Z79899 Other long term (current) drug therapy: Secondary | ICD-10-CM | POA: Insufficient documentation

## 2019-05-21 DIAGNOSIS — M1990 Unspecified osteoarthritis, unspecified site: Secondary | ICD-10-CM | POA: Diagnosis not present

## 2019-05-21 DIAGNOSIS — M109 Gout, unspecified: Secondary | ICD-10-CM | POA: Insufficient documentation

## 2019-05-21 DIAGNOSIS — E785 Hyperlipidemia, unspecified: Secondary | ICD-10-CM | POA: Insufficient documentation

## 2019-05-21 DIAGNOSIS — I2510 Atherosclerotic heart disease of native coronary artery without angina pectoris: Secondary | ICD-10-CM | POA: Diagnosis not present

## 2019-05-21 DIAGNOSIS — I255 Ischemic cardiomyopathy: Secondary | ICD-10-CM | POA: Diagnosis not present

## 2019-05-21 DIAGNOSIS — Z95810 Presence of automatic (implantable) cardiac defibrillator: Secondary | ICD-10-CM | POA: Insufficient documentation

## 2019-05-21 DIAGNOSIS — R011 Cardiac murmur, unspecified: Secondary | ICD-10-CM | POA: Insufficient documentation

## 2019-05-21 DIAGNOSIS — Z7982 Long term (current) use of aspirin: Secondary | ICD-10-CM | POA: Insufficient documentation

## 2019-05-21 LAB — COMPREHENSIVE METABOLIC PANEL
ALT: 14 U/L (ref 0–44)
AST: 16 U/L (ref 15–41)
Albumin: 3.8 g/dL (ref 3.5–5.0)
Alkaline Phosphatase: 68 U/L (ref 38–126)
Anion gap: 9 (ref 5–15)
BUN: 19 mg/dL (ref 6–20)
CO2: 27 mmol/L (ref 22–32)
Calcium: 9 mg/dL (ref 8.9–10.3)
Chloride: 103 mmol/L (ref 98–111)
Creatinine, Ser: 1.54 mg/dL — ABNORMAL HIGH (ref 0.61–1.24)
GFR calc Af Amer: 58 mL/min — ABNORMAL LOW (ref >60)
GFR calc non Af Amer: 50 mL/min — ABNORMAL LOW (ref >60)
Glucose, Bld: 108 mg/dL — ABNORMAL HIGH (ref 70–99)
Potassium: 3.5 mmol/L (ref 3.5–5.1)
Sodium: 139 mmol/L (ref 135–145)
Total Bilirubin: 1 mg/dL (ref 0.3–1.2)
Total Protein: 6.9 g/dL (ref 6.5–8.1)

## 2019-05-21 LAB — LIPID PANEL
Cholesterol: 206 mg/dL — ABNORMAL HIGH (ref 0–200)
HDL: 57 mg/dL (ref >40)
LDL Cholesterol: 132 mg/dL — ABNORMAL HIGH (ref 0–99)
Total CHOL/HDL Ratio: 3.6 RATIO
Triglycerides: 83 mg/dL (ref <150)
VLDL: 17 mg/dL (ref 0–40)

## 2019-05-21 MED ORDER — SILDENAFIL CITRATE 100 MG PO TABS: ORAL_TABLET | ORAL | 3 refills | Status: DC

## 2019-05-21 NOTE — Progress Notes (Signed)
Remote ICD transmission.   

## 2019-05-21 NOTE — Patient Instructions (Signed)
Stop Isosorbide  Labs today  Viagra has been sent into [facility] for you, make sure if you this you can not take Nitroglycerin   Your physician recommends that you schedule a follow-up appointment in: 4 months with echocardiogram

## 2019-05-21 NOTE — Progress Notes (Signed)
Advanced Heart Failure Clinic Note   Patient ID: Chad Bark., male   DOB: 09-24-1961, 58 y.o.   MRN: [id number]   Primary Cardiologist:  Dr. Glori Bickers  History of Present Illness: Chad [name] is a 58 y.o. male with history of severe HTN, coronary artery disease status post previous myocardial infarction and bypass surgery in 2006 also DES to native PDA in 2011.  He also has a history congestive heart failure secondary to ischemic cardiomyopathy EF 20-25%   He is s/p single chamber [employer] ICD.  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 Also with significant low back pain s/p 2 surgeries.   He has had episodes of CP but have avoided cath due to previous dissection and solitary kidney.  Myoview 11/2015  1. Evidence for very mild reversibility and ischemia involving the anterolateral wall and a portion of the lateral wall. 2. Large infarct involving the inferior wall. There is dyskinesia in the inferior wall. 3. Left ventricular ejection fraction is 32%. Left ventricle dilatation.  In 11/17 admitted for ICD shocks due to fractured RV lead. Underwent lead extraction and replacement.   We saw him in 2/18 for acute visit due to increasing dyspnea and volume overload. ECHO EF 20-25% moderate RV dysfunction. Moderate AI and severe MR. Ao Root 61mm-> 72mm. In 4/18, he saw Dr. 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 He was referred to [facility] Transplant team for further evaluation in 6/18 and felt to be too early for transplant based on  CPX.  Admitted 5/6-05/01/18 with chest pain. Troponins negative and EKG was unchanged. Chest pain thought to be related to HTN and possibly anxiety. He declined celexa. He was instructed to take extra 50 mg hydralazine for SBP >140 and 1 mg cardura for SBP >160.  Repeat CPX in 5/19 stable from previous Peak VO2: 18.7 (56% predicted peak VO2) VE/VCO2 slope: 34  Underwent lap chole in 12/19.   Returns for f/u. Feels good. Remains active. Has been to the beach and [place]. Denies CP or SOB. No edema, orthopnea or PND. Taking all meds.  BP typically 110-120/60-70s  ICD interrogated personally in clinic: No VT/AF. Activity level 1.4 hr/day    CPX 05/15/18 FVC 2.86 (67%)    FEV1 2.26 (68%)     FEV1/FVC 79 (100%)     MVV 106 (70%)  Resting HR: 71 Peak HR: 110  (67% age predicted max HR) BP rest: 100/64 BP peak: 118/66 Peak VO2: 18.7 (56% predicted peak VO2) VE/VCO2 slope: 34 OUES: 1.45 Peak RER: 1.04 Ventilatory Threshold: 11.8 (35% predicted and 63% measured peak VO2) VE/MVV: 43% O2pulse: 12  (80% predicted O2pulse)  CPX 4/18   FVC 3.11 (73%)    FEV1 2.40 (71%)     FEV1/FVC 77 (97%)     MVV 117 (77%)  Resting HR: 75 Peak HR: 134  (81% age predicted max HR) BP rest: 120/68 BP peak: 146/82 Peak VO2: 18.6 (55% predicted peak VO2) VE/VCO2 slope: 31 OUES: 2.01 Peak RER: 1.05 Ventilatory Threshold: 16.0 (47% predicted or measured peak VO2) VE/MVV: 49% O2pulse: 13  (  76% predicted O2pulse)  3/18: ECHO EF 20-25% Moderate AI, moderate to severe MR, moderate TR. Ao Root 50cm 10/22/2015: ECHO EF 20-25% 11/26/12 ECHO EF 35-40%  Review of systems complete and found to be negative unless listed in HPI.   Past Medical History:  Diagnosis Date   AICD (automatic cardioverter/defibrillator) present    Anemia    Anginal pain ([place])    Anxiety    Aortic dissection, thoracoabdominal ([place])    7/10: Type I s/p repair   Arthritis    Asthma    CAD (coronary  artery disease)    a. s/p CABG 2006;  b. DES to PDA 2011 (cath: Dx not seen, dRCA/PDA tx with DES; S-PDA occluded (culprit), S-Dx occluded, S-RI and OM ok, L-LAD ok   Carotid stenosis    dopplers 2011: 0-39% bilat.   Chest pain syndrome    CHF (congestive heart failure) (HCC)    Chronic bronchitis (HCC)    Chronic lower back pain    Chronic systolic heart failure ([place])    a. 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; the other is hanging on" (11/07/2016)   Dyspnea    patient denies at 12/06/2018 appt    Family history of adverse reaction to anesthesia    "sister hard to wake up"   GERD (gastroesophageal reflux disease)    Gout    Heart murmur    HLD (hyperlipidemia)    HTN (hypertension)    severe   Myocardial infarction [facility])    "many" (11/07/2016)   PONV (postoperative nausea and vomiting)    Sleep apnea    does not use mask    Thyroid cancer (HCC)    Hertle Cell    Current Outpatient Medications  Medication Sig Dispense Refill   albuterol (PROVENTIL HFA;VENTOLIN HFA) 108 (90 Base) MCG/ACT inhaler Inhale 2 puffs into the lungs every 6 (six) hours as needed for wheezing or shortness of breath. 1 Inhaler 0   aspirin EC 81 MG tablet Take 1 tablet (81 mg total) by mouth daily. Can take 4 tabs (324 mg total) as needed for chest pain. DO NOT TAKE multiple 325 mg tabs. 40 tablet 6   B Complex-C (SUPER B COMPLEX PO) Take 3 tablets by mouth daily.      carvedilol (COREG) 25 MG tablet TAKE 1 TABLET BY MOUTH TWICE DAILY WITH  A  MEAL (Patient taking differently: Take 25 mg by mouth 2 (two) times daily with a meal. ) 180 tablet 3   cephALEXin (KEFLEX) 500 MG capsule Take 1 capsule (500 mg total) by  mouth 4 (four) times daily. 28 capsule 0   clopidogrel (PLAVIX) 75 MG tablet Take 1 tablet (75 mg total) by mouth daily. 90 tablet 3   diazepam (VALIUM) 5 MG tablet Take 1 tablet (5 mg total) by mouth 2 (two) times daily. 10 tablet 0   doxazosin (CARDURA) 1 MG tablet Take 1 tablet (1 mg total) by mouth every 12 (twelve) hours as needed (for systolic BP > 443 mm Hg.). 90 tablet 3   fluticasone (FLONASE) 50 MCG/ACT nasal spray Place 2 sprays into both nostrils daily as needed for allergies or rhinitis.  furosemide (LASIX) 80 MG tablet Take 1 tablet (80 mg total) by mouth 2 (two) times daily. 180 tablet 3   hydrALAZINE (APRESOLINE) 50 MG tablet Take 1.5 tablets (75 mg total) by mouth 2 (two) times daily. 270 tablet 3   ipratropium-albuterol (DUONEB) 0.5-2.5 (3) MG/3ML SOLN Take 3 mLs by nebulization every 6 (six) hours as needed. (Patient taking differently: Take 3 mLs by nebulization every 6 (six) hours as needed (SOB, wheezing). ) 360 mL 3   isosorbide mononitrate (IMDUR) 60 MG 24 hr tablet Take 1 tablet (60 mg total) by mouth 2 (two) times daily. 180 tablet 3   MITIGARE 0.6 MG CAPS TAKE 2 CAPSULES BY MOUTH ONCE DAILY AS NEEDED (GOUT  ATTACK) 30 capsule 0   nitroGLYCERIN (NITROSTAT) 0.4 MG SL tablet PLACE 1 TABLET UNDER THE TONGUE EVERY 5 MINUTES FOR 3 DOSES AS NEEDED FOR CHEST PAIN (Patient taking differently: Place 0.4 mg under the tongue every 5 (five) minutes as needed for chest pain. ) 25 tablet 5   Oxycodone HCl 10 MG TABS Take 20 mg by mouth every 8 (eight) hours.      potassium chloride (KLOR-CON M10) 10 MEQ tablet Take 1 tablet (10 mEq total) by mouth daily. 90 tablet 3   sacubitril-valsartan (ENTRESTO) 97-103 MG Take 1 tablet by mouth 2 (two) times daily. 180 tablet 3   sildenafil (VIAGRA) 100 MG tablet TAKE 1/2 (ONE-HALF) TABLET BY MOUTH ONCE DAILY AS NEEDED FOR ERECTILE DYSFUNCTION 10 tablet 0   spironolactone (ALDACTONE) 25 MG tablet Take 0.5 tablets (12.5 mg total) by  mouth daily. 45 tablet 3   ursodiol (ACTIGALL) 500 MG tablet Take 1 tablet by mouth twice daily 60 tablet 0   No current facility-administered medications for this encounter.     Allergies  Allergen Reactions   Contrast Media [Iodinated Diagnostic Agents] Anaphylaxis   Iohexol Anaphylaxis and Other (See Comments)    PT HAS ANAPHYLAXIS WITH CONTRAST MEDIA!   Lipitor [Atorvastatin Calcium] Anaphylaxis and Other (See Comments)    Large doses   Shellfish Allergy Anaphylaxis   Sulfa Antibiotics Shortness Of Breath and Swelling   Sulfonamide Derivatives Shortness Of Breath and Swelling   Almond Oil Itching    FACIAL/MOUTH ITCHING   Metrizamide Swelling    SWELLING REACTION UNSPECIFIED    Zocor [Simvastatin] Other (See Comments)    Muscle cramps   Latex Rash and Other (See Comments)    With long periods of exposure   Peach [Prunus Persica] Itching    Vital Signs: Vitals:   05/21/19 1108  BP: 126/64  Pulse: 70  SpO2: 97%  Weight: 76.4 kg (168 lb 8 oz)   There were no vitals filed for this visit.  Wt Readings from Last 3 Encounters:  03/22/19 72.6 kg (160 lb)  12/09/18 75 kg (165 lb 5.5 oz)  12/06/18 75.3 kg (166 lb)   Physical Exam: General:  Well appearing. No resp difficulty HEENT: normal Neck: supple. no JVD. Carotids 2+ bilat; no bruits. No lymphadenopathy or thryomegaly appreciated. Cor: PMI nondisplaced. Regular rate & rhythm. No rubs, gallops or murmurs. Lungs: clear Abdomen: soft, nontender, nondistended. No hepatosplenomegaly. No bruits or masses. Good bowel sounds. Extremities: no cyanosis, clubbing, rash, edema Neuro: alert & orientedx3, cranial nerves grossly intact. moves all 4 extremities w/o difficulty. Affect pleasant  ECG: NSR 73 LVH with repol abnl Personally reviewed  ASSESSMENT/Plan  1. Chronic systolic HF: Ischemic cardiomyopathy.  Echo (2/16) with EF 25%. S/p [employer] ICD. Echo 3/18. EF 20-25%.  Echo 04/18/18: EF 20-25%, grade 2  DD - RHC 04/05/18 with well compensated filling pressures with mildly to moderately reduced CO.  - Repeat CPX in 5/19 stable from previous Peak VO2: 18.7 (56% predicted peak VO2) VE/VCO2 slope: 34 - He has seen w Dr. Prescott Gum. Given previous dissection and aortic root replacement likely not a candidate for VAD here. - He has been seen previously at [facility] for transplant eval but CPX too good at this point.  - Stable Now NYHA II-III.  - Continue lasix 80 mg BID. Take extra as needed - Continue Entresto 97/103 bid - Continue carvedilol 25 mg twice a day.  - Continue hydralazine 75 mg bid - Will stop Imdur with Viagra - Continue spironolactone 12.5 mg daily.  - Repeat echo and CPX soon when COVID permits  2. CAD s/p CABG 2006:  - No CP currently.  - Cardiolite 11/2015 with evidence for mild reversibility involving the anterolateral wall and a portion of the lateral wall. - We have avoided left heart cath due to dissection and CKD with solitary kidney (lost kidney due to dissection) - Continue Plavix and ASA 81 mg daily.   - Statin intolerance at any dose. Also unable to tolerate Zetia - Last LDL 131 in 2019 - Check labs today.  - Refer to Lipid Clinic for possible PCSK-9    3. CKD in setting of solitary kidney s/p dissection - Recent BMET with creatinine 1.4 (baseline 1.3 - 1.6)  4. HTN:  - Blood pressure well controlled. Continue current regimen. - Stressed importance of not over-treating in setting of solitary kidney.  5. H/o Type I aortic dissection: Follows with CVTS.  - He has seen w Dr. Prescott Gum. Given previous dissection and aortic root replacement likely not candidate for VAD. - Echo 04/18/18: aortic root, ascending aorta, and aortic arch all mildly dilated  6. NSVT - ICD interrogated personally today. No VT/AF.     Glori Bickers, MD 10:30 AM

## 2019-05-22 ENCOUNTER — Telehealth (HOSPITAL_COMMUNITY): Payer: Self-pay | Admitting: Cardiology

## 2019-05-22 DIAGNOSIS — M25551 Pain in right hip: Secondary | ICD-10-CM | POA: Diagnosis not present

## 2019-05-22 DIAGNOSIS — M25552 Pain in left hip: Secondary | ICD-10-CM | POA: Diagnosis not present

## 2019-05-22 DIAGNOSIS — M79661 Pain in right lower leg: Secondary | ICD-10-CM | POA: Diagnosis not present

## 2019-05-22 DIAGNOSIS — E7800 Pure hypercholesterolemia, unspecified: Secondary | ICD-10-CM

## 2019-05-22 DIAGNOSIS — M79662 Pain in left lower leg: Secondary | ICD-10-CM | POA: Diagnosis not present

## 2019-05-22 NOTE — Telephone Encounter (Signed)
Notes recorded by Kerry Dory, CMA on 05/22/2019 at 10:18 AM EDT Patient aware. Patient voiced understanding Referral placed   ------  Notes recorded by Bensimhon, Shaune Pascal, MD on 05/21/2019 at 3:23 PM EDT Please refer to lipid clinic for PCSK-9 treatment

## 2019-05-23 ENCOUNTER — Encounter (HOSPITAL_COMMUNITY): Payer: Self-pay | Admitting: *Deleted

## 2019-05-23 ENCOUNTER — Telehealth (HOSPITAL_COMMUNITY): Payer: Self-pay | Admitting: *Deleted

## 2019-05-23 NOTE — Telephone Encounter (Signed)
Pt left message at front desk that he needs a letter stating he can't do his job duties due to [employer].  Per Dr Haroldine Laws we can not give him a letter stating that or that he can not work due to [employer].  Pt is aware and then just ask for letter stating we are treating him, his dx and that he is on meds for his conditions.  Letter for this completed, signed by Dr Haroldine Laws and placed at front desk for pt to p/u, he is aware.

## 2019-06-09 ENCOUNTER — Other Ambulatory Visit: Payer: Self-pay | Admitting: Internal Medicine

## 2019-06-12 ENCOUNTER — Telehealth (HOSPITAL_COMMUNITY): Payer: Self-pay | Admitting: *Deleted

## 2019-06-12 ENCOUNTER — Other Ambulatory Visit: Payer: Self-pay | Admitting: Family Medicine

## 2019-06-12 DIAGNOSIS — M1A3711 Chronic gout due to renal impairment, right ankle and foot, with tophus (tophi): Secondary | ICD-10-CM

## 2019-06-12 NOTE — Telephone Encounter (Signed)
Pt left VM wanting to know if Dr.Bensimhon thinks its ok for him to fly on an airplane.   Routed to [place] for advice

## 2019-06-13 NOTE — Telephone Encounter (Signed)
Yes as long as he takes COVID precautions

## 2019-06-16 NOTE — Telephone Encounter (Signed)
Pt aware.

## 2019-06-17 ENCOUNTER — Other Ambulatory Visit: Payer: Self-pay | Admitting: Family Medicine

## 2019-06-17 DIAGNOSIS — M1A3711 Chronic gout due to renal impairment, right ankle and foot, with tophus (tophi): Secondary | ICD-10-CM

## 2019-06-24 DIAGNOSIS — M79662 Pain in left lower leg: Secondary | ICD-10-CM | POA: Diagnosis not present

## 2019-06-24 DIAGNOSIS — G894 Chronic pain syndrome: Secondary | ICD-10-CM | POA: Diagnosis not present

## 2019-06-24 DIAGNOSIS — M79661 Pain in right lower leg: Secondary | ICD-10-CM | POA: Diagnosis not present

## 2019-06-24 DIAGNOSIS — M542 Cervicalgia: Secondary | ICD-10-CM | POA: Diagnosis not present

## 2019-07-22 DIAGNOSIS — M25551 Pain in right hip: Secondary | ICD-10-CM | POA: Diagnosis not present

## 2019-07-22 DIAGNOSIS — M79661 Pain in right lower leg: Secondary | ICD-10-CM | POA: Diagnosis not present

## 2019-07-22 DIAGNOSIS — M542 Cervicalgia: Secondary | ICD-10-CM | POA: Diagnosis not present

## 2019-07-22 DIAGNOSIS — M79662 Pain in left lower leg: Secondary | ICD-10-CM | POA: Diagnosis not present

## 2019-07-23 ENCOUNTER — Other Ambulatory Visit: Payer: Self-pay | Admitting: Family Medicine

## 2019-07-23 DIAGNOSIS — M1A3711 Chronic gout due to renal impairment, right ankle and foot, with tophus (tophi): Secondary | ICD-10-CM

## 2019-08-11 ENCOUNTER — Ambulatory Visit (INDEPENDENT_AMBULATORY_CARE_PROVIDER_SITE_OTHER): Payer: Medicaid Other | Admitting: *Deleted

## 2019-08-11 DIAGNOSIS — I5022 Chronic systolic (congestive) heart failure: Secondary | ICD-10-CM

## 2019-08-11 DIAGNOSIS — Z95810 Presence of automatic (implantable) cardiac defibrillator: Secondary | ICD-10-CM

## 2019-08-12 ENCOUNTER — Telehealth: Payer: Self-pay

## 2019-08-12 NOTE — Telephone Encounter (Signed)
Spoke with patient to remind of missed remote transmission 

## 2019-08-13 LAB — CUP PACEART REMOTE DEVICE CHECK
Battery Remaining Longevity: 84 mo
Battery Remaining Percentage: 86 %
Brady Statistic RV Percent Paced: 0 %
Date Time Interrogation Session: 20200819035000
HighPow Impedance: 61 Ohm
Implantable Lead Implant Date: 20171114
Implantable Lead Location: 753860
Implantable Lead Model: 181
Implantable Lead Serial Number: 333496
Implantable Pulse Generator Implant Date: 20131220
Lead Channel Impedance Value: 491 Ohm
Lead Channel Pacing Threshold Amplitude: 0.8 V
Lead Channel Pacing Threshold Pulse Width: 0.4 ms
Lead Channel Setting Pacing Amplitude: 3.5 V
Lead Channel Setting Pacing Pulse Width: 0.4 ms
Lead Channel Setting Sensing Sensitivity: 0.5 mV
Pulse Gen Serial Number: 124654

## 2019-08-19 ENCOUNTER — Encounter: Payer: Self-pay | Admitting: Cardiology

## 2019-08-19 DIAGNOSIS — M542 Cervicalgia: Secondary | ICD-10-CM | POA: Diagnosis not present

## 2019-08-19 DIAGNOSIS — M25511 Pain in right shoulder: Secondary | ICD-10-CM | POA: Diagnosis not present

## 2019-08-19 DIAGNOSIS — M25512 Pain in left shoulder: Secondary | ICD-10-CM | POA: Diagnosis not present

## 2019-08-19 DIAGNOSIS — M545 Low back pain: Secondary | ICD-10-CM | POA: Diagnosis not present

## 2019-08-19 NOTE — Progress Notes (Signed)
Remote ICD transmission.   

## 2019-08-24 ENCOUNTER — Other Ambulatory Visit (HOSPITAL_COMMUNITY): Payer: Self-pay | Admitting: Internal Medicine

## 2019-09-04 ENCOUNTER — Encounter: Payer: Self-pay | Admitting: Pharmacist

## 2019-09-04 ENCOUNTER — Other Ambulatory Visit: Payer: Self-pay

## 2019-09-04 ENCOUNTER — Ambulatory Visit: Payer: Medicaid Other | Admitting: Pharmacist

## 2019-09-04 DIAGNOSIS — E785 Hyperlipidemia, unspecified: Secondary | ICD-10-CM | POA: Diagnosis not present

## 2019-09-04 NOTE — Patient Instructions (Addendum)
It was a pleasure to meet you today. We will submit a prior authorization to your insurance for either Praluent or Repatha. We will be in contact once approved.  Feel free to call us at [phone] with any questions or concerns.

## 2019-09-04 NOTE — Progress Notes (Addendum)
Patient ID: Chad [name].                 DOB: 18-May-1961                    MRN: [id number]     HPI: Chad [name]. is a 58 y.o. male patient referred to lipid clinic by Dr. Haroldine Laws. PMH is significant for severe HTN, coronary artery disease status post previous myocardial infarction and bypass surgery in 2006 also DES to native PDA in 2011.  He also has a history congestive heart failure secondary to ischemic cardiomyopathy EF 20-25%   He is s/p single chamber [employer] ICD.  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 Also with significant low back pain s/p 2 surgeries.    Patient presents today to the lipid clinic. He states that he really isn't interested in adding another medication, but he is willing to try. Had severe muscle cramps with statins.  Current Medications: none Intolerances: atorvastatin 40mg  daily, atorvastatin 10mg  every other day, atorvastatin 10mg  twice a week, simvastatin 40mg  daily, Zetia 10mg  daily, pravastatin 20mg  daily (severe cramps) Risk Factors: ASCVD LDL goal: <70  Labs: 05/21/2019 TC 206, TG 83, HDL 57, LDL 132 (no medication)  Past Medical History:  Diagnosis Date  . AICD (automatic cardioverter/defibrillator) present   . Anemia   . Anginal pain ([name])   . Anxiety   . Aortic dissection, thoracoabdominal ([place])    7/10: Type I s/p repair  . Arthritis   . Asthma   . CAD (coronary artery disease)    a. s/p CABG 2006;  b. DES to PDA 2011 (cath: Dx not seen, dRCA/PDA tx with DES; S-PDA occluded (culprit), S-Dx occluded, S-RI and OM ok, L-LAD ok  . Carotid stenosis    dopplers 2011: 0-39% bilat.  . Chest pain syndrome   . CHF (congestive heart failure) ([place])   . Chronic bronchitis ([place])   . Chronic lower back  pain   . Chronic systolic heart failure (HCC)    a. 12/13 ECHO: EF 35-40%, sept, apical & posterobasal HK, LV mod dil & sys fx mod reduced, mild AI, MV mild reg, TV mild reg  . Complication of anesthesia    "difficult to wake afterwards a couple of times" and patient states he has woken during surgery   . 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 Dyspnea    patient denies at 12/06/2018 appt   . Family history of adverse reaction to anesthesia    "sister hard to wake up"  . GERD (gastroesophageal reflux disease)   . Gout   . Heart murmur   . HLD (hyperlipidemia)   . HTN (hypertension)    severe  . Myocardial infarction [facility])    "many" (11/07/2016)  . PONV (postoperative nausea and vomiting)   . Sleep apnea    does not use mask   . Thyroid cancer ([place])    Hertle Cell    Current Outpatient Medications on File Prior to Visit  Medication Sig Dispense Refill  . albuterol (PROVENTIL HFA;VENTOLIN HFA) 108 (90 Base) MCG/ACT inhaler Inhale 2 puffs into the lungs every 6 (six) hours as needed for wheezing  or shortness of breath. 1 Inhaler 0  . aspirin EC 81 MG tablet Take 1 tablet (81 mg total) by mouth daily. Can take 4 tabs (324 mg total) as needed for chest pain. DO NOT TAKE multiple 325 mg tabs. 40 tablet 6  . B Complex-C (SUPER B COMPLEX PO) Take 3 tablets by mouth daily.     . carvedilol (COREG) 25 MG tablet TAKE 1 TABLET BY MOUTH TWICE DAILY WITH MEALS 180 tablet 0  . clopidogrel (PLAVIX) 75 MG tablet Take 1 tablet (75 mg total) by mouth daily. 90 tablet 3  . cyclobenzaprine (FLEXERIL) 10 MG tablet Take 10 mg by mouth 3 (three) times daily as needed for muscle spasms.    . diazepam (VALIUM) 5 MG tablet Take 1 tablet (5 mg total) by mouth 2 (two) times daily. 10 tablet 0  . doxazosin (CARDURA) 1 MG tablet Take 1 tablet (1 mg total) by mouth every 12 (twelve) hours as needed (for systolic BP > 0000000 mm Hg.). 90 tablet 3  .  fluticasone (FLONASE) 50 MCG/ACT nasal spray Place 2 sprays into both nostrils daily as needed for allergies or rhinitis.     . furosemide (LASIX) 80 MG tablet Take 1 tablet (80 mg total) by mouth 2 (two) times daily. 180 tablet 3  . hydrALAZINE (APRESOLINE) 50 MG tablet Take 1.5 tablets (75 mg total) by mouth 2 (two) times daily. 270 tablet 3  . ipratropium-albuterol (DUONEB) 0.5-2.5 (3) MG/3ML SOLN Take 3 mLs by nebulization every 6 (six) hours as needed. (Patient taking differently: Take 3 mLs by nebulization every 6 (six) hours as needed (SOB, wheezing). ) 360 mL 3  . MITIGARE 0.6 MG CAPS TAKE 2 CAPSULES BY MOUTH ONCE DAILY AS NEEDED (GOUT  ATTACK) 30 capsule 0  . nitroGLYCERIN (NITROSTAT) 0.4 MG SL tablet PLACE 1 TABLET UNDER THE TONGUE EVERY 5 MINUTES FOR 3 DOSES AS NEEDED FOR CHEST PAIN 25 tablet 5  . Oxycodone HCl 10 MG TABS Take 20 mg by mouth every 8 (eight) hours.     . potassium chloride (KLOR-CON M10) 10 MEQ tablet Take 1 tablet (10 mEq total) by mouth daily. 90 tablet 3  . sacubitril-valsartan (ENTRESTO) 97-103 MG Take 1 tablet by mouth 2 (two) times daily. 180 tablet 3  . sildenafil (VIAGRA) 100 MG tablet TAKE 1/2 (ONE-HALF) TABLET BY MOUTH ONCE DAILY AS NEEDED FOR ERECTILE DYSFUNCTION 30 tablet 3  . spironolactone (ALDACTONE) 25 MG tablet Take 0.5 tablets (12.5 mg total) by mouth daily. 45 tablet 3  . ursodiol (ACTIGALL) 500 MG tablet Take 1 tablet by mouth twice daily 60 tablet 0   No current facility-administered medications on file prior to visit.     Allergies  Allergen Reactions  . Contrast Media [Iodinated Diagnostic Agents] Anaphylaxis  . Iohexol Anaphylaxis and Other (See Comments)    PT HAS ANAPHYLAXIS WITH CONTRAST MEDIA!  . Lipitor [Atorvastatin Calcium] Anaphylaxis and Other (See Comments)    Large doses  . Shellfish Allergy Anaphylaxis  . Sulfa Antibiotics Shortness Of Breath and Swelling  . Sulfonamide Derivatives Shortness Of Breath and Swelling  . Almond Oil  Itching    FACIAL/MOUTH ITCHING  . Metrizamide Swelling    SWELLING REACTION UNSPECIFIED   . Zocor [Simvastatin] Other (See Comments)    Muscle cramps  . Latex Rash and Other (See Comments)    With long periods of exposure  . Peach [Prunus Persica] Itching    Assessment/Plan:  1. Hyperlipidemia - LDL above goal of <  70. Patient cannot tolerate statins. He gets severe muscle cramps. Is willing to try PCSK-9 inhibitors. Reviewed side effects with patient in detail, including runny nose, muscle pains, diarrhea, injection site reaction and flu like reaction. Reviewed injection technique. Will submit PA to medicaid.   Thank you,  Ramond Dial, Pharm.D, [place]  [id number] [place], [place] [address]  Phone: [phone]; Fax: [phone]

## 2019-09-05 ENCOUNTER — Other Ambulatory Visit (HOSPITAL_COMMUNITY): Payer: Self-pay | Admitting: Internal Medicine

## 2019-09-11 ENCOUNTER — Ambulatory Visit: Payer: Medicaid Other

## 2019-09-12 ENCOUNTER — Telehealth: Payer: Self-pay | Admitting: Pharmacist

## 2019-09-12 MED ORDER — PRALUENT 75 MG/ML ~~LOC~~ SOAJ: 1.0000 "pen " | SUBCUTANEOUS | 11 refills | Status: DC

## 2019-09-12 NOTE — Telephone Encounter (Signed)
Praluent 75mg  approved through 09/05/2020. Submitted under Dr. Celestia Khat to [facility] Patient made aware. He would like to do his first injection in the clinic. He will come in Tues 9/22 @ 8:00 for his first injection.

## 2019-09-16 NOTE — Progress Notes (Signed)
No show

## 2019-09-17 ENCOUNTER — Ambulatory Visit: Payer: Medicaid Other

## 2019-09-17 ENCOUNTER — Ambulatory Visit (HOSPITAL_COMMUNITY): Admission: RE | Admit: 2019-09-17 | Payer: Medicaid Other | Source: Ambulatory Visit

## 2019-09-17 ENCOUNTER — Inpatient Hospital Stay (HOSPITAL_COMMUNITY)
Admission: RE | Admit: 2019-09-17 | Discharge: 2019-09-17 | Disposition: A | Discharge status: Home / Self Care | Payer: Medicaid Other | Source: Ambulatory Visit | Attending: Internal Medicine | Admitting: Internal Medicine

## 2019-09-18 DIAGNOSIS — M25552 Pain in left hip: Secondary | ICD-10-CM | POA: Diagnosis not present

## 2019-09-18 DIAGNOSIS — M79661 Pain in right lower leg: Secondary | ICD-10-CM | POA: Diagnosis not present

## 2019-09-18 DIAGNOSIS — G894 Chronic pain syndrome: Secondary | ICD-10-CM | POA: Diagnosis not present

## 2019-09-18 DIAGNOSIS — M79662 Pain in left lower leg: Secondary | ICD-10-CM | POA: Diagnosis not present

## 2019-09-29 ENCOUNTER — Ambulatory Visit (INDEPENDENT_AMBULATORY_CARE_PROVIDER_SITE_OTHER): Payer: Medicaid Other | Admitting: Family Medicine

## 2019-09-29 ENCOUNTER — Encounter: Payer: Self-pay | Admitting: Family Medicine

## 2019-09-29 ENCOUNTER — Other Ambulatory Visit: Payer: Self-pay

## 2019-09-29 ENCOUNTER — Other Ambulatory Visit: Payer: Self-pay | Admitting: Family Medicine

## 2019-09-29 ENCOUNTER — Other Ambulatory Visit (HOSPITAL_COMMUNITY): Payer: Self-pay | Admitting: Internal Medicine

## 2019-09-29 VITALS — BP 110/68 | HR 73 | Wt 173.0 lb

## 2019-09-29 DIAGNOSIS — M1A3711 Chronic gout due to renal impairment, right ankle and foot, with tophus (tophi): Secondary | ICD-10-CM

## 2019-09-29 DIAGNOSIS — Z0000 Encounter for general adult medical examination without abnormal findings: Secondary | ICD-10-CM

## 2019-09-29 DIAGNOSIS — M1A0610 Idiopathic chronic gout, right knee, without tophus (tophi): Secondary | ICD-10-CM | POA: Diagnosis not present

## 2019-09-29 DIAGNOSIS — M1A0710 Idiopathic chronic gout, right ankle and foot, without tophus (tophi): Secondary | ICD-10-CM | POA: Diagnosis not present

## 2019-09-29 DIAGNOSIS — R7303 Prediabetes: Secondary | ICD-10-CM | POA: Diagnosis not present

## 2019-09-29 DIAGNOSIS — Z1211 Encounter for screening for malignant neoplasm of colon: Secondary | ICD-10-CM

## 2019-09-29 DIAGNOSIS — Z23 Encounter for immunization: Secondary | ICD-10-CM | POA: Diagnosis not present

## 2019-09-29 DIAGNOSIS — I5043 Acute on chronic combined systolic (congestive) and diastolic (congestive) heart failure: Secondary | ICD-10-CM

## 2019-09-29 DIAGNOSIS — M1A0720 Idiopathic chronic gout, left ankle and foot, without tophus (tophi): Secondary | ICD-10-CM

## 2019-09-29 DIAGNOSIS — M1A0620 Idiopathic chronic gout, left knee, without tophus (tophi): Secondary | ICD-10-CM

## 2019-09-29 DIAGNOSIS — I10 Essential (primary) hypertension: Secondary | ICD-10-CM

## 2019-09-29 LAB — POCT GLYCOSYLATED HEMOGLOBIN (HGB A1C): Hemoglobin A1C: 5.8 % — AB (ref 4.0–5.6)

## 2019-09-29 MED ORDER — FUROSEMIDE 80 MG PO TABS: 80.0000 mg | ORAL_TABLET | Freq: Two times a day (BID) | ORAL | 3 refills | Status: DC

## 2019-09-29 MED ORDER — MITIGARE 0.6 MG PO CAPS: 2.0000 | ORAL_CAPSULE | Freq: Every day | ORAL | 2 refills | Status: DC

## 2019-09-29 MED ORDER — POTASSIUM CHLORIDE CRYS ER 10 MEQ PO TBCR: 10.0000 meq | EXTENDED_RELEASE_TABLET | Freq: Every day | ORAL | 3 refills | Status: DC

## 2019-09-29 MED ORDER — ALLOPURINOL 100 MG PO TABS: 50.0000 mg | ORAL_TABLET | Freq: Every day | ORAL | 3 refills | Status: DC

## 2019-09-29 NOTE — Progress Notes (Signed)
Subjective:    Chad [name]. - 58 y.o. male MRN [id number]  Date of birth: 1961/09/18  CC:  [name]. is here for medication refills and prediabetes f/u.  HPI: Medication refills for gout [name] reports that he has had gout in his bilateral great toes as well as in his bilateral knees.  He takes colchicine once daily when he has a gout flare, which helps some but he does not feel like this dose is adequate.  He is very concerned about taking any medication that will endanger his 1 remaining kidney.  He has never taken allopurinol before.  He cannot identify any other alleviating factors other than colchicine and denies any exacerbating factors.  He does have swelling in his joints but denies erythema.  He says that his feet are not swollen currently.  Prediabetes Hemoglobin A1c was 6.2 in December 2018, which was his last check.  He does not take metformin.  Health Maintenance:  -Due for colonoscopy and flu vaccine today.  He is amenable to both of these. Health Maintenance Due  Topic Date Due  . COLONOSCOPY  12/31/2012  . INFLUENZA VACCINE  07/26/2019    -  reports that he has never smoked. He has never used smokeless tobacco. - Review of Systems: Per HPI. - Past Medical History: Patient Active Problem List   Diagnosis Date Noted  . S/P laparoscopic cholecystectomy 12/09/2018  . Colicky RUQ abdominal pain 11/30/2018  . Pain in joint of left shoulder 07/05/2018  . 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 Hip region mass, left 06/24/2018  . 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 Inguinal hernia 02/18/2018  . Prediabetes 12/20/2017  . Renal insufficiency   . Hypokalemia   . CHF exacerbation ([place]) 12/15/2017  . Failure of implantable cardioverter-defibrillator (ICD) lead 11/07/2016  . Dyspnea 05/12/2016  . Erectile dysfunction 04/18/2016  . Pain in the chest   . Aneurysm of  descending thoracic aorta ([place])   . Nasal folliculitis [id number]  . Chronic systolic CHF (congestive heart failure) ([place]) 04/12/2015  . CKD (chronic kidney disease) stage 3, GFR 30-59 ml/min (HCC) 04/12/2015  . Unstable angina ([place]) 01/25/2015  . Chest pain 10/10/2014  . Health care maintenance 03/19/2014  . Local reaction to tetanus vaccine 03/19/2014  . Neck pain on left side 06/03/2013  . Penile discharge 02/26/2013  . Syncope 01/17/2013  . Chronic combined systolic and diastolic CHF (congestive heart failure) ([place]) 01/16/2013  . Low back pain 04/23/2012  . Chest pain syndrome   . Insomnia 09/14/2011  . 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 Coronary atherosclerosis of native coronary artery 06/12/2011  . Bradycardia 05/03/2011  . Aortic dissection, thoracoabdominal ([place])   . Chronic kidney disease (CKD), stage II (mild)   . ORTHOSTATIC HYPOTENSION 05/03/2010  . CAROTID BRUIT 01/25/2010  . PANCYTOPENIA 11/01/2009  . Fatigue 10/25/2009  . DISSECTING AORTIC ANEURYSM THORACOABDOMINAL 07/14/2009  . SCIATICA, RIGHT 05/18/2009  . Hyperlipidemia 09/29/2008  . HYPERTENSION, BENIGN 09/29/2008  . CAD, ARTERY BYPASS GRAFT 09/29/2008  . SYSTOLIC HEART FAILURE, CHRONIC 09/29/2008  . ICD - IN SITU 09/29/2008   - Medications: reviewed and updated   Objective:   Physical Exam BP 110/68   Pulse 73   Wt 173 lb (78.5 kg)   SpO2 97%   BMI 24.13 kg/m  Gen: NAD, alert, cooperative with exam, well-appearing Extremities: No deformity noted in  the bilateral feet or knees.  5/5 strength on dorsiflexion, plantarflexion, eversion, and inversion bilaterally.  Normal strength on leg extension.  Neurovascularly intact.  Normal gait. Psych: good insight, alert and oriented        Assessment & Plan:   Prediabetes Hemoglobin A1c is 5.8 today, improved from 6.2 in 2018.  Patient needs no further medications.  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,  bilateral Given patient's history and improvement with colchicine, patient likely does have gout based on his symptoms.  Patient's creatinine clearance is 58, so colchicine dosing does not need to be altered.  Therefore, he can take 0.6 mg twice daily during gout flares after taking 1.8 mg on the first day of a flare.  Since his gout appears to be chronic, he would also benefit from starting allopurinol 50 mg daily for preventative measures, which is dose adjusted for his CKD.  Patient was told that we can always do a short course of prednisone if colchicine fails to alleviate his symptoms.  New prescription of colchicine and allopurinol sent to pharmacy.  Health care maintenance Flu shot given today.  Order placed for colonoscopy, patient will call to make appointment.    Maia Breslow, M.D. 09/29/2019, 4:39 PM PGY-3, [place]

## 2019-09-29 NOTE — Patient Instructions (Addendum)
It was nice seeing you today Mr. Sandefer!  For your gout, you can take Mitigare (also called colchicine) twice daily during flares.  I have increased your prescription to reflect this.  Please also start allopurinol 1/2 tablet/day, which you should take every day whether you have flares or not.  Please call me if your gout does not improve, and we can always give you a short course of prednisone if needed.  Please make an appointment to get your colonoscopy done whenever is convenient for you.  You can follow-up with me in about 1 year or earlier if needed.  If you have any questions or concerns, please feel free to call the clinic.   Be well,  Dr. Shan Levans

## 2019-09-29 NOTE — Assessment & Plan Note (Addendum)
Hemoglobin A1c is 5.8 today, improved from 6.2 in 2018.  Patient needs no further medications.

## 2019-09-29 NOTE — Assessment & Plan Note (Signed)
Given patient's history and improvement with colchicine, patient likely does have gout based on his symptoms.  Patient's creatinine clearance is 58, so colchicine dosing does not need to be altered.  Therefore, he can take 0.6 mg twice daily during gout flares after taking 1.8 mg on the first day of a flare.  Since his gout appears to be chronic, he would also benefit from starting allopurinol 50 mg daily for preventative measures, which is dose adjusted for his CKD.  Patient was told that we can always do a short course of prednisone if colchicine fails to alleviate his symptoms.  New prescription of colchicine and allopurinol sent to pharmacy.

## 2019-09-29 NOTE — Assessment & Plan Note (Signed)
Flu shot given today.  Order placed for colonoscopy, patient will call to make appointment.

## 2019-09-30 ENCOUNTER — Encounter: Payer: Self-pay | Admitting: Physician Assistant

## 2019-10-08 ENCOUNTER — Ambulatory Visit: Payer: Medicaid Other | Admitting: Physician Assistant

## 2019-10-22 DIAGNOSIS — M79661 Pain in right lower leg: Secondary | ICD-10-CM | POA: Diagnosis not present

## 2019-10-22 DIAGNOSIS — M79662 Pain in left lower leg: Secondary | ICD-10-CM | POA: Diagnosis not present

## 2019-10-22 DIAGNOSIS — G894 Chronic pain syndrome: Secondary | ICD-10-CM | POA: Diagnosis not present

## 2019-10-22 DIAGNOSIS — M542 Cervicalgia: Secondary | ICD-10-CM | POA: Diagnosis not present

## 2019-11-09 ENCOUNTER — Other Ambulatory Visit (HOSPITAL_COMMUNITY): Payer: Self-pay | Admitting: Internal Medicine

## 2019-11-10 ENCOUNTER — Ambulatory Visit (INDEPENDENT_AMBULATORY_CARE_PROVIDER_SITE_OTHER): Payer: Medicaid Other | Admitting: *Deleted

## 2019-11-10 ENCOUNTER — Other Ambulatory Visit (HOSPITAL_COMMUNITY): Payer: Self-pay

## 2019-11-10 DIAGNOSIS — R55 Syncope and collapse: Secondary | ICD-10-CM | POA: Diagnosis not present

## 2019-11-10 DIAGNOSIS — I5022 Chronic systolic (congestive) heart failure: Secondary | ICD-10-CM

## 2019-11-10 MED ORDER — NITROGLYCERIN 0.4 MG SL SUBL: SUBLINGUAL_TABLET | SUBLINGUAL | 0 refills | Status: DC

## 2019-11-11 LAB — CUP PACEART REMOTE DEVICE CHECK
Battery Remaining Longevity: 78 mo
Battery Remaining Percentage: 82 %
Brady Statistic RV Percent Paced: 0 %
Date Time Interrogation Session: 20201116082100
HighPow Impedance: 50 Ohm
Implantable Lead Implant Date: 20171114
Implantable Lead Location: 753860
Implantable Lead Model: 181
Implantable Lead Serial Number: 333496
Implantable Pulse Generator Implant Date: 20131220
Lead Channel Impedance Value: 456 Ohm
Lead Channel Pacing Threshold Amplitude: 0.8 V
Lead Channel Pacing Threshold Pulse Width: 0.4 ms
Lead Channel Setting Pacing Amplitude: 3.5 V
Lead Channel Setting Pacing Pulse Width: 0.4 ms
Lead Channel Setting Sensing Sensitivity: 0.5 mV
Pulse Gen Serial Number: 124654

## 2019-11-19 DIAGNOSIS — G894 Chronic pain syndrome: Secondary | ICD-10-CM | POA: Diagnosis not present

## 2019-11-19 DIAGNOSIS — M79661 Pain in right lower leg: Secondary | ICD-10-CM | POA: Diagnosis not present

## 2019-11-19 DIAGNOSIS — M79662 Pain in left lower leg: Secondary | ICD-10-CM | POA: Diagnosis not present

## 2019-11-19 DIAGNOSIS — M542 Cervicalgia: Secondary | ICD-10-CM | POA: Diagnosis not present

## 2019-11-28 ENCOUNTER — Other Ambulatory Visit (HOSPITAL_COMMUNITY): Payer: Self-pay | Admitting: Internal Medicine

## 2019-12-01 ENCOUNTER — Other Ambulatory Visit (HOSPITAL_COMMUNITY): Payer: Self-pay

## 2019-12-01 MED ORDER — FUROSEMIDE 80 MG PO TABS: 80.0000 mg | ORAL_TABLET | Freq: Two times a day (BID) | ORAL | 0 refills | Status: DC

## 2019-12-05 NOTE — Progress Notes (Signed)
Remote ICD transmission.   

## 2019-12-08 ENCOUNTER — Ambulatory Visit (HOSPITAL_COMMUNITY)
Admission: RE | Admit: 2019-12-08 | Discharge: 2019-12-08 | Disposition: A | Discharge status: Home / Self Care | Payer: Medicaid Other | Source: Ambulatory Visit | Attending: Internal Medicine | Admitting: Internal Medicine

## 2019-12-08 ENCOUNTER — Other Ambulatory Visit: Payer: Self-pay

## 2019-12-08 VITALS — HR 73 | Wt 168.0 lb

## 2019-12-08 DIAGNOSIS — Z79899 Other long term (current) drug therapy: Secondary | ICD-10-CM | POA: Diagnosis not present

## 2019-12-08 DIAGNOSIS — I5042 Chronic combined systolic (congestive) and diastolic (congestive) heart failure: Secondary | ICD-10-CM | POA: Insufficient documentation

## 2019-12-08 LAB — BASIC METABOLIC PANEL
Anion gap: 11 (ref 5–15)
BUN: 24 mg/dL — ABNORMAL HIGH (ref 6–20)
CO2: 29 mmol/L (ref 22–32)
Calcium: 8.6 mg/dL — ABNORMAL LOW (ref 8.9–10.3)
Chloride: 105 mmol/L (ref 98–111)
Creatinine, Ser: 1.7 mg/dL — ABNORMAL HIGH (ref 0.61–1.24)
GFR calc Af Amer: 51 mL/min — ABNORMAL LOW (ref >60)
GFR calc non Af Amer: 44 mL/min — ABNORMAL LOW (ref >60)
Glucose, Bld: 88 mg/dL (ref 70–99)
Potassium: 3.9 mmol/L (ref 3.5–5.1)
Sodium: 145 mmol/L (ref 135–145)

## 2019-12-09 NOTE — Progress Notes (Signed)
Pt was seen for nurse visit because he called the triage line to report fluid retention.  Pt denies shortness of breath, edema, and states his weight is stable. Patient said for the last 4-5 days he has been taking up to 6 tablets a day of Torsemide 80mg  tablets. Per Amy pt needed a nurse visit for labs (bmet) and a clip reading. Patients clip was 38% and his creatinine was elevated due to extra torsemide pt has taken. Per Amy "Please call and instruct him to take lasix 80 mg twice a day. Repeat BMET next week. " pt aware of results and verbalized understanding. Lab appointment scheduled.

## 2019-12-10 ENCOUNTER — Other Ambulatory Visit (HOSPITAL_COMMUNITY): Payer: Self-pay | Admitting: Internal Medicine

## 2019-12-16 ENCOUNTER — Emergency Department (HOSPITAL_COMMUNITY)
Admission: EM | Admit: 2019-12-16 | Discharge: 2019-12-16 | Disposition: A | Discharge status: Home / Self Care | Payer: Medicaid Other | Attending: Emergency Medicine | Admitting: Emergency Medicine

## 2019-12-16 ENCOUNTER — Other Ambulatory Visit: Payer: Self-pay

## 2019-12-16 ENCOUNTER — Emergency Department (HOSPITAL_COMMUNITY): Payer: Medicaid Other

## 2019-12-16 DIAGNOSIS — Z951 Presence of aortocoronary bypass graft: Secondary | ICD-10-CM | POA: Diagnosis not present

## 2019-12-16 DIAGNOSIS — I130 Hypertensive heart and chronic kidney disease with heart failure and stage 1 through stage 4 chronic kidney disease, or unspecified chronic kidney disease: Secondary | ICD-10-CM | POA: Insufficient documentation

## 2019-12-16 DIAGNOSIS — Z85850 Personal history of malignant neoplasm of thyroid: Secondary | ICD-10-CM | POA: Diagnosis not present

## 2019-12-16 DIAGNOSIS — I5023 Acute on chronic systolic (congestive) heart failure: Secondary | ICD-10-CM | POA: Diagnosis not present

## 2019-12-16 DIAGNOSIS — I252 Old myocardial infarction: Secondary | ICD-10-CM | POA: Diagnosis not present

## 2019-12-16 DIAGNOSIS — R0689 Other abnormalities of breathing: Secondary | ICD-10-CM | POA: Diagnosis not present

## 2019-12-16 DIAGNOSIS — I509 Heart failure, unspecified: Secondary | ICD-10-CM | POA: Diagnosis not present

## 2019-12-16 DIAGNOSIS — I2510 Atherosclerotic heart disease of native coronary artery without angina pectoris: Secondary | ICD-10-CM | POA: Insufficient documentation

## 2019-12-16 DIAGNOSIS — Z7901 Long term (current) use of anticoagulants: Secondary | ICD-10-CM | POA: Insufficient documentation

## 2019-12-16 DIAGNOSIS — Z95811 Presence of heart assist device: Secondary | ICD-10-CM | POA: Insufficient documentation

## 2019-12-16 DIAGNOSIS — R5383 Other fatigue: Secondary | ICD-10-CM | POA: Insufficient documentation

## 2019-12-16 DIAGNOSIS — R0602 Shortness of breath: Secondary | ICD-10-CM | POA: Diagnosis not present

## 2019-12-16 DIAGNOSIS — Z79899 Other long term (current) drug therapy: Secondary | ICD-10-CM | POA: Diagnosis not present

## 2019-12-16 DIAGNOSIS — Z7982 Long term (current) use of aspirin: Secondary | ICD-10-CM | POA: Diagnosis not present

## 2019-12-16 DIAGNOSIS — I491 Atrial premature depolarization: Secondary | ICD-10-CM | POA: Diagnosis not present

## 2019-12-16 DIAGNOSIS — Z91040 Latex allergy status: Secondary | ICD-10-CM | POA: Diagnosis not present

## 2019-12-16 DIAGNOSIS — R0789 Other chest pain: Secondary | ICD-10-CM | POA: Insufficient documentation

## 2019-12-16 DIAGNOSIS — N183 Chronic kidney disease, stage 3 unspecified: Secondary | ICD-10-CM | POA: Insufficient documentation

## 2019-12-16 DIAGNOSIS — R079 Chest pain, unspecified: Secondary | ICD-10-CM | POA: Diagnosis not present

## 2019-12-16 DIAGNOSIS — I110 Hypertensive heart disease with heart failure: Secondary | ICD-10-CM | POA: Diagnosis not present

## 2019-12-16 LAB — BASIC METABOLIC PANEL
Anion gap: 9 (ref 5–15)
BUN: 22 mg/dL — ABNORMAL HIGH (ref 6–20)
CO2: 27 mmol/L (ref 22–32)
Calcium: 8.3 mg/dL — ABNORMAL LOW (ref 8.9–10.3)
Chloride: 104 mmol/L (ref 98–111)
Creatinine, Ser: 1.81 mg/dL — ABNORMAL HIGH (ref 0.61–1.24)
GFR calc Af Amer: 47 mL/min — ABNORMAL LOW (ref >60)
GFR calc non Af Amer: 41 mL/min — ABNORMAL LOW (ref >60)
Glucose, Bld: 123 mg/dL — ABNORMAL HIGH (ref 70–99)
Potassium: 3.8 mmol/L (ref 3.5–5.1)
Sodium: 140 mmol/L (ref 135–145)

## 2019-12-16 LAB — CBC
HCT: 39.6 % (ref 39.0–52.0)
Hemoglobin: 12.5 g/dL — ABNORMAL LOW (ref 13.0–17.0)
MCH: 27.5 pg (ref 26.0–34.0)
MCHC: 31.6 g/dL (ref 30.0–36.0)
MCV: 87.2 fL (ref 80.0–100.0)
Platelets: 106 10*3/uL — ABNORMAL LOW (ref 150–400)
RBC: 4.54 MIL/uL (ref 4.22–5.81)
RDW: 14.5 % (ref 11.5–15.5)
WBC: 3.3 10*3/uL — ABNORMAL LOW (ref 4.0–10.5)
nRBC: 0 % (ref 0.0–0.2)

## 2019-12-16 LAB — BRAIN NATRIURETIC PEPTIDE: B Natriuretic Peptide: 3791.5 pg/mL — ABNORMAL HIGH (ref 0.0–100.0)

## 2019-12-16 LAB — TROPONIN I (HIGH SENSITIVITY)
Troponin I (High Sensitivity): 16 ng/L (ref <18)
Troponin I (High Sensitivity): 14 ng/L (ref <18)

## 2019-12-16 MED ORDER — FUROSEMIDE 10 MG/ML IJ SOLN
80.0000 mg | Freq: Once | INTRAMUSCULAR | Status: AC
  Administered 2019-12-16: 80 mg via INTRAVENOUS
  Filled 2019-12-16: qty 8

## 2019-12-16 MED ORDER — SODIUM CHLORIDE 0.9% FLUSH
3.0000 mL | Freq: Once | INTRAVENOUS | Status: AC
  Administered 2019-12-16: 3 mL via INTRAVENOUS

## 2019-12-16 MED ORDER — POTASSIUM CHLORIDE CRYS ER 20 MEQ PO TBCR
20.0000 meq | EXTENDED_RELEASE_TABLET | Freq: Once | ORAL | Status: AC
  Administered 2019-12-16: 20 meq via ORAL
  Filled 2019-12-16: qty 1

## 2019-12-16 MED ORDER — POTASSIUM CHLORIDE CRYS ER 20 MEQ PO TBCR: 20.0000 meq | EXTENDED_RELEASE_TABLET | Freq: Two times a day (BID) | ORAL | 0 refills | Status: DC

## 2019-12-16 NOTE — ED Provider Notes (Signed)
[facility] EMERGENCY DEPARTMENT Provider Note   CSN: [id number] Arrival date & time: 12/16/19  1508     History Chief Complaint  Patient presents with  . Shortness of Breath  . Chest Pain    Chad [name]. is a 58 y.o. male.  Patient presents today with complaint of chest discomfort x 2 days, feels "heavy" today.  Patient with increasing dyspnea with exertion. He states that his blood pressure has been very labile over the last few days. Shortness of breath increases with increased blood pressure. He has a history of CHF, usually takes 80 mg lasix bid. No lasix today.   The history is provided by the patient and medical records.  Shortness of Breath Severity:  Moderate Duration:  2 days Timing:  Intermittent Progression:  Worsening Chronicity:  Recurrent Context: activity   Ineffective treatments:  Diuretics Associated symptoms: chest pain   Chest Pain Associated symptoms: fatigue and shortness of breath        Past Medical History:  Diagnosis Date  . AICD (automatic cardioverter/defibrillator) present   . Anemia   . Anginal pain ([place])   . Anxiety   . Aortic dissection, thoracoabdominal ([place])    7/10: Type I s/p repair  . Arthritis   . Asthma   . CAD (coronary artery disease)    a. s/p CABG 2006;  b. DES to PDA 2011 (cath: Dx not seen, dRCA/PDA tx with DES; S-PDA occluded (culprit), S-Dx occluded, S-RI and OM ok, L-LAD ok  . Carotid stenosis    dopplers 2011: 0-39% bilat.  . Chest pain syndrome   . CHF (congestive heart failure) ([place])   . Chronic bronchitis ([place])   . Chronic lower back pain   . Chronic systolic heart failure (HCC)    a. 12/13 ECHO: EF 35-40%, sept, apical & posterobasal HK, LV mod dil & sys fx mod reduced, mild AI, MV mild reg, TV mild reg  . Complication of anesthesia    "difficult to wake afterwards a couple of times" and patient states he has woken during surgery   . CRI (chronic renal insufficiency)    "one  kidney is gone; the other is hanging on" (11/07/2016)  . Dyspnea    patient denies at 12/06/2018 appt   . Family history of adverse reaction to anesthesia    "sister hard to wake up"  . GERD (gastroesophageal reflux disease)   . Gout   . Heart murmur   . HLD (hyperlipidemia)   . HTN (hypertension)    severe  . Myocardial infarction [facility])    "many" (11/07/2016)  . PONV (postoperative nausea and vomiting)   . Sleep apnea    does not use mask   . Thyroid cancer ([place])    [employer]    Patient Active Problem List   Diagnosis Date Noted  . S/P laparoscopic cholecystectomy 12/09/2018  . Colicky RUQ abdominal pain 11/30/2018  . Pain in joint of left shoulder 07/05/2018  . 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 Hip region mass, left 06/24/2018  . 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 Inguinal hernia 02/18/2018  . Prediabetes 12/20/2017  . Renal insufficiency   . Hypokalemia   . CHF exacerbation ([place]) 12/15/2017  . Failure of implantable cardioverter-defibrillator (ICD) lead 11/07/2016  . Dyspnea 05/12/2016  . Erectile dysfunction 04/18/2016  . Pain in the chest   . Aneurysm of descending thoracic aorta (  [place])   . Nasal folliculitis Q000111Q  . Chronic systolic CHF (congestive heart failure) ([place]) 04/12/2015  . CKD (chronic kidney disease) stage 3, GFR 30-59 ml/min (HCC) 04/12/2015  . Unstable angina ([place]) 01/25/2015  . Chest pain 10/10/2014  . Health care maintenance 03/19/2014  . Local reaction to tetanus vaccine 03/19/2014  . Neck pain on left side 06/03/2013  . Penile discharge 02/26/2013  . Syncope 01/17/2013  . Chronic combined systolic and diastolic CHF (congestive heart failure) ([place]) 01/16/2013  . Low back pain 04/23/2012  . Chest pain syndrome   . Insomnia 09/14/2011  . 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 Coronary atherosclerosis of native  coronary artery 06/12/2011  . Bradycardia 05/03/2011  . Aortic dissection, thoracoabdominal ([place])   . Chronic kidney disease (CKD), stage II (mild)   . ORTHOSTATIC HYPOTENSION 05/03/2010  . CAROTID BRUIT 01/25/2010  . PANCYTOPENIA 11/01/2009  . Fatigue 10/25/2009  . DISSECTING AORTIC ANEURYSM THORACOABDOMINAL 07/14/2009  . SCIATICA, RIGHT 05/18/2009  . Hyperlipidemia 09/29/2008  . HYPERTENSION, BENIGN 09/29/2008  . CAD, ARTERY BYPASS GRAFT 09/29/2008  . SYSTOLIC HEART FAILURE, CHRONIC 09/29/2008  . ICD - IN SITU 09/29/2008    Past Surgical History:  Procedure Laterality Date  . BACK SURGERY    . CARDIAC CATHETERIZATION     "several" (11/07/2016)  . CARDIAC DEFIBRILLATOR PLACEMENT  11/2005   [employer]; Archie Endo 05/09/2011  . CHOLECYSTECTOMY N/A 12/09/2018   Procedure: LAPAROSCOPIC CHOLECYSTECTOMY;  Surgeon: Ralene Ok, MD;  Location: [facility];  Service: General;  Laterality: N/A;  . CORONARY ANGIOPLASTY WITH STENT PLACEMENT     "I've had 1-2 stents" (11/07/2016)  . CORONARY ARTERY BYPASS GRAFT  06/21/2009   "CABG X2"  . CORONARY ARTERY BYPASS GRAFT  06/29/2005   "CABG X7"  . ICD LEAD REMOVAL  11/07/2016  . ICD LEAD REMOVAL N/A 11/07/2016   Procedure: ICD LEAD REMOVAL, INSERTION OF NEW ICD LEAD;  Surgeon: Evans Lance, MD;  Location: [facility];  Service: Cardiovascular;  Laterality: N/A;  Dr. Prescott Gum to backup case  . IMPLANTABLE CARDIOVERTER DEFIBRILLATOR (ICD) GENERATOR CHANGE N/A 12/13/2012   Procedure: ICD GENERATOR CHANGE;  Surgeon: Evans Lance, MD;  Location: [facility] CATH LAB;  Service: Cardiovascular;  Laterality: N/A;  . INGUINAL HERNIA REPAIR Bilateral 06/25/2018   Procedure: LAPAROSCOPIC  BILATERAL INGUINAL HERNIA REPAIRS;  Surgeon: Ralene Ok, MD;  Location: [facility];  Service: General;  Laterality: Bilateral;  . INSERTION OF MESH Bilateral 06/25/2018   Procedure: INSERTION OF MESH;  Surgeon: Ralene Ok, MD;  Location: [facility];  Service: General;   Laterality: Bilateral;  . [place] SURGERY  01/2001    most recent within 5-10 years  . RIGHT HEART CATH N/A 04/05/2018   Procedure: RIGHT HEART CATH;  Surgeon: Jolaine Artist, MD;  Location: [facility] CV LAB;  Service: Cardiovascular;  Laterality: N/A;  . SHOULDER ARTHROSCOPY WITH ROTATOR CUFF REPAIR Bilateral   . Status post emergency repair of a type A ascending aortic dissection with a hemiarch reconstruction of the ascending aorta  using a 28-mm Hemashield graft with redo sternotomy and revision of previous bypass grafts in June 2010.    . TESTICLE SURGERY    . THYROIDECTOMY, PARTIAL  06/20/2011  . TONSILLECTOMY    . UMBILICAL HERNIA REPAIR N/A 06/25/2018   Procedure: UMBILICAL HERNIA REPAIR;  Surgeon: Ralene Ok, MD;  Location: [facility];  Service: General;  Laterality: N/A;  . VASECTOMY  Family History  Problem Relation Age of Onset  . Hypertension Father   . Heart disease Father   . Early death Father   . COPD Father   . Hypertension Mother   . Coronary artery disease Other     Social History   Tobacco Use  . Smoking status: Never Smoker  . Smokeless tobacco: Never Used  Substance Use Topics  . Alcohol use: No  . Drug use: No    Home Medications Prior to Admission medications   Medication Sig Start Date End Date Taking? Authorizing Provider  albuterol (PROVENTIL HFA;VENTOLIN HFA) 108 (90 Base) MCG/ACT inhaler Inhale 2 puffs into the lungs every 6 (six) hours as needed for wheezing or shortness of breath. 10/19/17   Mikell, Jeani Sow, MD  Alirocumab (PRALUENT) 75 MG/ML SOAJ Inject 1 pen into the skin every 14 (fourteen) days. 09/12/19   Evans Lance, MD  allopurinol (ZYLOPRIM) 100 MG tablet Take 0.5 tablets (50 mg total) by mouth daily. 09/29/19   Kathrene Alu, MD  aspirin EC 81 MG tablet Take 1 tablet (81 mg total) by mouth daily. Can take 4 tabs (324 mg total) as needed for chest pain. DO NOT TAKE multiple 325 mg tabs. 05/01/18   Shirley Friar, PA-C  B Complex-C (SUPER B COMPLEX PO) Take 3 tablets by mouth daily.     [provider]  carvedilol (COREG) 25 MG tablet TAKE 1 TABLET BY MOUTH TWICE DAILY WITH MEALS 12/10/19   Bensimhon, Shaune Pascal, MD  clopidogrel (PLAVIX) 75 MG tablet Take 1 tablet (75 mg total) by mouth daily. 10/10/18   Bensimhon, Shaune Pascal, MD  cyclobenzaprine (FLEXERIL) 10 MG tablet Take 10 mg by mouth 3 (three) times daily as needed for muscle spasms.    [provider]  diazepam (VALIUM) 5 MG tablet Take 1 tablet (5 mg total) by mouth 2 (two) times daily. 03/22/19   Isla Pence, MD  doxazosin (CARDURA) 1 MG tablet Take 1 tablet (1 mg total) by mouth every 12 (twelve) hours as needed (for systolic BP > 0000000 mm Hg.). 10/10/18   Bensimhon, Shaune Pascal, MD  fluticasone (FLONASE) 50 MCG/ACT nasal spray Place 2 sprays into both nostrils daily as needed for allergies or rhinitis.     [provider]  furosemide (LASIX) 80 MG tablet Take 1 tablet (80 mg total) by mouth 2 (two) times daily. 12/01/19   Larey Dresser, MD  hydrALAZINE (APRESOLINE) 50 MG tablet TAKE 1 & 1/2 (ONE & ONE-HALF) TABLETS BY MOUTH TWICE DAILY 09/09/19   Bensimhon, Shaune Pascal, MD  ipratropium-albuterol (DUONEB) 0.5-2.5 (3) MG/3ML SOLN Take 3 mLs by nebulization every 6 (six) hours as needed. Patient taking differently: Take 3 mLs by nebulization every 6 (six) hours as needed (SOB, wheezing).  12/20/17   Lovenia Kim, MD  MITIGARE 0.6 MG CAPS Take 2 capsules by mouth daily. 09/29/19   Kathrene Alu, MD  nitroGLYCERIN (NITROSTAT) 0.4 MG SL tablet DISSOLVE ONE TABLET UNDER THE TONGUE EVERY 5 MINUTES FOR 3 DOSES AS NEEDED FOR CHEST PAIN. 11/10/19   Bensimhon, Shaune Pascal, MD  Oxycodone HCl 10 MG TABS Take 20 mg by mouth every 8 (eight) hours.     [provider]  potassium chloride (KLOR-CON M10) 10 MEQ tablet Take 1 tablet (10 mEq total) by mouth daily. 09/29/19   Larey Dresser, MD  sacubitril-valsartan  (ENTRESTO) 97-103 MG Take 1 tablet by mouth 2 (two) times daily. 10/10/18   Bensimhon, Quillian Quince  R, MD  sildenafil (VIAGRA) 100 MG tablet TAKE 1/2 (ONE-HALF) TABLET BY MOUTH ONCE DAILY AS NEEDED FOR ERECTILE DYSFUNCTION 05/21/19   Bensimhon, Shaune Pascal, MD  spironolactone (ALDACTONE) 25 MG tablet Take 0.5 tablets (12.5 mg total) by mouth daily. 10/10/18   Bensimhon, Shaune Pascal, MD  ursodiol (ACTIGALL) 500 MG tablet Take 1 tablet by mouth twice daily 03/20/19   Nuala Alpha, DO    Allergies    Contrast media [iodinated diagnostic agents], Iohexol, Lipitor [atorvastatin calcium], Shellfish allergy, Sulfa antibiotics, Sulfonamide derivatives, Almond oil, Metrizamide, Zocor [simvastatin], Latex, and Peach [prunus persica]  Review of Systems   Review of Systems  Constitutional: Positive for fatigue.  Respiratory: Positive for shortness of breath.   Cardiovascular: Positive for chest pain.  All other systems reviewed and are negative.   Physical Exam Updated Vital Signs BP (!) 116/92   Pulse 62   Temp 98.1 F (36.7 C) (Oral)   Resp 11   SpO2 99%   Physical Exam Vitals and nursing note reviewed.  Constitutional:      Appearance: He is well-developed.  HENT:     Head: Normocephalic.     Mouth/Throat:     Mouth: Mucous membranes are moist.  Eyes:     Conjunctiva/sclera: Conjunctivae normal.  Cardiovascular:     Rate and Rhythm: Normal rate and regular rhythm.  Pulmonary:     Effort: Pulmonary effort is normal.     Breath sounds: Examination of the right-lower field reveals rales. Examination of the left-lower field reveals rales. Rales present.  Abdominal:     General: There is distension.     Palpations: Abdomen is soft.  Musculoskeletal:     Right lower leg: No edema.     Left lower leg: No edema.  Skin:    General: Skin is warm and dry.  Neurological:     Mental Status: He is alert and oriented to person, place, and time.  Psychiatric:        Mood and Affect: Mood normal.      ED Results / Procedures / Treatments   Labs (all labs ordered are listed, but only abnormal results are displayed) Labs Reviewed  BASIC METABOLIC PANEL - Abnormal; Notable for the following components:      Result Value   Glucose, Bld 123 (*)    BUN 22 (*)    Creatinine, Ser 1.81 (*)    Calcium 8.3 (*)    GFR calc non Af Amer 41 (*)    GFR calc Af Amer 47 (*)    All other components within normal limits  CBC - Abnormal; Notable for the following components:   WBC 3.3 (*)    Hemoglobin 12.5 (*)    Platelets 106 (*)    All other components within normal limits  BRAIN NATRIURETIC PEPTIDE - Abnormal; Notable for the following components:   B Natriuretic Peptide 3,791.5 (*)    All other components within normal limits  TROPONIN I (HIGH SENSITIVITY)  TROPONIN I (HIGH SENSITIVITY)    EKG EKG Interpretation  Date/Time:  Tuesday December 16 2019 15:12:44 EST Ventricular Rate:  71 PR Interval:  176 QRS Duration: 118 QT Interval:  420 QTC Calculation: 456 R Axis:   25 Text Interpretation: Sinus rhythm with occasional Premature ventricular complexes Biatrial enlargement ST & T wave abnormality, consider inferolateral ischemia Abnormal ECG since last tracing no significant change Confirmed by Daleen Bo [id number]) on 12/16/2019 4:09:12 PM   Radiology DG Chest 2 View  Result Date:  12/16/2019 CLINICAL DATA:  Short of breath.  Chest pain for 3 days. EXAM: CHEST - 2 VIEW COMPARISON:  Chest radiograph, 11/27/2018.  Chest CT, 10/11/2017. FINDINGS: There are changes from prior CABG surgery, stable. Cardiac silhouette is mild to moderately enlarged, unchanged. The aortic arch is dilated reflecting the thoracic aneurysm noted on the prior chest CT. It is stable from the most recent prior chest radiograph. No mediastinal or hilar masses.  No evidence of adenopathy. Left anterior chest wall ICD is stable. Mild reticular type opacities are noted at the left lung base, most likely atelectasis.  Lungs are mildly hyperexpanded, but otherwise clear. No pleural effusion or pneumothorax. Skeletal structures are intact. IMPRESSION: 1. No acute cardiopulmonary disease. 2. Cardiomegaly and thoracic aortic aneurysm. Electronically Signed   By: Lajean Manes M.D.   On: 12/16/2019 15:58    Procedures Procedures (including critical care time)  Medications Ordered in ED Medications  sodium chloride flush (NS) 0.9 % injection 3 mL (3 mLs Intravenous Given 12/16/19 1819)  furosemide (LASIX) injection 80 mg (80 mg Intravenous Given 12/16/19 1819)  potassium chloride SA (KLOR-CON) CR tablet 20 mEq (20 mEq Oral Given 12/16/19 2149)    ED Course  I have reviewed the triage vital signs and the nursing notes.  Pertinent labs & imaging results that were available during my care of the patient were reviewed by me and considered in my medical decision making (see chart for details).    MDM Rules/Calculators/A&P                      Patient presents to ED with CHF exacerbation. Mild pitting edema of lower extremities. Abdomen somewhat distended. Mild crackles at the lung bases. BNP 3790. Patient given 80 lasix IV in the ED. Diuresed well, shortness of breath improved.  Patient is NYHA class 3 at baseline.  Discussed patient with Dr. Haroldine Laws. Patient will be enrolled in [facility] HF trial.  Patient will be discharged home with subcutaneous lasix. Research coordinator has discussed administration procedure. He will receive two doses on Wednesday and one on Thursday. Potassium supplementation provided (20 meq bid for the next two days). The heart failure clinic will be in touch with the patient tomorrow.  Patient appears safe for discharge at this time. Return precautions discussed and provided in discharge paperwork.    Final Clinical Impression(s) / ED Diagnoses Final diagnoses:  Acute on chronic congestive heart failure, unspecified heart failure type ([place])    Rx / DC Orders ED Discharge  Orders         Ordered    potassium chloride SA (KLOR-CON) 20 MEQ tablet  2 times daily     12/16/19 2117           Etta Quill, NP 12/16/19 2300    Daleen Bo, MD 12/19/19 1601

## 2019-12-16 NOTE — ED Triage Notes (Signed)
Pt here from home, chest discomfort x 2 days, endorses generalized heaviness today. Increasing shob with exertion, generalized weakness. 97% on room air. Hx chf, weaning off lasix d/t kidney function, normal dose 80 mg BID. No lasix today but has taken every day up until today.

## 2019-12-16 NOTE — Progress Notes (Signed)
Chad [name] met inclusion exclusion criteria for the [facility] HF trial. The trial was discussed with the patient including risk versus benefits. Patient had opportunity to read the consent and ask questions. The patient signed the consent at 1900 on 12/16/2019. No study related procedures were performed prior to consent. A copy of the signed consent was given to the patient.     Patient sent home with 4 Furoscix Infusors. Patient to place first Infusor in the morning of 12/17/2019, second infusor in the afternoon 12/23 and third dose on the morning of 12/18/2019. Instructions were given for application and use. He verbalized understanding. Research will call patient tomorrow and patient will follow-up in Heart Failure clinic 12/18/2019.

## 2019-12-16 NOTE — Discharge Instructions (Signed)
You have been enrolled in the FREEDOM Heart Failure trial. Please use the subcutaneous lasix as instructed. You will take two doses tomorrow, and one on Thursday. Increase your potassium to 20 meq two times per day for the next two days. The Heart Failure clinic will be in contact with you tomorrow.

## 2019-12-16 NOTE — ED Notes (Signed)
Pt given food per provider.

## 2019-12-17 ENCOUNTER — Other Ambulatory Visit (HOSPITAL_COMMUNITY): Payer: Medicaid Other

## 2019-12-17 ENCOUNTER — Encounter: Payer: Self-pay | Admitting: *Deleted

## 2019-12-17 ENCOUNTER — Telehealth (HOSPITAL_COMMUNITY): Payer: Self-pay | Admitting: *Deleted

## 2019-12-17 DIAGNOSIS — Z006 Encounter for examination for normal comparison and control in clinical research program: Secondary | ICD-10-CM

## 2019-12-17 MED ORDER — POTASSIUM CHLORIDE CRYS ER 20 MEQ PO TBCR: 20.0000 meq | EXTENDED_RELEASE_TABLET | Freq: Two times a day (BID) | ORAL | 6 refills | Status: DC

## 2019-12-17 NOTE — Telephone Encounter (Signed)
Pt was enrolled into FREEDOM Trial yesterday and needs f/u appt sch.  I called and spoke w/pt, he states he just completed 1 kit, he is aware to not take oral Furosemide today.  He states he just took his last KCL and needs a refill for this.  He states so far he is not really feeling much better that last night, but hopes this dose of med will help him feel better soon.  RX for kcl sent in, appt sch for 12/24 at 10 am.  Advised pt if feels worse before then he can call us back, he is agreeable.

## 2019-12-17 NOTE — Research (Addendum)
Freedom Day 1 phone call visit    Date of Phone Call (DD/MON/YYYY): [id number]    Phone Call not performed: [] YES   [x]  NO   Did the subject experience any problems with drug or device use since last point of contact? [x] YES   []  NO   Has the subject been prescribed with an additional dose of treatment since last point of contact ? [] YES   [x]  NO   Weight:    Unit: []  Kilograms  [] Pounds   Weight not done: [x]   Scale broke/trying to purchase another one   Have you experienced any shortness of breath? [x] YES   []  NO  If Yes  Was it present at rest?  [x] Yes  [] No  If Yes, Did it get worse with activities?  [x] Yes  [] No  Do you get short of breath when you lie down at night?  [x] Yes  [] No  If Yes, is it the same, worse or better than the previous night?  [] Same  [] Worse  [x] Better    Have you noticed swelling of your ankles/legs or abdomen? [x] YES   []  NO improved from baseline   Last night, did you have difficulty sleeping? [x] YES   []  NO   Did you have any other symptoms from your heart failure that is different than the previous day?  [] YES   [x]  NO   Did the subject start any new medications/supplements or have any medications /supplements change since last point of contact? [] YES   [x]  NO   Did the subject have any hospital visits, emergency department visits or unscheduled clinic visits since last point of contact? [] YES   [x]  NO   Did the subject experience any Adverse Event since last point of contact? [] YES   [x]  NO   Did the subject experience any problems with drug or device use since last point of contact? [x] YES   []  NO   Has the subject been prescribed with an additional dose of treatment since last point of contact? [] YES   [x]  NO   Chad [name] says he feels slightly better. The first device did not function properly so he removed and tried a new device. He currently has the infusor in place. First dose should be completed at  approximately 3pm.

## 2019-12-18 ENCOUNTER — Ambulatory Visit (HOSPITAL_COMMUNITY)
Admission: RE | Admit: 2019-12-18 | Discharge: 2019-12-18 | Disposition: A | Discharge status: Home / Self Care | Payer: Medicaid Other | Source: Ambulatory Visit | Attending: Adult Health | Admitting: Adult Health

## 2019-12-18 ENCOUNTER — Encounter (HOSPITAL_COMMUNITY): Payer: Self-pay

## 2019-12-18 ENCOUNTER — Other Ambulatory Visit: Payer: Self-pay

## 2019-12-18 VITALS — BP 102/73 | HR 74 | Wt 168.2 lb

## 2019-12-18 DIAGNOSIS — G4730 Sleep apnea, unspecified: Secondary | ICD-10-CM | POA: Insufficient documentation

## 2019-12-18 DIAGNOSIS — Z95810 Presence of automatic (implantable) cardiac defibrillator: Secondary | ICD-10-CM | POA: Diagnosis not present

## 2019-12-18 DIAGNOSIS — E785 Hyperlipidemia, unspecified: Secondary | ICD-10-CM | POA: Insufficient documentation

## 2019-12-18 DIAGNOSIS — M79661 Pain in right lower leg: Secondary | ICD-10-CM | POA: Diagnosis not present

## 2019-12-18 DIAGNOSIS — Z951 Presence of aortocoronary bypass graft: Secondary | ICD-10-CM | POA: Insufficient documentation

## 2019-12-18 DIAGNOSIS — J449 Chronic obstructive pulmonary disease, unspecified: Secondary | ICD-10-CM | POA: Insufficient documentation

## 2019-12-18 DIAGNOSIS — R5383 Other fatigue: Secondary | ICD-10-CM | POA: Diagnosis present

## 2019-12-18 DIAGNOSIS — I255 Ischemic cardiomyopathy: Secondary | ICD-10-CM | POA: Diagnosis not present

## 2019-12-18 DIAGNOSIS — I130 Hypertensive heart and chronic kidney disease with heart failure and stage 1 through stage 4 chronic kidney disease, or unspecified chronic kidney disease: Secondary | ICD-10-CM | POA: Insufficient documentation

## 2019-12-18 DIAGNOSIS — N189 Chronic kidney disease, unspecified: Secondary | ICD-10-CM | POA: Insufficient documentation

## 2019-12-18 DIAGNOSIS — I2510 Atherosclerotic heart disease of native coronary artery without angina pectoris: Secondary | ICD-10-CM | POA: Diagnosis not present

## 2019-12-18 DIAGNOSIS — Z79899 Other long term (current) drug therapy: Secondary | ICD-10-CM | POA: Diagnosis not present

## 2019-12-18 DIAGNOSIS — I472 Ventricular tachycardia: Secondary | ICD-10-CM | POA: Insufficient documentation

## 2019-12-18 DIAGNOSIS — F419 Anxiety disorder, unspecified: Secondary | ICD-10-CM | POA: Diagnosis not present

## 2019-12-18 DIAGNOSIS — M109 Gout, unspecified: Secondary | ICD-10-CM | POA: Diagnosis not present

## 2019-12-18 DIAGNOSIS — M1990 Unspecified osteoarthritis, unspecified site: Secondary | ICD-10-CM | POA: Insufficient documentation

## 2019-12-18 DIAGNOSIS — Z8774 Personal history of (corrected) congenital malformations of heart and circulatory system: Secondary | ICD-10-CM | POA: Insufficient documentation

## 2019-12-18 DIAGNOSIS — R011 Cardiac murmur, unspecified: Secondary | ICD-10-CM | POA: Insufficient documentation

## 2019-12-18 DIAGNOSIS — I5022 Chronic systolic (congestive) heart failure: Secondary | ICD-10-CM | POA: Diagnosis not present

## 2019-12-18 DIAGNOSIS — Z7982 Long term (current) use of aspirin: Secondary | ICD-10-CM | POA: Insufficient documentation

## 2019-12-18 DIAGNOSIS — M79662 Pain in left lower leg: Secondary | ICD-10-CM | POA: Diagnosis not present

## 2019-12-18 DIAGNOSIS — N182 Chronic kidney disease, stage 2 (mild): Secondary | ICD-10-CM | POA: Diagnosis not present

## 2019-12-18 DIAGNOSIS — Z7902 Long term (current) use of antithrombotics/antiplatelets: Secondary | ICD-10-CM | POA: Diagnosis not present

## 2019-12-18 DIAGNOSIS — D649 Anemia, unspecified: Secondary | ICD-10-CM | POA: Insufficient documentation

## 2019-12-18 DIAGNOSIS — I10 Essential (primary) hypertension: Secondary | ICD-10-CM | POA: Diagnosis not present

## 2019-12-18 DIAGNOSIS — M542 Cervicalgia: Secondary | ICD-10-CM | POA: Diagnosis not present

## 2019-12-18 DIAGNOSIS — G894 Chronic pain syndrome: Secondary | ICD-10-CM | POA: Diagnosis not present

## 2019-12-18 DIAGNOSIS — I4729 Other ventricular tachycardia: Secondary | ICD-10-CM

## 2019-12-18 DIAGNOSIS — I252 Old myocardial infarction: Secondary | ICD-10-CM | POA: Diagnosis not present

## 2019-12-18 LAB — CBC
HCT: 39.4 % (ref 39.0–52.0)
Hemoglobin: 12.7 g/dL — ABNORMAL LOW (ref 13.0–17.0)
MCH: 28 pg (ref 26.0–34.0)
MCHC: 32.2 g/dL (ref 30.0–36.0)
MCV: 86.8 fL (ref 80.0–100.0)
Platelets: 103 10*3/uL — ABNORMAL LOW (ref 150–400)
RBC: 4.54 MIL/uL (ref 4.22–5.81)
RDW: 14.6 % (ref 11.5–15.5)
WBC: 3.3 10*3/uL — ABNORMAL LOW (ref 4.0–10.5)
nRBC: 0 % (ref 0.0–0.2)

## 2019-12-18 LAB — BASIC METABOLIC PANEL
Anion gap: 10 (ref 5–15)
BUN: 16 mg/dL (ref 6–20)
CO2: 25 mmol/L (ref 22–32)
Calcium: 8.7 mg/dL — ABNORMAL LOW (ref 8.9–10.3)
Chloride: 106 mmol/L (ref 98–111)
Creatinine, Ser: 1.59 mg/dL — ABNORMAL HIGH (ref 0.61–1.24)
GFR calc Af Amer: 55 mL/min — ABNORMAL LOW (ref >60)
GFR calc non Af Amer: 48 mL/min — ABNORMAL LOW (ref >60)
Glucose, Bld: 129 mg/dL — ABNORMAL HIGH (ref 70–99)
Potassium: 3.5 mmol/L (ref 3.5–5.1)
Sodium: 141 mmol/L (ref 135–145)

## 2019-12-18 LAB — BRAIN NATRIURETIC PEPTIDE: B Natriuretic Peptide: 3853.7 pg/mL — ABNORMAL HIGH (ref 0.0–100.0)

## 2019-12-18 LAB — MAGNESIUM: Magnesium: 2.2 mg/dL (ref 1.7–2.4)

## 2019-12-18 MED ORDER — TORSEMIDE 20 MG PO TABS: 40.0000 mg | ORAL_TABLET | Freq: Two times a day (BID) | ORAL | 3 refills | Status: DC

## 2019-12-18 MED ORDER — POTASSIUM CHLORIDE CRYS ER 20 MEQ PO TBCR: 40.0000 meq | EXTENDED_RELEASE_TABLET | Freq: Two times a day (BID) | ORAL | 6 refills | Status: DC

## 2019-12-18 NOTE — Patient Instructions (Addendum)
STOP Lasix  START Torsemide 40mg  ( 2 tabs) twice a day  TAKE Potassium 48meq ( 2 tabs) twice day  Labs today and repeat in 1 week We will only contact you if something comes back abnormal or we need to make some changes. Otherwise no news is good news!  Your physician has recommended that you have a cardiopulmonary stress test (CPX). CPX testing is a non-invasive measurement of heart and lung function. It replaces a traditional treadmill stress test. This type of test provides a tremendous amount of information that relates not only to your present condition but also for future outcomes. This test combines measurements of you ventilation, respiratory gas exchange in the lungs, electrocardiogram (EKG), blood pressure and physical response before, during, and following an exercise protocol.   Your physician has requested that you have an echocardiogram. Echocardiography is a painless test that uses sound waves to create images of your heart. It provides your doctor with information about the size and shape of your heart and how well your heart's chambers and valves are working. This procedure takes approximately one hour. There are no restrictions for this procedure.   Your physician recommends that you schedule a follow-up appointment in: Keep your next appointment with Dr Haroldine Laws and ECHO on 01/05/20.   Your physician recommends that you schedule a follow-up appointment in:  4 weeks with the Nurse Practitioner.    Please call office at [phone] option [phone] if you have any questions or concerns.

## 2019-12-18 NOTE — Progress Notes (Signed)
Advanced Heart Failure Clinic Note   Patient ID: Chad Bark., male   DOB: [birth year], 58 y.o.   MRN: [id number]   Primary Cardiologist:  Chad [name]  History of Present Illness: Chad [name] is a 58 y.o. male with history of severe HTN, coronary artery disease status post previous myocardial infarction and bypass surgery in 2006 also DES to native PDA in 2011.  He also has a history congestive heart failure secondary to ischemic cardiomyopathy EF 20-25%   He is s/p single chamber [employer] ICD.  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 Also with significant low back pain s/p 2 surgeries.   He has had episodes of CP but have avoided cath due to previous dissection and solitary kidney.  Myoview 11/2015  1. Evidence for very mild reversibility and ischemia involving the anterolateral wall and a portion of the lateral wall. 2. Large infarct involving the inferior wall. There is dyskinesia in the inferior wall. 3. Left ventricular ejection fraction is 32%. Left ventricle dilatation.  In 11/17 admitted for ICD shocks due to fractured RV lead. Underwent lead extraction and replacement.   We saw him in 2/18 for acute visit due to increasing dyspnea and volume overload. ECHO EF 20-25% moderate RV dysfunction. Moderate AI and severe MR. Ao Root 25mm-> 28mm. In 4/18, he saw Dr. 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 He was referred to [facility] Transplant team for further evaluation in 6/18 and felt to be too early for transplant based on  CPX.  Admitted 5/6-05/01/18 with chest pain. Troponins negative and EKG was unchanged. Chest pain thought to be related to HTN and possibly anxiety. He declined celexa. He was instructed to take extra 50 mg hydralazine for SBP >140 and 1 mg cardura for SBP >160.  Repeat CPX in 5/19 stable from previous Peak VO2: 18.7 (56% predicted peak VO2) VE/VCO2 slope: 34  Underwent lap chole in 12/19.   Evaluated in EF on 12/16/2019 with increased dyspnea and volume overload. He had increased his home lasix regimen from 80 mg twice a day to 240 mg twice a day with no improvement.  BNP 3791. Started Freedom Trial for home subcutaneous lasix. He had 2 doses of subcutaneous lasix 80 mg.  He has not been able to weigh at home. Says he had an issue with first subcutaneous lasix pump.  Overall feeling a little better.  Complaining of fatigue. SOB with exertion. + Orthopnea. Denies PND. [name] Appetite ok. No fever or chills. He has not been weighing at home because he doesn't have a working scal.e Weight at home pounds. Taking all medications. Says he is taking extra hydralazine throughout the day for high blood pressure.  CPX 05/15/18 FVC 2.86 (67%)    FEV1 2.26 (68%)     FEV1/FVC 79 (100%)     MVV 106 (70%)  Resting HR: 71 Peak HR: 110  (67% age predicted max HR) BP rest: 100/64 BP peak: 118/66 Peak VO2: 18.7 (56% predicted peak VO2) VE/VCO2 slope: 34 OUES: 1.45 Peak RER: 1.04 Ventilatory Threshold: 11.8 (35% predicted and 63%  measured peak VO2) VE/MVV: 43% O2pulse: 12  (80% predicted O2pulse)  CPX 4/18   FVC 3.11 (73%)    FEV1 2.40 (71%)     FEV1/FVC 77 (97%)     MVV 117 (77%)  Resting HR: 75 Peak HR: 134  (81% age predicted max HR) BP rest: 120/68 BP peak: 146/82 Peak VO2: 18.6 (55% predicted peak VO2) VE/VCO2 slope: 31 OUES: 2.01 Peak RER: 1.05 Ventilatory Threshold: 16.0 (47% predicted or measured peak VO2) VE/MVV: 49% O2pulse: 13  (76% predicted  O2pulse)  04/18/2018 20-25%.  3/18: ECHO EF 20-25% Moderate AI, moderate to severe MR, moderate TR. Ao Root 50cm 10/22/2015: ECHO EF 20-25% 11/26/12 ECHO EF 35-40%  Review of systems complete and found to be negative unless listed in HPI.   Past Medical History:  Diagnosis Date  . AICD (automatic cardioverter/defibrillator) present   . Anemia   . Anginal pain ([place])   . Anxiety   . Aortic dissection, thoracoabdominal ([place])    7/10: Type I s/p repair  . Arthritis   . Asthma   . CAD (coronary artery disease)    a. s/p CABG 2006;  b. DES to PDA 2011 (cath: Dx not seen, dRCA/PDA tx with DES; S-PDA occluded (culprit), S-Dx occluded, S-RI and OM ok, L-LAD ok  . Carotid stenosis    dopplers 2011: 0-39% bilat.  . Chest pain syndrome   . CHF (congestive heart failure) ([place])   . Chronic bronchitis ([place])   . Chronic lower back pain   . Chronic systolic heart failure ([facility])    a. 12/13 ECHO: EF 35-40%, sept, apical & posterobasal HK, LV mod dil & sys fx mod reduced, mild AI, MV mild reg, TV mild reg  . Complication of anesthesia    "difficult to wake afterwards a couple of times" and patient states he has woken during surgery   . 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 Dyspnea    patient denies at 12/06/2018 appt   . Family history of adverse reaction to anesthesia    "sister hard to wake up"  . GERD (gastroesophageal reflux disease)   . Gout   . Heart murmur   . HLD (hyperlipidemia)   . HTN (hypertension)    severe  . Myocardial infarction [facility])    "many" (11/07/2016)  . PONV (postoperative nausea and vomiting)   . Sleep apnea    does not use mask   . Thyroid cancer ([place])    Hertle Cell    Current Outpatient Medications  Medication Sig Dispense Refill  . albuterol (PROVENTIL HFA;VENTOLIN HFA) 108 (90 Base) MCG/ACT inhaler Inhale 2 puffs into the lungs every 6 (six) hours as needed for wheezing or shortness of breath. 1  Inhaler 0  . Alirocumab (PRALUENT) 75 MG/ML SOAJ Inject 1 pen into the skin every 14 (fourteen) days. 2 pen 11  . allopurinol (ZYLOPRIM) 100 MG tablet Take 0.5 tablets (50 mg total) by mouth daily. 45 tablet 3  . aspirin EC 81 MG tablet Take 1 tablet (81 mg total) by mouth daily. Can take 4 tabs (324 mg total) as needed for chest pain. DO NOT TAKE multiple 325 mg tabs. 40 tablet 6  . B Complex-C (SUPER B COMPLEX PO) Take 3 tablets by mouth daily.     . carvedilol (COREG) 25 MG tablet TAKE 1 TABLET BY MOUTH TWICE DAILY WITH MEALS 180 tablet 0  . clopidogrel (PLAVIX) 75 MG  tablet Take 1 tablet (75 mg total) by mouth daily. 90 tablet 3  . cyclobenzaprine (FLEXERIL) 10 MG tablet Take 10 mg by mouth 3 (three) times daily as needed for muscle spasms.    . diazepam (VALIUM) 5 MG tablet Take 1 tablet (5 mg total) by mouth 2 (two) times daily. 10 tablet 0  . doxazosin (CARDURA) 1 MG tablet Take 1 tablet (1 mg total) by mouth every 12 (twelve) hours as needed (for systolic BP > 0000000 mm Hg.). 90 tablet 3  . fluticasone (FLONASE) 50 MCG/ACT nasal spray Place 2 sprays into both nostrils daily as needed for allergies or rhinitis.     . furosemide (LASIX) 80 MG tablet Take 1 tablet (80 mg total) by mouth 2 (two) times daily. 60 tablet 0  . hydrALAZINE (APRESOLINE) 50 MG tablet TAKE 1 & 1/2 (ONE & ONE-HALF) TABLETS BY MOUTH TWICE DAILY 270 tablet 3  . ipratropium-albuterol (DUONEB) 0.5-2.5 (3) MG/3ML SOLN Take 3 mLs by nebulization every 6 (six) hours as needed. 360 mL 3  . MITIGARE 0.6 MG CAPS Take 2 capsules by mouth daily. 180 capsule 2  . nitroGLYCERIN (NITROSTAT) 0.4 MG SL tablet DISSOLVE ONE TABLET UNDER THE TONGUE EVERY 5 MINUTES FOR 3 DOSES AS NEEDED FOR CHEST PAIN. 25 tablet 0  . Oxycodone HCl 10 MG TABS Take 20 mg by mouth every 8 (eight) hours.     . potassium chloride SA (KLOR-CON) 20 MEQ tablet Take 1 tablet (20 mEq total) by mouth 2 (two) times daily. 60 tablet 6  . sacubitril-valsartan (ENTRESTO)  97-103 MG Take 1 tablet by mouth 2 (two) times daily. 180 tablet 3  . sildenafil (VIAGRA) 100 MG tablet TAKE 1/2 (ONE-HALF) TABLET BY MOUTH ONCE DAILY AS NEEDED FOR ERECTILE DYSFUNCTION 30 tablet 3  . spironolactone (ALDACTONE) 25 MG tablet Take 0.5 tablets (12.5 mg total) by mouth daily. 45 tablet 3  . ursodiol (ACTIGALL) 500 MG tablet Take 1 tablet by mouth twice daily 60 tablet 0   No current facility-administered medications for this encounter.    Allergies  Allergen Reactions  . Contrast Media [Iodinated Diagnostic Agents] Anaphylaxis  . Iohexol Anaphylaxis and Other (See Comments)    PT HAS ANAPHYLAXIS WITH CONTRAST MEDIA!  . Lipitor [Atorvastatin Calcium] Anaphylaxis and Other (See Comments)    Large doses  . Shellfish Allergy Anaphylaxis  . Sulfa Antibiotics Shortness Of Breath and Swelling  . Sulfonamide Derivatives Shortness Of Breath and Swelling  . Almond Oil Itching    FACIAL/MOUTH ITCHING  . Metrizamide Swelling    SWELLING REACTION UNSPECIFIED   . Zocor [Simvastatin] Other (See Comments)    Muscle cramps  . Latex Rash and Other (See Comments)    With long periods of exposure  . Peach [Prunus Persica] Itching    Vital Signs: Vitals:   12/18/19 0944  BP: 102/73  Pulse: 74  SpO2: 98%  Weight: 76.3 kg (168 lb 3.2 oz)   Filed Weights   12/18/19 0944  Weight: 76.3 kg (168 lb 3.2 oz)    Wt Readings from Last 3 Encounters:  12/18/19 76.3 kg (168 lb 3.2 oz)  12/08/19 76.2 kg (168 lb)  09/29/19 78.5 kg (173 lb)   Physical Exam: General:  Appears fatigued. No resp difficulty HEENT: normal Neck: supple. JVP 9-10 . Carotids 2+ bilat; no bruits. No lymphadenopathy or thryomegaly appreciated. Cor: PMI nondisplaced. Regular rate & rhythm. No rubs, gallops or murmurs. Lungs: clear Abdomen: soft, nontender, nondistended. No hepatosplenomegaly. No bruits or  masses. Good bowel sounds. Extremities: no cyanosis, clubbing, rash, edema Neuro: alert & orientedx3, cranial  nerves grossly intact. moves all 4 extremities w/o difficulty. Affect pleasant   ASSESSMENT/Plan  1. Chronic systolic HF: Ischemic cardiomyopathy.  Echo (2/16) with EF 25%. S/p [employer] ICD. Echo 3/18. EF 20-25%. Echo 04/18/18: EF 20-25%, grade 2 DD - RHC 04/05/18 with well compensated filling pressures with mildly to moderately reduced CO.  - Repeat CPX in 5/19 stable from previous Peak VO2: 18.7 (56% predicted peak VO2) VE/VCO2 slope: 34 - He has seen w Dr. Prescott [name]. Given previous dissection and aortic root replacement likely not a candidate for VAD here. - He has been seen previously at [facility] for transplant eval but CPX too good at this point.  - NYHA III. Sounds like he had good urine output with subcutaneous lasix. Stop lasix and start torsemide 40 mg twice a day.  - He has been sporadically taking potassium. Change potassium to 40 meq twice a day.  - Continue Entresto 97/103 bid - Continue carvedilol 25 mg twice a day.  - Continue hydralazine 75 mg bid - Will stop Imdur with Viagra - Continue spironolactone 12.5 mg daily.  -- Check ECHO and set up CPX>   2. CAD s/p CABG 2006:  - No CP currently.  - Cardiolite 11/2015 with evidence for mild reversibility involving the anterolateral wall and a portion of the lateral wall. - We have avoided left heart cath due to dissection and CKD with solitary kidney (lost kidney due to dissection) - Continue Plavix and ASA 81 mg daily.   - Statin intolerance at any dose. Also unable to tolerate Zetia - Refer to Lipid Clinic for possible PCSK-9    3. CKD in setting of solitary kidney s/p dissection - Check BMET   4. HTN:  - He has been taking extra hydralazine. I have asked him to take medications as directed.  Stressed importance of not over-treating in setting of solitary kidney.  5. H/o Type I aortic dissection: Follows with CVTS.  - He has seen w Dr. Prescott [name]. Given previous dissection and aortic root replacement likely not  candidate for VAD. - Echo 04/18/18: aortic root, ascending aorta, and aortic arch all mildly dilated - repeat ECHO   6. NSVT     Start Date for FREEDOM HF Trial : 12/16/2019  Any change in the diuretic regimen since last point of contact? No  Abdominal skin assessment is without evidence of skin reaction? No  Admitted to hospital since last ED visit? No  Admitted to hospital for heart failure related issues? No  Evaluated in the emergency department for HF related issues? No  Office visit for HF related issues? NO  Sought any additional or new medical treatment for HF related issues?No   Any problems with drug or device? 1 pump did not work or run. He said he pushed the button but it didn't work. He had to use the second pump.  NYHA Class? III Check CBC, BMET, Mag, BNP today.   Check BMET next week.  Follow up in 4weeks and plan to repeat CBC, BMET, Mag, BNP    Darrick Grinder, NP 9:52 AM

## 2019-12-19 ENCOUNTER — Telehealth: Payer: Self-pay | Admitting: Cardiology

## 2019-12-19 NOTE — Telephone Encounter (Signed)
Patient called tonight stating that his BP has been very erratic.  He tells me that one minute it is 150/60mmHg and then drops too low to 120/70's.  He says that he thinks his heart is going to stop working and that his BP is going to cause him to have a heart attack or stroke.  He denies any chest pain, SOB or any other sx at this time.  He saw Darrick Grinder, NP this am and was doing fine and changed from SQ Lasix study to Torsemide which he picked up this evening.    Currently his BP is stable at 126/33mmHg.  I reassured him that his BP is fine and asked him to check his BP again in the am and if he is concerned that it is too high then call back.  I do not seen an indication for going emergently to the ER tonight.  THe patient was very anxious about his BP and concerned that it changes from minute to minute.  I again tried to reassure him and to call back in am if BP elevated again.

## 2019-12-24 ENCOUNTER — Encounter: Payer: Self-pay | Admitting: *Deleted

## 2019-12-24 DIAGNOSIS — Z006 Encounter for examination for normal comparison and control in clinical research program: Secondary | ICD-10-CM

## 2019-12-24 NOTE — Research (Addendum)
Freedom Day 7 phone call visit    Date of Phone Call (DD/MON/YYYY):  12/24/2019   Phone Call not performed: [] YES   []  NO   Did the subject experience any problems with drug or device use since last point of contact? [] YES   [x]  NO   Has the subject been prescribed with an additional dose of treatment since last point of contact ? [] YES   [x]  NO   Weight: 165.9   Unit: []  Kilograms  [x] Pounds   Weight not done: []    Have you experienced any shortness of breath? [x] YES   []  NO  If Yes  Was it present at rest?  [] Yes  [x] No  If Yes, Did it get worse with activities?  [x] Yes  [] No  Do you get short of breath when you lie down at night?  [] Yes  [x] No  If Yes, is it the same, worse or better than the previous night?  [] Same  [] Worse  [] Better    Have you noticed swelling of your ankles/legs or abdomen? [] YES   [x]  NO   Last night, did you have difficulty sleeping? [] YES   [x]  NO   Did you have any other symptoms from your heart failure that is different than the previous day?  [] YES   [x]  NO   Did the subject start any new medications/supplements or have any medications /supplements change since last point of contact? [] YES   [x]  NO   Did the subject have any hospital visits, emergency department visits or unscheduled clinic visits since last point of contact? [] YES   [x]  NO   Did the subject experience any Adverse Event since last point of contact? [] YES   [x]  NO   Did the subject experience any problems with drug or device use since last point of contact? [] YES   [x]  NO   Has the subject been prescribed with an additional dose of treatment since last point of contact? [] YES   [x]  NO

## 2019-12-25 ENCOUNTER — Other Ambulatory Visit: Payer: Self-pay

## 2019-12-25 ENCOUNTER — Ambulatory Visit (HOSPITAL_COMMUNITY)
Admission: RE | Admit: 2019-12-25 | Discharge: 2019-12-25 | Disposition: A | Discharge status: Home / Self Care | Payer: Medicaid Other | Source: Ambulatory Visit | Attending: Cardiology | Admitting: Cardiology

## 2019-12-25 ENCOUNTER — Telehealth (HOSPITAL_COMMUNITY): Payer: Self-pay | Admitting: *Deleted

## 2019-12-25 DIAGNOSIS — I5022 Chronic systolic (congestive) heart failure: Secondary | ICD-10-CM | POA: Insufficient documentation

## 2019-12-25 DIAGNOSIS — I5042 Chronic combined systolic (congestive) and diastolic (congestive) heart failure: Secondary | ICD-10-CM

## 2019-12-25 LAB — BASIC METABOLIC PANEL
Anion gap: 9 (ref 5–15)
BUN: 26 mg/dL — ABNORMAL HIGH (ref 6–20)
CO2: 27 mmol/L (ref 22–32)
Calcium: 8.4 mg/dL — ABNORMAL LOW (ref 8.9–10.3)
Chloride: 105 mmol/L (ref 98–111)
Creatinine, Ser: 1.81 mg/dL — ABNORMAL HIGH (ref 0.61–1.24)
GFR calc Af Amer: 47 mL/min — ABNORMAL LOW (ref >60)
GFR calc non Af Amer: 41 mL/min — ABNORMAL LOW (ref >60)
Glucose, Bld: 130 mg/dL — ABNORMAL HIGH (ref 70–99)
Potassium: 4.2 mmol/L (ref 3.5–5.1)
Sodium: 141 mmol/L (ref 135–145)

## 2019-12-25 LAB — BRAIN NATRIURETIC PEPTIDE: B Natriuretic Peptide: 4065.8 pg/mL — ABNORMAL HIGH (ref 0.0–100.0)

## 2019-12-25 NOTE — Telephone Encounter (Signed)
Pt was in the office for labwork today and c/o feeling like he was retaining more fluid in his abd.  He states he has been taking his Torsemide as prescribed.  Discussed all above w/Dr Aundra Dubin, he advised pt can take 4 doses of SQ Lasix as pt is in FREEDOM trial.  Pt aware, agreeable and verbalizes understanding.  Research coordinator aware and got the SQ doses.  Pt will take 1 SQ this evening, take it BID tomorrow and last dose on Sat AM, he will continue current KCL dose and will restart his Torsemide on Sat evening.  Appt for echo and f/u appt resch for Wed 1/6

## 2019-12-25 NOTE — Addendum Note (Signed)
Encounter addended by: Scarlette Calico, RN on: 12/25/2019 11:54 AM  Actions taken: Visit diagnoses modified, Order list changed, Diagnosis association updated

## 2019-12-26 ENCOUNTER — Other Ambulatory Visit (HOSPITAL_COMMUNITY): Payer: Self-pay | Admitting: Internal Medicine

## 2019-12-26 MED ORDER — ENTRESTO 97-103 MG PO TABS: 1.0000 | ORAL_TABLET | Freq: Two times a day (BID) | ORAL | 3 refills | Status: DC

## 2019-12-29 ENCOUNTER — Other Ambulatory Visit (HOSPITAL_COMMUNITY): Payer: Self-pay

## 2019-12-29 DIAGNOSIS — H2513 Age-related nuclear cataract, bilateral: Secondary | ICD-10-CM | POA: Diagnosis not present

## 2019-12-29 MED ORDER — CLOPIDOGREL BISULFATE 75 MG PO TABS: 75.0000 mg | ORAL_TABLET | Freq: Every day | ORAL | 3 refills | Status: DC

## 2019-12-30 ENCOUNTER — Telehealth (HOSPITAL_COMMUNITY): Payer: Self-pay | Admitting: Cardiology

## 2019-12-30 NOTE — Progress Notes (Signed)
Advanced Heart Failure Clinic Note   Patient ID: Chad [name]., male   DOB: [birth year], 59 y.o.   MRN: [id number]   Primary Cardiologist:  Dr. Glori Bickers  History of Present Illness: Chad [name] is a 59 y.o. male with history of severe HTN, coronary artery disease status post previous myocardial infarction and bypass surgery in 2006 also DES to native PDA in 2011.  He also has a history congestive heart failure secondary to ischemic cardiomyopathy EF 20-25%   He is s/p single chamber [employer] ICD.  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 Also with significant low back pain s/p 2 surgeries.   He has had episodes of CP but have avoided cath due to previous dissection and solitary kidney.  Myoview 11/2015  1. Evidence for very mild reversibility and ischemia involving the anterolateral wall and a portion of the lateral wall. 2. Large infarct involving the inferior wall. There is dyskinesia in the inferior wall. 3. Left ventricular ejection fraction is 32%. Left ventricle dilatation.  In 11/17 admitted for ICD shocks due to fractured RV lead. Underwent lead extraction and replacement.   We saw him in 2/18 for acute visit due to increasing dyspnea and volume overload. ECHO EF 20-25% moderate RV dysfunction. Moderate AI and severe MR. Ao Root 63mm-> 52mm. In 4/18, he saw Dr. 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 He was referred to [facility] Transplant team for further evaluation in 6/18 and felt to be too early for transplant based on CPX.  Admitted 5/6-05/01/18 with chest pain. Troponins negative and EKG was unchanged. Chest pain thought to be related to HTN and possibly anxiety. He declined celexa. He was instructed to take extra 50 mg hydralazine for SBP >140 and 1 mg cardura for SBP >160.  Repeat CPX in 5/19 stable from previous Peak VO2: 18.7 (56% predicted peak VO2) VE/VCO2 slope: 34  Underwent lap chole in 12/19.   Evaluated in EF on 12/16/2019 with increased dyspnea and volume overload. He had increased his home lasix regimen from 80 mg twice a day to 240 mg twice a day with no improvement.  BNP 3791. Started [facility] Trial for home subcutaneous lasix. He had 2 doses of subcutaneous lasix 80 mg.  He has not been able to weigh at home. Says he had an issue with first subcutaneous lasix pump.  Today he returns for HF follow up.On 12/31 he was instructed to take 4 extra doses of IV lasix.  Overall feeling a little better. He says the last 24 hours have been much better. He remains SOB with exertion and steps. Ok if he takes his time walking. He notices symptoms more at night. Denies PND/Orthopnea. Appetite ok. No fever or chills. Weight at home 154-155 pounds. Taking all medications.   CPX 05/15/18 FVC 2.86 (67%)    FEV1 2.26 (68%)     FEV1/FVC 79 (100%)     MVV 106 (70%)  Resting HR: 71 Peak HR: 110  (67% age predicted max HR) BP rest: 100/64 BP peak: 118/66 Peak VO2: 18.7 (56% predicted peak VO2) VE/VCO2  slope: 34 OUES: 1.45 Peak RER: 1.04 Ventilatory Threshold: 11.8 (35% predicted and 63% measured peak VO2) VE/MVV: 43% O2pulse: 12  (80% predicted O2pulse)  CPX 4/18  FVC 3.11 (73%)    FEV1 2.40 (71%)     FEV1/FVC 77 (97%)     MVV 117 (77%) Resting HR: 75 Peak HR: 134  (81% age predicted max HR) BP rest: 120/68 BP peak: 146/82 Peak VO2: 18.6 (55% predicted peak VO2) VE/VCO2 slope: 31 OUES: 2.01 Peak RER: 1.05 Ventilatory Threshold: 16.0 (47% predicted or measured peak VO2) VE/MVV: 49%  O2pulse: 13  (76% predicted O2pulse)  04/18/2018 20-25%.  3/18: ECHO EF 20-25% Moderate AI, moderate to severe MR, moderate TR. Ao Root 50cm 10/22/2015: ECHO EF 20-25% 11/26/12 ECHO EF 35-40%  Review of systems complete and found to be negative unless listed in HPI.   Past Medical History:  Diagnosis Date  . AICD (automatic cardioverter/defibrillator) present   . Anemia   . Anginal pain ([place])   . Anxiety   . Aortic dissection, thoracoabdominal ([place])    7/10: Type I s/p repair  . Arthritis   . Asthma   . CAD (coronary artery disease)    a. s/p CABG 2006;  b. DES to PDA 2011 (cath: Dx not seen, dRCA/PDA tx with DES; S-PDA occluded (culprit), S-Dx occluded, S-RI and OM ok, L-LAD ok  . Carotid stenosis    dopplers 2011: 0-39% bilat.  . Chest pain syndrome   . CHF (congestive heart failure) ([place])   . Chronic bronchitis ([place])   . Chronic lower back pain   . Chronic systolic heart failure (HCC)    a. 12/13 ECHO: EF 35-40%, sept, apical & posterobasal HK, LV mod dil & sys fx mod reduced, mild AI, MV mild reg, TV mild reg  . Complication of anesthesia    "difficult to wake afterwards a couple of times" and patient states he has woken during surgery   . 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 Dyspnea    patient denies at 12/06/2018 appt   . Family history of adverse reaction to anesthesia    "sister hard to wake up"  . GERD (gastroesophageal reflux disease)   . Gout   . Heart murmur   . HLD (hyperlipidemia)   . HTN (hypertension)    severe  . Myocardial infarction [facility])    "many" (11/07/2016)  . PONV (postoperative nausea and vomiting)   . Sleep apnea    does not use mask   . Thyroid cancer ([place])    Hertle Cell    Current Outpatient Medications  Medication Sig Dispense Refill  . albuterol (PROVENTIL HFA;VENTOLIN HFA) 108 (90 Base) MCG/ACT inhaler Inhale 2 puffs into the lungs every 6 (six) hours as needed for  wheezing or shortness of breath. 1 Inhaler 0  . Alirocumab (PRALUENT) 75 MG/ML SOAJ Inject 1 pen into the skin every 14 (fourteen) days. 2 pen 11  . allopurinol (ZYLOPRIM) 100 MG tablet Take 0.5 tablets (50 mg total) by mouth daily. 45 tablet 3  . aspirin EC 81 MG tablet Take 1 tablet (81 mg total) by mouth daily. Can take 4 tabs (324 mg total) as needed for chest pain. DO NOT TAKE multiple 325 mg tabs. 40 tablet 6  . B Complex-C (SUPER B COMPLEX PO) Take 3 tablets by mouth daily.     . carvedilol (COREG) 25 MG tablet TAKE 1 TABLET BY MOUTH TWICE  DAILY WITH MEALS 180 tablet 0  . clopidogrel (PLAVIX) 75 MG tablet Take 1 tablet (75 mg total) by mouth daily. 90 tablet 3  . cyclobenzaprine (FLEXERIL) 10 MG tablet Take 10 mg by mouth 3 (three) times daily as needed for muscle spasms.    [name] doxazosin (CARDURA) 1 MG tablet Take 1 tablet (1 mg total) by mouth every 12 (twelve) hours as needed (for systolic BP > 0000000 mm Hg.). 90 tablet 3  . fluticasone (FLONASE) 50 MCG/ACT nasal spray Place 2 sprays into both nostrils daily as needed for allergies or rhinitis.     . hydrALAZINE (APRESOLINE) 50 MG tablet TAKE 1 & 1/2 (ONE & ONE-HALF) TABLETS BY MOUTH TWICE DAILY (Patient taking differently: Take 75 mg by mouth every 6 (six) hours. ) 270 tablet 3  . ipratropium-albuterol (DUONEB) 0.5-2.5 (3) MG/3ML SOLN Take 3 mLs by nebulization every 6 (six) hours as needed. 360 mL 3  . MITIGARE 0.6 MG CAPS Take 2 capsules by mouth daily. 180 capsule 2  . nitroGLYCERIN (NITROSTAT) 0.4 MG SL tablet DISSOLVE ONE TABLET UNDER THE TONGUE EVERY 5 MINUTES FOR 3 DOSES AS NEEDED FOR CHEST PAIN. 25 tablet 0  . Oxycodone HCl 10 MG TABS Take 20 mg by mouth every 8 (eight) hours.     . potassium chloride SA (KLOR-CON) 20 MEQ tablet Take 2 tablets (40 mEq total) by mouth 2 (two) times daily. 120 tablet 6  . sacubitril-valsartan (ENTRESTO) 97-103 MG Take 1 tablet by mouth 2 (two) times daily. 180 tablet 3  . sildenafil (VIAGRA) 100 MG  tablet TAKE 1/2 (ONE-HALF) TABLET BY MOUTH ONCE DAILY AS NEEDED FOR ERECTILE DYSFUNCTION 30 tablet 3  . spironolactone (ALDACTONE) 25 MG tablet Take 0.5 tablets (12.5 mg total) by mouth daily. 45 tablet 3  . torsemide (DEMADEX) 20 MG tablet Take 2 tablets (40 mg total) by mouth 2 (two) times daily. 120 tablet 3  . ursodiol (ACTIGALL) 500 MG tablet Take 1 tablet by mouth twice daily 60 tablet 0   No current facility-administered medications for this encounter.    Allergies  Allergen Reactions  . Contrast Media [Iodinated Diagnostic Agents] Anaphylaxis  . Iohexol Anaphylaxis and Other (See Comments)    PT HAS ANAPHYLAXIS WITH CONTRAST MEDIA!  . Lipitor [Atorvastatin Calcium] Anaphylaxis and Other (See Comments)    Large doses  . Shellfish Allergy Anaphylaxis  . Sulfa Antibiotics Shortness Of Breath and Swelling  . Sulfonamide Derivatives Shortness Of Breath and Swelling  . Almond Oil Itching    FACIAL/MOUTH ITCHING  . Metrizamide Swelling    SWELLING REACTION UNSPECIFIED   . Zocor [Simvastatin] Other (See Comments)    Muscle cramps  . Latex Rash and Other (See Comments)    With long periods of exposure  . Peach [Prunus Persica] Itching    Vital Signs: Vitals:   12/31/19 1021  BP: 104/80  Pulse: 64  SpO2: 96%  Weight: 76.2 kg (168 lb)   Filed Weights   12/31/19 1021  Weight: 76.2 kg (168 lb)    Wt Readings from Last 3 Encounters:  12/31/19 76.2 kg (168 lb)  12/18/19 76.3 kg (168 lb 3.2 oz)  12/08/19 76.2 kg (168 lb)  .Physical Exam: General:  No resp difficulty HEENT: normal Neck: supple. JVP 6-7 . Carotids 2+ bilat; no bruits. No lymphadenopathy or thryomegaly appreciated. Cor: PMI nondisplaced. Regular rate & rhythm. No rubs, gallops.  3/6 MR. Lungs: clear Abdomen: soft, nontender, nondistended. No hepatosplenomegaly. No bruits or masses. Good bowel  sounds. Extremities: no cyanosis, clubbing, rash, edema Neuro: alert & orientedx3, cranial nerves grossly intact.  moves all 4 extremities w/o difficulty. Affect pleasant  EKG: NSR 66 bpm  ASSESSMENT/Plan  1. Chronic systolic HF: Ischemic cardiomyopathy.  Echo (2/16) with EF 25%. S/p [employer] ICD. Echo 3/18. EF 20-25%. Echo 04/18/18: EF 20-25%, grade 2 DD\ - RHC 04/05/18 with well compensated filling pressures with mildly to moderately reduced CO.  - Repeat CPX in 5/19 stable from previous Peak VO2: 18.7 (56% predicted peak VO2) VE/VCO2 slope: 34 - ECHO today showed EF 15-  20% RV moderately down, severe MR - He has seen w Dr. Prescott Gum in the past and given previous dissection and aortic root replacement likely not a candidate for VAD here. - He has been seen [facility] for transplant eval but CPX was to good back in 2019. Needs CPX set up ASAP.   Will need to another follow up with [facility].  - NYHA III.  Reds Clip 46%.  Volume status elevated.  Increase torsemide to 60 mg twice a day. Repeat BMET next week.  - Continue Entresto 97/103 bid - Cut back carvedilol 12.5 mg twice a day.   - Continue hydralazine 75 mg bid - No imdur  with Viagra use  - Continue spironolactone 12.5 mg daily.  - ECHO today  - CPX set up for Monday.   2. CAD s/p CABG 2006:  - Cardiolite 11/2015 with evidence for mild reversibility involving the anterolateral wall and a portion of the lateral wall. - We have avoided left heart cath due to dissection and CKD with solitary kidney (lost kidney due to dissection) - Continue Plavix and ASA 81 mg daily.   - Statin intolerance at any dose. Also unable to tolerate Zetia - Refer to Lipid Clinic for possible PCSK-9   3. CKD in setting of solitary kidney s/p dissection - Check BMET   4. HTN:  -Stable. Stressed importance of not over-treating in setting of solitary kidney.  5. H/o Type I aortic dissection: Follows with CVTS.  - He has seen w Dr. Prescott Gum. Given previous dissection and aortic root replacement likely not candidate for VAD. - Echo 04/18/18: aortic  root, ascending aorta, and aortic arch all mildly dilated - repeat ECHO   6. NSVT No PVC on EKG    7. MR  Severe on ECHO today.    Start Date for [facility] HF Trial : 12/16/2019  Any change in the diuretic regimen since last point of contact? 12/31 Give 4 more dose of subcutaneous lasix.  Abdominal skin assessment is without evidence of skin reaction? No  Admitted to hospital since last ED visit? No  Admitted to hospital for heart failure related issues? No  Evaluated in the emergency department for HF related issues? No  Office visit for HF related issues? NO  Sought any additional or new medical treatment for HF related issues?No   Any problems with drug or device?   NYHA Class? III Check BNP, BMET, Mag  Follow up in 2 weeks and plan to repeat CBC, BMET, Mag, BNP   Follow up next week with Dr Haroldine Laws.    Darrick Grinder, NP 10:28 AM   Patient seen and examined with the above-signed Advanced Practice Provider and/or Housestaff. I personally reviewed laboratory data, imaging studies and relevant notes. I independently examined the patient and formulated the important aspects of the plan. I have edited the note to reflect any of my changes or salient  points. I have personally discussed the plan with the patient and/or family.  He is very tenuous and has deteriorated over last 1-2 months. Now NYHA IIIb. Echo reviewed and EF 20% with severe MR.   On exam neck veins are up. Harsh aortic murmur and prominent MR with s3.   I previously discussed VAD with him and VAD team but not felt to be candidate here with aortic graft in setting of previous dissection. Has been seen at [facility] for transplant/VAD considerration but CPX too good.  Will repeat CPX early next week and will likely need RHC too. Once we have those results will d/w [facility] and try to arrange quick f/u for re-evaluation for advanced therapies.   Agree with increasing torsemide.   Time spent 45 minutes discussing above.   Glori Bickers, MD  9:41 PM

## 2019-12-30 NOTE — Telephone Encounter (Signed)
COVID-19 pre-appointment screening questions:   Do you have a history of COVID-19 or a positive test result in the past 7-10 days?  NO  To the best of your knowledge, have you been in close contact with anyone with a confirmed diagnosis of COVID 19?  NO  Have you had any one or more of the following: Fever, chills, cough, shortness of breath (out of the normal for you) or any flu-like symptoms?   NO   Are you experiencing any of the following symptoms that is new or out of usual for you:  . Ear, nose or throat discomfort . Sore throat . Headache . Muscle Pain . Diarrhea . Loss of taste or smell  NO   Reviewed all the following with patient: . Use of hand sanitizer when entering the building . Everyone is required to wear a mask in the building, if you do not have a mask we are happy to provide you with one when you arrive . NO Visitor guidelines   If patient answers YES to any of questions they must change to a virtual visit and place note in comments about symptoms  

## 2019-12-31 ENCOUNTER — Ambulatory Visit (HOSPITAL_COMMUNITY)
Admission: RE | Admit: 2019-12-31 | Discharge: 2019-12-31 | Disposition: A | Discharge status: Home / Self Care | Payer: Medicaid Other | Source: Ambulatory Visit | Attending: Cardiology | Admitting: Cardiology

## 2019-12-31 ENCOUNTER — Other Ambulatory Visit: Payer: Self-pay

## 2019-12-31 ENCOUNTER — Encounter (HOSPITAL_COMMUNITY): Payer: Self-pay

## 2019-12-31 ENCOUNTER — Ambulatory Visit (HOSPITAL_BASED_OUTPATIENT_CLINIC_OR_DEPARTMENT_OTHER)
Admission: RE | Admit: 2019-12-31 | Discharge: 2019-12-31 | Disposition: A | Discharge status: Home / Self Care | Payer: Medicaid Other | Source: Ambulatory Visit | Attending: Internal Medicine | Admitting: Internal Medicine

## 2019-12-31 VITALS — BP 104/80 | HR 64 | Wt 168.0 lb

## 2019-12-31 DIAGNOSIS — I5022 Chronic systolic (congestive) heart failure: Secondary | ICD-10-CM | POA: Insufficient documentation

## 2019-12-31 DIAGNOSIS — I110 Hypertensive heart disease with heart failure: Secondary | ICD-10-CM | POA: Diagnosis not present

## 2019-12-31 DIAGNOSIS — I5023 Acute on chronic systolic (congestive) heart failure: Secondary | ICD-10-CM

## 2019-12-31 DIAGNOSIS — I080 Rheumatic disorders of both mitral and aortic valves: Secondary | ICD-10-CM | POA: Diagnosis not present

## 2019-12-31 DIAGNOSIS — I2510 Atherosclerotic heart disease of native coronary artery without angina pectoris: Secondary | ICD-10-CM | POA: Diagnosis not present

## 2019-12-31 DIAGNOSIS — R079 Chest pain, unspecified: Secondary | ICD-10-CM | POA: Diagnosis not present

## 2019-12-31 LAB — BASIC METABOLIC PANEL
Anion gap: 5 (ref 5–15)
BUN: 24 mg/dL — ABNORMAL HIGH (ref 6–20)
CO2: 31 mmol/L (ref 22–32)
Calcium: 8.4 mg/dL — ABNORMAL LOW (ref 8.9–10.3)
Chloride: 106 mmol/L (ref 98–111)
Creatinine, Ser: 1.69 mg/dL — ABNORMAL HIGH (ref 0.61–1.24)
GFR calc Af Amer: 51 mL/min — ABNORMAL LOW (ref >60)
GFR calc non Af Amer: 44 mL/min — ABNORMAL LOW (ref >60)
Glucose, Bld: 101 mg/dL — ABNORMAL HIGH (ref 70–99)
Potassium: 3.7 mmol/L (ref 3.5–5.1)
Sodium: 142 mmol/L (ref 135–145)

## 2019-12-31 LAB — MAGNESIUM: Magnesium: 2.3 mg/dL (ref 1.7–2.4)

## 2019-12-31 LAB — BRAIN NATRIURETIC PEPTIDE: B Natriuretic Peptide: 2280.9 pg/mL — ABNORMAL HIGH (ref 0.0–100.0)

## 2019-12-31 MED ORDER — CARVEDILOL 12.5 MG PO TABS: 12.5000 mg | ORAL_TABLET | Freq: Two times a day (BID) | ORAL | 6 refills | Status: DC

## 2019-12-31 MED ORDER — TORSEMIDE 20 MG PO TABS: 60.0000 mg | ORAL_TABLET | Freq: Two times a day (BID) | ORAL | 3 refills | Status: DC

## 2019-12-31 NOTE — Progress Notes (Signed)
  Echocardiogram 2D Echocardiogram has been performed.  Coralyn Roselli G Emmalynne Courtney 12/31/2019, 10:18 AM

## 2019-12-31 NOTE — Progress Notes (Signed)
ReDS Vest / Clip - 12/31/19 1042      ReDS Vest / Clip   Station Marker  C    Ruler Value  30    ReDS Value Range  (!) High volume overload    ReDS Actual Value  46

## 2019-12-31 NOTE — Patient Instructions (Addendum)
INCREASE Torsemide to 60 mg (3 tabs) twice daily DECREASE Carvedilol to 12.5 mg, one tab twice daily  We have rescheduled you CPX test to Monday 01/05/2020 @ 9:00am  Please see instruction sheet for details  You will need a pre procedure COVID test    WHEN:  Friday Jan 02, 2020 @ 1:45pm WHERE: [facility]  [place] [id number]  This is a drive thru testing site, you will remain in your car. Be sure to get in the line FOR PROCEDURES Once you have been swabbed you will need to remain home in quarantine until you return for your procedure.    Keep your follow up as scheduled with Dr Haroldine Laws  Do the following things EVERYDAY: 1) Weigh yourself in the morning before breakfast. Write it down and keep it in a log. 2) Take your medicines as prescribed 3) Eat low salt foods--Limit salt (sodium) to 2000 mg per day.  4) Stay as active as you can everyday 5) Limit all fluids for the day to less than 2 liters  At the [facility], you and your health needs are our priority. As part of our continuing mission to provide you with exceptional heart care, we have created designated Provider Care Teams. These Care Teams include your primary Cardiologist (physician) and Advanced Practice Providers (APPs- Physician Assistants and Nurse Practitioners) who all work together to provide you with the care you need, when you need it.   You may see any of the following providers on your designated Care Team at your next follow up: [name] Dr Glori Bickers . Dr Loralie Champagne . Darrick Grinder, NP . Lyda Jester, PA . Audry Riles, PharmD   Please be sure to bring in all your medications bottles to every appointment.

## 2020-01-02 ENCOUNTER — Other Ambulatory Visit (HOSPITAL_COMMUNITY)
Admission: RE | Admit: 2020-01-02 | Discharge: 2020-01-02 | Disposition: A | Discharge status: Home / Self Care | Payer: Medicaid Other | Source: Ambulatory Visit | Attending: Internal Medicine | Admitting: Internal Medicine

## 2020-01-02 DIAGNOSIS — Z20822 Contact with and (suspected) exposure to covid-19: Secondary | ICD-10-CM | POA: Insufficient documentation

## 2020-01-02 DIAGNOSIS — Z01812 Encounter for preprocedural laboratory examination: Secondary | ICD-10-CM | POA: Insufficient documentation

## 2020-01-02 LAB — SARS CORONAVIRUS 2 (TAT 6-24 HRS): SARS Coronavirus 2: NEGATIVE

## 2020-01-05 ENCOUNTER — Encounter: Payer: Self-pay | Admitting: *Deleted

## 2020-01-05 ENCOUNTER — Other Ambulatory Visit: Payer: Self-pay

## 2020-01-05 ENCOUNTER — Encounter (HOSPITAL_COMMUNITY): Payer: Self-pay | Admitting: Internal Medicine

## 2020-01-05 ENCOUNTER — Other Ambulatory Visit (HOSPITAL_COMMUNITY): Payer: Medicaid Other

## 2020-01-05 ENCOUNTER — Ambulatory Visit (HOSPITAL_COMMUNITY)
Admission: RE | Admit: 2020-01-05 | Discharge: 2020-01-05 | Disposition: A | Discharge status: Home / Self Care | Payer: Medicaid Other | Source: Ambulatory Visit | Attending: Internal Medicine | Admitting: Internal Medicine

## 2020-01-05 ENCOUNTER — Ambulatory Visit (HOSPITAL_COMMUNITY): Payer: Medicaid Other

## 2020-01-05 VITALS — BP 96/60 | HR 66 | Wt 168.8 lb

## 2020-01-05 DIAGNOSIS — I340 Nonrheumatic mitral (valve) insufficiency: Secondary | ICD-10-CM

## 2020-01-05 DIAGNOSIS — M109 Gout, unspecified: Secondary | ICD-10-CM | POA: Diagnosis not present

## 2020-01-05 DIAGNOSIS — I5022 Chronic systolic (congestive) heart failure: Secondary | ICD-10-CM

## 2020-01-05 DIAGNOSIS — I472 Ventricular tachycardia: Secondary | ICD-10-CM | POA: Insufficient documentation

## 2020-01-05 DIAGNOSIS — Z79899 Other long term (current) drug therapy: Secondary | ICD-10-CM | POA: Diagnosis not present

## 2020-01-05 DIAGNOSIS — Z85850 Personal history of malignant neoplasm of thyroid: Secondary | ICD-10-CM | POA: Insufficient documentation

## 2020-01-05 DIAGNOSIS — Z7902 Long term (current) use of antithrombotics/antiplatelets: Secondary | ICD-10-CM | POA: Insufficient documentation

## 2020-01-05 DIAGNOSIS — N182 Chronic kidney disease, stage 2 (mild): Secondary | ICD-10-CM

## 2020-01-05 DIAGNOSIS — Z95810 Presence of automatic (implantable) cardiac defibrillator: Secondary | ICD-10-CM | POA: Diagnosis not present

## 2020-01-05 DIAGNOSIS — I130 Hypertensive heart and chronic kidney disease with heart failure and stage 1 through stage 4 chronic kidney disease, or unspecified chronic kidney disease: Secondary | ICD-10-CM | POA: Diagnosis not present

## 2020-01-05 DIAGNOSIS — Z7982 Long term (current) use of aspirin: Secondary | ICD-10-CM | POA: Insufficient documentation

## 2020-01-05 DIAGNOSIS — I255 Ischemic cardiomyopathy: Secondary | ICD-10-CM | POA: Insufficient documentation

## 2020-01-05 DIAGNOSIS — K219 Gastro-esophageal reflux disease without esophagitis: Secondary | ICD-10-CM | POA: Diagnosis not present

## 2020-01-05 DIAGNOSIS — I252 Old myocardial infarction: Secondary | ICD-10-CM | POA: Diagnosis not present

## 2020-01-05 DIAGNOSIS — N189 Chronic kidney disease, unspecified: Secondary | ICD-10-CM | POA: Diagnosis not present

## 2020-01-05 DIAGNOSIS — Q600 Renal agenesis, unilateral: Secondary | ICD-10-CM | POA: Insufficient documentation

## 2020-01-05 DIAGNOSIS — Z006 Encounter for examination for normal comparison and control in clinical research program: Secondary | ICD-10-CM

## 2020-01-05 DIAGNOSIS — Z882 Allergy status to sulfonamides status: Secondary | ICD-10-CM | POA: Diagnosis not present

## 2020-01-05 DIAGNOSIS — I2510 Atherosclerotic heart disease of native coronary artery without angina pectoris: Secondary | ICD-10-CM

## 2020-01-05 DIAGNOSIS — E785 Hyperlipidemia, unspecified: Secondary | ICD-10-CM | POA: Diagnosis not present

## 2020-01-05 DIAGNOSIS — G4730 Sleep apnea, unspecified: Secondary | ICD-10-CM | POA: Insufficient documentation

## 2020-01-05 DIAGNOSIS — Z951 Presence of aortocoronary bypass graft: Secondary | ICD-10-CM | POA: Diagnosis not present

## 2020-01-05 LAB — BASIC METABOLIC PANEL
Anion gap: 9 (ref 5–15)
BUN: 25 mg/dL — ABNORMAL HIGH (ref 6–20)
CO2: 29 mmol/L (ref 22–32)
Calcium: 8.1 mg/dL — ABNORMAL LOW (ref 8.9–10.3)
Chloride: 105 mmol/L (ref 98–111)
Creatinine, Ser: 1.7 mg/dL — ABNORMAL HIGH (ref 0.61–1.24)
GFR calc Af Amer: 51 mL/min — ABNORMAL LOW (ref >60)
GFR calc non Af Amer: 44 mL/min — ABNORMAL LOW (ref >60)
Glucose, Bld: 133 mg/dL — ABNORMAL HIGH (ref 70–99)
Potassium: 4.3 mmol/L (ref 3.5–5.1)
Sodium: 143 mmol/L (ref 135–145)

## 2020-01-05 LAB — CBC
HCT: 41.4 % (ref 39.0–52.0)
Hemoglobin: 12.9 g/dL — ABNORMAL LOW (ref 13.0–17.0)
MCH: 27.6 pg (ref 26.0–34.0)
MCHC: 31.2 g/dL (ref 30.0–36.0)
MCV: 88.7 fL (ref 80.0–100.0)
Platelets: 132 10*3/uL — ABNORMAL LOW (ref 150–400)
RBC: 4.67 MIL/uL (ref 4.22–5.81)
RDW: 14.5 % (ref 11.5–15.5)
WBC: 3.1 10*3/uL — ABNORMAL LOW (ref 4.0–10.5)
nRBC: 0 % (ref 0.0–0.2)

## 2020-01-05 LAB — BRAIN NATRIURETIC PEPTIDE: B Natriuretic Peptide: 2240.1 pg/mL — ABNORMAL HIGH (ref 0.0–100.0)

## 2020-01-05 NOTE — H&P (View-Only) (Signed)
Advanced Heart Failure Clinic Note   Patient ID: Chad Bark., male   DOB: 1961/10/19, 59 y.o.   MRN: [id number]   Primary Cardiologist:  Dr. Glori Bickers  History of Present Illness: Chad [name] is a 59 y.o. male with history of severe HTN, coronary artery disease status post previous myocardial infarction and bypass surgery in 2006 also DES to native PDA in 2011.  He also has a history congestive heart failure secondary to ischemic cardiomyopathy EF 20-25%   He is s/p single chamber [employer] ICD.  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 Also with significant low back pain s/p 2 surgeries.   He has had episodes of CP but have avoided cath due to previous dissection and solitary kidney.  Myoview 11/2015  1. Evidence for very mild reversibility and ischemia involving the anterolateral wall and a portion of the lateral wall. 2. Large infarct involving the inferior wall. There is dyskinesia in the inferior wall. 3. Left ventricular ejection fraction is 32%. Left ventricle dilatation.  In 11/17 admitted for ICD shocks due to fractured RV lead. Underwent lead extraction and replacement.   We saw him in 2/18 for acute visit due to increasing dyspnea and volume overload. ECHO EF 20-25% moderate RV dysfunction. Moderate AI and severe MR. Ao Root 59mm-> 54mm. In 4/18, he saw Dr. 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 He was referred to [facility] Transplant team for further evaluation in 6/18 and felt to be too early for transplant based on CPX.  Admitted 5/6-05/01/18 with chest pain. Troponins negative and EKG was unchanged. Chest pain thought to be related to HTN and possibly anxiety. He declined celexa. He was instructed to take extra 50 mg hydralazine for SBP >140 and 1 mg cardura for SBP >160.  Repeat CPX in 5/19 stable from previous Peak VO2: 18.7 (56% predicted peak VO2) VE/VCO2 slope: 34  Echo 12/31/19: EF 15-20% RV moderately down. Severe MR/moderate TR  Here for f/u. Has been struggling with NYHA III-IIIB symptoms. 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 Volume status improved with recent increase of torsemide. Also enrolled in [facility] trial for Furoscix. Denies CP. BP labile. Very fatigued.   CPX 01/05/20 FVC 2.70 (65%)    FEV1 2.09 (64%)     FEV1/FVC 77 (98%)     MVV 105 (71%)     Resting HR: 73 Standing HR: 71 Peak HR: 112  (69% age predicted max HR)  BP rest: 104/68 Standing BP: 92/64 BP peak: 108/60  Peak VO2: 15.8 (48% predicted peak VO2)  VE/VCO2 slope: 34  OUES: 1.40  Peak RER: 0.96  Ventilatory Threshold: 12.9 (39% predicted or measured peak VO2)  VE/MVV: 33%  O2pulse: 9  (60% predicted O2pulse)    CPX 05/15/18 FVC 2.86 (67%)    FEV1 2.26 (68%)     FEV1/FVC 79 (100%)     MVV 106 (70%)  Resting HR: 71 Peak HR: 110  (67% age predicted max HR) BP rest: 100/64 BP peak: 118/66 Peak VO2: 18.7 (56% predicted peak VO2) VE/VCO2 slope: 34 OUES:  1.45 Peak RER: 1.04 Ventilatory Threshold: 11.8 (35% predicted and 63% measured peak VO2) VE/MVV: 43% O2pulse: 12  (80% predicted O2pulse)  CPX 4/18  FVC 3.11 (73%)    FEV1 2.40 (71%)     FEV1/FVC 77 (97%)     MVV 117 (77%) Resting HR: 75 Peak HR: 134  (81% age predicted max HR) BP rest: 120/68 BP peak: 146/82 Peak VO2: 18.6 (55% predicted peak VO2) VE/VCO2 slope: 31 OUES: 2.01 Peak RER: 1.05 Ventilatory Threshold: 16.0 (47% predicted or measured peak VO2) VE/MVV: 49% O2pulse: 13  (76% predicted  O2pulse)  04/18/2018 20-25%.  3/18: ECHO EF 20-25% Moderate AI, moderate to severe MR, moderate TR. Ao Root 50cm 10/22/2015: ECHO EF 20-25% 11/26/12 ECHO EF 35-40%  Review of systems complete and found to be negative unless listed in HPI.   Past Medical History:  Diagnosis Date  . AICD (automatic cardioverter/defibrillator) present   . Anemia   . Anginal pain ([place])   . Anxiety   . Aortic dissection, thoracoabdominal ([place])    7/10: Type I s/p repair  . Arthritis   . Asthma   . CAD (coronary artery disease)    a. s/p CABG 2006;  b. DES to PDA 2011 (cath: Dx not seen, dRCA/PDA tx with DES; S-PDA occluded (culprit), S-Dx occluded, S-RI and OM ok, L-LAD ok  . Carotid stenosis    dopplers 2011: 0-39% bilat.  . Chest pain syndrome   . CHF (congestive heart failure) ([place])   . Chronic bronchitis ([place])   . Chronic lower back pain   . Chronic systolic heart failure ([facility])    a. 12/13 ECHO: EF 35-40%, sept, apical & posterobasal HK, LV mod dil & sys fx mod reduced, mild AI, MV mild reg, TV mild reg  . Complication of anesthesia    "difficult to wake afterwards a couple of times" and patient states he has woken during surgery   . 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 Dyspnea    patient denies at 12/06/2018 appt   . Family history of adverse reaction to anesthesia    "sister hard to wake up"  . GERD (gastroesophageal reflux disease)   . Gout   . Heart murmur   . HLD (hyperlipidemia)   . HTN (hypertension)    severe  . Myocardial infarction [facility])    "many" (11/07/2016)  . PONV (postoperative nausea and vomiting)   . Sleep apnea    does not use mask   . Thyroid cancer ([place])    Hertle Cell    Current Outpatient Medications  Medication Sig Dispense Refill  . albuterol (PROVENTIL HFA;VENTOLIN HFA) 108 (90 Base) MCG/ACT inhaler Inhale 2 puffs into the lungs every 6 (six) hours as needed for wheezing or shortness of breath. 1  Inhaler 0  . Alirocumab (PRALUENT) 75 MG/ML SOAJ Inject 1 pen into the skin every 14 (fourteen) days. 2 pen 11  . allopurinol (ZYLOPRIM) 100 MG tablet Take 0.5 tablets (50 mg total) by mouth daily. 45 tablet 3  . aspirin EC 81 MG tablet Take 1 tablet (81 mg total) by mouth daily. Can take 4 tabs (324 mg total) as needed for chest pain. DO NOT TAKE multiple 325 mg tabs. 40 tablet 6  . B Complex-C (SUPER B COMPLEX PO) Take 3 tablets by mouth daily.     . carvedilol (COREG) 12.5 MG tablet Take 1 tablet (12.5 mg total) by mouth 2 (  two) times daily with a meal. 60 tablet 6  . clopidogrel (PLAVIX) 75 MG tablet Take 1 tablet (75 mg total) by mouth daily. 90 tablet 3  . cyclobenzaprine (FLEXERIL) 10 MG tablet Take 10 mg by mouth 3 (three) times daily as needed for muscle spasms.    [name] doxazosin (CARDURA) 1 MG tablet Take 1 tablet (1 mg total) by mouth every 12 (twelve) hours as needed (for systolic BP > 0000000 mm Hg.). 90 tablet 3  . fluticasone (FLONASE) 50 MCG/ACT nasal spray Place 2 sprays into both nostrils daily as needed for allergies or rhinitis.     . hydrALAZINE (APRESOLINE) 50 MG tablet TAKE 1 & 1/2 (ONE & ONE-HALF) TABLETS BY MOUTH TWICE DAILY (Patient taking differently: Take 75 mg by mouth every 6 (six) hours. ) 270 tablet 3  . ipratropium-albuterol (DUONEB) 0.5-2.5 (3) MG/3ML SOLN Take 3 mLs by nebulization every 6 (six) hours as needed. 360 mL 3  . MITIGARE 0.6 MG CAPS Take 2 capsules by mouth daily. 180 capsule 2  . nitroGLYCERIN (NITROSTAT) 0.4 MG SL tablet DISSOLVE ONE TABLET UNDER THE TONGUE EVERY 5 MINUTES FOR 3 DOSES AS NEEDED FOR CHEST PAIN. 25 tablet 0  . Oxycodone HCl 10 MG TABS Take 20 mg by mouth every 8 (eight) hours.     . potassium chloride SA (KLOR-CON) 20 MEQ tablet Take 2 tablets (40 mEq total) by mouth 2 (two) times daily. 120 tablet 6  . sacubitril-valsartan (ENTRESTO) 97-103 MG Take 1 tablet by mouth 2 (two) times daily. 180 tablet 3  . sildenafil (VIAGRA) 100 MG tablet TAKE  1/2 (ONE-HALF) TABLET BY MOUTH ONCE DAILY AS NEEDED FOR ERECTILE DYSFUNCTION 30 tablet 3  . spironolactone (ALDACTONE) 25 MG tablet Take 0.5 tablets (12.5 mg total) by mouth daily. 45 tablet 3  . torsemide (DEMADEX) 20 MG tablet Take 3 tablets (60 mg total) by mouth 2 (two) times daily. 180 tablet 3  . ursodiol (ACTIGALL) 500 MG tablet Take 1 tablet by mouth twice daily 60 tablet 0   No current facility-administered medications for this encounter.    Allergies  Allergen Reactions  . Contrast Media [Iodinated Diagnostic Agents] Anaphylaxis  . Iohexol Anaphylaxis and Other (See Comments)    PT HAS ANAPHYLAXIS WITH CONTRAST MEDIA!  . Lipitor [Atorvastatin Calcium] Anaphylaxis and Other (See Comments)    Large doses  . Shellfish Allergy Anaphylaxis  . Sulfa Antibiotics Shortness Of Breath and Swelling  . Sulfonamide Derivatives Shortness Of Breath and Swelling  . Almond Oil Itching    FACIAL/MOUTH ITCHING  . Metrizamide Swelling    SWELLING REACTION UNSPECIFIED   . Zocor [Simvastatin] Other (See Comments)    Muscle cramps  . Latex Rash and Other (See Comments)    With long periods of exposure  . Peach [Prunus Persica] Itching    Vital Signs: Vitals:   01/05/20 1523  BP: 96/60  Pulse: 66  SpO2: 99%  Weight: 76.6 kg (168 lb 12.8 oz)   Filed Weights   01/05/20 1523  Weight: 76.6 kg (168 lb 12.8 oz)    Wt Readings from Last 3 Encounters:  01/05/20 76.6 kg (168 lb 12.8 oz)  12/31/19 76.2 kg (168 lb)  12/18/19 76.3 kg (168 lb 3.2 oz)   Physical Exam: General:  Fatigued appearing. No resp difficulty HEENT: normal Neck: supple. JVP 9-10 with prominent CV waves  Carotids 2+ bilat; no bruits. No lymphadenopathy or thryomegaly appreciated. Cor: PMI nondisplaced. Regular rate & rhythm. Harsh 3/6 MR Lungs:  clear Abdomen: soft, nontender, nondistended. No hepatosplenomegaly. No bruits or masses. Good bowel sounds. Extremities: no cyanosis, clubbing, rash, edema Neuro: alert &  orientedx3, cranial nerves grossly intact. moves all 4 extremities w/o difficulty. Affect pleasant   EKG: NSR 71 bpm LVH with repol + PVCs Personally reviewed    ASSESSMENT/PLAN:  1. Chronic systolic HF: Ischemic cardiomyopathy.  Echo (2/16) with EF 25%. S/p [employer] ICD. Echo 3/18. EF 20-25%. Echo 04/18/18: EF 20-25%, grade 2 DD\ - RHC 04/05/18 with well compensated filling pressures with mildly to moderately reduced CO.  - Repeat CPX in 5/19 stable from previous Peak VO2: 18.7 (56% predicted peak VO2) VE/VCO2 slope: 34 - ECHO  12/31/19 showed EF 15- 20% RV moderately down, severe MR - CPX today (01/05/20) with pVO2: 15.8 (48% predicted peak VO2), slope 34 RER 0.96 BP rest: 104/68 Standing BP: 92/64 BP peak: 108/60  VE/VCO2 slope: 34Resting HR: 73 Standing HR: 71 Peak HR: 112  (69% age predicted max HR)  - He has seen w Dr. Prescott Gum in the past and given previous dissection and aortic root replacement likely not a candidate for VAD here. - He has been seen previously at [facility] for transplant eval but CPX was to good back in 2019.  - He has deteriorated now NYHA IIIb. CPX worse - Volume status about as good as we can get it. - Continue Entresto 97/103 bid - Continue carvedilol 12.5 mg twice a day. (recently cut back due to progressive HF symptoms)   - Continue hydralazine 75 mg bid - Continue spironolactone 12.5 mg daily. - No imdur  with Viagra use  - Long talk today about his situation. Now with progressive NYHA IIIB symptoms with worsening echo and CPX test. I think he needs advanced therapies however this is complicated by biventricular dysfunction, CKD 3b with solitary kidney and previous sternotomy x 2 with chronic dissection. Plan RHC. I will d/w Dr. Mosetta Pigeon.   2. CAD s/p CABG 2006:  - Cardiolite 11/2015 with evidence for mild reversibility involving the anterolateral wall and a portion of the lateral wall. - We have avoided left heart cath due to dissection and CKD with  solitary kidney (lost kidney due to dissection) - Continue Plavix and ASA 81 mg daily.   - Statin intolerance at any dose. Also unable to tolerate Zetia - Referred to [facility]id Clinic for possible PCSK-9   3. CKD in setting of solitary kidney s/p dissection - Check BMET today  4. HTN:  -Stable. Stressed importance of not over-treating in setting of solitary kidney.  5. H/o Type I aortic dissection: Follows with CVTS.  - He has seen w Dr. Prescott Gum. Given previous dissection and aortic root replacement likely not candidate for VAD. - Echo 04/18/18: aortic root, ascending aorta, and aortic arch all mildly dilated - repeat ECHO   6. NSVT No PVC on EKG    7. MR  Severe on ECHO  Total time spent 35 minutes. Over half that time spent discussing above.   Glori Bickers, MD 3:44 PM

## 2020-01-05 NOTE — Progress Notes (Signed)
Advanced Heart Failure Clinic Note   Patient ID: Chad Bark., male   DOB: November 19, 1961, 59 y.o.   MRN: [id number]   Primary Cardiologist:  Dr. Glori Bickers  History of Present Illness: Chad [name] is a 59 y.o. male with history of severe HTN, coronary artery disease status post previous myocardial infarction and bypass surgery in 2006 also DES to native PDA in 2011.  He also has a history congestive heart failure secondary to ischemic cardiomyopathy EF 20-25%   He is s/p single chamber [employer] ICD.  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 Also with significant low back pain s/p 2 surgeries.   He has had episodes of CP but have avoided cath due to previous dissection and solitary kidney.  Myoview 11/2015  1. Evidence for very mild reversibility and ischemia involving the anterolateral wall and a portion of the lateral wall. 2. Large infarct involving the inferior wall. There is dyskinesia in the inferior wall. 3. Left ventricular ejection fraction is 32%. Left ventricle dilatation.  In 11/17 admitted for ICD shocks due to fractured RV lead. Underwent lead extraction and replacement.   We saw him in 2/18 for acute visit due to increasing dyspnea and volume overload. ECHO EF 20-25% moderate RV dysfunction. Moderate AI and severe MR. Ao Root 32mm-> 26mm. In 4/18, he saw Dr. 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 He was referred to [facility] Transplant team for further evaluation in 6/18 and felt to be too early for transplant based on CPX.  Admitted 5/6-05/01/18 with chest pain. Troponins negative and EKG was unchanged. Chest pain thought to be related to HTN and possibly anxiety. He declined celexa. He was instructed to take extra 50 mg hydralazine for SBP >140 and 1 mg cardura for SBP >160.  Repeat CPX in 5/19 stable from previous Peak VO2: 18.7 (56% predicted peak VO2) VE/VCO2 slope: 34  Echo 12/31/19: EF 15-20% RV moderately down. Severe MR/moderate TR  Here for f/u. Has been struggling with NYHA III-IIIB symptoms. 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 Volume status improved with recent increase of torsemide. Also enrolled in [facility]-HF trial for Furoscix. Denies CP. BP labile. Very fatigued.   CPX 01/05/20 FVC 2.70 (65%)    FEV1 2.09 (64%)     FEV1/FVC 77 (98%)     MVV 105 (71%)     Resting HR: 73 Standing HR: 71 Peak HR: 112  (69% age predicted max HR)  BP rest: 104/68 Standing BP: 92/64 BP peak: 108/60  Peak VO2: 15.8 (48% predicted peak VO2)  VE/VCO2 slope: 34  OUES: 1.40  Peak RER: 0.96  Ventilatory Threshold: 12.9 (39% predicted or measured peak VO2)  VE/MVV: 33%  O2pulse: 9  (60% predicted O2pulse)    CPX 05/15/18 FVC 2.86 (67%)    FEV1 2.26 (68%)     FEV1/FVC 79 (100%)     MVV 106 (70%)  Resting HR: 71 Peak HR: 110  (67% age predicted max HR) BP rest: 100/64 BP peak: 118/66 Peak VO2: 18.7 (56% predicted peak VO2) VE/VCO2 slope: 34 OUES:  1.45 Peak RER: 1.04 Ventilatory Threshold: 11.8 (35% predicted and 63% measured peak VO2) VE/MVV: 43% O2pulse: 12  (80% predicted O2pulse)  CPX 4/18  FVC 3.11 (73%)    FEV1 2.40 (71%)     FEV1/FVC 77 (97%)     MVV 117 (77%) Resting HR: 75 Peak HR: 134  (81% age predicted max HR) BP rest: 120/68 BP peak: 146/82 Peak VO2: 18.6 (55% predicted peak VO2) VE/VCO2 slope: 31 OUES: 2.01 Peak RER: 1.05 Ventilatory Threshold: 16.0 (47% predicted or measured peak VO2) VE/MVV: 49% O2pulse: 13  (76% predicted  O2pulse)  04/18/2018 20-25%.  3/18: ECHO EF 20-25% Moderate AI, moderate to severe MR, moderate TR. Ao Root 50cm 10/22/2015: ECHO EF 20-25% 11/26/12 ECHO EF 35-40%  Review of systems complete and found to be negative unless listed in HPI.   Past Medical History:  Diagnosis Date  . AICD (automatic cardioverter/defibrillator) present   . Anemia   . Anginal pain ([place])   . Anxiety   . Aortic dissection, thoracoabdominal ([place])    7/10: Type I s/p repair  . Arthritis   . Asthma   . CAD (coronary artery disease)    a. s/p CABG 2006;  b. DES to PDA 2011 (cath: Dx not seen, dRCA/PDA tx with DES; S-PDA occluded (culprit), S-Dx occluded, S-RI and OM ok, L-LAD ok  . Carotid stenosis    dopplers 2011: 0-39% bilat.  . Chest pain syndrome   . CHF (congestive heart failure) ([place])   . Chronic bronchitis ([place])   . Chronic lower back pain   . Chronic systolic heart failure (H[facility])    a. 12/13 ECHO: EF 35-40%, sept, apical & posterobasal HK, LV mod dil & sys fx mod reduced, mild AI, MV mild reg, TV mild reg  . Complication of anesthesia    "difficult to wake afterwards a couple of times" and patient states he has woken during surgery   . 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 Dyspnea    patient denies at 12/06/2018 appt   . Family history of adverse reaction to anesthesia    "sister hard to wake up"  . GERD (gastroesophageal reflux disease)   . Gout   . Heart murmur   . HLD (hyperlipidemia)   . HTN (hypertension)    severe  . Myocardial infarction [facility])    "many" (11/07/2016)  . PONV (postoperative nausea and vomiting)   . Sleep apnea    does not use mask   . Thyroid cancer ([place])    Hertle Cell    Current Outpatient Medications  Medication Sig Dispense Refill  . albuterol (PROVENTIL HFA;VENTOLIN HFA) 108 (90 Base) MCG/ACT inhaler Inhale 2 puffs into the lungs every 6 (six) hours as needed for wheezing or shortness of breath. 1  Inhaler 0  . Alirocumab (PRALUENT) 75 MG/ML SOAJ Inject 1 pen into the skin every 14 (fourteen) days. 2 pen 11  . allopurinol (ZYLOPRIM) 100 MG tablet Take 0.5 tablets (50 mg total) by mouth daily. 45 tablet 3  . aspirin EC 81 MG tablet Take 1 tablet (81 mg total) by mouth daily. Can take 4 tabs (324 mg total) as needed for chest pain. DO NOT TAKE multiple 325 mg tabs. 40 tablet 6  . B Complex-C (SUPER B COMPLEX PO) Take 3 tablets by mouth daily.     . carvedilol (COREG) 12.5 MG tablet Take 1 tablet (12.5 mg total) by mouth 2 (  two) times daily with a meal. 60 tablet 6  . clopidogrel (PLAVIX) 75 MG tablet Take 1 tablet (75 mg total) by mouth daily. 90 tablet 3  . cyclobenzaprine (FLEXERIL) 10 MG tablet Take 10 mg by mouth 3 (three) times daily as needed for muscle spasms.    [name] doxazosin (CARDURA) 1 MG tablet Take 1 tablet (1 mg total) by mouth every 12 (twelve) hours as needed (for systolic BP > 0000000 mm Hg.). 90 tablet 3  . fluticasone (FLONASE) 50 MCG/ACT nasal spray Place 2 sprays into both nostrils daily as needed for allergies or rhinitis.     . hydrALAZINE (APRESOLINE) 50 MG tablet TAKE 1 & 1/2 (ONE & ONE-HALF) TABLETS BY MOUTH TWICE DAILY (Patient taking differently: Take 75 mg by mouth every 6 (six) hours. ) 270 tablet 3  . ipratropium-albuterol (DUONEB) 0.5-2.5 (3) MG/3ML SOLN Take 3 mLs by nebulization every 6 (six) hours as needed. 360 mL 3  . MITIGARE 0.6 MG CAPS Take 2 capsules by mouth daily. 180 capsule 2  . nitroGLYCERIN (NITROSTAT) 0.4 MG SL tablet DISSOLVE ONE TABLET UNDER THE TONGUE EVERY 5 MINUTES FOR 3 DOSES AS NEEDED FOR CHEST PAIN. 25 tablet 0  . Oxycodone HCl 10 MG TABS Take 20 mg by mouth every 8 (eight) hours.     . potassium chloride SA (KLOR-CON) 20 MEQ tablet Take 2 tablets (40 mEq total) by mouth 2 (two) times daily. 120 tablet 6  . sacubitril-valsartan (ENTRESTO) 97-103 MG Take 1 tablet by mouth 2 (two) times daily. 180 tablet 3  . sildenafil (VIAGRA) 100 MG tablet TAKE  1/2 (ONE-HALF) TABLET BY MOUTH ONCE DAILY AS NEEDED FOR ERECTILE DYSFUNCTION 30 tablet 3  . spironolactone (ALDACTONE) 25 MG tablet Take 0.5 tablets (12.5 mg total) by mouth daily. 45 tablet 3  . torsemide (DEMADEX) 20 MG tablet Take 3 tablets (60 mg total) by mouth 2 (two) times daily. 180 tablet 3  . ursodiol (ACTIGALL) 500 MG tablet Take 1 tablet by mouth twice daily 60 tablet 0   No current facility-administered medications for this encounter.    Allergies  Allergen Reactions  . Contrast Media [Iodinated Diagnostic Agents] Anaphylaxis  . Iohexol Anaphylaxis and Other (See Comments)    PT HAS ANAPHYLAXIS WITH CONTRAST MEDIA!  . Lipitor [Atorvastatin Calcium] Anaphylaxis and Other (See Comments)    Large doses  . Shellfish Allergy Anaphylaxis  . Sulfa Antibiotics Shortness Of Breath and Swelling  . Sulfonamide Derivatives Shortness Of Breath and Swelling  . Almond Oil Itching    FACIAL/MOUTH ITCHING  . Metrizamide Swelling    SWELLING REACTION UNSPECIFIED   . Zocor [Simvastatin] Other (See Comments)    Muscle cramps  . Latex Rash and Other (See Comments)    With long periods of exposure  . Peach [Prunus Persica] Itching    Vital Signs: Vitals:   01/05/20 1523  BP: 96/60  Pulse: 66  SpO2: 99%  Weight: 76.6 kg (168 lb 12.8 oz)   Filed Weights   01/05/20 1523  Weight: 76.6 kg (168 lb 12.8 oz)    Wt Readings from Last 3 Encounters:  01/05/20 76.6 kg (168 lb 12.8 oz)  12/31/19 76.2 kg (168 lb)  12/18/19 76.3 kg (168 lb 3.2 oz)   Physical Exam: General:  Fatigued appearing. No resp difficulty HEENT: normal Neck: supple. JVP 9-10 with prominent CV waves  Carotids 2+ bilat; no bruits. No lymphadenopathy or thryomegaly appreciated. Cor: PMI nondisplaced. Regular rate & rhythm. Harsh 3/6 MR Lungs:  clear Abdomen: soft, nontender, nondistended. No hepatosplenomegaly. No bruits or masses. Good bowel sounds. Extremities: no cyanosis, clubbing, rash, edema Neuro: alert &  orientedx3, cranial nerves grossly intact. moves all 4 extremities w/o difficulty. Affect pleasant   EKG: NSR 71 bpm LVH with repol + PVCs Personally reviewed    ASSESSMENT/PLAN:  1. Chronic systolic HF: Ischemic cardiomyopathy.  Echo (2/16) with EF 25%. S/p [employer] ICD. Echo 3/18. EF 20-25%. Echo 04/18/18: EF 20-25%, grade 2 DD\ - RHC 04/05/18 with well compensated filling pressures with mildly to moderately reduced CO.  - Repeat CPX in 5/19 stable from previous Peak VO2: 18.7 (56% predicted peak VO2) VE/VCO2 slope: 34 - ECHO  12/31/19 showed EF 15- 20% RV moderately down, severe MR - CPX today (01/05/20) with pVO2: 15.8 (48% predicted peak VO2), slope 34 RER 0.96 BP rest: 104/68 Standing BP: 92/64 BP peak: 108/60  VE/VCO2 slope: 34Resting HR: 73 Standing HR: 71 Peak HR: 112  (69% age predicted max HR)  - He has seen w Dr. Prescott Gum in the past and given previous dissection and aortic root replacement likely not a candidate for VAD here. - He has been seen [facility] at [facility] for transplant eval but CPX was to good back in 2019.  - He has deteriorated now NYHA IIIb. CPX worse - Volume status about as good as we can get it. - Continue Entresto 97/103 bid - Continue carvedilol 12.5 mg twice a day. (recently cut back due to progressive HF symptoms)   - Continue hydralazine 75 mg bid - Continue spironolactone 12.5 mg daily. - No imdur  with Viagra use  - Long talk today about his situation. Now with progressive NYHA IIIB symptoms with worsening echo and CPX test. I think he needs advanced therapies however this is complicated by biventricular dysfunction, CKD 3b with solitary kidney and previous sternotomy x 2 with chronic dissection. Plan RHC. I will d/w Dr. Mosetta Pigeon.   2. CAD s/p CABG 2006:  - Cardiolite 11/2015 with evidence for mild reversibility involving the anterolateral wall and a portion of the lateral wall. - We have avoided left heart cath due to dissection and CKD with  solitary kidney (lost kidney due to dissection) - Continue Plavix and ASA 81 mg daily.   - Statin intolerance at any dose. Also unable to tolerate Zetia - Referred to Lipid Clinic for possible PCSK-9   3. CKD in setting of solitary kidney s/p dissection - Check BMET today  4. HTN:  -Stable. Stressed importance of not over-treating in setting of solitary kidney.  5. H/o Type I aortic dissection: Follows with CVTS.  - He has seen w Dr. Prescott Gum. Given previous dissection and aortic root replacement likely not candidate for VAD. - Echo 04/18/18: aortic root, ascending aorta, and aortic arch all mildly dilated - repeat ECHO   6. NSVT No PVC on EKG    7. MR  Severe on ECHO  Total time spent 35 minutes. Over half that time spent discussing above.   Glori Bickers, MD 3:44 PM

## 2020-01-05 NOTE — Patient Instructions (Addendum)
Labs today We will only contact you if something comes back abnormal or we need to make some changes. Otherwise no news is good news!    Your physician recommends that you schedule a follow-up appointment in: 1 month with Dr Haroldine Laws   [facility] [place] [id number] [place] [address] Dept: [phone] Loc: [phone]  Chad [name].  01/05/2020  You are scheduled for a Cardiac Catheterization on Monday, January 25 with Dr. Glori Bickers.  1. Please arrive at the [facility] (Main Entrance A) at [facility]: [address] [place], [place] [address] at 5:30 AM (This time is two hours before your procedure to ensure your preparation). Free valet parking service is available.   Special note: Every effort is made to have your procedure done on time. Please understand that emergencies sometimes delay scheduled procedures.  2. Diet: Do not eat solid foods or drink liquids after midnight.  3. COVID TESTING:   You will need a pre procedure COVID test          WHEN:  Friday, January 22nd, 2021 anytime between 9am-3pm       WHERE: [facility]     [place] [address]   This is a drive thru testing site, you will remain in your car. Be sure to get in the line FOR  PROCEDURES.  Once you have been swabbed you will need to remain home in quarantine until  you return for your procedure.   4. Medication instructions in preparation for your procedure:  On the morning of your procedure, take your Aspirin and Plavix and any morning medicines NOT listed above.  You may use sips of water.  HOLD ALL OTHER MEDICATIONS   5. Plan for one night stay--bring personal belongings. 6. Bring a current list of your medications and current insurance cards. 7. You MUST have a responsible person to drive you home. 8. Someone MUST be with you the first 24 hours after you  arrive home or your discharge will be delayed. 9. Please wear clothes that are easy to get on and off and wear slip-on shoes.  Thank you for allowing [place] to care for you!   -- [facility]

## 2020-01-05 NOTE — Research (Addendum)
Freedom Day 14-21 phone call visit    Date of Phone Call (DD/MON/YYYY): 12/31/2019    Phone Call not performed: [] YES   []  NO   Did the subject experience any problems with drug or device use since last point of contact? [] YES   [x]  NO   Has the subject been prescribed with an additional dose of treatment since last point of contact ? [] YES   [x]  NO   Weight: 168   Unit: []  Kilograms  [x] Pounds   Weight not done: []    Have you experienced any shortness of breath? [x] YES   []  NO  If Yes  Was it present at rest?  [] Yes  [x] No  If Yes, Did it get worse with activities?  [x] Yes  [] No  Do you get short of breath when you lie down at night?  [] Yes  [x] No  If Yes, is it the same, worse or better than the previous night?  [] Same  [] Worse  [] Better    Have you noticed swelling of your ankles/legs or abdomen? [] YES   [x]  NO  If Yes [] Same  [] Worse  [] Better   Last night, did you have difficulty sleeping? [] YES   [x]  NO  If Yes [] Same  [] Worse  [] Better   Did you have any other symptoms from your heart failure that is different than the previous day?  [] YES   [x]  NO  If yes, please explain    Did the subject start any new medications/supplements or have any medications /supplements change since last point of contact? [] YES   [x]  NO   Did the subject have any hospital visits, emergency department visits or unscheduled clinic visits since last point of contact? [] YES   [x]  NO   Did the subject experience any Adverse Event since last point of contact? [] YES   [x]  NO   Did the subject experience any problems with drug or device use since last point of contact? [] YES   [x]  NO   Has the subject been prescribed with an additional dose of treatment since last point of contact? [] YES   [x]  NO

## 2020-01-07 ENCOUNTER — Other Ambulatory Visit (HOSPITAL_COMMUNITY): Payer: Self-pay

## 2020-01-07 DIAGNOSIS — I5042 Chronic combined systolic (congestive) and diastolic (congestive) heart failure: Secondary | ICD-10-CM

## 2020-01-07 MED ORDER — SODIUM CHLORIDE 0.9% FLUSH: 3.0000 mL | Freq: Two times a day (BID) | INTRAVENOUS | Status: DC

## 2020-01-15 ENCOUNTER — Other Ambulatory Visit: Payer: Self-pay

## 2020-01-15 ENCOUNTER — Ambulatory Visit (HOSPITAL_COMMUNITY)
Admission: RE | Admit: 2020-01-15 | Discharge: 2020-01-15 | Disposition: A | Discharge status: Home / Self Care | Payer: Medicaid Other | Source: Ambulatory Visit | Attending: Cardiology | Admitting: Cardiology

## 2020-01-15 ENCOUNTER — Encounter: Payer: Medicaid Other | Admitting: *Deleted

## 2020-01-15 ENCOUNTER — Encounter (HOSPITAL_COMMUNITY): Payer: Self-pay

## 2020-01-15 VITALS — BP 112/64 | HR 59 | Wt 165.6 lb

## 2020-01-15 DIAGNOSIS — I5022 Chronic systolic (congestive) heart failure: Secondary | ICD-10-CM

## 2020-01-15 DIAGNOSIS — Z006 Encounter for examination for normal comparison and control in clinical research program: Secondary | ICD-10-CM

## 2020-01-15 LAB — CBC
HCT: 43.5 % (ref 39.0–52.0)
Hemoglobin: 14.1 g/dL (ref 13.0–17.0)
MCH: 27.9 pg (ref 26.0–34.0)
MCHC: 32.4 g/dL (ref 30.0–36.0)
MCV: 86 fL (ref 80.0–100.0)
Platelets: 124 10*3/uL — ABNORMAL LOW (ref 150–400)
RBC: 5.06 MIL/uL (ref 4.22–5.81)
RDW: 14.1 % (ref 11.5–15.5)
WBC: 3 10*3/uL — ABNORMAL LOW (ref 4.0–10.5)
nRBC: 0 % (ref 0.0–0.2)

## 2020-01-15 LAB — BASIC METABOLIC PANEL
Anion gap: 9 (ref 5–15)
BUN: 23 mg/dL — ABNORMAL HIGH (ref 6–20)
CO2: 27 mmol/L (ref 22–32)
Calcium: 8.3 mg/dL — ABNORMAL LOW (ref 8.9–10.3)
Chloride: 105 mmol/L (ref 98–111)
Creatinine, Ser: 1.61 mg/dL — ABNORMAL HIGH (ref 0.61–1.24)
GFR calc Af Amer: 54 mL/min — ABNORMAL LOW (ref >60)
GFR calc non Af Amer: 47 mL/min — ABNORMAL LOW (ref >60)
Glucose, Bld: 102 mg/dL — ABNORMAL HIGH (ref 70–99)
Potassium: 3.7 mmol/L (ref 3.5–5.1)
Sodium: 141 mmol/L (ref 135–145)

## 2020-01-15 LAB — MAGNESIUM: Magnesium: 2.3 mg/dL (ref 1.7–2.4)

## 2020-01-15 LAB — BRAIN NATRIURETIC PEPTIDE: B Natriuretic Peptide: 1291.2 pg/mL — ABNORMAL HIGH (ref 0.0–100.0)

## 2020-01-15 NOTE — Progress Notes (Signed)
Advanced Heart Failure Clinic Note   Patient ID: Chad Bark., male   DOB: [birth year], 59 y.o.   MRN: [id number]   Primary Cardiologist:  Dr. Glori Bickers  Reason for Visit: 1 Week Freedom Trial   History of Present Illness: Toris is a 59 y.o. male with history of severe HTN, coronary artery disease status post previous myocardial infarction and bypass surgery in 2006 also DES to native PDA in 2011.  He also has a history congestive heart failure secondary to ischemic cardiomyopathy EF 20-25%   He is s/p single chamber [employer] ICD.  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 Also with significant low back pain s/p 2 surgeries.   He has had episodes of CP but have avoided cath due to previous dissection and solitary kidney.  Myoview 11/2015  1. Evidence for very mild reversibility and ischemia involving the anterolateral wall and a portion of the lateral wall. 2. Large infarct involving the inferior wall. There is dyskinesia in the inferior wall. 3. Left ventricular ejection fraction is 32%. Left ventricle dilatation.  In 11/17 admitted for ICD shocks due to fractured RV lead. Underwent lead extraction and replacement.   We saw him in 2/18 for acute visit due to increasing dyspnea and volume overload. ECHO EF 20-25% moderate RV dysfunction. Moderate AI and severe MR. Ao Root 36mm-> 5mm. In 4/18, he saw Dr. 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 He was referred to [facility] Transplant team for further evaluation in 6/18 and felt to  be too early for transplant based on CPX.  Admitted 5/6-05/01/18 with chest pain. Troponins negative and EKG was unchanged. Chest pain thought to be related to HTN and possibly anxiety. He declined celexa. He was instructed to take extra 50 mg hydralazine for SBP >140 and 1 mg cardura for SBP >160.  Repeat CPX in 5/19 stable from previous Peak VO2: 18.7 (56% predicted peak VO2) VE/VCO2 slope: 34  Echo 12/31/19: EF 15-20% RV moderately down. Severe MR/moderate TR  Had recent f/u w/ Dr. Haroldine Laws 1/11/ Has been struggling with NYHA III-IIIB symptoms. 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 Volume status was improved at recent f/u visit after increase of torsemide. Also enrolled in Freedom-HF trial for Furoscix. He denied CP but remained very fatigued. Given worsening symptoms + worsening echo and CPX test, felt to now need advanced HF therapies. He is set up for [place] next week for further assessment.   He presents to clinic for his 1 month Freedom Trial Visit. Feel ok. No complaints. Still chronically fatigue. No dyspnea. Wt down 3 lb from previous visit. BP stable.   CPX 01/05/20 FVC 2.70 (65%)    FEV1 2.09 (64%)     FEV1/FVC 77 (98%)     MVV 105 (71%)     Resting HR: 73 Standing HR: 71 Peak HR: 112  (69% age predicted max HR)  BP rest: 104/68 Standing BP: 92/64 BP peak: 108/60  Peak VO2: 15.8 (48% predicted peak VO2)  VE/VCO2 slope: 34  OUES: 1.40  Peak RER: 0.96  Ventilatory Threshold: 12.9 (39% predicted or measured peak VO2)  VE/MVV: 33%  O2pulse: 9  (60% predicted O2pulse)    CPX 05/15/18 FVC 2.86 (67%)    FEV1 2.26 (68%)     FEV1/FVC 79 (100%)     MVV 106 (70%)  Resting HR: 71 Peak HR: 110  (67% age predicted max HR) BP rest: 100/64 BP peak: 118/66 Peak VO2: 18.7 (56% predicted peak VO2) VE/VCO2 slope: 34 OUES: 1.45 Peak RER: 1.04 Ventilatory Threshold: 11.8 (35% predicted and 63% measured peak VO2) VE/MVV: 43% O2pulse: 12  (80%  predicted O2pulse)  CPX 4/18  FVC 3.11 (73%)    FEV1 2.40 (71%)     FEV1/FVC 77 (97%)     MVV 117 (77%) Resting HR: 75 Peak HR: 134  (81% age predicted max HR) BP rest: 120/68 BP peak: 146/82 Peak VO2: 18.6 (55% predicted peak VO2) VE/VCO2 slope: 31 OUES: 2.01 Peak RER: 1.05 Ventilatory Threshold: 16.0 (47% predicted or measured peak VO2) VE/MVV: 49% O2pulse: 13  (76% predicted O2pulse)  04/18/2018 20-25%.  3/18: ECHO EF 20-25% Moderate AI, moderate to severe MR, moderate TR. Ao Root 50cm 10/22/2015: ECHO EF 20-25% 11/26/12 ECHO EF 35-40%  Review of systems complete and found to be negative unless listed in HPI.   Past Medical History:  Diagnosis Date  . AICD (automatic cardioverter/defibrillator) present   . Anemia   . Anginal pain ([place])   . Anxiety   . Aortic dissection, thoracoabdominal ([place])    7/10: Type I s/p repair  . Arthritis   . Asthma   . CAD (coronary artery disease)    a. s/p CABG 2006;  b. DES to PDA 2011 (cath: Dx not seen, dRCA/PDA tx with DES; S-PDA occluded (culprit), S-Dx occluded, S-RI and OM ok, L-LAD ok  . Carotid stenosis    dopplers 2011: 0-39% bilat.  . Chest pain syndrome   . CHF (congestive heart failure) ([place])   . Chronic bronchitis ([place])   . Chronic lower back pain   . Chronic systolic heart failure (HCC)    a. 12/13 ECHO: EF 35-40%, sept, apical & posterobasal HK, LV mod dil & sys fx mod reduced, mild AI, MV mild reg, TV mild reg  . Complication of anesthesia    "difficult to wake afterwards a couple of times" and patient states he has woken during surgery   . 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 Dyspnea    patient denies at 12/06/2018 appt   . Family history of adverse reaction to anesthesia    "sister hard to wake up"  . GERD (gastroesophageal reflux disease)   . Gout   . Heart murmur   . HLD (hyperlipidemia)   . HTN (hypertension)    severe  . Myocardial  infarction [facility])    "many" (11/07/2016)  . PONV (postoperative nausea and vomiting)   . Sleep apnea    does not use mask   . Thyroid cancer ([place])    Hertle Cell    Current Outpatient Medications  Medication Sig Dispense Refill  . allopurinol (ZYLOPRIM) 100 MG tablet Take 0.5 tablets (50 mg total) by mouth daily. (Patient taking differently: Take 50 mg by mouth every evening. ) 45 tablet 3  . aspirin EC 81 MG tablet Take 1 tablet (81 mg total) by mouth daily. Can take 4 tabs (324 mg total) as needed for chest pain. DO NOT TAKE multiple 325 mg tabs. (  Patient taking differently: Take 81 mg by mouth every evening. Can take 4 tabs (324 mg total) as needed for chest pain. DO NOT TAKE multiple 325 mg tabs.) 40 tablet 6  . carvedilol (COREG) 25 MG tablet Take 12.5 mg by mouth 2 (two) times daily with a meal.    . clopidogrel (PLAVIX) 75 MG tablet Take 1 tablet (75 mg total) by mouth daily. (Patient taking differently: Take 75 mg by mouth every evening. ) 90 tablet 3  . cyclobenzaprine (FLEXERIL) 10 MG tablet Take 10 mg by mouth 3 (three) times daily as needed for muscle spasms.    [name] doxazosin (CARDURA) 1 MG tablet Take 1 tablet (1 mg total) by mouth every 12 (twelve) hours as needed (for systolic BP > 0000000 mm Hg.). 90 tablet 3  . hydrALAZINE (APRESOLINE) 50 MG tablet TAKE 1 & 1/2 (ONE & ONE-HALF) TABLETS BY MOUTH TWICE DAILY (Patient taking differently: Take 25 mg by mouth 2 (two) times daily. ) 270 tablet 3  . ipratropium-albuterol (DUONEB) 0.5-2.5 (3) MG/3ML SOLN Take 3 mLs by nebulization every 6 (six) hours as needed. (Patient taking differently: Take 3 mLs by nebulization every 6 (six) hours as needed (wheezing/shortness of breath). ) 360 mL 3  . MITIGARE 0.6 MG CAPS Take 2 capsules by mouth daily. (Patient taking differently: Take 0.6-1.2 mg by mouth 2 (two) times daily as needed (gout flare ups). ) 180 capsule 2  . nitroGLYCERIN (NITROSTAT) 0.4 MG SL tablet DISSOLVE ONE TABLET UNDER THE TONGUE  EVERY 5 MINUTES FOR 3 DOSES AS NEEDED FOR CHEST PAIN. (Patient taking differently: Place 0.4 mg under the tongue every 5 (five) minutes x 3 doses as needed for chest pain. ) 25 tablet 0  . potassium chloride SA (KLOR-CON) 20 MEQ tablet Take 2 tablets (40 mEq total) by mouth 2 (two) times daily. 120 tablet 6  . sacubitril-valsartan (ENTRESTO) 97-103 MG Take 1 tablet by mouth 2 (two) times daily. 180 tablet 3  . sildenafil (VIAGRA) 100 MG tablet TAKE 1/2 (ONE-HALF) TABLET BY MOUTH ONCE DAILY AS NEEDED FOR ERECTILE DYSFUNCTION (Patient taking differently: Take 50 mg by mouth daily as needed for erectile dysfunction. TAKE 1/2 (ONE-HALF) TABLET BY MOUTH ONCE DAILY AS NEEDED FOR ERECTILE DYSFUNCTION) 30 tablet 3  . torsemide (DEMADEX) 20 MG tablet Take 3 tablets (60 mg total) by mouth 2 (two) times daily. 180 tablet 3  . albuterol (PROVENTIL HFA;VENTOLIN HFA) 108 (90 Base) MCG/ACT inhaler Inhale 2 puffs into the lungs every 6 (six) hours as needed for wheezing or shortness of breath. 1 Inhaler 0  . Alirocumab (PRALUENT) 75 MG/ML SOAJ Inject 1 pen into the skin every 14 (fourteen) days. 2 pen 11  . carvedilol (COREG) 12.5 MG tablet Take 1 tablet (12.5 mg total) by mouth 2 (two) times daily with a meal. (Patient not taking: Reported on 01/15/2020) 60 tablet 6  . cyanocobalamin (,VITAMIN B-12,) 1000 MCG/ML injection Inject 1,000 mcg into the muscle every 14 (fourteen) days.    . fluticasone (FLONASE) 50 MCG/ACT nasal spray Place 2 sprays into both nostrils daily as needed for allergies or rhinitis.     . Oxycodone HCl 20 MG TABS Take 20 mg by mouth every 6 (six) hours.      No current facility-administered medications for this encounter.   Facility-Administered Medications Ordered in Other Encounters  Medication Dose Route Frequency Provider Last Rate Last Admin  . sodium chloride flush (NS) 0.9 % injection 3 mL  3 mL Intravenous Q12H Bensimhon,  Shaune Pascal, MD        Allergies  Allergen Reactions  .  Contrast Media [Iodinated Diagnostic Agents] Anaphylaxis  . Iohexol Anaphylaxis and Other (See Comments)    PT HAS ANAPHYLAXIS WITH CONTRAST MEDIA!  . Lipitor [Atorvastatin Calcium] Anaphylaxis and Other (See Comments)    Large doses  . Shellfish Allergy Anaphylaxis  . Sulfa Antibiotics Shortness Of Breath and Swelling  . Sulfonamide Derivatives Shortness Of Breath and Swelling  . Almond Oil Itching    FACIAL/MOUTH ITCHING  . Metrizamide Swelling    SWELLING REACTION UNSPECIFIED   . Zocor [Simvastatin] Other (See Comments)    Muscle cramps  . Food Itching    Shellfish, peaches, almonds, apples & kiwis  . Latex Rash and Other (See Comments)    With long periods of exposure  . Peach [Prunus Persica] Itching    Vital Signs: Vitals:   01/15/20 1019  BP: 112/64  Pulse: (!) 59  SpO2: 100%  Weight: 75.1 kg (165 lb 9.6 oz)   Filed Weights   01/15/20 1019  Weight: 75.1 kg (165 lb 9.6 oz)    Wt Readings from Last 3 Encounters:  01/15/20 75.1 kg (165 lb 9.6 oz)  01/05/20 76.6 kg (168 lb 12.8 oz)  12/31/19 76.2 kg (168 lb)   PHYSICAL EXAM: General:  Thin fatigue appearing AAM. No respiratory difficulty HEENT: normal Neck: supple. no JVD. Carotids 2+ bilat; no bruits. No lymphadenopathy or thyromegaly appreciated. Cor: PMI nondisplaced. Regular rate & rhythm. 3/6 MR murmur  Lungs: clear Abdomen: soft, nontender, nondistended. No hepatosplenomegaly. No bruits or masses. Good bowel sounds. Extremities: no cyanosis, clubbing, rash, edema Neuro: alert & oriented x 3, cranial nerves grossly intact. moves all 4 extremities w/o difficulty. Affect pleasant.    EKG: not performed today    ASSESSMENT/PLAN:  1. Chronic systolic HF: Ischemic cardiomyopathy.  Echo (2/16) with EF 25%. S/p [employer] ICD. Echo 3/18. EF 20-25%. Echo 04/18/18: EF 20-25%, grade 2 DD\ - RHC 04/05/18 with well compensated filling pressures with mildly to moderately reduced CO.  - Repeat CPX in 5/19  stable from previous Peak VO2: 18.7 (56% predicted peak VO2) VE/VCO2 slope: 34 - ECHO  12/31/19 showed EF 15- 20% RV moderately down, severe MR - CPX today (01/05/20) with pVO2: 15.8 (48% predicted peak VO2), slope 34 RER 0.96 BP rest: 104/68 Standing BP: 92/64 BP peak: 108/60  VE/VCO2 slope: 34Resting HR: 73 Standing HR: 71 Peak HR: 112  (69% age predicted max HR)  - He has seen w Dr. Prescott Gum in the past and given previous dissection and aortic root replacement likely not a candidate for VAD here. - He has been seen [facility] for transplant eval but CPX was too good back in 2019.  - He has deteriorated now NYHA IIIb. CPX worse - Volume status about as good as we can get it. Wt down additional 3 lb since last OV.  - Continue Entresto 97/103 bid - Continue carvedilol 12.5 mg twice a day. (recently cut back due to progressive HF symptoms)   - Continue hydralazine 75 mg bid - Continue spironolactone 12.5 mg daily. - No imdur  with Viagra use  - Recent long talk at last visit w/ Dr. Haroldine Laws about his situation. Now with progressive NYHA IIIB symptoms with worsening echo and CPX test. Think he needs advanced therapies however this is complicated by biventricular dysfunction, CKD 3b with solitary kidney and previous sternotomy x 2 with chronic dissection. Plan RHC on  1/25.   2. CAD s/p CABG 2006:  - Cardiolite 11/2015 with evidence for mild reversibility involving the anterolateral wall and a portion of the lateral wall. - We have avoided left heart cath due to dissection and CKD with solitary kidney (lost kidney due to dissection) - Continue Plavix and ASA 81 mg daily.   - Statin intolerance at any dose. Also unable to tolerate Zetia - Referred to Lipid Clinic for possible PCSK-9   3. CKD in setting of solitary kidney s/p dissection - Check BMET today  4. HTN:  - Controlled on current regimen  5. H/o Type I aortic dissection: Follows with CVTS.  - He has seen w Dr. Prescott Gum.  Given previous dissection and aortic root replacement likely not candidate for VAD. - Echo 04/18/18: aortic root, ascending aorta, and aortic arch all mildly dilated - repeat ECHO 12/2019: Ao Root diam: 5.10 cm, Ao Asc diam: 4.30 cm  6. NSVT - in the setting of severe CM - continue Coreg - checking BMP and Mg labs today for Freedom Trial   7. MR  - Severe on ECHO  8. Freedom Trial - 4 week f/u visit   Start Date for FREEDOM HF Trial : 12/16/2019   Any change in the diuretic regimen since last point of contact?  No  Abdominal skin assessment is without evidence of skin reaction? No  Admitted to hospital since last ED visit?  No  Admitted to hospital for heart failure related issues? No  Evaluated in the emergency department for HF related issues? No  Office visit for HF related issues? Yes but routien visit for chronic systolic heart failure (clinic visit 01/05/20) Sought any additional or new medical treatment for HF related issues? No   Any problems with drug or device? No   NYHA Class? NYHA III-IIIB  Check CBC, BMET, Mag, BNP today.    Keep f/u w/ Dr. Haroldine Laws as planned.    Lyda Jester, PA-C 10:25 AM

## 2020-01-15 NOTE — Research (Signed)
[name] was in for his 30 day Freedom HF trial visit. He has had no events to report.

## 2020-01-15 NOTE — Patient Instructions (Addendum)
Lab work done today. We we will notify you of any abnormal lab work. No news is good news!  Please keep follow up appointment in February with Dr. Haroldine Laws.  At the [facility], you and your health needs are our priority. As part of our continuing mission to provide you with exceptional heart care, we have created designated Provider Care Teams. These Care Teams include your primary Cardiologist (physician) and Advanced Practice Providers (APPs- Physician Assistants and Nurse Practitioners) who all work together to provide you with the care you need, when you need it.   You may see any of the following providers on your designated Care Team at your next follow up: [name] Dr Glori Bickers . Dr Loralie Champagne . Darrick Grinder, NP . Lyda Jester, PA . Audry Riles, PharmD   Please be sure to bring in all your medications bottles to every appointment.

## 2020-01-16 ENCOUNTER — Other Ambulatory Visit (HOSPITAL_COMMUNITY)
Admission: RE | Admit: 2020-01-16 | Discharge: 2020-01-16 | Disposition: A | Discharge status: Home / Self Care | Payer: Medicaid Other | Source: Ambulatory Visit | Attending: Internal Medicine | Admitting: Internal Medicine

## 2020-01-16 DIAGNOSIS — G936 Cerebral edema: Secondary | ICD-10-CM | POA: Insufficient documentation

## 2020-01-16 DIAGNOSIS — Z20822 Contact with and (suspected) exposure to covid-19: Secondary | ICD-10-CM | POA: Diagnosis not present

## 2020-01-16 DIAGNOSIS — Z01812 Encounter for preprocedural laboratory examination: Secondary | ICD-10-CM | POA: Diagnosis present

## 2020-01-16 LAB — SARS CORONAVIRUS 2 (TAT 6-24 HRS): SARS Coronavirus 2: NEGATIVE

## 2020-01-19 ENCOUNTER — Ambulatory Visit (HOSPITAL_COMMUNITY)
Admission: RE | Admit: 2020-01-19 | Discharge: 2020-01-19 | Disposition: A | Discharge status: Home / Self Care | Payer: Medicaid Other | Attending: Internal Medicine | Admitting: Internal Medicine

## 2020-01-19 ENCOUNTER — Encounter (HOSPITAL_COMMUNITY)
Admission: RE | Disposition: A | Discharge status: Home / Self Care | Payer: Self-pay | Source: Home / Self Care | Attending: Internal Medicine

## 2020-01-19 ENCOUNTER — Other Ambulatory Visit: Payer: Self-pay

## 2020-01-19 DIAGNOSIS — M109 Gout, unspecified: Secondary | ICD-10-CM | POA: Diagnosis not present

## 2020-01-19 DIAGNOSIS — Z882 Allergy status to sulfonamides status: Secondary | ICD-10-CM | POA: Insufficient documentation

## 2020-01-19 DIAGNOSIS — Z95810 Presence of automatic (implantable) cardiac defibrillator: Secondary | ICD-10-CM | POA: Insufficient documentation

## 2020-01-19 DIAGNOSIS — J449 Chronic obstructive pulmonary disease, unspecified: Secondary | ICD-10-CM | POA: Insufficient documentation

## 2020-01-19 DIAGNOSIS — Z8774 Personal history of (corrected) congenital malformations of heart and circulatory system: Secondary | ICD-10-CM | POA: Diagnosis not present

## 2020-01-19 DIAGNOSIS — M545 Low back pain: Secondary | ICD-10-CM | POA: Diagnosis not present

## 2020-01-19 DIAGNOSIS — Z951 Presence of aortocoronary bypass graft: Secondary | ICD-10-CM | POA: Insufficient documentation

## 2020-01-19 DIAGNOSIS — Z85850 Personal history of malignant neoplasm of thyroid: Secondary | ICD-10-CM | POA: Diagnosis not present

## 2020-01-19 DIAGNOSIS — I2510 Atherosclerotic heart disease of native coronary artery without angina pectoris: Secondary | ICD-10-CM | POA: Insufficient documentation

## 2020-01-19 DIAGNOSIS — I130 Hypertensive heart and chronic kidney disease with heart failure and stage 1 through stage 4 chronic kidney disease, or unspecified chronic kidney disease: Secondary | ICD-10-CM | POA: Diagnosis present

## 2020-01-19 DIAGNOSIS — E785 Hyperlipidemia, unspecified: Secondary | ICD-10-CM | POA: Diagnosis not present

## 2020-01-19 DIAGNOSIS — G4730 Sleep apnea, unspecified: Secondary | ICD-10-CM | POA: Diagnosis not present

## 2020-01-19 DIAGNOSIS — I252 Old myocardial infarction: Secondary | ICD-10-CM | POA: Insufficient documentation

## 2020-01-19 DIAGNOSIS — Z79899 Other long term (current) drug therapy: Secondary | ICD-10-CM | POA: Diagnosis not present

## 2020-01-19 DIAGNOSIS — N189 Chronic kidney disease, unspecified: Secondary | ICD-10-CM | POA: Diagnosis not present

## 2020-01-19 DIAGNOSIS — Z7902 Long term (current) use of antithrombotics/antiplatelets: Secondary | ICD-10-CM | POA: Diagnosis not present

## 2020-01-19 DIAGNOSIS — K219 Gastro-esophageal reflux disease without esophagitis: Secondary | ICD-10-CM | POA: Insufficient documentation

## 2020-01-19 DIAGNOSIS — I472 Ventricular tachycardia: Secondary | ICD-10-CM | POA: Insufficient documentation

## 2020-01-19 DIAGNOSIS — Z7982 Long term (current) use of aspirin: Secondary | ICD-10-CM | POA: Diagnosis not present

## 2020-01-19 DIAGNOSIS — I255 Ischemic cardiomyopathy: Secondary | ICD-10-CM | POA: Insufficient documentation

## 2020-01-19 DIAGNOSIS — I5022 Chronic systolic (congestive) heart failure: Secondary | ICD-10-CM

## 2020-01-19 DIAGNOSIS — I5042 Chronic combined systolic (congestive) and diastolic (congestive) heart failure: Secondary | ICD-10-CM

## 2020-01-19 HISTORY — PX: RIGHT HEART CATH: CATH118263

## 2020-01-19 LAB — POCT I-STAT EG7
Acid-Base Excess: 3 mmol/L — ABNORMAL HIGH (ref 0.0–2.0)
Acid-Base Excess: 3 mmol/L — ABNORMAL HIGH (ref 0.0–2.0)
Bicarbonate: 29.3 mmol/L — ABNORMAL HIGH (ref 20.0–28.0)
Bicarbonate: 29.7 mmol/L — ABNORMAL HIGH (ref 20.0–28.0)
Calcium, Ion: 1.07 mmol/L — ABNORMAL LOW (ref 1.15–1.40)
Calcium, Ion: 1.08 mmol/L — ABNORMAL LOW (ref 1.15–1.40)
HCT: 40 % (ref 39.0–52.0)
HCT: 41 % (ref 39.0–52.0)
Hemoglobin: 13.6 g/dL (ref 13.0–17.0)
Hemoglobin: 13.9 g/dL (ref 13.0–17.0)
O2 Saturation: 65 %
O2 Saturation: 65 %
Potassium: 3.6 mmol/L (ref 3.5–5.1)
Potassium: 3.6 mmol/L (ref 3.5–5.1)
Sodium: 141 mmol/L (ref 135–145)
Sodium: 142 mmol/L (ref 135–145)
TCO2: 31 mmol/L (ref 22–32)
TCO2: 31 mmol/L (ref 22–32)
pCO2, Ven: 50.5 mmHg (ref 44.0–60.0)
pCO2, Ven: 50.6 mmHg (ref 44.0–60.0)
pH, Ven: 7.372 (ref 7.250–7.430)
pH, Ven: 7.377 (ref 7.250–7.430)
pO2, Ven: 35 mmHg (ref 32.0–45.0)
pO2, Ven: 35 mmHg (ref 32.0–45.0)

## 2020-01-19 SURGERY — RIGHT HEART CATH
Anesthesia: LOCAL

## 2020-01-19 MED ORDER — SODIUM CHLORIDE 0.9 % IV SOLN: INTRAVENOUS | Status: DC

## 2020-01-19 MED ORDER — SODIUM CHLORIDE 0.9% FLUSH: 3.0000 mL | INTRAVENOUS | Status: DC | PRN

## 2020-01-19 MED ORDER — HYDRALAZINE HCL 20 MG/ML IJ SOLN: 10.0000 mg | INTRAMUSCULAR | Status: DC | PRN

## 2020-01-19 MED ORDER — ACETAMINOPHEN 325 MG PO TABS: 650.0000 mg | ORAL_TABLET | ORAL | Status: DC | PRN

## 2020-01-19 MED ORDER — LIDOCAINE HCL (PF) 1 % IJ SOLN
INTRAMUSCULAR | Status: AC
  Filled 2020-01-19: qty 30

## 2020-01-19 MED ORDER — SODIUM CHLORIDE 0.9 % IV SOLN: 250.0000 mL | INTRAVENOUS | Status: DC | PRN

## 2020-01-19 MED ORDER — ONDANSETRON HCL 4 MG/2ML IJ SOLN: 4.0000 mg | Freq: Four times a day (QID) | INTRAMUSCULAR | Status: DC | PRN

## 2020-01-19 MED ORDER — HEPARIN (PORCINE) IN NACL 1000-0.9 UT/500ML-% IV SOLN
INTRAVENOUS | Status: DC | PRN
  Administered 2020-01-19 (×2): 500 mL

## 2020-01-19 MED ORDER — ASPIRIN 81 MG PO CHEW: 81.0000 mg | CHEWABLE_TABLET | ORAL | Status: DC

## 2020-01-19 MED ORDER — FENTANYL CITRATE (PF) 100 MCG/2ML IJ SOLN
INTRAMUSCULAR | Status: DC | PRN
  Administered 2020-01-19: 12.5 ug via INTRAVENOUS

## 2020-01-19 MED ORDER — FENTANYL CITRATE (PF) 100 MCG/2ML IJ SOLN
INTRAMUSCULAR | Status: AC
  Filled 2020-01-19: qty 2

## 2020-01-19 MED ORDER — LIDOCAINE HCL (PF) 1 % IJ SOLN
INTRAMUSCULAR | Status: DC | PRN
  Administered 2020-01-19: 2 mL via INTRADERMAL

## 2020-01-19 MED ORDER — LABETALOL HCL 5 MG/ML IV SOLN: 10.0000 mg | INTRAVENOUS | Status: DC | PRN

## 2020-01-19 MED ORDER — SODIUM CHLORIDE 0.9% FLUSH: 3.0000 mL | Freq: Two times a day (BID) | INTRAVENOUS | Status: DC

## 2020-01-19 MED ORDER — MIDAZOLAM HCL 2 MG/2ML IJ SOLN
INTRAMUSCULAR | Status: AC
  Filled 2020-01-19: qty 2

## 2020-01-19 MED ORDER — MIDAZOLAM HCL 2 MG/2ML IJ SOLN
INTRAMUSCULAR | Status: DC | PRN
  Administered 2020-01-19: 0.5 mg via INTRAVENOUS

## 2020-01-19 MED ORDER — HEPARIN (PORCINE) IN NACL 1000-0.9 UT/500ML-% IV SOLN
INTRAVENOUS | Status: AC
  Filled 2020-01-19: qty 1000

## 2020-01-19 SURGICAL SUPPLY — 8 items
CATH BALLN WEDGE 5F 110CM (CATHETERS) ×2 IMPLANT
PACK CARDIAC CATHETERIZATION (CUSTOM PROCEDURE TRAY) ×2 IMPLANT
PROTECTION STATION PRESSURIZED (MISCELLANEOUS) ×2
SHEATH GLIDE SLENDER 4/5FR (SHEATH) ×2 IMPLANT
STATION PROTECTION PRESSURIZED (MISCELLANEOUS) ×1 IMPLANT
TRANSDUCER W/STOPCOCK (MISCELLANEOUS) ×2 IMPLANT
TUBING ART PRESS 72  MALE/FEM (TUBING) ×1
TUBING ART PRESS 72 MALE/FEM (TUBING) ×1 IMPLANT

## 2020-01-19 NOTE — Interval H&P Note (Signed)
History and Physical Interval Note:  01/19/2020 7:48 AM  Chad [name].  has presented today for surgery, with the diagnosis of chf.  The various methods of treatment have been discussed with the patient and family. After consideration of risks, benefits and other options for treatment, the patient has consented to  Procedure(s): RIGHT HEART CATH (N/A) as a surgical intervention.  The patient's history has been reviewed, patient examined, no change in status, stable for surgery.  I have reviewed the patient's chart and labs.  Questions were answered to the patient's satisfaction.     Kelly Eisler

## 2020-01-19 NOTE — Discharge Instructions (Signed)
Right Heart Catheterization  Follow these instructions at home: Eating and drinking   Follow instructions from your health care provider about eating or drinking restrictions. Activity  Rest as told by your health care provider.  Return to your normal activities as told by your health care provider. Ask your health care provider what activities are safe for you.  If you were given a sedative during your procedure, do not drive for 24 hours or until your health care provider approves. General instructions  Check your IV insertion area every day for signs of infection. Check for: ? Redness, swelling, or pain. ? Fluid or blood. ? Warmth. ? Pus or a bad smell.  Take over-the-counter and prescription medicines only as told by your health care provider.  Keep all follow-up visits as told by your health care provider. This is important. Contact a health care provider if:  Your skin becomes itchy or you develop a rash or hives.  You have a fever that does not get better with medicine.  You feel nauseous or you vomit.  You have redness, swelling, or pain around the insertion site.  You have fluid or blood coming from the insertion site.  Your insertion area feels warm to the touch.  You have pus or a bad smell coming from the insertion site. Get help right away if you:  Have shortness of breath or difficulty breathing.  Develop chest pain.  Faint.  Feel very dizzy. These symptoms may represent a serious problem that is an emergency. Do not wait to see if the symptoms will go away. Get medical help right away. Call your local emergency services (911 in the [place] Do not drive yourself to the hospital.  This information is not intended to replace advice given to you by your health care provider. Make sure you discuss any questions you have with your health care provider. Document Revised: 07/19/2019 Document Reviewed: 07/19/2019 Elsevier Patient Education  [place].

## 2020-01-20 DIAGNOSIS — M545 Low back pain: Secondary | ICD-10-CM | POA: Diagnosis not present

## 2020-01-20 DIAGNOSIS — M542 Cervicalgia: Secondary | ICD-10-CM | POA: Diagnosis not present

## 2020-01-20 DIAGNOSIS — M5442 Lumbago with sciatica, left side: Secondary | ICD-10-CM | POA: Diagnosis not present

## 2020-01-20 DIAGNOSIS — M5431 Sciatica, right side: Secondary | ICD-10-CM | POA: Diagnosis not present

## 2020-01-23 ENCOUNTER — Other Ambulatory Visit (HOSPITAL_COMMUNITY): Payer: Medicaid Other

## 2020-01-26 ENCOUNTER — Encounter (HOSPITAL_COMMUNITY): Payer: Medicaid Other

## 2020-01-31 IMAGING — US US ABDOMEN LIMITED
1 series · 14 of 25 positions shown · non-contrast
Comparison: None.

CLINICAL DATA: RIGHT upper quadrant pain.

EXAM:
ULTRASOUND ABDOMEN LIMITED RIGHT UPPER QUADRANT

[Series 1: us abdomen limited · 0.17mm/px · 14 of 32 slices shown]
[im 1/32]
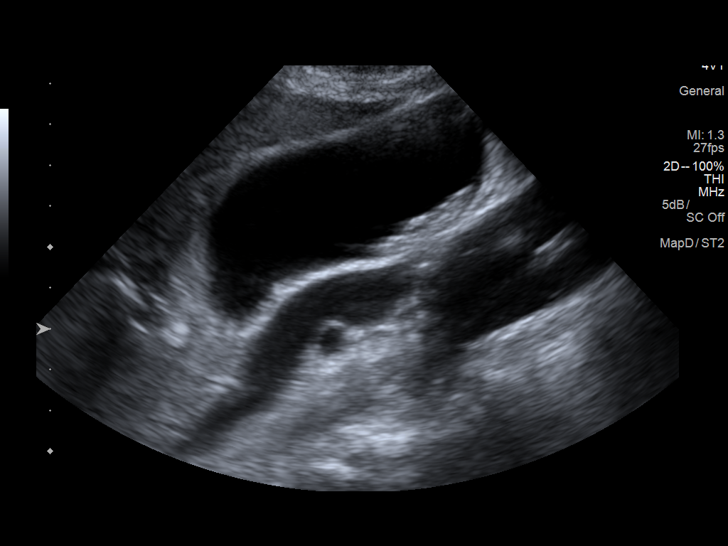
[im 3/32]
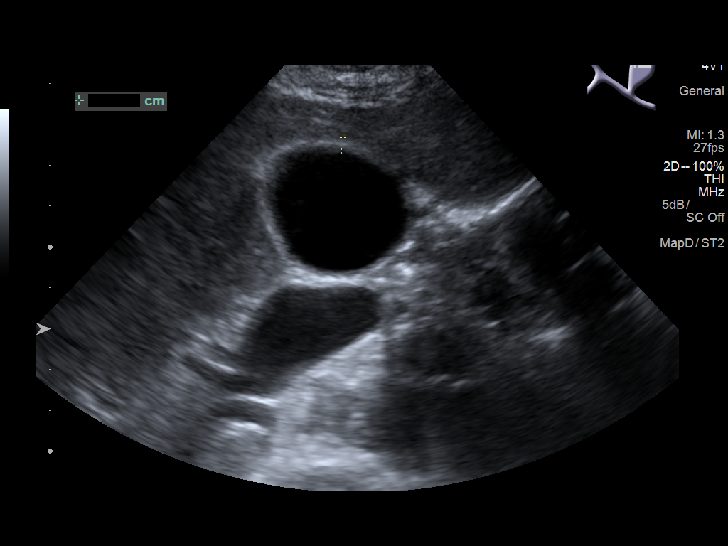
[im 6/32]
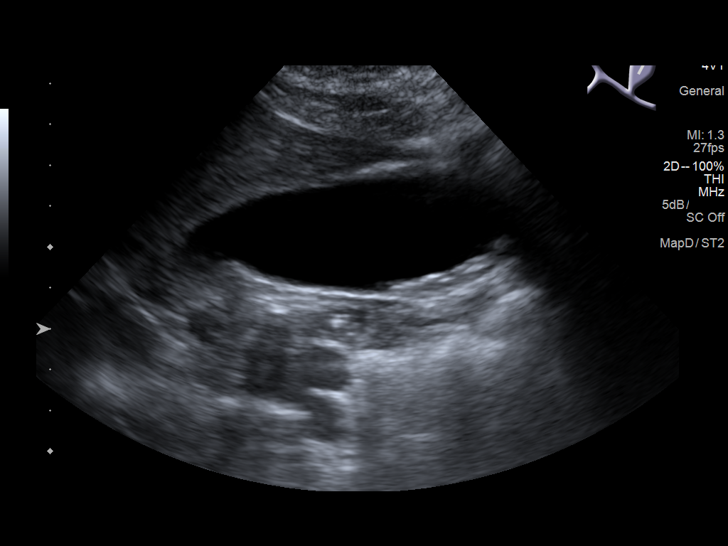
[im 8/32]
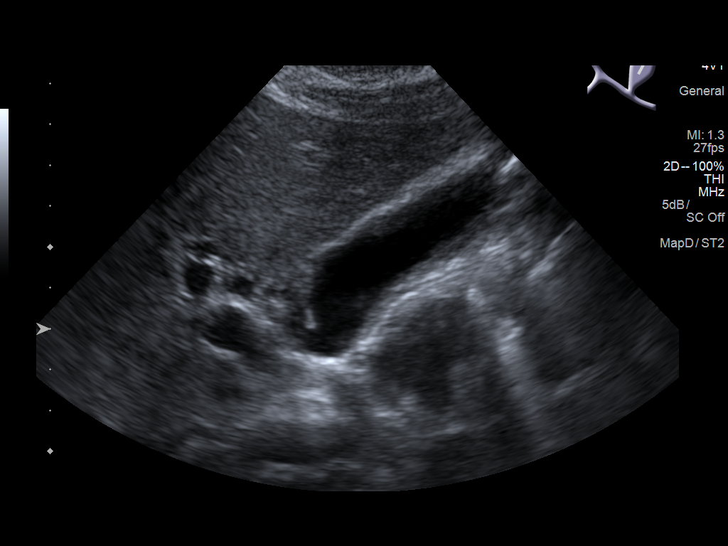
[im 11/32]
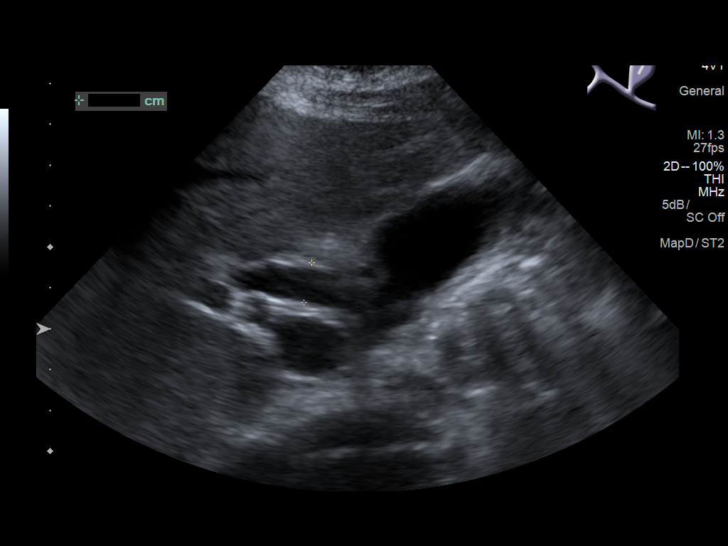
[im 12/32]
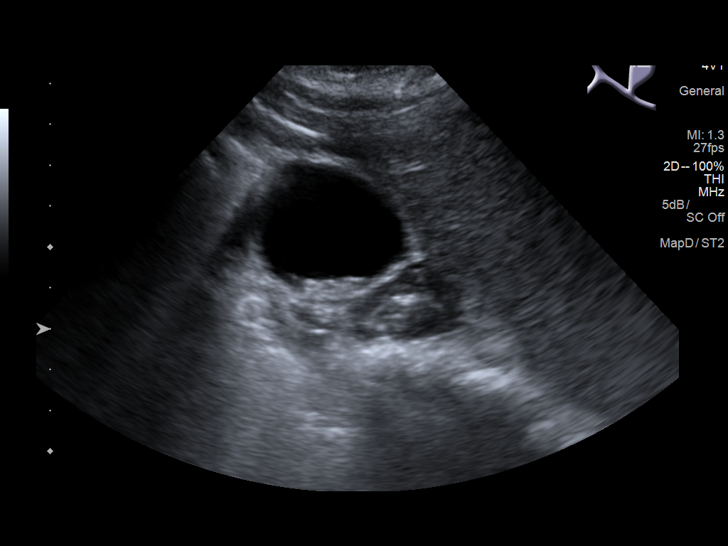
[im 15/32]
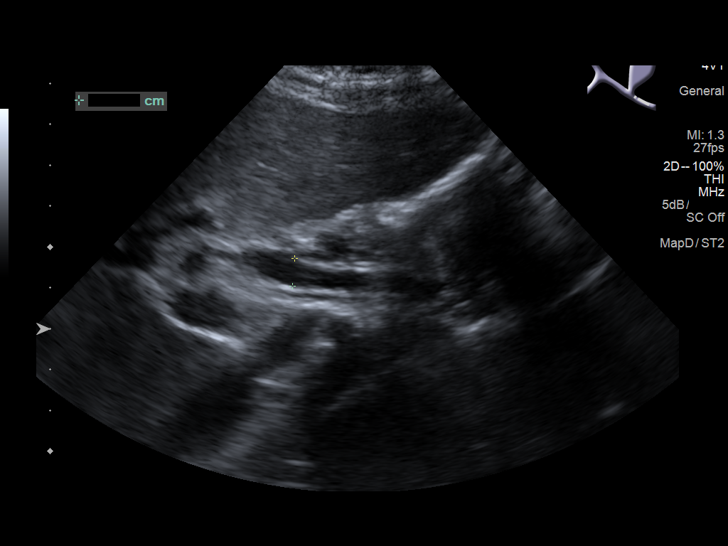
[im 17/32]
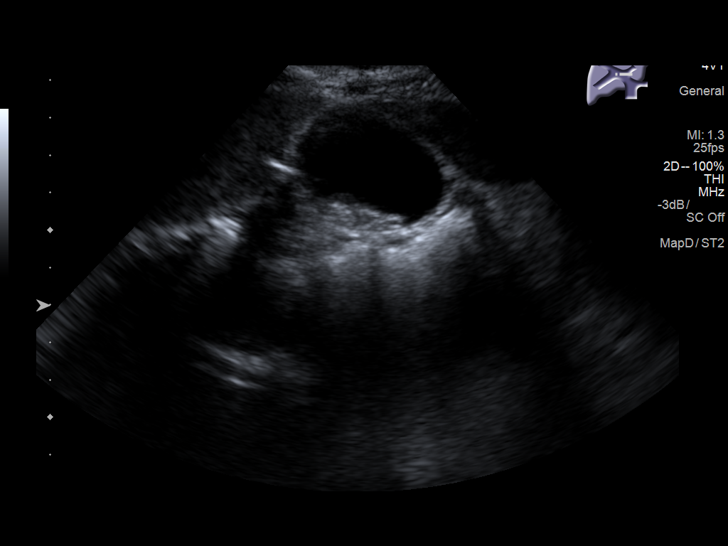
[im 20/32]
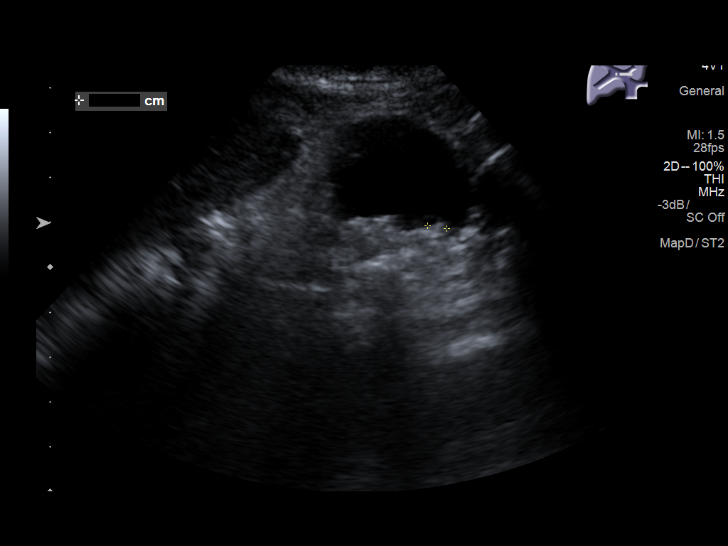
[im 21/32]
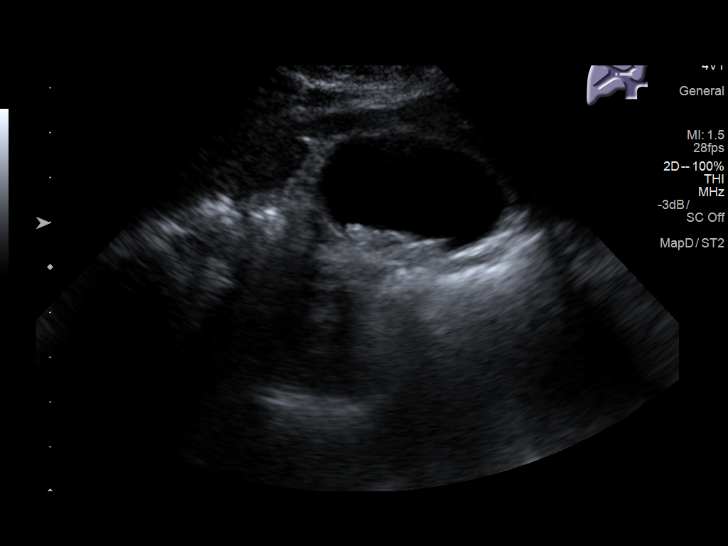
[im 24/32]
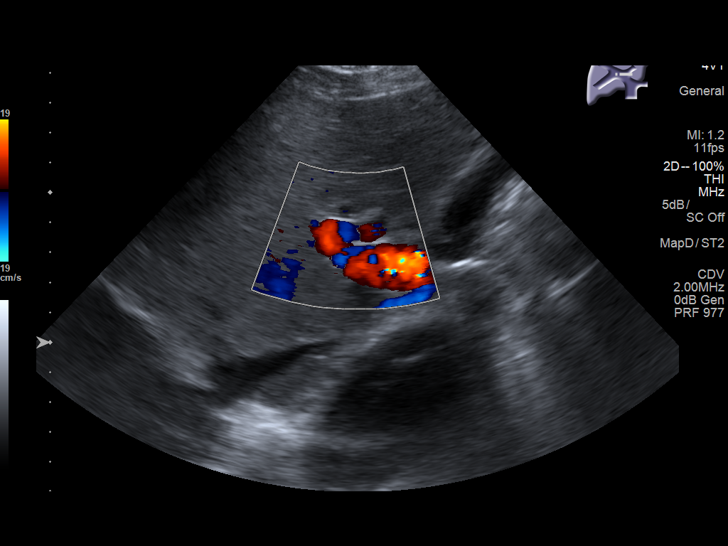
[im 26/32]
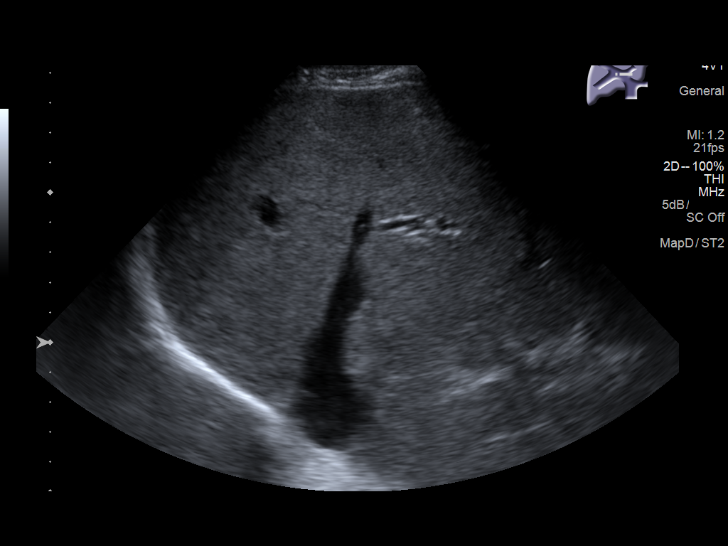
[im 29/32]
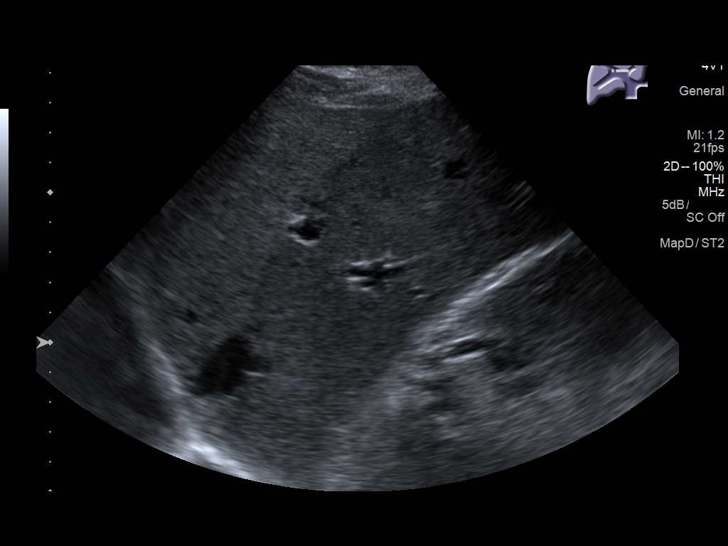
[im 32/32]
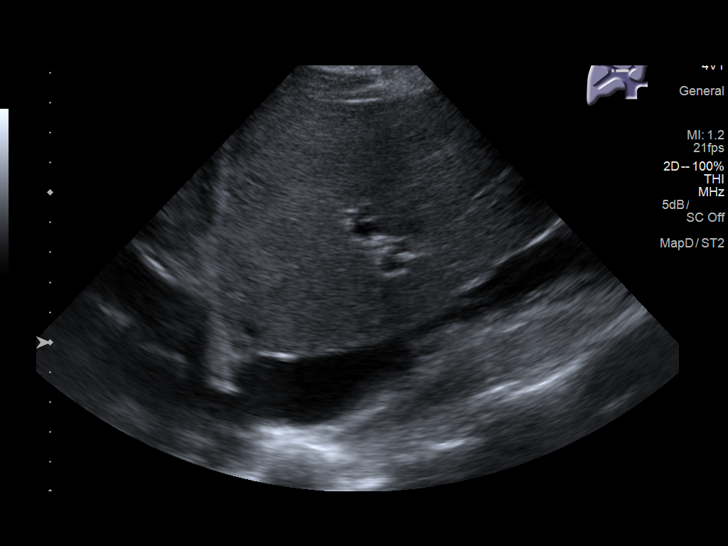

[14 of 25 positions shown; findings below may reference images not displayed]

FINDINGS: Gallbladder:

Multiple echogenic dependent gallstones measuring to 4 mm with
acoustic shadowing. Top-normal gallbladder wall thickness at 3 mm
without pericholecystic fluid. No sonographic Murphy sign elicited.

Common bile duct:

Diameter: 8-9 mm.

Liver:

No focal lesion identified. Within normal limits in parenchymal
echogenicity. Portal vein is patent on color Doppler imaging with
normal direction of blood flow towards the liver.
IMPRESSION: 1. Cholelithiasis without sonographic findings of acute
cholecystitis.
2. Dilated common bile duct, potentially from recently passed stone.

## 2020-02-08 IMAGING — US US EXTREM LOW*L* LIMITED
1 series · 7 of 7 positions shown · non-contrast
Comparison: None.

CLINICAL DATA: Initial evaluation for mass on left hip.

EXAM:
ULTRASOUND LEFT LOWER EXTREMITY LIMITED
TECHNIQUE: Ultrasound examination of the lower extremity soft tissues was
performed in the area of clinical concern.

[Series 1: us extrem low*left* limited · 0.11mm/px · 7 of 7 slices shown]
[im 1/7]
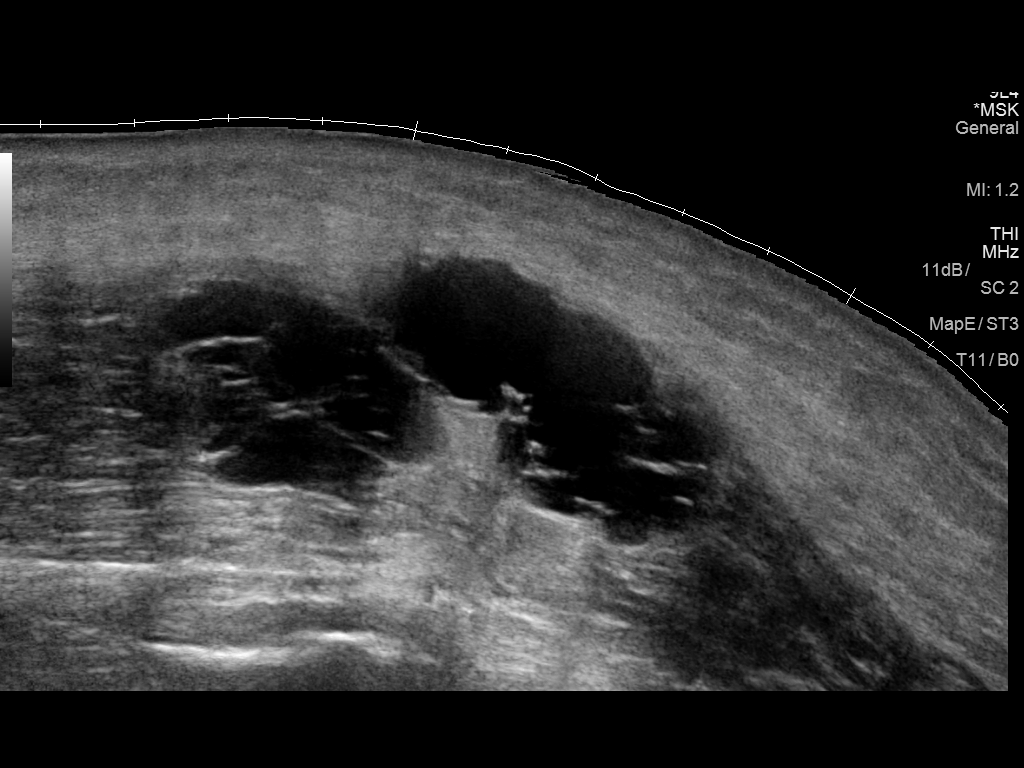
[im 2/7]
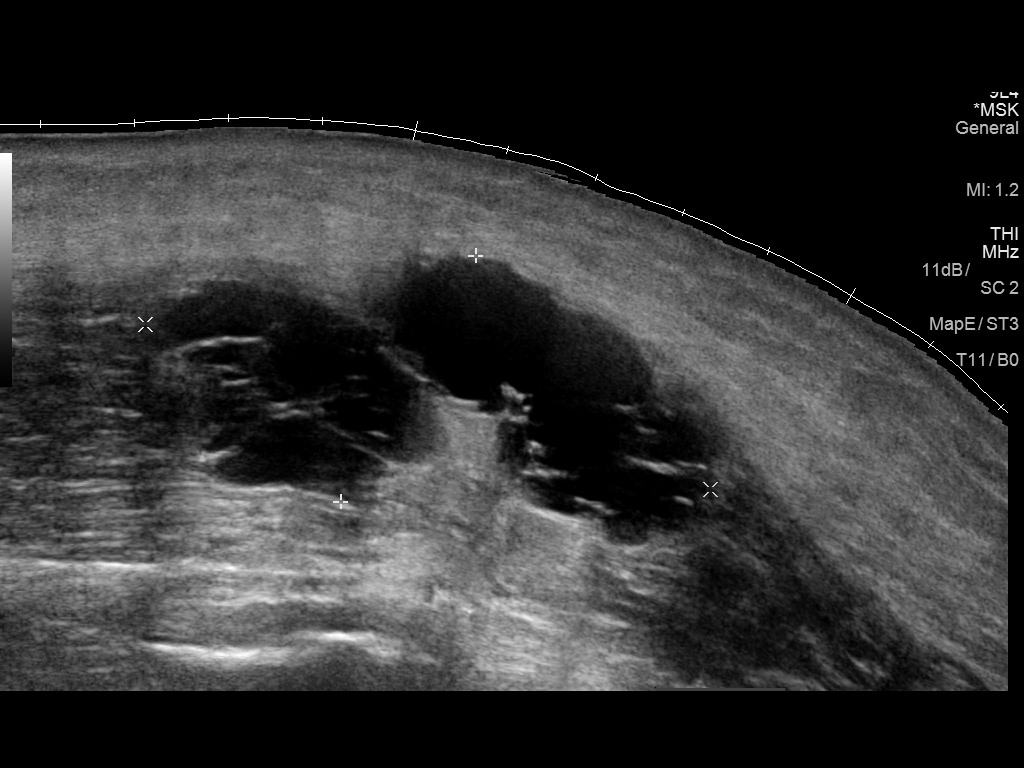
[im 3/7]
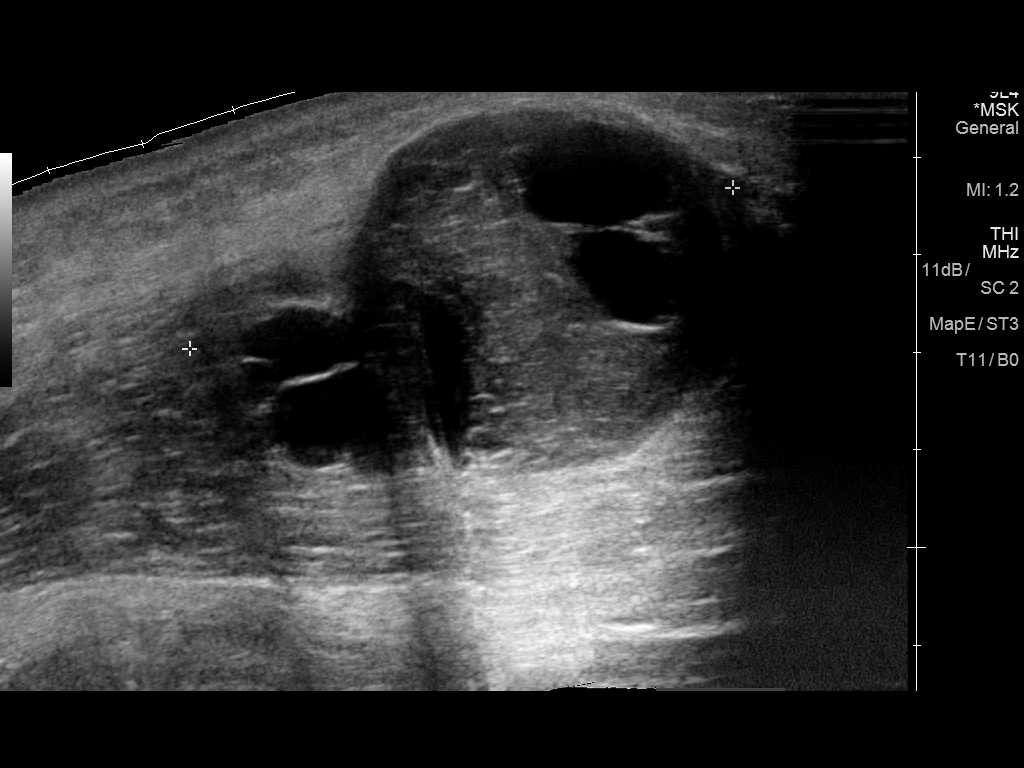
[im 4/7]
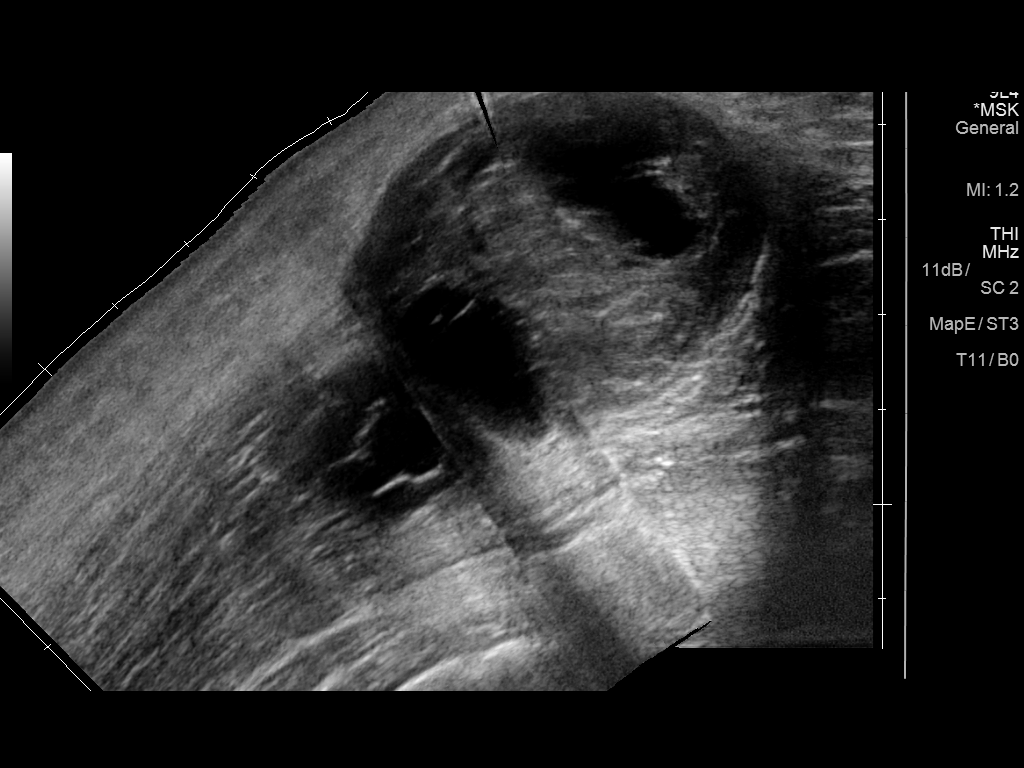
[im 5/7]
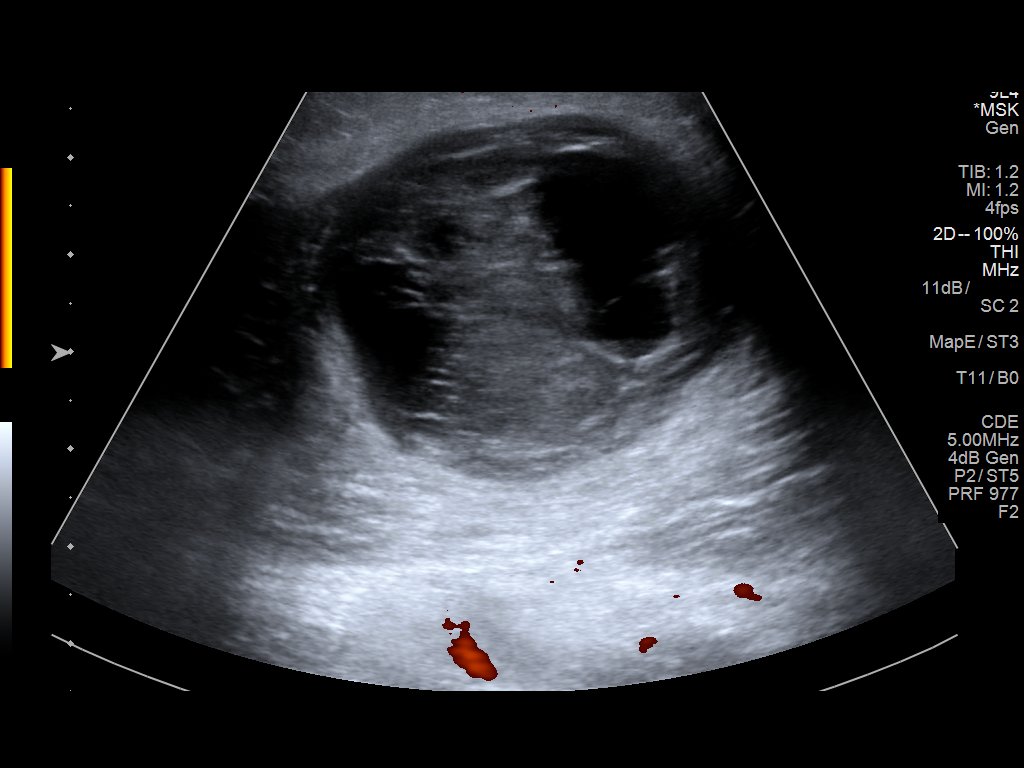
[im 6/7]
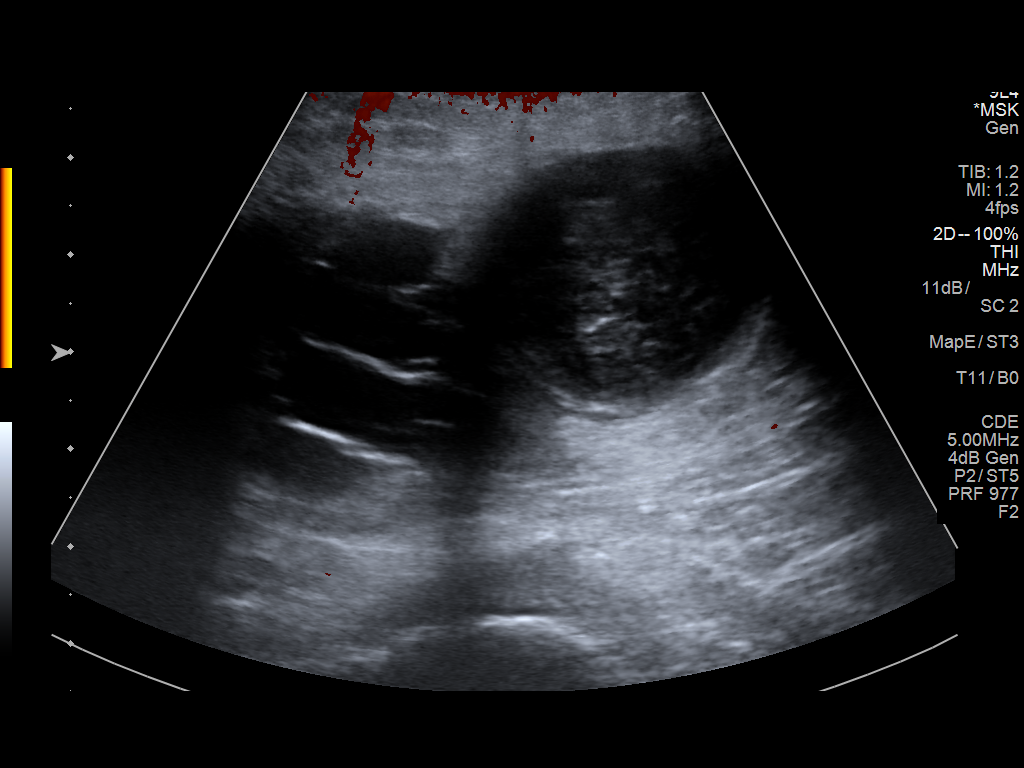
[im 7/7]
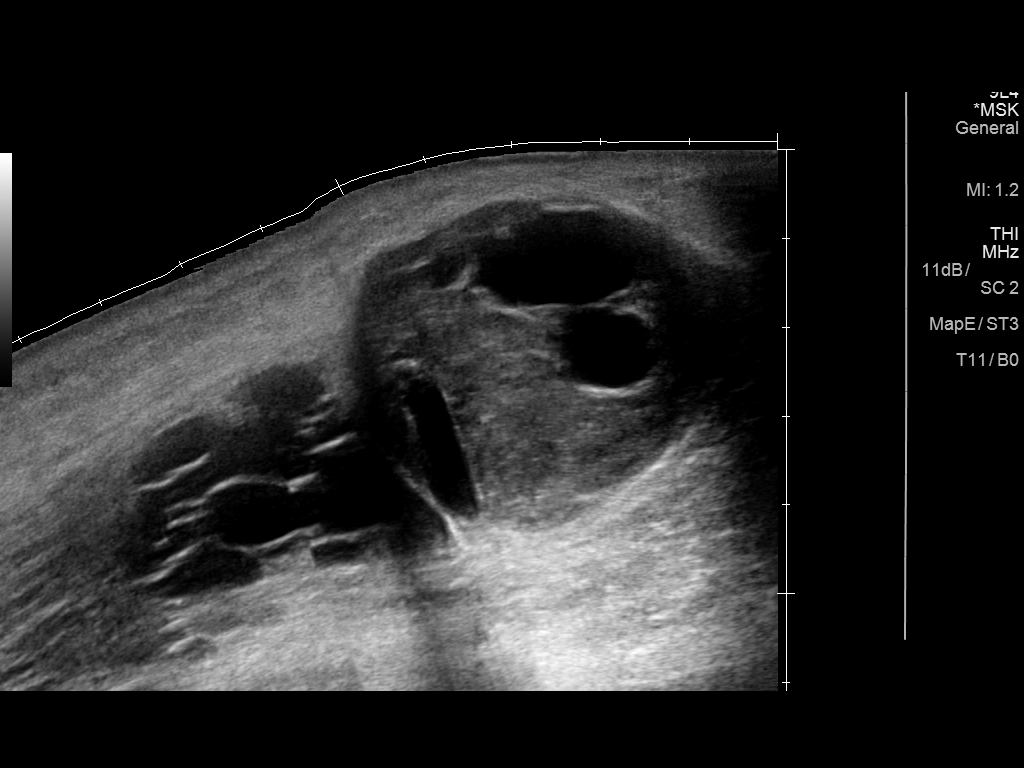

[7 of 7 positions shown; findings below may reference images not displayed]

FINDINGS: Targeted ultrasound of a palpable mass at the left hip was
performed. Ultrasound demonstrates a fairly well-circumscribed
complex mass that measures approximately 3.0 x 6.3 x 5.8 cm. Lesion
demonstrates heterogeneous echotexture with internal areas of
anechoic/fluid like density and more solid-appearing components. No
appreciable internal vascularity by ultrasound. Scattered internal
septations. Lesion involves the subcutaneous fat as well as the
underlying musculature.
IMPRESSION: 3.0 x 6.3 x 5.8 cm complex mass positioned at the palpable area of
concern at the left hip. Primary differential considerations include
the sequelae of trauma with resultant soft tissue hematoma or
possibly infection with abscess formation. A neoplastic process with
soft tissue mass could also have this appearance. Further assessment
with dedicated MRI, with and without contrast, suggested for further
characterization.

## 2020-02-09 ENCOUNTER — Ambulatory Visit (INDEPENDENT_AMBULATORY_CARE_PROVIDER_SITE_OTHER): Payer: Medicaid Other | Admitting: *Deleted

## 2020-02-09 ENCOUNTER — Encounter (HOSPITAL_COMMUNITY): Payer: Self-pay | Admitting: Internal Medicine

## 2020-02-09 ENCOUNTER — Other Ambulatory Visit: Payer: Self-pay

## 2020-02-09 ENCOUNTER — Ambulatory Visit (HOSPITAL_COMMUNITY)
Admission: RE | Admit: 2020-02-09 | Discharge: 2020-02-09 | Disposition: A | Discharge status: Home / Self Care | Payer: Medicaid Other | Source: Ambulatory Visit | Attending: Internal Medicine | Admitting: Internal Medicine

## 2020-02-09 VITALS — BP 116/70 | HR 73 | Wt 165.8 lb

## 2020-02-09 DIAGNOSIS — R0602 Shortness of breath: Secondary | ICD-10-CM | POA: Diagnosis present

## 2020-02-09 DIAGNOSIS — M109 Gout, unspecified: Secondary | ICD-10-CM | POA: Diagnosis not present

## 2020-02-09 DIAGNOSIS — N182 Chronic kidney disease, stage 2 (mild): Secondary | ICD-10-CM | POA: Diagnosis not present

## 2020-02-09 DIAGNOSIS — R55 Syncope and collapse: Secondary | ICD-10-CM | POA: Diagnosis not present

## 2020-02-09 DIAGNOSIS — Z7982 Long term (current) use of aspirin: Secondary | ICD-10-CM | POA: Diagnosis not present

## 2020-02-09 DIAGNOSIS — J449 Chronic obstructive pulmonary disease, unspecified: Secondary | ICD-10-CM | POA: Diagnosis not present

## 2020-02-09 DIAGNOSIS — I255 Ischemic cardiomyopathy: Secondary | ICD-10-CM | POA: Insufficient documentation

## 2020-02-09 DIAGNOSIS — I340 Nonrheumatic mitral (valve) insufficiency: Secondary | ICD-10-CM | POA: Diagnosis not present

## 2020-02-09 DIAGNOSIS — I5022 Chronic systolic (congestive) heart failure: Secondary | ICD-10-CM | POA: Insufficient documentation

## 2020-02-09 DIAGNOSIS — N189 Chronic kidney disease, unspecified: Secondary | ICD-10-CM | POA: Diagnosis not present

## 2020-02-09 DIAGNOSIS — M545 Low back pain: Secondary | ICD-10-CM | POA: Insufficient documentation

## 2020-02-09 DIAGNOSIS — I10 Essential (primary) hypertension: Secondary | ICD-10-CM

## 2020-02-09 DIAGNOSIS — I252 Old myocardial infarction: Secondary | ICD-10-CM | POA: Insufficient documentation

## 2020-02-09 DIAGNOSIS — I130 Hypertensive heart and chronic kidney disease with heart failure and stage 1 through stage 4 chronic kidney disease, or unspecified chronic kidney disease: Secondary | ICD-10-CM | POA: Diagnosis not present

## 2020-02-09 DIAGNOSIS — I2510 Atherosclerotic heart disease of native coronary artery without angina pectoris: Secondary | ICD-10-CM | POA: Diagnosis not present

## 2020-02-09 DIAGNOSIS — Z7902 Long term (current) use of antithrombotics/antiplatelets: Secondary | ICD-10-CM | POA: Diagnosis not present

## 2020-02-09 DIAGNOSIS — I472 Ventricular tachycardia: Secondary | ICD-10-CM | POA: Diagnosis not present

## 2020-02-09 DIAGNOSIS — E785 Hyperlipidemia, unspecified: Secondary | ICD-10-CM | POA: Insufficient documentation

## 2020-02-09 DIAGNOSIS — M1990 Unspecified osteoarthritis, unspecified site: Secondary | ICD-10-CM | POA: Insufficient documentation

## 2020-02-09 DIAGNOSIS — Z95810 Presence of automatic (implantable) cardiac defibrillator: Secondary | ICD-10-CM | POA: Diagnosis not present

## 2020-02-09 DIAGNOSIS — Z8774 Personal history of (corrected) congenital malformations of heart and circulatory system: Secondary | ICD-10-CM | POA: Insufficient documentation

## 2020-02-09 DIAGNOSIS — Z79899 Other long term (current) drug therapy: Secondary | ICD-10-CM | POA: Insufficient documentation

## 2020-02-09 DIAGNOSIS — Z951 Presence of aortocoronary bypass graft: Secondary | ICD-10-CM | POA: Diagnosis not present

## 2020-02-09 LAB — CUP PACEART REMOTE DEVICE CHECK
Battery Remaining Longevity: 72 mo
Battery Remaining Percentage: 76 %
Brady Statistic RV Percent Paced: 0 %
Date Time Interrogation Session: 20210215032100
HighPow Impedance: 51 Ohm
Implantable Lead Implant Date: 20171114
Implantable Lead Location: 753860
Implantable Lead Model: 181
Implantable Lead Serial Number: 333496
Implantable Pulse Generator Implant Date: 20131220
Lead Channel Impedance Value: 453 Ohm
Lead Channel Pacing Threshold Amplitude: 0.8 V
Lead Channel Pacing Threshold Pulse Width: 0.4 ms
Lead Channel Setting Pacing Amplitude: 3.5 V
Lead Channel Setting Pacing Pulse Width: 0.4 ms
Lead Channel Setting Sensing Sensitivity: 0.5 mV
Pulse Gen Serial Number: 124654

## 2020-02-09 LAB — CBC
HCT: 41.4 % (ref 39.0–52.0)
Hemoglobin: 13 g/dL (ref 13.0–17.0)
MCH: 27.3 pg (ref 26.0–34.0)
MCHC: 31.4 g/dL (ref 30.0–36.0)
MCV: 86.8 fL (ref 80.0–100.0)
Platelets: 106 10*3/uL — ABNORMAL LOW (ref 150–400)
RBC: 4.77 MIL/uL (ref 4.22–5.81)
RDW: 14 % (ref 11.5–15.5)
WBC: 3.6 10*3/uL — ABNORMAL LOW (ref 4.0–10.5)
nRBC: 0 % (ref 0.0–0.2)

## 2020-02-09 LAB — COMPREHENSIVE METABOLIC PANEL
ALT: 16 U/L (ref 0–44)
AST: 16 U/L (ref 15–41)
Albumin: 3.9 g/dL (ref 3.5–5.0)
Alkaline Phosphatase: 71 U/L (ref 38–126)
Anion gap: 12 (ref 5–15)
BUN: 27 mg/dL — ABNORMAL HIGH (ref 6–20)
CO2: 28 mmol/L (ref 22–32)
Calcium: 8.6 mg/dL — ABNORMAL LOW (ref 8.9–10.3)
Chloride: 100 mmol/L (ref 98–111)
Creatinine, Ser: 1.98 mg/dL — ABNORMAL HIGH (ref 0.61–1.24)
GFR calc Af Amer: 42 mL/min — ABNORMAL LOW (ref >60)
GFR calc non Af Amer: 36 mL/min — ABNORMAL LOW (ref >60)
Glucose, Bld: 106 mg/dL — ABNORMAL HIGH (ref 70–99)
Potassium: 3.8 mmol/L (ref 3.5–5.1)
Sodium: 140 mmol/L (ref 135–145)
Total Bilirubin: 1.1 mg/dL (ref 0.3–1.2)
Total Protein: 6.6 g/dL (ref 6.5–8.1)

## 2020-02-09 LAB — TSH: TSH: 3.265 u[IU]/mL (ref 0.350–4.500)

## 2020-02-09 LAB — BRAIN NATRIURETIC PEPTIDE: B Natriuretic Peptide: >4500 pg/mL — ABNORMAL HIGH (ref 0.0–100.0)

## 2020-02-09 LAB — T4, FREE: Free T4: 0.91 ng/dL (ref 0.61–1.12)

## 2020-02-09 MED ORDER — POTASSIUM CHLORIDE CRYS ER 20 MEQ PO TBCR: 20.0000 meq | EXTENDED_RELEASE_TABLET | Freq: Two times a day (BID) | ORAL | 3 refills | Status: DC

## 2020-02-09 MED ORDER — METOLAZONE 2.5 MG PO TABS: 2.5000 mg | ORAL_TABLET | ORAL | 3 refills | Status: DC

## 2020-02-09 NOTE — Progress Notes (Signed)
ReDS Vest / Clip - 02/09/20 0900      ReDS Vest / Clip   Station Marker  C    Ruler Value  27    ReDS Value Range  (!) High volume overload    ReDS Actual Value  48    Anatomical Comments  sitting

## 2020-02-09 NOTE — Progress Notes (Addendum)
Advanced Heart Failure Clinic Note   Patient ID: Chad Bark., male   DOB: 04-02-1961, 59 y.o.   MRN: [id number]   Primary Cardiologist:  Dr. Glori Bickers  Reason for Visit: 1 Week Freedom Trial   History of Present Illness: Chad [name] is a 59 y.o. male with history of severe HTN, coronary artery disease status post previous myocardial infarction and bypass surgery in 2006 also DES to native PDA in 2011.  He also has a history congestive heart failure secondary to ischemic cardiomyopathy EF 20-25%   He is s/p single chamber [employer] ICD.  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 Also with significant low back pain s/p 2 surgeries.   He has had episodes of CP but have avoided cath due to previous dissection and solitary kidney.  Myoview 11/2015  1. Evidence for very mild reversibility and ischemia involving the anterolateral wall and a portion of the lateral wall. 2. Large infarct involving the inferior wall. There is dyskinesia in the inferior wall. 3. Left ventricular ejection fraction is 32%. Left ventricle dilatation.  In 11/17 admitted for ICD shocks due to fractured RV lead. Underwent lead extraction and replacement.   We saw him in 2/18 for acute visit due to increasing dyspnea and volume overload. ECHO EF 20-25% moderate RV dysfunction. Moderate AI and severe MR. Ao Root 21mm-> 60mm. In 4/18, he saw Dr. 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 He was referred to [facility] team for further evaluation in 6/18 and felt to  be too early for transplant based on CPX.  Admitted 5/6-05/01/18 with chest pain. Troponins negative and EKG was unchanged. Chest pain thought to be related to HTN and possibly anxiety. He declined celexa. He was instructed to take extra 50 mg hydralazine for SBP >140 and 1 mg cardura for SBP >160.  Repeat CPX in 1/21  Peak VO2: 18.7 -> 15.8  (48% predicted peak VO2) VE/VCO2 slope: stable at34  Echo 12/31/19: EF 15-20% RV moderately down. Severe MR/moderate TR  RHC 01/19/20 RA = 1 RV = 32/1 PA = 32/11 (21) PCW = 9 Fick cardiac output/index = 4.0/2.1 PVR = 3.0 WU Ao sat = 98% PA sat = 65%, 65%  Here for routine f/u. Feels like he is continuing to slow down. SOB with mild activities. Feels like he is volume overloaded. No edema, orthopnea or PND. Weight up 3 pounds. Taking torsemide 60 bid.   Cardiac studies:  CPX 01/05/20 FVC 2.70 (65%)    FEV1 2.09 (64%)     FEV1/FVC 77 (98%)     MVV 105 (71%)     Resting HR: 73 Standing HR: 71 Peak HR: 112  (69% age predicted max HR)  BP rest: 104/68 Standing BP: 92/64 BP peak: 108/60  Peak VO2: 15.8 (48% predicted peak VO2)  VE/VCO2 slope: 34  OUES: 1.40  Peak RER: 0.96  Ventilatory Threshold: 12.9 (39% predicted or measured peak VO2)  VE/MVV: 33%  O2pulse: 9  (60% predicted O2pulse)    CPX 05/15/18 FVC 2.86 (67%)    FEV1 2.26 (68%)  FEV1/FVC 79 (100%)     MVV 106 (70%)  Resting HR: 71 Peak HR: 110  (67% age predicted max HR) BP rest: 100/64 BP peak: 118/66 Peak VO2: 18.7 (56% predicted peak VO2) VE/VCO2 slope: 34 OUES: 1.45 Peak RER: 1.04 Ventilatory Threshold: 11.8 (35% predicted and 63% measured peak VO2) VE/MVV: 43% O2pulse: 12  (80% predicted O2pulse)  CPX 4/18  FVC 3.11 (73%)    FEV1 2.40 (71%)     FEV1/FVC 77 (97%)     MVV 117 (77%) Resting HR: 75 Peak HR: 134  (81% age predicted max HR) BP rest: 120/68 BP peak: 146/82 Peak VO2: 18.6 (55% predicted peak VO2) VE/VCO2 slope:  31 OUES: 2.01 Peak RER: 1.05 Ventilatory Threshold: 16.0 (47% predicted or measured peak VO2) VE/MVV: 49% O2pulse: 13  (76% predicted O2pulse)  04/18/2018 20-25%.  3/18: ECHO EF 20-25% Moderate AI, moderate to severe MR, moderate TR. Ao Root 50cm 10/22/2015: ECHO EF 20-25% 11/26/12 ECHO EF 35-40%  Review of systems complete and found to be negative unless listed in HPI.   Past Medical History:  Diagnosis Date  . AICD (automatic cardioverter/defibrillator) present   . Anemia   . Anginal pain ([place])   . Anxiety   . Aortic dissection, thoracoabdominal ([place])    7/10: Type I s/p repair  . Arthritis   . Asthma   . CAD (coronary artery disease)    a. s/p CABG 2006;  b. DES to PDA 2011 (cath: Dx not seen, dRCA/PDA tx with DES; S-PDA occluded (culprit), S-Dx occluded, S-RI and OM ok, L-LAD ok  . Carotid stenosis    dopplers 2011: 0-39% bilat.  . Chest pain syndrome   . CHF (congestive heart failure) ([place])   . Chronic bronchitis ([place])   . Chronic lower back pain   . Chronic systolic heart failure (HCC)    a. 12/13 ECHO: EF 35-40%, sept, apical & posterobasal HK, LV mod dil & sys fx mod reduced, mild AI, MV mild reg, TV mild reg  . Complication of anesthesia    "difficult to wake afterwards a couple of times" and patient states he has woken during surgery   . 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 Dyspnea    patient denies at 12/06/2018 appt   . Family history of adverse reaction to anesthesia    "sister hard to wake up"  . GERD (gastroesophageal reflux disease)   . Gout   . Heart murmur   . HLD (hyperlipidemia)   . HTN (hypertension)    severe  . Myocardial infarction [facility])    "many" (11/07/2016)  . PONV (postoperative nausea and vomiting)   . Sleep apnea    does not use mask   . Thyroid cancer ([place])    Hertle Cell    Current Outpatient Medications  Medication Sig Dispense Refill  . albuterol (PROVENTIL  HFA;VENTOLIN HFA) 108 (90 Base) MCG/ACT inhaler Inhale 2 puffs into the lungs every 6 (six) hours as needed for wheezing or shortness of breath. 1 Inhaler 0  . Alirocumab (PRALUENT) 75 MG/ML SOAJ Inject 1 pen into the skin every 14 (fourteen) days. 2 pen 11  . allopurinol (ZYLOPRIM) 100 MG tablet Take 50 mg by mouth daily.    [name] aspirin EC 81 MG tablet Take 81 mg by mouth every evening.    . carvedilol (COREG) 25 MG tablet Take 12.5 mg by mouth 2 (two) times daily with a meal.    .  clopidogrel (PLAVIX) 75 MG tablet Take 75 mg by mouth every evening.     . cyanocobalamin (,VITAMIN B-12,) 1000 MCG/ML injection Inject 1,000 mcg into the muscle every 14 (fourteen) days.    . cyclobenzaprine (FLEXERIL) 10 MG tablet Take 10 mg by mouth 3 (three) times daily as needed for muscle spasms.    [name] doxazosin (CARDURA) 1 MG tablet Take 1 tablet (1 mg total) by mouth every 12 (twelve) hours as needed (for systolic BP > 0000000 mm Hg.). 90 tablet 3  . fluticasone (FLONASE) 50 MCG/ACT nasal spray Place 2 sprays into both nostrils daily as needed for allergies or rhinitis.     . hydrALAZINE (APRESOLINE) 50 MG tablet Take 75 mg by mouth 3 (three) times daily.     [name] ipratropium-albuterol (DUONEB) 0.5-2.5 (3) MG/3ML SOLN Take 3 mLs by nebulization every 6 (six) hours as needed (wheezing, sob).    [name] MITIGARE 0.6 MG CAPS Take 2 capsules by mouth two times daily    . nitroGLYCERIN (NITROSTAT) 0.4 MG SL tablet Place 0.4 mg under the tongue every 5 (five) minutes as needed for chest pain.    . Oxycodone HCl 20 MG TABS Take 20 mg by mouth every 6 (six) hours as needed.     . potassium chloride SA (KLOR-CON) 20 MEQ tablet Take 20 mEq by mouth 2 (two) times daily.    . sacubitril-valsartan (ENTRESTO) 97-103 MG Take 1 tablet by mouth 2 (two) times daily. 180 tablet 3  . sildenafil (VIAGRA) 100 MG tablet Take 50 mg by mouth daily as needed for erectile dysfunction.    . torsemide (DEMADEX) 20 MG tablet Take 3 tablets (60 mg total) by  mouth 2 (two) times daily. 180 tablet 3   No current facility-administered medications for this encounter.   Facility-Administered Medications Ordered in Other Encounters  Medication Dose Route Frequency Provider Last Rate Last Admin  . sodium chloride flush (NS) 0.9 % injection 3 mL  3 mL Intravenous Q12H Skylin Kennerson, Shaune Pascal, MD        Allergies  Allergen Reactions  . Contrast Media [Iodinated Diagnostic Agents] Anaphylaxis  . Iohexol Anaphylaxis and Other (See Comments)    PT HAS ANAPHYLAXIS WITH CONTRAST MEDIA!  . Lipitor [Atorvastatin Calcium] Anaphylaxis and Other (See Comments)    Large doses  . Shellfish Allergy Anaphylaxis  . Sulfa Antibiotics Shortness Of Breath and Swelling  . Sulfonamide Derivatives Shortness Of Breath and Swelling  . Almond Oil Itching    FACIAL/MOUTH ITCHING  . Metrizamide Swelling    SWELLING REACTION UNSPECIFIED   . Zocor [Simvastatin] Other (See Comments)    Muscle cramps  . Food Itching    Shellfish, peaches, almonds, apples & kiwis  . Latex Rash and Other (See Comments)    With long periods of exposure  . Peach [Prunus Persica] Itching    Vital Signs: Vitals:   02/09/20 0938  BP: 116/70  Pulse: 73  SpO2: 98%  Weight: 75.2 kg (165 lb 12.8 oz)   Filed Weights   02/09/20 0938  Weight: 75.2 kg (165 lb 12.8 oz)    Wt Readings from Last 3 Encounters:  02/09/20 75.2 kg (165 lb 12.8 oz)  01/19/20 72.6 kg (160 lb)  01/15/20 75.1 kg (165 lb 9.6 oz)   PHYSICAL EXAM: General:  Thin fatigued appearing. No respiratory difficulty General:  Well appearing. No resp difficulty HEENT: normal Neck: supple. JVP 6-7  Carotids 2+ bilat; no bruits. No lymphadenopathy or thryomegaly appreciated. Cor: PMI nondisplaced.  Regular rate & rhythm. 3/6 MR Lungs: clear Abdomen: soft, nontender, nondistended. No hepatosplenomegaly. No bruits or masses. Good bowel sounds. Extremities: no cyanosis, clubbing, rash, edema Neuro: alert & orientedx3, cranial nerves  grossly intact. moves all 4 extremities w/o difficulty. Affect pleasant    ASSESSMENT/PLAN:  1. Chronic systolic HF: Ischemic cardiomyopathy.  Echo (2/16) with EF 25%. S/p [employer] ICD. Echo 3/18. EF 20-25%. Echo 04/18/18: EF 20-25%, grade 2 DD\ - ECHO  12/31/19 showed EF 15- 20% RV moderately down, severe MR - CPX 01/05/20 with pVO2: 15.8 (48% predicted peak VO2), slope 34 RER 0.96 BP rest: 104/68 Standing BP: 92/64 BP peak: 108/60  VE/VCO2 slope: 34Resting HR: 73 Standing HR: 71 Peak HR: 112  (69% age predicted max HR)  - RHC 1/21 reassuring hemodynamics - He has seen w Dr. Prescott Gum in the past and given previous dissection and aortic root replacement likely not a candidate for VAD here. - He has deteriorated now NYHA persistently IIIb. CPX worse - Volume status up today. REDS 48%. Will add metolazone 2.5 and kcl 40 every Monday.  - Continue Entresto 97/103 bid - Continue carvedilol 12.5 mg twice a day. (recently cut back due to progressive HF symptoms)   - Continue hydralazine 75 mg bid - Continue spironolactone 12.5 mg daily. - No imdur  with Viagra use  - Discussed with [facility] who will see him back in near future. If not candidate for heart-kidney transplant or VAD could consider MitraClip as I think MR is significantly contributing to symptoms - though he is more like [place] patient then [facility]  2. CAD s/p CABG 2006:  - Cardiolite 11/2015 with evidence for mild reversibility involving the anterolateral wall and a portion of the lateral wall. - We have avoided left heart cath due to dissection and CKD with solitary kidney (lost kidney due to dissection) - Continue Plavix and ASA 81 mg daily.   - Statin intolerance at any dose. Also unable to tolerate Zetia - has been referred to Lipid Clinic for possible PCSK-9   3. CKD in setting of solitary kidney s/p dissection - Check labs today  4. HTN:  - Blood pressure well controlled. Continue current  regimen.  5. H/o Type I aortic dissection: Follows with CVTS.  - He has seen w Dr. Prescott Gum. Given previous dissection and aortic root replacement likely not candidate for VAD. - Echo 04/18/18: aortic root, ascending aorta, and aortic arch all mildly dilated - repeat ECHO 12/2019: Ao Root diam: 5.10 cm, Ao Asc diam: 4.30 cm  6. NSVT - in the setting of severe CM - continue Coreg - checking BMP and Mg labs today for Freedom Trial   7. MR  - Severe on ECHO - See MitraClip discussion above  ICD interrogated personally in Clinic. No VT.    Glori Bickers, MD 10:03 AM

## 2020-02-09 NOTE — Patient Instructions (Signed)
Take Metolazone 2.5 mg every Monday  Take an extra 40 meq (2 tabs) of Potassium when you take Metolazone  Labs done today, will notify you of abnormal results  Your physician recommends that you schedule a follow-up appointment in: 6 weeks  At the [facility], you and your health needs are our priority. As part of our continuing mission to provide you with exceptional heart care, we have created designated Provider Care Teams. These Care Teams include your primary Cardiologist (physician) and Advanced Practice Providers (APPs- Physician Assistants and Nurse Practitioners) who all work together to provide you with the care you need, when you need it.   You may see any of the following providers on your designated Care Team at your next follow up: [name] Dr Glori Bickers . Dr Loralie Champagne . Darrick Grinder, NP . Lyda Jester, PA . Audry Riles, PharmD   Please be sure to bring in all your medications bottles to every appointment.

## 2020-02-10 LAB — T3, FREE: T3, Free: 3 pg/mL (ref 2.0–4.4)

## 2020-02-10 NOTE — Progress Notes (Signed)
ICD Remote  

## 2020-02-11 DIAGNOSIS — I5022 Chronic systolic (congestive) heart failure: Secondary | ICD-10-CM | POA: Diagnosis not present

## 2020-02-11 DIAGNOSIS — I7103 Dissection of thoracoabdominal aorta: Secondary | ICD-10-CM | POA: Diagnosis not present

## 2020-02-11 DIAGNOSIS — Q600 Renal agenesis, unilateral: Secondary | ICD-10-CM | POA: Diagnosis not present

## 2020-02-19 DIAGNOSIS — M25551 Pain in right hip: Secondary | ICD-10-CM | POA: Diagnosis not present

## 2020-02-19 DIAGNOSIS — M5431 Sciatica, right side: Secondary | ICD-10-CM | POA: Diagnosis not present

## 2020-02-19 DIAGNOSIS — M542 Cervicalgia: Secondary | ICD-10-CM | POA: Diagnosis not present

## 2020-02-19 DIAGNOSIS — M5442 Lumbago with sciatica, left side: Secondary | ICD-10-CM | POA: Diagnosis not present

## 2020-02-23 ENCOUNTER — Telehealth (HOSPITAL_COMMUNITY): Payer: Self-pay | Admitting: Pharmacist

## 2020-02-23 NOTE — Telephone Encounter (Signed)
Patient Advocate Encounter   Received notification from [facility] [employer] Medicaid that prior authorization for Delene Loll is required.   PA submitted on [facility] [employer] Confirmation #: [id number] Recipient ID:  [id number]  Status is pending   Will continue to follow.  Audry Riles, PharmD, BCPS, BCCP, CPP Heart Failure Clinic Pharmacist [phone]

## 2020-02-25 NOTE — Telephone Encounter (Signed)
Advanced Heart Failure Patient Advocate Encounter  Prior Authorization for Delene Loll has been approved.    PA# [id number] Effective dates: 02/23/2020 - 02/17/2021  Patients co-pay is $3.00  Audry Riles, PharmD, BCPS, BCCP, CPP Heart Failure Clinic Pharmacist [phone]

## 2020-03-15 ENCOUNTER — Encounter (HOSPITAL_COMMUNITY): Payer: Medicaid Other

## 2020-03-15 DIAGNOSIS — Z03818 Encounter for observation for suspected exposure to other biological agents ruled out: Secondary | ICD-10-CM | POA: Diagnosis not present

## 2020-03-16 ENCOUNTER — Other Ambulatory Visit: Payer: Self-pay

## 2020-03-16 ENCOUNTER — Ambulatory Visit (HOSPITAL_COMMUNITY)
Admission: RE | Admit: 2020-03-16 | Discharge: 2020-03-16 | Disposition: A | Discharge status: Home / Self Care | Payer: Medicaid Other | Source: Ambulatory Visit | Attending: Internal Medicine | Admitting: Internal Medicine

## 2020-03-16 VITALS — BP 130/78 | HR 75 | Wt 155.4 lb

## 2020-03-16 DIAGNOSIS — I5022 Chronic systolic (congestive) heart failure: Secondary | ICD-10-CM | POA: Diagnosis not present

## 2020-03-16 LAB — BASIC METABOLIC PANEL
Anion gap: 11 (ref 5–15)
BUN: 30 mg/dL — ABNORMAL HIGH (ref 6–20)
CO2: 29 mmol/L (ref 22–32)
Calcium: 9.2 mg/dL (ref 8.9–10.3)
Chloride: 99 mmol/L (ref 98–111)
Creatinine, Ser: 1.81 mg/dL — ABNORMAL HIGH (ref 0.61–1.24)
GFR calc Af Amer: 47 mL/min — ABNORMAL LOW (ref >60)
GFR calc non Af Amer: 41 mL/min — ABNORMAL LOW (ref >60)
Glucose, Bld: 102 mg/dL — ABNORMAL HIGH (ref 70–99)
Potassium: 3.3 mmol/L — ABNORMAL LOW (ref 3.5–5.1)
Sodium: 139 mmol/L (ref 135–145)

## 2020-03-16 NOTE — Progress Notes (Signed)
Pt added on to nurse sch to get ReDS clip, he feels he may have extra fluid on board.  He states he took an extra 40 mg of Torsemide yesterday and it was his day to take Metolazone which he did also, he states he does feel much better today after urinating a lot overnight   ReDS clip 41%  Discussed w/Dr Bensimhon, he feels this could be abnormally high due to pt's MR.  Get BMET to check renal function.  Bmet done, advised pt I will call him later on today w/lab results.

## 2020-03-16 NOTE — Progress Notes (Signed)
ReDS Vest / Clip - 03/16/20 0900      ReDS Vest / Clip   Station Marker  C    Ruler Value  24    ReDS Value Range  (!) High volume overload    ReDS Actual Value  41

## 2020-03-16 NOTE — Patient Instructions (Signed)
Labs done, I will call you later with results  Keep follow up as scheduled on Mon 3/29 with Dr Haroldine Laws

## 2020-03-18 DIAGNOSIS — M542 Cervicalgia: Secondary | ICD-10-CM | POA: Diagnosis not present

## 2020-03-18 DIAGNOSIS — M545 Low back pain: Secondary | ICD-10-CM | POA: Diagnosis not present

## 2020-03-18 DIAGNOSIS — M5431 Sciatica, right side: Secondary | ICD-10-CM | POA: Diagnosis not present

## 2020-03-18 DIAGNOSIS — M5442 Lumbago with sciatica, left side: Secondary | ICD-10-CM | POA: Diagnosis not present

## 2020-03-22 ENCOUNTER — Other Ambulatory Visit: Payer: Self-pay

## 2020-03-22 ENCOUNTER — Ambulatory Visit (HOSPITAL_COMMUNITY)
Admission: RE | Admit: 2020-03-22 | Discharge: 2020-03-22 | Disposition: A | Discharge status: Home / Self Care | Payer: Medicaid Other | Source: Ambulatory Visit | Attending: Internal Medicine | Admitting: Internal Medicine

## 2020-03-22 VITALS — BP 106/64 | HR 88 | Wt 157.2 lb

## 2020-03-22 DIAGNOSIS — E785 Hyperlipidemia, unspecified: Secondary | ICD-10-CM | POA: Insufficient documentation

## 2020-03-22 DIAGNOSIS — Z7902 Long term (current) use of antithrombotics/antiplatelets: Secondary | ICD-10-CM | POA: Insufficient documentation

## 2020-03-22 DIAGNOSIS — N189 Chronic kidney disease, unspecified: Secondary | ICD-10-CM | POA: Diagnosis not present

## 2020-03-22 DIAGNOSIS — I340 Nonrheumatic mitral (valve) insufficiency: Secondary | ICD-10-CM | POA: Diagnosis not present

## 2020-03-22 DIAGNOSIS — Z91040 Latex allergy status: Secondary | ICD-10-CM | POA: Diagnosis not present

## 2020-03-22 DIAGNOSIS — Z888 Allergy status to other drugs, medicaments and biological substances status: Secondary | ICD-10-CM | POA: Diagnosis not present

## 2020-03-22 DIAGNOSIS — Z91018 Allergy to other foods: Secondary | ICD-10-CM | POA: Diagnosis not present

## 2020-03-22 DIAGNOSIS — I472 Ventricular tachycardia: Secondary | ICD-10-CM | POA: Diagnosis not present

## 2020-03-22 DIAGNOSIS — I130 Hypertensive heart and chronic kidney disease with heart failure and stage 1 through stage 4 chronic kidney disease, or unspecified chronic kidney disease: Secondary | ICD-10-CM | POA: Diagnosis not present

## 2020-03-22 DIAGNOSIS — Z85850 Personal history of malignant neoplasm of thyroid: Secondary | ICD-10-CM | POA: Insufficient documentation

## 2020-03-22 DIAGNOSIS — I5022 Chronic systolic (congestive) heart failure: Secondary | ICD-10-CM | POA: Diagnosis not present

## 2020-03-22 DIAGNOSIS — I252 Old myocardial infarction: Secondary | ICD-10-CM | POA: Diagnosis not present

## 2020-03-22 DIAGNOSIS — Z91013 Allergy to seafood: Secondary | ICD-10-CM | POA: Diagnosis not present

## 2020-03-22 DIAGNOSIS — Z7982 Long term (current) use of aspirin: Secondary | ICD-10-CM | POA: Insufficient documentation

## 2020-03-22 DIAGNOSIS — I5042 Chronic combined systolic (congestive) and diastolic (congestive) heart failure: Secondary | ICD-10-CM | POA: Diagnosis not present

## 2020-03-22 DIAGNOSIS — G4730 Sleep apnea, unspecified: Secondary | ICD-10-CM | POA: Diagnosis not present

## 2020-03-22 DIAGNOSIS — I10 Essential (primary) hypertension: Secondary | ICD-10-CM | POA: Diagnosis not present

## 2020-03-22 DIAGNOSIS — I255 Ischemic cardiomyopathy: Secondary | ICD-10-CM | POA: Diagnosis not present

## 2020-03-22 DIAGNOSIS — M1990 Unspecified osteoarthritis, unspecified site: Secondary | ICD-10-CM | POA: Diagnosis not present

## 2020-03-22 DIAGNOSIS — Z79899 Other long term (current) drug therapy: Secondary | ICD-10-CM | POA: Diagnosis not present

## 2020-03-22 DIAGNOSIS — Z951 Presence of aortocoronary bypass graft: Secondary | ICD-10-CM | POA: Diagnosis not present

## 2020-03-22 DIAGNOSIS — Z882 Allergy status to sulfonamides status: Secondary | ICD-10-CM | POA: Insufficient documentation

## 2020-03-22 DIAGNOSIS — M109 Gout, unspecified: Secondary | ICD-10-CM | POA: Diagnosis not present

## 2020-03-22 DIAGNOSIS — J45909 Unspecified asthma, uncomplicated: Secondary | ICD-10-CM | POA: Insufficient documentation

## 2020-03-22 DIAGNOSIS — I4729 Other ventricular tachycardia: Secondary | ICD-10-CM

## 2020-03-22 DIAGNOSIS — Z91041 Radiographic dye allergy status: Secondary | ICD-10-CM | POA: Insufficient documentation

## 2020-03-22 DIAGNOSIS — I2510 Atherosclerotic heart disease of native coronary artery without angina pectoris: Secondary | ICD-10-CM | POA: Diagnosis not present

## 2020-03-22 DIAGNOSIS — Z95810 Presence of automatic (implantable) cardiac defibrillator: Secondary | ICD-10-CM | POA: Diagnosis not present

## 2020-03-22 LAB — BASIC METABOLIC PANEL
Anion gap: 13 (ref 5–15)
BUN: 40 mg/dL — ABNORMAL HIGH (ref 6–20)
CO2: 28 mmol/L (ref 22–32)
Calcium: 9.6 mg/dL (ref 8.9–10.3)
Chloride: 99 mmol/L (ref 98–111)
Creatinine, Ser: 1.98 mg/dL — ABNORMAL HIGH (ref 0.61–1.24)
GFR calc Af Amer: 42 mL/min — ABNORMAL LOW (ref >60)
GFR calc non Af Amer: 36 mL/min — ABNORMAL LOW (ref >60)
Glucose, Bld: 125 mg/dL — ABNORMAL HIGH (ref 70–99)
Potassium: 4.2 mmol/L (ref 3.5–5.1)
Sodium: 140 mmol/L (ref 135–145)

## 2020-03-22 LAB — BRAIN NATRIURETIC PEPTIDE: B Natriuretic Peptide: 1710.7 pg/mL — ABNORMAL HIGH (ref 0.0–100.0)

## 2020-03-22 MED ORDER — METOLAZONE 2.5 MG PO TABS: 2.5000 mg | ORAL_TABLET | ORAL | 3 refills | Status: DC

## 2020-03-22 NOTE — Progress Notes (Signed)
Advanced Heart Failure Clinic Note   Patient ID: Chad [name]., male   DOB: Nov 03, 1961, 59 y.o.   MRN: [id number]   Primary Cardiologist:  Dr. Glori Bickers  Reason for Visit: 1 Week Freedom Trial   History of Present Illness: Chad [name] is a 59 y.o. male with history of severe HTN, coronary artery disease status post previous myocardial infarction and bypass surgery in 2006 also DES to native PDA in 2011.  He also has a history congestive heart failure secondary to ischemic cardiomyopathy EF 20-25%   He is s/p single chamber [employer] ICD.  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 Also with significant low back pain s/p 2 surgeries.   He has had episodes of CP but have avoided cath due to previous dissection and solitary kidney.  Myoview 11/2015  1. Evidence for very mild reversibility and ischemia involving the anterolateral wall and a portion of the lateral wall. 2. Large infarct involving the inferior wall. There is dyskinesia in the inferior wall. 3. Left ventricular ejection fraction is 32%. Left ventricle dilatation.  In 11/17 admitted for ICD shocks due to fractured RV lead. Underwent lead extraction and replacement.   We saw him in 2/18 for acute visit due to increasing dyspnea and volume overload. ECHO EF 20-25% moderate RV dysfunction. Moderate AI and severe MR. Ao Root 25mm-> 95mm. In 4/18, he saw Dr. 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 He was referred to [facility] team for further evaluation in 6/18 and felt to  be too early for transplant based on CPX.  Admitted 5/6-05/01/18 with chest pain. Troponins negative and EKG was unchanged. Chest pain thought to be related to HTN and possibly anxiety. He declined celexa. He was instructed to take extra 50 mg hydralazine for SBP >140 and 1 mg cardura for SBP >160.  Repeat CPX in 1/21  Peak VO2: 18.7 -> 15.8  (48% predicted peak VO2) VE/VCO2 slope: stable at34  Echo 12/31/19: EF 15-20% RV moderately down. Severe MR/moderate TR  RHC 01/19/20 RA = 1 RV = 32/1 PA = 32/11 (21) PCW = 9 Fick cardiac output/index = 4.0/2.1 PVR = 3.0 WU Ao sat = 98% PA sat = 65%, 65%  Here for routine f/u.Says he is feeling much better taking metolazone once a week + extra as needed. Fluid builds up throughout the week then he empties out. No CP. SOB improved. Currently no orthopnea or PND. BP at home 90-low 100s  Cardiac studies:  CPX 01/05/20 FVC 2.70 (65%)    FEV1 2.09 (64%)     FEV1/FVC 77 (98%)     MVV 105 (71%)     Resting HR: 73 Standing HR: 71 Peak HR: 112  (69% age predicted max HR)  BP rest: 104/68 Standing BP: 92/64 BP peak: 108/60  Peak VO2: 15.8 (48% predicted peak VO2)  VE/VCO2 slope: 34  OUES: 1.40  Peak RER: 0.96  Ventilatory Threshold: 12.9 (39% predicted or measured peak VO2)  VE/MVV: 33%  O2pulse: 9  (60% predicted O2pulse)    CPX 05/15/18 FVC 2.86 (67%)  FEV1 2.26 (68%)     FEV1/FVC 79 (100%)     MVV 106 (70%)  Resting HR: 71 Peak HR: 110  (67% age predicted max HR) BP rest: 100/64 BP peak: 118/66 Peak VO2: 18.7 (56% predicted peak VO2) VE/VCO2 slope: 34 OUES: 1.45 Peak RER: 1.04 Ventilatory Threshold: 11.8 (35% predicted and 63% measured peak VO2) VE/MVV: 43% O2pulse: 12  (80% predicted O2pulse)  CPX 4/18  FVC 3.11 (73%)    FEV1 2.40 (71%)     FEV1/FVC 77 (97%)     MVV 117 (77%) Resting HR: 75 Peak HR: 134  (81% age predicted max HR) BP rest: 120/68 BP peak: 146/82 Peak VO2: 18.6 (55%  predicted peak VO2) VE/VCO2 slope: 31 OUES: 2.01 Peak RER: 1.05 Ventilatory Threshold: 16.0 (47% predicted or measured peak VO2) VE/MVV: 49% O2pulse: 13  (76% predicted O2pulse)  04/18/2018 20-25%.  3/18: ECHO EF 20-25% Moderate AI, moderate to severe MR, moderate TR. Ao Root 50cm 10/22/2015: ECHO EF 20-25% 11/26/12 ECHO EF 35-40%  Review of systems complete and found to be negative unless listed in HPI.   Past Medical History:  Diagnosis Date  . AICD (automatic cardioverter/defibrillator) present   . Anemia   . Anginal pain ([place])   . Anxiety   . Aortic dissection, thoracoabdominal ([place])    7/10: Type I s/p repair  . Arthritis   . Asthma   . CAD (coronary artery disease)    a. s/p CABG 2006;  b. DES to PDA 2011 (cath: Dx not seen, dRCA/PDA tx with DES; S-PDA occluded (culprit), S-Dx occluded, S-RI and OM ok, L-LAD ok  . Carotid stenosis    dopplers 2011: 0-39% bilat.  . Chest pain syndrome   . CHF (congestive heart failure) ([place])   . Chronic bronchitis ([place])   . Chronic lower back pain   . Chronic systolic heart failure (HCC)    a. 12/13 ECHO: EF 35-40%, sept, apical & posterobasal HK, LV mod dil & sys fx mod reduced, mild AI, MV mild reg, TV mild reg  . Complication of anesthesia    "difficult to wake afterwards a couple of times" and patient states he has woken during surgery   . 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 Dyspnea    patient denies at 12/06/2018 appt   . Family history of adverse reaction to anesthesia    "sister hard to wake up"  . GERD (gastroesophageal reflux disease)   . Gout   . Heart murmur   . HLD (hyperlipidemia)   . HTN (hypertension)    severe  . Myocardial infarction [facility])    "many" (11/07/2016)  . PONV (postoperative nausea and vomiting)   . Sleep apnea    does not use mask   . Thyroid cancer ([place])    Hertle Cell    Current Outpatient Medications  Medication Sig Dispense  Refill  . albuterol (PROVENTIL HFA;VENTOLIN HFA) 108 (90 Base) MCG/ACT inhaler Inhale 2 puffs into the lungs every 6 (six) hours as needed for wheezing or shortness of breath. 1 Inhaler 0  . Alirocumab (PRALUENT) 75 MG/ML SOAJ Inject 1 pen into the skin every 14 (fourteen) days. 2 pen 11  . allopurinol (ZYLOPRIM) 100 MG tablet Take 50 mg by mouth daily.    [name] aspirin EC 81 MG tablet Take 81 mg by mouth every evening.    . carvedilol (COREG) 25 MG tablet Take 12.5 mg by mouth  2 (two) times daily with a meal.    . clopidogrel (PLAVIX) 75 MG tablet Take 75 mg by mouth every evening.     . cyanocobalamin (,VITAMIN B-12,) 1000 MCG/ML injection Inject 1,000 mcg into the muscle every 14 (fourteen) days.    . cyclobenzaprine (FLEXERIL) 10 MG tablet Take 10 mg by mouth 3 (three) times daily as needed for muscle spasms.    [name] doxazosin (CARDURA) 1 MG tablet Take 1 tablet (1 mg total) by mouth every 12 (twelve) hours as needed (for systolic BP > 0000000 mm Hg.). 90 tablet 3  . fluticasone (FLONASE) 50 MCG/ACT nasal spray Place 2 sprays into both nostrils daily as needed for allergies or rhinitis.     . hydrALAZINE (APRESOLINE) 50 MG tablet Take 75 mg by mouth in the morning and at bedtime.    [name] ipratropium-albuterol (DUONEB) 0.5-2.5 (3) MG/3ML SOLN Take 3 mLs by nebulization every 6 (six) hours as needed (wheezing, sob).    . metolazone (ZAROXOLYN) 2.5 MG tablet Take 1 tablet (2.5 mg total) by mouth once a week. Every Monday and as needed 30 tablet 3  . MITIGARE 0.6 MG CAPS Take 2 capsules by mouth two times daily    . nitroGLYCERIN (NITROSTAT) 0.4 MG SL tablet Place 0.4 mg under the tongue every 5 (five) minutes as needed for chest pain.    . Oxycodone HCl 20 MG TABS Take 20 mg by mouth every 6 (six) hours as needed.     . potassium chloride SA (KLOR-CON) 20 MEQ tablet Take 40 mEq by mouth 2 (two) times daily. Extra tab when you take metolazone    . sacubitril-valsartan (ENTRESTO) 97-103 MG Take 1 tablet by mouth  2 (two) times daily. 180 tablet 3  . sildenafil (VIAGRA) 100 MG tablet Take 50 mg by mouth daily as needed for erectile dysfunction.    . torsemide (DEMADEX) 20 MG tablet Take 3 tablets (60 mg total) by mouth 2 (two) times daily. 180 tablet 3   No current facility-administered medications for this encounter.   Facility-Administered Medications Ordered in Other Encounters  Medication Dose Route Frequency Provider Last Rate Last Admin  . sodium chloride flush (NS) 0.9 % injection 3 mL  3 mL Intravenous Q12H Celine Dishman, Shaune Pascal, MD        Allergies  Allergen Reactions  . Contrast Media [Iodinated Diagnostic Agents] Anaphylaxis  . Iohexol Anaphylaxis and Other (See Comments)    PT HAS ANAPHYLAXIS WITH CONTRAST MEDIA!  . Lipitor [Atorvastatin Calcium] Anaphylaxis and Other (See Comments)    Large doses  . Shellfish Allergy Anaphylaxis  . Sulfa Antibiotics Shortness Of Breath and Swelling  . Sulfonamide Derivatives Shortness Of Breath and Swelling  . Almond Oil Itching    FACIAL/MOUTH ITCHING  . Metrizamide Swelling    SWELLING REACTION UNSPECIFIED   . Zocor [Simvastatin] Other (See Comments)    Muscle cramps  . Food Itching    Shellfish, peaches, almonds, apples & kiwis  . Latex Rash and Other (See Comments)    With long periods of exposure  . Peach [Prunus Persica] Itching    Vital Signs: Vitals:   03/22/20 1303  BP: 106/64  Pulse: 88  SpO2: 97%  Weight: 71.3 kg (157 lb 4 oz)   Filed Weights   03/22/20 1303  Weight: 71.3 kg (157 lb 4 oz)    Wt Readings from Last 3 Encounters:  03/22/20 71.3 kg (157 lb 4 oz)  03/16/20 70.5 kg (155 lb 6 oz)  02/09/20 75.2 kg (165 lb 12.8 oz)   PHYSICAL EXAM: General:  Thin. Fatigued appearing. No resp difficulty HEENT: normal Neck: supple. JVP 6. Carotids 2+ bilat; no bruits. No lymphadenopathy or thryomegaly appreciated. Cor: PMI nondisplaced. Regular rate & rhythm. 2/6 MR Lungs: clear Abdomen: soft, nontender, nondistended. No  hepatosplenomegaly. No bruits or masses. Good bowel sounds. Extremities: no cyanosis, clubbing, rash, edema Neuro: alert & orientedx3, cranial nerves grossly intact. moves all 4 extremities w/o difficulty. Affect pleasant   ASSESSMENT/PLAN:  1. Chronic systolic HF: Ischemic cardiomyopathy.  Echo (2/16) with EF 25%. S/p [employer] ICD. Echo 3/18. EF 20-25%. Echo 04/18/18: EF 20-25%, grade 2 DD\ - ECHO  12/31/19 showed EF 15- 20% RV moderately down, severe MR - CPX 01/05/20 with pVO2: 15.8 (48% predicted peak VO2), slope 34 RER 0.96 Standing BP: 92/64 BP peak: 108/60  VE/VCO2 slope: 34  - RHC 1/21 reassuring hemodynamics - He has seen w Dr. Prescott Gum in the past and given previous dissection and aortic root replacement likely not a candidate for VAD here. - Now improved with addition of metolazone but still NYHA III - Continue Entresto 97/103 bid - Continue carvedilol 12.5 mg twice a day. (recently cut back due to progressive HF symptoms)   - Continue hydralazine 75 mg bid. No nitrates with sildenafil use - Continue spironolactone 12.5 mg daily. - Continue metolazone 2.5 weekly + PRN - Discussed with [facility] who will see him back in near future. If not candidate for heart-kidney transplant or VAD could consider MitraClip as I think MR is significantly contributing to symptoms - though he is more like Garceno patient then Coapt  2. CAD s/p CABG 2006:  - Cardiolite 11/2015 with evidence for mild reversibility involving the anterolateral wall and a portion of the lateral wall. - We have avoided left heart cath due to dissection and CKD with solitary kidney (lost kidney due to dissection) - No current s/s of ischemia - Continue Plavix and ASA 81 mg daily.   - Statin intolerance at any dose. Also unable to tolerate Zetia - has been referred to Lipid Clinic for possible PCSK-9   3. CKD in setting of solitary kidney s/p dissection - Check labs today  4. HTN:  - Blood  pressure well controlled. Continue current regimen.  5. H/o Type I aortic dissection: Follows with CVTS.  - He has seen w Dr. Prescott Gum. Given previous dissection and aortic root replacement likely not candidate for VAD. - Echo 04/18/18: aortic root, ascending aorta, and aortic arch all mildly dilated - repeat ECHO 12/2019: Ao Root diam: 5.10 cm, Ao Asc diam: 4.30 cm  6. NSVT - in the setting of severe CM - continue Coreg - labs today  7. MR  - Severe on ECHO - See MitraClip discussion above   Glori Bickers, MD 1:28 PM

## 2020-03-22 NOTE — Patient Instructions (Signed)
Labs done today, we will call you for abnormal results  Your physician recommends that you schedule a follow-up appointment in: 2 months  If you have any questions or concerns before your next appointment please send [place] a message through [place] or call our office at [phone].  At the [facility], you and your health needs are our priority. As part of our continuing mission to provide you with exceptional heart care, we have created designated Provider Care Teams. These Care Teams include your primary Cardiologist (physician) and Advanced Practice Providers (APPs- Physician Assistants and Nurse Practitioners) who all work together to provide you with the care you need, when you need it.   You may see any of the following providers on your designated Care Team at your next follow up: [name] Dr Glori Bickers . Dr Loralie Champagne . Darrick Grinder, NP . Lyda Jester, PA . Audry Riles, PharmD   Please be sure to bring in all your medications bottles to every appointment.

## 2020-03-25 ENCOUNTER — Ambulatory Visit: Payer: Medicaid Other | Attending: Internal Medicine

## 2020-03-25 DIAGNOSIS — Z23 Encounter for immunization: Secondary | ICD-10-CM

## 2020-03-25 NOTE — Progress Notes (Signed)
   [id number] Vaccination Clinic  Name:  Chad [name].    MRN: [id number] DOB: 10-14-1961  03/25/2020  [name] was observed post Covid-19 immunization for 30 minutes based on pre-vaccination screening without incident. He was provided with Vaccine Information Sheet and instruction to access the V-Safe system.   [name] was instructed to call 911 with any severe reactions post vaccine: [name] Difficulty breathing  . Swelling of face and throat  . A fast heartbeat  . A bad rash all over body  . Dizziness and weakness   Immunizations Administered    Name Date Dose VIS Date Route   Pfizer COVID-19 Vaccine 03/25/2020  8:42 AM 0.3 mL 12/05/2019 Intramuscular   Manufacturer: [place]   Lot: [id number]   [place]: [id number]

## 2020-04-15 ENCOUNTER — Ambulatory Visit: Payer: Medicaid Other | Attending: Internal Medicine

## 2020-04-15 DIAGNOSIS — M25552 Pain in left hip: Secondary | ICD-10-CM | POA: Diagnosis not present

## 2020-04-15 DIAGNOSIS — M542 Cervicalgia: Secondary | ICD-10-CM | POA: Diagnosis not present

## 2020-04-15 DIAGNOSIS — M25551 Pain in right hip: Secondary | ICD-10-CM | POA: Diagnosis not present

## 2020-04-15 DIAGNOSIS — Z23 Encounter for immunization: Secondary | ICD-10-CM

## 2020-04-15 DIAGNOSIS — M545 Low back pain: Secondary | ICD-10-CM | POA: Diagnosis not present

## 2020-04-15 NOTE — Progress Notes (Signed)
   [id number] Vaccination Clinic  Name:  Chad [name].    MRN: [id number] DOB: 07/25/1961  04/15/2020  Mr. Eckman was observed post Covid-19 immunization for 15 minutes without incident. He was provided with Vaccine Information Sheet and instruction to access the V-Safe system.   Mr. Bohning was instructed to call 911 with any severe reactions post vaccine: [name] Difficulty breathing  . Swelling of face and throat  . A fast heartbeat  . A bad rash all over body  . Dizziness and weakness   Immunizations Administered    Name Date Dose VIS Date Route   Pfizer COVID-19 Vaccine 04/15/2020 11:48 AM 0.3 mL 02/18/2019 Intramuscular   Manufacturer: [place]   Lot: [id number]   [place]: [id number]

## 2020-04-19 ENCOUNTER — Ambulatory Visit: Payer: Medicaid Other

## 2020-04-28 ENCOUNTER — Ambulatory Visit (INDEPENDENT_AMBULATORY_CARE_PROVIDER_SITE_OTHER): Payer: Medicaid Other | Admitting: Family Medicine

## 2020-04-28 ENCOUNTER — Other Ambulatory Visit: Payer: Self-pay

## 2020-04-28 ENCOUNTER — Ambulatory Visit (HOSPITAL_COMMUNITY)
Admission: RE | Admit: 2020-04-28 | Discharge: 2020-04-28 | Disposition: A | Discharge status: Home / Self Care | Payer: Medicaid Other | Source: Ambulatory Visit | Attending: Family Medicine | Admitting: Family Medicine

## 2020-04-28 VITALS — BP 100/68 | HR 82 | Ht 71.0 in | Wt 155.0 lb

## 2020-04-28 DIAGNOSIS — I5022 Chronic systolic (congestive) heart failure: Secondary | ICD-10-CM | POA: Diagnosis not present

## 2020-04-28 LAB — COMPREHENSIVE METABOLIC PANEL
ALT: 22 IU/L (ref 0–44)
AST: 20 IU/L (ref 0–40)
Albumin/Globulin Ratio: 1.7 (ref 1.2–2.2)
Albumin: 4.5 g/dL (ref 3.8–4.9)
Alkaline Phosphatase: 92 IU/L (ref 39–117)
BUN/Creatinine Ratio: 26 — ABNORMAL HIGH (ref 9–20)
BUN: 60 mg/dL — ABNORMAL HIGH (ref 6–24)
Bilirubin Total: 0.8 mg/dL (ref 0.0–1.2)
CO2: 32 mmol/L — ABNORMAL HIGH (ref 20–29)
Calcium: 8.5 mg/dL — ABNORMAL LOW (ref 8.7–10.2)
Chloride: 96 mmol/L (ref 96–106)
Creatinine, Ser: 2.28 mg/dL — ABNORMAL HIGH (ref 0.76–1.27)
GFR calc Af Amer: 36 mL/min/{1.73_m2} — ABNORMAL LOW (ref >59)
GFR calc non Af Amer: 31 mL/min/{1.73_m2} — ABNORMAL LOW (ref >59)
Globulin, Total: 2.6 g/dL (ref 1.5–4.5)
Glucose: 108 mg/dL — ABNORMAL HIGH (ref 65–99)
Potassium: 3.3 mmol/L — ABNORMAL LOW (ref 3.5–5.2)
Sodium: 138 mmol/L (ref 134–144)
Total Protein: 7.1 g/dL (ref 6.0–8.5)

## 2020-04-28 LAB — CBC
Hematocrit: 41.7 % (ref 37.5–51.0)
Hemoglobin: 14.1 g/dL (ref 13.0–17.7)
MCH: 27.2 pg (ref 26.6–33.0)
MCHC: 33.8 g/dL (ref 31.5–35.7)
MCV: 80 fL (ref 79–97)
Platelets: 111 10*3/uL — ABNORMAL LOW (ref 150–450)
RBC: 5.19 x10E6/uL (ref 4.14–5.80)
RDW: 15.8 % — ABNORMAL HIGH (ref 11.6–15.4)
WBC: 3.4 10*3/uL (ref 3.4–10.8)

## 2020-04-28 LAB — MAGNESIUM: Magnesium: 1.9 mg/dL (ref 1.6–2.3)

## 2020-04-28 LAB — BRAIN NATRIURETIC PEPTIDE: BNP: 2504.3 pg/mL — ABNORMAL HIGH (ref 0.0–100.0)

## 2020-04-28 NOTE — Progress Notes (Signed)
SUBJECTIVE:   CHIEF COMPLAINT / HPI:   Fatigue: Patient reports that for the past month he has been having an increasing feeling of being unwell.  He reports that he took a zinc and magnesium tablet about 1 month ago and his blood pressure dropped pretty low.  He reports that he then stopped taking that medication but then wanted to experiment with it further so took it 2 or 3 times more and since then he has been off some of his blood pressure medications because it has been low.  He reports episodes of lightheadedness, dizziness and feeling like he is almost about to pass out.  He denies any LOC.  He denies any falls.  He denies any shortness of breath or lower extremity swelling.  He denies any use of his Viagra.  He reports that he is continuing to take his Entresto, torsemide and metolazone weekly but has stopped his carvedilol and hydralazine.  Patient reports that he still has times where he feels like his blood pressure is dropping but has not been high which previously has been.  Patient reports that he has been trying to get in touch with his cardiologist but has been unsuccessful and as he has an appointment later in the month but believes he needs to be seen sooner.  Patient currently with history of HFrEF with EF of 15 to 20% and is currently being considered as possible LVAD candidate with Dr. Haroldine Laws.  Patient reports that he has only had to use his sublingual nitroglycerin 1-2 times this past month for chest pain and it has not been severe.  Patient states that he has not had any blood in his stool, dark stools.  He denies any vomiting, constipation or diarrhea.  He states that he is having some general abdominal discomfort which she cannot describe.  He denies any acute pain in his abdomen.  He states that this all started when he began feeling more tired than normal.  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  PERTINENT  PMH / PSH: HTN, CAD s/p previous myocardial infarction and bypass surgery in 2006 also DES to native PDA in 2011; HF R EF 2/2 ischemic cardiomyopathy EF 20-25%; s/p single chamber [employer] ICD.  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  OBJECTIVE:  BP 100/68   Pulse 82   Ht 5\' 11"  (1.803 m)   Wt 155 lb (70.3 kg)   SpO2 98%   BMI 21.62 kg/m   General: NAD, pleasant Neck: Supple, mild JVP Cardiovascular: Nondisplaced PMI. Regular rate and rhythm. 4/6 murmur.  No LE edema Respiratory: CTA BL, no crackles, wheezes, normal work of breathing Gastrointestinal: soft, nontender, nondistended, normoactive BS Psych: AOx3, appropriate affect  ASSESSMENT/PLAN:   SYSTOLIC HEART FAILURE, CHRONIC Patient with increasing fatigue with no signs of fluid overload.  Could be that patient is over diuresing however patient with severe heart failure and followed closely at heart failure clinic.  He has been unable to reach the office in order to schedule an appointment.  Patient states he is going out of town on Friday and believes that he needs to be  seen in their office.  EKG here with no signs of acute MI.  Could be that patient is experiencing fatigue from his decompensating heart failure.   VSS.  Patient reporting drop in blood pressure and likely that orthostatics would be positive.  Did not collect in office as management would not change.  Patient has self stopped his hydralazine and carvedilol.  Given that carvedilol has mortality benefit likely that cardiology will need to discuss restarting this medication with him.  Could be that patient has over diuresed but does appear euvolemic on exam. -Called on-call heart failure  provider in the hospital who will have their clinic to schedule patient to be seen ASAP.  -Will obtain BNP, CMP, CBC, magnesium and TSH for their office to reference when he follows up with them hopefully later this week. -Discussed strict return precautions and patient voiced understanding    Chad Camilah Spillman, DO PGY-3, [place]

## 2020-04-28 NOTE — Patient Instructions (Addendum)
Thank you for coming to see me today. It was a pleasure! Today we talked about:   I have talked with the heart failure on-call provider. The heart failure clinic will call you later today and try to get you in sometime this week to be seen before your trip.  They plan to call you at [phone] which is the phone number we have on file.    We have collected some blood work for them to be able to have prior to seeing you. If you develop any worsening chest pain, shortness of breath or a loss of consciousness and please do not hesitate to go to the emergency room  Please follow-up with the heart failure clinic.  If you have any questions or concerns, please do not hesitate to call the office at [phone].  Take Care,   [place] Jakyron Fabro, DO

## 2020-04-29 ENCOUNTER — Ambulatory Visit (HOSPITAL_BASED_OUTPATIENT_CLINIC_OR_DEPARTMENT_OTHER)
Admission: RE | Admit: 2020-04-29 | Discharge: 2020-04-29 | Disposition: A | Discharge status: Home / Self Care | Payer: Medicaid Other | Source: Ambulatory Visit | Attending: Internal Medicine | Admitting: Internal Medicine

## 2020-04-29 ENCOUNTER — Encounter (HOSPITAL_COMMUNITY): Payer: Self-pay | Admitting: Internal Medicine

## 2020-04-29 VITALS — BP 102/72 | HR 88 | Wt 156.2 lb

## 2020-04-29 DIAGNOSIS — I2510 Atherosclerotic heart disease of native coronary artery without angina pectoris: Secondary | ICD-10-CM | POA: Diagnosis not present

## 2020-04-29 DIAGNOSIS — I340 Nonrheumatic mitral (valve) insufficiency: Secondary | ICD-10-CM | POA: Diagnosis not present

## 2020-04-29 DIAGNOSIS — I5022 Chronic systolic (congestive) heart failure: Secondary | ICD-10-CM

## 2020-04-29 DIAGNOSIS — N179 Acute kidney failure, unspecified: Secondary | ICD-10-CM | POA: Diagnosis not present

## 2020-04-29 LAB — TSH: TSH: 3.02 u[IU]/mL (ref 0.450–4.500)

## 2020-04-29 MED ORDER — ENTRESTO 49-51 MG PO TABS
1.0000 | ORAL_TABLET | Freq: Two times a day (BID) | ORAL | 3 refills | Status: DC
Start: 2020-04-29 — End: 2020-05-03

## 2020-04-29 MED ORDER — TORSEMIDE 20 MG PO TABS: 40.0000 mg | ORAL_TABLET | Freq: Two times a day (BID) | ORAL | 3 refills | Status: DC

## 2020-04-29 NOTE — Progress Notes (Signed)
Advanced Heart Failure Clinic Note   Patient ID: Chad Bark., male   DOB: 22-Sep-1961, 59 y.o.   MRN: [id number]   Primary Cardiologist:  Dr. Glori Bickers  Reason for Visit: 1 Week Freedom Trial   History of Present Illness: Chad [name] is a 59 y.o. male with history of severe HTN, coronary artery disease status post previous myocardial infarction and bypass surgery in 2006 also DES to native PDA in 2011.  He also has a history congestive heart failure secondary to ischemic cardiomyopathy EF 20-25%   He is s/p single chamber [employer] ICD.  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 Also with significant low back pain s/p 2 surgeries.   He has had episodes of CP but have avoided cath due to previous dissection and solitary kidney.  Myoview 11/2015  1. Evidence for very mild reversibility and ischemia involving the anterolateral wall and a portion of the lateral wall. 2. Large infarct involving the inferior wall. There is dyskinesia in the inferior wall. 3. Left ventricular ejection fraction is 32%. Left ventricle dilatation.  In 11/17 admitted for ICD shocks due to fractured RV lead. Underwent lead extraction and replacement.   We saw him in 2/18 for acute visit due to increasing dyspnea and volume overload. ECHO EF 20-25% moderate RV dysfunction. Moderate AI and severe MR. Ao Root 61mm-> 6mm. In 4/18, he saw Dr. 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 He was referred to [facility] Transplant team for further evaluation in 6/18 and felt to  be too early for transplant based on CPX.  Admitted 5/6-05/01/18 with chest pain. Troponins negative and EKG was unchanged. Chest pain thought to be related to HTN and possibly anxiety. He declined celexa. He was instructed to take extra 50 mg hydralazine for SBP >140 and 1 mg cardura for SBP >160.  Repeat CPX in 1/21  Peak VO2: 18.7 -> 15.8  (48% predicted peak VO2) VE/VCO2 slope: stable at34  Echo 12/31/19: EF 15-20% RV moderately down. Severe MR/moderate TR  RHC 01/19/20 RA = 1 RV = 32/1 PA = 32/11 (21) PCW = 9 Fick cardiac output/index = 4.0/2.1 PVR = 3.0 WU Ao sat = 98% PA sat = 65%, 65%  Here for routine f/u. Says SBP running 95-110 - mostly ~100. So hasn't taken carvedilol for a month. Taking Entresto routinely. Only taking hydralazine a few times per months when BP can tolerate. Taking metolazone every Monday and weight stable 142-145. Urine output dropping off a bit. Mildly dizzy when he stands up. Belly hurts him all the time. No distension. No melena or BRBPR.   Labs from yesterday creatinine up from 1.8 -> 2.3 LFTs normal. K 3.3 BNP 2,504  Cardiac studies:  CPX 01/05/20 FVC 2.70 (65%)    FEV1 2.09 (64%)     FEV1/FVC 77 (98%)     MVV 105 (71%)     Resting HR: 73 Standing HR: 71 Peak HR: 112  (69% age predicted max HR)  BP rest: 104/68 Standing BP: 92/64 BP peak: 108/60  Peak VO2: 15.8 (48% predicted peak VO2)  VE/VCO2 slope: 34  OUES: 1.40  Peak RER: 0.96  Ventilatory Threshold: 12.9 (39% predicted or measured peak VO2)  VE/MVV: 33%  O2pulse: 9  (60% predicted O2pulse)    CPX 05/15/18 FVC 2.86 (67%)    FEV1 2.26 (68%)     FEV1/FVC 79 (100%)     MVV 106 (70%)  Resting HR: 71 Peak HR: 110  (67% age predicted max HR) BP rest: 100/64 BP peak: 118/66 Peak VO2: 18.7 (56% predicted peak VO2) VE/VCO2 slope: 34 OUES: 1.45 Peak RER: 1.04 Ventilatory Threshold: 11.8 (35% predicted and 63% measured peak VO2) VE/MVV: 43% O2pulse: 12  (80%  predicted O2pulse)  CPX 4/18  FVC 3.11 (73%)    FEV1 2.40 (71%)     FEV1/FVC 77 (97%)     MVV 117 (77%) Resting HR: 75 Peak HR: 134  (81% age predicted max HR) BP rest: 120/68 BP peak: 146/82 Peak VO2: 18.6 (55% predicted peak VO2) VE/VCO2 slope: 31 OUES: 2.01 Peak RER: 1.05 Ventilatory Threshold: 16.0 (47% predicted or measured peak VO2) VE/MVV: 49% O2pulse: 13  (76% predicted O2pulse)  04/18/2018 20-25%.  3/18: ECHO EF 20-25% Moderate AI, moderate to severe MR, moderate TR. Ao Root 50cm 10/22/2015: ECHO EF 20-25% 11/26/12 ECHO EF 35-40%  Review of systems complete and found to be negative unless listed in HPI.   Past Medical History:  Diagnosis Date  . AICD (automatic cardioverter/defibrillator) present   . Anemia   . Anginal pain ([place])   . Anxiety   . Aortic dissection, thoracoabdominal ([place])    7/10: Type I s/p repair  . Arthritis   . Asthma   . CAD (coronary artery disease)    a. s/p CABG 2006;  b. DES to PDA 2011 (cath: Dx not seen, dRCA/PDA tx with DES; S-PDA occluded (culprit), S-Dx occluded, S-RI and OM ok, L-LAD ok  . Carotid stenosis    dopplers 2011: 0-39% bilat.  . Chest pain syndrome   . CHF (congestive heart failure) ([place])   . Chronic bronchitis ([place])   . Chronic lower back pain   . Chronic systolic heart failure (HCC)    a. 12/13 ECHO: EF 35-40%, sept, apical & posterobasal HK, LV mod dil & sys fx mod reduced, mild AI, MV mild reg, TV mild reg  . Complication of anesthesia    "difficult to wake afterwards a couple of times" and patient states he has woken during surgery   . 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 Dyspnea    patient denies at 12/06/2018 appt   . Family history of adverse reaction to anesthesia    "sister hard to wake up"  . GERD (gastroesophageal reflux disease)   . Gout   . Heart murmur   . HLD (hyperlipidemia)   . HTN (hypertension)    severe  . Myocardial  infarction [facility])    "many" (11/07/2016)  . PONV (postoperative nausea and vomiting)   . Sleep apnea    does not use mask   . Thyroid cancer ([employer])    Hertle Cell    Current Outpatient Medications  Medication Sig Dispense Refill  . albuterol (PROVENTIL HFA;VENTOLIN HFA) 108 (90 Base) MCG/ACT inhaler Inhale 2 puffs into the lungs every 6 (six) hours as needed for wheezing or shortness of breath. 1 Inhaler 0  . Alirocumab (PRALUENT) 75 MG/ML SOAJ Inject 1 pen into the skin every 14 (fourteen) days. 2 pen 11  .  allopurinol (ZYLOPRIM) 100 MG tablet Take 50 mg by mouth daily.    [name] aspirin EC 81 MG tablet Take 81 mg by mouth every evening.    . clopidogrel (PLAVIX) 75 MG tablet Take 75 mg by mouth every evening.     . cyanocobalamin (,VITAMIN B-12,) 1000 MCG/ML injection Inject 1,000 mcg into the muscle every 14 (fourteen) days.    . cyclobenzaprine (FLEXERIL) 10 MG tablet Take 10 mg by mouth 3 (three) times daily as needed for muscle spasms.    [name] doxazosin (CARDURA) 1 MG tablet Take 1 tablet (1 mg total) by mouth every 12 (twelve) hours as needed (for systolic BP > 0000000 mm Hg.). 90 tablet 3  . fluticasone (FLONASE) 50 MCG/ACT nasal spray Place 2 sprays into both nostrils daily as needed for allergies or rhinitis.     [name] ipratropium-albuterol (DUONEB) 0.5-2.5 (3) MG/3ML SOLN Take 3 mLs by nebulization every 6 (six) hours as needed (wheezing, sob).    . metolazone (ZAROXOLYN) 2.5 MG tablet Take 1 tablet (2.5 mg total) by mouth once a week. Every Monday and as needed 30 tablet 3  . MITIGARE 0.6 MG CAPS Take 2 capsules by mouth two times daily    . nitroGLYCERIN (NITROSTAT) 0.4 MG SL tablet Place 0.4 mg under the tongue every 5 (five) minutes as needed for chest pain.    . Oxycodone HCl 20 MG TABS Take 20 mg by mouth every 6 (six) hours as needed.     . potassium chloride SA (KLOR-CON) 20 MEQ tablet Take 40 mEq by mouth 2 (two) times daily. Extra tab when you take metolazone    . sacubitril-valsartan  (ENTRESTO) 97-103 MG Take 1 tablet by mouth 2 (two) times daily. 180 tablet 3  . sildenafil (VIAGRA) 100 MG tablet Take 50 mg by mouth daily as needed for erectile dysfunction.    . torsemide (DEMADEX) 20 MG tablet Take 3 tablets (60 mg total) by mouth 2 (two) times daily. 180 tablet 3   No current facility-administered medications for this encounter.    Allergies  Allergen Reactions  . Contrast Media [Iodinated Diagnostic Agents] Anaphylaxis  . Iohexol Anaphylaxis and Other (See Comments)    PT HAS ANAPHYLAXIS WITH CONTRAST MEDIA!  . Lipitor [Atorvastatin Calcium] Anaphylaxis and Other (See Comments)    Large doses  . Shellfish Allergy Anaphylaxis  . Sulfa Antibiotics Shortness Of Breath and Swelling  . Sulfonamide Derivatives Shortness Of Breath and Swelling  . Almond Oil Itching    FACIAL/MOUTH ITCHING  . Metrizamide Swelling    SWELLING REACTION UNSPECIFIED   . Zocor [Simvastatin] Other (See Comments)    Muscle cramps  . Food Itching    Shellfish, peaches, almonds, apples & kiwis  . Latex Rash and Other (See Comments)    With long periods of exposure  . Peach [Prunus Persica] Itching    Vital Signs: Vitals:   04/29/20 1156  BP: 102/72  Pulse: 88  SpO2: 100%  Weight: 70.9 kg (156 lb 3.2 oz)   Filed Weights   04/29/20 1156  Weight: 70.9 kg (156 lb 3.2 oz)    Wt Readings from Last 3 Encounters:  04/29/20 70.9 kg (156 lb 3.2 oz)  04/28/20 70.3 kg (155 lb)  03/22/20 71.3 kg (157 lb 4 oz)   PHYSICAL EXAM: General:  Thin. Fatigued appearing. No resp difficulty HEENT: normal Neck: supple. no JVD. Carotids 2+ bilat; no bruits. No lymphadenopathy or thryomegaly appreciated. Cor: PMI nondisplaced. Regular rate & rhythm. 2.6 TR  3/6 MR Lungs: clear Abdomen: soft, nontender, nondistended. No hepatosplenomegaly. No bruits or masses. Good bowel sounds. Extremities: no cyanosis, clubbing, rash, edema Neuro: alert & orientedx3, cranial nerves grossly intact. moves all 4  extremities w/o difficulty. Affect pleasant  ASSESSMENT/PLAN:  1. Chronic systolic HF: Ischemic cardiomyopathy.  Echo (2/16) with EF 25%. S/p [employer] ICD. Echo 3/18. EF 20-25%. Echo 04/18/18: EF 20-25%, grade 2 DD\ - ECHO  12/31/19 showed EF 15- 20% RV moderately down, severe MR - CPX 01/05/20 with pVO2: 15.8 (48% predicted peak VO2), slope 34 RER 0.96 Standing BP: 92/64 BP peak: 108/60  VE/VCO2 slope: 34  - RHC 1/21 volume status ok but CI ~ 2.0 - He has seen w Dr. Prescott Gum in the past and given previous dissection and aortic root replacement he is not a candidate for VAD here. - He has been seen by [facility] and [facility] - Dr. Wannetta Sender. Would consider high-risk VAD when ready. If not canddiate could consider MitraClip as I think MR is significantly contributing to symptoms - though he is more like MitraFR patient then Coapt - He is worse today. NYHA III-IIIb. Suspect over diuresed with AKI but BNP still high in setting of severe MR. I think he is nearing stage for advanced therapies  - He is going out of town this weekend. So plan will be to hold torsemide 60 bid for 2 days and restart Sunday at 40 bid. Stop metolazone. Cut Entresto to 49/51 bid - Continue to hold carvedilol 12.5 mg twice a day. (recently cut back due to progressive HF symptoms)   - Continue to holdhydralazine 75 mg bid. No nitrates with sildenafil use - Continue spironolactone 12.5 mg daily. - Check labs next Friday when he gets back. If worsening, will need admission with repeat RHC   2. CAD s/p CABG 2006:  - Cardiolite 11/2015 with evidence for mild reversibility involving the anterolateral wall and a portion of the lateral wall. - We have avoided left heart cath due to dissection and CKD with solitary kidney (lost kidney due to dissection) - No current s/s of ischeima - Continue Plavix and ASA 81 mg daily.   - Statin intolerance at any dose. Also unable to tolerate Zetia - has been referred to  [facility] for possible PCSK-9   3. 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 Plan as above  4. HTN:  - Blood pressure low. Plan as above  5. H/o Type I aortic dissection: Follows with CVTS.  - He has seen w Dr. Prescott Gum. Given previous dissection and aortic root replacement likely not candidate for VAD. - Echo 04/18/18: aortic root, ascending aorta, and aortic arch all mildly dilated - ECHO 12/2019: Ao Root diam: 5.10 cm, Ao Asc diam: 4.30 cm  6. MR  - Severe on ECHO and exam - See MitraClip discussion above  Total time spent 45 minutes. Over half that time spent discussing above.   Glori Bickers, MD 12:42 PM

## 2020-04-29 NOTE — Assessment & Plan Note (Signed)
Patient with increasing fatigue with no signs of fluid overload.  Could be that patient is over diuresing however patient with severe heart failure and followed closely at heart failure clinic.  He has been unable to reach the office in order to schedule an appointment.  Patient states he is going out of town on Friday and believes that he needs to be seen in their office.  EKG here with no signs of acute MI.  Could be that patient is experiencing fatigue from his decompensating heart failure.   VSS.  Patient reporting drop in blood pressure and likely that orthostatics would be positive.  Did not collect in office as management would not change.  Patient has self stopped his hydralazine and carvedilol.  Given that carvedilol has mortality benefit likely that cardiology will need to discuss restarting this medication with him.  Could be that patient has over diuresed but does appear euvolemic on exam. -Called on-call heart failure provider in the hospital who will have their clinic to schedule patient to be seen ASAP.  -Will obtain BNP, CMP, CBC, magnesium and TSH for their office to reference when he follows up with them hopefully later this week. -Discussed strict return precautions and patient voiced understanding

## 2020-04-29 NOTE — Progress Notes (Signed)
ReDS Vest / Clip - 04/29/20 1400      ReDS Vest / Clip   Station Marker  C    Ruler Value  28    ReDS Value Range  (!) High volume overload    ReDS Actual Value  40    Anatomical Comments  sitting

## 2020-04-29 NOTE — Patient Instructions (Signed)
HOLD Torsemide COMPLETELY for 2 days:  Restart it on Sunday at 40 mg (2 tabs) Twice daily   Decrease Entresto to 49/51 mg Twice daily [place] needed next Fri 5/14  Your physician recommends that you schedule a follow-up appointment in: 2-3 weeks  If you have any questions or concerns before your next appointment please send [place] a message through [place] or call our office at [phone].  At the [facility], you and your health needs are our priority. As part of our continuing mission to provide you with exceptional heart care, we have created designated Provider Care Teams. These Care Teams include your primary Cardiologist (physician) and Advanced Practice Providers (APPs- Physician Assistants and Nurse Practitioners) who all work together to provide you with the care you need, when you need it.   You may see any of the following providers on your designated Care Team at your next follow up: [name] Dr Glori Bickers . Dr Loralie Champagne . Darrick Grinder, NP . Lyda Jester, PA . Audry Riles, PharmD   Please be sure to bring in all your medications bottles to every appointment.

## 2020-04-30 ENCOUNTER — Emergency Department (HOSPITAL_COMMUNITY): Payer: Medicaid Other

## 2020-04-30 ENCOUNTER — Other Ambulatory Visit (HOSPITAL_COMMUNITY): Payer: Self-pay

## 2020-04-30 ENCOUNTER — Other Ambulatory Visit: Payer: Self-pay

## 2020-04-30 ENCOUNTER — Inpatient Hospital Stay (HOSPITAL_COMMUNITY)
Admission: EM | Admit: 2020-04-30 | Discharge: 2020-05-03 | DRG: 291 | Disposition: A | Discharge status: Other Acute Inpatient Hospital | Payer: Medicaid Other | Attending: Internal Medicine | Admitting: Internal Medicine

## 2020-04-30 ENCOUNTER — Encounter (HOSPITAL_COMMUNITY): Payer: Self-pay | Admitting: Emergency Medicine

## 2020-04-30 DIAGNOSIS — I5020 Unspecified systolic (congestive) heart failure: Secondary | ICD-10-CM | POA: Diagnosis present

## 2020-04-30 DIAGNOSIS — Z825 Family history of asthma and other chronic lower respiratory diseases: Secondary | ICD-10-CM

## 2020-04-30 DIAGNOSIS — E875 Hyperkalemia: Secondary | ICD-10-CM | POA: Diagnosis not present

## 2020-04-30 DIAGNOSIS — Z85850 Personal history of malignant neoplasm of thyroid: Secondary | ICD-10-CM | POA: Diagnosis not present

## 2020-04-30 DIAGNOSIS — I5023 Acute on chronic systolic (congestive) heart failure: Secondary | ICD-10-CM

## 2020-04-30 DIAGNOSIS — I130 Hypertensive heart and chronic kidney disease with heart failure and stage 1 through stage 4 chronic kidney disease, or unspecified chronic kidney disease: Principal | ICD-10-CM | POA: Diagnosis present

## 2020-04-30 DIAGNOSIS — Z951 Presence of aortocoronary bypass graft: Secondary | ICD-10-CM

## 2020-04-30 DIAGNOSIS — E785 Hyperlipidemia, unspecified: Secondary | ICD-10-CM | POA: Diagnosis present

## 2020-04-30 DIAGNOSIS — I472 Ventricular tachycardia: Secondary | ICD-10-CM | POA: Diagnosis not present

## 2020-04-30 DIAGNOSIS — Z452 Encounter for adjustment and management of vascular access device: Secondary | ICD-10-CM | POA: Diagnosis not present

## 2020-04-30 DIAGNOSIS — Z91040 Latex allergy status: Secondary | ICD-10-CM

## 2020-04-30 DIAGNOSIS — N189 Chronic kidney disease, unspecified: Secondary | ICD-10-CM | POA: Diagnosis not present

## 2020-04-30 DIAGNOSIS — I5082 Biventricular heart failure: Secondary | ICD-10-CM | POA: Diagnosis not present

## 2020-04-30 DIAGNOSIS — I255 Ischemic cardiomyopathy: Secondary | ICD-10-CM | POA: Diagnosis not present

## 2020-04-30 DIAGNOSIS — D696 Thrombocytopenia, unspecified: Secondary | ICD-10-CM | POA: Diagnosis present

## 2020-04-30 DIAGNOSIS — Z905 Acquired absence of kidney: Secondary | ICD-10-CM | POA: Diagnosis not present

## 2020-04-30 DIAGNOSIS — I252 Old myocardial infarction: Secondary | ICD-10-CM

## 2020-04-30 DIAGNOSIS — Z888 Allergy status to other drugs, medicaments and biological substances status: Secondary | ICD-10-CM

## 2020-04-30 DIAGNOSIS — R570 Cardiogenic shock: Secondary | ICD-10-CM | POA: Diagnosis not present

## 2020-04-30 DIAGNOSIS — M109 Gout, unspecified: Secondary | ICD-10-CM | POA: Diagnosis not present

## 2020-04-30 DIAGNOSIS — Z882 Allergy status to sulfonamides status: Secondary | ICD-10-CM

## 2020-04-30 DIAGNOSIS — Z20822 Contact with and (suspected) exposure to covid-19: Secondary | ICD-10-CM | POA: Diagnosis present

## 2020-04-30 DIAGNOSIS — Z8249 Family history of ischemic heart disease and other diseases of the circulatory system: Secondary | ICD-10-CM | POA: Diagnosis not present

## 2020-04-30 DIAGNOSIS — N1832 Chronic kidney disease, stage 3b: Secondary | ICD-10-CM | POA: Diagnosis not present

## 2020-04-30 DIAGNOSIS — Z79899 Other long term (current) drug therapy: Secondary | ICD-10-CM | POA: Diagnosis not present

## 2020-04-30 DIAGNOSIS — I10 Essential (primary) hypertension: Secondary | ICD-10-CM | POA: Diagnosis not present

## 2020-04-30 DIAGNOSIS — J45909 Unspecified asthma, uncomplicated: Secondary | ICD-10-CM | POA: Diagnosis present

## 2020-04-30 DIAGNOSIS — N179 Acute kidney failure, unspecified: Secondary | ICD-10-CM | POA: Diagnosis present

## 2020-04-30 DIAGNOSIS — I340 Nonrheumatic mitral (valve) insufficiency: Secondary | ICD-10-CM | POA: Diagnosis not present

## 2020-04-30 DIAGNOSIS — Z955 Presence of coronary angioplasty implant and graft: Secondary | ICD-10-CM

## 2020-04-30 DIAGNOSIS — Z7902 Long term (current) use of antithrombotics/antiplatelets: Secondary | ICD-10-CM

## 2020-04-30 DIAGNOSIS — R0602 Shortness of breath: Secondary | ICD-10-CM | POA: Diagnosis not present

## 2020-04-30 DIAGNOSIS — I2510 Atherosclerotic heart disease of native coronary artery without angina pectoris: Secondary | ICD-10-CM | POA: Diagnosis not present

## 2020-04-30 DIAGNOSIS — E876 Hypokalemia: Secondary | ICD-10-CM | POA: Diagnosis not present

## 2020-04-30 DIAGNOSIS — Z91041 Radiographic dye allergy status: Secondary | ICD-10-CM

## 2020-04-30 DIAGNOSIS — Z7982 Long term (current) use of aspirin: Secondary | ICD-10-CM

## 2020-04-30 DIAGNOSIS — M545 Low back pain: Secondary | ICD-10-CM | POA: Diagnosis present

## 2020-04-30 DIAGNOSIS — Z91013 Allergy to seafood: Secondary | ICD-10-CM

## 2020-04-30 DIAGNOSIS — I351 Nonrheumatic aortic (valve) insufficiency: Secondary | ICD-10-CM | POA: Diagnosis not present

## 2020-04-30 DIAGNOSIS — I083 Combined rheumatic disorders of mitral, aortic and tricuspid valves: Secondary | ICD-10-CM | POA: Diagnosis present

## 2020-04-30 DIAGNOSIS — I517 Cardiomegaly: Secondary | ICD-10-CM | POA: Diagnosis not present

## 2020-04-30 LAB — COMPREHENSIVE METABOLIC PANEL
ALT: 33 U/L (ref 0–44)
AST: 40 U/L (ref 15–41)
Albumin: 4 g/dL (ref 3.5–5.0)
Alkaline Phosphatase: 83 U/L (ref 38–126)
Anion gap: 15 (ref 5–15)
BUN: 62 mg/dL — ABNORMAL HIGH (ref 6–20)
CO2: 25 mmol/L (ref 22–32)
Calcium: 8.3 mg/dL — ABNORMAL LOW (ref 8.9–10.3)
Chloride: 98 mmol/L (ref 98–111)
Creatinine, Ser: 2.59 mg/dL — ABNORMAL HIGH (ref 0.61–1.24)
GFR calc Af Amer: 31 mL/min — ABNORMAL LOW (ref >60)
GFR calc non Af Amer: 26 mL/min — ABNORMAL LOW (ref >60)
Glucose, Bld: 104 mg/dL — ABNORMAL HIGH (ref 70–99)
Potassium: 4.4 mmol/L (ref 3.5–5.1)
Sodium: 138 mmol/L (ref 135–145)
Total Bilirubin: 1.3 mg/dL — ABNORMAL HIGH (ref 0.3–1.2)
Total Protein: 6.8 g/dL (ref 6.5–8.1)

## 2020-04-30 LAB — RESPIRATORY PANEL BY RT PCR (FLU A&B, COVID)
Influenza A by PCR: NEGATIVE
Influenza B by PCR: NEGATIVE
SARS Coronavirus 2 by RT PCR: NEGATIVE

## 2020-04-30 LAB — CBC
HCT: 43.1 % (ref 39.0–52.0)
Hemoglobin: 13.8 g/dL (ref 13.0–17.0)
MCH: 27.2 pg (ref 26.0–34.0)
MCHC: 32 g/dL (ref 30.0–36.0)
MCV: 85 fL (ref 80.0–100.0)
Platelets: 96 10*3/uL — ABNORMAL LOW (ref 150–400)
RBC: 5.07 MIL/uL (ref 4.22–5.81)
RDW: 15.8 % — ABNORMAL HIGH (ref 11.5–15.5)
WBC: 3.3 10*3/uL — ABNORMAL LOW (ref 4.0–10.5)
nRBC: 0 % (ref 0.0–0.2)

## 2020-04-30 LAB — LACTIC ACID, PLASMA: Lactic Acid, Venous: 1.2 mmol/L (ref 0.5–1.9)

## 2020-04-30 LAB — TROPONIN I (HIGH SENSITIVITY)
Troponin I (High Sensitivity): 21 ng/L — ABNORMAL HIGH (ref <18)
Troponin I (High Sensitivity): 19 ng/L — ABNORMAL HIGH (ref <18)

## 2020-04-30 LAB — BRAIN NATRIURETIC PEPTIDE: B Natriuretic Peptide: 3460.9 pg/mL — ABNORMAL HIGH (ref 0.0–100.0)

## 2020-04-30 MED ORDER — ONDANSETRON HCL 4 MG/2ML IJ SOLN
4.0000 mg | Freq: Four times a day (QID) | INTRAMUSCULAR | Status: DC | PRN
  Administered 2020-05-02: 4 mg via INTRAVENOUS
  Filled 2020-04-30: qty 2

## 2020-04-30 MED ORDER — SODIUM CHLORIDE 0.9 % IV SOLN
250.0000 mL | INTRAVENOUS | Status: DC | PRN
  Administered 2020-05-01: 14:00:00 250 mL via INTRAVENOUS

## 2020-04-30 MED ORDER — ALBUTEROL SULFATE HFA 108 (90 BASE) MCG/ACT IN AERS: 2.0000 | INHALATION_SPRAY | Freq: Four times a day (QID) | RESPIRATORY_TRACT | Status: DC | PRN

## 2020-04-30 MED ORDER — ACETAMINOPHEN 325 MG PO TABS
650.0000 mg | ORAL_TABLET | Freq: Once | ORAL | Status: AC
  Administered 2020-04-30: 650 mg via ORAL
  Filled 2020-04-30: qty 2

## 2020-04-30 MED ORDER — SODIUM CHLORIDE 0.9% FLUSH
3.0000 mL | Freq: Two times a day (BID) | INTRAVENOUS | Status: DC
  Administered 2020-05-01 – 2020-05-03 (×5): 3 mL via INTRAVENOUS

## 2020-04-30 MED ORDER — HEPARIN SODIUM (PORCINE) 5000 UNIT/ML IJ SOLN
5000.0000 [IU] | Freq: Three times a day (TID) | INTRAMUSCULAR | Status: DC
  Administered 2020-05-01 – 2020-05-02 (×3): 5000 [IU] via SUBCUTANEOUS
  Filled 2020-04-30 (×4): qty 1

## 2020-04-30 MED ORDER — CLOPIDOGREL BISULFATE 75 MG PO TABS
75.0000 mg | ORAL_TABLET | Freq: Every evening | ORAL | Status: DC
  Administered 2020-05-01: 75 mg via ORAL
  Filled 2020-04-30: qty 1

## 2020-04-30 MED ORDER — IPRATROPIUM-ALBUTEROL 0.5-2.5 (3) MG/3ML IN SOLN
3.0000 mL | Freq: Four times a day (QID) | RESPIRATORY_TRACT | Status: DC | PRN
  Administered 2020-04-30: 3 mL via RESPIRATORY_TRACT
  Filled 2020-04-30: qty 3

## 2020-04-30 MED ORDER — ASPIRIN 81 MG PO CHEW
324.0000 mg | CHEWABLE_TABLET | Freq: Once | ORAL | Status: AC
  Administered 2020-04-30: 20:00:00 324 mg via ORAL
  Filled 2020-04-30: qty 4

## 2020-04-30 MED ORDER — FLUTICASONE PROPIONATE 50 MCG/ACT NA SUSP
2.0000 | Freq: Every day | NASAL | Status: DC | PRN
  Filled 2020-04-30: qty 16

## 2020-04-30 MED ORDER — ACETAMINOPHEN 325 MG PO TABS
650.0000 mg | ORAL_TABLET | ORAL | Status: DC | PRN
  Administered 2020-05-02: 650 mg via ORAL
  Filled 2020-04-30: qty 2

## 2020-04-30 MED ORDER — CYCLOBENZAPRINE HCL 10 MG PO TABS
10.0000 mg | ORAL_TABLET | Freq: Three times a day (TID) | ORAL | Status: DC | PRN
  Administered 2020-04-30 – 2020-05-03 (×3): 10 mg via ORAL
  Filled 2020-04-30 (×5): qty 1

## 2020-04-30 MED ORDER — OXYCODONE HCL 5 MG PO TABS
20.0000 mg | ORAL_TABLET | Freq: Four times a day (QID) | ORAL | Status: DC | PRN
  Administered 2020-04-30 – 2020-05-03 (×8): 20 mg via ORAL
  Filled 2020-04-30 (×8): qty 4

## 2020-04-30 MED ORDER — SODIUM CHLORIDE 0.9% FLUSH: 3.0000 mL | INTRAVENOUS | Status: DC | PRN

## 2020-04-30 MED ORDER — ASPIRIN EC 81 MG PO TBEC
81.0000 mg | DELAYED_RELEASE_TABLET | Freq: Every evening | ORAL | Status: DC
  Administered 2020-05-01 – 2020-05-02 (×2): 81 mg via ORAL
  Filled 2020-04-30 (×2): qty 1

## 2020-04-30 NOTE — ED Notes (Signed)
Attempted report 

## 2020-04-30 NOTE — H&P (Addendum)
Cardiology Admission History and Physical:   Patient ID: Chad [name]. MRN: [id number]; DOB: [birth year]   Admission date: 04/30/2020  Primary Care Provider: Kathrene Alu, MD Primary Cardiologist: Glori Bickers, MD  Primary Electrophysiologist:  None   Chief Complaint:  Shortness of breath  Patient Profile:   Chad [name]. is a 59 y.o. male with severe HTN, CAD s/p prior CABG and PCI, ischemic CM s/p ICD, HFrEF (20%), type I aortic dissection s/p repair but complicated by renal involvement, Hurthle cell lesion s/p sub-total thyroidectomy who presents with shortness of breath.   History of Present Illness:   Chad [name] is followed by Dr Haroldine Laws for his severe chronic heart failure. He was seen by Dr. Haroldine Laws yesterday in clinic. He was thought to be slightly volume down. Several temporary medication changes were suggested. Admission was recommended however the patient declined as he had travel plans. His plan was to check back in next week.   Since going home the patient has not felt well. He complains of worsening shortness of breath with most any activity. He has a headache, feels very sluggish and tired, has bilateral arm tingling, and has mild chest pain. He typically has chest pain symptoms when he has worsening heart failure. He has been taking his medications as prescribed. He denies syncope, presyncope, ICD discharges, nausea, vomiting. He has been eating and drinking normally. He denies fevers, chills, cough, dysuria, polyuria, leg swelling, PND, orthopnea.   Heart Pathway Score:     Past Medical History:  Diagnosis Date  . AICD (automatic cardioverter/defibrillator) present   . Anemia   . Anginal pain ([place])   . Anxiety   . Aortic dissection, thoracoabdominal ([place])    7/10: Type I s/p repair  . Arthritis   . Asthma   . CAD (coronary artery disease)    a. s/p CABG 2006;  b. DES to PDA 2011 (cath: Dx not seen, dRCA/PDA tx with DES; S-PDA occluded  (culprit), S-Dx occluded, S-RI and OM ok, L-LAD ok  . Carotid stenosis    dopplers 2011: 0-39% bilat.  . Chest pain syndrome   . CHF (congestive heart failure) ([place])   . Chronic bronchitis ([place])   . Chronic lower back pain   . Chronic systolic heart failure (HCC)    a. 12/13 ECHO: EF 35-40%, sept, apical & posterobasal HK, LV mod dil & sys fx mod reduced, mild AI, MV mild reg, TV mild reg  . Complication of anesthesia    "difficult to wake afterwards a couple of times" and patient states he has woken during surgery   . 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 Dyspnea    patient denies at 12/06/2018 appt   . Family history of adverse reaction to anesthesia    "sister hard to wake up"  . GERD (gastroesophageal reflux disease)   . Gout   . Heart murmur   . HLD (hyperlipidemia)   . HTN (hypertension)    severe  . Myocardial infarction [facility])    "many" (11/07/2016)  . PONV (postoperative nausea and vomiting)   . Sleep apnea    does not use mask   . Thyroid cancer ([place])    [employer]    Past Surgical History:  Procedure Laterality Date  . BACK SURGERY    . CARDIAC CATHETERIZATION     "several" (11/07/2016)  . CARDIAC DEFIBRILLATOR PLACEMENT  11/2005   [employer]; Archie Endo  05/09/2011  . CHOLECYSTECTOMY N/A 12/09/2018   Procedure: LAPAROSCOPIC CHOLECYSTECTOMY;  Surgeon: Ralene Ok, MD;  Location: [facility];  Service: General;  Laterality: N/A;  . CORONARY ANGIOPLASTY WITH STENT PLACEMENT     "I've had 1-2 stents" (11/07/2016)  . CORONARY ARTERY BYPASS GRAFT  06/21/2009   "CABG X2"  . CORONARY ARTERY BYPASS GRAFT  06/29/2005   "CABG X7"  . ICD LEAD REMOVAL  11/07/2016  . ICD LEAD REMOVAL N/A 11/07/2016   Procedure: ICD LEAD REMOVAL, INSERTION OF NEW ICD LEAD;  Surgeon: Evans Lance, MD;  Location: [place];  Service: Cardiovascular;  Laterality: N/A;  Dr. Prescott Gum to backup case  . IMPLANTABLE CARDIOVERTER  DEFIBRILLATOR (ICD) GENERATOR CHANGE N/A 12/13/2012   Procedure: ICD GENERATOR CHANGE;  Surgeon: Evans Lance, MD;  Location: [facility] CATH LAB;  Service: Cardiovascular;  Laterality: N/A;  . INGUINAL HERNIA REPAIR Bilateral 06/25/2018   Procedure: LAPAROSCOPIC  BILATERAL INGUINAL HERNIA REPAIRS;  Surgeon: Ralene Ok, MD;  Location: [place];  Service: General;  Laterality: Bilateral;  . INSERTION OF MESH Bilateral 06/25/2018   Procedure: INSERTION OF MESH;  Surgeon: Ralene Ok, MD;  Location: [place];  Service: General;  Laterality: Bilateral;  . [place] SURGERY  01/2001    most recent within 5-10 years  . RIGHT HEART CATH N/A 04/05/2018   Procedure: RIGHT HEART CATH;  Surgeon: Jolaine Artist, MD;  Location: [place] CV LAB;  Service: Cardiovascular;  Laterality: N/A;  . RIGHT HEART CATH N/A 01/19/2020   Procedure: RIGHT HEART CATH;  Surgeon: Jolaine Artist, MD;  Location: [place] CV LAB;  Service: Cardiovascular;  Laterality: N/A;  . SHOULDER ARTHROSCOPY WITH ROTATOR CUFF REPAIR Bilateral   . Status post emergency repair of a type A ascending aortic dissection with a hemiarch reconstruction of the ascending aorta  using a 28-mm Hemashield graft with redo sternotomy and revision of previous bypass grafts in June 2010.    . TESTICLE SURGERY    . THYROIDECTOMY, PARTIAL  06/20/2011  . TONSILLECTOMY    . UMBILICAL HERNIA REPAIR N/A 06/25/2018   Procedure: UMBILICAL HERNIA REPAIR;  Surgeon: Ralene Ok, MD;  Location: [place];  Service: General;  Laterality: N/A;  . VASECTOMY       Medications Prior to Admission: Prior to Admission medications   Medication Sig Start Date End Date Taking? Authorizing Provider  albuterol (PROVENTIL HFA;VENTOLIN HFA) 108 (90 Base) MCG/ACT inhaler Inhale 2 puffs into the lungs every 6 (six) hours as needed for wheezing or shortness of breath. 10/19/17   Mikell, Jeani Sow, MD  Alirocumab (PRALUENT) 75 MG/ML SOAJ Inject 1 pen into the skin every  14 (fourteen) days. 09/12/19   Evans Lance, MD  allopurinol (ZYLOPRIM) 100 MG tablet Take 50 mg by mouth daily.    [provider]  aspirin EC 81 MG tablet Take 81 mg by mouth every evening.    [provider]  clopidogrel (PLAVIX) 75 MG tablet Take 75 mg by mouth every evening.     [provider]  cyanocobalamin (,VITAMIN B-12,) 1000 MCG/ML injection Inject 1,000 mcg into the muscle every 14 (fourteen) days.    [provider]  cyclobenzaprine (FLEXERIL) 10 MG tablet Take 10 mg by mouth 3 (three) times daily as needed for muscle spasms.    [provider]  doxazosin (CARDURA) 1 MG tablet Take 1 tablet (1 mg total) by mouth every 12 (twelve) hours as needed (for systolic BP > 0000000 mm Hg.).  10/10/18   Bensimhon, Shaune Pascal, MD  fluticasone (FLONASE) 50 MCG/ACT nasal spray Place 2 sprays into both nostrils daily as needed for allergies or rhinitis.     [provider]  ipratropium-albuterol (DUONEB) 0.5-2.5 (3) MG/3ML SOLN Take 3 mLs by nebulization every 6 (six) hours as needed (wheezing, sob).    [provider]  metolazone (ZAROXOLYN) 2.5 MG tablet Take 1 tablet (2.5 mg total) by mouth once a week. Every Monday and as needed 03/22/20   Bensimhon, Shaune Pascal, MD  MITIGARE 0.6 MG CAPS Take 2 capsules by mouth two times daily    [provider]  nitroGLYCERIN (NITROSTAT) 0.4 MG SL tablet Place 0.4 mg under the tongue every 5 (five) minutes as needed for chest pain.    [provider]  Oxycodone HCl 20 MG TABS Take 20 mg by mouth every 6 (six) hours as needed.  12/22/19   [provider]  potassium chloride SA (KLOR-CON) 20 MEQ tablet Take 40 mEq by mouth 2 (two) times daily. Extra tab when you take metolazone    [provider]  sacubitril-valsartan (ENTRESTO) 49-51 MG Take 1 tablet by mouth 2 (two) times daily. 04/29/20   Bensimhon, Shaune Pascal, MD  sildenafil (VIAGRA) 100 MG tablet Take 50 mg by mouth daily as  needed for erectile dysfunction.    [provider]  torsemide (DEMADEX) 20 MG tablet Take 2 tablets (40 mg total) by mouth 2 (two) times daily. 04/29/20   Bensimhon, Shaune Pascal, MD     Allergies:    Allergies  Allergen Reactions  . Contrast Media [Iodinated Diagnostic Agents] Anaphylaxis  . Iohexol Anaphylaxis and Other (See Comments)    PT HAS ANAPHYLAXIS WITH CONTRAST MEDIA!  . Lipitor [Atorvastatin Calcium] Anaphylaxis and Other (See Comments)    Large doses  . Shellfish Allergy Anaphylaxis  . Sulfa Antibiotics Shortness Of Breath and Swelling  . Sulfonamide Derivatives Shortness Of Breath and Swelling  . Almond Oil Itching    FACIAL/MOUTH ITCHING  . Metrizamide Swelling    SWELLING REACTION UNSPECIFIED   . Zocor [Simvastatin] Other (See Comments)    Muscle cramps  . Food Itching    Shellfish, peaches, almonds, apples & kiwis  . Latex Rash and Other (See Comments)    With long periods of exposure  . Peach [Prunus Persica] Itching    Social History:   Social History   Socioeconomic History  . Marital status: Divorced    Spouse name: Not on file  . Number of children: Not on file  . Years of education: Not on file  . Highest education level: Not on file  Occupational History  . Occupation: disabled  Tobacco Use  . Smoking status: Never Smoker  . Smokeless tobacco: Never Used  Substance and Sexual Activity  . Alcohol use: No  . Drug use: No  . Sexual activity: Yes  Other Topics Concern  . Not on file  Social History Narrative   Engaged and lives with fiancee and 4 kids.    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    Social Determinants of Health   Financial Resource Strain:   . Difficulty of Paying Living Expenses:   Food Insecurity:   . Worried About Charity fundraiser in the Last Year:   . Arboriculturist in the Last Year:   Transportation Needs:   . Film/video editor (Medical):   [name] Lack of Transportation (Non-Medical):   Physical  Activity:   .  Days of Exercise per Week:   . Minutes of Exercise per Session:   Stress:   . Feeling of Stress :   Social Connections:   . Frequency of Communication with Friends and Family:   . Frequency of Social Gatherings with Friends and Family:   . Attends Religious Services:   . Active Member of Clubs or Organizations:   . Attends Archivist Meetings:   [name] Marital Status:   Intimate Partner Violence:   . Fear of Current or Ex-Partner:   . Emotionally Abused:   [name] Physically Abused:   . Sexually Abused:     Family History:   The patient's family history includes COPD in his father; Coronary artery disease in an other family member; Early death in his father; Heart disease in his father; Hypertension in his father and mother.    ROS:  Please see the history of present illness.  All other ROS reviewed and negative.     Physical Exam/Data:   Vitals:   04/30/20 2000 04/30/20 2030 04/30/20 2100 04/30/20 2130  BP: 96/67 98/69 106/76 108/82  Pulse: 78 77 85 77  Resp: 17 (!) 21 (!) 30 (!) 25  Temp:      TempSrc:      SpO2: 99% 95% 100% 99%  Weight:      Height:       No intake or output data in the 24 hours ending 04/30/20 2150 Last 3 Weights 04/30/2020 04/29/2020 04/28/2020  Weight (lbs) 156 lb 156 lb 3.2 oz 155 lb  Weight (kg) 70.761 kg 70.852 kg 70.308 kg     Body mass index is 21.76 kg/m.  General:  Cachectic, uncomfortable HEENT: normal Lymph: no adenopathy Neck: Very large V waves obscuring JVP Endocrine:  No thryomegaly Vascular: No carotid bruits; FA pulses 2+ bilaterally without bruits  Cardiac:  normal S1, S2; RRR with occasional ectopy, 3/6 holosystolic murmur heard throughout the precordium Lungs:  Crackles at the bilateral lung bases Abd: soft, nontender, no hepatomegaly  Ext: no edema, lukewarm extremities Musculoskeletal:  No deformities, BUE and BLE strength normal and equal Skin: warm and dry  Neuro:  CNs 2-12 intact, no focal abnormalities  noted Psych:  Normal affect    EKG:  The ECG that was done on arrival to the ED was personally reviewed and demonstrates NSR, HR 93, APCs, left axis deviation, left atrial abnormality, nonspecific ST/T wave changes, low voltage in the limb leads  Relevant CV Studies: 01/19/20 RHC RA = 1 RV = 32/1 PA = 32/11 (21) PCW = 9 Fick cardiac output/index = 4.0/2.1 PVR = 3.0 WU Ao sat = 98% PA sat = 65%, 65%  Assessment: 1. Well-compensated hemodynamics   Echo 12/31/19 IMPRESSIONS    1. Left ventricular ejection fraction, by visual estimation, is 15-20%.  The left ventricle has severely decreased function. There is no left  ventricular hypertrophy.  2. Left ventricular diastolic parameters are consistent with Grade III  diastolic dysfunction (restrictive).  3. Severely dilated left ventricular internal cavity size.  4. The left ventricle demonstrates global hypokinesis.  5. Global right ventricle has moderately reduced systolic function.The  right ventricular size is moderately enlarged. No increase in right  ventricular wall thickness.  6. Left atrial size was severely dilated.  7. Right atrial size was moderately dilated.  8. The mitral valve is abnormal. Severe mitral valve regurgitation.  9. The tricuspid valve is normal in structure.  10. Aortic valve regurgitation is moderate.  11. The aortic  valve is tricuspid. Aortic valve regurgitation is moderate.  Mild aortic valve sclerosis without stenosis.  12. The pulmonic valve was normal in structure. Pulmonic valve  regurgitation is trivial.  13. Mildly elevated pulmonary artery systolic pressure.  14. The inferior vena cava is dilated in size with <50% respiratory  variability, suggesting right atrial pressure of 15 mmHg.    CPX 01/05/20: VE/VCO2 slope 34 RER 0.96 Peak Vo2 15.8 (48% predicted)  Laboratory Data:  High Sensitivity Troponin:   Recent Labs  Lab 04/30/20 1937  TROPONINIHS 21*      Chemistry  Recent Labs  Lab 04/28/20 1008 04/30/20 1937  NA 138 138  K 3.3* 4.4  CL 96 98  CO2 32* 25  GLUCOSE 108* 104*  BUN 60* 62*  CREATININE 2.28* 2.59*  CALCIUM 8.5* 8.3*  GFRNONAA 31* 26*  GFRAA 36* 31*  ANIONGAP  --  15    Recent Labs  Lab 04/28/20 1008 04/30/20 1937  PROT 7.1 6.8  ALBUMIN 4.5 4.0  AST 20 40  ALT 22 33  ALKPHOS 92 83  BILITOT 0.8 1.3*   Hematology Recent Labs  Lab 04/28/20 1008 04/30/20 1937  WBC 3.4 3.3*  RBC 5.19 5.07  HGB 14.1 13.8  HCT 41.7 43.1  MCV 80 85.0  MCH 27.2 27.2  MCHC 33.8 32.0  RDW 15.8* 15.8*  PLT 111* 96*   BNP Recent Labs  Lab 04/28/20 1008 04/30/20 1937  BNP 2,504.3* 3,460.9*    DDimer No results for input(s): DDIMER in the last 168 hours.   Radiology/Studies:  DG Chest Portable 1 View  Result Date: 04/30/2020 CLINICAL DATA:  Shortness of breath EXAM: PORTABLE CHEST 1 VIEW COMPARISON:  December 16, 2019 FINDINGS: Again noted is aneurysmal dilatation of the thoracic aorta. This appears relatively stable since prior study. There is a single lead left-sided ICD in place. There is significant cardiomegaly. The patient is status post prior median sternotomy. There is vascular congestion with possible early developing pulmonary edema. There is no pneumothorax. No large pleural effusion. IMPRESSION: 1. Cardiomegaly with volume overload and possible early developing pulmonary edema. 2. 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 Electronically Signed   By: Constance Holster M.D.   On: 04/30/2020 19:56       HEAR Score (for undifferentiated chest pain):     4 (elevated)  Assessment and Plan:  Zamarian Laurice [name]. is a 59 y.o. male with severe HTN, CAD s/p prior CABG and PCI, ischemic CM s/p ICD, HFrEF (20%), type I aortic dissection s/p repair but complicated by renal involvement, Hurthle cell lesion s/p sub-total thyroidectomy who presents with shortness of breath.   #) Chronic iCM, HFrEF (15-20%): full  assessment and excellent note by Dr. Carrolyn Leigh from yesterday. Patient has done poorly since that time. Seems to have worsening functional status. His JVP is very difficult to interpret as it is obscured by profound V waves. I suspect he is slightly volume deplete based on the totality of his exam. Patient agrees. His extremities are lukewarm, though will send lactate to evaluate perfusion. Will need to be seen by HF team this weekend and discuss whether now is the time he would like to pursue advanced therapy options.  - hold torsemide, metolazone - check lactate - add on iron, ferritin, TIBC - TSH recently checked and normal - will need HF team eval in AM - hold entresto tonight, reassess renal function in AM prior to restarting - hold carvedilol, hydral per Dr. Clayborne Dana  recommendation - hold spironolactone given AKI  #) CAD s/p CABG: ECG on arrival to ED without ST elevation, though with nonspecific ST abnormalities. Initial troponin at upper limit of normal. Chest pain seems to be more likely secondary to worsening heart failure than from type I ACS. Currently chest pain free. - repeat troponin - cont plavix, ASA - intolerant of statins and zetia  - undergoing eval for PCSK9 candidacy   #) AKI:  - creatinine continues to increase from 2.3 yesterday to 2.6 - medication adjustments as per above  #) HTN:  - normotensive on presentation today - med adjustments as per above   Severity of Illness: The appropriate patient status for this patient is INPATIENT. Inpatient status is judged to be reasonable and necessary in order to provide the required intensity of service to ensure the patient's safety. The patient's presenting symptoms, physical exam findings, and initial radiographic and laboratory data in the context of their chronic comorbidities is felt to place them at high risk for further clinical deterioration. Furthermore, it is not anticipated that the patient will be medically  stable for discharge from the hospital within 2 midnights of admission. The following factors support the patient status of inpatient.   " The patient's presenting symptoms include SOB. " The worrisome physical exam findings include cool extremities, cardiac murmur. " The initial radiographic and laboratory data are worrisome because of elevated BNP, AKI. " The chronic co-morbidities include HFrEF, CAD, aortic dissection.   * I certify that at the point of admission it is my clinical judgment that the patient will require inpatient hospital care spanning beyond 2 midnights from the point of admission due to high intensity of service, high risk for further deterioration and high frequency of surveillance required.*    For questions or updates, please contact [place] Please consult [email or web address] for contact info under        Signed, Marcie Mowers, MD  04/30/2020 9:50 PM

## 2020-04-30 NOTE — ED Triage Notes (Signed)
Patient arrives to ED with complaints of sudden shortness of breath and chest pain starting today (Chad [name] patient). Patient states yesterday he was in his normal state of heath and today he feels bad. Patient states that the SOB comes and goes but the chest pain radiates dow both arms. Patient has hx of HF, MI, Aortic dissection.

## 2020-04-30 NOTE — ED Provider Notes (Signed)
[facility] EMERGENCY DEPARTMENT Provider Note   CSN: [id number] Arrival date & time: 04/30/20  1841     History Chief Complaint  Patient presents with  . Shortness of Breath  . Chest Pain    Chad [name]. is a 59 y.o. male.  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 Haroldine Laws) -- presents the emergency department today with complaint of chest pain and shortness of breath, worsening today.  Patient had appointment with Dr.Bensimhon yesterday.  Per that note patient had decompensated heart failure with elevated kidney function and elevated BNP.  Patient was resistant to admission yesterday because he was going out of town.  Plan was to recheck labs in 1 week and make decision at that point.  Patient symptoms have continued to progress prompting emergency department visit today.  He has radiation of chest pain and numbness into his bilateral arms.  He has a headache.  No confusion, vomiting, diaphoresis.  He denies any significant lower extremity edema.  No abdominal pains. The onset of this condition was acute. The course is constant. Aggravating factors: none. Alleviating factors: none.          Past Medical History:  Diagnosis Date  . AICD (automatic cardioverter/defibrillator) present   . Anemia   . Anginal pain ([place])   . Anxiety   . Aortic dissection, thoracoabdominal ([place])    7/10: Type I s/p repair  . Arthritis   . Asthma   . CAD (coronary artery disease)    a. s/p CABG 2006;  b. DES to PDA 2011 (cath: Dx not seen, dRCA/PDA tx with DES; S-PDA occluded (culprit), S-Dx occluded, S-RI and OM ok, L-LAD ok  . Carotid stenosis    dopplers 2011: 0-39% bilat.  . Chest pain syndrome   . CHF (congestive heart failure) ([place])   . Chronic bronchitis ([place])   . Chronic lower back pain   . Chronic systolic heart failure (HCC)    a. 12/13 ECHO:  EF 35-40%, sept, apical & posterobasal HK, LV mod dil & sys fx mod reduced, mild AI, MV mild reg, TV mild reg  . Complication of anesthesia    "difficult to wake afterwards a couple of times" and patient states he has woken during surgery   . 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 Dyspnea    patient denies at 12/06/2018 appt   . Family history of adverse reaction to anesthesia    "sister hard to wake up"  . GERD (gastroesophageal reflux disease)   . Gout   . Heart murmur   . HLD (hyperlipidemia)   . HTN (hypertension)    severe  . Myocardial infarction [facility])    "many" (11/07/2016)  . PONV (postoperative nausea and vomiting)   . Sleep apnea    does not use mask   . Thyroid cancer ([place])    [employer]    Patient Active Problem List   Diagnosis Date Noted  . 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 Colicky RUQ abdominal pain 11/30/2018  . Pain in joint of left shoulder 07/05/2018  . 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 Hip region mass, left 06/24/2018  . 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 Inguinal  hernia 02/18/2018  . Prediabetes 12/20/2017  . Chronic kidney disease (CKD), stage II (mild) 06/06/2017  . S/P tonsillectomy 06/06/2017  . S/P rotator cuff surgery 06/06/2017  . S/P partial thyroidectomy 06/06/2017  . 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 Failure of implantable cardioverter-defibrillator (ICD) lead 11/07/2016  . Erectile dysfunction 04/18/2016  . Pain in the chest   . Aneurysm of descending thoracic aorta ([place])   . Nasal folliculitis [id number]  . Acute on chronic systolic CHF (congestive heart failure) ([place]) 04/12/2015  . CKD (chronic kidney disease) stage 3, GFR 30-59 ml/min (HCC) 04/12/2015  . Unstable angina ([place]) 01/25/2015  . Chest pain 10/10/2014  . Health care maintenance  03/19/2014  . Local reaction to tetanus vaccine 03/19/2014  . Neck pain on left side 06/03/2013  . Penile discharge 02/26/2013  . Syncope 01/17/2013  . Chronic combined systolic and diastolic CHF (congestive heart failure) ([place]) 01/16/2013  . Low back pain 04/23/2012  . Chest pain syndrome   . Insomnia 09/14/2011  . 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 Bradycardia 05/03/2011  . Aortic dissection, thoracoabdominal ([place])   . ORTHOSTATIC HYPOTENSION 05/03/2010  . CAROTID BRUIT 01/25/2010  . PANCYTOPENIA 11/01/2009  . Fatigue 10/25/2009  . DISSECTING AORTIC ANEURYSM THORACOABDOMINAL 07/14/2009  . Sciatica of right side 05/18/2009  . Hyperlipidemia 09/29/2008  . Hypertension, benign 09/29/2008  . CAD, ARTERY BYPASS GRAFT 09/29/2008  . SYSTOLIC HEART FAILURE, CHRONIC 09/29/2008  . Implantable cardioverter-defibrillator (ICD) in situ 09/29/2008  . CAD (coronary artery disease) of artery bypass graft 09/29/2008    Past Surgical History:  Procedure Laterality Date  . BACK SURGERY    . CARDIAC CATHETERIZATION     "several" (11/07/2016)  . CARDIAC DEFIBRILLATOR PLACEMENT  11/2005   [employer]; Archie Endo 05/09/2011  . CHOLECYSTECTOMY N/A 12/09/2018   Procedure: LAPAROSCOPIC CHOLECYSTECTOMY;  Surgeon: Ralene Ok, MD;  Location: [facility];  Service: General;  Laterality: N/A;  . CORONARY ANGIOPLASTY WITH STENT PLACEMENT     "I've had 1-2 stents" (11/07/2016)  . CORONARY ARTERY BYPASS GRAFT  06/21/2009   "CABG X2"  . CORONARY ARTERY BYPASS GRAFT  06/29/2005   "CABG X7"  . ICD LEAD REMOVAL  11/07/2016  . ICD LEAD REMOVAL N/A 11/07/2016   Procedure: ICD LEAD REMOVAL, INSERTION OF NEW ICD LEAD;  Surgeon: Evans Lance, MD;  Location: [facility];  Service: Cardiovascular;  Laterality: N/A;  Dr. Prescott Gum to backup case  . IMPLANTABLE CARDIOVERTER DEFIBRILLATOR (ICD) GENERATOR CHANGE N/A 12/13/2012   Procedure: ICD GENERATOR CHANGE;  Surgeon: Evans Lance, MD;  Location: [facility] CATH LAB;  Service: Cardiovascular;  Laterality: N/A;  . INGUINAL HERNIA REPAIR Bilateral 06/25/2018   Procedure: LAPAROSCOPIC  BILATERAL INGUINAL HERNIA REPAIRS;  Surgeon: Ralene Ok, MD;  Location: [facility];  Service: General;  Laterality: Bilateral;  . INSERTION OF MESH Bilateral 06/25/2018   Procedure: INSERTION OF MESH;  Surgeon: Ralene Ok, MD;  Location: [facility];  Service: General;  Laterality: Bilateral;  . [place] SURGERY  01/2001    most recent within 5-10 years  . RIGHT HEART CATH N/A 04/05/2018   Procedure: RIGHT HEART CATH;  Surgeon: Jolaine Artist, MD;  Location: [facility] CV LAB;  Service: Cardiovascular;  Laterality: N/A;  . RIGHT HEART CATH N/A 01/19/2020   Procedure: RIGHT HEART CATH;  Surgeon: Jolaine Artist, MD;  Location: [facility] CV LAB;  Service: Cardiovascular;  Laterality: N/A;  . SHOULDER ARTHROSCOPY WITH ROTATOR CUFF REPAIR Bilateral   .  Status post emergency repair of a type A ascending aortic dissection with a hemiarch reconstruction of the ascending aorta  using a 28-mm Hemashield graft with redo sternotomy and revision of previous bypass grafts in June 2010.    . TESTICLE SURGERY    . THYROIDECTOMY, PARTIAL  06/20/2011  . TONSILLECTOMY    . UMBILICAL HERNIA REPAIR N/A 06/25/2018   Procedure: UMBILICAL HERNIA REPAIR;  Surgeon: Ralene Ok, MD;  Location: [facility];  Service: General;  Laterality: N/A;  . VASECTOMY         Family History  Problem Relation Age of Onset  . Hypertension Father   . Heart disease Father   . Early death Father   . COPD Father   . Hypertension Mother   . Coronary artery disease Other     Social History   Tobacco Use  . Smoking status: Never Smoker  . Smokeless tobacco: Never Used  Substance Use Topics  . Alcohol use: No  . Drug use: No    Home Medications Prior to Admission medications   Medication Sig Start Date End Date Taking? Authorizing Provider  albuterol (PROVENTIL  HFA;VENTOLIN HFA) 108 (90 Base) MCG/ACT inhaler Inhale 2 puffs into the lungs every 6 (six) hours as needed for wheezing or shortness of breath. 10/19/17   Mikell, Jeani Sow, MD  Alirocumab (PRALUENT) 75 MG/ML SOAJ Inject 1 pen into the skin every 14 (fourteen) days. 09/12/19   Evans Lance, MD  allopurinol (ZYLOPRIM) 100 MG tablet Take 50 mg by mouth daily.    [provider]  aspirin EC 81 MG tablet Take 81 mg by mouth every evening.    [provider]  clopidogrel (PLAVIX) 75 MG tablet Take 75 mg by mouth every evening.     [provider]  cyanocobalamin (,VITAMIN B-12,) 1000 MCG/ML injection Inject 1,000 mcg into the muscle every 14 (fourteen) days.    [provider]  cyclobenzaprine (FLEXERIL) 10 MG tablet Take 10 mg by mouth 3 (three) times daily as needed for muscle spasms.    [provider]  doxazosin (CARDURA) 1 MG tablet Take 1 tablet (1 mg total) by mouth every 12 (twelve) hours as needed (for systolic BP > 0000000 mm Hg.). 10/10/18   Bensimhon, Shaune Pascal, MD  fluticasone (FLONASE) 50 MCG/ACT nasal spray Place 2 sprays into both nostrils daily as needed for allergies or rhinitis.     [provider]  ipratropium-albuterol (DUONEB) 0.5-2.5 (3) MG/3ML SOLN Take 3 mLs by nebulization every 6 (six) hours as needed (wheezing, sob).    [provider]  metolazone (ZAROXOLYN) 2.5 MG tablet Take 1 tablet (2.5 mg total) by mouth once a week. Every Monday and as needed 03/22/20   Bensimhon, Shaune Pascal, MD  MITIGARE 0.6 MG CAPS Take 2 capsules by mouth two times daily    [provider]  nitroGLYCERIN (NITROSTAT) 0.4 MG SL tablet Place 0.4 mg under the tongue every 5 (five) minutes as needed for chest pain.    [provider]  Oxycodone HCl 20 MG TABS Take 20 mg by mouth every 6 (six) hours as needed.  12/22/19   [provider]  potassium chloride SA (KLOR-CON) 20 MEQ tablet Take 40 mEq by mouth 2 (two) times  daily. Extra tab when you take metolazone    [provider]  sacubitril-valsartan (ENTRESTO) 49-51 MG Take 1 tablet by mouth 2 (two) times daily. 04/29/20   Bensimhon, Shaune Pascal, MD  sildenafil (VIAGRA) 100 MG  tablet Take 50 mg by mouth daily as needed for erectile dysfunction.    [provider]  torsemide (DEMADEX) 20 MG tablet Take 2 tablets (40 mg total) by mouth 2 (two) times daily. 04/29/20   Bensimhon, Shaune Pascal, MD    Allergies    Contrast media [iodinated diagnostic agents], Iohexol, Lipitor [atorvastatin calcium], Shellfish allergy, Sulfa antibiotics, Sulfonamide derivatives, Almond oil, Metrizamide, Zocor [simvastatin], Food, Latex, and Peach [prunus persica]  Review of Systems   Review of Systems  Constitutional: Negative for diaphoresis and fever.  Eyes: Negative for redness.  Respiratory: Positive for shortness of breath. Negative for cough.   Cardiovascular: Positive for chest pain. Negative for palpitations and leg swelling.  Gastrointestinal: Negative for abdominal pain, nausea and vomiting.  Genitourinary: Negative for dysuria.  Musculoskeletal: Negative for back pain and neck pain.  Skin: Negative for rash.  Neurological: Negative for syncope and light-headedness.  Psychiatric/Behavioral: The patient is not nervous/anxious.     Physical Exam Updated Vital Signs BP 98/75   Pulse 83   Temp 98.5 F (36.9 C) (Oral)   Resp (!) 22   Ht 5\' 11"  (1.803 m)   Wt 70.8 kg   SpO2 99%   BMI 21.76 kg/m   Physical Exam Vitals and nursing note reviewed.  Constitutional:      Appearance: He is well-developed. He is not diaphoretic.  HENT:     Head: Normocephalic and atraumatic.     Mouth/Throat:     Mouth: Mucous membranes are not dry.  Eyes:     General:        Right eye: No discharge.        Left eye: No discharge.     Conjunctiva/sclera: Conjunctivae normal.  Neck:     Vascular: Normal carotid pulses. No carotid bruit or JVD.     Trachea: Trachea  normal. No tracheal deviation.  Cardiovascular:     Rate and Rhythm: Normal rate and regular rhythm.     Pulses: No decreased pulses.     Heart sounds: S1 normal and S2 normal. Heart sounds not distant. Murmur present. Systolic murmur present.  Pulmonary:     Effort: Pulmonary effort is normal. No respiratory distress.     Breath sounds: Examination of the right-lower field reveals rales. Examination of the left-lower field reveals rales. Rales present. No wheezing.  Chest:     Chest wall: No tenderness.     Comments: Healed sternotomy scar.  Abdominal:     General: Bowel sounds are normal.     Palpations: Abdomen is soft.     Tenderness: There is no abdominal tenderness. There is no guarding or rebound.  Musculoskeletal:     Cervical back: Normal range of motion and neck supple. No muscular tenderness.     Right lower leg: No tenderness. No edema.     Left lower leg: No tenderness. No edema.  Skin:    General: Skin is warm and dry.     Coloration: Skin is not pale.  Neurological:     Mental Status: He is alert.     ED Results / Procedures / Treatments   Labs (all labs ordered are listed, but only abnormal results are displayed) Labs Reviewed  CBC - Abnormal; Notable for the following components:      Result Value   WBC 3.3 (*)    RDW 15.8 (*)    Platelets 96 (*)    All other components within normal limits  COMPREHENSIVE METABOLIC PANEL - Abnormal;  Notable for the following components:   Glucose, Bld 104 (*)    BUN 62 (*)    Creatinine, Ser 2.59 (*)    Calcium 8.3 (*)    Total Bilirubin 1.3 (*)    GFR calc non Af Amer 26 (*)    GFR calc Af Amer 31 (*)    All other components within normal limits  BRAIN NATRIURETIC PEPTIDE - Abnormal; Notable for the following components:   B Natriuretic Peptide 3,460.9 (*)    All other components within normal limits  TROPONIN I (HIGH SENSITIVITY) - Abnormal; Notable for the following components:   Troponin I (High Sensitivity) 21  (*)    All other components within normal limits  RESPIRATORY PANEL BY RT PCR (FLU A&B, COVID)  TROPONIN I (HIGH SENSITIVITY)    EKG None  Radiology DG Chest Portable 1 View  Result Date: 04/30/2020 CLINICAL DATA:  Shortness of breath EXAM: PORTABLE CHEST 1 VIEW COMPARISON:  December 16, 2019 FINDINGS: Again noted is aneurysmal dilatation of the thoracic aorta. This appears relatively stable since prior study. There is a single lead left-sided ICD in place. There is significant cardiomegaly. The patient is status post prior median sternotomy. There is vascular congestion with possible early developing pulmonary edema. There is no pneumothorax. No large pleural effusion. IMPRESSION: 1. Cardiomegaly with volume overload and possible early developing pulmonary edema. 2. 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 Electronically Signed   By: Constance Holster M.D.   On: 04/30/2020 19:56    Procedures Procedures (including critical care time)  Medications Ordered in ED Medications  aspirin chewable tablet 324 mg (324 mg Oral Given 04/30/20 1952)    ED Course  I have reviewed the triage vital signs and the nursing notes.  Pertinent labs & imaging results that were available during my care of the patient were reviewed by me and considered in my medical decision making (see chart for details).  Patient seen and examined. Reviewed cardiology note from yesterday. Will repeat labs given CP today. Reviewed EKG. Will need cards eval and admission.   Vital signs reviewed and are as follows: BP 98/75   Pulse 83   Temp 98.5 F (36.9 C) (Oral)   Resp (!) 22   Ht 5\' 11"  (1.803 m)   Wt 70.8 kg   SpO2 99%   BMI 21.76 kg/m   BNP 2500 > 3400 over the past 2 days. Also creatinine 2.28 >> 2.59 (baseline upper 1's)   Will consult cardiology for admission.     9:00 PM Spoke with Dr. Emilio Aspen of cardiology who will see. Pt updated and aware of plan. Agrees hold diuretic at this  point.     MDM Rules/Calculators/A&P                      Admit for worsening heart failure in complex patient.     Final Clinical Impression(s) / ED Diagnoses Final diagnoses:  Acute on chronic systolic heart failure (HCC)  Acute renal failure superimposed on chronic kidney disease, unspecified CKD stage, unspecified acute renal failure type [facility])    Rx / DC Orders ED Discharge Orders    None       Carlisle Cater, PA-C 04/30/20 2144    Maudie Flakes, MD 05/06/20 [phone]

## 2020-05-01 ENCOUNTER — Inpatient Hospital Stay (HOSPITAL_COMMUNITY): Payer: Medicaid Other

## 2020-05-01 DIAGNOSIS — R570 Cardiogenic shock: Secondary | ICD-10-CM

## 2020-05-01 DIAGNOSIS — I340 Nonrheumatic mitral (valve) insufficiency: Secondary | ICD-10-CM | POA: Diagnosis not present

## 2020-05-01 DIAGNOSIS — I351 Nonrheumatic aortic (valve) insufficiency: Secondary | ICD-10-CM

## 2020-05-01 DIAGNOSIS — I5023 Acute on chronic systolic (congestive) heart failure: Secondary | ICD-10-CM

## 2020-05-01 LAB — COOXEMETRY PANEL
Carboxyhemoglobin: 1.1 % (ref 0.5–1.5)
Methemoglobin: 0.6 % (ref 0.0–1.5)
O2 Saturation: 65.6 %
Total hemoglobin: 10.9 g/dL — ABNORMAL LOW (ref 12.0–16.0)

## 2020-05-01 LAB — CBC
HCT: 41.2 % (ref 39.0–52.0)
Hemoglobin: 13.4 g/dL (ref 13.0–17.0)
MCH: 27.2 pg (ref 26.0–34.0)
MCHC: 32.5 g/dL (ref 30.0–36.0)
MCV: 83.7 fL (ref 80.0–100.0)
Platelets: 97 10*3/uL — ABNORMAL LOW (ref 150–400)
RBC: 4.92 MIL/uL (ref 4.22–5.81)
RDW: 15.7 % — ABNORMAL HIGH (ref 11.5–15.5)
WBC: 3.6 10*3/uL — ABNORMAL LOW (ref 4.0–10.5)
nRBC: 0 % (ref 0.0–0.2)

## 2020-05-01 LAB — IRON AND TIBC
Iron: 74 ug/dL (ref 45–182)
Saturation Ratios: 21 % (ref 17.9–39.5)
TIBC: 354 ug/dL (ref 250–450)
UIBC: 280 ug/dL

## 2020-05-01 LAB — BASIC METABOLIC PANEL
Anion gap: 12 (ref 5–15)
BUN: 62 mg/dL — ABNORMAL HIGH (ref 6–20)
CO2: 27 mmol/L (ref 22–32)
Calcium: 8.3 mg/dL — ABNORMAL LOW (ref 8.9–10.3)
Chloride: 102 mmol/L (ref 98–111)
Creatinine, Ser: 2.39 mg/dL — ABNORMAL HIGH (ref 0.61–1.24)
GFR calc Af Amer: 34 mL/min — ABNORMAL LOW (ref >60)
GFR calc non Af Amer: 29 mL/min — ABNORMAL LOW (ref >60)
Glucose, Bld: 100 mg/dL — ABNORMAL HIGH (ref 70–99)
Potassium: 4.1 mmol/L (ref 3.5–5.1)
Sodium: 141 mmol/L (ref 135–145)

## 2020-05-01 LAB — HIV ANTIBODY (ROUTINE TESTING W REFLEX): HIV Screen 4th Generation wRfx: NONREACTIVE

## 2020-05-01 LAB — MAGNESIUM: Magnesium: 2 mg/dL (ref 1.7–2.4)

## 2020-05-01 LAB — FERRITIN: Ferritin: 57 ng/mL (ref 24–336)

## 2020-05-01 LAB — ECHOCARDIOGRAM COMPLETE
Height: 71 in
Weight: 2494.4 oz

## 2020-05-01 LAB — MRSA PCR SCREENING: MRSA by PCR: NEGATIVE

## 2020-05-01 MED ORDER — MILRINONE LACTATE IN DEXTROSE 20-5 MG/100ML-% IV SOLN
0.3750 ug/kg/min | INTRAVENOUS | Status: DC
  Administered 2020-05-01 – 2020-05-02 (×3): 0.25 ug/kg/min via INTRAVENOUS
  Administered 2020-05-03: 0.375 ug/kg/min via INTRAVENOUS
  Filled 2020-05-01 (×3): qty 100

## 2020-05-01 MED ORDER — MIDAZOLAM HCL 2 MG/2ML IJ SOLN
1.0000 mg | Freq: Once | INTRAMUSCULAR | Status: AC
  Administered 2020-05-01: 12:00:00 1 mg via INTRAVENOUS

## 2020-05-01 MED ORDER — LORAZEPAM 1 MG PO TABS
1.0000 mg | ORAL_TABLET | Freq: Once | ORAL | Status: AC
  Administered 2020-05-01: 05:00:00 1 mg via ORAL
  Filled 2020-05-01: qty 1

## 2020-05-01 MED ORDER — CHLORHEXIDINE GLUCONATE CLOTH 2 % EX PADS
6.0000 | MEDICATED_PAD | Freq: Every day | CUTANEOUS | Status: DC
  Administered 2020-05-02: 13:00:00 6 via TOPICAL

## 2020-05-01 MED ORDER — CHLORHEXIDINE GLUCONATE CLOTH 2 % EX PADS
6.0000 | MEDICATED_PAD | Freq: Every day | CUTANEOUS | Status: DC
  Administered 2020-05-01 – 2020-05-03 (×2): 6 via TOPICAL

## 2020-05-01 MED ORDER — FENTANYL CITRATE (PF) 100 MCG/2ML IJ SOLN
INTRAMUSCULAR | Status: AC
  Filled 2020-05-01: qty 2

## 2020-05-01 MED ORDER — MIDAZOLAM HCL 2 MG/2ML IJ SOLN
INTRAMUSCULAR | Status: AC
  Filled 2020-05-01: qty 2

## 2020-05-01 MED ORDER — FENTANYL CITRATE (PF) 100 MCG/2ML IJ SOLN
25.0000 ug | Freq: Once | INTRAMUSCULAR | Status: AC
  Administered 2020-05-01: 12:00:00 25 ug via INTRAVENOUS

## 2020-05-01 NOTE — CV Procedure (Signed)
2D echo attempted, xray in room. Will try later

## 2020-05-01 NOTE — Plan of Care (Signed)
  Problem: Education: Goal: Knowledge of General Education information will improve Description Including pain rating scale, medication(s)/side effects and non-pharmacologic comfort measures Outcome: Progressing   

## 2020-05-01 NOTE — CV Procedure (Signed)
   Pulmonary Artery Catheter Insertion Procedure Note Chad [name]. [id number] November 05, 1961    Procedure: Insertion of Pulmonary Artery Catheter Indications: Shock - Hemodynamic Monitoring   Procedure Details Consent: Emergent consent due to patient condition. Time Out: Verified patient identification, verified procedure, site/side was marked, verified correct patient position, special equipment/implants available, medications/allergies/relevent history reviewed, required imaging and test results available.  Performed   The right neck was prepped and draped in the routine sterile fashion and anesthetized with 1% local lidocaine. An 8 FR venous sheath was placed in the right internal jugular vein using a modified Seldinger technique and u/s guidance. The right IJ was quite decompressed on u/s consistent with our suspicion of likely volume depletion. However we were able to cannulate with only mild difficulty. A standard Swan-Ganz catheter was used for the procedure. The distal tip of the PA cath was maneuvered into the right pulmonary artery using pressure waveform guidance and the sheath was sutured in place.     Evaluation Blood flow good Complications: No apparent complications Patient did tolerate procedure well. Chest X-ray ordered to verify placement.  CXR: pending   Glori Bickers, MD  11:33 AM

## 2020-05-01 NOTE — Progress Notes (Signed)
   Echocardiogram 2D Echocardiogram has been performed.  Chad [name] 05/01/2020, 1:40 PM

## 2020-05-01 NOTE — Progress Notes (Signed)
  CXR ok.    Luiz Blare #s done personally with RN at bedside  CVP 9 (v-waves to 15) PA 47/31 (39) PCWP 21 (v-waves to 35) Thermo 3.4/1.78 SVR 1860 PVR 4.5 WU PAPi  1.8   Start milrinone  Glori Bickers, MD  12:40 PM

## 2020-05-01 NOTE — Progress Notes (Signed)
Advanced Heart Failure Rounding Note   Subjective:    Admitted overnight for low output symptoms with profound fatigue and worsening dyspnea.   This am remains fatigued. Reports wheezing and orthopnea. No CP.   Creatinine rising. Lactate normal.     Objective:   Weight Range:  Vital Signs:   Temp:  [97.2 F (36.2 C)-98.5 F (36.9 C)] 97.8 F (36.6 C) (05/08 0815) Pulse Rate:  [48-91] 78 (05/08 0815) Resp:  [16-30] 18 (05/08 0815) BP: (90-108)/(61-87) 108/74 (05/08 0815) SpO2:  [95 %-100 %] 97 % (05/08 0815) Weight:  [70.7 kg-70.8 kg] 70.7 kg (05/07 2318)    Weight change: Filed Weights   04/30/20 1851 04/30/20 2318  Weight: 70.8 kg 70.7 kg    Intake/Output:   Intake/Output Summary (Last 24 hours) at 05/01/2020 0914 Last data filed at 05/01/2020 0600 Gross per 24 hour  Intake --  Output 375 ml  Net -375 ml     Physical Exam: General:  Weak appearing. No resp difficulty HEENT: normal Neck: supple. JVP to jaw . Carotids 2+ bilat; no bruits. No lymphadenopathy or thryomegaly appreciated. Cor: PMI nondisplaced. Regular rate & rhythm. 3/6 MR + s3 Lungs: clear Abdomen: soft, nontender, nondistended. No hepatosplenomegaly. No bruits or masses. Good bowel sounds. Extremities: no cyanosis, clubbing, rash, edema. cool Neuro: alert & orientedx3, cranial nerves grossly intact. moves all 4 extremities w/o difficulty. Affect pleasant  Telemetry: NSR 70-80s Personally reviewed  Labs: Basic Metabolic Panel: Recent Labs  Lab 04/28/20 1008 04/30/20 1937 05/01/20 0410  NA 138 138 141  K 3.3* 4.4 4.1  CL 96 98 102  CO2 32* 25 27  GLUCOSE 108* 104* 100*  BUN 60* 62* 62*  CREATININE 2.28* 2.59* 2.39*  CALCIUM 8.5* 8.3* 8.3*  MG 1.9  --  2.0    Liver Function Tests: Recent Labs  Lab 04/28/20 1008 04/30/20 1937  AST 20 40  ALT 22 33  ALKPHOS 92 83  BILITOT 0.8 1.3*  PROT 7.1 6.8  ALBUMIN 4.5 4.0   No results for input(s): LIPASE, AMYLASE in the last 168  hours. No results for input(s): AMMONIA in the last 168 hours.  CBC: Recent Labs  Lab 04/28/20 1008 04/30/20 1937 05/01/20 0410  WBC 3.4 3.3* 3.6*  HGB 14.1 13.8 13.4  HCT 41.7 43.1 41.2  MCV 80 85.0 83.7  PLT 111* 96* 97*    Cardiac Enzymes: No results for input(s): CKTOTAL, CKMB, CKMBINDEX, TROPONINI in the last 168 hours.  BNP: BNP (last 3 results) Recent Labs    03/22/20 1348 04/28/20 1008 04/30/20 1937  BNP 1,710.7* 2,504.3* 3,460.9*    ProBNP (last 3 results) No results for input(s): PROBNP in the last 8760 hours.    Other results:  Imaging: DG Chest Portable 1 View  Result Date: 04/30/2020 CLINICAL DATA:  Shortness of breath EXAM: PORTABLE CHEST 1 VIEW COMPARISON:  December 16, 2019 FINDINGS: Again noted is aneurysmal dilatation of the thoracic aorta. This appears relatively stable since prior study. There is a single lead left-sided ICD in place. There is significant cardiomegaly. The patient is status post prior median sternotomy. There is vascular congestion with possible early developing pulmonary edema. There is no pneumothorax. No large pleural effusion. IMPRESSION: 1. Cardiomegaly with volume overload and possible early developing pulmonary edema. 2. 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 Electronically Signed   By: Constance Holster M.D.   On: 04/30/2020 19:56      Medications:  Scheduled Medications: . aspirin EC  81 mg Oral QPM  . clopidogrel  75 mg Oral QPM  . heparin  5,000 Units Subcutaneous Q8H  . sodium chloride flush  3 mL Intravenous Q12H     Infusions: . sodium chloride       PRN Medications:  sodium chloride, acetaminophen, cyclobenzaprine, fluticasone, ipratropium-albuterol, ondansetron (ZOFRAN) IV, oxyCODONE, sodium chloride flush   Assessment/Plan:   1. Acute on Chronic systolic HF -> cardiogenic shock - long-standing ischemic cardiomyopathy.  - ECHO  12/31/19 showed EF 15- 20% RV moderately down,  severe MR - CPX 01/05/20 with pVO2: 15.8 (48% predicted peak VO2), slope 34 RER 0.96 VE/VCO2 slope: 34  - He has seen w Dr. Prescott Gum in the past and given previous dissection and aortic root replacement he is not a candidate for VAD here. - He has been seen by [facility] and [facility] - Dr. Wannetta Sender. Would consider high-risk VAD when ready. If not canddiate could consider MitraClip as I think MR is significantly contributing to symptoms - though he is more like [place] patient then [place] to deteriorate with NYHA IV symptoms with probable low output in setting of severe LV dysfunction and severe MR. Overall fluid status doe not appear too bad but suspect pulmonary pressure high with MR - Move to ICU. Place swan. Will almost certainly need inotrope support   2. CAD s/p CABG 2006:  - Cardiolite 11/2015 with evidence for mild reversibility involving the anterolateral wall and a portion of the lateral wall. 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  Hold Plavix in case he qualifies for advanced therapies - Statin intolerance at any dose. Also unable to tolerate Zetia - [facility] been [facility] for possible PCSK-9   3. 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 H/o Type I aortic dissection: Follows with CVTS.  - He has seen w Dr. Prescott Gum. Given previous dissection and aortic root replacement likely not candidate for VAD. - Echo 04/18/18: aortic root, ascending aorta, and aortic arch all mildly dilated - ECHO 12/2019: Ao Root diam: 5.10 cm, Ao Asc diam: 4.30 cm  5. Severe MR  - Severe on ECHO and exam - See MitraClip discussion above   CRITICAL CARE Performed by: Glori Bickers  Total critical care time: 45 minutes  Critical care time was exclusive of  separately billable procedures and treating other patients.  Critical care was necessary to treat or prevent imminent or life-threatening deterioration.  Critical care was time spent personally by me (independent of midlevel providers or residents) on the following activities: development of treatment plan with patient and/or surrogate as well as nursing, discussions with consultants, evaluation of patient's response to treatment, examination of patient, obtaining history from patient or surrogate, ordering and performing treatments and interventions, ordering and review of laboratory studies, ordering and review of radiographic studies, pulse oximetry and re-evaluation of patient's condition.    Length of Stay: 1   Glori Bickers 05/01/2020, 9:14 AM  Advanced Heart Failure Team Pager [phone] (M-F; 7a - 4p)  Please contact [facility] for night-coverage after hours (4p -7a ) and weekends on [email or web address]

## 2020-05-02 LAB — BASIC METABOLIC PANEL
Anion gap: 6 (ref 5–15)
Anion gap: 8 (ref 5–15)
BUN: 55 mg/dL — ABNORMAL HIGH (ref 6–20)
BUN: 41 mg/dL — ABNORMAL HIGH (ref 6–20)
CO2: 26 mmol/L (ref 22–32)
CO2: 24 mmol/L (ref 22–32)
Calcium: 7 mg/dL — ABNORMAL LOW (ref 8.9–10.3)
Calcium: 7.9 mg/dL — ABNORMAL LOW (ref 8.9–10.3)
Chloride: 108 mmol/L (ref 98–111)
Chloride: 105 mmol/L (ref 98–111)
Creatinine, Ser: 1.97 mg/dL — ABNORMAL HIGH (ref 0.61–1.24)
Creatinine, Ser: 1.67 mg/dL — ABNORMAL HIGH (ref 0.61–1.24)
GFR calc Af Amer: 42 mL/min — ABNORMAL LOW (ref >60)
GFR calc Af Amer: 52 mL/min — ABNORMAL LOW (ref >60)
GFR calc non Af Amer: 37 mL/min — ABNORMAL LOW (ref >60)
GFR calc non Af Amer: 45 mL/min — ABNORMAL LOW (ref >60)
Glucose, Bld: 105 mg/dL — ABNORMAL HIGH (ref 70–99)
Glucose, Bld: 108 mg/dL — ABNORMAL HIGH (ref 70–99)
Potassium: 3.4 mmol/L — ABNORMAL LOW (ref 3.5–5.1)
Potassium: 4.7 mmol/L (ref 3.5–5.1)
Sodium: 140 mmol/L (ref 135–145)
Sodium: 137 mmol/L (ref 135–145)

## 2020-05-02 LAB — CBC
HCT: 36.3 % — ABNORMAL LOW (ref 39.0–52.0)
Hemoglobin: 11.7 g/dL — ABNORMAL LOW (ref 13.0–17.0)
MCH: 26.9 pg (ref 26.0–34.0)
MCHC: 32.2 g/dL (ref 30.0–36.0)
MCV: 83.4 fL (ref 80.0–100.0)
Platelets: 85 10*3/uL — ABNORMAL LOW (ref 150–400)
RBC: 4.35 MIL/uL (ref 4.22–5.81)
RDW: 15.4 % (ref 11.5–15.5)
WBC: 4.1 10*3/uL (ref 4.0–10.5)
nRBC: 0 % (ref 0.0–0.2)

## 2020-05-02 LAB — MAGNESIUM: Magnesium: 1.6 mg/dL — ABNORMAL LOW (ref 1.7–2.4)

## 2020-05-02 LAB — COOXEMETRY PANEL
Carboxyhemoglobin: 1 % (ref 0.5–1.5)
Methemoglobin: 0.6 % (ref 0.0–1.5)
O2 Saturation: 66.4 %
Total hemoglobin: 11.4 g/dL — ABNORMAL LOW (ref 12.0–16.0)

## 2020-05-02 MED ORDER — HYDRALAZINE HCL 20 MG/ML IJ SOLN
10.0000 mg | Freq: Once | INTRAMUSCULAR | Status: DC
  Filled 2020-05-02: qty 1

## 2020-05-02 MED ORDER — POTASSIUM CHLORIDE CRYS ER 20 MEQ PO TBCR
40.0000 meq | EXTENDED_RELEASE_TABLET | Freq: Once | ORAL | Status: AC
  Administered 2020-05-02: 13:00:00 40 meq via ORAL
  Filled 2020-05-02: qty 2

## 2020-05-02 MED ORDER — MAGNESIUM SULFATE 4 GM/100ML IV SOLN
4.0000 g | Freq: Once | INTRAVENOUS | Status: AC
  Administered 2020-05-02: 13:00:00 4 g via INTRAVENOUS
  Filled 2020-05-02: qty 100

## 2020-05-02 MED ORDER — ALUM & MAG HYDROXIDE-SIMETH 200-200-20 MG/5ML PO SUSP
15.0000 mL | Freq: Four times a day (QID) | ORAL | Status: DC | PRN
  Administered 2020-05-02: 12:00:00 15 mL via ORAL
  Filled 2020-05-02: qty 30

## 2020-05-02 NOTE — Plan of Care (Signed)
Pt is alert and oriented, on 3L [place], breathing is even and unlabored. Pt has complained of lower back pain and feet pain a couple times at beginning of shift, PRN medication given. See MAR. PT was educated on medication schedule. Pt is able to move all extremities but has generalized weakness. Pt has denied shortness of breath. Cardiologist MD on call was paged 3 times, awaiting call back for potassium and magnesium replacement based on labs this morning. Frequent PVCs noted on monitor. Pt voices no concerns or complaints voiced at this time.  Problem: Education Goal: Knowledge of General Education information will improve Description: Including pain rating scale, medication(s)/side effects and non-pharmacologic comfort measures Outcome: Progressing   Problem: Clinical Measurements: Goal: Respiratory complications will improve Outcome: Progressing Goal: Cardiovascular complication will be avoided Outcome: Progressing   Problem: Pain Managment: Goal: General experience of comfort will improve Outcome: Progressing   Problem: Safety: Goal: Ability to remain free from injury will improve Outcome: Progressing   Problem: Activity: Goal: Capacity to carry out activities will improve Outcome: Progressing   Problem: Cardiac: Goal: Ability to achieve and maintain adequate cardiopulmonary perfusion will improve Outcome: Progressing

## 2020-05-02 NOTE — Progress Notes (Addendum)
Advanced Heart Failure Rounding Note   Subjective:    Admitted 5/7 for low output symptoms with profound fatigue and worsening dyspnea.   Feels much better on milrinone 0.25. Co-ox 66% Creatinine improved 2.4 -> 1.9  Denies SOB, orthopnea or PND.   Swan#s CVP 8-9 PA 50/36 Thermo 3.3/1.7 MV sat 66%   Objective:   Weight Range:  Vital Signs:   Temp:  [98.1 F (36.7 C)-99.5 F (37.5 C)] 98.6 F (37 C) (05/09 1000) Pulse Rate:  [49-100] 84 (05/09 1000) Resp:  [12-29] 28 (05/09 1000) BP: (80-114)/(54-93) 108/73 (05/09 0900) SpO2:  [93 %-100 %] 100 % (05/09 1000) Weight:  [73 kg] 73 kg (05/09 0347) Last BM Date: 04/30/20  Weight change: Filed Weights   04/30/20 1851 04/30/20 2318 05/02/20 0347  Weight: 70.8 kg 70.7 kg 73 kg    Intake/Output:   Intake/Output Summary (Last 24 hours) at 05/02/2020 1219 Last data filed at 05/02/2020 1000 Gross per 24 hour  Intake 538.18 ml  Output 1700 ml  Net -1161.82 ml     Physical Exam: General:  Weak appearing. No resp difficulty HEENT: normal Neck: supple. RIJ swan CVP 8-9 + prominent V waves . Carotids 2+ bilat; no bruits. No lymphadenopathy or thryomegaly appreciated. Cor: PMI nondisplaced. Regular rate & rhythm. +s3 3/6 MR Lungs: clear Abdomen: soft, nontender, nondistended. No hepatosplenomegaly. No bruits or masses. Good bowel sounds. Extremities: no cyanosis, clubbing, rash, edema Neuro: alert & orientedx3, cranial nerves grossly intact. moves all 4 extremities w/o difficulty. Affect pleasant   Telemetry: NSR 80s Personally reviewed  Labs: Basic Metabolic Panel: Recent Labs  Lab 04/28/20 1008 04/28/20 1008 04/30/20 1937 05/01/20 0410 05/02/20 0322  NA 138  --  138 141 140  K 3.3*  --  4.4 4.1 3.4*  CL 96  --  98 102 108  CO2 32*  --  25 27 26   GLUCOSE 108*  --  104* 100* 105*  BUN 60*  --  62* 62* 55*  CREATININE 2.28*  --  2.59* 2.39* 1.97*  CALCIUM 8.5*   < > 8.3* 8.3* 7.0*  MG 1.9  --   --  2.0  1.6*   < > = values in this interval not displayed.    Liver Function Tests: Recent Labs  Lab 04/28/20 1008 04/30/20 1937  AST 20 40  ALT 22 33  ALKPHOS 92 83  BILITOT 0.8 1.3*  PROT 7.1 6.8  ALBUMIN 4.5 4.0   No results for input(s): LIPASE, AMYLASE in the last 168 hours. No results for input(s): AMMONIA in the last 168 hours.  CBC: Recent Labs  Lab 04/28/20 1008 04/30/20 1937 05/01/20 0410 05/02/20 0322  WBC 3.4 3.3* 3.6* 4.1  HGB 14.1 13.8 13.4 11.7*  HCT 41.7 43.1 41.2 36.3*  MCV 80 85.0 83.7 83.4  PLT 111* 96* 97* 85*    Cardiac Enzymes: No results for input(s): CKTOTAL, CKMB, CKMBINDEX, TROPONINI in the last 168 hours.  BNP: BNP (last 3 results) Recent Labs    03/22/20 1348 04/28/20 1008 04/30/20 1937  BNP 1,710.7* 2,504.3* 3,460.9*    ProBNP (last 3 results) No results for input(s): PROBNP in the last 8760 hours.    Other results:  Imaging: DG Chest 1 View  Result Date: 05/01/2020 CLINICAL DATA:  Central catheter placement EXAM: CHEST  1 VIEW COMPARISON:  Apr 30, 2020 FINDINGS: Swan-Ganz catheter tip is in the distal right lower lobe pulmonary artery. No pneumothorax. There is new airspace opacity in  the right mid lung. Atelectatic change noted in left base. Lungs elsewhere clear. There is cardiomegaly. There is prominence of the aorta with apparent aneurysmal dilatation, stable. Pacemaker lead attached to right ventricle. Status post coronary artery bypass grafting. Surgical clips in the left cervical-thoracic junction region again noted. IMPRESSION: Swan-Ganz catheter tip is in the distal right pulmonary artery. Advise withdrawing this catheter approximately 6 cm. New ill-defined airspace opacity right mid lung consistent with developing pneumonia. Persistent left lower lobe atelectatic change. These results will be called to the ordering clinician or representative by the Radiologist Assistant, and communication documented in the PACS or [employer]. Electronically Signed   By: Lowella Grip III M.D.   On: 05/01/2020 11:46   DG Chest Portable 1 View  Result Date: 04/30/2020 CLINICAL DATA:  Shortness of breath EXAM: PORTABLE CHEST 1 VIEW COMPARISON:  December 16, 2019 FINDINGS: Again noted is aneurysmal dilatation of the thoracic aorta. This appears relatively stable since prior study. There is a single lead left-sided ICD in place. There is significant cardiomegaly. The patient is status post prior median sternotomy. There is vascular congestion with possible early developing pulmonary edema. There is no pneumothorax. No large pleural effusion. IMPRESSION: 1. Cardiomegaly with volume overload and possible early developing pulmonary edema. 2. 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 Electronically Signed   By: Constance Holster M.D.   On: 04/30/2020 19:56   ECHOCARDIOGRAM COMPLETE  Result Date: 05/01/2020    ECHOCARDIOGRAM REPORT   Patient Name:   Chad [name]. Date of Exam: 05/01/2020 Medical Rec #:  [id number]            Height:       71.0 in Accession #:    [id number]           Weight:       155.9 lb Date of Birth:  07-14-1961             BSA:          1.897 m Patient Age:    59 years             BP:           89/68 mmHg Patient Gender: M                    HR:           84 bpm. Exam Location:  Inpatient Procedure: 2D Echo, Cardiac Doppler and Color Doppler Indications:    CHF  History:        Patient has prior history of Echocardiogram examinations, most                 recent 12/31/2019. CHF and Cardiomyopathy, CAD and Previous                 Myocardial Infarction, Prior CABG and Defibrillator, Aortic                 Dissection, Mitral Valve Disease and Aortic Valve Disease,                 Signs/Symptoms:Shortness of Breath and Chest Pain; Risk                 Factors:Hypertension.  Sonographer:    Dustin Flock Referring Phys: [id number] Nora  1. Left ventricular ejection  fraction, by estimation, is <20%. The left ventricle has severely decreased function. The left ventricle demonstrates global hypokinesis. The left  ventricular internal cavity size was severely dilated. There is mild left ventricular hypertrophy. Left ventricular diastolic parameters are consistent with Grade III diastolic dysfunction (restrictive). Elevated left ventricular end-diastolic pressure.  2. Right ventricular systolic function is mildly reduced. The right ventricular size is moderately enlarged. A AICD wire is visualized. There is moderately elevated pulmonary artery systolic pressure. The estimated right ventricular systolic pressure is  0000000 mmHg.  3. Left atrial size was severely dilated.  4. Right atrial size was severely dilated.  5. The mitral valve is abnormal. Moderate to severe mitral valve regurgitation.  6. Tricuspid valve regurgitation is mild to moderate.  7. The aortic valve is tricuspid. Aortic valve regurgitation is mild to moderate.  8. The inferior vena cava is dilated in size with <50% respiratory variability, suggesting right atrial pressure of 15 mmHg. Comparison(s): Changes from prior study are noted. 12/30/2019: LVEF 15-20%, grade III DD. FINDINGS  Left Ventricle: Left ventricular ejection fraction, by estimation, is <20%. The left ventricle has severely decreased function. The left ventricle demonstrates global hypokinesis. The left ventricular internal cavity size was severely dilated. There is mild left ventricular hypertrophy. Left ventricular diastolic parameters are consistent with Grade III diastolic dysfunction (restrictive). Elevated left ventricular end-diastolic pressure. Right Ventricle: The right ventricular size is moderately enlarged. No increase in right ventricular wall thickness. Right ventricular systolic function is mildly reduced. There is moderately elevated pulmonary artery systolic pressure. The tricuspid regurgitant velocity is 3.31 m/s, and with an assumed  right atrial pressure of 15 mmHg, the estimated right ventricular systolic pressure is 0000000 mmHg. Left Atrium: Left atrial size was severely dilated. Right Atrium: Right atrial size was severely dilated. Pericardium: There is no evidence of pericardial effusion. Mitral Valve: The mitral valve is abnormal. There is mild thickening of the mitral valve leaflet(s). Moderate to severe mitral valve regurgitation, with centrally-directed jet. Tricuspid Valve: The tricuspid valve is grossly normal. Tricuspid valve regurgitation is mild to moderate. Aortic Valve: The aortic valve is tricuspid. Aortic valve regurgitation is mild to moderate. Aortic regurgitation PHT measures 299 msec. Pulmonic Valve: The pulmonic valve was grossly normal. Pulmonic valve regurgitation is not visualized. Aorta: The aortic root is normal in size and structure. Venous: The inferior vena cava is dilated in size with less than 50% respiratory variability, suggesting right atrial pressure of 15 mmHg. IAS/Shunts: No atrial level shunt detected by color flow Doppler. Additional Comments: A AICD wire is visualized.  LEFT VENTRICLE PLAX 2D LVIDd:         7.11 cm  Diastology LVIDs:         6.56 cm  LV e' lateral:   5.81 cm/s LV PW:         1.13 cm  LV E/e' lateral: 19.6 LV IVS:        1.03 cm  LV e' medial:    2.81 cm/s LVOT diam:     2.80 cm  LV E/e' medial:  40.6 LV SV:         81 LV SV Index:   43 LVOT Area:     6.16 cm  RIGHT VENTRICLE RV Basal diam:  4.10 cm RV S prime:     7.14 cm/s TAPSE (M-mode): 2.0 cm LEFT ATRIUM              Index       RIGHT ATRIUM           Index LA diam:        5.80 cm  3.06  cm/m  RA Area:     30.10 cm LA Vol (A2C):   134.0 ml 70.65 ml/m RA Volume:   113.00 ml 59.58 ml/m LA Vol (A4C):   123.0 ml 64.85 ml/m LA Biplane Vol: 133.0 ml 70.12 ml/m  AORTIC VALVE LVOT Vmax:   69.20 cm/s LVOT Vmean:  39.200 cm/s LVOT VTI:    0.132 m AI PHT:      299 msec  AORTA Ao Root diam: 4.60 cm MITRAL VALVE                TRICUSPID VALVE  MV Area (PHT): 4.68 cm     TR Peak grad:   43.8 mmHg MV Decel Time: 162 msec     TR Vmax:        331.00 cm/s MV E velocity: 114.00 cm/s MV A velocity: 63.40 cm/s   SHUNTS MV E/A ratio:  1.80         Systemic VTI:  0.13 m                             Systemic Diam: 2.80 cm Lyman Bishop MD Electronically signed by Lyman Bishop MD Signature Date/Time: 05/01/2020/2:22:06 PM    Final      Medications:     Scheduled Medications: . aspirin EC  81 mg Oral QPM  . Chlorhexidine Gluconate Cloth  6 each Topical Daily  . Chlorhexidine Gluconate Cloth  6 each Topical Daily  . clopidogrel  75 mg Oral QPM  . heparin  5,000 Units Subcutaneous Q8H  . sodium chloride flush  3 mL Intravenous Q12H    Infusions: . sodium chloride 10 mL/hr at 05/02/20 1000  . milrinone 0.25 mcg/kg/min (05/02/20 1000)    PRN Medications: sodium chloride, acetaminophen, alum & mag hydroxide-simeth, cyclobenzaprine, fluticasone, ipratropium-albuterol, ondansetron (ZOFRAN) IV, oxyCODONE, sodium chloride flush   Assessment/Plan:   1. Acute on Chronic systolic HF -> cardiogenic shock - long-standing ischemic cardiomyopathy.  - ECHO  12/31/19 showed EF 15- 20% RV moderately down, severe MR - Echo 05/01/20 LVEF 15% RV moderately down severe MR. Moderate TR - CPX 01/05/20 with pVO2: 15.8 (48% predicted peak VO2), slope 34 RER 0.96 VE/VCO2 slope: 34  - He has seen w Dr. Prescott Gum in the past and given previous dissection and aortic root replacement he is not a candidate for VAD here. - He has been seen by [facility] and [facility] - Dr. Wannetta Sender. Would consider high-risk VAD when ready. If not canddiate could consider MitraClip as I think MR is significantly contributing to symptoms - though he is more like [place] patient then [place] to deteriorate with NYHA IV symptoms with low output in setting of severe LV dysfunction and severe MR.  - Hemodynamics s/w severe biventricular failure with MR/TR. PaPI low.  Improved with milrinone support - Will d/w [facility] team in am. If not candidate for high-risk VAD may have to consider MitraClip as above - Hold diuretics today  2. CAD s/p CABG 2006:  - Cardiolite 11/2015 with evidence for mild reversibility involving the anterolateral wall and a portion of the lateral wall. 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  Hold Plavix in case he qualifies for advanced therapies - Statin intolerance at any dose. Also unable to tolerate Zetia - has been  ref[facility] for possible PCSK-9   3. 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 H/o Type I aortic dissection: Follows with CVTS.  - He has seen w Dr. Prescott Gum. Given previous dissection and aortic root replacement likely not candidate for VAD. - Echo 04/18/18: aortic root, ascending aorta, and aortic arch all mildly dilated - ECHO 12/2019: Ao Root diam: 5.10 cm, Ao Asc diam: 4.30 cm  5. Severe MR  - Severe on ECHO and exam - See MitraClip discussion above  6. Hypokalemia/Hypomag - will supp  CRITICAL CARE Performed by: Glori Bickers  Total critical care time: 35 minutes  Critical care time was exclusive of separately billable procedures and treating other patients.  Critical care was necessary to treat or prevent imminent or life-threatening deterioration.  Critical care was time spent personally by me (independent of midlevel providers or residents) on the following activities: development of treatment plan with patient and/or surrogate as well as nursing, discussions with consultants, evaluation of patient's response to treatment, examination of patient, obtaining history from patient or surrogate, ordering and performing treatments and interventions,  ordering and review of laboratory studies, ordering and review of radiographic studies, pulse oximetry and re-evaluation of patient's condition.    Length of Stay: 2   Glori Bickers MD 05/02/2020, 12:19 PM  Advanced Heart Failure Team Pager [phone] (M-F; 7a - 4p)  Please contact [facility] for night-coverage after hours (4p -7a ) and weekends on [email or web address]

## 2020-05-03 LAB — BASIC METABOLIC PANEL
Anion gap: 7 (ref 5–15)
Anion gap: 7 (ref 5–15)
BUN: 40 mg/dL — ABNORMAL HIGH (ref 6–20)
BUN: 38 mg/dL — ABNORMAL HIGH (ref 6–20)
CO2: 27 mmol/L (ref 22–32)
CO2: 27 mmol/L (ref 22–32)
Calcium: 8.4 mg/dL — ABNORMAL LOW (ref 8.9–10.3)
Calcium: 8.3 mg/dL — ABNORMAL LOW (ref 8.9–10.3)
Chloride: 106 mmol/L (ref 98–111)
Chloride: 104 mmol/L (ref 98–111)
Creatinine, Ser: 1.75 mg/dL — ABNORMAL HIGH (ref 0.61–1.24)
Creatinine, Ser: 1.71 mg/dL — ABNORMAL HIGH (ref 0.61–1.24)
GFR calc Af Amer: 49 mL/min — ABNORMAL LOW (ref >60)
GFR calc Af Amer: 50 mL/min — ABNORMAL LOW (ref >60)
GFR calc non Af Amer: 42 mL/min — ABNORMAL LOW (ref >60)
GFR calc non Af Amer: 43 mL/min — ABNORMAL LOW (ref >60)
Glucose, Bld: 114 mg/dL — ABNORMAL HIGH (ref 70–99)
Glucose, Bld: 129 mg/dL — ABNORMAL HIGH (ref 70–99)
Potassium: 5.3 mmol/L — ABNORMAL HIGH (ref 3.5–5.1)
Potassium: 4.8 mmol/L (ref 3.5–5.1)
Sodium: 140 mmol/L (ref 135–145)
Sodium: 138 mmol/L (ref 135–145)

## 2020-05-03 LAB — COOXEMETRY PANEL
Carboxyhemoglobin: 1.5 % (ref 0.5–1.5)
Carboxyhemoglobin: 1.8 % — ABNORMAL HIGH (ref 0.5–1.5)
Methemoglobin: 1.3 % (ref 0.0–1.5)
Methemoglobin: 1.3 % (ref 0.0–1.5)
O2 Saturation: 45.6 %
O2 Saturation: 59.1 %
Total hemoglobin: 12.7 g/dL (ref 12.0–16.0)
Total hemoglobin: 11.9 g/dL — ABNORMAL LOW (ref 12.0–16.0)

## 2020-05-03 LAB — CBC
HCT: 38.9 % — ABNORMAL LOW (ref 39.0–52.0)
Hemoglobin: 12.2 g/dL — ABNORMAL LOW (ref 13.0–17.0)
MCH: 26.6 pg (ref 26.0–34.0)
MCHC: 31.4 g/dL (ref 30.0–36.0)
MCV: 84.7 fL (ref 80.0–100.0)
Platelets: 88 10*3/uL — ABNORMAL LOW (ref 150–400)
RBC: 4.59 MIL/uL (ref 4.22–5.81)
RDW: 15.6 % — ABNORMAL HIGH (ref 11.5–15.5)
WBC: 4.6 10*3/uL (ref 4.0–10.5)
nRBC: 0 % (ref 0.0–0.2)

## 2020-05-03 LAB — MAGNESIUM
Magnesium: 2.5 mg/dL — ABNORMAL HIGH (ref 1.7–2.4)
Magnesium: 2.5 mg/dL — ABNORMAL HIGH (ref 1.7–2.4)

## 2020-05-03 LAB — SARS CORONAVIRUS 2 BY RT PCR (HOSPITAL ORDER, PERFORMED IN ~~LOC~~ HOSPITAL LAB): SARS Coronavirus 2: NEGATIVE

## 2020-05-03 MED ORDER — ACETAMINOPHEN 325 MG PO TABS: 650.0000 mg | ORAL_TABLET | ORAL | Status: DC | PRN

## 2020-05-03 MED ORDER — MILRINONE LACTATE IN DEXTROSE 20-5 MG/100ML-% IV SOLN: 0.3750 ug/kg/min | INTRAVENOUS | Status: DC

## 2020-05-03 MED ORDER — FUROSEMIDE 10 MG/ML IJ SOLN
40.0000 mg | Freq: Once | INTRAMUSCULAR | Status: AC
  Administered 2020-05-03: 09:00:00 40 mg via INTRAVENOUS
  Filled 2020-05-03: qty 4

## 2020-05-03 MED ORDER — ALUM & MAG HYDROXIDE-SIMETH 200-200-20 MG/5ML PO SUSP: 15.0000 mL | Freq: Four times a day (QID) | ORAL | 0 refills | Status: DC | PRN

## 2020-05-03 MED ORDER — NITROGLYCERIN 0.4 MG SL SUBL
SUBLINGUAL_TABLET | SUBLINGUAL | Status: AC
  Administered 2020-05-03: 14:00:00 0.4 mg
  Filled 2020-05-03: qty 1

## 2020-05-03 MED ORDER — HEPARIN SODIUM (PORCINE) 5000 UNIT/ML IJ SOLN: 5000.0000 [IU] | Freq: Three times a day (TID) | INTRAMUSCULAR | Status: DC

## 2020-05-03 MED ORDER — NITROGLYCERIN 0.4 MG SL SUBL
SUBLINGUAL_TABLET | SUBLINGUAL | Status: AC
  Filled 2020-05-03: qty 1

## 2020-05-03 MED ORDER — ONDANSETRON HCL 4 MG/2ML IJ SOLN: 4.0000 mg | Freq: Four times a day (QID) | INTRAMUSCULAR | 0 refills | Status: DC | PRN

## 2020-05-03 NOTE — Plan of Care (Signed)
  Problem: Education: Goal: Knowledge of General Education information will improve Description: Including pain rating scale, medication(s)/side effects and non-pharmacologic comfort measures Outcome: Progressing   Problem: Health Behavior/Discharge Planning: Goal: Ability to manage health-related needs will improve Outcome: Progressing   Problem: Clinical Measurements: Goal: Ability to maintain clinical measurements within normal limits will improve Outcome: Progressing Goal: Diagnostic test results will improve Outcome: Progressing   Problem: Nutrition: Goal: Adequate nutrition will be maintained Outcome: Progressing   

## 2020-05-03 NOTE — Plan of Care (Signed)
Pt is alert and oriented, on 3L [place], breathing is even and unlabored. Pt denies shortness of breath. Pt has complained of back pain throughout the night, tosses and turns frequently. Pt has been reeducated on IV lines and swan. Prn medications, back rubs, and heating pads have been given. See MAR. Pt denies nausea and abdominal pain at this time. BP has been stable. Call bell is within reach and bed is in lowest position.  Problem: Education: Goal: Knowledge of General Education information will improve Description: Including pain rating scale, medication(s)/side effects and non-pharmacologic comfort measures Outcome: Progressing   Problem: Clinical Measurements: Goal: Respiratory complications will improve Outcome: Progressing   Problem: Pain Managment: Goal: General experience of comfort will improve Outcome: Not Progressing   Problem: Safety: Goal: Ability to remain free from injury will improve Outcome: Progressing

## 2020-05-03 NOTE — Progress Notes (Signed)
[facility] at bedside to transfer patient to [facility]. Patient's wife at bedside. Report called.  Joellen Jersey, RN

## 2020-05-03 NOTE — Progress Notes (Addendum)
Advanced Heart Failure Rounding Note   Subjective:    Admitted 5/7 for low output symptoms with profound fatigue and worsening dyspnea.   Remains on milrinone 0.25. Co-ox 66% Creatinine improved 2.4 -> 1.9->1.7   Yesterday diuretics held.   Complaining of back pain. Denies SOB   Swan#s CVP 11 PA 50/35  Papi 1.3  Thermo 5.8/2.1  CO-OX  46%    Objective:   Weight Range:  Vital Signs:   Temp:  [98.6 F (37 C)-100 F (37.8 C)] 99 F (37.2 C) (05/10 0700) Pulse Rate:  [67-116] 88 (05/10 0700) Resp:  [16-31] 16 (05/10 0700) BP: (93-117)/(65-96) 100/65 (05/10 0700) SpO2:  [84 %-100 %] 99 % (05/10 0700) Weight:  [72.3 kg] 72.3 kg (05/10 0442) Last BM Date: 05/02/20  Weight change: Filed Weights   04/30/20 2318 05/02/20 0347 05/03/20 0442  Weight: 70.7 kg 73 kg 72.3 kg    Intake/Output:   Intake/Output Summary (Last 24 hours) at 05/03/2020 0727 Last data filed at 05/03/2020 0700 Gross per 24 hour  Intake 1049.98 ml  Output 1950 ml  Net -900.02 ml     Physical Exam: CVP 11 General:  Appears weak Neck: supple. JVP 9-10 . Carotids 2+ bilat; no bruits. No lymphadenopathy or thryomegaly appreciated. RIJ swan  Cor: PMI nondisplaced. Regular rate & rhythm. No rubs,  or murmurs. +S3  3/6 MR Lungs: clear Abdomen: soft, nontender, nondistended. No hepatosplenomegaly. No bruits or masses. Good bowel sounds. Extremities: no cyanosis, clubbing, rash, edema Neuro: alert & orientedx3, cranial nerves grossly intact. moves all 4 extremities w/o difficulty. Affect pleasant  Telemetry: NSR brief NSVT  Personally reviewed   Labs: Basic Metabolic Panel: Recent Labs  Lab 04/28/20 1008 04/28/20 1008 04/30/20 1937 04/30/20 1937 05/01/20 0410 05/01/20 0410 05/02/20 0322 05/02/20 2201 05/03/20 0430  NA 138  --  138  --  141  --  140 137 140  K 3.3*   < > 4.4  --  4.1  --  3.4* 4.7 5.3*  CL 96   < > 98  --  102  --  108 105 106  CO2 32*   < > 25  --  27  --  26 24 27     GLUCOSE 108*  --  104*  --  100*  --  105* 108* 114*  BUN 60*  --  62*  --  62*  --  55* 41* 40*  CREATININE 2.28*   < > 2.59*  --  2.39*  --  1.97* 1.67* 1.75*  CALCIUM 8.5*   < > 8.3*   < > 8.3*   < > 7.0* 7.9* 8.4*  MG 1.9  --   --   --  2.0  --  1.6*  --  2.5*   < > = values in this interval not displayed.    Liver Function Tests: Recent Labs  Lab 04/28/20 1008 04/30/20 1937  AST 20 40  ALT 22 33  ALKPHOS 92 83  BILITOT 0.8 1.3*  PROT 7.1 6.8  ALBUMIN 4.5 4.0   No results for input(s): LIPASE, AMYLASE in the last 168 hours. No results for input(s): AMMONIA in the last 168 hours.  CBC: Recent Labs  Lab 04/28/20 1008 04/30/20 1937 05/01/20 0410 05/02/20 0322 05/03/20 0430  WBC 3.4 3.3* 3.6* 4.1 4.6  HGB 14.1 13.8 13.4 11.7* 12.2*  HCT 41.7 43.1 41.2 36.3* 38.9*  MCV 80 85.0 83.7 83.4 84.7  PLT 111* 96* 97* 85* 88*  Cardiac Enzymes: No results for input(s): CKTOTAL, CKMB, CKMBINDEX, TROPONINI in the last 168 hours.  BNP: BNP (last 3 results) Recent Labs    03/22/20 1348 04/28/20 1008 04/30/20 1937  BNP 1,710.7* 2,504.3* 3,460.9*    ProBNP (last 3 results) No results for input(s): PROBNP in the last 8760 hours.    Other results:  Imaging: DG Chest 1 View  Result Date: 05/01/2020 CLINICAL DATA:  Central catheter placement EXAM: CHEST  1 VIEW COMPARISON:  Apr 30, 2020 FINDINGS: Swan-Ganz catheter tip is in the distal right lower lobe pulmonary artery. No pneumothorax. There is new airspace opacity in the right mid lung. Atelectatic change noted in left base. Lungs elsewhere clear. There is cardiomegaly. There is prominence of the aorta with apparent aneurysmal dilatation, stable. Pacemaker lead attached to right ventricle. Status post coronary artery bypass grafting. Surgical clips in the left cervical-thoracic junction region again noted. IMPRESSION: Swan-Ganz catheter tip is in the distal right pulmonary artery. Advise withdrawing this catheter  approximately 6 cm. New ill-defined airspace opacity right mid lung consistent with developing pneumonia. Persistent left lower lobe atelectatic change. These results will be called to the ordering clinician or representative by the Radiologist Assistant, and communication documented in the PACS or [employer]. Electronically Signed   By: Lowella Grip III M.D.   On: 05/01/2020 11:46   ECHOCARDIOGRAM COMPLETE  Result Date: 05/01/2020    ECHOCARDIOGRAM REPORT   Patient Name:   Chad [name]. Date of Exam: 05/01/2020 Medical Rec #:  [id number]            Height:       71.0 in Accession #:    [id number]           Weight:       155.9 lb Date of Birth:  1961-09-15             BSA:          1.897 m Patient Age:    59 years             BP:           89/68 mmHg Patient Gender: M                    HR:           84 bpm. Exam Location:  Inpatient Procedure: 2D Echo, Cardiac Doppler and Color Doppler Indications:    CHF  History:        Patient has prior history of Echocardiogram examinations, most                 recent 12/31/2019. CHF and Cardiomyopathy, CAD and Previous                 Myocardial Infarction, Prior CABG and Defibrillator, Aortic                 Dissection, Mitral Valve Disease and Aortic Valve Disease,                 Signs/Symptoms:Shortness of Breath and Chest Pain; Risk                 Factors:Hypertension.  Sonographer:    Dustin Flock Referring Phys: [id number] [place]  1. Left ventricular ejection fraction, by estimation, is <20%. The left ventricle has severely decreased function. The left ventricle demonstrates global hypokinesis. The left ventricular internal cavity size was severely dilated. There is mild left ventricular hypertrophy. Left  ventricular diastolic parameters are consistent with Grade III diastolic dysfunction (restrictive). Elevated left ventricular end-diastolic pressure.  2. Right ventricular systolic function is mildly reduced. The right  ventricular size is moderately enlarged. A AICD wire is visualized. There is moderately elevated pulmonary artery systolic pressure. The estimated right ventricular systolic pressure is  0000000 mmHg.  3. Left atrial size was severely dilated.  4. Right atrial size was severely dilated.  5. The mitral valve is abnormal. Moderate to severe mitral valve regurgitation.  6. Tricuspid valve regurgitation is mild to moderate.  7. The aortic valve is tricuspid. Aortic valve regurgitation is mild to moderate.  8. The inferior vena cava is dilated in size with <50% respiratory variability, suggesting right atrial pressure of 15 mmHg. Comparison(s): Changes from prior study are noted. 12/30/2019: LVEF 15-20%, grade III DD. FINDINGS  Left Ventricle: Left ventricular ejection fraction, by estimation, is <20%. The left ventricle has severely decreased function. The left ventricle demonstrates global hypokinesis. The left ventricular internal cavity size was severely dilated. There is mild left ventricular hypertrophy. Left ventricular diastolic parameters are consistent with Grade III diastolic dysfunction (restrictive). Elevated left ventricular end-diastolic pressure. Right Ventricle: The right ventricular size is moderately enlarged. No increase in right ventricular wall thickness. Right ventricular systolic function is mildly reduced. There is moderately elevated pulmonary artery systolic pressure. The tricuspid regurgitant velocity is 3.31 m/s, and with an assumed right atrial pressure of 15 mmHg, the estimated right ventricular systolic pressure is 0000000 mmHg. Left Atrium: Left atrial size was severely dilated. Right Atrium: Right atrial size was severely dilated. Pericardium: There is no evidence of pericardial effusion. Mitral Valve: The mitral valve is abnormal. There is mild thickening of the mitral valve leaflet(s). Moderate to severe mitral valve regurgitation, with centrally-directed jet. Tricuspid Valve: The tricuspid  valve is grossly normal. Tricuspid valve regurgitation is mild to moderate. Aortic Valve: The aortic valve is tricuspid. Aortic valve regurgitation is mild to moderate. Aortic regurgitation PHT measures 299 msec. Pulmonic Valve: The pulmonic valve was grossly normal. Pulmonic valve regurgitation is not visualized. Aorta: The aortic root is normal in size and structure. Venous: The inferior vena cava is dilated in size with less than 50% respiratory variability, suggesting right atrial pressure of 15 mmHg. IAS/Shunts: No atrial level shunt detected by color flow Doppler. Additional Comments: A AICD wire is visualized.  LEFT VENTRICLE PLAX 2D LVIDd:         7.11 cm  Diastology LVIDs:         6.56 cm  LV e' lateral:   5.81 cm/s LV PW:         1.13 cm  LV E/e' lateral: 19.6 LV IVS:        1.03 cm  LV e' medial:    2.81 cm/s LVOT diam:     2.80 cm  LV E/e' medial:  40.6 LV SV:         81 LV SV Index:   43 LVOT Area:     6.16 cm  RIGHT VENTRICLE RV Basal diam:  4.10 cm RV S prime:     7.14 cm/s TAPSE (M-mode): 2.0 cm LEFT ATRIUM              Index       RIGHT ATRIUM           Index LA diam:        5.80 cm  3.06 cm/m  RA Area:     30.10 cm LA Vol (A2C):  134.0 ml 70.65 ml/m RA Volume:   113.00 ml 59.58 ml/m LA Vol (A4C):   123.0 ml 64.85 ml/m LA Biplane Vol: 133.0 ml 70.12 ml/m  AORTIC VALVE LVOT Vmax:   69.20 cm/s LVOT Vmean:  39.200 cm/s LVOT VTI:    0.132 m AI PHT:      299 msec  AORTA Ao Root diam: 4.60 cm MITRAL VALVE                TRICUSPID VALVE MV Area (PHT): 4.68 cm     TR Peak grad:   43.8 mmHg MV Decel Time: 162 msec     TR Vmax:        331.00 cm/s MV E velocity: 114.00 cm/s MV A velocity: 63.40 cm/s   SHUNTS MV E/A ratio:  1.80         Systemic VTI:  0.13 m                             Systemic Diam: 2.80 cm Lyman Bishop MD Electronically signed by Lyman Bishop MD Signature Date/Time: 05/01/2020/2:22:06 PM    Final      Medications:     Scheduled Medications: . aspirin EC  81 mg Oral QPM  .  Chlorhexidine Gluconate Cloth  6 each Topical Daily  . Chlorhexidine Gluconate Cloth  6 each Topical Daily  . heparin  5,000 Units Subcutaneous Q8H  . hydrALAZINE  10 mg Intravenous Once  . sodium chloride flush  3 mL Intravenous Q12H    Infusions: . sodium chloride 10 mL/hr at 05/03/20 0700  . milrinone 0.25 mcg/kg/min (05/03/20 0700)    PRN Medications: sodium chloride, acetaminophen, alum & mag hydroxide-simeth, cyclobenzaprine, fluticasone, ipratropium-albuterol, ondansetron (ZOFRAN) IV, oxyCODONE, sodium chloride flush   Assessment/Plan:   1. Acute on Chronic systolic HF -> cardiogenic shock - long-standing ischemic cardiomyopathy.  - ECHO  12/31/19 showed EF 15- 20% RV moderately down, severe MR - Echo 05/01/20 LVEF 15% RV moderately down severe MR. Moderate TR - CPX 01/05/20 with pVO2: 15.8 (48% predicted peak VO2), slope 34 RER 0.96 VE/VCO2 slope: 34  - He has seen w Dr. Prescott Gum in the past and given previous dissection and aortic root replacement he is not a candidate for VAD here. - He has been seen by [facility] and [facility] - Dr. Wannetta Sender. Would consider high-risk VAD when ready. If not canddiate could consider MitraClip as I think MR is significantly contributing to symptoms - though he is more like [place] patient then [place] to deteriorate with NYHA IV symptoms with low output in setting of severe LV dysfunction and severe MR.  - On admit I think he was relatively volume deplete with low output. Hemodynamics s/w severe biventricular failure with MR/TR.  - Improved with milrinone.  - CVP trending up-->11. Will need 40 mg IV lasix x1.  - No BB with shock  - No arb/dig with elevated creatinine - Will d/w [facility] team in am. If not candidate for high-risk VAD may have to consider MitraClip as above  2. CAD s/p CABG 2006:  - Cardiolite 11/2015 with evidence for mild reversibility involving the anterolateral wall and a portion of the lateral wall. 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  - Continue ASA 81 mg daily.  Hold Plavix in case he qualifies for  advanced therapies - Statin intolerance at any dose. Also unable to tolerate Zetia - has been referred to [facility] for possible [facility]-9   3. 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 H/o Type I aortic dissection: Follows with CVTS.  - He has seen w Dr. Prescott Gum. Given previous dissection and aortic root replacement likely not candidate for VAD. - Echo 04/18/18: aortic root, ascending aorta, and aortic arch all mildly dilated - ECHO 12/2019: Ao Root diam: 5.10 cm, Ao Asc diam: 4.30 cm  5. Severe MR  - Severe on ECHO and exam - See MitraClip discussion above  6. Hypokalemia/Hypomag K and Mag high today.   7. Hyperkalemia  K 5.3. Should improve with IV lasix.   8. NSVT  Brief NSVT. K 5.3 today and Mag 2.5   9. Thrombocytopenia Platelets trending down 96>88 Follow CBC daily.   Length of Stay: 3  Amy Clegg NP-C  05/03/2020, 7:27 AM  Advanced Heart Failure Team Pager [phone] (M-F; 7a - 4p)  Please contact [facility] for night-coverage after hours (4p -7a ) and weekends on [email or web address]  Agree with above  Hemodynamics remain marginal today. Creatinine back down to baseline. Feels ok on milrinone 0.25   General:  Lying flat. Weak appearing. No resp difficulty HEENT: normal Neck: supple. RIJ swan  JVP to jaw Carotids 2+ bilat; no bruits. No lymphadenopathy or thryomegaly appreciated. Cor: PMI nondisplaced. Regular rate & rhythm. 2/6 TR, 3/6 R Lungs: clear Abdomen: soft, nontender, nondistended. No hepatosplenomegaly. No bruits or masses. Good bowel sounds. Extremities: no cyanosis, clubbing, rash, edema Neuro: alert & orientedx3, cranial nerves grossly intact. moves  all 4 extremities w/o difficulty. Affect pleasant  Remains very tenuous. Only option likely high-risk VAD but multiple obstacles including RV failure, CKD 3b and AoR replacement with 2 previous sternotomies. PLTs also running low (suspect due to RV failure +/- MR).  I have d/w [facility] team and wiling to re-evaluate. Will plan transfer when bed available. Mr. Ahlstrand clearly understands that surgery may not be feasible. Will increase milrinone to 0.375 and repeat MV sat.   CRITICAL CARE Performed by: Glori Bickers  Total critical care time: 45 minutes  Critical care time was exclusive of separately billable procedures and treating other patients.  Critical care was necessary to treat or prevent imminent or life-threatening deterioration.  Critical care was time spent personally by me (independent of midlevel providers or residents) on the following activities: development of treatment plan with patient and/or surrogate as well as nursing, discussions with consultants, evaluation of patient's response to treatment, examination of patient, obtaining history from patient or surrogate, ordering and performing treatments and interventions, ordering and review of laboratory studies, ordering and review of radiographic studies, pulse oximetry and re-evaluation of patient's condition.  Glori Bickers, MD  9:15 AM

## 2020-05-03 NOTE — Discharge Summary (Addendum)
Advanced Heart Failure Team  Discharge Summary   Patient ID: Ernst Laurice Record. MRN: [id number], DOB/AGE: November 21, 1961 59 y.o. 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 male with history of severe HTN, coronary artery disease status post previous myocardial infarction and bypass surgery in 2006 also DES to native PDA in 2011.  He also has a history chronic systolic heart failure EF 15-20%, ICM, single chamber ICD [employer]. In 2010 he had a large Type I aortic dissection all the way down to illiacs involving left kidney. 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 Also with significant low back pain s/p 2 surgeries.   He has seen w Dr. Prescott Gum in the past and given previous dissection and aortic root replacement he is not a candidate for VAD here. He has been seen by [facility] but not felt to be transplant candidate but could possibly be candidate for high risk VAD.   He has been followed closely by Dr Haroldine Laws  in the [facility]. He was last seen on May 6th and had a functional decline. Creatinine was up and BNP remained high in the setting of severe MR. There was concern he had been over diuresed with torsemide. BB was held due to progressive HF.   He presented to [facility] ED the next day with increased shortness of breath and profound fatigue.  On admit diuretics, spiro and entresto due to AKI. Dr Haroldine Laws evaluated and he was taken to the cath lab for swan placement that showed biventricular failure with  MR/TR and low Papi. Placed on milrinone with improved hemodynamics.     Today mixed sat down to 46% so milrinone was increased to 0.375 mcg. CVP trending up and was given a dose of IV lasix.   Dr Windy Kalata discussed with [facility] and he was accepted for consideration of high risk VAD. Only option likely high-risk VAD but multiple obstacles including RV failure, CKD 3b and AoR replacement with 2 previous sternotomies. PLTs also running low (suspect due to RV failure +/- MR).    05/03/20 Swan numbers  On milrinone 0.25 mcg--> milrinone increased to 0.375 mcg.  CVP 11 PA 50/35  Papi 1.3  Thermo 5.8/2.1  CO-OX  46%   05/01/20 RHC CVP 9 (v-waves to 15) PA 47/31 (39) PCWP 21 (v-waves to 35) Thermo 3.4/1.78 SVR 1860 PVR 4.5 WU PAPi  1.8 Started on milrinone   Discharge Vitals: Blood pressure (!) 117/96, pulse 90, temperature 98.8 F (37.1 C), resp. rate (!) 26, height 5\' 11"  (1.803 m), weight 72.3 kg, SpO2 98 %.  Labs: Lab Results  Component Value Date   WBC 4.6 05/03/2020   HGB 12.2 (L) 05/03/2020   HCT 38.9 (L) 05/03/2020   MCV 84.7 05/03/2020   PLT 88 (L) 05/03/2020    Recent Labs  Lab 04/30/20 1937 05/01/20 0410 05/03/20 0430  NA 138   < > 140  K 4.4   < > 5.3*  CL 98   < > 106  CO2 25   < >  27  BUN 62*   < > 40*  CREATININE 2.59*   < > 1.75*  CALCIUM 8.3*   < > 8.4*  PROT 6.8  --   --   BILITOT 1.3*  --   --   ALKPHOS 83  --   --   ALT 33  --   --   AST 40  --   --   GLUCOSE 104*   < > 114*   < > = values in this interval not displayed.   Lab Results  Component Value Date   CHOL 206 (H) 05/21/2019   HDL 57 05/21/2019   LDLCALC 132 (H) 05/21/2019   TRIG 83 05/21/2019   BNP (last 3 results) Recent Labs    03/22/20 1348 04/28/20 1008 04/30/20 1937  BNP 1,710.7* 2,504.3* 3,460.9*    ProBNP (last 3 results) No results for input(s): PROBNP in the last 8760 hours.   Diagnostic Studies/Procedures   DG Chest 1 View  Result Date: 05/01/2020 CLINICAL DATA:   Central catheter placement EXAM: CHEST  1 VIEW COMPARISON:  Apr 30, 2020 FINDINGS: Swan-Ganz catheter tip is in the distal right lower lobe pulmonary artery. No pneumothorax. There is new airspace opacity in the right mid lung. Atelectatic change noted in left base. Lungs elsewhere clear. There is cardiomegaly. There is prominence of the aorta with apparent aneurysmal dilatation, stable. Pacemaker lead attached to right ventricle. Status post coronary artery bypass grafting. Surgical clips in the left cervical-thoracic junction region again noted. IMPRESSION: Swan-Ganz catheter tip is in the distal right pulmonary artery. Advise withdrawing this catheter approximately 6 cm. New ill-defined airspace opacity right mid lung consistent with developing pneumonia. Persistent left lower lobe atelectatic change. These results will be called to the ordering clinician or representative by the Radiologist Assistant, and communication documented in the PACS or [employer]. Electronically Signed   By: Lowella Grip III M.D.   On: 05/01/2020 11:46   ECHOCARDIOGRAM COMPLETE  Result Date: 05/01/2020    ECHOCARDIOGRAM REPORT   Patient Name:   Paxton Carlone. Date of Exam: 05/01/2020 Medical Rec #:  [id number]            Height:       71.0 in Accession #:    [id number]           Weight:       155.9 lb Date of Birth:  12-19-1961             BSA:          1.897 m Patient Age:    61 years             BP:           89/68 mmHg Patient Gender: M                    HR:           84 bpm. Exam Location:  Inpatient Procedure: 2D Echo, Cardiac Doppler and Color Doppler Indications:    CHF  History:        Patient has prior history of Echocardiogram examinations, most                 recent 12/31/2019. CHF and Cardiomyopathy, CAD and Previous                 Myocardial Infarction, Prior CABG and Defibrillator, Aortic  Dissection, Mitral Valve Disease and Aortic Valve Disease,                 Signs/Symptoms:Shortness of  Breath and Chest Pain; Risk                 Factors:Hypertension.  Sonographer:    Dustin Flock Referring Phys: [id number] [place]  1. Left ventricular ejection fraction, by estimation, is <20%. The left ventricle has severely decreased function. The left ventricle demonstrates global hypokinesis. The left ventricular internal cavity size was severely dilated. There is mild left ventricular hypertrophy. Left ventricular diastolic parameters are consistent with Grade III diastolic dysfunction (restrictive). Elevated left ventricular end-diastolic pressure.  2. Right ventricular systolic function is mildly reduced. The right ventricular size is moderately enlarged. A AICD wire is visualized. There is moderately elevated pulmonary artery systolic pressure. The estimated right ventricular systolic pressure is  0000000 mmHg.  3. Left atrial size was severely dilated.  4. Right atrial size was severely dilated.  5. The mitral valve is abnormal. Moderate to severe mitral valve regurgitation.  6. Tricuspid valve regurgitation is mild to moderate.  7. The aortic valve is tricuspid. Aortic valve regurgitation is mild to moderate.  8. The inferior vena cava is dilated in size with <50% respiratory variability, suggesting right atrial pressure of 15 mmHg. Comparison(s): Changes from prior study are noted. 12/30/2019: LVEF 15-20%, grade III DD. FINDINGS  Left Ventricle: Left ventricular ejection fraction, by estimation, is <20%. The left ventricle has severely decreased function. The left ventricle demonstrates global hypokinesis. The left ventricular internal cavity size was severely dilated. There is mild left ventricular hypertrophy. Left ventricular diastolic parameters are consistent with Grade III diastolic dysfunction (restrictive). Elevated left ventricular end-diastolic pressure. Right Ventricle: The right ventricular size is moderately enlarged. No increase in right ventricular wall  thickness. Right ventricular systolic function is mildly reduced. There is moderately elevated pulmonary artery systolic pressure. The tricuspid regurgitant velocity is 3.31 m/s, and with an assumed right atrial pressure of 15 mmHg, the estimated right ventricular systolic pressure is 0000000 mmHg. Left Atrium: Left atrial size was severely dilated. Right Atrium: Right atrial size was severely dilated. Pericardium: There is no evidence of pericardial effusion. Mitral Valve: The mitral valve is abnormal. There is mild thickening of the mitral valve leaflet(s). Moderate to severe mitral valve regurgitation, with centrally-directed jet. Tricuspid Valve: The tricuspid valve is grossly normal. Tricuspid valve regurgitation is mild to moderate. Aortic Valve: The aortic valve is tricuspid. Aortic valve regurgitation is mild to moderate. Aortic regurgitation PHT measures 299 msec. Pulmonic Valve: The pulmonic valve was grossly normal. Pulmonic valve regurgitation is not visualized. Aorta: The aortic root is normal in size and structure. Venous: The inferior vena cava is dilated in size with less than 50% respiratory variability, suggesting right atrial pressure of 15 mmHg. IAS/Shunts: No atrial level shunt detected by color flow Doppler. Additional Comments: A AICD wire is visualized.  LEFT VENTRICLE PLAX 2D LVIDd:         7.11 cm  Diastology LVIDs:         6.56 cm  LV e' lateral:   5.81 cm/s LV PW:         1.13 cm  LV E/e' lateral: 19.6 LV IVS:        1.03 cm  LV e' medial:    2.81 cm/s LVOT diam:     2.80 cm  LV E/e' medial:  40.6 LV SV:  81 LV SV Index:   43 LVOT Area:     6.16 cm  RIGHT VENTRICLE RV Basal diam:  4.10 cm RV S prime:     7.14 cm/s TAPSE (M-mode): 2.0 cm LEFT ATRIUM              Index       RIGHT ATRIUM           Index LA diam:        5.80 cm  3.06 cm/m  RA Area:     30.10 cm LA Vol (A2C):   134.0 ml 70.65 ml/m RA Volume:   113.00 ml 59.58 ml/m LA Vol (A4C):   123.0 ml 64.85 ml/m LA Biplane Vol:  133.0 ml 70.12 ml/m  AORTIC VALVE LVOT Vmax:   69.20 cm/s LVOT Vmean:  39.200 cm/s LVOT VTI:    0.132 m AI PHT:      299 msec  AORTA Ao Root diam: 4.60 cm MITRAL VALVE                TRICUSPID VALVE MV Area (PHT): 4.68 cm     TR Peak grad:   43.8 mmHg MV Decel Time: 162 msec     TR Vmax:        331.00 cm/s MV E velocity: 114.00 cm/s MV A velocity: 63.40 cm/s   SHUNTS MV E/A ratio:  1.80         Systemic VTI:  0.13 m                             Systemic Diam: 2.80 cm Lyman Bishop MD Electronically signed by Lyman Bishop MD Signature Date/Time: 05/01/2020/2:22:06 PM    Final     Discharge Medications   Allergies as of 05/03/2020       Reactions   Contrast Media [iodinated Diagnostic Agents] Anaphylaxis   Iohexol Anaphylaxis, Other (See Comments)   PT HAS ANAPHYLAXIS WITH CONTRAST MEDIA!   Lipitor [atorvastatin Calcium] Anaphylaxis, Other (See Comments)   Large doses   Shellfish Allergy Anaphylaxis   Sulfa Antibiotics Shortness Of Breath, Swelling   Sulfonamide Derivatives Shortness Of Breath, Swelling   Almond Oil Itching   FACIAL/MOUTH ITCHING   Metrizamide Swelling   SWELLING REACTION UNSPECIFIED    Zocor [simvastatin] Other (See Comments)   Muscle cramps   Food Itching   Shellfish, peaches, almonds, apples & kiwis   Latex Rash, Other (See Comments)   With long periods of exposure   Peach [prunus Persica] Itching        Medication List     STOP taking these medications    albuterol 108 (90 Base) MCG/ACT inhaler Commonly known as: VENTOLIN HFA   allopurinol 100 MG tablet Commonly known as: ZYLOPRIM   clopidogrel 75 MG tablet Commonly known as: PLAVIX   cyanocobalamin 1000 MCG/ML injection Commonly known as: (VITAMIN B-12)   doxazosin 1 MG tablet Commonly known as: CARDURA   Entresto 49-51 MG Generic drug: sacubitril-valsartan   metolazone 2.5 MG tablet Commonly known as: ZAROXOLYN   Mitigare 0.6 MG Caps Generic drug: Colchicine   nitroGLYCERIN 0.4 MG SL  tablet Commonly known as: NITROSTAT   potassium chloride SA 20 MEQ tablet Commonly known as: KLOR-CON   Praluent 75 MG/ML Soaj Generic drug: Alirocumab   sildenafil 100 MG tablet Commonly known as: VIAGRA   torsemide 20 MG tablet Commonly known as: DEMADEX       TAKE these medications  acetaminophen 325 MG tablet Commonly known as: TYLENOL Take 2 tablets (650 mg total) by mouth every 4 (four) hours as needed for headache or mild pain.   alum & mag hydroxide-simeth 200-200-20 MG/5ML suspension Commonly known as: MAALOX/MYLANTA Take 15 mLs by mouth every 6 (six) hours as needed for indigestion or heartburn.   aspirin EC 81 MG tablet Take 81 mg by mouth every evening.   cyclobenzaprine 10 MG tablet Commonly known as: FLEXERIL Take 10 mg by mouth 3 (three) times daily as needed for muscle spasms.   fluticasone 50 MCG/ACT nasal spray Commonly known as: FLONASE Place 2 sprays into both nostrils daily as needed for allergies or rhinitis.   heparin 5000 UNIT/ML injection Inject 1 mL (5,000 Units total) into the skin every 8 (eight) hours.   ipratropium-albuterol 0.5-2.5 (3) MG/3ML Soln Commonly known as: DUONEB Take 3 mLs by nebulization every 6 (six) hours as needed (wheezing, sob).   milrinone 20 MG/100 ML Soln infusion Commonly known as: PRIMACOR Inject 0.0265 mg/min into the vein continuous.   ondansetron 4 MG/2ML Soln injection Commonly known as: ZOFRAN Inject 2 mLs (4 mg total) into the vein every 6 (six) hours as needed for nausea.   Oxycodone HCl 20 MG Tabs Take 20 mg by mouth every 6 (six) hours as needed (For pain).        Disposition   The patient will be discharged in stable condition to [facility] with milrinone 0.375 mcg.  Discharge Instructions     Diet - low sodium heart healthy   Complete by: As directed    Increase activity slowly   Complete by: As directed            Duration of Discharge Encounter: Greater than 35 minutes    Signed, Amy Clegg NP-C  05/03/2020, 9:42 AM  Patient seen and examined with the above-signed Advanced Practice Provider and/or Housestaff. I personally reviewed laboratory data, imaging studies and relevant notes. I independently examined the patient and formulated the important aspects of the plan. I have edited the note to reflect any of my changes or salient points. I have personally discussed the plan with the patient and/or family.  Case d/w [facility] team. Will continue milrinone. Transfer for evaluation for high-risk total/arch replacement and VAD. Appreciate the [facility] Team's support.   Glori Bickers, MD  2:17 PM

## 2020-05-04 DIAGNOSIS — I5023 Acute on chronic systolic (congestive) heart failure: Secondary | ICD-10-CM | POA: Diagnosis not present

## 2020-05-04 DIAGNOSIS — I7103 Dissection of thoracoabdominal aorta: Secondary | ICD-10-CM | POA: Diagnosis not present

## 2020-05-04 DIAGNOSIS — I517 Cardiomegaly: Secondary | ICD-10-CM | POA: Diagnosis not present

## 2020-05-04 DIAGNOSIS — Z79899 Other long term (current) drug therapy: Secondary | ICD-10-CM | POA: Diagnosis not present

## 2020-05-04 DIAGNOSIS — R570 Cardiogenic shock: Secondary | ICD-10-CM | POA: Diagnosis not present

## 2020-05-04 IMAGING — DX DG CHEST 2V
2 series · 2 of 2 positions shown · non-contrast
Comparison: Chest radiograph, 11/27/2018.  Chest CT, 10/11/2017.

CLINICAL DATA: Short of breath.  Chest pain for 3 days.

EXAM:
CHEST - 2 VIEW

[chest lat]
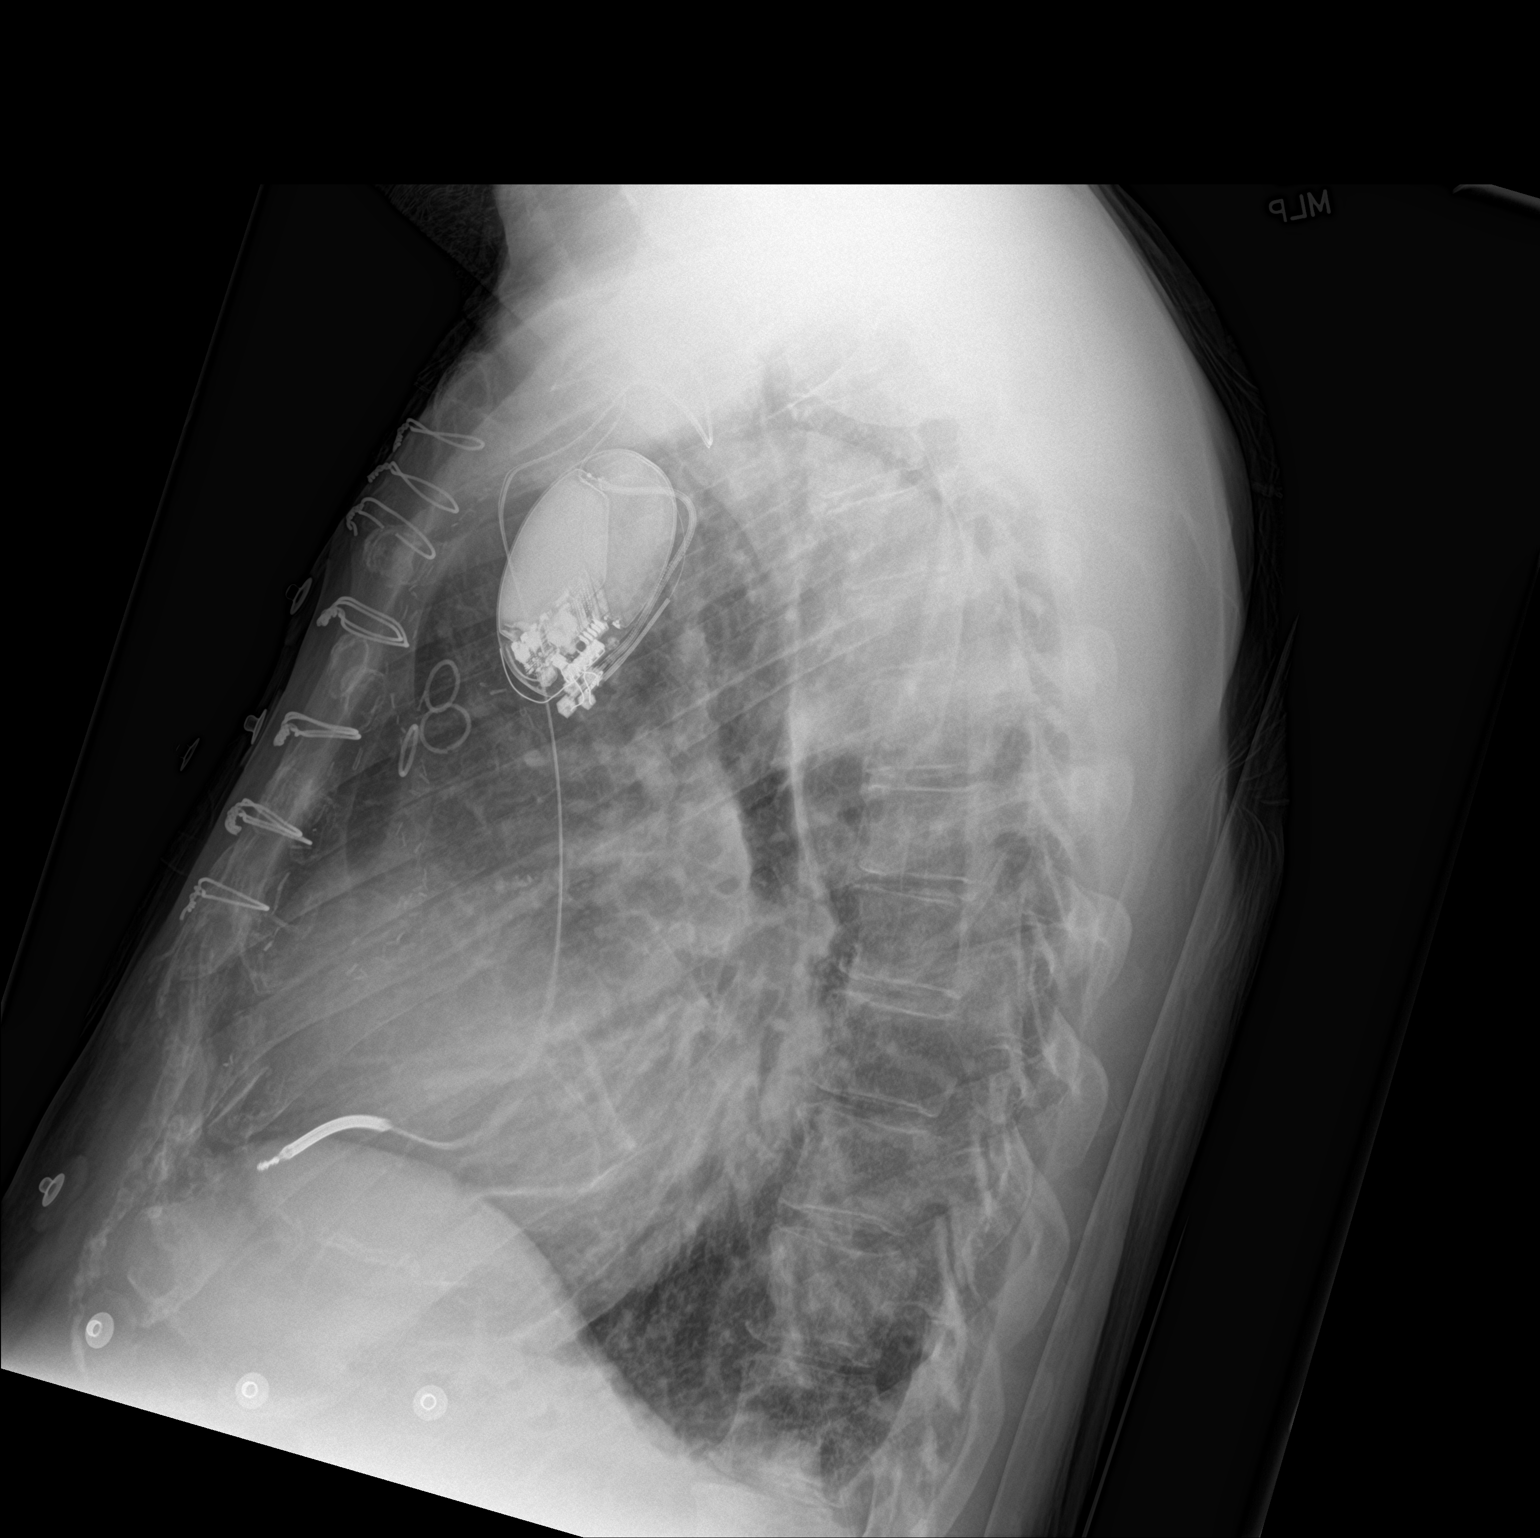

[chest ap]
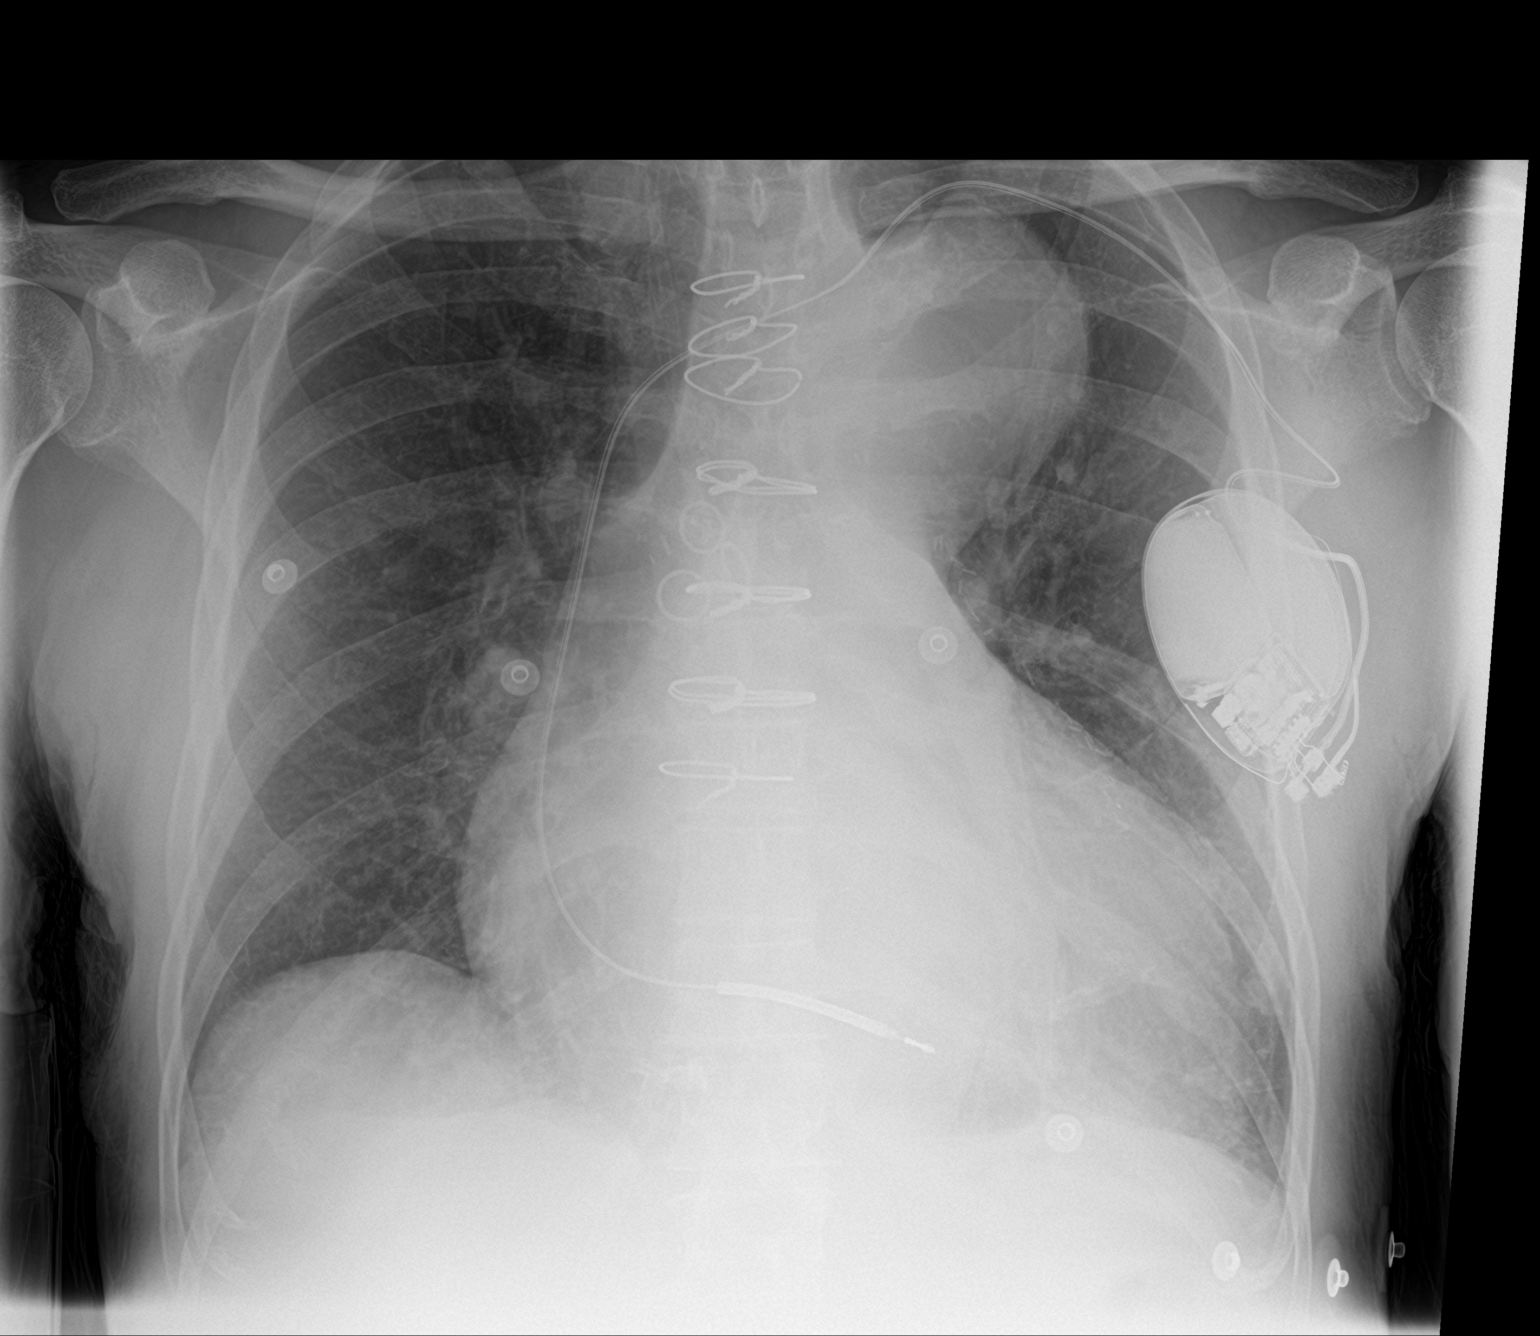

[2 of 2 positions shown; findings below may reference images not displayed]

FINDINGS: There are changes from prior CABG surgery, stable. Cardiac
silhouette is mild to moderately enlarged, unchanged. The aortic
arch is dilated reflecting the thoracic aneurysm noted on the prior
chest CT. It is stable from the most recent prior chest radiograph.

No mediastinal or hilar masses.  No evidence of adenopathy.

Left anterior chest wall ICD is stable.

Mild reticular type opacities are noted at the left lung base, most
likely atelectasis. Lungs are mildly hyperexpanded, but otherwise
clear. No pleural effusion or pneumothorax.

Skeletal structures are intact.
IMPRESSION: 1. No acute cardiopulmonary disease.
2. Cardiomegaly and thoracic aortic aneurysm.

## 2020-05-05 DIAGNOSIS — I5023 Acute on chronic systolic (congestive) heart failure: Secondary | ICD-10-CM | POA: Diagnosis not present

## 2020-05-05 DIAGNOSIS — Z951 Presence of aortocoronary bypass graft: Secondary | ICD-10-CM | POA: Diagnosis not present

## 2020-05-05 DIAGNOSIS — R918 Other nonspecific abnormal finding of lung field: Secondary | ICD-10-CM | POA: Diagnosis not present

## 2020-05-05 DIAGNOSIS — I7103 Dissection of thoracoabdominal aorta: Secondary | ICD-10-CM | POA: Diagnosis not present

## 2020-05-05 DIAGNOSIS — I517 Cardiomegaly: Secondary | ICD-10-CM | POA: Diagnosis not present

## 2020-05-06 DIAGNOSIS — I272 Pulmonary hypertension, unspecified: Secondary | ICD-10-CM | POA: Diagnosis not present

## 2020-05-06 DIAGNOSIS — I7103 Dissection of thoracoabdominal aorta: Secondary | ICD-10-CM | POA: Diagnosis not present

## 2020-05-06 DIAGNOSIS — Z79899 Other long term (current) drug therapy: Secondary | ICD-10-CM | POA: Diagnosis not present

## 2020-05-06 DIAGNOSIS — Z452 Encounter for adjustment and management of vascular access device: Secondary | ICD-10-CM | POA: Diagnosis not present

## 2020-05-06 DIAGNOSIS — I5023 Acute on chronic systolic (congestive) heart failure: Secondary | ICD-10-CM | POA: Diagnosis not present

## 2020-05-06 DIAGNOSIS — I77819 Aortic ectasia, unspecified site: Secondary | ICD-10-CM | POA: Diagnosis not present

## 2020-05-07 ENCOUNTER — Other Ambulatory Visit (HOSPITAL_COMMUNITY): Payer: Medicaid Other

## 2020-05-08 DIAGNOSIS — Z79899 Other long term (current) drug therapy: Secondary | ICD-10-CM | POA: Diagnosis not present

## 2020-05-10 ENCOUNTER — Ambulatory Visit (INDEPENDENT_AMBULATORY_CARE_PROVIDER_SITE_OTHER): Payer: Medicaid Other | Admitting: *Deleted

## 2020-05-10 DIAGNOSIS — I5042 Chronic combined systolic (congestive) and diastolic (congestive) heart failure: Secondary | ICD-10-CM | POA: Diagnosis not present

## 2020-05-10 LAB — CUP PACEART REMOTE DEVICE CHECK
Battery Remaining Longevity: 66 mo
Battery Remaining Percentage: 71 %
Brady Statistic RV Percent Paced: 0 %
Date Time Interrogation Session: 20210517080800
HighPow Impedance: 52 Ohm
Implantable Lead Implant Date: 20171114
Implantable Lead Location: 753860
Implantable Lead Model: 181
Implantable Lead Serial Number: 333496
Implantable Pulse Generator Implant Date: 20131220
Lead Channel Impedance Value: 443 Ohm
Lead Channel Pacing Threshold Amplitude: 0.8 V
Lead Channel Pacing Threshold Pulse Width: 0.4 ms
Lead Channel Setting Pacing Amplitude: 3.5 V
Lead Channel Setting Pacing Pulse Width: 0.4 ms
Lead Channel Setting Sensing Sensitivity: 0.5 mV
Pulse Gen Serial Number: 124654

## 2020-05-11 NOTE — Progress Notes (Signed)
Remote ICD transmission.   

## 2020-05-13 DIAGNOSIS — M5442 Lumbago with sciatica, left side: Secondary | ICD-10-CM | POA: Diagnosis not present

## 2020-05-13 DIAGNOSIS — M542 Cervicalgia: Secondary | ICD-10-CM | POA: Diagnosis not present

## 2020-05-13 DIAGNOSIS — M545 Low back pain: Secondary | ICD-10-CM | POA: Diagnosis not present

## 2020-05-13 DIAGNOSIS — M5431 Sciatica, right side: Secondary | ICD-10-CM | POA: Diagnosis not present

## 2020-05-14 DIAGNOSIS — Z79899 Other long term (current) drug therapy: Secondary | ICD-10-CM | POA: Diagnosis not present

## 2020-05-19 ENCOUNTER — Encounter (HOSPITAL_COMMUNITY): Payer: Medicaid Other | Admitting: Internal Medicine

## 2020-05-19 DIAGNOSIS — Z79899 Other long term (current) drug therapy: Secondary | ICD-10-CM | POA: Diagnosis not present

## 2020-05-21 ENCOUNTER — Other Ambulatory Visit: Payer: Self-pay

## 2020-05-21 ENCOUNTER — Ambulatory Visit (HOSPITAL_COMMUNITY)
Admission: RE | Admit: 2020-05-21 | Discharge: 2020-05-21 | Disposition: A | Discharge status: Home / Self Care | Payer: Medicaid Other | Source: Ambulatory Visit | Attending: Internal Medicine | Admitting: Internal Medicine

## 2020-05-21 VITALS — BP 110/76 | HR 100 | Wt 157.5 lb

## 2020-05-21 DIAGNOSIS — Z85850 Personal history of malignant neoplasm of thyroid: Secondary | ICD-10-CM | POA: Diagnosis not present

## 2020-05-21 DIAGNOSIS — Z7982 Long term (current) use of aspirin: Secondary | ICD-10-CM | POA: Insufficient documentation

## 2020-05-21 DIAGNOSIS — I2510 Atherosclerotic heart disease of native coronary artery without angina pectoris: Secondary | ICD-10-CM | POA: Diagnosis not present

## 2020-05-21 DIAGNOSIS — Z905 Acquired absence of kidney: Secondary | ICD-10-CM | POA: Diagnosis not present

## 2020-05-21 DIAGNOSIS — I472 Ventricular tachycardia: Secondary | ICD-10-CM | POA: Diagnosis not present

## 2020-05-21 DIAGNOSIS — N189 Chronic kidney disease, unspecified: Secondary | ICD-10-CM | POA: Diagnosis not present

## 2020-05-21 DIAGNOSIS — Z882 Allergy status to sulfonamides status: Secondary | ICD-10-CM | POA: Diagnosis not present

## 2020-05-21 DIAGNOSIS — Z951 Presence of aortocoronary bypass graft: Secondary | ICD-10-CM | POA: Diagnosis not present

## 2020-05-21 DIAGNOSIS — G4730 Sleep apnea, unspecified: Secondary | ICD-10-CM | POA: Insufficient documentation

## 2020-05-21 DIAGNOSIS — Z79899 Other long term (current) drug therapy: Secondary | ICD-10-CM | POA: Insufficient documentation

## 2020-05-21 DIAGNOSIS — Z95810 Presence of automatic (implantable) cardiac defibrillator: Secondary | ICD-10-CM | POA: Diagnosis not present

## 2020-05-21 DIAGNOSIS — I5022 Chronic systolic (congestive) heart failure: Secondary | ICD-10-CM

## 2020-05-21 DIAGNOSIS — I130 Hypertensive heart and chronic kidney disease with heart failure and stage 1 through stage 4 chronic kidney disease, or unspecified chronic kidney disease: Secondary | ICD-10-CM | POA: Diagnosis not present

## 2020-05-21 DIAGNOSIS — N182 Chronic kidney disease, stage 2 (mild): Secondary | ICD-10-CM | POA: Diagnosis not present

## 2020-05-21 DIAGNOSIS — I255 Ischemic cardiomyopathy: Secondary | ICD-10-CM | POA: Diagnosis not present

## 2020-05-21 DIAGNOSIS — K219 Gastro-esophageal reflux disease without esophagitis: Secondary | ICD-10-CM | POA: Insufficient documentation

## 2020-05-21 DIAGNOSIS — I340 Nonrheumatic mitral (valve) insufficiency: Secondary | ICD-10-CM | POA: Insufficient documentation

## 2020-05-21 DIAGNOSIS — I252 Old myocardial infarction: Secondary | ICD-10-CM | POA: Diagnosis not present

## 2020-05-21 DIAGNOSIS — E785 Hyperlipidemia, unspecified: Secondary | ICD-10-CM | POA: Insufficient documentation

## 2020-05-21 DIAGNOSIS — Z91041 Radiographic dye allergy status: Secondary | ICD-10-CM | POA: Diagnosis not present

## 2020-05-21 DIAGNOSIS — Z888 Allergy status to other drugs, medicaments and biological substances status: Secondary | ICD-10-CM | POA: Diagnosis not present

## 2020-05-21 DIAGNOSIS — Z8774 Personal history of (corrected) congenital malformations of heart and circulatory system: Secondary | ICD-10-CM | POA: Insufficient documentation

## 2020-05-21 DIAGNOSIS — J45909 Unspecified asthma, uncomplicated: Secondary | ICD-10-CM | POA: Insufficient documentation

## 2020-05-21 NOTE — Progress Notes (Signed)
Advanced Heart Failure Clinic Note   Patient ID: Chad [name]., male   DOB: 12-Aug-1961, 59 y.o.   MRN: [id number]   Primary Cardiologist:  Dr. Glori Bickers   History of Present Illness: Chad [name] is a 59 y.o. male with history of severe HTN, coronary artery disease status post previous myocardial infarction and bypass surgery in 2006 also DES to native PDA in 2011.  He also has a history congestive heart failure secondary to ischemic cardiomyopathy EF 20-25%   He is s/p single chamber [employer] ICD.  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 Also with significant low back pain s/p 2 surgeries.   He has had episodes of CP but have avoided cath due to previous dissection and solitary kidney.  Myoview 11/2015  1. Evidence for very mild reversibility and ischemia involving the anterolateral wall and a portion of the lateral wall. 2. Large infarct involving the inferior wall. There is dyskinesia in the inferior wall. 3. Left ventricular ejection fraction is 32%. Left ventricle dilatation.  In 11/17 admitted for ICD shocks due to fractured RV lead. Underwent lead extraction and replacement.   We saw him in 2/18 for acute visit due to increasing dyspnea and volume overload. ECHO EF 20-25% moderate RV dysfunction. Moderate AI and severe MR. Ao Root 35mm-> 46mm. In 4/18, he saw Dr. 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 He was referred to [facility] team for further evaluation in 6/18 and felt to be too early for transplant based on  CPX.  Admitted 5/6-05/01/18 with chest pain. Troponins negative and EKG was unchanged. Chest pain thought to be related to HTN and possibly anxiety. He declined celexa. He was instructed to take extra 50 mg hydralazine for SBP >140 and 1 mg cardura for SBP >160.  Repeat CPX in 1/21  Peak VO2: 18.7 -> 15.8  (48% predicted peak VO2) VE/VCO2 slope: stable at34  Echo 12/31/19: EF 15-20% RV moderately down. Severe MR/moderate TR  RHC 01/19/20 RA = 1 RV = 32/1 PA = 32/11 (21) PCW = 9 Fick cardiac output/index = 4.0/2.1 PVR = 3.0 WU Ao sat = 98% PA sat = 65%, 65%  Admitted in 5/21 with low output HF and AKI. Required milrinone. Transferred to [facility] for consideration of high-risk VAD. Discharged from [place] on milrinone 0.375. Feeling better. Breathing a lot better but still SOB and weak with minimal exertion. Plan for VAD implant next week. Denies edema, orthopnea or PND.   Cardiac studies:  CPX 01/05/20 FVC 2.70 (65%)    FEV1 2.09 (64%)     FEV1/FVC 77 (98%)     MVV 105 (71%)     Resting HR: 73 Standing HR: 71 Peak HR: 112  (69% age predicted max HR)  BP rest: 104/68 Standing BP: 92/64 BP peak: 108/60  Peak VO2: 15.8 (48% predicted peak VO2)  VE/VCO2 slope: 34  OUES: 1.40  Peak RER: 0.96  Ventilatory Threshold: 12.9 (39% predicted or measured peak VO2)  VE/MVV: 33%  O2pulse: 9  (60% predicted O2pulse)    CPX 05/15/18 FVC 2.86 (67%)  FEV1 2.26 (68%)     FEV1/FVC 79 (100%)     MVV 106 (70%)  Resting HR: 71 Peak HR: 110  (67% age predicted max HR) BP rest: 100/64 BP peak: 118/66 Peak VO2: 18.7 (56% predicted peak VO2) VE/VCO2 slope: 34 OUES: 1.45 Peak RER: 1.04 Ventilatory Threshold: 11.8 (35% predicted and 63% measured peak VO2) VE/MVV: 43% O2pulse: 12  (80% predicted O2pulse)  CPX 4/18  FVC 3.11 (73%)    FEV1 2.40 (71%)     FEV1/FVC 77 (97%)     MVV 117 (77%) Resting HR: 75 Peak HR: 134  (81% age predicted max HR) BP rest: 120/68 BP  peak: 146/82 Peak VO2: 18.6 (55% predicted peak VO2) VE/VCO2 slope: 31 OUES: 2.01 Peak RER: 1.05 Ventilatory Threshold: 16.0 (47% predicted or measured peak VO2) VE/MVV: 49% O2pulse: 13  (76% predicted O2pulse)  04/18/2018 20-25%.  3/18: ECHO EF 20-25% Moderate AI, moderate to severe MR, moderate TR. Ao Root 50cm 10/22/2015: ECHO EF 20-25% 11/26/12 ECHO EF 35-40%  Review of systems complete and found to be negative unless listed in HPI.   Past Medical History:  Diagnosis Date  . AICD (automatic cardioverter/defibrillator) present   . Anemia   . Anginal pain ([place])   . Anxiety   . Aortic dissection, thoracoabdominal ([place])    7/10: Type I s/p repair  . Arthritis   . Asthma   . CAD (coronary artery disease)    a. s/p CABG 2006;  b. DES to PDA 2011 (cath: Dx not seen, dRCA/PDA tx with DES; S-PDA occluded (culprit), S-Dx occluded, S-RI and OM ok, L-LAD ok  . Carotid stenosis    dopplers 2011: 0-39% bilat.  . Chest pain syndrome   . CHF (congestive heart failure) ([place])   . Chronic bronchitis ([place])   . Chronic lower back pain   . Chronic systolic heart failure (HCC)    a. 12/13 ECHO: EF 35-40%, sept, apical & posterobasal HK, LV mod dil & sys fx mod reduced, mild AI, MV mild reg, TV mild reg  . Complication of anesthesia    "difficult to wake afterwards a couple of times" and patient states he has woken during surgery   . 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 Dyspnea    patient denies at 12/06/2018 appt   . Family history of adverse reaction to anesthesia    "sister hard to wake up"  . GERD (gastroesophageal reflux disease)   . Gout   . Heart murmur   . HLD (hyperlipidemia)   . HTN (hypertension)    severe  . Myocardial infarction [facility])    "many" (11/07/2016)  . PONV (postoperative nausea and vomiting)   . Sleep apnea    does not use mask   . Thyroid cancer ([place])    Hertle Cell    Current Outpatient  Medications  Medication Sig Dispense Refill  . acetaminophen (TYLENOL) 325 MG tablet Take 2 tablets (650 mg total) by mouth every 4 (four) hours as needed for headache or mild pain.    [name] alum & mag hydroxide-simeth (MAALOX/MYLANTA) 200-200-20 MG/5ML suspension Take 15 mLs by mouth every 6 (six) hours as needed for indigestion or heartburn. 355 mL 0  . aspirin EC 81 MG tablet Take 81 mg by mouth every evening.    . fluticasone (FLONASE) 50 MCG/ACT nasal spray Place 2 sprays into both nostrils daily as needed for allergies or rhinitis.     [name]  heparin 5000 UNIT/ML injection Inject 1 mL (5,000 Units total) into the skin every 8 (eight) hours. 1 mL   . milrinone (PRIMACOR) 20 MG/100 ML SOLN infusion Inject 0.0265 mg/min into the vein continuous.    . Oxycodone HCl 20 MG TABS Take 20 mg by mouth every 6 (six) hours as needed (For pain).     . potassium chloride (KLOR-CON) 20 MEQ packet Take 20 mEq by mouth as directed.    . torsemide (DEMADEX) 20 MG tablet Take 20 mg by mouth daily.     No current facility-administered medications for this encounter.    Allergies  Allergen Reactions  . Contrast Media [Iodinated Diagnostic Agents] Anaphylaxis  . Iohexol Anaphylaxis and Other (See Comments)    PT HAS ANAPHYLAXIS WITH CONTRAST MEDIA!  . Lipitor [Atorvastatin Calcium] Anaphylaxis and Other (See Comments)    Large doses  . Shellfish Allergy Anaphylaxis  . Sulfa Antibiotics Shortness Of Breath and Swelling  . Sulfonamide Derivatives Shortness Of Breath and Swelling  . Almond Oil Itching    FACIAL/MOUTH ITCHING  . Metrizamide Swelling    SWELLING REACTION UNSPECIFIED   . Zocor [Simvastatin] Other (See Comments)    Muscle cramps  . Food Itching    Shellfish, peaches, almonds, apples & kiwis  . Latex Rash and Other (See Comments)    With long periods of exposure  . Peach [Prunus Persica] Itching    Vital Signs: Vitals:   05/21/20 1506  BP: 110/76  Pulse: 100  SpO2: 97%  Weight: 71.4 kg  (157 lb 8 oz)   Filed Weights   05/21/20 1506  Weight: 71.4 kg (157 lb 8 oz)    Wt Readings from Last 3 Encounters:  05/21/20 71.4 kg (157 lb 8 oz)  05/03/20 72.3 kg (159 lb 6.3 oz)  04/29/20 70.9 kg (156 lb 3.2 oz)   PHYSICAL EXAM: General:  Thin. Fatigued appearing. No resp difficulty HEENT: normal Neck: supple. JVP 9-10 with prominent v waves Carotids 2+ bilat; no bruits. No lymphadenopathy or thryomegaly appreciated. Cor: PMI nondisplaced. Regular rate & rhythm. 2/6 SEM RUSB. 3/6 MR + PICC Abdomen: soft, nontender, nondistended. No hepatosplenomegaly. No bruits or masses. Good bowel sounds. Extremities: no cyanosis, clubbing, rash, edema Neuro: alert & orientedx3, cranial nerves grossly intact. moves all 4 extremities w/o difficulty. Affect pleasant   ASSESSMENT/PLAN:  1. Chronic systolic HF: Ischemic cardiomyopathy.  Echo (2/16) with EF 25%. S/p [employer] ICD. Echo 3/18. EF 20-25%. Echo 04/18/18: EF 20-25%, grade 2 DD\ - ECHO  12/31/19 showed EF 15- 20% RV moderately down, severe MR - CPX 01/05/20 with pVO2: 15.8 (48% predicted peak VO2), slope 34 RER 0.96 Standing BP: 92/64 BP peak: 108/60  VE/VCO2 slope: 34  - RHC 1/21 reassuring hemodynamics - He has seen w Dr. Prescott Gum in the past and given previous dissection and aortic root replacement likely not a candidate for VAD here. - Admitted 5/21 with low-output HF/shock. Transferred to [place] - Now on home milrinone. Awaiting high-risk VAD next week.  - Remains NYHA IIIB-IV. Volume status likely bout as good as we can get it.  - Long discussion about VAD surgery and the risks for him with his comorbidities. He understands and is willing to proceed as he realizes there are no other good options at this point  2. CAD s/p CABG 2006:  - Cardiolite 11/2015 with evidence for mild reversibility involving the anterolateral wall and a portion of the lateral wall. - We have avoided left heart cath  due to dissection and CKD with  solitary kidney (lost kidney due to dissection) - No current s/s of ischemia - Continue ASA 81 mg daily. Off Plavix for VAD surgery  - Statin intolerance at any dose. Also unable to tolerate Zetia - has been referred to Lipid Clinic for possible PCSK-9   3. CKD in setting of solitary kidney s/p dissection - stable on recent labs  4. HTN:  - Blood pressure well controlled. Continue current regimen.  5. H/o Type I aortic dissection: Follows with CVTS.  - He has seen w Dr. Prescott Gum. Given previous dissection and aortic root replacement likely not candidate for VAD. - Echo 04/18/18: aortic root, ascending aorta, and aortic arch all mildly dilated - repeat ECHO 12/2019: Ao Root diam: 5.10 cm, Ao Asc diam: 4.30 cm  6. NSVT - in the setting of severe CM - continue Coreg - labs today  7. MR  - Severe on ECHO - Pending VAD  Total time spent 35 minutes. Over half that time spent discussing above.    Glori Bickers, MD 3:13 PM

## 2020-05-26 DIAGNOSIS — Z79899 Other long term (current) drug therapy: Secondary | ICD-10-CM | POA: Diagnosis not present

## 2020-06-02 DIAGNOSIS — I252 Old myocardial infarction: Secondary | ICD-10-CM | POA: Diagnosis not present

## 2020-06-02 DIAGNOSIS — I340 Nonrheumatic mitral (valve) insufficiency: Secondary | ICD-10-CM | POA: Diagnosis not present

## 2020-06-02 DIAGNOSIS — I712 Thoracic aortic aneurysm, without rupture: Secondary | ICD-10-CM | POA: Diagnosis not present

## 2020-06-02 DIAGNOSIS — I5023 Acute on chronic systolic (congestive) heart failure: Secondary | ICD-10-CM | POA: Diagnosis not present

## 2020-06-03 DIAGNOSIS — I351 Nonrheumatic aortic (valve) insufficiency: Secondary | ICD-10-CM | POA: Diagnosis not present

## 2020-06-03 DIAGNOSIS — Z95811 Presence of heart assist device: Secondary | ICD-10-CM | POA: Diagnosis not present

## 2020-06-03 DIAGNOSIS — I252 Old myocardial infarction: Secondary | ICD-10-CM | POA: Diagnosis not present

## 2020-06-03 DIAGNOSIS — I97618 Postprocedural hemorrhage and hematoma of a circulatory system organ or structure following other circulatory system procedure: Secondary | ICD-10-CM | POA: Diagnosis not present

## 2020-06-03 DIAGNOSIS — I712 Thoracic aortic aneurysm, without rupture: Secondary | ICD-10-CM | POA: Diagnosis not present

## 2020-06-03 DIAGNOSIS — I7100 Dissection of unspecified site of aorta: Secondary | ICD-10-CM | POA: Diagnosis not present

## 2020-06-03 DIAGNOSIS — Z95810 Presence of automatic (implantable) cardiac defibrillator: Secondary | ICD-10-CM | POA: Diagnosis not present

## 2020-06-03 DIAGNOSIS — Z951 Presence of aortocoronary bypass graft: Secondary | ICD-10-CM | POA: Diagnosis not present

## 2020-06-03 DIAGNOSIS — I7101 Dissection of thoracic aorta: Secondary | ICD-10-CM | POA: Diagnosis not present

## 2020-06-03 DIAGNOSIS — I5020 Unspecified systolic (congestive) heart failure: Secondary | ICD-10-CM | POA: Diagnosis not present

## 2020-06-03 DIAGNOSIS — T8132XA Disruption of internal operation (surgical) wound, not elsewhere classified, initial encounter: Secondary | ICD-10-CM | POA: Diagnosis not present

## 2020-06-03 DIAGNOSIS — I871 Compression of vein: Secondary | ICD-10-CM | POA: Diagnosis not present

## 2020-06-03 DIAGNOSIS — I7411 Embolism and thrombosis of thoracic aorta: Secondary | ICD-10-CM | POA: Diagnosis not present

## 2020-06-03 DIAGNOSIS — Z79899 Other long term (current) drug therapy: Secondary | ICD-10-CM | POA: Diagnosis not present

## 2020-06-03 DIAGNOSIS — I5023 Acute on chronic systolic (congestive) heart failure: Secondary | ICD-10-CM | POA: Diagnosis not present

## 2020-06-03 DIAGNOSIS — Z9889 Other specified postprocedural states: Secondary | ICD-10-CM | POA: Diagnosis not present

## 2020-06-04 DIAGNOSIS — I7100 Dissection of unspecified site of aorta: Secondary | ICD-10-CM | POA: Diagnosis not present

## 2020-06-04 DIAGNOSIS — I351 Nonrheumatic aortic (valve) insufficiency: Secondary | ICD-10-CM | POA: Diagnosis not present

## 2020-06-04 DIAGNOSIS — I7101 Dissection of thoracic aorta: Secondary | ICD-10-CM | POA: Diagnosis not present

## 2020-06-04 DIAGNOSIS — Z951 Presence of aortocoronary bypass graft: Secondary | ICD-10-CM | POA: Diagnosis not present

## 2020-06-04 DIAGNOSIS — I712 Thoracic aortic aneurysm, without rupture: Secondary | ICD-10-CM | POA: Diagnosis not present

## 2020-06-04 DIAGNOSIS — I7103 Dissection of thoracoabdominal aorta: Secondary | ICD-10-CM | POA: Diagnosis not present

## 2020-06-04 DIAGNOSIS — R570 Cardiogenic shock: Secondary | ICD-10-CM | POA: Diagnosis not present

## 2020-06-04 DIAGNOSIS — R0989 Other specified symptoms and signs involving the circulatory and respiratory systems: Secondary | ICD-10-CM | POA: Diagnosis not present

## 2020-06-04 DIAGNOSIS — T8132XA Disruption of internal operation (surgical) wound, not elsewhere classified, initial encounter: Secondary | ICD-10-CM | POA: Diagnosis not present

## 2020-06-04 DIAGNOSIS — I97618 Postprocedural hemorrhage and hematoma of a circulatory system organ or structure following other circulatory system procedure: Secondary | ICD-10-CM | POA: Diagnosis not present

## 2020-06-04 DIAGNOSIS — I7411 Embolism and thrombosis of thoracic aorta: Secondary | ICD-10-CM | POA: Diagnosis not present

## 2020-06-04 DIAGNOSIS — Z95811 Presence of heart assist device: Secondary | ICD-10-CM | POA: Diagnosis not present

## 2020-06-04 DIAGNOSIS — I5023 Acute on chronic systolic (congestive) heart failure: Secondary | ICD-10-CM | POA: Diagnosis not present

## 2020-06-04 DIAGNOSIS — N182 Chronic kidney disease, stage 2 (mild): Secondary | ICD-10-CM | POA: Diagnosis not present

## 2020-06-04 DIAGNOSIS — I871 Compression of vein: Secondary | ICD-10-CM | POA: Diagnosis not present

## 2020-06-04 DIAGNOSIS — I10 Essential (primary) hypertension: Secondary | ICD-10-CM | POA: Diagnosis not present

## 2020-06-04 DIAGNOSIS — Z9889 Other specified postprocedural states: Secondary | ICD-10-CM | POA: Diagnosis not present

## 2020-06-05 DIAGNOSIS — N182 Chronic kidney disease, stage 2 (mild): Secondary | ICD-10-CM | POA: Diagnosis not present

## 2020-06-05 DIAGNOSIS — R570 Cardiogenic shock: Secondary | ICD-10-CM | POA: Diagnosis not present

## 2020-06-05 DIAGNOSIS — G8918 Other acute postprocedural pain: Secondary | ICD-10-CM | POA: Diagnosis not present

## 2020-06-05 DIAGNOSIS — I252 Old myocardial infarction: Secondary | ICD-10-CM | POA: Diagnosis not present

## 2020-06-05 DIAGNOSIS — R0989 Other specified symptoms and signs involving the circulatory and respiratory systems: Secondary | ICD-10-CM | POA: Diagnosis not present

## 2020-06-05 DIAGNOSIS — I340 Nonrheumatic mitral (valve) insufficiency: Secondary | ICD-10-CM | POA: Diagnosis not present

## 2020-06-05 DIAGNOSIS — I5023 Acute on chronic systolic (congestive) heart failure: Secondary | ICD-10-CM | POA: Diagnosis not present

## 2020-06-05 DIAGNOSIS — Z95811 Presence of heart assist device: Secondary | ICD-10-CM | POA: Diagnosis not present

## 2020-06-05 DIAGNOSIS — Z79899 Other long term (current) drug therapy: Secondary | ICD-10-CM | POA: Diagnosis not present

## 2020-06-05 DIAGNOSIS — R918 Other nonspecific abnormal finding of lung field: Secondary | ICD-10-CM | POA: Diagnosis not present

## 2020-06-06 DIAGNOSIS — I5023 Acute on chronic systolic (congestive) heart failure: Secondary | ICD-10-CM | POA: Diagnosis not present

## 2020-06-06 DIAGNOSIS — N182 Chronic kidney disease, stage 2 (mild): Secondary | ICD-10-CM | POA: Diagnosis not present

## 2020-06-06 DIAGNOSIS — G8918 Other acute postprocedural pain: Secondary | ICD-10-CM | POA: Diagnosis not present

## 2020-06-06 DIAGNOSIS — R570 Cardiogenic shock: Secondary | ICD-10-CM | POA: Diagnosis not present

## 2020-06-06 DIAGNOSIS — Z95811 Presence of heart assist device: Secondary | ICD-10-CM | POA: Diagnosis not present

## 2020-06-06 DIAGNOSIS — R0989 Other specified symptoms and signs involving the circulatory and respiratory systems: Secondary | ICD-10-CM | POA: Diagnosis not present

## 2020-06-07 DIAGNOSIS — Z95811 Presence of heart assist device: Secondary | ICD-10-CM | POA: Diagnosis not present

## 2020-06-07 DIAGNOSIS — R918 Other nonspecific abnormal finding of lung field: Secondary | ICD-10-CM | POA: Diagnosis not present

## 2020-06-07 DIAGNOSIS — I252 Old myocardial infarction: Secondary | ICD-10-CM | POA: Diagnosis not present

## 2020-06-07 DIAGNOSIS — G8918 Other acute postprocedural pain: Secondary | ICD-10-CM | POA: Diagnosis not present

## 2020-06-07 DIAGNOSIS — R1312 Dysphagia, oropharyngeal phase: Secondary | ICD-10-CM | POA: Diagnosis not present

## 2020-06-07 DIAGNOSIS — Z4502 Encounter for adjustment and management of automatic implantable cardiac defibrillator: Secondary | ICD-10-CM | POA: Diagnosis not present

## 2020-06-07 DIAGNOSIS — I340 Nonrheumatic mitral (valve) insufficiency: Secondary | ICD-10-CM | POA: Diagnosis not present

## 2020-06-07 DIAGNOSIS — I5023 Acute on chronic systolic (congestive) heart failure: Secondary | ICD-10-CM | POA: Diagnosis not present

## 2020-06-08 DIAGNOSIS — I5023 Acute on chronic systolic (congestive) heart failure: Secondary | ICD-10-CM | POA: Diagnosis not present

## 2020-06-08 DIAGNOSIS — G8918 Other acute postprocedural pain: Secondary | ICD-10-CM | POA: Diagnosis not present

## 2020-06-08 DIAGNOSIS — R1312 Dysphagia, oropharyngeal phase: Secondary | ICD-10-CM | POA: Diagnosis not present

## 2020-06-08 DIAGNOSIS — R918 Other nonspecific abnormal finding of lung field: Secondary | ICD-10-CM | POA: Diagnosis not present

## 2020-06-09 DIAGNOSIS — R1312 Dysphagia, oropharyngeal phase: Secondary | ICD-10-CM | POA: Diagnosis not present

## 2020-06-09 DIAGNOSIS — I255 Ischemic cardiomyopathy: Secondary | ICD-10-CM | POA: Diagnosis not present

## 2020-06-09 DIAGNOSIS — R918 Other nonspecific abnormal finding of lung field: Secondary | ICD-10-CM | POA: Diagnosis not present

## 2020-06-09 DIAGNOSIS — I5023 Acute on chronic systolic (congestive) heart failure: Secondary | ICD-10-CM | POA: Diagnosis not present

## 2020-06-09 DIAGNOSIS — Z95811 Presence of heart assist device: Secondary | ICD-10-CM | POA: Diagnosis not present

## 2020-06-09 DIAGNOSIS — I252 Old myocardial infarction: Secondary | ICD-10-CM | POA: Diagnosis not present

## 2020-06-09 DIAGNOSIS — G8918 Other acute postprocedural pain: Secondary | ICD-10-CM | POA: Diagnosis not present

## 2020-06-10 DIAGNOSIS — I351 Nonrheumatic aortic (valve) insufficiency: Secondary | ICD-10-CM | POA: Diagnosis not present

## 2020-06-10 DIAGNOSIS — I340 Nonrheumatic mitral (valve) insufficiency: Secondary | ICD-10-CM | POA: Diagnosis not present

## 2020-06-10 DIAGNOSIS — I517 Cardiomegaly: Secondary | ICD-10-CM | POA: Diagnosis not present

## 2020-06-10 DIAGNOSIS — R1312 Dysphagia, oropharyngeal phase: Secondary | ICD-10-CM | POA: Diagnosis not present

## 2020-06-10 DIAGNOSIS — I361 Nonrheumatic tricuspid (valve) insufficiency: Secondary | ICD-10-CM | POA: Diagnosis not present

## 2020-06-10 DIAGNOSIS — I252 Old myocardial infarction: Secondary | ICD-10-CM | POA: Diagnosis not present

## 2020-06-10 DIAGNOSIS — G8918 Other acute postprocedural pain: Secondary | ICD-10-CM | POA: Diagnosis not present

## 2020-06-10 DIAGNOSIS — I5023 Acute on chronic systolic (congestive) heart failure: Secondary | ICD-10-CM | POA: Diagnosis not present

## 2020-06-10 DIAGNOSIS — Z95811 Presence of heart assist device: Secondary | ICD-10-CM | POA: Diagnosis not present

## 2020-06-11 DIAGNOSIS — R1312 Dysphagia, oropharyngeal phase: Secondary | ICD-10-CM | POA: Diagnosis not present

## 2020-06-11 DIAGNOSIS — I712 Thoracic aortic aneurysm, without rupture: Secondary | ICD-10-CM | POA: Diagnosis not present

## 2020-06-11 DIAGNOSIS — G8918 Other acute postprocedural pain: Secondary | ICD-10-CM | POA: Diagnosis not present

## 2020-06-11 DIAGNOSIS — I252 Old myocardial infarction: Secondary | ICD-10-CM | POA: Diagnosis not present

## 2020-06-11 DIAGNOSIS — J9811 Atelectasis: Secondary | ICD-10-CM | POA: Diagnosis not present

## 2020-06-11 DIAGNOSIS — Z95811 Presence of heart assist device: Secondary | ICD-10-CM | POA: Diagnosis not present

## 2020-06-11 DIAGNOSIS — I5023 Acute on chronic systolic (congestive) heart failure: Secondary | ICD-10-CM | POA: Diagnosis not present

## 2020-06-12 DIAGNOSIS — I252 Old myocardial infarction: Secondary | ICD-10-CM | POA: Diagnosis not present

## 2020-06-12 DIAGNOSIS — I340 Nonrheumatic mitral (valve) insufficiency: Secondary | ICD-10-CM | POA: Diagnosis not present

## 2020-06-12 DIAGNOSIS — I5023 Acute on chronic systolic (congestive) heart failure: Secondary | ICD-10-CM | POA: Diagnosis not present

## 2020-06-12 DIAGNOSIS — J9383 Other pneumothorax: Secondary | ICD-10-CM | POA: Diagnosis not present

## 2020-06-12 DIAGNOSIS — G8918 Other acute postprocedural pain: Secondary | ICD-10-CM | POA: Diagnosis not present

## 2020-06-13 DIAGNOSIS — G8918 Other acute postprocedural pain: Secondary | ICD-10-CM | POA: Diagnosis not present

## 2020-06-13 DIAGNOSIS — Z951 Presence of aortocoronary bypass graft: Secondary | ICD-10-CM | POA: Diagnosis not present

## 2020-06-13 DIAGNOSIS — I255 Ischemic cardiomyopathy: Secondary | ICD-10-CM | POA: Diagnosis not present

## 2020-06-13 DIAGNOSIS — I5023 Acute on chronic systolic (congestive) heart failure: Secondary | ICD-10-CM | POA: Diagnosis not present

## 2020-06-13 DIAGNOSIS — Z9889 Other specified postprocedural states: Secondary | ICD-10-CM | POA: Diagnosis not present

## 2020-06-14 DIAGNOSIS — I97618 Postprocedural hemorrhage and hematoma of a circulatory system organ or structure following other circulatory system procedure: Secondary | ICD-10-CM | POA: Diagnosis not present

## 2020-06-14 DIAGNOSIS — I7101 Dissection of thoracic aorta: Secondary | ICD-10-CM | POA: Diagnosis not present

## 2020-06-14 DIAGNOSIS — I712 Thoracic aortic aneurysm, without rupture: Secondary | ICD-10-CM | POA: Diagnosis not present

## 2020-06-14 DIAGNOSIS — I5023 Acute on chronic systolic (congestive) heart failure: Secondary | ICD-10-CM | POA: Diagnosis not present

## 2020-06-14 DIAGNOSIS — I871 Compression of vein: Secondary | ICD-10-CM | POA: Diagnosis not present

## 2020-06-14 DIAGNOSIS — Z95811 Presence of heart assist device: Secondary | ICD-10-CM | POA: Diagnosis not present

## 2020-06-14 DIAGNOSIS — R1312 Dysphagia, oropharyngeal phase: Secondary | ICD-10-CM | POA: Diagnosis not present

## 2020-06-14 DIAGNOSIS — I7411 Embolism and thrombosis of thoracic aorta: Secondary | ICD-10-CM | POA: Diagnosis not present

## 2020-06-14 DIAGNOSIS — Z79899 Other long term (current) drug therapy: Secondary | ICD-10-CM | POA: Diagnosis not present

## 2020-06-14 DIAGNOSIS — T8132XA Disruption of internal operation (surgical) wound, not elsewhere classified, initial encounter: Secondary | ICD-10-CM | POA: Diagnosis not present

## 2020-06-14 DIAGNOSIS — I351 Nonrheumatic aortic (valve) insufficiency: Secondary | ICD-10-CM | POA: Diagnosis not present

## 2020-06-14 DIAGNOSIS — Z4682 Encounter for fitting and adjustment of non-vascular catheter: Secondary | ICD-10-CM | POA: Diagnosis not present

## 2020-06-14 DIAGNOSIS — Z9889 Other specified postprocedural states: Secondary | ICD-10-CM | POA: Diagnosis not present

## 2020-06-14 DIAGNOSIS — Z951 Presence of aortocoronary bypass graft: Secondary | ICD-10-CM | POA: Diagnosis not present

## 2020-06-14 DIAGNOSIS — J9811 Atelectasis: Secondary | ICD-10-CM | POA: Diagnosis not present

## 2020-06-14 DIAGNOSIS — I5020 Unspecified systolic (congestive) heart failure: Secondary | ICD-10-CM | POA: Diagnosis not present

## 2020-06-14 DIAGNOSIS — Z452 Encounter for adjustment and management of vascular access device: Secondary | ICD-10-CM | POA: Diagnosis not present

## 2020-06-15 DIAGNOSIS — G8918 Other acute postprocedural pain: Secondary | ICD-10-CM | POA: Diagnosis not present

## 2020-06-15 DIAGNOSIS — I5023 Acute on chronic systolic (congestive) heart failure: Secondary | ICD-10-CM | POA: Diagnosis not present

## 2020-06-15 DIAGNOSIS — R1312 Dysphagia, oropharyngeal phase: Secondary | ICD-10-CM | POA: Diagnosis not present

## 2020-06-15 DIAGNOSIS — J9811 Atelectasis: Secondary | ICD-10-CM | POA: Diagnosis not present

## 2020-06-15 DIAGNOSIS — Z95811 Presence of heart assist device: Secondary | ICD-10-CM | POA: Diagnosis not present

## 2020-06-15 DIAGNOSIS — R918 Other nonspecific abnormal finding of lung field: Secondary | ICD-10-CM | POA: Diagnosis not present

## 2020-06-16 DIAGNOSIS — R918 Other nonspecific abnormal finding of lung field: Secondary | ICD-10-CM | POA: Diagnosis not present

## 2020-06-16 DIAGNOSIS — I5023 Acute on chronic systolic (congestive) heart failure: Secondary | ICD-10-CM | POA: Diagnosis not present

## 2020-06-16 DIAGNOSIS — G8918 Other acute postprocedural pain: Secondary | ICD-10-CM | POA: Diagnosis not present

## 2020-06-16 DIAGNOSIS — Z95811 Presence of heart assist device: Secondary | ICD-10-CM | POA: Diagnosis not present

## 2020-06-16 DIAGNOSIS — R1312 Dysphagia, oropharyngeal phase: Secondary | ICD-10-CM | POA: Diagnosis not present

## 2020-06-16 DIAGNOSIS — J9811 Atelectasis: Secondary | ICD-10-CM | POA: Diagnosis not present

## 2020-06-17 DIAGNOSIS — I5023 Acute on chronic systolic (congestive) heart failure: Secondary | ICD-10-CM | POA: Diagnosis not present

## 2020-06-17 DIAGNOSIS — G8918 Other acute postprocedural pain: Secondary | ICD-10-CM | POA: Diagnosis not present

## 2020-06-17 DIAGNOSIS — Z95811 Presence of heart assist device: Secondary | ICD-10-CM | POA: Diagnosis not present

## 2020-06-18 DIAGNOSIS — Z952 Presence of prosthetic heart valve: Secondary | ICD-10-CM | POA: Diagnosis not present

## 2020-06-18 DIAGNOSIS — R1312 Dysphagia, oropharyngeal phase: Secondary | ICD-10-CM | POA: Diagnosis not present

## 2020-06-18 DIAGNOSIS — J90 Pleural effusion, not elsewhere classified: Secondary | ICD-10-CM | POA: Diagnosis not present

## 2020-06-18 DIAGNOSIS — I340 Nonrheumatic mitral (valve) insufficiency: Secondary | ICD-10-CM | POA: Diagnosis not present

## 2020-06-18 DIAGNOSIS — G8918 Other acute postprocedural pain: Secondary | ICD-10-CM | POA: Diagnosis not present

## 2020-06-18 DIAGNOSIS — I252 Old myocardial infarction: Secondary | ICD-10-CM | POA: Diagnosis not present

## 2020-06-18 DIAGNOSIS — Z95811 Presence of heart assist device: Secondary | ICD-10-CM | POA: Diagnosis not present

## 2020-06-19 DIAGNOSIS — G8918 Other acute postprocedural pain: Secondary | ICD-10-CM | POA: Diagnosis not present

## 2020-06-19 DIAGNOSIS — I252 Old myocardial infarction: Secondary | ICD-10-CM | POA: Diagnosis not present

## 2020-06-19 DIAGNOSIS — Z95811 Presence of heart assist device: Secondary | ICD-10-CM | POA: Diagnosis not present

## 2020-06-19 DIAGNOSIS — I25810 Atherosclerosis of coronary artery bypass graft(s) without angina pectoris: Secondary | ICD-10-CM | POA: Diagnosis not present

## 2020-06-19 DIAGNOSIS — I340 Nonrheumatic mitral (valve) insufficiency: Secondary | ICD-10-CM | POA: Diagnosis not present

## 2020-06-19 DIAGNOSIS — Z952 Presence of prosthetic heart valve: Secondary | ICD-10-CM | POA: Diagnosis not present

## 2020-06-19 DIAGNOSIS — R1312 Dysphagia, oropharyngeal phase: Secondary | ICD-10-CM | POA: Diagnosis not present

## 2020-06-20 DIAGNOSIS — G8918 Other acute postprocedural pain: Secondary | ICD-10-CM | POA: Diagnosis not present

## 2020-06-20 DIAGNOSIS — Z95811 Presence of heart assist device: Secondary | ICD-10-CM | POA: Diagnosis not present

## 2020-06-21 DIAGNOSIS — J9811 Atelectasis: Secondary | ICD-10-CM | POA: Diagnosis not present

## 2020-06-21 DIAGNOSIS — R1312 Dysphagia, oropharyngeal phase: Secondary | ICD-10-CM | POA: Diagnosis not present

## 2020-06-21 DIAGNOSIS — J90 Pleural effusion, not elsewhere classified: Secondary | ICD-10-CM | POA: Diagnosis not present

## 2020-06-21 DIAGNOSIS — I25810 Atherosclerosis of coronary artery bypass graft(s) without angina pectoris: Secondary | ICD-10-CM | POA: Diagnosis not present

## 2020-06-21 DIAGNOSIS — I712 Thoracic aortic aneurysm, without rupture: Secondary | ICD-10-CM | POA: Diagnosis not present

## 2020-06-21 DIAGNOSIS — Z9889 Other specified postprocedural states: Secondary | ICD-10-CM | POA: Diagnosis not present

## 2020-06-21 DIAGNOSIS — G8918 Other acute postprocedural pain: Secondary | ICD-10-CM | POA: Diagnosis not present

## 2020-06-21 DIAGNOSIS — I209 Angina pectoris, unspecified: Secondary | ICD-10-CM | POA: Diagnosis not present

## 2020-06-21 DIAGNOSIS — Z95811 Presence of heart assist device: Secondary | ICD-10-CM | POA: Diagnosis not present

## 2020-06-22 DIAGNOSIS — G8918 Other acute postprocedural pain: Secondary | ICD-10-CM | POA: Diagnosis not present

## 2020-06-22 DIAGNOSIS — Z952 Presence of prosthetic heart valve: Secondary | ICD-10-CM | POA: Diagnosis not present

## 2020-06-22 DIAGNOSIS — I252 Old myocardial infarction: Secondary | ICD-10-CM | POA: Diagnosis not present

## 2020-06-22 DIAGNOSIS — Z95811 Presence of heart assist device: Secondary | ICD-10-CM | POA: Diagnosis not present

## 2020-06-22 DIAGNOSIS — J90 Pleural effusion, not elsewhere classified: Secondary | ICD-10-CM | POA: Diagnosis not present

## 2020-06-22 DIAGNOSIS — J9811 Atelectasis: Secondary | ICD-10-CM | POA: Diagnosis not present

## 2020-06-23 DIAGNOSIS — R1312 Dysphagia, oropharyngeal phase: Secondary | ICD-10-CM | POA: Diagnosis not present

## 2020-06-23 DIAGNOSIS — G8918 Other acute postprocedural pain: Secondary | ICD-10-CM | POA: Diagnosis not present

## 2020-06-24 DIAGNOSIS — J90 Pleural effusion, not elsewhere classified: Secondary | ICD-10-CM | POA: Diagnosis not present

## 2020-06-24 DIAGNOSIS — Z4502 Encounter for adjustment and management of automatic implantable cardiac defibrillator: Secondary | ICD-10-CM | POA: Diagnosis not present

## 2020-06-24 DIAGNOSIS — J9811 Atelectasis: Secondary | ICD-10-CM | POA: Diagnosis not present

## 2020-06-24 DIAGNOSIS — G8918 Other acute postprocedural pain: Secondary | ICD-10-CM | POA: Diagnosis not present

## 2020-06-24 DIAGNOSIS — Z952 Presence of prosthetic heart valve: Secondary | ICD-10-CM | POA: Diagnosis not present

## 2020-06-24 DIAGNOSIS — Z9889 Other specified postprocedural states: Secondary | ICD-10-CM | POA: Diagnosis not present

## 2020-06-24 DIAGNOSIS — R1312 Dysphagia, oropharyngeal phase: Secondary | ICD-10-CM | POA: Diagnosis not present

## 2020-06-24 DIAGNOSIS — I351 Nonrheumatic aortic (valve) insufficiency: Secondary | ICD-10-CM | POA: Diagnosis not present

## 2020-06-25 DIAGNOSIS — Z95811 Presence of heart assist device: Secondary | ICD-10-CM | POA: Diagnosis not present

## 2020-06-25 DIAGNOSIS — G8918 Other acute postprocedural pain: Secondary | ICD-10-CM | POA: Diagnosis not present

## 2020-06-25 DIAGNOSIS — R1312 Dysphagia, oropharyngeal phase: Secondary | ICD-10-CM | POA: Diagnosis not present

## 2020-06-25 DIAGNOSIS — I351 Nonrheumatic aortic (valve) insufficiency: Secondary | ICD-10-CM | POA: Diagnosis not present

## 2020-06-25 DIAGNOSIS — I5023 Acute on chronic systolic (congestive) heart failure: Secondary | ICD-10-CM | POA: Diagnosis not present

## 2020-06-26 DIAGNOSIS — Z95811 Presence of heart assist device: Secondary | ICD-10-CM | POA: Diagnosis not present

## 2020-06-26 DIAGNOSIS — G8918 Other acute postprocedural pain: Secondary | ICD-10-CM | POA: Diagnosis not present

## 2020-06-27 DIAGNOSIS — Z95811 Presence of heart assist device: Secondary | ICD-10-CM | POA: Diagnosis not present

## 2020-06-27 DIAGNOSIS — G8918 Other acute postprocedural pain: Secondary | ICD-10-CM | POA: Diagnosis not present

## 2020-06-28 DIAGNOSIS — Z95811 Presence of heart assist device: Secondary | ICD-10-CM | POA: Diagnosis not present

## 2020-06-28 DIAGNOSIS — G8918 Other acute postprocedural pain: Secondary | ICD-10-CM | POA: Diagnosis not present

## 2020-06-29 DIAGNOSIS — I5023 Acute on chronic systolic (congestive) heart failure: Secondary | ICD-10-CM | POA: Diagnosis not present

## 2020-06-29 DIAGNOSIS — Z95811 Presence of heart assist device: Secondary | ICD-10-CM | POA: Diagnosis not present

## 2020-06-29 DIAGNOSIS — J90 Pleural effusion, not elsewhere classified: Secondary | ICD-10-CM | POA: Diagnosis not present

## 2020-06-29 DIAGNOSIS — I252 Old myocardial infarction: Secondary | ICD-10-CM | POA: Diagnosis not present

## 2020-06-29 DIAGNOSIS — G8918 Other acute postprocedural pain: Secondary | ICD-10-CM | POA: Diagnosis not present

## 2020-06-30 DIAGNOSIS — G8918 Other acute postprocedural pain: Secondary | ICD-10-CM | POA: Diagnosis not present

## 2020-06-30 DIAGNOSIS — I77819 Aortic ectasia, unspecified site: Secondary | ICD-10-CM | POA: Diagnosis not present

## 2020-06-30 DIAGNOSIS — Z95811 Presence of heart assist device: Secondary | ICD-10-CM | POA: Diagnosis not present

## 2020-07-02 DIAGNOSIS — Z952 Presence of prosthetic heart valve: Secondary | ICD-10-CM | POA: Diagnosis not present

## 2020-07-02 DIAGNOSIS — Z48812 Encounter for surgical aftercare following surgery on the circulatory system: Secondary | ICD-10-CM | POA: Diagnosis not present

## 2020-07-02 DIAGNOSIS — I252 Old myocardial infarction: Secondary | ICD-10-CM | POA: Diagnosis not present

## 2020-07-02 DIAGNOSIS — I5030 Unspecified diastolic (congestive) heart failure: Secondary | ICD-10-CM | POA: Diagnosis not present

## 2020-07-02 DIAGNOSIS — I5022 Chronic systolic (congestive) heart failure: Secondary | ICD-10-CM | POA: Diagnosis not present

## 2020-07-02 DIAGNOSIS — N182 Chronic kidney disease, stage 2 (mild): Secondary | ICD-10-CM | POA: Diagnosis not present

## 2020-07-02 DIAGNOSIS — M109 Gout, unspecified: Secondary | ICD-10-CM | POA: Diagnosis not present

## 2020-07-02 DIAGNOSIS — J45909 Unspecified asthma, uncomplicated: Secondary | ICD-10-CM | POA: Diagnosis not present

## 2020-07-02 DIAGNOSIS — I340 Nonrheumatic mitral (valve) insufficiency: Secondary | ICD-10-CM | POA: Diagnosis not present

## 2020-07-02 DIAGNOSIS — Z951 Presence of aortocoronary bypass graft: Secondary | ICD-10-CM | POA: Diagnosis not present

## 2020-07-02 DIAGNOSIS — I255 Ischemic cardiomyopathy: Secondary | ICD-10-CM | POA: Diagnosis not present

## 2020-07-02 DIAGNOSIS — Z7901 Long term (current) use of anticoagulants: Secondary | ICD-10-CM | POA: Diagnosis not present

## 2020-07-02 DIAGNOSIS — I130 Hypertensive heart and chronic kidney disease with heart failure and stage 1 through stage 4 chronic kidney disease, or unspecified chronic kidney disease: Secondary | ICD-10-CM | POA: Diagnosis not present

## 2020-07-02 DIAGNOSIS — K219 Gastro-esophageal reflux disease without esophagitis: Secondary | ICD-10-CM | POA: Diagnosis not present

## 2020-07-02 DIAGNOSIS — I2510 Atherosclerotic heart disease of native coronary artery without angina pectoris: Secondary | ICD-10-CM | POA: Diagnosis not present

## 2020-07-02 DIAGNOSIS — Z95810 Presence of automatic (implantable) cardiac defibrillator: Secondary | ICD-10-CM | POA: Diagnosis not present

## 2020-07-02 DIAGNOSIS — Z7982 Long term (current) use of aspirin: Secondary | ICD-10-CM | POA: Diagnosis not present

## 2020-07-02 DIAGNOSIS — G8929 Other chronic pain: Secondary | ICD-10-CM | POA: Diagnosis not present

## 2020-07-02 DIAGNOSIS — Z9181 History of falling: Secondary | ICD-10-CM | POA: Diagnosis not present

## 2020-07-02 DIAGNOSIS — Z95811 Presence of heart assist device: Secondary | ICD-10-CM | POA: Diagnosis not present

## 2020-07-02 DIAGNOSIS — E785 Hyperlipidemia, unspecified: Secondary | ICD-10-CM | POA: Diagnosis not present

## 2020-07-02 DIAGNOSIS — D631 Anemia in chronic kidney disease: Secondary | ICD-10-CM | POA: Diagnosis not present

## 2020-07-05 DIAGNOSIS — Z7901 Long term (current) use of anticoagulants: Secondary | ICD-10-CM | POA: Diagnosis not present

## 2020-07-05 DIAGNOSIS — J45909 Unspecified asthma, uncomplicated: Secondary | ICD-10-CM | POA: Diagnosis not present

## 2020-07-05 DIAGNOSIS — I252 Old myocardial infarction: Secondary | ICD-10-CM | POA: Diagnosis not present

## 2020-07-05 DIAGNOSIS — I340 Nonrheumatic mitral (valve) insufficiency: Secondary | ICD-10-CM | POA: Diagnosis not present

## 2020-07-05 DIAGNOSIS — Z7982 Long term (current) use of aspirin: Secondary | ICD-10-CM | POA: Diagnosis not present

## 2020-07-05 DIAGNOSIS — Z952 Presence of prosthetic heart valve: Secondary | ICD-10-CM | POA: Diagnosis not present

## 2020-07-05 DIAGNOSIS — I5022 Chronic systolic (congestive) heart failure: Secondary | ICD-10-CM | POA: Diagnosis not present

## 2020-07-05 DIAGNOSIS — I255 Ischemic cardiomyopathy: Secondary | ICD-10-CM | POA: Diagnosis not present

## 2020-07-05 DIAGNOSIS — K219 Gastro-esophageal reflux disease without esophagitis: Secondary | ICD-10-CM | POA: Diagnosis not present

## 2020-07-05 DIAGNOSIS — Z95810 Presence of automatic (implantable) cardiac defibrillator: Secondary | ICD-10-CM | POA: Diagnosis not present

## 2020-07-05 DIAGNOSIS — D631 Anemia in chronic kidney disease: Secondary | ICD-10-CM | POA: Diagnosis not present

## 2020-07-05 DIAGNOSIS — Z9181 History of falling: Secondary | ICD-10-CM | POA: Diagnosis not present

## 2020-07-05 DIAGNOSIS — E785 Hyperlipidemia, unspecified: Secondary | ICD-10-CM | POA: Diagnosis not present

## 2020-07-05 DIAGNOSIS — Z951 Presence of aortocoronary bypass graft: Secondary | ICD-10-CM | POA: Diagnosis not present

## 2020-07-05 DIAGNOSIS — G8929 Other chronic pain: Secondary | ICD-10-CM | POA: Diagnosis not present

## 2020-07-05 DIAGNOSIS — I130 Hypertensive heart and chronic kidney disease with heart failure and stage 1 through stage 4 chronic kidney disease, or unspecified chronic kidney disease: Secondary | ICD-10-CM | POA: Diagnosis not present

## 2020-07-05 DIAGNOSIS — N182 Chronic kidney disease, stage 2 (mild): Secondary | ICD-10-CM | POA: Diagnosis not present

## 2020-07-05 DIAGNOSIS — I2510 Atherosclerotic heart disease of native coronary artery without angina pectoris: Secondary | ICD-10-CM | POA: Diagnosis not present

## 2020-07-05 DIAGNOSIS — M109 Gout, unspecified: Secondary | ICD-10-CM | POA: Diagnosis not present

## 2020-07-05 DIAGNOSIS — Z48812 Encounter for surgical aftercare following surgery on the circulatory system: Secondary | ICD-10-CM | POA: Diagnosis not present

## 2020-07-07 DIAGNOSIS — I5023 Acute on chronic systolic (congestive) heart failure: Secondary | ICD-10-CM | POA: Diagnosis not present

## 2020-07-07 DIAGNOSIS — Z95811 Presence of heart assist device: Secondary | ICD-10-CM | POA: Diagnosis not present

## 2020-07-08 DIAGNOSIS — G8929 Other chronic pain: Secondary | ICD-10-CM | POA: Diagnosis not present

## 2020-07-08 DIAGNOSIS — K219 Gastro-esophageal reflux disease without esophagitis: Secondary | ICD-10-CM | POA: Diagnosis not present

## 2020-07-08 DIAGNOSIS — I5023 Acute on chronic systolic (congestive) heart failure: Secondary | ICD-10-CM | POA: Diagnosis not present

## 2020-07-08 DIAGNOSIS — Z951 Presence of aortocoronary bypass graft: Secondary | ICD-10-CM | POA: Diagnosis not present

## 2020-07-08 DIAGNOSIS — M109 Gout, unspecified: Secondary | ICD-10-CM | POA: Diagnosis not present

## 2020-07-08 DIAGNOSIS — N182 Chronic kidney disease, stage 2 (mild): Secondary | ICD-10-CM | POA: Diagnosis not present

## 2020-07-08 DIAGNOSIS — I2510 Atherosclerotic heart disease of native coronary artery without angina pectoris: Secondary | ICD-10-CM | POA: Diagnosis not present

## 2020-07-08 DIAGNOSIS — Z7901 Long term (current) use of anticoagulants: Secondary | ICD-10-CM | POA: Diagnosis not present

## 2020-07-08 DIAGNOSIS — Z48812 Encounter for surgical aftercare following surgery on the circulatory system: Secondary | ICD-10-CM | POA: Diagnosis not present

## 2020-07-08 DIAGNOSIS — J45909 Unspecified asthma, uncomplicated: Secondary | ICD-10-CM | POA: Diagnosis not present

## 2020-07-08 DIAGNOSIS — Z95810 Presence of automatic (implantable) cardiac defibrillator: Secondary | ICD-10-CM | POA: Diagnosis not present

## 2020-07-08 DIAGNOSIS — Z95811 Presence of heart assist device: Secondary | ICD-10-CM | POA: Diagnosis not present

## 2020-07-08 DIAGNOSIS — I252 Old myocardial infarction: Secondary | ICD-10-CM | POA: Diagnosis not present

## 2020-07-08 DIAGNOSIS — D631 Anemia in chronic kidney disease: Secondary | ICD-10-CM | POA: Diagnosis not present

## 2020-07-08 DIAGNOSIS — I255 Ischemic cardiomyopathy: Secondary | ICD-10-CM | POA: Diagnosis not present

## 2020-07-08 DIAGNOSIS — I130 Hypertensive heart and chronic kidney disease with heart failure and stage 1 through stage 4 chronic kidney disease, or unspecified chronic kidney disease: Secondary | ICD-10-CM | POA: Diagnosis not present

## 2020-07-08 DIAGNOSIS — Z9181 History of falling: Secondary | ICD-10-CM | POA: Diagnosis not present

## 2020-07-08 DIAGNOSIS — Z952 Presence of prosthetic heart valve: Secondary | ICD-10-CM | POA: Diagnosis not present

## 2020-07-08 DIAGNOSIS — E785 Hyperlipidemia, unspecified: Secondary | ICD-10-CM | POA: Diagnosis not present

## 2020-07-08 DIAGNOSIS — Z7982 Long term (current) use of aspirin: Secondary | ICD-10-CM | POA: Diagnosis not present

## 2020-07-08 DIAGNOSIS — I340 Nonrheumatic mitral (valve) insufficiency: Secondary | ICD-10-CM | POA: Diagnosis not present

## 2020-07-08 DIAGNOSIS — I5022 Chronic systolic (congestive) heart failure: Secondary | ICD-10-CM | POA: Diagnosis not present

## 2020-07-14 DIAGNOSIS — Z95811 Presence of heart assist device: Secondary | ICD-10-CM | POA: Diagnosis not present

## 2020-07-14 DIAGNOSIS — I517 Cardiomegaly: Secondary | ICD-10-CM | POA: Diagnosis not present

## 2020-07-14 DIAGNOSIS — Z952 Presence of prosthetic heart valve: Secondary | ICD-10-CM | POA: Diagnosis not present

## 2020-07-14 DIAGNOSIS — R918 Other nonspecific abnormal finding of lung field: Secondary | ICD-10-CM | POA: Diagnosis not present

## 2020-07-14 DIAGNOSIS — J90 Pleural effusion, not elsewhere classified: Secondary | ICD-10-CM | POA: Diagnosis not present

## 2020-07-15 DIAGNOSIS — J45909 Unspecified asthma, uncomplicated: Secondary | ICD-10-CM | POA: Diagnosis not present

## 2020-07-15 DIAGNOSIS — E785 Hyperlipidemia, unspecified: Secondary | ICD-10-CM | POA: Diagnosis not present

## 2020-07-15 DIAGNOSIS — Z7982 Long term (current) use of aspirin: Secondary | ICD-10-CM | POA: Diagnosis not present

## 2020-07-15 DIAGNOSIS — I2510 Atherosclerotic heart disease of native coronary artery without angina pectoris: Secondary | ICD-10-CM | POA: Diagnosis not present

## 2020-07-15 DIAGNOSIS — I255 Ischemic cardiomyopathy: Secondary | ICD-10-CM | POA: Diagnosis not present

## 2020-07-15 DIAGNOSIS — K219 Gastro-esophageal reflux disease without esophagitis: Secondary | ICD-10-CM | POA: Diagnosis not present

## 2020-07-15 DIAGNOSIS — D631 Anemia in chronic kidney disease: Secondary | ICD-10-CM | POA: Diagnosis not present

## 2020-07-15 DIAGNOSIS — I340 Nonrheumatic mitral (valve) insufficiency: Secondary | ICD-10-CM | POA: Diagnosis not present

## 2020-07-15 DIAGNOSIS — I130 Hypertensive heart and chronic kidney disease with heart failure and stage 1 through stage 4 chronic kidney disease, or unspecified chronic kidney disease: Secondary | ICD-10-CM | POA: Diagnosis not present

## 2020-07-15 DIAGNOSIS — I5022 Chronic systolic (congestive) heart failure: Secondary | ICD-10-CM | POA: Diagnosis not present

## 2020-07-15 DIAGNOSIS — G8929 Other chronic pain: Secondary | ICD-10-CM | POA: Diagnosis not present

## 2020-07-15 DIAGNOSIS — Z48812 Encounter for surgical aftercare following surgery on the circulatory system: Secondary | ICD-10-CM | POA: Diagnosis not present

## 2020-07-15 DIAGNOSIS — Z9181 History of falling: Secondary | ICD-10-CM | POA: Diagnosis not present

## 2020-07-15 DIAGNOSIS — Z7901 Long term (current) use of anticoagulants: Secondary | ICD-10-CM | POA: Diagnosis not present

## 2020-07-15 DIAGNOSIS — N182 Chronic kidney disease, stage 2 (mild): Secondary | ICD-10-CM | POA: Diagnosis not present

## 2020-07-15 DIAGNOSIS — Z95810 Presence of automatic (implantable) cardiac defibrillator: Secondary | ICD-10-CM | POA: Diagnosis not present

## 2020-07-15 DIAGNOSIS — Z951 Presence of aortocoronary bypass graft: Secondary | ICD-10-CM | POA: Diagnosis not present

## 2020-07-15 DIAGNOSIS — Z952 Presence of prosthetic heart valve: Secondary | ICD-10-CM | POA: Diagnosis not present

## 2020-07-15 DIAGNOSIS — I252 Old myocardial infarction: Secondary | ICD-10-CM | POA: Diagnosis not present

## 2020-07-15 DIAGNOSIS — M109 Gout, unspecified: Secondary | ICD-10-CM | POA: Diagnosis not present

## 2020-07-16 DIAGNOSIS — Z952 Presence of prosthetic heart valve: Secondary | ICD-10-CM | POA: Diagnosis not present

## 2020-07-16 DIAGNOSIS — I2510 Atherosclerotic heart disease of native coronary artery without angina pectoris: Secondary | ICD-10-CM | POA: Diagnosis not present

## 2020-07-16 DIAGNOSIS — Z951 Presence of aortocoronary bypass graft: Secondary | ICD-10-CM | POA: Diagnosis not present

## 2020-07-16 DIAGNOSIS — I130 Hypertensive heart and chronic kidney disease with heart failure and stage 1 through stage 4 chronic kidney disease, or unspecified chronic kidney disease: Secondary | ICD-10-CM | POA: Diagnosis not present

## 2020-07-16 DIAGNOSIS — J45909 Unspecified asthma, uncomplicated: Secondary | ICD-10-CM | POA: Diagnosis not present

## 2020-07-16 DIAGNOSIS — I340 Nonrheumatic mitral (valve) insufficiency: Secondary | ICD-10-CM | POA: Diagnosis not present

## 2020-07-16 DIAGNOSIS — I5022 Chronic systolic (congestive) heart failure: Secondary | ICD-10-CM | POA: Diagnosis not present

## 2020-07-16 DIAGNOSIS — K219 Gastro-esophageal reflux disease without esophagitis: Secondary | ICD-10-CM | POA: Diagnosis not present

## 2020-07-16 DIAGNOSIS — Z9181 History of falling: Secondary | ICD-10-CM | POA: Diagnosis not present

## 2020-07-16 DIAGNOSIS — N182 Chronic kidney disease, stage 2 (mild): Secondary | ICD-10-CM | POA: Diagnosis not present

## 2020-07-16 DIAGNOSIS — I255 Ischemic cardiomyopathy: Secondary | ICD-10-CM | POA: Diagnosis not present

## 2020-07-16 DIAGNOSIS — D631 Anemia in chronic kidney disease: Secondary | ICD-10-CM | POA: Diagnosis not present

## 2020-07-16 DIAGNOSIS — Z95810 Presence of automatic (implantable) cardiac defibrillator: Secondary | ICD-10-CM | POA: Diagnosis not present

## 2020-07-16 DIAGNOSIS — Z7901 Long term (current) use of anticoagulants: Secondary | ICD-10-CM | POA: Diagnosis not present

## 2020-07-16 DIAGNOSIS — E785 Hyperlipidemia, unspecified: Secondary | ICD-10-CM | POA: Diagnosis not present

## 2020-07-16 DIAGNOSIS — M109 Gout, unspecified: Secondary | ICD-10-CM | POA: Diagnosis not present

## 2020-07-16 DIAGNOSIS — I252 Old myocardial infarction: Secondary | ICD-10-CM | POA: Diagnosis not present

## 2020-07-16 DIAGNOSIS — Z7982 Long term (current) use of aspirin: Secondary | ICD-10-CM | POA: Diagnosis not present

## 2020-07-16 DIAGNOSIS — Z48812 Encounter for surgical aftercare following surgery on the circulatory system: Secondary | ICD-10-CM | POA: Diagnosis not present

## 2020-07-16 DIAGNOSIS — G8929 Other chronic pain: Secondary | ICD-10-CM | POA: Diagnosis not present

## 2020-07-22 DIAGNOSIS — J45909 Unspecified asthma, uncomplicated: Secondary | ICD-10-CM | POA: Diagnosis not present

## 2020-07-22 DIAGNOSIS — Z95811 Presence of heart assist device: Secondary | ICD-10-CM | POA: Diagnosis not present

## 2020-07-22 DIAGNOSIS — Z95810 Presence of automatic (implantable) cardiac defibrillator: Secondary | ICD-10-CM | POA: Diagnosis not present

## 2020-07-22 DIAGNOSIS — I340 Nonrheumatic mitral (valve) insufficiency: Secondary | ICD-10-CM | POA: Diagnosis not present

## 2020-07-22 DIAGNOSIS — E785 Hyperlipidemia, unspecified: Secondary | ICD-10-CM | POA: Diagnosis not present

## 2020-07-22 DIAGNOSIS — Z952 Presence of prosthetic heart valve: Secondary | ICD-10-CM | POA: Diagnosis not present

## 2020-07-22 DIAGNOSIS — Z951 Presence of aortocoronary bypass graft: Secondary | ICD-10-CM | POA: Diagnosis not present

## 2020-07-22 DIAGNOSIS — N182 Chronic kidney disease, stage 2 (mild): Secondary | ICD-10-CM | POA: Diagnosis not present

## 2020-07-22 DIAGNOSIS — I255 Ischemic cardiomyopathy: Secondary | ICD-10-CM | POA: Diagnosis not present

## 2020-07-22 DIAGNOSIS — Z9181 History of falling: Secondary | ICD-10-CM | POA: Diagnosis not present

## 2020-07-22 DIAGNOSIS — Z7901 Long term (current) use of anticoagulants: Secondary | ICD-10-CM | POA: Diagnosis not present

## 2020-07-22 DIAGNOSIS — K219 Gastro-esophageal reflux disease without esophagitis: Secondary | ICD-10-CM | POA: Diagnosis not present

## 2020-07-22 DIAGNOSIS — I5023 Acute on chronic systolic (congestive) heart failure: Secondary | ICD-10-CM | POA: Diagnosis not present

## 2020-07-22 DIAGNOSIS — I252 Old myocardial infarction: Secondary | ICD-10-CM | POA: Diagnosis not present

## 2020-07-22 DIAGNOSIS — I5022 Chronic systolic (congestive) heart failure: Secondary | ICD-10-CM | POA: Diagnosis not present

## 2020-07-22 DIAGNOSIS — I130 Hypertensive heart and chronic kidney disease with heart failure and stage 1 through stage 4 chronic kidney disease, or unspecified chronic kidney disease: Secondary | ICD-10-CM | POA: Diagnosis not present

## 2020-07-22 DIAGNOSIS — M109 Gout, unspecified: Secondary | ICD-10-CM | POA: Diagnosis not present

## 2020-07-22 DIAGNOSIS — Z7982 Long term (current) use of aspirin: Secondary | ICD-10-CM | POA: Diagnosis not present

## 2020-07-22 DIAGNOSIS — Z48812 Encounter for surgical aftercare following surgery on the circulatory system: Secondary | ICD-10-CM | POA: Diagnosis not present

## 2020-07-22 DIAGNOSIS — D631 Anemia in chronic kidney disease: Secondary | ICD-10-CM | POA: Diagnosis not present

## 2020-07-22 DIAGNOSIS — I2510 Atherosclerotic heart disease of native coronary artery without angina pectoris: Secondary | ICD-10-CM | POA: Diagnosis not present

## 2020-07-22 DIAGNOSIS — G8929 Other chronic pain: Secondary | ICD-10-CM | POA: Diagnosis not present

## 2020-07-29 DIAGNOSIS — Z952 Presence of prosthetic heart valve: Secondary | ICD-10-CM | POA: Diagnosis not present

## 2020-07-29 DIAGNOSIS — D631 Anemia in chronic kidney disease: Secondary | ICD-10-CM | POA: Diagnosis not present

## 2020-07-29 DIAGNOSIS — I340 Nonrheumatic mitral (valve) insufficiency: Secondary | ICD-10-CM | POA: Diagnosis not present

## 2020-07-29 DIAGNOSIS — I255 Ischemic cardiomyopathy: Secondary | ICD-10-CM | POA: Diagnosis not present

## 2020-07-29 DIAGNOSIS — I130 Hypertensive heart and chronic kidney disease with heart failure and stage 1 through stage 4 chronic kidney disease, or unspecified chronic kidney disease: Secondary | ICD-10-CM | POA: Diagnosis not present

## 2020-07-29 DIAGNOSIS — E785 Hyperlipidemia, unspecified: Secondary | ICD-10-CM | POA: Diagnosis not present

## 2020-07-29 DIAGNOSIS — Z95811 Presence of heart assist device: Secondary | ICD-10-CM | POA: Diagnosis not present

## 2020-07-29 DIAGNOSIS — Z9181 History of falling: Secondary | ICD-10-CM | POA: Diagnosis not present

## 2020-07-29 DIAGNOSIS — Z7901 Long term (current) use of anticoagulants: Secondary | ICD-10-CM | POA: Diagnosis not present

## 2020-07-29 DIAGNOSIS — Z95810 Presence of automatic (implantable) cardiac defibrillator: Secondary | ICD-10-CM | POA: Diagnosis not present

## 2020-07-29 DIAGNOSIS — J45909 Unspecified asthma, uncomplicated: Secondary | ICD-10-CM | POA: Diagnosis not present

## 2020-07-29 DIAGNOSIS — Z951 Presence of aortocoronary bypass graft: Secondary | ICD-10-CM | POA: Diagnosis not present

## 2020-07-29 DIAGNOSIS — G8929 Other chronic pain: Secondary | ICD-10-CM | POA: Diagnosis not present

## 2020-07-29 DIAGNOSIS — M109 Gout, unspecified: Secondary | ICD-10-CM | POA: Diagnosis not present

## 2020-07-29 DIAGNOSIS — N182 Chronic kidney disease, stage 2 (mild): Secondary | ICD-10-CM | POA: Diagnosis not present

## 2020-07-29 DIAGNOSIS — I252 Old myocardial infarction: Secondary | ICD-10-CM | POA: Diagnosis not present

## 2020-07-29 DIAGNOSIS — I5023 Acute on chronic systolic (congestive) heart failure: Secondary | ICD-10-CM | POA: Diagnosis not present

## 2020-07-29 DIAGNOSIS — I2510 Atherosclerotic heart disease of native coronary artery without angina pectoris: Secondary | ICD-10-CM | POA: Diagnosis not present

## 2020-07-29 DIAGNOSIS — K219 Gastro-esophageal reflux disease without esophagitis: Secondary | ICD-10-CM | POA: Diagnosis not present

## 2020-07-29 DIAGNOSIS — Z7982 Long term (current) use of aspirin: Secondary | ICD-10-CM | POA: Diagnosis not present

## 2020-07-29 DIAGNOSIS — Z48812 Encounter for surgical aftercare following surgery on the circulatory system: Secondary | ICD-10-CM | POA: Diagnosis not present

## 2020-07-29 DIAGNOSIS — I5022 Chronic systolic (congestive) heart failure: Secondary | ICD-10-CM | POA: Diagnosis not present

## 2020-08-05 ENCOUNTER — Ambulatory Visit (INDEPENDENT_AMBULATORY_CARE_PROVIDER_SITE_OTHER): Payer: Medicaid Other | Admitting: Family Medicine

## 2020-08-05 ENCOUNTER — Other Ambulatory Visit: Payer: Self-pay

## 2020-08-05 VITALS — HR 48 | Ht 71.0 in | Wt 158.2 lb

## 2020-08-05 DIAGNOSIS — Z95810 Presence of automatic (implantable) cardiac defibrillator: Secondary | ICD-10-CM | POA: Diagnosis not present

## 2020-08-05 DIAGNOSIS — M79672 Pain in left foot: Secondary | ICD-10-CM | POA: Diagnosis not present

## 2020-08-05 DIAGNOSIS — M542 Cervicalgia: Secondary | ICD-10-CM | POA: Insufficient documentation

## 2020-08-05 DIAGNOSIS — Z7901 Long term (current) use of anticoagulants: Secondary | ICD-10-CM | POA: Diagnosis not present

## 2020-08-05 DIAGNOSIS — E785 Hyperlipidemia, unspecified: Secondary | ICD-10-CM | POA: Diagnosis not present

## 2020-08-05 DIAGNOSIS — I5022 Chronic systolic (congestive) heart failure: Secondary | ICD-10-CM | POA: Diagnosis not present

## 2020-08-05 DIAGNOSIS — N182 Chronic kidney disease, stage 2 (mild): Secondary | ICD-10-CM | POA: Diagnosis not present

## 2020-08-05 DIAGNOSIS — I130 Hypertensive heart and chronic kidney disease with heart failure and stage 1 through stage 4 chronic kidney disease, or unspecified chronic kidney disease: Secondary | ICD-10-CM | POA: Diagnosis not present

## 2020-08-05 DIAGNOSIS — M109 Gout, unspecified: Secondary | ICD-10-CM | POA: Diagnosis not present

## 2020-08-05 DIAGNOSIS — Z48812 Encounter for surgical aftercare following surgery on the circulatory system: Secondary | ICD-10-CM | POA: Diagnosis not present

## 2020-08-05 DIAGNOSIS — I252 Old myocardial infarction: Secondary | ICD-10-CM | POA: Diagnosis not present

## 2020-08-05 DIAGNOSIS — I340 Nonrheumatic mitral (valve) insufficiency: Secondary | ICD-10-CM | POA: Diagnosis not present

## 2020-08-05 DIAGNOSIS — K219 Gastro-esophageal reflux disease without esophagitis: Secondary | ICD-10-CM | POA: Diagnosis not present

## 2020-08-05 DIAGNOSIS — Z95811 Presence of heart assist device: Secondary | ICD-10-CM | POA: Diagnosis not present

## 2020-08-05 DIAGNOSIS — I2510 Atherosclerotic heart disease of native coronary artery without angina pectoris: Secondary | ICD-10-CM | POA: Diagnosis not present

## 2020-08-05 DIAGNOSIS — Z951 Presence of aortocoronary bypass graft: Secondary | ICD-10-CM | POA: Diagnosis not present

## 2020-08-05 DIAGNOSIS — Z952 Presence of prosthetic heart valve: Secondary | ICD-10-CM | POA: Diagnosis not present

## 2020-08-05 DIAGNOSIS — I255 Ischemic cardiomyopathy: Secondary | ICD-10-CM | POA: Diagnosis not present

## 2020-08-05 DIAGNOSIS — Z7982 Long term (current) use of aspirin: Secondary | ICD-10-CM | POA: Diagnosis not present

## 2020-08-05 DIAGNOSIS — J45909 Unspecified asthma, uncomplicated: Secondary | ICD-10-CM | POA: Diagnosis not present

## 2020-08-05 DIAGNOSIS — D631 Anemia in chronic kidney disease: Secondary | ICD-10-CM | POA: Diagnosis not present

## 2020-08-05 DIAGNOSIS — I5023 Acute on chronic systolic (congestive) heart failure: Secondary | ICD-10-CM | POA: Diagnosis not present

## 2020-08-05 DIAGNOSIS — Z9181 History of falling: Secondary | ICD-10-CM | POA: Diagnosis not present

## 2020-08-05 DIAGNOSIS — G8929 Other chronic pain: Secondary | ICD-10-CM | POA: Diagnosis not present

## 2020-08-05 NOTE — Assessment & Plan Note (Signed)
Patient with neck pain and history of known degenerative changes in spine seen on imaging in 2010.  He has symptoms that are consistent with nerve impingement.  Discussed with preceptor Dr. Erin Hearing.  We will hold off on ordering imaging at this time and allow neurosurgery to evaluate and see if patient would be a candidate for surgery and order what ever imaging they find to be useful.  Patient referred to neurosurgery for evaluation.

## 2020-08-05 NOTE — Progress Notes (Signed)
SUBJECTIVE:   CHIEF COMPLAINT / HPI:   Neck and Shoulder Pain Has had multiple surgeries this month and was told that he was in a lot of different positions during surgery and he has been having worse pain since the last of his surgeries on 7/21 "feels like someone took super glue and glued my vertebrae down.  It feels like fire and goes into my trapezoid, and then goes down both arms, into his left thumb and finger tips, on right side it is in my elbow and I can't put it down" "My fingertips feel funky under hot/cold water, they feel like they have webs in them" "feels like when I'm opening a water bottle that the ridges on the cap are ripping my skin" Had pain many years ago but was always mild,  Feels like his sciatica in his bilateral arms Had cervical spondylosis and facet degeneration noted on CT cervical spine in 2010  Left foot  No injury Has noticed a "bone growing" on the lateral side of left foot Just noticed the pain since being out of the hospital Having trouble wearing shoes because of the pain Not tender Doesn't hurt to walk Just pressure from shoes makes it hurt  Thinks he has previously seen [facility]  PERTINENT  PMH / PSH: History of aortic valve replacement, CHF with LVAD, aortic dissection, HTN, CAD, Hx Thyroid Cancer, CKD 3, HLD  OBJECTIVE:   Pulse (!) 48   Ht 5\' 11"  (1.803 m)   Wt 158 lb 3.2 oz (71.8 kg)   SpO2 98%   BMI 22.06 kg/m    Physical Exam:  General: 59 y.o. male in NAD CV: LVAD Lungs: Breathing comfortably on room air Skin: warm and dry, cap refill <2 sec  Neck/Back: - Inspection: no gross deformity or asymmetry, swelling or ecchymosis - Palpation: TTP along all cervical spinous process and T1&2, TTP of left rhomboid and trapezius with pressure point at mid scapula level - ROM: full active ROM of the cervical spine with neck rotation, flexion, patient with almost no ROM in extension 2/2 pain - Strength: 5/5 wrist flexion,  extension, biceps flexion. 5/5 triceps extension. OK sign, interosseus strength intact  - Neuro: decreased sensation in T1 distribution of right arm, otherwise sensation intact in BUE - Special testing: equivocal Spurling's bilaterally  Limited Left Foot: Inspection:  Boney prominence over left 5th MTP base.  No swelling, erythema, or bruising.  Pes planus b/l. Palpation: Mild TTP base 5th MTP, no TTP along peroneal tendons Neurovascular: N/V intact distally in the lower extremity Special tests: Mild pain with grinding of left 5th MTP on cuboid   ASSESSMENT/PLAN:   Neck pain without injury Patient with neck pain and history of known degenerative changes in spine seen on imaging in 2010.  He has symptoms that are consistent with nerve impingement.  Discussed with preceptor Dr. Erin Hearing.  We will hold off on ordering imaging at this time and allow neurosurgery to evaluate and see if patient would be a candidate for surgery and order what ever imaging they find to be useful.  Patient referred to neurosurgery for evaluation.  Left foot pain Increased bony prominence at left fifth MTP base.  Discussed with patient it could be secondary to arthritis.  He also has significant pes planus bilaterally and could be affecting for the positioning of his foot.  Discussed with him options including referral to sports medicine for evaluation and possible orthotics, versus referral to podiatry.  At first, patient  wanted a referral to podiatry for possible surgery, but then stated that he would be open to seeing sports medicine first to see what they have to offer.  Referral placed for sports medicine.     Cleophas Dunker, [place]

## 2020-08-05 NOTE — Assessment & Plan Note (Addendum)
Increased bony prominence at left fifth MTP base.  Discussed with patient it could be secondary to arthritis.  He also has significant pes planus bilaterally and could be affecting for the positioning of his foot.  Discussed with him options including referral to sports medicine for evaluation and possible orthotics, versus referral to podiatry.  At first, patient wanted a referral to podiatry for possible surgery, but then stated that he would be open to seeing sports medicine first to see what they have to offer.  Referral placed for sports medicine.

## 2020-08-05 NOTE — Patient Instructions (Signed)
Thank you for coming to see me today. It was a pleasure. Today we talked about:   I have placed a referral to Neurosurgery for your neck pain.  If you do not hear from them in the next 2 weeks, please give [place] a call.  Use heat on your neck to see if this will help.  Avoid extending your neck too much.  I have placed a referral to Sports Medicine for your foot pain.  If you do not hear from them in the next 2 weeks, please give [place] a call.  Please follow-up with PCP as scheduled.  If you have any questions or concerns, please do not hesitate to call the office at [phone].  Best,   [place] Constable, DO

## 2020-08-09 ENCOUNTER — Ambulatory Visit (INDEPENDENT_AMBULATORY_CARE_PROVIDER_SITE_OTHER): Payer: Medicaid Other | Admitting: *Deleted

## 2020-08-09 DIAGNOSIS — I5020 Unspecified systolic (congestive) heart failure: Secondary | ICD-10-CM

## 2020-08-09 LAB — CUP PACEART REMOTE DEVICE CHECK
Battery Remaining Longevity: 60 mo
Battery Remaining Percentage: 65 %
Brady Statistic RV Percent Paced: 0 %
Date Time Interrogation Session: 20210816032100
HighPow Impedance: 52 Ohm
Implantable Lead Implant Date: 20171114
Implantable Lead Location: 753860
Implantable Lead Model: 181
Implantable Lead Serial Number: 333496
Implantable Pulse Generator Implant Date: 20131220
Lead Channel Impedance Value: 425 Ohm
Lead Channel Pacing Threshold Amplitude: 0.6 V
Lead Channel Pacing Threshold Pulse Width: 0.4 ms
Lead Channel Setting Pacing Amplitude: 2 V
Lead Channel Setting Pacing Pulse Width: 0.4 ms
Lead Channel Setting Sensing Sensitivity: 0.5 mV
Pulse Gen Serial Number: 124654

## 2020-08-10 DIAGNOSIS — I513 Intracardiac thrombosis, not elsewhere classified: Secondary | ICD-10-CM | POA: Diagnosis not present

## 2020-08-10 DIAGNOSIS — I712 Thoracic aortic aneurysm, without rupture: Secondary | ICD-10-CM | POA: Diagnosis not present

## 2020-08-10 DIAGNOSIS — Z95811 Presence of heart assist device: Secondary | ICD-10-CM | POA: Diagnosis not present

## 2020-08-10 DIAGNOSIS — I5023 Acute on chronic systolic (congestive) heart failure: Secondary | ICD-10-CM | POA: Diagnosis not present

## 2020-08-10 DIAGNOSIS — I10 Essential (primary) hypertension: Secondary | ICD-10-CM | POA: Diagnosis not present

## 2020-08-10 DIAGNOSIS — Z952 Presence of prosthetic heart valve: Secondary | ICD-10-CM | POA: Diagnosis not present

## 2020-08-10 DIAGNOSIS — Z01818 Encounter for other preprocedural examination: Secondary | ICD-10-CM | POA: Diagnosis not present

## 2020-08-10 DIAGNOSIS — I7103 Dissection of thoracoabdominal aorta: Secondary | ICD-10-CM | POA: Diagnosis not present

## 2020-08-10 NOTE — Progress Notes (Signed)
Remote ICD transmission.   

## 2020-08-13 ENCOUNTER — Ambulatory Visit: Payer: Self-pay

## 2020-08-13 ENCOUNTER — Other Ambulatory Visit: Payer: Self-pay

## 2020-08-13 ENCOUNTER — Ambulatory Visit: Payer: Medicaid Other | Admitting: Family Medicine

## 2020-08-13 VITALS — BP 114/84 | Ht 71.0 in | Wt 150.0 lb

## 2020-08-13 DIAGNOSIS — M79672 Pain in left foot: Secondary | ICD-10-CM | POA: Diagnosis not present

## 2020-08-13 MED ORDER — DICLOFENAC SODIUM 1 % EX GEL: 2.0000 g | Freq: Four times a day (QID) | CUTANEOUS | Status: DC

## 2020-08-13 NOTE — Progress Notes (Signed)
   Office Visit Note   Patient: Chad [name].           Date of Birth: 03/22/1961           MRN: [id number] Visit Date: 08/13/2020 Requested by: Lind Covert, MD [place],  [place] [id number] PCP: Lattie Haw, MD  Subjective: CC: Left Foot pain  HPI: Patient is a 59 year old male presenting to clinic today with concerns of "several years" of left lateral foot pain.  He states that he has a history of gout, and that when his gout first flared he noticed it in the lateral aspect of his left foot.  He then noticed some swelling in this area, which is gotten progressively worse over the past few years.  This swollen area is extremely tender to palpation, and hurts when he is wearing his shoes.  He has not tried any home care for this.  He states that he would like to know what is going on, and if there is anything he can do to fix it.  He denies any trauma to this area.              ROS:   All other systems were reviewed and are negative.  Objective: Vital Signs: BP 114/84   Ht 5\' 11"  (1.803 m)   Wt 150 lb (68 kg)   BMI 20.92 kg/m   Physical Exam:  General:  Alert and oriented, in no acute distress. Pulm:  Breathing unlabored. Psy:  Normal mood, congruent affect. Skin: Left foot with no bruising or rashes. Left foot: Normal gait. Marked pes planus bilaterally. Full range of motion of ankle. Bony outgrowing over the base of the fifth metatarsal on the left foot.  Overlying skin is intact, with no erythema or ulcerative lesions.  However, this area is extremely tender to palpation.  No tenderness throughout midfoot, no tenderness over metatarsal heads.  5 out of 5 strength with ankle dorsiflexion, plantarflexion, inversion/eversion. Toes with brisk capillary refill. Distal sensation intact.  Imaging: Limited extremity ultrasound-left foot. Area of concern was examined under ultrasound, demonstrating bony hypertrophy and spurring.  Mild effusion  appreciated within fifth tarsometatarsal joint.  Cuboid appears unremarkable.   Assessment & Plan: 59 year old male presenting to clinic today with concerns of lateral left foot pain.  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   -Discussed changing footwear to avoid friction in this area, including strap sandals, or cutting an area out of his tennis shoes if he prefers to wear closed toe shoes.  -Offered patient a corticosteroid injection into the joint, which may offer significant immediate pain relief, however patient declined at this time in favor of topical NSAIDs.  Voltaren gel ordered. -Due to patient's comorbidities, he would be considered a poor surgical candidate for podiatry at this time.  Could consider in the future if his health improves and his symptoms worsen.  -Patient had no further questions or concerns at this time.

## 2020-08-13 NOTE — Progress Notes (Signed)
[facility]: Attending Note: I have reviewed the chart, discussed wit the Sports Medicine Fellow. I agree with assessment and treatment plan as detailed in the [place] note.   Tendinosis/tendinopathy at the tendon insertion at the fifth metatarsal on his left foot.  Chronic.  I do not see a component of gout on ultrasound.  He does have a lot of chronic changes of irregularity of the cortical bone surface.  We offered him corticosteroid injection but he did not want to pursue that.  He has a lot going on and is not really want to consider operative intervention either.  He would be comfortable with some topical NSAIDs.  Given his other medical problems he is not a candidate for oral NSAIDs.  Follow-up as needed

## 2020-08-18 DIAGNOSIS — D631 Anemia in chronic kidney disease: Secondary | ICD-10-CM | POA: Diagnosis not present

## 2020-08-18 DIAGNOSIS — Z95811 Presence of heart assist device: Secondary | ICD-10-CM | POA: Diagnosis not present

## 2020-08-18 DIAGNOSIS — Z48812 Encounter for surgical aftercare following surgery on the circulatory system: Secondary | ICD-10-CM | POA: Diagnosis not present

## 2020-08-18 DIAGNOSIS — I252 Old myocardial infarction: Secondary | ICD-10-CM | POA: Diagnosis not present

## 2020-08-18 DIAGNOSIS — Z7901 Long term (current) use of anticoagulants: Secondary | ICD-10-CM | POA: Diagnosis not present

## 2020-08-18 DIAGNOSIS — N182 Chronic kidney disease, stage 2 (mild): Secondary | ICD-10-CM | POA: Diagnosis not present

## 2020-08-18 DIAGNOSIS — M109 Gout, unspecified: Secondary | ICD-10-CM | POA: Diagnosis not present

## 2020-08-18 DIAGNOSIS — I2510 Atherosclerotic heart disease of native coronary artery without angina pectoris: Secondary | ICD-10-CM | POA: Diagnosis not present

## 2020-08-18 DIAGNOSIS — I5022 Chronic systolic (congestive) heart failure: Secondary | ICD-10-CM | POA: Diagnosis not present

## 2020-08-18 DIAGNOSIS — I5023 Acute on chronic systolic (congestive) heart failure: Secondary | ICD-10-CM | POA: Diagnosis not present

## 2020-08-18 DIAGNOSIS — J45909 Unspecified asthma, uncomplicated: Secondary | ICD-10-CM | POA: Diagnosis not present

## 2020-08-18 DIAGNOSIS — E785 Hyperlipidemia, unspecified: Secondary | ICD-10-CM | POA: Diagnosis not present

## 2020-08-18 DIAGNOSIS — Z7982 Long term (current) use of aspirin: Secondary | ICD-10-CM | POA: Diagnosis not present

## 2020-08-18 DIAGNOSIS — Z9181 History of falling: Secondary | ICD-10-CM | POA: Diagnosis not present

## 2020-08-18 DIAGNOSIS — I130 Hypertensive heart and chronic kidney disease with heart failure and stage 1 through stage 4 chronic kidney disease, or unspecified chronic kidney disease: Secondary | ICD-10-CM | POA: Diagnosis not present

## 2020-08-18 DIAGNOSIS — I255 Ischemic cardiomyopathy: Secondary | ICD-10-CM | POA: Diagnosis not present

## 2020-08-18 DIAGNOSIS — Z951 Presence of aortocoronary bypass graft: Secondary | ICD-10-CM | POA: Diagnosis not present

## 2020-08-18 DIAGNOSIS — I340 Nonrheumatic mitral (valve) insufficiency: Secondary | ICD-10-CM | POA: Diagnosis not present

## 2020-08-18 DIAGNOSIS — Z952 Presence of prosthetic heart valve: Secondary | ICD-10-CM | POA: Diagnosis not present

## 2020-08-18 DIAGNOSIS — G8929 Other chronic pain: Secondary | ICD-10-CM | POA: Diagnosis not present

## 2020-08-18 DIAGNOSIS — K219 Gastro-esophageal reflux disease without esophagitis: Secondary | ICD-10-CM | POA: Diagnosis not present

## 2020-08-18 DIAGNOSIS — Z95810 Presence of automatic (implantable) cardiac defibrillator: Secondary | ICD-10-CM | POA: Diagnosis not present

## 2020-08-20 ENCOUNTER — Other Ambulatory Visit (HOSPITAL_COMMUNITY): Payer: Self-pay

## 2020-08-23 ENCOUNTER — Other Ambulatory Visit (HOSPITAL_COMMUNITY): Payer: Self-pay | Admitting: Unknown Physician Specialty

## 2020-08-23 ENCOUNTER — Ambulatory Visit (HOSPITAL_COMMUNITY)
Admission: RE | Admit: 2020-08-23 | Discharge: 2020-08-23 | Disposition: A | Discharge status: Home / Self Care | Payer: Medicaid Other | Source: Ambulatory Visit | Attending: Registered Nurse | Admitting: Registered Nurse

## 2020-08-23 ENCOUNTER — Other Ambulatory Visit: Payer: Self-pay

## 2020-08-23 DIAGNOSIS — D509 Iron deficiency anemia, unspecified: Secondary | ICD-10-CM | POA: Insufficient documentation

## 2020-08-23 MED ORDER — DIPHENHYDRAMINE HCL 25 MG PO CAPS: 25.0000 mg | ORAL_CAPSULE | Freq: Once | ORAL | Status: DC

## 2020-08-23 MED ORDER — ACETAMINOPHEN 325 MG PO TABS
ORAL_TABLET | ORAL | Status: AC
  Filled 2020-08-23: qty 2

## 2020-08-23 MED ORDER — ACETAMINOPHEN 325 MG PO TABS: 650.0000 mg | ORAL_TABLET | Freq: Once | ORAL | Status: DC

## 2020-08-23 MED ORDER — DIPHENHYDRAMINE HCL 25 MG PO CAPS
ORAL_CAPSULE | ORAL | Status: AC
  Filled 2020-08-23: qty 1

## 2020-08-23 MED ORDER — SODIUM CHLORIDE 0.9 % IV SOLN
510.0000 mg | INTRAVENOUS | Status: DC
  Administered 2020-08-23: 510 mg via INTRAVENOUS
  Filled 2020-08-23: qty 17

## 2020-08-23 NOTE — Discharge Instructions (Signed)

## 2020-08-25 ENCOUNTER — Telehealth (HOSPITAL_COMMUNITY): Payer: Self-pay | Admitting: *Deleted

## 2020-08-25 MED ORDER — GABAPENTIN 300 MG PO CAPS: 300.0000 mg | ORAL_CAPSULE | Freq: Every day | ORAL | 3 refills | Status: DC

## 2020-08-25 NOTE — Telephone Encounter (Signed)
Chad [name] called on behalf of pt stating pt came in office on Monday and Dr.Bensimhon suggested prescribing gabapentin. She wants to know if this is still an option if so can we send it to the pharmacy. I do not see any notes about gabapentin.  Routed to [place] for advice

## 2020-08-25 NOTE — Telephone Encounter (Signed)
Medication sent pt aware

## 2020-08-25 NOTE — Telephone Encounter (Signed)
Started gabapentin 300 qhs

## 2020-08-26 DIAGNOSIS — Z9181 History of falling: Secondary | ICD-10-CM | POA: Diagnosis not present

## 2020-08-26 DIAGNOSIS — K219 Gastro-esophageal reflux disease without esophagitis: Secondary | ICD-10-CM | POA: Diagnosis not present

## 2020-08-26 DIAGNOSIS — J45909 Unspecified asthma, uncomplicated: Secondary | ICD-10-CM | POA: Diagnosis not present

## 2020-08-26 DIAGNOSIS — G8929 Other chronic pain: Secondary | ICD-10-CM | POA: Diagnosis not present

## 2020-08-26 DIAGNOSIS — Z7901 Long term (current) use of anticoagulants: Secondary | ICD-10-CM | POA: Diagnosis not present

## 2020-08-26 DIAGNOSIS — Z95810 Presence of automatic (implantable) cardiac defibrillator: Secondary | ICD-10-CM | POA: Diagnosis not present

## 2020-08-26 DIAGNOSIS — I130 Hypertensive heart and chronic kidney disease with heart failure and stage 1 through stage 4 chronic kidney disease, or unspecified chronic kidney disease: Secondary | ICD-10-CM | POA: Diagnosis not present

## 2020-08-26 DIAGNOSIS — Z48812 Encounter for surgical aftercare following surgery on the circulatory system: Secondary | ICD-10-CM | POA: Diagnosis not present

## 2020-08-26 DIAGNOSIS — E785 Hyperlipidemia, unspecified: Secondary | ICD-10-CM | POA: Diagnosis not present

## 2020-08-26 DIAGNOSIS — I5023 Acute on chronic systolic (congestive) heart failure: Secondary | ICD-10-CM | POA: Diagnosis not present

## 2020-08-26 DIAGNOSIS — D631 Anemia in chronic kidney disease: Secondary | ICD-10-CM | POA: Diagnosis not present

## 2020-08-26 DIAGNOSIS — I340 Nonrheumatic mitral (valve) insufficiency: Secondary | ICD-10-CM | POA: Diagnosis not present

## 2020-08-26 DIAGNOSIS — Z952 Presence of prosthetic heart valve: Secondary | ICD-10-CM | POA: Diagnosis not present

## 2020-08-26 DIAGNOSIS — I252 Old myocardial infarction: Secondary | ICD-10-CM | POA: Diagnosis not present

## 2020-08-26 DIAGNOSIS — I2510 Atherosclerotic heart disease of native coronary artery without angina pectoris: Secondary | ICD-10-CM | POA: Diagnosis not present

## 2020-08-26 DIAGNOSIS — Z95811 Presence of heart assist device: Secondary | ICD-10-CM | POA: Diagnosis not present

## 2020-08-26 DIAGNOSIS — Z951 Presence of aortocoronary bypass graft: Secondary | ICD-10-CM | POA: Diagnosis not present

## 2020-08-26 DIAGNOSIS — I5022 Chronic systolic (congestive) heart failure: Secondary | ICD-10-CM | POA: Diagnosis not present

## 2020-08-26 DIAGNOSIS — N182 Chronic kidney disease, stage 2 (mild): Secondary | ICD-10-CM | POA: Diagnosis not present

## 2020-08-26 DIAGNOSIS — Z7982 Long term (current) use of aspirin: Secondary | ICD-10-CM | POA: Diagnosis not present

## 2020-08-26 DIAGNOSIS — I255 Ischemic cardiomyopathy: Secondary | ICD-10-CM | POA: Diagnosis not present

## 2020-08-26 DIAGNOSIS — M109 Gout, unspecified: Secondary | ICD-10-CM | POA: Diagnosis not present

## 2020-08-30 DIAGNOSIS — Z95810 Presence of automatic (implantable) cardiac defibrillator: Secondary | ICD-10-CM | POA: Diagnosis not present

## 2020-08-30 DIAGNOSIS — I252 Old myocardial infarction: Secondary | ICD-10-CM | POA: Diagnosis not present

## 2020-08-30 DIAGNOSIS — J45909 Unspecified asthma, uncomplicated: Secondary | ICD-10-CM | POA: Diagnosis not present

## 2020-08-30 DIAGNOSIS — N182 Chronic kidney disease, stage 2 (mild): Secondary | ICD-10-CM | POA: Diagnosis not present

## 2020-08-30 DIAGNOSIS — E785 Hyperlipidemia, unspecified: Secondary | ICD-10-CM | POA: Diagnosis not present

## 2020-08-30 DIAGNOSIS — M109 Gout, unspecified: Secondary | ICD-10-CM | POA: Diagnosis not present

## 2020-08-30 DIAGNOSIS — Z7982 Long term (current) use of aspirin: Secondary | ICD-10-CM | POA: Diagnosis not present

## 2020-08-30 DIAGNOSIS — Z48812 Encounter for surgical aftercare following surgery on the circulatory system: Secondary | ICD-10-CM | POA: Diagnosis not present

## 2020-08-30 DIAGNOSIS — Z7901 Long term (current) use of anticoagulants: Secondary | ICD-10-CM | POA: Diagnosis not present

## 2020-08-30 DIAGNOSIS — Z9181 History of falling: Secondary | ICD-10-CM | POA: Diagnosis not present

## 2020-08-30 DIAGNOSIS — D631 Anemia in chronic kidney disease: Secondary | ICD-10-CM | POA: Diagnosis not present

## 2020-08-30 DIAGNOSIS — I5022 Chronic systolic (congestive) heart failure: Secondary | ICD-10-CM | POA: Diagnosis not present

## 2020-08-30 DIAGNOSIS — I255 Ischemic cardiomyopathy: Secondary | ICD-10-CM | POA: Diagnosis not present

## 2020-08-30 DIAGNOSIS — G8929 Other chronic pain: Secondary | ICD-10-CM | POA: Diagnosis not present

## 2020-08-30 DIAGNOSIS — K219 Gastro-esophageal reflux disease without esophagitis: Secondary | ICD-10-CM | POA: Diagnosis not present

## 2020-08-30 DIAGNOSIS — Z951 Presence of aortocoronary bypass graft: Secondary | ICD-10-CM | POA: Diagnosis not present

## 2020-08-30 DIAGNOSIS — Z952 Presence of prosthetic heart valve: Secondary | ICD-10-CM | POA: Diagnosis not present

## 2020-08-30 DIAGNOSIS — I340 Nonrheumatic mitral (valve) insufficiency: Secondary | ICD-10-CM | POA: Diagnosis not present

## 2020-08-30 DIAGNOSIS — I130 Hypertensive heart and chronic kidney disease with heart failure and stage 1 through stage 4 chronic kidney disease, or unspecified chronic kidney disease: Secondary | ICD-10-CM | POA: Diagnosis not present

## 2020-08-30 DIAGNOSIS — I2510 Atherosclerotic heart disease of native coronary artery without angina pectoris: Secondary | ICD-10-CM | POA: Diagnosis not present

## 2020-08-31 ENCOUNTER — Other Ambulatory Visit: Payer: Self-pay

## 2020-08-31 ENCOUNTER — Ambulatory Visit (HOSPITAL_COMMUNITY)
Admission: RE | Admit: 2020-08-31 | Discharge: 2020-08-31 | Disposition: A | Discharge status: Home / Self Care | Payer: Medicaid Other | Source: Ambulatory Visit | Attending: Registered Nurse | Admitting: Registered Nurse

## 2020-08-31 DIAGNOSIS — D509 Iron deficiency anemia, unspecified: Secondary | ICD-10-CM

## 2020-08-31 MED ORDER — DIPHENHYDRAMINE HCL 25 MG PO CAPS: 25.0000 mg | ORAL_CAPSULE | Freq: Once | ORAL | Status: DC

## 2020-08-31 MED ORDER — SODIUM CHLORIDE 0.9 % IV SOLN
510.0000 mg | INTRAVENOUS | Status: DC
  Administered 2020-08-31: 510 mg via INTRAVENOUS
  Filled 2020-08-31: qty 17

## 2020-08-31 MED ORDER — ACETAMINOPHEN 325 MG PO TABS: 650.0000 mg | ORAL_TABLET | Freq: Once | ORAL | Status: DC

## 2020-09-02 DIAGNOSIS — I255 Ischemic cardiomyopathy: Secondary | ICD-10-CM | POA: Diagnosis not present

## 2020-09-02 DIAGNOSIS — Z5181 Encounter for therapeutic drug level monitoring: Secondary | ICD-10-CM | POA: Diagnosis not present

## 2020-09-02 DIAGNOSIS — I130 Hypertensive heart and chronic kidney disease with heart failure and stage 1 through stage 4 chronic kidney disease, or unspecified chronic kidney disease: Secondary | ICD-10-CM | POA: Diagnosis not present

## 2020-09-02 DIAGNOSIS — J45909 Unspecified asthma, uncomplicated: Secondary | ICD-10-CM | POA: Diagnosis not present

## 2020-09-02 DIAGNOSIS — Z95810 Presence of automatic (implantable) cardiac defibrillator: Secondary | ICD-10-CM | POA: Diagnosis not present

## 2020-09-02 DIAGNOSIS — Z9181 History of falling: Secondary | ICD-10-CM | POA: Diagnosis not present

## 2020-09-02 DIAGNOSIS — I5022 Chronic systolic (congestive) heart failure: Secondary | ICD-10-CM | POA: Diagnosis not present

## 2020-09-02 DIAGNOSIS — M109 Gout, unspecified: Secondary | ICD-10-CM | POA: Diagnosis not present

## 2020-09-02 DIAGNOSIS — Z951 Presence of aortocoronary bypass graft: Secondary | ICD-10-CM | POA: Diagnosis not present

## 2020-09-02 DIAGNOSIS — D631 Anemia in chronic kidney disease: Secondary | ICD-10-CM | POA: Diagnosis not present

## 2020-09-02 DIAGNOSIS — K219 Gastro-esophageal reflux disease without esophagitis: Secondary | ICD-10-CM | POA: Diagnosis not present

## 2020-09-02 DIAGNOSIS — I2510 Atherosclerotic heart disease of native coronary artery without angina pectoris: Secondary | ICD-10-CM | POA: Diagnosis not present

## 2020-09-02 DIAGNOSIS — I340 Nonrheumatic mitral (valve) insufficiency: Secondary | ICD-10-CM | POA: Diagnosis not present

## 2020-09-02 DIAGNOSIS — Z95811 Presence of heart assist device: Secondary | ICD-10-CM | POA: Diagnosis not present

## 2020-09-02 DIAGNOSIS — Z7982 Long term (current) use of aspirin: Secondary | ICD-10-CM | POA: Diagnosis not present

## 2020-09-02 DIAGNOSIS — Z7901 Long term (current) use of anticoagulants: Secondary | ICD-10-CM | POA: Diagnosis not present

## 2020-09-02 DIAGNOSIS — E785 Hyperlipidemia, unspecified: Secondary | ICD-10-CM | POA: Diagnosis not present

## 2020-09-02 DIAGNOSIS — G8929 Other chronic pain: Secondary | ICD-10-CM | POA: Diagnosis not present

## 2020-09-02 DIAGNOSIS — I5023 Acute on chronic systolic (congestive) heart failure: Secondary | ICD-10-CM | POA: Diagnosis not present

## 2020-09-02 DIAGNOSIS — I252 Old myocardial infarction: Secondary | ICD-10-CM | POA: Diagnosis not present

## 2020-09-02 DIAGNOSIS — N182 Chronic kidney disease, stage 2 (mild): Secondary | ICD-10-CM | POA: Diagnosis not present

## 2020-09-02 DIAGNOSIS — Z952 Presence of prosthetic heart valve: Secondary | ICD-10-CM | POA: Diagnosis not present

## 2020-09-03 ENCOUNTER — Telehealth: Payer: Self-pay

## 2020-09-03 DIAGNOSIS — E785 Hyperlipidemia, unspecified: Secondary | ICD-10-CM

## 2020-09-03 MED ORDER — PRALUENT 75 MG/ML ~~LOC~~ SOAJ: 75.0000 mg | SUBCUTANEOUS | 11 refills | Status: DC

## 2020-09-03 NOTE — Telephone Encounter (Signed)
Called and spoke w/pt they stated that they are tolerating the praluent 75mg  well and I sent a refill and ordered a lipid panel but the pt was unable to schedule until October which is past his pa due date. I explained that there may be a lapse in how he can not pick up the med he stated that he had enough to get him until October. Will wait for labs to be completed and resend a pa for praluent 75mg 

## 2020-09-09 DIAGNOSIS — I2510 Atherosclerotic heart disease of native coronary artery without angina pectoris: Secondary | ICD-10-CM | POA: Diagnosis not present

## 2020-09-09 DIAGNOSIS — Z95811 Presence of heart assist device: Secondary | ICD-10-CM | POA: Diagnosis not present

## 2020-09-09 DIAGNOSIS — I5022 Chronic systolic (congestive) heart failure: Secondary | ICD-10-CM | POA: Diagnosis not present

## 2020-09-09 DIAGNOSIS — I130 Hypertensive heart and chronic kidney disease with heart failure and stage 1 through stage 4 chronic kidney disease, or unspecified chronic kidney disease: Secondary | ICD-10-CM | POA: Diagnosis not present

## 2020-09-09 DIAGNOSIS — I255 Ischemic cardiomyopathy: Secondary | ICD-10-CM | POA: Diagnosis not present

## 2020-09-09 DIAGNOSIS — G8929 Other chronic pain: Secondary | ICD-10-CM | POA: Diagnosis not present

## 2020-09-09 DIAGNOSIS — I252 Old myocardial infarction: Secondary | ICD-10-CM | POA: Diagnosis not present

## 2020-09-09 DIAGNOSIS — M109 Gout, unspecified: Secondary | ICD-10-CM | POA: Diagnosis not present

## 2020-09-09 DIAGNOSIS — Z9181 History of falling: Secondary | ICD-10-CM | POA: Diagnosis not present

## 2020-09-09 DIAGNOSIS — E785 Hyperlipidemia, unspecified: Secondary | ICD-10-CM | POA: Diagnosis not present

## 2020-09-09 DIAGNOSIS — Z951 Presence of aortocoronary bypass graft: Secondary | ICD-10-CM | POA: Diagnosis not present

## 2020-09-09 DIAGNOSIS — K219 Gastro-esophageal reflux disease without esophagitis: Secondary | ICD-10-CM | POA: Diagnosis not present

## 2020-09-09 DIAGNOSIS — Z95810 Presence of automatic (implantable) cardiac defibrillator: Secondary | ICD-10-CM | POA: Diagnosis not present

## 2020-09-09 DIAGNOSIS — I340 Nonrheumatic mitral (valve) insufficiency: Secondary | ICD-10-CM | POA: Diagnosis not present

## 2020-09-09 DIAGNOSIS — Z7982 Long term (current) use of aspirin: Secondary | ICD-10-CM | POA: Diagnosis not present

## 2020-09-09 DIAGNOSIS — N182 Chronic kidney disease, stage 2 (mild): Secondary | ICD-10-CM | POA: Diagnosis not present

## 2020-09-09 DIAGNOSIS — J45909 Unspecified asthma, uncomplicated: Secondary | ICD-10-CM | POA: Diagnosis not present

## 2020-09-09 DIAGNOSIS — Z952 Presence of prosthetic heart valve: Secondary | ICD-10-CM | POA: Diagnosis not present

## 2020-09-09 DIAGNOSIS — Z7901 Long term (current) use of anticoagulants: Secondary | ICD-10-CM | POA: Diagnosis not present

## 2020-09-09 DIAGNOSIS — Z5181 Encounter for therapeutic drug level monitoring: Secondary | ICD-10-CM | POA: Diagnosis not present

## 2020-09-09 DIAGNOSIS — I5023 Acute on chronic systolic (congestive) heart failure: Secondary | ICD-10-CM | POA: Diagnosis not present

## 2020-09-09 DIAGNOSIS — D631 Anemia in chronic kidney disease: Secondary | ICD-10-CM | POA: Diagnosis not present

## 2020-09-14 DIAGNOSIS — I7103 Dissection of thoracoabdominal aorta: Secondary | ICD-10-CM | POA: Diagnosis not present

## 2020-09-14 DIAGNOSIS — Z95811 Presence of heart assist device: Secondary | ICD-10-CM | POA: Diagnosis not present

## 2020-09-14 DIAGNOSIS — I10 Essential (primary) hypertension: Secondary | ICD-10-CM | POA: Diagnosis not present

## 2020-09-14 DIAGNOSIS — I5023 Acute on chronic systolic (congestive) heart failure: Secondary | ICD-10-CM | POA: Diagnosis not present

## 2020-09-16 DIAGNOSIS — N182 Chronic kidney disease, stage 2 (mild): Secondary | ICD-10-CM | POA: Diagnosis not present

## 2020-09-16 DIAGNOSIS — Z9181 History of falling: Secondary | ICD-10-CM | POA: Diagnosis not present

## 2020-09-16 DIAGNOSIS — J45909 Unspecified asthma, uncomplicated: Secondary | ICD-10-CM | POA: Diagnosis not present

## 2020-09-16 DIAGNOSIS — I252 Old myocardial infarction: Secondary | ICD-10-CM | POA: Diagnosis not present

## 2020-09-16 DIAGNOSIS — G8929 Other chronic pain: Secondary | ICD-10-CM | POA: Diagnosis not present

## 2020-09-16 DIAGNOSIS — I5023 Acute on chronic systolic (congestive) heart failure: Secondary | ICD-10-CM | POA: Diagnosis not present

## 2020-09-16 DIAGNOSIS — M109 Gout, unspecified: Secondary | ICD-10-CM | POA: Diagnosis not present

## 2020-09-16 DIAGNOSIS — Z952 Presence of prosthetic heart valve: Secondary | ICD-10-CM | POA: Diagnosis not present

## 2020-09-16 DIAGNOSIS — I2510 Atherosclerotic heart disease of native coronary artery without angina pectoris: Secondary | ICD-10-CM | POA: Diagnosis not present

## 2020-09-16 DIAGNOSIS — Z7982 Long term (current) use of aspirin: Secondary | ICD-10-CM | POA: Diagnosis not present

## 2020-09-16 DIAGNOSIS — Z951 Presence of aortocoronary bypass graft: Secondary | ICD-10-CM | POA: Diagnosis not present

## 2020-09-16 DIAGNOSIS — Z5181 Encounter for therapeutic drug level monitoring: Secondary | ICD-10-CM | POA: Diagnosis not present

## 2020-09-16 DIAGNOSIS — I130 Hypertensive heart and chronic kidney disease with heart failure and stage 1 through stage 4 chronic kidney disease, or unspecified chronic kidney disease: Secondary | ICD-10-CM | POA: Diagnosis not present

## 2020-09-16 DIAGNOSIS — Z95810 Presence of automatic (implantable) cardiac defibrillator: Secondary | ICD-10-CM | POA: Diagnosis not present

## 2020-09-16 DIAGNOSIS — E785 Hyperlipidemia, unspecified: Secondary | ICD-10-CM | POA: Diagnosis not present

## 2020-09-16 DIAGNOSIS — Z7901 Long term (current) use of anticoagulants: Secondary | ICD-10-CM | POA: Diagnosis not present

## 2020-09-16 DIAGNOSIS — I340 Nonrheumatic mitral (valve) insufficiency: Secondary | ICD-10-CM | POA: Diagnosis not present

## 2020-09-16 DIAGNOSIS — D631 Anemia in chronic kidney disease: Secondary | ICD-10-CM | POA: Diagnosis not present

## 2020-09-16 DIAGNOSIS — K219 Gastro-esophageal reflux disease without esophagitis: Secondary | ICD-10-CM | POA: Diagnosis not present

## 2020-09-16 DIAGNOSIS — I5022 Chronic systolic (congestive) heart failure: Secondary | ICD-10-CM | POA: Diagnosis not present

## 2020-09-16 DIAGNOSIS — Z95811 Presence of heart assist device: Secondary | ICD-10-CM | POA: Diagnosis not present

## 2020-09-16 DIAGNOSIS — I255 Ischemic cardiomyopathy: Secondary | ICD-10-CM | POA: Diagnosis not present

## 2020-09-17 IMAGING — DX DG CHEST 1V PORT
1 series · 2 of 2 positions shown · non-contrast
Comparison: December 16, 2019

CLINICAL DATA: Shortness of breath

EXAM:
PORTABLE CHEST 1 VIEW

[Series 1: chest · 0.14mm/px · 2 of 2 slices shown]
[im 1/2]
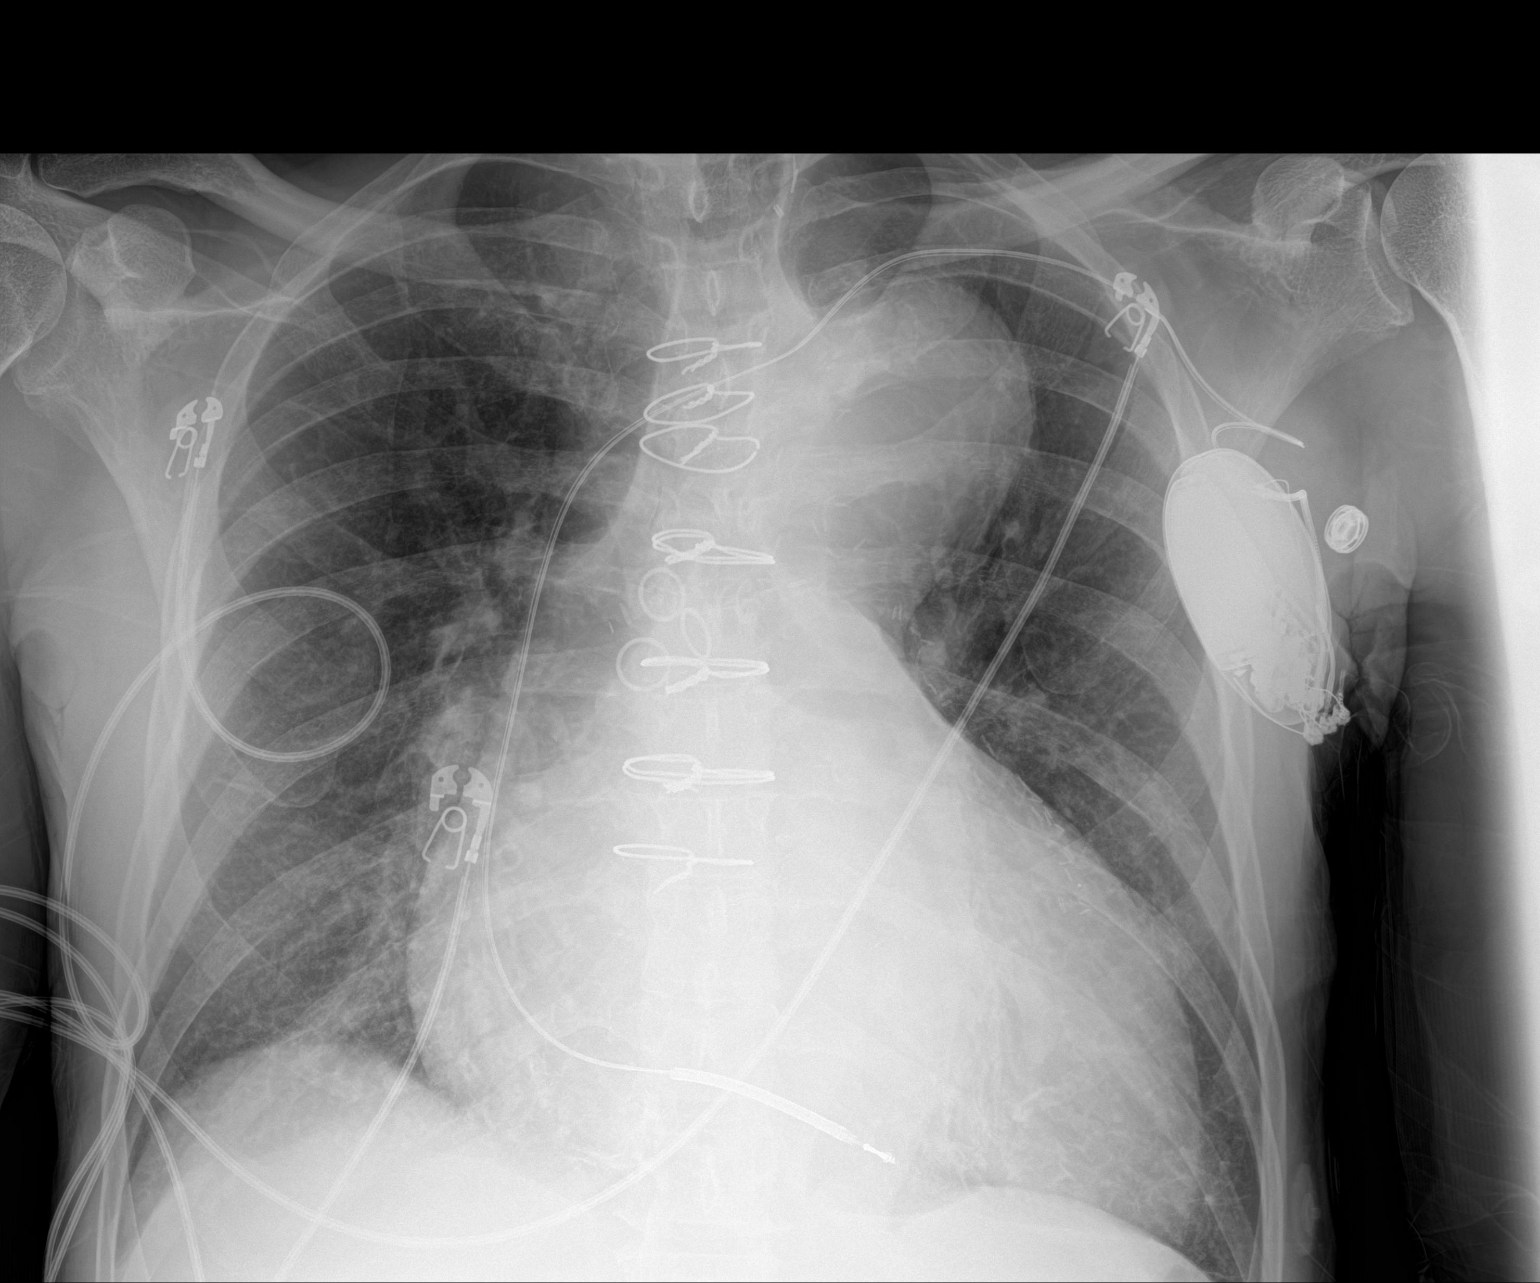
[im 2/2]
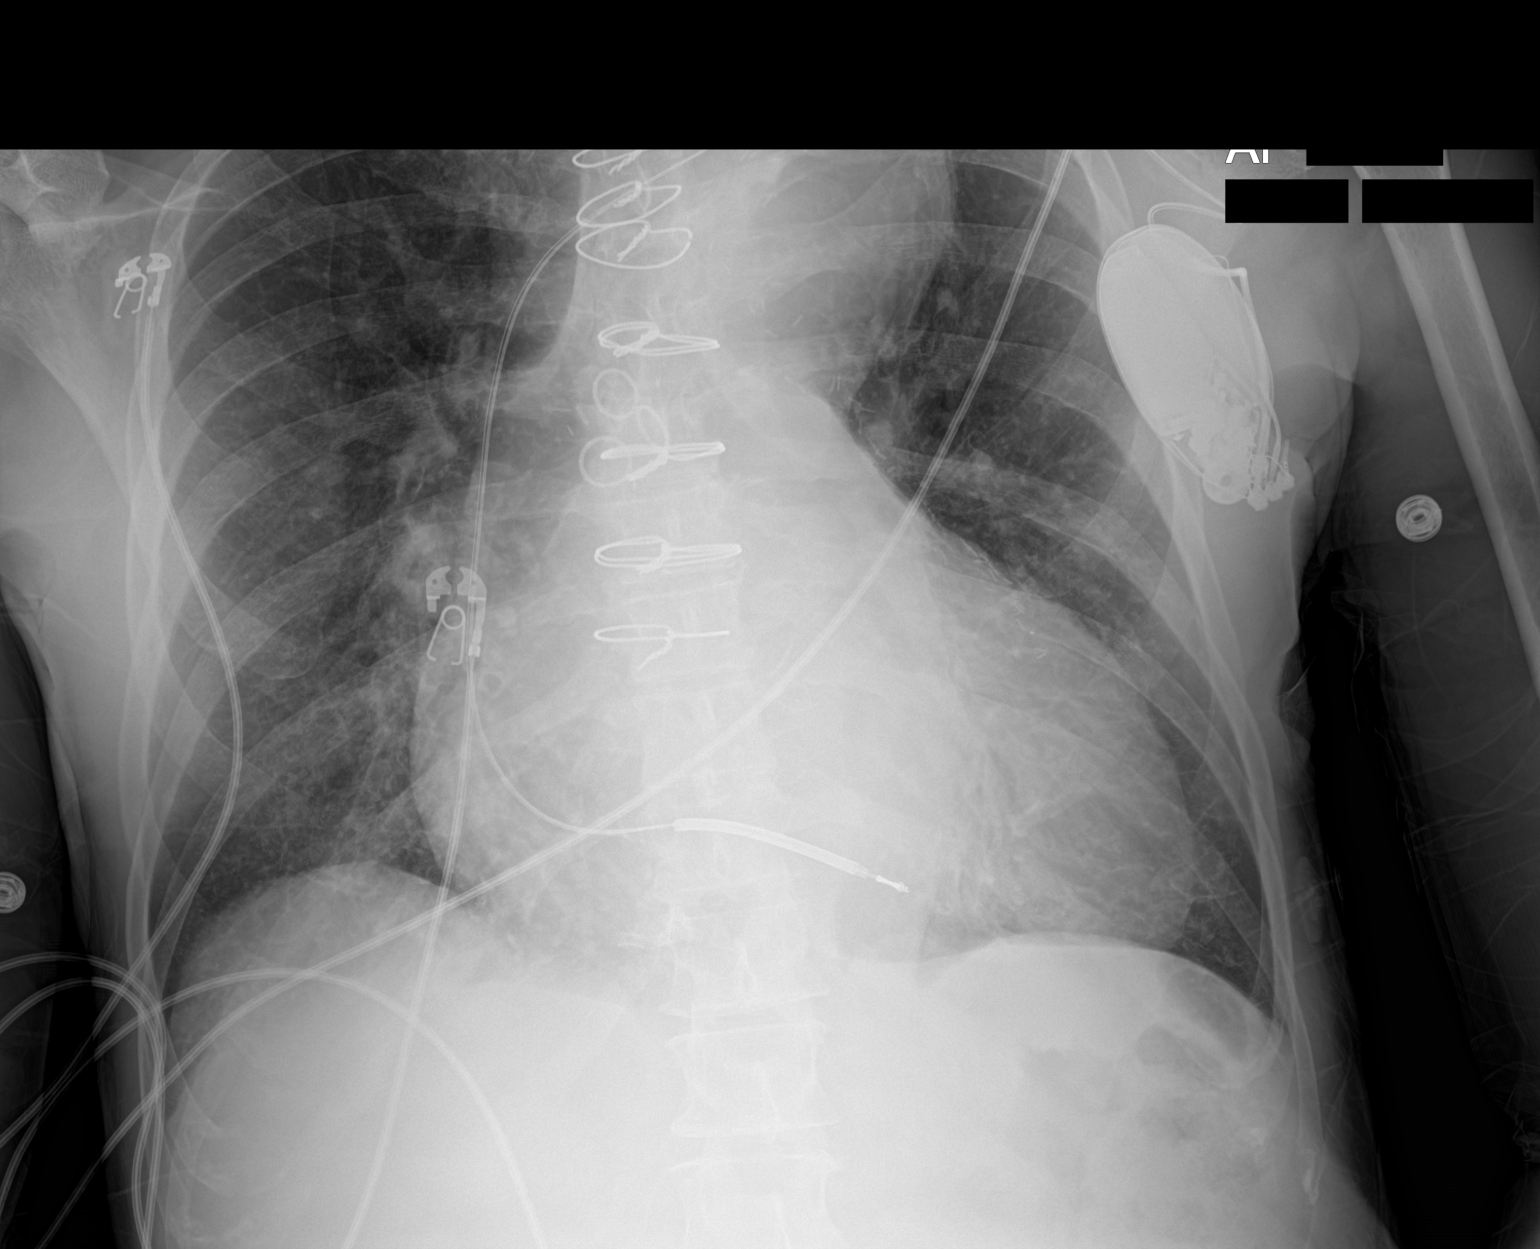

[2 of 2 positions shown; findings below may reference images not displayed]

FINDINGS: Again noted is aneurysmal dilatation of the thoracic aorta. This
appears relatively stable since prior study. There is a single lead
left-sided ICD in place. There is significant cardiomegaly. The
patient is status post prior median sternotomy. There is vascular
congestion with possible early developing pulmonary edema. There is
no pneumothorax. No large pleural effusion.
IMPRESSION: 1. Cardiomegaly with volume overload and possible early developing
pulmonary edema.
2. Again noted is aneurysmal dilatation of the thoracic aorta.

## 2020-09-21 ENCOUNTER — Telehealth: Payer: Self-pay | Admitting: *Deleted

## 2020-09-21 DIAGNOSIS — Z951 Presence of aortocoronary bypass graft: Secondary | ICD-10-CM | POA: Diagnosis not present

## 2020-09-21 DIAGNOSIS — Z95811 Presence of heart assist device: Secondary | ICD-10-CM | POA: Diagnosis not present

## 2020-09-21 DIAGNOSIS — E785 Hyperlipidemia, unspecified: Secondary | ICD-10-CM | POA: Diagnosis not present

## 2020-09-21 DIAGNOSIS — I252 Old myocardial infarction: Secondary | ICD-10-CM | POA: Diagnosis not present

## 2020-09-21 DIAGNOSIS — J45909 Unspecified asthma, uncomplicated: Secondary | ICD-10-CM | POA: Diagnosis not present

## 2020-09-21 DIAGNOSIS — Z952 Presence of prosthetic heart valve: Secondary | ICD-10-CM | POA: Diagnosis not present

## 2020-09-21 DIAGNOSIS — I5022 Chronic systolic (congestive) heart failure: Secondary | ICD-10-CM | POA: Diagnosis not present

## 2020-09-21 DIAGNOSIS — I130 Hypertensive heart and chronic kidney disease with heart failure and stage 1 through stage 4 chronic kidney disease, or unspecified chronic kidney disease: Secondary | ICD-10-CM | POA: Diagnosis not present

## 2020-09-21 DIAGNOSIS — Z95810 Presence of automatic (implantable) cardiac defibrillator: Secondary | ICD-10-CM | POA: Diagnosis not present

## 2020-09-21 DIAGNOSIS — M109 Gout, unspecified: Secondary | ICD-10-CM | POA: Diagnosis not present

## 2020-09-21 DIAGNOSIS — I340 Nonrheumatic mitral (valve) insufficiency: Secondary | ICD-10-CM | POA: Diagnosis not present

## 2020-09-21 DIAGNOSIS — Z5181 Encounter for therapeutic drug level monitoring: Secondary | ICD-10-CM | POA: Diagnosis not present

## 2020-09-21 DIAGNOSIS — G8929 Other chronic pain: Secondary | ICD-10-CM | POA: Diagnosis not present

## 2020-09-21 DIAGNOSIS — I255 Ischemic cardiomyopathy: Secondary | ICD-10-CM | POA: Diagnosis not present

## 2020-09-21 DIAGNOSIS — Z9181 History of falling: Secondary | ICD-10-CM | POA: Diagnosis not present

## 2020-09-21 DIAGNOSIS — Z7901 Long term (current) use of anticoagulants: Secondary | ICD-10-CM | POA: Diagnosis not present

## 2020-09-21 DIAGNOSIS — K219 Gastro-esophageal reflux disease without esophagitis: Secondary | ICD-10-CM | POA: Diagnosis not present

## 2020-09-21 DIAGNOSIS — N182 Chronic kidney disease, stage 2 (mild): Secondary | ICD-10-CM | POA: Diagnosis not present

## 2020-09-21 DIAGNOSIS — I2510 Atherosclerotic heart disease of native coronary artery without angina pectoris: Secondary | ICD-10-CM | POA: Diagnosis not present

## 2020-09-21 DIAGNOSIS — I5023 Acute on chronic systolic (congestive) heart failure: Secondary | ICD-10-CM | POA: Diagnosis not present

## 2020-09-21 DIAGNOSIS — D631 Anemia in chronic kidney disease: Secondary | ICD-10-CM | POA: Diagnosis not present

## 2020-09-21 DIAGNOSIS — Z7982 Long term (current) use of aspirin: Secondary | ICD-10-CM | POA: Diagnosis not present

## 2020-09-21 NOTE — Telephone Encounter (Signed)
Received fax requesting a refill on Mitigare 0.6 MG CAP QTY: 180 SIG: Take 2 capsules by mouth daily did not see this on current med list.Shaylin Blatt [employer], CMA

## 2020-09-22 ENCOUNTER — Other Ambulatory Visit: Payer: Self-pay | Admitting: Family Medicine

## 2020-09-22 MED ORDER — COLCHICINE 0.6 MG PO TABS: 1.2000 mg | ORAL_TABLET | Freq: Two times a day (BID) | ORAL | 0 refills | Status: DC

## 2020-09-22 NOTE — Telephone Encounter (Signed)
Sent med to [facility]. Looks like it is on his [facility] medication list in care everywhere for gout. Please could you arrange an appointment to meet new PCP? Thank you.

## 2020-09-24 ENCOUNTER — Other Ambulatory Visit: Payer: Medicaid Other

## 2020-09-27 ENCOUNTER — Other Ambulatory Visit: Payer: Self-pay

## 2020-09-27 DIAGNOSIS — E785 Hyperlipidemia, unspecified: Secondary | ICD-10-CM

## 2020-09-28 ENCOUNTER — Other Ambulatory Visit: Payer: Medicaid Other | Admitting: *Deleted

## 2020-09-28 ENCOUNTER — Other Ambulatory Visit: Payer: Self-pay

## 2020-09-28 DIAGNOSIS — E785 Hyperlipidemia, unspecified: Secondary | ICD-10-CM | POA: Diagnosis not present

## 2020-09-28 DIAGNOSIS — I5022 Chronic systolic (congestive) heart failure: Secondary | ICD-10-CM

## 2020-09-28 LAB — LIPID PANEL
Chol/HDL Ratio: 3.2 ratio (ref 0.0–5.0)
Cholesterol, Total: 134 mg/dL (ref 100–199)
HDL: 42 mg/dL (ref >39)
LDL Chol Calc (NIH): 74 mg/dL (ref 0–99)
Triglycerides: 98 mg/dL (ref 0–149)
VLDL Cholesterol Cal: 18 mg/dL (ref 5–40)

## 2020-09-28 NOTE — Progress Notes (Signed)
Per note on lab line Only lipids were drawn on 09/28/2020  With  [facility] as the  resulting agency

## 2020-09-28 NOTE — Telephone Encounter (Signed)
PT has appointment with PCP on 11/02/2020. Chad [name], CMA

## 2020-09-29 ENCOUNTER — Other Ambulatory Visit: Payer: Self-pay | Admitting: Family Medicine

## 2020-09-30 DIAGNOSIS — I252 Old myocardial infarction: Secondary | ICD-10-CM | POA: Diagnosis not present

## 2020-09-30 DIAGNOSIS — Z7982 Long term (current) use of aspirin: Secondary | ICD-10-CM | POA: Diagnosis not present

## 2020-09-30 DIAGNOSIS — I130 Hypertensive heart and chronic kidney disease with heart failure and stage 1 through stage 4 chronic kidney disease, or unspecified chronic kidney disease: Secondary | ICD-10-CM | POA: Diagnosis not present

## 2020-09-30 DIAGNOSIS — Z95811 Presence of heart assist device: Secondary | ICD-10-CM | POA: Diagnosis not present

## 2020-09-30 DIAGNOSIS — I5022 Chronic systolic (congestive) heart failure: Secondary | ICD-10-CM | POA: Diagnosis not present

## 2020-09-30 DIAGNOSIS — I5023 Acute on chronic systolic (congestive) heart failure: Secondary | ICD-10-CM | POA: Diagnosis not present

## 2020-09-30 DIAGNOSIS — I2510 Atherosclerotic heart disease of native coronary artery without angina pectoris: Secondary | ICD-10-CM | POA: Diagnosis not present

## 2020-09-30 DIAGNOSIS — I340 Nonrheumatic mitral (valve) insufficiency: Secondary | ICD-10-CM | POA: Diagnosis not present

## 2020-09-30 DIAGNOSIS — E785 Hyperlipidemia, unspecified: Secondary | ICD-10-CM | POA: Diagnosis not present

## 2020-09-30 DIAGNOSIS — Z5181 Encounter for therapeutic drug level monitoring: Secondary | ICD-10-CM | POA: Diagnosis not present

## 2020-09-30 DIAGNOSIS — K219 Gastro-esophageal reflux disease without esophagitis: Secondary | ICD-10-CM | POA: Diagnosis not present

## 2020-09-30 DIAGNOSIS — Z9181 History of falling: Secondary | ICD-10-CM | POA: Diagnosis not present

## 2020-09-30 DIAGNOSIS — G8929 Other chronic pain: Secondary | ICD-10-CM | POA: Diagnosis not present

## 2020-09-30 DIAGNOSIS — Z95810 Presence of automatic (implantable) cardiac defibrillator: Secondary | ICD-10-CM | POA: Diagnosis not present

## 2020-09-30 DIAGNOSIS — D631 Anemia in chronic kidney disease: Secondary | ICD-10-CM | POA: Diagnosis not present

## 2020-09-30 DIAGNOSIS — Z951 Presence of aortocoronary bypass graft: Secondary | ICD-10-CM | POA: Diagnosis not present

## 2020-09-30 DIAGNOSIS — Z7901 Long term (current) use of anticoagulants: Secondary | ICD-10-CM | POA: Diagnosis not present

## 2020-09-30 DIAGNOSIS — I255 Ischemic cardiomyopathy: Secondary | ICD-10-CM | POA: Diagnosis not present

## 2020-09-30 DIAGNOSIS — N182 Chronic kidney disease, stage 2 (mild): Secondary | ICD-10-CM | POA: Diagnosis not present

## 2020-09-30 DIAGNOSIS — Z952 Presence of prosthetic heart valve: Secondary | ICD-10-CM | POA: Diagnosis not present

## 2020-09-30 DIAGNOSIS — M109 Gout, unspecified: Secondary | ICD-10-CM | POA: Diagnosis not present

## 2020-09-30 DIAGNOSIS — J45909 Unspecified asthma, uncomplicated: Secondary | ICD-10-CM | POA: Diagnosis not present

## 2020-10-04 DIAGNOSIS — J45909 Unspecified asthma, uncomplicated: Secondary | ICD-10-CM | POA: Diagnosis not present

## 2020-10-04 DIAGNOSIS — M109 Gout, unspecified: Secondary | ICD-10-CM | POA: Diagnosis not present

## 2020-10-04 DIAGNOSIS — N990 Postprocedural (acute) (chronic) kidney failure: Secondary | ICD-10-CM | POA: Diagnosis not present

## 2020-10-04 DIAGNOSIS — N182 Chronic kidney disease, stage 2 (mild): Secondary | ICD-10-CM | POA: Diagnosis not present

## 2020-10-04 DIAGNOSIS — I25810 Atherosclerosis of coronary artery bypass graft(s) without angina pectoris: Secondary | ICD-10-CM | POA: Diagnosis not present

## 2020-10-04 DIAGNOSIS — E785 Hyperlipidemia, unspecified: Secondary | ICD-10-CM | POA: Diagnosis not present

## 2020-10-04 DIAGNOSIS — Z953 Presence of xenogenic heart valve: Secondary | ICD-10-CM | POA: Diagnosis not present

## 2020-10-04 DIAGNOSIS — I255 Ischemic cardiomyopathy: Secondary | ICD-10-CM | POA: Diagnosis not present

## 2020-10-04 DIAGNOSIS — I5023 Acute on chronic systolic (congestive) heart failure: Secondary | ICD-10-CM | POA: Diagnosis not present

## 2020-10-04 DIAGNOSIS — R197 Diarrhea, unspecified: Secondary | ICD-10-CM | POA: Diagnosis not present

## 2020-10-04 DIAGNOSIS — I712 Thoracic aortic aneurysm, without rupture: Secondary | ICD-10-CM | POA: Diagnosis not present

## 2020-10-04 DIAGNOSIS — I2510 Atherosclerotic heart disease of native coronary artery without angina pectoris: Secondary | ICD-10-CM | POA: Diagnosis not present

## 2020-10-04 DIAGNOSIS — D61818 Other pancytopenia: Secondary | ICD-10-CM | POA: Diagnosis not present

## 2020-10-04 DIAGNOSIS — I080 Rheumatic disorders of both mitral and aortic valves: Secondary | ICD-10-CM | POA: Diagnosis not present

## 2020-10-04 DIAGNOSIS — T508X5A Adverse effect of diagnostic agents, initial encounter: Secondary | ICD-10-CM | POA: Diagnosis not present

## 2020-10-04 DIAGNOSIS — Z95810 Presence of automatic (implantable) cardiac defibrillator: Secondary | ICD-10-CM | POA: Diagnosis not present

## 2020-10-04 DIAGNOSIS — J90 Pleural effusion, not elsewhere classified: Secondary | ICD-10-CM | POA: Diagnosis not present

## 2020-10-04 DIAGNOSIS — I130 Hypertensive heart and chronic kidney disease with heart failure and stage 1 through stage 4 chronic kidney disease, or unspecified chronic kidney disease: Secondary | ICD-10-CM | POA: Diagnosis not present

## 2020-10-04 DIAGNOSIS — Z20822 Contact with and (suspected) exposure to covid-19: Secondary | ICD-10-CM | POA: Diagnosis not present

## 2020-10-04 DIAGNOSIS — I517 Cardiomegaly: Secondary | ICD-10-CM | POA: Diagnosis not present

## 2020-10-04 DIAGNOSIS — K219 Gastro-esophageal reflux disease without esophagitis: Secondary | ICD-10-CM | POA: Diagnosis not present

## 2020-10-04 DIAGNOSIS — N141 Nephropathy induced by other drugs, medicaments and biological substances: Secondary | ICD-10-CM | POA: Diagnosis not present

## 2020-10-04 DIAGNOSIS — F4310 Post-traumatic stress disorder, unspecified: Secondary | ICD-10-CM | POA: Diagnosis not present

## 2020-10-04 DIAGNOSIS — I716 Thoracoabdominal aortic aneurysm, without rupture: Secondary | ICD-10-CM | POA: Diagnosis not present

## 2020-10-04 DIAGNOSIS — Z95811 Presence of heart assist device: Secondary | ICD-10-CM | POA: Diagnosis not present

## 2020-10-04 DIAGNOSIS — E876 Hypokalemia: Secondary | ICD-10-CM | POA: Diagnosis not present

## 2020-10-07 DIAGNOSIS — J90 Pleural effusion, not elsewhere classified: Secondary | ICD-10-CM | POA: Diagnosis not present

## 2020-10-07 DIAGNOSIS — I712 Thoracic aortic aneurysm, without rupture: Secondary | ICD-10-CM | POA: Diagnosis not present

## 2020-10-07 DIAGNOSIS — Z952 Presence of prosthetic heart valve: Secondary | ICD-10-CM | POA: Diagnosis not present

## 2020-10-07 DIAGNOSIS — I7103 Dissection of thoracoabdominal aorta: Secondary | ICD-10-CM | POA: Diagnosis not present

## 2020-10-07 DIAGNOSIS — J811 Chronic pulmonary edema: Secondary | ICD-10-CM | POA: Diagnosis not present

## 2020-10-07 DIAGNOSIS — I7101 Dissection of thoracic aorta: Secondary | ICD-10-CM | POA: Diagnosis not present

## 2020-10-07 DIAGNOSIS — I10 Essential (primary) hypertension: Secondary | ICD-10-CM | POA: Diagnosis not present

## 2020-10-07 DIAGNOSIS — Z951 Presence of aortocoronary bypass graft: Secondary | ICD-10-CM | POA: Diagnosis not present

## 2020-10-07 DIAGNOSIS — Z95811 Presence of heart assist device: Secondary | ICD-10-CM | POA: Diagnosis not present

## 2020-10-07 DIAGNOSIS — R29818 Other symptoms and signs involving the nervous system: Secondary | ICD-10-CM | POA: Diagnosis not present

## 2020-10-08 DIAGNOSIS — R404 Transient alteration of awareness: Secondary | ICD-10-CM | POA: Diagnosis not present

## 2020-10-08 DIAGNOSIS — R918 Other nonspecific abnormal finding of lung field: Secondary | ICD-10-CM | POA: Diagnosis not present

## 2020-10-08 DIAGNOSIS — I712 Thoracic aortic aneurysm, without rupture: Secondary | ICD-10-CM | POA: Diagnosis not present

## 2020-10-08 DIAGNOSIS — T50905A Adverse effect of unspecified drugs, medicaments and biological substances, initial encounter: Secondary | ICD-10-CM | POA: Diagnosis not present

## 2020-10-08 DIAGNOSIS — J90 Pleural effusion, not elsewhere classified: Secondary | ICD-10-CM | POA: Diagnosis not present

## 2020-10-09 DIAGNOSIS — Z8679 Personal history of other diseases of the circulatory system: Secondary | ICD-10-CM | POA: Diagnosis not present

## 2020-10-09 DIAGNOSIS — Z9889 Other specified postprocedural states: Secondary | ICD-10-CM | POA: Diagnosis not present

## 2020-10-09 DIAGNOSIS — I712 Thoracic aortic aneurysm, without rupture: Secondary | ICD-10-CM | POA: Diagnosis not present

## 2020-10-10 DIAGNOSIS — I712 Thoracic aortic aneurysm, without rupture: Secondary | ICD-10-CM | POA: Diagnosis not present

## 2020-10-11 DIAGNOSIS — Z9889 Other specified postprocedural states: Secondary | ICD-10-CM | POA: Diagnosis not present

## 2020-10-11 DIAGNOSIS — Z8679 Personal history of other diseases of the circulatory system: Secondary | ICD-10-CM | POA: Diagnosis not present

## 2020-10-12 DIAGNOSIS — Z9889 Other specified postprocedural states: Secondary | ICD-10-CM | POA: Diagnosis not present

## 2020-10-12 DIAGNOSIS — Z8679 Personal history of other diseases of the circulatory system: Secondary | ICD-10-CM | POA: Diagnosis not present

## 2020-10-13 DIAGNOSIS — Z8679 Personal history of other diseases of the circulatory system: Secondary | ICD-10-CM | POA: Diagnosis not present

## 2020-10-13 DIAGNOSIS — Z9889 Other specified postprocedural states: Secondary | ICD-10-CM | POA: Diagnosis not present

## 2020-10-18 DIAGNOSIS — Z7901 Long term (current) use of anticoagulants: Secondary | ICD-10-CM | POA: Diagnosis not present

## 2020-10-18 DIAGNOSIS — I5023 Acute on chronic systolic (congestive) heart failure: Secondary | ICD-10-CM | POA: Diagnosis not present

## 2020-10-18 DIAGNOSIS — I255 Ischemic cardiomyopathy: Secondary | ICD-10-CM | POA: Diagnosis not present

## 2020-10-18 DIAGNOSIS — I10 Essential (primary) hypertension: Secondary | ICD-10-CM | POA: Diagnosis not present

## 2020-10-20 ENCOUNTER — Other Ambulatory Visit (HOSPITAL_COMMUNITY): Payer: Self-pay | Admitting: Internal Medicine

## 2020-10-20 DIAGNOSIS — I255 Ischemic cardiomyopathy: Secondary | ICD-10-CM | POA: Diagnosis not present

## 2020-10-20 DIAGNOSIS — E785 Hyperlipidemia, unspecified: Secondary | ICD-10-CM | POA: Diagnosis not present

## 2020-10-20 DIAGNOSIS — I2510 Atherosclerotic heart disease of native coronary artery without angina pectoris: Secondary | ICD-10-CM | POA: Diagnosis not present

## 2020-10-20 DIAGNOSIS — I252 Old myocardial infarction: Secondary | ICD-10-CM | POA: Diagnosis not present

## 2020-10-20 DIAGNOSIS — G8929 Other chronic pain: Secondary | ICD-10-CM | POA: Diagnosis not present

## 2020-10-20 DIAGNOSIS — Z7901 Long term (current) use of anticoagulants: Secondary | ICD-10-CM | POA: Diagnosis not present

## 2020-10-20 DIAGNOSIS — I340 Nonrheumatic mitral (valve) insufficiency: Secondary | ICD-10-CM | POA: Diagnosis not present

## 2020-10-20 DIAGNOSIS — D631 Anemia in chronic kidney disease: Secondary | ICD-10-CM | POA: Diagnosis not present

## 2020-10-20 DIAGNOSIS — Z5181 Encounter for therapeutic drug level monitoring: Secondary | ICD-10-CM | POA: Diagnosis not present

## 2020-10-20 DIAGNOSIS — K219 Gastro-esophageal reflux disease without esophagitis: Secondary | ICD-10-CM | POA: Diagnosis not present

## 2020-10-20 DIAGNOSIS — I5022 Chronic systolic (congestive) heart failure: Secondary | ICD-10-CM | POA: Diagnosis not present

## 2020-10-20 DIAGNOSIS — J45909 Unspecified asthma, uncomplicated: Secondary | ICD-10-CM | POA: Diagnosis not present

## 2020-10-20 DIAGNOSIS — N182 Chronic kidney disease, stage 2 (mild): Secondary | ICD-10-CM | POA: Diagnosis not present

## 2020-10-20 DIAGNOSIS — Z951 Presence of aortocoronary bypass graft: Secondary | ICD-10-CM | POA: Diagnosis not present

## 2020-10-20 DIAGNOSIS — Z95810 Presence of automatic (implantable) cardiac defibrillator: Secondary | ICD-10-CM | POA: Diagnosis not present

## 2020-10-20 DIAGNOSIS — Z9181 History of falling: Secondary | ICD-10-CM | POA: Diagnosis not present

## 2020-10-20 DIAGNOSIS — Z952 Presence of prosthetic heart valve: Secondary | ICD-10-CM | POA: Diagnosis not present

## 2020-10-20 DIAGNOSIS — Z7982 Long term (current) use of aspirin: Secondary | ICD-10-CM | POA: Diagnosis not present

## 2020-10-20 DIAGNOSIS — I130 Hypertensive heart and chronic kidney disease with heart failure and stage 1 through stage 4 chronic kidney disease, or unspecified chronic kidney disease: Secondary | ICD-10-CM | POA: Diagnosis not present

## 2020-10-20 DIAGNOSIS — M109 Gout, unspecified: Secondary | ICD-10-CM | POA: Diagnosis not present

## 2020-10-25 DIAGNOSIS — I130 Hypertensive heart and chronic kidney disease with heart failure and stage 1 through stage 4 chronic kidney disease, or unspecified chronic kidney disease: Secondary | ICD-10-CM | POA: Diagnosis not present

## 2020-10-25 DIAGNOSIS — Z95810 Presence of automatic (implantable) cardiac defibrillator: Secondary | ICD-10-CM | POA: Diagnosis not present

## 2020-10-25 DIAGNOSIS — N182 Chronic kidney disease, stage 2 (mild): Secondary | ICD-10-CM | POA: Diagnosis not present

## 2020-10-25 DIAGNOSIS — D631 Anemia in chronic kidney disease: Secondary | ICD-10-CM | POA: Diagnosis not present

## 2020-10-25 DIAGNOSIS — I2510 Atherosclerotic heart disease of native coronary artery without angina pectoris: Secondary | ICD-10-CM | POA: Diagnosis not present

## 2020-10-25 DIAGNOSIS — M109 Gout, unspecified: Secondary | ICD-10-CM | POA: Diagnosis not present

## 2020-10-25 DIAGNOSIS — Z952 Presence of prosthetic heart valve: Secondary | ICD-10-CM | POA: Diagnosis not present

## 2020-10-25 DIAGNOSIS — I340 Nonrheumatic mitral (valve) insufficiency: Secondary | ICD-10-CM | POA: Diagnosis not present

## 2020-10-25 DIAGNOSIS — Z7982 Long term (current) use of aspirin: Secondary | ICD-10-CM | POA: Diagnosis not present

## 2020-10-25 DIAGNOSIS — G8929 Other chronic pain: Secondary | ICD-10-CM | POA: Diagnosis not present

## 2020-10-25 DIAGNOSIS — J45909 Unspecified asthma, uncomplicated: Secondary | ICD-10-CM | POA: Diagnosis not present

## 2020-10-25 DIAGNOSIS — I5022 Chronic systolic (congestive) heart failure: Secondary | ICD-10-CM | POA: Diagnosis not present

## 2020-10-25 DIAGNOSIS — Z951 Presence of aortocoronary bypass graft: Secondary | ICD-10-CM | POA: Diagnosis not present

## 2020-10-25 DIAGNOSIS — E785 Hyperlipidemia, unspecified: Secondary | ICD-10-CM | POA: Diagnosis not present

## 2020-10-25 DIAGNOSIS — K219 Gastro-esophageal reflux disease without esophagitis: Secondary | ICD-10-CM | POA: Diagnosis not present

## 2020-10-25 DIAGNOSIS — I255 Ischemic cardiomyopathy: Secondary | ICD-10-CM | POA: Diagnosis not present

## 2020-10-25 DIAGNOSIS — I252 Old myocardial infarction: Secondary | ICD-10-CM | POA: Diagnosis not present

## 2020-10-25 DIAGNOSIS — Z5181 Encounter for therapeutic drug level monitoring: Secondary | ICD-10-CM | POA: Diagnosis not present

## 2020-10-25 DIAGNOSIS — Z7901 Long term (current) use of anticoagulants: Secondary | ICD-10-CM | POA: Diagnosis not present

## 2020-10-25 DIAGNOSIS — Z9181 History of falling: Secondary | ICD-10-CM | POA: Diagnosis not present

## 2020-10-26 DIAGNOSIS — Z952 Presence of prosthetic heart valve: Secondary | ICD-10-CM | POA: Diagnosis not present

## 2020-10-26 DIAGNOSIS — I340 Nonrheumatic mitral (valve) insufficiency: Secondary | ICD-10-CM | POA: Diagnosis not present

## 2020-10-26 DIAGNOSIS — I252 Old myocardial infarction: Secondary | ICD-10-CM | POA: Diagnosis not present

## 2020-10-26 DIAGNOSIS — I2510 Atherosclerotic heart disease of native coronary artery without angina pectoris: Secondary | ICD-10-CM | POA: Diagnosis not present

## 2020-10-26 DIAGNOSIS — N182 Chronic kidney disease, stage 2 (mild): Secondary | ICD-10-CM | POA: Diagnosis not present

## 2020-10-26 DIAGNOSIS — I5022 Chronic systolic (congestive) heart failure: Secondary | ICD-10-CM | POA: Diagnosis not present

## 2020-10-26 DIAGNOSIS — Z7901 Long term (current) use of anticoagulants: Secondary | ICD-10-CM | POA: Diagnosis not present

## 2020-10-26 DIAGNOSIS — Z5181 Encounter for therapeutic drug level monitoring: Secondary | ICD-10-CM | POA: Diagnosis not present

## 2020-10-26 DIAGNOSIS — Z951 Presence of aortocoronary bypass graft: Secondary | ICD-10-CM | POA: Diagnosis not present

## 2020-10-26 DIAGNOSIS — Z7982 Long term (current) use of aspirin: Secondary | ICD-10-CM | POA: Diagnosis not present

## 2020-10-26 DIAGNOSIS — K219 Gastro-esophageal reflux disease without esophagitis: Secondary | ICD-10-CM | POA: Diagnosis not present

## 2020-10-26 DIAGNOSIS — Z95810 Presence of automatic (implantable) cardiac defibrillator: Secondary | ICD-10-CM | POA: Diagnosis not present

## 2020-10-26 DIAGNOSIS — Z95811 Presence of heart assist device: Secondary | ICD-10-CM | POA: Diagnosis not present

## 2020-10-26 DIAGNOSIS — J45909 Unspecified asthma, uncomplicated: Secondary | ICD-10-CM | POA: Diagnosis not present

## 2020-10-26 DIAGNOSIS — I255 Ischemic cardiomyopathy: Secondary | ICD-10-CM | POA: Diagnosis not present

## 2020-10-26 DIAGNOSIS — G8929 Other chronic pain: Secondary | ICD-10-CM | POA: Diagnosis not present

## 2020-10-26 DIAGNOSIS — E785 Hyperlipidemia, unspecified: Secondary | ICD-10-CM | POA: Diagnosis not present

## 2020-10-26 DIAGNOSIS — I5023 Acute on chronic systolic (congestive) heart failure: Secondary | ICD-10-CM | POA: Diagnosis not present

## 2020-10-26 DIAGNOSIS — Z9181 History of falling: Secondary | ICD-10-CM | POA: Diagnosis not present

## 2020-10-26 DIAGNOSIS — M109 Gout, unspecified: Secondary | ICD-10-CM | POA: Diagnosis not present

## 2020-10-26 DIAGNOSIS — D631 Anemia in chronic kidney disease: Secondary | ICD-10-CM | POA: Diagnosis not present

## 2020-10-26 DIAGNOSIS — I130 Hypertensive heart and chronic kidney disease with heart failure and stage 1 through stage 4 chronic kidney disease, or unspecified chronic kidney disease: Secondary | ICD-10-CM | POA: Diagnosis not present

## 2020-11-01 DIAGNOSIS — Z85850 Personal history of malignant neoplasm of thyroid: Secondary | ICD-10-CM | POA: Diagnosis not present

## 2020-11-01 DIAGNOSIS — Z48812 Encounter for surgical aftercare following surgery on the circulatory system: Secondary | ICD-10-CM | POA: Diagnosis not present

## 2020-11-01 DIAGNOSIS — Z5181 Encounter for therapeutic drug level monitoring: Secondary | ICD-10-CM | POA: Diagnosis not present

## 2020-11-01 DIAGNOSIS — Z95818 Presence of other cardiac implants and grafts: Secondary | ICD-10-CM | POA: Diagnosis not present

## 2020-11-01 DIAGNOSIS — E785 Hyperlipidemia, unspecified: Secondary | ICD-10-CM | POA: Diagnosis not present

## 2020-11-01 DIAGNOSIS — Z7982 Long term (current) use of aspirin: Secondary | ICD-10-CM | POA: Diagnosis not present

## 2020-11-01 DIAGNOSIS — G8929 Other chronic pain: Secondary | ICD-10-CM | POA: Diagnosis not present

## 2020-11-01 DIAGNOSIS — Z7901 Long term (current) use of anticoagulants: Secondary | ICD-10-CM | POA: Diagnosis not present

## 2020-11-01 DIAGNOSIS — M5431 Sciatica, right side: Secondary | ICD-10-CM | POA: Diagnosis not present

## 2020-11-01 DIAGNOSIS — Z952 Presence of prosthetic heart valve: Secondary | ICD-10-CM | POA: Diagnosis not present

## 2020-11-01 DIAGNOSIS — Z79891 Long term (current) use of opiate analgesic: Secondary | ICD-10-CM | POA: Diagnosis not present

## 2020-11-01 DIAGNOSIS — Z951 Presence of aortocoronary bypass graft: Secondary | ICD-10-CM | POA: Diagnosis not present

## 2020-11-01 DIAGNOSIS — I5022 Chronic systolic (congestive) heart failure: Secondary | ICD-10-CM | POA: Diagnosis not present

## 2020-11-01 DIAGNOSIS — I252 Old myocardial infarction: Secondary | ICD-10-CM | POA: Diagnosis not present

## 2020-11-01 DIAGNOSIS — M109 Gout, unspecified: Secondary | ICD-10-CM | POA: Diagnosis not present

## 2020-11-01 DIAGNOSIS — I5023 Acute on chronic systolic (congestive) heart failure: Secondary | ICD-10-CM | POA: Diagnosis not present

## 2020-11-01 DIAGNOSIS — I340 Nonrheumatic mitral (valve) insufficiency: Secondary | ICD-10-CM | POA: Diagnosis not present

## 2020-11-01 DIAGNOSIS — Z95810 Presence of automatic (implantable) cardiac defibrillator: Secondary | ICD-10-CM | POA: Diagnosis not present

## 2020-11-01 DIAGNOSIS — K219 Gastro-esophageal reflux disease without esophagitis: Secondary | ICD-10-CM | POA: Diagnosis not present

## 2020-11-01 DIAGNOSIS — I2510 Atherosclerotic heart disease of native coronary artery without angina pectoris: Secondary | ICD-10-CM | POA: Diagnosis not present

## 2020-11-01 DIAGNOSIS — J45909 Unspecified asthma, uncomplicated: Secondary | ICD-10-CM | POA: Diagnosis not present

## 2020-11-01 DIAGNOSIS — D631 Anemia in chronic kidney disease: Secondary | ICD-10-CM | POA: Diagnosis not present

## 2020-11-01 DIAGNOSIS — Z9181 History of falling: Secondary | ICD-10-CM | POA: Diagnosis not present

## 2020-11-01 DIAGNOSIS — I255 Ischemic cardiomyopathy: Secondary | ICD-10-CM | POA: Diagnosis not present

## 2020-11-01 DIAGNOSIS — I130 Hypertensive heart and chronic kidney disease with heart failure and stage 1 through stage 4 chronic kidney disease, or unspecified chronic kidney disease: Secondary | ICD-10-CM | POA: Diagnosis not present

## 2020-11-01 DIAGNOSIS — F064 Anxiety disorder due to known physiological condition: Secondary | ICD-10-CM | POA: Diagnosis not present

## 2020-11-01 DIAGNOSIS — N182 Chronic kidney disease, stage 2 (mild): Secondary | ICD-10-CM | POA: Diagnosis not present

## 2020-11-01 NOTE — Assessment & Plan Note (Deleted)
Obtained A1c today.  

## 2020-11-01 NOTE — Progress Notes (Signed)
    SUBJECTIVE:   CHIEF COMPLAINT / HPI:   Chad [name] is a 59 yr old male who presents today for concern for groin   Groin hematoma  Pt was told my vascular specialist to follow up with PCP regarding groin hematoma. Underwent TEVAR 10/14 and discharged on 10/21. He was noted to have a tender hematoma in left groin which has increased in size towards discharge.    OBJECTIVE:   Pulse 72   Ht 5\' 11"  (1.803 m)   Wt 173 lb 6.4 oz (78.7 kg)   SpO2 98%   BMI 24.18 kg/m    General: Alert, no acute distress, appears stated age  Cardio: well perfused  Pulm: normal WOB  Groin exam chaperoned by CMA Sharrie: 5cm non tender, non mobile, non fluctuant, firm mass in left groin. No overlying erythema. Incision sites bilaterally healing well Neuro: Cranial nerves grossly intact  ASSESSMENT/PLAN:   Inappropriate behavior Unfortunately despite my best efforts to engage with patient and build a rapport, he did not want answer questions about his medical history and he was rude to me. "I do not need to tell you what medications I am taking, my specialists prescribe them." He also was inappropriate towards me and said "you are a beautiful woman, and I am sure you have a beautiful spirit but I do not need your care, I have specialists for all of this". "I could have told you that" etc. He also refused labs and manual BP and said to the CMA  "your manual BP cuff wont be as good as the one at home". He then asked me to examine his groin area but did not want to tell me further details about the recent TEVAR and medical history. -Transfer care to male provider, no male providers  -Follow up with specialists     Lattie Haw, MD   PGY-2, [place]

## 2020-11-01 NOTE — Assessment & Plan Note (Deleted)
Obtained cholesterol panel today. Goal LDL<70. Continue Atorvastatin **

## 2020-11-02 ENCOUNTER — Encounter: Payer: Self-pay | Admitting: Family Medicine

## 2020-11-02 ENCOUNTER — Other Ambulatory Visit: Payer: Self-pay

## 2020-11-02 ENCOUNTER — Ambulatory Visit (INDEPENDENT_AMBULATORY_CARE_PROVIDER_SITE_OTHER): Payer: Medicaid Other | Admitting: Family Medicine

## 2020-11-02 VITALS — HR 72 | Ht 71.0 in | Wt 173.4 lb

## 2020-11-02 DIAGNOSIS — E785 Hyperlipidemia, unspecified: Secondary | ICD-10-CM | POA: Diagnosis not present

## 2020-11-02 DIAGNOSIS — R062 Wheezing: Secondary | ICD-10-CM | POA: Diagnosis present

## 2020-11-02 DIAGNOSIS — F99 Mental disorder, not otherwise specified: Secondary | ICD-10-CM | POA: Diagnosis not present

## 2020-11-02 DIAGNOSIS — R7303 Prediabetes: Secondary | ICD-10-CM

## 2020-11-02 MED ORDER — ALBUTEROL SULFATE HFA 108 (90 BASE) MCG/ACT IN AERS: 2.0000 | INHALATION_SPRAY | Freq: Once | RESPIRATORY_TRACT | Status: DC

## 2020-11-02 NOTE — Patient Instructions (Signed)
Great to see you today! The incision sites in the groin look like they are healing well. The right side has lumps and I am not sure what they are are. They could be scar tissue or a hematoma or something else. I would recommend following up with your specialist.  Do not hesitate to contact me if you have further questions.  Best wishes  Dr Posey Pronto

## 2020-11-04 ENCOUNTER — Other Ambulatory Visit: Payer: Self-pay | Admitting: *Deleted

## 2020-11-04 DIAGNOSIS — F99 Mental disorder, not otherwise specified: Secondary | ICD-10-CM | POA: Insufficient documentation

## 2020-11-04 MED ORDER — MITIGARE 0.6 MG PO CAPS
2.0000 | ORAL_CAPSULE | Freq: Two times a day (BID) | ORAL | 0 refills | Status: DC
Start: 2020-11-04 — End: 2021-03-07

## 2020-11-04 NOTE — Assessment & Plan Note (Addendum)
Unfortunately despite my best efforts to engage with patient and build a rapport, he did not want answer questions about his medical history and he was rude to me. "I do not need to tell you what medications I am taking, my specialists prescribe them." He also was inappropriate towards me and said "you are a beautiful woman, and I am sure you have a beautiful spirit but I do not need your care, I have specialists for all of this". "I could have told you that" etc. He also refused labs and manual BP and said to the CMA  "your manual BP cuff wont be as good as the one at home". He then asked me to examine his groin area but did not want to tell me further details about the recent TEVAR and medical history. -Transfer care to male provider, no male providers  -Follow up with specialists

## 2020-11-05 ENCOUNTER — Telehealth: Payer: Self-pay | Admitting: Family Medicine

## 2020-11-05 NOTE — Telephone Encounter (Signed)
I contacted Ms. Devine regarding his PCP report of inappropriate conduct. The patient stated that he did not mean to be rude by his comment that he was trying to be nice. He was upset that his PCP misunderstood him. He requested an appropriate PCP switch. I will cycle back to Dr. Posey Pronto, as this sounds like a genuine miscommunication. He was appreciative of the call.

## 2020-11-08 ENCOUNTER — Ambulatory Visit (INDEPENDENT_AMBULATORY_CARE_PROVIDER_SITE_OTHER): Payer: Medicaid Other

## 2020-11-08 DIAGNOSIS — Z95810 Presence of automatic (implantable) cardiac defibrillator: Secondary | ICD-10-CM | POA: Diagnosis not present

## 2020-11-08 LAB — CUP PACEART REMOTE DEVICE CHECK
Battery Remaining Longevity: 60 mo
Battery Remaining Percentage: 64 %
Brady Statistic RV Percent Paced: 0 %
Date Time Interrogation Session: 20211115032100
HighPow Impedance: 44 Ohm
Implantable Lead Implant Date: 20171114
Implantable Lead Location: 753860
Implantable Lead Model: 181
Implantable Lead Serial Number: 333496
Implantable Pulse Generator Implant Date: 20131220
Lead Channel Impedance Value: 418 Ohm
Lead Channel Pacing Threshold Amplitude: 0.6 V
Lead Channel Pacing Threshold Pulse Width: 0.4 ms
Lead Channel Setting Pacing Amplitude: 2 V
Lead Channel Setting Pacing Pulse Width: 0.4 ms
Lead Channel Setting Sensing Sensitivity: 0.5 mV
Pulse Gen Serial Number: 124654

## 2020-11-08 NOTE — Telephone Encounter (Signed)
Thank you for the update Dr Gwendlyn Deutscher. I definitely agreed that he should be seen by male providers only, preferably PGY2 or PGY3.

## 2020-11-09 DIAGNOSIS — Z85850 Personal history of malignant neoplasm of thyroid: Secondary | ICD-10-CM | POA: Diagnosis not present

## 2020-11-09 DIAGNOSIS — I5023 Acute on chronic systolic (congestive) heart failure: Secondary | ICD-10-CM | POA: Diagnosis not present

## 2020-11-09 DIAGNOSIS — K219 Gastro-esophageal reflux disease without esophagitis: Secondary | ICD-10-CM | POA: Diagnosis not present

## 2020-11-09 DIAGNOSIS — Z95811 Presence of heart assist device: Secondary | ICD-10-CM | POA: Diagnosis not present

## 2020-11-09 DIAGNOSIS — Z9181 History of falling: Secondary | ICD-10-CM | POA: Diagnosis not present

## 2020-11-09 DIAGNOSIS — Z48812 Encounter for surgical aftercare following surgery on the circulatory system: Secondary | ICD-10-CM | POA: Diagnosis not present

## 2020-11-09 DIAGNOSIS — Z7901 Long term (current) use of anticoagulants: Secondary | ICD-10-CM | POA: Diagnosis not present

## 2020-11-09 DIAGNOSIS — I5022 Chronic systolic (congestive) heart failure: Secondary | ICD-10-CM | POA: Diagnosis not present

## 2020-11-09 DIAGNOSIS — N182 Chronic kidney disease, stage 2 (mild): Secondary | ICD-10-CM | POA: Diagnosis not present

## 2020-11-09 DIAGNOSIS — I130 Hypertensive heart and chronic kidney disease with heart failure and stage 1 through stage 4 chronic kidney disease, or unspecified chronic kidney disease: Secondary | ICD-10-CM | POA: Diagnosis not present

## 2020-11-09 DIAGNOSIS — J45909 Unspecified asthma, uncomplicated: Secondary | ICD-10-CM | POA: Diagnosis not present

## 2020-11-09 DIAGNOSIS — I252 Old myocardial infarction: Secondary | ICD-10-CM | POA: Diagnosis not present

## 2020-11-09 DIAGNOSIS — M5431 Sciatica, right side: Secondary | ICD-10-CM | POA: Diagnosis not present

## 2020-11-09 DIAGNOSIS — Z7982 Long term (current) use of aspirin: Secondary | ICD-10-CM | POA: Diagnosis not present

## 2020-11-09 DIAGNOSIS — Z951 Presence of aortocoronary bypass graft: Secondary | ICD-10-CM | POA: Diagnosis not present

## 2020-11-09 DIAGNOSIS — I255 Ischemic cardiomyopathy: Secondary | ICD-10-CM | POA: Diagnosis not present

## 2020-11-09 DIAGNOSIS — Z952 Presence of prosthetic heart valve: Secondary | ICD-10-CM | POA: Diagnosis not present

## 2020-11-09 DIAGNOSIS — Z5181 Encounter for therapeutic drug level monitoring: Secondary | ICD-10-CM | POA: Diagnosis not present

## 2020-11-09 DIAGNOSIS — E785 Hyperlipidemia, unspecified: Secondary | ICD-10-CM | POA: Diagnosis not present

## 2020-11-09 DIAGNOSIS — F064 Anxiety disorder due to known physiological condition: Secondary | ICD-10-CM | POA: Diagnosis not present

## 2020-11-09 DIAGNOSIS — M109 Gout, unspecified: Secondary | ICD-10-CM | POA: Diagnosis not present

## 2020-11-09 DIAGNOSIS — Z95810 Presence of automatic (implantable) cardiac defibrillator: Secondary | ICD-10-CM | POA: Diagnosis not present

## 2020-11-09 DIAGNOSIS — D631 Anemia in chronic kidney disease: Secondary | ICD-10-CM | POA: Diagnosis not present

## 2020-11-09 DIAGNOSIS — I2510 Atherosclerotic heart disease of native coronary artery without angina pectoris: Secondary | ICD-10-CM | POA: Diagnosis not present

## 2020-11-09 DIAGNOSIS — Z79891 Long term (current) use of opiate analgesic: Secondary | ICD-10-CM | POA: Diagnosis not present

## 2020-11-09 DIAGNOSIS — I340 Nonrheumatic mitral (valve) insufficiency: Secondary | ICD-10-CM | POA: Diagnosis not present

## 2020-11-09 DIAGNOSIS — G8929 Other chronic pain: Secondary | ICD-10-CM | POA: Diagnosis not present

## 2020-11-09 NOTE — Progress Notes (Signed)
Remote ICD transmission.   

## 2020-11-15 DIAGNOSIS — I5023 Acute on chronic systolic (congestive) heart failure: Secondary | ICD-10-CM | POA: Diagnosis not present

## 2020-11-15 DIAGNOSIS — Z95811 Presence of heart assist device: Secondary | ICD-10-CM | POA: Diagnosis not present

## 2020-11-15 DIAGNOSIS — Z23 Encounter for immunization: Secondary | ICD-10-CM | POA: Diagnosis not present

## 2020-11-23 DIAGNOSIS — Z7901 Long term (current) use of anticoagulants: Secondary | ICD-10-CM | POA: Diagnosis not present

## 2020-11-23 DIAGNOSIS — Z952 Presence of prosthetic heart valve: Secondary | ICD-10-CM | POA: Diagnosis not present

## 2020-11-23 DIAGNOSIS — I130 Hypertensive heart and chronic kidney disease with heart failure and stage 1 through stage 4 chronic kidney disease, or unspecified chronic kidney disease: Secondary | ICD-10-CM | POA: Diagnosis not present

## 2020-11-23 DIAGNOSIS — I340 Nonrheumatic mitral (valve) insufficiency: Secondary | ICD-10-CM | POA: Diagnosis not present

## 2020-11-23 DIAGNOSIS — Z79891 Long term (current) use of opiate analgesic: Secondary | ICD-10-CM | POA: Diagnosis not present

## 2020-11-23 DIAGNOSIS — I5022 Chronic systolic (congestive) heart failure: Secondary | ICD-10-CM | POA: Diagnosis not present

## 2020-11-23 DIAGNOSIS — N182 Chronic kidney disease, stage 2 (mild): Secondary | ICD-10-CM | POA: Diagnosis not present

## 2020-11-23 DIAGNOSIS — Z7982 Long term (current) use of aspirin: Secondary | ICD-10-CM | POA: Diagnosis not present

## 2020-11-23 DIAGNOSIS — Z95810 Presence of automatic (implantable) cardiac defibrillator: Secondary | ICD-10-CM | POA: Diagnosis not present

## 2020-11-23 DIAGNOSIS — F064 Anxiety disorder due to known physiological condition: Secondary | ICD-10-CM | POA: Diagnosis not present

## 2020-11-23 DIAGNOSIS — Z9181 History of falling: Secondary | ICD-10-CM | POA: Diagnosis not present

## 2020-11-23 DIAGNOSIS — Z48812 Encounter for surgical aftercare following surgery on the circulatory system: Secondary | ICD-10-CM | POA: Diagnosis not present

## 2020-11-23 DIAGNOSIS — G8929 Other chronic pain: Secondary | ICD-10-CM | POA: Diagnosis not present

## 2020-11-23 DIAGNOSIS — K219 Gastro-esophageal reflux disease without esophagitis: Secondary | ICD-10-CM | POA: Diagnosis not present

## 2020-11-23 DIAGNOSIS — Z85850 Personal history of malignant neoplasm of thyroid: Secondary | ICD-10-CM | POA: Diagnosis not present

## 2020-11-23 DIAGNOSIS — D631 Anemia in chronic kidney disease: Secondary | ICD-10-CM | POA: Diagnosis not present

## 2020-11-23 DIAGNOSIS — I2510 Atherosclerotic heart disease of native coronary artery without angina pectoris: Secondary | ICD-10-CM | POA: Diagnosis not present

## 2020-11-23 DIAGNOSIS — M5431 Sciatica, right side: Secondary | ICD-10-CM | POA: Diagnosis not present

## 2020-11-23 DIAGNOSIS — I252 Old myocardial infarction: Secondary | ICD-10-CM | POA: Diagnosis not present

## 2020-11-23 DIAGNOSIS — J45909 Unspecified asthma, uncomplicated: Secondary | ICD-10-CM | POA: Diagnosis not present

## 2020-11-23 DIAGNOSIS — Z5181 Encounter for therapeutic drug level monitoring: Secondary | ICD-10-CM | POA: Diagnosis not present

## 2020-11-23 DIAGNOSIS — E785 Hyperlipidemia, unspecified: Secondary | ICD-10-CM | POA: Diagnosis not present

## 2020-11-23 DIAGNOSIS — Z951 Presence of aortocoronary bypass graft: Secondary | ICD-10-CM | POA: Diagnosis not present

## 2020-11-23 DIAGNOSIS — I255 Ischemic cardiomyopathy: Secondary | ICD-10-CM | POA: Diagnosis not present

## 2020-11-23 DIAGNOSIS — M109 Gout, unspecified: Secondary | ICD-10-CM | POA: Diagnosis not present

## 2020-11-29 DIAGNOSIS — M5431 Sciatica, right side: Secondary | ICD-10-CM | POA: Diagnosis not present

## 2020-11-29 DIAGNOSIS — I5022 Chronic systolic (congestive) heart failure: Secondary | ICD-10-CM | POA: Diagnosis not present

## 2020-11-29 DIAGNOSIS — Z5181 Encounter for therapeutic drug level monitoring: Secondary | ICD-10-CM | POA: Diagnosis not present

## 2020-11-29 DIAGNOSIS — J45909 Unspecified asthma, uncomplicated: Secondary | ICD-10-CM | POA: Diagnosis not present

## 2020-11-29 DIAGNOSIS — K219 Gastro-esophageal reflux disease without esophagitis: Secondary | ICD-10-CM | POA: Diagnosis not present

## 2020-11-29 DIAGNOSIS — F064 Anxiety disorder due to known physiological condition: Secondary | ICD-10-CM | POA: Diagnosis not present

## 2020-11-29 DIAGNOSIS — I5023 Acute on chronic systolic (congestive) heart failure: Secondary | ICD-10-CM | POA: Diagnosis not present

## 2020-11-29 DIAGNOSIS — Z95811 Presence of heart assist device: Secondary | ICD-10-CM | POA: Diagnosis not present

## 2020-11-29 DIAGNOSIS — E785 Hyperlipidemia, unspecified: Secondary | ICD-10-CM | POA: Diagnosis not present

## 2020-11-29 DIAGNOSIS — I130 Hypertensive heart and chronic kidney disease with heart failure and stage 1 through stage 4 chronic kidney disease, or unspecified chronic kidney disease: Secondary | ICD-10-CM | POA: Diagnosis not present

## 2020-11-29 DIAGNOSIS — Z952 Presence of prosthetic heart valve: Secondary | ICD-10-CM | POA: Diagnosis not present

## 2020-11-29 DIAGNOSIS — I255 Ischemic cardiomyopathy: Secondary | ICD-10-CM | POA: Diagnosis not present

## 2020-11-29 DIAGNOSIS — Z951 Presence of aortocoronary bypass graft: Secondary | ICD-10-CM | POA: Diagnosis not present

## 2020-11-29 DIAGNOSIS — I340 Nonrheumatic mitral (valve) insufficiency: Secondary | ICD-10-CM | POA: Diagnosis not present

## 2020-11-29 DIAGNOSIS — Z79891 Long term (current) use of opiate analgesic: Secondary | ICD-10-CM | POA: Diagnosis not present

## 2020-11-29 DIAGNOSIS — Z95810 Presence of automatic (implantable) cardiac defibrillator: Secondary | ICD-10-CM | POA: Diagnosis not present

## 2020-11-29 DIAGNOSIS — Z48812 Encounter for surgical aftercare following surgery on the circulatory system: Secondary | ICD-10-CM | POA: Diagnosis not present

## 2020-11-29 DIAGNOSIS — G8929 Other chronic pain: Secondary | ICD-10-CM | POA: Diagnosis not present

## 2020-11-29 DIAGNOSIS — M109 Gout, unspecified: Secondary | ICD-10-CM | POA: Diagnosis not present

## 2020-11-29 DIAGNOSIS — Z9181 History of falling: Secondary | ICD-10-CM | POA: Diagnosis not present

## 2020-11-29 DIAGNOSIS — Z7901 Long term (current) use of anticoagulants: Secondary | ICD-10-CM | POA: Diagnosis not present

## 2020-11-29 DIAGNOSIS — I252 Old myocardial infarction: Secondary | ICD-10-CM | POA: Diagnosis not present

## 2020-11-29 DIAGNOSIS — Z7982 Long term (current) use of aspirin: Secondary | ICD-10-CM | POA: Diagnosis not present

## 2020-11-29 DIAGNOSIS — D631 Anemia in chronic kidney disease: Secondary | ICD-10-CM | POA: Diagnosis not present

## 2020-11-29 DIAGNOSIS — Z85850 Personal history of malignant neoplasm of thyroid: Secondary | ICD-10-CM | POA: Diagnosis not present

## 2020-11-29 DIAGNOSIS — I2510 Atherosclerotic heart disease of native coronary artery without angina pectoris: Secondary | ICD-10-CM | POA: Diagnosis not present

## 2020-11-29 DIAGNOSIS — N182 Chronic kidney disease, stage 2 (mild): Secondary | ICD-10-CM | POA: Diagnosis not present

## 2020-11-30 ENCOUNTER — Other Ambulatory Visit: Payer: Self-pay

## 2020-12-01 NOTE — Telephone Encounter (Signed)
Called patient and scheduled appointment for med refills.

## 2020-12-01 NOTE — Telephone Encounter (Signed)
Please call patient to schedule appointment to discuss his request for allopurinol as it is not on his med list.  Thank you!  Arvon Schreiner

## 2020-12-08 ENCOUNTER — Ambulatory Visit: Payer: Medicaid Other | Admitting: Family Medicine

## 2020-12-08 DIAGNOSIS — I252 Old myocardial infarction: Secondary | ICD-10-CM | POA: Diagnosis not present

## 2020-12-08 DIAGNOSIS — F064 Anxiety disorder due to known physiological condition: Secondary | ICD-10-CM | POA: Diagnosis not present

## 2020-12-08 DIAGNOSIS — K219 Gastro-esophageal reflux disease without esophagitis: Secondary | ICD-10-CM | POA: Diagnosis not present

## 2020-12-08 DIAGNOSIS — J45909 Unspecified asthma, uncomplicated: Secondary | ICD-10-CM | POA: Diagnosis not present

## 2020-12-08 DIAGNOSIS — Z95811 Presence of heart assist device: Secondary | ICD-10-CM | POA: Diagnosis not present

## 2020-12-08 DIAGNOSIS — M5431 Sciatica, right side: Secondary | ICD-10-CM | POA: Diagnosis not present

## 2020-12-08 DIAGNOSIS — Z48812 Encounter for surgical aftercare following surgery on the circulatory system: Secondary | ICD-10-CM | POA: Diagnosis not present

## 2020-12-08 DIAGNOSIS — Z952 Presence of prosthetic heart valve: Secondary | ICD-10-CM | POA: Diagnosis not present

## 2020-12-08 DIAGNOSIS — Z951 Presence of aortocoronary bypass graft: Secondary | ICD-10-CM | POA: Diagnosis not present

## 2020-12-08 DIAGNOSIS — Z5181 Encounter for therapeutic drug level monitoring: Secondary | ICD-10-CM | POA: Diagnosis not present

## 2020-12-08 DIAGNOSIS — I2510 Atherosclerotic heart disease of native coronary artery without angina pectoris: Secondary | ICD-10-CM | POA: Diagnosis not present

## 2020-12-08 DIAGNOSIS — Z95810 Presence of automatic (implantable) cardiac defibrillator: Secondary | ICD-10-CM | POA: Diagnosis not present

## 2020-12-08 DIAGNOSIS — Z7901 Long term (current) use of anticoagulants: Secondary | ICD-10-CM | POA: Diagnosis not present

## 2020-12-08 DIAGNOSIS — N182 Chronic kidney disease, stage 2 (mild): Secondary | ICD-10-CM | POA: Diagnosis not present

## 2020-12-08 DIAGNOSIS — Z85850 Personal history of malignant neoplasm of thyroid: Secondary | ICD-10-CM | POA: Diagnosis not present

## 2020-12-08 DIAGNOSIS — G8929 Other chronic pain: Secondary | ICD-10-CM | POA: Diagnosis not present

## 2020-12-08 DIAGNOSIS — I5023 Acute on chronic systolic (congestive) heart failure: Secondary | ICD-10-CM | POA: Diagnosis not present

## 2020-12-08 DIAGNOSIS — Z7982 Long term (current) use of aspirin: Secondary | ICD-10-CM | POA: Diagnosis not present

## 2020-12-08 DIAGNOSIS — I255 Ischemic cardiomyopathy: Secondary | ICD-10-CM | POA: Diagnosis not present

## 2020-12-08 DIAGNOSIS — I130 Hypertensive heart and chronic kidney disease with heart failure and stage 1 through stage 4 chronic kidney disease, or unspecified chronic kidney disease: Secondary | ICD-10-CM | POA: Diagnosis not present

## 2020-12-08 DIAGNOSIS — E785 Hyperlipidemia, unspecified: Secondary | ICD-10-CM | POA: Diagnosis not present

## 2020-12-08 DIAGNOSIS — Z79891 Long term (current) use of opiate analgesic: Secondary | ICD-10-CM | POA: Diagnosis not present

## 2020-12-08 DIAGNOSIS — I340 Nonrheumatic mitral (valve) insufficiency: Secondary | ICD-10-CM | POA: Diagnosis not present

## 2020-12-08 DIAGNOSIS — M109 Gout, unspecified: Secondary | ICD-10-CM | POA: Diagnosis not present

## 2020-12-08 DIAGNOSIS — D631 Anemia in chronic kidney disease: Secondary | ICD-10-CM | POA: Diagnosis not present

## 2020-12-08 DIAGNOSIS — I5022 Chronic systolic (congestive) heart failure: Secondary | ICD-10-CM | POA: Diagnosis not present

## 2020-12-08 DIAGNOSIS — Z9181 History of falling: Secondary | ICD-10-CM | POA: Diagnosis not present

## 2020-12-12 DIAGNOSIS — H5213 Myopia, bilateral: Secondary | ICD-10-CM | POA: Diagnosis not present

## 2020-12-15 DIAGNOSIS — I10 Essential (primary) hypertension: Secondary | ICD-10-CM | POA: Diagnosis not present

## 2020-12-15 DIAGNOSIS — Z95811 Presence of heart assist device: Secondary | ICD-10-CM | POA: Diagnosis not present

## 2020-12-15 DIAGNOSIS — I5023 Acute on chronic systolic (congestive) heart failure: Secondary | ICD-10-CM | POA: Diagnosis not present

## 2020-12-15 DIAGNOSIS — Z7901 Long term (current) use of anticoagulants: Secondary | ICD-10-CM | POA: Diagnosis not present

## 2020-12-16 DIAGNOSIS — J45909 Unspecified asthma, uncomplicated: Secondary | ICD-10-CM | POA: Diagnosis not present

## 2020-12-16 DIAGNOSIS — E785 Hyperlipidemia, unspecified: Secondary | ICD-10-CM | POA: Diagnosis not present

## 2020-12-16 DIAGNOSIS — G8929 Other chronic pain: Secondary | ICD-10-CM | POA: Diagnosis not present

## 2020-12-16 DIAGNOSIS — Z48812 Encounter for surgical aftercare following surgery on the circulatory system: Secondary | ICD-10-CM | POA: Diagnosis not present

## 2020-12-16 DIAGNOSIS — Z7901 Long term (current) use of anticoagulants: Secondary | ICD-10-CM | POA: Diagnosis not present

## 2020-12-16 DIAGNOSIS — I340 Nonrheumatic mitral (valve) insufficiency: Secondary | ICD-10-CM | POA: Diagnosis not present

## 2020-12-16 DIAGNOSIS — M109 Gout, unspecified: Secondary | ICD-10-CM | POA: Diagnosis not present

## 2020-12-16 DIAGNOSIS — Z952 Presence of prosthetic heart valve: Secondary | ICD-10-CM | POA: Diagnosis not present

## 2020-12-16 DIAGNOSIS — I5022 Chronic systolic (congestive) heart failure: Secondary | ICD-10-CM | POA: Diagnosis not present

## 2020-12-16 DIAGNOSIS — N182 Chronic kidney disease, stage 2 (mild): Secondary | ICD-10-CM | POA: Diagnosis not present

## 2020-12-16 DIAGNOSIS — I5023 Acute on chronic systolic (congestive) heart failure: Secondary | ICD-10-CM | POA: Diagnosis not present

## 2020-12-16 DIAGNOSIS — I2510 Atherosclerotic heart disease of native coronary artery without angina pectoris: Secondary | ICD-10-CM | POA: Diagnosis not present

## 2020-12-16 DIAGNOSIS — F064 Anxiety disorder due to known physiological condition: Secondary | ICD-10-CM | POA: Diagnosis not present

## 2020-12-16 DIAGNOSIS — Z85850 Personal history of malignant neoplasm of thyroid: Secondary | ICD-10-CM | POA: Diagnosis not present

## 2020-12-16 DIAGNOSIS — I252 Old myocardial infarction: Secondary | ICD-10-CM | POA: Diagnosis not present

## 2020-12-16 DIAGNOSIS — Z9181 History of falling: Secondary | ICD-10-CM | POA: Diagnosis not present

## 2020-12-16 DIAGNOSIS — K219 Gastro-esophageal reflux disease without esophagitis: Secondary | ICD-10-CM | POA: Diagnosis not present

## 2020-12-16 DIAGNOSIS — D631 Anemia in chronic kidney disease: Secondary | ICD-10-CM | POA: Diagnosis not present

## 2020-12-16 DIAGNOSIS — M5431 Sciatica, right side: Secondary | ICD-10-CM | POA: Diagnosis not present

## 2020-12-16 DIAGNOSIS — Z79891 Long term (current) use of opiate analgesic: Secondary | ICD-10-CM | POA: Diagnosis not present

## 2020-12-16 DIAGNOSIS — Z95810 Presence of automatic (implantable) cardiac defibrillator: Secondary | ICD-10-CM | POA: Diagnosis not present

## 2020-12-16 DIAGNOSIS — I255 Ischemic cardiomyopathy: Secondary | ICD-10-CM | POA: Diagnosis not present

## 2020-12-16 DIAGNOSIS — Z951 Presence of aortocoronary bypass graft: Secondary | ICD-10-CM | POA: Diagnosis not present

## 2020-12-16 DIAGNOSIS — I130 Hypertensive heart and chronic kidney disease with heart failure and stage 1 through stage 4 chronic kidney disease, or unspecified chronic kidney disease: Secondary | ICD-10-CM | POA: Diagnosis not present

## 2020-12-16 DIAGNOSIS — Z7982 Long term (current) use of aspirin: Secondary | ICD-10-CM | POA: Diagnosis not present

## 2020-12-16 DIAGNOSIS — Z5181 Encounter for therapeutic drug level monitoring: Secondary | ICD-10-CM | POA: Diagnosis not present

## 2020-12-22 DIAGNOSIS — I340 Nonrheumatic mitral (valve) insufficiency: Secondary | ICD-10-CM | POA: Diagnosis not present

## 2020-12-22 DIAGNOSIS — I2510 Atherosclerotic heart disease of native coronary artery without angina pectoris: Secondary | ICD-10-CM | POA: Diagnosis not present

## 2020-12-22 DIAGNOSIS — Z79891 Long term (current) use of opiate analgesic: Secondary | ICD-10-CM | POA: Diagnosis not present

## 2020-12-22 DIAGNOSIS — J45909 Unspecified asthma, uncomplicated: Secondary | ICD-10-CM | POA: Diagnosis not present

## 2020-12-22 DIAGNOSIS — Z7982 Long term (current) use of aspirin: Secondary | ICD-10-CM | POA: Diagnosis not present

## 2020-12-22 DIAGNOSIS — F064 Anxiety disorder due to known physiological condition: Secondary | ICD-10-CM | POA: Diagnosis not present

## 2020-12-22 DIAGNOSIS — K219 Gastro-esophageal reflux disease without esophagitis: Secondary | ICD-10-CM | POA: Diagnosis not present

## 2020-12-22 DIAGNOSIS — Z9181 History of falling: Secondary | ICD-10-CM | POA: Diagnosis not present

## 2020-12-22 DIAGNOSIS — G8929 Other chronic pain: Secondary | ICD-10-CM | POA: Diagnosis not present

## 2020-12-22 DIAGNOSIS — Z85850 Personal history of malignant neoplasm of thyroid: Secondary | ICD-10-CM | POA: Diagnosis not present

## 2020-12-22 DIAGNOSIS — Z5181 Encounter for therapeutic drug level monitoring: Secondary | ICD-10-CM | POA: Diagnosis not present

## 2020-12-22 DIAGNOSIS — Z7901 Long term (current) use of anticoagulants: Secondary | ICD-10-CM | POA: Diagnosis not present

## 2020-12-22 DIAGNOSIS — M109 Gout, unspecified: Secondary | ICD-10-CM | POA: Diagnosis not present

## 2020-12-22 DIAGNOSIS — D631 Anemia in chronic kidney disease: Secondary | ICD-10-CM | POA: Diagnosis not present

## 2020-12-22 DIAGNOSIS — N182 Chronic kidney disease, stage 2 (mild): Secondary | ICD-10-CM | POA: Diagnosis not present

## 2020-12-22 DIAGNOSIS — I130 Hypertensive heart and chronic kidney disease with heart failure and stage 1 through stage 4 chronic kidney disease, or unspecified chronic kidney disease: Secondary | ICD-10-CM | POA: Diagnosis not present

## 2020-12-22 DIAGNOSIS — I252 Old myocardial infarction: Secondary | ICD-10-CM | POA: Diagnosis not present

## 2020-12-22 DIAGNOSIS — I5022 Chronic systolic (congestive) heart failure: Secondary | ICD-10-CM | POA: Diagnosis not present

## 2020-12-22 DIAGNOSIS — E785 Hyperlipidemia, unspecified: Secondary | ICD-10-CM | POA: Diagnosis not present

## 2020-12-22 DIAGNOSIS — Z48812 Encounter for surgical aftercare following surgery on the circulatory system: Secondary | ICD-10-CM | POA: Diagnosis not present

## 2020-12-22 DIAGNOSIS — Z951 Presence of aortocoronary bypass graft: Secondary | ICD-10-CM | POA: Diagnosis not present

## 2020-12-22 DIAGNOSIS — Z952 Presence of prosthetic heart valve: Secondary | ICD-10-CM | POA: Diagnosis not present

## 2020-12-22 DIAGNOSIS — Z95810 Presence of automatic (implantable) cardiac defibrillator: Secondary | ICD-10-CM | POA: Diagnosis not present

## 2020-12-22 DIAGNOSIS — I255 Ischemic cardiomyopathy: Secondary | ICD-10-CM | POA: Diagnosis not present

## 2020-12-22 DIAGNOSIS — M5431 Sciatica, right side: Secondary | ICD-10-CM | POA: Diagnosis not present

## 2021-01-04 DIAGNOSIS — N182 Chronic kidney disease, stage 2 (mild): Secondary | ICD-10-CM | POA: Diagnosis not present

## 2021-01-04 DIAGNOSIS — I252 Old myocardial infarction: Secondary | ICD-10-CM | POA: Diagnosis not present

## 2021-01-04 DIAGNOSIS — K219 Gastro-esophageal reflux disease without esophagitis: Secondary | ICD-10-CM | POA: Diagnosis not present

## 2021-01-04 DIAGNOSIS — Z79891 Long term (current) use of opiate analgesic: Secondary | ICD-10-CM | POA: Diagnosis not present

## 2021-01-04 DIAGNOSIS — Z7901 Long term (current) use of anticoagulants: Secondary | ICD-10-CM | POA: Diagnosis not present

## 2021-01-04 DIAGNOSIS — Z952 Presence of prosthetic heart valve: Secondary | ICD-10-CM | POA: Diagnosis not present

## 2021-01-04 DIAGNOSIS — Z9181 History of falling: Secondary | ICD-10-CM | POA: Diagnosis not present

## 2021-01-04 DIAGNOSIS — I255 Ischemic cardiomyopathy: Secondary | ICD-10-CM | POA: Diagnosis not present

## 2021-01-04 DIAGNOSIS — I5022 Chronic systolic (congestive) heart failure: Secondary | ICD-10-CM | POA: Diagnosis not present

## 2021-01-04 DIAGNOSIS — G8929 Other chronic pain: Secondary | ICD-10-CM | POA: Diagnosis not present

## 2021-01-04 DIAGNOSIS — M5431 Sciatica, right side: Secondary | ICD-10-CM | POA: Diagnosis not present

## 2021-01-04 DIAGNOSIS — I340 Nonrheumatic mitral (valve) insufficiency: Secondary | ICD-10-CM | POA: Diagnosis not present

## 2021-01-04 DIAGNOSIS — Z951 Presence of aortocoronary bypass graft: Secondary | ICD-10-CM | POA: Diagnosis not present

## 2021-01-04 DIAGNOSIS — Z85850 Personal history of malignant neoplasm of thyroid: Secondary | ICD-10-CM | POA: Diagnosis not present

## 2021-01-04 DIAGNOSIS — F064 Anxiety disorder due to known physiological condition: Secondary | ICD-10-CM | POA: Diagnosis not present

## 2021-01-04 DIAGNOSIS — Z95810 Presence of automatic (implantable) cardiac defibrillator: Secondary | ICD-10-CM | POA: Diagnosis not present

## 2021-01-04 DIAGNOSIS — E785 Hyperlipidemia, unspecified: Secondary | ICD-10-CM | POA: Diagnosis not present

## 2021-01-04 DIAGNOSIS — I2510 Atherosclerotic heart disease of native coronary artery without angina pectoris: Secondary | ICD-10-CM | POA: Diagnosis not present

## 2021-01-04 DIAGNOSIS — Z5181 Encounter for therapeutic drug level monitoring: Secondary | ICD-10-CM | POA: Diagnosis not present

## 2021-01-04 DIAGNOSIS — Z7982 Long term (current) use of aspirin: Secondary | ICD-10-CM | POA: Diagnosis not present

## 2021-01-04 DIAGNOSIS — I130 Hypertensive heart and chronic kidney disease with heart failure and stage 1 through stage 4 chronic kidney disease, or unspecified chronic kidney disease: Secondary | ICD-10-CM | POA: Diagnosis not present

## 2021-01-04 DIAGNOSIS — J45909 Unspecified asthma, uncomplicated: Secondary | ICD-10-CM | POA: Diagnosis not present

## 2021-01-04 DIAGNOSIS — D631 Anemia in chronic kidney disease: Secondary | ICD-10-CM | POA: Diagnosis not present

## 2021-01-04 DIAGNOSIS — M109 Gout, unspecified: Secondary | ICD-10-CM | POA: Diagnosis not present

## 2021-01-07 DIAGNOSIS — Z8679 Personal history of other diseases of the circulatory system: Secondary | ICD-10-CM | POA: Diagnosis not present

## 2021-01-07 DIAGNOSIS — I517 Cardiomegaly: Secondary | ICD-10-CM | POA: Diagnosis not present

## 2021-01-07 DIAGNOSIS — J9811 Atelectasis: Secondary | ICD-10-CM | POA: Diagnosis not present

## 2021-01-07 DIAGNOSIS — J90 Pleural effusion, not elsewhere classified: Secondary | ICD-10-CM | POA: Diagnosis not present

## 2021-01-11 DIAGNOSIS — I712 Thoracic aortic aneurysm, without rupture: Secondary | ICD-10-CM | POA: Diagnosis not present

## 2021-01-11 DIAGNOSIS — I255 Ischemic cardiomyopathy: Secondary | ICD-10-CM | POA: Diagnosis not present

## 2021-01-11 DIAGNOSIS — Z9889 Other specified postprocedural states: Secondary | ICD-10-CM | POA: Diagnosis not present

## 2021-01-11 DIAGNOSIS — I5023 Acute on chronic systolic (congestive) heart failure: Secondary | ICD-10-CM | POA: Diagnosis not present

## 2021-01-17 DIAGNOSIS — M5431 Sciatica, right side: Secondary | ICD-10-CM | POA: Diagnosis not present

## 2021-01-17 DIAGNOSIS — Z7982 Long term (current) use of aspirin: Secondary | ICD-10-CM | POA: Diagnosis not present

## 2021-01-17 DIAGNOSIS — Z95810 Presence of automatic (implantable) cardiac defibrillator: Secondary | ICD-10-CM | POA: Diagnosis not present

## 2021-01-17 DIAGNOSIS — Z79891 Long term (current) use of opiate analgesic: Secondary | ICD-10-CM | POA: Diagnosis not present

## 2021-01-17 DIAGNOSIS — Z951 Presence of aortocoronary bypass graft: Secondary | ICD-10-CM | POA: Diagnosis not present

## 2021-01-17 DIAGNOSIS — E785 Hyperlipidemia, unspecified: Secondary | ICD-10-CM | POA: Diagnosis not present

## 2021-01-17 DIAGNOSIS — I2510 Atherosclerotic heart disease of native coronary artery without angina pectoris: Secondary | ICD-10-CM | POA: Diagnosis not present

## 2021-01-17 DIAGNOSIS — F064 Anxiety disorder due to known physiological condition: Secondary | ICD-10-CM | POA: Diagnosis not present

## 2021-01-17 DIAGNOSIS — Z5181 Encounter for therapeutic drug level monitoring: Secondary | ICD-10-CM | POA: Diagnosis not present

## 2021-01-17 DIAGNOSIS — D631 Anemia in chronic kidney disease: Secondary | ICD-10-CM | POA: Diagnosis not present

## 2021-01-17 DIAGNOSIS — I5022 Chronic systolic (congestive) heart failure: Secondary | ICD-10-CM | POA: Diagnosis not present

## 2021-01-17 DIAGNOSIS — I130 Hypertensive heart and chronic kidney disease with heart failure and stage 1 through stage 4 chronic kidney disease, or unspecified chronic kidney disease: Secondary | ICD-10-CM | POA: Diagnosis not present

## 2021-01-17 DIAGNOSIS — N182 Chronic kidney disease, stage 2 (mild): Secondary | ICD-10-CM | POA: Diagnosis not present

## 2021-01-17 DIAGNOSIS — Z9181 History of falling: Secondary | ICD-10-CM | POA: Diagnosis not present

## 2021-01-17 DIAGNOSIS — Z7901 Long term (current) use of anticoagulants: Secondary | ICD-10-CM | POA: Diagnosis not present

## 2021-01-17 DIAGNOSIS — Z85850 Personal history of malignant neoplasm of thyroid: Secondary | ICD-10-CM | POA: Diagnosis not present

## 2021-01-17 DIAGNOSIS — I255 Ischemic cardiomyopathy: Secondary | ICD-10-CM | POA: Diagnosis not present

## 2021-01-17 DIAGNOSIS — K219 Gastro-esophageal reflux disease without esophagitis: Secondary | ICD-10-CM | POA: Diagnosis not present

## 2021-01-17 DIAGNOSIS — Z952 Presence of prosthetic heart valve: Secondary | ICD-10-CM | POA: Diagnosis not present

## 2021-01-17 DIAGNOSIS — I252 Old myocardial infarction: Secondary | ICD-10-CM | POA: Diagnosis not present

## 2021-01-17 DIAGNOSIS — I340 Nonrheumatic mitral (valve) insufficiency: Secondary | ICD-10-CM | POA: Diagnosis not present

## 2021-01-17 DIAGNOSIS — J45909 Unspecified asthma, uncomplicated: Secondary | ICD-10-CM | POA: Diagnosis not present

## 2021-01-17 DIAGNOSIS — G8929 Other chronic pain: Secondary | ICD-10-CM | POA: Diagnosis not present

## 2021-01-17 DIAGNOSIS — M109 Gout, unspecified: Secondary | ICD-10-CM | POA: Diagnosis not present

## 2021-01-24 DIAGNOSIS — G8929 Other chronic pain: Secondary | ICD-10-CM | POA: Diagnosis not present

## 2021-01-24 DIAGNOSIS — I252 Old myocardial infarction: Secondary | ICD-10-CM | POA: Diagnosis not present

## 2021-01-24 DIAGNOSIS — J45909 Unspecified asthma, uncomplicated: Secondary | ICD-10-CM | POA: Diagnosis not present

## 2021-01-24 DIAGNOSIS — M5431 Sciatica, right side: Secondary | ICD-10-CM | POA: Diagnosis not present

## 2021-01-24 DIAGNOSIS — I130 Hypertensive heart and chronic kidney disease with heart failure and stage 1 through stage 4 chronic kidney disease, or unspecified chronic kidney disease: Secondary | ICD-10-CM | POA: Diagnosis not present

## 2021-01-24 DIAGNOSIS — I5022 Chronic systolic (congestive) heart failure: Secondary | ICD-10-CM | POA: Diagnosis not present

## 2021-01-24 DIAGNOSIS — M109 Gout, unspecified: Secondary | ICD-10-CM | POA: Diagnosis not present

## 2021-01-24 DIAGNOSIS — Z952 Presence of prosthetic heart valve: Secondary | ICD-10-CM | POA: Diagnosis not present

## 2021-01-24 DIAGNOSIS — D631 Anemia in chronic kidney disease: Secondary | ICD-10-CM | POA: Diagnosis not present

## 2021-01-24 DIAGNOSIS — Z95810 Presence of automatic (implantable) cardiac defibrillator: Secondary | ICD-10-CM | POA: Diagnosis not present

## 2021-01-24 DIAGNOSIS — Z5181 Encounter for therapeutic drug level monitoring: Secondary | ICD-10-CM | POA: Diagnosis not present

## 2021-01-24 DIAGNOSIS — F064 Anxiety disorder due to known physiological condition: Secondary | ICD-10-CM | POA: Diagnosis not present

## 2021-01-24 DIAGNOSIS — Z9181 History of falling: Secondary | ICD-10-CM | POA: Diagnosis not present

## 2021-01-24 DIAGNOSIS — Z951 Presence of aortocoronary bypass graft: Secondary | ICD-10-CM | POA: Diagnosis not present

## 2021-01-24 DIAGNOSIS — I2510 Atherosclerotic heart disease of native coronary artery without angina pectoris: Secondary | ICD-10-CM | POA: Diagnosis not present

## 2021-01-24 DIAGNOSIS — Z7982 Long term (current) use of aspirin: Secondary | ICD-10-CM | POA: Diagnosis not present

## 2021-01-24 DIAGNOSIS — Z85850 Personal history of malignant neoplasm of thyroid: Secondary | ICD-10-CM | POA: Diagnosis not present

## 2021-01-24 DIAGNOSIS — Z7901 Long term (current) use of anticoagulants: Secondary | ICD-10-CM | POA: Diagnosis not present

## 2021-01-24 DIAGNOSIS — I340 Nonrheumatic mitral (valve) insufficiency: Secondary | ICD-10-CM | POA: Diagnosis not present

## 2021-01-24 DIAGNOSIS — E785 Hyperlipidemia, unspecified: Secondary | ICD-10-CM | POA: Diagnosis not present

## 2021-01-24 DIAGNOSIS — Z79891 Long term (current) use of opiate analgesic: Secondary | ICD-10-CM | POA: Diagnosis not present

## 2021-01-24 DIAGNOSIS — K219 Gastro-esophageal reflux disease without esophagitis: Secondary | ICD-10-CM | POA: Diagnosis not present

## 2021-01-24 DIAGNOSIS — I255 Ischemic cardiomyopathy: Secondary | ICD-10-CM | POA: Diagnosis not present

## 2021-01-24 DIAGNOSIS — N182 Chronic kidney disease, stage 2 (mild): Secondary | ICD-10-CM | POA: Diagnosis not present

## 2021-02-01 DIAGNOSIS — N182 Chronic kidney disease, stage 2 (mild): Secondary | ICD-10-CM | POA: Diagnosis not present

## 2021-02-01 DIAGNOSIS — G8929 Other chronic pain: Secondary | ICD-10-CM | POA: Diagnosis not present

## 2021-02-01 DIAGNOSIS — D631 Anemia in chronic kidney disease: Secondary | ICD-10-CM | POA: Diagnosis not present

## 2021-02-01 DIAGNOSIS — I252 Old myocardial infarction: Secondary | ICD-10-CM | POA: Diagnosis not present

## 2021-02-01 DIAGNOSIS — E785 Hyperlipidemia, unspecified: Secondary | ICD-10-CM | POA: Diagnosis not present

## 2021-02-01 DIAGNOSIS — M5431 Sciatica, right side: Secondary | ICD-10-CM | POA: Diagnosis not present

## 2021-02-01 DIAGNOSIS — Z9181 History of falling: Secondary | ICD-10-CM | POA: Diagnosis not present

## 2021-02-01 DIAGNOSIS — I5022 Chronic systolic (congestive) heart failure: Secondary | ICD-10-CM | POA: Diagnosis not present

## 2021-02-01 DIAGNOSIS — I130 Hypertensive heart and chronic kidney disease with heart failure and stage 1 through stage 4 chronic kidney disease, or unspecified chronic kidney disease: Secondary | ICD-10-CM | POA: Diagnosis not present

## 2021-02-01 DIAGNOSIS — Z85850 Personal history of malignant neoplasm of thyroid: Secondary | ICD-10-CM | POA: Diagnosis not present

## 2021-02-01 DIAGNOSIS — Z7982 Long term (current) use of aspirin: Secondary | ICD-10-CM | POA: Diagnosis not present

## 2021-02-01 DIAGNOSIS — K219 Gastro-esophageal reflux disease without esophagitis: Secondary | ICD-10-CM | POA: Diagnosis not present

## 2021-02-01 DIAGNOSIS — M109 Gout, unspecified: Secondary | ICD-10-CM | POA: Diagnosis not present

## 2021-02-01 DIAGNOSIS — Z7901 Long term (current) use of anticoagulants: Secondary | ICD-10-CM | POA: Diagnosis not present

## 2021-02-01 DIAGNOSIS — Z95810 Presence of automatic (implantable) cardiac defibrillator: Secondary | ICD-10-CM | POA: Diagnosis not present

## 2021-02-01 DIAGNOSIS — Z952 Presence of prosthetic heart valve: Secondary | ICD-10-CM | POA: Diagnosis not present

## 2021-02-01 DIAGNOSIS — Z79891 Long term (current) use of opiate analgesic: Secondary | ICD-10-CM | POA: Diagnosis not present

## 2021-02-01 DIAGNOSIS — Z951 Presence of aortocoronary bypass graft: Secondary | ICD-10-CM | POA: Diagnosis not present

## 2021-02-01 DIAGNOSIS — F064 Anxiety disorder due to known physiological condition: Secondary | ICD-10-CM | POA: Diagnosis not present

## 2021-02-01 DIAGNOSIS — Z5181 Encounter for therapeutic drug level monitoring: Secondary | ICD-10-CM | POA: Diagnosis not present

## 2021-02-01 DIAGNOSIS — I255 Ischemic cardiomyopathy: Secondary | ICD-10-CM | POA: Diagnosis not present

## 2021-02-01 DIAGNOSIS — J45909 Unspecified asthma, uncomplicated: Secondary | ICD-10-CM | POA: Diagnosis not present

## 2021-02-01 DIAGNOSIS — I340 Nonrheumatic mitral (valve) insufficiency: Secondary | ICD-10-CM | POA: Diagnosis not present

## 2021-02-01 DIAGNOSIS — I2510 Atherosclerotic heart disease of native coronary artery without angina pectoris: Secondary | ICD-10-CM | POA: Diagnosis not present

## 2021-02-03 DIAGNOSIS — L7634 Postprocedural seroma of skin and subcutaneous tissue following other procedure: Secondary | ICD-10-CM | POA: Diagnosis not present

## 2021-02-07 ENCOUNTER — Ambulatory Visit (INDEPENDENT_AMBULATORY_CARE_PROVIDER_SITE_OTHER): Payer: Medicaid Other

## 2021-02-07 DIAGNOSIS — M109 Gout, unspecified: Secondary | ICD-10-CM | POA: Diagnosis not present

## 2021-02-07 DIAGNOSIS — Z85850 Personal history of malignant neoplasm of thyroid: Secondary | ICD-10-CM | POA: Diagnosis not present

## 2021-02-07 DIAGNOSIS — I340 Nonrheumatic mitral (valve) insufficiency: Secondary | ICD-10-CM | POA: Diagnosis not present

## 2021-02-07 DIAGNOSIS — I5022 Chronic systolic (congestive) heart failure: Secondary | ICD-10-CM | POA: Diagnosis not present

## 2021-02-07 DIAGNOSIS — D631 Anemia in chronic kidney disease: Secondary | ICD-10-CM | POA: Diagnosis not present

## 2021-02-07 DIAGNOSIS — Z951 Presence of aortocoronary bypass graft: Secondary | ICD-10-CM | POA: Diagnosis not present

## 2021-02-07 DIAGNOSIS — K219 Gastro-esophageal reflux disease without esophagitis: Secondary | ICD-10-CM | POA: Diagnosis not present

## 2021-02-07 DIAGNOSIS — F064 Anxiety disorder due to known physiological condition: Secondary | ICD-10-CM | POA: Diagnosis not present

## 2021-02-07 DIAGNOSIS — Z952 Presence of prosthetic heart valve: Secondary | ICD-10-CM | POA: Diagnosis not present

## 2021-02-07 DIAGNOSIS — J45909 Unspecified asthma, uncomplicated: Secondary | ICD-10-CM | POA: Diagnosis not present

## 2021-02-07 DIAGNOSIS — Z79891 Long term (current) use of opiate analgesic: Secondary | ICD-10-CM | POA: Diagnosis not present

## 2021-02-07 DIAGNOSIS — I130 Hypertensive heart and chronic kidney disease with heart failure and stage 1 through stage 4 chronic kidney disease, or unspecified chronic kidney disease: Secondary | ICD-10-CM | POA: Diagnosis not present

## 2021-02-07 DIAGNOSIS — I255 Ischemic cardiomyopathy: Secondary | ICD-10-CM | POA: Diagnosis not present

## 2021-02-07 DIAGNOSIS — I252 Old myocardial infarction: Secondary | ICD-10-CM | POA: Diagnosis not present

## 2021-02-07 DIAGNOSIS — Z9181 History of falling: Secondary | ICD-10-CM | POA: Diagnosis not present

## 2021-02-07 DIAGNOSIS — N182 Chronic kidney disease, stage 2 (mild): Secondary | ICD-10-CM | POA: Diagnosis not present

## 2021-02-07 DIAGNOSIS — Z5181 Encounter for therapeutic drug level monitoring: Secondary | ICD-10-CM | POA: Diagnosis not present

## 2021-02-07 DIAGNOSIS — Z95810 Presence of automatic (implantable) cardiac defibrillator: Secondary | ICD-10-CM | POA: Diagnosis not present

## 2021-02-07 DIAGNOSIS — E785 Hyperlipidemia, unspecified: Secondary | ICD-10-CM | POA: Diagnosis not present

## 2021-02-07 DIAGNOSIS — Z7982 Long term (current) use of aspirin: Secondary | ICD-10-CM | POA: Diagnosis not present

## 2021-02-07 DIAGNOSIS — G8929 Other chronic pain: Secondary | ICD-10-CM | POA: Diagnosis not present

## 2021-02-07 DIAGNOSIS — I2510 Atherosclerotic heart disease of native coronary artery without angina pectoris: Secondary | ICD-10-CM | POA: Diagnosis not present

## 2021-02-07 DIAGNOSIS — M5431 Sciatica, right side: Secondary | ICD-10-CM | POA: Diagnosis not present

## 2021-02-07 DIAGNOSIS — Z7901 Long term (current) use of anticoagulants: Secondary | ICD-10-CM | POA: Diagnosis not present

## 2021-02-07 LAB — CUP PACEART REMOTE DEVICE CHECK
Battery Remaining Longevity: 60 mo
Battery Remaining Percentage: 61 %
Brady Statistic RV Percent Paced: 0 %
Date Time Interrogation Session: 20220214032100
HighPow Impedance: 51 Ohm
Implantable Lead Implant Date: 20171114
Implantable Lead Location: 753860
Implantable Lead Model: 181
Implantable Lead Serial Number: 333496
Implantable Pulse Generator Implant Date: 20131220
Lead Channel Impedance Value: 413 Ohm
Lead Channel Pacing Threshold Amplitude: 0.6 V
Lead Channel Pacing Threshold Pulse Width: 0.4 ms
Lead Channel Setting Pacing Amplitude: 2 V
Lead Channel Setting Pacing Pulse Width: 0.4 ms
Lead Channel Setting Sensing Sensitivity: 0.5 mV
Pulse Gen Serial Number: 124654

## 2021-02-11 NOTE — Progress Notes (Signed)
Remote ICD transmission.   

## 2021-02-13 ENCOUNTER — Other Ambulatory Visit (HOSPITAL_COMMUNITY): Payer: Self-pay | Admitting: Cardiology

## 2021-02-15 DIAGNOSIS — Z951 Presence of aortocoronary bypass graft: Secondary | ICD-10-CM | POA: Diagnosis not present

## 2021-02-15 DIAGNOSIS — Z95811 Presence of heart assist device: Secondary | ICD-10-CM | POA: Diagnosis not present

## 2021-02-15 DIAGNOSIS — I2510 Atherosclerotic heart disease of native coronary artery without angina pectoris: Secondary | ICD-10-CM | POA: Diagnosis not present

## 2021-02-15 DIAGNOSIS — E785 Hyperlipidemia, unspecified: Secondary | ICD-10-CM | POA: Diagnosis not present

## 2021-02-15 DIAGNOSIS — Z9181 History of falling: Secondary | ICD-10-CM | POA: Diagnosis not present

## 2021-02-15 DIAGNOSIS — N182 Chronic kidney disease, stage 2 (mild): Secondary | ICD-10-CM | POA: Diagnosis not present

## 2021-02-15 DIAGNOSIS — G8929 Other chronic pain: Secondary | ICD-10-CM | POA: Diagnosis not present

## 2021-02-15 DIAGNOSIS — I5022 Chronic systolic (congestive) heart failure: Secondary | ICD-10-CM | POA: Diagnosis not present

## 2021-02-15 DIAGNOSIS — Z5181 Encounter for therapeutic drug level monitoring: Secondary | ICD-10-CM | POA: Diagnosis not present

## 2021-02-15 DIAGNOSIS — I5023 Acute on chronic systolic (congestive) heart failure: Secondary | ICD-10-CM | POA: Diagnosis not present

## 2021-02-15 DIAGNOSIS — M109 Gout, unspecified: Secondary | ICD-10-CM | POA: Diagnosis not present

## 2021-02-15 DIAGNOSIS — D631 Anemia in chronic kidney disease: Secondary | ICD-10-CM | POA: Diagnosis not present

## 2021-02-15 DIAGNOSIS — F064 Anxiety disorder due to known physiological condition: Secondary | ICD-10-CM | POA: Diagnosis not present

## 2021-02-15 DIAGNOSIS — I252 Old myocardial infarction: Secondary | ICD-10-CM | POA: Diagnosis not present

## 2021-02-15 DIAGNOSIS — K219 Gastro-esophageal reflux disease without esophagitis: Secondary | ICD-10-CM | POA: Diagnosis not present

## 2021-02-15 DIAGNOSIS — Z85850 Personal history of malignant neoplasm of thyroid: Secondary | ICD-10-CM | POA: Diagnosis not present

## 2021-02-15 DIAGNOSIS — I130 Hypertensive heart and chronic kidney disease with heart failure and stage 1 through stage 4 chronic kidney disease, or unspecified chronic kidney disease: Secondary | ICD-10-CM | POA: Diagnosis not present

## 2021-02-15 DIAGNOSIS — Z7982 Long term (current) use of aspirin: Secondary | ICD-10-CM | POA: Diagnosis not present

## 2021-02-15 DIAGNOSIS — Z7901 Long term (current) use of anticoagulants: Secondary | ICD-10-CM | POA: Diagnosis not present

## 2021-02-15 DIAGNOSIS — D509 Iron deficiency anemia, unspecified: Secondary | ICD-10-CM | POA: Diagnosis not present

## 2021-02-15 DIAGNOSIS — I255 Ischemic cardiomyopathy: Secondary | ICD-10-CM | POA: Diagnosis not present

## 2021-02-15 DIAGNOSIS — Z95810 Presence of automatic (implantable) cardiac defibrillator: Secondary | ICD-10-CM | POA: Diagnosis not present

## 2021-02-15 DIAGNOSIS — M5431 Sciatica, right side: Secondary | ICD-10-CM | POA: Diagnosis not present

## 2021-02-15 DIAGNOSIS — J45909 Unspecified asthma, uncomplicated: Secondary | ICD-10-CM | POA: Diagnosis not present

## 2021-02-15 DIAGNOSIS — Z79891 Long term (current) use of opiate analgesic: Secondary | ICD-10-CM | POA: Diagnosis not present

## 2021-02-15 DIAGNOSIS — I340 Nonrheumatic mitral (valve) insufficiency: Secondary | ICD-10-CM | POA: Diagnosis not present

## 2021-02-15 DIAGNOSIS — Z952 Presence of prosthetic heart valve: Secondary | ICD-10-CM | POA: Diagnosis not present

## 2021-02-21 DIAGNOSIS — Z1211 Encounter for screening for malignant neoplasm of colon: Secondary | ICD-10-CM | POA: Diagnosis not present

## 2021-02-21 DIAGNOSIS — Z125 Encounter for screening for malignant neoplasm of prostate: Secondary | ICD-10-CM | POA: Diagnosis not present

## 2021-02-21 DIAGNOSIS — I2510 Atherosclerotic heart disease of native coronary artery without angina pectoris: Secondary | ICD-10-CM | POA: Diagnosis not present

## 2021-02-21 DIAGNOSIS — J9811 Atelectasis: Secondary | ICD-10-CM | POA: Diagnosis not present

## 2021-02-21 DIAGNOSIS — J90 Pleural effusion, not elsewhere classified: Secondary | ICD-10-CM | POA: Diagnosis not present

## 2021-02-21 DIAGNOSIS — Z01810 Encounter for preprocedural cardiovascular examination: Secondary | ICD-10-CM | POA: Diagnosis not present

## 2021-02-21 DIAGNOSIS — I255 Ischemic cardiomyopathy: Secondary | ICD-10-CM | POA: Diagnosis not present

## 2021-02-21 DIAGNOSIS — R918 Other nonspecific abnormal finding of lung field: Secondary | ICD-10-CM | POA: Diagnosis not present

## 2021-02-21 DIAGNOSIS — Z01818 Encounter for other preprocedural examination: Secondary | ICD-10-CM | POA: Diagnosis not present

## 2021-02-22 DIAGNOSIS — Z125 Encounter for screening for malignant neoplasm of prostate: Secondary | ICD-10-CM | POA: Diagnosis not present

## 2021-02-22 DIAGNOSIS — Z01810 Encounter for preprocedural cardiovascular examination: Secondary | ICD-10-CM | POA: Diagnosis not present

## 2021-02-22 DIAGNOSIS — Z01818 Encounter for other preprocedural examination: Secondary | ICD-10-CM | POA: Diagnosis not present

## 2021-02-22 DIAGNOSIS — Z1211 Encounter for screening for malignant neoplasm of colon: Secondary | ICD-10-CM | POA: Diagnosis not present

## 2021-02-23 DIAGNOSIS — J90 Pleural effusion, not elsewhere classified: Secondary | ICD-10-CM | POA: Diagnosis not present

## 2021-02-23 DIAGNOSIS — Z01818 Encounter for other preprocedural examination: Secondary | ICD-10-CM | POA: Diagnosis not present

## 2021-02-23 DIAGNOSIS — Z125 Encounter for screening for malignant neoplasm of prostate: Secondary | ICD-10-CM | POA: Diagnosis not present

## 2021-02-23 DIAGNOSIS — Z01810 Encounter for preprocedural cardiovascular examination: Secondary | ICD-10-CM | POA: Diagnosis not present

## 2021-02-23 DIAGNOSIS — D481 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: Secondary | ICD-10-CM | POA: Diagnosis not present

## 2021-02-23 DIAGNOSIS — I7100 Dissection of unspecified site of aorta: Secondary | ICD-10-CM | POA: Diagnosis not present

## 2021-02-23 DIAGNOSIS — Z1211 Encounter for screening for malignant neoplasm of colon: Secondary | ICD-10-CM | POA: Diagnosis not present

## 2021-02-23 DIAGNOSIS — Z95811 Presence of heart assist device: Secondary | ICD-10-CM | POA: Diagnosis not present

## 2021-02-25 DIAGNOSIS — I255 Ischemic cardiomyopathy: Secondary | ICD-10-CM | POA: Diagnosis not present

## 2021-02-25 DIAGNOSIS — Z008 Encounter for other general examination: Secondary | ICD-10-CM | POA: Diagnosis not present

## 2021-02-25 DIAGNOSIS — Z7682 Awaiting organ transplant status: Secondary | ICD-10-CM | POA: Diagnosis not present

## 2021-02-25 DIAGNOSIS — F54 Psychological and behavioral factors associated with disorders or diseases classified elsewhere: Secondary | ICD-10-CM | POA: Diagnosis not present

## 2021-02-28 DIAGNOSIS — R1904 Left lower quadrant abdominal swelling, mass and lump: Secondary | ICD-10-CM | POA: Diagnosis not present

## 2021-03-02 DIAGNOSIS — I252 Old myocardial infarction: Secondary | ICD-10-CM | POA: Diagnosis not present

## 2021-03-02 DIAGNOSIS — Z79891 Long term (current) use of opiate analgesic: Secondary | ICD-10-CM | POA: Diagnosis not present

## 2021-03-02 DIAGNOSIS — Z5181 Encounter for therapeutic drug level monitoring: Secondary | ICD-10-CM | POA: Diagnosis not present

## 2021-03-02 DIAGNOSIS — M5431 Sciatica, right side: Secondary | ICD-10-CM | POA: Diagnosis not present

## 2021-03-02 DIAGNOSIS — I340 Nonrheumatic mitral (valve) insufficiency: Secondary | ICD-10-CM | POA: Diagnosis not present

## 2021-03-02 DIAGNOSIS — I255 Ischemic cardiomyopathy: Secondary | ICD-10-CM | POA: Diagnosis not present

## 2021-03-02 DIAGNOSIS — I130 Hypertensive heart and chronic kidney disease with heart failure and stage 1 through stage 4 chronic kidney disease, or unspecified chronic kidney disease: Secondary | ICD-10-CM | POA: Diagnosis not present

## 2021-03-02 DIAGNOSIS — N182 Chronic kidney disease, stage 2 (mild): Secondary | ICD-10-CM | POA: Diagnosis not present

## 2021-03-02 DIAGNOSIS — F064 Anxiety disorder due to known physiological condition: Secondary | ICD-10-CM | POA: Diagnosis not present

## 2021-03-02 DIAGNOSIS — Z85850 Personal history of malignant neoplasm of thyroid: Secondary | ICD-10-CM | POA: Diagnosis not present

## 2021-03-02 DIAGNOSIS — M109 Gout, unspecified: Secondary | ICD-10-CM | POA: Diagnosis not present

## 2021-03-02 DIAGNOSIS — G8929 Other chronic pain: Secondary | ICD-10-CM | POA: Diagnosis not present

## 2021-03-02 DIAGNOSIS — K219 Gastro-esophageal reflux disease without esophagitis: Secondary | ICD-10-CM | POA: Diagnosis not present

## 2021-03-02 DIAGNOSIS — Z951 Presence of aortocoronary bypass graft: Secondary | ICD-10-CM | POA: Diagnosis not present

## 2021-03-02 DIAGNOSIS — Z7901 Long term (current) use of anticoagulants: Secondary | ICD-10-CM | POA: Diagnosis not present

## 2021-03-02 DIAGNOSIS — I5022 Chronic systolic (congestive) heart failure: Secondary | ICD-10-CM | POA: Diagnosis not present

## 2021-03-02 DIAGNOSIS — Z7982 Long term (current) use of aspirin: Secondary | ICD-10-CM | POA: Diagnosis not present

## 2021-03-02 DIAGNOSIS — E785 Hyperlipidemia, unspecified: Secondary | ICD-10-CM | POA: Diagnosis not present

## 2021-03-02 DIAGNOSIS — Z9181 History of falling: Secondary | ICD-10-CM | POA: Diagnosis not present

## 2021-03-02 DIAGNOSIS — D631 Anemia in chronic kidney disease: Secondary | ICD-10-CM | POA: Diagnosis not present

## 2021-03-02 DIAGNOSIS — I2510 Atherosclerotic heart disease of native coronary artery without angina pectoris: Secondary | ICD-10-CM | POA: Diagnosis not present

## 2021-03-02 DIAGNOSIS — Z952 Presence of prosthetic heart valve: Secondary | ICD-10-CM | POA: Diagnosis not present

## 2021-03-02 DIAGNOSIS — J45909 Unspecified asthma, uncomplicated: Secondary | ICD-10-CM | POA: Diagnosis not present

## 2021-03-02 DIAGNOSIS — Z95810 Presence of automatic (implantable) cardiac defibrillator: Secondary | ICD-10-CM | POA: Diagnosis not present

## 2021-03-06 ENCOUNTER — Other Ambulatory Visit: Payer: Self-pay | Admitting: Family Medicine

## 2021-03-06 NOTE — Progress Notes (Unsigned)
Electrophysiology Office Note Date: 03/07/2021  ID:  Chad Bark., DOB [birth year], MRN [id number]  PCP: Lurline Del, DO Primary Cardiologist: Glori Bickers, MD Electrophysiologist: Cristopher Peru, MD   CC: Routine ICD follow-up  Chad Laurice Record. is a 60 y.o. male seen today for Cristopher Peru, MD for routine electrophysiology followup.  Since last being seen in our clinic the patient reports doing OK. He overall has felt better since VAD placement, but has felt more fatigue and SOB over the past "little while". He has follow up with [facility] next week, and is being considered for transplant. He has SOB with moderate exertion chronically. His CHF and LVAD are now followed at [facility]. Denies chest pain.  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 AICD (automatic cardioverter/defibrillator) present   . Anemia   . Anginal pain ([place])   . Anxiety   . Aortic dissection, thoracoabdominal ([place])    7/10: Type I s/p repair  . Arthritis   . Asthma   . CAD (coronary artery disease)    a. s/p CABG 2006;  b. DES to PDA 2011 (cath: Dx not seen, dRCA/PDA tx with DES; S-PDA occluded (culprit), S-Dx occluded, S-RI and OM ok, L-LAD ok  . Carotid stenosis    dopplers 2011: 0-39% bilat.  . Chest pain syndrome   . CHF (congestive heart failure) ([place])   . Chronic bronchitis ([place])   . Chronic lower back pain   . Chronic systolic heart failure (HCC)    a. 12/13 ECHO: EF 35-40%, sept, apical & posterobasal HK, LV mod dil & sys fx mod reduced, mild AI, MV mild reg, TV mild reg  . Complication of anesthesia    "difficult to wake afterwards a couple of times" and patient states he has woken during surgery   . 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 Dyspnea    patient denies at 12/06/2018 appt   . Family history of adverse reaction to anesthesia     "sister hard to wake up"  . GERD (gastroesophageal reflux disease)   . Gout   . Heart murmur   . HLD (hyperlipidemia)   . HTN (hypertension)    severe  . Myocardial infarction [facility])    "many" (11/07/2016)  . PONV (postoperative nausea and vomiting)   . Sleep apnea    does not use mask   . Thyroid cancer ([place])    [employer]   Past Surgical History:  Procedure Laterality Date  . BACK SURGERY    . CARDIAC CATHETERIZATION     "several" (11/07/2016)  . CARDIAC DEFIBRILLATOR PLACEMENT  11/2005   [employer]; Archie Endo 05/09/2011  . CHOLECYSTECTOMY N/A 12/09/2018   Procedure: LAPAROSCOPIC CHOLECYSTECTOMY;  Surgeon: Ralene Ok, MD;  Location: [facility];  Service: General;  Laterality: N/A;  . CORONARY ANGIOPLASTY WITH STENT PLACEMENT     "I've had 1-2 stents" (11/07/2016)  . CORONARY ARTERY BYPASS GRAFT  06/21/2009   "CABG X2"  . CORONARY ARTERY BYPASS GRAFT  06/29/2005   "CABG X7"  . ICD LEAD REMOVAL  11/07/2016  . ICD LEAD REMOVAL N/A 11/07/2016   Procedure: ICD LEAD REMOVAL, INSERTION OF NEW ICD LEAD;  Surgeon: Evans Lance, MD;  Location: [place];  Service: Cardiovascular;  Laterality: N/A;  Dr. Prescott Gum to backup case  . IMPLANTABLE CARDIOVERTER DEFIBRILLATOR (ICD) GENERATOR  CHANGE N/A 12/13/2012   Procedure: ICD GENERATOR CHANGE;  Surgeon: Evans Lance, MD;  Location[facility] C[facility] LAB;  Service: Cardiovascular;  Laterality: N/A;  . INGUINAL HERNIA REPAIR Bilateral 06/25/2018   Procedure: LAPAROSCOPIC  BILATERAL INGUINAL HERNIA REPAIRS;  Surgeon: Ralene Ok, MD;  Location: [place];  Service: General;  Laterality: Bilateral;  . INSERTION OF MESH Bilateral 06/25/2018   Procedure: INSERTION OF MESH;  Surgeon: Ralene Ok, MD;  Location: [place];  Service: General;  Laterality: Bilateral;  . [place] SURGERY  01/2001    most recent within 5-10 years  . RIGHT HEART CATH N/A 04/05/2018   Procedure: RIGHT HEART CATH;  Surgeon: Jolaine Artist, MD;  Location: [place] CV LAB;  Service: Cardiovascular;  Laterality: N/A;  . RIGHT HEART CATH N/A 01/19/2020   Procedure: RIGHT HEART CATH;  Surgeon: Jolaine Artist, MD;  Location: [place] CV LAB;  Service: Cardiovascular;  Laterality: N/A;  . SHOULDER ARTHROSCOPY WITH ROTATOR CUFF REPAIR Bilateral   . Status post emergency repair of a type A ascending aortic dissection with a hemiarch reconstruction of the ascending aorta  using a 28-mm Hemashield graft with redo sternotomy and revision of previous bypass grafts in June 2010.    . TESTICLE SURGERY    . THYROIDECTOMY, PARTIAL  06/20/2011  . TONSILLECTOMY    . UMBILICAL HERNIA REPAIR N/A 06/25/2018   Procedure: UMBILICAL HERNIA REPAIR;  Surgeon: Ralene Ok, MD;  Location: [place];  Service: General;  Laterality: N/A;  . VASECTOMY      Current Outpatient Medications  Medication Sig Dispense Refill  . acetaminophen (TYLENOL) 325 MG tablet Take 2 tablets (650 mg total) by mouth every 4 (four) hours as needed for headache or mild pain.    . Alirocumab (PRALUENT) 75 MG/ML SOAJ Inject 75 mg into the skin every 14 (fourteen) days. 2 mL 11  . alum & mag hydroxide-simeth (MAALOX/MYLANTA) 200-200-20 MG/5ML suspension Take 15 mLs by mouth every 6 (six) hours as needed for indigestion or heartburn. 355 mL 0  . aspirin EC 81 MG tablet Take 81 mg by mouth every evening.    . carvedilol (COREG) 12.5 MG tablet Take 12.5 mg by mouth 2 (two) times daily.    [name] FARXIGA 5 MG TABS tablet Take 5 mg by mouth every morning.    . fluticasone (FLONASE) 50 MCG/ACT nasal spray Place 2 sprays into both nostrils daily as needed for allergies or rhinitis.     [name] gabapentin (NEURONTIN) 300 MG capsule Take 1 capsule (300 mg total) by mouth at bedtime. 30 capsule 3  . hydrALAZINE (APRESOLINE) 25 MG tablet Take 50 mg by mouth 3 (three) times daily.    . isosorbide mononitrate (IMDUR) 30 MG 24 hr tablet Take 30 mg by mouth daily.    . methocarbamol (ROBAXIN) 750 MG tablet SMARTSIG:2  Tablet(s) By Mouth Every 12 Hours    . MITIGARE 0.6 MG CAPS Take 2 capsules by mouth 2 (two) times daily. 120 capsule 0  . nitroGLYCERIN (NITROSTAT) 0.4 MG SL tablet Place 0.4 mg under the tongue every 5 (five) minutes as needed for chest pain.    . Oxycodone HCl 20 MG TABS Take 20 mg by mouth every 6 (six) hours as needed (For pain).     . potassium chloride (KLOR-CON) 20 MEQ packet Take 20 mEq by mouth as directed.    . sacubitril-valsartan (ENTRESTO) 97-103 MG Take 1 tablet by mouth every 12 (twelve) hours.    [name]  spironolactone (ALDACTONE) 25 MG tablet Take 1 tablet by mouth daily.    [name] torsemide (DEMADEX) 20 MG tablet Take 20 mg by mouth daily.    [name] warfarin (COUMADIN) 2 MG tablet Take by mouth.     No current facility-administered medications for this visit.    Allergies:   Contrast media [iodinated diagnostic agents], Iohexol, Lipitor [atorvastatin calcium], Shellfish allergy, Sulfa antibiotics, Sulfonamide derivatives, Almond oil, Metrizamide, Zocor [simvastatin], Food, Latex, and Peach [prunus persica]   Social History: Social History   Socioeconomic History  . Marital status: Divorced    Spouse name: Not on file  . Number of children: Not on file  . Years of education: Not on file  . Highest education level: Not on file  Occupational History  . Occupation: disabled  Tobacco Use  . Smoking status: Never Smoker  . Smokeless tobacco: Never Used  Vaping Use  . Vaping Use: Never used  Substance and Sexual Activity  . Alcohol use: No  . Drug use: No  . Sexual activity: Yes  Other Topics Concern  . Not on file  Social History Narrative   Engaged and lives with fiancee and 4 kids.    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    Social Determinants of Health   Financial Resource Strain: Not on file  Food Insecurity: Not on file  Transportation Needs: Not on file  Physical Activity: Not on file  Stress: Not on file  Social Connections: Not on file  Intimate Partner  Violence: Not on file    Family History: Family History  Problem Relation Age of Onset  . Hypertension Father   . Heart disease Father   . Early death Father   . COPD Father   . Hypertension Mother   . Coronary artery disease Other     Review of Systems: All other systems reviewed and are otherwise negative except as noted above.   Physical Exam: Vitals:   03/07/21 0907  BP: 138/70  Pulse: 82  SpO2: 96%  Weight: 177 lb 3.2 oz (80.4 kg)  Height: 5\' 11"  (1.803 m)     GEN- The patient is well appearing, alert and oriented x 3 today.   HEENT: normocephalic, atraumatic; sclera clear, conjunctiva pink; hearing intact; oropharynx clear; neck supple, no JVP Lymph- no cervical lymphadenopathy Lungs- Clear to ausculation bilaterally, normal work of breathing.  No wheezes, rales, rhonchi Heart- Regular rate and rhythm, no murmurs, rubs or gallops, PMI not laterally displaced GI- soft, non-tender, non-distended, bowel sounds present, no hepatosplenomegaly Extremities- no clubbing or cyanosis. No edema; DP/PT/radial pulses 2+ bilaterally MS- no significant deformity or atrophy Skin- warm and dry, no rash or lesion; ICD pocket well healed Psych- euthymic mood, full affect Neuro- strength and sensation are intact  ICD interrogation- reviewed in detail today,  See PACEART report  EKG:  EKG is not ordered today.  Recent Labs: 04/28/2020: TSH 3.020 04/30/2020: ALT 33; B Natriuretic Peptide 3,460.9 05/03/2020: BUN 38; Creatinine, Ser 1.71; Hemoglobin 12.2; Magnesium 2.5; Platelets 88; Potassium 4.8; Sodium 138   Wt Readings from Last 3 Encounters:  03/07/21 177 lb 3.2 oz (80.4 kg)  11/02/20 173 lb 6.4 oz (78.7 kg)  08/13/20 150 lb (68 kg)     Other studies Reviewed: Additional studies/ records that were reviewed today include: Previous EP and HF notes   Assessment and Plan:  1.  Chronic systolic dysfunction s/p [employer] single chamber ICD  euvolemic today Stable on an  appropriate medical regimen  Normal ICD function See Chad [name] report No changes today He has had a VAD since last seen in our office. Followed by Dr. Raiford Simmonds. Recent labs from [facility] reviewed and no indication for repeat today.   2. NSVT Stable on device check  3. CAD s/p CABG 2006 Denies s/s ischemia  Current medicines are reviewed at length with the patient today.   The patient does not have concerns regarding his medicines.  The following changes were made today:  none  Labs/ tests ordered today include:  Orders Placed This Encounter  Procedures  . CUP PACEART INCLINIC DEVICE CHECK    Disposition:   Follow up with Dr. Lovena Le annually, unless he decides to consolidate his CHF/LVAD AND ICD care at [facility].   Jacalyn Lefevre, PA-C  03/07/2021 10:44 AM  [facility] [address] [place] [address] [phone] (office) [phone] (fax)

## 2021-03-07 ENCOUNTER — Other Ambulatory Visit: Payer: Self-pay

## 2021-03-07 ENCOUNTER — Ambulatory Visit (INDEPENDENT_AMBULATORY_CARE_PROVIDER_SITE_OTHER): Payer: Medicaid Other | Admitting: Student

## 2021-03-07 ENCOUNTER — Encounter: Payer: Self-pay | Admitting: Student

## 2021-03-07 VITALS — BP 138/70 | HR 82 | Ht 71.0 in | Wt 177.2 lb

## 2021-03-07 DIAGNOSIS — Z79891 Long term (current) use of opiate analgesic: Secondary | ICD-10-CM | POA: Diagnosis not present

## 2021-03-07 DIAGNOSIS — Z952 Presence of prosthetic heart valve: Secondary | ICD-10-CM | POA: Diagnosis not present

## 2021-03-07 DIAGNOSIS — I10 Essential (primary) hypertension: Secondary | ICD-10-CM | POA: Diagnosis not present

## 2021-03-07 DIAGNOSIS — E785 Hyperlipidemia, unspecified: Secondary | ICD-10-CM | POA: Diagnosis not present

## 2021-03-07 DIAGNOSIS — I5022 Chronic systolic (congestive) heart failure: Secondary | ICD-10-CM

## 2021-03-07 DIAGNOSIS — I255 Ischemic cardiomyopathy: Secondary | ICD-10-CM | POA: Diagnosis not present

## 2021-03-07 DIAGNOSIS — Z95810 Presence of automatic (implantable) cardiac defibrillator: Secondary | ICD-10-CM | POA: Diagnosis not present

## 2021-03-07 DIAGNOSIS — I2510 Atherosclerotic heart disease of native coronary artery without angina pectoris: Secondary | ICD-10-CM | POA: Diagnosis not present

## 2021-03-07 DIAGNOSIS — M109 Gout, unspecified: Secondary | ICD-10-CM | POA: Diagnosis not present

## 2021-03-07 DIAGNOSIS — G8929 Other chronic pain: Secondary | ICD-10-CM | POA: Diagnosis not present

## 2021-03-07 DIAGNOSIS — Z951 Presence of aortocoronary bypass graft: Secondary | ICD-10-CM | POA: Diagnosis not present

## 2021-03-07 DIAGNOSIS — I130 Hypertensive heart and chronic kidney disease with heart failure and stage 1 through stage 4 chronic kidney disease, or unspecified chronic kidney disease: Secondary | ICD-10-CM | POA: Diagnosis not present

## 2021-03-07 DIAGNOSIS — Z85850 Personal history of malignant neoplasm of thyroid: Secondary | ICD-10-CM | POA: Diagnosis not present

## 2021-03-07 DIAGNOSIS — I252 Old myocardial infarction: Secondary | ICD-10-CM | POA: Diagnosis not present

## 2021-03-07 DIAGNOSIS — N182 Chronic kidney disease, stage 2 (mild): Secondary | ICD-10-CM | POA: Diagnosis not present

## 2021-03-07 DIAGNOSIS — D631 Anemia in chronic kidney disease: Secondary | ICD-10-CM | POA: Diagnosis not present

## 2021-03-07 DIAGNOSIS — J45909 Unspecified asthma, uncomplicated: Secondary | ICD-10-CM | POA: Diagnosis not present

## 2021-03-07 DIAGNOSIS — I340 Nonrheumatic mitral (valve) insufficiency: Secondary | ICD-10-CM | POA: Diagnosis not present

## 2021-03-07 DIAGNOSIS — K219 Gastro-esophageal reflux disease without esophagitis: Secondary | ICD-10-CM | POA: Diagnosis not present

## 2021-03-07 DIAGNOSIS — Z9181 History of falling: Secondary | ICD-10-CM | POA: Diagnosis not present

## 2021-03-07 DIAGNOSIS — Z7901 Long term (current) use of anticoagulants: Secondary | ICD-10-CM | POA: Diagnosis not present

## 2021-03-07 DIAGNOSIS — F064 Anxiety disorder due to known physiological condition: Secondary | ICD-10-CM | POA: Diagnosis not present

## 2021-03-07 DIAGNOSIS — Z5181 Encounter for therapeutic drug level monitoring: Secondary | ICD-10-CM | POA: Diagnosis not present

## 2021-03-07 DIAGNOSIS — Z7982 Long term (current) use of aspirin: Secondary | ICD-10-CM | POA: Diagnosis not present

## 2021-03-07 DIAGNOSIS — M5431 Sciatica, right side: Secondary | ICD-10-CM | POA: Diagnosis not present

## 2021-03-07 LAB — CUP PACEART INCLINIC DEVICE CHECK
Date Time Interrogation Session: 20220314102916
HighPow Impedance: 34 Ohm
HighPow Impedance: 46 Ohm
Implantable Lead Implant Date: 20171114
Implantable Lead Location: 753860
Implantable Lead Model: 181
Implantable Lead Serial Number: 333496
Implantable Pulse Generator Implant Date: 20131220
Lead Channel Impedance Value: 423 Ohm
Lead Channel Pacing Threshold Amplitude: 0.7 V
Lead Channel Pacing Threshold Pulse Width: 0.4 ms
Lead Channel Sensing Intrinsic Amplitude: 10.2 mV
Lead Channel Setting Pacing Amplitude: 2 V
Lead Channel Setting Pacing Pulse Width: 0.4 ms
Lead Channel Setting Sensing Sensitivity: 0.5 mV
Pulse Gen Serial Number: 124654

## 2021-03-07 NOTE — Patient Instructions (Signed)
Medication Instructions:  Your physician recommends that you continue on your current medications as directed. Please refer to the Current Medication list given to you today.  *If you need a refill on your cardiac medications before your next appointment, please call your pharmacy*   Lab Work: None If you have labs (blood work) drawn today and your tests are completely normal, you will receive your results only by: . MyChart Message (if you have MyChart) OR . A paper copy in the mail If you have any lab test that is abnormal or we need to change your treatment, we will call you to review the results.   Follow-Up: At [facility], you and your health needs are our priority.  As part of our continuing mission to provide you with exceptional heart care, we have created designated Provider Care Teams.  These Care Teams include your primary Cardiologist (physician) and Advanced Practice Providers (APPs -  Physician Assistants and Nurse Practitioners) who all work together to provide you with the care you need, when you need it.  Your next appointment:   1 year(s)  The format for your next appointment:   In Person  Provider:   You may see Gregg Taylor, MD or one of the following Advanced Practice Providers on your designated Care Team:    Amber Seiler, NP  Renee Ursuy, PA-C  Michael "Andy" Tillery, PA-C   

## 2021-03-15 DIAGNOSIS — Z7901 Long term (current) use of anticoagulants: Secondary | ICD-10-CM | POA: Diagnosis not present

## 2021-03-15 DIAGNOSIS — I5023 Acute on chronic systolic (congestive) heart failure: Secondary | ICD-10-CM | POA: Diagnosis not present

## 2021-03-15 DIAGNOSIS — Z7982 Long term (current) use of aspirin: Secondary | ICD-10-CM | POA: Diagnosis not present

## 2021-03-15 DIAGNOSIS — Z85850 Personal history of malignant neoplasm of thyroid: Secondary | ICD-10-CM | POA: Diagnosis not present

## 2021-03-15 DIAGNOSIS — I340 Nonrheumatic mitral (valve) insufficiency: Secondary | ICD-10-CM | POA: Diagnosis not present

## 2021-03-15 DIAGNOSIS — Z79891 Long term (current) use of opiate analgesic: Secondary | ICD-10-CM | POA: Diagnosis not present

## 2021-03-15 DIAGNOSIS — G8929 Other chronic pain: Secondary | ICD-10-CM | POA: Diagnosis not present

## 2021-03-15 DIAGNOSIS — D631 Anemia in chronic kidney disease: Secondary | ICD-10-CM | POA: Diagnosis not present

## 2021-03-15 DIAGNOSIS — Z5181 Encounter for therapeutic drug level monitoring: Secondary | ICD-10-CM | POA: Diagnosis not present

## 2021-03-15 DIAGNOSIS — Z951 Presence of aortocoronary bypass graft: Secondary | ICD-10-CM | POA: Diagnosis not present

## 2021-03-15 DIAGNOSIS — Z95810 Presence of automatic (implantable) cardiac defibrillator: Secondary | ICD-10-CM | POA: Diagnosis not present

## 2021-03-15 DIAGNOSIS — I10 Essential (primary) hypertension: Secondary | ICD-10-CM | POA: Diagnosis not present

## 2021-03-15 DIAGNOSIS — E785 Hyperlipidemia, unspecified: Secondary | ICD-10-CM | POA: Diagnosis not present

## 2021-03-15 DIAGNOSIS — Z9181 History of falling: Secondary | ICD-10-CM | POA: Diagnosis not present

## 2021-03-15 DIAGNOSIS — I130 Hypertensive heart and chronic kidney disease with heart failure and stage 1 through stage 4 chronic kidney disease, or unspecified chronic kidney disease: Secondary | ICD-10-CM | POA: Diagnosis not present

## 2021-03-15 DIAGNOSIS — K219 Gastro-esophageal reflux disease without esophagitis: Secondary | ICD-10-CM | POA: Diagnosis not present

## 2021-03-15 DIAGNOSIS — J45909 Unspecified asthma, uncomplicated: Secondary | ICD-10-CM | POA: Diagnosis not present

## 2021-03-15 DIAGNOSIS — I2510 Atherosclerotic heart disease of native coronary artery without angina pectoris: Secondary | ICD-10-CM | POA: Diagnosis not present

## 2021-03-15 DIAGNOSIS — M5431 Sciatica, right side: Secondary | ICD-10-CM | POA: Diagnosis not present

## 2021-03-15 DIAGNOSIS — I255 Ischemic cardiomyopathy: Secondary | ICD-10-CM | POA: Diagnosis not present

## 2021-03-15 DIAGNOSIS — M109 Gout, unspecified: Secondary | ICD-10-CM | POA: Diagnosis not present

## 2021-03-15 DIAGNOSIS — F064 Anxiety disorder due to known physiological condition: Secondary | ICD-10-CM | POA: Diagnosis not present

## 2021-03-15 DIAGNOSIS — I5022 Chronic systolic (congestive) heart failure: Secondary | ICD-10-CM | POA: Diagnosis not present

## 2021-03-15 DIAGNOSIS — Z952 Presence of prosthetic heart valve: Secondary | ICD-10-CM | POA: Diagnosis not present

## 2021-03-15 DIAGNOSIS — I252 Old myocardial infarction: Secondary | ICD-10-CM | POA: Diagnosis not present

## 2021-03-15 DIAGNOSIS — N182 Chronic kidney disease, stage 2 (mild): Secondary | ICD-10-CM | POA: Diagnosis not present

## 2021-03-16 ENCOUNTER — Encounter (HOSPITAL_COMMUNITY): Payer: Medicaid Other | Admitting: Internal Medicine

## 2021-03-16 DIAGNOSIS — Z7901 Long term (current) use of anticoagulants: Secondary | ICD-10-CM | POA: Diagnosis not present

## 2021-03-16 DIAGNOSIS — Z95811 Presence of heart assist device: Secondary | ICD-10-CM | POA: Diagnosis not present

## 2021-03-16 DIAGNOSIS — I5023 Acute on chronic systolic (congestive) heart failure: Secondary | ICD-10-CM | POA: Diagnosis not present

## 2021-03-16 DIAGNOSIS — I10 Essential (primary) hypertension: Secondary | ICD-10-CM | POA: Diagnosis not present

## 2021-03-17 ENCOUNTER — Other Ambulatory Visit (HOSPITAL_COMMUNITY): Payer: Self-pay | Admitting: *Deleted

## 2021-03-17 ENCOUNTER — Telehealth (HOSPITAL_COMMUNITY): Payer: Self-pay | Admitting: *Deleted

## 2021-03-17 ENCOUNTER — Encounter (HOSPITAL_COMMUNITY): Payer: Self-pay | Admitting: *Deleted

## 2021-03-17 DIAGNOSIS — I5022 Chronic systolic (congestive) heart failure: Secondary | ICD-10-CM

## 2021-03-17 DIAGNOSIS — Z95811 Presence of heart assist device: Secondary | ICD-10-CM

## 2021-03-17 DIAGNOSIS — D509 Iron deficiency anemia, unspecified: Secondary | ICD-10-CM

## 2021-03-17 NOTE — Telephone Encounter (Addendum)
Called patient per Dr. Haroldine Laws to schedule first [facility] appointment here as Share Care patient with [facility]. Patient is in agreement.   Also, patient needs IV iron and Cardiac Rehab here at [facility], pt in agreement.   Following appointments made and shared with patient; letter sent as well.   I have scheduled your IV iron administration as follows:       Friday, March 25, 2021 at 9:00 am - please check in at Admissions at [facility]  I have you scheduled for your echo on:       Thursday, April 14, 2021 at 08:00 am - check in HF clinic reception   We have scheduled your first [facility] appointment with [place] on:       Thursday, April 14, 2021 at 09:00 am - parking code for April is [id number]  I have also sent a referral to [facility] for you per Dr. Haroldine Laws. Cardiac Rehab will be calling you, but as we discussed, they may have a wait time for first class.   VAD Clinic contact information shared with patient. He verbalized understanding of above, and will call if any questions.  [place], [place] Coordinatorl [phone]

## 2021-03-17 NOTE — Progress Notes (Signed)
cardi

## 2021-03-21 ENCOUNTER — Encounter (HOSPITAL_COMMUNITY): Payer: Self-pay | Admitting: *Deleted

## 2021-03-21 NOTE — Progress Notes (Addendum)
Received cardiac rehab referral from Dr. Haroldine Laws for this pt to participate in cardiac rehab phase II with the diagnosis of chronic systolic heart failure. Pt is s/p VAD placement 05/2020 at [facility] progress notes in [facility] as well as follow up notes in [place].  Pt has upcoming initial appt with HF/VAD clinic on 4/21.  Will send medicaid reimbursement form for MD completion.  Once received and pt qualifies, will contact for scheduling. Will forward to support staff to make the initial call to pt for benefit eligibility and the referral/scheduling process. Cherre Huger, BSN Cardiac and Training and development officer

## 2021-03-22 DIAGNOSIS — I2510 Atherosclerotic heart disease of native coronary artery without angina pectoris: Secondary | ICD-10-CM | POA: Diagnosis not present

## 2021-03-22 DIAGNOSIS — Z85850 Personal history of malignant neoplasm of thyroid: Secondary | ICD-10-CM | POA: Diagnosis not present

## 2021-03-22 DIAGNOSIS — G8929 Other chronic pain: Secondary | ICD-10-CM | POA: Diagnosis not present

## 2021-03-22 DIAGNOSIS — F064 Anxiety disorder due to known physiological condition: Secondary | ICD-10-CM | POA: Diagnosis not present

## 2021-03-22 DIAGNOSIS — I252 Old myocardial infarction: Secondary | ICD-10-CM | POA: Diagnosis not present

## 2021-03-22 DIAGNOSIS — I5023 Acute on chronic systolic (congestive) heart failure: Secondary | ICD-10-CM | POA: Diagnosis not present

## 2021-03-22 DIAGNOSIS — Z951 Presence of aortocoronary bypass graft: Secondary | ICD-10-CM | POA: Diagnosis not present

## 2021-03-22 DIAGNOSIS — E785 Hyperlipidemia, unspecified: Secondary | ICD-10-CM | POA: Diagnosis not present

## 2021-03-22 DIAGNOSIS — Z7982 Long term (current) use of aspirin: Secondary | ICD-10-CM | POA: Diagnosis not present

## 2021-03-22 DIAGNOSIS — Z95810 Presence of automatic (implantable) cardiac defibrillator: Secondary | ICD-10-CM | POA: Diagnosis not present

## 2021-03-22 DIAGNOSIS — I340 Nonrheumatic mitral (valve) insufficiency: Secondary | ICD-10-CM | POA: Diagnosis not present

## 2021-03-22 DIAGNOSIS — I130 Hypertensive heart and chronic kidney disease with heart failure and stage 1 through stage 4 chronic kidney disease, or unspecified chronic kidney disease: Secondary | ICD-10-CM | POA: Diagnosis not present

## 2021-03-22 DIAGNOSIS — I255 Ischemic cardiomyopathy: Secondary | ICD-10-CM | POA: Diagnosis not present

## 2021-03-22 DIAGNOSIS — Z5181 Encounter for therapeutic drug level monitoring: Secondary | ICD-10-CM | POA: Diagnosis not present

## 2021-03-22 DIAGNOSIS — M109 Gout, unspecified: Secondary | ICD-10-CM | POA: Diagnosis not present

## 2021-03-22 DIAGNOSIS — Z7901 Long term (current) use of anticoagulants: Secondary | ICD-10-CM | POA: Diagnosis not present

## 2021-03-22 DIAGNOSIS — K219 Gastro-esophageal reflux disease without esophagitis: Secondary | ICD-10-CM | POA: Diagnosis not present

## 2021-03-22 DIAGNOSIS — I5022 Chronic systolic (congestive) heart failure: Secondary | ICD-10-CM | POA: Diagnosis not present

## 2021-03-22 DIAGNOSIS — D631 Anemia in chronic kidney disease: Secondary | ICD-10-CM | POA: Diagnosis not present

## 2021-03-22 DIAGNOSIS — Z952 Presence of prosthetic heart valve: Secondary | ICD-10-CM | POA: Diagnosis not present

## 2021-03-22 DIAGNOSIS — J45909 Unspecified asthma, uncomplicated: Secondary | ICD-10-CM | POA: Diagnosis not present

## 2021-03-22 DIAGNOSIS — N182 Chronic kidney disease, stage 2 (mild): Secondary | ICD-10-CM | POA: Diagnosis not present

## 2021-03-22 DIAGNOSIS — Z79891 Long term (current) use of opiate analgesic: Secondary | ICD-10-CM | POA: Diagnosis not present

## 2021-03-22 DIAGNOSIS — M5431 Sciatica, right side: Secondary | ICD-10-CM | POA: Diagnosis not present

## 2021-03-22 DIAGNOSIS — Z9181 History of falling: Secondary | ICD-10-CM | POA: Diagnosis not present

## 2021-03-24 NOTE — Discharge Instructions (Signed)
Ferumoxytol injection What is this medicine? FERUMOXYTOL is an iron complex. Iron is used to make healthy red blood cells, which carry oxygen and nutrients throughout the body. This medicine is used to treat iron deficiency anemia. This medicine may be used for other purposes; ask your health care provider or pharmacist if you have questions. COMMON BRAND NAME(S): Feraheme What should I tell my health care provider before I take this medicine? They need to know if you have any of these conditions:  anemia not caused by low iron levels  high levels of iron in the blood  magnetic resonance imaging (MRI) test scheduled  an unusual or allergic reaction to iron, other medicines, foods, dyes, or preservatives  pregnant or trying to get pregnant  breast-feeding How should I use this medicine? This medicine is for injection into a vein. It is given by a health care professional in a hospital or clinic setting. Talk to your pediatrician regarding the use of this medicine in children. Special care may be needed. Overdosage: If you think you have taken too much of this medicine contact a poison control center or emergency room at once. NOTE: This medicine is only for you. Do not share this medicine with others. What if I miss a dose? It is important not to miss your dose. Call your doctor or health care professional if you are unable to keep an appointment. What may interact with this medicine? This medicine may interact with the following medications:  other iron products This list may not describe all possible interactions. Give your health care provider a list of all the medicines, herbs, non-prescription drugs, or dietary supplements you use. Also tell them if you smoke, drink alcohol, or use illegal drugs. Some items may interact with your medicine. What should I watch for while using this medicine? Visit your doctor or healthcare professional regularly. Tell your doctor or healthcare  professional if your symptoms do not start to get better or if they get worse. You may need blood work done while you are taking this medicine. You may need to follow a special diet. Talk to your doctor. Foods that contain iron include: whole grains/cereals, dried fruits, beans, or peas, leafy green vegetables, and organ meats (liver, kidney). What side effects may I notice from receiving this medicine? Side effects that you should report to your doctor or health care professional as soon as possible:  allergic reactions like skin rash, itching or hives, swelling of the face, lips, or tongue  breathing problems  changes in blood pressure  feeling faint or lightheaded, falls  fever or chills  flushing, sweating, or hot feelings  swelling of the ankles or feet Side effects that usually do not require medical attention (report to your doctor or health care professional if they continue or are bothersome):  diarrhea  headache  nausea, vomiting  stomach pain This list may not describe all possible side effects. Call your doctor for medical advice about side effects. You may report side effects to FDA at 1-800-FDA-1088. Where should I keep my medicine? This drug is given in a hospital or clinic and will not be stored at home. NOTE: This sheet is a summary. It may not cover all possible information. If you have questions about this medicine, talk to your doctor, pharmacist, or health care provider.  2021 Elsevier/Gold Standard (2017-01-29 20:21:10)  

## 2021-03-25 ENCOUNTER — Encounter (HOSPITAL_COMMUNITY): Payer: Self-pay

## 2021-03-25 ENCOUNTER — Inpatient Hospital Stay (HOSPITAL_COMMUNITY)
Admission: RE | Admit: 2021-03-25 | Discharge: 2021-03-25 | Disposition: A | Discharge status: Home / Self Care | Payer: Medicaid Other | Source: Ambulatory Visit | Attending: Internal Medicine | Admitting: Internal Medicine

## 2021-03-29 ENCOUNTER — Telehealth (HOSPITAL_COMMUNITY): Payer: Self-pay | Admitting: *Deleted

## 2021-03-29 ENCOUNTER — Telehealth: Payer: Self-pay

## 2021-03-29 ENCOUNTER — Other Ambulatory Visit (HOSPITAL_COMMUNITY): Payer: Self-pay | Admitting: *Deleted

## 2021-03-29 DIAGNOSIS — Z7982 Long term (current) use of aspirin: Secondary | ICD-10-CM | POA: Diagnosis not present

## 2021-03-29 DIAGNOSIS — I255 Ischemic cardiomyopathy: Secondary | ICD-10-CM | POA: Diagnosis not present

## 2021-03-29 DIAGNOSIS — D631 Anemia in chronic kidney disease: Secondary | ICD-10-CM | POA: Diagnosis not present

## 2021-03-29 DIAGNOSIS — J45909 Unspecified asthma, uncomplicated: Secondary | ICD-10-CM | POA: Diagnosis not present

## 2021-03-29 DIAGNOSIS — I5022 Chronic systolic (congestive) heart failure: Secondary | ICD-10-CM | POA: Diagnosis not present

## 2021-03-29 DIAGNOSIS — I340 Nonrheumatic mitral (valve) insufficiency: Secondary | ICD-10-CM | POA: Diagnosis not present

## 2021-03-29 DIAGNOSIS — Z7901 Long term (current) use of anticoagulants: Secondary | ICD-10-CM | POA: Diagnosis not present

## 2021-03-29 DIAGNOSIS — E785 Hyperlipidemia, unspecified: Secondary | ICD-10-CM | POA: Diagnosis not present

## 2021-03-29 DIAGNOSIS — Z95810 Presence of automatic (implantable) cardiac defibrillator: Secondary | ICD-10-CM | POA: Diagnosis not present

## 2021-03-29 DIAGNOSIS — K219 Gastro-esophageal reflux disease without esophagitis: Secondary | ICD-10-CM | POA: Diagnosis not present

## 2021-03-29 DIAGNOSIS — Z5181 Encounter for therapeutic drug level monitoring: Secondary | ICD-10-CM | POA: Diagnosis not present

## 2021-03-29 DIAGNOSIS — Z9181 History of falling: Secondary | ICD-10-CM | POA: Diagnosis not present

## 2021-03-29 DIAGNOSIS — I130 Hypertensive heart and chronic kidney disease with heart failure and stage 1 through stage 4 chronic kidney disease, or unspecified chronic kidney disease: Secondary | ICD-10-CM | POA: Diagnosis not present

## 2021-03-29 DIAGNOSIS — Z952 Presence of prosthetic heart valve: Secondary | ICD-10-CM | POA: Diagnosis not present

## 2021-03-29 DIAGNOSIS — Z951 Presence of aortocoronary bypass graft: Secondary | ICD-10-CM | POA: Diagnosis not present

## 2021-03-29 DIAGNOSIS — Z95811 Presence of heart assist device: Secondary | ICD-10-CM

## 2021-03-29 DIAGNOSIS — M109 Gout, unspecified: Secondary | ICD-10-CM | POA: Diagnosis not present

## 2021-03-29 DIAGNOSIS — I252 Old myocardial infarction: Secondary | ICD-10-CM | POA: Diagnosis not present

## 2021-03-29 DIAGNOSIS — N182 Chronic kidney disease, stage 2 (mild): Secondary | ICD-10-CM | POA: Diagnosis not present

## 2021-03-29 DIAGNOSIS — Z85850 Personal history of malignant neoplasm of thyroid: Secondary | ICD-10-CM | POA: Diagnosis not present

## 2021-03-29 DIAGNOSIS — Z79891 Long term (current) use of opiate analgesic: Secondary | ICD-10-CM | POA: Diagnosis not present

## 2021-03-29 DIAGNOSIS — M5431 Sciatica, right side: Secondary | ICD-10-CM | POA: Diagnosis not present

## 2021-03-29 DIAGNOSIS — G8929 Other chronic pain: Secondary | ICD-10-CM | POA: Diagnosis not present

## 2021-03-29 DIAGNOSIS — F064 Anxiety disorder due to known physiological condition: Secondary | ICD-10-CM | POA: Diagnosis not present

## 2021-03-29 DIAGNOSIS — I2510 Atherosclerotic heart disease of native coronary artery without angina pectoris: Secondary | ICD-10-CM | POA: Diagnosis not present

## 2021-03-29 NOTE — Telephone Encounter (Signed)
Tanda Rockers, rn from [facility] clinicstates the patient called stating he was not feeling well. She states the patient sent a transmission but we did not receive the transmission. I let her know it states communicator is not connecting. I gave the patient the number to [employer] tech support to get additional help. I told her when we see the transmission the nurse will review it and send a staff message or phone note to her about the transmission.

## 2021-03-29 NOTE — Telephone Encounter (Signed)
Called patient per Dr. Haroldine Laws. Patient not feeling well, VAD clinic appt moved up to tomorrow based on patient's request. Complete echo re-scheduled to Friday 04/01/21 at 8:15. Dr. Haroldine Laws will confirm whether he needs the echo or not.   Pt says he feels "awful", feels his heart is "racing", and has gained 5 lbs of wt over last few days.  His last instructions from [facility] were to change Torsemide to 20 mg prn. He reports he has taken one dose today and one dose yesterday.   He has been trying to send remote transmissions to device clinic, nothing noted. [facility] and found they have not been receiving. Gave patient [employer] tech support # to call and trouble shoot home monitor. Also gave patient Device Clinic # and asked him to call them and explain symptoms and when he sends transmission. Pt verbalized understanding of same.   Current VAD parameters: Speed   5800 Flow   4.8 PI 3.5 Power 4.5  Pt denies any VAD alarms, syncope, or heart failure symptoms.   Patient will come to clinic appt tomorrow at 11:00. Advised to come to ED if symptoms worsen or do not resolve. Pt verbalized understanding of above.  Zada Girt RN, [place] Coordinator [phone]

## 2021-03-30 ENCOUNTER — Other Ambulatory Visit: Payer: Self-pay

## 2021-03-30 ENCOUNTER — Other Ambulatory Visit (HOSPITAL_COMMUNITY): Payer: Self-pay | Admitting: Internal Medicine

## 2021-03-30 ENCOUNTER — Ambulatory Visit (HOSPITAL_COMMUNITY)
Admission: RE | Admit: 2021-03-30 | Discharge: 2021-03-30 | Disposition: A | Discharge status: Home / Self Care | Payer: Medicaid Other | Source: Ambulatory Visit | Attending: Internal Medicine | Admitting: Internal Medicine

## 2021-03-30 ENCOUNTER — Other Ambulatory Visit (HOSPITAL_COMMUNITY): Payer: Self-pay | Admitting: *Deleted

## 2021-03-30 VITALS — BP 119/51 | HR 71 | Wt 176.8 lb

## 2021-03-30 DIAGNOSIS — N1832 Chronic kidney disease, stage 3b: Secondary | ICD-10-CM | POA: Insufficient documentation

## 2021-03-30 DIAGNOSIS — I130 Hypertensive heart and chronic kidney disease with heart failure and stage 1 through stage 4 chronic kidney disease, or unspecified chronic kidney disease: Secondary | ICD-10-CM | POA: Insufficient documentation

## 2021-03-30 DIAGNOSIS — I5022 Chronic systolic (congestive) heart failure: Secondary | ICD-10-CM | POA: Diagnosis not present

## 2021-03-30 DIAGNOSIS — I493 Ventricular premature depolarization: Secondary | ICD-10-CM

## 2021-03-30 DIAGNOSIS — I2510 Atherosclerotic heart disease of native coronary artery without angina pectoris: Secondary | ICD-10-CM

## 2021-03-30 DIAGNOSIS — I252 Old myocardial infarction: Secondary | ICD-10-CM | POA: Insufficient documentation

## 2021-03-30 DIAGNOSIS — Z95811 Presence of heart assist device: Secondary | ICD-10-CM

## 2021-03-30 DIAGNOSIS — Z7982 Long term (current) use of aspirin: Secondary | ICD-10-CM | POA: Diagnosis not present

## 2021-03-30 DIAGNOSIS — D509 Iron deficiency anemia, unspecified: Secondary | ICD-10-CM

## 2021-03-30 DIAGNOSIS — Z7901 Long term (current) use of anticoagulants: Secondary | ICD-10-CM | POA: Insufficient documentation

## 2021-03-30 DIAGNOSIS — Z951 Presence of aortocoronary bypass graft: Secondary | ICD-10-CM | POA: Insufficient documentation

## 2021-03-30 DIAGNOSIS — Z79899 Other long term (current) drug therapy: Secondary | ICD-10-CM | POA: Diagnosis not present

## 2021-03-30 DIAGNOSIS — Z882 Allergy status to sulfonamides status: Secondary | ICD-10-CM | POA: Insufficient documentation

## 2021-03-30 DIAGNOSIS — Z95810 Presence of automatic (implantable) cardiac defibrillator: Secondary | ICD-10-CM | POA: Insufficient documentation

## 2021-03-30 DIAGNOSIS — I255 Ischemic cardiomyopathy: Secondary | ICD-10-CM | POA: Diagnosis not present

## 2021-03-30 DIAGNOSIS — I5023 Acute on chronic systolic (congestive) heart failure: Secondary | ICD-10-CM | POA: Diagnosis not present

## 2021-03-30 DIAGNOSIS — R002 Palpitations: Secondary | ICD-10-CM

## 2021-03-30 LAB — CBC
HCT: 34.9 % — ABNORMAL LOW (ref 39.0–52.0)
Hemoglobin: 10.7 g/dL — ABNORMAL LOW (ref 13.0–17.0)
MCH: 25.2 pg — ABNORMAL LOW (ref 26.0–34.0)
MCHC: 30.7 g/dL (ref 30.0–36.0)
MCV: 82.1 fL (ref 80.0–100.0)
Platelets: 125 10*3/uL — ABNORMAL LOW (ref 150–400)
RBC: 4.25 MIL/uL (ref 4.22–5.81)
RDW: 17.8 % — ABNORMAL HIGH (ref 11.5–15.5)
WBC: 3.8 10*3/uL — ABNORMAL LOW (ref 4.0–10.5)
nRBC: 0 % (ref 0.0–0.2)

## 2021-03-30 LAB — COMPREHENSIVE METABOLIC PANEL
ALT: 19 U/L (ref 0–44)
AST: 26 U/L (ref 15–41)
Albumin: 3.6 g/dL (ref 3.5–5.0)
Alkaline Phosphatase: 93 U/L (ref 38–126)
Anion gap: 4 — ABNORMAL LOW (ref 5–15)
BUN: 24 mg/dL — ABNORMAL HIGH (ref 6–20)
CO2: 30 mmol/L (ref 22–32)
Calcium: 8.5 mg/dL — ABNORMAL LOW (ref 8.9–10.3)
Chloride: 101 mmol/L (ref 98–111)
Creatinine, Ser: 1.42 mg/dL — ABNORMAL HIGH (ref 0.61–1.24)
GFR, Estimated: 57 mL/min — ABNORMAL LOW (ref >60)
Glucose, Bld: 94 mg/dL (ref 70–99)
Potassium: 4.4 mmol/L (ref 3.5–5.1)
Sodium: 135 mmol/L (ref 135–145)
Total Bilirubin: 0.5 mg/dL (ref 0.3–1.2)
Total Protein: 7.1 g/dL (ref 6.5–8.1)

## 2021-03-30 LAB — PROTIME-INR
INR: 2 — ABNORMAL HIGH (ref 0.8–1.2)
Prothrombin Time: 21.6 seconds — ABNORMAL HIGH (ref 11.4–15.2)

## 2021-03-30 LAB — PREALBUMIN: Prealbumin: 23.2 mg/dL (ref 18–38)

## 2021-03-30 LAB — LACTATE DEHYDROGENASE: LDH: 202 U/L — ABNORMAL HIGH (ref 98–192)

## 2021-03-30 MED ORDER — TORSEMIDE 20 MG PO TABS: 40.0000 mg | ORAL_TABLET | Freq: Every day | ORAL | 4 refills | Status: DC
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
## 2021-03-30 NOTE — Patient Instructions (Signed)
1. Increase Torsemide to 40 mg daily in the morning. Take Potassium 20 meq with Torsemide. 2. Coumadin dosing per [facility]  3. We will schedule you for IV iron infusion and ECHO and call you with appointment dates/times 4. We placed a Zio patch today to monitor for any arrhythmias. If flashing red or orange device is not working, please call clinic. If patch falls off, please call clinic.  5. Return to [place] [facility] in 1 week

## 2021-03-30 NOTE — Progress Notes (Addendum)
Advanced Heart Failure Clinic Note   Patient ID: Chad Bark., male   DOB: 13-Nov-1961, 60 y.o.   MRN: [id number]   Primary Cardiologist:  Dr. Glori Bickers   History of Present Illness:  Chad [name] is a 60 y.o. male with aevere HTN, CAD s/p MI with CABG 2006 and DES to native PDA in 3329, systolic HF EF 51-88% s/p BOsSCI ICD. 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  Underwent high risk HM-III VAD placement at [facility] 6/21.   He is here for shared care LVAD sick visit. Has been following closely at [facility].   Echo 3/22 EF 20% RV moderate HK.  RHC with exercise 3/22 on 5600  Rest RA 9 PCWP 12 CI 2.4 MAP 100 Exercise PCWP 27 CI 2.5 pVO2 7.4 slopw 42  Has been struggling with volume overload and HTN. Also having palpitations. Reports yesterday he "felt like death was sitting on my shoulders." Reports yesterday he was having frequent palpitations and lightheaded episodes. (no VT on ICD interrogation today). Started on torsemide 20 daily by [facility] several days ago. Says he feels great today. Denies orthopnea or PND. No fevers, chills or problems with driveline. No bleeding, melena or neuro symptoms. No VAD alarms. Taking all meds as prescribed.     VAD Indication: Destination Therapy- HM III implanted at [facility] 06/04/20  VAD interrogation: Speed: 5800 Flow: 4.4 Power: 4.5 w PI: 4.6 Alarms: few low voltage Events: 5-15 PI events daily  Fixed speed: 5800 Low speed limit: 5500  Primary controller: back up battery due for replacement in 22 months Secondary controller: back up battery due for replacement in  months-- did not bring today   Review of systems complete and found to be negative unless listed in HPI.   Past Medical History:  Diagnosis Date  .  AICD (automatic cardioverter/defibrillator) present   . Anemia   . Anginal pain ([place])   . Anxiety   . Aortic dissection, thoracoabdominal ([place])    7/10: Type I s/p repair  . Arthritis   . Asthma   . CAD (coronary artery disease)    a. s/p CABG 2006;  b. DES to PDA 2011 (cath: Dx not seen, dRCA/PDA tx with DES; S-PDA occluded (culprit), S-Dx occluded, S-RI and OM ok, L-LAD ok  . Carotid stenosis    dopplers 2011: 0-39% bilat.  . Chest pain syndrome   . CHF (congestive heart failure) ([place])   . Chronic bronchitis ([place])   . Chronic lower back pain   . Chronic systolic heart failure (HCC)    a. 12/13 ECHO: EF 35-40%, sept, apical & posterobasal HK, LV mod dil & sys fx mod reduced, mild AI, MV mild reg, TV mild reg  . Complication of anesthesia    "difficult to wake afterwards a couple of times" and patient states he has woken during surgery   . 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 Dyspnea    patient denies at 12/06/2018 appt   . Family history of adverse reaction to anesthesia    "sister hard to wake up"  . GERD (gastroesophageal reflux disease)   . Gout   . Heart murmur   . HLD (hyperlipidemia)   . HTN (hypertension)  severe  . Myocardial infarction [facility])    "many" (11/07/2016)  . PONV (postoperative nausea and vomiting)   . Sleep apnea    does not use mask   . Thyroid cancer ([place])    Hertle Cell    Current Outpatient Medications  Medication Sig Dispense Refill  . acetaminophen (TYLENOL) 325 MG tablet Take 2 tablets (650 mg total) by mouth every 4 (four) hours as needed for headache or mild pain.    [name] aspirin EC 81 MG tablet Take 81 mg by mouth every evening.    . carvedilol (COREG) 12.5 MG tablet Take 12.5 mg by mouth 2 (two) times daily.    [name] FARXIGA 5 MG TABS tablet Take 5 mg by mouth every morning.    . fluticasone (FLONASE) 50 MCG/ACT nasal spray Place 2 sprays into both nostrils daily as needed for  allergies or rhinitis.     [name] gabapentin (NEURONTIN) 300 MG capsule Take 1 capsule (300 mg total) by mouth at bedtime. (Patient taking differently: Take 300 mg by mouth 3 (three) times daily.) 30 capsule 3  . hydrALAZINE (APRESOLINE) 25 MG tablet Take 100 mg by mouth 3 (three) times daily.    . isosorbide mononitrate (IMDUR) 30 MG 24 hr tablet Take 30 mg by mouth daily.    . methocarbamol (ROBAXIN) 750 MG tablet SMARTSIG:2 Tablet(s) By Mouth Every 12 Hours    . MITIGARE 0.6 MG CAPS Take 2 capsules by mouth twice daily 120 capsule 0  . Oxycodone HCl 20 MG TABS Take 20 mg by mouth every 6 (six) hours as needed (For pain).     . potassium chloride (KLOR-CON) 20 MEQ packet Take 20 mEq by mouth as directed.    . sacubitril-valsartan (ENTRESTO) 97-103 MG Take 1 tablet by mouth every 12 (twelve) hours.    [name] spironolactone (ALDACTONE) 25 MG tablet Take 1 tablet by mouth daily.    . Alirocumab (PRALUENT) 75 MG/ML SOAJ Inject 75 mg into the skin every 14 (fourteen) days. (Patient not taking: Reported on 03/30/2021) 2 mL 11  . alum & mag hydroxide-simeth (MAALOX/MYLANTA) 200-200-20 MG/5ML suspension Take 15 mLs by mouth every 6 (six) hours as needed for indigestion or heartburn. (Patient not taking: Reported on 03/30/2021) 355 mL 0  . nitroGLYCERIN (NITROSTAT) 0.4 MG SL tablet Place 0.4 mg under the tongue every 5 (five) minutes as needed for chest pain. (Patient not taking: Reported on 03/30/2021)    . torsemide (DEMADEX) 20 MG tablet Take 2 tablets (40 mg total) by mouth daily. 60 tablet 4  . warfarin (COUMADIN) 2 MG tablet Take by mouth. 7 mg daily per [facility] Failure Team     No current facility-administered medications for this encounter.    Allergies  Allergen Reactions  . Contrast Media [Iodinated Diagnostic Agents] Anaphylaxis  . Iohexol Anaphylaxis and Other (See Comments)    PT HAS ANAPHYLAXIS WITH CONTRAST MEDIA!  . Lipitor [Atorvastatin Calcium] Anaphylaxis and Other (See Comments)     Large doses  . Shellfish Allergy Anaphylaxis  . Sulfa Antibiotics Shortness Of Breath and Swelling  . Sulfonamide Derivatives Shortness Of Breath and Swelling  . Almond Oil Itching    FACIAL/MOUTH ITCHING  . Metrizamide Swelling    SWELLING REACTION UNSPECIFIED   . Zocor [Simvastatin] Other (See Comments)    Muscle cramps  . Food Itching    Shellfish, peaches, almonds, apples & kiwis  . Latex Rash and Other (See Comments)    With long periods of exposure  .  Peach [Prunus Persica] Itching    Vital Signs: Vitals:   03/30/21 1145 03/30/21 1146  BP: (!) 106/0 (!) 119/51  Pulse: 71   SpO2: 96%   Weight: 80.2 kg (176 lb 12.8 oz)    Filed Weights   03/30/21 1145  Weight: 80.2 kg (176 lb 12.8 oz)    Wt Readings from Last 3 Encounters:  03/30/21 80.2 kg (176 lb 12.8 oz)  03/07/21 80.4 kg (177 lb 3.2 oz)  11/02/20 78.7 kg (173 lb 6.4 oz)    Vital Signs:  Doppler Pressure: 106 Automatc BP: 120/67 (92) HR: 80 SR SPO2: 96 %  Weight: 176.8 lb w/o eqt Last weight:  lb Home weights: 177.4 lbs  General:  NAD.  HEENT: normal  Neck: supple. JVP 10  Carotids 2+ bilat; no bruits. No lymphadenopathy or thryomegaly appreciated. Cor: LVAD hum.  Lungs: Clear. Abdomen: obese soft, nontender, non-distended. No hepatosplenomegaly. No bruits or masses. Good bowel sounds. Driveline site clean. Anchor in place.  Extremities: no cyanosis, clubbing, rash. Warm 2-3+ edema  Neuro: alert & oriented x 3. No focal deficits. Moves all 4 without problem    ASSESSMENT/PLAN:  1. Chronic systolic HF: Ischemic cardiomyopathy.  Echo (2/16) with EF 25%. S/p [employer] ICD. Echo 3/18. EF 20-25%. Echo 04/18/18: EF 20-25%, grade 2 DD. - s/p HM-III VAD at [facility] 6/21 - Echo and RHC at [facility] 3/22 (details above) - He is NYHA III with severely depressed Vo2 - Markedly volume overloaded.  - Will focus on getting fluid off first then will refer to CR - Increase torsemide to 40 daily - Continue  Entresto 97/103 bid - Continue spiro 25 daily - Continue Farxiga 5 daily - Continue carvedilol 12.5 bid  - Continue hydral 100 tid and Imdur 30 daily - See back 1 week with labs  2. CAD s/p CABG 2006:  - stable - no s/s angina  3. VAD management - VAD interrogated personally. Parameters stable. - MAPs improving but still mildly elevated. Hopefully will improve with diuresis - DL site ok - INR managed by [facility]  4. 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 HTN:  - Remains elevated but improved. - Plan as above  6. Palpitations - ICD interrogated personally in clinic. No VT - Place Zio to look for AF  7. Iron-deficiency anemia - will schedule Feraheme infusion x 3. D/w Pharm D  Total time spent 45 minutes. Over half that time spent discussing above.    Glori Bickers, MD 3:36 PM

## 2021-04-01 ENCOUNTER — Ambulatory Visit (HOSPITAL_COMMUNITY): Payer: Medicaid Other

## 2021-04-01 NOTE — Telephone Encounter (Signed)
Transmission received on 03/31/21.

## 2021-04-01 NOTE — Telephone Encounter (Signed)
Transmission received 03/31/2021

## 2021-04-03 ENCOUNTER — Emergency Department (HOSPITAL_COMMUNITY): Payer: Medicaid Other

## 2021-04-03 ENCOUNTER — Emergency Department (HOSPITAL_COMMUNITY)
Admission: EM | Admit: 2021-04-03 | Discharge: 2021-04-03 | Disposition: A | Discharge status: Home / Self Care | Payer: Medicaid Other | Attending: Emergency Medicine | Admitting: Emergency Medicine

## 2021-04-03 DIAGNOSIS — N289 Disorder of kidney and ureter, unspecified: Secondary | ICD-10-CM | POA: Diagnosis not present

## 2021-04-03 DIAGNOSIS — I2510 Atherosclerotic heart disease of native coronary artery without angina pectoris: Secondary | ICD-10-CM | POA: Diagnosis not present

## 2021-04-03 DIAGNOSIS — D696 Thrombocytopenia, unspecified: Secondary | ICD-10-CM | POA: Diagnosis not present

## 2021-04-03 DIAGNOSIS — Z955 Presence of coronary angioplasty implant and graft: Secondary | ICD-10-CM | POA: Diagnosis not present

## 2021-04-03 DIAGNOSIS — Z95810 Presence of automatic (implantable) cardiac defibrillator: Secondary | ICD-10-CM | POA: Diagnosis not present

## 2021-04-03 DIAGNOSIS — N183 Chronic kidney disease, stage 3 unspecified: Secondary | ICD-10-CM | POA: Diagnosis not present

## 2021-04-03 DIAGNOSIS — R531 Weakness: Secondary | ICD-10-CM

## 2021-04-03 DIAGNOSIS — D649 Anemia, unspecified: Secondary | ICD-10-CM | POA: Insufficient documentation

## 2021-04-03 DIAGNOSIS — I130 Hypertensive heart and chronic kidney disease with heart failure and stage 1 through stage 4 chronic kidney disease, or unspecified chronic kidney disease: Secondary | ICD-10-CM | POA: Insufficient documentation

## 2021-04-03 DIAGNOSIS — Z7901 Long term (current) use of anticoagulants: Secondary | ICD-10-CM | POA: Diagnosis not present

## 2021-04-03 DIAGNOSIS — Z85828 Personal history of other malignant neoplasm of skin: Secondary | ICD-10-CM | POA: Insufficient documentation

## 2021-04-03 DIAGNOSIS — Z79899 Other long term (current) drug therapy: Secondary | ICD-10-CM | POA: Insufficient documentation

## 2021-04-03 DIAGNOSIS — J45909 Unspecified asthma, uncomplicated: Secondary | ICD-10-CM | POA: Diagnosis not present

## 2021-04-03 DIAGNOSIS — Z95811 Presence of heart assist device: Secondary | ICD-10-CM | POA: Diagnosis not present

## 2021-04-03 DIAGNOSIS — Z7982 Long term (current) use of aspirin: Secondary | ICD-10-CM | POA: Insufficient documentation

## 2021-04-03 DIAGNOSIS — I10 Essential (primary) hypertension: Secondary | ICD-10-CM | POA: Diagnosis not present

## 2021-04-03 DIAGNOSIS — I5042 Chronic combined systolic (congestive) and diastolic (congestive) heart failure: Secondary | ICD-10-CM | POA: Insufficient documentation

## 2021-04-03 DIAGNOSIS — Z91040 Latex allergy status: Secondary | ICD-10-CM | POA: Diagnosis not present

## 2021-04-03 DIAGNOSIS — Z85850 Personal history of malignant neoplasm of thyroid: Secondary | ICD-10-CM | POA: Diagnosis not present

## 2021-04-03 LAB — CBC WITH DIFFERENTIAL/PLATELET
Abs Immature Granulocytes: 0.01 10*3/uL (ref 0.00–0.07)
Basophils Absolute: 0 10*3/uL (ref 0.0–0.1)
Basophils Relative: 1 %
Eosinophils Absolute: 0.2 10*3/uL (ref 0.0–0.5)
Eosinophils Relative: 6 %
HCT: 34.4 % — ABNORMAL LOW (ref 39.0–52.0)
Hemoglobin: 10.7 g/dL — ABNORMAL LOW (ref 13.0–17.0)
Immature Granulocytes: 0 %
Lymphocytes Relative: 24 %
Lymphs Abs: 0.9 10*3/uL (ref 0.7–4.0)
MCH: 24.9 pg — ABNORMAL LOW (ref 26.0–34.0)
MCHC: 31.1 g/dL (ref 30.0–36.0)
MCV: 80.2 fL (ref 80.0–100.0)
Monocytes Absolute: 0.4 10*3/uL (ref 0.1–1.0)
Monocytes Relative: 11 %
Neutro Abs: 2.1 10*3/uL (ref 1.7–7.7)
Neutrophils Relative %: 58 %
Platelets: 109 10*3/uL — ABNORMAL LOW (ref 150–400)
RBC: 4.29 MIL/uL (ref 4.22–5.81)
RDW: 17.7 % — ABNORMAL HIGH (ref 11.5–15.5)
WBC: 3.5 10*3/uL — ABNORMAL LOW (ref 4.0–10.5)
nRBC: 0 % (ref 0.0–0.2)

## 2021-04-03 LAB — COMPREHENSIVE METABOLIC PANEL
ALT: 16 U/L (ref 0–44)
AST: 22 U/L (ref 15–41)
Albumin: 3.4 g/dL — ABNORMAL LOW (ref 3.5–5.0)
Alkaline Phosphatase: 88 U/L (ref 38–126)
Anion gap: 8 (ref 5–15)
BUN: 20 mg/dL (ref 6–20)
CO2: 29 mmol/L (ref 22–32)
Calcium: 8.5 mg/dL — ABNORMAL LOW (ref 8.9–10.3)
Chloride: 98 mmol/L (ref 98–111)
Creatinine, Ser: 1.34 mg/dL — ABNORMAL HIGH (ref 0.61–1.24)
GFR, Estimated: >60 mL/min (ref >60)
Glucose, Bld: 105 mg/dL — ABNORMAL HIGH (ref 70–99)
Potassium: 4.4 mmol/L (ref 3.5–5.1)
Sodium: 135 mmol/L (ref 135–145)
Total Bilirubin: 0.8 mg/dL (ref 0.3–1.2)
Total Protein: 6.7 g/dL (ref 6.5–8.1)

## 2021-04-03 LAB — URINALYSIS, ROUTINE W REFLEX MICROSCOPIC
Bilirubin Urine: NEGATIVE
Glucose, UA: 50 mg/dL — AB
Hgb urine dipstick: NEGATIVE
Ketones, ur: NEGATIVE mg/dL
Leukocytes,Ua: NEGATIVE
Nitrite: NEGATIVE
Protein, ur: NEGATIVE mg/dL
Specific Gravity, Urine: 1.009 (ref 1.005–1.030)
pH: 7 (ref 5.0–8.0)

## 2021-04-03 LAB — PROTIME-INR
INR: 2.9 — ABNORMAL HIGH (ref 0.8–1.2)
Prothrombin Time: 29 seconds — ABNORMAL HIGH (ref 11.4–15.2)

## 2021-04-03 NOTE — ED Provider Notes (Signed)
[facility] EMERGENCY DEPARTMENT Provider Note   CSN: [id number] Arrival date & time: 04/03/21  0251   History Chief complaint: My body shutdown  Chad Valdis Bevill. is a 60 y.o. male.  The history is provided by the patient.  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  He noted that his blood pressure had gone very high-up to 459 systolic.  He had an episode where he states that he lost his vision and noted photophobia.  He states felt like everything shut down but it only for a few seconds and then was back to normal.  He feels he is back to his baseline.  He denies fever or chills.  He denies chest pain, heaviness, tightness, pressure.  He denies dyspnea, nausea, vomiting, diaphoresis.  He states that all of his LVAD parameters have been normal.  He denies any alarms going off and specifically denies any low flow alarm.  Past Medical History:  Diagnosis Date  . AICD (automatic cardioverter/defibrillator) present   . Anemia   . Anginal pain ([place])   . Anxiety   . Aortic dissection, thoracoabdominal ([place])    7/10: Type I s/p repair  . Arthritis   . Asthma   . CAD (coronary artery disease)    a. s/p CABG 2006;  b. DES to PDA 2011 (cath: Dx not seen, dRCA/PDA tx with DES; S-PDA occluded (culprit), S-Dx occluded, S-RI and OM ok, L-LAD ok  . Carotid stenosis    dopplers 2011: 0-39% bilat.  . Chest pain syndrome   . CHF (congestive heart failure) ([place])   . Chronic bronchitis ([place])   . Chronic lower back pain   . Chronic systolic heart failure (HCC)    a. 12/13 ECHO: EF 35-40%, sept, apical & posterobasal HK, LV mod dil & sys fx mod reduced, mild AI, MV mild reg, TV mild reg  . Complication of anesthesia    "difficult to wake afterwards a couple of times" and patient  states he has woken during surgery   . 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 Dyspnea    patient denies at 12/06/2018 appt   . Family history of adverse reaction to anesthesia    "sister hard to wake up"  . GERD (gastroesophageal reflux disease)   . Gout   . Heart murmur   . HLD (hyperlipidemia)   . HTN (hypertension)    severe  . Myocardial infarction [facility])    "many" (11/07/2016)  . PONV (postoperative nausea and vomiting)   . Sleep apnea    does not use mask   . Thyroid cancer ([place])    [employer]    Patient Active Problem List   Diagnosis Date Noted  . Inappropriate behavior 11/04/2020  . Neck pain without injury 08/05/2020  . Left foot pain 08/05/2020  . Heart failure with reduced ejection fraction ([place]) 04/30/2020  . 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 Colicky RUQ abdominal pain 11/30/2018  . Pain in joint of left shoulder 07/05/2018  . 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 Hip region mass, left 06/24/2018  . 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  Inguinal hernia 02/18/2018  . Chronic kidney disease (CKD), stage II (mild) 06/06/2017  . S/P tonsillectomy 06/06/2017  . S/P rotator cuff surgery 06/06/2017  . S/P partial thyroidectomy 06/06/2017  . 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 Failure of implantable cardioverter-defibrillator (ICD) lead 11/07/2016  . Erectile dysfunction 04/18/2016  . Pain in the chest   . Aneurysm of descending thoracic aorta ([place])   . Nasal folliculitis 66/05/3015  . Acute on chronic systolic CHF (congestive heart failure) ([place]) 04/12/2015  . CKD (chronic kidney disease) stage 3, GFR 30-59 ml/min (HCC) 04/12/2015  . Unstable angina ([place]) 01/25/2015  . Chest pain 10/10/2014  . Health care maintenance 03/19/2014  . Local reaction to tetanus vaccine 03/19/2014  .  Neck pain on left side 06/03/2013  . Penile discharge 02/26/2013  . Syncope 01/17/2013  . Chronic combined systolic and diastolic CHF (congestive heart failure) ([place]) 01/16/2013  . Low back pain 04/23/2012  . Chest pain syndrome   . Insomnia 09/14/2011  . 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 Bradycardia 05/03/2011  . Aortic dissection, thoracoabdominal ([place])   . ORTHOSTATIC HYPOTENSION 05/03/2010  . CAROTID BRUIT 01/25/2010  . PANCYTOPENIA 11/01/2009  . Fatigue 10/25/2009  . DISSECTING AORTIC ANEURYSM THORACOABDOMINAL 07/14/2009  . Sciatica of right side 05/18/2009  . Hyperlipidemia 09/29/2008  . Hypertension, benign 09/29/2008  . CAD, ARTERY BYPASS GRAFT 09/29/2008  . SYSTOLIC HEART FAILURE, CHRONIC 09/29/2008  . Implantable cardioverter-defibrillator (ICD) in situ 09/29/2008  . CAD (coronary artery disease) of artery bypass graft 09/29/2008    Past Surgical History:  Procedure Laterality Date  . BACK SURGERY    . CARDIAC CATHETERIZATION     "several" (11/07/2016)  . CARDIAC DEFIBRILLATOR PLACEMENT  11/2005   [employer]; Archie Endo 05/09/2011  . CHOLECYSTECTOMY N/A 12/09/2018   Procedure: LAPAROSCOPIC CHOLECYSTECTOMY;  Surgeon: Ralene Ok, MD;  Location: [facility];  Service: General;  Laterality: N/A;  . CORONARY ANGIOPLASTY WITH STENT PLACEMENT     "I've had 1-2 stents" (11/07/2016)  . CORONARY ARTERY BYPASS GRAFT  06/21/2009   "CABG X2"  . CORONARY ARTERY BYPASS GRAFT  06/29/2005   "CABG X7"  . ICD LEAD REMOVAL  11/07/2016  . ICD LEAD REMOVAL N/A 11/07/2016   Procedure: ICD LEAD REMOVAL, INSERTION OF NEW ICD LEAD;  Surgeon: Evans Lance, MD;  Location: [place];  Service: Cardiovascular;  Laterality: N/A;  Dr. [place] Gum to backup case  . IMPLANTABLE CARDIOVERTER DEFIBRILLATOR (ICD) GENERATOR CHANGE N/A 12/13/2012   Procedure: ICD GENERATOR CHANGE;  Surgeon: Evans Lance, MD;  Location: [facility] CATH LAB;  Service: Cardiovascular;   Laterality: N/A;  . INGUINAL HERNIA REPAIR Bilateral 06/25/2018   Procedure: LAPAROSCOPIC  BILATERAL INGUINAL HERNIA REPAIRS;  Surgeon: Ralene Ok, MD;  Location: [place];  Service: General;  Laterality: Bilateral;  . INSERTION OF MESH Bilateral 06/25/2018   Procedure: INSERTION OF MESH;  Surgeon: Ralene Ok, MD;  Location: [place];  Service: General;  Laterality: Bilateral;  . [place] SURGERY  01/2001    most recent within 5-10 years  . RIGHT HEART CATH N/A 04/05/2018   Procedure: RIGHT HEART CATH;  Surgeon: Jolaine Artist, MD;  Location: [place] CV LAB;  Service: Cardiovascular;  Laterality: N/A;  . RIGHT HEART CATH N/A 01/19/2020   Procedure: RIGHT HEART CATH;  Surgeon: Jolaine Artist, MD;  Location: [place] CV LAB;  Service: Cardiovascular;  Laterality: N/A;  . SHOULDER ARTHROSCOPY WITH ROTATOR CUFF REPAIR Bilateral   . Status post emergency  repair of a type A ascending aortic dissection with a hemiarch reconstruction of the ascending aorta  using a 28-mm Hemashield graft with redo sternotomy and revision of previous bypass grafts in June 2010.    . TESTICLE SURGERY    . THYROIDECTOMY, PARTIAL  06/20/2011  . TONSILLECTOMY    . UMBILICAL HERNIA REPAIR N/A 06/25/2018   Procedure: UMBILICAL HERNIA REPAIR;  Surgeon: Ralene Ok, MD;  Location: [place];  Service: General;  Laterality: N/A;  . VASECTOMY         Family History  Problem Relation Age of Onset  . Hypertension Father   . Heart disease Father   . Early death Father   . COPD Father   . Hypertension Mother   . Coronary artery disease Other     Social History   Tobacco Use  . Smoking status: Never Smoker  . Smokeless tobacco: Never Used  Vaping Use  . Vaping Use: Never used  Substance Use Topics  . Alcohol use: No  . Drug use: No    Home Medications Prior to Admission medications   Medication Sig Start Date End Date Taking? Authorizing Provider  acetaminophen (TYLENOL) 325 MG tablet Take  2 tablets (650 mg total) by mouth every 4 (four) hours as needed for headache or mild pain. 05/03/20   Clegg, Amy D, NP  Alirocumab (PRALUENT) 75 MG/ML SOAJ Inject 75 mg into the skin every 14 (fourteen) days. Patient not taking: Reported on 03/30/2021 09/03/20   Bensimhon, Shaune Pascal, MD  alum & mag hydroxide-simeth (MAALOX/MYLANTA) 200-200-20 MG/5ML suspension Take 15 mLs by mouth every 6 (six) hours as needed for indigestion or heartburn. Patient not taking: Reported on 03/30/2021 05/03/20   Darrick Grinder D, NP  aspirin EC 81 MG tablet Take 81 mg by mouth every evening.    [provider]  carvedilol (COREG) 12.5 MG tablet Take 12.5 mg by mouth 2 (two) times daily. 02/18/21   [provider]  FARXIGA 5 MG TABS tablet Take 5 mg by mouth every morning. 02/15/21   [provider]  fluticasone (FLONASE) 50 MCG/ACT nasal spray Place 2 sprays into both nostrils daily as needed for allergies or rhinitis.     [provider]  gabapentin (NEURONTIN) 300 MG capsule Take 1 capsule (300 mg total) by mouth at bedtime. Patient taking differently: Take 300 mg by mouth 3 (three) times daily. 08/25/20   Bensimhon, Shaune Pascal, MD  hydrALAZINE (APRESOLINE) 25 MG tablet Take 100 mg by mouth 3 (three) times daily. 01/19/21   [provider]  isosorbide mononitrate (IMDUR) 30 MG 24 hr tablet Take 30 mg by mouth daily. 03/06/21   [provider]  methocarbamol (ROBAXIN) 750 MG tablet SMARTSIG:2 Tablet(s) By Mouth Every 12 Hours 02/24/21   [provider]  MITIGARE 0.6 MG CAPS Take 2 capsules by mouth twice daily 03/07/21   Lattie Haw, MD  nitroGLYCERIN (NITROSTAT) 0.4 MG SL tablet Place 0.4 mg under the tongue every 5 (five) minutes as needed for chest pain. Patient not taking: Reported on 03/30/2021    [provider]  Oxycodone HCl 20 MG TABS Take 20 mg by mouth every 6 (six) hours as needed (For pain).  12/22/19   [provider]  potassium chloride  (KLOR-CON) 20 MEQ packet Take 20 mEq by mouth as directed.    [provider]  sacubitril-valsartan (ENTRESTO) 97-103 MG Take 1 tablet by mouth every 12 (twelve) hours. 11/22/20 11/22/21  [provider]  spironolactone (  ALDACTONE) 25 MG tablet Take 1 tablet by mouth daily. 12/15/20 12/15/21  [provider]  torsemide (DEMADEX) 20 MG tablet Take 2 tablets (40 mg total) by mouth daily. 03/30/21   Bensimhon, Shaune Pascal, MD  warfarin (COUMADIN) 2 MG tablet Take by mouth. 7 mg daily per [facility]/Heart Failure Team 12/15/20 12/15/21  [provider]    Allergies    Contrast media [iodinated diagnostic agents], Iohexol, Lipitor [atorvastatin calcium], Shellfish allergy, Sulfa antibiotics, Sulfonamide derivatives, Almond oil, Metrizamide, Zocor [simvastatin], Food, Latex, and Peach [prunus persica]  Review of Systems   Review of Systems  All other systems reviewed and are negative.   Physical Exam Updated Vital Signs BP (!) 109/98   Pulse 67   Temp 98.5 F (36.9 C) (Oral)   Resp 17   Ht 5\' 11"  (1.803 m)   Wt 80.5 kg   SpO2 100%   BMI 24.74 kg/m   Physical Exam Vitals and nursing note reviewed.   60 year old male, resting comfortably and in no acute distress. Vital signs are normal. Oxygen saturation is 100%, which is normal. Head is normocephalic and atraumatic. PERRLA, EOMI. Oropharynx is clear. Neck is nontender and supple without adenopathy or JVD. Back is nontender and there is no CVA tenderness. Lungs are clear without rales, wheezes, or rhonchi. Chest is nontender. Heart: Hum of LVAD present. Abdomen is soft, flat, nontender without masses or hepatosplenomegaly and peristalsis is normoactive. Extremities have no cyanosis or edema, full range of motion is present. Skin is warm and dry without rash. Neurologic: Mental status is normal, cranial nerves are intact, there are no motor or sensory deficits.  ED Results / Procedures / Treatments    Labs (all labs ordered are listed, but only abnormal results are displayed) Labs Reviewed  COMPREHENSIVE METABOLIC PANEL - Abnormal; Notable for the following components:      Result Value   Glucose, Bld 105 (*)    Creatinine, Ser 1.34 (*)    Calcium 8.5 (*)    Albumin 3.4 (*)    All other components within normal limits  CBC WITH DIFFERENTIAL/PLATELET - Abnormal; Notable for the following components:   WBC 3.5 (*)    Hemoglobin 10.7 (*)    HCT 34.4 (*)    MCH 24.9 (*)    RDW 17.7 (*)    Platelets 109 (*)    All other components within normal limits  URINALYSIS, ROUTINE W REFLEX MICROSCOPIC - Abnormal; Notable for the following components:   Glucose, UA 50 (*)    All other components within normal limits  PROTIME-INR - Abnormal; Notable for the following components:   Prothrombin Time 29.0 (*)    INR 2.9 (*)    All other components within normal limits    EKG EKG Interpretation  Date/Time:  Sunday April 03 2021 03:23:36 EDT Ventricular Rate:  69 PR Interval:  242 QRS Duration: 117 QT Interval:  441 QTC Calculation: 473 R Axis:   -77 Text Interpretation: Sinus rhythm Atrial premature complex Prolonged PR interval Left atrial enlargement Left anterior fascicular block Probable anterolateral infarct, old Artifact in lead(s) I II III aVR aVL aVF V1 V2 V3 V5 V6 When compared with ECG of 05/01/2020, Premature ventricular complexes are no longer present Confirmed by Delora Fuel ([id number]) on 04/03/2021 3:43:37 AM   Radiology DG Chest Port 1 View  Result Date: 04/03/2021 CLINICAL DATA:  Weakness, whole body EXAM: PORTABLE CHEST 1 VIEW COMPARISON:  05/01/2020 FINDINGS: Aneurysmal aorta with stent grafting. There  has also been CABG and aortic valve replacement. Left ventricular assist device since prior. Single chamber defibrillator lead into the right ventricle. Mild blunting at left costophrenic sulcus may be from scarring. No edema or focal pneumonia. Negative for pneumothorax.  IMPRESSION: No acute finding when compared with priors. Electronically Signed   By: Monte Fantasia M.D.   On: 04/03/2021 04:42    Procedures Procedures   Medications Ordered in ED Medications - No data to display  ED Course  I have reviewed the triage vital signs and the nursing notes.  Pertinent labs & imaging results that were available during my care of the patient were reviewed by me and considered in my medical decision making (see chart for details).  MDM Rules/Calculators/A&P Transient episode of malaise of uncertain cause.  LVAD appears to be functioning normally.  ECG is unchanged from prior.  Will check screening labs.  Old records are reviewed confirming chronic management of heart failure and LVAD.  Work-up shows lab values essentially at baseline.  Renal insufficiency is present as well as mild anemia, thrombocytopenia, leukopenia.  None of these are significantly different from his most recent values.  Urinalysis is normal and chest x-ray shows no acute findings.  INR is therapeutic.  Case has been discussed with Dr. Nani Ravens of cardiology service who is familiar with the patient.  He feels that patient is safe for discharge and he will see him in clinic tomorrow.  Final Clinical Impression(s) / ED Diagnoses Final diagnoses:  Weakness  Renal insufficiency  Normocytic anemia  Thrombocytopenia (HCC)  Presence of left ventricular assist device [facility])    Rx / DC Orders ED Discharge Orders    None       Delora Fuel, MD 01/00/71 [phone]

## 2021-04-03 NOTE — ED Triage Notes (Addendum)
Pt bib gems c/o of full body weakness and light sensitivity - one hour PTA. Denies any syncopal episodes but pt states he felt like he was going to pass out. Pt is LVAD (heartmate 3) pt since June 2021 with [facility]. Non febrile. No neuro deficits. Pt reports recent increase in diuretics. Denies any LVAD alarms.   100% RA  BP: 158/109  CBG: 118

## 2021-04-03 NOTE — Discharge Instructions (Addendum)
Your evaluation here did not show any serious problems.  However, if you have any difficulty with your ventricular assist device, return to the emergency department.  Otherwise, Dr. Haroldine Laws wants to see you in the ventricular assist device clinic tomorrow.

## 2021-04-04 ENCOUNTER — Other Ambulatory Visit: Payer: Self-pay

## 2021-04-04 ENCOUNTER — Ambulatory Visit (HOSPITAL_COMMUNITY)
Admission: RE | Admit: 2021-04-04 | Discharge: 2021-04-04 | Disposition: A | Discharge status: Home / Self Care | Payer: Medicaid Other | Source: Ambulatory Visit | Attending: Internal Medicine | Admitting: Internal Medicine

## 2021-04-04 VITALS — BP 112/0 | HR 67 | Ht 71.0 in | Wt 171.8 lb

## 2021-04-04 DIAGNOSIS — I5023 Acute on chronic systolic (congestive) heart failure: Secondary | ICD-10-CM | POA: Diagnosis not present

## 2021-04-04 DIAGNOSIS — R002 Palpitations: Secondary | ICD-10-CM | POA: Diagnosis not present

## 2021-04-04 DIAGNOSIS — I5022 Chronic systolic (congestive) heart failure: Secondary | ICD-10-CM | POA: Diagnosis not present

## 2021-04-04 DIAGNOSIS — Z452 Encounter for adjustment and management of vascular access device: Secondary | ICD-10-CM | POA: Diagnosis not present

## 2021-04-04 DIAGNOSIS — H5789 Other specified disorders of eye and adnexa: Secondary | ICD-10-CM | POA: Diagnosis not present

## 2021-04-04 DIAGNOSIS — Z95811 Presence of heart assist device: Secondary | ICD-10-CM | POA: Diagnosis not present

## 2021-04-04 MED ORDER — TORSEMIDE 20 MG PO TABS: 20.0000 mg | ORAL_TABLET | ORAL | 4 refills | Status: DC

## 2021-04-04 NOTE — Patient Instructions (Signed)
1. Decrease Torsemide to 20 mg (1 tablet) on Monday, Wednesday and Friday 2. Please see an Eye Doctor TODAY for significant eye redness, pain and swelling per Dr. Haroldine Laws

## 2021-04-04 NOTE — Progress Notes (Signed)
Patient presents for initial sick visit in [facility] today alone. His niece [name] (caregiver) is participating over the phone. Reports no problems with VAD equipment or concerns with drive line.   Pt presented to the ED over the weekend stating that "my body shut down." pt states that he lost his vision during this episode that gradually his body "came back to life."  Pt aware of echo appointment date and time and verbalized understanding for Wednesday.  pts only compliant today is that his eyes hurt and burn-pt belies this is a side effect from one of his medications but he states that he isn't sure which one. Dr. Haroldine Laws assessed his eyes today in clinic, both eyes are very red and painful. Dr. Haroldine Laws instructed pt to go the eye doctor ASAP.  Cardiac rehab referral placed last week by Zada Girt RN VAD coordinator. Cardiac rehab will reach out to him regarding benefit eligibility and scheduling process per Maurice Small RN with cardiac rehab note on 03/21/21.   Vital Signs:  Doppler Pressure: 112 Automatc BP: 129/73 (105) HR: 67 SR SPO2: 98 %   Weight: 171.8 lb w/o eqt Last weight:176.8 lb Home weights: 177.4 lbs    VAD Indication: Destination Therapy - HM III implanted at [facility] 06/04/20   VAD interrogation: Speed: 5800 Flow: 4.3 Power: 4.6w    PI: 4.2 Alarms: none Events: 5-10 PI events daily  Fixed speed: 5800 Low speed limit: 5500  Primary controller: back up battery due for replacement in 22 months Secondary controller:  back up battery due for replacement in  months-- did not bring today   I reviewed the LVAD parameters from today and compared the results to the patient's prior recorded data. LVAD interrogation was NEGATIVE for significant power changes, NEGATIVE for clinical alarms and STABLE for PI events/speed drops. No programming changes were made and pump is functioning within specified parameters. Pt is performing daily controller and system monitor self  tests along with completing weekly and monthly maintenance for LVAD equipment.   LVAD equipment check completed and is in good working order. Back-up equipment not present.   Annual Equipment Maintenance on UBC/PM maintained by [facility].   Exit Site Care: VAD dressing maintained weekly by patient. Dressing clean, dry, intact. Anchor in place and correctly applied. Exit site well healed and incorporated.The velour is fully implanted at exit site. No redness, tenderness, drainage, or foul odor noted. Pt denies fever or chills. Pt obtains dressing supplies from [facility]. States he has adequate dressing supplies at home.    Device: [employer] single Therapies: VF 250 VT 210         VT 185 (monitor)  Last check: 03/07/21   BP & Labs:  MAP 112 - Doppler is reflecting MAP   Hgb 10.7 - No S/S of bleeding. Specifically denies melena/BRBPR or nosebleeds.   LDH stable at 202 with established baseline of 150-250. Denies tea-colored urine. No power elevations noted on interrogation.     Patient Instructions: 1. Decrease Torsemide to 20 mg (1 tablet) on Monday, Wednesday and Friday 2. Please see an Eye Doctor TODAY for significant eye redness, pain and swelling per Dr. Haroldine Laws 3. Please attend echo appt and IV Fereheme on Wednesday 4. Return to clinic in 1 month  [place] Coordinator  Office: [phone]  24/7 Pager: [phone]

## 2021-04-05 DIAGNOSIS — F064 Anxiety disorder due to known physiological condition: Secondary | ICD-10-CM | POA: Diagnosis not present

## 2021-04-05 DIAGNOSIS — Z95810 Presence of automatic (implantable) cardiac defibrillator: Secondary | ICD-10-CM | POA: Diagnosis not present

## 2021-04-05 DIAGNOSIS — I255 Ischemic cardiomyopathy: Secondary | ICD-10-CM | POA: Diagnosis not present

## 2021-04-05 DIAGNOSIS — M109 Gout, unspecified: Secondary | ICD-10-CM | POA: Diagnosis not present

## 2021-04-05 DIAGNOSIS — Z79891 Long term (current) use of opiate analgesic: Secondary | ICD-10-CM | POA: Diagnosis not present

## 2021-04-05 DIAGNOSIS — I340 Nonrheumatic mitral (valve) insufficiency: Secondary | ICD-10-CM | POA: Diagnosis not present

## 2021-04-05 DIAGNOSIS — Z951 Presence of aortocoronary bypass graft: Secondary | ICD-10-CM | POA: Diagnosis not present

## 2021-04-05 DIAGNOSIS — N182 Chronic kidney disease, stage 2 (mild): Secondary | ICD-10-CM | POA: Diagnosis not present

## 2021-04-05 DIAGNOSIS — Z85850 Personal history of malignant neoplasm of thyroid: Secondary | ICD-10-CM | POA: Diagnosis not present

## 2021-04-05 DIAGNOSIS — E785 Hyperlipidemia, unspecified: Secondary | ICD-10-CM | POA: Diagnosis not present

## 2021-04-05 DIAGNOSIS — M5431 Sciatica, right side: Secondary | ICD-10-CM | POA: Diagnosis not present

## 2021-04-05 DIAGNOSIS — Z9181 History of falling: Secondary | ICD-10-CM | POA: Diagnosis not present

## 2021-04-05 DIAGNOSIS — I130 Hypertensive heart and chronic kidney disease with heart failure and stage 1 through stage 4 chronic kidney disease, or unspecified chronic kidney disease: Secondary | ICD-10-CM | POA: Diagnosis not present

## 2021-04-05 DIAGNOSIS — D631 Anemia in chronic kidney disease: Secondary | ICD-10-CM | POA: Diagnosis not present

## 2021-04-05 DIAGNOSIS — I5023 Acute on chronic systolic (congestive) heart failure: Secondary | ICD-10-CM | POA: Diagnosis not present

## 2021-04-05 DIAGNOSIS — I252 Old myocardial infarction: Secondary | ICD-10-CM | POA: Diagnosis not present

## 2021-04-05 DIAGNOSIS — Z7982 Long term (current) use of aspirin: Secondary | ICD-10-CM | POA: Diagnosis not present

## 2021-04-05 DIAGNOSIS — K219 Gastro-esophageal reflux disease without esophagitis: Secondary | ICD-10-CM | POA: Diagnosis not present

## 2021-04-05 DIAGNOSIS — Z5181 Encounter for therapeutic drug level monitoring: Secondary | ICD-10-CM | POA: Diagnosis not present

## 2021-04-05 DIAGNOSIS — G8929 Other chronic pain: Secondary | ICD-10-CM | POA: Diagnosis not present

## 2021-04-05 DIAGNOSIS — Z7901 Long term (current) use of anticoagulants: Secondary | ICD-10-CM | POA: Diagnosis not present

## 2021-04-05 DIAGNOSIS — Z952 Presence of prosthetic heart valve: Secondary | ICD-10-CM | POA: Diagnosis not present

## 2021-04-05 DIAGNOSIS — J45909 Unspecified asthma, uncomplicated: Secondary | ICD-10-CM | POA: Diagnosis not present

## 2021-04-05 DIAGNOSIS — I5022 Chronic systolic (congestive) heart failure: Secondary | ICD-10-CM | POA: Diagnosis not present

## 2021-04-05 DIAGNOSIS — I2510 Atherosclerotic heart disease of native coronary artery without angina pectoris: Secondary | ICD-10-CM | POA: Diagnosis not present

## 2021-04-05 NOTE — Discharge Instructions (Signed)
Ferumoxytol injection What is this medicine? FERUMOXYTOL is an iron complex. Iron is used to make healthy red blood cells, which carry oxygen and nutrients throughout the body. This medicine is used to treat iron deficiency anemia. This medicine may be used for other purposes; ask your health care provider or pharmacist if you have questions. COMMON BRAND NAME(S): Feraheme What should I tell my health care provider before I take this medicine? They need to know if you have any of these conditions:  anemia not caused by low iron levels  high levels of iron in the blood  magnetic resonance imaging (MRI) test scheduled  an unusual or allergic reaction to iron, other medicines, foods, dyes, or preservatives  pregnant or trying to get pregnant  breast-feeding How should I use this medicine? This medicine is for injection into a vein. It is given by a health care professional in a hospital or clinic setting. Talk to your pediatrician regarding the use of this medicine in children. Special care may be needed. Overdosage: If you think you have taken too much of this medicine contact a poison control center or emergency room at once. NOTE: This medicine is only for you. Do not share this medicine with others. What if I miss a dose? It is important not to miss your dose. Call your doctor or health care professional if you are unable to keep an appointment. What may interact with this medicine? This medicine may interact with the following medications:  other iron products This list may not describe all possible interactions. Give your health care provider a list of all the medicines, herbs, non-prescription drugs, or dietary supplements you use. Also tell them if you smoke, drink alcohol, or use illegal drugs. Some items may interact with your medicine. What should I watch for while using this medicine? Visit your doctor or healthcare professional regularly. Tell your doctor or healthcare  professional if your symptoms do not start to get better or if they get worse. You may need blood work done while you are taking this medicine. You may need to follow a special diet. Talk to your doctor. Foods that contain iron include: whole grains/cereals, dried fruits, beans, or peas, leafy green vegetables, and organ meats (liver, kidney). What side effects may I notice from receiving this medicine? Side effects that you should report to your doctor or health care professional as soon as possible:  allergic reactions like skin rash, itching or hives, swelling of the face, lips, or tongue  breathing problems  changes in blood pressure  feeling faint or lightheaded, falls  fever or chills  flushing, sweating, or hot feelings  swelling of the ankles or feet Side effects that usually do not require medical attention (report to your doctor or health care professional if they continue or are bothersome):  diarrhea  headache  nausea, vomiting  stomach pain This list may not describe all possible side effects. Call your doctor for medical advice about side effects. You may report side effects to FDA at 1-800-FDA-1088. Where should I keep my medicine? This drug is given in a hospital or clinic and will not be stored at home. NOTE: This sheet is a summary. It may not cover all possible information. If you have questions about this medicine, talk to your doctor, pharmacist, or health care provider.  2021 Elsevier/Gold Standard (2017-01-29 20:21:10)  

## 2021-04-06 ENCOUNTER — Other Ambulatory Visit (HOSPITAL_COMMUNITY): Payer: Self-pay | Admitting: Internal Medicine

## 2021-04-06 ENCOUNTER — Encounter (HOSPITAL_COMMUNITY): Payer: Medicaid Other

## 2021-04-06 ENCOUNTER — Ambulatory Visit (HOSPITAL_COMMUNITY): Admission: RE | Admit: 2021-04-06 | Payer: Medicaid Other | Source: Ambulatory Visit

## 2021-04-06 ENCOUNTER — Encounter (HOSPITAL_COMMUNITY)
Admission: RE | Admit: 2021-04-06 | Discharge: 2021-04-06 | Disposition: A | Discharge status: Home / Self Care | Payer: Medicaid Other | Source: Ambulatory Visit | Attending: Internal Medicine | Admitting: Internal Medicine

## 2021-04-06 DIAGNOSIS — D509 Iron deficiency anemia, unspecified: Secondary | ICD-10-CM | POA: Insufficient documentation

## 2021-04-06 DIAGNOSIS — Z95811 Presence of heart assist device: Secondary | ICD-10-CM | POA: Insufficient documentation

## 2021-04-06 MED ORDER — FERUMOXYTOL INJECTION 510 MG/17 ML
510.0000 mg | INTRAVENOUS | Status: DC
  Administered 2021-04-06: 510 mg via INTRAVENOUS
  Filled 2021-04-06: qty 510

## 2021-04-07 NOTE — Addendum Note (Signed)
Encounter addended by: Jolaine Artist, MD on: 04/07/2021 12:12 AM  Actions taken: Clinical Note Signed, Level of Service modified, Visit diagnoses modified, Charge Capture section accepted

## 2021-04-07 NOTE — Progress Notes (Signed)
Advanced Heart Failure Clinic Note   Patient ID: Chad Bark., male   DOB: [birth year], 60 y.o.   MRN: [id number]   Primary Cardiologist:  Dr. Glori Bickers   History of Present Illness:  Chad [name] is a 60 y.o. male with aevere HTN, CAD s/p MI with CABG 2006 and DES to native PDA in 5176, systolic HF EF 16-07% s/p BOsSCI ICD. 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  Underwent high risk HM-III VAD placement at [facility] 6/21.    Echo 3/22 EF 20% RV moderate HK.  RHC with exercise 3/22 on 5600  Rest RA 9 PCWP 12 CI 2.4 MAP 100 Exercise PCWP 27 CI 2.5 pVO2 7.4 slope 42  Seen last week with significant volume overload. Torsemide increased to 40 daily. Presented to ED over the weekend saying his "body shut down" and hat he lost his vision during this episode but his body gradually "came back to life.". Work-up unrevealing and discharged home. Asked to f/u here. Today he says overall he feels better but eyes are very red and painful and he is worried that he might lose his vision. Currently acuity ok. Denies trauma or using any drops, etc. He thinks it may be related to one of his meds but no new meds started. Edema much improved. Denies orthopnea or PND. No fevers, chills or problems with driveline. No bleeding, melena or neuro symptoms. No VAD alarms. Taking all meds as prescribed.    VAD Indication: Destination Therapy- HM III implanted at [facility] 06/04/20  VAD interrogation: Speed: 5800 Flow: 4.3 Power: 4.6w PI: 4.2 Alarms: none Events: 5-10 PI events daily  Fixed speed: 5800 Low speed limit: 5500  Primary controller: back up battery due for replacement in 22 months Secondary controller: back up battery due for replacement in  months-- did not bring  today     Review of systems complete and found to be negative unless listed in HPI.   Past Medical History:  Diagnosis Date  . AICD (automatic cardioverter/defibrillator) present   . Anemia   . Anginal pain ([place])   . Anxiety   . Aortic dissection, thoracoabdominal ([place])    7/10: Type I s/p repair  . Arthritis   . Asthma   . CAD (coronary artery disease)    a. s/p CABG 2006;  b. DES to PDA 2011 (cath: Dx not seen, dRCA/PDA tx with DES; S-PDA occluded (culprit), S-Dx occluded, S-RI and OM ok, L-LAD ok  . Carotid stenosis    dopplers 2011: 0-39% bilat.  . Chest pain syndrome   . CHF (congestive heart failure) ([place])   . Chronic bronchitis ([place])   . Chronic lower back pain   . Chronic systolic heart failure (HCC)    a. 12/13 ECHO: EF 35-40%, sept, apical & posterobasal HK, LV mod dil & sys fx mod reduced, mild AI, MV mild reg, TV mild reg  . Complication of anesthesia    "difficult to wake afterwards a couple of times" and patient states he has woken during surgery   . 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 Dyspnea    patient denies at 12/06/2018 appt   . Family history of adverse reaction to anesthesia    "  sister hard to wake up"  . GERD (gastroesophageal reflux disease)   . Gout   . Heart murmur   . HLD (hyperlipidemia)   . HTN (hypertension)    severe  . Myocardial infarction [facility])    "many" (11/07/2016)  . PONV (postoperative nausea and vomiting)   . Sleep apnea    does not use mask   . Thyroid cancer ([place])    Hertle Cell    Current Outpatient Medications  Medication Sig Dispense Refill  . acetaminophen (TYLENOL) 325 MG tablet Take 2 tablets (650 mg total) by mouth every 4 (four) hours as needed for headache or mild pain.    [name] aspirin EC 81 MG tablet Take 81 mg by mouth every evening.    . carvedilol (COREG) 12.5 MG tablet Take 12.5 mg by mouth 2 (two) times daily.    [name] FARXIGA 5 MG TABS tablet Take 5  mg by mouth every morning.    . fluticasone (FLONASE) 50 MCG/ACT nasal spray Place 2 sprays into both nostrils daily as needed for allergies or rhinitis.     [name] gabapentin (NEURONTIN) 300 MG capsule Take 1 capsule (300 mg total) by mouth at bedtime. (Patient taking differently: Take 300 mg by mouth 3 (three) times daily.) 30 capsule 3  . hydrALAZINE (APRESOLINE) 25 MG tablet Take 100 mg by mouth 3 (three) times daily.    . isosorbide mononitrate (IMDUR) 30 MG 24 hr tablet Take 30 mg by mouth daily.    [name] MITIGARE 0.6 MG CAPS Take 2 capsules by mouth twice daily (Patient taking differently: as needed.) 120 capsule 0  . Oxycodone HCl 20 MG TABS Take 20 mg by mouth every 6 (six) hours as needed (For pain).     . potassium chloride (KLOR-CON) 20 MEQ packet Take 20 mEq by mouth as directed.    . sacubitril-valsartan (ENTRESTO) 97-103 MG Take 1 tablet by mouth every 12 (twelve) hours.    [name] spironolactone (ALDACTONE) 25 MG tablet Take 1 tablet by mouth daily.    . Alirocumab (PRALUENT) 75 MG/ML SOAJ Inject 75 mg into the skin every 14 (fourteen) days. (Patient not taking: No sig reported) 2 mL 11  . alum & mag hydroxide-simeth (MAALOX/MYLANTA) 200-200-20 MG/5ML suspension Take 15 mLs by mouth every 6 (six) hours as needed for indigestion or heartburn. (Patient not taking: No sig reported) 355 mL 0  . methocarbamol (ROBAXIN) 750 MG tablet SMARTSIG:2 Tablet(s) By Mouth Every 12 Hours (Patient not taking: Reported on 04/04/2021)    . nitroGLYCERIN (NITROSTAT) 0.4 MG SL tablet Place 0.4 mg under the tongue every 5 (five) minutes as needed for chest pain. (Patient not taking: No sig reported)    . torsemide (DEMADEX) 20 MG tablet Take 1 tablet (20 mg total) by mouth every Monday, Wednesday, and Friday. 60 tablet 4  . warfarin (COUMADIN) 2 MG tablet Take by mouth. 7 mg daily per [facility]/Heart Failure Team     No current facility-administered medications for this encounter.   Facility-Administered  Medications Ordered in Other Encounters  Medication Dose Route Frequency Provider Last Rate Last Admin  . ferumoxytol (FERAHEME) 510 mg in sodium chloride 0.9 % 100 mL IVPB  510 mg Intravenous Weekly Tiawana Forgy, Shaune Pascal, MD   Stopped at 04/06/21 1138    Allergies  Allergen Reactions  . Contrast Media [Iodinated Diagnostic Agents] Anaphylaxis  . Iohexol Anaphylaxis and Other (See Comments)    PT HAS ANAPHYLAXIS WITH CONTRAST MEDIA!  . Lipitor [Atorvastatin Calcium] Anaphylaxis  and Other (See Comments)    Large doses  . Shellfish Allergy Anaphylaxis  . Sulfa Antibiotics Shortness Of Breath and Swelling  . Sulfonamide Derivatives Shortness Of Breath and Swelling  . Almond Oil Itching    FACIAL/MOUTH ITCHING  . Metrizamide Swelling    SWELLING REACTION UNSPECIFIED   . Zocor [Simvastatin] Other (See Comments)    Muscle cramps  . Food Itching    Shellfish, peaches, almonds, apples & kiwis  . Latex Rash and Other (See Comments)    With long periods of exposure  . Peach [Prunus Persica] Itching    Vital Signs: Vitals:   04/04/21 1220 04/04/21 1222  BP: 129/73 (!) 112/0  Pulse: 67   SpO2: 98%   Weight: 77.9 kg (171 lb 12.8 oz)   Height: 5\' 11"  (1.803 m)    Filed Weights   04/04/21 1220  Weight: 77.9 kg (171 lb 12.8 oz)    Wt Readings from Last 3 Encounters:  04/04/21 77.9 kg (171 lb 12.8 oz)  04/03/21 80.5 kg (177 lb 6.4 oz)  03/30/21 80.2 kg (176 lb 12.8 oz)    Vital Signs:  Doppler Pressure: 112 Automatc BP: 129/73 (105) HR: 67 SR SPO2: 98 %  Weight: 171.8 lb w/o eqt Last weight:176.8 lb Home weights: 177.4 lbs  Exam: General:  NAD. Light sensitive HEENT: normal x for severe conjunctival injection L>R Neck: supple. JVP not elevated.  Carotids 2+ bilat; no bruits. No lymphadenopathy or thryomegaly appreciated. Cor: LVAD hum.  Lungs: Clear. Abdomen: obese soft, nontender, non-distended. No hepatosplenomegaly. No bruits or masses. Good bowel sounds. Driveline  site clean. Anchor in place.  Extremities: no cyanosis, clubbing, rash. Warm no edema  Neuro: alert & oriented x 3. No focal deficits. Moves all 4 without problem   ASSESSMENT/PLAN:  1. Conjunctival irritation and photosensitivity - visual acuity appears intact. Denies trauma - no evidence iritis - have asked him to go see his eye doctor today to further evalaute  2. Chronic systolic HF: Ischemic cardiomyopathy.  Echo (2/16) with EF 25%. S/p [employer] ICD. Echo 3/18. EF 20-25%. Echo 04/18/18: EF 20-25%, grade 2 DD. - s/p HM-III VAD at [facility] 6/21 - Echo and RHC at [facility] 3/22 (details above) - Stable NYHA III with severely depressed Vo2 - Was volume overloaded last week now improved with increasing torsemide to 40 daily for several days  - Will switch to torsemide 20 MWF (was previously not taking regularly). Take extra as needed - Continue Entresto 97/103 bid - Continue spiro 25 daily - Continue Farxiga 5 daily - Continue carvedilol 12.5 bid  - Continue hydral 100 tid and Imdur 30 daily - Now listed for transplant at [facility]  3. CAD s/p CABG 2006:  - stable - no s/s angina  4. VAD management - VAD interrogated personally. Parameters stable. - DL site ok - INR managed by [facility]. No change  5. CKD 3a in setting of solitary kidney s/p dissection - Continue ARNI and Farxiga - creatinine stable at 1.37 over the weekend  - follow with diuresis  6. HTN:  - Remains elevated but improved. - Can add amlodipine as needed  6. Palpitations - ICD interrogated personally in clinic. No VT - Wearing Zio to look for AF  7. Iron-deficiency anemia - will schedule Feraheme infusion x 3. D/w Pharm D  Total time spent 45 minutes. Over half that time spent discussing above.    Glori Bickers, MD 12:02 AM

## 2021-04-12 ENCOUNTER — Encounter (HOSPITAL_COMMUNITY): Payer: Medicaid Other

## 2021-04-12 DIAGNOSIS — M5431 Sciatica, right side: Secondary | ICD-10-CM | POA: Diagnosis not present

## 2021-04-12 DIAGNOSIS — Z7901 Long term (current) use of anticoagulants: Secondary | ICD-10-CM | POA: Diagnosis not present

## 2021-04-12 DIAGNOSIS — Z9181 History of falling: Secondary | ICD-10-CM | POA: Diagnosis not present

## 2021-04-12 DIAGNOSIS — K219 Gastro-esophageal reflux disease without esophagitis: Secondary | ICD-10-CM | POA: Diagnosis not present

## 2021-04-12 DIAGNOSIS — E785 Hyperlipidemia, unspecified: Secondary | ICD-10-CM | POA: Diagnosis not present

## 2021-04-12 DIAGNOSIS — I252 Old myocardial infarction: Secondary | ICD-10-CM | POA: Diagnosis not present

## 2021-04-12 DIAGNOSIS — N182 Chronic kidney disease, stage 2 (mild): Secondary | ICD-10-CM | POA: Diagnosis not present

## 2021-04-12 DIAGNOSIS — I130 Hypertensive heart and chronic kidney disease with heart failure and stage 1 through stage 4 chronic kidney disease, or unspecified chronic kidney disease: Secondary | ICD-10-CM | POA: Diagnosis not present

## 2021-04-12 DIAGNOSIS — M109 Gout, unspecified: Secondary | ICD-10-CM | POA: Diagnosis not present

## 2021-04-12 DIAGNOSIS — Z85850 Personal history of malignant neoplasm of thyroid: Secondary | ICD-10-CM | POA: Diagnosis not present

## 2021-04-12 DIAGNOSIS — Z7982 Long term (current) use of aspirin: Secondary | ICD-10-CM | POA: Diagnosis not present

## 2021-04-12 DIAGNOSIS — G8929 Other chronic pain: Secondary | ICD-10-CM | POA: Diagnosis not present

## 2021-04-12 DIAGNOSIS — I340 Nonrheumatic mitral (valve) insufficiency: Secondary | ICD-10-CM | POA: Diagnosis not present

## 2021-04-12 DIAGNOSIS — Z951 Presence of aortocoronary bypass graft: Secondary | ICD-10-CM | POA: Diagnosis not present

## 2021-04-12 DIAGNOSIS — D631 Anemia in chronic kidney disease: Secondary | ICD-10-CM | POA: Diagnosis not present

## 2021-04-12 DIAGNOSIS — Z952 Presence of prosthetic heart valve: Secondary | ICD-10-CM | POA: Diagnosis not present

## 2021-04-12 DIAGNOSIS — J45909 Unspecified asthma, uncomplicated: Secondary | ICD-10-CM | POA: Diagnosis not present

## 2021-04-12 DIAGNOSIS — I2510 Atherosclerotic heart disease of native coronary artery without angina pectoris: Secondary | ICD-10-CM | POA: Diagnosis not present

## 2021-04-12 DIAGNOSIS — Z95810 Presence of automatic (implantable) cardiac defibrillator: Secondary | ICD-10-CM | POA: Diagnosis not present

## 2021-04-12 DIAGNOSIS — I5022 Chronic systolic (congestive) heart failure: Secondary | ICD-10-CM | POA: Diagnosis not present

## 2021-04-12 DIAGNOSIS — Z5181 Encounter for therapeutic drug level monitoring: Secondary | ICD-10-CM | POA: Diagnosis not present

## 2021-04-12 DIAGNOSIS — I255 Ischemic cardiomyopathy: Secondary | ICD-10-CM | POA: Diagnosis not present

## 2021-04-12 DIAGNOSIS — F064 Anxiety disorder due to known physiological condition: Secondary | ICD-10-CM | POA: Diagnosis not present

## 2021-04-12 DIAGNOSIS — Z79891 Long term (current) use of opiate analgesic: Secondary | ICD-10-CM | POA: Diagnosis not present

## 2021-04-13 ENCOUNTER — Encounter (HOSPITAL_COMMUNITY)
Admission: RE | Admit: 2021-04-13 | Discharge: 2021-04-13 | Disposition: A | Discharge status: Home / Self Care | Payer: Medicaid Other | Source: Ambulatory Visit | Attending: Internal Medicine | Admitting: Internal Medicine

## 2021-04-13 ENCOUNTER — Other Ambulatory Visit: Payer: Self-pay

## 2021-04-13 DIAGNOSIS — Z95811 Presence of heart assist device: Secondary | ICD-10-CM | POA: Diagnosis not present

## 2021-04-13 DIAGNOSIS — Z7901 Long term (current) use of anticoagulants: Secondary | ICD-10-CM | POA: Diagnosis not present

## 2021-04-13 DIAGNOSIS — I5032 Chronic diastolic (congestive) heart failure: Secondary | ICD-10-CM | POA: Diagnosis not present

## 2021-04-13 DIAGNOSIS — D509 Iron deficiency anemia, unspecified: Secondary | ICD-10-CM

## 2021-04-13 MED ORDER — FERUMOXYTOL INJECTION 510 MG/17 ML
510.0000 mg | INTRAVENOUS | Status: DC
  Administered 2021-04-13: 510 mg via INTRAVENOUS
  Filled 2021-04-13: qty 510

## 2021-04-14 ENCOUNTER — Other Ambulatory Visit (HOSPITAL_COMMUNITY): Payer: Medicaid Other

## 2021-04-14 ENCOUNTER — Encounter (HOSPITAL_COMMUNITY): Payer: Medicaid Other

## 2021-04-18 DIAGNOSIS — M25462 Effusion, left knee: Secondary | ICD-10-CM | POA: Diagnosis not present

## 2021-04-18 DIAGNOSIS — M7989 Other specified soft tissue disorders: Secondary | ICD-10-CM | POA: Diagnosis not present

## 2021-04-18 DIAGNOSIS — M25062 Hemarthrosis, left knee: Secondary | ICD-10-CM | POA: Diagnosis not present

## 2021-04-18 DIAGNOSIS — M25562 Pain in left knee: Secondary | ICD-10-CM | POA: Diagnosis not present

## 2021-04-21 ENCOUNTER — Telehealth (HOSPITAL_COMMUNITY): Payer: Self-pay | Admitting: *Deleted

## 2021-04-21 ENCOUNTER — Encounter (HOSPITAL_COMMUNITY)
Admission: RE | Admit: 2021-04-21 | Discharge: 2021-04-21 | Disposition: A | Discharge status: Home / Self Care | Payer: Medicaid Other | Source: Ambulatory Visit | Attending: Internal Medicine | Admitting: Internal Medicine

## 2021-04-21 DIAGNOSIS — D509 Iron deficiency anemia, unspecified: Secondary | ICD-10-CM

## 2021-04-21 DIAGNOSIS — I5022 Chronic systolic (congestive) heart failure: Secondary | ICD-10-CM | POA: Diagnosis not present

## 2021-04-21 DIAGNOSIS — Z7901 Long term (current) use of anticoagulants: Secondary | ICD-10-CM | POA: Diagnosis not present

## 2021-04-21 DIAGNOSIS — Z95811 Presence of heart assist device: Secondary | ICD-10-CM | POA: Diagnosis not present

## 2021-04-21 MED ORDER — SODIUM CHLORIDE 0.9 % IV SOLN
510.0000 mg | INTRAVENOUS | Status: AC
  Administered 2021-04-21: 510 mg via INTRAVENOUS
  Filled 2021-04-21: qty 510

## 2021-04-21 NOTE — Telephone Encounter (Signed)
Called patient following patient contacting [facility] (CH[facility]) clinic with complaints of low BP.  Patient reports while getting IV iron this am, his BP was running low, and has been running low for last week. Says he had to stop several times on way home to "sleep" due to fatigue. He is requesting to cut back on Coreg to 6.25 bid. He reports was higher in the past and his Coreg was increased to 12.5 bid. He says he now is "doing better" with his diet and feels the Coreg is "too much". He says his body needs higher BP than most VAD patients, otherwise he feels "awful".   Patient will take Coreg 6.25 bid and I will update Dr. Haroldine Laws. I will call patient back if Dr. Haroldine Laws disagrees with plan or has other instructions. Pt verbalized understanding of same.  Zada Girt RN, [place] Coordinator [phone]

## 2021-04-25 ENCOUNTER — Encounter (HOSPITAL_COMMUNITY): Payer: Self-pay

## 2021-04-25 ENCOUNTER — Telehealth (HOSPITAL_COMMUNITY): Payer: Self-pay

## 2021-04-25 NOTE — Telephone Encounter (Signed)
Called pt in regards to cardiac rehab, unable to leave a VM due to pt VM box being full. Mailed letter

## 2021-04-26 ENCOUNTER — Ambulatory Visit (HOSPITAL_COMMUNITY): Payer: Medicaid Other

## 2021-04-27 DIAGNOSIS — Z9181 History of falling: Secondary | ICD-10-CM | POA: Diagnosis not present

## 2021-04-27 DIAGNOSIS — E785 Hyperlipidemia, unspecified: Secondary | ICD-10-CM | POA: Diagnosis not present

## 2021-04-27 DIAGNOSIS — Z7901 Long term (current) use of anticoagulants: Secondary | ICD-10-CM | POA: Diagnosis not present

## 2021-04-27 DIAGNOSIS — M5431 Sciatica, right side: Secondary | ICD-10-CM | POA: Diagnosis not present

## 2021-04-27 DIAGNOSIS — F064 Anxiety disorder due to known physiological condition: Secondary | ICD-10-CM | POA: Diagnosis not present

## 2021-04-27 DIAGNOSIS — N182 Chronic kidney disease, stage 2 (mild): Secondary | ICD-10-CM | POA: Diagnosis not present

## 2021-04-27 DIAGNOSIS — J45909 Unspecified asthma, uncomplicated: Secondary | ICD-10-CM | POA: Diagnosis not present

## 2021-04-27 DIAGNOSIS — G8929 Other chronic pain: Secondary | ICD-10-CM | POA: Diagnosis not present

## 2021-04-27 DIAGNOSIS — I255 Ischemic cardiomyopathy: Secondary | ICD-10-CM | POA: Diagnosis not present

## 2021-04-27 DIAGNOSIS — I2510 Atherosclerotic heart disease of native coronary artery without angina pectoris: Secondary | ICD-10-CM | POA: Diagnosis not present

## 2021-04-27 DIAGNOSIS — I5023 Acute on chronic systolic (congestive) heart failure: Secondary | ICD-10-CM | POA: Diagnosis not present

## 2021-04-27 DIAGNOSIS — D631 Anemia in chronic kidney disease: Secondary | ICD-10-CM | POA: Diagnosis not present

## 2021-04-27 DIAGNOSIS — Z95810 Presence of automatic (implantable) cardiac defibrillator: Secondary | ICD-10-CM | POA: Diagnosis not present

## 2021-04-27 DIAGNOSIS — I5022 Chronic systolic (congestive) heart failure: Secondary | ICD-10-CM | POA: Diagnosis not present

## 2021-04-27 DIAGNOSIS — Z85850 Personal history of malignant neoplasm of thyroid: Secondary | ICD-10-CM | POA: Diagnosis not present

## 2021-04-27 DIAGNOSIS — Z951 Presence of aortocoronary bypass graft: Secondary | ICD-10-CM | POA: Diagnosis not present

## 2021-04-27 DIAGNOSIS — I252 Old myocardial infarction: Secondary | ICD-10-CM | POA: Diagnosis not present

## 2021-04-27 DIAGNOSIS — I130 Hypertensive heart and chronic kidney disease with heart failure and stage 1 through stage 4 chronic kidney disease, or unspecified chronic kidney disease: Secondary | ICD-10-CM | POA: Diagnosis not present

## 2021-04-27 DIAGNOSIS — Z79891 Long term (current) use of opiate analgesic: Secondary | ICD-10-CM | POA: Diagnosis not present

## 2021-04-27 DIAGNOSIS — I340 Nonrheumatic mitral (valve) insufficiency: Secondary | ICD-10-CM | POA: Diagnosis not present

## 2021-04-27 DIAGNOSIS — Z7982 Long term (current) use of aspirin: Secondary | ICD-10-CM | POA: Diagnosis not present

## 2021-04-27 DIAGNOSIS — K219 Gastro-esophageal reflux disease without esophagitis: Secondary | ICD-10-CM | POA: Diagnosis not present

## 2021-04-27 DIAGNOSIS — Z5181 Encounter for therapeutic drug level monitoring: Secondary | ICD-10-CM | POA: Diagnosis not present

## 2021-04-27 DIAGNOSIS — M109 Gout, unspecified: Secondary | ICD-10-CM | POA: Diagnosis not present

## 2021-04-27 DIAGNOSIS — Z952 Presence of prosthetic heart valve: Secondary | ICD-10-CM | POA: Diagnosis not present

## 2021-05-06 ENCOUNTER — Other Ambulatory Visit: Payer: Self-pay | Admitting: Family Medicine

## 2021-05-06 ENCOUNTER — Other Ambulatory Visit (HOSPITAL_COMMUNITY): Payer: Self-pay | Admitting: *Deleted

## 2021-05-06 DIAGNOSIS — Z95811 Presence of heart assist device: Secondary | ICD-10-CM

## 2021-05-06 DIAGNOSIS — M545 Low back pain, unspecified: Secondary | ICD-10-CM | POA: Diagnosis not present

## 2021-05-06 DIAGNOSIS — G8929 Other chronic pain: Secondary | ICD-10-CM | POA: Diagnosis not present

## 2021-05-06 DIAGNOSIS — M542 Cervicalgia: Secondary | ICD-10-CM | POA: Diagnosis not present

## 2021-05-09 ENCOUNTER — Ambulatory Visit (HOSPITAL_COMMUNITY)
Admission: RE | Admit: 2021-05-09 | Discharge: 2021-05-09 | Disposition: A | Discharge status: Home / Self Care | Payer: Medicaid Other | Source: Ambulatory Visit | Attending: Cardiology | Admitting: Cardiology

## 2021-05-09 ENCOUNTER — Ambulatory Visit (INDEPENDENT_AMBULATORY_CARE_PROVIDER_SITE_OTHER): Payer: Medicaid Other

## 2021-05-09 ENCOUNTER — Other Ambulatory Visit: Payer: Self-pay

## 2021-05-09 ENCOUNTER — Ambulatory Visit (HOSPITAL_COMMUNITY): Payer: Self-pay

## 2021-05-09 ENCOUNTER — Encounter (HOSPITAL_COMMUNITY): Payer: Self-pay

## 2021-05-09 VITALS — BP 112/49 | HR 75 | Wt 175.8 lb

## 2021-05-09 DIAGNOSIS — Z7984 Long term (current) use of oral hypoglycemic drugs: Secondary | ICD-10-CM | POA: Insufficient documentation

## 2021-05-09 DIAGNOSIS — I5022 Chronic systolic (congestive) heart failure: Secondary | ICD-10-CM | POA: Diagnosis not present

## 2021-05-09 DIAGNOSIS — Z79899 Other long term (current) drug therapy: Secondary | ICD-10-CM | POA: Diagnosis not present

## 2021-05-09 DIAGNOSIS — Z951 Presence of aortocoronary bypass graft: Secondary | ICD-10-CM | POA: Diagnosis not present

## 2021-05-09 DIAGNOSIS — Z7982 Long term (current) use of aspirin: Secondary | ICD-10-CM | POA: Diagnosis not present

## 2021-05-09 DIAGNOSIS — Z95811 Presence of heart assist device: Secondary | ICD-10-CM | POA: Insufficient documentation

## 2021-05-09 DIAGNOSIS — I255 Ischemic cardiomyopathy: Secondary | ICD-10-CM | POA: Diagnosis not present

## 2021-05-09 DIAGNOSIS — N182 Chronic kidney disease, stage 2 (mild): Secondary | ICD-10-CM | POA: Diagnosis not present

## 2021-05-09 DIAGNOSIS — I252 Old myocardial infarction: Secondary | ICD-10-CM | POA: Insufficient documentation

## 2021-05-09 DIAGNOSIS — Z8774 Personal history of (corrected) congenital malformations of heart and circulatory system: Secondary | ICD-10-CM | POA: Insufficient documentation

## 2021-05-09 DIAGNOSIS — I2510 Atherosclerotic heart disease of native coronary artery without angina pectoris: Secondary | ICD-10-CM | POA: Insufficient documentation

## 2021-05-09 DIAGNOSIS — N1831 Chronic kidney disease, stage 3a: Secondary | ICD-10-CM | POA: Insufficient documentation

## 2021-05-09 DIAGNOSIS — Z7901 Long term (current) use of anticoagulants: Secondary | ICD-10-CM | POA: Insufficient documentation

## 2021-05-09 DIAGNOSIS — R002 Palpitations: Secondary | ICD-10-CM | POA: Diagnosis not present

## 2021-05-09 DIAGNOSIS — Z905 Acquired absence of kidney: Secondary | ICD-10-CM | POA: Insufficient documentation

## 2021-05-09 DIAGNOSIS — I10 Essential (primary) hypertension: Secondary | ICD-10-CM

## 2021-05-09 DIAGNOSIS — Z91040 Latex allergy status: Secondary | ICD-10-CM | POA: Diagnosis not present

## 2021-05-09 DIAGNOSIS — I5042 Chronic combined systolic (congestive) and diastolic (congestive) heart failure: Secondary | ICD-10-CM

## 2021-05-09 DIAGNOSIS — D509 Iron deficiency anemia, unspecified: Secondary | ICD-10-CM | POA: Insufficient documentation

## 2021-05-09 DIAGNOSIS — I130 Hypertensive heart and chronic kidney disease with heart failure and stage 1 through stage 4 chronic kidney disease, or unspecified chronic kidney disease: Secondary | ICD-10-CM | POA: Insufficient documentation

## 2021-05-09 DIAGNOSIS — Z7682 Awaiting organ transplant status: Secondary | ICD-10-CM | POA: Diagnosis not present

## 2021-05-09 DIAGNOSIS — Z888 Allergy status to other drugs, medicaments and biological substances status: Secondary | ICD-10-CM | POA: Insufficient documentation

## 2021-05-09 DIAGNOSIS — Z452 Encounter for adjustment and management of vascular access device: Secondary | ICD-10-CM | POA: Diagnosis not present

## 2021-05-09 DIAGNOSIS — Z882 Allergy status to sulfonamides status: Secondary | ICD-10-CM | POA: Insufficient documentation

## 2021-05-09 LAB — CBC
HCT: 36.1 % — ABNORMAL LOW (ref 39.0–52.0)
Hemoglobin: 11.2 g/dL — ABNORMAL LOW (ref 13.0–17.0)
MCH: 25.7 pg — ABNORMAL LOW (ref 26.0–34.0)
MCHC: 31 g/dL (ref 30.0–36.0)
MCV: 83 fL (ref 80.0–100.0)
Platelets: 142 10*3/uL — ABNORMAL LOW (ref 150–400)
RBC: 4.35 MIL/uL (ref 4.22–5.81)
RDW: 18.7 % — ABNORMAL HIGH (ref 11.5–15.5)
WBC: 3.4 10*3/uL — ABNORMAL LOW (ref 4.0–10.5)
nRBC: 0 % (ref 0.0–0.2)

## 2021-05-09 LAB — BASIC METABOLIC PANEL
Anion gap: 8 (ref 5–15)
BUN: 33 mg/dL — ABNORMAL HIGH (ref 6–20)
CO2: 28 mmol/L (ref 22–32)
Calcium: 8.8 mg/dL — ABNORMAL LOW (ref 8.9–10.3)
Chloride: 101 mmol/L (ref 98–111)
Creatinine, Ser: 1.47 mg/dL — ABNORMAL HIGH (ref 0.61–1.24)
GFR, Estimated: 55 mL/min — ABNORMAL LOW (ref >60)
Glucose, Bld: 101 mg/dL — ABNORMAL HIGH (ref 70–99)
Potassium: 4.3 mmol/L (ref 3.5–5.1)
Sodium: 137 mmol/L (ref 135–145)

## 2021-05-09 LAB — PROTIME-INR
INR: 1.3 — ABNORMAL HIGH (ref 0.8–1.2)
Prothrombin Time: 16.4 seconds — ABNORMAL HIGH (ref 11.4–15.2)

## 2021-05-09 LAB — LACTATE DEHYDROGENASE: LDH: 181 U/L (ref 98–192)

## 2021-05-09 MED ORDER — ALLOPURINOL 100 MG PO TABS: 100.0000 mg | ORAL_TABLET | Freq: Every day | ORAL | 5 refills | Status: DC

## 2021-05-09 MED ORDER — NITROGLYCERIN 0.4 MG SL SUBL: 0.4000 mg | SUBLINGUAL_TABLET | SUBLINGUAL | 3 refills | Status: DC | PRN

## 2021-05-09 MED ORDER — CARVEDILOL 6.25 MG PO TABS: 6.2500 mg | ORAL_TABLET | Freq: Two times a day (BID) | ORAL | 5 refills | Status: DC

## 2021-05-09 NOTE — Patient Instructions (Signed)
1. Continue taking Coreg 6.25 mg twice daily. May hold Coreg if systolic BP less than 90 you may hold Coreg. Resume taking Coreg once systolic greater than 90. 2. If diastolic is greater than 90 you may take 1 Nitroglycerin tablet 3. May resume taking Allopurinol 100 mg daily 4. Take Torsemide Monday, Wednesday, Friday. If you need to take extra, you may 5. You have an echo scheduled for June 7th at 3 pm 6. Return to [place] clinic in 1 month

## 2021-05-09 NOTE — Progress Notes (Signed)
Patient presents for 1 month f/u in [facility] today alone. His niece [name] (caregiver) is participating over the phone. Reports no problems with VAD equipment or concerns with drive line.   Pt states he is feeling "fine" today, but "sometimes I feel like I am dying." Pt presented to [facility] ER on Friday c/o shortness of breath and palpitations. When asked about his ER visit he states "I don't remember why I went to the ER." Denies shortness of breath today; states "this ties in with my blood pressure." Denies palpitations when asked but states "it happens when it happens. It is not every day, but happens frequently." After he made that statement he c/o palpitations "my heart is racing." Pt currently on monitor- SR 70s with occasional PVC. Pt wore Zio patch for 2 weeks last month, but did not return device. Reports his niece misplaced the box, but they found it over the weekend and returned it on Sunday.   Reports he is lightheaded "a lot, but this has been going on for years and nothing helps." Reports this occurs most often when he stands up, but resolved quickly. Denies dizziness or falls.   C/o intermittent severe headaches. Had CT in the ER at [facility]. No acute intracranial abnormalities.    Denies headache today. Reports severe headache when "my BP is sky high." Per ER reports pt told them he had not been taking his Coreg for the last 5 days because his blood pressure was low at home. Today he reports he resumed taking Coreg 6.25 mg BID on Friday as instructed by ER physician. Reports he took 1 Nitroglycerin tablet last night "because my diastolic BP was high at 932. Anything over 90 I feel really sick." Nitro improved BP per patient.   Reports he has been taking Torsemide 20 mg daily for the last 1.5 weeks because his left ankle was swollen. Encouraged him to wear compression hose. Per Dr Haroldine Laws continue taking Monday/Wedneday/Friday as ordered, and he may take extra as needed.   Dr Haroldine Laws had  long discussion with patient today about the importance of medication compliance in regards to transplant. Pt has history of making medication adjustments as he feels are needed, and not as ordered. Discussed that he may hold Coreg if his systolic BP is < 90; resume taking Coreg when SBP > 90. He may take Nitroglycerin tablet if diastolic BP > 90. He is to continue his Entresto, Imdur, Hydralazine, and Spironolactone as ordered. Pt and niece in agreement with the above plan. He may resume taking Allopurinol 100 mg daily per Dr Haroldine Laws. Dr Haroldine Laws discussed the above with [facility] and they are in agreement.   Pt canceled echo scheduled for 04/14/21, and missed his feraheme appointment 04/21/21. He is unsure why he called and canceled echo, and states he "did not know" about feraheme. Pt has echo scheduled 05/31/21 at 3 pm. Provided patient with appointment card for echo, highlighted appointment on AVS, and spoke with his niece [name] about his echo appointment. Provided him with infusion clinic phone number for him to call and reschedule feraheme based off his schedule. Made [name] aware of need for pt to make feraheme appointment himself; she verbalized understanding. Highlighted and reviewed VAD clinic appointment (and provided appt card) for 06/14/21 at 0900 with both pt and niece. They verbalized understanding.   Pt scheduled to start cardiac rehab June 2nd. I highlighted this appointment on his AVS, and discussed this with his niece [name].   Vital Signs:  Doppler  Pressure: 92 Automatc BP: 112/49 (91) HR: 75 SR SPO2: 99%   Weight: 175.8 lb w/ eqt Last weight: 171.8 lb w/o eqt Home weights: 175 - 177 lbs    VAD Indication: Destination Therapy - HM III implanted at [facility] 06/04/20   VAD interrogation: Speed: 5800 Flow: 4.7 Power: 4.5w    PI: 3.2 Alarms: none Events: 10-20 PI events daily  Fixed speed: 5800 Low speed limit: 5500  Primary controller: back up battery due for replacement in 21  months Secondary controller:  back up battery due for replacement in  months-- did not bring today   I reviewed the LVAD parameters from today and compared the results to the patient's prior recorded data. LVAD interrogation was NEGATIVE for significant power changes, NEGATIVE for clinical alarms and STABLE for PI events/speed drops. No programming changes were made and pump is functioning within specified parameters. Pt is performing daily controller and system monitor self tests along with completing weekly and monthly maintenance for LVAD equipment.   LVAD equipment check completed and is in good working order. Back-up equipment not present.   Annual Equipment Maintenance on UBC/PM maintained by [facility].   Exit Site Care: VAD dressing maintained weekly by patient. Dressing clean, dry, intact. Anchor in place and correctly applied. Exit site well healed and incorporated.The velour is fully implanted at exit site. No redness, tenderness, drainage, or foul odor noted. Pt denies fever or chills. Pt obtains dressing supplies from [place]. States he has adequate dressing supplies at home.    Device: [employer] single Therapies: VF 250 VT 210         VT 185 (monitor)  Last check: 03/07/21   BP & Labs:  MAP 92 - Doppler is reflecting MAP   Hgb 11.2 - No S/S of bleeding. Specifically denies melena/BRBPR or nosebleeds.   LDH stable at 181 with established baseline of 150-250. Denies tea-colored urine. No power elevations noted on interrogation.   INR 1.3 today. His INR is managed by [facility]. I routed all lab results to Lyda Perone NP at [facility].   Patient Instructions: 1. Continue taking Coreg 6.25 mg twice daily. May hold Coreg if systolic BP less than 90 you may hold Coreg. Resume taking Coreg once systolic greater than 90. 2. If diastolic is greater than 90 you may take 1 Nitroglycerin tablet 3. May resume taking Allopurinol 100 mg daily 4. Take Torsemide Monday, Wednesday, Friday. If you need to  take extra, you may 5. You have an echo scheduled for June 7th at 3 pm 6. Return to [place] clinic in 1 month   [place] Coordinator  Office: [phone]  24/7 Pager: [phone]

## 2021-05-09 NOTE — Progress Notes (Signed)
INR 1.3 today in clinic  Kinder, PharmD with [facility] LVAD to relay results. He will contact patient with warfarin instructions.

## 2021-05-10 LAB — CUP PACEART REMOTE DEVICE CHECK
Battery Remaining Longevity: 54 mo
Battery Remaining Percentage: 55 %
Brady Statistic RV Percent Paced: 0 %
Date Time Interrogation Session: 20220516051100
HighPow Impedance: 49 Ohm
Implantable Lead Implant Date: 20171114
Implantable Lead Location: 753860
Implantable Lead Model: 181
Implantable Lead Serial Number: 333496
Implantable Pulse Generator Implant Date: 20131220
Lead Channel Impedance Value: 430 Ohm
Lead Channel Pacing Threshold Amplitude: 0.5 V
Lead Channel Pacing Threshold Pulse Width: 0.4 ms
Lead Channel Setting Pacing Amplitude: 2 V
Lead Channel Setting Pacing Pulse Width: 0.4 ms
Lead Channel Setting Sensing Sensitivity: 0.5 mV
Pulse Gen Serial Number: 124654

## 2021-05-11 NOTE — Addendum Note (Signed)
Encounter addended by: Jolaine Artist, MD on: 05/11/2021 12:00 PM  Actions taken: Clinical Note Signed, Level of Service modified, Visit diagnoses modified, Charge Capture section accepted

## 2021-05-11 NOTE — Progress Notes (Signed)
Advanced Heart Failure Clinic Note   Patient ID: Chad Bark., male   DOB: [birth year], 60 y.o.   MRN: [id number]   Primary Cardiologist:  Dr. Glori Bickers   History of Present Illness:  Mahamud is a 60 y.o. male with aevere HTN, CAD s/p MI with CABG 2006 and DES to native PDA in 9563, systolic HF EF 87-56% s/p BOsSCI ICD. 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  Underwent high risk HM-III VAD placement at [facility] 6/21.    Echo 3/22 EF 20% RV moderate HK.  RHC with exercise 3/22 on 5600  Rest RA 9 PCWP 12 CI 2.4 MAP 100 Exercise PCWP 27 CI 2.5 pVO2 7.4 slope 42  Here for f/u to discuss his medications. His niece [name] is on the phone. Currently being worked up for transplant but [place] team with some concerns over self titration of medications. Recently seen at [facility] for neck pain and palpitations. He was not taking torsemide and alsi not been taking carvedilol all the time as he states that he gets sleepy when his systolic blood pressure is less than 100. He checks his blood pressure multiple times per day. He has taken carvedilol once daily for a few days and for approximately 1 week prior he did not take this at all. Now feeling much better. Denies CP or SOB. No edema.     VAD Indication: Destination Therapy- HM III implanted at [facility] 06/04/20  VAD interrogation: Speed: 5800 Flow: 4.7 Power: 4.5w PI: 3.2 Alarms: none Events: 10-20 PI events daily  Fixed speed: 5800 Low speed limit: 5500  Primary controller: back up battery due for replacement in 21 months Secondary controller: back up battery due for replacement in  months-- did not bring     Review of systems complete and found to be negative unless listed in HPI.   Past Medical  History:  Diagnosis Date  . AICD (automatic cardioverter/defibrillator) present   . Anemia   . Anginal pain ([place])   . Anxiety   . Aortic dissection, thoracoabdominal ([place])    7/10: Type I s/p repair  . Arthritis   . Asthma   . CAD (coronary artery disease)    a. s/p CABG 2006;  b. DES to PDA 2011 (cath: Dx not seen, dRCA/PDA tx with DES; S-PDA occluded (culprit), S-Dx occluded, S-RI and OM ok, L-LAD ok  . Carotid stenosis    dopplers 2011: 0-39% bilat.  . Chest pain syndrome   . CHF (congestive heart failure) ([place])   . Chronic bronchitis ([place])   . Chronic lower back pain   . Chronic systolic heart failure (HCC)    a. 12/13 ECHO: EF 35-40%, sept, apical & posterobasal HK, LV mod dil & sys fx mod reduced, mild AI, MV mild reg, TV mild reg  . Complication of anesthesia    "difficult to wake afterwards a couple of times" and patient states he has woken during surgery   . 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 Dyspnea    patient denies at 12/06/2018 appt   . Family history of adverse reaction to anesthesia    "sister hard to wake up"  . GERD (gastroesophageal reflux disease)   .  Gout   . Heart murmur   . HLD (hyperlipidemia)   . HTN (hypertension)    severe  . Myocardial infarction [facility])    "many" (11/07/2016)  . PONV (postoperative nausea and vomiting)   . Sleep apnea    does not use mask   . Thyroid cancer ([place])    Hertle Cell    Current Outpatient Medications  Medication Sig Dispense Refill  . acetaminophen (TYLENOL) 325 MG tablet Take 2 tablets (650 mg total) by mouth every 4 (four) hours as needed for headache or mild pain.    [name] allopurinol (ZYLOPRIM) 100 MG tablet Take 1 tablet (100 mg total) by mouth daily. 30 tablet 5  . aspirin EC 81 MG tablet Take 81 mg by mouth every evening.    . carvedilol (COREG) 6.25 MG tablet Take 1 tablet (6.25 mg total) by mouth 2 (two) times daily with a meal. If systolic is less  than 90 you may hold Coreg until systolic is greater than 90, then resume taking 60 tablet 5  . FARXIGA 5 MG TABS tablet Take 5 mg by mouth every morning.    . fluticasone (FLONASE) 50 MCG/ACT nasal spray Place 2 sprays into both nostrils daily as needed for allergies or rhinitis.     [name] gabapentin (NEURONTIN) 300 MG capsule Take 1 capsule (300 mg total) by mouth at bedtime. (Patient taking differently: Take 300 mg by mouth 3 (three) times daily.) 30 capsule 3  . hydrALAZINE (APRESOLINE) 25 MG tablet Take 100 mg by mouth 3 (three) times daily.    . isosorbide mononitrate (IMDUR) 30 MG 24 hr tablet Take 30 mg by mouth daily.    . methocarbamol (ROBAXIN) 750 MG tablet     . Oxycodone HCl 20 MG TABS Take 20 mg by mouth every 6 (six) hours as needed (For pain).     . potassium chloride (KLOR-CON) 20 MEQ packet Take 20 mEq by mouth as directed.    . potassium chloride SA (KLOR-CON) 20 MEQ tablet TAKE 1 TABLET BY MOUTH TWICE DAILY. TAKE AN EXTRA 2 TABLETS WHEN YOU TAKE METOLAZONE. 135 tablet 0  . sacubitril-valsartan (ENTRESTO) 97-103 MG Take 1 tablet by mouth every 12 (twelve) hours.    [name] spironolactone (ALDACTONE) 25 MG tablet Take 1 tablet by mouth daily.    [name] torsemide (DEMADEX) 20 MG tablet Take 1 tablet (20 mg total) by mouth every Monday, Wednesday, and Friday. (Patient taking differently: Take 20 mg by mouth every Monday, Wednesday, and Friday. Reports taking every day for the last week) 60 tablet 4  . warfarin (COUMADIN) 2 MG tablet Take by mouth. 7 mg daily per [facility]/Heart Failure Team    . Alirocumab (PRALUENT) 75 MG/ML SOAJ Inject 75 mg into the skin every 14 (fourteen) days. (Patient not taking: No sig reported) 2 mL 11  . alum & mag hydroxide-simeth (MAALOX/MYLANTA) 200-200-20 MG/5ML suspension Take 15 mLs by mouth every 6 (six) hours as needed for indigestion or heartburn. (Patient not taking: No sig reported) 355 mL 0  . MITIGARE 0.6 MG CAPS Take 0.6 mg by mouth daily. 120 capsule 0   . nitroGLYCERIN (NITROSTAT) 0.4 MG SL tablet Place 1 tablet (0.4 mg total) under the tongue every 5 (five) minutes as needed for chest pain. May take if diastolic BP is > 90 30 tablet 3   No current facility-administered medications for this encounter.    Allergies  Allergen Reactions  . Contrast Media [Iodinated Diagnostic Agents] Anaphylaxis  .  Iohexol Anaphylaxis and Other (See Comments)    PT HAS ANAPHYLAXIS WITH CONTRAST MEDIA!  . Lipitor [Atorvastatin Calcium] Anaphylaxis and Other (See Comments)    Large doses  . Shellfish Allergy Anaphylaxis  . Sulfa Antibiotics Shortness Of Breath and Swelling  . Sulfonamide Derivatives Shortness Of Breath and Swelling  . Almond Oil Itching    FACIAL/MOUTH ITCHING  . Metrizamide Swelling    SWELLING REACTION UNSPECIFIED   . Zocor [Simvastatin] Other (See Comments)    Muscle cramps  . Food Itching    Shellfish, peaches, almonds, apples & kiwis  . Latex Rash and Other (See Comments)    With long periods of exposure  . Peach [Prunus Persica] Itching    Vital Signs: Vitals:   05/09/21 1020 05/09/21 1033  BP: (!) 92/0 (!) 112/49  Pulse: 75   SpO2: 99%   Weight: 79.7 kg (175 lb 12.8 oz)    Filed Weights   05/09/21 1020  Weight: 79.7 kg (175 lb 12.8 oz)    Wt Readings from Last 3 Encounters:  05/09/21 79.7 kg (175 lb 12.8 oz)  04/04/21 77.9 kg (171 lb 12.8 oz)  04/03/21 80.5 kg (177 lb 6.4 oz)   Vital Signs:  Doppler Pressure: 92 Automatc BP: 112/49 (91) HR: 75 SR SPO2: 99%  Weight: 175.8 lb w/ eqt Last weight: 171.8 lb w/o eqt Home weights: 175 - 177 lbs  Exam: General:  NAD. Light sensitive HEENT: normal x for severe conjunctival injection L>R Neck: supple. JVP not elevated.  Carotids 2+ bilat; no bruits. No lymphadenopathy or thryomegaly appreciated. Cor: LVAD hum.  Lungs: Clear. Abdomen: obese soft, nontender, non-distended. No hepatosplenomegaly. No bruits or masses. Good bowel sounds. Driveline site clean.  Anchor in place.  Extremities: no cyanosis, clubbing, rash. Warm no edema  Neuro: alert & oriented x 3. No focal deficits. Moves all 4 without problem   ASSESSMENT/PLAN:  1. Chronic systolic HF: Ischemic cardiomyopathy.  Echo (2/16) with EF 25%. S/p [employer] ICD. Echo 3/18. EF 20-25%. Echo 04/18/18: EF 20-25%, grade 2 DD. - s/p HM-III VAD at [facility] 6/21 - Echo and RHC at [facility] 3/22 (details above) - Stable NYHA III with VAD support.  - Volume status improved. Continue taking torsemide 20 mg MWF. Can take extra as needed - Continue Entresto 97/103 bid - Continue spiro 25 daily - Continue Farxiga 5 daily - Continue carvedilol 6.25 bid  - Continue hydral 100 tid and Imdur 30 daily - Now listed for transplant at [facility] - We had long discussion about need to be consistent with meds and follow prescribed dosing. He is very willing to do this and says if anything he is just over vigilant about taking his meds and not under vigilant. We have made the following plan. Continue carvedilol 6.25 bid. Discussed that he may hold Coreg if his systolic BP is < 90; resume taking Coreg when SBP > 90. He may take Nitroglycerin tablet if diastolic BP > 90. He is to continue his Entresto, Imdur, Hydralazine, and Spironolactone as ordered. Pt and niece in agreement with the above plan. Keep of amlodipine  - Plan communicated to Lyda Perone at [place] program   3. CAD s/p CABG 2006:  - stable - no s/s angina  4. VAD management - VAD interrogated personally. Parameters stable. - DL site ok  - INR managed by [facility]. No change  5. CKD 3a in setting of solitary kidney s/p dissection - Continue ARNI and Farxiga - creatinine stable at  1.47  6. HTN:  - Meds as above  6. Palpitations - Zio in place  7. Iron-deficiency anemia - have scheduled Feraheme infusion x 3. D/w Pharm D  Total time spent 50 minutes. Over half that time spent discussing above.    Glori Bickers, MD 11:50 AM

## 2021-05-16 ENCOUNTER — Telehealth (HOSPITAL_COMMUNITY): Payer: Self-pay | Admitting: Student-PharmD

## 2021-05-18 NOTE — Addendum Note (Signed)
Encounter addended by: Micki Riley, RN on: 05/18/2021 10:02 PM  Actions taken: Imaging Exam ended

## 2021-05-19 DIAGNOSIS — Z95811 Presence of heart assist device: Secondary | ICD-10-CM | POA: Diagnosis not present

## 2021-05-24 ENCOUNTER — Encounter (HOSPITAL_COMMUNITY)
Admission: RE | Admit: 2021-05-24 | Discharge: 2021-05-24 | Disposition: A | Discharge status: Home / Self Care | Payer: Medicaid Other | Source: Ambulatory Visit | Attending: Internal Medicine | Admitting: Internal Medicine

## 2021-05-24 ENCOUNTER — Other Ambulatory Visit: Payer: Self-pay

## 2021-05-24 ENCOUNTER — Telehealth (HOSPITAL_COMMUNITY): Payer: Self-pay | Admitting: *Deleted

## 2021-05-24 NOTE — Telephone Encounter (Signed)
Spoke with the patient. Confirmed appointment for orientation. Completed health history.Barnet Pall, RN,BSN 05/24/2021 9:12 AM

## 2021-05-25 ENCOUNTER — Other Ambulatory Visit (HOSPITAL_COMMUNITY): Payer: Self-pay | Admitting: *Deleted

## 2021-05-26 ENCOUNTER — Encounter (HOSPITAL_COMMUNITY)
Admission: RE | Admit: 2021-05-26 | Discharge: 2021-05-26 | Disposition: A | Discharge status: Home / Self Care | Payer: Medicaid Other | Source: Ambulatory Visit | Attending: Internal Medicine | Admitting: Internal Medicine

## 2021-05-26 ENCOUNTER — Encounter (HOSPITAL_COMMUNITY): Payer: Self-pay

## 2021-05-26 ENCOUNTER — Other Ambulatory Visit: Payer: Self-pay

## 2021-05-26 VITALS — BP 96/0 | HR 73 | Ht 71.25 in | Wt 177.2 lb

## 2021-05-26 DIAGNOSIS — Z95811 Presence of heart assist device: Secondary | ICD-10-CM | POA: Insufficient documentation

## 2021-05-26 DIAGNOSIS — D509 Iron deficiency anemia, unspecified: Secondary | ICD-10-CM | POA: Diagnosis present

## 2021-05-26 DIAGNOSIS — I5022 Chronic systolic (congestive) heart failure: Secondary | ICD-10-CM

## 2021-05-26 MED ORDER — SODIUM CHLORIDE 0.9 % IV SOLN
510.0000 mg | Freq: Once | INTRAVENOUS | Status: AC
  Administered 2021-05-26: 510 mg via INTRAVENOUS
  Filled 2021-05-26: qty 510

## 2021-05-26 NOTE — Progress Notes (Signed)
Cardiac Individual Treatment Plan  Patient Details  Name: Chad [name]. MRN: [id number] Date of Birth: [birth year] Referring Provider:   Flowsheet Row CARDIAC REHAB PHASE II ORIENTATION from 05/26/2021 in [place]  Referring Provider Glori Bickers, MD      Initial Encounter Date:  [place] PHASE II ORIENTATION from 05/26/2021 in [place]  Date 05/26/21      Visit Diagnosis: Heart failure, chronic systolic ([place])  S/P LVAD placement at [facility] 6/21  Patient's Home Medications on Admission:  Current Outpatient Medications:  .  acetaminophen (TYLENOL) 325 MG tablet, Take 2 tablets (650 mg total) by mouth every 4 (four) hours as needed for headache or mild pain., Disp: , Rfl:  .  Alirocumab (PRALUENT) 75 MG/ML SOAJ, Inject 75 mg into the skin every 14 (fourteen) days. (Patient not taking: Reported on 05/16/2021), Disp: 2 mL, Rfl: 11 .  allopurinol (ZYLOPRIM) 100 MG tablet, Take 1 tablet (100 mg total) by mouth daily., Disp: 30 tablet, Rfl: 5 .  alum & mag hydroxide-simeth (MAALOX/MYLANTA) 798-921-19 MG/5ML suspension, Take 15 mLs by mouth every 6 (six) hours as needed for indigestion or heartburn. (Patient not taking: No sig reported), Disp: 355 mL, Rfl: 0 .  aspirin EC 81 MG tablet, Take 81 mg by mouth every evening., Disp: , Rfl:  .  carvedilol (COREG) 6.25 MG tablet, Take 1 tablet (6.25 mg total) by mouth 2 (two) times daily with a meal. If systolic is less than 90 you may hold Coreg until systolic is greater than 90, then resume taking, Disp: 60 tablet, Rfl: 5 .  FARXIGA 5 MG TABS tablet, Take 5 mg by mouth every morning., Disp: , Rfl:  .  fluticasone (FLONASE) 50 MCG/ACT nasal spray, Place 2 sprays into both nostrils daily as needed for allergies or rhinitis. , Disp: , Rfl:  .  gabapentin (NEURONTIN) 300 MG capsule, Take 1 capsule (300 mg total) by mouth at bedtime. (Patient taking differently: Take  300 mg by mouth 3 (three) times daily.), Disp: 30 capsule, Rfl: 3 .  hydrALAZINE (APRESOLINE) 25 MG tablet, Take 100 mg by mouth 3 (three) times daily., Disp: , Rfl:  .  isosorbide mononitrate (IMDUR) 30 MG 24 hr tablet, Take 30 mg by mouth daily., Disp: , Rfl:  .  methocarbamol (ROBAXIN) 750 MG tablet, Take 750 mg by mouth daily., Disp: , Rfl:  .  MITIGARE 0.6 MG CAPS, Take 0.6 mg by mouth daily., Disp: 120 capsule, Rfl: 0 .  nitroGLYCERIN (NITROSTAT) 0.4 MG SL tablet, Place 1 tablet (0.4 mg total) under the tongue every 5 (five) minutes as needed for chest pain. May take if diastolic BP is > 90 (Patient not taking: Reported on 05/16/2021), Disp: 30 tablet, Rfl: 3 .  Oxycodone HCl 20 MG TABS, Take 20 mg by mouth every 6 (six) hours as needed (For pain). , Disp: , Rfl:  .  potassium chloride (KLOR-CON) 20 MEQ packet, Take 20 mEq by mouth as directed. (Patient not taking: Reported on 05/16/2021), Disp: , Rfl:  .  potassium chloride SA (KLOR-CON) 20 MEQ tablet, TAKE 1 TABLET BY MOUTH TWICE DAILY. TAKE AN EXTRA 2 TABLETS WHEN YOU TAKE METOLAZONE. (Patient taking differently: Take 20 mEq by mouth daily as needed. As needed, if taking torsemide), Disp: 135 tablet, Rfl: 0 .  sacubitril-valsartan (ENTRESTO) 97-103 MG, Take 1 tablet by mouth every 12 (twelve) hours., Disp: , Rfl:  .  spironolactone (ALDACTONE) 25 MG  tablet, Take 1 tablet by mouth daily., Disp: , Rfl:  .  torsemide (DEMADEX) 20 MG tablet, Take 1 tablet (20 mg total) by mouth every Monday, Wednesday, and Friday. (Patient taking differently: Take 20 mg by mouth every Monday, Wednesday, and Friday. Reports taking every day for the last week), Disp: 60 tablet, Rfl: 4 .  warfarin (COUMADIN) 2 MG tablet, Take by mouth. 7 mg daily per [facility]/Heart Failure Team, Disp: , Rfl:   Past Medical History: Past Medical History:  Diagnosis Date  . AICD (automatic cardioverter/defibrillator) present   . Anemia   . Anginal pain ([place])   . Anxiety   .  Aortic dissection, thoracoabdominal ([place])    7/10: Type I s/p repair  . Arthritis   . Asthma   . CAD (coronary artery disease)    a. s/p CABG 2006;  b. DES to PDA 2011 (cath: Dx not seen, dRCA/PDA tx with DES; S-PDA occluded ([place]), S-Dx occluded, S-RI and OM ok, L-LAD ok  . Carotid stenosis    dopplers 2011: 0-39% bilat.  . Chest pain syndrome   . CHF (congestive heart failure) ([place])    06/14/21 LVAD placemnt [facility]  . Chronic bronchitis ([place])   . Chronic lower back pain   . Chronic systolic heart failure (H[facility])    a. 12/13 ECHO: EF 35-40%, sept, apical & posterobasal HK, LV mod dil & sys fx mod reduced, mild AI, MV mild reg, TV mild reg  . Complication of anesthesia    "difficult to wake afterwards a couple of times" and patient states he has woken during surgery   . 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 Dyspnea    patient denies at 12/06/2018 appt   . Family history of adverse reaction to anesthesia    "sister hard to wake up"  . GERD (gastroesophageal reflux disease)   . Gout   . Heart murmur   . HLD (hyperlipidemia)   . HTN (hypertension)    severe  . Myocardial infarction [facility])    "many" (11/07/2016)  . PONV (postoperative nausea and vomiting)   . Sleep apnea    does not use mask   . Thyroid cancer ([place])    [employer]    Tobacco Use: Social History   Tobacco Use  Smoking Status Never Smoker  Smokeless Tobacco Never Used    Labs: Recent Review Flowsheet Data    Labs for ITP Cardiac and Pulmonary Rehab Latest Ref Rng & Units 05/01/2020 05/02/2020 05/03/2020 05/03/2020 09/28/2020   Cholestrol 100 - 199 mg/dL - - - - 134   LDLCALC 0 - 99 mg/dL - - - - 74   LDLDIRECT mg/dL - - - - -   HDL >39 mg/dL - - - - 42   Trlycerides 0 - 149 mg/dL - - - - 98   Hemoglobin A1c 4.0 - 5.6 % - - - - -   PHART 7.350 - 7.450 - - - - -   PCO2ART 35.0 - 45.0 mmHg - - - - -   HCO3 20.0 - 28.0 mmol/L - - - - -   TCO2 22 - 32  mmol/L - - - - -   ACIDBASEDEF 0.0 - 2.0 mmol/L - - - - -   O2SAT % 65.6 66.4 45.6 59.1 -      Capillary Blood Glucose: Lab Results  Component Value Date   GLUCAP 128 (H) 06/20/2010  GLUCAP 133 (H) 04/29/2010   GLUCAP 122 (H) 06/24/2009   GLUCAP 141 (H) 06/24/2009   GLUCAP 100 (H) 06/24/2009     Exercise Target Goals: Exercise Program Goal: Individual exercise prescription set using results from initial 6 min walk test and THRR while considering  patient's activity barriers and safety.   Exercise Prescription Goal: Starting with aerobic activity 30 plus minutes a day, 3 days per week for initial exercise prescription. Provide home exercise prescription and guidelines that participant acknowledges understanding prior to discharge.  Activity Barriers & Risk Stratification:  Activity Barriers & Cardiac Risk Stratification - 05/26/21 1150      Activity Barriers & Cardiac Risk Stratification   Activity Barriers Arthritis;Back Problems;Joint Problems;Decreased Ventricular Function;History of Falls;Other (comment)    Comments LVAD    Cardiac Risk Stratification High           6 Minute Walk:  6 Minute Walk    Row Name 05/26/21 1044         6 Minute Walk   Phase Initial     Distance 1343 feet     Walk Time 6 minutes     # of Rest Breaks 0     MPH 2.54     METS 3.67     RPE 9     Perceived Dyspnea  0     VO2 Peak 12.84     Symptoms Yes (comment)     Comments Low back pain (chronic) 7/10     Resting HR 73 bpm     Resting BP 92/0  MAP     Resting Oxygen Saturation  97 %     Exercise Oxygen Saturation  during 6 min walk 96 %     Max Ex. HR 105 bpm     Max Ex. BP 96/0  MAP     2 Minute Post BP 92/0  MAP            Oxygen Initial Assessment:   Oxygen Re-Evaluation:   Oxygen Discharge (Final Oxygen Re-Evaluation):   Initial Exercise Prescription:  Initial Exercise Prescription - 05/26/21 1100      Date of Initial Exercise RX and Referring Provider    Date 05/26/21    Referring Provider Glori Bickers, MD    Expected Discharge Date 07/22/21      NuStep   Level 2    SPM 85    Minutes 30    METs 2.4      Prescription Details   Frequency (times per week) 3    Duration Progress to 30 minutes of continuous aerobic without signs/symptoms of physical distress      Intensity   THRR 40-80% of Max Heartrate 65 - 130    Ratings of Perceived Exertion 11-13    Perceived Dyspnea 0-4      Progression   Progression Continue progressive overload as per policy without signs/symptoms or physical distress.      Resistance Training   Training Prescription Yes    Weight 4 lbs    Reps 10-15           Perform Capillary Blood Glucose checks as needed.  Exercise Prescription Changes:   Exercise Comments:   Exercise Goals and Review:   Exercise Goals    Row Name 05/26/21 1150             Exercise Goals   Increase Physical Activity Yes       Intervention Provide advice, education, support and counseling about physical activity/exercise  needs.;Develop an individualized exercise prescription for aerobic and resistive training based on initial evaluation findings, risk stratification, comorbidities and participant's personal goals.       Expected Outcomes Short Term: Attend rehab on a regular basis to increase amount of physical activity.;Long Term: Add in home exercise to make exercise part of routine and to increase amount of physical activity.;Long Term: Exercising regularly at least 3-5 days a week.       Increase Strength and Stamina Yes       Intervention Provide advice, education, support and counseling about physical activity/exercise needs.;Develop an individualized exercise prescription for aerobic and resistive training based on initial evaluation findings, risk stratification, comorbidities and participant's personal goals.       Expected Outcomes Short Term: Increase workloads from initial exercise prescription for  resistance, speed, and METs.;Short Term: Perform resistance training exercises routinely during rehab and add in resistance training at home;Long Term: Improve cardiorespiratory fitness, muscular endurance and strength as measured by increased METs and functional capacity (6MWT)       Able to understand and use rate of perceived exertion (RPE) scale Yes       Intervention Provide education and explanation on how to use RPE scale       Expected Outcomes Short Term: Able to use RPE daily in rehab to express subjective intensity level;Long Term:  Able to use RPE to guide intensity level when exercising independently       Knowledge and understanding of Target Heart Rate Range (THRR) Yes       Intervention Provide education and explanation of THRR including how the numbers were predicted and where they are located for reference       Expected Outcomes Short Term: Able to state/look up THRR;Short Term: Able to use daily as guideline for intensity in rehab;Long Term: Able to use THRR to govern intensity when exercising independently       Understanding of Exercise Prescription Yes       Intervention Provide education, explanation, and written materials on patient's individual exercise prescription       Expected Outcomes Short Term: Able to explain program exercise prescription;Long Term: Able to explain home exercise prescription to exercise independently              Exercise Goals Re-Evaluation :    Discharge Exercise Prescription (Final Exercise Prescription Changes):   Nutrition:  Target Goals: Understanding of nutrition guidelines, daily intake of sodium 1500mg , cholesterol 200mg , calories 30% from fat and 7% or less from saturated fats, daily to have 5 or more servings of fruits and vegetables.  Biometrics:  Pre Biometrics - 05/26/21 0900      Pre Biometrics   Waist Circumference 37 inches    Hip Circumference 38 inches    Waist to Hip Ratio 0.97 %    Triceps Skinfold 16 mm    %  Body Fat 24.6 %    Grip Strength 60 kg    Flexibility 17 in    Single Leg Stand 21.5 seconds            Nutrition Therapy Plan and Nutrition Goals:   Nutrition Assessments:  MEDIFICTS Score Key:  ?70 Need to make dietary changes   40-70 Heart Healthy Diet  ? 40 Therapeutic Level Cholesterol Diet   Picture Your Plate Scores:  <40 Unhealthy dietary pattern with much room for improvement.  41-50 Dietary pattern unlikely to meet recommendations for good health and room for improvement.  51-60 More healthful dietary pattern,  with some room for improvement.   >60 Healthy dietary pattern, although there may be some specific behaviors that could be improved.    Nutrition Goals Re-Evaluation:   Nutrition Goals Discharge (Final Nutrition Goals Re-Evaluation):   Psychosocial: Target Goals: Acknowledge presence or absence of significant depression and/or stress, maximize coping skills, provide positive support system. Participant is able to verbalize types and ability to use techniques and skills needed for reducing stress and depression.  Initial Review & Psychosocial Screening:  Initial Psych Review & Screening - 05/26/21 1346      Initial Review   Current issues with Current Stress Concerns    Source of Stress Concerns Unable to perform yard/household activities;Unable to participate in former interests or hobbies;Retirement/disability      Family Dynamics   Good Support System? Yes   Jamear lives with his son and one of his neices     Barriers   Psychosocial barriers to participate in program The patient should benefit from training in stress management and relaxation.      Screening Interventions   Interventions Encouraged to exercise    Expected Outcomes Long Term Goal: Stressors or current issues are controlled or eliminated.           Quality of Life Scores:  Quality of Life - 05/26/21 1258      Quality of Life   Select Quality of Life      Quality  of Life Scores   Health/Function Pre 21 %    Socioeconomic Pre 25.71 %    Psych/Spiritual Pre 30 %    Family Pre 30 %    GLOBAL Pre 25.41 %          Scores of 19 and below usually indicate a poorer quality of life in these areas.  A difference of  2-3 points is a clinically meaningful difference.  A difference of 2-3 points in the total score of the Quality of Life Index has been associated with significant improvement in overall quality of life, self-image, physical symptoms, and general health in studies assessing change in quality of life.  PHQ-9: Recent Review Flowsheet Data    Depression screen [facility] 2/9 05/26/2021 05/26/2021 11/02/2020 08/05/2020 04/28/2020   Decreased Interest 0 0 0 0  0   Down, Depressed, Hopeless 0 0 0 0 0   PHQ - 2 Score 0 0 0 0 0   Altered sleeping - - 0 - -   Tired, decreased energy - - 0 - -   Change in appetite - - 0 - -   Feeling bad or failure about yourself  - - 0 - -   Trouble concentrating - - 0 - -   Moving slowly or fidgety/restless - - 0 - -   Suicidal thoughts - - 0 - -   PHQ-9 Score - - 0 - -     Interpretation of Total Score  Total Score Depression Severity:  1-4 = Minimal depression, 5-9 = Mild depression, 10-14 = Moderate depression, 15-19 = Moderately severe depression, 20-27 = Severe depression   Psychosocial Evaluation and Intervention:   Psychosocial Re-Evaluation:   Psychosocial Discharge (Final Psychosocial Re-Evaluation):   Vocational Rehabilitation: Provide vocational rehab assistance to qualifying candidates.   Vocational Rehab Evaluation & Intervention:  Vocational Rehab - 05/26/21 1354      Initial Vocational Rehab Evaluation & Intervention   Assessment shows need for Vocational Rehabilitation No           Education: Education Goals: Education  classes will be provided on a weekly basis, covering required topics. Participant will state understanding/return demonstration of topics presented.  Learning  Barriers/Preferences:  Learning Barriers/Preferences - 05/26/21 1145      Learning Barriers/Preferences   Learning Barriers Sight    Learning Preferences Audio;Computer/Internet;Group Instruction;Individual Instruction;Pictoral;Skilled Demonstration;Verbal Instruction;Video;Written Material           Education Topics: Hypertension, Hypertension Reduction -Define heart disease and high blood pressure. Discus how high blood pressure affects the body and ways to reduce high blood pressure.   Exercise and Your Heart -Discuss why it is important to exercise, the FITT principles of exercise, normal and abnormal responses to exercise, and how to exercise safely.   Angina -Discuss definition of angina, causes of angina, treatment of angina, and how to decrease risk of having angina.   Cardiac Medications -Review what the following cardiac medications are used for, how they affect the body, and side effects that may occur when taking the medications.  Medications include Aspirin, Beta blockers, calcium channel blockers, ACE Inhibitors, angiotensin receptor blockers, diuretics, digoxin, and antihyperlipidemics.   Congestive Heart Failure -Discuss the definition of CHF, how to live with CHF, the signs and symptoms of CHF, and how keep track of weight and sodium intake.   Heart Disease and Intimacy -Discus the effect sexual activity has on the heart, how changes occur during intimacy as we age, and safety during sexual activity.   Smoking Cessation / COPD -Discuss different methods to quit smoking, the health benefits of quitting smoking, and the definition of COPD.   Nutrition I: Fats -Discuss the types of cholesterol, what cholesterol does to the heart, and how cholesterol levels can be controlled.   Nutrition II: Labels -Discuss the different components of food labels and how to read food label   Heart Parts/Heart Disease and PAD -Discuss the anatomy of the heart, the pathway  of blood circulation through the heart, and these are affected by heart disease.   Stress I: Signs and Symptoms -Discuss the causes of stress, how stress may lead to anxiety and depression, and ways to limit stress.   Stress II: Relaxation -Discuss different types of relaxation techniques to limit stress.   Warning Signs of Stroke / TIA -Discuss definition of a stroke, what the signs and symptoms are of a stroke, and how to identify when someone is having stroke.   Knowledge Questionnaire Score:  Knowledge Questionnaire Score - 05/26/21 1144      Knowledge Questionnaire Score   Pre Score 24/24           Core Components/Risk Factors/Patient Goals at Admission:  Personal Goals and Risk Factors at Admission - 05/26/21 1144      Core Components/Risk Factors/Patient Goals on Admission    Weight Management Weight Maintenance;Yes    Intervention Weight Management: Provide education and appropriate resources to help participant work on and attain dietary goals.;Weight Management: Develop a combined nutrition and exercise program designed to reach desired caloric intake, while maintaining appropriate intake of nutrient and fiber, sodium and fats, and appropriate energy expenditure required for the weight goal.    Expected Outcomes Short Term: Continue to assess and modify interventions until short term weight is achieved;Long Term: Adherence to nutrition and physical activity/exercise program aimed toward attainment of established weight goal;Weight Maintenance: Understanding of the daily nutrition guidelines, which includes 25-35% calories from fat, 7% or less cal from saturated fats, less than 200mg  cholesterol, less than 1.5gm of sodium, & 5 or more servings of fruits  and vegetables daily    Heart Failure Yes    Intervention Provide a combined exercise and nutrition program that is supplemented with education, support and counseling about heart failure. Directed toward relieving symptoms  such as shortness of breath, decreased exercise tolerance, and extremity edema.    Expected Outcomes Improve functional capacity of life;Short term: Attendance in program 2-3 days a week with increased exercise capacity. Reported lower sodium intake. Reported increased fruit and vegetable intake. Reports medication compliance.;Short term: Daily weights obtained and reported for increase. Utilizing diuretic protocols set by physician.;Long term: Adoption of self-care skills and reduction of barriers for early signs and symptoms recognition and intervention leading to self-care maintenance.    Hypertension Yes    Intervention Provide education on lifestyle modifcations including regular physical activity/exercise, weight management, moderate sodium restriction and increased consumption of fresh fruit, vegetables, and low fat dairy, alcohol moderation, and smoking cessation.;Monitor prescription use compliance.    Expected Outcomes Short Term: Continued assessment and intervention until BP is < 140/13mm HG in hypertensive participants. < 130/23mm HG in hypertensive participants with diabetes, heart failure or chronic kidney disease.;Long Term: Maintenance of blood pressure at goal levels.    Lipids Yes    Intervention Provide education and support for participant on nutrition & aerobic/resistive exercise along with prescribed medications to achieve LDL 70mg , HDL >40mg .    Expected Outcomes Short Term: Participant states understanding of desired cholesterol values and is compliant with medications prescribed. Participant is following exercise prescription and nutrition guidelines.;Long Term: Cholesterol controlled with medications as prescribed, with individualized exercise RX and with personalized nutrition plan. Value goals: LDL < 70mg , HDL > 40 mg.    Stress Yes    Intervention Offer individual and/or small group education and counseling on adjustment to heart disease, stress management and health-related  lifestyle change. Teach and support self-help strategies.;Refer participants experiencing significant psychosocial distress to appropriate mental health specialists for further evaluation and treatment. When possible, include family members and significant others in education/counseling sessions.    Expected Outcomes Short Term: Participant demonstrates changes in health-related behavior, relaxation and other stress management skills, ability to obtain effective social support, and compliance with psychotropic medications if prescribed.;Long Term: Emotional wellbeing is indicated by absence of clinically significant psychosocial distress or social isolation.           Core Components/Risk Factors/Patient Goals Review:    Core Components/Risk Factors/Patient Goals at Discharge (Final Review):    ITP Comments:  ITP Comments    Row Name 05/26/21 1344           ITP Comments Dr Fransico Him MD, Medical Director              Comments: Zamauri attended orientation on 05/26/2021 to review rules and guidelines for program.  Completed 6 minute walk test, Intitial ITP, and exercise prescription.  . Telemetry-Sinus Rhythm with arrythmia pacing on demand rare Map 92-96. [facility] was called and notified about MAP readings. Hosam did report having lower back pain which is chronic for him . Safety measures and social distancing in place per CDC guidelines.Barnet Pall, RN,BSN 05/26/2021 2:23 PM

## 2021-05-30 ENCOUNTER — Encounter (HOSPITAL_COMMUNITY)
Admission: RE | Admit: 2021-05-30 | Discharge: 2021-05-30 | Disposition: A | Discharge status: Home / Self Care | Payer: Medicaid Other | Source: Ambulatory Visit | Attending: Internal Medicine | Admitting: Internal Medicine

## 2021-05-30 ENCOUNTER — Other Ambulatory Visit: Payer: Self-pay

## 2021-05-30 DIAGNOSIS — Z95811 Presence of heart assist device: Secondary | ICD-10-CM

## 2021-05-30 DIAGNOSIS — I5022 Chronic systolic (congestive) heart failure: Secondary | ICD-10-CM

## 2021-05-30 LAB — GLUCOSE, CAPILLARY: Glucose-Capillary: 100 mg/dL — ABNORMAL HIGH (ref 70–99)

## 2021-05-31 ENCOUNTER — Ambulatory Visit (HOSPITAL_COMMUNITY): Admission: RE | Admit: 2021-05-31 | Payer: Medicaid Other | Source: Ambulatory Visit

## 2021-05-31 NOTE — Progress Notes (Addendum)
Daily Session Note  Patient Details  Name: Chad [name]. MRN: [id number] Date of Birth: 06-18-1961 Referring Provider:   Flowsheet Row CARDIAC REHAB PHASE II ORIENTATION from 05/26/2021 in [place]  Referring Provider Chad Bickers, MD      Encounter Date: 05/30/2021  Check In:  Session Check In - 05/30/21 1524      Check-In   Supervising physician immediately available to respond to emergencies [facility] immediately available    Physician(s) Dr Tana Coast    Location [facility]    Staff Present Barnet Pall, RN, Milus Glazier, MS, ACSM-CEP, CCRP, Exercise Physiologist;Jetta Walker BS, ACSM EP-C, Exercise Physiologist;Other    Virtual Visit No    Medication changes reported     No    Fall or balance concerns reported    No    Tobacco Cessation No Change    Current number of cigarettes/nicotine per day     0    Warm-up and Cool-down Performed on first and last piece of equipment   Cardiac Rehab Orientation   Resistance Training Performed Yes    VAD Patient? Yes    PAD/SET Patient? No      VAD patient   Has back up controller? Yes    Has spare charged batteries? Yes    Has battery cables? Yes    Has compatible battery clips? Yes    Comments Patient taken to Car to get Bag with battery ,clips and back up controller      Pain Assessment   Currently in Pain? No/denies    Pain Score 0-No pain    Multiple Pain Sites No           Capillary Blood Glucose: Results for orders placed or performed during the hospital encounter of 05/30/21 (from the past 24 hour(s))  Glucose, capillary     Status: Abnormal   Collection Time: 05/30/21  4:02 PM  Result Value Ref Range   Glucose-Capillary 100 (H) 70 - 99 mg/dL     Exercise Prescription Changes - 05/30/21 1630      Response to Exercise   Blood Pressure (Admit) 96/0   MAP   Blood Pressure (Exercise) 110/0   MAP   Blood Pressure (Exit) 96/0   MAP   Heart Rate  (Admit) 84 bpm    Heart Rate (Exercise) 110 bpm    Heart Rate (Exit) 83 bpm    Rating of Perceived Exertion (Exercise) 13    Symptoms None    Comments Pt's first day in the CRP2 program    Duration Progress to 30 minutes of  aerobic without signs/symptoms of physical distress    Intensity THRR unchanged      Progression   Progression Continue to progress workloads to maintain intensity without signs/symptoms of physical distress.    Average METs 2.3      Resistance Training   Training Prescription Yes    Weight 3 lbs    Reps 10-15    Time 10 Minutes      Interval Training   Interval Training No      NuStep   Level 2    SPM 80    Minutes 26    METs 2.3           Social History   Tobacco Use  Smoking Status Never Smoker  Smokeless Tobacco Never Used    Goals Met:  Exercise tolerated well No report of cardiac concerns or symptoms Strength training completed  today  Goals Unmet:  Not Applicable  Comments: Chad [name] started cardiac rehab today.  Pt tolerated light exercise without difficulty. Map pre exercie 96-110 on nustep  exit Map 96 , telemetry-Sinus Rhythm with arrythmia, asymptomatic.  Medication list reconciled. Pt denies barriers to medicaiton compliance.  PSYCHOSOCIAL ASSESSMENT:  PHQ-0. Pt exhibits positive coping skills, hopeful outlook with supportive family. No psychosocial needs identified at this time, no psychosocial interventions necessary.  .   Pt oriented to exercise equipment and routine. Patient is somewhat deconditioned. Looks forward to participating in phase 2 cardiac rehab.   Understanding verbalized.Barnet Pall, RN,BSN 05/31/2021 7:49 AM   Dr. Fransico [name] is Medical Director for Cardiac Rehab at [facility].

## 2021-06-01 ENCOUNTER — Other Ambulatory Visit: Payer: Self-pay

## 2021-06-01 ENCOUNTER — Encounter (HOSPITAL_COMMUNITY)
Admission: RE | Admit: 2021-06-01 | Discharge: 2021-06-01 | Disposition: A | Discharge status: Home / Self Care | Payer: Medicaid Other | Source: Ambulatory Visit | Attending: Internal Medicine | Admitting: Internal Medicine

## 2021-06-01 DIAGNOSIS — Z95811 Presence of heart assist device: Secondary | ICD-10-CM

## 2021-06-01 DIAGNOSIS — I5022 Chronic systolic (congestive) heart failure: Secondary | ICD-10-CM

## 2021-06-01 NOTE — Progress Notes (Signed)
Remote ICD transmission.   

## 2021-06-03 ENCOUNTER — Encounter (HOSPITAL_COMMUNITY): Payer: Medicaid Other

## 2021-06-03 ENCOUNTER — Telehealth (HOSPITAL_COMMUNITY): Payer: Self-pay

## 2021-06-03 NOTE — Telephone Encounter (Signed)
Pt called and stating that he didn't feel well and that he wouldn't be able to attend his cardiac rehab session on today. I canceled that session and advised his EP and cardiac rehab nurse.

## 2021-06-06 ENCOUNTER — Other Ambulatory Visit: Payer: Self-pay

## 2021-06-06 ENCOUNTER — Encounter (HOSPITAL_COMMUNITY)
Admission: RE | Admit: 2021-06-06 | Discharge: 2021-06-06 | Disposition: A | Discharge status: Home / Self Care | Payer: Medicaid Other | Source: Ambulatory Visit | Attending: Internal Medicine | Admitting: Internal Medicine

## 2021-06-06 DIAGNOSIS — I5022 Chronic systolic (congestive) heart failure: Secondary | ICD-10-CM

## 2021-06-06 DIAGNOSIS — Z95811 Presence of heart assist device: Secondary | ICD-10-CM | POA: Diagnosis not present

## 2021-06-07 NOTE — Progress Notes (Addendum)
Chad [name]. 60 y.o. male Nutrition Note  Diagnosis: CHF, LVAD  Past Medical History:  Diagnosis Date   AICD (automatic cardioverter/defibrillator) present    Anemia    Anginal pain ([place])    Anxiety    Aortic dissection, thoracoabdominal ([place])    7/10: Type I s/p repair   Arthritis    Asthma    CAD (coronary artery disease)    a. s/p CABG 2006;  b. DES to PDA 2011 (cath: Dx not seen, dRCA/PDA tx with DES; S-PDA occluded (culprit), S-Dx occluded, S-RI and OM ok, L-LAD ok   Carotid stenosis    dopplers 2011: 0-39% bilat.   Chest pain syndrome    CHF (congestive heart failure) ([place])    06/14/21 LVAD placemnt [facility]   Chronic bronchitis ([facility])    Chronic lower back pain    Chronic systolic heart failure ([place])    a. 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   Current Outpatient Medications:    acetaminophen (TYLENOL) 325 MG tablet, Take 2 tablets (650 mg total) by mouth every 4 (four) hours as needed for headache or mild pain., Disp: , Rfl:    Alirocumab (PRALUENT) 75 MG/ML SOAJ, Inject 75 mg into the skin every 14 (fourteen) days. (Patient not taking: Reported on 05/16/2021), Disp: 2 mL, Rfl: 11   allopurinol (ZYLOPRIM) 100  MG tablet, Take 1 tablet (100 mg total) by mouth daily., Disp: 30 tablet, Rfl: 5   alum & mag hydroxide-simeth (MAALOX/MYLANTA) 423-536-14 MG/5ML suspension, Take 15 mLs by mouth every 6 (six) hours as needed for indigestion or heartburn. (Patient not taking: No sig reported), Disp: 355 mL, Rfl: 0   aspirin EC 81 MG tablet, Take 81 mg by mouth every evening., Disp: , Rfl:    carvedilol (COREG) 6.25 MG tablet, Take 1 tablet (6.25 mg total) by mouth 2 (two) times daily with a meal. If systolic is less than 90 you may hold Coreg until systolic is greater than 90, then resume taking, Disp: 60 tablet, Rfl: 5   FARXIGA 5 MG TABS tablet, Take 5 mg by mouth every morning., Disp: , Rfl:    fluticasone (FLONASE) 50 MCG/ACT nasal spray, Place 2 sprays into both nostrils daily as needed for allergies or rhinitis. , Disp: , Rfl:    gabapentin (NEURONTIN) 300 MG capsule, Take 1 capsule (300 mg total) by mouth at bedtime. (Patient taking differently: Take 300 mg by mouth 3 (three) times daily.), Disp: 30 capsule, Rfl: 3   hydrALAZINE (APRESOLINE) 25 MG tablet, Take  100 mg by mouth 3 (three) times daily., Disp: , Rfl:    isosorbide mononitrate (IMDUR) 30 MG 24 hr tablet, Take 30 mg by mouth daily., Disp: , Rfl:    methocarbamol (ROBAXIN) 750 MG tablet, Take 750 mg by mouth daily., Disp: , Rfl:    MITIGARE 0.6 MG CAPS, Take 0.6 mg by mouth daily., Disp: 120 capsule, Rfl: 0   nitroGLYCERIN (NITROSTAT) 0.4 MG SL tablet, Place 1 tablet (0.4 mg total) under the tongue every 5 (five) minutes as needed for chest pain. May take if diastolic BP is > 90 (Patient not taking: Reported on 05/16/2021), Disp: 30 tablet, Rfl: 3   Oxycodone HCl 20 MG TABS, Take 20 mg by mouth every 6 (six) hours as needed (For pain). , Disp: , Rfl:    potassium chloride (KLOR-CON) 20 MEQ packet, Take 20 mEq by mouth as directed. (Patient not taking: Reported on 05/16/2021), Disp: , Rfl:    potassium chloride SA (KLOR-CON) 20 MEQ tablet, TAKE 1 TABLET BY  MOUTH TWICE DAILY. TAKE AN EXTRA 2 TABLETS WHEN YOU TAKE METOLAZONE. (Patient taking differently: Take 20 mEq by mouth daily as needed. As needed, if taking torsemide), Disp: 135 tablet, Rfl: 0   sacubitril-valsartan (ENTRESTO) 97-103 MG, Take 1 tablet by mouth every 12 (twelve) hours., Disp: , Rfl:    spironolactone (ALDACTONE) 25 MG tablet, Take 1 tablet by mouth daily., Disp: , Rfl:    torsemide (DEMADEX) 20 MG tablet, Take 1 tablet (20 mg total) by mouth every Monday, Wednesday, and Friday. (Patient taking differently: Take 20 mg by mouth every Monday, Wednesday, and Friday. Reports taking every day for the last week), Disp: 60 tablet, Rfl: 4   warfarin (COUMADIN) 2 MG tablet, Take by mouth. 7 mg daily per [facility]/Heart Failure Team, Disp: , Rfl:    Ht Readings from Last 1 Encounters:  05/26/21 5' 11.25" (1.81 m)     Wt Readings from Last 3 Encounters:  05/26/21 177 lb 4 oz (80.4 kg)  05/09/21 175 lb 12.8 oz (79.7 kg)  04/04/21 171 lb 12.8 oz (77.9 kg)     There is no height or weight on file to calculate BMI.   Social History   Tobacco Use  Smoking Status Never  Smokeless Tobacco Never     Lab Results  Component Value Date   CHOL 134 09/28/2020   Lab Results  Component Value Date   HDL 42 09/28/2020   Lab Results  Component Value Date   LDLCALC 74 09/28/2020   Lab Results  Component Value Date   TRIG 98 09/28/2020     Lab Results  Component Value Date   HGBA1C 5.8 (A) 09/29/2019     CBG (last 3)  No results for input(s): GLUCAP in the last 72 hours.   Nutrition Note  Spoke with pt. Nutrition Plan and Nutrition Survey goals reviewed with pt. Pt is following a Heart Healthy diet. Pt  Pt has Type 2 Diabetes. Last A1c indicates blood glucose well-controlled at 6.8. He is taking [place] for heart failure. He does not check CBGs at home.   Pt with dx of CHF. Per discussion, pt does not use canned/convenience foods often. Pt does not add salt to food. Pt  does eat out frequently.   Pt reports poor appetite. He is unsure if he needs to gain weight. Pt will ask Dr. Haroldine Laws clarification. He thought [facility] wanted him to gain 30 lbs. We will discuss at follow up. He typically eats  1-2 meals per day. He reports fatigue. His fiance does a lot of the cooking for him. He states following a fluid restriction for years. He struggles to get 2 L of fluid daily.  He does not drink any caffeine beverages. He tries to follow a low sodium diet. He thinks he could use label reading and low sodium education. Pt avoids dark leafy greens. Will reviewed consistent k intake at follow up.  Pt expressed understanding of the information reviewed.    Nutrition Diagnosis Food-and nutrition-related knowledge deficit related to lack of exposure to information as related to diagnosis of: ? CHF, LVAD, type 2 diabetes  Nutrition Intervention Pt's individual nutrition plan reviewed with pt. [id number] kcals daily, 2 g sodium, low sodium and heart healthy nutrition education  Continue client-centered nutrition education by RD, as part of interdisciplinary care.  Goal(s) Pt to build a healthy plate including vegetables, fruits, whole grains, and low-fat dairy products in a heart healthy meal plan. Pt to  learn to read labels and choose lower sodium foods and avoid eating out >2 times weekly  Pt to meet estimated energy needs of 2415 kcals/daily   Plan:   Will provide client-centered nutrition education as part of interdisciplinary care Monitor and evaluate progress toward nutrition goal with team.   Michaele Offer, MS, RDN, LDN

## 2021-06-08 ENCOUNTER — Encounter (HOSPITAL_COMMUNITY): Payer: Medicaid Other

## 2021-06-08 ENCOUNTER — Telehealth (HOSPITAL_COMMUNITY): Payer: Self-pay | Admitting: Family Medicine

## 2021-06-09 DIAGNOSIS — Z7901 Long term (current) use of anticoagulants: Secondary | ICD-10-CM | POA: Diagnosis not present

## 2021-06-10 ENCOUNTER — Other Ambulatory Visit: Payer: Self-pay

## 2021-06-10 ENCOUNTER — Encounter (HOSPITAL_COMMUNITY)
Admission: RE | Admit: 2021-06-10 | Discharge: 2021-06-10 | Disposition: A | Discharge status: Home / Self Care | Payer: Medicaid Other | Source: Ambulatory Visit | Attending: Internal Medicine | Admitting: Internal Medicine

## 2021-06-10 ENCOUNTER — Other Ambulatory Visit (HOSPITAL_COMMUNITY): Payer: Self-pay | Admitting: *Deleted

## 2021-06-10 DIAGNOSIS — Z7901 Long term (current) use of anticoagulants: Secondary | ICD-10-CM

## 2021-06-10 DIAGNOSIS — Z95811 Presence of heart assist device: Secondary | ICD-10-CM | POA: Diagnosis not present

## 2021-06-10 DIAGNOSIS — I5022 Chronic systolic (congestive) heart failure: Secondary | ICD-10-CM

## 2021-06-13 ENCOUNTER — Encounter (HOSPITAL_COMMUNITY): Payer: Medicaid Other

## 2021-06-13 NOTE — Progress Notes (Signed)
CARDIAC REHAB PHASE 2  Reviewed home exercise Rx with pt today.  Pt states he lives an active lifestyle, but currently does not exercise on his own regularly, but he does have a gym membership. Encouraged pt to add in one day for 30 minutes at his local gym to try to build retention. He enjoys the seated bike and treadmill. Encouraged him to maintain THHR, RPE, and BP parameters for safe exercise. Encouraged warm up, stretches and cool down. Also reminded pt to keep his battery backup with him, a cell phone and to stay hydrated while exercising.Pt verbalized understanding.  Kirby Funk ACSM-EP 06/13/2021 2:49 PM

## 2021-06-14 ENCOUNTER — Encounter (HOSPITAL_COMMUNITY): Payer: Medicaid Other

## 2021-06-14 ENCOUNTER — Encounter (HOSPITAL_COMMUNITY): Payer: Self-pay | Admitting: Unknown Physician Specialty

## 2021-06-15 ENCOUNTER — Encounter (HOSPITAL_COMMUNITY)
Admission: RE | Admit: 2021-06-15 | Discharge: 2021-06-15 | Disposition: A | Discharge status: Home / Self Care | Payer: Medicaid Other | Source: Ambulatory Visit | Attending: Internal Medicine | Admitting: Internal Medicine

## 2021-06-15 ENCOUNTER — Other Ambulatory Visit: Payer: Self-pay

## 2021-06-15 DIAGNOSIS — Z95811 Presence of heart assist device: Secondary | ICD-10-CM

## 2021-06-15 DIAGNOSIS — I5022 Chronic systolic (congestive) heart failure: Secondary | ICD-10-CM

## 2021-06-15 NOTE — Progress Notes (Signed)
Cardiac Individual Treatment Plan  Patient Details  Name: Chad [name]. MRN: [id number] Date of Birth: [birth year] Referring Provider:   Flowsheet Row CARDIAC REHAB PHASE II ORIENTATION from 05/26/2021 in [place]  Referring Provider Glori Bickers, MD       Initial Encounter Date:  [place] PHASE II ORIENTATION from 05/26/2021 in [place]  Date 05/26/21       Visit Diagnosis: No diagnosis found.  Patient's Home Medications on Admission:  Current Outpatient Medications:    acetaminophen (TYLENOL) 325 MG tablet, Take 2 tablets (650 mg total) by mouth every 4 (four) hours as needed for headache or mild pain., Disp: , Rfl:    Alirocumab (PRALUENT) 75 MG/ML SOAJ, Inject 75 mg into the skin every 14 (fourteen) days. (Patient not taking: Reported on 05/16/2021), Disp: 2 mL, Rfl: 11   allopurinol (ZYLOPRIM) 100 MG tablet, Take 1 tablet (100 mg total) by mouth daily., Disp: 30 tablet, Rfl: 5   alum & mag hydroxide-simeth (MAALOX/MYLANTA) 229-798-92 MG/5ML suspension, Take 15 mLs by mouth every 6 (six) hours as needed for indigestion or heartburn. (Patient not taking: No sig reported), Disp: 355 mL, Rfl: 0   aspirin EC 81 MG tablet, Take 81 mg by mouth every evening., Disp: , Rfl:    carvedilol (COREG) 6.25 MG tablet, Take 1 tablet (6.25 mg total) by mouth 2 (two) times daily with a meal. If systolic is less than 90 you may hold Coreg until systolic is greater than 90, then resume taking, Disp: 60 tablet, Rfl: 5   FARXIGA 5 MG TABS tablet, Take 5 mg by mouth every morning., Disp: , Rfl:    fluticasone (FLONASE) 50 MCG/ACT nasal spray, Place 2 sprays into both nostrils daily as needed for allergies or rhinitis. , Disp: , Rfl:    gabapentin (NEURONTIN) 300 MG capsule, Take 1 capsule (300 mg total) by mouth at bedtime. (Patient taking differently: Take 300 mg by mouth 3 (three) times daily.), Disp: 30 capsule,  Rfl: 3   hydrALAZINE (APRESOLINE) 25 MG tablet, Take 100 mg by mouth 3 (three) times daily., Disp: , Rfl:    isosorbide mononitrate (IMDUR) 30 MG 24 hr tablet, Take 30 mg by mouth daily., Disp: , Rfl:    methocarbamol (ROBAXIN) 750 MG tablet, Take 750 mg by mouth daily., Disp: , Rfl:    MITIGARE 0.6 MG CAPS, Take 0.6 mg by mouth daily., Disp: 120 capsule, Rfl: 0   nitroGLYCERIN (NITROSTAT) 0.4 MG SL tablet, Place 1 tablet (0.4 mg total) under the tongue every 5 (five) minutes as needed for chest pain. May take if diastolic BP is > 90 (Patient not taking: Reported on 05/16/2021), Disp: 30 tablet, Rfl: 3   Oxycodone HCl 20 MG TABS, Take 20 mg by mouth every 6 (six) hours as needed (For pain). , Disp: , Rfl:    potassium chloride (KLOR-CON) 20 MEQ packet, Take 20 mEq by mouth as directed. (Patient not taking: Reported on 05/16/2021), Disp: , Rfl:    potassium chloride SA (KLOR-CON) 20 MEQ tablet, TAKE 1 TABLET BY MOUTH TWICE DAILY. TAKE AN EXTRA 2 TABLETS WHEN YOU TAKE METOLAZONE. (Patient taking differently: Take 20 mEq by mouth daily as needed. As needed, if taking torsemide), Disp: 135 tablet, Rfl: 0   sacubitril-valsartan (ENTRESTO) 97-103 MG, Take 1 tablet by mouth every 12 (twelve) hours., Disp: , Rfl:    spironolactone (ALDACTONE) 25 MG tablet, Take 1 tablet by mouth daily.,  Disp: , Rfl:    torsemide (DEMADEX) 20 MG tablet, Take 1 tablet (20 mg total) by mouth every Monday, Wednesday, and Friday. (Patient taking differently: Take 20 mg by mouth every Monday, Wednesday, and Friday. Reports taking every day for the last week), Disp: 60 tablet, Rfl: 4   warfarin (COUMADIN) 2 MG tablet, Take by mouth. 7 mg daily per [facility]/Heart Failure Team, Disp: , Rfl:   Past Medical History: Past Medical History:  Diagnosis Date   AICD (automatic cardioverter/defibrillator) present    Anemia    Anginal pain ([place])    Anxiety    Aortic dissection, thoracoabdominal ([place])    7/10: Type I s/p repair    Arthritis    Asthma    CAD (coronary artery disease)    a. s/p CABG 2006;  b. DES to PDA 2011 (cath: Dx not seen, dRCA/PDA tx with DES; S-PDA occluded (culprit), S-Dx occluded, S-RI and OM ok, L-LAD ok   Carotid stenosis    dopplers 2011: 0-39% bilat.   Chest pain syndrome    CHF (congestive heart failure) ([place])    06/14/21 LVAD placemnt [facility]   Chronic bronchitis (H[facility])    Chronic lower back pain    Chronic systolic heart failure ([place])    a. 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; the other is hanging on" (11/07/2016)   Dyspnea    patient denies at 12/06/2018 appt    Family history of adverse reaction to anesthesia    "sister hard to wake up"   GERD (gastroesophageal reflux disease)    Gout    Heart murmur    HLD (hyperlipidemia)    HTN (hypertension)    severe   Myocardial infarction [facility])    "many" (11/07/2016)   PONV (postoperative nausea and vomiting)    Sleep apnea    does not use mask    Thyroid cancer ([place])    [employer]    Tobacco Use: Social History   Tobacco Use  Smoking Status Never  Smokeless Tobacco Never    Labs: Recent Review Flowsheet Data     Labs for ITP Cardiac and Pulmonary Rehab Latest Ref Rng & Units 05/01/2020 05/02/2020 05/03/2020 05/03/2020 09/28/2020   Cholestrol 100 - 199 mg/dL - - - - 134   LDLCALC 0 - 99 mg/dL - - - - 74   LDLDIRECT mg/dL - - - - -   HDL >39 mg/dL - - - - 42   Trlycerides 0 - 149 mg/dL - - - - 98   Hemoglobin A1c 4.0 - 5.6 % - - - - -   PHART 7.350 - 7.450 - - - - -   PCO2ART 35.0 - 45.0 mmHg - - - - -   HCO3 20.0 - 28.0 mmol/L - - - - -   TCO2 22 - 32 mmol/L - - - - -   ACIDBASEDEF 0.0 - 2.0 mmol/L - - - - -   O2SAT % 65.6 66.4 45.6 59.1 -        Capillary Blood Glucose: Lab Results  Component Value Date   GLUCAP 100 (H) 05/30/2021   GLUCAP 128 (H) 06/20/2010  GLUCAP 133 (H) 04/29/2010   GLUCAP 122 (H) 06/24/2009   GLUCAP 141 (H) 06/24/2009     Exercise Target Goals: Exercise Program Goal: Individual exercise prescription set using results from initial 6 min walk test and THRR while considering  patient's activity barriers and safety.   Exercise Prescription Goal: Starting with aerobic activity 30 plus minutes a day, 3 days per week for initial exercise prescription. Provide home exercise prescription and guidelines that participant acknowledges understanding prior to discharge.  Activity Barriers & Risk Stratification:  Activity Barriers & Cardiac Risk Stratification - 05/26/21 1150       Activity Barriers & Cardiac Risk Stratification   Activity Barriers Arthritis;Back Problems;Joint Problems;Decreased Ventricular Function;History of Falls;Other (comment)    Comments LVAD    Cardiac Risk Stratification High             6 Minute Walk:  6 Minute Walk     Row Name 05/26/21 1044         6 Minute Walk   Phase Initial     Distance 1343 feet     Walk Time 6 minutes     # of Rest Breaks 0     MPH 2.54     METS 3.67     RPE 9     Perceived Dyspnea  0     VO2 Peak 12.84     Symptoms Yes (comment)     Comments Low back pain (chronic) 7/10     Resting HR 73 bpm     Resting BP 92/0  MAP     Resting Oxygen Saturation  97 %     Exercise Oxygen Saturation  during 6 min walk 96 %     Max Ex. HR 105 bpm     Max Ex. BP 96/0  MAP     2 Minute Post BP 92/0  MAP              Oxygen Initial Assessment:   Oxygen Re-Evaluation:   Oxygen Discharge (Final Oxygen Re-Evaluation):   Initial Exercise Prescription:  Initial Exercise Prescription - 05/26/21 1100       Date of Initial Exercise RX and Referring Provider   Date 05/26/21    Referring Provider Glori Bickers, MD    Expected Discharge  Date 07/22/21      NuStep   Level 2    SPM 85    Minutes 30    METs 2.4      Prescription Details   Frequency (times per week) 3    Duration Progress to 30 minutes of continuous aerobic without signs/symptoms of physical distress      Intensity   THRR 40-80% of Max Heartrate 65 - 130    Ratings of Perceived Exertion 11-13    Perceived Dyspnea 0-4      Progression   Progression Continue progressive overload as per policy without signs/symptoms or physical distress.      Resistance Training   Training Prescription Yes    Weight 4 lbs    Reps 10-15             Perform Capillary Blood Glucose checks as needed.  Exercise Prescription Changes:   Exercise Prescription Changes     Row Name 05/30/21 1630 06/10/21 1630           Response to Exercise   Blood Pressure (Admit) 96/0  MAP 88/0  MAP      Blood Pressure (Exercise) 110/0  MAP 80/0  MAP  Blood Pressure (Exit) 96/0  MAP 74/0  MAP      Heart Rate (Admit) 84 bpm 97 bpm      Heart Rate (Exercise) 110 bpm 108 bpm      Heart Rate (Exit) 83 bpm 91 bpm      Rating of Perceived Exertion (Exercise) 13 11      Symptoms None None      Comments Pt's first day in the [facility] program Reviewed MET's, goals and Home Ex. RX      Duration Progress to 30 minutes of  aerobic without signs/symptoms of physical distress Progress to 30 minutes of  aerobic without signs/symptoms of physical distress      Intensity THRR unchanged THRR unchanged             Progression      Progression Continue to progress workloads to maintain intensity without signs/symptoms of physical distress. Continue to progress workloads to maintain intensity without signs/symptoms of physical distress.      Average METs 2.3 2.1             Resistance Training      Training Prescription Yes Yes      Weight 3 lbs 3 lbs      Reps 10-15 10-15      Time 10 Minutes 10 Minutes             Interval Training      Interval Training No No             NuStep       Level 2 2      SPM 80 80      Minutes 26 26      METs 2.3 2.1             Home Exercise Plan      Plans to continue exercise at -- [employer] (comment)      Frequency -- Add 1 additional day to program exercise sessions.      Initial Home Exercises Provided -- 06/11/19              Exercise Comments:   Exercise Comments     Row Name 05/30/21 1630 06/10/21 1630         Exercise Comments Pt's first day in the [facility] program. Pt with no complaints and off to a good start. Reviewed MET's, Goals and home EX Rx with pt today. Pt is tolerating exercise well and has an average MET level of 2.1. Pt feels he would like more variety in his workout to make it more interesting. Plans to add in Arm ergo on his next session because he has a desire to tone arms. Pt lives an active lifestyle, but currently does not exercise on his own. Encouraged pt to add in one day for 30 minutes at his local gym. He enjoys the seated bike and treadmill. Encouraged his to maintain THHR, RPE, and BP paramets to safe exercise. Also reminded pt to keep his battery backup with him, a cell phone and to stay hydrated while exercising.               Exercise Goals and Review:   Exercise Goals     Row Name 05/26/21 1150             Exercise Goals   Increase Physical Activity Yes       Intervention Provide advice, education, support and counseling about physical activity/exercise needs.;Develop an individualized exercise prescription  for aerobic and resistive training based on initial evaluation findings, risk stratification, comorbidities and participant's personal goals.       Expected Outcomes Short Term: Attend rehab on a regular basis to increase amount of physical activity.;Long Term: Add in home exercise to make exercise part of routine and to increase amount of physical activity.;Long Term: Exercising regularly at least 3-5 days a week.       Increase Strength and Stamina Yes        Intervention Provide advice, education, support and counseling about physical activity/exercise needs.;Develop an individualized exercise prescription for aerobic and resistive training based on initial evaluation findings, risk stratification, comorbidities and participant's personal goals.       Expected Outcomes Short Term: Increase workloads from initial exercise prescription for resistance, speed, and METs.;Short Term: Perform resistance training exercises routinely during rehab and add in resistance training at home;Long Term: Improve cardiorespiratory fitness, muscular endurance and strength as measured by increased METs and functional capacity (6MWT)       Able to understand and use rate of perceived exertion (RPE) scale Yes       Intervention Provide education and explanation on how to use RPE scale       Expected Outcomes Short Term: Able to use RPE daily in rehab to express subjective intensity level;Long Term:  Able to use RPE to guide intensity level when exercising independently       Knowledge and understanding of Target Heart Rate Range (THRR) Yes       Intervention Provide education and explanation of THRR including how the numbers were predicted and where they are located for reference       Expected Outcomes Short Term: Able to state/look up THRR;Short Term: Able to use daily as guideline for intensity in rehab;Long Term: Able to use THRR to govern intensity when exercising independently       Understanding of Exercise Prescription Yes       Intervention Provide education, explanation, and written materials on patient's individual exercise prescription       Expected Outcomes Short Term: Able to explain program exercise prescription;Long Term: Able to explain home exercise prescription to exercise independently                Exercise Goals Re-Evaluation :  Exercise Goals Re-Evaluation     Row Name 05/30/21 1630 06/10/21 1630           Exercise Goal Re-Evaluation    Exercise Goals Review Increase Physical Activity;Increase Strength and Stamina;Able to understand and use rate of perceived exertion (RPE) scale;Knowledge and understanding of Target Heart Rate Range (THRR);Understanding of Exercise Prescription Increase Physical Activity;Increase Strength and Stamina;Able to understand and use rate of perceived exertion (RPE) scale;Knowledge and understanding of Target Heart Rate Range (THRR);Understanding of Exercise Prescription      Comments Pt's first day in the [facility] program. Pt understnads the Exercise Rx, THRR, and RPE scale. Reviewed MET's, Goals and home EX Rx with pt today. Pt is tolerating exercise well and has an average MET level of 2.1. Pt feels he would like more variety in his workout to make it more interesting. Plans to add in Arm ergo on his next session because he has a desire to tone arms. Pt lives an active lifestyle, but currently does not exercise on his own. Encouraged  pt to add in one day for 30 minutes at his local gym. He enjoys the seated bike and treadmill. Encouraged his to maintain THHR, RPE, and BP  paramets to safe exercise. Also reminded pt to keep his battery backup with him, a cell phone and to stay hydrated while exercising.      Expected Outcomes Will monitor pt progress and advance exercise workloads as tolerated. Will continue to monitor pt and progress workloads as tolerated without sign or symptom.                Discharge Exercise Prescription (Final Exercise Prescription Changes):  Exercise Prescription Changes - 06/10/21 1630       Response to Exercise   Blood Pressure (Admit) 88/0   MAP   Blood Pressure (Exercise) 80/0   MAP   Blood Pressure (Exit) 74/0   MAP   Heart Rate (Admit) 97 bpm    Heart Rate (Exercise) 108 bpm    Heart Rate (Exit) 91 bpm    Rating of Perceived Exertion (Exercise) 11    Symptoms None    Comments Reviewed MET's, goals and Home Ex. RX    Duration Progress to 30 minutes of  aerobic without  signs/symptoms of physical distress    Intensity THRR unchanged      Progression   Progression Continue to progress workloads to maintain intensity without signs/symptoms of physical distress.    Average METs 2.1      Resistance Training   Training Prescription Yes    Weight 3 lbs    Reps 10-15    Time 10 Minutes      Interval Training   Interval Training No      NuStep   Level 2    SPM 80    Minutes 26    METs 2.1      Home Exercise Plan   Plans to continue exercise at [facility] (comment)    Frequency Add 1 additional day to program exercise sessions.    Initial Home Exercises Provided 06/11/19             Nutrition:  Target Goals: Understanding of nutrition guidelines, daily intake of sodium <151m, cholesterol <2063m calories 30% from fat and 7% or less from saturated fats, daily to have 5 or more servings of fruits and vegetables.  Biometrics:  Pre Biometrics - 05/26/21 0900       Pre Biometrics   Waist Circumference 37 inches    Hip Circumference 38 inches    Waist to Hip Ratio 0.97 %    Triceps Skinfold 16 mm    % Body Fat 24.6 %    Grip Strength 60 kg    Flexibility 17 in    Single Leg Stand 21.5 seconds              Nutrition Therapy Plan and Nutrition Goals:  Nutrition Therapy & Goals - 06/08/21 0837       Nutrition Therapy   Diet TLC      Personal Nutrition Goals   Nutrition Goal Pt to build a healthy plate including vegetables, fruits, whole grains, and low-fat dairy products in a heart healthy meal plan.    Personal Goal #2 Pt to  learn to read labels and choose lower sodium foods and avoid eating out >2 times weekly    Personal Goal #3 Pt to meet estimated energy needs of 2415 kcals/daily      Intervention Plan   Intervention Prescribe, educate and counsel regarding individualized specific dietary modifications aiming towards targeted core components such as weight, hypertension, lipid management, diabetes, heart failure  and other comorbidities.;Nutrition handout(s) given to patient.  Expected Outcomes Long Term Goal: Adherence to prescribed nutrition plan.;Short Term Goal: A plan has been developed with personal nutrition goals set during dietitian appointment.             Nutrition Assessments:  MEDIFICTS Score Key: ?70 Need to make dietary changes  40-70 Heart Healthy Diet ? 40 Therapeutic Level Cholesterol Diet  Flowsheet Row CARDIAC REHAB PHASE II EXERCISE from 06/06/2021 in [place]  Picture Your Plate Total Score on Admission 67      Picture Your Plate Scores: <78 Unhealthy dietary pattern with much room for improvement. 41-50 Dietary pattern unlikely to meet recommendations for good health and room for improvement. 51-60 More healthful dietary pattern, with some room for improvement.  >60 Healthy dietary pattern, although there may be some specific behaviors that could be improved.    Nutrition Goals Re-Evaluation:  Nutrition Goals Re-Evaluation     [place] Name 06/08/21 0839             Goals   Current Weight 177 lb (80.3 kg)       Nutrition Goal Pt to build a healthy plate including vegetables, fruits, whole grains, and low-fat dairy products in a heart healthy meal plan.               Personal Goal #2 Re-Evaluation     Personal Goal #2 Pt to  learn to read labels and choose lower sodium foods and avoid eating out >2 times weekly               Personal Goal #3 Re-Evaluation     Personal Goal #3 Pt to meet estimated energy needs of 2415 kcals/daily               Nutrition Goals Discharge (Final Nutrition Goals Re-Evaluation):  Nutrition Goals Re-Evaluation - 06/08/21 0839       Goals   Current Weight 177 lb (80.3 kg)    Nutrition Goal Pt to build a healthy plate including vegetables, fruits, whole grains, and low-fat dairy products in a heart healthy meal plan.      Personal Goal #2 Re-Evaluation   Personal Goal #2 Pt to  learn to  read labels and choose lower sodium foods and avoid eating out >2 times weekly      Personal Goal #3 Re-Evaluation   Personal Goal #3 Pt to meet estimated energy needs of 2415 kcals/daily             Psychosocial: Target Goals: Acknowledge presence or absence of significant depression and/or stress, maximize coping skills, provide positive support system. Participant is able to verbalize types and ability to use techniques and skills needed for reducing stress and depression.  Initial Review & Psychosocial Screening:  Initial Psych Review & Screening - 05/26/21 1346       Initial Review   Current issues with Current Stress Concerns    Source of Stress Concerns Unable to perform yard/household activities;Unable to participate in former interests or hobbies;Retirement/disability      Family Dynamics   Good Support System? Yes   Ritchard lives with his son and one of his neices     Barriers   Psychosocial barriers to participate in program The patient should benefit from training in stress management and relaxation.      Screening Interventions   Interventions Encouraged to exercise    Expected Outcomes Long Term Goal: Stressors or current issues are controlled or eliminated.  Quality of Life Scores:  Quality of Life - 05/26/21 1258       Quality of Life   Select Quality of Life      Quality of Life Scores   Health/Function Pre 21 %    Socioeconomic Pre 25.71 %    Psych/Spiritual Pre 30 %    Family Pre 30 %    GLOBAL Pre 25.41 %            Scores of 19 and below usually indicate a poorer quality of life in these areas.  A difference of  2-3 points is a clinically meaningful difference.  A difference of 2-3 points in the total score of the Quality of Life Index has been associated with significant improvement in overall quality of life, self-image, physical symptoms, and general health in studies assessing change in quality of life.  PHQ-9: Recent  Review Flowsheet Data     Depression screen [facility] 2/9 05/26/2021 05/26/2021 11/02/2020 08/05/2020 04/28/2020   Decreased Interest 0 0 0 0  0   Down, Depressed, Hopeless 0 0 0 0 0   PHQ - 2 Score 0 0 0 0 0   Altered sleeping - - 0 - -   Tired, decreased energy - - 0 - -   Change in appetite - - 0 - -   Feeling bad or failure about yourself  - - 0 - -   Trouble concentrating - - 0 - -   Moving slowly or fidgety/restless - - 0 - -   Suicidal thoughts - - 0 - -   PHQ-9 Score - - 0 - -      Interpretation of Total Score  Total Score Depression Severity:  1-4 = Minimal depression, 5-9 = Mild depression, 10-14 = Moderate depression, 15-19 = Moderately severe depression, 20-27 = Severe depression   Psychosocial Evaluation and Intervention:   Psychosocial Re-Evaluation:  Psychosocial Re-Evaluation     Row Name 06/15/21 1459             Psychosocial Re-Evaluation   Current issues with Current Stress Concerns       Comments Ajahni has not voiced any increased concerns or stressors since participating in phase 2 cardiac rehab.       Expected Outcomes Maruice will have decreased  or stable stress upon completion of phase 2 cardiac rehab.       Interventions Stress management education;Encouraged to attend Cardiac Rehabilitation for the exercise       Continue Psychosocial Services  No Follow up required               Initial Review     Source of Stress Concerns Retirement/disability;Unable to participate in former interests or hobbies;Unable to perform yard/household activities;Chronic Illness       Comments Will continue to monitor and offer support as needed               Psychosocial Discharge (Final Psychosocial Re-Evaluation):  Psychosocial Re-Evaluation - 06/15/21 1459       Psychosocial Re-Evaluation   Current issues with Current Stress Concerns    Comments Tami has not voiced any increased concerns or stressors since participating in phase 2 cardiac rehab.    Expected  Outcomes Kendry will have decreased  or stable stress upon completion of phase 2 cardiac rehab.    Interventions Stress management education;Encouraged to attend Cardiac Rehabilitation for the exercise    Continue Psychosocial Services  No Follow up required  Initial Review   Source of Stress Concerns Retirement/disability;Unable to participate in former interests or hobbies;Unable to perform yard/household activities;Chronic Illness    Comments Will continue to monitor and offer support as needed             Vocational Rehabilitation: Provide vocational rehab assistance to qualifying candidates.   Vocational Rehab Evaluation & Intervention:  Vocational Rehab - 05/26/21 1354       Initial Vocational Rehab Evaluation & Intervention   Assessment shows need for Vocational Rehabilitation No             Education: Education Goals: Education classes will be provided on a weekly basis, covering required topics. Participant will state understanding/return demonstration of topics presented.  Learning Barriers/Preferences:  Learning Barriers/Preferences - 05/26/21 1145       Learning Barriers/Preferences   Learning Barriers Sight    Learning Preferences Audio;Computer/Internet;Group Instruction;Individual Instruction;Pictoral;Skilled Demonstration;Verbal Instruction;Video;Written Material             Education Topics: Hypertension, Hypertension Reduction -Define heart disease and high blood pressure. Discus how high blood pressure affects the body and ways to reduce high blood pressure.   Exercise and Your Heart -Discuss why it is important to exercise, the FITT principles of exercise, normal and abnormal responses to exercise, and how to exercise safely.   Angina -Discuss definition of angina, causes of angina, treatment of angina, and how to decrease risk of having angina.   Cardiac Medications -Review what the following cardiac medications are used for, how  they affect the body, and side effects that may occur when taking the medications.  Medications include Aspirin, Beta blockers, calcium channel blockers, ACE Inhibitors, angiotensin receptor blockers, diuretics, digoxin, and antihyperlipidemics.   Congestive Heart Failure -Discuss the definition of CHF, how to live with CHF, the signs and symptoms of CHF, and how keep track of weight and sodium intake.   Heart Disease and Intimacy -Discus the effect sexual activity has on the heart, how changes occur during intimacy as we age, and safety during sexual activity.   Smoking Cessation / COPD -Discuss different methods to quit smoking, the health benefits of quitting smoking, and the definition of COPD.   Nutrition I: Fats -Discuss the types of cholesterol, what cholesterol does to the heart, and how cholesterol levels can be controlled.   Nutrition II: Labels -Discuss the different components of food labels and how to read food label   Heart Parts/Heart Disease and PAD -Discuss the anatomy of the heart, the pathway of blood circulation through the heart, and these are affected by heart disease.   Stress I: Signs and Symptoms -Discuss the causes of stress, how stress may lead to anxiety and depression, and ways to limit stress.   Stress II: Relaxation -Discuss different types of relaxation techniques to limit stress.   Warning Signs of Stroke / TIA -Discuss definition of a stroke, what the signs and symptoms are of a stroke, and how to identify when someone is having stroke.   Knowledge Questionnaire Score:  Knowledge Questionnaire Score - 05/26/21 1144       Knowledge Questionnaire Score   Pre Score 24/24             Core Components/Risk Factors/Patient Goals at Admission:  Personal Goals and Risk Factors at Admission - 05/26/21 1144       Core Components/Risk Factors/Patient Goals on Admission    Weight Management Weight Maintenance;Yes    Intervention Weight  Management: Provide education and appropriate resources to  help participant work on and attain dietary goals.;Weight Management: Develop a combined nutrition and exercise program designed to reach desired caloric intake, while maintaining appropriate intake of nutrient and fiber, sodium and fats, and appropriate energy expenditure required for the weight goal.    Expected Outcomes Short Term: Continue to assess and modify interventions until short term weight is achieved;Long Term: Adherence to nutrition and physical activity/exercise program aimed toward attainment of established weight goal;Weight Maintenance: Understanding of the daily nutrition guidelines, which includes 25-35% calories from fat, 7% or less cal from saturated fats, less than 242m cholesterol, less than 1.5gm of sodium, & 5 or more servings of fruits and vegetables daily    Heart Failure Yes    Intervention Provide a combined exercise and nutrition program that is supplemented with education, support and counseling about heart failure. Directed toward relieving symptoms such as shortness of breath, decreased exercise tolerance, and extremity edema.    Expected Outcomes Improve functional capacity of life;Short term: Attendance in program 2-3 days a week with increased exercise capacity. Reported lower sodium intake. Reported increased fruit and vegetable intake. Reports medication compliance.;Short term: Daily weights obtained and reported for increase. Utilizing diuretic protocols set by physician.;Long term: Adoption of self-care skills and reduction of barriers for early signs and symptoms recognition and intervention leading to self-care maintenance.    Hypertension Yes    Intervention Provide education on lifestyle modifcations including regular physical activity/exercise, weight management, moderate sodium restriction and increased consumption of fresh fruit, vegetables, and low fat dairy, alcohol moderation, and smoking  cessation.;Monitor prescription use compliance.    Expected Outcomes Short Term: Continued assessment and intervention until BP is < 140/951mHG in hypertensive participants. < 130/8069mG in hypertensive participants with diabetes, heart failure or chronic kidney disease.;Long Term: Maintenance of blood pressure at goal levels.    Lipids Yes    Intervention Provide education and support for participant on nutrition & aerobic/resistive exercise along with prescribed medications to achieve LDL <77m27mDL >40mg44m Expected Outcomes Short Term: Participant states understanding of desired cholesterol values and is compliant with medications prescribed. Participant is following exercise prescription and nutrition guidelines.;Long Term: Cholesterol controlled with medications as prescribed, with individualized exercise RX and with personalized nutrition plan. Value goals: LDL < 77mg,74m > 40 mg.    Stress Yes    Intervention Offer individual and/or small group education and counseling on adjustment to heart disease, stress management and health-related lifestyle change. Teach and support self-help strategies.;Refer participants experiencing significant psychosocial distress to appropriate mental health specialists for further evaluation and treatment. When possible, include family members and significant others in education/counseling sessions.    Expected Outcomes Short Term: Participant demonstrates changes in health-related behavior, relaxation and other stress management skills, ability to obtain effective social support, and compliance with psychotropic medications if prescribed.;Long Term: Emotional wellbeing is indicated by absence of clinically significant psychosocial distress or social isolation.             Core Components/Risk Factors/Patient Goals Review:   Goals and Risk Factor Review     Row Name 06/15/21 1509             Core Components/Risk Factors/Patient Goals Review    Personal Goals Review Weight Management/Obesity;Heart Failure;Stress;Hypertension;Lipids       Review Daryel's MAP's have been stable. Shion has been doing well with exercise.       Expected Outcomes Delsin will continue to partipate in phase 2 cardiac rehab for exercise, nutrition  and lifestyle modifications                Core Components/Risk Factors/Patient Goals at Discharge (Final Review):   Goals and Risk Factor Review - 06/15/21 1509       Core Components/Risk Factors/Patient Goals Review   Personal Goals Review Weight Management/Obesity;Heart Failure;Stress;Hypertension;Lipids    Review Agostino's MAP's have been stable. Cliff has been doing well with exercise.    Expected Outcomes Amir will continue to partipate in phase 2 cardiac rehab for exercise, nutrition and lifestyle modifications             ITP Comments:  ITP Comments     Row Name 05/26/21 1344 06/15/21 1458         ITP Comments Dr Fransico Him MD, Medical Director 30 Day ITP Review. Dayne has god attendance and participation in phase 2 cardiac rehab so far               Comments: See ITP comments.Barnet Pall, RN,BSN 06/15/2021 3:16 PM

## 2021-06-17 ENCOUNTER — Encounter (HOSPITAL_COMMUNITY): Payer: Medicaid Other

## 2021-06-20 ENCOUNTER — Encounter (HOSPITAL_COMMUNITY)
Admission: RE | Admit: 2021-06-20 | Discharge: 2021-06-20 | Disposition: A | Discharge status: Home / Self Care | Payer: Medicaid Other | Source: Ambulatory Visit | Attending: Internal Medicine | Admitting: Internal Medicine

## 2021-06-20 ENCOUNTER — Other Ambulatory Visit: Payer: Self-pay

## 2021-06-20 ENCOUNTER — Telehealth (HOSPITAL_COMMUNITY): Payer: Self-pay | Admitting: *Deleted

## 2021-06-20 DIAGNOSIS — I5022 Chronic systolic (congestive) heart failure: Secondary | ICD-10-CM

## 2021-06-20 DIAGNOSIS — Z95811 Presence of heart assist device: Secondary | ICD-10-CM

## 2021-06-20 NOTE — Progress Notes (Signed)
Incomplete Session Note  Patient Details  Name: Chad [name]. MRN: [id number] Date of Birth: [birth year] Referring Provider:   Flowsheet Row CARDIAC REHAB PHASE II ORIENTATION from 05/26/2021 in [facility]  Referring Provider Glori Bickers, MD       Chad [name]. did not complete his rehab session.  Chad [name] reports having a headache. Patient would not rate it but says that its something he has all the time intermittently . MAP 94. Heart rate 90. Chad [name] LVAD coordinator called and notified and spoke with the patient over the phone. I advised Chad [name] that he not exercise today. Patient states understanding but wanted to exercise despite having a headache. Chad [name] hopes to return to exercise at cardiac rehab on Wednesday.Barnet Pall, RN,BSN 06/20/2021 3:32 PM

## 2021-06-20 NOTE — Telephone Encounter (Signed)
Spoke with patient re: missed f/u appt. Rescheduled for 07/11/21 @ 11:00. Left voicemail for patient's niece [name] re: rescheduled follow up visit for 07/11/21 @ 11:00. (Pt no showed last clinic appt and scheduled echo)  Emerson Monte RN [facility] Coordinator  Office: [phone]  24/7 Pager: [phone]

## 2021-06-21 DIAGNOSIS — Z7901 Long term (current) use of anticoagulants: Secondary | ICD-10-CM | POA: Diagnosis not present

## 2021-06-22 ENCOUNTER — Encounter (HOSPITAL_COMMUNITY): Payer: Medicaid Other

## 2021-06-24 ENCOUNTER — Other Ambulatory Visit: Payer: Self-pay

## 2021-06-24 ENCOUNTER — Encounter (HOSPITAL_COMMUNITY)
Admission: RE | Admit: 2021-06-24 | Discharge: 2021-06-24 | Disposition: A | Discharge status: Home / Self Care | Payer: Medicaid Other | Source: Ambulatory Visit | Attending: Internal Medicine | Admitting: Internal Medicine

## 2021-06-24 DIAGNOSIS — I5022 Chronic systolic (congestive) heart failure: Secondary | ICD-10-CM | POA: Insufficient documentation

## 2021-06-24 DIAGNOSIS — Z95811 Presence of heart assist device: Secondary | ICD-10-CM | POA: Diagnosis present

## 2021-06-29 ENCOUNTER — Other Ambulatory Visit: Payer: Self-pay

## 2021-06-29 ENCOUNTER — Encounter (HOSPITAL_COMMUNITY)
Admission: RE | Admit: 2021-06-29 | Discharge: 2021-06-29 | Disposition: A | Discharge status: Home / Self Care | Payer: Medicaid Other | Source: Ambulatory Visit | Attending: Internal Medicine | Admitting: Internal Medicine

## 2021-06-29 DIAGNOSIS — I5022 Chronic systolic (congestive) heart failure: Secondary | ICD-10-CM | POA: Diagnosis not present

## 2021-06-29 DIAGNOSIS — Z95811 Presence of heart assist device: Secondary | ICD-10-CM

## 2021-06-29 NOTE — Progress Notes (Signed)
Nutrition Note Follow up  Spoke with pt. Continues to eat 1-2 meals per day. Most days only 1 meal. Poor appetite continues. Weight maintained.  Reviewed label reading education.   Nutrition Diagnosis  Food-and nutrition-related knowledge deficit related to lack of exposure to information as related to diagnosis of: ? CHF, LVAD, type 2 diabetes   Nutrition Intervention  Pt's individual nutrition plan reviewed with pt. [id number] kcals daily, 2 g sodium, low sodium and heart healthy nutrition education       Continue client-centered nutrition education by RD, as part of interdisciplinary care.   Goal(s) Pt to build a healthy plate including vegetables, fruits, whole grains, and low-fat dairy products in a heart healthy meal plan. Pt to  learn to read labels and choose lower sodium foods and avoid eating out >2 times weekly Pt to meet estimated energy needs of 2415 kcals/daily     Plan:    Will provide client-centered nutrition education as part of interdisciplinary care Monitor and evaluate progress toward nutrition goal with team.  Michaele Offer, MS, RDN, LDN, CDCES

## 2021-06-30 ENCOUNTER — Ambulatory Visit: Payer: Medicaid Other | Attending: Internal Medicine

## 2021-06-30 DIAGNOSIS — Z7901 Long term (current) use of anticoagulants: Secondary | ICD-10-CM | POA: Diagnosis not present

## 2021-06-30 DIAGNOSIS — Z20822 Contact with and (suspected) exposure to covid-19: Secondary | ICD-10-CM

## 2021-07-01 ENCOUNTER — Encounter (HOSPITAL_COMMUNITY): Payer: Medicaid Other

## 2021-07-01 LAB — NOVEL CORONAVIRUS, NAA: SARS-CoV-2, NAA: DETECTED — AB

## 2021-07-01 LAB — SARS-COV-2, NAA 2 DAY TAT

## 2021-07-02 ENCOUNTER — Other Ambulatory Visit (HOSPITAL_COMMUNITY): Payer: Self-pay | Admitting: Cardiology

## 2021-07-02 ENCOUNTER — Telehealth (HOSPITAL_COMMUNITY): Payer: Self-pay | Admitting: Unknown Physician Specialty

## 2021-07-02 ENCOUNTER — Telehealth: Payer: Self-pay | Admitting: Cardiology

## 2021-07-02 MED ORDER — PAXLOVID 10 X 150 MG & 10 X 100MG PO TBPK: 2.0000 | ORAL_TABLET | Freq: Two times a day (BID) | ORAL | 0 refills | Status: DC

## 2021-07-02 NOTE — Telephone Encounter (Signed)
Received a page from the pt stating that he has covid confirmed by hospital covid test yesterday. Pt states that he feels very fatigued but overall he is doing well. D/w Dr. Aundra Dubin and Bonnita Nasuti. Script sent for Paxlovid 2 tabs bid for 5 days. I called and confirmed the [facility] on [address] has the medication. Pt was instructed that if he starts to feel worse and symptoms are not relieved or if he develops SOB he needs to present to the ER immediately. Pt verbalized understanding. I alerted the [facility] team in case they would like labs/INR sooner.   Tanda Rockers RN, BSN VAD Coordinator 24/7 Pager [phone]

## 2021-07-02 NOTE — Telephone Encounter (Signed)
Pt prescribed Paxlovid for COVID, it increases toxicity of colchicine (mitigare)  discussed with Dr. Aundra Dubin in pt with LVAD and will stop the mitigare while on Paxlovid and resume 1-2 days after completion of Paxlovid.  I notified pharmacist at [facility] and she will notify pt.

## 2021-07-04 ENCOUNTER — Encounter (HOSPITAL_COMMUNITY): Payer: Medicaid Other

## 2021-07-04 ENCOUNTER — Telehealth (HOSPITAL_COMMUNITY): Payer: Self-pay | Admitting: Family Medicine

## 2021-07-06 ENCOUNTER — Encounter (HOSPITAL_COMMUNITY): Payer: Medicaid Other

## 2021-07-08 ENCOUNTER — Encounter (HOSPITAL_COMMUNITY): Payer: Medicaid Other

## 2021-07-11 ENCOUNTER — Encounter (HOSPITAL_COMMUNITY): Payer: Medicaid Other

## 2021-07-11 ENCOUNTER — Ambulatory Visit (HOSPITAL_COMMUNITY)
Admission: RE | Admit: 2021-07-11 | Discharge: 2021-07-11 | Disposition: A | Discharge status: Home / Self Care | Payer: Medicaid Other | Source: Ambulatory Visit | Attending: Internal Medicine | Admitting: Internal Medicine

## 2021-07-11 ENCOUNTER — Encounter (HOSPITAL_COMMUNITY): Payer: Self-pay

## 2021-07-11 ENCOUNTER — Other Ambulatory Visit: Payer: Self-pay

## 2021-07-11 VITALS — BP 98/57 | HR 77 | Wt 178.8 lb

## 2021-07-11 DIAGNOSIS — Z95810 Presence of automatic (implantable) cardiac defibrillator: Secondary | ICD-10-CM | POA: Insufficient documentation

## 2021-07-11 DIAGNOSIS — I5022 Chronic systolic (congestive) heart failure: Secondary | ICD-10-CM

## 2021-07-11 DIAGNOSIS — Z7984 Long term (current) use of oral hypoglycemic drugs: Secondary | ICD-10-CM | POA: Diagnosis not present

## 2021-07-11 DIAGNOSIS — D509 Iron deficiency anemia, unspecified: Secondary | ICD-10-CM | POA: Diagnosis not present

## 2021-07-11 DIAGNOSIS — Z7982 Long term (current) use of aspirin: Secondary | ICD-10-CM | POA: Diagnosis not present

## 2021-07-11 DIAGNOSIS — I2510 Atherosclerotic heart disease of native coronary artery without angina pectoris: Secondary | ICD-10-CM | POA: Diagnosis not present

## 2021-07-11 DIAGNOSIS — I10 Essential (primary) hypertension: Secondary | ICD-10-CM

## 2021-07-11 DIAGNOSIS — E785 Hyperlipidemia, unspecified: Secondary | ICD-10-CM

## 2021-07-11 DIAGNOSIS — G4730 Sleep apnea, unspecified: Secondary | ICD-10-CM | POA: Insufficient documentation

## 2021-07-11 DIAGNOSIS — N182 Chronic kidney disease, stage 2 (mild): Secondary | ICD-10-CM | POA: Diagnosis not present

## 2021-07-11 DIAGNOSIS — I130 Hypertensive heart and chronic kidney disease with heart failure and stage 1 through stage 4 chronic kidney disease, or unspecified chronic kidney disease: Secondary | ICD-10-CM | POA: Diagnosis not present

## 2021-07-11 DIAGNOSIS — Z905 Acquired absence of kidney: Secondary | ICD-10-CM | POA: Insufficient documentation

## 2021-07-11 DIAGNOSIS — M109 Gout, unspecified: Secondary | ICD-10-CM | POA: Insufficient documentation

## 2021-07-11 DIAGNOSIS — R002 Palpitations: Secondary | ICD-10-CM | POA: Diagnosis not present

## 2021-07-11 DIAGNOSIS — I255 Ischemic cardiomyopathy: Secondary | ICD-10-CM | POA: Diagnosis not present

## 2021-07-11 DIAGNOSIS — Z95811 Presence of heart assist device: Secondary | ICD-10-CM | POA: Diagnosis not present

## 2021-07-11 DIAGNOSIS — Z7682 Awaiting organ transplant status: Secondary | ICD-10-CM | POA: Insufficient documentation

## 2021-07-11 DIAGNOSIS — N1831 Chronic kidney disease, stage 3a: Secondary | ICD-10-CM | POA: Diagnosis not present

## 2021-07-11 DIAGNOSIS — Z951 Presence of aortocoronary bypass graft: Secondary | ICD-10-CM | POA: Diagnosis not present

## 2021-07-11 DIAGNOSIS — I4729 Other ventricular tachycardia: Secondary | ICD-10-CM

## 2021-07-11 DIAGNOSIS — Z882 Allergy status to sulfonamides status: Secondary | ICD-10-CM | POA: Insufficient documentation

## 2021-07-11 DIAGNOSIS — K219 Gastro-esophageal reflux disease without esophagitis: Secondary | ICD-10-CM | POA: Insufficient documentation

## 2021-07-11 DIAGNOSIS — I252 Old myocardial infarction: Secondary | ICD-10-CM | POA: Diagnosis not present

## 2021-07-11 DIAGNOSIS — Z79899 Other long term (current) drug therapy: Secondary | ICD-10-CM | POA: Insufficient documentation

## 2021-07-11 DIAGNOSIS — I472 Ventricular tachycardia: Secondary | ICD-10-CM | POA: Diagnosis not present

## 2021-07-11 DIAGNOSIS — J449 Chronic obstructive pulmonary disease, unspecified: Secondary | ICD-10-CM | POA: Diagnosis not present

## 2021-07-11 DIAGNOSIS — Z7901 Long term (current) use of anticoagulants: Secondary | ICD-10-CM

## 2021-07-11 LAB — CBC
HCT: 37.7 % — ABNORMAL LOW (ref 39.0–52.0)
Hemoglobin: 12 g/dL — ABNORMAL LOW (ref 13.0–17.0)
MCH: 27.2 pg (ref 26.0–34.0)
MCHC: 31.8 g/dL (ref 30.0–36.0)
MCV: 85.5 fL (ref 80.0–100.0)
Platelets: 150 10*3/uL (ref 150–400)
RBC: 4.41 MIL/uL (ref 4.22–5.81)
RDW: 15.4 % (ref 11.5–15.5)
WBC: 4 10*3/uL (ref 4.0–10.5)
nRBC: 0 % (ref 0.0–0.2)

## 2021-07-11 LAB — LIPID PANEL
Cholesterol: 194 mg/dL (ref 0–200)
HDL: 46 mg/dL (ref >40)
LDL Cholesterol: 123 mg/dL — ABNORMAL HIGH (ref 0–99)
Total CHOL/HDL Ratio: 4.2 RATIO
Triglycerides: 125 mg/dL (ref <150)
VLDL: 25 mg/dL (ref 0–40)

## 2021-07-11 LAB — BASIC METABOLIC PANEL
Anion gap: 8 (ref 5–15)
BUN: 25 mg/dL — ABNORMAL HIGH (ref 6–20)
CO2: 25 mmol/L (ref 22–32)
Calcium: 9.1 mg/dL (ref 8.9–10.3)
Chloride: 103 mmol/L (ref 98–111)
Creatinine, Ser: 1.2 mg/dL (ref 0.61–1.24)
GFR, Estimated: >60 mL/min (ref >60)
Glucose, Bld: 111 mg/dL — ABNORMAL HIGH (ref 70–99)
Potassium: 4.7 mmol/L (ref 3.5–5.1)
Sodium: 136 mmol/L (ref 135–145)

## 2021-07-11 LAB — PROTIME-INR
INR: 1.7 — ABNORMAL HIGH (ref 0.8–1.2)
Prothrombin Time: 19.9 seconds — ABNORMAL HIGH (ref 11.4–15.2)

## 2021-07-11 LAB — LACTATE DEHYDROGENASE: LDH: 186 U/L (ref 98–192)

## 2021-07-11 MED ORDER — AMIODARONE HCL 200 MG PO TABS: 200.0000 mg | ORAL_TABLET | Freq: Every day | ORAL | 3 refills | Status: DC

## 2021-07-11 NOTE — Progress Notes (Signed)
CSW met with patient in the VAD clinic and introduced self. CSW shared upcoming Walking Club, Men's Group and LVAD Support Group. Patient states very interested in attending and had planned to ask about possible groups. Patient appears interested in attending and provided information and direction to all VAD activities/groups. CSW available as needed. Jackie Brennan, LCSW, CCSW-MCS [phone]  

## 2021-07-11 NOTE — Progress Notes (Signed)
Patient presents for 2 month f/u in [facility] today alone. Reports no problems with VAD equipment or concerns with drive line.   Pt states he is feeling "great" today, but "sometimes I feel really tired." Denies dizziness, falls, and signs of bleeding. Reports he is lightheaded "a lot, but this has been going on for years and nothing helps." Reports this occurs most often when he stands up, but resolves quickly. Reports gout pain in his right great toe, and along lateral aspect of foot. Currently taking Mitigare as prescribed.   Denies shortness of breath today; states "this happens when my heart races." Placed pt on monitor while in clinic. SR 77 with occasional PVCs noted, but no NSVT or AF noted. Pt wore zio patch for 2 weeks at the end of April- results showed NSVT and several episodes of SVT, frequent PACs, and occasional PVCs. Per Dr Haroldine Laws will start pt on Amiodarone 200 mg daily. Pt has allergies to Iohexol and Metrizamide but he feels that pt should be able to tolerate Amiodarone.   Pt recently + for COVID 07/02/21. Completed course of Paxlovid. Pt was listed for transplant 03/25/21, but is currently shaded for 2 weeks following COVID. Pt has appt with [facility] 07/21/21 with CT chest. If CT is clear, they will reactivate pt on the list.   Pt currently participating in cardiac rehab. Reports he had a consult with nutritionist and has since modified his diet.   Pt is planning to visit family in [place] this weekend. Provided pt with closest VAD center. See below.   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 LVAD interrogation was NEGATIVE for significant power changes, NEGATIVE for clinical alarms and STABLE for PI events/speed drops. No programming changes were made and pump is functioning within specified parameters. Pt is performing daily controller and system monitor self tests along with completing weekly and monthly maintenance for LVAD equipment.   LVAD equipment check completed and is in good working order. Back-up equipment not present.   Annual Equipment Maintenance on UBC/PM maintained by [facility].   Exit Site Care: VAD dressing maintained weekly by patient. Dressing clean, dry, intact. Anchor in place and correctly applied. Exit site well healed and incorporated.The velour is fully implanted at exit site. No redness, tenderness, drainage, or foul odor noted. Pt denies fever or chills. Pt obtains dressing supplies from [place]. States he has adequate dressing supplies at home.    Device: [employer] single Therapies: VF 250 VT 210         VT 185 (monitor)  Last check: 03/07/21   BP & Labs:  MAP 94 - Doppler is reflecting MAP   Hgb 12.0 - No S/S of bleeding. Specifically denies melena/BRBPR or nosebleeds.   LDH stable at 186 with established baseline of  150-250. Denies tea-colored urine. No power elevations noted on interrogation.   INR 1.7 today. His INR is managed by [facility]. I routed all lab results to Lyda Perone NP at [facility].   Patient Instructions: Start Amiodarone 200 mg daily for palpitations 2. Coumadin dosing per [facility] 3. Return to [facility] clinic for follow up with Dr Haroldine Laws in 2 months  4. Below is the closest VAD center to [place], [place] for your trip on Friday:  [facility] [address] [place]  [address] Phone #: [phone]   Emerson Monte RN [place] Coordinator  Office: [phone]  24/7 Pager: [phone]

## 2021-07-11 NOTE — Progress Notes (Signed)
Cardiac Individual Treatment Plan  Patient Details  Name: Chad [name]. MRN: [id number] Date of Birth: 09-22-1961 Referring Provider:   Flowsheet Row CARDIAC REHAB PHASE II ORIENTATION from 05/26/2021 in [place]  Referring Provider Chad Bickers, MD       Initial Encounter Date:  [name] PHASE II ORIENTATION from 05/26/2021 in [place]  Date 05/26/21       Visit Diagnosis: Heart failure, chronic systolic ([place])  S/P LVAD placement at [facility] 6/21  Patient's Home Medications on Admission:  Current Outpatient Medications:    acetaminophen (TYLENOL) 325 MG tablet, Take 2 tablets (650 mg total) by mouth every 4 (four) hours as needed for headache or mild pain., Disp: , Rfl:    Alirocumab (PRALUENT) 75 MG/ML SOAJ, Inject 75 mg into the skin every 14 (fourteen) days. (Patient not taking: No sig reported), Disp: 2 mL, Rfl: 11   allopurinol (ZYLOPRIM) 100 MG tablet, Take 1 tablet (100 mg total) by mouth daily., Disp: 30 tablet, Rfl: 5   alum & mag hydroxide-simeth (MAALOX/MYLANTA) 176-160-73 MG/5ML suspension, Take 15 mLs by mouth every 6 (six) hours as needed for indigestion or heartburn. (Patient not taking: No sig reported), Disp: 355 mL, Rfl: 0   amiodarone (PACERONE) 200 MG tablet, Take 1 tablet (200 mg total) by mouth daily., Disp: 90 tablet, Rfl: 3   aspirin EC 81 MG tablet, Take 81 mg by mouth every evening., Disp: , Rfl:    carvedilol (COREG) 6.25 MG tablet, Take 1 tablet (6.25 mg total) by mouth 2 (two) times daily with a meal. If systolic is less than 90 you may hold Coreg until systolic is greater than 90, then resume taking, Disp: 60 tablet, Rfl: 5   FARXIGA 5 MG TABS tablet, Take 5 mg by mouth every morning., Disp: , Rfl:    fluticasone (FLONASE) 50 MCG/ACT nasal spray, Place 2 sprays into both nostrils daily as needed for allergies or rhinitis. , Disp: , Rfl:    gabapentin (NEURONTIN)  300 MG capsule, Take 1 capsule (300 mg total) by mouth at bedtime. (Patient taking differently: Take 300 mg by mouth 3 (three) times daily.), Disp: 30 capsule, Rfl: 3   hydrALAZINE (APRESOLINE) 25 MG tablet, Take 100 mg by mouth 3 (three) times daily., Disp: , Rfl:    isosorbide mononitrate (IMDUR) 30 MG 24 hr tablet, Take 30 mg by mouth daily., Disp: , Rfl:    methocarbamol (ROBAXIN) 750 MG tablet, Take 750 mg by mouth daily., Disp: , Rfl:    MITIGARE 0.6 MG CAPS, Take 0.6 mg by mouth daily., Disp: 120 capsule, Rfl: 0   nitroGLYCERIN (NITROSTAT) 0.4 MG SL tablet, Place 1 tablet (0.4 mg total) under the tongue every 5 (five) minutes as needed for chest pain. May take if diastolic BP is > 90, Disp: 30 tablet, Rfl: 3   Oxycodone HCl 20 MG TABS, Take 20 mg by mouth every 6 (six) hours as needed (For pain). , Disp: , Rfl:    PAXLOVID TBPK, TAKE 2  BY MOUTH TWICE DAILY FOR 5 DAYS PATIENT  GFR  IS  55., Disp: 20 tablet, Rfl: 0   potassium chloride (KLOR-CON) 20 MEQ packet, Take 20 mEq by mouth as directed. (Patient not taking: No sig reported), Disp: , Rfl:    potassium chloride SA (KLOR-CON) 20 MEQ tablet, TAKE 1 TABLET BY MOUTH TWICE DAILY. TAKE AN EXTRA 2 TABLETS WHEN YOU TAKE METOLAZONE. (Patient not taking:  Reported on 07/11/2021), Disp: 135 tablet, Rfl: 0   sacubitril-valsartan (ENTRESTO) 97-103 MG, Take 1 tablet by mouth every 12 (twelve) hours., Disp: , Rfl:    spironolactone (ALDACTONE) 25 MG tablet, Take 1 tablet by mouth daily., Disp: , Rfl:    torsemide (DEMADEX) 20 MG tablet, Take 1 tablet (20 mg total) by mouth every Monday, Wednesday, and Friday. (Patient not taking: Reported on 07/11/2021), Disp: 60 tablet, Rfl: 4   warfarin (COUMADIN) 2 MG tablet, Take by mouth. 7 mg daily per [facility]/Heart Failure Team, Disp: , Rfl:   Past Medical History: Past Medical History:  Diagnosis Date   AICD (automatic cardioverter/defibrillator) present    Anemia    Anginal pain ([place])    Anxiety     Aortic dissection, thoracoabdominal ([place])    7/10: Type I s/p repair   Arthritis    Asthma    CAD (coronary artery disease)    a. s/p CABG 2006;  b. DES to PDA 2011 (cath: Dx not seen, dRCA/PDA tx with DES; S-PDA occluded (culprit), S-Dx occluded, S-RI and OM ok, L-LAD ok   Carotid stenosis    dopplers 2011: 0-39% bilat.   Chest pain syndrome    CHF (congestive heart failure) ([place])    06/14/21 LVAD placemnt [facility]   Chronic bronchitis ([facility])    Chronic lower back pain    Chronic systolic heart failure ([place])    a. 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; the other is hanging on" (11/07/2016)   Dyspnea    patient denies at 12/06/2018 appt    Family history of adverse reaction to anesthesia    "sister hard to wake up"   GERD (gastroesophageal reflux disease)    Gout    Heart murmur    HLD (hyperlipidemia)    HTN (hypertension)    severe   Myocardial infarction [facility])    "many" (11/07/2016)   PONV (postoperative nausea and vomiting)    Sleep apnea    does not use mask    Thyroid cancer ([place])    [employer]    Tobacco Use: Social History   Tobacco Use  Smoking Status Never  Smokeless Tobacco Never    Labs: Recent Review Flowsheet Data     Labs for ITP Cardiac and Pulmonary Rehab Latest Ref Rng & Units 05/02/2020 05/03/2020 05/03/2020 09/28/2020 07/11/2021   Cholestrol 0 - 200 mg/dL - - - 134 194   LDLCALC 0 - 99 mg/dL - - - 74 123(H)   LDLDIRECT mg/dL - - - - -   HDL >40 mg/dL - - - 42 46   Trlycerides <150 mg/dL - - - 98 125   Hemoglobin A1c 4.0 - 5.6 % - - - - -   PHART 7.350 - 7.450 - - - - -   PCO2ART 35.0 - 45.0 mmHg - - - - -   HCO3 20.0 - 28.0 mmol/L - - - - -   TCO2 22 - 32 mmol/L - - - - -    ACIDBASEDEF 0.0 - 2.0 mmol/L - - - - -   O2SAT % 66.4 45.6 59.1 - -  Capillary Blood Glucose: Lab Results  Component Value Date   GLUCAP 100 (H) 05/30/2021   GLUCAP 128 (H) 06/20/2010   GLUCAP 133 (H) 04/29/2010   GLUCAP 122 (H) 06/24/2009   GLUCAP 141 (H) 06/24/2009     Exercise Target Goals: Exercise Program Goal: Individual exercise prescription set using results from initial 6 min walk test and THRR while considering  patient's activity barriers and safety.   Exercise Prescription Goal: Initial exercise prescription builds to 30-45 minutes a day of aerobic activity, 2-3 days per week.  Home exercise guidelines will be given to patient during program as part of exercise prescription that the participant will acknowledge.  Activity Barriers & Risk Stratification:  Activity Barriers & Cardiac Risk Stratification - 05/26/21 1150       Activity Barriers & Cardiac Risk Stratification   Activity Barriers Arthritis;Back Problems;Joint Problems;Decreased Ventricular Function;History of Falls;Other (comment)    Comments LVAD    Cardiac Risk Stratification High             6 Minute Walk:  6 Minute Walk     Row Name 05/26/21 1044         6 Minute Walk   Phase Initial     Distance 1343 feet     Walk Time 6 minutes     # of Rest Breaks 0     MPH 2.54     METS 3.67     RPE 9     Perceived Dyspnea  0     VO2 Peak 12.84     Symptoms Yes (comment)     Comments Low back pain (chronic) 7/10     Resting HR 73 bpm     Resting BP 92/0  MAP     Resting Oxygen Saturation  97 %     Exercise Oxygen Saturation  during 6 min walk 96 %     Max Ex. HR 105 bpm     Max Ex. BP 96/0  MAP     2 Minute Post BP 92/0  MAP              Oxygen Initial Assessment:   Oxygen Re-Evaluation:   Oxygen Discharge (Final Oxygen Re-Evaluation):   Initial Exercise Prescription:  Initial Exercise Prescription - 05/26/21 1100       Date of Initial Exercise RX and Referring  Provider   Date 05/26/21    Referring Provider Chad Bickers, MD    Expected Discharge Date 07/22/21      NuStep   Level 2    SPM 85    Minutes 30    METs 2.4      Prescription Details   Frequency (times per week) 3    Duration Progress to 30 minutes of continuous aerobic without signs/symptoms of physical distress      Intensity   THRR 40-80% of Max Heartrate 65 - 130    Ratings of Perceived Exertion 11-13    Perceived Dyspnea 0-4      Progression   Progression Continue progressive overload as per policy without signs/symptoms or physical distress.      Resistance Training   Training Prescription Yes    Weight 4 lbs    Reps 10-15             Perform Capillary Blood Glucose checks as needed.  Exercise Prescription Changes:   Exercise Prescription Changes     Row Name 05/30/21 1630 06/10/21 1630  Response to Exercise   Blood Pressure (Admit) 96/0  MAP 88/0  MAP      Blood Pressure (Exercise) 110/0  MAP 80/0  MAP      Blood Pressure (Exit) 96/0  MAP 74/0  MAP      Heart Rate (Admit) 84 bpm 97 bpm      Heart Rate (Exercise) 110 bpm 108 bpm      Heart Rate (Exit) 83 bpm 91 bpm      Rating of Perceived Exertion (Exercise) 13 11      Symptoms None None      Comments Pt's first day in the CRP2 program Reviewed MET's, goals and Home Ex. RX      Duration Progress to 30 minutes of  aerobic without signs/symptoms of physical distress Progress to 30 minutes of  aerobic without signs/symptoms of physical distress      Intensity THRR unchanged THRR unchanged             Progression      Progression Continue to progress workloads to maintain intensity without signs/symptoms of physical distress. Continue to progress workloads to maintain intensity without signs/symptoms of physical distress.      Average METs 2.3 2.1             Resistance Training      Training Prescription Yes Yes      Weight 3 lbs 3 lbs      Reps 10-15 10-15      Time 10 Minutes 10  Minutes             Interval Training      Interval Training No No             NuStep      Level 2 2      SPM 80 80      Minutes 26 26      METs 2.3 2.1             Home Exercise Plan      Plans to continue exercise at -- [employer] (comment)      Frequency -- Add 1 additional day to program exercise sessions.      Initial Home Exercises Provided -- 06/11/19              Exercise Comments:   Exercise Comments     Row Name 05/30/21 1630 06/10/21 1630 07/08/21 1354       Exercise Comments Pt's first day in the CRP2 program. Pt with no complaints and off to a good start. Reviewed MET's, Goals and home EX Rx with pt today. Pt is tolerating exercise well and has an average MET level of 2.1. Pt feels he would like more variety in his workout to make it more interesting. Plans to add in Arm ergo on his next session because he has a desire to tone arms. Pt lives an active lifestyle, but currently does not exercise on his own. Encouraged pt to add in one day for 30 minutes at his local gym. He enjoys the seated bike and treadmill. Encouraged his to maintain THHR, RPE, and BP paramets to safe exercise. Also reminded pt to keep his battery backup with him, a cell phone and to stay hydrated while exercising. Unable to go over MET's and goals with pt due to pt being absent for illness.              Exercise Goals and Review:   Exercise Goals  [place] Name 05/26/21 1150             Exercise Goals   Increase Physical Activity Yes       Intervention Provide advice, education, support and counseling about physical activity/exercise needs.;Develop an individualized exercise prescription for aerobic and resistive training based on initial evaluation findings, risk stratification, comorbidities and participant's personal goals.       Expected Outcomes Short Term: Attend rehab on a regular basis to increase amount of physical activity.;Long Term: Add in home exercise to make  exercise part of routine and to increase amount of physical activity.;Long Term: Exercising regularly at least 3-5 days a week.       Increase Strength and Stamina Yes       Intervention Provide advice, education, support and counseling about physical activity/exercise needs.;Develop an individualized exercise prescription for aerobic and resistive training based on initial evaluation findings, risk stratification, comorbidities and participant's personal goals.       Expected Outcomes Short Term: Increase workloads from initial exercise prescription for resistance, speed, and METs.;Short Term: Perform resistance training exercises routinely during rehab and add in resistance training at home;Long Term: Improve cardiorespiratory fitness, muscular endurance and strength as measured by increased METs and functional capacity (6MWT)       Able to understand and use rate of perceived exertion (RPE) scale Yes       Intervention Provide education and explanation on how to use RPE scale       Expected Outcomes Short Term: Able to use RPE daily in rehab to express subjective intensity level;Long Term:  Able to use RPE to guide intensity level when exercising independently       Knowledge and understanding of Target Heart Rate Range (THRR) Yes       Intervention Provide education and explanation of THRR including how the numbers were predicted and where they are located for reference       Expected Outcomes Short Term: Able to state/look up THRR;Short Term: Able to use daily as guideline for intensity in rehab;Long Term: Able to use THRR to govern intensity when exercising independently       Understanding of Exercise Prescription Yes       Intervention Provide education, explanation, and written materials on patient's individual exercise prescription       Expected Outcomes Short Term: Able to explain program exercise prescription;Long Term: Able to explain home exercise prescription to exercise independently                 Exercise Goals Re-Evaluation :  Exercise Goals Re-Evaluation     Row Name 05/30/21 1630 06/10/21 1630           Exercise Goal Re-Evaluation   Exercise Goals Review Increase Physical Activity;Increase Strength and Stamina;Able to understand and use rate of perceived exertion (RPE) scale;Knowledge and understanding of Target Heart Rate Range (THRR);Understanding of Exercise Prescription Increase Physical Activity;Increase Strength and Stamina;Able to understand and use rate of perceived exertion (RPE) scale;Knowledge and understanding of Target Heart Rate Range (THRR);Understanding of Exercise Prescription      Comments Pt's first day in the CRP2 program. Pt understnads the Exercise Rx, THRR, and RPE scale. Reviewed MET's, Goals and home EX Rx with pt today. Pt is tolerating exercise well and has an average MET level of 2.1. Pt feels he would like more variety in his workout to make it more interesting. Plans to add in Arm ergo on his next session because he has a desire to  tone arms. Pt lives an active lifestyle, but currently does not exercise on his own. Encouraged  pt to add in one day for 30 minutes at his local gym. He enjoys the seated bike and treadmill. Encouraged his to maintain THHR, RPE, and BP paramets to safe exercise. Also reminded pt to keep his battery backup with him, a cell phone and to stay hydrated while exercising.      Expected Outcomes Will monitor pt progress and advance exercise workloads as tolerated. Will continue to monitor pt and progress workloads as tolerated without sign or symptom.               Discharge Exercise Prescription (Final Exercise Prescription Changes):  Exercise Prescription Changes - 06/10/21 1630       Response to Exercise   Blood Pressure (Admit) 88/0   MAP   Blood Pressure (Exercise) 80/0   MAP   Blood Pressure (Exit) 74/0   MAP   Heart Rate (Admit) 97 bpm    Heart Rate (Exercise) 108 bpm    Heart Rate (Exit) 91 bpm     Rating of Perceived Exertion (Exercise) 11    Symptoms None    Comments Reviewed MET's, goals and Home Ex. RX    Duration Progress to 30 minutes of  aerobic without signs/symptoms of physical distress    Intensity THRR unchanged      Progression   Progression Continue to progress workloads to maintain intensity without signs/symptoms of physical distress.    Average METs 2.1      Resistance Training   Training Prescription Yes    Weight 3 lbs    Reps 10-15    Time 10 Minutes      Interval Training   Interval Training No      NuStep   Level 2    SPM 80    Minutes 26    METs 2.1      Home Exercise Plan   Plans to continue exercise at [facility] (comment)    Frequency Add 1 additional day to program exercise sessions.    Initial Home Exercises Provided 06/11/19             Nutrition:  Target Goals: Understanding of nutrition guidelines, daily intake of sodium <1537m, cholesterol <2044m calories 30% from fat and 7% or less from saturated fats, daily to have 5 or more servings of fruits and vegetables.  Biometrics:  Pre Biometrics - 05/26/21 0900       Pre Biometrics   Waist Circumference 37 inches    Hip Circumference 38 inches    Waist to Hip Ratio 0.97 %    Triceps Skinfold 16 mm    % Body Fat 24.6 %    Grip Strength 60 kg    Flexibility 17 in    Single Leg Stand 21.5 seconds              Nutrition Therapy Plan and Nutrition Goals:  Nutrition Therapy & Goals - 06/08/21 0837       Nutrition Therapy   Diet TLC      Personal Nutrition Goals   Nutrition Goal Pt to build a healthy plate including vegetables, fruits, whole grains, and low-fat dairy products in a heart healthy meal plan.    Personal Goal #2 Pt to  learn to read labels and choose lower sodium foods and avoid eating out >2 times weekly    Personal Goal #3 Pt to meet estimated energy needs of 2415 kcals/daily  Intervention Plan   Intervention Prescribe, educate and counsel  regarding individualized specific dietary modifications aiming towards targeted core components such as weight, hypertension, lipid management, diabetes, heart failure and other comorbidities.;Nutrition handout(s) given to patient.    Expected Outcomes Long Term Goal: Adherence to prescribed nutrition plan.;Short Term Goal: A plan has been developed with personal nutrition goals set during dietitian appointment.             Nutrition Assessments:  MEDIFICTS Score Key: ?70 Need to make dietary changes  40-70 Heart Healthy Diet ? 40 Therapeutic Level Cholesterol Diet   Flowsheet Row CARDIAC REHAB PHASE II EXERCISE from 06/06/2021 in [place]  Picture Your Plate Total Score on Admission 67      Picture Your Plate Scores: <84 Unhealthy dietary pattern with much room for improvement. 41-50 Dietary pattern unlikely to meet recommendations for good health and room for improvement. 51-60 More healthful dietary pattern, with some room for improvement.  >60 Healthy dietary pattern, although there may be some specific behaviors that could be improved.    Nutrition Goals Re-Evaluation:  Nutrition Goals Re-Evaluation     Chad [name] Name 06/08/21 0839 07/12/21 0727           Goals   Current Weight 177 lb (80.3 kg) 181 lb 14.1 oz (82.5 kg)      Nutrition Goal Pt to build a healthy plate including vegetables, fruits, whole grains, and low-fat dairy products in a heart healthy meal plan. Pt to build a healthy plate including vegetables, fruits, whole grains, and low-fat dairy products in a heart healthy meal plan.             Personal Goal #2 Re-Evaluation      Personal Goal #2 Pt to  learn to read labels and choose lower sodium foods and avoid eating out >2 times weekly Pt to  learn to read labels and choose lower sodium foods and avoid eating out >2 times weekly             Personal Goal #3 Re-Evaluation      Personal Goal #3 Pt to meet estimated energy needs  of 2415 kcals/daily Pt to meet estimated energy needs of 2415 kcals/daily              Nutrition Goals Re-Evaluation:  Nutrition Goals Re-Evaluation     Chad [name] Name 06/08/21 0839 07/12/21 0727           Goals   Current Weight 177 lb (80.3 kg) 181 lb 14.1 oz (82.5 kg)      Nutrition Goal Pt to build a healthy plate including vegetables, fruits, whole grains, and low-fat dairy products in a heart healthy meal plan. Pt to build a healthy plate including vegetables, fruits, whole grains, and low-fat dairy products in a heart healthy meal plan.             Personal Goal #2 Re-Evaluation      Personal Goal #2 Pt to  learn to read labels and choose lower sodium foods and avoid eating out >2 times weekly Pt to  learn to read labels and choose lower sodium foods and avoid eating out >2 times weekly             Personal Goal #3 Re-Evaluation      Personal Goal #3 Pt to meet estimated energy needs of 2415 kcals/daily Pt to meet estimated energy needs of 2415 kcals/daily  Nutrition Goals Discharge (Final Nutrition Goals Re-Evaluation):  Nutrition Goals Re-Evaluation - 07/12/21 0727       Goals   Current Weight 181 lb 14.1 oz (82.5 kg)    Nutrition Goal Pt to build a healthy plate including vegetables, fruits, whole grains, and low-fat dairy products in a heart healthy meal plan.      Personal Goal #2 Re-Evaluation   Personal Goal #2 Pt to  learn to read labels and choose lower sodium foods and avoid eating out >2 times weekly      Personal Goal #3 Re-Evaluation   Personal Goal #3 Pt to meet estimated energy needs of 2415 kcals/daily             Psychosocial: Target Goals: Acknowledge presence or absence of significant depression and/or stress, maximize coping skills, provide positive support system. Participant is able to verbalize types and ability to use techniques and skills needed for reducing stress and depression.  Initial Review & Psychosocial Screening:   Initial Psych Review & Screening - 05/26/21 1346       Initial Review   Current issues with Current Stress Concerns    Source of Stress Concerns Unable to perform yard/household activities;Unable to participate in former interests or hobbies;Retirement/disability      Family Dynamics   Good Support System? Yes   Chad [name] lives with his son and one of his neices     Barriers   Psychosocial barriers to participate in program The patient should benefit from training in stress management and relaxation.      Screening Interventions   Interventions Encouraged to exercise    Expected Outcomes Long Term Goal: Stressors or current issues are controlled or eliminated.             Quality of Life Scores:  Quality of Life - 05/26/21 1258       Quality of Life   Select Quality of Life      Quality of Life Scores   Health/Function Pre 21 %    Socioeconomic Pre 25.71 %    Psych/Spiritual Pre 30 %    Family Pre 30 %    GLOBAL Pre 25.41 %            Scores of 19 and below usually indicate a poorer quality of life in these areas.  A difference of  2-3 points is a clinically meaningful difference.  A difference of 2-3 points in the total score of the Quality of Life Index has been associated with significant improvement in overall quality of life, self-image, physical symptoms, and general health in studies assessing change in quality of life.  PHQ-9: Recent Review Flowsheet Data     Depression screen [facility] 2/9 05/26/2021 05/26/2021 11/02/2020 08/05/2020 04/28/2020   Decreased Interest 0 0 0 0  0   Down, Depressed, Hopeless 0 0 0 0 0   PHQ - 2 Score 0 0 0 0 0   Altered sleeping - - 0 - -   Tired, decreased energy - - 0 - -   Change in appetite - - 0 - -   Feeling bad or failure about yourself  - - 0 - -   Trouble concentrating - - 0 - -   Moving slowly or fidgety/restless - - 0 - -   Suicidal thoughts - - 0 - -   PHQ-9 Score - - 0 - -      Interpretation of Total Score  Total Score  Depression Severity:  1-4 =  Minimal depression, 5-9 = Mild depression, 10-14 = Moderate depression, 15-19 = Moderately severe depression, 20-27 = Severe depression   Psychosocial Evaluation and Intervention:   Psychosocial Re-Evaluation:  Psychosocial Re-Evaluation     [place] Name 06/15/21 1459 07/14/21 1034           Psychosocial Re-Evaluation   Current issues with Current Stress Concerns Current Stress Concerns      Comments Chad [name] has not voiced any increased concerns or stressors since participating in phase 2 cardiac rehab. Chad [name] has not voiced any increased concerns or stressors since participating in phase 2 cardiac rehab. Chad [name]'s exercise is on hold due to testing positive for COVID 19      Expected Outcomes Chad [name] will have decreased  or stable stress upon completion of phase 2 cardiac rehab. Chad [name] will have decreased  or stable stress upon completion of phase 2 cardiac rehab.      Interventions Stress management education;Encouraged to attend Cardiac Rehabilitation for the exercise Stress management education;Encouraged to attend Cardiac Rehabilitation for the exercise      Continue Psychosocial Services  No Follow up required No Follow up required             Initial Review      Source of Stress Concerns Retirement/disability;Unable to participate in former interests or hobbies;Unable to perform yard/household activities;Chronic Illness Retirement/disability;Unable to participate in former interests or hobbies;Unable to perform yard/household activities;Chronic Illness      Comments Will continue to monitor and offer support as needed Will continue to monitor and offer support as needed              Psychosocial Discharge (Final Psychosocial Re-Evaluation):  Psychosocial Re-Evaluation - 07/14/21 1034       Psychosocial Re-Evaluation   Current issues with Current Stress Concerns    Comments Chad [name] has not voiced any increased concerns or stressors since participating in  phase 2 cardiac rehab. Chad [name]'s exercise is on hold due to testing positive for COVID 19    Expected Outcomes Chad [name] will have decreased  or stable stress upon completion of phase 2 cardiac rehab.    Interventions Stress management education;Encouraged to attend Cardiac Rehabilitation for the exercise    Continue Psychosocial Services  No Follow up required      Initial Review   Source of Stress Concerns Retirement/disability;Unable to participate in former interests or hobbies;Unable to perform yard/household activities;Chronic Illness    Comments Will continue to monitor and offer support as needed             Vocational Rehabilitation: Provide vocational rehab assistance to qualifying candidates.   Vocational Rehab Evaluation & Intervention:  Vocational Rehab - 05/26/21 1354       Initial Vocational Rehab Evaluation & Intervention   Assessment shows need for Vocational Rehabilitation No             Education: Education Goals: Education classes will be provided on a weekly basis, covering required topics. Participant will state understanding/return demonstration of topics presented.  Learning Barriers/Preferences:  Learning Barriers/Preferences - 05/26/21 1145       Learning Barriers/Preferences   Learning Barriers Sight    Learning Preferences Audio;Computer/Internet;Group Instruction;Individual Instruction;Pictoral;Skilled Demonstration;Verbal Instruction;Video;Written Material             Education Topics: Count Your Pulse:  -Group instruction provided by verbal instruction, demonstration, patient participation and written materials to support subject.  Instructors address importance of being able to find your pulse and how to count your pulse when at  home without a heart monitor.  Patients get hands on experience counting their pulse with staff help and individually.   Heart Attack, Angina, and Risk Factor Modification:  -Group instruction provided by  verbal instruction, video, and written materials to support subject.  Instructors address signs and symptoms of angina and heart attacks.    Also discuss risk factors for heart disease and how to make changes to improve heart health risk factors.   Functional Fitness:  -Group instruction provided by verbal instruction, demonstration, patient participation, and written materials to support subject.  Instructors address safety measures for doing things around the house.  Discuss how to get up and down off the floor, how to pick things up properly, how to safely get out of a chair without assistance, and balance training.   Meditation and Mindfulness:  -Group instruction provided by verbal instruction, patient participation, and written materials to support subject.  Instructor addresses importance of mindfulness and meditation practice to help reduce stress and improve awareness.  Instructor also leads participants through a meditation exercise.    Stretching for Flexibility and Mobility:  -Group instruction provided by verbal instruction, patient participation, and written materials to support subject.  Instructors lead participants through series of stretches that are designed to increase flexibility thus improving mobility.  These stretches are additional exercise for major muscle groups that are typically performed during regular warm up and cool down.   Hands Only CPR:  -Group verbal, video, and participation provides a basic overview of AHA guidelines for community CPR. Role-play of emergencies allow participants the opportunity to practice calling for help and chest compression technique with discussion of AED use.   Hypertension: -Group verbal and written instruction that provides a basic overview of hypertension including the most recent diagnostic guidelines, risk factor reduction with self-care instructions and medication management.    Nutrition I class: Heart Healthy Eating:   -Group instruction provided by PowerPoint slides, verbal discussion, and written materials to support subject matter. The instructor gives an explanation and review of the Therapeutic Lifestyle Changes diet recommendations, which includes a discussion on lipid goals, dietary fat, sodium, fiber, plant stanol/sterol esters, sugar, and the components of a well-balanced, healthy diet.   Nutrition II class: Lifestyle Skills:  -Group instruction provided by PowerPoint slides, verbal discussion, and written materials to support subject matter. The instructor gives an explanation and review of label reading, grocery shopping for heart health, heart healthy recipe modifications, and ways to make healthier choices when eating out.   Diabetes Question & Answer:  -Group instruction provided by PowerPoint slides, verbal discussion, and written materials to support subject matter. The instructor gives an explanation and review of diabetes co-morbidities, pre- and post-prandial blood glucose goals, pre-exercise blood glucose goals, signs, symptoms, and treatment of hypoglycemia and hyperglycemia, and foot care basics.   Diabetes Blitz:  -Group instruction provided by PowerPoint slides, verbal discussion, and written materials to support subject matter. The instructor gives an explanation and review of the physiology behind type 1 and type 2 diabetes, diabetes medications and rational behind using different medications, pre- and post-prandial blood glucose recommendations and Hemoglobin A1c goals, diabetes diet, and exercise including blood glucose guidelines for exercising safely.    Portion Distortion:  -Group instruction provided by PowerPoint slides, verbal discussion, written materials, and food models to support subject matter. The instructor gives an explanation of serving size versus portion size, changes in portions sizes over the last 20 years, and what consists of a serving from each food  group.   Stress Management:  -Group instruction provided by verbal instruction, video, and written materials to support subject matter.  Instructors review role of stress in heart disease and how to cope with stress positively.     Exercising on Your Own:  -Group instruction provided by verbal instruction, power point, and written materials to support subject.  Instructors discuss benefits of exercise, components of exercise, frequency and intensity of exercise, and end points for exercise.  Also discuss use of nitroglycerin and activating EMS.  Review options of places to exercise outside of rehab.  Review guidelines for sex with heart disease.   Cardiac Drugs I:  -Group instruction provided by verbal instruction and written materials to support subject.  Instructor reviews cardiac drug classes: antiplatelets, anticoagulants, beta blockers, and statins.  Instructor discusses reasons, side effects, and lifestyle considerations for each drug class.   Cardiac Drugs II:  -Group instruction provided by verbal instruction and written materials to support subject.  Instructor reviews cardiac drug classes: angiotensin converting enzyme inhibitors (ACE-I), angiotensin II receptor blockers (ARBs), nitrates, and calcium channel blockers.  Instructor discusses reasons, side effects, and lifestyle considerations for each drug class.   Anatomy and Physiology of the Circulatory System:  Group verbal and written instruction and models provide basic cardiac anatomy and physiology, with the coronary electrical and arterial systems. Review of: AMI, Angina, Valve disease, Heart Failure, Peripheral Artery Disease, Cardiac Arrhythmia, Pacemakers, and the ICD.   Other Education:  -Group or individual verbal, written, or video instructions that support the educational goals of the cardiac rehab program.   Holiday Eating Survival Tips:  -Group instruction provided by PowerPoint slides, verbal discussion, and  written materials to support subject matter. The instructor gives patients tips, tricks, and techniques to help them not only survive but enjoy the holidays despite the onslaught of food that accompanies the holidays.   Knowledge Questionnaire Score:  Knowledge Questionnaire Score - 05/26/21 1144       Knowledge Questionnaire Score   Pre Score 24/24             Core Components/Risk Factors/Patient Goals at Admission:  Personal Goals and Risk Factors at Admission - 05/26/21 1144       Core Components/Risk Factors/Patient Goals on Admission    Weight Management Weight Maintenance;Yes    Intervention Weight Management: Provide education and appropriate resources to help participant work on and attain dietary goals.;Weight Management: Develop a combined nutrition and exercise program designed to reach desired caloric intake, while maintaining appropriate intake of nutrient and fiber, sodium and fats, and appropriate energy expenditure required for the weight goal.    Expected Outcomes Short Term: Continue to assess and modify interventions until short term weight is achieved;Long Term: Adherence to nutrition and physical activity/exercise program aimed toward attainment of established weight goal;Weight Maintenance: Understanding of the daily nutrition guidelines, which includes 25-35% calories from fat, 7% or less cal from saturated fats, less than 212m cholesterol, less than 1.5gm of sodium, & 5 or more servings of fruits and vegetables daily    Heart Failure Yes    Intervention Provide a combined exercise and nutrition program that is supplemented with education, support and counseling about heart failure. Directed toward relieving symptoms such as shortness of breath, decreased exercise tolerance, and extremity edema.    Expected Outcomes Improve functional capacity of life;Short term: Attendance in program 2-3 days a week with increased exercise capacity. Reported lower sodium intake.  Reported increased fruit and vegetable intake. Reports medication compliance.;Short  term: Daily weights obtained and reported for increase. Utilizing diuretic protocols set by physician.;Long term: Adoption of self-care skills and reduction of barriers for early signs and symptoms recognition and intervention leading to self-care maintenance.    Hypertension Yes    Intervention Provide education on lifestyle modifcations including regular physical activity/exercise, weight management, moderate sodium restriction and increased consumption of fresh fruit, vegetables, and low fat dairy, alcohol moderation, and smoking cessation.;Monitor prescription use compliance.    Expected Outcomes Short Term: Continued assessment and intervention until BP is < 140/21m HG in hypertensive participants. < 130/877mHG in hypertensive participants with diabetes, heart failure or chronic kidney disease.;Long Term: Maintenance of blood pressure at goal levels.    Lipids Yes    Intervention Provide education and support for participant on nutrition & aerobic/resistive exercise along with prescribed medications to achieve LDL <7066mHDL >71m20m  Expected Outcomes Short Term: Participant states understanding of desired cholesterol values and is compliant with medications prescribed. Participant is following exercise prescription and nutrition guidelines.;Long Term: Cholesterol controlled with medications as prescribed, with individualized exercise RX and with personalized nutrition plan. Value goals: LDL < 70mg33mL > 40 mg.    Stress Yes    Intervention Offer individual and/or small group education and counseling on adjustment to heart disease, stress management and health-related lifestyle change. Teach and support self-help strategies.;Refer participants experiencing significant psychosocial distress to appropriate mental health specialists for further evaluation and treatment. When possible, include family members and  significant others in education/counseling sessions.    Expected Outcomes Short Term: Participant demonstrates changes in health-related behavior, relaxation and other stress management skills, ability to obtain effective social support, and compliance with psychotropic medications if prescribed.;Long Term: Emotional wellbeing is indicated by absence of clinically significant psychosocial distress or social isolation.             Core Components/Risk Factors/Patient Goals Review:   Goals and Risk Factor Review     Row Name 06/15/21 1509 07/14/21 1035           Core Components/Risk Factors/Patient Goals Review   Personal Goals Review Weight Management/Obesity;Heart Failure;Stress;Hypertension;Lipids Weight Management/Obesity;Heart Failure;Stress;Hypertension;Lipids      Review Chad [name]'s MAP's have been stable. Chad [name] has been doing well with exercise. Chad [name]'s last exercise session was on 06/29/21. Chad [name] has been out due to testing positive for COVID 19. Chad [name] is due to retrun to cardiac rehab on 07/18/21.      Expected Outcomes Karis will continue to partipate in phase 2 cardiac rehab for exercise, nutrition and lifestyle modifications Chad [name] will continue to partipate in phase 2 cardiac rehab for exercise, nutrition and lifestyle modifications               Core Components/Risk Factors/Patient Goals at Discharge (Final Review):   Goals and Risk Factor Review - 07/14/21 1035       Core Components/Risk Factors/Patient Goals Review   Personal Goals Review Weight Management/Obesity;Heart Failure;Stress;Hypertension;Lipids    Review Chad [name]'s last exercise session was on 06/29/21. Chad [name] has been out due to testing positive for COVID 19. Chad [name] is due to retrun to cardiac rehab on 07/18/21.    Expected Outcomes Chad [name] will continue to partipate in phase 2 cardiac rehab for exercise, nutrition and lifestyle modifications             ITP Comments:  ITP Comments     Row Name  05/26/21 1344 06/15/21 1458 07/14/21 1029       ITP Comments Dr TraciFransico Him  Medical Director 30 Day ITP Review. Chad [name] has god attendance and participation in phase 2 cardiac rehab so far 30 Day ITP Review. Chad [name] has been absent after testing positive for COVID 19. Chad [name] is going out of town and plans to return to exercise on 07/18/21              Comments: See ITP comment

## 2021-07-11 NOTE — Patient Instructions (Addendum)
Start Amiodarone 200 mg daily for palpitations 2. Coumadin dosing per [facility] 3. Return to [place] [facility] for follow up with Dr Haroldine Laws in 2 months  4. Below is the closest [facility] center to [place], [place] for your trip on Friday:  [facility] [address] [place] [address] Phone #: [phone]

## 2021-07-12 ENCOUNTER — Telehealth (HOSPITAL_COMMUNITY): Payer: Self-pay | Admitting: Family Medicine

## 2021-07-12 DIAGNOSIS — Z95811 Presence of heart assist device: Secondary | ICD-10-CM | POA: Diagnosis not present

## 2021-07-12 DIAGNOSIS — I5023 Acute on chronic systolic (congestive) heart failure: Secondary | ICD-10-CM | POA: Diagnosis not present

## 2021-07-12 NOTE — Progress Notes (Signed)
Advanced Heart Failure Clinic Note   Patient ID: Chad Bark., male   DOB: [birth year], 60 y.o.   MRN: [id number]   Primary Cardiologist:  Dr. Glori Bickers   History of Present Illness:  Chad [name] is a 60 y.o. male with aevere HTN, CAD s/p MI with CABG 2006 and DES to native PDA in 7510, systolic HF EF 25-85% s/p BOsSCI ICD. 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  Underwent high risk HM-III VAD placement at [facility] 6/21.    Echo 3/22 EF 20% RV moderate HK.  RHC with exercise 3/22 on 5600  Rest RA 9 PCWP 12 CI 2.4 MAP 100 Exercise PCWP 27 CI 2.5 pVO2 7.4 slope 42  Here for VAD f/u. Was listed for transplant at [facility] in April. Had Covid earlier this month so is now shaded for 2 weeks. He has f/u at [facility] with chest CT. If CT clear will relist. Says he feels great except for gout in his feet. Taking colchicine. Still having some palpitations associated with lightheadedness at times. Zio monitor showed multiple episodes of NSVT Denies orthopnea or PND. No fevers, chills or problems with driveline. Mild fatigue. No bleeding, melena or neuro symptoms. No VAD alarms. Taking all meds as prescribed.     VAD Indication: Destination Therapy - HM III implanted at [facility] 06/04/20   VAD interrogation: Speed: 5800 Flow: 5.4 Power: 4.6w    PI: 2.4 Alarms: none Events: 10-20 PI events daily Hct 34   Fixed speed: 5800 Low speed limit: 5500   Primary controller: back up battery due for replacement in 19 months Secondary controller:  back up battery due for replacement in  months-- did not bring today   I reviewed the LVAD parameters from today and compared the results to the patient's prior recorded data. LVAD interrogation was NEGATIVE for significant power changes,  NEGATIVE for clinical alarms and STABLE for PI events/speed drops. No programming changes were made and pump is functioning within specified parameters. Pt is performing daily controller and system monitor self tests along with completing weekly and monthly maintenance for LVAD equipment.    Review of systems complete and found to be negative unless listed in HPI.   Past Medical History:  Diagnosis Date   AICD (automatic cardioverter/defibrillator) present    Anemia    Anginal pain ([place])    Anxiety    Aortic dissection, thoracoabdominal ([place])    7/10: Type I s/p repair   Arthritis    Asthma    CAD (coronary artery disease)    a. s/p CABG 2006;  b. DES to PDA 2011 (cath: Dx not seen, dRCA/PDA tx with DES; S-PDA occluded (culprit), S-Dx occluded, S-RI and OM ok, L-LAD ok   Carotid stenosis    dopplers 2011: 0-39% bilat.   Chest pain syndrome    CHF (congestive heart failure) ([place])    06/14/21 LVAD placemnt [facility]   Chronic bronchitis (HCC)    Chronic lower back pain    Chronic systolic heart failure ([place])    a. 12/13 ECHO: EF 35-40%, sept, apical & posterobasal HK, LV mod dil & sys fx mod reduced, mild AI, MV mild reg, TV mild reg   Complication of anesthesia    "difficult to wake afterwards a couple of times" and patient  states he has woken during surgery    CRI (chronic renal insufficiency)    "one kidney is gone; the other is hanging on" (11/07/2016)   Dyspnea    patient denies at 12/06/2018 appt    Family history of adverse reaction to anesthesia    "sister hard to wake up"   GERD (gastroesophageal reflux disease)    Gout    Heart murmur    HLD (hyperlipidemia)    HTN (hypertension)    severe   Myocardial infarction [facility])    "many" (11/07/2016)   PONV (postoperative nausea and vomiting)    Sleep apnea    does not use mask    Thyroid cancer (HCC)    Hertle Cell    Current Outpatient Medications  Medication Sig Dispense Refill   acetaminophen (TYLENOL) 325 MG tablet  Take 2 tablets (650 mg total) by mouth every 4 (four) hours as needed for headache or mild pain.     allopurinol (ZYLOPRIM) 100 MG tablet Take 1 tablet (100 mg total) by mouth daily. 30 tablet 5   amiodarone (PACERONE) 200 MG tablet Take 1 tablet (200 mg total) by mouth daily. 90 tablet 3   aspirin EC 81 MG tablet Take 81 mg by mouth every evening.     carvedilol (COREG) 6.25 MG tablet Take 1 tablet (6.25 mg total) by mouth 2 (two) times daily with a meal. If systolic is less than 90 you may hold Coreg until systolic is greater than 90, then resume taking 60 tablet 5   FARXIGA 5 MG TABS tablet Take 5 mg by mouth every morning.     fluticasone (FLONASE) 50 MCG/ACT nasal spray Place 2 sprays into both nostrils daily as needed for allergies or rhinitis.      gabapentin (NEURONTIN) 300 MG capsule Take 1 capsule (300 mg total) by mouth at bedtime. (Patient taking differently: Take 300 mg by mouth 3 (three) times daily.) 30 capsule 3   hydrALAZINE (APRESOLINE) 25 MG tablet Take 100 mg by mouth 3 (three) times daily.     isosorbide mononitrate (IMDUR) 30 MG 24 hr tablet Take 30 mg by mouth daily.     methocarbamol (ROBAXIN) 750 MG tablet Take 750 mg by mouth daily.     MITIGARE 0.6 MG CAPS Take 0.6 mg by mouth daily. 120 capsule 0   nitroGLYCERIN (NITROSTAT) 0.4 MG SL tablet Place 1 tablet (0.4 mg total) under the tongue every 5 (five) minutes as needed for chest pain. May take if diastolic BP is > 90 30 tablet 3   Oxycodone HCl 20 MG TABS Take 20 mg by mouth every 6 (six) hours as needed (For pain).      PAXLOVID TBPK TAKE 2  BY MOUTH TWICE DAILY FOR 5 DAYS PATIENT  GFR  IS  55. 20 tablet 0   sacubitril-valsartan (ENTRESTO) 97-103 MG Take 1 tablet by mouth every 12 (twelve) hours.     spironolactone (ALDACTONE) 25 MG tablet Take 1 tablet by mouth daily.     warfarin (COUMADIN) 2 MG tablet Take by mouth. 7 mg daily per [facility]/Heart Failure Team     Alirocumab (PRALUENT) 75 MG/ML SOAJ Inject 75 mg  into the skin every 14 (fourteen) days. (Patient not taking: No sig reported) 2 mL 11   alum & mag hydroxide-simeth (MAALOX/MYLANTA) 200-200-20 MG/5ML suspension Take 15 mLs by mouth every 6 (six) hours as needed for indigestion or heartburn. (Patient not taking: No sig reported) 355 mL 0   potassium  chloride (KLOR-CON) 20 MEQ packet Take 20 mEq by mouth as directed. (Patient not taking: No sig reported)     potassium chloride SA (KLOR-CON) 20 MEQ tablet TAKE 1 TABLET BY MOUTH TWICE DAILY. TAKE AN EXTRA 2 TABLETS WHEN YOU TAKE METOLAZONE. (Patient not taking: Reported on 07/11/2021) 135 tablet 0   torsemide (DEMADEX) 20 MG tablet Take 1 tablet (20 mg total) by mouth every Monday, Wednesday, and Friday. (Patient not taking: Reported on 07/11/2021) 60 tablet 4   No current facility-administered medications for this encounter.    Allergies  Allergen Reactions   Contrast Media [Iodinated Diagnostic Agents] Anaphylaxis   Iohexol Anaphylaxis and Other (See Comments)    PT HAS ANAPHYLAXIS WITH CONTRAST MEDIA!   Lipitor [Atorvastatin Calcium] Anaphylaxis and Other (See Comments)    Large doses   Shellfish Allergy Anaphylaxis   Sulfa Antibiotics Shortness Of Breath and Swelling   Sulfonamide Derivatives Shortness Of Breath and Swelling   Almond Oil Itching    FACIAL/MOUTH ITCHING   Metrizamide Swelling    SWELLING REACTION UNSPECIFIED    Zocor [Simvastatin] Other (See Comments)    Muscle cramps   Food Itching    Shellfish, peaches, almonds, apples & kiwis   Latex Rash and Other (See Comments)    With long periods of exposure   Peach [Prunus Persica] Itching    Vital Signs: Vitals:   07/11/21 1119 07/11/21 1128  BP: (!) 94/0 (!) 98/57  Pulse: 77   SpO2: 96%   Weight: 81.1 kg (178 lb 12.8 oz)    Filed Weights   07/11/21 1119  Weight: 81.1 kg (178 lb 12.8 oz)    Wt Readings from Last 3 Encounters:  07/11/21 81.1 kg (178 lb 12.8 oz)  05/26/21 80.4 kg (177 lb 4 oz)  05/09/21 79.7 kg  (175 lb 12.8 oz)     Vital Signs: Doppler Pressure: 94 Automatc BP: 98/57 (83) HR: 77 SR w/ PVCs SPO2: 96%   Weight: 178.8 lb w/ eqt Last weight: 175.8 lb w/ eqt Home weights: 175 - 177 lbs      Exam: General:  NAD.  HEENT: normal  Neck: supple. JVP 8-9 Carotids 2+ bilat; no bruits. No lymphadenopathy or thryomegaly appreciated. Cor: LVAD hum.  Lungs: Clear. Abdomen: obese soft, nontender, non-distended. No hepatosplenomegaly. No bruits or masses. Good bowel sounds. Driveline site clean. Anchor in place.  Extremities: no cyanosis, clubbing, rash. Warm no edema  Gout in R great toe MTP Neuro: alert & oriented x 3. No focal deficits. Moves all 4 without problem    ASSESSMENT/PLAN:  1. Chronic systolic HF: Ischemic cardiomyopathy.  Echo (2/16) with EF 25%. S/p [employer] ICD. Echo 3/18. EF 20-25%. Echo 04/18/18: EF 20-25%, grade 2 DD. - s/p HM-III VAD at [facility] 6/21 - Echo and RHC at [facility] 3/22 (details above) - Improved NYHA II-III with VAD support.  - Volume status mildly elevated. Currently on orsemide 20 mg MWF. Will have him take an extra dose or two. - Continue Entresto 97/103 bid - Continue spiro 25 daily - Continue Farxiga 5 daily - Continue carvedilol 6.25 bid  - Continue hydral 100 tid and Imdur 30 daily - Listed for transplant at [facility] in 4/22. Now shaded due to COVID. Has f/u CT next week for relisting.   2. CAD s/p CABG 2006:  - stable - no s/s angina  3. VAD management - VAD interrogated personally. Parameters stable. - DL site ok - INR managed by [facility]. INR today 1.7. Results  sent to [place] team   4 CKD 3a in setting of solitary kidney s/p dissection - Continue ARNI and Farxiga - creatinine 1.20 today  5. HTN:  - Blood pressure well controlled. Continue current regimen.  6. Palpitations - Zio showed frequent NSVT - start amio 200 daily  7. Iron-deficiency anemia - has received Feraheme x 3MCV now up to 86  Total time spent 35 minutes. Over half  that time spent discussing above.    Glori Bickers, MD 11:43 PM

## 2021-07-13 ENCOUNTER — Telehealth (HOSPITAL_COMMUNITY): Payer: Self-pay | Admitting: *Deleted

## 2021-07-13 ENCOUNTER — Encounter (HOSPITAL_COMMUNITY): Admission: RE | Admit: 2021-07-13 | Payer: Medicaid Other | Source: Ambulatory Visit

## 2021-07-13 NOTE — Telephone Encounter (Signed)
Chad [name] called to report that he feels like he has phlegm in his chest and has an intolerance to cologne  and  perfume. Advised Magnum to follow up with the [place] clinic regarding these symptoms. Patient states understanding.Barnet Pall, RN,BSN 07/13/2021 2:33 PM

## 2021-07-14 ENCOUNTER — Encounter (HOSPITAL_COMMUNITY): Payer: Self-pay | Admitting: *Deleted

## 2021-07-14 DIAGNOSIS — Z95811 Presence of heart assist device: Secondary | ICD-10-CM

## 2021-07-14 DIAGNOSIS — I5022 Chronic systolic (congestive) heart failure: Secondary | ICD-10-CM

## 2021-07-15 ENCOUNTER — Encounter (HOSPITAL_COMMUNITY): Payer: Medicaid Other

## 2021-07-15 ENCOUNTER — Other Ambulatory Visit: Payer: Self-pay

## 2021-07-15 MED ORDER — MITIGARE 0.6 MG PO CAPS: 0.6000 mg | ORAL_CAPSULE | Freq: Every day | ORAL | 0 refills | Status: DC

## 2021-07-15 NOTE — Telephone Encounter (Signed)
Patient's niece calls nurse line requesting refill on Mitigare. Patient is currently out of town in [place] and does not have medication. Patient is currently experiencing flare up and would like partial refill sent to [facility] in [place], [place].   Please advise.   Talbot Grumbling, RN

## 2021-07-18 ENCOUNTER — Encounter (HOSPITAL_COMMUNITY): Payer: Medicaid Other

## 2021-07-20 ENCOUNTER — Other Ambulatory Visit: Payer: Self-pay | Admitting: Family Medicine

## 2021-07-20 ENCOUNTER — Encounter (HOSPITAL_COMMUNITY)
Admission: RE | Admit: 2021-07-20 | Discharge: 2021-07-20 | Disposition: A | Discharge status: Home / Self Care | Payer: Medicaid Other | Source: Ambulatory Visit | Attending: Internal Medicine | Admitting: Internal Medicine

## 2021-07-20 ENCOUNTER — Other Ambulatory Visit: Payer: Self-pay

## 2021-07-20 DIAGNOSIS — Z95811 Presence of heart assist device: Secondary | ICD-10-CM

## 2021-07-20 DIAGNOSIS — I5022 Chronic systolic (congestive) heart failure: Secondary | ICD-10-CM | POA: Diagnosis not present

## 2021-07-20 NOTE — Progress Notes (Signed)
Tison returned to exercise at cardiac rehab today. Map 90-98. No complaints voiced. Will continue to monitor the patient throughout the program.Dimitria Ketchum Venetia Maxon, RN,BSN 07/20/2021 4:21 PM

## 2021-07-21 DIAGNOSIS — Z7682 Awaiting organ transplant status: Secondary | ICD-10-CM | POA: Diagnosis not present

## 2021-07-21 DIAGNOSIS — Z8616 Personal history of COVID-19: Secondary | ICD-10-CM | POA: Diagnosis not present

## 2021-07-21 DIAGNOSIS — Z952 Presence of prosthetic heart valve: Secondary | ICD-10-CM | POA: Diagnosis not present

## 2021-07-21 DIAGNOSIS — J984 Other disorders of lung: Secondary | ICD-10-CM | POA: Diagnosis not present

## 2021-07-21 DIAGNOSIS — Z7901 Long term (current) use of anticoagulants: Secondary | ICD-10-CM | POA: Diagnosis not present

## 2021-07-22 ENCOUNTER — Encounter (HOSPITAL_COMMUNITY)
Admission: RE | Admit: 2021-07-22 | Discharge: 2021-07-22 | Disposition: A | Discharge status: Home / Self Care | Payer: Medicaid Other | Source: Ambulatory Visit | Attending: Internal Medicine | Admitting: Internal Medicine

## 2021-07-22 ENCOUNTER — Other Ambulatory Visit: Payer: Self-pay

## 2021-07-22 DIAGNOSIS — I5022 Chronic systolic (congestive) heart failure: Secondary | ICD-10-CM | POA: Diagnosis not present

## 2021-07-22 DIAGNOSIS — Z95811 Presence of heart assist device: Secondary | ICD-10-CM

## 2021-07-22 NOTE — Progress Notes (Signed)
Chad [name] was noted to rub his neck and grimace looking like he was in severe pain at the end of his 6 minute walk test . I asked the patient if he was in pain. "I said no." Chad [name] told Chad [name] EP that he had 10/10 fatigue and neck pain which went way with rest. I advised Chad [name] that if he felt bad he should stop exercise today. MAP 84 max hear rate 114. Chad [name] said he felt fine and proceeded to complete his exercise session. Exit MAP 74 heart rate 94.Barnet Pall, RN,BSN 07/22/2021 4:57 PM

## 2021-07-25 ENCOUNTER — Other Ambulatory Visit: Payer: Self-pay

## 2021-07-25 ENCOUNTER — Encounter (HOSPITAL_COMMUNITY)
Admission: RE | Admit: 2021-07-25 | Discharge: 2021-07-25 | Disposition: A | Discharge status: Home / Self Care | Payer: Medicaid Other | Source: Ambulatory Visit | Attending: Internal Medicine | Admitting: Internal Medicine

## 2021-07-25 DIAGNOSIS — Z95811 Presence of heart assist device: Secondary | ICD-10-CM | POA: Insufficient documentation

## 2021-07-25 DIAGNOSIS — D509 Iron deficiency anemia, unspecified: Secondary | ICD-10-CM | POA: Insufficient documentation

## 2021-07-25 NOTE — Progress Notes (Signed)
Incomplete Session Note  Patient Details  Name: Chad [name]. MRN: [id number] Date of Birth: 07/22/1961 Referring Provider:   Flowsheet Row CARDIAC REHAB PHASE II ORIENTATION from 05/26/2021 in [place]  Referring Provider Glori Bickers, MD       [name]. did not complete his rehab session.  Birt had on Flip Flops today and was told he could not exercise without closed toe shoes on. Patient states understanding and says he will be out on Wednesday to attend a Funeral. Marks plans to return to exercise at cardiac rehab on Friday.Barnet Pall, RN,BSN 07/25/2021 4:54 PM

## 2021-07-27 ENCOUNTER — Encounter (HOSPITAL_COMMUNITY): Payer: Medicaid Other

## 2021-07-29 ENCOUNTER — Encounter (HOSPITAL_COMMUNITY)
Admission: RE | Admit: 2021-07-29 | Discharge: 2021-07-29 | Disposition: A | Discharge status: Home / Self Care | Payer: Medicaid Other | Source: Ambulatory Visit | Attending: Internal Medicine | Admitting: Internal Medicine

## 2021-08-04 ENCOUNTER — Encounter (HOSPITAL_COMMUNITY): Payer: Self-pay

## 2021-08-04 NOTE — Progress Notes (Signed)
Discharge Progress Report  Patient Details  Name: Chad [name]. MRN: [id number] Date of Birth: 1961/07/16 Referring Provider:   Flowsheet Row CARDIAC REHAB PHASE II ORIENTATION from 05/26/2021 in [facility]  Referring Provider Glori Bickers, MD        Number of Visits: 9  Reason for Discharge:  Patient reached a stable level of exercise. Patient independent in their exercise.  Smoking History:  Social History   Tobacco Use  Smoking Status Never  Smokeless Tobacco Never    Diagnosis:  Heart failure, chronic systolic (HCC)  S/P LVAD placement at [facility] 6/21  ADL [facility]:   Initial Exercise Prescription:  Initial Exercise Prescription - 05/26/21 1100       Date of Initial Exercise RX and Referring Provider   Date 05/26/21    Referring Provider Glori Bickers, MD    Expected Discharge Date 07/22/21      NuStep   Level 2    SPM 85    Minutes 30    METs 2.4      Prescription Details   Frequency (times per week) 3    Duration Progress to 30 minutes of continuous aerobic without signs/symptoms of physical distress      Intensity   THRR 40-80% of Max Heartrate 65 - 130    Ratings of Perceived Exertion 11-13    Perceived Dyspnea 0-4      Progression   Progression Continue progressive overload as per policy without signs/symptoms or physical distress.      Resistance Training   Training Prescription Yes    Weight 4 lbs    Reps 10-15             Discharge Exercise Prescription (Final Exercise Prescription Changes):  Exercise Prescription Changes - 07/22/21 1654       Response to Exercise   Blood Pressure (Admit) 84/0   MAP   Blood Pressure (Exercise) 84/0   MAP   Blood Pressure (Exit) 74/0   MAP   Heart Rate (Admit) 95 bpm    Heart Rate (Exercise) 117 bpm    Heart Rate (Exit) 94 bpm    Rating of Perceived Exertion (Exercise) 13    Symptoms None    Comments Pt last day of exercise in the CRP2 program     Duration Progress to 30 minutes of  aerobic without signs/symptoms of physical distress    Intensity THRR unchanged      Progression   Progression Continue to progress workloads to maintain intensity without signs/symptoms of physical distress.    Average METs 2.4      Interval Training   Interval Training No      NuStep   Level 2    SPM 80    Minutes 15    METs 2.4      Arm Ergometer   Level 1.2    Minutes 15    METs 2.4      Home Exercise Plan   Plans to continue exercise at [employer] (comment)    Frequency Add 1 additional day to program exercise sessions.    Initial Home Exercises Provided 06/11/19             Functional Capacity:  6 Minute Walk     Row Name 05/26/21 1044 07/22/21 1630       6 Minute Walk   Phase Initial Discharge    Distance 1343 feet 1512 feet    Distance % Change -- 12.58 %  Distance Feet Change -- 169 ft    Walk Time 6 minutes 6 minutes    # of Rest Breaks 0 0    MPH 2.54 2.86    METS 3.67 4.01    RPE 9 15    Perceived Dyspnea  0 0    VO2 Peak 12.84 14.06    Symptoms Yes (comment) Yes (comment)    Comments Low back pain (chronic) 7/10 10/10 headache/fatigue/neck pain. Relieved with rest.    Resting HR 73 bpm 95 bpm    Resting BP 92/0  MAP 84/0    Resting Oxygen Saturation  97 % 95 %    Exercise Oxygen Saturation  during 6 min walk 96 % 95 %    Max Ex. HR 105 bpm 116 bpm    Max Ex. BP 96/0  MAP 84/0    2 Minute Post BP 92/0  MAP --             Psychological, QOL, Others - Outcomes: PHQ 2/9: Depression screen [facility] 2/9 08/04/2021 05/26/2021 05/26/2021 11/02/2020 08/05/2020  Decreased Interest 0 0 0 0 0  Down, Depressed, Hopeless 0 0 0 0 0  PHQ - 2 Score 0 0 0 0 0  Altered sleeping - - - 0 -  Tired, decreased energy - - - 0 -  Change in appetite - - - 0 -  Feeling bad or failure about yourself  - - - 0 -  Trouble concentrating - - - 0 -  Moving slowly or fidgety/restless - - - 0 -  Suicidal thoughts - - - 0 -   PHQ-9 Score - - - 0 -  Some recent data might be hidden    Quality of Life:  Quality of Life - 07/29/21 1645       Quality of Life   Select Quality of Life      Quality of Life Scores   Health/Function Post 19.32 %    Socioeconomic Post 25.17 %    Psych/Spiritual Post 26.57 %    Family Post 29.38 %    GLOBAL Post 23.39 %             Personal Goals: Goals established at orientation with interventions provided to work toward goal.  Personal Goals and Risk Factors at Admission - 05/26/21 1144       Core Components/Risk Factors/Patient Goals on Admission    Weight Management Weight Maintenance;Yes    Intervention Weight Management: Provide education and appropriate resources to help participant work on and attain dietary goals.;Weight Management: Develop a combined nutrition and exercise program designed to reach desired caloric intake, while maintaining appropriate intake of nutrient and fiber, sodium and fats, and appropriate energy expenditure required for the weight goal.    Expected Outcomes Short Term: Continue to assess and modify interventions until short term weight is achieved;Long Term: Adherence to nutrition and physical activity/exercise program aimed toward attainment of established weight goal;Weight Maintenance: Understanding of the daily nutrition guidelines, which includes 25-35% calories from fat, 7% or less cal from saturated fats, less than 242m cholesterol, less than 1.5gm of sodium, & 5 or more servings of fruits and vegetables daily    Heart Failure Yes    Intervention Provide a combined exercise and nutrition program that is supplemented with education, support and counseling about heart failure. Directed toward relieving symptoms such as shortness of breath, decreased exercise tolerance, and extremity edema.    Expected Outcomes Improve functional capacity of life;Short term: Attendance in  program 2-3 days a week with increased exercise capacity. Reported  lower sodium intake. Reported increased fruit and vegetable intake. Reports medication compliance.;Short term: Daily weights obtained and reported for increase. Utilizing diuretic protocols set by physician.;Long term: Adoption of self-care skills and reduction of barriers for early signs and symptoms recognition and intervention leading to self-care maintenance.    Hypertension Yes    Intervention Provide education on lifestyle modifcations including regular physical activity/exercise, weight management, moderate sodium restriction and increased consumption of fresh fruit, vegetables, and low fat dairy, alcohol moderation, and smoking cessation.;Monitor prescription use compliance.    Expected Outcomes Short Term: Continued assessment and intervention until BP is < 140/36m HG in hypertensive participants. < 130/851mHG in hypertensive participants with diabetes, heart failure or chronic kidney disease.;Long Term: Maintenance of blood pressure at goal levels.    Lipids Yes    Intervention Provide education and support for participant on nutrition & aerobic/resistive exercise along with prescribed medications to achieve LDL <7066mHDL >24m54m  Expected Outcomes Short Term: Participant states understanding of desired cholesterol values and is compliant with medications prescribed. Participant is following exercise prescription and nutrition guidelines.;Long Term: Cholesterol controlled with medications as prescribed, with individualized exercise RX and with personalized nutrition plan. Value goals: LDL < 70mg7mL > 40 mg.    Stress Yes    Intervention Offer individual and/or small group education and counseling on adjustment to heart disease, stress management and health-related lifestyle change. Teach and support self-help strategies.;Refer participants experiencing significant psychosocial distress to appropriate mental health specialists for further evaluation and treatment. When possible, include family  members and significant others in education/counseling sessions.    Expected Outcomes Short Term: Participant demonstrates changes in health-related behavior, relaxation and other stress management skills, ability to obtain effective social support, and compliance with psychotropic medications if prescribed.;Long Term: Emotional wellbeing is indicated by absence of clinically significant psychosocial distress or social isolation.              Personal Goals Discharge:  Goals and Risk Factor Review     Row Name 06/15/21 1509 07/14/21 1035 08/04/21 1136         Core Components/Risk Factors/Patient Goals Review   Personal Goals Review Weight Management/Obesity;Heart Failure;Stress;Hypertension;Lipids Weight Management/Obesity;Heart Failure;Stress;Hypertension;Lipids Weight Management/Obesity;Heart Failure;Stress;Hypertension;Lipids     Review Caileb's MAP's have been stable. Mareon has been doing well with exercise. Adalid's last exercise session was on 06/29/21. Arron has been out due to testing positive for COVID 19. Tong is due to retrun to cardiac rehab on 07/18/21. Vladislav completed exercise at cardiac rehab on 07/24/21. Jaquarious had a headache the last day of exercise. [place] did well when in attendance. [place] was out due to having COVID 19 infection and was out of town to visit a sick relative who passed away     Expected Outcomes Sahand will continue to partipate in phase 2 cardiac rehab for exercise, nutrition and lifestyle modifications Kalim will continue to partipate in phase 2 cardiac rehab for exercise, nutrition and lifestyle modifications Tajee will continue to exercise, nutrition and lifestyle modifications upon completion of phase 2 cardiac rehab.              Exercise Goals and Review:  Exercise Goals     Row Name 05/26/21 1150             Exercise Goals   Increase Physical Activity Yes       Intervention Provide advice, education, support and counseling about  physical activity/exercise needs.;Develop an individualized exercise prescription for aerobic and resistive training based on initial evaluation findings, risk stratification, comorbidities and participant's personal goals.       Expected Outcomes Short Term: Attend rehab on a regular basis to increase amount of physical activity.;Long Term: Add in home exercise to make exercise part of routine and to increase amount of physical activity.;Long Term: Exercising regularly at least 3-5 days a week.       Increase Strength and Stamina Yes       Intervention Provide advice, education, support and counseling about physical activity/exercise needs.;Develop an individualized exercise prescription for aerobic and resistive training based on initial evaluation findings, risk stratification, comorbidities and participant's personal goals.       Expected Outcomes Short Term: Increase workloads from initial exercise prescription for resistance, speed, and METs.;Short Term: Perform resistance training exercises routinely during rehab and add in resistance training at home;Long Term: Improve cardiorespiratory fitness, muscular endurance and strength as measured by increased METs and functional capacity (6MWT)       Able to understand and use rate of perceived exertion (RPE) scale Yes       Intervention Provide education and explanation on how to use RPE scale       Expected Outcomes Short Term: Able to use RPE daily in rehab to express subjective intensity level;Long Term:  Able to use RPE to guide intensity level when exercising independently       Knowledge and understanding of Target Heart Rate Range (THRR) Yes       Intervention Provide education and explanation of THRR including how the numbers were predicted and where they are located for reference       Expected Outcomes Short Term: Able to state/look up THRR;Short Term: Able to use daily as guideline for intensity in rehab;Long Term: Able to use THRR to govern  intensity when exercising independently       Understanding of Exercise Prescription Yes       Intervention Provide education, explanation, and written materials on patient's individual exercise prescription       Expected Outcomes Short Term: Able to explain program exercise prescription;Long Term: Able to explain home exercise prescription to exercise independently                Exercise Goals Re-Evaluation:  Exercise Goals Re-Evaluation     [place] Name 05/30/21 1630 06/10/21 1630 07/29/21 1657         Exercise Goal Re-Evaluation   Exercise Goals Review Increase Physical Activity;Increase Strength and Stamina;Able to understand and use rate of perceived exertion (RPE) scale;Knowledge and understanding of Target Heart Rate Range (THRR);Understanding of Exercise Prescription Increase Physical Activity;Increase Strength and Stamina;Able to understand and use rate of perceived exertion (RPE) scale;Knowledge and understanding of Target Heart Rate Range (THRR);Understanding of Exercise Prescription Increase Physical Activity;Increase Strength and Stamina;Able to understand and use rate of perceived exertion (RPE) scale;Knowledge and understanding of Target Heart Rate Range (THRR);Understanding of Exercise Prescription     Comments Pt's first day in the CRP2 program. Pt understnads the Exercise Rx, THRR, and RPE scale. Reviewed MET's, Goals and home EX Rx with pt today. Pt is tolerating exercise well and has an average MET level of 2.1. Pt feels he would like more variety in his workout to make it more interesting. Plans to add in Arm ergo on his next session because he has a desire to tone arms. Pt lives an active lifestyle, but currently does not exercise on his  own. Encouraged  pt to add in one day for 30 minutes at his local gym. He enjoys the seated bike and treadmill. Encouraged his to maintain THHR, RPE, and BP paramets to safe exercise. Also reminded pt to keep his battery backup with him, a  cell phone and to stay hydrated while exercising. Pt graduated the [facility] program today. Pt had a headache but came in to drop off paperwork and complete his post-metrics. No exercise session today. Pt last day of exercise was on 07-22-21.     Expected Outcomes Will monitor pt progress and advance exercise workloads as tolerated. Will continue to monitor pt and progress workloads as tolerated without sign or symptom. Pt will continue to exercise on his own at home.              Nutrition & Weight - Outcomes:  Pre Biometrics - 05/26/21 0900       Pre Biometrics   Waist Circumference 37 inches    Hip Circumference 38 inches    Waist to Hip Ratio 0.97 %    Triceps Skinfold 16 mm    % Body Fat 24.6 %    Grip Strength 60 kg    Flexibility 17 in    Single Leg Stand 21.5 seconds             Post Biometrics - 07/29/21 1649        Post  Biometrics   Height 5' 11.25" (1.81 m)    Weight 81.6 kg    Waist Circumference 36.75 inches    Hip Circumference 41 inches    Waist to Hip Ratio 0.9 %    BMI (Calculated) 24.91    Triceps Skinfold 16 mm    % Body Fat 24.6 %    Grip Strength 55 kg    Flexibility 16 in    Single Leg Stand 30 seconds             Nutrition:  Nutrition Therapy & Goals - 06/08/21 0837       Nutrition Therapy   Diet TLC      Personal Nutrition Goals   Nutrition Goal Pt to build a healthy plate including vegetables, fruits, whole grains, and low-fat dairy products in a heart healthy meal plan.    Personal Goal #2 Pt to  learn to read labels and choose lower sodium foods and avoid eating out >2 times weekly    Personal Goal #3 Pt to meet estimated energy needs of 2415 kcals/daily      Intervention Plan   Intervention Prescribe, educate and counsel regarding individualized specific dietary modifications aiming towards targeted core components such as weight, hypertension, lipid management, diabetes, heart failure and other comorbidities.;Nutrition handout(s)  given to patient.    Expected Outcomes Long Term Goal: Adherence to prescribed nutrition plan.;Short Term Goal: A plan has been developed with personal nutrition goals set during dietitian appointment.             Nutrition Discharge:   Education Questionnaire Score:  Knowledge Questionnaire Score - 07/29/21 1645       Knowledge Questionnaire Score   Post Score 23/24             Goals reviewed with patient; copy given to patient.Pt graduated from cardiac rehab program on 07/22/21 with completion of 9 exercise sessions in Phase II. Pt maintained fair attendance and progressed nicely during his participation in rehab as evidenced by increased MET level.  [place] increased his distance on his post  exercise walk test by 169 feet.  Medication list reconciled. Repeat  PHQ score-  0.  Pt has made significant lifestyle changes and should be commended for his success. Pt feels he has achieved his goals during cardiac rehab.   Pt plans to continue exercising on his own at home. We are proud of [place]'s progress!Barnet Pall, RN,BSN 08/04/2021 11:44 AM

## 2021-08-10 DIAGNOSIS — Z7901 Long term (current) use of anticoagulants: Secondary | ICD-10-CM | POA: Diagnosis not present

## 2021-08-14 DIAGNOSIS — Z95811 Presence of heart assist device: Secondary | ICD-10-CM | POA: Diagnosis not present

## 2021-08-14 DIAGNOSIS — J90 Pleural effusion, not elsewhere classified: Secondary | ICD-10-CM | POA: Diagnosis not present

## 2021-08-14 DIAGNOSIS — I517 Cardiomegaly: Secondary | ICD-10-CM | POA: Diagnosis not present

## 2021-08-14 DIAGNOSIS — J9811 Atelectasis: Secondary | ICD-10-CM | POA: Diagnosis not present

## 2021-08-21 IMAGING — DX DG CHEST 1V PORT
1 series · 1 of 1 positions shown · non-contrast
Comparison: 05/01/2020

CLINICAL DATA: Weakness, whole body

EXAM:
PORTABLE CHEST 1 VIEW

[chest]
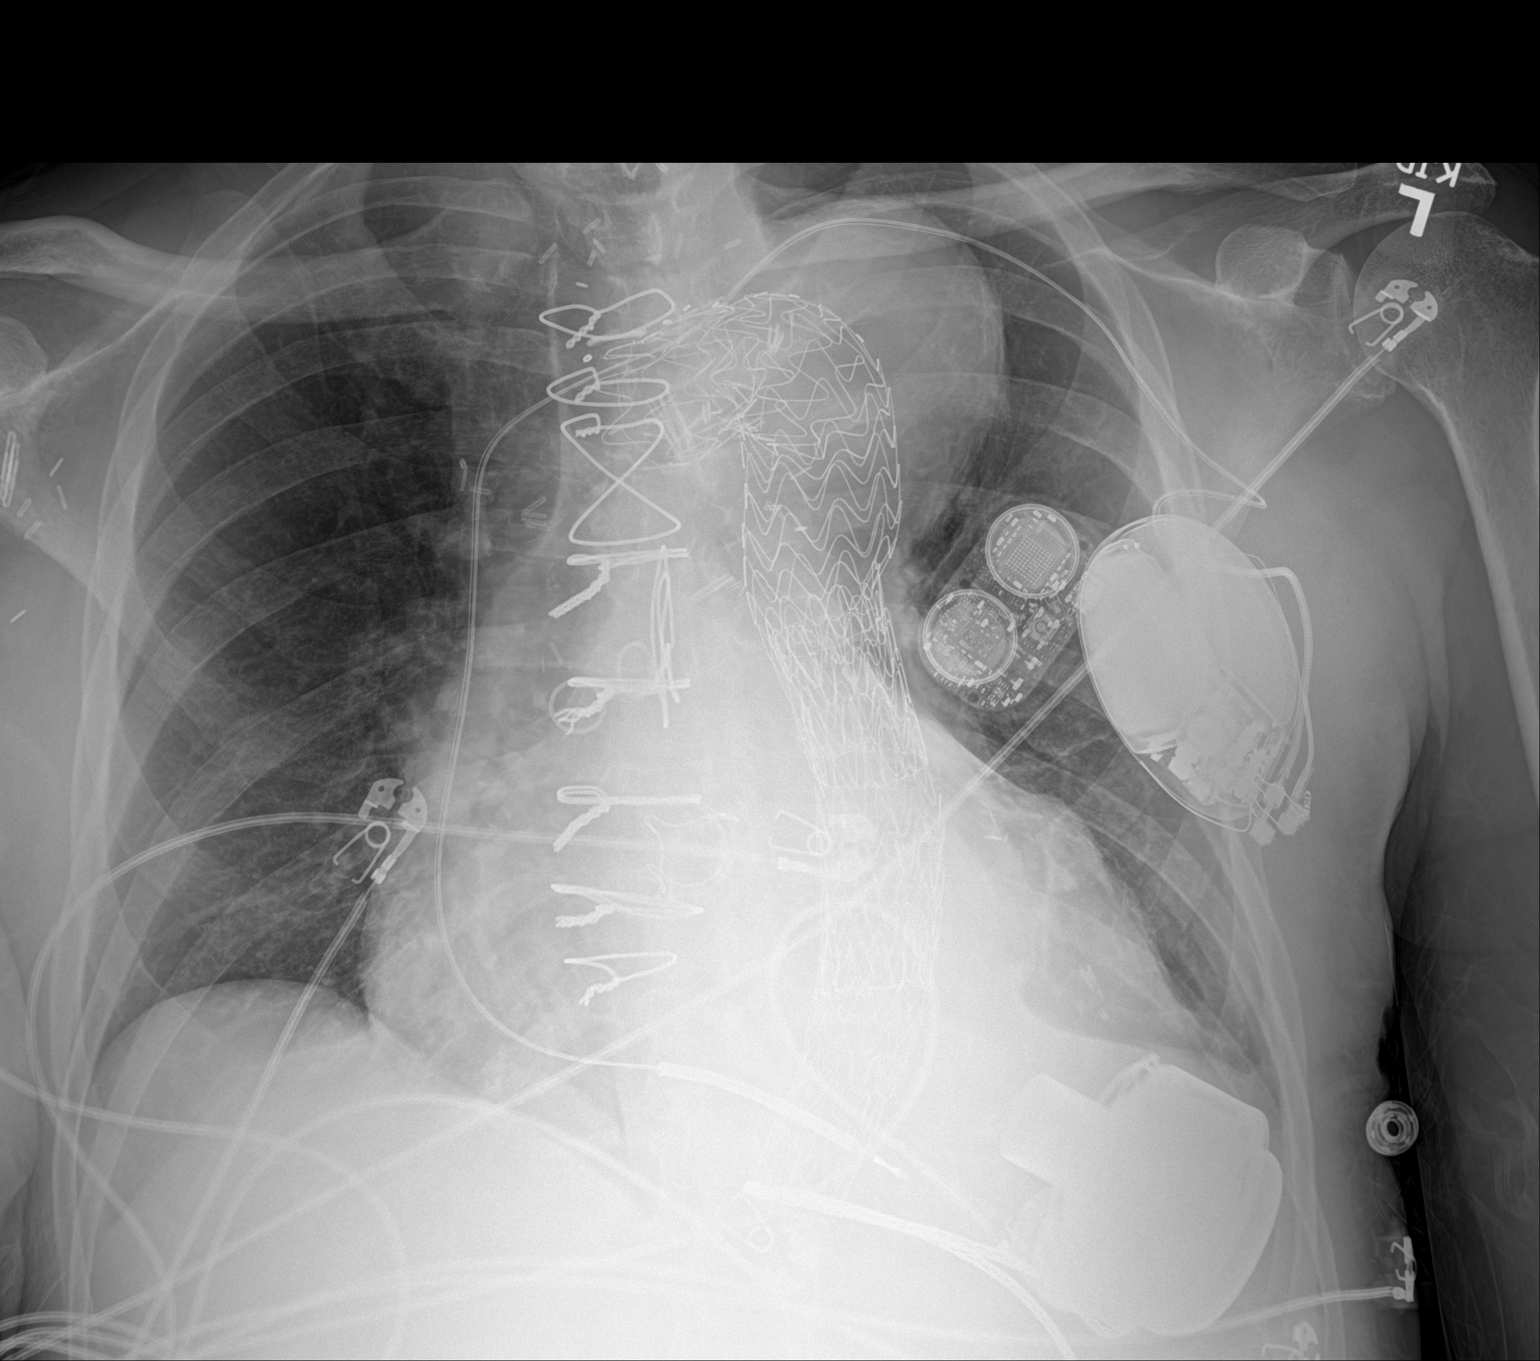

[1 of 1 positions shown; findings below may reference images not displayed]

FINDINGS: Aneurysmal aorta with stent grafting. There has also been CABG and
aortic valve replacement. Left ventricular assist device since
prior. Single chamber defibrillator lead into the right ventricle.
Mild blunting at left costophrenic sulcus may be from scarring. No
edema or focal pneumonia. Negative for pneumothorax.
IMPRESSION: No acute finding when compared with priors.

## 2021-08-24 DIAGNOSIS — Z7901 Long term (current) use of anticoagulants: Secondary | ICD-10-CM | POA: Diagnosis not present

## 2021-08-25 DIAGNOSIS — Z79891 Long term (current) use of opiate analgesic: Secondary | ICD-10-CM | POA: Diagnosis not present

## 2021-08-25 DIAGNOSIS — G894 Chronic pain syndrome: Secondary | ICD-10-CM | POA: Diagnosis not present

## 2021-09-02 ENCOUNTER — Encounter (HOSPITAL_COMMUNITY): Payer: Self-pay | Admitting: Emergency Medicine

## 2021-09-02 ENCOUNTER — Emergency Department (HOSPITAL_COMMUNITY): Payer: Medicaid Other

## 2021-09-02 ENCOUNTER — Other Ambulatory Visit: Payer: Self-pay

## 2021-09-02 ENCOUNTER — Emergency Department (HOSPITAL_COMMUNITY)
Admission: EM | Admit: 2021-09-02 | Discharge: 2021-09-03 | Disposition: A | Discharge status: Home / Self Care | Payer: Medicaid Other | Attending: Emergency Medicine | Admitting: Emergency Medicine

## 2021-09-02 DIAGNOSIS — N183 Chronic kidney disease, stage 3 unspecified: Secondary | ICD-10-CM | POA: Insufficient documentation

## 2021-09-02 DIAGNOSIS — Y92410 Unspecified street and highway as the place of occurrence of the external cause: Secondary | ICD-10-CM | POA: Insufficient documentation

## 2021-09-02 DIAGNOSIS — Z85850 Personal history of malignant neoplasm of thyroid: Secondary | ICD-10-CM | POA: Diagnosis not present

## 2021-09-02 DIAGNOSIS — Z79899 Other long term (current) drug therapy: Secondary | ICD-10-CM | POA: Insufficient documentation

## 2021-09-02 DIAGNOSIS — R1011 Right upper quadrant pain: Secondary | ICD-10-CM | POA: Insufficient documentation

## 2021-09-02 DIAGNOSIS — S3991XA Unspecified injury of abdomen, initial encounter: Secondary | ICD-10-CM | POA: Diagnosis not present

## 2021-09-02 DIAGNOSIS — I130 Hypertensive heart and chronic kidney disease with heart failure and stage 1 through stage 4 chronic kidney disease, or unspecified chronic kidney disease: Secondary | ICD-10-CM | POA: Diagnosis not present

## 2021-09-02 DIAGNOSIS — Z20822 Contact with and (suspected) exposure to covid-19: Secondary | ICD-10-CM | POA: Insufficient documentation

## 2021-09-02 DIAGNOSIS — S0083XA Contusion of other part of head, initial encounter: Secondary | ICD-10-CM | POA: Insufficient documentation

## 2021-09-02 DIAGNOSIS — Z91040 Latex allergy status: Secondary | ICD-10-CM | POA: Diagnosis not present

## 2021-09-02 DIAGNOSIS — Z041 Encounter for examination and observation following transport accident: Secondary | ICD-10-CM | POA: Diagnosis not present

## 2021-09-02 DIAGNOSIS — I712 Thoracic aortic aneurysm, without rupture, unspecified: Secondary | ICD-10-CM

## 2021-09-02 DIAGNOSIS — I5042 Chronic combined systolic (congestive) and diastolic (congestive) heart failure: Secondary | ICD-10-CM | POA: Diagnosis not present

## 2021-09-02 DIAGNOSIS — I517 Cardiomegaly: Secondary | ICD-10-CM | POA: Diagnosis not present

## 2021-09-02 DIAGNOSIS — R0789 Other chest pain: Secondary | ICD-10-CM | POA: Diagnosis not present

## 2021-09-02 DIAGNOSIS — J90 Pleural effusion, not elsewhere classified: Secondary | ICD-10-CM

## 2021-09-02 DIAGNOSIS — M25512 Pain in left shoulder: Secondary | ICD-10-CM | POA: Insufficient documentation

## 2021-09-02 DIAGNOSIS — M25511 Pain in right shoulder: Secondary | ICD-10-CM | POA: Insufficient documentation

## 2021-09-02 DIAGNOSIS — I2510 Atherosclerotic heart disease of native coronary artery without angina pectoris: Secondary | ICD-10-CM | POA: Insufficient documentation

## 2021-09-02 DIAGNOSIS — Z452 Encounter for adjustment and management of vascular access device: Secondary | ICD-10-CM | POA: Diagnosis not present

## 2021-09-02 DIAGNOSIS — Z7982 Long term (current) use of aspirin: Secondary | ICD-10-CM | POA: Diagnosis not present

## 2021-09-02 DIAGNOSIS — I313 Pericardial effusion (noninflammatory): Secondary | ICD-10-CM | POA: Diagnosis not present

## 2021-09-02 DIAGNOSIS — Z7901 Long term (current) use of anticoagulants: Secondary | ICD-10-CM | POA: Insufficient documentation

## 2021-09-02 DIAGNOSIS — M549 Dorsalgia, unspecified: Secondary | ICD-10-CM | POA: Insufficient documentation

## 2021-09-02 DIAGNOSIS — Z951 Presence of aortocoronary bypass graft: Secondary | ICD-10-CM | POA: Diagnosis not present

## 2021-09-02 DIAGNOSIS — S3992XA Unspecified injury of lower back, initial encounter: Secondary | ICD-10-CM | POA: Diagnosis not present

## 2021-09-02 DIAGNOSIS — I700 Atherosclerosis of aorta: Secondary | ICD-10-CM | POA: Diagnosis not present

## 2021-09-02 DIAGNOSIS — S0990XA Unspecified injury of head, initial encounter: Secondary | ICD-10-CM | POA: Diagnosis not present

## 2021-09-02 DIAGNOSIS — J45909 Unspecified asthma, uncomplicated: Secondary | ICD-10-CM | POA: Insufficient documentation

## 2021-09-02 DIAGNOSIS — J3489 Other specified disorders of nose and nasal sinuses: Secondary | ICD-10-CM | POA: Diagnosis not present

## 2021-09-02 DIAGNOSIS — S199XXA Unspecified injury of neck, initial encounter: Secondary | ICD-10-CM | POA: Diagnosis not present

## 2021-09-02 DIAGNOSIS — J479 Bronchiectasis, uncomplicated: Secondary | ICD-10-CM | POA: Diagnosis not present

## 2021-09-02 LAB — CBC WITH DIFFERENTIAL/PLATELET
Abs Immature Granulocytes: 0.01 10*3/uL (ref 0.00–0.07)
Basophils Absolute: 0 10*3/uL (ref 0.0–0.1)
Basophils Relative: 1 %
Eosinophils Absolute: 0.3 10*3/uL (ref 0.0–0.5)
Eosinophils Relative: 6 %
HCT: 34.7 % — ABNORMAL LOW (ref 39.0–52.0)
Hemoglobin: 11 g/dL — ABNORMAL LOW (ref 13.0–17.0)
Immature Granulocytes: 0 %
Lymphocytes Relative: 21 %
Lymphs Abs: 0.9 10*3/uL (ref 0.7–4.0)
MCH: 27.4 pg (ref 26.0–34.0)
MCHC: 31.7 g/dL (ref 30.0–36.0)
MCV: 86.3 fL (ref 80.0–100.0)
Monocytes Absolute: 0.5 10*3/uL (ref 0.1–1.0)
Monocytes Relative: 12 %
Neutro Abs: 2.6 10*3/uL (ref 1.7–7.7)
Neutrophils Relative %: 60 %
Platelets: 126 10*3/uL — ABNORMAL LOW (ref 150–400)
RBC: 4.02 MIL/uL — ABNORMAL LOW (ref 4.22–5.81)
RDW: 15.4 % (ref 11.5–15.5)
WBC: 4.3 10*3/uL (ref 4.0–10.5)
nRBC: 0 % (ref 0.0–0.2)

## 2021-09-02 LAB — COMPREHENSIVE METABOLIC PANEL
ALT: 18 U/L (ref 0–44)
AST: 22 U/L (ref 15–41)
Albumin: 3.8 g/dL (ref 3.5–5.0)
Alkaline Phosphatase: 104 U/L (ref 38–126)
Anion gap: 7 (ref 5–15)
BUN: 29 mg/dL — ABNORMAL HIGH (ref 6–20)
CO2: 27 mmol/L (ref 22–32)
Calcium: 9.1 mg/dL (ref 8.9–10.3)
Chloride: 102 mmol/L (ref 98–111)
Creatinine, Ser: 1.72 mg/dL — ABNORMAL HIGH (ref 0.61–1.24)
GFR, Estimated: 46 mL/min — ABNORMAL LOW (ref >60)
Glucose, Bld: 95 mg/dL (ref 70–99)
Potassium: 3.8 mmol/L (ref 3.5–5.1)
Sodium: 136 mmol/L (ref 135–145)
Total Bilirubin: 1 mg/dL (ref 0.3–1.2)
Total Protein: 7.4 g/dL (ref 6.5–8.1)

## 2021-09-02 LAB — TROPONIN I (HIGH SENSITIVITY): Troponin I (High Sensitivity): 29 ng/L — ABNORMAL HIGH (ref <18)

## 2021-09-02 LAB — PROTIME-INR
INR: 2.3 — ABNORMAL HIGH (ref 0.8–1.2)
Prothrombin Time: 25.6 seconds — ABNORMAL HIGH (ref 11.4–15.2)

## 2021-09-02 MED ORDER — MORPHINE SULFATE (PF) 4 MG/ML IV SOLN
4.0000 mg | Freq: Once | INTRAVENOUS | Status: AC
Start: 2021-09-02 — End: 2021-09-02
  Administered 2021-09-02: 4 mg via INTRAVENOUS
  Filled 2021-09-02: qty 1
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
## 2021-09-02 NOTE — ED Notes (Signed)
Called [facility] transfer line for pt transport

## 2021-09-03 LAB — RESP PANEL BY RT-PCR (FLU A&B, COVID) ARPGX2
Influenza A by PCR: NEGATIVE
Influenza B by PCR: NEGATIVE
SARS Coronavirus 2 by RT PCR: NEGATIVE

## 2021-09-03 NOTE — Discharge Instructions (Signed)
Schedule follow-up with your doctors at [facility] for a recheck.
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
## 2021-09-05 ENCOUNTER — Telehealth: Payer: Self-pay

## 2021-09-05 NOTE — Telephone Encounter (Signed)
Transition Care Management Follow-up Telephone Call Date of discharge and from where: 9/09-2021-[place] How have you been since you were released from the hospital? Patient stated he is hurting but he's ok Any questions or concerns? No  Items Reviewed: Did the pt receive and understand the discharge instructions provided? Yes  Medications obtained and verified?  No medication given at discharge.  Other? No  Any new allergies since your discharge? No  Dietary orders reviewed? N/A Do you have support at home? Yes   Home Care and Equipment/Supplies: Were home health services ordered? not applicable If so, what is the name of the agency? N/A  Has the agency set up a time to come to the patient's home? not applicable Were any new equipment or medical supplies ordered?  No What is the name of the medical supply agency? N/A Were you able to get the supplies/equipment? not applicable Do you have any questions related to the use of the equipment or supplies? No  Functional Questionnaire: (I = Independent and D = Dependent) ADLs: I  Bathing/Dressing- I  Meal Prep- I  Eating- I  Maintaining continence- I  Transferring/Ambulation- I  Managing Meds- I  Follow up appointments reviewed:  PCP Hospital f/u appt confirmed? No   Specialist Hospital f/u appt confirmed? No   Are transportation arrangements needed? No  If their condition worsens, is the pt aware to call PCP or go to the Emergency Dept.? Yes Was the patient provided with contact information for the PCP's office or ED? Yes Was to pt encouraged to call back with questions or concerns? Yes

## 2021-09-09 ENCOUNTER — Other Ambulatory Visit (HOSPITAL_COMMUNITY): Payer: Self-pay | Admitting: *Deleted

## 2021-09-09 DIAGNOSIS — Z7901 Long term (current) use of anticoagulants: Secondary | ICD-10-CM

## 2021-09-09 DIAGNOSIS — Z95811 Presence of heart assist device: Secondary | ICD-10-CM

## 2021-09-12 ENCOUNTER — Ambulatory Visit (HOSPITAL_COMMUNITY)
Admission: RE | Admit: 2021-09-12 | Discharge: 2021-09-12 | Disposition: A | Discharge status: Home / Self Care | Payer: Medicaid Other | Source: Ambulatory Visit | Attending: Internal Medicine | Admitting: Internal Medicine

## 2021-09-12 ENCOUNTER — Other Ambulatory Visit: Payer: Self-pay

## 2021-09-12 VITALS — BP 110/66 | HR 95 | Wt 182.6 lb

## 2021-09-12 DIAGNOSIS — I252 Old myocardial infarction: Secondary | ICD-10-CM | POA: Insufficient documentation

## 2021-09-12 DIAGNOSIS — Z951 Presence of aortocoronary bypass graft: Secondary | ICD-10-CM | POA: Insufficient documentation

## 2021-09-12 DIAGNOSIS — I2510 Atherosclerotic heart disease of native coronary artery without angina pectoris: Secondary | ICD-10-CM | POA: Diagnosis not present

## 2021-09-12 DIAGNOSIS — I255 Ischemic cardiomyopathy: Secondary | ICD-10-CM | POA: Insufficient documentation

## 2021-09-12 DIAGNOSIS — Z95811 Presence of heart assist device: Secondary | ICD-10-CM | POA: Diagnosis not present

## 2021-09-12 DIAGNOSIS — N182 Chronic kidney disease, stage 2 (mild): Secondary | ICD-10-CM | POA: Diagnosis not present

## 2021-09-12 DIAGNOSIS — D509 Iron deficiency anemia, unspecified: Secondary | ICD-10-CM | POA: Insufficient documentation

## 2021-09-12 DIAGNOSIS — Z09 Encounter for follow-up examination after completed treatment for conditions other than malignant neoplasm: Secondary | ICD-10-CM | POA: Diagnosis not present

## 2021-09-12 DIAGNOSIS — I5022 Chronic systolic (congestive) heart failure: Secondary | ICD-10-CM | POA: Insufficient documentation

## 2021-09-12 DIAGNOSIS — N1831 Chronic kidney disease, stage 3a: Secondary | ICD-10-CM | POA: Insufficient documentation

## 2021-09-12 DIAGNOSIS — Z8616 Personal history of COVID-19: Secondary | ICD-10-CM | POA: Insufficient documentation

## 2021-09-12 DIAGNOSIS — I472 Ventricular tachycardia: Secondary | ICD-10-CM | POA: Diagnosis not present

## 2021-09-12 DIAGNOSIS — Z7984 Long term (current) use of oral hypoglycemic drugs: Secondary | ICD-10-CM | POA: Diagnosis not present

## 2021-09-12 DIAGNOSIS — Z79899 Other long term (current) drug therapy: Secondary | ICD-10-CM | POA: Insufficient documentation

## 2021-09-12 DIAGNOSIS — Z7901 Long term (current) use of anticoagulants: Secondary | ICD-10-CM | POA: Diagnosis not present

## 2021-09-12 DIAGNOSIS — Z7682 Awaiting organ transplant status: Secondary | ICD-10-CM | POA: Diagnosis not present

## 2021-09-12 DIAGNOSIS — Z8774 Personal history of (corrected) congenital malformations of heart and circulatory system: Secondary | ICD-10-CM | POA: Insufficient documentation

## 2021-09-12 DIAGNOSIS — Z7982 Long term (current) use of aspirin: Secondary | ICD-10-CM | POA: Insufficient documentation

## 2021-09-12 DIAGNOSIS — I130 Hypertensive heart and chronic kidney disease with heart failure and stage 1 through stage 4 chronic kidney disease, or unspecified chronic kidney disease: Secondary | ICD-10-CM | POA: Diagnosis not present

## 2021-09-12 DIAGNOSIS — I10 Essential (primary) hypertension: Secondary | ICD-10-CM | POA: Diagnosis not present

## 2021-09-12 DIAGNOSIS — I4729 Other ventricular tachycardia: Secondary | ICD-10-CM

## 2021-09-12 DIAGNOSIS — R002 Palpitations: Secondary | ICD-10-CM | POA: Insufficient documentation

## 2021-09-12 LAB — CBC
HCT: 37.7 % — ABNORMAL LOW (ref 39.0–52.0)
Hemoglobin: 11.8 g/dL — ABNORMAL LOW (ref 13.0–17.0)
MCH: 27.3 pg (ref 26.0–34.0)
MCHC: 31.3 g/dL (ref 30.0–36.0)
MCV: 87.3 fL (ref 80.0–100.0)
Platelets: 145 10*3/uL — ABNORMAL LOW (ref 150–400)
RBC: 4.32 MIL/uL (ref 4.22–5.81)
RDW: 15.2 % (ref 11.5–15.5)
WBC: 3.9 10*3/uL — ABNORMAL LOW (ref 4.0–10.5)
nRBC: 0 % (ref 0.0–0.2)

## 2021-09-12 LAB — BASIC METABOLIC PANEL
Anion gap: 8 (ref 5–15)
BUN: 28 mg/dL — ABNORMAL HIGH (ref 6–20)
CO2: 26 mmol/L (ref 22–32)
Calcium: 9 mg/dL (ref 8.9–10.3)
Chloride: 102 mmol/L (ref 98–111)
Creatinine, Ser: 1.74 mg/dL — ABNORMAL HIGH (ref 0.61–1.24)
GFR, Estimated: 45 mL/min — ABNORMAL LOW (ref >60)
Glucose, Bld: 115 mg/dL — ABNORMAL HIGH (ref 70–99)
Potassium: 4.1 mmol/L (ref 3.5–5.1)
Sodium: 136 mmol/L (ref 135–145)

## 2021-09-12 LAB — LACTATE DEHYDROGENASE: LDH: 166 U/L (ref 98–192)

## 2021-09-12 LAB — PROTIME-INR
INR: 2.7 — ABNORMAL HIGH (ref 0.8–1.2)
Prothrombin Time: 28.5 seconds — ABNORMAL HIGH (ref 11.4–15.2)
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
## 2021-09-12 NOTE — Patient Instructions (Signed)
No medication changes today Coumadin dosing per [facility] Return to clinic in 3 months for follow up visit with Dr Haroldine Laws

## 2021-09-14 DIAGNOSIS — I5023 Acute on chronic systolic (congestive) heart failure: Secondary | ICD-10-CM | POA: Diagnosis not present

## 2021-09-22 DIAGNOSIS — Z79891 Long term (current) use of opiate analgesic: Secondary | ICD-10-CM | POA: Diagnosis not present

## 2021-09-22 DIAGNOSIS — G894 Chronic pain syndrome: Secondary | ICD-10-CM | POA: Diagnosis not present

## 2021-09-30 DIAGNOSIS — Z7901 Long term (current) use of anticoagulants: Secondary | ICD-10-CM | POA: Diagnosis not present

## 2021-10-04 ENCOUNTER — Other Ambulatory Visit (HOSPITAL_COMMUNITY): Payer: Self-pay | Admitting: Internal Medicine

## 2021-10-10 ENCOUNTER — Other Ambulatory Visit: Payer: Self-pay

## 2021-10-11 MED ORDER — MITIGARE 0.6 MG PO CAPS: 0.6000 mg | ORAL_CAPSULE | Freq: Every day | ORAL | 0 refills | Status: DC

## 2021-10-13 DIAGNOSIS — Z7901 Long term (current) use of anticoagulants: Secondary | ICD-10-CM | POA: Diagnosis not present

## 2021-10-16 ENCOUNTER — Other Ambulatory Visit (HOSPITAL_COMMUNITY): Payer: Self-pay | Admitting: Internal Medicine

## 2021-10-16 DIAGNOSIS — Z95811 Presence of heart assist device: Secondary | ICD-10-CM

## 2021-10-16 DIAGNOSIS — I5042 Chronic combined systolic (congestive) and diastolic (congestive) heart failure: Secondary | ICD-10-CM

## 2021-10-17 ENCOUNTER — Telehealth: Payer: Self-pay

## 2021-10-17 NOTE — Telephone Encounter (Signed)
Received PA request for Mitigare 0.6 mg capsules. Attempted to complete PA on covermymeds, received error stating that patient could not be found. Verified with pharmacy patient has [facility] on file.   Called and completed PA over the phone. Confirmation code: [id number]. We should receive fax within the next 24-48 hours.   Talbot Grumbling, RN

## 2021-10-18 ENCOUNTER — Other Ambulatory Visit (HOSPITAL_COMMUNITY): Payer: Self-pay | Admitting: Unknown Physician Specialty

## 2021-10-18 ENCOUNTER — Other Ambulatory Visit (HOSPITAL_COMMUNITY): Payer: Self-pay | Admitting: *Deleted

## 2021-10-18 DIAGNOSIS — Z95811 Presence of heart assist device: Secondary | ICD-10-CM

## 2021-10-18 DIAGNOSIS — Z7901 Long term (current) use of anticoagulants: Secondary | ICD-10-CM | POA: Diagnosis not present

## 2021-10-18 DIAGNOSIS — I5042 Chronic combined systolic (congestive) and diastolic (congestive) heart failure: Secondary | ICD-10-CM

## 2021-10-18 MED ORDER — CARVEDILOL 6.25 MG PO TABS: 6.2500 mg | ORAL_TABLET | Freq: Two times a day (BID) | ORAL | 5 refills | Status: DC

## 2021-10-18 MED ORDER — ALLOPURINOL 100 MG PO TABS: 100.0000 mg | ORAL_TABLET | Freq: Every day | ORAL | 5 refills | Status: DC

## 2021-10-18 MED ORDER — MITIGARE 0.6 MG PO CAPS: 0.6000 mg | ORAL_CAPSULE | Freq: Every day | ORAL | 3 refills | Status: DC

## 2021-10-21 ENCOUNTER — Other Ambulatory Visit (HOSPITAL_COMMUNITY): Payer: Self-pay

## 2021-10-21 DIAGNOSIS — Z95811 Presence of heart assist device: Secondary | ICD-10-CM

## 2021-10-21 DIAGNOSIS — I5042 Chronic combined systolic (congestive) and diastolic (congestive) heart failure: Secondary | ICD-10-CM

## 2021-10-21 MED ORDER — CARVEDILOL 6.25 MG PO TABS
6.2500 mg | ORAL_TABLET | Freq: Two times a day (BID) | ORAL | 5 refills | Status: DC
Start: 2021-10-21 — End: 2022-10-05

## 2021-11-03 ENCOUNTER — Telehealth (HOSPITAL_COMMUNITY): Payer: Self-pay | Admitting: *Deleted

## 2021-11-03 DIAGNOSIS — M1000 Idiopathic gout, unspecified site: Secondary | ICD-10-CM

## 2021-11-03 MED ORDER — COLCHICINE 0.6 MG PO TABS: 0.6000 mg | ORAL_TABLET | Freq: Every day | ORAL | 11 refills | Status: DC

## 2021-11-03 NOTE — Telephone Encounter (Signed)
Patient called to report he is having to pay large amount money out of money for [place]. Per Audry Riles, PharmD, unable to obtain prior auth for [place] per Medicaid. Medicaid will only pay for colchicine tablets.   [place], new Rx sent for colchicine. Called patient and informed of above. He verbalized understanding of same.  Zada Girt RN, [place] Coordinator [phone]

## 2021-11-24 ENCOUNTER — Other Ambulatory Visit (HOSPITAL_COMMUNITY): Payer: Self-pay | Admitting: Internal Medicine

## 2021-11-24 DIAGNOSIS — Z79891 Long term (current) use of opiate analgesic: Secondary | ICD-10-CM | POA: Diagnosis not present

## 2021-11-24 DIAGNOSIS — G894 Chronic pain syndrome: Secondary | ICD-10-CM | POA: Diagnosis not present

## 2021-11-28 DIAGNOSIS — Z7901 Long term (current) use of anticoagulants: Secondary | ICD-10-CM | POA: Diagnosis not present

## 2021-12-06 DIAGNOSIS — Z7901 Long term (current) use of anticoagulants: Secondary | ICD-10-CM | POA: Diagnosis not present

## 2021-12-06 DIAGNOSIS — Z95811 Presence of heart assist device: Secondary | ICD-10-CM | POA: Diagnosis not present

## 2021-12-10 ENCOUNTER — Telehealth: Payer: Self-pay | Admitting: Physician Assistant

## 2021-12-10 ENCOUNTER — Other Ambulatory Visit: Payer: Self-pay

## 2021-12-10 ENCOUNTER — Emergency Department (HOSPITAL_COMMUNITY): Payer: Medicaid Other

## 2021-12-10 ENCOUNTER — Encounter (HOSPITAL_COMMUNITY): Payer: Self-pay

## 2021-12-10 ENCOUNTER — Emergency Department (HOSPITAL_COMMUNITY)
Admission: EM | Admit: 2021-12-10 | Discharge: 2021-12-10 | Disposition: A | Discharge status: Other Acute Inpatient Hospital | Payer: Medicaid Other | Attending: Emergency Medicine | Admitting: Emergency Medicine

## 2021-12-10 DIAGNOSIS — Z79899 Other long term (current) drug therapy: Secondary | ICD-10-CM | POA: Insufficient documentation

## 2021-12-10 DIAGNOSIS — J90 Pleural effusion, not elsewhere classified: Secondary | ICD-10-CM | POA: Diagnosis not present

## 2021-12-10 DIAGNOSIS — N183 Chronic kidney disease, stage 3 unspecified: Secondary | ICD-10-CM | POA: Diagnosis not present

## 2021-12-10 DIAGNOSIS — I5042 Chronic combined systolic (congestive) and diastolic (congestive) heart failure: Secondary | ICD-10-CM | POA: Insufficient documentation

## 2021-12-10 DIAGNOSIS — I2510 Atherosclerotic heart disease of native coronary artery without angina pectoris: Secondary | ICD-10-CM | POA: Insufficient documentation

## 2021-12-10 DIAGNOSIS — I130 Hypertensive heart and chronic kidney disease with heart failure and stage 1 through stage 4 chronic kidney disease, or unspecified chronic kidney disease: Secondary | ICD-10-CM | POA: Diagnosis not present

## 2021-12-10 DIAGNOSIS — Z85850 Personal history of malignant neoplasm of thyroid: Secondary | ICD-10-CM | POA: Diagnosis not present

## 2021-12-10 DIAGNOSIS — J9811 Atelectasis: Secondary | ICD-10-CM | POA: Diagnosis not present

## 2021-12-10 DIAGNOSIS — Z91040 Latex allergy status: Secondary | ICD-10-CM | POA: Diagnosis not present

## 2021-12-10 DIAGNOSIS — Z7901 Long term (current) use of anticoagulants: Secondary | ICD-10-CM | POA: Insufficient documentation

## 2021-12-10 DIAGNOSIS — R1012 Left upper quadrant pain: Secondary | ICD-10-CM | POA: Diagnosis not present

## 2021-12-10 DIAGNOSIS — R079 Chest pain, unspecified: Secondary | ICD-10-CM | POA: Insufficient documentation

## 2021-12-10 DIAGNOSIS — Z951 Presence of aortocoronary bypass graft: Secondary | ICD-10-CM | POA: Diagnosis not present

## 2021-12-10 DIAGNOSIS — Z95811 Presence of heart assist device: Secondary | ICD-10-CM | POA: Diagnosis not present

## 2021-12-10 DIAGNOSIS — J45909 Unspecified asthma, uncomplicated: Secondary | ICD-10-CM | POA: Diagnosis not present

## 2021-12-10 DIAGNOSIS — I517 Cardiomegaly: Secondary | ICD-10-CM | POA: Diagnosis not present

## 2021-12-10 DIAGNOSIS — Z7982 Long term (current) use of aspirin: Secondary | ICD-10-CM | POA: Insufficient documentation

## 2021-12-10 DIAGNOSIS — I959 Hypotension, unspecified: Secondary | ICD-10-CM | POA: Diagnosis not present

## 2021-12-10 DIAGNOSIS — I712 Thoracic aortic aneurysm, without rupture, unspecified: Secondary | ICD-10-CM | POA: Diagnosis not present

## 2021-12-10 DIAGNOSIS — R0789 Other chest pain: Secondary | ICD-10-CM | POA: Diagnosis not present

## 2021-12-10 DIAGNOSIS — Z20822 Contact with and (suspected) exposure to covid-19: Secondary | ICD-10-CM | POA: Diagnosis not present

## 2021-12-10 LAB — CBC
HCT: 37.4 % — ABNORMAL LOW (ref 39.0–52.0)
Hemoglobin: 11.6 g/dL — ABNORMAL LOW (ref 13.0–17.0)
MCH: 27.6 pg (ref 26.0–34.0)
MCHC: 31 g/dL (ref 30.0–36.0)
MCV: 88.8 fL (ref 80.0–100.0)
Platelets: 98 10*3/uL — ABNORMAL LOW (ref 150–400)
RBC: 4.21 MIL/uL — ABNORMAL LOW (ref 4.22–5.81)
RDW: 14.6 % (ref 11.5–15.5)
WBC: 4.1 10*3/uL (ref 4.0–10.5)
nRBC: 0 % (ref 0.0–0.2)

## 2021-12-10 LAB — RESP PANEL BY RT-PCR (FLU A&B, COVID) ARPGX2
Influenza A by PCR: NEGATIVE
Influenza B by PCR: NEGATIVE
SARS Coronavirus 2 by RT PCR: NEGATIVE

## 2021-12-10 LAB — BASIC METABOLIC PANEL
Anion gap: 11 (ref 5–15)
BUN: 19 mg/dL (ref 6–20)
CO2: 25 mmol/L (ref 22–32)
Calcium: 8.6 mg/dL — ABNORMAL LOW (ref 8.9–10.3)
Chloride: 103 mmol/L (ref 98–111)
Creatinine, Ser: 1.42 mg/dL — ABNORMAL HIGH (ref 0.61–1.24)
GFR, Estimated: 57 mL/min — ABNORMAL LOW (ref >60)
Glucose, Bld: 104 mg/dL — ABNORMAL HIGH (ref 70–99)
Potassium: 3.8 mmol/L (ref 3.5–5.1)
Sodium: 139 mmol/L (ref 135–145)

## 2021-12-10 LAB — TROPONIN I (HIGH SENSITIVITY): Troponin I (High Sensitivity): 20 ng/L — ABNORMAL HIGH (ref <18)

## 2021-12-10 MED ORDER — ACETAMINOPHEN 325 MG PO TABS
650.0000 mg | ORAL_TABLET | Freq: Once | ORAL | Status: AC
  Administered 2021-12-10: 650 mg via ORAL
  Filled 2021-12-10: qty 2

## 2021-12-10 MED ORDER — MORPHINE SULFATE (PF) 2 MG/ML IV SOLN
2.0000 mg | Freq: Once | INTRAVENOUS | Status: AC
  Administered 2021-12-10: 2 mg via INTRAVENOUS
  Filled 2021-12-10: qty 1

## 2021-12-10 MED ORDER — SODIUM CHLORIDE 0.9 % IV BOLUS
1000.0000 mL | Freq: Once | INTRAVENOUS | Status: AC
  Administered 2021-12-10: 1000 mL via INTRAVENOUS

## 2021-12-10 NOTE — Telephone Encounter (Signed)
Patient called answering service that he is in emergency room and trying to reach Dr. Haroldine Laws.  Reviewed ER provider note.  Waiting [facility] transfer center recommendation.  ED provider also spoken with Dr. Aundra Dubin.  Patient presented for evaluation of chest pain.

## 2021-12-10 NOTE — ED Provider Notes (Addendum)
[facility] EMERGENCY DEPARTMENT Provider Note   CSN: [id number] Arrival date & time: 12/10/21  0501     History Chief Complaint  Patient presents with   Chest Pain    Chad [name]. is a 60 y.o. male.  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  By time he arrived to the ER he states that its subsided in intensity and currently a 4 out of 10 pain.  Does not radiate elsewhere.  No associated diaphoresis or palpitations no fevers or cough or vomiting or diarrhea per patient.      Past Medical History:  Diagnosis Date   AICD (automatic cardioverter/defibrillator) present    Anemia    Anginal pain ([place])    Anxiety    Aortic dissection, thoracoabdominal ([place])    7/10: Type I s/p repair   Arthritis    Asthma    CAD (coronary artery disease)    a. s/p CABG 2006;  b. DES to PDA 2011 (cath: Dx not seen, dRCA/PDA tx with DES; S-PDA occluded (culprit), S-Dx occluded, S-RI and OM ok, L-LAD ok   Carotid stenosis    dopplers 2011: 0-39% bilat.   Chest pain syndrome    CHF (congestive heart failure) ([place])    06/14/21 LVAD placemnt [facility]   Chronic bronchitis (HCC)    Chronic lower back pain    Chronic systolic heart failure ([place])    a. 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; the other is hanging on" (11/07/2016)   Dyspnea    patient denies at 12/06/2018 appt    Family history of adverse reaction to anesthesia    "sister hard to wake up"   GERD (gastroesophageal reflux disease)    Gout     Heart murmur    HLD (hyperlipidemia)    HTN (hypertension)    severe   Myocardial infarction ([place])    "many" (11/07/2016)   PONV (postoperative nausea and vomiting)    Sleep apnea    does not use mask    Thyroid cancer ([place])    Hertle Cell    Patient Active Problem List   Diagnosis Date Noted   Inappropriate behavior 11/04/2020   Neck pain without injury 08/05/2020   Left foot pain 08/05/2020   Heart failure with reduced ejection fraction ([place]) 04/30/2020   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 88/41/6606   Colicky RUQ abdominal pain 11/30/2018   Pain in joint of left shoulder 07/05/2018   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 07/05/2018   Hip region mass, left 06/24/2018   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, bilateral 05/22/2018   Inguinal hernia 02/18/2018   Chronic kidney disease (CKD), stage II (mild) 06/06/2017   S/P tonsillectomy 06/06/2017   S/P rotator cuff surgery 06/06/2017   S/P partial thyroidectomy 06/06/2017   Solitary kidney 06/06/2017   Failure of implantable cardioverter-defibrillator (ICD) lead 11/07/2016   Erectile dysfunction  04/18/2016   Pain in the chest    Aneurysm of descending thoracic aorta    Nasal folliculitis 45/36/4680   Acute on chronic systolic CHF (congestive heart failure) ([place]) 04/12/2015   CKD (chronic kidney disease) stage 3, GFR 30-59 ml/min (HCC) 04/12/2015   Unstable angina ([place]) 01/25/2015   Chest pain 10/10/2014   Health care maintenance 03/19/2014   Local reaction to tetanus vaccine 03/19/2014   Neck pain on left side 06/03/2013   Penile discharge 02/26/2013   Syncope 01/17/2013   Chronic combined systolic and diastolic CHF (congestive heart failure) ([place]) 01/16/2013   Low back pain 04/23/2012   Chest pain syndrome    Insomnia 09/14/2011   Thyroid cancer, 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   Bradycardia 05/03/2011   Aortic  dissection, thoracoabdominal ([place])    ORTHOSTATIC HYPOTENSION 05/03/2010   CAROTID BRUIT 01/25/2010   PANCYTOPENIA 11/01/2009   Fatigue 10/25/2009   DISSECTING AORTIC ANEURYSM THORACOABDOMINAL 07/14/2009   Sciatica of right side 05/18/2009   Hyperlipidemia 09/29/2008   Hypertension, benign 09/29/2008   CAD, ARTERY BYPASS GRAFT 32/11/2481   SYSTOLIC HEART FAILURE, CHRONIC 09/29/2008   Implantable cardioverter-defibrillator (ICD) in situ 09/29/2008   CAD (coronary artery disease) of artery bypass graft 09/29/2008    Past Surgical History:  Procedure Laterality Date   BACK SURGERY     CARDIAC CATHETERIZATION     "several" (11/07/2016)   CARDIAC DEFIBRILLATOR PLACEMENT  11/2005   [employer]; Archie Endo 05/09/2011   CHOLECYSTECTOMY N/A 12/09/2018   Procedure: LAPAROSCOPIC CHOLECYSTECTOMY;  Surgeon: Ralene Ok, MD;  Location: [facility];  Service: General;  Laterality: N/A;   CORONARY ANGIOPLASTY WITH STENT PLACEMENT     "I've had 1-2 stents" (11/07/2016)   CORONARY ARTERY BYPASS GRAFT  06/21/2009   "CABG X2"   CORONARY ARTERY BYPASS GRAFT  06/29/2005   "CABG X7"   ICD LEAD REMOVAL  11/07/2016   ICD LEAD REMOVAL N/A 11/07/2016   Procedure: ICD LEAD REMOVAL, INSERTION OF NEW ICD LEAD;  Surgeon: Evans Lance, MD;  Location: [place];  Service: Cardiovascular;  Laterality: N/A;  Dr. Prescott Gum to backup case   IMPLANTABLE CARDIOVERTER DEFIBRILLATOR (ICD) GENERATOR CHANGE N/A 12/13/2012   Procedure: ICD GENERATOR CHANGE;  Surgeon: Evans Lance, MD;  Location: [facility] CATH LAB;  Service: Cardiovascular;  Laterality: N/A;   INGUINAL HERNIA REPAIR Bilateral 06/25/2018   Procedure: LAPAROSCOPIC  BILATERAL INGUINAL HERNIA REPAIRS;  Surgeon: Ralene Ok, MD;  Location: [place];  Service: General;  Laterality: Bilateral;   INSERTION OF MESH Bilateral 06/25/2018   Procedure: INSERTION OF MESH;  Surgeon: Ralene Ok, MD;  Location: [place];  Service: General;  Laterality: Bilateral;   [place] SURGERY  01/2001    most recent within 5-10 years   RIGHT HEART CATH N/A 04/05/2018   Procedure: RIGHT HEART CATH;  Surgeon: Jolaine Artist, MD;  Location: [place] CV LAB;  Service: Cardiovascular;  Laterality: N/A;   RIGHT HEART CATH N/A 01/19/2020   Procedure: RIGHT HEART CATH;  Surgeon: Jolaine Artist, MD;  Location: [place] CV LAB;  Service: Cardiovascular;  Laterality: N/A;   SHOULDER ARTHROSCOPY WITH ROTATOR CUFF REPAIR Bilateral    Status post emergency repair of a type A ascending aortic dissection with a hemiarch reconstruction of the ascending aorta  using a 28-mm Hemashield graft with redo sternotomy and revision of previous bypass grafts in June 2010.     TESTICLE SURGERY     THYROIDECTOMY, PARTIAL  06/20/2011  TONSILLECTOMY     UMBILICAL HERNIA REPAIR N/A 06/25/2018   Procedure: UMBILICAL HERNIA REPAIR;  Surgeon: Ralene Ok, MD;  Location: [place];  Service: General;  Laterality: N/A;   VASECTOMY         Family History  Problem Relation Age of Onset   Hypertension Father    Heart disease Father    Early death Father    COPD Father    Hypertension Mother    Coronary artery disease Other     Social History   Tobacco Use   Smoking status: Never   Smokeless tobacco: Never  Vaping Use   Vaping Use: Never used  Substance Use Topics   Alcohol use: No   Drug use: No    Home Medications Prior to Admission medications   Medication Sig Start Date End Date Taking? Authorizing Provider  acetaminophen (TYLENOL) 325 MG tablet Take 2 tablets (650 mg total) by mouth every 4 (four) hours as needed for headache or mild pain. 05/03/20   Clegg, Amy D, NP  Alirocumab (PRALUENT) 75 MG/ML SOAJ Inject 75 mg into the skin every 14 (fourteen) days. Patient not taking: No sig reported 09/03/20   Bensimhon, Shaune Pascal, MD  allopurinol (ZYLOPRIM) 100 MG tablet Take 1 tablet (100 mg total) by mouth daily. 10/18/21   Bensimhon, Shaune Pascal, MD  alum & mag hydroxide-simeth  (MAALOX/MYLANTA) 200-200-20 MG/5ML suspension Take 15 mLs by mouth every 6 (six) hours as needed for indigestion or heartburn. Patient not taking: No sig reported 05/03/20   Darrick Grinder D, NP  aspirin EC 81 MG tablet Take 81 mg by mouth every evening.    [provider]  carvedilol (COREG) 6.25 MG tablet Take 1 tablet (6.25 mg total) by mouth 2 (two) times daily with a meal. If systolic is less than 90 you may hold Coreg until systolic is greater than 90, then resume taking 10/21/21   Bensimhon, Shaune Pascal, MD  colchicine 0.6 MG tablet Take 1 tablet (0.6 mg total) by mouth daily. 11/03/21   Bensimhon, Shaune Pascal, MD  FARXIGA 5 MG TABS tablet Take 5 mg by mouth every morning. 02/15/21   [provider]  fluticasone (FLONASE) 50 MCG/ACT nasal spray Place 2 sprays into both nostrils daily as needed for allergies or rhinitis.     [provider]  gabapentin (NEURONTIN) 300 MG capsule Take 1 capsule (300 mg total) by mouth at bedtime. Patient taking differently: Take 300 mg by mouth 3 (three) times daily. 08/25/20   Bensimhon, Shaune Pascal, MD  hydrALAZINE (APRESOLINE) 25 MG tablet Take 100 mg by mouth 3 (three) times daily. 01/19/21   [provider]  isosorbide mononitrate (IMDUR) 30 MG 24 hr tablet Take 30 mg by mouth daily. 03/06/21   [provider]  KLOR-CON M20 20 MEQ tablet TAKE 1 TABLET BY MOUTH TWICE DAILY. TAKE  AN  EXTRA  2  TABLETS  WHEN  YOU  TAKE  METOLAZONE 11/28/21   Bensimhon, Shaune Pascal, MD  methocarbamol (ROBAXIN) 750 MG tablet Take 750 mg by mouth daily. 02/24/21   [provider]  nitroGLYCERIN (NITROSTAT) 0.4 MG SL tablet Place 1 tablet (0.4 mg total) under the tongue every 5 (five) minutes as needed for chest pain. May take if diastolic BP is > 90 4/53/64   Bensimhon, Shaune Pascal, MD  Oxycodone HCl 20 MG TABS Take 20 mg by mouth every 6 (six) hours as needed (For pain).  12/22/19   [provider]  potassium  chloride (KLOR-CON) 20 MEQ packet  Take 20 mEq by mouth as directed.    [provider]  spironolactone (ALDACTONE) 25 MG tablet Take 1 tablet by mouth daily. 12/15/20 12/15/21  [provider]  torsemide (DEMADEX) 20 MG tablet Take 1 tablet (20 mg total) by mouth every Monday, Wednesday, and Friday. 04/04/21   Bensimhon, Shaune Pascal, MD  warfarin (COUMADIN) 2 MG tablet Take by mouth. 7 mg daily per [facility]/Heart Failure Team 12/15/20 12/15/21  [provider]    Allergies    Contrast media [iodinated diagnostic agents], Iohexol, Lipitor [atorvastatin calcium], Shellfish allergy, Sulfa antibiotics, Sulfonamide derivatives, Almond oil, Metrizamide, Zocor [simvastatin], Food, Latex, and Peach [prunus persica]  Review of Systems   Review of Systems  Constitutional:  Negative for fever.  HENT:  Negative for ear pain and sore throat.   Eyes:  Negative for pain.  Respiratory:  Negative for cough.   Cardiovascular:  Positive for chest pain.  Gastrointestinal:  Negative for abdominal pain.  Genitourinary:  Negative for flank pain.  Musculoskeletal:  Negative for back pain.  Skin:  Negative for color change and rash.  Neurological:  Negative for syncope.  All other systems reviewed and are negative.  Physical Exam Updated Vital Signs BP (!) 84/55 (BP Location: Left Arm)    Pulse 91    Temp 98.8 F (37.1 C)    Resp 20    SpO2 94%   Physical Exam Constitutional:      Appearance: He is well-developed.  HENT:     Head: Normocephalic.     Nose: Nose normal.  Eyes:     Extraocular Movements: Extraocular movements intact.  Cardiovascular:     Rate and Rhythm: Normal rate.  Pulmonary:     Effort: Pulmonary effort is normal.  Abdominal:     Tenderness: There is no abdominal tenderness. There is no guarding or rebound.  Skin:    Coloration: Skin is not jaundiced.  Neurological:     Mental Status: He is alert. Mental status is at baseline.    ED Results / Procedures / Treatments   Labs (all  labs ordered are listed, but only abnormal results are displayed) Labs Reviewed  BASIC METABOLIC PANEL - Abnormal; Notable for the following components:      Result Value   Glucose, Bld 104 (*)    Creatinine, Ser 1.42 (*)    Calcium 8.6 (*)    GFR, Estimated 57 (*)    All other components within normal limits  CBC - Abnormal; Notable for the following components:   RBC 4.21 (*)    Hemoglobin 11.6 (*)    HCT 37.4 (*)    Platelets 98 (*)    All other components within normal limits  TROPONIN I (HIGH SENSITIVITY) - Abnormal; Notable for the following components:   Troponin I (High Sensitivity) 20 (*)    All other components within normal limits  TROPONIN I (HIGH SENSITIVITY)    EKG EKG Interpretation  Date/Time:  Saturday December 10 2021 05:35:24 EST Ventricular Rate:  78 PR Interval:  156 QRS Duration: 131 QT Interval:  401 QTC Calculation: 457 R Axis:   253 Text Interpretation: Sinus rhythm Atrial premature complex Probable left atrial enlargement Nonspecific IVCD with LAD Inferior infarct, acute (RCA) Consider anterolateral infarct Probable RV involvement, suggest recording right precordial leads Artifact in lead(s) I II III aVR aVL aVF V1 V5 V6 Confirmed by Thamas Jaegers ([id number]) on 12/10/2021 5:45:59 AM  Radiology DG Chest Port 1  View  Result Date: 12/10/2021 CLINICAL DATA:  60 year old male with history of severe chest pain starting 30 minutes ago. Shortness of breath and nausea. EXAM: PORTABLE CHEST 1 VIEW COMPARISON:  Chest x-ray 09/02/2021. FINDINGS: Lung volumes are normal. No definite pleural effusions. No pneumothorax. No evidence of pulmonary edema. Heart size is moderately enlarged. Upper mediastinal contours are grossly distorted, with persistent aneurysmal dilatation of the proximal descending thoracic aorta in this patient status post aortic stent graft placement. The overall appearance of this is very similar to the prior examination from 09/02/2021. Left ventricular  assist device projecting over the apex of the heart. Status post median sternotomy for CABG and aortic valve replacement with a stented bioprosthesis. Left-sided pacemaker/AICD in place with lead tip projecting over the expected location of the right ventricular apex. IMPRESSION: 1. Radiographic appearance the chest is unchanged, as detailed above. No definite acute findings are noted on today's examination. Electronically Signed   By: Vinnie Langton M.D.   On: 12/10/2021 06:09    Procedures Procedures   Medications Ordered in ED Medications  sodium chloride 0.9 % bolus 1,000 mL (has no administration in time range)  acetaminophen (TYLENOL) tablet 650 mg (650 mg Oral Given 12/10/21 0745)    ED Course  I have reviewed the triage vital signs and the nursing notes.  Pertinent labs & imaging results that were available during my care of the patient were reviewed by me and considered in my medical decision making (see chart for details).    MDM Rules/Calculators/A&P                         Labs sent, white count of 4 hemoglobin of 11.6.  Chemistry unremarkable.  Initial troponin is at his baseline level of 20.  Patient's blood pressure found to be on the lower side with a systolic of 38/45.  Given a liter bolus of fluids.  I spoke with our LVAD coordinator here who recommended work-up and to contact [facility] as well as our cardiologists.  I spoke with the [facility] transfer center and discussed the case with their cardiothoracic surgeon who will call me back in the meanwhile noncontrast CT has been ordered.  I also spoke with our cardiologist Dr.[name], who recommended we follow-up recommendations from [facility] wants to call us back.  I spoke with the LVAD coordinators at [facility], patient was accepted for transfer to the hospital by Dr.Mentz.    Final Clinical Impression(s) / ED Diagnoses Final diagnoses:  Chest pain, unspecified type  Hypotension, unspecified hypotension type    Rx / DC  Orders ED Discharge Orders     None        Luna Fuse, MD 12/10/21 3646    Luna Fuse, MD 12/10/21 [phone]

## 2021-12-10 NOTE — ED Notes (Signed)
Attempted to contact [facility] RN to provide report. Unable to make contact. X1

## 2021-12-10 NOTE — ED Provider Notes (Signed)
Eval for CP in LVAD patient.Multiple consults done by Dr. Almyra Free with LVAD team at [facility] and CT surgeon at [facility]. BPs persisting in the 80s.Accepted by Dr. Evalee Mutton at [facility] for admission, still need to f/u on noncon CT chest. Physical Exam  BP (!) 84/55 (BP Location: Left Arm)    Pulse 91    Temp 98.8 F (37.1 C)    Resp 20    SpO2 94%   Physical Exam  ED Course/Procedures     Procedures  MDM  Awaiting transfer 08: 50 patient reports he is having more chest pain again.  Diffuse through the chest.  He reports it had eased up previously he thinks is coming and going on its own.  He wants to drive by private vehicle to [facility] for his admission.  Patient advised strongly against going by private vehicle with significant medical history and active chest pain.  At this time we will start small dose of morphine to titrate to pain as tolerated.  Nursing is trying to arrange transport ASAP.      Charlesetta Shanks, MD 12/10/21 [phone]

## 2021-12-10 NOTE — ED Triage Notes (Signed)
Pt states that he began to have severe CP starting about 30 minutes ago, pt has an LVAD, SOB and some nausea.

## 2021-12-10 NOTE — ED Notes (Signed)
The pt reports that his chest pain is better.  He has had nasal 02 since he arrived in the treatment room

## 2021-12-11 DIAGNOSIS — I517 Cardiomegaly: Secondary | ICD-10-CM | POA: Diagnosis not present

## 2021-12-11 DIAGNOSIS — J90 Pleural effusion, not elsewhere classified: Secondary | ICD-10-CM | POA: Diagnosis not present

## 2021-12-11 DIAGNOSIS — R079 Chest pain, unspecified: Secondary | ICD-10-CM | POA: Diagnosis not present

## 2021-12-11 DIAGNOSIS — Z953 Presence of xenogenic heart valve: Secondary | ICD-10-CM | POA: Diagnosis not present

## 2021-12-11 DIAGNOSIS — I712 Thoracic aortic aneurysm, without rupture, unspecified: Secondary | ICD-10-CM | POA: Diagnosis not present

## 2021-12-11 DIAGNOSIS — Z95811 Presence of heart assist device: Secondary | ICD-10-CM | POA: Diagnosis not present

## 2021-12-11 DIAGNOSIS — I5022 Chronic systolic (congestive) heart failure: Secondary | ICD-10-CM | POA: Diagnosis not present

## 2021-12-11 DIAGNOSIS — N182 Chronic kidney disease, stage 2 (mild): Secondary | ICD-10-CM | POA: Diagnosis not present

## 2021-12-12 ENCOUNTER — Telehealth: Payer: Self-pay

## 2021-12-12 NOTE — Telephone Encounter (Signed)
Transition Care Management Unsuccessful Follow-up Telephone Call  Date of discharge and from where:  12/11/2021-[facility]  Attempts:  1st Attempt  Reason for unsuccessful TCM follow-up call:  Left voice message

## 2021-12-13 NOTE — Telephone Encounter (Signed)
Transition Care Management Follow-up Telephone Call Date of discharge and from where: 12/11/2021 from [facility] How have you been since you were released from the hospital? Heart is increased and it causes SOB. Any questions or concerns? No  Items Reviewed: Did the pt receive and understand the discharge instructions provided? Yes  Medications obtained and verified? Yes  Other? No  Any new allergies since your discharge? No  Dietary orders reviewed? No Do you have support at home? Yes   Functional Questionnaire: (I = Independent and D = Dependent) ADLs: I  Bathing/Dressing- I  Meal Prep- I  Eating- I  Maintaining continence- I  Transferring/Ambulation- I  Managing Meds- I   Follow up appointments reviewed:  PCP Hospital f/u appt confirmed? No   Specialist Hospital f/u appt confirmed? Yes  Scheduled to see [facility] V[facility] on 12/27/2021 @ 9:00am. Are transportation arrangements needed? No  If their condition worsens, is the pt aware to call PCP or go to the Emergency Dept.? Yes Was the patient provided with contact information for the PCP's office or ED? Yes Was to pt encouraged to call back with questions or concerns? Yes

## 2021-12-20 DIAGNOSIS — Z95811 Presence of heart assist device: Secondary | ICD-10-CM | POA: Diagnosis not present

## 2021-12-20 DIAGNOSIS — Z7901 Long term (current) use of anticoagulants: Secondary | ICD-10-CM | POA: Diagnosis not present

## 2021-12-22 ENCOUNTER — Other Ambulatory Visit (HOSPITAL_COMMUNITY): Payer: Self-pay | Admitting: *Deleted

## 2021-12-22 DIAGNOSIS — Z95811 Presence of heart assist device: Secondary | ICD-10-CM

## 2021-12-22 DIAGNOSIS — G894 Chronic pain syndrome: Secondary | ICD-10-CM | POA: Diagnosis not present

## 2021-12-22 DIAGNOSIS — I5022 Chronic systolic (congestive) heart failure: Secondary | ICD-10-CM

## 2021-12-22 DIAGNOSIS — Z7901 Long term (current) use of anticoagulants: Secondary | ICD-10-CM

## 2021-12-25 DIAGNOSIS — Z419 Encounter for procedure for purposes other than remedying health state, unspecified: Secondary | ICD-10-CM | POA: Diagnosis not present

## 2021-12-27 ENCOUNTER — Ambulatory Visit (HOSPITAL_COMMUNITY)
Admission: RE | Admit: 2021-12-27 | Discharge: 2021-12-27 | Disposition: A | Discharge status: Home / Self Care | Payer: Medicaid Other | Source: Ambulatory Visit | Attending: Internal Medicine | Admitting: Internal Medicine

## 2021-12-27 ENCOUNTER — Encounter (HOSPITAL_COMMUNITY): Payer: Self-pay

## 2021-12-27 ENCOUNTER — Other Ambulatory Visit: Payer: Self-pay

## 2021-12-27 ENCOUNTER — Other Ambulatory Visit (HOSPITAL_COMMUNITY): Payer: Self-pay | Admitting: Internal Medicine

## 2021-12-27 ENCOUNTER — Ambulatory Visit (INDEPENDENT_AMBULATORY_CARE_PROVIDER_SITE_OTHER): Payer: Medicaid Other

## 2021-12-27 VITALS — BP 124/70 | HR 80 | Temp 98.1°F | Ht 71.0 in | Wt 189.2 lb

## 2021-12-27 DIAGNOSIS — I10 Essential (primary) hypertension: Secondary | ICD-10-CM

## 2021-12-27 DIAGNOSIS — I5022 Chronic systolic (congestive) heart failure: Secondary | ICD-10-CM | POA: Diagnosis not present

## 2021-12-27 DIAGNOSIS — I5042 Chronic combined systolic (congestive) and diastolic (congestive) heart failure: Secondary | ICD-10-CM | POA: Diagnosis not present

## 2021-12-27 DIAGNOSIS — Z95811 Presence of heart assist device: Secondary | ICD-10-CM | POA: Diagnosis not present

## 2021-12-27 DIAGNOSIS — Z95810 Presence of automatic (implantable) cardiac defibrillator: Secondary | ICD-10-CM | POA: Diagnosis not present

## 2021-12-27 DIAGNOSIS — Z951 Presence of aortocoronary bypass graft: Secondary | ICD-10-CM | POA: Insufficient documentation

## 2021-12-27 DIAGNOSIS — Z4502 Encounter for adjustment and management of automatic implantable cardiac defibrillator: Secondary | ICD-10-CM | POA: Diagnosis not present

## 2021-12-27 DIAGNOSIS — D509 Iron deficiency anemia, unspecified: Secondary | ICD-10-CM | POA: Insufficient documentation

## 2021-12-27 DIAGNOSIS — N1831 Chronic kidney disease, stage 3a: Secondary | ICD-10-CM | POA: Insufficient documentation

## 2021-12-27 DIAGNOSIS — R002 Palpitations: Secondary | ICD-10-CM

## 2021-12-27 DIAGNOSIS — I130 Hypertensive heart and chronic kidney disease with heart failure and stage 1 through stage 4 chronic kidney disease, or unspecified chronic kidney disease: Secondary | ICD-10-CM | POA: Diagnosis not present

## 2021-12-27 DIAGNOSIS — I252 Old myocardial infarction: Secondary | ICD-10-CM | POA: Diagnosis not present

## 2021-12-27 DIAGNOSIS — Z79899 Other long term (current) drug therapy: Secondary | ICD-10-CM | POA: Insufficient documentation

## 2021-12-27 DIAGNOSIS — I2510 Atherosclerotic heart disease of native coronary artery without angina pectoris: Secondary | ICD-10-CM | POA: Diagnosis not present

## 2021-12-27 DIAGNOSIS — Z7901 Long term (current) use of anticoagulants: Secondary | ICD-10-CM | POA: Diagnosis not present

## 2021-12-27 DIAGNOSIS — I472 Ventricular tachycardia, unspecified: Secondary | ICD-10-CM | POA: Diagnosis not present

## 2021-12-27 DIAGNOSIS — I255 Ischemic cardiomyopathy: Secondary | ICD-10-CM | POA: Diagnosis not present

## 2021-12-27 DIAGNOSIS — I4729 Other ventricular tachycardia: Secondary | ICD-10-CM | POA: Diagnosis not present

## 2021-12-27 DIAGNOSIS — Z7682 Awaiting organ transplant status: Secondary | ICD-10-CM | POA: Diagnosis not present

## 2021-12-27 LAB — BASIC METABOLIC PANEL
Anion gap: 8 (ref 5–15)
BUN: 23 mg/dL — ABNORMAL HIGH (ref 6–20)
CO2: 28 mmol/L (ref 22–32)
Calcium: 9 mg/dL (ref 8.9–10.3)
Chloride: 99 mmol/L (ref 98–111)
Creatinine, Ser: 1.66 mg/dL — ABNORMAL HIGH (ref 0.61–1.24)
GFR, Estimated: 47 mL/min — ABNORMAL LOW (ref >60)
Glucose, Bld: 96 mg/dL (ref 70–99)
Potassium: 4.1 mmol/L (ref 3.5–5.1)
Sodium: 135 mmol/L (ref 135–145)

## 2021-12-27 LAB — PROTIME-INR
INR: 1.9 — ABNORMAL HIGH (ref 0.8–1.2)
Prothrombin Time: 21.9 seconds — ABNORMAL HIGH (ref 11.4–15.2)

## 2021-12-27 LAB — CBC
HCT: 38.5 % — ABNORMAL LOW (ref 39.0–52.0)
Hemoglobin: 12.1 g/dL — ABNORMAL LOW (ref 13.0–17.0)
MCH: 27.1 pg (ref 26.0–34.0)
MCHC: 31.4 g/dL (ref 30.0–36.0)
MCV: 86.1 fL (ref 80.0–100.0)
Platelets: 131 10*3/uL — ABNORMAL LOW (ref 150–400)
RBC: 4.47 MIL/uL (ref 4.22–5.81)
RDW: 14.6 % (ref 11.5–15.5)
WBC: 3.4 10*3/uL — ABNORMAL LOW (ref 4.0–10.5)
nRBC: 0 % (ref 0.0–0.2)

## 2021-12-27 LAB — LACTATE DEHYDROGENASE: LDH: 196 U/L — ABNORMAL HIGH (ref 98–192)

## 2021-12-27 NOTE — Patient Instructions (Signed)
No change in medications. Placed 14 day monitor (Zio patch) today; please follow instructions. Will send INR results to Lennette Bihari at [facility] for warfarin management. Return to [facility] in 3 months.

## 2021-12-27 NOTE — Progress Notes (Signed)
Patient presents for 3 month f/u in [facility] today alone. Reports no problems with VAD equipment or concerns with drive line.   Recent admission to [facility] 12/17 - 12/11/21 for NSTEMI due to acute thrombus. Per note, TAA with aortic valve thrombus. INR goal was increased to 2.5 - 3.5. His Imdur was increased to 60 mg daily in hopes to decrease chest pain. He reports he has seen no real improvement.  Pt c/o occassional heart palpitations, describing as occurring at rest and with activity. BS ICD interrogated and result reviewed by Dr. Haroldine Laws. Pt has had several NSVT episodes in December, all lasting 16 secs or less. 14 day Zio patch placed by [employer], RN.   Patient spoke with Dr. Haroldine Laws about possible upcoming trip out of the country. Per Dr. Haroldine Laws, patient should not go at this time. No VAD support in the area.   Vital Signs:  Temp: 98.1 Doppler Pressure: 90 Automatc BP: 124/70 (93) HR: 80 SR  SPO2: 97% on RA   Weight: 189.2 lb w/ eqt Last weight: 202 lb w/ eqt (discharge weight from [facility] admission) Home weights: does not weigh daily   VAD Indication: Destination Therapy - HM III implanted at [facility] 06/04/20   VAD interrogation: Speed: 5800 Flow: 4.9 Power: 4.6w    PI: 3.7 Alarms: none Events: 20 - 30 PI events over last two days; 60+ on 12/25/20 Hct 34  Fixed speed: 5800 Low speed limit: 5600 - set by [facility]  I reviewed the LVAD parameters from today and compared the results to the patient's prior recorded data. LVAD interrogation was NEGATIVE for significant power changes, NEGATIVE for clinical alarms and POSITIVE for PI events/speed drops. No programming changes were made and pump is functioning within specified parameters. Pt is performing daily controller and system monitor self tests along with completing weekly and monthly maintenance for LVAD equipment.   LVAD equipment check completed and is in good working order. Back-up equipment present.   Annual Equipment  Maintenance on UBC/PM maintained by [facility].   Exit Site Care: VAD dressing maintained weekly by patient. Dressing clean, dry, intact. Anchor in place and correctly applied. Exit site well healed and incorporated.The velour is fully implanted at exit site. No redness, tenderness, drainage, or foul odor noted. Pt denies fever or chills. Pt obtains dressing supplies from [facility]. States he has adequate dressing supplies at home.    Device: [employer] single Therapies: VF 250 VT 210         VT 185 (monitor)  Last check:  05/09/21; Latitude performed today - see above note   BP & Labs:  MAP 90 - Doppler is reflecting MAP   Hgb 12.1 - No S/S of bleeding. Specifically denies melena/BRBPR or nosebleeds.   LDH stable at 196 with established baseline of 150-250. Denies tea-colored urine. No power elevations noted on interrogation.   INR 1.9 today. His INR is managed by [facility]. I routed all lab results to Lyda Perone NP at [facility].   Patient Instructions: No change in medications. Placed 14 day monitor (Zio patch) today; please follow instructions. Will send INR results to Lennette Bihari at [facility] for warfarin management. Return to [facility] in 3 months.   Zada Girt RN [place] Coordinator  Office: [phone]  24/7 Pager: [phone]

## 2021-12-27 NOTE — Progress Notes (Signed)
Advanced Heart Failure Clinic Note   Patient ID: Chad [name]., male   DOB: [birth year], 61 y.o.   MRN: [id number]   Primary Cardiologist:  Dr. Glori Bickers   History of Present Illness:  Chad [name] is a 61 y.o. male with aevere HTN, CAD s/p MI with CABG 2006 and DES to native PDA in 4235, systolic HF EF 36-14% s/p BOsSCI ICD. 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  Underwent high risk HM-III VAD placement at [facility] 6/21.   Echo 3/22 EF 20% RV moderate HK.  RHC with exercise 3/22 on 5600  Rest RA 9 PCWP 12 CI 2.4 MAP 100 Exercise PCWP 27 CI 2.5 pVO2 7.4 slope 42  Here for VAD f/u. Remains listed at [facility]. Admitted to [facility] 12/22 with CP. Trops 105->149->158. CT TAA with stable findings from prior including aortic valve thrombus. INR goal increased to 2.5 - 3.5. Says he feels ok. Having a lot of palpitations. Taking torsemide 20mg  MWF + extra as needed. Adjusting hydralazine as needed for his BP. Denies orthopnea or PND. No fevers, chills or problems with driveline. No bleeding, melena or neuro symptoms. No VAD alarms. Taking all meds as prescribed.   ICD interrogated. Infrequent brief NSVT. No shocks. Personally reviewed     VAD Indication: Destination Therapy - HM III implanted at [facility] 06/04/20   VAD interrogation: Speed: 5800 Flow: 5.0 Power: 4.7w    PI: 3.1 Alarms: none Events: 20 PI events so far today, 100 + yesterday Hct 34   Fixed speed: 5800 Low speed limit: 5600 - set by [facility]   Primary controller: back up battery due for replacement in 17 months Secondary controller:  back up battery due for replacement in  months     I reviewed the LVAD parameters from today and compared the results to the patient's prior recorded data. LVAD interrogation  was NEGATIVE for significant power changes, NEGATIVE for clinical alarms and POSITIVE for PI events/speed drops. No programming changes were made and pump is functioning within specified parameters. Pt is performing daily controller and system monitor self tests along with completing weekly and monthly maintenance for LVAD equipment.   LVAD equipment check completed and is in good working order. Back-up equipment present.   Annual Equipment Maintenance on UBC/PM maintained by [facility].     Review of systems complete and found to be negative unless listed in HPI.   Past Medical History:  Diagnosis Date   AICD (automatic cardioverter/defibrillator) present    Anemia    Anginal pain ([place])    Anxiety    Aortic dissection, thoracoabdominal ([place])    7/10: Type I s/p repair   Arthritis    Asthma    CAD (coronary artery disease)    a. s/p CABG 2006;  b. DES to PDA 2011 (cath: Dx not seen, dRCA/PDA tx with DES; S-PDA occluded (culprit), S-Dx occluded, S-RI and OM ok, L-LAD ok   Carotid stenosis    dopplers 2011: 0-39% bilat.   Chest pain syndrome    CHF (congestive heart failure) ([place])    06/14/21 LVAD placemnt [facility]   Chronic bronchitis (HCC)    Chronic lower back pain    Chronic systolic heart failure ([place])    a. 12/13 ECHO: EF 35-40%, sept, apical & posterobasal HK,  LV mod dil & sys fx mod reduced, mild AI, MV mild reg, TV mild reg   Complication of anesthesia    "difficult to wake afterwards a couple of times" and patient states he has woken during surgery    CRI (chronic renal insufficiency)    "one kidney is gone; the other is hanging on" (11/07/2016)   Dyspnea    patient denies at 12/06/2018 appt    Family history of adverse reaction to anesthesia    "sister hard to wake up"   GERD (gastroesophageal reflux disease)    Gout    Heart murmur    HLD (hyperlipidemia)    HTN (hypertension)    severe   Myocardial infarction [facility])    "many" (11/07/2016)   PONV (postoperative nausea and  vomiting)    Sleep apnea    does not use mask    Thyroid cancer (HCC)    Hertle Cell    Current Outpatient Medications  Medication Sig Dispense Refill   acetaminophen (TYLENOL) 325 MG tablet Take 2 tablets (650 mg total) by mouth every 4 (four) hours as needed for headache or mild pain.     Alirocumab (PRALUENT) 75 MG/ML SOAJ Inject 75 mg into the skin every 14 (fourteen) days. 2 mL 11   allopurinol (ZYLOPRIM) 100 MG tablet Take 1 tablet (100 mg total) by mouth daily. 30 tablet 5   alum & mag hydroxide-simeth (MAALOX/MYLANTA) 200-200-20 MG/5ML suspension Take 15 mLs by mouth every 6 (six) hours as needed for indigestion or heartburn. 355 mL 0   aspirin EC 81 MG tablet Take 81 mg by mouth every evening.     carvedilol (COREG) 6.25 MG tablet Take 1 tablet (6.25 mg total) by mouth 2 (two) times daily with a meal. If systolic is less than 90 you may hold Coreg until systolic is greater than 90, then resume taking 60 tablet 5   colchicine 0.6 MG tablet Take 1 tablet (0.6 mg total) by mouth daily. 30 tablet 11   FARXIGA 5 MG TABS tablet Take 5 mg by mouth every morning.     fluticasone (FLONASE) 50 MCG/ACT nasal spray Place 2 sprays into both nostrils daily as needed for allergies or rhinitis.      gabapentin (NEURONTIN) 300 MG capsule Take 1 capsule (300 mg total) by mouth at bedtime. (Patient taking differently: Take 300 mg by mouth 3 (three) times daily.) 30 capsule 3   hydrALAZINE (APRESOLINE) 25 MG tablet Take 100 mg by mouth 3 (three) times daily.     isosorbide mononitrate (IMDUR) 30 MG 24 hr tablet Take 60 mg by mouth daily.     KLOR-CON M20 20 MEQ tablet TAKE 1 TABLET BY MOUTH TWICE DAILY. TAKE  AN  EXTRA  2  TABLETS  WHEN  YOU  TAKE  METOLAZONE 135 tablet 0   methocarbamol (ROBAXIN) 750 MG tablet Take 750 mg by mouth daily.     nitroGLYCERIN (NITROSTAT) 0.4 MG SL tablet Place 1 tablet (0.4 mg total) under the tongue every 5 (five) minutes as needed for chest pain. May take if diastolic BP  is > 90 30 tablet 3   Oxycodone HCl 20 MG TABS Take 20 mg by mouth every 6 (six) hours as needed (For pain).      sacubitril-valsartan (ENTRESTO) 97-103 MG Take 1 tablet by mouth 2 (two) times daily.     spironolactone (ALDACTONE) 25 MG tablet Take 25 mg by mouth daily.     torsemide (DEMADEX) 20 MG tablet  Take 1 tablet (20 mg total) by mouth every Monday, Wednesday, and Friday. 60 tablet 4   warfarin (COUMADIN) 4 MG tablet Take 8 mg by mouth daily.     potassium chloride (KLOR-CON) 20 MEQ packet Take 20 mEq by mouth as directed. (Patient not taking: Reported on 12/27/2021)     No current facility-administered medications for this encounter.    Allergies  Allergen Reactions   Contrast Media [Iodinated Contrast Media] Anaphylaxis   Iohexol Anaphylaxis and Other (See Comments)    PT HAS ANAPHYLAXIS WITH CONTRAST MEDIA!   Lipitor [Atorvastatin Calcium] Anaphylaxis and Other (See Comments)    Large doses   Shellfish Allergy Anaphylaxis   Sulfa Antibiotics Shortness Of Breath and Swelling   Sulfonamide Derivatives Shortness Of Breath and Swelling   Almond Oil Itching    FACIAL/MOUTH ITCHING   Metrizamide Swelling    SWELLING REACTION UNSPECIFIED    Zocor [Simvastatin] Other (See Comments)    Muscle cramps   Food Itching    Shellfish, peaches, almonds, apples & kiwis   Latex Rash and Other (See Comments)    With long periods of exposure   Peach [Prunus Persica] Itching    Vital Signs:  Wt Readings from Last 3 Encounters:  09/12/21 82.8 kg (182 lb 9.6 oz)  09/02/21 90 kg (198 lb 6.6 oz)  07/29/21 81.6 kg (179 lb 14.3 oz)     Vital Signs:  Doppler Pressure: 96 Automatc BP: 110/66 (85) HR: 95 SR w/ PVCs SPO2: 98%   Weight: 182.6 lb w/ eqt Last weight: 178.8 lb w/ eqt Home weights: 180 - 181 lbs   Exam: General:  NAD.  HEENT: normal  Neck: supple. JVP 8-9  Carotids 2+ bilat; no bruits. No lymphadenopathy or thryomegaly appreciated. Cor: LVAD hum.  Lungs: Clear. Abdomen:  soft, nontender, non-distended. No hepatosplenomegaly. No bruits or masses. Good bowel sounds. Driveline site clean. Anchor in place.  Extremities: no cyanosis, clubbing, rash. Warm no edema  Neuro: alert & oriented x 3. No focal deficits. Moves all 4 without problem    ASSESSMENT/PLAN:  1. Chronic systolic HF: Ischemic cardiomyopathy.  Echo (2/16) with EF 25%. S/p [employer] ICD. Echo 3/18. EF 20-25%. Echo 04/18/18: EF 20-25%, grade 2 DD. - s/p HM-III VAD at [facility] 6/21 - Echo and RHC at [facility] 3/22 (details above) - Improved NYHA II now that volume status better managed - Volume status appears mildly elevated on exam but having a lot of PI events recently so will not push diuresis. Continue torsemide 20 mg MWF. Can take extra as needed - Continue Entresto 97/103 bid - Continue spiro 25 daily - Continue Farxiga 5 daily - Continue carvedilol 6.25 bid  - Continue hydral 100 tid and Imdur 30 daily - Currently on [facility] at [facility]  2. CAD s/p CABG 2006:  - stable - no significant s/s angina  3. VAD management - VAD interrogated personally. Parameters stable. - DL site ok  - LDH 166 - Hgb 11.8 - INR managed by [facility]. INR today 2.7. Results sent yo [facility] team  4 CKD 3a in setting of solitary kidney s/p dissection - Continue ARNI and Farxiga - creatinine 1.7 today  5. HTN:  - Blood pressure well controlled. Continue current regimen.  6. Palpitations - Zio showed frequent NSVT - continue amio 200 daily  7. Iron-deficiency anemia - has received Feraheme x 3MCV now up to 87. Hgb 11.8  Total time spent 35 minutes. Over half that time spent discussing above.  Glori Bickers, MD 10:21 AM

## 2021-12-27 NOTE — Progress Notes (Signed)
Advanced Heart Failure Clinic Note   Patient ID: Chad Bark., male   DOB: 1961/10/19, 61 y.o.   MRN: [id number]   Primary Cardiologist:  Dr. Glori Bickers   History of Present Illness:  Chad [name] is a 61 y.o. male with aevere HTN, CAD s/p MI with CABG 2006 and DES to native PDA in 3086, systolic HF EF 57-84% s/p BOsSCI ICD. 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  Underwent high risk HM-III VAD placement at [facility] 6/21.   Echo 3/22 EF 20% RV moderate HK.  RHC with exercise 3/22 on 5600  Rest RA 9 PCWP 12 CI 2.4 MAP 100 Exercise PCWP 27 CI 2.5 pVO2 7.4 slope 42  Here for VAD f/u. Remains listed at [facility]. Admitted to [facility] 12/22 with CP. Trops 105->149->158. CT TAA with stable findings from prior including aortic valve thrombus. INR goal increased to 2.5 - 3.5. Says he feels ok. Having a lot of palpitations. Taking torsemide 20mg  MWF + extra as needed. Adjusting hydralazine as needed for his BP. Denies orthopnea or PND. No fevers, chills or problems with driveline. No bleeding, melena or neuro symptoms. No VAD alarms. Taking all meds as prescribed.   ICD interrogated. Infrequent brief NSVT. No shocks. Personally reviewed     VAD Indication: Destination Therapy - HM III implanted at [facility] 06/04/20   VAD interrogation: Speed: 5800 Flow: 4.9 Power: 4.6w    PI: 3.7 Alarms: none Events: 20 - 30 PI events over last two days; 60+ on 12/25/20 Hct 34   Fixed speed: 5800 Low speed limit: 5600 - set by [facility]     I reviewed the LVAD parameters from today and compared the results to the patient's prior recorded data. LVAD interrogation was NEGATIVE for significant power changes, NEGATIVE for clinical alarms and POSITIVE for PI events/speed drops. No programming changes  were made and pump is functioning within specified parameters. Pt is performing daily controller and system monitor self tests along with completing weekly and monthly maintenance for LVAD equipment.   LVAD equipment check completed and is in good working order. Back-up equipment present.   Annual Equipment Maintenance on UBC/PM maintained by [facility].     Review of systems complete and found to be negative unless listed in HPI.   Past Medical History:  Diagnosis Date   AICD (automatic cardioverter/defibrillator) present    Anemia    Anginal pain ([place])    Anxiety    Aortic dissection, thoracoabdominal ([place])    7/10: Type I s/p repair   Arthritis    Asthma    CAD (coronary artery disease)    a. s/p CABG 2006;  b. DES to PDA 2011 (cath: Dx not seen, dRCA/PDA tx with DES; S-PDA occluded (culprit), S-Dx occluded, S-RI and OM ok, L-LAD ok   Carotid stenosis    dopplers 2011: 0-39% bilat.   Chest pain syndrome    CHF (congestive heart failure) ([place])    06/14/21 LVAD placemnt [facility]   Chronic bronchitis (HCC)    Chronic lower back pain    Chronic systolic heart failure ([place])    a. 12/13 ECHO: EF 35-40%, sept, apical & posterobasal HK, LV mod dil & sys fx mod reduced, mild AI, MV mild reg, TV mild reg   Complication of anesthesia    "  difficult to wake afterwards a couple of times" and patient states he has woken during surgery    CRI (chronic renal insufficiency)    "one kidney is gone; the other is hanging on" (11/07/2016)   Dyspnea    patient denies at 12/06/2018 appt    Family history of adverse reaction to anesthesia    "sister hard to wake up"   GERD (gastroesophageal reflux disease)    Gout    Heart murmur    HLD (hyperlipidemia)    HTN (hypertension)    severe   Myocardial infarction [facility])    "many" (11/07/2016)   PONV (postoperative nausea and vomiting)    Sleep apnea    does not use mask    Thyroid cancer (HCC)    Hertle Cell    Current Outpatient Medications   Medication Sig Dispense Refill   acetaminophen (TYLENOL) 325 MG tablet Take 2 tablets (650 mg total) by mouth every 4 (four) hours as needed for headache or mild pain.     Alirocumab (PRALUENT) 75 MG/ML SOAJ Inject 75 mg into the skin every 14 (fourteen) days. 2 mL 11   allopurinol (ZYLOPRIM) 100 MG tablet Take 1 tablet (100 mg total) by mouth daily. 30 tablet 5   alum & mag hydroxide-simeth (MAALOX/MYLANTA) 200-200-20 MG/5ML suspension Take 15 mLs by mouth every 6 (six) hours as needed for indigestion or heartburn. 355 mL 0   aspirin EC 81 MG tablet Take 81 mg by mouth every evening.     carvedilol (COREG) 6.25 MG tablet Take 1 tablet (6.25 mg total) by mouth 2 (two) times daily with a meal. If systolic is less than 90 you may hold Coreg until systolic is greater than 90, then resume taking 60 tablet 5   colchicine 0.6 MG tablet Take 1 tablet (0.6 mg total) by mouth daily. 30 tablet 11   FARXIGA 5 MG TABS tablet Take 5 mg by mouth every morning.     fluticasone (FLONASE) 50 MCG/ACT nasal spray Place 2 sprays into both nostrils daily as needed for allergies or rhinitis.      gabapentin (NEURONTIN) 300 MG capsule Take 1 capsule (300 mg total) by mouth at bedtime. (Patient taking differently: Take 300 mg by mouth 3 (three) times daily.) 30 capsule 3   hydrALAZINE (APRESOLINE) 25 MG tablet Take 100 mg by mouth 3 (three) times daily.     isosorbide mononitrate (IMDUR) 30 MG 24 hr tablet Take 60 mg by mouth daily.     KLOR-CON M20 20 MEQ tablet TAKE 1 TABLET BY MOUTH TWICE DAILY. TAKE  AN  EXTRA  2  TABLETS  WHEN  YOU  TAKE  METOLAZONE 135 tablet 0   methocarbamol (ROBAXIN) 750 MG tablet Take 750 mg by mouth daily.     nitroGLYCERIN (NITROSTAT) 0.4 MG SL tablet Place 1 tablet (0.4 mg total) under the tongue every 5 (five) minutes as needed for chest pain. May take if diastolic BP is > 90 30 tablet 3   Oxycodone HCl 20 MG TABS Take 20 mg by mouth every 6 (six) hours as needed (For pain).       sacubitril-valsartan (ENTRESTO) 97-103 MG Take 1 tablet by mouth 2 (two) times daily.     spironolactone (ALDACTONE) 25 MG tablet Take 25 mg by mouth daily.     torsemide (DEMADEX) 20 MG tablet Take 1 tablet (20 mg total) by mouth every Monday, Wednesday, and Friday. 60 tablet 4   warfarin (COUMADIN) 4 MG tablet Take  8 mg by mouth daily.     potassium chloride (KLOR-CON) 20 MEQ packet Take 20 mEq by mouth as directed. (Patient not taking: Reported on 12/27/2021)     No current facility-administered medications for this encounter.    Allergies  Allergen Reactions   Contrast Media [Iodinated Contrast Media] Anaphylaxis   Iohexol Anaphylaxis and Other (See Comments)    PT HAS ANAPHYLAXIS WITH CONTRAST MEDIA!   Lipitor [Atorvastatin Calcium] Anaphylaxis and Other (See Comments)    Large doses   Shellfish Allergy Anaphylaxis   Sulfa Antibiotics Shortness Of Breath and Swelling   Sulfonamide Derivatives Shortness Of Breath and Swelling   Almond Oil Itching    FACIAL/MOUTH ITCHING   Metrizamide Swelling    SWELLING REACTION UNSPECIFIED    Zocor [Simvastatin] Other (See Comments)    Muscle cramps   Food Itching    Shellfish, peaches, almonds, apples & kiwis   Latex Rash and Other (See Comments)    With long periods of exposure   Peach [Prunus Persica] Itching    Vital Signs:  Wt Readings from Last 3 Encounters:  09/12/21 82.8 kg (182 lb 9.6 oz)  09/02/21 90 kg (198 lb 6.6 oz)  07/29/21 81.6 kg (179 lb 14.3 oz)    Vital Signs:  Temp: 98.1 Doppler Pressure: 90 Automatc BP: 124/70 (93) HR: 80 SR  SPO2: 97% on RA   Weight: 189.2 lb w/ eqt Last weight: 202 lb w/ eqt (discharge weight from [place] admission) Home weights: does not weigh daily   Exam: General:  NAD.  HEENT: normal  Neck: supple. JVP not elevated.  Carotids 2+ bilat; no bruits. No lymphadenopathy or thryomegaly appreciated. Cor: LVAD hum.  Lungs: Clear. Abdomen: soft, nontender, non-distended. No  hepatosplenomegaly. No bruits or masses. Good bowel sounds. Driveline site clean. Anchor in place.  Extremities: no cyanosis, clubbing, rash. Warm no edema  Neuro: alert & oriented x 3. No focal deficits. Moves all 4 without problem     ASSESSMENT/PLAN:  1. Chronic systolic HF: Ischemic cardiomyopathy.  Echo (2/16) with EF 25%. S/p [employer] ICD. Echo 3/18. EF 20-25%. Echo 04/18/18: EF 20-25%, grade 2 DD. - s/p HM-III VAD at [facility] 6/21 - Echo and RHC at [facility] 3/22 (details above) - Stable NYHA II with VAD support - Volume status looks good, Continue current torsemide regimen - Continue Entresto 97/103 bid - Continue spiro 25 daily - Continue Farxiga 5 daily - Continue carvedilol 6.25 bid  - Continue hydral 100 tid and Imdur 30 daily (he adjusts dose as needed based on BP) - Currently on [facility] list at [facility]. No change  2. CAD s/p CABG 2006:  - has occasional atypical CP. recent admit at [facility] 12/22 for CP with minimally elevated troponin - doubt ACS - continue current regimen  3. VAD management - VAD interrogated personally. Parameters stable. - DL site ok  - LDH 196 - Hgb 12.1 - INR managed by [facility]. Goal now 2.5-2.5 with thrombosed AoV. INR 1.9 today. Results forwarded,   4 CKD 3a in setting of solitary kidney s/p dissection - Continue ARNI and Farxiga - creatinine 1.7 today  5. HTN:  - Blood pressure well controlled. Continue current regimen.  6. Palpitations - Previous Zio showed frequent NSVT - Rare NSVT on ICD interrogation today. continue amio 200 daily - Given ongoing palpitations will repeat zio given concern for frequent PVCs  7. Iron-deficiency anemia - resolved with Feraheme  Total time spent 35 minutes. Over half that time spent discussing  above.    Glori Bickers, MD 10:21 AM

## 2021-12-28 LAB — CUP PACEART REMOTE DEVICE CHECK
Battery Remaining Longevity: 48 mo
Battery Remaining Percentage: 53 %
Brady Statistic RV Percent Paced: 0 %
Date Time Interrogation Session: 20230103094700
HighPow Impedance: 50 Ohm
Implantable Lead Implant Date: 20171114
Implantable Lead Location: 753860
Implantable Lead Model: 181
Implantable Lead Serial Number: 333496
Implantable Pulse Generator Implant Date: 20131220
Lead Channel Impedance Value: 456 Ohm
Lead Channel Pacing Threshold Amplitude: 0.5 V
Lead Channel Pacing Threshold Pulse Width: 0.4 ms
Lead Channel Setting Pacing Amplitude: 2 V
Lead Channel Setting Pacing Pulse Width: 0.4 ms
Lead Channel Setting Sensing Sensitivity: 0.5 mV
Pulse Gen Serial Number: 124654

## 2022-01-06 NOTE — Progress Notes (Signed)
Remote ICD transmission.   

## 2022-01-09 ENCOUNTER — Other Ambulatory Visit (HOSPITAL_COMMUNITY): Payer: Self-pay | Admitting: Internal Medicine

## 2022-01-09 DIAGNOSIS — I5042 Chronic combined systolic (congestive) and diastolic (congestive) heart failure: Secondary | ICD-10-CM

## 2022-01-09 DIAGNOSIS — Z95811 Presence of heart assist device: Secondary | ICD-10-CM

## 2022-01-13 DIAGNOSIS — Z7901 Long term (current) use of anticoagulants: Secondary | ICD-10-CM | POA: Diagnosis not present

## 2022-01-13 DIAGNOSIS — Z95811 Presence of heart assist device: Secondary | ICD-10-CM | POA: Diagnosis not present

## 2022-01-17 DIAGNOSIS — Z20822 Contact with and (suspected) exposure to covid-19: Secondary | ICD-10-CM | POA: Diagnosis not present

## 2022-01-17 DIAGNOSIS — I255 Ischemic cardiomyopathy: Secondary | ICD-10-CM | POA: Diagnosis not present

## 2022-01-17 DIAGNOSIS — C73 Malignant neoplasm of thyroid gland: Secondary | ICD-10-CM | POA: Diagnosis not present

## 2022-01-17 DIAGNOSIS — I25118 Atherosclerotic heart disease of native coronary artery with other forms of angina pectoris: Secondary | ICD-10-CM | POA: Diagnosis not present

## 2022-01-17 DIAGNOSIS — I10 Essential (primary) hypertension: Secondary | ICD-10-CM | POA: Diagnosis not present

## 2022-01-17 DIAGNOSIS — N179 Acute kidney failure, unspecified: Secondary | ICD-10-CM | POA: Diagnosis not present

## 2022-01-17 DIAGNOSIS — R079 Chest pain, unspecified: Secondary | ICD-10-CM | POA: Diagnosis not present

## 2022-01-17 DIAGNOSIS — I110 Hypertensive heart disease with heart failure: Secondary | ICD-10-CM | POA: Diagnosis not present

## 2022-01-17 DIAGNOSIS — I5023 Acute on chronic systolic (congestive) heart failure: Secondary | ICD-10-CM | POA: Diagnosis not present

## 2022-01-19 NOTE — Addendum Note (Signed)
Encounter addended by: Micki Riley, RN on: 01/19/2022 7:42 PM  Actions taken: Imaging Exam ended

## 2022-01-19 NOTE — Addendum Note (Signed)
Encounter addended by: Micki Riley, RN on: 01/19/2022 7:41 PM  Actions taken: Imaging Exam ended
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
## 2022-01-20 IMAGING — CT CT L SPINE W/O CM
3 of 6 series · 11 of 35 positions shown, 12 images · non-contrast
Comparison: CT 03/22/2019

CLINICAL DATA: Trauma

EXAM:
CT LUMBAR SPINE WITHOUT CONTRAST
TECHNIQUE: Multidetector CT imaging of the lumbar spine was performed without
intravenous contrast administration. Multiplanar CT image
reconstructions were also generated.

[Series 11: sag bone · sagittal · 0.36mm/px · 6 of 106 slices shown]
[im 6/106  soft-tissue]
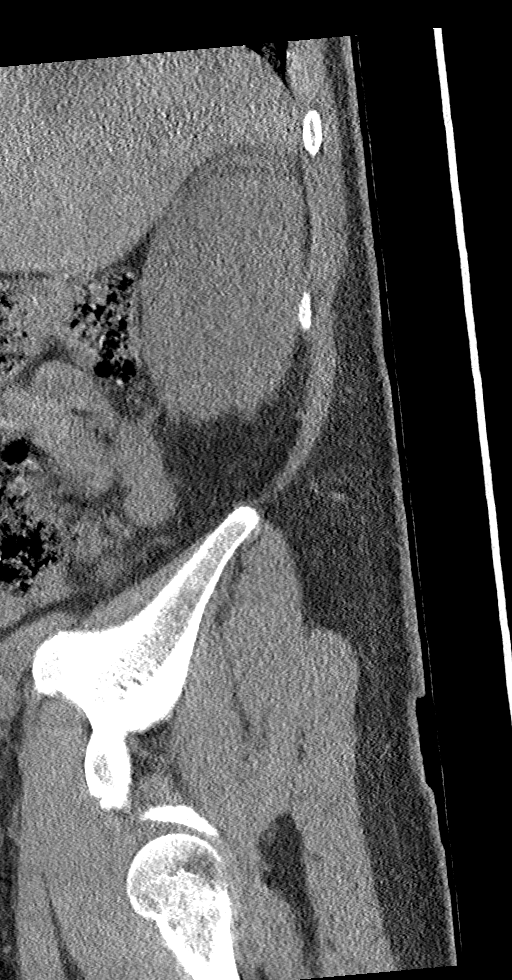
[im 18/106  bone]
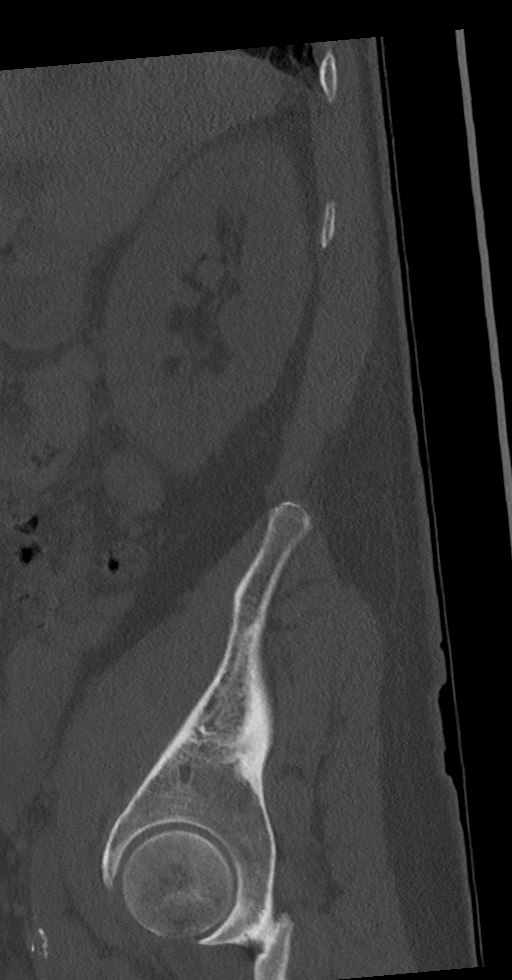
[im 36/106  bone]
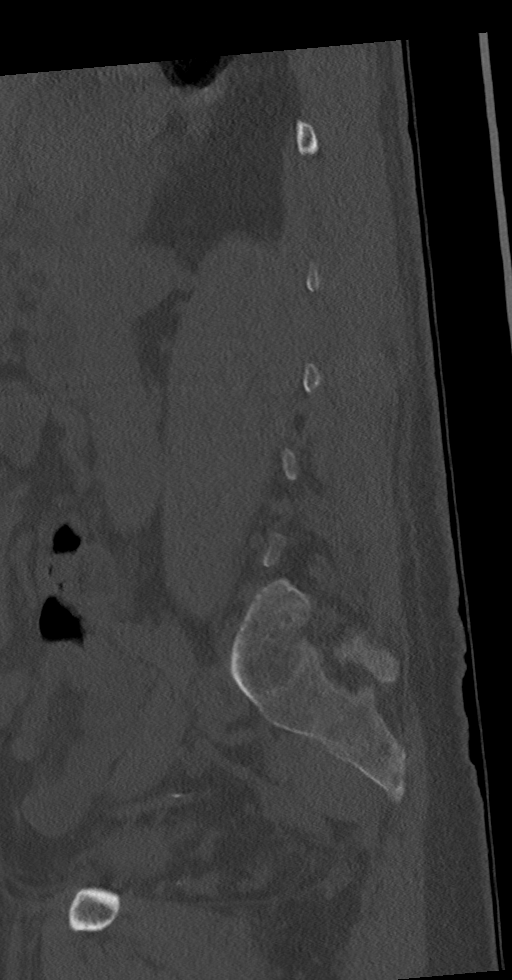
[im 53/106  bone]
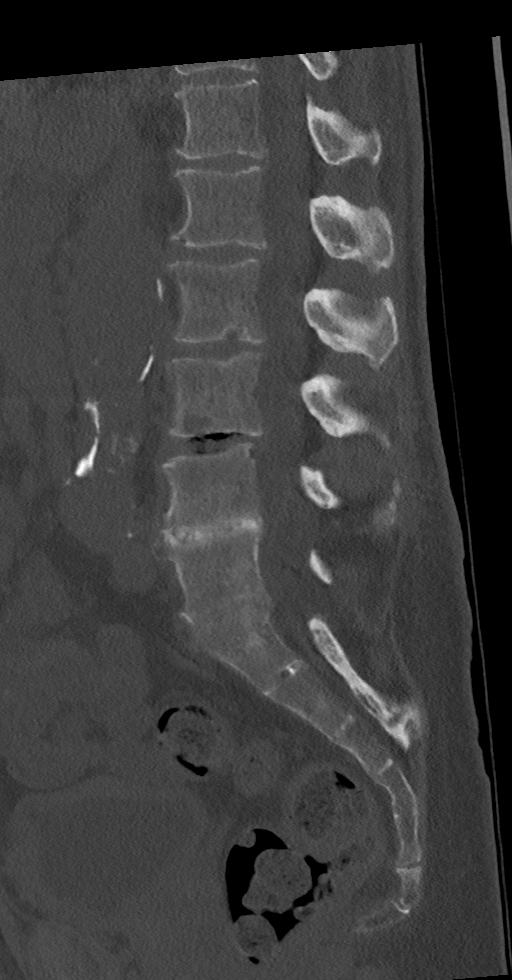
[im 71/106  bone]
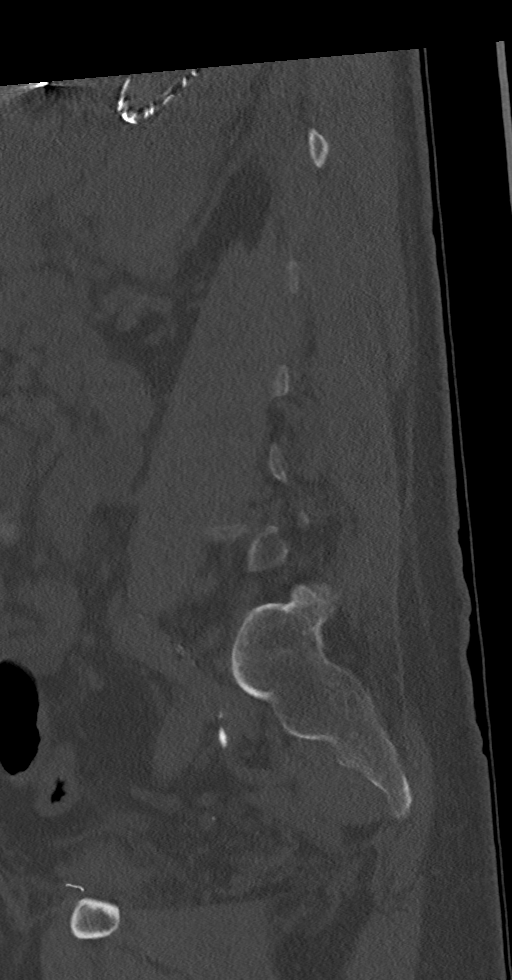
[im 88/106  bone]
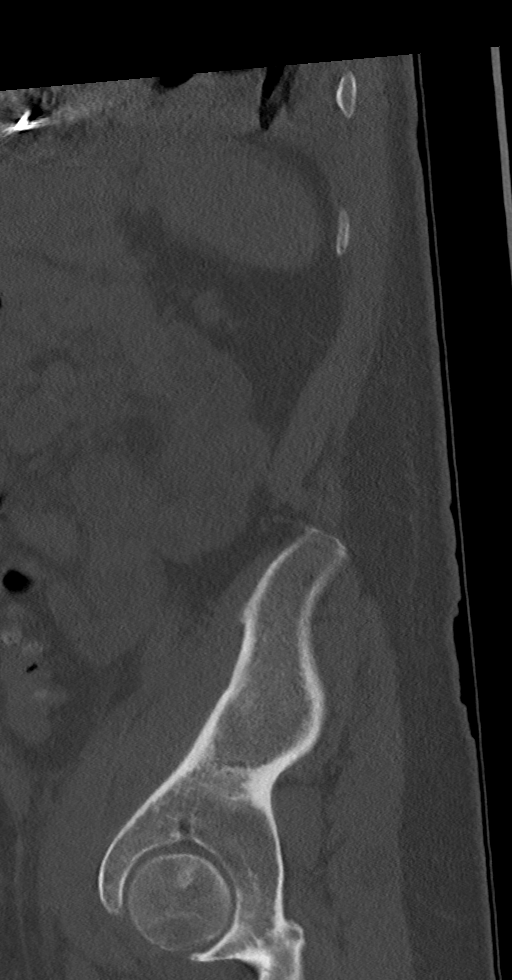

[Series 12: cor bone · coronal · 0.38mm/px · 1 of 79 slices shown]
[im 40/79  bone]
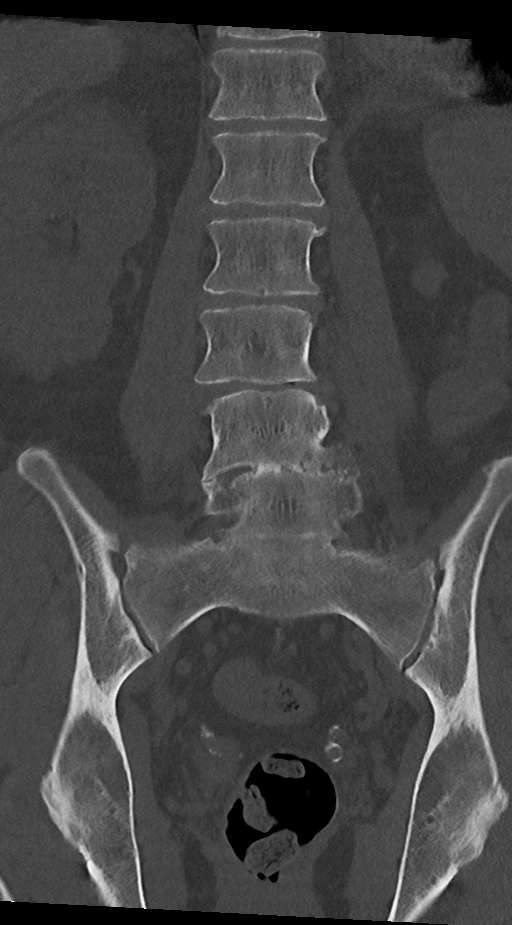

[Series 15: orthogonal · axial · 0.21mm/px · z∈[-681,-527]mm · 4 of 117 slices shown, 5 images]
[im 24/117  soft-tissue]
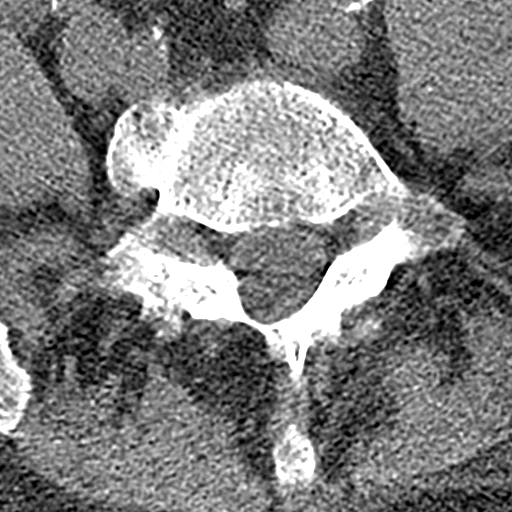
[im 24/117  bone]
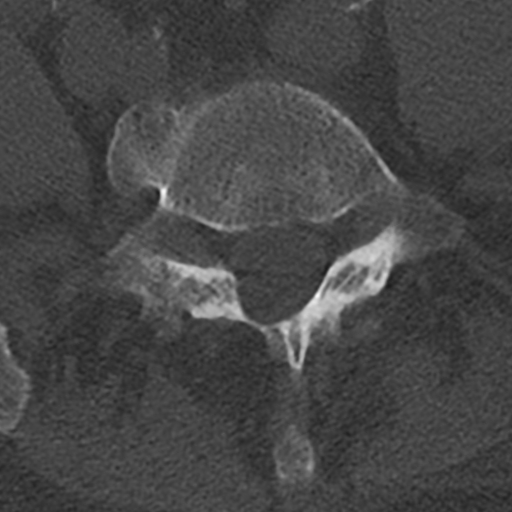
[im 47/117  bone]
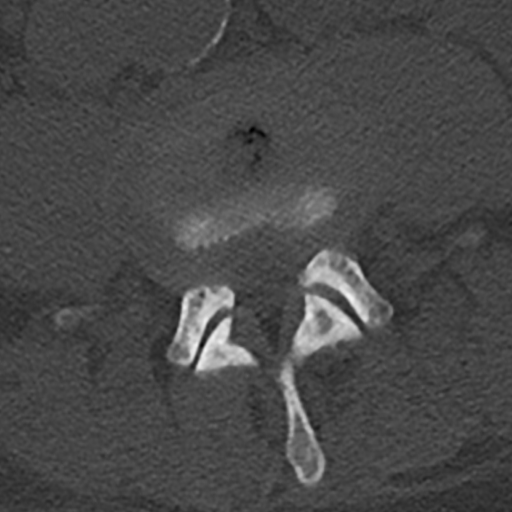
[im 70/117  bone]
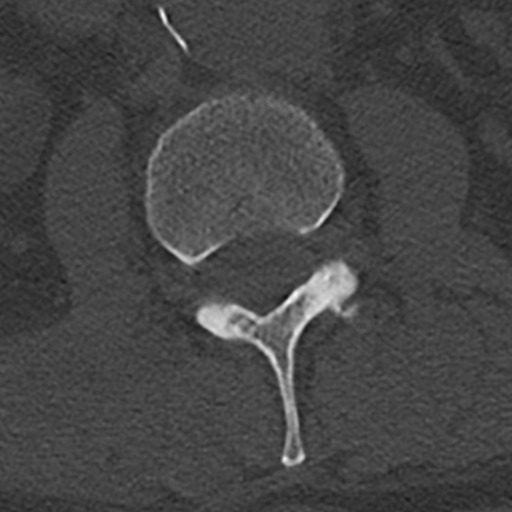
[im 93/117  bone]
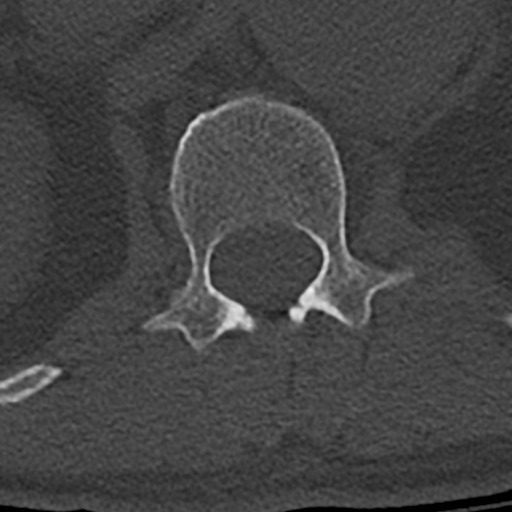

[11 of 35 positions shown; findings below may reference images not displayed]

FINDINGS: Segmentation: 5 lumbar type vertebrae.

Alignment: Trace retrolisthesis L3 on L4.

Vertebrae: No acute fracture or focal pathologic process.

Paraspinal and other soft tissues: Atrophic left kidney. No
paravertebral or paraspinal soft tissue abnormality. Thick-walled
urinary bladder

Disc levels:

At T12-L1, maintained disc space. No canal stenosis or foraminal
narrowing.

At L1-L2, mild disc space narrowing. No canal stenosis or foraminal
narrowing.

At L2-L3, mild disc space narrowing. No significant canal stenosis
or foraminal narrowing.

At L3-L4, vacuum disc with disc space narrowing. Facet degenerative
changes 1. Small left paracentral disc bulge. Mild foraminal
narrowing.

At L4-L5, disc space narrowing with calcification. No significant
canal stenosis. Bilateral foraminal narrowing secondary to calcified
disc osteophyte complex. Partial ankylosis across the disc space.

At L5-S1, loss of disc space. No significant canal stenosis. There
is bilateral foraminal narrowing. Partial ankylosis across the disc
space.
IMPRESSION: 1. Multilevel degenerative changes, most advanced at L4-L5 and
L5-S1.
2. No acute osseous abnormality.

## 2022-01-24 DIAGNOSIS — M79661 Pain in right lower leg: Secondary | ICD-10-CM | POA: Diagnosis not present

## 2022-01-24 DIAGNOSIS — M5442 Lumbago with sciatica, left side: Secondary | ICD-10-CM | POA: Diagnosis not present

## 2022-01-24 DIAGNOSIS — M5431 Sciatica, right side: Secondary | ICD-10-CM | POA: Diagnosis not present

## 2022-01-24 DIAGNOSIS — M25551 Pain in right hip: Secondary | ICD-10-CM | POA: Diagnosis not present

## 2022-01-24 DIAGNOSIS — Z95811 Presence of heart assist device: Secondary | ICD-10-CM | POA: Diagnosis not present

## 2022-01-24 DIAGNOSIS — M542 Cervicalgia: Secondary | ICD-10-CM | POA: Diagnosis not present

## 2022-01-24 DIAGNOSIS — M79662 Pain in left lower leg: Secondary | ICD-10-CM | POA: Diagnosis not present

## 2022-01-24 DIAGNOSIS — Z7901 Long term (current) use of anticoagulants: Secondary | ICD-10-CM | POA: Diagnosis not present

## 2022-01-24 DIAGNOSIS — M25552 Pain in left hip: Secondary | ICD-10-CM | POA: Diagnosis not present

## 2022-01-24 DIAGNOSIS — G894 Chronic pain syndrome: Secondary | ICD-10-CM | POA: Diagnosis not present

## 2022-01-25 DIAGNOSIS — Z419 Encounter for procedure for purposes other than remedying health state, unspecified: Secondary | ICD-10-CM | POA: Diagnosis not present

## 2022-01-30 ENCOUNTER — Other Ambulatory Visit (HOSPITAL_COMMUNITY): Payer: Self-pay | Admitting: Internal Medicine

## 2022-01-30 DIAGNOSIS — Z95811 Presence of heart assist device: Secondary | ICD-10-CM

## 2022-01-30 DIAGNOSIS — I5042 Chronic combined systolic (congestive) and diastolic (congestive) heart failure: Secondary | ICD-10-CM

## 2022-01-31 DIAGNOSIS — Z95811 Presence of heart assist device: Secondary | ICD-10-CM | POA: Diagnosis not present

## 2022-01-31 DIAGNOSIS — Z7901 Long term (current) use of anticoagulants: Secondary | ICD-10-CM | POA: Diagnosis not present

## 2022-02-03 ENCOUNTER — Telehealth (HOSPITAL_COMMUNITY): Payer: Self-pay | Admitting: *Deleted

## 2022-02-03 NOTE — Telephone Encounter (Signed)
Had voicemail from patient yesterday afternoon reporting he was very tired, and was having trouble staying awake. He feels that he needs iron infusion. Pt had recent iron studies completed at [facility]. Lauren PharmD reviewed recent iron studies- pt does not qualify for IV iron at this time based on those results.  I returned call to patient regarding the above. He reports he is feeling much better today. I discussed he is not a candidate for IV iron at this time based on lab results. He verbalized understanding.   Emerson Monte RN [place] Coordinator  Office: [phone]  24/7 Pager: [phone]

## 2022-02-12 ENCOUNTER — Emergency Department (HOSPITAL_COMMUNITY): Payer: Medicaid Other

## 2022-02-12 ENCOUNTER — Inpatient Hospital Stay (HOSPITAL_COMMUNITY): Payer: Medicaid Other

## 2022-02-12 ENCOUNTER — Inpatient Hospital Stay (HOSPITAL_COMMUNITY)
Admission: EM | Admit: 2022-02-12 | Discharge: 2022-02-12 | DRG: 082 | Disposition: A | Discharge status: Other Acute Inpatient Hospital | Payer: Medicaid Other | Attending: Cardiology | Admitting: Cardiology

## 2022-02-12 ENCOUNTER — Other Ambulatory Visit: Payer: Self-pay

## 2022-02-12 DIAGNOSIS — G9389 Other specified disorders of brain: Secondary | ICD-10-CM | POA: Diagnosis not present

## 2022-02-12 DIAGNOSIS — G4733 Obstructive sleep apnea (adult) (pediatric): Secondary | ICD-10-CM | POA: Diagnosis present

## 2022-02-12 DIAGNOSIS — S62112A Displaced fracture of triquetrum [cuneiform] bone, left wrist, initial encounter for closed fracture: Secondary | ICD-10-CM | POA: Diagnosis present

## 2022-02-12 DIAGNOSIS — Z7901 Long term (current) use of anticoagulants: Secondary | ICD-10-CM

## 2022-02-12 DIAGNOSIS — Y9389 Activity, other specified: Secondary | ICD-10-CM | POA: Diagnosis not present

## 2022-02-12 DIAGNOSIS — I5022 Chronic systolic (congestive) heart failure: Secondary | ICD-10-CM | POA: Diagnosis not present

## 2022-02-12 DIAGNOSIS — J90 Pleural effusion, not elsewhere classified: Secondary | ICD-10-CM | POA: Diagnosis not present

## 2022-02-12 DIAGNOSIS — I2510 Atherosclerotic heart disease of native coronary artery without angina pectoris: Secondary | ICD-10-CM | POA: Diagnosis present

## 2022-02-12 DIAGNOSIS — I10 Essential (primary) hypertension: Secondary | ICD-10-CM

## 2022-02-12 DIAGNOSIS — E890 Postprocedural hypothyroidism: Secondary | ICD-10-CM | POA: Diagnosis not present

## 2022-02-12 DIAGNOSIS — M1810 Unilateral primary osteoarthritis of first carpometacarpal joint, unspecified hand: Secondary | ICD-10-CM | POA: Diagnosis present

## 2022-02-12 DIAGNOSIS — S01511A Laceration without foreign body of lip, initial encounter: Secondary | ICD-10-CM | POA: Diagnosis present

## 2022-02-12 DIAGNOSIS — F419 Anxiety disorder, unspecified: Secondary | ICD-10-CM | POA: Diagnosis present

## 2022-02-12 DIAGNOSIS — I6200 Nontraumatic subdural hemorrhage, unspecified: Secondary | ICD-10-CM | POA: Diagnosis not present

## 2022-02-12 DIAGNOSIS — I25810 Atherosclerosis of coronary artery bypass graft(s) without angina pectoris: Secondary | ICD-10-CM | POA: Diagnosis present

## 2022-02-12 DIAGNOSIS — S62115A Nondisplaced fracture of triquetrum [cuneiform] bone, left wrist, initial encounter for closed fracture: Secondary | ICD-10-CM

## 2022-02-12 DIAGNOSIS — Z882 Allergy status to sulfonamides status: Secondary | ICD-10-CM

## 2022-02-12 DIAGNOSIS — I7103 Dissection of thoracoabdominal aorta: Secondary | ICD-10-CM | POA: Diagnosis present

## 2022-02-12 DIAGNOSIS — E785 Hyperlipidemia, unspecified: Secondary | ICD-10-CM | POA: Diagnosis present

## 2022-02-12 DIAGNOSIS — Y92524 Gas station as the place of occurrence of the external cause: Secondary | ICD-10-CM

## 2022-02-12 DIAGNOSIS — S065XAA Traumatic subdural hemorrhage with loss of consciousness status unknown, initial encounter: Secondary | ICD-10-CM | POA: Diagnosis not present

## 2022-02-12 DIAGNOSIS — N1831 Chronic kidney disease, stage 3a: Secondary | ICD-10-CM | POA: Diagnosis not present

## 2022-02-12 DIAGNOSIS — Z825 Family history of asthma and other chronic lower respiratory diseases: Secondary | ICD-10-CM

## 2022-02-12 DIAGNOSIS — Z20822 Contact with and (suspected) exposure to covid-19: Secondary | ICD-10-CM | POA: Diagnosis not present

## 2022-02-12 DIAGNOSIS — M25532 Pain in left wrist: Secondary | ICD-10-CM | POA: Diagnosis not present

## 2022-02-12 DIAGNOSIS — G8929 Other chronic pain: Secondary | ICD-10-CM | POA: Diagnosis present

## 2022-02-12 DIAGNOSIS — W19XXXA Unspecified fall, initial encounter: Secondary | ICD-10-CM | POA: Diagnosis present

## 2022-02-12 DIAGNOSIS — Z91018 Allergy to other foods: Secondary | ICD-10-CM

## 2022-02-12 DIAGNOSIS — Z85850 Personal history of malignant neoplasm of thyroid: Secondary | ICD-10-CM

## 2022-02-12 DIAGNOSIS — Z808 Family history of malignant neoplasm of other organs or systems: Secondary | ICD-10-CM

## 2022-02-12 DIAGNOSIS — J45909 Unspecified asthma, uncomplicated: Secondary | ICD-10-CM | POA: Diagnosis not present

## 2022-02-12 DIAGNOSIS — I130 Hypertensive heart and chronic kidney disease with heart failure and stage 1 through stage 4 chronic kidney disease, or unspecified chronic kidney disease: Secondary | ICD-10-CM | POA: Diagnosis present

## 2022-02-12 DIAGNOSIS — Z8249 Family history of ischemic heart disease and other diseases of the circulatory system: Secondary | ICD-10-CM

## 2022-02-12 DIAGNOSIS — Z043 Encounter for examination and observation following other accident: Secondary | ICD-10-CM | POA: Diagnosis not present

## 2022-02-12 DIAGNOSIS — M5030 Other cervical disc degeneration, unspecified cervical region: Secondary | ICD-10-CM | POA: Diagnosis not present

## 2022-02-12 DIAGNOSIS — I358 Other nonrheumatic aortic valve disorders: Secondary | ICD-10-CM | POA: Diagnosis not present

## 2022-02-12 DIAGNOSIS — I513 Intracardiac thrombosis, not elsewhere classified: Secondary | ICD-10-CM | POA: Diagnosis not present

## 2022-02-12 DIAGNOSIS — M7989 Other specified soft tissue disorders: Secondary | ICD-10-CM | POA: Diagnosis not present

## 2022-02-12 DIAGNOSIS — S62102A Fracture of unspecified carpal bone, left wrist, initial encounter for closed fracture: Secondary | ICD-10-CM

## 2022-02-12 DIAGNOSIS — K219 Gastro-esophageal reflux disease without esophagitis: Secondary | ICD-10-CM | POA: Diagnosis present

## 2022-02-12 DIAGNOSIS — M109 Gout, unspecified: Secondary | ICD-10-CM | POA: Diagnosis present

## 2022-02-12 DIAGNOSIS — Z953 Presence of xenogenic heart valve: Secondary | ICD-10-CM | POA: Diagnosis not present

## 2022-02-12 DIAGNOSIS — M545 Low back pain, unspecified: Secondary | ICD-10-CM | POA: Diagnosis present

## 2022-02-12 DIAGNOSIS — Z7682 Awaiting organ transplant status: Secondary | ICD-10-CM

## 2022-02-12 DIAGNOSIS — M4312 Spondylolisthesis, cervical region: Secondary | ICD-10-CM | POA: Diagnosis not present

## 2022-02-12 DIAGNOSIS — Z9852 Vasectomy status: Secondary | ICD-10-CM

## 2022-02-12 DIAGNOSIS — Z8679 Personal history of other diseases of the circulatory system: Secondary | ICD-10-CM

## 2022-02-12 DIAGNOSIS — Z95810 Presence of automatic (implantable) cardiac defibrillator: Secondary | ICD-10-CM

## 2022-02-12 DIAGNOSIS — Z95811 Presence of heart assist device: Secondary | ICD-10-CM | POA: Diagnosis not present

## 2022-02-12 DIAGNOSIS — W1839XA Other fall on same level, initial encounter: Secondary | ICD-10-CM | POA: Diagnosis not present

## 2022-02-12 DIAGNOSIS — J42 Unspecified chronic bronchitis: Secondary | ICD-10-CM | POA: Diagnosis present

## 2022-02-12 DIAGNOSIS — I252 Old myocardial infarction: Secondary | ICD-10-CM

## 2022-02-12 DIAGNOSIS — S065X0A Traumatic subdural hemorrhage without loss of consciousness, initial encounter: Secondary | ICD-10-CM | POA: Diagnosis not present

## 2022-02-12 DIAGNOSIS — Z7982 Long term (current) use of aspirin: Secondary | ICD-10-CM

## 2022-02-12 DIAGNOSIS — Z91041 Radiographic dye allergy status: Secondary | ICD-10-CM

## 2022-02-12 DIAGNOSIS — I5042 Chronic combined systolic (congestive) and diastolic (congestive) heart failure: Secondary | ICD-10-CM | POA: Diagnosis present

## 2022-02-12 DIAGNOSIS — I517 Cardiomegaly: Secondary | ICD-10-CM | POA: Diagnosis not present

## 2022-02-12 DIAGNOSIS — Z79899 Other long term (current) drug therapy: Secondary | ICD-10-CM

## 2022-02-12 DIAGNOSIS — Z888 Allergy status to other drugs, medicaments and biological substances status: Secondary | ICD-10-CM

## 2022-02-12 DIAGNOSIS — I6381 Other cerebral infarction due to occlusion or stenosis of small artery: Secondary | ICD-10-CM | POA: Diagnosis not present

## 2022-02-12 DIAGNOSIS — D696 Thrombocytopenia, unspecified: Secondary | ICD-10-CM | POA: Diagnosis not present

## 2022-02-12 DIAGNOSIS — I255 Ischemic cardiomyopathy: Secondary | ICD-10-CM | POA: Diagnosis not present

## 2022-02-12 DIAGNOSIS — Z91013 Allergy to seafood: Secondary | ICD-10-CM

## 2022-02-12 DIAGNOSIS — N183 Chronic kidney disease, stage 3 unspecified: Secondary | ICD-10-CM | POA: Diagnosis present

## 2022-02-12 LAB — BASIC METABOLIC PANEL
Anion gap: 10 (ref 5–15)
BUN: 17 mg/dL (ref 6–20)
CO2: 23 mmol/L (ref 22–32)
Calcium: 9 mg/dL (ref 8.9–10.3)
Chloride: 101 mmol/L (ref 98–111)
Creatinine, Ser: 1.47 mg/dL — ABNORMAL HIGH (ref 0.61–1.24)
GFR, Estimated: 55 mL/min — ABNORMAL LOW (ref >60)
Glucose, Bld: 103 mg/dL — ABNORMAL HIGH (ref 70–99)
Potassium: 4.4 mmol/L (ref 3.5–5.1)
Sodium: 134 mmol/L — ABNORMAL LOW (ref 135–145)

## 2022-02-12 LAB — CBC WITH DIFFERENTIAL/PLATELET
Abs Immature Granulocytes: 0 10*3/uL (ref 0.00–0.07)
Basophils Absolute: 0 10*3/uL (ref 0.0–0.1)
Basophils Relative: 0 %
Eosinophils Absolute: 0.2 10*3/uL (ref 0.0–0.5)
Eosinophils Relative: 5 %
HCT: 40 % (ref 39.0–52.0)
Hemoglobin: 12.5 g/dL — ABNORMAL LOW (ref 13.0–17.0)
Lymphocytes Relative: 27 %
Lymphs Abs: 1.2 10*3/uL (ref 0.7–4.0)
MCH: 26.7 pg (ref 26.0–34.0)
MCHC: 31.3 g/dL (ref 30.0–36.0)
MCV: 85.3 fL (ref 80.0–100.0)
Monocytes Absolute: 0.5 10*3/uL (ref 0.1–1.0)
Monocytes Relative: 12 %
Neutro Abs: 2.5 10*3/uL (ref 1.7–7.7)
Neutrophils Relative %: 56 %
Platelets: 133 10*3/uL — ABNORMAL LOW (ref 150–400)
RBC: 4.69 MIL/uL (ref 4.22–5.81)
RDW: 14.8 % (ref 11.5–15.5)
WBC: 4.5 10*3/uL (ref 4.0–10.5)
nRBC: 0 % (ref 0.0–0.2)
nRBC: 0 /100 WBC

## 2022-02-12 LAB — I-STAT CHEM 8, ED
BUN: 18 mg/dL (ref 6–20)
Calcium, Ion: 1.17 mmol/L (ref 1.15–1.40)
Chloride: 104 mmol/L (ref 98–111)
Creatinine, Ser: 1.6 mg/dL — ABNORMAL HIGH (ref 0.61–1.24)
Glucose, Bld: 100 mg/dL — ABNORMAL HIGH (ref 70–99)
HCT: 42 % (ref 39.0–52.0)
Hemoglobin: 14.3 g/dL (ref 13.0–17.0)
Potassium: 4.3 mmol/L (ref 3.5–5.1)
Sodium: 137 mmol/L (ref 135–145)
TCO2: 27 mmol/L (ref 22–32)

## 2022-02-12 LAB — PROTIME-INR
INR: 2.1 — ABNORMAL HIGH (ref 0.8–1.2)
INR: 1.3 — ABNORMAL HIGH (ref 0.8–1.2)
Prothrombin Time: 23.5 seconds — ABNORMAL HIGH (ref 11.4–15.2)
Prothrombin Time: 15.9 seconds — ABNORMAL HIGH (ref 11.4–15.2)

## 2022-02-12 LAB — RESP PANEL BY RT-PCR (FLU A&B, COVID) ARPGX2
Influenza A by PCR: NEGATIVE
Influenza B by PCR: NEGATIVE
SARS Coronavirus 2 by RT PCR: NEGATIVE

## 2022-02-12 LAB — MRSA NEXT GEN BY PCR, NASAL: MRSA by PCR Next Gen: NOT DETECTED

## 2022-02-12 LAB — HIV ANTIBODY (ROUTINE TESTING W REFLEX): HIV Screen 4th Generation wRfx: NONREACTIVE

## 2022-02-12 LAB — LACTATE DEHYDROGENASE: LDH: 183 U/L (ref 98–192)

## 2022-02-12 MED ORDER — PROTHROMBIN COMPLEX CONC HUMAN 500 UNITS IV KIT
1628.0000 [IU] | PACK | Status: AC
  Administered 2022-02-12: 1628 [IU] via INTRAVENOUS
  Filled 2022-02-12: qty 1110

## 2022-02-12 MED ORDER — ONDANSETRON HCL 4 MG/2ML IJ SOLN
4.0000 mg | Freq: Once | INTRAMUSCULAR | Status: AC
  Administered 2022-02-12: 4 mg via INTRAVENOUS
  Filled 2022-02-12: qty 2

## 2022-02-12 MED ORDER — GABAPENTIN 300 MG PO CAPS: 300.0000 mg | ORAL_CAPSULE | Freq: Three times a day (TID) | ORAL | Status: DC

## 2022-02-12 MED ORDER — ALLOPURINOL 100 MG PO TABS: 100.0000 mg | ORAL_TABLET | Freq: Every day | ORAL | Status: DC

## 2022-02-12 MED ORDER — MORPHINE SULFATE (PF) 2 MG/ML IV SOLN
2.0000 mg | Freq: Once | INTRAVENOUS | Status: AC
  Administered 2022-02-12: 2 mg via INTRAVENOUS
  Filled 2022-02-12: qty 1

## 2022-02-12 MED ORDER — COLCHICINE 0.6 MG PO TABS: 0.6000 mg | ORAL_TABLET | Freq: Every day | ORAL | Status: DC

## 2022-02-12 MED ORDER — ISOSORBIDE MONONITRATE ER 60 MG PO TB24: 60.0000 mg | ORAL_TABLET | Freq: Every day | ORAL | Status: DC

## 2022-02-12 MED ORDER — ONDANSETRON HCL 4 MG/2ML IJ SOLN: 4.0000 mg | Freq: Four times a day (QID) | INTRAMUSCULAR | Status: DC | PRN

## 2022-02-12 MED ORDER — FENTANYL CITRATE PF 50 MCG/ML IJ SOSY
50.0000 ug | PREFILLED_SYRINGE | Freq: Once | INTRAMUSCULAR | Status: AC
  Administered 2022-02-12: 50 ug via INTRAVENOUS
  Filled 2022-02-12: qty 1

## 2022-02-12 MED ORDER — ACETAMINOPHEN 325 MG PO TABS: 650.0000 mg | ORAL_TABLET | ORAL | Status: DC | PRN

## 2022-02-12 MED ORDER — SACUBITRIL-VALSARTAN 97-103 MG PO TABS
1.0000 | ORAL_TABLET | Freq: Two times a day (BID) | ORAL | Status: DC
  Filled 2022-02-12: qty 1

## 2022-02-12 MED ORDER — ASPIRIN EC 81 MG PO TBEC: 81.0000 mg | DELAYED_RELEASE_TABLET | Freq: Every evening | ORAL | Status: DC

## 2022-02-12 MED ORDER — HYDRALAZINE HCL 20 MG/ML IJ SOLN: 10.0000 mg | INTRAMUSCULAR | Status: DC | PRN

## 2022-02-12 MED ORDER — CARVEDILOL 6.25 MG PO TABS: 6.2500 mg | ORAL_TABLET | Freq: Two times a day (BID) | ORAL | Status: DC

## 2022-02-12 MED ORDER — DAPAGLIFLOZIN PROPANEDIOL 5 MG PO TABS: 5.0000 mg | ORAL_TABLET | Freq: Every morning | ORAL | Status: DC

## 2022-02-12 MED ORDER — VITAMIN K1 10 MG/ML IJ SOLN
10.0000 mg | Freq: Once | INTRAVENOUS | Status: AC
  Administered 2022-02-12: 10 mg via INTRAVENOUS
  Filled 2022-02-12: qty 1

## 2022-02-12 MED ORDER — SPIRONOLACTONE 25 MG PO TABS: 25.0000 mg | ORAL_TABLET | Freq: Every day | ORAL | Status: DC

## 2022-02-12 MED ORDER — HYDRALAZINE HCL 50 MG PO TABS: 100.0000 mg | ORAL_TABLET | Freq: Three times a day (TID) | ORAL | Status: DC

## 2022-02-12 MED ORDER — MORPHINE SULFATE (PF) 2 MG/ML IV SOLN
1.0000 mg | INTRAVENOUS | Status: DC | PRN
  Administered 2022-02-12: 2 mg via INTRAVENOUS
  Filled 2022-02-12: qty 1

## 2022-02-12 MED ORDER — AMLODIPINE BESYLATE 5 MG PO TABS: 5.0000 mg | ORAL_TABLET | Freq: Every day | ORAL | Status: DC

## 2022-02-12 MED ORDER — HYDRALAZINE HCL 20 MG/ML IJ SOLN
10.0000 mg | INTRAMUSCULAR | Status: DC
Start: 2022-02-12 — End: 2022-02-12

## 2022-02-12 MED ORDER — HYDRALAZINE HCL 20 MG/ML IJ SOLN
10.0000 mg | Freq: Once | INTRAMUSCULAR | Status: AC
  Administered 2022-02-12: 10 mg via INTRAVENOUS
  Filled 2022-02-12: qty 1

## 2022-02-12 NOTE — Progress Notes (Signed)
0601-Received page from trauma nurse that pt arrived to ED due to a fall and has possible wrist fracture. Informed nurse that pt is a [facility] pt and they will need to reach out to the [place] team  0700-Received page from trauma nurse stating that the pt has a subdural hematoma and will need to be admitted for observation per neurosurgery. Trauma nurse informed that [facility] team has to be notified prior to our team admitting the pt as the pt is a [facility] pt. Trauma nurse given the number for the [facility] V[facility] team  1035-Received page from [facility] regarding pt being admitted from ED. I ask 2H nurse to ensure that ED reached out to [facility] team about the pt admission.   Pt is currently on emergency cart. Pt has his back up bag with him.  Tanda Rockers RN, BSN VAD Coordinator 24/7 Pager [phone]

## 2022-02-12 NOTE — ED Notes (Signed)
Dr. Zada Finders at bedside

## 2022-02-12 NOTE — ED Notes (Signed)
Transition of Care (TOC) - CAGE-AID Screening   Patient Details  Name: Chad [name]. MRN: [id number] Date of Birth: 1961-02-22  Transition of Care [facility]) CM/SW Contact:    Clovis Cao, RN Phone Number: [phone] 02/12/2022, 4:54 PM   Clinical Narrative: Pt here after sustaining a SDH after falling and hitting his head.  He is an LVAD patient and is on warfarin.  Pt states he does not need resources for drugs or alcohol.    CAGE-AID Screening:    Have You Ever Felt You Ought to Cut Down on Your Drinking or Drug Use?: No Have People Annoyed You By Critizing Your Drinking Or Drug Use?: No Have You Felt Bad Or Guilty About Your Drinking Or Drug Use?: No Have You Ever Had a Drink or Used Drugs First Thing In The Morning to Steady Your Nerves or to Get Rid of a Hangover?: No CAGE-AID Score: 0  Substance Abuse Education Offered: No

## 2022-02-12 NOTE — ED Notes (Signed)
Pt has photosensitivity

## 2022-02-12 NOTE — Consult Note (Signed)
NAME:  Chad Vale., MRN:  [id number], DOB:  [birth year], LOS: 0 ADMISSION DATE:  02/12/2022, CONSULTATION DATE:  02/12/22 REFERRING MD:  Aundra Dubin, CHIEF COMPLAINT:  fall, headache   History of Present Illness:  9yM with history of LVAD bridge therapy HM3 on warfarin - listed for transplant at [facility], ICM s/p [employer] ICD, type I aortic dissection in 2010, CAD, carotid stenosis, GERD, OSA, CKD with solitary kidney who is admitted after a fall at a gas station. Tripped over something and hit his head, left hand, wrist. Now has HA, facial pain, photophobia, bleeding lip laceration.  NSGY and heart failure consulted. Given 10 of IV vitamin K with plan for follow up [place] shortly.  Pertinent  Medical History  LVAD bridge therapy HM3 on warfarin - listed for [facility] at [facility] CAD carotid stenosis GERD OSA  Significant Hospital Events: Including procedures, antibiotic start and stop dates in addition to other pertinent events   2/19 admitted, NSGY/CHF consulted, 10 IV vit K, f/u CTH   Interim History / Subjective:    Objective   Blood pressure (!) 125/103, pulse 80, temperature 97.7 F (36.5 C), temperature source Temporal, resp. rate 16, height 5\' 11"  (1.803 m), weight 85.8 kg, SpO2 100 %.       No intake or output data in the 24 hours ending 02/12/22 0945 Filed Weights   02/12/22 0555  Weight: 85.8 kg    Examination: General appearance: 61 y.o., male, NAD, conversant  Eyes: PERRL, tracking appropriately HENT: dry MM Lungs: CTAB, no crackles, no wheeze, with normal respiratory effort CV: vad hum present, drive line with dressing cdi Abdomen: Soft, non-tender; non-distended, BS present  Extremities: No peripheral edema, lukewarm Skin: Normal turgor and texture; no rash Neuro: Alert and oriented to person and place/situation, grossly no focal deficit   CXR clear  CTH with right frontal 1.3cm SDH with 2-72mm midline shift  Resolved Hospital Problem list      Assessment & Plan:  # ICM s/p LVAD as bridge therapy - MAP goal per NSGY as below - GDMT per CHF - hold warfarin  # Traumatic SDH # Possible TBI # Hemorrhagic contusion  - s/p 10 IV vit K - f/u CTH 4h - MAP goal per NSGY - Neuroprotective measures: HOB > 30 degrees, normoglycemia, normothermia, eucapnia, correct electrolytes  # Possible left wrist fracture - consider volar splint, ice  # CKD - daily Bmet   Best Practice (right click and "Reselect all SmartList Selections" daily)   Per CHF  Labs   CBC: Recent Labs  Lab 02/12/22 0559 02/12/22 0601  WBC  --  4.5  NEUTROABS  --  2.5  HGB 14.3 12.5*  HCT 42.0 40.0  MCV  --  85.3  PLT  --  133*    Basic Metabolic Panel: Recent Labs  Lab 02/12/22 0559 02/12/22 0601  NA 137 134*  K 4.3 4.4  CL 104 101  CO2  --  23  GLUCOSE 100* 103*  BUN 18 17  CREATININE 1.60* 1.47*  CALCIUM  --  9.0   GFR: Estimated Creatinine Clearance: 57.6 mL/min (A) (by C-G formula based on SCr of 1.47 mg/dL (H)). Recent Labs  Lab 02/12/22 0601  WBC 4.5    Liver Function Tests: No results for input(s): AST, ALT, ALKPHOS, BILITOT, PROT, ALBUMIN in the last 168 hours. No results for input(s): LIPASE, AMYLASE in the last 168 hours. No results for input(s): AMMONIA in the last 168 hours.  ABG  Component Value Date/Time   PHART 7.424 06/23/2009 0503   PCO2ART 47.1 (H) 06/23/2009 0503   PO2ART 64.0 (L) 06/23/2009 0503   HCO3 29.7 (H) 01/19/2020 0804   TCO2 27 02/12/2022 0559   ACIDBASEDEF 4.0 (H) 06/22/2009 0052   O2SAT 59.1 05/03/2020 0853     Coagulation Profile: Recent Labs  Lab 02/12/22 0601  INR 2.1*    Cardiac Enzymes: No results for input(s): CKTOTAL, CKMB, CKMBINDEX, TROPONINI in the last 168 hours.  HbA1C: Hemoglobin A1C  Date/Time Value Ref Range Status  09/29/2019 02:39 PM 5.8 (A) 4.0 - 5.6 % Final   Hgb A1c MFr Bld  Date/Time Value Ref Range Status  12/15/2017 03:18 PM 6.2 (H) 4.8 - 5.6 % Final     Comment:    (NOTE) Pre diabetes:          5.7%-6.4% Diabetes:              >6.4% Glycemic control for   <7.0% adults with diabetes   10/11/2014 03:40 AM 6.0 (H) <5.7 % Final    Comment:    (NOTE)                                                                       According to the ADA Clinical Practice Recommendations for 2011, when HbA1c is used as a screening test:  >=6.5%   Diagnostic of Diabetes Mellitus           (if abnormal result is confirmed) 5.7-6.4%   Increased risk of developing Diabetes Mellitus References:Diagnosis and Classification of Diabetes Mellitus,Diabetes ZCHY,8502,77(AJOIN 1):S62-S69 and Standards of Medical Care in         Diabetes - 2011,Diabetes Care,2011,34 (Suppl 1):S11-S61.    CBG: No results for input(s): GLUCAP in the last 168 hours.  Review of Systems:   12 point review of systems is negative except as in HPI  Past Medical History:  He,  has a past medical history of AICD (automatic cardioverter/defibrillator) present, Anemia, Anginal pain ([place]), Anxiety, Aortic dissection, thoracoabdominal (HCC), Arthritis, Asthma, CAD (coronary artery disease), Carotid stenosis, Chest pain syndrome, CHF (congestive heart failure) (HCC), Chronic bronchitis ([place]), Chronic lower back pain, Chronic systolic heart failure ([place]), Complication of anesthesia, CRI (chronic renal insufficiency), Dyspnea, Family history of adverse reaction to anesthesia, GERD (gastroesophageal reflux disease), Gout, Heart murmur, HLD (hyperlipidemia), HTN (hypertension), Myocardial infarction ([place]), PONV (postoperative nausea and vomiting), Sleep apnea, and Thyroid cancer ([place]).   Surgical History:   Past Surgical History:  Procedure Laterality Date   BACK SURGERY     CARDIAC CATHETERIZATION     "several" (11/07/2016)   CARDIAC DEFIBRILLATOR PLACEMENT  11/2005   [employer]; Archie Endo 05/09/2011   CHOLECYSTECTOMY N/A 12/09/2018   Procedure: LAPAROSCOPIC CHOLECYSTECTOMY;  Surgeon:  Ralene Ok, MD;  Location: [facility];  Service: General;  Laterality: N/A;   CORONARY ANGIOPLASTY WITH STENT PLACEMENT     "I've had 1-2 stents" (11/07/2016)   CORONARY ARTERY BYPASS GRAFT  06/21/2009   "CABG X2"   CORONARY ARTERY BYPASS GRAFT  06/29/2005   "CABG X7"   ICD LEAD REMOVAL  11/07/2016   ICD LEAD REMOVAL N/A 11/07/2016   Procedure: ICD LEAD REMOVAL, INSERTION OF NEW ICD LEAD;  Surgeon: Evans Lance, MD;  Location: [facility] OR;  Service: Cardiovascular;  Laterality: N/A;  Dr. Prescott Gum to backup case   IMPLANTABLE CARDIOVERTER DEFIBRILLATOR (ICD) GENERATOR CHANGE N/A 12/13/2012   Procedure: ICD GENERATOR CHANGE;  Surgeon: Evans Lance, MD;  Location: [facility] C[facility] LAB;  Service: Cardiovascular;  Laterality: N/A;   INGUINAL HERNIA REPAIR Bilateral 06/25/2018   Procedure: LAPAROSCOPIC  BILATERAL INGUINAL HERNIA REPAIRS;  Surgeon: Ralene Ok, MD;  Location: [place];  Service: General;  Laterality: Bilateral;   INSERTION OF MESH Bilateral 06/25/2018   Procedure: INSERTION OF MESH;  Surgeon: Ralene Ok, MD;  Location: [place];  Service: General;  Laterality: Bilateral;   [place] SURGERY  01/2001    most recent within 5-10 years   RIGHT HEART CATH N/A 04/05/2018   Procedure: RIGHT HEART CATH;  Surgeon: Jolaine Artist, MD;  Location: [place] CV LAB;  Service: Cardiovascular;  Laterality: N/A;   RIGHT HEART CATH N/A 01/19/2020   Procedure: RIGHT HEART CATH;  Surgeon: Jolaine Artist, MD;  Location: [place] CV LAB;  Service: Cardiovascular;  Laterality: N/A;   SHOULDER ARTHROSCOPY WITH ROTATOR CUFF REPAIR Bilateral    Status post emergency repair of a type A ascending aortic dissection with a hemiarch reconstruction of the ascending aorta  using a 28-mm Hemashield graft with redo sternotomy and revision of previous bypass grafts in June 2010.     TESTICLE SURGERY     THYROIDECTOMY, PARTIAL  06/20/2011   TONSILLECTOMY     UMBILICAL HERNIA REPAIR N/A 06/25/2018    Procedure: UMBILICAL HERNIA REPAIR;  Surgeon: Ralene Ok, MD;  Location: [place];  Service: General;  Laterality: N/A;   VASECTOMY       Social History:   reports that he has never smoked. He has never used smokeless tobacco. He reports that he does not drink alcohol and does not use drugs.   Family History:  His family history includes COPD in his father; Coronary artery disease in an other family member; Early death in his father; Heart disease in his father; Hypertension in his father and mother.   Allergies Allergies  Allergen Reactions   Contrast Media [Iodinated Contrast Media] Anaphylaxis   Iohexol Anaphylaxis and Other (See Comments)    PT HAS ANAPHYLAXIS WITH CONTRAST MEDIA!   Lipitor [Atorvastatin Calcium] Anaphylaxis and Other (See Comments)    Large doses   Shellfish Allergy Anaphylaxis   Sulfa Antibiotics Shortness Of Breath and Swelling   Sulfonamide Derivatives Shortness Of Breath and Swelling   Almond Oil Itching    FACIAL/MOUTH ITCHING   Metrizamide Swelling    SWELLING REACTION UNSPECIFIED    Zocor [Simvastatin] Other (See Comments)    Muscle cramps   Food Itching    Shellfish, peaches, almonds, apples & kiwis   Latex Rash and Other (See Comments)    With long periods of exposure   Peach [Prunus Persica] Itching     Home Medications  Prior to Admission medications   Medication Sig Start Date End Date Taking? Authorizing Provider  acetaminophen (TYLENOL) 325 MG tablet Take 2 tablets (650 mg total) by mouth every 4 (four) hours as needed for headache or mild pain. 05/03/20  Yes Clegg, Amy D, NP  allopurinol (ZYLOPRIM) 100 MG tablet Take 1 tablet (100 mg total) by mouth daily. 10/18/21  Yes Bensimhon, Shaune Pascal, MD  alum & mag hydroxide-simeth (MAALOX/MYLANTA) 200-200-20 MG/5ML suspension Take 15 mLs by mouth every 6 (six) hours as needed for indigestion or heartburn. 05/03/20  Yes Clegg, Amy  D, NP  amLODipine (NORVASC) 5 MG tablet Take 5 mg by mouth daily.  01/15/22  Yes [provider]  aspirin EC 81 MG tablet Take 81 mg by mouth every evening.   Yes [provider]  carvedilol (COREG) 6.25 MG tablet Take 1 tablet (6.25 mg total) by mouth 2 (two) times daily with a meal. If systolic is less than 90 you may hold Coreg until systolic is greater than 90, then resume taking 10/21/21  Yes Bensimhon, Shaune Pascal, MD  colchicine 0.6 MG tablet Take 1 tablet (0.6 mg total) by mouth daily. 11/03/21  Yes Bensimhon, Shaune Pascal, MD  FARXIGA 5 MG TABS tablet Take 5 mg by mouth every morning. 02/15/21  Yes [provider]  ferrous sulfate 325 (65 FE) MG tablet Take 325 mg by mouth daily with breakfast.   Yes [provider]  fluticasone (FLONASE) 50 MCG/ACT nasal spray Place 2 sprays into both nostrils daily as needed for allergies or rhinitis.    Yes [provider]  gabapentin (NEURONTIN) 300 MG capsule Take 1 capsule (300 mg total) by mouth at bedtime. Patient taking differently: Take 300 mg by mouth 3 (three) times daily. 08/25/20  Yes Bensimhon, Shaune Pascal, MD  hydrALAZINE (APRESOLINE) 100 MG tablet Take 100 mg by mouth 3 (three) times daily. 01/19/21  Yes [provider]  isosorbide mononitrate (IMDUR) 30 MG 24 hr tablet Take 60 mg by mouth in the morning and at bedtime. 03/06/21  Yes [provider]  KLOR-CON M20 20 MEQ tablet TAKE 1 TABLET BY MOUTH TWICE DAILY. TAKE  AN  EXTRA  2  TABLETS  WHEN  YOU  TAKE  METOLAZONE Patient taking differently: Take 20 mEq by mouth daily as needed (with toresemide as needed). 11/28/21  Yes Bensimhon, Shaune Pascal, MD  methocarbamol (ROBAXIN) 750 MG tablet Take 750 mg by mouth daily. 02/24/21  Yes [provider]  naloxone (NARCAN) nasal spray 4 mg/0.1 mL Place 1 spray into the nose as needed (accidental overdose).   Yes [provider]  nitroGLYCERIN (NITROSTAT) 0.4 MG SL tablet DISSOLVE ONE TABLET UNDER THE TONGUE EVERY 5 MINUTES AS NEEDED FOR CHEST PAIN. MAY TAKE  IF DIASTOLIC BP IS GREATER THAN 90 Patient taking differently: Place 0.4 mg under the tongue every 5 (five) minutes as needed for chest pain. 01/30/22  Yes Bensimhon, Shaune Pascal, MD  Oxycodone HCl 20 MG TABS Take 20 mg by mouth every 6 (six) hours. 12/22/19  Yes [provider]  sacubitril-valsartan (ENTRESTO) 97-103 MG Take 1 tablet by mouth 2 (two) times daily.   Yes [provider]  spironolactone (ALDACTONE) 25 MG tablet Take 25 mg by mouth daily.   Yes [provider]  torsemide (DEMADEX) 20 MG tablet Take 1 tablet (20 mg total) by mouth every Monday, Wednesday, and Friday. Patient taking differently: Take 20 mg by mouth daily as needed (fluid). 04/04/21  Yes Bensimhon, Shaune Pascal, MD  warfarin (COUMADIN) 2 MG tablet Take 8 mg by mouth at bedtime. 01/09/22  Yes [provider]  Alirocumab (PRALUENT) 75 MG/ML SOAJ Inject 75 mg into the skin every 14 (fourteen) days. Patient not taking: Reported on 02/12/2022 09/03/20   Bensimhon, Shaune Pascal, MD     Critical care time: n/a

## 2022-02-12 NOTE — Progress Notes (Signed)
Orthopedic Tech Progress Note Patient Details:  Chad [name]. [birth year] [id number]  A well-padded volar splint applied to L wrist. Pt reported no areas or irritation or tightness from the splint.  Ortho Devices Type of Ortho Device: Volar splint Ortho Device/Splint Location: LUE Ortho Device/Splint Interventions: Application, Adjustment   Post Interventions Patient Tolerated: Well Instructions Provided: Care of device  Navi Ewton Jeri Modena 02/12/2022, 1:48 PM

## 2022-02-12 NOTE — ED Triage Notes (Signed)
Pt states he fell while at the gas station. Pt c/o lip pain and left wrist pain. Pt is on warafin.

## 2022-02-12 NOTE — ED Notes (Signed)
TDAP 02/2014

## 2022-02-12 NOTE — ED Notes (Signed)
Paged Cardiology and Neurosurgery to Dr. Roslynn Amble

## 2022-02-12 NOTE — Progress Notes (Signed)
Patient ID: Chad Bark., male   DOB: [birth year], 61 y.o.   MRN: [id number]  CT head with worsening midline shift.  Clinically, patient is stable.  Discussed with Dr. Zada Finders.  Patient has had vitamin K.  Will give Kcentra at this point for full reversal.  Will not go to OR yet given clinical stability, will repeat CT head in 4 hours.   Loralie Champagne 02/12/2022 12:27 PM

## 2022-02-12 NOTE — H&P (Signed)
Advanced Heart Failure VAD History and Physical Note   PCP-Cardiologist: Glori Bickers, MD   Reason for Admission: Subdural hemorrhage  HPI:    Chad [name] is a 61 y.o. male with severe HTN, CAD s/p MI with CABG 2006 and DES to native PDA in 9798, systolic HF EF 92-11% s/p [employer] ICD. 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   Underwent high risk HM-III VAD placement at [facility] 6/21.    Echo 3/22 EF 20% RV moderate HK.  RHC with exercise 3/22 on 5600  Rest RA 9 PCWP 12 CI 2.4 MAP 100 Exercise PCWP 27 CI 2.5 pVO2 7.4 slope 42   Remains listed for transplant at [facility]. Admitted to [facility] 12/22 with CP. Trops 105->149->158. CT TAA with stable findings from prior including aortic valve thrombus. INR goal increased to 2.5 - 3.5.   He was stable prior to admission, walking without dyspnea.  Early this morning, was filling up car to gas station and tripped at the gas pump, falling and striking head and left wrist on concrete.  He developed headache so came to ER.  Currently reports severe HA with photophobia and nausea.  He is alert/oriented, answers all questions, no gross neurologic abnormalities on exam.   Wrist film shows triquetral fracture on left.  Head CT shows large acute right SDH overlying frontal and temporal lobes, mass effect with 2-3 mm midline shift.   Seen by neurosurgery, for now plan on conservative management.  Will give vitamin K 10 mg IV but no Kcentra.  INR 2.1.  Will repeat CT in 4 hrs to determine if more aggressive management is needed.  MAP 110s.    LVAD INTERROGATION:  HeartMate II LVAD:  Flow 4.5 liters/min, speed 5800, power 4.6, PI 4.4.  8 PI events last day     Review of Systems: All systems reviewed and negative except as per HPI.     Home Medications Prior to Admission medications   Medication Sig Start Date End Date Taking? Authorizing Provider  acetaminophen (TYLENOL) 325 MG tablet Take 2 tablets (650 mg total) by mouth every 4 (four) hours as needed for headache or mild pain. 05/03/20  Yes Clegg, Amy D, NP  allopurinol (ZYLOPRIM) 100 MG tablet Take 1 tablet (100 mg total) by mouth daily. 10/18/21  Yes Bensimhon, Shaune Pascal, MD  alum & mag hydroxide-simeth (MAALOX/MYLANTA) 200-200-20 MG/5ML suspension Take 15 mLs by mouth every 6 (six) hours as needed for indigestion or heartburn. 05/03/20  Yes Clegg, Amy D, NP  amLODipine (NORVASC) 5 MG tablet Take 5 mg by mouth daily. 01/15/22  Yes [provider]  aspirin EC 81 MG tablet Take 81 mg by mouth every evening.   Yes [provider]  carvedilol (COREG) 6.25 MG tablet Take 1 tablet (6.25 mg total) by mouth 2 (two) times daily with a meal. If systolic is less than 90 you may hold Coreg until systolic is greater than 90, then resume taking 10/21/21  Yes Bensimhon, Shaune Pascal, MD  colchicine 0.6 MG tablet Take 1 tablet (0.6 mg total) by mouth daily. 11/03/21  Yes Bensimhon, Shaune Pascal, MD  FARXIGA 5 MG TABS tablet Take 5 mg by mouth every morning. 02/15/21  Yes [provider]  ferrous sulfate 325 (65 FE) MG tablet Take  325 mg by mouth daily with breakfast.   Yes [provider]  fluticasone (FLONASE) 50 MCG/ACT nasal spray Place 2 sprays into both nostrils daily as needed for allergies or rhinitis.    Yes [provider]  gabapentin (NEURONTIN) 300 MG capsule Take 1 capsule (300 mg total) by mouth at bedtime. Patient taking differently: Take 300 mg by mouth 3 (three) times daily. 08/25/20  Yes Bensimhon, Shaune Pascal, MD  hydrALAZINE (APRESOLINE) 100 MG tablet Take 100 mg by mouth 3 (three) times daily. 01/19/21  Yes [provider]  isosorbide mononitrate (IMDUR) 30 MG 24 hr tablet Take 60 mg by mouth in the morning and at bedtime. 03/06/21   Yes [provider]  KLOR-CON M20 20 MEQ tablet TAKE 1 TABLET BY MOUTH TWICE DAILY. TAKE  AN  EXTRA  2  TABLETS  WHEN  YOU  TAKE  METOLAZONE Patient taking differently: Take 20 mEq by mouth daily as needed (with toresemide as needed). 11/28/21  Yes Bensimhon, Shaune Pascal, MD  methocarbamol (ROBAXIN) 750 MG tablet Take 750 mg by mouth daily. 02/24/21  Yes [provider]  naloxone (NARCAN) nasal spray 4 mg/0.1 mL Place 1 spray into the nose as needed (accidental overdose).   Yes [provider]  nitroGLYCERIN (NITROSTAT) 0.4 MG SL tablet DISSOLVE ONE TABLET UNDER THE TONGUE EVERY 5 MINUTES AS NEEDED FOR CHEST PAIN. MAY TAKE IF DIASTOLIC BP IS GREATER THAN 90 Patient taking differently: Place 0.4 mg under the tongue every 5 (five) minutes as needed for chest pain. 01/30/22  Yes Bensimhon, Shaune Pascal, MD  Oxycodone HCl 20 MG TABS Take 20 mg by mouth every 6 (six) hours. 12/22/19  Yes [provider]  sacubitril-valsartan (ENTRESTO) 97-103 MG Take 1 tablet by mouth 2 (two) times daily.   Yes [provider]  spironolactone (ALDACTONE) 25 MG tablet Take 25 mg by mouth daily.   Yes [provider]  torsemide (DEMADEX) 20 MG tablet Take 1 tablet (20 mg total) by mouth every Monday, Wednesday, and Friday. Patient taking differently: Take 20 mg by mouth daily as needed (fluid). 04/04/21  Yes Bensimhon, Shaune Pascal, MD  warfarin (COUMADIN) 2 MG tablet Take 8 mg by mouth at bedtime. 01/09/22  Yes [provider]  Alirocumab (PRALUENT) 75 MG/ML SOAJ Inject 75 mg into the skin every 14 (fourteen) days. Patient not taking: Reported on 02/12/2022 09/03/20   Bensimhon, Shaune Pascal, MD    Past Medical History: Past Medical History:  Diagnosis Date   AICD (automatic cardioverter/defibrillator) present    Anemia    Anginal pain ([place])    Anxiety    Aortic dissection, thoracoabdominal ([place])    7/10: Type I s/p repair   Arthritis    Asthma    CAD (coronary artery  disease)    a. s/p CABG 2006;  b. DES to PDA 2011 (cath: Dx not seen, dRCA/PDA tx with DES; S-PDA occluded (culprit), S-Dx occluded, S-RI and OM ok, L-LAD ok   Carotid stenosis    dopplers 2011: 0-39% bilat.   Chest pain syndrome    CHF (congestive heart failure) ([place])    06/14/21 LVAD placemnt [facility]   Chronic bronchitis ([facility])    Chronic lower back pain    Chronic systolic heart failure ([place])    a. 12/13 ECHO: EF 35-40%, sept, apical & posterobasal HK, LV mod dil & sys fx mod reduced, mild AI, MV mild reg, TV mild reg   Complication of anesthesia    "difficult  to wake afterwards a couple of times" and patient states he has woken during surgery    CRI (chronic renal insufficiency)    "one kidney is gone; the other is hanging on" (11/07/2016)   Dyspnea    patient denies at 12/06/2018 appt    Family history of adverse reaction to anesthesia    "sister hard to wake up"   GERD (gastroesophageal reflux disease)    Gout    Heart murmur    HLD (hyperlipidemia)    HTN (hypertension)    severe   Myocardial infarction ([place])    "many" (11/07/2016)   PONV (postoperative nausea and vomiting)    Sleep apnea    does not use mask    Thyroid cancer ([place])    Hertle Cell    Past Surgical History: Past Surgical History:  Procedure Laterality Date   BACK SURGERY     CARDIAC CATHETERIZATION     "several" (11/07/2016)   CARDIAC DEFIBRILLATOR PLACEMENT  11/2005   [employer]; Archie Endo 05/09/2011   CHOLECYSTECTOMY N/A 12/09/2018   Procedure: LAPAROSCOPIC CHOLECYSTECTOMY;  Surgeon: Ralene Ok, MD;  Location: [facility];  Service: General;  Laterality: N/A;   CORONARY ANGIOPLASTY WITH STENT PLACEMENT     "I've had 1-2 stents" (11/07/2016)   CORONARY ARTERY BYPASS GRAFT  06/21/2009   "CABG X2"   CORONARY ARTERY BYPASS GRAFT  06/29/2005   "CABG X7"   ICD LEAD REMOVAL  11/07/2016   ICD LEAD REMOVAL N/A 11/07/2016   Procedure: ICD LEAD REMOVAL, INSERTION OF NEW ICD LEAD;  Surgeon: Evans Lance, MD;  Location: [place];  Service: Cardiovascular;  Laterality: N/A;  Dr. Prescott Gum to backup case   IMPLANTABLE CARDIOVERTER DEFIBRILLATOR (ICD) GENERATOR CHANGE N/A 12/13/2012   Procedure: ICD GENERATOR CHANGE;  Surgeon: Evans Lance, MD;  Location: [facility] CATH LAB;  Service: Cardiovascular;  Laterality: N/A;   INGUINAL HERNIA REPAIR Bilateral 06/25/2018   Procedure: LAPAROSCOPIC  BILATERAL INGUINAL HERNIA REPAIRS;  Surgeon: Ralene Ok, MD;  Location: [place];  Service: General;  Laterality: Bilateral;   INSERTION OF MESH Bilateral 06/25/2018   Procedure: INSERTION OF MESH;  Surgeon: Ralene Ok, MD;  Location: [place];  Service: General;  Laterality: Bilateral;   [place] SURGERY  01/2001    most recent within 5-10 years   RIGHT HEART CATH N/A 04/05/2018   Procedure: RIGHT HEART CATH;  Surgeon: Jolaine Artist, MD;  Location: [place] [facility];  Service: Cardiovascular;  Laterality: N/A;   RIGHT HEART CATH N/A 01/19/2020   Procedure: RIGHT HEART CATH;  Surgeon: Jolaine Artist, MD;  Location: [place] [facility];  Service: Cardiovascular;  Laterality: N/A;   SHOULDER ARTHROSCOPY WITH ROTATOR CUFF REPAIR Bilateral    Status post emergency repair of a type A ascending aortic dissection with a hemiarch reconstruction of the ascending aorta  using a 28-mm Hemashield graft with redo sternotomy and revision of previous bypass grafts in June 2010.     TESTICLE SURGERY     THYROIDECTOMY, PARTIAL  06/20/2011   TONSILLECTOMY     UMBILICAL HERNIA REPAIR N/A 06/25/2018   Procedure: UMBILICAL HERNIA REPAIR;  Surgeon: Ralene Ok, MD;  Location: [place];  Service: General;  Laterality: N/A;   VASECTOMY      Family History: Family History  Problem Relation Age of Onset   Hypertension Father    Heart disease Father    Early death Father    COPD Father    Hypertension Mother  Coronary artery disease Other     Social History: Social History   Socioeconomic History   Marital  status: Divorced    Spouse name: Not on file   Number of children: Not on file   Years of education: Not on file   Highest education level: Not on file  Occupational History   Occupation: disabled  Tobacco Use   Smoking status: Never   Smokeless tobacco: Never  Vaping Use   Vaping Use: Never used  Substance and Sexual Activity   Alcohol use: No   Drug use: No   Sexual activity: Yes  Other Topics Concern   Not on file  Social History Narrative   Engaged and lives with fiancee and 4 kids.    No working currently    Scientist, physiological Strain: Not on file  Food Insecurity: Not on file  Transportation Needs: Not on file  Physical Activity: Not on file  Stress: Not on file  Social Connections: Not on file    Allergies:  Allergies  Allergen Reactions   Contrast Media [Iodinated Contrast Media] Anaphylaxis   Iohexol Anaphylaxis and Other (See Comments)    PT HAS ANAPHYLAXIS WITH CONTRAST MEDIA!   Lipitor [Atorvastatin Calcium] Anaphylaxis and Other (See Comments)    Large doses   Shellfish Allergy Anaphylaxis   Sulfa Antibiotics Shortness Of Breath and Swelling   Sulfonamide Derivatives Shortness Of Breath and Swelling   Almond Oil Itching    FACIAL/MOUTH ITCHING   Metrizamide Swelling    SWELLING REACTION UNSPECIFIED    Zocor [Simvastatin] Other (See Comments)    Muscle cramps   Food Itching    Shellfish, peaches, almonds, apples & kiwis   Latex Rash and Other (See Comments)    With long periods of exposure   Peach [Prunus Persica] Itching    Objective:    Vital Signs:   Temp:  [97.7 F (36.5 C)] 97.7 F (36.5 C) (02/19 0619) Pulse Rate:  [78-84] 84 (02/19 0930) Resp:  [16-18] 18 (02/19 0930) BP: (121-127)/(103-107) 121/107 (02/19 0930) SpO2:  [95 %-100 %] 100 % (02/19 0930) Weight:  [85.8 kg] 85.8 kg (02/19 0555)   Filed Weights   02/12/22 0555  Weight: 85.8 kg    Mean arterial Pressure 110s  Physical Exam     General:  Uncomfortable HEENT: Normal Neck: supple. JVP not elevated. Carotids 2+ bilat; no bruits. No lymphadenopathy or thyromegaly appreciated. Cor: Mechanical heart sounds with LVAD hum present. Lungs: Clear Abdomen: soft, nontender, nondistended. No hepatosplenomegaly. No bruits or masses. Good bowel sounds. Driveline: C/D/I; securement device intact and driveline incorporated Extremities: no cyanosis, clubbing, rash, edema Neuro: alert & orientedx3, cranial nerves grossly intact. moves all 4 extremities grossly without difficulty.    Telemetry   NSR (personally reviewed)    Labs    Basic Metabolic Panel: Recent Labs  Lab 02/12/22 0559 02/12/22 0601  NA 137 134*  K 4.3 4.4  CL 104 101  CO2  --  23  GLUCOSE 100* 103*  BUN 18 17  CREATININE 1.60* 1.47*  CALCIUM  --  9.0    Liver Function Tests: No results for input(s): AST, ALT, ALKPHOS, BILITOT, PROT, ALBUMIN in the last 168 hours. No results for input(s): LIPASE, AMYLASE in the last 168 hours. No results for input(s): AMMONIA in the last 168 hours.  CBC: Recent Labs  Lab 02/12/22 0559 02/12/22 0601  WBC  --  4.5  NEUTROABS  --  2.5  HGB 14.3 12.5*  HCT 42.0 40.0  MCV  --  85.3  PLT  --  133*    Cardiac Enzymes: No results for input(s): CKTOTAL, CKMB, CKMBINDEX, TROPONINI in the last 168 hours.  BNP: BNP (last 3 results) No results for input(s): BNP in the last 8760 hours.  ProBNP (last 3 results) No results for input(s): PROBNP in the last 8760 hours.   CBG: No results for input(s): GLUCAP in the last 168 hours.  Coagulation Studies: Recent Labs    02/12/22 0601  LABPROT 23.5*  INR 2.1*    Imaging    DG Wrist Complete Left  Result Date: 02/12/2022 CLINICAL DATA:  Left wrist pain.  Fall. EXAM: LEFT WRIST - COMPLETE 3+ VIEW COMPARISON:  None. FINDINGS: There is an os ossific density along the dorsum of the wrist which may reflect underlying triquetral fracture. Mild overlying soft  tissue swelling identified. No additional signs of fracture or dislocation. Degenerative changes noted at the basilar joint. IMPRESSION: 1. Ossific density along the dorsum of the wrist may reflect underlying triquetral fracture. Correlate for any focal tenderness over this area. 2. Basilar joint osteoarthritis. Electronically Signed   By: Kerby Moors M.D.   On: 02/12/2022 06:22   CT Head Wo Contrast  Result Date: 02/12/2022 CLINICAL DATA:  Status post fall EXAM: CT HEAD WITHOUT CONTRAST CT MAXILLOFACIAL WITHOUT CONTRAST CT CERVICAL SPINE WITHOUT CONTRAST TECHNIQUE: Multidetector CT imaging of the head, cervical spine, and maxillofacial structures were performed using the standard protocol without intravenous contrast. Multiplanar CT image reconstructions of the cervical spine and maxillofacial structures were also generated. RADIATION DOSE REDUCTION: This exam was performed according to the departmental dose-optimization program which includes automated exposure control, adjustment of the mA and/or kV according to patient size and/or use of iterative reconstruction technique. COMPARISON:  None. 09/02/2021 FINDINGS: CT HEAD FINDINGS Brain: There is a large acute right subdural hematoma which overlies the inferior right frontal lobe and anterior right temporal lobe. This measures 1.3 cm in thickness measured along the undersurface of the inferior right frontal lobe, image 20/5. There is associated mass effect upon the underlying cerebral cortex with effacement of the sulci. 2-3 mm of right to left midline shift noted. Para falcine subdural hematoma is also identified. This measures approximately 6 mm in thickness, image 26/3. Mild hemorrhagic contusion noted over the right inferior frontal gyrus, is suspected. The ventricles are normal in volume. No intraventricular hemorrhage identified. There is mild diffuse low-attenuation within the subcortical and periventricular white matter compatible with chronic  microvascular disease. 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 Vascular: No hyperdense vessel or unexpected calcification. Skull: Normal. Negative for fracture or focal lesion. Other: None CT MAXILLOFACIAL FINDINGS Osseous: No fracture or mandibular dislocation. No destructive process. Orbits: Negative. No traumatic or inflammatory finding. Sinuses: Clear. Soft tissues: Negative. CT CERVICAL SPINE FINDINGS Alignment: There is no signs of acute posttraumatic malalignment of the cervical spine. Anterolisthesis of C7 on T1 measures 3 mm and is likely related to chronic spondylosis. Skull base and vertebrae: No acute fracture. No primary bone lesion or focal pathologic process. Soft tissues and spinal canal: No prevertebral fluid or swelling. No visible canal hematoma. Disc levels: Marked multilevel disc space narrowing with endplate spurring is identified from C2-3 through C7-T1. Upper chest: There is pleural thickening along the medial aspect of the left apex which is only partially visualized but appears similar to 12/10/2021. On the scout radiograph signs of stent graft repair of  thoracic aortic aneurysm noted. Other: None IMPRESSION: 1. Large acute right subdural hematoma overlying the inferior right frontal lobe and anterior right temporal lobe with associated mass effect upon the underlying cerebral cortex and 2-3 mm of right to left midline shift. 2. Para falcine subdural hematoma is also identified. 3. Mild hemorrhagic contusion over the right inferior frontal gyrus. 4. No evidence for facial bone fracture or dislocation. 5. No evidence for acute cervical spine fracture or subluxation. 6. 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 Critical Value/emergent results were called by telephone at the time of interpretation on 02/12/2022 at 6:53 am to provider [facility] , who verbally acknowledged these results. Electronically Signed   By: Kerby Moors M.D.   On: 02/12/2022 06:53   CT Cervical Spine Wo Contrast  Result Date: 02/12/2022 CLINICAL DATA:  Status post fall EXAM: CT HEAD WITHOUT CONTRAST CT MAXILLOFACIAL WITHOUT CONTRAST CT CERVICAL SPINE WITHOUT CONTRAST TECHNIQUE: Multidetector CT imaging of the head, cervical spine, and maxillofacial structures were performed using the standard protocol without intravenous contrast. Multiplanar CT image reconstructions of the cervical spine and maxillofacial structures were also generated. RADIATION DOSE REDUCTION: This exam was performed according to the departmental dose-optimization program which includes automated exposure control, adjustment of the mA and/or kV according to patient size and/or use of iterative reconstruction technique. COMPARISON:  None. 09/02/2021 FINDINGS: CT HEAD FINDINGS Brain: There is a large acute right subdural hematoma which overlies the inferior right frontal lobe and anterior right temporal lobe. This measures 1.3 cm in thickness measured along the undersurface of the inferior right frontal lobe, image 20/5. There is associated mass effect upon the underlying cerebral cortex with effacement of the sulci. 2-3 mm of right to left midline shift noted. Para falcine subdural hematoma is also identified. This measures approximately 6 mm in thickness, image 26/3. Mild hemorrhagic contusion noted over the right inferior frontal gyrus, is suspected. The ventricles are normal in volume. No intraventricular hemorrhage identified. There is mild diffuse low-attenuation within the subcortical and periventricular white matter compatible with chronic microvascular disease. 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 Vascular: No hyperdense vessel or unexpected calcification. Skull: Normal. Negative for fracture or focal lesion. Other: None CT MAXILLOFACIAL FINDINGS Osseous: No fracture or mandibular dislocation. No destructive process. Orbits: Negative. No  traumatic or inflammatory finding. Sinuses: Clear. Soft tissues: Negative. CT CERVICAL SPINE FINDINGS Alignment: There is no signs of acute posttraumatic malalignment of the cervical spine. Anterolisthesis of C7 on T1 measures 3 mm and is likely related to chronic spondylosis. Skull base and vertebrae: No acute fracture. No primary bone lesion or focal pathologic process. Soft tissues and spinal canal: No prevertebral fluid or swelling. No visible canal hematoma. Disc levels: Marked multilevel disc space narrowing with endplate spurring is identified from C2-3 through C7-T1. Upper chest: There is pleural thickening along the medial aspect of the left apex which is only partially visualized but appears similar to 12/10/2021. On the scout radiograph signs of stent graft repair of thoracic aortic aneurysm noted. Other: None IMPRESSION: 1. Large acute right subdural hematoma overlying the inferior right frontal lobe and anterior right temporal lobe with associated mass effect upon the underlying cerebral cortex and 2-3 mm of right to left midline shift. 2. Para falcine subdural hematoma is also identified. 3. Mild hemorrhagic contusion over the right inferior frontal gyrus. 4. No evidence for facial bone fracture or dislocation. 5. No evidence for acute cervical spine fracture or subluxation.  6. 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 Critical Value/emergent results were called by telephone at the time of interpretation on 02/12/2022 at 6:53 am to provider [facility] , who verbally acknowledged these results. Electronically Signed   By: Kerby Moors M.D.   On: 02/12/2022 06:53   DG Chest Portable 1 View  Result Date: 02/12/2022 CLINICAL DATA:  61 year old male status post fall on blood thinners. History of endograft treated thoracic aortic aneurysm. LVAD. EXAM: PORTABLE CHEST 1 VIEW COMPARISON:  Noncontrast chest CT 12/10/2021 and earlier. FINDINGS: Portable AP semi upright view  at 0635 hours. Long segment thoracic aortic endograft from the arch through the level of the diaphragm appears stable since last year with unchanged abnormal aortic contour/aneurysm. Superimposed LVAD is stable. Prior CABG and aortic valve replacement. Stable cardiomegaly and mediastinal contours. No pneumothorax or pulmonary edema. Stable blunting of the left costophrenic angle. No right pleural effusion. No new pulmonary opacity. No acute osseous abnormality identified. Negative visible bowel gas. IMPRESSION: 1. No acute cardiopulmonary abnormality or acute traumatic injury identified. 2. Stable chronic cardiomegaly, thoracic aortic aneurysm with endograft, LVAD, small left pleural effusion. Electronically Signed   By: Genevie Ann M.D.   On: 02/12/2022 07:13   DG Shoulder Left Portable  Result Date: 02/12/2022 CLINICAL DATA:  61 year old male status post fall on blood thinners. EXAM: LEFT SHOULDER COMPARISON:  Portable chest today.  Left humerus series 09/02/2021. FINDINGS: Single AP view of the left shoulder. Partially visible LVAD generator device, thoracic aortic aneurysm and endograft. Stable alignment about the left shoulder on this single view. Proximal left humerus appears intact. No acute osseous abnormality identified. IMPRESSION: No acute osseous abnormality identified. Electronically Signed   By: Genevie Ann M.D.   On: 02/12/2022 07:15   DG Hand Complete Left  Result Date: 02/12/2022 CLINICAL DATA:  61 year old male status post fall on blood thinners. EXAM: LEFT HAND - COMPLETE 3+ VIEW COMPARISON:  Left wrist series today. FINDINGS: Left index finger pulse oximeter artifact. Bone mineralization is within normal limits. Small chronic ossific fragment at the dorsal carpal bones as seen on the wrist series today. Distal radius and ulna appear intact. First CMC joint space loss, osteoarthritis. Metacarpals and phalanges appear intact. IMPRESSION: 1. No acute fracture or dislocation identified about the left  hand. First CMC osteoarthritis. 2. Possible triquetrum fracture as on the wrist series. Electronically Signed   By: Genevie Ann M.D.   On: 02/12/2022 07:18   DG Hand Complete Right  Result Date: 02/12/2022 CLINICAL DATA:  61 year old male status post fall on blood thinners. EXAM: RIGHT HAND - COMPLETE 3+ VIEW COMPARISON:  None. FINDINGS: Bone mineralization is within normal limits. There is no evidence of fracture or dislocation. There is no evidence of arthropathy or other focal bone abnormality. No discrete soft tissue injury. IMPRESSION: Negative. Electronically Signed   By: Genevie Ann M.D.   On: 02/12/2022 07:17   CT Maxillofacial Wo Contrast  Result Date: 02/12/2022 CLINICAL DATA:  Status post fall EXAM: CT HEAD WITHOUT CONTRAST CT MAXILLOFACIAL WITHOUT CONTRAST CT CERVICAL SPINE WITHOUT CONTRAST TECHNIQUE: Multidetector CT imaging of the head, cervical spine, and maxillofacial structures were performed using the standard protocol without intravenous contrast. Multiplanar CT image reconstructions of the cervical spine and maxillofacial structures were also generated. RADIATION DOSE REDUCTION: This exam was performed according to the departmental dose-optimization program which includes automated exposure control, adjustment of the mA and/or kV according to patient size and/or use of iterative reconstruction technique. COMPARISON:  None. 09/02/2021 FINDINGS: CT HEAD FINDINGS Brain: There is a large acute right subdural hematoma which overlies the inferior right frontal lobe and anterior right temporal lobe. This measures 1.3 cm in thickness measured along the undersurface of the inferior right frontal lobe, image 20/5. There is associated mass effect upon the underlying cerebral cortex with effacement of the sulci. 2-3 mm of right to left midline shift noted. Para falcine subdural hematoma is also identified. This measures approximately 6 mm in thickness, image 26/3. Mild hemorrhagic contusion noted over the  right inferior frontal gyrus, is suspected. The ventricles are normal in volume. No intraventricular hemorrhage identified. There is mild diffuse low-attenuation within the subcortical and periventricular white matter compatible with chronic microvascular disease. 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 Vascular: No hyperdense vessel or unexpected calcification. Skull: Normal. Negative for fracture or focal lesion. Other: None CT MAXILLOFACIAL FINDINGS Osseous: No fracture or mandibular dislocation. No destructive process. Orbits: Negative. No traumatic or inflammatory finding. Sinuses: Clear. Soft tissues: Negative. CT CERVICAL SPINE FINDINGS Alignment: There is no signs of acute posttraumatic malalignment of the cervical spine. Anterolisthesis of C7 on T1 measures 3 mm and is likely related to chronic spondylosis. Skull base and vertebrae: No acute fracture. No primary bone lesion or focal pathologic process. Soft tissues and spinal canal: No prevertebral fluid or swelling. No visible canal hematoma. Disc levels: Marked multilevel disc space narrowing with endplate spurring is identified from C2-3 through C7-T1. Upper chest: There is pleural thickening along the medial aspect of the left apex which is only partially visualized but appears similar to 12/10/2021. On the scout radiograph signs of stent graft repair of thoracic aortic aneurysm noted. Other: None IMPRESSION: 1. Large acute right subdural hematoma overlying the inferior right frontal lobe and anterior right temporal lobe with associated mass effect upon the underlying cerebral cortex and 2-3 mm of right to left midline shift. 2. Para falcine subdural hematoma is also identified. 3. Mild hemorrhagic contusion over the right inferior frontal gyrus. 4. No evidence for facial bone fracture or dislocation. 5. No evidence for acute cervical spine fracture or subluxation. 6. Marked multilevel degenerative disc disease within  the cervical spine. Critical Value/emergent results were called by telephone at the time of interpretation on 02/12/2022 at 6:53 am to provider [facility] , who verbally acknowledged these results. Electronically Signed   By: Kerby Moors M.D.   On: 02/12/2022 06:53     Assessment/Plan:    1. Subdural hemorrhage: Mechanical fall.  CT head with large acute right SDH overlying frontal and temporal lobes, mass effect with 2-3 mm midline shift.  Patient alert/oriented, no gross motor deficit.  INR 2.1.  Neurosurgery following.  - Initial conservative management, will give vitamin K 10 mg IV and no Kcentra.  No operative intervention for now.  - Repeat CT head for progression in 4 hrs.  If further progression, will need more aggressive management.  - Pain control narcotics.  - Gradually lower MAP to < 90.  Will give IV hydralazine 10 mg now and use q4 hrs prn if unable to take his po meds.  Currently only mild nausea so may be able to take po regimen.  2. Wrist fracture: Triquetral fracture on left.  3. Chronic systolic HF: Ischemic cardiomyopathy.  Echo (2/16) with EF 25%. S/p [employer] ICD. Echo 3/18. EF 20-25%. Echo 04/18/18: EF 20-25%, grade 2 DD.  s/p HM-III VAD at [facility] 6/21. NYHA class II at baseline. MAP  elevated 110s.  - Hold diuretic for now.  - Continue Entresto 97/103 bid if able to take po.  - Continue spiro 25 daily if able to take po. - Continue Farxiga 5 daily if able to take po. - Continue carvedilol 6.25 bid if able to take po. - Continue hydral 100 tid and Imdur 30 daily if able to take po. - Currently on transplant list at [facility] 3. CAD s/p CABG 2006: No chest pain.  4. VAD management: VAD interrogated personally. Parameters stable. - Need LDH.  - Will have to hold warfarin for now and will reverse INR with vitamin K as above.  Increased risk with LVAD but no choice with SDH. Will be off warfarin for several days.  5. CKD 3a in setting of solitary kidney s/p  dissection: Creatinine stable 1.47.   5. HTN: MAP elevated 110s, history of HTN on high doses of po meds at home.  - Will use hydralazine IV prn if unable to take po.  - Currently only mild nausea, will give pos if able.     I reviewed the LVAD parameters from today, and compared the results to the patient's prior recorded data.  No programming changes were made.  The LVAD is functioning within specified parameters.  The patient performs LVAD self-test daily.  LVAD interrogation was negative for any significant power changes, alarms or PI events/speed drops.  LVAD equipment check completed and is in good working order.  Back-up equipment present.   LVAD education done on emergency procedures and precautions and reviewed exit site care.  Length of Stay: 0  Loralie Champagne, MD 02/12/2022, 9:52 AM  VAD Team Pager [phone] (7am - 7am) +++VAD ISSUES ONLY+++   Advanced Heart Failure Team Pager [phone] (M-F; [place])  Please contact [facility] for night-coverage after hours (5p -7a ) and weekends on [email or web address] for all non- LVAD Issues

## 2022-02-12 NOTE — ED Notes (Signed)
Contacted LVAD nurse

## 2022-02-12 NOTE — ED Provider Notes (Signed)
[facility] EMERGENCY DEPARTMENT Provider Note   CSN: [id number] Arrival date & time: 02/12/22  0537     History  Chief Complaint  Patient presents with   Fall    Chad [name]. is a 61 y.o. male.  Presented to the emergency department with concern for head trauma.  Patient states that he fell while at a gas station.  States that he tripped over something, struck his head.  Also thinks he hit his left hand/left wrist.  Noted bleeding from his lip.  Bleeding has since stopped.  No loss of consciousness, no headache or vomiting.  Patient is LVAD patient, followed by both [facility] and [facility].  On Coumadin.  HPI     Home Medications Prior to Admission medications   Medication Sig Start Date End Date Taking? Authorizing Provider  acetaminophen (TYLENOL) 325 MG tablet Take 2 tablets (650 mg total) by mouth every 4 (four) hours as needed for headache or mild pain. 05/03/20  Yes Clegg, Amy D, NP  allopurinol (ZYLOPRIM) 100 MG tablet Take 1 tablet (100 mg total) by mouth daily. 10/18/21  Yes Bensimhon, Shaune Pascal, MD  alum & mag hydroxide-simeth (MAALOX/MYLANTA) 200-200-20 MG/5ML suspension Take 15 mLs by mouth every 6 (six) hours as needed for indigestion or heartburn. 05/03/20  Yes Clegg, Amy D, NP  amLODipine (NORVASC) 5 MG tablet Take 5 mg by mouth daily. 01/15/22  Yes [provider]  aspirin EC 81 MG tablet Take 81 mg by mouth every evening.   Yes [provider]  carvedilol (COREG) 6.25 MG tablet Take 1 tablet (6.25 mg total) by mouth 2 (two) times daily with a meal. If systolic is less than 90 you may hold Coreg until systolic is greater than 90, then resume taking 10/21/21  Yes Bensimhon, Shaune Pascal, MD  colchicine 0.6 MG tablet Take 1 tablet (0.6 mg total) by mouth daily. 11/03/21  Yes Bensimhon, Shaune Pascal, MD  FARXIGA 5 MG TABS tablet Take 5 mg by mouth every morning. 02/15/21  Yes [provider]  ferrous sulfate 325 (65 FE) MG tablet Take  325 mg by mouth daily with breakfast.   Yes [provider]  fluticasone (FLONASE) 50 MCG/ACT nasal spray Place 2 sprays into both nostrils daily as needed for allergies or rhinitis.    Yes [provider]  gabapentin (NEURONTIN) 300 MG capsule Take 1 capsule (300 mg total) by mouth at bedtime. Patient taking differently: Take 300 mg by mouth 3 (three) times daily. 08/25/20  Yes Bensimhon, Shaune Pascal, MD  hydrALAZINE (APRESOLINE) 100 MG tablet Take 100 mg by mouth 3 (three) times daily. 01/19/21  Yes [provider]  isosorbide mononitrate (IMDUR) 30 MG 24 hr tablet Take 60 mg by mouth in the morning and at bedtime. 03/06/21  Yes [provider]  KLOR-CON M20 20 MEQ tablet TAKE 1 TABLET BY MOUTH TWICE DAILY. TAKE  AN  EXTRA  2  TABLETS  WHEN  YOU  TAKE  METOLAZONE Patient taking differently: Take 20 mEq by mouth daily as needed (with toresemide as needed). 11/28/21  Yes Bensimhon, Shaune Pascal, MD  methocarbamol (ROBAXIN) 750 MG tablet Take 750 mg by mouth daily. 02/24/21  Yes [provider]  naloxone (NARCAN) nasal spray 4 mg/0.1 mL Place 1 spray into the nose as needed (accidental overdose).   Yes [provider]  nitroGLYCERIN (NITROSTAT) 0.4 MG SL tablet DISSOLVE ONE TABLET UNDER THE TONGUE EVERY 5 MINUTES AS NEEDED FOR CHEST PAIN. MAY  TAKE IF DIASTOLIC BP IS GREATER THAN 90 Patient taking differently: Place 0.4 mg under the tongue every 5 (five) minutes as needed for chest pain. 01/30/22  Yes Bensimhon, Shaune Pascal, MD  Oxycodone HCl 20 MG TABS Take 20 mg by mouth every 6 (six) hours. 12/22/19  Yes [provider]  sacubitril-valsartan (ENTRESTO) 97-103 MG Take 1 tablet by mouth 2 (two) times daily.   Yes [provider]  spironolactone (ALDACTONE) 25 MG tablet Take 25 mg by mouth daily.   Yes [provider]  torsemide (DEMADEX) 20 MG tablet Take 1 tablet (20 mg total) by mouth every Monday, Wednesday, and Friday. Patient taking  differently: Take 20 mg by mouth daily as needed (fluid). 04/04/21  Yes Bensimhon, Shaune Pascal, MD  warfarin (COUMADIN) 2 MG tablet Take 8 mg by mouth at bedtime. 01/09/22  Yes [provider]  Alirocumab (PRALUENT) 75 MG/ML SOAJ Inject 75 mg into the skin every 14 (fourteen) days. Patient not taking: Reported on 02/12/2022 09/03/20   Bensimhon, Shaune Pascal, MD      Allergies    Contrast media [iodinated contrast media], Iohexol, Lipitor [atorvastatin calcium], Shellfish allergy, Sulfa antibiotics, Sulfonamide derivatives, Almond oil, Metrizamide, Zocor [simvastatin], Food, Latex, and Peach [prunus persica]    Review of Systems   Review of Systems  Constitutional:  Negative for chills and fever.  HENT:  Positive for facial swelling and nosebleeds. Negative for ear pain and sore throat.   Eyes:  Negative for pain and visual disturbance.  Respiratory:  Negative for cough and shortness of breath.   Cardiovascular:  Negative for chest pain and palpitations.  Gastrointestinal:  Negative for abdominal pain and vomiting.  Genitourinary:  Negative for dysuria and hematuria.  Musculoskeletal:  Negative for arthralgias and back pain.  Skin:  Negative for color change and rash.  Neurological:  Negative for seizures and syncope.  All other systems reviewed and are negative.  Physical Exam Updated Vital Signs Pulse 81    Temp 97.7 F (36.5 C) (Temporal)    Resp 16    Ht 5\' 11"  (1.803 m)    Wt 85.8 kg    SpO2 95%    BMI 26.38 kg/m  Physical Exam Vitals and nursing note reviewed.  Constitutional:      General: He is not in acute distress.    Appearance: He is well-developed.  HENT:     Head: Normocephalic.     Comments: Some tenderness to right forehead/superior occiput Superficial laceration to upper lip, no involvement of vermilion border, no ongoing bleeding Small dried blood in right nare, no nasal septal hematoma Eyes:     Conjunctiva/sclera: Conjunctivae normal.  Cardiovascular:      Rate and Rhythm: Normal rate and regular rhythm.     Heart sounds: No murmur heard. Pulmonary:     Effort: Pulmonary effort is normal. No respiratory distress.     Breath sounds: Normal breath sounds.  Abdominal:     Palpations: Abdomen is soft.     Tenderness: There is no abdominal tenderness.  Musculoskeletal:        General: No swelling.     Cervical back: Neck supple.     Comments: Back: no C, T, L spine TTP, no step off or deformity RUE: Some tenderness to palpation in the hand, no deformity, normal joint ROM, radial pulse intact, distal sensation and motor intact LUE: Some tenderness to the wrist, hand, no deformity, normal joint ROM, radial pulse intact, distal sensation and motor intact  RLE:  no TTP throughout, no deformity, normal joint ROM, distal pulse, sensation and motor intact LLE: no TTP throughout, no deformity, normal joint ROM, distal pulse, sensation and motor intact  Skin:    General: Skin is warm and dry.     Capillary Refill: Capillary refill takes less than 2 seconds.  Neurological:     General: No focal deficit present.     Mental Status: He is alert.  Psychiatric:        Mood and Affect: Mood normal.    ED Results / Procedures / Treatments   Labs (all labs ordered are listed, but only abnormal results are displayed) Labs Reviewed  CBC WITH DIFFERENTIAL/PLATELET - Abnormal; Notable for the following components:      Result Value   Hemoglobin 12.5 (*)    Platelets 133 (*)    All other components within normal limits  BASIC METABOLIC PANEL - Abnormal; Notable for the following components:   Sodium 134 (*)    Glucose, Bld 103 (*)    Creatinine, Ser 1.47 (*)    GFR, Estimated 55 (*)    All other components within normal limits  PROTIME-INR - Abnormal; Notable for the following components:   Prothrombin Time 23.5 (*)    INR 2.1 (*)    All other components within normal limits  I-STAT CHEM 8, ED - Abnormal; Notable for the following components:    Creatinine, Ser 1.60 (*)    Glucose, Bld 100 (*)    All other components within normal limits  RESP PANEL BY RT-PCR (FLU A&B, COVID) [id number]    EKG None  Radiology DG Wrist Complete Left  Result Date: 02/12/2022 CLINICAL DATA:  Left wrist pain.  Fall. EXAM: LEFT WRIST - COMPLETE 3+ VIEW COMPARISON:  None. FINDINGS: There is an os ossific density along the dorsum of the wrist which may reflect underlying triquetral fracture. Mild overlying soft tissue swelling identified. No additional signs of fracture or dislocation. Degenerative changes noted at the basilar joint. IMPRESSION: 1. Ossific density along the dorsum of the wrist may reflect underlying triquetral fracture. Correlate for any focal tenderness over this area. 2. Basilar joint osteoarthritis. Electronically Signed   By: Kerby Moors M.D.   On: 02/12/2022 06:22   CT Head Wo Contrast  Result Date: 02/12/2022 CLINICAL DATA:  Status post fall EXAM: CT HEAD WITHOUT CONTRAST CT MAXILLOFACIAL WITHOUT CONTRAST CT CERVICAL SPINE WITHOUT CONTRAST TECHNIQUE: Multidetector CT imaging of the head, cervical spine, and maxillofacial structures were performed using the standard protocol without intravenous contrast. Multiplanar CT image reconstructions of the cervical spine and maxillofacial structures were also generated. RADIATION DOSE REDUCTION: This exam was performed according to the departmental dose-optimization program which includes automated exposure control, adjustment of the mA and/or kV according to patient size and/or use of iterative reconstruction technique. COMPARISON:  None. 09/02/2021 FINDINGS: CT HEAD FINDINGS Brain: There is a large acute right subdural hematoma which overlies the inferior right frontal lobe and anterior right temporal lobe. This measures 1.3 cm in thickness measured along the undersurface of the inferior right frontal lobe, image 20/5. There is associated mass effect upon the underlying cerebral cortex with effacement  of the sulci. 2-3 mm of right to left midline shift noted. Para falcine subdural hematoma is also identified. This measures approximately 6 mm in thickness, image 26/3. Mild hemorrhagic contusion noted over the right inferior frontal gyrus, is suspected. The ventricles are normal in volume. No intraventricular hemorrhage identified. There is mild diffuse low-attenuation within  the subcortical and periventricular white matter compatible with chronic microvascular disease. 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 Vascular: No hyperdense vessel or unexpected calcification. Skull: Normal. Negative for fracture or focal lesion. Other: None CT MAXILLOFACIAL FINDINGS Osseous: No fracture or mandibular dislocation. No destructive process. Orbits: Negative. No traumatic or inflammatory finding. Sinuses: Clear. Soft tissues: Negative. CT CERVICAL SPINE FINDINGS Alignment: There is no signs of acute posttraumatic malalignment of the cervical spine. Anterolisthesis of C7 on T1 measures 3 mm and is likely related to chronic spondylosis. Skull base and vertebrae: No acute fracture. No primary bone lesion or focal pathologic process. Soft tissues and spinal canal: No prevertebral fluid or swelling. No visible canal hematoma. Disc levels: Marked multilevel disc space narrowing with endplate spurring is identified from C2-3 through C7-T1. Upper chest: There is pleural thickening along the medial aspect of the left apex which is only partially visualized but appears similar to 12/10/2021. On the scout radiograph signs of stent graft repair of thoracic aortic aneurysm noted. Other: None IMPRESSION: 1. Large acute right subdural hematoma overlying the inferior right frontal lobe and anterior right temporal lobe with associated mass effect upon the underlying cerebral cortex and 2-3 mm of right to left midline shift. 2. Para falcine subdural hematoma is also identified. 3. Mild hemorrhagic contusion over  the right inferior frontal gyrus. 4. No evidence for facial bone fracture or dislocation. 5. No evidence for acute cervical spine fracture or subluxation. 6. 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 Critical Value/emergent results were called by telephone at the time of interpretation on 02/12/2022 at 6:53 am to provider [facility] , who verbally acknowledged these results. Electronically Signed   By: Kerby Moors M.D.   On: 02/12/2022 06:53   CT Cervical Spine Wo Contrast  Result Date: 02/12/2022 CLINICAL DATA:  Status post fall EXAM: CT HEAD WITHOUT CONTRAST CT MAXILLOFACIAL WITHOUT CONTRAST CT CERVICAL SPINE WITHOUT CONTRAST TECHNIQUE: Multidetector CT imaging of the head, cervical spine, and maxillofacial structures were performed using the standard protocol without intravenous contrast. Multiplanar CT image reconstructions of the cervical spine and maxillofacial structures were also generated. RADIATION DOSE REDUCTION: This exam was performed according to the departmental dose-optimization program which includes automated exposure control, adjustment of the mA and/or kV according to patient size and/or use of iterative reconstruction technique. COMPARISON:  None. 09/02/2021 FINDINGS: CT HEAD FINDINGS Brain: There is a large acute right subdural hematoma which overlies the inferior right frontal lobe and anterior right temporal lobe. This measures 1.3 cm in thickness measured along the undersurface of the inferior right frontal lobe, image 20/5. There is associated mass effect upon the underlying cerebral cortex with effacement of the sulci. 2-3 mm of right to left midline shift noted. Para falcine subdural hematoma is also identified. This measures approximately 6 mm in thickness, image 26/3. Mild hemorrhagic contusion noted over the right inferior frontal gyrus, is suspected. The ventricles are normal in volume. No intraventricular hemorrhage identified. There is mild  diffuse low-attenuation within the subcortical and periventricular white matter compatible with chronic microvascular disease. 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 Vascular: No hyperdense vessel or unexpected calcification. Skull: Normal. Negative for fracture or focal lesion. Other: None CT MAXILLOFACIAL FINDINGS Osseous: No fracture or mandibular dislocation. No destructive process. Orbits: Negative. No traumatic or inflammatory finding. Sinuses: Clear. Soft tissues: Negative. CT CERVICAL SPINE FINDINGS Alignment: There is no signs of acute posttraumatic malalignment of the cervical  spine. Anterolisthesis of C7 on T1 measures 3 mm and is likely related to chronic spondylosis. Skull base and vertebrae: No acute fracture. No primary bone lesion or focal pathologic process. Soft tissues and spinal canal: No prevertebral fluid or swelling. No visible canal hematoma. Disc levels: Marked multilevel disc space narrowing with endplate spurring is identified from C2-3 through C7-T1. Upper chest: There is pleural thickening along the medial aspect of the left apex which is only partially visualized but appears similar to 12/10/2021. On the scout radiograph signs of stent graft repair of thoracic aortic aneurysm noted. Other: None IMPRESSION: 1. Large acute right subdural hematoma overlying the inferior right frontal lobe and anterior right temporal lobe with associated mass effect upon the underlying cerebral cortex and 2-3 mm of right to left midline shift. 2. Para falcine subdural hematoma is also identified. 3. Mild hemorrhagic contusion over the right inferior frontal gyrus. 4. No evidence for facial bone fracture or dislocation. 5. No evidence for acute cervical spine fracture or subluxation. 6. 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 Critical Value/emergent results were called by telephone at the time of interpretation on 02/12/2022 at 6:53 am to  provider [facility] , who verbally acknowledged these results. Electronically Signed   By: Kerby Moors M.D.   On: 02/12/2022 06:53   DG Chest Portable 1 View  Result Date: 02/12/2022 CLINICAL DATA:  61 year old male status post fall on blood thinners. History of endograft treated thoracic aortic aneurysm. LVAD. EXAM: PORTABLE CHEST 1 VIEW COMPARISON:  Noncontrast chest CT 12/10/2021 and earlier. FINDINGS: Portable AP semi upright view at 0635 hours. Long segment thoracic aortic endograft from the arch through the level of the diaphragm appears stable since last year with unchanged abnormal aortic contour/aneurysm. Superimposed LVAD is stable. Prior CABG and aortic valve replacement. Stable cardiomegaly and mediastinal contours. No pneumothorax or pulmonary edema. Stable blunting of the left costophrenic angle. No right pleural effusion. No new pulmonary opacity. No acute osseous abnormality identified. Negative visible bowel gas. IMPRESSION: 1. No acute cardiopulmonary abnormality or acute traumatic injury identified. 2. Stable chronic cardiomegaly, thoracic aortic aneurysm with endograft, LVAD, small left pleural effusion. Electronically Signed   By: Genevie Ann M.D.   On: 02/12/2022 07:13   DG Shoulder Left Portable  Result Date: 02/12/2022 CLINICAL DATA:  61 year old male status post fall on blood thinners. EXAM: LEFT SHOULDER COMPARISON:  Portable chest today.  Left humerus series 09/02/2021. FINDINGS: Single AP view of the left shoulder. Partially visible LVAD generator device, thoracic aortic aneurysm and endograft. Stable alignment about the left shoulder on this single view. Proximal left humerus appears intact. No acute osseous abnormality identified. IMPRESSION: No acute osseous abnormality identified. Electronically Signed   By: Genevie Ann M.D.   On: 02/12/2022 07:15   DG Hand Complete Left  Result Date: 02/12/2022 CLINICAL DATA:  61 year old male status post fall on blood thinners. EXAM:  LEFT HAND - COMPLETE 3+ VIEW COMPARISON:  Left wrist series today. FINDINGS: Left index finger pulse oximeter artifact. Bone mineralization is within normal limits. Small chronic ossific fragment at the dorsal carpal bones as seen on the wrist series today. Distal radius and ulna appear intact. First CMC joint space loss, osteoarthritis. Metacarpals and phalanges appear intact. IMPRESSION: 1. No acute fracture or dislocation identified about the left hand. First CMC osteoarthritis. 2. Possible triquetrum fracture as on the wrist series. Electronically Signed   By: Genevie Ann M.D.   On: 02/12/2022 07:18   DG Hand  Complete Right  Result Date: 02/12/2022 CLINICAL DATA:  61 year old male status post fall on blood thinners. EXAM: RIGHT HAND - COMPLETE 3+ VIEW COMPARISON:  None. FINDINGS: Bone mineralization is within normal limits. There is no evidence of fracture or dislocation. There is no evidence of arthropathy or other focal bone abnormality. No discrete soft tissue injury. IMPRESSION: Negative. Electronically Signed   By: Genevie Ann M.D.   On: 02/12/2022 07:17   CT Maxillofacial Wo Contrast  Result Date: 02/12/2022 CLINICAL DATA:  Status post fall EXAM: CT HEAD WITHOUT CONTRAST CT MAXILLOFACIAL WITHOUT CONTRAST CT CERVICAL SPINE WITHOUT CONTRAST TECHNIQUE: Multidetector CT imaging of the head, cervical spine, and maxillofacial structures were performed using the standard protocol without intravenous contrast. Multiplanar CT image reconstructions of the cervical spine and maxillofacial structures were also generated. RADIATION DOSE REDUCTION: This exam was performed according to the departmental dose-optimization program which includes automated exposure control, adjustment of the mA and/or kV according to patient size and/or use of iterative reconstruction technique. COMPARISON:  None. 09/02/2021 FINDINGS: CT HEAD FINDINGS Brain: There is a large acute right subdural hematoma which overlies the inferior right  frontal lobe and anterior right temporal lobe. This measures 1.3 cm in thickness measured along the undersurface of the inferior right frontal lobe, image 20/5. There is associated mass effect upon the underlying cerebral cortex with effacement of the sulci. 2-3 mm of right to left midline shift noted. Para falcine subdural hematoma is also identified. This measures approximately 6 mm in thickness, image 26/3. Mild hemorrhagic contusion noted over the right inferior frontal gyrus, is suspected. The ventricles are normal in volume. No intraventricular hemorrhage identified. There is mild diffuse low-attenuation within the subcortical and periventricular white matter compatible with chronic microvascular disease. 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 Vascular: No hyperdense vessel or unexpected calcification. Skull: Normal. Negative for fracture or focal lesion. Other: None CT MAXILLOFACIAL FINDINGS Osseous: No fracture or mandibular dislocation. No destructive process. Orbits: Negative. No traumatic or inflammatory finding. Sinuses: Clear. Soft tissues: Negative. CT CERVICAL SPINE FINDINGS Alignment: There is no signs of acute posttraumatic malalignment of the cervical spine. Anterolisthesis of C7 on T1 measures 3 mm and is likely related to chronic spondylosis. Skull base and vertebrae: No acute fracture. No primary bone lesion or focal pathologic process. Soft tissues and spinal canal: No prevertebral fluid or swelling. No visible canal hematoma. Disc levels: Marked multilevel disc space narrowing with endplate spurring is identified from C2-3 through C7-T1. Upper chest: There is pleural thickening along the medial aspect of the left apex which is only partially visualized but appears similar to 12/10/2021. On the scout radiograph signs of stent graft repair of thoracic aortic aneurysm noted. Other: None IMPRESSION: 1. Large acute right subdural hematoma overlying the inferior  right frontal lobe and anterior right temporal lobe with associated mass effect upon the underlying cerebral cortex and 2-3 mm of right to left midline shift. 2. Para falcine subdural hematoma is also identified. 3. Mild hemorrhagic contusion over the right inferior frontal gyrus. 4. No evidence for facial bone fracture or dislocation. 5. No evidence for acute cervical spine fracture or subluxation. 6. 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 Critical Value/emergent results were called by telephone at the time of interpretation on 02/12/2022 at 6:53 am to provider [facility] , who verbally acknowledged these results. Electronically Signed   By: Kerby Moors M.D.   On: 02/12/2022 06:53    Procedures .Critical Care  Performed by: Lucrezia Starch, MD Authorized by: Lucrezia Starch, MD   Critical care provider statement:    Critical care time (minutes):  56   Critical care was necessary to treat or prevent imminent or life-threatening deterioration of the following conditions:  CNS failure or compromise   Critical care was time spent personally by me on the following activities:  Development of treatment plan with patient or surrogate, discussions with consultants, evaluation of patient's response to treatment, examination of patient, ordering and review of laboratory studies, ordering and review of radiographic studies, ordering and performing treatments and interventions, pulse oximetry, re-evaluation of patient's condition and review of old charts    Medications Ordered in ED Medications  ondansetron (ZOFRAN) injection 4 mg (4 mg Intravenous Given by Other 02/12/22 [id number])    ED Course/ Medical Decision Making/ A&P                           Medical Decision Making Amount and/or Complexity of Data Reviewed Labs: ordered. Radiology: ordered.  Risk Prescription drug management.   61 year old gentleman with history of heart failure, LVAD patient  presenting to ER for head trauma.  Mechanical fall, ground-level fall.  Level 2 trauma.  On exam noted tenderness to right scalp/occiput, small lac to lip, also with some pain in his left wrist/hand and right hand.  CT head independently reviewed.  Interpreted.  Concern for large acute subdural hematoma as well as hemorrhagic contusion.  I discussed the case with Dr. Marcelle Smiling with cardiology and with Proctor with [facility]. Narcisse states he will asked the heart failure team to come evaluate patient and likely admit.  States his anticoagulation cannot be reversed due to LVAD.  Confirmed with Dr. Aundra Dubin that our heart failure team will come evaluate and admit patient.  Plain film x-rays of affected extremities notable for possible left triquetral fracture.  Will place in short arm splint.  Can follow-up on an outpatient basis with orthopedics for this fracture.  Patient and his niece at bedside updated throughout stay.  Additional history obtained from chart review, review of recent outpatient cardiology notes  -extensive past medical history including history of CAD, aortic dissection, severe heart failure s/p LVAD placement at [facility].  While awaiting cardiology to come see and admit patient and awaiting NSGY to come and evaluate patient, signed out to Dr. Karle Starch. An[facility]iapate HF admit as primary with [facility].        Final Clinical Impression(s) / ED Diagnoses Final diagnoses:  Fall  Closed nondisplaced fracture of triquetrum of left wrist, initial encounter  SDH (subdural hematoma)  Chronic anticoagulation    Rx / DC Orders ED Discharge Orders     None         Lucrezia Starch, MD 02/12/22 [phone]

## 2022-02-12 NOTE — ED Notes (Signed)
I notified Rennie Natter LVAD coordinator at [facility] and gave her SBAR.

## 2022-02-12 NOTE — ED Notes (Signed)
Ortho tech notified me that pt refuses arm splint at this time, due to thinking the area where the IV was removed will keep bleeding. The ortho tech stated he will pass on to the next shift to try to place the splint later today.

## 2022-02-12 NOTE — Discharge Summary (Addendum)
Discharge Summary    Patient ID: Chad [name]. MRN: [id number]; DOB: [birth year]  Admit date: 02/12/2022 Discharge date: 02/12/2022  PCP:  Jolaine Artist, MD   [facility] Providers Cardiologist:  Glori Bickers, MD  Electrophysiologist:  Cristopher Peru, MD       Discharge Diagnoses    Principal Problem:   Subdural hemorrhage [facility]) Active Problems:   Hypertension   CAD (coronary artery disease) of artery bypass graft   Chronic combined systolic and diastolic CHF (congestive heart failure) (HCC)   CKD (chronic kidney disease) stage 3, GFR 30-59 ml/min (HCC)   History of aortic dissection   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 _____________   History of Present Illness     Chad [name]. is a 61 y.o. male with severe HTN, CAD s/p MI with CABG 2006 and DES to native PDA in 3300, systolic HF EF 76-22% s/p [employer] ICD. 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 Underwent high risk HM-III VAD placement at [facility] 6/21.    Echo 3/22 EF 20% RV moderate HK.  RHC with exercise 3/22 on 5600  Rest RA 9 PCWP 12 CI 2.4 MAP 100 Exercise PCWP 27 CI 2.5 pVO2 7.4 slope 42   Remains listed for transplant at [facility]. Admitted to [facility] 12/22 with CP. Trops 105->149->158. CT TAA with stable findings from prior including aortic valve thrombus. INR goal increased to 2.5 - 3.5.    Today: He was stable prior to admission, walking without dyspnea.  Early this morning, was filling up car to gas station and tripped at the gas pump, falling and striking head and left wrist on concrete.  He developed headache so came to ER. Imaging showed  subdural hematoma so cardiology and neurosurgery were consulted. INR was 2.1, Hgb 12.5 and platelet count 133 by formal CBC.  Hospital Course     Upon cardiology evaluation by Dr. Aundra Dubin (Advanced Heart Failure team) this morning, the patient reported severe HA with photophobia and nausea. He was otherwise alert/oriented, answers all questions, no gross neurologic abnormalities on exam. Wrist film showed triquetral fracture on left. Head CT showed large acute right SDH overlying frontal and temporal lobes, mass effect with 2-3 mm midline shift. Warfarin and aspirin were held. Creatinine was 1.47, felt stable compared to prior. Diuretic was put on hold on initial admit orders. He did not otherwise receive any of his oral medicines while here today as he was pending neurosugery evaluation. He was given vitamin K and seen by neurosurgery. Initial plan was for conservative management. Repeat CT head resulting at 11:29am showed worsening midline shift. Dr. Aundra Dubin discussed with neurosurgeon Dr. Zada Finders and the patient was given Kcentra for full reversal. Per Dr. Claris Gladden report this afternoon, he spoke with [facility] - this is a [facility] LVAD patient and they want him transferred to [facility] to observe subdural to see if he needs further intervention. He remains neurologically in tact at this time, A+Ox3. The patient will be transferred in stable condition by [facility]. Last HR 82, MAP 82, pulse ox 97% 2L.   Regarding med reconciliation at discharge: - Aspirin and warfarin were discontinued on admission here. -  Torsemide and potassium put on hold by Dr. Aundra Dubin. - Methocarbamol held in context of treatment above. - Maalox held due to CKD. - Note home gabapentin dose [facility] taking 2-3 times daily so this will need to be reviewed at time of acceptance at outside facility.  - Otherwise the med list below reflects the rest of the medications  [facility] His candidacy for oral medications will need to [facility] today he received fentanyl 550m for pain control at 802, morphine 12m45mt 1258, hydralazine 70m63m at 1256, Zofran at 0748 and 0945, IV vitamin K 70mg812mrting at 957, and K Centra 1628 units at 1320. He is pending another 12mg o54morphine at time of signing discharge summary due to continued significant headache.    LVAD INTERROGATION:  HeartMate II LVAD:  Flow 4.5 liters/min, speed 5800, power 4.6, PI 4.4.  8 PI events last day    _____________  Discharge Vitals Blood pressure dopplered MAP 82, pulse 83, temperature 97.7 F (36.5 C), temperature source Temporal, resp. Rate 16, height 5' 11"  (1.803 m), weight 85.8 kg, SpO2 97%. 2L/min Filed Weights   02/12/22 0555  Weight: 85.8 kg    Labs & Radiologic Studies    CBC Recent Labs    02/12/22 0559 02/12/22 0601  WBC  --  4.5  NEUTROABS  --  2.5  HGB 14.3 12.5*  HCT 42.0 40.0  MCV  --  85.3  PLT  --  133*  409sic Metabolic Panel Recent Labs    02/12/22 0559 02/12/22 0601  NA 137 134*  K 4.3 4.4  CL 104 101  CO2  --  23  GLUCOSE 100* 103*  BUN 18 17  CREATININE 1.60* 1.47*  CALCIUM  --  9.0   _____________  DG Wrist Complete Left  Result Date: 02/12/2022 CLINICAL DATA:  Left wrist pain.  Fall. EXAM: LEFT WRIST - COMPLETE 3+ VIEW COMPARISON:  None. FINDINGS: There is an os ossific density along the dorsum of the wrist which may reflect underlying triquetral fracture. Mild overlying soft tissue swelling identified. No additional signs of fracture or dislocation. Degenerative changes noted at the basilar joint. IMPRESSION: 1. Ossific density along the dorsum of the wrist may reflect underlying triquetral fracture. Correlate for any focal tenderness over this area. 2. Basilar joint osteoarthritis. Electronically Signed   By: TaylorKerby Moors   On: 02/12/2022 06:22   CT HEAD WO CONTRAST (5MM)  Addendum Date: 02/12/2022   ADDENDUM REPORT: 02/12/2022 11:29 ADDENDUM: Study discussed by telephone with MadisoAndreas Blowern 02/12/2022 at 1124 hours. Electronically Signed   By: H  HalGenevie Ann  On: 02/12/2022 11:29   Result Date: 02/12/2022 CLINICAL DATA:  61 yea64old male status post fall on blood thinners with subdural hematoma, hemorrhagic contusion. LVAD patient. EXAM: CT HEAD WITHOUT CONTRAST TECHNIQUE: Contiguous axial images were obtained from the base of the skull through the vertex without intravenous contrast. RADIATION DOSE REDUCTION: This exam was performed according to the departmental dose-optimization program which includes automated exposure control, adjustment of the mA and/or kV according to patient size and/or use of iterative reconstruction technique. COMPARISON:  Head CT 0620 hours today. FINDINGS: Brain: Lobulated hyperdense subdural hematoma has increased since 0620 hours today, is now more holo hemispheric (series 5, image 43), with continued areas of mixed  density. The most lobulated component at the right operculum is now 14 mm thickness (previously 12 mm), and along most of the right convexity the subdural blood is 5-7 mm thick, which is noticeably increased. As result there is increased intracranial mass effect and leftward midline shift now 5 mm (previously trace). Increased mass effect on the right lateral ventricle. No ventriculomegaly. Less convincing evidence now of superimposed hemorrhagic contusion in the right inferior frontal gyrus, although could be obscured by the increased subdural blood. Mostly anterior para falcine subdural hematoma thickness up to 8 mm has not significantly changed. No intraventricular or subarachnoid hemorrhage identified. Basilar cisterns remain patent. Stable gray-white matter differentiation throughout the brain. Chronic small right cerebellar infarct. No cortically based acute infarct  identified. Vascular: Calcified atherosclerosis at the skull base. Skull: No fracture identified. Sinuses/Orbits: Visualized paranasal sinuses and mastoids are clear. Other: No discrete orbit or scalp soft tissue injury. IMPRESSION: 1. Enlarged right convexity subdural hematoma since 0620 hours today, extending farther across the hemisphere now and ranging from 5 mm up to 14 mm thick. No skull fracture identified. 2. Subsequent increased intracranial mass effect and leftward midline shift now 5 mm (previously trace). No ventriculomegaly. Basilar cisterns remain normal. 3. Stable smaller para falcine SDH. Less evidence of a hemorrhagic contusion in the right inferior frontal gyrus, might have been lobulated subdural blood. 4. Chronic small right cerebellar infarct. Electronically Signed: By: Genevie Ann M.D. On: 02/12/2022 11:18   CT Head Wo Contrast  Result Date: 02/12/2022 CLINICAL DATA:  Status post fall EXAM: CT HEAD WITHOUT CONTRAST CT MAXILLOFACIAL WITHOUT CONTRAST CT CERVICAL SPINE WITHOUT CONTRAST TECHNIQUE: Multidetector CT imaging of the head, cervical spine, and maxillofacial structures were performed using the standard protocol without intravenous contrast. Multiplanar CT image reconstructions of the cervical spine and maxillofacial structures were also generated. RADIATION DOSE REDUCTION: This exam was performed according to the departmental dose-optimization program which includes automated exposure control, adjustment of the mA and/or kV according to patient size and/or use of iterative reconstruction technique. COMPARISON:  None. 09/02/2021 FINDINGS: CT HEAD FINDINGS Brain: There is a large acute right subdural hematoma which overlies the inferior right frontal lobe and anterior right temporal lobe. This measures 1.3 cm in thickness measured along the undersurface of the inferior right frontal lobe, image 20/5. There is associated mass effect upon the underlying cerebral cortex with effacement of the  sulci. 2-3 mm of right to left midline shift noted. Para falcine subdural hematoma is also identified. This measures approximately 6 mm in thickness, image 26/3. Mild hemorrhagic contusion noted over the right inferior frontal gyrus, is suspected. The ventricles are normal in volume. No intraventricular hemorrhage identified. There is mild diffuse low-attenuation within the subcortical and periventricular white matter compatible with chronic microvascular disease. 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 Vascular: No hyperdense vessel or unexpected calcification. Skull: Normal. Negative for fracture or focal lesion. Other: None CT MAXILLOFACIAL FINDINGS Osseous: No fracture or mandibular dislocation. No destructive process. Orbits: Negative. No traumatic or inflammatory finding. Sinuses: Clear. Soft tissues: Negative. CT CERVICAL SPINE FINDINGS Alignment: There is no signs of acute posttraumatic malalignment of the cervical spine. Anterolisthesis of C7 on T1 measures 3 mm and is likely related to chronic spondylosis. Skull base and vertebrae: No acute fracture. No primary bone lesion or focal pathologic process. Soft tissues and spinal canal: No prevertebral fluid or swelling. No visible canal hematoma. Disc levels: Marked multilevel disc space narrowing with endplate spurring is identified  from C2-3 through C7-T1. Upper chest: There is pleural thickening along the medial aspect of the left apex which is only partially visualized but appears similar to 12/10/2021. On the scout radiograph signs of stent graft repair of thoracic aortic aneurysm noted. Other: None IMPRESSION: 1. Large acute right subdural hematoma overlying the inferior right frontal lobe and anterior right temporal lobe with associated mass effect upon the underlying cerebral cortex and 2-3 mm of right to left midline shift. 2. Para falcine subdural hematoma is also identified. 3. Mild hemorrhagic contusion over the  right inferior frontal gyrus. 4. No evidence for facial bone fracture or dislocation. 5. No evidence for acute cervical spine fracture or subluxation. 6. 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 Critical Value/emergent results were called by telephone at the time of interpretation on 02/12/2022 at 6:53 am to provider [facility] , who verbally acknowledged these results. Electronically Signed   By: Kerby Moors M.D.   On: 02/12/2022 06:53   CT Cervical Spine Wo Contrast  Result Date: 02/12/2022 CLINICAL DATA:  Status post fall EXAM: CT HEAD WITHOUT CONTRAST CT MAXILLOFACIAL WITHOUT CONTRAST CT CERVICAL SPINE WITHOUT CONTRAST TECHNIQUE: Multidetector CT imaging of the head, cervical spine, and maxillofacial structures were performed using the standard protocol without intravenous contrast. Multiplanar CT image reconstructions of the cervical spine and maxillofacial structures were also generated. RADIATION DOSE REDUCTION: This exam was performed according to the departmental dose-optimization program which includes automated exposure control, adjustment of the mA and/or kV according to patient size and/or use of iterative reconstruction technique. COMPARISON:  None. 09/02/2021 FINDINGS: CT HEAD FINDINGS Brain: There is a large acute right subdural hematoma which overlies the inferior right frontal lobe and anterior right temporal lobe. This measures 1.3 cm in thickness measured along the undersurface of the inferior right frontal lobe, image 20/5. There is associated mass effect upon the underlying cerebral cortex with effacement of the sulci. 2-3 mm of right to left midline shift noted. Para falcine subdural hematoma is also identified. This measures approximately 6 mm in thickness, image 26/3. Mild hemorrhagic contusion noted over the right inferior frontal gyrus, is suspected. The ventricles are normal in volume. No intraventricular hemorrhage identified. There is mild  diffuse low-attenuation within the subcortical and periventricular white matter compatible with chronic microvascular disease. 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 Vascular: No hyperdense vessel or unexpected calcification. Skull: Normal. Negative for fracture or focal lesion. Other: None CT MAXILLOFACIAL FINDINGS Osseous: No fracture or mandibular dislocation. No destructive process. Orbits: Negative. No traumatic or inflammatory finding. Sinuses: Clear. Soft tissues: Negative. CT CERVICAL SPINE FINDINGS Alignment: There is no signs of acute posttraumatic malalignment of the cervical spine. Anterolisthesis of C7 on T1 measures 3 mm and is likely related to chronic spondylosis. Skull base and vertebrae: No acute fracture. No primary bone lesion or focal pathologic process. Soft tissues and spinal canal: No prevertebral fluid or swelling. No visible canal hematoma. Disc levels: Marked multilevel disc space narrowing with endplate spurring is identified from C2-3 through C7-T1. Upper chest: There is pleural thickening along the medial aspect of the left apex which is only partially visualized but appears similar to 12/10/2021. On the scout radiograph signs of stent graft repair of thoracic aortic aneurysm noted. Other: None IMPRESSION: 1. Large acute right subdural hematoma overlying the inferior right frontal lobe and anterior right temporal lobe with associated mass effect upon the underlying cerebral cortex and 2-3 mm of right to left midline  shift. 2. Para falcine subdural hematoma is also identified. 3. Mild hemorrhagic contusion over the right inferior frontal gyrus. 4. No evidence for facial bone fracture or dislocation. 5. No evidence for acute cervical spine fracture or subluxation. 6. 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 Critical Value/emergent results were called by telephone at the time of interpretation on 02/12/2022 at 6:53 am to  provider [facility] , who verbally acknowledged these results. Electronically Signed   By: Kerby Moors M.D.   On: 02/12/2022 06:53   DG Chest Portable 1 View  Result Date: 02/12/2022 CLINICAL DATA:  61 year old male status post fall on blood thinners. History of endograft treated thoracic aortic aneurysm. LVAD. EXAM: PORTABLE CHEST 1 VIEW COMPARISON:  Noncontrast chest CT 12/10/2021 and earlier. FINDINGS: Portable AP semi upright view at 0635 hours. Long segment thoracic aortic endograft from the arch through the level of the diaphragm appears stable since last year with unchanged abnormal aortic contour/aneurysm. Superimposed LVAD is stable. Prior CABG and aortic valve replacement. Stable cardiomegaly and mediastinal contours. No pneumothorax or pulmonary edema. Stable blunting of the left costophrenic angle. No right pleural effusion. No new pulmonary opacity. No acute osseous abnormality identified. Negative visible bowel gas. IMPRESSION: 1. No acute cardiopulmonary abnormality or acute traumatic injury identified. 2. Stable chronic cardiomegaly, thoracic aortic aneurysm with endograft, LVAD, small left pleural effusion. Electronically Signed   By: Genevie Ann M.D.   On: 02/12/2022 07:13   DG Shoulder Left Portable  Result Date: 02/12/2022 CLINICAL DATA:  61 year old male status post fall on blood thinners. EXAM: LEFT SHOULDER COMPARISON:  Portable chest today.  Left humerus series 09/02/2021. FINDINGS: Single AP view of the left shoulder. Partially visible LVAD generator device, thoracic aortic aneurysm and endograft. Stable alignment about the left shoulder on this single view. Proximal left humerus appears intact. No acute osseous abnormality identified. IMPRESSION: No acute osseous abnormality identified. Electronically Signed   By: Genevie Ann M.D.   On: 02/12/2022 07:15   DG Hand Complete Left  Result Date: 02/12/2022 CLINICAL DATA:  61 year old male status post fall on blood thinners. EXAM:  LEFT HAND - COMPLETE 3+ VIEW COMPARISON:  Left wrist series today. FINDINGS: Left index finger pulse oximeter artifact. Bone mineralization is within normal limits. Small chronic ossific fragment at the dorsal carpal bones as seen on the wrist series today. Distal radius and ulna appear intact. First CMC joint space loss, osteoarthritis. Metacarpals and phalanges appear intact. IMPRESSION: 1. No acute fracture or dislocation identified about the left hand. First CMC osteoarthritis. 2. Possible triquetrum fracture as on the wrist series. Electronically Signed   By: Genevie Ann M.D.   On: 02/12/2022 07:18   DG Hand Complete Right  Result Date: 02/12/2022 CLINICAL DATA:  61 year old male status post fall on blood thinners. EXAM: RIGHT HAND - COMPLETE 3+ VIEW COMPARISON:  None. FINDINGS: Bone mineralization is within normal limits. There is no evidence of fracture or dislocation. There is no evidence of arthropathy or other focal bone abnormality. No discrete soft tissue injury. IMPRESSION: Negative. Electronically Signed   By: Genevie Ann M.D.   On: 02/12/2022 07:17   LONG TERM MONITOR (3-14 DAYS)  Result Date: 01/22/2022 Patch Wear Time:  13 days and 6 hours (2023-01-03T10:12:47-0500 to 2023-01-16T16:32:52-0500) 1. Sinus rhythm - avg HR of 87 bpm. 2. Ventricular Pacing was present. 3. First Degree AV Block and 2nd degree Type I block (Wenckebach) were present. 4. 176 runs if nonsustained VT occurred, the run with the  fastest interval lasting 6 beats with a max rate of 235 bpm, the longest lasting 10 beats with an avg rate of 107 bpm. 5. 18 runs of SVT occurred, the run with the fastest interval lasting 7 beats with a max rate of 167 bpm, the longest lasting 14 beats with an avg rate of 119 bpm. 6. Rare PACs 7. Frequent  PVCs  (7.5%, 810175) 8. Ventricular Bigeminy and Trigeminy were present. 9. No events in diary. Glori Bickers, MD 6:49 PM  CT Maxillofacial Wo Contrast  Result Date: 02/12/2022 CLINICAL DATA:   Status post fall EXAM: CT HEAD WITHOUT CONTRAST CT MAXILLOFACIAL WITHOUT CONTRAST CT CERVICAL SPINE WITHOUT CONTRAST TECHNIQUE: Multidetector CT imaging of the head, cervical spine, and maxillofacial structures were performed using the standard protocol without intravenous contrast. Multiplanar CT image reconstructions of the cervical spine and maxillofacial structures were also generated. RADIATION DOSE REDUCTION: This exam was performed according to the departmental dose-optimization program which includes automated exposure control, adjustment of the mA and/or kV according to patient size and/or use of iterative reconstruction technique. COMPARISON:  None. 09/02/2021 FINDINGS: CT HEAD FINDINGS Brain: There is a large acute right subdural hematoma which overlies the inferior right frontal lobe and anterior right temporal lobe. This measures 1.3 cm in thickness measured along the undersurface of the inferior right frontal lobe, image 20/5. There is associated mass effect upon the underlying cerebral cortex with effacement of the sulci. 2-3 mm of right to left midline shift noted. Para falcine subdural hematoma is also identified. This measures approximately 6 mm in thickness, image 26/3. Mild hemorrhagic contusion noted over the right inferior frontal gyrus, is suspected. The ventricles are normal in volume. No intraventricular hemorrhage identified. There is mild diffuse low-attenuation within the subcortical and periventricular white matter compatible with chronic microvascular disease. 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 Vascular: No hyperdense vessel or unexpected calcification. Skull: Normal. Negative for fracture or focal lesion. Other: None CT MAXILLOFACIAL FINDINGS Osseous: No fracture or mandibular dislocation. No destructive process. Orbits: Negative. No traumatic or inflammatory finding. Sinuses: Clear. Soft tissues: Negative. CT CERVICAL SPINE FINDINGS Alignment:  There is no signs of acute posttraumatic malalignment of the cervical spine. Anterolisthesis of C7 on T1 measures 3 mm and is likely related to chronic spondylosis. Skull base and vertebrae: No acute fracture. No primary bone lesion or focal pathologic process. Soft tissues and spinal canal: No prevertebral fluid or swelling. No visible canal hematoma. Disc levels: Marked multilevel disc space narrowing with endplate spurring is identified from C2-3 through C7-T1. Upper chest: There is pleural thickening along the medial aspect of the left apex which is only partially visualized but appears similar to 12/10/2021. On the scout radiograph signs of stent graft repair of thoracic aortic aneurysm noted. Other: None IMPRESSION: 1. Large acute right subdural hematoma overlying the inferior right frontal lobe and anterior right temporal lobe with associated mass effect upon the underlying cerebral cortex and 2-3 mm of right to left midline shift. 2. Para falcine subdural hematoma is also identified. 3. Mild hemorrhagic contusion over the right inferior frontal gyrus. 4. No evidence for facial bone fracture or dislocation. 5. No evidence for acute cervical spine fracture or subluxation. 6. 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 Critical Value/emergent results were called by telephone at the time of interpretation on 02/12/2022 at 6:53 am to provider [facility] , who verbally acknowledged these results. Electronically Signed   By: Queen Slough.D.  On: 02/12/2022 06:53   Disposition   Pt is being transferred to [facility] for further evaluation.  Follow-up Plans & Appointments     Follow-up Information     Schedule an appointment as soon as possible for a visit  with Georgeanna Harrison, MD.   Specialty: Orthopedic Surgery Contact information: [place] [address] [phone]                  Discharge Medications   Allergies  as of 02/12/2022       Reactions   Contrast Media [iodinated Contrast Media] Anaphylaxis   Iohexol Anaphylaxis, Other (See Comments)   PT HAS ANAPHYLAXIS WITH CONTRAST MEDIA!   Lipitor [atorvastatin Calcium] Anaphylaxis, Other (See Comments)   Large doses   Shellfish Allergy Anaphylaxis   Sulfa Antibiotics Shortness Of Breath, Swelling   Sulfonamide Derivatives Shortness Of Breath, Swelling   Almond Oil Itching   FACIAL/MOUTH ITCHING   Metrizamide Swelling   SWELLING REACTION UNSPECIFIED    Zocor [simvastatin] Other (See Comments)   Muscle cramps   Food Itching   Shellfish, peaches, almonds, apples & kiwis   Latex Rash, Other (See Comments)   With long periods of exposure   Peach [prunus Persica] Itching        Medication List     STOP taking these medications    alum & mag hydroxide-simeth 200-200-20 MG/5ML suspension Commonly known as: MAALOX/MYLANTA   aspirin EC 81 MG tablet   Klor-Con M20 20 MEQ tablet Generic drug: potassium chloride SA   methocarbamol 750 MG tablet Commonly known as: ROBAXIN   torsemide 20 MG tablet Commonly known as: DEMADEX   warfarin 2 MG tablet Commonly known as: COUMADIN       TAKE these medications    acetaminophen 325 MG tablet Commonly known as: TYLENOL Take 2 tablets (650 mg total) by mouth every 4 (four) hours as needed for headache or mild pain.   allopurinol 100 MG tablet Commonly known as: ZYLOPRIM Take 1 tablet (100 mg total) by mouth daily.   amLODipine 5 MG tablet Commonly known as: NORVASC Take 5 mg by mouth daily.   carvedilol 6.25 MG tablet Commonly known as: Coreg Take 1 tablet (6.25 mg total) by mouth 2 (two) times daily with a meal. If systolic is less than 90 you may hold Coreg until systolic is greater than 90, then resume taking   colchicine 0.6 MG tablet Take 1 tablet (0.6 mg total) by mouth daily.   Entresto 97-103 MG Generic drug: sacubitril-valsartan Take 1 tablet by mouth 2 (two) times  daily.   Farxiga 5 MG Tabs tablet Generic drug: dapagliflozin propanediol Take 5 mg by mouth every morning.   ferrous sulfate 325 (65 FE) MG tablet Take 325 mg by mouth daily with breakfast.   fluticasone 50 MCG/ACT nasal spray Commonly known as: FLONASE Place 2 sprays into both nostrils daily as needed for allergies or rhinitis.   gabapentin 300 MG capsule Commonly known as: Neurontin Take 1 capsule (300 mg total) by mouth at bedtime. What changed: when to take this   hydrALAZINE 100 MG tablet Commonly known as: APRESOLINE Take 100 mg by mouth 3 (three) times daily.   isosorbide mononitrate 30 MG 24 hr tablet Commonly known as: IMDUR Take 60 mg by mouth in the morning and at bedtime.   naloxone 4 MG/0.1ML Liqd nasal spray kit Commonly known as: NARCAN Place 1 spray into the nose as needed (accidental overdose).  nitroGLYCERIN 0.4 MG SL tablet Commonly known as: NITROSTAT DISSOLVE ONE TABLET UNDER THE TONGUE EVERY 5 MINUTES AS NEEDED FOR CHEST PAIN. MAY TAKE IF DIASTOLIC BP IS GREATER THAN 90 What changed: See the new instructions.   Oxycodone HCl 20 MG Tabs Take 20 mg by mouth every 6 (six) hours.   Praluent 75 MG/ML Soaj Generic drug: Alirocumab Inject 75 mg into the skin every 14 (fourteen) days.   spironolactone 25 MG tablet Commonly known as: ALDACTONE Take 25 mg by mouth daily.           Outstanding Labs/Studies   TBD at outside hospital  Duration of Discharge Encounter   Greater than 30 minutes including physician time.  Signed, Charlie Pitter, PA-C 02/12/2022, 2:20 PM

## 2022-02-12 NOTE — Progress Notes (Signed)
Orthopedic Tech Progress Note Patient Details:  Chad [name] [birth year] [id number]  Patient ID: [name], male   DOB: 08-16-1961, 61 y.o.   MRN: [id number] I went to apply the splint after an iv was taken out the patient started bleeding. After the bleeding was stopped the patient wanted to wait to have the splint  applied. Karolee Stamps 02/12/2022, 7:27 AM

## 2022-02-12 NOTE — ED Notes (Addendum)
Trauma Response Nurse Documentation   Chad [name]. is a 61 y.o. male arriving to [facility] ED via POV  On warfarin daily. Trauma was activated as a Level 2 by EDP Dykstra based on the following trauma criteria Discretion of Emergency Department Physician. Trauma team at the bedside on patient arrival. Patient cleared for CT by Dr. Roslynn Amble. Patient to CT with team. GCS 15.  History   Past Medical History:  Diagnosis Date   AICD (automatic cardioverter/defibrillator) present    Anemia    Anginal pain ([place])    Anxiety    Aortic dissection, thoracoabdominal ([place])    7/10: Type I s/p repair   Arthritis    Asthma    CAD (coronary artery disease)    a. s/p CABG 2006;  b. DES to PDA 2011 (cath: Dx not seen, dRCA/PDA tx with DES; S-PDA occluded (culprit), S-Dx occluded, S-RI and OM ok, L-LAD ok   Carotid stenosis    dopplers 2011: 0-39% bilat.   Chest pain syndrome    CHF (congestive heart failure) ([place])    06/14/21 LVAD placemnt [facility]   Chronic bronchitis (HCC)    Chronic lower back pain    Chronic systolic heart failure ([place])    a. 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; the other is hanging on" (11/07/2016)   Dyspnea    patient denies at 12/06/2018 appt    Family history of adverse reaction to anesthesia    "sister hard to wake up"   GERD (gastroesophageal reflux disease)    Gout    Heart murmur    HLD (hyperlipidemia)    HTN (hypertension)    severe   Myocardial infarction ([place])    "many" (11/07/2016)   PONV (postoperative nausea and vomiting)    Sleep apnea    does not use mask    Thyroid cancer ([place])    Hertle Cell     Past Surgical History:  Procedure Laterality Date   BACK SURGERY      CARDIAC CATHETERIZATION     "several" (11/07/2016)   CARDIAC DEFIBRILLATOR PLACEMENT  11/2005   [employer]; Archie Endo 05/09/2011   CHOLECYSTECTOMY N/A 12/09/2018   Procedure: LAPAROSCOPIC CHOLECYSTECTOMY;  Surgeon: Ralene Ok, MD;  Location: [facility];  Service: General;  Laterality: N/A;   CORONARY ANGIOPLASTY WITH STENT PLACEMENT     "I've had 1-2 stents" (11/07/2016)   CORONARY ARTERY BYPASS GRAFT  06/21/2009   "CABG X2"   CORONARY ARTERY BYPASS GRAFT  06/29/2005   "CABG X7"   ICD LEAD REMOVAL  11/07/2016   ICD LEAD REMOVAL N/A 11/07/2016   Procedure: ICD LEAD REMOVAL, INSERTION OF NEW ICD LEAD;  Surgeon: Evans Lance, MD;  Location: [place];  Service: Cardiovascular;  Laterality: N/A;  Dr. Prescott Gum to backup case   IMPLANTABLE CARDIOVERTER DEFIBRILLATOR (ICD) GENERATOR CHANGE N/A 12/13/2012   Procedure: ICD GENERATOR CHANGE;  Surgeon: Evans Lance, MD;  Location: [facility] CATH LAB;  Service: Cardiovascular;  Laterality: N/A;   INGUINAL HERNIA REPAIR Bilateral 06/25/2018   Procedure: LAPAROSCOPIC  BILATERAL INGUINAL HERNIA REPAIRS;  Surgeon: Ralene Ok, MD;  Location: [place];  Service: General;  Laterality: Bilateral;  INSERTION OF MESH Bilateral 06/25/2018   Procedure: INSERTION OF MESH;  Surgeon: Ralene Ok, MD;  Location: [place];  Service: General;  Laterality: Bilateral;   [place] SURGERY  01/2001    most recent within 5-10 years   RIGHT HEART CATH N/A 04/05/2018   Procedure: RIGHT HEART CATH;  Surgeon: Jolaine Artist, MD;  Location: [place] CV LAB;  Service: Cardiovascular;  Laterality: N/A;   RIGHT HEART CATH N/A 01/19/2020   Procedure: RIGHT HEART CATH;  Surgeon: Jolaine Artist, MD;  Location: [place] CV LAB;  Service: Cardiovascular;  Laterality: N/A;   SHOULDER ARTHROSCOPY WITH ROTATOR CUFF REPAIR Bilateral    Status post emergency repair of a type A ascending aortic dissection with a hemiarch reconstruction of the ascending aorta  using a 28-mm  Hemashield graft with redo sternotomy and revision of previous bypass grafts in June 2010.     TESTICLE SURGERY     THYROIDECTOMY, PARTIAL  06/20/2011   TONSILLECTOMY     UMBILICAL HERNIA REPAIR N/A 06/25/2018   Procedure: UMBILICAL HERNIA REPAIR;  Surgeon: Ralene Ok, MD;  Location: [place];  Service: General;  Laterality: N/A;   VASECTOMY         Initial Focused Assessment (If applicable, or please see trauma documentation): Laceration upper lip  CT's Completed:   CT Head, CT Maxillofacial, and CT C-Spine   Interventions:  Portable XRAY left shoulder, bilateral hands, left wrist, chest CTs as above IV start and trauma lab draw C-Collar COVID swab LVAD cart Laceration repair and wound care TDAP not indicated, updated 3/15  Plan for disposition:  Anticipate Admission to ICU  Consults completed:  Neurosurgeon at 0626. Cardiology at [place] notified [phone]  Event Summary: Patient arrives via POV after a fall resulting in laceration to upper lip, bilateral hand pain, bilateral wrist pain, left shoulder pain, neck and head pain. LVAD patient at [facility]. Doppler BP 038 systolic. Obvious subdural on initial pass head CT. Patient alertx4, appropriate, making jokes with nursing staff. Family at bedside.  LVAD nurse made aware of patient, requested that [facility] be made aware of patient at [phone]. Provided [facility] V[facility] pager number to EDP to consult. Dr. Algernon Huxley to admit patient if [facility] is not able to accept pt.    MTP Summary (If applicable): NA  Bedside handoff with ED RN [place].    Orson Rho O Adolph Clutter  Trauma Response RN  Please call TRN at [phone] for further assistance.

## 2022-02-12 NOTE — ED Notes (Addendum)
This NT removed IV where splint was being placed, pt began bleeding from IV site after bandage placed. This NT addressed bleeding and cleaned bleeding site, and bleeding is under control. Pt stated IV site was still bleeding and stated to ortho that pt does not want splint over IV site. This NT & ortho tech notified pt that IV site is not bleeding anymore.

## 2022-02-12 NOTE — Progress Notes (Signed)
°   02/12/22 0550  Clinical Encounter Type  Visited With Patient not available  Visit Type Initial  Referral From Nurse  Consult/Referral To None   Chaplain responded to a level two trauma alert. Medical team was providing care.  Family member remained with patient. I advised nurse is a request for chaplain was made to please contact [place].   Blain Resident  [facility] [phone]

## 2022-02-12 NOTE — ED Notes (Signed)
Patient transported to CT by this RN and SWAT nurse

## 2022-02-12 NOTE — Progress Notes (Signed)
Patient arrived to [facility] from ED at 1115. After report was received from ED RN, this RN called [facility] LVAD coordinator to ensure they were aware of patient being admitted. LVAD coordinator Tanda Rockers had been paged by ED early this AM when patient came in. LVAD coordinator had relayed to ED that this patient is followed by LVAD team at [facility] and that the ED physician needed to call [facility] to see if patient should be admitted here or transferred to [facility]. Dr Aundra Dubin admitted patient to [facility] with the understanding that [facility] LVAD team was okay with admitting patient here.   When patient arrived to [facility], ED RN reported that she was not sure if [facility] had been notified. ED RN and ED charge RN collaborated to call and notify [facility] LVAD coordinator Mickel Baas of patient being admitted at [facility] for SDH s/p fall. I also spoke with Mickel Baas, who said that they had not been notified this morning of patient's status or that he was being admitted at [facility]. [facility] LVAD team/MD spoke with Dr Aundra Dubin and requested transfer to [facility]. [facility] notified of need to transport patient to [facility]. This RN called report to RN on [employer] at [place]. Melina Copa PA assisted Dr Aundra Dubin with completing discharge summary and EMTALA paperwork. [facility] arrived and transferred patient at 1530. Patient's emergency VAD equipment sent with him, patient transported on batteries. Patient belongings sent with patient/[facility] and with patient's niece, [name].

## 2022-02-12 NOTE — Consult Note (Signed)
Neurosurgery Consultation  Reason for Consult: Subdural hematoma Referring Physician: Karle Starch  CC: Fall  HPI: This is a 61 y.o. man w/ LVAD on warfarin that presents after a fall with b/l wrist pain, facial pain, and headache with photophobia. Denies LOC, does have some nausea right now but no vomiting, but he has had a pretty severe lip lac that's been bleeding with at least some ingestion of heme products. No new weakness, numbness, or parasthesias, but pain-limited at both wrists. Obviously anticoagulated for his LVAD, on warfarin.    ROS: A 14 point ROS was performed and is negative except as noted in the HPI.   PMHx:  Past Medical History:  Diagnosis Date   AICD (automatic cardioverter/defibrillator) present    Anemia    Anginal pain ([place])    Anxiety    Aortic dissection, thoracoabdominal ([place])    7/10: Type I s/p repair   Arthritis    Asthma    CAD (coronary artery disease)    a. s/p CABG 2006;  b. DES to PDA 2011 (cath: Dx not seen, dRCA/PDA tx with DES; S-PDA occluded (culprit), S-Dx occluded, S-RI and OM ok, L-LAD ok   Carotid stenosis    dopplers 2011: 0-39% bilat.   Chest pain syndrome    CHF (congestive heart failure) ([place])    06/14/21 LVAD placemnt [facility]   Chronic bronchitis (HCC)    Chronic lower back pain    Chronic systolic heart failure ([place])    a. 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; the other is hanging on" (11/07/2016)   Dyspnea    patient denies at 12/06/2018 appt    Family history of adverse reaction to anesthesia    "sister hard to wake up"   GERD (gastroesophageal reflux disease)    Gout    Heart murmur    HLD (hyperlipidemia)    HTN (hypertension)    severe   Myocardial  infarction ([place])    "many" (11/07/2016)   PONV (postoperative nausea and vomiting)    Sleep apnea    does not use mask    Thyroid cancer ([place])    Hertle Cell   FamHx:  Family History  Problem Relation Age of Onset   Hypertension Father    Heart disease Father    Early death Father    COPD Father    Hypertension Mother    Coronary artery disease Other    SocHx:  reports that he has never smoked. He has never used smokeless tobacco. He reports that he does not drink alcohol and does not use drugs.  Exam: Vital signs in last 24 hours: Temp:  [97.7 F (36.5 C)] 97.7 F (36.5 C) (02/19 0619) Pulse Rate:  [79-81] 80 (02/19 0830) Resp:  [16-17] 16 (02/19 0830) BP: (125)/(103) 125/103 (02/19 0740) SpO2:  [95 %-100 %] 100 % (02/19 0830) Weight:  [85.8 kg] 85.8 kg (02/19 0555) General: Awake, alert, cooperative, lying in bed Head: Normocephalic, +facial swelling, +lip lac w/ dressing in place HEENT: Neck supple Pulmonary: breathing room air comfortably, no evidence of increased work of breathing Cardiac: LVAD in place with monitor attached Abdomen: S NT ND Extremities: Warm and well  perfused x4, severe pain w/ any motion of the bilateral wrists, no obvious deformity Neuro: AOx3, PERRL, EOMI, FS & SS Strength 5/5 x4, SILTx4 except pain limited at the bilateral wrists and hands - appears full strength there as well  Assessment and Plan: 61 y.o. man on warfarin for LVAD s/p fall. [place] personally reviewed, which shows acute R F subdural hematoma.   -discussed w/ the pt and his family at bedside, obviously a difficult situation with regard to keeping the LVAD patent and avoiding expansion of the hematoma, discussed with them that this is a serious issue and there's no easy / standard solution. Discussed with Dr. Aundra Dubin as well. I think that the safest plan with regard to both issues is to give vitamin K to start reversal, but avoid aggressive reversal w/ [facility].  -rpt CTH in 4 hours, if  hematoma expands will have to pursue active reversal -further plan regarding anticoagulation will have to be determined depending on repeat imaging / clinical course, but likely a few days off anticoagulation if possible -please call with any concerns or questions  Judith Part, MD 02/12/22 9:15 AM [facility]

## 2022-02-12 NOTE — Progress Notes (Signed)
Orthopedic Tech Progress Note Patient Details:  Chad [name] 1961-06-22 [id number]  Patient ID: Chad Bark., male   DOB: [birth year], 61 y.o.   MRN: [id number] I attended trauma page. Karolee Stamps 02/12/2022, 6:29 AM

## 2022-02-12 NOTE — Progress Notes (Signed)
Rpt CTH reviewed, it unfortunately shows expansion posteriorly, slightly wider but more AP extension, inc'd volume is causing more midline shift - now about 63mm. Pt re-examined, he's Ox3, Fcx4 with full strength except his pain-limitation in the bilateral wrists. +Headache but no N/V. Discussed w/ Dr. Aundra Dubin, I think it's a clear choice that we need to reverse him actively now, repeat the [facility] in 4h and follow clinically w/ q1h neuro checks in the meantime.  -will put in rpt [facility] for 15:00 (4h p prior) -reverse w/ [facility] -q1h neuro checks

## 2022-02-13 DIAGNOSIS — R739 Hyperglycemia, unspecified: Secondary | ICD-10-CM | POA: Diagnosis not present

## 2022-02-13 DIAGNOSIS — D72829 Elevated white blood cell count, unspecified: Secondary | ICD-10-CM | POA: Diagnosis not present

## 2022-02-13 DIAGNOSIS — M1812 Unilateral primary osteoarthritis of first carpometacarpal joint, left hand: Secondary | ICD-10-CM | POA: Diagnosis not present

## 2022-02-13 DIAGNOSIS — I358 Other nonrheumatic aortic valve disorders: Secondary | ICD-10-CM | POA: Diagnosis not present

## 2022-02-13 DIAGNOSIS — J952 Acute pulmonary insufficiency following nonthoracic surgery: Secondary | ICD-10-CM | POA: Diagnosis not present

## 2022-02-13 DIAGNOSIS — S62112A Displaced fracture of triquetrum [cuneiform] bone, left wrist, initial encounter for closed fracture: Secondary | ICD-10-CM | POA: Diagnosis not present

## 2022-02-13 DIAGNOSIS — W1839XA Other fall on same level, initial encounter: Secondary | ICD-10-CM | POA: Diagnosis not present

## 2022-02-13 DIAGNOSIS — D500 Iron deficiency anemia secondary to blood loss (chronic): Secondary | ICD-10-CM | POA: Diagnosis not present

## 2022-02-13 DIAGNOSIS — Z95811 Presence of heart assist device: Secondary | ICD-10-CM | POA: Diagnosis not present

## 2022-02-14 DIAGNOSIS — D72829 Elevated white blood cell count, unspecified: Secondary | ICD-10-CM | POA: Diagnosis not present

## 2022-02-14 DIAGNOSIS — I6200 Nontraumatic subdural hemorrhage, unspecified: Secondary | ICD-10-CM | POA: Diagnosis not present

## 2022-02-14 DIAGNOSIS — I358 Other nonrheumatic aortic valve disorders: Secondary | ICD-10-CM | POA: Diagnosis not present

## 2022-02-14 DIAGNOSIS — R739 Hyperglycemia, unspecified: Secondary | ICD-10-CM | POA: Diagnosis not present

## 2022-02-14 DIAGNOSIS — S065XAA Traumatic subdural hemorrhage with loss of consciousness status unknown, initial encounter: Secondary | ICD-10-CM | POA: Diagnosis not present

## 2022-02-14 DIAGNOSIS — D500 Iron deficiency anemia secondary to blood loss (chronic): Secondary | ICD-10-CM | POA: Diagnosis not present

## 2022-02-14 DIAGNOSIS — J952 Acute pulmonary insufficiency following nonthoracic surgery: Secondary | ICD-10-CM | POA: Diagnosis not present

## 2022-02-15 DIAGNOSIS — I358 Other nonrheumatic aortic valve disorders: Secondary | ICD-10-CM | POA: Diagnosis not present

## 2022-02-16 DIAGNOSIS — Z4502 Encounter for adjustment and management of automatic implantable cardiac defibrillator: Secondary | ICD-10-CM | POA: Diagnosis not present

## 2022-02-16 DIAGNOSIS — G959 Disease of spinal cord, unspecified: Secondary | ICD-10-CM | POA: Diagnosis not present

## 2022-02-16 DIAGNOSIS — I358 Other nonrheumatic aortic valve disorders: Secondary | ICD-10-CM | POA: Diagnosis not present

## 2022-02-16 DIAGNOSIS — Z9889 Other specified postprocedural states: Secondary | ICD-10-CM | POA: Diagnosis not present

## 2022-02-16 DIAGNOSIS — Z8679 Personal history of other diseases of the circulatory system: Secondary | ICD-10-CM | POA: Diagnosis not present

## 2022-02-17 DIAGNOSIS — I358 Other nonrheumatic aortic valve disorders: Secondary | ICD-10-CM | POA: Diagnosis not present

## 2022-02-20 ENCOUNTER — Telehealth: Payer: Self-pay

## 2022-02-20 DIAGNOSIS — Z789 Other specified health status: Secondary | ICD-10-CM

## 2022-02-20 NOTE — Telephone Encounter (Signed)
Transition Care Management Unsuccessful Follow-up Telephone Call  Date of discharge and from where:  02/17/2022 from [facility]  Attempts:  1st Attempt  Reason for unsuccessful TCM follow-up call:  Left voice message

## 2022-02-21 DIAGNOSIS — M542 Cervicalgia: Secondary | ICD-10-CM | POA: Diagnosis not present

## 2022-02-21 DIAGNOSIS — M25552 Pain in left hip: Secondary | ICD-10-CM | POA: Diagnosis not present

## 2022-02-21 DIAGNOSIS — M5431 Sciatica, right side: Secondary | ICD-10-CM | POA: Diagnosis not present

## 2022-02-21 DIAGNOSIS — G894 Chronic pain syndrome: Secondary | ICD-10-CM | POA: Diagnosis not present

## 2022-02-21 DIAGNOSIS — G890 Central pain syndrome: Secondary | ICD-10-CM | POA: Diagnosis not present

## 2022-02-21 DIAGNOSIS — M25551 Pain in right hip: Secondary | ICD-10-CM | POA: Diagnosis not present

## 2022-02-21 DIAGNOSIS — M5442 Lumbago with sciatica, left side: Secondary | ICD-10-CM | POA: Diagnosis not present

## 2022-02-21 DIAGNOSIS — M79661 Pain in right lower leg: Secondary | ICD-10-CM | POA: Diagnosis not present

## 2022-02-21 DIAGNOSIS — M79662 Pain in left lower leg: Secondary | ICD-10-CM | POA: Diagnosis not present

## 2022-02-21 NOTE — Telephone Encounter (Signed)
Transition Care Management Follow-up Telephone Call Date of discharge and from where: 02/17/2022 from [place] How have you been since you were released from the hospital? Patient stated that he is not feeling well at all. Patient stated that he was at an appointment and needed to get off the phone. Patient is requesting some in home help as his family members are working during the day and does not have assistance at that time.  Any questions or concerns? Yes  Items Reviewed: Did the pt receive and understand the discharge instructions provided? Yes  Medications obtained and verified? Yes  Other? No  Any new allergies since your discharge? No  Dietary orders reviewed? No Do you have support at home? Yes  Only in the evenings.   Functional Questionnaire: (I = Independent and D = Dependent) ADLs: I but needs assistance  Bathing/Dressing- I but needs assistance  Meal Prep- D  Eating- I  Maintaining continence- I  Transferring/Ambulation- I but needs assistance  Managing Meds- I but needs assistance   Follow up appointments reviewed:  PCP Hospital f/u appt confirmed? No   Specialist Hospital f/u appt confirmed? Yes  Scheduled to see [facility] on 02/22/2022 @ 10:30am. Are transportation arrangements needed? No  If their condition worsens, is the pt aware to call PCP or go to the Emergency Dept.? Yes Was the patient provided with contact information for the PCP's office or ED? Yes Was to pt encouraged to call back with questions or concerns? Yes

## 2022-02-22 ENCOUNTER — Other Ambulatory Visit: Payer: Self-pay

## 2022-02-22 DIAGNOSIS — Z419 Encounter for procedure for purposes other than remedying health state, unspecified: Secondary | ICD-10-CM | POA: Diagnosis not present

## 2022-02-22 DIAGNOSIS — I6200 Nontraumatic subdural hemorrhage, unspecified: Secondary | ICD-10-CM | POA: Diagnosis not present

## 2022-02-22 DIAGNOSIS — S065XAA Traumatic subdural hemorrhage with loss of consciousness status unknown, initial encounter: Secondary | ICD-10-CM | POA: Diagnosis not present

## 2022-02-22 NOTE — Patient Outreach (Signed)
Care Coordination ? ?02/22/2022 ? ?Chad [name]. ?March 29, 1961 ?[id number] ? ?02/22/2022 ?Name: Chad [name]. MRN: [id number] DOB: 1961/11/23 ? ?Referred by: Jolaine Artist, MD ?Reason for referral : High Risk Managed Medicaid (RNCM Telephone Outreach for High Risk Managed Medicaid Care Management - Patient Declined Enrollment) ? ? ?This RN Care Manager placed a successful telephone outreach to the patient.  The [place] Medicaid program offered to the patient through his [employer] Managed Medicaid insurance was explained.  The patient declined enrollment at this time.   ? ?Please let me know if the [place] Medicaid team can be of any assistance in the future. ? ?Salvatore Marvel RN, BSN ?Community Care Coordinator ?[place] [facility] ?Mobile: [phone]  ?

## 2022-02-22 NOTE — Patient Instructions (Signed)
[place] [facility] Record. ,  ? ?The [facility] Managed Care Team is available to provide assistance to you with your healthcare needs at no cost and as a benefit of your [facility] Health plan.  ? ?It is my understanding that you have declined enrollment in the [place] Medicaid program. ? ?The care management team is available to assist with your healthcare needs at any time. Please do not hesitate to contact me at the number below. .  ? ?Thank you,  ? ?Salvatore Marvel RN, BSN ?Community Care Coordinator ?[place] [facility] ?Mobile: [phone]   ? ? ? ? ? ? ? ? ? ?  ?  ?

## 2022-02-27 DIAGNOSIS — I255 Ischemic cardiomyopathy: Secondary | ICD-10-CM | POA: Diagnosis not present

## 2022-02-27 DIAGNOSIS — Z95811 Presence of heart assist device: Secondary | ICD-10-CM | POA: Diagnosis not present

## 2022-02-27 DIAGNOSIS — D696 Thrombocytopenia, unspecified: Secondary | ICD-10-CM | POA: Diagnosis not present

## 2022-02-27 DIAGNOSIS — W19XXXD Unspecified fall, subsequent encounter: Secondary | ICD-10-CM | POA: Diagnosis not present

## 2022-02-27 DIAGNOSIS — R9431 Abnormal electrocardiogram [ECG] [EKG]: Secondary | ICD-10-CM | POA: Diagnosis not present

## 2022-02-27 DIAGNOSIS — Z953 Presence of xenogenic heart valve: Secondary | ICD-10-CM | POA: Diagnosis not present

## 2022-02-27 DIAGNOSIS — N170 Acute kidney failure with tubular necrosis: Secondary | ICD-10-CM | POA: Diagnosis not present

## 2022-02-27 DIAGNOSIS — I2510 Atherosclerotic heart disease of native coronary artery without angina pectoris: Secondary | ICD-10-CM | POA: Diagnosis not present

## 2022-02-27 DIAGNOSIS — I5022 Chronic systolic (congestive) heart failure: Secondary | ICD-10-CM | POA: Diagnosis not present

## 2022-02-27 DIAGNOSIS — I130 Hypertensive heart and chronic kidney disease with heart failure and stage 1 through stage 4 chronic kidney disease, or unspecified chronic kidney disease: Secondary | ICD-10-CM | POA: Diagnosis not present

## 2022-02-27 DIAGNOSIS — R519 Headache, unspecified: Secondary | ICD-10-CM | POA: Diagnosis not present

## 2022-02-27 DIAGNOSIS — G9389 Other specified disorders of brain: Secondary | ICD-10-CM | POA: Diagnosis not present

## 2022-02-27 DIAGNOSIS — I482 Chronic atrial fibrillation, unspecified: Secondary | ICD-10-CM | POA: Diagnosis not present

## 2022-02-27 DIAGNOSIS — I6201 Nontraumatic acute subdural hemorrhage: Secondary | ICD-10-CM | POA: Diagnosis not present

## 2022-02-27 DIAGNOSIS — S065X0D Traumatic subdural hemorrhage without loss of consciousness, subsequent encounter: Secondary | ICD-10-CM | POA: Diagnosis not present

## 2022-02-27 DIAGNOSIS — Z20822 Contact with and (suspected) exposure to covid-19: Secondary | ICD-10-CM | POA: Diagnosis not present

## 2022-02-27 DIAGNOSIS — G936 Cerebral edema: Secondary | ICD-10-CM | POA: Diagnosis not present

## 2022-02-27 DIAGNOSIS — J45909 Unspecified asthma, uncomplicated: Secondary | ICD-10-CM | POA: Diagnosis not present

## 2022-02-27 DIAGNOSIS — S065X0A Traumatic subdural hemorrhage without loss of consciousness, initial encounter: Secondary | ICD-10-CM | POA: Diagnosis not present

## 2022-02-27 DIAGNOSIS — E871 Hypo-osmolality and hyponatremia: Secondary | ICD-10-CM | POA: Diagnosis not present

## 2022-02-28 DIAGNOSIS — I255 Ischemic cardiomyopathy: Secondary | ICD-10-CM | POA: Diagnosis not present

## 2022-02-28 DIAGNOSIS — R519 Headache, unspecified: Secondary | ICD-10-CM | POA: Diagnosis not present

## 2022-02-28 DIAGNOSIS — I482 Chronic atrial fibrillation, unspecified: Secondary | ICD-10-CM | POA: Diagnosis not present

## 2022-02-28 DIAGNOSIS — Z95811 Presence of heart assist device: Secondary | ICD-10-CM | POA: Diagnosis not present

## 2022-02-28 DIAGNOSIS — I6200 Nontraumatic subdural hemorrhage, unspecified: Secondary | ICD-10-CM | POA: Diagnosis not present

## 2022-02-28 DIAGNOSIS — Z9889 Other specified postprocedural states: Secondary | ICD-10-CM | POA: Diagnosis not present

## 2022-02-28 DIAGNOSIS — Z7901 Long term (current) use of anticoagulants: Secondary | ICD-10-CM | POA: Diagnosis not present

## 2022-02-28 DIAGNOSIS — N170 Acute kidney failure with tubular necrosis: Secondary | ICD-10-CM | POA: Diagnosis not present

## 2022-03-01 DIAGNOSIS — Z95811 Presence of heart assist device: Secondary | ICD-10-CM | POA: Diagnosis not present

## 2022-03-01 DIAGNOSIS — R519 Headache, unspecified: Secondary | ICD-10-CM | POA: Diagnosis not present

## 2022-03-01 DIAGNOSIS — S062X0A Diffuse traumatic brain injury without loss of consciousness, initial encounter: Secondary | ICD-10-CM | POA: Diagnosis not present

## 2022-03-01 DIAGNOSIS — S065X0A Traumatic subdural hemorrhage without loss of consciousness, initial encounter: Secondary | ICD-10-CM | POA: Diagnosis not present

## 2022-03-01 DIAGNOSIS — N170 Acute kidney failure with tubular necrosis: Secondary | ICD-10-CM | POA: Diagnosis not present

## 2022-03-01 DIAGNOSIS — W208XXA Other cause of strike by thrown, projected or falling object, initial encounter: Secondary | ICD-10-CM | POA: Diagnosis not present

## 2022-03-01 DIAGNOSIS — Z9889 Other specified postprocedural states: Secondary | ICD-10-CM | POA: Diagnosis not present

## 2022-03-01 DIAGNOSIS — I6200 Nontraumatic subdural hemorrhage, unspecified: Secondary | ICD-10-CM | POA: Diagnosis not present

## 2022-03-01 DIAGNOSIS — Z7901 Long term (current) use of anticoagulants: Secondary | ICD-10-CM | POA: Diagnosis not present

## 2022-03-01 DIAGNOSIS — I482 Chronic atrial fibrillation, unspecified: Secondary | ICD-10-CM | POA: Diagnosis not present

## 2022-03-01 DIAGNOSIS — I255 Ischemic cardiomyopathy: Secondary | ICD-10-CM | POA: Diagnosis not present

## 2022-03-02 DIAGNOSIS — R519 Headache, unspecified: Secondary | ICD-10-CM | POA: Diagnosis not present

## 2022-03-02 DIAGNOSIS — I255 Ischemic cardiomyopathy: Secondary | ICD-10-CM | POA: Diagnosis not present

## 2022-03-02 DIAGNOSIS — I6200 Nontraumatic subdural hemorrhage, unspecified: Secondary | ICD-10-CM | POA: Diagnosis not present

## 2022-03-02 DIAGNOSIS — Z7901 Long term (current) use of anticoagulants: Secondary | ICD-10-CM | POA: Diagnosis not present

## 2022-03-02 DIAGNOSIS — N170 Acute kidney failure with tubular necrosis: Secondary | ICD-10-CM | POA: Diagnosis not present

## 2022-03-02 DIAGNOSIS — I482 Chronic atrial fibrillation, unspecified: Secondary | ICD-10-CM | POA: Diagnosis not present

## 2022-03-02 DIAGNOSIS — S065X0A Traumatic subdural hemorrhage without loss of consciousness, initial encounter: Secondary | ICD-10-CM | POA: Diagnosis not present

## 2022-03-02 DIAGNOSIS — Z95811 Presence of heart assist device: Secondary | ICD-10-CM | POA: Diagnosis not present

## 2022-03-02 DIAGNOSIS — Z9889 Other specified postprocedural states: Secondary | ICD-10-CM | POA: Diagnosis not present

## 2022-03-03 DIAGNOSIS — R519 Headache, unspecified: Secondary | ICD-10-CM | POA: Diagnosis not present

## 2022-03-03 DIAGNOSIS — Z9889 Other specified postprocedural states: Secondary | ICD-10-CM | POA: Diagnosis not present

## 2022-03-03 DIAGNOSIS — N170 Acute kidney failure with tubular necrosis: Secondary | ICD-10-CM | POA: Diagnosis not present

## 2022-03-03 DIAGNOSIS — Z95811 Presence of heart assist device: Secondary | ICD-10-CM | POA: Diagnosis not present

## 2022-03-03 DIAGNOSIS — I255 Ischemic cardiomyopathy: Secondary | ICD-10-CM | POA: Diagnosis not present

## 2022-03-03 DIAGNOSIS — I6200 Nontraumatic subdural hemorrhage, unspecified: Secondary | ICD-10-CM | POA: Diagnosis not present

## 2022-03-03 DIAGNOSIS — I6203 Nontraumatic chronic subdural hemorrhage: Secondary | ICD-10-CM | POA: Diagnosis not present

## 2022-03-03 DIAGNOSIS — I6201 Nontraumatic acute subdural hemorrhage: Secondary | ICD-10-CM | POA: Diagnosis not present

## 2022-03-03 DIAGNOSIS — I482 Chronic atrial fibrillation, unspecified: Secondary | ICD-10-CM | POA: Diagnosis not present

## 2022-03-03 DIAGNOSIS — Z7901 Long term (current) use of anticoagulants: Secondary | ICD-10-CM | POA: Diagnosis not present

## 2022-03-04 DIAGNOSIS — S065X0A Traumatic subdural hemorrhage without loss of consciousness, initial encounter: Secondary | ICD-10-CM | POA: Diagnosis not present

## 2022-03-04 DIAGNOSIS — N182 Chronic kidney disease, stage 2 (mild): Secondary | ICD-10-CM | POA: Diagnosis not present

## 2022-03-04 DIAGNOSIS — I255 Ischemic cardiomyopathy: Secondary | ICD-10-CM | POA: Diagnosis not present

## 2022-03-04 DIAGNOSIS — E871 Hypo-osmolality and hyponatremia: Secondary | ICD-10-CM | POA: Diagnosis not present

## 2022-03-05 DIAGNOSIS — S065X0A Traumatic subdural hemorrhage without loss of consciousness, initial encounter: Secondary | ICD-10-CM | POA: Diagnosis not present

## 2022-03-05 DIAGNOSIS — N182 Chronic kidney disease, stage 2 (mild): Secondary | ICD-10-CM | POA: Diagnosis not present

## 2022-03-05 DIAGNOSIS — E871 Hypo-osmolality and hyponatremia: Secondary | ICD-10-CM | POA: Diagnosis not present

## 2022-03-05 DIAGNOSIS — I255 Ischemic cardiomyopathy: Secondary | ICD-10-CM | POA: Diagnosis not present

## 2022-03-06 DIAGNOSIS — S065X0A Traumatic subdural hemorrhage without loss of consciousness, initial encounter: Secondary | ICD-10-CM | POA: Diagnosis not present

## 2022-03-06 DIAGNOSIS — N182 Chronic kidney disease, stage 2 (mild): Secondary | ICD-10-CM | POA: Diagnosis not present

## 2022-03-06 DIAGNOSIS — I255 Ischemic cardiomyopathy: Secondary | ICD-10-CM | POA: Diagnosis not present

## 2022-03-06 DIAGNOSIS — E871 Hypo-osmolality and hyponatremia: Secondary | ICD-10-CM | POA: Diagnosis not present

## 2022-03-07 DIAGNOSIS — I255 Ischemic cardiomyopathy: Secondary | ICD-10-CM | POA: Diagnosis not present

## 2022-03-07 DIAGNOSIS — N182 Chronic kidney disease, stage 2 (mild): Secondary | ICD-10-CM | POA: Diagnosis not present

## 2022-03-07 DIAGNOSIS — S065X0A Traumatic subdural hemorrhage without loss of consciousness, initial encounter: Secondary | ICD-10-CM | POA: Diagnosis not present

## 2022-03-07 DIAGNOSIS — E871 Hypo-osmolality and hyponatremia: Secondary | ICD-10-CM | POA: Diagnosis not present

## 2022-03-08 DIAGNOSIS — I130 Hypertensive heart and chronic kidney disease with heart failure and stage 1 through stage 4 chronic kidney disease, or unspecified chronic kidney disease: Secondary | ICD-10-CM | POA: Diagnosis not present

## 2022-03-08 DIAGNOSIS — N182 Chronic kidney disease, stage 2 (mild): Secondary | ICD-10-CM | POA: Diagnosis not present

## 2022-03-08 DIAGNOSIS — N179 Acute kidney failure, unspecified: Secondary | ICD-10-CM | POA: Diagnosis not present

## 2022-03-08 DIAGNOSIS — I5030 Unspecified diastolic (congestive) heart failure: Secondary | ICD-10-CM | POA: Diagnosis not present

## 2022-03-09 DIAGNOSIS — R519 Headache, unspecified: Secondary | ICD-10-CM | POA: Diagnosis not present

## 2022-03-09 DIAGNOSIS — I6201 Nontraumatic acute subdural hemorrhage: Secondary | ICD-10-CM | POA: Diagnosis not present

## 2022-03-09 DIAGNOSIS — I130 Hypertensive heart and chronic kidney disease with heart failure and stage 1 through stage 4 chronic kidney disease, or unspecified chronic kidney disease: Secondary | ICD-10-CM | POA: Diagnosis not present

## 2022-03-09 DIAGNOSIS — I5020 Unspecified systolic (congestive) heart failure: Secondary | ICD-10-CM | POA: Diagnosis not present

## 2022-03-10 DIAGNOSIS — I6200 Nontraumatic subdural hemorrhage, unspecified: Secondary | ICD-10-CM | POA: Diagnosis not present

## 2022-03-10 DIAGNOSIS — I6201 Nontraumatic acute subdural hemorrhage: Secondary | ICD-10-CM | POA: Diagnosis not present

## 2022-03-10 DIAGNOSIS — I130 Hypertensive heart and chronic kidney disease with heart failure and stage 1 through stage 4 chronic kidney disease, or unspecified chronic kidney disease: Secondary | ICD-10-CM | POA: Diagnosis not present

## 2022-03-10 DIAGNOSIS — I639 Cerebral infarction, unspecified: Secondary | ICD-10-CM | POA: Diagnosis not present

## 2022-03-10 DIAGNOSIS — R519 Headache, unspecified: Secondary | ICD-10-CM | POA: Diagnosis not present

## 2022-03-10 DIAGNOSIS — I5020 Unspecified systolic (congestive) heart failure: Secondary | ICD-10-CM | POA: Diagnosis not present

## 2022-03-13 DIAGNOSIS — H5711 Ocular pain, right eye: Secondary | ICD-10-CM | POA: Diagnosis not present

## 2022-03-13 DIAGNOSIS — Z9889 Other specified postprocedural states: Secondary | ICD-10-CM | POA: Diagnosis not present

## 2022-03-13 DIAGNOSIS — Z5181 Encounter for therapeutic drug level monitoring: Secondary | ICD-10-CM | POA: Diagnosis not present

## 2022-03-13 DIAGNOSIS — I6200 Nontraumatic subdural hemorrhage, unspecified: Secondary | ICD-10-CM | POA: Diagnosis not present

## 2022-03-13 DIAGNOSIS — Z95811 Presence of heart assist device: Secondary | ICD-10-CM | POA: Diagnosis not present

## 2022-03-13 DIAGNOSIS — R519 Headache, unspecified: Secondary | ICD-10-CM | POA: Diagnosis not present

## 2022-03-13 DIAGNOSIS — Z7901 Long term (current) use of anticoagulants: Secondary | ICD-10-CM | POA: Diagnosis not present

## 2022-03-13 DIAGNOSIS — R600 Localized edema: Secondary | ICD-10-CM | POA: Diagnosis not present

## 2022-03-13 DIAGNOSIS — Z79899 Other long term (current) drug therapy: Secondary | ICD-10-CM | POA: Diagnosis not present

## 2022-03-14 DIAGNOSIS — R600 Localized edema: Secondary | ICD-10-CM | POA: Diagnosis not present

## 2022-03-14 DIAGNOSIS — R519 Headache, unspecified: Secondary | ICD-10-CM | POA: Diagnosis not present

## 2022-03-14 DIAGNOSIS — R9431 Abnormal electrocardiogram [ECG] [EKG]: Secondary | ICD-10-CM | POA: Diagnosis not present

## 2022-03-14 DIAGNOSIS — I6200 Nontraumatic subdural hemorrhage, unspecified: Secondary | ICD-10-CM | POA: Diagnosis not present

## 2022-03-15 ENCOUNTER — Telehealth: Payer: Self-pay

## 2022-03-15 NOTE — Telephone Encounter (Signed)
Transition Care Management Unsuccessful Follow-up Telephone Call ? ?Date of discharge and from where:  03/14/2022 from [facility] ? ?Attempts:  1st Attempt ? ?Reason for unsuccessful TCM follow-up call:  Left voice message ? ? ? ?

## 2022-03-16 NOTE — Telephone Encounter (Signed)
Transition Care Management Follow-up Telephone Call ?Date of discharge and from where: 03/14/2022 from [facility] ?How have you been since you were released from the hospital? Patient stated that he is feeling better and did not have any questions or concerns.  ?Any questions or concerns? No ? ?Items Reviewed: ?Did the pt receive and understand the discharge instructions provided? Yes  ?Medications obtained and verified? Yes  ?Other? No  ?Any new allergies since your discharge? No  ?Dietary orders reviewed? No ?Do you have support at home? Yes  ? ?Functional Questionnaire: (I = Independent and D = Dependent) ?ADLs: I ? ?Bathing/Dressing- I ? ?Meal Prep- I ? ?Eating- I ? ?Maintaining continence- I ? ?Transferring/Ambulation- I ? ?Managing Meds- I ? ? ?Follow up appointments reviewed: ? ?PCP Hospital f/u appt confirmed? No   ?Specialist Hospital f/u appt confirmed? Yes  Scheduled to see [facility] on 03/16/2022 @ 1pm. ?Are transportation arrangements needed? No  ?If their condition worsens, is the pt aware to call PCP or go to the Emergency Dept.? Yes ?Was the patient provided with contact information for the PCP's office or ED? Yes ?Was to pt encouraged to call back with questions or concerns? Yes ? ?

## 2022-03-17 DIAGNOSIS — N182 Chronic kidney disease, stage 2 (mild): Secondary | ICD-10-CM | POA: Diagnosis not present

## 2022-03-17 DIAGNOSIS — Z7901 Long term (current) use of anticoagulants: Secondary | ICD-10-CM | POA: Diagnosis not present

## 2022-03-17 DIAGNOSIS — Z95811 Presence of heart assist device: Secondary | ICD-10-CM | POA: Diagnosis not present

## 2022-03-21 DIAGNOSIS — M5137 Other intervertebral disc degeneration, lumbosacral region: Secondary | ICD-10-CM | POA: Diagnosis not present

## 2022-03-21 DIAGNOSIS — G890 Central pain syndrome: Secondary | ICD-10-CM | POA: Diagnosis not present

## 2022-03-21 DIAGNOSIS — M79662 Pain in left lower leg: Secondary | ICD-10-CM | POA: Diagnosis not present

## 2022-03-21 DIAGNOSIS — M79661 Pain in right lower leg: Secondary | ICD-10-CM | POA: Diagnosis not present

## 2022-03-21 DIAGNOSIS — G894 Chronic pain syndrome: Secondary | ICD-10-CM | POA: Diagnosis not present

## 2022-03-21 DIAGNOSIS — M545 Low back pain, unspecified: Secondary | ICD-10-CM | POA: Diagnosis not present

## 2022-03-21 DIAGNOSIS — M5431 Sciatica, right side: Secondary | ICD-10-CM | POA: Diagnosis not present

## 2022-03-21 DIAGNOSIS — M25552 Pain in left hip: Secondary | ICD-10-CM | POA: Diagnosis not present

## 2022-03-21 DIAGNOSIS — M25551 Pain in right hip: Secondary | ICD-10-CM | POA: Diagnosis not present

## 2022-03-21 DIAGNOSIS — Z7901 Long term (current) use of anticoagulants: Secondary | ICD-10-CM | POA: Diagnosis not present

## 2022-03-21 DIAGNOSIS — Z95811 Presence of heart assist device: Secondary | ICD-10-CM | POA: Diagnosis not present

## 2022-03-24 DIAGNOSIS — Z95811 Presence of heart assist device: Secondary | ICD-10-CM | POA: Diagnosis not present

## 2022-03-24 DIAGNOSIS — Z7901 Long term (current) use of anticoagulants: Secondary | ICD-10-CM | POA: Diagnosis not present

## 2022-03-25 DIAGNOSIS — Z419 Encounter for procedure for purposes other than remedying health state, unspecified: Secondary | ICD-10-CM | POA: Diagnosis not present

## 2022-03-27 DIAGNOSIS — I5084 End stage heart failure: Secondary | ICD-10-CM | POA: Diagnosis not present

## 2022-03-27 DIAGNOSIS — R9431 Abnormal electrocardiogram [ECG] [EKG]: Secondary | ICD-10-CM | POA: Diagnosis not present

## 2022-03-27 DIAGNOSIS — I6200 Nontraumatic subdural hemorrhage, unspecified: Secondary | ICD-10-CM | POA: Diagnosis not present

## 2022-03-27 DIAGNOSIS — R519 Headache, unspecified: Secondary | ICD-10-CM | POA: Diagnosis not present

## 2022-03-27 DIAGNOSIS — N183 Chronic kidney disease, stage 3 unspecified: Secondary | ICD-10-CM | POA: Diagnosis not present

## 2022-03-27 DIAGNOSIS — I5022 Chronic systolic (congestive) heart failure: Secondary | ICD-10-CM | POA: Diagnosis not present

## 2022-03-27 DIAGNOSIS — S065X0A Traumatic subdural hemorrhage without loss of consciousness, initial encounter: Secondary | ICD-10-CM | POA: Diagnosis not present

## 2022-03-27 DIAGNOSIS — R5383 Other fatigue: Secondary | ICD-10-CM | POA: Diagnosis not present

## 2022-03-27 DIAGNOSIS — H5711 Ocular pain, right eye: Secondary | ICD-10-CM | POA: Diagnosis not present

## 2022-03-27 DIAGNOSIS — R918 Other nonspecific abnormal finding of lung field: Secondary | ICD-10-CM | POA: Diagnosis not present

## 2022-03-27 DIAGNOSIS — M109 Gout, unspecified: Secondary | ICD-10-CM | POA: Diagnosis not present

## 2022-03-27 DIAGNOSIS — I444 Left anterior fascicular block: Secondary | ICD-10-CM | POA: Diagnosis not present

## 2022-03-27 DIAGNOSIS — R079 Chest pain, unspecified: Secondary | ICD-10-CM | POA: Diagnosis not present

## 2022-03-27 DIAGNOSIS — R0789 Other chest pain: Secondary | ICD-10-CM | POA: Diagnosis not present

## 2022-03-27 DIAGNOSIS — Z95811 Presence of heart assist device: Secondary | ICD-10-CM | POA: Diagnosis not present

## 2022-03-27 DIAGNOSIS — I517 Cardiomegaly: Secondary | ICD-10-CM | POA: Diagnosis not present

## 2022-03-28 DIAGNOSIS — S065X0A Traumatic subdural hemorrhage without loss of consciousness, initial encounter: Secondary | ICD-10-CM | POA: Diagnosis not present

## 2022-03-28 DIAGNOSIS — R079 Chest pain, unspecified: Secondary | ICD-10-CM | POA: Diagnosis not present

## 2022-03-31 ENCOUNTER — Telehealth (HOSPITAL_COMMUNITY): Payer: Self-pay | Admitting: *Deleted

## 2022-03-31 NOTE — Telephone Encounter (Signed)
Called patient regarding upcoming shared care visit scheduled with Dr. Haroldine Laws next week on Tuesday, 04/04/22. Patient has had the following admissions to [facility]: ? 2/19 - 02/17/22 - transferred from [facility] for fall with SDH and fx wrist ? 3/6 - 03/10/22 - admitted to [facility] after ED visit with worsening HA. CT revealed increased SDH ?  ?He has also had several ED visits at [place]: ? 03/13/22 - right eye pain  ? 03/27/22 - chest pain ? ?Spoke with patient, he has hospital d/c appt with [facility] cardiology 04/18/22. He also has multiple upcoming appts with Neurology, Radiology, Oncology, and PT/OT.  ? ?Shared care appointment with [facility] re-scheduled to 06/06/22 at 10:00 am. Pt verbalized understanding of same.  ? ?Zada Girt RN, VAD Coordinator ?[phone] ?

## 2022-04-03 DIAGNOSIS — Z95811 Presence of heart assist device: Secondary | ICD-10-CM | POA: Diagnosis not present

## 2022-04-03 DIAGNOSIS — Z7901 Long term (current) use of anticoagulants: Secondary | ICD-10-CM | POA: Diagnosis not present

## 2022-04-04 ENCOUNTER — Encounter (HOSPITAL_COMMUNITY): Payer: Medicaid Other

## 2022-04-11 DIAGNOSIS — Z95811 Presence of heart assist device: Secondary | ICD-10-CM | POA: Diagnosis not present

## 2022-04-11 DIAGNOSIS — Z7901 Long term (current) use of anticoagulants: Secondary | ICD-10-CM | POA: Diagnosis not present

## 2022-04-14 ENCOUNTER — Emergency Department (HOSPITAL_COMMUNITY)
Admission: EM | Admit: 2022-04-14 | Discharge: 2022-04-14 | Disposition: A | Discharge status: Home / Self Care | Payer: Medicaid Other | Attending: Emergency Medicine | Admitting: Emergency Medicine

## 2022-04-14 ENCOUNTER — Ambulatory Visit (INDEPENDENT_AMBULATORY_CARE_PROVIDER_SITE_OTHER): Payer: Medicaid Other

## 2022-04-14 ENCOUNTER — Emergency Department (HOSPITAL_COMMUNITY): Payer: Medicaid Other

## 2022-04-14 DIAGNOSIS — I499 Cardiac arrhythmia, unspecified: Secondary | ICD-10-CM | POA: Diagnosis not present

## 2022-04-14 DIAGNOSIS — I509 Heart failure, unspecified: Secondary | ICD-10-CM | POA: Diagnosis not present

## 2022-04-14 DIAGNOSIS — T82199A Other mechanical complication of unspecified cardiac device, initial encounter: Secondary | ICD-10-CM | POA: Diagnosis not present

## 2022-04-14 DIAGNOSIS — N182 Chronic kidney disease, stage 2 (mild): Secondary | ICD-10-CM | POA: Diagnosis not present

## 2022-04-14 DIAGNOSIS — Z7982 Long term (current) use of aspirin: Secondary | ICD-10-CM | POA: Diagnosis not present

## 2022-04-14 DIAGNOSIS — J90 Pleural effusion, not elsewhere classified: Secondary | ICD-10-CM | POA: Diagnosis not present

## 2022-04-14 DIAGNOSIS — I10 Essential (primary) hypertension: Secondary | ICD-10-CM | POA: Insufficient documentation

## 2022-04-14 DIAGNOSIS — I472 Ventricular tachycardia, unspecified: Secondary | ICD-10-CM

## 2022-04-14 DIAGNOSIS — S065XAD Traumatic subdural hemorrhage with loss of consciousness status unknown, subsequent encounter: Secondary | ICD-10-CM | POA: Diagnosis not present

## 2022-04-14 DIAGNOSIS — X58XXXA Exposure to other specified factors, initial encounter: Secondary | ICD-10-CM | POA: Insufficient documentation

## 2022-04-14 DIAGNOSIS — R079 Chest pain, unspecified: Secondary | ICD-10-CM | POA: Diagnosis not present

## 2022-04-14 DIAGNOSIS — I5020 Unspecified systolic (congestive) heart failure: Secondary | ICD-10-CM

## 2022-04-14 DIAGNOSIS — Z79899 Other long term (current) drug therapy: Secondary | ICD-10-CM | POA: Insufficient documentation

## 2022-04-14 DIAGNOSIS — K219 Gastro-esophageal reflux disease without esophagitis: Secondary | ICD-10-CM | POA: Diagnosis not present

## 2022-04-14 DIAGNOSIS — S065XAA Traumatic subdural hemorrhage with loss of consciousness status unknown, initial encounter: Secondary | ICD-10-CM | POA: Diagnosis not present

## 2022-04-14 DIAGNOSIS — Z91040 Latex allergy status: Secondary | ICD-10-CM | POA: Insufficient documentation

## 2022-04-14 DIAGNOSIS — I252 Old myocardial infarction: Secondary | ICD-10-CM | POA: Diagnosis not present

## 2022-04-14 DIAGNOSIS — H538 Other visual disturbances: Secondary | ICD-10-CM | POA: Diagnosis not present

## 2022-04-14 DIAGNOSIS — W1839XA Other fall on same level, initial encounter: Secondary | ICD-10-CM | POA: Diagnosis not present

## 2022-04-14 DIAGNOSIS — E785 Hyperlipidemia, unspecified: Secondary | ICD-10-CM | POA: Diagnosis not present

## 2022-04-14 DIAGNOSIS — R0789 Other chest pain: Secondary | ICD-10-CM | POA: Diagnosis not present

## 2022-04-14 DIAGNOSIS — H5711 Ocular pain, right eye: Secondary | ICD-10-CM | POA: Diagnosis not present

## 2022-04-14 LAB — COMPREHENSIVE METABOLIC PANEL
ALT: 9 U/L (ref 0–44)
AST: 13 U/L — ABNORMAL LOW (ref 15–41)
Albumin: 3.7 g/dL (ref 3.5–5.0)
Alkaline Phosphatase: 68 U/L (ref 38–126)
Anion gap: 9 (ref 5–15)
BUN: 26 mg/dL — ABNORMAL HIGH (ref 6–20)
CO2: 25 mmol/L (ref 22–32)
Calcium: 8.8 mg/dL — ABNORMAL LOW (ref 8.9–10.3)
Chloride: 102 mmol/L (ref 98–111)
Creatinine, Ser: 1.39 mg/dL — ABNORMAL HIGH (ref 0.61–1.24)
GFR, Estimated: 58 mL/min — ABNORMAL LOW (ref >60)
Glucose, Bld: 113 mg/dL — ABNORMAL HIGH (ref 70–99)
Potassium: 4.3 mmol/L (ref 3.5–5.1)
Sodium: 136 mmol/L (ref 135–145)
Total Bilirubin: 0.6 mg/dL (ref 0.3–1.2)
Total Protein: 7.2 g/dL (ref 6.5–8.1)

## 2022-04-14 LAB — CBC WITH DIFFERENTIAL/PLATELET
Abs Immature Granulocytes: 0.01 10*3/uL (ref 0.00–0.07)
Basophils Absolute: 0 10*3/uL (ref 0.0–0.1)
Basophils Relative: 0 %
Eosinophils Absolute: 0.2 10*3/uL (ref 0.0–0.5)
Eosinophils Relative: 3 %
HCT: 33.2 % — ABNORMAL LOW (ref 39.0–52.0)
Hemoglobin: 10.3 g/dL — ABNORMAL LOW (ref 13.0–17.0)
Immature Granulocytes: 0 %
Lymphocytes Relative: 21 %
Lymphs Abs: 0.9 10*3/uL (ref 0.7–4.0)
MCH: 26.1 pg (ref 26.0–34.0)
MCHC: 31 g/dL (ref 30.0–36.0)
MCV: 84.1 fL (ref 80.0–100.0)
Monocytes Absolute: 0.5 10*3/uL (ref 0.1–1.0)
Monocytes Relative: 10 %
Neutro Abs: 3 10*3/uL (ref 1.7–7.7)
Neutrophils Relative %: 66 %
Platelets: 177 10*3/uL (ref 150–400)
RBC: 3.95 MIL/uL — ABNORMAL LOW (ref 4.22–5.81)
RDW: 15.7 % — ABNORMAL HIGH (ref 11.5–15.5)
WBC: 4.6 10*3/uL (ref 4.0–10.5)
nRBC: 0 % (ref 0.0–0.2)

## 2022-04-14 LAB — TROPONIN I (HIGH SENSITIVITY)
Troponin I (High Sensitivity): 40 ng/L — ABNORMAL HIGH (ref <18)
Troponin I (High Sensitivity): 73 ng/L — ABNORMAL HIGH (ref <18)

## 2022-04-14 LAB — LACTATE DEHYDROGENASE: LDH: 148 U/L (ref 98–192)

## 2022-04-14 LAB — PROTIME-INR
INR: 1.2 (ref 0.8–1.2)
Prothrombin Time: 15.4 seconds — ABNORMAL HIGH (ref 11.4–15.2)

## 2022-04-14 LAB — MAGNESIUM: Magnesium: 2 mg/dL (ref 1.7–2.4)

## 2022-04-14 NOTE — Discharge Instructions (Signed)
Please reach out to your cardiologist for medication adjustments.  Continue your current medications as prescribed.  Get rechecked immediately if you have new or concerning symptoms. ?

## 2022-04-14 NOTE — ED Notes (Signed)
BP taken with doppler. MAP heard to be 112  ?

## 2022-04-14 NOTE — ED Notes (Signed)
Date and time results received: 04/14/22 0606 ? ? ?(use smartphrase ".now" to insert current time) ? ?Test: Repeat Troponin  ?Critical Value: 73 ? ?Name of Provider Notified: Dr. Rudean Haskell  ? ?Orders Received? Or Actions Taken?: No new orders at this time ? ?

## 2022-04-14 NOTE — ED Provider Notes (Signed)
?[place] ?Provider Note ? ? ?CSN: [id number] ?Arrival date & time:    ? ?  ? ?History ? ?Chief Complaint  ?Patient presents with  ? ICD firing  ? ? ?Chad [name]. is a 61 y.o. male. ? ?The history is provided by the patient, the EMS personnel and medical records.  ?Chad [name]. is a 61 y.o. male who presents to the Emergency Department complaining of defibrillator fired.  He presents to the ED by EMS for evaluation after his defibrillator went off.  He had some associated chest discomfort and sensation of doom just prior to this occurring.  He thinks it may have gone off twice.  Sxs are overall improving.  He has a hx/o ICD placement, LVAD.  No recent illnessess or injuries.  No recent medication changes.  Sxs are severe in nature.   ?  ? ?Home Medications ?Prior to Admission medications   ?Medication Sig Start Date End Date Taking? Authorizing Provider  ?acetaminophen (TYLENOL) 325 MG tablet Take 2 tablets (650 mg total) by mouth every 4 (four) hours as needed for headache or mild pain. 05/03/20   Conrad West Ishpeming, NP  ?Alirocumab (PRALUENT) 75 MG/ML SOAJ Inject 75 mg into the skin every 14 (fourteen) days. ?Patient not taking: Reported on 02/12/2022 09/03/20   Bensimhon, Shaune Pascal, MD  ?allopurinol (ZYLOPRIM) 100 MG tablet Take 1 tablet (100 mg total) by mouth daily. 10/18/21   Bensimhon, Shaune Pascal, MD  ?amLODipine (NORVASC) 5 MG tablet Take 5 mg by mouth daily. 01/15/22   [provider]  ?carvedilol (COREG) 6.25 MG tablet Take 1 tablet (6.25 mg total) by mouth 2 (two) times daily with a meal. If systolic is less than 90 you may hold Coreg until systolic is greater than 90, then resume taking 10/21/21   Bensimhon, Shaune Pascal, MD  ?colchicine 0.6 MG tablet Take 1 tablet (0.6 mg total) by mouth daily. 11/03/21   Bensimhon, Shaune Pascal, MD  ?[place] 5 MG TABS tablet Take 5 mg by mouth every morning. 02/15/21   [provider]  ?ferrous sulfate 325 (65 FE) MG  tablet Take 325 mg by mouth daily with breakfast.    [provider]  ?fluticasone (FLONASE) 50 MCG/ACT nasal spray Place 2 sprays into both nostrils daily as needed for allergies or rhinitis.     [provider]  ?gabapentin (NEURONTIN) 300 MG capsule Take 1 capsule (300 mg total) by mouth at bedtime. ?Patient taking differently: Take 300 mg by mouth 3 (three) times daily. 08/25/20   Bensimhon, Shaune Pascal, MD  ?hydrALAZINE (APRESOLINE) 100 MG tablet Take 100 mg by mouth 3 (three) times daily. 01/19/21   [provider]  ?isosorbide mononitrate (IMDUR) 30 MG 24 hr tablet Take 60 mg by mouth in the morning and at bedtime. 03/06/21   [provider]  ?naloxone (NARCAN) nasal spray 4 mg/0.1 mL Place 1 spray into the nose as needed (accidental overdose).    [provider]  ?nitroGLYCERIN (NITROSTAT) 0.4 MG SL tablet DISSOLVE ONE TABLET UNDER THE TONGUE EVERY 5 MINUTES AS NEEDED FOR CHEST PAIN. MAY TAKE IF DIASTOLIC BP IS GREATER THAN 90 ?Patient taking differently: Place 0.4 mg under the tongue every 5 (five) minutes as needed for chest pain. 01/30/22   Bensimhon, Shaune Pascal, MD  ?Oxycodone HCl 20 MG TABS Take 20 mg by mouth every 6 (six) hours. 12/22/19   [provider]  ?sacubitril-valsartan (ENTRESTO) 97-103 MG Take 1 tablet by  mouth 2 (two) times daily.    [provider]  ?spironolactone (ALDACTONE) 25 MG tablet Take 25 mg by mouth daily.    [provider]  ?   ? ?Allergies    ?Contrast media [iodinated contrast media], Iohexol, Lipitor [atorvastatin calcium], Shellfish allergy, Sulfa antibiotics, Sulfonamide derivatives, Almond oil, Metrizamide, Zocor [simvastatin], Food, Latex, and Peach [prunus persica]   ? ?Review of Systems   ?Review of Systems  ?All other systems reviewed and are negative. ? ?Physical Exam ?Updated Vital Signs ?Pulse 87   Temp 99.4 ?F (37.4 ?C) (Oral)   Resp 17   SpO2 97%  ?Physical Exam ?Vitals and nursing note reviewed.   ?Constitutional:   ?   Appearance: He is well-developed.  ?HENT:  ?   Head: Normocephalic and atraumatic.  ?Cardiovascular:  ?   Rate and Rhythm: Normal rate and regular rhythm.  ?   Heart sounds: No murmur heard. ?   Comments: Mechanical hum ?Pulmonary:  ?   Effort: Pulmonary effort is normal. No respiratory distress.  ?   Breath sounds: Normal breath sounds.  ?Abdominal:  ?   Palpations: Abdomen is soft.  ?   Tenderness: There is no abdominal tenderness. There is no guarding or rebound.  ?Musculoskeletal:     ?   General: No tenderness.  ?Skin: ?   General: Skin is warm and dry.  ?Neurological:  ?   Mental Status: He is alert and oriented to person, place, and time.  ?Psychiatric:     ?   Behavior: Behavior normal.  ? ? ?ED Results / Procedures / Treatments   ?Labs ?(all labs ordered are listed, but only abnormal results are displayed) ?Labs Reviewed  ?COMPREHENSIVE METABOLIC PANEL - Abnormal; Notable for the following components:  ?    Result Value  ? Glucose, Bld 113 (*)   ? BUN 26 (*)   ? Creatinine, Ser 1.39 (*)   ? Calcium 8.8 (*)   ? AST 13 (*)   ? GFR, Estimated 58 (*)   ? All other components within normal limits  ?CBC WITH DIFFERENTIAL/PLATELET - Abnormal; Notable for the following components:  ? RBC 3.95 (*)   ? Hemoglobin 10.3 (*)   ? HCT 33.2 (*)   ? RDW 15.7 (*)   ? All other components within normal limits  ?PROTIME-INR - Abnormal; Notable for the following components:  ? Prothrombin Time 15.4 (*)   ? All other components within normal limits  ?TROPONIN I (HIGH SENSITIVITY) - Abnormal; Notable for the following components:  ? Troponin I (High Sensitivity) 40 (*)   ? All other components within normal limits  ?TROPONIN I (HIGH SENSITIVITY) - Abnormal; Notable for the following components:  ? Troponin I (High Sensitivity) 73 (*)   ? All other components within normal limits  ?RESP PANEL BY RT-PCR (FLU A&B, COVID) [id number]  ?LACTATE DEHYDROGENASE  ?MAGNESIUM  ? ? ?EKG ?EKG  Interpretation ? ?Date/Time:  Friday April 14 2022 02:54:44 EDT ?Ventricular Rate:  95 ?PR Interval:  192 ?QRS Duration: 83 ?QT Interval:  381 ?QTC Calculation: 479 ?R Axis:   -65 ?Text Interpretation: Poor quality data, interpretation may be affected Sinus rhythm Multiform ventricular premature complexes Right atrial enlargement Confirmed by Quintella Reichert [phone]) on 04/14/2022 3:04:11 AM ? ?Radiology ?DG Chest Port 1 View ? ?Result Date: 04/14/2022 ?CLINICAL DATA:  Chest pain. EXAM: PORTABLE CHEST 1 VIEW COMPARISON:  Chest radiograph dated 02/12/2022. FINDINGS: Small left pleural effusion and left lung base atelectasis or  infiltrate. No pneumothorax. Stable cardiomegaly. Median sternotomy wires and mechanical cardiac valve. 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 LVAD and left pectoral pacemaker device. No acute osseous pathology. IMPRESSION: 1. Small left pleural effusion and left lung base atelectasis or infiltrate. 2. Stable cardiomegaly and aneurysmal dilatation of the aortic arch status post endovascular stent graft repair. Electronically Signed   By: Anner Crete M.D.   On: 04/14/2022 03:07   ? ?Procedures ?Procedures  ? ? ?Medications Ordered in ED ?Medications - No data to display ? ?ED Course/ Medical Decision Making/ A&P ?  ?                        ?Medical Decision Making ?Amount and/or Complexity of Data Reviewed ?Labs: ordered. ?Radiology: ordered. ? ? ?Patient with extensive cardiac history here for evaluation following his defibrillator firing.  He has some residual chest discomfort at time of ED evaluation but overall improving.  He is well perfused on evaluation.  His vad appears to be operating appropriately.  His device was interrogated and he did receive 1 appropriate shock at 0038 for V. tach that lasted 1 minute and 19 seconds.  This was at 42 J.  Discussed with our LVAD team, recommends discussion with patient's  primary team at [facility].  Discussed with on-call NP at [facility], no recommendations for changes to his current medications or initiating new medicines.  Recommendation for discharge with cardiology follow-up.  Discussed with patient findings of s
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
## 2022-04-14 NOTE — ED Notes (Signed)
Pt refused COVID swab at this time

## 2022-04-17 DIAGNOSIS — Z95811 Presence of heart assist device: Secondary | ICD-10-CM | POA: Diagnosis not present

## 2022-04-17 DIAGNOSIS — Z7901 Long term (current) use of anticoagulants: Secondary | ICD-10-CM | POA: Diagnosis not present

## 2022-04-17 LAB — CUP PACEART REMOTE DEVICE CHECK
Battery Remaining Longevity: 42 mo
Battery Remaining Percentage: 47 %
Brady Statistic RV Percent Paced: 0 %
Date Time Interrogation Session: 20230421034100
HighPow Impedance: 49 Ohm
Implantable Lead Implant Date: 20171114
Implantable Lead Location: 753860
Implantable Lead Model: 181
Implantable Lead Serial Number: 333496
Implantable Pulse Generator Implant Date: 20131220
Lead Channel Impedance Value: 453 Ohm
Lead Channel Pacing Threshold Amplitude: 0.7 V
Lead Channel Pacing Threshold Pulse Width: 0.4 ms
Lead Channel Setting Pacing Amplitude: 2 V
Lead Channel Setting Pacing Pulse Width: 0.4 ms
Lead Channel Setting Sensing Sensitivity: 0.5 mV
Pulse Gen Serial Number: 124654

## 2022-04-18 DIAGNOSIS — Z95811 Presence of heart assist device: Secondary | ICD-10-CM | POA: Diagnosis not present

## 2022-04-18 DIAGNOSIS — I255 Ischemic cardiomyopathy: Secondary | ICD-10-CM | POA: Diagnosis not present

## 2022-04-18 DIAGNOSIS — Z951 Presence of aortocoronary bypass graft: Secondary | ICD-10-CM | POA: Diagnosis not present

## 2022-04-18 DIAGNOSIS — Z79899 Other long term (current) drug therapy: Secondary | ICD-10-CM | POA: Diagnosis not present

## 2022-04-18 DIAGNOSIS — Z95810 Presence of automatic (implantable) cardiac defibrillator: Secondary | ICD-10-CM | POA: Diagnosis not present

## 2022-04-18 DIAGNOSIS — R9431 Abnormal electrocardiogram [ECG] [EKG]: Secondary | ICD-10-CM | POA: Diagnosis not present

## 2022-04-18 DIAGNOSIS — I10 Essential (primary) hypertension: Secondary | ICD-10-CM | POA: Diagnosis not present

## 2022-04-18 DIAGNOSIS — I5023 Acute on chronic systolic (congestive) heart failure: Secondary | ICD-10-CM | POA: Diagnosis not present

## 2022-04-18 DIAGNOSIS — Z905 Acquired absence of kidney: Secondary | ICD-10-CM | POA: Diagnosis not present

## 2022-04-18 DIAGNOSIS — I499 Cardiac arrhythmia, unspecified: Secondary | ICD-10-CM | POA: Diagnosis not present

## 2022-04-18 DIAGNOSIS — S065XAA Traumatic subdural hemorrhage with loss of consciousness status unknown, initial encounter: Secondary | ICD-10-CM | POA: Diagnosis not present

## 2022-04-18 DIAGNOSIS — Z8679 Personal history of other diseases of the circulatory system: Secondary | ICD-10-CM | POA: Diagnosis not present

## 2022-04-18 DIAGNOSIS — Z953 Presence of xenogenic heart valve: Secondary | ICD-10-CM | POA: Diagnosis not present

## 2022-04-18 DIAGNOSIS — I5022 Chronic systolic (congestive) heart failure: Secondary | ICD-10-CM | POA: Diagnosis not present

## 2022-04-18 DIAGNOSIS — Z7984 Long term (current) use of oral hypoglycemic drugs: Secondary | ICD-10-CM | POA: Diagnosis not present

## 2022-04-20 DIAGNOSIS — G894 Chronic pain syndrome: Secondary | ICD-10-CM | POA: Diagnosis not present

## 2022-04-20 DIAGNOSIS — M79661 Pain in right lower leg: Secondary | ICD-10-CM | POA: Diagnosis not present

## 2022-04-20 DIAGNOSIS — M542 Cervicalgia: Secondary | ICD-10-CM | POA: Diagnosis not present

## 2022-04-20 DIAGNOSIS — M5137 Other intervertebral disc degeneration, lumbosacral region: Secondary | ICD-10-CM | POA: Diagnosis not present

## 2022-04-20 DIAGNOSIS — M25552 Pain in left hip: Secondary | ICD-10-CM | POA: Diagnosis not present

## 2022-04-20 DIAGNOSIS — Q249 Congenital malformation of heart, unspecified: Secondary | ICD-10-CM | POA: Diagnosis not present

## 2022-04-20 DIAGNOSIS — M25551 Pain in right hip: Secondary | ICD-10-CM | POA: Diagnosis not present

## 2022-04-20 DIAGNOSIS — G890 Central pain syndrome: Secondary | ICD-10-CM | POA: Diagnosis not present

## 2022-04-20 DIAGNOSIS — M79662 Pain in left lower leg: Secondary | ICD-10-CM | POA: Diagnosis not present

## 2022-04-20 DIAGNOSIS — M5431 Sciatica, right side: Secondary | ICD-10-CM | POA: Diagnosis not present

## 2022-04-20 DIAGNOSIS — M545 Low back pain, unspecified: Secondary | ICD-10-CM | POA: Diagnosis not present

## 2022-04-24 DIAGNOSIS — Z419 Encounter for procedure for purposes other than remedying health state, unspecified: Secondary | ICD-10-CM | POA: Diagnosis not present

## 2022-04-25 DIAGNOSIS — I5023 Acute on chronic systolic (congestive) heart failure: Secondary | ICD-10-CM | POA: Diagnosis not present

## 2022-04-25 DIAGNOSIS — Z95811 Presence of heart assist device: Secondary | ICD-10-CM | POA: Diagnosis not present

## 2022-04-25 DIAGNOSIS — Z7901 Long term (current) use of anticoagulants: Secondary | ICD-10-CM | POA: Diagnosis not present

## 2022-04-29 IMAGING — CT CT CHEST W/O CM
2 of 3 series · 15 of 36 positions shown, 18 images · non-contrast
Comparison: CT, 09/02/2021.  Current chest radiograph.

CLINICAL DATA: Chest pain developing early this morning. History of
a left ventricular assist device.

EXAM:
CT CHEST WITHOUT CONTRAST
TECHNIQUE: Multidetector CT imaging of the chest was performed following the
standard protocol without IV contrast.

[Series 3: chest w/o 2mm st · axial · non-contrast · 0.75mm/px · z∈[-355,-63]mm · 12 of 172 slices shown, 15 images]
[im 13/172  mediastinal]
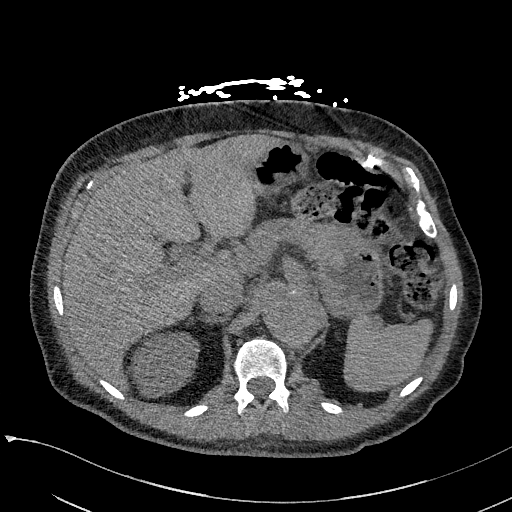
[im 13/172  lung]
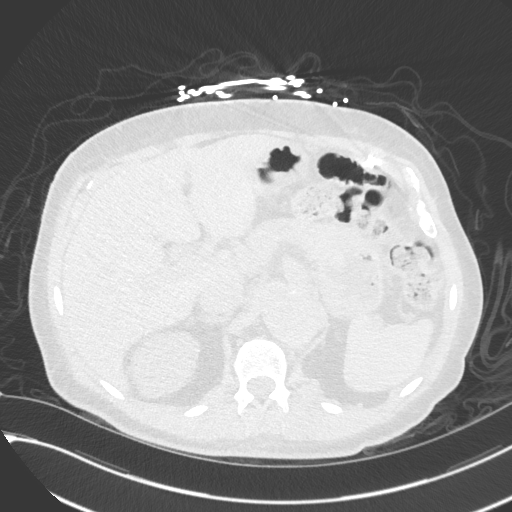
[im 26/172  lung]
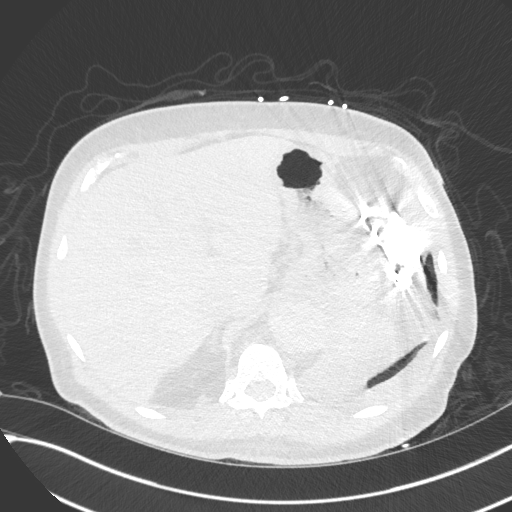
[im 39/172  lung]
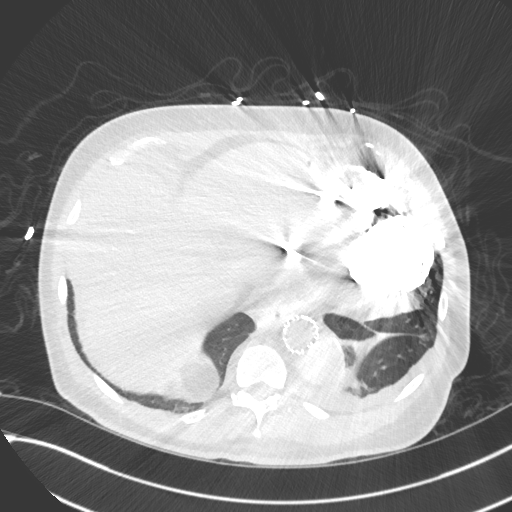
[im 51/172  lung]
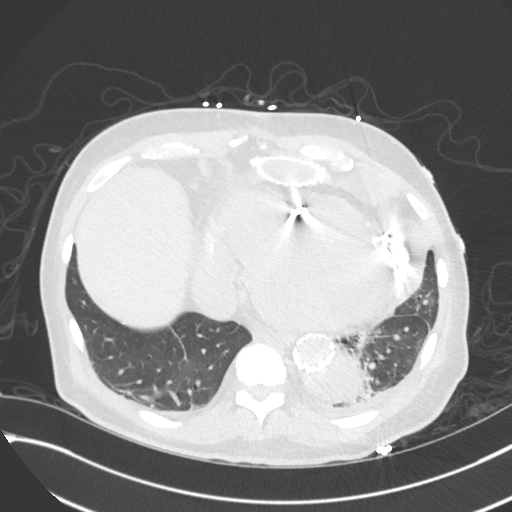
[im 64/172  mediastinal]
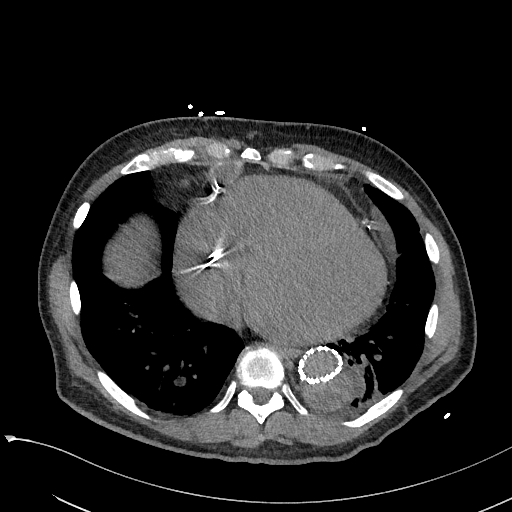
[im 64/172  lung]
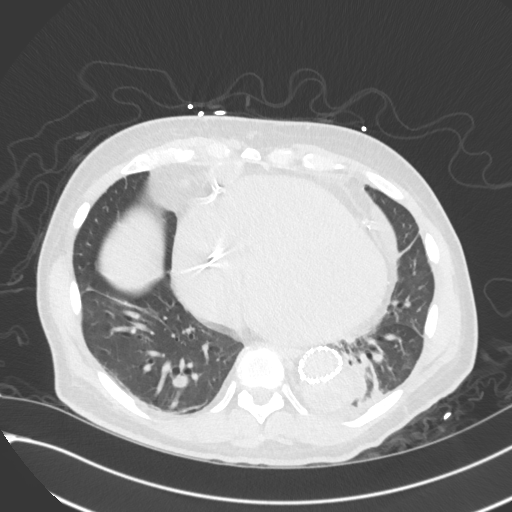
[im 77/172  lung]
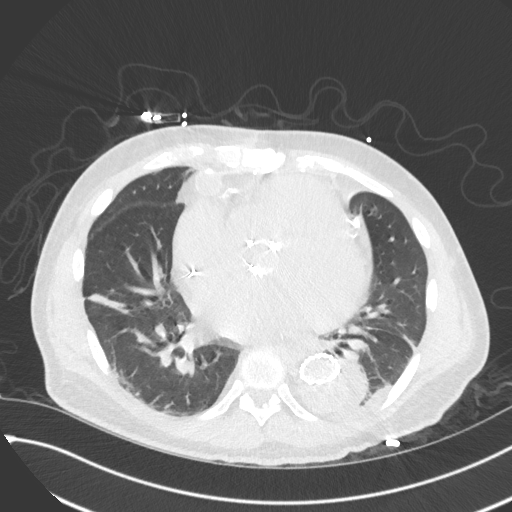
[im 96/172  lung]
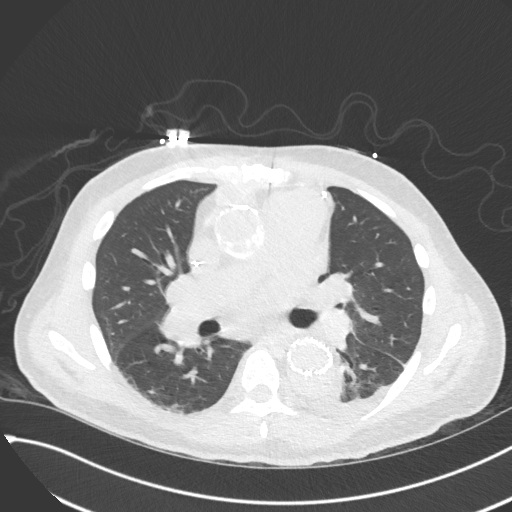
[im 108/172  lung]
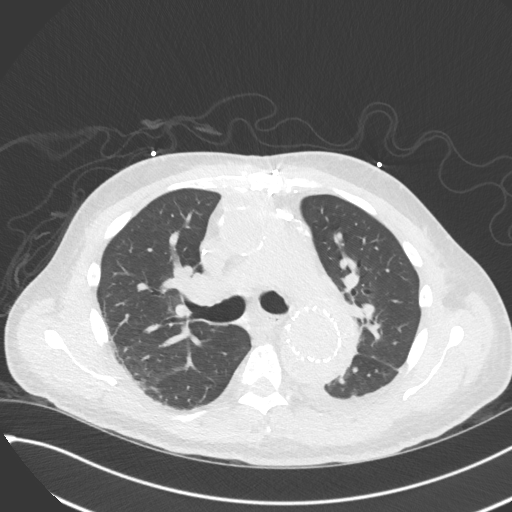
[im 121/172  mediastinal]
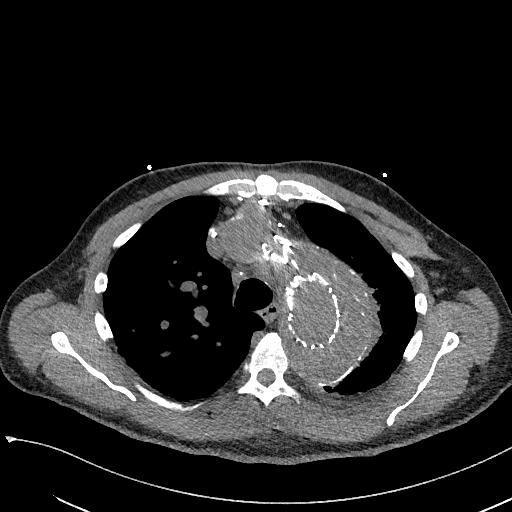
[im 121/172  lung]
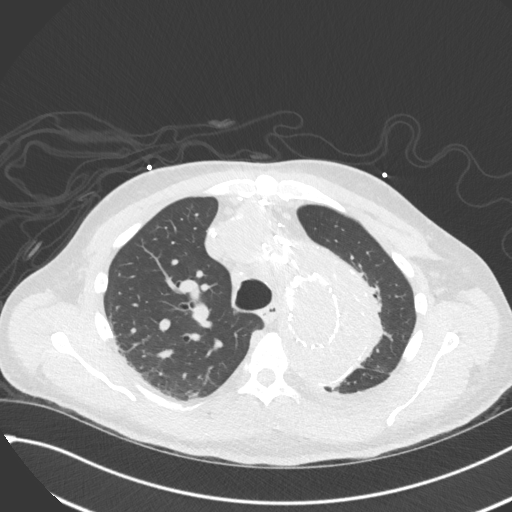
[im 134/172  lung]
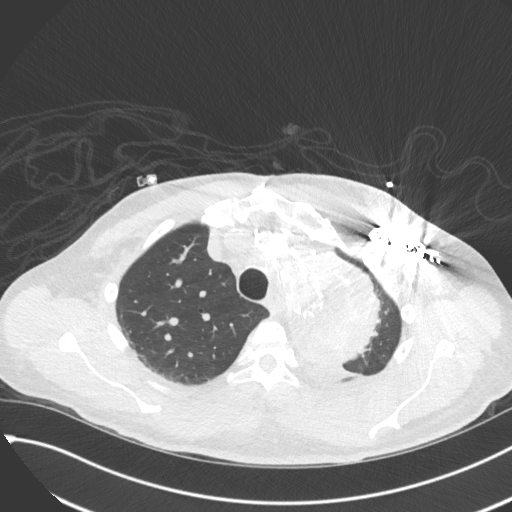
[im 146/172  lung]
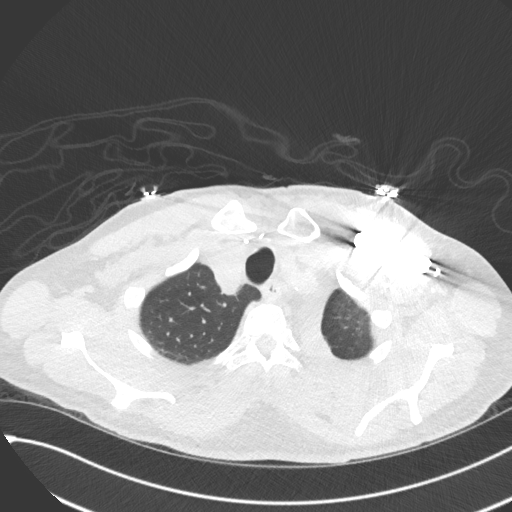
[im 159/172  lung]
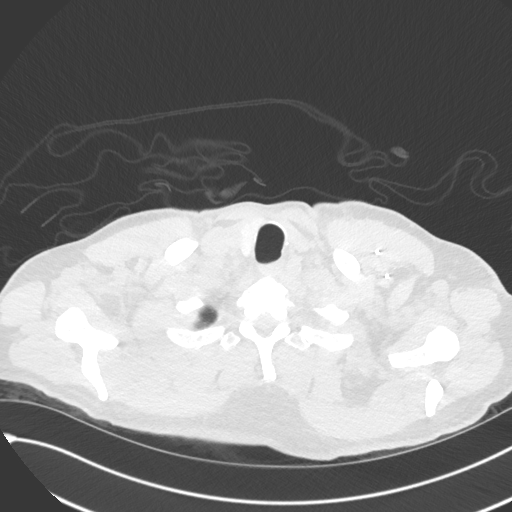

[Series 6: chest w/o 2mm st cor · coronal · non-contrast · 0.67mm/px · 3 of 125 slices shown]
[im 25/125  lung]
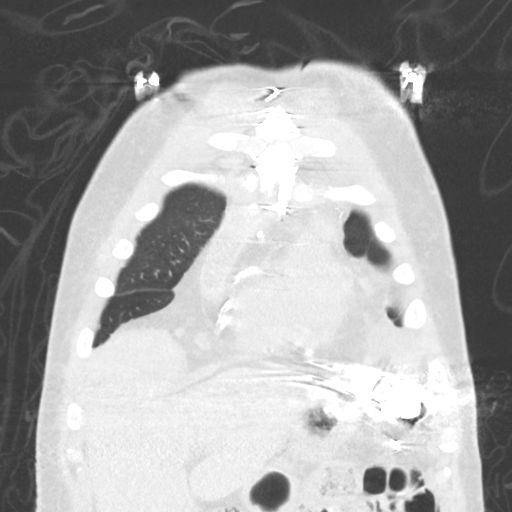
[im 50/125  lung]
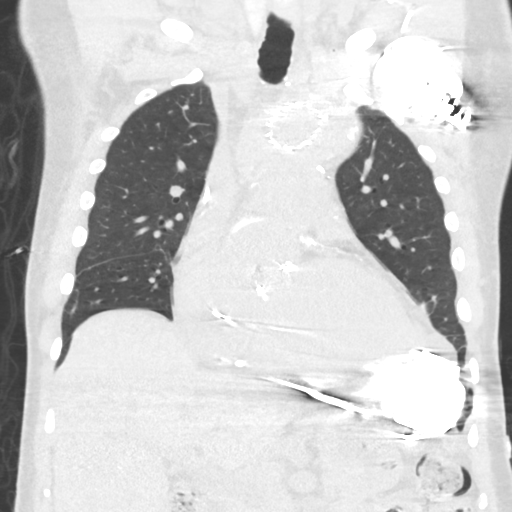
[im 75/125  lung]
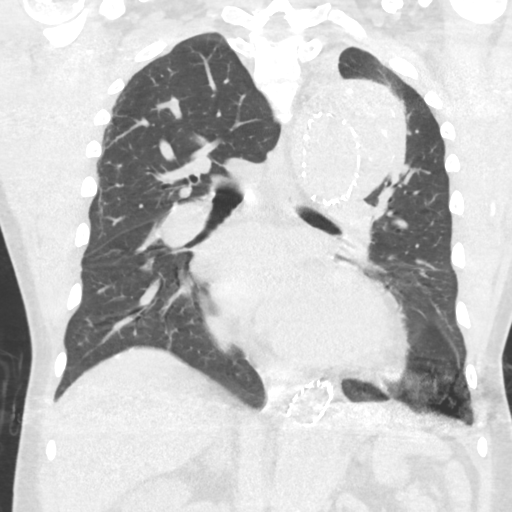

[15 of 36 positions shown; findings below may reference images not displayed]

FINDINGS: Cardiovascular: Heart mildly enlarged. Stable prosthetic aortic
valve. Three-vessel coronary artery calcifications. No change in the
position of the left ventricular assist device.

Stent graft excludes a thoracic aortic aneurysm. Dilation is without
significant change from the prior CT. Dilated main pulmonary artery
is also unchanged.

Mediastinum/Nodes: No mediastinal hematoma. Previous left
thyroidectomy. No neck base, mediastinal or hilar masses or enlarged
lymph nodes. Trachea and esophagus are unremarkable.

Lungs/Pleura: Mild areas of linear and discoid dependent
atelectasis, most evident in the left lower lobe, similar to the
prior CT. No evidence of pneumonia or pulmonary edema. Trace left
pleural effusion. No pneumothorax.

Upper Abdomen: No acute findings.  Marked left renal atrophy.

Musculoskeletal: No fracture or acute finding.  No bone lesion.
IMPRESSION: 1. Exam limited by lack of intravenous contrast. No convincing acute
abnormality.
2. Status post aortic valve replacement and thoracic aortic stent
graft repair. Degree of dilation of the native aorta is unchanged
from the prior CT.
3. Persistent mild cardiomegaly. No change position of the left
ventricular assist device.
4. No acute findings in the lungs.

Aortic aneurysm NOS ([id number]).

## 2022-04-29 IMAGING — DX DG CHEST 1V PORT
1 series · 1 of 1 positions shown · non-contrast
Comparison: Chest x-ray 09/02/2021.

CLINICAL DATA: 60-year-old male with history of severe chest pain
starting 30 minutes ago. Shortness of breath and nausea.

EXAM:
PORTABLE CHEST 1 VIEW

[chest]
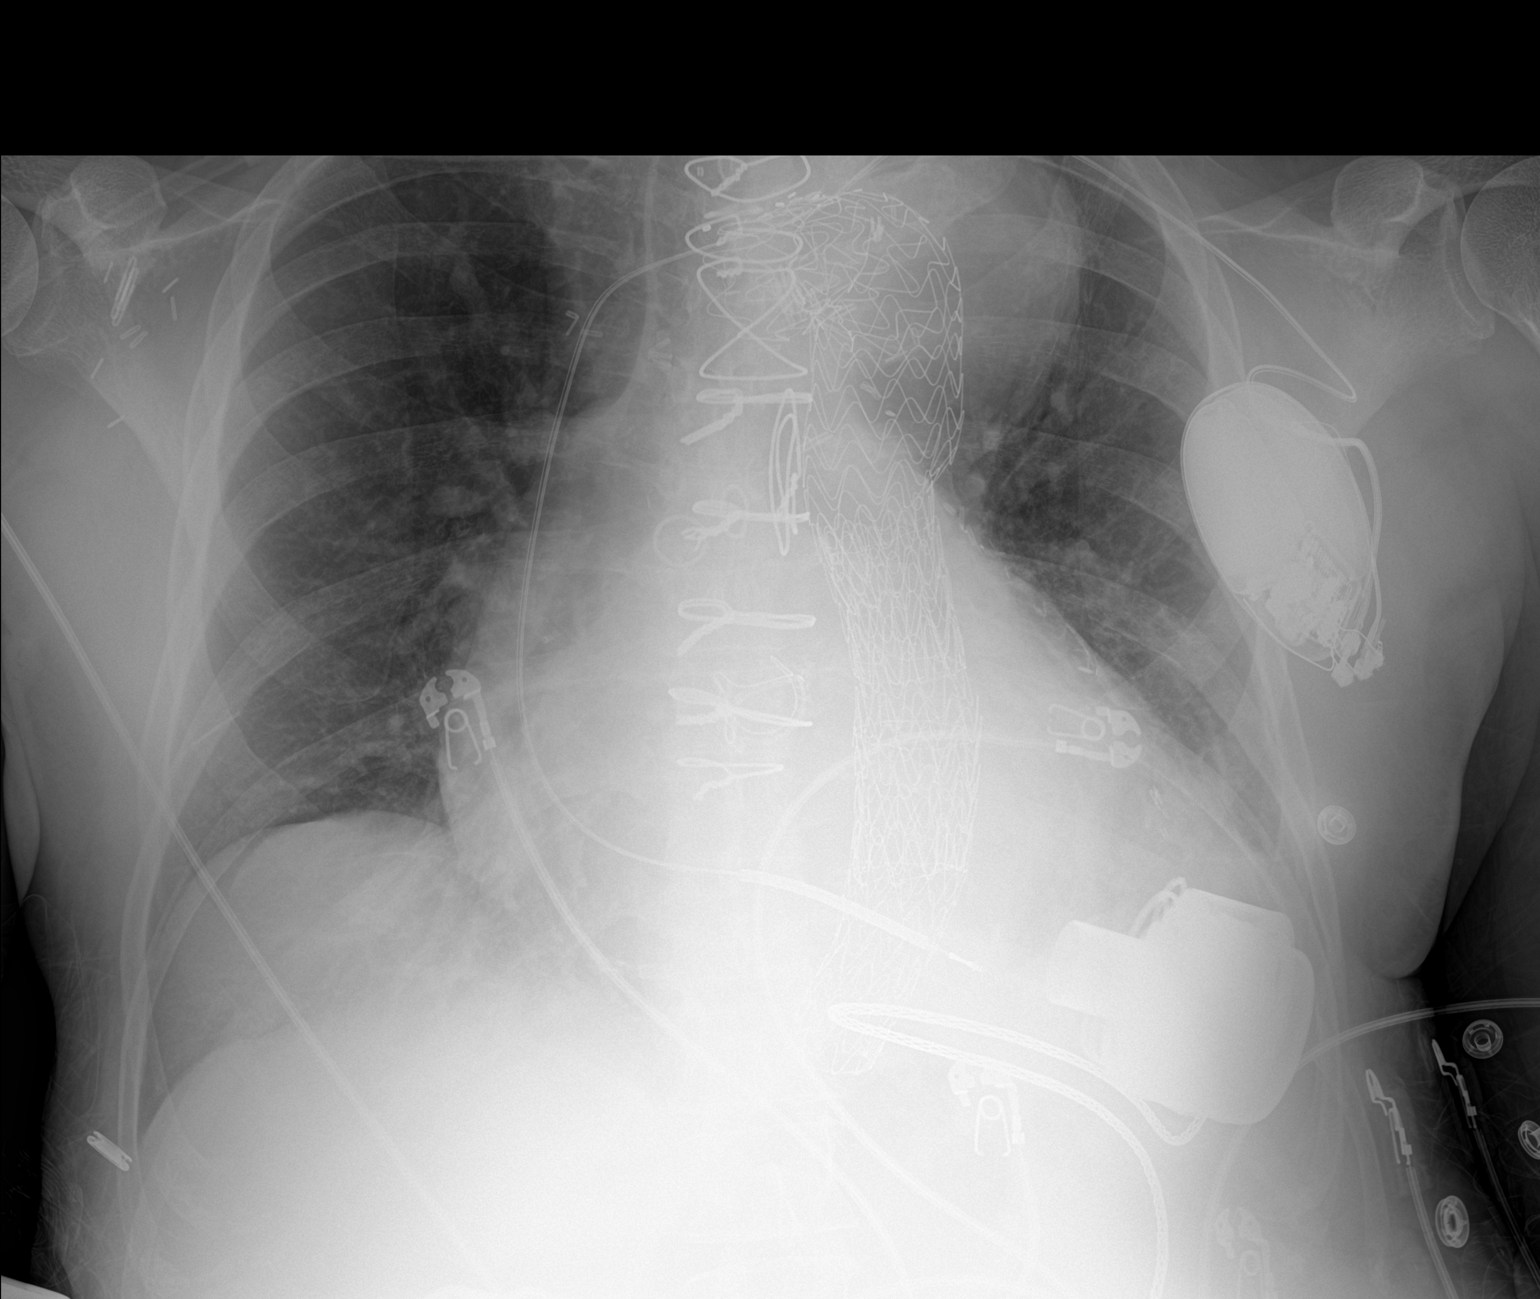

[1 of 1 positions shown; findings below may reference images not displayed]

FINDINGS: Lung volumes are normal. No definite pleural effusions. No
pneumothorax. No evidence of pulmonary edema. Heart size is
moderately enlarged. Upper mediastinal contours are grossly
distorted, with persistent aneurysmal dilatation of the proximal
descending thoracic aorta in this patient status post aortic stent
graft placement. The overall appearance of this is very similar to
the prior examination from 09/02/2021. Left ventricular assist
device projecting over the apex of the heart. Status post median
sternotomy for CABG and aortic valve replacement with a stented
bioprosthesis. Left-sided pacemaker/AICD in place with lead tip
projecting over the expected location of the right ventricular apex.
IMPRESSION: 1. Radiographic appearance the chest is unchanged, as detailed
above. No definite acute findings are noted on today's examination.

## 2022-05-02 NOTE — Progress Notes (Signed)
Remote ICD transmission.   

## 2022-05-04 DIAGNOSIS — Z95811 Presence of heart assist device: Secondary | ICD-10-CM | POA: Diagnosis not present

## 2022-05-04 DIAGNOSIS — Z7901 Long term (current) use of anticoagulants: Secondary | ICD-10-CM | POA: Diagnosis not present

## 2022-05-10 DIAGNOSIS — Z95811 Presence of heart assist device: Secondary | ICD-10-CM | POA: Diagnosis not present

## 2022-05-10 DIAGNOSIS — Z7901 Long term (current) use of anticoagulants: Secondary | ICD-10-CM | POA: Diagnosis not present

## 2022-05-15 DIAGNOSIS — S065X0A Traumatic subdural hemorrhage without loss of consciousness, initial encounter: Secondary | ICD-10-CM | POA: Diagnosis not present

## 2022-05-16 DIAGNOSIS — S065X0A Traumatic subdural hemorrhage without loss of consciousness, initial encounter: Secondary | ICD-10-CM | POA: Diagnosis not present

## 2022-05-17 DIAGNOSIS — S065X0A Traumatic subdural hemorrhage without loss of consciousness, initial encounter: Secondary | ICD-10-CM | POA: Diagnosis not present

## 2022-05-18 DIAGNOSIS — M5431 Sciatica, right side: Secondary | ICD-10-CM | POA: Diagnosis not present

## 2022-05-18 DIAGNOSIS — Q249 Congenital malformation of heart, unspecified: Secondary | ICD-10-CM | POA: Diagnosis not present

## 2022-05-18 DIAGNOSIS — M25552 Pain in left hip: Secondary | ICD-10-CM | POA: Diagnosis not present

## 2022-05-18 DIAGNOSIS — M542 Cervicalgia: Secondary | ICD-10-CM | POA: Diagnosis not present

## 2022-05-18 DIAGNOSIS — M25551 Pain in right hip: Secondary | ICD-10-CM | POA: Diagnosis not present

## 2022-05-18 DIAGNOSIS — M79661 Pain in right lower leg: Secondary | ICD-10-CM | POA: Diagnosis not present

## 2022-05-18 DIAGNOSIS — Z79891 Long term (current) use of opiate analgesic: Secondary | ICD-10-CM | POA: Diagnosis not present

## 2022-05-18 DIAGNOSIS — M5137 Other intervertebral disc degeneration, lumbosacral region: Secondary | ICD-10-CM | POA: Diagnosis not present

## 2022-05-18 DIAGNOSIS — M545 Low back pain, unspecified: Secondary | ICD-10-CM | POA: Diagnosis not present

## 2022-05-18 DIAGNOSIS — M79662 Pain in left lower leg: Secondary | ICD-10-CM | POA: Diagnosis not present

## 2022-05-18 DIAGNOSIS — S065X0A Traumatic subdural hemorrhage without loss of consciousness, initial encounter: Secondary | ICD-10-CM | POA: Diagnosis not present

## 2022-05-18 DIAGNOSIS — G890 Central pain syndrome: Secondary | ICD-10-CM | POA: Diagnosis not present

## 2022-05-18 DIAGNOSIS — G894 Chronic pain syndrome: Secondary | ICD-10-CM | POA: Diagnosis not present

## 2022-05-19 DIAGNOSIS — Z95811 Presence of heart assist device: Secondary | ICD-10-CM | POA: Diagnosis not present

## 2022-05-19 DIAGNOSIS — Z7901 Long term (current) use of anticoagulants: Secondary | ICD-10-CM | POA: Diagnosis not present

## 2022-05-19 DIAGNOSIS — S065X0A Traumatic subdural hemorrhage without loss of consciousness, initial encounter: Secondary | ICD-10-CM | POA: Diagnosis not present

## 2022-05-20 ENCOUNTER — Other Ambulatory Visit (HOSPITAL_COMMUNITY): Payer: Self-pay | Admitting: Internal Medicine

## 2022-05-20 DIAGNOSIS — I5042 Chronic combined systolic (congestive) and diastolic (congestive) heart failure: Secondary | ICD-10-CM

## 2022-05-20 DIAGNOSIS — Z95811 Presence of heart assist device: Secondary | ICD-10-CM

## 2022-05-22 DIAGNOSIS — S065X0A Traumatic subdural hemorrhage without loss of consciousness, initial encounter: Secondary | ICD-10-CM | POA: Diagnosis not present

## 2022-05-23 DIAGNOSIS — S065X0A Traumatic subdural hemorrhage without loss of consciousness, initial encounter: Secondary | ICD-10-CM | POA: Diagnosis not present

## 2022-05-24 DIAGNOSIS — S065X0A Traumatic subdural hemorrhage without loss of consciousness, initial encounter: Secondary | ICD-10-CM | POA: Diagnosis not present

## 2022-05-25 ENCOUNTER — Ambulatory Visit: Payer: Medicaid Other | Admitting: Family Medicine

## 2022-05-25 DIAGNOSIS — S065X0A Traumatic subdural hemorrhage without loss of consciousness, initial encounter: Secondary | ICD-10-CM | POA: Diagnosis not present

## 2022-05-25 DIAGNOSIS — Z419 Encounter for procedure for purposes other than remedying health state, unspecified: Secondary | ICD-10-CM | POA: Diagnosis not present

## 2022-05-26 DIAGNOSIS — S065X0A Traumatic subdural hemorrhage without loss of consciousness, initial encounter: Secondary | ICD-10-CM | POA: Diagnosis not present

## 2022-05-29 DIAGNOSIS — S065X0A Traumatic subdural hemorrhage without loss of consciousness, initial encounter: Secondary | ICD-10-CM | POA: Diagnosis not present

## 2022-05-30 ENCOUNTER — Other Ambulatory Visit (HOSPITAL_COMMUNITY): Payer: Self-pay | Admitting: Internal Medicine

## 2022-05-30 ENCOUNTER — Encounter: Payer: Self-pay | Admitting: *Deleted

## 2022-05-30 DIAGNOSIS — I5042 Chronic combined systolic (congestive) and diastolic (congestive) heart failure: Secondary | ICD-10-CM

## 2022-05-30 DIAGNOSIS — S065X0A Traumatic subdural hemorrhage without loss of consciousness, initial encounter: Secondary | ICD-10-CM | POA: Diagnosis not present

## 2022-05-30 DIAGNOSIS — Z95811 Presence of heart assist device: Secondary | ICD-10-CM

## 2022-05-31 ENCOUNTER — Ambulatory Visit (INDEPENDENT_AMBULATORY_CARE_PROVIDER_SITE_OTHER): Payer: Medicaid Other | Admitting: Family Medicine

## 2022-05-31 ENCOUNTER — Encounter: Payer: Self-pay | Admitting: Family Medicine

## 2022-05-31 VITALS — BP 116/89 | HR 49 | Ht 71.0 in | Wt 186.5 lb

## 2022-05-31 DIAGNOSIS — E785 Hyperlipidemia, unspecified: Secondary | ICD-10-CM | POA: Diagnosis not present

## 2022-05-31 DIAGNOSIS — Z0000 Encounter for general adult medical examination without abnormal findings: Secondary | ICD-10-CM | POA: Diagnosis not present

## 2022-05-31 DIAGNOSIS — R0789 Other chest pain: Secondary | ICD-10-CM | POA: Diagnosis not present

## 2022-05-31 DIAGNOSIS — Z7901 Long term (current) use of anticoagulants: Secondary | ICD-10-CM

## 2022-05-31 DIAGNOSIS — S065X0A Traumatic subdural hemorrhage without loss of consciousness, initial encounter: Secondary | ICD-10-CM | POA: Diagnosis not present

## 2022-05-31 DIAGNOSIS — Z95811 Presence of heart assist device: Secondary | ICD-10-CM

## 2022-05-31 LAB — POCT GLYCOSYLATED HEMOGLOBIN (HGB A1C): Hemoglobin A1C: 5.6 % (ref 4.0–5.6)

## 2022-05-31 LAB — POCT INR: INR: 1.4 — AB (ref 2.0–3.0)

## 2022-05-31 NOTE — Patient Instructions (Signed)
Your INR is 1.4 with a goal of 1.5-2.0  Your A1c is excellent and back in normal ranges  If you have any further bouts of chest tightness, shortness of breath, or other concerning symptoms you should go to the emergency department.  I recommend trying to stay indoors due to the air quality at this time.  Make sure and follow-up with your cardiologist at the next scheduled appointment but do not hesitate to reach out to [place] or them sooner if anything changes in the meantime.
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
## 2022-05-31 NOTE — Assessment & Plan Note (Signed)
Goal of 1.5-2.0 per cardiology.  INR today at 1.4.  He is going FYI his cardiologist.  Discussed case with Dr. Andria Frames and we are going to hold off on making changes since he is seeing his cardiologist in a few days.

## 2022-06-01 DIAGNOSIS — Z95811 Presence of heart assist device: Secondary | ICD-10-CM | POA: Diagnosis not present

## 2022-06-01 DIAGNOSIS — S065X0A Traumatic subdural hemorrhage without loss of consciousness, initial encounter: Secondary | ICD-10-CM | POA: Diagnosis not present

## 2022-06-01 DIAGNOSIS — Z7901 Long term (current) use of anticoagulants: Secondary | ICD-10-CM | POA: Diagnosis not present

## 2022-06-02 DIAGNOSIS — S065X0A Traumatic subdural hemorrhage without loss of consciousness, initial encounter: Secondary | ICD-10-CM | POA: Diagnosis not present

## 2022-06-05 ENCOUNTER — Other Ambulatory Visit (HOSPITAL_COMMUNITY): Payer: Self-pay | Admitting: *Deleted

## 2022-06-05 DIAGNOSIS — Z95811 Presence of heart assist device: Secondary | ICD-10-CM

## 2022-06-05 DIAGNOSIS — Z7901 Long term (current) use of anticoagulants: Secondary | ICD-10-CM

## 2022-06-05 DIAGNOSIS — S065X0A Traumatic subdural hemorrhage without loss of consciousness, initial encounter: Secondary | ICD-10-CM | POA: Diagnosis not present

## 2022-06-06 ENCOUNTER — Encounter (HOSPITAL_COMMUNITY): Payer: Medicaid Other

## 2022-06-06 DIAGNOSIS — S065X0A Traumatic subdural hemorrhage without loss of consciousness, initial encounter: Secondary | ICD-10-CM | POA: Diagnosis not present

## 2022-06-07 DIAGNOSIS — S065X0A Traumatic subdural hemorrhage without loss of consciousness, initial encounter: Secondary | ICD-10-CM | POA: Diagnosis not present

## 2022-06-08 DIAGNOSIS — S065X0A Traumatic subdural hemorrhage without loss of consciousness, initial encounter: Secondary | ICD-10-CM | POA: Diagnosis not present

## 2022-06-09 DIAGNOSIS — S065X0A Traumatic subdural hemorrhage without loss of consciousness, initial encounter: Secondary | ICD-10-CM | POA: Diagnosis not present

## 2022-06-12 ENCOUNTER — Other Ambulatory Visit (HOSPITAL_COMMUNITY): Payer: Self-pay | Admitting: Internal Medicine

## 2022-06-12 DIAGNOSIS — Z95811 Presence of heart assist device: Secondary | ICD-10-CM

## 2022-06-12 DIAGNOSIS — I5042 Chronic combined systolic (congestive) and diastolic (congestive) heart failure: Secondary | ICD-10-CM

## 2022-06-12 DIAGNOSIS — S065X0A Traumatic subdural hemorrhage without loss of consciousness, initial encounter: Secondary | ICD-10-CM | POA: Diagnosis not present

## 2022-06-13 DIAGNOSIS — S065X0A Traumatic subdural hemorrhage without loss of consciousness, initial encounter: Secondary | ICD-10-CM | POA: Diagnosis not present

## 2022-06-14 DIAGNOSIS — Z79899 Other long term (current) drug therapy: Secondary | ICD-10-CM | POA: Diagnosis not present

## 2022-06-14 DIAGNOSIS — I10 Essential (primary) hypertension: Secondary | ICD-10-CM | POA: Diagnosis not present

## 2022-06-14 DIAGNOSIS — I110 Hypertensive heart disease with heart failure: Secondary | ICD-10-CM | POA: Diagnosis not present

## 2022-06-14 DIAGNOSIS — I255 Ischemic cardiomyopathy: Secondary | ICD-10-CM | POA: Diagnosis not present

## 2022-06-14 DIAGNOSIS — Z95811 Presence of heart assist device: Secondary | ICD-10-CM | POA: Diagnosis not present

## 2022-06-14 DIAGNOSIS — I5023 Acute on chronic systolic (congestive) heart failure: Secondary | ICD-10-CM | POA: Diagnosis not present

## 2022-06-14 DIAGNOSIS — S065X0A Traumatic subdural hemorrhage without loss of consciousness, initial encounter: Secondary | ICD-10-CM | POA: Diagnosis not present

## 2022-06-15 DIAGNOSIS — S065X0A Traumatic subdural hemorrhage without loss of consciousness, initial encounter: Secondary | ICD-10-CM | POA: Diagnosis not present

## 2022-06-16 DIAGNOSIS — S065X0A Traumatic subdural hemorrhage without loss of consciousness, initial encounter: Secondary | ICD-10-CM | POA: Diagnosis not present

## 2022-06-19 DIAGNOSIS — S065X0A Traumatic subdural hemorrhage without loss of consciousness, initial encounter: Secondary | ICD-10-CM | POA: Diagnosis not present

## 2022-06-20 DIAGNOSIS — S065X0A Traumatic subdural hemorrhage without loss of consciousness, initial encounter: Secondary | ICD-10-CM | POA: Diagnosis not present

## 2022-06-21 DIAGNOSIS — S065X0A Traumatic subdural hemorrhage without loss of consciousness, initial encounter: Secondary | ICD-10-CM | POA: Diagnosis not present

## 2022-06-22 DIAGNOSIS — S065X0A Traumatic subdural hemorrhage without loss of consciousness, initial encounter: Secondary | ICD-10-CM | POA: Diagnosis not present

## 2022-06-22 DIAGNOSIS — M79661 Pain in right lower leg: Secondary | ICD-10-CM | POA: Diagnosis not present

## 2022-06-22 DIAGNOSIS — G894 Chronic pain syndrome: Secondary | ICD-10-CM | POA: Diagnosis not present

## 2022-06-22 DIAGNOSIS — M25552 Pain in left hip: Secondary | ICD-10-CM | POA: Diagnosis not present

## 2022-06-22 DIAGNOSIS — M79662 Pain in left lower leg: Secondary | ICD-10-CM | POA: Diagnosis not present

## 2022-06-22 DIAGNOSIS — M545 Low back pain, unspecified: Secondary | ICD-10-CM | POA: Diagnosis not present

## 2022-06-22 DIAGNOSIS — M25551 Pain in right hip: Secondary | ICD-10-CM | POA: Diagnosis not present

## 2022-06-22 DIAGNOSIS — M5137 Other intervertebral disc degeneration, lumbosacral region: Secondary | ICD-10-CM | POA: Diagnosis not present

## 2022-06-22 DIAGNOSIS — G890 Central pain syndrome: Secondary | ICD-10-CM | POA: Diagnosis not present

## 2022-06-22 DIAGNOSIS — M542 Cervicalgia: Secondary | ICD-10-CM | POA: Diagnosis not present

## 2022-06-23 DIAGNOSIS — S065X0A Traumatic subdural hemorrhage without loss of consciousness, initial encounter: Secondary | ICD-10-CM | POA: Diagnosis not present

## 2022-06-24 ENCOUNTER — Other Ambulatory Visit (HOSPITAL_COMMUNITY): Payer: Self-pay | Admitting: Internal Medicine

## 2022-06-24 DIAGNOSIS — Z95811 Presence of heart assist device: Secondary | ICD-10-CM

## 2022-06-24 DIAGNOSIS — Z419 Encounter for procedure for purposes other than remedying health state, unspecified: Secondary | ICD-10-CM | POA: Diagnosis not present

## 2022-06-24 DIAGNOSIS — I5042 Chronic combined systolic (congestive) and diastolic (congestive) heart failure: Secondary | ICD-10-CM

## 2022-06-26 DIAGNOSIS — S065X0A Traumatic subdural hemorrhage without loss of consciousness, initial encounter: Secondary | ICD-10-CM | POA: Diagnosis not present

## 2022-06-28 DIAGNOSIS — S065X0A Traumatic subdural hemorrhage without loss of consciousness, initial encounter: Secondary | ICD-10-CM | POA: Diagnosis not present

## 2022-06-28 DIAGNOSIS — Z95811 Presence of heart assist device: Secondary | ICD-10-CM | POA: Diagnosis not present

## 2022-06-28 DIAGNOSIS — Z7901 Long term (current) use of anticoagulants: Secondary | ICD-10-CM | POA: Diagnosis not present

## 2022-06-29 ENCOUNTER — Other Ambulatory Visit (HOSPITAL_COMMUNITY): Payer: Self-pay | Admitting: Internal Medicine

## 2022-06-29 DIAGNOSIS — Z95811 Presence of heart assist device: Secondary | ICD-10-CM

## 2022-06-29 DIAGNOSIS — I5042 Chronic combined systolic (congestive) and diastolic (congestive) heart failure: Secondary | ICD-10-CM

## 2022-06-29 DIAGNOSIS — S065X0A Traumatic subdural hemorrhage without loss of consciousness, initial encounter: Secondary | ICD-10-CM | POA: Diagnosis not present

## 2022-06-30 DIAGNOSIS — S065X0A Traumatic subdural hemorrhage without loss of consciousness, initial encounter: Secondary | ICD-10-CM | POA: Diagnosis not present

## 2022-07-02 IMAGING — DX DG HAND COMPLETE 3+V*R*
3 series · 3 of 3 positions shown · non-contrast
Comparison: None.

CLINICAL DATA: 61-year-old male status post fall on blood thinners.

EXAM:
RIGHT HAND - COMPLETE 3+ VIEW

[hand ap]
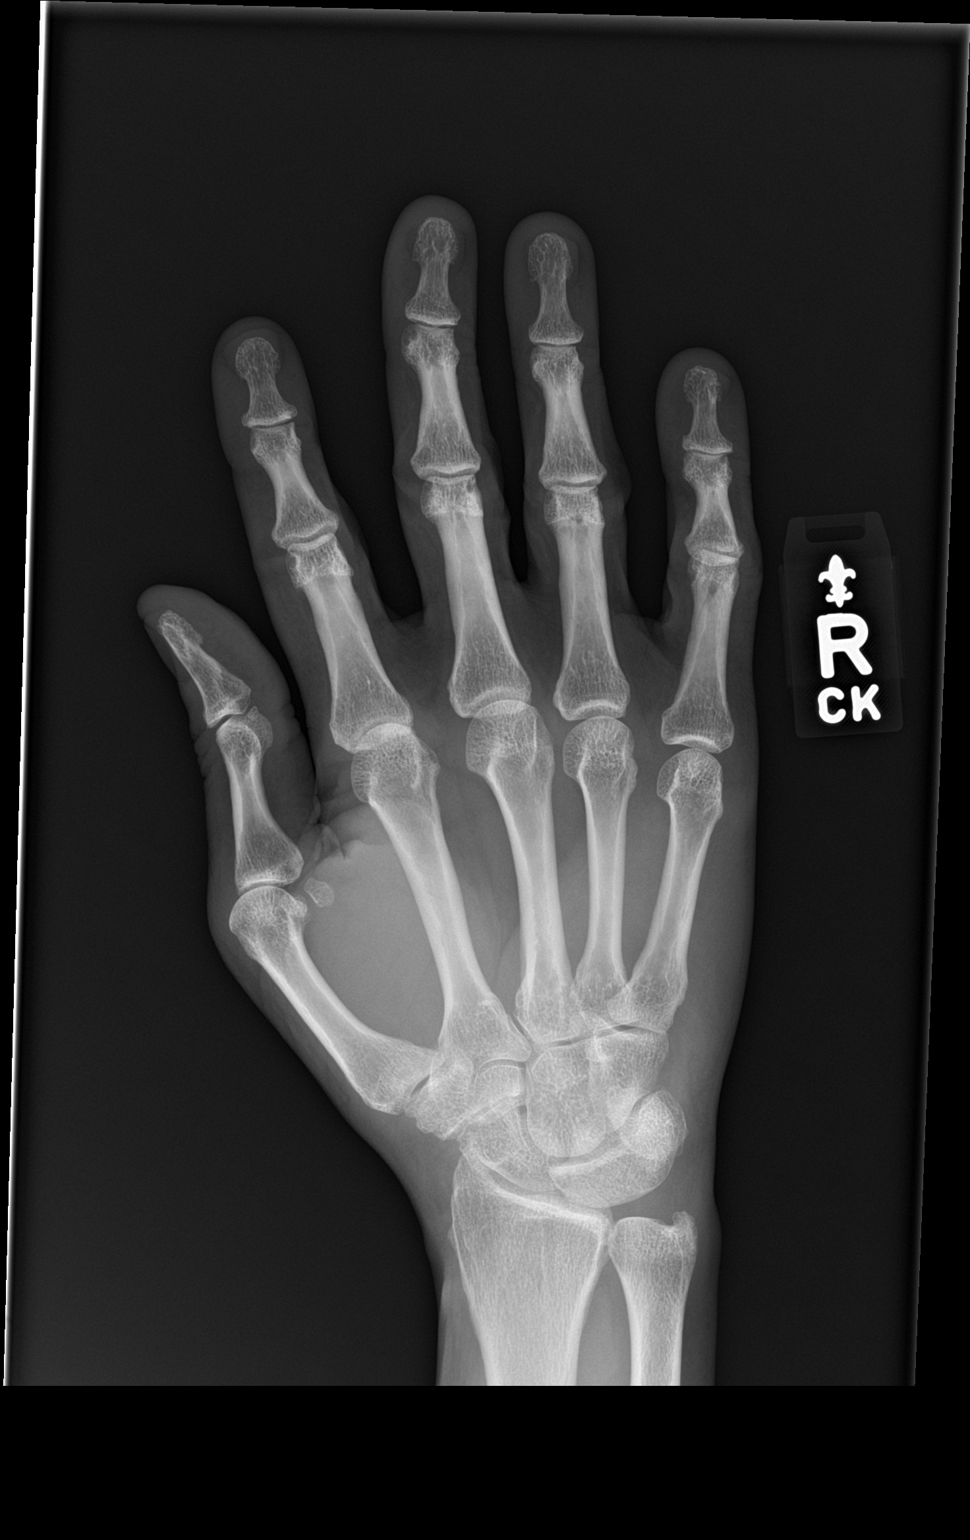

[hand obl]
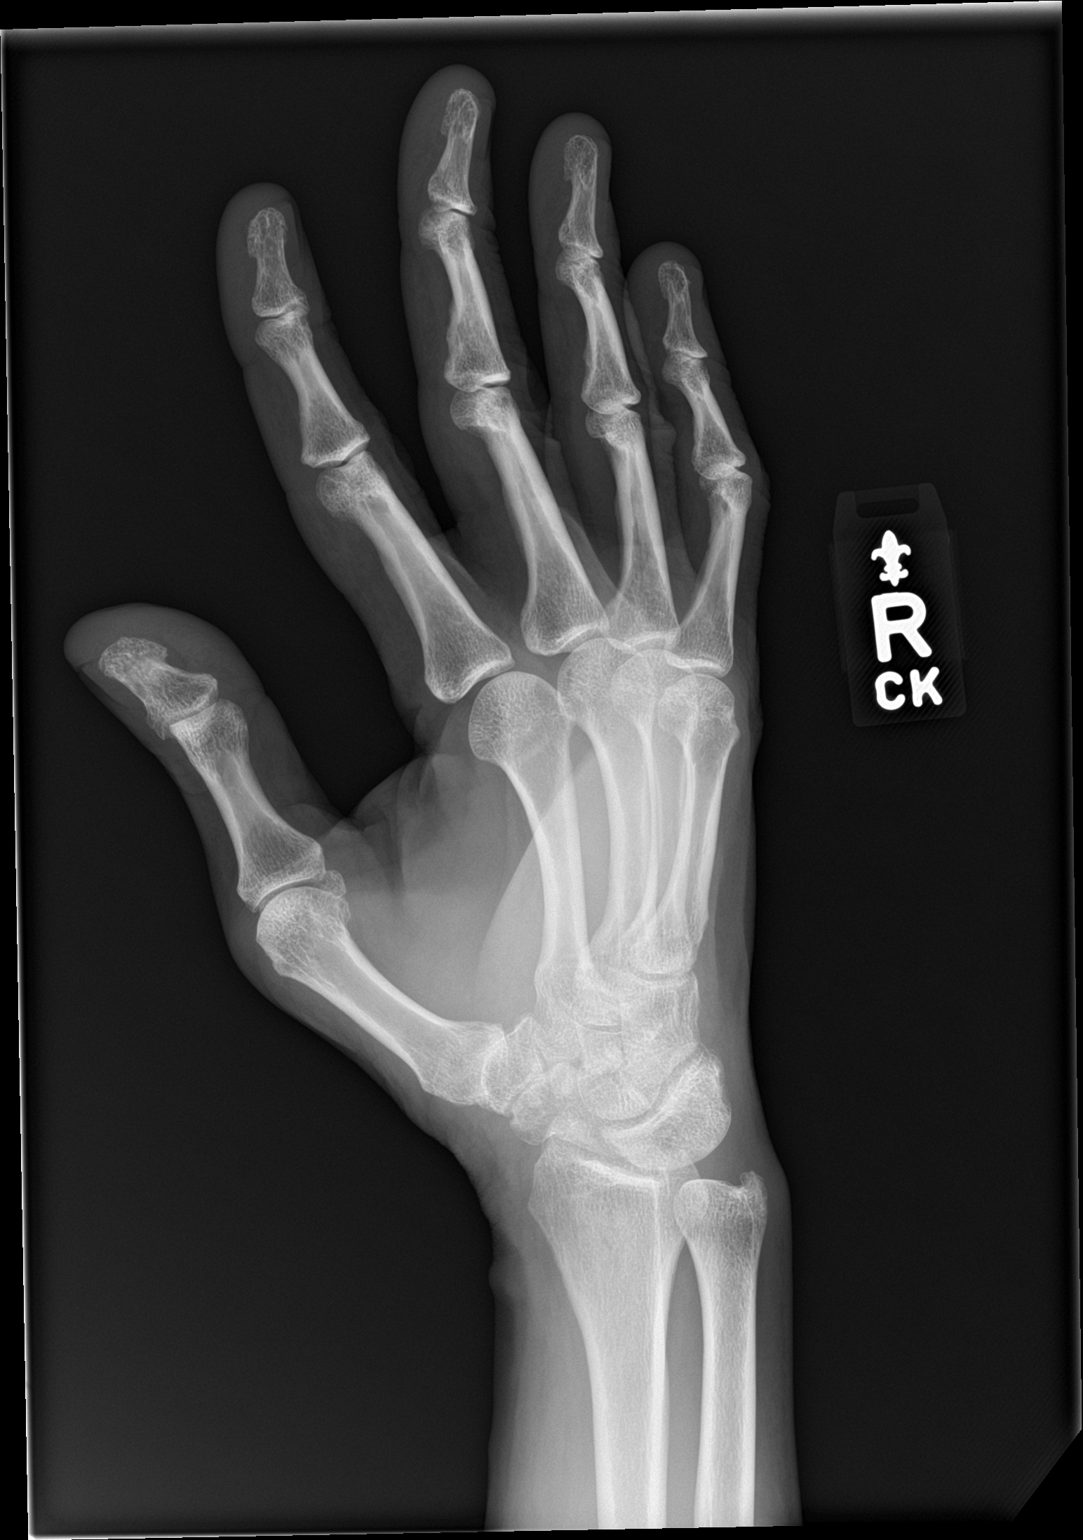

[hand lat]
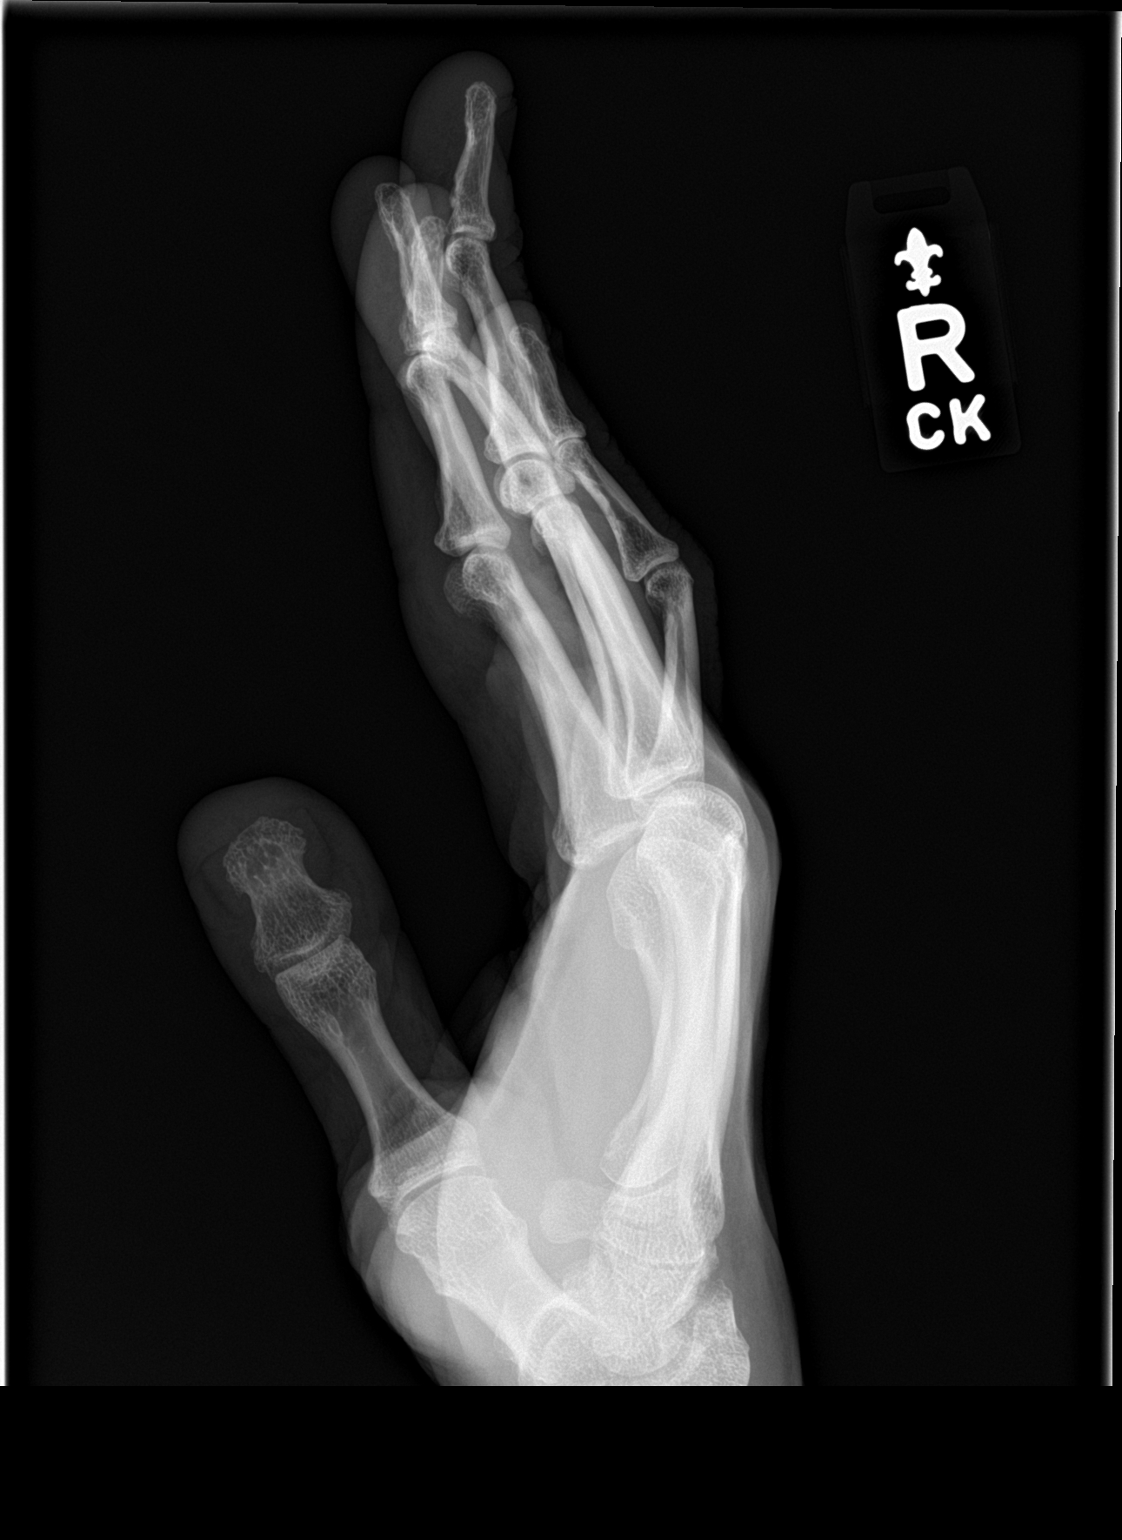

[3 of 3 positions shown; findings below may reference images not displayed]

FINDINGS: Bone mineralization is within normal limits. There is no evidence of
fracture or dislocation. There is no evidence of arthropathy or
other focal bone abnormality. No discrete soft tissue injury.
IMPRESSION: Negative.

## 2022-07-02 IMAGING — DX DG HAND COMPLETE 3+V*L*
3 series · 3 of 3 positions shown · non-contrast
Comparison: Left wrist series today.

CLINICAL DATA: 61-year-old male status post fall on blood thinners.

EXAM:
LEFT HAND - COMPLETE 3+ VIEW

[hand ap]
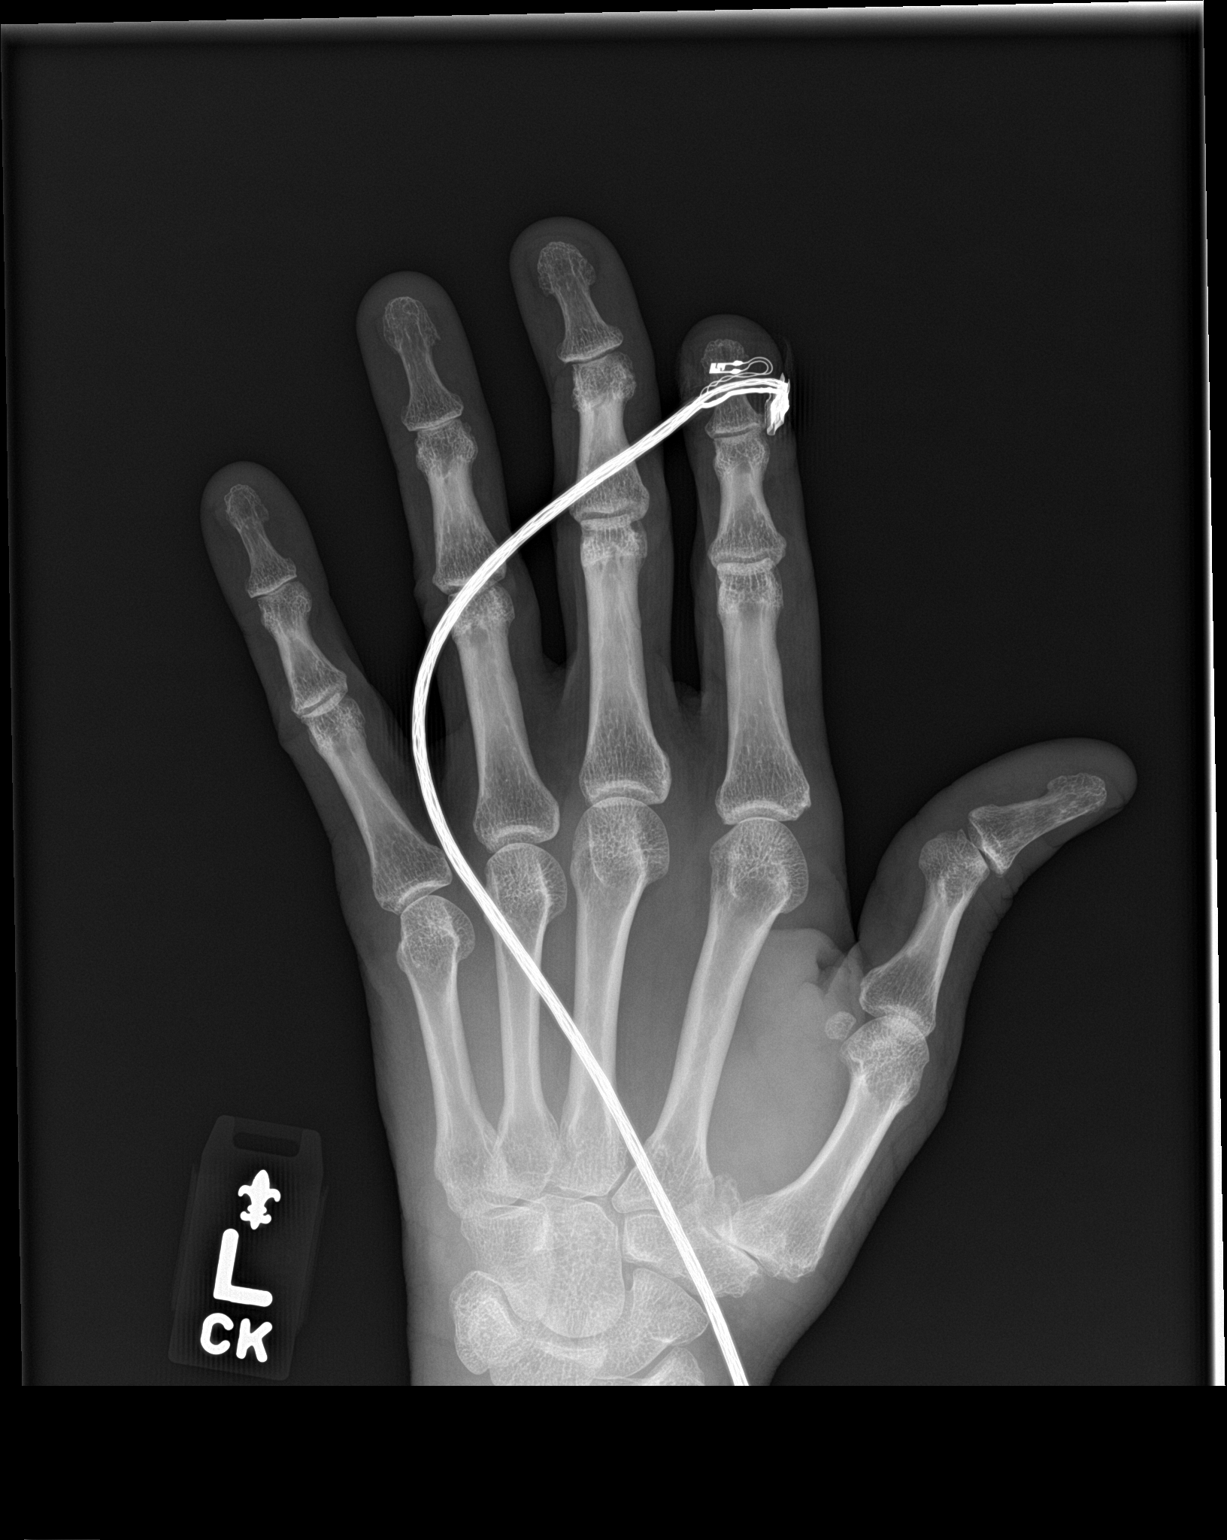

[hand obl]
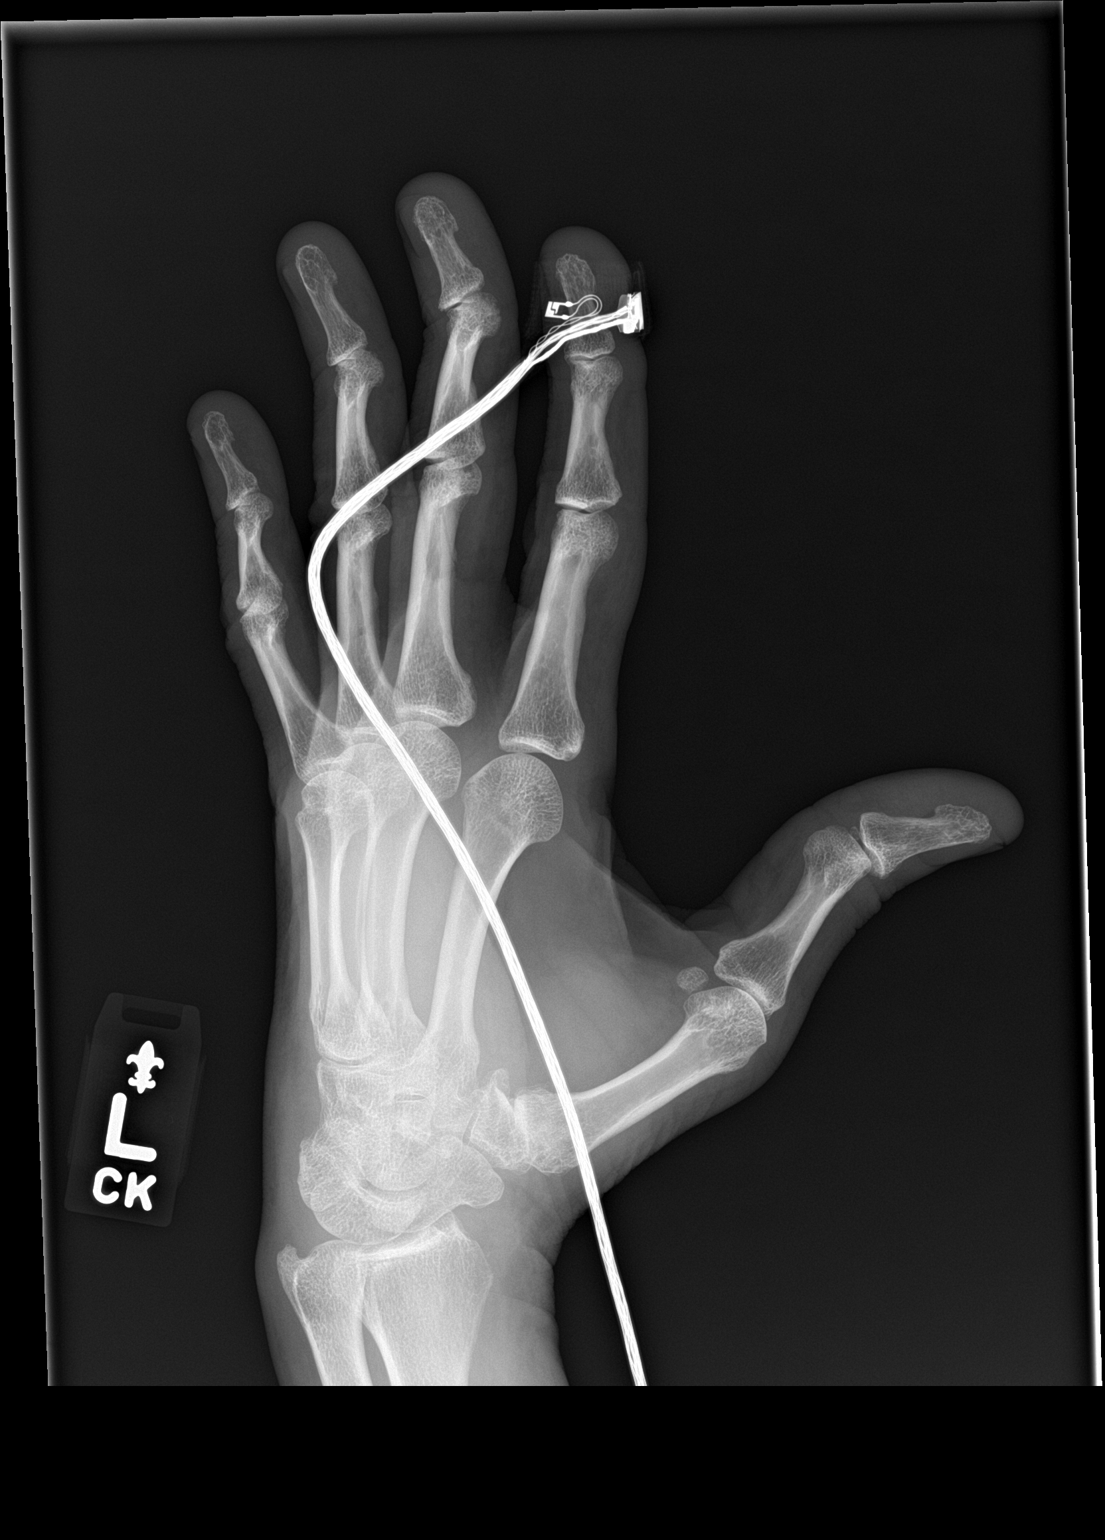

[hand lat]
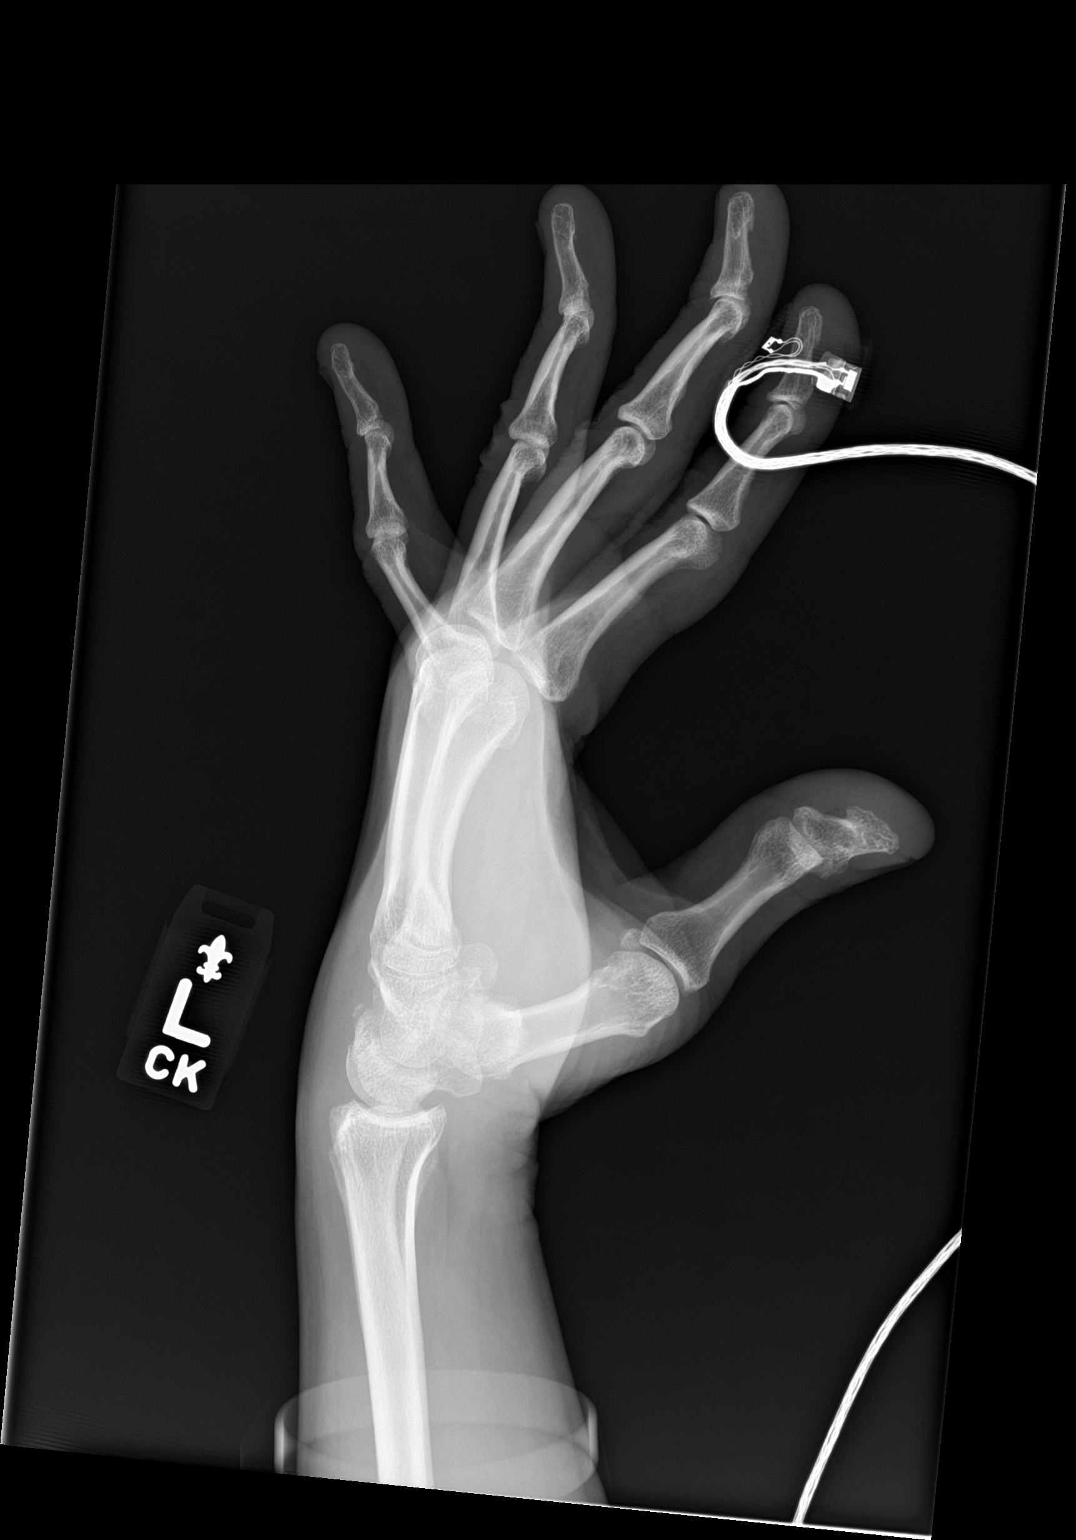

[3 of 3 positions shown; findings below may reference images not displayed]

FINDINGS: Left index finger pulse oximeter artifact. Bone mineralization is
within normal limits. Small chronic ossific fragment at the dorsal
carpal bones as seen on the wrist series today. Distal radius and
ulna appear intact. First CMC joint space loss, osteoarthritis.
Metacarpals and phalanges appear intact.
IMPRESSION: 1. No acute fracture or dislocation identified about the left hand.
First CMC osteoarthritis.
2. Possible triquetrum fracture as on the wrist series.

## 2022-07-02 IMAGING — CT CT CERVICAL SPINE W/O CM
3 of 4 series · 12 of 33 positions shown, 14 images · non-contrast
Comparison: None.

09/02/2021

CLINICAL DATA: Status post fall



[Series 3: c_spine 2.0 st · axial · 0.36mm/px · z∈[-177,-65]mm · 4 of 84 slices shown, 5 images]
[im 14/84  soft-tissue]
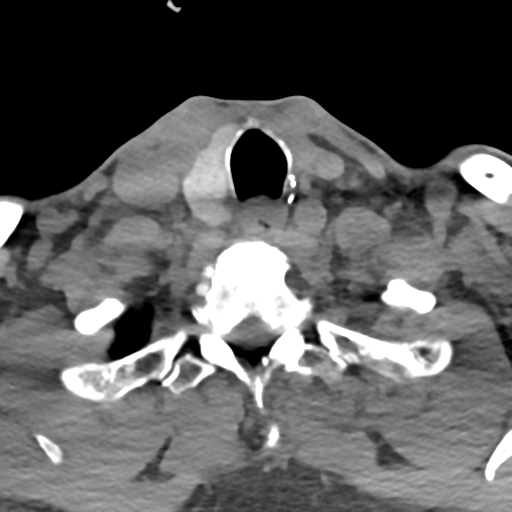
[im 14/84  bone]
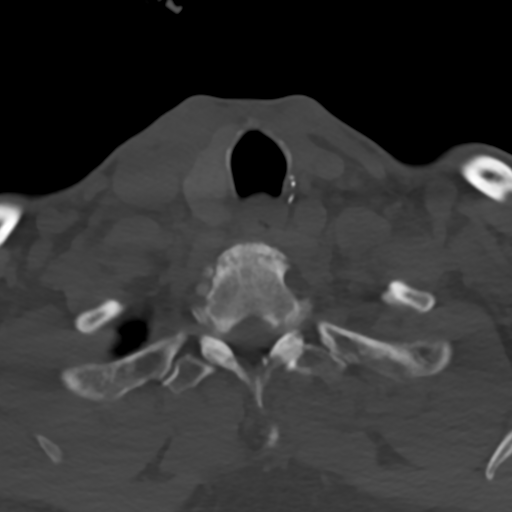
[im 28/84  bone]
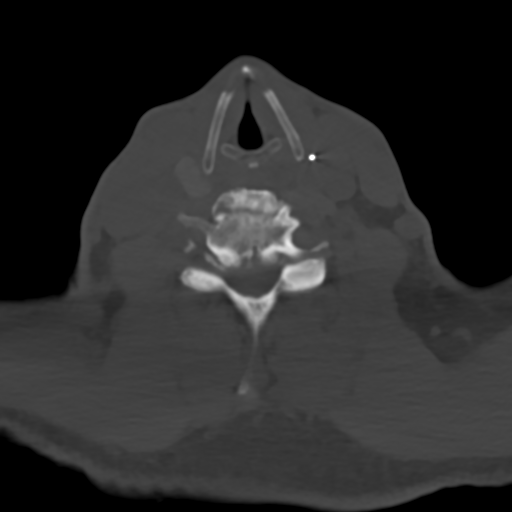
[im 56/84  bone]
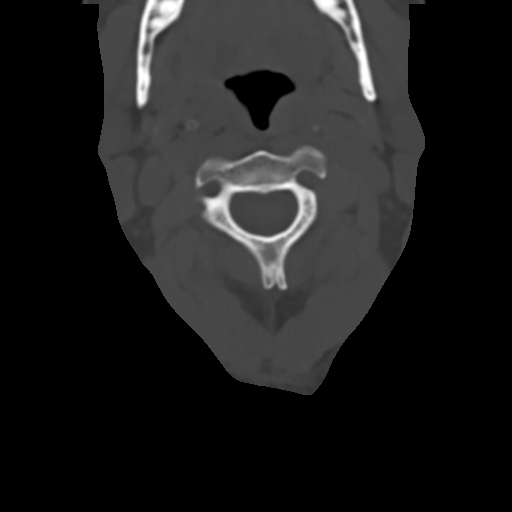
[im 70/84  bone]
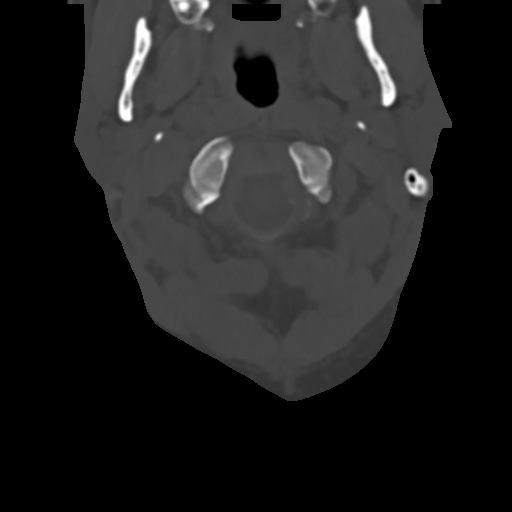

[Series 6: c_spine 2.0 sag bone · sagittal · 0.29mm/px · 5 of 71 slices shown, 6 images]
[im 24/71  bone]
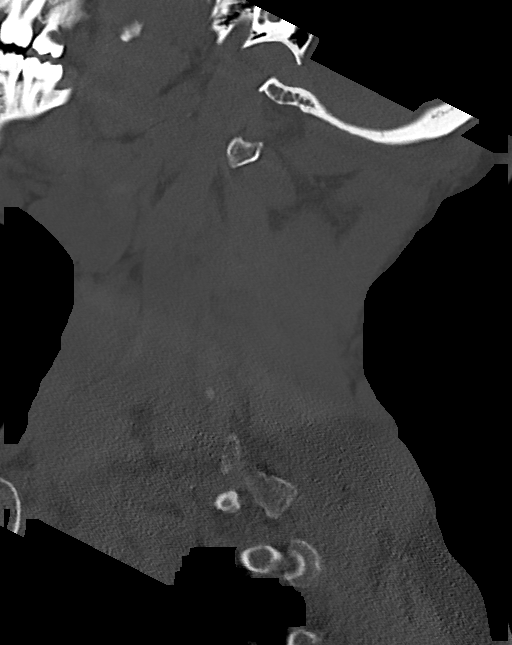
[im 30/71  bone]
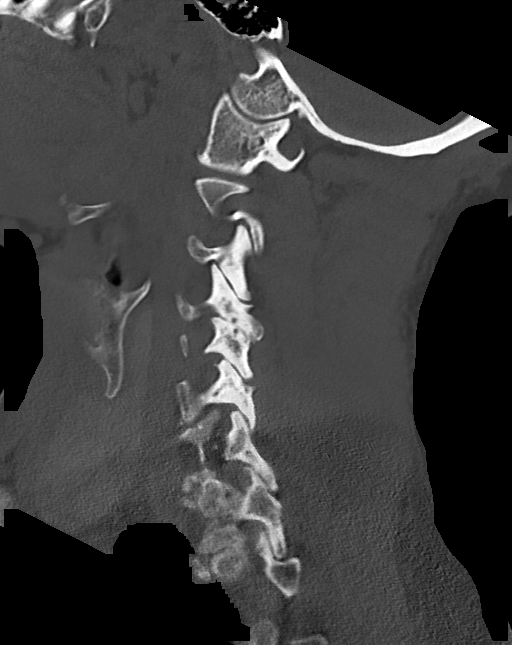
[im 36/71  soft-tissue]
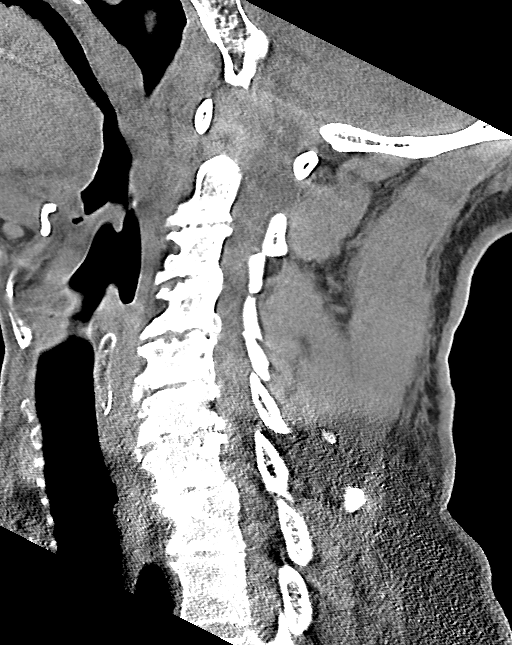
[im 36/71  bone]
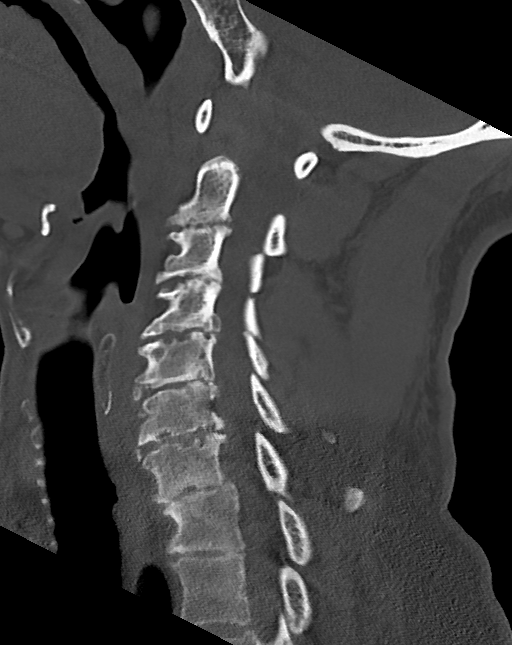
[im 41/71  bone]
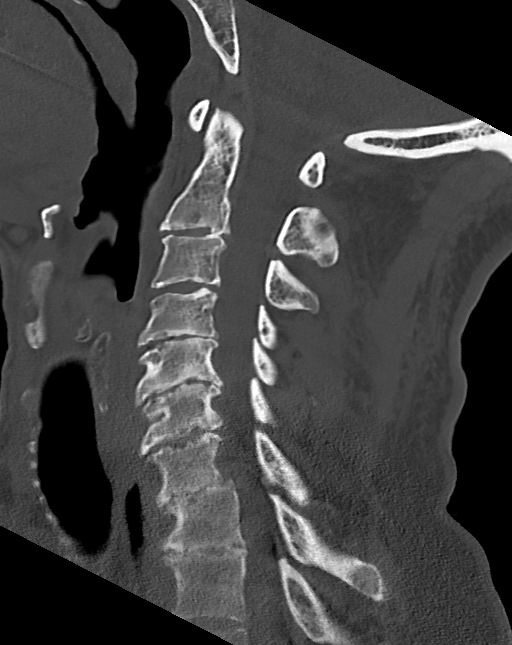
[im 47/71  bone]
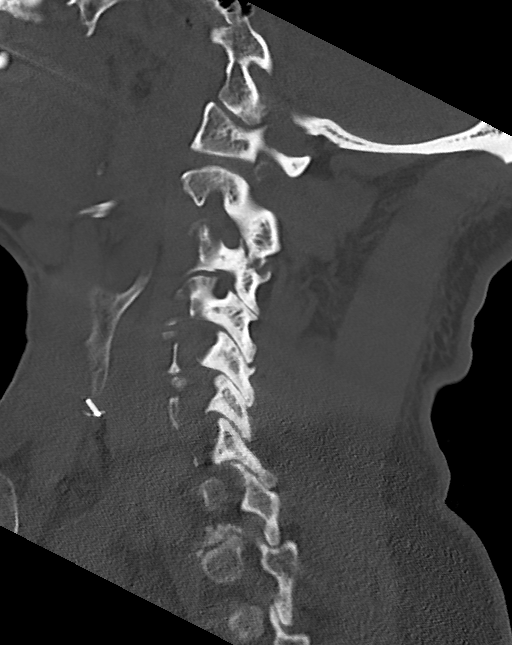

[Series 7: c_spine 2.0 cor bone · coronal · 0.25mm/px · 3 of 61 slices shown]
[im 13/61  bone]
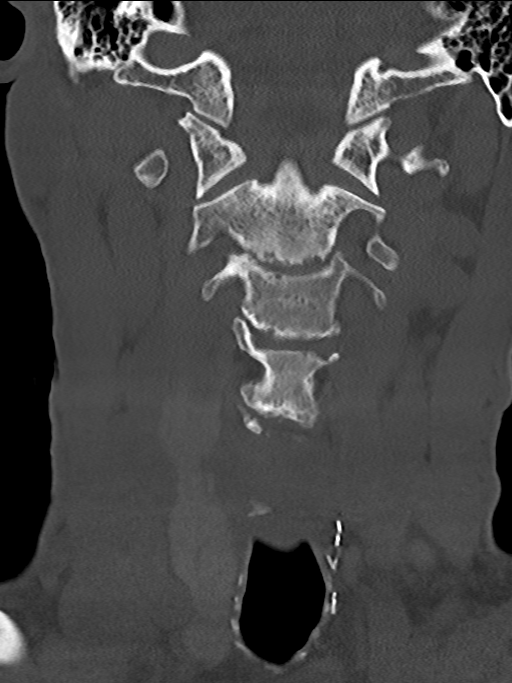
[im 25/61  bone]
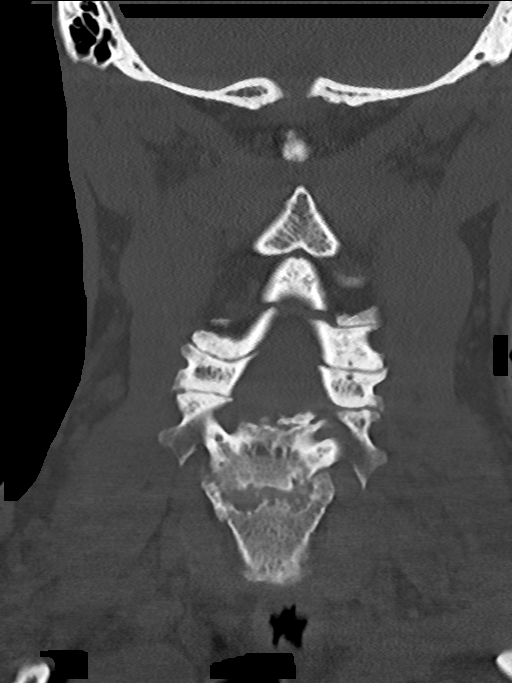
[im 37/61  bone]
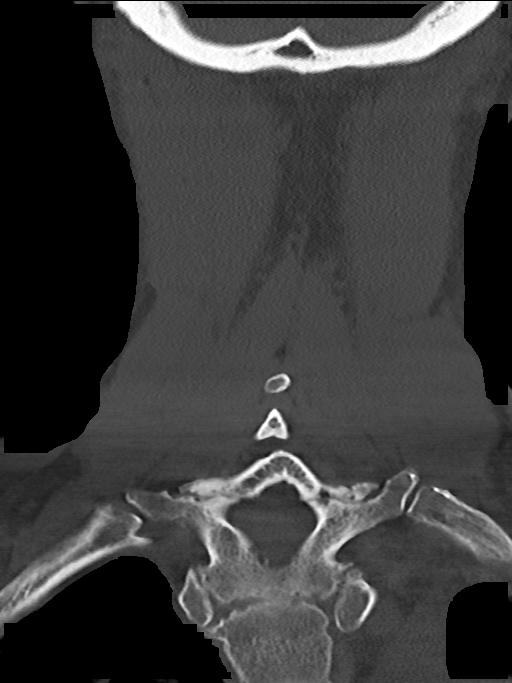

[12 of 33 positions shown; findings below may reference images not displayed]

FINDINGS: CT HEAD FINDINGS

Brain: There is a large acute right subdural hematoma which overlies
the inferior right frontal lobe and anterior right temporal lobe.
This measures 1.3 cm in thickness measured along the undersurface of
the inferior right frontal lobe, image [DATE]. There is associated
mass effect upon the underlying cerebral cortex with effacement of
the sulci. 2-3 mm of right to left midline shift noted.

Para falcine subdural hematoma is also identified. This measures
approximately 6 mm in thickness, image [DATE].

Mild hemorrhagic contusion noted over the right inferior frontal
gyrus, is suspected.

The ventricles are normal in volume. No intraventricular hemorrhage
identified.

There is mild diffuse low-attenuation within the subcortical and
periventricular white matter compatible with chronic microvascular
disease.

Remote lacunar infarct in the right cerebellar hemisphere is again
noted.

Vascular: No hyperdense vessel or unexpected calcification.

Skull: Normal. Negative for fracture or focal lesion.

Other: None

CT MAXILLOFACIAL FINDINGS

Osseous: No fracture or mandibular dislocation. No destructive
process.

Orbits: Negative. No traumatic or inflammatory finding.

Sinuses: Clear.

Soft tissues: Negative.

CT CERVICAL SPINE FINDINGS

Alignment: There is no signs of acute posttraumatic malalignment of
the cervical spine. Anterolisthesis of C7 on T1 measures 3 mm and is
likely related to chronic spondylosis.

Skull base and vertebrae: No acute fracture. No primary bone lesion
or focal pathologic process.

Soft tissues and spinal canal: No prevertebral fluid or swelling. No
visible canal hematoma.

Disc levels: Marked multilevel disc space narrowing with endplate
spurring is identified from C2-3 through C7-T1.

Upper chest: There is pleural thickening along the medial aspect of
the left apex which is only partially visualized but appears similar
to 12/10/2021. On the scout radiograph signs of stent graft repair
of thoracic aortic aneurysm noted.

Other: None
IMPRESSION: 1. Large acute right subdural hematoma overlying the inferior right
frontal lobe and anterior right temporal lobe with associated mass
effect upon the underlying cerebral cortex and 2-3 mm of right to
left midline shift.
2. Para falcine subdural hematoma is also identified.
3. Mild hemorrhagic contusion over the right inferior frontal gyrus.
4. No evidence for facial bone fracture or dislocation.
5. No evidence for acute cervical spine fracture or subluxation.
6. Marked multilevel degenerative disc disease within the cervical
spine.

Critical Value/emergent results were called by telephone at the time
of interpretation on 02/12/2022 at [DATE] to provider DUCKENSON
MINY , who verbally acknowledged these results.

## 2022-07-02 IMAGING — CT CT MAXILLOFACIAL W/O CM
3 of 4 series · 13 of 47 positions shown, 15 images · non-contrast
Comparison: None.

09/02/2021

CLINICAL DATA: Status post fall



[Series 3: facialbone 2.0 st · axial · 0.36mm/px · z∈[-133,+13]mm · 8 of 85 slices shown, 10 images]
[im 6/85  brain]
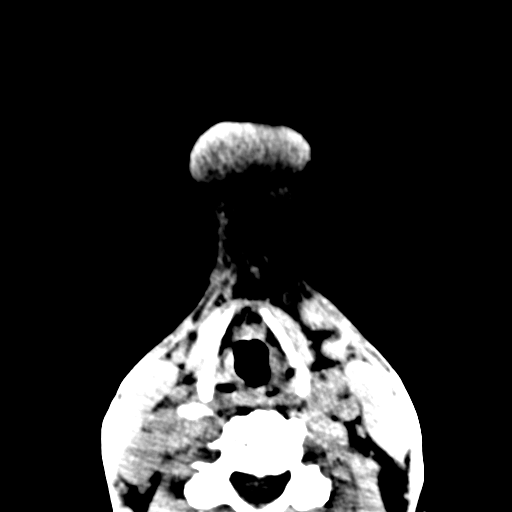
[im 6/85  bone]
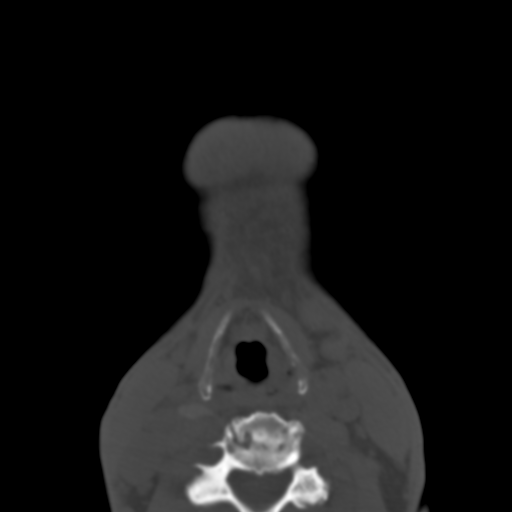
[im 18/85  bone]
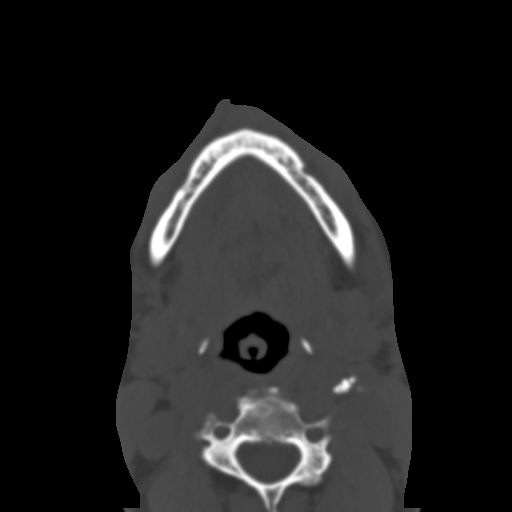
[im 27/85  bone]
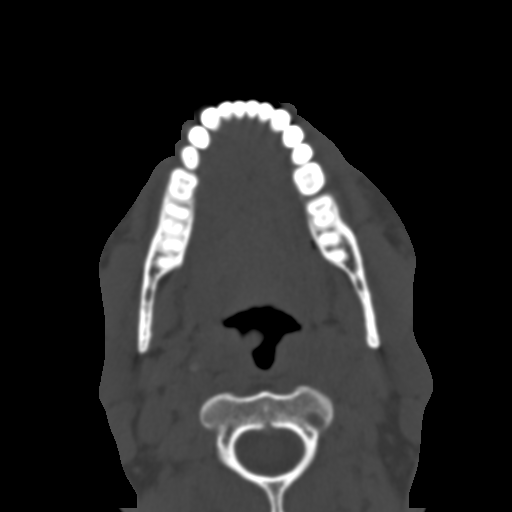
[im 38/85  bone]
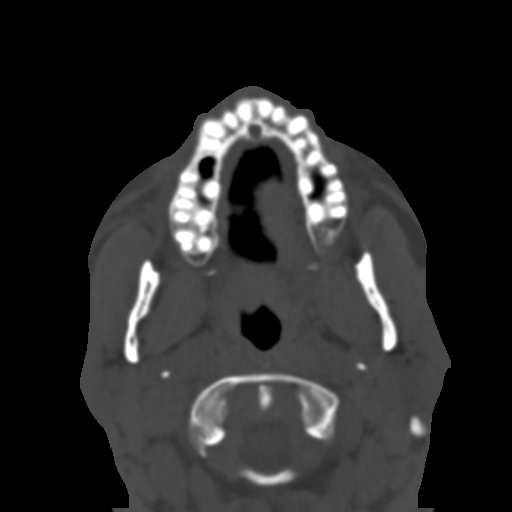
[im 47/85  brain]
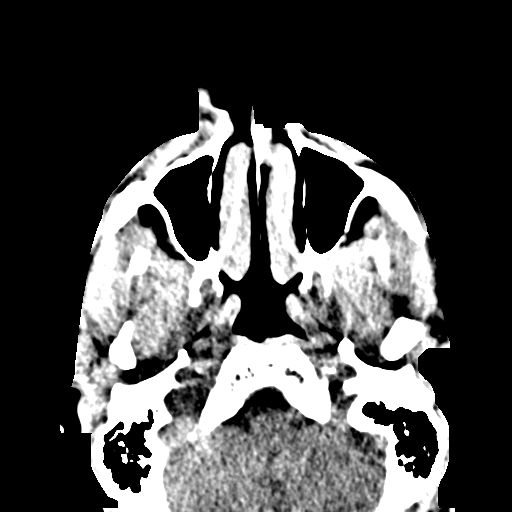
[im 47/85  bone]
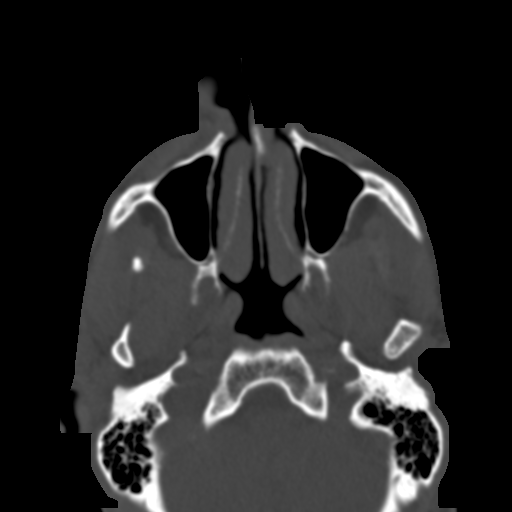
[im 58/85  bone]
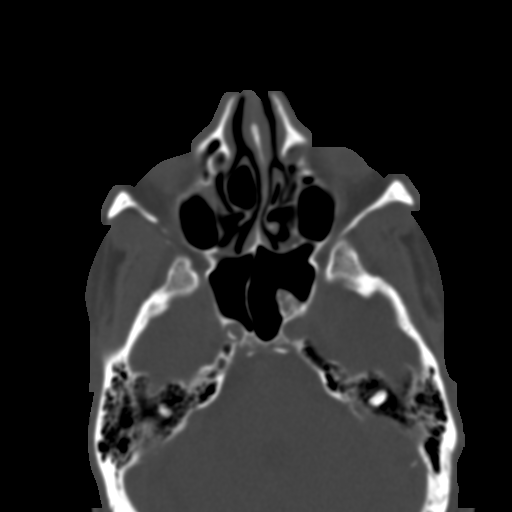
[im 67/85  bone]
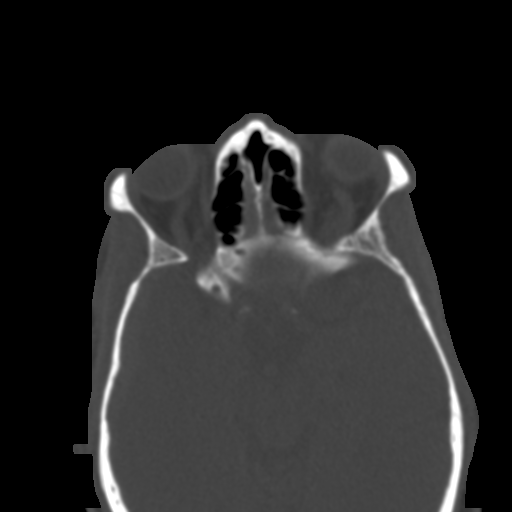
[im 79/85  bone]
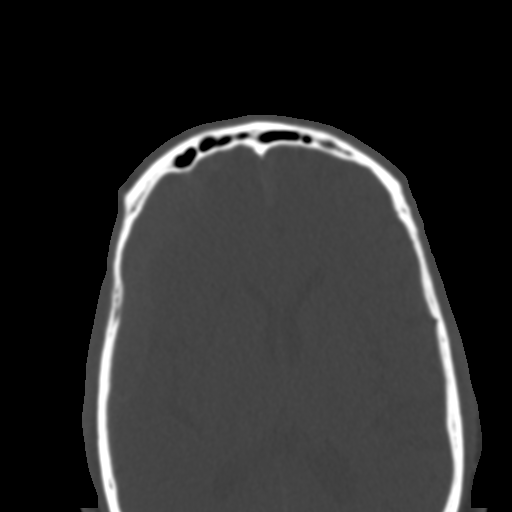

[Series 6: facialbone 2.0 cor st · coronal · 0.31mm/px · 3 of 92 slices shown]
[im 31/92  bone]
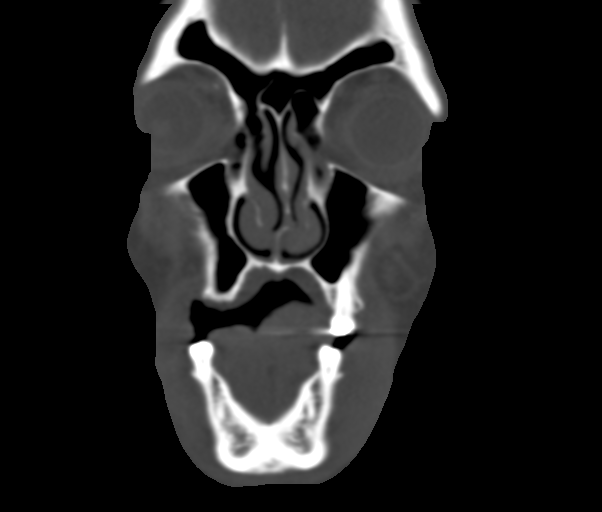
[im 41/92  bone]
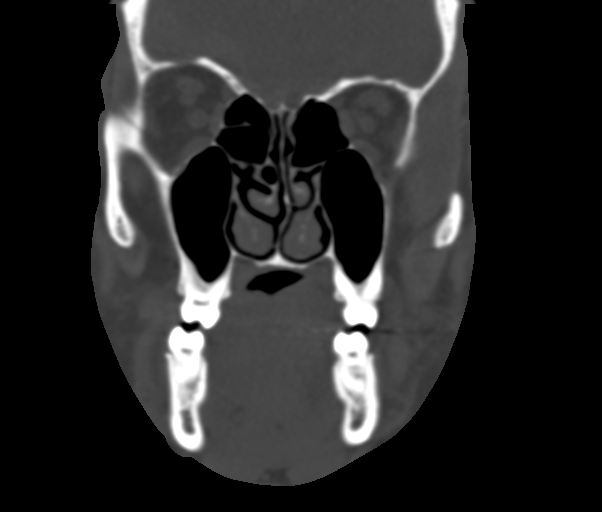
[im 51/92  bone]
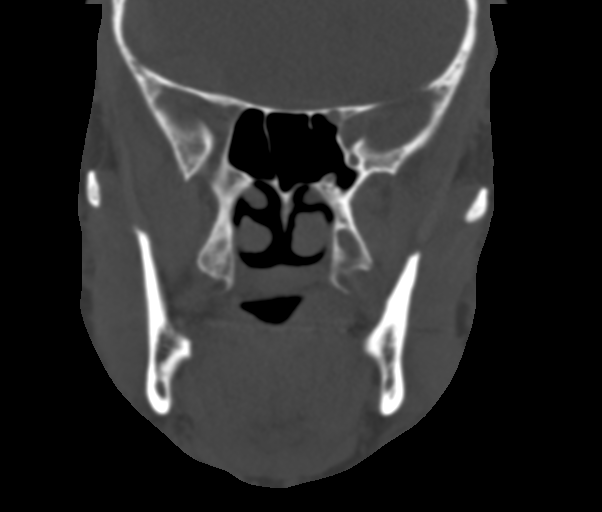

[Series 7: facialbone 2.0 sag st · sagittal · 0.34mm/px · 2 of 85 slices shown]
[im 29/85  bone]
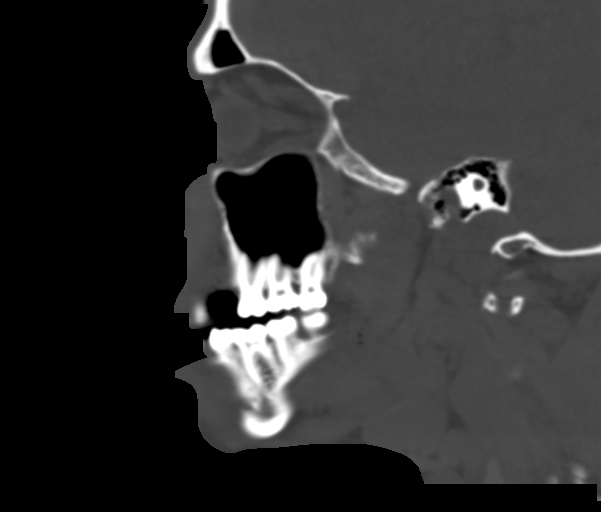
[im 57/85  bone]
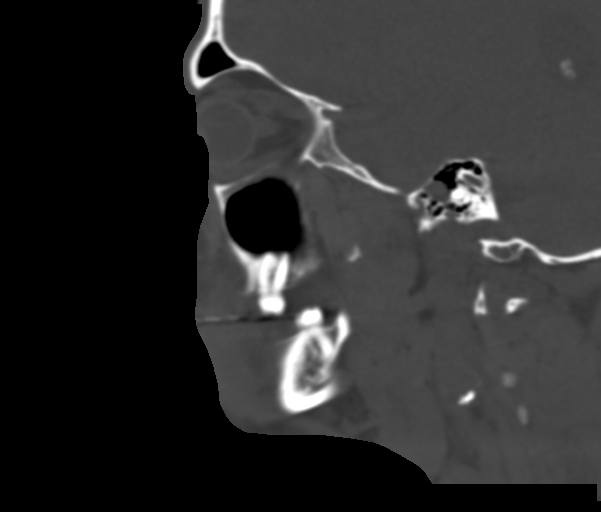

[13 of 47 positions shown; findings below may reference images not displayed]

FINDINGS: CT HEAD FINDINGS

Brain: There is a large acute right subdural hematoma which overlies
the inferior right frontal lobe and anterior right temporal lobe.
This measures 1.3 cm in thickness measured along the undersurface of
the inferior right frontal lobe, image [DATE]. There is associated
mass effect upon the underlying cerebral cortex with effacement of
the sulci. 2-3 mm of right to left midline shift noted.

Para falcine subdural hematoma is also identified. This measures
approximately 6 mm in thickness, image [DATE].

Mild hemorrhagic contusion noted over the right inferior frontal
gyrus, is suspected.

The ventricles are normal in volume. No intraventricular hemorrhage
identified.

There is mild diffuse low-attenuation within the subcortical and
periventricular white matter compatible with chronic microvascular
disease.

Remote lacunar infarct in the right cerebellar hemisphere is again
noted.

Vascular: No hyperdense vessel or unexpected calcification.

Skull: Normal. Negative for fracture or focal lesion.

Other: None

CT MAXILLOFACIAL FINDINGS

Osseous: No fracture or mandibular dislocation. No destructive
process.

Orbits: Negative. No traumatic or inflammatory finding.

Sinuses: Clear.

Soft tissues: Negative.

CT CERVICAL SPINE FINDINGS

Alignment: There is no signs of acute posttraumatic malalignment of
the cervical spine. Anterolisthesis of C7 on T1 measures 3 mm and is
likely related to chronic spondylosis.

Skull base and vertebrae: No acute fracture. No primary bone lesion
or focal pathologic process.

Soft tissues and spinal canal: No prevertebral fluid or swelling. No
visible canal hematoma.

Disc levels: Marked multilevel disc space narrowing with endplate
spurring is identified from C2-3 through C7-T1.

Upper chest: There is pleural thickening along the medial aspect of
the left apex which is only partially visualized but appears similar
to 12/10/2021. On the scout radiograph signs of stent graft repair
of thoracic aortic aneurysm noted.

Other: None
IMPRESSION: 1. Large acute right subdural hematoma overlying the inferior right
frontal lobe and anterior right temporal lobe with associated mass
effect upon the underlying cerebral cortex and 2-3 mm of right to
left midline shift.
2. Para falcine subdural hematoma is also identified.
3. Mild hemorrhagic contusion over the right inferior frontal gyrus.
4. No evidence for facial bone fracture or dislocation.
5. No evidence for acute cervical spine fracture or subluxation.
6. Marked multilevel degenerative disc disease within the cervical
spine.

Critical Value/emergent results were called by telephone at the time
of interpretation on 02/12/2022 at [DATE] to provider DUCKENSON
MINY , who verbally acknowledged these results.

## 2022-07-02 IMAGING — CT CT HEAD W/O CM
3 series · 15 of 37 positions shown, 17 images · non-contrast
Comparison: None.

09/02/2021

CLINICAL DATA: Status post fall



[Series 3: head without · axial · non-contrast · 0.46mm/px · z∈[-56,+69]mm · 7 of 35 slices shown, 9 images]
[im 5/35  brain]
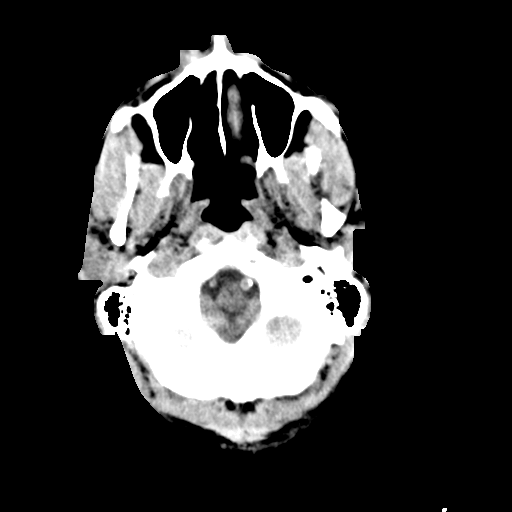
[im 5/35  bone]
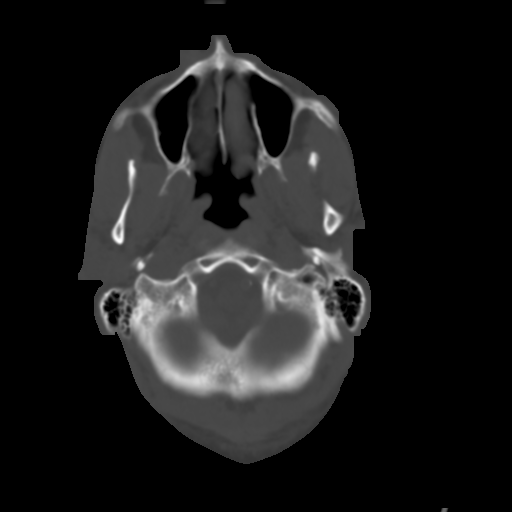
[im 9/35  brain]
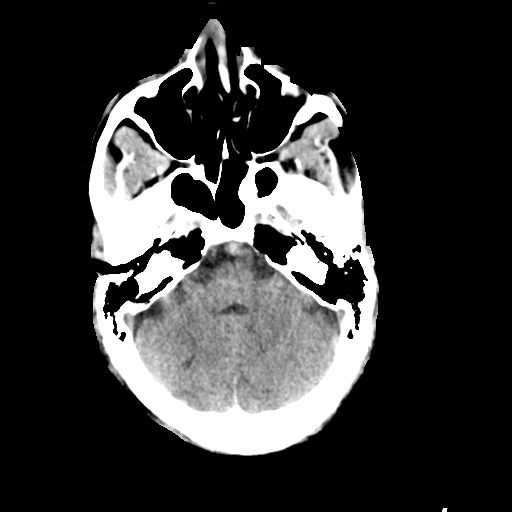
[im 13/35  brain]
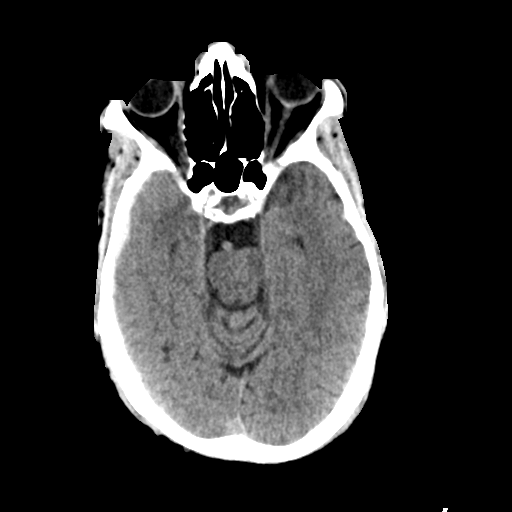
[im 18/35  brain]
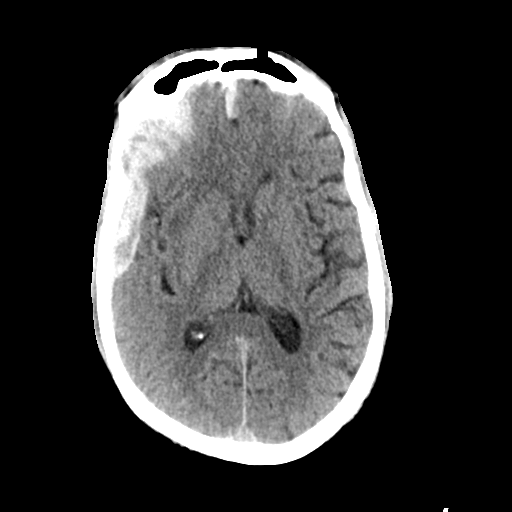
[im 22/35  brain]
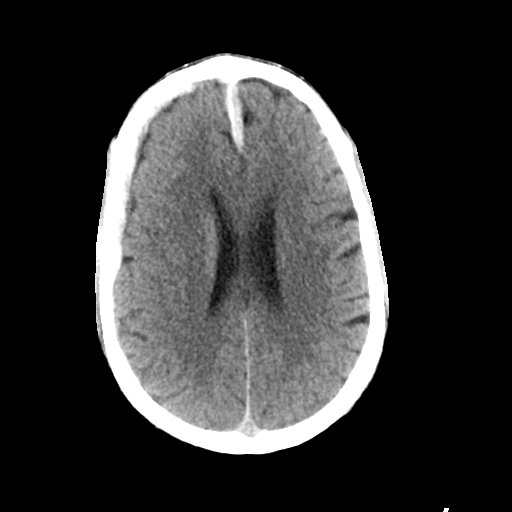
[im 22/35  bone]
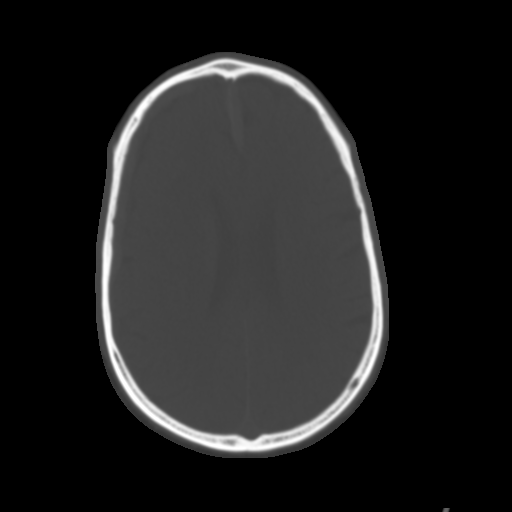
[im 26/35  brain]
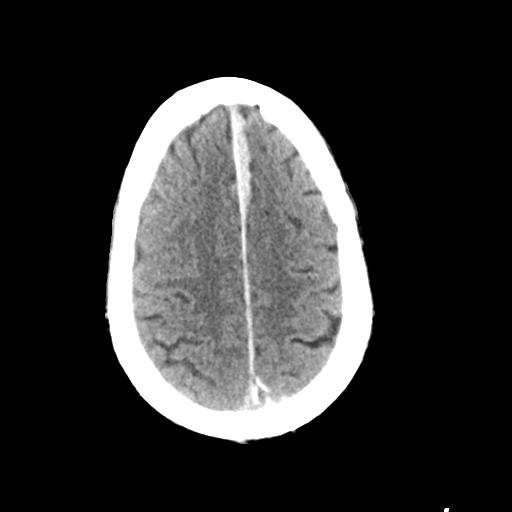
[im 30/35  brain]
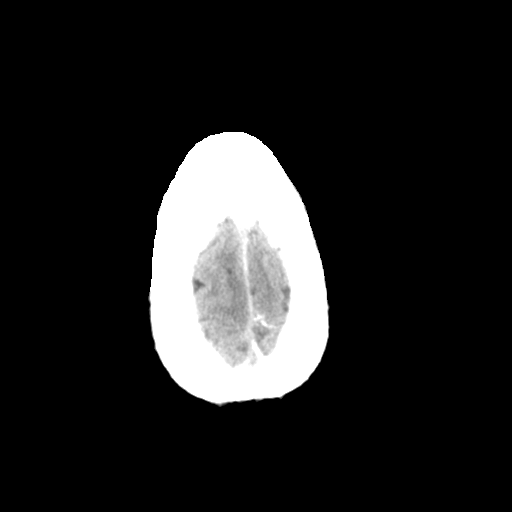

[Series 4: head bone · axial · 0.46mm/px · z∈[-60,+18]mm · 5 of 87 slices shown]
[im 9/87  bone]
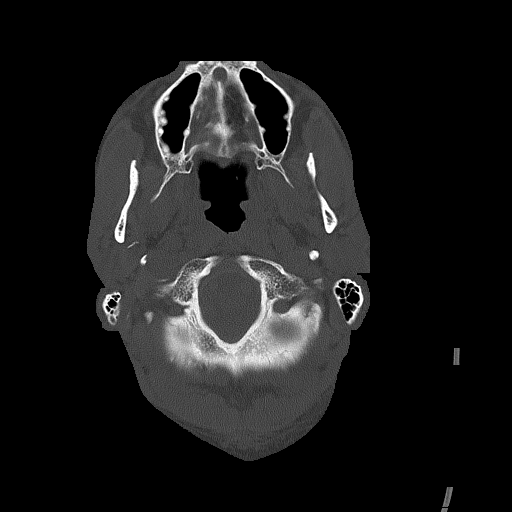
[im 18/87  bone]
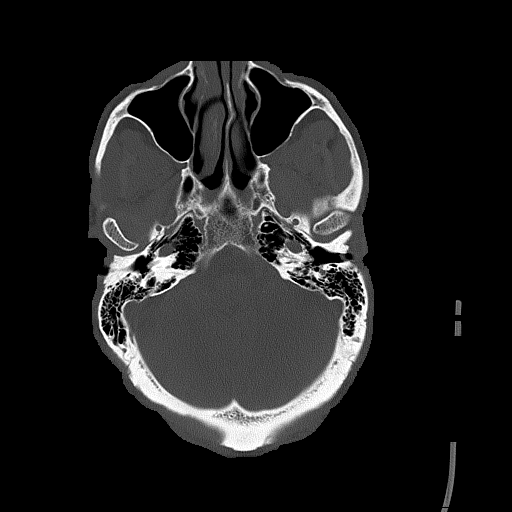
[im 26/87  bone]
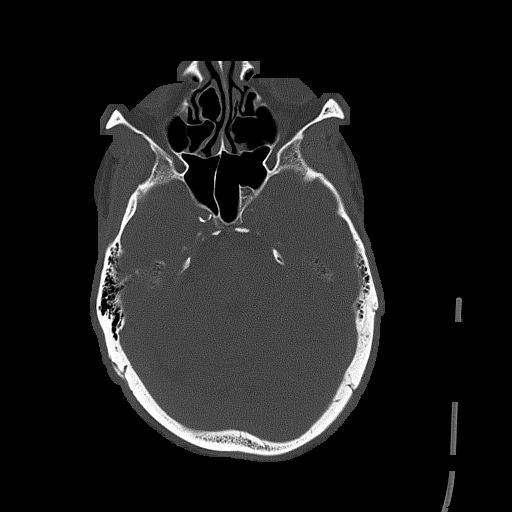
[im 39/87  bone]
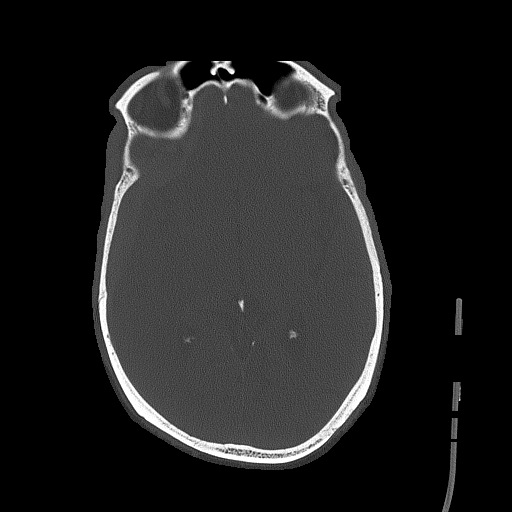
[im 48/87  bone]
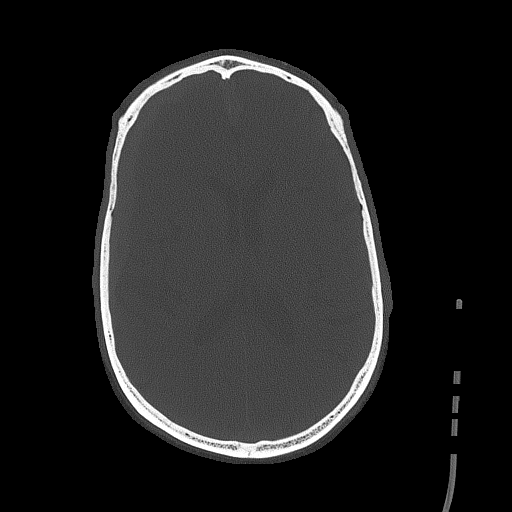

[Series 6: head without sag · sagittal · non-contrast · 0.34mm/px · 3 of 64 slices shown]
[im 22/64  brain]
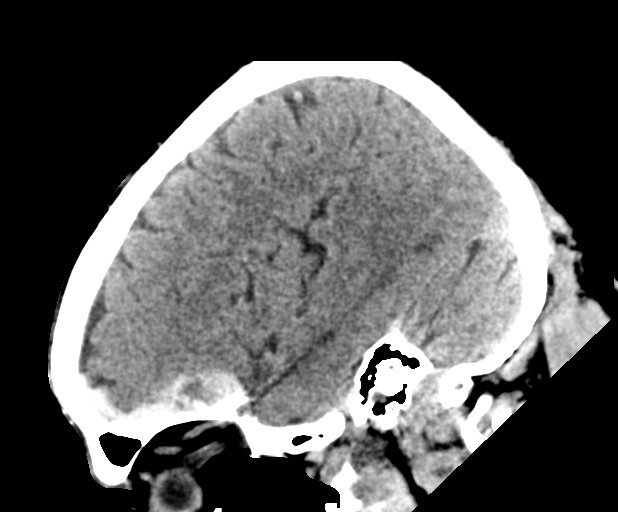
[im 32/64  brain]
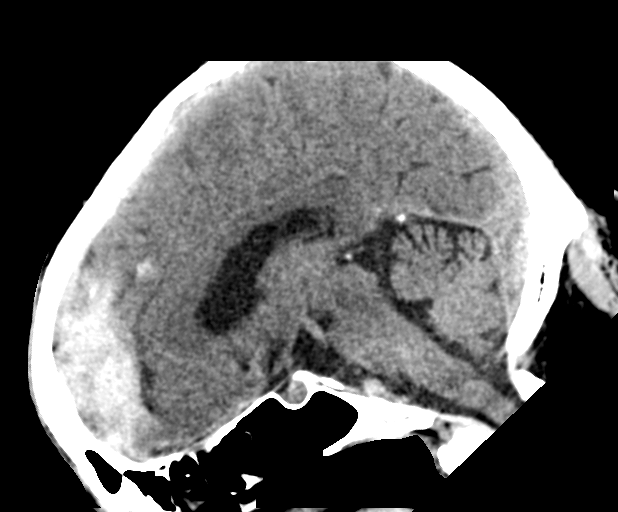
[im 43/64  brain]
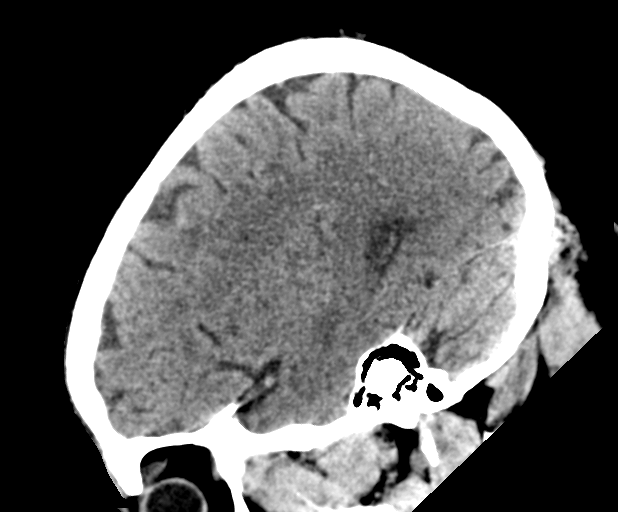

[15 of 37 positions shown; findings below may reference images not displayed]

FINDINGS: CT HEAD FINDINGS

Brain: There is a large acute right subdural hematoma which overlies
the inferior right frontal lobe and anterior right temporal lobe.
This measures 1.3 cm in thickness measured along the undersurface of
the inferior right frontal lobe, image [DATE]. There is associated
mass effect upon the underlying cerebral cortex with effacement of
the sulci. 2-3 mm of right to left midline shift noted.

Para falcine subdural hematoma is also identified. This measures
approximately 6 mm in thickness, image [DATE].

Mild hemorrhagic contusion noted over the right inferior frontal
gyrus, is suspected.

The ventricles are normal in volume. No intraventricular hemorrhage
identified.

There is mild diffuse low-attenuation within the subcortical and
periventricular white matter compatible with chronic microvascular
disease.

Remote lacunar infarct in the right cerebellar hemisphere is again
noted.

Vascular: No hyperdense vessel or unexpected calcification.

Skull: Normal. Negative for fracture or focal lesion.

Other: None

CT MAXILLOFACIAL FINDINGS

Osseous: No fracture or mandibular dislocation. No destructive
process.

Orbits: Negative. No traumatic or inflammatory finding.

Sinuses: Clear.

Soft tissues: Negative.

CT CERVICAL SPINE FINDINGS

Alignment: There is no signs of acute posttraumatic malalignment of
the cervical spine. Anterolisthesis of C7 on T1 measures 3 mm and is
likely related to chronic spondylosis.

Skull base and vertebrae: No acute fracture. No primary bone lesion
or focal pathologic process.

Soft tissues and spinal canal: No prevertebral fluid or swelling. No
visible canal hematoma.

Disc levels: Marked multilevel disc space narrowing with endplate
spurring is identified from C2-3 through C7-T1.

Upper chest: There is pleural thickening along the medial aspect of
the left apex which is only partially visualized but appears similar
to 12/10/2021. On the scout radiograph signs of stent graft repair
of thoracic aortic aneurysm noted.

Other: None
IMPRESSION: 1. Large acute right subdural hematoma overlying the inferior right
frontal lobe and anterior right temporal lobe with associated mass
effect upon the underlying cerebral cortex and 2-3 mm of right to
left midline shift.
2. Para falcine subdural hematoma is also identified.
3. Mild hemorrhagic contusion over the right inferior frontal gyrus.
4. No evidence for facial bone fracture or dislocation.
5. No evidence for acute cervical spine fracture or subluxation.
6. Marked multilevel degenerative disc disease within the cervical
spine.

Critical Value/emergent results were called by telephone at the time
of interpretation on 02/12/2022 at [DATE] to provider DUCKENSON
MINY , who verbally acknowledged these results.

## 2022-07-02 IMAGING — DX DG WRIST COMPLETE 3+V*L*
4 series · 4 of 4 positions shown · non-contrast
Comparison: None.

CLINICAL DATA: Left wrist pain.  Fall.

EXAM:
LEFT WRIST - COMPLETE 3+ VIEW

[wrist ap (1 of 2)]
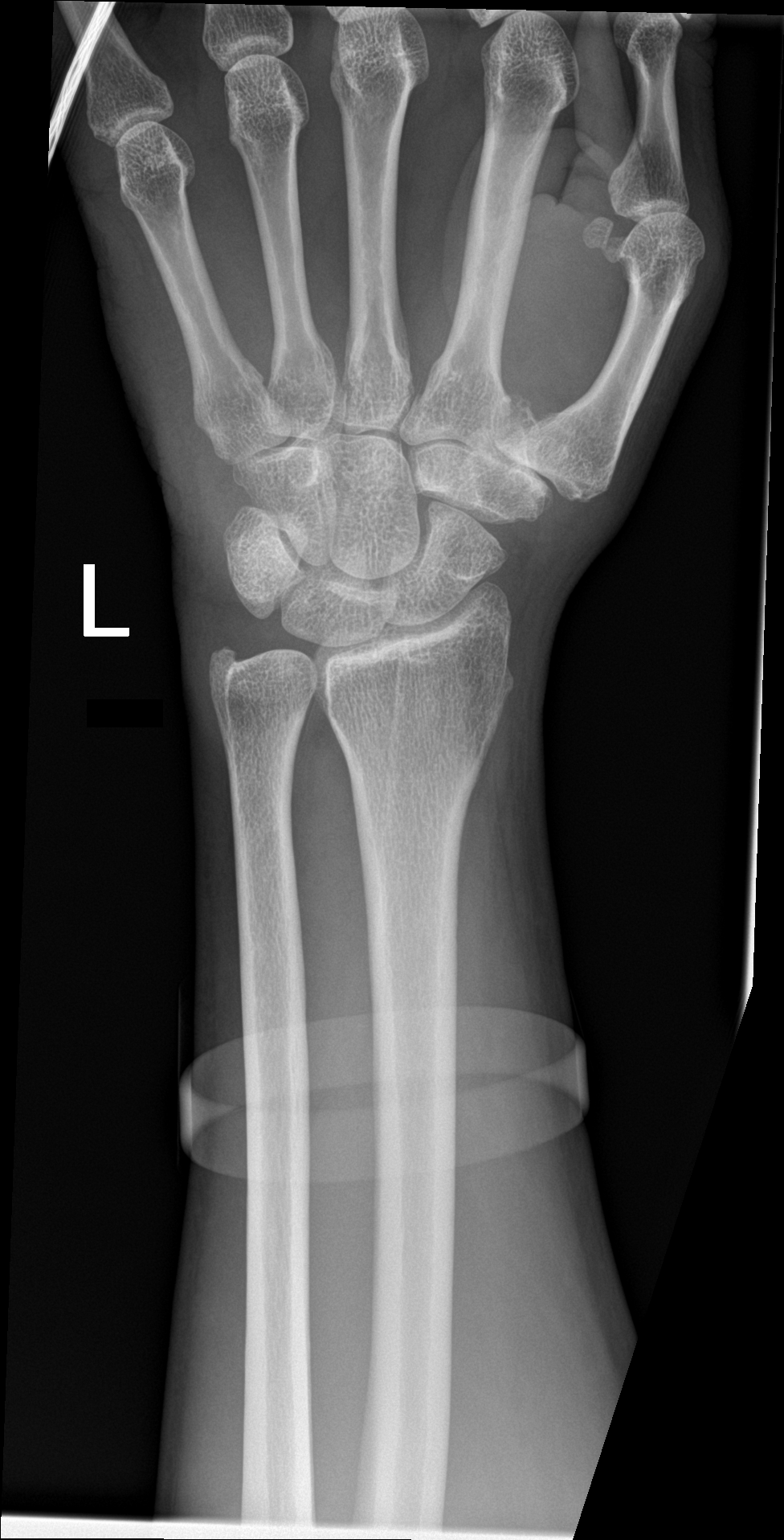

[wrist obl]
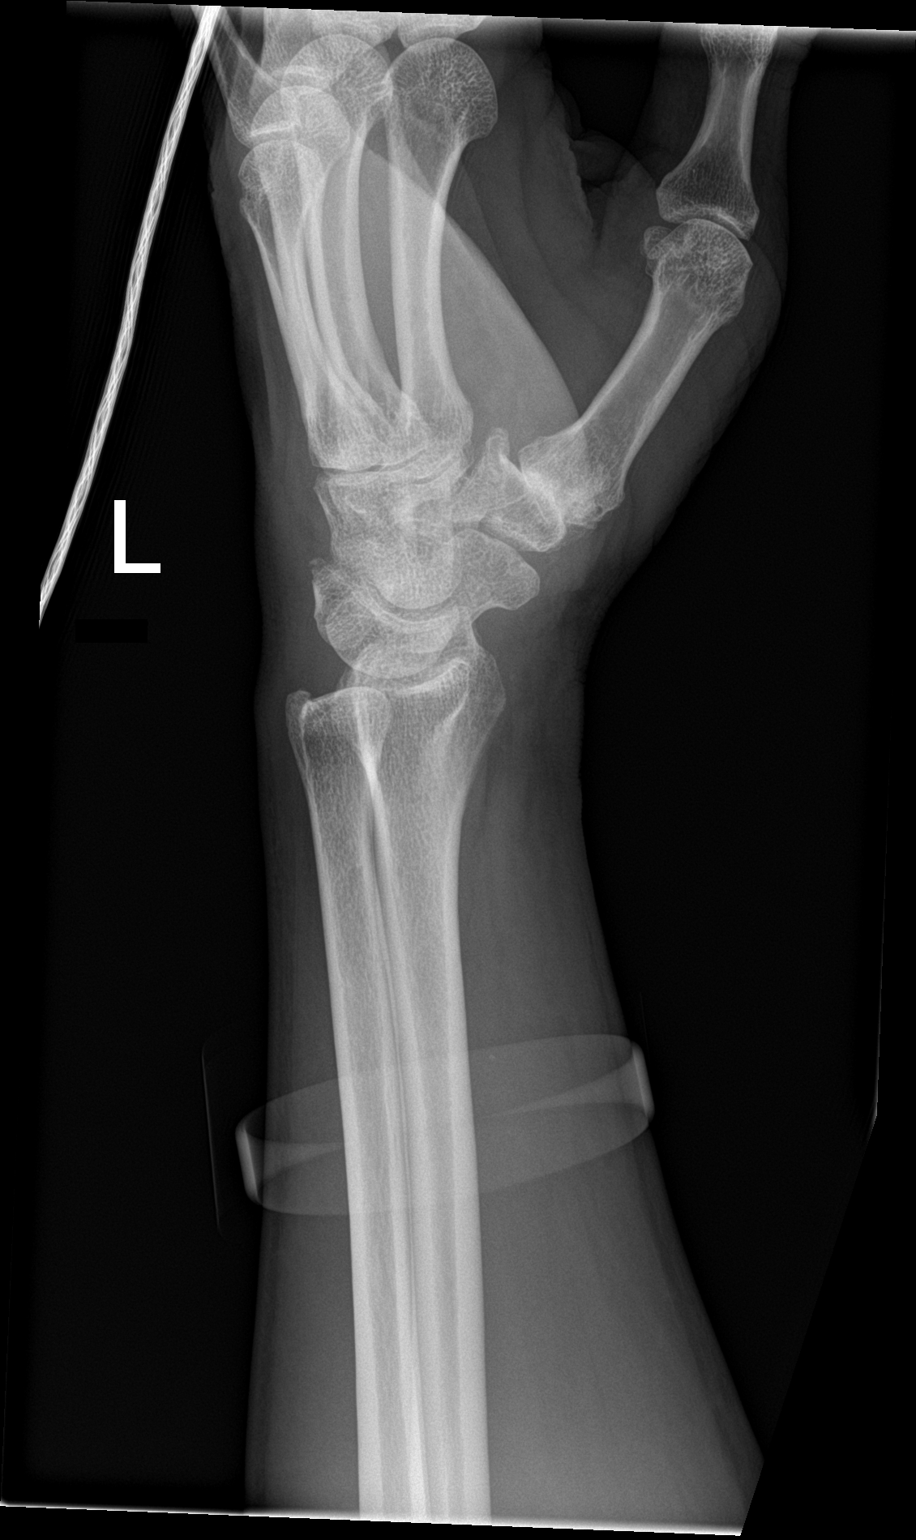

[wrist lat]
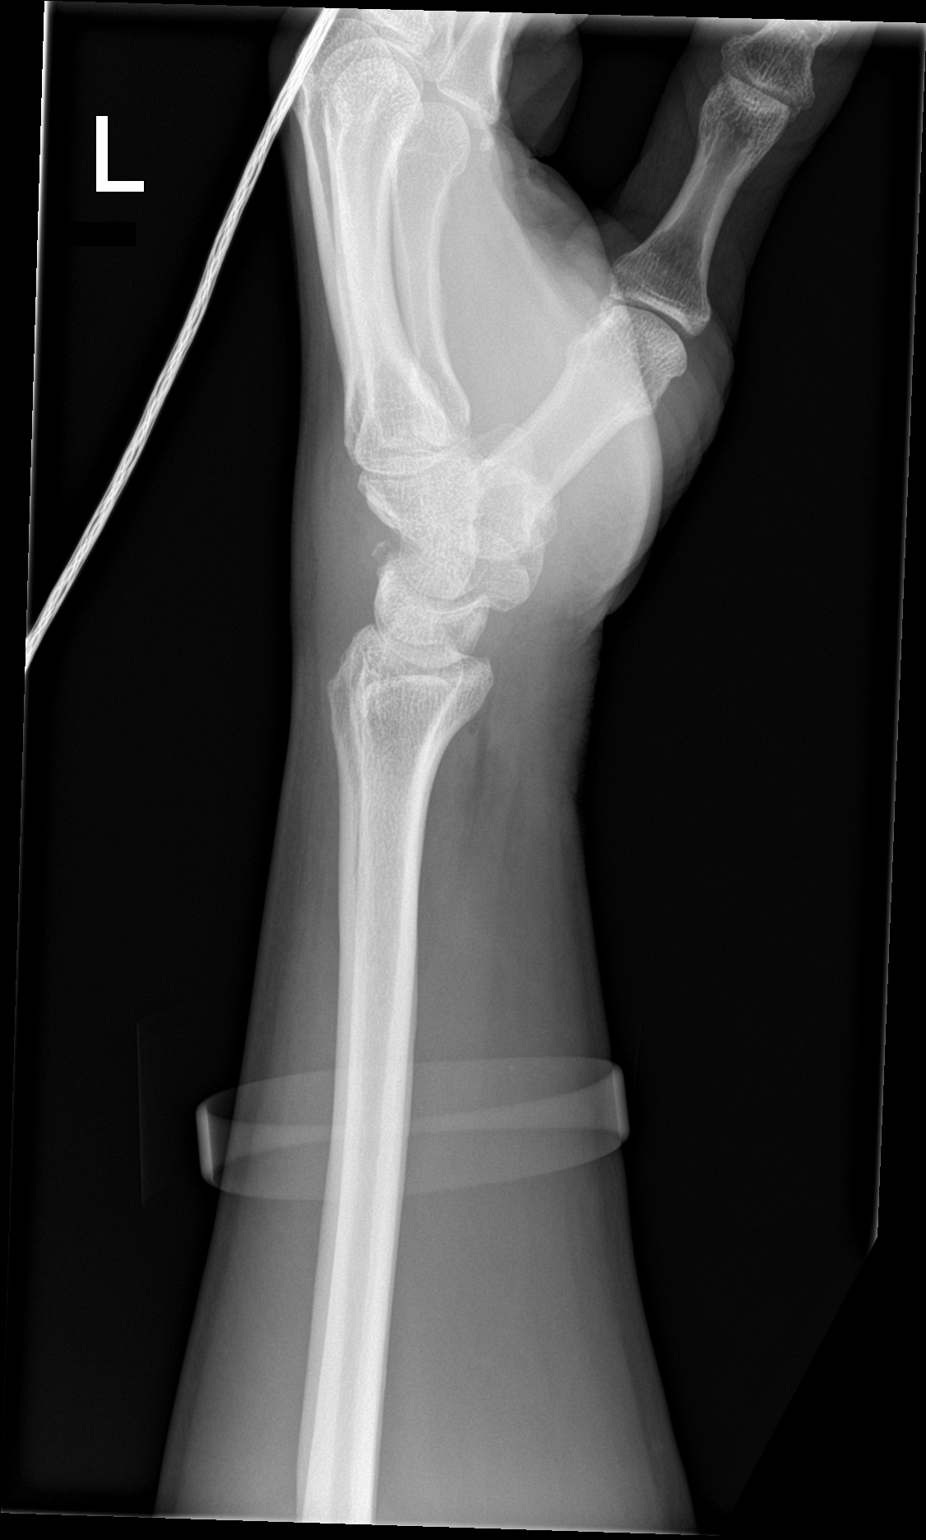

[wrist ap (2 of 2)]
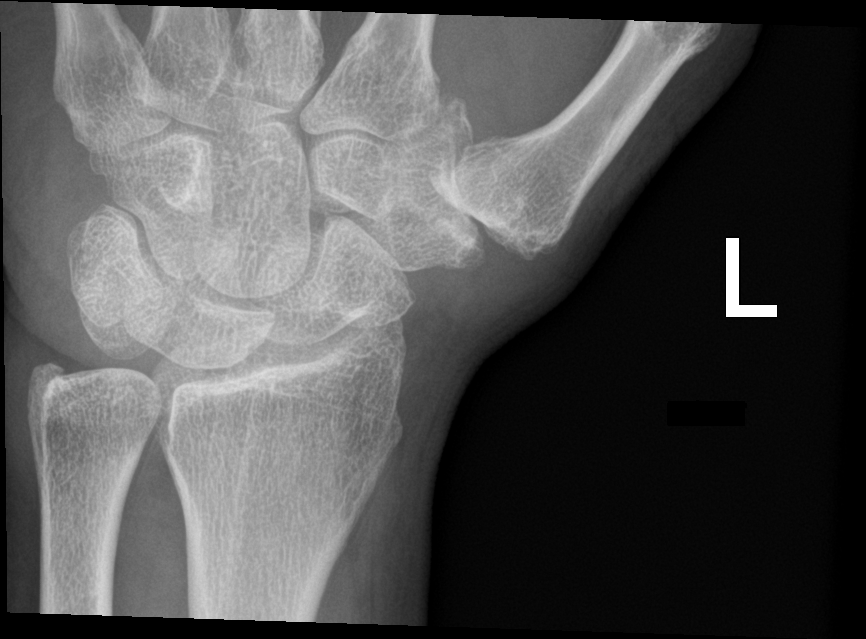

[4 of 4 positions shown; findings below may reference images not displayed]

FINDINGS: There is an os ossific density along the dorsum of the wrist which
may reflect underlying triquetral fracture. Mild overlying soft
tissue swelling identified. No additional signs of fracture or
dislocation. Degenerative changes noted at the basilar joint.
IMPRESSION: 1. Ossific density along the dorsum of the wrist may reflect
underlying triquetral fracture. Correlate for any focal tenderness
over this area.
2. Basilar joint osteoarthritis.

## 2022-07-02 IMAGING — DX DG SHOULDER 1V*L*
1 series · 1 of 1 positions shown · non-contrast
Comparison: Portable chest today.  Left humerus series 09/02/2021.

CLINICAL DATA: 61-year-old male status post fall on blood thinners.

EXAM:
LEFT SHOULDER

[shoulder ap]
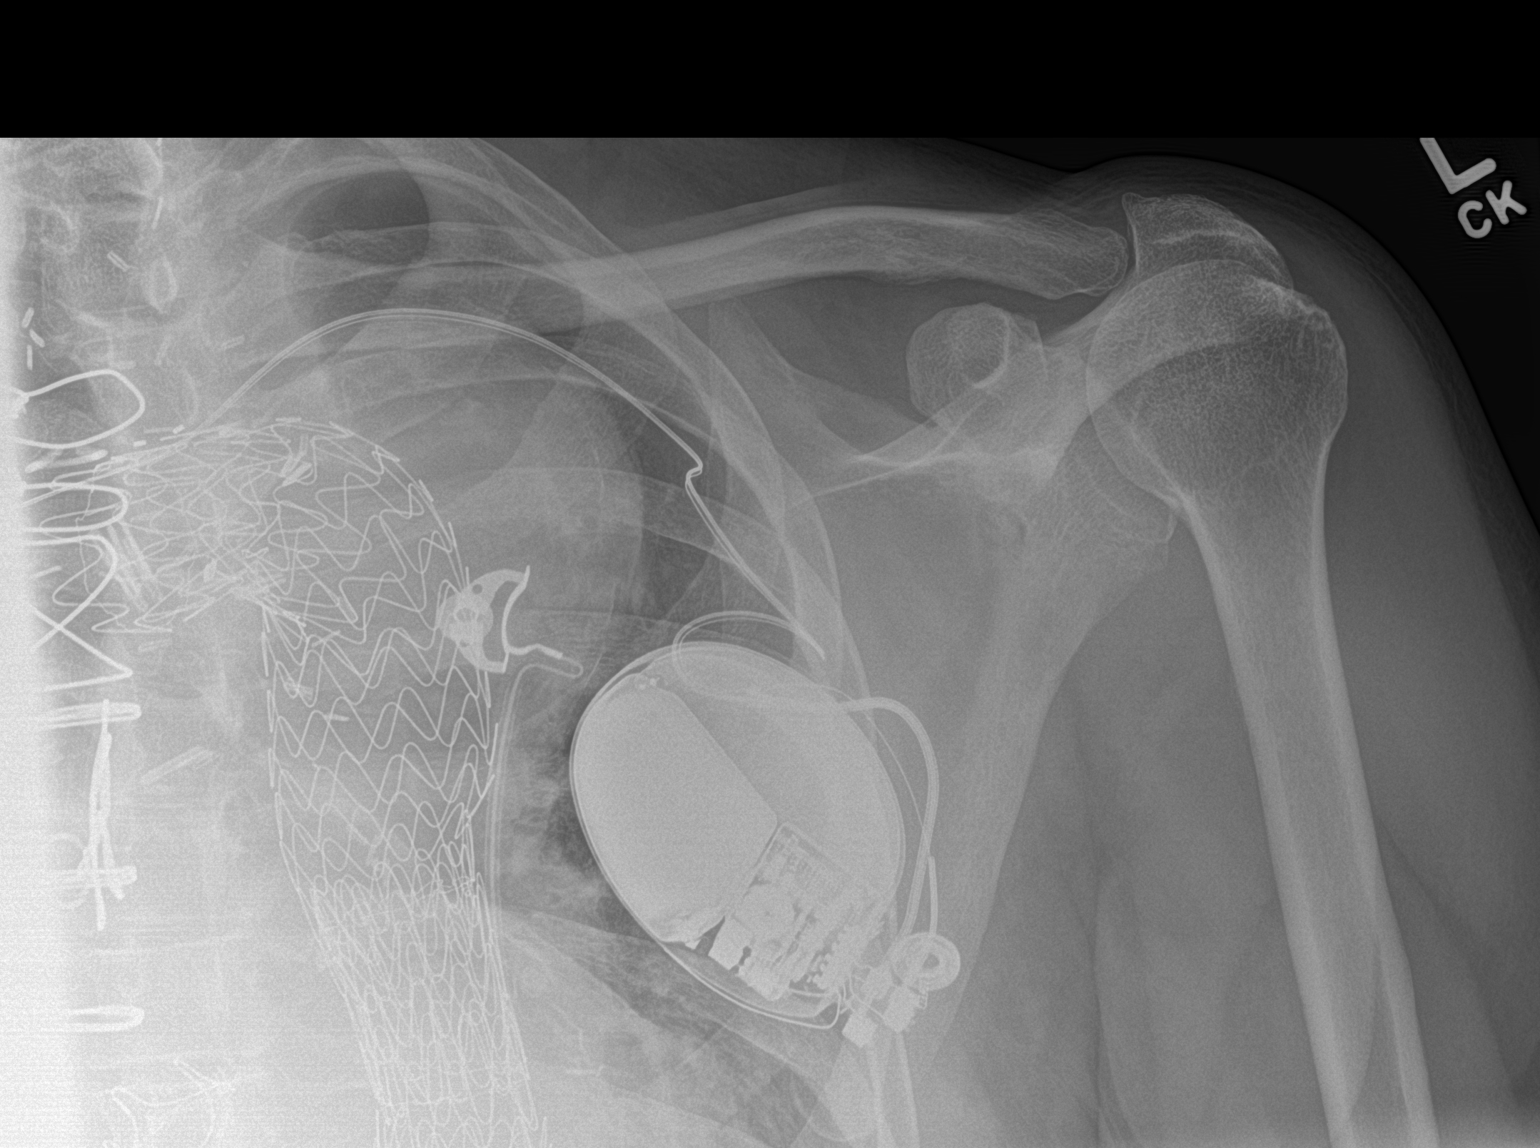

[1 of 1 positions shown; findings below may reference images not displayed]

FINDINGS: Single AP view of the left shoulder. Partially visible LVAD
generator device, thoracic aortic aneurysm and endograft. Stable
alignment about the left shoulder on this single view. Proximal left
humerus appears intact. No acute osseous abnormality identified.
IMPRESSION: No acute osseous abnormality identified.

## 2022-07-02 IMAGING — CT CT HEAD W/O CM
3 of 4 series · 13 of 47 positions shown, 15 images · non-contrast
Comparison: Head CT 7367 hours today.
COMPARISON: Head CT 7367 hours today.

Addendum:
CLINICAL DATA: 61-year-old male status post fall on blood thinners
with subdural hematoma, hemorrhagic contusion. LVAD patient.



[Series 3: head without · axial · non-contrast · 0.47mm/px · z∈[-2,+123]mm · 7 of 35 slices shown, 9 images]
[im 5/35  brain]
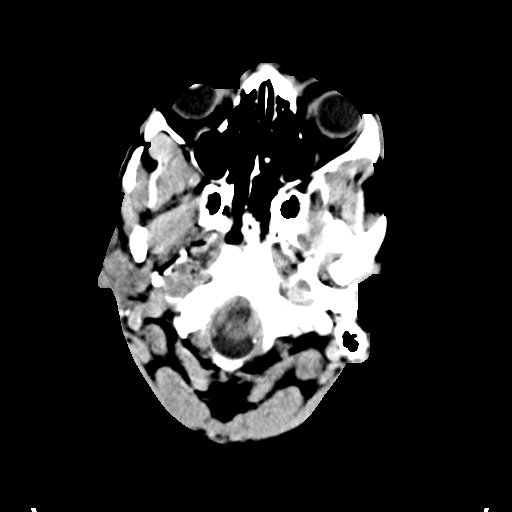
[im 5/35  bone]
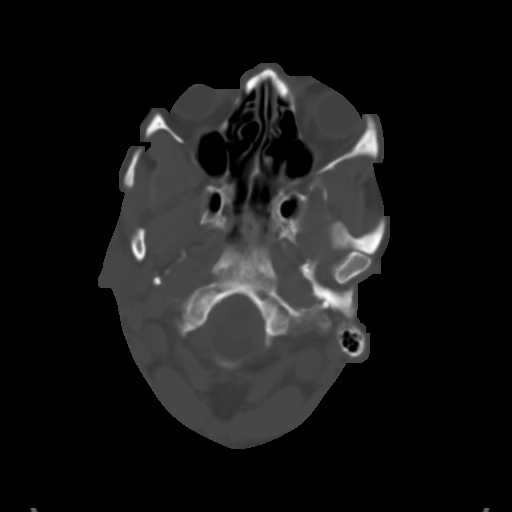
[im 9/35  brain]
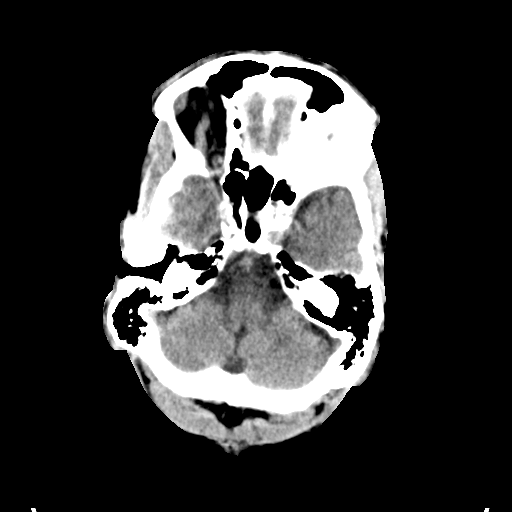
[im 13/35  brain]
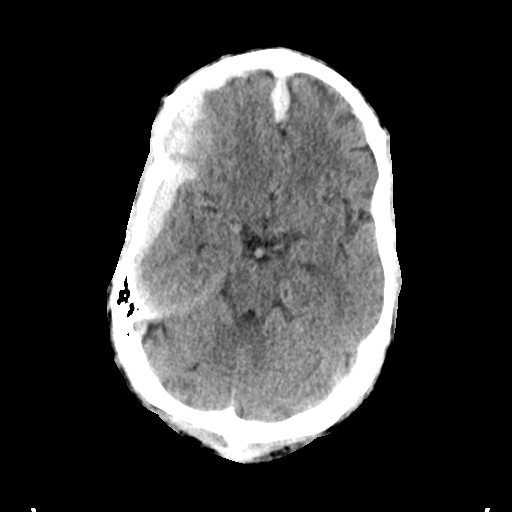
[im 18/35  brain]
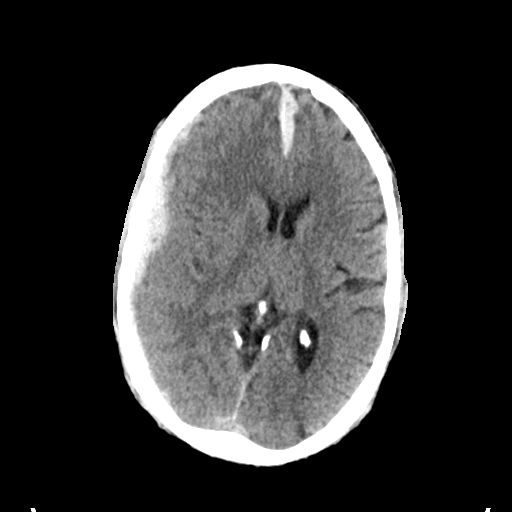
[im 22/35  brain]
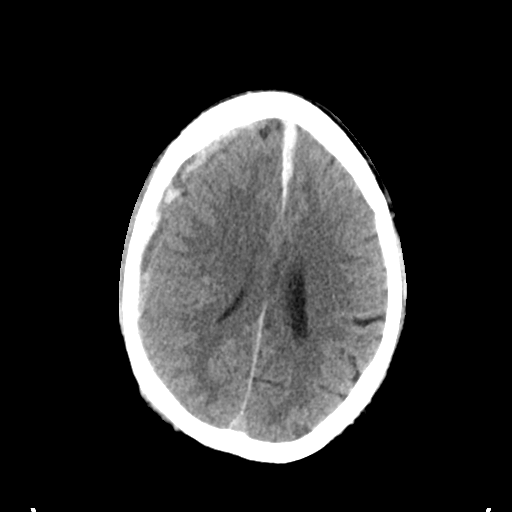
[im 22/35  bone]
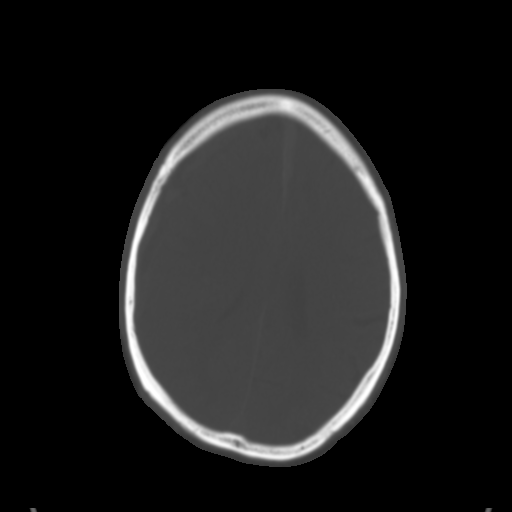
[im 26/35  brain]
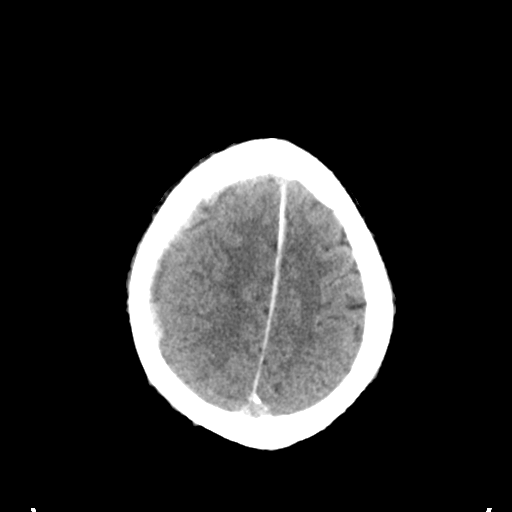
[im 30/35  brain]
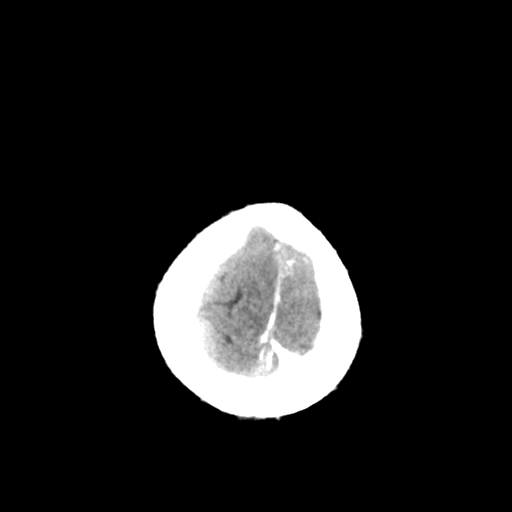

[Series 5: head without cor · coronal · non-contrast · 0.34mm/px · 3 of 69 slices shown]
[im 23/69  brain]
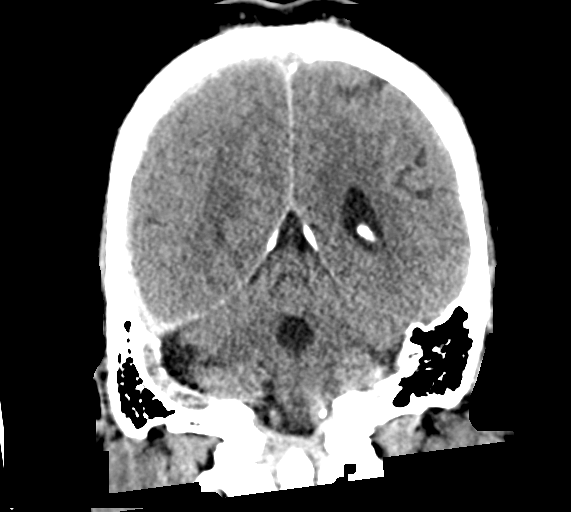
[im 31/69  brain]
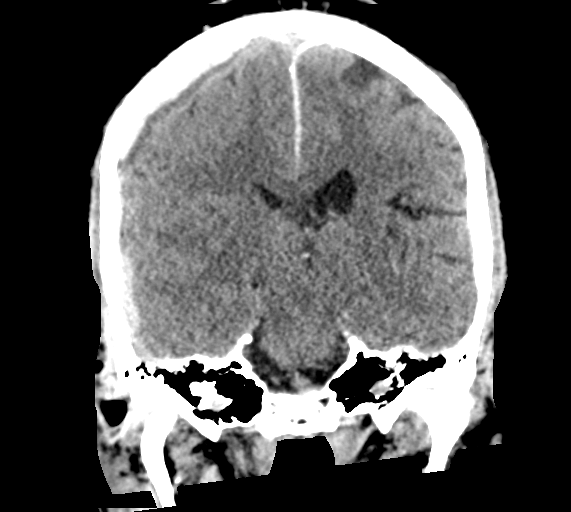
[im 38/69  brain]
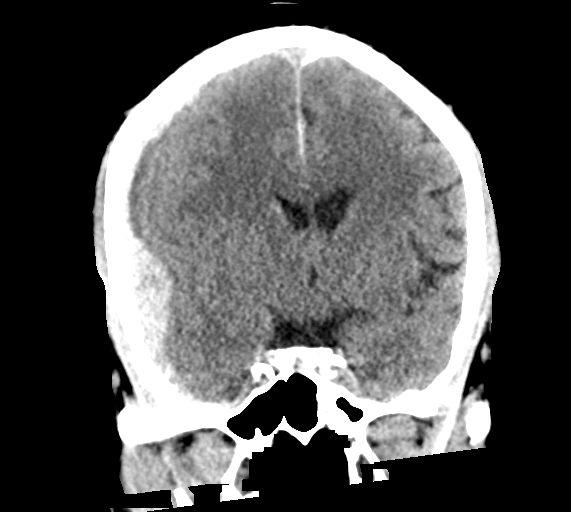

[Series 6: head without sag · sagittal · non-contrast · 0.34mm/px · 3 of 51 slices shown]
[im 17/51  brain]
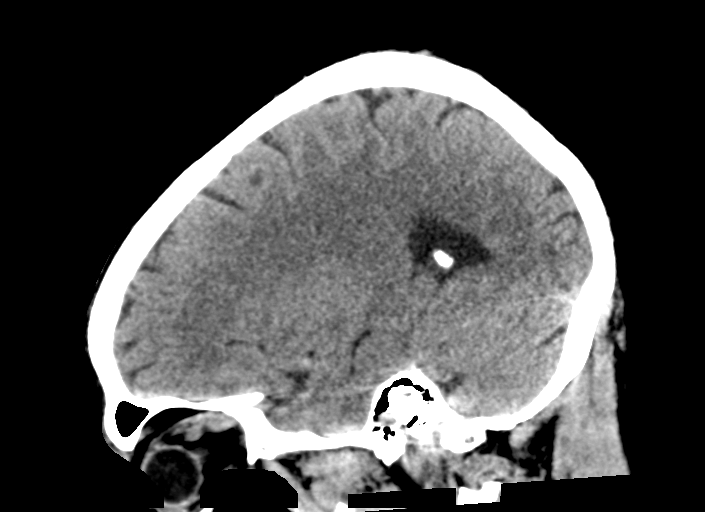
[im 26/51  brain]
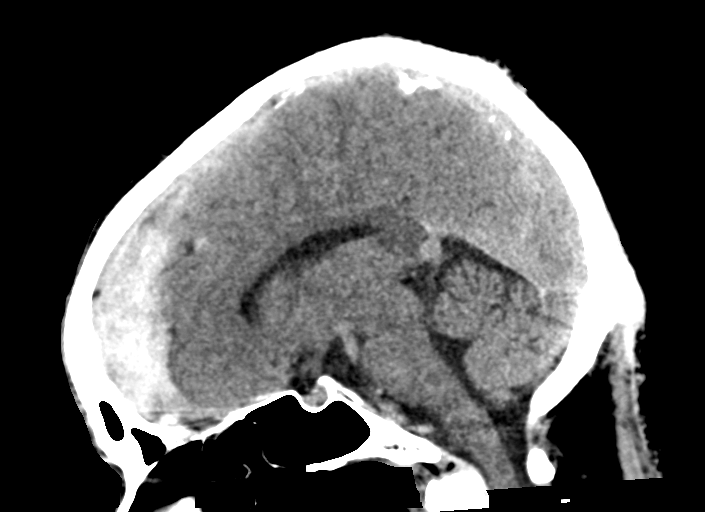
[im 34/51  brain]
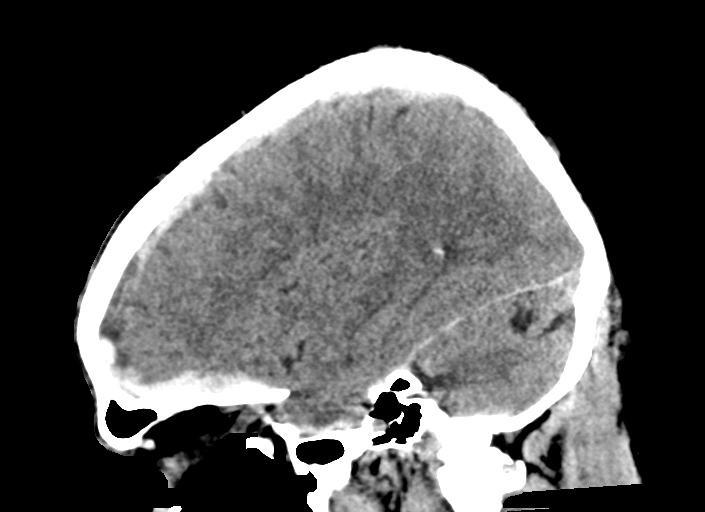

[13 of 47 positions shown; findings below may reference images not displayed]

FINDINGS: Brain: Lobulated hyperdense subdural hematoma has increased since
7367 hours today, is now more holo hemispheric (series 5, image 43),
with continued areas of mixed density. The most lobulated component
at the right operculum is now 14 mm thickness (previously 12 mm),
and along most of the right convexity the subdural blood is 5-7 mm
thick, which is noticeably increased.

As result there is increased intracranial mass effect and leftward
midline shift now 5 mm (previously trace). Increased mass effect on
the right lateral ventricle. No ventriculomegaly.

Less convincing evidence now of superimposed hemorrhagic contusion
in the right inferior frontal gyrus, although could be obscured by
the increased subdural blood.

Mostly anterior para falcine subdural hematoma thickness up to 8 mm
has not significantly changed.

No intraventricular or subarachnoid hemorrhage identified. Basilar
cisterns remain patent. Stable gray-white matter differentiation
throughout the brain. Chronic small right cerebellar infarct. No
cortically based acute infarct identified.

Vascular: Calcified atherosclerosis at the skull base.

Skull: No fracture identified.

Sinuses/Orbits: Visualized paranasal sinuses and mastoids are clear.

Other: No discrete orbit or scalp soft tissue injury.
IMPRESSION: 1. Enlarged right convexity subdural hematoma since 7367 hours
today, extending farther across the hemisphere now and ranging from
5 mm up to 14 mm thick. No skull fracture identified.

2. Subsequent increased intracranial mass effect and leftward
midline shift now 5 mm (previously trace). No ventriculomegaly.
Basilar cisterns remain normal.

3. Stable smaller para falcine SDH. Less evidence of a hemorrhagic
contusion in the right inferior frontal gyrus, might have been
lobulated subdural blood.

4. Chronic small right cerebellar infarct.

ADDENDUM:
Study discussed by telephone with [HOSPITAL] Fco Jose, RN on 02/12/2022
at 2203 hours.

*** End of Addendum ***
FINDINGS: Brain: Lobulated hyperdense subdural hematoma has increased since
7367 hours today, is now more holo hemispheric (series 5, image 43),
with continued areas of mixed density. The most lobulated component
at the right operculum is now 14 mm thickness (previously 12 mm),
and along most of the right convexity the subdural blood is 5-7 mm
thick, which is noticeably increased.

As result there is increased intracranial mass effect and leftward
midline shift now 5 mm (previously trace). Increased mass effect on
the right lateral ventricle. No ventriculomegaly.

Less convincing evidence now of superimposed hemorrhagic contusion
in the right inferior frontal gyrus, although could be obscured by
the increased subdural blood.

Mostly anterior para falcine subdural hematoma thickness up to 8 mm
has not significantly changed.

No intraventricular or subarachnoid hemorrhage identified. Basilar
cisterns remain patent. Stable gray-white matter differentiation
throughout the brain. Chronic small right cerebellar infarct. No
cortically based acute infarct identified.

Vascular: Calcified atherosclerosis at the skull base.

Skull: No fracture identified.

Sinuses/Orbits: Visualized paranasal sinuses and mastoids are clear.

Other: No discrete orbit or scalp soft tissue injury.
IMPRESSION: 1. Enlarged right convexity subdural hematoma since 7367 hours
today, extending farther across the hemisphere now and ranging from
5 mm up to 14 mm thick. No skull fracture identified.

2. Subsequent increased intracranial mass effect and leftward
midline shift now 5 mm (previously trace). No ventriculomegaly.
Basilar cisterns remain normal.

3. Stable smaller para falcine SDH. Less evidence of a hemorrhagic
contusion in the right inferior frontal gyrus, might have been
lobulated subdural blood.

4. Chronic small right cerebellar infarct.

## 2022-07-02 IMAGING — DX DG CHEST 1V PORT
1 series · 1 of 1 positions shown · non-contrast
Comparison: Noncontrast chest CT 12/10/2021 and earlier.

CLINICAL DATA: 61-year-old male status post fall on blood thinners.
History of endograft treated thoracic aortic aneurysm. LVAD.

EXAM:
PORTABLE CHEST 1 VIEW

[chest ap]
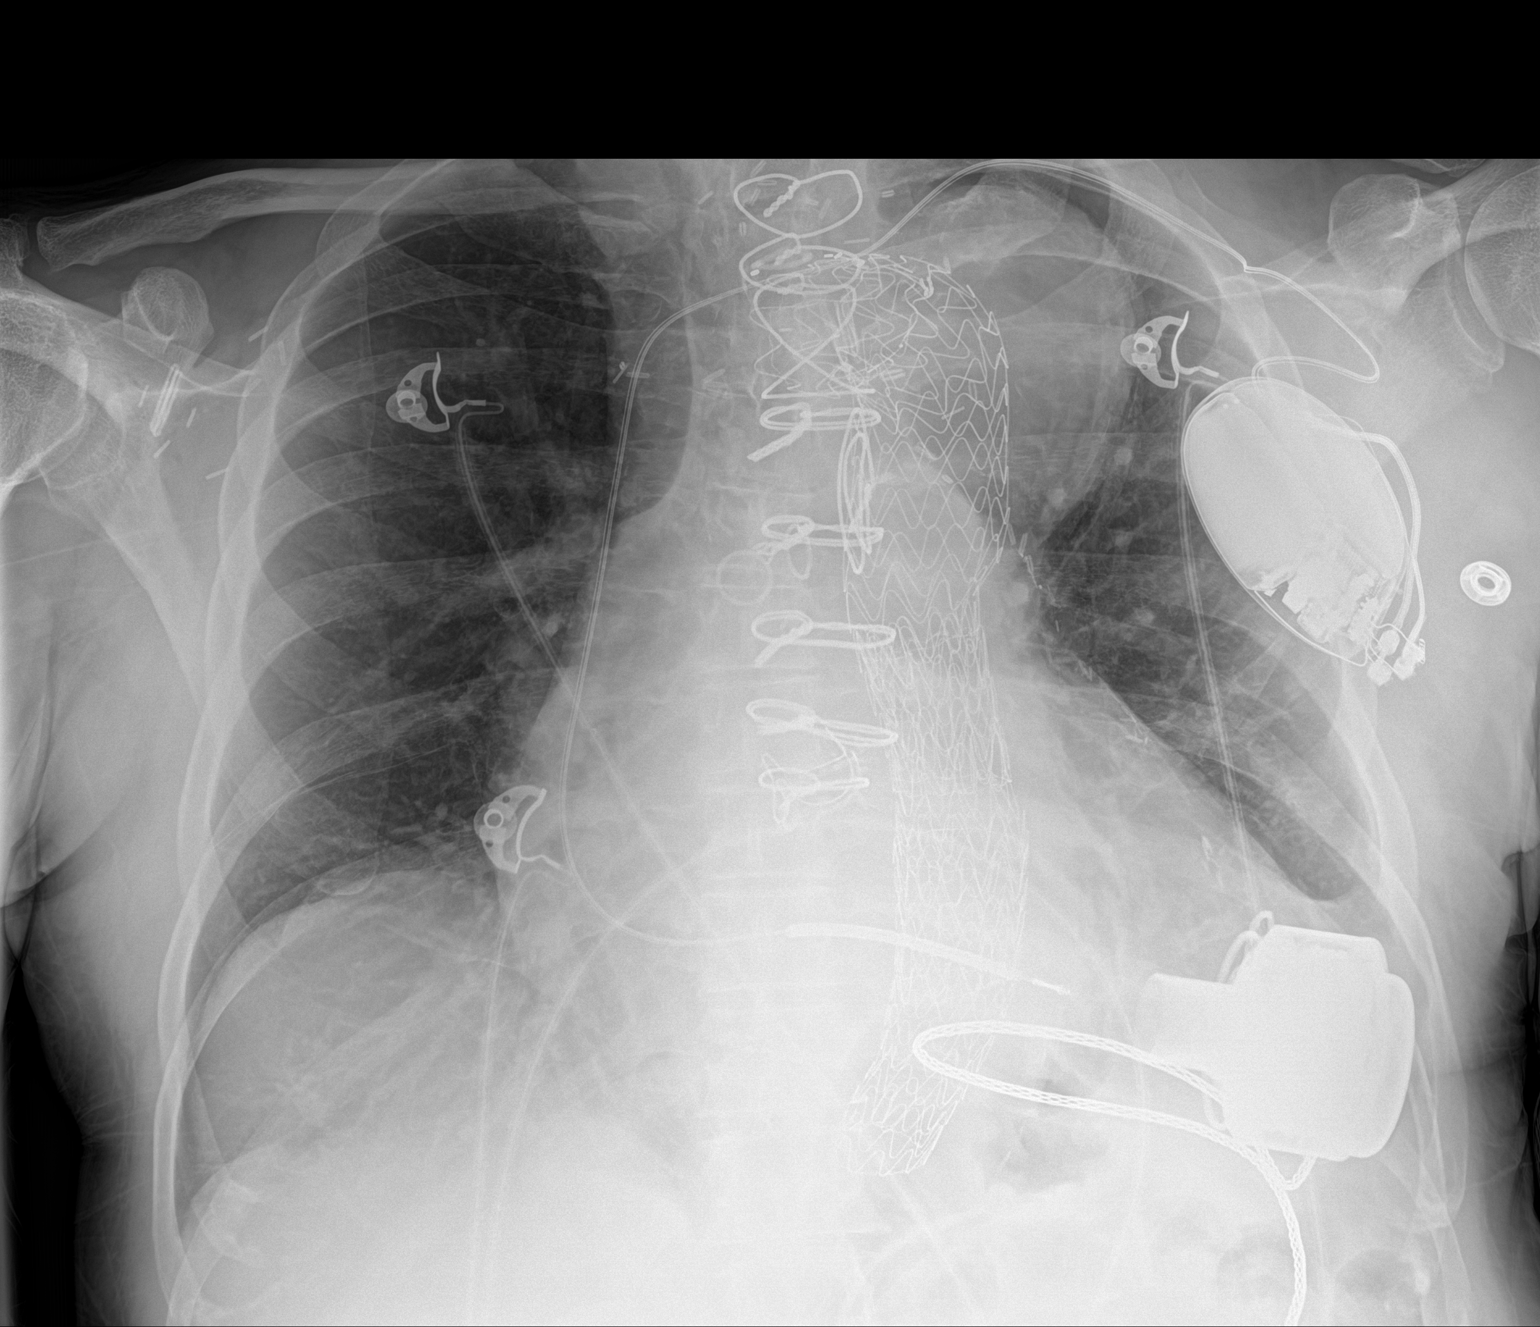

[1 of 1 positions shown; findings below may reference images not displayed]

FINDINGS: Portable AP semi upright view at 8566 hours. Long segment thoracic
aortic endograft from the arch through the level of the diaphragm
appears stable since last year with unchanged abnormal aortic
contour/aneurysm. Superimposed LVAD is stable. Prior CABG and aortic
valve replacement. Stable cardiomegaly and mediastinal contours.

No pneumothorax or pulmonary edema. Stable blunting of the left
costophrenic angle. No right pleural effusion. No new pulmonary
opacity.

No acute osseous abnormality identified. Negative visible bowel gas.
IMPRESSION: 1. No acute cardiopulmonary abnormality or acute traumatic injury
identified.
2. Stable chronic cardiomegaly, thoracic aortic aneurysm with
endograft, LVAD, small left pleural effusion.

## 2022-07-03 DIAGNOSIS — S065X0A Traumatic subdural hemorrhage without loss of consciousness, initial encounter: Secondary | ICD-10-CM | POA: Diagnosis not present

## 2022-07-04 ENCOUNTER — Telehealth (HOSPITAL_COMMUNITY): Payer: Self-pay | Admitting: *Deleted

## 2022-07-04 DIAGNOSIS — S065X0A Traumatic subdural hemorrhage without loss of consciousness, initial encounter: Secondary | ICD-10-CM | POA: Diagnosis not present

## 2022-07-04 NOTE — Telephone Encounter (Signed)
Pt last seen by [facility] team 06/14/22. Discussed with Dr Haroldine Laws- will move out pt's follow up share-care follow up appt with [place].   Called pt- no answer, voicemail is full; unable to leave message.   Called and spoke with pt's niece [name], who confirms pt was seen by [facility] team 2 weeks ago. Discussed need to move appt to September to space out follow up appts between [place] visits. [name] reports pt has follow up appt at [facility] 09/18/22. Will schedule pt in November for follow up in our clinic. She verbalized understanding, and will update pt.   Emerson Monte RN [place] Coordinator  Office: [phone]  24/7 Pager: [phone]

## 2022-07-05 DIAGNOSIS — S065X0A Traumatic subdural hemorrhage without loss of consciousness, initial encounter: Secondary | ICD-10-CM | POA: Diagnosis not present

## 2022-07-06 ENCOUNTER — Encounter (HOSPITAL_COMMUNITY): Payer: Medicaid Other

## 2022-07-06 DIAGNOSIS — S065X0A Traumatic subdural hemorrhage without loss of consciousness, initial encounter: Secondary | ICD-10-CM | POA: Diagnosis not present

## 2022-07-07 DIAGNOSIS — S065X0A Traumatic subdural hemorrhage without loss of consciousness, initial encounter: Secondary | ICD-10-CM | POA: Diagnosis not present

## 2022-07-10 DIAGNOSIS — M545 Low back pain, unspecified: Secondary | ICD-10-CM | POA: Diagnosis not present

## 2022-07-10 DIAGNOSIS — S065X0A Traumatic subdural hemorrhage without loss of consciousness, initial encounter: Secondary | ICD-10-CM | POA: Diagnosis not present

## 2022-07-10 DIAGNOSIS — I7103 Dissection of thoracoabdominal aorta: Secondary | ICD-10-CM | POA: Diagnosis not present

## 2022-07-10 DIAGNOSIS — Z95811 Presence of heart assist device: Secondary | ICD-10-CM | POA: Diagnosis not present

## 2022-07-11 DIAGNOSIS — S065X0A Traumatic subdural hemorrhage without loss of consciousness, initial encounter: Secondary | ICD-10-CM | POA: Diagnosis not present

## 2022-07-12 DIAGNOSIS — Z7901 Long term (current) use of anticoagulants: Secondary | ICD-10-CM | POA: Diagnosis not present

## 2022-07-12 DIAGNOSIS — S065X0A Traumatic subdural hemorrhage without loss of consciousness, initial encounter: Secondary | ICD-10-CM | POA: Diagnosis not present

## 2022-07-12 DIAGNOSIS — Z95811 Presence of heart assist device: Secondary | ICD-10-CM | POA: Diagnosis not present

## 2022-07-13 DIAGNOSIS — S065X0A Traumatic subdural hemorrhage without loss of consciousness, initial encounter: Secondary | ICD-10-CM | POA: Diagnosis not present

## 2022-07-14 DIAGNOSIS — S065X0A Traumatic subdural hemorrhage without loss of consciousness, initial encounter: Secondary | ICD-10-CM | POA: Diagnosis not present

## 2022-07-17 DIAGNOSIS — S065X0A Traumatic subdural hemorrhage without loss of consciousness, initial encounter: Secondary | ICD-10-CM | POA: Diagnosis not present

## 2022-07-18 DIAGNOSIS — S065X0A Traumatic subdural hemorrhage without loss of consciousness, initial encounter: Secondary | ICD-10-CM | POA: Diagnosis not present

## 2022-07-19 DIAGNOSIS — Z95811 Presence of heart assist device: Secondary | ICD-10-CM | POA: Diagnosis not present

## 2022-07-19 DIAGNOSIS — Z7901 Long term (current) use of anticoagulants: Secondary | ICD-10-CM | POA: Diagnosis not present

## 2022-07-19 DIAGNOSIS — S065X0A Traumatic subdural hemorrhage without loss of consciousness, initial encounter: Secondary | ICD-10-CM | POA: Diagnosis not present

## 2022-07-20 DIAGNOSIS — S065X0A Traumatic subdural hemorrhage without loss of consciousness, initial encounter: Secondary | ICD-10-CM | POA: Diagnosis not present

## 2022-07-20 DIAGNOSIS — M25551 Pain in right hip: Secondary | ICD-10-CM | POA: Diagnosis not present

## 2022-07-20 DIAGNOSIS — G890 Central pain syndrome: Secondary | ICD-10-CM | POA: Diagnosis not present

## 2022-07-20 DIAGNOSIS — M79662 Pain in left lower leg: Secondary | ICD-10-CM | POA: Diagnosis not present

## 2022-07-20 DIAGNOSIS — M79661 Pain in right lower leg: Secondary | ICD-10-CM | POA: Diagnosis not present

## 2022-07-20 DIAGNOSIS — Q249 Congenital malformation of heart, unspecified: Secondary | ICD-10-CM | POA: Diagnosis not present

## 2022-07-20 DIAGNOSIS — M5431 Sciatica, right side: Secondary | ICD-10-CM | POA: Diagnosis not present

## 2022-07-20 DIAGNOSIS — M542 Cervicalgia: Secondary | ICD-10-CM | POA: Diagnosis not present

## 2022-07-20 DIAGNOSIS — M25552 Pain in left hip: Secondary | ICD-10-CM | POA: Diagnosis not present

## 2022-07-20 DIAGNOSIS — G894 Chronic pain syndrome: Secondary | ICD-10-CM | POA: Diagnosis not present

## 2022-07-20 DIAGNOSIS — M545 Low back pain, unspecified: Secondary | ICD-10-CM | POA: Diagnosis not present

## 2022-07-21 DIAGNOSIS — S065X0A Traumatic subdural hemorrhage without loss of consciousness, initial encounter: Secondary | ICD-10-CM | POA: Diagnosis not present

## 2022-07-24 DIAGNOSIS — S065X0A Traumatic subdural hemorrhage without loss of consciousness, initial encounter: Secondary | ICD-10-CM | POA: Diagnosis not present

## 2022-07-25 DIAGNOSIS — S065X0A Traumatic subdural hemorrhage without loss of consciousness, initial encounter: Secondary | ICD-10-CM | POA: Diagnosis not present

## 2022-07-25 DIAGNOSIS — Z419 Encounter for procedure for purposes other than remedying health state, unspecified: Secondary | ICD-10-CM | POA: Diagnosis not present

## 2022-07-26 DIAGNOSIS — S065X0A Traumatic subdural hemorrhage without loss of consciousness, initial encounter: Secondary | ICD-10-CM | POA: Diagnosis not present

## 2022-07-27 DIAGNOSIS — S065X0A Traumatic subdural hemorrhage without loss of consciousness, initial encounter: Secondary | ICD-10-CM | POA: Diagnosis not present

## 2022-07-28 DIAGNOSIS — I252 Old myocardial infarction: Secondary | ICD-10-CM | POA: Diagnosis not present

## 2022-07-28 DIAGNOSIS — E785 Hyperlipidemia, unspecified: Secondary | ICD-10-CM | POA: Diagnosis not present

## 2022-07-28 DIAGNOSIS — R6889 Other general symptoms and signs: Secondary | ICD-10-CM | POA: Diagnosis not present

## 2022-07-28 DIAGNOSIS — Z79899 Other long term (current) drug therapy: Secondary | ICD-10-CM | POA: Diagnosis not present

## 2022-07-28 DIAGNOSIS — H538 Other visual disturbances: Secondary | ICD-10-CM | POA: Diagnosis not present

## 2022-07-28 DIAGNOSIS — H5711 Ocular pain, right eye: Secondary | ICD-10-CM | POA: Diagnosis not present

## 2022-07-28 DIAGNOSIS — S065XAA Traumatic subdural hemorrhage with loss of consciousness status unknown, initial encounter: Secondary | ICD-10-CM | POA: Diagnosis not present

## 2022-07-28 DIAGNOSIS — W1839XA Other fall on same level, initial encounter: Secondary | ICD-10-CM | POA: Diagnosis not present

## 2022-07-28 DIAGNOSIS — Z7901 Long term (current) use of anticoagulants: Secondary | ICD-10-CM | POA: Diagnosis not present

## 2022-07-28 DIAGNOSIS — N182 Chronic kidney disease, stage 2 (mild): Secondary | ICD-10-CM | POA: Diagnosis not present

## 2022-07-28 DIAGNOSIS — I509 Heart failure, unspecified: Secondary | ICD-10-CM | POA: Diagnosis not present

## 2022-07-28 DIAGNOSIS — K219 Gastro-esophageal reflux disease without esophagitis: Secondary | ICD-10-CM | POA: Diagnosis not present

## 2022-07-28 DIAGNOSIS — S065X0A Traumatic subdural hemorrhage without loss of consciousness, initial encounter: Secondary | ICD-10-CM | POA: Diagnosis not present

## 2022-07-31 DIAGNOSIS — H524 Presbyopia: Secondary | ICD-10-CM | POA: Diagnosis not present

## 2022-07-31 DIAGNOSIS — Y92524 Gas station as the place of occurrence of the external cause: Secondary | ICD-10-CM | POA: Diagnosis not present

## 2022-07-31 DIAGNOSIS — S065XAA Traumatic subdural hemorrhage with loss of consciousness status unknown, initial encounter: Secondary | ICD-10-CM | POA: Diagnosis not present

## 2022-07-31 DIAGNOSIS — W1839XA Other fall on same level, initial encounter: Secondary | ICD-10-CM | POA: Diagnosis not present

## 2022-07-31 DIAGNOSIS — S065X0A Traumatic subdural hemorrhage without loss of consciousness, initial encounter: Secondary | ICD-10-CM | POA: Diagnosis not present

## 2022-08-01 DIAGNOSIS — S065X0A Traumatic subdural hemorrhage without loss of consciousness, initial encounter: Secondary | ICD-10-CM | POA: Diagnosis not present

## 2022-08-02 DIAGNOSIS — Z95811 Presence of heart assist device: Secondary | ICD-10-CM | POA: Diagnosis not present

## 2022-08-02 DIAGNOSIS — S065X0A Traumatic subdural hemorrhage without loss of consciousness, initial encounter: Secondary | ICD-10-CM | POA: Diagnosis not present

## 2022-08-02 DIAGNOSIS — Z7901 Long term (current) use of anticoagulants: Secondary | ICD-10-CM | POA: Diagnosis not present

## 2022-08-03 DIAGNOSIS — S065X0A Traumatic subdural hemorrhage without loss of consciousness, initial encounter: Secondary | ICD-10-CM | POA: Diagnosis not present

## 2022-08-04 DIAGNOSIS — S065X0A Traumatic subdural hemorrhage without loss of consciousness, initial encounter: Secondary | ICD-10-CM | POA: Diagnosis not present

## 2022-08-07 DIAGNOSIS — Z95811 Presence of heart assist device: Secondary | ICD-10-CM | POA: Diagnosis not present

## 2022-08-07 DIAGNOSIS — S065X0A Traumatic subdural hemorrhage without loss of consciousness, initial encounter: Secondary | ICD-10-CM | POA: Diagnosis not present

## 2022-08-07 DIAGNOSIS — Z7901 Long term (current) use of anticoagulants: Secondary | ICD-10-CM | POA: Diagnosis not present

## 2022-08-08 DIAGNOSIS — R42 Dizziness and giddiness: Secondary | ICD-10-CM | POA: Diagnosis not present

## 2022-08-08 DIAGNOSIS — Z9889 Other specified postprocedural states: Secondary | ICD-10-CM | POA: Diagnosis not present

## 2022-08-08 DIAGNOSIS — I252 Old myocardial infarction: Secondary | ICD-10-CM | POA: Diagnosis not present

## 2022-08-08 DIAGNOSIS — S065X0A Traumatic subdural hemorrhage without loss of consciousness, initial encounter: Secondary | ICD-10-CM | POA: Diagnosis not present

## 2022-08-08 DIAGNOSIS — N183 Chronic kidney disease, stage 3 unspecified: Secondary | ICD-10-CM | POA: Diagnosis not present

## 2022-08-08 DIAGNOSIS — I129 Hypertensive chronic kidney disease with stage 1 through stage 4 chronic kidney disease, or unspecified chronic kidney disease: Secondary | ICD-10-CM | POA: Diagnosis not present

## 2022-08-08 DIAGNOSIS — H538 Other visual disturbances: Secondary | ICD-10-CM | POA: Diagnosis not present

## 2022-08-08 DIAGNOSIS — Z95811 Presence of heart assist device: Secondary | ICD-10-CM | POA: Diagnosis not present

## 2022-08-09 ENCOUNTER — Telehealth: Payer: Self-pay

## 2022-08-09 DIAGNOSIS — S065X0A Traumatic subdural hemorrhage without loss of consciousness, initial encounter: Secondary | ICD-10-CM | POA: Diagnosis not present

## 2022-08-09 NOTE — Telephone Encounter (Signed)
Transition Care Management Unsuccessful Follow-up Telephone Call  Date of discharge and from where:  08/08/2022 from [facility]  Attempts:  1st Attempt  Reason for unsuccessful TCM follow-up call:  Unable to leave message

## 2022-08-10 DIAGNOSIS — S065X0A Traumatic subdural hemorrhage without loss of consciousness, initial encounter: Secondary | ICD-10-CM | POA: Diagnosis not present

## 2022-08-11 ENCOUNTER — Emergency Department (HOSPITAL_COMMUNITY)
Admission: EM | Admit: 2022-08-11 | Discharge: 2022-08-12 | Disposition: A | Discharge status: Other Acute Inpatient Hospital | Payer: Medicaid Other | Attending: Emergency Medicine | Admitting: Emergency Medicine

## 2022-08-11 ENCOUNTER — Emergency Department (HOSPITAL_COMMUNITY): Payer: Medicaid Other

## 2022-08-11 DIAGNOSIS — Z95811 Presence of heart assist device: Secondary | ICD-10-CM | POA: Diagnosis not present

## 2022-08-11 DIAGNOSIS — Z4502 Encounter for adjustment and management of automatic implantable cardiac defibrillator: Secondary | ICD-10-CM | POA: Diagnosis not present

## 2022-08-11 DIAGNOSIS — S065X0A Traumatic subdural hemorrhage without loss of consciousness, initial encounter: Secondary | ICD-10-CM | POA: Diagnosis not present

## 2022-08-11 DIAGNOSIS — I1310 Hypertensive heart and chronic kidney disease without heart failure, with stage 1 through stage 4 chronic kidney disease, or unspecified chronic kidney disease: Secondary | ICD-10-CM | POA: Diagnosis not present

## 2022-08-11 DIAGNOSIS — S0083XA Contusion of other part of head, initial encounter: Secondary | ICD-10-CM | POA: Diagnosis not present

## 2022-08-11 DIAGNOSIS — Z79899 Other long term (current) drug therapy: Secondary | ICD-10-CM | POA: Diagnosis not present

## 2022-08-11 DIAGNOSIS — Z743 Need for continuous supervision: Secondary | ICD-10-CM | POA: Diagnosis not present

## 2022-08-11 DIAGNOSIS — S0990XA Unspecified injury of head, initial encounter: Secondary | ICD-10-CM | POA: Diagnosis not present

## 2022-08-11 DIAGNOSIS — R000 Tachycardia, unspecified: Secondary | ICD-10-CM | POA: Diagnosis not present

## 2022-08-11 DIAGNOSIS — Z85850 Personal history of malignant neoplasm of thyroid: Secondary | ICD-10-CM | POA: Insufficient documentation

## 2022-08-11 DIAGNOSIS — I499 Cardiac arrhythmia, unspecified: Secondary | ICD-10-CM | POA: Diagnosis not present

## 2022-08-11 DIAGNOSIS — N183 Chronic kidney disease, stage 3 unspecified: Secondary | ICD-10-CM | POA: Diagnosis not present

## 2022-08-11 DIAGNOSIS — I5023 Acute on chronic systolic (congestive) heart failure: Secondary | ICD-10-CM | POA: Diagnosis not present

## 2022-08-11 DIAGNOSIS — Z91040 Latex allergy status: Secondary | ICD-10-CM | POA: Diagnosis not present

## 2022-08-11 DIAGNOSIS — I509 Heart failure, unspecified: Secondary | ICD-10-CM | POA: Insufficient documentation

## 2022-08-11 DIAGNOSIS — R4589 Other symptoms and signs involving emotional state: Secondary | ICD-10-CM | POA: Diagnosis not present

## 2022-08-11 DIAGNOSIS — R4182 Altered mental status, unspecified: Secondary | ICD-10-CM | POA: Diagnosis not present

## 2022-08-11 DIAGNOSIS — R55 Syncope and collapse: Secondary | ICD-10-CM

## 2022-08-11 DIAGNOSIS — J45909 Unspecified asthma, uncomplicated: Secondary | ICD-10-CM | POA: Insufficient documentation

## 2022-08-11 DIAGNOSIS — I2510 Atherosclerotic heart disease of native coronary artery without angina pectoris: Secondary | ICD-10-CM | POA: Diagnosis not present

## 2022-08-11 DIAGNOSIS — S199XXA Unspecified injury of neck, initial encounter: Secondary | ICD-10-CM | POA: Diagnosis not present

## 2022-08-11 DIAGNOSIS — S0003XA Contusion of scalp, initial encounter: Secondary | ICD-10-CM

## 2022-08-11 LAB — CBC WITH DIFFERENTIAL/PLATELET
Abs Immature Granulocytes: 0.01 10*3/uL (ref 0.00–0.07)
Basophils Absolute: 0 10*3/uL (ref 0.0–0.1)
Basophils Relative: 0 %
Eosinophils Absolute: 0.1 10*3/uL (ref 0.0–0.5)
Eosinophils Relative: 2 %
HCT: 38.1 % — ABNORMAL LOW (ref 39.0–52.0)
Hemoglobin: 11.8 g/dL — ABNORMAL LOW (ref 13.0–17.0)
Immature Granulocytes: 0 %
Lymphocytes Relative: 32 %
Lymphs Abs: 1.6 10*3/uL (ref 0.7–4.0)
MCH: 24.9 pg — ABNORMAL LOW (ref 26.0–34.0)
MCHC: 31 g/dL (ref 30.0–36.0)
MCV: 80.5 fL (ref 80.0–100.0)
Monocytes Absolute: 0.4 10*3/uL (ref 0.1–1.0)
Monocytes Relative: 9 %
Neutro Abs: 2.8 10*3/uL (ref 1.7–7.7)
Neutrophils Relative %: 57 %
Platelets: 138 10*3/uL — ABNORMAL LOW (ref 150–400)
RBC: 4.73 MIL/uL (ref 4.22–5.81)
RDW: 15.8 % — ABNORMAL HIGH (ref 11.5–15.5)
WBC: 4.9 10*3/uL (ref 4.0–10.5)
nRBC: 0 % (ref 0.0–0.2)

## 2022-08-11 LAB — COMPREHENSIVE METABOLIC PANEL
ALT: 10 U/L (ref 0–44)
AST: 21 U/L (ref 15–41)
Albumin: 4.2 g/dL (ref 3.5–5.0)
Alkaline Phosphatase: 81 U/L (ref 38–126)
Anion gap: 12 (ref 5–15)
BUN: 36 mg/dL — ABNORMAL HIGH (ref 6–20)
CO2: 22 mmol/L (ref 22–32)
Calcium: 9.4 mg/dL (ref 8.9–10.3)
Chloride: 102 mmol/L (ref 98–111)
Creatinine, Ser: 1.81 mg/dL — ABNORMAL HIGH (ref 0.61–1.24)
GFR, Estimated: 43 mL/min — ABNORMAL LOW (ref >60)
Glucose, Bld: 100 mg/dL — ABNORMAL HIGH (ref 70–99)
Potassium: 4.7 mmol/L (ref 3.5–5.1)
Sodium: 136 mmol/L (ref 135–145)
Total Bilirubin: 0.8 mg/dL (ref 0.3–1.2)
Total Protein: 7.8 g/dL (ref 6.5–8.1)

## 2022-08-11 LAB — I-STAT CHEM 8, ED
BUN: 35 mg/dL — ABNORMAL HIGH (ref 6–20)
Calcium, Ion: 1.14 mmol/L — ABNORMAL LOW (ref 1.15–1.40)
Chloride: 102 mmol/L (ref 98–111)
Creatinine, Ser: 1.9 mg/dL — ABNORMAL HIGH (ref 0.61–1.24)
Glucose, Bld: 96 mg/dL (ref 70–99)
HCT: 36 % — ABNORMAL LOW (ref 39.0–52.0)
Hemoglobin: 12.2 g/dL — ABNORMAL LOW (ref 13.0–17.0)
Potassium: 4.5 mmol/L (ref 3.5–5.1)
Sodium: 135 mmol/L (ref 135–145)
TCO2: 23 mmol/L (ref 22–32)

## 2022-08-11 LAB — PROTIME-INR
INR: 1.5 — ABNORMAL HIGH (ref 0.8–1.2)
Prothrombin Time: 18.2 seconds — ABNORMAL HIGH (ref 11.4–15.2)

## 2022-08-11 LAB — TROPONIN I (HIGH SENSITIVITY): Troponin I (High Sensitivity): 80 ng/L — ABNORMAL HIGH (ref <18)

## 2022-08-11 LAB — TYPE AND SCREEN
ABO/RH(D): O POS
Antibody Screen: NEGATIVE

## 2022-08-11 LAB — BRAIN NATRIURETIC PEPTIDE: B Natriuretic Peptide: 738.1 pg/mL — ABNORMAL HIGH (ref 0.0–100.0)

## 2022-08-11 MED ORDER — ACETAMINOPHEN 500 MG PO TABS
1000.0000 mg | ORAL_TABLET | Freq: Once | ORAL | Status: AC
  Administered 2022-08-11: 1000 mg via ORAL
  Filled 2022-08-11: qty 2

## 2022-08-11 MED ORDER — ASPIRIN 325 MG PO TABS: 325.0000 mg | ORAL_TABLET | Freq: Every day | ORAL | Status: DC

## 2022-08-11 MED ORDER — FENTANYL CITRATE PF 50 MCG/ML IJ SOSY
50.0000 ug | PREFILLED_SYRINGE | Freq: Once | INTRAMUSCULAR | Status: AC
  Administered 2022-08-11: 50 ug via INTRAVENOUS
  Filled 2022-08-11: qty 1

## 2022-08-11 NOTE — ED Provider Notes (Incomplete)
[place] EMERGENCY DEPARTMENT Provider Note  CSN: [id number] Arrival date & time: 08/11/22 2159  Chief Complaint(s) Loss of Consciousness  HPI Chad [name]. is a 61 y.o. male with history of LVAD, CAD, CHF, aortic dissection, hypertension, hyperlipidemia to the emergency department after syncopal event.  The patient reports that he was at the movie theater, he felt lightheaded, felt palpitations, lost consciousness.  He reports he hit his head.  He felt 1 shock from his AICD.  He reports that since then he has had chest pain, headache.  Denies nausea, vomiting, fevers, chills.  Reports he always has some neck pain which is unchanged.  No numbness, tingling.  Symptoms are constant.  Has not had any recent shocks or syncopal events otherwise.  Past Medical History Past Medical History:  Diagnosis Date   AICD (automatic cardioverter/defibrillator) present    Anemia    Anginal pain ([place])    Anxiety    Aortic dissection, thoracoabdominal ([place])    7/10: Type I s/p repair   Arthritis    Asthma    CAD (coronary artery disease)    a. s/p CABG 2006;  b. DES to PDA 2011 (cath: Dx not seen, dRCA/PDA tx with DES; S-PDA occluded (culprit), S-Dx occluded, S-RI and OM ok, L-LAD ok   Carotid stenosis    dopplers 2011: 0-39% bilat.   Chest pain syndrome    CHF (congestive heart failure) ([place])    06/14/21 LVAD placemnt [facility]   Chronic bronchitis (HCC)    Chronic lower back pain    Chronic systolic heart failure ([place])    a. 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; the other is hanging on" (11/07/2016)   Dyspnea    patient denies at 12/06/2018 appt    Family history of adverse reaction to anesthesia     "sister hard to wake up"   GERD (gastroesophageal reflux disease)    Gout    Heart murmur    HLD (hyperlipidemia)    HTN (hypertension)    severe   Myocardial infarction ([place])    "many" (11/07/2016)   PONV (postoperative nausea and vomiting)    Sleep apnea    does not use mask    Thyroid cancer ([place])    Hertle Cell   Patient Active Problem List   Diagnosis Date Noted   Current use of long term anticoagulation 05/31/2022   History of aortic dissection 02/12/2022   LVAD (left ventricular assist device) present ([place]) 02/12/2022   Left wrist fracture 02/12/2022   Inappropriate behavior 11/04/2020   Heart failure with reduced ejection fraction ([place]) 04/30/2020   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 53/61/4431   Colicky RUQ abdominal pain 11/30/2018   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 07/05/2018   Hip region mass, left 06/24/2018   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, bilateral 05/22/2018   Inguinal hernia 02/18/2018   Chronic kidney disease (CKD), stage II (mild) 06/06/2017   S/P tonsillectomy 06/06/2017   S/P rotator cuff surgery 06/06/2017   S/P partial thyroidectomy 06/06/2017   Solitary kidney 06/06/2017   Failure of implantable  cardioverter-defibrillator (ICD) lead 11/07/2016   Erectile dysfunction 04/18/2016   Aneurysm of descending thoracic aorta (HCC)    Nasal folliculitis 32/99/2426   Acute on chronic systolic CHF (congestive heart failure) ([place]) 04/12/2015   CKD (chronic kidney disease) stage 3, GFR 30-59 ml/min (HCC) 04/12/2015   Unstable angina ([place]) 01/25/2015   Chest pain 10/10/2014   Syncope 01/17/2013   Chronic combined systolic and diastolic CHF (congestive heart failure) ([place]) 01/16/2013   Low back pain 04/23/2012   Chest pain syndrome    Insomnia 09/14/2011   Thyroid cancer, 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   Bradycardia 05/03/2011   Aortic dissection,  thoracoabdominal ([place])    ORTHOSTATIC HYPOTENSION 05/03/2010   CAROTID BRUIT 01/25/2010   PANCYTOPENIA 11/01/2009   Fatigue 10/25/2009   DISSECTING AORTIC ANEURYSM THORACOABDOMINAL 07/14/2009   Sciatica of right side 05/18/2009   Hyperlipidemia 09/29/2008   Hypertension 09/29/2008   CAD, ARTERY BYPASS GRAFT 83/41/9622   SYSTOLIC HEART FAILURE, CHRONIC 09/29/2008   Implantable cardioverter-defibrillator (ICD) in situ 09/29/2008   CAD (coronary artery disease) of artery bypass graft 09/29/2008   Home Medication(s) Prior to Admission medications   Medication Sig Start Date End Date Taking? Authorizing Provider  acetaminophen (TYLENOL) 325 MG tablet Take 2 tablets (650 mg total) by mouth every 4 (four) hours as needed for headache or mild pain. 05/03/20   Clegg, Amy D, NP  Alirocumab (PRALUENT) 75 MG/ML SOAJ Inject 75 mg into the skin every 14 (fourteen) days. Patient not taking: Reported on 02/12/2022 09/03/20   Bensimhon, Shaune Pascal, MD  allopurinol (ZYLOPRIM) 100 MG tablet Take 1 tablet by mouth once daily 06/27/22   Bensimhon, Shaune Pascal, MD  amLODipine (NORVASC) 5 MG tablet Take 5 mg by mouth daily. 01/15/22   [provider]  carvedilol (COREG) 6.25 MG tablet Take 1 tablet (6.25 mg total) by mouth 2 (two) times daily with a meal. If systolic is less than 90 you may hold Coreg until systolic is greater than 90, then resume taking 10/21/21   Bensimhon, Shaune Pascal, MD  colchicine 0.6 MG tablet Take 1 tablet (0.6 mg total) by mouth daily. 11/03/21   Bensimhon, Shaune Pascal, MD  FARXIGA 5 MG TABS tablet Take 5 mg by mouth every morning. 02/15/21   [provider]  ferrous sulfate 325 (65 FE) MG tablet Take 325 mg by mouth daily with breakfast.    [provider]  fluticasone (FLONASE) 50 MCG/ACT nasal spray Place 2 sprays into both nostrils daily as needed for allergies or rhinitis.     [provider]  gabapentin (NEURONTIN) 300 MG capsule Take 1 capsule (300 mg total) by  mouth at bedtime. Patient taking differently: Take 300 mg by mouth 3 (three) times daily. 08/25/20   Bensimhon, Shaune Pascal, MD  hydrALAZINE (APRESOLINE) 100 MG tablet Take 100 mg by mouth 3 (three) times daily. 01/19/21   [provider]  isosorbide mononitrate (IMDUR) 30 MG 24 hr tablet Take 60 mg by mouth in the morning and at bedtime. 03/06/21   [provider]  naloxone [facility]) nasal spray 4 mg/0.1 mL Place 1 spray into the nose as needed (accidental overdose).    [provider]  nitroGLYCERIN (NITROSTAT) 0.4 MG SL tablet DISSOLVE ONE TABLET UNDER THE TONGUE EVERY 5 MINUTES AS NEEDED FOR CHEST PAIN.  MAY TAKE IF DIASTOLIC BP IS GREATER THAN 90 06/29/22   Bensimhon, Shaune Pascal, MD  Oxycodone HCl 20 MG TABS Take 20 mg by mouth every 6 (six)  hours. 12/22/19   [provider]  sacubitril-valsartan (ENTRESTO) 97-103 MG Take 1 tablet by mouth 2 (two) times daily.    [provider]  spironolactone (ALDACTONE) 25 MG tablet Take 25 mg by mouth daily.    [provider]                                                                                                                                    Past Surgical History Past Surgical History:  Procedure Laterality Date   BACK SURGERY     CARDIAC CATHETERIZATION     "several" (11/07/2016)   CARDIAC DEFIBRILLATOR PLACEMENT  11/2005   [employer]; Archie Endo 05/09/2011   CHOLECYSTECTOMY N/A 12/09/2018   Procedure: LAPAROSCOPIC CHOLECYSTECTOMY;  Surgeon: Ralene Ok, MD;  Location: [facility];  Service: General;  Laterality: N/A;   CORONARY ANGIOPLASTY WITH STENT PLACEMENT     "I've had 1-2 stents" (11/07/2016)   CORONARY ARTERY BYPASS GRAFT  06/21/2009   "CABG X2"   CORONARY ARTERY BYPASS GRAFT  06/29/2005   "CABG X7"   ICD LEAD REMOVAL  11/07/2016   ICD LEAD REMOVAL N/A 11/07/2016   Procedure: ICD LEAD REMOVAL, INSERTION OF NEW ICD LEAD;  Surgeon: Evans Lance, MD;  Location: [place];  Service:  Cardiovascular;  Laterality: N/A;  Dr. Prescott Gum to backup case   IMPLANTABLE CARDIOVERTER DEFIBRILLATOR (ICD) GENERATOR CHANGE N/A 12/13/2012   Procedure: ICD GENERATOR CHANGE;  Surgeon: Evans Lance, MD;  Location: [facility] CATH LAB;  Service: Cardiovascular;  Laterality: N/A;   INGUINAL HERNIA REPAIR Bilateral 06/25/2018   Procedure: LAPAROSCOPIC  BILATERAL INGUINAL HERNIA REPAIRS;  Surgeon: Ralene Ok, MD;  Location: [facility];  Service: General;  Laterality: Bilateral;   INSERTION OF MESH Bilateral 06/25/2018   Procedure: INSERTION OF MESH;  Surgeon: Ralene Ok, MD;  Location: [facility];  Service: General;  Laterality: Bilateral;   [place] SURGERY  01/2001    most recent within 5-10 years   RIGHT HEART CATH N/A 04/05/2018   Procedure: RIGHT HEART CATH;  Surgeon: Jolaine Artist, MD;  Location: [facility] CV LAB;  Service: Cardiovascular;  Laterality: N/A;   RIGHT HEART CATH N/A 01/19/2020   Procedure: RIGHT HEART CATH;  Surgeon: Jolaine Artist, MD;  Location: [facility] CV LAB;  Service: Cardiovascular;  Laterality: N/A;   SHOULDER ARTHROSCOPY WITH ROTATOR CUFF REPAIR Bilateral    Status post emergency repair of a type A ascending aortic dissection with a hemiarch reconstruction of the ascending aorta  using a 28-mm Hemashield graft with redo sternotomy and revision of previous bypass grafts in June 2010.     TESTICLE SURGERY     THYROIDECTOMY, PARTIAL  06/20/2011   TONSILLECTOMY     UMBILICAL HERNIA REPAIR N/A 06/25/2018   Procedure: UMBILICAL HERNIA REPAIR;  Surgeon: Ralene Ok, MD;  Location: [facility];  Service: General;  Laterality: N/A;   VASECTOMY  Family History Family History  Problem Relation Age of Onset   Hypertension Father    Heart disease Father    Early death Father    COPD Father    Hypertension Mother    Coronary artery disease Other     Social History Social History   Tobacco Use   Smoking status: Never   Smokeless tobacco: Never  Vaping Use    Vaping Use: Never used  Substance Use Topics   Alcohol use: No   Drug use: No   Allergies Contrast media [iodinated contrast media], Iohexol, Lipitor [atorvastatin calcium], Shellfish allergy, Sulfa antibiotics, Sulfonamide derivatives, Almond oil, Metrizamide, Zocor [simvastatin], Food, Latex, and Peach [prunus persica]  Review of Systems Review of Systems  Physical Exam Vital Signs  I have reviewed the triage vital signs Pulse (!) 47   Temp 98.2 F (36.8 C)   Resp 14   SpO2 98%  Physical Exam Vitals and nursing note reviewed.  Constitutional:      General: He is not in acute distress.    Appearance: Normal appearance.  HENT:     Head:     Comments: Hematoma to right temporal scalp, no wounds    Mouth/Throat:     Mouth: Mucous membranes are moist.  Cardiovascular:     Comments: Mechanical whirring from LVAD Pulmonary:     Effort: Pulmonary effort is normal. No respiratory distress.     Breath sounds: Normal breath sounds.  Abdominal:     General: Abdomen is flat.     Palpations: Abdomen is soft.     Tenderness: There is no abdominal tenderness.     Comments: LVAD tubing in place  Skin:    General: Skin is warm and dry.     Capillary Refill: Capillary refill takes less than 2 seconds.  Neurological:     Mental Status: He is alert and oriented to person, place, and time. Mental status is at baseline.  Psychiatric:        Mood and Affect: Mood normal.        Behavior: Behavior normal.     ED Results and Treatments Labs (all labs ordered are listed, but only abnormal results are displayed) Labs Reviewed  CBC WITH DIFFERENTIAL/PLATELET - Abnormal; Notable for the following components:      Result Value   Hemoglobin 11.8 (*)    HCT 38.1 (*)    MCH 24.9 (*)    RDW 15.8 (*)    Platelets 138 (*)    All other components within normal limits  PROTIME-INR - Abnormal; Notable for the following components:   Prothrombin Time 18.2 (*)    INR 1.5 (*)    All other  components within normal limits  COMPREHENSIVE METABOLIC PANEL - Abnormal; Notable for the following components:   Glucose, Bld 100 (*)    BUN 36 (*)    Creatinine, Ser 1.81 (*)    GFR, Estimated 43 (*)    All other components within normal limits  BRAIN NATRIURETIC PEPTIDE - Abnormal; Notable for the following components:   B Natriuretic Peptide 738.1 (*)    All other components within normal limits  I-STAT CHEM 8, ED - Abnormal; Notable for the following components:   BUN 35 (*)    Creatinine, Ser 1.90 (*)    Calcium, Ion 1.14 (*)    Hemoglobin 12.2 (*)    HCT 36.0 (*)    All other components within normal limits  TROPONIN I (HIGH SENSITIVITY) - Abnormal; Notable for  the following components:   Troponin I (High Sensitivity) 80 (*)    All other components within normal limits  TYPE AND SCREEN  TROPONIN I (HIGH SENSITIVITY)                                                                                                                          Radiology DG CHEST PORT 1 VIEW  Result Date: 08/12/2022 CLINICAL DATA:  Loss of consciousness. EXAM: PORTABLE CHEST 1 VIEW COMPARISON:  Chest radiograph 04/14/2022. FINDINGS: Left-sided pacemaker remains in place. Left ventricular assist device in place. Prior median sternotomy. Descending aortic stent graft repair. Stable aneurysmal dilatation of the excluded aorta by radiograph.Prosthetic cardiac valve. Chronic cardiomegaly, unchanged. Mild chronic peribronchial thickening. No acute airspace disease, large pleural effusion or pneumothorax. IMPRESSION: 1. Stable cardiomegaly. Left ventricular assist device and left-sided pacemaker remain in place. 2. Stable radiographic appearance of repaired thoracic aortic aneurysm. Electronically Signed   By: Keith Rake M.D.   On: 08/12/2022 00:03   CT Head Wo Contrast  Result Date: 08/11/2022 CLINICAL DATA:  Head trauma, abnormal mental status (Age 49-64y); Neck trauma, dangerous injury mechanism (Age  81-64y) EXAM: CT HEAD WITHOUT CONTRAST CT CERVICAL SPINE WITHOUT CONTRAST TECHNIQUE: Multidetector CT imaging of the head and cervical spine was performed following the standard protocol without intravenous contrast. Multiplanar CT image reconstructions of the cervical spine were also generated. RADIATION DOSE REDUCTION: This exam was performed according to the departmental dose-optimization program which includes automated exposure control, adjustment of the mA and/or kV according to patient size and/or use of iterative reconstruction technique. COMPARISON:  CT head and cervical spine 02/12/2022 FINDINGS: CT HEAD FINDINGS Brain: Normal anatomic configuration. Parenchymal volume loss is commensurate with the patient's age. Remote right cerebellar infarct again noted. No abnormal intra or extra-axial mass lesion or fluid collection. No abnormal mass effect or midline shift. No evidence of acute intracranial hemorrhage or infarct. Ventricular size is normal. Cerebellum unremarkable. Vascular: No asymmetric hyperdense vasculature at the skull base. Skull: Intact Sinuses/Orbits: Paranasal sinuses are clear. Orbits are unremarkable. Other: Mastoid air cells and middle ear cavities are clear. CT CERVICAL SPINE FINDINGS Alignment: 3 mm anterolisthesis C7-T1 is unchanged. Otherwise normal cervical lordosis. Skull base and vertebrae: Craniocervical alignment is normal. The atlantodental interval is not widened. No acute fracture of the cervical spine. Vertebral body height is preserved. Soft tissues and spinal canal: No prevertebral fluid or swelling. No visible canal hematoma. No significant canal stenosis. Left hemithyroidectomy has been performed. Disc levels: There is diffuse intervertebral disc space narrowing and endplate remodeling throughout the cervical spine in keeping with changes of diffuse severe degenerative disc disease. Prevertebral soft tissues are not thickened on sagittal reformats. Multilevel moderate to  severe neuroforaminal narrowing is seen, most severe on the left at C4-5 and C5-6. Upper chest: Thoracic aortic aneurysm sac is partially visualized within the left apex along with small left apical pleural effusion, similar to that noted on prior CT examination of 12/10/2021.  Other: None IMPRESSION: 1. No acute intracranial abnormality. No calvarial fracture. 2. Remote right cerebellar infarct. 3. No acute fracture or listhesis of the cervical spine. Electronically Signed   By: Fidela Salisbury M.D.   On: 08/11/2022 23:06   CT Cervical Spine Wo Contrast  Result Date: 08/11/2022 CLINICAL DATA:  Head trauma, abnormal mental status (Age 102-64y); Neck trauma, dangerous injury mechanism (Age 59-64y) EXAM: CT HEAD WITHOUT CONTRAST CT CERVICAL SPINE WITHOUT CONTRAST TECHNIQUE: Multidetector CT imaging of the head and cervical spine was performed following the standard protocol without intravenous contrast. Multiplanar CT image reconstructions of the cervical spine were also generated. RADIATION DOSE REDUCTION: This exam was performed according to the departmental dose-optimization program which includes automated exposure control, adjustment of the mA and/or kV according to patient size and/or use of iterative reconstruction technique. COMPARISON:  CT head and cervical spine 02/12/2022 FINDINGS: CT HEAD FINDINGS Brain: Normal anatomic configuration. Parenchymal volume loss is commensurate with the patient's age. Remote right cerebellar infarct again noted. No abnormal intra or extra-axial mass lesion or fluid collection. No abnormal mass effect or midline shift. No evidence of acute intracranial hemorrhage or infarct. Ventricular size is normal. Cerebellum unremarkable. Vascular: No asymmetric hyperdense vasculature at the skull base. Skull: Intact Sinuses/Orbits: Paranasal sinuses are clear. Orbits are unremarkable. Other: Mastoid air cells and middle ear cavities are clear. CT CERVICAL SPINE FINDINGS Alignment: 3 mm  anterolisthesis C7-T1 is unchanged. Otherwise normal cervical lordosis. Skull base and vertebrae: Craniocervical alignment is normal. The atlantodental interval is not widened. No acute fracture of the cervical spine. Vertebral body height is preserved. Soft tissues and spinal canal: No prevertebral fluid or swelling. No visible canal hematoma. No significant canal stenosis. Left hemithyroidectomy has been performed. Disc levels: There is diffuse intervertebral disc space narrowing and endplate remodeling throughout the cervical spine in keeping with changes of diffuse severe degenerative disc disease. Prevertebral soft tissues are not thickened on sagittal reformats. Multilevel moderate to severe neuroforaminal narrowing is seen, most severe on the left at C4-5 and C5-6. Upper chest: Thoracic aortic aneurysm sac is partially visualized within the left apex along with small left apical pleural effusion, similar to that noted on prior CT examination of 12/10/2021. Other: None IMPRESSION: 1. No acute intracranial abnormality. No calvarial fracture. 2. Remote right cerebellar infarct. 3. No acute fracture or listhesis of the cervical spine. Electronically Signed   By: Fidela Salisbury M.D.   On: 08/11/2022 23:06    Pertinent labs & imaging results that were available during my care of the patient were reviewed by me and considered in my medical decision making (see MDM for details).  Medications Ordered in ED Medications  aspirin tablet 325 mg (has no administration in time range)  acetaminophen (TYLENOL) tablet 1,000 mg (1,000 mg Oral Given 08/11/22 2227)  fentaNYL (SUBLIMAZE) injection 50 mcg (50 mcg Intravenous Given 08/11/22 2247)  Procedures Procedures  (including critical care time)  Medical Decision Making / ED Course   MDM:  61 year old male presenting to the  emergency department with syncope.  Patient well-appearing, EKG appears similar to prior although significant artifact from LVAD.  No further shocks in the emergency department.  Patient has mild hematoma to the right temporal scalp  CT head negative for intracranial injury.  CT C-spine negative.  Labs notable for mild AKI, elevated troponin although appears similar to last recorded 3 months ago.  Given syncopal event and AICD shock, differential includes malignant arrhythmia, ACS, LVAD dysfunction although LVAD currently appears to be functioning appropriately.  Patient will need admission for further work-up of his syncopal event given his LVAD he is extremely high risk for dangerous causes of syncope.  Will discuss with [facility] transfer center.  Clinical Course as of 08/12/22 0016  Fri Aug 11, 2022  2354 Accepted by Rennie Natter NP, attending Dr. Maryann Conners. Will be transferred to [facility]. Signed out to Dr. Christy Gentles pending transfer.  [WS]    Clinical Course User Index [WS] Cristie Hem, MD     Additional history obtained: -Additional history obtained from EMS -External records from outside source obtained and reviewed including: Chart review including previous notes, labs, imaging, consultation notes   Lab Tests: -I ordered, reviewed, and interpreted labs.   The pertinent results include:   Labs Reviewed  CBC WITH DIFFERENTIAL/PLATELET - Abnormal; Notable for the following components:      Result Value   Hemoglobin 11.8 (*)    HCT 38.1 (*)    MCH 24.9 (*)    RDW 15.8 (*)    Platelets 138 (*)    All other components within normal limits  PROTIME-INR - Abnormal; Notable for the following components:   Prothrombin Time 18.2 (*)    INR 1.5 (*)    All other components within normal limits  COMPREHENSIVE METABOLIC PANEL - Abnormal; Notable for the following components:   Glucose, Bld 100 (*)    BUN 36 (*)    Creatinine, Ser 1.81 (*)    GFR, Estimated 43 (*)     All other components within normal limits  BRAIN NATRIURETIC PEPTIDE - Abnormal; Notable for the following components:   B Natriuretic Peptide 738.1 (*)    All other components within normal limits  I-STAT CHEM 8, ED - Abnormal; Notable for the following components:   BUN 35 (*)    Creatinine, Ser 1.90 (*)    Calcium, Ion 1.14 (*)    Hemoglobin 12.2 (*)    HCT 36.0 (*)    All other components within normal limits  TROPONIN I (HIGH SENSITIVITY) - Abnormal; Notable for the following components:   Troponin I (High Sensitivity) 80 (*)    All other components within normal limits  TYPE AND SCREEN  TROPONIN I (HIGH SENSITIVITY)      EKG   EKG Interpretation  Date/Time:  Friday August 11 2022 22:15:16 EDT Ventricular Rate:  101 PR Interval:  146 QRS Duration: 61 QT Interval:  399 QTC Calculation: 518 R Axis:   -83 Text Interpretation: Poor quality data, interpretation may be affected Sinus tachycardia Biatrial enlargement Inferior infarct, old Appears similar to prior. interpretation significantly limited due to artifact Prolonged QT interval Confirmed by Garnette Gunner [id number]) on 08/11/2022 10:19:58 PM         Imaging Studies ordered: I ordered imaging studies including CT head, CT C-spine, chest x-ray I independently visualized and interpreted imaging.  I agree with the radiologist interpretation   Medicines ordered and prescription drug management: Meds ordered this encounter  Medications   acetaminophen (TYLENOL) tablet 1,000 mg   fentaNYL (SUBLIMAZE) injection 50 mcg   aspirin tablet 325 mg    -I have reviewed the patients home medicines and have made adjustments as needed     Cardiac Monitoring: The patient was maintained on a cardiac monitor.  I personally viewed and interpreted the cardiac monitored which showed an underlying rhythm of: sinus tachycardia    Reevaluation: After the interventions noted above, I reevaluated the patient and found that they  have :stayed the same  Co morbidities that complicate the patient evaluation  Past Medical History:  Diagnosis Date   AICD (automatic cardioverter/defibrillator) present    Anemia    Anginal pain ([place])    Anxiety    Aortic dissection, thoracoabdominal ([place])    7/10: Type I s/p repair   Arthritis    Asthma    CAD (coronary artery disease)    a. s/p CABG 2006;  b. DES to PDA 2011 (cath: Dx not seen, dRCA/PDA tx with DES; S-PDA occluded (culprit), S-Dx occluded, S-RI and OM ok, L-LAD ok   Carotid stenosis    dopplers 2011: 0-39% bilat.   Chest pain syndrome    CHF (congestive heart failure) ([place])    06/14/21 LVAD placemnt [facility]   Chronic bronchitis (HCC)    Chronic lower back pain    Chronic systolic heart failure ([place])    a. 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; the other is hanging on" (11/07/2016)   Dyspnea    patient denies at 12/06/2018 appt    Family history of adverse reaction to anesthesia    "sister hard to wake up"   GERD (gastroesophageal reflux disease)    Gout    Heart murmur    HLD (hyperlipidemia)    HTN (hypertension)    severe   Myocardial infarction ([place])    "many" (11/07/2016)   PONV (postoperative nausea and vomiting)    Sleep apnea    does not use mask    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  Despite best efforts to proofread,  errors can occur which can change the documentation meaning.    Cristie Hem,  MD 08/12/22 1610    Cristie Hem, MD 08/12/22 [phone]

## 2022-08-11 NOTE — ED Triage Notes (Signed)
Pt via [facility] c/o unconscious, started "feeling bad" at movies, went to RR & blacked out, states "heart was doing crazy stuff." Hx of same, pt reports defib fired when pt hit the ground. Pt refused ccollar.  Burning CP en route, EMS denies neuro deficits  [facility] LVAD pt  18LAC, 69mg fentanyl

## 2022-08-12 DIAGNOSIS — I255 Ischemic cardiomyopathy: Secondary | ICD-10-CM | POA: Diagnosis not present

## 2022-08-12 DIAGNOSIS — I252 Old myocardial infarction: Secondary | ICD-10-CM | POA: Diagnosis not present

## 2022-08-12 DIAGNOSIS — I2510 Atherosclerotic heart disease of native coronary artery without angina pectoris: Secondary | ICD-10-CM | POA: Diagnosis not present

## 2022-08-12 DIAGNOSIS — I5023 Acute on chronic systolic (congestive) heart failure: Secondary | ICD-10-CM | POA: Diagnosis not present

## 2022-08-12 DIAGNOSIS — Z9889 Other specified postprocedural states: Secondary | ICD-10-CM | POA: Diagnosis not present

## 2022-08-12 DIAGNOSIS — I509 Heart failure, unspecified: Secondary | ICD-10-CM | POA: Diagnosis not present

## 2022-08-12 DIAGNOSIS — I130 Hypertensive heart and chronic kidney disease with heart failure and stage 1 through stage 4 chronic kidney disease, or unspecified chronic kidney disease: Secondary | ICD-10-CM | POA: Diagnosis not present

## 2022-08-12 DIAGNOSIS — I4901 Ventricular fibrillation: Secondary | ICD-10-CM | POA: Diagnosis not present

## 2022-08-12 DIAGNOSIS — N183 Chronic kidney disease, stage 3 unspecified: Secondary | ICD-10-CM | POA: Diagnosis not present

## 2022-08-12 DIAGNOSIS — Z85850 Personal history of malignant neoplasm of thyroid: Secondary | ICD-10-CM | POA: Diagnosis not present

## 2022-08-12 DIAGNOSIS — S0990XS Unspecified injury of head, sequela: Secondary | ICD-10-CM | POA: Diagnosis not present

## 2022-08-12 DIAGNOSIS — I10 Essential (primary) hypertension: Secondary | ICD-10-CM | POA: Diagnosis not present

## 2022-08-12 DIAGNOSIS — I340 Nonrheumatic mitral (valve) insufficiency: Secondary | ICD-10-CM | POA: Diagnosis not present

## 2022-08-12 DIAGNOSIS — W1839XD Other fall on same level, subsequent encounter: Secondary | ICD-10-CM | POA: Diagnosis not present

## 2022-08-12 DIAGNOSIS — R519 Headache, unspecified: Secondary | ICD-10-CM | POA: Diagnosis not present

## 2022-08-12 DIAGNOSIS — Z79899 Other long term (current) drug therapy: Secondary | ICD-10-CM | POA: Diagnosis not present

## 2022-08-12 DIAGNOSIS — G44309 Post-traumatic headache, unspecified, not intractable: Secondary | ICD-10-CM | POA: Diagnosis not present

## 2022-08-12 DIAGNOSIS — I5A Non-ischemic myocardial injury (non-traumatic): Secondary | ICD-10-CM | POA: Diagnosis not present

## 2022-08-12 DIAGNOSIS — I472 Ventricular tachycardia, unspecified: Secondary | ICD-10-CM | POA: Diagnosis not present

## 2022-08-12 DIAGNOSIS — Z95811 Presence of heart assist device: Secondary | ICD-10-CM | POA: Diagnosis not present

## 2022-08-12 DIAGNOSIS — Z4502 Encounter for adjustment and management of automatic implantable cardiac defibrillator: Secondary | ICD-10-CM | POA: Diagnosis not present

## 2022-08-12 DIAGNOSIS — I25810 Atherosclerosis of coronary artery bypass graft(s) without angina pectoris: Secondary | ICD-10-CM | POA: Diagnosis not present

## 2022-08-12 DIAGNOSIS — I1310 Hypertensive heart and chronic kidney disease without heart failure, with stage 1 through stage 4 chronic kidney disease, or unspecified chronic kidney disease: Secondary | ICD-10-CM | POA: Diagnosis not present

## 2022-08-12 DIAGNOSIS — Z91040 Latex allergy status: Secondary | ICD-10-CM | POA: Diagnosis not present

## 2022-08-12 DIAGNOSIS — S0990XA Unspecified injury of head, initial encounter: Secondary | ICD-10-CM | POA: Diagnosis not present

## 2022-08-12 DIAGNOSIS — Z953 Presence of xenogenic heart valve: Secondary | ICD-10-CM | POA: Diagnosis not present

## 2022-08-12 DIAGNOSIS — I5022 Chronic systolic (congestive) heart failure: Secondary | ICD-10-CM | POA: Diagnosis not present

## 2022-08-12 DIAGNOSIS — R55 Syncope and collapse: Secondary | ICD-10-CM | POA: Diagnosis present

## 2022-08-12 DIAGNOSIS — J45909 Unspecified asthma, uncomplicated: Secondary | ICD-10-CM | POA: Diagnosis not present

## 2022-08-12 DIAGNOSIS — I493 Ventricular premature depolarization: Secondary | ICD-10-CM | POA: Diagnosis not present

## 2022-08-12 LAB — TROPONIN I (HIGH SENSITIVITY): Troponin I (High Sensitivity): 454 ng/L (ref <18)

## 2022-08-12 NOTE — ED Notes (Signed)
Pt & family updated on plan of care, intent to transfer to [facility] for continuation of care. Pt & family voiced understanding, pt provided warm blanket, comfort measures for family.

## 2022-08-12 NOTE — ED Provider Notes (Signed)
[facility] here to take patient to [facility].  He is awake and alert. Patient is safe for transfer   Ripley Fraise, MD 08/12/22 [phone]

## 2022-08-13 DIAGNOSIS — Z95811 Presence of heart assist device: Secondary | ICD-10-CM | POA: Diagnosis not present

## 2022-08-13 DIAGNOSIS — I4901 Ventricular fibrillation: Secondary | ICD-10-CM | POA: Diagnosis not present

## 2022-08-13 DIAGNOSIS — I5022 Chronic systolic (congestive) heart failure: Secondary | ICD-10-CM | POA: Diagnosis not present

## 2022-08-14 ENCOUNTER — Ambulatory Visit (INDEPENDENT_AMBULATORY_CARE_PROVIDER_SITE_OTHER): Payer: Medicaid Other

## 2022-08-14 DIAGNOSIS — I5042 Chronic combined systolic (congestive) and diastolic (congestive) heart failure: Secondary | ICD-10-CM | POA: Diagnosis not present

## 2022-08-14 DIAGNOSIS — I4901 Ventricular fibrillation: Secondary | ICD-10-CM | POA: Diagnosis not present

## 2022-08-14 DIAGNOSIS — Z95811 Presence of heart assist device: Secondary | ICD-10-CM | POA: Diagnosis not present

## 2022-08-14 NOTE — Telephone Encounter (Signed)
Transition Care Management Follow-up Telephone Call Date of discharge and from where: 08/12/2022 from [facility] How have you been since you were released from the hospital? Patient was transferred to [facility] and he is still admitted.  Any questions or concerns? No

## 2022-08-15 DIAGNOSIS — S065X0A Traumatic subdural hemorrhage without loss of consciousness, initial encounter: Secondary | ICD-10-CM | POA: Diagnosis not present

## 2022-08-16 ENCOUNTER — Telehealth: Payer: Self-pay

## 2022-08-16 DIAGNOSIS — Z789 Other specified health status: Secondary | ICD-10-CM

## 2022-08-16 DIAGNOSIS — S065X0A Traumatic subdural hemorrhage without loss of consciousness, initial encounter: Secondary | ICD-10-CM | POA: Diagnosis not present

## 2022-08-16 LAB — CUP PACEART REMOTE DEVICE CHECK
Battery Remaining Longevity: 42 mo
Battery Remaining Percentage: 44 %
Brady Statistic RV Percent Paced: 0 %
Date Time Interrogation Session: 20230819040500
HighPow Impedance: 52 Ohm
Implantable Lead Implant Date: 20171114
Implantable Lead Location: 753860
Implantable Lead Model: 181
Implantable Lead Serial Number: 333496
Implantable Pulse Generator Implant Date: 20131220
Lead Channel Impedance Value: 457 Ohm
Lead Channel Pacing Threshold Amplitude: 0.7 V
Lead Channel Pacing Threshold Pulse Width: 0.4 ms
Lead Channel Setting Pacing Amplitude: 2 V
Lead Channel Setting Pacing Pulse Width: 0.4 ms
Lead Channel Setting Sensing Sensitivity: 0.5 mV
Pulse Gen Serial Number: 124654

## 2022-08-16 NOTE — Telephone Encounter (Signed)
Transition Care Management Unsuccessful Follow-up Telephone Call  Date of discharge and from where:  08/14/2022 from [facility] Inpatient Stay  Attempts:  1st Attempt  Reason for unsuccessful TCM follow-up call:  Left voice message

## 2022-08-17 DIAGNOSIS — M542 Cervicalgia: Secondary | ICD-10-CM | POA: Diagnosis not present

## 2022-08-17 DIAGNOSIS — Z95811 Presence of heart assist device: Secondary | ICD-10-CM | POA: Diagnosis not present

## 2022-08-17 DIAGNOSIS — G894 Chronic pain syndrome: Secondary | ICD-10-CM | POA: Diagnosis not present

## 2022-08-17 DIAGNOSIS — M79662 Pain in left lower leg: Secondary | ICD-10-CM | POA: Diagnosis not present

## 2022-08-17 DIAGNOSIS — M25552 Pain in left hip: Secondary | ICD-10-CM | POA: Diagnosis not present

## 2022-08-17 DIAGNOSIS — M5137 Other intervertebral disc degeneration, lumbosacral region: Secondary | ICD-10-CM | POA: Diagnosis not present

## 2022-08-17 DIAGNOSIS — M545 Low back pain, unspecified: Secondary | ICD-10-CM | POA: Diagnosis not present

## 2022-08-17 DIAGNOSIS — S065X0A Traumatic subdural hemorrhage without loss of consciousness, initial encounter: Secondary | ICD-10-CM | POA: Diagnosis not present

## 2022-08-17 DIAGNOSIS — M79661 Pain in right lower leg: Secondary | ICD-10-CM | POA: Diagnosis not present

## 2022-08-17 DIAGNOSIS — Z7901 Long term (current) use of anticoagulants: Secondary | ICD-10-CM | POA: Diagnosis not present

## 2022-08-17 DIAGNOSIS — M25551 Pain in right hip: Secondary | ICD-10-CM | POA: Diagnosis not present

## 2022-08-17 DIAGNOSIS — M5431 Sciatica, right side: Secondary | ICD-10-CM | POA: Diagnosis not present

## 2022-08-17 DIAGNOSIS — Q249 Congenital malformation of heart, unspecified: Secondary | ICD-10-CM | POA: Diagnosis not present

## 2022-08-17 NOTE — Telephone Encounter (Signed)
Transition Care Management Follow-up Telephone Call Date of discharge and from where: 08/14/2022 from [facility] How have you been since you were released from the hospital? Patient stated that he is feeling okay. Patient stated that he has questions regarding the transplant list that he is on but will contact [facility] about that. He did not have any questions about the inpatient stay. However, he is very concerned about the financial strain that his health has put on his ability to make ends meet. He is very adamant that he did not resource help if he has to doing a lot of the leg work just to be told that he is not eligible or that there are not enough funds to help him. Patient has been through a lot with his health in the last several years and feels "worn out". Patient did agree to the referral but is skeptical that anyone will be able to help him.  Any questions or concerns? No  Items Reviewed: Did the pt receive and understand the discharge instructions provided? Yes  Medications obtained and verified? Yes  Other? No  Any new allergies since your discharge? No  Dietary orders reviewed? No Do you have support at home? Yes   Home Care and Equipment/Supplies: Were home health services ordered? not applicable If so, what is the name of the agency? N/A  Has the agency set up a time to come to the patient's home? not applicable Were any new equipment or medical supplies ordered?  No What is the name of the medical supply agency? N/A Were you able to get the supplies/equipment? not applicable Do you have any questions related to the use of the equipment or supplies? No  Functional Questionnaire: (I = Independent and D = Dependent) ADLs: D on some due to illnesses  Bathing/Dressing- I  Meal Prep- D but can do on his own  Eating- I  Maintaining continence- I  Transferring/Ambulation- I  Managing Meds- I  Follow up appointments reviewed:  PCP Hospital f/u appt confirmed? No    Specialist Hospital f/u appt confirmed? Yes  Scheduled to see [facility] on 08/21/2022 @ 10:15am. Are transportation arrangements needed? No  If their condition worsens, is the pt aware to call PCP or go to the Emergency Dept.? Yes Was the patient provided with contact information for the PCP's office or ED? Yes Was to pt encouraged to call back with questions or concerns? Yes

## 2022-08-18 DIAGNOSIS — S065X0A Traumatic subdural hemorrhage without loss of consciousness, initial encounter: Secondary | ICD-10-CM | POA: Diagnosis not present

## 2022-08-21 ENCOUNTER — Telehealth: Payer: Self-pay

## 2022-08-21 DIAGNOSIS — S065X0A Traumatic subdural hemorrhage without loss of consciousness, initial encounter: Secondary | ICD-10-CM | POA: Diagnosis not present

## 2022-08-21 NOTE — Telephone Encounter (Signed)
   Telephone encounter was:  Successful.  08/21/2022 Name: Chad [name] Record. MRN: [id number] DOB: Feb 27, 1961  [name] Record. is a 61 y.o. year old male who is a primary care patient of Leslie Dales, MD . The community resource team was consulted for assistance with  financial.  Care guide performed the following interventions: Spoke with patien briefly, he was busy and unable to talk and requested that I call him back tomorrow.  Follow Up Plan:   I will call patient back to morrow as requested.  Demico Ploch, AAS Paralegal, [facility] [employer]  [address], [place] [address] ??[email or web address]  ?? [phone]   [email or web address]

## 2022-08-22 ENCOUNTER — Telehealth: Payer: Self-pay

## 2022-08-22 DIAGNOSIS — S065X0A Traumatic subdural hemorrhage without loss of consciousness, initial encounter: Secondary | ICD-10-CM | POA: Diagnosis not present

## 2022-08-22 NOTE — Telephone Encounter (Signed)
   Telephone encounter was:  Unsuccessful.  08/22/2022 Name: Chad [name] Record. MRN: [id number] DOB: Nov 11, 1961  Unsuccessful outbound call made today to assist with:   financial strain.  Outreach Attempt:  2nd Attempt  Unable to leave message, voicemail is full.    [name], AAS Paralegal, [name]  [address], [place] [address] ??[email or web address]  ?? [phone]   [email or web address]

## 2022-08-23 ENCOUNTER — Telehealth: Payer: Self-pay

## 2022-08-23 DIAGNOSIS — M109 Gout, unspecified: Secondary | ICD-10-CM | POA: Diagnosis not present

## 2022-08-23 DIAGNOSIS — N182 Chronic kidney disease, stage 2 (mild): Secondary | ICD-10-CM | POA: Diagnosis not present

## 2022-08-23 DIAGNOSIS — R531 Weakness: Secondary | ICD-10-CM | POA: Diagnosis not present

## 2022-08-23 DIAGNOSIS — I25810 Atherosclerosis of coronary artery bypass graft(s) without angina pectoris: Secondary | ICD-10-CM | POA: Diagnosis not present

## 2022-08-23 DIAGNOSIS — I700 Atherosclerosis of aorta: Secondary | ICD-10-CM | POA: Diagnosis not present

## 2022-08-23 DIAGNOSIS — Z20822 Contact with and (suspected) exposure to covid-19: Secondary | ICD-10-CM | POA: Diagnosis not present

## 2022-08-23 DIAGNOSIS — E785 Hyperlipidemia, unspecified: Secondary | ICD-10-CM | POA: Diagnosis not present

## 2022-08-23 DIAGNOSIS — M47814 Spondylosis without myelopathy or radiculopathy, thoracic region: Secondary | ICD-10-CM | POA: Diagnosis not present

## 2022-08-23 DIAGNOSIS — R519 Headache, unspecified: Secondary | ICD-10-CM | POA: Diagnosis not present

## 2022-08-23 DIAGNOSIS — J811 Chronic pulmonary edema: Secondary | ICD-10-CM | POA: Diagnosis not present

## 2022-08-23 DIAGNOSIS — I5022 Chronic systolic (congestive) heart failure: Secondary | ICD-10-CM | POA: Diagnosis not present

## 2022-08-23 DIAGNOSIS — I255 Ischemic cardiomyopathy: Secondary | ICD-10-CM | POA: Diagnosis not present

## 2022-08-23 DIAGNOSIS — N179 Acute kidney failure, unspecified: Secondary | ICD-10-CM | POA: Diagnosis not present

## 2022-08-23 DIAGNOSIS — S065X0A Traumatic subdural hemorrhage without loss of consciousness, initial encounter: Secondary | ICD-10-CM | POA: Diagnosis not present

## 2022-08-23 DIAGNOSIS — J45909 Unspecified asthma, uncomplicated: Secondary | ICD-10-CM | POA: Diagnosis not present

## 2022-08-23 DIAGNOSIS — I130 Hypertensive heart and chronic kidney disease with heart failure and stage 1 through stage 4 chronic kidney disease, or unspecified chronic kidney disease: Secondary | ICD-10-CM | POA: Diagnosis not present

## 2022-08-23 DIAGNOSIS — R5383 Other fatigue: Secondary | ICD-10-CM | POA: Diagnosis not present

## 2022-08-23 DIAGNOSIS — E8770 Fluid overload, unspecified: Secondary | ICD-10-CM | POA: Diagnosis not present

## 2022-08-23 DIAGNOSIS — K219 Gastro-esophageal reflux disease without esophagitis: Secondary | ICD-10-CM | POA: Diagnosis not present

## 2022-08-23 DIAGNOSIS — R251 Tremor, unspecified: Secondary | ICD-10-CM | POA: Diagnosis not present

## 2022-08-23 NOTE — Telephone Encounter (Signed)
   Telephone encounter was:  Successful.  08/23/2022 Name: Chad [name]. MRN: [id number] DOB: Jun 04, 1961  Chad [name]. is a 61 y.o. year old male who is a primary care patient of Leslie Dales, MD . The community resource team was consulted for assistance with Financial Difficulties related to living expenses.  Care guide performed the following interventions: Spoke with patient about [facility] [employer], [employer] [place] [place] and [employer].  Patient was under the impression we could guarantee approval without him having to apply.  I suggested that he may want to call the [employer] [place] he agreed to call them this week. Patient received the email and has my contact information. Letter saved in Epic.  Follow Up Plan:  No further follow up planned at this time. The patient has been provided with needed resources.  Chad [name], AAS Paralegal, [place] [employer]  [address], [place] [address] ??[email or web address]  ?? [phone]   [email or web address]

## 2022-08-24 DIAGNOSIS — Z95810 Presence of automatic (implantable) cardiac defibrillator: Secondary | ICD-10-CM | POA: Diagnosis not present

## 2022-08-24 DIAGNOSIS — Z7901 Long term (current) use of anticoagulants: Secondary | ICD-10-CM | POA: Diagnosis not present

## 2022-08-24 DIAGNOSIS — W1839XA Other fall on same level, initial encounter: Secondary | ICD-10-CM | POA: Diagnosis not present

## 2022-08-24 DIAGNOSIS — Z8679 Personal history of other diseases of the circulatory system: Secondary | ICD-10-CM | POA: Diagnosis not present

## 2022-08-24 DIAGNOSIS — I10 Essential (primary) hypertension: Secondary | ICD-10-CM | POA: Diagnosis not present

## 2022-08-24 DIAGNOSIS — N179 Acute kidney failure, unspecified: Secondary | ICD-10-CM | POA: Diagnosis not present

## 2022-08-24 DIAGNOSIS — T462X5A Adverse effect of other antidysrhythmic drugs, initial encounter: Secondary | ICD-10-CM | POA: Diagnosis not present

## 2022-08-24 DIAGNOSIS — S065XAA Traumatic subdural hemorrhage with loss of consciousness status unknown, initial encounter: Secondary | ICD-10-CM | POA: Diagnosis not present

## 2022-08-24 DIAGNOSIS — G44329 Chronic post-traumatic headache, not intractable: Secondary | ICD-10-CM | POA: Diagnosis not present

## 2022-08-24 DIAGNOSIS — Z95811 Presence of heart assist device: Secondary | ICD-10-CM | POA: Diagnosis not present

## 2022-08-24 DIAGNOSIS — I255 Ischemic cardiomyopathy: Secondary | ICD-10-CM | POA: Diagnosis not present

## 2022-08-25 DIAGNOSIS — I255 Ischemic cardiomyopathy: Secondary | ICD-10-CM | POA: Diagnosis not present

## 2022-08-25 DIAGNOSIS — S065XAA Traumatic subdural hemorrhage with loss of consciousness status unknown, initial encounter: Secondary | ICD-10-CM | POA: Diagnosis not present

## 2022-08-25 DIAGNOSIS — I10 Essential (primary) hypertension: Secondary | ICD-10-CM | POA: Diagnosis not present

## 2022-08-25 DIAGNOSIS — N179 Acute kidney failure, unspecified: Secondary | ICD-10-CM | POA: Diagnosis not present

## 2022-08-25 DIAGNOSIS — T462X5A Adverse effect of other antidysrhythmic drugs, initial encounter: Secondary | ICD-10-CM | POA: Diagnosis not present

## 2022-08-25 DIAGNOSIS — Z419 Encounter for procedure for purposes other than remedying health state, unspecified: Secondary | ICD-10-CM | POA: Diagnosis not present

## 2022-08-25 DIAGNOSIS — Z8679 Personal history of other diseases of the circulatory system: Secondary | ICD-10-CM | POA: Diagnosis not present

## 2022-08-25 DIAGNOSIS — Z95810 Presence of automatic (implantable) cardiac defibrillator: Secondary | ICD-10-CM | POA: Diagnosis not present

## 2022-08-25 DIAGNOSIS — G44329 Chronic post-traumatic headache, not intractable: Secondary | ICD-10-CM | POA: Diagnosis not present

## 2022-08-25 DIAGNOSIS — Z7901 Long term (current) use of anticoagulants: Secondary | ICD-10-CM | POA: Diagnosis not present

## 2022-08-25 DIAGNOSIS — Z95811 Presence of heart assist device: Secondary | ICD-10-CM | POA: Diagnosis not present

## 2022-08-25 DIAGNOSIS — W1839XA Other fall on same level, initial encounter: Secondary | ICD-10-CM | POA: Diagnosis not present

## 2022-08-28 DIAGNOSIS — S065X0A Traumatic subdural hemorrhage without loss of consciousness, initial encounter: Secondary | ICD-10-CM | POA: Diagnosis not present

## 2022-08-29 DIAGNOSIS — S065X0A Traumatic subdural hemorrhage without loss of consciousness, initial encounter: Secondary | ICD-10-CM | POA: Diagnosis not present

## 2022-08-30 DIAGNOSIS — S065X0A Traumatic subdural hemorrhage without loss of consciousness, initial encounter: Secondary | ICD-10-CM | POA: Diagnosis not present

## 2022-09-01 DIAGNOSIS — Z7901 Long term (current) use of anticoagulants: Secondary | ICD-10-CM | POA: Diagnosis not present

## 2022-09-01 DIAGNOSIS — Z95811 Presence of heart assist device: Secondary | ICD-10-CM | POA: Diagnosis not present

## 2022-09-01 DIAGNOSIS — S065X0A Traumatic subdural hemorrhage without loss of consciousness, initial encounter: Secondary | ICD-10-CM | POA: Diagnosis not present

## 2022-09-01 DIAGNOSIS — N179 Acute kidney failure, unspecified: Secondary | ICD-10-CM | POA: Diagnosis not present

## 2022-09-01 IMAGING — DX DG CHEST 1V PORT
1 series · 1 of 1 positions shown · non-contrast
Comparison: Chest radiograph dated 02/12/2022.

CLINICAL DATA: Chest pain.

EXAM:
PORTABLE CHEST 1 VIEW

[chest]
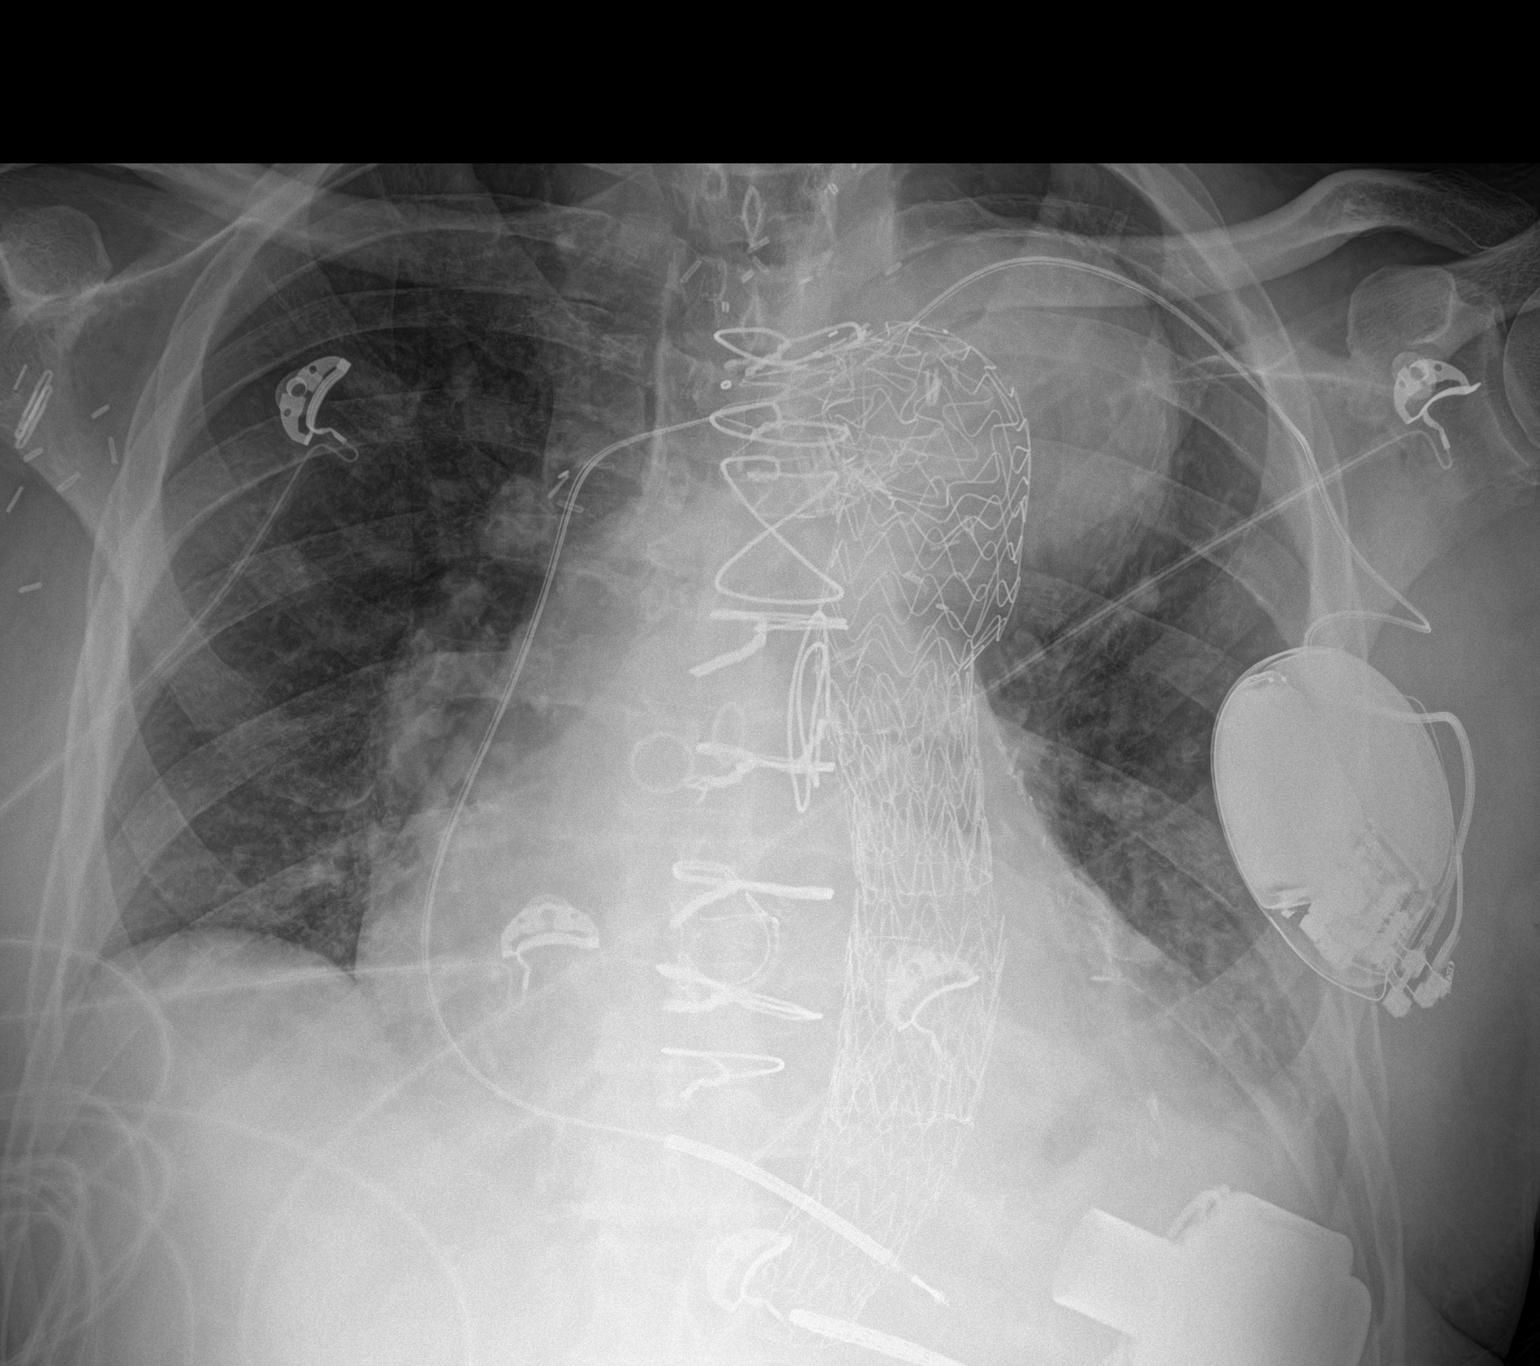

[1 of 1 positions shown; findings below may reference images not displayed]

FINDINGS: Small left pleural effusion and left lung base atelectasis or
infiltrate. No pneumothorax. Stable cardiomegaly. Median sternotomy
wires and mechanical cardiac valve. Aneurysmal dilatation of the
aortic arch status post endovascular stent graft repair similar to
prior radiograph. LVAD and left pectoral pacemaker device. No acute
osseous pathology.
IMPRESSION: 1. Small left pleural effusion and left lung base atelectasis or
infiltrate.
2. Stable cardiomegaly and aneurysmal dilatation of the aortic arch
status post endovascular stent graft repair.

## 2022-09-02 DIAGNOSIS — R69 Illness, unspecified: Secondary | ICD-10-CM | POA: Diagnosis not present

## 2022-09-04 DIAGNOSIS — S065X0A Traumatic subdural hemorrhage without loss of consciousness, initial encounter: Secondary | ICD-10-CM | POA: Diagnosis not present

## 2022-09-05 ENCOUNTER — Other Ambulatory Visit (HOSPITAL_COMMUNITY): Payer: Self-pay | Admitting: Internal Medicine

## 2022-09-05 DIAGNOSIS — I5042 Chronic combined systolic (congestive) and diastolic (congestive) heart failure: Secondary | ICD-10-CM

## 2022-09-05 DIAGNOSIS — Z95811 Presence of heart assist device: Secondary | ICD-10-CM

## 2022-09-05 DIAGNOSIS — S065X0A Traumatic subdural hemorrhage without loss of consciousness, initial encounter: Secondary | ICD-10-CM | POA: Diagnosis not present

## 2022-09-06 DIAGNOSIS — S065X0A Traumatic subdural hemorrhage without loss of consciousness, initial encounter: Secondary | ICD-10-CM | POA: Diagnosis not present

## 2022-09-07 DIAGNOSIS — S065X0A Traumatic subdural hemorrhage without loss of consciousness, initial encounter: Secondary | ICD-10-CM | POA: Diagnosis not present

## 2022-09-08 DIAGNOSIS — S065X0A Traumatic subdural hemorrhage without loss of consciousness, initial encounter: Secondary | ICD-10-CM | POA: Diagnosis not present

## 2022-09-10 NOTE — Progress Notes (Signed)
Remote ICD transmission.   

## 2022-09-11 DIAGNOSIS — S065X0A Traumatic subdural hemorrhage without loss of consciousness, initial encounter: Secondary | ICD-10-CM | POA: Diagnosis not present

## 2022-09-12 DIAGNOSIS — S065X0A Traumatic subdural hemorrhage without loss of consciousness, initial encounter: Secondary | ICD-10-CM | POA: Diagnosis not present

## 2022-09-13 DIAGNOSIS — Z95811 Presence of heart assist device: Secondary | ICD-10-CM | POA: Diagnosis not present

## 2022-09-13 DIAGNOSIS — Z7901 Long term (current) use of anticoagulants: Secondary | ICD-10-CM | POA: Diagnosis not present

## 2022-09-13 DIAGNOSIS — S065X0A Traumatic subdural hemorrhage without loss of consciousness, initial encounter: Secondary | ICD-10-CM | POA: Diagnosis not present

## 2022-09-14 DIAGNOSIS — S065X0A Traumatic subdural hemorrhage without loss of consciousness, initial encounter: Secondary | ICD-10-CM | POA: Diagnosis not present

## 2022-09-15 DIAGNOSIS — S065X0A Traumatic subdural hemorrhage without loss of consciousness, initial encounter: Secondary | ICD-10-CM | POA: Diagnosis not present

## 2022-09-18 DIAGNOSIS — Z23 Encounter for immunization: Secondary | ICD-10-CM | POA: Diagnosis not present

## 2022-09-18 DIAGNOSIS — Z95811 Presence of heart assist device: Secondary | ICD-10-CM | POA: Diagnosis not present

## 2022-09-18 DIAGNOSIS — E875 Hyperkalemia: Secondary | ICD-10-CM | POA: Diagnosis not present

## 2022-09-18 DIAGNOSIS — I5023 Acute on chronic systolic (congestive) heart failure: Secondary | ICD-10-CM | POA: Diagnosis not present

## 2022-09-18 DIAGNOSIS — S065X0A Traumatic subdural hemorrhage without loss of consciousness, initial encounter: Secondary | ICD-10-CM | POA: Diagnosis not present

## 2022-09-18 DIAGNOSIS — N1832 Chronic kidney disease, stage 3b: Secondary | ICD-10-CM | POA: Diagnosis not present

## 2022-09-19 DIAGNOSIS — S065X0A Traumatic subdural hemorrhage without loss of consciousness, initial encounter: Secondary | ICD-10-CM | POA: Diagnosis not present

## 2022-09-20 DIAGNOSIS — S065X0A Traumatic subdural hemorrhage without loss of consciousness, initial encounter: Secondary | ICD-10-CM | POA: Diagnosis not present

## 2022-09-21 DIAGNOSIS — M79662 Pain in left lower leg: Secondary | ICD-10-CM | POA: Diagnosis not present

## 2022-09-21 DIAGNOSIS — M545 Low back pain, unspecified: Secondary | ICD-10-CM | POA: Diagnosis not present

## 2022-09-21 DIAGNOSIS — M25552 Pain in left hip: Secondary | ICD-10-CM | POA: Diagnosis not present

## 2022-09-21 DIAGNOSIS — G894 Chronic pain syndrome: Secondary | ICD-10-CM | POA: Diagnosis not present

## 2022-09-21 DIAGNOSIS — M25551 Pain in right hip: Secondary | ICD-10-CM | POA: Diagnosis not present

## 2022-09-21 DIAGNOSIS — M5431 Sciatica, right side: Secondary | ICD-10-CM | POA: Diagnosis not present

## 2022-09-21 DIAGNOSIS — M79661 Pain in right lower leg: Secondary | ICD-10-CM | POA: Diagnosis not present

## 2022-09-21 DIAGNOSIS — Q249 Congenital malformation of heart, unspecified: Secondary | ICD-10-CM | POA: Diagnosis not present

## 2022-09-21 DIAGNOSIS — M542 Cervicalgia: Secondary | ICD-10-CM | POA: Diagnosis not present

## 2022-09-21 DIAGNOSIS — G890 Central pain syndrome: Secondary | ICD-10-CM | POA: Diagnosis not present

## 2022-09-21 DIAGNOSIS — S065X0A Traumatic subdural hemorrhage without loss of consciousness, initial encounter: Secondary | ICD-10-CM | POA: Diagnosis not present

## 2022-09-22 DIAGNOSIS — S065X0A Traumatic subdural hemorrhage without loss of consciousness, initial encounter: Secondary | ICD-10-CM | POA: Diagnosis not present

## 2022-09-24 DIAGNOSIS — Z419 Encounter for procedure for purposes other than remedying health state, unspecified: Secondary | ICD-10-CM | POA: Diagnosis not present

## 2022-09-25 DIAGNOSIS — S065X0A Traumatic subdural hemorrhage without loss of consciousness, initial encounter: Secondary | ICD-10-CM | POA: Diagnosis not present

## 2022-09-26 DIAGNOSIS — S065X0A Traumatic subdural hemorrhage without loss of consciousness, initial encounter: Secondary | ICD-10-CM | POA: Diagnosis not present

## 2022-09-27 DIAGNOSIS — S065X0A Traumatic subdural hemorrhage without loss of consciousness, initial encounter: Secondary | ICD-10-CM | POA: Diagnosis not present

## 2022-09-28 DIAGNOSIS — S065X0A Traumatic subdural hemorrhage without loss of consciousness, initial encounter: Secondary | ICD-10-CM | POA: Diagnosis not present

## 2022-09-29 DIAGNOSIS — S065X0A Traumatic subdural hemorrhage without loss of consciousness, initial encounter: Secondary | ICD-10-CM | POA: Diagnosis not present

## 2022-10-02 DIAGNOSIS — S065X0A Traumatic subdural hemorrhage without loss of consciousness, initial encounter: Secondary | ICD-10-CM | POA: Diagnosis not present

## 2022-10-02 DIAGNOSIS — Z7901 Long term (current) use of anticoagulants: Secondary | ICD-10-CM | POA: Diagnosis not present

## 2022-10-02 DIAGNOSIS — N1832 Chronic kidney disease, stage 3b: Secondary | ICD-10-CM | POA: Diagnosis not present

## 2022-10-02 DIAGNOSIS — Z95811 Presence of heart assist device: Secondary | ICD-10-CM | POA: Diagnosis not present

## 2022-10-03 DIAGNOSIS — S065X0A Traumatic subdural hemorrhage without loss of consciousness, initial encounter: Secondary | ICD-10-CM | POA: Diagnosis not present

## 2022-10-04 DIAGNOSIS — S065X0A Traumatic subdural hemorrhage without loss of consciousness, initial encounter: Secondary | ICD-10-CM | POA: Diagnosis not present

## 2022-10-05 ENCOUNTER — Other Ambulatory Visit (HOSPITAL_COMMUNITY): Payer: Self-pay

## 2022-10-05 DIAGNOSIS — S065X0A Traumatic subdural hemorrhage without loss of consciousness, initial encounter: Secondary | ICD-10-CM | POA: Diagnosis not present

## 2022-10-05 DIAGNOSIS — I5042 Chronic combined systolic (congestive) and diastolic (congestive) heart failure: Secondary | ICD-10-CM

## 2022-10-05 DIAGNOSIS — Z95811 Presence of heart assist device: Secondary | ICD-10-CM

## 2022-10-05 MED ORDER — CARVEDILOL 6.25 MG PO TABS: 6.2500 mg | ORAL_TABLET | Freq: Two times a day (BID) | ORAL | 1 refills | Status: DC

## 2022-10-06 DIAGNOSIS — S065X0A Traumatic subdural hemorrhage without loss of consciousness, initial encounter: Secondary | ICD-10-CM | POA: Diagnosis not present

## 2022-10-22 ENCOUNTER — Other Ambulatory Visit (HOSPITAL_COMMUNITY): Payer: Self-pay | Admitting: Internal Medicine

## 2022-10-22 DIAGNOSIS — I5042 Chronic combined systolic (congestive) and diastolic (congestive) heart failure: Secondary | ICD-10-CM

## 2022-10-22 DIAGNOSIS — Z95811 Presence of heart assist device: Secondary | ICD-10-CM

## 2022-11-06 ENCOUNTER — Ambulatory Visit (HOSPITAL_COMMUNITY)
Admission: RE | Admit: 2022-11-06 | Discharge: 2022-11-06 | Disposition: A | Discharge status: Home / Self Care | Payer: Medicare Other | Source: Ambulatory Visit | Attending: Cardiology | Admitting: Cardiology

## 2022-11-06 VITALS — BP 86/0 | HR 66 | Ht 71.0 in | Wt 194.6 lb

## 2022-11-06 DIAGNOSIS — I10 Essential (primary) hypertension: Secondary | ICD-10-CM

## 2022-11-06 DIAGNOSIS — Z95811 Presence of heart assist device: Secondary | ICD-10-CM | POA: Insufficient documentation

## 2022-11-06 DIAGNOSIS — I255 Ischemic cardiomyopathy: Secondary | ICD-10-CM | POA: Diagnosis not present

## 2022-11-06 DIAGNOSIS — I5022 Chronic systolic (congestive) heart failure: Secondary | ICD-10-CM | POA: Diagnosis not present

## 2022-11-06 DIAGNOSIS — Z4801 Encounter for change or removal of surgical wound dressing: Secondary | ICD-10-CM | POA: Insufficient documentation

## 2022-11-06 DIAGNOSIS — Z951 Presence of aortocoronary bypass graft: Secondary | ICD-10-CM | POA: Diagnosis not present

## 2022-11-06 DIAGNOSIS — N189 Chronic kidney disease, unspecified: Secondary | ICD-10-CM | POA: Diagnosis not present

## 2022-11-06 DIAGNOSIS — I2510 Atherosclerotic heart disease of native coronary artery without angina pectoris: Secondary | ICD-10-CM | POA: Diagnosis not present

## 2022-11-06 DIAGNOSIS — Z95810 Presence of automatic (implantable) cardiac defibrillator: Secondary | ICD-10-CM | POA: Insufficient documentation

## 2022-11-06 DIAGNOSIS — Z7984 Long term (current) use of oral hypoglycemic drugs: Secondary | ICD-10-CM | POA: Diagnosis not present

## 2022-11-06 DIAGNOSIS — Z8774 Personal history of (corrected) congenital malformations of heart and circulatory system: Secondary | ICD-10-CM | POA: Insufficient documentation

## 2022-11-06 DIAGNOSIS — S065XAA Traumatic subdural hemorrhage with loss of consciousness status unknown, initial encounter: Secondary | ICD-10-CM

## 2022-11-06 DIAGNOSIS — Z79899 Other long term (current) drug therapy: Secondary | ICD-10-CM | POA: Insufficient documentation

## 2022-11-06 DIAGNOSIS — I130 Hypertensive heart and chronic kidney disease with heart failure and stage 1 through stage 4 chronic kidney disease, or unspecified chronic kidney disease: Secondary | ICD-10-CM | POA: Diagnosis not present

## 2022-11-06 DIAGNOSIS — Z7901 Long term (current) use of anticoagulants: Secondary | ICD-10-CM | POA: Diagnosis not present

## 2022-11-06 DIAGNOSIS — N182 Chronic kidney disease, stage 2 (mild): Secondary | ICD-10-CM

## 2022-11-06 DIAGNOSIS — I252 Old myocardial infarction: Secondary | ICD-10-CM | POA: Insufficient documentation

## 2022-11-06 LAB — BASIC METABOLIC PANEL
Anion gap: 8 (ref 5–15)
BUN: 22 mg/dL — ABNORMAL HIGH (ref 6–20)
CO2: 31 mmol/L (ref 22–32)
Calcium: 9.1 mg/dL (ref 8.9–10.3)
Chloride: 101 mmol/L (ref 98–111)
Creatinine, Ser: 1.84 mg/dL — ABNORMAL HIGH (ref 0.61–1.24)
GFR, Estimated: 42 mL/min — ABNORMAL LOW (ref >60)
Glucose, Bld: 104 mg/dL — ABNORMAL HIGH (ref 70–99)
Potassium: 3.8 mmol/L (ref 3.5–5.1)
Sodium: 140 mmol/L (ref 135–145)

## 2022-11-06 LAB — CBC
HCT: 36.7 % — ABNORMAL LOW (ref 39.0–52.0)
Hemoglobin: 11.3 g/dL — ABNORMAL LOW (ref 13.0–17.0)
MCH: 25.6 pg — ABNORMAL LOW (ref 26.0–34.0)
MCHC: 30.8 g/dL (ref 30.0–36.0)
MCV: 83 fL (ref 80.0–100.0)
Platelets: 125 10*3/uL — ABNORMAL LOW (ref 150–400)
RBC: 4.42 MIL/uL (ref 4.22–5.81)
RDW: 16.8 % — ABNORMAL HIGH (ref 11.5–15.5)
WBC: 3 10*3/uL — ABNORMAL LOW (ref 4.0–10.5)
nRBC: 0 % (ref 0.0–0.2)

## 2022-11-06 LAB — LACTATE DEHYDROGENASE: LDH: 135 U/L (ref 98–192)

## 2022-11-06 LAB — PROTIME-INR
INR: 1.5 — ABNORMAL HIGH (ref 0.8–1.2)
Prothrombin Time: 18.3 seconds — ABNORMAL HIGH (ref 11.4–15.2)

## 2022-11-06 LAB — MAGNESIUM: Magnesium: 2.6 mg/dL — ABNORMAL HIGH (ref 1.7–2.4)

## 2022-11-06 NOTE — Progress Notes (Signed)
Advanced Heart Failure Clinic Note   Patient ID: Chad Bark., male   DOB: 1961-03-30, 61 y.o.   MRN: [id number]   Primary Cardiologist:  Dr. Glori Bickers   History of Present Illness:  Chad [name] is a 61 y.o. male with aevere HTN, CAD s/p MI with CABG 2006 and DES to native PDA in 4742, systolic HF EF 59-56% s/p BOsSCI ICD. 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  Underwent high risk HM-III VAD placement at [facility] 6/21.   Echo 3/22 EF 20% RV moderate HK.  RHC with exercise 3/22 on 5600  Rest RA 9 PCWP 12 CI 2.4 MAP 100 Exercise PCWP 27 CI 2.5 pVO2 7.4 slope 42   Here for f/u and shared care visit. Had multiple admissions to [facility] b/w  Feb - August 2023. Initially seen for SDH with midline shift due to falls  Pt was admitted again in August for VF and cardioverision.    Pt is now taking Amiodarone 200 daily for the VF episode. Pt is in NSR today in the 60s.Was removed from the heart transplant list a few months ago d/t vascular issues.  Was seen at [facility] ED over the weekend for nausea but discharged from ED. Says he is doing much better. No further VT or ICD shocks. Able to do all ADLs without problem. MAPs labile and takes extra hydralazine as needed. Denies orthopnea or PND. No fevers, chills or problems with driveline. No bleeding, melena or neuro symptoms. No VAD alarms. Taking all meds as prescribed.      VAD Indication: Destination Therapy - HM III implanted at [facility] 06/04/20   VAD interrogation: Speed: 5600 Flow: 4.6 Power: 4.7w    PI: 3.7 Alarms: none Events: 20 - 30 PI events over last two days; 60+ on 12/25/20 Hct 34   Fixed speed: 5800 Low speed limit: 5600 - set by [facility]   I reviewed the LVAD parameters from today and compared the results to  the patient's prior recorded data. LVAD interrogation was NEGATIVE for significant power changes, NEGATIVE for clinical alarms and POSITIVE for PI events/speed drops. No programming changes were made and pump is functioning within specified parameters. Pt is performing daily controller and system monitor self tests along with completing weekly and monthly maintenance for LVAD equipment.   LVAD equipment check completed and is in good working order. Back-up equipment present.   Annual Equipment Maintenance on UBC/PM maintained by [facility].      Review of systems complete and found to be negative unless listed in HPI.   Past Medical History:  Diagnosis Date   AICD (automatic cardioverter/defibrillator) present    Anemia    Anginal pain ([place])    Anxiety    Aortic dissection, thoracoabdominal ([place])    7/10: Type I s/p repair   Arthritis    Asthma    CAD (coronary artery disease)    a. s/p CABG 2006;  b. DES to PDA 2011 (cath: Dx not seen, dRCA/PDA tx with DES; S-PDA occluded (culprit), S-Dx occluded, S-RI and OM ok, L-LAD ok   Carotid stenosis    dopplers 2011: 0-39% bilat.   Chest pain syndrome    CHF (congestive heart failure) ([place])    06/14/21 LVAD placemnt [facility]   Chronic bronchitis ([place])  Chronic lower back pain    Chronic systolic heart failure ([place])    a. 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; the other is hanging on" (11/07/2016)   Dyspnea    patient denies at 12/06/2018 appt    Family history of adverse reaction to anesthesia    "sister hard to wake up"   GERD (gastroesophageal reflux disease)    Gout    Heart murmur    HLD (hyperlipidemia)    HTN (hypertension)    severe   Myocardial infarction [facility])     "many" (11/07/2016)   PONV (postoperative nausea and vomiting)    Sleep apnea    does not use mask    Thyroid cancer ([facility])    Hertle Cell    Current Outpatient Medications  Medication Sig Dispense Refill   acetaminophen (TYLENOL) 325 MG tablet Take 2 tablets (650 mg total) by mouth every 4 (four) hours as needed for headache or mild pain.     allopurinol (ZYLOPRIM) 100 MG tablet Take 1 tablet by mouth once daily 30 tablet 6   amiodarone (PACERONE) 200 MG tablet Take 200 mg by mouth daily.     amLODipine (NORVASC) 5 MG tablet Take 5 mg by mouth daily.     carvedilol (COREG) 6.25 MG tablet Take 1 tablet (6.25 mg total) by mouth 2 (two) times daily with a meal. If systolic is less than 90 you may hold Coreg until systolic is greater than 90, then resume taking **Needs follow up appointment** 60 tablet 1   colchicine 0.6 MG tablet Take 1 tablet (0.6 mg total) by mouth daily. 30 tablet 11   FARXIGA 5 MG TABS tablet Take 5 mg by mouth every morning.     ferrous sulfate 325 (65 FE) MG tablet Take 325 mg by mouth daily with breakfast.     fluticasone (FLONASE) 50 MCG/ACT nasal spray Place 2 sprays into both nostrils daily as needed for allergies or rhinitis.      gabapentin (NEURONTIN) 300 MG capsule Take 1 capsule (300 mg total) by mouth at bedtime. (Patient taking differently: Take 300 mg by mouth 3 (three) times daily.) 30 capsule 3   hydrALAZINE (APRESOLINE) 100 MG tablet Take 50 mg by mouth 3 (three) times daily.     isosorbide mononitrate (IMDUR) 30 MG 24 hr tablet Take 30 mg by mouth daily.     Oxycodone HCl 20 MG TABS Take 20 mg by mouth every 6 (six) hours.     potassium chloride (KLOR-CON) 10 MEQ tablet Take 10 mEq by mouth daily as needed. Only take 1 if you take Torsemide     sacubitril-valsartan (ENTRESTO) 49-51 MG Take 1 tablet by mouth 2 (two) times daily.     torsemide (DEMADEX) 20 MG tablet Take 20 mg by mouth daily as needed.     warfarin (COUMADIN) 4 MG tablet Take 4 mg by  mouth at bedtime.     naloxone (NARCAN) nasal spray 4 mg/0.1 mL Place 1 spray into the nose as needed (accidental overdose). (Patient not taking: Reported on 11/06/2022)     nitroGLYCERIN (NITROSTAT) 0.4 MG SL tablet DISSOLVE ONE TABLET UNDER THE TONGUE EVERY 5  MINUTES AS NEEDED FOR CHEST PAIN. MAY  TAKE  IF  DIASTOLIC  BP  IS  GREATER  THAN  90 (Patient not taking: Reported on 11/06/2022) 25 tablet 0   No current facility-administered medications for this encounter.    Allergies  Allergen Reactions   Contrast Media [Iodinated Contrast Media] Anaphylaxis   Iohexol Anaphylaxis and Other (See Comments)    PT HAS ANAPHYLAXIS WITH CONTRAST MEDIA!   Lipitor [Atorvastatin Calcium] Anaphylaxis and Other (See Comments)    Large doses   Shellfish Allergy Anaphylaxis   Sulfa Antibiotics Shortness Of Breath and Swelling   Sulfonamide Derivatives Shortness Of Breath and Swelling   Almond Oil Itching    FACIAL/MOUTH ITCHING   Metrizamide Swelling    SWELLING REACTION UNSPECIFIED    Zocor [Simvastatin] Other (See Comments)    Muscle cramps   Food Itching    Shellfish, peaches, almonds, apples & kiwis   Latex Rash and Other (See Comments)    With long periods of exposure   Peach [Prunus Persica] Itching    Vital Signs:  Wt Readings from Last 3 Encounters:  05/31/22 84.6 kg (186 lb 8 oz)  02/12/22 85.8 kg (189 lb 2.5 oz)  12/27/21 85.8 kg (189 lb 3.2 oz)      Vital Signs:  Doppler Pressure: 86 Automatc BP: 113/63 (95) HR: 66 SR  SPO2: UTO   Weight: 194.6 lb w/ eqt Last weight: 189.2 lb w/ eqt  Home weights: does not weigh daily   Exam: General:  NAD.  HEENT: normal  Neck: supple. JVP not elevated.  Carotids 2+ bilat; no bruits. No lymphadenopathy or thryomegaly appreciated. Cor: LVAD hum.  Lungs: Clear. Abdomen: soft, nontender, non-distended. No hepatosplenomegaly. No bruits or masses. Good bowel sounds. Driveline site clean. Anchor in place.  Extremities: no cyanosis,  clubbing, rash. Warm no edema  Neuro: alert & oriented x 3. No focal deficits. Moves all 4 without problem    ASSESSMENT/PLAN:  1. Chronic systolic HF: Ischemic cardiomyopathy.  Echo (2/16) with EF 25%. S/p [employer] ICD. Echo 3/18. EF 20-25%. Echo 04/18/18: EF 20-25%, grade 2 DD. - s/p HM-III VAD at [facility] 6/21 - Echo and RHC at [facility] 3/22 (details above) - Stable NYHA II-III with VAD support - Volume status looks good, Continue current diuretic regimen - Continue Entresto 49/51 bid - Continue spiro 25 daily - Continue Farxiga 5 daily - Continue carvedilol 6.25 bid  - Continue hydral 50 tid and Imdur 30 daily (he adjusts dose as needed based on BP) - Currently removed from transplant list.   2. CAD s/p CABG 2006:  - has occasional atypical CP.  - no current s/s angina  3. 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 S/p embolization - now stable  4. 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 VAD management - VAD interrogated personally. Parameters stable. - DL site ok - LDH 135 - Hgb 11.3 - INR managed by [facility]. Goal now 1.5-2.0 (INR goal had been high with thrombosed AoV but goal reduced with SDH)  6 CKD 38 in setting of solitary kidney s/p dissection - Continue ARNI and Farxiga - creatinine stable at 1.8 today  7. HTN:  - Blood pressure well controlled. Continue current regimen. - Continue prn hydralazine   Total time spent 35 minutes. Over half that time spent discussing above.    Glori Bickers, MD 12:24 PM

## 2022-11-06 NOTE — Progress Notes (Signed)
Patient presents for 10 month f/u in [facility] today alone. Reports no problems with VAD equipment or concerns with drive line.   Multiple admissions to [facility] b/w  Feb - August 2023 for SDH r/t falls. Pt was admitted again in August for VF and cardioverision.   Pt is now taking Amiodarone 200 daily for the VF episode. Pt is in NSR today in the 60s.  Pt tells me that he was removed from the heart transplant list a few months ago d/t vascular issues. Pt states that he was told if he doesn't have anymore problems he may be relisted in 6 months to 1 year.  Pt tells me that he went to ED at [facility] last night because "I didn't feel right." Pt was d/cd with no change in medications.   Vital Signs:  Doppler Pressure: 86 Automatc BP: 113/63 (95) HR: 66 SR  SPO2: UTO   Weight: 194.6 lb w/ eqt Last weight: 189.2 lb w/ eqt  Home weights: does not weigh daily   VAD Indication: Destination Therapy - HM III implanted at [facility] 06/04/20   VAD interrogation: Speed: 5600 Flow: 4.6 Power: 4.7w    PI: 3.7 Alarms: none Events:30-40 daily Hct 38  Fixed speed: 5600 Low speed limit: 5400 - set by [facility]  I reviewed the LVAD parameters from today and compared the results to the patient's prior recorded data. LVAD interrogation was NEGATIVE for significant power changes, NEGATIVE for clinical alarms and POSITIVE for PI events/speed drops. No programming changes were made and pump is functioning within specified parameters. Pt is performing daily controller and system monitor self tests along with completing weekly and monthly maintenance for LVAD equipment.   LVAD equipment check completed and is in good working order. Back-up equipment present.   Annual Equipment Maintenance on UBC/PM maintained by [facility].   Exit Site Care: VAD dressing maintained weekly by patient. Dressing clean, dry, intact. Anchor in place and correctly applied. Exit site well healed and incorporated.The velour is fully implanted at  exit site. No redness, tenderness, drainage, or foul odor noted. Pt denies fever or chills. Pt obtains dressing supplies from [place]. States he has adequate dressing supplies at home.    Device: [employer] single Therapies: VF 250 VT 210         VT 185 (monitor)  Last check:  05/09/21; Latitude performed today - see above note   BP & Labs:  MAP 86 - Doppler is reflecting MAP   Hgb 11.3 - No S/S of bleeding. Specifically denies melena/BRBPR or nosebleeds.   LDH stable at 135 with established baseline of 150-250. Denies tea-colored urine. No power elevations noted on interrogation.   Patient Instructions: No change in medications. Return to [facility] in 3 months.  Tanda Rockers RN [place] Coordinator  Office: [phone]  24/7 Pager: [phone]

## 2022-11-06 NOTE — Patient Instructions (Signed)
No change in medications. Will send INR results to Lennette Bihari at [facility] for warfarin management. Return to [facility] in 3 months.

## 2022-11-07 ENCOUNTER — Encounter (HOSPITAL_COMMUNITY): Payer: Medicaid Other

## 2022-11-13 ENCOUNTER — Ambulatory Visit: Payer: Medicaid Other

## 2022-11-14 ENCOUNTER — Other Ambulatory Visit (HOSPITAL_COMMUNITY): Payer: Self-pay | Admitting: Internal Medicine

## 2022-11-14 DIAGNOSIS — Z95811 Presence of heart assist device: Secondary | ICD-10-CM

## 2022-11-14 DIAGNOSIS — I5042 Chronic combined systolic (congestive) and diastolic (congestive) heart failure: Secondary | ICD-10-CM

## 2022-11-15 ENCOUNTER — Telehealth (HOSPITAL_COMMUNITY): Payer: Self-pay | Admitting: Unknown Physician Specialty

## 2022-11-15 NOTE — Telephone Encounter (Signed)
Received notification from pts dentist requesting cardiac clearance for tooth extraction. D/w DR Bensimhon, we will defer to [facility] for cardiac clearance as pt is primarily followed there for his VAD/cardiac care. Called pt and left a VM letting him know that all information and request from dental office was faxed to [facility] team today.  Tanda Rockers RN, BSN VAD Coordinator 24/7 Pager [phone]

## 2022-11-20 ENCOUNTER — Other Ambulatory Visit (HOSPITAL_COMMUNITY): Payer: Self-pay | Admitting: Internal Medicine

## 2022-11-20 DIAGNOSIS — M1000 Idiopathic gout, unspecified site: Secondary | ICD-10-CM

## 2022-11-22 ENCOUNTER — Encounter (HOSPITAL_COMMUNITY): Payer: Self-pay

## 2022-11-22 ENCOUNTER — Emergency Department (HOSPITAL_COMMUNITY): Payer: Medicare Other

## 2022-11-22 ENCOUNTER — Other Ambulatory Visit: Payer: Self-pay

## 2022-11-22 ENCOUNTER — Emergency Department (HOSPITAL_COMMUNITY)
Admission: EM | Admit: 2022-11-22 | Discharge: 2022-11-22 | Disposition: A | Discharge status: Home / Self Care | Payer: Medicare Other | Attending: Emergency Medicine | Admitting: Emergency Medicine

## 2022-11-22 ENCOUNTER — Telehealth (HOSPITAL_COMMUNITY): Payer: Self-pay | Admitting: *Deleted

## 2022-11-22 DIAGNOSIS — I5022 Chronic systolic (congestive) heart failure: Secondary | ICD-10-CM | POA: Insufficient documentation

## 2022-11-22 DIAGNOSIS — I5021 Acute systolic (congestive) heart failure: Secondary | ICD-10-CM | POA: Diagnosis not present

## 2022-11-22 DIAGNOSIS — Z85850 Personal history of malignant neoplasm of thyroid: Secondary | ICD-10-CM | POA: Insufficient documentation

## 2022-11-22 DIAGNOSIS — R0789 Other chest pain: Secondary | ICD-10-CM | POA: Diagnosis present

## 2022-11-22 DIAGNOSIS — I2510 Atherosclerotic heart disease of native coronary artery without angina pectoris: Secondary | ICD-10-CM | POA: Insufficient documentation

## 2022-11-22 DIAGNOSIS — Z95811 Presence of heart assist device: Secondary | ICD-10-CM | POA: Insufficient documentation

## 2022-11-22 DIAGNOSIS — N189 Chronic kidney disease, unspecified: Secondary | ICD-10-CM | POA: Diagnosis not present

## 2022-11-22 DIAGNOSIS — J45909 Unspecified asthma, uncomplicated: Secondary | ICD-10-CM | POA: Diagnosis not present

## 2022-11-22 DIAGNOSIS — Z79899 Other long term (current) drug therapy: Secondary | ICD-10-CM | POA: Diagnosis not present

## 2022-11-22 DIAGNOSIS — R072 Precordial pain: Secondary | ICD-10-CM | POA: Diagnosis not present

## 2022-11-22 DIAGNOSIS — I130 Hypertensive heart and chronic kidney disease with heart failure and stage 1 through stage 4 chronic kidney disease, or unspecified chronic kidney disease: Secondary | ICD-10-CM | POA: Insufficient documentation

## 2022-11-22 DIAGNOSIS — Z7901 Long term (current) use of anticoagulants: Secondary | ICD-10-CM | POA: Insufficient documentation

## 2022-11-22 DIAGNOSIS — E8770 Fluid overload, unspecified: Secondary | ICD-10-CM

## 2022-11-22 LAB — CBC WITH DIFFERENTIAL/PLATELET
Abs Immature Granulocytes: 0.01 10*3/uL (ref 0.00–0.07)
Basophils Absolute: 0 10*3/uL (ref 0.0–0.1)
Basophils Relative: 1 %
Eosinophils Absolute: 0.1 10*3/uL (ref 0.0–0.5)
Eosinophils Relative: 5 %
HCT: 35.9 % — ABNORMAL LOW (ref 39.0–52.0)
Hemoglobin: 11.3 g/dL — ABNORMAL LOW (ref 13.0–17.0)
Immature Granulocytes: 0 %
Lymphocytes Relative: 29 %
Lymphs Abs: 0.9 10*3/uL (ref 0.7–4.0)
MCH: 26.2 pg (ref 26.0–34.0)
MCHC: 31.5 g/dL (ref 30.0–36.0)
MCV: 83.3 fL (ref 80.0–100.0)
Monocytes Absolute: 0.3 10*3/uL (ref 0.1–1.0)
Monocytes Relative: 9 %
Neutro Abs: 1.8 10*3/uL (ref 1.7–7.7)
Neutrophils Relative %: 56 %
Platelets: 124 10*3/uL — ABNORMAL LOW (ref 150–400)
RBC: 4.31 MIL/uL (ref 4.22–5.81)
RDW: 16.5 % — ABNORMAL HIGH (ref 11.5–15.5)
WBC: 3.1 10*3/uL — ABNORMAL LOW (ref 4.0–10.5)
nRBC: 0 % (ref 0.0–0.2)

## 2022-11-22 LAB — BRAIN NATRIURETIC PEPTIDE: B Natriuretic Peptide: 670.1 pg/mL — ABNORMAL HIGH (ref 0.0–100.0)

## 2022-11-22 LAB — PROTIME-INR
INR: 2.1 — ABNORMAL HIGH (ref 0.8–1.2)
Prothrombin Time: 23.8 seconds — ABNORMAL HIGH (ref 11.4–15.2)

## 2022-11-22 LAB — BASIC METABOLIC PANEL
Anion gap: 9 (ref 5–15)
BUN: 23 mg/dL — ABNORMAL HIGH (ref 6–20)
CO2: 25 mmol/L (ref 22–32)
Calcium: 8 mg/dL — ABNORMAL LOW (ref 8.9–10.3)
Chloride: 100 mmol/L (ref 98–111)
Creatinine, Ser: 1.75 mg/dL — ABNORMAL HIGH (ref 0.61–1.24)
GFR, Estimated: 44 mL/min — ABNORMAL LOW (ref >60)
Glucose, Bld: 96 mg/dL (ref 70–99)
Potassium: 3.7 mmol/L (ref 3.5–5.1)
Sodium: 134 mmol/L — ABNORMAL LOW (ref 135–145)

## 2022-11-22 LAB — TROPONIN I (HIGH SENSITIVITY)
Troponin I (High Sensitivity): 15 ng/L (ref <18)
Troponin I (High Sensitivity): 17 ng/L (ref <18)

## 2022-11-22 LAB — LACTATE DEHYDROGENASE: LDH: 131 U/L (ref 98–192)

## 2022-11-22 MED ORDER — AMLODIPINE BESYLATE 5 MG PO TABS
5.0000 mg | ORAL_TABLET | Freq: Every day | ORAL | Status: DC
  Administered 2022-11-22: 5 mg via ORAL
  Filled 2022-11-22: qty 1

## 2022-11-22 MED ORDER — SACUBITRIL-VALSARTAN 49-51 MG PO TABS
1.0000 | ORAL_TABLET | Freq: Two times a day (BID) | ORAL | Status: DC
  Administered 2022-11-22: 1 via ORAL
  Filled 2022-11-22: qty 1

## 2022-11-22 MED ORDER — AMIODARONE HCL 200 MG PO TABS
200.0000 mg | ORAL_TABLET | Freq: Every day | ORAL | Status: DC
  Administered 2022-11-22: 200 mg via ORAL
  Filled 2022-11-22: qty 1

## 2022-11-22 MED ORDER — HYDRALAZINE HCL 50 MG PO TABS: 50.0000 mg | ORAL_TABLET | Freq: Three times a day (TID) | ORAL | Status: DC

## 2022-11-22 MED ORDER — WARFARIN SODIUM 5 MG PO TABS
5.0000 mg | ORAL_TABLET | Freq: Once | ORAL | Status: DC
  Filled 2022-11-22: qty 1

## 2022-11-22 MED ORDER — FUROSEMIDE 10 MG/ML IJ SOLN
40.0000 mg | Freq: Once | INTRAMUSCULAR | Status: AC
  Administered 2022-11-22: 40 mg via INTRAVENOUS
  Filled 2022-11-22: qty 4

## 2022-11-22 MED ORDER — ISOSORBIDE MONONITRATE ER 30 MG PO TB24
30.0000 mg | ORAL_TABLET | Freq: Every day | ORAL | Status: DC
  Administered 2022-11-22: 30 mg via ORAL
  Filled 2022-11-22: qty 1

## 2022-11-22 MED ORDER — WARFARIN - PHARMACIST DOSING INPATIENT: Freq: Every day | Status: DC

## 2022-11-22 MED ORDER — POTASSIUM CHLORIDE CRYS ER 20 MEQ PO TBCR
20.0000 meq | EXTENDED_RELEASE_TABLET | Freq: Once | ORAL | Status: AC
  Administered 2022-11-22: 20 meq via ORAL
  Filled 2022-11-22: qty 1

## 2022-11-22 MED ORDER — DAPAGLIFLOZIN PROPANEDIOL 5 MG PO TABS
5.0000 mg | ORAL_TABLET | Freq: Every morning | ORAL | Status: DC
  Administered 2022-11-22: 5 mg via ORAL
  Filled 2022-11-22: qty 1

## 2022-11-22 MED ORDER — WARFARIN SODIUM 4 MG PO TABS: 4.0000 mg | ORAL_TABLET | Freq: Every day | ORAL | Status: DC

## 2022-11-22 NOTE — Consult Note (Addendum)
Advanced Heart Failure Team Consult Note   Primary Physician: Leslie Dales, DO PCP-Cardiologist:  Glori Bickers, MD  Reason for Consultation: Heart Failure  HPI:    Monroe Laurice Record. is seen today for evaluation of heart failure at the request of Dr Kathrynn Humble.   Abdulai is a 61 y.o. male with aevere HTN, CAD s/p MI with CABG 2006 and DES to native PDA in 3382, systolic HF EF 50-53% , and [employer] ICD. 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    Underwent high risk HM-III VAD placement at [facility] 6/21.    Echo 3/22 EF 20% RV moderate HK.  RHC with exercise 3/22 on 5600  Rest RA 9 PCWP 12 CI 2.4 MAP 100 Exercise PCWP 27 CI 2.5 pVO2 7.4 slope 42   Had multiple admissions to [facility] b/w  Feb - August 2023. Initially seen for SDH with midline shift due to falls  Pt was admitted again in August for VF and cardioverision.    Now taking Amiodarone 200 daily for the VF episode. Pt is in NSR today in the 60s.Was removed from the heart transplant list a few months ago d/t vascular issues.  Started feeling bad about a week ago.  He takes 20 mg of torsemide 1-2 times per week. He took torsemide 3 days ago.  + Orthopnea. Denies PND. No recent change in his diet. Taking all medications. No bleeding issues.   Presented to the ED with chest pain. CXR with pulmonary congestion. HS Trop 15>17. BNP 670, INR 2.1,  and LDH 131. Chest pain and arm pain resolved.   LVAD Interrogation HM III: Speed: 5600 Flow: 4.3 PI: 3.5 Power: 4.2   20-30 PI events    Review of Systems: [y] = yes, '[ ]'$  = no   General: Weight gain '[ ]'$ ; Weight loss '[ ]'$ ; Anorexia '[ ]'$ ; Fatigue [Y ]; Fever '[ ]'$ ; Chills '[ ]'$ ; Weakness [Y ]  Cardiac: Chest pain/pressure [ Y]; Resting SOB '[ ]'$ ; Exertional SOB [ Y];  Orthopnea '[ ]'$ ; Pedal Edema '[ ]'$ ; Palpitations '[ ]'$ ; Syncope '[ ]'$ ; Presyncope '[ ]'$ ; Paroxysmal nocturnal dyspnea'[ ]'$   Pulmonary: Cough '[ ]'$ ; Wheezing'[ ]'$ ; Hemoptysis'[ ]'$ ; Sputum '[ ]'$ ; Snoring '[ ]'$   GI: Vomiting'[ ]'$ ; Dysphagia'[ ]'$ ; Melena'[ ]'$ ; Hematochezia '[ ]'$ ; Heartburn'[ ]'$ ; Abdominal pain '[ ]'$ ; Constipation '[ ]'$ ; Diarrhea '[ ]'$ ; BRBPR '[ ]'$   GU: Hematuria'[ ]'$ ; Dysuria '[ ]'$ ; Nocturia'[ ]'$   Vascular: Pain in legs with walking '[ ]'$ ; Pain in feet with lying flat '[ ]'$ ; Non-healing sores '[ ]'$ ; Stroke '[ ]'$ ; TIA '[ ]'$ ; Slurred speech '[ ]'$ ;  Neuro: Headaches'[ ]'$ ; Vertigo'[ ]'$ ; Seizures'[ ]'$ ; Paresthesias'[ ]'$ ;Blurred vision '[ ]'$ ; Diplopia '[ ]'$ ; Vision changes '[ ]'$   Ortho/Skin: Arthritis '[ ]'$ ; Joint pain '[ ]'$ ; Muscle pain '[ ]'$ ; Joint swelling '[ ]'$ ; Back Pain '[ ]'$ ; Rash '[ ]'$   Psych: Depression'[ ]'$ ; Anxiety'[ ]'$   Heme: Bleeding problems '[ ]'$ ; Clotting disorders '[ ]'$ ; Anemia '[ ]'$   Endocrine: Diabetes '[ ]'$ ; Thyroid dysfunction'[ ]'$   Home Medications Prior to Admission medications   Medication Sig Start Date End Date Taking? Authorizing Provider  acetaminophen (TYLENOL) 325 MG tablet Take 2 tablets (650 mg total) by mouth every 4 (four) hours as needed for headache or mild pain.  05/03/20   Darrick Grinder D, NP  allopurinol (ZYLOPRIM) 100 MG tablet Take 1 tablet by mouth once daily 06/27/22   Edwena Mayorga, Shaune Pascal, MD  amiodarone (PACERONE) 200 MG tablet Take 200 mg by mouth daily.    [provider]  amLODipine (NORVASC) 5 MG tablet Take 5 mg by mouth daily. 01/15/22   [provider]  carvedilol (COREG) 6.25 MG tablet Take 1 tablet (6.25 mg total) by mouth 2 (two) times daily with a meal. If systolic is less than 90 you may hold Coreg until systolic is greater than 90, then resume taking **Needs follow up appointment** 10/05/22   Richmond Coldren, Shaune Pascal, MD  colchicine 0.6 MG tablet Take 1 tablet by mouth once daily 11/21/22   Marquail Bradwell, Shaune Pascal, MD  FARXIGA 5 MG TABS tablet Take 5 mg by mouth every morning. 02/15/21   [provider]  ferrous sulfate  325 (65 FE) MG tablet Take 325 mg by mouth daily with breakfast.    [provider]  fluticasone (FLONASE) 50 MCG/ACT nasal spray Place 2 sprays into both nostrils daily as needed for allergies or rhinitis.     [provider]  gabapentin (NEURONTIN) 300 MG capsule Take 1 capsule (300 mg total) by mouth at bedtime. Patient taking differently: Take 300 mg by mouth 3 (three) times daily. 08/25/20   Raeqwon Lux, Shaune Pascal, MD  hydrALAZINE (APRESOLINE) 100 MG tablet Take 50 mg by mouth 3 (three) times daily. 01/19/21   [provider]  isosorbide mononitrate (IMDUR) 30 MG 24 hr tablet Take 30 mg by mouth daily. 03/06/21   [provider]  naloxone [facility]) nasal spray 4 mg/0.1 mL Place 1 spray into the nose as needed (accidental overdose). Patient not taking: Reported on 11/06/2022    [provider]  nitroGLYCERIN (NITROSTAT) 0.4 MG SL tablet DISSOLVE ONE TABLET UNDER THE TONGUE EVERY 5 MINUTES AS NEEDED FOR CHEST PAIN.  MAY TAKE IF DIASTOLIC BP IS GREATER THAN 90 11/15/22   Casondra Gasca, Shaune Pascal, MD  Oxycodone HCl 20 MG TABS Take 20 mg by mouth every 6 (six) hours. 12/22/19   [provider]  potassium chloride (KLOR-CON) 10 MEQ tablet Take 10 mEq by mouth daily as needed. Only take 1 if you take Torsemide    [provider]  sacubitril-valsartan (ENTRESTO) 49-51 MG Take 1 tablet by mouth 2 (two) times daily.    [provider]  torsemide (DEMADEX) 20 MG tablet Take 20 mg by mouth daily as needed.    [provider]  warfarin (COUMADIN) 4 MG tablet Take 4 mg by mouth at bedtime.    [provider]    Past Medical History: Past Medical History:  Diagnosis Date   AICD (automatic cardioverter/defibrillator) present    Anemia    Anginal pain ([place])    Anxiety    Aortic dissection, thoracoabdominal ([place])    7/10: Type I s/p repair   Arthritis    Asthma    CAD (coronary artery disease)    a. s/p CABG 2006;  b. DES to  PDA 2011 (cath: Dx not seen, dRCA/PDA tx with DES; S-PDA occluded (culprit), S-Dx occluded, S-RI and OM ok, L-LAD ok   Carotid stenosis    dopplers 2011: 0-39% bilat.   Chest pain syndrome    CHF (congestive heart failure) ([place])    06/14/21 LVAD placemnt [facility]   Chronic bronchitis (HCC)    Chronic lower back pain    Chronic systolic heart failure ([place])  a. 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; the other is hanging on" (11/07/2016)   Dyspnea    patient denies at 12/06/2018 appt    Family history of adverse reaction to anesthesia    "sister hard to wake up"   GERD (gastroesophageal reflux disease)    Gout    Heart murmur    HLD (hyperlipidemia)    HTN (hypertension)    severe   Myocardial infarction ([place])    "many" (11/07/2016)   PONV (postoperative nausea and vomiting)    Sleep apnea    does not use mask    Thyroid cancer ([place])    Hertle Cell    Past Surgical History: Past Surgical History:  Procedure Laterality Date   BACK SURGERY     CARDIAC CATHETERIZATION     "several" (11/07/2016)   CARDIAC DEFIBRILLATOR PLACEMENT  11/2005   [employer]; Archie Endo 05/09/2011   CHOLECYSTECTOMY N/A 12/09/2018   Procedure: LAPAROSCOPIC CHOLECYSTECTOMY;  Surgeon: Ralene Ok, MD;  Location: [facility];  Service: General;  Laterality: N/A;   CORONARY ANGIOPLASTY WITH STENT PLACEMENT     "I've had 1-2 stents" (11/07/2016)   CORONARY ARTERY BYPASS GRAFT  06/21/2009   "CABG X2"   CORONARY ARTERY BYPASS GRAFT  06/29/2005   "CABG X7"   ICD LEAD REMOVAL  11/07/2016   ICD LEAD REMOVAL N/A 11/07/2016   Procedure: ICD LEAD REMOVAL, INSERTION OF NEW ICD LEAD;  Surgeon: Evans Lance, MD;  Location: [place];  Service:  Cardiovascular;  Laterality: N/A;  Dr. Prescott Gum to backup case   IMPLANTABLE CARDIOVERTER DEFIBRILLATOR (ICD) GENERATOR CHANGE N/A 12/13/2012   Procedure: ICD GENERATOR CHANGE;  Surgeon: Evans Lance, MD;  Location: [facility] CATH LAB;  Service: Cardiovascular;  Laterality: N/A;   INGUINAL HERNIA REPAIR Bilateral 06/25/2018   Procedure: LAPAROSCOPIC  BILATERAL INGUINAL HERNIA REPAIRS;  Surgeon: Ralene Ok, MD;  Location: [facility];  Service: General;  Laterality: Bilateral;   INSERTION OF MESH Bilateral 06/25/2018   Procedure: INSERTION OF MESH;  Surgeon: Ralene Ok, MD;  Location: [place];  Service: General;  Laterality: Bilateral;   [place] SURGERY  01/2001    most recent within 5-10 years   RIGHT HEART CATH N/A 04/05/2018   Procedure: RIGHT HEART CATH;  Surgeon: Jolaine Artist, MD;  Location: [place] CV LAB;  Service: Cardiovascular;  Laterality: N/A;   RIGHT HEART CATH N/A 01/19/2020   Procedure: RIGHT HEART CATH;  Surgeon: Jolaine Artist, MD;  Location: [place] CV LAB;  Service: Cardiovascular;  Laterality: N/A;   SHOULDER ARTHROSCOPY WITH ROTATOR CUFF REPAIR Bilateral    Status post emergency repair of a type A ascending aortic dissection with a hemiarch reconstruction of the ascending aorta  using a 28-mm Hemashield graft with redo sternotomy and revision of previous bypass grafts in June 2010.     TESTICLE SURGERY     THYROIDECTOMY, PARTIAL  06/20/2011   TONSILLECTOMY     UMBILICAL HERNIA REPAIR N/A 06/25/2018   Procedure: UMBILICAL HERNIA REPAIR;  Surgeon: Ralene Ok, MD;  Location: [place];  Service: General;  Laterality: N/A;   VASECTOMY      Family History: Family  History  Problem Relation Age of Onset   Hypertension Father    Heart disease Father    Early death Father    COPD Father    Hypertension Mother    Coronary artery disease Other     Social History: Social History   Socioeconomic History   Marital status: Divorced    Spouse name: Not on  file   Number of children: Not on file   Years of education: Not on file   Highest education level: Not on file  Occupational History   Occupation: disabled  Tobacco Use   Smoking status: Never   Smokeless tobacco: Never  Vaping Use   Vaping Use: Never used  Substance and Sexual Activity   Alcohol use: No   Drug use: No   Sexual activity: Yes  Other Topics Concern   Not on file  Social History Narrative   Engaged and lives with fiancee and 4 kids.    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   Lipitor [Atorvastatin Calcium] Anaphylaxis and Other (See Comments)    Large doses   Shellfish Allergy Anaphylaxis   Sulfa Antibiotics Shortness Of Breath and Swelling   Sulfonamide Derivatives Shortness Of Breath and Swelling   Almond Oil Itching    FACIAL/MOUTH ITCHING   Metrizamide Swelling    SWELLING REACTION UNSPECIFIED    Zocor [Simvastatin] Other (See Comments)    Muscle cramps   Food Itching    Shellfish, peaches, almonds, apples & kiwis   Latex Rash and Other (See Comments)    With long periods of exposure   Peach [Prunus Persica] Itching    Objective:    Vital Signs:   Temp:  [98.1 F (36.7 C)-98.6 F (37 C)] 98.6 F (37 C) (11/29 1030) Pulse Rate:  [62-69] 64 (11/29 1030) Resp:  [12-23] 15 (11/29 1030) BP: (89-108)/(0-76) 89/76 (11/29 0915) SpO2:  [95 %-100 %] 97 % (11/29 1030) Weight:  [86.2 kg] 86.2 kg (11/29 0614)    Weight change: Filed  Weights   11/22/22 0614  Weight: 86.2 kg    Intake/Output:   Intake/Output Summary (Last 24 hours) at 11/22/2022 1114 Last data filed at 11/22/2022 0745 Gross per 24 hour  Intake --  Output 500 ml  Net -500 ml      Physical Exam    Physical Exam: GENERAL: No acute distress. HEENT: normal  NECK: Supple, JVP  11-12.  2+ bilaterally, no bruits.  No lymphadenopathy or thyromegaly appreciated.   CARDIAC:  Mechanical heart sounds with LVAD hum present.  LUNGS:  Clear to auscultation bilaterally.  ABDOMEN:  Soft, round, nontender, positive bowel sounds x4.     LVAD exit site: well-healed and incorporated.  Dressing dry and intact.  No erythema or drainage.  Stabilization device present and accurately applied.  Driveline dressing is being changed daily per sterile technique. EXTREMITIES:  Warm and dry, no cyanosis, clubbing, rash or edema  NEUROLOGIC:  Alert and oriented x 3.  No aphasia.  No dysarthria.  Affect pleasant.      Telemetry  SR with PVCs   EKG    N/A   Labs   Basic Metabolic Panel: Recent Labs  Lab 11/22/22 0643  NA 134*  K 3.7  CL 100  CO2 25  GLUCOSE 96  BUN 23*  CREATININE 1.75*  CALCIUM 8.0*    Liver Function Tests: No results for input(s): "AST", "ALT", "ALKPHOS", "BILITOT", "PROT", "ALBUMIN" in the last 168 hours. No results for input(s): "LIPASE", "AMYLASE" in the last 168 hours. No results for input(s): "AMMONIA" in the last 168 hours.  CBC: Recent Labs  Lab 11/22/22 0643  WBC 3.1*  NEUTROABS 1.8  HGB 11.3*  HCT 35.9*  MCV 83.3  PLT 124*    Cardiac Enzymes: No results for input(s): "CKTOTAL", "CKMB", "CKMBINDEX", "TROPONINI" in the last 168 hours.  BNP: BNP (last 3 results) Recent Labs    08/11/22 2227 11/22/22 0643  BNP 738.1* 670.1*    ProBNP (last 3 results) No results for input(s): "PROBNP" in the last 8760 hours.   CBG: No results for input(s): "GLUCAP" in the last 168 hours.  Coagulation Studies: Recent  Labs    11/22/22 0643  LABPROT 23.8*  INR 2.1*     Imaging   DG Chest Portable 1 View  Result Date: 11/22/2022 CLINICAL DATA:  Chest pain EXAM: PORTABLE CHEST 1 VIEW COMPARISON:  08/11/2022 FINDINGS: Chronic cardiopericardial enlargement and stented thoracic aortic aneurysm. There is a pacer from the left and a left ventricular assist device. CABG and aortic valve replacement. Pulmonary vascular congestion. No visible effusion or pneumothorax. IMPRESSION: Pulmonary vascular congestion. Electronically Signed   By: Jorje Guild M.D.   On: 11/22/2022 06:25     Medications:     Current Medications:  amiodarone  200 mg Oral Daily   amLODipine  5 mg Oral Daily   dapagliflozin propanediol  5 mg Oral q morning   furosemide  40 mg Intravenous Once   hydrALAZINE  50 mg Oral TID   isosorbide mononitrate  30 mg Oral Daily   potassium chloride  20 mEq Oral Once   sacubitril-valsartan  1 tablet Oral BID   warfarin  4 mg Oral QHS    Infusions:    Assessment/Plan   1. Chest Pain  -HS Trop 15>17 - Resolved . Suspect demand ischemia   2. A/C HFrEF  Ischemic cardiomyopathy.  Echo (2/16) with EF 25%. S/p [employer] ICD. Echo 3/18. EF 20-25%. Echo 04/18/18: EF 20-25%, grade 2 DD. - s/p HM-III VAD at [facility] 6/21 - Echo and RHC at [facility] 3/22 (details above) - Mild volume overload. Took 20 mg torsemide 2 days ago. Appears warm and wet.  - Give 40 mg IV lasix x1+ 20 meq K.  He will need to start taking torsemide 20 mg every other day.  - Continue home HF meds.   3. HMIII VAD  LDH stable.  INR 2.1 - Goal 1.5-2.5  [facility] dosing coumadin  4. 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   5. VT/VF Has [employer] - Interrogation pending.  On amiodarone 200 mg daily  He does have volume overload. Home after dose of IV lasix.  Will need to take torsemide 20 mg every other day.  We will set up follow up in the VAD clinic.   Assessed by Dr  Haroldine Laws in the ED.    Length of Stay: 0  Darrick Grinder, NP  11/22/2022, 11:14 AM  Advanced Heart Failure Team Pager [phone] (M-F; 7a - 5p)  Please contact [facility] for night-coverage after hours (4p -7a ) and weekends on [email or web address]  Patient seen and examined with the above-signed Advanced Practice Provider and/or Housestaff. I personally reviewed laboratory data, imaging studies and relevant notes. I independently examined the patient and formulated the important aspects of the plan. I have edited the note to reflect any of my changes or salient points. I have personally discussed the plan with the patient and/or family.  61 y/o male with CAD/iCM, HTN, systolic HF s/p HM-III VAD, CKD 3b presents to ER with recurrent CP. CP last several hours. Now resolved.   In ER Hs trop normal. ECG non-acute. Evidence of volume overload  VAD parameters ok  General:  NAD.  HEENT: normal  Neck: supple. JVP to jaw with prominent v waves Carotids 2+ bilat; no bruits. No lymphadenopathy or thryomegaly appreciated. Cor: LVAD hum.  Lungs: Clear. Abdomen; soft, nontender, non-distended. No hepatosplenomegaly. No bruits or masses. Good bowel sounds. Driveline site clean. Anchor in place.  Extremities: no cyanosis, clubbing, rash. Warm tr edema  Neuro: alert & oriented x 3. No focal deficits. Moves all 4 without problem   Suspect symptoms mostly related to volume overload. No evidence of ACS. Agree with IV diuresis and d/c home. Can f/u in [facility] here or at [facility]. Recommended taking torsemide 20 qOD.   Glori Bickers, MD  4:27 PM

## 2022-11-22 NOTE — ED Triage Notes (Addendum)
Pt arrived by POV complaining of chest pain and tightness that started about 1hr PTA  Pt states he took 2 nitro tablets without relief.  Pt is an LVAD pt  Pt is short of breath and lightheaded.   Rapid Response RN notified of pt arrival

## 2022-11-22 NOTE — ED Notes (Signed)
[facility] VAD Coordinator aware and will let Dr. Sung Amabile know in am.  Pt is followed at [facility] and will give VAD Coordinator at [facility] number to ED Provider

## 2022-11-22 NOTE — ED Notes (Signed)
Pt provided with urinal

## 2022-11-22 NOTE — ED Notes (Signed)
All labs collected and sent to lab at this time

## 2022-11-22 NOTE — ED Provider Notes (Addendum)
  Physical Exam  BP 93/79   Pulse 72   Temp 98.6 F (37 C)   Resp 15   Ht '5\' 11"'$  (1.803 m)   Wt 86.2 kg   SpO2 99%   BMI 26.50 kg/m   Physical Exam  Procedures  .Critical Care  Performed by: Varney Biles, MD Authorized by: Varney Biles, MD   Critical care provider statement:    Critical care time (minutes):  36   Critical care was time spent personally by me on the following activities:  Development of treatment plan with patient or surrogate, discussions with consultants, evaluation of patient's response to treatment, examination of patient, ordering and review of laboratory studies, ordering and review of radiographic studies, ordering and performing treatments and interventions, pulse oximetry, re-evaluation of patient's condition and review of old charts   ED Course / MDM   Clinical Course as of 11/22/22 1352  Wed Nov 22, 2022  0703 Per charge nurse, [place] Cage, [place] team is aware.  When work-up is complete, contact [facility] VAT coordinator at [phone]. [CH]  0848 DG Chest Portable 1 View X-ray independently interpreted.  There is evidence of pulmonary congestion.  No hypoxia. [AN]  0849 Hemoglobin(!): 11.3 Patient's hemoglobin is at baseline. [AN]  0849 Creatinine(!): 1.75 Patient's creatinine is at baseline. [AN]  0849 Troponin I (High Sensitivity): 15 Initial high-sensitivity troponin is normal.  BNP is still pending. [AN]  4818 Patient reassessed.  States that he still has mild chest discomfort.  Cardiology service has not seen the patient.  I have consulted LVAD team at [facility] awaiting response from them. [AN]  1100 I spoke with the LVAD team at [facility] around 10 AM. They are comfortable with patient staying at [facility] if needed for diuresis.  I have consulted: Cardiology/heart failure team.  They will come and see the patient and decide on the plan.  X-ray shows pulmonary vascular congestion, likely patient is slightly volume overloaded.  He is denying any orthopnea,  PND.  X-ray does show some vascular congestion.  We will give him his oral home meds.  Holding carvedilol now. [AN]  1212 Cards team plans to diurese and possibly recommend dc.  [AN]  5631 Interpreted report from [employer].  Patient has not had any VT or V-fib events recently.  Last event was a nonsustained VT on August 19 and August 18. [AN]    Clinical Course User Index [AN] Varney Biles, MD [CH] Horton, Barbette Hair, MD   Medical Decision Making Amount and/or Complexity of Data Reviewed Labs: ordered. Decision-making details documented in ED Course. Radiology: ordered. Decision-making details documented in ED Course.  Risk Prescription drug management.   Patient with history of aortic dissection, coronary artery disease status post CABG and multiple PCI, VT history with [place] and LVAD because of advanced CHF with a EF of 20% comes into the emergency room with chief complaint of chest pain.  Patient indicates that he starting left-sided chest pain at 2 AM.  He arrived to the ER at 6 AM.  He gets his LVAD managed at [facility], but also has established relationship with heart failure team at [facility].  Patient is EKG has no acute changes.  Appropriate labs have been sent and all results are pending.           Varney Biles, MD 11/22/22 [place], Devarion Mcclanahan, MD 11/22/22 1352

## 2022-11-22 NOTE — ED Provider Notes (Signed)
[facility] EMERGENCY DEPARTMENT Provider Note   CSN: [id number] Arrival date & time: 11/22/22  0557     History  Chief Complaint  Patient presents with   Chest Pain    Chad [name]. is a 61 y.o. male.  HPI     This is a 61 year old male with extensive past medical history including hypertension, hyperlipidemia, chronic kidney disease, CHF with LVAD placement who presents with chest pain.  Patient reports over 1 hour history of left-sided chest discomfort that radiates down the left flank and up into the left neck.  He states that it is severe.  He has had pain like this in the past which she believes was related to heart attacks.  Recent change in medications includes changed to amiodarone.  He denies any significant shortness of breath.  No recent illnesses or fevers.  He is followed primarily at [facility].  Home Medications Prior to Admission medications   Medication Sig Start Date End Date Taking? Authorizing Provider  acetaminophen (TYLENOL) 325 MG tablet Take 2 tablets (650 mg total) by mouth every 4 (four) hours as needed for headache or mild pain. 05/03/20   Darrick Grinder D, NP  allopurinol (ZYLOPRIM) 100 MG tablet Take 1 tablet by mouth once daily 06/27/22   Bensimhon, Shaune Pascal, MD  amiodarone (PACERONE) 200 MG tablet Take 200 mg by mouth daily.    [provider]  amLODipine (NORVASC) 5 MG tablet Take 5 mg by mouth daily. 01/15/22   [provider]  carvedilol (COREG) 6.25 MG tablet Take 1 tablet (6.25 mg total) by mouth 2 (two) times daily with a meal. If systolic is less than 90 you may hold Coreg until systolic is greater than 90, then resume taking **Needs follow up appointment** 10/05/22   Bensimhon, Shaune Pascal, MD  colchicine 0.6 MG tablet Take 1 tablet by mouth once daily 11/21/22   Bensimhon, Shaune Pascal, MD  FARXIGA 5 MG TABS tablet Take 5 mg by mouth every morning. 02/15/21   [provider]  ferrous sulfate 325 (65  FE) MG tablet Take 325 mg by mouth daily with breakfast.    [provider]  fluticasone (FLONASE) 50 MCG/ACT nasal spray Place 2 sprays into both nostrils daily as needed for allergies or rhinitis.     [provider]  gabapentin (NEURONTIN) 300 MG capsule Take 1 capsule (300 mg total) by mouth at bedtime. Patient taking differently: Take 300 mg by mouth 3 (three) times daily. 08/25/20   Bensimhon, Shaune Pascal, MD  hydrALAZINE (APRESOLINE) 100 MG tablet Take 50 mg by mouth 3 (three) times daily. 01/19/21   [provider]  isosorbide mononitrate (IMDUR) 30 MG 24 hr tablet Take 30 mg by mouth daily. 03/06/21   [provider]  naloxone [facility]) nasal spray 4 mg/0.1 mL Place 1 spray into the nose as needed (accidental overdose). Patient not taking: Reported on 11/06/2022    [provider]  nitroGLYCERIN (NITROSTAT) 0.4 MG SL tablet DISSOLVE ONE TABLET UNDER THE TONGUE EVERY 5 MINUTES AS NEEDED FOR CHEST PAIN.  MAY TAKE IF DIASTOLIC BP IS GREATER THAN 90 11/15/22   Bensimhon, Shaune Pascal, MD  Oxycodone HCl 20 MG TABS Take 20 mg by mouth every 6 (six) hours. 12/22/19   [provider]  potassium chloride (KLOR-CON) 10 MEQ tablet Take 10 mEq by mouth daily as needed. Only take 1 if you take Torsemide    [provider]  sacubitril-valsartan (ENTRESTO) 49-51 MG  Take 1 tablet by mouth 2 (two) times daily.    [provider]  torsemide (DEMADEX) 20 MG tablet Take 20 mg by mouth daily as needed.    [provider]  warfarin (COUMADIN) 4 MG tablet Take 4 mg by mouth at bedtime.    [provider]      Allergies    Contrast media [iodinated contrast media], Iohexol, Lipitor [atorvastatin calcium], Shellfish allergy, Sulfa antibiotics, Sulfonamide derivatives, Almond oil, Metrizamide, Zocor [simvastatin], Food, Latex, and Peach [prunus persica]    Review of Systems   Review of Systems  Constitutional:  Negative for fever.   Respiratory:  Negative for shortness of breath.   Cardiovascular:  Positive for chest pain.  Gastrointestinal:  Negative for abdominal pain, nausea and vomiting.  All other systems reviewed and are negative.   Physical Exam Updated Vital Signs BP (!) 108/0 (BP Location: Right Arm)   Pulse 69   Temp 98.1 F (36.7 C) (Oral)   Resp 12   Ht 1.803 m ('5\' 11"'$ )   Wt 86.2 kg   SpO2 99%   BMI 26.50 kg/m  Physical Exam Vitals and nursing note reviewed.  Constitutional:      Comments: Chronically ill-appearing but nontoxic  HENT:     Head: Normocephalic and atraumatic.  Eyes:     Pupils: Pupils are equal, round, and reactive to light.  Cardiovascular:     Rate and Rhythm: Normal rate and regular rhythm.     Heart sounds: Normal heart sounds. No murmur heard.    Comments: LVAD hum noted Pulmonary:     Effort: Pulmonary effort is normal. No respiratory distress.     Breath sounds: Normal breath sounds. No wheezing.  Abdominal:     General: Bowel sounds are normal.     Palpations: Abdomen is soft.     Tenderness: There is no abdominal tenderness. There is no rebound.  Musculoskeletal:     Cervical back: Neck supple.     Right lower leg: No edema.     Left lower leg: No edema.  Skin:    General: Skin is warm and dry.  Neurological:     Mental Status: He is alert and oriented to person, place, and time.  Psychiatric:        Mood and Affect: Mood normal.     ED Results / Procedures / Treatments   Labs (all labs ordered are listed, but only abnormal results are displayed) Labs Reviewed  CBC WITH DIFFERENTIAL/PLATELET  BASIC METABOLIC PANEL  LACTATE DEHYDROGENASE  BRAIN NATRIURETIC PEPTIDE  PROTIME-INR  TROPONIN I (HIGH SENSITIVITY)    EKG EKG Interpretation  Date/Time:  Wednesday November 22 2022 06:11:25 EST Ventricular Rate:  76 PR Interval:  193 QRS Duration: 146 QT Interval:  440 QTC Calculation: 436 R Axis:   258 Text Interpretation: Sinus rhythm  Ventricular bigeminy Left atrial enlargement Nonspecific IVCD with LAD Borderline T abnormalities, lateral leads Artifact in lead(s) II III aVR aVL aVF V1 V2 V3 V4 V5 V6 Confirmed by Thayer Jew [phone]) on 11/22/2022 6:43:10 AM  Radiology DG Chest Portable 1 View  Result Date: 11/22/2022 CLINICAL DATA:  Chest pain EXAM: PORTABLE CHEST 1 VIEW COMPARISON:  08/11/2022 FINDINGS: Chronic cardiopericardial enlargement and stented thoracic aortic aneurysm. There is a pacer from the left and a left ventricular assist device. CABG and aortic valve replacement. Pulmonary vascular congestion. No visible effusion or pneumothorax. IMPRESSION: Pulmonary vascular congestion. Electronically Signed   By: Gilford Silvius.D.  On: 11/22/2022 06:25    Procedures Procedures    Medications Ordered in ED Medications - No data to display  ED Course/ Medical Decision Making/ A&P Clinical Course as of 11/22/22 0704  Wed Nov 22, 2022  0703 Per charge nurse, Chad [name], [place] team is aware.  When work-up is complete, contact [facility] coordinator at [phone]. [CH]    Clinical Course User Index [CH] Jovanka Westgate, Barbette Hair, MD                           Medical Decision Making Amount and/or Complexity of Data Reviewed Labs: ordered. Radiology: ordered.   This patient presents to the ED for concern of chest pain, this involves an extensive number of treatment options, and is a complaint that carries with it a high risk of complications and morbidity.  I considered the following differential and admission for this acute, potentially life threatening condition.  The differential diagnosis includes ischemia, PE, dissection, pneumonia, pneumothorax  MDM:    This is a 61 year old male who presents with chest pain.  Significant cardiac history including coronary artery disease and dissection.  He is nontoxic.  He is overall well perfused and vital signs are largely reassuring.  EKG is without obvious ischemic changes  or arrhythmia.  Labs are pending including LDH.  Chest x-ray shows some pulmonary vascular congestion.  He is in no respiratory distress.  Patient signed out pending work-up.  Will need to touch base with VAD coordinator.  (Labs, imaging, consults)  Labs: I Ordered, and personally interpreted labs.  The pertinent results include: CBC, BMP, LDH, BNP, INR, troponin  Imaging Studies ordered: I ordered imaging studies including chest x-ray I independently visualized and interpreted imaging. I agree with the radiologist interpretation  Additional history obtained from family at bedside.  External records from outside source obtained and reviewed including recent LVAD clinic notes  Cardiac Monitoring: The patient was maintained on a cardiac monitor.  I personally viewed and interpreted the cardiac monitored which showed an underlying rhythm of: Sinus rhythm  Reevaluation: After the interventions noted above, I reevaluated the patient and found that they have :stayed the same  Social Determinants of Health:  lives independently  Disposition: Pending work-up  Co morbidities that complicate the patient evaluation  Past Medical History:  Diagnosis Date   AICD (automatic cardioverter/defibrillator) present    Anemia    Anginal pain ([place])    Anxiety    Aortic dissection, thoracoabdominal ([place])    7/10: Type I s/p repair   Arthritis    Asthma    CAD (coronary artery disease)    a. s/p CABG 2006;  b. DES to PDA 2011 (cath: Dx not seen, dRCA/PDA tx with DES; S-PDA occluded (culprit), S-Dx occluded, S-RI and OM ok, L-LAD ok   Carotid stenosis    dopplers 2011: 0-39% bilat.   Chest pain syndrome    CHF (congestive heart failure) ([place])    06/14/21 LVAD placemnt [facility]   Chronic bronchitis (HCC)    Chronic lower back pain    Chronic systolic heart failure ([place])    a. 12/13 ECHO: EF 35-40%, sept, apical & posterobasal HK, LV mod dil & sys fx mod reduced, mild AI, MV mild reg, TV mild reg    Complication of anesthesia    "difficult to wake afterwards a couple of times" and patient states he has woken during surgery    CRI (chronic renal insufficiency)    "one kidney  is gone; the other is hanging on" (11/07/2016)   Dyspnea    patient denies at 12/06/2018 appt    Family history of adverse reaction to anesthesia    "sister hard to wake up"   GERD (gastroesophageal reflux disease)    Gout    Heart murmur    HLD (hyperlipidemia)    HTN (hypertension)    severe   Myocardial infarction [facility])    "many" (11/07/2016)   PONV (postoperative nausea and vomiting)    Sleep apnea    does not use mask    Thyroid cancer ([place])    Hertle Cell     Medicines No orders of the defined types were placed in this encounter.   I have reviewed the patients home medicines and have made adjustments as needed  Problem List / ED Course: Problem List Items Addressed This Visit   None Visit Diagnoses     Chest pain, precordial    -  Primary                   Final Clinical Impression(s) / ED Diagnoses Final diagnoses:  Chest pain, precordial    Rx / DC Orders ED Discharge Orders     None         Merryl Hacker, MD 11/22/22 [phone]

## 2022-11-22 NOTE — Progress Notes (Signed)
LVAD Coordinator ED Encounter  Chad [name]. a 61 y.o. male that presented to [facility] ER today due to chest pain. He has a past medical history  has a past medical history of AICD (automatic cardioverter/defibrillator) present, Anemia, Anginal pain ([place]), Anxiety, Aortic dissection, thoracoabdominal (HCC), Arthritis, Asthma, CAD (coronary artery disease), Carotid stenosis, Chest pain syndrome, CHF (congestive heart failure) ([place]), Chronic bronchitis ([place]), Chronic lower back pain, Chronic systolic heart failure ([place]), Complication of anesthesia, CRI (chronic renal insufficiency), Dyspnea, Family history of adverse reaction to anesthesia, GERD (gastroesophageal reflux disease), Gout, Heart murmur, HLD (hyperlipidemia), HTN (hypertension), Myocardial infarction ([place]), PONV (postoperative nausea and vomiting), Sleep apnea, and Thyroid cancer ([place]).[name]   LVAD is a HM 3 and was implanted at [facility]. INR managed by [facility].   Pt presented to the ER at 0600 this morning complaining of severe chest pain. Reports pain started at 0200, and was unresolved after taking 2 Nitro tabs. Describes pain as "tight, painful, and traveled down my left side." States the pain is a "new" pain compared to previous chest pain episodes. Reports some shortness of breath when pain started, but this has since resolved. States pain "gradually got better" and resolved "about an hour ago".  ER team attempting to interrogate [facility] device- unable to transmit. They plan to contact rep. Pt denies ICD shock.   Chest xray completed- pulmonary vascular congestion Labs appear stable at baseline. INR 2.1 ([facility] manages- results routed to [facility] team)  [facility] VAD [phone]  Vital signs: Temp: 98.1 HR: 66 w/ frequent PVCs Doppler MAP: 108 Automated BP: 89/76 (82) (taken personally) O2 Sat: 99% on RA  LVAD interrogation reveals:  Speed: 5600 Flow: 4.3 Power: 4.2 w  PI: 3.5  Alarms: none Events: 20 - 30 PI events  daily  Drive Line: Maintained by his niece Chad [name].   Significant Events with LVAD:   Updated VAD Providers (Dr Haroldine Laws) about the above. No LVAD issues and pump is functioning as expected. Able to independently manage LVAD equipment. No LVAD needs at this time.    Chad Monte RN [place] Coordinator  Office: [phone]  24/7 Pager: [phone]

## 2022-11-22 NOTE — Telephone Encounter (Signed)
Left voicemail message for pt regarding follow up appt with Dr Haroldine Laws scheduled 12/01/22 at 0900. Requested call back to confirm he received message.   Called and spoke with pt's niece [name] with the above appt information. She verbalized understanding.   Emerson Monte RN [place] Coordinator  Office: [phone]  24/7 Pager: [phone]

## 2022-11-22 NOTE — Progress Notes (Signed)
ANTICOAGULATION CONSULT NOTE - Initial Consult  Pharmacy Consult for warfarin Indication: LVAD  Allergies  Allergen Reactions   Contrast Media [Iodinated Contrast Media] Anaphylaxis   Iohexol Anaphylaxis and Other (See Comments)    PT HAS ANAPHYLAXIS WITH CONTRAST MEDIA!   Lipitor [Atorvastatin Calcium] Anaphylaxis and Other (See Comments)    Large doses   Shellfish Allergy Anaphylaxis   Sulfa Antibiotics Shortness Of Breath and Swelling   Sulfonamide Derivatives Shortness Of Breath and Swelling   Almond Oil Itching    FACIAL/MOUTH ITCHING   Metrizamide Swelling    SWELLING REACTION UNSPECIFIED    Zocor [Simvastatin] Other (See Comments)    Muscle cramps   Food Itching    Shellfish, peaches, almonds, apples & kiwis   Latex Rash and Other (See Comments)    With long periods of exposure   Peach [Prunus Persica] Itching    Patient Measurements: Height: '5\' 11"'$  (180.3 cm) Weight: 86.2 kg (190 lb) IBW/kg (Calculated) : 75.3  Vital Signs: Temp: 98.6 F (37 C) (11/29 1030) Temp Source: Oral (11/29 0615) BP: 89/76 (11/29 0915) Pulse Rate: 64 (11/29 1030)  Labs: Recent Labs    11/22/22 0643 11/22/22 0852  HGB 11.3*  --   HCT 35.9*  --   PLT 124*  --   LABPROT 23.8*  --   INR 2.1*  --   CREATININE 1.75*  --   TROPONINIHS 15 17    Estimated Creatinine Clearance: 48.4 mL/min (A) (by C-G formula based on SCr of 1.75 mg/dL (H)).   Medical History: Past Medical History:  Diagnosis Date   AICD (automatic cardioverter/defibrillator) present    Anemia    Anginal pain ([place])    Anxiety    Aortic dissection, thoracoabdominal ([place])    7/10: Type I s/p repair   Arthritis    Asthma    CAD (coronary artery disease)    a. s/p CABG 2006;  b. DES to PDA 2011 (cath: Dx not seen, dRCA/PDA tx with DES; S-PDA occluded (culprit), S-Dx occluded, S-RI and OM ok, L-LAD ok   Carotid stenosis    dopplers 2011: 0-39% bilat.   Chest pain syndrome    CHF (congestive heart failure)  ([place])    06/14/21 LVAD placemnt [facility]   Chronic bronchitis (HCC)    Chronic lower back pain    Chronic systolic heart failure ([place])    a. 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; the other is hanging on" (11/07/2016)   Dyspnea    patient denies at 12/06/2018 appt    Family history of adverse reaction to anesthesia    "sister hard to wake up"   GERD (gastroesophageal reflux disease)    Gout    Heart murmur    HLD (hyperlipidemia)    HTN (hypertension)    severe   Myocardial infarction ([place])    "many" (11/07/2016)   PONV (postoperative nausea and vomiting)    Sleep apnea    does not use mask    Thyroid cancer ([place])    Hertle Cell    Assessment: 8 YOM presenting with CP, hx LVAD on warfarin PTA (lower end INR goals d/t SDH/falls per clinic).  INR  on admission therapeutic at 2.1  PTA dosing: Alternates '4mg'$  every other day with '5mg'$   Goal of Therapy:  INR 1.5-2.5 Monitor platelets by anticoagulation protocol: Yes   Plan:  Warfarin '5mg'$  PO x 1 today Daily INR, s/s bleeding  Bertis Ruddy, PharmD, [place] Pharmacist ED Pharmacist Phone # [phone] 11/22/2022 12:11 PM

## 2022-11-22 NOTE — ED Notes (Signed)
Rapid response RN at bedside to hook patient up to LVAD cart

## 2022-11-22 NOTE — Discharge Instructions (Signed)
Please start taking torsemide every other day starting tomorrow. Follow-up with the cardiology team within a week or 2 for routine follow-up after this visit.  Return to the ER if you start having worsening symptoms.

## 2022-11-28 ENCOUNTER — Emergency Department (HOSPITAL_COMMUNITY)
Admission: EM | Admit: 2022-11-28 | Discharge: 2022-11-28 | Disposition: A | Discharge status: Other Acute Inpatient Hospital | Payer: Medicare Other | Attending: Emergency Medicine | Admitting: Emergency Medicine

## 2022-11-28 ENCOUNTER — Emergency Department (HOSPITAL_COMMUNITY): Payer: Medicare Other

## 2022-11-28 DIAGNOSIS — R109 Unspecified abdominal pain: Secondary | ICD-10-CM | POA: Diagnosis not present

## 2022-11-28 DIAGNOSIS — Z91040 Latex allergy status: Secondary | ICD-10-CM | POA: Insufficient documentation

## 2022-11-28 DIAGNOSIS — D72819 Decreased white blood cell count, unspecified: Secondary | ICD-10-CM | POA: Insufficient documentation

## 2022-11-28 DIAGNOSIS — Z95811 Presence of heart assist device: Secondary | ICD-10-CM | POA: Diagnosis not present

## 2022-11-28 DIAGNOSIS — R778 Other specified abnormalities of plasma proteins: Secondary | ICD-10-CM | POA: Diagnosis not present

## 2022-11-28 DIAGNOSIS — R0789 Other chest pain: Secondary | ICD-10-CM | POA: Diagnosis not present

## 2022-11-28 DIAGNOSIS — R079 Chest pain, unspecified: Secondary | ICD-10-CM

## 2022-11-28 DIAGNOSIS — Z951 Presence of aortocoronary bypass graft: Secondary | ICD-10-CM | POA: Diagnosis not present

## 2022-11-28 DIAGNOSIS — I2510 Atherosclerotic heart disease of native coronary artery without angina pectoris: Secondary | ICD-10-CM | POA: Insufficient documentation

## 2022-11-28 DIAGNOSIS — Z7901 Long term (current) use of anticoagulants: Secondary | ICD-10-CM | POA: Insufficient documentation

## 2022-11-28 DIAGNOSIS — Z955 Presence of coronary angioplasty implant and graft: Secondary | ICD-10-CM | POA: Insufficient documentation

## 2022-11-28 DIAGNOSIS — R0602 Shortness of breath: Secondary | ICD-10-CM | POA: Diagnosis not present

## 2022-11-28 DIAGNOSIS — I10 Essential (primary) hypertension: Secondary | ICD-10-CM | POA: Diagnosis not present

## 2022-11-28 LAB — COMPREHENSIVE METABOLIC PANEL
ALT: 9 U/L (ref 0–44)
AST: 21 U/L (ref 15–41)
Albumin: 3.9 g/dL (ref 3.5–5.0)
Alkaline Phosphatase: 70 U/L (ref 38–126)
Anion gap: 9 (ref 5–15)
BUN: 21 mg/dL — ABNORMAL HIGH (ref 6–20)
CO2: 26 mmol/L (ref 22–32)
Calcium: 8.8 mg/dL — ABNORMAL LOW (ref 8.9–10.3)
Chloride: 101 mmol/L (ref 98–111)
Creatinine, Ser: 1.59 mg/dL — ABNORMAL HIGH (ref 0.61–1.24)
GFR, Estimated: 50 mL/min — ABNORMAL LOW (ref >60)
Glucose, Bld: 108 mg/dL — ABNORMAL HIGH (ref 70–99)
Potassium: 4.1 mmol/L (ref 3.5–5.1)
Sodium: 136 mmol/L (ref 135–145)
Total Bilirubin: 0.3 mg/dL (ref 0.3–1.2)
Total Protein: 7.2 g/dL (ref 6.5–8.1)

## 2022-11-28 LAB — CBC WITH DIFFERENTIAL/PLATELET
Abs Immature Granulocytes: 0 10*3/uL (ref 0.00–0.07)
Basophils Absolute: 0 10*3/uL (ref 0.0–0.1)
Basophils Relative: 1 %
Eosinophils Absolute: 0.1 10*3/uL (ref 0.0–0.5)
Eosinophils Relative: 3 %
HCT: 38.7 % — ABNORMAL LOW (ref 39.0–52.0)
Hemoglobin: 12 g/dL — ABNORMAL LOW (ref 13.0–17.0)
Immature Granulocytes: 0 %
Lymphocytes Relative: 28 %
Lymphs Abs: 1 10*3/uL (ref 0.7–4.0)
MCH: 25.8 pg — ABNORMAL LOW (ref 26.0–34.0)
MCHC: 31 g/dL (ref 30.0–36.0)
MCV: 83 fL (ref 80.0–100.0)
Monocytes Absolute: 0.4 10*3/uL (ref 0.1–1.0)
Monocytes Relative: 10 %
Neutro Abs: 2.1 10*3/uL (ref 1.7–7.7)
Neutrophils Relative %: 58 %
Platelets: 154 10*3/uL (ref 150–400)
RBC: 4.66 MIL/uL (ref 4.22–5.81)
RDW: 16.5 % — ABNORMAL HIGH (ref 11.5–15.5)
WBC: 3.6 10*3/uL — ABNORMAL LOW (ref 4.0–10.5)
nRBC: 0 % (ref 0.0–0.2)

## 2022-11-28 LAB — PROTIME-INR
INR: 2.1 — ABNORMAL HIGH (ref 0.8–1.2)
Prothrombin Time: 23.1 seconds — ABNORMAL HIGH (ref 11.4–15.2)

## 2022-11-28 LAB — LIPASE, BLOOD: Lipase: 33 U/L (ref 11–51)

## 2022-11-28 LAB — TROPONIN I (HIGH SENSITIVITY)
Troponin I (High Sensitivity): 25 ng/L — ABNORMAL HIGH (ref <18)
Troponin I (High Sensitivity): 23 ng/L — ABNORMAL HIGH (ref <18)

## 2022-11-28 LAB — BRAIN NATRIURETIC PEPTIDE: B Natriuretic Peptide: 621.6 pg/mL — ABNORMAL HIGH (ref 0.0–100.0)

## 2022-11-28 LAB — LACTATE DEHYDROGENASE: LDH: 177 U/L (ref 98–192)

## 2022-11-28 MED ORDER — HYDROMORPHONE HCL 1 MG/ML IJ SOLN
1.0000 mg | Freq: Once | INTRAMUSCULAR | Status: AC
  Administered 2022-11-28: 1 mg via INTRAVENOUS
  Filled 2022-11-28: qty 1

## 2022-11-28 NOTE — ED Notes (Signed)
Care called to transport patient to [facility] ED

## 2022-11-28 NOTE — ED Triage Notes (Signed)
Pt bib [facility] for CP.  PT is LVAD.  Has coordinator at [facility] and here.  Has sen Dr. Zoila Shutter many times.  PT has had intermittent CP that resolves with nitro. Today he took 4 nitro and 324 ASA with no relief.  EMS gave '8mg'$  of morphine with very little relief.

## 2022-11-28 NOTE — Progress Notes (Signed)
LVAD Coordinator ED Encounter  Chad [name] Record. a 61 y.o. male that presented to [facility] ER today due to chest pain. He has a past medical history  has a past medical history of AICD (automatic cardioverter/defibrillator) present, Anemia, Anginal pain ([place]), Anxiety, Aortic dissection, thoracoabdominal (HCC), Arthritis, Asthma, CAD (coronary artery disease), Carotid stenosis, Chest pain syndrome, CHF (congestive heart failure) ([place]), Chronic bronchitis ([place]), Chronic lower back pain, Chronic systolic heart failure ([place]), Complication of anesthesia, CRI (chronic renal insufficiency), Dyspnea, Family history of adverse reaction to anesthesia, GERD (gastroesophageal reflux disease), Gout, Heart murmur, HLD (hyperlipidemia), HTN (hypertension), Myocardial infarction ([place]), PONV (postoperative nausea and vomiting), Sleep apnea, and Thyroid cancer ([place]).  LVAD is a HM3 and was implanted at [facility]. IN[facility] managed by [facility].   Pt presented to the ER this morning complaining of severe chest pain. He endorses taking sublingual nitro and 324 ASA with no relief. Pt came to the ED 11/29 with same complaints. Asked ED to please contact [facility] VAD team. Pager number [phone].  CXR completed: Mild interstitial and ill-defined opacities within the left lung, which may reflect atelectasis and/or asymmetric edema.  Vital signs: HR: 85 NSR Doppler MAP: 100 Automated BP: 105/92 (98) O2 Sat: 100% SpO2  LVAD interrogation reveals:  Speed: 5650 Flow: 4.4 Power: 4.3 PI: 3.5  Alarms: none Events: 10-20 PI events daily  Drive Line: Drive line CDI maintained by his niece [name].  Significant Events with LVAD:   Updated VAD Providers (Dr.Bensimhon) about the above. No LVAD issues and pump is functioning as expected. Able to independently manage LVAD equipment. No LVAD needs at this time.    Bobbye Morton RN, BSN VAD Coordinator 24/7 Pager [phone]

## 2022-11-28 NOTE — ED Provider Notes (Signed)
[facility] EMERGENCY DEPARTMENT Provider Note   CSN: [id number] Arrival date & time: 11/28/22  5573     History Aortic dissection, CAD, Gout, HTN, LVAD No chief complaint on file.   Chad [name]. is a 61 y.o. male.  61 y.o male with a PMH of HTN, Anemia, Aortic dissection presents to the ED via EMS with a chief complaint of sudden onset of chest pain which woke him up this morning. He describes it as a stabbing pain to the left lower chest, which has been constant since it began. He took carvedilol at home while waiting for EMS. He was given ASA 325, nitro x 4 and 8 mg of morphine without any improvement in symptoms. He was evaluated on 11/29 with similar presentation but reports this pain is worst. He does have an LVAD in place and follow by [facility] team. No sob, no other complaints.   The history is provided by the patient and medical records.       Home Medications Prior to Admission medications   Medication Sig Start Date End Date Taking? Authorizing Provider  acetaminophen (TYLENOL) 325 MG tablet Take 2 tablets (650 mg total) by mouth every 4 (four) hours as needed for headache or mild pain. Patient not taking: Reported on 11/22/2022 05/03/20   Darrick Grinder D, NP  allopurinol (ZYLOPRIM) 100 MG tablet Take 1 tablet by mouth once daily 06/27/22   Bensimhon, Shaune Pascal, MD  amiodarone (PACERONE) 200 MG tablet Take 200 mg by mouth daily.    [provider]  amLODipine (NORVASC) 5 MG tablet Take 5 mg by mouth daily. 01/15/22   [provider]  carvedilol (COREG) 6.25 MG tablet Take 1 tablet (6.25 mg total) by mouth 2 (two) times daily with a meal. If systolic is less than 90 you may hold Coreg until systolic is greater than 90, then resume taking **Needs follow up appointment** 10/05/22   Bensimhon, Shaune Pascal, MD  colchicine 0.6 MG tablet Take 1 tablet by mouth once daily 11/21/22   Bensimhon, Shaune Pascal, MD  FARXIGA 5 MG TABS tablet Take 5 mg by mouth  every morning. 02/15/21   [provider]  ferrous sulfate 325 (65 FE) MG tablet Take 325 mg by mouth daily with breakfast.    [provider]  fluticasone (FLONASE) 50 MCG/ACT nasal spray Place 2 sprays into both nostrils daily as needed for allergies or rhinitis.     [provider]  gabapentin (NEURONTIN) 300 MG capsule Take 1 capsule (300 mg total) by mouth at bedtime. Patient taking differently: Take 300 mg by mouth 3 (three) times daily. 08/25/20   Bensimhon, Shaune Pascal, MD  hydrALAZINE (APRESOLINE) 100 MG tablet Take 50 mg by mouth 3 (three) times daily. 01/19/21   [provider]  isosorbide mononitrate (IMDUR) 30 MG 24 hr tablet Take 30 mg by mouth daily. 03/06/21   [provider]  naloxone [facility]) nasal spray 4 mg/0.1 mL Place 1 spray into the nose as needed (accidental overdose). Patient not taking: Reported on 11/06/2022    [provider]  nitroGLYCERIN (NITROSTAT) 0.4 MG SL tablet DISSOLVE ONE TABLET UNDER THE TONGUE EVERY 5 MINUTES AS NEEDED FOR CHEST PAIN.  MAY TAKE IF DIASTOLIC BP IS GREATER THAN 90 Patient taking differently: Place 0.4 mg under the tongue every 5 (five) minutes as needed for chest pain. 11/15/22   Bensimhon, Shaune Pascal, MD  Oxycodone HCl 20 MG TABS Take 20 mg by mouth every 6 (  six) hours. 12/22/19   [provider]  potassium chloride (KLOR-CON) 10 MEQ tablet Take 10 mEq by mouth daily as needed (fluid). Only take 1 if you take Torsemide    [provider]  sacubitril-valsartan (ENTRESTO) 49-51 MG Take 1 tablet by mouth 2 (two) times daily.    [provider]  torsemide (DEMADEX) 20 MG tablet Take 20 mg by mouth daily as needed (fluid). Take '20mg'$  with torsemide when needed.    [provider]  warfarin (COUMADIN) 4 MG tablet Take 4 mg by mouth See admin instructions. Take '4mg'$  every other night. Alternate with '5mg'$ .    [provider]  warfarin (COUMADIN) 5 MG tablet Take 5 mg  by mouth See admin instructions. Take '5mg'$  every other night. Alternate with '4mg'$  tablet.    [provider]      Allergies    Contrast media [iodinated contrast media], Iohexol, Lipitor [atorvastatin calcium], Shellfish allergy, Sulfa antibiotics, Sulfonamide derivatives, Almond oil, Metrizamide, Zocor [simvastatin], Food, Latex, and Peach [prunus persica]    Review of Systems   Review of Systems  Constitutional:  Negative for chills and fever.  HENT:  Negative for sore throat.   Respiratory:  Positive for shortness of breath.   Cardiovascular:  Positive for chest pain. Negative for leg swelling.  Gastrointestinal:  Negative for abdominal pain, diarrhea, nausea and vomiting.  Genitourinary:  Negative for flank pain.  Musculoskeletal:  Negative for back pain.  Neurological:  Negative for light-headedness and headaches.  All other systems reviewed and are negative.   Physical Exam Updated Vital Signs BP (!) 105/92   Pulse 79   Temp 98.4 F (36.9 C) (Oral)   Resp (!) 22   SpO2 100%  Physical Exam Vitals and nursing note reviewed.  Constitutional:      Appearance: He is ill-appearing.     Comments: Chronically ill appearing  HENT:     Nose: Nose normal.     Mouth/Throat:     Mouth: Mucous membranes are moist.  Eyes:     Pupils: Pupils are equal, round, and reactive to light.     Comments: Pupils are pinpoint but reactive and symmetric.   Pulmonary:     Effort: Pulmonary effort is normal.     Breath sounds: No wheezing or rales.  Abdominal:     General: Abdomen is flat.     Palpations: Abdomen is soft.     Tenderness: There is abdominal tenderness.  Musculoskeletal:     Cervical back: Normal range of motion and neck supple.  Skin:    General: Skin is warm and dry.  Neurological:     Mental Status: He is alert and oriented to person, place, and time.     ED Results / Procedures / Treatments   Labs (all labs ordered are listed, but only abnormal results are  displayed) Labs Reviewed  CBC WITH DIFFERENTIAL/PLATELET - Abnormal; Notable for the following components:      Result Value   WBC 3.6 (*)    Hemoglobin 12.0 (*)    HCT 38.7 (*)    MCH 25.8 (*)    RDW 16.5 (*)    All other components within normal limits  COMPREHENSIVE METABOLIC PANEL - Abnormal; Notable for the following components:   Glucose, Bld 108 (*)    BUN 21 (*)    Creatinine, Ser 1.59 (*)    Calcium 8.8 (*)    GFR, Estimated 50 (*)    All other components within normal  limits  PROTIME-INR - Abnormal; Notable for the following components:   Prothrombin Time 23.1 (*)    INR 2.1 (*)    All other components within normal limits  BRAIN NATRIURETIC PEPTIDE - Abnormal; Notable for the following components:   B Natriuretic Peptide 621.6 (*)    All other components within normal limits  TROPONIN I (HIGH SENSITIVITY) - Abnormal; Notable for the following components:   Troponin I (High Sensitivity) 25 (*)    All other components within normal limits  TROPONIN I (HIGH SENSITIVITY) - Abnormal; Notable for the following components:   Troponin I (High Sensitivity) 23 (*)    All other components within normal limits  LIPASE, BLOOD  LACTATE DEHYDROGENASE    EKG EKG Interpretation  Date/Time:  Tuesday November 28 2022 08:50:51 EST Ventricular Rate:  85 PR Interval:  220 QRS Duration: 103 QT Interval:  421 QTC Calculation: 489 R Axis:   189 Text Interpretation: Poor quality data, interpretation may be affected Sinus rhythm Multiform ventricular premature complexes LAE, consider biatrial enlargement Abnormal R-wave progression, late transition Inferior infarct, old Abnormal lateral Q waves Minimal ST elevation, anterior leads Artifact in lead(s) I II III aVR aVL aVF V1 V2 V3 V4 V5 V6 Confirmed by Dene Gentry [phone]) on 11/28/2022 8:52:48 AM  Radiology DG Chest Port 1 View  Result Date: 11/28/2022 CLINICAL DATA:  Provided history: Chest pain. EXAM: PORTABLE CHEST 1 VIEW  COMPARISON:  Prior chest radiographs 11/22/2022 and earlier. FINDINGS: Prior median sternotomy/CABG and cardiac valve replacement. Chronic cardiopericardial enlargement. Thoracic aortic aneurysm status post stent graft repair. Implantable cardiac device and left ventricular assist device again noted. Mild interstitial and ill-defined opacities within the left lung. No appreciable airspace consolidation on the right. No evidence of pleural effusion or pneumothorax. No acute bony abnormality identified. Surgical clips project in the region of the right axilla. IMPRESSION: Mild interstitial and ill-defined opacities within the left lung, which may reflect atelectasis and/or asymmetric edema. Background chronic findings without interval change, as described. Electronically Signed   By: Kellie Simmering D.O.   On: 11/28/2022 09:39    Procedures Procedures    Medications Ordered in ED Medications  HYDROmorphone (DILAUDID) injection 1 mg (1 mg Intravenous Given 11/28/22 0856)  HYDROmorphone (DILAUDID) injection 1 mg (1 mg Intravenous Given 11/28/22 1115)    ED Course/ Medical Decision Making/ A&P Clinical Course as of 11/28/22 1424  Tue Nov 28, 2022  1036 B Natriuretic Peptide(!): 621.6 BNP is within his baseline from 6 days ago [JS]  1036 INR(!): 2.1 Unchanged from 6 days [JS]    Clinical Course User Index [JS] Janeece Fitting, PA-C                           Medical Decision Making Amount and/or Complexity of Data Reviewed Labs: ordered. Decision-making details documented in ED Course. Radiology: ordered.  Risk Prescription drug management.   This patient presents to the ED for concern of chest pain, this involves a number of treatment options, and is a complaint that carries with it a high risk of complications and morbidity.  The differential diagnosis includes ACS, PE, CHF exacerbation versus infection.  Co morbidities: Discussed in HPI   Brief History:  Patient with LVAD since 2021  followed by [facility] system here with sudden onset of left sided chest pain, constant since it began, Failed to improve symptoms after ASA, nitro, morphine.   EMR reviewed including pt PMHx, past surgical history  and past visits to ER.   See HPI for more details   Lab Tests:  I ordered and independently interpreted labs.  The pertinent results include:    I personally reviewed all laboratory work and imaging. Metabolic panel without any acute abnormality specifically kidney function within normal limits and no significant electrolyte abnormalities. CBC without leukocytosis or significant anemia.  BMP is at his baseline, no signs of fluid overload on exam although abdomen is distended.  PT and INR are within the same limits as 6 days ago.  CBC with tubo leukopenia.  Troponin x 2 have been obtained first 125, trending down to 23.   Imaging Studies:  Chest xray: Mild interstitial and ill-defined opacities within the left lung,  which may reflect atelectasis and/or asymmetric edema.    Background chronic findings without interval change, as described.   Cardiac Monitoring:  The patient was maintained on a cardiac monitor.  I personally viewed and interpreted the cardiac monitored which showed an underlying rhythm of: NSR 85 EKG non-ischemic  Medicines ordered:  I ordered medication including dilaudid x 2  for pain control  Reevaluation of the patient after these medicines showed that the patient improved I have reviewed the patients home medicines and have made adjustments as needed   Critical Interventions:  Given dilaudid x 2, and is requesting Nitro drip in order to help with chest pain although his pain has improved after dilaudid.    Consults:  I requested consultation with Rennie Natter DNP at [facility] (LVAD team),  and discussed lab and imaging findings as well as pertinent plan - they recommend: trending troponins and checking back in with them.  Patient had  intervention by LVAD team while at [facility] and no LVAD needs at this time.   Reevaluation:  After the interventions noted above I re-evaluated patient and found that they have :improved   Social Determinants of Health:  The patient's social determinants of health were a factor in the care of this patient    Problem List / ED Course:  Patient under the care of LVAD team at [facility], presents to the ED via EMS for complaints of chest pain.  Hage had aspirin, nitro, morphine 8 mg without any improvement in pain.  His labs are at baseline from his prior visit.  Troponin for some was elevated at 25, trending down to 23.  Spoke to Jackquline Denmark over at [facility] who recommended touching base after patient's workup.  Patient continues to voice discomfort, although he is hemodynamically stable he is requesting a nitroglycerin drip in order to help with his chest pain.  X-ray with some mild congestion. Touching base with Rennie Natter DNP at [facility] to get obtain further recommendations.  I spoke to patient again, he is adamant he would like to receive care at [facility], call placed again to Rennie Natter, NP who agreed to ED to ED transfer.  Transfer center has been involved and accepting physician Dr.Spahillari is agreeable of patient going over to [facility] for continued care.  Dispostion:  After consideration of the diagnostic results and the patients response to treatment, I feel that the patent would benefit from transfer to original facility were he receives care.     Portions of this note were generated with [employer]. Dictation errors may occur despite best attempts at proofreading.   Final Clinical Impression(s) / ED Diagnoses Final diagnoses:  Chest pain, unspecified type  LVAD (left ventricular assist device) present (  [facility])    Rx / DC Orders ED Discharge Orders     None         Janeece Fitting, PA-C 11/28/22 1424    Valarie Merino, MD 11/29/22 [phone]

## 2022-12-01 ENCOUNTER — Encounter (HOSPITAL_COMMUNITY): Payer: Medicare Other | Admitting: Internal Medicine

## 2022-12-01 ENCOUNTER — Encounter (HOSPITAL_COMMUNITY): Payer: Medicare Other

## 2022-12-13 ENCOUNTER — Other Ambulatory Visit (HOSPITAL_COMMUNITY): Payer: Self-pay | Admitting: Internal Medicine

## 2022-12-13 DIAGNOSIS — Z95811 Presence of heart assist device: Secondary | ICD-10-CM

## 2022-12-13 DIAGNOSIS — I5042 Chronic combined systolic (congestive) and diastolic (congestive) heart failure: Secondary | ICD-10-CM

## 2022-12-26 LAB — CUP PACEART REMOTE DEVICE CHECK
Battery Remaining Longevity: 36 mo
Battery Remaining Percentage: 41 %
Brady Statistic RV Percent Paced: 0 %
Date Time Interrogation Session: 20231121153000
HighPow Impedance: 49 Ohm
Implantable Lead Connection Status: 753985
Implantable Lead Implant Date: 20171114
Implantable Lead Location: 753860
Implantable Lead Model: 181
Implantable Lead Serial Number: 333496
Implantable Pulse Generator Implant Date: 20131220
Lead Channel Impedance Value: 449 Ohm
Lead Channel Pacing Threshold Amplitude: 0.7 V
Lead Channel Pacing Threshold Pulse Width: 0.4 ms
Lead Channel Setting Pacing Amplitude: 2 V
Lead Channel Setting Pacing Pulse Width: 0.4 ms
Lead Channel Setting Sensing Sensitivity: 0.5 mV
Pulse Gen Serial Number: 124654
Zone Setting Status: 755011

## 2023-01-27 ENCOUNTER — Emergency Department (HOSPITAL_COMMUNITY): Payer: Medicare Other

## 2023-01-27 ENCOUNTER — Other Ambulatory Visit: Payer: Self-pay

## 2023-01-27 ENCOUNTER — Emergency Department (HOSPITAL_COMMUNITY)
Admission: EM | Admit: 2023-01-27 | Discharge: 2023-01-27 | Discharge status: Against Medical Advice | Payer: Medicare Other | Attending: Emergency Medicine | Admitting: Emergency Medicine

## 2023-01-27 DIAGNOSIS — R072 Precordial pain: Secondary | ICD-10-CM | POA: Diagnosis present

## 2023-01-27 DIAGNOSIS — R519 Headache, unspecified: Secondary | ICD-10-CM | POA: Diagnosis not present

## 2023-01-27 DIAGNOSIS — D6832 Hemorrhagic disorder due to extrinsic circulating anticoagulants: Secondary | ICD-10-CM

## 2023-01-27 DIAGNOSIS — Z91040 Latex allergy status: Secondary | ICD-10-CM | POA: Diagnosis not present

## 2023-01-27 DIAGNOSIS — R791 Abnormal coagulation profile: Secondary | ICD-10-CM | POA: Diagnosis not present

## 2023-01-27 DIAGNOSIS — Z79899 Other long term (current) drug therapy: Secondary | ICD-10-CM | POA: Diagnosis not present

## 2023-01-27 DIAGNOSIS — Z7902 Long term (current) use of antithrombotics/antiplatelets: Secondary | ICD-10-CM | POA: Diagnosis not present

## 2023-01-27 DIAGNOSIS — D696 Thrombocytopenia, unspecified: Secondary | ICD-10-CM | POA: Diagnosis not present

## 2023-01-27 LAB — TROPONIN I (HIGH SENSITIVITY)
Troponin I (High Sensitivity): 12 ng/L (ref <18)
Troponin I (High Sensitivity): 18 ng/L — ABNORMAL HIGH (ref <18)

## 2023-01-27 LAB — COMPREHENSIVE METABOLIC PANEL WITH GFR
ALT: 11 U/L (ref 0–44)
AST: 13 U/L — ABNORMAL LOW (ref 15–41)
Albumin: 3.7 g/dL (ref 3.5–5.0)
Alkaline Phosphatase: 64 U/L (ref 38–126)
Anion gap: 11 (ref 5–15)
BUN: 20 mg/dL (ref 6–20)
CO2: 24 mmol/L (ref 22–32)
Calcium: 8.5 mg/dL — ABNORMAL LOW (ref 8.9–10.3)
Chloride: 98 mmol/L (ref 98–111)
Creatinine, Ser: 1.9 mg/dL — ABNORMAL HIGH (ref 0.61–1.24)
GFR, Estimated: 40 mL/min — ABNORMAL LOW
Glucose, Bld: 112 mg/dL — ABNORMAL HIGH (ref 70–99)
Potassium: 4 mmol/L (ref 3.5–5.1)
Sodium: 133 mmol/L — ABNORMAL LOW (ref 135–145)
Total Bilirubin: 0.5 mg/dL (ref 0.3–1.2)
Total Protein: 6.3 g/dL — ABNORMAL LOW (ref 6.5–8.1)

## 2023-01-27 LAB — PROTIME-INR
INR: 5.7 (ref 0.8–1.2)
Prothrombin Time: 51 s — ABNORMAL HIGH (ref 11.4–15.2)

## 2023-01-27 LAB — CBC
HCT: 33.9 % — ABNORMAL LOW (ref 39.0–52.0)
Hemoglobin: 10.7 g/dL — ABNORMAL LOW (ref 13.0–17.0)
MCH: 25.4 pg — ABNORMAL LOW (ref 26.0–34.0)
MCHC: 31.6 g/dL (ref 30.0–36.0)
MCV: 80.3 fL (ref 80.0–100.0)
Platelets: 148 K/uL — ABNORMAL LOW (ref 150–400)
RBC: 4.22 MIL/uL (ref 4.22–5.81)
RDW: 15.9 % — ABNORMAL HIGH (ref 11.5–15.5)
WBC: 5.3 K/uL (ref 4.0–10.5)
nRBC: 0 % (ref 0.0–0.2)

## 2023-01-27 MED ORDER — ALUM & MAG HYDROXIDE-SIMETH 200-200-20 MG/5ML PO SUSP
30.0000 mL | Freq: Once | ORAL | Status: DC
  Filled 2023-01-27: qty 30

## 2023-01-27 MED ORDER — ONDANSETRON HCL 4 MG/2ML IJ SOLN
4.0000 mg | Freq: Once | INTRAMUSCULAR | Status: AC
  Administered 2023-01-27: 4 mg via INTRAVENOUS
  Filled 2023-01-27: qty 2

## 2023-01-27 MED ORDER — FAMOTIDINE 20 MG PO TABS
20.0000 mg | ORAL_TABLET | Freq: Once | ORAL | Status: DC
  Filled 2023-01-27: qty 1

## 2023-01-27 MED ORDER — ACETAMINOPHEN 500 MG PO TABS
1000.0000 mg | ORAL_TABLET | Freq: Once | ORAL | Status: DC
  Filled 2023-01-27: qty 2

## 2023-01-27 MED ORDER — HYDROMORPHONE HCL 1 MG/ML IJ SOLN
0.5000 mg | Freq: Once | INTRAMUSCULAR | Status: AC
  Administered 2023-01-27: 0.5 mg via INTRAVENOUS
  Filled 2023-01-27: qty 1

## 2023-01-27 MED ORDER — HYDROMORPHONE HCL 1 MG/ML IJ SOLN: 0.5000 mg | Freq: Once | INTRAMUSCULAR | Status: DC

## 2023-01-27 NOTE — ED Provider Notes (Signed)
[facility] Provider Note   CSN: [id number] Arrival date & time: 01/27/23  1441     History {Add pertinent medical, surgical, social history, OB history to HPI:1} No chief complaint on file.   Sten Laurice Record. is a 62 y.o. male.  Pt with hx ICM, LVAD device for ~ past 3 years, c/o having mid chest pressure/heaviness in the past few hours. Symptoms at rest, constant, non radiating, not pleuritic. No sob, nv or diaphoresis. Indicates has similar pain frequently. Denies leg pain or swelling. No faintness. Denies cough or uri symptoms. No fever or chills. States compliant w normal meds. Since in ED also noted dull frontal headache, similar headache in past. No neck pain or stiffness. No change in speech or vision. No numbness/weakness. No sinus pain/drainage.   The history is provided by the patient, medical records, a relative and the EMS personnel.       Home Medications Prior to Admission medications   Medication Sig Start Date End Date Taking? Authorizing Provider  acetaminophen (TYLENOL) 325 MG tablet Take 2 tablets (650 mg total) by mouth every 4 (four) hours as needed for headache or mild pain. Patient not taking: Reported on 11/22/2022 05/03/20   Darrick Grinder D, NP  allopurinol (ZYLOPRIM) 100 MG tablet Take 1 tablet by mouth once daily 06/27/22   Bensimhon, Shaune Pascal, MD  amiodarone (PACERONE) 200 MG tablet Take 200 mg by mouth daily.    [provider]  amLODipine (NORVASC) 5 MG tablet Take 5 mg by mouth daily. 01/15/22   [provider]  carvedilol (COREG) 6.25 MG tablet Take 1 tablet (6.25 mg total) by mouth 2 (two) times daily with a meal. If systolic is less than 90 you may hold Coreg until systolic is greater than 90, then resume taking **Needs follow up appointment** 10/05/22   Bensimhon, Shaune Pascal, MD  colchicine 0.6 MG tablet Take 1 tablet by mouth once daily 11/21/22   Bensimhon, Shaune Pascal, MD  FARXIGA 5 MG TABS  tablet Take 5 mg by mouth every morning. 02/15/21   [provider]  ferrous sulfate 325 (65 FE) MG tablet Take 325 mg by mouth daily with breakfast.    [provider]  fluticasone (FLONASE) 50 MCG/ACT nasal spray Place 2 sprays into both nostrils daily as needed for allergies or rhinitis.     [provider]  gabapentin (NEURONTIN) 300 MG capsule Take 1 capsule (300 mg total) by mouth at bedtime. Patient taking differently: Take 300 mg by mouth 3 (three) times daily. 08/25/20   Bensimhon, Shaune Pascal, MD  hydrALAZINE (APRESOLINE) 100 MG tablet Take 50 mg by mouth 3 (three) times daily. 01/19/21   [provider]  isosorbide mononitrate (IMDUR) 30 MG 24 hr tablet Take 30 mg by mouth daily. 03/06/21   [provider]  naloxone [facility]) nasal spray 4 mg/0.1 mL Place 1 spray into the nose as needed (accidental overdose). Patient not taking: Reported on 11/06/2022    [provider]  nitroGLYCERIN (NITROSTAT) 0.4 MG SL tablet DISSOLVE ONE TABLET UNDER THE TONGUE EVERY 5 MINUTES AS NEEDED FOR CHEST PAIN.  MAY TAKE IF DIASTOLIC BP IS GREATER THAN 90. 12/14/22   Bensimhon, Shaune Pascal, MD  Oxycodone HCl 20 MG TABS Take 20 mg by mouth every 6 (six) hours. 12/22/19   [provider]  potassium chloride (KLOR-CON) 10 MEQ tablet Take 10 mEq by mouth daily as needed (fluid). Only take 1 if you  take Torsemide    [provider]  sacubitril-valsartan (ENTRESTO) 49-51 MG Take 1 tablet by mouth 2 (two) times daily.    [provider]  torsemide (DEMADEX) 20 MG tablet Take 20 mg by mouth daily as needed (fluid). Take '20mg'$  with torsemide when needed.    [provider]  warfarin (COUMADIN) 4 MG tablet Take 4 mg by mouth See admin instructions. Take '4mg'$  every other night. Alternate with '5mg'$ .    [provider]  warfarin (COUMADIN) 5 MG tablet Take 5 mg by mouth See admin instructions. Take '5mg'$  every other night. Alternate with '4mg'$   tablet.    [provider]      Allergies    Contrast media [iodinated contrast media], Iohexol, Lipitor [atorvastatin calcium], Shellfish allergy, Sulfa antibiotics, Sulfonamide derivatives, Almond oil, Metrizamide, Zocor [simvastatin], Food, Latex, and Peach [prunus persica]    Review of Systems   Review of Systems  Constitutional:  Negative for fever.  HENT:  Negative for sore throat.   Eyes:  Negative for visual disturbance.  Respiratory:  Negative for shortness of breath.   Cardiovascular:  Positive for chest pain. Negative for palpitations and leg swelling.  Gastrointestinal:  Negative for abdominal pain and vomiting.  Genitourinary:  Negative for dysuria and flank pain.  Musculoskeletal:  Negative for back pain and neck pain.  Skin:  Negative for rash.  Neurological:  Negative for weakness and numbness.  Hematological:  Does not bruise/bleed easily.  Psychiatric/Behavioral:  Negative for confusion.     Physical Exam Updated Vital Signs BP (!) 122/96   Pulse 65   Resp 13   SpO2 94%  Physical Exam Vitals and nursing note reviewed.  Constitutional:      Appearance: Normal appearance. He is well-developed.  HENT:     Head: Atraumatic.     Nose: Nose normal.     Mouth/Throat:     Mouth: Mucous membranes are moist.  Eyes:     General: No scleral icterus.    Conjunctiva/sclera: Conjunctivae normal.     Pupils: Pupils are equal, round, and reactive to light.  Neck:     Trachea: No tracheal deviation.     Comments: No stiffness or rigidity.  Cardiovascular:     Rate and Rhythm: Normal rate and regular rhythm.     Heart sounds:     No friction rub.     Comments: LVAD hum Pulmonary:     Effort: Pulmonary effort is normal. No accessory muscle usage or respiratory distress.     Breath sounds: Normal breath sounds.  Abdominal:     General: Bowel sounds are normal. There is no distension.     Palpations: Abdomen is soft.     Tenderness: There is no abdominal  tenderness.  Genitourinary:    Comments: No cva tenderness. Musculoskeletal:        General: No swelling or tenderness.     Cervical back: Normal range of motion and neck supple. No rigidity.     Right lower leg: No edema.     Left lower leg: No edema.  Skin:    General: Skin is warm and dry.     Findings: No rash.  Neurological:     Mental Status: He is alert.     Comments: Alert, speech clear. Motor/sens grossly intact bil.   Psychiatric:        Mood and Affect: Mood normal.     ED Results / Procedures / Treatments   Labs (all labs ordered  are listed, but only abnormal results are displayed) Results for orders placed or performed during the hospital encounter of 01/27/23  CBC  Result Value Ref Range   WBC 5.3 4.0 - 10.5 K/uL   RBC 4.22 4.22 - 5.81 MIL/uL   Hemoglobin 10.7 (L) 13.0 - 17.0 g/dL   HCT 33.9 (L) 39.0 - 52.0 %   MCV 80.3 80.0 - 100.0 fL   MCH 25.4 (L) 26.0 - 34.0 pg   MCHC 31.6 30.0 - 36.0 g/dL   RDW 15.9 (H) 11.5 - 15.5 %   Platelets 148 (L) 150 - 400 K/uL   nRBC 0.0 0.0 - 0.2 %  Comprehensive metabolic panel  Result Value Ref Range   Sodium 133 (L) 135 - 145 mmol/L   Potassium 4.0 3.5 - 5.1 mmol/L   Chloride 98 98 - 111 mmol/L   CO2 24 22 - 32 mmol/L   Glucose, Bld 112 (H) 70 - 99 mg/dL   BUN 20 6 - 20 mg/dL   Creatinine, Ser 1.90 (H) 0.61 - 1.24 mg/dL   Calcium 8.5 (L) 8.9 - 10.3 mg/dL   Total Protein 6.3 (L) 6.5 - 8.1 g/dL   Albumin 3.7 3.5 - 5.0 g/dL   AST 13 (L) 15 - 41 U/L   ALT 11 0 - 44 U/L   Alkaline Phosphatase 64 38 - 126 U/L   Total Bilirubin 0.5 0.3 - 1.2 mg/dL   GFR, Estimated 40 (L) >60 mL/min   Anion gap 11 5 - 15  Protime-INR  Result Value Ref Range   Prothrombin Time 51.0 (H) 11.4 - 15.2 seconds   INR 5.7 (HH) 0.8 - 1.2  Troponin I (High Sensitivity)  Result Value Ref Range   Troponin I (High Sensitivity) 12 <18 ng/L   *Note: Due to a large number of results and/or encounters for the requested time period, some results have  not been displayed. A complete set of results can be found in Results Review.   DG Chest Port 1 View  Result Date: 01/27/2023 CLINICAL DATA:  Pain EXAM: PORTABLE CHEST 1 VIEW COMPARISON:  11/28/2022 FINDINGS: Gross cardiomegaly with LVAD and left chest single lead pacer defibrillator. 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 No acute airspace opacity. Osseous structures unremarkable. IMPRESSION: 1.  No acute abnormality of the lungs. 2. Gross cardiomegaly with LVAD and left chest single lead pacer defibrillator. 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 Electronically Signed   By: Delanna Ahmadi M.D.   On: 01/27/2023 15:38    EKG EKG Interpretation  Date/Time:  Saturday January 27 2023 14:50:59 EST Ventricular Rate:  70 PR Interval:  190 QRS Duration: 85 QT Interval:  433 QTC Calculation: 468 R Axis:   169 Text Interpretation: Sinus rhythm `LVAD Artifact Confirmed by Lajean Saver [phone]) on 01/27/2023 3:05:43 PM  Radiology DG Chest Port 1 View  Result Date: 01/27/2023 CLINICAL DATA:  Pain EXAM: PORTABLE CHEST 1 VIEW COMPARISON:  11/28/2022 FINDINGS: Gross cardiomegaly with LVAD and left chest single lead pacer defibrillator. 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 No acute airspace opacity. Osseous structures unremarkable. IMPRESSION: 1.  No acute abnormality of the lungs. 2. Johney Maine  cardiomegaly with LVAD and left chest single lead pacer defibrillator. Status post median sternotomy and CABG with stent endograft repair of the aortic arch and descending thoracic aorta and unchanged aneurysmal appearance of the  aortic arch. Electronically Signed   By: Delanna Ahmadi M.D.   On: 01/27/2023 15:38    Procedures Procedures  {Document cardiac monitor, telemetry assessment procedure when appropriate:1}  Medications Ordered in ED Medications  alum & mag hydroxide-simeth (MAALOX/MYLANTA) 200-200-20 MG/5ML suspension 30 mL (has no administration in time range)  famotidine (PEPCID) tablet 20 mg (has no administration in time range)  acetaminophen (TYLENOL) tablet 1,000 mg (has no administration in time range)  HYDROmorphone (DILAUDID) injection 0.5 mg (0.5 mg Intravenous Given 01/27/23 1613)  ondansetron (ZOFRAN) injection 4 mg (4 mg Intravenous Given 01/27/23 1614)    ED Course/ Medical Decision Making/ A&P   {   Click here for ABCD2, HEART and other calculatorsREFRESH Note before signing :1}                          Medical Decision Making Amount and/or Complexity of Data Reviewed Labs: ordered. Radiology: ordered.  Risk OTC drugs. Prescription drug management.   Iv ns. Continuous pulse ox and cardiac monitoring. Labs ordered/sent. Imaging ordered.   Differential diagnosis includes ACS, MSK cp, GI cp, etc . Dispo decision including potential need for admission considered - will get labs and imaging and reassess.   Reviewed nursing notes and prior charts for additional history. External reports reviewed. Additional history from: EMS/family.  Cardiac monitor: sinus rhythm, rate 66.  Labs reviewed/interpreted by me - initial trop normal.   Xrays reviewed/interpreted by me - no pna.   Cardiology/lvad consulted.  Our Lvad coordinator, Dominica Severin, indicates to call the [facility] LVAD coordinator, Blue, to discuss pt.  [facility] LVAD coordinator contacted, pt well known to them, recent eval there for chest discomfort - they indicate pt with periodic/continued chest pain, and that not candidate for further intervention, and that they manage pain w meds/imdur. Records indicate recent ct imaging chest in past few  months for similar pain without acute abn, and notes indicate rec to limit repetitive cta as hx renal insuff. Also discussed high PT today.  They indicate feel if trop x 2 normal, that pt can be d/c to home to f/u with them, hold coumadin x 2 days.   Recheck pain improved. Delta trop pending.   Recheck pt, no headache. No sob. Chest discomfort markedly improved. Await delta trop.  Additional labs reviewed/interpreted by me  - delta trop normal.  Will plan d/c per plan discussed w [facility] LVAD, hold coumadin today and tomorrow, rec f/u there early this week.   Return precautions provided.       {Document critical care time when appropriate:1} {Document review of labs and clinical decision tools ie heart score, Chads2Vasc2 etc:1}  {Document your independent review of radiology images, and any outside records:1} {Document your discussion with family members, caretakers, and with consultants:1} {Document social determinants of health affecting pt's care:1} {Document your decision making why or why not admission, treatments were needed:1} Final Clinical Impression(s) / ED Diagnoses Final diagnoses:  None    Rx / DC Orders ED Discharge Orders     None

## 2023-01-27 NOTE — ED Notes (Addendum)
RN went in room around 1945 to give pt his medication and he wasn't concerned with taking the medication at that time. RN attempted three times to give pt his medicine and the pt was talking on the phone and was not concerned with taking the medication. Pt was more concerned with the LVAD and when they were coming. MD Steinl walked in while RN was in the room and explained to the pt that the LVAD team was not here today and that he was reaching out to another hospital for their LVAD team and that he was waiting to hear back from them. RN told pt that they would be back to give the medicine. RN goes to check on pt at 2030 and finds out that the pt has left. Pt still had an IV in his L arm.

## 2023-01-27 NOTE — ED Notes (Signed)
This EMT observed that the patient's oxygen saturation was reading in th mid 70s, but the waveform was irregular. This EMT went to the patient's room and placed the patient on 2 liters of oxygen with improvement. The patient stated that his head was hurting. RN informed of incident.

## 2023-01-27 NOTE — ED Notes (Addendum)
Pt has eloped MD Ashok Cordia and charge nurse notified.

## 2023-01-27 NOTE — ED Triage Notes (Signed)
Pt was sitting in church and started having 9/10 crushing chest pain. Patient took 2-324 aspirin and 2 sublingual nitro prior to EMS arrival with no relief. Pt is Aox4. Pt has no N/V/D. Pt had slight SHOB, however chief complaint is chest pain.   LVAD has good hum, no alarms sounding, and spare batteries are at bedside.   LVS 146/117  98% on RA  HR 75 - regular  Cbg 118   No additional meds given by EMS  18g placed in Left AC.

## 2023-01-27 NOTE — Discharge Instructions (Addendum)
It was our pleasure to provide your ER care today - we hope that you feel better.  Your INR test is high, 5.7 - we discussed this with your LVAD team and they indicate for you to hold your dose today and tomorrow and to then re-start, and to follow up with them closely.  Follow up with your LVAD team Monday.   Return to ER if worse, new symptoms, fevers, recurrent/persistent chest pain, new/severe pain, trouble breathing, weak/fainting, or other concern.

## 2023-01-29 ENCOUNTER — Telehealth: Payer: Self-pay | Admitting: *Deleted

## 2023-02-03 ENCOUNTER — Telehealth (HOSPITAL_COMMUNITY): Payer: Self-pay

## 2023-02-03 ENCOUNTER — Telehealth: Payer: Self-pay | Admitting: Cardiology

## 2023-02-03 NOTE — Telephone Encounter (Signed)
Patient's daughter, Gerrit Halls, contacted the answering service this AM requesting that Dr. Haroldine Laws sign the death certificate for the patient. Per chart review, patient last saw Dr. Haroldine Laws in 11/22/22 while admitted to the hospital. Since then, he has been followed by [facility]. Also followed by [facility]'s LVAD team. He had an office appointment with [facility] on 12/26/22 and there are several phone notes since then. He was also seen in the [facility] ED/admitted to [facility] on 11/28/22, 12/03/22, 12/11/22, 01/05/23, 01/10/23. I offered my condolences, but I explained to patient's daughter that since the patient has been admitted to [facility] and seen [facility] cardiology multiple times since being seen by Dr. Haroldine Laws, it would be more appropriate for a [facility] physician to sigh the death certificate. Explained further that since patient has not seen Dr. Haroldine Laws since 10/2022, there are multiple months of medical care that Dr. Kendrick Ranch would be unfamiliar with. Encouraged patient to contact [facility] or [facility] LVAD to have the death certificate signed. Patient's daughter reported that there is a doctor signing death forms, Dr. Jerilee Hoh, but the patient did not routinely see that provider so she does not want him to sign the forms. She would prefer Dr. Haroldine Laws to sign the forms since he knew the patient better. I again explained that Dr. Haroldine Laws has not seen the patient in a few months, and the patient has been admitted to [facility] and has been seen by [facility] since then, they should sign the certificate as they are more familiar with his care. I discussed with Dr. Aundra Dubin who was in agreement that [facility] should be responsible. Dr. Aundra Dubin has not seen patient since 01/2022.  Daughter was very frustrated with this answer. She reported that patient was seen at [facility] ED on 2/3. I reviewed the notes, and it appears that patient presented with complaints of chest pain. ED provider discussed the case with  [facility] LVAD coordinator. When ED provider went to reevaluate patient, he had left AMA. I discussed with daughter that the patient had left from the ED before cardiology was involved with his care. Daughter stated that patient left because "he had been there for 8 hours and no one was taking care of him." Also reported that "if he had stayed there, you all would have killed him anyway." Daughter was very frustrated with the care patient received in the ED. Asked if Dr. Ashok Cordia from the ED would sign the death certificate, but I unfortunately did not know how to get ahold of him. I informed daughter that she should contact the advanced heart failure clinic on Monday if she wanted further assistance. Daughter refused and stated that "this was suppose to be taken care of 5 days ago" and that she wants to leave [place] to go home. I apologized that this process has been complicated and has been frustrating for her, and again offered my condolences for her loss. Daughter hung up on me.  I contacted our LVAD coordinator, Emerson Monte, to see if she had any recommendations. She reported that our LVAD team had not received the death certificate, and while it is possible it went to Dr. Haroldine Laws, it is much more likely that the certificate has been sent to [facility] cardiology or [facility] LVAD team. Ebony Hail reported that it would be best for the daughter to contact their team for further assistance as Dr. Haroldine Laws is not on call.   I contacted Gerrit Halls to try pass along the recommendations from [facility]. Daughter  told me that she is at the hospital and will be getting the certificate signed today. States that she "told her father that she didn't want to be on the earth herself" a few days ago, and his death has been very hard on her. Daughter reports that she just want this taken care of. I offered her the phone number for our advanced heart failure clinic to call on Monday or the hospital's main line to get in touch with a  provider who saw the patient more recently, but the daughter declined. She said she was sick of trying to take care of it on the phone and will be handling it in person in the hospital today. Daughter hung up on me  As patient was very upset on the phone, I contacted the ED so that they could have security ready in case.

## 2023-02-07 ENCOUNTER — Encounter (HOSPITAL_COMMUNITY): Payer: Medicare Other | Admitting: Internal Medicine

## 2023-02-08 ENCOUNTER — Encounter (HOSPITAL_COMMUNITY): Payer: Medicare Other | Admitting: Internal Medicine

## 2023-02-23 NOTE — Progress Notes (Signed)
error 

## 2023-02-23 NOTE — Progress Notes (Signed)
01/27/23 1508: VAD Coordinator received page from patient's nurse notifying of arrived to the ED for chest pain. EKG obtained NSR in the 70's labs pending. RN states there are no active alarms on VAD. VAD Coordinator asked nurse to page rapid response to hook patient up to VAD cart. Asked to page [facility] VAD Coordinator as they are his primary [place]. Pager number provided. Dr. Haroldine Laws made aware of patient's arrival.  01/27/23 1804: VAD Coordinator called ED physician Dr. Ashok Cordia to get update on patient's status and see if assistance was needed for [name] VAD team. Dr. Ashok Cordia  made aware to reach out to [facility] VAD Coordinator as they are primary [place] pager number provided. MD reached out to Rennie Natter NP. Decision made to trend troponin x 2 and hold coumadin for two days. NP stated if troponin trends down patient can discharge home. Dr. Haroldine Laws made aware of plan from [facility]. Labs and CT results reviewed with MD.   Chad Morton RN, BSN VAD Coordinator 24/7 Pager [phone]

## 2023-02-23 NOTE — Telephone Encounter (Signed)
Pt's niece called heart failure clinic to report pt had expired over the weekend. Pt was seen at [facility] ER over the weekend for chest pain. [facility] VAD team involved with care planning while pt in ER. He left without telling staff between [phone] with a PIV in his arm. [facility] VAD team unaware pt had eloped.   Spoke with pt's niece [name] this morning. She reported that pt was found dead at home this morning by his nurse tech. Pt was in his recliner on batteries. Pump was off. EMS was called to the scene and pronounced pt dead. Expressed our condolences for her loss.   Emerson Monte RN [place] Coordinator  Office: [phone]  24/7 Pager: [phone]

## 2023-02-23 DEATH — deceased
